# Patient Record
Sex: Female | Born: 1965
Health system: Southern US, Community
[De-identification: ages and names within clinical notes are randomized; demographics above are authoritative.]

## PROBLEM LIST (undated history)

## (undated) DIAGNOSIS — K589 Irritable bowel syndrome without diarrhea: Secondary | ICD-10-CM

## (undated) DIAGNOSIS — K760 Fatty (change of) liver, not elsewhere classified: Secondary | ICD-10-CM

## (undated) DIAGNOSIS — K8020 Calculus of gallbladder without cholecystitis without obstruction: Secondary | ICD-10-CM

## (undated) DIAGNOSIS — N83209 Unspecified ovarian cyst, unspecified side: Secondary | ICD-10-CM

## (undated) DIAGNOSIS — E119 Type 2 diabetes mellitus without complications: Secondary | ICD-10-CM

## (undated) DIAGNOSIS — K5792 Diverticulitis of intestine, part unspecified, without perforation or abscess without bleeding: Secondary | ICD-10-CM

## (undated) DIAGNOSIS — J45909 Unspecified asthma, uncomplicated: Secondary | ICD-10-CM

## (undated) HISTORY — DX: Fatty (change of) liver, not elsewhere classified: K76.0

## (undated) HISTORY — PX: ABDOMINAL HYSTERECTOMY: SHX81

## (undated) HISTORY — PX: COLONOSCOPY: SHX174

## (undated) HISTORY — DX: Calculus of gallbladder without cholecystitis without obstruction: K80.20

## (undated) HISTORY — PX: CHOLECYSTECTOMY: SHX55

## (undated) HISTORY — DX: Irritable bowel syndrome, unspecified: K58.9

---

## 1999-12-02 ENCOUNTER — Emergency Department (HOSPITAL_COMMUNITY): Admission: EM | Admit: 1999-12-02 | Discharge: 1999-12-02 | Payer: Self-pay | Admitting: Emergency Medicine

## 1999-12-04 ENCOUNTER — Encounter (HOSPITAL_BASED_OUTPATIENT_CLINIC_OR_DEPARTMENT_OTHER): Payer: Self-pay | Admitting: General Surgery

## 1999-12-04 ENCOUNTER — Ambulatory Visit (HOSPITAL_COMMUNITY): Admission: RE | Admit: 1999-12-04 | Discharge: 1999-12-05 | Payer: Self-pay | Admitting: General Surgery

## 2000-09-01 ENCOUNTER — Encounter: Payer: Self-pay | Admitting: Emergency Medicine

## 2000-09-01 ENCOUNTER — Emergency Department (HOSPITAL_COMMUNITY): Admission: EM | Admit: 2000-09-01 | Discharge: 2000-09-01 | Payer: Self-pay | Admitting: Emergency Medicine

## 2000-12-13 ENCOUNTER — Encounter: Payer: Self-pay | Admitting: Internal Medicine

## 2000-12-13 ENCOUNTER — Emergency Department (HOSPITAL_COMMUNITY): Admission: EM | Admit: 2000-12-13 | Discharge: 2000-12-13 | Payer: Self-pay | Admitting: Emergency Medicine

## 2001-01-17 ENCOUNTER — Ambulatory Visit (HOSPITAL_COMMUNITY): Admission: RE | Admit: 2001-01-17 | Discharge: 2001-01-17 | Payer: Self-pay | Admitting: Gastroenterology

## 2001-01-21 ENCOUNTER — Ambulatory Visit (HOSPITAL_COMMUNITY): Admission: RE | Admit: 2001-01-21 | Discharge: 2001-01-21 | Payer: Self-pay | Admitting: Gastroenterology

## 2001-01-21 ENCOUNTER — Encounter: Payer: Self-pay | Admitting: Gastroenterology

## 2001-02-16 ENCOUNTER — Other Ambulatory Visit: Admission: RE | Admit: 2001-02-16 | Discharge: 2001-02-16 | Payer: Self-pay | Admitting: Obstetrics and Gynecology

## 2001-02-25 ENCOUNTER — Encounter: Admission: RE | Admit: 2001-02-25 | Discharge: 2001-02-25 | Payer: Self-pay | Admitting: Obstetrics and Gynecology

## 2001-02-25 ENCOUNTER — Encounter: Payer: Self-pay | Admitting: Obstetrics and Gynecology

## 2001-08-10 ENCOUNTER — Other Ambulatory Visit: Admission: RE | Admit: 2001-08-10 | Discharge: 2001-08-10 | Payer: Self-pay | Admitting: Gynecology

## 2001-09-05 ENCOUNTER — Ambulatory Visit (HOSPITAL_COMMUNITY): Admission: RE | Admit: 2001-09-05 | Discharge: 2001-09-05 | Payer: Self-pay | Admitting: Gynecology

## 2002-02-09 ENCOUNTER — Inpatient Hospital Stay (HOSPITAL_COMMUNITY): Admission: RE | Admit: 2002-02-09 | Discharge: 2002-02-11 | Payer: Self-pay | Admitting: Gynecology

## 2002-02-15 ENCOUNTER — Observation Stay (HOSPITAL_COMMUNITY): Admission: AD | Admit: 2002-02-15 | Discharge: 2002-02-16 | Payer: Self-pay | Admitting: Gynecology

## 2002-11-15 ENCOUNTER — Encounter: Admission: RE | Admit: 2002-11-15 | Discharge: 2003-01-24 | Payer: Self-pay | Admitting: Orthopedic Surgery

## 2003-09-20 ENCOUNTER — Ambulatory Visit (HOSPITAL_COMMUNITY): Admission: RE | Admit: 2003-09-20 | Discharge: 2003-09-21 | Payer: Self-pay | Admitting: Neurosurgery

## 2005-05-10 IMAGING — CR DG CHEST 2V
2 series · 2 of 2 positions shown · non-contrast
Comparison: 12/04/1999.

CLINICAL DATA: Preop herniated nucleus pulposus.  
 CHEST (TWO VIEWS) ? 09/18/2003 AT 6033 HOURS

[view not recorded (1 of 2)]
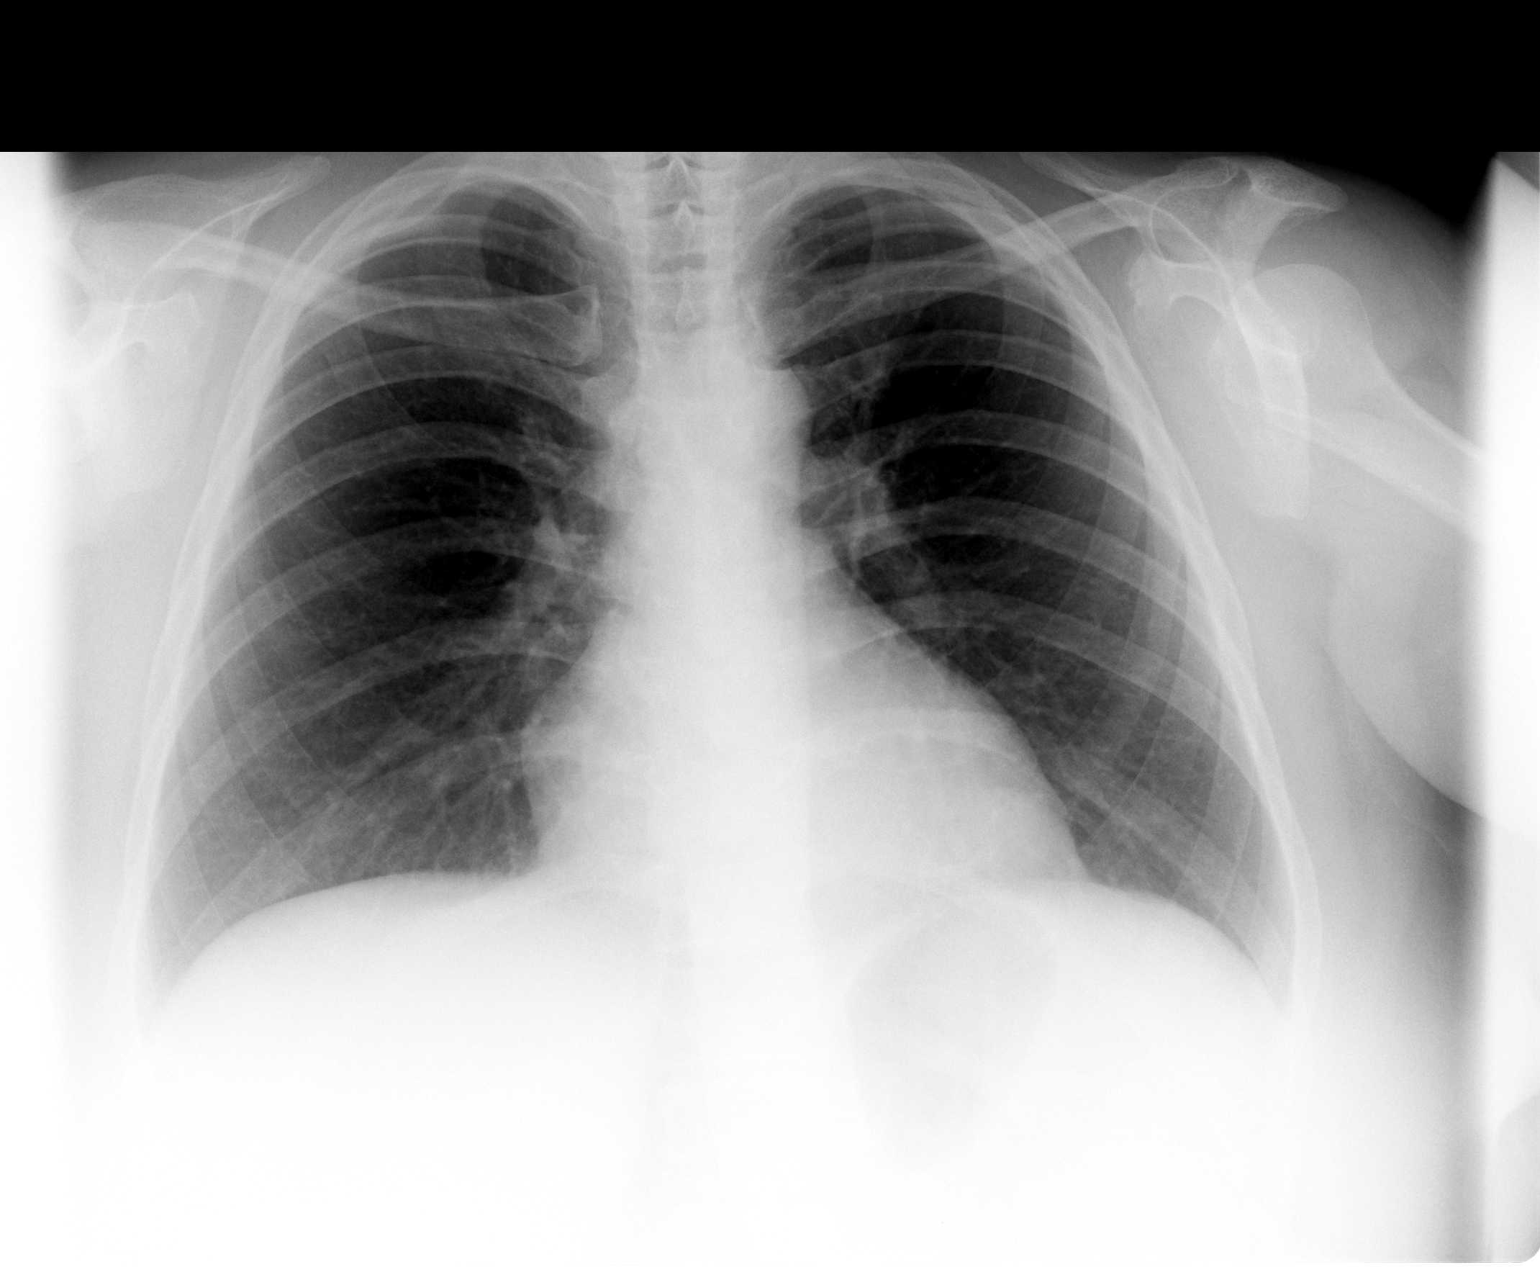

[view not recorded (2 of 2)]
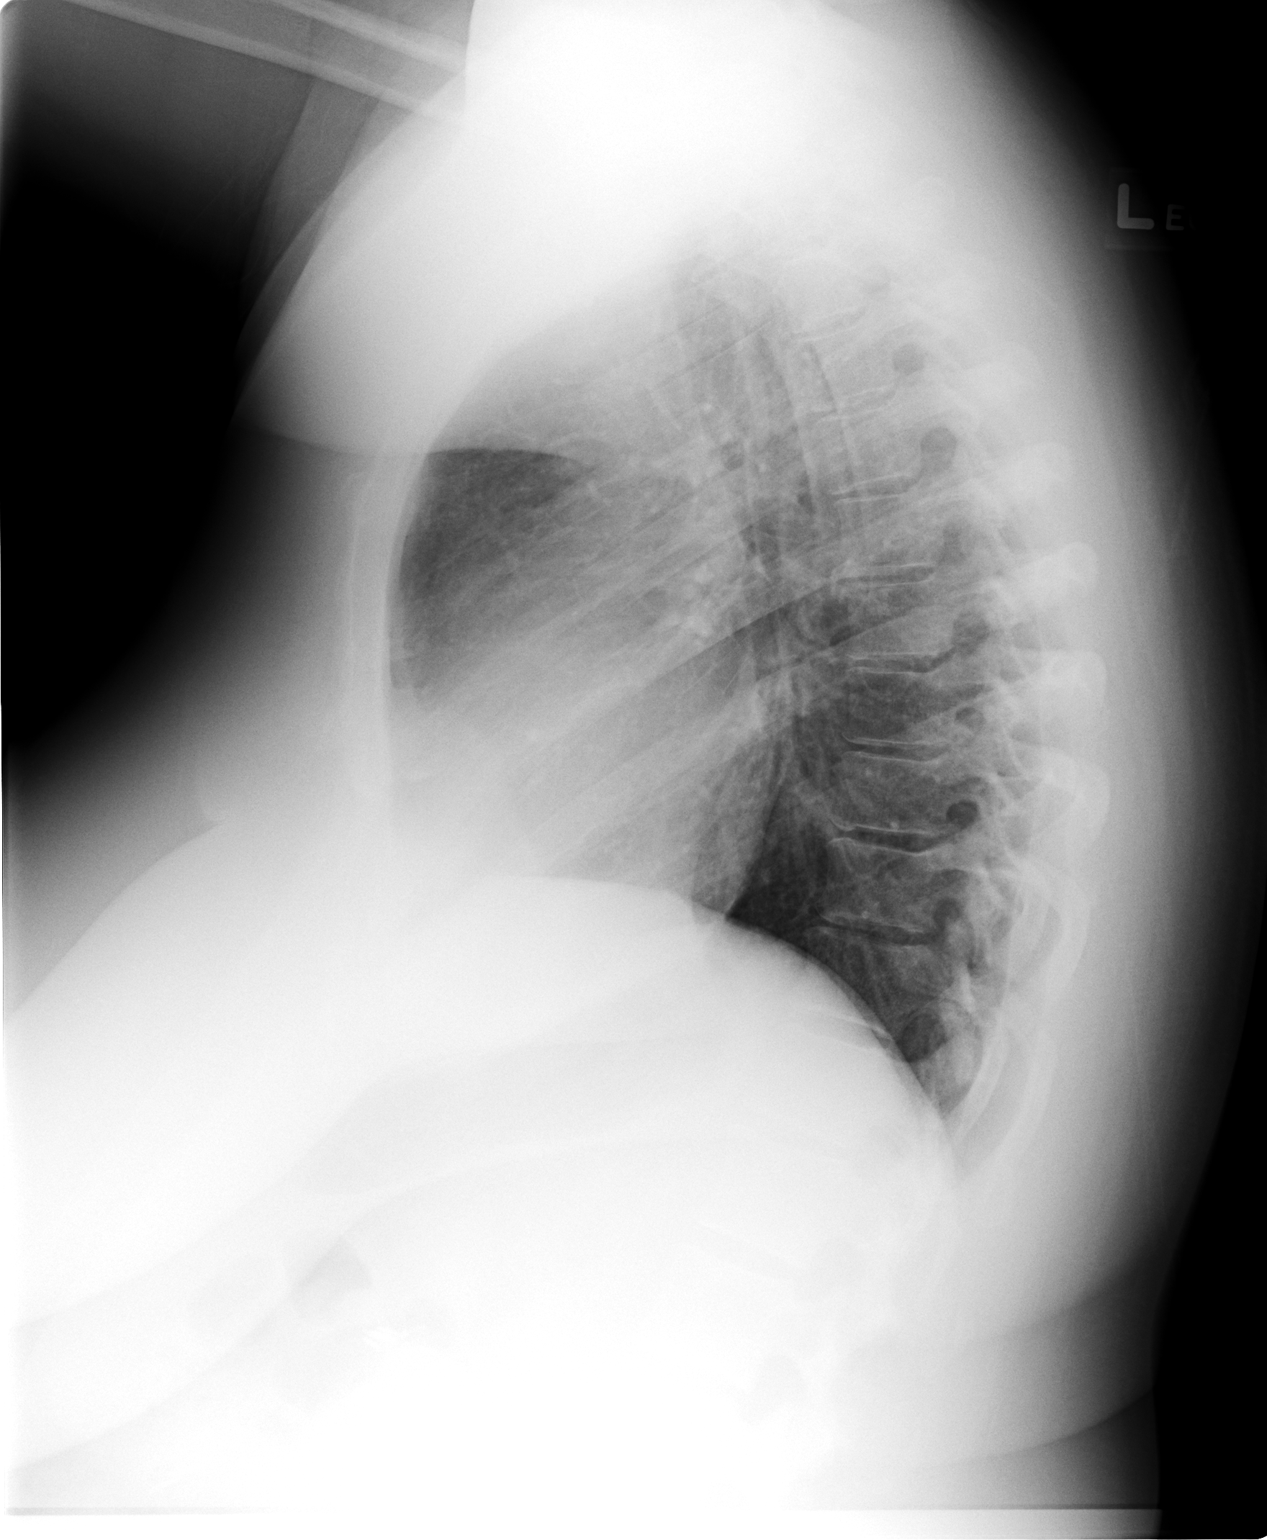

[2 of 2 positions shown; findings below may reference images not displayed]

The heart size and mediastinal contours are unremarkable.  The lungs are clear.  The visualized skeleton is unremarkable.  
 IMPRESSION
 No active disease.

## 2010-06-30 ENCOUNTER — Encounter: Payer: Self-pay | Admitting: Gastroenterology

## 2012-04-28 ENCOUNTER — Emergency Department (HOSPITAL_COMMUNITY): Payer: Managed Care, Other (non HMO)

## 2012-04-28 ENCOUNTER — Encounter (HOSPITAL_COMMUNITY): Payer: Self-pay | Admitting: *Deleted

## 2012-04-28 ENCOUNTER — Emergency Department (HOSPITAL_COMMUNITY)
Admission: EM | Admit: 2012-04-28 | Discharge: 2012-04-28 | Disposition: A | Discharge status: Home / Self Care | Payer: Managed Care, Other (non HMO) | Attending: Emergency Medicine | Admitting: Emergency Medicine

## 2012-04-28 DIAGNOSIS — Z8742 Personal history of other diseases of the female genital tract: Secondary | ICD-10-CM | POA: Insufficient documentation

## 2012-04-28 DIAGNOSIS — R197 Diarrhea, unspecified: Secondary | ICD-10-CM | POA: Insufficient documentation

## 2012-04-28 DIAGNOSIS — J45909 Unspecified asthma, uncomplicated: Secondary | ICD-10-CM | POA: Insufficient documentation

## 2012-04-28 DIAGNOSIS — Z79899 Other long term (current) drug therapy: Secondary | ICD-10-CM | POA: Insufficient documentation

## 2012-04-28 DIAGNOSIS — E119 Type 2 diabetes mellitus without complications: Secondary | ICD-10-CM | POA: Insufficient documentation

## 2012-04-28 DIAGNOSIS — Z87891 Personal history of nicotine dependence: Secondary | ICD-10-CM | POA: Insufficient documentation

## 2012-04-28 DIAGNOSIS — K5732 Diverticulitis of large intestine without perforation or abscess without bleeding: Secondary | ICD-10-CM

## 2012-04-28 HISTORY — DX: Unspecified ovarian cyst, unspecified side: N83.209

## 2012-04-28 HISTORY — DX: Unspecified asthma, uncomplicated: J45.909

## 2012-04-28 HISTORY — DX: Type 2 diabetes mellitus without complications: E11.9

## 2012-04-28 LAB — URINALYSIS, ROUTINE W REFLEX MICROSCOPIC
Bilirubin Urine: NEGATIVE
Glucose, UA: 250 mg/dL — AB
Hgb urine dipstick: NEGATIVE
Ketones, ur: NEGATIVE mg/dL
Leukocytes, UA: NEGATIVE
Nitrite: NEGATIVE
Protein, ur: NEGATIVE mg/dL
Specific Gravity, Urine: 1.013 (ref 1.005–1.030)
Urobilinogen, UA: 0.2 mg/dL (ref 0.0–1.0)
pH: 5.5 (ref 5.0–8.0)

## 2012-04-28 LAB — CBC WITH DIFFERENTIAL/PLATELET
Basophils Absolute: 0 10*3/uL (ref 0.0–0.1)
Basophils Relative: 0 % (ref 0–1)
Eosinophils Absolute: 0.2 10*3/uL (ref 0.0–0.7)
Eosinophils Relative: 1 % (ref 0–5)
HCT: 43.2 % (ref 36.0–46.0)
Hemoglobin: 15 g/dL (ref 12.0–15.0)
Lymphocytes Relative: 16 % (ref 12–46)
Lymphs Abs: 1.8 K/uL (ref 0.7–4.0)
MCH: 29.5 pg (ref 26.0–34.0)
MCHC: 34.7 g/dL (ref 30.0–36.0)
MCV: 85 fL (ref 78.0–100.0)
Monocytes Absolute: 0.5 10*3/uL (ref 0.1–1.0)
Monocytes Relative: 4 % (ref 3–12)
Neutro Abs: 8.7 K/uL — ABNORMAL HIGH (ref 1.7–7.7)
Neutrophils Relative %: 78 % — ABNORMAL HIGH (ref 43–77)
Platelets: 187 10*3/uL (ref 150–400)
RBC: 5.08 MIL/uL (ref 3.87–5.11)
RDW: 12.8 % (ref 11.5–15.5)
WBC: 11.1 10*3/uL — ABNORMAL HIGH (ref 4.0–10.5)

## 2012-04-28 LAB — COMPREHENSIVE METABOLIC PANEL
ALT: 50 U/L — ABNORMAL HIGH (ref 0–35)
AST: 39 U/L — ABNORMAL HIGH (ref 0–37)
Albumin: 3.5 g/dL (ref 3.5–5.2)
Calcium: 9 mg/dL (ref 8.4–10.5)
Creatinine, Ser: 0.64 mg/dL (ref 0.50–1.10)
GFR calc non Af Amer: >90 mL/min (ref >90)
Sodium: 134 mEq/L — ABNORMAL LOW (ref 135–145)
Total Protein: 6.9 g/dL (ref 6.0–8.3)

## 2012-04-28 LAB — COMPREHENSIVE METABOLIC PANEL WITH GFR
Alkaline Phosphatase: 113 U/L (ref 39–117)
BUN: 11 mg/dL (ref 6–23)
CO2: 25 meq/L (ref 19–32)
Chloride: 98 meq/L (ref 96–112)
GFR calc Af Amer: >90 mL/min (ref >90)
Glucose, Bld: 262 mg/dL — ABNORMAL HIGH (ref 70–99)
Potassium: 3.8 meq/L (ref 3.5–5.1)
Total Bilirubin: 0.7 mg/dL (ref 0.3–1.2)

## 2012-04-28 LAB — LIPASE, BLOOD: Lipase: 51 U/L (ref 11–59)

## 2012-04-28 LAB — GLUCOSE, CAPILLARY: Glucose-Capillary: 211 mg/dL — ABNORMAL HIGH (ref 70–99)

## 2012-04-28 MED ORDER — TRAMADOL HCL 50 MG PO TABS: 50.0000 mg | ORAL_TABLET | Freq: Four times a day (QID) | ORAL | Status: DC | PRN

## 2012-04-28 MED ORDER — SODIUM CHLORIDE 0.9 % IV SOLN
Freq: Once | INTRAVENOUS | Status: AC
  Administered 2012-04-28: 09:00:00 via INTRAVENOUS

## 2012-04-28 MED ORDER — TRAMADOL HCL 50 MG PO TABS
100.0000 mg | ORAL_TABLET | Freq: Once | ORAL | Status: AC
  Administered 2012-04-28: 100 mg via ORAL
  Filled 2012-04-28 (×2): qty 1

## 2012-04-28 MED ORDER — AMOXICILLIN-POT CLAVULANATE 250-62.5 MG/5ML PO SUSR: 875.0000 mg | Freq: Two times a day (BID) | ORAL | Status: AC

## 2012-04-28 MED ORDER — SODIUM CHLORIDE 0.9 % IV SOLN
3.0000 g | Freq: Once | INTRAVENOUS | Status: AC
  Administered 2012-04-28: 3 g via INTRAVENOUS
  Filled 2012-04-28: qty 3

## 2012-04-28 MED ORDER — HYDROMORPHONE HCL PF 1 MG/ML IJ SOLN: 1.0000 mg | Freq: Once | INTRAMUSCULAR | Status: DC

## 2012-04-28 NOTE — ED Notes (Signed)
Pt reports cramping lower abd pain since last night. +nausea, diarrhea. denies vomiting. Hx of same. Was on belladonna, now dicyclomine. Decreased appetite.

## 2012-04-28 NOTE — Discharge Instructions (Signed)
Diverticulitis °A diverticulum is a small pouch or sac on the colon. Diverticulosis is the presence of these diverticula on the colon. Diverticulitis is the irritation (inflammation) or infection of diverticula. °CAUSES  °The colon and its diverticula contain bacteria. If food particles block the tiny opening to a diverticulum, the bacteria inside can grow and cause an increase in pressure. This leads to infection and inflammation and is called diverticulitis. °SYMPTOMS  °· Abdominal pain and tenderness. Usually, the pain is located on the left side of your abdomen. However, it could be located elsewhere. °· Fever. °· Bloating. °· Feeling sick to your stomach (nausea). °· Throwing up (vomiting). °· Abnormal stools. °DIAGNOSIS  °Your caregiver will take a history and perform a physical exam. Since many things can cause abdominal pain, other tests may be necessary. Tests may include: °· Blood tests. °· Urine tests. °· X-ray of the abdomen. °· CT scan of the abdomen. °Sometimes, surgery is needed to determine if diverticulitis or other conditions are causing your symptoms. °TREATMENT  °Most of the time, you can be treated without surgery. Treatment includes: °· Resting the bowels by only having liquids for a few days. As you improve, you will need to eat a low-fiber diet. °· Intravenous (IV) fluids if you are losing body fluids (dehydrated). °· Antibiotic medicines that treat infections may be given. °· Pain and nausea medicine, if needed. °· Surgery if the inflamed diverticulum has burst. °HOME CARE INSTRUCTIONS  °· Try a clear liquid diet (broth, tea, or water for as long as directed by your caregiver). You may then gradually begin a low-fiber diet as tolerated. A low-fiber diet is a diet with less than 10 grams of fiber. Choose the foods below to reduce fiber in the diet: °· White breads, cereals, rice, and pasta. °· Cooked fruits and vegetables or soft fresh fruits and vegetables without the skin. °· Ground or  well-cooked tender beef, ham, veal, lamb, pork, or poultry. °· Eggs and seafood. °· After your diverticulitis symptoms have improved, your caregiver may put you on a high-fiber diet. A high-fiber diet includes 14 grams of fiber for every 1000 calories consumed. For a standard 2000 calorie diet, you would need 28 grams of fiber. Follow these diet guidelines to help you increase the fiber in your diet. It is important to slowly increase the amount fiber in your diet to avoid gas, constipation, and bloating. °· Choose whole-grain breads, cereals, pasta, and brown rice. °· Choose fresh fruits and vegetables with the skin on. Do not overcook vegetables because the more vegetables are cooked, the more fiber is lost. °· Choose more nuts, seeds, legumes, dried peas, beans, and lentils. °· Look for food products that have greater than 3 grams of fiber per serving on the Nutrition Facts label. °· Take all medicine as directed by your caregiver. °· If your caregiver has given you a follow-up appointment, it is very important that you go. Not going could result in lasting (chronic) or permanent injury, pain, and disability. If there is any problem keeping the appointment, call to reschedule. °SEEK MEDICAL CARE IF:  °· Your pain does not improve. °· You have a hard time advancing your diet beyond clear liquids. °· Your bowel movements do not return to normal. °SEEK IMMEDIATE MEDICAL CARE IF:  °· Your pain becomes worse. °· You have an oral temperature above 102° F (38.9° C), not controlled by medicine. °· You have repeated vomiting. °· You have bloody or black, tarry stools. °· Symptoms   that brought you to your caregiver become worse or are not getting better. °MAKE SURE YOU:  °· Understand these instructions. °· Will watch your condition. °· Will get help right away if you are not doing well or get worse. °Document Released: 03/04/2005 Document Revised: 08/17/2011 Document Reviewed: 06/30/2010 °[employer]® Patient Information  ©2013 [employer]. ° °

## 2012-04-28 NOTE — ED Provider Notes (Signed)
History     CSN: [id number]  Arrival date & time 04/28/12  [id number]   First MD Initiated Contact with Patient 04/28/12 0801      Chief Complaint  Patient presents with  . Abdominal Pain    (Consider location/radiation/quality/duration/timing/severity/associated sxs/prior treatment) HPI Comments: Pt reports exacerbation of lower abdominal pain since yesterday that she has had previously, but not in a while. Mostly in lower abd, middle, a little more to left side.  No CP, back pain.  Not worse with eating.  Mostly, she can bear it and not come to the hospital, but this is a little worse than typical.  Has been seen by GI in the past, had endoscopy and colonoscopy years ago, told nothing wrong.  She was told diverticulitis once by CT in the past.  Also told she had ovarian cysts, but then later told by a GYN in [place] that she didn't.  Hasn't seen a GYN in about 2-3 years, no menstrucal cycles now.  Only prior surgery was C/S.  Pt admits to eating a lot of peanuts recently.  No fevers, appetite is down, no N/V.  Has had freq diarrhea yesterday, has since slowed down, color is an odd brown, no blood.   No dysuria, flank pain, hematuria, freq.  Pt reports with a prior episode, did have a large amount of rectal bleeding in the past, but not this time.  She has been taking more BC powder after a leg injury with some chronic pain.    Patient is a 46 y.o. female presenting with abdominal pain. The history is provided by the patient and the spouse.  Abdominal Pain The primary symptoms of the illness include abdominal pain and diarrhea. The primary symptoms of the illness do not include fever, shortness of breath, nausea, vomiting, dysuria, vaginal discharge or vaginal bleeding.  Symptoms associated with the illness do not include chills, frequency or back pain.    Past Medical History  Diagnosis Date  . Diabetes mellitus without complication   . Ovarian cyst   . Asthma     Past Surgical History    Procedure Date  . Cesarean section     History reviewed. No pertinent family history.  History  Substance Use Topics  . Smoking status: Former Research scientist (life sciences)  . Smokeless tobacco: Never Used  . Alcohol Use: Yes     Comment: rarely    OB History    Grav Para Term Preterm Abortions TAB SAB Ect Mult Living                  Review of Systems  Constitutional: Positive for appetite change. Negative for fever and chills.  Respiratory: Negative for chest tightness and shortness of breath.   Cardiovascular: Negative for chest pain.  Gastrointestinal: Positive for abdominal pain and diarrhea. Negative for nausea, vomiting and anal bleeding.  Genitourinary: Negative for dysuria, frequency, flank pain, vaginal bleeding, vaginal discharge and pelvic pain.  Musculoskeletal: Negative for back pain.  All other systems reviewed and are negative.    Allergies  Sulfa antibiotics  Home Medications   Current Outpatient Rx  Name  Route  Sig  Dispense  Refill  . ASPIRIN POWD   Does not apply   by Does not apply route.         Marland Kitchen CETIRIZINE HCL 10 MG PO TABS   Oral   Take 10 mg by mouth daily.         . IBUPROFEN 200 MG PO TABS  Oral   Take 600 mg by mouth every 6 (six) hours as needed. Pain         . LIRAGLUTIDE 18 MG/3ML [place] SOLN   Subcutaneous   Inject 7.2 mg into the skin daily. Inject 1.2 ml in the morning         . AMOXICILLIN-POT CLAVULANATE 250-62.5 MG/5ML PO SUSR   Oral   Take 17.5 mLs (875 mg total) by mouth 2 (two) times daily.   350 mL   0   . TRAMADOL HCL 50 MG PO TABS   Oral   Take 1 tablet (50 mg total) by mouth every 6 (six) hours as needed for pain.   20 tablet   0     BP 138/86  Pulse 105  Temp 98.3 F (36.8 C) (Oral)  Resp 18  SpO2 98%  Physical Exam  Nursing note and vitals reviewed. Constitutional: She is oriented to person, place, and time. She appears well-developed and well-nourished. No distress.  HENT:  Head: Normocephalic and  atraumatic.  Eyes: No scleral icterus.  Neck: Neck supple.  Cardiovascular: Regular rhythm.   Pulmonary/Chest: Effort normal. No respiratory distress. She has no wheezes.  Abdominal: Soft. She exhibits no distension. There is tenderness. There is no rebound and no guarding.    Musculoskeletal: She exhibits no edema and no tenderness.  Neurological: She is alert and oriented to person, place, and time. She exhibits normal muscle tone.  Skin: Skin is warm and dry. She is not diaphoretic.  Psychiatric: She has a normal mood and affect.    ED Course  Procedures (including critical care time)  Labs Reviewed  CBC WITH DIFFERENTIAL - Abnormal; Notable for the following:    WBC 11.1 (*)     Neutrophils Relative 78 (*)     Neutro Abs 8.7 (*)     All other components within normal limits  COMPREHENSIVE METABOLIC PANEL - Abnormal; Notable for the following:    Sodium 134 (*)     Glucose, Bld 262 (*)     AST 39 (*)     ALT 50 (*)     All other components within normal limits  URINALYSIS, ROUTINE W REFLEX MICROSCOPIC - Abnormal; Notable for the following:    Glucose, UA 250 (*)     All other components within normal limits  GLUCOSE, CAPILLARY - Abnormal; Notable for the following:    Glucose-Capillary 211 (*)     All other components within normal limits  LIPASE, BLOOD   Ct Abdomen Pelvis Wo Contrast  04/28/2012  *RADIOLOGY REPORT*  Clinical Data: Left-sided abdominal pain  CT ABDOMEN AND PELVIS WITHOUT CONTRAST  Technique:  Multidetector CT imaging of the abdomen and pelvis was performed following the standard protocol without intravenous contrast.  Comparison: CT 11/11/2009  Findings: Thickening of the sigmoid colon with associated diverticula.  There is edema in the pericolonic fat.  Findings are compatible with diverticulitis.  No free air or free fluid. Negative for abscess.  Negative for bowel obstruction.  Lung bases are clear.  Fatty infiltration of the liver.  Prior  cholecystectomy.  Pancreas kidneys and spleen are normal.  No mass or adenopathy.  IMPRESSION: Sigmoid diverticulitis without abscess or perforation.   Original Report Authenticated By: Carl Best, M.D.      1. Diverticulitis of sigmoid colon     ra sat is 98% and normal by my interpretation.  12:08 PM I reviewed the CT scan results per radiologist. Patient has uncomplicated sigmoid diverticulitis.  This fits with my clinical suspicion. Patient reports that she has trouble taking pills. Plan is to give her liquid Augmentin to take for 9-10 days. Patient reports that tramadol is very effective for her and have given her a prescription for tramadol as well. Return instructions are discussed with patient and family. I've encouraged patient to follow up with her primary care physician next week as well. Abdoen is currently soft, non tender  MDM  Pt with history and exam consistent with diverticulitis.  Will treat pain, IVF's, obtain CT of abd and pelvis with contrast.          Saddie Benders. Dorna Mai, MD 04/28/12 1208

## 2012-09-28 ENCOUNTER — Emergency Department (HOSPITAL_BASED_OUTPATIENT_CLINIC_OR_DEPARTMENT_OTHER)
Admission: EM | Admit: 2012-09-28 | Discharge: 2012-09-28 | Disposition: A | Discharge status: Home / Self Care | Payer: Managed Care, Other (non HMO) | Attending: Emergency Medicine | Admitting: Emergency Medicine

## 2012-09-28 ENCOUNTER — Emergency Department (HOSPITAL_BASED_OUTPATIENT_CLINIC_OR_DEPARTMENT_OTHER): Payer: Managed Care, Other (non HMO)

## 2012-09-28 ENCOUNTER — Encounter (HOSPITAL_BASED_OUTPATIENT_CLINIC_OR_DEPARTMENT_OTHER): Payer: Self-pay | Admitting: *Deleted

## 2012-09-28 DIAGNOSIS — Z87891 Personal history of nicotine dependence: Secondary | ICD-10-CM | POA: Insufficient documentation

## 2012-09-28 DIAGNOSIS — W010XXA Fall on same level from slipping, tripping and stumbling without subsequent striking against object, initial encounter: Secondary | ICD-10-CM | POA: Insufficient documentation

## 2012-09-28 DIAGNOSIS — Y9389 Activity, other specified: Secondary | ICD-10-CM | POA: Insufficient documentation

## 2012-09-28 DIAGNOSIS — S8990XA Unspecified injury of unspecified lower leg, initial encounter: Secondary | ICD-10-CM | POA: Insufficient documentation

## 2012-09-28 DIAGNOSIS — Y929 Unspecified place or not applicable: Secondary | ICD-10-CM | POA: Insufficient documentation

## 2012-09-28 DIAGNOSIS — S99919A Unspecified injury of unspecified ankle, initial encounter: Secondary | ICD-10-CM | POA: Insufficient documentation

## 2012-09-28 DIAGNOSIS — E119 Type 2 diabetes mellitus without complications: Secondary | ICD-10-CM | POA: Insufficient documentation

## 2012-09-28 DIAGNOSIS — S8991XA Unspecified injury of right lower leg, initial encounter: Secondary | ICD-10-CM

## 2012-09-28 DIAGNOSIS — J45909 Unspecified asthma, uncomplicated: Secondary | ICD-10-CM | POA: Insufficient documentation

## 2012-09-28 DIAGNOSIS — Z8742 Personal history of other diseases of the female genital tract: Secondary | ICD-10-CM | POA: Insufficient documentation

## 2012-09-28 NOTE — ED Provider Notes (Signed)
History     CSN: [id number]  Arrival date & time 09/28/12  1656   First MD Initiated Contact with Patient 09/28/12 1927      Chief Complaint  Patient presents with  . Knee Pain    (Consider location/radiation/quality/duration/timing/severity/associated sxs/prior treatment) Patient is a 47 y.o. female presenting with knee pain. The history is provided by the patient.  Knee Pain Location:  Knee Time since incident:  2 weeks Injury: yes (Patient had injury where her shoes slipped, and since that time she has developed pain in the right knee. She feels the pain in the lateral aspect of the right knee. She has taken ibuprofen with some relief.)   Mechanism of injury comment:  Her foot slipped in her right shoe. Knee location:  R knee Pain details:    Quality:  Aching   Radiates to:  Does not radiate   Severity:  Moderate   Onset quality:  Gradual   Duration:  2 weeks   Timing:  Intermittent   Progression:  Worsening Chronicity:  New Dislocation: no   Foreign body present:  No foreign bodies Prior injury to area:  No Relieved by:  NSAIDs Worsened by:  Nothing tried Ineffective treatments:  None tried Risk factors: obesity     Past Medical History  Diagnosis Date  . Diabetes mellitus without complication   . Ovarian cyst   . Asthma     Past Surgical History  Procedure Laterality Date  . Cesarean section    . Abdominal hysterectomy      History reviewed. No pertinent family history.  History  Substance Use Topics  . Smoking status: Former Research scientist (life sciences)  . Smokeless tobacco: Never Used  . Alcohol Use: No     Comment: rarely    OB History   Grav Para Term Preterm Abortions TAB SAB Ect Mult Living                  Review of Systems  All other systems reviewed and are negative.    Allergies  Sulfa antibiotics  Home Medications   Current Outpatient Rx  Name  Route  Sig  Dispense  Refill  . Aspirin POWD   Does not apply   by Does not apply route.          . cetirizine (ZYRTEC) 10 MG tablet   Oral   Take 10 mg by mouth daily.         Marland Kitchen ibuprofen (ADVIL,MOTRIN) 200 MG tablet   Oral   Take 600 mg by mouth every 6 (six) hours as needed. Pain         . Liraglutide 18 MG/3ML SOLN   Subcutaneous   Inject 7.2 mg into the skin daily. Inject 1.2 ml in the morning         . traMADol (ULTRAM) 50 MG tablet   Oral   Take 1 tablet (50 mg total) by mouth every 6 (six) hours as needed for pain.   20 tablet   0     Pulse 84  Temp(Src) 97.9 F (36.6 C) (Oral)  Resp 16  Ht 5\' 3"  (1.6 m)  Wt 274 lb (124.286 kg)  BMI 48.55 kg/m2  SpO2 100%  Physical Exam  Nursing note and vitals reviewed. Constitutional: She is oriented to person, place, and time.  Obese middle-aged woman in mild distress, complaining of pain in the right knee.  Musculoskeletal:  Right knee has no visible the form. She is tender over the right  lateral joint line. She has a full range of motion of her right knee without catching. There is no ligamentous instability. She has intact pulses sensation and tendon function in the right foot.  Neurological: She is alert and oriented to person, place, and time.  No sensory or motor deficit.  Skin: Skin is warm and dry.  Psychiatric: She has a normal mood and affect. Her behavior is normal.    ED Course  Procedures (including critical care time)  Labs Reviewed - No data to display Dg Knee Complete 4 Views Right  09/28/2012  *RADIOLOGY REPORT*  Clinical Data: Right knee pain status post twisting injury 2 weeks ago.  Pain lateral side of patella.  RIGHT KNEE - COMPLETE 4+ VIEW  Comparison: 12/16/2011  Findings: Enthesopathic change at the patellar tendon origin and insertion.  Mild degenerative irregularity about the medial tibial spine.  No acute fracture or dislocation.  No joint effusion.  IMPRESSION: No acute osseous abnormality.   Original Report Authenticated By: Abigail Miyamoto, M.D.    7:49 PM Patient had physical  examination and x-rays of the right knee. She was advised to wear a knee immobilizer when up. She can take ibuprofen 600 mg 3 times a day with food or milk. She should have followup with orthopedist. Her husband prefers an orthopedist in [facility].  1. Knee injury, right, initial encounter         Mylinda Latina III, MD 09/28/12 [id number]

## 2012-09-28 NOTE — ED Notes (Signed)
PT c/o right knee pain w/o injury x 2 weeks ago

## 2013-03-09 ENCOUNTER — Other Ambulatory Visit (HOSPITAL_BASED_OUTPATIENT_CLINIC_OR_DEPARTMENT_OTHER): Payer: Self-pay | Admitting: *Deleted

## 2013-03-09 DIAGNOSIS — Z1231 Encounter for screening mammogram for malignant neoplasm of breast: Secondary | ICD-10-CM

## 2013-03-10 ENCOUNTER — Ambulatory Visit (HOSPITAL_BASED_OUTPATIENT_CLINIC_OR_DEPARTMENT_OTHER)
Admission: RE | Admit: 2013-03-10 | Discharge: 2013-03-10 | Disposition: A | Discharge status: Home / Self Care | Payer: Managed Care, Other (non HMO) | Source: Ambulatory Visit | Attending: Family | Admitting: Family

## 2013-03-10 ENCOUNTER — Other Ambulatory Visit (HOSPITAL_BASED_OUTPATIENT_CLINIC_OR_DEPARTMENT_OTHER): Payer: Self-pay | Admitting: Family

## 2013-03-10 DIAGNOSIS — Z1231 Encounter for screening mammogram for malignant neoplasm of breast: Secondary | ICD-10-CM

## 2013-12-19 IMAGING — CT CT ABD-PELV W/O CM
1 of 2 series · 16 of 32 positions shown, 20 images · non-contrast
Comparison: CT 11/11/2009

CLINICAL DATA: Left-sided abdominal pain

CT ABDOMEN AND PELVIS WITHOUT CONTRAST
TECHNIQUE: Multidetector CT imaging of the abdomen and pelvis was
performed following the standard protocol without intravenous
contrast.

[Series 2: abd/pel w/o · axial · non-contrast · 0.91mm/px · z∈[-426,-10]mm · 16 of 91 slices shown, 20 images]
[im 4/91  soft-tissue]
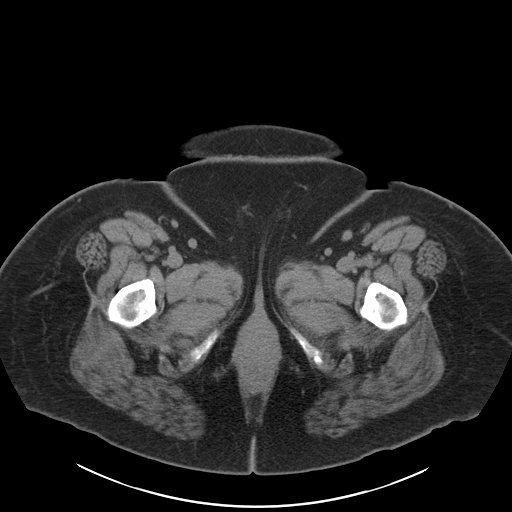
[im 4/91  bone]
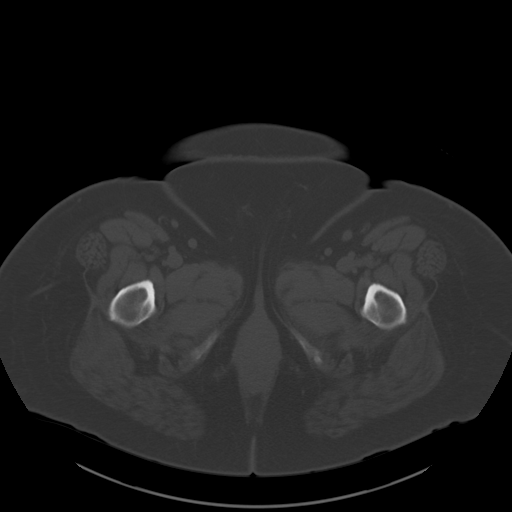
[im 11/91  soft-tissue]
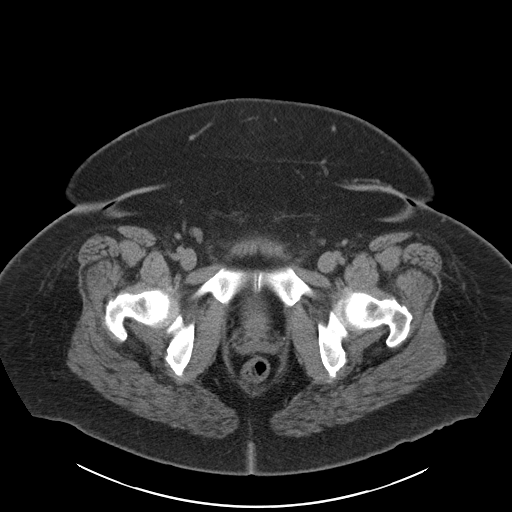
[im 19/91  soft-tissue]
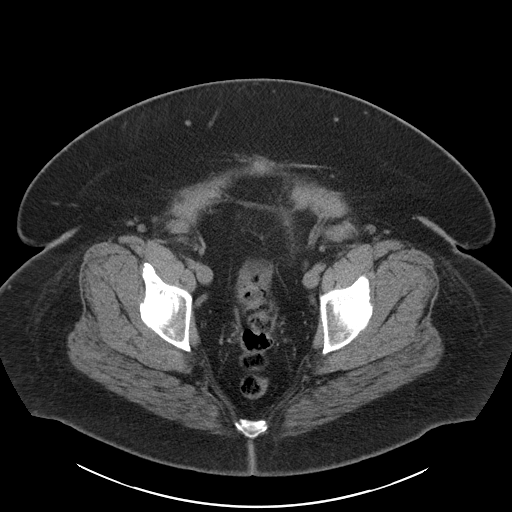
[im 26/91  soft-tissue]
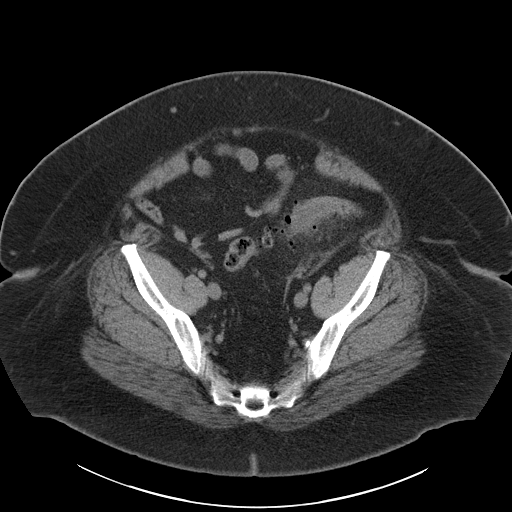
[im 29/91  soft-tissue]
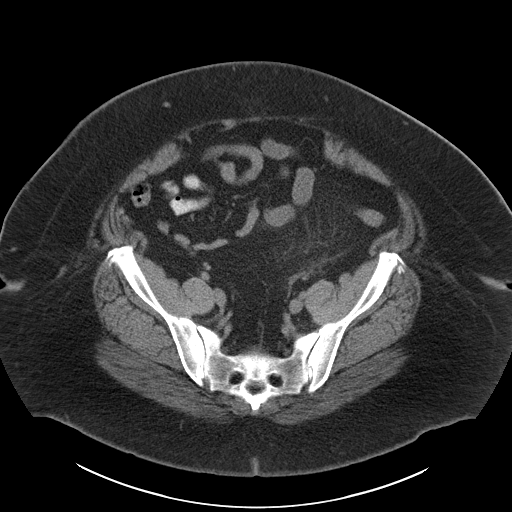
[im 37/91  soft-tissue]
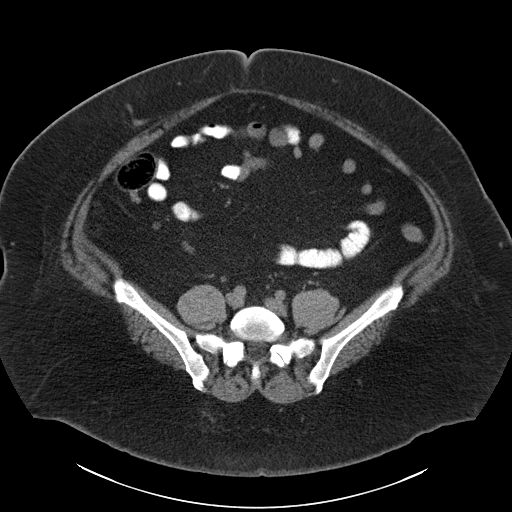
[im 44/91  soft-tissue]
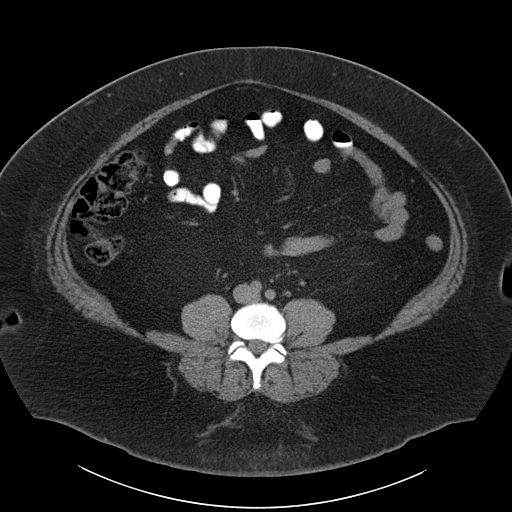
[im 47/91  soft-tissue]
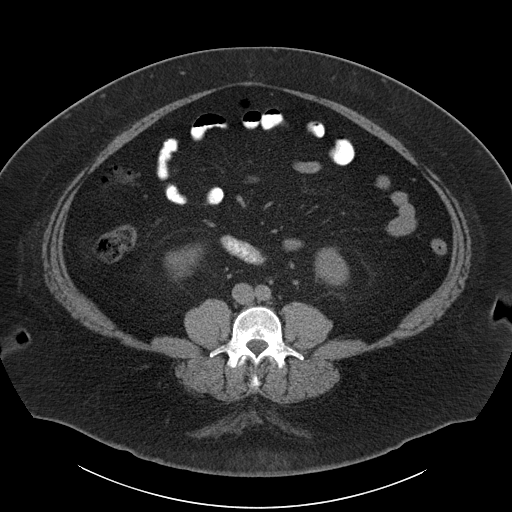
[im 55/91  soft-tissue]
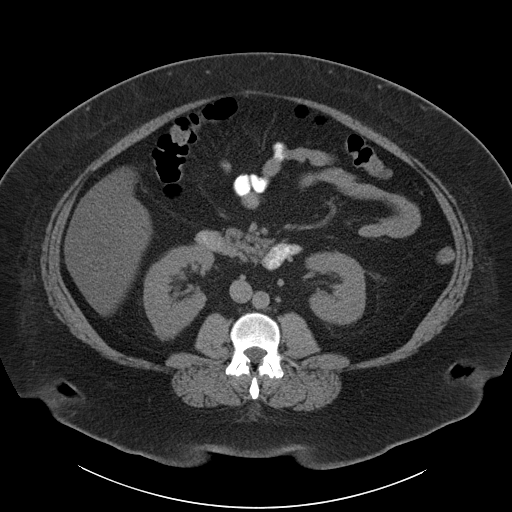
[im 55/91  bone]
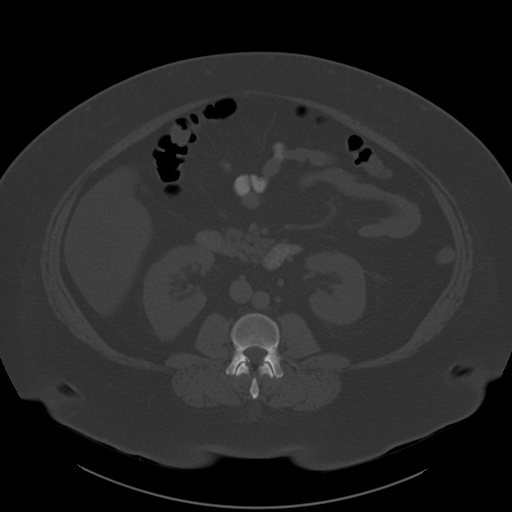
[im 62/91  soft-tissue]
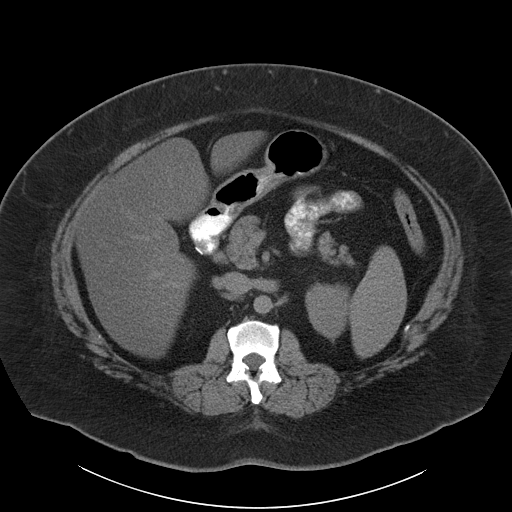
[im 69/91  soft-tissue]
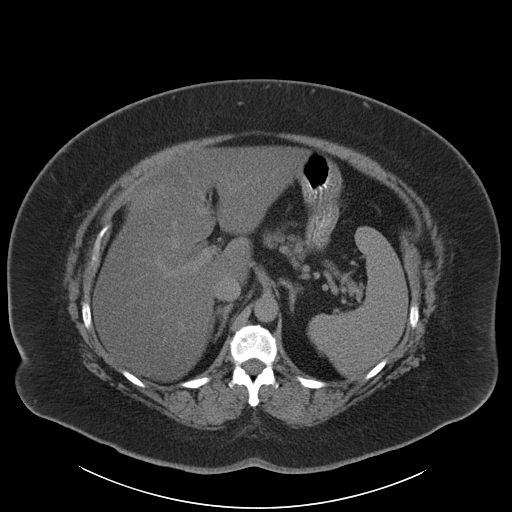
[im 73/91  soft-tissue]
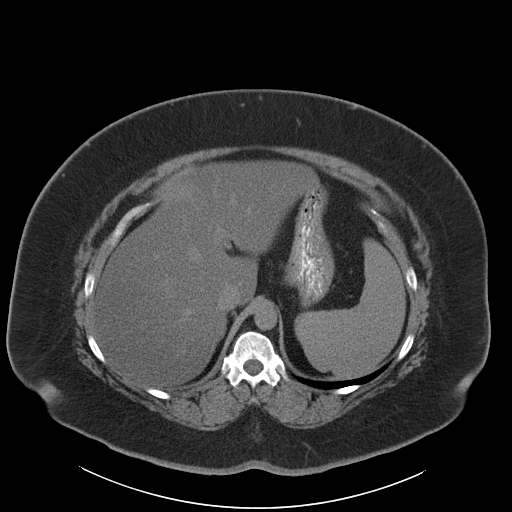
[im 76/91  lung]
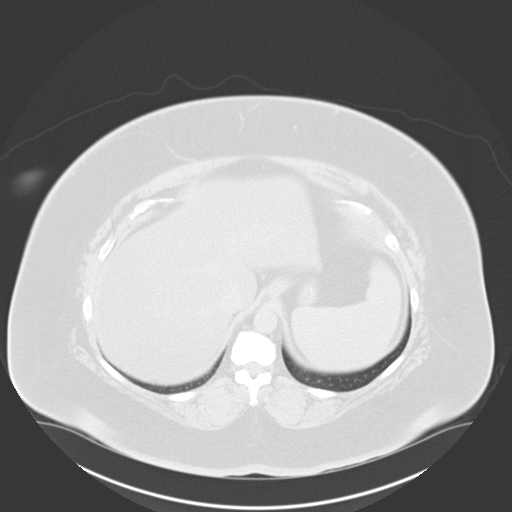
[im 80/91  soft-tissue]
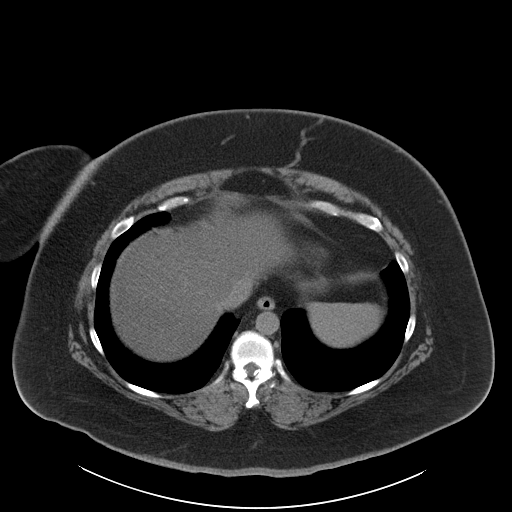
[im 80/91  lung]
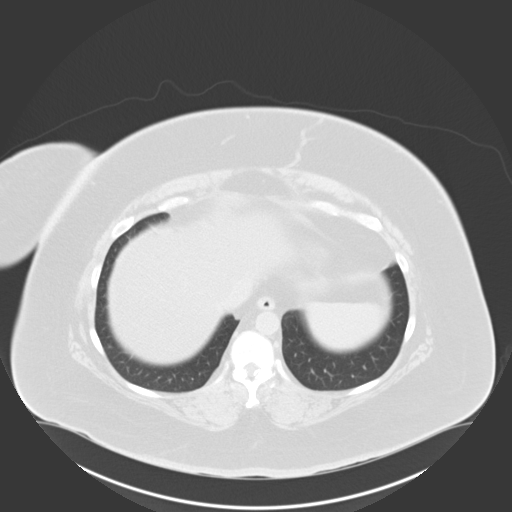
[im 83/91  lung]
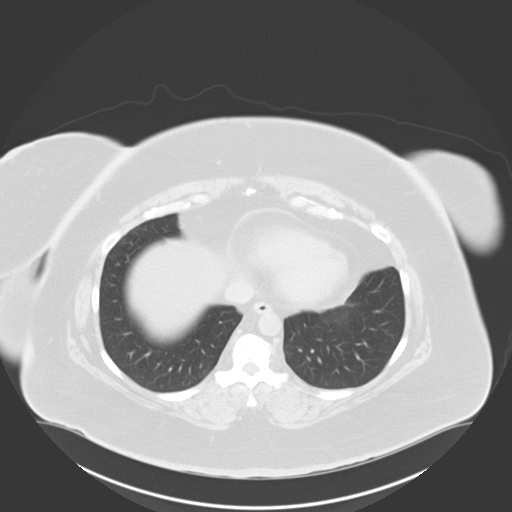
[im 87/91  soft-tissue]
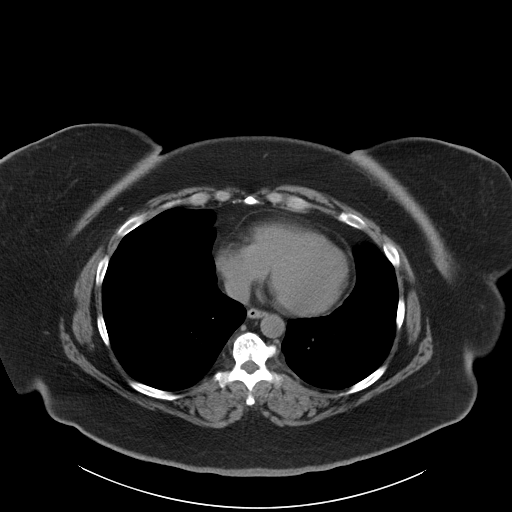
[im 87/91  lung]
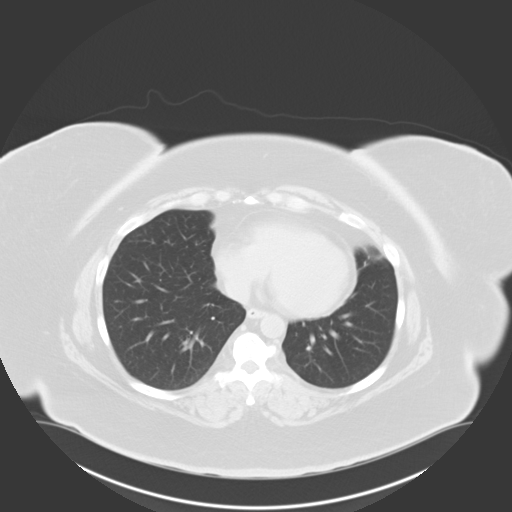

[16 of 32 positions shown; findings below may reference images not displayed]

FINDINGS: Thickening of the sigmoid colon with associated
diverticula.  There is edema in the pericolonic fat.  Findings are
compatible with diverticulitis.  No free air or free fluid.
Negative for abscess.  Negative for bowel obstruction.

Lung bases are clear.  Fatty infiltration of the liver.  Prior
cholecystectomy.  Pancreas kidneys and spleen are normal.  No mass
or adenopathy.
IMPRESSION: Sigmoid diverticulitis without abscess or perforation.

## 2014-03-19 ENCOUNTER — Other Ambulatory Visit (HOSPITAL_BASED_OUTPATIENT_CLINIC_OR_DEPARTMENT_OTHER): Payer: Self-pay | Admitting: Family Medicine

## 2014-03-19 DIAGNOSIS — Z1231 Encounter for screening mammogram for malignant neoplasm of breast: Secondary | ICD-10-CM

## 2014-03-20 ENCOUNTER — Ambulatory Visit (HOSPITAL_BASED_OUTPATIENT_CLINIC_OR_DEPARTMENT_OTHER)
Admission: RE | Admit: 2014-03-20 | Discharge: 2014-03-20 | Disposition: A | Discharge status: Home / Self Care | Payer: Managed Care, Other (non HMO) | Source: Ambulatory Visit | Attending: Family Medicine | Admitting: Family Medicine

## 2014-03-20 DIAGNOSIS — Z1231 Encounter for screening mammogram for malignant neoplasm of breast: Secondary | ICD-10-CM

## 2014-05-10 ENCOUNTER — Ambulatory Visit (HOSPITAL_BASED_OUTPATIENT_CLINIC_OR_DEPARTMENT_OTHER)
Admission: RE | Admit: 2014-05-10 | Discharge: 2014-05-10 | Disposition: A | Discharge status: Home / Self Care | Payer: 59 | Source: Ambulatory Visit | Attending: Emergency Medicine | Admitting: Emergency Medicine

## 2014-05-10 ENCOUNTER — Encounter (HOSPITAL_BASED_OUTPATIENT_CLINIC_OR_DEPARTMENT_OTHER): Payer: Self-pay

## 2014-05-10 ENCOUNTER — Other Ambulatory Visit (HOSPITAL_BASED_OUTPATIENT_CLINIC_OR_DEPARTMENT_OTHER): Payer: Self-pay | Admitting: Emergency Medicine

## 2014-05-10 DIAGNOSIS — R1032 Left lower quadrant pain: Secondary | ICD-10-CM

## 2014-05-10 DIAGNOSIS — K760 Fatty (change of) liver, not elsewhere classified: Secondary | ICD-10-CM | POA: Insufficient documentation

## 2014-05-10 MED ORDER — IOHEXOL 300 MG/ML  SOLN
100.0000 mL | Freq: Once | INTRAMUSCULAR | Status: AC | PRN
  Administered 2014-05-10: 100 mL via INTRAVENOUS

## 2014-05-21 IMAGING — CR DG KNEE COMPLETE 4+V*R*
4 series · 4 of 4 positions shown · non-contrast
Comparison: 12/16/2011

CLINICAL DATA: Right knee pain status post twisting injury 2 weeks
ago.  Pain lateral side of patella.

RIGHT KNEE - COMPLETE 4+ VIEW

[t knee ap right]
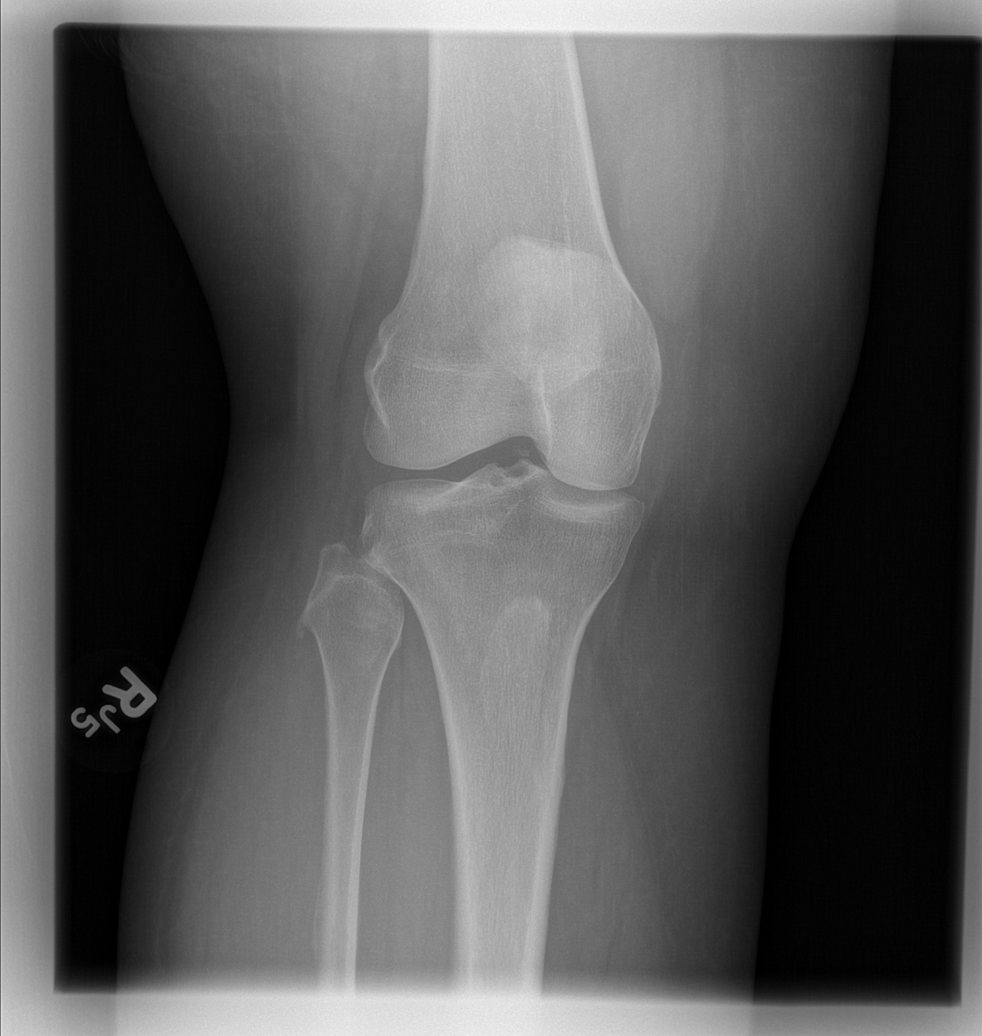

[t knee oblique right (1 of 2)]
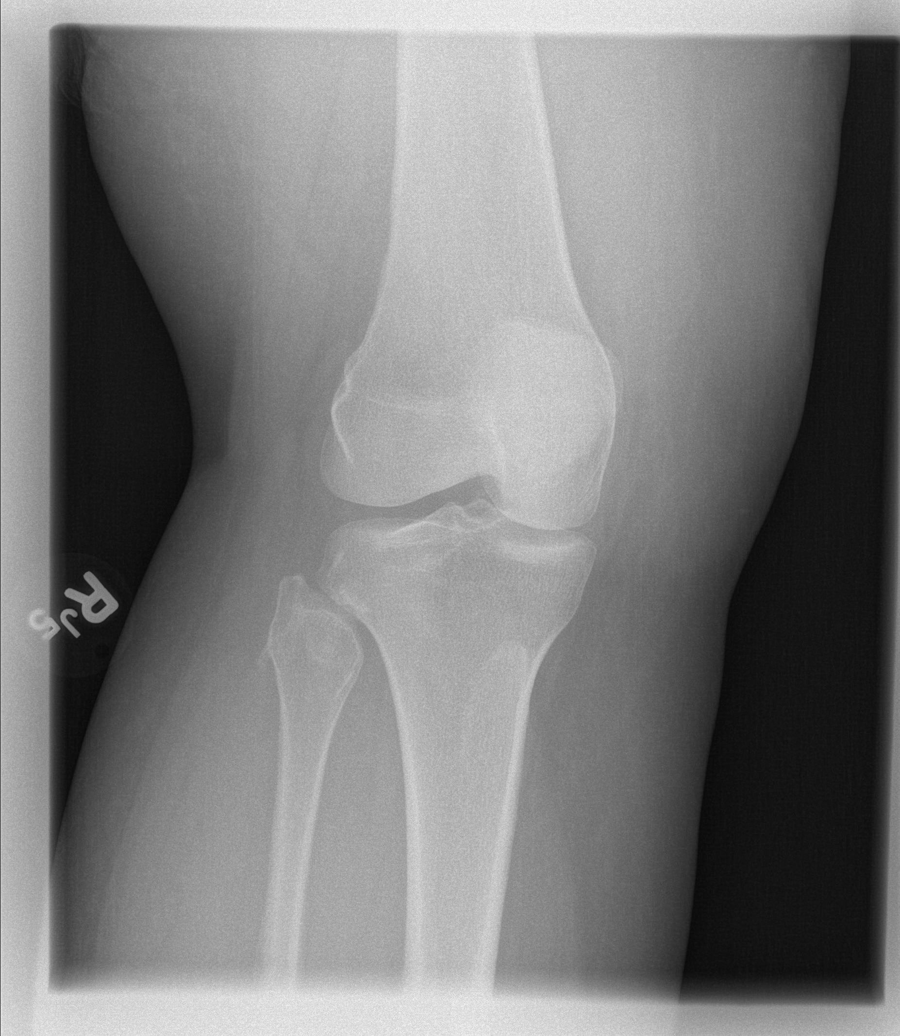

[t knee oblique right (2 of 2)]
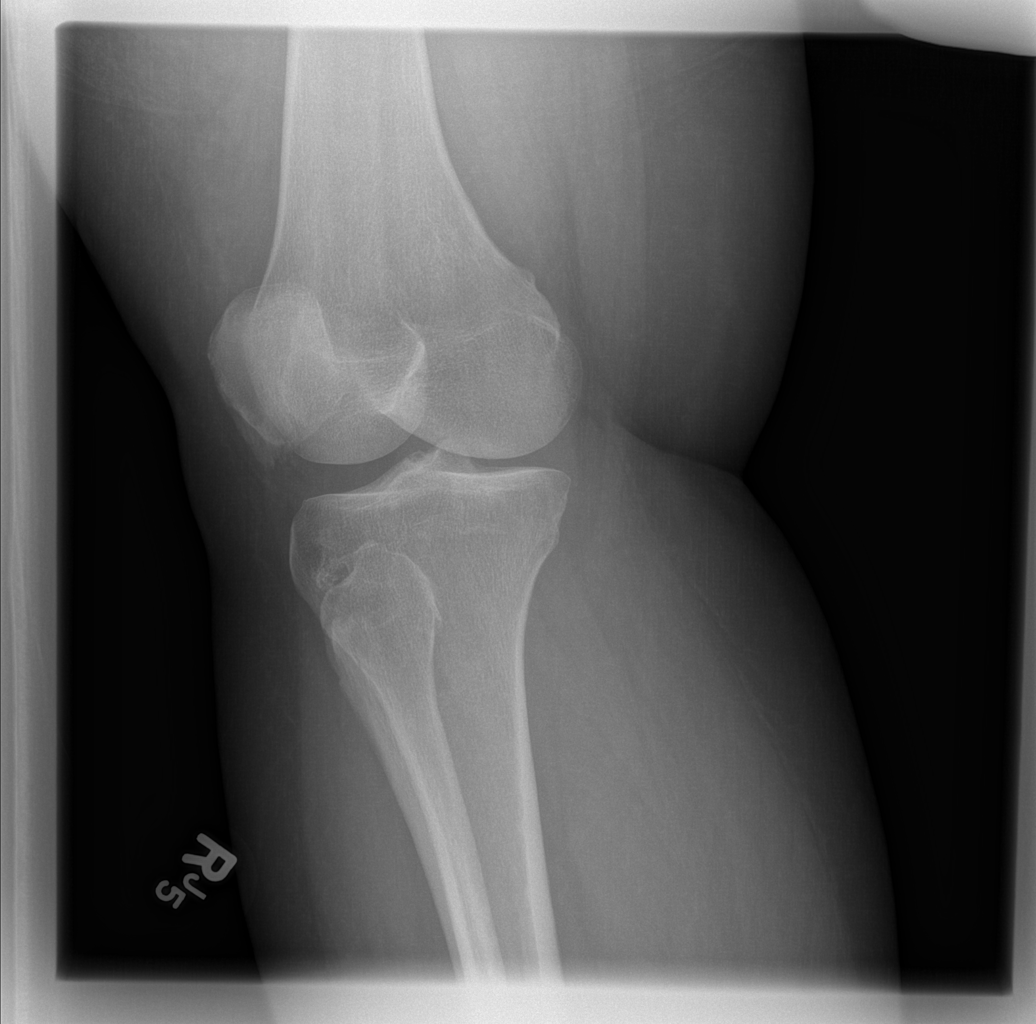

[t knee lat right]
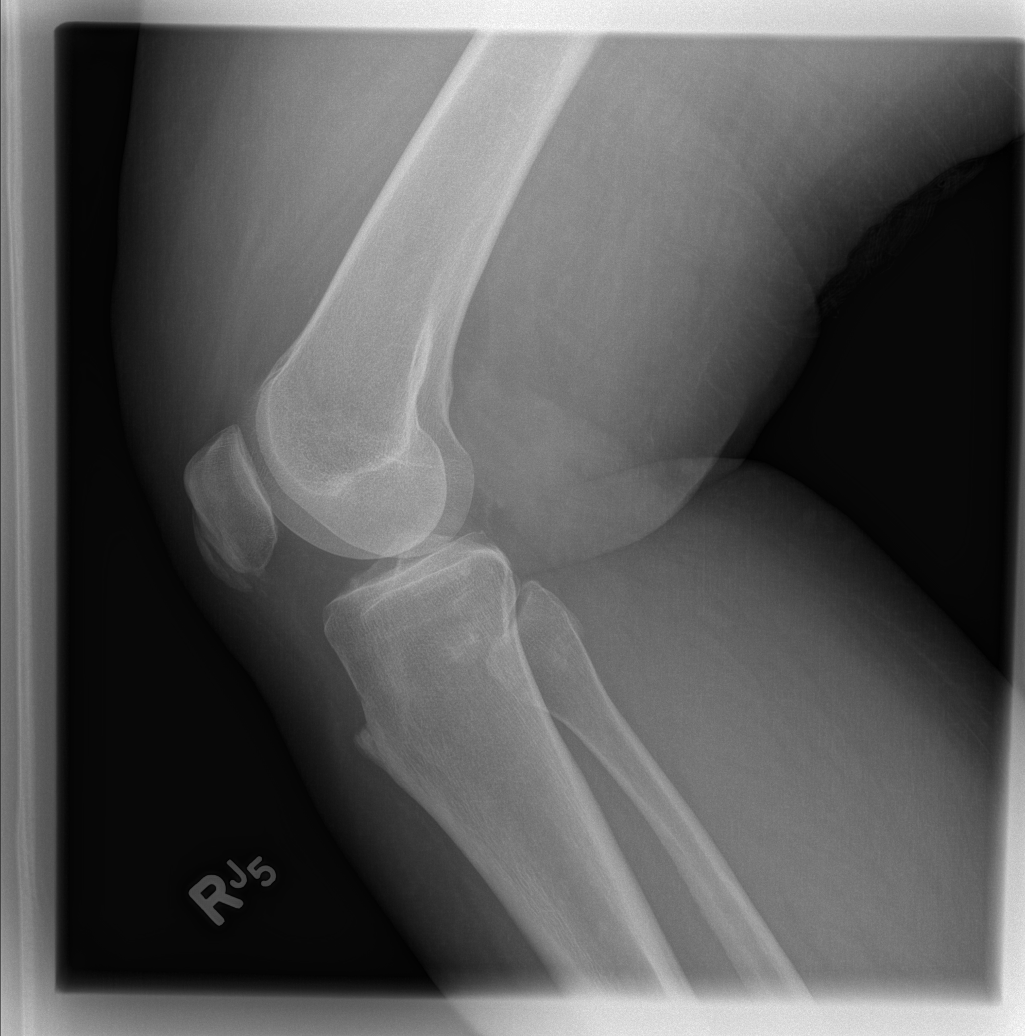

[4 of 4 positions shown; findings below may reference images not displayed]

FINDINGS: Enthesopathic change at the patellar tendon origin and
insertion.  Mild degenerative irregularity about the medial tibial
spine.  No acute fracture or dislocation.  No joint effusion.
IMPRESSION: No acute osseous abnormality.

## 2014-11-14 ENCOUNTER — Encounter: Payer: Self-pay | Admitting: Medical

## 2014-11-14 ENCOUNTER — Ambulatory Visit (INDEPENDENT_AMBULATORY_CARE_PROVIDER_SITE_OTHER): Payer: 59 | Admitting: Medical

## 2014-11-14 VITALS — BP 111/63 | HR 109 | Temp 99.0°F | Ht 63.75 in | Wt 253.8 lb

## 2014-11-14 DIAGNOSIS — J0100 Acute maxillary sinusitis, unspecified: Secondary | ICD-10-CM | POA: Insufficient documentation

## 2014-11-14 DIAGNOSIS — J0101 Acute recurrent maxillary sinusitis: Secondary | ICD-10-CM

## 2014-11-14 DIAGNOSIS — M791 Myalgia: Secondary | ICD-10-CM | POA: Diagnosis not present

## 2014-11-14 DIAGNOSIS — E119 Type 2 diabetes mellitus without complications: Secondary | ICD-10-CM

## 2014-11-14 DIAGNOSIS — J4520 Mild intermittent asthma, uncomplicated: Secondary | ICD-10-CM | POA: Diagnosis not present

## 2014-11-14 DIAGNOSIS — M609 Myositis, unspecified: Secondary | ICD-10-CM

## 2014-11-14 DIAGNOSIS — J301 Allergic rhinitis due to pollen: Secondary | ICD-10-CM | POA: Diagnosis not present

## 2014-11-14 DIAGNOSIS — IMO0001 Reserved for inherently not codable concepts without codable children: Secondary | ICD-10-CM

## 2014-11-14 DIAGNOSIS — J45909 Unspecified asthma, uncomplicated: Secondary | ICD-10-CM | POA: Insufficient documentation

## 2014-11-14 DIAGNOSIS — K589 Irritable bowel syndrome without diarrhea: Secondary | ICD-10-CM

## 2014-11-14 DIAGNOSIS — J309 Allergic rhinitis, unspecified: Secondary | ICD-10-CM | POA: Insufficient documentation

## 2014-11-14 LAB — POCT INFLUENZA A/B: INFLUENZA A, POC: NEGATIVE

## 2014-11-14 MED ORDER — AZITHROMYCIN 250 MG PO TABS: ORAL_TABLET | ORAL | Status: DC

## 2014-11-14 MED ORDER — FLUTICASONE PROPIONATE 50 MCG/ACT NA SUSP: 2.0000 | Freq: Every day | NASAL | Status: DC

## 2014-11-14 MED ORDER — BENZONATATE 100 MG PO CAPS: 100.0000 mg | ORAL_CAPSULE | Freq: Three times a day (TID) | ORAL | Status: DC | PRN

## 2014-11-14 NOTE — Patient Instructions (Addendum)
Myalgia and myositis Flu test today.  Sinusitis, acute maxillary Rx azithromycin for early sinus infection. Benzonatate for cough.  Allergic rhinitis Recommend flonase otc.    Continue current chronic meds. Please be aware if you do use albuterol frequently would recommend being on steroid inhaler such as qvar.  Follow up 7 days or as needed.  Advised 2 month check up and come in fasting for labs.

## 2014-11-14 NOTE — Assessment & Plan Note (Signed)
Rx azithromycin for early sinus infection. Benzonatate for cough.

## 2014-11-14 NOTE — Assessment & Plan Note (Signed)
Recommend flonase otc.

## 2014-11-14 NOTE — Assessment & Plan Note (Signed)
Pt states last flare was a couple of days ago. Pt is on albuterol. This came on after dusting in house. Pt uses albuterol about one time a month

## 2014-11-14 NOTE — Assessment & Plan Note (Signed)
IBS- She on bentyl. She also takes miralax one times. [facility] to [facility] and current regimen doing well.

## 2014-11-14 NOTE — Progress Notes (Signed)
Subjective:    Patient ID: Alisha Stephenson, female    DOB: Feb 13, 1966, 49 y.o.   MRN: [id number]  HPI  I have reviewed pt PMH, PSH, FH, Social History and Surgical History  Asthma- Pt states last flare was a couple of days ago. Pt is on albuterol. This came on after dusting in house. Pt uses albuterol about one time a month.  Allergic rhinitis- spring, fall and winter- on zyrtec. Recent controlled.  Diabetes- for 10 years. She is on victoza. She lost 30 pounds. Pt last a1-c 1 month ago and was 7.1. Pt is doing weight watcher. Pt is on victoza 0.6 one time a day.   IBS- She on bentyl. She also takes miralax one times. She [facility] to [facility] and current regimen doing well.  Pt was going to [place] before.   Pt customer service [employer], no exercise, 1 diet mountain dew a day, married- 3 children.  Pt states today starting to feels some chest congestion and nasal congestion. Left side sinus hurt a little. Subjective fever yesterday and some chills. Faint mild achy today.Some pnd.   Pt husband dx with sinus infection.      Review of Systems See hpi     Past Medical History  Diagnosis Date  . Diabetes mellitus without complication   . Ovarian cyst   . Asthma     History   Social History  . Marital Status: Married    Spouse Name: N/A  . Number of Children: N/A  . Years of Education: N/A   Occupational History  . Not on file.   Social History Main Topics  . Smoking status: Former Research scientist (life sciences)  . Smokeless tobacco: Never Used  . Alcohol Use: No     Comment: rarely  . Drug Use: No  . Sexual Activity: No   Other Topics Concern  . Not on file   Social History Narrative    Past Surgical History  Procedure Laterality Date  . Cesarean section    . Abdominal hysterectomy    . Cholecystectomy      Family History  Problem Relation Age of Onset  . Diabetes Father   . Heart disease Father     Allergies  Allergen Reactions  . Sulfa Antibiotics Rash     No current outpatient prescriptions on file prior to visit.   No current facility-administered medications on file prior to visit.    BP 111/63 mmHg  Pulse 109  Temp(Src) 99 F (37.2 C) (Oral)  Ht 5' 3.75" (1.619 m)  Wt 253 lb 12.8 oz (115.123 kg)  BMI 43.92 kg/m2  SpO2 99%        Objective:   Physical Exam   General  Mental Status - Alert. General Appearance - Well groomed. Not in acute distress.  Skin Rashes- No Rashes.  HEENT Head- Normal. Ear Auditory Canal - Left- Normal. Right - Normal.Tympanic Membrane- Left- Normal. Right- Normal. Eye Sclera/Conjunctiva- Left- Normal. Right- Normal. Nose & Sinuses Nasal Mucosa- Left-  Boggy and Congested. Right-  Boggy and  Congested.Bilateral  maxillary but frontal sinus pressure. Mouth & Throat Lips: Upper Lip- Normal: no dryness, cracking, pallor, cyanosis, or vesicular eruption. Lower Lip-Normal: no dryness, cracking, pallor, cyanosis or vesicular eruption. Buccal Mucosa- Bilateral- No Aphthous ulcers. Oropharynx- No Discharge or Erythema. +pnd Tonsils: Characteristics- Bilateral- No Erythema or Congestion. Size/Enlargement- Bilateral- No enlargement. Discharge- bilateral-None.  Neck Neck- Supple. No Masses.   Chest and Lung Exam Auscultation: Breath Sounds:-Clear even and  unlabored.  Cardiovascular Auscultation:Rythm- Regular, rate and rhythm. Murmurs & Other Heart Sounds:Ausculatation of the heart reveal- No Murmurs.  Lymphatic Head & Neck General Head & Neck Lymphatics: Bilateral: Description- No Localized lymphadenopathy.   Abdomen Inspection:-Inspection Normal.  Palpation/Perucssion: Palpation and Percussion of the abdomen reveal- Non Tender, No Rebound tenderness, No rigidity(Guarding) and No Palpable abdominal masses.  Liver:-Normal.  Spleen:- Normal.         Assessment & Plan:

## 2014-11-14 NOTE — Progress Notes (Signed)
Pre visit review using our clinic review tool, if applicable. No additional management support is needed unless otherwise documented below in the visit note. 

## 2014-11-14 NOTE — Assessment & Plan Note (Addendum)
Flu test today. Was neg.

## 2014-11-14 NOTE — Assessment & Plan Note (Signed)
for 10 years. She is on victoza. She lost 30 pounds. Pt last a1-c 1 month ago and was 7.1. Pt is doing weight watcher. Pt is on victoza 0.6 one time a day.

## 2014-12-11 ENCOUNTER — Encounter: Payer: Self-pay | Admitting: Physician Assistant

## 2014-12-11 ENCOUNTER — Ambulatory Visit (INDEPENDENT_AMBULATORY_CARE_PROVIDER_SITE_OTHER): Payer: 59 | Admitting: Physician Assistant

## 2014-12-11 VITALS — BP 127/71 | HR 63 | Temp 98.2°F | Ht 63.75 in | Wt 254.2 lb

## 2014-12-11 DIAGNOSIS — W19XXXA Unspecified fall, initial encounter: Secondary | ICD-10-CM | POA: Diagnosis not present

## 2014-12-11 DIAGNOSIS — M25561 Pain in right knee: Secondary | ICD-10-CM | POA: Diagnosis not present

## 2014-12-11 MED ORDER — ALBUTEROL SULFATE HFA 108 (90 BASE) MCG/ACT IN AERS: 2.0000 | INHALATION_SPRAY | Freq: Four times a day (QID) | RESPIRATORY_TRACT | Status: DC | PRN

## 2014-12-11 NOTE — Progress Notes (Signed)
Pre visit review using our clinic review tool, if applicable. No additional management support is needed unless otherwise documented below in the visit note. 

## 2014-12-11 NOTE — Patient Instructions (Signed)
Please take and Aleve twice daily as directed over the next week.  Keep knee iced and elevated. Apply Knee brace and wear throughout day over the next week. Apply an ACE wrap around the ankle to wear throughout the day. Wear lace up shoes when active to add stability to ankle.  Follow-up if symptoms are not resolving.

## 2014-12-11 NOTE — Progress Notes (Signed)
    Patient presents to clinic today c/o pain in R knee and ankle after a [place] the beach on [place].  Patient states she slipped out of her flip flop and fell directly onto knee. Denies twisting injury. Was able to get up and ambulate after fall. Pain and soreness developed over next 24 hours.  Endorses pain of anterior knee and lateral ankle with mild swelling. Denies numbness, tingling or weakness.  Past Medical History  Diagnosis Date  . Diabetes mellitus without complication   . Ovarian cyst   . Asthma     Current Outpatient Prescriptions on File Prior to Visit  Medication Sig Dispense Refill  . fluticasone (FLONASE) 50 MCG/ACT nasal spray Place 2 sprays into both nostrils daily. 16 g 1  . Liraglutide (VICTOZA [place]) Inject 0.6 Units into the skin.     No current facility-administered medications on file prior to visit.    Allergies  Allergen Reactions  . Sulfa Antibiotics Rash    Family History  Problem Relation Age of Onset  . Diabetes Father   . Heart disease Father     History   Social History  . Marital Status: Married    Spouse Name: N/A  . Number of Children: N/A  . Years of Education: N/A   Social History Main Topics  . Smoking status: Former Research scientist (life sciences)  . Smokeless tobacco: Never Used  . Alcohol Use: No     Comment: rarely  . Drug Use: No  . Sexual Activity: No   Other Topics Concern  . None   Social History Narrative   Review of Systems - See HPI.  All other ROS are negative.  BP 127/71 mmHg  Pulse 63  Temp(Src) 98.2 F (36.8 C) (Oral)  Ht 5' 3.75" (1.619 m)  Wt 254 lb 3.2 oz (115.304 kg)  BMI 43.99 kg/m2  SpO2 100%  Physical Exam  Constitutional: She is oriented to person, place, and time and well-developed, well-nourished, and in no distress.  HENT:  Head: Normocephalic and atraumatic.  Eyes: Conjunctivae are normal.  Cardiovascular: Normal rate, regular rhythm, normal heart sounds and intact distal pulses.   Pulmonary/Chest: Effort  normal and breath sounds normal. No respiratory distress. She has no wheezes. She has no rales. She exhibits no tenderness.  Musculoskeletal:       Right knee: She exhibits normal range of motion, no swelling, normal alignment, no LCL laxity, normal patellar mobility, no bony tenderness, normal meniscus and no MCL laxity. Tenderness found. Lateral joint line tenderness noted.       Right ankle: She exhibits normal range of motion, no swelling and normal pulse. Tenderness. AITFL tenderness found. Achilles tendon normal.  Neurological: She is alert and oriented to person, place, and time.  Skin: Skin is warm and dry. No rash noted.  Psychiatric: Affect normal.  Vitals reviewed.   Recent Results (from the past 2160 hour(s))  POCT Influenza A/B     Status: Normal   Collection Time: 11/14/14 11:32 AM  Result Value Ref Range   Influenza A, POC Negative    Influenza B, POC      Assessment/Plan: Fall Patient ambulating without difficulty. Fall with contusion to R knee and mild sprain to R ankle. Doubt fracture. Bracing instructions given for knee and ankle. RICE therapy. Aleve BID. Exercises discussed. Follow-up if symptoms are not resolving.

## 2014-12-11 NOTE — Assessment & Plan Note (Signed)
Patient ambulating without difficulty. Fall with contusion to R knee and mild sprain to R ankle. Doubt fracture. Bracing instructions given for knee and ankle. RICE therapy. Aleve BID. Exercises discussed. Follow-up if symptoms are not resolving.

## 2015-01-21 ENCOUNTER — Ambulatory Visit: Payer: 59 | Admitting: Medical

## 2015-01-31 ENCOUNTER — Ambulatory Visit (INDEPENDENT_AMBULATORY_CARE_PROVIDER_SITE_OTHER): Payer: 59 | Admitting: Medical

## 2015-01-31 ENCOUNTER — Encounter: Payer: Self-pay | Admitting: Medical

## 2015-01-31 ENCOUNTER — Ambulatory Visit: Payer: 59 | Admitting: Medical

## 2015-01-31 VITALS — BP 136/72 | HR 83 | Temp 98.9°F | Ht 63.75 in | Wt 246.2 lb

## 2015-01-31 DIAGNOSIS — R002 Palpitations: Secondary | ICD-10-CM

## 2015-01-31 DIAGNOSIS — R9431 Abnormal electrocardiogram [ECG] [EKG]: Secondary | ICD-10-CM | POA: Diagnosis not present

## 2015-01-31 DIAGNOSIS — E119 Type 2 diabetes mellitus without complications: Secondary | ICD-10-CM | POA: Diagnosis not present

## 2015-01-31 LAB — COMPREHENSIVE METABOLIC PANEL
ALT: 30 U/L (ref 0–35)
AST: 23 U/L (ref 0–37)
Albumin: 4 g/dL (ref 3.5–5.2)
Alkaline Phosphatase: 96 U/L (ref 39–117)
BUN: 11 mg/dL (ref 6–23)
CALCIUM: 9.4 mg/dL (ref 8.4–10.5)
CHLORIDE: 104 meq/L (ref 96–112)
CO2: 31 mEq/L (ref 19–32)
CREATININE: 0.72 mg/dL (ref 0.40–1.20)
GFR: 91.07 mL/min (ref >60.00)
Glucose, Bld: 170 mg/dL — ABNORMAL HIGH (ref 70–99)
POTASSIUM: 4.3 meq/L (ref 3.5–5.1)
SODIUM: 140 meq/L (ref 135–145)
Total Bilirubin: 0.6 mg/dL (ref 0.2–1.2)
Total Protein: 7 g/dL (ref 6.0–8.3)

## 2015-01-31 LAB — TROPONIN I: TNIDX: 0 ug/L (ref 0.00–0.06)

## 2015-01-31 LAB — HEMOGLOBIN A1C: HEMOGLOBIN A1C: 6.5 % (ref 4.6–6.5)

## 2015-01-31 NOTE — Progress Notes (Signed)
Pre visit review using our clinic review tool, if applicable. No additional management support is needed unless otherwise documented below in the visit note. 

## 2015-01-31 NOTE — Patient Instructions (Addendum)
For your hx of diabetes. We will check cmp today and get a1-c. Continue your diet and mild(walking only) exercise(But caution if any chest pain or palpitations during exercise the stop and be evaluated.)  With your history of palpitations, I would proceed with caution as stated above. Since you had a work up 1 year ago I would ask that you sign release form so we can review that work up in order to help [place] determine if further work up may be needed. I think getting a pulse/ox monitor would be a good idea. In event you get palpitation you could check pulse/02 and document the result. Let us know if these palpitation events are re-occuring. If so then would refer for holter monitor. Also reduce/elimate caffeine in your diet. Any severe cardiac symptoms then ED evaluation.   Note on ekg some leads look to have negative t wave. This is likely reason prior MD sent her to ED years ago. Unfortunately no ekg to compare to. Pt is asymptomatic. Will get troponin for caution sake. Advise pt in event any cardiac symptoms then ED eval.  Follow up in 2 weeks or as needed

## 2015-01-31 NOTE — Progress Notes (Signed)
Subjective:    Patient ID: Alisha Stephenson, female    DOB: 1966-02-04, 49 y.o.   MRN: [id number]  HPI  Pt in follow up on her blood sugar. In May she had a1-c at her prior MD office. At that time a1-c was 7.1. Pt is exercising 3-4 times a week. She lost total 45 lbs over a year purposeful weight loss. About 20 lb loss over 3 months since starting weight watchers.   Pt states after weight loss and since June she has been having some random blood sugars about 60.  Then pt starts to review me on her heart history. Pt has been seen by [facility](Dr. Lestine Mount did echo just last year and told her heart fine and ok to exercise). They told her her heart was fine. But remote hx years ago of ekg that caused a provider some concern even though she was completley asymptomatic. She had evaluation of ekg after seeing dentist and having some teeth pain.   Pt brings the above up since she had random palpitation just sitting. She states 2 events lasted 30 seconds. Then resolved. No associated cardiac signs or symptoms described along with this. She had last event happened  about 4-5 days.     Review of Systems  Constitutional: Negative for fever, chills and fatigue.  Respiratory: Negative for cough, choking, chest tightness, shortness of breath and wheezing.   Cardiovascular: Negative for chest pain and palpitations.       No palpitations now but some recently past 2 weeks.  Gastrointestinal: Negative for abdominal pain.  Endocrine: Negative for polydipsia, polyphagia and polyuria.  Musculoskeletal: Negative for back pain.  Hematological: Negative for adenopathy. Does not bruise/bleed easily.  Psychiatric/Behavioral: Negative for behavioral problems and confusion.     Past Medical History  Diagnosis Date  . Diabetes mellitus without complication   . Ovarian cyst   . Asthma     Social History   Social History  . Marital Status: Married    Spouse Name: N/A  . Number of Children: N/A    . Years of Education: N/A   Occupational History  . Not on file.   Social History Main Topics  . Smoking status: Former Research scientist (life sciences)  . Smokeless tobacco: Never Used  . Alcohol Use: No     Comment: rarely  . Drug Use: No  . Sexual Activity: No   Other Topics Concern  . Not on file   Social History Narrative    Past Surgical History  Procedure Laterality Date  . Cesarean section    . Abdominal hysterectomy    . Cholecystectomy      Family History  Problem Relation Age of Onset  . Diabetes Father   . Heart disease Father     Allergies  Allergen Reactions  . Sulfa Antibiotics Rash    Current Outpatient Prescriptions on File Prior to Visit  Medication Sig Dispense Refill  . albuterol (PROVENTIL HFA;VENTOLIN HFA) 108 (90 BASE) MCG/ACT inhaler Inhale 2 puffs into the lungs every 6 (six) hours as needed for wheezing or shortness of breath. 1 Inhaler 0  . fluticasone (FLONASE) 50 MCG/ACT nasal spray Place 2 sprays into both nostrils daily. (Patient not taking: Reported on 01/31/2015) 16 g 1  . Liraglutide (VICTOZA [place]) Inject 0.6 Units into the skin.     No current facility-administered medications on file prior to visit.    BP 136/72 mmHg  Pulse 83  Temp(Src) 98.9 F (37.2 C) (Oral)  Ht 5'  3.75" (1.619 m)  Wt 246 lb 3.2 oz (111.676 kg)  BMI 42.61 kg/m2  SpO2 99%       Objective:   Physical Exam  General Mental Status- Alert. General Appearance- Not in acute distress.   Skin General: Color- Normal Color. Moisture- Normal Moisture.  Neck Carotid Arteries- Normal color. Moisture- Normal Moisture. No carotid bruits. No JVD.  Chest and Lung Exam Auscultation: Breath Sounds:-Normal, CTA  Cardiovascular Auscultation:Rythm- Regular, Rate and Rhythm. Murmurs & Other Heart Sounds:Auscultation of the heart reveals- No Murmurs.  Abdomen Inspection:-Inspeection Normal. Palpation/Percussion:Note:No mass. Palpation and Percussion of the abdomen reveal- Non Tender,  Non Distended + BS, no rebound or guarding.    Neurologic Cranial Nerve exam:- CN III-XII intact(No nystagmus), symmetric smile. Strength:- 5/5 equal and symmetric strength both upper and lower extremities.  Anterior chest- no chest wall tenderness to palpation.        Assessment & Plan:  For your hx of diabetes. We will check cmp today and get a1-c. Continue your diet and mild  Exercise--walking only(But caution if any chest pain or palpitations during exercise the stop and be evaluated.)  With your history of palpitations, I would proceed with caution as stated above. Since you had a work up 1 year ago I would ask that you sign release form so we can review that work up in order to help [place] determine if further work up may be needed. I think getting a pulse/ox monitor would be a good idea. In event you get palpitation you could check pulse/02 and document the result. Let us know if these palpitation events are re-occuring. If so then would refer for holter monitor. Also reduce/elimate caffeine in your diet. Any severe cardiac symptoms then ED evaluation.  Note on ekg some leads look to have negative t wave. This is likely reason prior MD sent her to ED years ago. Unfortunately no ekg to compare to. Pt is asymptomatic. Will get troponin for caution sake. Advise pt in event any cardiac symptoms then ED eval.  Also asking pt to sign release form as she is leaving so I can get her cardiologist notes and the prior/old ekg.  Follow up in 2 weeks or as needed

## 2015-01-31 NOTE — Addendum Note (Signed)
Addended by: Tasia Catchings on: 01/31/2015 02:37 PM   Modules accepted: Orders

## 2015-02-04 ENCOUNTER — Telehealth: Payer: Self-pay | Admitting: Medical

## 2015-02-04 NOTE — Telephone Encounter (Signed)
I asked for her cardiology records and got some. But I never got an ekg. Would you call Dr. Nehemiah Massed office and see if they have one over that they could fax. Thanks.

## 2015-02-14 ENCOUNTER — Ambulatory Visit (INDEPENDENT_AMBULATORY_CARE_PROVIDER_SITE_OTHER): Payer: 59 | Admitting: Medical

## 2015-02-14 ENCOUNTER — Encounter: Payer: Self-pay | Admitting: Medical

## 2015-02-14 VITALS — BP 126/80 | HR 89 | Temp 98.2°F | Resp 16 | Ht 63.75 in | Wt 248.0 lb

## 2015-02-14 DIAGNOSIS — R002 Palpitations: Secondary | ICD-10-CM | POA: Diagnosis not present

## 2015-02-14 NOTE — Progress Notes (Signed)
Pre visit review using our clinic review tool, if applicable. No additional management support is needed unless otherwise documented below in the visit note. 

## 2015-02-14 NOTE — Progress Notes (Signed)
Subjective:    Patient ID: Alisha Stephenson, female    DOB: Jul 06, 1965, 49 y.o.   MRN: [id number]  HPI   Pt in states she feels overall fine but does states will occasional still have  palpitation sensation. Feel like brief sensation of heart racing. Would last for about 10 seconds. She tried to get her pulse ox machine to see if could catch fast hr but by time she got it on finger sensation resolved. This last happened 4 days ago. Happened 2 other times since last visit.Prior cardiologist did echo. But pt never had holter monitor done.Pt does not drink caffeinated beverages.  Today feels fine.     Review of Systems  Constitutional: Negative for fever, chills and fatigue.  Respiratory: Negative for cough, chest tightness, shortness of breath and wheezing.   Cardiovascular: Positive for palpitations. Negative for chest pain.  Gastrointestinal: Negative for abdominal pain, diarrhea and constipation.  Musculoskeletal: Negative for back pain.  Hematological: Negative for adenopathy. Does not bruise/bleed easily.  Psychiatric/Behavioral: Negative for suicidal ideas, hallucinations, behavioral problems, sleep disturbance, decreased concentration and agitation. The patient is not nervous/anxious.     Past Medical History  Diagnosis Date  . Diabetes mellitus without complication   . Ovarian cyst   . Asthma     Social History   Social History  . Marital Status: Married    Spouse Name: N/A  . Number of Children: N/A  . Years of Education: N/A   Occupational History  . Not on file.   Social History Main Topics  . Smoking status: Former Research scientist (life sciences)  . Smokeless tobacco: Never Used  . Alcohol Use: No     Comment: rarely  . Drug Use: No  . Sexual Activity: No   Other Topics Concern  . Not on file   Social History Narrative    Past Surgical History  Procedure Laterality Date  . Cesarean section    . Abdominal hysterectomy    . Cholecystectomy      Family History  Problem  Relation Age of Onset  . Diabetes Father   . Heart disease Father     Allergies  Allergen Reactions  . Sulfa Antibiotics Rash    Current Outpatient Prescriptions on File Prior to Visit  Medication Sig Dispense Refill  . albuterol (PROVENTIL HFA;VENTOLIN HFA) 108 (90 BASE) MCG/ACT inhaler Inhale 2 puffs into the lungs every 6 (six) hours as needed for wheezing or shortness of breath. 1 Inhaler 0  . fluticasone (FLONASE) 50 MCG/ACT nasal spray Place 2 sprays into both nostrils daily. 16 g 1   No current facility-administered medications on file prior to visit.    BP 126/80 mmHg  Pulse 89  Temp(Src) 98.2 F (36.8 C) (Oral)  Resp 16  Ht 5' 3.75" (1.619 m)  Wt 248 lb (112.492 kg)  BMI 42.92 kg/m2  SpO2 98%       Objective:   Physical Exam  General Mental Status- Alert. General Appearance- Not in acute distress.   Skin General: Color- Normal Color. Moisture- Normal Moisture.  Neck Carotid Arteries- Normal color. Moisture- Normal Moisture. No carotid bruits. No JVD.  Chest and Lung Exam Auscultation: Breath Sounds:-Normal. CTA.  Cardiovascular Auscultation:Rythm- Regular, Rate and Rhythm. Murmurs & Other Heart Sounds:Auscultation of the heart reveals- No Murmurs.  Abdomen Inspection:-Inspeection Normal. Palpation/Percussion:Note:No mass. Palpation and Percussion of the abdomen reveal- Non Tender, Non Distended + BS, no rebound or guarding.    Neurologic Cranial Nerve exam:- CN III-XII intact(No nystagmus), symmetric  smile. Strength:- 5/5 equal and symmetric strength both upper and lower extremities.       Assessment & Plan:  With your persisting palpitation sensation would now refer you to cardiology for evaluation and likely holter monitor.   Still avoid caffeine beverages. If you get palpitation again we could still evaluate you and try to capture potential abnormal rhythm. If after hours then ED evaluation.  If any chest pain then be evaluated asap  same day here or ED.  No exercise until sees cardiologist.  Follow up with [place] as needed prior to cardiologist referral.  No symptoms today. On ausculation rrr. So no ekg done today.

## 2015-02-14 NOTE — Patient Instructions (Signed)
With your persisting palpitation sensation would now refer you to cardiology for evaluation and likely holter monitor.   Still avoid caffeine beverages. If you get palpitation again we could still evaluate you and try to capture potential abnormal rhythm. If after hours then ED evaluation.  If any chest pain then be evaluated asap same day here or ED.  No exercise until sees cardiologist.  Follow up with [place] as needed prior to cardiologist referral.

## 2015-03-06 ENCOUNTER — Other Ambulatory Visit (HOSPITAL_BASED_OUTPATIENT_CLINIC_OR_DEPARTMENT_OTHER): Payer: Self-pay | Admitting: Family Medicine

## 2015-03-06 DIAGNOSIS — Z1231 Encounter for screening mammogram for malignant neoplasm of breast: Secondary | ICD-10-CM

## 2015-03-07 ENCOUNTER — Ambulatory Visit (INDEPENDENT_AMBULATORY_CARE_PROVIDER_SITE_OTHER): Payer: 59 | Admitting: Cardiology

## 2015-03-07 ENCOUNTER — Encounter: Payer: Self-pay | Admitting: Cardiology

## 2015-03-07 VITALS — BP 130/70 | HR 78 | Ht 63.0 in | Wt 246.8 lb

## 2015-03-07 DIAGNOSIS — R002 Palpitations: Secondary | ICD-10-CM

## 2015-03-07 DIAGNOSIS — R0789 Other chest pain: Secondary | ICD-10-CM | POA: Diagnosis not present

## 2015-03-07 DIAGNOSIS — E119 Type 2 diabetes mellitus without complications: Secondary | ICD-10-CM | POA: Diagnosis not present

## 2015-03-07 DIAGNOSIS — R9431 Abnormal electrocardiogram [ECG] [EKG]: Secondary | ICD-10-CM | POA: Diagnosis not present

## 2015-03-07 NOTE — Progress Notes (Signed)
Cardiology Office Note   Date:  03/07/2015   ID:  Alisha, Stephenson 1966/04/25, MRN [id number]  PCP:  Mackie Pai, PA-C  Cardiologist:   Candee Furbish, MD       History of Present Illness: Alisha Stephenson is a 49 y.o. female here for evaluation of palpitations. Several years ago she had an EKG that caused a provider concern although she was asymptomatic.  Overall, she will have occasional palpitations especially in the evening hours when sitting. At certain point she will feel her heart race fast for a few seconds duration then terminate abruptly. Perhaps paroxysmal atrial tachycardia. She has worn event monitors in the past, PVC/PACs, benign.  Occasionally she will have atypical chest wall discomfort, fleeting. No associated syncope or other high risk symptoms. Occasionally she will also have the sensation of being able to get any deep breath.  She has diabetes, coronary artery disease equivalent with hemoglobin A1c of 7.1 down to 6.5. She has had a significant weight loss of 45 pounds.  CT of abdomen personally viewed that showed no aortic atherosclerosis. Her husband, [name] accompanied her at this visit. Her father was a patient here, he had died with lung disease/heart disease. She became quite emotional when thinking of her father as this is close to the anniversary of his death.    Past Medical History  Diagnosis Date  . Diabetes mellitus without complication   . Ovarian cyst   . Asthma     Past Surgical History  Procedure Laterality Date  . Cesarean section    . Abdominal hysterectomy    . Cholecystectomy       Current Outpatient Prescriptions  Medication Sig Dispense Refill  . albuterol (PROVENTIL HFA;VENTOLIN HFA) 108 (90 BASE) MCG/ACT inhaler Inhale 2 puffs into the lungs every 6 (six) hours as needed for wheezing or shortness of breath. 1 Inhaler 0  . cetirizine (ZYRTEC) 10 MG tablet Take 10 mg by mouth daily.    . fluticasone (FLONASE) 50 MCG/ACT  nasal spray Place 2 sprays into both nostrils daily. 16 g 1   No current facility-administered medications for this visit.    Allergies:   Sulfa antibiotics    Social History:  The patient  reports that she has quit smoking. She has never used smokeless tobacco. She reports that she does not drink alcohol or use illicit drugs.   Family History:  The patient's family history includes Diabetes in her father; Heart disease in her father. Dad died of lung dz.    ROS:  Please see the history of present illness.   Otherwise, review of systems are positive for shortness of breath with activity, abdominal pain, snoring, anxiety, irregular heartbeats.   All other systems are reviewed and negative.    PHYSICAL EXAM: VS:  BP 130/70 mmHg  Pulse 78  Ht 5\' 3"  (1.6 m)  Wt 246 lb 12.8 oz (111.948 kg)  BMI 43.73 kg/m2 , BMI Body mass index is 43.73 kg/(m^2). GEN: Well nourished, well developed, in no acute distress HEENT: normal Neck: no JVD, carotid bruits, or masses Cardiac: RRR; no murmurs, rubs, or gallops,no edema  Respiratory:  clear to auscultation bilaterally, normal work of breathing GI: soft, nontender, nondistended, + BS, overweight MS: no deformity or atrophy Skin: warm and dry, no rash Neuro:  Strength and sensation are intact Psych: euthymic mood, full affect   EKG:  Today 03/07/15-sinus rhythm, 78, rightward axis, nonspecific ST-T wave changes with mild T-wave inversion noted in  V2, V3 and flattening in lateral precordial leads. No signature can change from prior personally viewed-01/31/15-sinus rhythm, T-wave inversion noted in anterior precordial leads consider ischemia.  Recent Labs: 01/31/2015: ALT 30; BUN 11; Creatinine, Ser 0.72; Potassium 4.3; Sodium 140    Lipid Panel No results found for: CHOL, TRIG, HDL, CHOLHDL, VLDL, LDLCALC, LDLDIRECT    Wt Readings from Last 3 Encounters:  03/07/15 246 lb 12.8 oz (111.948 kg)  02/14/15 248 lb (112.492 kg)  01/31/15 246 lb 3.2  oz (111.676 kg)      Other studies Reviewed: Additional studies/ records that were reviewed today include: Labs, EKG, office notes. Review of the above records demonstrates: As above   ASSESSMENT AND PLAN:  1.  Palpitations-prior workup included echo which reportedly was normal. Over 15 years ago she was pressed to the hospital because of her EKG abnormalities and stayed there for 3 days. Workup was unremarkable. Currently, I would like to perform a stress echo to exclude any ischemia and make sure that it is safe for her to exercise. There is no evidence of aortic atherosclerosis on CT scan. She is avoiding decongestions. She is avoiding heavy caffeine use. It is likely that what she is describing are PVCs or PACs as was previously detected, could even be paroxysmal atrial tachycardia with short bursts of fast her heart rate. She has been monitored in the past. TSH has been normal in the past. No electrolyte abnormalities. If her symptoms worsen or become more worrisome, we may have low threshold to repeat event monitor.  2. Atypical chest pain-rare, atypical chest pain. In the past she was described as having jaw pain, went to dentist, went to medical doctor who then admitted her to the hospital as above. We will check a stress echo. She is somewhat fearful of needles/IV so we will avoid nuclear stress testing if possible.  3. Abnormal EKG-has been told in the past that she has had a juvenile T-wave pattern. Certainly this could be the case EKG abnormality has been present since birth/she was young.  4. Morbid obesity-excellent job with weight loss. Over 45 pounds. She is eager to once again start back at the gym. I am comfortable with her walking and light gym workout until stress echo is performed.  5. Diabetes-with weight loss of 45 pounds, her hemoglobin A1c has come down. Excellent. She is no longer on Victoza.     Current medicines are reviewed at length with the patient today.  The  patient does not have concerns regarding medicines.  The following changes have been made:  no change  Labs/ tests ordered today include:   Orders Placed This Encounter  Procedures  . EKG 12-Lead  . Echo stress     Disposition:  I will follow-up with results of testing.  Bobby Rumpf, MD  03/07/2015 8:40 AM    [facility] [address], [place], [place]  [address] Phone: [phone]; Fax: [phone]

## 2015-03-07 NOTE — Patient Instructions (Addendum)
Medication Instructions:  The current medical regimen is effective;  continue present plan and medications.  Testing/Procedures: Your physician has requested that you have a stress echocardiogram. For further information please visit [email or web address]. Please follow instruction sheet as given.  Follow-Up: Follow up as needed after testing.  Thank you for choosing [facility]!!

## 2015-03-11 ENCOUNTER — Other Ambulatory Visit: Payer: Self-pay | Admitting: Medical

## 2015-03-11 DIAGNOSIS — Z1231 Encounter for screening mammogram for malignant neoplasm of breast: Secondary | ICD-10-CM

## 2015-03-12 ENCOUNTER — Ambulatory Visit (HOSPITAL_BASED_OUTPATIENT_CLINIC_OR_DEPARTMENT_OTHER)
Admission: RE | Admit: 2015-03-12 | Discharge: 2015-03-12 | Disposition: A | Discharge status: Home / Self Care | Payer: 59 | Source: Ambulatory Visit | Attending: Family Medicine | Admitting: Family Medicine

## 2015-03-12 DIAGNOSIS — Z1231 Encounter for screening mammogram for malignant neoplasm of breast: Secondary | ICD-10-CM | POA: Diagnosis not present

## 2015-03-18 ENCOUNTER — Other Ambulatory Visit: Payer: Self-pay | Admitting: Medical

## 2015-03-18 ENCOUNTER — Encounter: Payer: Self-pay | Admitting: Medical

## 2015-03-18 ENCOUNTER — Ambulatory Visit (HOSPITAL_BASED_OUTPATIENT_CLINIC_OR_DEPARTMENT_OTHER)
Admission: RE | Admit: 2015-03-18 | Discharge: 2015-03-18 | Disposition: A | Discharge status: Home / Self Care | Payer: 59 | Source: Ambulatory Visit | Attending: Medical | Admitting: Medical

## 2015-03-18 ENCOUNTER — Ambulatory Visit (INDEPENDENT_AMBULATORY_CARE_PROVIDER_SITE_OTHER): Payer: 59 | Admitting: Medical

## 2015-03-18 VITALS — BP 126/70 | HR 71 | Temp 98.1°F | Ht 63.0 in | Wt 250.2 lb

## 2015-03-18 DIAGNOSIS — M25562 Pain in left knee: Secondary | ICD-10-CM

## 2015-03-18 DIAGNOSIS — M79645 Pain in left finger(s): Secondary | ICD-10-CM

## 2015-03-18 DIAGNOSIS — M25551 Pain in right hip: Secondary | ICD-10-CM | POA: Diagnosis not present

## 2015-03-18 DIAGNOSIS — M79642 Pain in left hand: Secondary | ICD-10-CM | POA: Insufficient documentation

## 2015-03-18 MED ORDER — TRAMADOL HCL 50 MG PO TABS: 50.0000 mg | ORAL_TABLET | Freq: Three times a day (TID) | ORAL | Status: DC | PRN

## 2015-03-18 NOTE — Progress Notes (Signed)
Pre visit review using our clinic review tool, if applicable. No additional management support is needed unless otherwise documented below in the visit note. 

## 2015-03-18 NOTE — Patient Instructions (Signed)
For your lt knee, lt 3rd digit pain, and rt hip pain will get xrays.  For pain take ibuprofen otc. Will tx tramadol for more severe pain if needed.  If xray negative on hand then finger splint otc and buddy tape digit  for a week.  Follow other xrays. If fx to knee or hip(unlikley) then refer to orthopedist.  Follow up in 10 days or as needed.  I will fill out your injury when xrays back.

## 2015-03-18 NOTE — Progress Notes (Signed)
Subjective:    Patient ID: Alisha Stephenson, female    DOB: 12/21/1965, 49 y.o.   MRN: [id number]  HPI  Pt in states she fell yesterday. She was walking at [employer]. Was wearing flip flops and it was muddy. She land on both knees but left knee hurts the worse. Later in the day after her fall her  rt  hamstring was hurting.   Pt mentioned same day left middle finger was hurting.   Slight diffuse achiness since fall.  Pt also mentioned other fall episodes. Example. Slipping on floor in [employer]. Water was on floor. Other time at beach. Water on floor in bathroom of condo. Another time just rained. And slipped on wet deck.   Review of Systems  Constitutional: Negative for fever, chills, diaphoresis, activity change and fatigue.  Respiratory: Negative for cough, chest tightness and shortness of breath.   Cardiovascular: Negative for chest pain, palpitations and leg swelling.  Gastrointestinal: Negative for nausea, vomiting and abdominal pain.  Musculoskeletal: Negative for neck pain and neck stiffness.       Rt knee faint pain.  Lt knee- moderate to severe pain. Rt hamstring area pain. Rt hand/mid finger pain.  Neurological: Negative for dizziness, tremors, seizures, syncope, facial asymmetry, speech difficulty, weakness, light-headedness, numbness and headaches.  Psychiatric/Behavioral: Negative for behavioral problems, confusion and agitation. The patient is not nervous/anxious.     Past Medical History  Diagnosis Date  . Diabetes mellitus without complication ([place])   . Ovarian cyst   . Asthma     Social History   Social History  . Marital Status: Married    Spouse Name: N/A  . Number of Children: N/A  . Years of Education: N/A   Occupational History  . Not on file.   Social History Main Topics  . Smoking status: Former Research scientist (life sciences)  . Smokeless tobacco: Never Used  . Alcohol Use: No     Comment: rarely  . Drug Use: No  . Sexual Activity: No   Other  Topics Concern  . Not on file   Social History Narrative    Past Surgical History  Procedure Laterality Date  . Cesarean section    . Abdominal hysterectomy    . Cholecystectomy      Family History  Problem Relation Age of Onset  . Diabetes Father   . Heart disease Father     Allergies  Allergen Reactions  . Sulfa Antibiotics Rash    Current Outpatient Prescriptions on File Prior to Visit  Medication Sig Dispense Refill  . albuterol (PROVENTIL HFA;VENTOLIN HFA) 108 (90 BASE) MCG/ACT inhaler Inhale 2 puffs into the lungs every 6 (six) hours as needed for wheezing or shortness of breath. 1 Inhaler 0  . cetirizine (ZYRTEC) 10 MG tablet Take 10 mg by mouth daily.    . fluticasone (FLONASE) 50 MCG/ACT nasal spray Place 2 sprays into both nostrils daily. 16 g 1   No current facility-administered medications on file prior to visit.    BP 126/70 mmHg  Pulse 71  Temp(Src) 98.1 F (36.7 C) (Oral)  Ht 5\' 3"  (1.6 m)  Wt 250 lb 3.2 oz (113.49 kg)  BMI 44.33 kg/m2  SpO2 98%       Objective:   Physical Exam  General Mental Status- Alert. General Appearance- Not in acute distress.   Skin General: Color- Normal Color. Moisture- Normal Moisture.  Neck Carotid Arteries- Normal color. Moisture- Normal Moisture. No carotid bruits. No JVD.  Chest and Lung Exam Auscultation: Breath Sounds:-Normal. CTA.  Cardiovascular Auscultation:Rythm- Regular,Rate, and Rhythm. Murmurs & Other Heart Sounds:Auscultation of the heart reveals- No Murmurs.  Abdomen Inspection:-Inspeection Normal. Palpation/Percussion:Note:No mass. Palpation and Percussion of the abdomen reveal- Non Tender, Non Distended + BS, no rebound or guarding.    Neurologic Cranial Nerve exam:- CN III-XII intact(No nystagmus), symmetric smile. Strength:- 5/5 equal and symmetric strength both upper and lower extremities. Gait does appear normal.   Lt knee- full range of motion, no crepitus,  Mild tibial  plateau tenderness. Moderate to severe pain directly over the patella.  Rt knee- no pain on palpation today. Very faint pain on flexion and extension.  Rt hip- mild pain on range of motion.  Rt lower ext- on stretching pain to hamstring. But no pain on palpation of rt thigh.  Rt hand- no swelling or bruise. Rt mid finger tender to palpation dorsal aspect pip to dip.  Lt knee, rt hip, rt hamstring, lt mid finger.      Assessment & Plan:  For your lt knee, lt 3rd digit pain, and rt hip pain will get xrays.  For pain take ibuprofen otc. Will tx tramadol for more severe pain if needed.  If xray negative on hand then finger splint otc and buddy tape digit  for a week.  Follow other xrays. If fx to knee or hip(unlikley) then refer to orthopedist.  Follow up in 10 days or as needed.  I will fill out your injury when xrays back.   Explained to pt to be careful to prevent falls. All of falls occurred walking on wet floor or surgace. She is relatively young. If further falls may refer to PT.

## 2015-03-22 ENCOUNTER — Telehealth: Payer: Self-pay | Admitting: Medical

## 2015-03-22 NOTE — Telephone Encounter (Signed)
I filled out pt accidental injury claim form on 03-22-2015. Gave it to staff member to mail/fax.

## 2015-03-22 NOTE — Telephone Encounter (Signed)
Form faxed successfully to [phone] and mailed to pt's home address. Copy sent for scanning. JG//CMA

## 2015-03-26 NOTE — Telephone Encounter (Signed)
Spoke with pt and she voices understanding. Pt was made aware the documents would be re-faxed again once we receive them from scanning department.

## 2015-03-26 NOTE — Telephone Encounter (Signed)
Our copy has not been scanned in yet. The forms were mailed to the pt. Will fax once scanned.

## 2015-03-26 NOTE — Telephone Encounter (Signed)
Pt called in to check the status of her forms. Informed pt of the below. Pt requested to have a copy sent to her. Informed pt that she should be receiving a copy via mail. Pt expressed understanding and says that she will follow up with [facility]

## 2015-03-26 NOTE — Telephone Encounter (Signed)
Patient states fax was never received requesting re fax to a different fax # [phone] please indicate the Policy [id number]

## 2015-03-27 ENCOUNTER — Telehealth (HOSPITAL_COMMUNITY): Payer: Self-pay | Admitting: *Deleted

## 2015-03-28 ENCOUNTER — Ambulatory Visit (HOSPITAL_BASED_OUTPATIENT_CLINIC_OR_DEPARTMENT_OTHER): Payer: 59

## 2015-03-28 ENCOUNTER — Ambulatory Visit (HOSPITAL_COMMUNITY): Payer: 59 | Attending: Cardiology

## 2015-03-28 DIAGNOSIS — R0789 Other chest pain: Secondary | ICD-10-CM

## 2015-03-28 DIAGNOSIS — R002 Palpitations: Secondary | ICD-10-CM | POA: Diagnosis not present

## 2015-03-28 DIAGNOSIS — R0989 Other specified symptoms and signs involving the circulatory and respiratory systems: Secondary | ICD-10-CM

## 2015-03-28 DIAGNOSIS — R079 Chest pain, unspecified: Secondary | ICD-10-CM | POA: Diagnosis not present

## 2015-03-28 DIAGNOSIS — R9431 Abnormal electrocardiogram [ECG] [EKG]: Secondary | ICD-10-CM

## 2015-03-28 DIAGNOSIS — J45909 Unspecified asthma, uncomplicated: Secondary | ICD-10-CM | POA: Insufficient documentation

## 2015-04-02 NOTE — Telephone Encounter (Signed)
Documents still not scanned.

## 2015-04-17 NOTE — Telephone Encounter (Signed)
Forms faxed to [phone] with policy # noted below successfully. JG//CMA

## 2015-05-06 ENCOUNTER — Other Ambulatory Visit: Payer: 59

## 2015-06-26 ENCOUNTER — Telehealth: Payer: Self-pay | Admitting: Behavioral Health

## 2015-06-26 NOTE — Telephone Encounter (Signed)
Assessed the following gaps in care: Microalbumin Pap (past Hx of hysterectomy) Colonoscopy Flu (per health maintenance, pt. declines)  Patient reported that she attends [facility] and the provider will complete colonoscopy in the upcoming year, however she's been treated currently for IBS. Patient declines microalbumin at this time.

## 2015-08-14 ENCOUNTER — Encounter (HOSPITAL_BASED_OUTPATIENT_CLINIC_OR_DEPARTMENT_OTHER): Payer: Self-pay | Admitting: Emergency Medicine

## 2015-08-14 ENCOUNTER — Emergency Department (HOSPITAL_BASED_OUTPATIENT_CLINIC_OR_DEPARTMENT_OTHER)
Admission: EM | Admit: 2015-08-14 | Discharge: 2015-08-14 | Disposition: A | Discharge status: Home / Self Care | Payer: 59 | Attending: Emergency Medicine | Admitting: Emergency Medicine

## 2015-08-14 DIAGNOSIS — S80211A Abrasion, right knee, initial encounter: Secondary | ICD-10-CM | POA: Insufficient documentation

## 2015-08-14 DIAGNOSIS — Z8742 Personal history of other diseases of the female genital tract: Secondary | ICD-10-CM | POA: Insufficient documentation

## 2015-08-14 DIAGNOSIS — W010XXA Fall on same level from slipping, tripping and stumbling without subsequent striking against object, initial encounter: Secondary | ICD-10-CM | POA: Insufficient documentation

## 2015-08-14 DIAGNOSIS — Y990 Civilian activity done for income or pay: Secondary | ICD-10-CM | POA: Insufficient documentation

## 2015-08-14 DIAGNOSIS — S3991XA Unspecified injury of abdomen, initial encounter: Secondary | ICD-10-CM | POA: Insufficient documentation

## 2015-08-14 DIAGNOSIS — Y93F2 Activity, caregiving, lifting: Secondary | ICD-10-CM | POA: Insufficient documentation

## 2015-08-14 DIAGNOSIS — E119 Type 2 diabetes mellitus without complications: Secondary | ICD-10-CM | POA: Insufficient documentation

## 2015-08-14 DIAGNOSIS — Z8719 Personal history of other diseases of the digestive system: Secondary | ICD-10-CM | POA: Diagnosis not present

## 2015-08-14 DIAGNOSIS — Y9289 Other specified places as the place of occurrence of the external cause: Secondary | ICD-10-CM | POA: Insufficient documentation

## 2015-08-14 DIAGNOSIS — S80212A Abrasion, left knee, initial encounter: Secondary | ICD-10-CM | POA: Insufficient documentation

## 2015-08-14 DIAGNOSIS — X500XXA Overexertion from strenuous movement or load, initial encounter: Secondary | ICD-10-CM | POA: Diagnosis not present

## 2015-08-14 DIAGNOSIS — Z79899 Other long term (current) drug therapy: Secondary | ICD-10-CM | POA: Insufficient documentation

## 2015-08-14 DIAGNOSIS — Z7951 Long term (current) use of inhaled steroids: Secondary | ICD-10-CM | POA: Diagnosis not present

## 2015-08-14 DIAGNOSIS — W19XXXA Unspecified fall, initial encounter: Secondary | ICD-10-CM

## 2015-08-14 DIAGNOSIS — Y9389 Activity, other specified: Secondary | ICD-10-CM | POA: Insufficient documentation

## 2015-08-14 DIAGNOSIS — J45909 Unspecified asthma, uncomplicated: Secondary | ICD-10-CM | POA: Diagnosis not present

## 2015-08-14 DIAGNOSIS — M7918 Myalgia, other site: Secondary | ICD-10-CM

## 2015-08-14 DIAGNOSIS — S8991XA Unspecified injury of right lower leg, initial encounter: Secondary | ICD-10-CM | POA: Diagnosis present

## 2015-08-14 DIAGNOSIS — Z87891 Personal history of nicotine dependence: Secondary | ICD-10-CM | POA: Insufficient documentation

## 2015-08-14 HISTORY — DX: Diverticulitis of intestine, part unspecified, without perforation or abscess without bleeding: K57.92

## 2015-08-14 MED ORDER — BACITRACIN ZINC 500 UNIT/GM EX OINT: TOPICAL_OINTMENT | Freq: Two times a day (BID) | CUTANEOUS | Status: DC

## 2015-08-14 NOTE — ED Notes (Signed)
Patient questing work note, Therapist, sports made aware.

## 2015-08-14 NOTE — ED Notes (Signed)
Patient states that she was at work and fell onto carpet with both knees and both hands. Patient is complaining of bilateral knee pain.

## 2015-08-14 NOTE — ED Notes (Signed)
Wound care completed with with bacitracin bilaterally.

## 2015-08-14 NOTE — Discharge Instructions (Signed)
Musculoskeletal Pain Follow up with your doctor within 2 days. Take ibuprofen as needed for pain. Rest. Ice your knees. Musculoskeletal pain is muscle and boney aches and pains. These pains can occur in any part of the body. Your caregiver may treat you without knowing the cause of the pain. They may treat you if blood or urine tests, X-rays, and other tests were normal.  CAUSES There is often not a definite cause or reason for these pains. These pains may be caused by a type of germ (virus). The discomfort may also come from overuse. Overuse includes working out too hard when your body is not fit. Boney aches also come from weather changes. Bone is sensitive to atmospheric pressure changes. HOME CARE INSTRUCTIONS   Ask when your test results will be ready. Make sure you get your test results.  Only take over-the-counter or prescription medicines for pain, discomfort, or fever as directed by your caregiver. If you were given medications for your condition, do not drive, operate machinery or power tools, or sign legal documents for 24 hours. Do not drink alcohol. Do not take sleeping pills or other medications that may interfere with treatment.  Continue all activities unless the activities cause more pain. When the pain lessens, slowly resume normal activities. Gradually increase the intensity and duration of the activities or exercise.  During periods of severe pain, bed rest may be helpful. Lay or sit in any position that is comfortable.  Putting ice on the injured area.  Put ice in a bag.  Place a towel between your skin and the bag.  Leave the ice on for 15 to 20 minutes, 3 to 4 times a day.  Follow up with your caregiver for continued problems and no reason can be found for the pain. If the pain becomes worse or does not go away, it may be necessary to repeat tests or do additional testing. Your caregiver may need to look further for a possible cause. SEEK IMMEDIATE MEDICAL CARE  IF:  You have pain that is getting worse and is not relieved by medications.  You develop chest pain that is associated with shortness or breath, sweating, feeling sick to your stomach (nauseous), or throw up (vomit).  Your pain becomes localized to the abdomen.  You develop any new symptoms that seem different or that concern you. MAKE SURE YOU:   Understand these instructions.  Will watch your condition.  Will get help right away if you are not doing well or get worse.   This information is not intended to replace advice given to you by your health care provider. Make sure you discuss any questions you have with your health care provider.   Document Released: 05/25/2005 Document Revised: 08/17/2011 Document Reviewed: 01/27/2013 Elsevier Interactive Patient Education [employer].

## 2015-08-14 NOTE — ED Provider Notes (Signed)
CSN: [id number]     Arrival date & time 08/14/15  [id number] History   First MD Initiated Contact with Patient 08/14/15 [phone]     Chief Complaint  Patient presents with  . Knee Pain   (Consider location/radiation/quality/duration/timing/severity/associated sxs/prior Treatment) Patient is a 50 y.o. female presenting with knee pain. The history is provided by the patient. No language interpreter was used.  Knee Pain Associated symptoms: no fever     Ms. Enslin is a 50 y.o female with a history of DM who presents with sudden onset bilateral knee and hand pain after trip and fall onto carpet while at work just prior to arrival. She denies a loss of consciousness or head injury. Not on anticoagulation medication. Has fallen multiple times in the past but states she is clumsy. Her tetanus status is up-to-date. States she was able to get up on her own after the fall.  Denies any dizziness or vomiting.   Past Medical History  Diagnosis Date  . Diabetes mellitus without complication ([place])   . Ovarian cyst   . Asthma   . Diverticulitis    Past Surgical History  Procedure Laterality Date  . Cesarean section    . Abdominal hysterectomy    . Cholecystectomy     Family History  Problem Relation Age of Onset  . Diabetes Father   . Heart disease Father    Social History  Substance Use Topics  . Smoking status: Former Research scientist (life sciences)  . Smokeless tobacco: Never Used  . Alcohol Use: No     Comment: rarely   OB History    No data available     Review of Systems  Constitutional: Negative for fever and chills.  Musculoskeletal: Positive for myalgias and arthralgias.  Skin: Positive for wound.  Neurological: Negative for dizziness and headaches.  All other systems reviewed and are negative.     Allergies  Sulfa antibiotics  Home Medications   Prior to Admission medications   Medication Sig Start Date End Date Taking? Authorizing Provider  albuterol (PROVENTIL HFA;VENTOLIN HFA) 108 (90 BASE)  MCG/ACT inhaler Inhale 2 puffs into the lungs every 6 (six) hours as needed for wheezing or shortness of breath. 12/11/14   Brunetta Jeans, PA-C  cetirizine (ZYRTEC) 10 MG tablet Take 10 mg by mouth daily.    Historical Provider, MD  fluticasone (FLONASE) 50 MCG/ACT nasal spray Place 2 sprays into both nostrils daily. 11/14/14   Percell Miller Saguier, PA-C  traMADol (ULTRAM) 50 MG tablet Take 1 tablet (50 mg total) by mouth every 8 (eight) hours as needed. 03/18/15   Edward Saguier, PA-C   BP 120/65 mmHg  Pulse 66  Temp(Src) 98.3 F (36.8 C) (Oral)  Resp 18  Ht 5\' 2"  (1.575 m)  Wt 114.306 kg  BMI 46.08 kg/m2  SpO2 100% Physical Exam  Constitutional: She is oriented to person, place, and time. She appears well-developed and well-nourished.  HENT:  Head: Normocephalic and atraumatic.  Eyes: Conjunctivae are normal.  Neck: Normal range of motion. Neck supple.  Cardiovascular: Normal rate.   Pulmonary/Chest: Effort normal. No respiratory distress.  Musculoskeletal: Normal range of motion.  Able to flex and extend wrist bilaterally. 2+ radial pulse bilaterally. No swelling or deformity of the hands or wrists. Able to flex and extend all fingers.  Bilateral knees: Abrasions just below the patella. No deformity. Minimal tenderness. Laboratory study gait. Able to flex and extend knees. Able to straight leg raise bilaterally. No joint effusion. No fibular head or patellar  tenderness.  Neurological: She is alert and oriented to person, place, and time.  Skin: Skin is warm and dry.  Nursing note and vitals reviewed.   ED Course  Procedures (including critical care time) Labs Review Labs Reviewed - No data to display  Imaging Review No results found. I have personally reviewed and evaluated these image results as part of my medical decision-making.   EKG Interpretation None      MDM   Final diagnoses:  Fall, initial encounter  Musculoskeletal pain  Patient presents for body aches and  bilateral hand and knee pain after fall at work. She is well-appearing and in no acute distress. She was able to ambulate to the bathroom without difficulty. I do not believe she needs imaging at this time. This is most likely musculoskeletal pain. She took ibuprofen approximately 4 hours ago for left groin pain after lifting her grandson. She reports that the fall happened after this. She also states she has ibuprofen at home and can take that for pain. I discussed applying ice to her knees. She was given bacitracin for her abrasions. Her tetanus is up-to-date. I discussed following up with her primary care doctor. Patient agrees with plan.      Ottie Glazier, PA-C 08/14/15 1607  Julianne Rice, MD 08/15/15 1058

## 2015-10-01 ENCOUNTER — Encounter: Payer: Self-pay | Admitting: Physician Assistant

## 2015-10-01 ENCOUNTER — Ambulatory Visit (INDEPENDENT_AMBULATORY_CARE_PROVIDER_SITE_OTHER): Payer: 59 | Admitting: Physician Assistant

## 2015-10-01 VITALS — BP 118/80 | HR 82 | Temp 98.1°F | Ht 63.0 in | Wt 260.6 lb

## 2015-10-01 DIAGNOSIS — M25562 Pain in left knee: Secondary | ICD-10-CM

## 2015-10-01 MED ORDER — MELOXICAM 15 MG PO TABS: 15.0000 mg | ORAL_TABLET | Freq: Every day | ORAL | Status: DC

## 2015-10-01 NOTE — Progress Notes (Signed)
Pre visit review using our clinic review tool, if applicable. No additional management support is needed unless otherwise documented below in the visit note. 

## 2015-10-01 NOTE — Patient Instructions (Signed)
Please wear your knee brace over the next week. Take the Meloxicam daily as directed with food. Use Tylenol for breakthrough pain. Limit long periods of walking. Elevate your leg while resting. Topical Icy Hot may also be beneficial

## 2015-10-01 NOTE — Progress Notes (Signed)
   Patient presents to clinic today c/o left knee pain x 1.5 weeks. Patient with fall onto bilateral knees 6 weeks ago with ER assessment. No imaging performed at that time. Endorses pain at that time completely resolved until 1.5 weeks ago when she twisted knee getting out of her jeep. Pain is anterolateral. Does not radiate. Denies buckling of knees. Endorses some mild swelling. Has taken ibuprofen with minimal relief in symptoms.   Past Medical History  Diagnosis Date  . Diabetes mellitus without complication ([place])   . Ovarian cyst   . Asthma   . Diverticulitis     Current Outpatient Prescriptions on File Prior to Visit  Medication Sig Dispense Refill  . albuterol (PROVENTIL HFA;VENTOLIN HFA) 108 (90 BASE) MCG/ACT inhaler Inhale 2 puffs into the lungs every 6 (six) hours as needed for wheezing or shortness of breath. 1 Inhaler 0  . fluticasone (FLONASE) 50 MCG/ACT nasal spray Place 2 sprays into both nostrils daily. 16 g 1   No current facility-administered medications on file prior to visit.    Allergies  Allergen Reactions  . Sulfa Antibiotics Rash    Family History  Problem Relation Age of Onset  . Diabetes Father   . Heart disease Father     Social History   Social History  . Marital Status: Married    Spouse Name: N/A  . Number of Children: N/A  . Years of Education: N/A   Social History Main Topics  . Smoking status: Former Research scientist (life sciences)  . Smokeless tobacco: Never Used  . Alcohol Use: No     Comment: rarely  . Drug Use: No  . Sexual Activity: No   Other Topics Concern  . None   Social History Narrative   Review of Systems - See HPI.  All other ROS are negative.  BP 118/80 mmHg  Pulse 82  Temp(Src) 98.1 F (36.7 C) (Oral)  Ht 5\' 3"  (1.6 m)  Wt 260 lb 9.6 oz (118.207 kg)  BMI 46.17 kg/m2  SpO2 98%  Physical Exam  Constitutional: She is oriented to person, place, and time and well-developed, well-nourished, and in no distress.  HENT:  Head:  Normocephalic and atraumatic.  Eyes: Conjunctivae are normal.  Neck: Neck supple.  Cardiovascular: Normal rate, regular rhythm, normal heart sounds and intact distal pulses.   Pulmonary/Chest: Effort normal and breath sounds normal. No respiratory distress. She has no wheezes. She has no rales. She exhibits no tenderness.  Musculoskeletal:       Left knee: She exhibits normal range of motion, no swelling, normal alignment, no LCL laxity, normal patellar mobility, normal meniscus and no MCL laxity. Tenderness found. Lateral joint line tenderness noted.  Neurological: She is alert and oriented to person, place, and time.  Skin: Skin is warm and dry. No rash noted.  Vitals reviewed.   No results found for this or any previous visit (from the past 2160 hour(s)).  Assessment/Plan: 1. Left knee pain RICE discussed. Knee sleeve applied to area. Rx Mobic daily with food. Tylenol for breakthrough pain. Supportive measures reviewed. FU 1 week

## 2015-11-06 ENCOUNTER — Ambulatory Visit (INDEPENDENT_AMBULATORY_CARE_PROVIDER_SITE_OTHER): Payer: 59 | Admitting: Medical

## 2015-11-06 ENCOUNTER — Encounter: Payer: Self-pay | Admitting: Medical

## 2015-11-06 VITALS — BP 120/80 | HR 81 | Temp 98.1°F | Ht 63.0 in | Wt 261.4 lb

## 2015-11-06 DIAGNOSIS — M25562 Pain in left knee: Secondary | ICD-10-CM

## 2015-11-06 DIAGNOSIS — E119 Type 2 diabetes mellitus without complications: Secondary | ICD-10-CM | POA: Diagnosis not present

## 2015-11-06 MED ORDER — MELOXICAM 15 MG PO TABS: 15.0000 mg | ORAL_TABLET | Freq: Every day | ORAL | Status: DC

## 2015-11-06 MED ORDER — TRAMADOL HCL 50 MG PO TABS: 50.0000 mg | ORAL_TABLET | Freq: Four times a day (QID) | ORAL | Status: DC | PRN

## 2015-11-06 NOTE — Progress Notes (Signed)
Subjective:    Patient ID: Alisha Stephenson, female    DOB: [birth year], 50 y.o.   MRN: [id number]  HPI  Pt in for follow up on her left knee pain. Pain has been for about one month. Pt states no trauma that she associated with her left knee pain.  But daily her left knee feels stiff. She takes meloxicam and pain is bearable. When she stopped meloxicam she noticed pain was severe.  Pt in states she has been checking her blood sugars recently. Have ranged 250 and 300's. These reading are morning and afternoon. Some fasting readings mid 200'a. No hyperglycemic symptoms reported today.  Pt states she was on victoza daily in the past but in the past she thought her sugars were too low.(pt never had any issues of pancreatitis and no family history of thyroid cancer.     Review of Systems  Constitutional: Negative for fever, chills and fatigue.  Respiratory: Negative for cough, chest tightness, shortness of breath and wheezing.   Cardiovascular: Negative for chest pain and palpitations.  Gastrointestinal: Negative for abdominal pain.  Musculoskeletal: Negative for back pain and gait problem.       Lt knee pain.  Skin: Negative for rash.  Neurological: Negative for facial asymmetry and headaches.  Hematological: Negative for adenopathy. Does not bruise/bleed easily.  Psychiatric/Behavioral: Negative for behavioral problems, confusion and dysphoric mood.    Past Medical History  Diagnosis Date  . Diabetes mellitus without complication ([place])   . Ovarian cyst   . Asthma   . Diverticulitis      Social History   Social History  . Marital Status: Married    Spouse Name: N/A  . Number of Children: N/A  . Years of Education: N/A   Occupational History  . Not on file.   Social History Main Topics  . Smoking status: Former Research scientist (life sciences)  . Smokeless tobacco: Never Used  . Alcohol Use: No     Comment: rarely  . Drug Use: No  . Sexual Activity: No   Other Topics Concern  . Not on file    Social History Narrative    Past Surgical History  Procedure Laterality Date  . Cesarean section    . Abdominal hysterectomy    . Cholecystectomy      Family History  Problem Relation Age of Onset  . Diabetes Father   . Heart disease Father     Allergies  Allergen Reactions  . Sulfa Antibiotics Rash    Current Outpatient Prescriptions on File Prior to Visit  Medication Sig Dispense Refill  . albuterol (PROVENTIL HFA;VENTOLIN HFA) 108 (90 BASE) MCG/ACT inhaler Inhale 2 puffs into the lungs every 6 (six) hours as needed for wheezing or shortness of breath. 1 Inhaler 0  . dicyclomine (BENTYL) 20 MG tablet Take by mouth. Take 1 tablet by mouth every 6 hours as needed.    . fluticasone (FLONASE) 50 MCG/ACT nasal spray Place 2 sprays into both nostrils daily. 16 g 1  . meloxicam (MOBIC) 15 MG tablet Take 1 tablet (15 mg total) by mouth daily. 30 tablet 0   No current facility-administered medications on file prior to visit.    BP 120/80 mmHg  Pulse 81  Temp(Src) 98.1 F (36.7 C) (Oral)  Ht 5\' 3"  (1.6 m)  Wt 261 lb 6.4 oz (118.57 kg)  BMI 46.32 kg/m2  SpO2 98%       Objective:   Physical Exam  General- No acute distress. Pleasant patient. Neck-  Full range of motion, no jvd Lungs- Clear, even and unlabored. Heart- regular rate and rhythm. Neurologic- CNII- XII grossly intact.  Left knee- on rom some crepitus. Medial tibial plateau very tender to palpation.       Assessment & Plan:  For your knee pain continue the meloxicam. I gave refills today.Will go ahead and refer to ortho to evaluate knee pain. May have meniscus injury  For your diabetes will get cmp, a1 and urine micoalbumin today.  After lab review may restart your victoza. Continue diabetic diet and regular exercise as tolerated. Maybe more swimming since your knee pain may prohibit walking.  Follow up to be determined after lab review.  Derrisha Foos, Percell Miller, PA-C

## 2015-11-06 NOTE — Addendum Note (Signed)
Addended by: Anabel Halon on: 11/06/2015 02:55 PM   Modules accepted: Orders

## 2015-11-06 NOTE — Patient Instructions (Signed)
For your knee pain continue the meloxicam. I gave refills today.Will go ahead and refer to ortho to evaluate knee pain. May have meniscus injury  For your diabetes will get cmp, a1 and urine micoalbumin today.  After lab review may restart your victoza. Continue diabetic diet and regular exercise as tolerated. Maybe more swimming since your knee pain may prohibit walking.  Follow up to be determined after lab review.

## 2015-11-06 NOTE — Progress Notes (Signed)
Pre visit review using our clinic review tool, if applicable. No additional management support is needed unless otherwise documented below in the visit note. 

## 2015-11-07 ENCOUNTER — Other Ambulatory Visit: Payer: Self-pay | Admitting: Medical

## 2015-11-07 ENCOUNTER — Telehealth: Payer: Self-pay | Admitting: Medical

## 2015-11-07 LAB — COMPREHENSIVE METABOLIC PANEL
ALK PHOS: 91 U/L (ref 39–117)
ALT: 39 U/L — ABNORMAL HIGH (ref 0–35)
AST: 24 U/L (ref 0–37)
Albumin: 4.2 g/dL (ref 3.5–5.2)
BUN: 15 mg/dL (ref 6–23)
CHLORIDE: 100 meq/L (ref 96–112)
CO2: 33 mEq/L — ABNORMAL HIGH (ref 19–32)
Calcium: 10 mg/dL (ref 8.4–10.5)
Creatinine, Ser: 0.73 mg/dL (ref 0.40–1.20)
GFR: 89.36 mL/min (ref >60.00)
GLUCOSE: 231 mg/dL — AB (ref 70–99)
POTASSIUM: 4.8 meq/L (ref 3.5–5.1)
SODIUM: 140 meq/L (ref 135–145)
TOTAL PROTEIN: 7 g/dL (ref 6.0–8.3)
Total Bilirubin: 0.5 mg/dL (ref 0.2–1.2)

## 2015-11-07 LAB — MICROALBUMIN, URINE

## 2015-11-07 LAB — HEMOGLOBIN A1C: HEMOGLOBIN A1C: 8.2 % — AB (ref 4.6–6.5)

## 2015-11-07 MED ORDER — LIRAGLUTIDE 18 MG/3ML ~~LOC~~ SOPN: 0.6000 mg | PEN_INJECTOR | Freq: Once | SUBCUTANEOUS | Status: DC

## 2015-11-07 NOTE — Telephone Encounter (Signed)
Refilled pt victoza.

## 2015-11-10 IMAGING — MG MM DIGITAL SCREENING BILAT W/ CAD
5 series · 5 of 5 positions shown · non-contrast
Comparison: Previous exam(s)

ACR Breast Density Category a: The breast tissue is almost entirely
fatty.

CLINICAL DATA: Screening.

EXAM:
DIGITAL SCREENING BILATERAL MAMMOGRAM WITH CAD

[R CC]
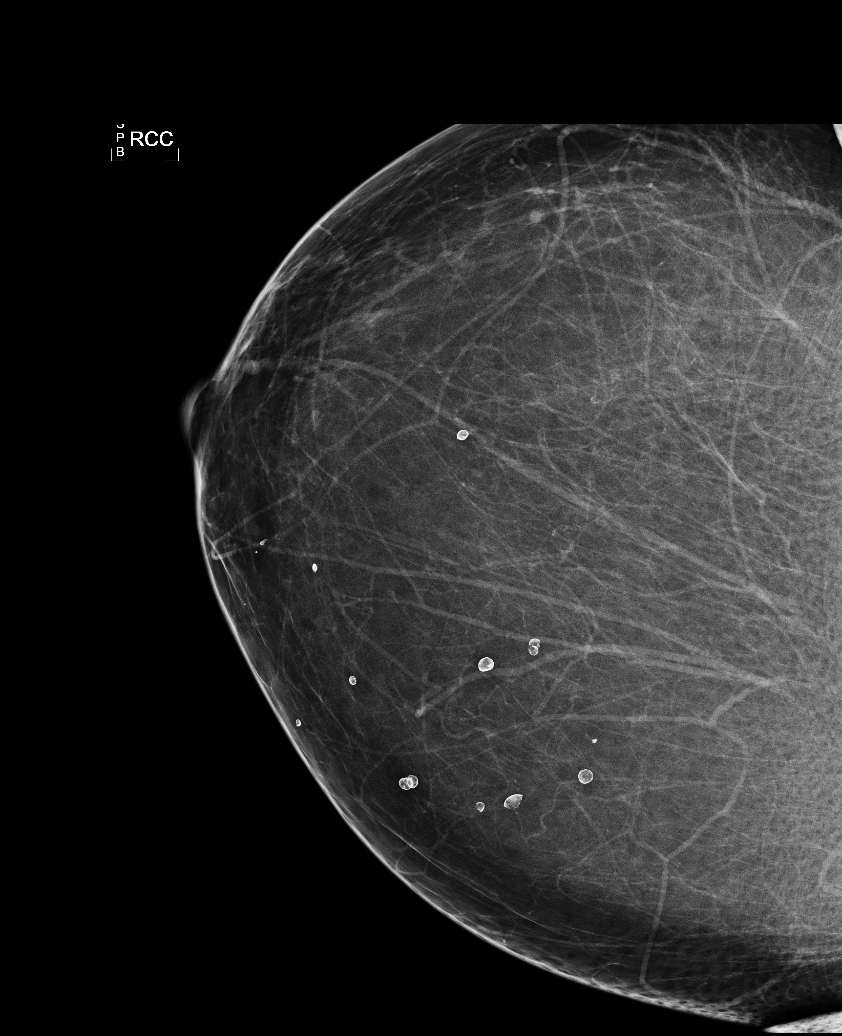

[L CC]
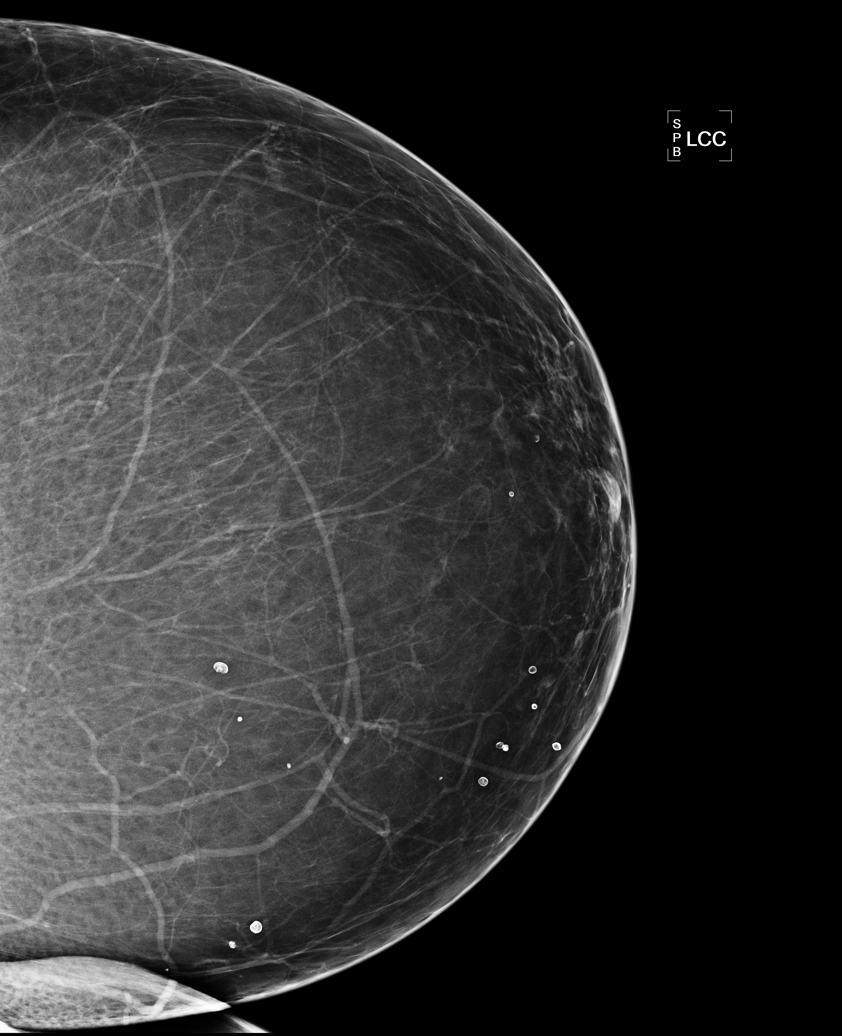

[L MLO]
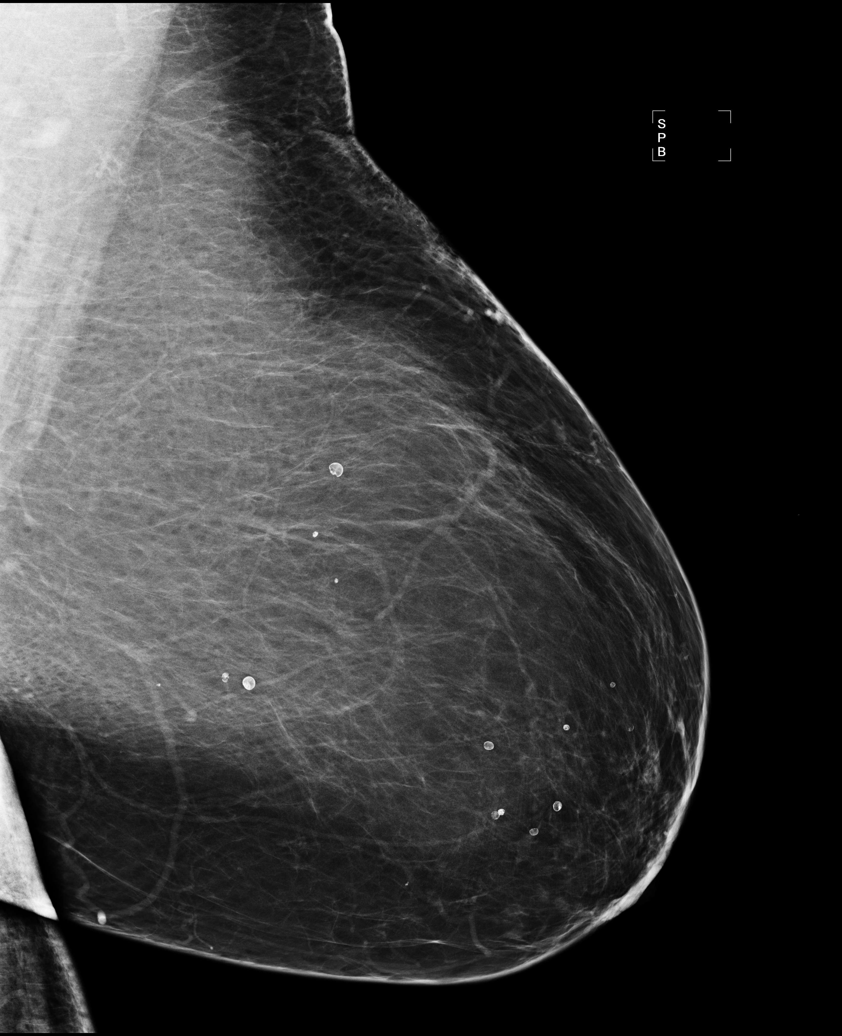

[R MLO]
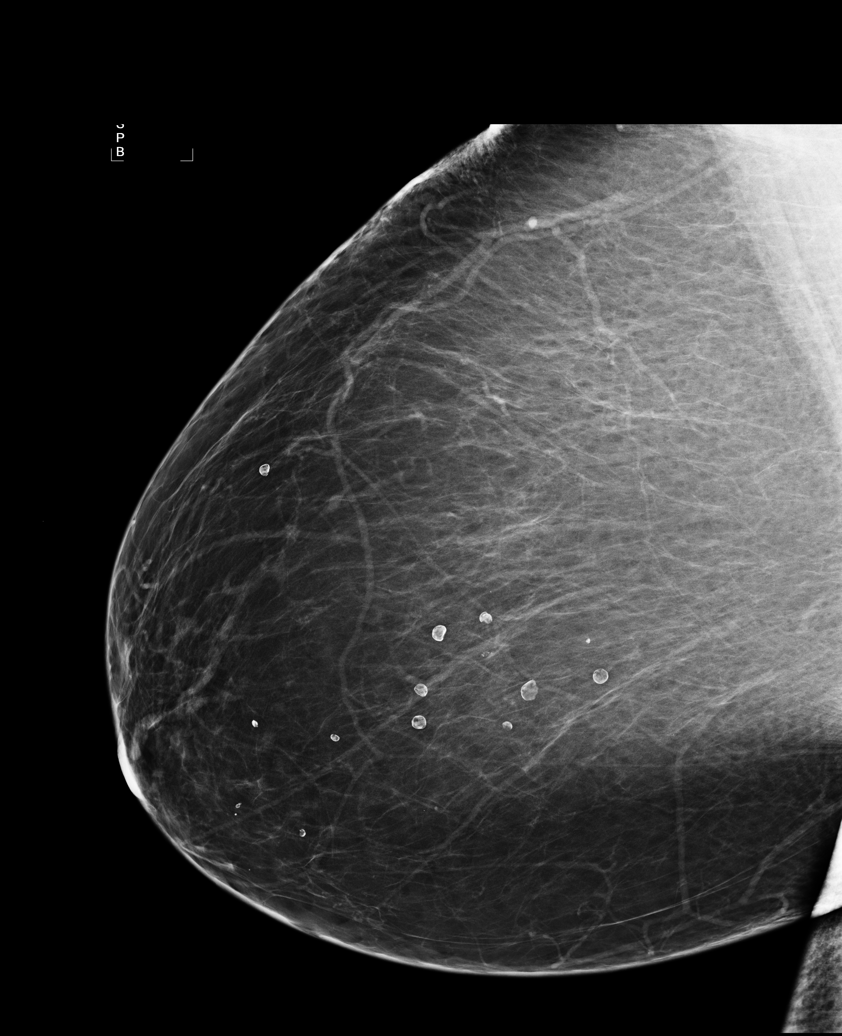

[L CV]
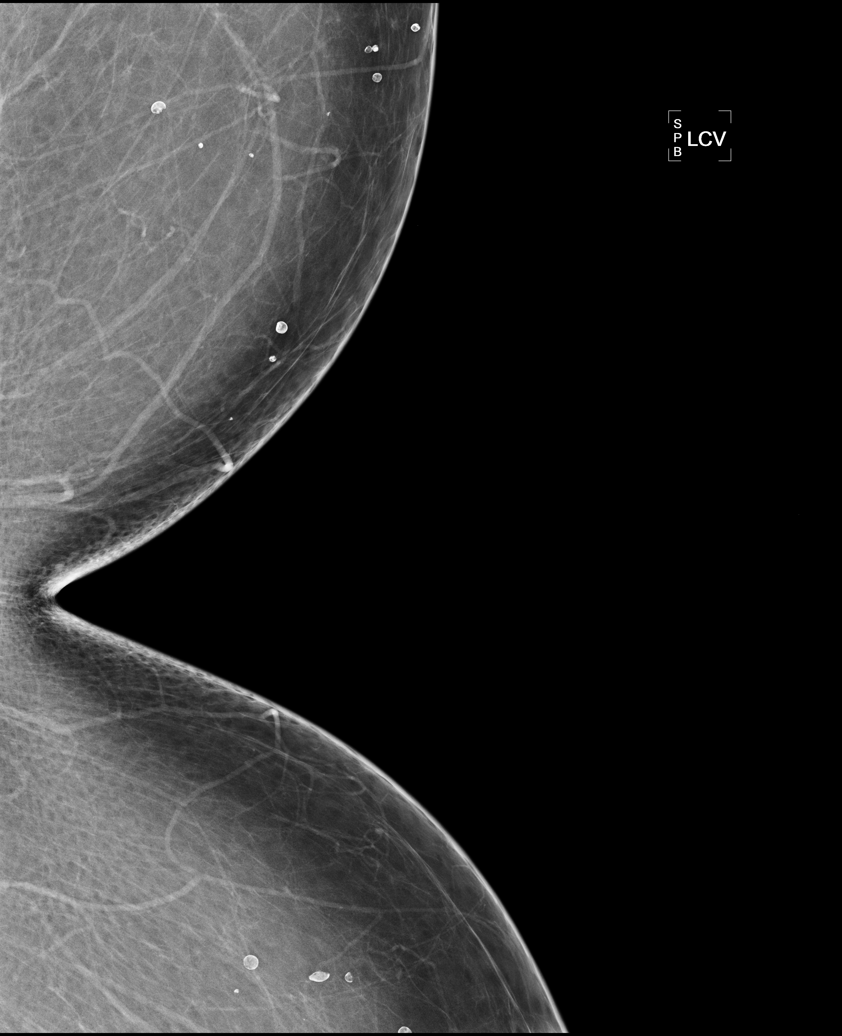

[5 of 5 positions shown; findings below may reference images not displayed]

FINDINGS: There are no findings suspicious for malignancy. Images were
processed with CAD.
IMPRESSION: No mammographic evidence of malignancy. A result letter of this
screening mammogram will be mailed directly to the patient.

RECOMMENDATION:
Screening mammogram in one year. (Code:JV-[id number])

BI-RADS CATEGORY  1: Negative.

## 2015-12-16 ENCOUNTER — Encounter: Payer: Self-pay | Admitting: Medical

## 2015-12-16 ENCOUNTER — Ambulatory Visit (INDEPENDENT_AMBULATORY_CARE_PROVIDER_SITE_OTHER): Payer: 59 | Admitting: Medical

## 2015-12-16 VITALS — BP 120/80 | HR 79 | Temp 98.1°F | Ht 63.0 in | Wt 258.8 lb

## 2015-12-16 DIAGNOSIS — E119 Type 2 diabetes mellitus without complications: Secondary | ICD-10-CM | POA: Diagnosis not present

## 2015-12-16 MED ORDER — LIRAGLUTIDE 18 MG/3ML ~~LOC~~ SOPN: PEN_INJECTOR | SUBCUTANEOUS | Status: DC

## 2015-12-16 NOTE — Addendum Note (Signed)
Addended by: Tasia Catchings on: 12/16/2015 05:57 PM   Modules accepted: Orders, Medications

## 2015-12-16 NOTE — Patient Instructions (Addendum)
For your diabetes will increase your dosage of victoza since your average sugar on machine are still around 190 since I last saw you.  I think this is good idea. Continue to diet and exercise.  We will repeat a1-c at end of august.  Follow up first week September or as needed  See GI on Wed for faint left lower quadrant pain.If worsens before then notify me.

## 2015-12-16 NOTE — Progress Notes (Signed)
Pre visit review using our clinic review tool, if applicable. No additional management support is needed unless otherwise documented below in the visit note. 

## 2015-12-16 NOTE — Addendum Note (Signed)
Addended by: Tasia Catchings on: 12/16/2015 05:35 PM   Modules accepted: Orders, Medications

## 2015-12-16 NOTE — Progress Notes (Signed)
Subjective:    Patient ID: Alisha Stephenson, female    DOB: [birth year], 50 y.o.   MRN: [id number]  HPI  Pt in for follow up. Pt last a1-c was 8.2. Last 14 days 193. Last 30 days sugar average 205. This with pt glucometer check. Pt is on her second month of med. Pt is getting more exercise. Pt is swimming more. Pt diet is better. But admits she was on vacation and was at [facility]. She used to be on higher victoza dose. Willing to increase.     Review of Systems  Constitutional: Negative for fever, chills and fatigue.  Respiratory: Negative for cough, chest tightness and stridor.   Cardiovascular: Negative for chest pain and palpitations.  Gastrointestinal: Negative for abdominal pain.  Neurological: Negative for dizziness, seizures and light-headedness.  Hematological: Negative for adenopathy. Does not bruise/bleed easily.  Psychiatric/Behavioral: Negative for behavioral problems and confusion. The patient is not nervous/anxious.     Past Medical History  Diagnosis Date  . Diabetes mellitus without complication ([place])   . Ovarian cyst   . Asthma   . Diverticulitis      Social History   Social History  . Marital Status: Married    Spouse Name: N/A  . Number of Children: N/A  . Years of Education: N/A   Occupational History  . Not on file.   Social History Main Topics  . Smoking status: Former Research scientist (life sciences)  . Smokeless tobacco: Never Used  . Alcohol Use: No     Comment: rarely  . Drug Use: No  . Sexual Activity: No   Other Topics Concern  . Not on file   Social History Narrative    Past Surgical History  Procedure Laterality Date  . Cesarean section    . Abdominal hysterectomy    . Cholecystectomy      Family History  Problem Relation Age of Onset  . Diabetes Father   . Heart disease Father     Allergies  Allergen Reactions  . Sulfa Antibiotics Rash    Current Outpatient Prescriptions on File Prior to Visit  Medication Sig Dispense Refill  .  albuterol (PROVENTIL HFA;VENTOLIN HFA) 108 (90 BASE) MCG/ACT inhaler Inhale 2 puffs into the lungs every 6 (six) hours as needed for wheezing or shortness of breath. 1 Inhaler 0  . dicyclomine (BENTYL) 20 MG tablet Take by mouth. Take 1 tablet by mouth every 6 hours as needed.    . fluticasone (FLONASE) 50 MCG/ACT nasal spray Place 2 sprays into both nostrils daily. 16 g 1  . meloxicam (MOBIC) 15 MG tablet Take 1 tablet (15 mg total) by mouth daily. 30 tablet 0  . traMADol (ULTRAM) 50 MG tablet Take 1 tablet (50 mg total) by mouth every 6 (six) hours as needed. 12 tablet 0  . VICTOZA 18 MG/3ML SOPN INJECT 0.1 (0.6 MG TOTAL) INTO THE SKIN ONCE DAILY 6 pen 0   No current facility-administered medications on file prior to visit.    BP 120/80 mmHg  Pulse 79  Temp(Src) 98.1 F (36.7 C) (Oral)  Ht 5\' 3"  (1.6 m)  Wt 258 lb 12.8 oz (117.391 kg)  BMI 45.86 kg/m2  SpO2 97%       Objective:   Physical Exam  General Mental Status- Alert. General Appearance- Not in acute distress.   Skin General: Color- Normal Color. Moisture- Normal Moisture.  Neck Carotid Arteries- Normal color. Moisture- Normal Moisture. No carotid bruits. No JVD.  Chest and Lung Exam  Auscultation: Breath Sounds:-Normal.  Cardiovascular Auscultation:Rythm- Regular. Murmurs & Other Heart Sounds:Auscultation of the heart reveals- No Murmurs.  Abdomen Inspection:-Inspeection Normal. Palpation/Percussion:Note:No mass. Palpation and Percussion of the abdomen reveal- Non Tender, Non Distended + BS, no rebound or guarding.  Neurologic Cranial Nerve exam:- CN III-XII intact(No nystagmus), symmetric smile. Strength:- 5/5 equal and symmetric strength both upper and lower extremities.     Assessment & Plan:  For your diabetes will increase your dosage of victoza since your average sugar on machine are still around 190 since I last saw you.  I think this is good idea. Continue to diet and exercise.  We will repeat  a1-c at end of august.  Follow up first week September or as needed

## 2015-12-31 IMAGING — CT CT ABD-PELV W/ CM
2 of 5 series · 17 of 46 positions shown, 19 images · IV contrast (omnipaque)
Comparison: 02/06/2013

CLINICAL DATA: Left lower quadrant pain for 2 weeks

EXAM:
CT ABDOMEN AND PELVIS WITH CONTRAST
TECHNIQUE: Multidetector CT imaging of the abdomen and pelvis was performed
using the standard protocol following bolus administration of
intravenous contrast.
CONTRAST:  100mL OMNIPAQUE IOHEXOL 300 MG/ML  SOLN

[Series 2: abd/pelvis 5.0 b31f · axial · 0.98mm/px · z∈[-516,-116]mm · 14 of 91 slices shown, 16 images]
[im 6/91  soft-tissue]
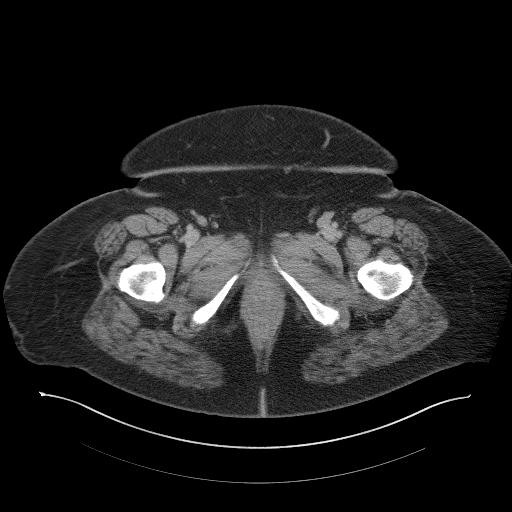
[im 6/91  bone]
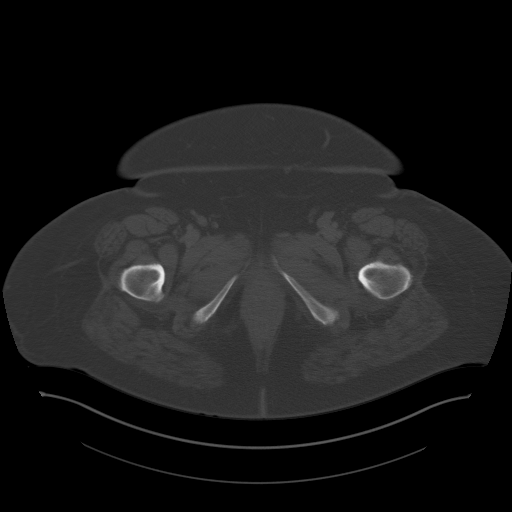
[im 11/91  soft-tissue]
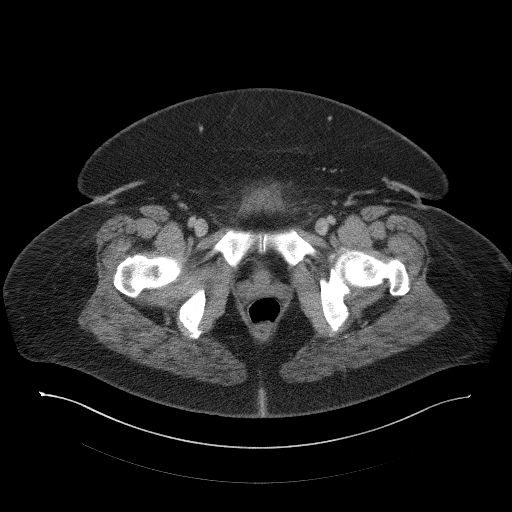
[im 21/91  soft-tissue]
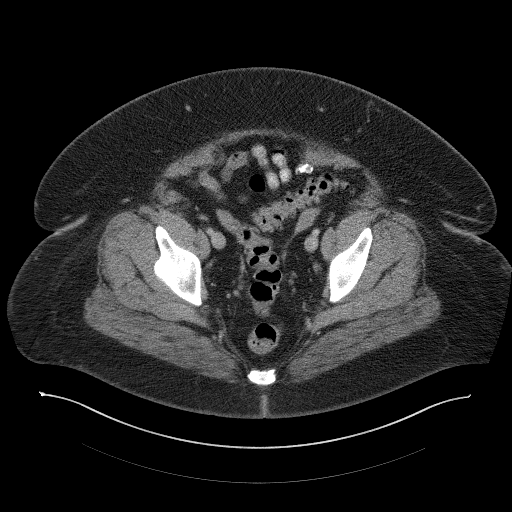
[im 26/91  soft-tissue]
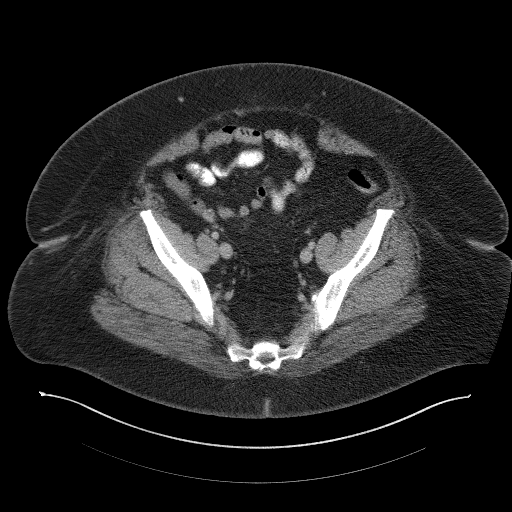
[im 31/91  soft-tissue]
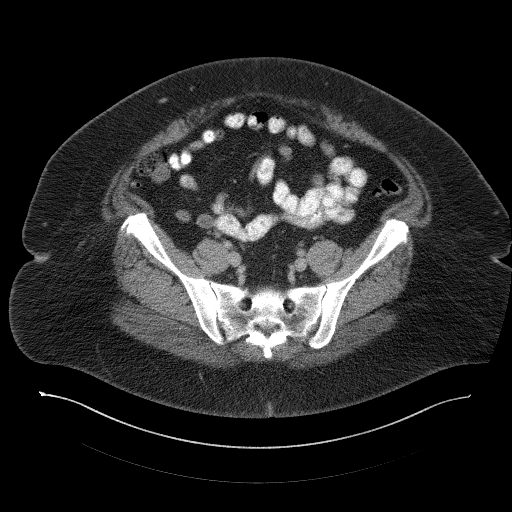
[im 36/91  soft-tissue]
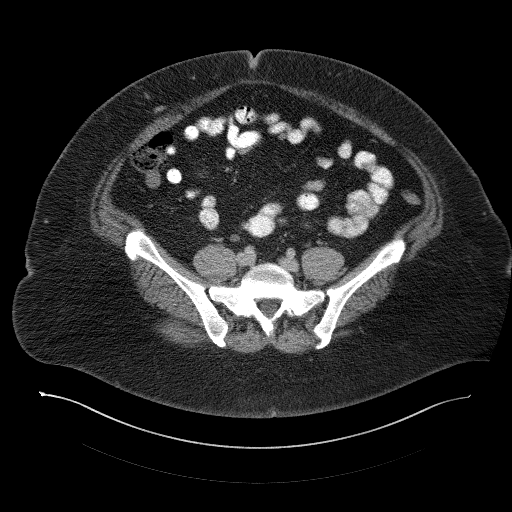
[im 41/91  soft-tissue]
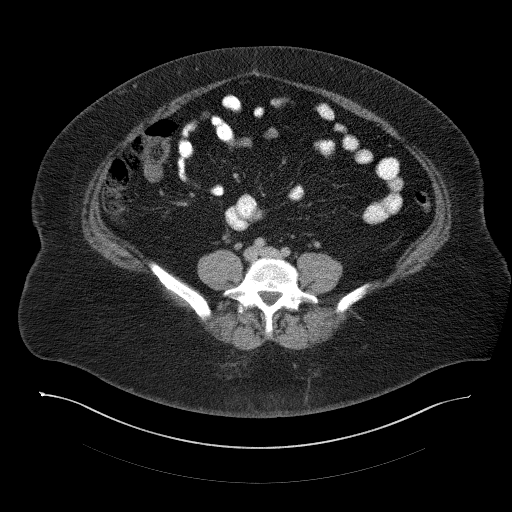
[im 51/91  soft-tissue]
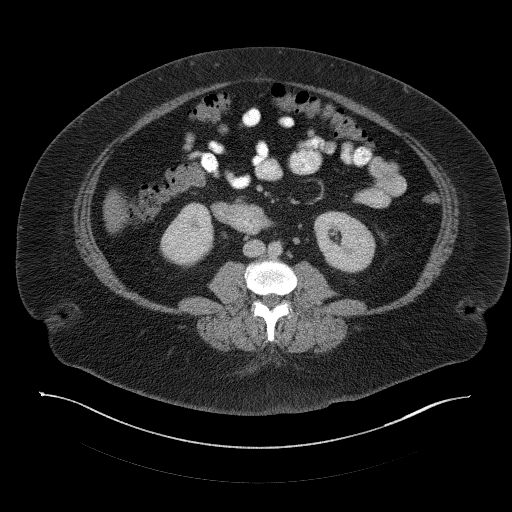
[im 56/91  soft-tissue]
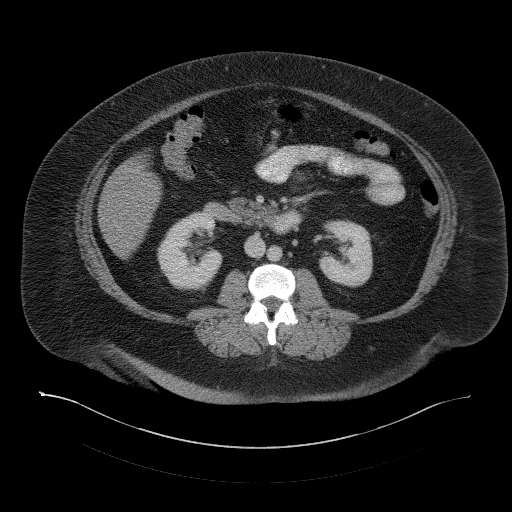
[im 56/91  bone]
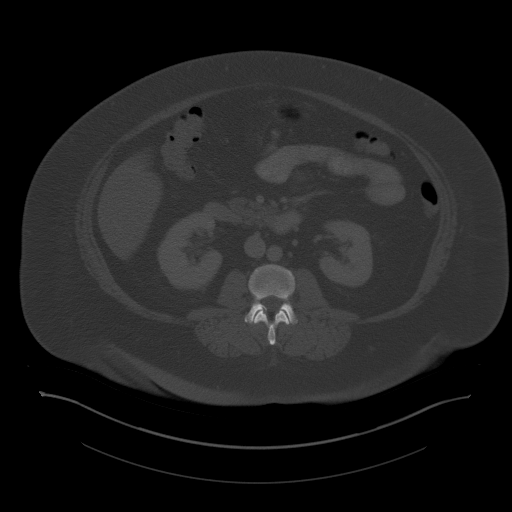
[im 61/91  soft-tissue]
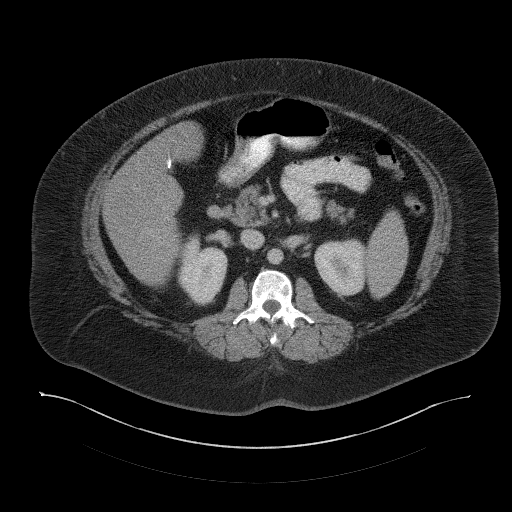
[im 66/91  soft-tissue]
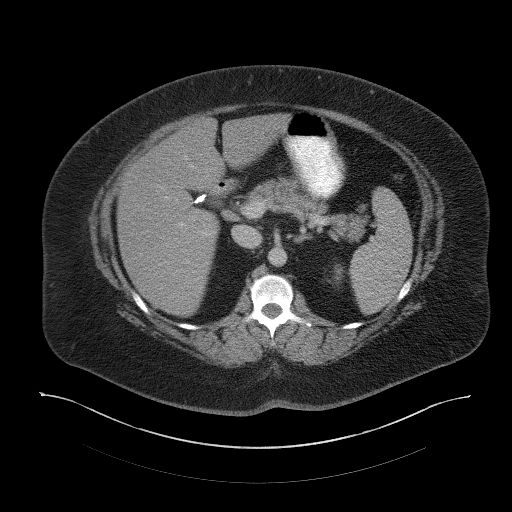
[im 71/91  soft-tissue]
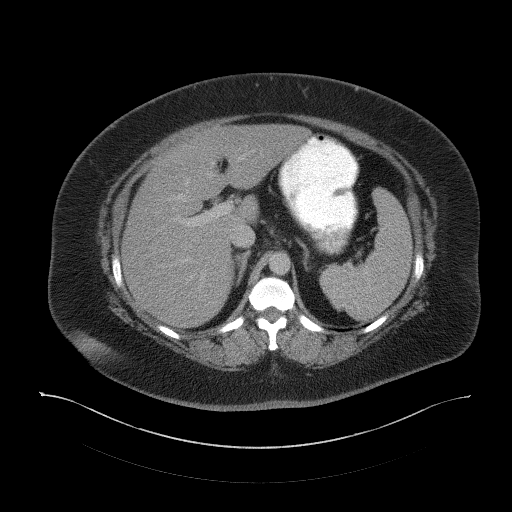
[im 81/91  soft-tissue]
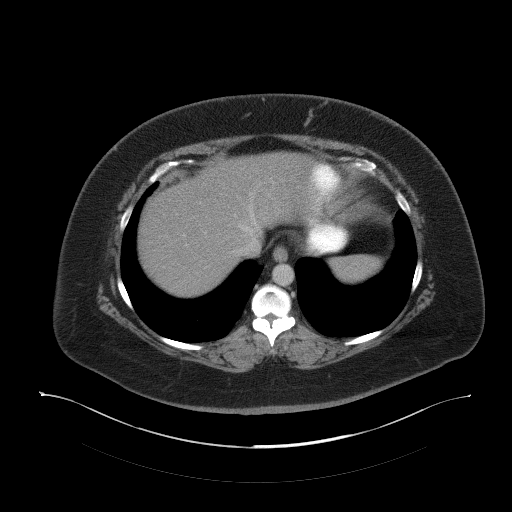
[im 86/91  soft-tissue]
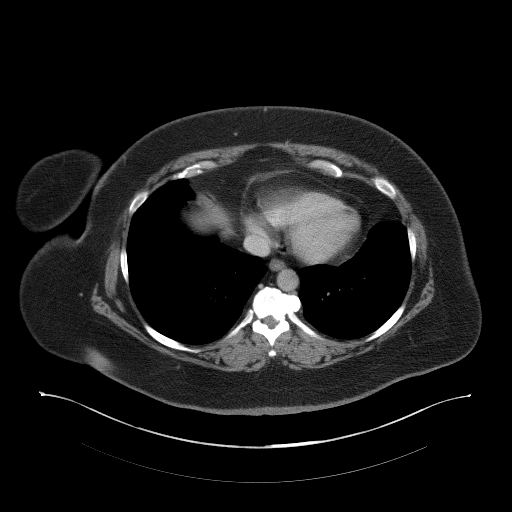

[Series 5: abd/pelvis 3.0 coronal · coronal · 0.90mm/px · 3 of 105 slices shown]
[im 35/105  soft-tissue]
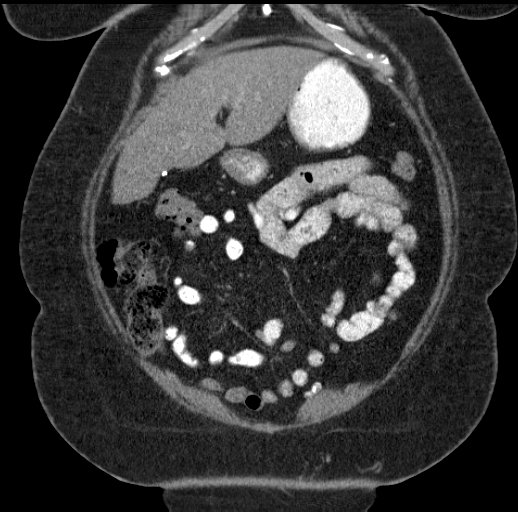
[im 47/105  soft-tissue]
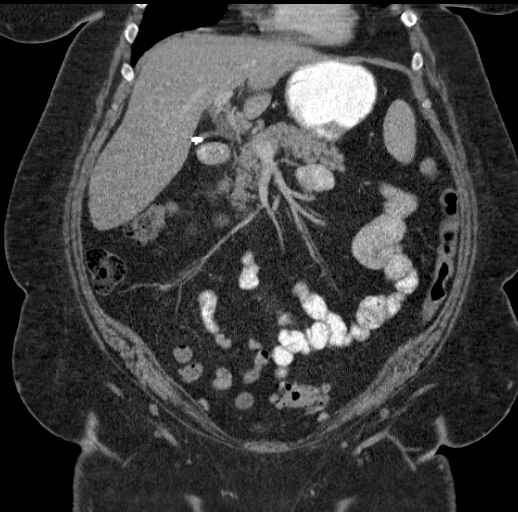
[im 58/105  soft-tissue]
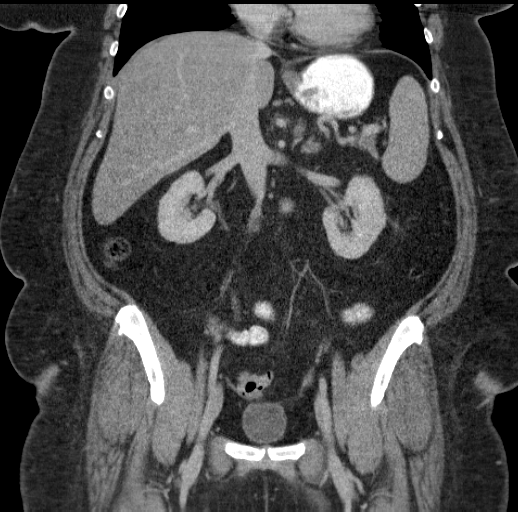

[17 of 46 positions shown; findings below may reference images not displayed]

FINDINGS: Diffuse hepatic steatosis

Postcholecystectomy

Spleen, pancreas, adrenal glands are within normal limits. Stable
small exophytic cyst in the right kidney on image 37. Left kidney is
unremarkable.

Sigmoid diverticulosis without convincing evidence of acute
diverticulitis.

Normal appendix and terminal ileum

Bladder is within normal limits. Uterus is absent. Adnexa are
unremarkable.

No free-fluid.  No abnormal retroperitoneal adenopathy.
IMPRESSION: No acute intra-abdominal process.

## 2016-02-07 ENCOUNTER — Other Ambulatory Visit (INDEPENDENT_AMBULATORY_CARE_PROVIDER_SITE_OTHER): Payer: 59

## 2016-02-07 DIAGNOSIS — E119 Type 2 diabetes mellitus without complications: Secondary | ICD-10-CM

## 2016-02-07 LAB — HEMOGLOBIN A1C: Hgb A1c MFr Bld: 7.4 % — ABNORMAL HIGH (ref 4.6–6.5)

## 2016-02-08 ENCOUNTER — Telehealth: Payer: Self-pay | Admitting: Medical

## 2016-02-09 NOTE — Telephone Encounter (Signed)
Opened to review 

## 2016-02-12 ENCOUNTER — Ambulatory Visit (INDEPENDENT_AMBULATORY_CARE_PROVIDER_SITE_OTHER): Payer: 59 | Admitting: Medical

## 2016-02-12 ENCOUNTER — Encounter: Payer: Self-pay | Admitting: Medical

## 2016-02-12 VITALS — BP 135/80 | HR 67 | Temp 98.3°F | Ht 63.0 in | Wt 268.0 lb

## 2016-02-12 DIAGNOSIS — E118 Type 2 diabetes mellitus with unspecified complications: Secondary | ICD-10-CM | POA: Diagnosis not present

## 2016-02-12 DIAGNOSIS — M7660 Achilles tendinitis, unspecified leg: Secondary | ICD-10-CM

## 2016-02-12 DIAGNOSIS — M67879 Other specified disorders of synovium and tendon, unspecified ankle and foot: Secondary | ICD-10-CM | POA: Diagnosis not present

## 2016-02-12 MED ORDER — LIRAGLUTIDE 18 MG/3ML ~~LOC~~ SOPN: PEN_INJECTOR | SUBCUTANEOUS | 1 refills | Status: DC

## 2016-02-12 MED FILL — VICTOZA 18 MG/3 ML INJECT P: 18 | 30 days supply | Qty: 9 | Fill #0

## 2016-02-12 NOTE — Progress Notes (Signed)
Subjective:    Patient ID: Alisha Stephenson, female    DOB: 1966/04/21, 50 y.o.   MRN: [id number]  HPI   Pt sugars have been better. But this morning it was 300. Does not do this every morning but this is random and not daily.  Pt in past did not like metformin.(pt got sick to her stomach on metformin)  Pt a1-c recently came down to 7.4.   Pt states some achilles tendon pain left side. Hurts when walks. Pt felt pain initially walking up a very steep hill. Not getting better and still hurts.       Review of Systems  Constitutional: Negative for chills, fatigue and fever.  Respiratory: Negative for cough, chest tightness and shortness of breath.   Cardiovascular: Negative for chest pain and palpitations.  Gastrointestinal: Negative for abdominal pain.  Endocrine: Negative for polydipsia, polyphagia and polyuria.  Musculoskeletal:       Left achilles tendon area pain.  Skin: Negative for rash.  Hematological: Negative for adenopathy. Does not bruise/bleed easily.    Past Medical History:  Diagnosis Date  . Asthma   . Diabetes mellitus without complication ([place])   . Diverticulitis   . Ovarian cyst      Social History   Social History  . Marital status: Married    Spouse name: N/A  . Number of children: N/A  . Years of education: N/A   Occupational History  . Not on file.   Social History Main Topics  . Smoking status: Former Research scientist (life sciences)  . Smokeless tobacco: Never Used  . Alcohol use No     Comment: rarely  . Drug use: No  . Sexual activity: No   Other Topics Concern  . Not on file   Social History Narrative  . No narrative on file    Past Surgical History:  Procedure Laterality Date  . ABDOMINAL HYSTERECTOMY    . CESAREAN SECTION    . CHOLECYSTECTOMY      Family History  Problem Relation Age of Onset  . Diabetes Father   . Heart disease Father     Allergies  Allergen Reactions  . Sulfa Antibiotics Rash    Current Outpatient Prescriptions on  File Prior to Visit  Medication Sig Dispense Refill  . albuterol (PROVENTIL HFA;VENTOLIN HFA) 108 (90 BASE) MCG/ACT inhaler Inhale 2 puffs into the lungs every 6 (six) hours as needed for wheezing or shortness of breath. 1 Inhaler 0  . dicyclomine (BENTYL) 20 MG tablet Take by mouth. Take 1 tablet by mouth every 6 hours as needed.    . fluticasone (FLONASE) 50 MCG/ACT nasal spray Place 2 sprays into both nostrils daily. 16 g 1  . meloxicam (MOBIC) 15 MG tablet Take 1 tablet (15 mg total) by mouth daily. 30 tablet 0  . traMADol (ULTRAM) 50 MG tablet Take 1 tablet (50 mg total) by mouth every 6 (six) hours as needed. (Patient not taking: Reported on 02/12/2016) 12 tablet 0   No current facility-administered medications on file prior to visit.     BP 135/80   Pulse 67   Temp 98.3 F (36.8 C) (Oral)   Ht 5\' 3"  (1.6 m)   Wt 268 lb (121.6 kg)   SpO2 100%   BMI 47.47 kg/m       Objective:   Physical Exam  General- No acute distress. Pleasant patient. Neck- Full range of motion, no jvd Lungs- Clear, even and unlabored. Heart- regular rate and rhythm. Neurologic- CNII-  XII grossly intact.  Foot- see diabetic quality metrics. Left ankle- no pain on palpation. Pt has direct pain over achilles tendon. Pain on plantar and dorsi flexion.      Assessment & Plan:  For your diabetes will increase your victoza to 1.8 mg q day. Continue low sugar diet and repeat a1-c in early December.  For your achilles tendon pain use mobic and ace wrap. Refer to podiatrist as well.  Future a1c, cmp, and lipid panel fasting.  Follow up in 3 months or as needed

## 2016-02-12 NOTE — Patient Instructions (Addendum)
For your diabetes will increase your victoza to 1.8 mg  q day. Continue low sugar diet and repeat a1-c in early December.  For your achilles tendon pain use mobic and ace wrap. Refer to podiatrist as well.  Future a1c, cmp, and lipid panel fasting.  Follow up in 3 months or as needed

## 2016-02-24 ENCOUNTER — Ambulatory Visit (INDEPENDENT_AMBULATORY_CARE_PROVIDER_SITE_OTHER): Payer: 59 | Admitting: Podiatry

## 2016-02-24 ENCOUNTER — Encounter: Payer: Self-pay | Admitting: Podiatry

## 2016-02-24 ENCOUNTER — Ambulatory Visit (INDEPENDENT_AMBULATORY_CARE_PROVIDER_SITE_OTHER): Payer: 59

## 2016-02-24 VITALS — BP 143/77 | HR 88 | Resp 16 | Ht 63.0 in | Wt 265.0 lb

## 2016-02-24 DIAGNOSIS — M7662 Achilles tendinitis, left leg: Secondary | ICD-10-CM

## 2016-02-24 DIAGNOSIS — M79672 Pain in left foot: Secondary | ICD-10-CM

## 2016-02-24 MED ORDER — TRIAMCINOLONE ACETONIDE 10 MG/ML IJ SUSP
10.0000 mg | Freq: Once | INTRAMUSCULAR | Status: AC
  Administered 2016-02-24: 10 mg

## 2016-02-24 MED ORDER — MELOXICAM 15 MG PO TABS: 15.0000 mg | ORAL_TABLET | Freq: Every day | ORAL | 2 refills | Status: DC

## 2016-02-24 NOTE — Patient Instructions (Signed)

## 2016-02-24 NOTE — Progress Notes (Signed)
   Subjective:    Patient ID: Alisha Stephenson, female    DOB: 08-15-1965, 50 y.o.   MRN: [id number]  HPI  Chief Complaint  Patient presents with  . Foot Pain    left achilles tendon x 3 wks.  States she felt something unravel while walking up a hill.         Review of Systems  HENT: Positive for congestion.   Musculoskeletal: Positive for gait problem.  All other systems reviewed and are negative.      Objective:   Physical Exam        Assessment & Plan:

## 2016-02-25 NOTE — Progress Notes (Signed)
Subjective:     Patient ID: Alisha Stephenson, female   DOB: [birth year], 50 y.o.   MRN: [id number]  HPI patient presents with exquisite discomfort in the posterior aspect of the left heel Achilles tendon lateral side and states that she felt an injury when she was going up a hill several months ago   Review of Systems  All other systems reviewed and are negative.      Objective:   Physical Exam  Constitutional: She is oriented to person, place, and time.  Cardiovascular: Intact distal pulses.   Musculoskeletal: Normal range of motion.  Neurological: She is oriented to person, place, and time.  Skin: Skin is warm.  Nursing note and vitals reviewed.  neurovascular status found to be intact muscle strength adequate range of motion within normal limits with patient found to have inflammation and pain in the posterior lateral aspect of the left Achilles at the insertion. The central and medial to be fine and I did note the tendon is intact and I did not note any tears and I did check versus the other Achilles tendon. Patient's noted to have good digital perfusion and is well oriented 3     Assessment:     Inflammatory Achilles tendinitis secondary to injury with no current indication of tear    Plan:     H&P condition discussed at great length. I did discuss it's possible that there is a subtle tear but I'm not seen a clinically and I do believe it's a significant inflammatory event. I discussed injection treatment along with immobilization and explain the risk of procedure and chances for rupture. Patient wants this and understands risk and today I carefully injected the lateral side 3 mg dexamethasone Kenalog 5 mg Xylocaine and applied a air fracture walker to immobilize and placed on anti-inflammatory and gradual exercises over the next 3 weeks. Reappoint to recheck 3 weeks or earlier if needed  X-ray indicated significant posterior spurring of the left calcaneus with no indications of  arthritis or stress fracture

## 2016-03-09 ENCOUNTER — Other Ambulatory Visit: Payer: Self-pay | Admitting: Medical

## 2016-03-09 DIAGNOSIS — Z1231 Encounter for screening mammogram for malignant neoplasm of breast: Secondary | ICD-10-CM

## 2016-03-11 ENCOUNTER — Ambulatory Visit (HOSPITAL_BASED_OUTPATIENT_CLINIC_OR_DEPARTMENT_OTHER)
Admission: RE | Admit: 2016-03-11 | Discharge: 2016-03-11 | Disposition: A | Discharge status: Home / Self Care | Payer: 59 | Source: Ambulatory Visit | Attending: Medical | Admitting: Medical

## 2016-03-11 DIAGNOSIS — Z1231 Encounter for screening mammogram for malignant neoplasm of breast: Secondary | ICD-10-CM | POA: Diagnosis not present

## 2016-03-16 ENCOUNTER — Ambulatory Visit (INDEPENDENT_AMBULATORY_CARE_PROVIDER_SITE_OTHER): Payer: 59 | Admitting: Podiatry

## 2016-03-16 ENCOUNTER — Encounter: Payer: Self-pay | Admitting: Podiatry

## 2016-03-16 DIAGNOSIS — M79672 Pain in left foot: Secondary | ICD-10-CM | POA: Diagnosis not present

## 2016-03-16 DIAGNOSIS — M7662 Achilles tendinitis, left leg: Secondary | ICD-10-CM | POA: Diagnosis not present

## 2016-03-17 NOTE — Progress Notes (Signed)
Subjective:     Patient ID: Alisha Stephenson, female   DOB: 1965-09-10, 50 y.o.   MRN: [id number]  HPI patient presents stating I'm improved but still having occasional discomfort   Review of Systems     Objective:   Physical Exam Neurovascular status intact with discomfort in the left posterior heel that's improved but still present    Assessment:     Achilles tendinitis left with inflammation    Plan:     Advised on physical therapy anti-inflammatories and supportive shoes along with heel elevation. Reappoint if symptoms persist

## 2016-03-26 MED FILL — VICTOZA 18 MG/3 ML INJECT P: 18 | 30 days supply | Qty: 9 | Fill #1

## 2016-04-03 ENCOUNTER — Telehealth: Payer: Self-pay | Admitting: Medical

## 2016-04-03 NOTE — Telephone Encounter (Signed)
Patient called upset she was sent to collections for outstanding bill. DOS: 03/18/15 and 02/14/15... Billing providers need to be changes to [facility] Schanz for these visits and resubmitted for payment.  I will up date patient when I have completion message from charge corrections. [phone]

## 2016-04-08 ENCOUNTER — Encounter: Payer: Self-pay | Admitting: Medical

## 2016-04-08 ENCOUNTER — Ambulatory Visit (INDEPENDENT_AMBULATORY_CARE_PROVIDER_SITE_OTHER): Payer: 59 | Admitting: Medical

## 2016-04-08 VITALS — BP 124/82 | HR 78 | Temp 97.7°F | Ht 63.0 in | Wt 269.0 lb

## 2016-04-08 DIAGNOSIS — L089 Local infection of the skin and subcutaneous tissue, unspecified: Secondary | ICD-10-CM

## 2016-04-08 DIAGNOSIS — H00015 Hordeolum externum left lower eyelid: Secondary | ICD-10-CM

## 2016-04-08 MED ORDER — TOBRAMYCIN 0.3 % OP SOLN: 1.0000 [drp] | Freq: Four times a day (QID) | OPHTHALMIC | 0 refills | Status: DC

## 2016-04-08 MED ORDER — DOXYCYCLINE HYCLATE 100 MG PO TABS: 100.0000 mg | ORAL_TABLET | Freq: Two times a day (BID) | ORAL | 0 refills | Status: DC

## 2016-04-08 MED ORDER — ONDANSETRON 8 MG PO TBDP: 8.0000 mg | ORAL_TABLET | Freq: Three times a day (TID) | ORAL | 0 refills | Status: DC | PRN

## 2016-04-08 MED FILL — TOBRAMYCIN 0.3% EYE DROPS: 0.3 | 25 days supply | Qty: 5 | Fill #0

## 2016-04-08 MED FILL — DOXYCYCLINE HYCLATE 100 MG: 100 | 10 days supply | Qty: 20 | Fill #0

## 2016-04-08 MED FILL — ONDANSETRON ODT 8 MG TABLET: 8 | 4 days supply | Qty: 12 | Fill #0

## 2016-04-08 NOTE — Progress Notes (Signed)
Subjective:    Patient ID: Alisha Stephenson, female    DOB: Mar 30, 1966, 50 y.o.   MRN: [id number]  HPI   Pt in with some eye lower lid area pain. Pain since Sunday. Pt had some has lower eye lid rim/eye lash area pain when woke up and has persisted since sunday. Pt has been doing hot compresses and otc stye away med. This am some green discharge medial canthus area.  This am some swelling of left lower lid to cheek. But since then swelling went down.   Review of Systems  Constitutional: Negative for chills, fatigue and fever.  HENT: Negative for congestion, ear pain and sinus pressure.   Eyes: Negative for photophobia, pain, discharge, redness, itching and visual disturbance.       Left lower eye lid pain. And swelling of the lid.  Respiratory: Negative for cough, shortness of breath and wheezing.   Cardiovascular: Negative for palpitations.  Gastrointestinal: Negative for abdominal pain.  Musculoskeletal: Negative for back pain.  Skin: Negative for rash.  Neurological: Negative for dizziness and headaches.  Hematological: Negative for adenopathy. Does not bruise/bleed easily.  Psychiatric/Behavioral: Negative for behavioral problems and confusion.    Past Medical History:  Diagnosis Date  . Asthma   . Diabetes mellitus without complication ([place])   . Diverticulitis   . Ovarian cyst      Social History   Social History  . Marital status: Married    Spouse name: N/A  . Number of children: N/A  . Years of education: N/A   Occupational History  . Not on file.   Social History Main Topics  . Smoking status: Former Research scientist (life sciences)  . Smokeless tobacco: Never Used  . Alcohol use No     Comment: rarely  . Drug use: No  . Sexual activity: No   Other Topics Concern  . Not on file   Social History Narrative  . No narrative on file    Past Surgical History:  Procedure Laterality Date  . ABDOMINAL HYSTERECTOMY    . CESAREAN SECTION    . CHOLECYSTECTOMY      Family History   Problem Relation Age of Onset  . Diabetes Father   . Heart disease Father     Allergies  Allergen Reactions  . Sulfa Antibiotics Rash    Current Outpatient Prescriptions on File Prior to Visit  Medication Sig Dispense Refill  . albuterol (PROVENTIL HFA;VENTOLIN HFA) 108 (90 BASE) MCG/ACT inhaler Inhale 2 puffs into the lungs every 6 (six) hours as needed for wheezing or shortness of breath. 1 Inhaler 0  . dicyclomine (BENTYL) 20 MG tablet Take by mouth. Take 1 tablet by mouth every 6 hours as needed.    . Liraglutide (VICTOZA) 18 MG/3ML SOPN 1.8 mg into skin once daily 6 pen 1  . traMADol (ULTRAM) 50 MG tablet Take 1 tablet (50 mg total) by mouth every 6 (six) hours as needed. 12 tablet 0   No current facility-administered medications on file prior to visit.     BP 124/82 (BP Location: Left Arm, Patient Position: Sitting)   Pulse 78   Temp 97.7 F (36.5 C) (Oral)   Ht 5\' 3"  (1.6 m)   Wt 269 lb (122 kg)   SpO2 98%   BMI 47.65 kg/m       Objective:   Physical Exam   General  Mental Status - Alert. General Appearance - Well groomed. Not in acute distress.  Skin Rashes- No Rashes.  HEENT  Head- Normal.  Eye Sclera/Conjunctiva- Left- Normal. Right- Normal. Left lower eye lid mild swollen and faint pinkish red. Medial aspect of lid at eyelash area shows moderate sized stye. Nose & Sinuses Nasal Mucosa- Left-  Not Boggy and not  Congested. Right-  Not Boggy and not  Congested.Bilateral no maxillary and  No frontal sinus pressure.   Neck Neck- Supple. No Masses.  Chest and Lung Exam Auscultation: Breath Sounds:-Clear even and unlabored.  Cardiovascular Auscultation:Rythm- Regular, rate and rhythm. Murmurs & Other Heart Sounds:Ausculatation of the heart reveal- No Murmurs.  Lymphatic Head & Neck General Head & Neck Lymphatics: Bilateral: Description- No Localized lymphadenopathy.      Assessment & Plan:  For stye advise warm compresses twice daily and  will rx tobrex eye drops.  For skin infection of lower lid will rx doxycycline antibiotic.  For pain and inflammation use ibuprofen.  Area should gradually get better but if not notify us.  Follow up in 2-5 days or as needed  Refilled pt zofran at her request. She has ibs. Gi filled in past.  Mackie Pai, PA-C

## 2016-04-08 NOTE — Telephone Encounter (Signed)
  Will offer pt flu vaccine. She was in for acute visit regarding eye. She can come in later for nurse flu vaccine visit if she wants.

## 2016-04-08 NOTE — Progress Notes (Signed)
Pre visit review using our clinic review tool, if applicable. No additional management support is needed unless otherwise documented below in the visit note. 

## 2016-04-08 NOTE — Patient Instructions (Addendum)
For stye advise warm compresses twice daily and will rx tobrex eye drops.  For skin infection of lower lid will rx doxycycline antibiotic.  For pain and inflammation  use ibuprofen otc  Area should gradually get better but if not notify us.  Follow up in 2-5 days or as needed

## 2016-04-09 NOTE — Telephone Encounter (Signed)
Update: bill his been resubmitted and out for payment to insurance. They have contacted collections to pull the bill. Patient can disregard bill. Left patient a message to call back for update.

## 2016-04-09 NOTE — Telephone Encounter (Signed)
Called patient.  She says she will call back next week to schedule flu shot.

## 2016-05-04 ENCOUNTER — Ambulatory Visit (INDEPENDENT_AMBULATORY_CARE_PROVIDER_SITE_OTHER): Payer: 59 | Admitting: Medical

## 2016-05-04 ENCOUNTER — Encounter: Payer: Self-pay | Admitting: Medical

## 2016-05-04 VITALS — BP 116/78 | HR 68 | Temp 97.8°F | Ht 63.0 in | Wt 269.0 lb

## 2016-05-04 DIAGNOSIS — J029 Acute pharyngitis, unspecified: Secondary | ICD-10-CM

## 2016-05-04 DIAGNOSIS — R059 Cough, unspecified: Secondary | ICD-10-CM

## 2016-05-04 DIAGNOSIS — J0110 Acute frontal sinusitis, unspecified: Secondary | ICD-10-CM | POA: Diagnosis not present

## 2016-05-04 DIAGNOSIS — R05 Cough: Secondary | ICD-10-CM

## 2016-05-04 LAB — POCT RAPID STREP A (OFFICE): RAPID STREP A SCREEN: NEGATIVE

## 2016-05-04 MED ORDER — BENZONATATE 100 MG PO CAPS: 100.0000 mg | ORAL_CAPSULE | Freq: Three times a day (TID) | ORAL | 0 refills | Status: DC | PRN

## 2016-05-04 MED ORDER — ALBUTEROL SULFATE HFA 108 (90 BASE) MCG/ACT IN AERS: 2.0000 | INHALATION_SPRAY | Freq: Four times a day (QID) | RESPIRATORY_TRACT | 2 refills | Status: DC | PRN

## 2016-05-04 MED ORDER — AZITHROMYCIN 250 MG PO TABS: ORAL_TABLET | ORAL | 0 refills | Status: DC

## 2016-05-04 MED ORDER — FLUTICASONE PROPIONATE 50 MCG/ACT NA SUSP: 2.0000 | Freq: Every day | NASAL | 1 refills | Status: DC

## 2016-05-04 MED FILL — AZITHROMYCIN 250 MG TABLET: 250 | 5 days supply | Qty: 6 | Fill #0

## 2016-05-04 MED FILL — FLUTICASONE PROP 50 MCG SPR: 50 | 30 days supply | Qty: 16 | Fill #0

## 2016-05-04 MED FILL — VENTOLIN HFA 90 MCG INHALER: 108 (90 BAS | 30 days supply | Qty: 18 | Fill #0

## 2016-05-04 MED FILL — BENZONATATE 100 MG CAPSULE: 100 | 7 days supply | Qty: 21 | Fill #0

## 2016-05-04 NOTE — Progress Notes (Signed)
Subjective:    Patient ID: Alisha Stephenson, female    DOB: [birth year], 50 y.o.   MRN: [id number]  HPI  Pt in for nasal congestion, chest congestion and st since Friday.   Nasal congestion worse than chest congestion.  When blows nose get mucous and blood tinged mucous. Pt states friend of her recently had sinus infection  Pt strep test was negative.  Pt wants to get better before Saturday work party.   Review of Systems  Constitutional: Negative for chills, fatigue and fever.  HENT: Positive for ear pain, sinus pain and sinus pressure. Negative for postnasal drip and sneezing.        Lt ear pressure.  Respiratory: Positive for cough. Negative for chest tightness, shortness of breath and wheezing.        Mostly rare dry cough.  Cardiovascular: Negative for chest pain and palpitations.  Gastrointestinal: Negative for abdominal pain.  Neurological: Negative for dizziness, tremors, weakness, light-headedness and headaches.  Hematological: Negative for adenopathy. Does not bruise/bleed easily.  Psychiatric/Behavioral: Negative for confusion.   Past Medical History:  Diagnosis Date  . Asthma   . Diabetes mellitus without complication ([place])   . Diverticulitis   . Ovarian cyst      Social History   Social History  . Marital status: Married    Spouse name: N/A  . Number of children: N/A  . Years of education: N/A   Occupational History  . Not on file.   Social History Main Topics  . Smoking status: Former Research scientist (life sciences)  . Smokeless tobacco: Never Used  . Alcohol use No     Comment: rarely  . Drug use: No  . Sexual activity: No   Other Topics Concern  . Not on file   Social History Narrative  . No narrative on file    Past Surgical History:  Procedure Laterality Date  . ABDOMINAL HYSTERECTOMY    . CESAREAN SECTION    . CHOLECYSTECTOMY      Family History  Problem Relation Age of Onset  . Diabetes Father   . Heart disease Father     Allergies  Allergen  Reactions  . Sulfa Antibiotics Rash    Current Outpatient Prescriptions on File Prior to Visit  Medication Sig Dispense Refill  . dicyclomine (BENTYL) 20 MG tablet Take by mouth. Take 1 tablet by mouth every 6 hours as needed.    . doxycycline (VIBRA-TABS) 100 MG tablet Take 1 tablet (100 mg total) by mouth 2 (two) times daily. 20 tablet 0  . Liraglutide (VICTOZA) 18 MG/3ML SOPN 1.8 mg into skin once daily 6 pen 1  . ondansetron (ZOFRAN ODT) 8 MG disintegrating tablet Take 1 tablet (8 mg total) by mouth every 8 (eight) hours as needed for nausea or vomiting. 12 tablet 0  . tobramycin (TOBREX) 0.3 % ophthalmic solution Place 1 drop into the left eye every 6 (six) hours. 5 mL 0  . traMADol (ULTRAM) 50 MG tablet Take 1 tablet (50 mg total) by mouth every 6 (six) hours as needed. 12 tablet 0   No current facility-administered medications on file prior to visit.     BP 116/78 (BP Location: Left Arm, Patient Position: Sitting, Cuff Size: Normal)   Pulse 68   Temp 97.8 F (36.6 C) (Oral)   Ht 5\' 3"  (1.6 m)   Wt 269 lb (122 kg)   SpO2 98%   BMI 47.65 kg/m       Objective:   Physical Exam  General  Mental Status - Alert. General Appearance - Well groomed. Not in acute distress.  Skin Rashes- No Rashes.  HEENT Head- Normal. Ear Auditory Canal - Left- Normal. Right - Normal.Tympanic Membrane- Left- fait dull rd tm Right- Normal. Eye Sclera/Conjunctiva- Left- Normal. Right- Normal. Nose & Sinuses Nasal Mucosa- Left-  Boggy and Congested. Right-  Boggy and  Congested.Bilateral maxillary and frontal sinus pressure. Mouth & Throat Lips: Upper Lip- Normal: no dryness, cracking, pallor, cyanosis, or vesicular eruption. Lower Lip-Normal: no dryness, cracking, pallor, cyanosis or vesicular eruption. Buccal Mucosa- Bilateral- No Aphthous ulcers. Oropharynx- No Discharge or Erythema. +pnd. Tonsils: Characteristics- Bilateral- No Erythema or Congestion. Size/Enlargement- Bilateral- No  enlargement. Discharge- bilateral-None.  Neck Neck- Supple. No Masses.   Chest and Lung Exam Auscultation: Breath Sounds:-Clear even and unlabored.  Cardiovascular Auscultation:Rythm- Regular, rate and rhythm. Murmurs & Other Heart Sounds:Ausculatation of the heart reveal- No Murmurs.  Lymphatic Head & Neck General Head & Neck Lymphatics: Bilateral: Description- No Localized lymphadenopathy.       Assessment & Plan:  You appear to have a sinus infection. I am prescribing  azithomcyin antibiotic for the infection. To help with the nasal congestion I prescribed nasal steroid flonase. For your associated cough, I prescribed cough medicine benzonatate.  Rest, hydrate, tylenol for fever.  Rapid strep test was negative.  Follow up in 7 days or as needed.

## 2016-05-04 NOTE — Patient Instructions (Addendum)
You appear to have a sinus infection. I am prescribing  azithomcyin antibiotic for the infection. To help with the nasal congestion I prescribed nasal steroid flonase. For your associated cough, I prescribed cough medicine benzonatate.  Rapid strep test was negative.  Rest, hydrate, tylenol for fever.  Follow up in 7 days or as needed.

## 2016-05-04 NOTE — Progress Notes (Signed)
Pre visit review using our clinic review tool, if applicable. No additional management support is needed unless otherwise documented below in the visit note. 

## 2016-05-12 MED FILL — VICTOZA 18 MG/3 ML INJECT P: 18 | 30 days supply | Qty: 9 | Fill #2

## 2016-06-18 DIAGNOSIS — R1084 Generalized abdominal pain: Secondary | ICD-10-CM | POA: Diagnosis not present

## 2016-06-18 DIAGNOSIS — K589 Irritable bowel syndrome without diarrhea: Secondary | ICD-10-CM | POA: Diagnosis not present

## 2016-06-18 DIAGNOSIS — K5730 Diverticulosis of large intestine without perforation or abscess without bleeding: Secondary | ICD-10-CM | POA: Diagnosis not present

## 2016-06-22 MED FILL — VICTOZA 18 MG/3 ML INJECT P: 18 | 30 days supply | Qty: 9 | Fill #3

## 2016-06-29 ENCOUNTER — Encounter: Payer: Self-pay | Admitting: Podiatry

## 2016-06-29 ENCOUNTER — Ambulatory Visit (INDEPENDENT_AMBULATORY_CARE_PROVIDER_SITE_OTHER): Payer: 59 | Admitting: Podiatry

## 2016-06-29 ENCOUNTER — Ambulatory Visit (INDEPENDENT_AMBULATORY_CARE_PROVIDER_SITE_OTHER): Payer: 59

## 2016-06-29 DIAGNOSIS — M779 Enthesopathy, unspecified: Secondary | ICD-10-CM

## 2016-06-29 DIAGNOSIS — M7662 Achilles tendinitis, left leg: Secondary | ICD-10-CM | POA: Diagnosis not present

## 2016-06-29 NOTE — Patient Instructions (Signed)

## 2016-07-01 NOTE — Progress Notes (Signed)
Subjective:     Patient ID: Alisha Stephenson, female   DOB: February 17, 1966, 51 y.o.   MRN: [id number]  HPI patient presents with significant discomfort posterior aspect left heel medial side that's making it difficult ambulation or shoe gear usage   Review of Systems     Objective:   Physical Exam Neurovascular status intact muscle strength adequate chronic discomfort posterior aspect left heel that's failed to respond to previous conservative immobilization physical therapy injection treatment    Assessment:     Chronic Achilles tendinitis left    Plan:     Discussed treatment options and this point I do think the best option we have is utilization of shockwave therapy. Reviewed shockwave therapy and patient wants this done and will be scheduled for shockwave after instructions given today  X-rays indicate spur formation with no indications of stress fracture

## 2016-07-09 ENCOUNTER — Telehealth: Payer: Self-pay | Admitting: Medical

## 2016-07-09 ENCOUNTER — Ambulatory Visit (INDEPENDENT_AMBULATORY_CARE_PROVIDER_SITE_OTHER): Payer: 59 | Admitting: Medical

## 2016-07-09 ENCOUNTER — Encounter: Payer: Self-pay | Admitting: Medical

## 2016-07-09 VITALS — BP 134/76 | HR 104 | Temp 99.1°F | Ht 63.0 in | Wt 265.2 lb

## 2016-07-09 DIAGNOSIS — J0110 Acute frontal sinusitis, unspecified: Secondary | ICD-10-CM

## 2016-07-09 DIAGNOSIS — M791 Myalgia, unspecified site: Secondary | ICD-10-CM

## 2016-07-09 DIAGNOSIS — H6690 Otitis media, unspecified, unspecified ear: Secondary | ICD-10-CM

## 2016-07-09 DIAGNOSIS — J029 Acute pharyngitis, unspecified: Secondary | ICD-10-CM | POA: Diagnosis not present

## 2016-07-09 DIAGNOSIS — E119 Type 2 diabetes mellitus without complications: Secondary | ICD-10-CM

## 2016-07-09 MED ORDER — BENZONATATE 100 MG PO CAPS: 100.0000 mg | ORAL_CAPSULE | Freq: Three times a day (TID) | ORAL | 0 refills | Status: DC | PRN

## 2016-07-09 MED ORDER — AZITHROMYCIN 250 MG PO TABS: ORAL_TABLET | ORAL | 0 refills | Status: DC

## 2016-07-09 MED ORDER — OSELTAMIVIR PHOSPHATE 75 MG PO CAPS: 75.0000 mg | ORAL_CAPSULE | Freq: Two times a day (BID) | ORAL | 0 refills | Status: DC

## 2016-07-09 MED FILL — AZITHROMYCIN 250 MG TABLET: 250 | 5 days supply | Qty: 6 | Fill #0

## 2016-07-09 MED FILL — BENZONATATE 100 MG CAP: 100 | 7 days supply | Qty: 21 | Fill #0

## 2016-07-09 NOTE — Patient Instructions (Addendum)
By exam and recent history concern for sinus infection and ear infection. Will rx azithromycin antibiotic, benzonatate for cough and start your flonase.  I doubt flu but no test available today(we ran out). But if signs and symptoms change flu like then start tamiflu. Print rx provided.  Follow up in 7 days or as needed

## 2016-07-09 NOTE — Telephone Encounter (Signed)
Future a1c placed.

## 2016-07-09 NOTE — Progress Notes (Signed)
Pre visit review using our clinic tool,if applicable. No additional management support is needed unless otherwise documented below in the visit note.  

## 2016-07-09 NOTE — Progress Notes (Signed)
Subjective:    Patient ID: Chancy Hurter, female    DOB: [birth year], 51 y.o.   MRN: [id number]  HPI   Pt in with one day of cough, stuffy nose and runny nose. Faint fatigue. No diffuse body aches. Mild sore throat and mild sinus pressure. Ear pain and pressure.     Review of Systems  Constitutional: Positive for chills and fatigue.  HENT: Positive for congestion, ear pain, sinus pain and sinus pressure. Negative for postnasal drip and sneezing.   Respiratory: Positive for cough. Negative for shortness of breath and wheezing.   Cardiovascular: Negative for chest pain and palpitations.  Gastrointestinal: Negative for abdominal pain.  Musculoskeletal: Positive for back pain.       Faint myaglias.  Skin: Negative for rash.  Neurological: Negative for dizziness, light-headedness and headaches.  Hematological: Negative for adenopathy. Does not bruise/bleed easily.  Psychiatric/Behavioral: Negative for behavioral problems and confusion. The patient is not nervous/anxious.     Past Medical History:  Diagnosis Date  . Asthma   . Diabetes mellitus without complication ([place])   . Diverticulitis   . Ovarian cyst      Social History   Social History  . Marital status: Married    Spouse name: N/A  . Number of children: N/A  . Years of education: N/A   Occupational History  . Not on file.   Social History Main Topics  . Smoking status: Former Research scientist (life sciences)  . Smokeless tobacco: Never Used  . Alcohol use No     Comment: rarely  . Drug use: No  . Sexual activity: No   Other Topics Concern  . Not on file   Social History Narrative  . No narrative on file    Past Surgical History:  Procedure Laterality Date  . ABDOMINAL HYSTERECTOMY    . CESAREAN SECTION    . CHOLECYSTECTOMY      Family History  Problem Relation Age of Onset  . Diabetes Father   . Heart disease Father     Allergies  Allergen Reactions  . Sulfa Antibiotics Rash    Current Outpatient Prescriptions  on File Prior to Visit  Medication Sig Dispense Refill  . albuterol (PROVENTIL HFA;VENTOLIN HFA) 108 (90 Base) MCG/ACT inhaler Inhale 2 puffs into the lungs every 6 (six) hours as needed for wheezing or shortness of breath. 1 Inhaler 2  . dicyclomine (BENTYL) 20 MG tablet Take by mouth. Take 1 tablet by mouth every 6 hours as needed.    . fluticasone (FLONASE) 50 MCG/ACT nasal spray Place 2 sprays into both nostrils daily. 16 g 1  . Liraglutide (VICTOZA) 18 MG/3ML SOPN 1.8 mg into skin once daily 6 pen 1  . ondansetron (ZOFRAN ODT) 8 MG disintegrating tablet Take 1 tablet (8 mg total) by mouth every 8 (eight) hours as needed for nausea or vomiting. 12 tablet 0  . tobramycin (TOBREX) 0.3 % ophthalmic solution Place 1 drop into the left eye every 6 (six) hours. 5 mL 0  . benzonatate (TESSALON) 100 MG capsule Take 1 capsule (100 mg total) by mouth 3 (three) times daily as needed for cough. 21 capsule 0   No current facility-administered medications on file prior to visit.     BP 134/76   Pulse (!) 104   Temp 99.1 F (37.3 C) (Oral)   Ht 5\' 3"  (1.6 m)   Wt 265 lb 3.2 oz (120.3 kg)   SpO2 97%   BMI 46.98 kg/m  Objective:   Physical Exam   General  Mental Status - Alert. General Appearance - Well groomed. Not in acute distress.  Skin Rashes- No Rashes.  HEENT Head- Normal. Ear Auditory Canal - Left- Normal. Right - Normal.Tympanic Membrane- Left- mild  Right- Normal. Eye Sclera/Conjunctiva- Left- Normal. Right- Normal. Nose & Sinuses Nasal Mucosa- Left-  Boggy and Congested. Right-  Boggy and  Congested.Bilateral no  Maxillary pressure  But frontal sinus pressure. Mouth & Throat Lips: Upper Lip- Normal: no dryness, cracking, pallor, cyanosis, or vesicular eruption. Lower Lip-Normal: no dryness, cracking, pallor, cyanosis or vesicular eruption. Buccal Mucosa- Bilateral- No Aphthous ulcers. Oropharynx- No Discharge or Erythema. Tonsils: Characteristics- Bilateral- mild   Erythema or Congestion. Size/Enlargement- Bilateral- No enlargement. Discharge- bilateral-None.  Neck Neck- Supple. No Masses.   Chest and Lung Exam Auscultation: Breath Sounds:-Clear even and unlabored.  Cardiovascular Auscultation:Rythm- Regular, rate and rhythm. Murmurs & Other Heart Sounds:Ausculatation of the heart reveal- No Murmurs.  Lymphatic Head & Neck General Head & Neck Lymphatics: Bilateral: Description- No Localized lymphadenopathy.      Assessment & Plan:  By exam and recent history concern for sinus infection and ear infection. Will rx azithromycin antibiotic, benzonatate for cough and start your flonase.  I doubt flu but no test available today(we ran out). But if signs and symptoms change flu like then start tamiflu. Print rx provided.  Follow up in 7 days or as needed  Tymeka Privette, Percell Miller, [employer]

## 2016-07-24 ENCOUNTER — Ambulatory Visit (INDEPENDENT_AMBULATORY_CARE_PROVIDER_SITE_OTHER): Payer: 59

## 2016-07-24 ENCOUNTER — Encounter: Payer: Self-pay | Admitting: Podiatry

## 2016-07-24 DIAGNOSIS — M722 Plantar fascial fibromatosis: Secondary | ICD-10-CM

## 2016-07-24 DIAGNOSIS — M7662 Achilles tendinitis, left leg: Secondary | ICD-10-CM

## 2016-07-24 DIAGNOSIS — M779 Enthesopathy, unspecified: Secondary | ICD-10-CM

## 2016-07-24 NOTE — Progress Notes (Signed)
Subjective:     Patient ID: Chancy Hurter, female   DOB: 15-Dec-1965, 51 y.o.   MRN: [id number]  HPI  patient presents with significant discomfort posterior aspect left heel medial side that's making it difficult ambulation or shoe gear usage.She is here for ESWT therapy   Review of Systems     All other systems negative Objective:   Physical Exam  Neurovascular status intact muscle strength adequate chronic discomfort posterior aspect left heel that's failed to respond to previous conservative immobilization physical therapy injection treatment    Assessment:     Chronic Achilles tendinitis left, pain on palpation of left achilles tendon mainly toward the medial side    Plan:     ESWT therapy administered to left posterior heel for 6 joules at 3000 pulses. EPAT delivered to surrounding tissues for 3000 pulses. She tolerated procedure well with intermittent pain 8 of 10. Re-appointed in 1 week for 2nd treatment

## 2016-07-31 ENCOUNTER — Ambulatory Visit (INDEPENDENT_AMBULATORY_CARE_PROVIDER_SITE_OTHER): Payer: 59

## 2016-07-31 DIAGNOSIS — M779 Enthesopathy, unspecified: Secondary | ICD-10-CM

## 2016-08-03 NOTE — Progress Notes (Signed)
Subjective:     Patient ID: Alisha Stephenson, female   DOB: [birth year], 51 y.o.   MRN: [id number]  HPI Pt presents stating that the pain in her left heel improved for a short period of time but has returned. States she has good days and bad days Review of Systems    All other systems negative Objective:   Physical Exam Pain on palpation of left posterior heel     Assessment:     Chronic Achilles tendinitis left, pain on palpation of left achilles tendon mainly toward the medial side    Plan:     ESWT therapy administered to left posterior heel for 8 joules at 3000 pulses. EPAT delivered to surrounding tissues for 3000 pulses. She tolerated procedure well with intermittent pain 8 of 10. Re-appointed in 1 week for 3rd treatment

## 2016-08-07 ENCOUNTER — Ambulatory Visit (INDEPENDENT_AMBULATORY_CARE_PROVIDER_SITE_OTHER): Payer: 59 | Admitting: Podiatry

## 2016-08-07 DIAGNOSIS — M7662 Achilles tendinitis, left leg: Secondary | ICD-10-CM

## 2016-08-10 ENCOUNTER — Telehealth: Payer: Self-pay | Admitting: Medical

## 2016-08-10 ENCOUNTER — Other Ambulatory Visit: Payer: Self-pay | Admitting: Medical

## 2016-08-10 MED ORDER — LIRAGLUTIDE 18 MG/3ML ~~LOC~~ SOPN: PEN_INJECTOR | SUBCUTANEOUS | 1 refills | Status: DC

## 2016-08-10 MED FILL — VICTOZA 18 MG/3 ML INJECT P: 18 | 30 days supply | Qty: 9 | Fill #0

## 2016-08-10 NOTE — Progress Notes (Signed)
Subjective:     Patient ID: Alisha Stephenson, female   DOB: 30-Jul-1965, 51 y.o.   MRN: [id number]  HPI Pt presents stating that the pain in her left heel improved for a short period of time but has returned. States she has good days and bad days Review of Systems    All other systems negative Objective:   Physical Exam Pain on palpation of left posterior heel that seems to be improving    Assessment:     Chronic Achilles tendinitis left, pain on palpation of left achilles tendon mainly toward the medial side    Plan:     ESWT therapy administered to left posterior heel for 8 joules at 3000 pulses. EPAT delivered to surrounding tissues for 3000 pulses. She tolerated procedure well with intermittent pain 8 of 10. Re-appointed in  4 weeks for 4th treatment

## 2016-08-10 NOTE — Telephone Encounter (Signed)
Refill sent per [facility] refill protocol/SLS  

## 2016-08-10 NOTE — Telephone Encounter (Signed)
°  Relation to pt: self  Call back number:[phone] Pharmacy: [place], [place] - [address] [phone] (Phone) [phone] (Fax)     Reason for call:  Patient requesting a refill Liraglutide (VICTOZA) 18 MG/3ML SOPN

## 2016-09-11 ENCOUNTER — Ambulatory Visit: Payer: 59

## 2016-09-18 MED FILL — VICTOZA 18 MG/3 ML INJECT P: 18 | 30 days supply | Qty: 9 | Fill #1

## 2016-09-25 ENCOUNTER — Ambulatory Visit: Payer: 59

## 2016-10-02 ENCOUNTER — Ambulatory Visit: Payer: 59

## 2016-10-06 ENCOUNTER — Ambulatory Visit: Payer: 59

## 2016-10-27 ENCOUNTER — Ambulatory Visit: Payer: 59

## 2016-10-30 DIAGNOSIS — R109 Unspecified abdominal pain: Secondary | ICD-10-CM | POA: Diagnosis not present

## 2016-10-30 DIAGNOSIS — R110 Nausea: Secondary | ICD-10-CM | POA: Diagnosis not present

## 2016-10-30 DIAGNOSIS — K581 Irritable bowel syndrome with constipation: Secondary | ICD-10-CM | POA: Diagnosis not present

## 2016-11-06 MED FILL — VICTOZA 18 MG/3 ML INJECT P: 18 | 30 days supply | Qty: 9 | Fill #2

## 2016-11-06 MED FILL — ONDANSETRON ODT 4 MG TABLET: 4 | 10 days supply | Qty: 30 | Fill #0

## 2016-11-07 IMAGING — DX DG HAND COMPLETE 3+V*L*
3 series · 3 of 3 positions shown · non-contrast
Comparison: None.

CLINICAL DATA: Fall.  Pain.  Initial evaluation .

EXAM:
LEFT HAND - COMPLETE 3+ VIEW

[hand pa]
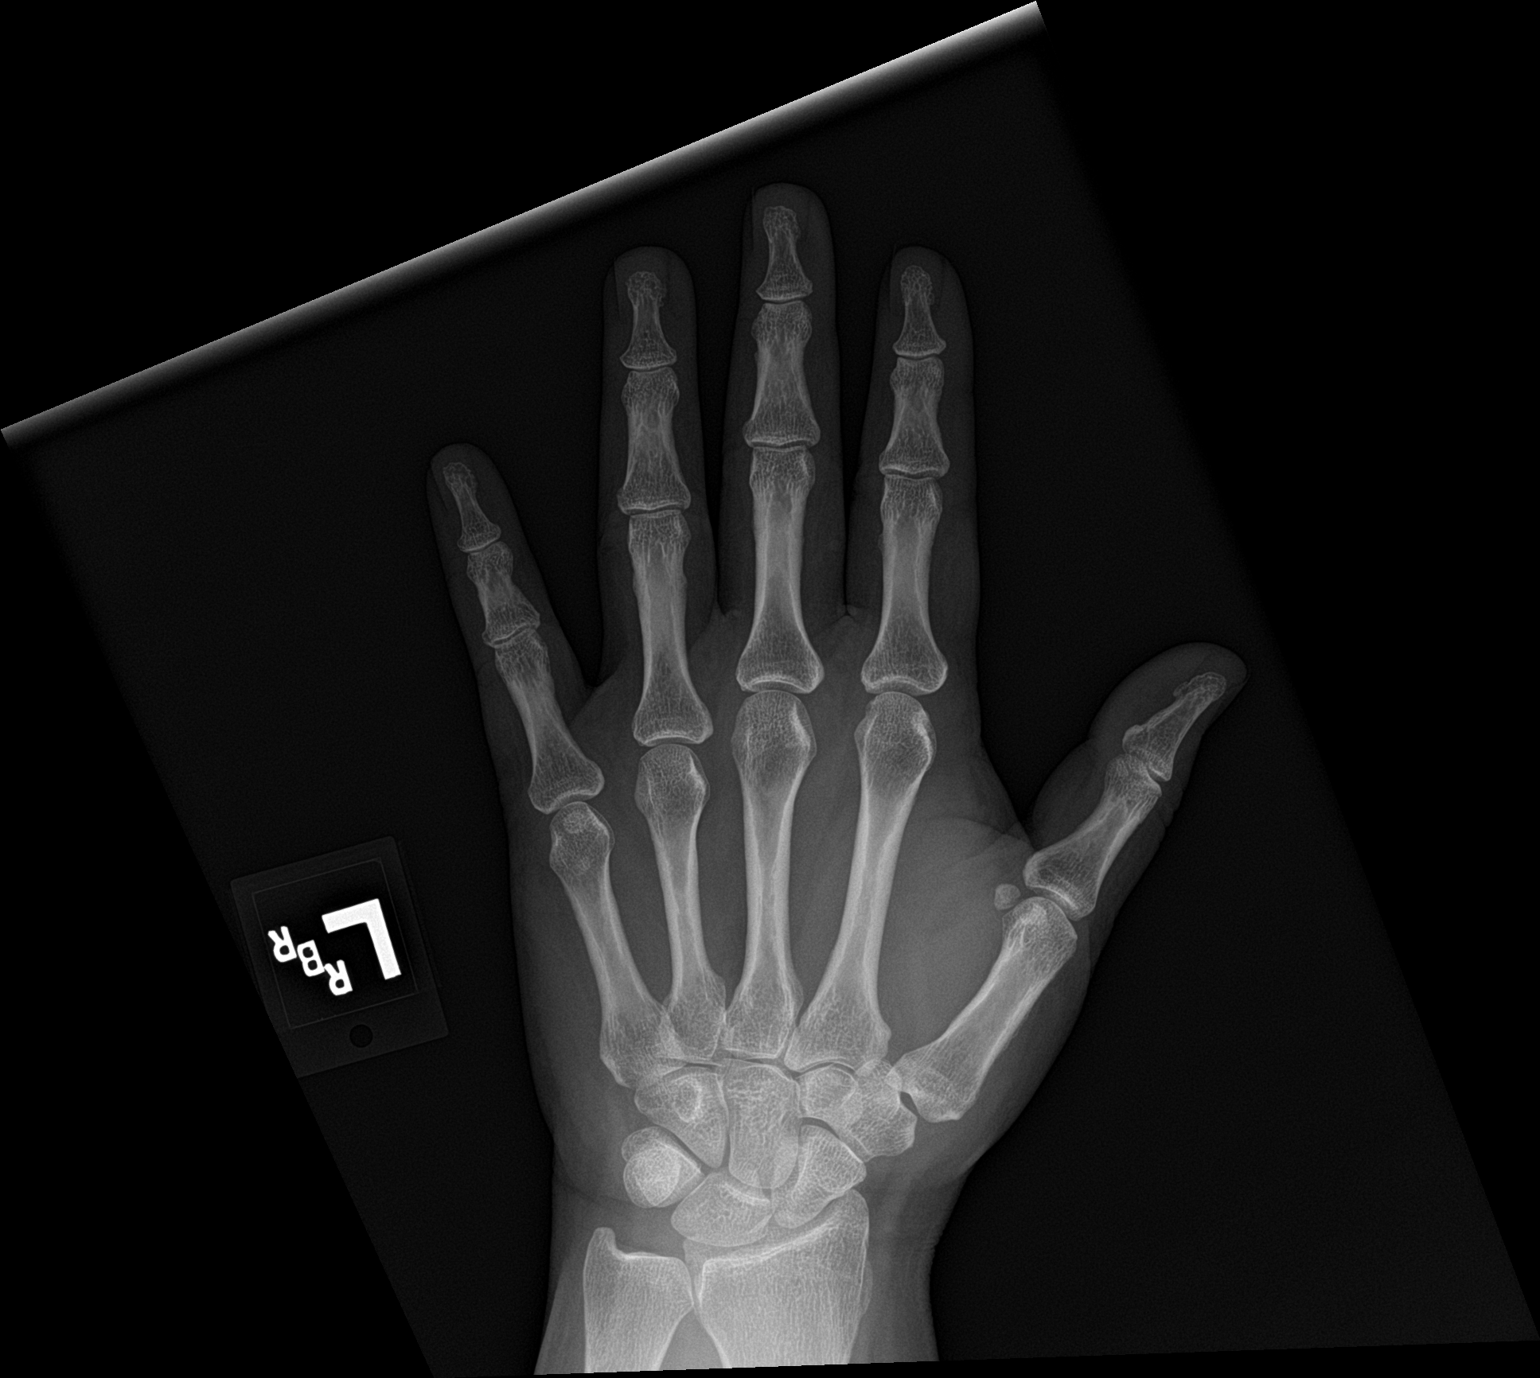

[hand obl]
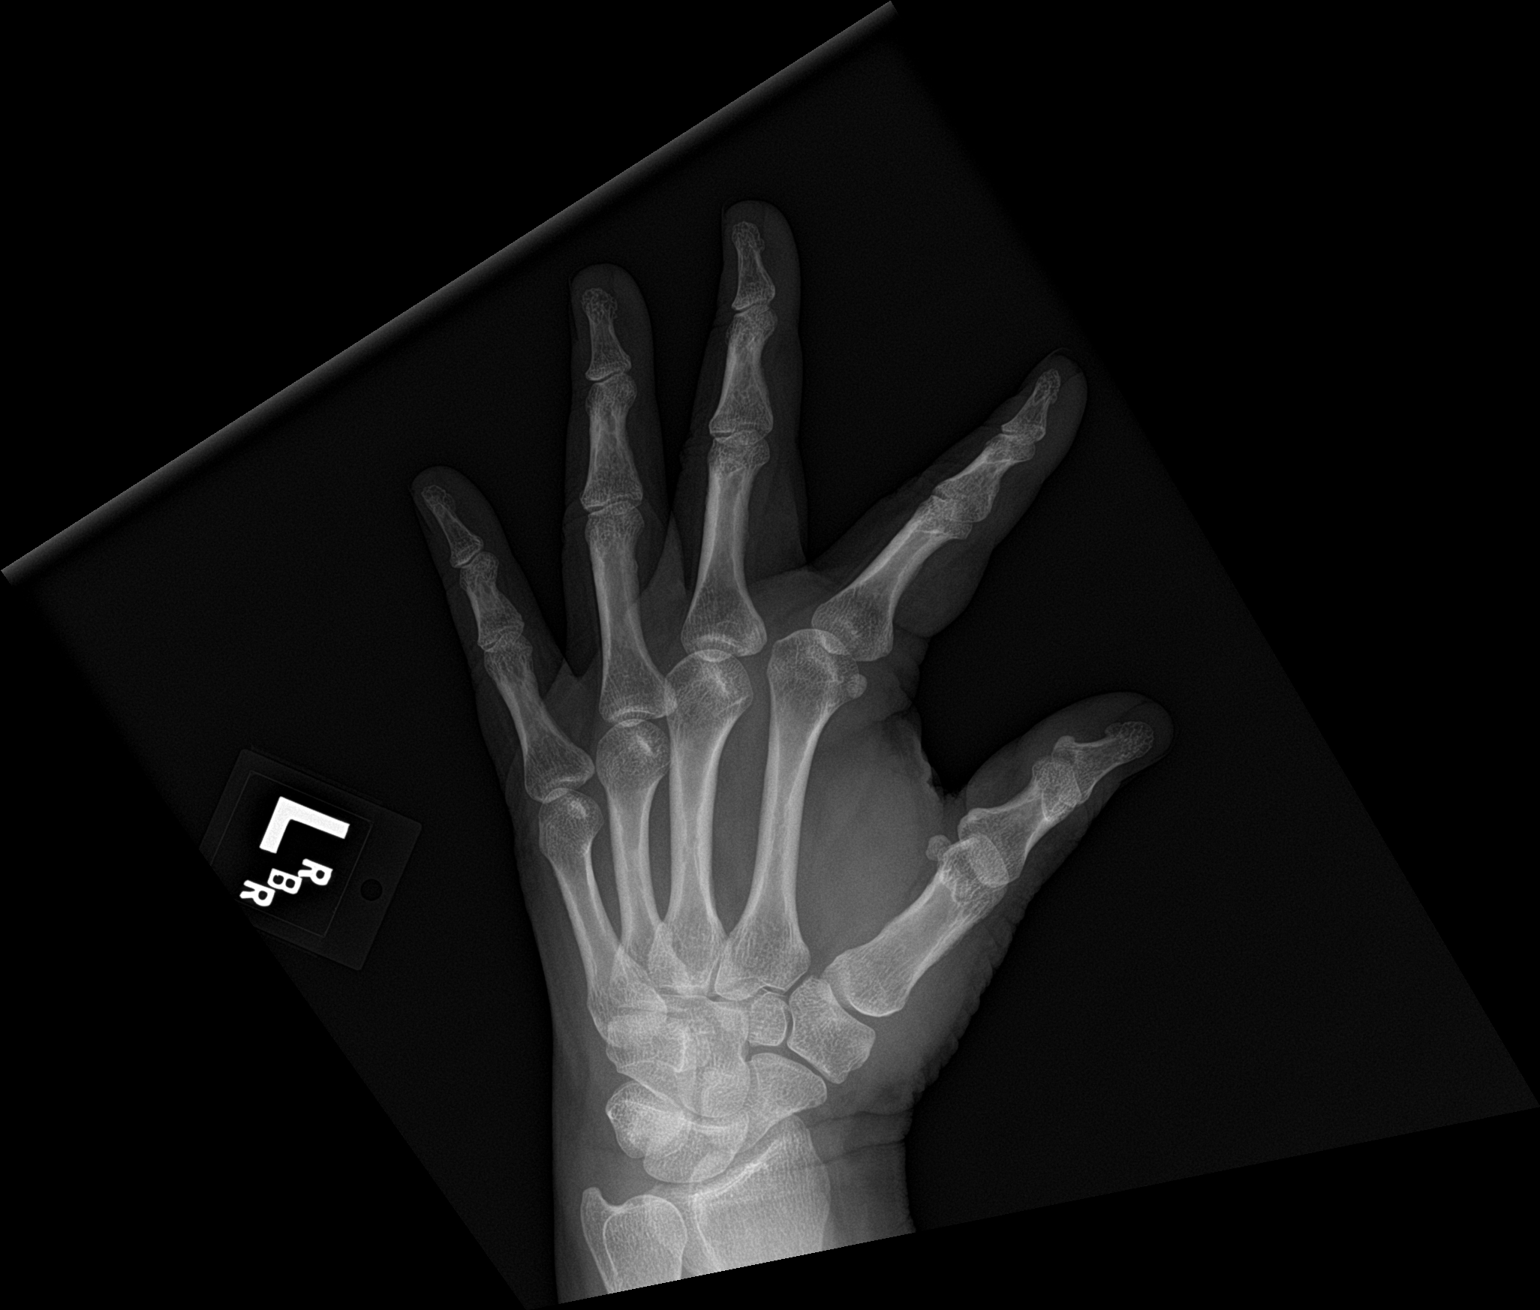

[hand lat]
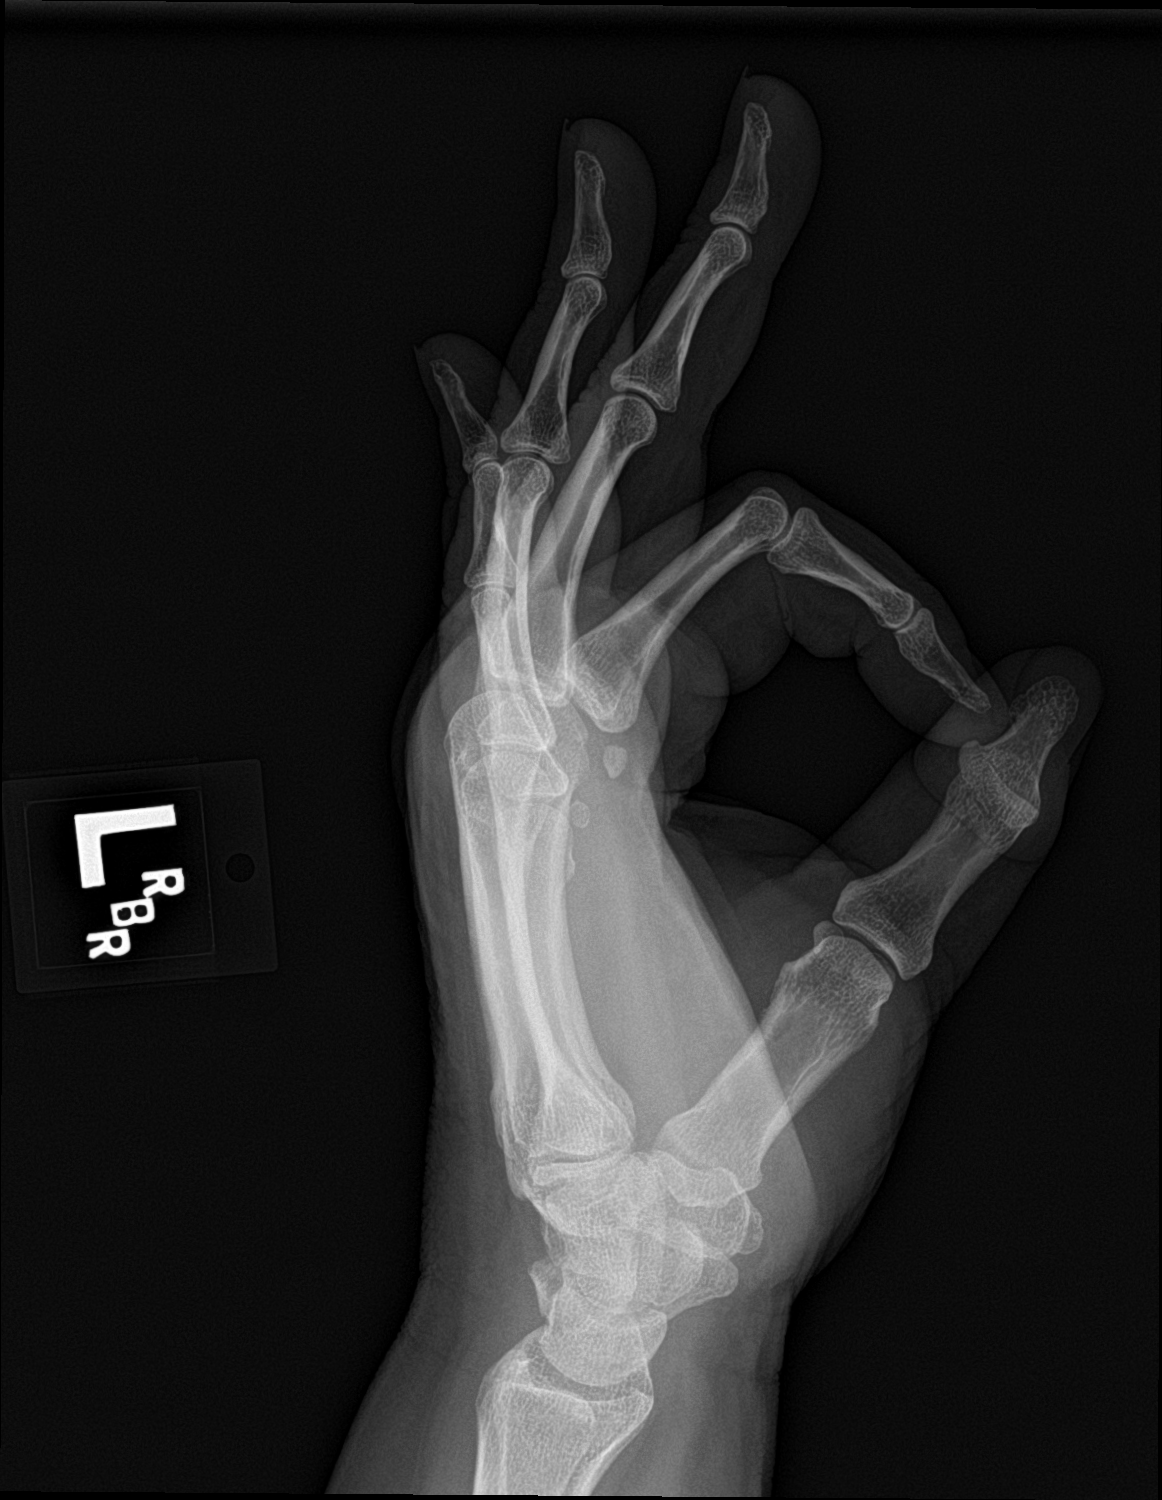

[3 of 3 positions shown; findings below may reference images not displayed]

FINDINGS: There is no evidence of fracture or dislocation. There is no
evidence of arthropathy or other focal bone abnormality. Soft
tissues are unremarkable.
IMPRESSION: No acute bony or joint abnormality identified. No evidence of
fracture or dislocation.

## 2016-11-07 IMAGING — DX DG KNEE COMPLETE 4+V*L*
4 series · 4 of 4 positions shown · non-contrast
Comparison: None.

CLINICAL DATA: Fall yesterday with knee pain, initial encounter

EXAM:
LEFT KNEE - COMPLETE 4+ VIEW

[knee ap]
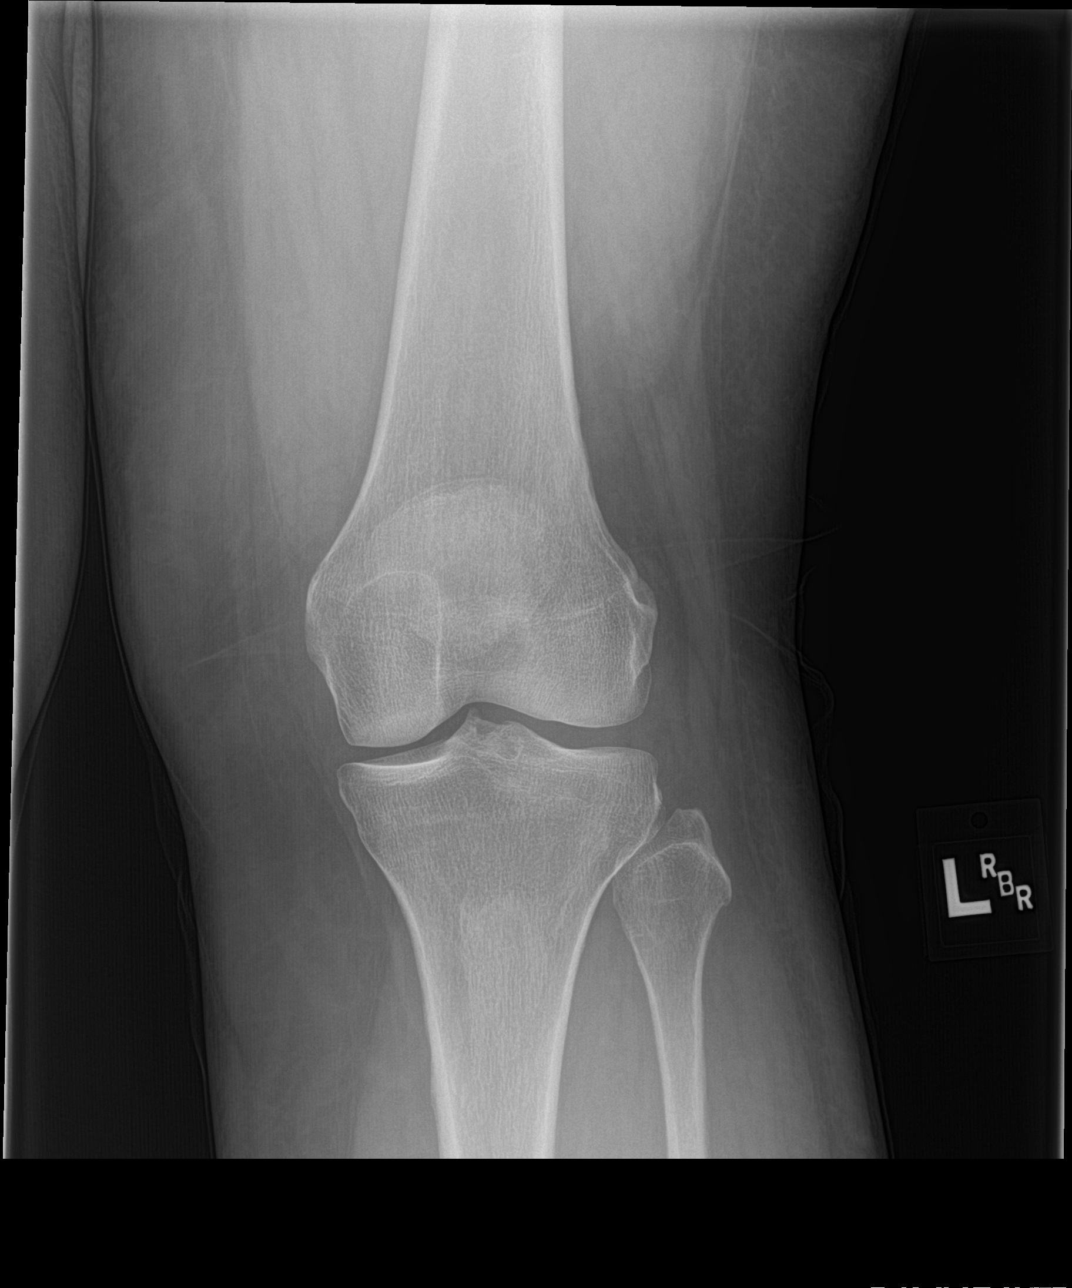

[tunnel]
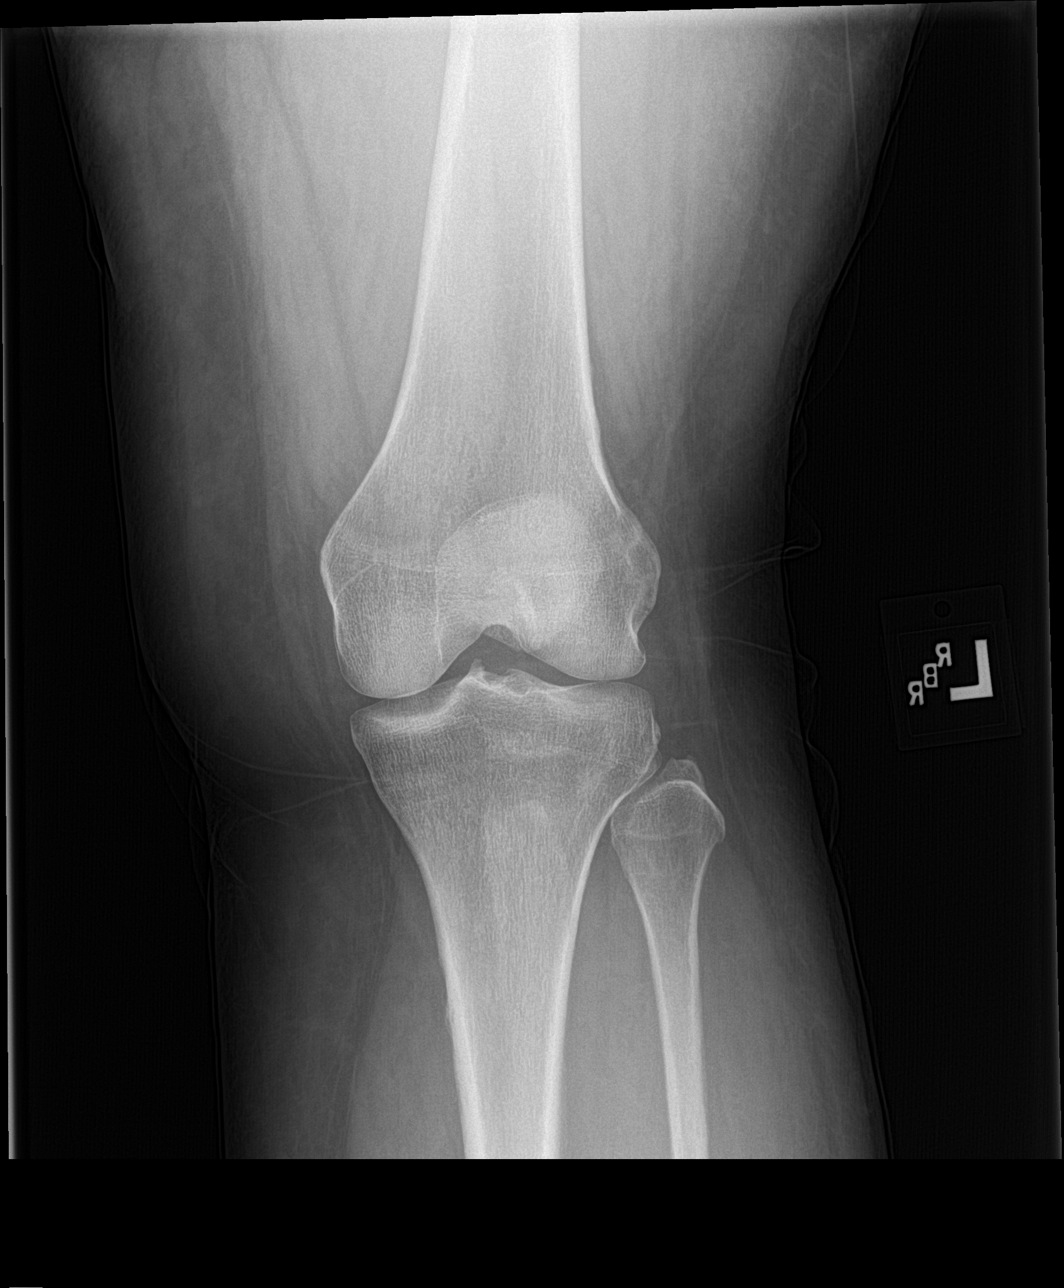

[knee lat]
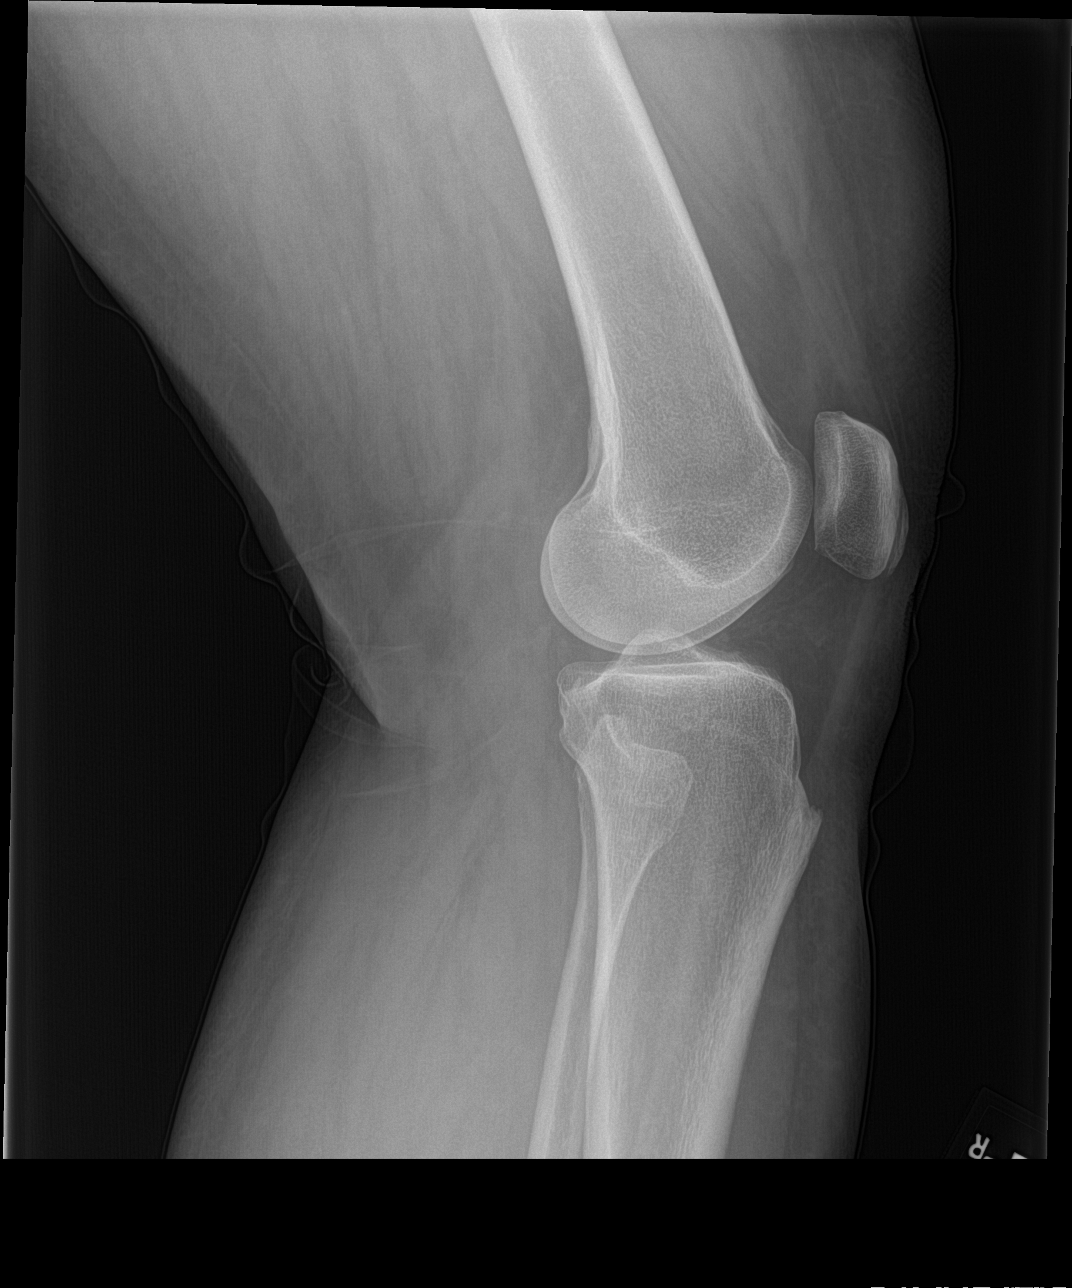

[knee sunrise]
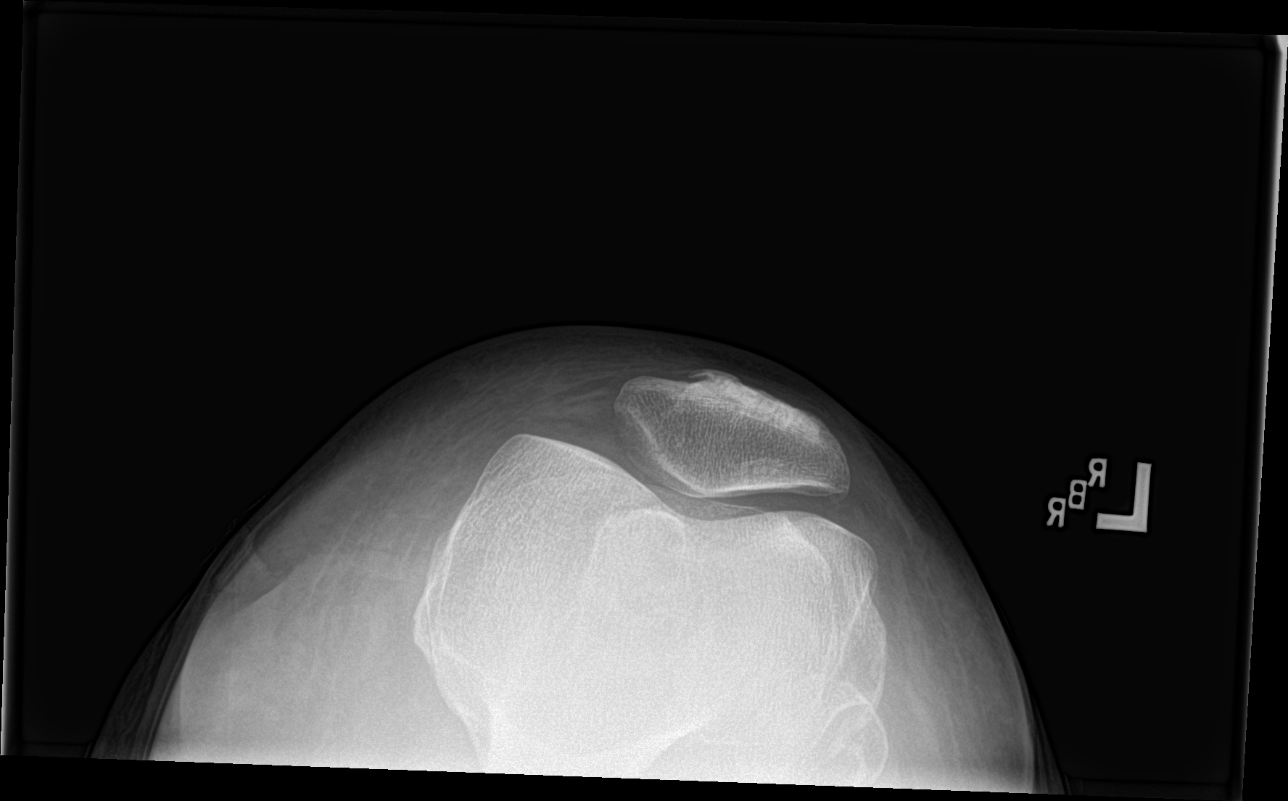

[4 of 4 positions shown; findings below may reference images not displayed]

FINDINGS: There is no evidence of fracture, dislocation, or joint effusion.
There is no evidence of arthropathy or other focal bone abnormality.
Soft tissues are unremarkable.
IMPRESSION: No acute abnormality noted.

## 2016-11-07 IMAGING — DX DG HIP (WITH OR WITHOUT PELVIS) 2-3V*R*
3 series · 3 of 3 positions shown · non-contrast
Comparison: None.

CLINICAL DATA: Fall yesterday with right hip pain, initial
encounter

EXAM:
DG HIP (WITH OR WITHOUT PELVIS) 2-3V RIGHT

[pelvis ap]
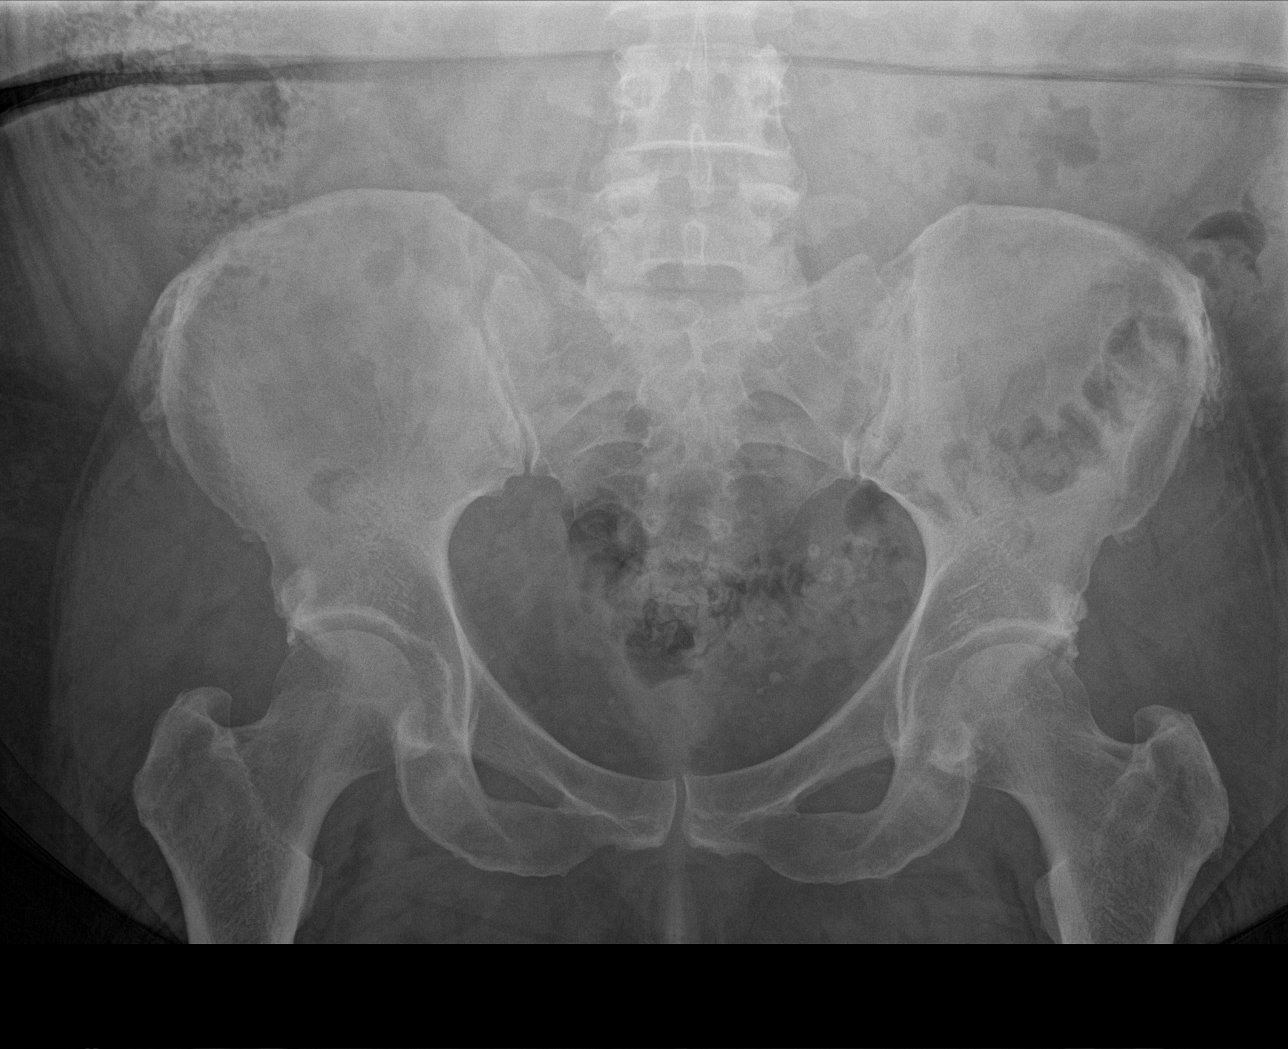

[hip ap]
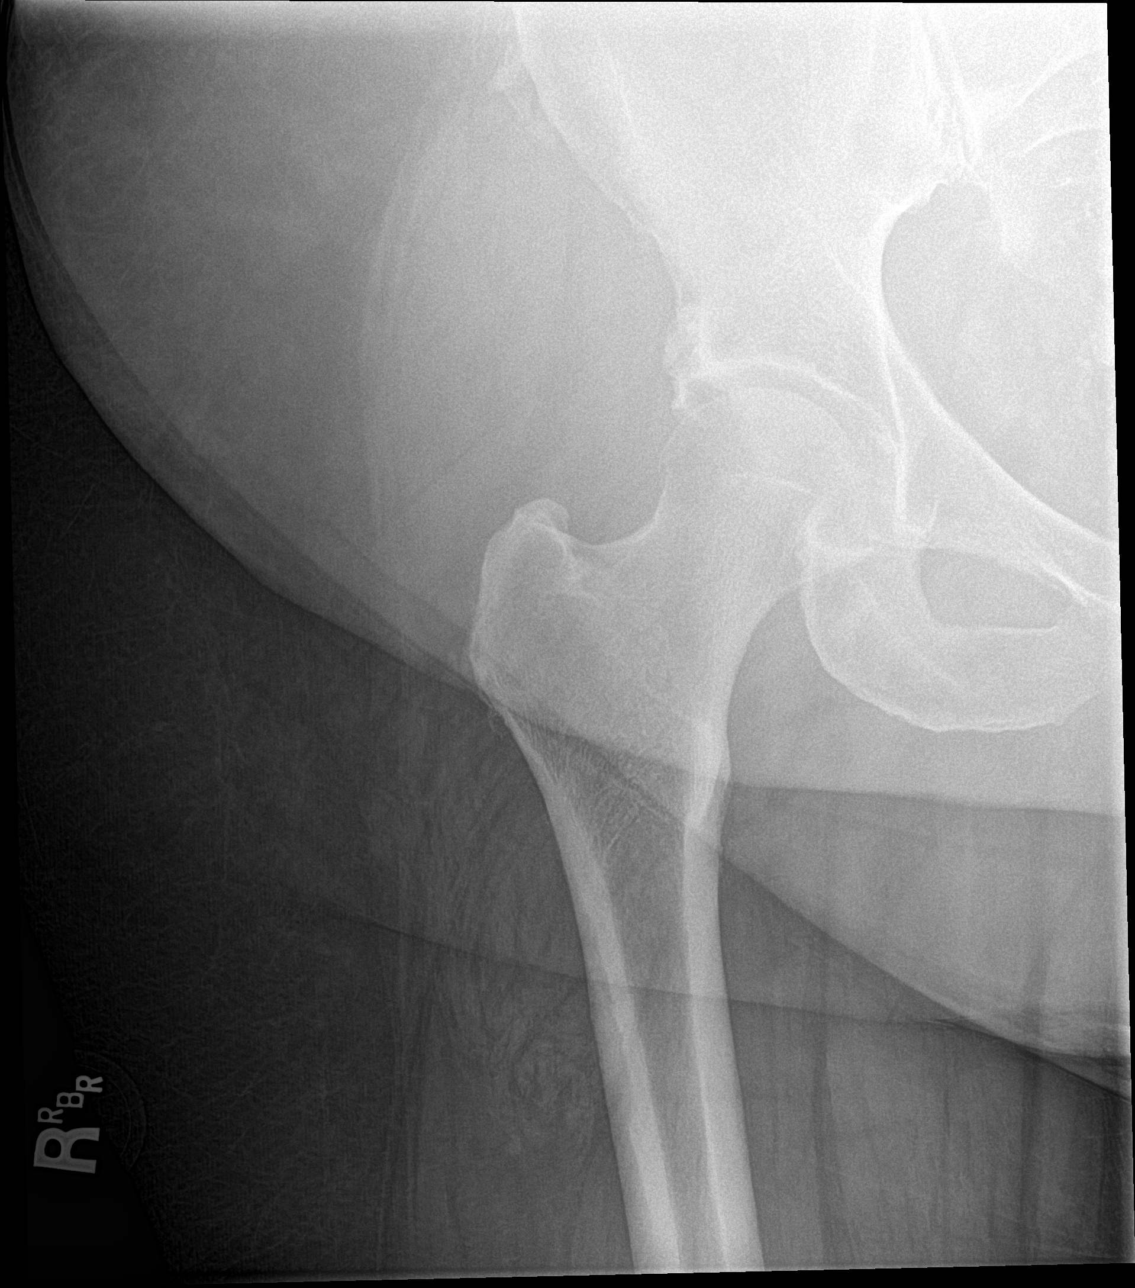

[hip lat]
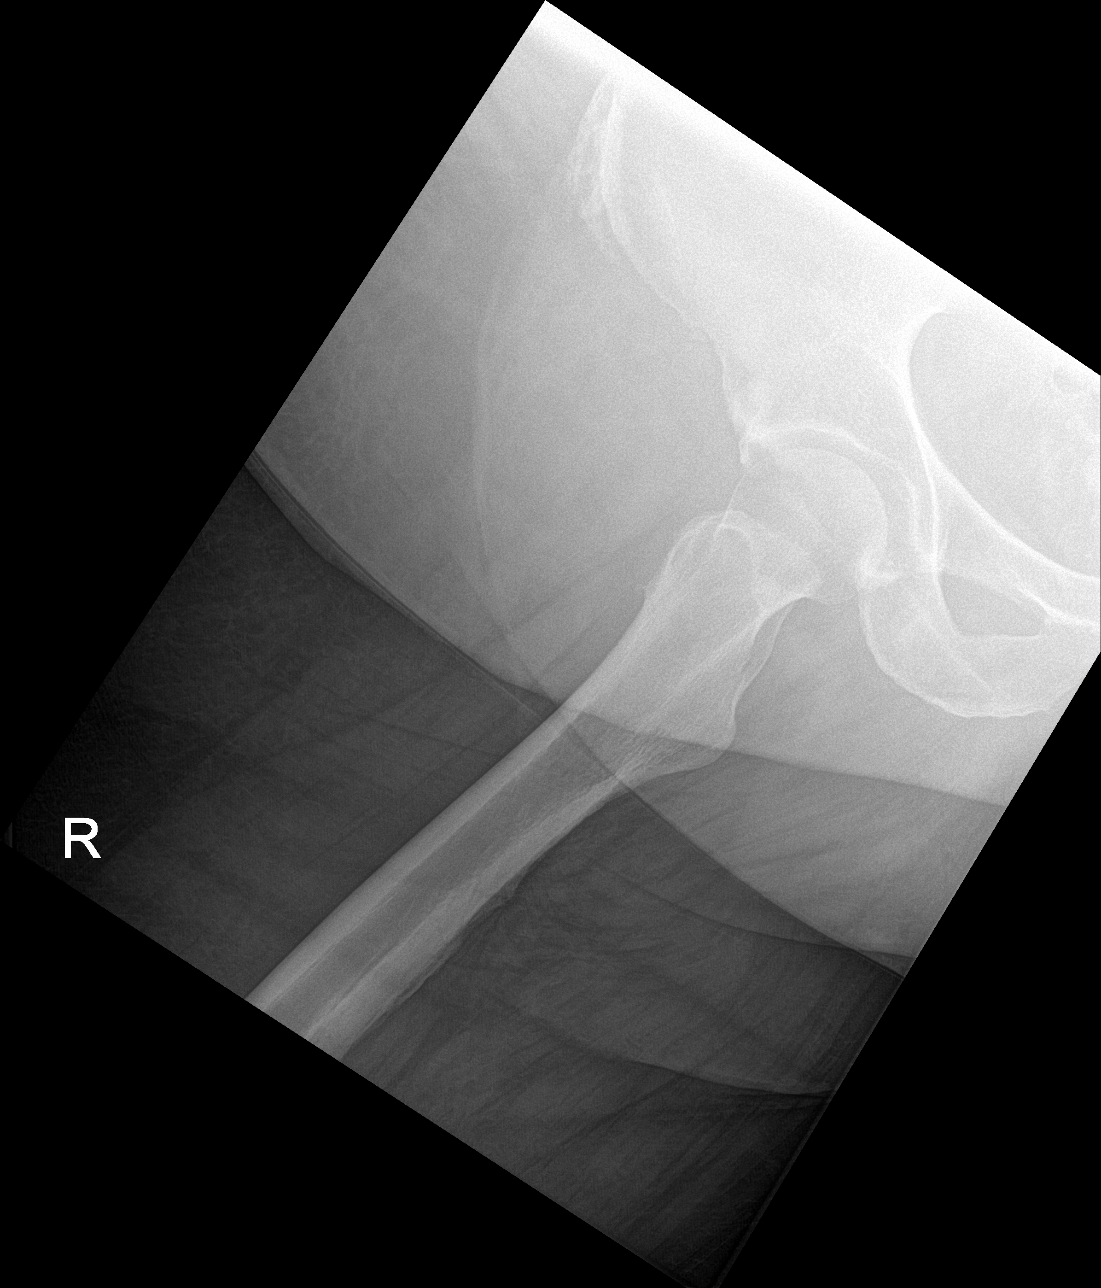

[3 of 3 positions shown; findings below may reference images not displayed]

FINDINGS: No acute fracture or dislocation is noted. Mild degenerative changes
of the hip joints are noted. No soft tissue abnormality is seen.
IMPRESSION: No acute abnormality noted.

## 2016-11-10 ENCOUNTER — Ambulatory Visit: Payer: 59

## 2016-11-17 ENCOUNTER — Ambulatory Visit (INDEPENDENT_AMBULATORY_CARE_PROVIDER_SITE_OTHER): Payer: Self-pay | Admitting: Podiatry

## 2016-11-17 DIAGNOSIS — M7662 Achilles tendinitis, left leg: Secondary | ICD-10-CM

## 2016-11-24 NOTE — Progress Notes (Signed)
Subjective:     Patient ID: Alisha Stephenson, female   DOB: 1965/10/28, 51 y.o.   MRN: [id number]  HPI Pt presents stating that the pain in her left heel has not improved much and the swelling has increased Review of Systems    All other systems negative Objective:   Physical Exam Pain on palpation of left posterior heel that seems to be improving    Assessment:     Chronic Achilles tendinitis left, pain on palpation of left achilles tendon mainly toward the medial side    Plan:     ESWT therapy administered to left posterior heel for 5 joules at 3000 pulses. EPAT delivered to surrounding tissues for 3000 pulses. She tolerated procedure well with intermittent pain 8 of 10. Re-appointed in  6 weeks for evaluation with Dr Paulla Dolly

## 2016-12-16 ENCOUNTER — Ambulatory Visit (HOSPITAL_BASED_OUTPATIENT_CLINIC_OR_DEPARTMENT_OTHER)
Admission: RE | Admit: 2016-12-16 | Discharge: 2016-12-16 | Disposition: A | Discharge status: Home / Self Care | Payer: 59 | Source: Ambulatory Visit | Attending: Medical | Admitting: Medical

## 2016-12-16 ENCOUNTER — Encounter: Payer: Self-pay | Admitting: Medical

## 2016-12-16 ENCOUNTER — Ambulatory Visit (INDEPENDENT_AMBULATORY_CARE_PROVIDER_SITE_OTHER): Payer: 59 | Admitting: Medical

## 2016-12-16 VITALS — BP 134/72 | HR 80 | Temp 98.0°F | Resp 16 | Ht 63.0 in | Wt 261.6 lb

## 2016-12-16 DIAGNOSIS — M7731 Calcaneal spur, right foot: Secondary | ICD-10-CM | POA: Diagnosis not present

## 2016-12-16 DIAGNOSIS — M25562 Pain in left knee: Secondary | ICD-10-CM | POA: Diagnosis not present

## 2016-12-16 DIAGNOSIS — M25571 Pain in right ankle and joints of right foot: Secondary | ICD-10-CM

## 2016-12-16 DIAGNOSIS — E118 Type 2 diabetes mellitus with unspecified complications: Secondary | ICD-10-CM

## 2016-12-16 DIAGNOSIS — T148XXA Other injury of unspecified body region, initial encounter: Secondary | ICD-10-CM

## 2016-12-16 DIAGNOSIS — M79662 Pain in left lower leg: Secondary | ICD-10-CM | POA: Diagnosis not present

## 2016-12-16 DIAGNOSIS — M25531 Pain in right wrist: Secondary | ICD-10-CM | POA: Diagnosis not present

## 2016-12-16 DIAGNOSIS — M79631 Pain in right forearm: Secondary | ICD-10-CM

## 2016-12-16 DIAGNOSIS — S8992XA Unspecified injury of left lower leg, initial encounter: Secondary | ICD-10-CM | POA: Diagnosis not present

## 2016-12-16 LAB — COMPREHENSIVE METABOLIC PANEL
ALBUMIN: 3.8 g/dL (ref 3.5–5.2)
ALK PHOS: 92 U/L (ref 39–117)
ALT: 33 U/L (ref 0–35)
AST: 26 U/L (ref 0–37)
BUN: 16 mg/dL (ref 6–23)
CO2: 27 mEq/L (ref 19–32)
CREATININE: 0.78 mg/dL (ref 0.40–1.20)
Calcium: 9.2 mg/dL (ref 8.4–10.5)
Chloride: 104 mEq/L (ref 96–112)
GFR: 82.42 mL/min (ref >60.00)
Glucose, Bld: 259 mg/dL — ABNORMAL HIGH (ref 70–99)
Potassium: 4.1 mEq/L (ref 3.5–5.1)
SODIUM: 137 meq/L (ref 135–145)
TOTAL PROTEIN: 6.6 g/dL (ref 6.0–8.3)
Total Bilirubin: 0.6 mg/dL (ref 0.2–1.2)

## 2016-12-16 LAB — HEMOGLOBIN A1C: HEMOGLOBIN A1C: 8.5 % — AB (ref 4.6–6.5)

## 2016-12-16 MED ORDER — MUPIROCIN 2 % EX OINT
TOPICAL_OINTMENT | CUTANEOUS | 0 refills | Status: DC
Start: 2016-12-16 — End: 2018-03-08

## 2016-12-16 MED ORDER — DICLOFENAC SODIUM 75 MG PO TBEC: 75.0000 mg | DELAYED_RELEASE_TABLET | Freq: Two times a day (BID) | ORAL | 0 refills | Status: DC

## 2016-12-16 MED ORDER — TRAMADOL HCL 50 MG PO TABS: 50.0000 mg | ORAL_TABLET | Freq: Three times a day (TID) | ORAL | 0 refills | Status: DC | PRN

## 2016-12-16 MED ORDER — MUPIROCIN 2 % EX OINT: TOPICAL_OINTMENT | CUTANEOUS | 0 refills | Status: DC

## 2016-12-16 MED FILL — MUPIROCIN 2% OINTMENT: 2 | 15 days supply | Qty: 22 | Fill #0

## 2016-12-16 MED FILL — DICLOFENAC SODIUM 75 MG TAB: 75 | 10 days supply | Qty: 20 | Fill #0

## 2016-12-16 MED FILL — traMADol HCL 50 MG TABS: 50 | 4 days supply | Qty: 12 | Fill #0

## 2016-12-16 NOTE — Patient Instructions (Addendum)
For various area of pain post fall will get xrays.   For pain will rx diclofenac nsaid. But also make tramadol.  Will follow xray orders and let you know the results.   Ace wrap given for rt ankle pending xray.  For left toe abrasion mupirocin antibiotic. Need to recheck abrasion area in one week to make sure not worsening.  For diabetes get cmp and a1c today. Stay on meds and low sugar diet.   Follow up in 7-10 days or as needed

## 2016-12-16 NOTE — Progress Notes (Signed)
Subjective:    Patient ID: Alisha Stephenson, female    DOB: 06/25/1965, 51 y.o.   MRN: [id number]  HPI  Pt in states last night she was walking in parking lot and stepped and a rock. She twisted rt ankle and fell. Pt states last night after fall had rt ankle pain, rt wrist pain, left knee pain, and abrasion left elbow.(but elbow joint has no pain. Just faint pain over abrasion).  She got small cut to her left great toe.   Pt is diabetic. She is due for a1c today. Will get cmp as well.    Review of Systems  Constitutional: Negative for chills, fatigue and fever.  Respiratory: Negative for cough, chest tightness, shortness of breath and wheezing.   Cardiovascular: Negative for chest pain and palpitations.  Gastrointestinal: Negative for abdominal pain.  Musculoskeletal: Negative for back pain.       See hpi on various area of pain.  Skin:       Left toe abrasion.  Hematological: Negative for adenopathy. Does not bruise/bleed easily.  Psychiatric/Behavioral: Negative for behavioral problems, decreased concentration, dysphoric mood and hallucinations. The patient is not nervous/anxious ( ).     Past Medical History:  Diagnosis Date  . Asthma   . Diabetes mellitus without complication ([place])   . Diverticulitis   . Ovarian cyst      Social History   Social History  . Marital status: Married    Spouse name: N/A  . Number of children: N/A  . Years of education: N/A   Occupational History  . Not on file.   Social History Main Topics  . Smoking status: Former Research scientist (life sciences)  . Smokeless tobacco: Never Used  . Alcohol use No     Comment: rarely  . Drug use: No  . Sexual activity: No   Other Topics Concern  . Not on file   Social History Narrative  . No narrative on file    Past Surgical History:  Procedure Laterality Date  . ABDOMINAL HYSTERECTOMY    . CESAREAN SECTION    . CHOLECYSTECTOMY      Family History  Problem Relation Age of Onset  . Diabetes Father   .  Heart disease Father     Allergies  Allergen Reactions  . Sulfa Antibiotics Rash    Current Outpatient Prescriptions on File Prior to Visit  Medication Sig Dispense Refill  . albuterol (PROVENTIL HFA;VENTOLIN HFA) 108 (90 Base) MCG/ACT inhaler Inhale 2 puffs into the lungs every 6 (six) hours as needed for wheezing or shortness of breath. 1 Inhaler 2  . dicyclomine (BENTYL) 20 MG tablet Take by mouth. Take 1 tablet by mouth every 6 hours as needed.    . fluticasone (FLONASE) 50 MCG/ACT nasal spray Place 2 sprays into both nostrils daily. 16 g 1  . linaclotide (LINZESS) 145 MCG CAPS capsule Take 145 mcg by mouth daily before breakfast.    . liraglutide (VICTOZA) 18 MG/3ML SOPN 1.8 mg into skin once daily 6 pen 1  . ondansetron (ZOFRAN ODT) 8 MG disintegrating tablet Take 1 tablet (8 mg total) by mouth every 8 (eight) hours as needed for nausea or vomiting. 12 tablet 0   No current facility-administered medications on file prior to visit.     BP 134/72   Pulse 80   Temp 98 F (36.7 C) (Oral)   Resp 16   Ht 5\' 3"  (1.6 m)   Wt 261 lb 9.6 oz (118.7 kg)  SpO2 97%   BMI 46.34 kg/m       Objective:   Physical Exam  General- No acute distress. Pleasant patient. Neck- Full range of motion, no jvd Lungs- Clear, even and unlabored. Heart- regular rate and rhythm. Neurologic- CNII- XII grossly intact.  Rt ankle- mild swelling and lateral aspect of ankle swollen and tender. Rt wrist- faint pain on palpation. Rt forearm- distal 1/3 ulna tender and mild swollen.  Left knee- mild swollen. Lateral tibial plateau region most tender. Good rom. Slight abrasion.  Left great toe- bottom aspect 9 mm superficial abrasion. Scabbed over.        Assessment & Plan:  For various area of pain post fall will get xrays.   For pain will rx diclofenac nsaid. But also make tramadol.  Will follow xray orders and let you know the results.   Ace wrap given for rt ankle pending xray.  For  left toe abrasion mupirocin antibiotic. Need to recheck abrasion area in one week to make sure not worsening.  For diabetes get cmp and a1c today. Stay on meds and low sugar diet.   Follow up in 7-10 days or as needed  Mylan Lengyel, Percell Miller, [employer]

## 2016-12-17 ENCOUNTER — Encounter: Payer: Self-pay | Admitting: Medical

## 2016-12-24 ENCOUNTER — Telehealth: Payer: Self-pay | Admitting: Medical

## 2016-12-24 ENCOUNTER — Encounter: Payer: Self-pay | Admitting: Medical

## 2016-12-24 ENCOUNTER — Ambulatory Visit (HOSPITAL_BASED_OUTPATIENT_CLINIC_OR_DEPARTMENT_OTHER)
Admission: RE | Admit: 2016-12-24 | Discharge: 2016-12-24 | Disposition: A | Discharge status: Home / Self Care | Payer: 59 | Source: Ambulatory Visit | Attending: Medical | Admitting: Medical

## 2016-12-24 ENCOUNTER — Ambulatory Visit (INDEPENDENT_AMBULATORY_CARE_PROVIDER_SITE_OTHER): Payer: 59 | Admitting: Medical

## 2016-12-24 VITALS — BP 123/77 | HR 78 | Temp 98.2°F | Resp 16 | Ht 63.0 in | Wt 262.0 lb

## 2016-12-24 DIAGNOSIS — M79605 Pain in left leg: Secondary | ICD-10-CM | POA: Insufficient documentation

## 2016-12-24 DIAGNOSIS — E119 Type 2 diabetes mellitus without complications: Secondary | ICD-10-CM | POA: Diagnosis not present

## 2016-12-24 DIAGNOSIS — M79662 Pain in left lower leg: Secondary | ICD-10-CM | POA: Diagnosis not present

## 2016-12-24 DIAGNOSIS — M7989 Other specified soft tissue disorders: Secondary | ICD-10-CM | POA: Diagnosis not present

## 2016-12-24 DIAGNOSIS — R609 Edema, unspecified: Secondary | ICD-10-CM | POA: Insufficient documentation

## 2016-12-24 MED ORDER — DICLOFENAC SODIUM 75 MG PO TBEC: 75.0000 mg | DELAYED_RELEASE_TABLET | Freq: Two times a day (BID) | ORAL | 0 refills | Status: DC

## 2016-12-24 MED FILL — VICTOZA 18 MG/3 ML INJECT P: 18 | 30 days supply | Qty: 9 | Fill #3

## 2016-12-24 MED FILL — DICLOFENAC SODIUM 75 MG TAB: 75 | 10 days supply | Qty: 20 | Fill #0

## 2016-12-24 NOTE — Progress Notes (Signed)
Subjective:    Patient ID: Alisha Stephenson, female    DOB: 02-03-1966, 51 y.o.   MRN: [id number]  HPI  Pt in for follow up. She states day after I saw her left lower ext pain got worse. She did take diclofenac but never took tramadol  Pt knee xray was negative on last visit.  However, the day after I saw her she  left tibia area pain and lateral of calf pain. Bruising in this area. Also bruising in her left ankle but no pain in her ankle. (Bruising in her ankle occurred over past week)  When she walks lower ext hurts and pain on light touch.  Also regarding pt diabetes she admits non compliant diet. Eating ice cream at night, some cookies and other sugary foods. Pt has been taking only 1.2 mg of victoza. I had advised 1.8 mg a day but she did not increase her dose.     Review of Systems  Constitutional: Negative for chills, fatigue and fever.  Respiratory: Negative for cough, chest tightness, shortness of breath and wheezing.   Cardiovascular: Negative for chest pain and palpitations.  Gastrointestinal: Negative for abdominal pain, constipation, diarrhea and vomiting.  Musculoskeletal:       Left pretibial pain and left calf pain.  Skin:       Bruising of left pretibial area.  Neurological: Negative for dizziness, seizures, weakness and headaches.  Hematological: Negative for adenopathy. Does not bruise/bleed easily.  Psychiatric/Behavioral: Negative for behavioral problems and confusion.    Past Medical History:  Diagnosis Date  . Asthma   . Diabetes mellitus without complication ([place])   . Diverticulitis   . Ovarian cyst      Social History   Social History  . Marital status: Married    Spouse name: N/A  . Number of children: N/A  . Years of education: N/A   Occupational History  . Not on file.   Social History Main Topics  . Smoking status: Former Research scientist (life sciences)  . Smokeless tobacco: Never Used  . Alcohol use No     Comment: rarely  . Drug use: No  . Sexual  activity: No   Other Topics Concern  . Not on file   Social History Narrative  . No narrative on file    Past Surgical History:  Procedure Laterality Date  . ABDOMINAL HYSTERECTOMY    . CESAREAN SECTION    . CHOLECYSTECTOMY      Family History  Problem Relation Age of Onset  . Diabetes Father   . Heart disease Father     Allergies  Allergen Reactions  . Sulfa Antibiotics Rash    Current Outpatient Prescriptions on File Prior to Visit  Medication Sig Dispense Refill  . albuterol (PROVENTIL HFA;VENTOLIN HFA) 108 (90 Base) MCG/ACT inhaler Inhale 2 puffs into the lungs every 6 (six) hours as needed for wheezing or shortness of breath. 1 Inhaler 2  . diclofenac (VOLTAREN) 75 MG EC tablet Take 1 tablet (75 mg total) by mouth 2 (two) times daily. 20 tablet 0  . dicyclomine (BENTYL) 20 MG tablet Take by mouth. Take 1 tablet by mouth every 6 hours as needed.    . fluticasone (FLONASE) 50 MCG/ACT nasal spray Place 2 sprays into both nostrils daily. 16 g 1  . linaclotide (LINZESS) 145 MCG CAPS capsule Take 145 mcg by mouth daily before breakfast.    . liraglutide (VICTOZA) 18 MG/3ML SOPN 1.8 mg into skin once daily 6 pen 1  .  mupirocin ointment (BACTROBAN) 2 % Apply to area twice daily 22 g 0  . ondansetron (ZOFRAN ODT) 8 MG disintegrating tablet Take 1 tablet (8 mg total) by mouth every 8 (eight) hours as needed for nausea or vomiting. 12 tablet 0  . traMADol (ULTRAM) 50 MG tablet Take 1 tablet (50 mg total) by mouth every 8 (eight) hours as needed. 12 tablet 0   No current facility-administered medications on file prior to visit.     BP 123/77   Pulse 78   Temp 98.2 F (36.8 C) (Oral)   Resp 16   Ht 5\' 3"  (1.6 m)   Wt 262 lb (118.8 kg)   SpO2 100%   BMI 46.41 kg/m       Objective:   Physical Exam   General- No acute distress. Pleasant patient. Neck- Full range of motion, no jvd Lungs- Clear, even and unlabored. Heart- regular rate and rhythm. Neurologic- CNII-  XII grossly intact.  Left knee- Good range of motion. No pain on knee.  Lt lower ext- Left tibia- upper tibia bruised and swollen. Lateral calf bruised and tender. Some mid tibia tenderness but pain more proximal(no warmth, no induration) Feels same temp on palpation as rt side.  Left ankle- mild bruise but no pain on palpation or rom. Left foot- no pain on palpation- normal pulses. Good capillary refill         Assessment & Plan:  For your left lower ext pain will get xray of tibia/fibula and lower ext [place].  Continue with diclofenac and I think tramadol reasonable option for moderate to severe pain.(rx advisement given).  If both xray negative and [place] negative then will refer to sports medicine.(note area does not look infected. Also I don't think any compartment syndrome. Did consider reflex sympapethetic dystrophy but doubt)  For diabetes please get back on low sugar diet and take victoza at the 1.8 mg dose.  Follow up 4-5 days or as needed  For diabetes follow up in 3 months for repeat a1c.  Sherman Donaldson, Percell Miller, PA-C

## 2016-12-24 NOTE — Patient Instructions (Addendum)
For your left lower ext pain will get xray of tibia/fibula and lower ext [place].  Continue with diclofenac and I think tramadol reasonable option for moderate to severe pain.(rx advisement given).  If both xray negative and [place] negative then will refer to sports medicine.  For diabetes please get back on low sugar diet and take victoza at the 1.8 mg dose.  Follow up 4-5 days or as needed  For diabetes follow up in 3 months for repeat a1c.

## 2016-12-24 NOTE — Telephone Encounter (Signed)
Referral to sports med placed. Can she be seen early next week.

## 2016-12-25 NOTE — Telephone Encounter (Signed)
Pt is scheduled for 7/23 3:00pm with Dr. Barbaraann Barthel

## 2016-12-28 ENCOUNTER — Encounter: Payer: Self-pay | Admitting: Family Medicine

## 2016-12-28 ENCOUNTER — Ambulatory Visit (INDEPENDENT_AMBULATORY_CARE_PROVIDER_SITE_OTHER): Payer: 59 | Admitting: Family Medicine

## 2016-12-28 DIAGNOSIS — S8992XA Unspecified injury of left lower leg, initial encounter: Secondary | ICD-10-CM

## 2016-12-28 NOTE — Patient Instructions (Addendum)
Your exam and ultrasound are reassuring. You have a large hematoma around your lower leg but there's no evidence of a muscle tear or subtle fracture of your lower leg bones. This appears clotted off at this point which is good but there's not an area we could drain to potentially make your recovery faster. Elevate above your heart as much as possible. ACE wrap for compression from just above toes to above the knee. Ice or heat (whichever feels better at this point) 15 minutes at a time up to every hour if needed. Continue the diclofenac twice a day with food for pain and inflammation. Follow up with me in 4 weeks for reevaluation. I would expect the pain to be much better by this time though the swelling can take months to completely resolve.

## 2016-12-30 ENCOUNTER — Ambulatory Visit: Payer: 59 | Admitting: Podiatry

## 2016-12-30 DIAGNOSIS — S8992XA Unspecified injury of left lower leg, initial encounter: Secondary | ICD-10-CM | POA: Insufficient documentation

## 2016-12-30 NOTE — Assessment & Plan Note (Signed)
independently reviewed radiographs and no evidence fracture, bony abnormalities.  Ultrasound and doppler reassuring.  Consistent with bruising, hematoma.  Elevation, ACE for compression, ice/heat.  Continue diclofenac.  F/u in 4 weeks.

## 2016-12-30 NOTE — Progress Notes (Signed)
PCP and consultation requested by: Mackie Pai, PA-C  Subjective:   HPI: Patient is a 51 y.o. female here for left leg injury.  Patient reports on 7/10 she was walking in a parking lot when she stepped on a rock and inverted her right ankle, fell forward onto the ground. Reports right side feels improved but left leg from about the knee down all the way around painful. Swelling, bruising. Pain level 5/10 but up to 8/10 and sharp by end of day. Taken diclofenac, tramadol. Doppler u/s and radiographs of tib/fib and knee were negative. Has been icing and elevating. No other skin changes. No numbness.  Past Medical History:  Diagnosis Date  . Asthma   . Diabetes mellitus without complication ([place])   . Diverticulitis   . Ovarian cyst     Current Outpatient Prescriptions on File Prior to Visit  Medication Sig Dispense Refill  . albuterol (PROVENTIL HFA;VENTOLIN HFA) 108 (90 Base) MCG/ACT inhaler Inhale 2 puffs into the lungs every 6 (six) hours as needed for wheezing or shortness of breath. 1 Inhaler 2  . diclofenac (VOLTAREN) 75 MG EC tablet Take 1 tablet (75 mg total) by mouth 2 (two) times daily. 20 tablet 0  . dicyclomine (BENTYL) 20 MG tablet Take by mouth. Take 1 tablet by mouth every 6 hours as needed.    . fluticasone (FLONASE) 50 MCG/ACT nasal spray Place 2 sprays into both nostrils daily. 16 g 1  . linaclotide (LINZESS) 145 MCG CAPS capsule Take 145 mcg by mouth daily before breakfast.    . liraglutide (VICTOZA) 18 MG/3ML SOPN 1.8 mg into skin once daily 6 pen 1  . mupirocin ointment (BACTROBAN) 2 % Apply to area twice daily 22 g 0  . ondansetron (ZOFRAN ODT) 8 MG disintegrating tablet Take 1 tablet (8 mg total) by mouth every 8 (eight) hours as needed for nausea or vomiting. 12 tablet 0  . traMADol (ULTRAM) 50 MG tablet Take 1 tablet (50 mg total) by mouth every 8 (eight) hours as needed. 12 tablet 0   No current facility-administered medications on file prior to visit.      Past Surgical History:  Procedure Laterality Date  . ABDOMINAL HYSTERECTOMY    . CESAREAN SECTION    . CHOLECYSTECTOMY      Allergies  Allergen Reactions  . Sulfa Antibiotics Rash    Social History   Social History  . Marital status: Married    Spouse name: N/A  . Number of children: N/A  . Years of education: N/A   Occupational History  . Not on file.   Social History Main Topics  . Smoking status: Former Research scientist (life sciences)  . Smokeless tobacco: Never Used  . Alcohol use No     Comment: rarely  . Drug use: No  . Sexual activity: No   Other Topics Concern  . Not on file   Social History Narrative  . No narrative on file    Family History  Problem Relation Age of Onset  . Diabetes Father   . Heart disease Father     BP 121/73   Pulse 82   Ht 5\' 3"  (1.6 m)   Wt 260 lb (117.9 kg)   BMI 46.06 kg/m   Review of Systems: See HPI above.     Objective:  Physical Exam:  Gen: NAD, comfortable in exam room  Left knee/leg: Mod swelling, bruising noted circumferentially lower leg.  No other deformity.  No effusion. TTP over entire lower leg below  knee. FROM knee without pain. Negative ant/post drawers. Negative valgus/varus testing. Negative lachmanns. Negative mcmurrays, apleys, patellar apprehension. NV intact distally.  Right knee: FROM without pain.  MSK u/s left leg: No cortical irregularities of tibia, fibula, no edema overlying cortices.  Hematoma underlying bruised areas of lower leg but no anechoic areas consistent with non-coagulated blood.     Assessment & Plan:  1. Left leg injury - independently reviewed radiographs and no evidence fracture, bony abnormalities.  Ultrasound and doppler reassuring.  Consistent with bruising, hematoma.  Elevation, ACE for compression, ice/heat.  Continue diclofenac.  F/u in 4 weeks.

## 2017-01-15 ENCOUNTER — Ambulatory Visit: Payer: 59 | Admitting: Podiatry

## 2017-01-15 ENCOUNTER — Encounter: Payer: Self-pay | Admitting: Podiatry

## 2017-01-15 ENCOUNTER — Ambulatory Visit (INDEPENDENT_AMBULATORY_CARE_PROVIDER_SITE_OTHER): Payer: 59 | Admitting: Podiatry

## 2017-01-15 ENCOUNTER — Ambulatory Visit (INDEPENDENT_AMBULATORY_CARE_PROVIDER_SITE_OTHER): Payer: 59

## 2017-01-15 DIAGNOSIS — R7309 Other abnormal glucose: Secondary | ICD-10-CM

## 2017-01-15 DIAGNOSIS — M7662 Achilles tendinitis, left leg: Secondary | ICD-10-CM | POA: Diagnosis not present

## 2017-01-15 DIAGNOSIS — M7732 Calcaneal spur, left foot: Secondary | ICD-10-CM | POA: Diagnosis not present

## 2017-01-18 ENCOUNTER — Telehealth: Payer: Self-pay | Admitting: *Deleted

## 2017-01-18 ENCOUNTER — Other Ambulatory Visit: Payer: Self-pay | Admitting: Medical

## 2017-01-18 NOTE — Telephone Encounter (Signed)
"  The patient said she's having a surgery by Dr. Jacqualyn Posey.  She said she needs some durable medical equipment and wants to know if her insurance will cover it.  She couldn't tell me the name of the equipment she needs.  Can you tell me what she needs?"  She probably needs a knee scooter.  "Do you know the cpt code for that?"  I do not know the code for the knee scooter.  "Okay, that is fine.  I will look it up."  Do you know when she's scheduled for surgery?  "I do not know the date.  I can call her and find out if you like."  I called the patient to see if she scheduled surgery.  She stated she has not scheduled a date.  She stated she was just checking her insurance to see if they would cover the scooter.  I asked if she signed consent forms.  She stated she did.

## 2017-01-19 ENCOUNTER — Telehealth: Payer: Self-pay | Admitting: Podiatry

## 2017-01-19 ENCOUNTER — Telehealth: Payer: Self-pay

## 2017-01-19 DIAGNOSIS — R7309 Other abnormal glucose: Secondary | ICD-10-CM

## 2017-01-19 LAB — HEMOGLOBIN A1C
Hgb A1c MFr Bld: 8.2 % — ABNORMAL HIGH (ref <5.7)
MEAN PLASMA GLUCOSE: 189 mg/dL

## 2017-01-19 MED FILL — VICTOZA 18 MG/3 ML INJECT P: 18 | 60 days supply | Qty: 18 | Fill #0

## 2017-01-19 NOTE — Telephone Encounter (Signed)
Spoke with patient advising her  Of A1C results and that Dr Jacqualyn Posey wanted to hold off on surgery until they decreased. I told her that we needed to recheck her A1C in about 6 weeks. Orders are put in for 6 weeks from now and she is to come pick them up before going to the lab.

## 2017-01-19 NOTE — Telephone Encounter (Signed)
-----   Message from Trula Slade, DPM sent at 01/19/2017  9:36 AM EDT ----- A1c is down to 8.2 but I would recommend holding off on surgery until it comes down more to help prevent complications. Would recheck in 6 weeks. Also, have her keep a diary of her blood sugars.

## 2017-01-19 NOTE — Telephone Encounter (Signed)
I was a pt of Dr. Mellody Drown but am now seeing Dr. Jacqualyn Posey and he is going to do my surgery. He told me he wanted my A1C below 8.0 to do the surgery. I went for my blood work yesterday and the results are on [place] today and it shows it is down to 8.2. I didn't know what he wants to do from here.

## 2017-01-20 DIAGNOSIS — M7662 Achilles tendinitis, left leg: Secondary | ICD-10-CM | POA: Insufficient documentation

## 2017-01-20 NOTE — Progress Notes (Signed)
Subjective: Ms. Ferrara presents the office today for continued pain to the left posterior heel. She is attended numerous conservative treatments including shoe changes, offloading, padding, EPAT, injection without a significant improvement. She presents a for follow with Dr. Paulla Dolly after discussion I did see her for surgical consultation and evaluation. At this point given her prolonged nature of symptoms as well as despite conservative treatment she continues to have pain she is interested in surgical intervention. She is diabetic and her last A1c was 8.5. She states that previously was down to about 7 however she started eating ice cream at nighttime and her diet has been off which she has been working on. Denies any systemic complaints such as fevers, chills, nausea, vomiting. No acute changes since last appointment, and no other complaints at this time.   Objective: AAO x3, NAD DP/PT pulses palpable bilaterally, CRT less than 3 seconds There is tenderness palpation of the posterior aspect the left heel along the insertion of the Achilles tendon into the calcaneus there is a prominent retrocalcaneal exostosis palpable. There is no pain with lateral compression of the calcaneus. There is trace edema to the area and there is no erythema or increase in warmth. Achilles tendon intact and Thompson test is negative. Is no other areas of tenderness. Equinus is present. No open lesions or pre-ulcerative lesions.  No pain with calf compression, swelling, warmth, erythema  Assessment: Retrocalcaneal exostosis, equinus  Plan: -All treatment options discussed with the patient including all alternatives, risks, complications.  -At this point I reviewed the chart and the patient's x-rays with her. I discussed with her surgical intervention and conservative treatment as well. At this point she wishes to pursue a surgical intervention. -Discussed with her gastrocnemius recession, Achilles tendon lysis and heel  spur resection. Discussed with the surgery as well as postoperative course. Discussed this is not a guarantee resolution symptoms and she can have worsening symptoms postop which she understands. -The incision placement as well as the postoperative course was discussed with the patient. I discussed risks of the surgery which include, but not limited to, infection, bleeding, pain, swelling, need for further surgery, delayed or nonhealing, painful or ugly scar, numbness or sensation changes, over/under correction, recurrence, transfer lesions, tendon rupturefurther deformity, hardware failure, DVT/PE, loss of toe/foot. Patient understands these risks and wishes to proceed with surgery. The surgical consent was reviewed with the patient all 3 pages were signed. No promises or guarantees were given to the outcome of the procedure. All questions were answered to the best of my ability. Before the surgery the patient was encouraged to call the office if there is any further questions. The surgery will be performed at the [facility] on an outpatient basis. -Given elevated A1c will recheck prior to surgery. Needs to be below 8. -Patient encouraged to call the office with any questions, concerns, change in symptoms.   Celesta Gentile, DPM

## 2017-01-25 ENCOUNTER — Ambulatory Visit: Payer: 59 | Admitting: Family Medicine

## 2017-01-25 ENCOUNTER — Telehealth: Payer: Self-pay | Admitting: *Deleted

## 2017-01-25 ENCOUNTER — Telehealth: Payer: Self-pay | Admitting: Podiatry

## 2017-01-25 NOTE — Telephone Encounter (Signed)
She should come in to discuss all of this in person again prior to surgery.

## 2017-01-25 NOTE — Telephone Encounter (Signed)
"  I have some questions I'd like to ask about my surgery.  Dr. Paulla Dolly referred me to Dr. Jacqualyn Posey.  How long will I be wearing the cast?  How soon will I be able to go back to work?  Dr. Paulla Dolly said I'd need to be out at least 2-3 weeks, then I could go back as long as I could elevate.  Will it be okay to elevate my foot in a chair that's the same height?  There's no way I can elevate it above my heart.  I'm a big girl, my belly would get in the way.  How long will I have to wear the cast?  How many days will I have to take the blood thinner shots?  He said I was going to be put on an antibiotic after surgery.  I have a problem taking pills can he prescribe the liquid flagyl?  He mentioned that I would have to have physical therapy.  When will I do that?"  You could be out of work for about 8-12 weeks.  If you can go back to work and sit and elevate you can do as Dr. Paulla Dolly suggested.  It will be okay for you to elevate in a chair of equal height.  You will probably be in the hard cast for a couple of weeks then he may put you in an air fracture walker for a few weeks.  I will let him know about your difficulty swallowing big pills.  He will determine if you need physical therapy at a later date after making his observations.  You will have to use the shots for about 7-10 days.  You will probably be using a knee scooter to get around.  "Yes I already have one of those that I borrowed from my co-worker.

## 2017-01-25 NOTE — Telephone Encounter (Signed)
I was calling because I have some questions about my upcoming surgery.

## 2017-01-26 NOTE — Telephone Encounter (Signed)
I am calling to let you know that Dr. Jacqualyn Posey wants to see you again prior to having your surgery.  "Okay, that's fine."  So whenever you are cleared to have surgery, you can call and schedule an appointment to see him.  "I have another question.  I know he said I would be out of work for about 2-4 weeks.  I've asked my boss if I could work from home.  She said she could set me up for it but I just need to know for how long I'll have to do this or should I wait to ask him at my appointment?"  You should wait and ask him at your appointment.

## 2017-02-12 ENCOUNTER — Encounter: Payer: Self-pay | Admitting: Medical

## 2017-02-12 ENCOUNTER — Ambulatory Visit (INDEPENDENT_AMBULATORY_CARE_PROVIDER_SITE_OTHER): Payer: 59 | Admitting: Medical

## 2017-02-12 VITALS — BP 123/75 | HR 91 | Temp 98.1°F | Resp 16 | Ht 63.0 in | Wt 257.8 lb

## 2017-02-12 DIAGNOSIS — J301 Allergic rhinitis due to pollen: Secondary | ICD-10-CM | POA: Diagnosis not present

## 2017-02-12 DIAGNOSIS — J0100 Acute maxillary sinusitis, unspecified: Secondary | ICD-10-CM | POA: Diagnosis not present

## 2017-02-12 MED ORDER — DOXYCYCLINE HYCLATE 100 MG PO TABS: 100.0000 mg | ORAL_TABLET | Freq: Two times a day (BID) | ORAL | 0 refills | Status: DC

## 2017-02-12 MED ORDER — BENZONATATE 100 MG PO CAPS: 100.0000 mg | ORAL_CAPSULE | Freq: Three times a day (TID) | ORAL | 0 refills | Status: DC | PRN

## 2017-02-12 MED ORDER — FLUTICASONE PROPIONATE 50 MCG/ACT NA SUSP: 2.0000 | Freq: Every day | NASAL | 2 refills | Status: DC

## 2017-02-12 MED FILL — FLUTICASONE PROP 50 MCG SPR: 50 | 30 days supply | Qty: 16 | Fill #0

## 2017-02-12 MED FILL — DOXYCYCLINE HYCLATE 100 MG: 100 | 10 days supply | Qty: 20 | Fill #0

## 2017-02-12 MED FILL — BENZONATATE 100 MG CAP: 100 | 7 days supply | Qty: 21 | Fill #0

## 2017-02-12 NOTE — Patient Instructions (Addendum)
You appear to have a sinus infection(with likely allergic component). I am prescribing  doxycycline antibiotic for the infection. To help with the nasal congestion I prescribed flonase nasal steroid. For your associated cough, I prescribed cough medicine benzonatate  After you finish with antibiotics can use claritin if needed as we approach fall..  Rest, hydrate, tylenol for fever.  Follow up in 7 days or as needed.

## 2017-02-12 NOTE — Progress Notes (Signed)
Subjective:    Patient ID: Alisha Stephenson, female    DOB: May 01, 1966, 51 y.o.   MRN: [id number]  HPI  Nasal congestion and sinus pressure for about one week. Hx of allergies as in spring and fall.  Increasing sinus pain last 2 days. Hx of sinus infections twice in past year.   Some sneezing as well recently at onset of nasal congestion. Rt side sinus hurts most and has some teeth pain.   Review of Systems  Constitutional: Positive for chills. Negative for fatigue and fever.  HENT: Positive for congestion, sinus pressure and sneezing. Negative for tinnitus.   Respiratory: Positive for cough. Negative for choking, chest tightness, shortness of breath and wheezing.        Rare  Cardiovascular: Negative for chest pain and palpitations.  Gastrointestinal: Negative for abdominal pain, constipation, nausea and vomiting.  Musculoskeletal: Negative for back pain.  Hematological: Negative for adenopathy. Does not bruise/bleed easily.    Past Medical History:  Diagnosis Date  . Asthma   . Diabetes mellitus without complication ([place])   . Diverticulitis   . Ovarian cyst      Social History   Social History  . Marital status: Married    Spouse name: N/A  . Number of children: N/A  . Years of education: N/A   Occupational History  . Not on file.   Social History Main Topics  . Smoking status: Former Research scientist (life sciences)  . Smokeless tobacco: Never Used  . Alcohol use No     Comment: rarely  . Drug use: No  . Sexual activity: No   Other Topics Concern  . Not on file   Social History Narrative  . No narrative on file    Past Surgical History:  Procedure Laterality Date  . ABDOMINAL HYSTERECTOMY    . CESAREAN SECTION    . CHOLECYSTECTOMY      Family History  Problem Relation Age of Onset  . Diabetes Father   . Heart disease Father     Allergies  Allergen Reactions  . Sulfa Antibiotics Rash    Current Outpatient Prescriptions on File Prior to Visit  Medication Sig  Dispense Refill  . albuterol (PROVENTIL HFA;VENTOLIN HFA) 108 (90 Base) MCG/ACT inhaler Inhale 2 puffs into the lungs every 6 (six) hours as needed for wheezing or shortness of breath. 1 Inhaler 2  . dicyclomine (BENTYL) 20 MG tablet Take by mouth. Take 1 tablet by mouth every 6 hours as needed.    . fluticasone (FLONASE) 50 MCG/ACT nasal spray Place 2 sprays into both nostrils daily. 16 g 1  . mupirocin ointment (BACTROBAN) 2 % Apply to area twice daily 22 g 0  . NON FORMULARY [facility]  Achilles Tendonitis Cream- Diclofenac 3%, Baclofen 2%, Bupivacaine 1%, Doxepin 5%, Gabapentin 6%, Ibuprofen 3%, Pentoxifylline 3% Apply 1-2 grams to affected area 3-4 times daily Qty. 120 gm 3 refills    . ondansetron (ZOFRAN ODT) 8 MG disintegrating tablet Take 1 tablet (8 mg total) by mouth every 8 (eight) hours as needed for nausea or vomiting. 12 tablet 0  . VICTOZA 18 MG/3ML SOPN INJECT 1.8 MG INTO THE SKIN ONCE DAILY 18 mL 0   No current facility-administered medications on file prior to visit.     BP 123/75   Pulse 91   Temp 98.1 F (36.7 C) (Oral)   Resp 16   Ht 5\' 3"  (1.6 m)   Wt 257 lb 12.8 oz (116.9 kg)   SpO2 98%  BMI 45.67 kg/m       Objective:   Physical Exam  General  Mental Status - Alert. General Appearance - Well groomed. Not in acute distress.  Skin Rashes- No Rashes.  HEENT Head- Normal. Ear Auditory Canal - Left- Normal. Right - Normal.Tympanic Membrane- Left- Normal. Right- Normal. Eye Sclera/Conjunctiva- Left- Normal. Right- Normal. Nose & Sinuses Nasal Mucosa- Left-  Boggy and Congested. Right-  Boggy and  Congested.Bilateral  Rt side maxillary and  frontal sinus pressure.No left side sinus pressure/pain today.  Mouth & Throat Lips: Upper Lip- Normal: no dryness, cracking, pallor, cyanosis, or vesicular eruption. Lower Lip-Normal: no dryness, cracking, pallor, cyanosis or vesicular eruption. Buccal Mucosa- Bilateral- No Aphthous ulcers. Oropharynx- No  Discharge or Erythema. Tonsils: Characteristics- Bilateral- No Erythema or Congestion. Size/Enlargement- Bilateral- No enlargement. Discharge- bilateral-None.  Neck Neck- Supple. No Masses.   Chest and Lung Exam Auscultation: Breath Sounds:-Clear even and unlabored.  Cardiovascular Auscultation:Rythm- Regular, rate and rhythm. Murmurs & Other Heart Sounds:Ausculatation of the heart reveal- No Murmurs.  Lymphatic Head & Neck General Head & Neck Lymphatics: Bilateral: Description- No Localized lymphadenopathy.       Assessment & Plan:  You appear to have a sinus infection(with likely allergic component). I am prescribing  doxycycline antibiotic for the infection. To help with the nasal congestion I prescribed flonase nasal steroid. For your associated cough, I prescribed cough medicine benzonatate  After you finish with antibiotics can use claritin if needed as we approach fall..  Rest, hydrate, tylenol for fever.  Follow up in 7 days or as needed.  Jimmie Dattilio, Percell Miller, PA-C

## 2017-03-02 ENCOUNTER — Other Ambulatory Visit: Payer: Self-pay | Admitting: Medical

## 2017-03-02 ENCOUNTER — Other Ambulatory Visit: Payer: Self-pay | Admitting: Podiatry

## 2017-03-02 DIAGNOSIS — Z1231 Encounter for screening mammogram for malignant neoplasm of breast: Secondary | ICD-10-CM

## 2017-03-03 LAB — HEMOGLOBIN A1C
HEMOGLOBIN A1C: 7.2 %{Hb} — AB (ref <5.7)
Mean Plasma Glucose: 160 (calc)
eAG (mmol/L): 8.9 (calc)

## 2017-03-05 ENCOUNTER — Telehealth: Payer: Self-pay | Admitting: *Deleted

## 2017-03-05 NOTE — Telephone Encounter (Signed)
I left the patient a message to call me back.  I want to see if she would like to schedule her surgery with Dr. Jacqualyn Posey.  He stated her A1c was at an appropriate level for her to proceed with suggested surgery.  "I'm returning your call."  I was calling you on behalf of Dr. Jacqualyn Posey.  He said your A1c was good for you to proceed with surgery.  Would you like to schedule a date for surgery?  "I'd like to schedule it for October 17.  I don't want to do that before because I will be working the [employer].  I'll make $600 a day.  I got to make that money while I can.  I think I need to see him before I can schedule the surgery."  I'll put you down tentatively for October 17.  Dr. Jacqualyn Posey said for you to continue to keep a record of you levels until time of surgery.  "That will be great.  I will keep track of my levels."  I will transfer you to a scheduler.  If she doesn't answer, leave her a message and she will call you back.

## 2017-03-09 ENCOUNTER — Ambulatory Visit (HOSPITAL_BASED_OUTPATIENT_CLINIC_OR_DEPARTMENT_OTHER)
Admission: RE | Admit: 2017-03-09 | Discharge: 2017-03-09 | Disposition: A | Discharge status: Home / Self Care | Payer: 59 | Source: Ambulatory Visit | Attending: Medical | Admitting: Medical

## 2017-03-09 DIAGNOSIS — Z1231 Encounter for screening mammogram for malignant neoplasm of breast: Secondary | ICD-10-CM | POA: Diagnosis not present

## 2017-03-12 ENCOUNTER — Ambulatory Visit (INDEPENDENT_AMBULATORY_CARE_PROVIDER_SITE_OTHER): Payer: 59 | Admitting: Podiatry

## 2017-03-12 ENCOUNTER — Encounter: Payer: Self-pay | Admitting: Podiatry

## 2017-03-12 VITALS — BP 145/93 | HR 94

## 2017-03-12 DIAGNOSIS — M779 Enthesopathy, unspecified: Secondary | ICD-10-CM | POA: Diagnosis not present

## 2017-03-12 DIAGNOSIS — M7732 Calcaneal spur, left foot: Secondary | ICD-10-CM | POA: Diagnosis not present

## 2017-03-12 DIAGNOSIS — M7662 Achilles tendinitis, left leg: Secondary | ICD-10-CM | POA: Diagnosis not present

## 2017-03-12 NOTE — Patient Instructions (Signed)

## 2017-03-15 NOTE — Progress Notes (Signed)
Subjective: Alisha Stephenson presents the office they for surgical preop consultation. She is scheduled for October 17 for gastrocnemius recession, posterior heel spur resection as well as Achilles tendolysis. She's had pain to the back of her left heel for 1.5 years and she has attempted numerous conservative treatments including injections, bracing, stretching, icing, immobilization, EPAT without any significant improvement. Because of this she has less to proceed with surgical intervention. She states that her blood sugars are much more controlled on average is running below 150. Her last A1c which was checked on 03/03/2014 was 7.2. She denies any claudication symptoms. She states that she's never had any difficulty healing the wound were cut. She is not taking any blood thinners. She is no history of blood clots or bleeding disorders with her and the family. She does not smoke. Denies any systemic complaints such as fevers, chills, nausea, vomiting. No acute changes since last appointment, and no other complaints at this time.   Past Medical History:  Diagnosis Date  . Asthma   . Diabetes mellitus without complication ([place])   . Diverticulitis   . Ovarian cyst     Past Surgical History:  Procedure Laterality Date  . ABDOMINAL HYSTERECTOMY    . CESAREAN SECTION    . CHOLECYSTECTOMY       Current Outpatient Prescriptions:  .  albuterol (PROVENTIL HFA;VENTOLIN HFA) 108 (90 Base) MCG/ACT inhaler, Inhale 2 puffs into the lungs every 6 (six) hours as needed for wheezing or shortness of breath., Disp: 1 Inhaler, Rfl: 2 .  benzonatate (TESSALON) 100 MG capsule, Take 1 capsule (100 mg total) by mouth 3 (three) times daily as needed for cough., Disp: 21 capsule, Rfl: 0 .  dicyclomine (BENTYL) 20 MG tablet, Take by mouth. Take 1 tablet by mouth every 6 hours as needed., Disp: , Rfl:  .  doxycycline (VIBRA-TABS) 100 MG tablet, Take 1 tablet (100 mg total) by mouth 2 (two) times daily. Can give caps or  generic, Disp: 20 tablet, Rfl: 0 .  fluticasone (FLONASE) 50 MCG/ACT nasal spray, Place 2 sprays into both nostrils daily., Disp: 16 g, Rfl: 1 .  fluticasone (FLONASE) 50 MCG/ACT nasal spray, Place 2 sprays into both nostrils daily., Disp: 16 g, Rfl: 2 .  mupirocin ointment (BACTROBAN) 2 %, Apply to area twice daily, Disp: 22 g, Rfl: 0 .  NON FORMULARY, [facility]  Achilles Tendonitis Cream- Diclofenac 3%, Baclofen 2%, Bupivacaine 1%, Doxepin 5%, Gabapentin 6%, Ibuprofen 3%, Pentoxifylline 3% Apply 1-2 grams to affected area 3-4 times daily Qty. 120 gm 3 refills, Disp: , Rfl:  .  ondansetron (ZOFRAN ODT) 8 MG disintegrating tablet, Take 1 tablet (8 mg total) by mouth every 8 (eight) hours as needed for nausea or vomiting., Disp: 12 tablet, Rfl: 0 .  VICTOZA 18 MG/3ML SOPN, INJECT 1.8 MG INTO THE SKIN ONCE DAILY, Disp: 18 mL, Rfl: 0  Allergies  Allergen Reactions  . Sulfa Antibiotics Rash    Social History   Social History  . Marital status: Married    Spouse name: N/A  . Number of children: N/A  . Years of education: N/A   Occupational History  . Not on file.   Social History Main Topics  . Smoking status: Former Research scientist (life sciences)  . Smokeless tobacco: Never Used  . Alcohol use No     Comment: rarely  . Drug use: No  . Sexual activity: No   Other Topics Concern  . Not on file   Social History Narrative  .  No narrative on file    Objective: AAO x3, NAD DP/PT pulses palpable bilaterally, CRT less than 3 seconds There is continued tenderness on the area of a prominent retrocalcaneal exostosis off the posterior aspect the left heel. There is also tenderness along the distal portion of the Achilles tendon along the insertion into the calcaneus. Thompson test is negative in the Achilles tendon appears to be intact. Equinus is present. There is mild localized edema as well as faint erythema to the posterior aspect of the heel from irritation from shoes. She states that she has pain on  a daily basis she cannot do her activities due to the pain. There is no pain with lateral compression of the calcaneus there is no other areas of tenderness identified at this time. No open lesions or pre-ulcerative lesions.  No pain with calf compression, swelling, warmth, erythema  Assessment: Posterior heel spur, tendinitis, gastrocnemius equinus  Plan: -All treatment options discussed with the patient including all alternatives, risks, complications.  -I reviewed the patient's x-rays with her and her husband who comes in with her today. Her chart was also reviewed. At this point we discussed further conservative treatment as well as surgical intervention at this point she wishes to proceed with surgical intervention. We had a long discussion in regards the surgery as well as the postoperative course we discussed all alternatives, risks, complications. No promises or guarantees are given osteotomy the surgery and all questions were answered to the best of my ability. I discussed going to formal physical therapy but she declined at this time as she states that she has done numerous treatments at home on her own without any significant improvement she does not think his other help. -The incision placement as well as the postoperative course was discussed with the patient. I discussed risks of the surgery which include, but not limited to, infection, bleeding, pain, swelling, need for further surgery, delayed or nonhealing, painful or ugly scar, numbness or sensation changes, over/under correction, recurrence, transfer lesions, further deformity, hardware failure, DVT/PE, loss of toe/foot/leg. Patient understands these risks and wishes to proceed with surgery. The surgical consent was reviewed with the patient all 3 pages were signed. No promises or guarantees were given to the outcome of the procedure. All questions were answered to the best of my ability. Before the surgery the patient was encouraged to  call the office if there is any further questions. The surgery will be performed at the [facility] on an outpatient basis.  *Will do lovenox postop for DVT ppx and will do oral Keflex. She has a knee scooter at home.   I spent 30 min with her and her husband in a face to face discussion in regards to treatment.   Celesta Gentile, DPM

## 2017-03-18 ENCOUNTER — Other Ambulatory Visit: Payer: Self-pay | Admitting: Medical

## 2017-03-19 MED FILL — VICTOZA 18 MG/3 ML INJECT P: 18 | 30 days supply | Qty: 9 | Fill #0

## 2017-03-23 ENCOUNTER — Telehealth: Payer: Self-pay | Admitting: *Deleted

## 2017-03-23 ENCOUNTER — Other Ambulatory Visit: Payer: Self-pay | Admitting: Podiatry

## 2017-03-23 MED ORDER — CEPHALEXIN 250 MG/5ML PO SUSR: 500.0000 mg | Freq: Three times a day (TID) | ORAL | 0 refills | Status: DC

## 2017-03-23 MED ORDER — PROMETHAZINE HCL 25 MG PO TABS: 25.0000 mg | ORAL_TABLET | Freq: Three times a day (TID) | ORAL | 0 refills | Status: DC | PRN

## 2017-03-23 MED ORDER — ENOXAPARIN SODIUM 40 MG/0.4ML ~~LOC~~ SOLN: 40.0000 mg | SUBCUTANEOUS | 0 refills | Status: DC

## 2017-03-23 NOTE — Progress Notes (Signed)
Lovenox, oral keflex, phenergan sent to her pharmacy for postop use only. Do not start before

## 2017-03-23 NOTE — Telephone Encounter (Signed)
Called patient today just to see how she was doing and patient stated that she was fine and that she was wandering about the medicine that she was going to have to take and I stated I would check with Dr Jacqualyn Posey and per Dr Jacqualyn Posey had medicine sent into [facility] and that they would be there shortly and also asked about Flonase and per Dr Jacqualyn Posey can bring it with her to the surgery center and to call us if any concerns or questions. Alisha Stephenson

## 2017-03-24 ENCOUNTER — Encounter: Payer: Self-pay | Admitting: Podiatry

## 2017-03-24 ENCOUNTER — Telehealth: Payer: Self-pay | Admitting: *Deleted

## 2017-03-24 DIAGNOSIS — M65872 Other synovitis and tenosynovitis, left ankle and foot: Secondary | ICD-10-CM | POA: Diagnosis not present

## 2017-03-24 DIAGNOSIS — M7662 Achilles tendinitis, left leg: Secondary | ICD-10-CM | POA: Diagnosis not present

## 2017-03-24 DIAGNOSIS — M7732 Calcaneal spur, left foot: Secondary | ICD-10-CM | POA: Diagnosis not present

## 2017-03-24 DIAGNOSIS — M25572 Pain in left ankle and joints of left foot: Secondary | ICD-10-CM | POA: Diagnosis not present

## 2017-03-24 DIAGNOSIS — M216X2 Other acquired deformities of left foot: Secondary | ICD-10-CM | POA: Diagnosis not present

## 2017-03-24 NOTE — Telephone Encounter (Signed)
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 Lattie Haw

## 2017-03-24 NOTE — Progress Notes (Signed)
Pre-operative Note  Patient presents to the [facility] today for surgical intervention of the LEFT foot for gastroc recession, achilles tenolysis and heel spur resection with application of a cast. The surgical consent was reviewed with the patient and we discussed the procedure as well as the postoperative course. I again discussed all alternatives, risks, complications. I answered all of their questions to the best of my ability and they wish to proceed with surgery. No promises or guarantees were given as to the outcome of the surgery.   The surgical consent was signed.   Patient is NPO since midnight.  The patient does not have have a history of blood clots or bleeding disorders.   Oral Keflex, phenergan, Lovenox sent to her pharmacy. Percocet 5/325 1-2 tabs PO q4-6h prn pain prescribed for postop. Discussed risks and complications of lovenox.   No further questions.   Celesta Gentile, [place]

## 2017-03-24 NOTE — Telephone Encounter (Signed)
Alisha Stephenson states due to new opoid restrictions, they would like to change pt's Percocet 5/325mg  from 1-2 tablets every 4-6 hours to Percocet 5/325mg  #30 1-2 tablets every 6 hours prn foot pain. Dr. Berton Lan the change.

## 2017-03-26 ENCOUNTER — Telehealth: Payer: Self-pay | Admitting: Podiatry

## 2017-03-26 NOTE — Telephone Encounter (Signed)
I informed pt she only needed to complete the anbiotic given. Pt states she is doing okay, but feeling it today.

## 2017-03-26 NOTE — Telephone Encounter (Signed)
I had surgery on Wednesday and I only have like two more things of antibiotics. I think the pharmacy got it wrong. If you could call me back at [phone]. Thank you.

## 2017-03-29 ENCOUNTER — Ambulatory Visit (INDEPENDENT_AMBULATORY_CARE_PROVIDER_SITE_OTHER): Payer: 59 | Admitting: Podiatry

## 2017-03-29 ENCOUNTER — Ambulatory Visit: Payer: 59

## 2017-03-29 ENCOUNTER — Encounter: Payer: Self-pay | Admitting: Podiatry

## 2017-03-29 VITALS — BP 127/84 | Temp 97.6°F

## 2017-03-29 DIAGNOSIS — M779 Enthesopathy, unspecified: Secondary | ICD-10-CM

## 2017-03-29 DIAGNOSIS — Z978 Presence of other specified devices: Secondary | ICD-10-CM

## 2017-03-29 DIAGNOSIS — M7732 Calcaneal spur, left foot: Secondary | ICD-10-CM

## 2017-03-29 DIAGNOSIS — M7662 Achilles tendinitis, left leg: Secondary | ICD-10-CM | POA: Diagnosis not present

## 2017-03-29 MED ORDER — CEPHALEXIN 500 MG PO CAPS: 500.0000 mg | ORAL_CAPSULE | Freq: Three times a day (TID) | ORAL | 0 refills | Status: DC

## 2017-03-29 MED ORDER — OXYCODONE-ACETAMINOPHEN 5-325 MG PO TABS: 1.0000 | ORAL_TABLET | ORAL | 0 refills | Status: DC | PRN

## 2017-03-29 NOTE — Progress Notes (Signed)
DOS 10.17.18 Lt foot removal of heel spur from back of heel. Achilles tendon debridement( cleaning of tendon), lengthening of heel cord, cast application Lt foot

## 2017-04-01 NOTE — Progress Notes (Signed)
Subjective: Alisha Stephenson is a 51 y.o. is seen today in office s/p left achilles tenolysis, heel spur resection, gastroc recession preformed on 03/24/2017. They state their pain is controlled and she states that she was surprised by not having much pain. She does take Percocet as directed however. She has remained nonweightbearing. No recent injury or trauma.. Denies any systemic complaints such as fevers, chills, nausea, vomiting. No calf pain, chest pain, shortness of breath.   Objective: General: No acute distress, AAOx3  DP/PT pulses palpable 2/4, CRT < 3 sec to all digits.  Protective sensation intact. Motor function intact.  Cast was removed today.  Left foot: Incision is well coapted without any evidence of dehiscence and sutures and staples are intact. There is no surrounding erythema, ascending cellulitis, fluctuance, crepitus, malodor, drainage/purulence. There is mild edema around the surgical site. There is mild pain along the surgical site. She is able to dorsiflex and plantar flex on her own at the Achilles tendon appears to be intact. Thompson test is negative. No other areas of tenderness to bilateral lower extremities.  No other open lesions or pre-ulcerative lesions.  No pain with calf compression, swelling, warmth, erythema.   Assessment and Plan:  Status post left foot surgery, doing well with no complications   -Treatment options discussed including all alternatives, risks, and complications -X-ray was ordered but was not obtained.  -Cast was removed to evaluate the incision which appears to be healing well and there is no evidence of dehiscence or signs of infection. -Antibiotic ointment was applied followed by a bandage. A well-padded below-knee fiberglass cast was applied making sure to pad all bony prominences. -Keflex prophylactically -Ice/elevation -Pain medication as needed-refill pain medication -Monitor for any clinical signs or symptoms of infection and DVT/PE  and directed to call the office immediately should any occur or go to the ER. -Follow-up as scheduled or sooner if any problems arise. In the meantime, encouraged to call the office with any questions, concerns, change in symptoms.   Celesta Gentile, DPM

## 2017-04-02 ENCOUNTER — Telehealth: Payer: Self-pay | Admitting: Podiatry

## 2017-04-02 ENCOUNTER — Telehealth: Payer: Self-pay | Admitting: *Deleted

## 2017-04-02 NOTE — Telephone Encounter (Signed)
I spoke with pt and she states she spoke with Alisha Stephenson and Dr. Jacqualyn Posey had stated it was okay for her to go out to get her hair done by her dtr.

## 2017-04-02 NOTE — Telephone Encounter (Signed)
Patient called and did have surgery last week and just wanted to know if she could go to her daughter's beauty shop and get her hair washed and go to the store and patient stated that she is doing well and taken the pain medicine and is elevating and not going to the bathroom a lot but it may be due to the pain medicine and per Dr Jacqualyn Posey it is ok to go and get her hair done and I stated to call the office if any concerns or questions. Lattie Haw

## 2017-04-02 NOTE — Telephone Encounter (Signed)
I had surgery on 17 October. I had some questions I would like to ask. If someone could please call me back at [phone]. Thank you.

## 2017-04-02 NOTE — Telephone Encounter (Signed)
I left a message at 8:30 this morning and I know you guys leave early on Fridays. I really need to speak to someone since I had surgery on 17 October. Please call me back at [phone].

## 2017-04-05 ENCOUNTER — Telehealth: Payer: Self-pay | Admitting: *Deleted

## 2017-04-05 ENCOUNTER — Encounter: Payer: 59 | Admitting: Podiatry

## 2017-04-05 NOTE — Telephone Encounter (Signed)
Called patient this morning and patient was at [employer] and was doing good and just wanted to see if she could go to work some and does work from home and could elevate and ice and did take a pain pill yesterday and kinda made her loopy and patient stated that she is only going to take a half of a pill from now on and I stated to patient to limit about 15 minutes on the hour and to ice and elevate and to call the office if any concerns or questions. Lattie Haw

## 2017-04-09 ENCOUNTER — Ambulatory Visit (INDEPENDENT_AMBULATORY_CARE_PROVIDER_SITE_OTHER): Payer: 59 | Admitting: Podiatry

## 2017-04-09 ENCOUNTER — Encounter: Payer: Self-pay | Admitting: Podiatry

## 2017-04-09 DIAGNOSIS — Z978 Presence of other specified devices: Secondary | ICD-10-CM

## 2017-04-09 DIAGNOSIS — M7662 Achilles tendinitis, left leg: Secondary | ICD-10-CM

## 2017-04-09 DIAGNOSIS — M7732 Calcaneal spur, left foot: Secondary | ICD-10-CM

## 2017-04-09 MED ORDER — ENOXAPARIN SODIUM 40 MG/0.4ML ~~LOC~~ SOLN: 40.0000 mg | SUBCUTANEOUS | 0 refills | Status: DC

## 2017-04-13 NOTE — Progress Notes (Signed)
Subjective: Alisha Stephenson is a 51 y.o. is seen today in office s/p left achilles tenolysis, heel spur resection, gastroc recession preformed on 03/24/2017. She states that she is doing well and she is not taking any pain medication she has returned to work although working from home. She has been nonweightbearing. She denies any recent injury or trauma. She is very happy and painful at this point of her surgery as she states "it is nowhere as bad as I thought it was going to be". Denies any systemic complaints such as fevers, chills, nausea, vomiting. No calf pain, chest pain, shortness of breath.   Objective: General: No acute distress, AAOx3  DP/PT pulses palpable 2/4, CRT < 3 sec to all digits.  Protective sensation intact. Motor function intact.  Cast was removed today.  Left foot: Incision is well coapted without any evidence of dehiscence and sutures and staples are intact. There is no surrounding erythema, ascending cellulitis, fluctuance, crepitus, malodor, drainage/purulence. There is mild edema around the surgical site. There is mild pain along the surgical site. She is able to dorsiflex and plantar flex on her own at the Achilles tendon appears to be intact. Thompson test is negative. Tendon intact.  No other areas of tenderness to bilateral lower extremities.  No other open lesions or pre-ulcerative lesions.  No pain with calf compression, swelling, warmth, erythema.   Assessment and Plan:  Status post left foot surgery, doing well with no complications   -Treatment options discussed including all alternatives, risks, and complications -After discussion was going to put her into a cam boot believes sutures and staples intact that we should start some gentle range of motion exercises. Upon plantar the cam boot she states this hurts therefore she requested another cast today. Into buttock wound and was placed on the incision followed by a dry sterile dressing. A well-padded below-knee  fiberglass cast was applied making sure to pad all bony prominences. She's been taking aspirin daily but she is asking about going back on the Lovenox to help prevent blood clots. After discussion she wants to do 2 more weeks Lovenox and hold off on the aspirin. This was sent to her pharmacy. -Continue ice and elevation. -She can continue to work from home as long she is not taking pain medicine. -Monitor for any clinical signs or symptoms of infection and DVT/PE and directed to call the office immediately should any occur or go to the ER. -Follow-up as scheduled or sooner if any problems arise. In the meantime, encouraged to call the office with any questions, concerns, change in symptoms.   Celesta Gentile, DPM

## 2017-04-14 MED FILL — VICTOZA 18 MG/3 ML INJECT P: 18 | 30 days supply | Qty: 9 | Fill #1

## 2017-04-15 ENCOUNTER — Telehealth: Payer: Self-pay | Admitting: Podiatry

## 2017-04-15 NOTE — Telephone Encounter (Signed)
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 I asked pt how it felt now and she said not as swollen or tight. Dr. Jacqualyn Posey stated have pt come in to have cast bivalved by Gretta Arab, RN. Pt asked what I I would do and I told her rest more to keep the swelling down, come in to have the cast bivalved to prevent DVT and pt stated she'll come in tomorrow. I transferred to Schedulers to get on nurses schedule.

## 2017-04-15 NOTE — Telephone Encounter (Signed)
I has surgery done by Dr. Jacqualyn Posey back on 17 October. I went out for a little bit last night to get my hair done. I kept my foot elevated, not dangling. By the time I got out of there my foot and leg looked like a can of busted biscuits. If I could get a call back at [phone]. Thank you.

## 2017-04-16 ENCOUNTER — Ambulatory Visit (INDEPENDENT_AMBULATORY_CARE_PROVIDER_SITE_OTHER): Payer: 59

## 2017-04-16 VITALS — BP 130/87 | Temp 98.2°F

## 2017-04-16 DIAGNOSIS — M7732 Calcaneal spur, left foot: Secondary | ICD-10-CM

## 2017-04-16 DIAGNOSIS — M7662 Achilles tendinitis, left leg: Secondary | ICD-10-CM | POA: Diagnosis not present

## 2017-04-16 DIAGNOSIS — Z978 Presence of other specified devices: Secondary | ICD-10-CM

## 2017-04-19 ENCOUNTER — Encounter: Payer: Self-pay | Admitting: Podiatry

## 2017-04-19 ENCOUNTER — Ambulatory Visit (HOSPITAL_BASED_OUTPATIENT_CLINIC_OR_DEPARTMENT_OTHER)
Admission: RE | Admit: 2017-04-19 | Discharge: 2017-04-19 | Disposition: A | Discharge status: Home / Self Care | Payer: 59 | Source: Ambulatory Visit | Attending: Podiatry | Admitting: Podiatry

## 2017-04-19 ENCOUNTER — Ambulatory Visit (INDEPENDENT_AMBULATORY_CARE_PROVIDER_SITE_OTHER): Payer: 59 | Admitting: Podiatry

## 2017-04-19 DIAGNOSIS — M25475 Effusion, left foot: Secondary | ICD-10-CM

## 2017-04-19 DIAGNOSIS — M7662 Achilles tendinitis, left leg: Secondary | ICD-10-CM

## 2017-04-19 DIAGNOSIS — M7732 Calcaneal spur, left foot: Secondary | ICD-10-CM

## 2017-04-19 DIAGNOSIS — R600 Localized edema: Secondary | ICD-10-CM | POA: Diagnosis not present

## 2017-04-19 NOTE — Progress Notes (Signed)
Patient presents stating that she is having some pain in her foot and calf and she has had a couple episodes of swelling. She states the pain is on the back of her heel and calf   Removed cast. Noted well healing surgical incisions, all staples and sutures in place. No redness of drainage. Noted mild swelling that was WNL for this post op period. Tenderness on palpation of calf near to incision line. No warmth, redness or swelling to calf. Patient denied SHOB, chest pain or any other s/s of DVT. She was examined by Dr Paulla Dolly as well. She states that she is currently still taking her Lovenox daily.   Advised her to continue with Lovenox injections until Monday when she was re-evaluated by Dr Jacqualyn Posey, also informed patient of s/s of DVT and to immediatly go to the ED if experiencing any of those symptoms.  Re-casted at 90 degrees, patient stated comfort with cast and positioning of foot.   She is to keep her appt and call if any acute symptom arise

## 2017-04-22 NOTE — Progress Notes (Signed)
Subjective: Alisha Stephenson is a 51 y.o. is seen today in office s/p left achilles tenolysis, heel spur resection, gastroc recession preformed on 03/24/2017.  She states that overall she is doing well and her pain is improved.  She continues to work from home and she is not taking pain medication.  She has remained nonweightbearing in the cast.  She still gets some pain to her calf that was new at her last appointment when she was seen by Dr. Paulla Dolly.  She is still on the Lovenox injections.  She denies any recent injury or fall.  Denies any systemic complaints such as fevers, chills, nausea, vomiting. No calf pain, chest pain, shortness of breath.   Objective: General: No acute distress, AAOx3  DP/PT pulses palpable 2/4, CRT < 3 sec to all digits.  Protective sensation intact. Motor function intact.  Cast was removed today.  Left foot: Incision is well coapted without any evidence of dehiscence and sutures and staples are intact.  These were removed and the incision remained well coapted and healing well.  There is no drainage.  There is no surrounding erythema, ascending sialitis.  There is no clinical signs of infection present today.  She is able to dorsiflex and plantarflex.  The Achilles tendon appears to be intact.  Thompson test is negative.  Tenderness along the incision although mild there is some pain with calf compression although mild there is no erythema or increased warmth. No other areas of tenderness to bilateral lower extremities.  No other open lesions or pre-ulcerative lesions.  No pain with calf compression, swelling, warmth, erythema.   Assessment and Plan:  Status post left foot surgery, doing well with no complications   -Treatment options discussed including all alternatives, risks, and complications -Cast was removed as well as sutures and the staples without any complications.  After removal incision remains well-healed to both incisions.  Antibiotic ointment was applied  followed by a bandage.  Keep the dressing clean, dry, intact.  I want her to remain nonweightbearing.  We tried putting her into a boot today but it was uncomfortable for her so I put her back in 2 posterior splint.  I did this so that we would get a venous duplex to rule out a DVT given the continued calf pain although she is on Lovenox as suspicion is low.  Continue ice and elevation. -Follow-up 2 weeks or sooner if needed -Monitor for any clinical signs or symptoms of infection and directed to call the office immediately should any occur or go to the ER.  Celesta Gentile, DPM

## 2017-04-26 ENCOUNTER — Ambulatory Visit (INDEPENDENT_AMBULATORY_CARE_PROVIDER_SITE_OTHER): Payer: 59 | Admitting: Podiatry

## 2017-04-26 DIAGNOSIS — M7662 Achilles tendinitis, left leg: Secondary | ICD-10-CM

## 2017-04-26 DIAGNOSIS — M7732 Calcaneal spur, left foot: Secondary | ICD-10-CM

## 2017-04-27 ENCOUNTER — Telehealth: Payer: Self-pay | Admitting: Podiatry

## 2017-04-27 MED ORDER — CEPHALEXIN 250 MG/5ML PO SUSR: 500.0000 mg | Freq: Three times a day (TID) | ORAL | 0 refills | Status: DC

## 2017-04-27 NOTE — Telephone Encounter (Signed)
Pt states she is icing and elevating, but she is still having swelling, and she didn't have it after surgery and she is concerned. I told pt that Dr. Jacqualyn Posey would be in today and I would inform him and call again. Pt states he told her she could go out and do some shopping and she is just concerned because every time she goes out she has swelling. I told pt that she just said it, every time she goes out she has swelling and that may just be because her foot is below her heart even though she is still non-weight bearing. I asked pt if she was still on the Lovenox and she stated no.

## 2017-04-27 NOTE — Telephone Encounter (Signed)
I think the swelling is normal still for her. She was on lovenox and is now on aspirin, but have her stop the ASA. The venous duplex is negative. The tendon appeared to be intact without any tear last appointment. We can do an antibiotic in case of infection, but I doubt this. It seems that it is more of dependent edema, which is normal 4 weeks post-op

## 2017-04-27 NOTE — Telephone Encounter (Signed)
I informed pt of Dr. Leigh Aurora orders and pt states understanding.

## 2017-04-27 NOTE — Telephone Encounter (Signed)
I'm calling about swelling. If you could please call me back at [phone]. Thank you.

## 2017-04-27 NOTE — Progress Notes (Signed)
Subjective: Alisha Stephenson is a 51 y.o. is seen today in office s/p left achilles tenolysis, heel spur resection, gastroc recession preformed on 03/24/2017.  She presents today for follow-up evaluation.  She still states that if she leaves the house when she goes out and her foot is down she still gets swelling that she is concerned about.  She did have a venous duplex which was negative for DVT.  She is not taking any pain medication.  She has finished the Lovenox injections and she is taking aspirin daily.  She denies any recent injury or trauma. She denies any recent injury or fall.  Denies any systemic complaints such as fevers, chills, nausea, vomiting. No calf pain, chest pain, shortness of breath.   Objective: General: No acute distress, AAOx3  DP/PT pulses palpable 2/4, CRT < 3 sec to all digits.  Protective sensation intact. Motor function intact.  Cast was removed today.  Left foot: Incision is well coapted without any evidence of dehiscence..   The incision appears to be healing well. There is no surrounding erythema, ascending cellulitis.  There is no drainage or pus.  There is no clinical signs of infection.  There is no significant tenderness palpation of the surgical site but there is some very minimal discomfort along the distal portion more along the mid substance the incision but the incision is healing well and this pain is more from postsurgical pain.  There is discomfort try to get the foot 90 degrees however she is able to do this.  She is able to actively dorsiflex and plantarflex with and without resistance.  Thompson test is negative and Achilles tendon appears to be intact. No other open lesions or pre-ulcerative lesions There is no pain with calf compression, erythema or warmth.  There is no second swelling to the leg today. No pain with calf compression, swelling, warmth, erythema.   Assessment and Plan:  Status post left foot surgery, doing well with no complications    -Treatment options discussed including all alternatives, risks, and complications  -Incision appears to be healing well but recommended continue with antibiotic ointment dressing. She can start to shower but afterwards recommend antibiotic ointment dressings. -Discussed range of motion exercises to help increase dorsiflexion.  We tried putting her cam boot today but due to the positions is uncomfortable for her so he remained in the splint but I want her to start range of motion exercises so that we would get into the boot next appointment and start physical therapy.  For now continue nonweightbearing.  Ice and elevation.  She has intermittent swelling but this appears to be more of a dependent edema.  We will continue to monitor. -Monitor for any clinical signs or symptoms of infection and directed to call the office immediately should any occur or go to the ER. -RTC 2 weeks or sooner if needed.   Celesta Gentile, DPM

## 2017-04-27 NOTE — Addendum Note (Signed)
Addended by: Harriett Sine D on: 04/27/2017 03:35 PM   Modules accepted: Orders

## 2017-05-05 ENCOUNTER — Telehealth: Payer: Self-pay | Admitting: Podiatry

## 2017-05-05 NOTE — Telephone Encounter (Signed)
This is [employer]. We are calling to get a fax number and address for medical records. Please call us at [phone] and our fax number is [phone]. Please refer to case# [id number]. Thank you for your time.

## 2017-05-05 NOTE — Telephone Encounter (Signed)
Called and spoke with Vinod at [employer]. Asked if this was for an actual medical records request or for short term disability. Vinod stated it is for short term disability and that there is a form that needs to be completed. I told him Marcie Bal is who takes care of that and that she is here on Wednesday's and Thursday's. He said they faxed a request yesterday and today to [phone] which I verified is our fax number. I told him to fax again to [phone] because we were having trouble with our fax machine yesterday. I told him to make the fax attention Marcie Bal and I would make sure she got it.

## 2017-05-06 ENCOUNTER — Encounter: Payer: Self-pay | Admitting: Podiatry

## 2017-05-06 ENCOUNTER — Other Ambulatory Visit: Payer: Self-pay | Admitting: Podiatry

## 2017-05-06 MED ORDER — CEPHALEXIN 250 MG/5ML PO SUSR: 500.0000 mg | Freq: Three times a day (TID) | ORAL | 0 refills | Status: DC

## 2017-05-06 NOTE — Progress Notes (Signed)
Patient called the on-call number and Dr. Amalia Hailey called me to let me know that she had called.  I called her back as a courtesy to see how she was doing.  She states that along the incision there are some areas of white dots present.  She states there is been a small amount of dark blood is coming from the distal portion of the incision that she has noticed towards the heel but denies seeing any pus.  She states there is no redness or red streaks and is not warm to touch.  She states that she went out black Friday shopping on Thursday night after Thanksgiving and she put the splint on but it caused pain and since then she has not been wearing a splint or boot however she has been nonweightbearing.  She has been keeping the incision clean with antibiotic ointment and a dressing daily.  Due to the concern for possible infection and possibly opening of the incision although mild will start Keflex which I sent to her pharmacy and recommended to keep the area clean with Betadine.  I sent the prescription to her pharmacy and her husband will pick it up tonight.  I told her to come the office at 730 tomorrow morning and we will check at that point we will get her into a boot so that we can be immobilized.  She verbalized understanding and she was very thankful and she states that "I am so glad y'all are my doctors".

## 2017-05-07 ENCOUNTER — Ambulatory Visit (INDEPENDENT_AMBULATORY_CARE_PROVIDER_SITE_OTHER): Payer: 59 | Admitting: Podiatry

## 2017-05-07 ENCOUNTER — Encounter: Payer: Self-pay | Admitting: Podiatry

## 2017-05-07 ENCOUNTER — Telehealth: Payer: Self-pay | Admitting: *Deleted

## 2017-05-07 VITALS — BP 120/70 | HR 84 | Temp 97.5°F

## 2017-05-07 DIAGNOSIS — M7662 Achilles tendinitis, left leg: Secondary | ICD-10-CM | POA: Diagnosis not present

## 2017-05-07 NOTE — Telephone Encounter (Signed)
Called patient at 3:42 pm today to check how she was doing since her appointment this morning due to patient stating that when she left her foot was pulling and I stated that I would check with Dr Jacqualyn Posey and I checked with Dr Jacqualyn Posey and he stated that was normal and the foot is in a certain position and when you have it in the boot the foot does go to the 90 degree and the patient stated that she has had it off since 12:30 today and will put it back on when my husband gets home and to call the office if any concerns or questions. Lattie Haw

## 2017-05-07 NOTE — Progress Notes (Signed)
Subjective: Ms. Fackler presents the office today for concerns of drainage from her incision site.  She states that she did not get the antibiotic that I called in for her last night as there was a problem with the pharmacy.  She started putting Betadine on the area last night has been looking better.  She has noticed some bloody drainage come from the incision but denies any pus.  She denies any warmth or redness.  She states that her pain is actually improved.  She has not had any fevers or chills.  She denies any recent injury or trauma.  She has not been wearing the splint or the boot and she has just been putting a bandage on the area.  She has been cleaning with soap and water and drying.  She has no other concerns today. Denies any systemic complaints such as fevers, chills, nausea, vomiting. No acute changes since last appointment, and no other complaints at this time.   Objective: AAO x3, NAD DP/PT pulses palpable bilaterally, CRT less than 3 seconds The gastrocnemius recession site appears to be well-healed.  On the distal portion of the incision just posterior to the heel there is very slight superficial opening but unable to express any drainage or pus today.  There is no probing, undermining or tunneling.  Is no significant surrounding erythema there is no ascending cellulitis.  There is no fluctuation or crepitation.  There is no malodor.  There is no warmth of the area.  There is trace swelling.  Small amount of bruising to the lateral heel.  Achilles tendon appears to be intact and Thompson test is negative. No open lesions or pre-ulcerative lesions.  No pain with calf compression, swelling, warmth, erythema  Assessment: Superficial wound dehiscence distal incision  Plan: -All treatment options discussed with the patient including all alternatives, risks, complications.  -Keflex has been ordered for her but due to issue with the pharmacy she has not been able to get this.  She is on  call the pharmacy today.  I want her to continue with Betadine wet-to-dry dressing changes daily but only small amount.  Hold off on getting the incision wet and hold off on showering.  Cam boot was applied today.  I want her to continue mobilization.  Hold off on any range of motion exercises.  Monitor for any signs or symptoms of infection is worsening or any pus she is to call the office immediately or go directly to the emergency room.  Also discussed glucose control.  Her blood sugars been running between 170-180. -There is some loose skin along these periphery of the incision and I did debride this lightly. -RTC 1 week or sooner if needed.  -Patient encouraged to call the office with any questions, concerns, change in symptoms.   Trula Slade DPM

## 2017-05-10 ENCOUNTER — Encounter: Payer: 59 | Admitting: Podiatry

## 2017-05-10 ENCOUNTER — Telehealth: Payer: Self-pay | Admitting: Podiatry

## 2017-05-10 NOTE — Telephone Encounter (Signed)
Alisha Stephenson- can you do a Rx for her? Thanks.

## 2017-05-10 NOTE — Telephone Encounter (Signed)
Alisha Stephenson called to let you know that [name] is in her network. She would like to have PT there(she said shes ready to walk)

## 2017-05-11 NOTE — Telephone Encounter (Signed)
I spoke with the patient and stated that Dr Jacqualyn Posey puts in an order for PT and PT goes by what the doctor has written and patient will be not weight bearing for PT. Lattie Haw

## 2017-05-13 ENCOUNTER — Other Ambulatory Visit: Payer: Self-pay | Admitting: Medical

## 2017-05-14 ENCOUNTER — Other Ambulatory Visit: Payer: Self-pay

## 2017-05-14 ENCOUNTER — Ambulatory Visit (INDEPENDENT_AMBULATORY_CARE_PROVIDER_SITE_OTHER): Payer: 59 | Admitting: Podiatry

## 2017-05-14 DIAGNOSIS — M25672 Stiffness of left ankle, not elsewhere classified: Secondary | ICD-10-CM | POA: Diagnosis not present

## 2017-05-14 DIAGNOSIS — M7662 Achilles tendinitis, left leg: Secondary | ICD-10-CM

## 2017-05-14 DIAGNOSIS — M25472 Effusion, left ankle: Secondary | ICD-10-CM | POA: Diagnosis not present

## 2017-05-14 DIAGNOSIS — M25572 Pain in left ankle and joints of left foot: Secondary | ICD-10-CM | POA: Diagnosis not present

## 2017-05-14 MED FILL — VICTOZA 18 MG/3 ML INJECT P: 18 | 60 days supply | Qty: 18 | Fill #0

## 2017-05-19 NOTE — Progress Notes (Signed)
Subjective: Alisha Stephenson is a 51 y.o. is seen today in office s/p left achilles tenolysis, heel spur resection, gastroc recession preformed on 03/24/2017.  She also presents today for follow-up evaluation of the superficial dehiscence of the inferior portion of the incision.  She states that she still has some tenderness on the incision but overall she is doing better.  She did follow-up with physical therapy today and the dressing was changed.  They started some passive range of motion exercises.  She is not taking any pain medication.  She denies any other acute changes since last appointment.  She has no systemic complaints. Denies any systemic complaints such as fevers, chills, nausea, vomiting. No calf pain, chest pain, shortness of breath.   Objective: General: No acute distress, AAOx3  DP/PT pulses palpable 2/4, CRT < 3 sec to all digits.  Protective sensation intact. Motor function intact.  Cast was removed today.  Left foot: Incision is well coapted without any evidence of dehiscence along the gastrocnemius recession site as well as the superior portion of the incision.  On the inferior portion of the heel spur resection incision there is still superficial dehiscence but there is no probing and there is no drainage or pus expressed today there is macerated tissue on the area and this still appears to be a superficial wound dehiscence.  There is no significant surrounding erythema there is no ascending sialitis.  There is no warmth.  There is no pus.  There is no loculation or crepitation.  There is no malodor.  There is no probing to bone, undermining or tunneling.  Minimal pain to this area.  Achilles tendon appears to be intact.  Thompson test is negative. There is no pain with calf compression, erythema or warmth.  The calf is supple there is no evidence of DVT today.  Assessment and Plan:  Status post left foot surgery  -Treatment options discussed including all alternatives, risks, and  complications  Incision appears to be healing.  I reapplied Steri-Strips today applied Betadine wet-to-dry and wanted to continue this as well.  She feels the Betadine is helping.  She does not wear her cam boot all the time in the house and-I discussed with her that if she does not wear the boot she needs to remain nonweightbearing and not to sit and move her ankle.  Although physical therapy wanted her to move her ankle every hour I want her to may be this just a couple times a day to work on the strength until the incision heals more.  Monitor for any signs or symptoms of infection which were discussed and to call the office immediately should any occur. -Continue cam boot at all times  -Nonweightbearing until incision heals -Follow-up in 1 week or sooner if needed. -Monitor for any clinical signs or symptoms of infection and directed to call the office immediately should any occur or go to the ER.  Trula Slade DPM

## 2017-05-21 ENCOUNTER — Ambulatory Visit (INDEPENDENT_AMBULATORY_CARE_PROVIDER_SITE_OTHER): Payer: 59 | Admitting: Podiatry

## 2017-05-21 ENCOUNTER — Encounter: Payer: Self-pay | Admitting: Podiatry

## 2017-05-21 DIAGNOSIS — M7662 Achilles tendinitis, left leg: Secondary | ICD-10-CM

## 2017-05-21 DIAGNOSIS — T8130XA Disruption of wound, unspecified, initial encounter: Secondary | ICD-10-CM

## 2017-05-22 LAB — HEMOGLOBIN A1C
Hgb A1c MFr Bld: 6.9 % of total Hgb — ABNORMAL HIGH (ref <5.7)
MEAN PLASMA GLUCOSE: 151 (calc)
eAG (mmol/L): 8.4 (calc)

## 2017-05-22 LAB — CBC WITH DIFFERENTIAL/PLATELET
BASOS PCT: 0.7 %
Basophils Absolute: 63 cells/uL (ref 0–200)
Eosinophils Absolute: 180 cells/uL (ref 15–500)
Eosinophils Relative: 2 %
HCT: 43.9 % (ref 35.0–45.0)
Hemoglobin: 15.1 g/dL (ref 11.7–15.5)
Lymphs Abs: 2790 cells/uL (ref 850–3900)
MCH: 29.4 pg (ref 27.0–33.0)
MCHC: 34.4 g/dL (ref 32.0–36.0)
MCV: 85.4 fL (ref 80.0–100.0)
MONOS PCT: 5.4 %
MPV: 11.3 fL (ref 7.5–12.5)
NEUTROS PCT: 60.9 %
Neutro Abs: 5481 cells/uL (ref 1500–7800)
PLATELETS: 225 10*3/uL (ref 140–400)
RBC: 5.14 10*6/uL — AB (ref 3.80–5.10)
RDW: 12.9 % (ref 11.0–15.0)
TOTAL LYMPHOCYTE: 31 %
WBC: 9 10*3/uL (ref 3.8–10.8)
WBCMIX: 486 {cells}/uL (ref 200–950)

## 2017-05-22 LAB — SEDIMENTATION RATE: SED RATE: 34 mm/h — AB (ref 0–30)

## 2017-05-22 LAB — C-REACTIVE PROTEIN: CRP: 43.1 mg/L — ABNORMAL HIGH (ref <8.0)

## 2017-05-24 ENCOUNTER — Ambulatory Visit (INDEPENDENT_AMBULATORY_CARE_PROVIDER_SITE_OTHER): Payer: 59 | Admitting: Podiatry

## 2017-05-24 ENCOUNTER — Encounter: Payer: Self-pay | Admitting: Podiatry

## 2017-05-24 ENCOUNTER — Ambulatory Visit (INDEPENDENT_AMBULATORY_CARE_PROVIDER_SITE_OTHER): Payer: 59

## 2017-05-24 DIAGNOSIS — M7662 Achilles tendinitis, left leg: Secondary | ICD-10-CM

## 2017-05-24 DIAGNOSIS — M25672 Stiffness of left ankle, not elsewhere classified: Secondary | ICD-10-CM | POA: Diagnosis not present

## 2017-05-24 DIAGNOSIS — M25572 Pain in left ankle and joints of left foot: Secondary | ICD-10-CM | POA: Diagnosis not present

## 2017-05-24 DIAGNOSIS — M25472 Effusion, left ankle: Secondary | ICD-10-CM | POA: Diagnosis not present

## 2017-05-24 DIAGNOSIS — T8130XA Disruption of wound, unspecified, initial encounter: Secondary | ICD-10-CM

## 2017-05-24 MED ORDER — DOXYCYCLINE HYCLATE 100 MG PO TABS: 100.0000 mg | ORAL_TABLET | Freq: Two times a day (BID) | ORAL | 0 refills | Status: DC

## 2017-05-24 NOTE — Progress Notes (Signed)
Subjective: Alisha Stephenson is a 51 y.o. is seen today in office s/p left achilles tenolysis, heel spur resection, gastroc recession preformed on 03/24/2017.  She states that she has more active yesterday she had some increased pain yesterday but overall she feels that she is doing better.  She has been doing some gentle range of motion exercises but not pushing herself.  She does continue Betadine wet-to-dry along the incision site that is mildly open and she denies seeing any drainage or pus and she denies any increase in swelling or redness from the area.  She does wear the cam boot but when she is at home she does leave the boot off. She has no systemic complaints. Denies any systemic complaints such as fevers, chills, nausea, vomiting. No calf pain, chest pain, shortness of breath.   Objective: General: No acute distress, AAOx3  DP/PT pulses palpable 2/4, CRT < 3 sec to all digits.  Protective sensation intact. Motor function intact.  Cast was removed today.  Left foot: Incision is well coapted without any evidence of dehiscence along the gastrocnemius recession site as well as the superior portion of the incision.  Along the inferior portion of the incision still superficial wound dehiscence measuring approximately 1.2 x 0.6 cm.  This is very superficial and I am unable to probe deep and this is appears to be limited to the breakdown of the skin.  There is no drainage or pus expressed.  There is no fluctuation or crepitation.  There is no malodor.  There is no surrounding erythema, ascending cellulitis.  No malodor. Achilles tendon appears to be intact.  Thompson test is negative. There is no pain with calf compression, erythema or warmth.  The calf is supple there is no evidence of DVT today.  Assessment and Plan:  Status post left foot surgery with superficial wound dehiscence  -Treatment options discussed including all alternatives, risks, and complications  -The wound appears to be healing  although slowly.  There is no probing there is no clinical signs of infection.  However I will check blood work to rule out infection.  Continue Betadine wet-to-dry dressing changes daily. -New cam boot and offloading -Nonweightbearing -Ice and elevation -Follow-up in 1 week or sooner if needed. -Monitor for any clinical signs or symptoms of infection and directed to call the office immediately should any occur or go to the ER.  *x-ray next appointment   Trula Slade DPM

## 2017-05-24 NOTE — Progress Notes (Signed)
Subjective: Alisha Stephenson is a 51 y.o. is seen today in office s/p left achilles tenolysis, heel spur resection, gastroc recession preformed on 03/24/2017.  She presents today as she was at physical therapy and I went to discuss lab work results with her and get an x-ray.  She states that over the weekend she is noticing good improvement in the wound healing.  She denies any drainage or pus from the area started to scab over.  She is been continue Betadine to dry on the wound daily.  She denies any redness or red streaks.  She has no systemic complaints such as fevers, chills, nausea, vomiting.  Denies any calf pain, chest pain, shortness of breath.  She has remained nonweightbearing.  She has no other concerns today.  Objective: General: No acute distress, AAOx3  DP/PT pulses palpable 2/4, CRT < 3 sec to all digits.  Protective sensation intact. Motor function intact.  Cast was removed today.  Left foot: Incision is well coapted without any evidence of dehiscence along the gastrocnemius recession site as well as the superior portion of the incision.  Along the inferior portion of the incision still superficial wound dehiscence measuring approximately 1.2 x 0.6 cm which is about the same in size however started to scab over and is more superficial.  Is no drainage or pus expressed.  There is no area of fluctuation or crepitation.  There is no malodor.  There is no surrounding erythema or ascending cellulitis.  There is no other open sores identified.  There is mild to moderate swelling to the surgical site however she states this decreased after physical therapy. There is no pain with calf compression, erythema or warmth.  The calf is supple there is no evidence of DVT today.  Assessment and Plan:  Status post left foot surgery with superficial wound dehiscence  -Treatment options discussed including all alternatives, risks, and complications  -X-rays were obtained and reviewed with the patient  today.  There appears to be some osseous formation superior to the calcaneus but there is no definitive evidence of acute osteomyelitis identified today. -I reviewed blood work with the patient which revealed elevated ESR and CRP but white blood cell count is normal.  She has no systemic complaints of infection.  However given the delayed healing of the wound as well as her history of diabetes and elevated blood work I will start doxycycline and this was sent to her pharmacy. -We discussed ibuprofen to help for anti-inflammatory.  She denies do over-the-counter medication for this. -Ice elevation.  Also discussed an Ace compression wrap from her foot to her knee to help with the swelling.  There is no signs of DVT present otherwise -Monitor for any clinical signs or symptoms of infection and directed to call the office immediately should any occur or go to the ER. -Follow-up on Friday or sooner if any issues are to arise.  *Repeat blood work in 2 weeks  Matthew R Wagoner DPM  

## 2017-05-25 ENCOUNTER — Telehealth: Payer: Self-pay | Admitting: Podiatry

## 2017-05-25 NOTE — Telephone Encounter (Signed)
This is Raquel Sarna calling from [facility], [facility]. I'm calling to see if I can get a copy of pt's insurance card as we have only gotten a copy of her dental insurance card. If you could please give me a call back at [phone] and our fax number is [phone]. Thank you.

## 2017-05-25 NOTE — Telephone Encounter (Signed)
Called Alisha Stephenson back and spoke with her. I asked Alisha Stephenson if she wanted me to give her the information verbally over the phone or did she want me to fax her a copy of the card. She asked that I fax a copy of the card to her because they will not see the pt again until 28 December. I told her I would fax it to her attention at [phone].

## 2017-05-26 ENCOUNTER — Telehealth: Payer: Self-pay | Admitting: Podiatry

## 2017-05-26 NOTE — Telephone Encounter (Signed)
I called Ms. Leipold today to see how she was doing.  She states that she is doing very well.  She looks of the wound today and she states that it "looks really good".  She states that she has scabbed over the area and she denies any drainage or pus or any redness around the area no warmth.  She has been somewhat nauseated after the antibiotic but she denies any fevers or chills otherwise.  She still having some swelling.  She previously had a venous duplex which is normal and a white blood cell count was normal.  Right continue antibiotics in case of infection.  I will see her back on Friday or sooner if any issues are to arise.  If the wound is doing well may consider doing an unna boot.  She had no questions or concerns today.  Trula Slade

## 2017-05-28 ENCOUNTER — Ambulatory Visit (INDEPENDENT_AMBULATORY_CARE_PROVIDER_SITE_OTHER): Payer: 59 | Admitting: Podiatry

## 2017-05-28 ENCOUNTER — Encounter: Payer: Self-pay | Admitting: Podiatry

## 2017-05-28 DIAGNOSIS — T8130XA Disruption of wound, unspecified, initial encounter: Secondary | ICD-10-CM

## 2017-05-28 MED ORDER — DOXYCYCLINE HYCLATE 100 MG PO TABS: 100.0000 mg | ORAL_TABLET | Freq: Two times a day (BID) | ORAL | 0 refills | Status: DC

## 2017-05-29 NOTE — Progress Notes (Signed)
Subjective: Alisha Stephenson is a 51 y.o. is seen today in office s/p left achilles tenolysis, heel spur resection, gastroc recession preformed on 03/24/2017.  She states that she has been more active today she has had some swelling.  She states that when she puts her foot down or she goes out of the house and she is more active she gets swelling to her foot.  The swelling is not continuous and it does get better with elevation and in the mornings but after she has been more active the foot still swells but she denies any redness or warmth from it swells but it does become painful when it swells.  She is not taking any pain medication however.  She does change the wound dressing daily with Betadine but she also does leave the area uncovered at home.  She also states that she does not wear the boot when at home she is not wearing the boot when she sleeps.  She denies seeing any drainage or pus coming from the wound.  She states that the wound is looking better.  She denies any surrounding redness or any red streaks that she has noticed.  She states that overall she feels well she is just anxious to start rehab and weight on her foot.  She has been working from home.  She has no systemic complaints such as fevers, chills, nausea, vomiting.  Denies any calf pain, chest pain, shortness of breath.  She has remained nonweightbearing.  She has no other concerns today.  Objective: General: No acute distress, AAOx3  DP/PT pulses palpable 2/4, CRT < 3 sec to all digits.  Protective sensation intact. Motor function intact.  Cast was removed today.  Left foot: Incision is well coapted without any evidence of dehiscence along the gastrocnemius recession site as well as the superior portion of the incision.  Along the inferior portion of the incision still superficial wound dehiscence measuring approximately 1 x 0.6 cm which is about the same in size however started to scab over and is more superficial.  I did debride some  of the area today and I was unable to identify any area of drainage or purulence.  Because there is no drainage I did not culture the wound and I do not want to do a superficial wound culture.  There is minimal edema to the area.  Faint erythema but there is no increase in warmth.  There is no area of fluctuation or crepitation.  There is no malodor.  There is no ascending sialitis.  Achilles tendon appears to be intact.  Muscle strength appears to be adequate. There is no pain with calf compression, erythema or warmth.  The calf is supple there is no evidence of DVT today.  Assessment and Plan:  Status post left foot surgery with superficial wound dehiscence  -Treatment options discussed including all alternatives, risks, and complications  -I did sharply debride the wound today I was unable to identify any area of drainage or purulence.  Therefore again I did not culture the area and I have not cultured the wound because there is no drainage and this would be a superficial wound culture.  I would like for her to continue Betadine wet-to-dry dressing changes daily.  I do want her to wear the boot more often especially when she is sleeping.  I think that part of the reason why the wound is not healing is because she is been moving too much.  I want her to  continue nonweightbearing.  Continue doxycycline which I refilled for her today.  We discussed possible debridement next week.  I will recheck an ESR, CRP, CBC.  I will plan on her doing this next week.  She states that she is very thankful for care and she has no further questions or concerns. -Monitor for any clinical signs or symptoms of infection and directed to call the office immediately should any occur or go to the ER.  Trula Slade DPM  I called the patient on Saturday, May 29, 2017 at 11:10 AM.  After she left the appointment yesterday I discussed the case with a couple of colleagues and I think at this point be best to go ahead and  proceed with surgical wound debridement to ensure there is no infection and for deep culture.  I will called her and she is in agreement with this plan.  She will get her blood work checked on Wednesday due to the Christmas holiday we will plan on surgery next Friday.  I will see her in the office next Thursday for evaluation.  She again thank you for calling she had no further questions or concerns but I discussed with her to call the office immediately should any changes occur.  Trula Slade DPM

## 2017-06-02 ENCOUNTER — Telehealth: Payer: Self-pay | Admitting: Podiatry

## 2017-06-02 LAB — CBC WITH DIFFERENTIAL/PLATELET
Basophils Absolute: 50 cells/uL (ref 0–200)
Basophils Relative: 0.6 %
Eosinophils Absolute: 133 cells/uL (ref 15–500)
Eosinophils Relative: 1.6 %
HCT: 42.7 % (ref 35.0–45.0)
Hemoglobin: 14.2 g/dL (ref 11.7–15.5)
Lymphs Abs: 2158 cells/uL (ref 850–3900)
MCH: 29.2 pg (ref 27.0–33.0)
MCHC: 33.3 g/dL (ref 32.0–36.0)
MCV: 87.9 fL (ref 80.0–100.0)
MPV: 11.5 fL (ref 7.5–12.5)
Monocytes Relative: 4.2 %
Neutro Abs: 5611 cells/uL (ref 1500–7800)
Neutrophils Relative %: 67.6 %
Platelets: 180 10*3/uL (ref 140–400)
RBC: 4.86 10*6/uL (ref 3.80–5.10)
RDW: 13.2 % (ref 11.0–15.0)
Total Lymphocyte: 26 %
WBC mixed population: 349 cells/uL (ref 200–950)
WBC: 8.3 10*3/uL (ref 3.8–10.8)

## 2017-06-02 NOTE — Telephone Encounter (Signed)
Phone message taken by C. Glenna Fellows, pt stated she had heartburn due to the antibiotic so stopped it.

## 2017-06-02 NOTE — Telephone Encounter (Signed)
I informed pt of Dr. Leigh Aurora orders. Pt states she is scheduled tomorrow at 3:00pm to sign consents for surgery 06/04/2017.

## 2017-06-02 NOTE — Telephone Encounter (Signed)
That's ok to stop. I think it would also be good for her to be off of it for a few days to take cultures as long as there is no redness, drainage or other signs of infection.

## 2017-06-03 ENCOUNTER — Ambulatory Visit (INDEPENDENT_AMBULATORY_CARE_PROVIDER_SITE_OTHER): Payer: 59 | Admitting: Podiatry

## 2017-06-03 DIAGNOSIS — T8130XA Disruption of wound, unspecified, initial encounter: Secondary | ICD-10-CM

## 2017-06-03 LAB — C-REACTIVE PROTEIN: CRP: 12.4 mg/L — ABNORMAL HIGH (ref <8.0)

## 2017-06-03 LAB — SEDIMENTATION RATE: SED RATE: 28 mm/h (ref 0–30)

## 2017-06-03 MED ORDER — CEPHALEXIN 250 MG/5ML PO SUSR: 500.0000 mg | Freq: Three times a day (TID) | ORAL | 0 refills | Status: DC

## 2017-06-04 ENCOUNTER — Encounter: Payer: 59 | Admitting: Podiatry

## 2017-06-07 NOTE — Progress Notes (Signed)
Subjective: Alisha Stephenson is a 51 y.o. is seen today in office s/p left achilles tenolysis, heel spur resection, gastroc recession preformed on 03/24/2017.  I called her over the weekend to discuss possible surgical debridement of the wound she presents today for further evaluation of the wound and to discuss possible surgical intervention.  She states that the wound is actually looking much better and she is hoping that we can cancel the surgery.  She states that she is happy because she actually left the house yesterday for an extended period of time and she did not have a swelling that she has been having either.  She states the pain is been much improved as well.  He has not noticed any drainage or pus and she denies any surrounding redness or red streaks.  She has been wearing the cam boot more often as well especially at nighttime.  She has been trying to ice and elevate as well and she has been wrapping the legs to help with the swelling with Ace bandages.  She has no new concerns today.  She presents today with her cousin. She has no systemic complaints such as fevers, chills, nausea, vomiting.  Denies any calf pain, chest pain, shortness of breath.  She has remained nonweightbearing.  She has no other concerns today.  Objective: General: No acute distress, AAOx3  DP/PT pulses palpable 2/4, CRT < 3 sec to all digits.  Protective sensation intact. Motor function intact.  Cast was removed today.  Left foot: Incision is well coapted without any evidence of dehiscence along the gastrocnemius recession site as well as the superior portion of the incision.  Along the inferior portion of the incision still superficial wound dehiscence measuring approximately 0.8 x 0.5 cm which is improved and the area actually appears to be almost Completely over the area.  There is no drainage or pus there is no fluctuance or crepitus.  There is no malodor.  The wound actually appears to be much improved compared to what  it was.  There is no surrounding erythema, ascending cellulitis.  There is no fluctuance or crepitus.  There is no malodor.    There is no pain with calf compression, erythema or warmth.  The calf is supple there is no evidence of DVT today. Thompson test is negative.  Assessment and Plan:  Status post left foot surgery with superficial wound dehiscence with evidence of healing  -Treatment options discussed including all alternatives, risks, and complications  -I again discussed with her as well as her family member surgical as well as conservative treatment.  She is hoping not to do surgery as the wound is looking better and I actually agree with her on this.  Her blood work is also much improved.  She has not been taking the doxycycline as it was causing acid reflux.  We will go back to taking oral Keflex as a precaution.  I want her to continue with ice and elevation as well as compression to help with the swelling.  Her swelling is much improved as well.  I would like for her to continue with the cam boot as well.  We will hold off on physical therapy for another week to allow this wound to continue to heal.  Hold off on getting the incision wet.  She agrees this plan she is very happy and she has no other concerns.  She is very thankful of the care. Monitor for any clinical signs or symptoms of infection  and directed to call the office immediately should any occur or go to the ER. -Follow-up in 1 week or sooner if needed.  Trula Slade DPM

## 2017-06-10 ENCOUNTER — Ambulatory Visit: Payer: 59 | Admitting: Podiatry

## 2017-06-11 ENCOUNTER — Encounter: Payer: 59 | Admitting: Podiatry

## 2017-06-11 ENCOUNTER — Ambulatory Visit (INDEPENDENT_AMBULATORY_CARE_PROVIDER_SITE_OTHER): Payer: 59

## 2017-06-11 ENCOUNTER — Ambulatory Visit (INDEPENDENT_AMBULATORY_CARE_PROVIDER_SITE_OTHER): Payer: 59 | Admitting: Podiatry

## 2017-06-11 DIAGNOSIS — T8130XA Disruption of wound, unspecified, initial encounter: Secondary | ICD-10-CM

## 2017-06-11 DIAGNOSIS — M7662 Achilles tendinitis, left leg: Secondary | ICD-10-CM

## 2017-06-14 DIAGNOSIS — M25572 Pain in left ankle and joints of left foot: Secondary | ICD-10-CM | POA: Diagnosis not present

## 2017-06-14 DIAGNOSIS — M25472 Effusion, left ankle: Secondary | ICD-10-CM | POA: Diagnosis not present

## 2017-06-14 DIAGNOSIS — M25672 Stiffness of left ankle, not elsewhere classified: Secondary | ICD-10-CM | POA: Diagnosis not present

## 2017-06-14 NOTE — Progress Notes (Signed)
Subjective: Alisha Stephenson is a 52 y.o. is seen today in office s/p left achilles tenolysis, heel spur resection, gastroc recession preformed on 03/24/2017.  She states that she is doing well.  He still gets some swelling but overall this is improving.  She states that she is having some discomfort today.  She is try to become more active.  She is inquiring about when she can go back to work.  She has been working from home on a regular schedule and she states that she can get to work and do the exact same that she is doing home with keep her foot elevated.  She is been try to wear the cam boot more often but she does state that she has done a couple of nights without wearing it.  She has no new concerns.  She has no recent injury or trauma.  She presents today with her husband. She has no systemic complaints such as fevers, chills, nausea, vomiting.  Denies any calf pain, chest pain, shortness of breath.  She has remained nonweightbearing.  She has no other concerns today.  Objective: General: No acute distress, AAOx3  DP/PT pulses palpable 2/4, CRT < 3 sec to all digits.  Protective sensation intact. Motor function intact.  Cast was removed today.  Left foot: Incision is well coapted without any evidence of dehiscence along the gastrocnemius recession site as well as the superior portion of the incision.  Along the inferior portion of the incision still superficial wound dehiscence has a scab overlying the area looks like a scab is starting to come off on its own.  I was able to sharply debride some of the loose from the inferior portion of the around the area and there was new, pink skin present underneath.  There is no drainage or pus there is no fluctuation or crepitation.  There is no clinical signs of infection noted.  Achilles tendon appears to be intact and Thompson test is negative. There is no pain with calf compression, erythema or warmth.  The calf is supple there is no evidence of DVT  today. Thompson test is negative.      Assessment and Plan:  Status post left foot surgery with superficial wound dehiscence which is healing  -Treatment options discussed including all alternatives, risks, and complications  -Today I did sharply debride some of the loose scab today and there was new, pink skin present.  Continue the small amount of Betadine to the area daily.  There is no signs of infection but continue to monitor closely. -Remain in cam boot at all times but I do want her to start some range of motion exercises as discussed with physical therapy and her.  She can start partial weightbearing as well as she is feeling better.  She can return to work on Monday working a half a day to see how she does.  I want her to continue with wrapping the legs to help with the compression swelling. -Monitor for any clinical signs or symptoms of infection and directed to call the office immediately should any occur or go to the ER. -She has an appointment on Thursday with physical therapy as I will check with her then.Call any questions or concerns and she agrees with this plan.  Trula Slade DPM

## 2017-06-15 ENCOUNTER — Encounter: Payer: Self-pay | Admitting: *Deleted

## 2017-06-15 ENCOUNTER — Telehealth: Payer: Self-pay | Admitting: Podiatry

## 2017-06-15 NOTE — Telephone Encounter (Signed)
I really need Dr. Leigh Aurora nurse Lattie Haw to give me a call back at [phone].

## 2017-06-15 NOTE — Telephone Encounter (Signed)
Pt states Dr. Jacqualyn Posey released her to work 1/2 day Monday, Tuesday and Thursday, full day Wednesday and Friday, fax to [phone], and she will discuss next weeks schedule with Dr. Jacqualyn Posey on Thursday.

## 2017-06-17 ENCOUNTER — Encounter: Payer: Self-pay | Admitting: Podiatry

## 2017-06-17 ENCOUNTER — Ambulatory Visit (INDEPENDENT_AMBULATORY_CARE_PROVIDER_SITE_OTHER): Payer: 59 | Admitting: Podiatry

## 2017-06-17 DIAGNOSIS — M25572 Pain in left ankle and joints of left foot: Secondary | ICD-10-CM | POA: Diagnosis not present

## 2017-06-17 DIAGNOSIS — M25672 Stiffness of left ankle, not elsewhere classified: Secondary | ICD-10-CM | POA: Diagnosis not present

## 2017-06-17 DIAGNOSIS — T8130XA Disruption of wound, unspecified, initial encounter: Secondary | ICD-10-CM

## 2017-06-17 DIAGNOSIS — M25472 Effusion, left ankle: Secondary | ICD-10-CM | POA: Diagnosis not present

## 2017-06-17 DIAGNOSIS — M7662 Achilles tendinitis, left leg: Secondary | ICD-10-CM

## 2017-06-18 ENCOUNTER — Encounter: Payer: 59 | Admitting: Podiatry

## 2017-06-18 NOTE — Progress Notes (Signed)
Subjective: JAMAL HASKIN is a 52 y.o. is seen today in office s/p left achilles tenolysis, heel spur resection, gastroc recession preformed on 03/24/2017.  She states that she is doing better.  She has been to physical therapy twice this week and that showed her how to wrap the leg and that is helped tremendously with the swelling.  She has some mild soreness along the surgical site but denies any specific pain.  She has been doing partial weightbearing for short distances around the house she is asking if she can start walking up steps.  She is also been going to work and she states is either actually going to the office and working from home.  She denies any redness or drainage coming from the wound, incision.  She has no other concerns. Denies any calf pain, chest pain, shortness of breath.  She has remained nonweightbearing.  She has no other concerns today.  Objective: General: No acute distress, AAOx3  DP/PT pulses palpable 2/4, CRT < 3 sec to all digits.  Protective sensation intact. Motor function intact.  Cast was removed today.  Left foot: Incision is well coapted without any evidence of dehiscence along the gastrocnemius recession site as well as the superior portion of the incision.  Along the inferior portion of the incision there is an area of the scab.  I was able to debride this today there is a superficial abrasion like lesion present within the distal portion of the incision however it appears to be healed but is pre-ulcerative.  There is no drainage or pus there is no fluctuation or crepitation.  There is no malodor.  There is no surrounding erythema, ascending cellulitis. There is no pain with calf compression, erythema or warmth.  The calf is supple there is no evidence of DVT today. Thompson test is negative.     Assessment and Plan:  Status post left foot surgery with superficial wound dehiscence which is healing  -Treatment options discussed including all alternatives,  risks, and complications -Today I debrided the area on the distal portion of the heel to remove the scab as it was loose.  Underlying skin appears to be intact.  There is no signs of infection.  I want her to continue the small amount of Betadine to the area daily.  Monitor closely. -Continue cam boot at all times.  She can start the partial weightbearing in the cam boot around the house.  Continue with physical therapy and range of motion exercises. -Ice and elevation -Monitor for any clinical signs or symptoms of infection and directed to call the office immediately should any occur or go to the ER. -She has an appointment on Thursday with physical therapy as I will check with her then. Call any questions or concerns and she agrees with this plan.  She has no further questions or concerns today.  Trula Slade DPM

## 2017-06-24 ENCOUNTER — Encounter: Payer: Self-pay | Admitting: Podiatry

## 2017-06-24 ENCOUNTER — Ambulatory Visit (INDEPENDENT_AMBULATORY_CARE_PROVIDER_SITE_OTHER): Payer: 59 | Admitting: Podiatry

## 2017-06-24 VITALS — BP 114/77 | HR 91

## 2017-06-24 DIAGNOSIS — M25572 Pain in left ankle and joints of left foot: Secondary | ICD-10-CM | POA: Diagnosis not present

## 2017-06-24 DIAGNOSIS — M7662 Achilles tendinitis, left leg: Secondary | ICD-10-CM

## 2017-06-24 DIAGNOSIS — M7732 Calcaneal spur, left foot: Secondary | ICD-10-CM

## 2017-06-24 DIAGNOSIS — M25472 Effusion, left ankle: Secondary | ICD-10-CM | POA: Diagnosis not present

## 2017-06-24 DIAGNOSIS — M25672 Stiffness of left ankle, not elsewhere classified: Secondary | ICD-10-CM | POA: Diagnosis not present

## 2017-06-27 ENCOUNTER — Telehealth: Payer: Self-pay | Admitting: Medical

## 2017-06-27 NOTE — Progress Notes (Signed)
Subjective: Alisha Stephenson is a 52 y.o. is seen today in office s/p left achilles tenolysis, heel spur resection, gastroc recession preformed on 03/24/2017.  She states that she is doing much better and she states that she is ready to start walking up steps and really wants to start more aggressive physical therapy and becoming more active.  She does wear the boot the majority of time she has gone sometimes not sleeping in it.  She has been walking up the steps which is new for her she started this yesterday.  She states that she does "fine" with this and she has not had any increase in pain.  Her swelling is also been much improved.  She denies any drainage or redness along the incision site. She has no other concerns. Denies any calf pain, chest pain, shortness of breath.  She has remained nonweightbearing.  She has no other concerns today.  Objective: General: No acute distress, AAOx3  DP/PT pulses palpable 2/4, CRT < 3 sec to all digits.  Protective sensation intact. Motor function intact.  Cast was removed today.  Left foot: Incision is well coapted without any evidence of dehiscence along the gastrocnemius recession site as well as the superior portion of the incision.  Along the inferior portion of the incision there is an area of the scab which is minimal today.  I was able to debride some of the loose hyperkeratotic tissue.  There is no drainage or pus and there is no fluctuation or crepitation.  There is no malodor.  There is no ascending cellulitis or surrounding erythema.  Mild edema overall but this is also much improved. No clinical signs of infection are noted today. There is no pain with calf compression, erythema or warmth.  The calf is supple there is no evidence of DVT today. Thompson test is negative.       Assessment and Plan:  Status post left foot surgery with superficial wound dehiscence which is healing  -Treatment options discussed including all alternatives, risks, and  complications -I was able to debride some of the loose hyperkeratotic tissue today without any complications or bleeding.  I want her to continue small amount of antibiotic ointment and a Band-Aid to this area daily. -With an insert twice a week physical therapy hopefully.  Also she can start to weight-bear as tolerated in the cam boot but may discuss gradual transition.  Over the last couple of weeks she has been partial weightbearing in order to gradually increase mental weightbearing that she is doing but she must wear the boot at all times.  I want her to look at the scar incisions daily and make sure that there is no opening or worsening if there is just go back to nonweightbearing call the office immediately. -Continue ice and elevation -Compression sock -Follow-up in 10 days or sooner if needed.  Call any questions or concerns meantime.  She agrees this plan she has no further questions or concerns and she is very thankful for the care.  Trula Slade DPM

## 2017-06-27 NOTE — Telephone Encounter (Signed)
I have reviewed about 4 notes from podiatrist. Pt had wound dehiscence /mild opening up. Wound did not heal  completely after weeks post op. So would you let pt know I am thinking wound care maybe a good idea.  Let me know if she is ok with referral.   Also offer her appointment with me to evaluate the area.

## 2017-06-28 ENCOUNTER — Telehealth: Payer: Self-pay | Admitting: Podiatry

## 2017-06-28 NOTE — Telephone Encounter (Signed)
I had physical therapy on Thursday and he was letting me do some walking around in my boot and I'm in pain when I do that now and I wasn't. So if you could please call me back at [phone]. Thank you.

## 2017-06-28 NOTE — Telephone Encounter (Signed)
Go back to NWB, ice, elevation. Then once it calms down then she can start to gradually increase her weightbearing. I think it is more due to her being NWB for 3 months and now she is using it and it is getting painful. She needs to take it slowly.

## 2017-06-28 NOTE — Telephone Encounter (Signed)
I spoke with Dr. Jacqualyn Posey and he said have pt come in this week, remain in the boot, NWB, elevate, ice and rest, passive ROM. I informed pt of Dr. Leigh Aurora orders and transferred to schedulers.

## 2017-06-28 NOTE — Telephone Encounter (Signed)
Pt states the weight bearing worsened after PT Thursday, she pushed the foot toward the knee and held it several times, and I can not weight bear since. Pt states Dr. Jacqualyn Posey stated she would know the difference between sore and painful and this was painful, no pop or anything, but now constantly throbs. Pt states she is cancelling the PT for tomorrow she can not do it. I told pt to rest, ice and elevate use the knee scooter, and I would see if Dr. Jacqualyn Posey wanted to see her earlier than 07/13/2017.

## 2017-06-29 ENCOUNTER — Encounter: Payer: Self-pay | Admitting: Podiatry

## 2017-06-29 ENCOUNTER — Ambulatory Visit (INDEPENDENT_AMBULATORY_CARE_PROVIDER_SITE_OTHER): Payer: 59 | Admitting: Podiatry

## 2017-06-29 VITALS — BP 131/77 | HR 90 | Resp 97

## 2017-06-29 DIAGNOSIS — M7662 Achilles tendinitis, left leg: Secondary | ICD-10-CM

## 2017-06-29 DIAGNOSIS — M779 Enthesopathy, unspecified: Secondary | ICD-10-CM

## 2017-06-29 DIAGNOSIS — M7732 Calcaneal spur, left foot: Secondary | ICD-10-CM

## 2017-06-29 MED ORDER — IBUPROFEN 600 MG PO TABS: 600.0000 mg | ORAL_TABLET | Freq: Three times a day (TID) | ORAL | 0 refills | Status: DC | PRN

## 2017-06-30 NOTE — Progress Notes (Signed)
Subjective: Alisha Stephenson presents the office today for concerns of increasing pain and swelling after she went to physical therapy.  She states that measured at therapy they hyperflexed her ankle and she feels that she has a sprained ankle.  She did not notice any popping or increase in pain in the surgical site but she points along the ankle, medial and lateral aspect where she gets discomfort.  After this happened she called the office and we had her become nonweightbearing.  She has been doing this and her pain is minimally improved but it does continue.  No other injury.  No other concerns.  She says the incision is doing very well she has not had any drainage or opening. Denies any systemic complaints such as fevers, chills, nausea, vomiting. No acute changes since last appointment, and no other complaints at this time.   Objective: AAO x3, NAD DP/PT pulses palpable bilaterally, CRT less than 3 seconds Incision appears to be healed.  There is no open sore identified there is no drainage or pus.  Small scab along the distal portion of the incision.  There is no tenderness on the Achilles tendon or gastrocnemius recession site.  Achilles tendon appears to be intact.  There is tenderness along the course of the flexor tendon just posterior to the medial malleolus along the peroneal tendon.  The tendons appear to be intact.  There is mild increase in swelling but she states the swelling was worse yesterday.  There is no erythema or increase in warmth.  There is no pain with calf compression.  No signs of DVT.  No clinical signs of infection. No open lesions or pre-ulcerative lesions.   Assessment: Healed surgical site with increased pain along the flexor, peroneal tendons likely due to overuse  Plan: -All treatment options discussed with the patient including all alternatives, risks, complications.  -Recommended to return to nonweightbearing in cam boot at all times.  I prescribed ibuprofen 600 mg 3 times a  day to take.  Continue ice and elevation.  As she starts to feel better later this week she can start to be partial weightbearing but there is any increase in pain to return to nonweightbearing.  Hold off on physical therapy on Thursday if she is not improving.  She agrees this plan.  Otherwise I will see her back next week as scheduled or sooner if needed.-Patient encouraged to call the office with any questions, concerns, change in symptoms.   Trula Slade DPM

## 2017-07-01 ENCOUNTER — Telehealth: Payer: Self-pay | Admitting: Podiatry

## 2017-07-01 NOTE — Telephone Encounter (Signed)
I just received a phone call from my PCP and they said that they had been going over Dr. Leigh Aurora notes and wanted to know if I wanted to go to a wound specialist because my foot was not healing right. I'm pretty sure that it's healing okay now because Dr. Jacqualyn Posey said everything is healed and looks better. So I don't think that that's where I need to go but if somebody would give me a call back. My number is [phone]. This is not an emergency. I don't think I need to go to a wound center but my PCP just called and I'm just confused. Okay thank you. Bye.

## 2017-07-01 NOTE — Telephone Encounter (Signed)
Called Pt and she states that her wound is now healed. She followed up with podiatry yesterday and also stated that they feel like the wound is finally healing properly at this time. Pt states that at she will follow up with Pod to see if she might need to follow up with PCP but feels like she's fine and nothing else need at this time.

## 2017-07-01 NOTE — Telephone Encounter (Signed)
I do not think that she needs to be seen by a wound specialist as the wound has healed. If she would like a second opinion about it though she is more than welcome and we can put in a referral for her.

## 2017-07-01 NOTE — Telephone Encounter (Signed)
Dr. Jacqualyn Posey,  No problem. Thanks for your help. I saw last picture and thought would offer her referral since she is diabetic but unaware she got better. Thanks again for helping out with our patients.  Percell Miller

## 2017-07-01 NOTE — Telephone Encounter (Signed)
Called patient to discuss wound care consult. The wound has healed and she does not feel that she needs to go at this time.   In regards to the increase in pain she developed after it was hyper dorsiflexed at PT it is better since I last saw her. She did put some weight on her foot in the boot and it is feeling better but did cancel PT today to allow it to rest and she will go back on Tuesday. As she can she can start to progress to [facility] in a CAM boot but gradual. Ice/elevation.   Did discuss seeing Mackie Pai for a physical and she agreed to this.   No further questions or concerns.   Trula Slade

## 2017-07-02 NOTE — Telephone Encounter (Signed)
She did take longer to heal one part of the incision but over the last 2 appointments it does appear to be healed. From the incision standpoint it looks good. She has been nonweightbearing for 3 months now to allow the incision and the Achilles tendon to heal so we are starting physical therapy. It was going well at first but the last session did push too much and she had a little setback. My goal is to get her walking in the next couple of weeks in the boot alone.   I have done blood work on her as well when the wound opened but that improved. Her A1c was also doing well. I have been stressing this to her and she had done well.   I appreciate you checking in on her.

## 2017-07-02 NOTE — Telephone Encounter (Signed)
I spoke with pt and she states Dr. Jacqualyn Posey called her yesterday. I asked her if she was okay and she states yes she trust Dr. Leigh Aurora knowledge.

## 2017-07-05 ENCOUNTER — Encounter: Payer: Self-pay | Admitting: Medical

## 2017-07-05 ENCOUNTER — Ambulatory Visit (INDEPENDENT_AMBULATORY_CARE_PROVIDER_SITE_OTHER): Payer: 59 | Admitting: Medical

## 2017-07-05 VITALS — BP 118/75 | HR 84 | Temp 97.9°F | Resp 16

## 2017-07-05 DIAGNOSIS — Z0000 Encounter for general adult medical examination without abnormal findings: Secondary | ICD-10-CM | POA: Diagnosis not present

## 2017-07-05 DIAGNOSIS — Z113 Encounter for screening for infections with a predominantly sexual mode of transmission: Secondary | ICD-10-CM

## 2017-07-05 DIAGNOSIS — E119 Type 2 diabetes mellitus without complications: Secondary | ICD-10-CM | POA: Diagnosis not present

## 2017-07-05 LAB — LIPID PANEL
CHOL/HDL RATIO: 4
Cholesterol: 145 mg/dL (ref 0–200)
HDL: 33.5 mg/dL — AB (ref >39.00)
LDL CALC: 79 mg/dL (ref 0–99)
NonHDL: 111.28
TRIGLYCERIDES: 163 mg/dL — AB (ref 0.0–149.0)
VLDL: 32.6 mg/dL (ref 0.0–40.0)

## 2017-07-05 LAB — COMPREHENSIVE METABOLIC PANEL
ALBUMIN: 3.9 g/dL (ref 3.5–5.2)
ALT: 29 U/L (ref 0–35)
AST: 22 U/L (ref 0–37)
Alkaline Phosphatase: 94 U/L (ref 39–117)
BUN: 15 mg/dL (ref 6–23)
CALCIUM: 9 mg/dL (ref 8.4–10.5)
CHLORIDE: 104 meq/L (ref 96–112)
CO2: 30 meq/L (ref 19–32)
Creatinine, Ser: 0.75 mg/dL (ref 0.40–1.20)
GFR: 86.05 mL/min (ref >60.00)
Glucose, Bld: 208 mg/dL — ABNORMAL HIGH (ref 70–99)
POTASSIUM: 4.1 meq/L (ref 3.5–5.1)
Sodium: 143 mEq/L (ref 135–145)
Total Bilirubin: 0.7 mg/dL (ref 0.2–1.2)
Total Protein: 6.9 g/dL (ref 6.0–8.3)

## 2017-07-05 LAB — CBC WITH DIFFERENTIAL/PLATELET
BASOS PCT: 0.7 % (ref 0.0–3.0)
Basophils Absolute: 0.1 10*3/uL (ref 0.0–0.1)
EOS ABS: 0.2 10*3/uL (ref 0.0–0.7)
Eosinophils Relative: 2.1 % (ref 0.0–5.0)
HEMATOCRIT: 43.3 % (ref 36.0–46.0)
Hemoglobin: 14.4 g/dL (ref 12.0–15.0)
LYMPHS PCT: 28 % (ref 12.0–46.0)
Lymphs Abs: 2.2 10*3/uL (ref 0.7–4.0)
MCHC: 33.2 g/dL (ref 30.0–36.0)
MCV: 89.1 fl (ref 78.0–100.0)
MONO ABS: 0.3 10*3/uL (ref 0.1–1.0)
Monocytes Relative: 4.1 % (ref 3.0–12.0)
NEUTROS ABS: 5.2 10*3/uL (ref 1.4–7.7)
Neutrophils Relative %: 65.1 % (ref 43.0–77.0)
PLATELETS: 163 10*3/uL (ref 150.0–400.0)
RBC: 4.86 Mil/uL (ref 3.87–5.11)
RDW: 13.7 % (ref 11.5–15.5)
WBC: 7.9 10*3/uL (ref 4.0–10.5)

## 2017-07-05 LAB — URINALYSIS, ROUTINE W REFLEX MICROSCOPIC
Bilirubin Urine: NEGATIVE
Hgb urine dipstick: NEGATIVE
KETONES UR: NEGATIVE
Leukocytes, UA: NEGATIVE
Nitrite: NEGATIVE
PH: 5.5 (ref 5.0–8.0)
RBC / HPF: NONE SEEN (ref 0–?)
SPECIFIC GRAVITY, URINE: 1.025 (ref 1.000–1.030)
Total Protein, Urine: NEGATIVE
UROBILINOGEN UA: 0.2 (ref 0.0–1.0)
Urine Glucose: NEGATIVE

## 2017-07-05 MED ORDER — OXYBUTYNIN CHLORIDE ER 5 MG PO TB24: 5.0000 mg | ORAL_TABLET | Freq: Every day | ORAL | 1 refills | Status: DC

## 2017-07-05 MED ORDER — FLUTICASONE PROPIONATE 50 MCG/ACT NA SUSP: 2.0000 | Freq: Every day | NASAL | 1 refills | Status: DC

## 2017-07-05 MED FILL — OXYBUTYNIN CL ER 5 MG TAB: 5 | 30 days supply | Qty: 30 | Fill #0

## 2017-07-05 MED FILL — FLUTICASONE PROP 50 MCG SPR: 50 | 30 days supply | Qty: 16 | Fill #0

## 2017-07-05 NOTE — Patient Instructions (Addendum)
For you wellness exam today I have ordered cbc, cmp, lipid panel, ua and hiv.  Vaccine declined both flu vaccine and pneumonia vaccine.  Recommend exercise and healthy diet.  We will let you know lab results as they come in.  Follow up date appointment will be determined after lab review.   For nasal congestion reported today recommend Flonase.  You to follow-up sinus pressure/infectious signs and symptoms notify us and consider antibiotic at that point.  You mentioned some urinary incontinence recently at times today.  Check your urine for infection.  But also prescribed Ditropan.   Preventive Care 40-64 Years, Female Preventive care refers to lifestyle choices and visits with your health care provider that can promote health and wellness. What does preventive care include?  A yearly physical exam. This is also called an annual well check.  Dental exams once or twice a year.  Routine eye exams. Ask your health care provider how often you should have your eyes checked.  Personal lifestyle choices, including: ? Daily care of your teeth and gums. ? Regular physical activity. ? Eating a healthy diet. ? Avoiding tobacco and drug use. ? Limiting alcohol use. ? Practicing safe sex. ? Taking low-dose aspirin daily starting at age 3. ? Taking vitamin and mineral supplements as recommended by your health care provider. What happens during an annual well check? The services and screenings done by your health care provider during your annual well check will depend on your age, overall health, lifestyle risk factors, and family history of disease. Counseling Your health care provider may ask you questions about your:  Alcohol use.  Tobacco use.  Drug use.  Emotional well-being.  Home and relationship well-being.  Sexual activity.  Eating habits.  Work and work Statistician.  Method of birth control.  Menstrual cycle.  Pregnancy history.  Screening You may have the  following tests or measurements:  Height, weight, and BMI.  Blood pressure.  Lipid and cholesterol levels. These may be checked every 5 years, or more frequently if you are over 81 years old.  Skin check.  Lung cancer screening. You may have this screening every year starting at age 76 if you have a 30-pack-year history of smoking and currently smoke or have quit within the past 15 years.  Fecal occult blood test (FOBT) of the stool. You may have this test every year starting at age 49.  Flexible sigmoidoscopy or colonoscopy. You may have a sigmoidoscopy every 5 years or a colonoscopy every 10 years starting at age 31.  Hepatitis C blood test.  Hepatitis B blood test.  Sexually transmitted disease (STD) testing.  Diabetes screening. This is done by checking your blood sugar (glucose) after you have not eaten for a while (fasting). You may have this done every 1-3 years.  Mammogram. This may be done every 1-2 years. Talk to your health care provider about when you should start having regular mammograms. This may depend on whether you have a family history of breast cancer.  BRCA-related cancer screening. This may be done if you have a family history of breast, ovarian, tubal, or peritoneal cancers.  Pelvic exam and Pap test. This may be done every 3 years starting at age 87. Starting at age 62, this may be done every 5 years if you have a Pap test in combination with an HPV test.  Bone density scan. This is done to screen for osteoporosis. You may have this scan if you are at high risk for  osteoporosis.  Discuss your test results, treatment options, and if necessary, the need for more tests with your health care provider. Vaccines Your health care provider may recommend certain vaccines, such as:  Influenza vaccine. This is recommended every year.  Tetanus, diphtheria, and acellular pertussis (Tdap, Td) vaccine. You may need a Td booster every 10 years.  Varicella vaccine. You  may need this if you have not been vaccinated.  Zoster vaccine. You may need this after age 45.  Measles, mumps, and rubella (MMR) vaccine. You may need at least one dose of MMR if you were born in 1957 or later. You may also need a second dose.  Pneumococcal 13-valent conjugate (PCV13) vaccine. You may need this if you have certain conditions and were not previously vaccinated.  Pneumococcal polysaccharide (PPSV23) vaccine. You may need one or two doses if you smoke cigarettes or if you have certain conditions.  Meningococcal vaccine. You may need this if you have certain conditions.  Hepatitis A vaccine. You may need this if you have certain conditions or if you travel or work in places where you may be exposed to hepatitis A.  Hepatitis B vaccine. You may need this if you have certain conditions or if you travel or work in places where you may be exposed to hepatitis B.  Haemophilus influenzae type b (Hib) vaccine. You may need this if you have certain conditions.  Talk to your health care provider about which screenings and vaccines you need and how often you need them. This information is not intended to replace advice given to you by your health care provider. Make sure you discuss any questions you have with your health care provider. Document Released: 06/21/2015 Document Revised: 02/12/2016 Document Reviewed: 03/26/2015 Elsevier Interactive Patient Education  [employer].

## 2017-07-05 NOTE — Progress Notes (Signed)
Subjective:    Patient ID: Alisha Stephenson, female    DOB: December 11, 1965, 52 y.o.   MRN: [id number]  HPI  Pt in for CPE.  Pt is fasting.  Pt declines flu vaccine today.  Pt states had colonoscopy. Pt state 8 years ago negative colonoscopy to repeat in about 10 years per pt.  Diabetic eye exam with [facility] this summer in [place].   October 2018 pt had mammogram.      Review of Systems  Constitutional: Negative for chills, fatigue and fever.  HENT: Positive for congestion. Negative for ear discharge, mouth sores and postnasal drip.        Post nasal drainage. X 2days.  Respiratory: Negative for cough, chest tightness, shortness of breath and wheezing.   Cardiovascular: Negative for chest pain and palpitations.  Gastrointestinal: Negative for abdominal distention, abdominal pain, blood in stool, constipation, diarrhea, rectal pain and vomiting.  Endocrine: Negative for polydipsia, polyphagia and polyuria.  Genitourinary: Negative for decreased urine volume, difficulty urinating, dyspareunia, enuresis, flank pain, frequency and menstrual problem.       Some urge incontinence. When has to urinate can't hold for very long at all. Present for 3-4 months.  Musculoskeletal: Negative for arthralgias, back pain, joint swelling, myalgias, neck pain and neck stiffness.       Pt has been struggling with ankle pain. Struggling post surgery. Went to PT and suffered a strain. Pt is wearing a boot now.  Skin: Negative for color change and rash.       Healed surgical wounds now per pt.  Neurological: Negative for dizziness, speech difficulty, weakness, light-headedness and numbness.  Hematological: Negative for adenopathy. Does not bruise/bleed easily.  Psychiatric/Behavioral: Negative for behavioral problems, decreased concentration and dysphoric mood.    Past Medical History:  Diagnosis Date  . Asthma   . Diabetes mellitus without complication ([place])   . Diverticulitis   . Ovarian  cyst      Social History   Socioeconomic History  . Marital status: Married    Spouse name: Not on file  . Number of children: Not on file  . Years of education: Not on file  . Highest education level: Not on file  Social Needs  . Financial resource strain: Not on file  . Food insecurity - worry: Not on file  . Food insecurity - inability: Not on file  . Transportation needs - medical: Not on file  . Transportation needs - non-medical: Not on file  Occupational History  . Not on file  Tobacco Use  . Smoking status: Former Research scientist (life sciences)  . Smokeless tobacco: Never Used  Substance and Sexual Activity  . Alcohol use: No    Comment: rarely  . Drug use: No  . Sexual activity: No  Other Topics Concern  . Not on file  Social History Narrative  . Not on file    Past Surgical History:  Procedure Laterality Date  . ABDOMINAL HYSTERECTOMY    . CESAREAN SECTION    . CHOLECYSTECTOMY      Family History  Problem Relation Age of Onset  . Diabetes Father   . Heart disease Father     Allergies  Allergen Reactions  . Sulfa Antibiotics Rash and Other (See Comments)    Current Outpatient Medications on File Prior to Visit  Medication Sig Dispense Refill  . albuterol (PROVENTIL HFA;VENTOLIN HFA) 108 (90 Base) MCG/ACT inhaler Inhale 2 puffs into the lungs every 6 (six) hours as needed for wheezing or  shortness of breath. 1 Inhaler 2  . dicyclomine (BENTYL) 20 MG tablet Take by mouth. Take 1 tablet by mouth every 6 hours as needed.    . enoxaparin (LOVENOX) 40 MG/0.4ML injection Inject 0.4 mLs (40 mg total) into the skin daily. 14 Syringe 0  . enoxaparin (LOVENOX) 40 MG/0.4ML injection Inject 0.4 mLs (40 mg total) into the skin daily. 14 Syringe 0  . fluticasone (FLONASE) 50 MCG/ACT nasal spray Place 2 sprays into both nostrils daily. 16 g 1  . ibuprofen (ADVIL,MOTRIN) 600 MG tablet Take 1 tablet (600 mg total) by mouth every 8 (eight) hours as needed. 30 tablet 0  . mupirocin ointment  (BACTROBAN) 2 % Apply to area twice daily 22 g 0  . ondansetron (ZOFRAN ODT) 8 MG disintegrating tablet Take 1 tablet (8 mg total) by mouth every 8 (eight) hours as needed for nausea or vomiting. 12 tablet 0  . promethazine (PHENERGAN) 25 MG tablet Take 1 tablet (25 mg total) by mouth every 8 (eight) hours as needed for nausea or vomiting. 20 tablet 0  . VICTOZA 18 MG/3ML SOPN INJECT 1.8 MG INTO THE SKIN ONCE DAILY 18 mL 0   No current facility-administered medications on file prior to visit.     BP (!) 114/46   Pulse 84   Temp 97.9 F (36.6 C) (Oral)   Resp 16   SpO2 99%        Objective:   Physical Exam   General Mental Status- Alert. General Appearance- Not in acute distress.   Skin General: Color- Normal Color. Moisture- Normal Moisture.  Neck Carotid Arteries- Normal color. Moisture- Normal Moisture. No carotid bruits. No JVD.  Chest and Lung Exam Auscultation: Breath Sounds:-Normal.  Cardiovascular                                        Auscultation:Rythm- Regular, rate and rythm                                             Murmurs & Other Heart Sounds:Auscultation of the heart reveals- No Murmurs.  Abdomen Inspection:-Inspeection Normal. Palpation/Percussion:Note:No mass. Palpation and Percussion of the abdomen reveal- Non Tender, Non Distended + BS, no rebound or guarding.    Neurologic Cranial Nerve exam:- CN III-XII intact(No nystagmus), symmetric smile. Strength:- 5/5 equal and symmetric strength both upper and lower extremities.  Lower ext- see quality metrics.     Assessment & Plan:  For you wellness exam today I have ordered cbc, cmp, lipid panel, ua and hiv.  Vaccine declined both flu vaccine and pneumonia vaccine.  Recommend exercise and healthy diet.  We will let you know lab results as they come in.  Follow up date appointment will be determined after lab review.   For nasal congestion reported today recommend Flonase.  You to follow-up  sinus pressure/infectious signs and symptoms notify us and consider antibiotic at that point.   You mentioned some urinary incontinence recently at times today.  Check your urine for infection.  But also prescribed Ditropan.

## 2017-07-06 ENCOUNTER — Ambulatory Visit (INDEPENDENT_AMBULATORY_CARE_PROVIDER_SITE_OTHER): Payer: 59 | Admitting: Podiatry

## 2017-07-06 DIAGNOSIS — R609 Edema, unspecified: Secondary | ICD-10-CM

## 2017-07-06 DIAGNOSIS — M25572 Pain in left ankle and joints of left foot: Secondary | ICD-10-CM | POA: Diagnosis not present

## 2017-07-06 DIAGNOSIS — M25672 Stiffness of left ankle, not elsewhere classified: Secondary | ICD-10-CM | POA: Diagnosis not present

## 2017-07-06 DIAGNOSIS — M25472 Effusion, left ankle: Secondary | ICD-10-CM | POA: Diagnosis not present

## 2017-07-06 LAB — HIV ANTIBODY (ROUTINE TESTING W REFLEX): HIV: NONREACTIVE

## 2017-07-06 NOTE — Progress Notes (Signed)
Ms. Sarratt presents to physical therapy today.  She states that overall her foot is feeling better and that she would do better with therapy and I came over to see her today.  She still has had some swelling.  Ibuprofen is been helping quite a bit.  She is asking about an Haematologist.  When she finished physical therapy we did lay some Unna boot on her left lower extremity today.  Cautions were given to cut it off if it were to swell or cause increased pain.  On evaluation today with her and the physical therapist she had decreased pain on the course of the posterior tibial tendon and flexor tendon as well as the peroneal tendons.  The Achilles tendon appears to be intact and there is no pain to the Achilles tendon.  There is no pain in the gastrocnemius recession site.  Incision appears to be healed at this time and there is no scab or callus formation and there is a scar that is well formed.  There is no surrounding erythema to the incision there is no drainage or pus or any other clinical signs of infection.  She will return to physical therapy on Thursday and they are going to cut the Unna boot off and then she will come into the office to have a new one will be applied.  I will see her as scheduled on Tuesday or sooner if needed.  Trula Slade DPM

## 2017-07-08 DIAGNOSIS — M25672 Stiffness of left ankle, not elsewhere classified: Secondary | ICD-10-CM | POA: Diagnosis not present

## 2017-07-08 DIAGNOSIS — M25472 Effusion, left ankle: Secondary | ICD-10-CM | POA: Diagnosis not present

## 2017-07-08 DIAGNOSIS — M25572 Pain in left ankle and joints of left foot: Secondary | ICD-10-CM | POA: Diagnosis not present

## 2017-07-13 ENCOUNTER — Ambulatory Visit (INDEPENDENT_AMBULATORY_CARE_PROVIDER_SITE_OTHER): Payer: 59 | Admitting: Podiatry

## 2017-07-13 DIAGNOSIS — M7662 Achilles tendinitis, left leg: Secondary | ICD-10-CM

## 2017-07-13 DIAGNOSIS — M25672 Stiffness of left ankle, not elsewhere classified: Secondary | ICD-10-CM | POA: Diagnosis not present

## 2017-07-13 DIAGNOSIS — M25572 Pain in left ankle and joints of left foot: Secondary | ICD-10-CM | POA: Diagnosis not present

## 2017-07-13 DIAGNOSIS — M25472 Effusion, left ankle: Secondary | ICD-10-CM | POA: Diagnosis not present

## 2017-07-14 NOTE — Progress Notes (Signed)
Subjective: Alisha Stephenson presents the office today for follow-up evaluation status post heel spur resection, Achilles Tina lysis and gastrocnemius recession.  She states that overall her foot is doing much better.  She has been on physical therapy.  She gets some pain along the ankle tendon but she had at her last appointment but this is doing much better.  She states that the [employer] helps quite a bit.  She has been walking in the cam boot.  She still uses the crutches some for some assistance but otherwise she does go just walking in the boot.  She is even gone barefoot at home for couple steps and had no pain.  She states the incision is healed well there is a scar over the entire area there is been no opening or drainage.  She has had no recent injury or trauma.  She has no other concerns today. Denies any systemic complaints such as fevers, chills, nausea, vomiting. No acute changes since last appointment, and no other complaints at this time.   Objective: AAO x3, NAD DP/PT pulses palpable bilaterally, CRT less than 3 seconds Incision appears to be healed and the scar is well formed.  There is no opening.  There is no drainage or surrounding erythema.  There is minimal edema to the surgical site.  Achilles tendon appears to be intact.  Mild discomfort along the course of the flexor tendons and peroneal tendons but this appears to be much improved and I last saw her.  Mild edema to this area as well but no erythema or increase in warmth.  There is no area pinpoint bony tenderness or pain to vibratory sensation. She presents today in a cam boot with crutches for assistance There is no pain with calf compression.  No signs of DVT.  No clinical signs of infection. No open lesions or pre-ulcerative lesions.   Assessment: Healed surgical site with secondary tendinitis likely due to nonuse after starting physical therapy  Plan: -All treatment options discussed with the patient including all alternatives,  risks, complications.  -She states that she is having no swelling today she wants to hold off on another Unna boot but she states this helped quite a bit.  To continue the cam boot.  He can start to transition to weightbearing in the cam boot full-time without crutches or assistance if able.  Continue with physical therapy to allow for transition.  Over the next couple weeks we can start to transition to regular shoe.  She is made good progress since I last saw her.  Continue anti-inflammatories as needed.  Ice elevation continue with compression sock as well. -Follow-up in 2 weeks or sooner if needed.  Call any questions or concerns meantime.  Trula Slade DPM

## 2017-07-15 ENCOUNTER — Other Ambulatory Visit: Payer: 59

## 2017-07-16 ENCOUNTER — Other Ambulatory Visit: Payer: Self-pay | Admitting: Medical

## 2017-07-16 MED FILL — VICTOZA 18 MG/3 ML INJECT P: 18 | 60 days supply | Qty: 18 | Fill #0

## 2017-07-19 MED FILL — ONDANSETRON ODT 4 MG TABLET: 4 | 10 days supply | Qty: 30 | Fill #1

## 2017-07-27 DIAGNOSIS — M25672 Stiffness of left ankle, not elsewhere classified: Secondary | ICD-10-CM | POA: Diagnosis not present

## 2017-07-27 DIAGNOSIS — M25572 Pain in left ankle and joints of left foot: Secondary | ICD-10-CM | POA: Diagnosis not present

## 2017-07-27 DIAGNOSIS — M25472 Effusion, left ankle: Secondary | ICD-10-CM | POA: Diagnosis not present

## 2017-07-29 ENCOUNTER — Encounter: Payer: Self-pay | Admitting: Podiatry

## 2017-07-29 ENCOUNTER — Ambulatory Visit (INDEPENDENT_AMBULATORY_CARE_PROVIDER_SITE_OTHER): Payer: 59 | Admitting: Podiatry

## 2017-07-29 ENCOUNTER — Encounter: Payer: Self-pay | Admitting: Medical

## 2017-07-29 ENCOUNTER — Ambulatory Visit (INDEPENDENT_AMBULATORY_CARE_PROVIDER_SITE_OTHER): Payer: 59 | Admitting: Medical

## 2017-07-29 VITALS — BP 135/65 | HR 64 | Temp 98.1°F | Resp 16

## 2017-07-29 DIAGNOSIS — M25672 Stiffness of left ankle, not elsewhere classified: Secondary | ICD-10-CM | POA: Diagnosis not present

## 2017-07-29 DIAGNOSIS — N390 Urinary tract infection, site not specified: Secondary | ICD-10-CM

## 2017-07-29 DIAGNOSIS — M7662 Achilles tendinitis, left leg: Secondary | ICD-10-CM

## 2017-07-29 DIAGNOSIS — R319 Hematuria, unspecified: Secondary | ICD-10-CM | POA: Diagnosis not present

## 2017-07-29 DIAGNOSIS — R300 Dysuria: Secondary | ICD-10-CM | POA: Diagnosis not present

## 2017-07-29 DIAGNOSIS — M25472 Effusion, left ankle: Secondary | ICD-10-CM | POA: Diagnosis not present

## 2017-07-29 DIAGNOSIS — M25572 Pain in left ankle and joints of left foot: Secondary | ICD-10-CM | POA: Diagnosis not present

## 2017-07-29 LAB — POC URINALSYSI DIPSTICK (AUTOMATED)
Bilirubin, UA: NEGATIVE
Glucose, UA: NEGATIVE
KETONES UA: NEGATIVE
LEUKOCYTES UA: NEGATIVE
Nitrite, UA: NEGATIVE
PROTEIN UA: NEGATIVE
Spec Grav, UA: >=1.03 — AB (ref 1.010–1.025)
Urobilinogen, UA: NEGATIVE E.U./dL — AB
pH, UA: 6 (ref 5.0–8.0)

## 2017-07-29 MED ORDER — NITROFURANTOIN MONOHYD MACRO 100 MG PO CAPS: 100.0000 mg | ORAL_CAPSULE | Freq: Two times a day (BID) | ORAL | 0 refills | Status: DC

## 2017-07-29 MED ORDER — PHENAZOPYRIDINE HCL 200 MG PO TABS: 200.0000 mg | ORAL_TABLET | Freq: Three times a day (TID) | ORAL | 0 refills | Status: DC | PRN

## 2017-07-29 MED FILL — PHENAZOPYRIDINE 200 MG TAB: 200 | 3 days supply | Qty: 10 | Fill #0

## 2017-07-29 MED FILL — NITROFURANTOIN MONO-MCR 100: 100 | 7 days supply | Qty: 14 | Fill #0

## 2017-07-29 NOTE — Progress Notes (Signed)
Subjective:    Patient ID: Alisha Stephenson, female    DOB: January 31, 1966, 52 y.o.   MRN: [id number]  HPI  Pt in with uti symptoms since this morning.    Dysuria- yes Frequent urination-yes and one episode of incontinence. Hesitancy-yes Suprapubic pressure-yes Fever-no chills-yes Nausea-yes Vomiting-no CVA pain-Mild rt cva tenderness. History of UTI- occasional uti in the past. Gross hematuria-no   Review of Systems  Constitutional: Negative for chills, fatigue and fever.  Respiratory: Negative for cough, chest tightness, shortness of breath and wheezing.   Cardiovascular: Negative for chest pain and palpitations.  Gastrointestinal: Negative for abdominal pain.  Genitourinary: Positive for dysuria, frequency and urgency. Negative for hematuria, pelvic pain and vaginal pain.  Musculoskeletal: Positive for back pain.  Skin: Negative for rash.  Neurological: Negative for dizziness, tremors, seizures, speech difficulty, weakness, light-headedness and numbness.  Hematological: Negative for adenopathy. Does not bruise/bleed easily.  Psychiatric/Behavioral: Negative for behavioral problems, confusion, decreased concentration and dysphoric mood.   Past Medical History:  Diagnosis Date  . Asthma   . Diabetes mellitus without complication ([place])   . Diverticulitis   . Ovarian cyst      Social History   Socioeconomic History  . Marital status: Married    Spouse name: Not on file  . Number of children: Not on file  . Years of education: Not on file  . Highest education level: Not on file  Social Needs  . Financial resource strain: Not on file  . Food insecurity - worry: Not on file  . Food insecurity - inability: Not on file  . Transportation needs - medical: Not on file  . Transportation needs - non-medical: Not on file  Occupational History  . Not on file  Tobacco Use  . Smoking status: Former Research scientist (life sciences)  . Smokeless tobacco: Never Used  Substance and Sexual Activity  .  Alcohol use: No    Comment: rarely  . Drug use: No  . Sexual activity: No  Other Topics Concern  . Not on file  Social History Narrative  . Not on file    Past Surgical History:  Procedure Laterality Date  . ABDOMINAL HYSTERECTOMY    . CESAREAN SECTION    . CHOLECYSTECTOMY      Family History  Problem Relation Age of Onset  . Diabetes Father   . Heart disease Father     Allergies  Allergen Reactions  . Sulfa Antibiotics Rash and Other (See Comments)    Current Outpatient Medications on File Prior to Visit  Medication Sig Dispense Refill  . albuterol (PROVENTIL HFA;VENTOLIN HFA) 108 (90 Base) MCG/ACT inhaler Inhale 2 puffs into the lungs every 6 (six) hours as needed for wheezing or shortness of breath. 1 Inhaler 2  . dicyclomine (BENTYL) 20 MG tablet Take by mouth. Take 1 tablet by mouth every 6 hours as needed.    . enoxaparin (LOVENOX) 40 MG/0.4ML injection Inject 0.4 mLs (40 mg total) into the skin daily. 14 Syringe 0  . enoxaparin (LOVENOX) 40 MG/0.4ML injection Inject 0.4 mLs (40 mg total) into the skin daily. 14 Syringe 0  . fluticasone (FLONASE) 50 MCG/ACT nasal spray Place 2 sprays into both nostrils daily. 16 g 1  . fluticasone (FLONASE) 50 MCG/ACT nasal spray Place 2 sprays into both nostrils daily. 16 g 1  . ibuprofen (ADVIL,MOTRIN) 600 MG tablet Take 1 tablet (600 mg total) by mouth every 8 (eight) hours as needed. 30 tablet 0  . mupirocin ointment (BACTROBAN) 2 %  Apply to area twice daily 22 g 0  . ondansetron (ZOFRAN ODT) 8 MG disintegrating tablet Take 1 tablet (8 mg total) by mouth every 8 (eight) hours as needed for nausea or vomiting. 12 tablet 0  . oxybutynin (DITROPAN-XL) 5 MG 24 hr tablet Take 1 tablet (5 mg total) by mouth at bedtime. 30 tablet 1  . promethazine (PHENERGAN) 25 MG tablet Take 1 tablet (25 mg total) by mouth every 8 (eight) hours as needed for nausea or vomiting. 20 tablet 0  . VICTOZA 18 MG/3ML SOPN INJECT 1.8 MG INTO THE SKIN ONCE DAILY  18 mL 0   No current facility-administered medications on file prior to visit.     BP 135/65   Pulse 64   Temp 98.1 F (36.7 C) (Oral)   Resp 16   SpO2 99%       Objective:   Physical Exam  General Appearance- Not in acute distress.  HEENT Eyes- Scleraeral/Conjuntiva-bilat- Not Yellow. Mouth & Throat- Normal.  Chest and Lung Exam Auscultation: Breath sounds:-Normal. Adventitious sounds:- No Adventitious sounds.  Cardiovascular Auscultation:Rythm - Regular. Heart Sounds -Normal heart sounds.  Abdomen Inspection:-Inspection Normal.  Palpation/Perucssion: Palpation and Percussion of the abdomen reveal- Non Tender suprapubic area in past, No Rebound tenderness, No rigidity(Guarding) and No Palpable abdominal masses.  Liver:-Normal.  Spleen:- Normal.   Back-faint rt cva tenderness.      Assessment & Plan:  You appear to have a urinary tract infection. I am prescribing macrobid antibiotic for the probable infection. Hydrate well. I am sending out a urine culture. During the interim if your signs and symptoms worsen rather than improving please notify us. We will notify your when the culture results are back.  For urinary pain rx pyridium  If pain changes/worsen notify us. Blood in urine likely associated with uti. But do want you to repeat Urine in 10-14 days to confirm no blood present.  Follow up in 10 days or as needed.  Mackie Pai, PA-C

## 2017-07-29 NOTE — Addendum Note (Signed)
Addended by: Hinton Dyer on: 07/29/2017 09:39 AM   Modules accepted: Orders

## 2017-07-29 NOTE — Patient Instructions (Signed)
You appear to have a urinary tract infection. I am prescribing macrobid antibiotic for the probable infection. Hydrate well. I am sending out a urine culture. During the interim if your signs and symptoms worsen rather than improving please notify us. We will notify your when the culture results are back.  For urinary pain rx pyridium  If pain changes/worsen notify us. Blood in urine likely associated with uti. But do want you to repeat Urine in 10-14 days to confirm no blood present.  Follow up in 10 days or as needed.

## 2017-07-30 LAB — URINE CULTURE
MICRO NUMBER:: 90230608
SPECIMEN QUALITY: ADEQUATE

## 2017-07-31 ENCOUNTER — Telehealth: Payer: Self-pay | Admitting: Medical

## 2017-07-31 DIAGNOSIS — R319 Hematuria, unspecified: Secondary | ICD-10-CM

## 2017-07-31 NOTE — Telephone Encounter (Signed)
Future order urine place.

## 2017-08-01 NOTE — Progress Notes (Signed)
Subjective: Alisha Stephenson presents the office today for follow-up evaluation status post heel spur resection, Achilles tenolysis and gastrocnemius recession.  I did see her when she is in physical therapy on Tuesday and she was doing well.  Incision is well-healed and I discussed with her that she can start to transition to a regular shoe gradually.  She has a regular shoe around the house and short activities and wear the cam boot while working but she can end up in the cam boot.  Over the last 2 days she has been doing this and she states that she feels well.  Feels weak and she has some occasional discomfort but overall she is doing much better.  She also states the incision remains well healed with a scar and she is having no issues to the incision. Denies any systemic complaints such as fevers, chills, nausea, vomiting. No acute changes since last appointment, and no other complaints at this time.   Objective: AAO x3, NAD DP/PT pulses palpable bilaterally, CRT less than 3 seconds Incision appears to be healed and the scar is well formed.  There is no opening.  There is no surrounding erythema, ascending sialitis.  There is no clinical signs of infection.  There is only a small amount of tenderness to palpation of the distal portion of the incision along the posterior calcaneus.  There is no pain with lateral compression of the calcaneus.  There is no significant discomfort on the course of the flexor tendons or peroneal tendons.  She states that the front of her leg and she points to the extensor tendon of her leg occasionally get discomfort but currently does not express any pain.  There is minimal edema there is no erythema or increase in warmth.  I feel she is actually doing much better today. There is no pain with calf compression.  No signs of DVT.  No clinical signs of infection. No open lesions or pre-ulcerative lesions.       Assessment: Healed surgical site with resolving secondary  tendinitis  Plan: -All treatment options discussed with the patient including all alternatives, risks, complications.  -She is continue with physical therapy and this is been doing much better for her.  I want her to start to transition to regular shoe as tolerated but make this a gradual transition.  I want her to work on this over the next couple weeks.  Continue ice and elevation as well as compression socks.  If there is any increase in pain to return the cam boot and to let me know.  She agrees with this plan she has no further questions or concerns today.  Follow-up in 3 weeks or sooner if needed.  I will try to see her when she is in physical therapy until then.  Trula Slade DPM

## 2017-08-05 DIAGNOSIS — M25672 Stiffness of left ankle, not elsewhere classified: Secondary | ICD-10-CM | POA: Diagnosis not present

## 2017-08-05 DIAGNOSIS — M25572 Pain in left ankle and joints of left foot: Secondary | ICD-10-CM | POA: Diagnosis not present

## 2017-08-05 DIAGNOSIS — M25472 Effusion, left ankle: Secondary | ICD-10-CM | POA: Diagnosis not present

## 2017-08-19 ENCOUNTER — Ambulatory Visit: Payer: 59 | Admitting: Podiatry

## 2017-08-24 DIAGNOSIS — M25572 Pain in left ankle and joints of left foot: Secondary | ICD-10-CM | POA: Diagnosis not present

## 2017-08-24 DIAGNOSIS — M25472 Effusion, left ankle: Secondary | ICD-10-CM | POA: Diagnosis not present

## 2017-08-24 DIAGNOSIS — M25672 Stiffness of left ankle, not elsewhere classified: Secondary | ICD-10-CM | POA: Diagnosis not present

## 2017-08-26 ENCOUNTER — Encounter: Payer: Self-pay | Admitting: Podiatry

## 2017-08-26 ENCOUNTER — Ambulatory Visit (INDEPENDENT_AMBULATORY_CARE_PROVIDER_SITE_OTHER): Payer: 59 | Admitting: Podiatry

## 2017-08-26 DIAGNOSIS — M25472 Effusion, left ankle: Secondary | ICD-10-CM | POA: Diagnosis not present

## 2017-08-26 DIAGNOSIS — M25672 Stiffness of left ankle, not elsewhere classified: Secondary | ICD-10-CM | POA: Diagnosis not present

## 2017-08-26 DIAGNOSIS — M7662 Achilles tendinitis, left leg: Secondary | ICD-10-CM

## 2017-08-26 DIAGNOSIS — M779 Enthesopathy, unspecified: Secondary | ICD-10-CM

## 2017-08-26 DIAGNOSIS — M25572 Pain in left ankle and joints of left foot: Secondary | ICD-10-CM | POA: Diagnosis not present

## 2017-08-26 MED ORDER — MELOXICAM 15 MG PO TABS: 15.0000 mg | ORAL_TABLET | Freq: Every day | ORAL | 2 refills | Status: DC

## 2017-08-26 NOTE — Progress Notes (Signed)
Subjective: Alisha Stephenson presents the office today for follow-up evaluation status post heel spur resection, Achilles tenolysis and gastrocnemius recession.  She has been continue physical therapy.  She states that she has some pain but she points mostly to the bottom of the heel as well as the side of the ankle.  She still has some swelling occasionally but this is improved compared to what it has been.  She is interested about doing iontophoresis or dry needling for the Achilles tendon.  She denies any recent injury.  She has been wearing a regular shoe which she has been working full-time. Denies any systemic complaints such as fevers, chills, nausea, vomiting. No acute changes since last appointment, and no other complaints at this time.   Objective: AAO x3, NAD DP/PT pulses palpable bilaterally, CRT less than 3 seconds Incision appears to be healed and the scar is well formed.  There is no surrounding erythema, ascending cellulitis there is no clinical signs of infection noted today.  Thompson test is negative.  No significant discomfort along the course of the Achilles tendon.  Mild discomfort on the plantar medial tubercle of the calcaneus at the insertion of the plantar fascia.  Mild discomfort on the flexor tendons.  Overall the flexor tendons, plantar fascia appears to be intact.  Mild edema but there is no erythema or increased warmth of the foot.  No other areas of tenderness identified today. There is no pain with calf compression.  No signs of DVT.  No clinical signs of infection. No open lesions or pre-ulcerative lesions.    Assessment: Healed surgical site with resolving secondary tendinitis  Plan: -All treatment options discussed with the patient including all alternatives, risks, complications.  -She continues to improve on the surgical site and I think that some of her tinnitus is more from compensation.  Prescribed meloxicam to help with inflammation.  We will continue physical therapy  we discussed either dry needling or iontophoresis but will discuss with physical therapy.  Continue with supportive shoes as well as an insert inside of her shoes.  Ice to the area daily as well.  She declined steroid injection  Trula Slade DPM

## 2017-08-30 DIAGNOSIS — M25472 Effusion, left ankle: Secondary | ICD-10-CM | POA: Diagnosis not present

## 2017-08-30 DIAGNOSIS — M25572 Pain in left ankle and joints of left foot: Secondary | ICD-10-CM | POA: Diagnosis not present

## 2017-08-30 DIAGNOSIS — M25672 Stiffness of left ankle, not elsewhere classified: Secondary | ICD-10-CM | POA: Diagnosis not present

## 2017-09-06 ENCOUNTER — Encounter: Payer: Self-pay | Admitting: Podiatry

## 2017-09-06 ENCOUNTER — Ambulatory Visit (INDEPENDENT_AMBULATORY_CARE_PROVIDER_SITE_OTHER): Payer: 59 | Admitting: Podiatry

## 2017-09-06 DIAGNOSIS — M779 Enthesopathy, unspecified: Secondary | ICD-10-CM

## 2017-09-06 DIAGNOSIS — M7662 Achilles tendinitis, left leg: Secondary | ICD-10-CM

## 2017-09-07 NOTE — Progress Notes (Signed)
Subjective: Alisha Stephenson presents the office today for follow-up evaluation status post heel spur resection, Achilles tenolysis and gastrocnemius recession.  She states that she has been doing well however she feels that physical therapy is making her go backwards.  She states that when she goes to therapy to try to over stretch her Achilles tendon she gets pain afterwards.  When she is not going to therapy and she is doing home physical therapy she does well.  Because of this she wishes to discontinue physical therapy.  She has been able to wear regular shoe however told she is still causing discomfort but she can wear other types of shoes.  She denies any recent injury or falls and denies any increase in swelling or any redness recently. Denies any systemic complaints such as fevers, chills, nausea, vomiting. No acute changes since last appointment, and no other complaints at this time.   Objective: AAO x3, NAD DP/PT pulses palpable bilaterally, CRT less than 3 seconds Incision appears to be healed and the scar is well formed.  There is minimal tenderness to palpation along the gastrocnemius recession site.  There is no tenderness of the Achilles tendon.  Thompson test is negative.  No pain with lateral compression of the calcaneus.  Mild discomfort on the flexor tendons however this appears to be improved.  There is mild swelling but there is no erythema or increase in warmth.  There is no other area of tenderness identified at this time.  He does appear to be adequate range of motion of the ankle joint. There is no pain with calf compression.  No signs of DVT.  No clinical signs of infection. No open lesions or pre-ulcerative lesions.    Assessment: Healed surgical site with resolving secondary tendinitis  Plan: -All treatment options discussed with the patient including all alternatives, risks, complications.  -At this point we discussed holding off on physical therapy daily.  Continue with home  stretching, rehab exercises.  Continue with supportive shoes.  Anti-inflammatories as needed.  Ice to the area daily. -Follow-up in 3 weeks or sooner if needed.  Call any questions or concerns any change in symptoms.  Trula Slade DPM

## 2017-09-15 ENCOUNTER — Other Ambulatory Visit: Payer: Self-pay | Admitting: Medical

## 2017-09-16 MED FILL — VICTOZA 18 MG/3 ML INJECT P: 18 | 60 days supply | Qty: 18 | Fill #0

## 2017-09-17 ENCOUNTER — Encounter: Payer: Self-pay | Admitting: Podiatry

## 2017-09-17 ENCOUNTER — Ambulatory Visit: Payer: 59 | Admitting: Podiatry

## 2017-09-17 DIAGNOSIS — M7662 Achilles tendinitis, left leg: Secondary | ICD-10-CM

## 2017-09-17 DIAGNOSIS — M779 Enthesopathy, unspecified: Secondary | ICD-10-CM

## 2017-09-20 NOTE — Progress Notes (Signed)
Subjective: Anae presents the office today for follow-up evaluation status post heel spur resection, Achilles tenolysis and gastrocnemius recession.  She states that overall she is doing well her pain is much improved.  She did not like the furniture market she has some discomfort.  She actually presents today wearing a more open toed shoe healed shoe she states that she is doing well she like this not having significant pain.  Also her swelling is much improved.  Overall her swelling is more minimal.  Denies any redness or warmth and she is doing well.  She is been doing home rehab exercises.  Objective: AAO x3, NAD DP/PT pulses palpable bilaterally, CRT less than 3 seconds Incision appears to be healed and the scar is well formed.  Mild swelling to the surgical site but there is no significant tenderness palpation of the surgical site.  Minimal discomfort on the course of the peroneal tendon likely result of compensation.  There is no area pinpoint bony tenderness or pain to vibratory sensation.  No other areas of tenderness.  Thompson test is negative and Achilles tendon appears to be intact. There is no pain with calf compression.  No signs of DVT.  No clinical signs of infection. No open lesions or pre-ulcerative lesions.    Assessment: Healed surgical site with resolving secondary tendinitis both with improvement  Plan: -All treatment options discussed with the patient including all alternatives, risks, complications.  -She is doing well no symptoms continue to improve.  She is wearing more regular shoes.  Discussed gradual increase in activity level.  She can continue with the compression stocking to help with swelling.  At this point with discharge in the postoperative care she is doing well but I encouraged her to call any questions or concerns any change in symptoms but she agrees with this plan.  Trula Slade DPM

## 2017-11-02 DIAGNOSIS — R110 Nausea: Secondary | ICD-10-CM | POA: Diagnosis not present

## 2017-11-02 DIAGNOSIS — K581 Irritable bowel syndrome with constipation: Secondary | ICD-10-CM | POA: Diagnosis not present

## 2017-11-02 DIAGNOSIS — R1084 Generalized abdominal pain: Secondary | ICD-10-CM | POA: Diagnosis not present

## 2017-11-02 MED FILL — LINZESS 72 MCG CAPSULE: 72 | 30 days supply | Qty: 30 | Fill #0

## 2017-11-02 MED FILL — DICYCLOMINE 10 MG CAPSULE: 10 | 45 days supply | Qty: 90 | Fill #0

## 2017-11-02 MED FILL — ONDANSETRON HCL 4 MG TABLET: 4 | 10 days supply | Qty: 30 | Fill #0

## 2017-11-02 MED FILL — SM CLEARLAX POWDER: 30 days supply | Qty: 510 | Fill #0

## 2017-11-03 ENCOUNTER — Other Ambulatory Visit: Payer: Self-pay | Admitting: Medical

## 2017-11-03 MED FILL — VICTOZA 18 MG/3 ML INJECT P: 18 | 60 days supply | Qty: 18 | Fill #0

## 2017-11-16 ENCOUNTER — Ambulatory Visit: Payer: 59 | Admitting: Podiatry

## 2017-11-16 DIAGNOSIS — M779 Enthesopathy, unspecified: Secondary | ICD-10-CM | POA: Diagnosis not present

## 2017-11-16 DIAGNOSIS — M7662 Achilles tendinitis, left leg: Secondary | ICD-10-CM

## 2017-11-16 MED ORDER — MELOXICAM 15 MG PO TABS: 15.0000 mg | ORAL_TABLET | Freq: Every day | ORAL | 2 refills | Status: DC

## 2017-11-21 NOTE — Progress Notes (Signed)
Subjective: 52 year old female presents the office today for concerns of pain to the back of her left foot on the previous surgical site.  She states that over the weekend she is noticed increasing discomfort and she points on the Achilles tendon.  She is unable to wear a closed and she was given it rubbing and causing irritation.  She denies any recent injury or trauma.  She states that she has been doing a lot of walking but she does not recall twisting her ankle or foot.  No falls.  No redness associate with the swelling no increase in warmth.  No other concerns. Denies any systemic complaints such as fevers, chills, nausea, vomiting. No acute changes since last appointment, and no other complaints at this time.   Objective: AAO x3, NAD DP/PT pulses palpable bilaterally, CRT less than 3 seconds Scar from the prior surgery is well-healed there is no erythema or increase in warmth.  There is no open sores identified.  There is tenderness along the Achilles tendon but there is also some mild discomfort on the flexor tendons.  Overall the Achilles tendon appears to be intact and Thompson test is negative.  Flexor tendons are intact.  There is no area pinpoint bony tenderness or pain to vibratory sensation. No open lesions or pre-ulcerative lesions.  No pain with calf compression, swelling, warmth, erythema  Assessment: Left Achilles, flexor tendinitis  Plan: -All treatment options discussed with the patient including all alternatives, risks, complications.  -Ankle brace was dispensed for immobilization.  Ice to the area.  Prescribed meloxicam.  Limit activity.  I do think this is an overuse injury. If there is any worsening to call the office otherwise I will see her back in the next couple weeks as scheduled. -Patient encouraged to call the office with any questions, concerns, change in symptoms.   Trula Slade DPM

## 2017-11-30 ENCOUNTER — Ambulatory Visit: Payer: 59 | Admitting: Podiatry

## 2017-12-06 MED FILL — DICYCLOMINE 10 MG CAPSULE: 10 | 45 days supply | Qty: 90 | Fill #1

## 2018-01-03 ENCOUNTER — Other Ambulatory Visit: Payer: Self-pay | Admitting: Medical

## 2018-01-03 MED FILL — VICTOZA 18 MG/3 ML INJECT P: 18 | 60 days supply | Qty: 18 | Fill #0

## 2018-02-21 ENCOUNTER — Ambulatory Visit: Payer: Self-pay

## 2018-02-21 ENCOUNTER — Telehealth: Payer: Self-pay

## 2018-02-21 NOTE — Telephone Encounter (Signed)
Copied from [place] [id number]. Topic: Inquiry >> Feb 21, 2018 11:37 AM Conception Chancy, NT wrote: Reason for CRM: patient is calling and states her daughter sees Dr. Nani Ravens and she would like to switch to him so that they have the same doctor.

## 2018-02-21 NOTE — Telephone Encounter (Signed)
OK w me.  

## 2018-02-21 NOTE — Telephone Encounter (Signed)
Pt. Reports she has noticed her blood glucose going up over the past 2 months. Running consistently 280-300 different times of the day. Reports she is taking medications as ordered and adhering to diet. Requests to change providers, from [employer] to Dr. Nani Ravens. Triage spoke with Rod Holler who states providers need to be notified. Best contact number for pt. Is [phone]. Please advise pt. Answer Assessment - Initial Assessment Questions 1. BLOOD GLUCOSE: "What is your blood glucose level?"      285 after eating oatmeal 2. ONSET: "When did you check the blood glucose?"      1130 3. USUAL RANGE: "What is your glucose level usually?" (e.g., usual fasting morning value, usual evening value)      385 before bed 4. KETONES: "Do you check for ketones (urine or blood test strips)?" If yes, ask: "What does the test show now?"      No 5. TYPE 1 or 2:  "Do you know what type of diabetes you have?"  (e.g., Type 1, Type 2, Gestational; doesn't know)      Type 2  6. INSULIN: "Do you take insulin?" "What type of insulin(s) do you use? What is the mode of delivery? (syringe, pen (e.g., injection or  pump)?"      No 7. DIABETES PILLS: "Do you take any pills for your diabetes?" If yes, ask: "Have you missed taking any pills recently?"     No 8. OTHER SYMPTOMS: "Do you have any symptoms?" (e.g., fever, frequent urination, difficulty breathing, dizziness, weakness, vomiting)     Frequent urination and leg cramps 9. PREGNANCY: "Is there any chance you are pregnant?" "When was your last menstrual period?"     No  Protocols used: DIABETES - HIGH BLOOD SUGAR-A-AH

## 2018-02-21 NOTE — Telephone Encounter (Signed)
Please schedule pt TOC with Dr. Nani Ravens.

## 2018-02-21 NOTE — Telephone Encounter (Signed)
Ok with me 

## 2018-02-22 ENCOUNTER — Telehealth: Payer: Self-pay | Admitting: Gastroenterology

## 2018-02-22 NOTE — Telephone Encounter (Signed)
Absolutely She is most welcome to come in see [place]

## 2018-02-22 NOTE — Telephone Encounter (Signed)
Hi Dr. Lyndel Safe, this pt is looking to transfer her care from [place] at [facility] to you because she works right across the street from our [place] office and her PCP has recommended you. GI records from [facility] are in Epic for your review. Could you please advise if you would take her as patient? Thank you.

## 2018-02-22 NOTE — Telephone Encounter (Signed)
appt scheduled for 03/23/18

## 2018-02-24 ENCOUNTER — Encounter: Payer: Self-pay | Admitting: Gastroenterology

## 2018-02-28 MED FILL — SM CLEARLAX POWDER: 30 days supply | Qty: 510 | Fill #1

## 2018-02-28 NOTE — Telephone Encounter (Signed)
Appt scheduled on 03/08/18.

## 2018-03-03 ENCOUNTER — Other Ambulatory Visit: Payer: Self-pay | Admitting: Medical

## 2018-03-03 MED FILL — VICTOZA 18 MG/3 ML INJECT P: 18 | 30 days supply | Qty: 9 | Fill #0

## 2018-03-08 ENCOUNTER — Ambulatory Visit: Payer: 59 | Admitting: Gastroenterology

## 2018-03-08 ENCOUNTER — Encounter: Payer: Self-pay | Admitting: Gastroenterology

## 2018-03-08 ENCOUNTER — Other Ambulatory Visit: Payer: Self-pay | Admitting: Family Medicine

## 2018-03-08 VITALS — BP 136/88 | HR 98 | Ht 64.0 in | Wt 264.5 lb

## 2018-03-08 DIAGNOSIS — Z1211 Encounter for screening for malignant neoplasm of colon: Secondary | ICD-10-CM

## 2018-03-08 DIAGNOSIS — R1012 Left upper quadrant pain: Secondary | ICD-10-CM

## 2018-03-08 DIAGNOSIS — K760 Fatty (change of) liver, not elsewhere classified: Secondary | ICD-10-CM

## 2018-03-08 DIAGNOSIS — K581 Irritable bowel syndrome with constipation: Secondary | ICD-10-CM | POA: Diagnosis not present

## 2018-03-08 DIAGNOSIS — Z1231 Encounter for screening mammogram for malignant neoplasm of breast: Secondary | ICD-10-CM

## 2018-03-08 NOTE — Patient Instructions (Addendum)
If you are age 53 or older, your body mass index should be between 23-30. Your Body mass index is 45.4 kg/m. If this is out of the aforementioned range listed, please consider follow up with your Primary Care Provider.  If you are age 26 or younger, your body mass index should be between 19-25. Your Body mass index is 45.4 kg/m. If this is out of the aformentioned range listed, please consider follow up with your Primary Care Provider.   Please purchase the following medications over the counter and take as directed: Miralax 17 grams twice daily, if still with problems you can add 2 prunes a day.  Continue taking the following medication:  Bentyl   Increase your water intake.   Lose 6 lbs over the next 3 months.  You will be due for a recall colonoscopy in 06/2018. We will send you a reminder in the mail when it gets closer to that time.   Thank you,  Dr. Jackquline Denmark

## 2018-03-08 NOTE — Progress Notes (Signed)
Chief Complaint: abdo pain  Referring Provider:  Shelda Pal*      ASSESSMENT AND PLAN;   #1. LUQ pain- neg CT 2015 except for fatty liver.  Likely part of IBS.  #2. IBS with constipation (better with miralax).  #3. Colorectal cancer screening.  #4. Fatty liver with normal LFTs.  Plan: - Proceed with colonoscopy for further evaluation.  I discussed risks and benefits.  She will would like to wait until January.  Certainly, she would get it done earlier if she starts having any red flag symptoms. - Miralax 17g po bid. If still with problems, add prunes 2/day. - Bentyl 10mg  1-2 po prn. - Drink plenty of fluids. - Recommend gradual weight loss - aim is to reduce 6 pounds over the next 3 months.  She is to watch p.o. intake.  Stop fried foods.  Start walking 30 minutes 3 times a week.  Cut down and stop sodas. - Minimize all nonsteroidals. - She has appointment with Dr. Nani Ravens regarding new onset diabetes.  May benefit from metformin. - Follow-up after the colonoscopy.   HPI:    Alisha Stephenson is a 52 y.o. female  With LUQ pain, crampy, better with defecation. Dx with IBS with constipation - has been on Linzess- but had explosive diarrhea with all doses. Had had symptoms of constipation with pellet-like stools over the last 8 to 10 years. With bloating - better with gas relief Has been drinking water. Has lactose intol No NSAIDs. No melena or hematochezia. She denies having any significant upper GI symptoms including heartburn, dysphagia, odynophagia.  She would occasionally have nausea for which she takes Zofran on as-needed basis. Has gained weight recently.  CT from 05/2014 showed diffuse hepatic steatosis, mild sigmoid diverticulosis Past Medical History:  Diagnosis Date  . Asthma   . Diabetes mellitus without complication ([place])   . Diverticulitis   . Gallstones   . IBS (irritable bowel syndrome)   . Ovarian cyst     Past Surgical History:    Procedure Laterality Date  . ABDOMINAL HYSTERECTOMY    . CESAREAN SECTION    . CHOLECYSTECTOMY    . COLONOSCOPY     about 2011    Family History  Problem Relation Age of Onset  . Diabetes Father   . Heart disease Father   . Breast cancer Maternal Grandmother   . Breast cancer Maternal Aunt   . Colon cancer Neg Hx   . Esophageal cancer Neg Hx     Social History   Tobacco Use  . Smoking status: Former Research scientist (life sciences)  . Smokeless tobacco: Never Used  . Tobacco comment: quit over 10 years ago  Substance Use Topics  . Alcohol use: No    Comment: rarely  . Drug use: No    Current Outpatient Medications  Medication Sig Dispense Refill  . albuterol (PROVENTIL HFA;VENTOLIN HFA) 108 (90 Base) MCG/ACT inhaler Inhale 2 puffs into the lungs every 6 (six) hours as needed for wheezing or shortness of breath. 1 Inhaler 2  . dicyclomine (BENTYL) 20 MG tablet Take by mouth. Take 1 tablet by mouth every 6 hours as needed.    . fluticasone (FLONASE) 50 MCG/ACT nasal spray Place 2 sprays into both nostrils daily. (Patient taking differently: Place 2 sprays into both nostrils as needed. ) 16 g 1  . ibuprofen (ADVIL,MOTRIN) 600 MG tablet Take 1 tablet (600 mg total) by mouth every 8 (eight) hours as needed. 30 tablet 0  .  liraglutide (VICTOZA) 18 MG/3ML SOPN Inject 0.3 mLs (1.8 mg total) into the skin daily. 15 mL 0  . ondansetron (ZOFRAN ODT) 8 MG disintegrating tablet Take 1 tablet (8 mg total) by mouth every 8 (eight) hours as needed for nausea or vomiting. 12 tablet 0  . Simethicone (GAS-X PO) Take 2 tablets by mouth as needed (equate brand of gas relief).     No current facility-administered medications for this visit.     Allergies  Allergen Reactions  . Sulfa Antibiotics Rash and Other (See Comments)    Review of Systems:  Constitutional: Denies fever, chills, diaphoresis, appetite change and fatigue.  HEENT: Denies photophobia, eye pain, redness, hearing loss, ear pain, congestion, sore  throat, rhinorrhea, sneezing, mouth sores, neck pain, neck stiffness and tinnitus.   Respiratory: Denies SOB, DOE, cough, chest tightness,  and wheezing.   Cardiovascular: Denies chest pain, palpitations and leg swelling.  Genitourinary: Denies dysuria, urgency, frequency, hematuria, flank pain and difficulty urinating.  Musculoskeletal: Denies myalgias, back pain, joint swelling, arthralgias and gait problem.  Skin: No rash.  Neurological: Denies dizziness, seizures, syncope, weakness, light-headedness, numbness and headaches.  Hematological: Denies adenopathy. Easy bruising, personal or family bleeding history  Psychiatric/Behavioral: No anxiety or depression     Physical Exam:    BP 136/88   Pulse 98   Ht 5\' 4"  (1.626 m)   Wt 264 lb 8 oz (120 kg)   BMI 45.40 kg/m  Filed Weights   03/08/18 1347  Weight: 264 lb 8 oz (120 kg)   Constitutional:  Well-developed, in no acute distress. Psychiatric: Normal mood and affect. Behavior is normal. HEENT: Pupils normal.  Conjunctivae are normal. No scleral icterus. Neck supple.  Cardiovascular: Normal rate, regular rhythm. No edema Pulmonary/chest: Effort normal and breath sounds normal. No wheezing, rales or rhonchi. Abdominal: Soft, nondistended. Nontender. Bowel sounds active throughout. There are no masses palpable. No hepatomegaly. Rectal:  defered Neurological: Alert and oriented to person place and time. Skin: Skin is warm and dry. No rashes noted.  Data Reviewed: I have personally reviewed following labs and imaging studies  CBC: CBC Latest Ref Rng & Units 07/05/2017 06/02/2017 05/21/2017  WBC 4.0 - 10.5 K/uL 7.9 8.3 9.0  Hemoglobin 12.0 - 15.0 g/dL 14.4 14.2 15.1  Hematocrit 36.0 - 46.0 % 43.3 42.7 43.9  Platelets 150.0 - 400.0 K/uL 163.0 180 225    CMP: CMP Latest Ref Rng & Units 07/05/2017 12/16/2016 11/06/2015  Glucose 70 - 99 mg/dL 208(H) 259(H) 231(H)  BUN 6 - 23 mg/dL 15 16 15   Creatinine 0.40 - 1.20 mg/dL 0.75 0.78  0.73  Sodium 135 - 145 mEq/L 143 137 140  Potassium 3.5 - 5.1 mEq/L 4.1 4.1 4.8  Chloride 96 - 112 mEq/L 104 104 100  CO2 19 - 32 mEq/L 30 27 33(H)  Calcium 8.4 - 10.5 mg/dL 9.0 9.2 10.0  Total Protein 6.0 - 8.3 g/dL 6.9 6.6 7.0  Total Bilirubin 0.2 - 1.2 mg/dL 0.7 0.6 0.5  Alkaline Phos 39 - 117 U/L 94 92 91  AST 0 - 37 U/L 22 26 24   ALT 0 - 35 U/L 29 33 39(H)     Carmell Austria, MD 03/08/2018, 2:03 PM  Cc: Shelda Pal*

## 2018-03-11 MED FILL — DICYCLOMINE 10 MG CAPSULE: 10 | 45 days supply | Qty: 90 | Fill #2

## 2018-03-15 ENCOUNTER — Ambulatory Visit (HOSPITAL_BASED_OUTPATIENT_CLINIC_OR_DEPARTMENT_OTHER)
Admission: RE | Admit: 2018-03-15 | Discharge: 2018-03-15 | Disposition: A | Discharge status: Home / Self Care | Payer: 59 | Source: Ambulatory Visit | Attending: Family Medicine | Admitting: Family Medicine

## 2018-03-15 DIAGNOSIS — Z1231 Encounter for screening mammogram for malignant neoplasm of breast: Secondary | ICD-10-CM | POA: Insufficient documentation

## 2018-03-22 MED FILL — SM CLEARLAX POWDER: 30 days supply | Qty: 510 | Fill #2

## 2018-03-23 ENCOUNTER — Other Ambulatory Visit: Payer: Self-pay | Admitting: Family Medicine

## 2018-03-23 ENCOUNTER — Encounter: Payer: Self-pay | Admitting: Family Medicine

## 2018-03-23 ENCOUNTER — Ambulatory Visit: Payer: 59 | Admitting: Family Medicine

## 2018-03-23 VITALS — BP 120/80 | HR 92 | Temp 98.3°F | Ht 64.0 in | Wt 267.1 lb

## 2018-03-23 DIAGNOSIS — E119 Type 2 diabetes mellitus without complications: Secondary | ICD-10-CM | POA: Diagnosis not present

## 2018-03-23 DIAGNOSIS — L821 Other seborrheic keratosis: Secondary | ICD-10-CM | POA: Diagnosis not present

## 2018-03-23 DIAGNOSIS — L659 Nonscarring hair loss, unspecified: Secondary | ICD-10-CM | POA: Diagnosis not present

## 2018-03-23 MED ORDER — ALBUTEROL SULFATE HFA 108 (90 BASE) MCG/ACT IN AERS: 2.0000 | INHALATION_SPRAY | Freq: Four times a day (QID) | RESPIRATORY_TRACT | 2 refills | Status: AC | PRN

## 2018-03-23 MED ORDER — FLUTICASONE PROPIONATE 50 MCG/ACT NA SUSP: 2.0000 | Freq: Every day | NASAL | 1 refills | Status: DC

## 2018-03-23 MED FILL — VENTOLIN HFA 90 MCG INHALER: 108 (90 BAS | 25 days supply | Qty: 18 | Fill #0

## 2018-03-23 MED FILL — FLUTICASONE PROP 50 MCG SPR: 50 | 30 days supply | Qty: 16 | Fill #0

## 2018-03-23 NOTE — Progress Notes (Signed)
Chief Complaint  Patient presents with  . New Patient (Initial Visit)  . Hyperglycemia    Subjective: Patient is a 52 y.o. female here for transfer of care.  Hx of DM. Last a1c >6 mo ago, 6.9. Recently sugars have been in 200-300's. On Victoza.  Has never been on metformin.  Her diet is poor and she eats sugary items.  She just started walking 3 times a week at the suggestion of her gastroenterologist.  Her last eye exam was over one year ago.  She did not like her provider and is requesting referral to someone else.  She has never gotten the pneumonia vaccine.  She is not on a statin.  She noticed 3 skin lesions on the back of her right calf a couple months ago.  They are not changing but are scaly.  No pain, itching, drainage, erythema, or size changes.  She has noted hair thinning for several months.  Her daughter is a Haematologist and does her hair.  She is not putting any new products in her hair.  She tried taking biotin without relief.  She does have a history of iron deficiency.  ROS: Heart: Denies chest pain  Lungs: Denies SOB   Past Medical History:  Diagnosis Date  . Asthma   . Diabetes mellitus without complication ([place])   . Diverticulitis   . Gallstones   . IBS (irritable bowel syndrome)   . Ovarian cyst     Objective: BP 120/80 (BP Location: Left Arm, Patient Position: Sitting, Cuff Size: Large)   Pulse 92   Temp 98.3 F (36.8 C) (Oral)   Ht 5\' 4"  (1.626 m)   Wt 267 lb 2 oz (121.2 kg)   SpO2 97%   BMI 45.85 kg/m  General: Awake, appears stated age Skin: 3 raised and scaly/hyperpigmented lesions on the back of the R calf.  No erythema, drainage, fluctuance, tenderness to palpation, or irregular skin coloring: There is some anterior thinning of the hair without loss of hair follicles. HEENT: MMM, EOMi Heart: RRR, 1+ bilateral pitting edema Lungs: CTAB, no rales, wheezes or rhonchi. No accessory muscle use Psych: Age appropriate judgment and insight, normal  affect and mood  Assessment and Plan: Diabetes mellitus without complication ([place]) - Plan: Ambulatory referral to Ophthalmology, Hemoglobin A1c, Microalbumin / creatinine urine ratio, Comprehensive metabolic panel  Seborrheic keratoses  Hair thinning - Plan: CBC, IBC panel, Ferritin, TSH  1-check above, continue Victoza, will likely need to start metformin.  Given her history of IBS, will likely try extended release formulation first.  Will need to start statin if labs are good.  Counseled on diet and exercise.  Referred to a new ophthalmologist. 2-reassurance 3-check iron panel and thyroid, likely due to iron deficiency given her history. Follow-up in 3 months. The patient voiced understanding and agreement to the plan.  [place], DO 03/23/18  4:38 PM

## 2018-03-23 NOTE — Patient Instructions (Addendum)
1200 mg of Ca daily and at least 800 units of Vit D daily is recommended.   Aim to do some physical exertion for 150 minutes per week. This is typically divided into 5 days per week, 30 minutes per day. The activity should be enough to get your heart rate up. Anything is better than nothing if you have time constraints. Consider yoga and lifting weights.   Keep the diet clean.  Give [place] 2-3 business days to get the results of your labs back.    If you do not hear anything about your referral in the next 1-2 weeks, call our office and ask for an update.  Goal weight loss: 8-9 lbs by next visit Long term goal: 100 lbs  Healthy Eating Plan Many factors influence your heart health, including eating and exercise habits. Heart (coronary) risk increases with abnormal blood fat (lipid) levels. Heart-healthy meal planning includes limiting unhealthy fats, increasing healthy fats, and making other small dietary changes. This includes maintaining a healthy body weight to help keep lipid levels within a normal range.  WHAT IS MY PLAN?  Your health care provider recommends that you:  Drink a glass of water before meals to help with satiety.  Eat slowly.  An alternative to the water is to add Metamucil. This will help with satiety as well. It does contain calories, unlike water.  WHAT TYPES OF FAT SHOULD I CHOOSE?  Choose healthy fats more often. Choose monounsaturated and polyunsaturated fats, such as olive oil and canola oil, flaxseeds, walnuts, almonds, and seeds.  Eat more omega-3 fats. Good choices include salmon, mackerel, sardines, tuna, flaxseed oil, and ground flaxseeds. Aim to eat fish at least two times each week.  Avoid foods with partially hydrogenated oils in them. These contain trans fats. Examples of foods that contain trans fats are stick margarine, some tub margarines, cookies, crackers, and other baked goods. If you are going to avoid a fat, this is the one to avoid!  WHAT  GENERAL GUIDELINES DO I NEED TO FOLLOW?  Check food labels carefully to identify foods with trans fats. Avoid these types of options when possible.  Fill one half of your plate with vegetables and green salads. Eat 4-5 servings of vegetables per day. A serving of vegetables equals 1 cup of raw leafy vegetables,  cup of raw or cooked cut-up vegetables, or  cup of vegetable juice.  Fill one fourth of your plate with whole grains. Look for the word "whole" as the first word in the ingredient list.  Fill one fourth of your plate with lean protein foods.  Eat 4-5 servings of fruit per day. A serving of fruit equals one medium whole fruit,  cup of dried fruit,  cup of fresh, frozen, or canned fruit. Try to avoid fruits in cups/syrups as the sugar content can be high.  Eat more foods that contain soluble fiber. Examples of foods that contain this type of fiber are apples, broccoli, carrots, beans, peas, and barley. Aim to get 20-30 g of fiber per day.  Eat more home-cooked food and less restaurant, buffet, and fast food.  Limit or avoid alcohol.  Limit foods that are high in starch and sugar.  Avoid fried foods when able.  Cook foods by using methods other than frying. Baking, boiling, grilling, and broiling are all great options. Other fat-reducing suggestions include: ? Removing the skin from poultry. ? Removing all visible fats from meats. ? Skimming the fat off of stews, soups, and gravies  before serving them. ? Steaming vegetables in water or broth.  Lose weight if you are overweight. Losing just 5-10% of your initial body weight can help your overall health and prevent diseases such as diabetes and heart disease.  Increase your consumption of nuts, legumes, and seeds to 4-5 servings per week. One serving of dried beans or legumes equals  cup after being cooked, one serving of nuts equals 1 ounces, and one serving of seeds equals  ounce or 1 tablespoon.  WHAT ARE GOOD FOODS CAN I  EAT? Grains Grainy breads (try to find bread that is 3 g of fiber per slice or greater), oatmeal, light popcorn. Whole-grain cereals. Rice and pasta, including brown rice and those that are made with whole wheat. Edamame pasta is a great alternative to grain pasta. It has a higher protein content. Try to avoid significant consumption of white bread, sugary cereals, or pastries/baked goods.  Vegetables All vegetables. Cooked white potatoes do not count as vegetables.  Fruits All fruits, but limit pineapple and bananas as these fruits have a higher sugar content.  Meats and Other Protein Sources Lean, well-trimmed beef, veal, pork, and lamb. Chicken and [place] without skin. All fish and shellfish. Wild duck, rabbit, pheasant, and venison. Egg whites or low-cholesterol egg substitutes. Dried beans, peas, lentils, and tofu.Seeds and most nuts.  Dairy Low-fat or nonfat cheeses, including ricotta, string, and mozzarella. Skim or 1% milk that is liquid, powdered, or evaporated. Buttermilk that is made with low-fat milk. Nonfat or low-fat yogurt. Soy/Almond milk are good alternatives if you cannot handle dairy.  Beverages Water is the best for you. Sports drinks with less sugar are more desirable unless you are a highly active athlete.  Sweets and Desserts Sherbets and fruit ices. Honey, jam, marmalade, jelly, and syrups. Dark chocolate.  Eat all sweets and desserts in moderation.  Fats and Oils Nonhydrogenated (trans-free) margarines. Vegetable oils, including soybean, sesame, sunflower, olive, peanut, safflower, corn, canola, and cottonseed. Salad dressings or mayonnaise that are made with a vegetable oil. Limit added fats and oils that you use for cooking, baking, salads, and as spreads.  Other Cocoa powder. Coffee and tea. Most condiments.  The items listed above may not be a complete list of recommended foods or beverages. Contact your dietitian for more options.

## 2018-03-23 NOTE — Progress Notes (Signed)
Pre visit review using our clinic review tool, if applicable. No additional management support is needed unless otherwise documented below in the visit note. 

## 2018-03-24 ENCOUNTER — Other Ambulatory Visit: Payer: Self-pay | Admitting: Family Medicine

## 2018-03-24 ENCOUNTER — Encounter: Payer: Self-pay | Admitting: Family Medicine

## 2018-03-24 DIAGNOSIS — K760 Fatty (change of) liver, not elsewhere classified: Secondary | ICD-10-CM | POA: Insufficient documentation

## 2018-03-24 LAB — COMPREHENSIVE METABOLIC PANEL
ALBUMIN: 3.9 g/dL (ref 3.5–5.2)
ALT: 43 U/L — AB (ref 0–35)
AST: 32 U/L (ref 0–37)
Alkaline Phosphatase: 102 U/L (ref 39–117)
BILIRUBIN TOTAL: 0.5 mg/dL (ref 0.2–1.2)
BUN: 12 mg/dL (ref 6–23)
CALCIUM: 9.3 mg/dL (ref 8.4–10.5)
CHLORIDE: 101 meq/L (ref 96–112)
CO2: 28 mEq/L (ref 19–32)
CREATININE: 0.7 mg/dL (ref 0.40–1.20)
GFR: 92.93 mL/min (ref >60.00)
Glucose, Bld: 342 mg/dL — ABNORMAL HIGH (ref 70–99)
Potassium: 4.3 mEq/L (ref 3.5–5.1)
Sodium: 138 mEq/L (ref 135–145)
TOTAL PROTEIN: 6.3 g/dL (ref 6.0–8.3)

## 2018-03-24 LAB — MICROALBUMIN / CREATININE URINE RATIO
CREATININE, U: 62.1 mg/dL
MICROALB/CREAT RATIO: 1.1 mg/g (ref 0.0–30.0)

## 2018-03-24 LAB — TSH: TSH: 2.94 u[IU]/mL (ref 0.35–4.50)

## 2018-03-24 LAB — CBC
HCT: 44.5 % (ref 36.0–46.0)
Hemoglobin: 14.9 g/dL (ref 12.0–15.0)
MCHC: 33.5 g/dL (ref 30.0–36.0)
MCV: 87.7 fl (ref 78.0–100.0)
PLATELETS: 155 10*3/uL (ref 150.0–400.0)
RBC: 5.07 Mil/uL (ref 3.87–5.11)
RDW: 14.1 % (ref 11.5–15.5)
WBC: 7.4 10*3/uL (ref 4.0–10.5)

## 2018-03-24 LAB — IBC PANEL
IRON: 53 ug/dL (ref 42–145)
Saturation Ratios: 18.3 % — ABNORMAL LOW (ref 20.0–50.0)
TRANSFERRIN: 207 mg/dL — AB (ref 212.0–360.0)

## 2018-03-24 LAB — FERRITIN: Ferritin: 176.8 ng/mL (ref 10.0–291.0)

## 2018-03-24 LAB — HEMOGLOBIN A1C: Hgb A1c MFr Bld: 11 % — ABNORMAL HIGH (ref 4.6–6.5)

## 2018-03-24 MED ORDER — METFORMIN HCL ER 500 MG PO TB24: 500.0000 mg | ORAL_TABLET | Freq: Every day | ORAL | 2 refills | Status: DC

## 2018-03-24 NOTE — Progress Notes (Signed)
Metformin called in.

## 2018-04-01 MED FILL — metFORMIN HCL ER 500 MG TB2: 500 | 30 days supply | Qty: 30 | Fill #0

## 2018-04-08 ENCOUNTER — Telehealth: Payer: Self-pay | Admitting: *Deleted

## 2018-04-08 MED FILL — VICTOZA 18 MG/3 ML INJECT P: 18 | 30 days supply | Qty: 9 | Fill #1

## 2018-04-08 NOTE — Telephone Encounter (Signed)
Received fax from [place] requesting refill of Victoza. Pt just picked up RX on 04/07/18 and pharmacy states she should have enough for 1 month.  Going forward, please write Rx for 11mL or 3 pen box for 1 month supply at 1.8mg  per day.

## 2018-04-25 ENCOUNTER — Ambulatory Visit: Payer: 59 | Admitting: Family Medicine

## 2018-04-25 ENCOUNTER — Encounter: Payer: Self-pay | Admitting: Family Medicine

## 2018-04-25 VITALS — BP 110/78 | HR 88 | Temp 98.5°F | Ht 64.0 in | Wt 262.2 lb

## 2018-04-25 DIAGNOSIS — E119 Type 2 diabetes mellitus without complications: Secondary | ICD-10-CM

## 2018-04-25 MED ORDER — ONETOUCH LANCETS MISC: 1 refills | Status: AC

## 2018-04-25 MED ORDER — GLUCOSE BLOOD VI STRP: ORAL_STRIP | 1 refills | Status: DC

## 2018-04-25 MED ORDER — METFORMIN HCL 1000 MG PO TABS: 1000.0000 mg | ORAL_TABLET | Freq: Two times a day (BID) | ORAL | 3 refills | Status: DC

## 2018-04-25 MED ORDER — ONETOUCH VERIO W/DEVICE KIT: PACK | 0 refills | Status: AC

## 2018-04-25 MED FILL — metFORMIN HCL 1000 MG TABS: 1000 | 90 days supply | Qty: 180 | Fill #0

## 2018-04-25 NOTE — Progress Notes (Signed)
Pre visit review using our clinic review tool, if applicable. No additional management support is needed unless otherwise documented below in the visit note. 

## 2018-04-25 NOTE — Progress Notes (Signed)
Chief Complaint  Patient presents with  . Results    Subjective: Patient is a 52 y.o. female here for f/u DM.  A1c was 11 at last visit. Pt currently on Metformin XR 500 mg/d and Victoza 1.8 mg/d. Has lost 5 lbs since last visit. Has been walking and cleaning up the diet. Cannot take Metformin due to inability to swallow pills. Did not do well on [place] in past.   ROS: Heart: Denies chest pain   Past Medical History:  Diagnosis Date  . Asthma   . Diabetes mellitus without complication ([place])   . Diverticulitis   . Gallstones   . IBS (irritable bowel syndrome)   . NAFLD (nonalcoholic fatty liver disease)    CT scan 2015  . Ovarian cyst     Objective: BP 110/78 (BP Location: Left Arm, Patient Position: Sitting, Cuff Size: Large)   Pulse 88   Temp 98.5 F (36.9 C) (Oral)   Ht 5\' 4"  (1.626 m)   Wt 262 lb 4 oz (119 kg)   SpO2 99%   BMI 45.02 kg/m  General: Awake, appears stated age Lungs: No accessory muscle use Psych: Age appropriate judgment and insight, normal affect and mood  Assessment and Plan: Diabetes mellitus without complication ([place]) - Plan: metFORMIN (GLUCOPHAGE) 1000 MG tablet  Orders as above. Will crush tabs.  Counseled on diet and exercise. She is doing well.  Cont checking sugars.  I do worry that her pancreas as burned out and she is now DM 1.5 and will require insulin.  F/u in 2 weeks to reck. Will try to start SGLT2 if still not controlled.  The patient voiced understanding and agreement to the plan.  [place], DO 04/25/18  3:54 PM

## 2018-04-25 NOTE — Patient Instructions (Signed)
Check your sugars around 2-3 times per week. Alternate checking in the morning before you eat, in the afternoon and before bed. Write them down and bring it to your next appointment.   Keep the diet clean and stay active.  Lift weights.  Let us know if you need anything.

## 2018-04-27 ENCOUNTER — Ambulatory Visit: Payer: Self-pay | Admitting: *Deleted

## 2018-04-27 NOTE — Telephone Encounter (Signed)
Nurse also stated that she started taking the full dose 1 tab twice a day yesterday as she was only taking a 1/2 tablet.

## 2018-04-27 NOTE — Telephone Encounter (Signed)
Take 1/2 tab twice daily and we will see how things go. TY.

## 2018-04-27 NOTE — Telephone Encounter (Signed)
Called the patient informed of PCP instructions. She verbalize understanding.

## 2018-04-27 NOTE — Telephone Encounter (Signed)
Pt called with having diarrhea and nausea with vomiting since starting the Metformin.  She started taking it yesterday. Her blood sugar this morning was 193. She is taking the medication with food.  Flow at [facility] point notified regarding the patient's symptoms.  Will forward to provider and wait for a call back to patient.  Reason for Disposition . Caller has URGENT medication question about med that PCP prescribed and triager unable to answer question  Answer Assessment - Initial Assessment Questions 1. SYMPTOMS: "Do you have any symptoms?"     Yes diarrhea and nausea with vomiting 2. SEVERITY: If symptoms are present, ask "Are they mild, moderate or severe?"     severe  Protocols used: MEDICATION QUESTION CALL-A-AH

## 2018-05-02 ENCOUNTER — Other Ambulatory Visit: Payer: Self-pay | Admitting: Family Medicine

## 2018-05-02 MED FILL — SM CLEARLAX POWDER: 30 days supply | Qty: 510 | Fill #3

## 2018-05-02 MED FILL — VICTOZA 18 MG/3 ML INJECT P: 18 | 30 days supply | Qty: 9 | Fill #0

## 2018-05-09 ENCOUNTER — Encounter: Payer: Self-pay | Admitting: Family Medicine

## 2018-05-09 ENCOUNTER — Telehealth: Payer: Self-pay

## 2018-05-09 ENCOUNTER — Ambulatory Visit: Payer: 59 | Admitting: Family Medicine

## 2018-05-09 VITALS — BP 120/78 | HR 102 | Temp 98.3°F | Ht 64.0 in | Wt 259.2 lb

## 2018-05-09 DIAGNOSIS — E119 Type 2 diabetes mellitus without complications: Secondary | ICD-10-CM

## 2018-05-09 MED ORDER — DAPAGLIFLOZIN PROPANEDIOL 10 MG PO TABS: 10.0000 mg | ORAL_TABLET | Freq: Every day | ORAL | 2 refills | Status: DC

## 2018-05-09 MED ORDER — GLUCOSE BLOOD VI STRP: ORAL_STRIP | 1 refills | Status: DC

## 2018-05-09 NOTE — Progress Notes (Signed)
Chief Complaint  Patient presents with  . Blood Sugar Problem    Subjective: Patient is a 52 y.o. female here for DM f/u.  Did not do well with metformin 1 g bid, OK on 500 mg bid.  Her sugars have been in the low 200s/high 100s.  She has been checking daily.  She is also on Victoza 1.8 mg daily.  She is tolerating this well.  Her diet is healthy overall and she stays active playing with her grandchildren.  She has no scheduled exercise at this time.   ROS: Heart: Denies chest pain  Lungs: Denies SOB   Past Medical History:  Diagnosis Date  . Asthma   . Diabetes mellitus without complication ([place])   . Diverticulitis   . Gallstones   . IBS (irritable bowel syndrome)   . NAFLD (nonalcoholic fatty liver disease)    CT scan 2015  . Ovarian cyst     Objective: BP 120/78 (BP Location: Left Arm, Patient Position: Sitting, Cuff Size: Large)   Pulse (!) 102   Temp 98.3 F (36.8 C) (Oral)   Ht 5\' 4"  (1.626 m)   Wt 259 lb 4 oz (117.6 kg)   SpO2 96%   BMI 44.50 kg/m  General: Awake, appears stated age HEENT: MMM, EOMi, ears negative on the right, serous fluid on the left, no erythema, nares patent without discharge Heart: RRR Lungs: CTAB, no rales, wheezes or rhonchi. No accessory muscle use Psych: Age appropriate judgment and insight, normal affect and mood  Assessment and Plan: Diabetes mellitus without complication ([place]) - Plan: metFORMIN (GLUCOPHAGE) 1000 MG tablet  Change metformin to 500 mg twice daily so she is able to tolerate.  Continue Victoza 1.8 mg daily.  We will add Farxiga 10 mg daily.  A payment card was given.  If this is not affordable, she will let us know and we will call in Actos 30 mg daily.  Continue checking sugars at home.  Counseled on diet and exercise.  We will follow-up in 7 weeks when we are able to obtain an A1c. She did have a few upper respiratory complaints, she will continue Flonase.  Offered oral/IM steroid for years but she declined at this  time. The patient voiced understanding and agreement to the plan.  [place], DO 05/09/18  5:10 PM

## 2018-05-09 NOTE — Patient Instructions (Signed)
Let me know if medicine is too expensive.   Keep the diet clean and stay active.  Continue checking your blood sugars at home.  Continue the Flonase.   Let us know if you need anything.

## 2018-05-09 NOTE — Progress Notes (Signed)
Pre visit review using our clinic review tool, if applicable. No additional management support is needed unless otherwise documented below in the visit note. 

## 2018-05-09 NOTE — Telephone Encounter (Signed)
PA initiated via Covermymeds; KEY: [id number]. Awaiting determination.

## 2018-05-10 NOTE — Telephone Encounter (Signed)
Did the payment card work for her?

## 2018-05-10 NOTE — Telephone Encounter (Signed)
The patient did speak to her insurance co.  They will accept a letter of appeal stating why the patient needs to be on this-- medication and why she cannot be on something else/it is the best thing for her. (fax number for it to be faxed is [phone]).   Also the discount card will not work for her. She did call her insurance co regarding that as well.

## 2018-05-10 NOTE — Telephone Encounter (Signed)
Called the patient and she has not yet tried to use it, will do so and let us know if a problem

## 2018-05-10 NOTE — Telephone Encounter (Signed)
Patient states she would like Shirlean Mylar to call her back [phone]

## 2018-05-10 NOTE — Telephone Encounter (Signed)
PA denied. Medication is not a covered benefit of plan. No further information provided.

## 2018-05-11 ENCOUNTER — Encounter: Payer: Self-pay | Admitting: Family Medicine

## 2018-05-11 MED FILL — FLUTICASONE PROP 50 MCG SPR: 50 | 30 days supply | Qty: 16 | Fill #1

## 2018-05-11 NOTE — Telephone Encounter (Signed)
Letter written and placed on RE's desk.

## 2018-05-11 NOTE — Telephone Encounter (Signed)
Faxed letter to number below. Patient notified. Received fax confirmation. Letter on my desk.

## 2018-05-20 MED ORDER — PIOGLITAZONE HCL 30 MG PO TABS: 30.0000 mg | ORAL_TABLET | Freq: Every day | ORAL | 2 refills | Status: DC

## 2018-05-20 MED FILL — PIOGLITAZONE HCL 30 MG TAB: 30 | 30 days supply | Qty: 30 | Fill #0

## 2018-05-20 NOTE — Telephone Encounter (Signed)
Patient is calling because she is still fighting with the insurance company regarding the approval of the medication. Patient is willing to try something else. Based on Dr. Irene Limbo recommendation. However, patient has been dealing with this for a while. Please advise. This was appealed it was approved. But the plan administrator said can get over the counter drug for this medication.

## 2018-05-20 NOTE — Telephone Encounter (Signed)
Patient informed of PCP instructions/response.

## 2018-05-20 NOTE — Addendum Note (Signed)
Addended by: Ames Coupe on: 05/20/2018 11:14 AM   Modules accepted: Orders

## 2018-05-20 NOTE — Telephone Encounter (Signed)
There is no over the counter version of this medication in the [place]. While I would like her on this medication, I will call in a separate medication until it is. TY.

## 2018-06-03 MED FILL — VICTOZA 18 MG/3 ML INJECT P: 18 | 30 days supply | Qty: 9 | Fill #1

## 2018-06-06 DIAGNOSIS — H40033 Anatomical narrow angle, bilateral: Secondary | ICD-10-CM | POA: Diagnosis not present

## 2018-06-06 DIAGNOSIS — H04123 Dry eye syndrome of bilateral lacrimal glands: Secondary | ICD-10-CM | POA: Diagnosis not present

## 2018-06-15 ENCOUNTER — Telehealth: Payer: Self-pay

## 2018-06-15 NOTE — Telephone Encounter (Signed)
PA initiated via Covermymeds; KEY: [id number]. PA again denied.

## 2018-06-23 ENCOUNTER — Ambulatory Visit: Payer: 59 | Admitting: Family Medicine

## 2018-06-29 ENCOUNTER — Ambulatory Visit: Payer: 59 | Admitting: Family Medicine

## 2018-06-29 ENCOUNTER — Encounter: Payer: Self-pay | Admitting: Family Medicine

## 2018-06-29 VITALS — BP 118/76 | HR 87 | Temp 98.3°F | Ht 64.0 in | Wt 259.1 lb

## 2018-06-29 DIAGNOSIS — K582 Mixed irritable bowel syndrome: Secondary | ICD-10-CM

## 2018-06-29 DIAGNOSIS — E119 Type 2 diabetes mellitus without complications: Secondary | ICD-10-CM

## 2018-06-29 MED ORDER — INSULIN GLARGINE 100 UNIT/ML SOLOSTAR PEN: 15.0000 [IU] | PEN_INJECTOR | Freq: Every day | SUBCUTANEOUS | 11 refills | Status: DC

## 2018-06-29 MED ORDER — PROMETHAZINE HCL 25 MG PO TABS: 25.0000 mg | ORAL_TABLET | Freq: Three times a day (TID) | ORAL | 0 refills | Status: DC | PRN

## 2018-06-29 MED FILL — SM CLEARLAX POWDER: 30 days supply | Qty: 510 | Fill #4

## 2018-06-29 MED FILL — ULTICARE PEN NDL 8MM 31G: 31G X 8 MM | 90 days supply | Qty: 100 | Fill #0

## 2018-06-29 MED FILL — BASAGLAR 100 UNIT/ML KWIKPE: 100 | 80 days supply | Qty: 12 | Fill #0

## 2018-06-29 MED FILL — DICYCLOMINE 10 MG CAPSULE: 10 | 45 days supply | Qty: 90 | Fill #3

## 2018-06-29 MED FILL — PROMETHAZINE 25 MG TABLET: 25 | 6 days supply | Qty: 20 | Fill #0

## 2018-06-29 NOTE — Progress Notes (Signed)
Pre visit review using our clinic review tool, if applicable. No additional management support is needed unless otherwise documented below in the visit note. 

## 2018-06-29 NOTE — Patient Instructions (Addendum)
Keep the diet clean and stay active.  Continue the Victoza and Metformin.  Send me your morning sugar readings after 2 weeks.  Give [place] 2-3 business days to get the results of your labs back.   Think about the pneumonia shot.  Let me know if your flare doesn't get better with the peppermint.   If you do not hear anything about your referral in the next 1-2 weeks, call our office and ask for an update.  Let us know if you need anything.

## 2018-06-29 NOTE — Progress Notes (Signed)
Chief Complaint  Patient presents with  . Follow-up    blood sugar    Subjective: Patient is a 53 y.o. female here for f/u.  Patient on Victoza 1.8 mg daily and 500 mg twice daily for diabetes.  Last A1c around 3 months ago was 11.0.  Diet is been healthy overall.  There was a marked change over a relatively short amount of time despite no major lifestyle changes.  Sugars have been in the mid to high 200s.  She is trying to stay active.  She did get the flu shot this year and is thinking about the pneumonia shot.  She is up-to-date with her eye exam.  She has a history of IBS.  She is currently having a flare.  Using the past, MiraLAX and Bentyl will fix this issue.  Heparin has been helpful in the past but she was told that it was a months of nonsense.  She will have intermittent diarrhea and subsequent constipation.  She is also having abdominal pain.   ROS:  GI: As noted in HPI Constitutional: No unexplained weight loss  Past Medical History:  Diagnosis Date  . Asthma   . Diabetes mellitus without complication ([place])   . Diverticulitis   . Gallstones   . IBS (irritable bowel syndrome)   . NAFLD (nonalcoholic fatty liver disease)    CT scan 2015  . Ovarian cyst     Objective: BP 118/76 (BP Location: Left Arm, Patient Position: Sitting, Cuff Size: Large)   Pulse 87   Temp 98.3 F (36.8 C) (Oral)   Ht 5\' 4"  (1.626 m)   Wt 259 lb 2 oz (117.5 kg)   SpO2 98%   BMI 44.48 kg/m  General: Awake, appears stated age HEENT: MMM, EOMi Heart: RRR, no lower extremity edema, no bruits Lungs: CTAB, no rales, wheezes or rhonchi. No accessory muscle use Abdomen: Soft, mildly distended, diffuse tenderness to palpation, no masses or organomegaly Psych: Age appropriate judgment and insight, normal affect and mood  Assessment and Plan: Diabetes mellitus without complication ([facility]) - Plan: Hemoglobin A1c, Insulin Glargine (LANTUS) 100 UNIT/ML Solostar Pen, Ambulatory referral to diabetic  education  Irritable bowel syndrome with both constipation and diarrhea - Plan: promethazine (PHENERGAN) 25 MG tablet  Orders as above.  Start insulin, 15 units nightly.  She will send [place] a message in 2 weeks regarding her readings and we will adjust accordingly.  I will see her again in 4 weeks.  I think that she is becoming a type I convert.  Recommended she get a pneumonia vaccine but she declined today. Phenergan for above, peppermint is not helpful, will consider adding Elavil.  She will let us know. The patient voiced understanding and agreement to the plan.  [place], DO 06/29/18  4:47 PM

## 2018-06-29 NOTE — Telephone Encounter (Signed)
Opened in error

## 2018-06-30 ENCOUNTER — Other Ambulatory Visit: Payer: 59

## 2018-06-30 ENCOUNTER — Other Ambulatory Visit (INDEPENDENT_AMBULATORY_CARE_PROVIDER_SITE_OTHER): Payer: 59

## 2018-06-30 DIAGNOSIS — E119 Type 2 diabetes mellitus without complications: Secondary | ICD-10-CM

## 2018-07-01 ENCOUNTER — Encounter: Payer: Self-pay | Admitting: Family Medicine

## 2018-07-01 LAB — HEMOGLOBIN A1C: Hgb A1c MFr Bld: 9.5 % — ABNORMAL HIGH (ref 4.6–6.5)

## 2018-07-01 MED FILL — VICTOZA 18 MG/3 ML INJECT P: 18 | 30 days supply | Qty: 9 | Fill #2

## 2018-07-05 MED FILL — FLUTICASONE PROP 50 MCG SPR: 50 | 30 days supply | Qty: 16 | Fill #1

## 2018-07-11 ENCOUNTER — Other Ambulatory Visit: Payer: Self-pay | Admitting: Family Medicine

## 2018-07-11 DIAGNOSIS — E119 Type 2 diabetes mellitus without complications: Secondary | ICD-10-CM

## 2018-07-13 ENCOUNTER — Encounter: Payer: 59 | Attending: Family Medicine | Admitting: *Deleted

## 2018-07-13 DIAGNOSIS — E119 Type 2 diabetes mellitus without complications: Secondary | ICD-10-CM | POA: Insufficient documentation

## 2018-07-13 NOTE — Patient Instructions (Signed)
Plan:  Aim for 3 Carb Choices per meal (45 grams) +/- 1 either way  Aim for 0-1 Carbs per snack if hungry  Include protein in moderation with your meals and snacks Consider reading food labels for Total Carbohydrate of foods Consider  increasing your activity level by walking or Arm Chair Exercises daily as tolerated Continue checking BG at alternate times per day as directed by MD  Continue taking medication as directed by MD

## 2018-07-21 NOTE — Progress Notes (Signed)
Diabetes Self-Management Education  Visit Type: First/Initial  Appt. Start Time: 1530 Appt. End Time: 1700  07/21/2018  [name], identified by name and date of birth, is a 53 y.o. female with a diagnosis of Diabetes: Type 2. Patient works in Therap[employer], art full time and has had 2 recent ankle and foot surgeries that have limited her ability to be active. She would like to learn more about food choices and overall options to improve her control of her diabetes today  ASSESSMENT  There were no vitals taken for this visit. There is no height or weight on file to calculate BMI.  Diabetes Self-Management Education - 07/13/18 1546      Visit Information   Visit Type  First/Initial      Initial Visit   Diabetes Type  Type 2    Are you currently following a meal plan?  No    Are you taking your medications as prescribed?  Yes    Date Diagnosed  2010      Health Coping   How would you rate your overall health?  Good      Psychosocial Assessment   Patient Belief/Attitude about Diabetes  Afraid    Self-care barriers  None    Other persons present  Patient    Patient Concerns  Nutrition/Meal planning;Glycemic Control    Special Needs  None    Learning Readiness  Change in progress    How often do you need to have someone help you when you read instructions, pamphlets, or other written materials from your doctor or pharmacy?  1 - Never    What is the last grade level you completed in school?  12      Pre-Education Assessment   Patient understands the diabetes disease and treatment process.  Needs Instruction    Patient understands incorporating nutritional management into lifestyle.  Needs Instruction    Patient undertands incorporating physical activity into lifestyle.  Needs Review    Patient understands using medications safely.  Needs Review    Patient understands monitoring blood glucose, interpreting and using results  Needs Review    Patient understands prevention,  detection, and treatment of acute complications.  Needs Review    Patient understands prevention, detection, and treatment of chronic complications.  Needs Review    Patient understands how to develop strategies to address psychosocial issues.  Needs Instruction    Patient understands how to develop strategies to promote health/change behavior.  Needs Instruction      Complications   Last HgB A1C per patient/outside source  9.5 %    How often do you check your blood sugar?  3-4 times/day    Have you had a dilated eye exam in the past 12 months?  Yes    Have you had a dental exam in the past 12 months?  No    Are you checking your feet?  Yes    How many days per week are you checking your feet?  7      Dietary Intake   Breakfast  oatmeal with Splenda OR eggs and 2 toast with spray [place]    Snack (morning)  occasionaly pack of crackers OR fresh fruit    Lunch  brings left overs or sandwich occasionally with chips    Snack (afternoon)  not usually    Dinner  at home most nights- meat, vegetables and occasional starch    Snack (evening)  1-2 cookies with milk because of fear of low  BG during the night    Beverage(s)  water, diet soda, Crystal Light      Exercise   Exercise Type  ADL's      Patient Education   Previous Diabetes Education  No    Disease state   Factors that contribute to the development of diabetes    Nutrition management   Role of diet in the treatment of diabetes and the relationship between the three main macronutrients and blood glucose level;Food label reading, portion sizes and measuring food.;Carbohydrate counting;Reviewed blood glucose goals for pre and post meals and how to evaluate the patients' food intake on their blood glucose level.    Physical activity and exercise   Role of exercise on diabetes management, blood pressure control and cardiac health.;Helped patient identify appropriate exercises in relation to his/her diabetes, diabetes complications and other  health issue.    Medications  Reviewed patients medication for diabetes, action, purpose, timing of dose and side effects.    Monitoring  Purpose and frequency of SMBG.;Identified appropriate SMBG and/or A1C goals.    Acute complications  Taught treatment of hypoglycemia - the 15 rule.      Individualized Goals (developed by patient)   Nutrition  Follow meal plan discussed    Physical Activity  Exercise 3-5 times per week    Medications  take my medication as prescribed    Monitoring   test blood glucose pre and post meals as discussed      Post-Education Assessment   Patient understands the diabetes disease and treatment process.  Demonstrates understanding / competency    Patient understands incorporating nutritional management into lifestyle.  Demonstrates understanding / competency    Patient undertands incorporating physical activity into lifestyle.  Demonstrates understanding / competency    Patient understands using medications safely.  Demonstrates understanding / competency    Patient understands monitoring blood glucose, interpreting and using results  Demonstrates understanding / competency    Patient understands prevention, detection, and treatment of acute complications.  Demonstrates understanding / competency    Patient understands prevention, detection, and treatment of chronic complications.  Demonstrates understanding / competency    Patient understands how to develop strategies to address psychosocial issues.  Demonstrates understanding / competency    Patient understands how to develop strategies to promote health/change behavior.  Demonstrates understanding / competency      Outcomes   Expected Outcomes  Demonstrated interest in learning. Expect positive outcomes    Future DMSE  PRN    Program Status  Completed       Individualized Plan for Diabetes Self-Management Training:   Learning Objective:  Patient will have a greater understanding of diabetes  self-management. Patient education plan is to attend individual and/or group sessions per assessed needs and concerns.   Plan:   Patient Instructions  Plan:  Aim for 3 Carb Choices per meal (45 grams) +/- 1 either way  Aim for 0-1 Carbs per snack if hungry  Include protein in moderation with your meals and snacks Consider reading food labels for Total Carbohydrate of foods Consider  increasing your activity level by walking or Arm Chair Exercises daily as tolerated Continue checking BG at alternate times per day as directed by MD  Continue taking medication as directed by MD  Expected Outcomes:  Demonstrated interest in learning. Expect positive outcomes  Education material provided: Food label handouts, A1C conversion sheet, Meal plan card and Carbohydrate counting sheet  If problems or questions, patient to contact team via:  Phone  Future DSME appointment: PRN

## 2018-07-27 ENCOUNTER — Encounter: Payer: Self-pay | Admitting: Family Medicine

## 2018-07-27 ENCOUNTER — Ambulatory Visit: Payer: 59 | Admitting: Family Medicine

## 2018-07-27 VITALS — BP 108/72 | HR 95 | Temp 97.9°F | Ht 64.0 in | Wt 262.2 lb

## 2018-07-27 DIAGNOSIS — E119 Type 2 diabetes mellitus without complications: Secondary | ICD-10-CM

## 2018-07-27 NOTE — Progress Notes (Signed)
Chief Complaint  Patient presents with  . Follow-up    diabetes    Subjective: Patient is a 53 y.o. female here for f/u DM.   Patient's sugar readings are now in the mid 200s down from the high 200s/low 300s.  She is currently taking 25 units of Lantus nightly in addition to Victoza 1.8 mg daily and metformin 500 mg daily.  The metformin is still giving her GI upset.  She has IBS as well and this is not helpful.  She starting to exercise more often and her diet is healthy overall.  She did see a dietitian prior to this appt.  ROS: Const: No unexplained weight loss.  Past Medical History:  Diagnosis Date  . Asthma   . Diabetes mellitus without complication ([place])   . Diverticulitis   . Gallstones   . IBS (irritable bowel syndrome)   . NAFLD (nonalcoholic fatty liver disease)    CT scan 2015  . Ovarian cyst     Objective: BP 108/72 (BP Location: Left Arm, Patient Position: Sitting, Cuff Size: Large)   Pulse 95   Temp 97.9 F (36.6 C) (Oral)   Ht 5\' 4"  (1.626 m)   Wt 262 lb 4 oz (119 kg)   SpO2 98%   BMI 45.02 kg/m  General: Awake, appears stated age Lungs: o accessory muscle use Psych: Age appropriate judgment and insight, normal affect and mood  Assessment and Plan: Diabetes mellitus without complication ([place]) - Plan: Insulin Glargine (LANTUS) 100 UNIT/ML Solostar Pen, DISCONTINUED: metFORMIN (GLUCOPHAGE) 1000 MG tablet  Increase Lantus to 30 units nightly.  Increase every third day by 3 units nightly if sugars are greater than 150 in the morning.  Counseled on diet and exercise. Patient refused pneumonia vaccine.  Will need to do foot exam at next visit. Follow-up in 5 weeks. The patient voiced understanding and agreement to the plan.  [place], DO 07/27/18  5:03 PM

## 2018-07-27 NOTE — Patient Instructions (Addendum)
We are now on 30 units nightly for the Lantus. Increase every 3rd day if your morning readings are more than 150. If you start getting symptoms of hypoglycemia (low sugar) such as sweating, confusion, rapid heartbeat, lightheadedness, then decrease the units by 3 and don't change until we see each other next.  Keep the diet clean and stay active.  Let us know if you change your mind about the pneumonia vaccine.  Let us know if you need anything.

## 2018-07-28 ENCOUNTER — Encounter: Payer: Self-pay | Admitting: Family Medicine

## 2018-07-29 ENCOUNTER — Other Ambulatory Visit: Payer: Self-pay | Admitting: Family Medicine

## 2018-07-29 ENCOUNTER — Telehealth: Payer: Self-pay

## 2018-07-29 MED ORDER — FREESTYLE LIBRE READER DEVI: 1.0000 | Freq: Every day | 0 refills | Status: DC

## 2018-07-29 MED ORDER — FREESTYLE LIBRE 14 DAY SENSOR MISC: 1.0000 | Freq: Every day | 0 refills | Status: DC

## 2018-07-29 NOTE — Telephone Encounter (Signed)
Patient informed. 

## 2018-07-29 NOTE — Telephone Encounter (Signed)
Please inform pt. TY.

## 2018-07-29 NOTE — Telephone Encounter (Signed)
PA initiated via Covermymeds  Key for sensors: [id number] Key for device: [id number]    Awaiting determination.

## 2018-07-29 NOTE — Telephone Encounter (Signed)
PA's denied. Freestyle Libre covered for those w/ type 1 diabetes and those w/ type 2 diabetes that are insulin dependent (3 or more injections of insulin daily).

## 2018-08-01 ENCOUNTER — Other Ambulatory Visit: Payer: Self-pay | Admitting: Family Medicine

## 2018-08-01 MED ORDER — LIRAGLUTIDE 18 MG/3ML ~~LOC~~ SOPN: PEN_INJECTOR | SUBCUTANEOUS | 3 refills | Status: DC

## 2018-08-01 MED FILL — VICTOZA 18 MG/3 ML INJECT P: 18 | 30 days supply | Qty: 9 | Fill #0

## 2018-08-05 ENCOUNTER — Encounter: Payer: Self-pay | Admitting: Gastroenterology

## 2018-08-08 IMAGING — DX DG FOREARM 2V*R*
2 series · 2 of 2 positions shown · non-contrast
Comparison: None.

CLINICAL DATA: Pain following fall

EXAM:
RIGHT FOREARM - 2 VIEW

[forearm ap]
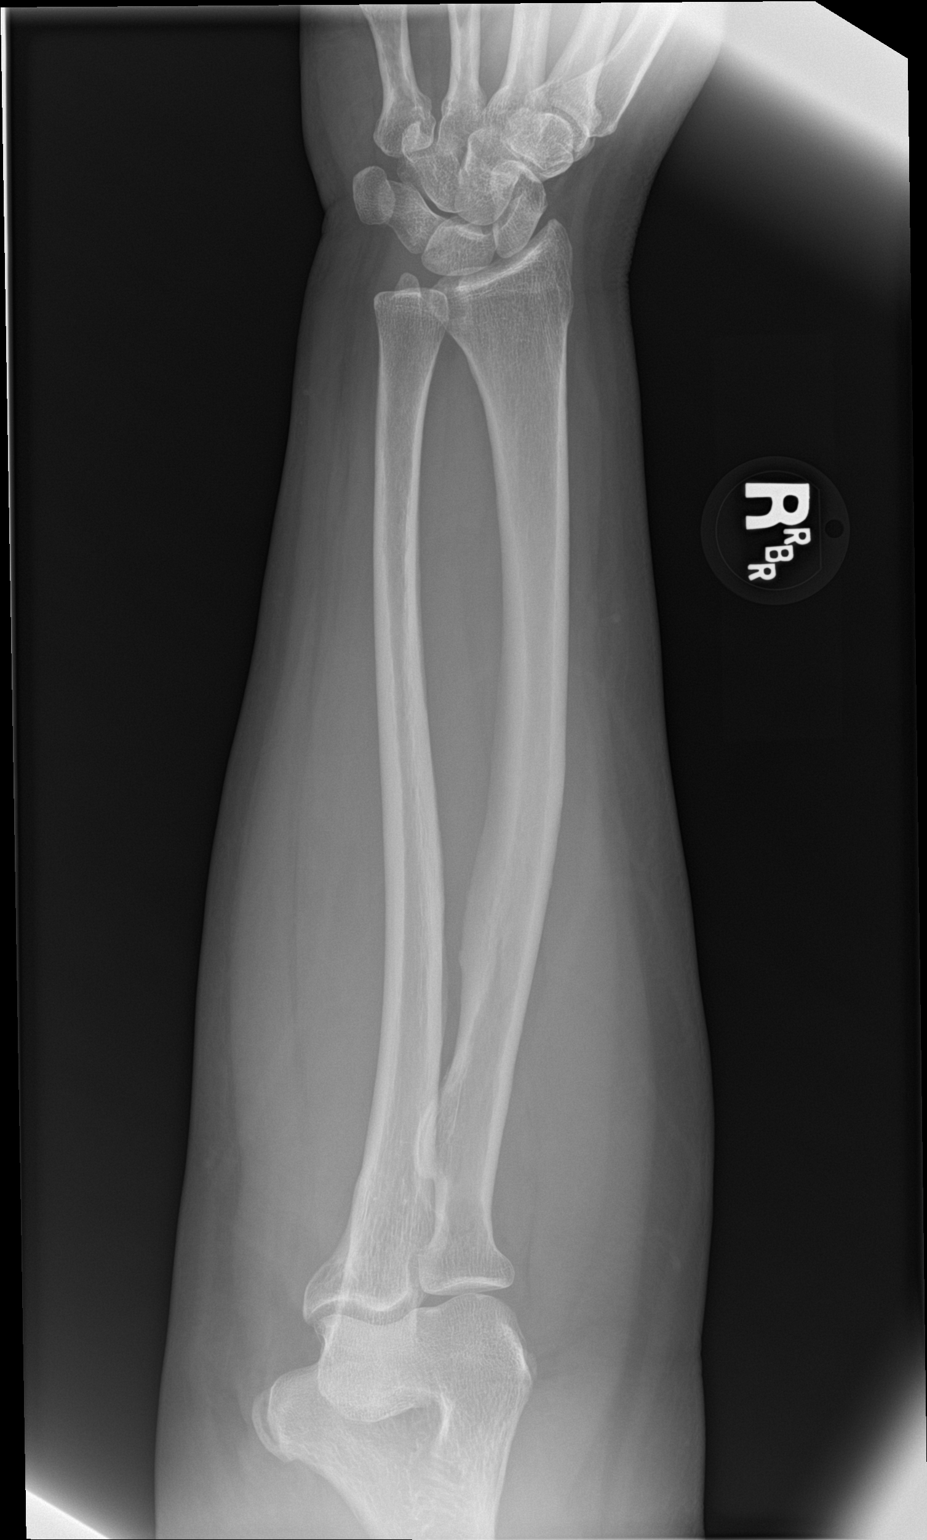

[forearm lat]
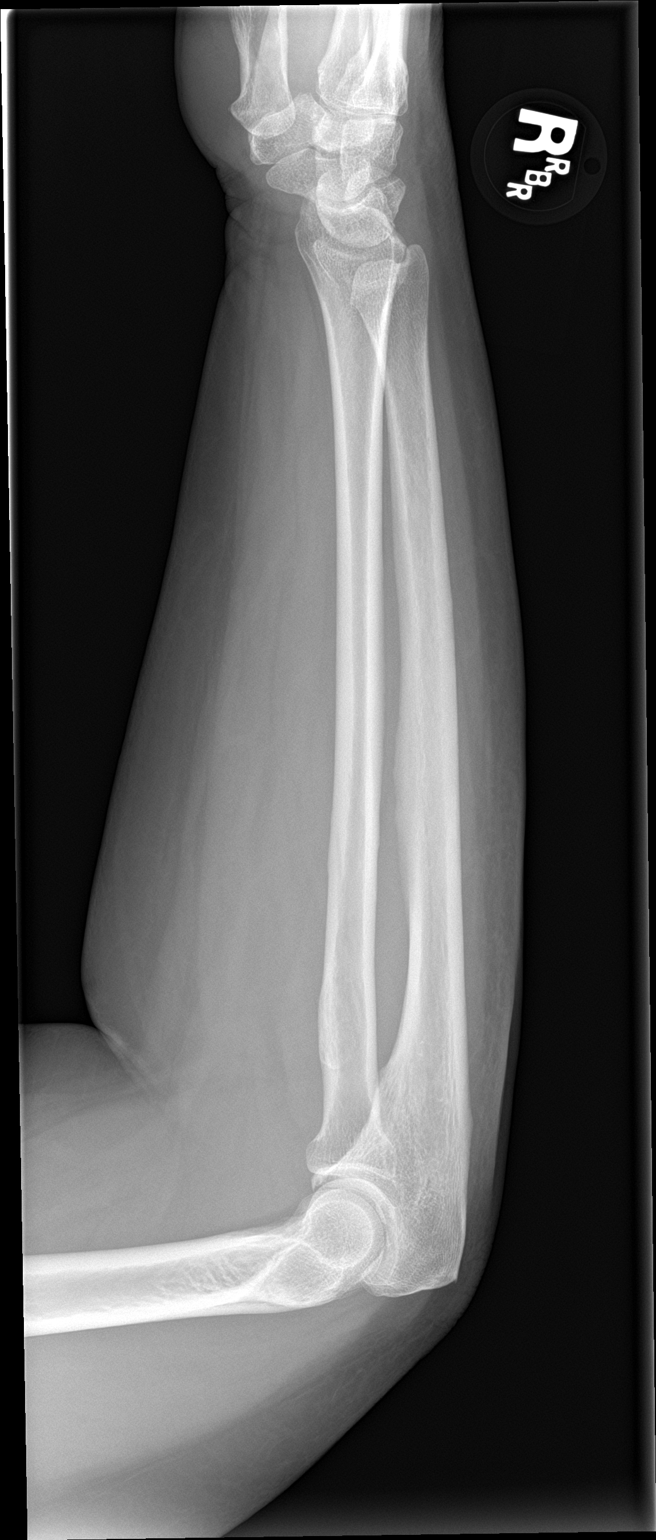

[2 of 2 positions shown; findings below may reference images not displayed]

FINDINGS: Frontal and lateral views were obtained. No fracture or dislocation.
The joint spaces appear unremarkable. No elbow joint effusion
appreciable.
IMPRESSION: No demonstrable fracture or dislocation. No appreciable arthropathy.

## 2018-08-08 IMAGING — DX DG WRIST 2V*R*
2 series · 2 of 2 positions shown · non-contrast
Comparison: December 16, 2011

CLINICAL DATA: Pain following fall

EXAM:
RIGHT WRIST - 2 VIEW

[wrist pa]
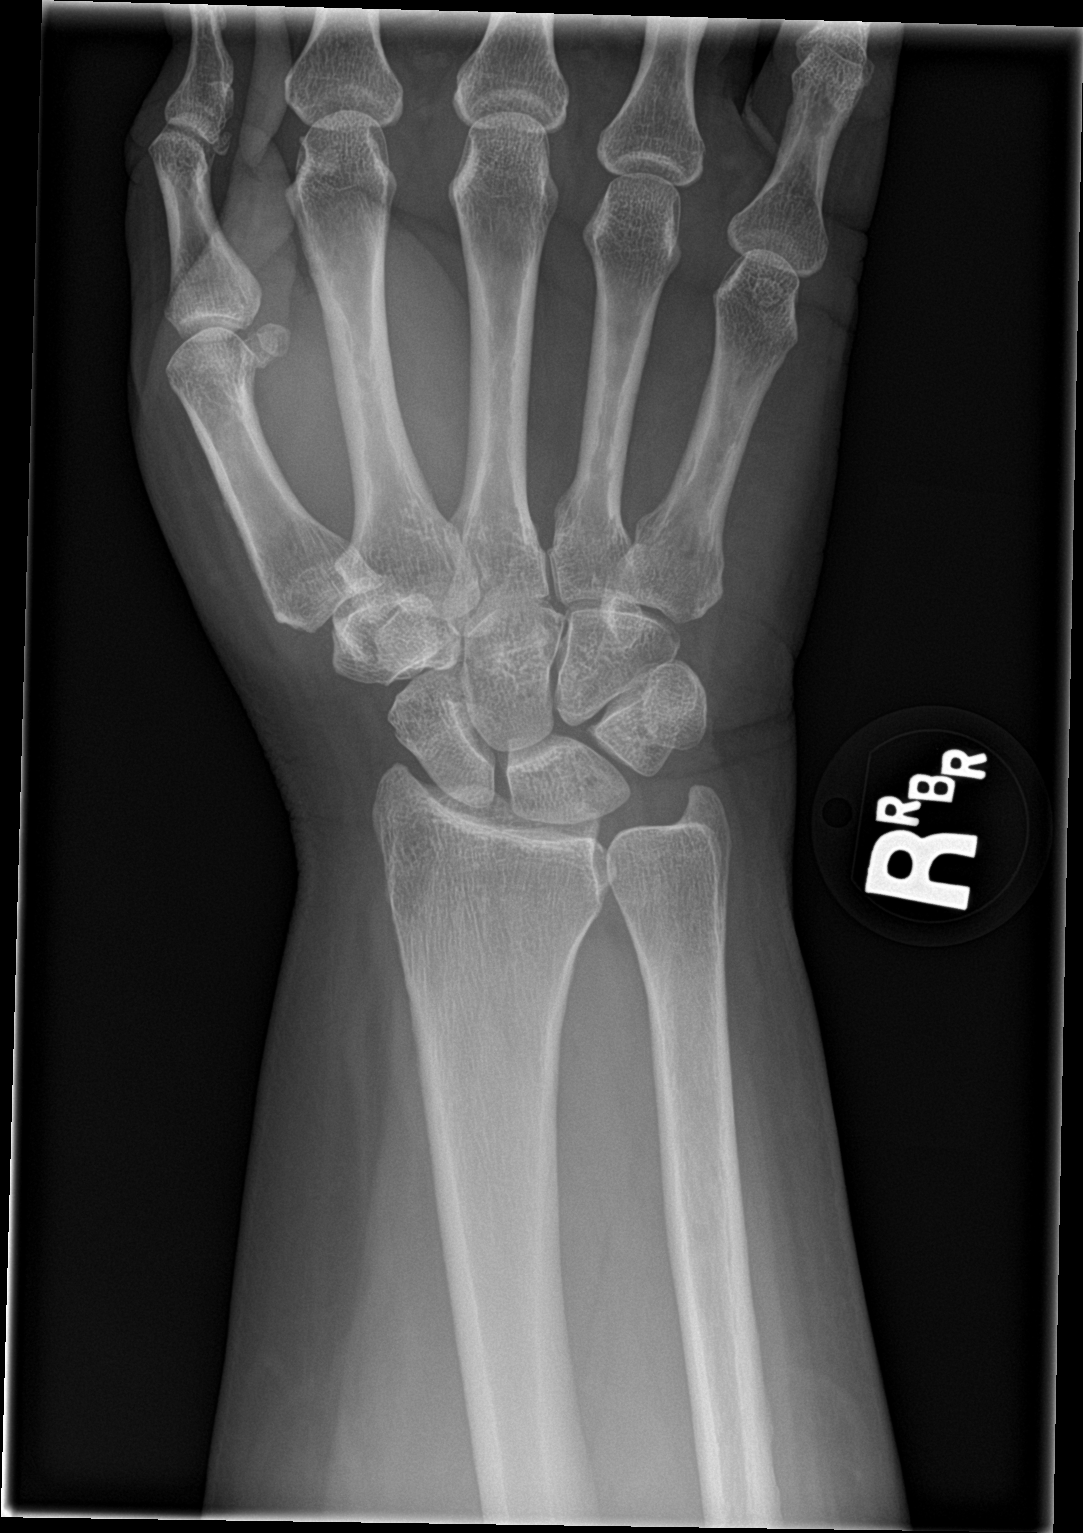

[wrist lat]
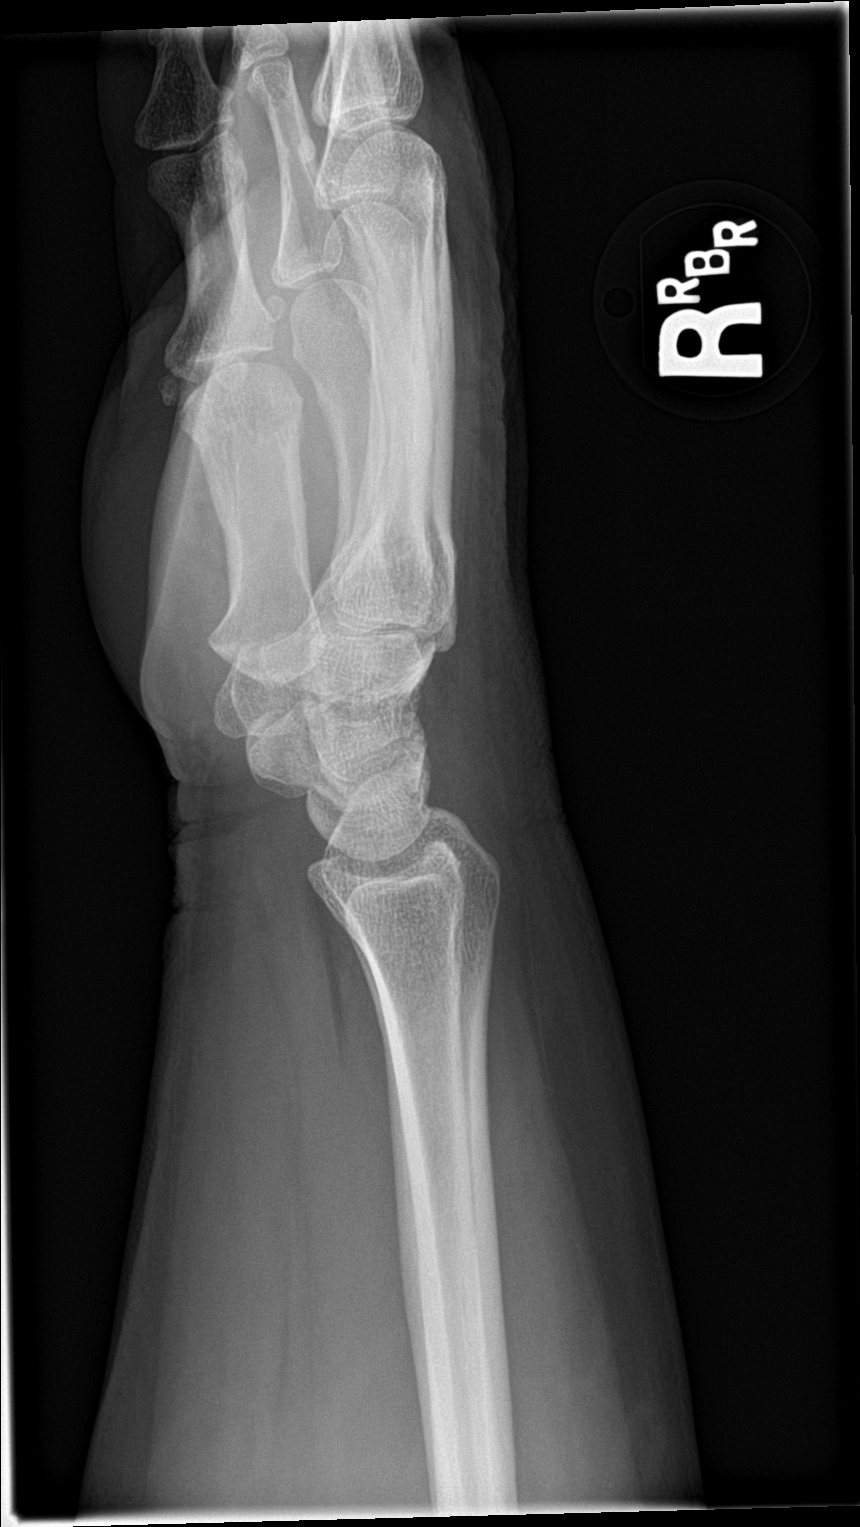

[2 of 2 positions shown; findings below may reference images not displayed]

FINDINGS: Frontal and lateral views were obtained. There is no fracture or
dislocation. Joint spaces appear normal. No erosive change. Pronator
quadratus fat pad is not appreciably elevated.
IMPRESSION: No fracture or dislocation.  No appreciable arthropathy.

## 2018-08-08 IMAGING — DX DG KNEE 3 VIEWS*L*
3 series · 3 of 3 positions shown · non-contrast
Comparison: March 18, 2015

CLINICAL DATA: Pain following fall

EXAM:
LEFT KNEE - 3 VIEW

[knee ap]
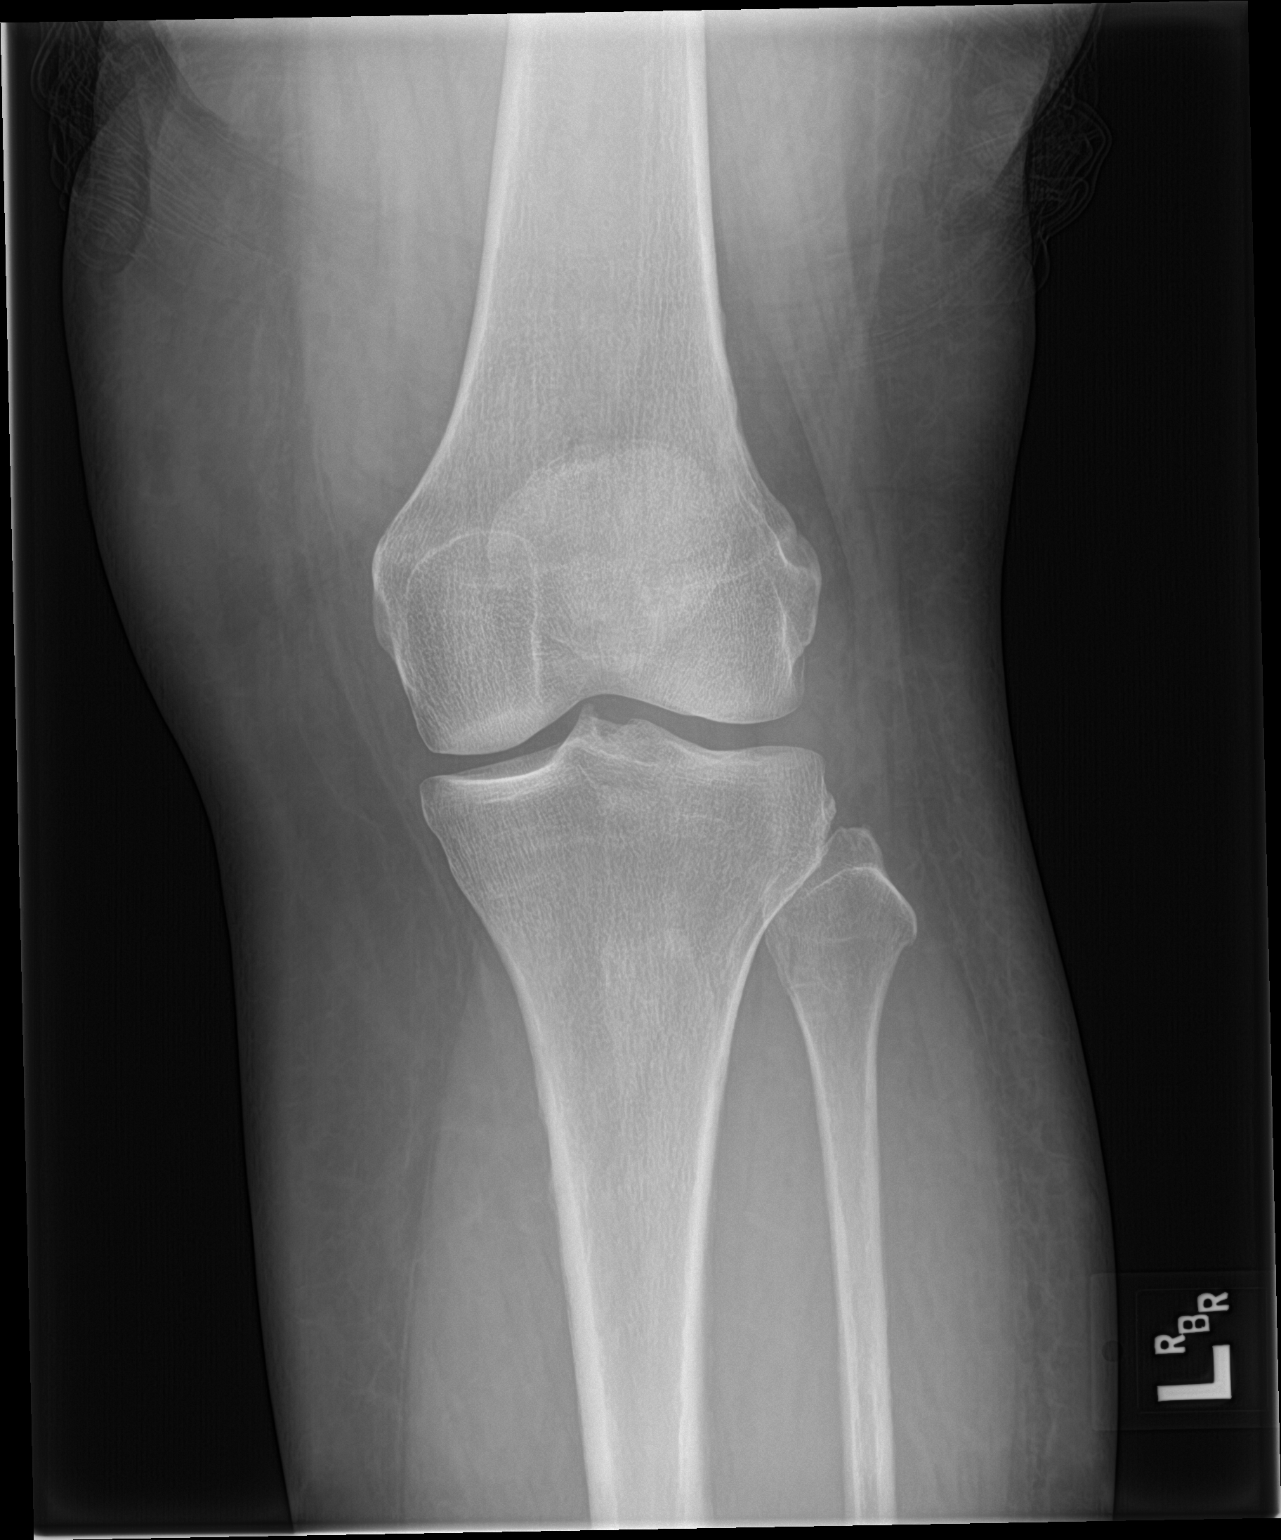

[knee lat]
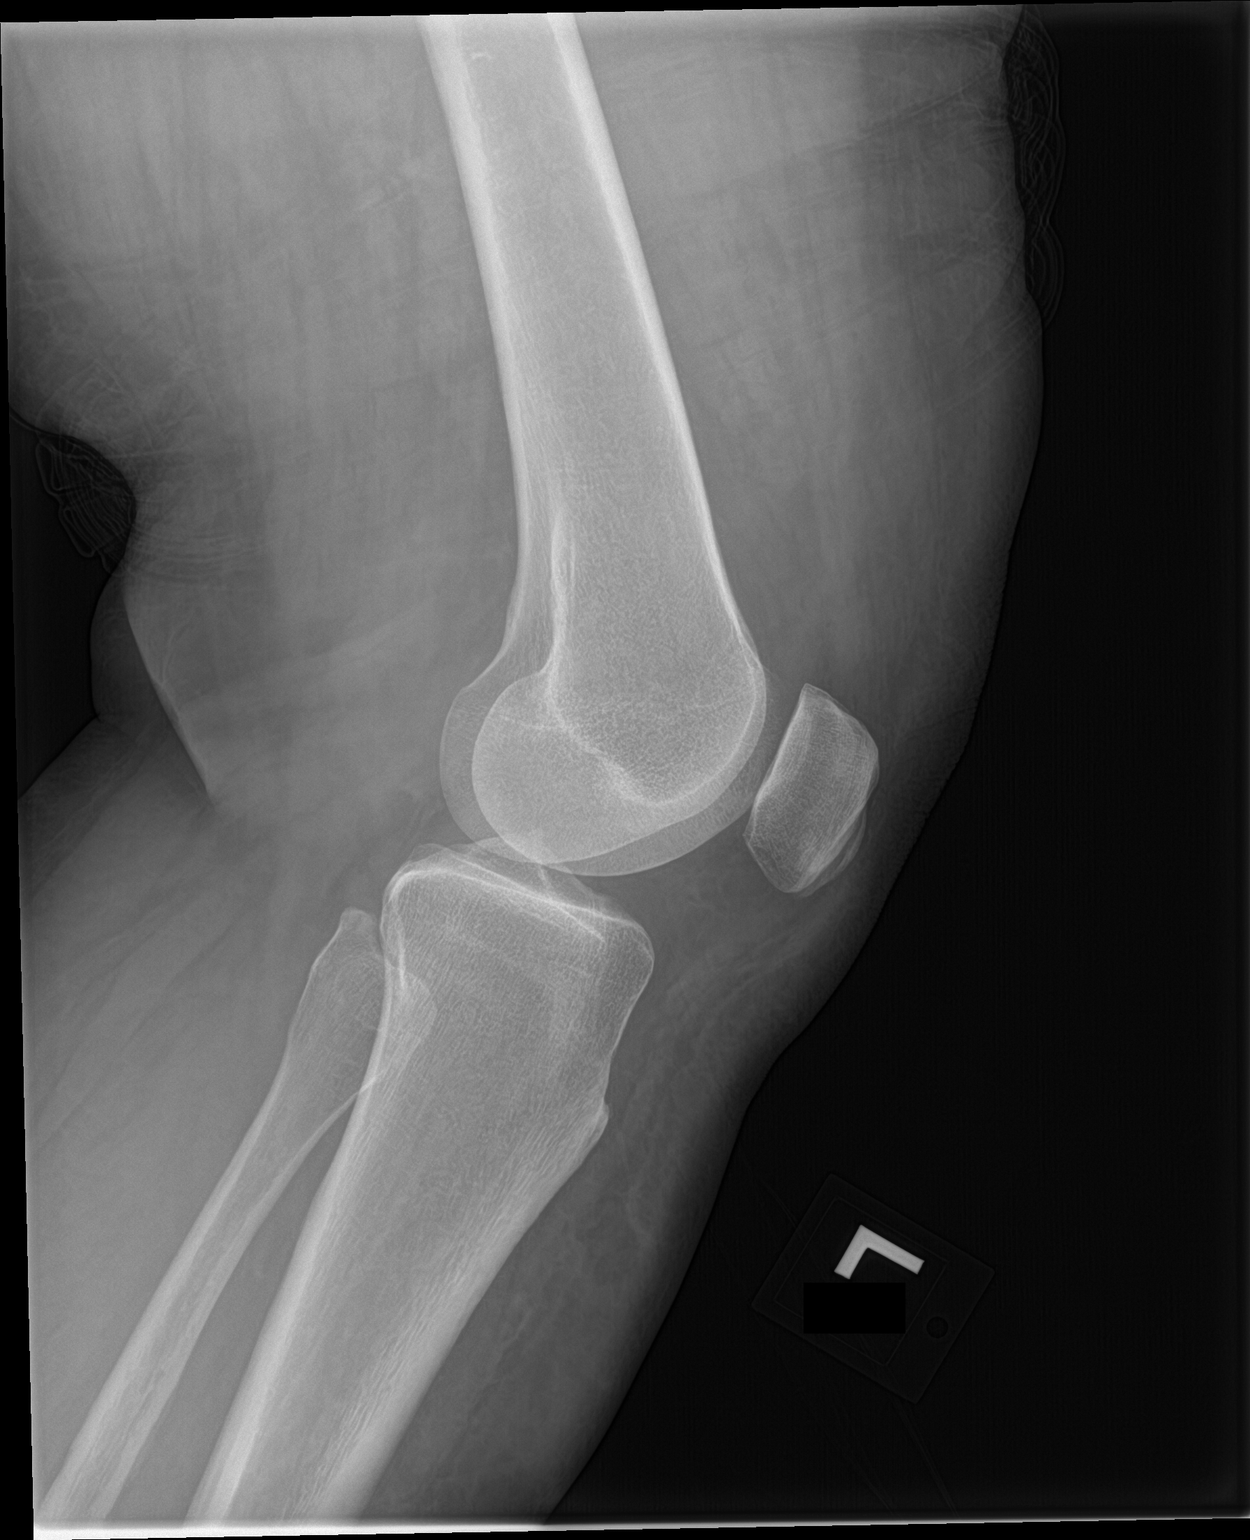

[knee sunrise]
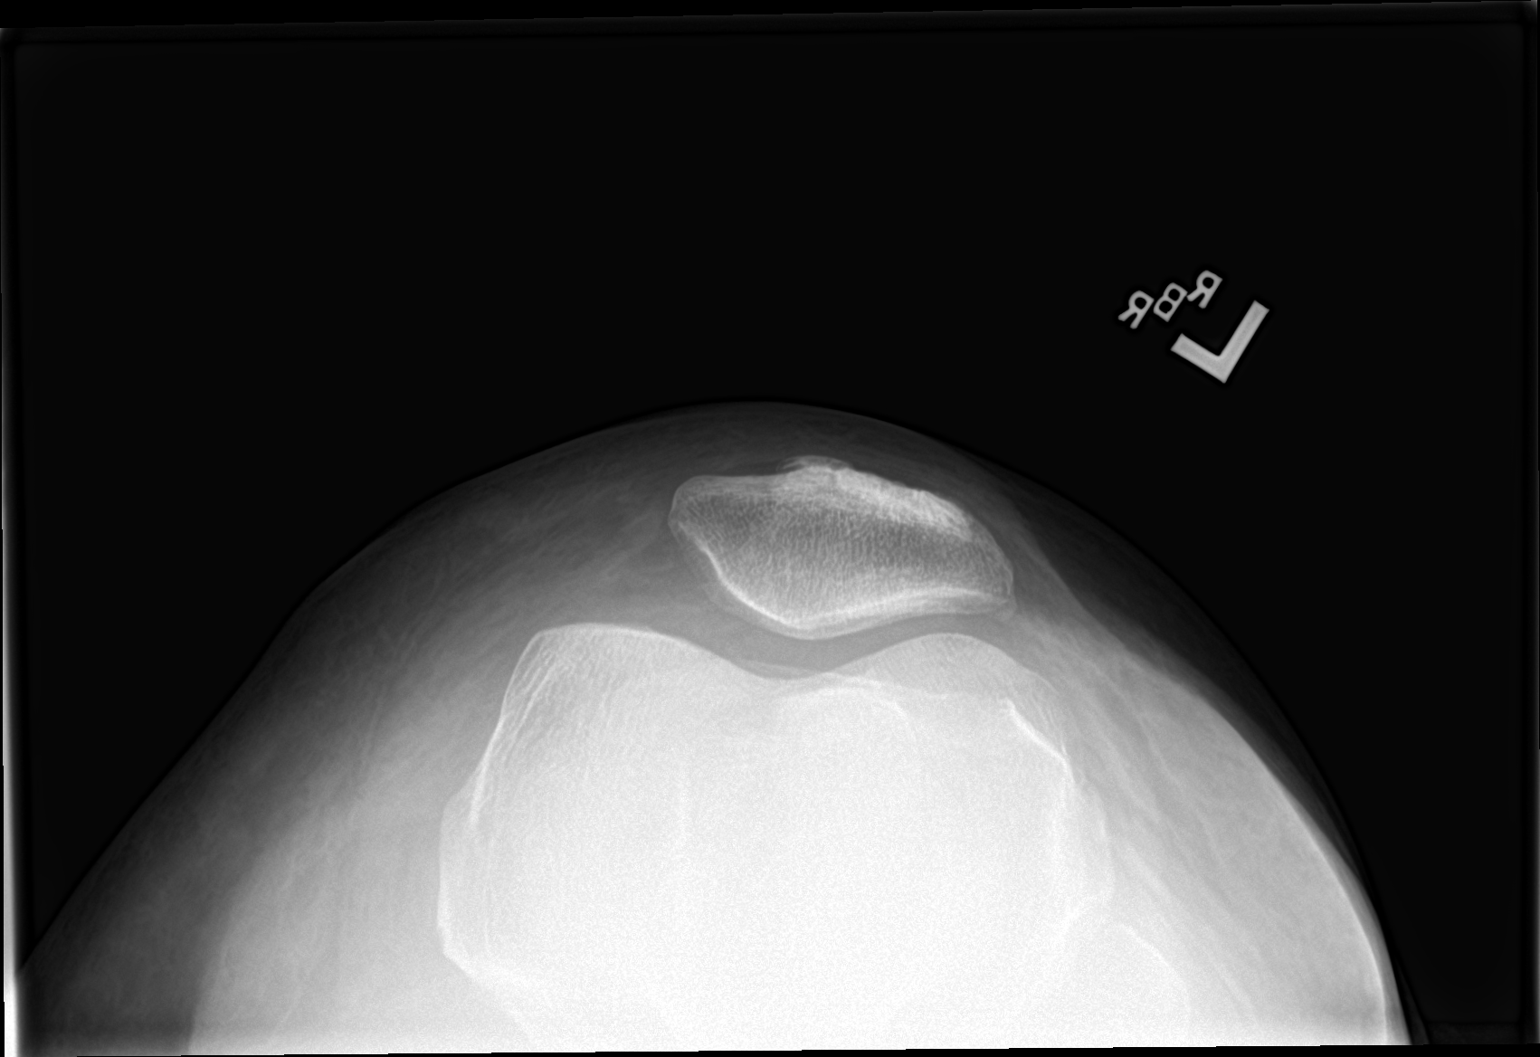

[3 of 3 positions shown; findings below may reference images not displayed]

FINDINGS: Frontal, lateral, and sunrise patellar images were obtained. No
fracture or dislocation. No joint effusion. Joint spaces appear
normal. No erosive change.
IMPRESSION: No fracture or joint effusion.  No evident arthropathic change.

## 2018-08-08 IMAGING — DX DG ANKLE COMPLETE 3+V*R*
3 series · 3 of 3 positions shown · non-contrast
Comparison: December 16, 2011

CLINICAL DATA: Pain following fall

EXAM:
RIGHT ANKLE - COMPLETE 3+ VIEW

[ankle ap]
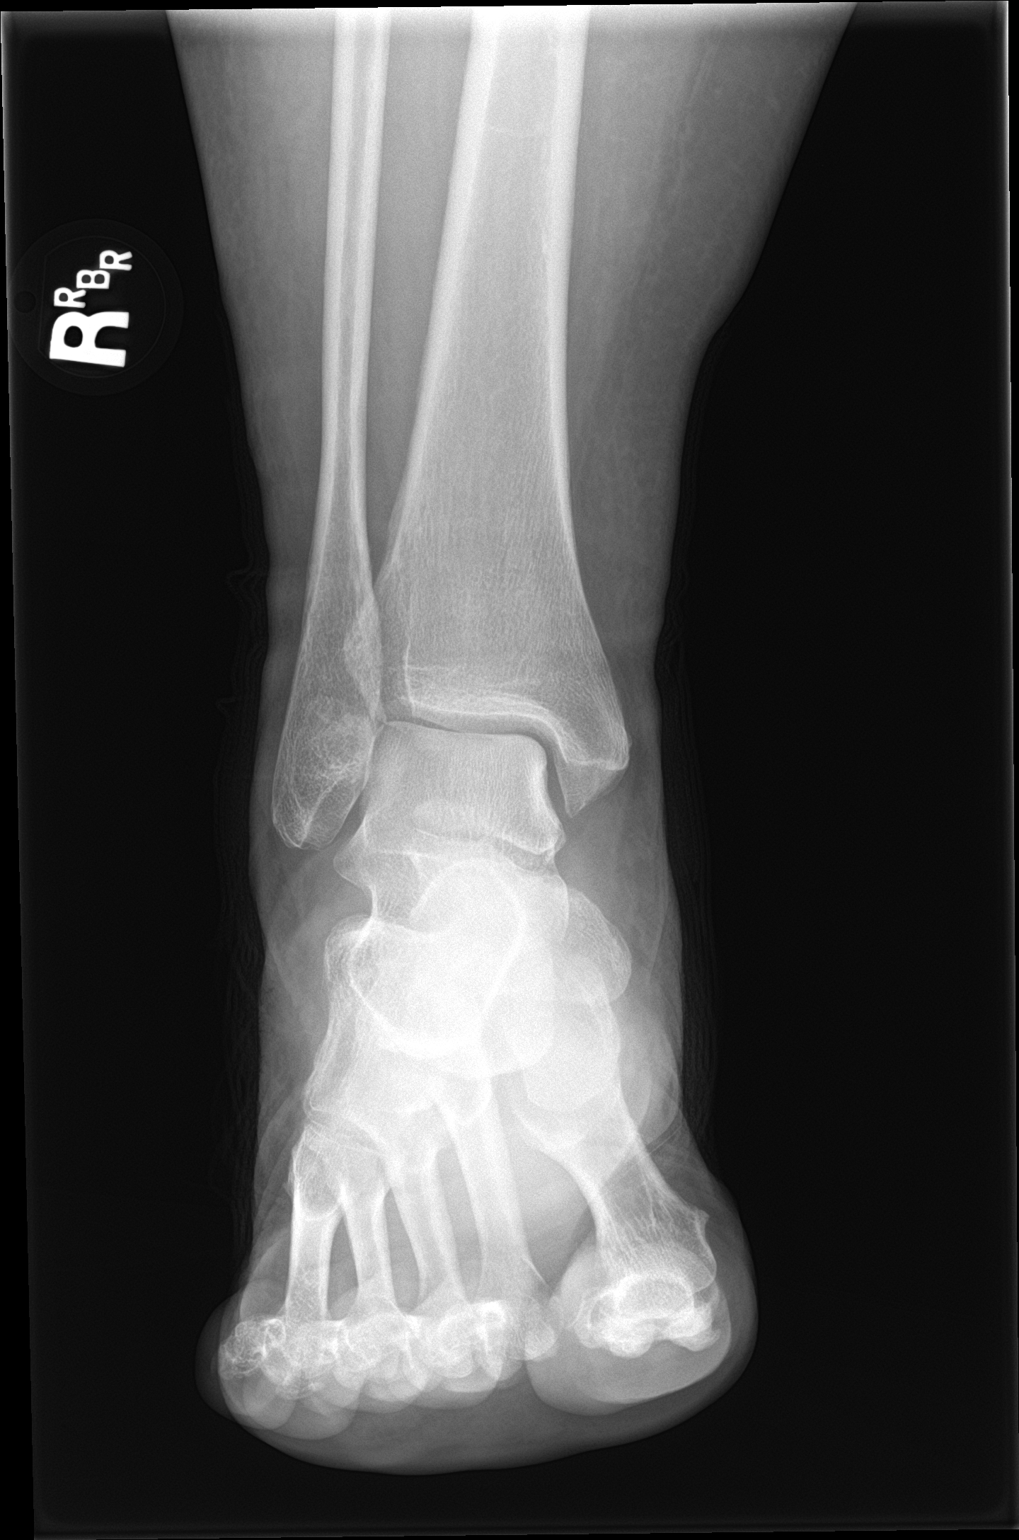

[ankle obl]
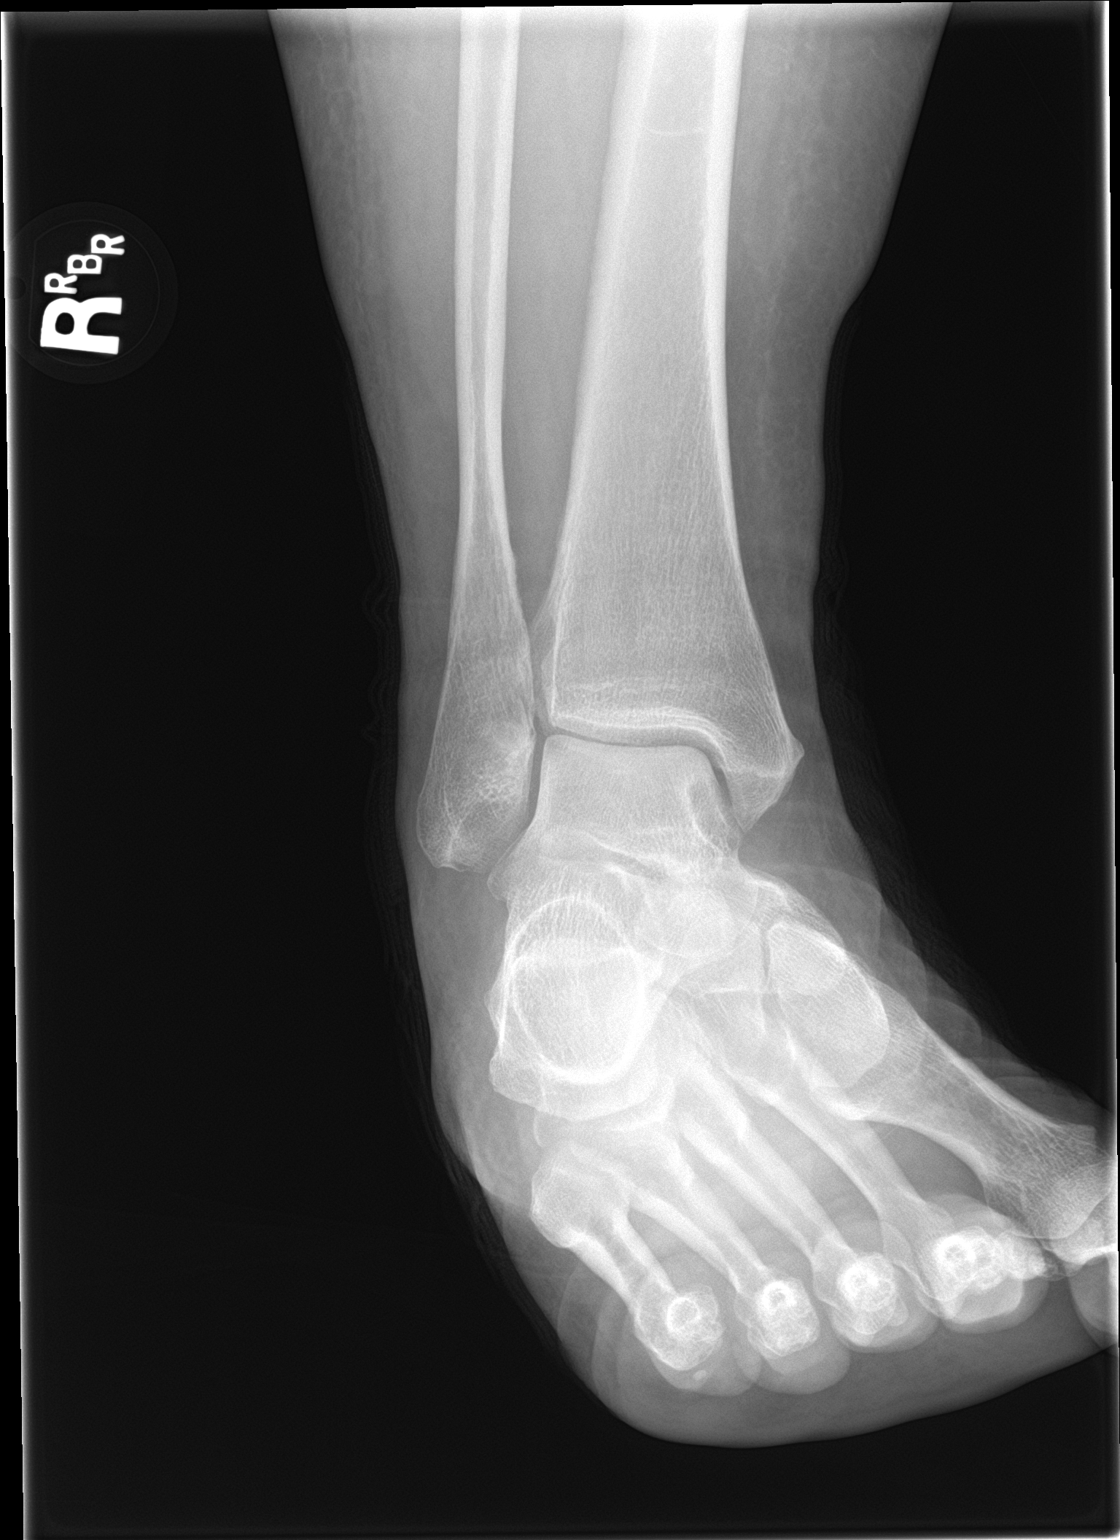

[ankle lat]
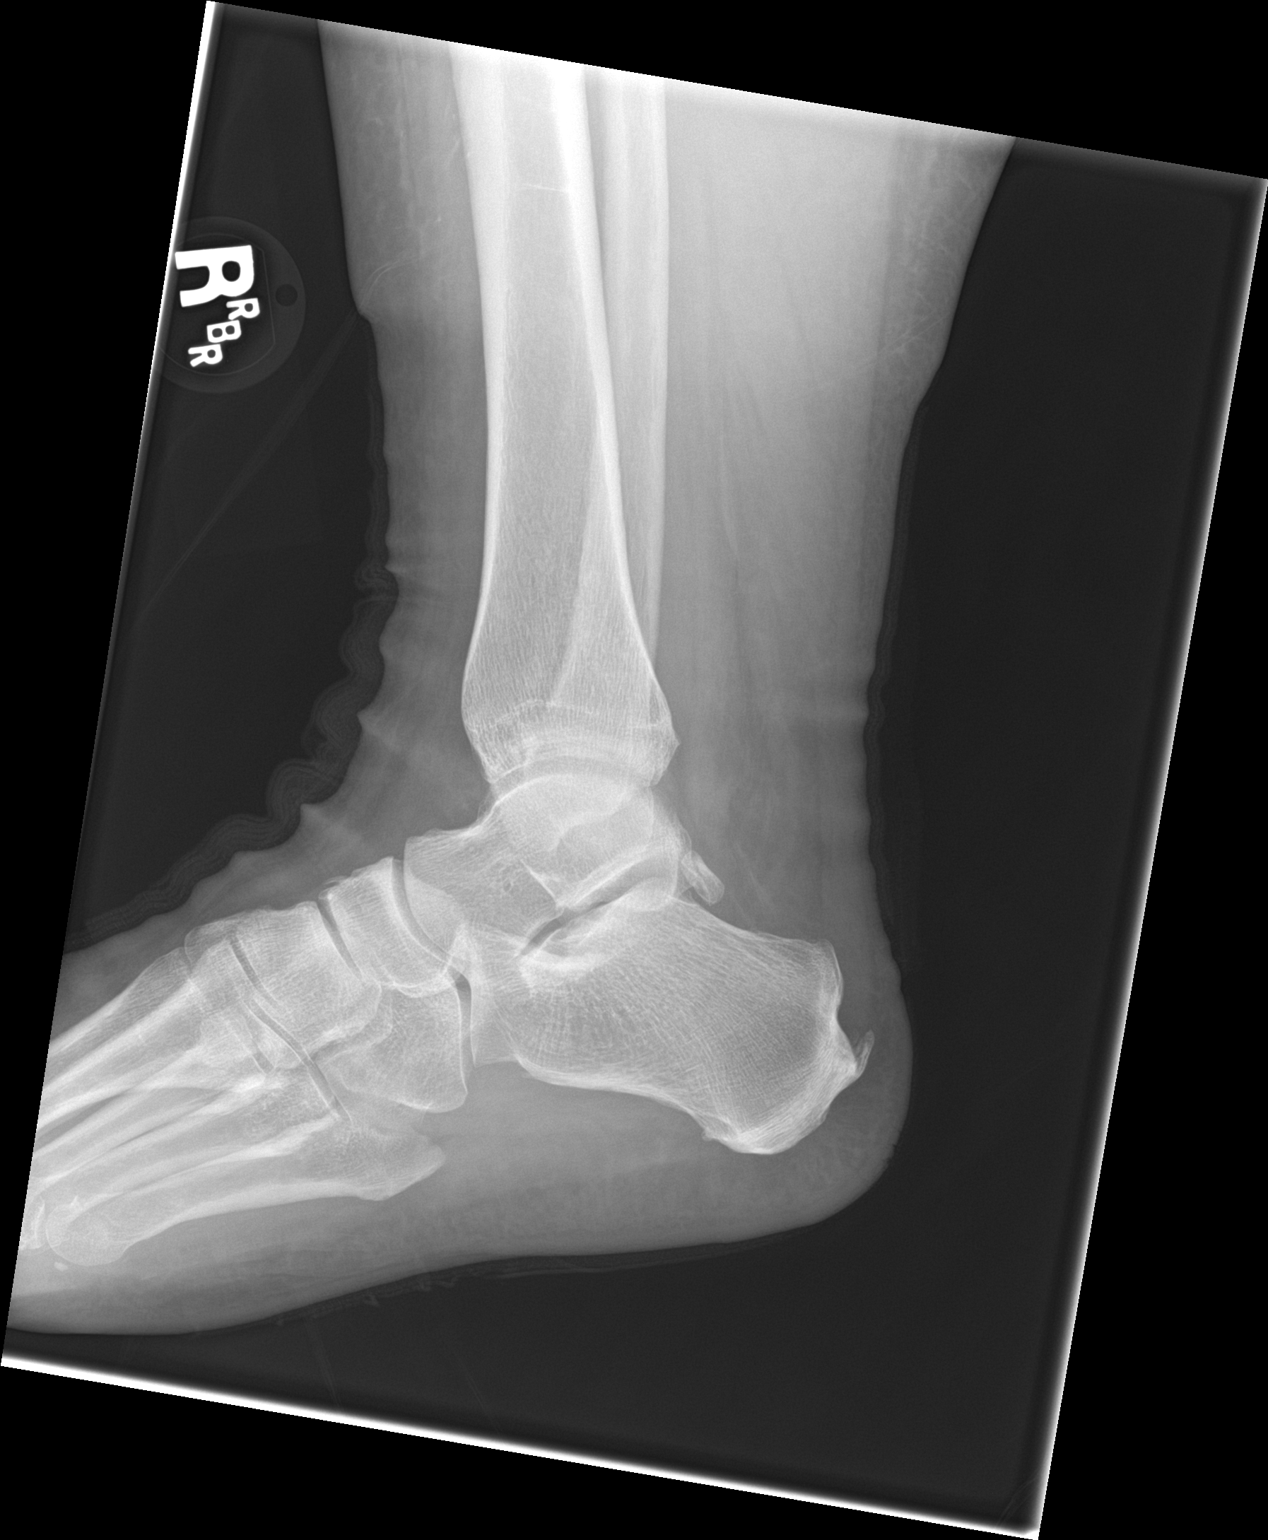

[3 of 3 positions shown; findings below may reference images not displayed]

FINDINGS: Frontal, oblique, and lateral views were obtained. There is mild
soft tissue swelling. There is no fracture or joint effusion. There
is no appreciable joint space narrowing. Ankle mortise appears
intact. There are posterior and inferior calcaneal spurs. Unfused
apophysis along the posterior talus is stable.
IMPRESSION: No demonstrable fracture. Ankle mortise appears intact. No
appreciable joint space narrowing. There are calcaneal spurs.

## 2018-08-10 ENCOUNTER — Other Ambulatory Visit: Payer: Self-pay | Admitting: Family Medicine

## 2018-08-10 DIAGNOSIS — E119 Type 2 diabetes mellitus without complications: Secondary | ICD-10-CM

## 2018-08-10 MED ORDER — INSULIN GLARGINE 100 UNIT/ML SOLOSTAR PEN: 42.0000 [IU] | PEN_INJECTOR | Freq: Every day | SUBCUTANEOUS | 11 refills | Status: DC

## 2018-08-10 MED FILL — BASAGLAR 100 UNIT/ML KWIKPE: 100 | 36 days supply | Qty: 15 | Fill #0

## 2018-08-10 NOTE — Telephone Encounter (Signed)
Change up locations she is injecting. This sometimes happens with giving self injections. Ty.

## 2018-08-10 NOTE — Telephone Encounter (Signed)
Called left detailed message 

## 2018-08-10 NOTE — Telephone Encounter (Signed)
Copied from [place] [id number]. Topic: General - Other >> Aug 10, 2018  3:05 PM Oneta Rack wrote: Relation to pt: self  Call back number: [phone] Pharmacy : [place], [place] - [address] [phone] ([phone] (Fax)     Reason for call:  Patient requesting new rx reflecting 42 units, patient in need of clarity of frequency prior to sending Rx, please advise (patient states insulin shot is given her bruises on her stomach) , please advise

## 2018-08-10 NOTE — Telephone Encounter (Signed)
Informed the patient of PCP instructions. She stated the bruising is on the side where she does not inject herself.

## 2018-08-11 ENCOUNTER — Other Ambulatory Visit: Payer: Self-pay | Admitting: Family Medicine

## 2018-08-11 NOTE — Telephone Encounter (Signed)
Called the patient left detailed message of PCP instructions.

## 2018-08-11 NOTE — Telephone Encounter (Signed)
Called left message to call back 

## 2018-08-11 NOTE — Telephone Encounter (Signed)
It shouldn't be the shots if bruising takes place in areas where she is not injecting. Might have bumped into something, we can take a look if it keeps happening. TY.

## 2018-08-16 IMAGING — DX DG TIBIA/FIBULA 2V*L*
4 series · 4 of 4 positions shown · non-contrast
Comparison: 12/16/2016

CLINICAL DATA: Fall on 12/15/2016. Still with pain, bruising and
swelling along the right anterior to lateral leg region.

EXAM:
LEFT TIBIA AND FIBULA - 2 VIEW

[tibia ap (1 of 2)]
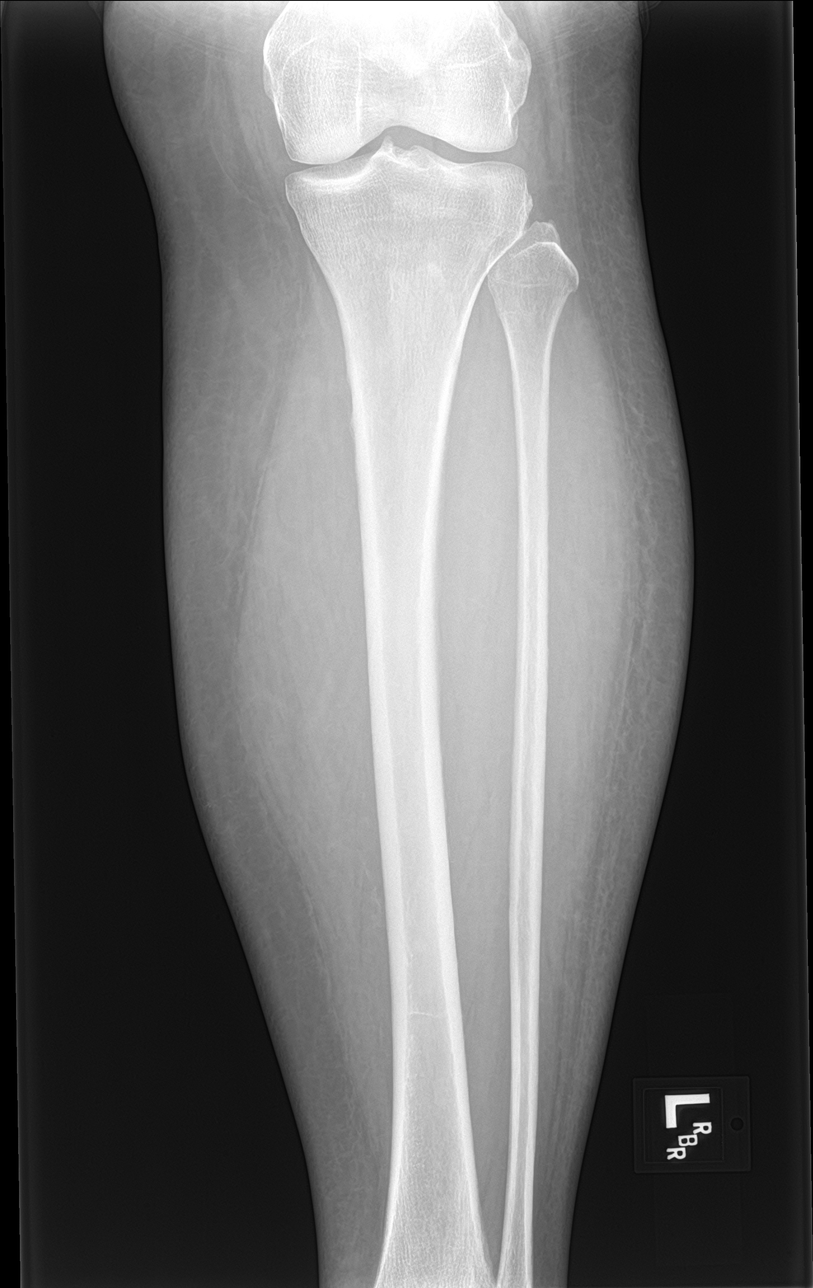

[tibia ap (2 of 2)]
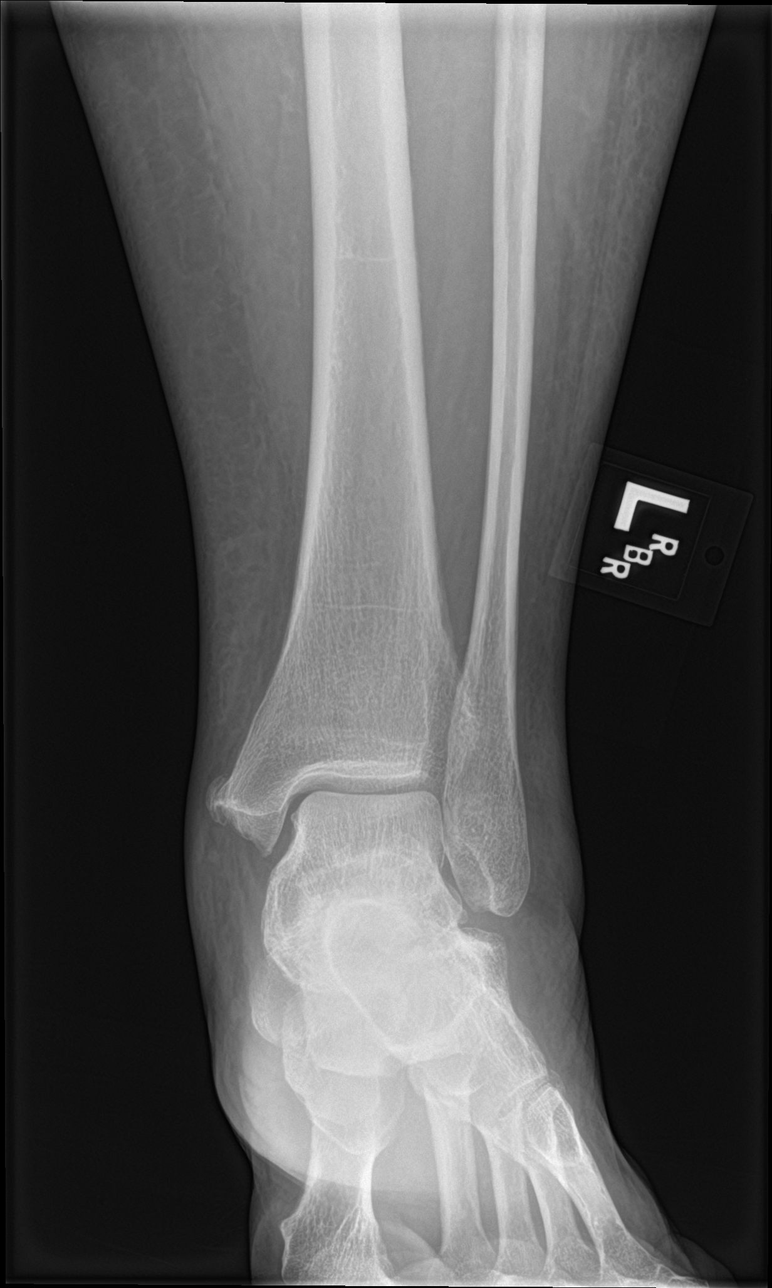

[tibia lat (1 of 2)]
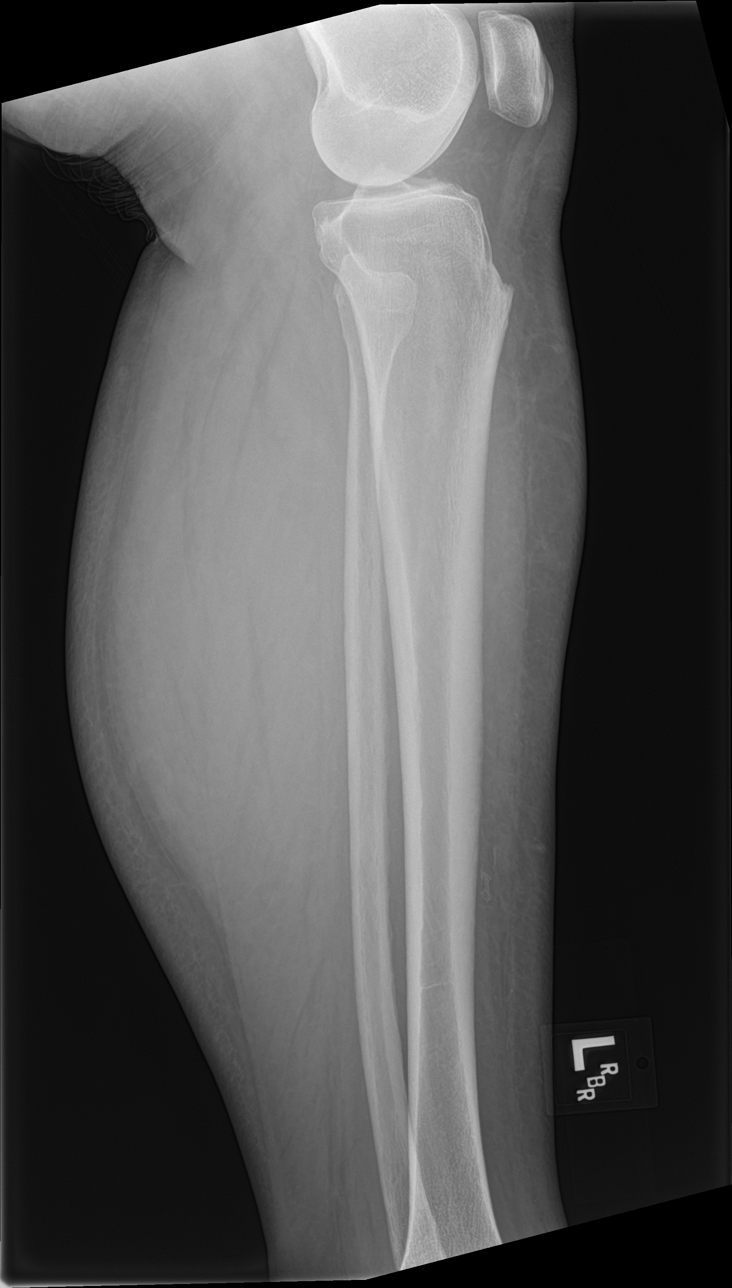

[tibia lat (2 of 2)]
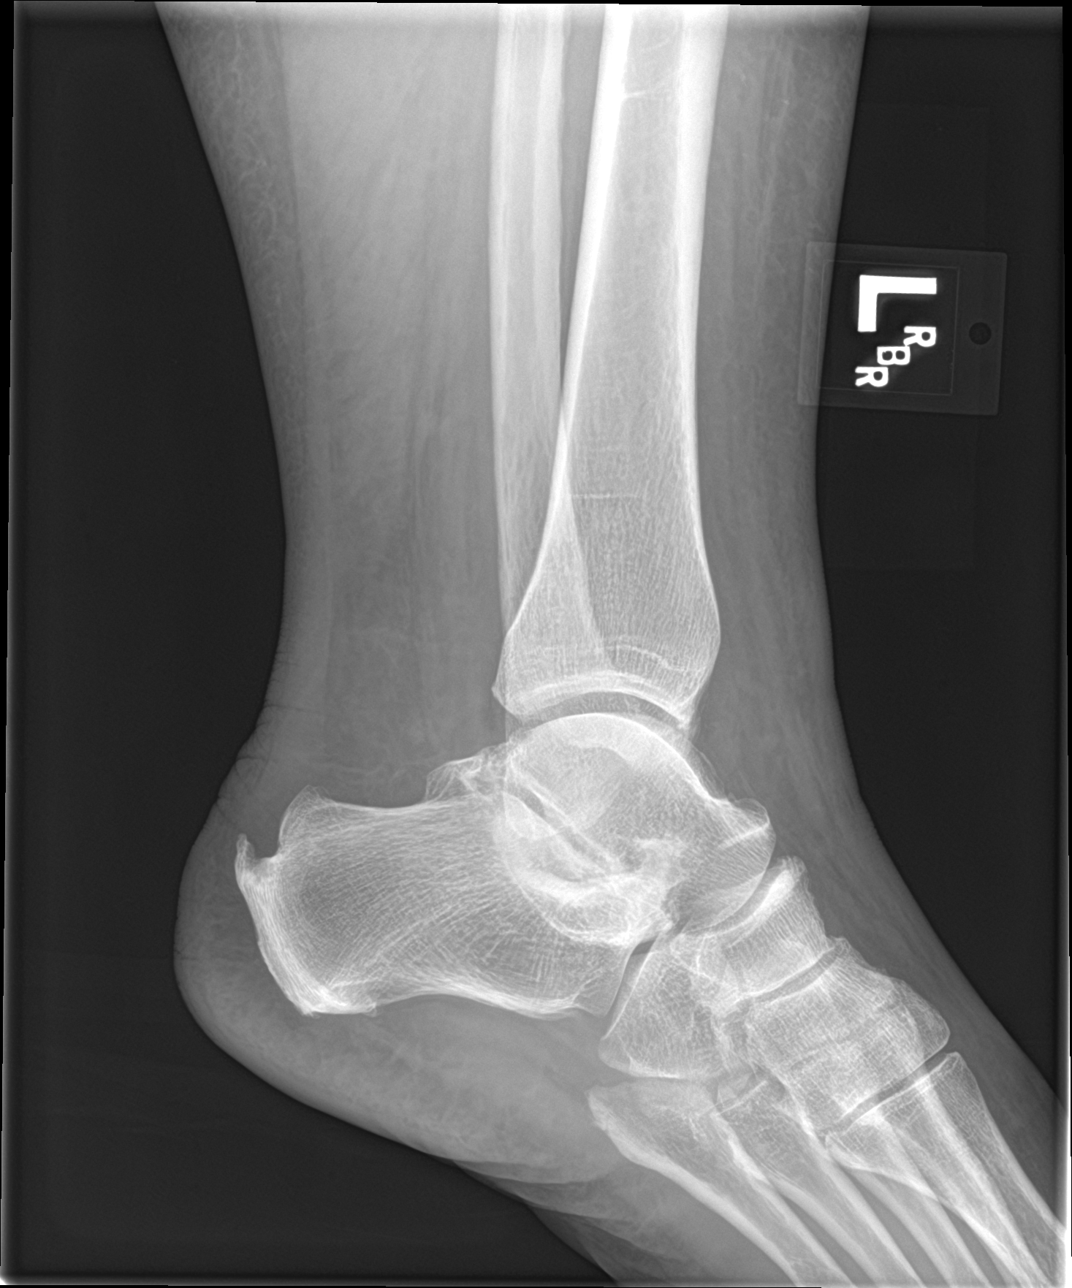

[4 of 4 positions shown; findings below may reference images not displayed]

FINDINGS: No fracture.  No bone lesion.

The knee and ankle joints are normally aligned.

There is subcutaneous soft tissue edema along the mid to distal
aspect of the leg most evident laterally. No soft tissue air or
calcification/ossification.
IMPRESSION: 1. No fracture or bone lesion.  No dislocation.
2. Nonspecific soft tissue edema.

## 2018-08-22 MED FILL — VENTOLIN HFA 90 MCG INHALER: 108 (90 BAS | 25 days supply | Qty: 18 | Fill #1

## 2018-08-24 ENCOUNTER — Other Ambulatory Visit: Payer: Self-pay | Admitting: Medical

## 2018-08-24 MED FILL — ULTICARE PEN NDL 8MM 31G: 31G X 8 MM | 90 days supply | Qty: 100 | Fill #1

## 2018-08-24 MED FILL — VICTOZA 18 MG/3 ML INJECT P: 18 | 30 days supply | Qty: 9 | Fill #1

## 2018-08-24 MED FILL — DICYCLOMINE 10 MG CAPSULE: 10 | 45 days supply | Qty: 90 | Fill #4

## 2018-08-24 MED FILL — ONDANSETRON HCL 4 MG TABLET: 4 | 10 days supply | Qty: 30 | Fill #1

## 2018-08-24 MED FILL — BASAGLAR 100 UNIT/ML KWIKPE: 100 | 36 days supply | Qty: 15 | Fill #1

## 2018-08-30 ENCOUNTER — Telehealth: Payer: Self-pay | Admitting: Family Medicine

## 2018-08-30 DIAGNOSIS — E119 Type 2 diabetes mellitus without complications: Secondary | ICD-10-CM

## 2018-08-30 NOTE — Telephone Encounter (Signed)
Copied from [place] [id number]. Topic: General - Other >> Aug 30, 2018 12:55 PM Mcneil, Ja-Kwan wrote: Reason for CRM: Pt stated she cancelled her appt via the automated system but she would like to speak with a nurse because the appt was follow up for diabetes. Pt requests that a nurse returns her call. Cb# [phone]

## 2018-08-30 NOTE — Telephone Encounter (Signed)
Phone call returned to pt. per her request.  Reported she cancelled her upcoming appt. to avoid being exposed to any viral illness.  Stated she wanted to make Dr. Nani Ravens aware of blood sugar readings, and current dose of Lantus Insulin.   Stated her blood sugars have improved.  Fasing readings are 199-225, with infreq. reading at 260.   Blood sugars during the day run 140-200.  Blood sugars at bedtime range 200-260.  Has increased her Lantus Insulin every 3rd day, nightly, as was instructed for sugars > 150 in AM.  Stated she is feeling fine, and just wanted to report this information.  Stated if Dr. Nani Ravens wants her to continue to increase her Insulin dose, she will need a new prescription.  Advised will send this information to Dr. Nani Ravens, and to give 24-72 hrs. Before expecting a call back.  Verb. Understanding.  Agreed with plan.

## 2018-08-31 ENCOUNTER — Telehealth: Payer: Self-pay

## 2018-08-31 MED ORDER — GLUCOSE BLOOD VI STRP: ORAL_STRIP | 3 refills | Status: AC

## 2018-08-31 MED ORDER — BASAGLAR KWIKPEN 100 UNIT/ML ~~LOC~~ SOPN: 54.0000 [IU] | PEN_INJECTOR | Freq: Every day | SUBCUTANEOUS | 3 refills | Status: DC

## 2018-08-31 MED ORDER — INSULIN GLARGINE 100 UNIT/ML SOLOSTAR PEN: 54.0000 [IU] | PEN_INJECTOR | Freq: Every day | SUBCUTANEOUS | 3 refills | Status: DC

## 2018-08-31 NOTE — Addendum Note (Signed)
Addended by: Sharon Seller B on: 08/31/2018 11:18 AM   Modules accepted: Orders

## 2018-08-31 NOTE — Telephone Encounter (Signed)
Sent in [place] the patient left message to call back.

## 2018-08-31 NOTE — Telephone Encounter (Signed)
Lantus not covered- preferred medication: Agricultural engineer, Levemir Flextouch, Tyler Aas Flextouch.

## 2018-08-31 NOTE — Telephone Encounter (Signed)
Patient informed. 

## 2018-08-31 NOTE — Telephone Encounter (Signed)
Can probably do an evisit in future. Continue increasing every 3rd night for now. Please find out how much she is currently taking +3 units and resend that rx with that sig, Ty.

## 2018-08-31 NOTE — Telephone Encounter (Signed)
Let's do 54 units nightly of Basaglar Kwikpen. Ty.

## 2018-08-31 NOTE — Telephone Encounter (Signed)
UPDATED MED LIST AND SENT TO PHARMACY REFILLS. THE PATIENT VERBALIZED UNDERSTANDING.

## 2018-09-01 ENCOUNTER — Ambulatory Visit: Payer: 59 | Admitting: Family Medicine

## 2018-09-27 MED FILL — BASAGLAR 100 UNIT/ML KWIKPE: 100 | 36 days supply | Qty: 15 | Fill #2

## 2018-09-27 MED FILL — VICTOZA 18 MG/3 ML INJECT P: 18 | 30 days supply | Qty: 9 | Fill #2

## 2018-09-28 MED FILL — ULTICARE PEN NDL 8MM 31G: 31G X 8 MM | 50 days supply | Qty: 100 | Fill #2

## 2018-10-10 ENCOUNTER — Telehealth: Payer: Self-pay | Admitting: Family Medicine

## 2018-10-10 NOTE — Telephone Encounter (Signed)
Last OV2/19/2020 Was due to followup 09/01/2018 -- the patient canceled due to Covid. Called the inform the patient she needs DM followup (Schedule virtual Visit) left message to call back.

## 2018-10-11 NOTE — Telephone Encounter (Signed)
Called left message to call back 

## 2018-10-12 ENCOUNTER — Other Ambulatory Visit: Payer: Self-pay

## 2018-10-12 ENCOUNTER — Ambulatory Visit (INDEPENDENT_AMBULATORY_CARE_PROVIDER_SITE_OTHER): Payer: 59 | Admitting: Family Medicine

## 2018-10-12 ENCOUNTER — Encounter: Payer: Self-pay | Admitting: Family Medicine

## 2018-10-12 DIAGNOSIS — E139 Other specified diabetes mellitus without complications: Secondary | ICD-10-CM

## 2018-10-12 MED ORDER — DEXCOM G5 RECEIVER KIT DEVI: 1.0000 | Freq: Once | 1 refills | Status: AC

## 2018-10-12 MED ORDER — DEXCOM G6 SENSOR MISC: 1.0000 | Freq: Once | 11 refills | Status: AC

## 2018-10-12 MED ORDER — DEXCOM G6 TRANSMITTER MISC: 1.0000 | Freq: Once | 4 refills | Status: AC

## 2018-10-12 NOTE — Telephone Encounter (Signed)
Scheduled her an appt today 10/12/2018

## 2018-10-12 NOTE — Progress Notes (Signed)
Subjective:   Chief Complaint  Patient presents with  . Follow-up    Diabetes  . Needs a work note to allow her to continue to work from home    Alisha Stephenson is a 53 y.o. female here for follow-up of diabetes.  Due to COVID-19 pandemic, we are interacting via web portal for an electronic face-to-face visit. I verified patient's ID using 2 identifiers. Patient agreed to proceed with visit via this method. Patient is at home, I am at office. Patient and I are present for visit.   Alisha Stephenson's self monitored glucose range is high 200's Patient denies hypoglycemic reactions. She checks her glucose levels 2 times per day. Patient does require insulin.   Medications include: Victoza 1.8 mg/d Exercise: active around house Diet is healthy  Past Medical History:  Diagnosis Date  . Asthma   . Diabetes mellitus without complication ([place])   . Diverticulitis   . Gallstones   . IBS (irritable bowel syndrome)   . NAFLD (nonalcoholic fatty liver disease)    CT scan 2015  . Ovarian cyst      Related testing: Date of retinal exam: done Pneumovax: not interested in at this time  Review of Systems: Pulmonary:  No SOB Cardiovascular:  No chest pain  Objective:  No conversational dyspnea Age appropriate judgment and insight Nml affect and mood  Assessment:   Diabetes mellitus type 1.5, managed as type 1 ([facility])   Plan:   Increase Lantus to twice daily and 30 units. Increase nighttime dose by 3 units every 3 days until consistently AM readings 140 or less. Same with AM dose based on PM reading. Try to get her Dexcom. Stop GLP-1 as I think her pancreas has given up and we need to manage her as a Type 1 diabetic. Will try to call in CGM based on this.  Counseled on diet and exercise. F/u in 1 mo. The patient voiced understanding and agreement to the plan.  [place]isha [place], DO 10/12/18 1:44 PM

## 2018-10-13 ENCOUNTER — Telehealth: Payer: Self-pay | Admitting: Family Medicine

## 2018-10-13 NOTE — Telephone Encounter (Signed)
Copied from [place] [id number]. Topic: Quick Communication - See Telephone Encounter >> Oct 13, 2018  3:05 PM Ivar Drape wrote: CRM for notification. See Telephone encounter for: 10/13/18. Patient stated she received a call from her pharmacy [facility] in [place] and they need to know if the provider want a generation 5 or 6 for the Dex Com Meter.  Please return call to [facility] with the information.

## 2018-10-14 ENCOUNTER — Telehealth: Payer: Self-pay

## 2018-10-14 NOTE — Telephone Encounter (Signed)
6 was sent in, but if 5 is cheaper/insurance will cover, we can do that. Ty.

## 2018-10-14 NOTE — Telephone Encounter (Signed)
Someone already responded yesterday

## 2018-10-14 NOTE — Telephone Encounter (Signed)
Dexcom Sensor: PA initiated via Covermymeds; KEY: [id number].   Dexcom Receiver: PA initiated via Covermymeds; KEY: [id number]. Awaiting determination.

## 2018-10-14 NOTE — Telephone Encounter (Signed)
Please advise 

## 2018-10-18 NOTE — Telephone Encounter (Signed)
PA approved for receiver- 10/14/2018 to 10/14/2019.   PA approved for sensors (3 sensors per 30 days) through 10/14/2019.

## 2018-10-25 MED FILL — BASAGLAR 100 UNIT/ML KWIKPE: 100 | 36 days supply | Qty: 15 | Fill #3

## 2018-10-30 IMAGING — MG 2D DIGITAL SCREENING BILATERAL MAMMOGRAM WITH CAD AND ADJUNCT TO
6 of 9 series · 6 of 25 positions shown · non-contrast
Comparison: Previous exam(s).

ACR Breast Density Category a: The breast tissue is almost entirely
fatty.

CLINICAL DATA: Screening.

EXAM:
2D DIGITAL SCREENING BILATERAL MAMMOGRAM WITH CAD AND ADJUNCT TOMO

[R CV]
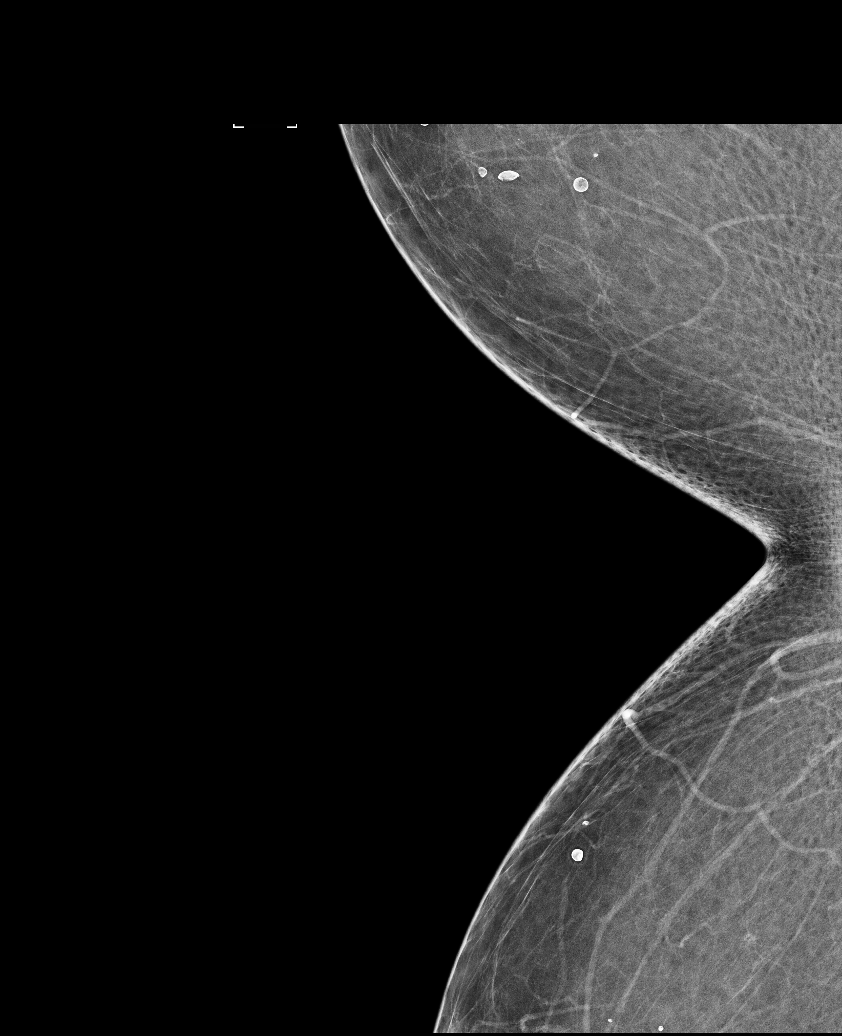

[L MLO]
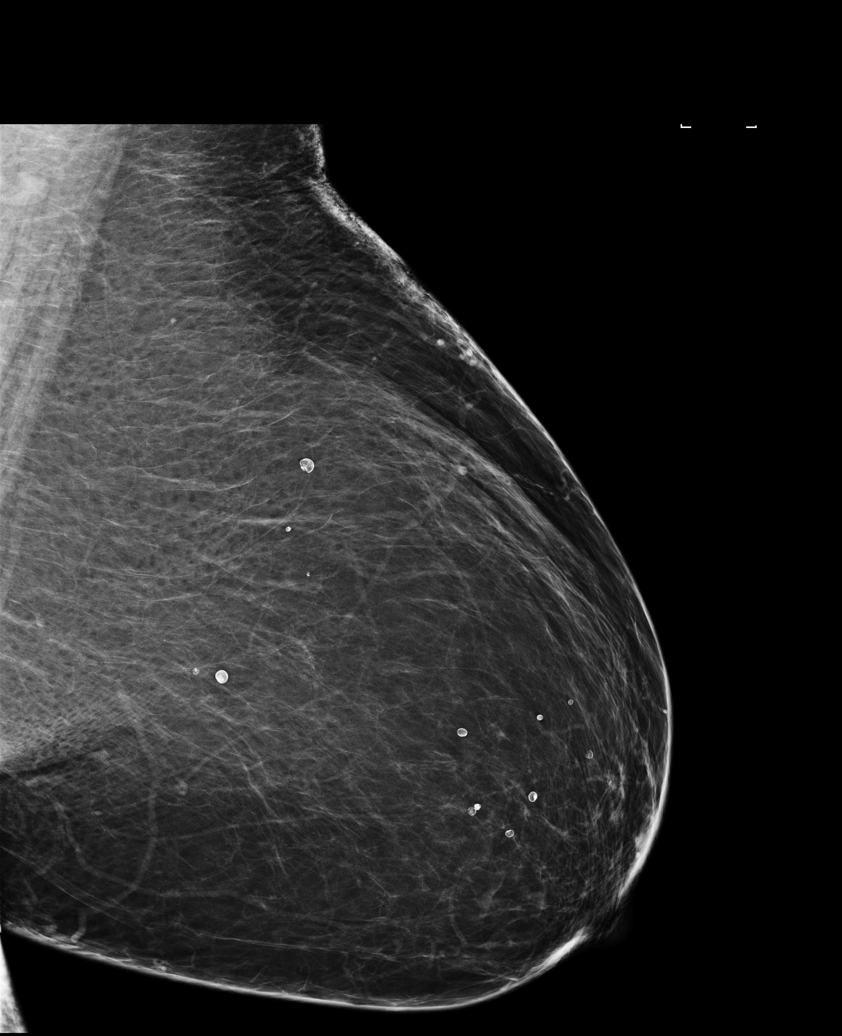

[R CC]
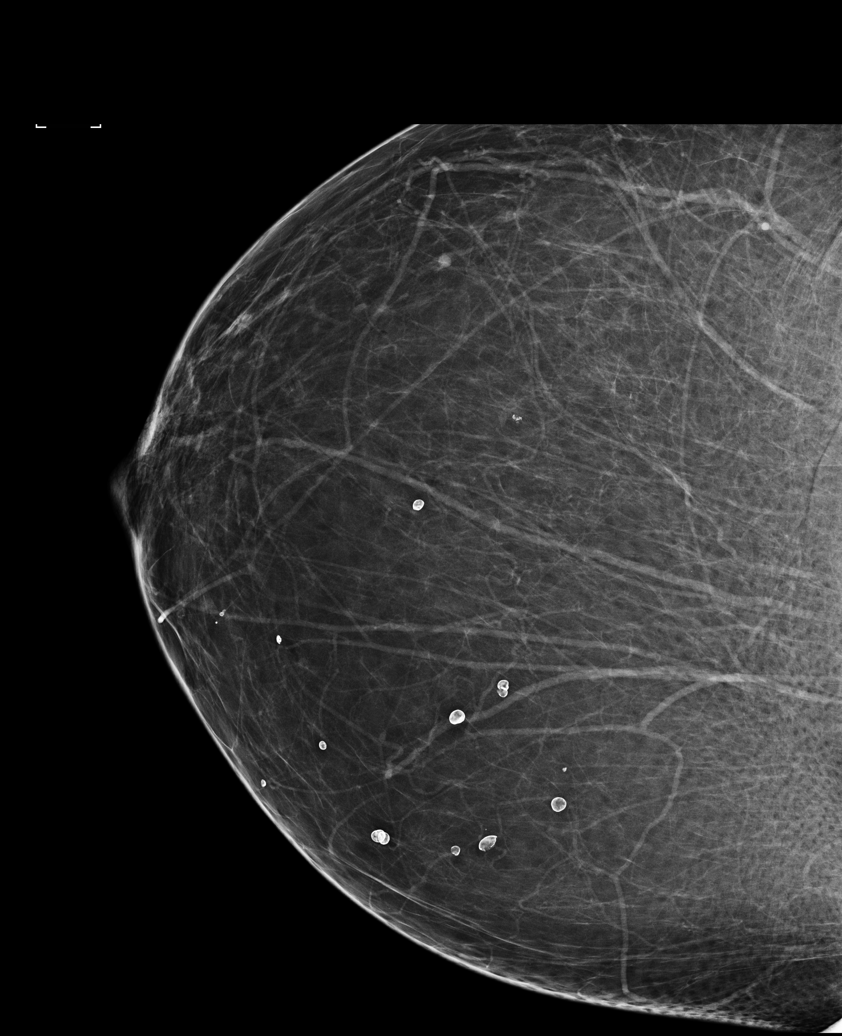

[R MLO]
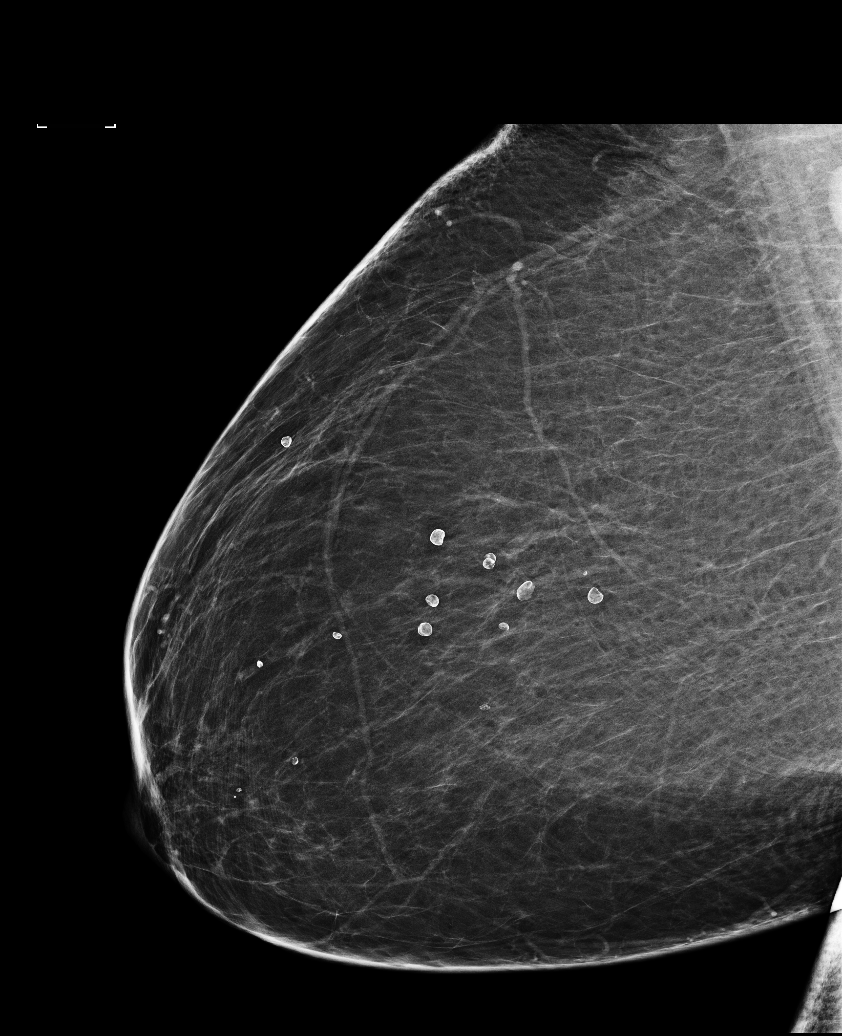

[L CC]
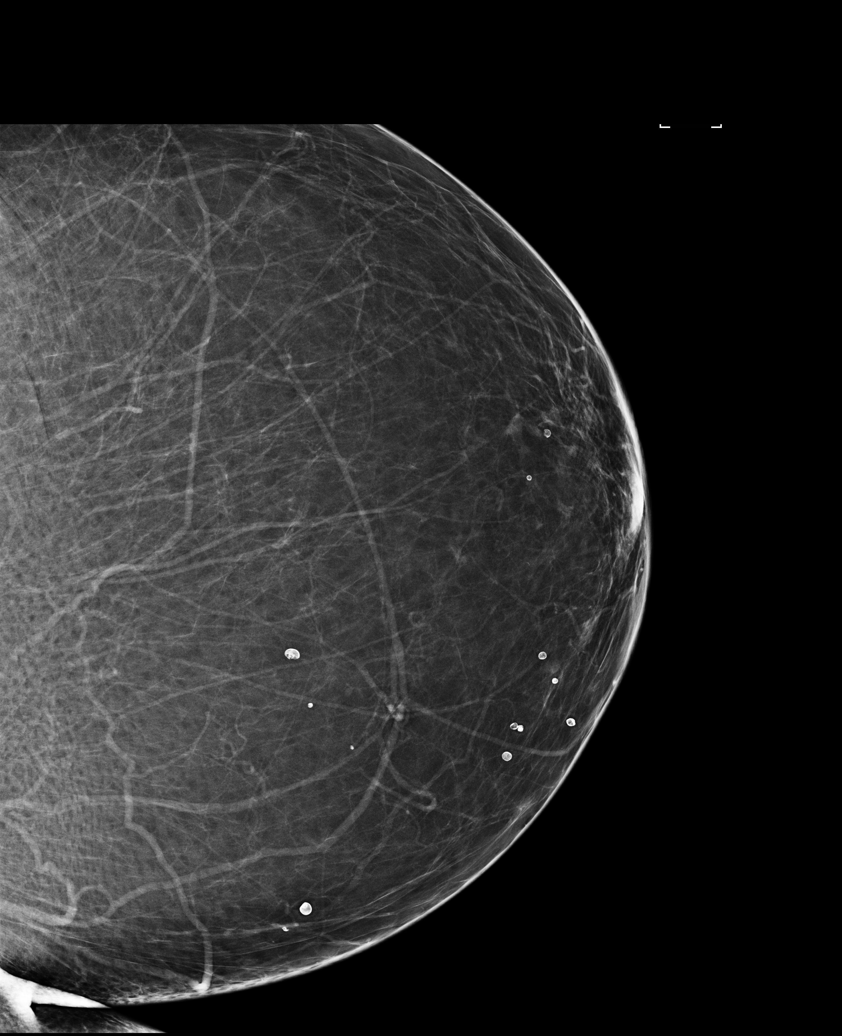

[R MLO tomo · tomo slice 49/97.0]
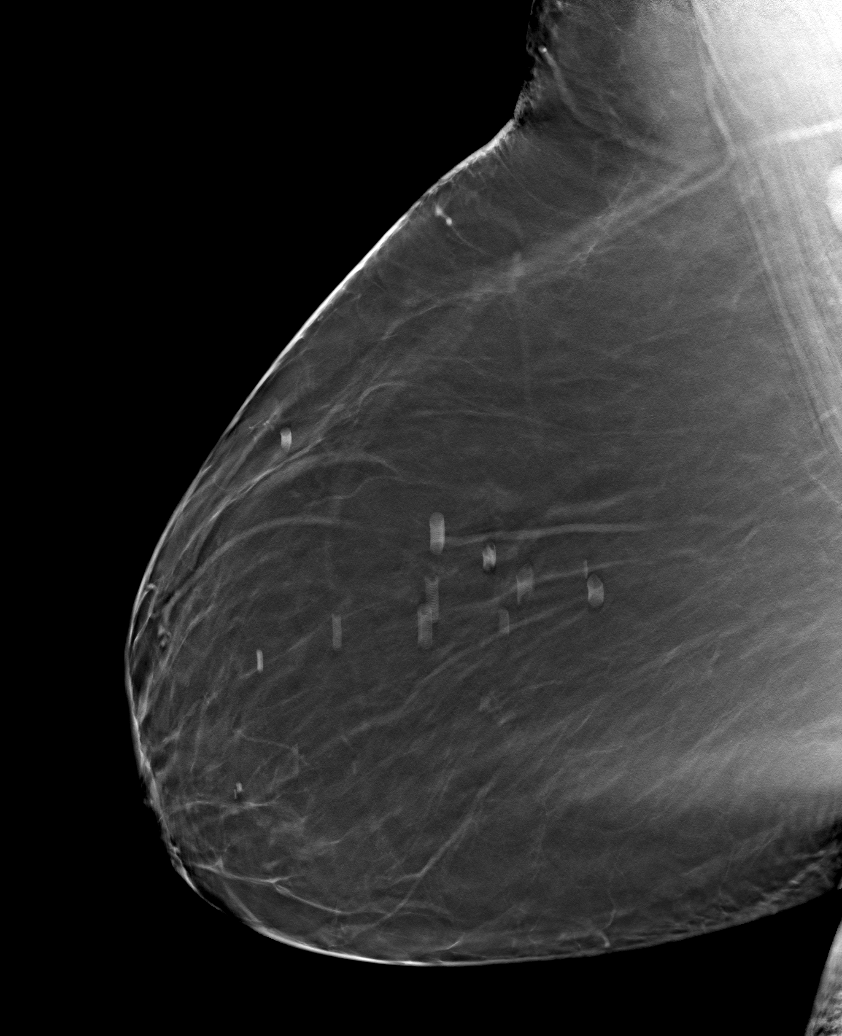

[6 of 25 positions shown; findings below may reference images not displayed]

FINDINGS: There are no findings suspicious for malignancy. Images were
processed with CAD.
IMPRESSION: No mammographic evidence of malignancy. A result letter of this
screening mammogram will be mailed directly to the patient.

RECOMMENDATION:
Screening mammogram in one year. (Code:1B-X-[id number])

BI-RADS CATEGORY  1: Negative.

## 2018-11-04 ENCOUNTER — Telehealth: Payer: Self-pay | Admitting: Family Medicine

## 2018-11-04 MED ORDER — BASAGLAR KWIKPEN 100 UNIT/ML ~~LOC~~ SOPN: 46.0000 [IU] | PEN_INJECTOR | Freq: Two times a day (BID) | SUBCUTANEOUS | 3 refills | Status: DC

## 2018-11-04 MED FILL — BASAGLAR 100 UNIT/ML KWIKPE: 100 | 89 days supply | Qty: 81 | Fill #0

## 2018-11-04 NOTE — Telephone Encounter (Signed)
Copied from [place] [id number]. Topic: General - Other >> Nov 04, 2018 11:14 AM Carolyn Stare wrote:  Pt said she has increased her insulin and now is out and would like to speak with Dr Nani Ravens nurse to discuss  Has increased up to 43 am and 43 pm-----BS 170 am ---- yesterday was 413 am----she increased to 46 units and now at 194   Needs refill to [facility] for one month>

## 2018-12-26 ENCOUNTER — Telehealth: Payer: Self-pay | Admitting: Gastroenterology

## 2018-12-26 DIAGNOSIS — K581 Irritable bowel syndrome with constipation: Secondary | ICD-10-CM

## 2018-12-26 DIAGNOSIS — R1012 Left upper quadrant pain: Secondary | ICD-10-CM

## 2018-12-26 IMAGING — US US EXTREM LOW VENOUS*L*
1 series · 13 of 24 positions shown · non-contrast
Comparison: None.

CLINICAL DATA: Left lower extremity swelling and bruising 1 week
after fall.



[Series 1: us extrem low venous*left* · 0.10mm/px · 13 of 30 slices shown]
[im 1/30]
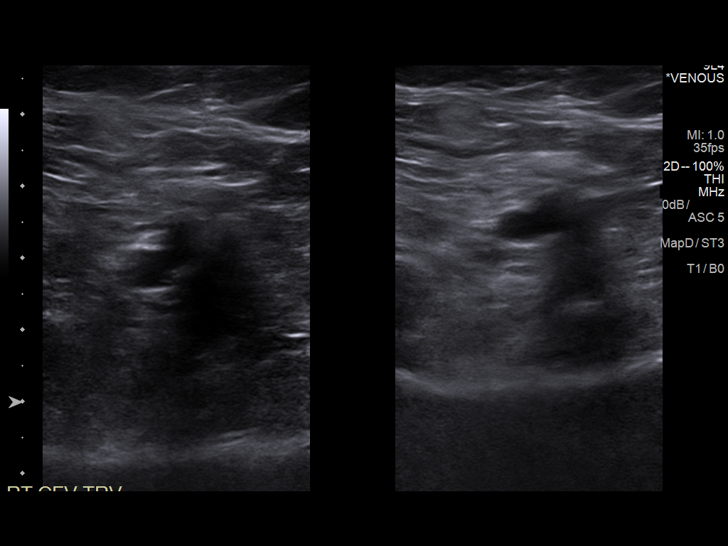
[im 3/30]
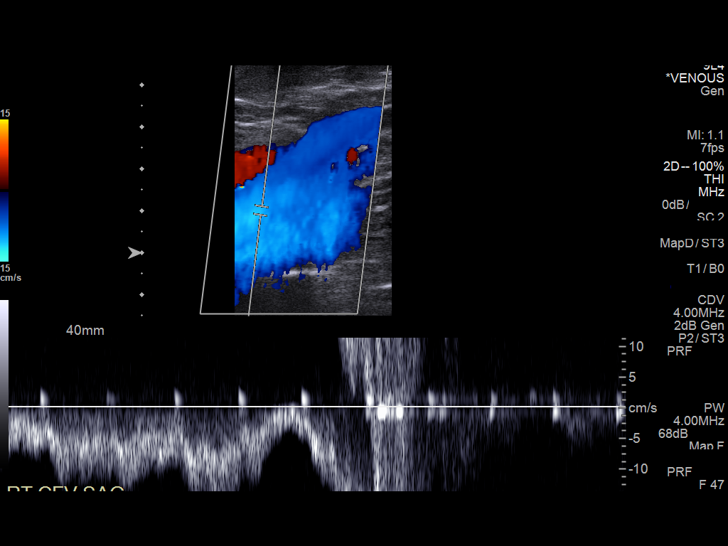
[im 6/30]
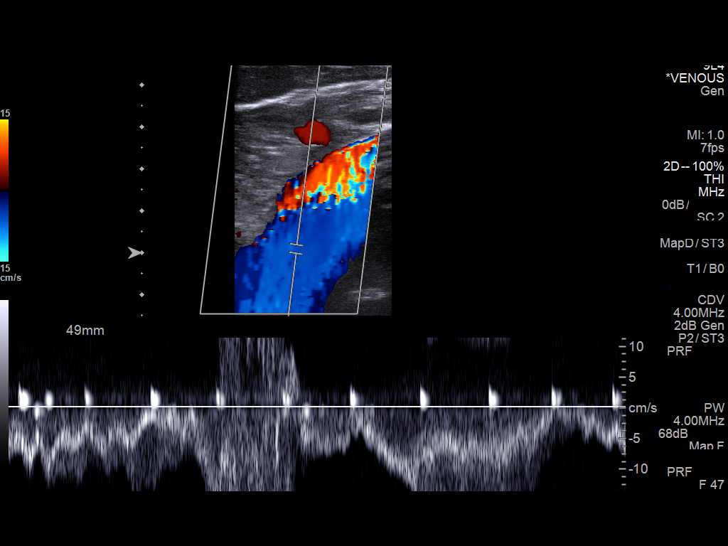
[im 8/30]
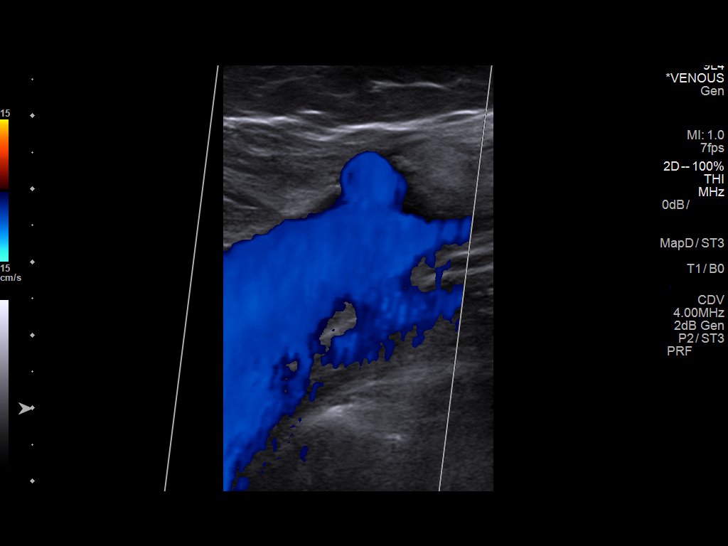
[im 11/30]
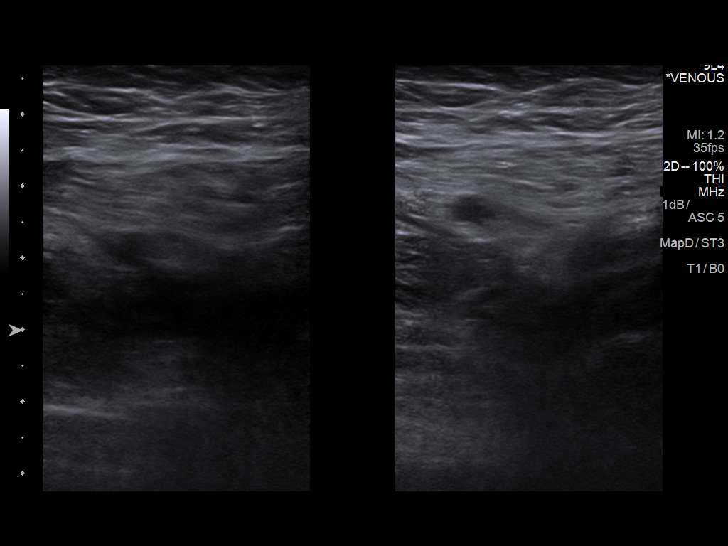
[im 13/30]
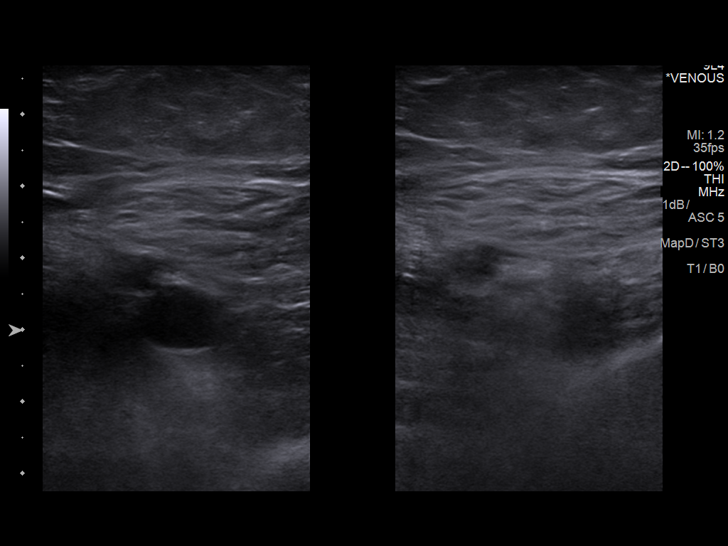
[im 16/30]
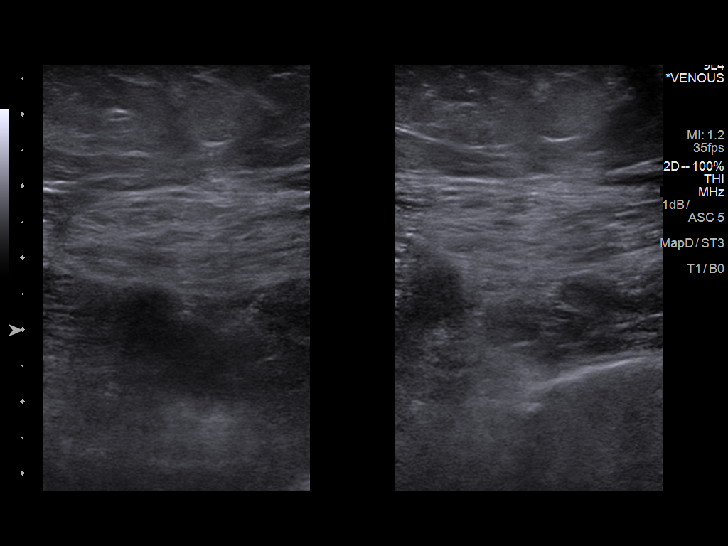
[im 17/30]
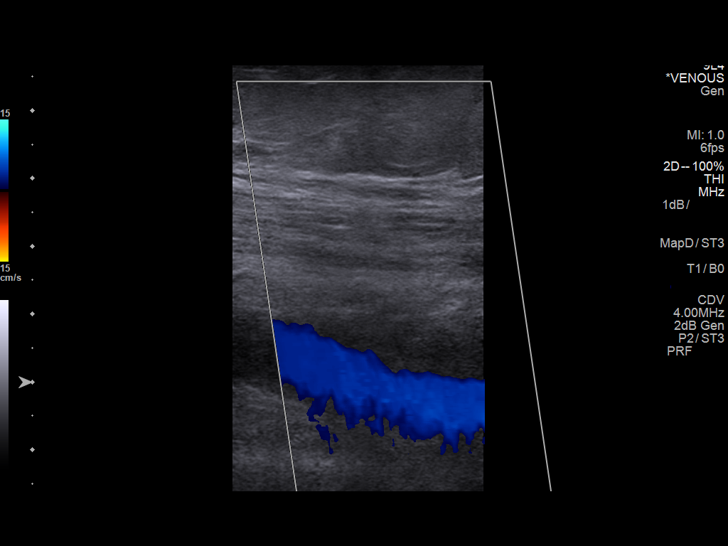
[im 19/30]
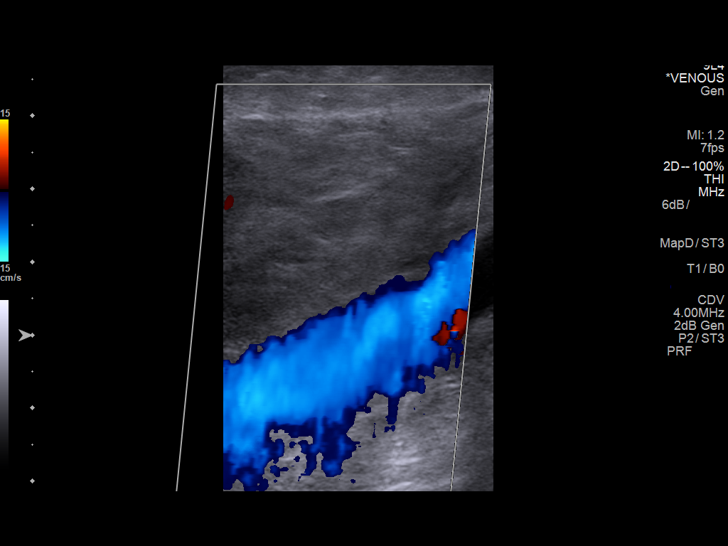
[im 22/30]
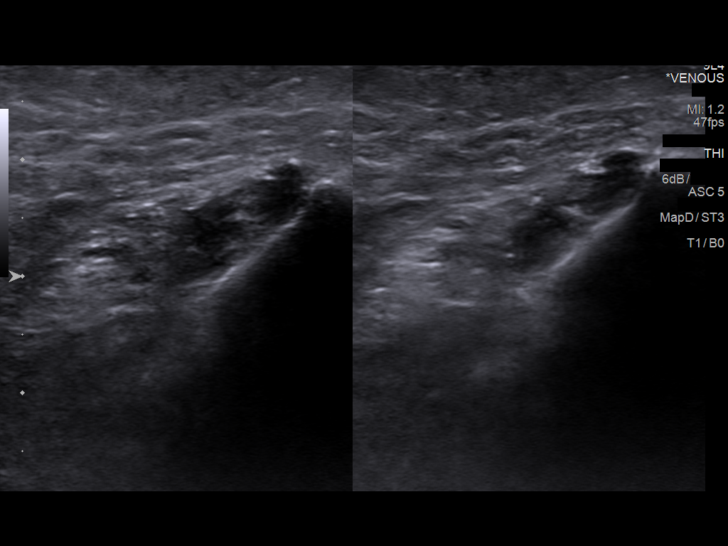
[im 24/30]
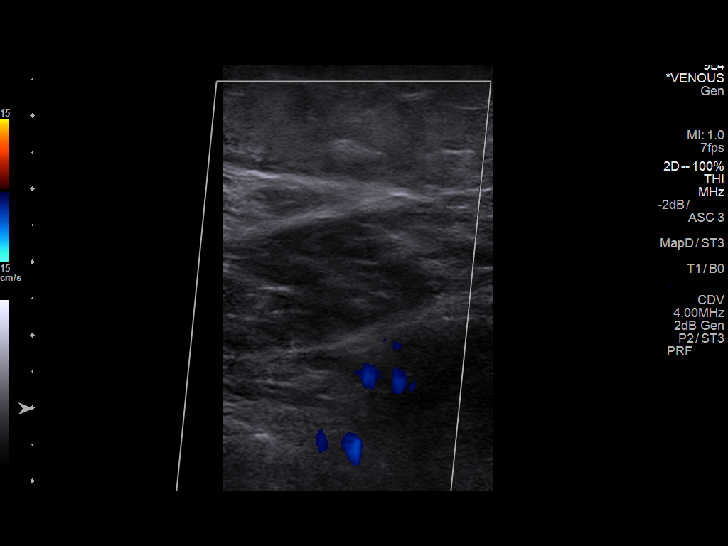
[im 27/30]
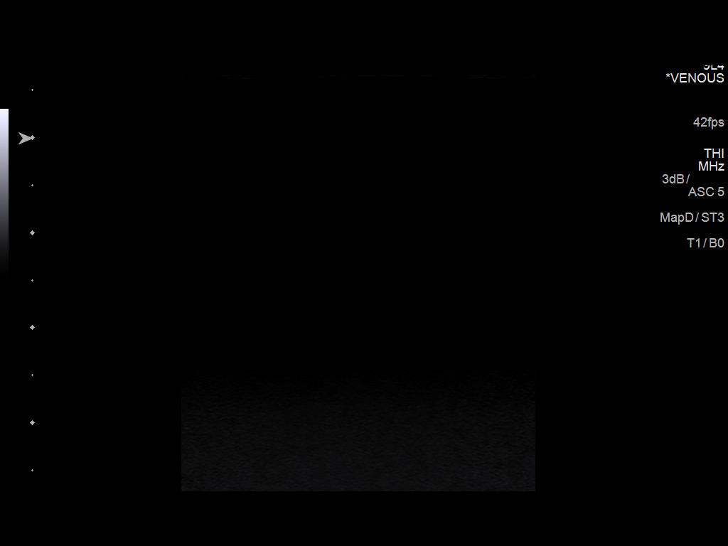
[im 30/30]
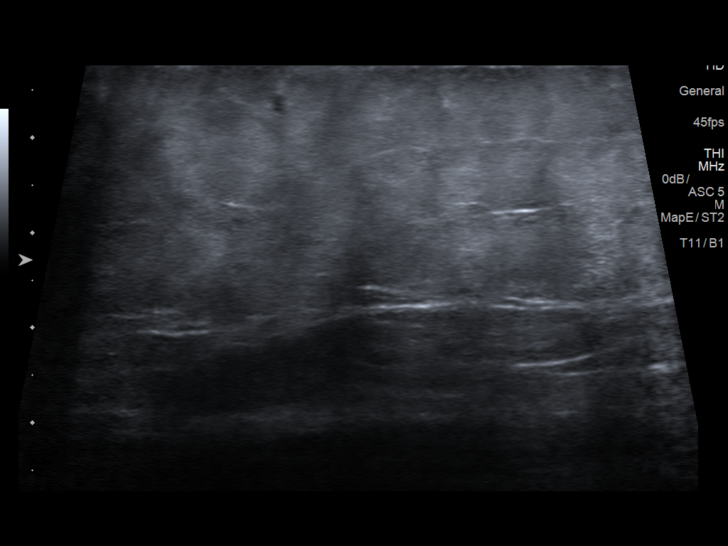

[13 of 24 positions shown; findings below may reference images not displayed]

FINDINGS: Contralateral Common Femoral Vein: Respiratory phasicity is normal
and symmetric with the symptomatic side. No evidence of thrombus.
Normal compressibility.

Common Femoral Vein: No evidence of thrombus. Normal
compressibility, respiratory phasicity and response to augmentation.

Saphenofemoral Junction: No evidence of thrombus. Normal
compressibility and flow on color Doppler imaging.

Profunda Femoral Vein: No evidence of thrombus. Normal
compressibility and flow on color Doppler imaging.

Femoral Vein: No evidence of thrombus. Normal compressibility,
respiratory phasicity and response to augmentation.

Popliteal Vein: No evidence of thrombus. Normal compressibility,
respiratory phasicity and response to augmentation.

Calf Veins: No evidence of thrombus. Normal compressibility and flow
on color Doppler imaging.

Superficial Great Saphenous Vein: No evidence of thrombus. Normal
compressibility and flow on color Doppler imaging.

Venous Reflux:  None.

Other Findings:  None.
IMPRESSION: No evidence of DVT within the left lower extremity. No soft tissue
fluid collection or hematoma identified.

## 2018-12-26 NOTE — Telephone Encounter (Signed)
Called and spoke with patient-patient reports she is having abdominal cramping/gas "rumbling in my belly", not able to fully empty out bowels, has been taking Bentyl (2 tabs) and Gas-x (2 tabs) -from alternative MD-prior to meals to assist in ; patient reports she has bowel urgency, has mucus in stool, would like to know what to do? As she feels this is a flare up She is aware she is due for a colonoscopy-  Please advise

## 2018-12-26 NOTE — Telephone Encounter (Signed)
Pt reported that she is having a severe IBS attack and requested to discuss with a nurse.

## 2018-12-26 NOTE — Telephone Encounter (Signed)
Bre  Lets -Get x-ray KUB supine and upright tomorrow morning. -Check CBC, CMP and CRP. -If any urinary symptoms, check UA also -Bentyl 10 mg p.o. 4 times daily half an hour before meals and bedtime. -MiraLAX 17 g p.o. once a day if she still has constipation. -Drink plenty of water -Agree with scheduling her for colonoscopy.  RG

## 2018-12-27 MED ORDER — DICYCLOMINE HCL 10 MG PO CAPS: 10.0000 mg | ORAL_CAPSULE | Freq: Three times a day (TID) | ORAL | 6 refills | Status: DC

## 2018-12-27 MED FILL — DICYCLOMINE 10 MG CAPSULE: 10 | 30 days supply | Qty: 120 | Fill #0

## 2018-12-27 NOTE — Telephone Encounter (Signed)
Rx sent to pharmacy of patient choice-patient made aware Rx sent;

## 2018-12-27 NOTE — Telephone Encounter (Signed)
Bentyl 10 mg p.o. 4 times daily (120), 6 refills RG

## 2018-12-27 NOTE — Telephone Encounter (Addendum)
Called and spoke with patient-patient informed of result note and MD recommendations; patient reports she is feeling a little better and would like to hold off on the lab work and xray for now-patient advised to call back to the office if symptoms worsen or do not subside; Patient verbalized understanding of information/instructions;  Patient was advised to call the office at [phone] if questions/concerns arise;  Patient is requesting a refill on Bentyl-as ordered by MD- PRN? #/RF? Please advise as RX just needs to be submitted

## 2018-12-30 ENCOUNTER — Telehealth: Payer: Self-pay | Admitting: *Deleted

## 2018-12-30 MED ORDER — BASAGLAR KWIKPEN 100 UNIT/ML ~~LOC~~ SOPN: 52.0000 [IU] | PEN_INJECTOR | Freq: Two times a day (BID) | SUBCUTANEOUS | 3 refills | Status: DC

## 2018-12-30 NOTE — Telephone Encounter (Signed)
Dr Nani Ravens -- please advise if ok to send RX with new directions below? Current med list says pt is taking 46 units twice a day.   Copied from [place] [id number]. Topic: Quick Communication - Rx Refill/Question >> Dec 29, 2018  4:46 PM Parke Poisson wrote: Medication: Insulin Glargine (BASAGLAR KWIKPEN) 100 UNIT/ML SOPN   Has the patient contacted their pharmacy? Yes  (Agent: If yes, when and what did the pharmacy advise?)Pharmacy is telling pt is is trying to get prescription to early and she is almost out. She states she takes 52 unit in AM and 52 unit in PM  Preferred Pharmacy (with phone number or street name): [place], [place] - [place] [phone] (Phone) [phone] (Fax)    Agent: Please be advised that RX refills may take up to 3 business days. We ask that you follow-up with your pharmacy.

## 2018-12-30 NOTE — Telephone Encounter (Signed)
She's right. I've been having her increase her insulin based on her readings at home. These numbers make sense. I have resent with updated sig. Ty.

## 2019-01-12 ENCOUNTER — Telehealth: Payer: Self-pay

## 2019-01-12 NOTE — Telephone Encounter (Signed)
Copied from [place] [id number]. Topic: General - Other >> Jan 12, 2019 11:56 AM Jodie Echevaria wrote: Reason for CRM: Patient called to inform Dr Nani Ravens  that her insurance is no longer paying for her Insulin Glargine (BASAGLAR KWIKPEN) 100 UNIT/ML SOPN but say that they say they will cover the Toujeo so she say she have about enough for 6 days. And at this moment her sugar is 376. Per patient she will need this please or something that will not work against her. Please call  Ph# [phone]

## 2019-01-13 ENCOUNTER — Telehealth: Payer: Self-pay | Admitting: Family Medicine

## 2019-01-13 MED ORDER — TOUJEO MAX SOLOSTAR 300 UNIT/ML ~~LOC~~ SOPN: 52.0000 [IU] | PEN_INJECTOR | Freq: Two times a day (BID) | SUBCUTANEOUS | 1 refills | Status: DC

## 2019-01-13 MED FILL — TOUJEO MAX SOLOSTAR 300 UNI: 300 | 27 days supply | Qty: 9 | Fill #0

## 2019-01-13 NOTE — Telephone Encounter (Signed)
Dylan, from [facility], called and is needing clarification on the toujeo max medication. Please advise.

## 2019-01-13 NOTE — Addendum Note (Signed)
Addended by: Sharon Seller B on: 01/13/2019 10:44 AM   Modules accepted: Orders

## 2019-01-13 NOTE — Telephone Encounter (Signed)
She wants to know if this new one is similar to the basaglar??? She is concerned of it making her sick

## 2019-01-13 NOTE — Telephone Encounter (Signed)
Please advise 

## 2019-01-13 NOTE — Telephone Encounter (Signed)
OK to call in Alisha Stephenson. I'd like to see her to discuss her sugars though. Ty.

## 2019-01-13 NOTE — Telephone Encounter (Signed)
She will discuss at appt on monday

## 2019-01-13 NOTE — Telephone Encounter (Signed)
Updated list. Called the patient informed Scheduled appt for Monday 01/16/2019.

## 2019-01-13 NOTE — Telephone Encounter (Signed)
Yes, it is still a long acting insulin.

## 2019-01-16 ENCOUNTER — Ambulatory Visit: Payer: 59 | Admitting: Family Medicine

## 2019-01-16 ENCOUNTER — Encounter: Payer: Self-pay | Admitting: Family Medicine

## 2019-01-16 ENCOUNTER — Other Ambulatory Visit: Payer: Self-pay

## 2019-01-16 VITALS — BP 122/78 | HR 87 | Temp 98.4°F | Ht 63.0 in | Wt 268.4 lb

## 2019-01-16 DIAGNOSIS — M722 Plantar fascial fibromatosis: Secondary | ICD-10-CM

## 2019-01-16 DIAGNOSIS — L989 Disorder of the skin and subcutaneous tissue, unspecified: Secondary | ICD-10-CM

## 2019-01-16 DIAGNOSIS — E139 Other specified diabetes mellitus without complications: Secondary | ICD-10-CM | POA: Diagnosis not present

## 2019-01-16 MED FILL — ULTICARE PEN NDL 8MM 31G: 31G X 8 MM | 50 days supply | Qty: 100 | Fill #3

## 2019-01-16 NOTE — Patient Instructions (Signed)
Start the insulin tonight. Increase by rules of 3 as we have been doing.  Ice/cold pack over area for 10-15 min twice daily.   Plantar Fasciitis Stretches/exercises Do exercises exactly as told by your health care provider and adjust them as directed. It is normal to feel mild stretching, pulling, tightness, or discomfort as you do these exercises, but you should stop right away if you feel sudden pain or your pain gets worse.   Stretching and range of motion exercises These exercises warm up your muscles and joints and improve the movement and flexibility of your foot. These exercises also help to relieve pain.  Exercise A: Plantar fascia stretch 1. Sit with your left / right leg crossed over your opposite knee. 2. Hold your heel with one hand with that thumb near your arch. With your other hand, hold your toes and gently pull them back toward the top of your foot. You should feel a stretch on the bottom of your toes or your foot or both. 3. Hold this stretch for 30 seconds. 4. Slowly release your toes and return to the starting position. Repeat 2 times. Complete this exercise 3 times per week.  Exercise B: Gastroc, standing 1. Stand with your hands against a wall. 2. Extend your left / right leg behind you, and bend your front knee slightly. 3. Keeping your heels on the floor and keeping your back knee straight, shift your weight toward the wall without arching your back. You should feel a gentle stretch in your left / right calf. 4. Hold this position for 30 seconds. Repeat 2 times. Complete this exercise 3 times a week. Exercise C: Soleus, standing 1. Stand with your hands against a wall. 2. Extend your left / right leg behind you, and bend your front knee slightly. 3. Keeping your heels on the floor, bend your back knee and slightly shift your weight over the back leg. You should feel a gentle stretch deep in your calf. 4. Hold this position for 30 seconds. Repeat 2 times. Complete  this exercise 3 times per week. Exercise D: Gastrocsoleus, standing 1. Stand with the ball of your left / right foot on a step. The ball of your foot is on the walking surface, right under your toes. 2. Keep your other foot firmly on the same step. 3. Hold onto the wall or a railing for balance. 4. Slowly lift your other foot, allowing your body weight to press your heel down over the edge of the step. You should feel a stretch in your left / right calf. 5. Hold this position for 30 seconds. 6. Return both feet to the step. 7. Repeat this exercise with a slight bend in your left / right knee. Repeat 2 times with your left / right knee straight and 2times with your left / right knee bent. Complete this exercise 3 times a week.  Balance exercise This exercise builds your balance and strength control of your arch to help take pressure off your plantar fascia. Exercise E: Single leg stand 1. Without shoes, stand near a railing or in a doorway. You may hold onto the railing or door frame as needed. 2. Stand on your left / right foot. Keep your big toe down on the floor and try to keep your arch lifted. Do not let your foot roll inward. 3. Hold this position for 30 seconds. 4. If this exercise is too easy, you can try it with your eyes closed or while standing on  a pillow. Repeat 2 times. Complete this exercise 3 times per week. This information is not intended to replace advice given to you by your health care provider. Make sure you discuss any questions you have with your health care provider. Document Released: 05/25/2005 Document Revised: 01/28/2016 Document Reviewed: 04/08/2015 Elsevier Interactive Patient Education  2017 [employer].

## 2019-01-16 NOTE — Progress Notes (Signed)
Subjective:   Chief Complaint  Patient presents with  . Follow-up    Diabetes    Alisha Stephenson is a 53 y.o. female here for follow-up of diabetes.   Alisha Stephenson's self monitored glucose range is mid 200's.  Patient denies hypoglycemic reactions. She checks her glucose levels 2 times per day. Patient does require insulin- Toujeo 52 u BID.   Medications include: no PO meds, she is though to be a type 1 convert Exercise: some walking  Has a skin lesion on her L shoulder that is a little darker. She was outside recently no change in size.   L foot pain, told it is PF. Worse at heal, started after she had her LLE surgery.   Past Medical History:  Diagnosis Date  . Asthma   . Diabetes mellitus without complication ([place])   . Diverticulitis   . Gallstones   . IBS (irritable bowel syndrome)   . NAFLD (nonalcoholic fatty liver disease)    CT scan 2015  . Ovarian cyst      Related testing: Date of retinal exam: Done Pneumovax: Going to get this fall  Review of Systems: Pulmonary:  No SOB Cardiovascular:  No chest pain  Objective:  BP 122/78 (BP Location: Left Arm, Patient Position: Sitting, Cuff Size: Large)   Pulse 87   Temp 98.4 F (36.9 C) (Oral)   Ht 5\' 3"  (1.6 m)   Wt 268 lb 6 oz (121.7 kg)   SpO2 98%   BMI 47.54 kg/m  General:  Well developed, well nourished, in no apparent distress Skin:  Warm, no pallor or diaphoresis; there is an ovoid and raised lesions that is hyperpigmented and with crypts over the L shoulder. No fluctuance, erythema, scaling or ttp.  Head:  Normocephalic, atraumatic Eyes:  Pupils equal and round, sclera anicteric without injection  Lungs:  CTAB, no access msc use Cardio:  RRR, no bruits, no LE edema Musculoskeletal:  Symmetrical muscle groups noted without atrophy or deformity; +ttp over prox plantar surface Neuro:  Sensation intact to pinprick on feet Psych: Age appropriate judgment and insight  Assessment:   Diabetes mellitus type 1.5,  managed as type 1 ([place]) - Plan: HM DIABETES FOOT EXAM, Hemoglobin A1c, CBC, may need mealtime insulin. Cont to increase. Check mealtime sugars, check in after 2 weeks of readings.   Skin lesion - Plan: monitor. If it gets lighter w less sun exposure, likely SK. If no change or darkening, will do shave biopsy  Plantar fasciitis - Plan: ice, NSAIDs, Tylenol, stretches/exercises   Plan:   Orders as above. Counseled on diet and exercise. F/u in 6 weeks to reck. The patient voiced understanding and agreement to the plan.  [place], DO 01/16/19 4:36 PM \

## 2019-01-17 LAB — CBC
HCT: 43.5 % (ref 36.0–46.0)
Hemoglobin: 14.5 g/dL (ref 12.0–15.0)
MCHC: 33.4 g/dL (ref 30.0–36.0)
MCV: 88.6 fl (ref 78.0–100.0)
Platelets: 162 10*3/uL (ref 150.0–400.0)
RBC: 4.91 Mil/uL (ref 3.87–5.11)
RDW: 13.3 % (ref 11.5–15.5)
WBC: 7.8 10*3/uL (ref 4.0–10.5)

## 2019-01-17 LAB — HEMOGLOBIN A1C: Hgb A1c MFr Bld: 10.3 % — ABNORMAL HIGH (ref 4.6–6.5)

## 2019-01-30 ENCOUNTER — Encounter: Payer: Self-pay | Admitting: Family Medicine

## 2019-01-30 ENCOUNTER — Other Ambulatory Visit: Payer: Self-pay

## 2019-01-30 ENCOUNTER — Ambulatory Visit (INDEPENDENT_AMBULATORY_CARE_PROVIDER_SITE_OTHER): Payer: 59 | Admitting: Family Medicine

## 2019-01-30 DIAGNOSIS — E139 Other specified diabetes mellitus without complications: Secondary | ICD-10-CM | POA: Diagnosis not present

## 2019-01-30 MED ORDER — INSULIN LISPRO (1 UNIT DIAL) 100 UNIT/ML (KWIKPEN): 6.0000 [IU] | PEN_INJECTOR | Freq: Three times a day (TID) | SUBCUTANEOUS | 11 refills | Status: DC

## 2019-01-30 MED FILL — HUMALOG 100 UNITS/ML KWIKPE: 100 | 83 days supply | Qty: 15 | Fill #0

## 2019-01-30 NOTE — Progress Notes (Signed)
Chief Complaint  Patient presents with  . Blood Sugar Problem    Subjective: Patient is a 53 y.o. female here for f/u DM. Due to COVID-19 pandemic, we are interacting via web portal for an electronic face-to-face visit. I verified patient's ID using 2 identifiers. Patient agreed to proceed with visit via this method. Patient is at home, I am at office. Patient and I are present for visit.   Patient is currently taking 62 units of Toujeo twice daily.  She reports her diet has improved since seeing the nutritionist.  She is trying to walk daily.  Her morning sugars are in the mid 200s.  Mealtime sugars range between high 200s and 300s.  She had been very well controlled until late 2019 where we think her pancreas stopped making as much insulin.  ROS: Endo: No hypoglycemia.   Past Medical History:  Diagnosis Date  . Asthma   . Diabetes mellitus without complication ([place])   . Diverticulitis   . Gallstones   . IBS (irritable bowel syndrome)   . NAFLD (nonalcoholic fatty liver disease)    CT scan 2015  . Ovarian cyst     Objective: No conversational dyspnea Age appropriate judgment and insight Nml affect and mood  Assessment and Plan: Diabetes mellitus type 1.5, managed as type 1 ([place]) - Plan: insulin lispro (HUMALOG) 100 UNIT/ML KwikPen  Continue long-acting insulin 60 units twice daily Toujeo.  We will add mealtime insulin.  She will check her sugars 4 times daily.  6 units per meal, after 7 days she is to send [place] her sugar readings.  Hold off on sliding scale until we see how she does. The patient voiced understanding and agreement to the plan.  [place], DO 01/30/19  4:41 PM

## 2019-02-01 ENCOUNTER — Telehealth: Payer: Self-pay

## 2019-02-01 MED ORDER — TOUJEO MAX SOLOSTAR 300 UNIT/ML ~~LOC~~ SOPN: 62.0000 [IU] | PEN_INJECTOR | Freq: Two times a day (BID) | SUBCUTANEOUS | 3 refills | Status: DC

## 2019-02-01 MED FILL — TOUJEO MAX SOLOSTAR 300 UNI: 300 | 30 days supply | Qty: 12 | Fill #0

## 2019-02-01 NOTE — Telephone Encounter (Signed)
Copied from [place] [id number]. Topic: General - Inquiry >> Feb 01, 2019 10:21 AM Richardo Priest, NT wrote: Reason for CRM: Patient called in stating she would like the 62 units 2x a day of the [facility] sent in. Patient stated she only has 1 pen left. Patient also stated that this would be an adjustment from the 52 units that she is taking 3x a day. Please advise. Patient wanting script sent to [facility] outpatient pharmacy.

## 2019-02-01 NOTE — Telephone Encounter (Signed)
Patient informed. 

## 2019-02-09 ENCOUNTER — Other Ambulatory Visit: Payer: Self-pay | Admitting: Family Medicine

## 2019-02-09 ENCOUNTER — Encounter: Payer: Self-pay | Admitting: Family Medicine

## 2019-02-09 DIAGNOSIS — E139 Other specified diabetes mellitus without complications: Secondary | ICD-10-CM

## 2019-02-16 ENCOUNTER — Encounter: Payer: Self-pay | Admitting: Family Medicine

## 2019-02-27 ENCOUNTER — Other Ambulatory Visit: Payer: Self-pay | Admitting: Family Medicine

## 2019-02-27 ENCOUNTER — Other Ambulatory Visit: Payer: Self-pay

## 2019-02-27 ENCOUNTER — Encounter: Payer: Self-pay | Admitting: Family Medicine

## 2019-02-27 ENCOUNTER — Ambulatory Visit (INDEPENDENT_AMBULATORY_CARE_PROVIDER_SITE_OTHER): Payer: PRIVATE HEALTH INSURANCE | Admitting: Family Medicine

## 2019-02-27 VITALS — BP 120/68 | HR 112 | Temp 97.6°F | Wt 273.0 lb

## 2019-02-27 DIAGNOSIS — L570 Actinic keratosis: Secondary | ICD-10-CM | POA: Diagnosis not present

## 2019-02-27 DIAGNOSIS — E139 Other specified diabetes mellitus without complications: Secondary | ICD-10-CM | POA: Diagnosis not present

## 2019-02-27 DIAGNOSIS — D489 Neoplasm of uncertain behavior, unspecified: Secondary | ICD-10-CM

## 2019-02-27 MED ORDER — TOUJEO MAX SOLOSTAR 300 UNIT/ML ~~LOC~~ SOPN: 62.0000 [IU] | PEN_INJECTOR | Freq: Two times a day (BID) | SUBCUTANEOUS | 3 refills | Status: DC

## 2019-02-27 MED ORDER — INSULIN LISPRO (1 UNIT DIAL) 100 UNIT/ML (KWIKPEN): 15.0000 [IU] | PEN_INJECTOR | Freq: Three times a day (TID) | SUBCUTANEOUS | 11 refills | Status: DC

## 2019-02-27 MED ORDER — ONDANSETRON HCL 4 MG PO TABS: 4.0000 mg | ORAL_TABLET | Freq: Three times a day (TID) | ORAL | 1 refills | Status: DC | PRN

## 2019-02-27 NOTE — Patient Instructions (Addendum)
Do not shower for the rest of the day. When you do wash it, use only soap and water. Do not vigorously scrub. Apply triple antibiotic ointment (like Neosporin) twice daily. Keep the area clean and dry.   Things to look out for: increasing pain not relieved by ibuprofen/acetaminophen, fevers, spreading redness, drainage of pus, or foul odor.  Give [place] 1 business week to get the results of your labs back.  Let's do 15 units of Humalog with meals. Continue to check your sugars at home. Start counting carbs. Send me a message with your sugar readings in 2 weeks.   Keep the diet clean and stay active.  Let us know if you need anything.

## 2019-02-27 NOTE — Progress Notes (Addendum)
Chief Complaint  Patient presents with  . Follow-up    diabetes    Subjective: Patient is a 52 y.o. female here for f/u DM.  Sugars improving to low-mid 200's. Diet/exercise unchanged. Taking 10 u Humalog w every meal. No lows. 62 u Toujeo bid. Cannot seen endo as her out of pocket cost is too high.   Having a lesion on her L shoulder that is growing and starting to become painful. No new topicals. +famhx of skin cancer.   ROS: Endo: No hypoglycemia Skin: +lesion  Past Medical History:  Diagnosis Date  . Asthma   . Diabetes mellitus without complication ([place])   . Diverticulitis   . Gallstones   . IBS (irritable bowel syndrome)   . NAFLD (nonalcoholic fatty liver disease)    CT scan 2015  . Ovarian cyst     Objective: BP 120/68 (BP Location: Left Arm, Patient Position: Sitting, Cuff Size: Large)   Pulse (!) 112   Temp 97.6 F (36.4 C) (Temporal)   Wt 273 lb (123.8 kg)   SpO2 97%   BMI 48.36 kg/m  General: Awake, appears stated age Skin: See below Lungs: No accessory muscle use Psych: Age appropriate judgment and insight, normal affect and mood   L trap area  Procedure note; shave biopsy Informed consent was obtained. The area around lesion measuring 1.1 cm x 0.6 cm was cleaned with alcohol and injected with 2 mL of 1% lidocaine with epinephrine. A Dermablade was slightly bent and used to cut under the area of interest. The entire specimen was placed in a sterile specimen cup and sent to the lab. The area was then cauterized ensuring adequate hemostasis. The area was dressed with triple antibiotic ointment and a bandage. There were no complications noted. The patient tolerated the procedure well.  Assessment and Plan: Neoplasm of uncertain behavior - Plan: Dermatology pathology([place])  Diabetes mellitus type 1.5, managed as type 1 ([place]) - Plan: insulin lispro (HUMALOG) 100 UNIT/ML KwikPen  Biopsy today. Aftercare instructions verbalized and written  down. Increase Humalog to 15 u TID, start counting carbs. Send me readings in 2 weeks. Leave Toujeo alone. Counseled on diet and exercise. F/u in 7 weeks, will ck A1c. The patient voiced understanding and agreement to the plan.  [place], DO 02/27/19  12:27 PM

## 2019-04-05 ENCOUNTER — Telehealth: Payer: Self-pay

## 2019-04-05 NOTE — Telephone Encounter (Signed)
PA initiated via Covermymeds; KEY: [id number]. Awaiting determination.

## 2019-04-05 NOTE — Telephone Encounter (Signed)
PA approved through 04/07/2020.

## 2019-04-06 MED FILL — TOUJEO MAX SOLOSTAR 300 UNI: 300 | 29 days supply | Qty: 12 | Fill #1

## 2019-04-14 ENCOUNTER — Other Ambulatory Visit: Payer: Self-pay

## 2019-04-14 MED FILL — HUMALOG 100 UNITS/ML KWIKPE: 100 | 30 days supply | Qty: 15 | Fill #1

## 2019-04-17 ENCOUNTER — Encounter: Payer: Self-pay | Admitting: Family Medicine

## 2019-04-17 ENCOUNTER — Telehealth: Payer: Self-pay | Admitting: Family Medicine

## 2019-04-17 ENCOUNTER — Ambulatory Visit: Payer: PRIVATE HEALTH INSURANCE | Admitting: Family Medicine

## 2019-04-17 ENCOUNTER — Other Ambulatory Visit: Payer: Self-pay

## 2019-04-17 VITALS — BP 128/80 | HR 90 | Temp 97.0°F | Ht 63.0 in | Wt 277.4 lb

## 2019-04-17 DIAGNOSIS — R32 Unspecified urinary incontinence: Secondary | ICD-10-CM

## 2019-04-17 DIAGNOSIS — Z23 Encounter for immunization: Secondary | ICD-10-CM | POA: Diagnosis not present

## 2019-04-17 DIAGNOSIS — E139 Other specified diabetes mellitus without complications: Secondary | ICD-10-CM

## 2019-04-17 NOTE — Patient Instructions (Addendum)
Give [place] 2-3 business days to get the results of your labs back.   Continue the basal insulin at 62 units twice daily. Don't eat cookies or sugars because you are worried about going low.   Send me some readings in the next 1-2 weeks over MyChart. I want to know your sugar level before eating and after. I also want to know how many carbs per meal you are eating. This will help [place] decide if we need a sliding scale and if we need to increase your mealtime amount at baseline.   Keep the diet clean and stay active.  Let me know if you change your mind regarding your pneumonia shot.   Try to keep your fluid intake around 60-70 oz daily. Cut down on ginger ale.This can be related to your diabetes as well. Let me know how things are going in your message next week.   Let us know if you need anything.

## 2019-04-17 NOTE — Progress Notes (Signed)
Subjective:   Chief Complaint  Patient presents with  . Follow-up    Alisha Stephenson is a 53 y.o. female here for follow-up of diabetes.   Alisha Stephenson's self monitored glucose range is mid 200's.  Patient denies hypoglycemic reactions. She checks her glucose levels several times per day. Patient does require insulin-Toujeo 62 units twice daily, Humalog 15 units with meals Due to her insurance plan, she cannot afford to go to an endocrinologist.  Over the past several weeks, she has been having issues controlling her urine.  Every time she feels urge to go, she will lose small amounts of urine.  She does drink lots of fluids.  There is no pain, bleeding, discharge, or constipation.  Past Medical History:  Diagnosis Date  . Asthma   . Diabetes mellitus without complication ([place])   . Diverticulitis   . Gallstones   . IBS (irritable bowel syndrome)   . NAFLD (nonalcoholic fatty liver disease)    CT scan 2015  . Ovarian cyst      Related testing: Date of retinal exam: Done Pneumovax: currently declines Flu Shot: done  Review of Systems: Pulmonary:  No SOB Cardiovascular:  No chest pain  Objective:  BP 128/80 (BP Location: Left Arm, Patient Position: Sitting, Cuff Size: Large)   Pulse 90   Temp (!) 97 F (36.1 C) (Temporal)   Ht 5\' 3"  (1.6 m)   Wt 277 lb 6 oz (125.8 kg)   SpO2 98%   BMI 49.13 kg/m  General:  Well developed, well nourished, in no apparent distress Skin:  Warm, no pallor or diaphoresis Head:  Normocephalic, atraumatic Eyes:  Pupils equal and round, sclera anicteric without injection  Lungs:  CTAB, no access msc use Cardio:  RRR, no bruits, no LE edema Musculoskeletal:  Symmetrical muscle groups noted without atrophy or deformity Neuro:  Sensation intact to pinprick on feet Psych: Age appropriate judgment and insight  Assessment:   Diabetes mellitus type 1.5, managed as type 1 ([place]) - Plan: HgB A1c, Urine Microalbumin w/creat. ratio  Urinary incontinence  in female  Need for influenza vaccination - Plan: Flu Vaccine QUAD 6+ mos PF IM (Fluarix Quad PF)   Plan:    1-uncontrolled, continue Toujeo twice daily at 62 units.  She was eating food before bedtime due to concern for hypoglycemia but this is never happened.  If she does get hypoglycemia she will let us know.  I want her to check her sugars before meals and again afterwards.  She she will also report on the carbohydrates she is consuming.  Based off of that, we can figure out a better mealtime routine in addition to developing a sliding scale for her.  She is to send [place] a message with this information. Counseled on diet and exercise.  Her case is more challenging because she is having increased out-of-pocket expenses for her insulin. 2-this could be a side effect of her uncontrolled diabetes.  Mind fluid intake.  Use restroom regularly and do not try to hold it too long.  If no improvement, will consider Myrbetriq versus Ditropan. F/u in 3 mo for CPE. The patient voiced understanding and agreement to the plan.  [place], DO 04/17/19 4:29 PM

## 2019-04-17 NOTE — Telephone Encounter (Signed)
PCP gave the patient samples at [facility] on 04/17/2019 humalog U-100 Kwikpen #2 boxes one 3 ml prefilled insulin pen per box Lot#[id number] Expiration date 11/2020 Documented in white notebook

## 2019-04-18 LAB — MICROALBUMIN / CREATININE URINE RATIO
Creatinine,U: 54.5 mg/dL
Microalb Creat Ratio: 1.3 mg/g (ref 0.0–30.0)
Microalb, Ur: <0.7 mg/dL (ref 0.0–1.9)

## 2019-04-18 LAB — HEMOGLOBIN A1C: Hgb A1c MFr Bld: 9.6 % — ABNORMAL HIGH (ref 4.6–6.5)

## 2019-04-21 IMAGING — US US EXTREM LOW VENOUS*L*
1 series · 13 of 24 positions shown · non-contrast
Comparison: None.

CLINICAL DATA: Left lower leg pain and edema, Achilles tendinitis



[Series 1: us extrem low venous*left* · 0.10mm/px · 13 of 35 slices shown]
[im 1/35]
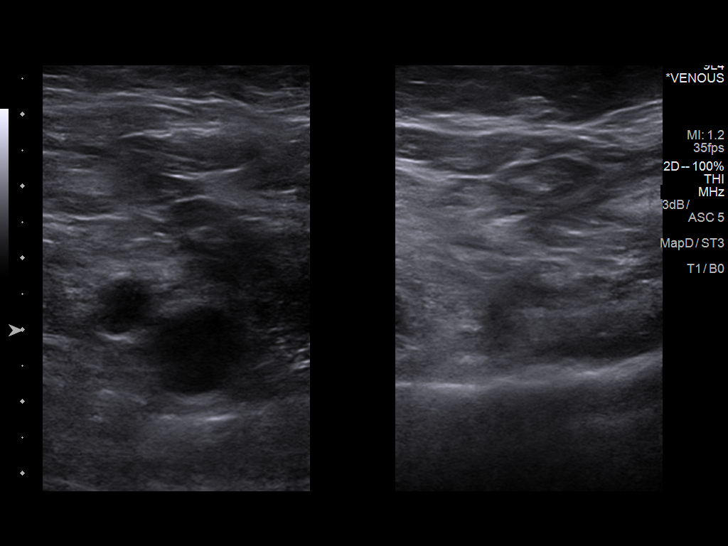
[im 3/35]
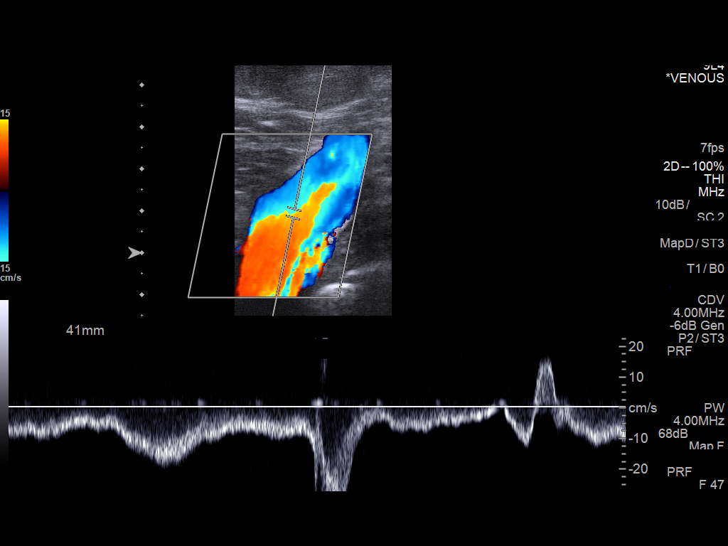
[im 6/35]
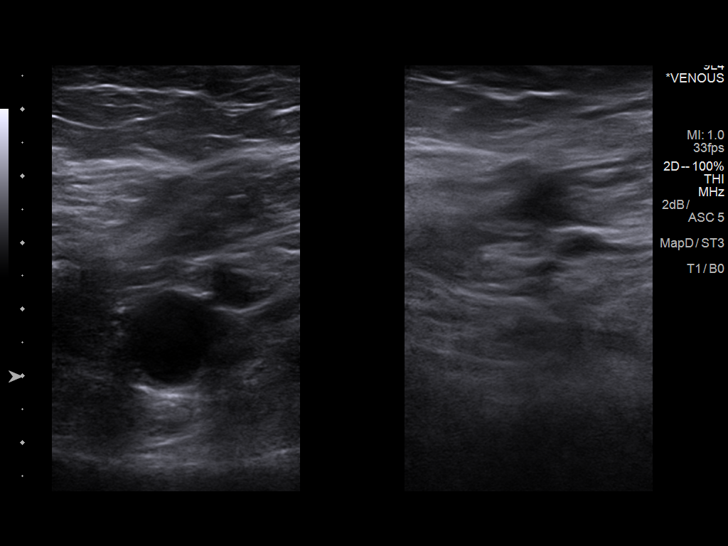
[im 9/35]
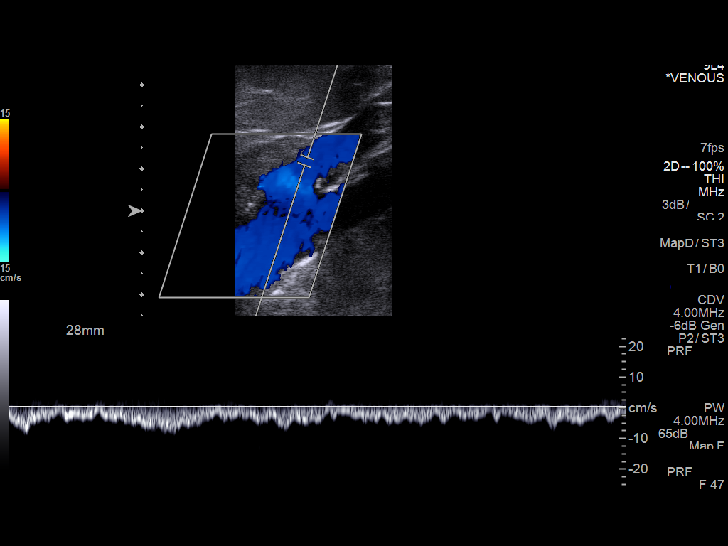
[im 12/35]
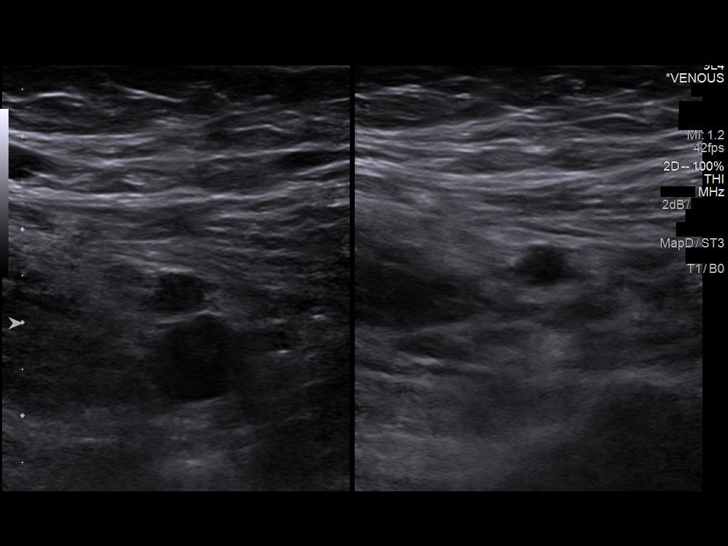
[im 15/35]
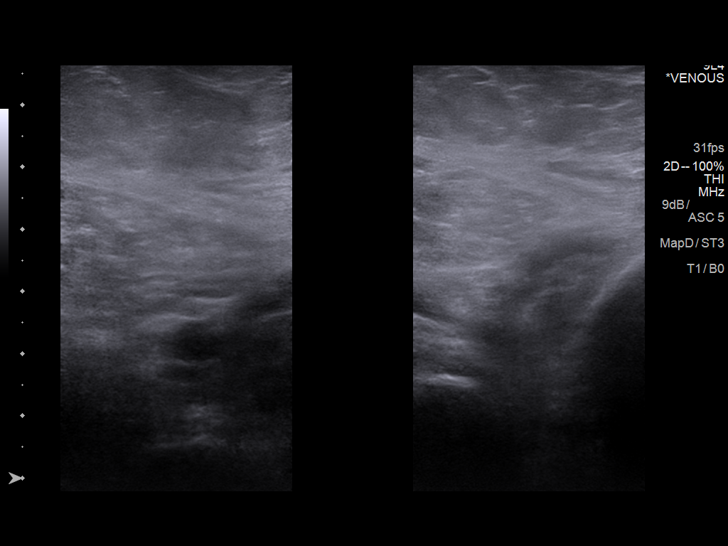
[im 18/35]
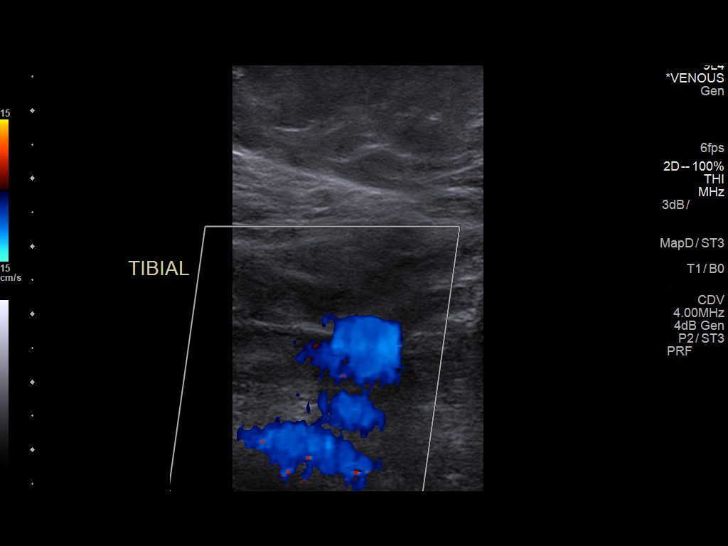
[im 20/35]
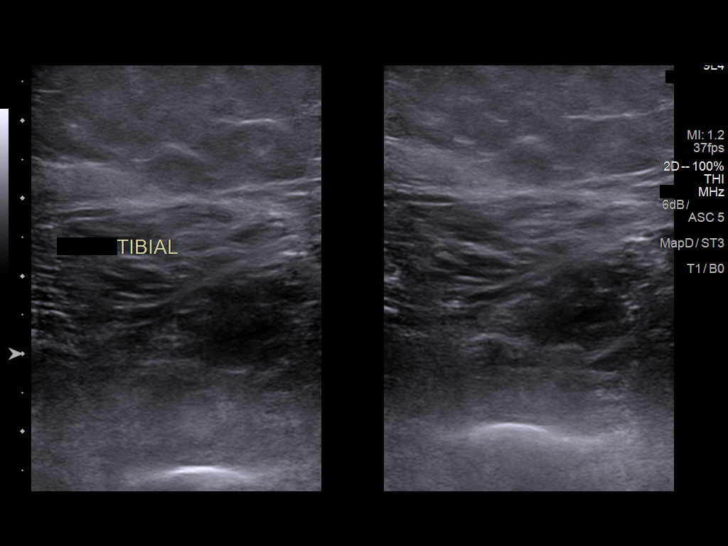
[im 23/35]
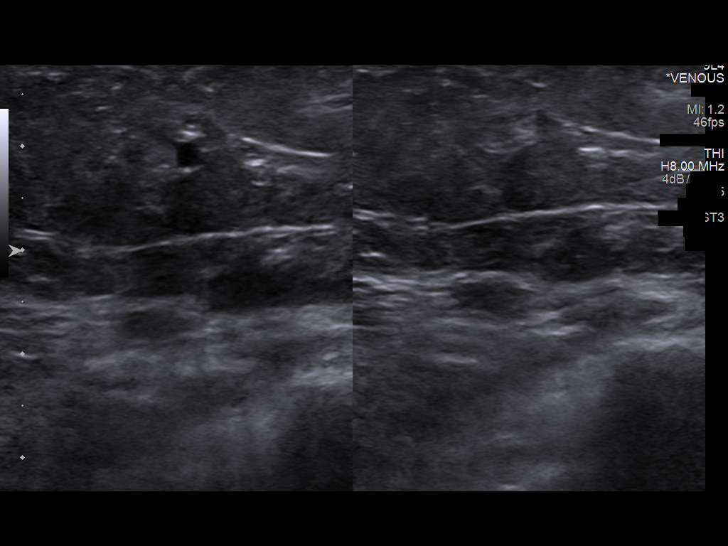
[im 26/35]
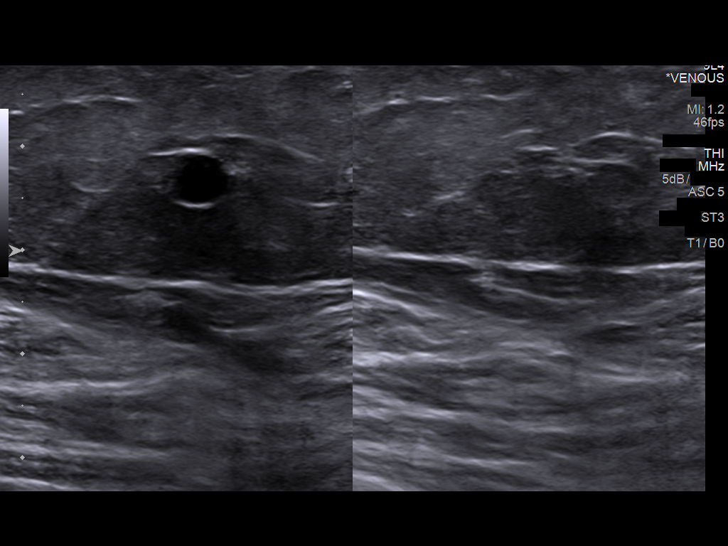
[im 29/35]
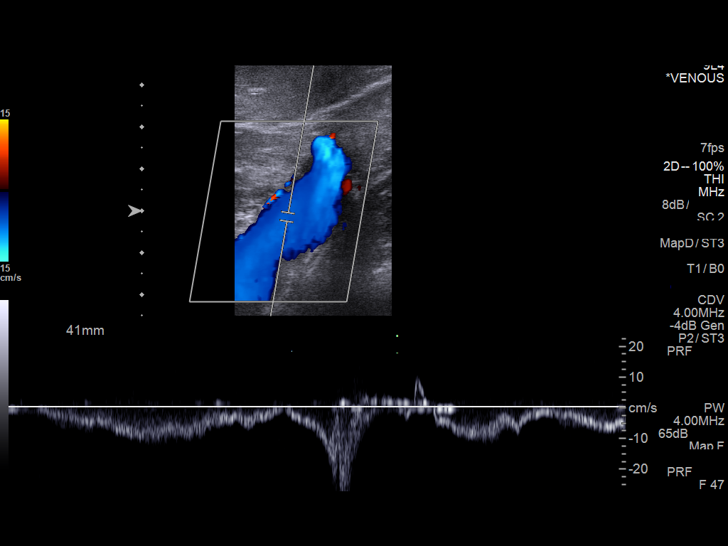
[im 32/35]
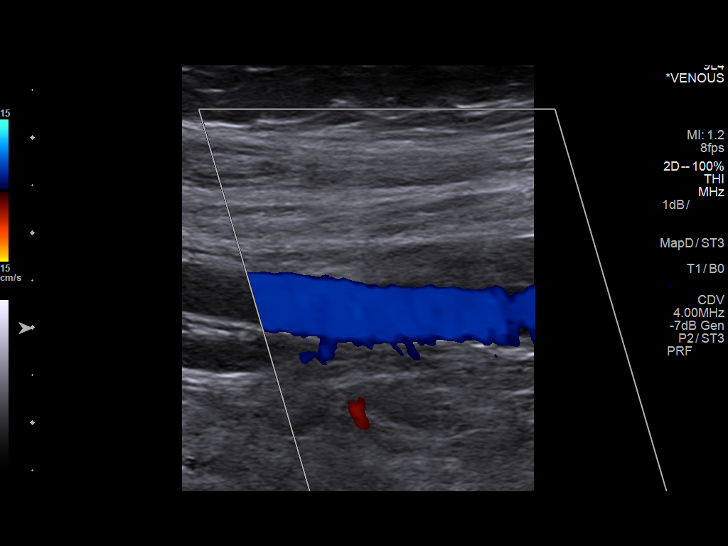
[im 35/35]
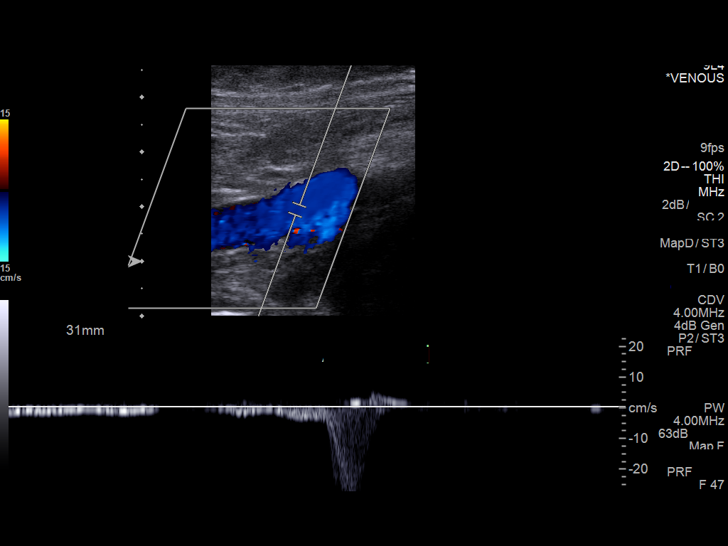

[13 of 24 positions shown; findings below may reference images not displayed]

FINDINGS: Contralateral Common Femoral Vein: Respiratory phasicity is normal
and symmetric with the symptomatic side. No evidence of thrombus.
Normal compressibility.

Common Femoral Vein: No evidence of thrombus. Normal
compressibility, respiratory phasicity and response to augmentation.

Saphenofemoral Junction: No evidence of thrombus. Normal
compressibility and flow on color Doppler imaging.

Profunda Femoral Vein: No evidence of thrombus. Normal
compressibility and flow on color Doppler imaging.

Femoral Vein: No evidence of thrombus. Normal compressibility,
respiratory phasicity and response to augmentation.

Popliteal Vein: No evidence of thrombus. Normal compressibility,
respiratory phasicity and response to augmentation.

Calf Veins: No evidence of thrombus. Normal compressibility and flow
on color Doppler imaging.

Superficial Great Saphenous Vein: No evidence of thrombus. Normal
compressibility.

Venous Reflux:  None.

Other Findings:  None.
IMPRESSION: No evidence of deep venous thrombosis.

## 2019-04-28 ENCOUNTER — Encounter: Payer: Self-pay | Admitting: Family Medicine

## 2019-05-01 ENCOUNTER — Other Ambulatory Visit: Payer: Self-pay | Admitting: Family Medicine

## 2019-05-01 DIAGNOSIS — E139 Other specified diabetes mellitus without complications: Secondary | ICD-10-CM

## 2019-05-01 NOTE — Progress Notes (Signed)
Increase basal insulin to 66 u bid from 62 u bid. Add sliding scale to bolus insulin. 15 u w meals, now will add 1 unit for every 10 g of carbs. Message sent to pt, med list updated.

## 2019-05-02 MED FILL — TOUJEO MAX SOLOSTAR 300 UNI: 300 | 29 days supply | Qty: 12 | Fill #2

## 2019-05-17 ENCOUNTER — Encounter: Payer: Self-pay | Admitting: Family Medicine

## 2019-05-17 ENCOUNTER — Ambulatory Visit (INDEPENDENT_AMBULATORY_CARE_PROVIDER_SITE_OTHER): Payer: PRIVATE HEALTH INSURANCE | Admitting: Family Medicine

## 2019-05-17 DIAGNOSIS — J0100 Acute maxillary sinusitis, unspecified: Secondary | ICD-10-CM | POA: Diagnosis not present

## 2019-05-17 MED ORDER — AZITHROMYCIN 250 MG PO TABS: ORAL_TABLET | ORAL | 0 refills | Status: DC

## 2019-05-17 NOTE — Progress Notes (Signed)
Chief Complaint  Patient presents with  . Facial Pain    congestion    Alisha Stephenson here for URI complaints. Due to COVID-19 pandemic, we are interacting via web portal for an electronic face-to-face visit. I verified patient's ID using 2 identifiers. Patient agreed to proceed with visit via this method. Patient is at home, I am at office. Patient and I are present for visit.   Duration: 2 weeks  Associated symptoms: sinus congestion, sinus pain, rhinorrhea and cough Denies: itchy watery eyes, ear pain, ear drainage, sore throat, wheezing, shortness of breath, myalgia and fevers Treatment to date: BC sinus, Mucinex Sick contacts: No  ROS:  Const: Denies fevers HEENT: As noted in HPI Lungs: No SOB  Past Medical History:  Diagnosis Date  . Asthma   . Diabetes mellitus without complication ([place])   . Diverticulitis   . Gallstones   . IBS (irritable bowel syndrome)   . NAFLD (nonalcoholic fatty liver disease)    CT scan 2015  . Ovarian cyst    Exam No conversational dyspnea Age appropriate judgment and insight Nml affect and mood  Acute non-recurrent maxillary sinusitis - Plan: azithromycin (ZITHROMAX) 250 MG tablet  Will tx given duration. Does well w Zpak.  Ibuprofen, Tylenol.  Continue to push fluids, practice good hand hygiene, cover mouth when coughing. F/u prn. If starting to experience fevers, shaking, or shortness of breath, seek immediate care. Pt voiced understanding and agreement to the plan.  [place], DO 05/17/19 12:58 PM

## 2019-05-19 MED FILL — HUMALOG 100 UNITS/ML KWIKPE: 100 | 30 days supply | Qty: 15 | Fill #2

## 2019-05-31 MED FILL — TOUJEO MAX SOLOSTAR 300 UNI: 300 | 29 days supply | Qty: 12 | Fill #3

## 2019-06-13 ENCOUNTER — Encounter: Payer: Self-pay | Admitting: Family Medicine

## 2019-06-13 ENCOUNTER — Ambulatory Visit (INDEPENDENT_AMBULATORY_CARE_PROVIDER_SITE_OTHER): Payer: PRIVATE HEALTH INSURANCE | Admitting: Family Medicine

## 2019-06-13 ENCOUNTER — Telehealth: Payer: Self-pay

## 2019-06-13 ENCOUNTER — Other Ambulatory Visit: Payer: Self-pay

## 2019-06-13 DIAGNOSIS — M79642 Pain in left hand: Secondary | ICD-10-CM | POA: Diagnosis not present

## 2019-06-13 DIAGNOSIS — M79641 Pain in right hand: Secondary | ICD-10-CM | POA: Diagnosis not present

## 2019-06-13 DIAGNOSIS — E139 Other specified diabetes mellitus without complications: Secondary | ICD-10-CM

## 2019-06-13 NOTE — Progress Notes (Signed)
Chief Complaint  Patient presents with  . Weight Loss    Subjective: Patient is a 54 y.o. female here for f/u sugars. Due to COVID-19 pandemic, we are interacting via web portal for an electronic face-to-face visit. I verified patient's ID using 2 identifiers. Patient agreed to proceed with visit via this method. Patient is at home, I am at office. Patient and I are present for visit.   Patient has a history of type 1.5 diabetes.  She is currently taking 62 units of Toujeo twice daily and 15 units of Humalog with meals plus a 10-1 carb insulin sliding scale.  Sugars have been decreasing overall.  She had gained 20 pounds over the past month and a half inserted weight watchers 5 days ago.  Since that time, she has lost 6 pounds and her sugars have notably decreased.  Over the last 2 nights she is not taking her Toujeo.  Her evening sugars were in the mid 100s and were also the same in the morning.  Her postmeal sugars are in the mid 200s.  She is taking her morning dose of the Toujeo.  Diet is improved.  Exercise is unchanged.  She starts to feel little shaky in the low 100s with her sugar readings.  This is improved.  The patient has a history of right middle finger knuckle pain.  She feels snapping over the knuckle.  She states it is on the back of her hand rather than the palmar side.  No catching or locking otherwise.  She has not tried anything so far.   ROS: Heart: Denies chest pain  Lungs: Denies SOB   Past Medical History:  Diagnosis Date  . Asthma   . Diabetes mellitus without complication ([place])   . Diverticulitis   . Gallstones   . IBS (irritable bowel syndrome)   . NAFLD (nonalcoholic fatty liver disease)    CT scan 2015  . Ovarian cyst     Objective: No conversational dyspnea Age appropriate judgment and insight Nml affect and mood  Assessment and Plan: Diabetes mellitus type 1.5, managed as type 1 ([place]) - Plan: insulin lispro (HUMALOG) 100 UNIT/ML KwikPen  Pain in  both hands  1- I think she made a good decision w her dietary habits. Decrease nighttime dose ot 25 u and let me know what her AM readings are moving forward. Leave 62 u in AM alone. Keep 15 u Humalog w meals. Change sliding scale to 8:1. MyChart message sent w this info.  Counseled on diet and exercise. 2-difficult to see where she is pointing, seems like arthritis or tendinitis.  Tylenol, ice/heat, ibuprofen or topical anti-inflammatory.  If no improvement, will consider injection versus referral to occupational therapy. Follow-up pending above results. The patient voiced understanding and agreement to the plan.  [place], DO 06/13/19  3:24 PM

## 2019-06-13 NOTE — Telephone Encounter (Signed)
Copied from [place] [id number]. Topic: General - Other >> Jun 13, 2019 12:54 PM Leward Quan A wrote: Reason for CRM: Patient called per message in My Chart from [place] to schedule an appointment with Dr Nani Ravens. She is hoping to get something for today or tomorrow. Please call her at Ph# [phone]

## 2019-06-26 MED FILL — HUMALOG 100 UNITS/ML KWIKPE: 100 | 30 days supply | Qty: 15 | Fill #3

## 2019-07-10 MED FILL — TOUJEO MAX SOLOSTAR 300 UNI: 300 | 30 days supply | Qty: 12 | Fill #1

## 2019-07-12 ENCOUNTER — Encounter: Payer: Self-pay | Admitting: Family Medicine

## 2019-07-12 ENCOUNTER — Other Ambulatory Visit: Payer: Self-pay

## 2019-07-12 ENCOUNTER — Ambulatory Visit (INDEPENDENT_AMBULATORY_CARE_PROVIDER_SITE_OTHER): Payer: PRIVATE HEALTH INSURANCE | Admitting: Family Medicine

## 2019-07-12 VITALS — Temp 98.3°F | Ht 64.0 in | Wt 275.0 lb

## 2019-07-12 DIAGNOSIS — J0100 Acute maxillary sinusitis, unspecified: Secondary | ICD-10-CM

## 2019-07-12 MED ORDER — DOXYCYCLINE HYCLATE 100 MG PO TABS: 100.0000 mg | ORAL_TABLET | Freq: Two times a day (BID) | ORAL | 0 refills | Status: AC

## 2019-07-12 MED FILL — DOXYCYCLINE HYCLATE 100 MG: 100 | 7 days supply | Qty: 14 | Fill #0

## 2019-07-12 NOTE — Progress Notes (Signed)
Chief Complaint  Patient presents with  . Sore Throat    has lost 12.1 lbs.   . Nasal Congestion    Ilamae L Thomure here for URI complaints. Due to COVID-19 pandemic, we are interacting via web portal for an electronic face-to-face visit. I verified patient's ID using 2 identifiers. Patient agreed to proceed with visit via this method. Patient is at home, I am at office. Patient and I are present for visit.    Duration: 2 weeks   Associated symptoms: sinus congestion, sinus pain, rhinorrhea, ear pain, sore throat and post nasal drainage, dental pain Denies: itchy watery eyes, ear drainage, wheezing, shortness of breath, myalgia and fevers Treatment to date: INCS Sick contacts: No  ROS:  Const: Denies fevers HEENT: As noted in HPI Lungs: No SOB  Past Medical History:  Diagnosis Date  . Asthma   . Diabetes mellitus without complication ([place])   . Diverticulitis   . Gallstones   . IBS (irritable bowel syndrome)   . NAFLD (nonalcoholic fatty liver disease)    CT scan 2015  . Ovarian cyst     Temp 98.3 F (36.8 C) (Oral)   Ht 5\' 4"  (1.626 m)   Wt 275 lb (124.7 kg)   BMI 47.20 kg/m  No conversational dyspnea Age appropriate judgment and insight Nml affect and mood  Acute maxillary sinusitis, recurrence not specified - Plan: doxycycline (VIBRA-TABS) 100 MG tablet  Orders as above. Continue to push fluids, practice good hand hygiene, cover mouth when coughing. F/u prn. If starting to experience fevers, shaking, or shortness of breath, seek immediate care. Pt voiced understanding and agreement to the plan.  [place], DO 07/12/19 9:58 AM

## 2019-07-13 ENCOUNTER — Other Ambulatory Visit: Payer: Self-pay | Admitting: Family Medicine

## 2019-07-13 MED ORDER — BENZONATATE 100 MG PO CAPS: 100.0000 mg | ORAL_CAPSULE | Freq: Three times a day (TID) | ORAL | 0 refills | Status: DC | PRN

## 2019-07-13 MED FILL — BENZONATATE 100 MG CAPS: 100 | 10 days supply | Qty: 30 | Fill #0

## 2019-07-19 ENCOUNTER — Encounter: Payer: PRIVATE HEALTH INSURANCE | Admitting: Family Medicine

## 2019-07-24 MED FILL — HUMALOG 100 UNITS/ML KWIKPE: 100 | 30 days supply | Qty: 15 | Fill #4

## 2019-08-10 ENCOUNTER — Encounter: Payer: Self-pay | Admitting: Family Medicine

## 2019-08-11 ENCOUNTER — Encounter: Payer: PRIVATE HEALTH INSURANCE | Admitting: Family Medicine

## 2019-08-21 MED FILL — HUMALOG 100 UNITS/ML KWIKPE: 100 | 30 days supply | Qty: 15 | Fill #5

## 2019-08-21 MED FILL — TOUJEO MAX SOLOSTAR 300 UNI: 300 | 30 days supply | Qty: 12 | Fill #2

## 2019-09-01 ENCOUNTER — Other Ambulatory Visit: Payer: Self-pay

## 2019-09-04 ENCOUNTER — Ambulatory Visit (INDEPENDENT_AMBULATORY_CARE_PROVIDER_SITE_OTHER): Payer: No Typology Code available for payment source | Admitting: Family Medicine

## 2019-09-04 ENCOUNTER — Other Ambulatory Visit: Payer: Self-pay

## 2019-09-04 ENCOUNTER — Encounter: Payer: Self-pay | Admitting: Family Medicine

## 2019-09-04 VITALS — BP 132/70 | HR 52 | Temp 96.8°F | Ht 63.0 in | Wt 278.0 lb

## 2019-09-04 DIAGNOSIS — E139 Other specified diabetes mellitus without complications: Secondary | ICD-10-CM

## 2019-09-04 DIAGNOSIS — Z0000 Encounter for general adult medical examination without abnormal findings: Secondary | ICD-10-CM

## 2019-09-04 NOTE — Patient Instructions (Addendum)
Give [place] 2-3 business days to get the results of your labs back.   Keep the diet clean and stay active.  Call your insurance company about a screening colonoscopy - let Dr. Lyndel Safe know if they cover it.   For the swelling in your lower extremities, be sure to elevate your legs when able, mind the salt intake, stay physically active and consider wearing compression stockings.  If you decide to get the pneumonia vaccine, let me know.   Let us know if you need anything.

## 2019-09-04 NOTE — Progress Notes (Signed)
Chief Complaint  Patient presents with  . Annual Exam     Well Woman Alisha Stephenson is here for a complete physical.   Her last physical was >1 year ago.  Current diet: in general, a "healthy" diet. Current exercise: cycling. Weight is stable and she denies daytime fatigue. Seatbelt? Yes  Health Maintenance Mammogram- Yes Colon cancer screening-No-she is due but has terrible insurance and is unsure if they cover a screening colonoscopy Shingrix- No Tetanus- Yes Hep C screening- Yes HIV screening- Yes  Past Medical History:  Diagnosis Date  . Asthma   . Diabetes mellitus without complication ([place])   . Diverticulitis   . Gallstones   . IBS (irritable bowel syndrome)   . NAFLD (nonalcoholic fatty liver disease)    CT scan 2015  . Ovarian cyst      Past Surgical History:  Procedure Laterality Date  . ABDOMINAL HYSTERECTOMY    . CESAREAN SECTION    . CHOLECYSTECTOMY    . COLONOSCOPY     about 2011    Medications  Current Outpatient Medications on File Prior to Visit  Medication Sig Dispense Refill  . albuterol (PROVENTIL HFA;VENTOLIN HFA) 108 (90 Base) MCG/ACT inhaler Inhale 2 puffs into the lungs every 6 (six) hours as needed for wheezing or shortness of breath. 1 Inhaler 2  . benzonatate (TESSALON) 100 MG capsule Take 1 capsule (100 mg total) by mouth 3 (three) times daily as needed. 30 capsule 0  . Blood Glucose Monitoring Suppl (ONETOUCH VERIO) w/Device KIT Test as directed once weekly to check blood sugar. 1 kit 0  . dicyclomine (BENTYL) 10 MG capsule Take 1 capsule (10 mg total) by mouth 4 (four) times daily -  before meals and at bedtime. 120 capsule 6  . fluticasone (FLONASE) 50 MCG/ACT nasal spray PLACE 2 SPRAYS INTO BOTH NOSTRILS DAILY. 16 g 1  . glucose blood (ONETOUCH VERIO) test strip USE STRIP TO CHECK GLUCOSE THREE TIMES DAILY 300 each 3  . Insulin Glargine, 2 Unit Dial, (TOUJEO MAX SOLOSTAR) 300 UNIT/ML SOPN Inject 62 Units into the skin 2 (two) times  daily. Take 62 units in the morning and 25 units in the evening. 3 mL 3  . insulin lispro (HUMALOG) 100 UNIT/ML KwikPen 15 units with each meal + 8 g/1 unit sliding scale. 15 mL 11  . ondansetron (ZOFRAN) 4 MG tablet Take 1 tablet (4 mg total) by mouth every 8 (eight) hours as needed for nausea or vomiting. 30 tablet 1  . ONE TOUCH LANCETS MISC Use once a week to check blood sugar. 1001 each 1    Allergies Allergies  Allergen Reactions  . Sulfa Antibiotics Rash and Other (See Comments)    Review of Systems: Constitutional:  no unexpected weight changes Eye:  no recent significant change in vision Ear/Nose/Mouth/Throat:  Ears:  no recent change in hearing Nose/Mouth/Throat:  no complaints of nasal congestion, no sore throat Cardiovascular: no chest pain Respiratory:  no shortness of breath Gastrointestinal:  no abdominal pain, no change in bowel habits GU:  Female: negative for dysuria or pelvic pain Musculoskeletal/Extremities: + Bilateral hand pain; no pain of the joints Integumentary (Skin/Breast):  no abnormal skin lesions reported Neurologic:  no headaches Endocrine:  denies fatigue Hematologic/Lymphatic:  No areas of easy bleeding  Exam BP 132/70 (BP Location: Left Arm, Patient Position: Sitting, Cuff Size: Large)   Pulse (!) 52   Temp (!) 96.8 F (36 C) (Temporal)   Ht _0  (1.6 m)  Wt 278 lb (126.1 kg)   SpO2 97%   BMI 49.25 kg/m  General:  well developed, well nourished, in no apparent distress Skin:  no significant moles, warts, or growths Head:  no masses, lesions, or tenderness Eyes:  pupils equal and round, sclera anicteric without injection Ears:  canals without lesions, TMs shiny without retraction, no obvious effusion, no erythema Nose:  nares patent, septum midline, mucosa normal, and no drainage or sinus tenderness Throat/Pharynx:  lips and gingiva without lesion; tongue and uvula midline; non-inflamed pharynx; no exudates or postnasal drainage Neck:  neck supple without adenopathy, thyromegaly, or masses Lungs:  clear to auscultation, breath sounds equal bilaterally, no respiratory distress Cardio:  regular rate and rhythm, no LE edema Abdomen:  abdomen soft, nontender; bowel sounds normal; no masses or organomegaly Genital: Defer to GYN Musculoskeletal: There is some triggering of her index fingers bilaterally; no nodules noted over this area; symmetrical muscle groups noted without atrophy or deformity Extremities:  no clubbing, cyanosis, or edema, no deformities, no skin discoloration Neuro:  gait normal; deep tendon reflexes normal and symmetric Psych: well oriented with normal range of affect and appropriate judgment/insight  Assessment and Plan  Well adult exam - Plan: CBC, Comprehensive metabolic panel, Lipid panel  Diabetes mellitus type 1.5, managed as type 1 ([facility]) - Plan: Hemoglobin A1c   Well 54 y.o. female. Counseled on diet and exercise. Other orders as above. Follow up 3-6 mo pending lab results. The patient voiced understanding and agreement to the plan.  [place], DO 09/04/19 3:13 PM

## 2019-09-05 LAB — LIPID PANEL
Cholesterol: 177 mg/dL (ref 0–200)
HDL: 32.8 mg/dL — ABNORMAL LOW (ref >39.00)
NonHDL: 144.29
Total CHOL/HDL Ratio: 5
Triglycerides: 277 mg/dL — ABNORMAL HIGH (ref 0.0–149.0)
VLDL: 55.4 mg/dL — ABNORMAL HIGH (ref 0.0–40.0)

## 2019-09-05 LAB — COMPREHENSIVE METABOLIC PANEL
ALT: 27 U/L (ref 0–35)
AST: 21 U/L (ref 0–37)
Albumin: 3.9 g/dL (ref 3.5–5.2)
Alkaline Phosphatase: 105 U/L (ref 39–117)
BUN: 14 mg/dL (ref 6–23)
CO2: 30 mEq/L (ref 19–32)
Calcium: 9.1 mg/dL (ref 8.4–10.5)
Chloride: 102 mEq/L (ref 96–112)
Creatinine, Ser: 0.74 mg/dL (ref 0.40–1.20)
GFR: 81.56 mL/min (ref >60.00)
Glucose, Bld: 229 mg/dL — ABNORMAL HIGH (ref 70–99)
Potassium: 4.5 mEq/L (ref 3.5–5.1)
Sodium: 137 mEq/L (ref 135–145)
Total Bilirubin: 0.5 mg/dL (ref 0.2–1.2)
Total Protein: 6.4 g/dL (ref 6.0–8.3)

## 2019-09-05 LAB — LDL CHOLESTEROL, DIRECT: Direct LDL: 105 mg/dL

## 2019-09-05 LAB — CBC
HCT: 43.6 % (ref 36.0–46.0)
Hemoglobin: 14.7 g/dL (ref 12.0–15.0)
MCHC: 33.8 g/dL (ref 30.0–36.0)
MCV: 88.9 fl (ref 78.0–100.0)
Platelets: 174 10*3/uL (ref 150.0–400.0)
RBC: 4.91 Mil/uL (ref 3.87–5.11)
RDW: 14.1 % (ref 11.5–15.5)
WBC: 9 10*3/uL (ref 4.0–10.5)

## 2019-09-05 LAB — TSH: TSH: 2.71 u[IU]/mL (ref 0.35–4.50)

## 2019-09-05 LAB — HEMOGLOBIN A1C: Hgb A1c MFr Bld: 9.9 % — ABNORMAL HIGH (ref 4.6–6.5)

## 2019-09-06 ENCOUNTER — Other Ambulatory Visit: Payer: Self-pay | Admitting: Family Medicine

## 2019-09-06 MED ORDER — TOUJEO MAX SOLOSTAR 300 UNIT/ML ~~LOC~~ SOPN: 62.0000 [IU] | PEN_INJECTOR | Freq: Two times a day (BID) | SUBCUTANEOUS | 3 refills | Status: DC

## 2019-09-07 DIAGNOSIS — K581 Irritable bowel syndrome with constipation: Secondary | ICD-10-CM

## 2019-09-07 DIAGNOSIS — R1012 Left upper quadrant pain: Secondary | ICD-10-CM

## 2019-09-11 ENCOUNTER — Other Ambulatory Visit: Payer: Self-pay | Admitting: Family Medicine

## 2019-09-11 DIAGNOSIS — E119 Type 2 diabetes mellitus without complications: Secondary | ICD-10-CM

## 2019-09-11 DIAGNOSIS — E139 Other specified diabetes mellitus without complications: Secondary | ICD-10-CM

## 2019-09-13 MED ORDER — DICYCLOMINE HCL 10 MG PO CAPS: 10.0000 mg | ORAL_CAPSULE | Freq: Three times a day (TID) | ORAL | 6 refills | Status: AC

## 2019-09-13 MED FILL — DICYCLOMINE 10 MG CAPSULE: 10 | 30 days supply | Qty: 120 | Fill #0

## 2019-09-18 MED FILL — TOUJEO MAX SOLOSTAR 300 UNI: 300 | 30 days supply | Qty: 12 | Fill #3

## 2019-09-18 MED FILL — HUMALOG 100 UNITS/ML KWIKPE: 100 | 30 days supply | Qty: 15 | Fill #6

## 2019-09-28 MED FILL — TOUJEO MAX SOLOSTAR 300 UNI: 300 | 30 days supply | Qty: 12 | Fill #3

## 2019-10-11 ENCOUNTER — Ambulatory Visit: Payer: No Typology Code available for payment source | Admitting: Family Medicine

## 2019-11-05 IMAGING — MG DIGITAL SCREENING BILATERAL MAMMOGRAM WITH TOMO AND CAD
6 of 12 series · 6 of 36 positions shown · non-contrast
Comparison: Previous exam(s).

ACR Breast Density Category a: The breast tissue is almost entirely
fatty.

CLINICAL DATA: Screening.

EXAM:
DIGITAL SCREENING BILATERAL MAMMOGRAM WITH TOMO AND CAD

[R XCCL synth-2D]
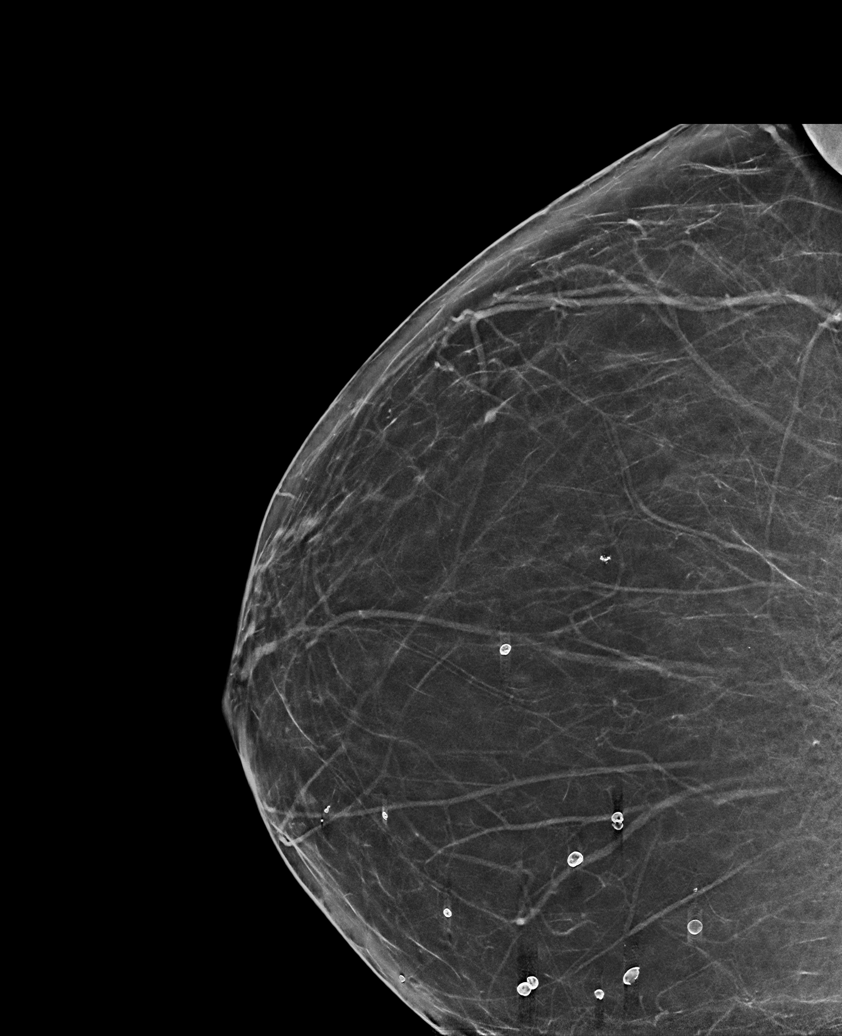

[R CC synth-2D]
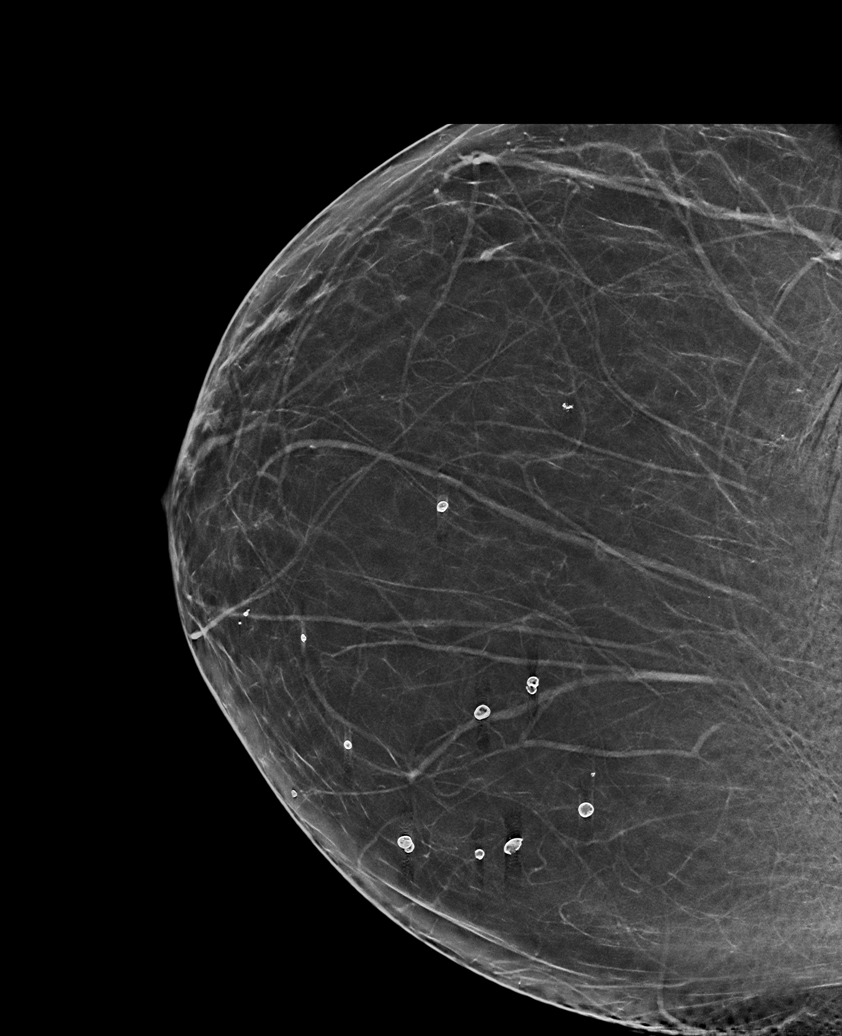

[R CV synth-2D]
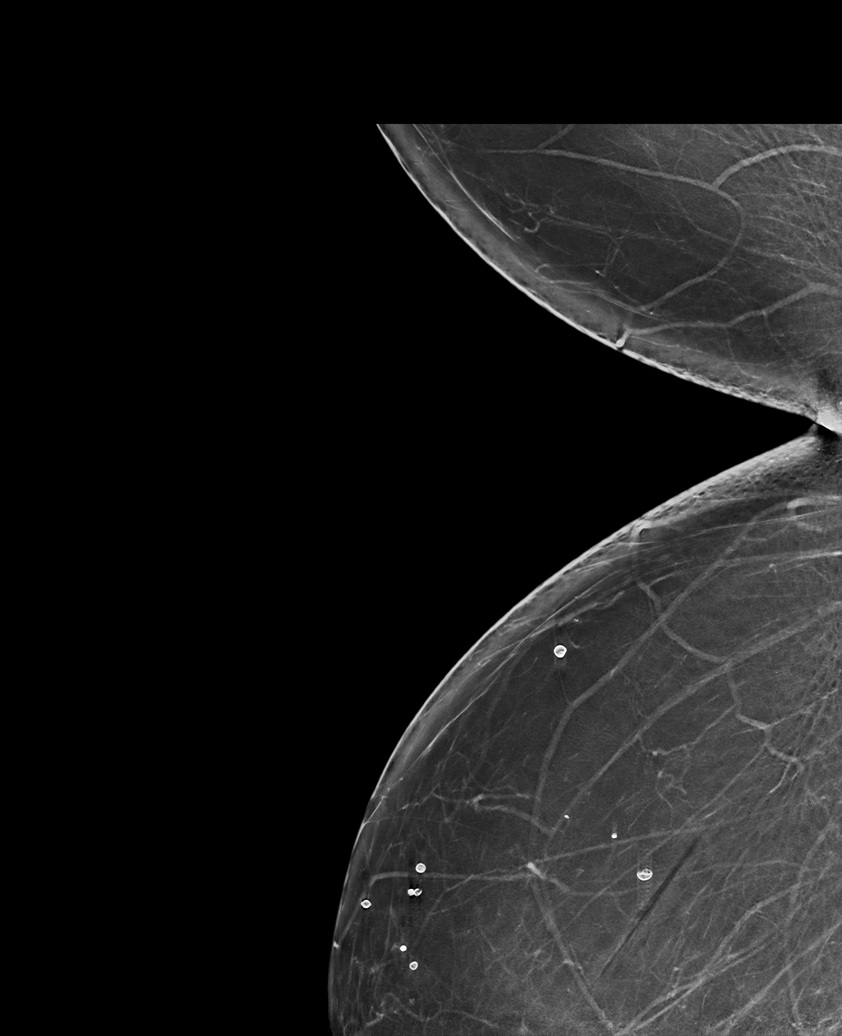

[L CC synth-2D]
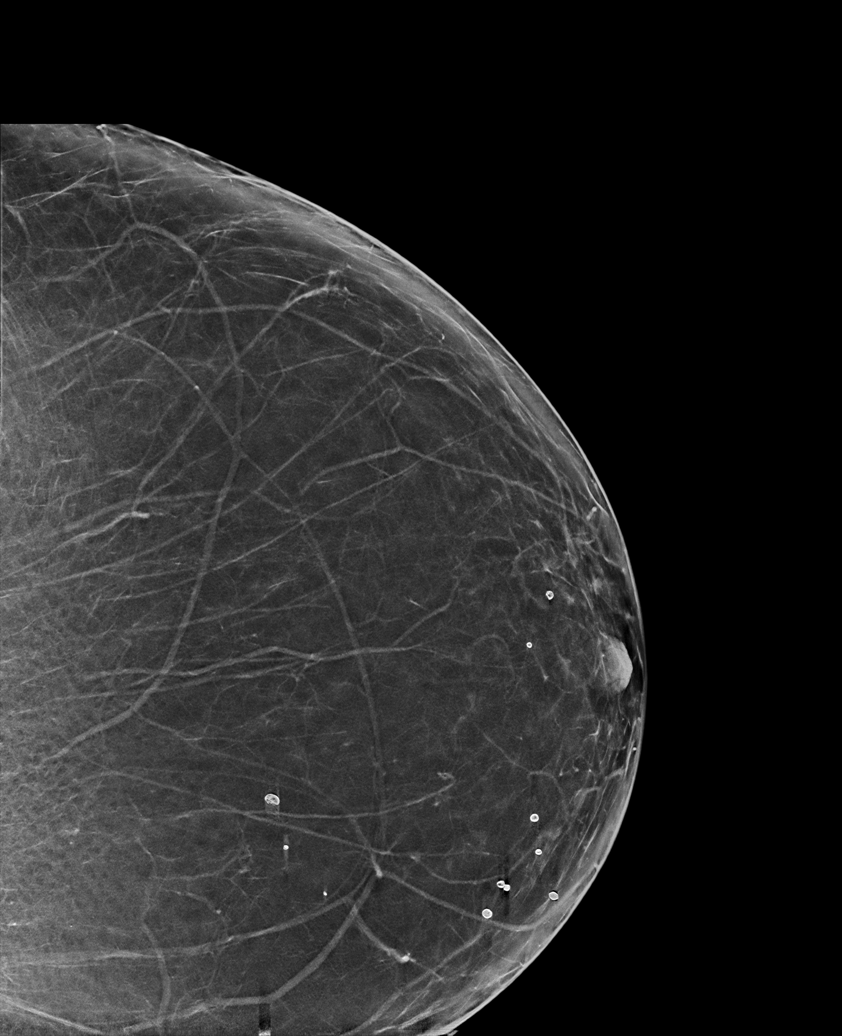

[L MLO synth-2D]
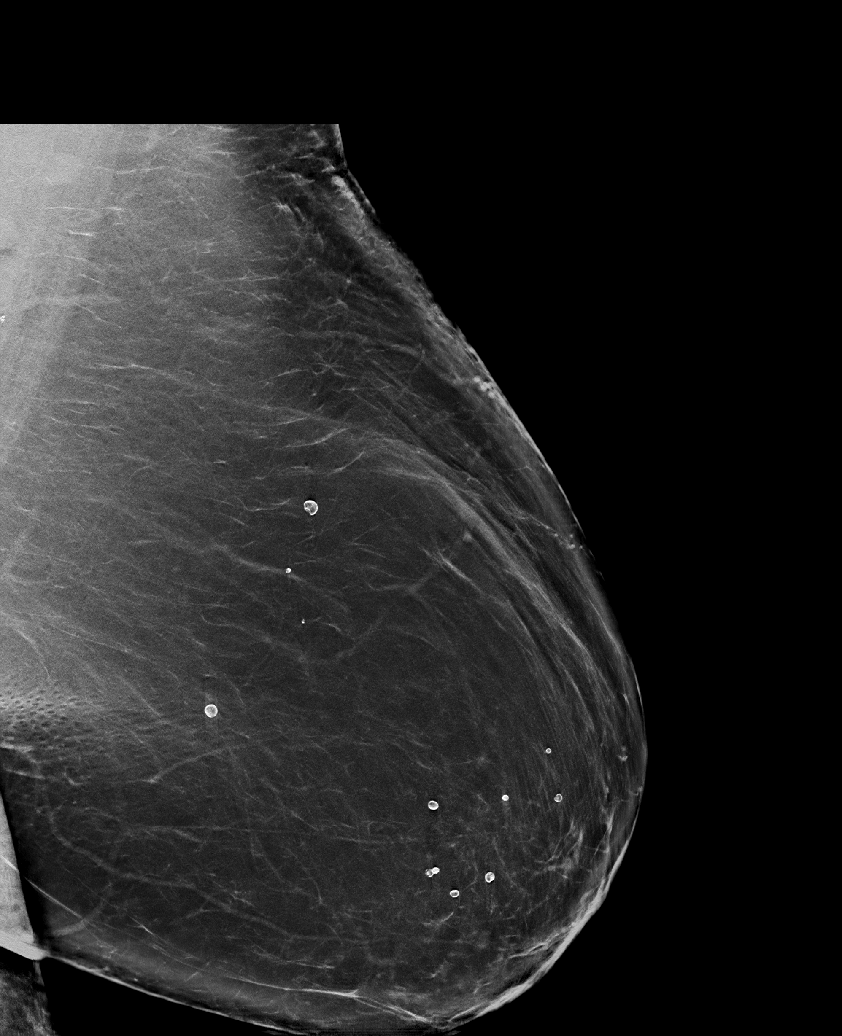

[R MLO synth-2D]
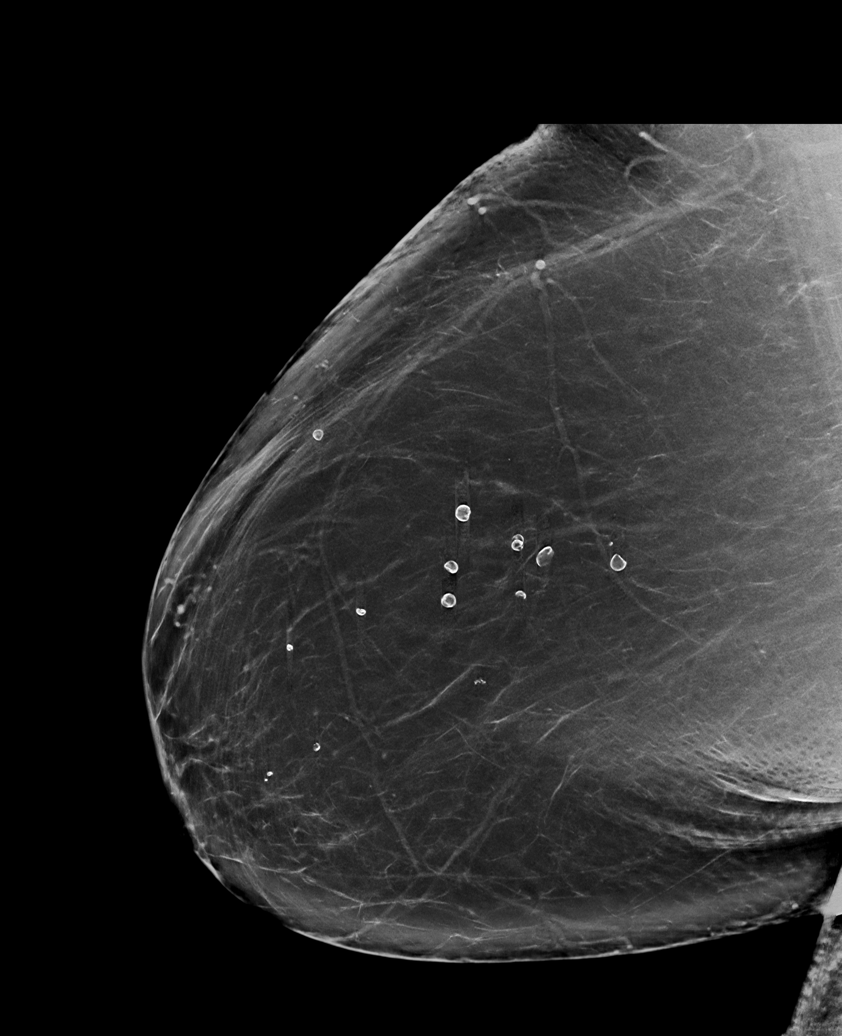

[6 of 36 positions shown; findings below may reference images not displayed]

FINDINGS: There are no findings suspicious for malignancy. Images were
processed with CAD.
IMPRESSION: No mammographic evidence of malignancy. A result letter of this
screening mammogram will be mailed directly to the patient.

RECOMMENDATION:
Screening mammogram in one year. (Code:[id number]-[id number])

BI-RADS CATEGORY  1: Negative.

## 2019-11-24 ENCOUNTER — Other Ambulatory Visit: Payer: Self-pay | Admitting: Family Medicine

## 2019-11-24 MED FILL — HUMALOG 100 UNITS/ML KWIKPE: 100 | 30 days supply | Qty: 15 | Fill #8

## 2019-11-24 MED FILL — TOUJEO MAX SOLOSTAR 300 UNI: 300 | 30 days supply | Qty: 12 | Fill #0

## 2019-12-11 ENCOUNTER — Other Ambulatory Visit: Payer: Self-pay

## 2019-12-11 ENCOUNTER — Ambulatory Visit
Admission: EM | Admit: 2019-12-11 | Discharge: 2019-12-11 | Disposition: A | Discharge status: Home / Self Care | Payer: No Typology Code available for payment source | Attending: Physician Assistant | Admitting: Physician Assistant

## 2019-12-11 ENCOUNTER — Encounter: Payer: Self-pay | Admitting: Emergency Medicine

## 2019-12-11 DIAGNOSIS — M79602 Pain in left arm: Secondary | ICD-10-CM | POA: Diagnosis not present

## 2019-12-11 MED ORDER — TIZANIDINE HCL 2 MG PO TABS: 2.0000 mg | ORAL_TABLET | Freq: Three times a day (TID) | ORAL | 0 refills | Status: AC | PRN

## 2019-12-11 MED ORDER — MELOXICAM 7.5 MG PO TABS: 7.5000 mg | ORAL_TABLET | Freq: Every day | ORAL | 0 refills | Status: AC

## 2019-12-11 NOTE — Discharge Instructions (Signed)
Start Mobic. Do not take ibuprofen (motrin/advil)/ naproxen (aleve) while on mobic. Tizanidine as needed, this can make you drowsy, so do not take if you are going to drive, operate heavy machinery, or make important decisions. Ice/heat compresses as needed. Follow up with PCP/orthopedics if symptoms worsen, changes for reevaluation.

## 2019-12-11 NOTE — ED Provider Notes (Signed)
[facility]    CSN: [id number] Arrival date & time: 12/11/19  1310      History   Chief Complaint Chief Complaint  Patient presents with  . Arm Pain    HPI Alisha Stephenson is a 54 y.o. female.   54 year old female with history of IDDM, asthma, comes in for 1 month history of left upper arm pain.  States shortly prior to symptom onset, her diabetic sensor on left upper arm which ripped out at the grocery store.  States needle from sensor was completely removed.  Had pain to the area, but now with pain throughout the whole upper arm, radiating to the back.  Denies numbness, tingling, loss of grip strength.     Past Medical History:  Diagnosis Date  . Asthma   . Diabetes mellitus without complication ([place])   . Diverticulitis   . Gallstones   . IBS (irritable bowel syndrome)   . NAFLD (nonalcoholic fatty liver disease)    CT scan 2015  . Ovarian cyst     Patient Active Problem List   Diagnosis Date Noted  . Diabetes mellitus type 1.5, managed as type 1 ([place]) 10/12/2018  . NAFLD (nonalcoholic fatty liver disease)   . Seborrheic keratoses 03/23/2018  . Allergic rhinitis 11/14/2014  . Asthma in adult 11/14/2014  . IBS (irritable bowel syndrome) 11/14/2014    Past Surgical History:  Procedure Laterality Date  . ABDOMINAL HYSTERECTOMY    . CESAREAN SECTION    . CHOLECYSTECTOMY    . COLONOSCOPY     about 2011    OB History   No obstetric history on file.      Home Medications    Prior to Admission medications   Medication Sig Start Date End Date Taking? Authorizing Provider  albuterol (PROVENTIL HFA;VENTOLIN HFA) 108 (90 Base) MCG/ACT inhaler Inhale 2 puffs into the lungs every 6 (six) hours as needed for wheezing or shortness of breath. 03/23/18   Shelda Pal, DO  Blood Glucose Monitoring Suppl (ONETOUCH VERIO) w/Device KIT Test as directed once weekly to check blood sugar. 04/25/18   Shelda Pal, DO  dicyclomine (BENTYL)  10 MG capsule Take 1 capsule (10 mg total) by mouth 4 (four) times daily -  before meals and at bedtime. 09/13/19   Shelda Pal, DO  fluticasone (FLONASE) 50 MCG/ACT nasal spray PLACE 2 SPRAYS INTO BOTH NOSTRILS DAILY. 08/24/18   Saguier, Percell Miller, PA-C  glucose blood [facility] VERIO) test strip USE STRIP TO CHECK GLUCOSE THREE TIMES DAILY 08/31/18   Nani Ravens, Crosby Oyster, DO  insulin lispro (HUMALOG) 100 UNIT/ML KwikPen 15 units with each meal + 8 g/1 unit sliding scale. 06/13/19   Shelda Pal, DO  meloxicam (MOBIC) 7.5 MG tablet Take 1 tablet (7.5 mg total) by mouth daily. 12/11/19   Tasia Catchings, Conley Delisle V, PA-C  ONE TOUCH LANCETS MISC Use once a week to check blood sugar. 04/25/18   Shelda Pal, DO  tiZANidine (ZANAFLEX) 2 MG tablet Take 1 tablet (2 mg total) by mouth every 8 (eight) hours as needed for muscle spasms. 12/11/19   Marvel Mcphillips V, PA-C  TOUJEO MAX SOLOSTAR 300 UNIT/ML Solostar Pen INJECT 62 UNITS INTO THE SKIN 2 (TWO) TIMES DAILY. 11/24/19   Shelda Pal, DO    Family History Family History  Problem Relation Age of Onset  . Diabetes Father   . Heart disease Father   . Breast cancer Maternal Grandmother   . Breast cancer Maternal Aunt   .  Colon cancer Neg Hx   . Esophageal cancer Neg Hx     Social History Social History   Tobacco Use  . Smoking status: Former Research scientist (life sciences)  . Smokeless tobacco: Never Used  . Tobacco comment: quit over 10 years ago  Vaping Use  . Vaping Use: Never used  Substance Use Topics  . Alcohol use: No    Comment: rarely  . Drug use: No     Allergies   Sulfa antibiotics   Review of Systems Review of Systems  Reason unable to perform ROS: See HPI as above.     Physical Exam Triage Vital Signs ED Triage Vitals [12/11/19 1352]  Enc Vitals Group     BP (!) 174/98     Pulse Rate 89     Resp 18     Temp 98.5 F (36.9 C)     Temp Source Oral     SpO2 96 %     Weight      Height      Head Circumference      Peak  Flow      Pain Score 4     Pain Loc      Pain Edu?      Excl. in [place]?    No data found.  Updated Vital Signs BP (!) 174/98 (BP Location: Left Wrist)   Pulse 89   Temp 98.5 F (36.9 C) (Oral)   Resp 18   SpO2 96%   Physical Exam Constitutional:      General: She is not in acute distress.    Appearance: Normal appearance. She is well-developed. She is not toxic-appearing or diaphoretic.  HENT:     Head: Normocephalic and atraumatic.  Eyes:     Conjunctiva/sclera: Conjunctivae normal.     Pupils: Pupils are equal, round, and reactive to light.  Pulmonary:     Effort: Pulmonary effort is normal. No respiratory distress.     Comments: Speaking in full sentences without difficulty Musculoskeletal:     Cervical back: Normal range of motion and neck supple.     Comments: No swelling, erythema, warmth, contusion seen.  Diffuse tenderness to palpation of left upper arm, shoulder, thoracic back.  Full range of motion of BUE. Strength 5/5. Sensation intact. Radial pulse 2+  Skin:    General: Skin is warm and dry.  Neurological:     Mental Status: She is alert and oriented to person, place, and time.      UC Treatments / Results  Labs (all labs ordered are listed, but only abnormal results are displayed) Labs Reviewed - No data to display  EKG   Radiology No results found.  Procedures Procedures (including critical care time)  Medications Ordered in UC Medications - No data to display  Initial Impression / Assessment and Plan / UC Course  I have reviewed the triage vital signs and the nursing notes.  Pertinent labs & imaging results that were available during my care of the patient were reviewed by me and considered in my medical decision making (see chart for details).    NSAID as directed. Muscle relaxant as needed. Ice/heat compresses. Recheck with PCP as scheduled. Return precautions given.   Final Clinical Impressions(s) / UC Diagnoses   Final diagnoses:    Left arm pain    ED Prescriptions    Medication Sig Dispense Auth. Provider   meloxicam (MOBIC) 7.5 MG tablet Take 1 tablet (7.5 mg total) by mouth daily. 14 tablet Ok Edwards,  PA-C   tiZANidine (ZANAFLEX) 2 MG tablet Take 1 tablet (2 mg total) by mouth every 8 (eight) hours as needed for muscle spasms. 15 tablet Ok Edwards, PA-C     PDMP not reviewed this encounter.   Ok Edwards, PA-C 12/11/19 1423

## 2019-12-11 NOTE — ED Triage Notes (Addendum)
Pt presents to [facility] for assessment of left upper arm pain x 1 month after her diabetic sensor on her left posterior upper arm was ripped out at the grocery store.  Patient states there was some pain while it was in her arm, but much worse now.  C/o increasing area of radiation to the back and lower arm.  Pt states pain is worse with movement, like driving.

## 2019-12-18 ENCOUNTER — Other Ambulatory Visit: Payer: Self-pay

## 2019-12-18 ENCOUNTER — Encounter: Payer: Self-pay | Admitting: Family Medicine

## 2019-12-18 ENCOUNTER — Ambulatory Visit: Payer: No Typology Code available for payment source | Admitting: Family Medicine

## 2019-12-18 VITALS — BP 120/76 | HR 86 | Temp 97.1°F | Ht 64.0 in | Wt 285.2 lb

## 2019-12-18 DIAGNOSIS — S46812A Strain of other muscles, fascia and tendons at shoulder and upper arm level, left arm, initial encounter: Secondary | ICD-10-CM | POA: Diagnosis not present

## 2019-12-18 DIAGNOSIS — M25512 Pain in left shoulder: Secondary | ICD-10-CM | POA: Diagnosis not present

## 2019-12-18 NOTE — Patient Instructions (Signed)
Heat (pad or rice pillow in microwave) over affected area, 10-15 minutes twice daily.   Ice/cold pack over area for 10-15 min twice daily.  OK to take Tylenol 1000 mg (2 extra strength tabs) or 975 mg (3 regular strength tabs) every 6 hours as needed.  Take the meloxicam. If you want to use the muscle relaxant, feel free, but be mindful of drowsiness.   Trapezius stretches/exercises Do exercises exactly as told by your health care provider and adjust them as directed. It is normal to feel mild stretching, pulling, tightness, or discomfort as you do these exercises, but you should stop right away if you feel sudden pain or your pain gets worse.  Stretching and range of motion exercises These exercises warm up your muscles and joints and improve the movement and flexibility of your shoulder. These exercises can also help to relieve pain, numbness, and tingling. If you are unable to do any of the following for any reason, do not further attempt to do it.   Exercise A: Flexion, standing    1. Stand and hold a broomstick, a cane, or a similar object. Place your hands a little more than shoulder-width apart on the object. Your left / right hand should be palm-up, and your other hand should be palm-down. 2. Push the stick to raise your left / right arm out to your side and then over your head. Use your other hand to help move the stick. Stop when you feel a stretch in your shoulder, or when you reach the angle that is recommended by your health care provider. ? Avoid shrugging your shoulder while you raise your arm. Keep your shoulder blade tucked down toward your spine. 3. Hold for 30 seconds. 4. Slowly return to the starting position. Repeat 2 times. Complete this exercise 3 times per week.  Exercise B: Abduction, supine    1. Lie on your back and hold a broomstick, a cane, or a similar object. Place your hands a little more than shoulder-width apart on the object. Your left / right hand  should be palm-up, and your other hand should be palm-down. 2. Push the stick to raise your left / right arm out to your side and then over your head. Use your other hand to help move the stick. Stop when you feel a stretch in your shoulder, or when you reach the angle that is recommended by your health care provider. ? Avoid shrugging your shoulder while you raise your arm. Keep your shoulder blade tucked down toward your spine. 3. Hold for 30 seconds. 4. Slowly return to the starting position. Repeat 2 times. Complete this exercise 3 times per week.  Exercise C: Flexion, active-assisted    1. Lie on your back. You may bend your knees for comfort. 2. Hold a broomstick, a cane, or a similar object. Place your hands about shoulder-width apart on the object. Your palms should face toward your feet. 3. Raise the stick and move your arms over your head and behind your head, toward the floor. Use your healthy arm to help your left / right arm move farther. Stop when you feel a gentle stretch in your shoulder, or when you reach the angle where your health care provider tells you to stop. 4. Hold for 30 seconds. 5. Slowly return to the starting position. Repeat 2 times. Complete this exercise 3 times per week.  Exercise D: External rotation and abduction    1. Stand in a door frame with one  of your feet slightly in front of the other. This is called a staggered stance. 2. Choose one of the following positions as told by your health care provider: ? Place your hands and forearms on the door frame above your head. ? Place your hands and forearms on the door frame at the height of your head. ? Place your hands on the door frame at the height of your elbows. 3. Slowly move your weight onto your front foot until you feel a stretch across your chest and in the front of your shoulders. Keep your head and chest upright and keep your abdominal muscles tight. 4. Hold for 30 seconds. 5. To release the  stretch, shift your weight to your back foot. Repeat 2 times. Complete this stretch 3 times per week.  Strengthening exercises These exercises build strength and endurance in your shoulder. Endurance is the ability to use your muscles for a long time, even after your muscles get tired. Exercise E: Scapular depression and adduction  1. Sit on a stable chair. Support your arms in front of you with pillows, armrests, or a tabletop. Keep your elbows in line with the sides of your body. 2. Gently move your shoulder blades down toward your middle back. Relax the muscles on the tops of your shoulders and in the back of your neck. 3. Hold for 3 seconds. 4. Slowly release the tension and relax your muscles completely before doing this exercise again. Repeat for a total of 10 repetitions. 5. After you have practiced this exercise, try doing the exercise without the arm support. Then, try the exercise while standing instead of sitting. Repeat 2 times. Complete this exercise 3 times per week.  Exercise F: Shoulder abduction, isometric    1. Stand or sit about 4-6 inches (10-15 cm) from a wall with your left / right side facing the wall. 2. Bend your left / right elbow and gently press your elbow against the wall. 3. Increase the pressure slowly until you are pressing as hard as you can without shrugging your shoulder. 4. Hold for 3 seconds. 5. Slowly release the tension and relax your muscles completely. Repeat for a total of 10 repetitions. Repeat 2 times. Complete this exercise 3 times per week.  Exercise G: Shoulder flexion, isometric    1. Stand or sit about 4-6 inches (10-15 cm) away from a wall with your left / right side facing the wall. 2. Keep your left / right elbow straight and gently press the top of your fist against the wall. Increase the pressure slowly until you are pressing as hard as you can without shrugging your shoulder. 3. Hold for 10-15 seconds. 4. Slowly release the tension  and relax your muscles completely. Repeat for a total of 10 repetitions. Repeat 2 times. Complete this exercise 3 times per week.  Exercise H: Internal rotation    1. Sit in a stable chair without armrests, or stand. Secure an exercise band at your left / right side, at elbow height. 2. Place a soft object, such as a folded towel or a small pillow, under your left / right upper arm so your elbow is a few inches (about 8 cm) away from your side. 3. Hold the end of the exercise band so the band stretches. 4. Keeping your elbow pressed against the soft object under your arm, move your forearm across your body toward your abdomen. Keep your body steady so the movement is only coming from your shoulder. 5. Hold  for 3 seconds. 6. Slowly return to the starting position. Repeat for a total of 10 repetitions. Repeat 2 times. Complete this exercise 3 times per week.  Exercise I: External rotation    1. Sit in a stable chair without armrests, or stand. 2. Secure an exercise band at your left / right side, at elbow height. 3. Place a soft object, such as a folded towel or a small pillow, under your left / right upper arm so your elbow is a few inches (about 8 cm) away from your side. 4. Hold the end of the exercise band so the band stretches. 5. Keeping your elbow pressed against the soft object under your arm, move your forearm out, away from your abdomen. Keep your body steady so the movement is only coming from your shoulder. 6. Hold for 3 seconds. 7. Slowly return to the starting position. Repeat for a total of 10 repetitions. Repeat 2 times. Complete this exercise 3 times per week. Exercise J: Shoulder extension  1. Sit in a stable chair without armrests, or stand. Secure an exercise band to a stable object in front of you so the band is at shoulder height. 2. Hold one end of the exercise band in each hand. Your palms should face each other. 3. Straighten your elbows and lift your hands up to  shoulder height. 4. Step back, away from the secured end of the exercise band, until the band stretches. 5. Squeeze your shoulder blades together and pull your hands down to the sides of your thighs. Stop when your hands are straight down by your sides. Do not let your hands go behind your body. 6. Hold for 3 seconds. 7. Slowly return to the starting position. Repeat for a total of 10 repetitions. Repeat 2 times. Complete this exercise 3 times per week.  Exercise K: Shoulder extension, prone    1. Lie on your abdomen on a firm surface so your left / right arm hangs over the edge. 2. Hold a 5 lb weight in your hand so your palm faces in toward your body. Your arm should be straight. 3. Squeeze your shoulder blade down toward the middle of your back. 4. Slowly raise your arm behind you, up to the height of the surface that you are lying on. Keep your arm straight. 5. Hold for 3 seconds. 6. Slowly return to the starting position and relax your muscles. Repeat for a total of 10 repetitions. Repeat 2 times. Complete this exercise 3 times per week.   Exercise L: Horizontal abduction, prone  1. Lie on your abdomen on a firm surface so your left / right arm hangs over the edge. 2. Hold a 5 lb weight in your hand so your palm faces toward your feet. Your arm should be straight. 3. Squeeze your shoulder blade down toward the middle of your back. 4. Bend your elbow so your hand moves up, until your elbow is bent to an "L" shape (90 degrees). With your elbow bent, slowly move your forearm forward and up. Raise your hand up to the height of the surface that you are lying on. ? Your upper arm should not move, and your elbow should stay bent. ? At the top of the movement, your palm should face the floor. 5. Hold for 3 seconds. 6. Slowly return to the starting position and relax your muscles. Repeat for a total of 10 repetitions. Repeat 2 times. Complete this exercise 3 times per week.  Exercise M:  Horizontal abduction, standing  1. Sit on a stable chair, or stand. 2. Secure an exercise band to a stable object in front of you so the band is at shoulder height. 3. Hold one end of the exercise band in each hand. 4. Straighten your elbows and lift your hands straight in front of you, up to shoulder height. Your palms should face down, toward the floor. 5. Step back, away from the secured end of the exercise band, until the band stretches. 6. Move your arms out to your sides, and keep your arms straight. 7. Hold for 3 seconds. 8. Slowly return to the starting position. Repeat for a total of 10 repetitions. Repeat 2 times. Complete this exercise 3 times per week.  Exercise N: Scapular retraction and elevation  1. Sit on a stable chair, or stand. 2. Secure an exercise band to a stable object in front of you so the band is at shoulder height. 3. Hold one end of the exercise band in each hand. Your palms should face each other. 4. Sit in a stable chair without armrests, or stand. 5. Step back, away from the secured end of the exercise band, until the band stretches. 6. Squeeze your shoulder blades together and lift your hands over your head. Keep your elbows straight. 7. Hold for 3 seconds. 8. Slowly return to the starting position. Repeat for a total of 10 repetitions. Repeat 2 times. Complete this exercise 3 times per week.  This information is not intended to replace advice given to you by your health care provider. Make sure you discuss any questions you have with your health care provider. Document Released: 05/25/2005 Document Revised: 01/30/2016 Document Reviewed: 04/11/2015 Elsevier Interactive Patient Education  2017 [place] (ROM) AND STRETCHING EXERCISES These exercises may help you when beginning to rehabilitate your injury. While completing these exercises, remember:   Restoring tissue flexibility helps normal motion to return to the joints.  This allows healthier, less painful movement and activity.  An effective stretch should be held for at least 30 seconds.  A stretch should never be painful. You should only feel a gentle lengthening or release in the stretched tissue.  ROM - Pendulum  Bend at the waist so that your right / left arm falls away from your body. Support yourself with your opposite hand on a solid surface, such as a table or a countertop.  Your right / left arm should be perpendicular to the ground. If it is not perpendicular, you need to lean over farther. Relax the muscles in your right / left arm and shoulder as much as possible.  Gently sway your hips and trunk so they move your right / left arm without any use of your right / left shoulder muscles.  Progress your movements so that your right / left arm moves side to side, then forward and backward, and finally, both clockwise and counterclockwise.  Complete 10-15 repetitions in each direction. Many people use this exercise to relieve discomfort in their shoulder as well as to gain range of motion. Repeat 2 times. Complete this exercise 3 times per week.  STRETCH - Flexion, Standing  Stand with good posture. With an underhand grip on your right / left hand and an overhand grip on the opposite hand, grasp a broomstick or cane so that your hands are a little more than shoulder-width apart.  Keeping your right / left elbow straight and shoulder muscles relaxed, push the stick with  your opposite hand to raise your right / left arm in front of your body and then overhead. Raise your arm until you feel a stretch in your right / left shoulder, but before you have increased shoulder pain.  Try to avoid shrugging your right / left shoulder as your arm rises by keeping your shoulder blade tucked down and toward your mid-back spine. Hold 30 seconds.  Slowly return to the starting position. Repeat 2 times. Complete this exercise 3 times per week.  STRETCH - Internal  Rotation  Place your right / left hand behind your back, palm-up.  Throw a towel or belt over your opposite shoulder. Grasp the towel/belt with your right / left hand.  While keeping an upright posture, gently pull up on the towel/belt until you feel a stretch in the front of your right / left shoulder.  Avoid shrugging your right / left shoulder as your arm rises by keeping your shoulder blade tucked down and toward your mid-back spine.  Hold 30. Release the stretch by lowering your opposite hand. Repeat 2 times. Complete this exercise 3 times per week.  STRETCH - External Rotation and Abduction  Stagger your stance through a doorframe. It does not matter which foot is forward.  As instructed by your physician, physical therapist or athletic trainer, place your hands: ? And forearms above your head and on the door frame. ? And forearms at head-height and on the door frame. ? At elbow-height and on the door frame.  Keeping your head and chest upright and your stomach muscles tight to prevent over-extending your low-back, slowly shift your weight onto your front foot until you feel a stretch across your chest and/or in the front of your shoulders.  Hold 30 seconds. Shift your weight to your back foot to release the stretch. Repeat 2 times. Complete this stretch 3 times per week.   STRENGTHENING EXERCISES  These exercises may help you when beginning to rehabilitate your injury. They may resolve your symptoms with or without further involvement from your physician, physical therapist or athletic trainer. While completing these exercises, remember:   Muscles can gain both the endurance and the strength needed for everyday activities through controlled exercises.  Complete these exercises as instructed by your physician, physical therapist or athletic trainer. Progress the resistance and repetitions only as guided.  You may experience muscle soreness or fatigue, but the pain or  discomfort you are trying to eliminate should never worsen during these exercises. If this pain does worsen, stop and make certain you are following the directions exactly. If the pain is still present after adjustments, discontinue the exercise until you can discuss the trouble with your clinician.  If advised by your physician, during your recovery, avoid activity or exercises which involve actions that place your right / left hand or elbow above your head or behind your back or head. These positions stress the tissues which are trying to heal.  STRENGTH - Scapular Depression and Adduction  With good posture, sit on a firm chair. Supported your arms in front of you with pillows, arm rests or a table top. Have your elbows in line with the sides of your body.  Gently draw your shoulder blades down and toward your mid-back spine. Gradually increase the tension without tensing the muscles along the top of your shoulders and the back of your neck.  Hold for 3 seconds. Slowly release the tension and relax your muscles completely before completing the next repetition.  After  you have practiced this exercise, remove the arm support and complete it in standing as well as sitting. Repeat 2 times. Complete this exercise 3 times per week.   STRENGTH - External Rotators  Secure a rubber exercise band/tubing to a fixed object so that it is at the same height as your right / left elbow when you are standing or sitting on a firm surface.  Stand or sit so that the secured exercise band/tubing is at your side that is not injured.  Bend your elbow 90 degrees. Place a folded towel or small pillow under your right / left arm so that your elbow is a few inches away from your side.  Keeping the tension on the exercise band/tubing, pull it away from your body, as if pivoting on your elbow. Be sure to keep your body steady so that the movement is only coming from your shoulder rotating.  Hold 3 seconds. Release  the tension in a controlled manner as you return to the starting position. Repeat 2 times. Complete this exercise 3 times per week.   STRENGTH - Supraspinatus  Stand or sit with good posture. Grasp a 2-3 lb weight or an exercise band/tubing so that your hand is "thumbs-up," like when you shake hands.  Slowly lift your right / left hand from your thigh into the air, traveling about 30 degrees from straight out at your side. Lift your hand to shoulder height or as far as you can without increasing any shoulder pain. Initially, many people do not lift their hands above shoulder height.  Avoid shrugging your right / left shoulder as your arm rises by keeping your shoulder blade tucked down and toward your mid-back spine.  Hold for 3 seconds. Control the descent of your hand as you slowly return to your starting position. Repeat 2 times. Complete this exercise 3 times per week.   STRENGTH - Shoulder Extensors  Secure a rubber exercise band/tubing so that it is at the height of your shoulders when you are either standing or sitting on a firm arm-less chair.  With a thumbs-up grip, grasp an end of the band/tubing in each hand. Straighten your elbows and lift your hands straight in front of you at shoulder height. Step back away from the secured end of band/tubing until it becomes tense.  Squeezing your shoulder blades together, pull your hands down to the sides of your thighs. Do not allow your hands to go behind you.  Hold for 3 seconds. Slowly ease the tension on the band/tubing as you reverse the directions and return to the starting position. Repeat 2 times. Complete this exercise 3 times per week.   STRENGTH - Scapular Retractors  Secure a rubber exercise band/tubing so that it is at the height of your shoulders when you are either standing or sitting on a firm arm-less chair.  With a palm-down grip, grasp an end of the band/tubing in each hand. Straighten your elbows and lift your hands  straight in front of you at shoulder height. Step back away from the secured end of band/tubing until it becomes tense.  Squeezing your shoulder blades together, draw your elbows back as you bend them. Keep your upper arm lifted away from your body throughout the exercise.  Hold 3 seconds. Slowly ease the tension on the band/tubing as you reverse the directions and return to the starting position. Repeat 2 times. Complete this exercise 3 times per week.  STRENGTH - Scapular Depressors  Find a sturdy chair  without wheels, such as a from a dining room table.  Keeping your feet on the floor, lift your bottom from the seat and lock your elbows.  Keeping your elbows straight, allow gravity to pull your body weight down. Your shoulders will rise toward your ears.  Raise your body against gravity by drawing your shoulder blades down your back, shortening the distance between your shoulders and ears. Although your feet should always maintain contact with the floor, your feet should progressively support less body weight as you get stronger.  Hold 3 seconds. In a controlled and slow manner, lower your body weight to begin the next repetition. Repeat 2 times. Complete this exercise 3 times per week.   This information is not intended to replace advice given to you by your health care provider. Make sure you discuss any questions you have with your health care provider.  Document Released: 04/08/2005 Document Revised: 06/15/2014 Document Reviewed: 09/06/2008 Elsevier Interactive Patient Education [employer].

## 2019-12-18 NOTE — Progress Notes (Signed)
Musculoskeletal Exam  Patient: Alisha Stephenson DOB: 1966-01-26  DOS: 12/18/2019  SUBJECTIVE:  Chief Complaint:   Chief Complaint  Patient presents with  . Arm Pain    left arm pain not sleeping due to pain    Alisha Stephenson is a 54 y.o.  female for evaluation and treatment of L arm pain.   Onset:  6 weeks ago. Dexcom sensor came out unexpectedly and it really hurt Location:  L upper arm Character:  aching  Progression of issue:  is unchanged Associated symptoms: swelling; no redness, bruising, decreased ROM Treatment: to date has been ice, Tylenol, OTC NSAIDS and heat.   Neurovascular symptoms: no  Past Medical History:  Diagnosis Date  . Asthma   . Diabetes mellitus without complication ([place])   . Diverticulitis   . Gallstones   . IBS (irritable bowel syndrome)   . NAFLD (nonalcoholic fatty liver disease)    CT scan 2015  . Ovarian cyst     Objective: VITAL SIGNS: BP 120/76 (BP Location: Left Arm, Patient Position: Sitting, Cuff Size: Normal)   Pulse 86   Temp (!) 97.1 F (36.2 C) (Oral)   Ht 5\' 4"  (1.626 m)   Wt 285 lb 4 oz (129.4 kg)   SpO2 97%   BMI 48.96 kg/m  Constitutional: Well formed, well developed. No acute distress. Thorax & Lungs: No accessory muscle use Musculoskeletal: LUE.   Normal active range of motion: yes.   Normal passive range of motion: yes Tenderness to palpation: yes over lateral deltoid and trap Deformity: no Ecchymosis: no Tests positive: Neer's Tests negative: Hawkins, lift off, cross over, Spurling s Neurologic: Normal sensory function. No focal deficits noted. DTR's equal and symmetric in UE's. No clonus. Psychiatric: Normal mood. Age appropriate judgment and insight. Alert & oriented x 3.    Assessment:  Left shoulder pain, unspecified chronicity  Strain of left trapezius muscle, initial encounter  Plan: Stretches/exercises, heat, ice, Tylenol. OK to take muscle relaxer and meloxicam. If no improvement over next 2-3  weeks, will set up with PT if slight improving and sports med if no improvement at all.  F/u as originally scheduled. The patient voiced understanding and agreement to the plan.   [name], DO 12/18/19  8:12 AM

## 2019-12-21 MED FILL — HUMALOG 100 UNITS/ML KWIKPE: 100 | 30 days supply | Qty: 15 | Fill #9

## 2020-01-02 MED ORDER — AZITHROMYCIN 250 MG PO TABS
ORAL_TABLET | ORAL | 0 refills | Status: AC
Start: 2020-01-02 — End: ?

## 2020-01-06 ENCOUNTER — Other Ambulatory Visit: Payer: Self-pay

## 2020-01-06 ENCOUNTER — Emergency Department (HOSPITAL_BASED_OUTPATIENT_CLINIC_OR_DEPARTMENT_OTHER): Payer: PRIVATE HEALTH INSURANCE

## 2020-01-06 ENCOUNTER — Encounter (HOSPITAL_BASED_OUTPATIENT_CLINIC_OR_DEPARTMENT_OTHER): Payer: Self-pay | Admitting: Emergency Medicine

## 2020-01-06 ENCOUNTER — Emergency Department (HOSPITAL_BASED_OUTPATIENT_CLINIC_OR_DEPARTMENT_OTHER)
Admission: EM | Admit: 2020-01-06 | Discharge: 2020-01-06 | Disposition: A | Discharge status: Home / Self Care | Payer: PRIVATE HEALTH INSURANCE | Source: Home / Self Care | Attending: Emergency Medicine | Admitting: Emergency Medicine

## 2020-01-06 ENCOUNTER — Telehealth: Payer: Self-pay | Admitting: Infectious Diseases

## 2020-01-06 DIAGNOSIS — G4485 Primary stabbing headache: Secondary | ICD-10-CM | POA: Insufficient documentation

## 2020-01-06 DIAGNOSIS — Z87891 Personal history of nicotine dependence: Secondary | ICD-10-CM | POA: Insufficient documentation

## 2020-01-06 DIAGNOSIS — R0902 Hypoxemia: Secondary | ICD-10-CM | POA: Diagnosis not present

## 2020-01-06 DIAGNOSIS — J45909 Unspecified asthma, uncomplicated: Secondary | ICD-10-CM | POA: Insufficient documentation

## 2020-01-06 DIAGNOSIS — Z794 Long term (current) use of insulin: Secondary | ICD-10-CM | POA: Insufficient documentation

## 2020-01-06 DIAGNOSIS — U071 COVID-19: Secondary | ICD-10-CM

## 2020-01-06 DIAGNOSIS — E119 Type 2 diabetes mellitus without complications: Secondary | ICD-10-CM | POA: Insufficient documentation

## 2020-01-06 DIAGNOSIS — Z79899 Other long term (current) drug therapy: Secondary | ICD-10-CM | POA: Insufficient documentation

## 2020-01-06 DIAGNOSIS — H6121 Impacted cerumen, right ear: Secondary | ICD-10-CM | POA: Insufficient documentation

## 2020-01-06 DIAGNOSIS — Z7951 Long term (current) use of inhaled steroids: Secondary | ICD-10-CM | POA: Insufficient documentation

## 2020-01-06 DIAGNOSIS — R519 Headache, unspecified: Secondary | ICD-10-CM

## 2020-01-06 DIAGNOSIS — A4189 Other specified sepsis: Secondary | ICD-10-CM | POA: Diagnosis not present

## 2020-01-06 DIAGNOSIS — R059 Cough, unspecified: Secondary | ICD-10-CM

## 2020-01-06 LAB — PROTIME-INR
INR: 1 (ref 0.8–1.2)
Prothrombin Time: 12.6 seconds (ref 11.4–15.2)

## 2020-01-06 LAB — CBC WITH DIFFERENTIAL/PLATELET
Abs Immature Granulocytes: 0.04 10*3/uL (ref 0.00–0.07)
Basophils Absolute: 0 10*3/uL (ref 0.0–0.1)
Basophils Relative: 1 %
Eosinophils Absolute: 0 10*3/uL (ref 0.0–0.5)
Eosinophils Relative: 1 %
HCT: 45.7 % (ref 36.0–46.0)
Hemoglobin: 15 g/dL (ref 12.0–15.0)
Immature Granulocytes: 2 %
Lymphocytes Relative: 29 %
Lymphs Abs: 0.7 10*3/uL (ref 0.7–4.0)
MCH: 28.5 pg (ref 26.0–34.0)
MCHC: 32.8 g/dL (ref 30.0–36.0)
MCV: 86.7 fL (ref 80.0–100.0)
Monocytes Absolute: 0.1 10*3/uL (ref 0.1–1.0)
Monocytes Relative: 6 %
Neutro Abs: 1.6 10*3/uL — ABNORMAL LOW (ref 1.7–7.7)
Neutrophils Relative %: 61 %
Platelets: 88 10*3/uL — ABNORMAL LOW (ref 150–400)
RBC: 5.27 MIL/uL — ABNORMAL HIGH (ref 3.87–5.11)
RDW: 13.2 % (ref 11.5–15.5)
WBC: 2.5 10*3/uL — ABNORMAL LOW (ref 4.0–10.5)
nRBC: 0 % (ref 0.0–0.2)

## 2020-01-06 LAB — COMPREHENSIVE METABOLIC PANEL
ALT: 49 U/L — ABNORMAL HIGH (ref 0–44)
AST: 56 U/L — ABNORMAL HIGH (ref 15–41)
Albumin: 3.4 g/dL — ABNORMAL LOW (ref 3.5–5.0)
Alkaline Phosphatase: 104 U/L (ref 38–126)
Anion gap: 12 (ref 5–15)
BUN: 13 mg/dL (ref 6–20)
CO2: 26 mmol/L (ref 22–32)
Calcium: 8.2 mg/dL — ABNORMAL LOW (ref 8.9–10.3)
Chloride: 96 mmol/L — ABNORMAL LOW (ref 98–111)
Creatinine, Ser: 0.67 mg/dL (ref 0.44–1.00)
GFR calc Af Amer: >60 mL/min (ref >60)
GFR calc non Af Amer: >60 mL/min (ref >60)
Glucose, Bld: 304 mg/dL — ABNORMAL HIGH (ref 70–99)
Potassium: 3.4 mmol/L — ABNORMAL LOW (ref 3.5–5.1)
Sodium: 134 mmol/L — ABNORMAL LOW (ref 135–145)
Total Bilirubin: 0.5 mg/dL (ref 0.3–1.2)
Total Protein: 7.1 g/dL (ref 6.5–8.1)

## 2020-01-06 LAB — CBG MONITORING, ED: Glucose-Capillary: 293 mg/dL — ABNORMAL HIGH (ref 70–99)

## 2020-01-06 MED ORDER — DROPERIDOL 2.5 MG/ML IJ SOLN
INTRAMUSCULAR | Status: AC
  Filled 2020-01-06: qty 2

## 2020-01-06 MED ORDER — LIDOCAINE VISCOUS HCL 2 % MT SOLN
OROMUCOSAL | Status: AC
  Administered 2020-01-06: 1 mL via OROMUCOSAL
  Filled 2020-01-06: qty 15

## 2020-01-06 MED ORDER — DROPERIDOL 2.5 MG/ML IJ SOLN: 2.5000 mg | Freq: Once | INTRAMUSCULAR | Status: DC

## 2020-01-06 MED ORDER — LACTATED RINGERS IV BOLUS
1000.0000 mL | Freq: Once | INTRAVENOUS | Status: AC
  Administered 2020-01-06: 1000 mL via INTRAVENOUS

## 2020-01-06 MED ORDER — PROCHLORPERAZINE EDISYLATE 10 MG/2ML IJ SOLN
10.0000 mg | Freq: Once | INTRAMUSCULAR | Status: AC
  Administered 2020-01-06: 10 mg via INTRAVENOUS
  Filled 2020-01-06: qty 2

## 2020-01-06 MED ORDER — DROPERIDOL 2.5 MG/ML IJ SOLN: 2.5000 mg | Freq: Once | INTRAMUSCULAR | Status: AC

## 2020-01-06 MED ORDER — DIAZEPAM 2 MG PO TABS
2.0000 mg | ORAL_TABLET | Freq: Once | ORAL | Status: AC
  Administered 2020-01-06: 2 mg via ORAL
  Filled 2020-01-06: qty 1

## 2020-01-06 MED ORDER — MAGNESIUM SULFATE 2 GM/50ML IV SOLN
2.0000 g | Freq: Once | INTRAVENOUS | Status: AC
  Administered 2020-01-06: 2 g via INTRAVENOUS
  Filled 2020-01-06: qty 50

## 2020-01-06 MED ORDER — HYDROCODONE-ACETAMINOPHEN 5-325 MG PO TABS
2.0000 | ORAL_TABLET | Freq: Once | ORAL | Status: AC
  Administered 2020-01-06: 2 via ORAL
  Filled 2020-01-06: qty 2

## 2020-01-06 MED ORDER — DIPHENHYDRAMINE HCL 50 MG/ML IJ SOLN
25.0000 mg | Freq: Once | INTRAMUSCULAR | Status: AC
  Administered 2020-01-06: 25 mg via INTRAVENOUS
  Filled 2020-01-06: qty 1

## 2020-01-06 MED ORDER — IBUPROFEN 600 MG PO TABS
600.0000 mg | ORAL_TABLET | Freq: Three times a day (TID) | ORAL | 0 refills | Status: AC | PRN
Start: 2020-01-06 — End: ?

## 2020-01-06 MED ORDER — HYDROMORPHONE HCL 1 MG/ML IJ SOLN
0.5000 mg | Freq: Once | INTRAMUSCULAR | Status: AC
  Administered 2020-01-06: 0.5 mg via INTRAVENOUS
  Filled 2020-01-06: qty 1

## 2020-01-06 MED ORDER — IBUPROFEN 400 MG PO TABS
600.0000 mg | ORAL_TABLET | Freq: Once | ORAL | Status: AC
  Administered 2020-01-06: 600 mg via ORAL
  Filled 2020-01-06: qty 1

## 2020-01-06 MED ORDER — LIDOCAINE VISCOUS HCL 2 % MT SOLN
2.0000 mL | Freq: Once | OROMUCOSAL | Status: AC
  Administered 2020-01-06: 2 mL via OROMUCOSAL
  Filled 2020-01-06: qty 15

## 2020-01-06 MED ORDER — DROPERIDOL 2.5 MG/ML IJ SOLN
INTRAMUSCULAR | Status: AC
  Administered 2020-01-06: 2.5 mg via INTRAVENOUS
  Filled 2020-01-06: qty 2

## 2020-01-06 MED ORDER — LIDOCAINE VISCOUS HCL 2 % MT SOLN: 1.0000 mL | Freq: Once | OROMUCOSAL | Status: AC

## 2020-01-06 NOTE — ED Notes (Signed)
MRI staff spoke with ED MD, per MRI protocol d/t pt status as COVID + they must wait and scan pt at the end of their schedule and then close down truck and clean.

## 2020-01-06 NOTE — ED Notes (Signed)
Pt refusing ordered pain management, requesting more Vicodin. Primary RN Vivien Rota made aware, will speak with Dr. Rex Kras

## 2020-01-06 NOTE — ED Notes (Signed)
Pt refused Droperidol by Starleen Blue RN. When RN came out of room. Pt put call light on and yelled out " I want to go home". MD informed.

## 2020-01-06 NOTE — Discharge Instructions (Signed)
1.  Your headache was stabbing in nature.  This can be caused by various etiologies.  Often, this is irritation of the nerve.  Your symptoms have resolved after treatment in the emergency department.  Return to the emergency department immediately if you have recurrence of severe headache or pain, changes in your vision such as double vision blurred vision or loss of vision, confusion or imbalance or other concerning symptoms. 2.  You had a large amount of buildup of earwax.  You may use over-the-counter earwax cleaning kits to avoid this condition.  It is possible that pressure from earwax buildup was causing pain in your ear canal and associated headache. 3.  You have COVID-19.  Return to the emergency department if you develop shortness of breath, increased difficulty breathing, your oxygen saturations are dropping or other concerning symptoms. 4.  See your doctor for recheck in the next 3 to 5 days.

## 2020-01-06 NOTE — ED Notes (Signed)
Patient transported to MRI 

## 2020-01-06 NOTE — ED Notes (Addendum)
PT stating headache not any better. Pt yelling in pain. VSS. Alert, tolerating po fluids. Alert. MD to be informed. MD in another pt's room.

## 2020-01-06 NOTE — ED Notes (Signed)
ED Provider at bedside. 

## 2020-01-06 NOTE — ED Triage Notes (Addendum)
Headache since yesterday. COVID+. She had tylenol at 4am.

## 2020-01-06 NOTE — ED Notes (Signed)
PT agreed to Droperidol.

## 2020-01-06 NOTE — ED Notes (Signed)
PT in MRI.

## 2020-01-06 NOTE — Telephone Encounter (Signed)
Patient seen in [facility] with day 9 COVID symptoms. Referral from ER MD.   Hulen Skains to discuss with patient about Covid symptoms and the use of Regeneron, a monoclonal antibody infusion for those with mild to moderate Covid symptoms and at a high risk of hospitalization.  Pt is qualified for this infusion at the [facility] infusion center due to BMI>35   Unable to infuse at [facility] or [facility]. Other nearest site is in [place] from what I can find on [facility] locator based on patient's zip code.   She declined and will continue supportive care at this time. She does have asthma and monitors her POx regularly with results staying 97-99%. She has only had to use her inhaler a small handful of times.    Janene Madeira, MSN, NP-C [facility] [email or web address] Pager: [phone] Office: [phone] [place]: [phone]

## 2020-01-06 NOTE — ED Notes (Signed)
Patient transported to CT 

## 2020-01-06 NOTE — ED Triage Notes (Signed)
Pt seen here earlier this AM for same. Pt reports spasm like HA have returned and are unbearable. Pt took 4 ibuprofen PTA without relief.

## 2020-01-06 NOTE — ED Provider Notes (Addendum)
[facility] EMERGENCY DEPARTMENT Provider Note   CSN: [id number] Arrival date & time: 01/06/20  1339     History Chief Complaint  Patient presents with  . Headache    COVID    Alisha Stephenson is a 54 y.o. female.  54 year old female with past medical history below including type 2 diabetes mellitus, asthma, nonalcoholic fatty liver disease who presents with headache.  The patient was evaluated here earlier today for COVID-19 symptoms that began 9 days ago including diarrhea, cough, congestion, nausea.  Husband has COVID-19 and patient also tested positive.  She was evaluated because she has been having 2 days of intermittent, brief, sharp and severe pains in her head that started on the right side and wraparound to her forehead.  She took melatonin last night which seemed to ease the pain off but today pain has been worse.  She received medications in the ED and initially was significantly improved but when she got home, headache returned.  She has taken ibuprofen at 1 PM without relief.  She denies any vision changes, extremity numbness/weakness, difficulty walking, or history of migraines.  No head trauma.  The history is provided by the patient.  Headache      Past Medical History:  Diagnosis Date  . Asthma   . Diabetes mellitus without complication ([place])   . Diverticulitis   . Gallstones   . IBS (irritable bowel syndrome)   . NAFLD (nonalcoholic fatty liver disease)    CT scan 2015  . Ovarian cyst     Patient Active Problem List   Diagnosis Date Noted  . Diabetes mellitus type 1.5, managed as type 1 ([place]) 10/12/2018  . NAFLD (nonalcoholic fatty liver disease)   . Seborrheic keratoses 03/23/2018  . Allergic rhinitis 11/14/2014  . Asthma in adult 11/14/2014  . IBS (irritable bowel syndrome) 11/14/2014    Past Surgical History:  Procedure Laterality Date  . ABDOMINAL HYSTERECTOMY    . CESAREAN SECTION    . CHOLECYSTECTOMY    . COLONOSCOPY     about 2011      OB History   No obstetric history on file.     Family History  Problem Relation Age of Onset  . Diabetes Father   . Heart disease Father   . Breast cancer Maternal Grandmother   . Breast cancer Maternal Aunt   . Colon cancer Neg Hx   . Esophageal cancer Neg Hx     Social History   Tobacco Use  . Smoking status: Former Research scientist (life sciences)  . Smokeless tobacco: Never Used  . Tobacco comment: quit over 10 years ago  Vaping Use  . Vaping Use: Never used  Substance Use Topics  . Alcohol use: No    Comment: rarely  . Drug use: No    Home Medications Prior to Admission medications   Medication Sig Start Date End Date Taking? Authorizing Provider  albuterol (PROVENTIL HFA;VENTOLIN HFA) 108 (90 Base) MCG/ACT inhaler Inhale 2 puffs into the lungs every 6 (six) hours as needed for wheezing or shortness of breath. 03/23/18   Shelda Pal, DO  azithromycin (ZITHROMAX) 250 MG tablet Take 2 tabs the first day and then 1 tab daily until you run out. 01/02/20   Nani Ravens, Crosby Oyster, DO  Blood Glucose Monitoring Suppl (ONETOUCH VERIO) w/Device KIT Test as directed once weekly to check blood sugar. 04/25/18   Shelda Pal, DO  dicyclomine (BENTYL) 10 MG capsule Take 1 capsule (10 mg total) by mouth 4 (  four) times daily -  before meals and at bedtime. 09/13/19   Shelda Pal, DO  fluticasone (FLONASE) 50 MCG/ACT nasal spray PLACE 2 SPRAYS INTO BOTH NOSTRILS DAILY. 08/24/18   Saguier, Percell Miller, PA-C  glucose blood (ONETOUCH VERIO) test strip USE STRIP TO CHECK GLUCOSE THREE TIMES DAILY 08/31/18   Shelda Pal, DO  ibuprofen (ADVIL) 600 MG tablet Take 1 tablet (600 mg total) by mouth every 8 (eight) hours as needed. 01/06/20   Charlesetta Shanks, MD  insulin lispro (HUMALOG) 100 UNIT/ML KwikPen 15 units with each meal + 8 g/1 unit sliding scale. 06/13/19   Shelda Pal, DO  meloxicam (MOBIC) 7.5 MG tablet Take 1 tablet (7.5 mg total) by mouth daily. 12/11/19    Tasia Catchings, Amy V, PA-C  ONE TOUCH LANCETS MISC Use once a week to check blood sugar. 04/25/18   Shelda Pal, DO  tiZANidine (ZANAFLEX) 2 MG tablet Take 1 tablet (2 mg total) by mouth every 8 (eight) hours as needed for muscle spasms. 12/11/19   Yu, Amy V, PA-C  TOUJEO MAX SOLOSTAR 300 UNIT/ML Solostar Pen INJECT 62 UNITS INTO THE SKIN 2 (TWO) TIMES DAILY. 11/24/19   Shelda Pal, DO    Allergies    Sulfa antibiotics  Review of Systems   Review of Systems  Neurological: Positive for headaches.   All other systems reviewed and are negative except that which was mentioned in HPI  Physical Exam Updated Vital Signs BP (!) 156/94 (BP Location: Right Arm)   Pulse 102   Temp 99.2 F (37.3 C) (Oral)   Resp 20   SpO2 100%   Physical Exam Vitals and nursing note reviewed.  Constitutional:      General: She is not in acute distress.    Appearance: She is well-developed.     Comments: Awake, alert  HENT:     Head: Normocephalic and atraumatic.  Eyes:     Extraocular Movements: Extraocular movements intact.     Conjunctiva/sclera: Conjunctivae normal.     Pupils: Pupils are equal, round, and reactive to light.  Cardiovascular:     Rate and Rhythm: Normal rate and regular rhythm.  Pulmonary:     Effort: Pulmonary effort is normal.  Abdominal:     General: There is no distension.     Palpations: Abdomen is soft.  Musculoskeletal:     Cervical back: Neck supple.  Skin:    General: Skin is warm and dry.  Neurological:     Mental Status: She is alert and oriented to person, place, and time.     Cranial Nerves: No cranial nerve deficit.     Motor: No abnormal muscle tone.     Deep Tendon Reflexes: Reflexes are normal and symmetric.     Comments: Fluent speech, normal finger-to-nose testing, no clonus 5/5 strength and normal sensation x all 4 extremities  Psychiatric:        Mood and Affect: Mood normal.        Speech: Speech normal.        Thought Content: Thought  content normal.        Judgment: Judgment normal.     ED Results / Procedures / Treatments   Labs (all labs ordered are listed, but only abnormal results are displayed) Labs Reviewed  COMPREHENSIVE METABOLIC PANEL - Abnormal; Notable for the following components:      Result Value   Sodium 134 (*)    Potassium 3.4 (*)    Chloride 96 (*)  Glucose, Bld 304 (*)    Calcium 8.2 (*)    Albumin 3.4 (*)    AST 56 (*)    ALT 49 (*)    All other components within normal limits  CBC WITH DIFFERENTIAL/PLATELET - Abnormal; Notable for the following components:   WBC 2.5 (*)    RBC 5.27 (*)    Platelets 88 (*)    Neutro Abs 1.6 (*)    All other components within normal limits  CBG MONITORING, ED - Abnormal; Notable for the following components:   Glucose-Capillary 293 (*)    All other components within normal limits  PROTIME-INR  URINALYSIS, ROUTINE W REFLEX MICROSCOPIC  PROTIME-INR    EKG EKG Interpretation  Date/Time:  Saturday January 06 2020 14:35:24 EDT Ventricular Rate:  90 PR Interval:  160 QRS Duration: 90 QT Interval:  326 QTC Calculation: 398 R Axis:   148 Text Interpretation: Normal sinus rhythm Right axis deviation Pulmonary disease pattern Nonspecific ST and T wave abnormality Abnormal ECG some artifact but overall similar to previous Confirmed by Theotis Burrow [phone]) on 01/06/2020 5:55:21 PM   Radiology CT Head Wo Contrast  Result Date: 01/06/2020 CLINICAL DATA:  Headache EXAM: CT HEAD WITHOUT CONTRAST TECHNIQUE: Contiguous axial images were obtained from the base of the skull through the vertex without intravenous contrast. COMPARISON:  None. FINDINGS: Brain: No evidence of acute infarction, hemorrhage, hydrocephalus, extra-axial collection or mass lesion/mass effect. Vascular: No hyperdense vessel or unexpected calcification. Skull: Normal. Negative for fracture or focal lesion. Sinuses/Orbits: Normal globes and orbits. Small area polypoid mucosal thickening in the  anterior inferior right maxillary sinus. Sinuses otherwise clear. Other: None. IMPRESSION: No intracranial abnormalities. Electronically Signed   By: Lajean Manes M.D.   On: 01/06/2020 09:45   MR ANGIO HEAD WO CONTRAST  Result Date: 01/06/2020 CLINICAL DATA:  Subarachnoid hemorrhage suspected. Additional provided: Subarachnoid hemorrhage suspected, COVID positive with headaches for 9 days. EXAM: MRI HEAD WITHOUT CONTRAST MRA HEAD WITHOUT CONTRAST TECHNIQUE: Multiplanar, multiecho pulse sequences of the brain and surrounding structures were obtained without intravenous contrast. Angiographic images of the head were obtained using MRA technique without contrast. COMPARISON:  Head CT 01/06/2020 FINDINGS: MRI HEAD FINDINGS Brain: Cerebral volume is normal. Minimal scattered T2/FLAIR hyperintensity within the cerebral white matter is nonspecific, but consistent with chronic small vessel ischemic disease. There is no acute infarct. No evidence of intracranial mass. No chronic intracranial blood products. No extra-axial fluid collection. No midline shift. Vascular: Reported below. Skull and upper cervical spine: No focal marrow lesion. Sinuses/Orbits: Visualized orbits show no acute finding. Mild ethmoid sinus mucosal thickening. Small right maxillary sinus mucous retention cyst. No significant mastoid effusion MRA HEAD FINDINGS The intracranial internal carotid arteries are patent. The M1 middle cerebral arteries are patent without significant stenosis. No M2 proximal branch occlusion or high-grade proximal stenosis is identified. 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 The A1 left ACA and more distal anterior cerebral arteries are patent. There is a slightly bulbous appearance of the anterior communicating artery measuring approximately 1 mm (series 103, image 6). This may reflect a tiny aneurysm. The non dominant intracranial left vertebral artery is developmentally diminutive, but patent. The  dominant cranial right vertebral artery is patent without significant stenosis. The basilar artery is developmentally diminutive, but patent without significant stenosis. The posterior cerebral arteries are patent proximally without significant stenosis. The posterior cerebral arteries are predominantly fetal in origin bilaterally. IMPRESSION: MRI brain: 1. No evidence of acute intracranial abnormality. 2. Mild chronic  small vessel ischemic changes within the cerebral white matter MRA head: 1. No intracranial large vessel occlusion or proximal high-grade arterial stenosis. 2. 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 3. There is a slightly bulbous appearance of the anterior communicating artery measuring 1 mm in diameter. This may reflect a tiny aneurysm. 4. The intracranial vertebral and basilar arteries are developmentally diminutive in the setting of predominantly fetal origin posterior cerebral arteries. Electronically Signed   By: Kellie Simmering DO   On: 01/06/2020 19:14   MR Brain Wo Contrast (neuro protocol)  Result Date: 01/06/2020 CLINICAL DATA:  Subarachnoid hemorrhage suspected. Additional provided: Subarachnoid hemorrhage suspected, COVID positive with headaches for 9 days. EXAM: MRI HEAD WITHOUT CONTRAST MRA HEAD WITHOUT CONTRAST TECHNIQUE: Multiplanar, multiecho pulse sequences of the brain and surrounding structures were obtained without intravenous contrast. Angiographic images of the head were obtained using MRA technique without contrast. COMPARISON:  Head CT 01/06/2020 FINDINGS: MRI HEAD FINDINGS Brain: Cerebral volume is normal. Minimal scattered T2/FLAIR hyperintensity within the cerebral white matter is nonspecific, but consistent with chronic small vessel ischemic disease. There is no acute infarct. No evidence of intracranial mass. No chronic intracranial blood products. No extra-axial fluid collection. No midline shift. Vascular: Reported below. Skull and upper cervical  spine: No focal marrow lesion. Sinuses/Orbits: Visualized orbits show no acute finding. Mild ethmoid sinus mucosal thickening. Small right maxillary sinus mucous retention cyst. No significant mastoid effusion MRA HEAD FINDINGS The intracranial internal carotid arteries are patent. The M1 middle cerebral arteries are patent without significant stenosis. No M2 proximal branch occlusion or high-grade proximal stenosis is identified. 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 The A1 left ACA and more distal anterior cerebral arteries are patent. There is a slightly bulbous appearance of the anterior communicating artery measuring approximately 1 mm (series 103, image 6). This may reflect a tiny aneurysm. The non dominant intracranial left vertebral artery is developmentally diminutive, but patent. The dominant cranial right vertebral artery is patent without significant stenosis. The basilar artery is developmentally diminutive, but patent without significant stenosis. The posterior cerebral arteries are patent proximally without significant stenosis. The posterior cerebral arteries are predominantly fetal in origin bilaterally. IMPRESSION: MRI brain: 1. No evidence of acute intracranial abnormality. 2. Mild chronic small vessel ischemic changes within the cerebral white matter MRA head: 1. No intracranial large vessel occlusion or proximal high-grade arterial stenosis. 2. 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 3. There is a slightly bulbous appearance of the anterior communicating artery measuring 1 mm in diameter. This may reflect a tiny aneurysm. 4. The intracranial vertebral and basilar arteries are developmentally diminutive in the setting of predominantly fetal origin posterior cerebral arteries. Electronically Signed   By: Kellie Simmering DO   On: 01/06/2020 19:14   DG Chest Port 1 View  Result Date: 01/06/2020 CLINICAL DATA:  COVID-19 positivity with headaches and chest pain,  initial encounter EXAM: PORTABLE CHEST 1 VIEW COMPARISON:  06/28/2004 FINDINGS: Cardiac shadow is within normal limits. The lungs are well aerated bilaterally. Minimal bibasilar atelectatic changes are seen. No bony abnormality is noted. IMPRESSION: Mild bibasilar atelectasis. Electronically Signed   By: Inez Catalina M.D.   On: 01/06/2020 19:20    Procedures Procedures (including critical care time)  Medications Ordered in ED Medications  lidocaine (XYLOCAINE) 2 % viscous mouth solution 1 mL (1 mL Mouth/Throat Given 01/06/20 1359)  HYDROcodone-acetaminophen (NORCO/VICODIN) 5-325 MG per tablet 2 tablet (2 tablets Oral Given 01/06/20 1404)  lactated ringers bolus 1,000 mL (  0 mLs Intravenous Stopped 01/06/20 2056)  diphenhydrAMINE (BENADRYL) injection 25 mg (25 mg Intravenous Given 01/06/20 1849)  prochlorperazine (COMPAZINE) injection 10 mg (10 mg Intravenous Given 01/06/20 1849)  magnesium sulfate IVPB 2 g 50 mL (0 g Intravenous Stopped 01/06/20 1919)  droperidol (INAPSINE) 2.5 MG/ML injection 2.5 mg (2.5 mg Intravenous Given 01/06/20 2142)  HYDROmorphone (DILAUDID) injection 0.5 mg (0.5 mg Intravenous Given 01/06/20 2256)    ED Course  I have reviewed the triage vital signs and the nursing notes.  Pertinent labs & imaging results that were available during my care of the patient were reviewed by me and considered in my medical decision making (see chart for details).    MDM Rules/Calculators/A&P                          Patient was well-appearing on exam, reassuring vital signs, normal O2 saturation on room air.  Normal neurologic exam.  She had a normal head CT earlier today and when patient checked into the waiting room again, Dr. Johnney Killian who had previously evaluated her ordered MRI brain. Gave migraine cocktail.  Labs show WBC 2.5, PLT 88, AST 56, ALT 49.  These lab derangements are expected with COVID-19 infection.  Glucose 304, normal anion gap, normal creatinine.  MRI/MRA brain negative  acute. Pt continued to have pain, gave repeat round of medications x2. On reassessment, she continues to have occasional shooting pain. I have recommended she go home and continue to hydrate, take antipyretics to control fevers and COVID symptoms. I suspect headaches may be related to current viral symptoms.  I have emphasized the need for close PCP follow-up for reassessment as she would need neurology referral if headaches persisted after improvement of viral symptoms.  I extensively reviewed return precautions with her including any strokelike symptoms.  Also reviewed return precautions regarding COVID-19.  She voiced understanding.  Alisha Stephenson was evaluated in Emergency Department on 01/06/2020 for the symptoms described in the history of present illness. She was evaluated in the context of the global COVID-19 pandemic, which necessitated consideration that the patient might be at risk for infection with the SARS-CoV-2 virus that causes COVID-19. Institutional protocols and algorithms that pertain to the evaluation of patients at risk for COVID-19 are in a state of rapid change based on information released by regulatory bodies including the CDC and federal and state organizations. These policies and algorithms were followed during the patient's care in the ED.  Final Clinical Impression(s) / ED Diagnoses Final diagnoses:  Bad headache  COVID-19 virus infection    Rx / DC Orders ED Discharge Orders    None       Raeana Blinn, Wenda Overland, MD 01/06/20 2343    Rex Kras, Wenda Overland, MD 01/06/20 [phone]

## 2020-01-06 NOTE — ED Provider Notes (Signed)
[facility] Provider Note   CSN: [id number] Arrival date & time: 01/06/20  2130     History Chief Complaint  Patient presents with  . Headache    COVID+    Alisha Stephenson is a 54 y.o. female.  HPI Patient reports he started coming down with symptoms of Covid 9 days ago.  Both she and her husband started getting stomach upset and some diarrhea.  She reports her husband tested positive on the weekend but she did not.  Symptoms progressed however and they both developed some coughing and nasal congestion.  Patient has had nausea but no vomiting.  She has had some ongoing diarrhea and abdominal discomfort.  She reports she has been monitoring her oxygen saturation at home and not getting any episodes of low oxygen.  She denies that she is having significant shortness of breath.  She does have some associated cough.  She was prescribed a course of doxycycline and then subsequently Zithromax by her primary provider.  She reports she is also been taking zinc and vitamin C.  She is been taking Mucinex.  She has an inhaler but has not felt like she has needed it much.  She is only tried it a couple of times.  The symptom that brought the patient to the emergency department, is a lancinating headache.  She reports last night she started getting stabbing pains on the right side of her head.  It is intense and sharp.  It is very brief.  It comes fairly repeatedly.  She reports at first it was more towards her temple in then today seems to be slightly farther back on the same side of the head.  No associated visual changes.  No double vision or loss of vision.  She has had low-grade fever which she has been treating with Tylenol.  No dizziness, no weakness numbness or tingling of extremities.  No difficulty walking.  She reports these recurrent stabbing pains just got to the point where she felt like she could not manage at home anymore.    Past Medical History:  Diagnosis  Date  . Asthma   . Diabetes mellitus without complication ([place])   . Diverticulitis   . Gallstones   . IBS (irritable bowel syndrome)   . NAFLD (nonalcoholic fatty liver disease)    CT scan 2015  . Ovarian cyst     Patient Active Problem List   Diagnosis Date Noted  . Diabetes mellitus type 1.5, managed as type 1 ([place]) 10/12/2018  . NAFLD (nonalcoholic fatty liver disease)   . Seborrheic keratoses 03/23/2018  . Allergic rhinitis 11/14/2014  . Asthma in adult 11/14/2014  . IBS (irritable bowel syndrome) 11/14/2014    Past Surgical History:  Procedure Laterality Date  . ABDOMINAL HYSTERECTOMY    . CESAREAN SECTION    . CHOLECYSTECTOMY    . COLONOSCOPY     about 2011     OB History   No obstetric history on file.     Family History  Problem Relation Age of Onset  . Diabetes Father   . Heart disease Father   . Breast cancer Maternal Grandmother   . Breast cancer Maternal Aunt   . Colon cancer Neg Hx   . Esophageal cancer Neg Hx     Social History   Tobacco Use  . Smoking status: Former Research scientist (life sciences)  . Smokeless tobacco: Never Used  . Tobacco comment: quit over 10 years ago  Vaping Use  .  Vaping Use: Never used  Substance Use Topics  . Alcohol use: No    Comment: rarely  . Drug use: No    Home Medications Prior to Admission medications   Medication Sig Start Date End Date Taking? Authorizing Provider  albuterol (PROVENTIL HFA;VENTOLIN HFA) 108 (90 Base) MCG/ACT inhaler Inhale 2 puffs into the lungs every 6 (six) hours as needed for wheezing or shortness of breath. 03/23/18   Shelda Pal, DO  azithromycin (ZITHROMAX) 250 MG tablet Take 2 tabs the first day and then 1 tab daily until you run out. 01/02/20   Nani Ravens, Crosby Oyster, DO  Blood Glucose Monitoring Suppl (ONETOUCH VERIO) w/Device KIT Test as directed once weekly to check blood sugar. 04/25/18   Shelda Pal, DO  dicyclomine (BENTYL) 10 MG capsule Take 1 capsule (10 mg total) by  mouth 4 (four) times daily -  before meals and at bedtime. 09/13/19   Shelda Pal, DO  fluticasone (FLONASE) 50 MCG/ACT nasal spray PLACE 2 SPRAYS INTO BOTH NOSTRILS DAILY. 08/24/18   Saguier, Percell Miller, PA-C  glucose blood (ONETOUCH VERIO) test strip USE STRIP TO CHECK GLUCOSE THREE TIMES DAILY 08/31/18   Shelda Pal, DO  ibuprofen (ADVIL) 600 MG tablet Take 1 tablet (600 mg total) by mouth every 8 (eight) hours as needed. 01/06/20   Charlesetta Shanks, MD  insulin lispro (HUMALOG) 100 UNIT/ML KwikPen 15 units with each meal + 8 g/1 unit sliding scale. 06/13/19   Shelda Pal, DO  meloxicam (MOBIC) 7.5 MG tablet Take 1 tablet (7.5 mg total) by mouth daily. 12/11/19   Tasia Catchings, Amy V, PA-C  ONE TOUCH LANCETS MISC Use once a week to check blood sugar. 04/25/18   Shelda Pal, DO  tiZANidine (ZANAFLEX) 2 MG tablet Take 1 tablet (2 mg total) by mouth every 8 (eight) hours as needed for muscle spasms. 12/11/19   Yu, Amy V, PA-C  TOUJEO MAX SOLOSTAR 300 UNIT/ML Solostar Pen INJECT 62 UNITS INTO THE SKIN 2 (TWO) TIMES DAILY. 11/24/19   Shelda Pal, DO    Allergies    Sulfa antibiotics  Review of Systems   Review of Systems 10 systems reviewed and negative except as per HPI Physical Exam Updated Vital Signs BP (!) 143/73 (BP Location: Right Arm)   Pulse 57   Temp 98.6 F (37 C) (Oral)   Resp 14   Ht 5' 4"  (1.626 m)   Wt (!) 122.5 kg   SpO2 96%   BMI 46.35 kg/m   Physical Exam Constitutional:      Comments: Alert and nontoxic.  No respiratory distress at rest.  Patient is grimacing with intermittent lancinating head pain.  Mental status is clear.  HENT:     Head: Normocephalic and atraumatic.     Comments: Examination of the scalp and face did not show any lesions or rash.  Patient does have points of reproducible tenderness to palpation on the posterior parietal scalp that seems to trigger pain.    Ears:     Comments: Right TM is cerumen impacted.   After removing large cerumen impaction, TM is clear and translucent with mild serous effusion.    Nose: Nose normal.     Mouth/Throat:     Mouth: Mucous membranes are moist.     Pharynx: Oropharynx is clear.  Eyes:     Extraocular Movements: Extraocular movements intact.     Conjunctiva/sclera: Conjunctivae normal.     Pupils: Pupils are equal, round, and reactive  to light.  Cardiovascular:     Rate and Rhythm: Normal rate and regular rhythm.     Pulses: Normal pulses.     Heart sounds: Normal heart sounds.  Pulmonary:     Effort: Pulmonary effort is normal.     Breath sounds: Normal breath sounds.  Abdominal:     General: There is no distension.     Palpations: Abdomen is soft.     Tenderness: There is no abdominal tenderness. There is no guarding.  Musculoskeletal:        General: No swelling. Normal range of motion.     Cervical back: Neck supple.     Right lower leg: No edema.     Left lower leg: No edema.  Skin:    General: Skin is warm and dry.     Findings: No rash.  Neurological:     General: No focal deficit present.     Mental Status: She is oriented to person, place, and time.     Cranial Nerves: No cranial nerve deficit.     Sensory: No sensory deficit.     Motor: No weakness.     Coordination: Coordination normal.     Gait: Gait normal.  Psychiatric:        Mood and Affect: Mood normal.     ED Results / Procedures / Treatments   Labs (all labs ordered are listed, but only abnormal results are displayed) Labs Reviewed - No data to display  EKG None  Radiology CT Head Wo Contrast  Result Date: 01/06/2020 CLINICAL DATA:  Headache EXAM: CT HEAD WITHOUT CONTRAST TECHNIQUE: Contiguous axial images were obtained from the base of the skull through the vertex without intravenous contrast. COMPARISON:  None. FINDINGS: Brain: No evidence of acute infarction, hemorrhage, hydrocephalus, extra-axial collection or mass lesion/mass effect. Vascular: No hyperdense  vessel or unexpected calcification. Skull: Normal. Negative for fracture or focal lesion. Sinuses/Orbits: Normal globes and orbits. Small area polypoid mucosal thickening in the anterior inferior right maxillary sinus. Sinuses otherwise clear. Other: None. IMPRESSION: No intracranial abnormalities. Electronically Signed   By: Lajean Manes M.D.   On: 01/06/2020 09:45    Procedures .Ear Cerumen Removal  Date/Time: 01/06/2020 11:06 AM Performed by: Charlesetta Shanks, MD Authorized by: Charlesetta Shanks, MD   Consent:    Consent obtained:  Verbal   Consent given by:  Patient   Risks discussed:  Bleeding, infection, incomplete removal, TM perforation, pain and dizziness   Alternatives discussed:  Delayed treatment Procedure details:    Location:  R ear   Procedure type: curette   Post-procedure details:    Inspection:  TM intact   Hearing quality:  Improved   Patient tolerance of procedure:  Tolerated well, no immediate complications Comments:     Disimpaction required several modalities.  First irrigated and removed large portion by curette.  Large residual portion.  Hydrogen peroxide water solution placed.  Subsequent reirrigated and curetted for additional large amount of impacted cerumen.  Once cleared, TM is normal in appearance except for mild serous effusion.  No purulence and no erythema.  Ear canal without abrasions or bleeding.   (including critical care time)  Medications Ordered in ED Medications  ibuprofen (ADVIL) tablet 600 mg (600 mg Oral Given 01/06/20 0647)  lidocaine (XYLOCAINE) 2 % viscous mouth solution 2 mL (2 mLs Mouth/Throat Given 01/06/20 0819)  diazepam (VALIUM) tablet 2 mg (2 mg Oral Given 01/06/20 1108)    ED Course  I have reviewed the triage  vital signs and the nursing notes.  Pertinent labs & imaging results that were available during my care of the patient were reviewed by me and considered in my medical decision making (see chart for details).    MDM  Rules/Calculators/A&P                         Patient presents as outlined.  She had a lancinating headache very suggestive of neuralgia.  Consideration for trigeminal neuralgia however pain is more on the scalp and temple and face.  No other headache symptoms suggestive of infectious source.  Exam did show severe TM impaction on the right side.  This has been disimpacted.  Patient got complete relief of pain with ibuprofen and shenopalatine block.  I instilled 1/2 cc of 2% viscous lidocaine to bilateral posterior naris.  Headache was resolved for well over an hour.  This at time of discharge patient is now reporting recurrence of some lancinating pains.  Will give 1 oral dose of Valium and reassess.  Patient reports she got a couple quick lancinating pains when she got dressed but feels much better and it has not recurred.  At this time she will trial plan of management at home.  Return precautions have been extensively reviewed.  Patient is well in appearance.  She is coordinating all of her activities.  No distress. Final Clinical Impression(s) / ED Diagnoses Final diagnoses:  Primary stabbing headache  Impacted cerumen of right ear  COVID-19    Rx / DC Orders ED Discharge Orders         Ordered    ibuprofen (ADVIL) 600 MG tablet  Every 8 hours PRN     Discontinue  Reprint     01/06/20 1040           Charlesetta Shanks, MD 01/06/20 1115

## 2020-01-08 ENCOUNTER — Inpatient Hospital Stay (HOSPITAL_BASED_OUTPATIENT_CLINIC_OR_DEPARTMENT_OTHER)
Admission: EM | Admit: 2020-01-08 | Discharge: 2020-03-08 | DRG: 003 | Disposition: E | Payer: PRIVATE HEALTH INSURANCE | Attending: Internal Medicine | Admitting: Internal Medicine

## 2020-01-08 ENCOUNTER — Emergency Department (HOSPITAL_BASED_OUTPATIENT_CLINIC_OR_DEPARTMENT_OTHER): Payer: PRIVATE HEALTH INSURANCE

## 2020-01-08 ENCOUNTER — Other Ambulatory Visit: Payer: Self-pay

## 2020-01-08 ENCOUNTER — Encounter (HOSPITAL_BASED_OUTPATIENT_CLINIC_OR_DEPARTMENT_OTHER): Payer: Self-pay | Admitting: *Deleted

## 2020-01-08 DIAGNOSIS — J9621 Acute and chronic respiratory failure with hypoxia: Secondary | ICD-10-CM | POA: Diagnosis not present

## 2020-01-08 DIAGNOSIS — Z794 Long term (current) use of insulin: Secondary | ICD-10-CM

## 2020-01-08 DIAGNOSIS — A419 Sepsis, unspecified organism: Secondary | ICD-10-CM

## 2020-01-08 DIAGNOSIS — A0839 Other viral enteritis: Secondary | ICD-10-CM | POA: Diagnosis present

## 2020-01-08 DIAGNOSIS — F05 Delirium due to known physiological condition: Secondary | ICD-10-CM | POA: Diagnosis not present

## 2020-01-08 DIAGNOSIS — J9600 Acute respiratory failure, unspecified whether with hypoxia or hypercapnia: Secondary | ICD-10-CM | POA: Diagnosis not present

## 2020-01-08 DIAGNOSIS — X58XXXA Exposure to other specified factors, initial encounter: Secondary | ICD-10-CM | POA: Diagnosis not present

## 2020-01-08 DIAGNOSIS — Z0189 Encounter for other specified special examinations: Secondary | ICD-10-CM

## 2020-01-08 DIAGNOSIS — K7200 Acute and subacute hepatic failure without coma: Secondary | ICD-10-CM | POA: Diagnosis not present

## 2020-01-08 DIAGNOSIS — D72810 Lymphocytopenia: Secondary | ICD-10-CM | POA: Diagnosis present

## 2020-01-08 DIAGNOSIS — J1282 Pneumonia due to coronavirus disease 2019: Secondary | ICD-10-CM | POA: Diagnosis present

## 2020-01-08 DIAGNOSIS — Z01818 Encounter for other preprocedural examination: Secondary | ICD-10-CM

## 2020-01-08 DIAGNOSIS — T85598A Other mechanical complication of other gastrointestinal prosthetic devices, implants and grafts, initial encounter: Secondary | ICD-10-CM

## 2020-01-08 DIAGNOSIS — Z23 Encounter for immunization: Secondary | ICD-10-CM

## 2020-01-08 DIAGNOSIS — J189 Pneumonia, unspecified organism: Secondary | ICD-10-CM

## 2020-01-08 DIAGNOSIS — Z515 Encounter for palliative care: Secondary | ICD-10-CM | POA: Diagnosis not present

## 2020-01-08 DIAGNOSIS — R092 Respiratory arrest: Secondary | ICD-10-CM

## 2020-01-08 DIAGNOSIS — R34 Anuria and oliguria: Secondary | ICD-10-CM | POA: Diagnosis not present

## 2020-01-08 DIAGNOSIS — I2609 Other pulmonary embolism with acute cor pulmonale: Secondary | ICD-10-CM | POA: Diagnosis not present

## 2020-01-08 DIAGNOSIS — R230 Cyanosis: Secondary | ICD-10-CM | POA: Diagnosis not present

## 2020-01-08 DIAGNOSIS — Y828 Other medical devices associated with adverse incidents: Secondary | ICD-10-CM | POA: Diagnosis not present

## 2020-01-08 DIAGNOSIS — G9340 Encephalopathy, unspecified: Secondary | ICD-10-CM | POA: Diagnosis not present

## 2020-01-08 DIAGNOSIS — Z930 Tracheostomy status: Secondary | ICD-10-CM

## 2020-01-08 DIAGNOSIS — I612 Nontraumatic intracerebral hemorrhage in hemisphere, unspecified: Secondary | ICD-10-CM | POA: Diagnosis not present

## 2020-01-08 DIAGNOSIS — D696 Thrombocytopenia, unspecified: Secondary | ICD-10-CM | POA: Diagnosis not present

## 2020-01-08 DIAGNOSIS — E1152 Type 2 diabetes mellitus with diabetic peripheral angiopathy with gangrene: Secondary | ICD-10-CM | POA: Diagnosis present

## 2020-01-08 DIAGNOSIS — B373 Candidiasis of vulva and vagina: Secondary | ICD-10-CM | POA: Diagnosis not present

## 2020-01-08 DIAGNOSIS — R5381 Other malaise: Secondary | ICD-10-CM | POA: Diagnosis not present

## 2020-01-08 DIAGNOSIS — G92 Toxic encephalopathy: Secondary | ICD-10-CM | POA: Diagnosis present

## 2020-01-08 DIAGNOSIS — Z7189 Other specified counseling: Secondary | ICD-10-CM

## 2020-01-08 DIAGNOSIS — E870 Hyperosmolality and hypernatremia: Secondary | ICD-10-CM | POA: Diagnosis not present

## 2020-01-08 DIAGNOSIS — R042 Hemoptysis: Secondary | ICD-10-CM | POA: Diagnosis not present

## 2020-01-08 DIAGNOSIS — I959 Hypotension, unspecified: Secondary | ICD-10-CM | POA: Diagnosis not present

## 2020-01-08 DIAGNOSIS — E139 Other specified diabetes mellitus without complications: Secondary | ICD-10-CM | POA: Diagnosis not present

## 2020-01-08 DIAGNOSIS — Z791 Long term (current) use of non-steroidal anti-inflammatories (NSAID): Secondary | ICD-10-CM

## 2020-01-08 DIAGNOSIS — U071 COVID-19: Secondary | ICD-10-CM

## 2020-01-08 DIAGNOSIS — E1165 Type 2 diabetes mellitus with hyperglycemia: Secondary | ICD-10-CM | POA: Diagnosis present

## 2020-01-08 DIAGNOSIS — D6959 Other secondary thrombocytopenia: Secondary | ICD-10-CM | POA: Diagnosis present

## 2020-01-08 DIAGNOSIS — J80 Acute respiratory distress syndrome: Secondary | ICD-10-CM

## 2020-01-08 DIAGNOSIS — Z4682 Encounter for fitting and adjustment of non-vascular catheter: Secondary | ICD-10-CM

## 2020-01-08 DIAGNOSIS — R7881 Bacteremia: Secondary | ICD-10-CM | POA: Diagnosis not present

## 2020-01-08 DIAGNOSIS — K760 Fatty (change of) liver, not elsewhere classified: Secondary | ICD-10-CM | POA: Diagnosis not present

## 2020-01-08 DIAGNOSIS — E875 Hyperkalemia: Secondary | ICD-10-CM | POA: Diagnosis not present

## 2020-01-08 DIAGNOSIS — Y95 Nosocomial condition: Secondary | ICD-10-CM | POA: Diagnosis not present

## 2020-01-08 DIAGNOSIS — G8194 Hemiplegia, unspecified affecting left nondominant side: Secondary | ICD-10-CM | POA: Diagnosis not present

## 2020-01-08 DIAGNOSIS — L89102 Pressure ulcer of unspecified part of back, stage 2: Secondary | ICD-10-CM | POA: Diagnosis not present

## 2020-01-08 DIAGNOSIS — I998 Other disorder of circulatory system: Secondary | ICD-10-CM | POA: Diagnosis not present

## 2020-01-08 DIAGNOSIS — J9601 Acute respiratory failure with hypoxia: Secondary | ICD-10-CM | POA: Diagnosis not present

## 2020-01-08 DIAGNOSIS — J9602 Acute respiratory failure with hypercapnia: Secondary | ICD-10-CM | POA: Diagnosis not present

## 2020-01-08 DIAGNOSIS — I611 Nontraumatic intracerebral hemorrhage in hemisphere, cortical: Secondary | ICD-10-CM | POA: Diagnosis not present

## 2020-01-08 DIAGNOSIS — E872 Acidosis: Secondary | ICD-10-CM | POA: Diagnosis not present

## 2020-01-08 DIAGNOSIS — E871 Hypo-osmolality and hyponatremia: Secondary | ICD-10-CM | POA: Diagnosis present

## 2020-01-08 DIAGNOSIS — J9809 Other diseases of bronchus, not elsewhere classified: Secondary | ICD-10-CM | POA: Diagnosis not present

## 2020-01-08 DIAGNOSIS — I509 Heart failure, unspecified: Secondary | ICD-10-CM | POA: Diagnosis present

## 2020-01-08 DIAGNOSIS — R0602 Shortness of breath: Secondary | ICD-10-CM

## 2020-01-08 DIAGNOSIS — G7281 Critical illness myopathy: Secondary | ICD-10-CM | POA: Diagnosis not present

## 2020-01-08 DIAGNOSIS — D689 Coagulation defect, unspecified: Secondary | ICD-10-CM | POA: Diagnosis not present

## 2020-01-08 DIAGNOSIS — G9341 Metabolic encephalopathy: Secondary | ICD-10-CM | POA: Diagnosis not present

## 2020-01-08 DIAGNOSIS — Z9281 Personal history of extracorporeal membrane oxygenation (ECMO): Secondary | ICD-10-CM | POA: Diagnosis not present

## 2020-01-08 DIAGNOSIS — Z6841 Body Mass Index (BMI) 40.0 and over, adult: Secondary | ICD-10-CM

## 2020-01-08 DIAGNOSIS — T8241XA Breakdown (mechanical) of vascular dialysis catheter, initial encounter: Secondary | ICD-10-CM | POA: Diagnosis not present

## 2020-01-08 DIAGNOSIS — Z882 Allergy status to sulfonamides status: Secondary | ICD-10-CM

## 2020-01-08 DIAGNOSIS — L8990 Pressure ulcer of unspecified site, unspecified stage: Secondary | ICD-10-CM | POA: Insufficient documentation

## 2020-01-08 DIAGNOSIS — R7989 Other specified abnormal findings of blood chemistry: Secondary | ICD-10-CM | POA: Diagnosis not present

## 2020-01-08 DIAGNOSIS — N170 Acute kidney failure with tubular necrosis: Secondary | ICD-10-CM | POA: Diagnosis not present

## 2020-01-08 DIAGNOSIS — J15211 Pneumonia due to Methicillin susceptible Staphylococcus aureus: Secondary | ICD-10-CM | POA: Diagnosis present

## 2020-01-08 DIAGNOSIS — R7401 Elevation of levels of liver transaminase levels: Secondary | ICD-10-CM | POA: Diagnosis not present

## 2020-01-08 DIAGNOSIS — Z79899 Other long term (current) drug therapy: Secondary | ICD-10-CM

## 2020-01-08 DIAGNOSIS — B9561 Methicillin susceptible Staphylococcus aureus infection as the cause of diseases classified elsewhere: Secondary | ICD-10-CM | POA: Diagnosis not present

## 2020-01-08 DIAGNOSIS — A4181 Sepsis due to Enterococcus: Secondary | ICD-10-CM | POA: Diagnosis present

## 2020-01-08 DIAGNOSIS — I619 Nontraumatic intracerebral hemorrhage, unspecified: Secondary | ICD-10-CM | POA: Insufficient documentation

## 2020-01-08 DIAGNOSIS — R680 Hypothermia, not associated with low environmental temperature: Secondary | ICD-10-CM | POA: Diagnosis not present

## 2020-01-08 DIAGNOSIS — J168 Pneumonia due to other specified infectious organisms: Secondary | ICD-10-CM | POA: Diagnosis not present

## 2020-01-08 DIAGNOSIS — Z4659 Encounter for fitting and adjustment of other gastrointestinal appliance and device: Secondary | ICD-10-CM

## 2020-01-08 DIAGNOSIS — R0902 Hypoxemia: Secondary | ICD-10-CM | POA: Diagnosis present

## 2020-01-08 DIAGNOSIS — D649 Anemia, unspecified: Secondary | ICD-10-CM | POA: Diagnosis not present

## 2020-01-08 DIAGNOSIS — Z981 Arthrodesis status: Secondary | ICD-10-CM

## 2020-01-08 DIAGNOSIS — Z87891 Personal history of nicotine dependence: Secondary | ICD-10-CM

## 2020-01-08 DIAGNOSIS — B965 Pseudomonas (aeruginosa) (mallei) (pseudomallei) as the cause of diseases classified elsewhere: Secondary | ICD-10-CM | POA: Diagnosis not present

## 2020-01-08 DIAGNOSIS — I359 Nonrheumatic aortic valve disorder, unspecified: Secondary | ICD-10-CM | POA: Diagnosis not present

## 2020-01-08 DIAGNOSIS — E119 Type 2 diabetes mellitus without complications: Secondary | ICD-10-CM | POA: Diagnosis not present

## 2020-01-08 DIAGNOSIS — G729 Myopathy, unspecified: Secondary | ICD-10-CM | POA: Diagnosis not present

## 2020-01-08 DIAGNOSIS — J9690 Respiratory failure, unspecified, unspecified whether with hypoxia or hypercapnia: Secondary | ICD-10-CM

## 2020-01-08 DIAGNOSIS — T17890A Other foreign object in other parts of respiratory tract causing asphyxiation, initial encounter: Secondary | ICD-10-CM | POA: Diagnosis not present

## 2020-01-08 DIAGNOSIS — T82594A Other mechanical complication of infusion catheter, initial encounter: Secondary | ICD-10-CM

## 2020-01-08 DIAGNOSIS — E873 Alkalosis: Secondary | ICD-10-CM | POA: Diagnosis not present

## 2020-01-08 DIAGNOSIS — T45515A Adverse effect of anticoagulants, initial encounter: Secondary | ICD-10-CM | POA: Diagnosis not present

## 2020-01-08 DIAGNOSIS — N179 Acute kidney failure, unspecified: Secondary | ICD-10-CM | POA: Diagnosis not present

## 2020-01-08 DIAGNOSIS — R1115 Cyclical vomiting syndrome unrelated to migraine: Secondary | ICD-10-CM

## 2020-01-08 DIAGNOSIS — A4189 Other specified sepsis: Secondary | ICD-10-CM | POA: Diagnosis present

## 2020-01-08 DIAGNOSIS — Z992 Dependence on renal dialysis: Secondary | ICD-10-CM

## 2020-01-08 DIAGNOSIS — F419 Anxiety disorder, unspecified: Secondary | ICD-10-CM | POA: Diagnosis present

## 2020-01-08 DIAGNOSIS — R739 Hyperglycemia, unspecified: Secondary | ICD-10-CM | POA: Diagnosis not present

## 2020-01-08 DIAGNOSIS — E876 Hypokalemia: Secondary | ICD-10-CM | POA: Diagnosis not present

## 2020-01-08 DIAGNOSIS — R1310 Dysphagia, unspecified: Secondary | ICD-10-CM

## 2020-01-08 DIAGNOSIS — R579 Shock, unspecified: Secondary | ICD-10-CM | POA: Diagnosis not present

## 2020-01-08 DIAGNOSIS — Z9911 Dependence on respirator [ventilator] status: Secondary | ICD-10-CM | POA: Diagnosis not present

## 2020-01-08 DIAGNOSIS — B952 Enterococcus as the cause of diseases classified elsewhere: Secondary | ICD-10-CM | POA: Diagnosis not present

## 2020-01-08 DIAGNOSIS — I10 Essential (primary) hypertension: Secondary | ICD-10-CM | POA: Diagnosis not present

## 2020-01-08 DIAGNOSIS — L89150 Pressure ulcer of sacral region, unstageable: Secondary | ICD-10-CM | POA: Diagnosis not present

## 2020-01-08 DIAGNOSIS — R6521 Severe sepsis with septic shock: Secondary | ICD-10-CM | POA: Diagnosis not present

## 2020-01-08 DIAGNOSIS — I616 Nontraumatic intracerebral hemorrhage, multiple localized: Secondary | ICD-10-CM | POA: Diagnosis not present

## 2020-01-08 DIAGNOSIS — G936 Cerebral edema: Secondary | ICD-10-CM | POA: Diagnosis not present

## 2020-01-08 DIAGNOSIS — G935 Compression of brain: Secondary | ICD-10-CM | POA: Diagnosis not present

## 2020-01-08 DIAGNOSIS — I480 Paroxysmal atrial fibrillation: Secondary | ICD-10-CM | POA: Diagnosis not present

## 2020-01-08 DIAGNOSIS — E1159 Type 2 diabetes mellitus with other circulatory complications: Secondary | ICD-10-CM | POA: Diagnosis not present

## 2020-01-08 DIAGNOSIS — J45909 Unspecified asthma, uncomplicated: Secondary | ICD-10-CM | POA: Diagnosis present

## 2020-01-08 DIAGNOSIS — N19 Unspecified kidney failure: Secondary | ICD-10-CM | POA: Diagnosis not present

## 2020-01-08 DIAGNOSIS — E785 Hyperlipidemia, unspecified: Secondary | ICD-10-CM | POA: Diagnosis present

## 2020-01-08 DIAGNOSIS — I110 Hypertensive heart disease with heart failure: Secondary | ICD-10-CM | POA: Diagnosis present

## 2020-01-08 DIAGNOSIS — R601 Generalized edema: Secondary | ICD-10-CM | POA: Diagnosis not present

## 2020-01-08 DIAGNOSIS — T82594S Other mechanical complication of infusion catheter, sequela: Secondary | ICD-10-CM

## 2020-01-08 DIAGNOSIS — Z978 Presence of other specified devices: Secondary | ICD-10-CM

## 2020-01-08 DIAGNOSIS — K5900 Constipation, unspecified: Secondary | ICD-10-CM | POA: Diagnosis not present

## 2020-01-08 DIAGNOSIS — D6489 Other specified anemias: Secondary | ICD-10-CM | POA: Diagnosis not present

## 2020-01-08 DIAGNOSIS — Z90710 Acquired absence of both cervix and uterus: Secondary | ICD-10-CM

## 2020-01-08 DIAGNOSIS — E669 Obesity, unspecified: Secondary | ICD-10-CM | POA: Diagnosis not present

## 2020-01-08 LAB — CBC
HCT: 45.9 % (ref 36.0–46.0)
Hemoglobin: 14.8 g/dL (ref 12.0–15.0)
MCH: 28.5 pg (ref 26.0–34.0)
MCHC: 32.2 g/dL (ref 30.0–36.0)
MCV: 88.3 fL (ref 80.0–100.0)
Platelets: 101 10*3/uL — ABNORMAL LOW (ref 150–400)
RBC: 5.2 MIL/uL — ABNORMAL HIGH (ref 3.87–5.11)
RDW: 13.2 % (ref 11.5–15.5)
WBC: 4.3 10*3/uL (ref 4.0–10.5)
nRBC: 0 % (ref 0.0–0.2)

## 2020-01-08 LAB — COMPREHENSIVE METABOLIC PANEL
ALT: 46 U/L — ABNORMAL HIGH (ref 0–44)
AST: 79 U/L — ABNORMAL HIGH (ref 15–41)
Albumin: 3.1 g/dL — ABNORMAL LOW (ref 3.5–5.0)
Alkaline Phosphatase: 114 U/L (ref 38–126)
Anion gap: 10 (ref 5–15)
BUN: 15 mg/dL (ref 6–20)
CO2: 27 mmol/L (ref 22–32)
Calcium: 8 mg/dL — ABNORMAL LOW (ref 8.9–10.3)
Chloride: 95 mmol/L — ABNORMAL LOW (ref 98–111)
Creatinine, Ser: 0.74 mg/dL (ref 0.44–1.00)
GFR calc Af Amer: >60 mL/min (ref >60)
GFR calc non Af Amer: >60 mL/min (ref >60)
Glucose, Bld: 357 mg/dL — ABNORMAL HIGH (ref 70–99)
Potassium: 4.1 mmol/L (ref 3.5–5.1)
Sodium: 132 mmol/L — ABNORMAL LOW (ref 135–145)
Total Bilirubin: 0.9 mg/dL (ref 0.3–1.2)
Total Protein: 6.4 g/dL — ABNORMAL LOW (ref 6.5–8.1)

## 2020-01-08 LAB — CBC WITH DIFFERENTIAL/PLATELET
Abs Immature Granulocytes: 0.04 10*3/uL (ref 0.00–0.07)
Basophils Absolute: 0 10*3/uL (ref 0.0–0.1)
Basophils Relative: 0 %
Eosinophils Absolute: 0 10*3/uL (ref 0.0–0.5)
Eosinophils Relative: 0 %
HCT: 44.5 % (ref 36.0–46.0)
Hemoglobin: 14.7 g/dL (ref 12.0–15.0)
Immature Granulocytes: 1 %
Lymphocytes Relative: 11 %
Lymphs Abs: 0.4 10*3/uL — ABNORMAL LOW (ref 0.7–4.0)
MCH: 28.6 pg (ref 26.0–34.0)
MCHC: 33 g/dL (ref 30.0–36.0)
MCV: 86.6 fL (ref 80.0–100.0)
Monocytes Absolute: 0.1 10*3/uL (ref 0.1–1.0)
Monocytes Relative: 2 %
Neutro Abs: 3.6 10*3/uL (ref 1.7–7.7)
Neutrophils Relative %: 86 %
Platelets: 99 10*3/uL — ABNORMAL LOW (ref 150–400)
RBC: 5.14 MIL/uL — ABNORMAL HIGH (ref 3.87–5.11)
RDW: 13.2 % (ref 11.5–15.5)
WBC: 4.2 10*3/uL (ref 4.0–10.5)
nRBC: 0 % (ref 0.0–0.2)

## 2020-01-08 LAB — GLUCOSE, CAPILLARY: Glucose-Capillary: 285 mg/dL — ABNORMAL HIGH (ref 70–99)

## 2020-01-08 LAB — TROPONIN I (HIGH SENSITIVITY): Troponin I (High Sensitivity): 24 ng/L — ABNORMAL HIGH (ref <18)

## 2020-01-08 LAB — TRIGLYCERIDES: Triglycerides: 167 mg/dL — ABNORMAL HIGH (ref <150)

## 2020-01-08 LAB — CREATININE, SERUM
Creatinine, Ser: 0.7 mg/dL (ref 0.44–1.00)
GFR calc Af Amer: >60 mL/min (ref >60)
GFR calc non Af Amer: >60 mL/min (ref >60)

## 2020-01-08 LAB — LACTATE DEHYDROGENASE: LDH: 518 U/L — ABNORMAL HIGH (ref 98–192)

## 2020-01-08 LAB — CBG MONITORING, ED: Glucose-Capillary: 361 mg/dL — ABNORMAL HIGH (ref 70–99)

## 2020-01-08 LAB — C-REACTIVE PROTEIN: CRP: 13.2 mg/dL — ABNORMAL HIGH (ref <1.0)

## 2020-01-08 LAB — ABO/RH: ABO/RH(D): O POS

## 2020-01-08 LAB — SARS CORONAVIRUS 2 BY RT PCR (HOSPITAL ORDER, PERFORMED IN ~~LOC~~ HOSPITAL LAB): SARS Coronavirus 2: POSITIVE — AB

## 2020-01-08 LAB — PROCALCITONIN: Procalcitonin: 0.6 ng/mL

## 2020-01-08 LAB — FERRITIN: Ferritin: 1012 ng/mL — ABNORMAL HIGH (ref 11–307)

## 2020-01-08 LAB — LACTIC ACID, PLASMA
Lactic Acid, Venous: 2.6 mmol/L (ref 0.5–1.9)
Lactic Acid, Venous: 2.1 mmol/L (ref 0.5–1.9)

## 2020-01-08 LAB — D-DIMER, QUANTITATIVE: D-Dimer, Quant: 1.32 ug/mL-FEU — ABNORMAL HIGH (ref 0.00–0.50)

## 2020-01-08 LAB — FIBRINOGEN: Fibrinogen: 620 mg/dL — ABNORMAL HIGH (ref 210–475)

## 2020-01-08 MED ORDER — GUAIFENESIN-DM 100-10 MG/5ML PO SYRP: 10.0000 mL | ORAL_SOLUTION | ORAL | Status: DC | PRN

## 2020-01-08 MED ORDER — HYDROCOD POLST-CPM POLST ER 10-8 MG/5ML PO SUER: 5.0000 mL | Freq: Two times a day (BID) | ORAL | Status: DC | PRN

## 2020-01-08 MED ORDER — ENOXAPARIN SODIUM 60 MG/0.6ML ~~LOC~~ SOLN
60.0000 mg | Freq: Every day | SUBCUTANEOUS | Status: DC
  Administered 2020-01-08 – 2020-01-11 (×4): 60 mg via SUBCUTANEOUS
  Filled 2020-01-08 (×5): qty 0.6

## 2020-01-08 MED ORDER — INSULIN ASPART 100 UNIT/ML ~~LOC~~ SOLN
0.0000 [IU] | SUBCUTANEOUS | Status: DC
  Administered 2020-01-08: 15 [IU] via SUBCUTANEOUS
  Administered 2020-01-08: 8 [IU] via SUBCUTANEOUS
  Filled 2020-01-08: qty 1

## 2020-01-08 MED ORDER — ACETAMINOPHEN 500 MG PO TABS: 1000.0000 mg | ORAL_TABLET | Freq: Four times a day (QID) | ORAL | Status: DC | PRN

## 2020-01-08 MED ORDER — ZINC SULFATE 220 (50 ZN) MG PO CAPS
220.0000 mg | ORAL_CAPSULE | Freq: Every day | ORAL | Status: DC
  Administered 2020-01-09 – 2020-01-15 (×7): 220 mg via ORAL
  Filled 2020-01-08 (×8): qty 1

## 2020-01-08 MED ORDER — INSULIN ASPART 100 UNIT/ML ~~LOC~~ SOLN
15.0000 [IU] | Freq: Three times a day (TID) | SUBCUTANEOUS | Status: DC
  Administered 2020-01-09: 15 [IU] via SUBCUTANEOUS

## 2020-01-08 MED ORDER — TOCILIZUMAB 400 MG/20ML IV SOLN
800.0000 mg | Freq: Once | INTRAVENOUS | Status: AC
  Administered 2020-01-08: 800 mg via INTRAVENOUS
  Filled 2020-01-08: qty 40

## 2020-01-08 MED ORDER — IBUPROFEN 800 MG PO TABS
800.0000 mg | ORAL_TABLET | Freq: Once | ORAL | Status: AC
  Administered 2020-01-08: 800 mg via ORAL
  Filled 2020-01-08: qty 1

## 2020-01-08 MED ORDER — ONDANSETRON HCL 4 MG PO TABS
4.0000 mg | ORAL_TABLET | Freq: Four times a day (QID) | ORAL | Status: DC | PRN
  Administered 2020-02-25: 4 mg via ORAL
  Filled 2020-01-08: qty 1

## 2020-01-08 MED ORDER — INSULIN GLARGINE 100 UNIT/ML ~~LOC~~ SOLN
80.0000 [IU] | Freq: Every day | SUBCUTANEOUS | Status: DC
  Administered 2020-01-08: 80 [IU] via SUBCUTANEOUS
  Filled 2020-01-08: qty 0.8

## 2020-01-08 MED ORDER — ASCORBIC ACID 500 MG PO TABS
500.0000 mg | ORAL_TABLET | Freq: Every day | ORAL | Status: DC
  Administered 2020-01-09 – 2020-01-15 (×7): 500 mg via ORAL
  Filled 2020-01-08 (×7): qty 1

## 2020-01-08 MED ORDER — ONDANSETRON HCL 4 MG/2ML IJ SOLN
4.0000 mg | Freq: Four times a day (QID) | INTRAMUSCULAR | Status: DC | PRN
  Administered 2020-01-09 – 2020-03-03 (×19): 4 mg via INTRAVENOUS
  Filled 2020-01-08 (×19): qty 2

## 2020-01-08 MED ORDER — METHYLPREDNISOLONE SODIUM SUCC 125 MG IJ SOLR
0.5000 mg/kg | Freq: Two times a day (BID) | INTRAMUSCULAR | Status: DC
  Administered 2020-01-09 – 2020-01-11 (×5): 61.25 mg via INTRAVENOUS
  Filled 2020-01-08 (×5): qty 2

## 2020-01-08 MED ORDER — SODIUM CHLORIDE 0.9 % IV SOLN
100.0000 mg | INTRAVENOUS | Status: AC
  Administered 2020-01-08 (×2): 100 mg via INTRAVENOUS
  Filled 2020-01-08: qty 20

## 2020-01-08 MED ORDER — INSULIN ASPART 100 UNIT/ML ~~LOC~~ SOLN
0.0000 [IU] | Freq: Three times a day (TID) | SUBCUTANEOUS | Status: DC
  Administered 2020-01-09 (×2): 8 [IU] via SUBCUTANEOUS

## 2020-01-08 MED ORDER — SODIUM CHLORIDE 0.9 % IV SOLN
INTRAVENOUS | Status: DC | PRN
  Administered 2020-01-08: 500 mL via INTRAVENOUS
  Administered 2020-01-20 – 2020-01-29 (×3): 250 mL via INTRAVENOUS
  Administered 2020-02-01: 500 mL via INTRAVENOUS
  Administered 2020-02-09: 250 mL via INTRAVENOUS
  Administered 2020-02-11: 500 mL via INTRAVENOUS
  Administered 2020-03-01: 250 mL via INTRAVENOUS

## 2020-01-08 MED ORDER — DEXAMETHASONE SODIUM PHOSPHATE 10 MG/ML IJ SOLN: 6.0000 mg | INTRAMUSCULAR | Status: DC

## 2020-01-08 MED ORDER — TOCILIZUMAB 400 MG/20ML IV SOLN
800.0000 mg | Freq: Once | INTRAVENOUS | Status: DC
  Filled 2020-01-08: qty 40

## 2020-01-08 MED ORDER — SODIUM CHLORIDE 0.9 % IV SOLN
100.0000 mg | Freq: Every day | INTRAVENOUS | Status: AC
  Administered 2020-01-09 – 2020-01-12 (×4): 100 mg via INTRAVENOUS
  Filled 2020-01-08 (×5): qty 20

## 2020-01-08 MED ORDER — DEXAMETHASONE SODIUM PHOSPHATE 10 MG/ML IJ SOLN
10.0000 mg | Freq: Once | INTRAMUSCULAR | Status: AC
  Administered 2020-01-08: 10 mg via INTRAVENOUS
  Filled 2020-01-08: qty 1

## 2020-01-08 MED ORDER — ACETAMINOPHEN 325 MG PO TABS
650.0000 mg | ORAL_TABLET | Freq: Four times a day (QID) | ORAL | Status: DC | PRN
  Administered 2020-01-08: 650 mg via ORAL
  Filled 2020-01-08: qty 2

## 2020-01-08 NOTE — Progress Notes (Signed)
°   01/20/2020 1903  Assess: MEWS Score  Temp 99.1 F (37.3 C)  BP (!) 155/71  Pulse Rate 95  Resp (!) 30  SpO2 90 %  O2 Device HFNC  O2 Flow Rate (L/min) 15 L/min  Assess: MEWS Score  MEWS Temp 0  MEWS Systolic 0  MEWS Pulse 0  MEWS RR 2  MEWS LOC 0  MEWS Score 2  MEWS Score Color Yellow  Assess: if the MEWS score is Yellow or Red  Were vital signs taken at a resting state? Yes  Focused Assessment No change from prior assessment  Early Detection of Sepsis Score *See Row Information* Low  MEWS guidelines implemented *See Row Information* Yes  Treat  MEWS Interventions Other (Comment) (continue supportive care)  Pain Scale 0-10  Pain Score 0  Take Vital Signs  Increase Vital Sign Frequency  Yellow: Q 2hr X 2 then Q 4hr X 2, if remains yellow, continue Q 4hrs  Escalate  MEWS: Escalate Yellow: discuss with charge nurse/RN and consider discussing with provider and RRT  Notify: Charge Nurse/RN  Name of Charge Nurse/RN Notified Lauren Hopkins  Date Charge Nurse/RN Notified 01/28/2020  Time Charge Nurse/RN Notified 1900   Patient admitted to unit as Yellow MEWS.  Discussed with Ander Purpura, RN.  Patient COVID + and has increased RR.  Will continue to monitor.  Currently on 15L HFNC after transferring to bed.

## 2020-01-08 NOTE — ED Notes (Signed)
Pt. Coughed a small amount of bloody sputum, after using the bedside commode.

## 2020-01-08 NOTE — ED Notes (Signed)
12L NFNC

## 2020-01-08 NOTE — ED Provider Notes (Signed)
[facility] Provider Note   CSN: [id number] Arrival date & time: 01/16/2020  0940     History Chief Complaint  Patient presents with  . Shortness of Breath  . Covid +    [facility] is a 54 y.o. female.  HPI      54yo female with history of DM, nonalcoholic fatty liver disease, asthma, recent diagnosis of COVID one week ago presents with concern for shortness of breath. Reports dyspnea really began yesterday, is moderate-severe. Has had cough, fatigue, decreased appetite.  Has also had diarrhea, congestion, nausea. She had been seen in the ED for severe headache previously, had MRI. Reports headache improved with ibuprofen at home.  Feels significant fatigue and dyspnea at this time.  Past Medical History:  Diagnosis Date  . Asthma   . Diabetes mellitus without complication ([place])   . Diverticulitis   . Gallstones   . IBS (irritable bowel syndrome)   . NAFLD (nonalcoholic fatty liver disease)    CT scan 2015  . Ovarian cyst     Patient Active Problem List   Diagnosis Date Noted  . Pneumonia due to COVID-19 virus 01/07/2020  . Diabetes mellitus type 1.5, managed as type 1 ([place]) 10/12/2018  . NAFLD (nonalcoholic fatty liver disease)   . Seborrheic keratoses 03/23/2018  . Allergic rhinitis 11/14/2014  . Asthma in adult 11/14/2014  . IBS (irritable bowel syndrome) 11/14/2014    Past Surgical History:  Procedure Laterality Date  . ABDOMINAL HYSTERECTOMY    . CESAREAN SECTION    . CHOLECYSTECTOMY    . COLONOSCOPY     about 2011     OB History   No obstetric history on file.     Family History  Problem Relation Age of Onset  . Diabetes Father   . Heart disease Father   . Breast cancer Maternal Grandmother   . Breast cancer Maternal Aunt   . Colon cancer Neg Hx   . Esophageal cancer Neg Hx     Social History   Tobacco Use  . Smoking status: Former Research scientist (life sciences)  . Smokeless tobacco: Never Used  . Tobacco comment: quit over 10  years ago  Vaping Use  . Vaping Use: Never used  Substance Use Topics  . Alcohol use: No    Comment: rarely  . Drug use: No    Home Medications Prior to Admission medications   Medication Sig Start Date End Date Taking? Authorizing Provider  albuterol (PROVENTIL HFA;VENTOLIN HFA) 108 (90 Base) MCG/ACT inhaler Inhale 2 puffs into the lungs every 6 (six) hours as needed for wheezing or shortness of breath. 03/23/18   Shelda Pal, DO  azithromycin (ZITHROMAX) 250 MG tablet Take 2 tabs the first day and then 1 tab daily until you run out. 01/02/20   Nani Ravens, Crosby Oyster, DO  Blood Glucose Monitoring Suppl (ONETOUCH VERIO) w/Device KIT Test as directed once weekly to check blood sugar. 04/25/18   Shelda Pal, DO  dicyclomine (BENTYL) 10 MG capsule Take 1 capsule (10 mg total) by mouth 4 (four) times daily -  before meals and at bedtime. 09/13/19   Shelda Pal, DO  fluticasone (FLONASE) 50 MCG/ACT nasal spray PLACE 2 SPRAYS INTO BOTH NOSTRILS DAILY. 08/24/18   Saguier, Percell Miller, PA-C  glucose blood (ONETOUCH VERIO) test strip USE STRIP TO CHECK GLUCOSE THREE TIMES DAILY 08/31/18   Shelda Pal, DO  ibuprofen (ADVIL) 600 MG tablet Take 1 tablet (600 mg total) by mouth  every 8 (eight) hours as needed. 01/06/20   Charlesetta Shanks, MD  insulin lispro (HUMALOG) 100 UNIT/ML KwikPen 15 units with each meal + 8 g/1 unit sliding scale. 06/13/19   Shelda Pal, DO  meloxicam (MOBIC) 7.5 MG tablet Take 1 tablet (7.5 mg total) by mouth daily. 12/11/19   Tasia Catchings, Amy V, PA-C  ONE TOUCH LANCETS MISC Use once a week to check blood sugar. 04/25/18   Shelda Pal, DO  tiZANidine (ZANAFLEX) 2 MG tablet Take 1 tablet (2 mg total) by mouth every 8 (eight) hours as needed for muscle spasms. 12/11/19   Yu, Amy V, PA-C  TOUJEO MAX SOLOSTAR 300 UNIT/ML Solostar Pen INJECT 62 UNITS INTO THE SKIN 2 (TWO) TIMES DAILY. 11/24/19   Shelda Pal, DO    Allergies     Sulfa antibiotics  Review of Systems   Review of Systems  Constitutional: Positive for activity change, appetite change and fatigue. Negative for fever.  HENT: Positive for congestion. Negative for sore throat.   Eyes: Negative for visual disturbance.  Respiratory: Positive for cough and shortness of breath.   Cardiovascular: Negative for chest pain.  Gastrointestinal: Positive for diarrhea and nausea. Negative for abdominal pain and vomiting.  Genitourinary: Negative for difficulty urinating.  Musculoskeletal: Negative for back pain and neck pain.  Skin: Negative for rash.  Neurological: Positive for headaches. Negative for syncope.    Physical Exam Updated Vital Signs BP (!) 134/51 Comment: 12L HFNC  Pulse (!) 101 Comment: 12L HFNC  Temp 98.7 F (37.1 C) (Oral)   Resp (!) 34 Comment: 12L HFNC  Ht 5' 4"  (1.626 m)   Wt (!) 122.5 kg   SpO2 92% Comment: 12L HFNC  BMI 46.34 kg/m   Physical Exam Vitals and nursing note reviewed.  Constitutional:      General: She is not in acute distress.    Appearance: She is well-developed. She is not diaphoretic.  HENT:     Head: Normocephalic and atraumatic.  Eyes:     Conjunctiva/sclera: Conjunctivae normal.  Cardiovascular:     Rate and Rhythm: Normal rate and regular rhythm.     Heart sounds: Normal heart sounds. No murmur heard.  No friction rub. No gallop.   Pulmonary:     Effort: Pulmonary effort is normal. No respiratory distress.     Breath sounds: Normal breath sounds. No wheezing or rales.  Abdominal:     General: There is no distension.     Palpations: Abdomen is soft.     Tenderness: There is no abdominal tenderness. There is no guarding.  Musculoskeletal:        General: No tenderness.     Cervical back: Normal range of motion.  Skin:    General: Skin is warm and dry.     Findings: No erythema or rash.  Neurological:     Mental Status: She is alert and oriented to person, place, and time.     ED Results /  Procedures / Treatments   Labs (all labs ordered are listed, but only abnormal results are displayed) Labs Reviewed  SARS CORONAVIRUS 2 BY RT PCR (HOSPITAL ORDER, [facility]) - Abnormal; Notable for the following components:      Result Value   SARS Coronavirus 2 POSITIVE (*)    All other components within normal limits  LACTIC ACID, PLASMA - Abnormal; Notable for the following components:   Lactic Acid, Venous 2.6 (*)    All other components  within normal limits  LACTIC ACID, PLASMA - Abnormal; Notable for the following components:   Lactic Acid, Venous 2.1 (*)    All other components within normal limits  CBC WITH DIFFERENTIAL/PLATELET - Abnormal; Notable for the following components:   RBC 5.14 (*)    Platelets 99 (*)    Lymphs Abs 0.4 (*)    All other components within normal limits  COMPREHENSIVE METABOLIC PANEL - Abnormal; Notable for the following components:   Sodium 132 (*)    Chloride 95 (*)    Glucose, Bld 357 (*)    Calcium 8.0 (*)    Total Protein 6.4 (*)    Albumin 3.1 (*)    AST 79 (*)    ALT 46 (*)    All other components within normal limits  D-DIMER, QUANTITATIVE (NOT AT [facility]) - Abnormal; Notable for the following components:   D-Dimer, Quant 1.32 (*)    All other components within normal limits  LACTATE DEHYDROGENASE - Abnormal; Notable for the following components:   LDH 518 (*)    All other components within normal limits  FERRITIN - Abnormal; Notable for the following components:   Ferritin 1,012 (*)    All other components within normal limits  TRIGLYCERIDES - Abnormal; Notable for the following components:   Triglycerides 167 (*)    All other components within normal limits  FIBRINOGEN - Abnormal; Notable for the following components:   Fibrinogen 620 (*)    All other components within normal limits  C-REACTIVE PROTEIN - Abnormal; Notable for the following components:   CRP 13.2 (*)    All other components within normal  limits  CULTURE, BLOOD (ROUTINE X 2)  CULTURE, BLOOD (ROUTINE X 2)  PROCALCITONIN    EKG EKG Interpretation  Date/Time:  Monday January 08 2020 10:05:06 EDT Ventricular Rate:  99 PR Interval:    QRS Duration: 71 QT Interval:  449 QTC Calculation: 577 R Axis:   163 Text Interpretation: Sinus rhythm S1,S2,S3 pattern Low voltage, precordial leads Borderline repolarization abnormality Prolonged QT interval No significant change since last tracing Confirmed by Gareth Morgan [phone]) on 02/02/2020 11:07:49 AM   Radiology MR ANGIO HEAD WO CONTRAST  Result Date: 01/06/2020 CLINICAL DATA:  Subarachnoid hemorrhage suspected. Additional provided: Subarachnoid hemorrhage suspected, COVID positive with headaches for 9 days. EXAM: MRI HEAD WITHOUT CONTRAST MRA HEAD WITHOUT CONTRAST TECHNIQUE: Multiplanar, multiecho pulse sequences of the brain and surrounding structures were obtained without intravenous contrast. Angiographic images of the head were obtained using MRA technique without contrast. COMPARISON:  Head CT 01/06/2020 FINDINGS: MRI HEAD FINDINGS Brain: Cerebral volume is normal. Minimal scattered T2/FLAIR hyperintensity within the cerebral white matter is nonspecific, but consistent with chronic small vessel ischemic disease. There is no acute infarct. No evidence of intracranial mass. No chronic intracranial blood products. No extra-axial fluid collection. No midline shift. Vascular: Reported below. Skull and upper cervical spine: No focal marrow lesion. Sinuses/Orbits: Visualized orbits show no acute finding. Mild ethmoid sinus mucosal thickening. Small right maxillary sinus mucous retention cyst. No significant mastoid effusion MRA HEAD FINDINGS The intracranial internal carotid arteries are patent. The M1 middle cerebral arteries are patent without significant stenosis. No M2 proximal branch occlusion or high-grade proximal stenosis is identified. Developmentally absent A1 right anterior cerebral  artery. The A1 left ACA and more distal anterior cerebral arteries are patent. There is a slightly bulbous appearance of the anterior communicating artery measuring approximately 1 mm (series 103, image 6). This may reflect a tiny aneurysm.  The non dominant intracranial left vertebral artery is developmentally diminutive, but patent. The dominant cranial right vertebral artery is patent without significant stenosis. The basilar artery is developmentally diminutive, but patent without significant stenosis. The posterior cerebral arteries are patent proximally without significant stenosis. The posterior cerebral arteries are predominantly fetal in origin bilaterally. IMPRESSION: MRI brain: 1. No evidence of acute intracranial abnormality. 2. Mild chronic small vessel ischemic changes within the cerebral white matter MRA head: 1. No intracranial large vessel occlusion or proximal high-grade arterial stenosis. 2. 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 3. There is a slightly bulbous appearance of the anterior communicating artery measuring 1 mm in diameter. This may reflect a tiny aneurysm. 4. The intracranial vertebral and basilar arteries are developmentally diminutive in the setting of predominantly fetal origin posterior cerebral arteries. Electronically Signed   By: Kellie Simmering DO   On: 01/06/2020 19:14   MR Brain Wo Contrast (neuro protocol)  Result Date: 01/06/2020 CLINICAL DATA:  Subarachnoid hemorrhage suspected. Additional provided: Subarachnoid hemorrhage suspected, COVID positive with headaches for 9 days. EXAM: MRI HEAD WITHOUT CONTRAST MRA HEAD WITHOUT CONTRAST TECHNIQUE: Multiplanar, multiecho pulse sequences of the brain and surrounding structures were obtained without intravenous contrast. Angiographic images of the head were obtained using MRA technique without contrast. COMPARISON:  Head CT 01/06/2020 FINDINGS: MRI HEAD FINDINGS Brain: Cerebral volume is normal. Minimal  scattered T2/FLAIR hyperintensity within the cerebral white matter is nonspecific, but consistent with chronic small vessel ischemic disease. There is no acute infarct. No evidence of intracranial mass. No chronic intracranial blood products. No extra-axial fluid collection. No midline shift. Vascular: Reported below. Skull and upper cervical spine: No focal marrow lesion. Sinuses/Orbits: Visualized orbits show no acute finding. Mild ethmoid sinus mucosal thickening. Small right maxillary sinus mucous retention cyst. No significant mastoid effusion MRA HEAD FINDINGS The intracranial internal carotid arteries are patent. The M1 middle cerebral arteries are patent without significant stenosis. No M2 proximal branch occlusion or high-grade proximal stenosis is identified. 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 The A1 left ACA and more distal anterior cerebral arteries are patent. There is a slightly bulbous appearance of the anterior communicating artery measuring approximately 1 mm (series 103, image 6). This may reflect a tiny aneurysm. The non dominant intracranial left vertebral artery is developmentally diminutive, but patent. The dominant cranial right vertebral artery is patent without significant stenosis. The basilar artery is developmentally diminutive, but patent without significant stenosis. The posterior cerebral arteries are patent proximally without significant stenosis. The posterior cerebral arteries are predominantly fetal in origin bilaterally. IMPRESSION: MRI brain: 1. No evidence of acute intracranial abnormality. 2. Mild chronic small vessel ischemic changes within the cerebral white matter MRA head: 1. No intracranial large vessel occlusion or proximal high-grade arterial stenosis. 2. 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 3. There is a slightly bulbous appearance of the anterior communicating artery measuring 1 mm in diameter. This may reflect a tiny  aneurysm. 4. The intracranial vertebral and basilar arteries are developmentally diminutive in the setting of predominantly fetal origin posterior cerebral arteries. Electronically Signed   By: Kellie Simmering DO   On: 01/06/2020 19:14   DG Chest Port 1 View  Result Date: 01/07/2020 CLINICAL DATA:  Shortness of breath.  COVID-19 positive EXAM: PORTABLE CHEST 1 VIEW COMPARISON:  January 06, 2020 FINDINGS: There is ill-defined patchy airspace disease in the left mid lung and lung base regions. No consolidation. Heart size and pulmonary vascularity are normal. No adenopathy. There  is postoperative change in the lower cervical region. IMPRESSION: Areas of somewhat ill-defined patchy airspace disease, likely due to atypical organism pneumonia. Heart size and pulmonary vascular normal. No adenopathy. Electronically Signed   By: Lowella Grip III M.D.   On: 01/10/2020 11:09   DG Chest Port 1 View  Result Date: 01/06/2020 CLINICAL DATA:  COVID-19 positivity with headaches and chest pain, initial encounter EXAM: PORTABLE CHEST 1 VIEW COMPARISON:  06/28/2004 FINDINGS: Cardiac shadow is within normal limits. The lungs are well aerated bilaterally. Minimal bibasilar atelectatic changes are seen. No bony abnormality is noted. IMPRESSION: Mild bibasilar atelectasis. Electronically Signed   By: Inez Catalina M.D.   On: 01/06/2020 19:20    Procedures .Critical Care Performed by: Gareth Morgan, MD Authorized by: Gareth Morgan, MD   Critical care provider statement:    Critical care time (minutes):  45   Critical care was necessary to treat or prevent imminent or life-threatening deterioration of the following conditions:  Respiratory failure   Critical care was time spent personally by me on the following activities:  Discussions with consultants, evaluation of patient's response to treatment, examination of patient, ordering and performing treatments and interventions, ordering and review of laboratory studies,  ordering and review of radiographic studies, pulse oximetry, re-evaluation of patient's condition, obtaining history from patient or surrogate and review of old charts   (including critical care time)  Medications Ordered in ED Medications  remdesivir 100 mg in sodium chloride 0.9 % 100 mL IVPB (has no administration in time range)  0.9 %  sodium chloride infusion (500 mLs Intravenous New Bag/Given 01/19/2020 1134)  dexamethasone (DECADRON) injection 6 mg (has no administration in time range)  tocilizumab (ACTEMRA) 800 mg in sodium chloride 0.9 % 100 mL infusion (has no administration in time range)  insulin aspart (novoLOG) injection 0-15 Units (has no administration in time range)  acetaminophen (TYLENOL) tablet 650 mg (has no administration in time range)  dexamethasone (DECADRON) injection 10 mg (10 mg Intravenous Given 01/07/2020 1122)  remdesivir 100 mg in sodium chloride 0.9 % 100 mL IVPB ( Intravenous Stopped 01/09/2020 1342)    ED Course  I have reviewed the triage vital signs and the nursing notes.  Pertinent labs & imaging results that were available during my care of the patient were reviewed by me and considered in my medical decision making (see chart for details).    MDM Rules/Calculators/A&P                         54yo female with history of DM, nonalcoholic fatty liver disease, asthma, recent diagnosis of COVID one week ago presents with concern for shortness of breath.  She presents hypoxic to 72% on arrival, improved to 86% on 6L,.  Placed on HFNC and titrated to 12L/min with saturation around 91%.  Presentation consistent with respiratory failure with hypoxia secondary to COVID 19 infection. DDImer not significantly elevated, do feel secondary to COVID and after discussion with admitting team we agree to forego CTA testing at this time.  Labs show elevated lactic acid likely secondary to hypoxia, hyperglycemia, chronic mild LFT elevation.  XR consistent with atypical pneumonia.    Ordered decadron, remdesivir.  Discussed with Dr. Lupita Leash hospitalist and reviewed indications for Actemra. Given hospital is full discussed possibility of initiating medication here.  Reviewed her procalcitonin of .6 with Dr. Lake Bells in setting of hypoxia and feels that if low clinical suspicion for other bacterial infection this is not  contraindication for use. Discussed with patient and daughter that this is off label use and risk of worsening bacterial infections as well as risk of liver abnormalities. They have agreed to treatment, and it has been ordered, however it appears we cannot obtain the medication. Will continue decadron and remdesivir as well as HFNC.  Signed out to oncoming provider as she awaits progressive care hospitalist admission.    Final Clinical Impression(s) / ED Diagnoses Final diagnoses:  COVID-19  Hypoxia    Rx / DC Orders ED Discharge Orders    None       Gareth Morgan, MD 01/07/2020 [phone]

## 2020-01-08 NOTE — H&P (Signed)
History and Physical    Alisha Stephenson:[id number] DOB: [birth year] DOA: 01/24/2020  PCP: Shelda Pal, DO  Patient coming from: Home.  Chief Complaint: Shortness of breath.  HPI: Alisha Stephenson is a 54 y.o. female with history of diabetes mellitus type 2, obesity presents to the ER at [facility] with complaints of shortness of breath.  Patient started having diarrhea about a week ago at that time patient was diagnosed with COVID-19.  Subsequent to which patient started having some headache and had come to the ER on January 06, 2020 had MRI/MRI of the brain which did not show anything acute.  Patient was discharged home.  Over the last 24 hours patient started having short of breath with some nonproductive cough with no chest pain.  Diarrhea occasionally.  Patient presents to the ER at [facility].  ED Course: Patient required high flow oxygen 2 L to maintain sats.  Chest x-ray shows infiltrates concerning for pneumonia.  Patient had a fever of 99.1.  Labs are significant for platelets of 99 CRP of 13.2 lactic acid 2.6 D-dimer 1.32.  ER physician discussed with on-call pulmonologist and patient was started on Actemra along with steroids and remdesivir.  Patient admitted for acute respiratory failure with hypoxia secondary to COVID-19 infection.  Patient has not had Covid vaccine.  Blood sugar was around 357 and patient had not taken her long-acting insulin this morning which usually takes in the morning.  Review of Systems: As per HPI, rest all negative.   Past Medical History:  Diagnosis Date  . Asthma   . Diabetes mellitus without complication ([place])   . Diverticulitis   . Gallstones   . IBS (irritable bowel syndrome)   . NAFLD (nonalcoholic fatty liver disease)    CT scan 2015  . Ovarian cyst     Past Surgical History:  Procedure Laterality Date  . ABDOMINAL HYSTERECTOMY    . CESAREAN SECTION    . CHOLECYSTECTOMY    . COLONOSCOPY     about 2011     reports  that she has quit smoking. She has never used smokeless tobacco. She reports that she does not drink alcohol and does not use drugs.  Allergies  Allergen Reactions  . Sulfa Antibiotics Rash and Other (See Comments)    Family History  Problem Relation Age of Onset  . Diabetes Father   . Heart disease Father   . Breast cancer Maternal Grandmother   . Breast cancer Maternal Aunt   . Colon cancer Neg Hx   . Esophageal cancer Neg Hx     Prior to Admission medications   Medication Sig Start Date End Date Taking? Authorizing Provider  albuterol (PROVENTIL HFA;VENTOLIN HFA) 108 (90 Base) MCG/ACT inhaler Inhale 2 puffs into the lungs every 6 (six) hours as needed for wheezing or shortness of breath. 03/23/18   Shelda Pal, DO  azithromycin (ZITHROMAX) 250 MG tablet Take 2 tabs the first day and then 1 tab daily until you run out. 01/02/20   Nani Ravens, Crosby Oyster, DO  Blood Glucose Monitoring Suppl (ONETOUCH VERIO) w/Device KIT Test as directed once weekly to check blood sugar. 04/25/18   Shelda Pal, DO  dicyclomine (BENTYL) 10 MG capsule Take 1 capsule (10 mg total) by mouth 4 (four) times daily -  before meals and at bedtime. 09/13/19   Shelda Pal, DO  fluticasone (FLONASE) 50 MCG/ACT nasal spray PLACE 2 SPRAYS INTO BOTH NOSTRILS DAILY. 08/24/18  Saguier, Percell Miller, PA-C  glucose blood (ONETOUCH VERIO) test strip USE STRIP TO CHECK GLUCOSE THREE TIMES DAILY 08/31/18   Shelda Pal, DO  ibuprofen (ADVIL) 600 MG tablet Take 1 tablet (600 mg total) by mouth every 8 (eight) hours as needed. 01/06/20   Charlesetta Shanks, MD  insulin lispro (HUMALOG) 100 UNIT/ML KwikPen 15 units with each meal + 8 g/1 unit sliding scale. 06/13/19   Shelda Pal, DO  meloxicam (MOBIC) 7.5 MG tablet Take 1 tablet (7.5 mg total) by mouth daily. 12/11/19   Tasia Catchings, Amy V, PA-C  ONE TOUCH LANCETS MISC Use once a week to check blood sugar. 04/25/18   Shelda Pal, DO   tiZANidine (ZANAFLEX) 2 MG tablet Take 1 tablet (2 mg total) by mouth every 8 (eight) hours as needed for muscle spasms. 12/11/19   Yu, Amy V, PA-C  TOUJEO MAX SOLOSTAR 300 UNIT/ML Solostar Pen INJECT 62 UNITS INTO THE SKIN 2 (TWO) TIMES DAILY. 11/24/19   Shelda Pal, DO    Physical Exam: Constitutional: Moderately built and nourished. Vitals:   02/01/2020 1500 02/05/2020 1600 02/06/2020 1700 01/26/2020 1903  BP: (!) 134/51 (!) 157/72 (!) 150/68 (!) 155/71  Pulse: (!) 101 98 95 95  Resp: (!) 34 (!) 36 (!) 33 (!) 30  Temp:    99.1 F (37.3 C)  TempSrc:    Oral  SpO2: 92% 90% 92% 90%  Weight:      Height:       Eyes: Anicteric no pallor. ENMT: No discharge from the ears eyes nose or mouth. Neck: No mass felt.  No neck rigidity. Respiratory: No rhonchi or crepitations. Cardiovascular: S1-S2 heard. Abdomen: Soft nontender bowel sounds present. Musculoskeletal: No edema.  No joint effusion. Skin: No rash. Neurologic: Alert awake oriented to time place and person.  Moves all extremities. Psychiatric: Appears normal per normal affect.   Labs on Admission: I have personally reviewed following labs and imaging studies  CBC: Recent Labs  Lab 01/06/20 1429 01/26/2020 1025  WBC 2.5* 4.2  NEUTROABS 1.6* 3.6  HGB 15.0 14.7  HCT 45.7 44.5  MCV 86.7 86.6  PLT 88* 99*   Basic Metabolic Panel: Recent Labs  Lab 01/06/20 1429 01/09/2020 1025  NA 134* 132*  K 3.4* 4.1  CL 96* 95*  CO2 26 27  GLUCOSE 304* 357*  BUN 13 15  CREATININE 0.67 0.74  CALCIUM 8.2* 8.0*   GFR: Estimated Creatinine Clearance: 102.6 mL/min (by C-G formula based on SCr of 0.74 mg/dL). Liver Function Tests: Recent Labs  Lab 01/06/20 1429 01/11/2020 1025  AST 56* 79*  ALT 49* 46*  ALKPHOS 104 114  BILITOT 0.5 0.9  PROT 7.1 6.4*  ALBUMIN 3.4* 3.1*   No results for input(s): LIPASE, AMYLASE in the last 168 hours. No results for input(s): AMMONIA in the last 168 hours. Coagulation Profile: Recent  Labs  Lab 01/06/20 1749  INR 1.0   Cardiac Enzymes: No results for input(s): CKTOTAL, CKMB, CKMBINDEX, TROPONINI in the last 168 hours. BNP (last 3 results) No results for input(s): PROBNP in the last 8760 hours. HbA1C: No results for input(s): HGBA1C in the last 72 hours. CBG: Recent Labs  Lab 01/06/20 1924 01/07/2020 1608 01/14/2020 2044  GLUCAP 293* 361* 285*   Lipid Profile: Recent Labs    01/14/2020 1025  TRIG 167*   Thyroid Function Tests: No results for input(s): TSH, T4TOTAL, FREET4, T3FREE, THYROIDAB in the last 72 hours. Anemia Panel: Recent Labs  01/30/2020 1025  FERRITIN 1,012*   Urine analysis:    Component Value Date/Time   COLORURINE YELLOW 07/05/2017 0908   APPEARANCEUR CLEAR 07/05/2017 0908   LABSPEC 1.025 07/05/2017 0908   PHURINE 5.5 07/05/2017 0908   GLUCOSEU NEGATIVE 07/05/2017 0908   HGBUR NEGATIVE 07/05/2017 0908   BILIRUBINUR Negative 07/29/2017 [place] 07/05/2017 0908   PROTEINUR Negative 07/29/2017 0938   PROTEINUR NEGATIVE 04/28/2012 0822   UROBILINOGEN negative (A) 07/29/2017 0938   UROBILINOGEN 0.2 07/05/2017 0908   NITRITE Negative 07/29/2017 0938   NITRITE NEGATIVE 07/05/2017 0908   LEUKOCYTESUR Negative 07/29/2017 0938   Sepsis Labs: @LABRCNTIP (procalcitonin:4,lacticidven:4) ) Recent Results (from the past 240 hour(s))  SARS Coronavirus 2 by RT PCR (hospital order, performed in [facility] hospital lab) Nasopharyngeal Nasopharyngeal Swab     Status: Abnormal   Collection Time: 01/19/2020 11:19 AM   Specimen: Nasopharyngeal Swab  Result Value Ref Range Status   SARS Coronavirus 2 POSITIVE (A) NEGATIVE Final    Comment: RESULT CALLED TO, READ BACK BY AND VERIFIED WITH:  SIMMS MARVA, RN @ 2440 ON 01/21/2020, [email or web address].P (NOTE) SARS-CoV-2 target nucleic acids are DETECTED  SARS-CoV-2 RNA is generally detectable in upper respiratory specimens  during the acute phase of infection.  Positive results are indicative   of the presence of the identified virus, but do not rule out bacterial infection or co-infection with other pathogens not detected by the test.  Clinical correlation with patient history and  other diagnostic information is necessary to determine patient infection status.  The expected result is negative.  Fact Sheet for Patients:   StrictlyIdeas.no   Fact Sheet for Healthcare Providers:   BankingDealers.co.za    This test is not yet approved or cleared by the [place] FDA and  has been authorized for detection and/or diagnosis of SARS-CoV-2 by FDA under an Emergency Use Authorization (EUA).  This EUA will remain in effect  (meaning this test can be used) for the duration of  the COVID-19 declaration under Section 564(b)(1) of the Act, 21 U.S.C. section 360-bbb-3(b)(1), unless the authorization is terminated or revoked sooner.  Performed at [facility], [address] [place], [place] [address]      Radiological Exams on Admission: DG Chest [facility] 1 View  Result Date: 01/21/2020 CLINICAL DATA:  Shortness of breath.  COVID-19 positive EXAM: PORTABLE CHEST 1 VIEW COMPARISON:  January 06, 2020 FINDINGS: There is ill-defined patchy airspace disease in the left mid lung and lung base regions. No consolidation. Heart size and pulmonary vascularity are normal. No adenopathy. There is postoperative change in the lower cervical region. IMPRESSION: Areas of somewhat ill-defined patchy airspace disease, likely due to atypical organism pneumonia. Heart size and pulmonary vascular normal. No adenopathy. Electronically Signed   By: Lowella Grip III M.D.   On: 01/24/2020 11:09    EKG: Independently reviewed.  Normal sinus rhythm nonspecific ST-T changes.  Assessment/Plan Principal Problem:   Acute respiratory failure due to COVID-19 [facility]) Active Problems:   NAFLD (nonalcoholic fatty liver disease)   Diabetes mellitus type 1.5,  managed as type 1 ([place])   Pneumonia due to COVID-19 virus    1. Acute respiratory failure with hypoxia secondary to COVID-19 pneumonia for which patient has been started on steroids remdesivir and since patient has significant hypoxia and elevated inflammatory markers ER physician I discussed with on-call pulmonologist and also with patient about the risk and benefits of starting Actemra patient agreed and patient is receiving  Actemra.  Closely follow respiratory status and inflammatory markers. 2. Diabetes mellitus type 2 with hyperglycemia -patient usually takes Toujeo 80 units in the morning along with premeal 15 units Humalog 3 times daily.  I have ordered them right now and also moderate dose sliding scale.  Closely monitor CBGs since patient is on steroids. 3. Thrombocytopenia likely from Covid infection follow CBC closely. 4. Hyponatremia could be from hyperglycemia follow metabolic panel closely.  Since patient is acute respiratory failure with Covid infection will need inpatient status.   DVT prophylaxis: Lovenox.  Of the patient has thrombocytopenia. Code Status: Full code. Family Communication: Discussed with patient. Disposition Plan: Home. Consults called: ER physician discussed with pulmonologist. Admission status: Inpatient.   Rise Patience MD [facility] Pager [phone].  If 7PM-7AM, please contact night-coverage [email or web address] Password [facility]  01/07/2020, 9:41 PM

## 2020-01-08 NOTE — ED Notes (Signed)
Per pharmacist at [facility] ED, Actemra cannot be sent to this facility by courier.

## 2020-01-08 NOTE — ED Triage Notes (Signed)
Covid positive this Monday.  SOB started yesterday.

## 2020-01-08 NOTE — ED Notes (Signed)
12 L HFNC

## 2020-01-09 ENCOUNTER — Inpatient Hospital Stay (HOSPITAL_COMMUNITY): Payer: PRIVATE HEALTH INSURANCE

## 2020-01-09 ENCOUNTER — Inpatient Hospital Stay: Payer: Self-pay

## 2020-01-09 ENCOUNTER — Telehealth: Payer: Self-pay | Admitting: Family Medicine

## 2020-01-09 DIAGNOSIS — K760 Fatty (change of) liver, not elsewhere classified: Secondary | ICD-10-CM

## 2020-01-09 DIAGNOSIS — J1282 Pneumonia due to Coronavirus disease 2019: Secondary | ICD-10-CM | POA: Diagnosis not present

## 2020-01-09 DIAGNOSIS — U071 COVID-19: Secondary | ICD-10-CM

## 2020-01-09 DIAGNOSIS — E139 Other specified diabetes mellitus without complications: Secondary | ICD-10-CM

## 2020-01-09 DIAGNOSIS — J9600 Acute respiratory failure, unspecified whether with hypoxia or hypercapnia: Secondary | ICD-10-CM

## 2020-01-09 LAB — CBC WITH DIFFERENTIAL/PLATELET
Abs Immature Granulocytes: 0.01 10*3/uL (ref 0.00–0.07)
Basophils Absolute: 0 10*3/uL (ref 0.0–0.1)
Basophils Relative: 0 %
Eosinophils Absolute: 0 10*3/uL (ref 0.0–0.5)
Eosinophils Relative: 0 %
HCT: 46.2 % — ABNORMAL HIGH (ref 36.0–46.0)
Hemoglobin: 15.1 g/dL — ABNORMAL HIGH (ref 12.0–15.0)
Immature Granulocytes: 0 %
Lymphocytes Relative: 14 %
Lymphs Abs: 0.6 10*3/uL — ABNORMAL LOW (ref 0.7–4.0)
MCH: 28.9 pg (ref 26.0–34.0)
MCHC: 32.7 g/dL (ref 30.0–36.0)
MCV: 88.5 fL (ref 80.0–100.0)
Monocytes Absolute: 0.1 10*3/uL (ref 0.1–1.0)
Monocytes Relative: 3 %
Neutro Abs: 3.5 10*3/uL (ref 1.7–7.7)
Neutrophils Relative %: 83 %
Platelets: 110 10*3/uL — ABNORMAL LOW (ref 150–400)
RBC: 5.22 MIL/uL — ABNORMAL HIGH (ref 3.87–5.11)
RDW: 13.3 % (ref 11.5–15.5)
WBC: 4.3 10*3/uL (ref 4.0–10.5)
nRBC: 0 % (ref 0.0–0.2)

## 2020-01-09 LAB — BLOOD GAS, ARTERIAL
Acid-Base Excess: 1.3 mmol/L (ref 0.0–2.0)
Bicarbonate: 28.4 mmol/L — ABNORMAL HIGH (ref 20.0–28.0)
O2 Saturation: 78.1 %
Patient temperature: 98.6
pCO2 arterial: 57.7 mmHg — ABNORMAL HIGH (ref 32.0–48.0)
pH, Arterial: 7.314 — ABNORMAL LOW (ref 7.350–7.450)
pO2, Arterial: 49.7 mmHg — ABNORMAL LOW (ref 83.0–108.0)

## 2020-01-09 LAB — COMPREHENSIVE METABOLIC PANEL
ALT: 43 U/L (ref 0–44)
AST: 72 U/L — ABNORMAL HIGH (ref 15–41)
Albumin: 3.1 g/dL — ABNORMAL LOW (ref 3.5–5.0)
Alkaline Phosphatase: 115 U/L (ref 38–126)
Anion gap: 9 (ref 5–15)
BUN: 17 mg/dL (ref 6–20)
CO2: 29 mmol/L (ref 22–32)
Calcium: 8 mg/dL — ABNORMAL LOW (ref 8.9–10.3)
Chloride: 99 mmol/L (ref 98–111)
Creatinine, Ser: 0.74 mg/dL (ref 0.44–1.00)
GFR calc Af Amer: >60 mL/min (ref >60)
GFR calc non Af Amer: >60 mL/min (ref >60)
Glucose, Bld: 319 mg/dL — ABNORMAL HIGH (ref 70–99)
Potassium: 3.8 mmol/L (ref 3.5–5.1)
Sodium: 137 mmol/L (ref 135–145)
Total Bilirubin: 1 mg/dL (ref 0.3–1.2)
Total Protein: 6.5 g/dL (ref 6.5–8.1)

## 2020-01-09 LAB — GLUCOSE, CAPILLARY
Glucose-Capillary: 294 mg/dL — ABNORMAL HIGH (ref 70–99)
Glucose-Capillary: 294 mg/dL — ABNORMAL HIGH (ref 70–99)
Glucose-Capillary: 296 mg/dL — ABNORMAL HIGH (ref 70–99)
Glucose-Capillary: 304 mg/dL — ABNORMAL HIGH (ref 70–99)
Glucose-Capillary: 360 mg/dL — ABNORMAL HIGH (ref 70–99)
Glucose-Capillary: 350 mg/dL — ABNORMAL HIGH (ref 70–99)

## 2020-01-09 LAB — MAGNESIUM: Magnesium: 2.5 mg/dL — ABNORMAL HIGH (ref 1.7–2.4)

## 2020-01-09 LAB — D-DIMER, QUANTITATIVE: D-Dimer, Quant: 1.29 ug/mL-FEU — ABNORMAL HIGH (ref 0.00–0.50)

## 2020-01-09 LAB — PHOSPHORUS: Phosphorus: 2.6 mg/dL (ref 2.5–4.6)

## 2020-01-09 LAB — C-REACTIVE PROTEIN: CRP: 18.7 mg/dL — ABNORMAL HIGH (ref <1.0)

## 2020-01-09 LAB — HIV ANTIBODY (ROUTINE TESTING W REFLEX): HIV Screen 4th Generation wRfx: NONREACTIVE

## 2020-01-09 LAB — PROCALCITONIN: Procalcitonin: 1.23 ng/mL

## 2020-01-09 LAB — TROPONIN I (HIGH SENSITIVITY): Troponin I (High Sensitivity): 23 ng/L — ABNORMAL HIGH (ref <18)

## 2020-01-09 MED ORDER — ALPRAZOLAM 0.5 MG PO TABS: 0.5000 mg | ORAL_TABLET | Freq: Once | ORAL | Status: DC

## 2020-01-09 MED ORDER — ETOMIDATE 2 MG/ML IV SOLN
INTRAVENOUS | Status: AC
  Administered 2020-01-09: 2 mg
  Filled 2020-01-09: qty 20

## 2020-01-09 MED ORDER — FENTANYL CITRATE (PF) 100 MCG/2ML IJ SOLN: 50.0000 ug | Freq: Once | INTRAMUSCULAR | Status: DC

## 2020-01-09 MED ORDER — METOPROLOL TARTRATE 5 MG/5ML IV SOLN
5.0000 mg | INTRAVENOUS | Status: AC | PRN
  Administered 2020-01-09 (×2): 5 mg via INTRAVENOUS
  Filled 2020-01-09 (×2): qty 5

## 2020-01-09 MED ORDER — VECURONIUM BROMIDE 10 MG IV SOLR
INTRAVENOUS | Status: AC
  Administered 2020-01-09: 10 mg
  Filled 2020-01-09: qty 10

## 2020-01-09 MED ORDER — INSULIN ASPART 100 UNIT/ML ~~LOC~~ SOLN
0.0000 [IU] | SUBCUTANEOUS | Status: DC
  Administered 2020-01-09: 15 [IU] via SUBCUTANEOUS
  Administered 2020-01-09: 11 [IU] via SUBCUTANEOUS
  Administered 2020-01-10 (×2): 15 [IU] via SUBCUTANEOUS
  Administered 2020-01-10: 11 [IU] via SUBCUTANEOUS

## 2020-01-09 MED ORDER — LORAZEPAM 2 MG/ML IJ SOLN
0.5000 mg | INTRAMUSCULAR | Status: DC | PRN
  Administered 2020-01-09: 1 mg via INTRAVENOUS
  Filled 2020-01-09: qty 1

## 2020-01-09 MED ORDER — CHLORHEXIDINE GLUCONATE CLOTH 2 % EX PADS
6.0000 | MEDICATED_PAD | Freq: Every day | CUTANEOUS | Status: DC
  Administered 2020-01-09 – 2020-02-11 (×35): 6 via TOPICAL

## 2020-01-09 MED ORDER — FUROSEMIDE 10 MG/ML IJ SOLN
40.0000 mg | Freq: Four times a day (QID) | INTRAMUSCULAR | Status: AC
  Administered 2020-01-09 (×2): 40 mg via INTRAVENOUS
  Filled 2020-01-09: qty 4

## 2020-01-09 MED ORDER — IPRATROPIUM-ALBUTEROL 20-100 MCG/ACT IN AERS
2.0000 | INHALATION_SPRAY | Freq: Four times a day (QID) | RESPIRATORY_TRACT | Status: DC
  Filled 2020-01-09: qty 4

## 2020-01-09 MED ORDER — HYDROXYZINE HCL 25 MG PO TABS
25.0000 mg | ORAL_TABLET | Freq: Three times a day (TID) | ORAL | Status: DC | PRN
  Administered 2020-01-09: 25 mg via ORAL
  Filled 2020-01-09: qty 1

## 2020-01-09 MED ORDER — LINAGLIPTIN 5 MG PO TABS
5.0000 mg | ORAL_TABLET | Freq: Every day | ORAL | Status: DC
  Administered 2020-01-09 – 2020-01-15 (×7): 5 mg via ORAL
  Filled 2020-01-09 (×7): qty 1

## 2020-01-09 MED ORDER — STERILE WATER FOR INJECTION IJ SOLN
INTRAMUSCULAR | Status: AC
  Filled 2020-01-09: qty 10

## 2020-01-09 MED ORDER — CHLORHEXIDINE GLUCONATE 0.12% ORAL RINSE (MEDLINE KIT)
15.0000 mL | Freq: Two times a day (BID) | OROMUCOSAL | Status: DC
  Administered 2020-01-09 – 2020-03-04 (×105): 15 mL via OROMUCOSAL

## 2020-01-09 MED ORDER — PROPOFOL 1000 MG/100ML IV EMUL
0.0000 ug/kg/min | INTRAVENOUS | Status: DC
  Administered 2020-01-09: 35 ug/kg/min via INTRAVENOUS
  Administered 2020-01-09: 5 ug/kg/min via INTRAVENOUS
  Administered 2020-01-09: 35 ug/kg/min via INTRAVENOUS
  Administered 2020-01-10: 30 ug/kg/min via INTRAVENOUS
  Administered 2020-01-10: 35 ug/kg/min via INTRAVENOUS
  Filled 2020-01-09 (×6): qty 100

## 2020-01-09 MED ORDER — SODIUM CHLORIDE 0.9% FLUSH: 10.0000 mL | INTRAVENOUS | Status: DC | PRN

## 2020-01-09 MED ORDER — ORAL CARE MOUTH RINSE
15.0000 mL | OROMUCOSAL | Status: DC
  Administered 2020-01-09 – 2020-03-04 (×501): 15 mL via OROMUCOSAL

## 2020-01-09 MED ORDER — MIDAZOLAM HCL 2 MG/2ML IJ SOLN
2.0000 mg | INTRAMUSCULAR | Status: DC | PRN
  Administered 2020-01-10: 2 mg via INTRAVENOUS
  Filled 2020-01-09: qty 2

## 2020-01-09 MED ORDER — MIDAZOLAM HCL 2 MG/2ML IJ SOLN
INTRAMUSCULAR | Status: AC
  Filled 2020-01-09: qty 4

## 2020-01-09 MED ORDER — IPRATROPIUM-ALBUTEROL 0.5-2.5 (3) MG/3ML IN SOLN
3.0000 mL | Freq: Four times a day (QID) | RESPIRATORY_TRACT | Status: DC
  Administered 2020-01-09 – 2020-01-15 (×24): 3 mL via RESPIRATORY_TRACT
  Filled 2020-01-09 (×23): qty 3

## 2020-01-09 MED ORDER — VECURONIUM BROMIDE 10 MG IV SOLR: 10.0000 mg | Freq: Once | INTRAVENOUS | Status: AC

## 2020-01-09 MED ORDER — FUROSEMIDE 10 MG/ML IJ SOLN
INTRAMUSCULAR | Status: AC
  Filled 2020-01-09: qty 4

## 2020-01-09 MED ORDER — INSULIN ASPART 100 UNIT/ML ~~LOC~~ SOLN: 0.0000 [IU] | SUBCUTANEOUS | Status: DC

## 2020-01-09 MED ORDER — FREE WATER
200.0000 mL | Status: DC
  Administered 2020-01-09 – 2020-01-15 (×37): 200 mL

## 2020-01-09 MED ORDER — FENTANYL BOLUS VIA INFUSION
50.0000 ug | INTRAVENOUS | Status: DC | PRN
  Administered 2020-01-10: 50 ug via INTRAVENOUS
  Filled 2020-01-09: qty 50

## 2020-01-09 MED ORDER — SUCCINYLCHOLINE CHLORIDE 200 MG/10ML IV SOSY
PREFILLED_SYRINGE | INTRAVENOUS | Status: AC
  Filled 2020-01-09: qty 10

## 2020-01-09 MED ORDER — INSULIN GLARGINE 100 UNIT/ML ~~LOC~~ SOLN
50.0000 [IU] | Freq: Two times a day (BID) | SUBCUTANEOUS | Status: DC
  Administered 2020-01-09: 50 [IU] via SUBCUTANEOUS
  Filled 2020-01-09 (×2): qty 0.5

## 2020-01-09 MED ORDER — LIDOCAINE HCL (CARDIAC) PF 100 MG/5ML IV SOSY
PREFILLED_SYRINGE | INTRAVENOUS | Status: AC
  Filled 2020-01-09: qty 5

## 2020-01-09 MED ORDER — SODIUM CHLORIDE 0.9% FLUSH
10.0000 mL | Freq: Two times a day (BID) | INTRAVENOUS | Status: DC
  Administered 2020-01-09: 30 mL
  Administered 2020-01-10 – 2020-01-14 (×9): 10 mL
  Administered 2020-01-15: 30 mL
  Administered 2020-01-15 – 2020-01-17 (×4): 10 mL
  Administered 2020-01-17: 20 mL
  Administered 2020-01-18 – 2020-01-21 (×8): 10 mL
  Administered 2020-01-22: 30 mL
  Administered 2020-01-22: 10 mL
  Administered 2020-01-23: 20 mL
  Administered 2020-01-23 – 2020-01-24 (×3): 10 mL
  Administered 2020-01-25 – 2020-01-26 (×2): 20 mL
  Administered 2020-01-26 – 2020-01-28 (×5): 10 mL

## 2020-01-09 MED ORDER — FENTANYL CITRATE (PF) 100 MCG/2ML IJ SOLN
INTRAMUSCULAR | Status: AC
  Administered 2020-01-09: 50 ug
  Filled 2020-01-09: qty 2

## 2020-01-09 MED ORDER — DOCUSATE SODIUM 50 MG/5ML PO LIQD
100.0000 mg | Freq: Two times a day (BID) | ORAL | Status: DC
  Administered 2020-01-09: 100 mg via ORAL
  Filled 2020-01-09: qty 10

## 2020-01-09 MED ORDER — SODIUM CHLORIDE 0.9 % IV SOLN
500.0000 mg | INTRAVENOUS | Status: DC
  Administered 2020-01-09 – 2020-01-11 (×3): 500 mg via INTRAVENOUS
  Filled 2020-01-09 (×4): qty 500

## 2020-01-09 MED ORDER — SODIUM CHLORIDE 0.9 % IV SOLN
2.0000 g | INTRAVENOUS | Status: DC
  Administered 2020-01-09 – 2020-01-11 (×3): 2 g via INTRAVENOUS
  Filled 2020-01-09 (×4): qty 20

## 2020-01-09 MED ORDER — ALBUTEROL SULFATE (2.5 MG/3ML) 0.083% IN NEBU: 2.5000 mg | INHALATION_SOLUTION | RESPIRATORY_TRACT | Status: DC | PRN

## 2020-01-09 MED ORDER — PROSOURCE TF PO LIQD
45.0000 mL | Freq: Two times a day (BID) | ORAL | Status: DC
  Administered 2020-01-09 – 2020-01-10 (×2): 45 mL
  Filled 2020-01-09 (×2): qty 45

## 2020-01-09 MED ORDER — VITAL HIGH PROTEIN PO LIQD
1000.0000 mL | ORAL | Status: DC
  Administered 2020-01-09: 1000 mL

## 2020-01-09 MED ORDER — POLYETHYLENE GLYCOL 3350 17 G PO PACK
17.0000 g | PACK | Freq: Every day | ORAL | Status: DC
  Filled 2020-01-09: qty 1

## 2020-01-09 MED ORDER — FENTANYL 2500MCG IN NS 250ML (10MCG/ML) PREMIX INFUSION
50.0000 ug/h | INTRAVENOUS | Status: DC
  Administered 2020-01-09: 50 ug/h via INTRAVENOUS
  Administered 2020-01-10: 200 ug/h via INTRAVENOUS
  Filled 2020-01-09 (×2): qty 250

## 2020-01-09 MED ORDER — ROCURONIUM BROMIDE 10 MG/ML (PF) SYRINGE
PREFILLED_SYRINGE | INTRAVENOUS | Status: AC
  Filled 2020-01-09: qty 10

## 2020-01-09 MED ORDER — MIDAZOLAM HCL 2 MG/2ML IJ SOLN
2.0000 mg | INTRAMUSCULAR | Status: AC | PRN
  Administered 2020-01-09 – 2020-01-10 (×3): 2 mg via INTRAVENOUS
  Filled 2020-01-09 (×2): qty 2

## 2020-01-09 MED ORDER — ORAL CARE MOUTH RINSE
15.0000 mL | Freq: Two times a day (BID) | OROMUCOSAL | Status: DC
  Administered 2020-01-09: 15 mL via OROMUCOSAL

## 2020-01-09 MED ORDER — INSULIN ASPART 100 UNIT/ML ~~LOC~~ SOLN
15.0000 [IU] | SUBCUTANEOUS | Status: DC
  Administered 2020-01-09 – 2020-01-10 (×4): 15 [IU] via SUBCUTANEOUS

## 2020-01-09 MED ORDER — VECURONIUM BROMIDE 10 MG IV SOLR
INTRAVENOUS | Status: AC
  Filled 2020-01-09: qty 10

## 2020-01-09 NOTE — Progress Notes (Signed)
Peripherally Inserted Central Catheter Placement  The IV Nurse has discussed with the patient and/or persons authorized to consent for the patient, the purpose of this procedure and the potential benefits and risks involved with this procedure.  The benefits include less needle sticks, lab draws from the catheter, and the patient may be discharged home with the catheter. Risks include, but not limited to, infection, bleeding, blood clot (thrombus formation), and puncture of an artery; nerve damage and irregular heartbeat and possibility to perform a PICC exchange if needed/ordered by physician.  Alternatives to this procedure were also discussed.  Bard Power PICC patient education guide, fact sheet on infection prevention and patient information card has been provided to patient /or left at bedside.    Consent obtained via telephone with husband  PICC Placement Documentation  PICC Triple Lumen 88/67/73 PICC Right Basilic 41 cm 0 cm (Active)  Indication for Insertion or Continuance of Line Prolonged intravenous therapies 01/09/20 1800  Exposed Catheter (cm) 0 cm 01/09/20 1800  Site Assessment Clean;Dry;Intact 01/09/20 1800  Lumen #1 Status Flushed;Saline locked;Blood return noted 01/09/20 1800  Lumen #2 Status Flushed;Saline locked;Blood return noted 01/09/20 1800  Lumen #3 Status Flushed;Saline locked;Blood return noted 01/09/20 1800  Dressing Type Transparent;Securing device 01/09/20 1800  Dressing Status Clean;Dry;Intact;Antimicrobial disc in place 01/09/20 1800  Dressing Change Due 01/16/20 01/09/20 1800       Darlyn Read 01/09/2020, 6:13 PM

## 2020-01-09 NOTE — Progress Notes (Addendum)
PROGRESS NOTE    Alisha Stephenson  ZOX:[id number] DOB: June 02, 1966 DOA: 01/11/2020 PCP: Shelda Pal, DO   Brief Narrative:  HPI per Dr. Gean Birchwood on 01/30/2020 Alisha Stephenson is a 54 y.o. female with history of diabetes mellitus type 2, obesity presents to the ER at [facility] with complaints of shortness of breath.  Patient started having diarrhea about a week ago at that time patient was diagnosed with COVID-19.  Subsequent to which patient started having some headache and had come to the ER on January 06, 2020 had MRI/MRI of the brain which did not show anything acute.  Patient was discharged home.  Over the last 24 hours patient started having short of breath with some nonproductive cough with no chest pain.  Diarrhea occasionally.  Patient presents to the ER at [facility].  ED Course: Patient required high flow oxygen 2 L to maintain sats.  Chest x-ray shows infiltrates concerning for pneumonia.  Patient had a fever of 99.1.  Labs are significant for platelets of 99 CRP of 13.2 lactic acid 2.6 D-dimer 1.32.  ER physician discussed with on-call pulmonologist and patient was started on Actemra along with steroids and remdesivir.  Patient admitted for acute respiratory failure with hypoxia secondary to COVID-19 infection.  Patient has not had Covid vaccine.  Blood sugar was around 357 and patient had not taken her long-acting insulin this morning which usually takes in the morning.  **Interim History  Respiratory status continued to worsen she continues to desaturate even on 15 L high flow nasal cannula along with a nonrebreather so she will weight-bear on heated high flow transfer to the stepdown unit.  She is now on 40 L with 100% FiO2 and pulmonary is consulted for further evaluation recommendations.  Assessment & Plan:   Principal Problem:   Acute respiratory failure due to COVID-19 [facility]) Active Problems:   NAFLD (nonalcoholic fatty liver disease)   Diabetes mellitus  type 1.5, managed as type 1 ([place])   Pneumonia due to COVID-19 virus  Severe Viral Sepsis, poA Acute Respiratory Failure with Hypoxia 2/2 COVID-19 Pneumonia  -SARS-CoV-2 PCR positive Last week -Patient met criteria for severe sepsis on admission given SIRS criteria of a respiratory rate > 20, HR >90, and a Temperature of 100.4  With evidence of organ dysfunction given Acute Respiratory Failure with Hypoxia -Initial CXR showed "Areas of somewhat ill-defined patchy airspace disease, likely due to atypical organism pneumonia. Heart size and pulmonary vascular normal. No adenopathy." -Repeat CXR 01/09/20 done and showed "Widespread airspace opacity bilaterally, increased from 1 day prior. Suspect multifocal pneumonia, likely of atypical organism etiology, particularly given the clinical history. Note that a degree of pulmonary edema and/or ARDS superimposed cannot be excluded Radiographically. Stable cardiac silhouette." -Sepsis Physiology is improved but Respiratory Status continues to Worsen -Inflammatory Marker Trend: Recent Labs    01/18/2020 1025 01/09/20 0036  DDIMER 1.32* 1.29*  FERRITIN 1,012*  --   LDH 518*  --   CRP 13.2* 18.7*  -Troponin was flat and went from 24 -> 23  Lab Results  Component Value Date   SARSCOV2NAA POSITIVE (A) 01/25/2020  -Fibrinogen was 620 -PCT was 0.60 and worsened to 1.23 so Empirc Abx added by Pulmnonary -SpO2: (!) 86 % O2 Flow Rate (L/min): 40 L/min FiO2 (%): 100 % -Continue Remdesivir x5 days 01/08/20 -> ; Husband does not want it to be given but patient wants it still  -Steroids x10 days; Currently getting IV Solu-Medrol 61.25  mg every 12 for 10 days -Received Tocilizumab Off Label EUA 800 mg x1 -CRP was grossly elevated at 13.2 -> 18.7 -Continue Airborne, contact precautions for 21 days from positive testing (since patient is admitted for COVID) -Contine Monitor inflammatory markers and LFTs.  -Encourage OOB, IS, FV -C/w Acetaminophen 650 mg po  q6prn for Fever  -Enoxaparin 71m q24h -Blood Cx x2 pending -Added Combivent 2 puff IH q6h and Albuterol 1 puff IH q4hprn -Continue to monitor temperature curve and continue with acetaminophen for mild fever -Antitussives with p.o. Robitussin-DM and Tussionex; Will add Benzonatate 100 mg po TIDprn as well  -Continue with zinc sulfate 220 mg p.o. daily along with vitamin C 500 mg p.o. daily -Prone if able -Continue monitor respiratory status carefully and  wean O2 as tolerated  -Has a high risk for decompensation -Pulmonary evaluating and they recommend IV Lasix 40 mg every 6 for 2 doses given her increased work of breathing and tachypnea and recommending repeating a stat x-ray -Given IV lorazepam for her anxiety and has also started hydroxyzine 25 mg p.o. 3 times daily as needed -Repeat chest x-ray in the a.m. -Patient is a former Smoker -Will need an ambulatory home O2 screen prior to discharge and will order one today   Lymphopenia -Due to covid, Having a Count <700 is generally associated with a poor prognostic sign,  -Lymphocyte Count improving and went from 0.4 -> 0.6 -Continue to Monitor and Trend and repeat CBC in the AM   Diabetes mellitus type 2 with hyperglycemia -Patient usually takes Toujeo 80 units in the morning along with premeal 15 units Humalog 3 times daily. -Resume Home Insulin and also moderate dose sliding scale.   -Closely monitor CBGs since patient is on steroids. -We will add Tradjenta 5 mg p.o. daily and increase Lantus to 50 units twice daily from the Lantus 80 units subcu nightly -Check HbA1c int the AM  -CBG's ranging from 285-361 with a blood sugar of 319 this morning on CMP  Thrombocytopenia  -likely from Covid infection  -Improving as patient's Platelet Count went from 99 -> 101 -> 110 -Today to monitor for signs and symptoms of bleeding; currently no overt bleeding noted -Repeat CBC in a.m.  Hyponatremia -In the setting of Hyperglycemia but her blood  sugars remain significantly elevated still -Improving as patient's sodium is gone from 132 is now 137 -Continue monitor and trend and repeat CMP in a.m.  Abnormal LFT's/LFT elevation, improving  -Due to COVID viral infection and  -Patient's AST went from 79 -> 72 and patient's ALT went from 46 -> 43 -Also has a Hx of NAFLD -Will need to be continue to monitor daily and repeat CMP in a.m.  Morbid Obesity -Estimated body mass index is 46.34 kg/m as calculated from the following:   Height as of this encounter: _0  (1.626 m).   Weight as of this encounter: 122.5 kg. -Weight Loss and Dietary Counseling given   Gastroenteritis 2/2 COVID 19 Disease -Had Diarrhea about a week ago and was diagnosed with COVID then -Continues to have intermittent diarrhea    DVT prophylaxis: Enoxaparin 40 mg sq q24h Code Status: FULL CODE  Family Communication: No family present at bedside  Disposition Plan: Transfer to the SDU for further monitoring and evaluation; Now requiring HHFNC 40L with 100% FiO2  Status is: Inpatient  Remains inpatient appropriate because:Hemodynamically unstable, Ongoing diagnostic testing needed not appropriate for outpatient work up, Unsafe d/c plan and Inpatient level of care appropriate due  to severity of illness   Dispo: The patient is from: Home              Anticipated d/c is to: TBD              Anticipated d/c date is: 3 days              Patient currently is not medically stable to d/c.  Consultants:   Pulmonary Dr. [place] Bells   Procedures: None  Antimicrobials:  Anti-infectives (From admission, onward)   Start     Dose/Rate Route Frequency Ordered Stop   01/09/20 1200  cefTRIAXone (ROCEPHIN) 2 g in sodium chloride 0.9 % 100 mL IVPB     Discontinue     2 g 200 mL/hr over 30 Minutes Intravenous Every 24 hours 01/09/20 1135     01/09/20 1200  azithromycin (ZITHROMAX) 500 mg in sodium chloride 0.9 % 250 mL IVPB     Discontinue     500 mg 250 mL/hr over 60  Minutes Intravenous Every 24 hours 01/09/20 1135     01/09/20 1000  remdesivir 100 mg in sodium chloride 0.9 % 100 mL IVPB     Discontinue     100 mg 200 mL/hr over 30 Minutes Intravenous Daily 01/30/2020 1057 01/13/20 0959   01/27/2020 1100  remdesivir 100 mg in sodium chloride 0.9 % 100 mL IVPB        100 mg 200 mL/hr over 30 Minutes Intravenous Every 30 min 01/10/2020 1057 01/07/2020 1342      Subjective: Seen and examined at bedside this morning and she was in some respiratory distress and was tried to lay prone.  Saturations Dropping and so she was transferred to the stepdown unit.  She complains of some shortness of breath still.  No nausea or vomiting.  Denies any lightheadedness or dizziness.  Denies any chest pain.  Has had some diarrhea occasionally.  No other concerns or complaints at this time.  Objective: Vitals:   01/09/20 0850 01/09/20 0915 01/09/20 0939 01/09/20 1033  BP:  (!) 152/82    Pulse: 92 96    Resp: 19 (!) 24    Temp:  98.1 F (36.7 C)    TempSrc:  Oral    SpO2: (!) 87% (!) 85% (!) 89% (!) 86%  Weight:      Height:        Intake/Output Summary (Last 24 hours) at 01/09/2020 1318 Last data filed at 01/09/2020 0400 Gross per 24 hour  Intake 136.34 ml  Output --  Net 136.34 ml   Filed Weights   01/09/2020 1004  Weight: (!) 122.5 kg   Examination: Physical Exam:  Constitutional: WN/WD morbidly obese Caucasian female who is anxious and in some respiratory distress Eyes: PERRL, lids and conjunctivae normal, sclerae anicteric  ENMT: External Ears, Nose appear normal. Grossly normal hearing. Mucous membranes are moist.  Neck: Appears normal, supple, no cervical masses, normal ROM, no appreciable thyromegaly Respiratory: Diminished to auscultation bilaterally with coarse breath sounds and some wheezing; No appreciable rales or crackles. She has an increased respiratory effort and wearing supplemental O2 via [place] and a NRB Cardiovascular: RRR, no murmurs / rubs / gallops.  S1 and S2 auscultated. 1+ extremity edema.  Abdomen: Soft, non-tender, Distended 2/2 body habitus. Bowel sounds positive.  GU: Deferred. Musculoskeletal: No clubbing / cyanosis of digits/nails. No joint deformity upper and lower extremities.  Skin: No rashes, lesions, ulcers on a limited skin evaluation. No induration; Warm and dry.  Neurologic: CN 2-12 grossly intact with no focal deficits. Romberg sign and cerebellar reflexes not assessed.  Psychiatric: Normal judgment and insight. Alert and oriented x 3. Extremely Anixous mood and appropriate affect.   Data Reviewed: I have personally reviewed following labs and imaging studies  CBC: Recent Labs  Lab 01/06/20 1429 01/24/2020 1025 01/25/2020 2231 01/09/20 0036  WBC 2.5* 4.2 4.3 4.3  NEUTROABS 1.6* 3.6  --  3.5  HGB 15.0 14.7 14.8 15.1*  HCT 45.7 44.5 45.9 46.2*  MCV 86.7 86.6 88.3 88.5  PLT 88* 99* 101* 322*   Basic Metabolic Panel: Recent Labs  Lab 01/06/20 1429 01/31/2020 1025 01/25/2020 2231 01/09/20 0036  NA 134* 132*  --  137  K 3.4* 4.1  --  3.8  CL 96* 95*  --  99  CO2 26 27  --  29  GLUCOSE 304* 357*  --  319*  BUN 13 15  --  17  CREATININE 0.67 0.74 0.70 0.74  CALCIUM 8.2* 8.0*  --  8.0*   GFR: Estimated Creatinine Clearance: 102.6 mL/min (by C-G formula based on SCr of 0.74 mg/dL). Liver Function Tests: Recent Labs  Lab 01/06/20 1429 01/31/2020 1025 01/09/20 0036  AST 56* 79* 72*  ALT 49* 46* 43  ALKPHOS 104 114 115  BILITOT 0.5 0.9 1.0  PROT 7.1 6.4* 6.5  ALBUMIN 3.4* 3.1* 3.1*   No results for input(s): LIPASE, AMYLASE in the last 168 hours. No results for input(s): AMMONIA in the last 168 hours. Coagulation Profile: Recent Labs  Lab 01/06/20 1749  INR 1.0   Cardiac Enzymes: No results for input(s): CKTOTAL, CKMB, CKMBINDEX, TROPONINI in the last 168 hours. BNP (last 3 results) No results for input(s): PROBNP in the last 8760 hours. HbA1C: No results for input(s): HGBA1C in the last 72  hours. CBG: Recent Labs  Lab 01/06/20 1924 01/16/2020 1608 02/05/2020 2044 01/09/20 0735 01/09/20 1202  GLUCAP 293* 361* 285* 294*  294* 296*   Lipid Profile: Recent Labs    01/20/2020 1025  TRIG 167*   Thyroid Function Tests: No results for input(s): TSH, T4TOTAL, FREET4, T3FREE, THYROIDAB in the last 72 hours. Anemia Panel: Recent Labs    01/13/2020 1025  FERRITIN 1,012*   Sepsis Labs: Recent Labs  Lab 01/12/2020 1025 02/06/2020 1317  PROCALCITON 0.60  --   LATICACIDVEN 2.6* 2.1*    Recent Results (from the past 240 hour(s))  SARS Coronavirus 2 by RT PCR (hospital order, performed in [facility] hospital lab) Nasopharyngeal Nasopharyngeal Swab     Status: Abnormal   Collection Time: 01/26/2020 11:19 AM   Specimen: Nasopharyngeal Swab  Result Value Ref Range Status   SARS Coronavirus 2 POSITIVE (A) NEGATIVE Final    Comment: RESULT CALLED TO, READ BACK BY AND VERIFIED WITH:  SIMMS MARVA, RN @ 0254 ON 01/12/2020, [email or web address] (NOTE) SARS-CoV-2 target nucleic acids are DETECTED  SARS-CoV-2 RNA is generally detectable in upper respiratory specimens  during the acute phase of infection.  Positive results are indicative  of the presence of the identified virus, but do not rule out bacterial infection or co-infection with other pathogens not detected by the test.  Clinical correlation with patient history and  other diagnostic information is necessary to determine patient infection status.  The expected result is negative.  Fact Sheet for Patients:   StrictlyIdeas.no   Fact Sheet for Healthcare Providers:   BankingDealers.co.za    This test is not yet approved or cleared by the [place] FDA  and  has been authorized for detection and/or diagnosis of SARS-CoV-2 by FDA under an Emergency Use Authorization (EUA).  This EUA will remain in effect  (meaning this test can be used) for the duration of  the COVID-19 declaration  under Section 564(b)(1) of the Act, 21 U.S.C. section 360-bbb-3(b)(1), unless the authorization is terminated or revoked sooner.  Performed at [facility], [address] [place], [place] [address]      RN Pressure Injury Documentation:     Estimated body mass index is 46.34 kg/m as calculated from the following:   Height as of this encounter: _0  (1.626 m).   Weight as of this encounter: 122.5 kg.  Malnutrition Type:      Malnutrition Characteristics:      Nutrition Interventions:    Radiology Studies: DG Chest Port 1 View  Result Date: 01/28/2020 CLINICAL DATA:  Shortness of breath.  COVID-19 positive EXAM: PORTABLE CHEST 1 VIEW COMPARISON:  January 06, 2020 FINDINGS: There is ill-defined patchy airspace disease in the left mid lung and lung base regions. No consolidation. Heart size and pulmonary vascularity are normal. No adenopathy. There is postoperative change in the lower cervical region. IMPRESSION: Areas of somewhat ill-defined patchy airspace disease, likely due to atypical organism pneumonia. Heart size and pulmonary vascular normal. No adenopathy. Electronically Signed   By: Lowella Grip III M.D.   On: 01/18/2020 11:09   Scheduled Meds: . vitamin C  500 mg Oral Daily  . Chlorhexidine Gluconate Cloth  6 each Topical Daily  . enoxaparin (LOVENOX) injection  60 mg Subcutaneous QHS  . furosemide  40 mg Intravenous Q6H  . insulin aspart  0-15 Units Subcutaneous TID WC  . insulin aspart  15 Units Subcutaneous TID WC  . insulin glargine  80 Units Subcutaneous QHS  . Ipratropium-Albuterol  2 puff Inhalation Q6H  . mouth rinse  15 mL Mouth Rinse BID  . methylPREDNISolone (SOLU-MEDROL) injection  0.5 mg/kg Intravenous Q12H  . zinc sulfate  220 mg Oral Daily   Continuous Infusions: . sodium chloride Stopped (01/18/2020 1613)  . azithromycin 500 mg (01/09/20 1225)  . cefTRIAXone (ROCEPHIN)  IV Stopped (01/09/20 1258)  . remdesivir 100 mg in NS 100 mL  100 mg (01/09/20 0912)    LOS: 1 day   Kerney Elbe, DO [facility] PAGER is on Jal  If 7PM-7AM, please contact night-coverage [email or web address]

## 2020-01-09 NOTE — Progress Notes (Signed)
Rapid response notified of pt's worsening conditioning.  Upon entering the room patient was labored breathing with O2 sat's in the 70's using NRB @ 15 and HFNC @ 15.  Patient repositioned in bed to maximize respiratory abilities.  O2 sat's improved to low 80's.  Patient unable to maintain appropriate oxygen levels while minimally exerting herself.  Physician previously notified and decision made to transfer to [facility] unit and place on Heated HFNC.  Rapid response at bedside till transferred completed.  Sat's improved and maintained in high 80's once transitioned to heated HFNC.

## 2020-01-09 NOTE — Progress Notes (Signed)
PT on Dynamap at this time. RN aware that outside monitor is indicating "Tele Batt Empty".

## 2020-01-09 NOTE — Progress Notes (Signed)
eLink Physician-Brief Progress Note Patient Name: Alisha Stephenson DOB: 12-Aug-1965 MRN: [id number]   Date of Service  01/09/2020  HPI/Events of Note  Notified of desaturation, currently 89% On VAC 30/330/100%/14 PEEP, iT 0.81 peak pressure 30, I:E 1: 1.2 Synchronized on vent  eICU Interventions  Increase PEEP to 16 and decreased iTime to 0.7 Discussed with with bedside RN     Intervention Category Major Interventions: Hypoxemia - evaluation and management  Judd Lien 01/09/2020, 10:03 PM

## 2020-01-09 NOTE — Progress Notes (Signed)
Patient's oxygen saturations in the low 80's. This RN attempted to prone patient at this time. Patient's oxygen saturations dropped to 60% at this time. This RN tried to reposition patient, but oxygen sats remained in the 80's. This RN consulted with care team at this time. Patient was rapid responded and moved to ICU for higher level of care.

## 2020-01-09 NOTE — Progress Notes (Signed)
Daughter- Mendel Ryder- is to be the point of contact regarding information related to Alisha Stephenson. Her phone number is [phone]

## 2020-01-09 NOTE — Consult Note (Signed)
NAME:  Alisha Stephenson, MRN:  [id number], DOB:  1966-03-31, LOS: 1 ADMISSION DATE:  02/06/2020, CONSULTATION DATE:  8/3 REFERRING MD:  Alfredia Ferguson, CHIEF COMPLAINT:  Dyspnea   Brief History   54 y/o female admitted on 8/2 with severe acute respiratory failure with hypoxemia due to COVID 19 pneumonia.  She developed symptoms 1 week prior to admission.  History of present illness   54 y/o female presented to the [facility] emergency department on 8/2 complaining of one week of dyspnea, dry cough, body aches, fevers, chills, and fatigue.  She was diagnosed with COVID pneumonia and prescribed remdesivir, methylprednisolone 1gm/kg, and actemra.  One the morning of 8/3 she developed worsening hypoxemia and anxiety and was transferred to the ICU for heated high flow oxygen use.   When I see her she is complaining of her nose burning from the heated high flow oxygen.  She says she is tired.  She was seen in the [facility] ED on 7/31 for severe headache, had a CT head and MRI brain which were unrevealing.  Past Medical History  DM2 Diverticulitis Gallstones Ovarian cyst NAFLD ASthma  Significant Hospital Events   8/2 admit 8/3 ICU transfer  Consults:  PCCM  Procedures:    Significant Diagnostic Tests:  7/31 CT head > NAICP 7/31 MRI/MRA brain > no acute changes, possibly small aneurysm ACOM  Micro Data:  8/2 blood >  8/2 SARS COV 2 > positive  Antimicrobials:  8/2 remdesivir >  8/2 actemra >  8/2 solumedrol >   8/3 ceftriaxone >  8/3 azithro >   Interim history/subjective:  As above  Objective   Blood pressure (!) 152/82, pulse 96, temperature 98.1 F (36.7 C), temperature source Oral, resp. rate (!) 24, height _0  (1.626 m), weight (!) 122.5 kg, SpO2 (!) 86 %.    FiO2 (%):  [100 %] 100 %   Intake/Output Summary (Last 24 hours) at 01/09/2020 1123 Last data filed at 01/09/2020 0400 Gross per 24 hour  Intake 236.34 ml  Output --  Net 236.34 ml   Filed Weights   01/14/2020  1004  Weight: (!) 122.5 kg    Examination: General: Anxious, sitting up in bed HENT: NCAT OP clear PULM: Crackles bases B, normal effort CV: RRR, no mgr GI: BS+, soft, nontender MSK: normal bulk and tone Neuro: awake, alert, no distress, MAEW  8/3 CXR > images personally reviewed, bibasilar interstitial infiltrate  Resolved Hospital Problem list     Assessment & Plan:  ARDS due to COVID 19 pneumonia, worsening dyspnea and hypoxemia on 8/3 complicated by anxiety Procalcitonin slightly elevated on 8/2, unclear significance of this Add empiric CAP coverage on 8/3 Move to ICU Start heated high flow Add prn ativan for pneumonia Continue methylprednisolone Monitor LFT after administration of actemra Tolerate periods of hypoxemia, goal at rest is greater than 85% SaO2, with movement ideally above 75% Decision for intubation should be based on a change in mental status or physical evidence of ventilatory failure such as nasal flaring, accessory muscle use, paradoxical breathing Out of bed to chair as able Incentive spirometry is important, use every hour Prone positioning while in bed   Best practice:   Per TRH  Labs   CBC: Recent Labs  Lab 01/06/20 1429 01/07/2020 1025 01/16/2020 2231 01/09/20 0036  WBC 2.5* 4.2 4.3 4.3  NEUTROABS 1.6* 3.6  --  3.5  HGB 15.0 14.7 14.8 15.1*  HCT 45.7 44.5 45.9 46.2*  MCV 86.7 86.6  88.3 88.5  PLT 88* 99* 101* 110*    Basic Metabolic Panel: Recent Labs  Lab 01/06/20 1429 01/12/2020 1025 02/04/2020 2231 01/09/20 0036  NA 134* 132*  --  137  K 3.4* 4.1  --  3.8  CL 96* 95*  --  99  CO2 26 27  --  29  GLUCOSE 304* 357*  --  319*  BUN 13 15  --  17  CREATININE 0.67 0.74 0.70 0.74  CALCIUM 8.2* 8.0*  --  8.0*   GFR: Estimated Creatinine Clearance: 102.6 mL/min (by C-G formula based on SCr of 0.74 mg/dL). Recent Labs  Lab 01/06/20 1429 01/21/2020 1025 01/17/2020 1317 01/27/2020 2231 01/09/20 0036  PROCALCITON  --  0.60  --   --   --    WBC 2.5* 4.2  --  4.3 4.3  LATICACIDVEN  --  2.6* 2.1*  --   --     Liver Function Tests: Recent Labs  Lab 01/06/20 1429 01/17/2020 1025 01/09/20 0036  AST 56* 79* 72*  ALT 49* 46* 43  ALKPHOS 104 114 115  BILITOT 0.5 0.9 1.0  PROT 7.1 6.4* 6.5  ALBUMIN 3.4* 3.1* 3.1*   No results for input(s): LIPASE, AMYLASE in the last 168 hours. No results for input(s): AMMONIA in the last 168 hours.  ABG No results found for: PHART, PCO2ART, PO2ART, HCO3, TCO2, ACIDBASEDEF, O2SAT   Coagulation Profile: Recent Labs  Lab 01/06/20 1749  INR 1.0    Cardiac Enzymes: No results for input(s): CKTOTAL, CKMB, CKMBINDEX, TROPONINI in the last 168 hours.  HbA1C: Hgb A1c MFr Bld  Date/Time Value Ref Range Status  09/04/2019 03:22 PM 9.9 (H) 4.6 - 6.5 % Final    Comment:    Glycemic Control Guidelines for People with Diabetes:Non Diabetic:  <6%Goal of Therapy: <7%Additional Action Suggested:  >8%   04/17/2019 03:56 PM 9.6 (H) 4.6 - 6.5 % Final    Comment:    Glycemic Control Guidelines for People with Diabetes:Non Diabetic:  <6%Goal of Therapy: <7%Additional Action Suggested:  >8%     CBG: Recent Labs  Lab 01/06/20 1924 01/23/2020 1608 01/30/2020 2044 01/09/20 0735  GLUCAP 293* 361* 285* 294*  294*    Review of Systems:   Gen: Denies fever, chills, weight change, fatigue, night sweats HEENT: Denies blurred vision, double vision, hearing loss, tinnitus, sinus congestion, rhinorrhea, sore throat, neck stiffness, dysphagia PULM: per HPI CV: Denies chest pain, edema, orthopnea, paroxysmal nocturnal dyspnea, palpitations GI: Denies abdominal pain, nausea, vomiting, diarrhea, hematochezia, melena, constipation, change in bowel habits GU: Denies dysuria, hematuria, polyuria, oliguria, urethral discharge Endocrine: Denies hot or cold intolerance, polyuria, polyphagia or appetite change Derm: Denies rash, dry skin, scaling or peeling skin change Heme: Denies easy bruising, bleeding,  bleeding gums Neuro: Denies headache, numbness, weakness, slurred speech, loss of memory or consciousness   Past Medical History  She,  has a past medical history of Asthma, Diabetes mellitus without complication ([place]), Diverticulitis, Gallstones, IBS (irritable bowel syndrome), NAFLD (nonalcoholic fatty liver disease), and Ovarian cyst.   Surgical History    Past Surgical History:  Procedure Laterality Date  . ABDOMINAL HYSTERECTOMY    . CESAREAN SECTION    . CHOLECYSTECTOMY    . COLONOSCOPY     about 2011     Social History   reports that she has quit smoking. She has never used smokeless tobacco. She reports that she does not drink alcohol and does not use drugs.   Family History  Her family history includes Breast cancer in her maternal aunt and maternal grandmother; Diabetes in her father; Heart disease in her father. There is no history of Colon cancer or Esophageal cancer.   Allergies Allergies  Allergen Reactions  . Sulfa Antibiotics Rash and Other (See Comments)     Home Medications  Prior to Admission medications   Medication Sig Start Date End Date Taking? Authorizing Provider  albuterol (PROVENTIL HFA;VENTOLIN HFA) 108 (90 Base) MCG/ACT inhaler Inhale 2 puffs into the lungs every 6 (six) hours as needed for wheezing or shortness of breath. 03/23/18   Shelda Pal, DO  azithromycin (ZITHROMAX) 250 MG tablet Take 2 tabs the first day and then 1 tab daily until you run out. 01/02/20   Nani Ravens, Crosby Oyster, DO  benzonatate (TESSALON) 200 MG capsule Take 200 mg by mouth 3 (three) times daily. 12/31/19   [provider]  Blood Glucose Monitoring Suppl (ONETOUCH VERIO) w/Device KIT Test as directed once weekly to check blood sugar. 04/25/18   Shelda Pal, DO  dicyclomine (BENTYL) 10 MG capsule Take 1 capsule (10 mg total) by mouth 4 (four) times daily -  before meals and at bedtime. 09/13/19   Shelda Pal, DO  doxycycline  (VIBRA-TABS) 100 MG tablet Take 100 mg by mouth 2 (two) times daily. 10 day supply 12/31/19   [provider]  fluticasone (FLONASE) 50 MCG/ACT nasal spray PLACE 2 SPRAYS INTO BOTH NOSTRILS DAILY. 08/24/18   Saguier, Percell Miller, PA-C  glucose blood (ONETOUCH VERIO) test strip USE STRIP TO CHECK GLUCOSE THREE TIMES DAILY 08/31/18   Shelda Pal, DO  ibuprofen (ADVIL) 600 MG tablet Take 1 tablet (600 mg total) by mouth every 8 (eight) hours as needed. 01/06/20   Charlesetta Shanks, MD  insulin lispro (HUMALOG) 100 UNIT/ML KwikPen 15 units with each meal + 8 g/1 unit sliding scale. 06/13/19   Shelda Pal, DO  meloxicam (MOBIC) 7.5 MG tablet Take 1 tablet (7.5 mg total) by mouth daily. 12/11/19   Tasia Catchings, Amy V, PA-C  ONE TOUCH LANCETS MISC Use once a week to check blood sugar. 04/25/18   Shelda Pal, DO  tiZANidine (ZANAFLEX) 2 MG tablet Take 1 tablet (2 mg total) by mouth every 8 (eight) hours as needed for muscle spasms. 12/11/19   Yu, Amy V, PA-C  TOUJEO MAX SOLOSTAR 300 UNIT/ML Solostar Pen INJECT 62 UNITS INTO THE SKIN 2 (TWO) TIMES DAILY. Patient taking differently: Inject 62 Units into the skin 2 (two) times daily.  11/24/19   Shelda Pal, DO     Critical care time: 35 minutes      Roselie Awkward, MD [place] Pager: [phone] Cell: [phone] If no response, call [phone]

## 2020-01-09 NOTE — Progress Notes (Signed)
Continued to desat all night. Placed on 100% O2 non rebreather and only able to sustain O2 at 81%. Contact RTT Jessica, placed pt on 15L HFNC and 15L non rebreather to maintain O2 sat at least 85%. Stable with O2 sat at 87% with O2 interventions in place.

## 2020-01-09 NOTE — Progress Notes (Signed)
[facility] PCCM   Updated the patient's daughter by phone. They are very upset about her mother's condition worsening. I explained we are giving her the best treatments we know to give based on current understanding of care for critically ill patients with [place], MD [place] PCCM Pager: [phone] Cell: [phone] If no response, call [phone]

## 2020-01-09 NOTE — Telephone Encounter (Signed)
Caller: [name] (husband) Call back # [phone]  Patient husband [name] would like to talk to you in regards to his wife. She is currently in ICU due to Covid. [name] has some concerns and would like to speak with you.   Please advise.

## 2020-01-09 NOTE — Progress Notes (Addendum)
Inpatient Diabetes Program Recommendations  AACE/ADA: New Consensus Statement on Inpatient Glycemic Control (2015)  Target Ranges:  Prepandial:   less than 140 mg/dL      Peak postprandial:   less than 180 mg/dL (1-2 hours)      Critically ill patients:  140 - 180 mg/dL   Lab Results  Component Value Date   GLUCAP 294 (H) 01/09/2020   HGBA1C 9.9 (H) 09/04/2019    Review of Glycemic Control  Diabetes history: DM 2 Outpatient Diabetes medications: Toujeo 62 units bid, Humalog 15 units tid Current orders for Inpatient glycemic control:  Lantus 80 units qhs Novolog 0-15 units tid + hs  Decadron 10 mg given yest Solumedrol 61.25 mg Q12 hours  Inpatient Diabetes Program Recommendations:    Glucose 294 this am. Takes bid dosing of basal insulin at home.  -  Consider increasing Lantus to 50 units bid. -  add Tradjenta 5 mg Daily (utilized in COVID glycemic control order set based on research reducing mortality in DM pts)  Thanks,  Tama Headings RN, MSN, BC-ADM Inpatient Diabetes Coordinator Team Pager [phone] (8a-5p)

## 2020-01-09 NOTE — Progress Notes (Signed)
[facility] PCCM  Called back to bedside for worsening dyspnea and hypoxemia despite HHF oxygen, NRB mask and furosemide administration earlier today. Discussed with Mrs. [place], she voiced understanding.  We are attempting to have her call her family.  She reached her daughter by phone, attempting to reach her husband.  Roselie Awkward, MD [place] PCCM Pager: [phone] Cell: [phone] If no response, call [phone]

## 2020-01-09 NOTE — Progress Notes (Signed)
[facility] PCCM  Increased tachypnea Oxygenation about the same Anxiety has improved Furosemide now CXR now Close monitoring  Roselie Awkward, MD [place] PCCM Pager: [phone] Cell: [phone] If no response, call [phone]

## 2020-01-09 NOTE — Progress Notes (Signed)
PT currently 88% on previously documented 02 devices with no increased WOB. RT and Rapid Response RN agree to leave PT on current devices until PT reached 1223 . RT will not be able to transport PT on heated high flow device. Informed PT direct RN of this decision and that we can transport PT on NRB and [place] with surgical mask covering. Encouraged RN to contact RT with any concerns and when ready to transport. MD Alfredia Ferguson has been notified of this situation and confirms. ICU RT is aware of PT planned transfer and that ordered heated high flow nasal cannula system is currently in 1223 hooked up and ready for PT.

## 2020-01-09 NOTE — Procedures (Signed)
Intubation Procedure Note KINZEY SHERIFF [id number] June 12, 1965  Procedure: Intubation Indications: Respiratory insufficiency  Procedure Details Consent: Risks of procedure as well as the alternatives and risks of each were explained to the (patient/caregiver).  Consent for procedure obtained. Time Out: Verified patient identification, verified procedure, site/side was marked, verified correct patient position, special equipment/implants available, medications/allergies/relevent history reviewed, required imaging and test results available.  Performed  Drugs Versed 22m IV, Fentanyl 546m IV, Etomidate 2077mRocuronium 100m39m DL x 1 with MAC 3 blade Grade 2 view 7.5 ET tube passed through cords under direct visualization Placement confirmed with bilateral breath sounds, positive EtCO2 change and smoke in tube   Evaluation Hemodynamic Status: BP stable throughout; O2 sats: transiently fell during during procedure Patient's Current Condition: stable Complications: No apparent complications Patient did tolerate procedure well. Chest X-ray ordered to verify placement.  CXR: pending.   BrenRoselie Awkward/2021

## 2020-01-09 NOTE — Telephone Encounter (Signed)
Called the patients husband (the main concern was on Saturday at [facility]/all results were normal and her lungs were clear) As of yesterday SOB started. They gave her Actemra last night in the ER and it did not work.  They have given her twice  remdesivir and has not worked as well as giving her steroids. Their main concern is they do not want the remdesivir administered. They think that if her breathing is under control she will be fine.  The hospital has not updated them and recent RN they spoke to has been very rude to them. They would appreciate a call from her PCP as the patient has spoken so highly of PCP and would like to share their concerns and get his advice.

## 2020-01-09 NOTE — Telephone Encounter (Signed)
I called Alisha Stephenson's husband Alisha Stephenson. He had just spoken w Dr. [place] Bells and pt had just been intubated for ARDS. They were concerned about remdesivir which I stated she should absolutely be on this in addition to a steroid and Actemra at this time. They voiced understanding to this. All questions answered.

## 2020-01-10 ENCOUNTER — Inpatient Hospital Stay (HOSPITAL_COMMUNITY): Payer: PRIVATE HEALTH INSURANCE

## 2020-01-10 DIAGNOSIS — J9601 Acute respiratory failure with hypoxia: Secondary | ICD-10-CM

## 2020-01-10 DIAGNOSIS — K760 Fatty (change of) liver, not elsewhere classified: Secondary | ICD-10-CM

## 2020-01-10 DIAGNOSIS — J1282 Pneumonia due to coronavirus disease 2019: Secondary | ICD-10-CM

## 2020-01-10 LAB — CBC WITH DIFFERENTIAL/PLATELET
Abs Immature Granulocytes: 0.04 10*3/uL (ref 0.00–0.07)
Basophils Absolute: 0 10*3/uL (ref 0.0–0.1)
Basophils Relative: 0 %
Eosinophils Absolute: 0 10*3/uL (ref 0.0–0.5)
Eosinophils Relative: 0 %
HCT: 44.5 % (ref 36.0–46.0)
Hemoglobin: 13.7 g/dL (ref 12.0–15.0)
Immature Granulocytes: 1 %
Lymphocytes Relative: 12 %
Lymphs Abs: 0.8 10*3/uL (ref 0.7–4.0)
MCH: 28.7 pg (ref 26.0–34.0)
MCHC: 30.8 g/dL (ref 30.0–36.0)
MCV: 93.3 fL (ref 80.0–100.0)
Monocytes Absolute: 0.2 10*3/uL (ref 0.1–1.0)
Monocytes Relative: 3 %
Neutro Abs: 6 10*3/uL (ref 1.7–7.7)
Neutrophils Relative %: 84 %
Platelets: 168 10*3/uL (ref 150–400)
RBC: 4.77 MIL/uL (ref 3.87–5.11)
RDW: 13.7 % (ref 11.5–15.5)
WBC: 7 10*3/uL (ref 4.0–10.5)
nRBC: 0 % (ref 0.0–0.2)

## 2020-01-10 LAB — ECHOCARDIOGRAM COMPLETE
Area-P 1/2: 2.48 cm2
Height: 64 in
S' Lateral: 2.7 cm
Weight: 4319.96 oz

## 2020-01-10 LAB — POCT I-STAT 7, (LYTES, BLD GAS, ICA,H+H)
Acid-base deficit: 1 mmol/L (ref 0.0–2.0)
Bicarbonate: 24.8 mmol/L (ref 20.0–28.0)
Calcium, Ion: 0.94 mmol/L — ABNORMAL LOW (ref 1.15–1.40)
HCT: 37 % (ref 36.0–46.0)
Hemoglobin: 12.6 g/dL (ref 12.0–15.0)
O2 Saturation: 88 %
Patient temperature: 98.1
Potassium: 3.5 mmol/L (ref 3.5–5.1)
Sodium: 130 mmol/L — ABNORMAL LOW (ref 135–145)
TCO2: 26 mmol/L (ref 22–32)
pCO2 arterial: 42.7 mmHg (ref 32.0–48.0)
pH, Arterial: 7.371 (ref 7.350–7.450)
pO2, Arterial: 55 mmHg — ABNORMAL LOW (ref 83.0–108.0)

## 2020-01-10 LAB — GLUCOSE, CAPILLARY
Glucose-Capillary: 149 mg/dL — ABNORMAL HIGH (ref 70–99)
Glucose-Capillary: 157 mg/dL — ABNORMAL HIGH (ref 70–99)
Glucose-Capillary: 171 mg/dL — ABNORMAL HIGH (ref 70–99)
Glucose-Capillary: 176 mg/dL — ABNORMAL HIGH (ref 70–99)
Glucose-Capillary: 235 mg/dL — ABNORMAL HIGH (ref 70–99)
Glucose-Capillary: 263 mg/dL — ABNORMAL HIGH (ref 70–99)
Glucose-Capillary: 298 mg/dL — ABNORMAL HIGH (ref 70–99)
Glucose-Capillary: 384 mg/dL — ABNORMAL HIGH (ref 70–99)
Glucose-Capillary: 391 mg/dL — ABNORMAL HIGH (ref 70–99)
Glucose-Capillary: 424 mg/dL — ABNORMAL HIGH (ref 70–99)
Glucose-Capillary: 425 mg/dL — ABNORMAL HIGH (ref 70–99)
Glucose-Capillary: 439 mg/dL — ABNORMAL HIGH (ref 70–99)
Glucose-Capillary: 470 mg/dL — ABNORMAL HIGH (ref 70–99)
Glucose-Capillary: 493 mg/dL — ABNORMAL HIGH (ref 70–99)

## 2020-01-10 LAB — BLOOD GAS, ARTERIAL
Acid-base deficit: 1 mmol/L (ref 0.0–2.0)
Bicarbonate: 26.6 mmol/L (ref 20.0–28.0)
FIO2: 100
O2 Saturation: 87.2 %
Patient temperature: 97.9
pCO2 arterial: 57.6 mmHg — ABNORMAL HIGH (ref 32.0–48.0)
pH, Arterial: 7.283 — ABNORMAL LOW (ref 7.350–7.450)
pO2, Arterial: 58.9 mmHg — ABNORMAL LOW (ref 83.0–108.0)

## 2020-01-10 LAB — COMPREHENSIVE METABOLIC PANEL
ALT: 37 U/L (ref 0–44)
AST: 56 U/L — ABNORMAL HIGH (ref 15–41)
Albumin: 2.7 g/dL — ABNORMAL LOW (ref 3.5–5.0)
Alkaline Phosphatase: 113 U/L (ref 38–126)
Anion gap: 13 (ref 5–15)
BUN: 43 mg/dL — ABNORMAL HIGH (ref 6–20)
CO2: 25 mmol/L (ref 22–32)
Calcium: 7.6 mg/dL — ABNORMAL LOW (ref 8.9–10.3)
Chloride: 99 mmol/L (ref 98–111)
Creatinine, Ser: 1.61 mg/dL — ABNORMAL HIGH (ref 0.44–1.00)
GFR calc Af Amer: 41 mL/min — ABNORMAL LOW (ref >60)
GFR calc non Af Amer: 36 mL/min — ABNORMAL LOW (ref >60)
Glucose, Bld: 412 mg/dL — ABNORMAL HIGH (ref 70–99)
Potassium: 3.7 mmol/L (ref 3.5–5.1)
Sodium: 137 mmol/L (ref 135–145)
Total Bilirubin: 0.8 mg/dL (ref 0.3–1.2)
Total Protein: 5.7 g/dL — ABNORMAL LOW (ref 6.5–8.1)

## 2020-01-10 LAB — URINALYSIS, ROUTINE W REFLEX MICROSCOPIC
Bilirubin Urine: NEGATIVE
Glucose, UA: NEGATIVE mg/dL
Ketones, ur: NEGATIVE mg/dL
Leukocytes,Ua: NEGATIVE
Nitrite: NEGATIVE
Protein, ur: 30 mg/dL — AB
Specific Gravity, Urine: 1.017 (ref 1.005–1.030)
pH: 5 (ref 5.0–8.0)

## 2020-01-10 LAB — PHOSPHORUS
Phosphorus: 4 mg/dL (ref 2.5–4.6)
Phosphorus: 5.1 mg/dL — ABNORMAL HIGH (ref 2.5–4.6)

## 2020-01-10 LAB — FIBRINOGEN: Fibrinogen: 515 mg/dL — ABNORMAL HIGH (ref 210–475)

## 2020-01-10 LAB — MAGNESIUM
Magnesium: 2.3 mg/dL (ref 1.7–2.4)
Magnesium: 2.1 mg/dL (ref 1.7–2.4)

## 2020-01-10 LAB — D-DIMER, QUANTITATIVE: D-Dimer, Quant: 1.12 ug/mL-FEU — ABNORMAL HIGH (ref 0.00–0.50)

## 2020-01-10 LAB — SEDIMENTATION RATE: Sed Rate: 28 mm/hr — ABNORMAL HIGH (ref 0–22)

## 2020-01-10 LAB — PROCALCITONIN: Procalcitonin: 2.34 ng/mL

## 2020-01-10 LAB — C-REACTIVE PROTEIN: CRP: 12.7 mg/dL — ABNORMAL HIGH (ref <1.0)

## 2020-01-10 LAB — FERRITIN: Ferritin: 845 ng/mL — ABNORMAL HIGH (ref 11–307)

## 2020-01-10 LAB — TRIGLYCERIDES: Triglycerides: 341 mg/dL — ABNORMAL HIGH (ref <150)

## 2020-01-10 LAB — MRSA PCR SCREENING: MRSA by PCR: NEGATIVE

## 2020-01-10 LAB — LACTATE DEHYDROGENASE: LDH: 726 U/L — ABNORMAL HIGH (ref 98–192)

## 2020-01-10 MED ORDER — OXYCODONE HCL 5 MG/5ML PO SOLN
5.0000 mg | Freq: Four times a day (QID) | ORAL | Status: DC
  Administered 2020-01-10 – 2020-01-15 (×22): 5 mg via ORAL
  Filled 2020-01-10 (×23): qty 5

## 2020-01-10 MED ORDER — MIDAZOLAM 50MG/50ML (1MG/ML) PREMIX INFUSION: 0.5000 mg/h | INTRAVENOUS | Status: DC

## 2020-01-10 MED ORDER — FENTANYL CITRATE (PF) 100 MCG/2ML IJ SOLN
50.0000 ug | Freq: Once | INTRAMUSCULAR | Status: AC
  Administered 2020-01-30: 50 ug via INTRAVENOUS
  Filled 2020-01-10: qty 2

## 2020-01-10 MED ORDER — FENTANYL 2500MCG IN NS 250ML (10MCG/ML) PREMIX INFUSION
50.0000 ug/h | INTRAVENOUS | Status: DC
  Administered 2020-01-10: 300 ug/h via INTRAVENOUS
  Administered 2020-01-11: 250 ug/h via INTRAVENOUS
  Administered 2020-01-11 (×2): 300 ug/h via INTRAVENOUS
  Administered 2020-01-12 – 2020-01-14 (×7): 275 ug/h via INTRAVENOUS
  Administered 2020-01-14 – 2020-01-15 (×2): 300 ug/h via INTRAVENOUS
  Filled 2020-01-10 (×13): qty 250

## 2020-01-10 MED ORDER — SODIUM CHLORIDE 0.9 % IV SOLN: INTRAVENOUS | Status: DC

## 2020-01-10 MED ORDER — DEXTROSE-NACL 5-0.45 % IV SOLN: INTRAVENOUS | Status: DC

## 2020-01-10 MED ORDER — NOREPINEPHRINE 4 MG/250ML-% IV SOLN
0.0000 ug/min | INTRAVENOUS | Status: DC
  Filled 2020-01-10: qty 250

## 2020-01-10 MED ORDER — VECURONIUM BROMIDE 10 MG IV SOLR
10.0000 mg | INTRAVENOUS | Status: DC | PRN
  Administered 2020-01-10 – 2020-01-15 (×16): 10 mg via INTRAVENOUS
  Filled 2020-01-10 (×18): qty 10

## 2020-01-10 MED ORDER — INSULIN REGULAR(HUMAN) IN NACL 100-0.9 UT/100ML-% IV SOLN
INTRAVENOUS | Status: DC
  Administered 2020-01-10: 21 [IU]/h via INTRAVENOUS
  Administered 2020-01-10: 7.5 [IU]/h via INTRAVENOUS
  Administered 2020-01-10: 17 [IU]/h via INTRAVENOUS
  Filled 2020-01-10 (×3): qty 100

## 2020-01-10 MED ORDER — PROSOURCE TF PO LIQD
45.0000 mL | Freq: Three times a day (TID) | ORAL | Status: DC
  Administered 2020-01-10 – 2020-01-12 (×6): 45 mL
  Filled 2020-01-10 (×7): qty 45

## 2020-01-10 MED ORDER — MIDAZOLAM 50MG/50ML (1MG/ML) PREMIX INFUSION
0.5000 mg/h | INTRAVENOUS | Status: DC
  Administered 2020-01-10: 0.5 mg/h via INTRAVENOUS
  Filled 2020-01-10: qty 50

## 2020-01-10 MED ORDER — ALBUMIN HUMAN 5 % IV SOLN
12.5000 g | Freq: Once | INTRAVENOUS | Status: AC
  Administered 2020-01-10: 12.5 g via INTRAVENOUS
  Filled 2020-01-10: qty 250

## 2020-01-10 MED ORDER — SODIUM CHLORIDE 0.9 % IV SOLN
0.5000 mg/h | INTRAVENOUS | Status: DC
  Filled 2020-01-10: qty 10

## 2020-01-10 MED ORDER — DEXTROSE 50 % IV SOLN: 0.0000 mL | INTRAVENOUS | Status: DC | PRN

## 2020-01-10 MED ORDER — VITAL HIGH PROTEIN PO LIQD
1000.0000 mL | ORAL | Status: AC
  Administered 2020-01-10 – 2020-01-12 (×3): 1000 mL

## 2020-01-10 MED ORDER — MIDAZOLAM 50MG/50ML (1MG/ML) PREMIX INFUSION
2.0000 mg/h | INTRAVENOUS | Status: DC
  Administered 2020-01-10 (×2): 10 mg/h via INTRAVENOUS
  Administered 2020-01-10: 8 mg/h via INTRAVENOUS
  Administered 2020-01-11 (×2): 10 mg/h via INTRAVENOUS
  Administered 2020-01-11 – 2020-01-14 (×9): 7 mg/h via INTRAVENOUS
  Administered 2020-01-14: 8 mg/h via INTRAVENOUS
  Administered 2020-01-14: 9 mg/h via INTRAVENOUS
  Administered 2020-01-14: 8 mg/h via INTRAVENOUS
  Administered 2020-01-15: 10 mg/h via INTRAVENOUS
  Administered 2020-01-15 (×2): 9 mg/h via INTRAVENOUS
  Filled 2020-01-10 (×20): qty 50

## 2020-01-10 MED ORDER — ARTIFICIAL TEARS OPHTHALMIC OINT
1.0000 "application " | TOPICAL_OINTMENT | Freq: Three times a day (TID) | OPHTHALMIC | Status: DC
  Administered 2020-01-10 – 2020-01-14 (×14): 1 via OPHTHALMIC
  Filled 2020-01-10 (×2): qty 3.5

## 2020-01-10 MED ORDER — SODIUM CHLORIDE 0.9 % IV SOLN
0.0000 ug/kg/min | INTRAVENOUS | Status: DC
  Administered 2020-01-10 (×2): 3 ug/kg/min via INTRAVENOUS
  Administered 2020-01-11: 10 ug/kg/min via INTRAVENOUS
  Administered 2020-01-11 (×2): 3 ug/kg/min via INTRAVENOUS
  Filled 2020-01-10 (×9): qty 20

## 2020-01-10 MED ORDER — MIDAZOLAM BOLUS VIA INFUSION
1.0000 mg | INTRAVENOUS | Status: DC | PRN
  Administered 2020-01-12 – 2020-01-13 (×2): 2 mg via INTRAVENOUS
  Administered 2020-01-14: 1 mg via INTRAVENOUS
  Administered 2020-01-14: 2 mg via INTRAVENOUS
  Filled 2020-01-10: qty 2

## 2020-01-10 MED ORDER — FAMOTIDINE IN NACL 20-0.9 MG/50ML-% IV SOLN
20.0000 mg | Freq: Two times a day (BID) | INTRAVENOUS | Status: DC
  Administered 2020-01-10 – 2020-01-12 (×5): 20 mg via INTRAVENOUS
  Filled 2020-01-10 (×6): qty 50

## 2020-01-10 MED ORDER — FENTANYL BOLUS VIA INFUSION
50.0000 ug | INTRAVENOUS | Status: DC | PRN
  Administered 2020-01-10 – 2020-01-14 (×3): 50 ug via INTRAVENOUS
  Filled 2020-01-10: qty 50

## 2020-01-10 MED ORDER — CLONAZEPAM 1 MG PO TBDP
1.0000 mg | ORAL_TABLET | Freq: Two times a day (BID) | ORAL | Status: DC
  Administered 2020-01-10 – 2020-01-15 (×11): 1 mg via ORAL
  Filled 2020-01-10 (×6): qty 1
  Filled 2020-01-10: qty 2
  Filled 2020-01-10 (×4): qty 1
  Filled 2020-01-10: qty 2

## 2020-01-10 NOTE — Progress Notes (Signed)
  Echocardiogram 2D Echocardiogram has been performed.  Alisha Stephenson Alisha Stephenson 01/10/2020, 1:23 PM

## 2020-01-10 NOTE — Progress Notes (Signed)
[facility] PCCM  Echo performed, RV size/function within normal limits Oxygen level improved Plan prone Continuous nimbex Called [place] for an update They would like to face time  Roselie Awkward, MD [place] PCCM Pager: [phone] Cell: [phone] If no response, call [phone]

## 2020-01-10 NOTE — TOC Initial Note (Signed)
Transition of Care [facility]) - Initial/Assessment Note    Patient Details  Name: Alisha Stephenson MRN: [id number] Date of Birth: April 27, 1966  Transition of [facility]) CM/SW Contact:    Leeroy Cha, RN Phone Number: 01/10/2020, 7:52 AM  Clinical Narrative:                 covid positive on vent, unknown vaccination record.  Iv solu-medrol, iv abx, iv pressors, iv sedation. Following for p[rogression and toc needs. Expected Discharge Plan: Home/Self Care Barriers to Discharge: Continued Medical Work up   Patient Goals and CMS Choice Patient states their goals for this hospitalization and ongoing recovery are:: unable to state at this point intubated      Expected Discharge Plan and Services Expected Discharge Plan: Home/Self Care   Discharge Planning Services: CM Consult   Living arrangements for the past 2 months: Single Family Home                                      Prior Living Arrangements/Services Living arrangements for the past 2 months: Single Family Home Lives with:: Spouse                   Activities of Daily Living Home Assistive Devices/Equipment: None ADL Screening (condition at time of admission) Patient's cognitive ability adequate to safely complete daily activities?: Yes Is the patient deaf or have difficulty hearing?: No Does the patient have difficulty seeing, even when wearing glasses/contacts?: No Does the patient have difficulty concentrating, remembering, or making decisions?: No Patient able to express need for assistance with ADLs?: Yes Does the patient have difficulty dressing or bathing?: No Independently performs ADLs?: Yes (appropriate for developmental age) Does the patient have difficulty walking or climbing stairs?: No Weakness of Legs: None Weakness of Arms/Hands: None  Permission Sought/Granted                  Emotional Assessment       Orientation: : Fluctuating Orientation (Suspected and/or reported  [place]) Alcohol / Substance Use: Tobacco Use    Admission diagnosis:  Hypoxia [R09.02] Pneumonia due to COVID-19 virus [U07.1, J12.82] COVID-19 [U07.1] Acute respiratory failure due to COVID-19 ([place]) [U07.1, J96.00] Patient Active Problem List   Diagnosis Date Noted  . Pneumonia due to COVID-19 virus 02/03/2020  . Acute respiratory failure due to COVID-19 ([place]) 01/17/2020  . Diabetes mellitus type 1.5, managed as type 1 ([place]) 10/12/2018  . NAFLD (nonalcoholic fatty liver disease)   . Seborrheic keratoses 03/23/2018  . Allergic rhinitis 11/14/2014  . Asthma in adult 11/14/2014  . IBS (irritable bowel syndrome) 11/14/2014   PCP:  Shelda Pal, DO Pharmacy:   [facility] [place] [address] [place] [address] Phone: [phone] Fax: [phone]  [facility] [address] [place] [address] Phone: [phone] Fax: [phone]     Social Determinants of Health (SDOH) Interventions    Readmission Risk Interventions No flowsheet data found.

## 2020-01-10 NOTE — Procedures (Signed)
PCCM Video Bronchoscopy Procedure Note  Emergent procedure  Indication: ARDS, sudden drop in O2 saturation   Post Procedure Diagnosis: ARDS, clear airways  Location: [facility] ICU  Condition pre procedure: critically ill, on ventilator  Medications for procedure: versed infusion, fentanyl infusion per PAD protocol  Procedure description: The bronchoscope was introduced through the endotracheal tube and passed to the bilateral lungs to the level of the subsegmental bronchi throughout the tracheobronchial tree.  Airway exam revealed normal appearing airways.  Sharp carina, normal airways bilaterally without mucus, secretions, airway lesion, erythema, mass or edema.    Procedures performed: none  Specimens sent: none  Condition post procedure: stable  EBL: none  Complications: none  Roselie Awkward, MD [place] PCCM Pager: [phone] Cell: [phone] If no response, call [phone]

## 2020-01-10 NOTE — Progress Notes (Signed)
eLink Physician-Brief Progress Note Patient Name: Alisha Stephenson DOB: Dec 07, 1965 MRN: [id number]   Date of Service  01/10/2020  HPI/Events of Note  Notified of hypotension SBP 80s despite titrating down sedation  eICU Interventions  Will give a trial of albumin infusion and start norepinephrine if remains hypotensive     Intervention Category Major Interventions: Hypotension - evaluation and management  Judd Lien 01/10/2020, 3:14 AM

## 2020-01-10 NOTE — Progress Notes (Signed)
Patient proned w/o complication.

## 2020-01-10 NOTE — Progress Notes (Signed)
Initial Nutrition Assessment  DOCUMENTATION CODES:   Morbid obesity  INTERVENTION:  - will change TF regimen to Vital High Protein @ 25 ml/hr to advance by 10 ml every 12 hours to reach goal rate of 55 ml/hr with 45 ml Prosource TF TID. - at goal rate, this regimen + kcal from current propofol rate will provide 2023 kcal, 148 grams protein, and 1103 ml free water.  - free water flush to continue to be per CCM.  NUTRITION DIAGNOSIS:   Increased nutrient needs related to acute illness, catabolic illness (DJTTS-17 infection) as evidenced by estimated needs  GOAL:   Provide needs based on ASPEN/SCCM guidelines  MONITOR:   Vent status, TF tolerance, Labs, Weight trends  REASON FOR ASSESSMENT:   Ventilator, Consult Enteral/tube feeding initiation and management  ASSESSMENT:   54 y.o. female with medical history of type 2 DM and obesity. She presented to the ED at [facility] with SOB diarrhea x1 week, and cough. She was dx with COVID-19 one week prior to presentation to the ED.  Patient was intubated yesterday shortly before 1600 and OGT placed at that time.   Patient is receiving TF per protocol: Vital High Protein @ 40 ml/hr with 45 ml Prosource BID and order for 200 ml free water every 4 hours. This regimen is providing 1040 kcal, 106 grams protein, and 2002 ml free water.   Patient was discussed in rounds. Plan is for [facility] placement in order to feed patient post-pyloric. Plan is to prone patient.   Unable to perform NFPE this AM d/t events necessitating CCM and nursing intervention. Will do so at follow-up.   Weight on 8/2 was 270 lb. Will monitor weight status closely. No information in edema section of flow sheet at this time.   Per notes: - stat bronch done late AM today - ARDS - COVID-19 PNA - AKI - remdesivir ordered and s/p actemra dose on 8/2 - hyperglycemia with insulin drip ordered   Patient is currently intubated on ventilator support MV: 9  L/min Temp (24hrs), Avg:97.9 F (36.6 C), Min:96.9 F (36.1 C), Max:98.7 F (37.1 C) Propofol: 22.1 ml/hr (583 kcal).   Labs reviewed; CBGs: 439, 493, 470 mg/dl, BUN: 43 mg/dl, creatinine: 1.61 mg/dl, Ca: 7.6 mg/dl, GFR: 36 ml/min. Medications reviewed; 500 mg ascorbic acid/day, 12.5 g albumin x1 dose 8/4, 5 mg tradjenta/day, 61.25 mg solu-medrol BID, 100 mg remdesivir x1 dose/day (8/3-8/6), 220 mg zinc sulfate/day. IVF; NS @ 75 ml/hr.  Drips; versed @ 8 mg/hr, insulin @ 17 units/hr, fentanyl @ 200 mcg/hr, propofol @ 30 mcg/kg/min.     NUTRITION - FOCUSED PHYSICAL EXAM:  unable to complete at this time  Diet Order:   Diet Order            Diet Carb Modified Fluid consistency: Thin; Room service appropriate? Yes  Diet effective now                 EDUCATION NEEDS:   No education needs have been identified at this time  Skin:  Skin Assessment: Reviewed RN Assessment (MASD to abdomen and perineum)  Last BM:  8/3  Height:   Ht Readings from Last 1 Encounters:  01/09/20 5\' 4"  (1.626 m)    Weight:   Wt Readings from Last 1 Encounters:  01/20/2020 (!) 122.5 kg     Estimated Nutritional Needs:  Kcal:  1999 kcal [facility] Equation) Protein:  >/= 136 grams (2.5 grams/kg IBW) Fluid:  >/= 2.5 L/day  Jarome Matin, MS, RD, LDN, CNSC Inpatient Clinical Dietitian RD pager # available in [place]  After hours/weekend pager # available in [facility]

## 2020-01-10 NOTE — Progress Notes (Signed)
eLink Physician-Brief Progress Note Patient Name: Alisha Stephenson DOB: Feb 05, 1966 MRN: [id number]   Date of Service  01/10/2020  HPI/Events of Note  O2 sat now 94% with increase in PEEP to 16 and iTime down to 0.70 Driving pressure 13  eICU Interventions  I called to update family as there was a question regarding wanting to terminally extubated. I gave them an update and informed them that its too early too tell whether she will improve or continue to deteriorate. I did inform them that there are other measures that we may still do as part of management like paralytics and proning and they are in agreement. They request for "facetime" and I informed [place] nurses who will coordinate     Intervention Category Major Interventions: Respiratory failure - evaluation and management  Judd Lien 01/10/2020, 12:11 AM

## 2020-01-10 NOTE — Progress Notes (Signed)
Assisted tele visit to patient with husband.  Thomas, Eyal Greenhaw Renee, RN   

## 2020-01-10 NOTE — Progress Notes (Signed)
NAME:  Alisha Stephenson, MRN:  [id number], DOB:  [birth year], LOS: 2 ADMISSION DATE:  01/24/2020, CONSULTATION DATE:  8/3 REFERRING MD:  Alfredia Ferguson, CHIEF COMPLAINT:  Dyspnea   Brief History   54 y/o female admitted on 8/2 with severe acute respiratory failure with hypoxemia due to COVID 19 pneumonia.  She developed symptoms 1 week prior to admission.  Past Medical History  DM2 Diverticulitis Gallstones Ovarian cyst NAFLD Asthma  Significant Hospital Events   8/2 admit 8/3 ICU transfer  Consults:  PCCM  Procedures:  8/3 ETT >  8/3 PICC >   Significant Diagnostic Tests:  7/31 CT head > NAICP 7/31 MRI/MRA brain > no acute changes, possibly small aneurysm ACOM  Micro Data:  8/2 blood >  8/2 SARS COV 2 > positive 8/4 resp >   Antimicrobials:  8/2 remdesivir >  8/2 actemra >  8/2 solumedrol >   8/3 ceftriaxone >  8/3 azithro >   Interim history/subjective:  Ventilator dyssynchrony, renal function worse, glucose high,   Objective   Blood pressure (!) 108/57, pulse 88, temperature 97.9 F (36.6 C), temperature source Axillary, resp. rate (!) 32, height 5\' 4"  (1.626 m), weight (!) 122.5 kg, SpO2 (!) 81 %. CVP:  [8 mmHg] 8 mmHg  Vent Mode: PRVC FiO2 (%):  [100 %] 100 % Set Rate:  [30 bmp] 30 bmp Vt Set:  [330 mL] 330 mL PEEP:  [14 cmH20-16 cmH20] 16 cmH20 Plateau Pressure:  [26 cmH20-32 cmH20] 32 cmH20   Intake/Output Summary (Last 24 hours) at 01/10/2020 0736 Last data filed at 01/10/2020 2440 Gross per 24 hour  Intake 1000.55 ml  Output 0 ml  Net 1000.55 ml   Filed Weights   01/19/2020 1004  Weight: (!) 122.5 kg    Examination: General:  In bed on vent HENT: NCAT ETT in place PULM: CTA B, vent supported breathing CV: RRR, no mgr GI: BS+, soft, nontender MSK: normal bulk and tone Neuro: sedated on vent  CXR images 8/4 > ETT, PICC in place, no change in bilateral severe airspace disease  Resolved Hospital Problem list     Assessment & Plan:   ARDS due  to COVID 19 pneumonia > worsening oxygenation 8/4 plateau 32, driving pressure 14 Continue mechanical ventilation per ARDS protocol Target TVol 6-8cc/kgIBW Target Plateau Pressure < 30cm H20 Target driving pressure less than 15 cm of water Target PaO2 55-65: titrate PEEP/FiO2 per protocol As long as PaO2 to FiO2 ratio is less than 1:150 position in prone position for 16 hours a day Check CVP daily if CVL in place Target CVP less than 4, diurese as necessary Ventilator associated pneumonia prevention protocol 8/4 plan> paralyze, prone, hold lasix with worsening renal failure, solumedrol 1mg /kg and remdesivir per protocol  Bacterial pneumonia? Doesn't appear to be the case as afebrile, WBC OK but procalcitonin elevated slightly Check resp culture Continue ceftriaxone and azithro for now  Need for sedation for mechanical ventilation Ventilator dyssynchrony  RASS target -5 Change from PAD to neuromuscular blockade, prn protocol Fentanyl/versed infusions per protocol Add oxycodone and clonazepam  AKI Foley Hold lasix Monitor BMET and UOP Replace electrolytes as needed  Hyperglycemia Change to Insulin infusion  Best practice:  Diet: tube feeding Pain/Anxiety/Delirium protocol (if indicated): as above VAP protocol (if indicated): yes DVT prophylaxis: lovenox GI prophylaxis: famotidine Glucose control: SSI Mobility: bed rest Code Status: full Family Communication: I updated the patient's family at length today by phone Disposition: remain I ICU  Labs  CBC: Recent Labs  Lab 01/06/20 1429 01/25/2020 1025 01/28/2020 2231 01/09/20 0036 01/10/20 0346  WBC 2.5* 4.2 4.3 4.3 7.0  NEUTROABS 1.6* 3.6  --  3.5 6.0  HGB 15.0 14.7 14.8 15.1* 13.7  HCT 45.7 44.5 45.9 46.2* 44.5  MCV 86.7 86.6 88.3 88.5 93.3  PLT 88* 99* 101* 110* 322    Basic Metabolic Panel: Recent Labs  Lab 01/06/20 1429 02/03/2020 1025 02/03/2020 2231 01/09/20 0036 01/09/20 1230 01/10/20 0346  NA 134* 132*   --  137  --  137  K 3.4* 4.1  --  3.8  --  3.7  CL 96* 95*  --  99  --  99  CO2 26 27  --  29  --  25  GLUCOSE 304* 357*  --  319*  --  412*  BUN 13 15  --  17  --  43*  CREATININE 0.67 0.74 0.70 0.74  --  1.61*  CALCIUM 8.2* 8.0*  --  8.0*  --  7.6*  MG  --   --   --   --  2.5* 2.3  PHOS  --   --   --   --  2.6 4.0   GFR: Estimated Creatinine Clearance: 51 mL/min (A) (by C-G formula based on SCr of 1.61 mg/dL (H)). Recent Labs  Lab 01/24/2020 1025 01/28/2020 1317 01/31/2020 2231 01/09/20 0036 01/09/20 1230 01/10/20 0346  PROCALCITON 0.60  --   --   --  1.23 2.34  WBC 4.2  --  4.3 4.3  --  7.0  LATICACIDVEN 2.6* 2.1*  --   --   --   --     Liver Function Tests: Recent Labs  Lab 01/06/20 1429 01/07/2020 1025 01/09/20 0036 01/10/20 0346  AST 56* 79* 72* 56*  ALT 49* 46* 43 37  ALKPHOS 104 114 115 113  BILITOT 0.5 0.9 1.0 0.8  PROT 7.1 6.4* 6.5 5.7*  ALBUMIN 3.4* 3.1* 3.1* 2.7*   No results for input(s): LIPASE, AMYLASE in the last 168 hours. No results for input(s): AMMONIA in the last 168 hours.  ABG    Component Value Date/Time   PHART 7.283 (L) 01/10/2020 0345   PCO2ART 57.6 (H) 01/10/2020 0345   PO2ART 58.9 (L) 01/10/2020 0345   HCO3 26.6 01/10/2020 0345   ACIDBASEDEF 1.0 01/10/2020 0345   O2SAT 87.2 01/10/2020 0345     Coagulation Profile: Recent Labs  Lab 01/06/20 1749  INR 1.0    Cardiac Enzymes: No results for input(s): CKTOTAL, CKMB, CKMBINDEX, TROPONINI in the last 168 hours.  HbA1C: Hgb A1c MFr Bld  Date/Time Value Ref Range Status  09/04/2019 03:22 PM 9.9 (H) 4.6 - 6.5 % Final    Comment:    Glycemic Control Guidelines for People with Diabetes:Non Diabetic:  <6%Goal of Therapy: <7%Additional Action Suggested:  >8%   04/17/2019 03:56 PM 9.6 (H) 4.6 - 6.5 % Final    Comment:    Glycemic Control Guidelines for People with Diabetes:Non Diabetic:  <6%Goal of Therapy: <7%Additional Action Suggested:  >8%     CBG: Recent Labs  Lab  01/09/20 1202 01/09/20 1816 01/09/20 1940 01/09/20 2307 01/10/20 0302  GLUCAP 296* 304* 360* 350* 384*     Critical care time: 45 minutes    Roselie Awkward, MD [place] [facility] Pager: [phone] Cell: [phone] If no response, call [phone]

## 2020-01-10 NOTE — Progress Notes (Addendum)
Spiritual care received call from family member (cousin?) Vilinda Boehringer in [place], who requests family support.  Did not confirm nor deny pt admission to [facility].  Informed family that we would provide support with patients who are admitted.    Consulted with charge, who contacted family.  They state they are not in need of spiritual support at time - understand how to contact spiritual care support needs.

## 2020-01-10 NOTE — Progress Notes (Addendum)
Inpatient Diabetes Program Recommendations  AACE/ADA: New Consensus Statement on Inpatient Glycemic Control (2015)  Target Ranges:  Prepandial:   less than 140 mg/dL      Peak postprandial:   less than 180 mg/dL (1-2 hours)      Critically ill patients:  140 - 180 mg/dL   Lab Results  Component Value Date   GLUCAP 439 (H) 01/10/2020   HGBA1C 9.9 (H) 09/04/2019    Review of Glycemic Control Results for Alisha Stephenson, Alisha Stephenson (MRN [id number]) as of 01/10/2020 08:50  Ref. Range 01/09/2020 18:16 01/09/2020 19:40 01/09/2020 23:07 01/10/2020 03:02 01/10/2020 07:42  Glucose-Capillary Latest Ref Range: 70 - 99 mg/dL 304 (H) 360 (H) 350 (H) 384 (H) 439 (H)   Diabetes history: DM 2 Outpatient Diabetes medications: Toujeo 62 units bid, Humalog 15 units tid Current orders for Inpatient glycemic control:  Lantus 50 units bid Novolog 0-15 units q4 Novolog 15 units Q4 hours Tube Feed Coverage Tradjenta 5 mg Daily  Decadron 10 mg given 8/2 Solumedrol 61.25 mg Q12 hours Vital HP 40 ml/hour Intubated 8/3 Creat/BUN: 1.61/43  Inpatient Diabetes Program Recommendations:    Note pt to get second dose of Lantus 50 units this am.  -  Consider increasing Lantus to 60 units bid.   Thanks,  Tama Headings RN, MSN, BC-ADM Inpatient Diabetes Coordinator Team Pager [phone] (8a-5p)

## 2020-01-10 NOTE — Progress Notes (Signed)
[facility] PCCM   Called emergently to bedside for sudden drop in O2 saturation, visible cyanosis of skin Synchronous with ventilator  On exam Vitals:   01/10/20 0915 01/10/20 0930 01/10/20 0945 01/10/20 1000  BP: (!) 126/56 (!) 124/58 126/60 (!) 113/55  Pulse: 98 97 95 97  Resp: 13 19 (!) 22 18  Temp:      TempSrc:      SpO2: (!) 89% 90% 91% (!) 89%  Weight:      Height:       Vent Mode: PRVC FiO2 (%):  [100 %] 100 % Set Rate:  [30 bmp] 30 bmp Vt Set:  [330 mL] 330 mL PEEP:  [14 cmH20-18 cmH20] 18 cmH20 Plateau Pressure:  [26 cmH20-32 cmH20] 32 cmH20  Gen: face cyanotic NCAT: ETT in place PULM: some crackles bilaterally, bilateral air entry  CV : distant but tachy and regular Belly soft nontender  STAT bedside echo: very difficult to get windows, LVEF roughly appears normal, no evident pericardial effusion, cannot see RV   STAT CXR> no pneumothorax, severe bilateral airspace disease  STAT bronchoscopy> no mucus plugging, airways are clear  Impression: Severe hypoxemia from ARDS COVID 19 pneumonia AKI  Discussion: Sudden change in hypoxemia without mucus plugging or pneumothorax evident.  Suspect that she has a pulmonary embolism, but would like some evidence of this with an echocardiogram.  Plan: STAT echocardiogram ABG stat Continue full vent support as ordered Continue NMB If RV dilation on echo, then will need to consider TPA with family. If no RV dilation, then prone positioning  Additional CC time 40 minutes  Roselie Awkward, MD [place] [facility] Pager: [phone] Cell: [phone] If no response, call [phone]

## 2020-01-10 NOTE — Progress Notes (Addendum)
eLink Physician-Brief Progress Note Patient Name: Alisha Stephenson DOB: [birth year] MRN: [id number]   Date of Service  01/10/2020  HPI/Events of Note  Notified of episode of desaturation when sedation on hold for blood extraction improved when sedation resumed  eICU Interventions  paO2 59 which is acceptable for ARDS patients but low PF ratio Patient will benefit from proning  Versed drip added for sedation     Intervention Category Major Interventions: Hypoxemia - evaluation and management  Judd Lien 01/10/2020, 4:55 AM

## 2020-01-11 ENCOUNTER — Inpatient Hospital Stay (HOSPITAL_COMMUNITY): Payer: PRIVATE HEALTH INSURANCE

## 2020-01-11 LAB — BLOOD GAS, ARTERIAL
Acid-base deficit: 2 mmol/L (ref 0.0–2.0)
Bicarbonate: 25.8 mmol/L (ref 20.0–28.0)
Drawn by: 441261
FIO2: 80
MECHVT: 330 mL
O2 Saturation: 98.2 %
PEEP: 18 cmH2O
Patient temperature: 37
RATE: 30 resp/min
pCO2 arterial: 60.2 mmHg — ABNORMAL HIGH (ref 32.0–48.0)
pH, Arterial: 7.255 — ABNORMAL LOW (ref 7.350–7.450)
pO2, Arterial: 141 mmHg — ABNORMAL HIGH (ref 83.0–108.0)

## 2020-01-11 LAB — GLUCOSE, CAPILLARY
Glucose-Capillary: 177 mg/dL — ABNORMAL HIGH (ref 70–99)
Glucose-Capillary: 178 mg/dL — ABNORMAL HIGH (ref 70–99)
Glucose-Capillary: 189 mg/dL — ABNORMAL HIGH (ref 70–99)
Glucose-Capillary: 189 mg/dL — ABNORMAL HIGH (ref 70–99)
Glucose-Capillary: 191 mg/dL — ABNORMAL HIGH (ref 70–99)
Glucose-Capillary: 192 mg/dL — ABNORMAL HIGH (ref 70–99)
Glucose-Capillary: 194 mg/dL — ABNORMAL HIGH (ref 70–99)
Glucose-Capillary: 196 mg/dL — ABNORMAL HIGH (ref 70–99)
Glucose-Capillary: 197 mg/dL — ABNORMAL HIGH (ref 70–99)
Glucose-Capillary: 200 mg/dL — ABNORMAL HIGH (ref 70–99)
Glucose-Capillary: 202 mg/dL — ABNORMAL HIGH (ref 70–99)
Glucose-Capillary: 210 mg/dL — ABNORMAL HIGH (ref 70–99)
Glucose-Capillary: 212 mg/dL — ABNORMAL HIGH (ref 70–99)
Glucose-Capillary: 213 mg/dL — ABNORMAL HIGH (ref 70–99)
Glucose-Capillary: 222 mg/dL — ABNORMAL HIGH (ref 70–99)
Glucose-Capillary: 228 mg/dL — ABNORMAL HIGH (ref 70–99)
Glucose-Capillary: 242 mg/dL — ABNORMAL HIGH (ref 70–99)
Glucose-Capillary: 246 mg/dL — ABNORMAL HIGH (ref 70–99)
Glucose-Capillary: 261 mg/dL — ABNORMAL HIGH (ref 70–99)
Glucose-Capillary: 273 mg/dL — ABNORMAL HIGH (ref 70–99)
Glucose-Capillary: 282 mg/dL — ABNORMAL HIGH (ref 70–99)
Glucose-Capillary: 299 mg/dL — ABNORMAL HIGH (ref 70–99)

## 2020-01-11 LAB — CBC WITH DIFFERENTIAL/PLATELET
Abs Immature Granulocytes: 0.07 10*3/uL (ref 0.00–0.07)
Basophils Absolute: 0 10*3/uL (ref 0.0–0.1)
Basophils Relative: 0 %
Eosinophils Absolute: 0 10*3/uL (ref 0.0–0.5)
Eosinophils Relative: 0 %
HCT: 40.1 % (ref 36.0–46.0)
Hemoglobin: 12.9 g/dL (ref 12.0–15.0)
Immature Granulocytes: 1 %
Lymphocytes Relative: 11 %
Lymphs Abs: 0.7 10*3/uL (ref 0.7–4.0)
MCH: 29.5 pg (ref 26.0–34.0)
MCHC: 32.2 g/dL (ref 30.0–36.0)
MCV: 91.6 fL (ref 80.0–100.0)
Monocytes Absolute: 0.3 10*3/uL (ref 0.1–1.0)
Monocytes Relative: 5 %
Neutro Abs: 4.9 10*3/uL (ref 1.7–7.7)
Neutrophils Relative %: 83 %
Platelets: 142 10*3/uL — ABNORMAL LOW (ref 150–400)
RBC: 4.38 MIL/uL (ref 3.87–5.11)
RDW: 13.1 % (ref 11.5–15.5)
WBC: 5.9 10*3/uL (ref 4.0–10.5)
nRBC: 0 % (ref 0.0–0.2)

## 2020-01-11 LAB — COMPREHENSIVE METABOLIC PANEL
ALT: 37 U/L (ref 0–44)
AST: 67 U/L — ABNORMAL HIGH (ref 15–41)
Albumin: 2.6 g/dL — ABNORMAL LOW (ref 3.5–5.0)
Alkaline Phosphatase: 94 U/L (ref 38–126)
Anion gap: 8 (ref 5–15)
BUN: 54 mg/dL — ABNORMAL HIGH (ref 6–20)
CO2: 26 mmol/L (ref 22–32)
Calcium: 6.5 mg/dL — ABNORMAL LOW (ref 8.9–10.3)
Chloride: 103 mmol/L (ref 98–111)
Creatinine, Ser: 1.38 mg/dL — ABNORMAL HIGH (ref 0.44–1.00)
GFR calc Af Amer: 50 mL/min — ABNORMAL LOW (ref >60)
GFR calc non Af Amer: 43 mL/min — ABNORMAL LOW (ref >60)
Glucose, Bld: 271 mg/dL — ABNORMAL HIGH (ref 70–99)
Potassium: 3.5 mmol/L (ref 3.5–5.1)
Sodium: 137 mmol/L (ref 135–145)
Total Bilirubin: 0.6 mg/dL (ref 0.3–1.2)
Total Protein: 5.2 g/dL — ABNORMAL LOW (ref 6.5–8.1)

## 2020-01-11 LAB — LACTATE DEHYDROGENASE: LDH: 595 U/L — ABNORMAL HIGH (ref 98–192)

## 2020-01-11 LAB — URINE CULTURE: Culture: NO GROWTH

## 2020-01-11 LAB — PROCALCITONIN: Procalcitonin: 0.98 ng/mL

## 2020-01-11 LAB — D-DIMER, QUANTITATIVE: D-Dimer, Quant: 1.28 ug/mL-FEU — ABNORMAL HIGH (ref 0.00–0.50)

## 2020-01-11 LAB — SEDIMENTATION RATE: Sed Rate: 17 mm/hr (ref 0–22)

## 2020-01-11 LAB — MAGNESIUM: Magnesium: 2.2 mg/dL (ref 1.7–2.4)

## 2020-01-11 LAB — C-REACTIVE PROTEIN: CRP: 4.8 mg/dL — ABNORMAL HIGH (ref <1.0)

## 2020-01-11 LAB — PHOSPHORUS: Phosphorus: 5.2 mg/dL — ABNORMAL HIGH (ref 2.5–4.6)

## 2020-01-11 LAB — FIBRINOGEN: Fibrinogen: 346 mg/dL (ref 210–475)

## 2020-01-11 LAB — FERRITIN: Ferritin: 518 ng/mL — ABNORMAL HIGH (ref 11–307)

## 2020-01-11 MED ORDER — DEXTROSE-NACL 5-0.45 % IV SOLN: INTRAVENOUS | Status: DC

## 2020-01-11 MED ORDER — METHYLPREDNISOLONE SODIUM SUCC 40 MG IJ SOLR
40.0000 mg | Freq: Every day | INTRAMUSCULAR | Status: DC
  Administered 2020-01-12 – 2020-01-22 (×11): 40 mg via INTRAVENOUS
  Filled 2020-01-11 (×11): qty 1

## 2020-01-11 MED ORDER — INSULIN REGULAR(HUMAN) IN NACL 100-0.9 UT/100ML-% IV SOLN
INTRAVENOUS | Status: DC
  Administered 2020-01-11: 26 [IU]/h via INTRAVENOUS
  Administered 2020-01-11: 14 [IU]/h via INTRAVENOUS
  Filled 2020-01-11 (×2): qty 100

## 2020-01-11 MED ORDER — DEXTROSE 50 % IV SOLN: 0.0000 mL | INTRAVENOUS | Status: DC | PRN

## 2020-01-11 MED ORDER — LABETALOL HCL 5 MG/ML IV SOLN
10.0000 mg | INTRAVENOUS | Status: DC | PRN
  Administered 2020-01-14 (×2): 10 mg via INTRAVENOUS
  Filled 2020-01-11 (×2): qty 4

## 2020-01-11 MED ORDER — SODIUM CHLORIDE 0.9 % IV SOLN: INTRAVENOUS | Status: DC

## 2020-01-11 MED ORDER — INSULIN ASPART 100 UNIT/ML ~~LOC~~ SOLN
3.0000 [IU] | SUBCUTANEOUS | Status: DC
  Administered 2020-01-11: 6 [IU] via SUBCUTANEOUS

## 2020-01-11 MED ORDER — FUROSEMIDE 10 MG/ML IJ SOLN
40.0000 mg | Freq: Four times a day (QID) | INTRAMUSCULAR | Status: AC
  Administered 2020-01-11 (×2): 40 mg via INTRAVENOUS
  Filled 2020-01-11 (×2): qty 4

## 2020-01-11 MED ORDER — INSULIN REGULAR(HUMAN) IN NACL 100-0.9 UT/100ML-% IV SOLN
INTRAVENOUS | Status: DC
  Administered 2020-01-11: 11 [IU]/h via INTRAVENOUS
  Administered 2020-01-11: 9 [IU]/h via INTRAVENOUS
  Administered 2020-01-12: 13 [IU]/h via INTRAVENOUS
  Filled 2020-01-11 (×3): qty 100

## 2020-01-11 MED ORDER — DEXTROSE 10 % IV SOLN: INTRAVENOUS | Status: DC | PRN

## 2020-01-11 NOTE — Progress Notes (Signed)
Patient un-proned at this time. Tolerated fairly well. O2 sats dropped to 78-80%, but have recovered to 98%. FiO2 weaned to 80% and ETT resecured with tube holder @ 24.  RT will continue to monitor patient.

## 2020-01-11 NOTE — Progress Notes (Signed)
NAME:  Alisha Stephenson, MRN:  [id number], DOB:  1965-06-15, LOS: 3 ADMISSION DATE:  01/09/2020, CONSULTATION DATE:  8/3 REFERRING MD:  Alfredia Ferguson, CHIEF COMPLAINT:  Dyspnea   Brief History   54 y/o female admitted on 8/2 with severe acute respiratory failure with hypoxemia due to COVID 19 pneumonia.  She developed symptoms 1 week prior to admission.  Past Medical History  DM2 Diverticulitis Gallstones Ovarian cyst NAFLD Asthma  Significant Hospital Events   8/2 admit 8/3 ICU transfer, intubated 8/4 prone, paralyze  Consults:  PCCM  Procedures:  8/3 ETT >  8/3 PICC >   Significant Diagnostic Tests:  7/31 CT head > NAICP 7/31 MRI/MRA brain > no acute changes, possibly small aneurysm ACOM  Micro Data:  8/2 blood >  8/2 SARS COV 2 > positive 8/4 resp >  8/4 urine >   Antimicrobials:  8/2 remdesivir >  8/2 actemra >  8/2 solumedrol >   8/3 ceftriaxone >  8/3 azithro >   Interim history/subjective:   Paralyzed, prone yesterday Renal function better Making urine  Objective   Blood pressure (!) 152/65, pulse 91, temperature (!) 97.5 F (36.4 C), resp. rate (!) 30, height 5\' 4"  (1.626 m), weight (!) 122.5 kg, SpO2 98 %. CVP:  [10 mmHg] 10 mmHg  Vent Mode: PRVC FiO2 (%):  [90 %-100 %] 90 % Set Rate:  [30 bmp] 30 bmp Vt Set:  [330 mL] 330 mL PEEP:  [16 cmH20-18 cmH20] 18 cmH20 Plateau Pressure:  [31 cmH20-32 cmH20] 31 cmH20   Intake/Output Summary (Last 24 hours) at 01/11/2020 0756 Last data filed at 01/11/2020 0559 Gross per 24 hour  Intake 2296.68 ml  Output 1250 ml  Net 1046.68 ml   Filed Weights   01/14/2020 1004  Weight: (!) 122.5 kg    Examination:  General:  In bed on vent, prone HENT: NCAT ETT in place PULM: Crackles bases B, vent supported breathing CV: prone position, difficult to examin GI: BS+, soft, nontender MSK: normal bulk and tone Neuro: sedated on vent  CXR images 8/4 > ETT, PICC in place, no change in bilateral severe airspace  disease  Resolved Hospital Problem list     Assessment & Plan:   ARDS due to COVID 19 pneumonia > stable 8/5 after placing in prone position Continue mechanical ventilation per ARDS protocol Target TVol 6-8cc/kgIBW Target Plateau Pressure < 30cm H20 Target driving pressure less than 15 cm of water Target PaO2 55-65: titrate PEEP/FiO2 per protocol As long as PaO2 to FiO2 ratio is less than 1:150 position in prone position for 16 hours a day Check CVP daily if CVL in place Target CVP less than 4, diurese as necessary Ventilator associated pneumonia prevention protocol 8/5 continue remdesivir, decrease solumedrol to 40mg  daily, check supine abg to see if she needs lasix  Bacterial pneumonia? Doesn't appear to be the case as afebrile, WBC OK but procalcitonin elevated slightly Continue ceftriaxone/azithro F/u resp culture  Need for sedation for mechanical ventilation Ventilator dyssynchrony  RASS target -5 Consider changing to intermittent neuromuscular blockade protocol on 8/6 depending on oxygenation, etc Continue oxycodone and clonazpam  AKI> improved Foley Monitor BMET and UOP Replace electrolytes as needed  Hyperglycemia > not controled on endotool Reduce dose solumedrol Continue linagliptin Continue insulin infusion until blood sugar improved  Best practice:  Diet: tube feeding Pain/Anxiety/Delirium protocol (if indicated): as above VAP protocol (if indicated): yes DVT prophylaxis: lovenox GI prophylaxis: famotidine Glucose control: SSI Mobility: bed rest Code Status:  full Family Communication:updated [name] (husband) by FaceTime when I was in her room Disposition: remain in ICU  Labs   CBC: Recent Labs  Lab 01/06/20 1429 01/06/20 1429 01/27/2020 1025 01/18/2020 1025 01/15/2020 2231 01/09/20 0036 01/10/20 0346 01/10/20 1212 01/11/20 0312  WBC 2.5*   < > 4.2  --  4.3 4.3 7.0  --  5.9  NEUTROABS 1.6*  --  3.6  --   --  3.5 6.0  --  4.9  HGB 15.0   < > 14.7    < > 14.8 15.1* 13.7 12.6 12.9  HCT 45.7   < > 44.5   < > 45.9 46.2* 44.5 37.0 40.1  MCV 86.7   < > 86.6  --  88.3 88.5 93.3  --  91.6  PLT 88*   < > 99*  --  101* 110* 168  --  142*   < > = values in this interval not displayed.    Basic Metabolic Panel: Recent Labs  Lab 01/06/20 1429 01/06/20 1429 01/07/2020 1025 01/22/2020 2231 01/09/20 0036 01/09/20 1230 01/10/20 0346 01/10/20 1212 01/10/20 1811 01/11/20 0312  NA 134*   < > 132*  --  137  --  137 130*  --  137  K 3.4*   < > 4.1  --  3.8  --  3.7 3.5  --  3.5  CL 96*  --  95*  --  99  --  99  --   --  103  CO2 26  --  27  --  29  --  25  --   --  26  GLUCOSE 304*  --  357*  --  319*  --  412*  --   --  271*  BUN 13  --  15  --  17  --  43*  --   --  54*  CREATININE 0.67   < > 0.74 0.70 0.74  --  1.61*  --   --  1.38*  CALCIUM 8.2*  --  8.0*  --  8.0*  --  7.6*  --   --  6.5*  MG  --   --   --   --   --  2.5* 2.3  --  2.1 2.2  PHOS  --   --   --   --   --  2.6 4.0  --  5.1* 5.2*   < > = values in this interval not displayed.   GFR: Estimated Creatinine Clearance: 59.5 mL/min (A) (by C-G formula based on SCr of 1.38 mg/dL (H)). Recent Labs  Lab 01/18/2020 1025 01/27/2020 1025 02/03/2020 1317 01/12/2020 2231 01/09/20 0036 01/09/20 1230 01/10/20 0346 01/11/20 0312  PROCALCITON 0.60  --   --   --   --  1.23 2.34 0.98  WBC 4.2   < >  --  4.3 4.3  --  7.0 5.9  LATICACIDVEN 2.6*  --  2.1*  --   --   --   --   --    < > = values in this interval not displayed.    Liver Function Tests: Recent Labs  Lab 01/06/20 1429 01/07/2020 1025 01/09/20 0036 01/10/20 0346 01/11/20 0312  AST 56* 79* 72* 56* 67*  ALT 49* 46* 43 37 37  ALKPHOS 104 114 115 113 94  BILITOT 0.5 0.9 1.0 0.8 0.6  PROT 7.1 6.4* 6.5 5.7* 5.2*  ALBUMIN 3.4* 3.1* 3.1* 2.7* 2.6*   No results for input(s):  LIPASE, AMYLASE in the last 168 hours. No results for input(s): AMMONIA in the last 168 hours.  ABG    Component Value Date/Time   PHART 7.371 01/10/2020  1212   PCO2ART 42.7 01/10/2020 1212   PO2ART 55 (L) 01/10/2020 1212   HCO3 24.8 01/10/2020 1212   TCO2 26 01/10/2020 1212   ACIDBASEDEF 1.0 01/10/2020 1212   O2SAT 88.0 01/10/2020 1212     Coagulation Profile: Recent Labs  Lab 01/06/20 1749  INR 1.0    Cardiac Enzymes: No results for input(s): CKTOTAL, CKMB, CKMBINDEX, TROPONINI in the last 168 hours.  HbA1C: Hgb A1c MFr Bld  Date/Time Value Ref Range Status  09/04/2019 03:22 PM 9.9 (H) 4.6 - 6.5 % Final    Comment:    Glycemic Control Guidelines for People with Diabetes:Non Diabetic:  <6%Goal of Therapy: <7%Additional Action Suggested:  >8%   04/17/2019 03:56 PM 9.6 (H) 4.6 - 6.5 % Final    Comment:    Glycemic Control Guidelines for People with Diabetes:Non Diabetic:  <6%Goal of Therapy: <7%Additional Action Suggested:  >8%     CBG: Recent Labs  Lab 01/11/20 0103 01/11/20 0212 01/11/20 0407 01/11/20 0547 01/11/20 0653  GLUCAP 191* 213* 273* 282* 299*     Critical care time: 35 minutes    Roselie Awkward, MD [place] [facility] Pager: [phone] Cell: [phone] If no response, call [phone]

## 2020-01-11 NOTE — TOC Progression Note (Signed)
Transition of Care [facility]) - Progression Note    Patient Details  Name: Alisha Stephenson MRN: [id number] Date of Birth: 24-Oct-1965  Transition of [facility]) CM/SW Contact  Leeroy Cha, RN Phone Number: 01/11/2020, 7:39 AM  Clinical Narrative:    Remains vent dep. At 100% fi02,unvacci9nated female,positive for covid, iv sedation, iv abx, iv insulin, iv steroids,iv pressors, iv remdesviri through [id number]. Following for progression and toc needs.  Expected Discharge Plan: Home/Self Care Barriers to Discharge: Continued Medical Work up  Expected Discharge Plan and Services Expected Discharge Plan: Home/Self Care   Discharge Planning Services: CM Consult   Living arrangements for the past 2 months: Single Family Home                                       Social Determinants of Health (SDOH) Interventions    Readmission Risk Interventions No flowsheet data found.

## 2020-01-11 NOTE — Progress Notes (Addendum)
[place] Progress Note Patient Name: GENELL THEDE DOB: 05/18/1966 MRN: [id number]   Date of Service  01/11/2020  HPI/Events of Note  1. Last 4 glucose levels are < 200. RN asked to switch to subcutaneous insulin 2. HTN  eICU Interventions  1. Stop D5 1/2 NS, SSI ordered for now. Asked RN to continue hourly checks for another 2 hours and then switch to q4h, may need long acting form if continues to remain high /is also on tube feeds and steroids  2. PRN labetalol ordered, HR is in 80s      Intervention Category Major Interventions: Hyperglycemia - active titration of insulin therapy;Hypertension - evaluation and management  Braeden Kennan G Kuba Shepherd 01/11/2020, 12:20 AM   4.30 am Repeat rise in glucose levels x 3 since discontinuation of IV insulin Per protocol, to switch back to IV insulin since she continues on IV steroids  D/W Rn

## 2020-01-12 ENCOUNTER — Inpatient Hospital Stay (HOSPITAL_COMMUNITY): Payer: PRIVATE HEALTH INSURANCE

## 2020-01-12 LAB — CULTURE, RESPIRATORY W GRAM STAIN
Culture: NORMAL
Gram Stain: NONE SEEN

## 2020-01-12 LAB — GLUCOSE, CAPILLARY
Glucose-Capillary: 185 mg/dL — ABNORMAL HIGH (ref 70–99)
Glucose-Capillary: 201 mg/dL — ABNORMAL HIGH (ref 70–99)
Glucose-Capillary: 191 mg/dL — ABNORMAL HIGH (ref 70–99)
Glucose-Capillary: 178 mg/dL — ABNORMAL HIGH (ref 70–99)
Glucose-Capillary: 161 mg/dL — ABNORMAL HIGH (ref 70–99)
Glucose-Capillary: 160 mg/dL — ABNORMAL HIGH (ref 70–99)
Glucose-Capillary: 133 mg/dL — ABNORMAL HIGH (ref 70–99)
Glucose-Capillary: 151 mg/dL — ABNORMAL HIGH (ref 70–99)
Glucose-Capillary: 125 mg/dL — ABNORMAL HIGH (ref 70–99)
Glucose-Capillary: 142 mg/dL — ABNORMAL HIGH (ref 70–99)
Glucose-Capillary: 215 mg/dL — ABNORMAL HIGH (ref 70–99)
Glucose-Capillary: 197 mg/dL — ABNORMAL HIGH (ref 70–99)
Glucose-Capillary: 205 mg/dL — ABNORMAL HIGH (ref 70–99)
Glucose-Capillary: 178 mg/dL — ABNORMAL HIGH (ref 70–99)
Glucose-Capillary: 308 mg/dL — ABNORMAL HIGH (ref 70–99)
Glucose-Capillary: 339 mg/dL — ABNORMAL HIGH (ref 70–99)

## 2020-01-12 LAB — CBC WITH DIFFERENTIAL/PLATELET
Abs Immature Granulocytes: 0.08 10*3/uL — ABNORMAL HIGH (ref 0.00–0.07)
Basophils Absolute: 0 10*3/uL (ref 0.0–0.1)
Basophils Relative: 0 %
Eosinophils Absolute: 0 10*3/uL (ref 0.0–0.5)
Eosinophils Relative: 0 %
HCT: 40 % (ref 36.0–46.0)
Hemoglobin: 12.6 g/dL (ref 12.0–15.0)
Immature Granulocytes: 1 %
Lymphocytes Relative: 14 %
Lymphs Abs: 0.8 10*3/uL (ref 0.7–4.0)
MCH: 29.3 pg (ref 26.0–34.0)
MCHC: 31.5 g/dL (ref 30.0–36.0)
MCV: 93 fL (ref 80.0–100.0)
Monocytes Absolute: 0.4 10*3/uL (ref 0.1–1.0)
Monocytes Relative: 7 %
Neutro Abs: 4.9 10*3/uL (ref 1.7–7.7)
Neutrophils Relative %: 78 %
Platelets: 147 10*3/uL — ABNORMAL LOW (ref 150–400)
RBC: 4.3 MIL/uL (ref 3.87–5.11)
RDW: 13.3 % (ref 11.5–15.5)
WBC: 6.2 10*3/uL (ref 4.0–10.5)
nRBC: 0.3 % — ABNORMAL HIGH (ref 0.0–0.2)

## 2020-01-12 LAB — COMPREHENSIVE METABOLIC PANEL
ALT: 53 U/L — ABNORMAL HIGH (ref 0–44)
AST: 90 U/L — ABNORMAL HIGH (ref 15–41)
Albumin: 2.7 g/dL — ABNORMAL LOW (ref 3.5–5.0)
Alkaline Phosphatase: 94 U/L (ref 38–126)
Anion gap: 7 (ref 5–15)
BUN: 70 mg/dL — ABNORMAL HIGH (ref 6–20)
CO2: 29 mmol/L (ref 22–32)
Calcium: 6.9 mg/dL — ABNORMAL LOW (ref 8.9–10.3)
Chloride: 106 mmol/L (ref 98–111)
Creatinine, Ser: 1.24 mg/dL — ABNORMAL HIGH (ref 0.44–1.00)
GFR calc Af Amer: 57 mL/min — ABNORMAL LOW (ref >60)
GFR calc non Af Amer: 49 mL/min — ABNORMAL LOW (ref >60)
Glucose, Bld: 207 mg/dL — ABNORMAL HIGH (ref 70–99)
Potassium: 4.3 mmol/L (ref 3.5–5.1)
Sodium: 142 mmol/L (ref 135–145)
Total Bilirubin: 0.5 mg/dL (ref 0.3–1.2)
Total Protein: 5.1 g/dL — ABNORMAL LOW (ref 6.5–8.1)

## 2020-01-12 LAB — LACTATE DEHYDROGENASE: LDH: 572 U/L — ABNORMAL HIGH (ref 98–192)

## 2020-01-12 LAB — BASIC METABOLIC PANEL
Anion gap: 8 (ref 5–15)
Anion gap: 11 (ref 5–15)
BUN: 68 mg/dL — ABNORMAL HIGH (ref 6–20)
BUN: 71 mg/dL — ABNORMAL HIGH (ref 6–20)
CO2: 27 mmol/L (ref 22–32)
CO2: 26 mmol/L (ref 22–32)
Calcium: 6.8 mg/dL — ABNORMAL LOW (ref 8.9–10.3)
Calcium: 6.9 mg/dL — ABNORMAL LOW (ref 8.9–10.3)
Chloride: 107 mmol/L (ref 98–111)
Chloride: 106 mmol/L (ref 98–111)
Creatinine, Ser: 1.04 mg/dL — ABNORMAL HIGH (ref 0.44–1.00)
Creatinine, Ser: 1.09 mg/dL — ABNORMAL HIGH (ref 0.44–1.00)
GFR calc Af Amer: >60 mL/min (ref >60)
GFR calc Af Amer: >60 mL/min (ref >60)
GFR calc non Af Amer: >60 mL/min (ref >60)
GFR calc non Af Amer: 57 mL/min — ABNORMAL LOW (ref >60)
Glucose, Bld: 153 mg/dL — ABNORMAL HIGH (ref 70–99)
Glucose, Bld: 205 mg/dL — ABNORMAL HIGH (ref 70–99)
Potassium: 4.1 mmol/L (ref 3.5–5.1)
Potassium: 4.2 mmol/L (ref 3.5–5.1)
Sodium: 142 mmol/L (ref 135–145)
Sodium: 143 mmol/L (ref 135–145)

## 2020-01-12 LAB — BLOOD GAS, ARTERIAL
Acid-Base Excess: 0.4 mmol/L (ref 0.0–2.0)
Bicarbonate: 28.1 mmol/L — ABNORMAL HIGH (ref 20.0–28.0)
Drawn by: 441261
FIO2: 70
MECHVT: 330 mL
O2 Saturation: 88.3 %
PEEP: 16 cmH2O
Patient temperature: 36.7
RATE: 30 resp/min
pCO2 arterial: 61 mmHg — ABNORMAL HIGH (ref 32.0–48.0)
pH, Arterial: 7.283 — ABNORMAL LOW (ref 7.350–7.450)
pO2, Arterial: 57 mmHg — ABNORMAL LOW (ref 83.0–108.0)

## 2020-01-12 LAB — FERRITIN: Ferritin: 543 ng/mL — ABNORMAL HIGH (ref 11–307)

## 2020-01-12 LAB — FIBRINOGEN: Fibrinogen: 356 mg/dL (ref 210–475)

## 2020-01-12 LAB — D-DIMER, QUANTITATIVE: D-Dimer, Quant: 3.41 ug/mL-FEU — ABNORMAL HIGH (ref 0.00–0.50)

## 2020-01-12 LAB — C-REACTIVE PROTEIN: CRP: 2.7 mg/dL — ABNORMAL HIGH (ref <1.0)

## 2020-01-12 LAB — SEDIMENTATION RATE: Sed Rate: 17 mm/hr (ref 0–22)

## 2020-01-12 MED ORDER — PROSOURCE TF PO LIQD
90.0000 mL | Freq: Two times a day (BID) | ORAL | Status: DC
  Administered 2020-01-12 – 2020-01-16 (×8): 90 mL
  Filled 2020-01-12 (×9): qty 90

## 2020-01-12 MED ORDER — FUROSEMIDE 10 MG/ML IJ SOLN
40.0000 mg | Freq: Four times a day (QID) | INTRAMUSCULAR | Status: AC
  Administered 2020-01-12 (×2): 40 mg via INTRAVENOUS
  Filled 2020-01-12 (×2): qty 4

## 2020-01-12 MED ORDER — ENOXAPARIN SODIUM 80 MG/0.8ML ~~LOC~~ SOLN
70.0000 mg | Freq: Every day | SUBCUTANEOUS | Status: DC
  Administered 2020-01-12 – 2020-01-14 (×3): 70 mg via SUBCUTANEOUS
  Filled 2020-01-12 (×4): qty 0.7

## 2020-01-12 MED ORDER — FUROSEMIDE 10 MG/ML IJ SOLN
40.0000 mg | Freq: Four times a day (QID) | INTRAMUSCULAR | Status: DC
  Administered 2020-01-12: 40 mg via INTRAVENOUS
  Filled 2020-01-12: qty 4

## 2020-01-12 MED ORDER — INSULIN ASPART 100 UNIT/ML ~~LOC~~ SOLN
10.0000 [IU] | SUBCUTANEOUS | Status: DC
  Administered 2020-01-12 – 2020-01-13 (×4): 10 [IU] via SUBCUTANEOUS

## 2020-01-12 MED ORDER — DEXTROSE 10 % IV SOLN: INTRAVENOUS | Status: DC | PRN

## 2020-01-12 MED ORDER — VITAL 1.5 CAL PO LIQD
1000.0000 mL | ORAL | Status: DC
  Administered 2020-01-12 – 2020-01-15 (×3): 1000 mL
  Filled 2020-01-12 (×7): qty 1000

## 2020-01-12 MED ORDER — INSULIN DETEMIR 100 UNIT/ML ~~LOC~~ SOLN
31.0000 [IU] | Freq: Two times a day (BID) | SUBCUTANEOUS | Status: DC
  Administered 2020-01-12 (×2): 31 [IU] via SUBCUTANEOUS
  Filled 2020-01-12 (×2): qty 0.31

## 2020-01-12 MED ORDER — INSULIN ASPART 100 UNIT/ML ~~LOC~~ SOLN
3.0000 [IU] | SUBCUTANEOUS | Status: DC
  Administered 2020-01-12: 6 [IU] via SUBCUTANEOUS
  Administered 2020-01-12 – 2020-01-13 (×3): 9 [IU] via SUBCUTANEOUS

## 2020-01-12 NOTE — Progress Notes (Signed)
NAME:  Alisha Stephenson, MRN:  [id number], DOB:  26-Aug-1965, LOS: 4 ADMISSION DATE:  01/09/2020, CONSULTATION DATE:  8/3 REFERRING MD:  Alfredia Ferguson, CHIEF COMPLAINT:  Dyspnea   Brief History   54 y/o female admitted on 8/2 with severe acute respiratory failure with hypoxemia due to COVID 19 pneumonia.  She developed symptoms 1 week prior to admission.  Past Medical History  DM2 Diverticulitis Gallstones Ovarian cyst NAFLD Asthma  Significant Hospital Events   8/2 admit 8/3 ICU transfer, intubated 8/4 prone, paralyze 8/5   Consults:  PCCM  Procedures:  8/3 ETT >  8/3 PICC >   Significant Diagnostic Tests:  7/31 CT head > NAICP 7/31 MRI/MRA brain > no acute changes, possibly small aneurysm ACOM  Micro Data:  8/2 blood >  8/2 SARS COV 2 > positive 8/4 resp >  8/4 urine >   Antimicrobials:  8/2 remdesivir >  8/2 actemra >  8/2 solumedrol >   8/3 ceftriaxone >  8/3 azithro >   Interim history/subjective:   Vomited last night  Check ABG likey change paralytic to prn Diurese again  Objective   Blood pressure 129/62, pulse 87, temperature 98.2 F (36.8 C), resp. rate (!) 30, height 5\' 4"  (1.626 m), weight 133.6 kg, SpO2 (!) 84 %. CVP:  [14 mmHg] 14 mmHg  Vent Mode: PRVC FiO2 (%):  [70 %-90 %] 70 % Set Rate:  [30 bmp] 30 bmp Vt Set:  [330 mL] 330 mL PEEP:  [16 cmH20-18 cmH20] 16 cmH20 Plateau Pressure:  [30 cmH20-32 cmH20] 31 cmH20   Intake/Output Summary (Last 24 hours) at 01/12/2020 0726 Last data filed at 01/12/2020 0600 Gross per 24 hour  Intake 3964.12 ml  Output 1800 ml  Net 2164.12 ml   Filed Weights   01/19/2020 1004 01/12/20 0500  Weight: (!) 122.5 kg 133.6 kg    Examination:  General:  In bed on vent HENT: NCAT ETT in place PULM: Crackles bases B, vent supported breathing CV: RRR, no mgr GI: BS+, soft, nontender MSK: normal bulk and tone Neuro: sedated on vent  CXR images 8/6 > severe bilateral airspace disease, bilateral  effusions  Resolved Hospital Problem list     Assessment & Plan:   ARDS due to COVID 19 pneumonia > worsening oxygenation again on 8/6, bilateral effusions, presumed acute pulmonary edema Continue mechanical ventilation per ARDS protocol Target TVol 6-8cc/kgIBW Target Plateau Pressure < 30cm H20 Target driving pressure less than 15 cm of water Target PaO2 55-65: titrate PEEP/FiO2 per protocol As long as PaO2 to FiO2 ratio is less than 1:150 position in prone position for 16 hours a day Check CVP daily if CVL in place Target CVP less than 4, diurese as necessary Ventilator associated pneumonia prevention protocol 8/6 prone again, increase frequency of furosemide, remdesivir ends today, solumedrol 40mg  daily x 7 days  Bacterial pneumonia? Doesn't appear to be the case as afebrile, cultures negative Stop ceftriaxone and azithro  Need for sedation for mechanical ventilation Ventilator dyssynchrony  RASS target -5 Change to intermittent neuromuscular blockade protocol Continue fentanyl/versed infusion Continue oxycodone and clonazepam  AKI> improved Monitor BMET and UOP Replace electrolytes as needed Foley catheter > change to purewick on 8/7 if no prone  Hyperglycemia > not controled on endotool Change to regular insulin (SSI, Tube feeding coverage) from endotool today  Best practice:  Diet: tube feeding Pain/Anxiety/Delirium protocol (if indicated): as above VAP protocol (if indicated): yes DVT prophylaxis: lovenox GI prophylaxis: famotidine Glucose control: SSI Mobility:  bed rest Code Status: full Family Communication:updated [name] (husband) by FaceTime when I was in her room again on 8/6 Disposition: remain in ICU  Labs   CBC: Recent Labs  Lab 02/04/2020 1025 01/29/2020 1025 01/30/2020 2231 01/25/2020 2231 01/09/20 0036 01/10/20 0346 01/10/20 1212 01/11/20 0312 01/12/20 0213  WBC 4.2   < > 4.3  --  4.3 7.0  --  5.9 6.2  NEUTROABS 3.6  --   --   --  3.5 6.0  --   4.9 4.9  HGB 14.7   < > 14.8   < > 15.1* 13.7 12.6 12.9 12.6  HCT 44.5   < > 45.9   < > 46.2* 44.5 37.0 40.1 40.0  MCV 86.6   < > 88.3  --  88.5 93.3  --  91.6 93.0  PLT 99*   < > 101*  --  110* 168  --  142* 147*   < > = values in this interval not displayed.    Basic Metabolic Panel: Recent Labs  Lab 01/09/2020 1025 01/29/2020 1025 01/27/2020 2231 01/09/20 0036 01/09/20 1230 01/10/20 0346 01/10/20 1212 01/10/20 1811 01/11/20 0312 01/12/20 0213  NA 132*   < >  --  137  --  137 130*  --  137 142  K 4.1   < >  --  3.8  --  3.7 3.5  --  3.5 4.3  CL 95*  --   --  99  --  99  --   --  103 106  CO2 27  --   --  29  --  25  --   --  26 29  GLUCOSE 357*  --   --  319*  --  412*  --   --  271* 207*  BUN 15  --   --  17  --  43*  --   --  54* 70*  CREATININE 0.74   < > 0.70 0.74  --  1.61*  --   --  1.38* 1.24*  CALCIUM 8.0*  --   --  8.0*  --  7.6*  --   --  6.5* 6.9*  MG  --   --   --   --  2.5* 2.3  --  2.1 2.2  --   PHOS  --   --   --   --  2.6 4.0  --  5.1* 5.2*  --    < > = values in this interval not displayed.   GFR: Estimated Creatinine Clearance: 69.8 mL/min (A) (by C-G formula based on SCr of 1.24 mg/dL (H)). Recent Labs  Lab 01/17/2020 1025 01/16/2020 1317 01/31/2020 2231 01/09/20 0036 01/09/20 1230 01/10/20 0346 01/11/20 0312 01/12/20 0213  PROCALCITON 0.60  --   --   --  1.23 2.34 0.98  --   WBC 4.2  --    < > 4.3  --  7.0 5.9 6.2  LATICACIDVEN 2.6* 2.1*  --   --   --   --   --   --    < > = values in this interval not displayed.    Liver Function Tests: Recent Labs  Lab 02/05/2020 1025 01/09/20 0036 01/10/20 0346 01/11/20 0312 01/12/20 0213  AST 79* 72* 56* 67* 90*  ALT 46* 43 37 37 53*  ALKPHOS 114 115 113 94 94  BILITOT 0.9 1.0 0.8 0.6 0.5  PROT 6.4* 6.5 5.7* 5.2* 5.1*  ALBUMIN 3.1* 3.1* 2.7* 2.6*  2.7*   No results for input(s): LIPASE, AMYLASE in the last 168 hours. No results for input(s): AMMONIA in the last 168 hours.  ABG    Component Value  Date/Time   PHART 7.255 (L) 01/11/2020 1203   PCO2ART 60.2 (H) 01/11/2020 1203   PO2ART 141 (H) 01/11/2020 1203   HCO3 25.8 01/11/2020 1203   TCO2 26 01/10/2020 1212   ACIDBASEDEF 2.0 01/11/2020 1203   O2SAT 98.2 01/11/2020 1203     Coagulation Profile: Recent Labs  Lab 01/06/20 1749  INR 1.0    Cardiac Enzymes: No results for input(s): CKTOTAL, CKMB, CKMBINDEX, TROPONINI in the last 168 hours.  HbA1C: Hgb A1c MFr Bld  Date/Time Value Ref Range Status  09/04/2019 03:22 PM 9.9 (H) 4.6 - 6.5 % Final    Comment:    Glycemic Control Guidelines for People with Diabetes:Non Diabetic:  <6%Goal of Therapy: <7%Additional Action Suggested:  >8%   04/17/2019 03:56 PM 9.6 (H) 4.6 - 6.5 % Final    Comment:    Glycemic Control Guidelines for People with Diabetes:Non Diabetic:  <6%Goal of Therapy: <7%Additional Action Suggested:  >8%     CBG: Recent Labs  Lab 01/12/20 0224 01/12/20 0327 01/12/20 0425 01/12/20 0548 01/12/20 0651  GLUCAP 201* 191* 178* 161* 160*     Critical care time: 40 minutes    Roselie Awkward, MD [place] [facility] Pager: [phone] Cell: [phone] If no response, call [phone]

## 2020-01-12 NOTE — Progress Notes (Signed)
Nutrition Follow-up  DOCUMENTATION CODES:   Morbid obesity  INTERVENTION:  - will adjust TF regimen: Vital 1.5 @ 20 ml/hr to advance by 10 ml every 12 hours to reach goal rate of 60 ml/hr with 90 ml Prosource TF BID. - at goal rate, this regimen will provide 2320 kcal, 141 grams protein, and 1100 ml free water.  - free water flush to continue to be per CCM.    NUTRITION DIAGNOSIS:   Increased nutrient needs related to acute illness, catabolic illness (KJZPH-15 infection) as evidenced by estimated needs. -ongoing  GOAL:   Provide needs based on ASPEN/SCCM guidelines -to be met with TF regimen  MONITOR:   Vent status, TF tolerance, Labs, Weight trends  ASSESSMENT:   54 y.o. female with medical history of type 2 DM and obesity. She presented to the ED at [facility] with SOB diarrhea x1 week, and cough. She was dx with COVID-19 one week prior to presentation to the ED.  Significant Events: 8/2- admission 8/3- intubated; OGT placed; TF initiated  8/4- initial RD assessment; OGT removed and NGT placed in L nare (tip in distal stomach); proned 8/5- unproned at ~0955   Patient remains intubated with NGT in place (gastric). She is receiving Vital High Protein at goal rate of 55 ml/hr with 45 ml ProSource TF TID and 200 ml free water every 4 hours. This regimen is providing 1440 kcal, 148 grams protein, and 2303 ml free water.   Able to complete NFPE today. Weight on admission appears to be a stated weight. Weight is +11.1 kg/24 lb compared today compared to that weight. Re-estimated kcal need based on adjusted body weight.  Able to talk with RN earlier today who reported patient tolerating TF without issue. Propofol now off. Patient was flipped from supine to prone position a few minutes ago. Will adjust TF regimen as outlined above.    Patient is currently intubated on ventilator support MV: 9.4 L/min Temp (24hrs), Avg:98.2 F (36.8 C), Min:97.7 F (36.5 C), Max:98.4  F (36.9 C) Propofol: none  Labs reviewed; CBGs: 133-201 mg/dl, BUN: 70 mg/dl, creatinine: 1.24 mg/dl, Ca: 6.9 mg/dl, LFTs elevated, GFR: 49 ml/min.  Medications reviewed; 500 mg ascorbic acid/day, 20 mg IV pepcid BID, 40 mg IV lasix x2 doses on 8/6, 10 mg novolog every 4 hours, sliding scale novolog, 31 units levemir BID, 5 mg tradjenta/day, 40 mg solu-medrol/day, 220 mg zinc sulfate/day.  IVF; D10 @ 40 ml/hr (326 kcal). Drips; nimbex @ 3 mcg/kg/min, fentanyl @ 275 mcg/hr, insulin @ 8 units/hr, versed @ 7 mg/hr.    NUTRITION - FOCUSED PHYSICAL EXAM:  completed; no muscle or fat depletion; mild all over edema  Diet Order:   Diet Order    None      EDUCATION NEEDS:   No education needs have been identified at this time  Skin:  Skin Assessment: Reviewed RN Assessment (MASD to abdomen and perineum)  Last BM:  8/3  Height:   Ht Readings from Last 1 Encounters:  01/09/20 5' 4"  (1.626 m)    Weight:   Wt Readings from Last 1 Encounters:  01/12/20 133.6 kg     Estimated Nutritional Needs:  Kcal:  2344 kcal Protein:  >/= 136 grams (2.5 grams/kg IBW) Fluid:  >/= 2.5 L/day     Jarome Matin, MS, RD, LDN, CNSC Inpatient Clinical Dietitian RD pager # available in [facility]  After hours/weekend pager # available in [facility]

## 2020-01-12 NOTE — Progress Notes (Signed)
Patient proned at this time. ETT taped @ 24 @ lip. Mild desaturation during proning, but recovered to 91%. RT will continue to monitor patient.

## 2020-01-13 ENCOUNTER — Inpatient Hospital Stay (HOSPITAL_COMMUNITY): Payer: PRIVATE HEALTH INSURANCE

## 2020-01-13 DIAGNOSIS — R739 Hyperglycemia, unspecified: Secondary | ICD-10-CM

## 2020-01-13 DIAGNOSIS — N179 Acute kidney failure, unspecified: Secondary | ICD-10-CM

## 2020-01-13 DIAGNOSIS — Z9911 Dependence on respirator [ventilator] status: Secondary | ICD-10-CM

## 2020-01-13 LAB — CULTURE, BLOOD (ROUTINE X 2)
Culture: NO GROWTH
Culture: NO GROWTH
Special Requests: ADEQUATE

## 2020-01-13 LAB — CBC WITH DIFFERENTIAL/PLATELET
Abs Immature Granulocytes: 0.12 10*3/uL — ABNORMAL HIGH (ref 0.00–0.07)
Basophils Absolute: 0 10*3/uL (ref 0.0–0.1)
Basophils Relative: 0 %
Eosinophils Absolute: 0 10*3/uL (ref 0.0–0.5)
Eosinophils Relative: 0 %
HCT: 42.4 % (ref 36.0–46.0)
Hemoglobin: 12.8 g/dL (ref 12.0–15.0)
Immature Granulocytes: 2 %
Lymphocytes Relative: 15 %
Lymphs Abs: 0.9 10*3/uL (ref 0.7–4.0)
MCH: 28.4 pg (ref 26.0–34.0)
MCHC: 30.2 g/dL (ref 30.0–36.0)
MCV: 94.2 fL (ref 80.0–100.0)
Monocytes Absolute: 0.3 10*3/uL (ref 0.1–1.0)
Monocytes Relative: 5 %
Neutro Abs: 4.9 10*3/uL (ref 1.7–7.7)
Neutrophils Relative %: 78 %
Platelets: 132 10*3/uL — ABNORMAL LOW (ref 150–400)
RBC: 4.5 MIL/uL (ref 3.87–5.11)
RDW: 13.7 % (ref 11.5–15.5)
WBC: 6.2 10*3/uL (ref 4.0–10.5)
nRBC: 0.3 % — ABNORMAL HIGH (ref 0.0–0.2)

## 2020-01-13 LAB — GLUCOSE, CAPILLARY
Glucose-Capillary: 238 mg/dL — ABNORMAL HIGH (ref 70–99)
Glucose-Capillary: 244 mg/dL — ABNORMAL HIGH (ref 70–99)
Glucose-Capillary: 255 mg/dL — ABNORMAL HIGH (ref 70–99)
Glucose-Capillary: 287 mg/dL — ABNORMAL HIGH (ref 70–99)
Glucose-Capillary: 306 mg/dL — ABNORMAL HIGH (ref 70–99)
Glucose-Capillary: 321 mg/dL — ABNORMAL HIGH (ref 70–99)
Glucose-Capillary: 338 mg/dL — ABNORMAL HIGH (ref 70–99)
Glucose-Capillary: 347 mg/dL — ABNORMAL HIGH (ref 70–99)

## 2020-01-13 LAB — BLOOD GAS, ARTERIAL
Acid-Base Excess: 1.2 mmol/L (ref 0.0–2.0)
Bicarbonate: 30.4 mmol/L — ABNORMAL HIGH (ref 20.0–28.0)
Drawn by: 29853
FIO2: 70
MECHVT: 330 mL
O2 Saturation: 87.1 %
PEEP: 16 cmH2O
Patient temperature: 98.6
RATE: 30 resp/min
pCO2 arterial: 73.8 mmHg (ref 32.0–48.0)
pH, Arterial: 7.238 — ABNORMAL LOW (ref 7.350–7.450)
pO2, Arterial: 58.1 mmHg — ABNORMAL LOW (ref 83.0–108.0)

## 2020-01-13 LAB — COMPREHENSIVE METABOLIC PANEL
ALT: 58 U/L — ABNORMAL HIGH (ref 0–44)
AST: 71 U/L — ABNORMAL HIGH (ref 15–41)
Albumin: 2.6 g/dL — ABNORMAL LOW (ref 3.5–5.0)
Alkaline Phosphatase: 93 U/L (ref 38–126)
Anion gap: 9 (ref 5–15)
BUN: 77 mg/dL — ABNORMAL HIGH (ref 6–20)
CO2: 28 mmol/L (ref 22–32)
Calcium: 7 mg/dL — ABNORMAL LOW (ref 8.9–10.3)
Chloride: 108 mmol/L (ref 98–111)
Creatinine, Ser: 0.99 mg/dL (ref 0.44–1.00)
GFR calc Af Amer: >60 mL/min (ref >60)
GFR calc non Af Amer: >60 mL/min (ref >60)
Glucose, Bld: 259 mg/dL — ABNORMAL HIGH (ref 70–99)
Potassium: 3.8 mmol/L (ref 3.5–5.1)
Sodium: 145 mmol/L (ref 135–145)
Total Bilirubin: 0.4 mg/dL (ref 0.3–1.2)
Total Protein: 5.3 g/dL — ABNORMAL LOW (ref 6.5–8.1)

## 2020-01-13 LAB — SEDIMENTATION RATE: Sed Rate: 10 mm/hr (ref 0–22)

## 2020-01-13 LAB — C-REACTIVE PROTEIN: CRP: 1.5 mg/dL — ABNORMAL HIGH (ref <1.0)

## 2020-01-13 LAB — FIBRINOGEN: Fibrinogen: 321 mg/dL (ref 210–475)

## 2020-01-13 LAB — FERRITIN: Ferritin: 561 ng/mL — ABNORMAL HIGH (ref 11–307)

## 2020-01-13 LAB — D-DIMER, QUANTITATIVE: D-Dimer, Quant: 5.82 ug/mL-FEU — ABNORMAL HIGH (ref 0.00–0.50)

## 2020-01-13 LAB — LACTATE DEHYDROGENASE: LDH: 602 U/L — ABNORMAL HIGH (ref 98–192)

## 2020-01-13 MED ORDER — INSULIN ASPART 100 UNIT/ML ~~LOC~~ SOLN
3.0000 [IU] | SUBCUTANEOUS | Status: DC
  Administered 2020-01-13: 6 [IU] via SUBCUTANEOUS
  Administered 2020-01-13: 9 [IU] via SUBCUTANEOUS
  Administered 2020-01-13: 6 [IU] via SUBCUTANEOUS

## 2020-01-13 MED ORDER — SODIUM CHLORIDE 0.9 % IV SOLN: INTRAVENOUS | Status: DC

## 2020-01-13 MED ORDER — DEXTROSE-NACL 5-0.45 % IV SOLN: INTRAVENOUS | Status: DC

## 2020-01-13 MED ORDER — INSULIN REGULAR(HUMAN) IN NACL 100-0.9 UT/100ML-% IV SOLN
INTRAVENOUS | Status: DC
  Administered 2020-01-13: 19 [IU]/h via INTRAVENOUS
  Administered 2020-01-14: 9.5 [IU]/h via INTRAVENOUS
  Filled 2020-01-13: qty 100

## 2020-01-13 MED ORDER — INSULIN ASPART 100 UNIT/ML ~~LOC~~ SOLN: 3.0000 [IU] | SUBCUTANEOUS | Status: DC

## 2020-01-13 MED ORDER — INSULIN DETEMIR 100 UNIT/ML ~~LOC~~ SOLN
31.0000 [IU] | Freq: Two times a day (BID) | SUBCUTANEOUS | Status: DC
  Administered 2020-01-13: 31 [IU] via SUBCUTANEOUS
  Filled 2020-01-13 (×4): qty 0.31

## 2020-01-13 MED ORDER — INSULIN REGULAR(HUMAN) IN NACL 100-0.9 UT/100ML-% IV SOLN: INTRAVENOUS | Status: DC

## 2020-01-13 MED ORDER — DEXTROSE 50 % IV SOLN: 0.0000 mL | INTRAVENOUS | Status: DC | PRN

## 2020-01-13 MED ORDER — FAMOTIDINE 40 MG/5ML PO SUSR
20.0000 mg | Freq: Two times a day (BID) | ORAL | Status: DC
  Administered 2020-01-13 – 2020-01-15 (×5): 20 mg
  Filled 2020-01-13 (×7): qty 2.5

## 2020-01-13 MED ORDER — INSULIN ASPART 100 UNIT/ML ~~LOC~~ SOLN
10.0000 [IU] | SUBCUTANEOUS | Status: DC
  Administered 2020-01-13 (×3): 10 [IU] via SUBCUTANEOUS

## 2020-01-13 MED ORDER — FUROSEMIDE 10 MG/ML IJ SOLN
40.0000 mg | Freq: Four times a day (QID) | INTRAMUSCULAR | Status: AC
  Administered 2020-01-13 – 2020-01-14 (×4): 40 mg via INTRAVENOUS
  Filled 2020-01-13 (×4): qty 4

## 2020-01-13 NOTE — Progress Notes (Signed)
[facility] Progress Note Patient Name: Alisha Stephenson DOB: Feb 28, 1966 MRN: [id number]   Date of Service  01/13/2020  HPI/Events of Note  Glucose continues to trend up from 200s now at 300s. On systemic steroids. Patient seen proned.  eICU Interventions  Resume insulin drip     Intervention Category Major Interventions: Hyperglycemia - active titration of insulin therapy  Judd Lien 01/13/2020, 8:31 PM

## 2020-01-13 NOTE — Progress Notes (Signed)
eLink Physician-Brief Progress Note Patient Name: TIJA BISS DOB: 1965-12-23 MRN: [id number]   Date of Service  01/13/2020  HPI/Events of Note  Patient with persistent marked hyperglycemia.  eICU Interventions  Insulin infusion ordered.        Kerry Kass Tamaria Dunleavy 01/13/2020, 4:16 AM

## 2020-01-13 NOTE — Progress Notes (Signed)
NAME:  Alisha Stephenson, MRN:  [id number], DOB:  February 26, 1966, LOS: 5 ADMISSION DATE:  01/12/2020, CONSULTATION DATE:  8/3 REFERRING MD:  Alfredia Ferguson, CHIEF COMPLAINT:  Dyspnea   Brief History   54 y/o female admitted on 8/2 with severe acute respiratory failure with hypoxemia due to COVID 19 pneumonia.  She developed symptoms 1 week prior to admission.  Past Medical History  DM2 Diverticulitis Gallstones Ovarian cyst NAFLD Asthma  Significant Hospital Events   8/2 admit 8/3 ICU transfer, intubated 8/4 prone, paralyze   Consults:  PCCM  Procedures:  8/3 ETT >  8/3 PICC >   Significant Diagnostic Tests:  7/31 CT head > NAICP 7/31 MRI/MRA brain > no acute changes, possibly small aneurysm ACOM  Micro Data:  8/2 blood >  8/2 SARS COV 2 > positive 8/4 resp >  8/4 urine >   Antimicrobials:  8/2 remdesivir >  8/2 actemra >  8/2 solumedrol >   8/3 ceftriaxone >  8/3 azithro >   Interim history/subjective:  Increasing oxygen requirements since supine this morning.  Objective   Blood pressure (!) 147/57, pulse (!) 107, temperature 98.1 F (36.7 C), resp. rate (!) 30, height 5\' 4"  (1.626 m), weight 133.6 kg, SpO2 94 %.    Vent Mode: PRVC FiO2 (%):  [70 %-100 %] 70 % Set Rate:  [30 bmp] 30 bmp Vt Set:  [330 mL] 330 mL PEEP:  [16 cmH20] 16 cmH20 Plateau Pressure:  [29 cmH20-31 cmH20] 31 cmH20   Intake/Output Summary (Last 24 hours) at 01/13/2020 1127 Last data filed at 01/13/2020 0700 Gross per 24 hour  Intake 1056.54 ml  Output 1700 ml  Net -643.46 ml   Filed Weights   01/15/2020 1004 01/12/20 0500 01/13/20 0500  Weight: (!) 122.5 kg 133.6 kg 133.6 kg    Examination:  General: Critically ill-appearing woman lying in bed intubated, heavily sedated HENT: [place]/AT, eyes anicteric PULM: faint rales bilaterally, no significant secretions from ET tube.  Synchronous with the ventilator. CV: tachycardic, regular rhythm GI: Soft, nontender, nondistended MSK: Appropriate  muscle tone for age Neuro: RASS -5, not withdrawing to painful stimulation.  Breathing above the set rate on the vent.  PERRL.  CXR images 8/7> improving aeration bilaterally, lateral left lower lobe opacity. ETT 2 cm above carina.  Resolved Hospital Problem list     Assessment & Plan:   ARDS due to COVID 19 pneumonia > worsening oxygenation again on 8/6, bilateral effusions, presumed acute pulmonary edema -Continue low tidal volume ventilation, 4-8 cc/kg ideal body weight with goal plateau's and 30 driving pressure less than 15.  Current-plateau 34, driving pressure 16. Vt decreased and increased PEEP.  Permissive hypercapnia. Goal net negative fluid balance -Continue proning 16 h/day until P: F >150.  -Continue NMB  -Lasix x3 doses today -Continue Solu-Medrol per protocol; previously completed remdesivir per protocol.  Tocilizumab given on 8/2. -Goal RASS -4 to -5 -VAP prevention protocol -Continue to monitor off antibiotics; no clinical evidence of bacterial pneumonia  Need for sedation for mechanical ventilation Ventilator dyssynchrony  -Continue heavy sedation change to intermittent neuromuscular blockade protocol Continue fentanyl/versed infusion Continue oxycodone and clonazepam  AKI> improved -Continue to monitor -goal of euvolemia -Electrolyte repletion as needed -Will continue to require Foley catheter until no longer requiring prone ventilation.  Hyperglycemia > not controled on endotool -Previously not controlled on Endo tool, placed back on insulin infusion overnight. -Goal BG 140-180 while admitted to the ICU -Con't linagliptin  Best practice:  Diet:  tube feeding Pain/Anxiety/Delirium protocol (if indicated): as above VAP protocol (if indicated): yes DVT prophylaxis: lovenox GI prophylaxis: famotidine Glucose control: Basal bolus insulin Mobility: bed rest Code Status: full Family Communication: updated [name] (husband) by FaceTime during evaluation  8/7 Disposition: ICU  Labs   CBC: Recent Labs  Lab 01/09/20 0036 01/09/20 0036 01/10/20 0346 01/10/20 1212 01/11/20 0312 01/12/20 0213 01/13/20 0500  WBC 4.3  --  7.0  --  5.9 6.2 6.2  NEUTROABS 3.5  --  6.0  --  4.9 4.9 4.9  HGB 15.1*   < > 13.7 12.6 12.9 12.6 12.8  HCT 46.2*   < > 44.5 37.0 40.1 40.0 42.4  MCV 88.5  --  93.3  --  91.6 93.0 94.2  PLT 110*  --  168  --  142* 147* 132*   < > = values in this interval not displayed.    Basic Metabolic Panel: Recent Labs  Lab 01/09/20 0036 01/09/20 1230 01/10/20 0346 01/10/20 0346 01/10/20 1212 01/10/20 1811 01/11/20 0312 01/12/20 0213 01/12/20 1002 01/12/20 1356 01/13/20 0500  NA   < >  --  137   < >   < >  --  137 142 142 143 145  K   < >  --  3.7   < >   < >  --  3.5 4.3 4.1 4.2 3.8  CL   < >  --  99   < >  --   --  103 106 107 106 108  CO2   < >  --  25   < >  --   --  26 29 27 26 28   GLUCOSE   < >  --  412*   < >  --   --  271* 207* 153* 205* 259*  BUN   < >  --  43*   < >  --   --  54* 70* 68* 71* 77*  CREATININE   < >  --  1.61*   < >  --   --  1.38* 1.24* 1.04* 1.09* 0.99  CALCIUM   < >  --  7.6*   < >  --   --  6.5* 6.9* 6.8* 6.9* 7.0*  MG  --  2.5* 2.3  --   --  2.1 2.2  --   --   --   --   PHOS  --  2.6 4.0  --   --  5.1* 5.2*  --   --   --   --    < > = values in this interval not displayed.   GFR: Estimated Creatinine Clearance: 87.5 mL/min (by C-G formula based on SCr of 0.99 mg/dL). Recent Labs  Lab 02/01/2020 1025 02/01/2020 1317 01/22/2020 2231 01/09/20 0036 01/09/20 1230 01/10/20 0346 01/11/20 0312 01/12/20 0213 01/13/20 0500  PROCALCITON 0.60  --   --   --  1.23 2.34 0.98  --   --   WBC 4.2  --    < >   < >  --  7.0 5.9 6.2 6.2  LATICACIDVEN 2.6* 2.1*  --   --   --   --   --   --   --    < > = values in this interval not displayed.    Liver Function Tests: Recent Labs  Lab 01/09/20 0036 01/10/20 0346 01/11/20 0312 01/12/20 0213 01/13/20 0500  AST 72* 56* 67* 90* 71*  ALT 43 37  37  53* 58*  ALKPHOS 115 113 94 94 93  BILITOT 1.0 0.8 0.6 0.5 0.4  PROT 6.5 5.7* 5.2* 5.1* 5.3*  ALBUMIN 3.1* 2.7* 2.6* 2.7* 2.6*   No results for input(s): LIPASE, AMYLASE in the last 168 hours. No results for input(s): AMMONIA in the last 168 hours.  ABG    Component Value Date/Time   PHART 7.238 (L) 01/13/2020 0750   PCO2ART 73.8 (HH) 01/13/2020 0750   PO2ART 58.1 (L) 01/13/2020 0750   HCO3 30.4 (H) 01/13/2020 0750   TCO2 26 01/10/2020 1212   ACIDBASEDEF 2.0 01/11/2020 1203   O2SAT 87.1 01/13/2020 0750     Coagulation Profile: Recent Labs  Lab 01/06/20 1749  INR 1.0    Cardiac Enzymes: No results for input(s): CKTOTAL, CKMB, CKMBINDEX, TROPONINI in the last 168 hours.  HbA1C: Hgb A1c MFr Bld  Date/Time Value Ref Range Status  09/04/2019 03:22 PM 9.9 (H) 4.6 - 6.5 % Final    Comment:    Glycemic Control Guidelines for People with Diabetes:Non Diabetic:  <6%Goal of Therapy: <7%Additional Action Suggested:  >8%   04/17/2019 03:56 PM 9.6 (H) 4.6 - 6.5 % Final    Comment:    Glycemic Control Guidelines for People with Diabetes:Non Diabetic:  <6%Goal of Therapy: <7%Additional Action Suggested:  >8%     CBG: Recent Labs  Lab 01/12/20 1510 01/12/20 2100 01/12/20 2317 01/13/20 0134 01/13/20 0330  GLUCAP 178* 308* 339* 287* 238*     This patient is critically ill with multiple organ system failure which requires frequent high complexity decision making, assessment, support, evaluation, and titration of therapies. This was completed through the application of advanced monitoring technologies and extensive interpretation of multiple databases. During this encounter critical care time was devoted to patient care services described in this note for 35 minutes.  Julian Hy, DO 01/13/20 12:53 PM [facility]

## 2020-01-14 LAB — BLOOD GAS, ARTERIAL
Acid-Base Excess: 5.9 mmol/L — ABNORMAL HIGH (ref 0.0–2.0)
Bicarbonate: 34.4 mmol/L — ABNORMAL HIGH (ref 20.0–28.0)
FIO2: 90
O2 Saturation: 96.9 %
Patient temperature: 98.6
pCO2 arterial: 71.7 mmHg (ref 32.0–48.0)
pH, Arterial: 7.302 — ABNORMAL LOW (ref 7.350–7.450)
pO2, Arterial: 93.5 mmHg (ref 83.0–108.0)

## 2020-01-14 LAB — GLUCOSE, CAPILLARY
Glucose-Capillary: 274 mg/dL — ABNORMAL HIGH (ref 70–99)
Glucose-Capillary: 197 mg/dL — ABNORMAL HIGH (ref 70–99)
Glucose-Capillary: 151 mg/dL — ABNORMAL HIGH (ref 70–99)
Glucose-Capillary: 180 mg/dL — ABNORMAL HIGH (ref 70–99)
Glucose-Capillary: 169 mg/dL — ABNORMAL HIGH (ref 70–99)
Glucose-Capillary: 173 mg/dL — ABNORMAL HIGH (ref 70–99)
Glucose-Capillary: 118 mg/dL — ABNORMAL HIGH (ref 70–99)
Glucose-Capillary: 132 mg/dL — ABNORMAL HIGH (ref 70–99)
Glucose-Capillary: 161 mg/dL — ABNORMAL HIGH (ref 70–99)
Glucose-Capillary: 177 mg/dL — ABNORMAL HIGH (ref 70–99)
Glucose-Capillary: 185 mg/dL — ABNORMAL HIGH (ref 70–99)
Glucose-Capillary: 191 mg/dL — ABNORMAL HIGH (ref 70–99)
Glucose-Capillary: 187 mg/dL — ABNORMAL HIGH (ref 70–99)
Glucose-Capillary: 274 mg/dL — ABNORMAL HIGH (ref 70–99)

## 2020-01-14 LAB — BASIC METABOLIC PANEL
Anion gap: 9 (ref 5–15)
BUN: 69 mg/dL — ABNORMAL HIGH (ref 6–20)
CO2: 32 mmol/L (ref 22–32)
Calcium: 7.5 mg/dL — ABNORMAL LOW (ref 8.9–10.3)
Chloride: 101 mmol/L (ref 98–111)
Creatinine, Ser: 0.73 mg/dL (ref 0.44–1.00)
GFR calc Af Amer: >60 mL/min (ref >60)
GFR calc non Af Amer: >60 mL/min (ref >60)
Glucose, Bld: 145 mg/dL — ABNORMAL HIGH (ref 70–99)
Potassium: 3.6 mmol/L (ref 3.5–5.1)
Sodium: 142 mmol/L (ref 135–145)

## 2020-01-14 LAB — C-REACTIVE PROTEIN: CRP: 0.8 mg/dL (ref <1.0)

## 2020-01-14 LAB — FERRITIN: Ferritin: 610 ng/mL — ABNORMAL HIGH (ref 11–307)

## 2020-01-14 LAB — FIBRINOGEN: Fibrinogen: 327 mg/dL (ref 210–475)

## 2020-01-14 LAB — SEDIMENTATION RATE: Sed Rate: 12 mm/hr (ref 0–22)

## 2020-01-14 LAB — LACTATE DEHYDROGENASE: LDH: 608 U/L — ABNORMAL HIGH (ref 98–192)

## 2020-01-14 LAB — D-DIMER, QUANTITATIVE: D-Dimer, Quant: 11.22 ug/mL-FEU — ABNORMAL HIGH (ref 0.00–0.50)

## 2020-01-14 MED ORDER — SENNA 8.6 MG PO TABS: 1.0000 | ORAL_TABLET | Freq: Every day | ORAL | Status: DC

## 2020-01-14 MED ORDER — STERILE WATER FOR INJECTION IJ SOLN
INTRAMUSCULAR | Status: AC
  Administered 2020-01-14: 10 mL
  Filled 2020-01-14: qty 10

## 2020-01-14 MED ORDER — FUROSEMIDE 10 MG/ML IJ SOLN: 40.0000 mg | Freq: Four times a day (QID) | INTRAMUSCULAR | Status: DC

## 2020-01-14 MED ORDER — INSULIN ASPART 100 UNIT/ML ~~LOC~~ SOLN
3.0000 [IU] | SUBCUTANEOUS | Status: DC
  Administered 2020-01-14: 6 [IU] via SUBCUTANEOUS
  Administered 2020-01-14 – 2020-01-15 (×2): 9 [IU] via SUBCUTANEOUS

## 2020-01-14 MED ORDER — STERILE WATER FOR INJECTION IJ SOLN
INTRAMUSCULAR | Status: AC
  Filled 2020-01-14: qty 10

## 2020-01-14 MED ORDER — DEXTROSE 10 % IV SOLN: INTRAVENOUS | Status: DC

## 2020-01-14 MED ORDER — POTASSIUM CHLORIDE 20 MEQ/15ML (10%) PO SOLN
40.0000 meq | Freq: Once | ORAL | Status: AC
  Administered 2020-01-14: 40 meq
  Filled 2020-01-14: qty 30

## 2020-01-14 MED ORDER — INSULIN DETEMIR 100 UNIT/ML ~~LOC~~ SOLN
31.0000 [IU] | Freq: Once | SUBCUTANEOUS | Status: AC
  Administered 2020-01-14: 31 [IU] via SUBCUTANEOUS
  Filled 2020-01-14: qty 0.31

## 2020-01-14 MED ORDER — SENNOSIDES 8.8 MG/5ML PO SYRP
5.0000 mL | ORAL_SOLUTION | Freq: Every day | ORAL | Status: DC
  Administered 2020-01-14 – 2020-01-17 (×4): 5 mL
  Filled 2020-01-14 (×4): qty 5

## 2020-01-14 MED ORDER — INSULIN ASPART 100 UNIT/ML ~~LOC~~ SOLN
3.0000 [IU] | SUBCUTANEOUS | Status: DC
  Administered 2020-01-14 – 2020-01-15 (×4): 3 [IU] via SUBCUTANEOUS

## 2020-01-14 MED ORDER — FUROSEMIDE 10 MG/ML IJ SOLN
40.0000 mg | Freq: Four times a day (QID) | INTRAMUSCULAR | Status: AC
  Administered 2020-01-14 (×2): 40 mg via INTRAVENOUS
  Filled 2020-01-14 (×2): qty 4

## 2020-01-14 MED ORDER — POLYETHYLENE GLYCOL 3350 17 G PO PACK
17.0000 g | PACK | Freq: Every day | ORAL | Status: DC
  Administered 2020-01-14 – 2020-03-04 (×32): 17 g
  Filled 2020-01-14 (×37): qty 1

## 2020-01-14 MED ORDER — POTASSIUM CHLORIDE 10 MEQ/50ML IV SOLN
10.0000 meq | INTRAVENOUS | Status: AC
  Administered 2020-01-14 (×4): 10 meq via INTRAVENOUS
  Filled 2020-01-14 (×4): qty 50

## 2020-01-14 NOTE — Progress Notes (Signed)
Spoke with Dr. [name] about insulin drip. Ordered to give previously ordered dose of Levemir and discontinue insulin drip two hours after. Will continue to monitor.

## 2020-01-14 NOTE — Progress Notes (Signed)
NAME:  Alisha Stephenson, MRN:  [id number], DOB:  1965/08/14, LOS: 6 ADMISSION DATE:  01/18/2020, CONSULTATION DATE:  8/3 REFERRING MD:  Alfredia Ferguson, CHIEF COMPLAINT:  Dyspnea   Brief History   54 y/o female admitted on 8/2 with severe acute respiratory failure with hypoxemia due to COVID 19 pneumonia.  She developed symptoms 1 week prior to admission.  Past Medical History  DM2 Diverticulitis Gallstones Ovarian cyst NAFLD Asthma  Significant Hospital Events   8/2 admit 8/3 ICU transfer, intubated 8/4 prone, paralyze  Consults:  PCCM  Procedures:  8/3 ETT >  8/3 PICC >   Significant Diagnostic Tests:  7/31 CT head > NAICP 7/31 MRI/MRA brain > no acute changes, possibly small aneurysm ACOM  Micro Data:  8/2 blood > NG 8/2 SARS COV 2 > positive 8/4 resp > negative 8/4 urine >   Antimicrobials:  8/2 remdesivir >  8/2 actemra >  8/2 solumedrol >   8/3 ceftriaxone >  8/5 8/3 azithro >  8/5  Interim history/subjective:  Remained proned overnight.  Objective   Blood pressure (!) 164/67, pulse 88, temperature 98.4 F (36.9 C), resp. rate (!) 32, height 5\' 4"  (1.626 m), weight 130.1 kg, SpO2 94 %.    Vent Mode: PRVC FiO2 (%):  [90 %-100 %] 90 % Set Rate:  [18 bmp-32 bmp] 32 bmp Vt Set:  [320 mL-360 mL] 360 mL PEEP:  [18 cmH20] 18 cmH20 Plateau Pressure:  [25 cmH20-32 cmH20] 31 cmH20   Intake/Output Summary (Last 24 hours) at 01/14/2020 0758 Last data filed at 01/14/2020 0600 Gross per 24 hour  Intake 1848.36 ml  Output 3375 ml  Net -1526.64 ml   Filed Weights   01/12/20 0500 01/13/20 0500 01/14/20 0500  Weight: 133.6 kg 133.6 kg 130.1 kg    Examination:  General: Critically ill-appearing woman lying in bed intubated, sedated, proned HENT: [place]/AT, left eye anicteric PULM: Vent dyssynchrony with plateau pressure 32, driving pressure 14.  CTAB.  No significant tracheal secretions. CV: Sinus tachycardia on telemetry GI: Soft, obese, nondistended MSK: Appropriate  muscle tone for age, pitting edema bilateral lower extremities Neuro: RASS -5, mild vent dyssynchrony, but patient receiving as needed NMB.  Not withdrawing from pain.  Left pupil reactive.   Resolved Hospital Problem list     Assessment & Plan:   ARDS due to COVID 19 pneumonia > worsening oxygenation again on 8/6, bilateral effusions, presumed acute pulmonary edema -Continue low tidal volume ventilation.  Plateau is mildly elevated with driving pressure at goal.  Permissive hypercapnia. -Goal net negative fluid balance-Lasix x3 doses today. -Continue proning 16 h/day until P: F >150.  -Continue NMB PRN for significant dyssynchrony causing desaturation or other hemodynamic instability. -Continue Solu-Medrol per protocol; previously completed remdesivir per protocol.  Tocilizumab given on 8/2. -Goal RASS -4 to -5 -VAP prevention protocol -Continue to monitor off antibiotics; no clinical evidence of bacterial pneumonia  Need for sedation for mechanical ventilation Ventilator dyssynchrony  -Continue heavy sedation to facilitate intermittent neuromuscular blockade  -Continue fentanyl/versed infusion -Continue oxycodone and clonazepam  AKI> improved -Continue to monitor daily -goal of euvolemia -Electrolyte repletion as needed -Will continue to require Foley catheter until no longer requiring prone ventilation.  Hyperglycemia > not controlled -Goal BG 140-180 while admitted to the ICU -Con't linagliptin -Restart basal bolus insulin and discontinue individual 2 hours after Levemir  Best practice:  Diet: tube feeding Pain/Anxiety/Delirium protocol (if indicated): as above VAP protocol (if indicated): yes DVT prophylaxis: lovenox GI prophylaxis:  famotidine Glucose control: Basal bolus insulin Mobility: bed rest Code Status: full Family Communication: updated [name] (husband) by FaceTime during evaluation 8/8 Disposition: ICU  Labs   CBC: Recent Labs  Lab 01/09/20 0036  01/09/20 0036 01/10/20 0346 01/10/20 1212 01/11/20 0312 01/12/20 0213 01/13/20 0500  WBC 4.3  --  7.0  --  5.9 6.2 6.2  NEUTROABS 3.5  --  6.0  --  4.9 4.9 4.9  HGB 15.1*   < > 13.7 12.6 12.9 12.6 12.8  HCT 46.2*   < > 44.5 37.0 40.1 40.0 42.4  MCV 88.5  --  93.3  --  91.6 93.0 94.2  PLT 110*  --  168  --  142* 147* 132*   < > = values in this interval not displayed.    Basic Metabolic Panel: Recent Labs  Lab 01/09/20 0036 01/09/20 1230 01/10/20 0346 01/10/20 0346 01/10/20 1212 01/10/20 1811 01/11/20 0312 01/12/20 0213 01/12/20 1002 01/12/20 1356 01/13/20 0500  NA   < >  --  137   < >   < >  --  137 142 142 143 145  K   < >  --  3.7   < >   < >  --  3.5 4.3 4.1 4.2 3.8  CL   < >  --  99   < >  --   --  103 106 107 106 108  CO2   < >  --  25   < >  --   --  26 29 27 26 28   GLUCOSE   < >  --  412*   < >  --   --  271* 207* 153* 205* 259*  BUN   < >  --  43*   < >  --   --  54* 70* 68* 71* 77*  CREATININE   < >  --  1.61*   < >  --   --  1.38* 1.24* 1.04* 1.09* 0.99  CALCIUM   < >  --  7.6*   < >  --   --  6.5* 6.9* 6.8* 6.9* 7.0*  MG  --  2.5* 2.3  --   --  2.1 2.2  --   --   --   --   PHOS  --  2.6 4.0  --   --  5.1* 5.2*  --   --   --   --    < > = values in this interval not displayed.   GFR: Estimated Creatinine Clearance: 86.1 mL/min (by C-G formula based on SCr of 0.99 mg/dL). Recent Labs  Lab 01/25/2020 1025 01/24/2020 1317 02/04/2020 2231 01/09/20 0036 01/09/20 1230 01/10/20 0346 01/11/20 0312 01/12/20 0213 01/13/20 0500  PROCALCITON 0.60  --   --   --  1.23 2.34 0.98  --   --   WBC 4.2  --    < >   < >  --  7.0 5.9 6.2 6.2  LATICACIDVEN 2.6* 2.1*  --   --   --   --   --   --   --    < > = values in this interval not displayed.    Liver Function Tests: Recent Labs  Lab 01/09/20 0036 01/10/20 0346 01/11/20 0312 01/12/20 0213 01/13/20 0500  AST 72* 56* 67* 90* 71*  ALT 43 37 37 53* 58*  ALKPHOS 115 113 94 94 93  BILITOT 1.0 0.8 0.6 0.5 0.4  PROT  6.5 5.7* 5.2* 5.1* 5.3*  ALBUMIN 3.1* 2.7* 2.6* 2.7* 2.6*   No results for input(s): LIPASE, AMYLASE in the last 168 hours. No results for input(s): AMMONIA in the last 168 hours.  ABG    Component Value Date/Time   PHART 7.238 (L) 01/13/2020 0750   PCO2ART 73.8 (HH) 01/13/2020 0750   PO2ART 58.1 (L) 01/13/2020 0750   HCO3 30.4 (H) 01/13/2020 0750   TCO2 26 01/10/2020 1212   ACIDBASEDEF 2.0 01/11/2020 1203   O2SAT 87.1 01/13/2020 0750     Coagulation Profile: No results for input(s): INR, PROTIME in the last 168 hours.  Cardiac Enzymes: No results for input(s): CKTOTAL, CKMB, CKMBINDEX, TROPONINI in the last 168 hours.  HbA1C: Hgb A1c MFr Bld  Date/Time Value Ref Range Status  09/04/2019 03:22 PM 9.9 (H) 4.6 - 6.5 % Final    Comment:    Glycemic Control Guidelines for People with Diabetes:Non Diabetic:  <6%Goal of Therapy: <7%Additional Action Suggested:  >8%   04/17/2019 03:56 PM 9.6 (H) 4.6 - 6.5 % Final    Comment:    Glycemic Control Guidelines for People with Diabetes:Non Diabetic:  <6%Goal of Therapy: <7%Additional Action Suggested:  >8%     CBG: Recent Labs  Lab 01/14/20 0234 01/14/20 0332 01/14/20 0441 01/14/20 0553 01/14/20 0752  GLUCAP 197* 151* 180* 169* 173*     This patient is critically ill with multiple organ system failure which requires frequent high complexity decision making, assessment, support, evaluation, and titration of therapies. This was completed through the application of advanced monitoring technologies and extensive interpretation of multiple databases. During this encounter critical care time was devoted to patient care services described in this note for 32 minutes.  Julian Hy, DO 01/14/20 3:26 PM [facility]

## 2020-01-15 ENCOUNTER — Encounter (HOSPITAL_COMMUNITY): Payer: Self-pay | Admitting: Anesthesiology

## 2020-01-15 ENCOUNTER — Inpatient Hospital Stay (HOSPITAL_COMMUNITY): Payer: PRIVATE HEALTH INSURANCE

## 2020-01-15 ENCOUNTER — Encounter (HOSPITAL_COMMUNITY): Admission: EM | Disposition: E | Payer: Self-pay | Source: Home / Self Care | Attending: Critical Care Medicine

## 2020-01-15 DIAGNOSIS — U071 COVID-19: Secondary | ICD-10-CM

## 2020-01-15 DIAGNOSIS — J9601 Acute respiratory failure with hypoxia: Secondary | ICD-10-CM

## 2020-01-15 DIAGNOSIS — J9600 Acute respiratory failure, unspecified whether with hypoxia or hypercapnia: Secondary | ICD-10-CM

## 2020-01-15 HISTORY — PX: ECMO CANNULATION: CATH118321

## 2020-01-15 LAB — BASIC METABOLIC PANEL
Anion gap: 8 (ref 5–15)
Anion gap: 11 (ref 5–15)
BUN: 73 mg/dL — ABNORMAL HIGH (ref 6–20)
BUN: 71 mg/dL — ABNORMAL HIGH (ref 6–20)
CO2: 32 mmol/L (ref 22–32)
CO2: 31 mmol/L (ref 22–32)
Calcium: 7.6 mg/dL — ABNORMAL LOW (ref 8.9–10.3)
Calcium: 7.7 mg/dL — ABNORMAL LOW (ref 8.9–10.3)
Chloride: 106 mmol/L (ref 98–111)
Chloride: 105 mmol/L (ref 98–111)
Creatinine, Ser: 0.77 mg/dL (ref 0.44–1.00)
Creatinine, Ser: 0.85 mg/dL (ref 0.44–1.00)
GFR calc Af Amer: >60 mL/min (ref >60)
GFR calc Af Amer: >60 mL/min (ref >60)
GFR calc non Af Amer: >60 mL/min (ref >60)
GFR calc non Af Amer: >60 mL/min (ref >60)
Glucose, Bld: 321 mg/dL — ABNORMAL HIGH (ref 70–99)
Glucose, Bld: 376 mg/dL — ABNORMAL HIGH (ref 70–99)
Potassium: 4.8 mmol/L (ref 3.5–5.1)
Potassium: 5.4 mmol/L — ABNORMAL HIGH (ref 3.5–5.1)
Sodium: 146 mmol/L — ABNORMAL HIGH (ref 135–145)
Sodium: 147 mmol/L — ABNORMAL HIGH (ref 135–145)

## 2020-01-15 LAB — POCT I-STAT 7, (LYTES, BLD GAS, ICA,H+H)
Acid-Base Excess: 15 mmol/L — ABNORMAL HIGH (ref 0.0–2.0)
Acid-Base Excess: 16 mmol/L — ABNORMAL HIGH (ref 0.0–2.0)
Bicarbonate: 38.5 mmol/L — ABNORMAL HIGH (ref 20.0–28.0)
Bicarbonate: 40.4 mmol/L — ABNORMAL HIGH (ref 20.0–28.0)
Calcium, Ion: 1.07 mmol/L — ABNORMAL LOW (ref 1.15–1.40)
Calcium, Ion: 1.12 mmol/L — ABNORMAL LOW (ref 1.15–1.40)
HCT: 32 % — ABNORMAL LOW (ref 36.0–46.0)
HCT: 31 % — ABNORMAL LOW (ref 36.0–46.0)
Hemoglobin: 10.9 g/dL — ABNORMAL LOW (ref 12.0–15.0)
Hemoglobin: 10.5 g/dL — ABNORMAL LOW (ref 12.0–15.0)
O2 Saturation: 84 %
O2 Saturation: 90 %
Patient temperature: 98.9
Patient temperature: 35.9
Potassium: 6.2 mmol/L — ABNORMAL HIGH (ref 3.5–5.1)
Potassium: 5.4 mmol/L — ABNORMAL HIGH (ref 3.5–5.1)
Sodium: 150 mmol/L — ABNORMAL HIGH (ref 135–145)
Sodium: 151 mmol/L — ABNORMAL HIGH (ref 135–145)
TCO2: 40 mmol/L — ABNORMAL HIGH (ref 22–32)
TCO2: 42 mmol/L — ABNORMAL HIGH (ref 22–32)
pCO2 arterial: 44.6 mmHg (ref 32.0–48.0)
pCO2 arterial: 47 mmHg (ref 32.0–48.0)
pH, Arterial: 7.545 — ABNORMAL HIGH (ref 7.350–7.450)
pH, Arterial: 7.539 — ABNORMAL HIGH (ref 7.350–7.450)
pO2, Arterial: 43 mmHg — ABNORMAL LOW (ref 83.0–108.0)
pO2, Arterial: 50 mmHg — ABNORMAL LOW (ref 83.0–108.0)

## 2020-01-15 LAB — BLOOD GAS, ARTERIAL
Acid-Base Excess: 7 mmol/L — ABNORMAL HIGH (ref 0.0–2.0)
Bicarbonate: 36 mmol/L — ABNORMAL HIGH (ref 20.0–28.0)
Drawn by: 29503
FIO2: 100
MECHVT: 350 mL
O2 Saturation: 92.4 %
PEEP: 22 cmH2O
Patient temperature: 98.6
RATE: 34 resp/min
pCO2 arterial: 76 mmHg (ref 32.0–48.0)
pH, Arterial: 7.297 — ABNORMAL LOW (ref 7.350–7.450)
pO2, Arterial: 71.5 mmHg — ABNORMAL LOW (ref 83.0–108.0)

## 2020-01-15 LAB — CBC
HCT: 44.5 % (ref 36.0–46.0)
HCT: 37.6 % (ref 36.0–46.0)
Hemoglobin: 13.1 g/dL (ref 12.0–15.0)
Hemoglobin: 11.2 g/dL — ABNORMAL LOW (ref 12.0–15.0)
MCH: 28.5 pg (ref 26.0–34.0)
MCH: 28.7 pg (ref 26.0–34.0)
MCHC: 29.4 g/dL — ABNORMAL LOW (ref 30.0–36.0)
MCHC: 29.8 g/dL — ABNORMAL LOW (ref 30.0–36.0)
MCV: 96.9 fL (ref 80.0–100.0)
MCV: 96.4 fL (ref 80.0–100.0)
Platelets: 150 10*3/uL (ref 150–400)
Platelets: 99 10*3/uL — ABNORMAL LOW (ref 150–400)
RBC: 4.59 MIL/uL (ref 3.87–5.11)
RBC: 3.9 MIL/uL (ref 3.87–5.11)
RDW: 14.1 % (ref 11.5–15.5)
RDW: 14.4 % (ref 11.5–15.5)
WBC: 4.1 10*3/uL (ref 4.0–10.5)
WBC: 5.7 10*3/uL (ref 4.0–10.5)
nRBC: 0.5 % — ABNORMAL HIGH (ref 0.0–0.2)
nRBC: 0.3 % — ABNORMAL HIGH (ref 0.0–0.2)

## 2020-01-15 LAB — FIBRINOGEN: Fibrinogen: 214 mg/dL (ref 210–475)

## 2020-01-15 LAB — GLUCOSE, CAPILLARY
Glucose-Capillary: 233 mg/dL — ABNORMAL HIGH (ref 70–99)
Glucose-Capillary: 273 mg/dL — ABNORMAL HIGH (ref 70–99)
Glucose-Capillary: 302 mg/dL — ABNORMAL HIGH (ref 70–99)
Glucose-Capillary: 304 mg/dL — ABNORMAL HIGH (ref 70–99)
Glucose-Capillary: 308 mg/dL — ABNORMAL HIGH (ref 70–99)
Glucose-Capillary: 312 mg/dL — ABNORMAL HIGH (ref 70–99)
Glucose-Capillary: 334 mg/dL — ABNORMAL HIGH (ref 70–99)
Glucose-Capillary: 354 mg/dL — ABNORMAL HIGH (ref 70–99)
Glucose-Capillary: 357 mg/dL — ABNORMAL HIGH (ref 70–99)
Glucose-Capillary: 380 mg/dL — ABNORMAL HIGH (ref 70–99)

## 2020-01-15 LAB — APTT: aPTT: >200 seconds (ref 24–36)

## 2020-01-15 LAB — LACTIC ACID, PLASMA: Lactic Acid, Venous: 2.7 mmol/L (ref 0.5–1.9)

## 2020-01-15 LAB — HEPATIC FUNCTION PANEL
ALT: 71 U/L — ABNORMAL HIGH (ref 0–44)
AST: 56 U/L — ABNORMAL HIGH (ref 15–41)
Albumin: 2.3 g/dL — ABNORMAL LOW (ref 3.5–5.0)
Alkaline Phosphatase: 93 U/L (ref 38–126)
Bilirubin, Direct: 0.2 mg/dL (ref 0.0–0.2)
Indirect Bilirubin: 0.7 mg/dL (ref 0.3–0.9)
Total Bilirubin: 0.9 mg/dL (ref 0.3–1.2)
Total Protein: 4.5 g/dL — ABNORMAL LOW (ref 6.5–8.1)

## 2020-01-15 LAB — HEMOGLOBIN A1C
Hgb A1c MFr Bld: 10.7 % — ABNORMAL HIGH (ref 4.8–5.6)
Mean Plasma Glucose: 260.39 mg/dL

## 2020-01-15 LAB — PROTIME-INR
INR: 1.5 — ABNORMAL HIGH (ref 0.8–1.2)
Prothrombin Time: 17.5 seconds — ABNORMAL HIGH (ref 11.4–15.2)

## 2020-01-15 LAB — HEPARIN LEVEL (UNFRACTIONATED): Heparin Unfractionated: 1.06 IU/mL — ABNORMAL HIGH (ref 0.30–0.70)

## 2020-01-15 LAB — PREPARE RBC (CROSSMATCH)

## 2020-01-15 LAB — LACTATE DEHYDROGENASE: LDH: 559 U/L — ABNORMAL HIGH (ref 98–192)

## 2020-01-15 SURGERY — ECMO CANNULATION
Anesthesia: General

## 2020-01-15 MED ORDER — VECURONIUM BOLUS VIA INFUSION
0.0800 mg/kg | Freq: Once | INTRAVENOUS | Status: AC
  Administered 2020-01-15: 6.8 mg via INTRAVENOUS
  Filled 2020-01-15: qty 7

## 2020-01-15 MED ORDER — FENTANYL 2500MCG IN NS 250ML (10MCG/ML) PREMIX INFUSION
50.0000 ug/h | INTRAVENOUS | Status: DC
  Administered 2020-01-15 – 2020-01-16 (×3): 300 ug/h via INTRAVENOUS
  Filled 2020-01-15 (×3): qty 250

## 2020-01-15 MED ORDER — MIDAZOLAM 50MG/50ML (1MG/ML) PREMIX INFUSION
INTRAVENOUS | Status: AC
  Filled 2020-01-15: qty 50

## 2020-01-15 MED ORDER — CLONAZEPAM 0.5 MG PO TBDP
1.0000 mg | ORAL_TABLET | Freq: Two times a day (BID) | ORAL | Status: DC
  Administered 2020-01-16 – 2020-01-22 (×11): 1 mg
  Filled 2020-01-15 (×12): qty 2

## 2020-01-15 MED ORDER — DEXTROSE-NACL 5-0.45 % IV SOLN: INTRAVENOUS | Status: DC

## 2020-01-15 MED ORDER — HYDRALAZINE HCL 20 MG/ML IJ SOLN
INTRAMUSCULAR | Status: AC
  Administered 2020-01-15: 10 mg via INTRAVENOUS
  Filled 2020-01-15: qty 1

## 2020-01-15 MED ORDER — ARTIFICIAL TEARS OPHTHALMIC OINT
1.0000 "application " | TOPICAL_OINTMENT | Freq: Three times a day (TID) | OPHTHALMIC | Status: DC
  Administered 2020-01-15 – 2020-01-23 (×18): 1 via OPHTHALMIC
  Filled 2020-01-15 (×2): qty 3.5

## 2020-01-15 MED ORDER — SODIUM CHLORIDE 0.9 % IV SOLN
0.0750 mg/kg/h | INTRAVENOUS | Status: DC
  Administered 2020-01-16: 0.05 mg/kg/h via INTRAVENOUS
  Administered 2020-01-17: 0.068 mg/kg/h via INTRAVENOUS
  Administered 2020-01-18: 0.075 mg/kg/h via INTRAVENOUS
  Filled 2020-01-15 (×5): qty 250

## 2020-01-15 MED ORDER — FENTANYL BOLUS VIA INFUSION
50.0000 ug | INTRAVENOUS | Status: DC | PRN
  Administered 2020-01-19 – 2020-01-26 (×27): 50 ug via INTRAVENOUS
  Filled 2020-01-15: qty 50

## 2020-01-15 MED ORDER — ASCORBIC ACID 500 MG PO TABS
500.0000 mg | ORAL_TABLET | Freq: Every day | ORAL | Status: DC
  Administered 2020-01-16 – 2020-03-04 (×46): 500 mg
  Filled 2020-01-15 (×48): qty 1

## 2020-01-15 MED ORDER — INSULIN REGULAR(HUMAN) IN NACL 100-0.9 UT/100ML-% IV SOLN
INTRAVENOUS | Status: DC
  Administered 2020-01-16: 26 [IU]/h via INTRAVENOUS
  Filled 2020-01-15: qty 100

## 2020-01-15 MED ORDER — ZINC SULFATE 220 (50 ZN) MG PO CAPS
220.0000 mg | ORAL_CAPSULE | Freq: Every day | ORAL | Status: DC
  Administered 2020-01-16 – 2020-03-04 (×46): 220 mg
  Filled 2020-01-15 (×49): qty 1

## 2020-01-15 MED ORDER — CISATRACURIUM BOLUS VIA INFUSION
10.0000 mg | Freq: Once | INTRAVENOUS | Status: DC
  Filled 2020-01-15: qty 10

## 2020-01-15 MED ORDER — SODIUM CHLORIDE 0.9 % IV SOLN
0.0500 mg/kg/h | INTRAVENOUS | Status: DC
  Filled 2020-01-15: qty 250

## 2020-01-15 MED ORDER — SODIUM CHLORIDE 0.9 % IV SOLN
0.0000 ug/kg/min | INTRAVENOUS | Status: DC
  Administered 2020-01-15: 2 ug/kg/min via INTRAVENOUS
  Administered 2020-01-16: 6 ug/kg/min via INTRAVENOUS
  Administered 2020-01-16 – 2020-01-17 (×2): 4 ug/kg/min via INTRAVENOUS
  Filled 2020-01-15 (×8): qty 20

## 2020-01-15 MED ORDER — DEXTROSE 50 % IV SOLN: 0.0000 mL | INTRAVENOUS | Status: DC | PRN

## 2020-01-15 MED ORDER — PANTOPRAZOLE SODIUM 40 MG PO PACK
40.0000 mg | PACK | Freq: Every day | ORAL | Status: DC
  Administered 2020-01-16 – 2020-01-29 (×13): 40 mg
  Filled 2020-01-15 (×13): qty 20

## 2020-01-15 MED ORDER — VECURONIUM BROMIDE 10 MG IV SOLR
0.0000 ug/kg/min | INTRAVENOUS | Status: DC
  Administered 2020-01-15: 1 ug/kg/min via INTRAVENOUS
  Filled 2020-01-15: qty 100

## 2020-01-15 MED ORDER — MIDAZOLAM 50MG/50ML (1MG/ML) PREMIX INFUSION
2.0000 mg/h | INTRAVENOUS | Status: DC
  Administered 2020-01-15 (×2): 9 mg/h via INTRAVENOUS
  Administered 2020-01-16: 10 mg/h via INTRAVENOUS
  Administered 2020-01-16: 8 mg/h via INTRAVENOUS
  Administered 2020-01-16: 10 mg/h via INTRAVENOUS
  Administered 2020-01-16: 8 mg/h via INTRAVENOUS
  Administered 2020-01-17: 7 mg/h via INTRAVENOUS
  Administered 2020-01-17: 8 mg/h via INTRAVENOUS
  Administered 2020-01-17: 2 mg/h via INTRAVENOUS
  Filled 2020-01-15 (×8): qty 50

## 2020-01-15 MED ORDER — INSULIN ASPART 100 UNIT/ML ~~LOC~~ SOLN
0.0000 [IU] | SUBCUTANEOUS | Status: DC
  Administered 2020-01-15: 7 [IU] via SUBCUTANEOUS
  Administered 2020-01-15: 20 [IU] via SUBCUTANEOUS

## 2020-01-15 MED ORDER — CLONIDINE HCL 0.2 MG PO TABS
0.2000 mg | ORAL_TABLET | Freq: Two times a day (BID) | ORAL | Status: DC
  Administered 2020-01-15: 0.2 mg via ORAL
  Filled 2020-01-15: qty 1

## 2020-01-15 MED ORDER — FUROSEMIDE 10 MG/ML IJ SOLN
INTRAMUSCULAR | Status: AC
  Filled 2020-01-15: qty 4

## 2020-01-15 MED ORDER — OXYCODONE HCL 5 MG/5ML PO SOLN
5.0000 mg | Freq: Four times a day (QID) | ORAL | Status: DC
  Administered 2020-01-16 – 2020-01-23 (×24): 5 mg
  Filled 2020-01-15 (×25): qty 5

## 2020-01-15 MED ORDER — PANTOPRAZOLE SODIUM 40 MG IV SOLR
40.0000 mg | Freq: Once | INTRAVENOUS | Status: AC
  Administered 2020-01-15: 40 mg via INTRAVENOUS

## 2020-01-15 MED ORDER — HYDRALAZINE HCL 20 MG/ML IJ SOLN
10.0000 mg | INTRAMUSCULAR | Status: DC | PRN
  Administered 2020-01-15 – 2020-01-22 (×5): 10 mg via INTRAVENOUS
  Filled 2020-01-15 (×5): qty 1

## 2020-01-15 MED ORDER — ACETAMINOPHEN 160 MG/5ML PO SOLN
650.0000 mg | Freq: Four times a day (QID) | ORAL | Status: DC | PRN
  Administered 2020-01-25 – 2020-03-01 (×3): 650 mg
  Filled 2020-01-15 (×3): qty 20.3

## 2020-01-15 MED ORDER — INSULIN ASPART 100 UNIT/ML ~~LOC~~ SOLN
5.0000 [IU] | Freq: Once | SUBCUTANEOUS | Status: AC
  Administered 2020-01-15: 5 [IU] via SUBCUTANEOUS

## 2020-01-15 MED ORDER — HEPARIN SODIUM (PORCINE) 1000 UNIT/ML IJ SOLN
INTRAMUSCULAR | Status: AC
  Filled 2020-01-15: qty 2

## 2020-01-15 MED ORDER — SODIUM CHLORIDE 0.9 % IV SOLN: INTRAVENOUS | Status: DC

## 2020-01-15 MED ORDER — GUAIFENESIN-DM 100-10 MG/5ML PO SYRP
10.0000 mL | ORAL_SOLUTION | ORAL | Status: DC | PRN
  Administered 2020-02-21 – 2020-03-03 (×19): 10 mL
  Filled 2020-01-15 (×19): qty 10

## 2020-01-15 MED ORDER — INSULIN DETEMIR 100 UNIT/ML ~~LOC~~ SOLN
10.0000 [IU] | Freq: Two times a day (BID) | SUBCUTANEOUS | Status: DC
  Administered 2020-01-15: 10 [IU] via SUBCUTANEOUS
  Filled 2020-01-15 (×3): qty 0.1

## 2020-01-15 MED ORDER — INSULIN REGULAR(HUMAN) IN NACL 100-0.9 UT/100ML-% IV SOLN
INTRAVENOUS | Status: DC
  Administered 2020-01-15: 24 [IU]/h via INTRAVENOUS
  Filled 2020-01-15: qty 100

## 2020-01-15 MED ORDER — FENTANYL CITRATE (PF) 100 MCG/2ML IJ SOLN: 50.0000 ug | Freq: Once | INTRAMUSCULAR | Status: AC

## 2020-01-15 MED ORDER — FUROSEMIDE 10 MG/ML IJ SOLN
40.0000 mg | Freq: Once | INTRAMUSCULAR | Status: AC
  Administered 2020-01-15: 40 mg via INTRAVENOUS

## 2020-01-15 MED ORDER — MIDAZOLAM BOLUS VIA INFUSION
1.0000 mg | INTRAVENOUS | Status: DC | PRN
  Filled 2020-01-15: qty 2

## 2020-01-15 MED ORDER — PANTOPRAZOLE SODIUM 40 MG IV SOLR
40.0000 mg | Freq: Every day | INTRAVENOUS | Status: DC
  Filled 2020-01-15: qty 40

## 2020-01-15 MED ORDER — HYDROXYZINE HCL 25 MG PO TABS
25.0000 mg | ORAL_TABLET | Freq: Three times a day (TID) | ORAL | Status: DC | PRN
  Administered 2020-01-21 – 2020-03-03 (×22): 25 mg
  Filled 2020-01-15 (×24): qty 1

## 2020-01-15 MED ORDER — HEPARIN SODIUM (PORCINE) 1000 UNIT/ML IJ SOLN
INTRAMUSCULAR | Status: DC | PRN
  Administered 2020-01-15: 10000 [IU] via INTRAVENOUS

## 2020-01-15 MED ORDER — ALBUMIN HUMAN 5 % IV SOLN
INTRAVENOUS | Status: AC
  Administered 2020-01-15: 12.5 g
  Filled 2020-01-15: qty 500

## 2020-01-15 MED ORDER — INSULIN REGULAR(HUMAN) IN NACL 100-0.9 UT/100ML-% IV SOLN
INTRAVENOUS | Status: DC
  Administered 2020-01-15: 23 [IU]/h via INTRAVENOUS
  Filled 2020-01-15: qty 100

## 2020-01-15 SURGICAL SUPPLY — 8 items
CATH DUAL LUMEN CRESCENT 32FR (CATHETERS) ×2 IMPLANT
COVER DOME SNAP 22 D (MISCELLANEOUS) ×2 IMPLANT
GLIDESHEATH SLEND SS 6F .021 (SHEATH) ×2 IMPLANT
KIT VASCULAR ACCESS OPUS (MISCELLANEOUS) ×2 IMPLANT
PACK CARDIAC CATHETERIZATION (CUSTOM PROCEDURE TRAY) ×2 IMPLANT
PUMP BLOOD CENTRIMAG (PUMP) ×2 IMPLANT
SHEATH PROBE COVER 6X72 (BAG) ×4 IMPLANT
TRAY CATH 3LUMEN 20C SULFAFREE (CATHETERS) ×2 IMPLANT

## 2020-01-15 NOTE — CV Procedure (Signed)
Radial arterial line placement.    Emergent consent assumed. The right wrist was prepped and draped in the routine sterile fashion a single lumen radial arterial catheter was placed in the right radial artery using a modified Seldinger technique and u/s guidance. Good blood flow and wave forms. A dressing was placed.     Glori Bickers, MD  8:23 PM

## 2020-01-15 NOTE — Progress Notes (Signed)
NAME:  Alisha Stephenson, MRN:  [id number], DOB:  1966/04/28, LOS: 7 ADMISSION DATE:  01/30/2020, CONSULTATION DATE:  8/3 REFERRING MD:  Alfredia Ferguson, CHIEF COMPLAINT:  Dyspnea   Brief History   54 y/o female admitted on 8/2 with severe acute respiratory failure with hypoxemia due to COVID 19 pneumonia.  She developed symptoms 1 week prior to admission.  Past Medical History  DM2 Diverticulitis Gallstones Ovarian cyst NAFLD Asthma  Significant Hospital Events   8/2 admit 8/3 ICU transfer, intubated 8/4 prone, paralyze 8/9 significant desaturations today  Consults:  PCCM  Procedures:  8/3 ETT >  8/3 PICC >   Significant Diagnostic Tests:  7/31 CT head > NAICP 7/31 MRI/MRA brain > no acute changes, possibly small aneurysm ACOM  Micro Data:  8/2 blood > NG 8/2 SARS COV 2 > positive 8/4 resp > negative 8/4 urine >   Antimicrobials:  8/2 remdesivir >  8/2 actemra >  8/2 solumedrol >   8/3 ceftriaxone >  8/5 8/3 azithro >  8/5  Interim history/subjective:  Remained proned overnight. Desaturated into the 70s Was bagged-easy to bag Significant worsening of chest x-ray findings-fluids held, diuretics-Lasix 40 given Significant change in ventilator requirements now on 100%, PEEP of 22, rate of 34 tidal volume of 350 Peak airway pressure 36-40 Mean airway pressure 25-30   Objective   Blood pressure 131/76, pulse (!) 110, temperature 98.8 F (37.1 C), resp. rate (!) 32, height 5\' 4"  (1.626 m), weight 130.5 kg, SpO2 (!) 81 %.    Vent Mode: PRVC FiO2 (%):  [80 %-100 %] 100 % Set Rate:  [32 bmp-34 bmp] 34 bmp Vt Set:  [320 mL-350 mL] 350 mL PEEP:  [18 cmH20-22 cmH20] 22 cmH20 Plateau Pressure:  [29 cmH20-31 cmH20] 29 cmH20   Intake/Output Summary (Last 24 hours) at 01/20/2020 1037 Last data filed at 01/12/2020 1030 Gross per 24 hour  Intake 1712.01 ml  Output 3105 ml  Net -1392.99 ml   Filed Weights   01/13/20 0500 01/14/20 0500 01/29/2020 0500  Weight: 133.6 kg 130.1  kg 130.5 kg    Examination:  General: Critically ill-appearing woman lying in bed, prone HENT: Anicteric PULM: Vent dyssynchrony with plateau pressure -did improve with paralytic, driving pressure 14.  CTAB.  Thin secretions, suctioned easily CV: Sinus tachycardia on telemetry GI: Soft, obese, nondistended MSK: Appropriate muscle tone for age, pitting edema bilateral lower extremities Neuro: RASS -5,vent dyssynchrony, but patient receiving as needed NMB.  Not withdrawing from pain  Resolved Hospital Problem list     Assessment & Plan:   ARDS due to COVID 19 pneumonia > worsening oxygenation again on 8/6, bilateral effusions, presumed acute pulmonary edema -Continue low tidal volume ventilation.  Plateau is mildly elevated with driving pressure at goal.  Permissive hypercapnia. -Goal net negative fluid balance-Lasix x3 doses today. -Continue proning 16 h/day until P: F >150.  -Continue NMB PRN for significant dyssynchrony causing desaturation or other hemodynamic instability. -Continue Solu-Medrol per protocol; previously completed remdesivir per protocol.  Tocilizumab given on 8/2. -Goal RASS -4 to -5 -VAP prevention protocol -Continue to monitor off antibiotics; no clinical evidence of bacterial pneumonia -Will consider continuous paralysis if she continues to be dyssynchronous -Lasix 40 mg given IV x1  Need for sedation for mechanical ventilation Ventilator dyssynchrony  -Continue heavy sedation to facilitate intermittent neuromuscular blockade  -Continue fentanyl/versed infusion -Continue oxycodone and clonazepam  AKI> improved -Continue to monitor daily -goal of euvolemia -Electrolyte repletion as needed -Will continue to  require Foley catheter until no longer requiring prone ventilation.  Hyperglycemia > not controlled -Goal BG 140-180 while admitted to the ICU -Con't linagliptin -Continue basal insulin with resistant SSI  Will request for opinion regarding  possible ECMO if she continues to be difficult to oxygenate Best practice:  Diet: tube feeding Pain/Anxiety/Delirium protocol (if indicated): as above VAP protocol (if indicated): yes DVT prophylaxis: lovenox GI prophylaxis: famotidine Glucose control: Basal bolus insulin Mobility: bed rest Code Status: full Family Communication: updated [name] (husband) by FaceTime during evaluation 8/9 Disposition: ICU  Labs   CBC: Recent Labs  Lab 01/09/20 0036 01/09/20 0036 01/10/20 0346 01/10/20 0346 01/10/20 1212 01/11/20 0312 01/12/20 0213 01/13/20 0500 01/18/2020 0356  WBC 4.3   < > 7.0  --   --  5.9 6.2 6.2 4.1  NEUTROABS 3.5  --  6.0  --   --  4.9 4.9 4.9  --   HGB 15.1*   < > 13.7   < > 12.6 12.9 12.6 12.8 13.1  HCT 46.2*   < > 44.5   < > 37.0 40.1 40.0 42.4 44.5  MCV 88.5   < > 93.3  --   --  91.6 93.0 94.2 96.9  PLT 110*   < > 168  --   --  142* 147* 132* 150   < > = values in this interval not displayed.    Basic Metabolic Panel: Recent Labs  Lab 01/09/20 0036 01/09/20 1230 01/10/20 0346 01/10/20 0346 01/10/20 1212 01/10/20 1811 01/11/20 0312 01/12/20 0213 01/12/20 1002 01/12/20 1356 01/13/20 0500 01/14/20 0805 01/08/2020 0356  NA   < >  --  137   < >   < >  --  137   < > 142 143 145 142 146*  K   < >  --  3.7   < >   < >  --  3.5   < > 4.1 4.2 3.8 3.6 4.8  CL   < >  --  99   < >  --   --  103   < > 107 106 108 101 106  CO2   < >  --  25   < >  --   --  26   < > 27 26 28  32 32  GLUCOSE   < >  --  412*   < >  --   --  271*   < > 153* 205* 259* 145* 321*  BUN   < >  --  43*   < >  --   --  54*   < > 68* 71* 77* 69* 73*  CREATININE   < >  --  1.61*   < >  --   --  1.38*   < > 1.04* 1.09* 0.99 0.73 0.77  CALCIUM   < >  --  7.6*   < >  --   --  6.5*   < > 6.8* 6.9* 7.0* 7.5* 7.6*  MG  --  2.5* 2.3  --   --  2.1 2.2  --   --   --   --   --   --   PHOS  --  2.6 4.0  --   --  5.1* 5.2*  --   --   --   --   --   --    < > = values in this interval not displayed.    GFR: Estimated Creatinine Clearance:  106.6 mL/min (by C-G formula based on SCr of 0.77 mg/dL). Recent Labs  Lab 02/05/2020 1317 01/24/2020 2231 01/09/20 0036 01/09/20 1230 01/10/20 0346 01/10/20 0346 01/11/20 0312 01/12/20 0213 01/13/20 0500 01/21/2020 0356  PROCALCITON  --   --   --  1.23 2.34  --  0.98  --   --   --   WBC  --    < >   < >  --  7.0   < > 5.9 6.2 6.2 4.1  LATICACIDVEN 2.1*  --   --   --   --   --   --   --   --   --    < > = values in this interval not displayed.    Liver Function Tests: Recent Labs  Lab 01/09/20 0036 01/10/20 0346 01/11/20 0312 01/12/20 0213 01/13/20 0500  AST 72* 56* 67* 90* 71*  ALT 43 37 37 53* 58*  ALKPHOS 115 113 94 94 93  BILITOT 1.0 0.8 0.6 0.5 0.4  PROT 6.5 5.7* 5.2* 5.1* 5.3*  ALBUMIN 3.1* 2.7* 2.6* 2.7* 2.6*   No results for input(s): LIPASE, AMYLASE in the last 168 hours. No results for input(s): AMMONIA in the last 168 hours.  ABG    Component Value Date/Time   PHART 7.302 (L) 01/14/2020 0842   PCO2ART 71.7 (HH) 01/14/2020 0842   PO2ART 93.5 01/14/2020 0842   HCO3 34.4 (H) 01/14/2020 0842   TCO2 26 01/10/2020 1212   ACIDBASEDEF 2.0 01/11/2020 1203   O2SAT 96.9 01/14/2020 0842     Coagulation Profile: No results for input(s): INR, PROTIME in the last 168 hours.  Cardiac Enzymes: No results for input(s): CKTOTAL, CKMB, CKMBINDEX, TROPONINI in the last 168 hours.  HbA1C: Hgb A1c MFr Bld  Date/Time Value Ref Range Status  09/04/2019 03:22 PM 9.9 (H) 4.6 - 6.5 % Final    Comment:    Glycemic Control Guidelines for People with Diabetes:Non Diabetic:  <6%Goal of Therapy: <7%Additional Action Suggested:  >8%   04/17/2019 03:56 PM 9.6 (H) 4.6 - 6.5 % Final    Comment:    Glycemic Control Guidelines for People with Diabetes:Non Diabetic:  <6%Goal of Therapy: <7%Additional Action Suggested:  >8%     CBG: Recent Labs  Lab 01/16/2020 0000 01/26/2020 0324 01/23/2020 0807 02/02/2020 0923 01/23/2020 1011  GLUCAP 380* 302*  312* 354* 334*   The patient is critically ill with multiple organ systems failure and requires high complexity decision making for assessment and support, frequent evaluation and titration of therapies, application of advanced monitoring technologies and extensive interpretation of multiple databases. Critical Care Time devoted to patient care services described in this note independent of APP/resident time (if applicable)  is 40 minutes.   Sherrilyn Rist MD [facility] Personal pager: [phone] If unanswered, please page CCM On-call: [phone]

## 2020-01-15 NOTE — CV Procedure (Signed)
ECMO NOTE:  Indication: Respiratory failure due to COVID PNA  Initial cannulation date: 01/08/2020  ECMO type: VV ECMO (Centrimar with oximizer)  Dual lumen Inflow/return cannula:  1) 32 FR RIJ [place] placed 01/14/2020  ECMO events:  Initial cannulation 01/21/2020   Daily data:  Flow 4.35 RPM 3800 Sweep 4.5  Labs:  7.54/45/43/84% Hgb11.2 LDH 608 (pre-cannulation) -> 558 AC = bival Lactic acid 2.7  Plan:  Continue ECMO support. Increase flows as tolerated. Possible cannula repositioning in am  Continue lung rest on vent.   Glori Bickers, MD  11:34 PM

## 2020-01-15 NOTE — CV Procedure (Signed)
  ECMO INITIATION   Patient: Alisha Stephenson, 1966-04-02, 54 y.o. Location:   Date of Service:  01/30/2020     Time: 8:40 PM  Date of Admission: 01/07/2020 Admitting diagnosis: Acute respiratory failure due to COVID-19 ([place])  Ht: 5\' 4"  (162.6 cm) Wt: 130.5 kg BSA: Body surface area is 2.43 meters squared.  Blood Type: O POS Performed at [facility], [place] [address] [place], [place] [address]  Allergies:  Allergies  Allergen Reactions  . Sulfa Antibiotics Rash and Other (See Comments)    Past medical history:  Past Medical History:  Diagnosis Date  . Asthma   . Diabetes mellitus without complication ([place])   . Diverticulitis   . Gallstones   . IBS (irritable bowel syndrome)   . NAFLD (nonalcoholic fatty liver disease)    CT scan 2015  . Ovarian cyst    Past surgical history:  Past Surgical History:  Procedure Laterality Date  . ABDOMINAL HYSTERECTOMY    . CESAREAN SECTION    . CHOLECYSTECTOMY    . COLONOSCOPY     about 2011    Indication for ECMO: ARDS  ECMO was deployed at *1800** and initiated at 1933  Anticoagulation achieved with Heparin bolus o f 10000  units given to patient at [place]. Cannulated for ECMO Mode: VV and achieved initial ECMO Flow (LPM): 4.08 and ECMO Sweep Gas (LPM): 4.    ECMO Cannula Information     Staff Present  Primary Perfusionist Mariam Dollar CCP  Assisting Perfusionist/ECMO Specialist Gweneth Fritter CCP  Cannulating Physician Dr. Orvan Seen   ECMO Lot Numbers  Centrimag Pump Head   [phone]  Oxygenator  [id number]  Tubing Pack  [id number]  ECMO Goals  Flow goal    3.8lpm to 4.8lpm  Anticoagulation goal      Cardiac goal      Respiratory goal      Other goal       ECMO Handoff  Patient Information * Age Height Weight BSA IBW BMI  54 y.o. 5\' 4"  (162.6 cm)  (130.5 kg Body surface area is 2.43 meters squared. No data recorded Body mass index is 49.38 kg/m.   Review History * Primary Diagnosis   Acute respiratory  failure due to COVID-19 [facility])  Prior Cardiac Arrest within 24hrs of ECMO initiation?   ECMO and MCS * Type ECMO Flow ECMO Sweep Gases   ECMO Device: Centrimag   Flow (LPM): 4.08   Sweep Gas (LPM): 4     Additional Mechanical Support   Ventilation *    Vent Mode: (S) PCV (vent changes per Dr [name]), Vt Set: 350 mL, Set Rate: 10 bmp, FiO2 (%): 100 %, I Time: 0.9 Sec(s), PEEP: 10 cmH20     Cannula Size and Locations       Drainage  32 F Cresent Rt Internal Jugular  Return  32 F Cresent Rt Internal Jugular   *Cannula(e) sutured and anchored, secured and dressed. yes  Labs and Imaging *  *Cannulation position verified via imaging on arrival to ICU. Concerns communicated to attending surgeon. Labs reviewed. yes  All ECMO safety checks complete. ECMO flowsheet initiated, applicable charges captured, LDA's entered/confirmed, imaging and labs verified, blood products available, and report given to Cari Caraway RRT**.

## 2020-01-15 NOTE — Consult Note (Signed)
Advanced Heart Failure Team Consult Note   Primary Physician: Shelda Pal, DO PCP-Cardiologist:  None  Requesting: Dr.  Ander Slade  Reason for Consultation: Acute hypoxic/hypercapneic respiratory failure in setting of COVID PNA/ARDS - ? Need for ECMO  HPI:    Alisha Stephenson is seen today for evaluation of acute hypoxic/hypercapneic respiratory failure in setting of COVID PNA/ARDS - ? Need for ECMO.   Alisha Stephenson is a 54 y/o woman with morbid obesity, poorly controlled DM2, asthma, fatty liver.   In speaking with her family there is no history of any known heart disease. At baseline she is quite functional. Works FT at desk job. Able to walk a mile without too much difficulty. Main issues have been her weight and diabetes.   Admitted 8/2 with COVID PNA requiring high flow O2 (unvaccinated). Deteriorated quickly and was intubated on 8/3. On 8/4 pt was proned and paralyzed and continued proning protocol with paralysis until 8/6. Since then she had been relatively stable on high vent settings but not requiring paralytic. Earlier today had worsening respiratory status and nearly maxed out on vent. CXR with worsening bilateral infiltrates. ABG 7.29/76/71/92% on 100%   Echo 8/4 EF 60-65% RV ok Personally reviewed  RESP score (57% survival  Rate 57%)    Review of Systems: Not obtainable (intubated)   Home Medications Prior to Admission medications   Medication Sig Start Date End Date Taking? Authorizing Provider  albuterol (PROVENTIL HFA;VENTOLIN HFA) 108 (90 Base) MCG/ACT inhaler Inhale 2 puffs into the lungs every 6 (six) hours as needed for wheezing or shortness of breath. 03/23/18   Shelda Pal, DO  azithromycin (ZITHROMAX) 250 MG tablet Take 2 tabs the first day and then 1 tab daily until you run out. 01/02/20   Nani Ravens, Crosby Oyster, DO  benzonatate (TESSALON) 200 MG capsule Take 200 mg by mouth 3 (three) times daily. 12/31/19   [provider]   Blood Glucose Monitoring Suppl (ONETOUCH VERIO) w/Device KIT Test as directed once weekly to check blood sugar. 04/25/18   Shelda Pal, DO  dicyclomine (BENTYL) 10 MG capsule Take 1 capsule (10 mg total) by mouth 4 (four) times daily -  before meals and at bedtime. 09/13/19   Shelda Pal, DO  doxycycline (VIBRA-TABS) 100 MG tablet Take 100 mg by mouth 2 (two) times daily. 10 day supply 12/31/19   [provider]  fluticasone (FLONASE) 50 MCG/ACT nasal spray PLACE 2 SPRAYS INTO BOTH NOSTRILS DAILY. 08/24/18   Saguier, Percell Miller, PA-C  glucose blood (ONETOUCH VERIO) test strip USE STRIP TO CHECK GLUCOSE THREE TIMES DAILY 08/31/18   Shelda Pal, DO  ibuprofen (ADVIL) 600 MG tablet Take 1 tablet (600 mg total) by mouth every 8 (eight) hours as needed. 01/06/20   Charlesetta Shanks, MD  insulin lispro (HUMALOG) 100 UNIT/ML KwikPen 15 units with each meal + 8 g/1 unit sliding scale. 06/13/19   Shelda Pal, DO  meloxicam (MOBIC) 7.5 MG tablet Take 1 tablet (7.5 mg total) by mouth daily. 12/11/19   Tasia Catchings, Amy V, PA-C  ONE TOUCH LANCETS MISC Use once a week to check blood sugar. 04/25/18   Shelda Pal, DO  tiZANidine (ZANAFLEX) 2 MG tablet Take 1 tablet (2 mg total) by mouth every 8 (eight) hours as needed for muscle spasms. 12/11/19   Yu, Amy V, PA-C  TOUJEO MAX SOLOSTAR 300 UNIT/ML Solostar Pen INJECT 62 UNITS INTO THE SKIN 2 (TWO) TIMES DAILY. Patient taking differently:  Inject 62 Units into the skin 2 (two) times daily.  11/24/19   Shelda Pal, DO    Past Medical History: Past Medical History:  Diagnosis Date  . Asthma   . Diabetes mellitus without complication ([place])   . Diverticulitis   . Gallstones   . IBS (irritable bowel syndrome)   . NAFLD (nonalcoholic fatty liver disease)    CT scan 2015  . Ovarian cyst     Past Surgical History: Past Surgical History:  Procedure Laterality Date  . ABDOMINAL HYSTERECTOMY    . CESAREAN  SECTION    . CHOLECYSTECTOMY    . COLONOSCOPY     about 2011    Family History: Family History  Problem Relation Age of Onset  . Diabetes Father   . Heart disease Father   . Breast cancer Maternal Grandmother   . Breast cancer Maternal Aunt   . Colon cancer Neg Hx   . Esophageal cancer Neg Hx     Social History: Social History   Socioeconomic History  . Marital status: Married    Spouse name: Not on file  . Number of children: 2  . Years of education: Not on file  . Highest education level: Not on file  Occupational History  . Occupation: Therapist, art   Tobacco Use  . Smoking status: Former Research scientist (life sciences)  . Smokeless tobacco: Never Used  . Tobacco comment: quit over 10 years ago  Vaping Use  . Vaping Use: Never used  Substance and Sexual Activity  . Alcohol use: No    Comment: rarely  . Drug use: No  . Sexual activity: Never  Other Topics Concern  . Not on file  Social History Narrative  . Not on file   Social Determinants of Health   Financial Resource Strain:   . Difficulty of Paying Living Expenses:   Food Insecurity:   . Worried About Charity fundraiser in the Last Year:   . Arboriculturist in the Last Year:   Transportation Needs:   . Film/video editor (Medical):   Marland Kitchen Lack of Transportation (Non-Medical):   Physical Activity:   . Days of Exercise per Week:   . Minutes of Exercise per Session:   Stress:   . Feeling of Stress :   Social Connections:   . Frequency of Communication with Friends and Family:   . Frequency of Social Gatherings with Friends and Family:   . Attends Religious Services:   . Active Member of Clubs or Organizations:   . Attends Archivist Meetings:   Marland Kitchen Marital Status:     Allergies:  Allergies  Allergen Reactions  . Sulfa Antibiotics Rash and Other (See Comments)    Objective:    Vital Signs:   Temp:  [98.2 F (36.8 C)-99.1 F (37.3 C)] 99.1 F (37.3 C) (08/09 1400) Pulse Rate:  [100-115] 112 (08/09  1700) Resp:  [32] 32 (08/09 0400) BP: (128-163)/(61-81) 159/74 (08/09 1700) SpO2:  [81 %-94 %] 90 % (08/09 1700) FiO2 (%):  [80 %-100 %] 100 % (08/09 1700) Weight:  [130.5 kg] 130.5 kg (08/09 0500) Last BM Date: 01/09/20  Weight change: Filed Weights   01/13/20 0500 01/14/20 0500 01/22/2020 0500  Weight: 133.6 kg 130.1 kg 130.5 kg    Intake/Output:   Intake/Output Summary (Last 24 hours) at 01/14/2020 1745 Last data filed at 01/10/2020 1700 Gross per 24 hour  Intake 2870.97 ml  Output 3075 ml  Net -204.03 ml  Physical Exam    General:  Critically ill obese woman on vent. Sedated/paralyzed HEENT: normal + ETT Neck: supple. Thick unable to see JVP . Carotids 2+ bilat; no bruits. No lymphadenopathy or thyromegaly appreciated. Cor: PMI nondisplaced. Tachy regular. Distant Lungs: + diffuse crakles Abdomen: obese soft, nontender, nondistended.  Extremities: no cyanosis, clubbing, rash, edema Neuro: intubated sedated    Telemetry   Sinus tach 100-120 Personally reviewed  EKG    Sinus tach 132 low volts Personally reviewed  Labs   Basic Metabolic Panel: Recent Labs  Lab 01/09/20 0036 01/09/20 1230 01/10/20 0346 01/10/20 0346 01/10/20 1212 01/10/20 1811 01/11/20 0312 01/12/20 0213 01/12/20 1002 01/12/20 1002 01/12/20 1356 01/12/20 1356 01/13/20 0500 01/14/20 0805 01/18/2020 0356  NA   < >  --  137   < >   < >  --  137   < > 142  --  143  --  145 142 146*  K   < >  --  3.7   < >   < >  --  3.5   < > 4.1  --  4.2  --  3.8 3.6 4.8  CL   < >  --  99   < >  --   --  103   < > 107  --  106  --  108 101 106  CO2   < >  --  25   < >  --   --  26   < > 27  --  26  --  28 32 32  GLUCOSE   < >  --  412*   < >  --   --  271*   < > 153*  --  205*  --  259* 145* 321*  BUN   < >  --  43*   < >  --   --  54*   < > 68*  --  71*  --  77* 69* 73*  CREATININE   < >  --  1.61*   < >  --   --  1.38*   < > 1.04*  --  1.09*  --  0.99 0.73 0.77  CALCIUM   < >  --  7.6*   < >  --    --  6.5*   < > 6.8*   < > 6.9*   < > 7.0* 7.5* 7.6*  MG  --  2.5* 2.3  --   --  2.1 2.2  --   --   --   --   --   --   --   --   PHOS  --  2.6 4.0  --   --  5.1* 5.2*  --   --   --   --   --   --   --   --    < > = values in this interval not displayed.    Liver Function Tests: Recent Labs  Lab 01/09/20 0036 01/10/20 0346 01/11/20 0312 01/12/20 0213 01/13/20 0500  AST 72* 56* 67* 90* 71*  ALT 43 37 37 53* 58*  ALKPHOS 115 113 94 94 93  BILITOT 1.0 0.8 0.6 0.5 0.4  PROT 6.5 5.7* 5.2* 5.1* 5.3*  ALBUMIN 3.1* 2.7* 2.6* 2.7* 2.6*   No results for input(s): LIPASE, AMYLASE in the last 168 hours. No results for input(s): AMMONIA in the last 168 hours.  CBC: Recent Labs  Lab 01/09/20 0036  01/09/20 0036 01/10/20 0346 01/10/20 0346 01/10/20 1212 01/11/20 0312 01/12/20 0213 01/13/20 0500 01/29/2020 0356  WBC 4.3   < > 7.0  --   --  5.9 6.2 6.2 4.1  NEUTROABS 3.5  --  6.0  --   --  4.9 4.9 4.9  --   HGB 15.1*   < > 13.7   < > 12.6 12.9 12.6 12.8 13.1  HCT 46.2*   < > 44.5   < > 37.0 40.1 40.0 42.4 44.5  MCV 88.5   < > 93.3  --   --  91.6 93.0 94.2 96.9  PLT 110*   < > 168  --   --  142* 147* 132* 150   < > = values in this interval not displayed.    Cardiac Enzymes: No results for input(s): CKTOTAL, CKMB, CKMBINDEX, TROPONINI in the last 168 hours.  BNP: BNP (last 3 results) No results for input(s): BNP in the last 8760 hours.  ProBNP (last 3 results) No results for input(s): PROBNP in the last 8760 hours.   CBG: Recent Labs  Lab 02/01/2020 0923 01/22/2020 1011 01/29/2020 1115 01/14/2020 1217 01/27/2020 1543  GLUCAP 354* 334* 273* 233* 357*    Coagulation Studies: No results for input(s): LABPROT, INR in the last 72 hours.   Imaging   DG Abd 1 View  Result Date: 01/14/2020 CLINICAL DATA:  54 year old female enteric tube placement. EXAM: ABDOMEN - 1 VIEW COMPARISON:  01/13/2020 and earlier. FINDINGS: Portable AP prone view at 0031 hours. Enteric feeding tube tip is  at the level of the distal gastric body, projecting in the midline. Paucity of bowel gas. Stable cholecystectomy clips. No acute osseous abnormality identified. IMPRESSION: Enteric feeding tube tip at the level of the distal gastric body. Paucity of bowel gas. Electronically Signed   By: Genevie Ann M.D.   On: 01/17/2020 02:31   DG Chest Port 1 View  Result Date: 02/06/2020 CLINICAL DATA:  54 year old female with respiratory failure due to COVID 19. EXAM: PORTABLE CHEST 1 VIEW COMPARISON:  01/13/2020 FINDINGS: The ETT tip is stable above the carina. There is a feeding tube with tip noted just below the level of the GE junction. The right arm PICC line tip is in the distal SVC. Significant interval progression of bilateral pulmonary opacities. IMPRESSION: 1. Significant interval progression of bilateral pulmonary opacities compatible with progressive multifocal pneumonia. 2. Stable support apparatus. 3. Tip of the feeding tube is just below the level of the GE junction. Electronically Signed   By: Kerby Moors M.D.   On: 01/23/2020 10:27      Medications:     Current Medications: . artificial tears  1 application Both Eyes G8Q  . vitamin C  500 mg Oral Daily  . chlorhexidine gluconate (MEDLINE KIT)  15 mL Mouth Rinse BID  . Chlorhexidine Gluconate Cloth  6 each Topical Daily  . cisatracurium  10 mg Intravenous Once  . clonazePAM  1 mg Oral BID  . enoxaparin (LOVENOX) injection  70 mg Subcutaneous QHS  . famotidine  20 mg Per Tube BID  . feeding supplement (PROSource TF)  90 mL Per Tube BID  . fentaNYL (SUBLIMAZE) injection  50 mcg Intravenous Once  . free water  200 mL Per Tube Q4H  . insulin aspart  0-20 Units Subcutaneous Q4H  . insulin detemir  10 Units Subcutaneous BID  . ipratropium-albuterol  3 mL Nebulization Q6H  . linagliptin  5 mg Oral Daily  . mouth  rinse  15 mL Mouth Rinse 10 times per day  . methylPREDNISolone (SOLU-MEDROL) injection  40 mg Intravenous Daily  . oxyCODONE  5 mg  Oral Q6H  . polyethylene glycol  17 g Per Tube Daily  . sennosides  5 mL Per Tube Daily  . sodium chloride flush  10-40 mL Intracatheter Q12H  . zinc sulfate  220 mg Oral Daily     Infusions: . sodium chloride Stopped (01/18/2020 1613)  . sodium chloride Stopped (01/19/2020 1009)  . cisatracurium (NIMBEX) infusion    . dextrose 5 % and 0.45% NaCl Stopped (01/27/2020 0827)  . feeding supplement (VITAL 1.5 CAL) Stopped (01/27/2020 1700)  . fentaNYL infusion INTRAVENOUS 300 mcg/hr (01/20/2020 1700)  . insulin Stopped (01/14/2020 1224)  . midazolam 9 mg/hr (01/07/2020 1700)  . norepinephrine (LEVOPHED) Adult infusion        Assessment/Plan   1. Acute hypoxic/hypercapneic respiratory failure in setting of severe COVID PNA/ARDS - admit 8/2 - intubation 8/3 (today = day 7) - has received actmera (compelted 8/2), remdesivir (completed 8/6) and steroids - failing full vent support with proning/parlytic - RESP score 0 - have d/w with full ECMO team and family. Will plan for VV ECMO support today. She is at higher risk for complications given her size and uncontrolled DM2  - Once cannulated proceed with lung rest strategy on vent. Will need trach soon - AC with bival. Discussed dosing with PharmD personally.  2. Morbid obesity - Body mass index is 49.38 kg/m.  3. Poorly controlled DM2 - HgBA1c 10.7 - Adjust insulin as needed  CRITICAL CARE Performed by: Glori Bickers  Total critical care time: 120 minutes  Critical care time was exclusive of separately billable procedures and treating other patients.  Critical care was necessary to treat or prevent imminent or life-threatening deterioration.  Critical care was time spent personally by me (independent of midlevel providers or residents) on the following activities: development of treatment plan with patient and/or surrogate as well as nursing, discussions with consultants, evaluation of patient's response to treatment, examination of  patient, obtaining history from patient or surrogate, ordering and performing treatments and interventions, ordering and review of laboratory studies, ordering and review of radiographic studies, pulse oximetry and re-evaluation of patient's condition.    Length of Stay: 7  Glori Bickers, MD  01/09/2020, 5:45 PM  Advanced Heart Failure Team Pager [phone] (M-F; 7a - 4p)  Please contact [facility] for night-coverage after hours (4p -7a ) and weekends on [email or web address]

## 2020-01-15 NOTE — Anesthesia Preprocedure Evaluation (Deleted)
Anesthesia Evaluation    Reviewed: Allergy & Precautions, Patient's Chart, lab work & pertinent test results  Airway        Dental   Pulmonary asthma , former smoker,  Pneumonia due to COVID-19 Acute respiratory failure due to COVID-19          Cardiovascular negative cardio ROS    ECG: ST, rate 132  ECHO: 1. Left ventricular ejection fraction, by estimation, is 60 to 65%. The left ventricle has normal function. The left ventricle has no regional wall motion abnormalities. Left ventricular diastolic parameters are consistent with Grade I diastolic dysfunction (impaired relaxation). 2. Right ventricular systolic function is normal. The right ventricular size is normal. 3. The mitral valve is normal in structure. No evidence of mitral valve regurgitation. No evidence of mitral stenosis. 4. The aortic valve is normal in structure. Aortic valve regurgitation is not visualized. No aortic stenosis is present. 5. The inferior vena cava is normal in size with greater than 50% respiratory variability, suggesting right atrial pressure of 3 mmHg.   Neuro/Psych negative neurological ROS     GI/Hepatic negative GI ROS, NAFLD (nonalcoholic fatty liver disease) IBS (irritable bowel syndrome)   Endo/Other  diabetes, Insulin DependentMorbid obesity  Renal/GU negative Renal ROS     Musculoskeletal negative musculoskeletal ROS (+)   Abdominal   Peds  Hematology negative hematology ROS (+)   Anesthesia Other Findings covid  Reproductive/Obstetrics                             Anesthesia Physical Anesthesia Plan  ASA: V  Anesthesia Plan: General   Post-op Pain Management:    Induction:   PONV Risk Score and Plan:   Airway Management Planned:   Additional Equipment:   Intra-op Plan:   Post-operative Plan:   Informed Consent:   Plan Discussed with:   Anesthesia Plan Comments:          Anesthesia Quick Evaluation

## 2020-01-15 NOTE — Progress Notes (Signed)
ANTICOAGULATION CONSULT NOTE - Initial Consult  Pharmacy Consult for bivalirudin Indication: VV ECMO  Allergies  Allergen Reactions  . Sulfa Antibiotics Rash and Other (See Comments)    Patient Measurements: Height: 5\' 4"  (162.6 cm) Weight: 130.5 kg (287 lb 11.2 oz) IBW/kg (Calculated) : 54.7 Heparin Dosing Weight: 87kg  Vital Signs: Temp: 99.1 F (37.3 C) (08/09 1400) Temp Source: Bladder (08/09 1100) BP: 153/82 (08/09 2012) Pulse Rate: 0 (08/09 2021)  Labs: Recent Labs    01/13/20 0500 01/14/20 0805 01/23/2020 0356  HGB 12.8  --  13.1  HCT 42.4  --  44.5  PLT 132*  --  150  CREATININE 0.99 0.73 0.77    Estimated Creatinine Clearance: 106.6 mL/min (by C-G formula based on SCr of 0.77 mg/dL).   Medical History: Past Medical History:  Diagnosis Date  . Asthma   . Diabetes mellitus without complication ([place])   . Diverticulitis   . Gallstones   . IBS (irritable bowel syndrome)   . NAFLD (nonalcoholic fatty liver disease)    CT scan 2015  . Ovarian cyst     Assessment: 54 year old female admitted with COVID s/p treatment with antivirals completed 8/6. Now with continued low saturations and decision was made to transfer to [facility] for ECMO cannulation and continue with VV ECMO.   Patient was cannulated without significant event in cath lab, given 10,000 units of heparin in lab. Discussed with Cardiology, ECMO specialist, and ECMO coordinator and will start bivalirudin post op for circuit anticoagulation. ACT 318 during cannulation.   Labs this am - hgb 13, plt 150, LDH 608, fibrinogen 327.    Goal of Therapy:  aPTT 50-80 seconds Monitor platelets by anticoagulation protocol: Yes   Plan:  Start Bivalirudin 0.05mg /kg/hour Aptt in 4 hours Will plan on 0500/1700 aptt checks once dose is stable  Erin Hearing PharmD., BCPS Clinical Pharmacist 01/10/2020 10:12 PM

## 2020-01-15 NOTE — Progress Notes (Signed)
Patient transported from cath lab to 7Q25 with no complications.

## 2020-01-15 NOTE — CV Procedure (Signed)
Central Venous Catheter Insertion Procedure Note ADYSSON REVELLE [id number] May 24, 1966    Procedure: Insertion of Central Venous Catheter Indications: Drug and/or fluid administration   Procedure Details Consent: Risks of procedure as well as the alternatives and risks of each were explained to the (patient/caregiver).  Consent for procedure obtained. Time Out: Verified patient identification, verified procedure, site/side was marked, verified correct patient position, special equipment/implants available, medications/allergies/relevent history reviewed, required imaging and test results available.  Performed   Maximum sterile technique was used including antiseptics, cap, gloves, gown, hand hygiene, mask and sheet. Skin prep: Chlorhexidine; local anesthetic administered A antimicrobial bonded/coated triple lumen catheter was placed in the left internal jugular vein using the Seldinger technique and u/s guidance.   Evaluation Blood flow good Complications: No apparent complications Patient did tolerate procedure well. Verified placement by fluoro.    Glori Bickers, MD  8:20 PM

## 2020-01-15 NOTE — TOC Progression Note (Signed)
Transition of Care [facility]) - Progression Note    Patient Details  Name: Alisha Stephenson MRN: [id number] Date of Birth: 08/10/1965  [facility] CM/SW Contact  Leeroy Cha, RN Phone Number: 01/31/2020, 7:37 AM  Clinical Narrative:    Remains on the vent at 80-100% fi02,iv solu-medrol, iv levophed, iv sedation and VAP protocol in place. following for progression and toc needs Patient remains critical at this time. Expected Discharge Plan: Home/Self Care Barriers to Discharge: Continued Medical Work up  Expected Discharge Plan and Services Expected Discharge Plan: Home/Self Care   Discharge Planning Services: CM Consult   Living arrangements for the past 2 months: Single Family Home                                       Social Determinants of Health (SDOH) Interventions    Readmission Risk Interventions No flowsheet data found.

## 2020-01-15 NOTE — Progress Notes (Signed)
[place] for bivalirudin Indication: VV ECMO  10,000 unit heparin bolus given during cannulation.   Aptt resulted >200s will hold bival and recheck in 2 hours. Will plan on starting bival when aptt is closer to target range of 50-85s. Minimal amount of oral bleeding noted. Cannulation sites without issues currently.  Hgb also down from 13>11. Plt down 150>99. LDH stable. Fibrinogen down to 214.   Erin Hearing PharmD., BCPS Clinical Pharmacist 01/18/2020 10:34 PM

## 2020-01-15 NOTE — Progress Notes (Addendum)
RN from cath lab given report at 1738.  Pt taken to [facility] Cath lab via carelink at 1750 without issue. Pt's belongings sent with carelink. Report also given to RN on [place], Elmyra Ricks.  RN called and updated pt's husband and daughter on the move to [facility].

## 2020-01-15 NOTE — Progress Notes (Signed)
Patient underwent successful VV ECMO deployment in the cath lab earlier this evening.  On arriving back to the CCU vent settings adjusted for lung rest by Dr. Ruthann Cancer. Sats dropped into low 80s. ECMO speed increased but patient began to have chugging that limited flow. Given 2 bottles albumin.   CXR checked. ECMO cannula in good position but may be slightly low.   BP also very high with SBP > 180. Given several rounds of IV hydralazine and oral clonidine started. Given additional sedation. Bis placed and was adequate at 43.   Labs with hgb 11.2. Lactate 2.7.   ECMO speed adjusted up slowly and tolerated well. Sats 90% with no chugging.   Will continue current support. Wil do echo in am and possibly pull ECMO cannula back 2-3 cm.   D/w ECMO coordinator and Dr. Orvan Seen.   Additional CCT 90 minutes.   Glori Bickers, MD  11:28 PM

## 2020-01-15 NOTE — Progress Notes (Signed)
Pt Alisha Stephenson tube came out several centimeters while repositioning. Tube feeds were already being held at this time. RN assisting with repositioning advanced Alisha Stephenson back to original positioning and a KUB was ordered to check placement.

## 2020-01-15 NOTE — Consult Note (Signed)
ECMO Consult  Consult for VV ECMO for COVID pneumonia Symptoms began around 7/26 With diarrhea and headache. On 7/31 pt presented to ed for worsening ha and w/u negative for acute process at that time. On 8/1 pt developed sob and and cough req 2L to maintain sats. She was started on the usual therapies, remdesivir, steroids and actemera. Unfortunately pt had worsening hypoxemia and req initiation of Hhfnc and ultimately intubation on 8/3.   On 8/4 pt was proned and paralyzed and continued proning protocol with paralysis until 8/6. Since then she had been relatively stable on high vent settings but not requiring paralytic. Today she had increased vent settings and has not been able to improve >88%.   Now unable to oxygenate, unable to ventilate despite maximal vent settings.   RESP Score 2 points  Class III Risk class 57 % In-hospital survival  Exam: pending  A: Severe COVID ARDS Class 3 obesity and duration of ventilation already are concerns I have for ECMO.  -however, if surgeon thinks anatomy okay I think we should cannulate  P: -resuming continuous paralytic and cont proning protocol.  -have reached out to surgical and multidisciplinary team at this time for consensus.  -no absolute contraindications to VV ECMO  If amendable then will transfer pt to cath lab for cannulation.   - Will discuss AC and cannula configuration with TCTS and CHF teams - For vent, post cannulation do PC rate 15, PEEP 10, PC 10, FiO2 0.5 - Versed/fentanyl for sedation target BIS 40-60    The patient is critically ill with multiple organ systems failure and requires high complexity decision making for assessment and support, frequent evaluation and titration of therapies, application of advanced monitoring technologies and extensive interpretation of multiple databases.  Critical care time 40 mins. This represents my time independent of the NP's/PA's/med student/residents time taking care of the  pt. This is excluding procedures   [facility] 01/16/2020, 11:57 AM

## 2020-01-15 NOTE — Progress Notes (Signed)
[place] Progress Note Patient Name: Alisha Stephenson DOB: 08-30-1965 MRN: [id number]   Date of Service  02/05/2020  HPI/Events of Note  CBG going up. Received Levemir 31 in noon, on scheduled and aspart ssi low dose.  eICU Interventions  -Lispro 5 units sq ordered  - follow CBG q4hr. Goal < 180     Intervention Category Intermediate Interventions: Hyperglycemia - evaluation and treatment  Elmer Sow 01/14/2020, 12:23 AM

## 2020-01-15 NOTE — Progress Notes (Signed)
Patient will be transferred for possible ECMO  Continue adequate sedation and paralytic  I did inform spouse about the transfer to [facility] for consideration for ECMO  Orders placed for transfer  Prognosis is guarded

## 2020-01-16 ENCOUNTER — Encounter (HOSPITAL_COMMUNITY): Payer: Self-pay | Admitting: Internal Medicine

## 2020-01-16 ENCOUNTER — Inpatient Hospital Stay (HOSPITAL_COMMUNITY): Payer: PRIVATE HEALTH INSURANCE

## 2020-01-16 DIAGNOSIS — U071 COVID-19: Secondary | ICD-10-CM | POA: Diagnosis not present

## 2020-01-16 DIAGNOSIS — J9601 Acute respiratory failure with hypoxia: Secondary | ICD-10-CM | POA: Diagnosis not present

## 2020-01-16 DIAGNOSIS — J9600 Acute respiratory failure, unspecified whether with hypoxia or hypercapnia: Secondary | ICD-10-CM | POA: Diagnosis not present

## 2020-01-16 LAB — POCT I-STAT 7, (LYTES, BLD GAS, ICA,H+H)
Acid-Base Excess: 10 mmol/L — ABNORMAL HIGH (ref 0.0–2.0)
Acid-Base Excess: 16 mmol/L — ABNORMAL HIGH (ref 0.0–2.0)
Acid-Base Excess: 12 mmol/L — ABNORMAL HIGH (ref 0.0–2.0)
Acid-Base Excess: 12 mmol/L — ABNORMAL HIGH (ref 0.0–2.0)
Acid-Base Excess: 9 mmol/L — ABNORMAL HIGH (ref 0.0–2.0)
Acid-Base Excess: 8 mmol/L — ABNORMAL HIGH (ref 0.0–2.0)
Acid-Base Excess: 8 mmol/L — ABNORMAL HIGH (ref 0.0–2.0)
Acid-Base Excess: 6 mmol/L — ABNORMAL HIGH (ref 0.0–2.0)
Acid-Base Excess: 7 mmol/L — ABNORMAL HIGH (ref 0.0–2.0)
Bicarbonate: 40.6 mmol/L — ABNORMAL HIGH (ref 20.0–28.0)
Bicarbonate: 42.4 mmol/L — ABNORMAL HIGH (ref 20.0–28.0)
Bicarbonate: 41 mmol/L — ABNORMAL HIGH (ref 20.0–28.0)
Bicarbonate: 36.7 mmol/L — ABNORMAL HIGH (ref 20.0–28.0)
Bicarbonate: 33.7 mmol/L — ABNORMAL HIGH (ref 20.0–28.0)
Bicarbonate: 32.2 mmol/L — ABNORMAL HIGH (ref 20.0–28.0)
Bicarbonate: 31.4 mmol/L — ABNORMAL HIGH (ref 20.0–28.0)
Bicarbonate: 30.4 mmol/L — ABNORMAL HIGH (ref 20.0–28.0)
Bicarbonate: 31.4 mmol/L — ABNORMAL HIGH (ref 20.0–28.0)
Calcium, Ion: 1.23 mmol/L (ref 1.15–1.40)
Calcium, Ion: 1.16 mmol/L (ref 1.15–1.40)
Calcium, Ion: 1.14 mmol/L — ABNORMAL LOW (ref 1.15–1.40)
Calcium, Ion: 1.09 mmol/L — ABNORMAL LOW (ref 1.15–1.40)
Calcium, Ion: 1.07 mmol/L — ABNORMAL LOW (ref 1.15–1.40)
Calcium, Ion: 1.12 mmol/L — ABNORMAL LOW (ref 1.15–1.40)
Calcium, Ion: 1.14 mmol/L — ABNORMAL LOW (ref 1.15–1.40)
Calcium, Ion: 1.11 mmol/L — ABNORMAL LOW (ref 1.15–1.40)
Calcium, Ion: 1.15 mmol/L (ref 1.15–1.40)
HCT: 41 % (ref 36.0–46.0)
HCT: 31 % — ABNORMAL LOW (ref 36.0–46.0)
HCT: 32 % — ABNORMAL LOW (ref 36.0–46.0)
HCT: 31 % — ABNORMAL LOW (ref 36.0–46.0)
HCT: 30 % — ABNORMAL LOW (ref 36.0–46.0)
HCT: 29 % — ABNORMAL LOW (ref 36.0–46.0)
HCT: 28 % — ABNORMAL LOW (ref 36.0–46.0)
HCT: 26 % — ABNORMAL LOW (ref 36.0–46.0)
HCT: 27 % — ABNORMAL LOW (ref 36.0–46.0)
Hemoglobin: 13.9 g/dL (ref 12.0–15.0)
Hemoglobin: 10.5 g/dL — ABNORMAL LOW (ref 12.0–15.0)
Hemoglobin: 10.9 g/dL — ABNORMAL LOW (ref 12.0–15.0)
Hemoglobin: 10.5 g/dL — ABNORMAL LOW (ref 12.0–15.0)
Hemoglobin: 10.2 g/dL — ABNORMAL LOW (ref 12.0–15.0)
Hemoglobin: 9.9 g/dL — ABNORMAL LOW (ref 12.0–15.0)
Hemoglobin: 9.5 g/dL — ABNORMAL LOW (ref 12.0–15.0)
Hemoglobin: 8.8 g/dL — ABNORMAL LOW (ref 12.0–15.0)
Hemoglobin: 9.2 g/dL — ABNORMAL LOW (ref 12.0–15.0)
O2 Saturation: 53 %
O2 Saturation: 91 %
O2 Saturation: 92 %
O2 Saturation: 92 %
O2 Saturation: 91 %
O2 Saturation: 91 %
O2 Saturation: 80 %
O2 Saturation: 76 %
O2 Saturation: 79 %
Patient temperature: 97
Patient temperature: 36.9
Patient temperature: 37.1
Patient temperature: 36.7
Patient temperature: 36.7
Patient temperature: 36.8
Patient temperature: 36.5
Patient temperature: 36.3
Patient temperature: 36.2
Potassium: 5.8 mmol/L — ABNORMAL HIGH (ref 3.5–5.1)
Potassium: 5.1 mmol/L (ref 3.5–5.1)
Potassium: 5.9 mmol/L — ABNORMAL HIGH (ref 3.5–5.1)
Potassium: 5.6 mmol/L — ABNORMAL HIGH (ref 3.5–5.1)
Potassium: 5.6 mmol/L — ABNORMAL HIGH (ref 3.5–5.1)
Potassium: 5.1 mmol/L (ref 3.5–5.1)
Potassium: 4.9 mmol/L (ref 3.5–5.1)
Potassium: 4.4 mmol/L (ref 3.5–5.1)
Potassium: 4.3 mmol/L (ref 3.5–5.1)
Sodium: 151 mmol/L — ABNORMAL HIGH (ref 135–145)
Sodium: 155 mmol/L — ABNORMAL HIGH (ref 135–145)
Sodium: 154 mmol/L — ABNORMAL HIGH (ref 135–145)
Sodium: 154 mmol/L — ABNORMAL HIGH (ref 135–145)
Sodium: 152 mmol/L — ABNORMAL HIGH (ref 135–145)
Sodium: 152 mmol/L — ABNORMAL HIGH (ref 135–145)
Sodium: 150 mmol/L — ABNORMAL HIGH (ref 135–145)
Sodium: 150 mmol/L — ABNORMAL HIGH (ref 135–145)
Sodium: 151 mmol/L — ABNORMAL HIGH (ref 135–145)
TCO2: 43 mmol/L — ABNORMAL HIGH (ref 22–32)
TCO2: 44 mmol/L — ABNORMAL HIGH (ref 22–32)
TCO2: 44 mmol/L — ABNORMAL HIGH (ref 22–32)
TCO2: 38 mmol/L — ABNORMAL HIGH (ref 22–32)
TCO2: 35 mmol/L — ABNORMAL HIGH (ref 22–32)
TCO2: 33 mmol/L — ABNORMAL HIGH (ref 22–32)
TCO2: 33 mmol/L — ABNORMAL HIGH (ref 22–32)
TCO2: 32 mmol/L (ref 22–32)
TCO2: 33 mmol/L — ABNORMAL HIGH (ref 22–32)
pCO2 arterial: 82.9 mmHg (ref 32.0–48.0)
pCO2 arterial: 59.3 mmHg — ABNORMAL HIGH (ref 32.0–48.0)
pCO2 arterial: 84.6 mmHg (ref 32.0–48.0)
pCO2 arterial: 49.1 mmHg — ABNORMAL HIGH (ref 32.0–48.0)
pCO2 arterial: 42.9 mmHg (ref 32.0–48.0)
pCO2 arterial: 41.3 mmHg (ref 32.0–48.0)
pCO2 arterial: 38.9 mmHg (ref 32.0–48.0)
pCO2 arterial: 39.9 mmHg (ref 32.0–48.0)
pCO2 arterial: 42.1 mmHg (ref 32.0–48.0)
pH, Arterial: 7.294 — ABNORMAL LOW (ref 7.350–7.450)
pH, Arterial: 7.461 — ABNORMAL HIGH (ref 7.350–7.450)
pH, Arterial: 7.294 — ABNORMAL LOW (ref 7.350–7.450)
pH, Arterial: 7.48 — ABNORMAL HIGH (ref 7.350–7.450)
pH, Arterial: 7.502 — ABNORMAL HIGH (ref 7.350–7.450)
pH, Arterial: 7.499 — ABNORMAL HIGH (ref 7.350–7.450)
pH, Arterial: 7.513 — ABNORMAL HIGH (ref 7.350–7.450)
pH, Arterial: 7.488 — ABNORMAL HIGH (ref 7.350–7.450)
pH, Arterial: 7.478 — ABNORMAL HIGH (ref 7.350–7.450)
pO2, Arterial: 32 mmHg — CL (ref 83.0–108.0)
pO2, Arterial: 60 mmHg — ABNORMAL LOW (ref 83.0–108.0)
pO2, Arterial: 76 mmHg — ABNORMAL LOW (ref 83.0–108.0)
pO2, Arterial: 59 mmHg — ABNORMAL LOW (ref 83.0–108.0)
pO2, Arterial: 54 mmHg — ABNORMAL LOW (ref 83.0–108.0)
pO2, Arterial: 54 mmHg — ABNORMAL LOW (ref 83.0–108.0)
pO2, Arterial: 38 mmHg — CL (ref 83.0–108.0)
pO2, Arterial: 37 mmHg — CL (ref 83.0–108.0)
pO2, Arterial: 38 mmHg — CL (ref 83.0–108.0)

## 2020-01-16 LAB — GLUCOSE, CAPILLARY
Glucose-Capillary: 132 mg/dL — ABNORMAL HIGH (ref 70–99)
Glucose-Capillary: 137 mg/dL — ABNORMAL HIGH (ref 70–99)
Glucose-Capillary: 147 mg/dL — ABNORMAL HIGH (ref 70–99)
Glucose-Capillary: 154 mg/dL — ABNORMAL HIGH (ref 70–99)
Glucose-Capillary: 157 mg/dL — ABNORMAL HIGH (ref 70–99)
Glucose-Capillary: 158 mg/dL — ABNORMAL HIGH (ref 70–99)
Glucose-Capillary: 165 mg/dL — ABNORMAL HIGH (ref 70–99)
Glucose-Capillary: 186 mg/dL — ABNORMAL HIGH (ref 70–99)
Glucose-Capillary: 198 mg/dL — ABNORMAL HIGH (ref 70–99)
Glucose-Capillary: 234 mg/dL — ABNORMAL HIGH (ref 70–99)
Glucose-Capillary: 249 mg/dL — ABNORMAL HIGH (ref 70–99)
Glucose-Capillary: 371 mg/dL — ABNORMAL HIGH (ref 70–99)
Glucose-Capillary: 382 mg/dL — ABNORMAL HIGH (ref 70–99)
Glucose-Capillary: 403 mg/dL — ABNORMAL HIGH (ref 70–99)

## 2020-01-16 LAB — BASIC METABOLIC PANEL
Anion gap: 7 (ref 5–15)
Anion gap: 9 (ref 5–15)
BUN: 84 mg/dL — ABNORMAL HIGH (ref 6–20)
BUN: 94 mg/dL — ABNORMAL HIGH (ref 6–20)
CO2: 34 mmol/L — ABNORMAL HIGH (ref 22–32)
CO2: 32 mmol/L (ref 22–32)
Calcium: 8.1 mg/dL — ABNORMAL LOW (ref 8.9–10.3)
Calcium: 8.1 mg/dL — ABNORMAL LOW (ref 8.9–10.3)
Chloride: 113 mmol/L — ABNORMAL HIGH (ref 98–111)
Chloride: 111 mmol/L (ref 98–111)
Creatinine, Ser: 1.07 mg/dL — ABNORMAL HIGH (ref 0.44–1.00)
Creatinine, Ser: 1.34 mg/dL — ABNORMAL HIGH (ref 0.44–1.00)
GFR calc Af Amer: >60 mL/min (ref >60)
GFR calc Af Amer: 52 mL/min — ABNORMAL LOW (ref >60)
GFR calc non Af Amer: 58 mL/min — ABNORMAL LOW (ref >60)
GFR calc non Af Amer: 44 mL/min — ABNORMAL LOW (ref >60)
Glucose, Bld: 181 mg/dL — ABNORMAL HIGH (ref 70–99)
Glucose, Bld: 314 mg/dL — ABNORMAL HIGH (ref 70–99)
Potassium: 5.7 mmol/L — ABNORMAL HIGH (ref 3.5–5.1)
Potassium: 5.2 mmol/L — ABNORMAL HIGH (ref 3.5–5.1)
Sodium: 154 mmol/L — ABNORMAL HIGH (ref 135–145)
Sodium: 152 mmol/L — ABNORMAL HIGH (ref 135–145)

## 2020-01-16 LAB — COMPREHENSIVE METABOLIC PANEL
ALT: 64 U/L — ABNORMAL HIGH (ref 0–44)
AST: 54 U/L — ABNORMAL HIGH (ref 15–41)
Albumin: 2.6 g/dL — ABNORMAL LOW (ref 3.5–5.0)
Alkaline Phosphatase: 81 U/L (ref 38–126)
Anion gap: 8 (ref 5–15)
BUN: 72 mg/dL — ABNORMAL HIGH (ref 6–20)
CO2: 35 mmol/L — ABNORMAL HIGH (ref 22–32)
Calcium: 8.4 mg/dL — ABNORMAL LOW (ref 8.9–10.3)
Chloride: 112 mmol/L — ABNORMAL HIGH (ref 98–111)
Creatinine, Ser: 0.74 mg/dL (ref 0.44–1.00)
GFR calc Af Amer: >60 mL/min (ref >60)
GFR calc non Af Amer: >60 mL/min (ref >60)
Glucose, Bld: 168 mg/dL — ABNORMAL HIGH (ref 70–99)
Potassium: 4.3 mmol/L (ref 3.5–5.1)
Sodium: 155 mmol/L — ABNORMAL HIGH (ref 135–145)
Total Bilirubin: 0.4 mg/dL (ref 0.3–1.2)
Total Protein: 4.5 g/dL — ABNORMAL LOW (ref 6.5–8.1)

## 2020-01-16 LAB — PHOSPHORUS
Phosphorus: 2.3 mg/dL — ABNORMAL LOW (ref 2.5–4.6)
Phosphorus: 4.7 mg/dL — ABNORMAL HIGH (ref 2.5–4.6)

## 2020-01-16 LAB — HEPATIC FUNCTION PANEL
ALT: 64 U/L — ABNORMAL HIGH (ref 0–44)
AST: 54 U/L — ABNORMAL HIGH (ref 15–41)
Albumin: 2.7 g/dL — ABNORMAL LOW (ref 3.5–5.0)
Alkaline Phosphatase: 80 U/L (ref 38–126)
Bilirubin, Direct: 0.2 mg/dL (ref 0.0–0.2)
Indirect Bilirubin: 0.4 mg/dL (ref 0.3–0.9)
Total Bilirubin: 0.6 mg/dL (ref 0.3–1.2)
Total Protein: 4.6 g/dL — ABNORMAL LOW (ref 6.5–8.1)

## 2020-01-16 LAB — CBC
HCT: 35.5 % — ABNORMAL LOW (ref 36.0–46.0)
HCT: 37 % (ref 36.0–46.0)
HCT: 35.5 % — ABNORMAL LOW (ref 36.0–46.0)
HCT: 31.4 % — ABNORMAL LOW (ref 36.0–46.0)
Hemoglobin: 10.4 g/dL — ABNORMAL LOW (ref 12.0–15.0)
Hemoglobin: 10.7 g/dL — ABNORMAL LOW (ref 12.0–15.0)
Hemoglobin: 10.1 g/dL — ABNORMAL LOW (ref 12.0–15.0)
Hemoglobin: 9.3 g/dL — ABNORMAL LOW (ref 12.0–15.0)
MCH: 28.1 pg (ref 26.0–34.0)
MCH: 28.6 pg (ref 26.0–34.0)
MCH: 28 pg (ref 26.0–34.0)
MCH: 28.4 pg (ref 26.0–34.0)
MCHC: 29.3 g/dL — ABNORMAL LOW (ref 30.0–36.0)
MCHC: 28.9 g/dL — ABNORMAL LOW (ref 30.0–36.0)
MCHC: 28.5 g/dL — ABNORMAL LOW (ref 30.0–36.0)
MCHC: 29.6 g/dL — ABNORMAL LOW (ref 30.0–36.0)
MCV: 95.9 fL (ref 80.0–100.0)
MCV: 98.9 fL (ref 80.0–100.0)
MCV: 98.3 fL (ref 80.0–100.0)
MCV: 95.7 fL (ref 80.0–100.0)
Platelets: 107 10*3/uL — ABNORMAL LOW (ref 150–400)
Platelets: 127 10*3/uL — ABNORMAL LOW (ref 150–400)
Platelets: 99 10*3/uL — ABNORMAL LOW (ref 150–400)
Platelets: 95 10*3/uL — ABNORMAL LOW (ref 150–400)
RBC: 3.7 MIL/uL — ABNORMAL LOW (ref 3.87–5.11)
RBC: 3.74 MIL/uL — ABNORMAL LOW (ref 3.87–5.11)
RBC: 3.61 MIL/uL — ABNORMAL LOW (ref 3.87–5.11)
RBC: 3.28 MIL/uL — ABNORMAL LOW (ref 3.87–5.11)
RDW: 14.5 % (ref 11.5–15.5)
RDW: 14.9 % (ref 11.5–15.5)
RDW: 15 % (ref 11.5–15.5)
RDW: 14.9 % (ref 11.5–15.5)
WBC: 5.4 10*3/uL (ref 4.0–10.5)
WBC: 11.4 10*3/uL — ABNORMAL HIGH (ref 4.0–10.5)
WBC: 7.2 10*3/uL (ref 4.0–10.5)
WBC: 7.2 10*3/uL (ref 4.0–10.5)
nRBC: 0.6 % — ABNORMAL HIGH (ref 0.0–0.2)
nRBC: 0.9 % — ABNORMAL HIGH (ref 0.0–0.2)
nRBC: 0.8 % — ABNORMAL HIGH (ref 0.0–0.2)
nRBC: 0.8 % — ABNORMAL HIGH (ref 0.0–0.2)

## 2020-01-16 LAB — ECHOCARDIOGRAM COMPLETE
Height: 64 in
Weight: 4698.44 [oz_av]

## 2020-01-16 LAB — APTT
aPTT: 50 seconds — ABNORMAL HIGH (ref 24–36)
aPTT: 51 seconds — ABNORMAL HIGH (ref 24–36)
aPTT: 53 seconds — ABNORMAL HIGH (ref 24–36)
aPTT: 57 seconds — ABNORMAL HIGH (ref 24–36)
aPTT: 62 seconds — ABNORMAL HIGH (ref 24–36)

## 2020-01-16 LAB — BLOOD GAS, ARTERIAL
Acid-Base Excess: 6.9 mmol/L — ABNORMAL HIGH (ref 0.0–2.0)
Bicarbonate: 38.3 mmol/L — ABNORMAL HIGH (ref 20.0–28.0)
Drawn by: 4008311
FIO2: 100
O2 Saturation: 87.7 %
Patient temperature: 37
pCO2 arterial: >120 mmHg (ref 32.0–48.0)
pH, Arterial: 6.994 — CL (ref 7.350–7.450)
pO2, Arterial: 81.1 mmHg — ABNORMAL LOW (ref 83.0–108.0)

## 2020-01-16 LAB — PROTIME-INR
INR: 1.6 — ABNORMAL HIGH (ref 0.8–1.2)
Prothrombin Time: 18.4 seconds — ABNORMAL HIGH (ref 11.4–15.2)

## 2020-01-16 LAB — LACTATE DEHYDROGENASE: LDH: 543 U/L — ABNORMAL HIGH (ref 98–192)

## 2020-01-16 LAB — POCT ACTIVATED CLOTTING TIME: Activated Clotting Time: 318 s

## 2020-01-16 LAB — MAGNESIUM
Magnesium: 3.1 mg/dL — ABNORMAL HIGH (ref 1.7–2.4)
Magnesium: 2.9 mg/dL — ABNORMAL HIGH (ref 1.7–2.4)

## 2020-01-16 LAB — LACTIC ACID, PLASMA
Lactic Acid, Venous: 2.5 mmol/L (ref 0.5–1.9)
Lactic Acid, Venous: 1.7 mmol/L (ref 0.5–1.9)

## 2020-01-16 LAB — FIBRINOGEN: Fibrinogen: 216 mg/dL (ref 210–475)

## 2020-01-16 MED ORDER — INSULIN DETEMIR 100 UNIT/ML ~~LOC~~ SOLN
30.0000 [IU] | Freq: Two times a day (BID) | SUBCUTANEOUS | Status: DC
  Administered 2020-01-16: 30 [IU] via SUBCUTANEOUS
  Filled 2020-01-16 (×2): qty 0.3

## 2020-01-16 MED ORDER — CLONIDINE HCL 0.2 MG PO TABS: 0.2000 mg | ORAL_TABLET | Freq: Three times a day (TID) | ORAL | Status: DC

## 2020-01-16 MED ORDER — CLONIDINE ORAL SUSPENSION 10 MCG/ML
0.2000 mg | Freq: Three times a day (TID) | ORAL | Status: DC
  Filled 2020-01-16: qty 20

## 2020-01-16 MED ORDER — INSULIN ASPART 100 UNIT/ML ~~LOC~~ SOLN
0.0000 [IU] | SUBCUTANEOUS | Status: DC
  Administered 2020-01-16 (×2): 20 [IU] via SUBCUTANEOUS
  Administered 2020-01-16: 7 [IU] via SUBCUTANEOUS
  Administered 2020-01-16: 4 [IU] via SUBCUTANEOUS
  Administered 2020-01-17: 15 [IU] via SUBCUTANEOUS
  Administered 2020-01-17 (×2): 11 [IU] via SUBCUTANEOUS
  Administered 2020-01-17: 15 [IU] via SUBCUTANEOUS
  Administered 2020-01-17: 17 [IU] via SUBCUTANEOUS
  Administered 2020-01-17: 15 [IU] via SUBCUTANEOUS
  Administered 2020-01-18: 20 [IU] via SUBCUTANEOUS
  Administered 2020-01-18: 7 [IU] via SUBCUTANEOUS
  Administered 2020-01-18: 15 [IU] via SUBCUTANEOUS
  Administered 2020-01-18 (×3): 11 [IU] via SUBCUTANEOUS
  Administered 2020-01-19 (×2): 20 [IU] via SUBCUTANEOUS

## 2020-01-16 MED ORDER — NICARDIPINE HCL IN NACL 20-0.86 MG/200ML-% IV SOLN
3.0000 mg/h | INTRAVENOUS | Status: DC
  Administered 2020-01-16: 3 mg/h via INTRAVENOUS
  Filled 2020-01-16: qty 200

## 2020-01-16 MED ORDER — DEXTROSE-NACL 5-0.45 % IV SOLN: Freq: Once | INTRAVENOUS | Status: AC

## 2020-01-16 MED ORDER — FENTANYL CITRATE (PF) 2500 MCG/50ML IJ SOLN
0.0000 ug/h | Status: DC
  Administered 2020-01-16 – 2020-01-17 (×3): 300 ug/h via INTRAVENOUS
  Administered 2020-01-18: 250 ug/h via INTRAVENOUS
  Administered 2020-01-20: 350 ug/h via INTRAVENOUS
  Administered 2020-01-20: 250 ug/h via INTRAVENOUS
  Administered 2020-01-22: 400 ug/h via INTRAVENOUS
  Administered 2020-01-24: 100 ug/h via INTRAVENOUS
  Administered 2020-01-26: 75 ug/h via INTRAVENOUS
  Filled 2020-01-16 (×19): qty 100

## 2020-01-16 MED ORDER — INSULIN DETEMIR 100 UNIT/ML ~~LOC~~ SOLN
40.0000 [IU] | Freq: Two times a day (BID) | SUBCUTANEOUS | Status: DC
  Administered 2020-01-16: 40 [IU] via SUBCUTANEOUS
  Filled 2020-01-16 (×2): qty 0.4

## 2020-01-16 MED ORDER — FREE WATER
200.0000 mL | Status: DC
  Administered 2020-01-16 – 2020-01-18 (×24): 200 mL

## 2020-01-16 MED ORDER — ALBUMIN HUMAN 5 % IV SOLN
12.5000 g | Freq: Once | INTRAVENOUS | Status: AC
  Administered 2020-01-16: 12.5 g via INTRAVENOUS

## 2020-01-16 MED ORDER — INSULIN ASPART 100 UNIT/ML ~~LOC~~ SOLN
3.0000 [IU] | SUBCUTANEOUS | Status: DC
  Administered 2020-01-16 – 2020-01-17 (×3): 3 [IU] via SUBCUTANEOUS

## 2020-01-16 MED ORDER — NOREPINEPHRINE 16 MG/250ML-% IV SOLN
0.0000 ug/min | INTRAVENOUS | Status: DC
  Administered 2020-01-16: 0.5 ug/min via INTRAVENOUS
  Administered 2020-01-21: 1 ug/min via INTRAVENOUS
  Administered 2020-01-30: 10 ug/min via INTRAVENOUS
  Administered 2020-01-31: 14 ug/min via INTRAVENOUS
  Administered 2020-02-01: 16 ug/min via INTRAVENOUS
  Administered 2020-02-01: 17 ug/min via INTRAVENOUS
  Administered 2020-02-03: 2 ug/min via INTRAVENOUS
  Administered 2020-02-08: 8 ug/min via INTRAVENOUS
  Filled 2020-01-16 (×12): qty 250

## 2020-01-16 MED ORDER — PIVOT 1.5 CAL PO LIQD
1000.0000 mL | ORAL | Status: DC
  Administered 2020-01-16 – 2020-01-19 (×3): 1000 mL
  Filled 2020-01-16 (×6): qty 1000

## 2020-01-16 MED ORDER — ROCURONIUM BROMIDE 10 MG/ML (PF) SYRINGE
100.0000 mg | PREFILLED_SYRINGE | Freq: Once | INTRAVENOUS | Status: AC
  Administered 2020-01-16: 100 mg via INTRAVENOUS

## 2020-01-16 MED ORDER — JUVEN PO PACK
1.0000 | PACK | Freq: Two times a day (BID) | ORAL | Status: DC
  Administered 2020-01-16 – 2020-01-30 (×22): 1
  Filled 2020-01-16 (×24): qty 1

## 2020-01-16 MED ORDER — ALBUMIN HUMAN 5 % IV SOLN
INTRAVENOUS | Status: AC
  Administered 2020-01-16: 12.5 g
  Filled 2020-01-16: qty 250

## 2020-01-16 MED ORDER — PROSOURCE TF PO LIQD
45.0000 mL | Freq: Every day | ORAL | Status: DC
  Administered 2020-01-16 – 2020-01-19 (×3): 45 mL
  Filled 2020-01-16 (×2): qty 45

## 2020-01-16 MED ORDER — ALBUMIN HUMAN 5 % IV SOLN
12.5000 g | INTRAVENOUS | Status: DC | PRN
  Administered 2020-01-16 – 2020-03-03 (×44): 12.5 g via INTRAVENOUS
  Filled 2020-01-16 (×40): qty 250

## 2020-01-16 MED ORDER — ACETAZOLAMIDE 250 MG PO TABS
250.0000 mg | ORAL_TABLET | Freq: Two times a day (BID) | ORAL | Status: AC
  Administered 2020-01-16 (×2): 250 mg
  Filled 2020-01-16 (×2): qty 1

## 2020-01-16 MED ORDER — ROCURONIUM BROMIDE 10 MG/ML (PF) SYRINGE
PREFILLED_SYRINGE | INTRAVENOUS | Status: AC
  Filled 2020-01-16: qty 10

## 2020-01-16 MED ORDER — SODIUM ZIRCONIUM CYCLOSILICATE 10 G PO PACK
10.0000 g | PACK | Freq: Once | ORAL | Status: AC
  Administered 2020-01-16: 10 g
  Filled 2020-01-16: qty 1

## 2020-01-16 MED ORDER — CLONIDINE HCL 0.2 MG PO TABS: 0.2000 mg | ORAL_TABLET | Freq: Once | ORAL | Status: AC

## 2020-01-16 NOTE — Progress Notes (Signed)
Bensimhon MD notified of critical pH and pCO2 on ABG.

## 2020-01-16 NOTE — Progress Notes (Signed)
[place] for bivalirudin Indication: VV ECMO  Bivalirudin started at 0.05 mg/kg/hr when aPTT 57sec.  APTT now 51 sec.  Goal aPTT 50-80 sec Will increase bival to 0.06 mg/kg/hr and check aPTT in 4 hours  Excell Seltzer, PharmD Clinical Pharmacist 01/16/2020 5:03 AM

## 2020-01-16 NOTE — Progress Notes (Signed)
ANTICOAGULATION CONSULT NOTE - Initial Consult  Pharmacy Consult for bivalirudin Indication: VV ECMO  Allergies  Allergen Reactions  . Sulfa Antibiotics Rash and Other (See Comments)    Patient Measurements: Height: 5\' 4"  (162.6 cm) Weight: 133.2 kg (293 lb 10.4 oz) (one pillow, one blanket) IBW/kg (Calculated) : 54.7 Heparin Dosing Weight: 87kg  Vital Signs: Temp: 97.9 F (36.6 C) (08/10 1234) Temp Source: Bladder (08/10 1000) BP: 121/60 (08/10 1234) Pulse Rate: 90 (08/10 1234)  Labs: Recent Labs    02/01/2020 0356 02/01/2020 0356 01/27/2020 2023 01/10/2020 2035 02/03/2020 2053 02/02/2020 2324 01/16/20 0012 01/16/20 0335 01/16/20 0335 01/16/20 0341 01/16/20 0343 01/16/20 0356 01/16/20 0558 01/16/20 1100  HGB 13.1   < >  --  11.2*   < >   < >  --  10.9*   < > 10.4*  --   --  10.5*  --   HCT 44.5   < >  --  37.6   < >   < >  --  32.0*  --  35.5*  --   --  31.0*  --   PLT 150  --   --  99*  --   --   --   --   --  107*  --   --   --   --   APTT  --   --   --  >200*   < >  --  57*  --   --   --   --  51*  --  50*  LABPROT  --   --   --  17.5*  --   --   --   --   --   --   --  18.4*  --   --   INR  --   --   --  1.5*  --   --   --   --   --   --   --  1.6*  --   --   HEPARINUNFRC  --   --  1.06*  --   --   --   --   --   --   --   --   --   --   --   CREATININE 0.77   < >  --  0.85  --   --   --   --   --   --  0.74  --   --  1.07*   < > = values in this interval not displayed.    Estimated Creatinine Clearance: 80.7 mL/min (A) (by C-G formula based on SCr of 1.07 mg/dL (H)).   Medical History: Past Medical History:  Diagnosis Date  . Asthma   . Diabetes mellitus without complication ([place])   . Diverticulitis   . Gallstones   . IBS (irritable bowel syndrome)   . NAFLD (nonalcoholic fatty liver disease)    CT scan 2015  . Ovarian cyst     Assessment: 54 year old female admitted with COVID s/p treatment with antivirals completed 8/6. Now with continued low  saturations and decision was made to transfer to [facility] for ECMO cannulation and continue with VV ECMO.   APTT came back therapeutic at 50 on lower end of the end, on 0.06 mg/kg/hr. Hgb 10.5, plt 107. Fibrinogen 216. LDH 543. No s/sx of bleeding or infusion issues.    Goal of Therapy:  aPTT 50-80 seconds Monitor platelets by anticoagulation protocol: Yes   Plan:  Increase bivalirudin to 0.07 mg/kg/hr Aptt in 4 hours Will plan for 0500/1700 aptt checks once dose is stable  Antonietta Jewel, PharmD, BCCCP Clinical Pharmacist  Phone: [phone] 01/16/2020 1:20 PM  Please check AMION for all [place] phone numbers After 10:00 PM, call [place] [phone]

## 2020-01-16 NOTE — Op Note (Signed)
Procedure(s): ECMO CANNULATION Procedure Note  Alisha Stephenson female 54 y.o. 01/16/2020  Procedure(s) and Anesthesia Type:    * ECMO CANNULATION - General  Surgeon(s) and Role:    * Bensimhon, Shaune Pascal, MD - Primary    * Wonda Olds, MD   Indications: The patient was admitted to [facility] with greater than one week h/o COVID respiratory illness. She has required mechanical ventilation for past 5 days. Her status has deteriorated over the past 24 hours, and consultation is obtained for consideration of V-V ecmo. She is brought directly from [facility] to the [facility] cath lab for cannulation.     Surgeon: Wonda Olds  Co-Surgeon: D. Bensimhon, MD   Assistants: staff  Anesthesia: General endotracheal anesthesia  ASA Class: 5    Procedure Detail  ECMO CANNULATION After informed consent, she is placed on the fluoroscopy table in the cath lab in the supine position. The arms are tucked at the side. The right neck and upper chest are cleansed and draped sterilely. A preoperative pause is performed. Percutaneous access of the right IJ vein is obtained using ultrasound guidance. A long wire is placed from the RIJ into the IVC. Using serial dilators, a generous tract is created subcutaneously to allow passage of a 32 Fr Crescent multichannel cannula. Prior to placing the cannula, 10,000 units of unfractionated heparin is delivered intravenously. The cannula is then inserted by modified Seldinger technique and under fluoroscopic guidance to gauge proper cannula positioning. The inflow and outflow ports on the cannula are deaired and attached to the respective limbs of the V-V ecmo circuit. Once the ACT is confirmed adequate, flows are initiated. Peripheral saturations are noted to improve immediately. The cannula is secured with #1 silk sutures. Sterile dressings are applied.   Estimated Blood Loss:  300 mL         Drains: no          Blood Given: none          Specimens: no          Implants: none        Complications:  * No complications entered in OR log *         Disposition: ICU - intubated and critically ill.         Condition: stable

## 2020-01-16 NOTE — CV Procedure (Signed)
ECMO NOTE:  Indication: Respiratory failure due to COVID PNA  Initial cannulation date: 01/10/2020  ECMO type: VV ECMO (Centrimar with oximizer)  Dual lumen Inflow/return cannula:  1) 32 FR RIJ Crescent placed 01/24/2020  ECMO events:  Initial cannulation 01/14/2020   Daily data:  Flow 4.5 RPM 4500 Sweep 4.5  Labs:  7.46/59/60/91% Hgb10.4 LDH 608 (pre-cannulation) -> 558 -> 543 PTT on bival = 0.51 Lactic acid 2.7 -> 2.5  Plan:  Continue ECMO support. More cyanotic this am but improved with repositioning and adjustment of Sweep.  Continue lung rest on vent.  Start diamox Wean paralytic Likely trach soon.  Discussed in multidisciplinary fashion on ECMO rounds with TCTS, CCM, ECMO coordinator/specialist, RT, PharmD and nursing staff all present.   Glori Bickers, MD  10:08 AM

## 2020-01-16 NOTE — Progress Notes (Signed)
NAME:  Alisha Stephenson, MRN:  [id number], DOB:  [birth year], LOS: 16 ADMISSION DATE:  01/17/2020, CONSULTATION DATE:  8/3 REFERRING MD:  Alfredia Ferguson, CHIEF COMPLAINT:  Dyspnea   Brief History   54 y/o female admitted on 8/2 with severe acute respiratory failure with hypoxemia due to COVID 19 pneumonia.  She developed symptoms 1 week prior to admission.  Past Medical History  DM2 Diverticulitis Gallstones Ovarian cyst NAFLD Asthma  Significant Hospital Events   8/2 admit 8/3 ICU transfer, intubated 8/4 prone, paralyze 8/9 significant desaturations today  Consults:  PCCM  Procedures:  8/3 ETT >  8/3 PICC >   Significant Diagnostic Tests:  7/31 CT head > NAICP 7/31 MRI/MRA brain > no acute changes, possibly small aneurysm ACOM  Micro Data:  8/2 blood > NG 8/2 SARS COV 2 > positive 8/4 resp > negative 8/4 urine >   Antimicrobials:  8/2 remdesivir > 8/6 8/2 actemra  8/2 solumedrol >   8/3 ceftriaxone >  8/5 8/3 azithro >  8/5  Interim history/subjective:  8/10: started on VV ecmo overnight, ruddy appearance in face, triggering vent despite bis and paralysis. Appears to be triggering 2/2 flows. Giving dose of acetazolamide. Minimize carrier fluids with NS (utilize sterile water). Transitioning to sq insulin. Attempting to wean paralytic today as well.     Objective   Blood pressure 121/60, pulse 90, temperature 97.9 F (36.6 C), resp. rate 10, height 5\' 4"  (1.626 m), weight 133.2 kg, SpO2 99 %. CVP:  [5 mmHg-18 mmHg] 11 mmHg  Vent Mode: PCV FiO2 (%):  [100 %] 100 % Set Rate:  [10 bmp-34 bmp] 10 bmp Vt Set:  [350 mL] 350 mL PEEP:  [10 cmH20-22 cmH20] 12 cmH20 Plateau Pressure:  [20 cmH20-36 cmH20] 20 cmH20   Intake/Output Summary (Last 24 hours) at 01/16/2020 1255 Last data filed at 01/16/2020 1100 Gross per 24 hour  Intake 3407.17 ml  Output 1885 ml  Net 1522.17 ml   Filed Weights   01/07/2020 0500 01/24/2020 2135 01/16/20 0500  Weight: 130.5 kg 130.5 kg 133.2 kg     Examination:  General: Critically ill-appearing woman, ruddy in face, with ett in place and vv ecmo cannula HENT: NCAT, ruddy face, Anicteric PULM: minimal lung sounds CV: Sinus tachycardia on telemetry GI: Soft, obese, nondistended MSK: Appropriate muscle tone for age, pitting edema bilateral lower extremities Neuro: RASS -5,sedated and paralyzed  Resolved Hospital Problem list     Assessment & Plan:   ARDS due to COVID 19 pneumonia > worsening oxygenation again on 8/6, bilateral effusions, presumed acute pulmonary edema -lung protective ventilation while on vv ecmo flow 4.5 RPM 4500 sweep 4 -Goal net negative fluid balance as able -dose acetazolamide for alkalosis on bmp -Continue NMB PRN for significant dyssynchrony causing desaturation or other hemodynamic instability. -Continue Solu-Medrol per protocol; previously completed remdesivir per protocol.  Tocilizumab given on 8/2. -Goal RASS -4 to -5 -VAP prevention protocol -Continue to monitor off antibiotics; no clinical evidence of bacterial pneumonia -Will consider continuous paralysis if she continues to be dyssynchronous, but attempting to wean today -will attempt reposition of pt first for ruddy facial appearance otherwise concern for inadequate drainage/svc syndrome, may req smaller cannula vs repositioning of cannula.   Need for sedation for mechanical ventilation Ventilator dyssynchrony  -Continue heavy sedation until able to wean paralytic -Continue fentanyl/versed infusion -Continue oxycodone and clonazepam  AKI> improved -Continue to monitor daily -goal of euvolemia -Electrolyte repletion as needed  Hyperglycemia > not controlled -Goal  BG 140-180 while admitted to the ICU -changing insulin regimen esp in light of starting tf.    Best practice:  Diet: tube feeding Pain/Anxiety/Delirium protocol (if indicated): as above VAP protocol (if indicated): yes DVT prophylaxis: lovenox GI prophylaxis:  famotidine Glucose control: Basal bolus insulin Mobility: bed rest Code Status: full Family Communication: updated family  Disposition: ICU  Labs   CBC: Recent Labs  Lab 01/10/20 0346 01/10/20 1212 01/11/20 0312 01/11/20 0312 01/12/20 0213 01/12/20 0213 01/13/20 0500 01/13/20 0500 01/12/2020 0356 01/07/2020 0356 01/30/2020 2035 01/14/2020 2035 01/11/2020 2053 01/13/2020 2324 01/16/20 0335 01/16/20 0341 01/16/20 0558  WBC 7.0  --  5.9   < > 6.2  --  6.2  --  4.1  --  5.7  --   --   --   --  5.4  --   NEUTROABS 6.0  --  4.9  --  4.9  --  4.9  --   --   --   --   --   --   --   --   --   --   HGB 13.7   < > 12.9   < > 12.6   < > 12.8   < > 13.1   < > 11.2*   < > 10.9* 10.5* 10.9* 10.4* 10.5*  HCT 44.5   < > 40.1   < > 40.0   < > 42.4   < > 44.5   < > 37.6   < > 32.0* 31.0* 32.0* 35.5* 31.0*  MCV 93.3  --  91.6   < > 93.0  --  94.2  --  96.9  --  96.4  --   --   --   --  95.9  --   PLT 168  --  142*   < > 147*  --  132*  --  150  --  99*  --   --   --   --  107*  --    < > = values in this interval not displayed.    Basic Metabolic Panel: Recent Labs  Lab 01/10/20 0346 01/10/20 0346 01/10/20 1212 01/10/20 1811 01/11/20 0312 01/12/20 0213 01/13/20 0500 01/13/20 0500 01/14/20 0805 01/14/20 0805 01/11/2020 0356 01/13/2020 0356 01/14/2020 2035 02/06/2020 2035 02/05/2020 2053 02/03/2020 2324 01/16/20 0335 01/16/20 0343 01/16/20 0558  NA 137   < >   < >  --  137   < > 145   < > 142   < > 146*   < > 147*   < > 150* 151* 152* 155* 155*  K 3.7   < >   < >  --  3.5   < > 3.8   < > 3.6   < > 4.8   < > 5.4*   < > 6.2* 5.4* 6.9* 4.3 5.1  CL 99   < >  --   --  103   < > 108  --  101  --  106  --  105  --   --   --   --  112*  --   CO2 25   < >  --   --  26   < > 28  --  32  --  32  --  31  --   --   --   --  35*  --   GLUCOSE 412*   < >  --   --  271*   < >  259*  --  145*  --  321*  --  376*  --   --   --   --  168*  --   BUN 43*   < >  --   --  54*   < > 77*  --  69*  --  73*  --  71*  --   --    --   --  72*  --   CREATININE 1.61*   < >  --   --  1.38*   < > 0.99  --  0.73  --  0.77  --  0.85  --   --   --   --  0.74  --   CALCIUM 7.6*   < >  --   --  6.5*   < > 7.0*  --  7.5*  --  7.6*  --  7.7*  --   --   --   --  8.4*  --   MG 2.3  --   --  2.1 2.2  --   --   --   --   --   --   --   --   --   --   --   --  3.1*  --   PHOS 4.0  --   --  5.1* 5.2*  --   --   --   --   --   --   --   --   --   --   --   --  2.3*  --    < > = values in this interval not displayed.   GFR: Estimated Creatinine Clearance: 108 mL/min (by C-G formula based on SCr of 0.74 mg/dL). Recent Labs  Lab 01/10/20 0346 01/10/20 0346 01/11/20 0312 01/12/20 0213 01/13/20 0500 01/08/2020 0356 01/10/2020 2023 01/14/2020 2035 01/16/20 0341 01/16/20 0344  PROCALCITON 2.34  --  0.98  --   --   --   --   --   --   --   WBC 7.0   < > 5.9   < > 6.2 4.1  --  5.7 5.4  --   LATICACIDVEN  --   --   --   --   --   --  2.7*  --   --  2.5*   < > = values in this interval not displayed.    Liver Function Tests: Recent Labs  Lab 01/12/20 0213 01/13/20 0500 01/14/2020 2035 01/16/20 0343 01/16/20 0356  AST 90* 71* 56* 54* 54*  ALT 53* 58* 71* 64* 64*  ALKPHOS 94 93 93 81 80  BILITOT 0.5 0.4 0.9 0.4 0.6  PROT 5.1* 5.3* 4.5* 4.5* 4.6*  ALBUMIN 2.7* 2.6* 2.3* 2.6* 2.7*   No results for input(s): LIPASE, AMYLASE in the last 168 hours. No results for input(s): AMMONIA in the last 168 hours.  ABG    Component Value Date/Time   PHART 6.994 (LL) 01/16/2020 0851   PCO2ART >120.00 (HH) 01/16/2020 0851   PO2ART 81.1 (L) 01/16/2020 0851   HCO3 38.3 (H) 01/16/2020 0851   TCO2 44 (H) 01/16/2020 0558   ACIDBASEDEF 2.0 01/11/2020 1203   O2SAT 87.7 01/16/2020 0851     Coagulation Profile: Recent Labs  Lab 01/14/2020 2035 01/16/20 0356  INR 1.5* 1.6*    Cardiac Enzymes: No results for input(s): CKTOTAL, CKMB, CKMBINDEX, TROPONINI in the last 168 hours.  HbA1C: Hgb A1c MFr Bld  Date/Time Value Ref  Range Status   01/16/2020 03:56 AM 10.7 (H) 4.8 - 5.6 % Final    Comment:    (NOTE) Pre diabetes:          5.7%-6.4%  Diabetes:              >6.4%  Glycemic control for   <7.0% adults with diabetes   09/04/2019 03:22 PM 9.9 (H) 4.6 - 6.5 % Final    Comment:    Glycemic Control Guidelines for People with Diabetes:Non Diabetic:  <6%Goal of Therapy: <7%Additional Action Suggested:  >8%     CBG: Recent Labs  Lab 01/16/20 0459 01/16/20 0556 01/16/20 0650 01/16/20 0832 01/16/20 1023  GLUCAP 158* 137* 147* 154* 132*     The patient is critically ill with multiple organ systems failure and requires high complexity decision making for assessment and support, frequent evaluation and titration of therapies, application of advanced monitoring technologies and extensive interpretation of multiple databases.  Critical care time 53 mins. This represents my time independent of the NP's/PA's/med student/residents time taking care of the pt. This is excluding procedures   [facility] 01/16/2020, 12:56 PM

## 2020-01-16 NOTE — Progress Notes (Signed)
[place] for bivalirudin Indication: VV ECMO  Allergies  Allergen Reactions  . Sulfa Antibiotics Rash and Other (See Comments)    Patient Measurements: Height: 5\' 4"  (162.6 cm) Weight: 133.2 kg (293 lb 10.4 oz) (one pillow, one blanket) IBW/kg (Calculated) : 54.7 Heparin Dosing Weight: 87kg  Vital Signs: Temp: 97.9 F (36.6 C) (08/10 1900) Temp Source: Bladder (08/10 1600) BP: 120/51 (08/10 1900) Pulse Rate: 95 (08/10 1900)  Labs: Recent Labs    01/17/2020 0356 01/27/2020 2023 01/22/2020 2035 02/03/2020 2035 01/24/2020 2053 01/16/20 0012 01/16/20 0341 01/16/20 0343 01/16/20 0356 01/16/20 0558 01/16/20 1100 01/16/20 1159 01/16/20 1325 01/16/20 1325 01/16/20 1600 01/16/20 1655 01/16/20 1656  HGB   < >  --  11.2*  --    < >  --  10.4*  --   --    < > 10.7*   < > 10.2*   < > 9.9* 10.1*  --   HCT   < >  --  37.6  --    < >  --  35.5*  --   --    < > 37.0   < > 30.0*  --  29.0* 35.5*  --   PLT   < >  --  99*  --    < >  --  107*  --   --   --  127*  --   --   --   --  99*  --   APTT  --   --  >200*  --    < >   < >  --   --  51*  --  50*  --   --   --   --   --  53*  LABPROT  --   --  17.5*  --   --   --   --   --  18.4*  --   --   --   --   --   --   --   --   INR  --   --  1.5*  --   --   --   --   --  1.6*  --   --   --   --   --   --   --   --   HEPARINUNFRC  --  1.06*  --   --   --   --   --   --   --   --   --   --   --   --   --   --   --   CREATININE   < >  --  0.85   < >  --   --   --  0.74  --   --  1.07*  --   --   --   --  1.34*  --    < > = values in this interval not displayed.    Estimated Creatinine Clearance: 64.5 mL/min (A) (by C-G formula based on SCr of 1.34 mg/dL (H)).   Medical History: Past Medical History:  Diagnosis Date  . Asthma   . Diabetes mellitus without complication ([place])   . Diverticulitis   . Gallstones   . IBS (irritable bowel syndrome)   . NAFLD (nonalcoholic fatty liver disease)    CT scan  2015  . Ovarian cyst     Assessment: 54 year old female admitted with COVID s/p treatment  with antivirals completed 8/6. Now with continued low saturations and decision was made to transfer to [facility] for ECMO cannulation and continue with VV ECMO.   APTT came back within range at 53s, still on lower end of goal, on 0.07 mg/kg/hr. Hgb 10.1, plt 99 (stable). Fibrinogen 216. LDH 543. No s/sx of bleeding or infusion issues.    Goal of Therapy:  aPTT 50-80 seconds Monitor platelets by anticoagulation protocol: Yes   Plan:  Increase bivalirudin to 0.085 mg/kg/hr Aptt in 4 hours Will plan for 0500/1700 aptt checks once dose is stable  Erin Hearing PharmD., BCPS Clinical Pharmacist 01/16/2020 7:54 PM  Please check AMION for all [place] phone numbers After 10:00 PM, call [place] [phone]

## 2020-01-16 NOTE — Progress Notes (Signed)
OT Cancellation Note  Patient Details Name: Alisha Stephenson MRN: [id number] DOB: 05/03/1966   Cancelled Treatment:    Reason Eval/Treat Not Completed: Medical issues which prohibited therapy; pt now on ECMO, currently not medically appropriate to initiate therapies. Please re-consult/update activity orders once pt is appropriate/able to participate.   Lou Cal, OT Acute Rehabilitation Services Pager [phone] Office [phone]   Raymondo Band 01/16/2020, 10:25 AM

## 2020-01-16 NOTE — Progress Notes (Signed)
PT Cancellation Note  Patient Details Name: Alisha Stephenson MRN: [id number] DOB: Feb 13, 1966   Cancelled Treatment:    Reason Eval/Treat Not Completed: Patient not medically ready. Automatic PT order sent with initiation of ECMO. Pt not medically ready at this time. Please reorder when appropriate.    Shary Decamp [facility] 01/16/2020, 10:23 AM [place] Pager [phone] Office [phone]

## 2020-01-16 NOTE — Progress Notes (Addendum)
Advanced Heart Failure Rounding Note   Subjective:    Remains intubated/sedated on vent. On VV ECMO. Bis < 20 but still seems to be triggering breaths on vent.   Improved overnight with sats in low 90s. However this am became more cyanotic and tachycardic.  Patient repositioned and sweep gas adjusted. Now improved. Sats up to 99-100%  Was transiently on NE overnight. Now off. Na 155  Speed 4000RPM Flow 4.5L   7.46/59/60/91%  Vent settings FiO2 100% PEEP 12 Rate 10  TV 150-160  Echo today EF 70-75% RV hyperdynamic    Objective:   Weight Range:  Vital Signs:   Temp:  [96.4 F (35.8 C)-99.5 F (37.5 C)] 99.5 F (37.5 C) (08/10 0900) Pulse Rate:  [0-295] 135 (08/10 0900) Resp:  [0-61] 10 (08/10 0900) BP: (109-181)/(49-114) 117/58 (08/10 0900) SpO2:  [0 %-100 %] 98 % (08/10 0900) Arterial Line BP: (96-197)/(45-78) 100/49 (08/10 0900) FiO2 (%):  [100 %] 100 % (08/10 0837) Weight:  [130.5 kg-133.2 kg] 133.2 kg (08/10 0500) Last BM Date: 01/09/20  Weight change: Filed Weights   01/07/2020 0500 01/14/2020 2135 01/16/20 0500  Weight: 130.5 kg 130.5 kg 133.2 kg    Intake/Output:   Intake/Output Summary (Last 24 hours) at 01/16/2020 0953 Last data filed at 01/16/2020 0900 Gross per 24 hour  Intake 3381.27 ml  Output 2130 ml  Net 1251.27 ml     Physical Exam: General:  Obese woman in bed. Intubated. Cyanotic HEENT: normal + ETT sores on chin from proning Neck: supple. RIJ ECMO cannula. LIJ central line Unable to see JVP . Carotids 2+ bilat; no bruits. No lymphadenopathy or thryomegaly appreciated. Cor: PMI nondisplaced. Regular tachy Lungs: coarse Abdomen: obese soft, nontender, nondistended. No hepatosplenomegaly. No bruits or masses. Good bowel sounds. Extremities: no cyanosis, clubbing, rash, 1+ edema Neuro: intubated/sedated  Telemetry: Sinus tach 110-130 Personally reviewed   Labs: Basic Metabolic Panel: Recent Labs  Lab 01/09/20 1230 01/10/20 0346  01/10/20 0346 01/10/20 1212 01/10/20 1811 01/11/20 0312 01/12/20 0213 01/13/20 0500 01/13/20 0500 01/14/20 0805 01/14/20 0805 01/11/2020 0356 02/01/2020 0356 01/13/2020 2035 01/17/2020 2035 02/05/2020 2053 01/10/2020 2324 01/16/20 0335 01/16/20 0343 01/16/20 0558  NA  --  137   < >   < >  --  137   < > 145   < > 142   < > 146*   < > 147*   < > 150* 151* 152* 155* 155*  K  --  3.7   < >   < >  --  3.5   < > 3.8   < > 3.6   < > 4.8   < > 5.4*   < > 6.2* 5.4* 6.9* 4.3 5.1  CL  --  99   < >  --   --  103   < > 108  --  101  --  106  --  105  --   --   --   --  112*  --   CO2  --  25   < >  --   --  26   < > 28  --  32  --  32  --  31  --   --   --   --  35*  --   GLUCOSE  --  412*   < >  --   --  271*   < > 259*  --  145*  --  321*  --  376*  --   --   --   --  168*  --   BUN  --  43*   < >  --   --  54*   < > 77*  --  69*  --  73*  --  71*  --   --   --   --  72*  --   CREATININE  --  1.61*   < >  --   --  1.38*   < > 0.99  --  0.73  --  0.77  --  0.85  --   --   --   --  0.74  --   CALCIUM  --  7.6*   < >  --   --  6.5*   < > 7.0*   < > 7.5*   < > 7.6*  --  7.7*  --   --   --   --  8.4*  --   MG 2.5* 2.3  --   --  2.1 2.2  --   --   --   --   --   --   --   --   --   --   --   --  3.1*  --   PHOS 2.6 4.0  --   --  5.1* 5.2*  --   --   --   --   --   --   --   --   --   --   --   --  2.3*  --    < > = values in this interval not displayed.    Liver Function Tests: Recent Labs  Lab 01/12/20 0213 01/13/20 0500 02/05/2020 2035 01/16/20 0343 01/16/20 0356  AST 90* 71* 56* 54* 54*  ALT 53* 58* 71* 64* 64*  ALKPHOS 94 93 93 81 80  BILITOT 0.5 0.4 0.9 0.4 0.6  PROT 5.1* 5.3* 4.5* 4.5* 4.6*  ALBUMIN 2.7* 2.6* 2.3* 2.6* 2.7*   No results for input(s): LIPASE, AMYLASE in the last 168 hours. No results for input(s): AMMONIA in the last 168 hours.  CBC: Recent Labs  Lab 01/10/20 0346 01/10/20 1212 01/11/20 0312 01/11/20 0312 01/12/20 0213 01/12/20 0213 01/13/20 0500 01/13/20 0500  01/10/2020 0356 01/29/2020 0356 01/07/2020 2035 01/17/2020 2035 02/01/2020 2053 01/23/2020 2324 01/16/20 0335 01/16/20 0341 01/16/20 0558  WBC 7.0  --  5.9   < > 6.2  --  6.2  --  4.1  --  5.7  --   --   --   --  5.4  --   NEUTROABS 6.0  --  4.9  --  4.9  --  4.9  --   --   --   --   --   --   --   --   --   --   HGB 13.7   < > 12.9   < > 12.6   < > 12.8   < > 13.1   < > 11.2*   < > 10.9* 10.5* 10.9* 10.4* 10.5*  HCT 44.5   < > 40.1   < > 40.0   < > 42.4   < > 44.5   < > 37.6   < > 32.0* 31.0* 32.0* 35.5* 31.0*  MCV 93.3  --  91.6   < > 93.0  --  94.2  --  96.9  --  96.4  --   --   --   --  95.9  --   PLT 168  --  142*   < > 147*  --  132*  --  150  --  99*  --   --   --   --  107*  --    < > = values in this interval not displayed.    Cardiac Enzymes: No results for input(s): CKTOTAL, CKMB, CKMBINDEX, TROPONINI in the last 168 hours.  BNP: BNP (last 3 results) No results for input(s): BNP in the last 8760 hours.  ProBNP (last 3 results) No results for input(s): PROBNP in the last 8760 hours.    Other results:  Imaging: DG Abd 1 View  Result Date: 01/28/2020 CLINICAL DATA:  54 year old female enteric tube placement. EXAM: ABDOMEN - 1 VIEW COMPARISON:  01/13/2020 and earlier. FINDINGS: Portable AP prone view at 0031 hours. Enteric feeding tube tip is at the level of the distal gastric body, projecting in the midline. Paucity of bowel gas. Stable cholecystectomy clips. No acute osseous abnormality identified. IMPRESSION: Enteric feeding tube tip at the level of the distal gastric body. Paucity of bowel gas. Electronically Signed   By: Genevie Ann M.D.   On: 02/04/2020 02:31   CARDIAC CATHETERIZATION  Result Date: 01/16/2020 See surgical note for result.  DG Chest Port 1 View in am  Result Date: 01/16/2020 CLINICAL DATA:  Hypoxia EXAM: PORTABLE CHEST 1 VIEW COMPARISON:  January 15, 2020. FINDINGS: Endotracheal tube tip is 3.6 cm above the carina. ECMO catheter tip is below the diaphragm.  Feeding tube tip is below the diaphragm. Central catheter tip is in the superior vena cava. No pneumothorax. There are pleural effusions bilaterally with multifocal airspace opacity, stable. Heart is enlarged, stable. No adenopathy appreciable. Postoperative change noted in the lower cervical region. IMPRESSION: Tube and catheter positions as described without pneumothorax. Pleural effusions bilaterally with widespread airspace consolidation persists. Stable cardiomegaly. Electronically Signed   By: Lowella Grip III M.D.   On: 01/16/2020 08:19   DG Chest Port 1 View  Result Date: 01/09/2020 CLINICAL DATA:  ECMO. EXAM: PORTABLE CHEST 1 VIEW COMPARISON:  Earlier today. FINDINGS: Interval right jugular ECMO catheter extending into the mid to lower abdomen, with its tip not included. Feeding tube tip in the mid stomach. Endotracheal tube tip 1.2 cm above the carina. Left jugular catheter tip in the superior vena cava. Right PICC tip in the region of the superior cavoatrial junction. Extensive dense bilateral airspace opacity with mild improvement. Interval visualization of the right heart border without visualization of the left heart borders. Mild thoracic spine degenerative changes. IMPRESSION: 1. Interval right jugular ECMO catheter extending into the mid to lower abdomen, with its tip not included. 2. Extensive dense bilateral pneumonia for alveolar edema with mild improvement. 3. Endotracheal tube tip 1.2 cm above the carina. This could be retracted 3 cm for better positioning. These results will be called to the ordering clinician or representative by the Radiologist Assistant, and communication documented in the PACS or [employer]. Electronically Signed   By: Claudie Revering M.D.   On: 01/11/2020 21:21   DG Chest Port 1 View  Result Date: 01/21/2020 CLINICAL DATA:  54 year old female with respiratory failure due to COVID 19. EXAM: PORTABLE CHEST 1 VIEW COMPARISON:  01/13/2020 FINDINGS: The ETT tip  is stable above the carina. There is a feeding tube with tip noted just below the level of the GE junction. The right arm PICC line tip is in the distal SVC. Significant interval progression  of bilateral pulmonary opacities. IMPRESSION: 1. Significant interval progression of bilateral pulmonary opacities compatible with progressive multifocal pneumonia. 2. Stable support apparatus. 3. Tip of the feeding tube is just below the level of the GE junction. Electronically Signed   By: Kerby Moors M.D.   On: 01/26/2020 10:27      Medications:     Scheduled Medications: . acetaZOLAMIDE  250 mg Per Tube BID  . artificial tears  1 application Both Eyes S3M  . vitamin C  500 mg Per Tube Daily  . chlorhexidine gluconate (MEDLINE KIT)  15 mL Mouth Rinse BID  . Chlorhexidine Gluconate Cloth  6 each Topical Daily  . cisatracurium  10 mg Intravenous Once  . clonazePAM  1 mg Per Tube BID  . feeding supplement (PROSource TF)  90 mL Per Tube BID  . fentaNYL (SUBLIMAZE) injection  50 mcg Intravenous Once  . free water  200 mL Per Tube Q2H  . insulin aspart  0-20 Units Subcutaneous Q4H  . insulin detemir  30 Units Subcutaneous BID  . mouth rinse  15 mL Mouth Rinse 10 times per day  . methylPREDNISolone (SOLU-MEDROL) injection  40 mg Intravenous Daily  . oxyCODONE  5 mg Per Tube Q6H  . pantoprazole sodium  40 mg Per Tube Daily  . polyethylene glycol  17 g Per Tube Daily  . sennosides  5 mL Per Tube Daily  . sodium chloride flush  10-40 mL Intracatheter Q12H  . zinc sulfate  220 mg Per Tube Daily     Infusions: . sodium chloride Stopped (01/26/2020 2352)  . sodium chloride Stopped (02/03/2020 1009)  . bivalirudin (ANGIOMAX) infusion 0.5 mg/mL (Non-ACS indications) 0.06 mg/kg/hr (01/16/20 0900)  . cisatracurium (NIMBEX) infusion 6 mcg/kg/min (01/16/20 0900)  . dextrose 5 % and 0.45% NaCl Stopped (01/27/2020 0827)  . feeding supplement (VITAL 1.5 CAL) Stopped (02/04/2020 1700)  . fentaNYL infusion  INTRAVENOUS 300 mcg/hr (01/16/20 0924)  . midazolam 10 mg/hr (01/16/20 0900)  . norepinephrine (LEVOPHED) Adult infusion Stopped (01/16/20 0625)     PRN Medications:  sodium chloride, acetaminophen (TYLENOL) oral liquid 160 mg/5 mL, albuterol, fentaNYL, fentaNYL, guaiFENesin-dextromethorphan, hydrALAZINE, hydrOXYzine, midazolam, ondansetron **OR** ondansetron (ZOFRAN) IV, sodium chloride flush   Assessment/Plan:   1. Acute hypoxic/hypercapneic respiratory failure in setting of severe COVID PNA/ARDS -> VV ECMO - admit 8/2 - intubation 8/3 (today = day 7) - has received actmera (compelted 8/2), remdesivir (completed 8/6) and steroids - failed full vent support with proning/paralytic - Cannulated for VV ECMO on 8/9 - Improved on ECMO this am. Cannula may be a bit low on CXR but flows and sats are good so will leave at current position.  - Continue lung rest strategy - Will likely need trach soon  - Start diamox for metabolic alkalosis - will begin to wean parlalytic - Continue bival Discussed dosing with PharmD personally. Follow CBC  2. Morbid obesity - Body mass index is 49.38 kg/m.  3. Poorly controlled DM2 - HgBA1c 10.7 - Adjust insulin as needed  4. Hypernatremia - increase free water. Follow closely. Can give IVF as needed.   Family updated by telephone.   CRITICAL CARE Performed by: Glori Bickers  Total critical care time: 120 minutes  Critical care time was exclusive of separately billable procedures and treating other patients.  Critical care was necessary to treat or prevent imminent or life-threatening deterioration.  Critical care was time spent personally by me (independent of midlevel providers or residents) on the following activities: development  of treatment plan with patient and/or surrogate as well as nursing, discussions with consultants, evaluation of patient's response to treatment, examination of patient, obtaining history from patient or  surrogate, ordering and performing treatments and interventions, ordering and review of laboratory studies, ordering and review of radiographic studies, pulse oximetry and re-evaluation of patient's condition.   Length of Stay: 8   Glori Bickers  MD 01/16/2020, 9:53 AM  Advanced Heart Failure Team Pager [phone] (M-F; 7a - 4p)  Please contact [facility] for night-coverage after hours (4p -7a ) and weekends on [email or web address]

## 2020-01-16 NOTE — Progress Notes (Addendum)
Initial Nutrition Assessment  DOCUMENTATION CODES:   Morbid obesity  INTERVENTION:   No BM x 7 days- scheduled regimen in place, discuss possible suppository   Tube feeding:  -Pivot 1.5 @ 20 ml/hr via NG -Increase by 10 ml Q8 hours to goal rate of 70 ml/hr (1680 ml) -Juven BID -45 ml Prosource TF daily  -Free water flushes 200 ml Q2 hours- per HF  Provides: 2750 kcals, 174 grams protein, 1260 ml free water (3660 ml with flushes).   NUTRITION DIAGNOSIS:   Increased nutrient needs related to acute illness (COVID-19 infection) as evidenced by estimated needs.  Ongoing  GOAL:   Patient will meet greater than or equal to 90% of their needs  Addressed via TF  MONITOR:   Vent status, TF tolerance, Labs, Weight trends  REASON FOR ASSESSMENT:   Ventilator, Consult Enteral/tube feeding initiation and management  ASSESSMENT:   54 y.o. female with medical history of type 2 DM. She presented to the ED at [facility] with SOB diarrhea x1 week, and cough. She was dx with COVID-19 one week prior to presentation to the ED.   8/10- tx from [facility], s/p VV ECMO cannulation  Pt discussed during ICU rounds and with RN.   Off nimbex. Off NE. Hypernatremia continues- free water increased per HF. Will likely need trach soon. Awaiting BM despite schedule regimen. Will see about possible suppository. Titrate tube feeding to goal.   Monitor CBGs. Insulin likely will need adjustment with advancement of tube feeding.   Per RN, skin tears not improved. Shows to have more blisters concerning for further breakdown.   Admission weight: 133.6 kg Current weight: 133.2 kg   Patient remains intubated on ventilator support MV: 1.5 L/min Temp (24hrs), Avg:97.5 F (36.4 C), Min:96.4 F (35.8 C), Max:99.5 F (37.5 C)  I/O: +2,396 ml since admit  UOP: 2,535 ml x 24 hrs   Drips: levophed Medications: 500 mg Vit C daily, SS novolog, levemir, solumedrol, miralax, senokot, 200 mg zinc  sulfate daily Labs: Na 155 (H)-up from yesterday Phosphorus 2.3 (L) Mg 3.1 (H) CBG 132-376  Diet Order:   Diet Order            Diet NPO time specified  Diet effective now                 EDUCATION NEEDS:   No education needs have been identified at this time  Skin:  Skin Assessment: Skin Integrity Issues: Skin Integrity Issues:: Other (Comment) Other: MASD-breast skin tears- face/chest  Last BM:  8/3  Height:   Ht Readings from Last 1 Encounters:  01/21/2020 5\' 4"  (1.626 m)    Weight:   Wt Readings from Last 1 Encounters:  01/16/20 133.2 kg    Ideal Body Weight:  54.5 kg   BMI:  Body mass index is 50.41 kg/m.  Estimated Nutritional Needs:   Kcal:  2753 kcal  Protein:  135-180 grams  Fluid:  >/= 2.7 L/day   Mariana Single RD, LDN Clinical Nutrition Pager listed in [place]

## 2020-01-16 NOTE — Care Plan (Signed)
Hypoxia worsening throughout the day since 8AM ABG. Chugging responsive to albumin and paralytics earlier, but saturations slowly dropping since despite being back at flows ~5L per minute. Now Vt lower post-NMB. Flashing with flows 5.5L, RPMs subsequently decreased again.   CXR reviewed- worse aeration compared to 8/10 morning CXR and post-cannulation CXRs last night. Cannula position within 1 cm of previous positioning, likely attributable to different positioning.  Repeat H/H 9.3/ 31.4  Plan: Checking lactic acid--> if increased will transfuse 1 unit pRBCs PEEP increased to 14, PS decreased to 10 to facilitate acceptable pressures. D/w bedside team.  Julian Hy, DO 01/16/20 11:37 PM [facility]

## 2020-01-17 ENCOUNTER — Inpatient Hospital Stay (HOSPITAL_COMMUNITY): Payer: PRIVATE HEALTH INSURANCE

## 2020-01-17 ENCOUNTER — Encounter (HOSPITAL_COMMUNITY): Payer: Self-pay | Admitting: Internal Medicine

## 2020-01-17 DIAGNOSIS — J80 Acute respiratory distress syndrome: Secondary | ICD-10-CM

## 2020-01-17 DIAGNOSIS — J9600 Acute respiratory failure, unspecified whether with hypoxia or hypercapnia: Secondary | ICD-10-CM | POA: Diagnosis not present

## 2020-01-17 DIAGNOSIS — E870 Hyperosmolality and hypernatremia: Secondary | ICD-10-CM | POA: Diagnosis not present

## 2020-01-17 DIAGNOSIS — U071 COVID-19: Secondary | ICD-10-CM | POA: Diagnosis not present

## 2020-01-17 DIAGNOSIS — Z9911 Dependence on respirator [ventilator] status: Secondary | ICD-10-CM | POA: Diagnosis not present

## 2020-01-17 LAB — POCT I-STAT 7, (LYTES, BLD GAS, ICA,H+H)
Acid-Base Excess: 6 mmol/L — ABNORMAL HIGH (ref 0.0–2.0)
Acid-Base Excess: 6 mmol/L — ABNORMAL HIGH (ref 0.0–2.0)
Acid-Base Excess: 4 mmol/L — ABNORMAL HIGH (ref 0.0–2.0)
Acid-Base Excess: 6 mmol/L — ABNORMAL HIGH (ref 0.0–2.0)
Acid-Base Excess: 9 mmol/L — ABNORMAL HIGH (ref 0.0–2.0)
Acid-Base Excess: 7 mmol/L — ABNORMAL HIGH (ref 0.0–2.0)
Acid-Base Excess: 6 mmol/L — ABNORMAL HIGH (ref 0.0–2.0)
Acid-Base Excess: 14 mmol/L — ABNORMAL HIGH (ref 0.0–2.0)
Acid-Base Excess: 15 mmol/L — ABNORMAL HIGH (ref 0.0–2.0)
Acid-Base Excess: 8 mmol/L — ABNORMAL HIGH (ref 0.0–2.0)
Acid-Base Excess: 4 mmol/L — ABNORMAL HIGH (ref 0.0–2.0)
Acid-Base Excess: 7 mmol/L — ABNORMAL HIGH (ref 0.0–2.0)
Bicarbonate: 30.4 mmol/L — ABNORMAL HIGH (ref 20.0–28.0)
Bicarbonate: 31.1 mmol/L — ABNORMAL HIGH (ref 20.0–28.0)
Bicarbonate: 29.2 mmol/L — ABNORMAL HIGH (ref 20.0–28.0)
Bicarbonate: 30.3 mmol/L — ABNORMAL HIGH (ref 20.0–28.0)
Bicarbonate: 33.5 mmol/L — ABNORMAL HIGH (ref 20.0–28.0)
Bicarbonate: 31.3 mmol/L — ABNORMAL HIGH (ref 20.0–28.0)
Bicarbonate: 30.3 mmol/L — ABNORMAL HIGH (ref 20.0–28.0)
Bicarbonate: 32.7 mmol/L — ABNORMAL HIGH (ref 20.0–28.0)
Bicarbonate: 41.1 mmol/L — ABNORMAL HIGH (ref 20.0–28.0)
Bicarbonate: 41.4 mmol/L — ABNORMAL HIGH (ref 20.0–28.0)
Bicarbonate: 29.1 mmol/L — ABNORMAL HIGH (ref 20.0–28.0)
Bicarbonate: 31 mmol/L — ABNORMAL HIGH (ref 20.0–28.0)
Calcium, Ion: 1.16 mmol/L (ref 1.15–1.40)
Calcium, Ion: 1.15 mmol/L (ref 1.15–1.40)
Calcium, Ion: 1.08 mmol/L — ABNORMAL LOW (ref 1.15–1.40)
Calcium, Ion: 1.13 mmol/L — ABNORMAL LOW (ref 1.15–1.40)
Calcium, Ion: 1.15 mmol/L (ref 1.15–1.40)
Calcium, Ion: 1.16 mmol/L (ref 1.15–1.40)
Calcium, Ion: 1.18 mmol/L (ref 1.15–1.40)
Calcium, Ion: 1.11 mmol/L — ABNORMAL LOW (ref 1.15–1.40)
Calcium, Ion: 1.12 mmol/L — ABNORMAL LOW (ref 1.15–1.40)
Calcium, Ion: 1.16 mmol/L (ref 1.15–1.40)
Calcium, Ion: 1.2 mmol/L (ref 1.15–1.40)
Calcium, Ion: 1.22 mmol/L (ref 1.15–1.40)
HCT: 26 % — ABNORMAL LOW (ref 36.0–46.0)
HCT: 29 % — ABNORMAL LOW (ref 36.0–46.0)
HCT: 28 % — ABNORMAL LOW (ref 36.0–46.0)
HCT: 29 % — ABNORMAL LOW (ref 36.0–46.0)
HCT: 29 % — ABNORMAL LOW (ref 36.0–46.0)
HCT: 29 % — ABNORMAL LOW (ref 36.0–46.0)
HCT: 29 % — ABNORMAL LOW (ref 36.0–46.0)
HCT: 31 % — ABNORMAL LOW (ref 36.0–46.0)
HCT: 32 % — ABNORMAL LOW (ref 36.0–46.0)
HCT: 37 % (ref 36.0–46.0)
HCT: 29 % — ABNORMAL LOW (ref 36.0–46.0)
HCT: 31 % — ABNORMAL LOW (ref 36.0–46.0)
Hemoglobin: 8.8 g/dL — ABNORMAL LOW (ref 12.0–15.0)
Hemoglobin: 9.9 g/dL — ABNORMAL LOW (ref 12.0–15.0)
Hemoglobin: 9.5 g/dL — ABNORMAL LOW (ref 12.0–15.0)
Hemoglobin: 9.9 g/dL — ABNORMAL LOW (ref 12.0–15.0)
Hemoglobin: 9.9 g/dL — ABNORMAL LOW (ref 12.0–15.0)
Hemoglobin: 9.9 g/dL — ABNORMAL LOW (ref 12.0–15.0)
Hemoglobin: 9.9 g/dL — ABNORMAL LOW (ref 12.0–15.0)
Hemoglobin: 10.5 g/dL — ABNORMAL LOW (ref 12.0–15.0)
Hemoglobin: 10.9 g/dL — ABNORMAL LOW (ref 12.0–15.0)
Hemoglobin: 12.6 g/dL (ref 12.0–15.0)
Hemoglobin: 10.5 g/dL — ABNORMAL LOW (ref 12.0–15.0)
Hemoglobin: 9.9 g/dL — ABNORMAL LOW (ref 12.0–15.0)
O2 Saturation: 77 %
O2 Saturation: 80 %
O2 Saturation: 100 %
O2 Saturation: 76 %
O2 Saturation: 89 %
O2 Saturation: 96 %
O2 Saturation: 96 %
O2 Saturation: 93 %
O2 Saturation: 94 %
O2 Saturation: 98 %
O2 Saturation: 92 %
O2 Saturation: 97 %
Patient temperature: 36
Patient temperature: 35.7
Patient temperature: 36.7
Patient temperature: 36.7
Patient temperature: 36.7
Patient temperature: 35.9
Patient temperature: 35.9
Patient temperature: 36
Patient temperature: 36.8
Patient temperature: 97
Patient temperature: 36.7
Patient temperature: 36.7
Potassium: 4 mmol/L (ref 3.5–5.1)
Potassium: 4.2 mmol/L (ref 3.5–5.1)
Potassium: 3.7 mmol/L (ref 3.5–5.1)
Potassium: 3.8 mmol/L (ref 3.5–5.1)
Potassium: 3.8 mmol/L (ref 3.5–5.1)
Potassium: 3.7 mmol/L (ref 3.5–5.1)
Potassium: 3.9 mmol/L (ref 3.5–5.1)
Potassium: 5.6 mmol/L — ABNORMAL HIGH (ref 3.5–5.1)
Potassium: 6.9 mmol/L (ref 3.5–5.1)
Potassium: 7 mmol/L (ref 3.5–5.1)
Potassium: 4.5 mmol/L (ref 3.5–5.1)
Potassium: 4.7 mmol/L (ref 3.5–5.1)
Sodium: 151 mmol/L — ABNORMAL HIGH (ref 135–145)
Sodium: 151 mmol/L — ABNORMAL HIGH (ref 135–145)
Sodium: 152 mmol/L — ABNORMAL HIGH (ref 135–145)
Sodium: 152 mmol/L — ABNORMAL HIGH (ref 135–145)
Sodium: 153 mmol/L — ABNORMAL HIGH (ref 135–145)
Sodium: 153 mmol/L — ABNORMAL HIGH (ref 135–145)
Sodium: 152 mmol/L — ABNORMAL HIGH (ref 135–145)
Sodium: 150 mmol/L — ABNORMAL HIGH (ref 135–145)
Sodium: 151 mmol/L — ABNORMAL HIGH (ref 135–145)
Sodium: 152 mmol/L — ABNORMAL HIGH (ref 135–145)
Sodium: 151 mmol/L — ABNORMAL HIGH (ref 135–145)
Sodium: 152 mmol/L — ABNORMAL HIGH (ref 135–145)
TCO2: 32 mmol/L (ref 22–32)
TCO2: 32 mmol/L (ref 22–32)
TCO2: 30 mmol/L (ref 22–32)
TCO2: 32 mmol/L (ref 22–32)
TCO2: 35 mmol/L — ABNORMAL HIGH (ref 22–32)
TCO2: 33 mmol/L — ABNORMAL HIGH (ref 22–32)
TCO2: 32 mmol/L (ref 22–32)
TCO2: 34 mmol/L — ABNORMAL HIGH (ref 22–32)
TCO2: 43 mmol/L — ABNORMAL HIGH (ref 22–32)
TCO2: 43 mmol/L — ABNORMAL HIGH (ref 22–32)
TCO2: 30 mmol/L (ref 22–32)
TCO2: 32 mmol/L (ref 22–32)
pCO2 arterial: 40.2 mmHg (ref 32.0–48.0)
pCO2 arterial: 42.4 mmHg (ref 32.0–48.0)
pCO2 arterial: 43.1 mmHg (ref 32.0–48.0)
pCO2 arterial: 43.1 mmHg (ref 32.0–48.0)
pCO2 arterial: 43.2 mmHg (ref 32.0–48.0)
pCO2 arterial: 41.5 mmHg (ref 32.0–48.0)
pCO2 arterial: 41 mmHg (ref 32.0–48.0)
pCO2 arterial: 44.7 mmHg (ref 32.0–48.0)
pCO2 arterial: 54.8 mmHg — ABNORMAL HIGH (ref 32.0–48.0)
pCO2 arterial: 61.9 mmHg — ABNORMAL HIGH (ref 32.0–48.0)
pCO2 arterial: 41.5 mmHg (ref 32.0–48.0)
pCO2 arterial: 42.3 mmHg (ref 32.0–48.0)
pH, Arterial: 7.482 — ABNORMAL HIGH (ref 7.350–7.450)
pH, Arterial: 7.467 — ABNORMAL HIGH (ref 7.350–7.450)
pH, Arterial: 7.437 (ref 7.350–7.450)
pH, Arterial: 7.453 — ABNORMAL HIGH (ref 7.350–7.450)
pH, Arterial: 7.497 — ABNORMAL HIGH (ref 7.350–7.450)
pH, Arterial: 7.481 — ABNORMAL HIGH (ref 7.350–7.450)
pH, Arterial: 7.473 — ABNORMAL HIGH (ref 7.350–7.450)
pH, Arterial: 7.43 (ref 7.350–7.450)
pH, Arterial: 7.471 — ABNORMAL HIGH (ref 7.350–7.450)
pH, Arterial: 7.48 — ABNORMAL HIGH (ref 7.350–7.450)
pH, Arterial: 7.445 (ref 7.350–7.450)
pH, Arterial: 7.481 — ABNORMAL HIGH (ref 7.350–7.450)
pO2, Arterial: 37 mmHg — CL (ref 83.0–108.0)
pO2, Arterial: 39 mmHg — CL (ref 83.0–108.0)
pO2, Arterial: 39 mmHg — CL (ref 83.0–108.0)
pO2, Arterial: 521 mmHg — ABNORMAL HIGH (ref 83.0–108.0)
pO2, Arterial: 53 mmHg — ABNORMAL LOW (ref 83.0–108.0)
pO2, Arterial: 76 mmHg — ABNORMAL LOW (ref 83.0–108.0)
pO2, Arterial: 70 mmHg — ABNORMAL LOW (ref 83.0–108.0)
pO2, Arterial: 103 mmHg (ref 83.0–108.0)
pO2, Arterial: 61 mmHg — ABNORMAL LOW (ref 83.0–108.0)
pO2, Arterial: 66 mmHg — ABNORMAL LOW (ref 83.0–108.0)
pO2, Arterial: 61 mmHg — ABNORMAL LOW (ref 83.0–108.0)
pO2, Arterial: 86 mmHg (ref 83.0–108.0)

## 2020-01-17 LAB — BASIC METABOLIC PANEL
Anion gap: 9 (ref 5–15)
Anion gap: 8 (ref 5–15)
BUN: 99 mg/dL — ABNORMAL HIGH (ref 6–20)
BUN: 97 mg/dL — ABNORMAL HIGH (ref 6–20)
CO2: 29 mmol/L (ref 22–32)
CO2: 30 mmol/L (ref 22–32)
Calcium: 8.1 mg/dL — ABNORMAL LOW (ref 8.9–10.3)
Calcium: 8.4 mg/dL — ABNORMAL LOW (ref 8.9–10.3)
Chloride: 111 mmol/L (ref 98–111)
Chloride: 112 mmol/L — ABNORMAL HIGH (ref 98–111)
Creatinine, Ser: 1.17 mg/dL — ABNORMAL HIGH (ref 0.44–1.00)
Creatinine, Ser: 1.03 mg/dL — ABNORMAL HIGH (ref 0.44–1.00)
GFR calc Af Amer: >60 mL/min (ref >60)
GFR calc Af Amer: >60 mL/min (ref >60)
GFR calc non Af Amer: 52 mL/min — ABNORMAL LOW (ref >60)
GFR calc non Af Amer: >60 mL/min (ref >60)
Glucose, Bld: 332 mg/dL — ABNORMAL HIGH (ref 70–99)
Glucose, Bld: 337 mg/dL — ABNORMAL HIGH (ref 70–99)
Potassium: 4 mmol/L (ref 3.5–5.1)
Potassium: 5 mmol/L (ref 3.5–5.1)
Sodium: 149 mmol/L — ABNORMAL HIGH (ref 135–145)
Sodium: 150 mmol/L — ABNORMAL HIGH (ref 135–145)

## 2020-01-17 LAB — GLUCOSE, CAPILLARY
Glucose-Capillary: 296 mg/dL — ABNORMAL HIGH (ref 70–99)
Glucose-Capillary: 253 mg/dL — ABNORMAL HIGH (ref 70–99)
Glucose-Capillary: 291 mg/dL — ABNORMAL HIGH (ref 70–99)
Glucose-Capillary: 317 mg/dL — ABNORMAL HIGH (ref 70–99)
Glucose-Capillary: 340 mg/dL — ABNORMAL HIGH (ref 70–99)
Glucose-Capillary: 313 mg/dL — ABNORMAL HIGH (ref 70–99)

## 2020-01-17 LAB — CBC
HCT: 33.3 % — ABNORMAL LOW (ref 36.0–46.0)
HCT: 35.2 % — ABNORMAL LOW (ref 36.0–46.0)
Hemoglobin: 10.2 g/dL — ABNORMAL LOW (ref 12.0–15.0)
Hemoglobin: 10.8 g/dL — ABNORMAL LOW (ref 12.0–15.0)
MCH: 29 pg (ref 26.0–34.0)
MCH: 28.9 pg (ref 26.0–34.0)
MCHC: 30.6 g/dL (ref 30.0–36.0)
MCHC: 30.7 g/dL (ref 30.0–36.0)
MCV: 94.6 fL (ref 80.0–100.0)
MCV: 94.1 fL (ref 80.0–100.0)
Platelets: 91 K/uL — ABNORMAL LOW (ref 150–400)
Platelets: 92 10*3/uL — ABNORMAL LOW (ref 150–400)
RBC: 3.52 MIL/uL — ABNORMAL LOW (ref 3.87–5.11)
RBC: 3.74 MIL/uL — ABNORMAL LOW (ref 3.87–5.11)
RDW: 15.5 % (ref 11.5–15.5)
RDW: 15.7 % — ABNORMAL HIGH (ref 11.5–15.5)
WBC: 7.3 K/uL (ref 4.0–10.5)
WBC: 7.7 10*3/uL (ref 4.0–10.5)
nRBC: 0.7 % — ABNORMAL HIGH (ref 0.0–0.2)
nRBC: 0.8 % — ABNORMAL HIGH (ref 0.0–0.2)

## 2020-01-17 LAB — HEPATIC FUNCTION PANEL
ALT: 59 U/L — ABNORMAL HIGH (ref 0–44)
AST: 58 U/L — ABNORMAL HIGH (ref 15–41)
Albumin: 3 g/dL — ABNORMAL LOW (ref 3.5–5.0)
Alkaline Phosphatase: 92 U/L (ref 38–126)
Bilirubin, Direct: 0.4 mg/dL — ABNORMAL HIGH (ref 0.0–0.2)
Indirect Bilirubin: 0.8 mg/dL (ref 0.3–0.9)
Total Bilirubin: 1.2 mg/dL (ref 0.3–1.2)
Total Protein: 4.5 g/dL — ABNORMAL LOW (ref 6.5–8.1)

## 2020-01-17 LAB — PREPARE RBC (CROSSMATCH)

## 2020-01-17 LAB — PROTIME-INR
INR: 2.1 — ABNORMAL HIGH (ref 0.8–1.2)
Prothrombin Time: 23.2 s — ABNORMAL HIGH (ref 11.4–15.2)

## 2020-01-17 LAB — LACTIC ACID, PLASMA
Lactic Acid, Venous: 2.4 mmol/L (ref 0.5–1.9)
Lactic Acid, Venous: 2.8 mmol/L (ref 0.5–1.9)
Lactic Acid, Venous: 2.1 mmol/L (ref 0.5–1.9)

## 2020-01-17 LAB — COOXEMETRY PANEL
Carboxyhemoglobin: 2.3 % — ABNORMAL HIGH (ref 0.5–1.5)
Methemoglobin: 1.2 % (ref 0.0–1.5)
O2 Saturation: 78.8 %
Total hemoglobin: 10.6 g/dL — ABNORMAL LOW (ref 12.0–16.0)

## 2020-01-17 LAB — APTT
aPTT: 96 s — ABNORMAL HIGH (ref 24–36)
aPTT: 62 seconds — ABNORMAL HIGH (ref 24–36)
aPTT: 59 seconds — ABNORMAL HIGH (ref 24–36)
aPTT: 52 seconds — ABNORMAL HIGH (ref 24–36)

## 2020-01-17 LAB — LACTATE DEHYDROGENASE: LDH: 501 U/L — ABNORMAL HIGH (ref 98–192)

## 2020-01-17 LAB — FIBRINOGEN: Fibrinogen: 213 mg/dL (ref 210–475)

## 2020-01-17 MED ORDER — DOCUSATE SODIUM 50 MG/5ML PO LIQD
100.0000 mg | Freq: Two times a day (BID) | ORAL | Status: DC
  Administered 2020-01-17 – 2020-03-04 (×68): 100 mg
  Filled 2020-01-17 (×71): qty 10

## 2020-01-17 MED ORDER — DEXMEDETOMIDINE HCL IN NACL 400 MCG/100ML IV SOLN
0.4000 ug/kg/h | INTRAVENOUS | Status: DC
  Administered 2020-01-17: 0.7 ug/kg/h via INTRAVENOUS
  Administered 2020-01-17: 0.6 ug/kg/h via INTRAVENOUS
  Administered 2020-01-18: 0.8 ug/kg/h via INTRAVENOUS
  Administered 2020-01-18: 0.5 ug/kg/h via INTRAVENOUS
  Administered 2020-01-18: 1 ug/kg/h via INTRAVENOUS
  Administered 2020-01-18: 0.7 ug/kg/h via INTRAVENOUS
  Administered 2020-01-18: 1 ug/kg/h via INTRAVENOUS
  Administered 2020-01-19 (×2): 1.2 ug/kg/h via INTRAVENOUS
  Administered 2020-01-19: 0.8 ug/kg/h via INTRAVENOUS
  Administered 2020-01-19 (×2): 1.2 ug/kg/h via INTRAVENOUS
  Administered 2020-01-19: 1 ug/kg/h via INTRAVENOUS
  Administered 2020-01-19 – 2020-01-21 (×13): 1.2 ug/kg/h via INTRAVENOUS
  Administered 2020-01-21: 1 ug/kg/h via INTRAVENOUS
  Administered 2020-01-21: 1.2 ug/kg/h via INTRAVENOUS
  Administered 2020-01-22: 1 ug/kg/h via INTRAVENOUS
  Administered 2020-01-22: 1.2 ug/kg/h via INTRAVENOUS
  Administered 2020-01-22: 0.9 ug/kg/h via INTRAVENOUS
  Administered 2020-01-22 – 2020-01-24 (×11): 1.2 ug/kg/h via INTRAVENOUS
  Administered 2020-01-24: 1 ug/kg/h via INTRAVENOUS
  Administered 2020-01-24 – 2020-01-25 (×7): 1.2 ug/kg/h via INTRAVENOUS
  Administered 2020-01-26: 0.6 ug/kg/h via INTRAVENOUS
  Administered 2020-01-27: 0.4 ug/kg/h via INTRAVENOUS
  Administered 2020-01-28: 0.6 ug/kg/h via INTRAVENOUS
  Administered 2020-01-28: 0.4 ug/kg/h via INTRAVENOUS
  Administered 2020-01-28: 0.6 ug/kg/h via INTRAVENOUS
  Administered 2020-01-29: 0.8 ug/kg/h via INTRAVENOUS
  Administered 2020-01-29: 0.7 ug/kg/h via INTRAVENOUS
  Administered 2020-01-29: 0.8 ug/kg/h via INTRAVENOUS
  Administered 2020-01-30: 1 ug/kg/h via INTRAVENOUS
  Administered 2020-01-30 (×6): 1.2 ug/kg/h via INTRAVENOUS
  Administered 2020-01-30: 0.591 ug/kg/h via INTRAVENOUS
  Administered 2020-01-31: 0.8 ug/kg/h via INTRAVENOUS
  Administered 2020-01-31 – 2020-02-01 (×9): 1.2 ug/kg/h via INTRAVENOUS
  Administered 2020-02-01: 1 ug/kg/h via INTRAVENOUS
  Administered 2020-02-01: 1.2 ug/kg/h via INTRAVENOUS
  Administered 2020-02-01: 1 ug/kg/h via INTRAVENOUS
  Administered 2020-02-01: 1.2 ug/kg/h via INTRAVENOUS
  Administered 2020-02-01 (×2): 1 ug/kg/h via INTRAVENOUS
  Administered 2020-02-01 (×2): 1.2 ug/kg/h via INTRAVENOUS
  Administered 2020-02-02 – 2020-02-03 (×8): 1 ug/kg/h via INTRAVENOUS
  Administered 2020-02-03: 0.9 ug/kg/h via INTRAVENOUS
  Administered 2020-02-03: 1 ug/kg/h via INTRAVENOUS
  Administered 2020-02-03: 0.9 ug/kg/h via INTRAVENOUS
  Administered 2020-02-03: 1 ug/kg/h via INTRAVENOUS
  Administered 2020-02-03: 0.9 ug/kg/h via INTRAVENOUS
  Administered 2020-02-03 (×2): 1 ug/kg/h via INTRAVENOUS
  Administered 2020-02-04: 0.8 ug/kg/h via INTRAVENOUS
  Administered 2020-02-04 – 2020-02-05 (×6): 0.9 ug/kg/h via INTRAVENOUS
  Administered 2020-02-05: 1 ug/kg/h via INTRAVENOUS
  Administered 2020-02-05: 0.9 ug/kg/h via INTRAVENOUS
  Administered 2020-02-05: 1.2 ug/kg/h via INTRAVENOUS
  Administered 2020-02-05: 0.9 ug/kg/h via INTRAVENOUS
  Administered 2020-02-05: 1 ug/kg/h via INTRAVENOUS
  Administered 2020-02-06: 0.5 ug/kg/h via INTRAVENOUS
  Administered 2020-02-06: 1.2 ug/kg/h via INTRAVENOUS
  Administered 2020-02-06: 0.9 ug/kg/h via INTRAVENOUS
  Administered 2020-02-06: 0.8 ug/kg/h via INTRAVENOUS
  Administered 2020-02-06 – 2020-02-07 (×2): 0.9 ug/kg/h via INTRAVENOUS
  Administered 2020-02-07: 0.8 ug/kg/h via INTRAVENOUS
  Administered 2020-02-07: 0.9 ug/kg/h via INTRAVENOUS
  Administered 2020-02-07 – 2020-02-08 (×2): 0.4 ug/kg/h via INTRAVENOUS
  Administered 2020-02-08: 1 ug/kg/h via INTRAVENOUS
  Administered 2020-02-09: 0.4 ug/kg/h via INTRAVENOUS
  Filled 2020-01-17: qty 100
  Filled 2020-01-17 (×2): qty 300
  Filled 2020-01-17 (×12): qty 100
  Filled 2020-01-17: qty 200
  Filled 2020-01-17 (×2): qty 100
  Filled 2020-01-17: qty 200
  Filled 2020-01-17 (×2): qty 100
  Filled 2020-01-17 (×2): qty 200
  Filled 2020-01-17: qty 100
  Filled 2020-01-17: qty 200
  Filled 2020-01-17: qty 300
  Filled 2020-01-17: qty 100
  Filled 2020-01-17: qty 200
  Filled 2020-01-17: qty 100
  Filled 2020-01-17: qty 200
  Filled 2020-01-17 (×2): qty 100
  Filled 2020-01-17: qty 200
  Filled 2020-01-17 (×3): qty 100
  Filled 2020-01-17: qty 200
  Filled 2020-01-17: qty 100
  Filled 2020-01-17: qty 300
  Filled 2020-01-17: qty 200
  Filled 2020-01-17: qty 100
  Filled 2020-01-17 (×3): qty 200
  Filled 2020-01-17 (×3): qty 100
  Filled 2020-01-17: qty 200
  Filled 2020-01-17 (×3): qty 100
  Filled 2020-01-17: qty 200
  Filled 2020-01-17: qty 100
  Filled 2020-01-17: qty 400
  Filled 2020-01-17: qty 100
  Filled 2020-01-17: qty 200
  Filled 2020-01-17: qty 100
  Filled 2020-01-17 (×2): qty 300
  Filled 2020-01-17 (×4): qty 100
  Filled 2020-01-17: qty 400
  Filled 2020-01-17: qty 200
  Filled 2020-01-17 (×2): qty 100
  Filled 2020-01-17: qty 300
  Filled 2020-01-17 (×3): qty 100
  Filled 2020-01-17 (×2): qty 200
  Filled 2020-01-17: qty 100
  Filled 2020-01-17 (×2): qty 200
  Filled 2020-01-17 (×2): qty 100
  Filled 2020-01-17 (×2): qty 200
  Filled 2020-01-17: qty 300
  Filled 2020-01-17 (×6): qty 100
  Filled 2020-01-17 (×2): qty 200
  Filled 2020-01-17 (×5): qty 100
  Filled 2020-01-17: qty 200
  Filled 2020-01-17: qty 100
  Filled 2020-01-17: qty 300
  Filled 2020-01-17: qty 200

## 2020-01-17 MED ORDER — INSULIN DETEMIR 100 UNIT/ML ~~LOC~~ SOLN
50.0000 [IU] | Freq: Two times a day (BID) | SUBCUTANEOUS | Status: DC
  Administered 2020-01-17: 50 [IU] via SUBCUTANEOUS
  Filled 2020-01-17 (×2): qty 0.5

## 2020-01-17 MED ORDER — INSULIN ASPART 100 UNIT/ML ~~LOC~~ SOLN
8.0000 [IU] | SUBCUTANEOUS | Status: DC
  Administered 2020-01-17 – 2020-01-18 (×4): 8 [IU] via SUBCUTANEOUS

## 2020-01-17 MED ORDER — FUROSEMIDE 10 MG/ML IJ SOLN
40.0000 mg | Freq: Once | INTRAMUSCULAR | Status: AC
  Administered 2020-01-17: 40 mg via INTRAVENOUS
  Filled 2020-01-17: qty 4

## 2020-01-17 MED ORDER — SENNOSIDES 8.8 MG/5ML PO SYRP
5.0000 mL | ORAL_SOLUTION | Freq: Two times a day (BID) | ORAL | Status: DC
  Administered 2020-01-17 – 2020-03-03 (×60): 5 mL
  Filled 2020-01-17 (×61): qty 5

## 2020-01-17 MED ORDER — SORBITOL 70 % SOLN
30.0000 mL | Freq: Once | Status: AC
  Administered 2020-01-17: 30 mL via ORAL
  Filled 2020-01-17: qty 30

## 2020-01-17 MED ORDER — INSULIN DETEMIR 100 UNIT/ML ~~LOC~~ SOLN
60.0000 [IU] | Freq: Two times a day (BID) | SUBCUTANEOUS | Status: DC
  Administered 2020-01-17: 60 [IU] via SUBCUTANEOUS
  Filled 2020-01-17 (×2): qty 0.6

## 2020-01-17 MED ORDER — SODIUM CHLORIDE 0.9% IV SOLUTION: Freq: Once | INTRAVENOUS | Status: AC

## 2020-01-17 MED ORDER — INSULIN ASPART 100 UNIT/ML ~~LOC~~ SOLN
6.0000 [IU] | SUBCUTANEOUS | Status: DC
  Administered 2020-01-17 (×2): 6 [IU] via SUBCUTANEOUS

## 2020-01-17 NOTE — Progress Notes (Signed)
   Patient with improved oxygenation with cannula pulled back.   No further chugging throughout the day.   Lactate down to 2.1. Glucose still running high.   Now markedly edematous. Weight up 30 pounds.   D/w Dr. [name] at bedside. Will give lasix 40 IV   Continue to adjust sedation.   Additional CCT 30 mins  Glori Bickers, MD  11:04 PM

## 2020-01-17 NOTE — Progress Notes (Signed)
Advanced Heart Failure Rounding Note   Subjective:    Remains intubated/sedated on vent. On VV ECMO.  Yesterday given IVF for low urine output. Overnight had problems with chugging and received albumin and RBCs. Had to be reparalyzed.  O2 sats falling. INVOS readings dropped as well.   This am ECMO catheter pulled back about 2 inches. Sats much improved and chugging resolved.   Speed 4100 RPM Flow 5.1L  Sweep8  Labs:  7.49/43/39/76% Hgb10.2 LDH 608 (pre-cannulation) -> 558 -> 543 -> 501 PTT on bival = 0.62 Lactic acid2.7 -> 2.5 -> 2.8  CXR with diffuse infiltrates   Vent settings FiO2 100% PEEP 12 Rate 10  TV 150  Echo EF 70-75% RV hyperdynamic    Objective:   Weight Range:  Vital Signs:   Temp:  [96.1 F (35.6 C)-98.8 F (37.1 C)] 98.2 F (36.8 C) (08/11 1715) Pulse Rate:  [70-315] 87 (08/11 1715) Resp:  [0-14] 0 (08/11 1715) BP: (106-140)/(40-60) 130/48 (08/11 1700) SpO2:  [83 %-100 %] 99 % (08/11 1715) Arterial Line BP: (107-196)/(39-168) 130/55 (08/11 1715) FiO2 (%):  [100 %] 100 % (08/11 1436) Weight:  [135.3 kg] 135.3 kg (08/11 0600) Last BM Date: 01/09/20  Weight change: Filed Weights   01/07/2020 2135 01/16/20 0500 01/17/20 0600  Weight: 130.5 kg 133.2 kg 135.3 kg    Intake/Output:   Intake/Output Summary (Last 24 hours) at 01/17/2020 1816 Last data filed at 01/17/2020 1801 Gross per 24 hour  Intake 6149.99 ml  Output 2400 ml  Net 3749.99 ml     Physical Exam: General:  Sedated/intubated No resp difficulty HEENT: normal Neck: supple. RIJ ecmo cannula. LIJ TLC Carotids 2+ bilat; no bruits. No lymphadenopathy or thryomegaly appreciated. Cor: PMI nondisplaced. Regular rate & rhythm. No rubs, gallops or murmurs. Lungs: coarse Abdomen: obese soft, nontender, nondistended. No hepatosplenomegaly. No bruits or masses. Good bowel sounds. Extremities: no cyanosis, clubbing, rash, 2-3+ edema Neuro: intubated/sedated  Telemetry: Sinus  90-100 Personally reviewed   Labs: Basic Metabolic Panel: Recent Labs  Lab 01/11/20 0312 01/12/20 0213 01/16/20 0343 01/16/20 0558 01/16/20 1100 01/16/20 1159 01/16/20 1655 01/16/20 2004 01/17/20 0351 01/17/20 0604 01/17/20 0631 01/17/20 1007 01/17/20 1213 01/17/20 1658 01/17/20 1700  NA 137   < > 155*   < > 154*   < > 152*   < > 149*   < > 152* 153* 152* 151* 150*  K 3.5   < > 4.3   < > 5.7*   < > 5.2*   < > 4.0   < > 3.7 3.7 3.9 5.6* 5.0  CL 103   < > 112*  --  113*  --  111  --  111  --   --   --   --   --  112*  CO2 26   < > 35*  --  34*  --  32  --  29  --   --   --   --   --  30  GLUCOSE 271*   < > 168*  --  181*  --  314*  --  332*  --   --   --   --   --  337*  BUN 54*   < > 72*  --  84*  --  94*  --  99*  --   --   --   --   --  97*  CREATININE 1.38*   < > 0.74  --  1.07*  --  1.34*  --  1.17*  --   --   --   --   --  1.03*  CALCIUM 6.5*   < > 8.4*   < > 8.1*   < > 8.1*  --  8.1*  --   --   --   --   --  8.4*  MG 2.2  --  3.1*  --  2.9*  --   --   --   --   --   --   --   --   --   --   PHOS 5.2*  --  2.3*  --  4.7*  --   --   --   --   --   --   --   --   --   --    < > = values in this interval not displayed.    Liver Function Tests: Recent Labs  Lab 01/13/20 0500 02/02/2020 2035 01/16/20 0343 01/16/20 0356 01/17/20 0351  AST 71* 56* 54* 54* 58*  ALT 58* 71* 64* 64* 59*  ALKPHOS 93 93 81 80 92  BILITOT 0.4 0.9 0.4 0.6 1.2  PROT 5.3* 4.5* 4.5* 4.6* 4.5*  ALBUMIN 2.6* 2.3* 2.6* 2.7* 3.0*   No results for input(s): LIPASE, AMYLASE in the last 168 hours. No results for input(s): AMMONIA in the last 168 hours.  CBC: Recent Labs  Lab 01/11/20 0312 01/11/20 0312 01/12/20 0213 01/12/20 0213 01/13/20 0500 01/13/2020 0356 01/16/20 1100 01/16/20 1159 01/16/20 1655 01/16/20 2004 01/16/20 2250 01/17/20 0054 01/17/20 0351 01/17/20 0604 01/17/20 0631 01/17/20 1007 01/17/20 1213 01/17/20 1658 01/17/20 1700  WBC 5.9   < > 6.2   < > 6.2   < > 11.4*  --   7.2  --  7.2  --  7.3  --   --   --   --   --  7.7  NEUTROABS 4.9  --  4.9  --  4.9  --   --   --   --   --   --   --   --   --   --   --   --   --   --   HGB 12.9   < > 12.6   < > 12.8   < > 10.7*   < > 10.1*   < > 9.3*   < > 10.2*   < > 9.9* 9.9* 9.9* 10.5* 10.8*  HCT 40.1   < > 40.0   < > 42.4   < > 37.0   < > 35.5*   < > 31.4*   < > 33.3*   < > 29.0* 29.0* 29.0* 31.0* 35.2*  MCV 91.6   < > 93.0   < > 94.2   < > 98.9  --  98.3  --  95.7  --  94.6  --   --   --   --   --  94.1  PLT 142*   < > 147*   < > 132*   < > 127*  --  99*  --  95*  --  91*  --   --   --   --   --  92*   < > = values in this interval not displayed.    Cardiac Enzymes: No results for input(s): CKTOTAL, CKMB, CKMBINDEX, TROPONINI in the last 168 hours.  BNP: BNP (last 3 results) No results for input(s):  BNP in the last 8760 hours.  ProBNP (last 3 results) No results for input(s): PROBNP in the last 8760 hours.    Other results:  Imaging: CARDIAC CATHETERIZATION  Result Date: 01/16/2020 See surgical note for result.  DG CHEST PORT 1 VIEW  Result Date: 01/17/2020 CLINICAL DATA:  COVID positive on ECMO EXAM: PORTABLE CHEST 1 VIEW COMPARISON:  Earlier same day FINDINGS: Endotracheal tube, left IJ central line, right PICC line, and enteric tube are again identified. ECMO cannula is also again seen. Persistent diffuse opacification of bilateral lungs. Cardiomediastinal contours are obscured. IMPRESSION: Lines and tubes as above. Similar appearance of diffuse bilateral pulmonary opacification. Electronically Signed   By: Macy Mis M.D.   On: 01/17/2020 09:34   DG CHEST PORT 1 VIEW  Result Date: 01/17/2020 CLINICAL DATA:  COVID-19 positivity EXAM: PORTABLE CHEST 1 VIEW COMPARISON:  01/16/2020 FINDINGS: Diffuse airspace opacity is identified stable in appearance from the prior exam. Feeding catheter, endotracheal tube, left jugular central line and ECMO cannula are again noted and stable. Right-sided PICC line is  noted in satisfactory position. IMPRESSION: Tubes and lines as described stable in appearance. Diffuse opacification of lungs bilaterally with minimal aeration in the central aspect of the right lung. Electronically Signed   By: Inez Catalina M.D.   On: 01/17/2020 08:25   DG CHEST PORT 1 VIEW  Result Date: 01/16/2020 CLINICAL DATA:  Cardio respiratory failure EXAM: PORTABLE CHEST 1 VIEW COMPARISON:  01/16/2020 FINDINGS: Support devices including ECMO are stable. Near complete opacification of both lungs, worsening since prior study. Difficult to visualize the heart. IMPRESSION: Worsening aeration with near complete opacification of both lungs. Electronically Signed   By: Rolm Baptise M.D.   On: 01/16/2020 22:51   DG CHEST PORT 1 VIEW  Result Date: 01/16/2020 CLINICAL DATA:  ECMO EXAM: PORTABLE CHEST 1 VIEW COMPARISON:  01/16/2020 FINDINGS: Support devices are stable. Diffuse airspace disease throughout the lungs, stable or slightly worsening since prior study. Suspect layering effusions. Heart is mildly enlarged. No acute bony abnormality. IMPRESSION: Severe diffuse bilateral airspace disease, stable or slightly worsening. Suspect layering effusions. Electronically Signed   By: Rolm Baptise M.D.   On: 01/16/2020 18:19   DG Chest Port 1 View in am  Result Date: 01/16/2020 CLINICAL DATA:  Hypoxia EXAM: PORTABLE CHEST 1 VIEW COMPARISON:  January 15, 2020. FINDINGS: Endotracheal tube tip is 3.6 cm above the carina. ECMO catheter tip is below the diaphragm. Feeding tube tip is below the diaphragm. Central catheter tip is in the superior vena cava. No pneumothorax. There are pleural effusions bilaterally with multifocal airspace opacity, stable. Heart is enlarged, stable. No adenopathy appreciable. Postoperative change noted in the lower cervical region. IMPRESSION: Tube and catheter positions as described without pneumothorax. Pleural effusions bilaterally with widespread airspace consolidation persists. Stable  cardiomegaly. Electronically Signed   By: Lowella Grip III M.D.   On: 01/16/2020 08:19   DG Chest Port 1 View  Result Date: 01/19/2020 CLINICAL DATA:  ECMO. EXAM: PORTABLE CHEST 1 VIEW COMPARISON:  Earlier today. FINDINGS: Interval right jugular ECMO catheter extending into the mid to lower abdomen, with its tip not included. Feeding tube tip in the mid stomach. Endotracheal tube tip 1.2 cm above the carina. Left jugular catheter tip in the superior vena cava. Right PICC tip in the region of the superior cavoatrial junction. Extensive dense bilateral airspace opacity with mild improvement. Interval visualization of the right heart border without visualization of the left heart borders. Mild thoracic spine  degenerative changes. IMPRESSION: 1. Interval right jugular ECMO catheter extending into the mid to lower abdomen, with its tip not included. 2. Extensive dense bilateral pneumonia for alveolar edema with mild improvement. 3. Endotracheal tube tip 1.2 cm above the carina. This could be retracted 3 cm for better positioning. These results will be called to the ordering clinician or representative by the Radiologist Assistant, and communication documented in the PACS or [employer]. Electronically Signed   By: Claudie Revering M.D.   On: 01/20/2020 21:21   ECHOCARDIOGRAM COMPLETE  Result Date: 01/16/2020    ECHOCARDIOGRAM REPORT   Patient Name:   Alisha Stephenson Date of Exam: 01/16/2020 Medical Rec #:  [id number]       Height:       64.0 in Accession #:    [id number]      Weight:       293.7 lb Date of Birth:  [birth year]        BSA:          2.303 m Patient Age:    10 years        BP:           109/49 mmHg Patient Gender: F               HR:           132 bpm. Exam Location:  Inpatient Procedure: 2D Echo, Cardiac Doppler and Color Doppler Indications:    Acute resp. insufficiency  History:        Patient has prior history of Echocardiogram examinations, most                 recent 01/10/2020.  Signs/Symptoms:Shortness of Breath; Risk                 Factors:Diabetes and Obesity. Covid, resp. failure.  Sonographer:    Dustin Flock Referring Phys: [id number] Ransom Nickson R Trev Boley IMPRESSIONS  1. Left ventricular ejection fraction, by estimation, is >75%. The left ventricle has hyperdynamic function. The left ventricle has no regional wall motion abnormalities. Left ventricular diastolic function could not be evaluated.  2. Right ventricular systolic function is hyperdynamic. The right ventricular size is normal. Tricuspid regurgitation signal is inadequate for assessing PA pressure.  3. The mitral valve is grossly normal. No evidence of mitral valve regurgitation.  4. The aortic valve was not well visualized. Aortic valve regurgitation is not visualized.  5. The inferior vena cava is normal in size with <50% respiratory variability, suggesting right atrial pressure of 8 mmHg. FINDINGS  Left Ventricle: Left ventricular ejection fraction, by estimation, is >75%. The left ventricle has hyperdynamic function. The left ventricle has no regional wall motion abnormalities. The left ventricular internal cavity size was normal in size. There is no left ventricular hypertrophy. Left ventricular diastolic function could not be evaluated. Right Ventricle: The right ventricular size is normal. No increase in right ventricular wall thickness. Right ventricular systolic function is hyperdynamic. Tricuspid regurgitation signal is inadequate for assessing PA pressure. Left Atrium: Left atrial size was normal in size. Right Atrium: Right atrial size was normal in size. Pericardium: There is no evidence of pericardial effusion. Mitral Valve: The mitral valve is grossly normal. No evidence of mitral valve regurgitation. Tricuspid Valve: The tricuspid valve is not assessed. Tricuspid valve regurgitation is not demonstrated. Aortic Valve: The aortic valve was not well visualized. Aortic valve regurgitation is not visualized.  Pulmonic Valve: The pulmonic valve was not assessed. Pulmonic valve regurgitation is not  visualized. Aorta: The aortic root was not well visualized. Venous: The inferior vena cava is normal in size with less than 50% respiratory variability, suggesting right atrial pressure of 8 mmHg. IAS/Shunts: The interatrial septum was not assessed. Dani Gobble Croitoru MD Electronically signed by Sanda Klein MD Signature Date/Time: 01/16/2020/9:56:05 AM    Final      Medications:     Scheduled Medications: . artificial tears  1 application Both Eyes W2N  . vitamin C  500 mg Per Tube Daily  . chlorhexidine gluconate (MEDLINE KIT)  15 mL Mouth Rinse BID  . Chlorhexidine Gluconate Cloth  6 each Topical Daily  . cisatracurium  10 mg Intravenous Once  . clonazePAM  1 mg Per Tube BID  . docusate  100 mg Per Tube BID  . feeding supplement (PROSource TF)  45 mL Per Tube Daily  . fentaNYL (SUBLIMAZE) injection  50 mcg Intravenous Once  . free water  200 mL Per Tube Q2H  . insulin aspart  0-20 Units Subcutaneous Q4H  . insulin aspart  8 Units Subcutaneous Q4H  . insulin detemir  60 Units Subcutaneous BID  . mouth rinse  15 mL Mouth Rinse 10 times per day  . methylPREDNISolone (SOLU-MEDROL) injection  40 mg Intravenous Daily  . nutrition supplement (JUVEN)  1 packet Per Tube BID BM  . oxyCODONE  5 mg Per Tube Q6H  . pantoprazole sodium  40 mg Per Tube Daily  . polyethylene glycol  17 g Per Tube Daily  . sennosides  5 mL Per Tube BID  . sodium chloride flush  10-40 mL Intracatheter Q12H  . zinc sulfate  220 mg Per Tube Daily    Infusions: . sodium chloride Stopped (02/01/2020 2352)  . sodium chloride Stopped (02/01/2020 1009)  . albumin human Stopped (01/17/20 0555)  . bivalirudin (ANGIOMAX) infusion 0.5 mg/mL (Non-ACS indications) 0.07 mg/kg/hr (01/17/20 1700)  . cisatracurium (NIMBEX) infusion Stopped (01/17/20 1227)  . dexmedetomidine (PRECEDEX) IV infusion 0.6 mcg/kg/hr (01/17/20 1700)  . dextrose 5 % and  0.45% NaCl Stopped (01/25/2020 0827)  . feeding supplement (PIVOT 1.5 CAL) 50 mL/hr at 01/17/20 1600  . fentaNYL infusion INTRAVENOUS 250 mcg/hr (01/17/20 1700)  . midazolam 5 mg/hr (01/17/20 1700)  . norepinephrine (LEVOPHED) Adult infusion Stopped (01/17/20 0841)    PRN Medications: sodium chloride, acetaminophen (TYLENOL) oral liquid 160 mg/5 mL, albumin human, albuterol, fentaNYL, fentaNYL, guaiFENesin-dextromethorphan, hydrALAZINE, hydrOXYzine, midazolam, ondansetron **OR** ondansetron (ZOFRAN) IV, sodium chloride flush   Assessment/Plan:   1. Acute hypoxic/hypercapneic respiratory failure in setting of severe COVID PNA/ARDS -> VV ECMO - admit 8/2 - intubation 8/3 - has received actmera (compelted 8/2), remdesivir (completed 8/6) and steroids - failed full vent support with proning/paralytic - Cannulated for VV ECMO on 8/9 - Overnight had problems with chugging and received albumin and RBCs. Had to be reparalyzed.  O2 sats falling. INVOS readings dropped as well. -> now improved with ECMO cannula repositioning - Continue lung rest strategy - Will likely need trach soon  - Will wean paralytics and try to wean sedation to Precedex with versed boluses   2. Morbid obesity - Body mass index is 49.38 kg/m.  3. Poorly controlled DM2 - HgBA1c 10.7 - Adjust insulin as needed  4. Hypernatremia - continue free water boluses   CRITICAL CARE Performed by: Glori Bickers  Total critical care time: 60 minutes  Critical care time was exclusive of separately billable procedures and treating other patients.  Critical care was necessary to treat or prevent imminent  or life-threatening deterioration.  Critical care was time spent personally by me (independent of midlevel providers or residents) on the following activities: development of treatment plan with patient and/or surrogate as well as nursing, discussions with consultants, evaluation of patient's response to treatment,  examination of patient, obtaining history from patient or surrogate, ordering and performing treatments and interventions, ordering and review of laboratory studies, ordering and review of radiographic studies, pulse oximetry and re-evaluation of patient's condition.   Length of Stay: 9   Glori Bickers  MD 01/17/2020, 6:16 PM  Advanced Heart Failure Team Pager [phone] (M-F; [place])  Please contact [facility] for night-coverage after hours (4p -7a ) and weekends on [email or web address]

## 2020-01-17 NOTE — Progress Notes (Addendum)
[place] for bivalirudin Indication: VV ECMO  Allergies  Allergen Reactions  . Sulfa Antibiotics Rash and Other (See Comments)    Patient Measurements: Height: 5\' 4"  (162.6 cm) Weight: 135.3 kg (298 lb 4.5 oz) IBW/kg (Calculated) : 54.7 Heparin Dosing Weight: 87kg  Vital Signs: Temp: 96.4 F (35.8 C) (08/11 1030) Temp Source: Bladder (08/11 0238) BP: 114/50 (08/11 1000) Pulse Rate: 75 (08/11 1030)  Labs: Recent Labs    01/23/2020 0356 01/09/2020 2023 01/28/2020 2035 01/23/2020 2053 01/16/20 0356 01/16/20 0558 01/16/20 1100 01/16/20 1159 01/16/20 1655 01/16/20 1656 01/16/20 2250 01/16/20 2327 01/17/20 0054 01/17/20 0351 01/17/20 0351 01/17/20 0604 01/17/20 0604 01/17/20 0623 01/17/20 0631 01/17/20 0751  HGB   < >  --  11.2*   < >  --    < > 10.7*   < > 10.1*   < > 9.3*  --    < > 10.2*   < > 9.9*   < > 9.5* 9.9*  --   HCT   < >  --  37.6   < >  --    < > 37.0   < > 35.5*   < > 31.4*  --    < > 33.3*   < > 29.0*  --  28.0* 29.0*  --   PLT   < >  --  99*   < >  --    < > 127*   < > 99*  --  95*  --   --  91*  --   --   --   --   --   --   APTT  --   --  >200*   < > 51*   < > 50*  --   --    < >  --  62*  --  96*  --   --   --   --   --  62*  LABPROT  --   --  17.5*  --  18.4*  --   --   --   --   --   --   --   --  23.2*  --   --   --   --   --   --   INR  --   --  1.5*  --  1.6*  --   --   --   --   --   --   --   --  2.1*  --   --   --   --   --   --   HEPARINUNFRC  --  1.06*  --   --   --   --   --   --   --   --   --   --   --   --   --   --   --   --   --   --   CREATININE   < >  --  0.85   < >  --    < > 1.07*  --  1.34*  --   --   --   --  1.17*  --   --   --   --   --   --    < > = values in this interval not displayed.    Estimated Creatinine Clearance: 74.5 mL/min (A) (by C-G formula based on SCr of 1.17 mg/dL (H)).   Medical  History: Past Medical History:  Diagnosis Date  . Asthma   . Diabetes mellitus without  complication ([place])   . Diverticulitis   . Gallstones   . IBS (irritable bowel syndrome)   . NAFLD (nonalcoholic fatty liver disease)    CT scan 2015  . Ovarian cyst     Assessment: 54 year old female admitted with COVID s/p treatment with antivirals completed 8/6. Now with continued low saturations and decision was made to transfer to [facility] for ECMO cannulation and continue with VV ECMO.   APTT came back therapeutic at 62, on 0.068 mg/kg/hr. Hgb 10.2, plt 91. Fibrinogen 213. LDH 501. No s/sx of bleeding or infusion issues.    Goal of Therapy:  aPTT 50-80 seconds Monitor platelets by anticoagulation protocol: Yes   Plan:  Continue bivalirudin at 0.068 mg/kg/hr Aptt in 4 hours Will plan for 0500/1700 aptt checks once dose is stable  Antonietta Jewel, PharmD, [place] Pharmacist  Phone: [phone] 01/17/2020 10:49 AM  Please check AMION for all [place] [place] phone numbers After 10:00 PM, call [place] [phone]  ADDENDUM APTT came back therapeutic at 59, on 0.068 mg/kg/hr. Hgb 9.9, plt 91. Fibrinogen 213. LDH 501. Will increase to 0.07 mg/kg/hr and check in 4 hours - okay to transition to q12 hr checks given in goal range.  Antonietta Jewel, PharmD, [place] Clinical Pharmacist  Phone: [phone] 01/17/2020 1:55 PM  Please check AMION for all [place] phone numbers After 10:00 PM, call [place] [phone]

## 2020-01-17 NOTE — Progress Notes (Signed)
Pt ECMO cannula pulled back 2in by Dr. Haroldine Laws and Dr. Orvan Seen. ECMO specialist, Idelia Salm RN at bedside. Pt monitor O2 sat increased to 100% after pulling back cannula, flows increased from 4.7-4.9. New dressing and foley anchor applied to site. After procedure ECMO specialist was able to increase RMPS to get flows to 5LPM.

## 2020-01-17 NOTE — Progress Notes (Signed)
All critical values reported to Dr. [name] and ECMO coordinator. Pt given a total of 4 albumins overnight to maintain flow and prevent chugging. 1 RBC given to promote better oxygenation. PEEP increased to 15 and PC decreased to 10.  Pt now getting tidal volumes in the 30's. Flows on ECMO circuit were both increased, in attempt to improve oxygenation, and decreased, in attempt to prevent recirculation. Some flashing noted in drainage cannulation- increasing and decreasing flows both did not result in improved oxygenation. Pt sats only improved once patient was placed in reverse trendelenburg position with head facing the window.   Pre oxygenation gas drawn per ECMO coordinator at [phone] (7.43/43.1/39/29.2/76%) and post oxygenator gas drawn at 0631(7.49/43.1/521/29.2/100%). Pt is on 8.5 sweep. AM CXR completed in supine position as this was patients position when patient arrived on unit after ECMO cannulation. Dr. [name] at bedside many times throughout the night and relayed concerns about cannula position with AM rounding team.

## 2020-01-17 NOTE — Progress Notes (Signed)
At beginning of shift, pt had continued issues with chugging- initially responsive to paralytic per day shift but required 2 albumins to resolve chugging tonight. Reduced flows to 3.5 with sats maintaining >90%. Sats slowly started trending down so ABG was drawn. Flows were increased up back up to 5L/min on 4000 RPMs to help resolve oxygenation on ABG. 1 Hour later, another ABG drawn with no resolve again. Some recirculation was noted in drainage cannula- flow subsequently decreased to 4.8 to limit this. Still some flashing but pt sating now 86%. Contacted Dr. [name] and STAT CBC, Lactic, and CXR were completed. MD came to bedside- increased PEEP to 14 and PC to 10- pt now with VTs in the 30's. Minute ventilation alarm continues to alarm- MD aware. All critical lab values were reported. Orders were received for 1 RBC to be transfused.

## 2020-01-17 NOTE — CV Procedure (Signed)
ECMO NOTE:  Indication: Respiratory failure due to COVID PNA  Initial cannulation date: 01/21/2020  ECMO type: VV ECMO (Centrimar with oximizer)  Dual lumen Inflow/return cannula:  1) 32 FR RIJ [place] placed 02/02/2020  ECMO events:  - Initial cannulation 02/06/2020 - Cannula repositioned (pulled back ) 01/17/20   Daily data:  Flow 4.8L RPM 4000 Sweep 8  Labs:  7.49/43/39/76% Hgb10.2 LDH 608 (pre-cannulation) -> 558 -> 543 -> 501 PTT on bival = 0.62 Lactic acid 2.7 -> 2.5 -> 2.8  Plan:  Continue ECMO support. Oxygenation dropping. Requiring more volume to prevent chugging overnight ECMO cannula repositioned this am and seems improved. Follow gasses closely Will try to wean off paralytics. Once off paralytic will switch sedation  Trach soon Consider gentle diuresis    Discussed in multidisciplinary fashion on ECMO rounds with TCTS, CCM, ECMO coordinator/specialist, RT, PharmD and nursing staff all present.   Glori Bickers, MD  10:00 AM

## 2020-01-17 NOTE — Progress Notes (Signed)
NAME:  Alisha Stephenson, MRN:  [id number], DOB:  Dec 05, 1965, LOS: 9 ADMISSION DATE:  01/10/2020, CONSULTATION DATE:  8/3 REFERRING MD:  Alfredia Ferguson, CHIEF COMPLAINT:  Dyspnea   Brief History   54 y/o female admitted on 8/2 with severe acute respiratory failure with hypoxemia due to COVID 19 pneumonia.  She developed symptoms 1 week prior to admission.  Past Medical History  DM2 Diverticulitis Gallstones Ovarian cyst NAFLD Asthma  Significant Hospital Events   8/2 admit 8/3 ICU transfer, intubated 8/4 prone, paralyze 8/9 significant desaturations today 8/9 VV ECMO cannulation  Consults:  PCCM ECMO team    Procedures:  8/3 ETT >  8/3 PICC >  8/9 RIJ [place] 42F   Significant Diagnostic Tests:  7/31 CT head > NAICP 7/31 MRI/MRA brain > no acute changes, possibly small aneurysm ACOM  Micro Data:  8/2 blood > NG 8/2 SARS COV 2 > positive 8/4 resp > negative 8/4 urine >   Antimicrobials:  8/2 remdesivir > 8/6 8/2 actemra  8/2 solumedrol >   8/3 ceftriaxone >  8/5 8/3 azithro >  8/5  Interim history/subjective:  Progressively more hypoxic throughout the day yesterday. Chugging- albumin given x 4 times overnight. 1 unit pRBCs given overnight.    Objective   Blood pressure (!) 119/46, pulse 75, temperature 97.9 F (36.6 C), resp. rate 10, height 5\' 4"  (1.626 m), weight 135.3 kg, SpO2 90 %. CVP:  [10 mmHg-16 mmHg] 11 mmHg  Vent Mode: PCV FiO2 (%):  [100 %] 100 % Set Rate:  [10 bmp] 10 bmp PEEP:  [12 cmH20-14 cmH20] 14 cmH20 Plateau Pressure:  [20 cmH20-24 cmH20] 22 cmH20   Intake/Output Summary (Last 24 hours) at 01/17/2020 0744 Last data filed at 01/17/2020 0700 Gross per 24 hour  Intake 5922.84 ml  Output 1505 ml  Net 4417.84 ml   Filed Weights   01/09/2020 2135 01/16/20 0500 01/17/20 0600  Weight: 130.5 kg 133.2 kg 135.3 kg    Examination:  General: middle aged woman, critically ill appearing. Intubated, sedated, under NMB. HENT: [place]/AT, scleral edema but  anicteric PULM: absent breath sounds, minimal tan secretions from ETT CV: Sinus tachycardia on telemetry GI: Soft, obese, nondistended MSK: Appropriate muscle tone for age, pitting edema bilateral lower extremities Neuro: RASS -5,sedated and paralyzed  Vent 10/ PS 10 + PEEP 14/ 100% with Vt ~40cc  Circuit: 4000RPM, 4.78L/min  Resolved Hospital Problem list     Assessment & Plan:   ARDS due to COVID 19 pneumonia > worsening oxygenation again on 8/6, bilateral effusions, presumed acute pulmonary edema VV ECMO cannulation on 8/9 for refractory hypoxemia; now with persistent hypoxemia despite ECMO, worsened in the past 24 hrs. -con't lung protective ventilation- PEEP 15, PS 10, FiO2 remains high on 100% -flow 4.5 RPM 4500 sweep 8.5 -Goal net negative fluid balane as able- hasn't been tolerating due to chugging -repositioning cannula today due to ineffective circulation of circuit -Continue NMB PRN for significant dyssynchrony causing desaturation or other hemodynamic instability. -Continue Solu-Medrol per protocol; previously completed remdesivir per protocol.  Tocilizumab given on 8/2. -Goal RASS -4 to -5 if requiring NMB -VAP prevention protocol -Continue to monitor off antibiotics; no clinical evidence of bacterial pneumonia -bivalirudin for [facility] on ECMO  Need for sedation for mechanical ventilation Ventilator dyssynchrony  -Continue heavy sedation until able to wean paralytic -Continue fentanyl/versed infusions + orals oxycodone and clonazepam  AKI> improved -Continue to monitor daily -Goal of euvolemia -Electrolyte repletion as needed  Lactic acidosis, likely due  to ongoing persistent hypoxia -con't to monitor -con't supportive measures to improve oxygenation  Hyperglycemia > not controlled despite 140 units insulin past 24 hrs -Goal BG 140-180 while admitted to the ICU -increasing levemir and novolog TF coverage  Hypernatremia -free water repletion 200cc Q2h -con't  to monitor  Acute anemia due to critical illness Thrombocytopenia due to critical illness -transfuse for Hb <8 -con't to monitor    Best practice:  Diet: tube feeding Pain/Anxiety/Delirium protocol (if indicated): as above VAP protocol (if indicated): yes DVT prophylaxis: bivalirudin GI prophylaxis: famotidine Glucose control: Basal bolus insulin Mobility: bed rest Code Status: full Family Communication: husband [name] updated 8/11 Disposition: ICU  Labs   CBC: Recent Labs  Lab 01/11/20 0312 01/11/20 0312 01/12/20 0213 01/12/20 0213 01/13/20 0500 01/19/2020 0356 01/16/20 0341 01/16/20 0558 01/16/20 1100 01/16/20 1159 01/16/20 1655 01/16/20 2004 01/16/20 2250 01/17/20 0054 01/17/20 0217 01/17/20 0351 01/17/20 0604 01/17/20 0623 01/17/20 0631  WBC 5.9   < > 6.2   < > 6.2   < > 5.4  --  11.4*  --  7.2  --  7.2  --   --  7.3  --   --   --   NEUTROABS 4.9  --  4.9  --  4.9  --   --   --   --   --   --   --   --   --   --   --   --   --   --   HGB 12.9   < > 12.6   < > 12.8   < > 10.4*   < > 10.7*   < > 10.1*   < > 9.3*   < > 9.9* 10.2* 9.9* 9.5* 9.9*  HCT 40.1   < > 40.0   < > 42.4   < > 35.5*   < > 37.0   < > 35.5*   < > 31.4*   < > 29.0* 33.3* 29.0* 28.0* 29.0*  MCV 91.6   < > 93.0   < > 94.2   < > 95.9  --  98.9  --  98.3  --  95.7  --   --  94.6  --   --   --   PLT 142*   < > 147*   < > 132*   < > 107*  --  127*  --  99*  --  95*  --   --  91*  --   --   --    < > = values in this interval not displayed.    Basic Metabolic Panel: Recent Labs  Lab 01/10/20 1212 01/10/20 1811 01/11/20 0312 01/12/20 0213 01/21/2020 2035 01/07/2020 2053 01/16/20 0343 01/16/20 0558 01/16/20 1100 01/16/20 1159 01/16/20 1655 01/16/20 2004 01/17/20 0217 01/17/20 0351 01/17/20 0604 01/17/20 0623 01/17/20 0631  NA   < >  --  137   < > 147*   < > 155*   < > 154*   < > 152*   < > 151* 149* 153* 152* 152*  K   < >  --  3.5   < > 5.4*   < > 4.3   < > 5.7*   < > 5.2*   < > 4.2 4.0  3.8 3.8 3.7  CL  --   --  103   < > 105  --  112*  --  113*  --  111  --   --  111  --   --   --   CO2  --   --  26   < > 31  --  35*  --  34*  --  32  --   --  29  --   --   --   GLUCOSE  --   --  271*   < > 376*  --  168*  --  181*  --  314*  --   --  332*  --   --   --   BUN  --   --  54*   < > 71*  --  72*  --  84*  --  94*  --   --  99*  --   --   --   CREATININE  --   --  1.38*   < > 0.85  --  0.74  --  1.07*  --  1.34*  --   --  1.17*  --   --   --   CALCIUM  --   --  6.5*   < > 7.7*  --  8.4*  --  8.1*  --  8.1*  --   --  8.1*  --   --   --   MG  --  2.1 2.2  --   --   --  3.1*  --  2.9*  --   --   --   --   --   --   --   --   PHOS  --  5.1* 5.2*  --   --   --  2.3*  --  4.7*  --   --   --   --   --   --   --   --    < > = values in this interval not displayed.   GFR: Estimated Creatinine Clearance: 74.5 mL/min (A) (by C-G formula based on SCr of 1.17 mg/dL (H)). Recent Labs  Lab 01/11/20 0312 01/12/20 0213 01/16/20 0341 01/16/20 0344 01/16/20 1100 01/16/20 1230 01/16/20 1655 01/16/20 2250 01/16/20 2327 01/17/20 0351  PROCALCITON 0.98  --   --   --   --   --   --   --   --   --   WBC 5.9   < >   < >  --  11.4*  --  7.2 7.2  --  7.3  LATICACIDVEN  --    < >  --  2.5*  --  1.7  --   --  2.4* 2.8*   < > = values in this interval not displayed.    Liver Function Tests: Recent Labs  Lab 01/13/20 0500 01/20/2020 2035 01/16/20 0343 01/16/20 0356 01/17/20 0351  AST 71* 56* 54* 54* 58*  ALT 58* 71* 64* 64* 59*  ALKPHOS 93 93 81 80 92  BILITOT 0.4 0.9 0.4 0.6 1.2  PROT 5.3* 4.5* 4.5* 4.6* 4.5*  ALBUMIN 2.6* 2.3* 2.6* 2.7* 3.0*   No results for input(s): LIPASE, AMYLASE in the last 168 hours. No results for input(s): AMMONIA in the last 168 hours.  ABG    Component Value Date/Time   PHART 7.497 (H) 01/17/2020 0631   PCO2ART 43.1 01/17/2020 0631   PO2ART 521 (H) 01/17/2020 0631   HCO3 33.5 (H) 01/17/2020 0631   TCO2 35 (H) 01/17/2020 0631   ACIDBASEDEF 2.0  01/11/2020 1203   O2SAT 100.0 01/17/2020 0631     Coagulation Profile:  Recent Labs  Lab 01/12/2020 2035 01/16/20 0356 01/17/20 0351  INR 1.5* 1.6* 2.1*    Cardiac Enzymes: No results for input(s): CKTOTAL, CKMB, CKMBINDEX, TROPONINI in the last 168 hours.  HbA1C: Hgb A1c MFr Bld  Date/Time Value Ref Range Status  01/31/2020 03:56 AM 10.7 (H) 4.8 - 5.6 % Final    Comment:    (NOTE) Pre diabetes:          5.7%-6.4%  Diabetes:              >6.4%  Glycemic control for   <7.0% adults with diabetes   09/04/2019 03:22 PM 9.9 (H) 4.6 - 6.5 % Final    Comment:    Glycemic Control Guidelines for People with Diabetes:Non Diabetic:  <6%Goal of Therapy: <7%Additional Action Suggested:  >8%     CBG: Recent Labs  Lab 01/16/20 1556 01/16/20 2002 01/16/20 2003 01/16/20 2330 01/17/20 0355  GLUCAP 249* 403* 382* 371* 296*     This patient is critically ill with multiple organ system failure which requires frequent high complexity decision making, assessment, support, evaluation, and titration of therapies. This was completed through the application of advanced monitoring technologies and extensive interpretation of multiple databases. During this encounter critical care time was devoted to patient care services described in this note for 60 minutes.  Julian Hy, DO 01/17/20 7:45 AM [facility]

## 2020-01-17 NOTE — Progress Notes (Signed)
[place] for bivalirudin Indication: VV ECMO  Allergies  Allergen Reactions  . Sulfa Antibiotics Rash and Other (See Comments)    Patient Measurements: Height: 5\' 4"  (162.6 cm) Weight: 135.3 kg (298 lb 4.5 oz) IBW/kg (Calculated) : 54.7 Heparin Dosing Weight: 87kg  Vital Signs: Temp: 98.1 F (36.7 C) (08/11 1830) BP: 115/46 (08/11 1800) Pulse Rate: 71 (08/11 1830)  Labs: Recent Labs    01/11/2020 0356 01/29/2020 2023 01/27/2020 2035 02/02/2020 2053 01/16/20 0356 01/16/20 0558 01/16/20 1655 01/16/20 1656 01/16/20 2250 01/16/20 2327 01/17/20 0351 01/17/20 0604 01/17/20 0751 01/17/20 1007 01/17/20 1213 01/17/20 1213 01/17/20 1224 01/17/20 1658 01/17/20 1700  HGB   < >  --  11.2*   < >  --    < > 10.1*   < > 9.3*   < > 10.2*   < >  --    < > 9.9*   < >  --  10.5* 10.8*  HCT   < >  --  37.6   < >  --    < > 35.5*   < > 31.4*   < > 33.3*   < >  --    < > 29.0*  --   --  31.0* 35.2*  PLT   < >  --  99*   < >  --    < > 99*   < > 95*  --  91*  --   --   --   --   --   --   --  92*  APTT  --   --  >200*   < > 51*   < >  --    < >  --    < > 96*   < > 62*  --   --   --  59*  --  52*  LABPROT  --   --  17.5*  --  18.4*  --   --   --   --   --  23.2*  --   --   --   --   --   --   --   --   INR  --   --  1.5*  --  1.6*  --   --   --   --   --  2.1*  --   --   --   --   --   --   --   --   HEPARINUNFRC  --  1.06*  --   --   --   --   --   --   --   --   --   --   --   --   --   --   --   --   --   CREATININE   < >  --  0.85   < >  --    < > 1.34*  --   --   --  1.17*  --   --   --   --   --   --   --  1.03*   < > = values in this interval not displayed.    Estimated Creatinine Clearance: 84.7 mL/min (A) (by C-G formula based on SCr of 1.03 mg/dL (H)).   Medical History: Past Medical History:  Diagnosis Date  . Asthma   . Diabetes mellitus without complication ([place])   . Diverticulitis   .  Gallstones   . IBS (irritable bowel syndrome)    . NAFLD (nonalcoholic fatty liver disease)    CT scan 2015  . Ovarian cyst     Assessment: 54 year old female admitted with COVID s/p treatment with antivirals completed 8/6. Now with continued low saturations and decision was made to transfer to [facility] for ECMO cannulation and continue with VV ECMO.   APTT came back therapeutic at 52, on 0.07 mg/kg/hr. No s/sx of bleeding or infusion issues.    Goal of Therapy:  aPTT 50-80 seconds Monitor platelets by anticoagulation protocol: Yes   Plan:  Continue bivalirudin at 0.068 mg/kg/hr F/u AM aPTT  Will plan for 0500/1700 aptt checks once dose is stable   Albertina Parr, PharmD., BCPS, BCCCP Clinical Pharmacist Clinical phone for 01/17/20 until 5pm: [phone] If after 5pm, please refer to [facility] for unit-specific pharmacist

## 2020-01-17 NOTE — Progress Notes (Addendum)
ANTICOAGULATION CONSULT NOTE - Follow Up Consult  Pharmacy Consult for bivalirudin Indication: ECMO  Labs: Recent Labs    01/13/2020 0356 01/10/2020 2023 01/19/2020 2035 01/09/2020 2053 01/16/20 0012 01/16/20 0343 01/16/20 0356 01/16/20 0558 01/16/20 1100 01/16/20 1159 01/16/20 1655 01/16/20 1656 01/16/20 2004 01/16/20 2108 01/16/20 2108 01/16/20 2249 01/16/20 2250 01/16/20 2327  HGB   < >  --  11.2*   < >  --   --   --    < > 10.7*   < > 10.1*  --    < > 8.8*   < > 9.2* 9.3*  --   HCT   < >  --  37.6   < >  --   --   --    < > 37.0   < > 35.5*  --    < > 26.0*  --  27.0* 31.4*  --   PLT   < >  --  99*   < >  --   --   --    < > 127*  --  99*  --   --   --   --   --  95*  --   APTT  --   --  >200*   < >   < >  --  51*   < > 50*  --   --  53*  --   --   --   --   --  62*  LABPROT  --   --  17.5*  --   --   --  18.4*  --   --   --   --   --   --   --   --   --   --   --   INR  --   --  1.5*  --   --   --  1.6*  --   --   --   --   --   --   --   --   --   --   --   HEPARINUNFRC  --  1.06*  --   --   --   --   --   --   --   --   --   --   --   --   --   --   --   --   CREATININE   < >  --  0.85   < >  --  0.74  --   --  1.07*  --  1.34*  --   --   --   --   --   --   --    < > = values in this interval not displayed.    Assessment/Plan:  54yo female therapeutic on bivalirudin after rate increases. Will continue gtt at current rate and confirm stable with am labs.   Wynona Neat, PharmD, BCPS  01/17/2020,12:25 AM   Addendum: PTT now above goal at 96; RN reports chugging on ECMO circuit.  Will decrease bivalirudin by 20% and check PTT in 2-4hr.  VB 4:39 AM

## 2020-01-18 ENCOUNTER — Inpatient Hospital Stay (HOSPITAL_COMMUNITY): Payer: PRIVATE HEALTH INSURANCE

## 2020-01-18 DIAGNOSIS — Z9911 Dependence on respirator [ventilator] status: Secondary | ICD-10-CM | POA: Diagnosis not present

## 2020-01-18 DIAGNOSIS — E872 Acidosis: Secondary | ICD-10-CM

## 2020-01-18 DIAGNOSIS — U071 COVID-19: Secondary | ICD-10-CM | POA: Diagnosis not present

## 2020-01-18 DIAGNOSIS — J9600 Acute respiratory failure, unspecified whether with hypoxia or hypercapnia: Secondary | ICD-10-CM | POA: Diagnosis not present

## 2020-01-18 DIAGNOSIS — R739 Hyperglycemia, unspecified: Secondary | ICD-10-CM | POA: Diagnosis not present

## 2020-01-18 DIAGNOSIS — E139 Other specified diabetes mellitus without complications: Secondary | ICD-10-CM | POA: Diagnosis not present

## 2020-01-18 LAB — CBC
HCT: 35 % — ABNORMAL LOW (ref 36.0–46.0)
HCT: 35.5 % — ABNORMAL LOW (ref 36.0–46.0)
Hemoglobin: 10.8 g/dL — ABNORMAL LOW (ref 12.0–15.0)
Hemoglobin: 10.9 g/dL — ABNORMAL LOW (ref 12.0–15.0)
MCH: 28.6 pg (ref 26.0–34.0)
MCH: 28.6 pg (ref 26.0–34.0)
MCHC: 30.9 g/dL (ref 30.0–36.0)
MCHC: 30.7 g/dL (ref 30.0–36.0)
MCV: 92.8 fL (ref 80.0–100.0)
MCV: 93.2 fL (ref 80.0–100.0)
Platelets: 63 10*3/uL — ABNORMAL LOW (ref 150–400)
Platelets: 37 10*3/uL — ABNORMAL LOW (ref 150–400)
RBC: 3.77 MIL/uL — ABNORMAL LOW (ref 3.87–5.11)
RBC: 3.81 MIL/uL — ABNORMAL LOW (ref 3.87–5.11)
RDW: 15.7 % — ABNORMAL HIGH (ref 11.5–15.5)
RDW: 15.7 % — ABNORMAL HIGH (ref 11.5–15.5)
WBC: 9 10*3/uL (ref 4.0–10.5)
WBC: 11.4 10*3/uL — ABNORMAL HIGH (ref 4.0–10.5)
nRBC: 2 % — ABNORMAL HIGH (ref 0.0–0.2)
nRBC: 1.3 % — ABNORMAL HIGH (ref 0.0–0.2)

## 2020-01-18 LAB — BPAM RBC
Blood Product Expiration Date: 202109012359
Blood Product Expiration Date: 202109012359
Blood Product Expiration Date: 202109012359
Blood Product Expiration Date: 202109022359
Blood Product Expiration Date: 202109072359
ISSUE DATE / TIME: 202108061051
ISSUE DATE / TIME: 202108061051
ISSUE DATE / TIME: 202108061051
ISSUE DATE / TIME: 202108110059
Unit Type and Rh: 5100
Unit Type and Rh: 5100
Unit Type and Rh: 5100
Unit Type and Rh: 5100
Unit Type and Rh: 5100

## 2020-01-18 LAB — URINALYSIS, ROUTINE W REFLEX MICROSCOPIC
Bilirubin Urine: NEGATIVE
Glucose, UA: NEGATIVE mg/dL
Ketones, ur: NEGATIVE mg/dL
Leukocytes,Ua: NEGATIVE
Nitrite: NEGATIVE
Protein, ur: NEGATIVE mg/dL
Specific Gravity, Urine: 1.016 (ref 1.005–1.030)
pH: 5 (ref 5.0–8.0)

## 2020-01-18 LAB — POCT I-STAT 7, (LYTES, BLD GAS, ICA,H+H)
Acid-Base Excess: 5 mmol/L — ABNORMAL HIGH (ref 0.0–2.0)
Acid-Base Excess: 4 mmol/L — ABNORMAL HIGH (ref 0.0–2.0)
Acid-Base Excess: 4 mmol/L — ABNORMAL HIGH (ref 0.0–2.0)
Acid-Base Excess: 3 mmol/L — ABNORMAL HIGH (ref 0.0–2.0)
Acid-Base Excess: 1 mmol/L (ref 0.0–2.0)
Acid-Base Excess: 4 mmol/L — ABNORMAL HIGH (ref 0.0–2.0)
Acid-Base Excess: 4 mmol/L — ABNORMAL HIGH (ref 0.0–2.0)
Acid-Base Excess: 3 mmol/L — ABNORMAL HIGH (ref 0.0–2.0)
Bicarbonate: 29.6 mmol/L — ABNORMAL HIGH (ref 20.0–28.0)
Bicarbonate: 28.3 mmol/L — ABNORMAL HIGH (ref 20.0–28.0)
Bicarbonate: 28.6 mmol/L — ABNORMAL HIGH (ref 20.0–28.0)
Bicarbonate: 28.1 mmol/L — ABNORMAL HIGH (ref 20.0–28.0)
Bicarbonate: 26 mmol/L (ref 20.0–28.0)
Bicarbonate: 27.6 mmol/L (ref 20.0–28.0)
Bicarbonate: 28 mmol/L (ref 20.0–28.0)
Bicarbonate: 27.5 mmol/L (ref 20.0–28.0)
Calcium, Ion: 1.13 mmol/L — ABNORMAL LOW (ref 1.15–1.40)
Calcium, Ion: 1.22 mmol/L (ref 1.15–1.40)
Calcium, Ion: 1.15 mmol/L (ref 1.15–1.40)
Calcium, Ion: 1.19 mmol/L (ref 1.15–1.40)
Calcium, Ion: 1.17 mmol/L (ref 1.15–1.40)
Calcium, Ion: 1.18 mmol/L (ref 1.15–1.40)
Calcium, Ion: 1.18 mmol/L (ref 1.15–1.40)
Calcium, Ion: 1.15 mmol/L (ref 1.15–1.40)
HCT: 29 % — ABNORMAL LOW (ref 36.0–46.0)
HCT: 31 % — ABNORMAL LOW (ref 36.0–46.0)
HCT: 32 % — ABNORMAL LOW (ref 36.0–46.0)
HCT: 32 % — ABNORMAL LOW (ref 36.0–46.0)
HCT: 34 % — ABNORMAL LOW (ref 36.0–46.0)
HCT: 31 % — ABNORMAL LOW (ref 36.0–46.0)
HCT: 31 % — ABNORMAL LOW (ref 36.0–46.0)
HCT: 31 % — ABNORMAL LOW (ref 36.0–46.0)
Hemoglobin: 9.9 g/dL — ABNORMAL LOW (ref 12.0–15.0)
Hemoglobin: 10.5 g/dL — ABNORMAL LOW (ref 12.0–15.0)
Hemoglobin: 10.9 g/dL — ABNORMAL LOW (ref 12.0–15.0)
Hemoglobin: 10.9 g/dL — ABNORMAL LOW (ref 12.0–15.0)
Hemoglobin: 11.6 g/dL — ABNORMAL LOW (ref 12.0–15.0)
Hemoglobin: 10.5 g/dL — ABNORMAL LOW (ref 12.0–15.0)
Hemoglobin: 10.5 g/dL — ABNORMAL LOW (ref 12.0–15.0)
Hemoglobin: 10.5 g/dL — ABNORMAL LOW (ref 12.0–15.0)
O2 Saturation: 89 %
O2 Saturation: 92 %
O2 Saturation: 88 %
O2 Saturation: 89 %
O2 Saturation: 84 %
O2 Saturation: 92 %
O2 Saturation: 96 %
O2 Saturation: 96 %
Patient temperature: 36
Patient temperature: 36.3
Patient temperature: 35.8
Patient temperature: 35.6
Patient temperature: 36.7
Patient temperature: 36.4
Patient temperature: 36.2
Patient temperature: 36
Potassium: 4 mmol/L (ref 3.5–5.1)
Potassium: 3.7 mmol/L (ref 3.5–5.1)
Potassium: 5 mmol/L (ref 3.5–5.1)
Potassium: 4.7 mmol/L (ref 3.5–5.1)
Potassium: 4.6 mmol/L (ref 3.5–5.1)
Potassium: 4.2 mmol/L (ref 3.5–5.1)
Potassium: 4.2 mmol/L (ref 3.5–5.1)
Potassium: 4.1 mmol/L (ref 3.5–5.1)
Sodium: 152 mmol/L — ABNORMAL HIGH (ref 135–145)
Sodium: 154 mmol/L — ABNORMAL HIGH (ref 135–145)
Sodium: 149 mmol/L — ABNORMAL HIGH (ref 135–145)
Sodium: 150 mmol/L — ABNORMAL HIGH (ref 135–145)
Sodium: 152 mmol/L — ABNORMAL HIGH (ref 135–145)
Sodium: 150 mmol/L — ABNORMAL HIGH (ref 135–145)
Sodium: 150 mmol/L — ABNORMAL HIGH (ref 135–145)
Sodium: 150 mmol/L — ABNORMAL HIGH (ref 135–145)
TCO2: 31 mmol/L (ref 22–32)
TCO2: 30 mmol/L (ref 22–32)
TCO2: 30 mmol/L (ref 22–32)
TCO2: 29 mmol/L (ref 22–32)
TCO2: 27 mmol/L (ref 22–32)
TCO2: 29 mmol/L (ref 22–32)
TCO2: 29 mmol/L (ref 22–32)
TCO2: 29 mmol/L (ref 22–32)
pCO2 arterial: 39.4 mmHg (ref 32.0–48.0)
pCO2 arterial: 40.9 mmHg (ref 32.0–48.0)
pCO2 arterial: 40.8 mmHg (ref 32.0–48.0)
pCO2 arterial: 41.7 mmHg (ref 32.0–48.0)
pCO2 arterial: 39.4 mmHg (ref 32.0–48.0)
pCO2 arterial: 37.8 mmHg (ref 32.0–48.0)
pCO2 arterial: 37.5 mmHg (ref 32.0–48.0)
pCO2 arterial: 38.6 mmHg (ref 32.0–48.0)
pH, Arterial: 7.481 — ABNORMAL HIGH (ref 7.350–7.450)
pH, Arterial: 7.446 (ref 7.350–7.450)
pH, Arterial: 7.449 (ref 7.350–7.450)
pH, Arterial: 7.431 (ref 7.350–7.450)
pH, Arterial: 7.425 (ref 7.350–7.450)
pH, Arterial: 7.47 — ABNORMAL HIGH (ref 7.350–7.450)
pH, Arterial: 7.477 — ABNORMAL HIGH (ref 7.350–7.450)
pH, Arterial: 7.457 — ABNORMAL HIGH (ref 7.350–7.450)
pO2, Arterial: 49 mmHg — ABNORMAL LOW (ref 83.0–108.0)
pO2, Arterial: 59 mmHg — ABNORMAL LOW (ref 83.0–108.0)
pO2, Arterial: 48 mmHg — ABNORMAL LOW (ref 83.0–108.0)
pO2, Arterial: 51 mmHg — ABNORMAL LOW (ref 83.0–108.0)
pO2, Arterial: 46 mmHg — ABNORMAL LOW (ref 83.0–108.0)
pO2, Arterial: 59 mmHg — ABNORMAL LOW (ref 83.0–108.0)
pO2, Arterial: 73 mmHg — ABNORMAL LOW (ref 83.0–108.0)
pO2, Arterial: 72 mmHg — ABNORMAL LOW (ref 83.0–108.0)

## 2020-01-18 LAB — FIBRINOGEN: Fibrinogen: 232 mg/dL (ref 210–475)

## 2020-01-18 LAB — TYPE AND SCREEN
ABO/RH(D): O POS
Antibody Screen: NEGATIVE
Unit division: 0
Unit division: 0
Unit division: 0
Unit division: 0
Unit division: 0

## 2020-01-18 LAB — HEPATIC FUNCTION PANEL
ALT: 54 U/L — ABNORMAL HIGH (ref 0–44)
AST: 50 U/L — ABNORMAL HIGH (ref 15–41)
Albumin: 3.1 g/dL — ABNORMAL LOW (ref 3.5–5.0)
Alkaline Phosphatase: 342 U/L — ABNORMAL HIGH (ref 38–126)
Bilirubin, Direct: 0.3 mg/dL — ABNORMAL HIGH (ref 0.0–0.2)
Indirect Bilirubin: 1 mg/dL — ABNORMAL HIGH (ref 0.3–0.9)
Total Bilirubin: 1.3 mg/dL — ABNORMAL HIGH (ref 0.3–1.2)
Total Protein: 4.7 g/dL — ABNORMAL LOW (ref 6.5–8.1)

## 2020-01-18 LAB — BASIC METABOLIC PANEL
Anion gap: 11 (ref 5–15)
Anion gap: 10 (ref 5–15)
BUN: 102 mg/dL — ABNORMAL HIGH (ref 6–20)
BUN: 108 mg/dL — ABNORMAL HIGH (ref 6–20)
CO2: 28 mmol/L (ref 22–32)
CO2: 26 mmol/L (ref 22–32)
Calcium: 8.7 mg/dL — ABNORMAL LOW (ref 8.9–10.3)
Calcium: 8.3 mg/dL — ABNORMAL LOW (ref 8.9–10.3)
Chloride: 114 mmol/L — ABNORMAL HIGH (ref 98–111)
Chloride: 113 mmol/L — ABNORMAL HIGH (ref 98–111)
Creatinine, Ser: 1.23 mg/dL — ABNORMAL HIGH (ref 0.44–1.00)
Creatinine, Ser: 1.19 mg/dL — ABNORMAL HIGH (ref 0.44–1.00)
GFR calc Af Amer: 57 mL/min — ABNORMAL LOW (ref >60)
GFR calc Af Amer: 60 mL/min — ABNORMAL LOW (ref >60)
GFR calc non Af Amer: 49 mL/min — ABNORMAL LOW (ref >60)
GFR calc non Af Amer: 51 mL/min — ABNORMAL LOW (ref >60)
Glucose, Bld: 259 mg/dL — ABNORMAL HIGH (ref 70–99)
Glucose, Bld: 336 mg/dL — ABNORMAL HIGH (ref 70–99)
Potassium: 3.8 mmol/L (ref 3.5–5.1)
Potassium: 5.4 mmol/L — ABNORMAL HIGH (ref 3.5–5.1)
Sodium: 153 mmol/L — ABNORMAL HIGH (ref 135–145)
Sodium: 149 mmol/L — ABNORMAL HIGH (ref 135–145)

## 2020-01-18 LAB — LACTIC ACID, PLASMA
Lactic Acid, Venous: 3.3 mmol/L (ref 0.5–1.9)
Lactic Acid, Venous: 3.4 mmol/L (ref 0.5–1.9)
Lactic Acid, Venous: 3.1 mmol/L (ref 0.5–1.9)

## 2020-01-18 LAB — APTT
aPTT: 51 seconds — ABNORMAL HIGH (ref 24–36)
aPTT: 63 seconds — ABNORMAL HIGH (ref 24–36)

## 2020-01-18 LAB — LACTATE DEHYDROGENASE: LDH: 623 U/L — ABNORMAL HIGH (ref 98–192)

## 2020-01-18 LAB — PROTIME-INR
INR: 2 — ABNORMAL HIGH (ref 0.8–1.2)
Prothrombin Time: 22.3 seconds — ABNORMAL HIGH (ref 11.4–15.2)

## 2020-01-18 LAB — MAGNESIUM: Magnesium: 2.7 mg/dL — ABNORMAL HIGH (ref 1.7–2.4)

## 2020-01-18 LAB — GLUCOSE, CAPILLARY
Glucose-Capillary: 239 mg/dL — ABNORMAL HIGH (ref 70–99)
Glucose-Capillary: 365 mg/dL — ABNORMAL HIGH (ref 70–99)
Glucose-Capillary: 295 mg/dL — ABNORMAL HIGH (ref 70–99)
Glucose-Capillary: 294 mg/dL — ABNORMAL HIGH (ref 70–99)
Glucose-Capillary: 287 mg/dL — ABNORMAL HIGH (ref 70–99)

## 2020-01-18 MED ORDER — INSULIN ASPART 100 UNIT/ML ~~LOC~~ SOLN
11.0000 [IU] | SUBCUTANEOUS | Status: DC
  Administered 2020-01-18 – 2020-01-19 (×6): 11 [IU] via SUBCUTANEOUS

## 2020-01-18 MED ORDER — FREE WATER
300.0000 mL | Status: DC
  Administered 2020-01-18 – 2020-01-20 (×25): 300 mL

## 2020-01-18 MED ORDER — INSULIN DETEMIR 100 UNIT/ML ~~LOC~~ SOLN
80.0000 [IU] | Freq: Two times a day (BID) | SUBCUTANEOUS | Status: DC
  Administered 2020-01-18 (×2): 80 [IU] via SUBCUTANEOUS
  Filled 2020-01-18 (×4): qty 0.8

## 2020-01-18 MED ORDER — VANCOMYCIN HCL 10 G IV SOLR
2500.0000 mg | Freq: Once | INTRAVENOUS | Status: AC
  Administered 2020-01-18: 2500 mg via INTRAVENOUS
  Filled 2020-01-18: qty 2500

## 2020-01-18 MED ORDER — SODIUM CHLORIDE 0.9 % IV SOLN
2.0000 g | Freq: Three times a day (TID) | INTRAVENOUS | Status: DC
  Administered 2020-01-18 – 2020-01-19 (×3): 2 g via INTRAVENOUS
  Filled 2020-01-18 (×5): qty 2

## 2020-01-18 MED ORDER — VANCOMYCIN HCL 1250 MG/250ML IV SOLN
1250.0000 mg | Freq: Two times a day (BID) | INTRAVENOUS | Status: DC
  Administered 2020-01-19: 1250 mg via INTRAVENOUS
  Filled 2020-01-18 (×2): qty 250

## 2020-01-18 MED ORDER — FUROSEMIDE 10 MG/ML IJ SOLN
8.0000 mg/h | INTRAVENOUS | Status: DC
  Administered 2020-01-18: 5 mg/h via INTRAVENOUS
  Administered 2020-01-19 – 2020-01-21 (×2): 8 mg/h via INTRAVENOUS
  Filled 2020-01-18 (×3): qty 25

## 2020-01-18 NOTE — Progress Notes (Signed)
Inpatient Diabetes Program Recommendations  AACE/ADA: New Consensus Statement on Inpatient Glycemic Control (2015)  Target Ranges:  Prepandial:   less than 140 mg/dL      Peak postprandial:   less than 180 mg/dL (1-2 hours)      Critically ill patients:  140 - 180 mg/dL   Lab Results  Component Value Date   GLUCAP 365 (H) 01/18/2020   HGBA1C 10.7 (H) 01/12/2020    Review of Glycemic Control Results for Alisha Stephenson, Alisha Stephenson (MRN [id number]) as of 01/18/2020 10:47  Ref. Range 01/17/2020 07:56 01/17/2020 12:11 01/17/2020 16:56 01/17/2020 20:04 01/17/2020 23:19 01/18/2020 04:00 01/18/2020 08:36  Glucose-Capillary Latest Ref Range: 70 - 99 mg/dL 253 (H) 291 (H) 317 (H) 340 (H) 313 (H) 239 (H) 365 (H)   Diabetes history: DM 2 Outpatient Diabetes medications: Toujeo 62 units bid, Humalog 15 units tid Current orders for Inpatient glycemic control:  Levemir 80 units bid Novolog 0-20 units Q4 hours Novolog 11 units Q4 hours Tube Feed Coverage  Solumedrol 40 mg Daily  Inpatient Diabetes Program Recommendations:    Due to pt being critically ill  -   Consider using COVID Glycemic control order set start IV insulin. Hyperlgycemia setting on Endotool program.  -  Consider adding Tradjenta 5 mg Daily (on COVID glycemic control order set reduces mortality).  Thanks,  Tama Headings RN, MSN, BC-ADM Inpatient Diabetes Coordinator Team Pager [phone] (8a-5p)

## 2020-01-18 NOTE — Progress Notes (Signed)
Pharmacy Antibiotic Note  Bryna Razavi Banos is a 54 y.o. female admitted on 01/29/2020 with COVID pneumonia now s/p VV ECMO cannulation on 01/20/2020.  Pharmacy has been consulted for vancomycin and meropenem dosing for empiric antibiotic coverage. WBC wnl, currently hypothermic. Lactate increased from 2.1 to 3.3 this morning. CrCl 72 ml/hr. CXR with persistent opacities and slightly improved aeration.  Plan: Give Vancomycin 2500mg  IV once, then 1250mg  IV q12 hrs Start Meropenem 2g IV q8 hrs Monitor renal function, cultures/sensitivities, and clinical progression Check vancomycin trough at steady state   Height: 5\' 4"  (162.6 cm) Weight: (!) 136.9 kg (301 lb 13 oz) IBW/kg (Calculated) : 54.7  Temp (24hrs), Avg:97.5 F (36.4 C), Min:96.3 F (35.7 C), Max:98.8 F (37.1 C)  Recent Labs  Lab 01/16/20 1100 01/16/20 1230 01/16/20 1655 01/16/20 2250 01/16/20 2327 01/17/20 0351 01/17/20 1700 01/17/20 2006 01/18/20 0355 01/18/20 0839 01/18/20 1100  WBC 11.4*   < > 7.2 7.2  --  7.3 7.7  --  9.0  --   --   CREATININE 1.07*  --  1.34*  --   --  1.17* 1.03*  --  1.23*  --   --   LATICACIDVEN  --    < >  --   --    < > 2.8*  --  2.1* 3.3* 3.4* 3.1*   < > = values in this interval not displayed.    Estimated Creatinine Clearance: 71.5 mL/min (A) (by C-G formula based on SCr of 1.23 mg/dL (H)).    Allergies  Allergen Reactions  . Sulfa Antibiotics Rash and Other (See Comments)    Antimicrobials this admission: Remdesivir 8/2 >> 8/6 Azithromycin 8/3 >> 8/6 CTX 8/3 >> 8/6 Vancomycin 8/12 >> Meropenem 8/12 >>  Dose adjustments this admission: N/A  Microbiology results: 8/12 BCx: sent 8/12 TA: GPC 8/4 TA: neg 8/4 UCx: neg 8/2 BCx: neg 8/4 MRSA PCR neg 8/2 COVID pos  Richardine Service, PharmD PGY2 Cardiology Pharmacy Resident Phone: [phone] 01/18/2020  1:36 PM  Please check [email or web address] for unit-specific pharmacy phone numbers.

## 2020-01-18 NOTE — Progress Notes (Signed)
NAME:  Alisha Stephenson, MRN:  [id number], DOB:  07/01/1965, LOS: 25 ADMISSION DATE:  01/20/2020, CONSULTATION DATE:  8/3 REFERRING MD:  Alfredia Ferguson, CHIEF COMPLAINT:  Dyspnea   Brief History   54 y/o female admitted on 8/2 with severe acute respiratory failure with hypoxemia due to COVID 19 pneumonia.  She developed symptoms 1 week prior to admission.  Past Medical History  DM2 Diverticulitis Gallstones Ovarian cyst NAFLD Asthma  Significant Hospital Events   8/2 admit 8/3 ICU transfer, intubated 8/4 prone, paralyze 8/9 significant desaturations today 8/9 VV ECMO cannulation  Consults:  PCCM ECMO team    Procedures:  8/3 ETT >  8/3 PICC >  8/9 RIJ [place] 60F   Significant Diagnostic Tests:  7/31 CT head > NAICP 7/31 MRI/MRA brain > no acute changes, possibly small aneurysm ACOM  Micro Data:  8/2 blood > NG 8/2 SARS COV 2 > positive 8/4 resp > negative 8/4 urine >   Antimicrobials:  8/2 remdesivir > 8/6 8/2 actemra  8/2 solumedrol >   8/3 ceftriaxone >  8/5 8/3 azithro >  8/5  8/12 meropenem> 8/12 vanc>  Interim history/subjective:  Lasix x 1 with good response but chattering in circuit. Increasing vent volumes. Off NMB.  Objective   Blood pressure (!) 103/54, pulse 65, temperature (!) 96.8 F (36 C), resp. rate (!) 29, height 5\' 4"  (1.626 m), weight (!) 136.9 kg, SpO2 95 %. CVP:  [10 mmHg-14 mmHg] 13 mmHg  Vent Mode: PCV FiO2 (%):  [60 %-100 %] 60 % Set Rate:  [10 bmp] 10 bmp PEEP:  [14 cmH20] 14 cmH20 Plateau Pressure:  [16 cmH20-22 cmH20] 17 cmH20   Intake/Output Summary (Last 24 hours) at 01/18/2020 0618 Last data filed at 01/18/2020 0600 Gross per 24 hour  Intake 4427.79 ml  Output 2700 ml  Net 1727.79 ml   Filed Weights   01/16/20 0500 01/17/20 0600 01/18/20 0600  Weight: 133.2 kg 135.3 kg (!) 136.9 kg    Examination:  General: critically ill appearing woman laying in bed intubated, sedated, on ECMO HENT: [place]/AT, eyes anicteric, ETT in  place PULM: improving bilateral breath sounds, rhales. Copious clear secretions from ETT. CV: RRR, no murmurs GI: obese, soft, NT Extremities: edematous, good distal perfusion to A-line Neuro: RASS -5, PERRL  Vent PC 10/ PS 12 + PEEP 14/ 60% with Vt ~240cc P plat~25   Circuit: 3300RPM, 3.8L/min, 8.5L flow  CXR personally reviewed-bilateral opacities, increased aeration most noticeable in left upper lobe.  ET tube, ECMO cannula in place.  Resolved Hospital Problem list     Assessment & Plan:   ARDS due to COVID 19 pneumonia > worsening oxygenation again on 8/6, bilateral effusions, presumed acute pulmonary edema VV ECMO cannulation on 8/9 for refractory hypoxemia; now with persistent hypoxemia despite ECMO, worsened in the past 24 hrs. -con't lung protective ventilation- PEEP 14, PS 12. Ideally titrate down FiO2 to 50% with increase flows -lasix drip for goal net negative volume status; intolerant to PRN dosing 2/2 chugging -off NMB -Continue Solu-Medrol per protocol; previously completed remdesivir per protocol.  Tocilizumab given on 8/2. -decrease sedation as tolerated; goal RASS -1 -VAP prevention protocol -bivalirudin for AC on ECMO  Lactic acidosis of unknown etiology; hypoxia corrected -repeat lactic acid level -if persistently elevated, restart empiric abx- meropenem, vanc -RUQ [place] -panculture  Need for sedation for mechanical ventilation Ventilator dyssynchrony  -wean sedation; off NMB now -Continue fentanyl/versed infusions + orals oxycodone and clonazepam -working towards possible extubation  on ECMO if she is an appropriate candidate  AKI> improved -Continue to monitor daily -Goal of euvolemia-- diuresis -Electrolyte repletion as needed  Hyperglycemia > not controlled despite >200 units insulin past 24 hrs -Goal BG 140-180 while admitted to the ICU -increasing levemir and novolog TF coverage -give long-acting insulin in divided doses to maximize  absorption  Constipation -soap suds enema -increase bowel regimen  Hypernatremia, worsening -free water repletion 300cc Q2h -con't to monitor -avoid NS  Acute anemia due to critical illness Thrombocytopenia due to critical illness- worsening -transfuse for Hb <8 -con't to monitor  Hyperbilirubinemia; stable Hb -RUQ [place]  Best practice:  Diet: tube feeding Pain/Anxiety/Delirium protocol (if indicated): as above VAP protocol (if indicated): yes DVT prophylaxis: bivalirudin GI prophylaxis: famotidine Glucose control: Basal bolus insulin Mobility: bed rest Code Status: full Family Communication: husband [name] updated 8/11 Disposition: ICU  Labs   CBC: Recent Labs  Lab 01/12/20 0213 01/12/20 0213 01/13/20 0500 02/04/2020 0356 01/16/20 1655 01/16/20 2004 01/16/20 2250 01/17/20 0054 01/17/20 0351 01/17/20 0604 01/17/20 1700 01/17/20 2008 01/17/20 2321 01/18/20 0355 01/18/20 0403  WBC 6.2   < > 6.2   < > 7.2  --  7.2  --  7.3  --  7.7  --   --  9.0  --   NEUTROABS 4.9  --  4.9  --   --   --   --   --   --   --   --   --   --   --   --   HGB 12.6   < > 12.8   < > 10.1*   < > 9.3*   < > 10.2*   < > 10.8* 9.9* 10.5* 10.8* 10.5*  HCT 40.0   < > 42.4   < > 35.5*   < > 31.4*   < > 33.3*   < > 35.2* 29.0* 31.0* 35.0* 31.0*  MCV 93.0   < > 94.2   < > 98.3  --  95.7  --  94.6  --  94.1  --   --  92.8  --   PLT 147*   < > 132*   < > 99*  --  95*  --  91*  --  92*  --   --  63*  --    < > = values in this interval not displayed.    Basic Metabolic Panel: Recent Labs  Lab 01/16/20 0343 01/16/20 0558 01/16/20 1100 01/16/20 1159 01/16/20 1655 01/16/20 2004 01/17/20 0351 01/17/20 0604 01/17/20 1700 01/17/20 2008 01/17/20 2321 01/18/20 0355 01/18/20 0403  NA 155*   < > 154*   < > 152*   < > 149*   < > 150* 151* 152* 153* 154*  K 4.3   < > 5.7*   < > 5.2*   < > 4.0   < > 5.0 4.7 4.5 3.8 3.7  CL 112*   < > 113*  --  111  --  111  --  112*  --   --  114*  --   CO2 35*    < > 34*  --  32  --  29  --  30  --   --  28  --   GLUCOSE 168*   < > 181*  --  314*  --  332*  --  337*  --   --  259*  --   BUN 72*   < > 84*  --  94*  --  99*  --  97*  --   --  102*  --   CREATININE 0.74   < > 1.07*  --  1.34*  --  1.17*  --  1.03*  --   --  1.23*  --   CALCIUM 8.4*   < > 8.1*  --  8.1*  --  8.1*  --  8.4*  --   --  8.7*  --   MG 3.1*  --  2.9*  --   --   --   --   --   --   --   --   --   --   PHOS 2.3*  --  4.7*  --   --   --   --   --   --   --   --   --   --    < > = values in this interval not displayed.   GFR: Estimated Creatinine Clearance: 71.5 mL/min (A) (by C-G formula based on SCr of 1.23 mg/dL (H)). Recent Labs  Lab 01/16/20 1230 01/16/20 2250 01/16/20 2327 01/17/20 0351 01/17/20 1700 01/17/20 2006 01/18/20 0355  WBC  --  7.2  --  7.3 7.7  --  9.0  LATICACIDVEN   < >  --  2.4* 2.8*  --  2.1* 3.3*   < > = values in this interval not displayed.    Liver Function Tests: Recent Labs  Lab 01/09/2020 2035 01/16/20 0343 01/16/20 0356 01/17/20 0351 01/18/20 0355  AST 56* 54* 54* 58* 50*  ALT 71* 64* 64* 59* 54*  ALKPHOS 93 81 80 92 342*  BILITOT 0.9 0.4 0.6 1.2 1.3*  PROT 4.5* 4.5* 4.6* 4.5* 4.7*  ALBUMIN 2.3* 2.6* 2.7* 3.0* 3.1*   No results for input(s): LIPASE, AMYLASE in the last 168 hours. No results for input(s): AMMONIA in the last 168 hours.  ABG    Component Value Date/Time   PHART 7.446 01/18/2020 0403   PCO2ART 40.9 01/18/2020 0403   PO2ART 59 (L) 01/18/2020 0403   HCO3 28.3 (H) 01/18/2020 0403   TCO2 30 01/18/2020 0403   ACIDBASEDEF 2.0 01/11/2020 1203   O2SAT 92.0 01/18/2020 0403     Coagulation Profile: Recent Labs  Lab 02/05/2020 2035 01/16/20 0356 01/17/20 0351 01/18/20 0355  INR 1.5* 1.6* 2.1* 2.0*    Cardiac Enzymes: No results for input(s): CKTOTAL, CKMB, CKMBINDEX, TROPONINI in the last 168 hours.  HbA1C: Hgb A1c MFr Bld  Date/Time Value Ref Range Status  01/10/2020 03:56 AM 10.7 (H) 4.8 - 5.6 % Final     Comment:    (NOTE) Pre diabetes:          5.7%-6.4%  Diabetes:              >6.4%  Glycemic control for   <7.0% adults with diabetes   09/04/2019 03:22 PM 9.9 (H) 4.6 - 6.5 % Final    Comment:    Glycemic Control Guidelines for People with Diabetes:Non Diabetic:  <6%Goal of Therapy: <7%Additional Action Suggested:  >8%     CBG: Recent Labs  Lab 01/17/20 1211 01/17/20 1656 01/17/20 2004 01/17/20 2319 01/18/20 0400  GLUCAP 291* 317* 340* 313* 239*     This patient is critically ill with multiple organ system failure which requires frequent high complexity decision making, assessment, support, evaluation, and titration of therapies. This was completed through the application of advanced monitoring technologies and extensive interpretation of multiple databases. During this  encounter critical care time was devoted to patient care services described in this note for 62 minutes.  Julian Hy, DO 01/18/20 8:58 AM [facility]

## 2020-01-18 NOTE — Progress Notes (Signed)
Advanced Heart Failure Rounding Note   Subjective:    Remains intubated/sedated on vent. On VV ECMO.  Off nimbex. Given lasix yesterday x 1 with good response but developed increased chugging. Given albumin. ECMO flows turned down   Lactic acid and LDH up this am without clear cause. No bleeding  Remains unresponsive. TV good on vent. CXR still with diffuse infiltrates   ECMO  Flow 3.8L RPM 3300 Sweep 8.5  Labs:  7.45/41/48/88% Hgb10.8 LDH 608 (pre-cannulation) -> 558 -> 543 -> 501  -> 623 PTT on bival = 51 Lactic acid 2.7 -> 2.5 -> 2.8-> 2.1 -> 3.3    Objective:   Weight Range:  Vital Signs:   Temp:  [96.3 F (35.7 C)-98.2 F (36.8 C)] 97.5 F (36.4 C) (08/12 1433) Pulse Rate:  [64-90] 90 (08/12 1433) Resp:  [0-45] 18 (08/12 1433) BP: (96-130)/(32-54) 114/51 (08/12 1000) SpO2:  [89 %-100 %] 93 % (08/12 1433) Arterial Line BP: (100-183)/(44-74) 106/53 (08/12 1345) FiO2 (%):  [60 %-70 %] 60 % (08/12 1433) Weight:  [136.9 kg] 136.9 kg (08/12 0600) Last BM Date: (S) 01/18/20  Weight change: Filed Weights   01/16/20 0500 01/17/20 0600 01/18/20 0600  Weight: 133.2 kg 135.3 kg (!) 136.9 kg    Intake/Output:   Intake/Output Summary (Last 24 hours) at 01/18/2020 1608 Last data filed at 01/18/2020 1300 Gross per 24 hour  Intake 3825.52 ml  Output 2235 ml  Net 1590.52 ml     Physical Exam: General:  Intubated/sedated  HEENT: normal + ETT Neck: supple. RIJ ECMO cannula  LIJ TLC.  Cor: PMI nondisplaced. Regular rate & rhythm. No rubs, gallops or murmurs. Lungs: coarse Abdomen: obese soft, nontender, nondistended. No hepatosplenomegaly. No bruits or masses. Good bowel sounds. Extremities: no cyanosis, clubbing, rash, 3+ edema Neuro: unresponsive on vent    Telemetry: Sinus 90-100 Personally reviewed    Labs: Basic Metabolic Panel: Recent Labs  Lab 01/16/20 0343 01/16/20 0558 01/16/20 1100 01/16/20 1159 01/16/20 1655 01/16/20 2004  01/17/20 0351 01/17/20 0352 01/17/20 1700 01/17/20 2008 01/18/20 0355 01/18/20 0403 01/18/20 0837 01/18/20 1138 01/18/20 1558  NA 155*   < > 154*   < > 152*   < > 149*   < > 150*   < > 153* 154* 149* 150* 152*  K 4.3   < > 5.7*   < > 5.2*   < > 4.0   < > 5.0   < > 3.8 3.7 5.0 4.7 4.6  CL 112*   < > 113*  --  111  --  111  --  112*  --  114*  --   --   --   --   CO2 35*   < > 34*  --  32  --  29  --  30  --  28  --   --   --   --   GLUCOSE 168*   < > 181*  --  314*  --  332*  --  337*  --  259*  --   --   --   --   BUN 72*   < > 84*  --  94*  --  99*  --  97*  --  102*  --   --   --   --   CREATININE 0.74   < > 1.07*  --  1.34*  --  1.17*  --  1.03*  --  1.23*  --   --   --   --  CALCIUM 8.4*   < > 8.1*   < > 8.1*   < > 8.1*  --  8.4*  --  8.7*  --   --   --   --   MG 3.1*  --  2.9*  --   --   --   --   --   --   --   --   --   --   --   --   PHOS 2.3*  --  4.7*  --   --   --   --   --   --   --   --   --   --   --   --    < > = values in this interval not displayed.    Liver Function Tests: Recent Labs  Lab 01/26/2020 2035 01/16/20 0343 01/16/20 0356 01/17/20 0351 01/18/20 0355  AST 56* 54* 54* 58* 50*  ALT 71* 64* 64* 59* 54*  ALKPHOS 93 81 80 92 342*  BILITOT 0.9 0.4 0.6 1.2 1.3*  PROT 4.5* 4.5* 4.6* 4.5* 4.7*  ALBUMIN 2.3* 2.6* 2.7* 3.0* 3.1*   No results for input(s): LIPASE, AMYLASE in the last 168 hours. No results for input(s): AMMONIA in the last 168 hours.  CBC: Recent Labs  Lab 01/12/20 0213 01/12/20 0213 01/13/20 0500 01/27/2020 0356 01/16/20 1655 01/16/20 2004 01/16/20 2250 01/17/20 0054 01/17/20 0351 01/17/20 0352 01/17/20 1700 01/17/20 2008 01/18/20 0355 01/18/20 0403 01/18/20 0837 01/18/20 1138 01/18/20 1558  WBC 6.2   < > 6.2   < > 7.2  --  7.2  --  7.3  --  7.7  --  9.0  --   --   --   --   NEUTROABS 4.9  --  4.9  --   --   --   --   --   --   --   --   --   --   --   --   --   --   HGB 12.6   < > 12.8   < > 10.1*   < > 9.3*   < > 10.2*   <  > 10.8*   < > 10.8* 10.5* 10.9* 10.9* 11.6*  HCT 40.0   < > 42.4   < > 35.5*   < > 31.4*   < > 33.3*   < > 35.2*   < > 35.0* 31.0* 32.0* 32.0* 34.0*  MCV 93.0   < > 94.2   < > 98.3  --  95.7  --  94.6  --  94.1  --  92.8  --   --   --   --   PLT 147*   < > 132*   < > 99*  --  95*  --  91*  --  92*  --  63*  --   --   --   --    < > = values in this interval not displayed.    Cardiac Enzymes: No results for input(s): CKTOTAL, CKMB, CKMBINDEX, TROPONINI in the last 168 hours.  BNP: BNP (last 3 results) No results for input(s): BNP in the last 8760 hours.  ProBNP (last 3 results) No results for input(s): PROBNP in the last 8760 hours.    Other results:  Imaging: DG CHEST PORT 1 VIEW  Result Date: 01/18/2020 CLINICAL DATA:  COVID-19 positivity, ECMO EXAM: PORTABLE CHEST 1 VIEW COMPARISON:  01/17/2020 FINDINGS: Endotracheal tube, feeding  catheter, left jugular central line and right-sided PICC line are again identified and stable. Large ECMO cannula is noted in the right neck. Cardiac shadow is stable. Slight improved aeration is noted in both lungs although persistent opacities remain consistent with the given clinical history. No bony abnormality is noted. IMPRESSION: Slight improved aeration bilaterally. Tubes and lines as described. Electronically Signed   By: Inez Catalina M.D.   On: 01/18/2020 08:18   DG CHEST PORT 1 VIEW  Result Date: 01/17/2020 CLINICAL DATA:  COVID positive on ECMO EXAM: PORTABLE CHEST 1 VIEW COMPARISON:  Earlier same day FINDINGS: Endotracheal tube, left IJ central line, right PICC line, and enteric tube are again identified. ECMO cannula is also again seen. Persistent diffuse opacification of bilateral lungs. Cardiomediastinal contours are obscured. IMPRESSION: Lines and tubes as above. Similar appearance of diffuse bilateral pulmonary opacification. Electronically Signed   By: Macy Mis M.D.   On: 01/17/2020 09:34   DG CHEST PORT 1 VIEW  Result Date:  01/17/2020 CLINICAL DATA:  COVID-19 positivity EXAM: PORTABLE CHEST 1 VIEW COMPARISON:  01/16/2020 FINDINGS: Diffuse airspace opacity is identified stable in appearance from the prior exam. Feeding catheter, endotracheal tube, left jugular central line and ECMO cannula are again noted and stable. Right-sided PICC line is noted in satisfactory position. IMPRESSION: Tubes and lines as described stable in appearance. Diffuse opacification of lungs bilaterally with minimal aeration in the central aspect of the right lung. Electronically Signed   By: Inez Catalina M.D.   On: 01/17/2020 08:25   DG CHEST PORT 1 VIEW  Result Date: 01/16/2020 CLINICAL DATA:  Cardio respiratory failure EXAM: PORTABLE CHEST 1 VIEW COMPARISON:  01/16/2020 FINDINGS: Support devices including ECMO are stable. Near complete opacification of both lungs, worsening since prior study. Difficult to visualize the heart. IMPRESSION: Worsening aeration with near complete opacification of both lungs. Electronically Signed   By: Rolm Baptise M.D.   On: 01/16/2020 22:51   DG CHEST PORT 1 VIEW  Result Date: 01/16/2020 CLINICAL DATA:  ECMO EXAM: PORTABLE CHEST 1 VIEW COMPARISON:  01/16/2020 FINDINGS: Support devices are stable. Diffuse airspace disease throughout the lungs, stable or slightly worsening since prior study. Suspect layering effusions. Heart is mildly enlarged. No acute bony abnormality. IMPRESSION: Severe diffuse bilateral airspace disease, stable or slightly worsening. Suspect layering effusions. Electronically Signed   By: Rolm Baptise M.D.   On: 01/16/2020 18:19     Medications:     Scheduled Medications: . artificial tears  1 application Both Eyes M3T  . vitamin C  500 mg Per Tube Daily  . chlorhexidine gluconate (MEDLINE KIT)  15 mL Mouth Rinse BID  . Chlorhexidine Gluconate Cloth  6 each Topical Daily  . cisatracurium  10 mg Intravenous Once  . clonazePAM  1 mg Per Tube BID  . docusate  100 mg Per Tube BID  . feeding  supplement (PROSource TF)  45 mL Per Tube Daily  . fentaNYL (SUBLIMAZE) injection  50 mcg Intravenous Once  . free water  300 mL Per Tube Q2H  . insulin aspart  0-20 Units Subcutaneous Q4H  . insulin aspart  11 Units Subcutaneous Q4H  . insulin detemir  80 Units Subcutaneous BID  . mouth rinse  15 mL Mouth Rinse 10 times per day  . methylPREDNISolone (SOLU-MEDROL) injection  40 mg Intravenous Daily  . nutrition supplement (JUVEN)  1 packet Per Tube BID BM  . oxyCODONE  5 mg Per Tube Q6H  . pantoprazole sodium  40  mg Per Tube Daily  . polyethylene glycol  17 g Per Tube Daily  . sennosides  5 mL Per Tube BID  . sodium chloride flush  10-40 mL Intracatheter Q12H  . zinc sulfate  220 mg Per Tube Daily    Infusions: . sodium chloride Stopped (01/28/2020 2352)  . sodium chloride Stopped (01/10/2020 1009)  . albumin human Stopped (01/17/20 2258)  . bivalirudin (ANGIOMAX) infusion 0.5 mg/mL (Non-ACS indications) 0.075 mg/kg/hr (01/18/20 1300)  . cisatracurium (NIMBEX) infusion Stopped (01/17/20 1227)  . dexmedetomidine (PRECEDEX) IV infusion 0.4 mcg/kg/hr (01/18/20 1300)  . dextrose 5 % and 0.45% NaCl Stopped (01/12/2020 0827)  . feeding supplement (PIVOT 1.5 CAL) 60 mL/hr at 01/18/20 0200  . fentaNYL infusion INTRAVENOUS 125 mcg/hr (01/18/20 1300)  . furosemide (LASIX) infusion 8 mg/hr (01/18/20 1437)  . meropenem (MERREM) IV 2 g (01/18/20 1509)  . midazolam Stopped (01/17/20 2102)  . norepinephrine (LEVOPHED) Adult infusion Stopped (01/18/20 1251)  . vancomycin 2,500 mg (01/18/20 1409)  . [START ON 01/19/2020] vancomycin      PRN Medications: sodium chloride, acetaminophen (TYLENOL) oral liquid 160 mg/5 mL, albumin human, albuterol, fentaNYL, fentaNYL, guaiFENesin-dextromethorphan, hydrALAZINE, hydrOXYzine, midazolam, ondansetron **OR** ondansetron (ZOFRAN) IV, sodium chloride flush   Assessment/Plan:   1. Acute hypoxic/hypercapneic respiratory failure in setting of severe COVID PNA/ARDS  -> VV ECMO - admit 8/2 - intubation 8/3 - has received actmera (compelted 8/2), remdesivir (completed 8/6) and steroids - failed full vent support with proning/paralytic - Cannulated for VV ECMO on 8/9 - Given lasix overnight with good response but developed chugging. Albumin given and ECMO speed turned down. Remains markedly volume overloaded. Will try slow diuresis with lasix gtt - Continue lung rest strategy - Discussion had regarding early extubation vs need for trach. Will try to wean sedation to see if extubation possible but I suspect she will need trach on Monday.  - Oxygenation remains marginal. ABG otherwise ok  - Continue bival  2. Morbid obesity - Body mass index is 49.38 kg/m.  3. Poorly controlled DM2 - HgBA1c 10.7 - CBGs remain elevated - Adjust insulin as needed  4. Hypernatremia - continue free water boluses  5. Lactic acidosis - unclear etiology. No clear hypoxia to explain - ? Infection or early DKA - start broad spectrum abx. Control hyperglycemia.  - Follow   CRITICAL CARE Performed by: Glori Bickers  Total critical care time: 45 minutes  Critical care time was exclusive of separately billable procedures and treating other patients.  Critical care was necessary to treat or prevent imminent or life-threatening deterioration.  Critical care was time spent personally by me (independent of midlevel providers or residents) on the following activities: development of treatment plan with patient and/or surrogate as well as nursing, discussions with consultants, evaluation of patient's response to treatment, examination of patient, obtaining history from patient or surrogate, ordering and performing treatments and interventions, ordering and review of laboratory studies, ordering and review of radiographic studies, pulse oximetry and re-evaluation of patient's condition.   Length of Stay: 10   Glori Bickers  MD 01/18/2020, 4:08 PM  Advanced Heart  Failure Team Pager [phone] (M-F; 7a - 4p)  Please contact [facility] for night-coverage after hours (4p -7a ) and weekends on [email or web address]

## 2020-01-18 NOTE — Progress Notes (Signed)
[facility] for bivalirudin Indication: VV ECMO  Allergies  Allergen Reactions  . Sulfa Antibiotics Rash and Other (See Comments)    Patient Measurements: Height: 5\' 4"  (162.6 cm) Weight: (!) 136.9 kg (301 lb 13 oz) IBW/kg (Calculated) : 54.7 Heparin Dosing Weight: 87kg  Vital Signs: Temp: 96.8 F (36 C) (08/12 0700) BP: 105/42 (08/12 0700) Pulse Rate: 71 (08/12 0700)  Labs: Recent Labs    01/18/2020 2023 01/30/2020 2035 01/16/20 0356 01/16/20 0558 01/17/20 0351 01/17/20 0604 01/17/20 1224 01/17/20 1658 01/17/20 1700 01/17/20 2008 01/17/20 2321 01/17/20 2321 01/18/20 0355 01/18/20 0403  HGB  --    < >  --    < > 10.2*   < >  --    < > 10.8*   < > 10.5*   < > 10.8* 10.5*  HCT  --    < >  --    < > 33.3*   < >  --    < > 35.2*   < > 31.0*  --  35.0* 31.0*  PLT  --    < >  --    < > 91*  --   --   --  92*  --   --   --  63*  --   APTT  --    < > 51*   < > 96*   < > 59*  --  52*  --   --   --  51*  --   LABPROT  --    < > 18.4*  --  23.2*  --   --   --   --   --   --   --  22.3*  --   INR  --    < > 1.6*  --  2.1*  --   --   --   --   --   --   --  2.0*  --   HEPARINUNFRC 1.06*  --   --   --   --   --   --   --   --   --   --   --   --   --   CREATININE  --    < >  --    < > 1.17*  --   --   --  1.03*  --   --   --  1.23*  --    < > = values in this interval not displayed.    Estimated Creatinine Clearance: 71.5 mL/min (A) (by C-G formula based on SCr of 1.23 mg/dL (H)).   Medical History: Past Medical History:  Diagnosis Date  . Asthma   . Diabetes mellitus without complication ([place])   . Diverticulitis   . Gallstones   . IBS (irritable bowel syndrome)   . NAFLD (nonalcoholic fatty liver disease)    CT scan 2015  . Ovarian cyst     Assessment: 55 year old female admitted with COVID s/p treatment with antivirals completed 8/6. Now with continued low saturations and decision was made to transfer to [facility] for ECMO cannulation  and continue with VV ECMO.   APTT decreasing but on low end of therapeutic at 51 on bival 0.07 mg/kg/hr. Hgb 10.8, plt down from 90s to 63. Fibrinogen 232 stable. LDH up 623. No s/sx of bleeding or infusion issues. Will increase drip rate slightly to maintain therapeutic level.    Goal of Therapy:  aPTT  50-80 seconds Monitor platelets by anticoagulation protocol: Yes   Plan:  Increase bivalirudin to 0.075 mg/kg/hr Check aPTT at 0500/1700 Monitor daily aPTT, CBC, s/sx bleeding  Richardine Service, PharmD PGY2 Cardiology Pharmacy Resident Phone: [phone] 01/18/2020  12:11 PM  Please check [email or web address] for unit-specific pharmacy phone numbers.

## 2020-01-18 NOTE — Progress Notes (Signed)
Assisted tele visit to patient with family member.  Miral Hoopes P, RN  

## 2020-01-18 NOTE — CV Procedure (Signed)
ECMO NOTE:  Indication: Respiratory failure due to COVID PNA  Initial cannulation date: 01/14/2020  ECMO type: VV ECMO (Centrimag with oximizer)  Dual lumen Inflow/return cannula:  1) 32 FR RIJ [place] placed 01/30/2020  ECMO events:  - Initial cannulation 01/25/2020 - Cannula repositioned (pulled back ) 01/17/20   Daily data:  Flow 3.8L RPM 3300 Sweep 8.5  Labs:  7.45/41/48/88% Hgb10.8 LDH 608 (pre-cannulation) -> 558 -> 543 -> 501  -> 623 PTT on bival = 51 Lactic acid 2.7 -> 2.5 -> 2.8-> 2.1 -> 3.3  Plan:  Continue ECMO support. Now off paralytic.  Given lasix yesterday x 1 with good response but developed increased chugging. Given albumin Lactic acid and LDH up this am without clear cause.  Will culture and start broad spectrum abx Markedly volume overload -> start lasix gtt Aggressively manage hyperglycemia Wean sedation  Discussed in multidisciplinary fashion on ECMO rounds with TCTS, CCM, ECMO coordinator/specialist, RT, PharmD and nursing staff all present.   Glori Bickers, MD  4:04 PM

## 2020-01-18 NOTE — Progress Notes (Signed)
[place] for bivalirudin Indication: VV ECMO  Allergies  Allergen Reactions  . Sulfa Antibiotics Rash and Other (See Comments)    Patient Measurements: Height: 5\' 4"  (162.6 cm) Weight: (!) 136.9 kg (301 lb 13 oz) IBW/kg (Calculated) : 54.7 Heparin Dosing Weight: 87kg  Vital Signs: Temp: 98.6 F (37 C) (08/12 1715) BP: 114/51 (08/12 1000) Pulse Rate: 79 (08/12 1715)  Labs: Recent Labs    02/03/2020 2023 01/07/2020 2035 01/16/20 0356 01/16/20 0558 01/17/20 0351 01/17/20 0352 01/17/20 1700 01/17/20 2008 01/18/20 0355 01/18/20 0403 01/18/20 1138 01/18/20 1138 01/18/20 1558 01/18/20 1700  HGB  --    < >  --    < > 10.2*   < > 10.8*   < > 10.8*   < > 10.9*   < > 11.6* 10.9*  HCT  --    < >  --    < > 33.3*   < > 35.2*   < > 35.0*   < > 32.0*  --  34.0* 35.5*  PLT  --    < >  --    < > 91*   < > 92*  --  63*  --   --   --   --  37*  APTT  --    < > 51*   < > 96*   < > 52*  --  51*  --   --   --   --  63*  LABPROT  --    < > 18.4*  --  23.2*  --   --   --  22.3*  --   --   --   --   --   INR  --    < > 1.6*  --  2.1*  --   --   --  2.0*  --   --   --   --   --   HEPARINUNFRC 1.06*  --   --   --   --   --   --   --   --   --   --   --   --   --   CREATININE  --    < >  --    < > 1.17*  --  1.03*  --  1.23*  --   --   --   --   --    < > = values in this interval not displayed.    Estimated Creatinine Clearance: 71.5 mL/min (A) (by C-G formula based on SCr of 1.23 mg/dL (H)).   Medical History: Past Medical History:  Diagnosis Date  . Asthma   . Diabetes mellitus without complication ([place])   . Diverticulitis   . Gallstones   . IBS (irritable bowel syndrome)   . NAFLD (nonalcoholic fatty liver disease)    CT scan 2015  . Ovarian cyst     Assessment: 54 year old female admitted with COVID s/p treatment with antivirals completed 8/6. Now with continued low saturations and decision was made to transfer to [facility] for ECMO cannulation  and continue with VV ECMO.   APTT up this evening to 63s on bival 0.075 mg/kg/hr. Hgb stable 10.9, plt down from 90s>63>37. No s/sx of bleeding or infusion issues. Continue at current rate for now.    Goal of Therapy:  aPTT 50-80 seconds Monitor platelets by anticoagulation protocol: Yes   Plan:  Continue bivalirudin at 0.075 mg/kg/hr Check aPTT  at 0500/1700 Monitor daily aPTT, CBC, s/sx bleeding  Erin Hearing PharmD., BCPS Clinical Pharmacist 01/18/2020 6:14 PM  Please check [email or web address] for unit-specific pharmacy phone numbers.

## 2020-01-19 ENCOUNTER — Inpatient Hospital Stay (HOSPITAL_COMMUNITY): Payer: PRIVATE HEALTH INSURANCE

## 2020-01-19 DIAGNOSIS — E119 Type 2 diabetes mellitus without complications: Secondary | ICD-10-CM

## 2020-01-19 DIAGNOSIS — R601 Generalized edema: Secondary | ICD-10-CM | POA: Diagnosis not present

## 2020-01-19 DIAGNOSIS — K7200 Acute and subacute hepatic failure without coma: Secondary | ICD-10-CM

## 2020-01-19 DIAGNOSIS — B952 Enterococcus as the cause of diseases classified elsewhere: Secondary | ICD-10-CM

## 2020-01-19 DIAGNOSIS — I619 Nontraumatic intracerebral hemorrhage, unspecified: Secondary | ICD-10-CM | POA: Insufficient documentation

## 2020-01-19 DIAGNOSIS — Z9281 Personal history of extracorporeal membrane oxygenation (ECMO): Secondary | ICD-10-CM

## 2020-01-19 DIAGNOSIS — E669 Obesity, unspecified: Secondary | ICD-10-CM

## 2020-01-19 DIAGNOSIS — N19 Unspecified kidney failure: Secondary | ICD-10-CM

## 2020-01-19 DIAGNOSIS — R7401 Elevation of levels of liver transaminase levels: Secondary | ICD-10-CM

## 2020-01-19 DIAGNOSIS — R7881 Bacteremia: Secondary | ICD-10-CM

## 2020-01-19 DIAGNOSIS — N179 Acute kidney failure, unspecified: Secondary | ICD-10-CM | POA: Diagnosis not present

## 2020-01-19 DIAGNOSIS — Z01818 Encounter for other preprocedural examination: Secondary | ICD-10-CM

## 2020-01-19 DIAGNOSIS — R6521 Severe sepsis with septic shock: Secondary | ICD-10-CM

## 2020-01-19 DIAGNOSIS — R739 Hyperglycemia, unspecified: Secondary | ICD-10-CM

## 2020-01-19 DIAGNOSIS — Z9911 Dependence on respirator [ventilator] status: Secondary | ICD-10-CM | POA: Diagnosis not present

## 2020-01-19 DIAGNOSIS — E870 Hyperosmolality and hypernatremia: Secondary | ICD-10-CM

## 2020-01-19 DIAGNOSIS — J80 Acute respiratory distress syndrome: Secondary | ICD-10-CM

## 2020-01-19 DIAGNOSIS — J1282 Pneumonia due to Coronavirus disease 2019: Secondary | ICD-10-CM | POA: Diagnosis not present

## 2020-01-19 DIAGNOSIS — I611 Nontraumatic intracerebral hemorrhage in hemisphere, cortical: Secondary | ICD-10-CM

## 2020-01-19 DIAGNOSIS — A419 Sepsis, unspecified organism: Secondary | ICD-10-CM

## 2020-01-19 DIAGNOSIS — U071 COVID-19: Secondary | ICD-10-CM | POA: Diagnosis not present

## 2020-01-19 DIAGNOSIS — D696 Thrombocytopenia, unspecified: Secondary | ICD-10-CM

## 2020-01-19 DIAGNOSIS — R092 Respiratory arrest: Secondary | ICD-10-CM

## 2020-01-19 DIAGNOSIS — J9600 Acute respiratory failure, unspecified whether with hypoxia or hypercapnia: Secondary | ICD-10-CM | POA: Diagnosis not present

## 2020-01-19 DIAGNOSIS — E1165 Type 2 diabetes mellitus with hyperglycemia: Secondary | ICD-10-CM

## 2020-01-19 LAB — GLUCOSE, CAPILLARY
Glucose-Capillary: 327 mg/dL — ABNORMAL HIGH (ref 70–99)
Glucose-Capillary: 397 mg/dL — ABNORMAL HIGH (ref 70–99)
Glucose-Capillary: 363 mg/dL — ABNORMAL HIGH (ref 70–99)
Glucose-Capillary: 372 mg/dL — ABNORMAL HIGH (ref 70–99)
Glucose-Capillary: 396 mg/dL — ABNORMAL HIGH (ref 70–99)
Glucose-Capillary: 359 mg/dL — ABNORMAL HIGH (ref 70–99)
Glucose-Capillary: 287 mg/dL — ABNORMAL HIGH (ref 70–99)
Glucose-Capillary: 239 mg/dL — ABNORMAL HIGH (ref 70–99)
Glucose-Capillary: 213 mg/dL — ABNORMAL HIGH (ref 70–99)
Glucose-Capillary: 226 mg/dL — ABNORMAL HIGH (ref 70–99)
Glucose-Capillary: 242 mg/dL — ABNORMAL HIGH (ref 70–99)

## 2020-01-19 LAB — CBC
HCT: 34.9 % — ABNORMAL LOW (ref 36.0–46.0)
HCT: 33.2 % — ABNORMAL LOW (ref 36.0–46.0)
HCT: 31.7 % — ABNORMAL LOW (ref 36.0–46.0)
Hemoglobin: 10.8 g/dL — ABNORMAL LOW (ref 12.0–15.0)
Hemoglobin: 10.3 g/dL — ABNORMAL LOW (ref 12.0–15.0)
Hemoglobin: 9.8 g/dL — ABNORMAL LOW (ref 12.0–15.0)
MCH: 29 pg (ref 26.0–34.0)
MCH: 29.2 pg (ref 26.0–34.0)
MCH: 28.7 pg (ref 26.0–34.0)
MCHC: 30.9 g/dL (ref 30.0–36.0)
MCHC: 31 g/dL (ref 30.0–36.0)
MCHC: 30.9 g/dL (ref 30.0–36.0)
MCV: 93.8 fL (ref 80.0–100.0)
MCV: 94.1 fL (ref 80.0–100.0)
MCV: 92.7 fL (ref 80.0–100.0)
Platelets: 26 10*3/uL — CL (ref 150–400)
Platelets: 27 10*3/uL — CL (ref 150–400)
Platelets: 24 10*3/uL — CL (ref 150–400)
RBC: 3.72 MIL/uL — ABNORMAL LOW (ref 3.87–5.11)
RBC: 3.53 MIL/uL — ABNORMAL LOW (ref 3.87–5.11)
RBC: 3.42 MIL/uL — ABNORMAL LOW (ref 3.87–5.11)
RDW: 15.9 % — ABNORMAL HIGH (ref 11.5–15.5)
RDW: 15.9 % — ABNORMAL HIGH (ref 11.5–15.5)
RDW: 15.9 % — ABNORMAL HIGH (ref 11.5–15.5)
WBC: 13.6 10*3/uL — ABNORMAL HIGH (ref 4.0–10.5)
WBC: 10.8 10*3/uL — ABNORMAL HIGH (ref 4.0–10.5)
WBC: 8.4 10*3/uL (ref 4.0–10.5)
nRBC: 0.5 % — ABNORMAL HIGH (ref 0.0–0.2)
nRBC: 0.5 % — ABNORMAL HIGH (ref 0.0–0.2)
nRBC: 0.2 % (ref 0.0–0.2)

## 2020-01-19 LAB — BASIC METABOLIC PANEL
Anion gap: 12 (ref 5–15)
Anion gap: 10 (ref 5–15)
Anion gap: 11 (ref 5–15)
BUN: 106 mg/dL — ABNORMAL HIGH (ref 6–20)
BUN: 105 mg/dL — ABNORMAL HIGH (ref 6–20)
BUN: 99 mg/dL — ABNORMAL HIGH (ref 6–20)
CO2: 26 mmol/L (ref 22–32)
CO2: 27 mmol/L (ref 22–32)
CO2: 26 mmol/L (ref 22–32)
Calcium: 8 mg/dL — ABNORMAL LOW (ref 8.9–10.3)
Calcium: 8 mg/dL — ABNORMAL LOW (ref 8.9–10.3)
Calcium: 7.8 mg/dL — ABNORMAL LOW (ref 8.9–10.3)
Chloride: 110 mmol/L (ref 98–111)
Chloride: 112 mmol/L — ABNORMAL HIGH (ref 98–111)
Chloride: 109 mmol/L (ref 98–111)
Creatinine, Ser: 1.22 mg/dL — ABNORMAL HIGH (ref 0.44–1.00)
Creatinine, Ser: 1.25 mg/dL — ABNORMAL HIGH (ref 0.44–1.00)
Creatinine, Ser: 1.12 mg/dL — ABNORMAL HIGH (ref 0.44–1.00)
GFR calc Af Amer: 58 mL/min — ABNORMAL LOW (ref >60)
GFR calc Af Amer: 56 mL/min — ABNORMAL LOW (ref >60)
GFR calc Af Amer: >60 mL/min (ref >60)
GFR calc non Af Amer: 50 mL/min — ABNORMAL LOW (ref >60)
GFR calc non Af Amer: 48 mL/min — ABNORMAL LOW (ref >60)
GFR calc non Af Amer: 55 mL/min — ABNORMAL LOW (ref >60)
Glucose, Bld: 418 mg/dL — ABNORMAL HIGH (ref 70–99)
Glucose, Bld: 402 mg/dL — ABNORMAL HIGH (ref 70–99)
Glucose, Bld: 241 mg/dL — ABNORMAL HIGH (ref 70–99)
Potassium: 4.5 mmol/L (ref 3.5–5.1)
Potassium: 4.4 mmol/L (ref 3.5–5.1)
Potassium: 4.1 mmol/L (ref 3.5–5.1)
Sodium: 148 mmol/L — ABNORMAL HIGH (ref 135–145)
Sodium: 149 mmol/L — ABNORMAL HIGH (ref 135–145)
Sodium: 146 mmol/L — ABNORMAL HIGH (ref 135–145)

## 2020-01-19 LAB — POCT I-STAT 7, (LYTES, BLD GAS, ICA,H+H)
Acid-Base Excess: 2 mmol/L (ref 0.0–2.0)
Acid-Base Excess: 4 mmol/L — ABNORMAL HIGH (ref 0.0–2.0)
Acid-Base Excess: 5 mmol/L — ABNORMAL HIGH (ref 0.0–2.0)
Acid-Base Excess: 2 mmol/L (ref 0.0–2.0)
Acid-Base Excess: 3 mmol/L — ABNORMAL HIGH (ref 0.0–2.0)
Bicarbonate: 27.2 mmol/L (ref 20.0–28.0)
Bicarbonate: 28.7 mmol/L — ABNORMAL HIGH (ref 20.0–28.0)
Bicarbonate: 29.5 mmol/L — ABNORMAL HIGH (ref 20.0–28.0)
Bicarbonate: 26.7 mmol/L (ref 20.0–28.0)
Bicarbonate: 28.2 mmol/L — ABNORMAL HIGH (ref 20.0–28.0)
Calcium, Ion: 1.17 mmol/L (ref 1.15–1.40)
Calcium, Ion: 1.13 mmol/L — ABNORMAL LOW (ref 1.15–1.40)
Calcium, Ion: 1.15 mmol/L (ref 1.15–1.40)
Calcium, Ion: 1.19 mmol/L (ref 1.15–1.40)
Calcium, Ion: 1.13 mmol/L — ABNORMAL LOW (ref 1.15–1.40)
HCT: 30 % — ABNORMAL LOW (ref 36.0–46.0)
HCT: 29 % — ABNORMAL LOW (ref 36.0–46.0)
HCT: 30 % — ABNORMAL LOW (ref 36.0–46.0)
HCT: 28 % — ABNORMAL LOW (ref 36.0–46.0)
HCT: 30 % — ABNORMAL LOW (ref 36.0–46.0)
Hemoglobin: 10.2 g/dL — ABNORMAL LOW (ref 12.0–15.0)
Hemoglobin: 10.2 g/dL — ABNORMAL LOW (ref 12.0–15.0)
Hemoglobin: 9.9 g/dL — ABNORMAL LOW (ref 12.0–15.0)
Hemoglobin: 9.5 g/dL — ABNORMAL LOW (ref 12.0–15.0)
Hemoglobin: 10.2 g/dL — ABNORMAL LOW (ref 12.0–15.0)
O2 Saturation: 88 %
O2 Saturation: 100 %
O2 Saturation: 88 %
O2 Saturation: 95 %
O2 Saturation: 92 %
Patient temperature: 36.6
Patient temperature: 36.7
Patient temperature: 35.5
Patient temperature: 36.7
Potassium: 4.2 mmol/L (ref 3.5–5.1)
Potassium: 4.2 mmol/L (ref 3.5–5.1)
Potassium: 4.2 mmol/L (ref 3.5–5.1)
Potassium: 4.1 mmol/L (ref 3.5–5.1)
Potassium: 4.5 mmol/L (ref 3.5–5.1)
Sodium: 149 mmol/L — ABNORMAL HIGH (ref 135–145)
Sodium: 148 mmol/L — ABNORMAL HIGH (ref 135–145)
Sodium: 148 mmol/L — ABNORMAL HIGH (ref 135–145)
Sodium: 147 mmol/L — ABNORMAL HIGH (ref 135–145)
Sodium: 147 mmol/L — ABNORMAL HIGH (ref 135–145)
TCO2: 28 mmol/L (ref 22–32)
TCO2: 30 mmol/L (ref 22–32)
TCO2: 31 mmol/L (ref 22–32)
TCO2: 28 mmol/L (ref 22–32)
TCO2: 29 mmol/L (ref 22–32)
pCO2 arterial: 42.6 mmHg (ref 32.0–48.0)
pCO2 arterial: 42.9 mmHg (ref 32.0–48.0)
pCO2 arterial: 43.5 mmHg (ref 32.0–48.0)
pCO2 arterial: 40.2 mmHg (ref 32.0–48.0)
pCO2 arterial: 42 mmHg (ref 32.0–48.0)
pH, Arterial: 7.413 (ref 7.350–7.450)
pH, Arterial: 7.431 (ref 7.350–7.450)
pH, Arterial: 7.438 (ref 7.350–7.450)
pH, Arterial: 7.425 (ref 7.350–7.450)
pH, Arterial: 7.434 (ref 7.350–7.450)
pO2, Arterial: 54 mmHg — ABNORMAL LOW (ref 83.0–108.0)
pO2, Arterial: 442 mmHg — ABNORMAL HIGH (ref 83.0–108.0)
pO2, Arterial: 53 mmHg — ABNORMAL LOW (ref 83.0–108.0)
pO2, Arterial: 69 mmHg — ABNORMAL LOW (ref 83.0–108.0)
pO2, Arterial: 61 mmHg — ABNORMAL LOW (ref 83.0–108.0)

## 2020-01-19 LAB — FIBRINOGEN: Fibrinogen: 236 mg/dL (ref 210–475)

## 2020-01-19 LAB — BLOOD CULTURE ID PANEL (REFLEXED) - BCID2

## 2020-01-19 LAB — HEPATIC FUNCTION PANEL
ALT: 108 U/L — ABNORMAL HIGH (ref 0–44)
AST: 147 U/L — ABNORMAL HIGH (ref 15–41)
Albumin: 2.7 g/dL — ABNORMAL LOW (ref 3.5–5.0)
Alkaline Phosphatase: 214 U/L — ABNORMAL HIGH (ref 38–126)
Bilirubin, Direct: 0.5 mg/dL — ABNORMAL HIGH (ref 0.0–0.2)
Indirect Bilirubin: 1.5 mg/dL — ABNORMAL HIGH (ref 0.3–0.9)
Total Bilirubin: 2 mg/dL — ABNORMAL HIGH (ref 0.3–1.2)
Total Protein: 4.4 g/dL — ABNORMAL LOW (ref 6.5–8.1)

## 2020-01-19 LAB — LACTATE DEHYDROGENASE
LDH: 1110 U/L — ABNORMAL HIGH (ref 98–192)
LDH: 1103 U/L — ABNORMAL HIGH (ref 98–192)

## 2020-01-19 LAB — PROTIME-INR
INR: 2.2 — ABNORMAL HIGH (ref 0.8–1.2)
Prothrombin Time: 23.9 seconds — ABNORMAL HIGH (ref 11.4–15.2)

## 2020-01-19 LAB — SAVE SMEAR(SSMR), FOR PROVIDER SLIDE REVIEW

## 2020-01-19 LAB — TECHNOLOGIST SMEAR REVIEW
Plt Morphology: DECREASED
WBC Morphology: INCREASED

## 2020-01-19 LAB — LACTIC ACID, PLASMA: Lactic Acid, Venous: 2.1 mmol/L (ref 0.5–1.9)

## 2020-01-19 LAB — APTT: aPTT: 52 seconds — ABNORMAL HIGH (ref 24–36)

## 2020-01-19 MED ORDER — SODIUM BICARBONATE 8.4 % IV SOLN
INTRAVENOUS | Status: AC
  Filled 2020-01-19: qty 50

## 2020-01-19 MED ORDER — IOHEXOL 9 MG/ML PO SOLN
500.0000 mL | ORAL | Status: AC
  Administered 2020-01-19 (×2): 500 mL via ORAL

## 2020-01-19 MED ORDER — PIVOT 1.5 CAL PO LIQD
1000.0000 mL | ORAL | Status: DC
  Administered 2020-01-19 – 2020-02-02 (×18): 1000 mL
  Filled 2020-01-19 (×28): qty 1000

## 2020-01-19 MED ORDER — PHENYLEPHRINE 40 MCG/ML (10ML) SYRINGE FOR IV PUSH (FOR BLOOD PRESSURE SUPPORT)
PREFILLED_SYRINGE | INTRAVENOUS | Status: AC
  Filled 2020-01-19: qty 10

## 2020-01-19 MED ORDER — ALBUMIN HUMAN 5 % IV SOLN
INTRAVENOUS | Status: AC
  Filled 2020-01-19: qty 250

## 2020-01-19 MED ORDER — INSULIN ASPART 100 UNIT/ML ~~LOC~~ SOLN
20.0000 [IU] | SUBCUTANEOUS | Status: DC
  Administered 2020-01-19: 20 [IU] via SUBCUTANEOUS

## 2020-01-19 MED ORDER — PROSOURCE TF PO LIQD
45.0000 mL | Freq: Two times a day (BID) | ORAL | Status: DC
  Administered 2020-01-19 – 2020-01-30 (×19): 45 mL
  Filled 2020-01-19 (×22): qty 45

## 2020-01-19 MED ORDER — CALCIUM CHLORIDE 10 % IV SOLN
INTRAVENOUS | Status: AC
  Filled 2020-01-19: qty 10

## 2020-01-19 MED ORDER — EPINEPHRINE 1 MG/10ML IJ SOSY
PREFILLED_SYRINGE | INTRAMUSCULAR | Status: AC
  Filled 2020-01-19: qty 20

## 2020-01-19 MED ORDER — IOHEXOL 350 MG/ML SOLN
100.0000 mL | Freq: Once | INTRAVENOUS | Status: AC | PRN
  Administered 2020-01-19: 100 mL via INTRAVENOUS

## 2020-01-19 MED ORDER — DEXTROSE 50 % IV SOLN
0.0000 mL | INTRAVENOUS | Status: DC | PRN
  Administered 2020-02-03: 45 mL via INTRAVENOUS
  Filled 2020-01-19: qty 50

## 2020-01-19 MED ORDER — ALBUMIN HUMAN 25 % IV SOLN
12.5000 g | Freq: Once | INTRAVENOUS | Status: AC
  Administered 2020-01-19: 12.5 g via INTRAVENOUS

## 2020-01-19 MED ORDER — PIPERACILLIN-TAZOBACTAM 4.5 G IVPB
4.5000 g | Freq: Four times a day (QID) | INTRAVENOUS | Status: DC
  Administered 2020-01-19 – 2020-01-23 (×16): 4.5 g via INTRAVENOUS
  Filled 2020-01-19 (×22): qty 100

## 2020-01-19 MED ORDER — INSULIN REGULAR(HUMAN) IN NACL 100-0.9 UT/100ML-% IV SOLN
INTRAVENOUS | Status: AC
  Administered 2020-01-19: 17 [IU]/h via INTRAVENOUS
  Administered 2020-01-19: 1.2 [IU]/h via INTRAVENOUS
  Administered 2020-01-20: 0.5 [IU]/h via INTRAVENOUS
  Administered 2020-01-20: 11 [IU]/h via INTRAVENOUS
  Administered 2020-01-21: 11.5 [IU]/h via INTRAVENOUS
  Administered 2020-01-21: 3.5 [IU]/h via INTRAVENOUS
  Administered 2020-01-23: 9 [IU]/h via INTRAVENOUS
  Administered 2020-01-23: 6 [IU]/h via INTRAVENOUS
  Administered 2020-01-23: 16 [IU]/h via INTRAVENOUS
  Administered 2020-01-24 (×3): 13 [IU]/h via INTRAVENOUS
  Administered 2020-01-25: 24 [IU]/h via INTRAVENOUS
  Administered 2020-01-25 – 2020-01-26 (×2): 17 [IU]/h via INTRAVENOUS
  Administered 2020-01-26: 25 [IU]/h via INTRAVENOUS
  Administered 2020-01-26: 20 [IU]/h via INTRAVENOUS
  Administered 2020-01-26: 21 [IU]/h via INTRAVENOUS
  Administered 2020-01-27: 11.5 [IU]/h via INTRAVENOUS
  Administered 2020-01-27: 17 [IU]/h via INTRAVENOUS
  Administered 2020-01-28: 15 [IU]/h via INTRAVENOUS
  Administered 2020-01-28: 8 [IU]/h via INTRAVENOUS
  Administered 2020-01-28: 22 [IU]/h via INTRAVENOUS
  Administered 2020-01-29: 5 [IU]/h via INTRAVENOUS
  Administered 2020-01-29: 13 [IU]/h via INTRAVENOUS
  Administered 2020-01-30: 12 [IU]/h via INTRAVENOUS
  Administered 2020-01-30: 17 [IU]/h via INTRAVENOUS
  Administered 2020-01-30: 6.5 [IU]/h via INTRAVENOUS
  Administered 2020-01-31: 11 [IU]/h via INTRAVENOUS
  Administered 2020-01-31: 18 [IU]/h via INTRAVENOUS
  Administered 2020-01-31: 10.5 [IU]/h via INTRAVENOUS
  Administered 2020-02-01: 16 [IU]/h via INTRAVENOUS
  Administered 2020-02-01: 18 [IU]/h via INTRAVENOUS
  Administered 2020-02-01: 17 [IU]/h via INTRAVENOUS
  Administered 2020-02-02: 20 [IU]/h via INTRAVENOUS
  Administered 2020-02-02 (×2): 18 [IU]/h via INTRAVENOUS
  Administered 2020-02-02 – 2020-02-03 (×2): 14 [IU]/h via INTRAVENOUS
  Administered 2020-02-03: 9.5 [IU]/h via INTRAVENOUS
  Administered 2020-02-03: 23 [IU]/h via INTRAVENOUS
  Administered 2020-02-04: 7.5 [IU]/h via INTRAVENOUS
  Administered 2020-02-04: 8 [IU]/h via INTRAVENOUS
  Administered 2020-02-05: 10.5 [IU]/h via INTRAVENOUS
  Administered 2020-02-06 – 2020-02-07 (×2): 4.8 [IU]/h via INTRAVENOUS
  Administered 2020-02-07: 12 [IU]/h via INTRAVENOUS
  Administered 2020-02-08: 16 [IU]/h via INTRAVENOUS
  Administered 2020-02-09: 7.5 [IU]/h via INTRAVENOUS
  Administered 2020-02-10: 3.2 [IU]/h via INTRAVENOUS
  Administered 2020-02-11: 0.8 [IU]/h via INTRAVENOUS
  Administered 2020-02-14: 1.5 [IU]/h via INTRAVENOUS
  Administered 2020-02-15: 1.7 [IU]/h via INTRAVENOUS
  Administered 2020-02-17: 0.3 [IU]/h via INTRAVENOUS
  Administered 2020-02-17: 0.6 [IU]/h via INTRAVENOUS
  Administered 2020-02-19 – 2020-02-20 (×2): 7 [IU]/h via INTRAVENOUS
  Administered 2020-02-21: 5.5 [IU]/h via INTRAVENOUS
  Administered 2020-02-21: 8 [IU]/h via INTRAVENOUS
  Filled 2020-01-19 (×50): qty 100
  Filled 2020-01-19: qty 200
  Filled 2020-01-19 (×3): qty 100
  Filled 2020-01-19: qty 300
  Filled 2020-01-19 (×5): qty 100

## 2020-01-19 MED ORDER — VANCOMYCIN HCL 1250 MG/250ML IV SOLN
1250.0000 mg | Freq: Two times a day (BID) | INTRAVENOUS | Status: DC
  Administered 2020-01-19 – 2020-01-20 (×3): 1250 mg via INTRAVENOUS
  Filled 2020-01-19 (×4): qty 250

## 2020-01-19 MED ORDER — LINAGLIPTIN 5 MG PO TABS
5.0000 mg | ORAL_TABLET | Freq: Every day | ORAL | Status: DC
  Administered 2020-01-19 – 2020-03-04 (×46): 5 mg
  Filled 2020-01-19 (×47): qty 1

## 2020-01-19 NOTE — Consult Note (Signed)
Stroke Neurology Consultation Note  Consult Requested by: Dr. Haroldine Laws  Reason for Consult: [place]  Consult Date: 01/19/20   The history was obtained from the medical chart and RN.  During history and examination, all items were able to obtain unless otherwise noted.  History of Present Illness:  Alisha Stephenson is a 54 y.o. Caucasian female with PMH of diabetes and morbid obesity admitted for Covid pneumonia.   Patient and her husband was positive for Covid at the end of July.  Initially wheeze cough and diarrhea, but gradually getting worse.  Came to ED on 7/31 for severe headache, MRI/MRA negative for acute finding.  8/2 admitted for short of breath and Covid pneumonia.  She was intubated 8/3, however condition deteriorated was put on ECMO 8/9.  During the hospitalization, patient developed multiorgan involvement including uremia with BUN 105, AKI, shock liver, thrombocytopenia platelet today 27, bacteremia with Enterococcus sepsis on Zosyn and Vanco, hyperglycemia due to uncontrolled diabetes on insulin drip, hypernatremia sodium today 147 on free water, hypotension needed pressors.  She is still on ventilation with Precedex and fentanyl.  She was continued on Angiomax anticoagulation for ECMO.  As per RN, patient has encephalopathy for the last several days, yesterday was still able to stick out tongue as request, however today more encephalopathic, not able to follow any commands.  Pan CT ordered.  CT chest showed Covid ARDS with right more than left pleural effusion.  However, CT head showed multifocal ICH with largest within the right frontoparietal area.  Angiomax discontinued.  Neurology was consulted for further evaluation.  LSN: Unclear tPA Given: No: ICH  Past Medical History:  Diagnosis Date  . Asthma   . Diabetes mellitus without complication ([place])   . Diverticulitis   . Gallstones   . IBS (irritable bowel syndrome)   . NAFLD (nonalcoholic fatty liver disease)    CT scan 2015   . Ovarian cyst     Past Surgical History:  Procedure Laterality Date  . ABDOMINAL HYSTERECTOMY    . CESAREAN SECTION    . CHOLECYSTECTOMY    . COLONOSCOPY     about 2011  . ECMO CANNULATION N/A 01/10/2020   Procedure: ECMO CANNULATION;  Surgeon: Jolaine Artist, MD;  Location: [facility];  Service: Cardiovascular;  Laterality: N/A;    Family History  Problem Relation Age of Onset  . Diabetes Father   . Heart disease Father   . Breast cancer Maternal Grandmother   . Breast cancer Maternal Aunt   . Colon cancer Neg Hx   . Esophageal cancer Neg Hx     Social History:  reports that she has quit smoking. She has never used smokeless tobacco. She reports that she does not drink alcohol and does not use drugs.  Allergies:  Allergies  Allergen Reactions  . Sulfa Antibiotics Rash and Other (See Comments)    No current facility-administered medications on file prior to encounter.   Current Outpatient Medications on File Prior to Encounter  Medication Sig Dispense Refill  . albuterol (PROVENTIL HFA;VENTOLIN HFA) 108 (90 Base) MCG/ACT inhaler Inhale 2 puffs into the lungs every 6 (six) hours as needed for wheezing or shortness of breath. 1 Inhaler 2  . azithromycin (ZITHROMAX) 250 MG tablet Take 2 tabs the first day and then 1 tab daily until you run out. 6 tablet 0  . benzonatate (TESSALON) 200 MG capsule Take 200 mg by mouth 3 (three) times daily.    . Blood Glucose Monitoring  Suppl (ONETOUCH VERIO) w/Device KIT Test as directed once weekly to check blood sugar. 1 kit 0  . dicyclomine (BENTYL) 10 MG capsule Take 1 capsule (10 mg total) by mouth 4 (four) times daily -  before meals and at bedtime. 120 capsule 6  . doxycycline (VIBRA-TABS) 100 MG tablet Take 100 mg by mouth 2 (two) times daily. 10 day supply    . fluticasone (FLONASE) 50 MCG/ACT nasal spray PLACE 2 SPRAYS INTO BOTH NOSTRILS DAILY. 16 g 1  . glucose blood (ONETOUCH VERIO) test strip USE STRIP TO CHECK  GLUCOSE THREE TIMES DAILY 300 each 3  . ibuprofen (ADVIL) 600 MG tablet Take 1 tablet (600 mg total) by mouth every 8 (eight) hours as needed. 30 tablet 0  . insulin lispro (HUMALOG) 100 UNIT/ML KwikPen 15 units with each meal + 8 g/1 unit sliding scale. 15 mL 11  . meloxicam (MOBIC) 7.5 MG tablet Take 1 tablet (7.5 mg total) by mouth daily. 14 tablet 0  . ONE TOUCH LANCETS MISC Use once a week to check blood sugar. 1001 each 1  . tiZANidine (ZANAFLEX) 2 MG tablet Take 1 tablet (2 mg total) by mouth every 8 (eight) hours as needed for muscle spasms. 15 tablet 0  . TOUJEO MAX SOLOSTAR 300 UNIT/ML Solostar Pen INJECT 62 UNITS INTO THE SKIN 2 (TWO) TIMES DAILY. (Patient taking differently: Inject 62 Units into the skin 2 (two) times daily. ) 12 mL 3    Review of Systems: A full ROS was attempted today and was not able to be performed.   Physical Examination: Temp:  [95.9 F (35.5 C)-99.1 F (37.3 C)] 95.9 F (35.5 C) (08/13 1500) Pulse Rate:  [50-99] 55 (08/13 1500) Resp:  [0-34] 14 (08/13 1500) BP: (96-129)/(57) 96/57 (08/13 1500) SpO2:  [84 %-100 %] 96 % (08/13 1500) Arterial Line BP: (91-151)/(45-69) 109/53 (08/13 1500) FiO2 (%):  [60 %] 60 % (08/13 1429) Weight:  [138.4 kg] 138.4 kg (08/13 0500)  General - Well nourished, well developed, intubated on fentanyl and Precedex.  Ophthalmologic - fundi not visualized due to noncooperation.  Cardiovascular - Regular rhythm but bradycardia.  Neuro - intubated on fentanyl and Precedex, eyes closed, not following commands. With forced eye opening, eyes in mid position, not blinking to visual threat, doll's eyes absent, not tracking, PERRL. Corneal reflex subtle response bilaterally, gag and cough not tested due to hypotension and bradycardia. Breathing not over the vent.  Facial symmetry not able to test due to ET tube.  Tongue protrusion not cooperative. On pain stimulation, no movement of all extremities. DTR diminished and no babinski.  Sensation, coordination and gait not tested.   Data Reviewed: DG Chest 1 View  Result Date: 01/13/2020 CLINICAL DATA:  Endotracheal tube position EXAM: CHEST  1 VIEW COMPARISON:  01/13/2020 FINDINGS: Endotracheal tube tip is at the level of the clavicular heads, unchanged. There is improved lucency of the lungs following patient rotation. IMPRESSION: Endotracheal tube tip at the level of the clavicular heads. Increased lucency of the lungs following patient rotation. Electronically Signed   By: Ulyses Jarred M.D.   On: 01/13/2020 06:09   DG Abd 1 View  Result Date: 01/16/2020 CLINICAL DATA:  54 year old female enteric tube placement. EXAM: ABDOMEN - 1 VIEW COMPARISON:  01/13/2020 and earlier. FINDINGS: Portable AP prone view at 0031 hours. Enteric feeding tube tip is at the level of the distal gastric body, projecting in the midline. Paucity of bowel gas. Stable cholecystectomy clips. No acute osseous  abnormality identified. IMPRESSION: Enteric feeding tube tip at the level of the distal gastric body. Paucity of bowel gas. Electronically Signed   By: Genevie Ann M.D.   On: 01/29/2020 02:31   DG Abd 1 View  Result Date: 01/13/2020 CLINICAL DATA:  Nasogastric tube placement EXAM: ABDOMEN - 1 VIEW COMPARISON:  01/10/2020 FINDINGS: Tip of the weighted enteric tube projects within the left mid abdomen, likely in the stomach. IMPRESSION: Enteric tube tip in the stomach. Electronically Signed   By: Ulyses Jarred M.D.   On: 01/13/2020 06:07   DG Abd 1 View  Result Date: 01/10/2020 CLINICAL DATA:  Feeding tube placement. EXAM: ABDOMEN - 1 VIEW COMPARISON:  January 09, 2020. FINDINGS: The bowel gas pattern is normal. Distal tip of feeding tube is seen in expected position of distal stomach. No radio-opaque calculi or other significant radiographic abnormality are seen. IMPRESSION: Distal tip of feeding tube seen in expected position of distal stomach. Electronically Signed   By: Marijo Conception M.D.   On: 01/10/2020  14:38   DG Abd 1 View  Result Date: 01/09/2020 CLINICAL DATA:  Endogastric tube placement. EXAM: ABDOMEN - 1 VIEW COMPARISON:  None. FINDINGS: A nasogastric tube is seen with its distal tip overlying the expected region of the duodenal bulb. The bowel gas pattern is normal. No radio-opaque calculi or other significant radiographic abnormality are seen. Radiopaque surgical clips are seen overlying the right upper quadrant. IMPRESSION: Nasogastric tube positioning as described above. Electronically Signed   By: Virgina Norfolk M.D.   On: 01/09/2020 17:07   CT HEAD WO CONTRAST  Result Date: 01/19/2020 CLINICAL DATA:  Mental status change, COVID positive, on ECMO EXAM: CT HEAD WITHOUT CONTRAST TECHNIQUE: Contiguous axial images were obtained from the base of the skull through the vertex without intravenous contrast. COMPARISON:  01/06/2020 FINDINGS: Brain: Multifocal areas of parenchymal hemorrhage identified bilaterally with greatest involvement of the parietal lobes. The largest area the right parietal lobe measures approximately 3.8 x 2.3 cm. There is edema associated with these hemorrhages causing mild regional mass effect. No intraventricular extension. No hydrocephalus. Gray-white differentiation is preserved. Vascular: No hyperdense vessel or unexpected calcification. Skull: Calvarium is unremarkable. Sinuses/Orbits: Nonspecific extensive paranasal sinus opacification. Other: Nonspecific mastoid and middle ear opacification. Partially imaged endotracheal and enteric tubes. IMPRESSION: Multifocal parenchymal hemorrhages, with the largest within the right parietal lobe. Together with associated edema, there is mild regional mass effect but no herniation. Considerations include sequelae of ECMO anticoagulation, hemorrhagic conversion of embolic infarcts, and vasculitis/vasculopathy. These results were called by telephone at the time of interpretation on 01/19/2020 at 2:41 pm to provider Noemi Chapel , who  verbally acknowledged these results. Electronically Signed   By: Macy Mis M.D.   On: 01/19/2020 14:45   CT Head Wo Contrast  Result Date: 01/06/2020 CLINICAL DATA:  Headache EXAM: CT HEAD WITHOUT CONTRAST TECHNIQUE: Contiguous axial images were obtained from the base of the skull through the vertex without intravenous contrast. COMPARISON:  None. FINDINGS: Brain: No evidence of acute infarction, hemorrhage, hydrocephalus, extra-axial collection or mass lesion/mass effect. Vascular: No hyperdense vessel or unexpected calcification. Skull: Normal. Negative for fracture or focal lesion. Sinuses/Orbits: Normal globes and orbits. Small area polypoid mucosal thickening in the anterior inferior right maxillary sinus. Sinuses otherwise clear. Other: None. IMPRESSION: No intracranial abnormalities. Electronically Signed   By: Lajean Manes M.D.   On: 01/06/2020 09:45   MR ANGIO HEAD WO CONTRAST  Result Date: 01/06/2020 CLINICAL DATA:  Subarachnoid hemorrhage  suspected. Additional provided: Subarachnoid hemorrhage suspected, COVID positive with headaches for 9 days. EXAM: MRI HEAD WITHOUT CONTRAST MRA HEAD WITHOUT CONTRAST TECHNIQUE: Multiplanar, multiecho pulse sequences of the brain and surrounding structures were obtained without intravenous contrast. Angiographic images of the head were obtained using MRA technique without contrast. COMPARISON:  Head CT 01/06/2020 FINDINGS: MRI HEAD FINDINGS Brain: Cerebral volume is normal. Minimal scattered T2/FLAIR hyperintensity within the cerebral white matter is nonspecific, but consistent with chronic small vessel ischemic disease. There is no acute infarct. No evidence of intracranial mass. No chronic intracranial blood products. No extra-axial fluid collection. No midline shift. Vascular: Reported below. Skull and upper cervical spine: No focal marrow lesion. Sinuses/Orbits: Visualized orbits show no acute finding. Mild ethmoid sinus mucosal thickening. Small right  maxillary sinus mucous retention cyst. No significant mastoid effusion MRA HEAD FINDINGS The intracranial internal carotid arteries are patent. The M1 middle cerebral arteries are patent without significant stenosis. No M2 proximal branch occlusion or high-grade proximal stenosis is identified. 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 The A1 left ACA and more distal anterior cerebral arteries are patent. There is a slightly bulbous appearance of the anterior communicating artery measuring approximately 1 mm (series 103, image 6). This may reflect a tiny aneurysm. The non dominant intracranial left vertebral artery is developmentally diminutive, but patent. The dominant cranial right vertebral artery is patent without significant stenosis. The basilar artery is developmentally diminutive, but patent without significant stenosis. The posterior cerebral arteries are patent proximally without significant stenosis. The posterior cerebral arteries are predominantly fetal in origin bilaterally. IMPRESSION: MRI brain: 1. No evidence of acute intracranial abnormality. 2. Mild chronic small vessel ischemic changes within the cerebral white matter MRA head: 1. No intracranial large vessel occlusion or proximal high-grade arterial stenosis. 2. 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 3. There is a slightly bulbous appearance of the anterior communicating artery measuring 1 mm in diameter. This may reflect a tiny aneurysm. 4. The intracranial vertebral and basilar arteries are developmentally diminutive in the setting of predominantly fetal origin posterior cerebral arteries. Electronically Signed   By: Kellie Simmering DO   On: 01/06/2020 19:14   MR Brain Wo Contrast (neuro protocol)  Result Date: 01/06/2020 CLINICAL DATA:  Subarachnoid hemorrhage suspected. Additional provided: Subarachnoid hemorrhage suspected, COVID positive with headaches for 9 days. EXAM: MRI HEAD WITHOUT CONTRAST MRA HEAD  WITHOUT CONTRAST TECHNIQUE: Multiplanar, multiecho pulse sequences of the brain and surrounding structures were obtained without intravenous contrast. Angiographic images of the head were obtained using MRA technique without contrast. COMPARISON:  Head CT 01/06/2020 FINDINGS: MRI HEAD FINDINGS Brain: Cerebral volume is normal. Minimal scattered T2/FLAIR hyperintensity within the cerebral white matter is nonspecific, but consistent with chronic small vessel ischemic disease. There is no acute infarct. No evidence of intracranial mass. No chronic intracranial blood products. No extra-axial fluid collection. No midline shift. Vascular: Reported below. Skull and upper cervical spine: No focal marrow lesion. Sinuses/Orbits: Visualized orbits show no acute finding. Mild ethmoid sinus mucosal thickening. Small right maxillary sinus mucous retention cyst. No significant mastoid effusion MRA HEAD FINDINGS The intracranial internal carotid arteries are patent. The M1 middle cerebral arteries are patent without significant stenosis. No M2 proximal branch occlusion or high-grade proximal stenosis is identified. 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 The A1 left ACA and more distal anterior cerebral arteries are patent. There is a slightly bulbous appearance of the anterior communicating artery measuring approximately 1 mm (series 103, image 6). This may reflect  a tiny aneurysm. The non dominant intracranial left vertebral artery is developmentally diminutive, but patent. The dominant cranial right vertebral artery is patent without significant stenosis. The basilar artery is developmentally diminutive, but patent without significant stenosis. The posterior cerebral arteries are patent proximally without significant stenosis. The posterior cerebral arteries are predominantly fetal in origin bilaterally. IMPRESSION: MRI brain: 1. No evidence of acute intracranial abnormality. 2. Mild chronic small vessel  ischemic changes within the cerebral white matter MRA head: 1. No intracranial large vessel occlusion or proximal high-grade arterial stenosis. 2. 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 3. There is a slightly bulbous appearance of the anterior communicating artery measuring 1 mm in diameter. This may reflect a tiny aneurysm. 4. The intracranial vertebral and basilar arteries are developmentally diminutive in the setting of predominantly fetal origin posterior cerebral arteries. Electronically Signed   By: Kellie Simmering DO   On: 01/06/2020 19:14   CARDIAC CATHETERIZATION  Result Date: 01/16/2020 See surgical note for result.  CT CHEST ABDOMEN PELVIS W CONTRAST  Result Date: 01/19/2020 CLINICAL DATA:  Sepsis. Leukocytosis. Respiratory failure. COVID ARDS. EXAM: CT CHEST, ABDOMEN, AND PELVIS WITH CONTRAST TECHNIQUE: Multidetector CT imaging of the chest, abdomen and pelvis was performed following the standard protocol during bolus administration of intravenous contrast. CONTRAST:  114m OMNIPAQUE IOHEXOL 350 MG/ML SOLN COMPARISON:  Abdomen and pelvis CT dated 05/10/2014, portable chest obtained earlier today. FINDINGS: CT CHEST FINDINGS Cardiovascular: The right jugular ECMO catheter extends through the superior vena cava and into the inferior vena cava with its tip above the level of the renal veins. Mildly enlarged heart. Minimal pericardial fluid with a maximum thickness of 5 mm. Mediastinum/Nodes: Endotracheal tube tip 1 cm above the carina. Feeding tube extending into the stomach. Unremarkable included thyroid gland. No enlarged lymph nodes. Lungs/Pleura: Dense airspace opacity throughout both lungs with air bronchograms. Small to moderate-sized right pleural effusion and small left pleural effusion. Musculoskeletal: Thoracic spine degenerative changes. Lower cervical spine fixation hardware. CT ABDOMEN PELVIS FINDINGS Hepatobiliary: No focal liver abnormality is seen. Status post  cholecystectomy. No biliary dilatation. Pancreas: Unremarkable. No pancreatic ductal dilatation or surrounding inflammatory changes. Spleen: Normal in size without focal abnormality. Adrenals/Urinary Tract: Foley catheter in the urinary bladder with no urine in the bladder. There is associated air in the bladder. Normal appearing adrenal glands, kidneys and ureters. Stomach/Bowel: Large number of sigmoid and distal descending colon diverticula without evidence of diverticulitis. Normal appearing appendix, small bowel and stomach. Feeding tube tip in the mid to distal stomach. Rectal balloon catheter. Vascular/Lymphatic: No significant vascular findings are present. No enlarged abdominal or pelvic lymph nodes. Reproductive: Status post hysterectomy. No adnexal masses. Other: Mild bilateral subcutaneous edema. Small amount of free peritoneal fluid. Musculoskeletal: Lumbar spine degenerative changes. IMPRESSION: 1. Dense airspace opacity throughout both lungs with air bronchograms, compatible with the clinical diagnosis of COVID ARDS. Dense bilateral pneumonia could also have this appearance. 2. Small to moderate-sized right pleural effusion and small left pleural effusion. 3. Small amount of ascites. 4. Colonic diverticulosis. Electronically Signed   By: SClaudie ReveringM.D.   On: 01/19/2020 15:23   DG CHEST PORT 1 VIEW  Result Date: 01/19/2020 CLINICAL DATA:  Hypoxia EXAM: PORTABLE CHEST 1 VIEW COMPARISON:  January 18, 2020 FINDINGS: Endotracheal tube tip is 2.8 cm above the carina. Feeding tube tip is below the diaphragm. Central catheter tips are in the superior vena cava. ECMO catheter extends below the diaphragm on the right. No pneumothorax. There is widespread  airspace opacity bilaterally with underlying pleural effusions. Heart is prominent, stable. Pulmonary vascularity largely obscured by overlying airspace opacity. Postoperative change noted in the lower cervical region. IMPRESSION: Persistent widespread  airspace opacity with underlying pleural effusions. Tube and catheter positions as described without pneumothorax. Stable cardiac silhouette. Electronically Signed   By: Lowella Grip III M.D.   On: 01/19/2020 08:20   DG CHEST PORT 1 VIEW  Result Date: 01/18/2020 CLINICAL DATA:  COVID-19 positivity, ECMO EXAM: PORTABLE CHEST 1 VIEW COMPARISON:  01/17/2020 FINDINGS: Endotracheal tube, feeding catheter, left jugular central line and right-sided PICC line are again identified and stable. Large ECMO cannula is noted in the right neck. Cardiac shadow is stable. Slight improved aeration is noted in both lungs although persistent opacities remain consistent with the given clinical history. No bony abnormality is noted. IMPRESSION: Slight improved aeration bilaterally. Tubes and lines as described. Electronically Signed   By: Inez Catalina M.D.   On: 01/18/2020 08:18   DG CHEST PORT 1 VIEW  Result Date: 01/17/2020 CLINICAL DATA:  COVID positive on ECMO EXAM: PORTABLE CHEST 1 VIEW COMPARISON:  Earlier same day FINDINGS: Endotracheal tube, left IJ central line, right PICC line, and enteric tube are again identified. ECMO cannula is also again seen. Persistent diffuse opacification of bilateral lungs. Cardiomediastinal contours are obscured. IMPRESSION: Lines and tubes as above. Similar appearance of diffuse bilateral pulmonary opacification. Electronically Signed   By: Macy Mis M.D.   On: 01/17/2020 09:34   DG CHEST PORT 1 VIEW  Result Date: 01/17/2020 CLINICAL DATA:  COVID-19 positivity EXAM: PORTABLE CHEST 1 VIEW COMPARISON:  01/16/2020 FINDINGS: Diffuse airspace opacity is identified stable in appearance from the prior exam. Feeding catheter, endotracheal tube, left jugular central line and ECMO cannula are again noted and stable. Right-sided PICC line is noted in satisfactory position. IMPRESSION: Tubes and lines as described stable in appearance. Diffuse opacification of lungs bilaterally with  minimal aeration in the central aspect of the right lung. Electronically Signed   By: Inez Catalina M.D.   On: 01/17/2020 08:25   DG CHEST PORT 1 VIEW  Result Date: 01/16/2020 CLINICAL DATA:  Cardio respiratory failure EXAM: PORTABLE CHEST 1 VIEW COMPARISON:  01/16/2020 FINDINGS: Support devices including ECMO are stable. Near complete opacification of both lungs, worsening since prior study. Difficult to visualize the heart. IMPRESSION: Worsening aeration with near complete opacification of both lungs. Electronically Signed   By: Rolm Baptise M.D.   On: 01/16/2020 22:51   DG CHEST PORT 1 VIEW  Result Date: 01/16/2020 CLINICAL DATA:  ECMO EXAM: PORTABLE CHEST 1 VIEW COMPARISON:  01/16/2020 FINDINGS: Support devices are stable. Diffuse airspace disease throughout the lungs, stable or slightly worsening since prior study. Suspect layering effusions. Heart is mildly enlarged. No acute bony abnormality. IMPRESSION: Severe diffuse bilateral airspace disease, stable or slightly worsening. Suspect layering effusions. Electronically Signed   By: Rolm Baptise M.D.   On: 01/16/2020 18:19   DG Chest Port 1 View in am  Result Date: 01/16/2020 CLINICAL DATA:  Hypoxia EXAM: PORTABLE CHEST 1 VIEW COMPARISON:  January 15, 2020. FINDINGS: Endotracheal tube tip is 3.6 cm above the carina. ECMO catheter tip is below the diaphragm. Feeding tube tip is below the diaphragm. Central catheter tip is in the superior vena cava. No pneumothorax. There are pleural effusions bilaterally with multifocal airspace opacity, stable. Heart is enlarged, stable. No adenopathy appreciable. Postoperative change noted in the lower cervical region. IMPRESSION: Tube and catheter positions as described without pneumothorax.  Pleural effusions bilaterally with widespread airspace consolidation persists. Stable cardiomegaly. Electronically Signed   By: Lowella Grip III M.D.   On: 01/16/2020 08:19   DG Chest Port 1 View  Result Date:  01/30/2020 CLINICAL DATA:  ECMO. EXAM: PORTABLE CHEST 1 VIEW COMPARISON:  Earlier today. FINDINGS: Interval right jugular ECMO catheter extending into the mid to lower abdomen, with its tip not included. Feeding tube tip in the mid stomach. Endotracheal tube tip 1.2 cm above the carina. Left jugular catheter tip in the superior vena cava. Right PICC tip in the region of the superior cavoatrial junction. Extensive dense bilateral airspace opacity with mild improvement. Interval visualization of the right heart border without visualization of the left heart borders. Mild thoracic spine degenerative changes. IMPRESSION: 1. Interval right jugular ECMO catheter extending into the mid to lower abdomen, with its tip not included. 2. Extensive dense bilateral pneumonia for alveolar edema with mild improvement. 3. Endotracheal tube tip 1.2 cm above the carina. This could be retracted 3 cm for better positioning. These results will be called to the ordering clinician or representative by the Radiologist Assistant, and communication documented in the PACS or [employer]. Electronically Signed   By: Claudie Revering M.D.   On: 01/24/2020 21:21   DG Chest Port 1 View  Result Date: 02/06/2020 CLINICAL DATA:  54 year old female with respiratory failure due to COVID 19. EXAM: PORTABLE CHEST 1 VIEW COMPARISON:  01/13/2020 FINDINGS: The ETT tip is stable above the carina. There is a feeding tube with tip noted just below the level of the GE junction. The right arm PICC line tip is in the distal SVC. Significant interval progression of bilateral pulmonary opacities. IMPRESSION: 1. Significant interval progression of bilateral pulmonary opacities compatible with progressive multifocal pneumonia. 2. Stable support apparatus. 3. Tip of the feeding tube is just below the level of the GE junction. Electronically Signed   By: Kerby Moors M.D.   On: 02/04/2020 10:27   DG Chest Port 1 View  Result Date: 01/13/2020 CLINICAL DATA:   Respiratory failure EXAM: PORTABLE CHEST 1 VIEW COMPARISON:  01/12/2020 FINDINGS: Endotracheal tube tip is at the level of the clavicular heads. Esophageal tube is poorly visualized on the current study. Bilateral airspace opacities are unchanged. IMPRESSION: Unchanged bilateral airspace opacities Electronically Signed   By: Ulyses Jarred M.D.   On: 01/13/2020 05:53   DG CHEST PORT 1 VIEW  Result Date: 01/12/2020 CLINICAL DATA:  COVID positive EXAM: PORTABLE CHEST 1 VIEW COMPARISON:  August fifth 2021 FINDINGS: The cardiomediastinal silhouette is unchanged and enlarged in contour.He remains partially obscured. ETT tip terminates 2.4 cm above the carina. The enteric tube courses through the chest to the abdomen beyond the field-of-view. RIGHT upper extremity PICC tip terminates over the superior cavoatrial junction. No pleural effusion. No pneumothorax. Diffuse airspace opacities bilaterally, similar to minimally improved in comparison to prior. Visualized abdomen is unremarkable. No acute osseous abnormality appreciated. IMPRESSION: 1. Diffuse airspace opacities bilaterally, similar to minimally improved in comparison to prior and consistent with the sequela of COVID-19 infection. 2. Stable support apparatus. Electronically Signed   By: Valentino Saxon MD   On: 01/12/2020 09:44   DG Chest Port 1 View  Result Date: 01/11/2020 CLINICAL DATA:  COVID-19 positive. EXAM: PORTABLE CHEST 1 VIEW COMPARISON:  January 11, 2020. FINDINGS: Endotracheal and feeding tubes are unchanged in position. Right-sided PICC line is again noted and unchanged. Diffuse lung opacities are noted bilaterally consistent with multifocal pneumonia. No definite pneumothorax  or significant pleural effusion is noted. Bony thorax is unremarkable. IMPRESSION: Stable support apparatus. Stable bilateral diffuse lung opacities consistent with multifocal pneumonia. Electronically Signed   By: Marijo Conception M.D.   On: 01/11/2020 09:35   DG Chest  Port 1 View  Result Date: 01/11/2020 CLINICAL DATA:  Respiratory failure.  COVID. EXAM: PORTABLE CHEST 1 VIEW COMPARISON:  01/10/2020. FINDINGS: Endotracheal tube, feeding tube, right PICC line in stable position. Heart size normal. Progressive diffuse severe bilateral pulmonary infiltrates with near complete opacification of both lungs. No pleural effusion or pneumothorax. Prior cervical spine fusion. IMPRESSION: 1. Endotracheal tube, feeding tube, right PICC line stable position. 2. Progressive diffuse severe bilateral pulmonary infiltrates with near complete opacification of both lungs. Electronically Signed   By: Marcello Moores  Register   On: 01/11/2020 07:08   DG Chest Port 1 View  Result Date: 01/10/2020 CLINICAL DATA:  54 year old female with intubation. EXAM: PORTABLE CHEST 1 VIEW COMPARISON:  Earlier radiograph dated 01/10/2020. FINDINGS: Bilateral pulmonary opacities as seen on the earlier radiograph. No large pleural effusion. No pneumothorax. Stable cardiac silhouette. No acute osseous pathology. IMPRESSION: Bilateral pulmonary opacities as seen on the earlier radiograph. Continued follow-up recommended. Electronically Signed   By: Anner Crete M.D.   On: 01/10/2020 19:59   DG CHEST PORT 1 VIEW  Result Date: 01/10/2020 CLINICAL DATA:  Hypoxia EXAM: PORTABLE CHEST 1 VIEW COMPARISON:  January 10, 2020 study obtained earlier in the day FINDINGS: Endotracheal tube tip is 4.1 cm above the carina. Nasogastric tube tip and side port are below the diaphragm. Central catheter tip is in the superior vena cava near the cavoatrial junction. No pneumothorax. Multifocal airspace opacity throughout the lungs bilaterally is stable compared to earlier in the day. Heart is upper normal in size with pulmonary vascularity normal. No adenopathy. No bone lesions. Postoperative change noted in the lower cervical spine. IMPRESSION: Tube and catheter positions as described without pneumothorax. Widespread multifocal airspace  opacity appear stable consistent with multifocal pneumonia. A degree of underlying edema and/or ARDS is possible as well. Stable cardiac silhouette. Electronically Signed   By: Lowella Grip III M.D.   On: 01/10/2020 11:51   DG Chest Port 1 View  Result Date: 01/10/2020 CLINICAL DATA:  Endotracheal tube positioning EXAM: PORTABLE CHEST 1 VIEW COMPARISON:  Earlier today FINDINGS: Endotracheal tube tip at the clavicular heads. The enteric tube reaches the stomach. Right PICC with tip at the lower SVC. Severe bilateral airspace disease. Lung volumes are low. Largely obscured heart which appears normal size. IMPRESSION: Unchanged hardware positioning and severe airspace disease. Electronically Signed   By: Monte Fantasia M.D.   On: 01/10/2020 06:12   DG Chest Port 1 View  Result Date: 01/10/2020 CLINICAL DATA:  Hypoxia.  COVID-19 ARDS. EXAM: PORTABLE CHEST 1 VIEW COMPARISON:  01/09/2020 FINDINGS: Unchanged position of endotracheal tube with tip at the level of the clavicular heads. Esophageal tube courses below the field of view. Bilateral airspace opacities have worsened. IMPRESSION: Worsening opacities, likely pulmonary edema. Electronically Signed   By: Ulyses Jarred M.D.   On: 01/10/2020 01:38   DG CHEST PORT 1 VIEW  Result Date: 01/09/2020 CLINICAL DATA:  Status post intubation. EXAM: PORTABLE CHEST 1 VIEW COMPARISON:  January 09, 2020 (1:21 p.m.) FINDINGS: An endotracheal tube is seen with its distal tip approximately 2.9 cm from the carina. A nasogastric tube is noted with its distal end overlying the body of the stomach. Mild diffusely increased lung markings are seen which is decreased  in severity when compared to the prior study. Mild atelectasis and/or infiltrate is noted within the mid left lung and bilateral lung bases. The cardiac silhouette is moderately enlarged. The visualized skeletal structures are unremarkable. IMPRESSION: 1. Endotracheal tube and nasogastric tube positioning, as  described above. 2. Mild atelectasis and/or infiltrate noted within the mid left lung and bilateral lung bases. Electronically Signed   By: Virgina Norfolk M.D.   On: 01/09/2020 17:09   DG CHEST PORT 1 VIEW  Result Date: 01/09/2020 CLINICAL DATA:  Hypoxia.  Reported COVID-19 positive EXAM: PORTABLE CHEST 1 VIEW COMPARISON:  January 08, 2020 FINDINGS: There is widespread airspace opacity bilaterally, overall increased compared to 1 day prior. Heart is upper normal in size with pulmonary vascularity normal. No adenopathy appreciable. Postoperative change noted in the lower cervical region. IMPRESSION: Widespread airspace opacity bilaterally, increased from 1 day prior. Suspect multifocal pneumonia, likely of atypical organism etiology, particularly given the clinical history. Note that a degree of pulmonary edema and/or ARDS superimposed cannot be excluded radiographically. Stable cardiac silhouette. Electronically Signed   By: Lowella Grip III M.D.   On: 01/09/2020 13:42   DG Chest Port 1 View  Result Date: 01/07/2020 CLINICAL DATA:  Shortness of breath.  COVID-19 positive EXAM: PORTABLE CHEST 1 VIEW COMPARISON:  January 06, 2020 FINDINGS: There is ill-defined patchy airspace disease in the left mid lung and lung base regions. No consolidation. Heart size and pulmonary vascularity are normal. No adenopathy. There is postoperative change in the lower cervical region. IMPRESSION: Areas of somewhat ill-defined patchy airspace disease, likely due to atypical organism pneumonia. Heart size and pulmonary vascular normal. No adenopathy. Electronically Signed   By: Lowella Grip III M.D.   On: 01/28/2020 11:09   DG Chest Port 1 View  Result Date: 01/06/2020 CLINICAL DATA:  COVID-19 positivity with headaches and chest pain, initial encounter EXAM: PORTABLE CHEST 1 VIEW COMPARISON:  06/28/2004 FINDINGS: Cardiac shadow is within normal limits. The lungs are well aerated bilaterally. Minimal bibasilar atelectatic  changes are seen. No bony abnormality is noted. IMPRESSION: Mild bibasilar atelectasis. Electronically Signed   By: Inez Catalina M.D.   On: 01/06/2020 19:20   ECHOCARDIOGRAM COMPLETE  Result Date: 01/16/2020    ECHOCARDIOGRAM REPORT   Patient Name:   BRINDLEY MADARANG Date of Exam: 01/16/2020 Medical Rec #:  [id number]       Height:       64.0 in Accession #:    [id number]      Weight:       293.7 lb Date of Birth:  [birth year]        BSA:          2.303 m Patient Age:    73 years        BP:           109/49 mmHg Patient Gender: F               HR:           132 bpm. Exam Location:  Inpatient Procedure: 2D Echo, Cardiac Doppler and Color Doppler Indications:    Acute resp. insufficiency  History:        Patient has prior history of Echocardiogram examinations, most                 recent 01/10/2020. Signs/Symptoms:Shortness of Breath; Risk                 Factors:Diabetes and Obesity. Covid, resp. failure.  Sonographer:    Dustin Flock Referring Phys: [id number] DANIEL R BENSIMHON IMPRESSIONS  1. Left ventricular ejection fraction, by estimation, is >75%. The left ventricle has hyperdynamic function. The left ventricle has no regional wall motion abnormalities. Left ventricular diastolic function could not be evaluated.  2. Right ventricular systolic function is hyperdynamic. The right ventricular size is normal. Tricuspid regurgitation signal is inadequate for assessing PA pressure.  3. The mitral valve is grossly normal. No evidence of mitral valve regurgitation.  4. The aortic valve was not well visualized. Aortic valve regurgitation is not visualized.  5. The inferior vena cava is normal in size with <50% respiratory variability, suggesting right atrial pressure of 8 mmHg. FINDINGS  Left Ventricle: Left ventricular ejection fraction, by estimation, is >75%. The left ventricle has hyperdynamic function. The left ventricle has no regional wall motion abnormalities. The left ventricular internal cavity size was normal  in size. There is no left ventricular hypertrophy. Left ventricular diastolic function could not be evaluated. Right Ventricle: The right ventricular size is normal. No increase in right ventricular wall thickness. Right ventricular systolic function is hyperdynamic. Tricuspid regurgitation signal is inadequate for assessing PA pressure. Left Atrium: Left atrial size was normal in size. Right Atrium: Right atrial size was normal in size. Pericardium: There is no evidence of pericardial effusion. Mitral Valve: The mitral valve is grossly normal. No evidence of mitral valve regurgitation. Tricuspid Valve: The tricuspid valve is not assessed. Tricuspid valve regurgitation is not demonstrated. Aortic Valve: The aortic valve was not well visualized. Aortic valve regurgitation is not visualized. Pulmonic Valve: The pulmonic valve was not assessed. Pulmonic valve regurgitation is not visualized. Aorta: The aortic root was not well visualized. Venous: The inferior vena cava is normal in size with less than 50% respiratory variability, suggesting right atrial pressure of 8 mmHg. IAS/Shunts: The interatrial septum was not assessed. Sanda Klein MD Electronically signed by Sanda Klein MD Signature Date/Time: 01/16/2020/9:56:05 AM    Final    ECHOCARDIOGRAM COMPLETE  Result Date: 01/10/2020    ECHOCARDIOGRAM REPORT   Patient Name:   ANALYSE ANGST Date of Exam: 01/10/2020 Medical Rec #:  [id number]       Height:       64.0 in Accession #:    [id number]      Weight:       270.0 lb Date of Birth:  10/28/1965        BSA:          2.222 m Patient Age:    53 years        BP:           105/67 mmHg Patient Gender: F               HR:           98 bpm. Exam Location:  Inpatient Procedure: 2D Echo, Cardiac Doppler and Color Doppler                       STAT ECHO Reported to: Dr Dani Gobble Croitoru on 01/10/2020 1:20:00 PM. Indications:    Acute Respiratory Insufficiency 518.82 / R06.89  History:        Patient has prior history of  Echocardiogram examinations, most                 recent 03/28/2015. Risk Factors:Diabetes. COVID-19 Positive.  Sonographer:    Jonelle Sidle Dance Referring Phys: [id number] PETER E BABCOCK  Sonographer Comments: Echo performed with  patient supine and on artificial respirator, suboptimal subcostal window, Technically difficult study due to poor echo windows and patient is morbidly obese. IMPRESSIONS  1. Left ventricular ejection fraction, by estimation, is 60 to 65%. The left ventricle has normal function. The left ventricle has no regional wall motion abnormalities. Left ventricular diastolic parameters are consistent with Grade I diastolic dysfunction (impaired relaxation).  2. Right ventricular systolic function is normal. The right ventricular size is normal.  3. The mitral valve is normal in structure. No evidence of mitral valve regurgitation. No evidence of mitral stenosis.  4. The aortic valve is normal in structure. Aortic valve regurgitation is not visualized. No aortic stenosis is present.  5. The inferior vena cava is normal in size with greater than 50% respiratory variability, suggesting right atrial pressure of 3 mmHg. FINDINGS  Left Ventricle: Left ventricular ejection fraction, by estimation, is 60 to 65%. The left ventricle has normal function. The left ventricle has no regional wall motion abnormalities. The left ventricular internal cavity size was normal in size. There is  no left ventricular hypertrophy. Left ventricular diastolic parameters are consistent with Grade I diastolic dysfunction (impaired relaxation). Indeterminate filling pressures. Right Ventricle: The right ventricular size is normal. No increase in right ventricular wall thickness. Right ventricular systolic function is normal. Left Atrium: Left atrial size was normal in size. Right Atrium: Right atrial size was normal in size. Pericardium: There is no evidence of pericardial effusion. Mitral Valve: The mitral valve is normal in structure.  Normal mobility of the mitral valve leaflets. No evidence of mitral valve regurgitation. No evidence of mitral valve stenosis. Tricuspid Valve: The tricuspid valve is normal in structure. Tricuspid valve regurgitation is not demonstrated. No evidence of tricuspid stenosis. Aortic Valve: The aortic valve is normal in structure. Aortic valve regurgitation is not visualized. No aortic stenosis is present. Pulmonic Valve: The pulmonic valve was normal in structure. Pulmonic valve regurgitation is not visualized. No evidence of pulmonic stenosis. Aorta: The aortic root is normal in size and structure. Venous: The inferior vena cava is normal in size with greater than 50% respiratory variability, suggesting right atrial pressure of 3 mmHg. IAS/Shunts: No atrial level shunt detected by color flow Doppler.  LEFT VENTRICLE PLAX 2D LVIDd:         3.70 cm  Diastology LVIDs:         2.70 cm  LV e' lateral:   4.57 cm/s LV PW:         1.20 cm  LV E/e' lateral: 15.4 LV IVS:        1.00 cm  LV e' medial:    4.57 cm/s LVOT diam:     2.20 cm  LV E/e' medial:  15.4 LV SV:         59 LV SV Index:   26 LVOT Area:     3.80 cm  RIGHT VENTRICLE RV Basal diam:  2.70 cm RV S prime:     13.60 cm/s TAPSE (M-mode): 1.2 cm LEFT ATRIUM             Index       RIGHT ATRIUM           Index LA diam:        3.10 cm 1.39 cm/m  RA Area:     10.40 cm LA Vol (A2C):   27.5 ml 12.37 ml/m RA Volume:   19.50 ml  8.77 ml/m LA Vol (A4C):   26.5 ml 11.92 ml/m LA Biplane  Vol: 26.9 ml 12.10 ml/m  AORTIC VALVE LVOT Vmax:   92.80 cm/s LVOT Vmean:  59.300 cm/s LVOT VTI:    0.154 m  AORTA Ao Root diam: 2.90 cm Ao Asc diam:  3.20 cm MITRAL VALVE MV Area (PHT): 2.48 cm    SHUNTS MV Decel Time: 306 msec    Systemic VTI:  0.15 m MV E velocity: 70.40 cm/s  Systemic Diam: 2.20 cm MV A velocity: 76.40 cm/s MV E/A ratio:  0.92 Mihai Croitoru MD Electronically signed by Sanda Klein MD Signature Date/Time: 01/10/2020/1:25:37 PM    Final    [place] EKG SITE RITE  Result  Date: 01/09/2020 If Site Rite image not attached, placement could not be confirmed due to current cardiac rhythm.  US Abdomen Limited RUQ  Result Date: 01/18/2020 CLINICAL DATA:  Hyperbilirubinemia.  History of cholecystectomy. EXAM: ULTRASOUND ABDOMEN LIMITED RIGHT UPPER QUADRANT COMPARISON:  None. FINDINGS: Gallbladder: Surgically absent. Common bile duct: Diameter: 3 mm Liver: Diffusely echogenic indicating fatty infiltration. No focal mass or lesion is identified within the liver. Portal vein is patent on color Doppler imaging with normal direction of blood flow towards the liver. Other: None. IMPRESSION: 1. No acute findings. 2. No bile duct dilatation. 3. Fatty infiltration of the liver. 4. Status post cholecystectomy. Electronically Signed   By: Franki Cabot M.D.   On: 01/18/2020 16:29    Assessment: 54 y.o. female with PMH of diabetes and morbid obesity admitted for Covid pneumonia on 8/2, intubated 8/3, and put on ECMO 8/9.  During the hospitalization, patient developed multiorgan involvement including uremia, shock liver, thrombocytopenia, bacteremia with Enterococcus sepsis, hyperglycemia due to uncontrolled diabetes, hypernatremia, hypotension, and Covid ARDS.  Due to persistent encephalopathy, CT head performed and showed multifocal ICH with largest within the right frontoparietal area.  Angiomax discontinued.  Neurology was consulted for further evaluation.  Etiology for patient multifocal ICH uncertain, DDx including  Anticoagulated use in ECMO  Septic emboli with mycotic aneurysm rupture  Cardioembolic infarct with hemorrhagic conversion  Covid vasculitis  DIC  Discussed with Dr. Haroldine Laws, patient critically ill with multiorgan failure.  Although her multifocal ICH not life-threatening based on size and location, given her multiorgan failure and needed to stop anticoagulation, this will significantly affect the patient treatment and chance of survival.  I agree with Dr.  Haroldine Laws for platelet transfusion, recommend repeat CT in a.m. to document hematoma stabilization, recommend blood smear rule out DIC. Dr. Haroldine Laws will call family regarding current findings and poor prognosis. I agree with palliative care involvement for Stella discussion.    Plan: -Platelet transfusion to keep platelet more than 50 if able -Continue hold off anticoagulation -Repeat CT in the a.m. to document hematoma stabilization -Blood smear to rule out DIC -Dr. Haroldine Laws to discuss with family and consider palliative care involvement for [place] discussion. -will follow in am  Thank you for this consultation and allowing [place] to participate in the care of this patient.  Rosalin Hawking, MD PhD Stroke Neurology 01/19/2020 5:01 PM  This patient is critically ill due to Covid pneumonia, Covid ARDS, multiorgan failure, multifocal ICH and at significant risk of neurological worsening, death form recurrent stroke, hematoma expansion, seizure, cerebral edema, multiorgan failure. This patient's care requires constant monitoring of vital signs, hemodynamics, respiratory and cardiac monitoring, review of multiple databases, neurological assessment, discussion with family, other specialists and medical decision making of high complexity. I spent 40 minutes of neurocritical care time in the care of this patient.  I have discussed with  Dr. Haroldine Laws.

## 2020-01-19 NOTE — Progress Notes (Signed)
Nutrition Follow Up  DOCUMENTATION CODES:   Morbid obesity  INTERVENTION:   Recommend long term feeding tube if patient requires trach  Increase tube feeding:  -Pivot 1.5 @ 75 ml/hr (1800 ml) via NG -45 ml Prosource BID -Free water flushes 300 ml Q2 hours- per HF  Provides: 2780 kcals, 191 grams protein, 1350 ml free water (4,950 ml with flushes).  Continue Juven Fruit Punch BID, each serving provides 95kcal and 2.5g of protein (amino acids glutamine and arginine)  Add B complex with Vitamin C if started on CRRT to account for losses   NUTRITION DIAGNOSIS:   Increased nutrient needs related to acute illness (COVID-19 infection) as evidenced by estimated needs.  Ongoing  GOAL:   Patient will meet greater than or equal to 90% of their needs  Addressed via TF  MONITOR:   Vent status, TF tolerance, Labs, Weight trends  REASON FOR ASSESSMENT:   Ventilator, Consult Enteral/tube feeding initiation and management  ASSESSMENT:   54 y.o. female with medical history of type 2 DM. She presented to the ED at [facility] with SOB diarrhea x1 week, and cough. She was dx with COVID-19 one week prior to presentation to the ED.   8/9- tx from [facility], s/p VV ECMO cannulation  Pt discussed during ICU rounds and with RN.   Volume overloaded. BUN rising. Will likely require CRRT. Encephalopathy persists, plan for head CT to rule out hypoxic brain injury.   Tolerating TF at goal. Had large BM yesterday. Now with rectal tube.   CBGs elevated- 295, 287, 336, 397, 418, 402. Per diabetes coordination, concern that pt is not absorbing SQ insulin. Now on insulin drip per COVID Glycemic control order set.   Patient remains intubated on ventilator support MV: 6.6 L/min Temp (24hrs), Avg:97.9 F (36.6 C), Min:96.8 F (36 C), Max:99.1 F (37.3 C)  Admission weight: 133.6 kg  Current weight: 138.4 kg   I/O: +10,890 ml since admit UOP: 2,810 ml x 24 hrs  Stool: 125 ml x 24 hrs    Drips: precedex, 250 mg lasix in D5 @ 8 ml/hr, insulin  Medications: 500 mg Vitamin C, colace, tradjenta, solumedrol, senokot, 200 mg zinc sulfate Labs: Na 148 (H)-down from yesterday Cr 1.25-trending up BUN 105 CBG 239-418  Diet Order:   Diet Order            Diet NPO time specified  Diet effective now                 EDUCATION NEEDS:   No education needs have been identified at this time  Skin:  Skin Assessment: Skin Integrity Issues: Skin Integrity Issues:: Other (Comment) Other: MASD-breast skin tears-face/chest/chin  Last BM:  8/13  Height:   Ht Readings from Last 1 Encounters:  01/13/2020 5\' 4"  (1.626 m)    Weight:   Wt Readings from Last 1 Encounters:  01/19/20 (!) 138.4 kg    Ideal Body Weight:  54.5 kg Adj Body Weight: 84.7 kg   BMI:  Body mass index is 52.37 kg/m.  Estimated Nutritional Needs:   Kcal:  2202-5427 kcal  Protein:  160-195 grams  Fluid:  >/= 2 L/day  Mariana Single RD, LDN Clinical Nutrition Pager listed in [place]

## 2020-01-19 NOTE — Consult Note (Signed)
[name]                 Reason for Consult: enterococcal bacteremia   Referring Physician: Ruthann Cancer  Principal Problem:   Acute respiratory failure due to COVID-19 [facility]) Active Problems:   NAFLD (nonalcoholic fatty liver disease)   Diabetes mellitus type 1.5, managed as type 1 ([place])   Pneumonia due to COVID-19 virus   Intracerebral hemorrhage    HPI: Alisha Stephenson is a 54 y.o. female with hx of DM and obesity was admitted with COVID pneumonia on 8/2 quickly progressed to requiring intubation and further worsening requiring ECMO on 8/9. She has multi organ failure with AKI, transaminitis, and ongoing pressor requirements. Had noticed increase LDH, low platelets and increased LA which prompted getting pan cultures. Her blood cx grew enterococcus and respiratory cultures having staph aureus. She is on vancomycin and piptazo  She has lines for ECMO in place, left IJ, right triple lumen picc line and art line, all needed and unable to be removed presently.   Past Medical History:  Diagnosis Date  . Asthma   . Diabetes mellitus without complication ([place])   . Diverticulitis   . Gallstones   . IBS (irritable bowel syndrome)   . NAFLD (nonalcoholic fatty liver disease)    CT scan 2015  . Ovarian cyst     Allergies:  Allergies  Allergen Reactions  . Sulfa Antibiotics Rash and Other (See Comments)    MEDICATIONS: . artificial tears  1 application Both Eyes Z6X  . vitamin C  500 mg Per Tube Daily  . chlorhexidine gluconate (MEDLINE KIT)  15 mL Mouth Rinse BID  . Chlorhexidine Gluconate Cloth  6 each Topical Daily  . cisatracurium  10 mg Intravenous Once  . clonazePAM  1 mg Per Tube BID  . docusate  100 mg Per Tube BID  . feeding supplement (PROSource TF)  45 mL Per Tube BID  . fentaNYL (SUBLIMAZE) injection  50 mcg Intravenous Once  . free water  300 mL Per Tube Q2H  . linagliptin  5 mg Per Tube Daily  . mouth rinse  15 mL Mouth Rinse 10 times  per day  . methylPREDNISolone (SOLU-MEDROL) injection  40 mg Intravenous Daily  . nutrition supplement (JUVEN)  1 packet Per Tube BID BM  . oxyCODONE  5 mg Per Tube Q6H  . pantoprazole sodium  40 mg Per Tube Daily  . polyethylene glycol  17 g Per Tube Daily  . sennosides  5 mL Per Tube BID  . sodium chloride flush  10-40 mL Intracatheter Q12H  . zinc sulfate  220 mg Per Tube Daily    Social History   Tobacco Use  . Smoking status: Former Research scientist (life sciences)  . Smokeless tobacco: Never Used  . Tobacco comment: quit over 10 years ago  Vaping Use  . Vaping Use: Never used  Substance Use Topics  . Alcohol use: No    Comment: rarely  . Drug use: No    Family History  Problem Relation Age of Onset  . Diabetes Father   . Heart disease Father   . Breast cancer Maternal Grandmother   . Breast cancer Maternal Aunt   . Colon cancer Neg Hx   . Esophageal cancer Neg Hx     Review of Systems - Unable to obtain due to sedation/intubation  OBJECTIVE: Temp:  [95.4 F (35.2 C)-99.1 F (37.3 C)] 95.4 F (35.2 C) (08/13 1729) Pulse Rate:  [  49-99] 71 (08/13 1729) Resp:  [0-34] 25 (08/13 1729) BP: (88-129)/(50-57) 88/50 (08/13 1700) SpO2:  [84 %-100 %] 95 % (08/13 1729) Arterial Line BP: (91-151)/(45-69) 123/55 (08/13 1729) FiO2 (%):  [60 %] 60 % (08/13 1429) Weight:  [138.4 kg] 138.4 kg (08/13 0500) Physical Exam  Constitutional:  Sedated intubated. appears well-developed and well-nourished. No distress.  HENT: slight scleral icterus, conjunctival edema Mouth/Throat: blood crusted on lips. OETT in place Neck: lumens for ECMO, IJ appropriately dressed Cardiovascular: Normal rate, regular rhythm and normal heart sounds. Exam reveals no gallop and no friction rub.  No murmur heard.  Pulmonary/Chest: Effort normal and breath sounds rhonchi Abdominal: Soft. Bowel sounds are normal.  exhibits no distension. There is no tenderness.  Ext: +2 edema Skin: Skin is warm and dry. No rash noted. No  erythema.   LABS: Results for orders placed or performed during the hospital encounter of 01/26/2020 (from the past 48 hour(s))  Glucose, capillary     Status: Abnormal   Collection Time: 01/17/20  8:04 PM  Result Value Ref Range   Glucose-Capillary 340 (H) 70 - 99 mg/dL    Comment: Glucose reference range applies only to samples taken after fasting for at least 8 hours.  Lactic acid, plasma     Status: Abnormal   Collection Time: 01/17/20  8:06 PM  Result Value Ref Range   Lactic Acid, Venous 2.1 (HH) 0.5 - 1.9 mmol/L    Comment: CRITICAL VALUE NOTED.  VALUE IS CONSISTENT WITH PREVIOUSLY REPORTED AND CALLED VALUE. Performed at [facility], [place] [address], [place], [place] [address]   I-STAT 7, (LYTES, BLD GAS, ICA, H+H)     Status: Abnormal   Collection Time: 01/17/20  8:08 PM  Result Value Ref Range   pH, Arterial 7.481 (H) 7.35 - 7.45   pCO2 arterial 41.5 32 - 48 mmHg   pO2, Arterial 86 83 - 108 mmHg   Bicarbonate 31.0 (H) 20.0 - 28.0 mmol/L   TCO2 32 22 - 32 mmol/L   O2 Saturation 97.0 %   Acid-Base Excess 7.0 (H) 0.0 - 2.0 mmol/L   Sodium 151 (H) 135 - 145 mmol/L   Potassium 4.7 3.5 - 5.1 mmol/L   Calcium, Ion 1.22 1.15 - 1.40 mmol/L   HCT 29.0 (L) 36 - 46 %   Hemoglobin 9.9 (L) 12.0 - 15.0 g/dL   Patient temperature 36.7 C    Sample type ARTERIAL   Glucose, capillary     Status: Abnormal   Collection Time: 01/17/20 11:19 PM  Result Value Ref Range   Glucose-Capillary 313 (H) 70 - 99 mg/dL    Comment: Glucose reference range applies only to samples taken after fasting for at least 8 hours.  I-STAT 7, (LYTES, BLD GAS, ICA, H+H)     Status: Abnormal   Collection Time: 01/17/20 11:21 PM  Result Value Ref Range   pH, Arterial 7.445 7.35 - 7.45   pCO2 arterial 42.3 32 - 48 mmHg   pO2, Arterial 61 (L) 83 - 108 mmHg   Bicarbonate 29.1 (H) 20.0 - 28.0 mmol/L   TCO2 30 22 - 32 mmol/L   O2 Saturation 92.0 %   Acid-Base Excess 4.0 (H) 0.0 - 2.0 mmol/L   Sodium 152 (H)  135 - 145 mmol/L   Potassium 4.5 3.5 - 5.1 mmol/L   Calcium, Ion 1.20 1.15 - 1.40 mmol/L   HCT 31.0 (L) 36 - 46 %   Hemoglobin 10.5 (L) 12.0 - 15.0  g/dL   Patient temperature 36.7 C    Sample type ARTERIAL   CBC     Status: Abnormal   Collection Time: 01/18/20  3:55 AM  Result Value Ref Range   WBC 9.0 4.0 - 10.5 K/uL   RBC 3.77 (L) 3.87 - 5.11 MIL/uL   Hemoglobin 10.8 (L) 12.0 - 15.0 g/dL   HCT 35.0 (L) 36 - 46 %   MCV 92.8 80.0 - 100.0 fL   MCH 28.6 26.0 - 34.0 pg   MCHC 30.9 30.0 - 36.0 g/dL   RDW 15.7 (H) 11.5 - 15.5 %   Platelets 63 (L) 150 - 400 K/uL    Comment: REPEATED TO VERIFY Immature Platelet Fraction may be clinically indicated, consider ordering this additional test [id number] CONSISTENT WITH PREVIOUS RESULT    nRBC 2.0 (H) 0.0 - 0.2 %    Comment: Performed at [facility], [place] [address], [place], [place] [address]  Basic metabolic panel     Status: Abnormal   Collection Time: 01/18/20  3:55 AM  Result Value Ref Range   Sodium 153 (H) 135 - 145 mmol/L   Potassium 3.8 3.5 - 5.1 mmol/L   Chloride 114 (H) 98 - 111 mmol/L   CO2 28 22 - 32 mmol/L   Glucose, Bld 259 (H) 70 - 99 mg/dL    Comment: Glucose reference range applies only to samples taken after fasting for at least 8 hours.   BUN 102 (H) 6 - 20 mg/dL   Creatinine, Ser 1.23 (H) 0.44 - 1.00 mg/dL   Calcium 8.7 (L) 8.9 - 10.3 mg/dL   GFR calc non Af Amer 49 (L) >60 mL/min   GFR calc Af Amer 57 (L) >60 mL/min   Anion gap 11 5 - 15    Comment: Performed at [facility] [address], [place], [place] [address]  Lactate dehydrogenase     Status: Abnormal   Collection Time: 01/18/20  3:55 AM  Result Value Ref Range   LDH 623 (H) 98 - 192 U/L    Comment: Performed at [facility], [place] [address], [place], [place] [address]  Protime-INR     Status: Abnormal   Collection Time: 01/18/20  3:55 AM  Result Value Ref Range   Prothrombin Time 22.3 (H) 11.4 - 15.2 seconds   INR 2.0 (H) 0.8 -  1.2    Comment: (NOTE) INR goal varies based on device and disease states. Performed at [facility], [place] [address], , [place] [address]   Fibrinogen     Status: None   Collection Time: 01/18/20  3:55 AM  Result Value Ref Range   Fibrinogen 232 210 - 475 mg/dL    Comment: Performed at [facility] [address], [place], [place] [address]  Hepatic function panel     Status: Abnormal   Collection Time: 01/18/20  3:55 AM  Result Value Ref Range   Total Protein 4.7 (L) 6.5 - 8.1 g/dL   Albumin 3.1 (L) 3.5 - 5.0 g/dL   AST 50 (H) 15 - 41 U/L   ALT 54 (H) 0 - 44 U/L   Alkaline Phosphatase 342 (H) 38 - 126 U/L   Total Bilirubin 1.3 (H) 0.3 - 1.2 mg/dL   Bilirubin, Direct 0.3 (H) 0.0 - 0.2 mg/dL   Indirect Bilirubin 1.0 (H) 0.3 - 0.9 mg/dL    Comment: Performed at [facility] [place],  [place] [address]  APTT     Status: Abnormal   Collection Time: 01/18/20  3:55 AM  Result Value Ref Range   aPTT 51 (H) 24 - 36 seconds    Comment:        IF BASELINE aPTT IS ELEVATED, SUGGEST PATIENT RISK ASSESSMENT BE USED TO DETERMINE APPROPRIATE ANTICOAGULANT THERAPY. Performed at [facility], [place] [address] [place], [place] [address]   Lactic acid, plasma     Status: Abnormal   Collection Time: 01/18/20  3:55 AM  Result Value Ref Range   Lactic Acid, Venous 3.3 (HH) 0.5 - 1.9 mmol/L    Comment: CRITICAL VALUE NOTED.  VALUE IS CONSISTENT WITH PREVIOUSLY REPORTED AND CALLED VALUE. Performed at [facility], [place] [address], [place], [place] [address]   Glucose, capillary     Status: Abnormal   Collection Time: 01/18/20  4:00 AM  Result Value Ref Range   Glucose-Capillary 239 (H) 70 - 99 mg/dL    Comment: Glucose reference range applies only to samples taken after fasting for at least 8 hours.  I-STAT 7, (LYTES, BLD GAS, ICA, H+H)     Status: Abnormal   Collection Time: 01/18/20  4:03 AM  Result Value Ref Range   pH, Arterial 7.446  7.35 - 7.45   pCO2 arterial 40.9 32 - 48 mmHg   pO2, Arterial 59 (L) 83 - 108 mmHg   Bicarbonate 28.3 (H) 20.0 - 28.0 mmol/L   TCO2 30 22 - 32 mmol/L   O2 Saturation 92.0 %   Acid-Base Excess 4.0 (H) 0.0 - 2.0 mmol/L   Sodium 154 (H) 135 - 145 mmol/L   Potassium 3.7 3.5 - 5.1 mmol/L   Calcium, Ion 1.22 1.15 - 1.40 mmol/L   HCT 31.0 (L) 36 - 46 %   Hemoglobin 10.5 (L) 12.0 - 15.0 g/dL   Patient temperature 36.3 C    Sample type ARTERIAL   Glucose, capillary     Status: Abnormal   Collection Time: 01/18/20  8:36 AM  Result Value Ref Range   Glucose-Capillary 365 (H) 70 - 99 mg/dL    Comment: Glucose reference range applies only to samples taken after fasting for at least 8 hours.  I-STAT 7, (LYTES, BLD GAS, ICA, H+H)     Status: Abnormal   Collection Time: 01/18/20  8:37 AM  Result Value Ref Range   pH, Arterial 7.449 7.35 - 7.45   pCO2 arterial 40.8 32 - 48 mmHg   pO2, Arterial 48 (L) 83 - 108 mmHg   Bicarbonate 28.6 (H) 20.0 - 28.0 mmol/L   TCO2 30 22 - 32 mmol/L   O2 Saturation 88.0 %   Acid-Base Excess 4.0 (H) 0.0 - 2.0 mmol/L   Sodium 149 (H) 135 - 145 mmol/L   Potassium 5.0 3.5 - 5.1 mmol/L   Calcium, Ion 1.15 1.15 - 1.40 mmol/L   HCT 32.0 (L) 36 - 46 %   Hemoglobin 10.9 (L) 12.0 - 15.0 g/dL   Patient temperature 35.8 C    Sample type ARTERIAL   Lactic acid, plasma     Status: Abnormal   Collection Time: 01/18/20  8:39 AM  Result Value Ref Range   Lactic Acid, Venous 3.4 (HH) 0.5 - 1.9 mmol/L    Comment: CRITICAL VALUE NOTED.  VALUE IS CONSISTENT WITH PREVIOUSLY REPORTED AND CALLED VALUE. Performed at [facility], [place] [address], [place], [place] [address]   Culture, respiratory (non-expectorated)  Status: None (Preliminary result)   Collection Time: 01/18/20  8:44 AM   Specimen: Tracheal Aspirate; Respiratory  Result Value Ref Range   Specimen Description TRACHEAL ASPIRATE    Special Requests NONE    Gram Stain      NO WBC SEEN FEW GRAM POSITIVE COCCI  IN PAIRS IN CLUSTERS Performed at [facility], [address] [place], [place] [address]    Culture ABUNDANT STAPHYLOCOCCUS AUREUS    Report Status PENDING   Culture, blood (Routine X 2) w Reflex to ID Panel     Status: Abnormal (Preliminary result)   Collection Time: 01/18/20 10:17 AM   Specimen: BLOOD  Result Value Ref Range   Specimen Description BLOOD LEFT ANTECUBITAL    Special Requests      BOTTLES DRAWN AEROBIC ONLY Blood Culture adequate volume   Culture  Setup Time      AEROBIC BOTTLE ONLY GRAM POSITIVE COCCI Organism ID to follow CRITICAL RESULT CALLED TO, READ BACK BY AND VERIFIED WITH: G ABBOTT PHARMD 01/19/20 0044 JDW    Culture (A)     ENTEROCOCCUS FAECALIS SUSCEPTIBILITIES TO FOLLOW Performed at [facility], [place] [address] [place], [place] [address]    Report Status PENDING   Blood Culture ID Panel (Reflexed)     Status: Abnormal   Collection Time: 01/18/20 10:17 AM  Result Value Ref Range   Enterococcus faecalis DETECTED (A) NOT DETECTED    Comment: CRITICAL RESULT CALLED TO, READ BACK BY AND VERIFIED WITH: G ABBOTT PHARMD 01/19/20 0044 JDW    Enterococcus Faecium NOT DETECTED NOT DETECTED   Listeria monocytogenes NOT DETECTED NOT DETECTED   Staphylococcus species NOT DETECTED NOT DETECTED   Staphylococcus aureus (BCID) NOT DETECTED NOT DETECTED   Staphylococcus epidermidis NOT DETECTED NOT DETECTED   Staphylococcus lugdunensis NOT DETECTED NOT DETECTED   Streptococcus species NOT DETECTED NOT DETECTED   Streptococcus agalactiae NOT DETECTED NOT DETECTED   Streptococcus pneumoniae NOT DETECTED NOT DETECTED   Streptococcus pyogenes NOT DETECTED NOT DETECTED   A.calcoaceticus-baumannii NOT DETECTED NOT DETECTED   Bacteroides fragilis NOT DETECTED NOT DETECTED   Enterobacterales NOT DETECTED NOT DETECTED   Enterobacter cloacae complex NOT DETECTED NOT DETECTED   Escherichia coli NOT DETECTED NOT DETECTED   Klebsiella aerogenes NOT DETECTED NOT  DETECTED   Klebsiella oxytoca NOT DETECTED NOT DETECTED   Klebsiella pneumoniae NOT DETECTED NOT DETECTED   Proteus species NOT DETECTED NOT DETECTED   Salmonella species NOT DETECTED NOT DETECTED   Serratia marcescens NOT DETECTED NOT DETECTED   Haemophilus influenzae NOT DETECTED NOT DETECTED   Neisseria meningitidis NOT DETECTED NOT DETECTED   Pseudomonas aeruginosa NOT DETECTED NOT DETECTED   Stenotrophomonas maltophilia NOT DETECTED NOT DETECTED   Candida albicans NOT DETECTED NOT DETECTED   Candida auris NOT DETECTED NOT DETECTED   Candida glabrata NOT DETECTED NOT DETECTED   Candida krusei NOT DETECTED NOT DETECTED   Candida parapsilosis NOT DETECTED NOT DETECTED   Candida tropicalis NOT DETECTED NOT DETECTED   Cryptococcus neoformans/gattii NOT DETECTED NOT DETECTED   Vancomycin resistance NOT DETECTED NOT DETECTED    Comment: Performed at [facility], [address], [place], [place] [address]  Culture, blood (Routine X 2) w Reflex to ID Panel     Status: None (Preliminary result)   Collection Time: 01/18/20 10:30 AM   Specimen: BLOOD LEFT ARM  Result Value Ref Range   Specimen Description BLOOD LEFT ARM    Special Requests  BOTTLES DRAWN AEROBIC ONLY Blood Culture adequate volume   Culture  Setup Time      AEROBIC BOTTLE ONLY GRAM POSITIVE COCCI CRITICAL VALUE NOTED.  VALUE IS CONSISTENT WITH PREVIOUSLY REPORTED AND CALLED VALUE. Performed at [facility], [place] [address], [place], [place] [address]    Culture GRAM POSITIVE COCCI    Report Status PENDING   Lactic acid, plasma     Status: Abnormal   Collection Time: 01/18/20 11:00 AM  Result Value Ref Range   Lactic Acid, Venous 3.1 (HH) 0.5 - 1.9 mmol/L    Comment: CRITICAL VALUE NOTED.  VALUE IS CONSISTENT WITH PREVIOUSLY REPORTED AND CALLED VALUE. Performed at [facility], [place] [address], [place], [place] [address]   I-STAT 7, (LYTES, BLD GAS, ICA, H+H)     Status: Abnormal   Collection  Time: 01/18/20 11:38 AM  Result Value Ref Range   pH, Arterial 7.431 7.35 - 7.45   pCO2 arterial 41.7 32 - 48 mmHg   pO2, Arterial 51 (L) 83 - 108 mmHg   Bicarbonate 28.1 (H) 20.0 - 28.0 mmol/L   TCO2 29 22 - 32 mmol/L   O2 Saturation 89.0 %   Acid-Base Excess 3.0 (H) 0.0 - 2.0 mmol/L   Sodium 150 (H) 135 - 145 mmol/L   Potassium 4.7 3.5 - 5.1 mmol/L   Calcium, Ion 1.19 1.15 - 1.40 mmol/L   HCT 32.0 (L) 36 - 46 %   Hemoglobin 10.9 (L) 12.0 - 15.0 g/dL   Patient temperature 35.6 C    Sample type ARTERIAL   Glucose, capillary     Status: Abnormal   Collection Time: 01/18/20 11:46 AM  Result Value Ref Range   Glucose-Capillary 295 (H) 70 - 99 mg/dL    Comment: Glucose reference range applies only to samples taken after fasting for at least 8 hours.  Glucose, capillary     Status: Abnormal   Collection Time: 01/18/20  3:57 PM  Result Value Ref Range   Glucose-Capillary 294 (H) 70 - 99 mg/dL    Comment: Glucose reference range applies only to samples taken after fasting for at least 8 hours.  I-STAT 7, (LYTES, BLD GAS, ICA, H+H)     Status: Abnormal   Collection Time: 01/18/20  3:58 PM  Result Value Ref Range   pH, Arterial 7.425 7.35 - 7.45   pCO2 arterial 39.4 32 - 48 mmHg   pO2, Arterial 46 (L) 83 - 108 mmHg   Bicarbonate 26.0 20.0 - 28.0 mmol/L   TCO2 27 22 - 32 mmol/L   O2 Saturation 84.0 %   Acid-Base Excess 1.0 0.0 - 2.0 mmol/L   Sodium 152 (H) 135 - 145 mmol/L   Potassium 4.6 3.5 - 5.1 mmol/L   Calcium, Ion 1.17 1.15 - 1.40 mmol/L   HCT 34.0 (L) 36 - 46 %   Hemoglobin 11.6 (L) 12.0 - 15.0 g/dL   Patient temperature 36.7 C    Sample type ARTERIAL   Basic metabolic panel     Status: Abnormal   Collection Time: 01/18/20  5:00 PM  Result Value Ref Range   Sodium 149 (H) 135 - 145 mmol/L   Potassium 5.4 (H) 3.5 - 5.1 mmol/L   Chloride 113 (H) 98 - 111 mmol/L   CO2 26 22 - 32 mmol/L   Glucose, Bld 336 (H) 70 - 99 mg/dL    Comment: Glucose reference range applies only  to samples taken after fasting for at least  8 hours.   BUN 108 (H) 6 - 20 mg/dL   Creatinine, Ser 1.19 (H) 0.44 - 1.00 mg/dL   Calcium 8.3 (L) 8.9 - 10.3 mg/dL   GFR calc non Af Amer 51 (L) >60 mL/min   GFR calc Af Amer 60 (L) >60 mL/min   Anion gap 10 5 - 15    Comment: Performed at [facility] [address], [place], [place] [address]  Urinalysis, Routine w reflex microscopic     Status: Abnormal   Collection Time: 01/18/20  5:00 PM  Result Value Ref Range   Color, Urine YELLOW YELLOW   APPearance CLOUDY (A) CLEAR   Specific Gravity, Urine 1.016 1.005 - 1.030   pH 5.0 5.0 - 8.0   Glucose, UA NEGATIVE NEGATIVE mg/dL   Hgb urine dipstick SMALL (A) NEGATIVE   Bilirubin Urine NEGATIVE NEGATIVE   Ketones, ur NEGATIVE NEGATIVE mg/dL   Protein, ur NEGATIVE NEGATIVE mg/dL   Nitrite NEGATIVE NEGATIVE   Leukocytes,Ua NEGATIVE NEGATIVE   RBC / HPF 21-50 0 - 5 RBC/hpf   WBC, UA 11-20 0 - 5 WBC/hpf   Bacteria, UA MANY (A) NONE SEEN   Squamous Epithelial / LPF 0-5 0 - 5   Mucus PRESENT    Hyaline Casts, UA PRESENT     Comment: Performed at [facility] [address] [place], [place] [address]  Magnesium     Status: Abnormal   Collection Time: 01/18/20  5:00 PM  Result Value Ref Range   Magnesium 2.7 (H) 1.7 - 2.4 mg/dL    Comment: Performed at [facility] [address], [place], [place] [address]  CBC     Status: Abnormal   Collection Time: 01/18/20  5:00 PM  Result Value Ref Range   WBC 11.4 (H) 4.0 - 10.5 K/uL   RBC 3.81 (L) 3.87 - 5.11 MIL/uL   Hemoglobin 10.9 (L) 12.0 - 15.0 g/dL   HCT 35.5 (L) 36 - 46 %   MCV 93.2 80.0 - 100.0 fL   MCH 28.6 26.0 - 34.0 pg   MCHC 30.7 30.0 - 36.0 g/dL   RDW 15.7 (H) 11.5 - 15.5 %   Platelets 37 (L) 150 - 400 K/uL    Comment: REPEATED TO VERIFY SPECIMEN CHECKED FOR CLOTS Immature Platelet Fraction may be clinically indicated, consider ordering this additional test [id number] CONSISTENT WITH PREVIOUS RESULT    nRBC  1.3 (H) 0.0 - 0.2 %    Comment: Performed at [facility], [place] [address], [place], [place] [address]  APTT     Status: Abnormal   Collection Time: 01/18/20  5:00 PM  Result Value Ref Range   aPTT 63 (H) 24 - 36 seconds    Comment:        IF BASELINE aPTT IS ELEVATED, SUGGEST PATIENT RISK ASSESSMENT BE USED TO DETERMINE APPROPRIATE ANTICOAGULANT THERAPY. Performed at [facility], [place] [address] [place], [place] [address]   Type and screen [place]     Status: None (Preliminary result)   Collection Time: 01/18/20  7:46 PM  Result Value Ref Range   ABO/RH(D) O POS    Antibody Screen NEG    Sample Expiration      01/21/2020,2359 Performed at [facility], [place] [address] [place],  [address]    Unit Number [id number]    Blood Component Type RED CELLS,LR    Unit division 00  Status of Unit ALLOCATED    Transfusion Status OK TO TRANSFUSE    Crossmatch Result Compatible    Unit Number [id number]    Blood Component Type RED CELLS,LR    Unit division 00    Status of Unit ALLOCATED    Transfusion Status OK TO TRANSFUSE    Crossmatch Result Compatible    Unit Number [id number]    Blood Component Type RED CELLS,LR    Unit division 00    Status of Unit ALLOCATED    Transfusion Status OK TO TRANSFUSE    Crossmatch Result Compatible    Unit Number [id number]    Blood Component Type RED CELLS,LR    Unit division 00    Status of Unit ALLOCATED    Transfusion Status OK TO TRANSFUSE    Crossmatch Result Compatible   Glucose, capillary     Status: Abnormal   Collection Time: 01/18/20  7:52 PM  Result Value Ref Range   Glucose-Capillary 287 (H) 70 - 99 mg/dL    Comment: Glucose reference range applies only to samples taken after fasting for at least 8 hours.  I-STAT 7, (LYTES, BLD GAS, ICA, H+H)     Status: Abnormal   Collection Time: 01/18/20  7:53 PM  Result Value Ref Range   pH, Arterial 7.470 (H) 7.35 - 7.45   pCO2  arterial 37.8 32 - 48 mmHg   pO2, Arterial 59 (L) 83 - 108 mmHg   Bicarbonate 27.6 20.0 - 28.0 mmol/L   TCO2 29 22 - 32 mmol/L   O2 Saturation 92.0 %   Acid-Base Excess 4.0 (H) 0.0 - 2.0 mmol/L   Sodium 150 (H) 135 - 145 mmol/L   Potassium 4.2 3.5 - 5.1 mmol/L   Calcium, Ion 1.18 1.15 - 1.40 mmol/L   HCT 31.0 (L) 36 - 46 %   Hemoglobin 10.5 (L) 12.0 - 15.0 g/dL   Patient temperature 36.4 C    Sample type ARTERIAL   I-STAT 7, (LYTES, BLD GAS, ICA, H+H)     Status: Abnormal   Collection Time: 01/18/20  9:47 PM  Result Value Ref Range   pH, Arterial 7.477 (H) 7.35 - 7.45   pCO2 arterial 37.5 32 - 48 mmHg   pO2, Arterial 73 (L) 83 - 108 mmHg   Bicarbonate 28.0 20.0 - 28.0 mmol/L   TCO2 29 22 - 32 mmol/L   O2 Saturation 96.0 %   Acid-Base Excess 4.0 (H) 0.0 - 2.0 mmol/L   Sodium 150 (H) 135 - 145 mmol/L   Potassium 4.2 3.5 - 5.1 mmol/L   Calcium, Ion 1.18 1.15 - 1.40 mmol/L   HCT 31.0 (L) 36 - 46 %   Hemoglobin 10.5 (L) 12.0 - 15.0 g/dL   Patient temperature 36.2 C    Sample type ARTERIAL   Glucose, capillary     Status: Abnormal   Collection Time: 01/18/20 11:10 PM  Result Value Ref Range   Glucose-Capillary 327 (H) 70 - 99 mg/dL    Comment: Glucose reference range applies only to samples taken after fasting for at least 8 hours.  I-STAT 7, (LYTES, BLD GAS, ICA, H+H)     Status: Abnormal   Collection Time: 01/18/20 11:12 PM  Result Value Ref Range   pH, Arterial 7.457 (H) 7.35 - 7.45   pCO2 arterial 38.6 32 - 48 mmHg   pO2, Arterial 72 (L) 83 - 108 mmHg   Bicarbonate 27.5 20.0 - 28.0 mmol/L   TCO2 29 22 - 32 mmol/L  O2 Saturation 96.0 %   Acid-Base Excess 3.0 (H) 0.0 - 2.0 mmol/L   Sodium 150 (H) 135 - 145 mmol/L   Potassium 4.1 3.5 - 5.1 mmol/L   Calcium, Ion 1.15 1.15 - 1.40 mmol/L   HCT 31.0 (L) 36 - 46 %   Hemoglobin 10.5 (L) 12.0 - 15.0 g/dL   Patient temperature 36.0 C    Sample type ARTERIAL   CBC     Status: Abnormal   Collection Time: 01/19/20  3:13 AM   Result Value Ref Range   WBC 13.6 (H) 4.0 - 10.5 K/uL   RBC 3.72 (L) 3.87 - 5.11 MIL/uL   Hemoglobin 10.8 (L) 12.0 - 15.0 g/dL   HCT 34.9 (L) 36 - 46 %   MCV 93.8 80.0 - 100.0 fL   MCH 29.0 26.0 - 34.0 pg   MCHC 30.9 30.0 - 36.0 g/dL   RDW 15.9 (H) 11.5 - 15.5 %   Platelets 26 (LL) 150 - 400 K/uL    Comment: REPEATED TO VERIFY PLATELET COUNT CONFIRMED BY SMEAR Immature Platelet Fraction may be clinically indicated, consider ordering this additional test [id number] THIS CRITICAL RESULT HAS VERIFIED AND BEEN CALLED TO M. TOLER,RN BY TERRAN TYSOR ON 08 13 2021 AT 0444, AND HAS BEEN READ BACK.     nRBC 0.5 (H) 0.0 - 0.2 %    Comment: Performed at [facility], [place] [address], [place], [place] [address]  Basic metabolic panel     Status: Abnormal   Collection Time: 01/19/20  3:13 AM  Result Value Ref Range   Sodium 148 (H) 135 - 145 mmol/L   Potassium 4.5 3.5 - 5.1 mmol/L   Chloride 110 98 - 111 mmol/L   CO2 26 22 - 32 mmol/L   Glucose, Bld 418 (H) 70 - 99 mg/dL    Comment: Glucose reference range applies only to samples taken after fasting for at least 8 hours.   BUN 106 (H) 6 - 20 mg/dL   Creatinine, Ser 1.22 (H) 0.44 - 1.00 mg/dL   Calcium 8.0 (L) 8.9 - 10.3 mg/dL   GFR calc non Af Amer 50 (L) >60 mL/min   GFR calc Af Amer 58 (L) >60 mL/min   Anion gap 12 5 - 15    Comment: Performed at [facility] [address] [place], [place] [address]  Lactate dehydrogenase     Status: Abnormal   Collection Time: 01/19/20  3:13 AM  Result Value Ref Range   LDH 1,110 (H) 98 - 192 U/L    Comment: Performed at [facility], [place] [address], [place], [place] [address]  Protime-INR     Status: Abnormal   Collection Time: 01/19/20  3:13 AM  Result Value Ref Range   Prothrombin Time 23.9 (H) 11.4 - 15.2 seconds   INR 2.2 (H) 0.8 - 1.2    Comment: (NOTE) INR goal varies based on device and disease states. Performed at [facility], [place] [address], [place],  [place] [address]   Fibrinogen     Status: None   Collection Time: 01/19/20  3:13 AM  Result Value Ref Range   Fibrinogen 236 210 - 475 mg/dL    Comment: Performed at [facility] [address] [place], [place] [address]  Hepatic function panel     Status: Abnormal   Collection Time: 01/19/20  3:13 AM  Result Value Ref Range   Total Protein 4.4 (L) 6.5 -  8.1 g/dL   Albumin 2.7 (L) 3.5 - 5.0 g/dL   AST 147 (H) 15 - 41 U/L   ALT 108 (H) 0 - 44 U/L   Alkaline Phosphatase 214 (H) 38 - 126 U/L   Total Bilirubin 2.0 (H) 0.3 - 1.2 mg/dL   Bilirubin, Direct 0.5 (H) 0.0 - 0.2 mg/dL   Indirect Bilirubin 1.5 (H) 0.3 - 0.9 mg/dL    Comment: Performed at [facility] [address], [place], [place] [address]  APTT     Status: Abnormal   Collection Time: 01/19/20  3:13 AM  Result Value Ref Range   aPTT 52 (H) 24 - 36 seconds    Comment:        IF BASELINE aPTT IS ELEVATED, SUGGEST PATIENT RISK ASSESSMENT BE USED TO DETERMINE APPROPRIATE ANTICOAGULANT THERAPY. Performed at [facility], [place] [address], [place], [place] [address]   Lactic acid, plasma     Status: Abnormal   Collection Time: 01/19/20  3:13 AM  Result Value Ref Range   Lactic Acid, Venous 2.1 (HH) 0.5 - 1.9 mmol/L    Comment: CRITICAL VALUE NOTED.  VALUE IS CONSISTENT WITH PREVIOUSLY REPORTED AND CALLED VALUE. Performed at [facility], [place] [address], [place], [place] [address]   Glucose, capillary     Status: Abnormal   Collection Time: 01/19/20  3:18 AM  Result Value Ref Range   Glucose-Capillary 397 (H) 70 - 99 mg/dL    Comment: Glucose reference range applies only to samples taken after fasting for at least 8 hours.  I-STAT 7, (LYTES, BLD GAS, ICA, H+H)     Status: Abnormal   Collection Time: 01/19/20  5:08 AM  Result Value Ref Range   pH, Arterial 7.413 7.35 - 7.45   pCO2 arterial 42.6 32 - 48 mmHg   pO2, Arterial 54 (L) 83 - 108 mmHg   Bicarbonate 27.2 20.0 - 28.0 mmol/L   TCO2 28 22 - 32  mmol/L   O2 Saturation 88.0 %   Acid-Base Excess 2.0 0.0 - 2.0 mmol/L   Sodium 149 (H) 135 - 145 mmol/L   Potassium 4.2 3.5 - 5.1 mmol/L   Calcium, Ion 1.17 1.15 - 1.40 mmol/L   HCT 30.0 (L) 36 - 46 %   Hemoglobin 10.2 (L) 12.0 - 15.0 g/dL   Sample type ARTERIAL   I-STAT 7, (LYTES, BLD GAS, ICA, H+H)     Status: Abnormal   Collection Time: 01/19/20  8:31 AM  Result Value Ref Range   pH, Arterial 7.431 7.35 - 7.45   pCO2 arterial 42.9 32 - 48 mmHg   pO2, Arterial 53 (L) 83 - 108 mmHg   Bicarbonate 28.7 (H) 20.0 - 28.0 mmol/L   TCO2 30 22 - 32 mmol/L   O2 Saturation 88.0 %   Acid-Base Excess 4.0 (H) 0.0 - 2.0 mmol/L   Sodium 148 (H) 135 - 145 mmol/L   Potassium 4.2 3.5 - 5.1 mmol/L   Calcium, Ion 1.15 1.15 - 1.40 mmol/L   HCT 29.0 (L) 36 - 46 %   Hemoglobin 9.9 (L) 12.0 - 15.0 g/dL   Patient temperature 36.7 C    Sample type ARTERIAL   Lactate dehydrogenase     Status: Abnormal   Collection Time: 01/19/20  8:42 AM  Result Value Ref Range   LDH 1,103 (H) 98 - 192 U/L    Comment: Performed at [facility], [place] [address] [place], [place] [address]  Basic metabolic panel     Status: Abnormal   Collection Time: 01/19/20  8:42 AM  Result Value Ref Range   Sodium 149 (H) 135 - 145 mmol/L   Potassium 4.4 3.5 - 5.1 mmol/L   Chloride 112 (H) 98 - 111 mmol/L   CO2 27 22 - 32 mmol/L   Glucose, Bld 402 (H) 70 - 99 mg/dL    Comment: Glucose reference range applies only to samples taken after fasting for at least 8 hours.   BUN 105 (H) 6 - 20 mg/dL   Creatinine, Ser 1.25 (H) 0.44 - 1.00 mg/dL   Calcium 8.0 (L) 8.9 - 10.3 mg/dL   GFR calc non Af Amer 48 (L) >60 mL/min   GFR calc Af Amer 56 (L) >60 mL/min   Anion gap 10 5 - 15    Comment: Performed at [facility] [address] [place], [place] [address]  CBC     Status: Abnormal   Collection Time: 01/19/20  8:42 AM  Result Value Ref Range   WBC 10.8 (H) 4.0 - 10.5 K/uL   RBC 3.53 (L) 3.87 - 5.11 MIL/uL   Hemoglobin  10.3 (L) 12.0 - 15.0 g/dL   HCT 33.2 (L) 36 - 46 %   MCV 94.1 80.0 - 100.0 fL   MCH 29.2 26.0 - 34.0 pg   MCHC 31.0 30.0 - 36.0 g/dL   RDW 15.9 (H) 11.5 - 15.5 %   Platelets 27 (LL) 150 - 400 K/uL    Comment: REPEATED TO VERIFY Immature Platelet Fraction may be clinically indicated, consider ordering this additional test [id number] CRITICAL VALUE NOTED.  VALUE IS CONSISTENT WITH PREVIOUSLY REPORTED AND CALLED VALUE.    nRBC 0.5 (H) 0.0 - 0.2 %    Comment: Performed at [facility], [place] [address], [place], [place] [address]  I-STAT 7, (LYTES, BLD GAS, ICA, H+H)     Status: Abnormal   Collection Time: 01/19/20  8:44 AM  Result Value Ref Range   pH, Arterial 7.438 7.35 - 7.45   pCO2 arterial 43.5 32 - 48 mmHg   pO2, Arterial 442 (H) 83 - 108 mmHg   Bicarbonate 29.5 (H) 20.0 - 28.0 mmol/L   TCO2 31 22 - 32 mmol/L   O2 Saturation 100.0 %   Acid-Base Excess 5.0 (H) 0.0 - 2.0 mmol/L   Sodium 148 (H) 135 - 145 mmol/L   Potassium 4.2 3.5 - 5.1 mmol/L   Calcium, Ion 1.13 (L) 1.15 - 1.40 mmol/L   HCT 30.0 (L) 36 - 46 %   Hemoglobin 10.2 (L) 12.0 - 15.0 g/dL   Patient temperature 36.6 C    Collection site Pedal    Drawn by RT    Sample type ECMO Circuit   Glucose, capillary     Status: Abnormal   Collection Time: 01/19/20  8:52 AM  Result Value Ref Range   Glucose-Capillary 363 (H) 70 - 99 mg/dL    Comment: Glucose reference range applies only to samples taken after fasting for at least 8 hours.  Glucose, capillary     Status: Abnormal   Collection Time: 01/19/20 10:27 AM  Result Value Ref Range   Glucose-Capillary 372 (H) 70 - 99 mg/dL    Comment: Glucose reference range applies only to samples taken after fasting for at least 8 hours.  Glucose, capillary     Status: Abnormal   Collection Time: 01/19/20 11:27 AM  Result Value Ref Range   Glucose-Capillary  396 (H) 70 - 99 mg/dL    Comment: Glucose reference range applies only to samples taken after fasting for at least 8  hours.  Glucose, capillary     Status: Abnormal   Collection Time: 01/19/20 12:45 PM  Result Value Ref Range   Glucose-Capillary 359 (H) 70 - 99 mg/dL    Comment: Glucose reference range applies only to samples taken after fasting for at least 8 hours.  Glucose, capillary     Status: Abnormal   Collection Time: 01/19/20  2:54 PM  Result Value Ref Range   Glucose-Capillary 287 (H) 70 - 99 mg/dL    Comment: Glucose reference range applies only to samples taken after fasting for at least 8 hours.  I-STAT 7, (LYTES, BLD GAS, ICA, H+H)     Status: Abnormal   Collection Time: 01/19/20  2:59 PM  Result Value Ref Range   pH, Arterial 7.425 7.35 - 7.45   pCO2 arterial 40.2 32 - 48 mmHg   pO2, Arterial 69 (L) 83 - 108 mmHg   Bicarbonate 26.7 20.0 - 28.0 mmol/L   TCO2 28 22 - 32 mmol/L   O2 Saturation 95.0 %   Acid-Base Excess 2.0 0.0 - 2.0 mmol/L   Sodium 147 (H) 135 - 145 mmol/L   Potassium 4.1 3.5 - 5.1 mmol/L   Calcium, Ion 1.19 1.15 - 1.40 mmol/L   HCT 28.0 (L) 36 - 46 %   Hemoglobin 9.5 (L) 12.0 - 15.0 g/dL   Patient temperature 35.5 C    Collection site art line    Drawn by Operator    Sample type ARTERIAL   CBC     Status: Abnormal   Collection Time: 01/19/20  4:55 PM  Result Value Ref Range   WBC 8.4 4.0 - 10.5 K/uL   RBC 3.42 (L) 3.87 - 5.11 MIL/uL   Hemoglobin 9.8 (L) 12.0 - 15.0 g/dL   HCT 31.7 (L) 36 - 46 %   MCV 92.7 80.0 - 100.0 fL   MCH 28.7 26.0 - 34.0 pg   MCHC 30.9 30.0 - 36.0 g/dL   RDW 15.9 (H) 11.5 - 15.5 %   Platelets 24 (LL) 150 - 400 K/uL    Comment: REPEATED TO VERIFY SPECIMEN CHECKED FOR CLOTS Immature Platelet Fraction may be clinically indicated, consider ordering this additional test [id number] CRITICAL VALUE NOTED.  VALUE IS CONSISTENT WITH PREVIOUSLY REPORTED AND CALLED VALUE.    nRBC 0.2 0.0 - 0.2 %    Comment: Performed at [facility], [place] [address] [place], [place] [address]  Basic metabolic panel     Status: Abnormal   Collection  Time: 01/19/20  4:55 PM  Result Value Ref Range   Sodium 146 (H) 135 - 145 mmol/L   Potassium 4.1 3.5 - 5.1 mmol/L   Chloride 109 98 - 111 mmol/L   CO2 26 22 - 32 mmol/L   Glucose, Bld 241 (H) 70 - 99 mg/dL    Comment: Glucose reference range applies only to samples taken after fasting for at least 8 hours.   BUN 99 (H) 6 - 20 mg/dL   Creatinine, Ser 1.12 (H) 0.44 - 1.00 mg/dL   Calcium 7.8 (L) 8.9 - 10.3 mg/dL   GFR calc non Af Amer 55 (L) >60 mL/min   GFR calc Af Amer >60 >60 mL/min   Anion gap 11 5 - 15    Comment: Performed at [facility] [address], [place], [place] [address]  Glucose, capillary     Status: Abnormal   Collection Time: 01/19/20  5:01 PM  Result Value Ref Range   Glucose-Capillary 239 (H) 70 - 99 mg/dL    Comment: Glucose reference range applies only to samples taken after fasting for at least 8 hours.    MICRO:  IMAGING: CT HEAD WO CONTRAST  Result Date: 01/19/2020 CLINICAL DATA:  Mental status change, COVID positive, on ECMO EXAM: CT HEAD WITHOUT CONTRAST TECHNIQUE: Contiguous axial images were obtained from the base of the skull through the vertex without intravenous contrast. COMPARISON:  01/06/2020 FINDINGS: Brain: Multifocal areas of parenchymal hemorrhage identified bilaterally with greatest involvement of the parietal lobes. The largest area the right parietal lobe measures approximately 3.8 x 2.3 cm. There is edema associated with these hemorrhages causing mild regional mass effect. No intraventricular extension. No hydrocephalus. Gray-white differentiation is preserved. Vascular: No hyperdense vessel or unexpected calcification. Skull: Calvarium is unremarkable. Sinuses/Orbits: Nonspecific extensive paranasal sinus opacification. Other: Nonspecific mastoid and middle ear opacification. Partially imaged endotracheal and enteric tubes. IMPRESSION: Multifocal parenchymal hemorrhages, with the largest within the right parietal lobe. Together with  associated edema, there is mild regional mass effect but no herniation. Considerations include sequelae of ECMO anticoagulation, hemorrhagic conversion of embolic infarcts, and vasculitis/vasculopathy. These results were called by telephone at the time of interpretation on 01/19/2020 at 2:41 pm to provider Noemi Chapel , who verbally acknowledged these results. Electronically Signed   By: Macy Mis M.D.   On: 01/19/2020 14:45   CT CHEST ABDOMEN PELVIS W CONTRAST  Result Date: 01/19/2020 CLINICAL DATA:  Sepsis. Leukocytosis. Respiratory failure. COVID ARDS. EXAM: CT CHEST, ABDOMEN, AND PELVIS WITH CONTRAST TECHNIQUE: Multidetector CT imaging of the chest, abdomen and pelvis was performed following the standard protocol during bolus administration of intravenous contrast. CONTRAST:  118m OMNIPAQUE IOHEXOL 350 MG/ML SOLN COMPARISON:  Abdomen and pelvis CT dated 05/10/2014, portable chest obtained earlier today. FINDINGS: CT CHEST FINDINGS Cardiovascular: The right jugular ECMO catheter extends through the superior vena cava and into the inferior vena cava with its tip above the level of the renal veins. Mildly enlarged heart. Minimal pericardial fluid with a maximum thickness of 5 mm. Mediastinum/Nodes: Endotracheal tube tip 1 cm above the carina. Feeding tube extending into the stomach. Unremarkable included thyroid gland. No enlarged lymph nodes. Lungs/Pleura: Dense airspace opacity throughout both lungs with air bronchograms. Small to moderate-sized right pleural effusion and small left pleural effusion. Musculoskeletal: Thoracic spine degenerative changes. Lower cervical spine fixation hardware. CT ABDOMEN PELVIS FINDINGS Hepatobiliary: No focal liver abnormality is seen. Status post cholecystectomy. No biliary dilatation. Pancreas: Unremarkable. No pancreatic ductal dilatation or surrounding inflammatory changes. Spleen: Normal in size without focal abnormality. Adrenals/Urinary Tract: Foley catheter in  the urinary bladder with no urine in the bladder. There is associated air in the bladder. Normal appearing adrenal glands, kidneys and ureters. Stomach/Bowel: Large number of sigmoid and distal descending colon diverticula without evidence of diverticulitis. Normal appearing appendix, small bowel and stomach. Feeding tube tip in the mid to distal stomach. Rectal balloon catheter. Vascular/Lymphatic: No significant vascular findings are present. No enlarged abdominal or pelvic lymph nodes. Reproductive: Status post hysterectomy. No adnexal masses. Other: Mild bilateral subcutaneous edema. Small amount of free peritoneal fluid. Musculoskeletal: Lumbar spine degenerative changes. IMPRESSION: 1. Dense airspace opacity throughout both lungs with air bronchograms, compatible with the clinical diagnosis of COVID ARDS. Dense bilateral pneumonia could also have this appearance. 2. Small to moderate-sized right pleural effusion and small left pleural  effusion. 3. Small amount of ascites. 4. Colonic diverticulosis. Electronically Signed   By: Claudie Revering M.D.   On: 01/19/2020 15:23   DG CHEST PORT 1 VIEW  Result Date: 01/19/2020 CLINICAL DATA:  Hypoxia EXAM: PORTABLE CHEST 1 VIEW COMPARISON:  January 18, 2020 FINDINGS: Endotracheal tube tip is 2.8 cm above the carina. Feeding tube tip is below the diaphragm. Central catheter tips are in the superior vena cava. ECMO catheter extends below the diaphragm on the right. No pneumothorax. There is widespread airspace opacity bilaterally with underlying pleural effusions. Heart is prominent, stable. Pulmonary vascularity largely obscured by overlying airspace opacity. Postoperative change noted in the lower cervical region. IMPRESSION: Persistent widespread airspace opacity with underlying pleural effusions. Tube and catheter positions as described without pneumothorax. Stable cardiac silhouette. Electronically Signed   By: Lowella Grip III M.D.   On: 01/19/2020 08:20   DG  CHEST PORT 1 VIEW  Result Date: 01/18/2020 CLINICAL DATA:  COVID-19 positivity, ECMO EXAM: PORTABLE CHEST 1 VIEW COMPARISON:  01/17/2020 FINDINGS: Endotracheal tube, feeding catheter, left jugular central line and right-sided PICC line are again identified and stable. Large ECMO cannula is noted in the right neck. Cardiac shadow is stable. Slight improved aeration is noted in both lungs although persistent opacities remain consistent with the given clinical history. No bony abnormality is noted. IMPRESSION: Slight improved aeration bilaterally. Tubes and lines as described. Electronically Signed   By: Inez Catalina M.D.   On: 01/18/2020 08:18   US Abdomen Limited RUQ  Result Date: 01/18/2020 CLINICAL DATA:  Hyperbilirubinemia.  History of cholecystectomy. EXAM: ULTRASOUND ABDOMEN LIMITED RIGHT UPPER QUADRANT COMPARISON:  None. FINDINGS: Gallbladder: Surgically absent. Common bile duct: Diameter: 3 mm Liver: Diffusely echogenic indicating fatty infiltration. No focal mass or lesion is identified within the liver. Portal vein is patent on color Doppler imaging with normal direction of blood flow towards the liver. Other: None. IMPRESSION: 1. No acute findings. 2. No bile duct dilatation. 3. Fatty infiltration of the liver. 4. Status post cholecystectomy. Electronically Signed   By: Franki Cabot M.D.   On: 01/18/2020 16:29   Assessment/Plan:  54yo F with severe covid disease and critical illness on ECMO with enterococcal bacteremia of unclear source. 20% of critical ill covid patients have been found to develop hospital onset infections. Her lines appears appropriately dressed per nursing protocol. She had constipation and given laxatives but unclear if this is translocation from gi source  Recommend to continue with piptazo for bacteremia and vancomycin for pulmonary coverage -- while on ECMO  Overall prognosis is guarded given comorbidities and new infections.

## 2020-01-19 NOTE — Progress Notes (Signed)
PHARMACY - PHYSICIAN COMMUNICATION CRITICAL VALUE ALERT - BLOOD CULTURE IDENTIFICATION (BCID)  Alisha Stephenson is an 54 y.o. female who presented to [facility] on 02/04/2020 with a chief complaint of COVID PNA, now with VRDF on ECMO    Assessment:  2/2 blood cultures growing Enterococcus faecalis  Name of physician (or Provider) Contacted:  Dr. Lucile Shutters  Current antibiotics: Vancomycin and Meropenem  Changes to prescribed antibiotics recommended:   Consider narrowing to Ampicillin 2 g IV q4h   Results for orders placed or performed during the hospital encounter of 01/12/2020  Blood Culture ID Panel (Reflexed) (Collected: 01/18/2020 10:17 AM)  Result Value Ref Range   Enterococcus faecalis DETECTED (A) NOT DETECTED   Enterococcus Faecium NOT DETECTED NOT DETECTED   Listeria monocytogenes NOT DETECTED NOT DETECTED   Staphylococcus species NOT DETECTED NOT DETECTED   Staphylococcus aureus (BCID) NOT DETECTED NOT DETECTED   Staphylococcus epidermidis NOT DETECTED NOT DETECTED   Staphylococcus lugdunensis NOT DETECTED NOT DETECTED   Streptococcus species NOT DETECTED NOT DETECTED   Streptococcus agalactiae NOT DETECTED NOT DETECTED   Streptococcus pneumoniae NOT DETECTED NOT DETECTED   Streptococcus pyogenes NOT DETECTED NOT DETECTED   A.calcoaceticus-baumannii NOT DETECTED NOT DETECTED   Bacteroides fragilis NOT DETECTED NOT DETECTED   Enterobacterales NOT DETECTED NOT DETECTED   Enterobacter cloacae complex NOT DETECTED NOT DETECTED   Escherichia coli NOT DETECTED NOT DETECTED   Klebsiella aerogenes NOT DETECTED NOT DETECTED   Klebsiella oxytoca NOT DETECTED NOT DETECTED   Klebsiella pneumoniae NOT DETECTED NOT DETECTED   Proteus species NOT DETECTED NOT DETECTED   Salmonella species NOT DETECTED NOT DETECTED   Serratia marcescens NOT DETECTED NOT DETECTED   Haemophilus influenzae NOT DETECTED NOT DETECTED   Neisseria meningitidis NOT DETECTED NOT DETECTED   Pseudomonas aeruginosa  NOT DETECTED NOT DETECTED   Stenotrophomonas maltophilia NOT DETECTED NOT DETECTED   Candida albicans NOT DETECTED NOT DETECTED   Candida auris NOT DETECTED NOT DETECTED   Candida glabrata NOT DETECTED NOT DETECTED   Candida krusei NOT DETECTED NOT DETECTED   Candida parapsilosis NOT DETECTED NOT DETECTED   Candida tropicalis NOT DETECTED NOT DETECTED   Cryptococcus neoformans/gattii NOT DETECTED NOT DETECTED   Vancomycin resistance NOT DETECTED NOT DETECTED    Caryl Pina 01/19/2020  12:47 AM

## 2020-01-19 NOTE — Progress Notes (Signed)
  Patient sent for CT scan of the brain due to persistent encephalopathy.   CT scan reviewed. Shows multiple areas of parnenchymal hemorrhage possibly due to septic or embolic phenomenon versus spontaneous bleeding due to [facility] and low platelets.  I discussed with Dr. Erlinda Hong in Neurology and we agree that with all of her comorbidities that this likely represents a Palliative situation.   I spoke with family and updated them on multiple issues including persistent encephalopathy, PNA, sepsis and ICH. I informed them that her prognosis is quite poor and would suggest switch to comfort care. They agree that they would not want her to suffer any more. I will call Palliative Care consult to help establish goals of care and withdrawal of ECMO support as well as trying to coordinate a family visit if possible.   Total  Additional CCT 60 mins.   Glori Bickers, MD  4:25 PM

## 2020-01-19 NOTE — Progress Notes (Signed)
CRITICAL VALUE ALERT  Critical Value:  Platelets 26  Date & Time Notied:  0402  Provider Notified: Dr [name]  Orders Received/Actions taken: MD aware, per Dr [name] will hold on giving platelets at this time.

## 2020-01-19 NOTE — Progress Notes (Signed)
ANTICOAGULATION & Antibiotic CONSULT NOTE   Pharmacy Consult for bivalirudin + Vancomycin + Zosyn Indication: VV ECMO, Sepsis  Allergies  Allergen Reactions  . Sulfa Antibiotics Rash and Other (See Comments)    Patient Measurements: Height: 5\' 4"  (162.6 cm) Weight: (!) 138.4 kg (305 lb 1.9 oz) IBW/kg (Calculated) : 54.7 Heparin Dosing Weight: 87kg  Vital Signs: Temp: 97.2 F (36.2 C) (08/13 1000) Temp Source: Bladder (08/13 0400) BP: 129/57 (08/13 0828) Pulse Rate: 68 (08/13 1000)  Labs: Recent Labs    01/17/20 0351 01/17/20 0352 01/18/20 0355 01/18/20 0403 01/18/20 1700 01/18/20 1953 01/19/20 0313 01/19/20 0508 01/19/20 0831 01/19/20 0831 01/19/20 0842 01/19/20 0844  HGB 10.2*   < > 10.8*   < > 10.9*   < > 10.8*   < > 9.9*   < > 10.3* 10.2*  HCT 33.3*   < > 35.0*   < > 35.5*   < > 34.9*   < > 29.0*  --  33.2* 30.0*  PLT 91*   < > 63*   < > 37*  --  26*  --   --   --  27*  --   APTT 96*   < > 51*  --  63*  --  52*  --   --   --   --   --   LABPROT 23.2*  --  22.3*  --   --   --  23.9*  --   --   --   --   --   INR 2.1*  --  2.0*  --   --   --  2.2*  --   --   --   --   --   CREATININE 1.17*   < > 1.23*   < > 1.19*  --  1.22*  --   --   --  1.25*  --    < > = values in this interval not displayed.    Estimated Creatinine Clearance: 70.8 mL/min (A) (by C-G formula based on SCr of 1.25 mg/dL (H)).   Medical History: Past Medical History:  Diagnosis Date  . Asthma   . Diabetes mellitus without complication ([place])   . Diverticulitis   . Gallstones   . IBS (irritable bowel syndrome)   . NAFLD (nonalcoholic fatty liver disease)    CT scan 2015  . Ovarian cyst     Assessment: 54 year old female admitted with COVID s/p treatment with antivirals completed 8/6. Now with continued low saturations and decision was made to transfer to [facility] for ECMO cannulation and continue with VV ECMO.   Anticoagulation: APTT down to lower end of therapeutic at 52 sec on bival  0.075 mg/kg/hr. Hgb stable 10.3, but platelets down to 63>37>26 from yesterday. LDH very elevated at 1110 from 623 yesterday, however pt now septic which may be contributing. Fibrinogen stable in 200s. No overt bleeding.  Antibiotics: Patient empirically started on vancomycin and meropenem on 8/13. Now growing enterococcus faecalis in blood culture in addition to abundant staph aureus in trach aspirate. Pharmacy asked to continue vancomycin and switch meropenem to Zosyn for better enterococcus coverage. Given Zosyn pharmacokinetics in ECMO, will give higher dose. WBC 10.8, afebrile, LA 2.1, Scr 1.25, BUN elevated but stable 105, making urine on lasix gtt. Plts low prior to starting Zosyn, will watch closely.    Goal of Therapy:  aPTT 50-80 seconds Monitor platelets by anticoagulation protocol: Yes  Vancomycin trough 15-20   Plan:  Continue bivalirudin at 0.075 mg/kg/hr Check aPTT at 0500/1700 Monitor daily aPTT, CBC, s/sx bleeding  Continue Vancomycin 1250 mg IV Q12 hrs Start Zosyn 4.5g IV Q6 hrs Monitor renal function, cultures/sensitivities, clinical progression Check vancomycin trough tomorrow  Richardine Service, PharmD PGY2 Cardiology Pharmacy Resident Phone: [phone] 01/19/2020  11:45 AM  Please check [email or web address] for unit-specific pharmacy phone numbers.

## 2020-01-19 NOTE — Progress Notes (Signed)
NAME:  Alisha Stephenson, MRN:  [id number], DOB:  [birth year], LOS: 65 ADMISSION DATE:  01/27/2020, CONSULTATION DATE:  8/3 REFERRING MD:  Alfredia Ferguson, CHIEF COMPLAINT:  Dyspnea   Brief History   54 y/o female admitted on 8/2 with severe acute respiratory failure with hypoxemia due to COVID 19 pneumonia.  She developed symptoms 1 week prior to admission.  Past Medical History  DM2 Diverticulitis Gallstones Ovarian cyst NAFLD Asthma  Significant Hospital Events   8/2 admit 8/3 ICU transfer, intubated 8/4 prone, paralyze 8/9 significant desaturations today 8/9 VV ECMO cannulation  Consults:  PCCM ECMO team    Procedures:  8/3 ETT >  8/3 PICC >  8/9 RIJ [place] 13F   Significant Diagnostic Tests:  7/31 CT head > NAICP 7/31 MRI/MRA brain > no acute changes, possibly small aneurysm ACOM  Micro Data:  8/2 blood > NG 8/2 SARS COV 2 > positive 8/4 resp > negative 8/4 urine >   Antimicrobials:  8/2 remdesivir > 8/6 8/2 actemra  8/2 solumedrol >   8/3 ceftriaxone >  8/5 8/3 azithro >  8/5  8/12 meropenem> 8/12 vanc>  Interim history/subjective:  Lasix gtt with response. Had an episode of tachypnea with decreased sedation causing desaturations requiring increased sedation. Not following commands.  Objective   Blood pressure (!) 114/51, pulse 85, temperature 97.7 F (36.5 C), resp. rate (!) 0, height 5\' 4"  (1.626 m), weight (!) 138.4 kg, SpO2 (!) 86 %. CVP:  [4 mmHg-23 mmHg] 14 mmHg  Vent Mode: PCV FiO2 (%):  [60 %] 60 % Set Rate:  [10 bmp] 10 bmp PEEP:  [14 cmH20] 14 cmH20 Plateau Pressure:  [26 cmH20] 26 cmH20   Intake/Output Summary (Last 24 hours) at 01/19/2020 8341 Last data filed at 01/19/2020 0700 Gross per 24 hour  Intake 5530.13 ml  Output 2785 ml  Net 2745.13 ml   Filed Weights   01/17/20 0600 01/18/20 0600 01/19/20 0500  Weight: 135.3 kg (!) 136.9 kg (!) 138.4 kg    Examination:  General: critically ill appearing woman laying in bed intubated,  sedated, on ecmo HENT: [place]/AT, eyes anicteric. ETT in place.  PULM: rhales bilaterally, blood-tinged purulent secretions from ETT CV: RRR, no murmurs GI: obese, soft, minimally distended. Flexiseal in place. Extremities: ++ edema in all extremities, warm, no cyanosis Neuro: RASS -5, PERRL, breathing above the vent. No withdrawal from painful stimulation. + cough reflex.  Vent PC 10/ PS 12 + PEEP 14/ 60% with Vt ~240cc P plat~25, but breathing over (peak 31)   Circuit: 3400RPM, 4L/min, 8L sweep flow  CXR personally reviewed- persistent bilateral opacities with minimal improving aeration, ECMO cannula in place.  Resolved Hospital Problem list     Assessment & Plan:   ARDS due to COVID 19 pneumonia > worsening oxygenation again on 8/6, bilateral effusions, presumed acute pulmonary edema VV ECMO cannulation on 8/9 for refractory hypoxemia; now with persistent hypoxemia despite ECMO, worsened in the past 24 hrs. -con't lung protective ventilation- PEEP 14, PS 12. Ideally titrate down FiO2 to 50% with increase flows -lasix drip for goal net negative volume status; intolerant to PRN dosing 2/2 chugging. May need dialysis for volume removal. -off NMB -Continue Solu-Medrol per protocol; previously completed remdesivir per protocol.  Tocilizumab given on 8/2. -decrease sedation as tolerated; goal RASS -1 -VAP prevention protocol -bivalirudin for AC on ECMO; goal PTT 50-80  Lactic acidosis 2/2 sepsis; hypoxia corrected -con't to monitor per ECMO protocol -if persistently elevated, restart empiric abx- meropenem,  vanc -RUQ [place] -pan-culture  Enterococcus bacteremia and likely pneumonia -broad-spectrum antibiotics until sensitivities available -repeat blood cultures today -may need to remove lines if persistently bacteremic  Need for sedation for mechanical ventilation Ventilator dyssynchrony  -wean sedation; off NMB now -Continue fentanyl/versed infusions + orals oxycodone and  clonazepam -try to limit versed infusion as much as possible  AKI; severe azotemia -Continue to monitor daily -Goal of euvolemia-- diuresis, although remaining net positive 2/2 chugging limiting increased rate of diuresis -Electrolyte repletion as needed  Elevated LDH and thrombocytopenia; concerned for consumption in ECMO circuit vs induced by sepsis. Hb stable. -repeat CBC & LDH; may need to increase circuit AC -team to decide on platelet transfusion after repeat labs  Hyperglycemia > not controlled despite >200 units insulin past 24 hrs -Goal BG 140-180 while admitted to the ICU -con't levemir. Increase novolog TF coverage again. SSI PRN -give long-acting insulin in divided doses to maximize absorption  Constipation, resolved  Hypernatremia, worsening -free water repletion 300cc Q2h -con't to monitor -avoid NS due to chloride content  Acute anemia due to critical illness -transfuse for Hb <8 -con't to monitor  Hyperbilirubinemia; stable Hb- likely 2/2 sepsis. No evidence of CBD obstruction; surgically absent GB. -con't to monitor  Encephalopathy- septic vs due to meds vs azotemia. Very concerned for hypoxic brain injury given her severe hypoxia during this admission 2/2 her ARDS. -head CT today  Best practice:  Diet: tube feeding Pain/Anxiety/Delirium protocol (if indicated): as above VAP protocol (if indicated): yes DVT prophylaxis: bivalirudin GI prophylaxis: famotidine Glucose control: Basal bolus insulin Mobility: bed rest Code Status: full Family Communication: husband Vernon  Disposition: ICU  Labs   CBC: Recent Labs  Lab 01/13/20 0500 01/18/2020 0356 01/17/20 0351 01/17/20 0352 01/17/20 1700 01/17/20 2008 01/18/20 0355 01/18/20 0403 01/18/20 1700 01/18/20 1953 01/18/20 2312 01/19/20 0313 01/19/20 0508 01/19/20 0831 01/19/20 0844  WBC 6.2   < > 7.3  --  7.7  --  9.0  --  11.4*  --   --  13.6*  --   --   --   NEUTROABS 4.9  --   --   --   --    --   --   --   --   --   --   --   --   --   --   HGB 12.8   < > 10.2*   < > 10.8*   < > 10.8*   < > 10.9*   < > 10.5* 10.8* 10.2* 9.9* 10.2*  HCT 42.4   < > 33.3*   < > 35.2*   < > 35.0*   < > 35.5*   < > 31.0* 34.9* 30.0* 29.0* 30.0*  MCV 94.2   < > 94.6  --  94.1  --  92.8  --  93.2  --   --  93.8  --   --   --   PLT 132*   < > 91*  --  92*  --  63*  --  37*  --   --  26*  --   --   --    < > = values in this interval not displayed.    Basic Metabolic Panel: Recent Labs  Lab 01/16/20 0343 01/16/20 0558 01/16/20 1100 01/16/20 1159 01/17/20 0351 01/17/20 0352 01/17/20 1700 01/17/20 2008 01/18/20 0355 01/18/20 0403 01/18/20 1700 01/18/20 1953 01/18/20 2312 01/19/20 0313 01/19/20 0508 01/19/20 0831 01/19/20 0844  NA 155*   < >  154*   < > 149*   < > 150*   < > 153*   < > 149*   < > 150* 148* 149* 148* 148*  K 4.3   < > 5.7*   < > 4.0   < > 5.0   < > 3.8   < > 5.4*   < > 4.1 4.5 4.2 4.2 4.2  CL 112*   < > 113*   < > 111  --  112*  --  114*  --  113*  --   --  110  --   --   --   CO2 35*   < > 34*   < > 29  --  30  --  28  --  26  --   --  26  --   --   --   GLUCOSE 168*   < > 181*   < > 332*  --  337*  --  259*  --  336*  --   --  418*  --   --   --   BUN 72*   < > 84*   < > 99*  --  97*  --  102*  --  108*  --   --  106*  --   --   --   CREATININE 0.74   < > 1.07*   < > 1.17*  --  1.03*  --  1.23*  --  1.19*  --   --  1.22*  --   --   --   CALCIUM 8.4*   < > 8.1*   < > 8.1*  --  8.4*  --  8.7*  --  8.3*  --   --  8.0*  --   --   --   MG 3.1*  --  2.9*  --   --   --   --   --   --   --  2.7*  --   --   --   --   --   --   PHOS 2.3*  --  4.7*  --   --   --   --   --   --   --   --   --   --   --   --   --   --    < > = values in this interval not displayed.   GFR: Estimated Creatinine Clearance: 72.5 mL/min (A) (by C-G formula based on SCr of 1.22 mg/dL (H)). Recent Labs  Lab 01/17/20 1700 01/17/20 2006 01/18/20 0355 01/18/20 0839 01/18/20 1100 01/18/20 1700  01/19/20 0313  WBC 7.7  --  9.0  --   --  11.4* 13.6*  LATICACIDVEN  --    < > 3.3* 3.4* 3.1*  --  2.1*   < > = values in this interval not displayed.    Liver Function Tests: Recent Labs  Lab 01/16/20 0343 01/16/20 0356 01/17/20 0351 01/18/20 0355 01/19/20 0313  AST 54* 54* 58* 50* 147*  ALT 64* 64* 59* 54* 108*  ALKPHOS 81 80 92 342* 214*  BILITOT 0.4 0.6 1.2 1.3* 2.0*  PROT 4.5* 4.6* 4.5* 4.7* 4.4*  ALBUMIN 2.6* 2.7* 3.0* 3.1* 2.7*   No results for input(s): LIPASE, AMYLASE in the last 168 hours. No results for input(s): AMMONIA in the last 168 hours.  ABG    Component Value Date/Time   PHART 7.438 01/19/2020 0844  PCO2ART 43.5 01/19/2020 0844   PO2ART 442 (H) 01/19/2020 0844   HCO3 29.5 (H) 01/19/2020 0844   TCO2 31 01/19/2020 0844   ACIDBASEDEF 2.0 01/11/2020 1203   O2SAT 100.0 01/19/2020 0844     Coagulation Profile: Recent Labs  Lab 01/14/2020 2035 01/16/20 0356 01/17/20 0351 01/18/20 0355 01/19/20 0313  INR 1.5* 1.6* 2.1* 2.0* 2.2*    Cardiac Enzymes: No results for input(s): CKTOTAL, CKMB, CKMBINDEX, TROPONINI in the last 168 hours.  HbA1C: Hgb A1c MFr Bld  Date/Time Value Ref Range Status  01/14/2020 03:56 AM 10.7 (H) 4.8 - 5.6 % Final    Comment:    (NOTE) Pre diabetes:          5.7%-6.4%  Diabetes:              >6.4%  Glycemic control for   <7.0% adults with diabetes   09/04/2019 03:22 PM 9.9 (H) 4.6 - 6.5 % Final    Comment:    Glycemic Control Guidelines for People with Diabetes:Non Diabetic:  <6%Goal of Therapy: <7%Additional Action Suggested:  >8%     CBG: Recent Labs  Lab 01/18/20 1557 01/18/20 1952 01/18/20 2310 01/19/20 0318 01/19/20 0852  GLUCAP 294* 287* 327* 397* 363*     This patient is critically ill with multiple organ system failure which requires frequent high complexity decision making, assessment, support, evaluation, and titration of therapies. This was completed through the application of advanced  monitoring technologies and extensive interpretation of multiple databases. During this encounter critical care time was devoted to patient care services described in this note for 57 minutes.  Julian Hy, DO 01/19/20 9:48 AM [facility]

## 2020-01-19 NOTE — Progress Notes (Signed)
Inpatient Diabetes Program Recommendations  AACE/ADA: New Consensus Statement on Inpatient Glycemic Control (2015)  Target Ranges:  Prepandial:   less than 140 mg/dL      Peak postprandial:   less than 180 mg/dL (1-2 hours)      Critically ill patients:  140 - 180 mg/dL   Lab Results  Component Value Date   GLUCAP 363 (H) 01/19/2020   HGBA1C 10.7 (H) 01/19/2020    Review of Glycemic Control Results for Alisha Stephenson, Alisha Stephenson (MRN [id number]) as of 01/19/2020 09:37  Ref. Range 01/18/2020 08:36 01/18/2020 11:46 01/18/2020 15:57 01/18/2020 19:52 01/18/2020 23:10 01/19/2020 03:18 01/19/2020 08:52  Glucose-Capillary Latest Ref Range: 70 - 99 mg/dL 365 (H) 295 (H) 294 (H) 287 (H) 327 (H) 397 (H) 363 (H)    Diabetes history: DM 2 Outpatient Diabetes medications: Toujeo 62 units bid, Humalog 15 units tid Current orders for Inpatient glycemic control:  Levemir 80 units bid Novolog 0-20 units Q4 hours Novolog 20 units Q4 hours Tube Feed Coverage  Solumedrol 40 mg Daily Pivot 70 ml/hour  Inpatient Diabetes Program Recommendations:    Glucose 300's, probably not absorbing insulin SQ. Start IV insulin  Due to pt being critically ill  -   Consider using COVID Glycemic control order set start IV insulin. Hyperlgycemia setting on Endotool program.  Thanks,  Tama Headings RN, MSN, BC-ADM Inpatient Diabetes Coordinator Team Pager [phone] (8a-5p)

## 2020-01-19 NOTE — Progress Notes (Signed)
Patient transported to CT and back to 2B02 with no complications.

## 2020-01-19 NOTE — Progress Notes (Signed)
40cc Versed wasted with Idelia Salm, RN at this time.

## 2020-01-19 NOTE — Progress Notes (Signed)
Morning BMET, hepatic function and LDH have yet to result. Called and spoke to lab twice this morning and they report "the system kicked the samples out and we are currently running the samples, but they should result soon."

## 2020-01-19 NOTE — Progress Notes (Signed)
Advanced Heart Failure Rounding Note   Subjective:    Remains intubated/sedated on vent. On VV ECMO.  Had period of agitation with tachypnea and decreased sats. Sats now high 80s low 90s. Unable to increase flow due to chugging.   Bcx + enterococcus -> abx changed Responding to IV lasix but BUN climbing PLTs 22K with no bleeding Remains unresponsive (? Anoxic injury, uremia, sedation, etc)  ECMO   Flow 4.1L RPM 3500 Sweep8.5  Labs:  7.41/43/54/88% Hgb10.8 LDH 608 (pre-cannulation) -> 558 -> 543 -> 501  -> 623 -> 1,110 PTT on bival = 52 Lactic acid2.7 -> 2.5 -> 2.8-> 2.1 -> 3.3 -> 2.1  Objective:   Weight Range:  Vital Signs:   Temp:  [96.3 F (35.7 C)-99.1 F (37.3 C)] 97.2 F (36.2 C) (08/13 1000) Pulse Rate:  [50-152] 68 (08/13 1000) Resp:  [0-47] 0 (08/13 1000) SpO2:  [84 %-100 %] 92 % (08/13 1000) Arterial Line BP: (91-186)/(45-74) 118/59 (08/13 1000) FiO2 (%):  [60 %] 60 % (08/13 0830) Weight:  [138.4 kg] 138.4 kg (08/13 0500) Last BM Date: 01/19/20  Weight change: Filed Weights   01/17/20 0600 01/18/20 0600 01/19/20 0500  Weight: 135.3 kg (!) 136.9 kg (!) 138.4 kg    Intake/Output:   Intake/Output Summary (Last 24 hours) at 01/19/2020 1107 Last data filed at 01/19/2020 1000 Gross per 24 hour  Intake 6174.81 ml  Output 3450 ml  Net 2724.81 ml     Physical Exam: General:  Intubated/sedated  HEENT: normal +ETT Neck: supple. RIJ ECMO cannula. LIJ TLC  appreciated. Cor: PMI nondisplaced. Regular rate & rhythm. No rubs, gallops or murmurs. Lungs: coarse  Abdomen: obese  soft, nontender, nondistended.  Extremities: no cyanosis, clubbing, rash, 3+ edema Neuro: unresponsive  Telemetry: Sinus 70-80s Personally reviewed    Labs: Basic Metabolic Panel: Recent Labs  Lab 01/16/20 0343 01/16/20 0558 01/16/20 1100 01/16/20 1159 01/17/20 1700 01/17/20 2008 01/18/20 0355 01/18/20 0403 01/18/20 1700 01/18/20 1953 01/19/20 0313  01/19/20 0508 01/19/20 0831 01/19/20 0842 01/19/20 0844  NA 155*   < > 154*   < > 150*   < > 153*   < > 149*   < > 148* 149* 148* 149* 148*  K 4.3   < > 5.7*   < > 5.0   < > 3.8   < > 5.4*   < > 4.5 4.2 4.2 4.4 4.2  CL 112*   < > 113*   < > 112*  --  114*  --  113*  --  110  --   --  112*  --   CO2 35*   < > 34*   < > 30  --  28  --  26  --  26  --   --  27  --   GLUCOSE 168*   < > 181*   < > 337*  --  259*  --  336*  --  418*  --   --  402*  --   BUN 72*   < > 84*   < > 97*  --  102*  --  108*  --  106*  --   --  105*  --   CREATININE 0.74   < > 1.07*   < > 1.03*  --  1.23*  --  1.19*  --  1.22*  --   --  1.25*  --   CALCIUM 8.4*   < > 8.1*   < >  8.4*   < > 8.7*   < > 8.3*  --  8.0*  --   --  8.0*  --   MG 3.1*  --  2.9*  --   --   --   --   --  2.7*  --   --   --   --   --   --   PHOS 2.3*  --  4.7*  --   --   --   --   --   --   --   --   --   --   --   --    < > = values in this interval not displayed.    Liver Function Tests: Recent Labs  Lab 01/16/20 0343 01/16/20 0356 01/17/20 0351 01/18/20 0355 01/19/20 0313  AST 54* 54* 58* 50* 147*  ALT 64* 64* 59* 54* 108*  ALKPHOS 81 80 92 342* 214*  BILITOT 0.4 0.6 1.2 1.3* 2.0*  PROT 4.5* 4.6* 4.5* 4.7* 4.4*  ALBUMIN 2.6* 2.7* 3.0* 3.1* 2.7*   No results for input(s): LIPASE, AMYLASE in the last 168 hours. No results for input(s): AMMONIA in the last 168 hours.  CBC: Recent Labs  Lab 01/13/20 0500 01/19/2020 0356 01/17/20 1700 01/17/20 2008 01/18/20 0355 01/18/20 0403 01/18/20 1700 01/18/20 1953 01/19/20 0313 01/19/20 0508 01/19/20 0831 01/19/20 0842 01/19/20 0844  WBC 6.2   < > 7.7  --  9.0  --  11.4*  --  13.6*  --   --  10.8*  --   NEUTROABS 4.9  --   --   --   --   --   --   --   --   --   --   --   --   HGB 12.8   < > 10.8*   < > 10.8*   < > 10.9*   < > 10.8* 10.2* 9.9* 10.3* 10.2*  HCT 42.4   < > 35.2*   < > 35.0*   < > 35.5*   < > 34.9* 30.0* 29.0* 33.2* 30.0*  MCV 94.2   < > 94.1  --  92.8  --  93.2  --   93.8  --   --  94.1  --   PLT 132*   < > 92*  --  63*  --  37*  --  26*  --   --  27*  --    < > = values in this interval not displayed.    Cardiac Enzymes: No results for input(s): CKTOTAL, CKMB, CKMBINDEX, TROPONINI in the last 168 hours.  BNP: BNP (last 3 results) No results for input(s): BNP in the last 8760 hours.  ProBNP (last 3 results) No results for input(s): PROBNP in the last 8760 hours.    Other results:  Imaging: DG CHEST PORT 1 VIEW  Result Date: 01/19/2020 CLINICAL DATA:  Hypoxia EXAM: PORTABLE CHEST 1 VIEW COMPARISON:  January 18, 2020 FINDINGS: Endotracheal tube tip is 2.8 cm above the carina. Feeding tube tip is below the diaphragm. Central catheter tips are in the superior vena cava. ECMO catheter extends below the diaphragm on the right. No pneumothorax. There is widespread airspace opacity bilaterally with underlying pleural effusions. Heart is prominent, stable. Pulmonary vascularity largely obscured by overlying airspace opacity. Postoperative change noted in the lower cervical region. IMPRESSION: Persistent widespread airspace opacity with underlying pleural effusions. Tube and catheter positions as described without pneumothorax. Stable cardiac silhouette. Electronically Signed  By: Lowella Grip III M.D.   On: 01/19/2020 08:20   DG CHEST PORT 1 VIEW  Result Date: 01/18/2020 CLINICAL DATA:  COVID-19 positivity, ECMO EXAM: PORTABLE CHEST 1 VIEW COMPARISON:  01/17/2020 FINDINGS: Endotracheal tube, feeding catheter, left jugular central line and right-sided PICC line are again identified and stable. Large ECMO cannula is noted in the right neck. Cardiac shadow is stable. Slight improved aeration is noted in both lungs although persistent opacities remain consistent with the given clinical history. No bony abnormality is noted. IMPRESSION: Slight improved aeration bilaterally. Tubes and lines as described. Electronically Signed   By: Inez Catalina M.D.   On:  01/18/2020 08:18   US Abdomen Limited RUQ  Result Date: 01/18/2020 CLINICAL DATA:  Hyperbilirubinemia.  History of cholecystectomy. EXAM: ULTRASOUND ABDOMEN LIMITED RIGHT UPPER QUADRANT COMPARISON:  None. FINDINGS: Gallbladder: Surgically absent. Common bile duct: Diameter: 3 mm Liver: Diffusely echogenic indicating fatty infiltration. No focal mass or lesion is identified within the liver. Portal vein is patent on color Doppler imaging with normal direction of blood flow towards the liver. Other: None. IMPRESSION: 1. No acute findings. 2. No bile duct dilatation. 3. Fatty infiltration of the liver. 4. Status post cholecystectomy. Electronically Signed   By: Franki Cabot M.D.   On: 01/18/2020 16:29     Medications:     Scheduled Medications: . artificial tears  1 application Both Eyes H4R  . vitamin C  500 mg Per Tube Daily  . chlorhexidine gluconate (MEDLINE KIT)  15 mL Mouth Rinse BID  . Chlorhexidine Gluconate Cloth  6 each Topical Daily  . cisatracurium  10 mg Intravenous Once  . clonazePAM  1 mg Per Tube BID  . docusate  100 mg Per Tube BID  . feeding supplement (PROSource TF)  45 mL Per Tube Daily  . fentaNYL (SUBLIMAZE) injection  50 mcg Intravenous Once  . free water  300 mL Per Tube Q2H  . iohexol  500 mL Oral Q1H  . linagliptin  5 mg Per Tube Daily  . mouth rinse  15 mL Mouth Rinse 10 times per day  . methylPREDNISolone (SOLU-MEDROL) injection  40 mg Intravenous Daily  . nutrition supplement (JUVEN)  1 packet Per Tube BID BM  . oxyCODONE  5 mg Per Tube Q6H  . pantoprazole sodium  40 mg Per Tube Daily  . polyethylene glycol  17 g Per Tube Daily  . sennosides  5 mL Per Tube BID  . sodium chloride flush  10-40 mL Intracatheter Q12H  . zinc sulfate  220 mg Per Tube Daily    Infusions: . sodium chloride Stopped (01/22/2020 2352)  . sodium chloride Stopped (01/17/2020 1009)  . albumin human Stopped (01/17/20 2258)  . bivalirudin (ANGIOMAX) infusion 0.5 mg/mL (Non-ACS  indications) 0.075 mg/kg/hr (01/19/20 1000)  . cisatracurium (NIMBEX) infusion Stopped (01/17/20 1227)  . dexmedetomidine (PRECEDEX) IV infusion 1.2 mcg/kg/hr (01/19/20 1000)  . dextrose 5 % and 0.45% NaCl Stopped (01/14/2020 0827)  . feeding supplement (PIVOT 1.5 CAL) 1,000 mL (01/19/20 0501)  . fentaNYL infusion INTRAVENOUS 200 mcg/hr (01/19/20 1000)  . furosemide (LASIX) infusion 8 mg/hr (01/19/20 1000)  . insulin 17 Units/hr (01/19/20 1030)  . midazolam Stopped (01/19/20 0926)  . norepinephrine (LEVOPHED) Adult infusion Stopped (01/19/20 0948)  . piperacillin-tazobactam (ZOSYN)  IV      PRN Medications: sodium chloride, acetaminophen (TYLENOL) oral liquid 160 mg/5 mL, albumin human, albuterol, dextrose, fentaNYL, fentaNYL, guaiFENesin-dextromethorphan, hydrALAZINE, hydrOXYzine, midazolam, ondansetron **OR** ondansetron (ZOFRAN) IV, sodium chloride flush  Assessment/Plan:   1. Acute hypoxic/hypercapneic respiratory failure in setting of severe COVID PNA/ARDS -> VV ECMO - admit 8/2 - intubation 8/3 - has received actmera (compelted 8/2), remdesivir (completed 8/6) and steroids - failed full vent support with proning/paralytic - Cannulated for VV ECMO on 8/9 - Vent down to 60%, CXR improving. TVs improving -> suggests some degree of lung recovery. But sats remain marginal  - Decent response to IV lasix but very volume sensitive with lasix. Still up 30+ pounds. BUN climbing - Major issue now is encephalopathy. ? Uremia, anoxia, sedation - Plan head CT. If unremarkable consider CVVHD for volume removal and clearance  - Elevated LDH likely due to sepsis. Do not see and clots in oxygenator. Follow   2. Enterococcus sepsis - continue vanc = high dose zosyn per pharmacy   3. Thrombocytopenia - suspect due to sepsis.  - no obvious bleeding - transfuse as needed  4. Morbid obesity - Body mass index is 49.38 kg/m.  5. Poorly controlled DM2 - HgBA1c 10.7 - CBGs remain elevated -  Adjust insulin as needed. Switch SQ to IV  4. Hypernatremia - improving slowly continue free water boluses   5. Lactic acidosis - multifactorial. improved  CRITICAL CARE Performed by: Glori Bickers  Total critical care time: 60 minutes  Critical care time was exclusive of separately billable procedures and treating other patients.  Critical care was necessary to treat or prevent imminent or life-threatening deterioration.  Critical care was time spent personally by me (independent of midlevel providers or residents) on the following activities: development of treatment plan with patient and/or surrogate as well as nursing, discussions with consultants, evaluation of patient's response to treatment, examination of patient, obtaining history from patient or surrogate, ordering and performing treatments and interventions, ordering and review of laboratory studies, ordering and review of radiographic studies, pulse oximetry and re-evaluation of patient's condition.   Length of Stay: 14   Glori Bickers  MD 01/19/2020, 11:07 AM  Advanced Heart Failure Team Pager [phone] (M-F; [place])  Please contact [facility] for night-coverage after hours (4p -7a ) and weekends on [email or web address]

## 2020-01-19 NOTE — CV Procedure (Signed)
ECMO NOTE:  Indication: Respiratory failure due to COVID PNA  Initial cannulation date: 02/02/2020  ECMO type: VV ECMO (Centrimag with oximizer)  Dual lumen Inflow/return cannula:  1) 32 FR RIJ [place] placed 02/01/2020  ECMO events:  - Initial cannulation 01/17/2020 - Cannula repositioned (pulled back ) 01/17/20   Daily data:  Flow 4.1L RPM 3500 Sweep 8.5  Labs:  7.41/43/54/88% Hgb10.8 LDH 608 (pre-cannulation) -> 558 -> 543 -> 501  -> 623 -> 1,110 PTT on bival = 52 Lactic acid 2.7 -> 2.5 -> 2.8-> 2.1 -> 3.3 -> 2.1 Plan:  Continue ECMO support. Now off paralytic.  Bcx + enterococcus -> abx changed Responding to IV lasix but BUN climbing PLTs 22K with no bleeding Remains unresponsive (? Anoxic injury, uremia, sedation, etc) Suspect low PLTs and rise in LDH due to sepsis    Plan head CT. If unremarkable probable CVVHD for clearance and volume removal    Discussed in multidisciplinary fashion on ECMO rounds with TCTS, CCM, ECMO coordinator/specialist, RT, PharmD and nursing staff all present.   Glori Bickers, MD  11:04 AM

## 2020-01-19 NOTE — Progress Notes (Signed)
Family (husband, daughter, pts mother, and son) arrived at bedside around 74. Family had a lot of questions regarding pts current condition, new medical findings, and questions about goals of care. Family obviously upset and tearful. They visited outside of the room while the care team established policy/protocol for visitation. Questions and concerns answered to the best of the staffs ability.  Based off the [facility] unit visitor guidelines when end of life discussions are being considered 1 family member (the pts husband) was allowed in the room at the bedside for 15 minutes to help understand the complexity of the care being received.  Following the visitation family and staff had a phone call with Dr Haroldine Laws. The family, the ECMO specialist and bedside RN were on speaker phone with the physician. Dr Haroldine Laws went over pts current situation and answered questions from the family. At this time family was not ready to switch to comfort care. Family went home for the night and will call again in the morning for another update from the doctor.  Before leaving the family requested pts belongings from room. RN retrieved 2 bags from the room which contained pts clothes (1 pair of black sandals, shorts, top, and underwear). Unable to locate pts cell phone that family stated was with pt prior to admission to unit.  Support given to family and encouraged them to call for updates, questions and/or concerns.

## 2020-01-20 ENCOUNTER — Inpatient Hospital Stay (HOSPITAL_COMMUNITY): Payer: PRIVATE HEALTH INSURANCE

## 2020-01-20 DIAGNOSIS — I611 Nontraumatic intracerebral hemorrhage in hemisphere, cortical: Secondary | ICD-10-CM

## 2020-01-20 DIAGNOSIS — U071 COVID-19: Secondary | ICD-10-CM | POA: Diagnosis not present

## 2020-01-20 DIAGNOSIS — Z515 Encounter for palliative care: Secondary | ICD-10-CM

## 2020-01-20 DIAGNOSIS — J9601 Acute respiratory failure with hypoxia: Secondary | ICD-10-CM | POA: Diagnosis not present

## 2020-01-20 DIAGNOSIS — D696 Thrombocytopenia, unspecified: Secondary | ICD-10-CM | POA: Diagnosis not present

## 2020-01-20 DIAGNOSIS — Z9281 Personal history of extracorporeal membrane oxygenation (ECMO): Secondary | ICD-10-CM

## 2020-01-20 DIAGNOSIS — J9602 Acute respiratory failure with hypercapnia: Secondary | ICD-10-CM | POA: Diagnosis not present

## 2020-01-20 DIAGNOSIS — J9600 Acute respiratory failure, unspecified whether with hypoxia or hypercapnia: Secondary | ICD-10-CM | POA: Diagnosis not present

## 2020-01-20 DIAGNOSIS — G9340 Encephalopathy, unspecified: Secondary | ICD-10-CM

## 2020-01-20 DIAGNOSIS — R7989 Other specified abnormal findings of blood chemistry: Secondary | ICD-10-CM

## 2020-01-20 DIAGNOSIS — J80 Acute respiratory distress syndrome: Secondary | ICD-10-CM | POA: Diagnosis not present

## 2020-01-20 DIAGNOSIS — Z7189 Other specified counseling: Secondary | ICD-10-CM

## 2020-01-20 LAB — BASIC METABOLIC PANEL
Anion gap: 9 (ref 5–15)
Anion gap: 11 (ref 5–15)
BUN: 94 mg/dL — ABNORMAL HIGH (ref 6–20)
BUN: 95 mg/dL — ABNORMAL HIGH (ref 6–20)
CO2: 28 mmol/L (ref 22–32)
CO2: 28 mmol/L (ref 22–32)
Calcium: 8.1 mg/dL — ABNORMAL LOW (ref 8.9–10.3)
Calcium: 8.5 mg/dL — ABNORMAL LOW (ref 8.9–10.3)
Chloride: 110 mmol/L (ref 98–111)
Chloride: 106 mmol/L (ref 98–111)
Creatinine, Ser: 1.21 mg/dL — ABNORMAL HIGH (ref 0.44–1.00)
Creatinine, Ser: 1.21 mg/dL — ABNORMAL HIGH (ref 0.44–1.00)
GFR calc Af Amer: 58 mL/min — ABNORMAL LOW (ref >60)
GFR calc Af Amer: 58 mL/min — ABNORMAL LOW (ref >60)
GFR calc non Af Amer: 50 mL/min — ABNORMAL LOW (ref >60)
GFR calc non Af Amer: 50 mL/min — ABNORMAL LOW (ref >60)
Glucose, Bld: 171 mg/dL — ABNORMAL HIGH (ref 70–99)
Glucose, Bld: 209 mg/dL — ABNORMAL HIGH (ref 70–99)
Potassium: 4.6 mmol/L (ref 3.5–5.1)
Potassium: 4.8 mmol/L (ref 3.5–5.1)
Sodium: 147 mmol/L — ABNORMAL HIGH (ref 135–145)
Sodium: 145 mmol/L (ref 135–145)

## 2020-01-20 LAB — POCT I-STAT 7, (LYTES, BLD GAS, ICA,H+H)
Acid-Base Excess: 4 mmol/L — ABNORMAL HIGH (ref 0.0–2.0)
Acid-Base Excess: 4 mmol/L — ABNORMAL HIGH (ref 0.0–2.0)
Acid-Base Excess: 6 mmol/L — ABNORMAL HIGH (ref 0.0–2.0)
Acid-Base Excess: 9 mmol/L — ABNORMAL HIGH (ref 0.0–2.0)
Bicarbonate: 29.2 mmol/L — ABNORMAL HIGH (ref 20.0–28.0)
Bicarbonate: 29 mmol/L — ABNORMAL HIGH (ref 20.0–28.0)
Bicarbonate: 30.8 mmol/L — ABNORMAL HIGH (ref 20.0–28.0)
Bicarbonate: 32.8 mmol/L — ABNORMAL HIGH (ref 20.0–28.0)
Calcium, Ion: 1.15 mmol/L (ref 1.15–1.40)
Calcium, Ion: 1.11 mmol/L — ABNORMAL LOW (ref 1.15–1.40)
Calcium, Ion: 1.15 mmol/L (ref 1.15–1.40)
Calcium, Ion: 1.12 mmol/L — ABNORMAL LOW (ref 1.15–1.40)
HCT: 30 % — ABNORMAL LOW (ref 36.0–46.0)
HCT: 29 % — ABNORMAL LOW (ref 36.0–46.0)
HCT: 32 % — ABNORMAL LOW (ref 36.0–46.0)
HCT: 33 % — ABNORMAL LOW (ref 36.0–46.0)
Hemoglobin: 10.2 g/dL — ABNORMAL LOW (ref 12.0–15.0)
Hemoglobin: 9.9 g/dL — ABNORMAL LOW (ref 12.0–15.0)
Hemoglobin: 10.9 g/dL — ABNORMAL LOW (ref 12.0–15.0)
Hemoglobin: 11.2 g/dL — ABNORMAL LOW (ref 12.0–15.0)
O2 Saturation: 88 %
O2 Saturation: 84 %
O2 Saturation: 96 %
O2 Saturation: 97 %
Patient temperature: 37.6
Patient temperature: 37.4
Patient temperature: 36.2
Patient temperature: 35.6
Potassium: 4.1 mmol/L (ref 3.5–5.1)
Potassium: 4.3 mmol/L (ref 3.5–5.1)
Potassium: 4.6 mmol/L (ref 3.5–5.1)
Potassium: 6.3 mmol/L (ref 3.5–5.1)
Sodium: 147 mmol/L — ABNORMAL HIGH (ref 135–145)
Sodium: 147 mmol/L — ABNORMAL HIGH (ref 135–145)
Sodium: 147 mmol/L — ABNORMAL HIGH (ref 135–145)
Sodium: 145 mmol/L (ref 135–145)
TCO2: 31 mmol/L (ref 22–32)
TCO2: 30 mmol/L (ref 22–32)
TCO2: 32 mmol/L (ref 22–32)
TCO2: 34 mmol/L — ABNORMAL HIGH (ref 22–32)
pCO2 arterial: 45.8 mmHg (ref 32.0–48.0)
pCO2 arterial: 48.2 mmHg — ABNORMAL HIGH (ref 32.0–48.0)
pCO2 arterial: 40.6 mmHg (ref 32.0–48.0)
pCO2 arterial: 37.9 mmHg (ref 32.0–48.0)
pH, Arterial: 7.414 (ref 7.350–7.450)
pH, Arterial: 7.39 (ref 7.350–7.450)
pH, Arterial: 7.484 — ABNORMAL HIGH (ref 7.350–7.450)
pH, Arterial: 7.54 — ABNORMAL HIGH (ref 7.350–7.450)
pO2, Arterial: 56 mmHg — ABNORMAL LOW (ref 83.0–108.0)
pO2, Arterial: 51 mmHg — ABNORMAL LOW (ref 83.0–108.0)
pO2, Arterial: 74 mmHg — ABNORMAL LOW (ref 83.0–108.0)
pO2, Arterial: 78 mmHg — ABNORMAL LOW (ref 83.0–108.0)

## 2020-01-20 LAB — BPAM PLATELET PHERESIS
Blood Product Expiration Date: 202108162359
ISSUE DATE / TIME: 202108140815
Unit Type and Rh: 6200

## 2020-01-20 LAB — CBC
HCT: 33.7 % — ABNORMAL LOW (ref 36.0–46.0)
HCT: 34.9 % — ABNORMAL LOW (ref 36.0–46.0)
Hemoglobin: 10.7 g/dL — ABNORMAL LOW (ref 12.0–15.0)
Hemoglobin: 10.7 g/dL — ABNORMAL LOW (ref 12.0–15.0)
MCH: 29.5 pg (ref 26.0–34.0)
MCH: 28.2 pg (ref 26.0–34.0)
MCHC: 31.8 g/dL (ref 30.0–36.0)
MCHC: 30.7 g/dL (ref 30.0–36.0)
MCV: 92.8 fL (ref 80.0–100.0)
MCV: 91.8 fL (ref 80.0–100.0)
Platelets: 33 10*3/uL — ABNORMAL LOW (ref 150–400)
Platelets: 30 10*3/uL — ABNORMAL LOW (ref 150–400)
RBC: 3.63 MIL/uL — ABNORMAL LOW (ref 3.87–5.11)
RBC: 3.8 MIL/uL — ABNORMAL LOW (ref 3.87–5.11)
RDW: 15.9 % — ABNORMAL HIGH (ref 11.5–15.5)
RDW: 16.8 % — ABNORMAL HIGH (ref 11.5–15.5)
WBC: 12.8 10*3/uL — ABNORMAL HIGH (ref 4.0–10.5)
WBC: 11.8 10*3/uL — ABNORMAL HIGH (ref 4.0–10.5)
nRBC: 0.5 % — ABNORMAL HIGH (ref 0.0–0.2)
nRBC: 0.4 % — ABNORMAL HIGH (ref 0.0–0.2)

## 2020-01-20 LAB — CULTURE, BLOOD (ROUTINE X 2)
Special Requests: ADEQUATE
Special Requests: ADEQUATE

## 2020-01-20 LAB — GLUCOSE, CAPILLARY
Glucose-Capillary: 156 mg/dL — ABNORMAL HIGH (ref 70–99)
Glucose-Capillary: 208 mg/dL — ABNORMAL HIGH (ref 70–99)
Glucose-Capillary: 135 mg/dL — ABNORMAL HIGH (ref 70–99)
Glucose-Capillary: 169 mg/dL — ABNORMAL HIGH (ref 70–99)
Glucose-Capillary: 159 mg/dL — ABNORMAL HIGH (ref 70–99)
Glucose-Capillary: 200 mg/dL — ABNORMAL HIGH (ref 70–99)
Glucose-Capillary: 194 mg/dL — ABNORMAL HIGH (ref 70–99)
Glucose-Capillary: 200 mg/dL — ABNORMAL HIGH (ref 70–99)
Glucose-Capillary: 217 mg/dL — ABNORMAL HIGH (ref 70–99)
Glucose-Capillary: 188 mg/dL — ABNORMAL HIGH (ref 70–99)
Glucose-Capillary: 197 mg/dL — ABNORMAL HIGH (ref 70–99)
Glucose-Capillary: 152 mg/dL — ABNORMAL HIGH (ref 70–99)

## 2020-01-20 LAB — HEPATIC FUNCTION PANEL
ALT: 104 U/L — ABNORMAL HIGH (ref 0–44)
AST: 97 U/L — ABNORMAL HIGH (ref 15–41)
Albumin: 2.5 g/dL — ABNORMAL LOW (ref 3.5–5.0)
Alkaline Phosphatase: 189 U/L — ABNORMAL HIGH (ref 38–126)
Bilirubin, Direct: 0.5 mg/dL — ABNORMAL HIGH (ref 0.0–0.2)
Indirect Bilirubin: 1.2 mg/dL — ABNORMAL HIGH (ref 0.3–0.9)
Total Bilirubin: 1.7 mg/dL — ABNORMAL HIGH (ref 0.3–1.2)
Total Protein: 4.6 g/dL — ABNORMAL LOW (ref 6.5–8.1)

## 2020-01-20 LAB — PROTIME-INR
INR: 1.4 — ABNORMAL HIGH (ref 0.8–1.2)
Prothrombin Time: 16.4 seconds — ABNORMAL HIGH (ref 11.4–15.2)

## 2020-01-20 LAB — CULTURE, RESPIRATORY W GRAM STAIN: Gram Stain: NONE SEEN

## 2020-01-20 LAB — PREPARE PLATELET PHERESIS: Unit division: 0

## 2020-01-20 LAB — VANCOMYCIN, TROUGH: Vancomycin Tr: 25 ug/mL (ref 15–20)

## 2020-01-20 LAB — LACTIC ACID, PLASMA: Lactic Acid, Venous: 1.5 mmol/L (ref 0.5–1.9)

## 2020-01-20 LAB — FIBRINOGEN: Fibrinogen: 234 mg/dL (ref 210–475)

## 2020-01-20 LAB — LACTATE DEHYDROGENASE: LDH: 734 U/L — ABNORMAL HIGH (ref 98–192)

## 2020-01-20 MED ORDER — SODIUM CHLORIDE 0.9% IV SOLUTION: Freq: Once | INTRAVENOUS | Status: DC

## 2020-01-20 MED ORDER — VANCOMYCIN HCL IN DEXTROSE 1-5 GM/200ML-% IV SOLN
1000.0000 mg | Freq: Two times a day (BID) | INTRAVENOUS | Status: DC
  Administered 2020-01-21: 1000 mg via INTRAVENOUS

## 2020-01-20 MED ORDER — SODIUM BICARBONATE 8.4 % IV SOLN
INTRAVENOUS | Status: AC
  Filled 2020-01-20: qty 50

## 2020-01-20 MED ORDER — METOLAZONE 5 MG PO TABS
5.0000 mg | ORAL_TABLET | Freq: Once | ORAL | Status: AC
  Administered 2020-01-20: 5 mg
  Filled 2020-01-20: qty 1

## 2020-01-20 MED ORDER — ALBUMIN HUMAN 5 % IV SOLN
INTRAVENOUS | Status: AC
  Filled 2020-01-20: qty 250

## 2020-01-20 MED ORDER — ALBUMIN HUMAN 25 % IV SOLN
12.5000 g | Freq: Once | INTRAVENOUS | Status: AC
  Administered 2020-01-20: 12.5 g via INTRAVENOUS

## 2020-01-20 MED ORDER — CALCIUM CHLORIDE 10 % IV SOLN
INTRAVENOUS | Status: AC
  Filled 2020-01-20: qty 10

## 2020-01-20 MED ORDER — INSULIN ASPART 100 UNIT/ML ~~LOC~~ SOLN
3.0000 [IU] | SUBCUTANEOUS | Status: DC
  Administered 2020-01-21: 6 [IU] via SUBCUTANEOUS

## 2020-01-20 MED ORDER — INSULIN DETEMIR 100 UNIT/ML ~~LOC~~ SOLN
38.0000 [IU] | Freq: Two times a day (BID) | SUBCUTANEOUS | Status: DC
  Administered 2020-01-20 – 2020-01-21 (×2): 38 [IU] via SUBCUTANEOUS
  Filled 2020-01-20 (×3): qty 0.38

## 2020-01-20 MED ORDER — VANCOMYCIN HCL IN DEXTROSE 1-5 GM/200ML-% IV SOLN: 1000.0000 mg | Freq: Two times a day (BID) | INTRAVENOUS | Status: DC

## 2020-01-20 MED ORDER — EPINEPHRINE 1 MG/10ML IJ SOSY
PREFILLED_SYRINGE | INTRAMUSCULAR | Status: AC
  Filled 2020-01-20: qty 20

## 2020-01-20 MED ORDER — PHENYLEPHRINE 40 MCG/ML (10ML) SYRINGE FOR IV PUSH (FOR BLOOD PRESSURE SUPPORT)
PREFILLED_SYRINGE | INTRAVENOUS | Status: AC
  Filled 2020-01-20: qty 10

## 2020-01-20 NOTE — Progress Notes (Signed)
STROKE TEAM PROGRESS NOTE   HISTORY OF PRESENT ILLNESS (per record) Alisha Stephenson is a 54 y.o. Caucasian female with PMH of diabetes and morbid obesity admitted for Covid pneumonia.  Patient and her husband were positive for Covid at the end of July.  Initially wheeze cough and diarrhea, but gradually getting worse.  Came to ED on 7/31 for severe headache, MRI/MRA negative for acute finding.  8/2 admitted for shortness of breath and Covid pneumonia.  She was intubated 8/3, however condition deteriorated was put on ECMO 8/9.  During the hospitalization, patient developed multiorgan involvement including uremia with BUN 105, AKI, shock liver, thrombocytopenia platelet today 27, bacteremia with Enterococcus sepsis on Zosyn and Vanco, hyperglycemia due to uncontrolled diabetes on insulin drip, hypernatremia sodium today 147 on free water, hypotension needed pressors.  She is still on ventilation with Precedex and fentanyl.  She was continued on Angiomax anticoagulation for ECMO. As per RN, patient has encephalopathy for the last several days, yesterday was still able to stick out tongue as request, however today more encephalopathic, not able to follow any commands.  Pan CT ordered.  CT chest showed Covid ARDS with right more than left pleural effusion.  However, CT head showed multifocal ICH with largest within the right frontoparietal area.  Angiomax discontinued.  Neurology was consulted for further evaluation. LSN: Unclear tPA Given: No: ICH   INTERVAL HISTORY I personally reviewed history of presenting illness, electronic medical records and imaging films in PACS.  Patient presented with Covid pneumonia and developed multiorgan involvement with uremia shock liver, thrombocytopenia, enterococcal sepsis and Covid ARDS.  CT scan showed multifocal parenchymal hemorrhages and follow-up CT scan this morning shows no substantial change with stable appearance and regional mass-effect but no intraventricular  extension no midline shift.  Platelet count has come up to 33,000.,  INR is 1.4 and fibrinogen level is normal at 234.  Blood cultures have grown Enterococcus and she is on vancomycin.   OBJECTIVE Vitals:   01/20/20 1000 01/20/20 1152 01/20/20 1300 01/20/20 1400  BP: 104/61 135/61 129/73   Pulse: 79 74 64 (!) 55  Resp: 20 16 (!) 0 (!) 0  Temp: 99.3 F (37.4 C)   98.1 F (36.7 C)  TempSrc:      SpO2: 91% 94% 97% 96%  Weight:      Height:        CBC:  Recent Labs  Lab 01/19/20 1655 01/19/20 2026 01/20/20 0347 01/20/20 0347 01/20/20 0616 01/20/20 0824  WBC 8.4  --  12.8*  --   --   --   HGB 9.8*   < > 10.7*   < > 10.2* 9.9*  HCT 31.7*   < > 33.7*   < > 30.0* 29.0*  MCV 92.7  --  92.8  --   --   --   PLT 24*  --  33*  --   --   --    < > = values in this interval not displayed.    Basic Metabolic Panel:  Recent Labs  Lab 01/16/20 0343 01/16/20 0558 01/16/20 1100 01/16/20 1159 01/18/20 1700 01/18/20 1953 01/19/20 1655 01/19/20 2026 01/20/20 0347 01/20/20 0347 01/20/20 0616 01/20/20 0824  NA 155*   < > 154*   < > 149*   < > 146*   < > 147*   < > 147* 147*  K 4.3   < > 5.7*   < > 5.4*   < > 4.1   < >  4.6   < > 4.1 4.3  CL 112*   < > 113*   < > 113*   < > 109  --  110  --   --   --   CO2 35*   < > 34*   < > 26   < > 26  --  28  --   --   --   GLUCOSE 168*   < > 181*   < > 336*   < > 241*  --  171*  --   --   --   BUN 72*   < > 84*   < > 108*   < > 99*  --  94*  --   --   --   CREATININE 0.74   < > 1.07*   < > 1.19*   < > 1.12*  --  1.21*  --   --   --   CALCIUM 8.4*   < > 8.1*   < > 8.3*   < > 7.8*  --  8.1*  --   --   --   MG 3.1*   < > 2.9*  --  2.7*  --   --   --   --   --   --   --   PHOS 2.3*  --  4.7*  --   --   --   --   --   --   --   --   --    < > = values in this interval not displayed.    Lipid Panel:     Component Value Date/Time   CHOL 177 09/04/2019 1522   TRIG 341 (H) 01/10/2020 0346   HDL 32.80 (L) 09/04/2019 1522   CHOLHDL 5 09/04/2019  1522   VLDL 55.4 (H) 09/04/2019 1522   LDLCALC 79 07/05/2017 0908   HgbA1c:  Lab Results  Component Value Date   HGBA1C 10.7 (H) 02/05/2020   Urine Drug Screen: No results found for: LABOPIA, COCAINSCRNUR, LABBENZ, AMPHETMU, THCU, LABBARB  Alcohol Level No results found for: ETH  IMAGING  CT HEAD WO CONTRAST 01/20/2020 IMPRESSION:  No substantial change in multifocal parenchymal hemorrhages. Regional mass effect remains mild. No new hemorrhage.   CT HEAD WO CONTRAST 01/19/2020 IMPRESSION:  Multifocal parenchymal hemorrhages, with the largest within the right parietal lobe. Together with associated edema, there is mild regional mass effect but no herniation. Considerations include sequelae of ECMO anticoagulation, hemorrhagic conversion of embolic infarcts, and vasculitis/vasculopathy.   CT CHEST ABDOMEN PELVIS W CONTRAST 01/19/2020 IMPRESSION:  1. Dense airspace opacity throughout both lungs with air bronchograms, compatible with the clinical diagnosis of COVID ARDS. Dense bilateral pneumonia could also have this appearance.  2. Small to moderate-sized right pleural effusion and small left pleural effusion.  3. Small amount of ascites.  4. Colonic diverticulosis.   DG CHEST PORT 1 VIEW 01/20/2020 IMPRESSION:  Stable appearance of the chest when compared with the prior exam.  DG CHEST PORT 1 VIEW 01/19/2020 IMPRESSION:  Persistent widespread airspace opacity with underlying pleural effusions. Tube and catheter positions as described without pneumothorax. Stable cardiac silhouette.   US Abdomen Limited RUQ 01/18/2020 IMPRESSION:  1. No acute findings.  2. No bile duct dilatation.  3. Fatty infiltration of the liver.  4. Status post cholecystectomy.   Transthoracic Echocardiogram  01/16/2020 IMPRESSIONS  1. Left ventricular ejection fraction, by estimation, is >75%. The left  ventricle has hyperdynamic function. The left ventricle  has no regional  wall motion  abnormalities. Left ventricular diastolic function could not  be evaluated.  2. Right ventricular systolic function is hyperdynamic. The right  ventricular size is normal. Tricuspid regurgitation signal is inadequate  for assessing PA pressure.  3. The mitral valve is grossly normal. No evidence of mitral valve  regurgitation.  4. The aortic valve was not well visualized. Aortic valve regurgitation  is not visualized.  5. The inferior vena cava is normal in size with <50% respiratory  variability, suggesting right atrial pressure of 8 mmHg.   ECG - ST rate 132 BPM. (See cardiology reading for complete details)  PHYSICAL EXAM Blood pressure 129/73, pulse (!) 55, temperature 98.1 F (36.7 C), resp. rate (!) 0, height 5\' 4"  (1.626 m), weight (!) 143.7 kg, SpO2 96 %. Obese middle-aged Caucasian lady who is intubated and sedated but not paralyzed. . Afebrile. Head is nontraumatic. Neck is supple without bruit.    Cardiac exam no murmur or gallop. Lungs are clear to auscultation. Distal pulses are well felt. Neurological Exam : Patient is intubated and sedated.  Comatose and unresponsive eyes are closed.  Pupils 3 mm sluggishly reactive.  Corneal reflexes present.  Doll's eye movement sluggish.  No respiratory effort above ventilator setting.  Weak cough and gag.  No spontaneous extremity movements.  No response to sternal rub or nailbed pressure in all 4 extremities.  Tone is slightly increased.  Plantars are equivocal bilaterally.   ASSESSMENT/PLAN Alisha Stephenson is a 54 y.o. female with history of diabetes and morbid obesity admitted for Covid pneumonia.  She developed encephalopathy and a CT of the head revealed multifocal ICHs with largest within the right frontoparietal area.  She did not receive IV t-PA due to [place].  Multifocal parenchymal hemorrhages.  Resultant comatose unresponsive state     CT head - 01/20/20 - multifocal parenchymal hemorrhages.  MRI head - not  ordered  MRA head - not ordered   CTA H&N - not ordered  CT Perfusion - not ordered  Carotid Doppler - not ordered  2D Echo - EF > 75 %. No cardiac source of emboli identified.   Sars Corona Virus 2 - positive  LDL - triglycerides 341  HgbA1c - 10.7  UDS - not ordered  VTE prophylaxis - SCDs Diet  Diet Order            Diet NPO time specified  Diet effective now                 No antithrombotic prior to admission, now on No antithrombotic due to intracerebral hemorrhage and low platelet count  Therapy recommendations:  pending  Disposition:  Pending  Hypertension  Home BP meds: none   Current BP meds: Levophed  Unstable . Long-term BP goal normotensive  Hyperlipidemia  Home Lipid lowering medication: none   LDL triglycerides 341, goal < 70  Current lipid lowering medication:  None (statin contraindicated with ICH)  Continue statin at discharge  Diabetes  Home diabetic meds: insulin  Current diabetic meds: Tradjenta and insulin  HgbA1c 10.7, goal < 7.0 Recent Labs    01/20/20 0351 01/20/20 0613 01/20/20 0823  GLUCAP 169* 159* 200*    Other Stroke Risk Factors  Former cigarette smoker - quit  Obesity, Body mass index is 54.38 kg/m., recommend weight loss, diet and exercise as appropriate   Other Active Problems  Code status - Limited code   Palliative Care Consult - pending  Anemia -  Hgb - 9.9  Multi organ system failure  AKI - creatinine - 1.21  Intubated / [facility] day # 12 She remains comatose and unresponsive with a poor neurological exam and CT scan shows stable bilateral multifocal intracerebral hemorrhages with mild cytotoxic edema with no intraventricular extension or midline shift.  Prognosis is extremely poor given florid Covid infection with multiorgan involvement.  I had a long discussion over the phone with the patient's husband and daughters about her neurological prognosis as well as overall poor  prognosis with her extensive Covid and answered questions.  Family wants to continue full support for now but if there is no meaningful neurological improvement over the next few days he may be willing to consider comfort care measures.  Discussed with Dr. Pierre Bali, Dr. Ernest Mallick and Dr. Rodman Pickle. This patient is critically ill and at significant risk of neurological worsening, death and care requires constant monitoring of vital signs, hemodynamics,respiratory and cardiac monitoring, extensive review of multiple databases, frequent neurological assessment, discussion with family, other specialists and medical decision making of high complexity.I have made any additions or clarifications directly to the above note.This critical care time does not reflect procedure time, or teaching time or supervisory time of PA/NP/Med Resident etc but could involve care discussion time.  I spent 30 minutes of neurocritical care time  in the care of  this patient.    Antony Contras, MD  To contact Stroke Continuity provider, please refer to [email or web address] After hours, contact General Neurology

## 2020-01-20 NOTE — Progress Notes (Signed)
Cortrak noted to be out.  TF immediately turned off.  o2 sats 92%.  No change in condition noted.  Dr. [name] notified.  Orders received.  Will continue to closely monitor.

## 2020-01-20 NOTE — Consult Note (Signed)
Consultation Note Date: 01/20/2020   Patient Name: Alisha Stephenson  DOB: 02-11-1966  MRN: [id number]  Age / Sex: 54 y.o., female  PCP: Shelda Pal, DO Referring Physician: Julian Hy, DO  Reason for Consultation: Establishing goals of care  HPI/Patient Profile: 54 y.o. female  with past medical history of DM type 2, asthma, diverticulitis, and NAFLD.  She tested positive for Covid-19 on 12/31/19 and at that time was having symptoms of diarrhea, cough, and congestion. She presented to the [facility] ED on 01/06/20 with headache, MRI was negative and she was sent home. She presented again to the [facility] ED on 01/07/2020 with dyspnea. In the ED, she required high flow oxygen to maintain O2 saturation, chest x-ray showed infiltrates concerning for pneumonia. She was started on actemra, steroids, and remdesivir; admitted to [facility]. Her respiratory status rapidly declined requiring intubation. She developed severe ARDS secondary to Covid pneumonia, requiring prone positioning, paralytics, and VV ECMO. Hospital course complicated by multi-system organ failure (thrombocytopenia, AKI, shock liver) as well as enterococcal bacteremia.   Significant events: 8/3--worsening hypoxemia and respiratory distress, transferred to ICU, intubated.  8/4--proned and requiring paralytics 8/5--completed remdesivir 8/9--unable to oxygenate despite maximal vent settings, transferred to [facility], underwent successful VV ECMO cannulation in the cath lab, started on Angiomax for anticoagulation 8/13--acute mental status change, CT head shows multifocal areas of parenchymal hemorrhage bilaterally   Clinical Assessment and Goals of Care: I have reviewed medical records including EPIC notes, labs and imaging, and received report from the bedside RN.  I spoke with husband [name] and daughters by phone to discuss  diagnosis, prognosis, GOC, EOL wishes, disposition, and options.  I introduced Palliative Medicine as specialized medical care for people living with serious illness. It focuses on providing relief from the symptoms and stress of a serious illness.   We discussed a brief life review of the patient. She is originally from [place], [place]. She and [name] have been married for 22 years. They have 2 daughters, and 3 grandchildren. Prior to admission, she was working full-time for [employer], is over a Therapist, sports. [name] shares with me that Nichol "loves life" and enjoys spending time with their grandchildren and camping.   We discussed her current illness and what it means in the larger context of her ongoing co-morbidities. Family expresses they understand the severity of her illness. We discussed her current medical situation--that anticoagulation has been discontinued due to intracranial hemorrhage. Discussed that the absence of anticoagulation with patient on ECMO can lead to pump failure or thrombotic complications. Family understands all of this however wants to continue all treatment at this time. They are ultimately hopeful for improvement, however husband [name] does express he does not "want her to suffer".   We discuss her current neurologic status. Family has spoken with neurology just before our conversation. They verbalize that his recommendation was to wait 24-48 hours prior to making any major decisions to allow for more time to fully  assess her neuro status. They verbalize to me that it was explained to them that she has required high amounts of sedation for ventilator compliance, and extra time is needed for it to completely clear from her system. I informed family that patient has not had any sedation for at least 72 hours (per bedside RN, as verified by pharmacy).        Family expresses at length their frustration regarding current visitation guidelines.  Emotional support was provided. Significant time was spent clarifying current guidelines and communicatng with bedside RN, 2H charge RN, [facility], and advanced heart failure team. Although patient has overall poor prognosis and is at high risk for rapid deterioration, she is not considered end-of-life while ECMO treatment is ongoing. Current visitation guidelines for covid positive patients allow visitation only at end-of-life situations.   The difference between aggressive medical intervention and comfort care was discussed. Family is not ready to transition to comfort care at this time.   Questions and concerns were addressed.  The family was encouraged to call with questions or concerns. I let them know I will follow-up with them tomorrow.   Primary decision maker: husband [name]   The above encounter was completed via telephone due to the visitor restrictions during the COVID-19 pandemic. Thorough chart review and discussion with necessary members of the care team was completed as part of assessment.   SUMMARY OF RECOMMENDATIONS   - no CPR/ACLS in the event of cardiac arrest  - family will need ongoing support  - in the event of rapid decline, please allow for 3 visitors at bedside (per visitation guidelines) - I will follow-up with family tomorrow   Palliative Prophylaxis:   Aspiration, Oral Care and Turn Reposition  Prognosis:   Poor in the setting of ARDS secondary to covid PNA and intracranial hemorrhage  Discharge Planning: To Be Determined      Primary Diagnoses: Present on Admission: . Pneumonia due to COVID-19 virus . Diabetes mellitus type 1.5, managed as type 1 ([place]) . Acute respiratory failure due to COVID-19 ([place]) . NAFLD (nonalcoholic fatty liver disease)   I have reviewed the medical record, interviewed the patient and family, and examined the patient. The following aspects are pertinent.  Past Medical History:  Diagnosis Date  . Asthma   . Diabetes mellitus  without complication ([place])   . Diverticulitis   . Gallstones   . IBS (irritable bowel syndrome)   . NAFLD (nonalcoholic fatty liver disease)    CT scan 2015  . Ovarian cyst    Social History   Socioeconomic History  . Marital status: Married    Spouse name: Not on file  . Number of children: 2  . Years of education: Not on file  . Highest education level: Not on file  Occupational History  . Occupation: Therapist, art   Tobacco Use  . Smoking status: Former Research scientist (life sciences)  . Smokeless tobacco: Never Used  . Tobacco comment: quit over 10 years ago  Vaping Use  . Vaping Use: Never used  Substance and Sexual Activity  . Alcohol use: No    Comment: rarely  . Drug use: No  . Sexual activity: Never  Other Topics Concern  . Not on file  Social History Narrative  . Not on file   Scheduled Meds: . sodium chloride   Intravenous Once  . artificial tears  1 application Both Eyes F1Q  . vitamin C  500 mg Per Tube Daily  . calcium chloride      .  chlorhexidine gluconate (MEDLINE KIT)  15 mL Mouth Rinse BID  . Chlorhexidine Gluconate Cloth  6 each Topical Daily  . clonazePAM  1 mg Per Tube BID  . docusate  100 mg Per Tube BID  . EPINEPHrine      . feeding supplement (PROSource TF)  45 mL Per Tube BID  . fentaNYL (SUBLIMAZE) injection  50 mcg Intravenous Once  . free water  300 mL Per Tube Q2H  . linagliptin  5 mg Per Tube Daily  . mouth rinse  15 mL Mouth Rinse 10 times per day  . methylPREDNISolone (SOLU-MEDROL) injection  40 mg Intravenous Daily  . nutrition supplement (JUVEN)  1 packet Per Tube BID BM  . oxyCODONE  5 mg Per Tube Q6H  . pantoprazole sodium  40 mg Per Tube Daily  . phenylephrine      . polyethylene glycol  17 g Per Tube Daily  . sennosides  5 mL Per Tube BID  . sodium bicarbonate      . sodium chloride flush  10-40 mL Intracatheter Q12H  . zinc sulfate  220 mg Per Tube Daily   Continuous Infusions: . sodium chloride 250 mL (01/20/20 0609)  . sodium chloride  Stopped (02/05/2020 1009)  . albumin human Stopped (01/17/20 2258)  . albumin human    . dexmedetomidine (PRECEDEX) IV infusion 1.2 mcg/kg/hr (01/20/20 1042)  . dextrose 5 % and 0.45% NaCl Stopped (01/14/2020 0827)  . feeding supplement (PIVOT 1.5 CAL) 1,000 mL (01/20/20 0102)  . fentaNYL infusion INTRAVENOUS 350 mcg/hr (01/20/20 0600)  . furosemide (LASIX) infusion 8 mg/hr (01/20/20 0600)  . insulin 13 mL/hr at 01/20/20 0600  . midazolam Stopped (01/19/20 1120)  . norepinephrine (LEVOPHED) Adult infusion 2 mcg/min (01/20/20 0600)  . piperacillin-tazobactam (ZOSYN)  IV 4.5 g (01/20/20 9935)  . vancomycin Stopped (01/20/20 0507)   PRN Meds:.sodium chloride, acetaminophen (TYLENOL) oral liquid 160 mg/5 mL, albumin human, albuterol, dextrose, fentaNYL, fentaNYL, guaiFENesin-dextromethorphan, hydrALAZINE, hydrOXYzine, midazolam, ondansetron **OR** ondansetron (ZOFRAN) IV, sodium chloride flush Medications Prior to Admission:  Prior to Admission medications   Medication Sig Start Date End Date Taking? Authorizing Provider  albuterol (PROVENTIL HFA;VENTOLIN HFA) 108 (90 Base) MCG/ACT inhaler Inhale 2 puffs into the lungs every 6 (six) hours as needed for wheezing or shortness of breath. 03/23/18   Shelda Pal, DO  azithromycin (ZITHROMAX) 250 MG tablet Take 2 tabs the first day and then 1 tab daily until you run out. 01/02/20   Nani Ravens, Crosby Oyster, DO  benzonatate (TESSALON) 200 MG capsule Take 200 mg by mouth 3 (three) times daily. 12/31/19   [provider]  Blood Glucose Monitoring Suppl (ONETOUCH VERIO) w/Device KIT Test as directed once weekly to check blood sugar. 04/25/18   Shelda Pal, DO  dicyclomine (BENTYL) 10 MG capsule Take 1 capsule (10 mg total) by mouth 4 (four) times daily -  before meals and at bedtime. 09/13/19   Shelda Pal, DO  doxycycline (VIBRA-TABS) 100 MG tablet Take 100 mg by mouth 2 (two) times daily. 10 day supply 12/31/19    [provider]  fluticasone (FLONASE) 50 MCG/ACT nasal spray PLACE 2 SPRAYS INTO BOTH NOSTRILS DAILY. 08/24/18   Saguier, Percell Miller, PA-C  glucose blood (ONETOUCH VERIO) test strip USE STRIP TO CHECK GLUCOSE THREE TIMES DAILY 08/31/18   Shelda Pal, DO  ibuprofen (ADVIL) 600 MG tablet Take 1 tablet (600 mg total) by mouth every 8 (eight) hours as needed. 01/06/20   Charlesetta Shanks, MD  insulin lispro (HUMALOG) 100 UNIT/ML KwikPen 15 units with each meal + 8 g/1 unit sliding scale. 06/13/19   Shelda Pal, DO  meloxicam (MOBIC) 7.5 MG tablet Take 1 tablet (7.5 mg total) by mouth daily. 12/11/19   Tasia Catchings, Amy V, PA-C  ONE TOUCH LANCETS MISC Use once a week to check blood sugar. 04/25/18   Shelda Pal, DO  tiZANidine (ZANAFLEX) 2 MG tablet Take 1 tablet (2 mg total) by mouth every 8 (eight) hours as needed for muscle spasms. 12/11/19   Yu, Amy V, PA-C  TOUJEO MAX SOLOSTAR 300 UNIT/ML Solostar Pen INJECT 62 UNITS INTO THE SKIN 2 (TWO) TIMES DAILY. Patient taking differently: Inject 62 Units into the skin 2 (two) times daily.  11/24/19   Shelda Pal, DO   Allergies  Allergen Reactions  . Sulfa Antibiotics Rash and Other (See Comments)   Review of Systems  Unable to perform ROS: Intubated    Vital Signs: BP 135/61   Pulse 74   Temp 99.3 F (37.4 C)   Resp 16   Ht 5' 4"  (1.626 m)   Wt (!) 143.7 kg   SpO2 94%   BMI 54.38 kg/m  Pain Scale: CPOT   Pain Score: 0-No pain   SpO2: SpO2: 94 % O2 Device:SpO2: 94 % O2 Flow Rate: .O2 Flow Rate (L/min): 50 L/min  IO: Intake/output summary:   Intake/Output Summary (Last 24 hours) at 01/20/2020 1239 Last data filed at 01/20/2020 0600 Gross per 24 hour  Intake 4133.12 ml  Output 2735 ml  Net 1398.12 ml    LBM: Last BM Date: 01/19/20 Baseline Weight: Weight: (!) 122.5 kg Most recent weight: Weight: (!) 143.7 kg      Palliative Assessment/Data: 10%     Time In: 12:00 Time Out: 13:10 Time Total:  70 minutes Greater than 50%  of this time was spent counseling and coordinating care related to the above assessment and plan.  Signed by: Lavena Bullion, NP   Please contact Palliative Medicine Team phone at [phone] for questions and concerns.  For individual provider: See Shea Evans

## 2020-01-20 NOTE — Progress Notes (Addendum)
NAME:  Alisha Stephenson, MRN:  [id number], DOB:  April 02, 1966, LOS: 34 ADMISSION DATE:  02/06/2020, CONSULTATION DATE:  8/3 REFERRING MD:  Alfredia Ferguson, CHIEF COMPLAINT:  Dyspnea   Brief History   54 y/o female admitted on 8/2 with severe acute respiratory failure with hypoxemia due to COVID 19 pneumonia.  She developed symptoms 1 week prior to admission.  Past Medical History  DM2 Diverticulitis Gallstones Ovarian cyst NAFLD Asthma  Significant Hospital Events   8/2 admit 8/3 ICU transfer, intubated 8/4 prone, paralyze 8/9 significant desaturations today 8/9 VV ECMO cannulation  Consults:  PCCM ECMO team    Procedures:  8/3 ETT >  8/3 PICC >  8/9 RIJ [place] 54F   Significant Diagnostic Tests:  7/31 CT head > NAICP 7/31 MRI/MRA brain > no acute changes, possibly small aneurysm ACOM  8/13 CT head> multiple areas of ICH  Micro Data:  8/2 blood > NG 8/2 SARS COV 2 > positive 8/4 resp > negative 8/4 urine >  8/12 blood > E. faecalis  8/12 resp: staph aureus>  Antimicrobials:  8/2 remdesivir > 8/6 8/2 actemra  8/2 solumedrol >   8/3 ceftriaxone >  8/5 8/3 azithro >  8/5  8/12 meropenem> 8/12 vanc>  Interim history/subjective:  ICH discovered yesterday on head CT.  Family came to the hospital last night and husband was able to have a 15-minute visit with the patient.  Bivalirudin immediately discontinued.  Family remains hopeful for recovery but understands the gravity of her illness.  Objective   Blood pressure (!) 108/58, pulse 72, temperature 99.7 F (37.6 C), resp. rate 16, height 5\' 4"  (1.626 m), weight (!) 143.7 kg, SpO2 91 %. CVP:  [12 mmHg-24 mmHg] 15 mmHg  Vent Mode: PCV FiO2 (%):  [60 %-80 %] 80 % Set Rate:  [10 bmp-14 bmp] 14 bmp PEEP:  [14 cmH20] 14 cmH20 Plateau Pressure:  [27 cmH20-28 cmH20] 28 cmH20   Intake/Output Summary (Last 24 hours) at 01/20/2020 0728 Last data filed at 01/20/2020 0600 Gross per 24 hour  Intake 5909.25 ml  Output 3835 ml   Net 2074.25 ml   Filed Weights   01/18/20 0600 01/19/20 0500 01/20/20 0404  Weight: (!) 136.9 kg (!) 138.4 kg (!) 143.7 kg    Examination:  General: Critically ill-appearing woman lying in bed intubated, sedated, on ECMO HENT: [place]/AT, eyes anicteric, ETT in place.  Scleral edema. PULM: Rales bilaterally, bloody purulent secretions from endotracheal tube.  Tachypneic breathing above. CV: Regular rate and rhythm, no murmurs GI: Obese, soft, nondistended.  Flexi-Seal in place. Extremities: No cyanosis or clubbing.  Remains extremely edematous in all extremities. Neuro: RASS -5, PERRL, + corneal reflex. Breathing above the vent. No withdrawal from painful stimulation.  Intact cough reflex.  Vent PC 14/ PS 12 + PEEP 14/ 80% with Vt ~250cc P pla unknown due to breathing over (peak 27)   Circuit: 3500RPM, 4.1L/min, 8L sweep flow  CXR personally reviewed- improving aeration, lateral R infiltrate, possibly loculated effusion  CT head: multifocal ICH with R intraventricular extension  Resolved Hospital Problem list     Assessment & Plan:   ARDS due to COVID 19 pneumonia > worsening oxygenation again on 8/6, effusions  VV ECMO cannulation on 8/9 for refractory hypoxemia; now with persistent hypoxemia despite ECMO, worsened in the past 24 hrs. -Con't lung protective ventilation- PEEP 14, PS 12.  -Lasix drip for goal net negative volume status; intolerant to PRN dosing 2/2 chugging.  Adding metolazone plus albumin. -  Remains off NMB -Continue Solu-Medrol per protocol; previously completed remdesivir per protocol.  Tocilizumab given on 8/2. -Decrease sedation as tolerated; goal RASS -1 to 0 -VAP prevention protocol -Bivalirudin for ECMO on hold with ICH  ICH, multifocal. Portends a poor prognosis.  -Continue to hold bivalirudin -Due to high LDH and visible fibrin in circuit, unable to transfuse platelets; ideally would like to keep >50  Shock, likely septic vs due to sedation -NE to  maintain MAP >65  Lactic acidosis 2/2 sepsis; hypoxia corrected -con't to monitor per ECMO protocol -Continue sepsis management  Enterococcus bacteremia  -High-dose Zosyn; appreciate ID's assistance -repeat blood cultures needed  Staph aureus pneumonia -con't vanc  Need for sedation for mechanical ventilation Ventilator dyssynchrony  -wean sedation; off NMB now -Continue fentanyl/precedex infusions + orals oxycodone and clonazepam -try to limit versed infusion as much as possible  AKI; severe azotemia -Continue to monitor daily -Goal of euvolemia-- diuresis, although remaining net positive 2/2 chugging limiting increased rate of diuresis -Electrolyte repletion as needed  Elevated LDH and thrombocytopenia; most likely due to sepsis. Hb stable and low platelets but no schistocytes on smear suggest DIC is not cause. -Continue to monitor per echo protocol -Holding platelet transfusions given concern for clotting circuit  Hyperglycemia > not controlled at all with Peck insulin despite aggressive upward titration.  Assume poor absorption, potentially due to subcutaneous edema.   -Remains on insulin infusion -Goal BG 140-180 while admitted to the ICU  Constipation, resolved  Hypernatremia, worsening -Continue free water repletion 300cc Q2h; will tolerate hypernatremia for now given concern for intracranial hypertension -con't to monitor -avoid NS due to chloride content  Acute anemia due to critical illness -transfuse for Hb <8 -con't to monitor  Hyperbilirubinemia; improving.  Likely 2/2 sepsis. No evidence of CBD obstruction; surgically absent GB. -con't to monitor  Encephalopathy- ICH vs septic vs due to meds vs azotemia. Very concerned for hypoxic brain injury given her severe hypoxia during this admission 2/2 her ARDS. -Repeat head CT today  Best practice:  Diet: tube feeding Pain/Anxiety/Delirium protocol (if indicated): as above VAP protocol (if indicated): yes DVT  prophylaxis: SCDs while bivalirudin off GI prophylaxis: famotidine Glucose control:  insulin infusion Mobility: bed rest Code Status: full Family Communication: husband [name] and daughters updated by Dr. Haroldine Laws Disposition: ICU  Labs   CBC: Recent Labs  Lab 01/18/20 1700 01/18/20 1953 01/19/20 0313 01/19/20 0508 01/19/20 0842 01/19/20 0844 01/19/20 1459 01/19/20 1655 01/19/20 2026 01/20/20 0347 01/20/20 0616  WBC 11.4*  --  13.6*  --  10.8*  --   --  8.4  --  12.8*  --   HGB 10.9*   < > 10.8*   < > 10.3*   < > 9.5* 9.8* 10.2* 10.7* 10.2*  HCT 35.5*   < > 34.9*   < > 33.2*   < > 28.0* 31.7* 30.0* 33.7* 30.0*  MCV 93.2  --  93.8  --  94.1  --   --  92.7  --  92.8  --   PLT 37*  --  26*  --  27*  --   --  24*  --  33*  --    < > = values in this interval not displayed.    Basic Metabolic Panel: Recent Labs  Lab 01/16/20 0343 01/16/20 0558 01/16/20 1100 01/16/20 1159 01/18/20 1700 01/18/20 1953 01/19/20 0313 01/19/20 0508 01/19/20 1443 01/19/20 0844 01/19/20 1459 01/19/20 1655 01/19/20 2026 01/20/20 0347 01/20/20 0616  NA 155*   < >  154*   < > 149*   < > 148*   < > 149*   < > 147* 146* 147* 147* 147*  K 4.3   < > 5.7*   < > 5.4*   < > 4.5   < > 4.4   < > 4.1 4.1 4.5 4.6 4.1  CL 112*   < > 113*   < > 113*  --  110  --  112*  --   --  109  --  110  --   CO2 35*   < > 34*   < > 26  --  26  --  27  --   --  26  --  28  --   GLUCOSE 168*   < > 181*   < > 336*  --  418*  --  402*  --   --  241*  --  171*  --   BUN 72*   < > 84*   < > 108*  --  106*  --  105*  --   --  99*  --  94*  --   CREATININE 0.74   < > 1.07*   < > 1.19*  --  1.22*  --  1.25*  --   --  1.12*  --  1.21*  --   CALCIUM 8.4*   < > 8.1*   < > 8.3*  --  8.0*  --  8.0*  --   --  7.8*  --  8.1*  --   MG 3.1*  --  2.9*  --  2.7*  --   --   --   --   --   --   --   --   --   --   PHOS 2.3*  --  4.7*  --   --   --   --   --   --   --   --   --   --   --   --    < > = values in this interval not  displayed.   GFR: Estimated Creatinine Clearance: 74.9 mL/min (A) (by C-G formula based on SCr of 1.21 mg/dL (H)). Recent Labs  Lab 01/18/20 0839 01/18/20 1100 01/18/20 1700 01/19/20 0313 01/19/20 0842 01/19/20 1655 01/20/20 0347  WBC  --   --    < > 13.6* 10.8* 8.4 12.8*  LATICACIDVEN 3.4* 3.1*  --  2.1*  --   --  1.5   < > = values in this interval not displayed.    Liver Function Tests: Recent Labs  Lab 01/16/20 0356 01/17/20 0351 01/18/20 0355 01/19/20 0313 01/20/20 0347  AST 54* 58* 50* 147* 97*  ALT 64* 59* 54* 108* 104*  ALKPHOS 80 92 342* 214* 189*  BILITOT 0.6 1.2 1.3* 2.0* 1.7*  PROT 4.6* 4.5* 4.7* 4.4* 4.6*  ALBUMIN 2.7* 3.0* 3.1* 2.7* 2.5*   No results for input(s): LIPASE, AMYLASE in the last 168 hours. No results for input(s): AMMONIA in the last 168 hours.  ABG    Component Value Date/Time   PHART 7.414 01/20/2020 0616   PCO2ART 45.8 01/20/2020 0616   PO2ART 56 (L) 01/20/2020 0616   HCO3 29.2 (H) 01/20/2020 0616   TCO2 31 01/20/2020 0616   ACIDBASEDEF 2.0 01/11/2020 1203   O2SAT 88.0 01/20/2020 0616     Coagulation Profile: Recent Labs  Lab 01/16/20 0356 01/17/20 0351 01/18/20 0355 01/19/20 0313 01/20/20 1610  INR 1.6* 2.1* 2.0* 2.2* 1.4*    Cardiac Enzymes: No results for input(s): CKTOTAL, CKMB, CKMBINDEX, TROPONINI in the last 168 hours.  HbA1C: Hgb A1c MFr Bld  Date/Time Value Ref Range Status  01/16/2020 03:56 AM 10.7 (H) 4.8 - 5.6 % Final    Comment:    (NOTE) Pre diabetes:          5.7%-6.4%  Diabetes:              >6.4%  Glycemic control for   <7.0% adults with diabetes   09/04/2019 03:22 PM 9.9 (H) 4.6 - 6.5 % Final    Comment:    Glycemic Control Guidelines for People with Diabetes:Non Diabetic:  <6%Goal of Therapy: <7%Additional Action Suggested:  >8%     CBG: Recent Labs  Lab 01/19/20 2339 01/20/20 0118 01/20/20 0219 01/20/20 0351 01/20/20 0613  GLUCAP 208* 156* 135* 169* 159*     This patient is  critically ill with multiple organ system failure which requires frequent high complexity decision making, assessment, support, evaluation, and titration of therapies. This was completed through the application of advanced monitoring technologies and extensive interpretation of multiple databases. During this encounter critical care time was devoted to patient care services described in this note for 62 minutes.  Julian Hy, DO 01/20/20 10:12 AM [facility]

## 2020-01-20 NOTE — Progress Notes (Addendum)
Advanced Heart Failure Rounding Note   Subjective:    Remains intubated/sedated on vent. On VV ECMO.  CT head on 8/13 with several areas of intracranial hemorrhage. Bival stopped.   On lasix gtt. Still net positive. BUN down.  On high-dose zosyn and vanc for enterococcus in blood and MSSA in sputum   Drops HR and BP with positional changes  Remains unresponsive   ECMO   Flow 4.1L RPM 3500 Sweep8  Labs:  7.39/48/51/84% on FiO2 60% Hgb10.7 PLT 33 LDH 608 (pre-cannulation) -> 558 -> 543 -> 501  -> 623 -> 1,110 -> 734 PTT => off bival Lactic acid2.7 -> 2.5 -> 2.8-> 2.1 -> 3.3 -> 2.1  Objective:   Weight Range:  Vital Signs:   Temp:  [95.4 F (35.2 C)-100.6 F (38.1 C)] 99.7 F (37.6 C) (08/14 0715) Pulse Rate:  [49-99] 72 (08/14 0715) Resp:  [0-32] 16 (08/14 0715) BP: (88-119)/(48-58) 108/58 (08/14 0700) SpO2:  [84 %-96 %] 91 % (08/14 0715) Arterial Line BP: (91-160)/(45-66) 110/47 (08/14 0715) FiO2 (%):  [60 %-80 %] 80 % (08/14 0630) Weight:  [143.7 kg] 143.7 kg (08/14 0404) Last BM Date: 01/19/20  Weight change: Filed Weights   01/18/20 0600 01/19/20 0500 01/20/20 0404  Weight: (!) 136.9 kg (!) 138.4 kg (!) 143.7 kg    Intake/Output:   Intake/Output Summary (Last 24 hours) at 01/20/2020 0846 Last data filed at 01/20/2020 0600 Gross per 24 hour  Intake 5539.68 ml  Output 3835 ml  Net 1704.68 ml     Physical Exam: General:  Intubated/sedated  HEENT: normal +ETT Neck: supple. RIJ ECMO LIJ TLC Carotids 2+ bilat; no bruits. No lymphadenopathy or thryomegaly appreciated. Cor: PMI nondisplaced. Regular rate & rhythm. No rubs, gallops or murmurs. Lungs: clear decent air movement  Abdomen: obese soft, nontender, nondistended. No hepatosplenomegaly. No bruits or masses. Good bowel sounds. Extremities: no cyanosis, clubbing, rash, 3+ edema Neuro: intubated/sedated +gag, corneals and pupillary reflexes intact   Telemetry: Sinus 70-80s  Personally reviewed    Labs: Basic Metabolic Panel: Recent Labs  Lab 01/16/20 0343 01/16/20 0558 01/16/20 1100 01/16/20 1159 01/18/20 1700 01/18/20 1953 01/19/20 0313 01/19/20 0508 01/19/20 0842 01/19/20 0844 01/19/20 1655 01/19/20 2026 01/20/20 0347 01/20/20 0616 01/20/20 0824  NA 155*   < > 154*   < > 149*   < > 148*   < > 149*   < > 146* 147* 147* 147* 147*  K 4.3   < > 5.7*   < > 5.4*   < > 4.5   < > 4.4   < > 4.1 4.5 4.6 4.1 4.3  CL 112*   < > 113*   < > 113*  --  110  --  112*  --  109  --  110  --   --   CO2 35*   < > 34*   < > 26  --  26  --  27  --  26  --  28  --   --   GLUCOSE 168*   < > 181*   < > 336*  --  418*  --  402*  --  241*  --  171*  --   --   BUN 72*   < > 84*   < > 108*  --  106*  --  105*  --  99*  --  94*  --   --   CREATININE 0.74   < > 1.07*   < >  1.19*  --  1.22*  --  1.25*  --  1.12*  --  1.21*  --   --   CALCIUM 8.4*   < > 8.1*   < > 8.3*   < > 8.0*   < > 8.0*  --  7.8*  --  8.1*  --   --   MG 3.1*  --  2.9*  --  2.7*  --   --   --   --   --   --   --   --   --   --   PHOS 2.3*  --  4.7*  --   --   --   --   --   --   --   --   --   --   --   --    < > = values in this interval not displayed.    Liver Function Tests: Recent Labs  Lab 01/16/20 0356 01/17/20 0351 01/18/20 0355 01/19/20 0313 01/20/20 0347  AST 54* 58* 50* 147* 97*  ALT 64* 59* 54* 108* 104*  ALKPHOS 80 92 342* 214* 189*  BILITOT 0.6 1.2 1.3* 2.0* 1.7*  PROT 4.6* 4.5* 4.7* 4.4* 4.6*  ALBUMIN 2.7* 3.0* 3.1* 2.7* 2.5*   No results for input(s): LIPASE, AMYLASE in the last 168 hours. No results for input(s): AMMONIA in the last 168 hours.  CBC: Recent Labs  Lab 01/18/20 1700 01/18/20 1953 01/19/20 0313 01/19/20 0508 01/19/20 0842 01/19/20 0844 01/19/20 1655 01/19/20 2026 01/20/20 0347 01/20/20 0616 01/20/20 0824  WBC 11.4*  --  13.6*  --  10.8*  --  8.4  --  12.8*  --   --   HGB 10.9*   < > 10.8*   < > 10.3*   < > 9.8* 10.2* 10.7* 10.2* 9.9*  HCT 35.5*   < >  34.9*   < > 33.2*   < > 31.7* 30.0* 33.7* 30.0* 29.0*  MCV 93.2  --  93.8  --  94.1  --  92.7  --  92.8  --   --   PLT 37*  --  26*  --  27*  --  24*  --  33*  --   --    < > = values in this interval not displayed.    Cardiac Enzymes: No results for input(s): CKTOTAL, CKMB, CKMBINDEX, TROPONINI in the last 168 hours.  BNP: BNP (last 3 results) No results for input(s): BNP in the last 8760 hours.  ProBNP (last 3 results) No results for input(s): PROBNP in the last 8760 hours.    Other results:  Imaging: CT HEAD WO CONTRAST  Result Date: 01/19/2020 CLINICAL DATA:  Mental status change, COVID positive, on ECMO EXAM: CT HEAD WITHOUT CONTRAST TECHNIQUE: Contiguous axial images were obtained from the base of the skull through the vertex without intravenous contrast. COMPARISON:  01/06/2020 FINDINGS: Brain: Multifocal areas of parenchymal hemorrhage identified bilaterally with greatest involvement of the parietal lobes. The largest area the right parietal lobe measures approximately 3.8 x 2.3 cm. There is edema associated with these hemorrhages causing mild regional mass effect. No intraventricular extension. No hydrocephalus. Gray-white differentiation is preserved. Vascular: No hyperdense vessel or unexpected calcification. Skull: Calvarium is unremarkable. Sinuses/Orbits: Nonspecific extensive paranasal sinus opacification. Other: Nonspecific mastoid and middle ear opacification. Partially imaged endotracheal and enteric tubes. IMPRESSION: Multifocal parenchymal hemorrhages, with the largest within the right parietal lobe. Together with associated edema, there is mild regional mass  effect but no herniation. Considerations include sequelae of ECMO anticoagulation, hemorrhagic conversion of embolic infarcts, and vasculitis/vasculopathy. These results were called by telephone at the time of interpretation on 01/19/2020 at 2:41 pm to provider Noemi Chapel , who verbally acknowledged these results.  Electronically Signed   By: Macy Mis M.D.   On: 01/19/2020 14:45   CT CHEST ABDOMEN PELVIS W CONTRAST  Result Date: 01/19/2020 CLINICAL DATA:  Sepsis. Leukocytosis. Respiratory failure. COVID ARDS. EXAM: CT CHEST, ABDOMEN, AND PELVIS WITH CONTRAST TECHNIQUE: Multidetector CT imaging of the chest, abdomen and pelvis was performed following the standard protocol during bolus administration of intravenous contrast. CONTRAST:  145m OMNIPAQUE IOHEXOL 350 MG/ML SOLN COMPARISON:  Abdomen and pelvis CT dated 05/10/2014, portable chest obtained earlier today. FINDINGS: CT CHEST FINDINGS Cardiovascular: The right jugular ECMO catheter extends through the superior vena cava and into the inferior vena cava with its tip above the level of the renal veins. Mildly enlarged heart. Minimal pericardial fluid with a maximum thickness of 5 mm. Mediastinum/Nodes: Endotracheal tube tip 1 cm above the carina. Feeding tube extending into the stomach. Unremarkable included thyroid gland. No enlarged lymph nodes. Lungs/Pleura: Dense airspace opacity throughout both lungs with air bronchograms. Small to moderate-sized right pleural effusion and small left pleural effusion. Musculoskeletal: Thoracic spine degenerative changes. Lower cervical spine fixation hardware. CT ABDOMEN PELVIS FINDINGS Hepatobiliary: No focal liver abnormality is seen. Status post cholecystectomy. No biliary dilatation. Pancreas: Unremarkable. No pancreatic ductal dilatation or surrounding inflammatory changes. Spleen: Normal in size without focal abnormality. Adrenals/Urinary Tract: Foley catheter in the urinary bladder with no urine in the bladder. There is associated air in the bladder. Normal appearing adrenal glands, kidneys and ureters. Stomach/Bowel: Large number of sigmoid and distal descending colon diverticula without evidence of diverticulitis. Normal appearing appendix, small bowel and stomach. Feeding tube tip in the mid to distal stomach. Rectal  balloon catheter. Vascular/Lymphatic: No significant vascular findings are present. No enlarged abdominal or pelvic lymph nodes. Reproductive: Status post hysterectomy. No adnexal masses. Other: Mild bilateral subcutaneous edema. Small amount of free peritoneal fluid. Musculoskeletal: Lumbar spine degenerative changes. IMPRESSION: 1. Dense airspace opacity throughout both lungs with air bronchograms, compatible with the clinical diagnosis of COVID ARDS. Dense bilateral pneumonia could also have this appearance. 2. Small to moderate-sized right pleural effusion and small left pleural effusion. 3. Small amount of ascites. 4. Colonic diverticulosis. Electronically Signed   By: SClaudie ReveringM.D.   On: 01/19/2020 15:23   DG CHEST PORT 1 VIEW  Result Date: 01/20/2020 CLINICAL DATA:  Respiratory failure, COVID-19 positivity and ECMO EXAM: PORTABLE CHEST 1 VIEW COMPARISON:  01/19/2020 FINDINGS: Cardiac shadow is stable. Left jugular central line, endotracheal tube and feeding catheter are noted in satisfactory position. ECMO cannulas are noted and stable. Diffuse bilateral airspace opacities are noted consistent with the given clinical history. IMPRESSION: Stable appearance of the chest when compared with the prior exam. Electronically Signed   By: MInez CatalinaM.D.   On: 01/20/2020 08:27   DG CHEST PORT 1 VIEW  Result Date: 01/19/2020 CLINICAL DATA:  Hypoxia EXAM: PORTABLE CHEST 1 VIEW COMPARISON:  January 18, 2020 FINDINGS: Endotracheal tube tip is 2.8 cm above the carina. Feeding tube tip is below the diaphragm. Central catheter tips are in the superior vena cava. ECMO catheter extends below the diaphragm on the right. No pneumothorax. There is widespread airspace opacity bilaterally with underlying pleural effusions. Heart is prominent, stable. Pulmonary vascularity largely obscured by overlying airspace opacity. Postoperative change  noted in the lower cervical region. IMPRESSION: Persistent widespread airspace  opacity with underlying pleural effusions. Tube and catheter positions as described without pneumothorax. Stable cardiac silhouette. Electronically Signed   By: Lowella Grip III M.D.   On: 01/19/2020 08:20   US Abdomen Limited RUQ  Result Date: 01/18/2020 CLINICAL DATA:  Hyperbilirubinemia.  History of cholecystectomy. EXAM: ULTRASOUND ABDOMEN LIMITED RIGHT UPPER QUADRANT COMPARISON:  None. FINDINGS: Gallbladder: Surgically absent. Common bile duct: Diameter: 3 mm Liver: Diffusely echogenic indicating fatty infiltration. No focal mass or lesion is identified within the liver. Portal vein is patent on color Doppler imaging with normal direction of blood flow towards the liver. Other: None. IMPRESSION: 1. No acute findings. 2. No bile duct dilatation. 3. Fatty infiltration of the liver. 4. Status post cholecystectomy. Electronically Signed   By: Franki Cabot M.D.   On: 01/18/2020 16:29     Medications:     Scheduled Medications: . sodium chloride   Intravenous Once  . artificial tears  1 application Both Eyes V2Z  . vitamin C  500 mg Per Tube Daily  . chlorhexidine gluconate (MEDLINE KIT)  15 mL Mouth Rinse BID  . Chlorhexidine Gluconate Cloth  6 each Topical Daily  . cisatracurium  10 mg Intravenous Once  . clonazePAM  1 mg Per Tube BID  . docusate  100 mg Per Tube BID  . feeding supplement (PROSource TF)  45 mL Per Tube BID  . fentaNYL (SUBLIMAZE) injection  50 mcg Intravenous Once  . free water  300 mL Per Tube Q2H  . linagliptin  5 mg Per Tube Daily  . mouth rinse  15 mL Mouth Rinse 10 times per day  . methylPREDNISolone (SOLU-MEDROL) injection  40 mg Intravenous Daily  . nutrition supplement (JUVEN)  1 packet Per Tube BID BM  . oxyCODONE  5 mg Per Tube Q6H  . pantoprazole sodium  40 mg Per Tube Daily  . polyethylene glycol  17 g Per Tube Daily  . sennosides  5 mL Per Tube BID  . sodium chloride flush  10-40 mL Intracatheter Q12H  . zinc sulfate  220 mg Per Tube Daily     Infusions: . sodium chloride 250 mL (01/20/20 0609)  . sodium chloride Stopped (01/16/2020 1009)  . albumin human Stopped (01/17/20 2258)  . cisatracurium (NIMBEX) infusion Stopped (01/17/20 1227)  . dexmedetomidine (PRECEDEX) IV infusion 1.2 mcg/kg/hr (01/20/20 0815)  . dextrose 5 % and 0.45% NaCl Stopped (01/17/2020 0827)  . feeding supplement (PIVOT 1.5 CAL) 1,000 mL (01/20/20 0102)  . fentaNYL infusion INTRAVENOUS 350 mcg/hr (01/20/20 0600)  . furosemide (LASIX) infusion 8 mg/hr (01/20/20 0600)  . insulin 13 mL/hr at 01/20/20 0600  . midazolam Stopped (01/19/20 1120)  . norepinephrine (LEVOPHED) Adult infusion 2 mcg/min (01/20/20 0600)  . piperacillin-tazobactam (ZOSYN)  IV 4.5 g (01/20/20 3664)  . vancomycin Stopped (01/20/20 0507)    PRN Medications: sodium chloride, acetaminophen (TYLENOL) oral liquid 160 mg/5 mL, albumin human, albuterol, dextrose, fentaNYL, fentaNYL, guaiFENesin-dextromethorphan, hydrALAZINE, hydrOXYzine, midazolam, ondansetron **OR** ondansetron (ZOFRAN) IV, sodium chloride flush   Assessment/Plan:   1. Acute hypoxic/hypercapneic respiratory failure in setting of severe COVID PNA/ARDS -> VV ECMO - admit 8/2 - intubation 8/3 - has received actmera (compelted 8/2), remdesivir (completed 8/6) and steroids - failed full vent support with proning/paralytic - Cannulated for VV ECMO on 8/9 - Vent down to 60%, CXR mildly improved but still hypoxic  - Decent response to IV lasix but very volume sensitive with lasix. I/Os  still positive  Up 30+ pounds. Continue lasix gtt. Add metolazone - Off bival due to [place]. Watch circuit closely for clotting  - Elevated LDH likely due to sepsis. Now coming down  - Remains very tenuous with MSSA PNA, enterococcus sepsis, persistent volume overload and now ICH. Have d/w CCM. At this point, chance of meaningful survival is very low. Will repeat head CT today. If ICH extending will consider comfort care - With need to stop [facility] we  discussed possibility of coming off ECMO but not sure she can tolerate that  2. Enterococcus sepsis - continue vanc + high dose zosyn per pharmacy   3. Intracranial hemorrhage - ? Septic emboli - repeat head CT  3. Thrombocytopenia - suspect due to sepsis.  - no obvious bleeding - transfuse as needed  4. Morbid obesity - Body mass index is 49.38 kg/m.  5. Poorly controlled DM2 - HgBA1c 10.7 - CBGs remain elevated - On IV insulin adjust as needed  4. Hypernatremia - improving slowly continue free water boluses   5. Lactic acidosis -improved  Family updated. Plan CT brain this am for further prognostication. Family would not want CPR/code.   CRITICAL CARE Performed by: Glori Bickers  Total critical care time: 50 minutes  Critical care time was exclusive of separately billable procedures and treating other patients.  Critical care was necessary to treat or prevent imminent or life-threatening deterioration.  Critical care was time spent personally by me (independent of midlevel providers or residents) on the following activities: development of treatment plan with patient and/or surrogate as well as nursing, discussions with consultants, evaluation of patient's response to treatment, examination of patient, obtaining history from patient or surrogate, ordering and performing treatments and interventions, ordering and review of laboratory studies, ordering and review of radiographic studies, pulse oximetry and re-evaluation of patient's condition.   Length of Stay: 12   Glori Bickers  MD 01/20/2020, 8:46 AM  Advanced Heart Failure Team Pager [phone] (M-F; [place])  Please contact [facility] for night-coverage after hours (4p -7a ) and weekends on [email or web address]

## 2020-01-20 NOTE — CV Procedure (Signed)
ECMO NOTE:  Indication: Respiratory failure due to COVID PNA  Initial cannulation date: 02/04/2020  ECMO type: VV ECMO (Centrimag with oximizer)  Dual lumen Inflow/return cannula:  1) 32 FR RIJ [place] placed 01/13/2020  ECMO events:  - Initial cannulation 01/17/2020 - Cannula repositioned (pulled back ) 01/17/20   Daily data:  Flow 4.12L RPM 3500 Sweep8  Labs:  7.39/48/51/29/ 84% Hgb10.7 LDH 608 (pre-cannulation) -> 558 -> 543 -> 501  -> 623 -> 1,110-->1103-->734 PTT (off bival 2/2 bleeding) = 52 Lactic acid2.7 -> 2.5 -> 2.8-> 2.1 -> 3.3 -> 2.1->1.5  Plan: Continue ECMO support. Remains off paralytic.  Bcx + enterococcus -> zosyn Not fully responding to IV lasix; BUN remains elevated PLTs 33K with bleeding> ICH. Needs CT to reassess stability. Acute encephalopathy, likely metabolic and due to [place]. Remains unresponsive. Suspect low PLTs and rise in LDH due to sepsis, but circuit with fibrin.   Plan repeat head CT.    Discussed in multidisciplinary fashion on ECMO rounds with Cardiology, ECMO coordinator/specialist, RT, PharmD and nursing staff all present.    Julian Hy, DO 01/20/20 9:50 AM [facility]           Note Details

## 2020-01-20 NOTE — Progress Notes (Signed)
Pt transported to CT via vent, No complications noted during transport.

## 2020-01-20 NOTE — Progress Notes (Signed)
Pharmacy Antibiotic Note  Alisha Stephenson is a 54 y.o. female admitted on 01/14/2020 with COVID pneumonia now s/p VV ECMO cannulation on 01/22/2020.  Pharmacy has been consulted for vancomycin and zosyn dosing.   WBC wnl, currently hypothermic. Lactate increased from 2.1 to 3.3 this morning. CrCl 72 ml/hr. CXR with persistent opacities and slightly improved aeration.  Found to have staph aureus (MSSA) + enterococcus faecalis (pan-sen). Scr 1.21 (CrCl 75 mL/min). LA 1.5. Tmax 100.4. Vancomycin trough this evening came back supratherapeutic at 25 (was drawn appropriately prior to dose when discussed with nursing). Received evening dose on 8/14 at 1622.  Plan: Change vancomycin to 1000 mg IV every 12 hours  Continue zosyn 4.5g IV q6 hr  Monitor renal function, cultures/sensitivities, and clinical progression Check vancomycin trough at steady state   Height: 5\' 4"  (162.6 cm) Weight: (!) 143.7 kg (316 lb 12.8 oz) IBW/kg (Calculated) : 54.7  Temp (24hrs), Avg:99.5 F (37.5 C), Min:95.9 F (35.5 C), Max:100.6 F (38.1 C)  Recent Labs  Lab 01/18/20 0355 01/18/20 0355 01/18/20 0839 01/18/20 1100 01/18/20 1700 01/19/20 0313 01/19/20 0842 01/19/20 1655 01/20/20 0347 01/20/20 1641  WBC 9.0   < >  --   --  11.4* 13.6* 10.8* 8.4 12.8*  --   CREATININE 1.23*   < >  --   --  1.19* 1.22* 1.25* 1.12* 1.21*  --   LATICACIDVEN 3.3*  --  3.4* 3.1*  --  2.1*  --   --  1.5  --   VANCOTROUGH  --   --   --   --   --   --   --   --   --  25*   < > = values in this interval not displayed.    Estimated Creatinine Clearance: 74.9 mL/min (A) (by C-G formula based on SCr of 1.21 mg/dL (H)).    Allergies  Allergen Reactions  . Sulfa Antibiotics Rash and Other (See Comments)    Antimicrobials this admission: Remdesivir 8/2 >> 8/6 Azithromycin 8/3 >> 8/6 CTX 8/3 >> 8/6 Vancomycin 8/12 >> Meropenem 8/12 >>  Dose adjustments this admission: VT 25 - adjust regimen to vanc 1g IV every 12  hours  Microbiology results: 8/12 BCx: sent 8/12 TA: GPC 8/4 TA: neg 8/4 UCx: neg 8/2 BCx: neg 8/4 MRSA PCR neg 8/2 COVID pos  Antonietta Jewel, PharmD, [place] Clinical Pharmacist  Phone: [phone] 01/20/2020 5:45 PM  Please check AMION for all [facility] phone numbers After 10:00 PM, call [place] [phone]

## 2020-01-20 NOTE — Progress Notes (Signed)
  Brain CT reviewed personally. ICH stable but has had increase in surrounding edema.   I d/w Dr Leonie Man who felt that bleed was significant and would likely result in significant cognitive defects down the road.   I spoke with family and palliative care about these findings at length.   I have recommended switch to comfort care when they are ready.   Additional CCT 40 mins.   Glori Bickers, MD  2:10 PM

## 2020-01-20 NOTE — Progress Notes (Signed)
Pt transported to and from CT with no major events noted. Vitals signs noted to be within assigned parameters. Centrimag flow of 4.12 at 3500 rpms prior to transport. Emergency medications and equipment at bedside. Post CT flows of 4.1 at 3500rmps.

## 2020-01-21 ENCOUNTER — Inpatient Hospital Stay (HOSPITAL_COMMUNITY): Payer: PRIVATE HEALTH INSURANCE

## 2020-01-21 DIAGNOSIS — E1159 Type 2 diabetes mellitus with other circulatory complications: Secondary | ICD-10-CM

## 2020-01-21 DIAGNOSIS — I10 Essential (primary) hypertension: Secondary | ICD-10-CM

## 2020-01-21 DIAGNOSIS — I611 Nontraumatic intracerebral hemorrhage in hemisphere, cortical: Secondary | ICD-10-CM | POA: Diagnosis not present

## 2020-01-21 DIAGNOSIS — D649 Anemia, unspecified: Secondary | ICD-10-CM

## 2020-01-21 DIAGNOSIS — J80 Acute respiratory distress syndrome: Secondary | ICD-10-CM | POA: Diagnosis not present

## 2020-01-21 DIAGNOSIS — U071 COVID-19: Secondary | ICD-10-CM | POA: Diagnosis not present

## 2020-01-21 DIAGNOSIS — I612 Nontraumatic intracerebral hemorrhage in hemisphere, unspecified: Secondary | ICD-10-CM

## 2020-01-21 DIAGNOSIS — Z9281 Personal history of extracorporeal membrane oxygenation (ECMO): Secondary | ICD-10-CM

## 2020-01-21 DIAGNOSIS — J9601 Acute respiratory failure with hypoxia: Secondary | ICD-10-CM | POA: Diagnosis not present

## 2020-01-21 DIAGNOSIS — Z9911 Dependence on respirator [ventilator] status: Secondary | ICD-10-CM | POA: Diagnosis not present

## 2020-01-21 LAB — TYPE AND SCREEN
ABO/RH(D): O POS
Antibody Screen: NEGATIVE
Unit division: 0
Unit division: 0
Unit division: 0
Unit division: 0

## 2020-01-21 LAB — POCT I-STAT 7, (LYTES, BLD GAS, ICA,H+H)
Acid-Base Excess: 11 mmol/L — ABNORMAL HIGH (ref 0.0–2.0)
Acid-Base Excess: 9 mmol/L — ABNORMAL HIGH (ref 0.0–2.0)
Acid-Base Excess: 10 mmol/L — ABNORMAL HIGH (ref 0.0–2.0)
Acid-Base Excess: 9 mmol/L — ABNORMAL HIGH (ref 0.0–2.0)
Acid-Base Excess: 12 mmol/L — ABNORMAL HIGH (ref 0.0–2.0)
Acid-Base Excess: 13 mmol/L — ABNORMAL HIGH (ref 0.0–2.0)
Acid-Base Excess: 12 mmol/L — ABNORMAL HIGH (ref 0.0–2.0)
Bicarbonate: 34.1 mmol/L — ABNORMAL HIGH (ref 20.0–28.0)
Bicarbonate: 34 mmol/L — ABNORMAL HIGH (ref 20.0–28.0)
Bicarbonate: 33.7 mmol/L — ABNORMAL HIGH (ref 20.0–28.0)
Bicarbonate: 34 mmol/L — ABNORMAL HIGH (ref 20.0–28.0)
Bicarbonate: 37 mmol/L — ABNORMAL HIGH (ref 20.0–28.0)
Bicarbonate: 38.4 mmol/L — ABNORMAL HIGH (ref 20.0–28.0)
Bicarbonate: 38 mmol/L — ABNORMAL HIGH (ref 20.0–28.0)
Calcium, Ion: 1.18 mmol/L (ref 1.15–1.40)
Calcium, Ion: 1.16 mmol/L (ref 1.15–1.40)
Calcium, Ion: 1.17 mmol/L (ref 1.15–1.40)
Calcium, Ion: 1.16 mmol/L (ref 1.15–1.40)
Calcium, Ion: 1.15 mmol/L (ref 1.15–1.40)
Calcium, Ion: 1.15 mmol/L (ref 1.15–1.40)
Calcium, Ion: 1.18 mmol/L (ref 1.15–1.40)
HCT: 32 % — ABNORMAL LOW (ref 36.0–46.0)
HCT: 30 % — ABNORMAL LOW (ref 36.0–46.0)
HCT: 30 % — ABNORMAL LOW (ref 36.0–46.0)
HCT: 31 % — ABNORMAL LOW (ref 36.0–46.0)
HCT: 31 % — ABNORMAL LOW (ref 36.0–46.0)
HCT: 29 % — ABNORMAL LOW (ref 36.0–46.0)
HCT: 28 % — ABNORMAL LOW (ref 36.0–46.0)
Hemoglobin: 10.9 g/dL — ABNORMAL LOW (ref 12.0–15.0)
Hemoglobin: 10.2 g/dL — ABNORMAL LOW (ref 12.0–15.0)
Hemoglobin: 10.2 g/dL — ABNORMAL LOW (ref 12.0–15.0)
Hemoglobin: 10.5 g/dL — ABNORMAL LOW (ref 12.0–15.0)
Hemoglobin: 10.5 g/dL — ABNORMAL LOW (ref 12.0–15.0)
Hemoglobin: 9.9 g/dL — ABNORMAL LOW (ref 12.0–15.0)
Hemoglobin: 9.5 g/dL — ABNORMAL LOW (ref 12.0–15.0)
O2 Saturation: 96 %
O2 Saturation: 98 %
O2 Saturation: 97 %
O2 Saturation: 92 %
O2 Saturation: 94 %
O2 Saturation: 92 %
O2 Saturation: 91 %
Patient temperature: 36.8
Patient temperature: 36.3
Patient temperature: 36.2
Patient temperature: 36
Patient temperature: 35.9
Patient temperature: 36
Potassium: 4.3 mmol/L (ref 3.5–5.1)
Potassium: 4.3 mmol/L (ref 3.5–5.1)
Potassium: 4.2 mmol/L (ref 3.5–5.1)
Potassium: 4 mmol/L (ref 3.5–5.1)
Potassium: 3.9 mmol/L (ref 3.5–5.1)
Potassium: 3.9 mmol/L (ref 3.5–5.1)
Potassium: 4.1 mmol/L (ref 3.5–5.1)
Sodium: 147 mmol/L — ABNORMAL HIGH (ref 135–145)
Sodium: 148 mmol/L — ABNORMAL HIGH (ref 135–145)
Sodium: 147 mmol/L — ABNORMAL HIGH (ref 135–145)
Sodium: 147 mmol/L — ABNORMAL HIGH (ref 135–145)
Sodium: 149 mmol/L — ABNORMAL HIGH (ref 135–145)
Sodium: 149 mmol/L — ABNORMAL HIGH (ref 135–145)
Sodium: 148 mmol/L — ABNORMAL HIGH (ref 135–145)
TCO2: 35 mmol/L — ABNORMAL HIGH (ref 22–32)
TCO2: 35 mmol/L — ABNORMAL HIGH (ref 22–32)
TCO2: 35 mmol/L — ABNORMAL HIGH (ref 22–32)
TCO2: 35 mmol/L — ABNORMAL HIGH (ref 22–32)
TCO2: 39 mmol/L — ABNORMAL HIGH (ref 22–32)
TCO2: 40 mmol/L — ABNORMAL HIGH (ref 22–32)
TCO2: 40 mmol/L — ABNORMAL HIGH (ref 22–32)
pCO2 arterial: 39.6 mmHg (ref 32.0–48.0)
pCO2 arterial: 44 mmHg (ref 32.0–48.0)
pCO2 arterial: 41.6 mmHg (ref 32.0–48.0)
pCO2 arterial: 44.5 mmHg (ref 32.0–48.0)
pCO2 arterial: 46.6 mmHg (ref 32.0–48.0)
pCO2 arterial: 49 mmHg — ABNORMAL HIGH (ref 32.0–48.0)
pCO2 arterial: 57.8 mmHg — ABNORMAL HIGH (ref 32.0–48.0)
pH, Arterial: 7.543 — ABNORMAL HIGH (ref 7.350–7.450)
pH, Arterial: 7.494 — ABNORMAL HIGH (ref 7.350–7.450)
pH, Arterial: 7.513 — ABNORMAL HIGH (ref 7.350–7.450)
pH, Arterial: 7.487 — ABNORMAL HIGH (ref 7.350–7.450)
pH, Arterial: 7.504 — ABNORMAL HIGH (ref 7.350–7.450)
pH, Arterial: 7.499 — ABNORMAL HIGH (ref 7.350–7.450)
pH, Arterial: 7.427 (ref 7.350–7.450)
pO2, Arterial: 71 mmHg — ABNORMAL LOW (ref 83.0–108.0)
pO2, Arterial: 87 mmHg (ref 83.0–108.0)
pO2, Arterial: 77 mmHg — ABNORMAL LOW (ref 83.0–108.0)
pO2, Arterial: 55 mmHg — ABNORMAL LOW (ref 83.0–108.0)
pO2, Arterial: 63 mmHg — ABNORMAL LOW (ref 83.0–108.0)
pO2, Arterial: 57 mmHg — ABNORMAL LOW (ref 83.0–108.0)
pO2, Arterial: 60 mmHg — ABNORMAL LOW (ref 83.0–108.0)

## 2020-01-21 LAB — BASIC METABOLIC PANEL
Anion gap: 15 (ref 5–15)
Anion gap: 15 (ref 5–15)
BUN: 87 mg/dL — ABNORMAL HIGH (ref 6–20)
BUN: 84 mg/dL — ABNORMAL HIGH (ref 6–20)
CO2: 28 mmol/L (ref 22–32)
CO2: 32 mmol/L (ref 22–32)
Calcium: 8.6 mg/dL — ABNORMAL LOW (ref 8.9–10.3)
Calcium: 9.3 mg/dL (ref 8.9–10.3)
Chloride: 104 mmol/L (ref 98–111)
Chloride: 102 mmol/L (ref 98–111)
Creatinine, Ser: 1.22 mg/dL — ABNORMAL HIGH (ref 0.44–1.00)
Creatinine, Ser: 1.16 mg/dL — ABNORMAL HIGH (ref 0.44–1.00)
GFR calc Af Amer: 58 mL/min — ABNORMAL LOW (ref >60)
GFR calc Af Amer: >60 mL/min (ref >60)
GFR calc non Af Amer: 50 mL/min — ABNORMAL LOW (ref >60)
GFR calc non Af Amer: 53 mL/min — ABNORMAL LOW (ref >60)
Glucose, Bld: 155 mg/dL — ABNORMAL HIGH (ref 70–99)
Glucose, Bld: 160 mg/dL — ABNORMAL HIGH (ref 70–99)
Potassium: 4.1 mmol/L (ref 3.5–5.1)
Potassium: 3.9 mmol/L (ref 3.5–5.1)
Sodium: 147 mmol/L — ABNORMAL HIGH (ref 135–145)
Sodium: 149 mmol/L — ABNORMAL HIGH (ref 135–145)

## 2020-01-21 LAB — CBC
HCT: 34.9 % — ABNORMAL LOW (ref 36.0–46.0)
HCT: 31.4 % — ABNORMAL LOW (ref 36.0–46.0)
Hemoglobin: 11 g/dL — ABNORMAL LOW (ref 12.0–15.0)
Hemoglobin: 9.6 g/dL — ABNORMAL LOW (ref 12.0–15.0)
MCH: 29.2 pg (ref 26.0–34.0)
MCH: 28.3 pg (ref 26.0–34.0)
MCHC: 31.5 g/dL (ref 30.0–36.0)
MCHC: 30.6 g/dL (ref 30.0–36.0)
MCV: 92.6 fL (ref 80.0–100.0)
MCV: 92.6 fL (ref 80.0–100.0)
Platelets: 32 10*3/uL — ABNORMAL LOW (ref 150–400)
Platelets: 30 10*3/uL — ABNORMAL LOW (ref 150–400)
RBC: 3.77 MIL/uL — ABNORMAL LOW (ref 3.87–5.11)
RBC: 3.39 MIL/uL — ABNORMAL LOW (ref 3.87–5.11)
RDW: 17 % — ABNORMAL HIGH (ref 11.5–15.5)
RDW: 16.4 % — ABNORMAL HIGH (ref 11.5–15.5)
WBC: 10.2 10*3/uL (ref 4.0–10.5)
WBC: 6.7 10*3/uL (ref 4.0–10.5)
nRBC: 0.7 % — ABNORMAL HIGH (ref 0.0–0.2)
nRBC: 0.7 % — ABNORMAL HIGH (ref 0.0–0.2)

## 2020-01-21 LAB — LACTATE DEHYDROGENASE: LDH: 585 U/L — ABNORMAL HIGH (ref 98–192)

## 2020-01-21 LAB — BPAM RBC
Blood Product Expiration Date: 202109122359
Blood Product Expiration Date: 202109122359
Blood Product Expiration Date: 202109122359
Blood Product Expiration Date: 202109122359
Unit Type and Rh: 5100
Unit Type and Rh: 5100
Unit Type and Rh: 5100
Unit Type and Rh: 5100

## 2020-01-21 LAB — HEPATIC FUNCTION PANEL
ALT: 88 U/L — ABNORMAL HIGH (ref 0–44)
AST: 93 U/L — ABNORMAL HIGH (ref 15–41)
Albumin: 2.7 g/dL — ABNORMAL LOW (ref 3.5–5.0)
Alkaline Phosphatase: 167 U/L — ABNORMAL HIGH (ref 38–126)
Bilirubin, Direct: 0.7 mg/dL — ABNORMAL HIGH (ref 0.0–0.2)
Indirect Bilirubin: 1.8 mg/dL — ABNORMAL HIGH (ref 0.3–0.9)
Total Bilirubin: 2.5 mg/dL — ABNORMAL HIGH (ref 0.3–1.2)
Total Protein: 5.1 g/dL — ABNORMAL LOW (ref 6.5–8.1)

## 2020-01-21 LAB — GLUCOSE, CAPILLARY
Glucose-Capillary: 147 mg/dL — ABNORMAL HIGH (ref 70–99)
Glucose-Capillary: 162 mg/dL — ABNORMAL HIGH (ref 70–99)
Glucose-Capillary: 166 mg/dL — ABNORMAL HIGH (ref 70–99)
Glucose-Capillary: 231 mg/dL — ABNORMAL HIGH (ref 70–99)
Glucose-Capillary: 233 mg/dL — ABNORMAL HIGH (ref 70–99)

## 2020-01-21 LAB — PROTIME-INR
INR: 1.5 — ABNORMAL HIGH (ref 0.8–1.2)
Prothrombin Time: 17.9 seconds — ABNORMAL HIGH (ref 11.4–15.2)

## 2020-01-21 LAB — FIBRINOGEN: Fibrinogen: 105 mg/dL — ABNORMAL LOW (ref 210–475)

## 2020-01-21 LAB — LACTIC ACID, PLASMA: Lactic Acid, Venous: 1.6 mmol/L (ref 0.5–1.9)

## 2020-01-21 MED ORDER — ALBUMIN HUMAN 25 % IV SOLN
50.0000 g | Freq: Four times a day (QID) | INTRAVENOUS | Status: AC
  Administered 2020-01-21: 50 g via INTRAVENOUS
  Administered 2020-01-21: 12.5 g via INTRAVENOUS
  Administered 2020-01-22: 50 g via INTRAVENOUS
  Filled 2020-01-21 (×3): qty 200

## 2020-01-21 MED ORDER — ALBUMIN HUMAN 25 % IV SOLN
25.0000 g | Freq: Once | INTRAVENOUS | Status: AC
  Administered 2020-01-21: 12.5 g via INTRAVENOUS
  Filled 2020-01-21: qty 100

## 2020-01-21 MED ORDER — METOLAZONE 5 MG PO TABS
5.0000 mg | ORAL_TABLET | Freq: Once | ORAL | Status: DC
  Filled 2020-01-21: qty 1

## 2020-01-21 NOTE — Progress Notes (Signed)
STROKE TEAM PROGRESS NOTE         INTERVAL HISTORY Patient remains critically ill with severe Covid pneumonia and Enterococcus sepsis requiring ECMO for oxygenation.  She has been off sedation now and is arousable and follows simple midline commands and moves all 4 extremities purposefully but appears to have left-sided weakness.   OBJECTIVE Vitals:   01/21/20 0615 01/21/20 0630 01/21/20 0645 01/21/20 0700  BP:    106/61  Pulse: (!) 55 (!) 49 (!) 57 (!) 52  Resp: (!) 7 (!) 0 (!) 0 (!) 0  Temp: (!) 97.2 F (36.2 C) (!) 97.3 F (36.3 C) (!) 97.2 F (36.2 C) (!) 97.2 F (36.2 C)  TempSrc:      SpO2: 96% 94% 96% 96%  Weight:      Height:        CBC:  Recent Labs  Lab 01/20/20 1653 01/20/20 1707 01/21/20 0355 01/21/20 0355 01/21/20 0509 01/21/20 0754  WBC 11.8*  --  10.2  --   --   --   HGB 10.7*   < > 11.0*   < > 10.2* 10.2*  HCT 34.9*   < > 34.9*   < > 30.0* 30.0*  MCV 91.8  --  92.6  --   --   --   PLT 30*  --  32*  --   --   --    < > = values in this interval not displayed.    Basic Metabolic Panel:  Recent Labs  Lab 01/16/20 0343 01/16/20 0558 01/16/20 1100 01/16/20 1159 01/18/20 1700 01/18/20 1953 01/20/20 1653 01/20/20 1707 01/21/20 0355 01/21/20 0355 01/21/20 0509 01/21/20 0754  NA 155*   < > 154*   < > 149*   < > 145   < > 147*   < > 148* 147*  K 4.3   < > 5.7*   < > 5.4*   < > 4.8   < > 4.1   < > 4.3 4.2  CL 112*   < > 113*   < > 113*   < > 106  --  104  --   --   --   CO2 35*   < > 34*   < > 26   < > 28  --  28  --   --   --   GLUCOSE 168*   < > 181*   < > 336*   < > 209*  --  155*  --   --   --   BUN 72*   < > 84*   < > 108*   < > 95*  --  87*  --   --   --   CREATININE 0.74   < > 1.07*   < > 1.19*   < > 1.21*  --  1.22*  --   --   --   CALCIUM 8.4*   < > 8.1*   < > 8.3*   < > 8.5*  --  8.6*  --   --   --   MG 3.1*   < > 2.9*  --  2.7*  --   --   --   --   --   --   --   PHOS 2.3*  --  4.7*  --   --   --   --   --   --   --   --   --    < >  =  values in this interval not displayed.    Lipid Panel:     Component Value Date/Time   CHOL 177 09/04/2019 1522   TRIG 341 (H) 01/10/2020 0346   HDL 32.80 (L) 09/04/2019 1522   CHOLHDL 5 09/04/2019 1522   VLDL 55.4 (H) 09/04/2019 1522   LDLCALC 79 07/05/2017 0908   HgbA1c:  Lab Results  Component Value Date   HGBA1C 10.7 (H) 02/01/2020   Urine Drug Screen: No results found for: LABOPIA, COCAINSCRNUR, LABBENZ, AMPHETMU, THCU, LABBARB  Alcohol Level No results found for: ETH  IMAGING  CT HEAD WO CONTRAST 01/20/2020 IMPRESSION:  No substantial change in multifocal parenchymal hemorrhages. Regional mass effect remains mild. No new hemorrhage.   CT HEAD WO CONTRAST 01/19/2020 IMPRESSION:  Multifocal parenchymal hemorrhages, with the largest within the right parietal lobe. Together with associated edema, there is mild regional mass effect but no herniation. Considerations include sequelae of ECMO anticoagulation, hemorrhagic conversion of embolic infarcts, and vasculitis/vasculopathy.   CT CHEST ABDOMEN PELVIS W CONTRAST 01/19/2020 IMPRESSION:  1. Dense airspace opacity throughout both lungs with air bronchograms, compatible with the clinical diagnosis of COVID ARDS. Dense bilateral pneumonia could also have this appearance.  2. Small to moderate-sized right pleural effusion and small left pleural effusion.  3. Small amount of ascites.  4. Colonic diverticulosis.   DG CHEST PORT 1 VIEW 01/20/2020 IMPRESSION:  Stable appearance of the chest when compared with the prior exam.  DG CHEST PORT 1 VIEW 01/19/2020 IMPRESSION:  Persistent widespread airspace opacity with underlying pleural effusions. Tube and catheter positions as described without pneumothorax. Stable cardiac silhouette.   US Abdomen Limited RUQ 01/18/2020 IMPRESSION:  1. No acute findings.  2. No bile duct dilatation.  3. Fatty infiltration of the liver.  4. Status post cholecystectomy.   Transthoracic  Echocardiogram  01/16/2020 IMPRESSIONS  1. Left ventricular ejection fraction, by estimation, is >75%. The left  ventricle has hyperdynamic function. The left ventricle has no regional  wall motion abnormalities. Left ventricular diastolic function could not  be evaluated.  2. Right ventricular systolic function is hyperdynamic. The right  ventricular size is normal. Tricuspid regurgitation signal is inadequate  for assessing PA pressure.  3. The mitral valve is grossly normal. No evidence of mitral valve  regurgitation.  4. The aortic valve was not well visualized. Aortic valve regurgitation  is not visualized.  5. The inferior vena cava is normal in size with <50% respiratory  variability, suggesting right atrial pressure of 8 mmHg.   ECG - ST rate 132 BPM. (See cardiology reading for complete details)  PHYSICAL EXAM Blood pressure 106/61, pulse (!) 52, temperature (!) 97.2 F (36.2 C), resp. rate (!) 0, height 5\' 4"  (1.626 m), weight (!) 136.9 kg, SpO2 96 %. Obese middle-aged Caucasian lady who is intubated and mildly sedated. Afebrile. Head is nontraumatic. Neck is supple without bruit.    Cardiac exam no murmur or gallop. Lungs are clear to auscultation. Distal pulses are well felt. Neurological Exam : Patient is intubated and presently off sedation..  She opens eyes to verbal stimuli.  Eyes are disconjugate with left eyes slight hypertropia.  She follows gaze in all directions with slight limitation of upgaze.  She blinks to threat on both sides pupils 3 mm sluggishly reactive.    Weak cough and gag.  No spontaneous extremity movements but will wiggle toes and fingers to command in all 4 extremities.  Movements in left hand and leg appear less than and right arm  and leg.   Plantars are equivocal bilaterally.   ASSESSMENT/PLAN Alisha Stephenson is a 54 y.o. female with history of diabetes and morbid obesity admitted for Covid pneumonia.  She developed encephalopathy and a CT of  the head revealed multifocal ICHs with largest within the right frontoparietal area.  She did not receive IV t-PA due to [place].  Multifocal parenchymal hemorrhages.  Resultant mild left hemiparesis  CT head - 01/20/20 - multifocal parenchymal hemorrhages.  MRI head - not ordered  MRA head - not ordered   CTA H&N - not ordered  CT Perfusion - not ordered  Carotid Doppler - not ordered  2D Echo - EF > 75 %. No cardiac source of emboli identified.   Sars Corona Virus 2 - positive  LDL - triglycerides 341  HgbA1c - 10.7  UDS - not ordered  VTE prophylaxis - SCDs Diet  Diet Order            Diet NPO time specified  Diet effective now                 No antithrombotic prior to admission, now on No antithrombotic due to intracerebral hemorrhage and low platelet count  Therapy recommendations:  pending  Disposition:  Pending  Hypertension  Home BP meds: none   Current BP meds: Levophed  Unstable . Long-term BP goal normotensive  Hyperlipidemia  Home Lipid lowering medication: none   LDL triglycerides 341, goal < 70  Current lipid lowering medication:  None (statin contraindicated with ICH)  Continue statin at discharge  Diabetes  Home diabetic meds: insulin  Current diabetic meds: Tradjenta and insulin  HgbA1c 10.7, goal < 7.0 Recent Labs    01/20/20 2239 01/21/20 0010 01/21/20 0327  GLUCAP 152* 147* 166*    Other Stroke Risk Factors  Former cigarette smoker - quit  Obesity, Body mass index is 51.81 kg/m., recommend weight loss, diet and exercise as appropriate   Other Active Problems  Code status - Limited code   Palliative Care Consult - 01/20/20 -> Family is not ready to transition to comfort care at this time.   Summary of Recommendations From Palliative Care:  No CPR/ACLS in the event of cardiac arrest   Family will need ongoing support   In the event of rapid decline, please allow for 3 visitors at bedside (per visitation  guidelines)  Palliative Care will follow-up with family  Sunday  Anemia - Hgb - 9.9->10.2  Multi organ system failure  AKI - creatinine - 1.21->1.22  Intubated / ECMO  Bradycardia   Hospital day # 13 Patient has developed multiple intracerebral hemorrhages in the setting of severe Covid infection, enterococcal sepsis and usage of anticoagulation for ECMO.  Exact etiology of the hemorrhage is unclear but likely due to combination of the above.  Her neurological prognosis is guarded but she is likely to have some residual cognitive deficits from this but her primary prognosis is likely driven by his severe Covid infection and refractory hypoxia requiring ECMO.  Patient will likely need anticoagulation for efficient use of ECMO but this will carry risk of increasing her existing brain hemorrhages and putting her at risk for future hemorrhages as well.  Since patient's survival depends on the ECMO which is not likely to function efficiently without anticoagulation we may have to no choice but to resume anticoagulation.  Recommend check CT scan of the head tomorrow if hemorrhage is stable may resume lowest possible dose of anticoagulation.  Long discussion  with Dr. Haroldine Laws and Dr. [name] and answered questions.  I am available to talk to family if needed This patient is critically ill and at significant risk of neurological worsening, death and care requires constant monitoring of vital signs, hemodynamics,respiratory and cardiac monitoring, extensive review of multiple databases, frequent neurological assessment, discussion with family, other specialists and medical decision making of high complexity.I have made any additions or clarifications directly to the above note.This critical care time does not reflect procedure time, or teaching time or supervisory time of PA/NP/Med Resident etc but could involve care discussion time.  I spent 30 minutes of neurocritical care time  in the care of  this  patient.   Antony Contras, MD     To contact Stroke Continuity provider, please refer to [email or web address] After hours, contact General Neurology

## 2020-01-21 NOTE — Progress Notes (Signed)
Patient ECMO flows and O2 saturation dropped after being turned for her bath. ECMO circuit had moderate chattering during the episode. Flows were decreased and patient was given 2 albumin's.  ECMO flow returned to 4.3 on 3600 RPM's with minimal chattering.

## 2020-01-21 NOTE — Progress Notes (Signed)
Advanced Heart Failure Rounding Note   Subjective:    - 8/9 Cannulated for VV ECMO - 8/13 with several areas of intracranial hemorrhage. Bival stopped.  - 8/14 CT no change in [place]. Increased edema  Remains intubated/sedated on vent. On VV ECMO. Per RN, overnight she awoke and followed commands but pupils were dysconjugate. Began stacking on vent. Sedation increased. This am sedation lightened. Awake on vent. Will follow commands. Weak on left.   On high-dose zosyn and vanc for enterococcus in blood and MSSA in sputum   Diuresed aggressively on lasix gtt. -6.2L  Got 4 albumins. Down 15 pounds.   CXR worsening infiltrates in setting of lower RVs  ECMO   Flow 4.3L RPM 3600 Sweep6.5  Labs:  7.51/42/77/97% on FiO2 80% TV 50 Hgb11.0 PLT 33 -> 32 LDH 608 (pre-cannulation) -> 558 -> 543 -> 501  -> 623 -> 1,110 -> 734 -> 585 PTT => off bival Lactic acid2.7 -> 2.5 -> 2.8-> 2.1 -> 3.3 -> 2.1-> 1.6  Objective:   Weight Range:  Vital Signs:   Temp:  [95.7 F (35.4 C)-98.1 F (36.7 C)] 97.2 F (36.2 C) (08/15 0700) Pulse Rate:  [44-81] 52 (08/15 0700) Resp:  [0-23] 0 (08/15 0700) BP: (86-148)/(49-90) 106/61 (08/15 0700) SpO2:  [91 %-100 %] 96 % (08/15 0700) Arterial Line BP: (83-172)/(46-76) 135/64 (08/15 0700) FiO2 (%):  [80 %] 80 % (08/15 0329) Weight:  [136.9 kg] 136.9 kg (08/15 0356) Last BM Date: 01/20/20  Weight change: Filed Weights   01/19/20 0500 01/20/20 0404 01/21/20 0356  Weight: (!) 138.4 kg (!) 143.7 kg (!) 136.9 kg    Intake/Output:   Intake/Output Summary (Last 24 hours) at 01/21/2020 1012 Last data filed at 01/21/2020 0900 Gross per 24 hour  Intake 1753.13 ml  Output 9325 ml  Net -7571.87 ml     Physical Exam: General:  Intubated/sedated will awaken and follow commands  HEENT: normal left eye dysconguation  Neck: supple. RIJ ECMO LIJ TLC  Cor: PMI nondisplaced. Regular rate & rhythm. No rubs, gallops or murmurs. Lungs:  clear Abdomen: obese soft, nontender, nondistended. No hepatosplenomegaly. No bruits or masses. Good bowel sounds. Extremities: no cyanosis, clubbing, rash, 1-2+ edema Neuro: awake on vent will follow commands weak on left  Telemetry: Sinus 60-70s + PACs Personally reviewed    Labs: Basic Metabolic Panel: Recent Labs  Lab 01/16/20 0343 01/16/20 0558 01/16/20 1100 01/16/20 1159 01/18/20 1700 01/18/20 1953 01/19/20 0842 01/19/20 0844 01/19/20 1655 01/19/20 2026 01/20/20 0347 01/20/20 0616 01/20/20 1653 01/20/20 1707 01/20/20 2025 01/21/20 0249 01/21/20 0355 01/21/20 0509 01/21/20 0754  NA 155*   < > 154*   < > 149*   < > 149*   < > 146*   < > 147*   < > 145   < > 145 147* 147* 148* 147*  K 4.3   < > 5.7*   < > 5.4*   < > 4.4   < > 4.1   < > 4.6   < > 4.8   < > 6.3* 4.3 4.1 4.3 4.2  CL 112*   < > 113*   < > 113*   < > 112*  --  109  --  110  --  106  --   --   --  104  --   --   CO2 35*   < > 34*   < > 26   < > 27  --  26  --  28  --  28  --   --   --  28  --   --   GLUCOSE 168*   < > 181*   < > 336*   < > 402*  --  241*  --  171*  --  209*  --   --   --  155*  --   --   BUN 72*   < > 84*   < > 108*   < > 105*  --  99*  --  94*  --  95*  --   --   --  87*  --   --   CREATININE 0.74   < > 1.07*   < > 1.19*   < > 1.25*  --  1.12*  --  1.21*  --  1.21*  --   --   --  1.22*  --   --   CALCIUM 8.4*   < > 8.1*   < > 8.3*   < > 8.0*   < > 7.8*   < > 8.1*  --  8.5*  --   --   --  8.6*  --   --   MG 3.1*  --  2.9*  --  2.7*  --   --   --   --   --   --   --   --   --   --   --   --   --   --   PHOS 2.3*  --  4.7*  --   --   --   --   --   --   --   --   --   --   --   --   --   --   --   --    < > = values in this interval not displayed.    Liver Function Tests: Recent Labs  Lab 01/17/20 0351 01/18/20 0355 01/19/20 0313 01/20/20 0347 01/21/20 0355  AST 58* 50* 147* 97* 93*  ALT 59* 54* 108* 104* 88*  ALKPHOS 92 342* 214* 189* 167*  BILITOT 1.2 1.3* 2.0* 1.7* 2.5*  PROT  4.5* 4.7* 4.4* 4.6* 5.1*  ALBUMIN 3.0* 3.1* 2.7* 2.5* 2.7*   No results for input(s): LIPASE, AMYLASE in the last 168 hours. No results for input(s): AMMONIA in the last 168 hours.  CBC: Recent Labs  Lab 01/19/20 0842 01/19/20 0844 01/19/20 1655 01/19/20 2026 01/20/20 0347 01/20/20 0616 01/20/20 1653 01/20/20 1707 01/20/20 2025 01/21/20 0249 01/21/20 0355 01/21/20 0509 01/21/20 0754  WBC 10.8*  --  8.4  --  12.8*  --  11.8*  --   --   --  10.2  --   --   HGB 10.3*   < > 9.8*   < > 10.7*   < > 10.7*   < > 11.2* 10.9* 11.0* 10.2* 10.2*  HCT 33.2*   < > 31.7*   < > 33.7*   < > 34.9*   < > 33.0* 32.0* 34.9* 30.0* 30.0*  MCV 94.1  --  92.7  --  92.8  --  91.8  --   --   --  92.6  --   --   PLT 27*  --  24*  --  33*  --  30*  --   --   --  32*  --   --    < > = values in this interval  not displayed.    Cardiac Enzymes: No results for input(s): CKTOTAL, CKMB, CKMBINDEX, TROPONINI in the last 168 hours.  BNP: BNP (last 3 results) No results for input(s): BNP in the last 8760 hours.  ProBNP (last 3 results) No results for input(s): PROBNP in the last 8760 hours.    Other results:  Imaging: CT HEAD WO CONTRAST  Result Date: 01/20/2020 CLINICAL DATA:  Intracranial hemorrhage, follow-up EXAM: CT HEAD WITHOUT CONTRAST TECHNIQUE: Contiguous axial images were obtained from the base of the skull through the vertex without intravenous contrast. COMPARISON:  01/19/2020 FINDINGS: Brain: Multifocal parenchymal hemorrhage is again identified. As noted previously, greatest involvement in the biparietal region with the largest area of hemorrhage on the right measuring up to 3.7 cm as before. There is mild edema associated with the areas of hemorrhage without substantial mass effect. No new hemorrhage is identified. Gray-white differentiation remains preserved. Ventricles stable in size. Vascular: No new finding. Skull: Calvarium is unremarkable. Sinuses/Orbits: No acute finding. Other:  Partially imaged facial subcutaneous edema. IMPRESSION: No substantial change in multifocal parenchymal hemorrhages. Regional mass effect remains mild. No new hemorrhage. Electronically Signed   By: Macy Mis M.D.   On: 01/20/2020 11:30   CT HEAD WO CONTRAST  Result Date: 01/19/2020 CLINICAL DATA:  Mental status change, COVID positive, on ECMO EXAM: CT HEAD WITHOUT CONTRAST TECHNIQUE: Contiguous axial images were obtained from the base of the skull through the vertex without intravenous contrast. COMPARISON:  01/06/2020 FINDINGS: Brain: Multifocal areas of parenchymal hemorrhage identified bilaterally with greatest involvement of the parietal lobes. The largest area the right parietal lobe measures approximately 3.8 x 2.3 cm. There is edema associated with these hemorrhages causing mild regional mass effect. No intraventricular extension. No hydrocephalus. Gray-white differentiation is preserved. Vascular: No hyperdense vessel or unexpected calcification. Skull: Calvarium is unremarkable. Sinuses/Orbits: Nonspecific extensive paranasal sinus opacification. Other: Nonspecific mastoid and middle ear opacification. Partially imaged endotracheal and enteric tubes. IMPRESSION: Multifocal parenchymal hemorrhages, with the largest within the right parietal lobe. Together with associated edema, there is mild regional mass effect but no herniation. Considerations include sequelae of ECMO anticoagulation, hemorrhagic conversion of embolic infarcts, and vasculitis/vasculopathy. These results were called by telephone at the time of interpretation on 01/19/2020 at 2:41 pm to provider Noemi Chapel , who verbally acknowledged these results. Electronically Signed   By: Macy Mis M.D.   On: 01/19/2020 14:45   CT CHEST ABDOMEN PELVIS W CONTRAST  Result Date: 01/19/2020 CLINICAL DATA:  Sepsis. Leukocytosis. Respiratory failure. COVID ARDS. EXAM: CT CHEST, ABDOMEN, AND PELVIS WITH CONTRAST TECHNIQUE: Multidetector CT  imaging of the chest, abdomen and pelvis was performed following the standard protocol during bolus administration of intravenous contrast. CONTRAST:  120m OMNIPAQUE IOHEXOL 350 MG/ML SOLN COMPARISON:  Abdomen and pelvis CT dated 05/10/2014, portable chest obtained earlier today. FINDINGS: CT CHEST FINDINGS Cardiovascular: The right jugular ECMO catheter extends through the superior vena cava and into the inferior vena cava with its tip above the level of the renal veins. Mildly enlarged heart. Minimal pericardial fluid with a maximum thickness of 5 mm. Mediastinum/Nodes: Endotracheal tube tip 1 cm above the carina. Feeding tube extending into the stomach. Unremarkable included thyroid gland. No enlarged lymph nodes. Lungs/Pleura: Dense airspace opacity throughout both lungs with air bronchograms. Small to moderate-sized right pleural effusion and small left pleural effusion. Musculoskeletal: Thoracic spine degenerative changes. Lower cervical spine fixation hardware. CT ABDOMEN PELVIS FINDINGS Hepatobiliary: No focal liver abnormality is seen. Status post cholecystectomy. No biliary dilatation.  Pancreas: Unremarkable. No pancreatic ductal dilatation or surrounding inflammatory changes. Spleen: Normal in size without focal abnormality. Adrenals/Urinary Tract: Foley catheter in the urinary bladder with no urine in the bladder. There is associated air in the bladder. Normal appearing adrenal glands, kidneys and ureters. Stomach/Bowel: Large number of sigmoid and distal descending colon diverticula without evidence of diverticulitis. Normal appearing appendix, small bowel and stomach. Feeding tube tip in the mid to distal stomach. Rectal balloon catheter. Vascular/Lymphatic: No significant vascular findings are present. No enlarged abdominal or pelvic lymph nodes. Reproductive: Status post hysterectomy. No adnexal masses. Other: Mild bilateral subcutaneous edema. Small amount of free peritoneal fluid. Musculoskeletal:  Lumbar spine degenerative changes. IMPRESSION: 1. Dense airspace opacity throughout both lungs with air bronchograms, compatible with the clinical diagnosis of COVID ARDS. Dense bilateral pneumonia could also have this appearance. 2. Small to moderate-sized right pleural effusion and small left pleural effusion. 3. Small amount of ascites. 4. Colonic diverticulosis. Electronically Signed   By: Claudie Revering M.D.   On: 01/19/2020 15:23   DG CHEST PORT 1 VIEW  Result Date: 01/21/2020 CLINICAL DATA:  COVID-19 positivity with ECMO EXAM: PORTABLE CHEST 1 VIEW COMPARISON:  01/20/2020 FINDINGS: Endotracheal tube and left jugular central line is well as ECMO cannula are again identified. Right-sided PICC line is seen and stable. Feeding catheter has been removed in the interval. Increasing opacity is noted throughout both lungs consistent with progression of consolidation. IMPRESSION: Increasing opacity in the lungs bilaterally. Tubes and lines as described. Electronically Signed   By: Inez Catalina M.D.   On: 01/21/2020 07:17   DG CHEST PORT 1 VIEW  Result Date: 01/20/2020 CLINICAL DATA:  Respiratory failure, COVID-19 positivity and ECMO EXAM: PORTABLE CHEST 1 VIEW COMPARISON:  01/19/2020 FINDINGS: Cardiac shadow is stable. Left jugular central line, endotracheal tube and feeding catheter are noted in satisfactory position. ECMO cannulas are noted and stable. Diffuse bilateral airspace opacities are noted consistent with the given clinical history. IMPRESSION: Stable appearance of the chest when compared with the prior exam. Electronically Signed   By: Inez Catalina M.D.   On: 01/20/2020 08:27     Medications:     Scheduled Medications: . sodium chloride   Intravenous Once  . artificial tears  1 application Both Eyes O6Z  . vitamin C  500 mg Per Tube Daily  . chlorhexidine gluconate (MEDLINE KIT)  15 mL Mouth Rinse BID  . Chlorhexidine Gluconate Cloth  6 each Topical Daily  . clonazePAM  1 mg Per Tube BID   . docusate  100 mg Per Tube BID  . feeding supplement (PROSource TF)  45 mL Per Tube BID  . fentaNYL (SUBLIMAZE) injection  50 mcg Intravenous Once  . free water  300 mL Per Tube Q2H  . insulin aspart  3-9 Units Subcutaneous Q4H  . insulin detemir  38 Units Subcutaneous Q12H  . linagliptin  5 mg Per Tube Daily  . mouth rinse  15 mL Mouth Rinse 10 times per day  . methylPREDNISolone (SOLU-MEDROL) injection  40 mg Intravenous Daily  . nutrition supplement (JUVEN)  1 packet Per Tube BID BM  . oxyCODONE  5 mg Per Tube Q6H  . pantoprazole sodium  40 mg Per Tube Daily  . polyethylene glycol  17 g Per Tube Daily  . sennosides  5 mL Per Tube BID  . sodium chloride flush  10-40 mL Intracatheter Q12H  . zinc sulfate  220 mg Per Tube Daily    Infusions: . sodium  chloride 250 mL (01/20/20 0609)  . sodium chloride Stopped (01/26/2020 1009)  . albumin human 12.5 g (01/21/20 0455)  . dexmedetomidine (PRECEDEX) IV infusion 1.2 mcg/kg/hr (01/21/20 0612)  . dextrose 5 % and 0.45% NaCl Stopped (01/26/2020 0827)  . feeding supplement (PIVOT 1.5 CAL) 1,000 mL (01/20/20 0102)  . fentaNYL infusion INTRAVENOUS 400 mcg/hr (01/21/20 0600)  . furosemide (LASIX) infusion 8 mg/hr (01/21/20 0600)  . insulin Stopped (01/21/20 0121)  . midazolam Stopped (01/19/20 1120)  . norepinephrine (LEVOPHED) Adult infusion 1 mcg/min (01/21/20 7371)  . piperacillin-tazobactam (ZOSYN)  IV Stopped (01/21/20 0517)  . vancomycin 1,000 mg (01/21/20 0802)    PRN Medications: sodium chloride, acetaminophen (TYLENOL) oral liquid 160 mg/5 mL, albumin human, albuterol, dextrose, fentaNYL, fentaNYL, guaiFENesin-dextromethorphan, hydrALAZINE, hydrOXYzine, midazolam, ondansetron **OR** ondansetron (ZOFRAN) IV, sodium chloride flush   Assessment/Plan:   1. Acute hypoxic/hypercapneic respiratory failure in setting of severe COVID PNA/ARDS -> VV ECMO - admit 8/2 - intubation 8/3 - has received actmera (compelted 8/2), remdesivir  (completed 8/6) and steroids - failed full vent support with proning/paralytic - Cannulated for VV ECMO on 8/9 - TVs down and CXR worse in setting of increased sedation. Will lighten sedation as tolerated - Improved response to IV lasix with addition of metolazone. Remains markedly volume overloaded. Will continue lasix gtt. Repeat metolazone. Give albumin as needed - Off bival due to [place]. Watch circuit closely for clotting. D/w Neurology. Would not be a candidate for any AC for weeks to months given size of ICH - Elevated LDH likely due to sepsis. Now coming down  - Remains very tenuous with MSSA PNA, enterococcus sepsis, persistent volume overload and now ICH. Family aware of gravity of situation. Want to maintain all interventions for now.   - With need to stop [facility] we discussed possibility of coming off ECMO but doubt she would survive without ECMO support at this point  - Given underlying OHS will allow pCO2 to drift up a bit to relieve acidosis. Likely trach this week.   2. Enterococcus sepsis - continue vanc + high dose zosyn per pharmacy   3. Intracranial hemorrhage - ? Septic emboli - repeat head CT on 8/14 with stable bleeds but increased edema - neurology following. Suspect significant long-term injury sustained. Will follow commands. Appears to have dense LUE weakness and possibly LLE - No AC with ICH - PT/OT consult  4. Thrombocytopenia - suspect due to sepsis.  - no obvious bleeding - would not transfuse PLTs with stable bleed and increased risk of circuit clotting  4. Morbid obesity - Body mass index is 49.38 kg/m.  5. Poorly controlled DM2 - HgBA1c 10.7 - CBGs remain elevated - On IV insulin adjust as needed  4. Hypernatremia - improved. Stop free water boluses given ICH and benefit of permissive hypernatremia  5. Lactic acidosis -improved   CRITICAL CARE Performed by: Glori Bickers  Total critical care time: 45 minutes  Critical care time was  exclusive of separately billable procedures and treating other patients.  Critical care was necessary to treat or prevent imminent or life-threatening deterioration.  Critical care was time spent personally by me (independent of midlevel providers or residents) on the following activities: development of treatment plan with patient and/or surrogate as well as nursing, discussions with consultants, evaluation of patient's response to treatment, examination of patient, obtaining history from patient or surrogate, ordering and performing treatments and interventions, ordering and review of laboratory studies, ordering and review of radiographic studies, pulse oximetry and re-evaluation  of patient's condition.   Length of Stay: 13   Glori Bickers  MD 01/21/2020, 10:12 AM  Advanced Heart Failure Team Pager [phone] (M-F; [place])  Please contact [facility] for night-coverage after hours (4p -7a ) and weekends on [email or web address]

## 2020-01-21 NOTE — Progress Notes (Signed)
Advanced Heart Failure Rounding Note   Subjective:    - 8/9 Cannulated for VV ECMO - 8/13 with several areas of intracranial hemorrhage. Bival stopped.  - 8/14 CT no change in ICH. Increased edema  Remains intubated/sedated on vent. On VV ECMO. Per RN, overnight she awoke and followed commands but pupils were dysconjugate. Began stacking on vent. Sedation increased.   On high-dose zosyn and vanc for enterococcus in blood and MSSA in sputum   Diuresed aggressively on lasix gtt. -6.2L  Got 4 albumins. Down 15 pounds.   CXR worsening infiltrates in setting of lower TVs  ECMO   Flow 4.3L RPM 3600 Sweep6.5  Labs:  7.51/42/77/97% on FiO2 80% TV 50 Hgb11.0 PLT 33 -> 32 LDH 608 (pre-cannulation) -> 558 -> 543 -> 501  -> 623 -> 1,110 -> 734 -> 585 PTT => off bival Lactic acid2.7 -> 2.5 -> 2.8-> 2.1 -> 3.3 -> 2.1-> 1.6  Objective:   Weight Range:  Vital Signs:   Temp:  [95.7 F (35.4 C)-99.3 F (37.4 C)] 97.2 F (36.2 C) (08/15 0700) Pulse Rate:  [44-84] 52 (08/15 0700) Resp:  [0-25] 0 (08/15 0700) BP: (86-148)/(49-90) 106/61 (08/15 0700) SpO2:  [91 %-100 %] 96 % (08/15 0700) Arterial Line BP: (83-172)/(46-76) 135/64 (08/15 0700) FiO2 (%):  [80 %] 80 % (08/15 0329) Weight:  [136.9 kg] 136.9 kg (08/15 0356) Last BM Date: 01/20/20  Weight change: Filed Weights   01/19/20 0500 01/20/20 0404 01/21/20 0356  Weight: (!) 138.4 kg (!) 143.7 kg (!) 136.9 kg    Intake/Output:   Intake/Output Summary (Last 24 hours) at 01/21/2020 0849 Last data filed at 01/21/2020 0800 Gross per 24 hour  Intake 2035.76 ml  Output 9475 ml  Net -7439.24 ml     Physical Exam: General:  Intubated/sedated will awake and follow commands  HEENT: normal +ETT dysconjugate gaze Neck: supple. RIJ ECMO cannula LIJ TLC Carotids 2+ bilat; no bruits. No lymphadenopathy or thryomegaly appreciated. Cor: PMI nondisplaced. Regular rate & rhythm. No rubs, gallops or murmurs. Lungs: minimal air  movement  Abdomen: obese soft, nontender, nondistended. No hepatosplenomegaly. No bruits or masses. Good bowel sounds. Extremities: no cyanosis, clubbing, rash  2+ edema Neuro: awake on vent will follow commands seems to weak on LUE>LLE   Telemetry: Sinus 60-70s Personally reviewed    Labs: Basic Metabolic Panel: Recent Labs  Lab 01/16/20 0343 01/16/20 0558 01/16/20 1100 01/16/20 1159 01/18/20 1700 01/18/20 1953 01/19/20 0842 01/19/20 0844 01/19/20 1655 01/19/20 2026 01/20/20 0347 01/20/20 0616 01/20/20 1653 01/20/20 1707 01/20/20 2025 01/21/20 0249 01/21/20 0355 01/21/20 0509 01/21/20 0754  NA 155*   < > 154*   < > 149*   < > 149*   < > 146*   < > 147*   < > 145   < > 145 147* 147* 148* 147*  K 4.3   < > 5.7*   < > 5.4*   < > 4.4   < > 4.1   < > 4.6   < > 4.8   < > 6.3* 4.3 4.1 4.3 4.2  CL 112*   < > 113*   < > 113*   < > 112*  --  109  --  110  --  106  --   --   --  104  --   --   CO2 35*   < > 34*   < > 26   < > 27  --  26  --  28  --  28  --   --   --  28  --   --   GLUCOSE 168*   < > 181*   < > 336*   < > 402*  --  241*  --  171*  --  209*  --   --   --  155*  --   --   BUN 72*   < > 84*   < > 108*   < > 105*  --  99*  --  94*  --  95*  --   --   --  87*  --   --   CREATININE 0.74   < > 1.07*   < > 1.19*   < > 1.25*  --  1.12*  --  1.21*  --  1.21*  --   --   --  1.22*  --   --   CALCIUM 8.4*   < > 8.1*   < > 8.3*   < > 8.0*   < > 7.8*   < > 8.1*  --  8.5*  --   --   --  8.6*  --   --   MG 3.1*  --  2.9*  --  2.7*  --   --   --   --   --   --   --   --   --   --   --   --   --   --   PHOS 2.3*  --  4.7*  --   --   --   --   --   --   --   --   --   --   --   --   --   --   --   --    < > = values in this interval not displayed.    Liver Function Tests: Recent Labs  Lab 01/17/20 0351 01/18/20 0355 01/19/20 0313 01/20/20 0347 01/21/20 0355  AST 58* 50* 147* 97* 93*  ALT 59* 54* 108* 104* 88*  ALKPHOS 92 342* 214* 189* 167*  BILITOT 1.2 1.3* 2.0* 1.7* 2.5*   PROT 4.5* 4.7* 4.4* 4.6* 5.1*  ALBUMIN 3.0* 3.1* 2.7* 2.5* 2.7*   No results for input(s): LIPASE, AMYLASE in the last 168 hours. No results for input(s): AMMONIA in the last 168 hours.  CBC: Recent Labs  Lab 01/19/20 0842 01/19/20 0844 01/19/20 1655 01/19/20 2026 01/20/20 0347 01/20/20 0616 01/20/20 1653 01/20/20 1707 01/20/20 2025 01/21/20 0249 01/21/20 0355 01/21/20 0509 01/21/20 0754  WBC 10.8*  --  8.4  --  12.8*  --  11.8*  --   --   --  10.2  --   --   HGB 10.3*   < > 9.8*   < > 10.7*   < > 10.7*   < > 11.2* 10.9* 11.0* 10.2* 10.2*  HCT 33.2*   < > 31.7*   < > 33.7*   < > 34.9*   < > 33.0* 32.0* 34.9* 30.0* 30.0*  MCV 94.1  --  92.7  --  92.8  --  91.8  --   --   --  92.6  --   --   PLT 27*  --  24*  --  33*  --  30*  --   --   --  32*  --   --    < > = values in this interval  not displayed.    Cardiac Enzymes: No results for input(s): CKTOTAL, CKMB, CKMBINDEX, TROPONINI in the last 168 hours.  BNP: BNP (last 3 results) No results for input(s): BNP in the last 8760 hours.  ProBNP (last 3 results) No results for input(s): PROBNP in the last 8760 hours.    Other results:  Imaging: CT HEAD WO CONTRAST  Result Date: 01/20/2020 CLINICAL DATA:  Intracranial hemorrhage, follow-up EXAM: CT HEAD WITHOUT CONTRAST TECHNIQUE: Contiguous axial images were obtained from the base of the skull through the vertex without intravenous contrast. COMPARISON:  01/19/2020 FINDINGS: Brain: Multifocal parenchymal hemorrhage is again identified. As noted previously, greatest involvement in the biparietal region with the largest area of hemorrhage on the right measuring up to 3.7 cm as before. There is mild edema associated with the areas of hemorrhage without substantial mass effect. No new hemorrhage is identified. Gray-white differentiation remains preserved. Ventricles stable in size. Vascular: No new finding. Skull: Calvarium is unremarkable. Sinuses/Orbits: No acute finding. Other:  Partially imaged facial subcutaneous edema. IMPRESSION: No substantial change in multifocal parenchymal hemorrhages. Regional mass effect remains mild. No new hemorrhage. Electronically Signed   By: Macy Mis M.D.   On: 01/20/2020 11:30   CT HEAD WO CONTRAST  Result Date: 01/19/2020 CLINICAL DATA:  Mental status change, COVID positive, on ECMO EXAM: CT HEAD WITHOUT CONTRAST TECHNIQUE: Contiguous axial images were obtained from the base of the skull through the vertex without intravenous contrast. COMPARISON:  01/06/2020 FINDINGS: Brain: Multifocal areas of parenchymal hemorrhage identified bilaterally with greatest involvement of the parietal lobes. The largest area the right parietal lobe measures approximately 3.8 x 2.3 cm. There is edema associated with these hemorrhages causing mild regional mass effect. No intraventricular extension. No hydrocephalus. Gray-white differentiation is preserved. Vascular: No hyperdense vessel or unexpected calcification. Skull: Calvarium is unremarkable. Sinuses/Orbits: Nonspecific extensive paranasal sinus opacification. Other: Nonspecific mastoid and middle ear opacification. Partially imaged endotracheal and enteric tubes. IMPRESSION: Multifocal parenchymal hemorrhages, with the largest within the right parietal lobe. Together with associated edema, there is mild regional mass effect but no herniation. Considerations include sequelae of ECMO anticoagulation, hemorrhagic conversion of embolic infarcts, and vasculitis/vasculopathy. These results were called by telephone at the time of interpretation on 01/19/2020 at 2:41 pm to provider Noemi Chapel , who verbally acknowledged these results. Electronically Signed   By: Macy Mis M.D.   On: 01/19/2020 14:45   CT CHEST ABDOMEN PELVIS W CONTRAST  Result Date: 01/19/2020 CLINICAL DATA:  Sepsis. Leukocytosis. Respiratory failure. COVID ARDS. EXAM: CT CHEST, ABDOMEN, AND PELVIS WITH CONTRAST TECHNIQUE: Multidetector CT  imaging of the chest, abdomen and pelvis was performed following the standard protocol during bolus administration of intravenous contrast. CONTRAST:  120m OMNIPAQUE IOHEXOL 350 MG/ML SOLN COMPARISON:  Abdomen and pelvis CT dated 05/10/2014, portable chest obtained earlier today. FINDINGS: CT CHEST FINDINGS Cardiovascular: The right jugular ECMO catheter extends through the superior vena cava and into the inferior vena cava with its tip above the level of the renal veins. Mildly enlarged heart. Minimal pericardial fluid with a maximum thickness of 5 mm. Mediastinum/Nodes: Endotracheal tube tip 1 cm above the carina. Feeding tube extending into the stomach. Unremarkable included thyroid gland. No enlarged lymph nodes. Lungs/Pleura: Dense airspace opacity throughout both lungs with air bronchograms. Small to moderate-sized right pleural effusion and small left pleural effusion. Musculoskeletal: Thoracic spine degenerative changes. Lower cervical spine fixation hardware. CT ABDOMEN PELVIS FINDINGS Hepatobiliary: No focal liver abnormality is seen. Status post cholecystectomy. No biliary dilatation.  Pancreas: Unremarkable. No pancreatic ductal dilatation or surrounding inflammatory changes. Spleen: Normal in size without focal abnormality. Adrenals/Urinary Tract: Foley catheter in the urinary bladder with no urine in the bladder. There is associated air in the bladder. Normal appearing adrenal glands, kidneys and ureters. Stomach/Bowel: Large number of sigmoid and distal descending colon diverticula without evidence of diverticulitis. Normal appearing appendix, small bowel and stomach. Feeding tube tip in the mid to distal stomach. Rectal balloon catheter. Vascular/Lymphatic: No significant vascular findings are present. No enlarged abdominal or pelvic lymph nodes. Reproductive: Status post hysterectomy. No adnexal masses. Other: Mild bilateral subcutaneous edema. Small amount of free peritoneal fluid. Musculoskeletal:  Lumbar spine degenerative changes. IMPRESSION: 1. Dense airspace opacity throughout both lungs with air bronchograms, compatible with the clinical diagnosis of COVID ARDS. Dense bilateral pneumonia could also have this appearance. 2. Small to moderate-sized right pleural effusion and small left pleural effusion. 3. Small amount of ascites. 4. Colonic diverticulosis. Electronically Signed   By: Claudie Revering M.D.   On: 01/19/2020 15:23   DG CHEST PORT 1 VIEW  Result Date: 01/21/2020 CLINICAL DATA:  COVID-19 positivity with ECMO EXAM: PORTABLE CHEST 1 VIEW COMPARISON:  01/20/2020 FINDINGS: Endotracheal tube and left jugular central line is well as ECMO cannula are again identified. Right-sided PICC line is seen and stable. Feeding catheter has been removed in the interval. Increasing opacity is noted throughout both lungs consistent with progression of consolidation. IMPRESSION: Increasing opacity in the lungs bilaterally. Tubes and lines as described. Electronically Signed   By: Inez Catalina M.D.   On: 01/21/2020 07:17   DG CHEST PORT 1 VIEW  Result Date: 01/20/2020 CLINICAL DATA:  Respiratory failure, COVID-19 positivity and ECMO EXAM: PORTABLE CHEST 1 VIEW COMPARISON:  01/19/2020 FINDINGS: Cardiac shadow is stable. Left jugular central line, endotracheal tube and feeding catheter are noted in satisfactory position. ECMO cannulas are noted and stable. Diffuse bilateral airspace opacities are noted consistent with the given clinical history. IMPRESSION: Stable appearance of the chest when compared with the prior exam. Electronically Signed   By: Inez Catalina M.D.   On: 01/20/2020 08:27     Medications:     Scheduled Medications: . sodium chloride   Intravenous Once  . artificial tears  1 application Both Eyes O6Z  . vitamin C  500 mg Per Tube Daily  . chlorhexidine gluconate (MEDLINE KIT)  15 mL Mouth Rinse BID  . Chlorhexidine Gluconate Cloth  6 each Topical Daily  . clonazePAM  1 mg Per Tube BID   . docusate  100 mg Per Tube BID  . feeding supplement (PROSource TF)  45 mL Per Tube BID  . fentaNYL (SUBLIMAZE) injection  50 mcg Intravenous Once  . free water  300 mL Per Tube Q2H  . insulin aspart  3-9 Units Subcutaneous Q4H  . insulin detemir  38 Units Subcutaneous Q12H  . linagliptin  5 mg Per Tube Daily  . mouth rinse  15 mL Mouth Rinse 10 times per day  . methylPREDNISolone (SOLU-MEDROL) injection  40 mg Intravenous Daily  . nutrition supplement (JUVEN)  1 packet Per Tube BID BM  . oxyCODONE  5 mg Per Tube Q6H  . pantoprazole sodium  40 mg Per Tube Daily  . polyethylene glycol  17 g Per Tube Daily  . sennosides  5 mL Per Tube BID  . sodium chloride flush  10-40 mL Intracatheter Q12H  . zinc sulfate  220 mg Per Tube Daily    Infusions: . sodium  chloride 250 mL (01/20/20 0609)  . sodium chloride Stopped (01/07/2020 1009)  . albumin human 12.5 g (01/21/20 0455)  . dexmedetomidine (PRECEDEX) IV infusion 1.2 mcg/kg/hr (01/21/20 0612)  . dextrose 5 % and 0.45% NaCl Stopped (02/04/2020 0827)  . feeding supplement (PIVOT 1.5 CAL) 1,000 mL (01/20/20 0102)  . fentaNYL infusion INTRAVENOUS 400 mcg/hr (01/21/20 0600)  . furosemide (LASIX) infusion 8 mg/hr (01/21/20 0600)  . insulin Stopped (01/21/20 0121)  . midazolam Stopped (01/19/20 1120)  . norepinephrine (LEVOPHED) Adult infusion 1 mcg/min (01/21/20 7619)  . piperacillin-tazobactam (ZOSYN)  IV Stopped (01/21/20 0517)  . vancomycin 1,000 mg (01/21/20 0802)    PRN Medications: sodium chloride, acetaminophen (TYLENOL) oral liquid 160 mg/5 mL, albumin human, albuterol, dextrose, fentaNYL, fentaNYL, guaiFENesin-dextromethorphan, hydrALAZINE, hydrOXYzine, midazolam, ondansetron **OR** ondansetron (ZOFRAN) IV, sodium chloride flush   Assessment/Plan:   1. Acute hypoxic/hypercapneic respiratory failure in setting of severe COVID PNA/ARDS -> VV ECMO - admit 8/2 - intubation 8/3 - has received actmera (compelted 8/2), remdesivir  (completed 8/6) and steroids - failed full vent support with proning/paralytic - Cannulated for VV ECMO on 8/9 - TVs down and CXR worse in setting of increased sedation. Will lighten sedation as tolerated - Improved response to IV lasix with addition of metolazone. Remains markedly volume overloaded. Will continue lasix gtt. Repeat metolazone. Give albumin as needed - Off bival due to [place]. Watch circuit closely for clotting. D/w Neurology. Would not be a candidate for any AC for at days to weeks. - Elevated LDH likely due to sepsis. Now coming down  - Remains very tenuous with MSSA PNA, enterococcus sepsis, persistent volume overload and now ICH. Family aware of gravity of situation. Want to maintain all interventions for now. I updated them again today - With need to stop [facility] we discussed possibility of coming off ECMO but doubt she would survive without ECMO support at this point   2. Enterococcus sepsis - continue vanc + high dose zosyn per pharmacy   3. Intracranial hemorrhage - ? Septic emboli - repeat head CT on 8/14 with stable bleeds but increased edema - neurology following. Seems to have significant left-sided weakness - Repeat head CT in am - No AC for now - Consider TEE to evaluate for endocarditis  4. Thrombocytopenia - suspect due to sepsis.  - no obvious bleeding - PLTs 33K -32K - would not transfuse PLTs with stable bleed and increased risk of circuit clotting  4. Morbid obesity - Body mass index is 49.38 kg/m.  5. Poorly controlled DM2 - HgBA1c 10.7 - CBGs remain elevated - On IV insulin adjust as needed  4. Hypernatremia - improved. Stop free water boluses given ICH and benefit of permissive hypernatremia  5. Lactic acidosis -improved   CRITICAL CARE Performed by: Glori Bickers  Total critical care time: 50 minutes  Critical care time was exclusive of separately billable procedures and treating other patients.  Critical care was necessary to  treat or prevent imminent or life-threatening deterioration.  Critical care was time spent personally by me (independent of midlevel providers or residents) on the following activities: development of treatment plan with patient and/or surrogate as well as nursing, discussions with consultants, evaluation of patient's response to treatment, examination of patient, obtaining history from patient or surrogate, ordering and performing treatments and interventions, ordering and review of laboratory studies, ordering and review of radiographic studies, pulse oximetry and re-evaluation of patient's condition.   Length of Stay: 50   Glori Bickers  MD 01/21/2020, 8:49  AM  Advanced Heart Failure Team Pager [phone] (M-F; 7a - 4p)  Please contact [facility] for night-coverage after hours (4p -7a ) and weekends on [email or web address]

## 2020-01-21 NOTE — Progress Notes (Signed)
NAME:  Alisha Stephenson, MRN:  [id number], DOB:  07-08-1965, LOS: 39 ADMISSION DATE:  01/14/2020, CONSULTATION DATE:  8/3 REFERRING MD:  Alfredia Ferguson, CHIEF COMPLAINT:  Dyspnea   Brief History   54 y/o female admitted on 8/2 with severe acute respiratory failure with hypoxemia due to COVID 19 pneumonia.  She developed symptoms 1 week prior to admission.  Past Medical History  DM2 Diverticulitis Gallstones Ovarian cyst NAFLD Asthma  Significant Hospital Events   8/2 admit 8/3 ICU transfer, intubated 8/4 prone, paralyze 8/9 significant desaturations today 8/9 VV ECMO cannulation  Consults:  PCCM ECMO team    Procedures:  8/3 ETT >  8/3 PICC >  8/9 RIJ [place] 54F   Significant Diagnostic Tests:  7/31 CT head > NAICP 7/31 MRI/MRA brain > no acute changes, possibly small aneurysm ACOM  8/13 CT head> multiple areas of ICH  Micro Data:  8/2 blood > NG 8/2 SARS COV 2 > positive 8/4 resp > negative 8/4 urine >  8/12 blood > E. faecalis (pan-sensitive) 8/12 resp: staph aureus> MSSA  Antimicrobials:  8/2 remdesivir > 8/6 8/2 actemra  8/2 solumedrol >   8/3 ceftriaxone >  8/5 8/3 azithro >  8/5  8/12 meropenem> 8/12 vanc>  Interim history/subjective:  Followed some commands overnight. Had significant breath stacking and dyssynchrony requiring increased sedation and changes to vent triggering to prevent breath stacking.  Objective   Blood pressure 106/61, pulse (!) 52, temperature (!) 97.2 F (36.2 C), resp. rate (!) 0, height 5\' 4"  (1.626 m), weight (!) 136.9 kg, SpO2 96 %. CVP:  [8 mmHg-17 mmHg] 14 mmHg  Vent Mode: PCV FiO2 (%):  [80 %] 80 % Set Rate:  [14 bmp] 14 bmp PEEP:  [14 cmH20] 14 cmH20 Plateau Pressure:  [24 cmH20-25 cmH20] 24 cmH20   Intake/Output Summary (Last 24 hours) at 01/21/2020 1031 Last data filed at 01/21/2020 0900 Gross per 24 hour  Intake 1753.13 ml  Output 9325 ml  Net -7571.87 ml   Filed Weights   01/19/20 0500 01/20/20 0404 01/21/20  0356  Weight: (!) 138.4 kg (!) 143.7 kg (!) 136.9 kg    Examination:  General: Critically ill appearing woman leg bed intubated, lightly sedated on ECMO HENT: [place]/AT, eyes anicteric.  Improved scleral edema.  Slight disconjugate gaze. PULM: Breath sounds with rales bilaterally.  Less secretions from ET tube, still bloody.  Breathing above the vent. CV: Regular rate with irregular rhythm-PVCs, no murmurs GI: Obese, soft, nontender, nondistended Extremities: No clubbing or cyanosis.  Edema improving. Neuro: RASS -1, PERRL but left are more sluggish. Breathing above the vent. Intact cough reflex.  Following commands in all extremities, but more briskly and strongly on the right side.  Only able to move her thumb minimally on her left upper extremity.  Vent PC 14/ PS 12 + PEEP 14/ 60% with Vt ~150cc P plat unknown due to breathing over (peak 25)   Circuit: 3600RPM, 4.3L/min, 6.5L sweep flow  CXR 8/15 personally reviewed- worse opacities with almost no aerated lung.   Resolved Hospital Problem list     Assessment & Plan:  Acute hypoxic and AoC hypercapneic respiratory failure; likely underly OHS (baseline bicarb 27-30) ARDS due to COVID 19 pneumonia   VV ECMO cannulation on 8/9 for refractory hypoxemia; now with persistent hypoxemia despite ECMO, worsened in the past 24 hrs. MSSA pneumonia -Con't lung protective ventilation- PEEP 14, PS 12.  -Lasix drip for goal net negative volume status; intolerant to PRN dosing  2/2 chugging.  Adding metolazone plus albumin; scheduled albumin today to help with edema. -Remains off NMB -Continue Solu-Medrol per protocol; previously completed remdesivir per protocol.  Tocilizumab given on 8/2. -Decrease sedation as tolerated; goal RASS -1 to 0 -VAP prevention protocol -Bivalirudin for ECMO on hold with ICH for now -Continue antibiotics for pneumonia. Likely could discontinue vancomycin. -Planning for tracheostomy this week. Family aware.  ICH,  multifocal. L-sided weakness.  -Continue to hold bivalirudin -Repeat head CT tomorrow to demonstrate stability. Family understands that there will never be a safe time to resume anticoagulation, but this will have to be revisited moving forward. -Due to high LDH and visible fibrin in circuit, unable to transfuse platelets. -Holding free water to allow for hypernatremia. Head of bed greater than 30 degrees. -Likely needs TEE this week. -PT and OT consults  Shock, likely septic vs due to sedation -NE to maintain MAP >65  Lactic acidosis 2/2 sepsis; hypoxia corrected -con't to monitor per ECMO protocol -Continue sepsis management  Enterococcus bacteremia  -High-dose Zosyn; appreciate ID's assistance -repeat blood cultures today  Staph aureus pneumonia -Continue Zosyn. Likely can discontinue vancomycin.  Need for sedation for mechanical ventilation Ventilator dyssynchrony  -wean sedation-tolerating low-dose fentanyl and Precedex currently. -Continue orals oxycodone and clonazepam -Limit versed infusion as much as possible  AKI; severe azotemia--improving -Continue to monitor daily -Goal of euvolemia-- diuresis with Lasix, metolazone, albumin, although remaining net positive 2/2 chugging limiting increased rate of diuresis -Electrolyte repletion as needed  Elevated LDH and thrombocytopenia; most likely due to sepsis. Hb stable and low platelets but no schistocytes on smear suggest DIC is not cause. -Continue to monitor per ECMO protocol -Holding platelet transfusions given concern for clotting circuit  Hyperglycemia > not controlled at all with  insulin despite aggressive upward titration.  Assume poor absorption, potentially due to subcutaneous edema.   -Remains on insulin infusion -Goal BG 140-180 while admitted to the ICU -Continue linagliptin to decrease insulin requirements  Constipation, resolved  Hypernatremia  -discontinue free water repletion- will tolerate  hypernatremia for now given concern for intracranial hypertension -con't to monitor -avoid NS due to chloride content  Acute anemia due to critical illness -transfuse for Hb <8 -con't to monitor  Hyperbilirubinemia; worsening.  Likely 2/2 sepsis. No evidence of CBD obstruction; surgically absent GB. -con't to monitor daily  Encephalopathy- ICH vs septic vs due to meds vs azotemia. Very concerned for hypoxic brain injury given her severe hypoxia during this admission 2/2 her ARDS. -Avoid benzodiazepines  Best practice:  Diet: tube feeding Pain/Anxiety/Delirium protocol (if indicated): as above VAP protocol (if indicated): yes DVT prophylaxis: SCDs while bivalirudin off GI prophylaxis: famotidine Glucose control:  insulin infusion Mobility: bed rest Code Status: full Family Communication: husband [name] and daughters updated by Dr. Haroldine Laws Disposition: ICU  Labs   CBC: Recent Labs  Lab 01/19/20 [phone] 01/19/20 0844 01/19/20 1655 01/19/20 2026 01/20/20 0347 01/20/20 0616 01/20/20 1653 01/20/20 1707 01/20/20 2025 01/21/20 0249 01/21/20 0355 01/21/20 0509 01/21/20 0754  WBC 10.8*  --  8.4  --  12.8*  --  11.8*  --   --   --  10.2  --   --   HGB 10.3*   < > 9.8*   < > 10.7*   < > 10.7*   < > 11.2* 10.9* 11.0* 10.2* 10.2*  HCT 33.2*   < > 31.7*   < > 33.7*   < > 34.9*   < > 33.0* 32.0* 34.9* 30.0* 30.0*  MCV 94.1  --  92.7  --  92.8  --  91.8  --   --   --  92.6  --   --   PLT 27*  --  24*  --  33*  --  30*  --   --   --  32*  --   --    < > = values in this interval not displayed.    Basic Metabolic Panel: Recent Labs  Lab 01/16/20 0343 01/16/20 0558 01/16/20 1100 01/16/20 1159 01/18/20 1700 01/18/20 1953 01/19/20 0842 01/19/20 0844 01/19/20 1655 01/19/20 2026 01/20/20 0347 01/20/20 0616 01/20/20 1653 01/20/20 1707 01/20/20 2025 01/21/20 0249 01/21/20 0355 01/21/20 0509 01/21/20 0754  NA 155*   < > 154*   < > 149*   < > 149*   < > 146*   < > 147*   <  > 145   < > 145 147* 147* 148* 147*  K 4.3   < > 5.7*   < > 5.4*   < > 4.4   < > 4.1   < > 4.6   < > 4.8   < > 6.3* 4.3 4.1 4.3 4.2  CL 112*   < > 113*   < > 113*   < > 112*  --  109  --  110  --  106  --   --   --  104  --   --   CO2 35*   < > 34*   < > 26   < > 27  --  26  --  28  --  28  --   --   --  28  --   --   GLUCOSE 168*   < > 181*   < > 336*   < > 402*  --  241*  --  171*  --  209*  --   --   --  155*  --   --   BUN 72*   < > 84*   < > 108*   < > 105*  --  99*  --  94*  --  95*  --   --   --  87*  --   --   CREATININE 0.74   < > 1.07*   < > 1.19*   < > 1.25*  --  1.12*  --  1.21*  --  1.21*  --   --   --  1.22*  --   --   CALCIUM 8.4*   < > 8.1*   < > 8.3*   < > 8.0*  --  7.8*  --  8.1*  --  8.5*  --   --   --  8.6*  --   --   MG 3.1*  --  2.9*  --  2.7*  --   --   --   --   --   --   --   --   --   --   --   --   --   --   PHOS 2.3*  --  4.7*  --   --   --   --   --   --   --   --   --   --   --   --   --   --   --   --    < > = values in this interval not displayed.  GFR: Estimated Creatinine Clearance: 72.1 mL/min (A) (by C-G formula based on SCr of 1.22 mg/dL (H)). Recent Labs  Lab 01/18/20 1100 01/18/20 1700 01/19/20 0313 01/19/20 0842 01/19/20 1655 01/20/20 0347 01/20/20 1653 01/21/20 0355  WBC  --    < > 13.6*   < > 8.4 12.8* 11.8* 10.2  LATICACIDVEN 3.1*  --  2.1*  --   --  1.5  --  1.6   < > = values in this interval not displayed.    Liver Function Tests: Recent Labs  Lab 01/17/20 0351 01/18/20 0355 01/19/20 0313 01/20/20 0347 01/21/20 0355  AST 58* 50* 147* 97* 93*  ALT 59* 54* 108* 104* 88*  ALKPHOS 92 342* 214* 189* 167*  BILITOT 1.2 1.3* 2.0* 1.7* 2.5*  PROT 4.5* 4.7* 4.4* 4.6* 5.1*  ALBUMIN 3.0* 3.1* 2.7* 2.5* 2.7*   No results for input(s): LIPASE, AMYLASE in the last 168 hours. No results for input(s): AMMONIA in the last 168 hours.  ABG    Component Value Date/Time   PHART 7.513 (H) 01/21/2020 0754   PCO2ART 41.6 01/21/2020 0754    PO2ART 77 (L) 01/21/2020 0754   HCO3 33.7 (H) 01/21/2020 0754   TCO2 35 (H) 01/21/2020 0754   ACIDBASEDEF 2.0 01/11/2020 1203   O2SAT 97.0 01/21/2020 0754     Coagulation Profile: Recent Labs  Lab 01/17/20 0351 01/18/20 0355 01/19/20 0313 01/20/20 0347 01/21/20 0355  INR 2.1* 2.0* 2.2* 1.4* 1.5*    Cardiac Enzymes: No results for input(s): CKTOTAL, CKMB, CKMBINDEX, TROPONINI in the last 168 hours.  HbA1C: Hgb A1c MFr Bld  Date/Time Value Ref Range Status  01/30/2020 03:56 AM 10.7 (H) 4.8 - 5.6 % Final    Comment:    (NOTE) Pre diabetes:          5.7%-6.4%  Diabetes:              >6.4%  Glycemic control for   <7.0% adults with diabetes   09/04/2019 03:22 PM 9.9 (H) 4.6 - 6.5 % Final    Comment:    Glycemic Control Guidelines for People with Diabetes:Non Diabetic:  <6%Goal of Therapy: <7%Additional Action Suggested:  >8%     CBG: Recent Labs  Lab 01/20/20 1758 01/20/20 2022 01/20/20 2239 01/21/20 0010 01/21/20 0327  GLUCAP 197* 188* 152* 147* 166*     This patient is critically ill with multiple organ system failure which requires frequent high complexity decision making, assessment, support, evaluation, and titration of therapies. This was completed through the application of advanced monitoring technologies and extensive interpretation of multiple databases. During this encounter critical care time was devoted to patient care services described in this note for 80 minutes.  Julian Hy, DO 01/21/20 11:10 AM [facility]

## 2020-01-21 NOTE — Progress Notes (Signed)
All ECMO lines chattering, pt's BP elevated, POX 83% with good waveform.  Dr. [name] called directly and notified. 2 small bottles albumin already given per previous order.   1 large bottle albumin given per order.    1330 - No change in chatter.  Chattering and hemodynamics unchanged with admin of albumin.  Sedation (precedex and fentanyl) doses increased per Dr. [name].    No change in chatter/hemodynamics.  Dr [name] aware.  Dr. [name] also made aware of urine output thus far.  Order received to hold metolazone 5mg  x1 and to hold lasix drip.    1420 - no chatter noted and o2 sat 95%.  Lasix drip remains off.  BP still remains elevated via art line at 171/63-85.    Will continue to closely monitor.

## 2020-01-21 NOTE — Progress Notes (Signed)
Assisted family with tele-visit via elink 

## 2020-01-21 NOTE — Progress Notes (Signed)
Assisted tele visit to patient with family member.  Tonita Bills M, RN  

## 2020-01-21 NOTE — CV Procedure (Signed)
ECMO NOTE:  Indication: Respiratory failure due to COVID PNA  Initial cannulation date: 01/18/2020  ECMO type: VV ECMO (Centrimag with oximizer)  Dual lumen Inflow/return cannula:  1) 32 FR RIJ [place] placed 01/24/2020  ECMO events:  - Initial cannulation 02/06/2020 - Cannula repositioned (pulled back ) 01/17/20 - Off bival 8/13 due to [place]  Daily data:  Flow 4.3L RPM 3600 Sweep6.5  Labs:  7.51/42/77/34/ 97% Hgb11.0 Platelets 32 LDH 608 (pre-cannulation) -> 558 -> 543 -> 501  -> 623 -> 1,110-->1103-->734-->585 PTT (off bival 2/2 bleeding) = 52 Lactic acid2.7 -> 2.5 -> 2.8-> 2.1 -> 3.3 -> 2.1->1.5--> 1.6  Plan: Continue ECMO support. Remains off paralytic. Tolerating decreasing sedation. Bcx + enterococcus -> zosyn Responding better to diuresis with metolazone and albumin. PLTs 32K with bleeding> ICH. Needs to be off all AC for 1-2+ weeks per Neurology. L>R weakness- PT & OT consults. Suspect low PLTs and rise in LDH due to sepsis, but circuit with fibrin.    Discussed in multidisciplinary fashion on ECMO rounds with Cardiology, ECMO coordinator/specialist, RT, PharmD, and nursing staff all present.    Julian Hy, DO 01/21/20 10:23 AM [facility]

## 2020-01-21 NOTE — Progress Notes (Signed)
Daily Progress Note   Patient Name: Alisha Stephenson       Date: 01/21/2020 DOB: 02/01/1966  Age: 54 y.o. MRN#: [id number] Attending Physician: Julian Hy, DO Primary Care Physician: Shelda Pal, DO Admit Date: 02/02/2020  Reason for Consultation/Follow-up: continued [place] discussions in the setting of complex medical decision making  Subjective: Patient is now following commands, although is not moving her left arm.  Discussed in [place] team rounds that she remains high risk for thrombosis due to covid+ and ECMO circuit, but will remain off anticoagulation for now.  Family updated at length over the phone by Dr. Haroldine Laws with all team members present (PCCM, advanced heart failure team, neurology, nursing, palliative). Discussed that patient has shown slight improvement overnight and medical team is dedicated to continued progress. Discussed risks and benefits of re-starting anticoagulation.  I later spoke with family to follow-up on their concerns regarding visitation policy. Discussed again that for covid+ patients, visitation is only allowed at EOL. Discussed that the 21 day time limit does not apply to critically ill patients, and this is per infection prevention. Emphasized that patient is thankfully not considered EOL at this time, especially with slight improvement overnight and continued full scope care. Discussed that the policy is in place to protect all patients, staff, and visitors. Family verbalizes understanding. I have provided family with the phone number for patient experience if they wish to voice continued concerns regarding the visitation policy.    Length of Stay: 13   Continuous Infusions:  sodium chloride 250 mL (01/20/20 0609)   sodium chloride Stopped (01/22/2020  1009)   albumin human 12.5 g (01/21/20 0455)   dexmedetomidine (PRECEDEX) IV infusion 1.2 mcg/kg/hr (01/21/20 0612)   dextrose 5 % and 0.45% NaCl Stopped (01/14/2020 0827)   feeding supplement (PIVOT 1.5 CAL) 1,000 mL (01/20/20 0102)   fentaNYL infusion INTRAVENOUS 400 mcg/hr (01/21/20 0600)   furosemide (LASIX) infusion 8 mg/hr (01/21/20 0600)   insulin Stopped (01/21/20 0121)   midazolam Stopped (01/19/20 1120)   norepinephrine (LEVOPHED) Adult infusion 1 mcg/min (01/21/20 9735)   piperacillin-tazobactam (ZOSYN)  IV Stopped (01/21/20 0517)   vancomycin 1,000 mg (01/21/20 0802)          Vital Signs: BP 106/61    Pulse (!) 52    Temp (!) 97.2 F (36.2  C)    Resp (!) 0    Ht 5\' 4"  (1.626 m)    Wt (!) 136.9 kg    SpO2 96%    BMI 51.81 kg/m  SpO2: SpO2: 96 % O2 Device: O2 Device: Ventilator  Intake/output summary:   Intake/Output Summary (Last 24 hours) at 01/21/2020 1005 Last data filed at 01/21/2020 0900 Gross per 24 hour  Intake 1753.13 ml  Output 9325 ml  Net -7571.87 ml   LBM: Last BM Date: 01/20/20 Baseline Weight: Weight: (!) 122.5 kg Most recent weight: Weight: (!) 136.9 kg       Palliative Assessment/Data: 10%      Patient Active Problem List   Diagnosis Date Noted   Personal history of extracorporeal membrane oxygenation (ECMO)    Palliative care by specialist    Goals of care, counseling/discussion    Intracerebral hemorrhage 01/19/2020   Pneumonia due to COVID-19 virus 01/31/2020   Acute respiratory distress syndrome (ARDS) due to COVID-19 virus ([place]) 01/14/2020   Diabetes mellitus type 1.5, managed as type 1 ([place]) 10/12/2018   NAFLD (nonalcoholic fatty liver disease)    Seborrheic keratoses 03/23/2018   Allergic rhinitis 11/14/2014   Asthma in adult 11/14/2014   IBS (irritable bowel syndrome) 11/14/2014    Palliative Care Assessment & Plan   HPI/Patient Profile: 54 y.o. female  with past medical history of DM type 2, asthma,  diverticulitis, and NAFLD.  She tested positive for Covid-19 on 12/31/19 and at that time was having symptoms of diarrhea, cough, and congestion. She presented to the [facility] ED on 01/06/20 with headache, MRI was negative and she was sent home. She presented again to the [facility] ED on 01/24/2020 with dyspnea. In the ED, she required high flow oxygen to maintain O2 saturation, chest x-ray showed infiltrates concerning for pneumonia. She was started on actemra, steroids, and remdesivir; admitted to [facility]. Her respiratory status rapidly declined requiring intubation. She developed severe ARDS secondary to Covid pneumonia, requiring prone positioning, paralytics, and VV ECMO. Hospital course complicated by multi-system organ failure (thrombocytopenia, AKI, shock liver) as well as enterococcal bacteremia.   Significant events: 8/3--worsening hypoxemia and respiratory distress, transferred to ICU, intubated.  8/4--proned and requiring paralytics 8/5--completed remdesivir 8/9--unable to oxygenate despite maximal vent settings, transferred to [facility], underwent successful VV ECMO cannulation in the cath lab, started on Angiomax for anticoagulation 8/13--acute mental status change, CT head shows multifocal areas of parenchymal hemorrhage bilaterally  Assessment: - severe ARDS secondary to COVID 19 pneumonia - acute hypoxic and hypercapneic respiratory failure  - VV ECMO cannulation 8/9 for refractory hypoxemia - intracranial hemorrhage secondary to anticoagulation  Recommendations/Plan: - no CPR/ACLS in the event of cardiac arrest  - family remains hopeful for improvement - in the event of rapid decline/probable EOL, please allow for 3 visitors at bedside (per visitation guidelines) - Palliative medicine will continue to shadow the chart, please call if there are urgent needs to re-engage with family  Prognosis: Unable to determine, She remains at high risk for rapid  decline  Discharge Planning: To Be Determined   Thank you for allowing the Palliative Medicine Team to assist in the care of this patient.   Total Time 35 minutes Prolonged Time Billed  no      Greater than 50%  of this time was spent counseling and coordinating care related to the above assessment and plan.  Lavena Bullion, NP  Please contact Palliative Medicine Team phone at [phone] for  questions and concerns.

## 2020-01-22 ENCOUNTER — Inpatient Hospital Stay (HOSPITAL_COMMUNITY): Payer: PRIVATE HEALTH INSURANCE

## 2020-01-22 DIAGNOSIS — U071 COVID-19: Secondary | ICD-10-CM | POA: Diagnosis not present

## 2020-01-22 DIAGNOSIS — J80 Acute respiratory distress syndrome: Secondary | ICD-10-CM | POA: Diagnosis not present

## 2020-01-22 LAB — POCT I-STAT 7, (LYTES, BLD GAS, ICA,H+H)
Acid-Base Excess: 12 mmol/L — ABNORMAL HIGH (ref 0.0–2.0)
Acid-Base Excess: 9 mmol/L — ABNORMAL HIGH (ref 0.0–2.0)
Acid-Base Excess: 9 mmol/L — ABNORMAL HIGH (ref 0.0–2.0)
Acid-Base Excess: 11 mmol/L — ABNORMAL HIGH (ref 0.0–2.0)
Acid-Base Excess: 10 mmol/L — ABNORMAL HIGH (ref 0.0–2.0)
Acid-Base Excess: 10 mmol/L — ABNORMAL HIGH (ref 0.0–2.0)
Bicarbonate: 38.6 mmol/L — ABNORMAL HIGH (ref 20.0–28.0)
Bicarbonate: 37.5 mmol/L — ABNORMAL HIGH (ref 20.0–28.0)
Bicarbonate: 35.2 mmol/L — ABNORMAL HIGH (ref 20.0–28.0)
Bicarbonate: 37.4 mmol/L — ABNORMAL HIGH (ref 20.0–28.0)
Bicarbonate: 34.4 mmol/L — ABNORMAL HIGH (ref 20.0–28.0)
Bicarbonate: 34.6 mmol/L — ABNORMAL HIGH (ref 20.0–28.0)
Calcium, Ion: 1.22 mmol/L (ref 1.15–1.40)
Calcium, Ion: 1.19 mmol/L (ref 1.15–1.40)
Calcium, Ion: 1.18 mmol/L (ref 1.15–1.40)
Calcium, Ion: 1.14 mmol/L — ABNORMAL LOW (ref 1.15–1.40)
Calcium, Ion: 1.16 mmol/L (ref 1.15–1.40)
Calcium, Ion: 1.14 mmol/L — ABNORMAL LOW (ref 1.15–1.40)
HCT: 25 % — ABNORMAL LOW (ref 36.0–46.0)
HCT: 26 % — ABNORMAL LOW (ref 36.0–46.0)
HCT: 25 % — ABNORMAL LOW (ref 36.0–46.0)
HCT: 28 % — ABNORMAL LOW (ref 36.0–46.0)
HCT: 25 % — ABNORMAL LOW (ref 36.0–46.0)
HCT: 25 % — ABNORMAL LOW (ref 36.0–46.0)
Hemoglobin: 8.5 g/dL — ABNORMAL LOW (ref 12.0–15.0)
Hemoglobin: 8.8 g/dL — ABNORMAL LOW (ref 12.0–15.0)
Hemoglobin: 8.5 g/dL — ABNORMAL LOW (ref 12.0–15.0)
Hemoglobin: 9.5 g/dL — ABNORMAL LOW (ref 12.0–15.0)
Hemoglobin: 8.5 g/dL — ABNORMAL LOW (ref 12.0–15.0)
Hemoglobin: 8.5 g/dL — ABNORMAL LOW (ref 12.0–15.0)
O2 Saturation: 92 %
O2 Saturation: 88 %
O2 Saturation: 90 %
O2 Saturation: 89 %
O2 Saturation: 94 %
O2 Saturation: 87 %
Patient temperature: 35.2
Patient temperature: 35.5
Patient temperature: 36.3
Patient temperature: 36.3
Patient temperature: 36.2
Potassium: 3.9 mmol/L (ref 3.5–5.1)
Potassium: 3.9 mmol/L (ref 3.5–5.1)
Potassium: 3.7 mmol/L (ref 3.5–5.1)
Potassium: 4.7 mmol/L (ref 3.5–5.1)
Potassium: 4.2 mmol/L (ref 3.5–5.1)
Potassium: 4.9 mmol/L (ref 3.5–5.1)
Sodium: 149 mmol/L — ABNORMAL HIGH (ref 135–145)
Sodium: 150 mmol/L — ABNORMAL HIGH (ref 135–145)
Sodium: 151 mmol/L — ABNORMAL HIGH (ref 135–145)
Sodium: 151 mmol/L — ABNORMAL HIGH (ref 135–145)
Sodium: 151 mmol/L — ABNORMAL HIGH (ref 135–145)
Sodium: 149 mmol/L — ABNORMAL HIGH (ref 135–145)
TCO2: 40 mmol/L — ABNORMAL HIGH (ref 22–32)
TCO2: 40 mmol/L — ABNORMAL HIGH (ref 22–32)
TCO2: 37 mmol/L — ABNORMAL HIGH (ref 22–32)
TCO2: 39 mmol/L — ABNORMAL HIGH (ref 22–32)
TCO2: 36 mmol/L — ABNORMAL HIGH (ref 22–32)
TCO2: 36 mmol/L — ABNORMAL HIGH (ref 22–32)
pCO2 arterial: 63.5 mmHg — ABNORMAL HIGH (ref 32.0–48.0)
pCO2 arterial: 69.8 mmHg (ref 32.0–48.0)
pCO2 arterial: 53.2 mmHg — ABNORMAL HIGH (ref 32.0–48.0)
pCO2 arterial: 57.9 mmHg — ABNORMAL HIGH (ref 32.0–48.0)
pCO2 arterial: 43.9 mmHg (ref 32.0–48.0)
pCO2 arterial: 44.8 mmHg (ref 32.0–48.0)
pH, Arterial: 7.391 (ref 7.350–7.450)
pH, Arterial: 7.329 — ABNORMAL LOW (ref 7.350–7.450)
pH, Arterial: 7.422 (ref 7.350–7.450)
pH, Arterial: 7.415 (ref 7.350–7.450)
pH, Arterial: 7.5 — ABNORMAL HIGH (ref 7.350–7.450)
pH, Arterial: 7.492 — ABNORMAL HIGH (ref 7.350–7.450)
pO2, Arterial: 68 mmHg — ABNORMAL LOW (ref 83.0–108.0)
pO2, Arterial: 56 mmHg — ABNORMAL LOW (ref 83.0–108.0)
pO2, Arterial: 54 mmHg — ABNORMAL LOW (ref 83.0–108.0)
pO2, Arterial: 55 mmHg — ABNORMAL LOW (ref 83.0–108.0)
pO2, Arterial: 62 mmHg — ABNORMAL LOW (ref 83.0–108.0)
pO2, Arterial: 48 mmHg — ABNORMAL LOW (ref 83.0–108.0)

## 2020-01-22 LAB — BASIC METABOLIC PANEL
Anion gap: 14 (ref 5–15)
Anion gap: 12 (ref 5–15)
BUN: 83 mg/dL — ABNORMAL HIGH (ref 6–20)
BUN: 86 mg/dL — ABNORMAL HIGH (ref 6–20)
CO2: 33 mmol/L — ABNORMAL HIGH (ref 22–32)
CO2: 32 mmol/L (ref 22–32)
Calcium: 9 mg/dL (ref 8.9–10.3)
Calcium: 9.1 mg/dL (ref 8.9–10.3)
Chloride: 104 mmol/L (ref 98–111)
Chloride: 107 mmol/L (ref 98–111)
Creatinine, Ser: 1.08 mg/dL — ABNORMAL HIGH (ref 0.44–1.00)
Creatinine, Ser: 1.04 mg/dL — ABNORMAL HIGH (ref 0.44–1.00)
GFR calc Af Amer: >60 mL/min (ref >60)
GFR calc Af Amer: >60 mL/min (ref >60)
GFR calc non Af Amer: 58 mL/min — ABNORMAL LOW (ref >60)
GFR calc non Af Amer: >60 mL/min (ref >60)
Glucose, Bld: 169 mg/dL — ABNORMAL HIGH (ref 70–99)
Glucose, Bld: 181 mg/dL — ABNORMAL HIGH (ref 70–99)
Potassium: 4 mmol/L (ref 3.5–5.1)
Potassium: 4.6 mmol/L (ref 3.5–5.1)
Sodium: 151 mmol/L — ABNORMAL HIGH (ref 135–145)
Sodium: 151 mmol/L — ABNORMAL HIGH (ref 135–145)

## 2020-01-22 LAB — HEPATIC FUNCTION PANEL
ALT: 62 U/L — ABNORMAL HIGH (ref 0–44)
AST: 79 U/L — ABNORMAL HIGH (ref 15–41)
Albumin: 4.5 g/dL (ref 3.5–5.0)
Alkaline Phosphatase: 132 U/L — ABNORMAL HIGH (ref 38–126)
Bilirubin, Direct: 0.8 mg/dL — ABNORMAL HIGH (ref 0.0–0.2)
Indirect Bilirubin: 1.4 mg/dL — ABNORMAL HIGH (ref 0.3–0.9)
Total Bilirubin: 2.2 mg/dL — ABNORMAL HIGH (ref 0.3–1.2)
Total Protein: 6 g/dL — ABNORMAL LOW (ref 6.5–8.1)

## 2020-01-22 LAB — CBC
HCT: 29.5 % — ABNORMAL LOW (ref 36.0–46.0)
HCT: 31.1 % — ABNORMAL LOW (ref 36.0–46.0)
Hemoglobin: 9.1 g/dL — ABNORMAL LOW (ref 12.0–15.0)
Hemoglobin: 9.2 g/dL — ABNORMAL LOW (ref 12.0–15.0)
MCH: 29.8 pg (ref 26.0–34.0)
MCH: 28.8 pg (ref 26.0–34.0)
MCHC: 30.8 g/dL (ref 30.0–36.0)
MCHC: 29.6 g/dL — ABNORMAL LOW (ref 30.0–36.0)
MCV: 96.7 fL (ref 80.0–100.0)
MCV: 97.2 fL (ref 80.0–100.0)
Platelets: 31 10*3/uL — ABNORMAL LOW (ref 150–400)
Platelets: 37 10*3/uL — ABNORMAL LOW (ref 150–400)
RBC: 3.05 MIL/uL — ABNORMAL LOW (ref 3.87–5.11)
RBC: 3.2 MIL/uL — ABNORMAL LOW (ref 3.87–5.11)
RDW: 17.1 % — ABNORMAL HIGH (ref 11.5–15.5)
RDW: 17.1 % — ABNORMAL HIGH (ref 11.5–15.5)
WBC: 8 10*3/uL (ref 4.0–10.5)
WBC: 9.1 10*3/uL (ref 4.0–10.5)
nRBC: 1 % — ABNORMAL HIGH (ref 0.0–0.2)
nRBC: 1 % — ABNORMAL HIGH (ref 0.0–0.2)

## 2020-01-22 LAB — LACTIC ACID, PLASMA: Lactic Acid, Venous: 1.5 mmol/L (ref 0.5–1.9)

## 2020-01-22 LAB — DIC (DISSEMINATED INTRAVASCULAR COAGULATION)PANEL
D-Dimer, Quant: >20 ug/mL-FEU — ABNORMAL HIGH (ref 0.00–0.50)
Fibrinogen: <60 mg/dL — CL (ref 210–475)
INR: 2 — ABNORMAL HIGH (ref 0.8–1.2)
Platelets: 29 10*3/uL — CL (ref 150–400)
Prothrombin Time: 21.6 seconds — ABNORMAL HIGH (ref 11.4–15.2)
Smear Review: NONE SEEN
aPTT: 45 seconds — ABNORMAL HIGH (ref 24–36)

## 2020-01-22 LAB — GLUCOSE, CAPILLARY
Glucose-Capillary: 221 mg/dL — ABNORMAL HIGH (ref 70–99)
Glucose-Capillary: 180 mg/dL — ABNORMAL HIGH (ref 70–99)
Glucose-Capillary: 169 mg/dL — ABNORMAL HIGH (ref 70–99)
Glucose-Capillary: 233 mg/dL — ABNORMAL HIGH (ref 70–99)
Glucose-Capillary: 245 mg/dL — ABNORMAL HIGH (ref 70–99)
Glucose-Capillary: 178 mg/dL — ABNORMAL HIGH (ref 70–99)
Glucose-Capillary: 191 mg/dL — ABNORMAL HIGH (ref 70–99)
Glucose-Capillary: 118 mg/dL — ABNORMAL HIGH (ref 70–99)
Glucose-Capillary: 129 mg/dL — ABNORMAL HIGH (ref 70–99)
Glucose-Capillary: 290 mg/dL — ABNORMAL HIGH (ref 70–99)

## 2020-01-22 LAB — PROTIME-INR
INR: 1.9 — ABNORMAL HIGH (ref 0.8–1.2)
Prothrombin Time: 20.9 seconds — ABNORMAL HIGH (ref 11.4–15.2)

## 2020-01-22 LAB — HEPARIN LEVEL (UNFRACTIONATED): Heparin Unfractionated: <0.1 IU/mL — ABNORMAL LOW (ref 0.30–0.70)

## 2020-01-22 LAB — FIBRINOGEN: Fibrinogen: <60 mg/dL — CL (ref 210–475)

## 2020-01-22 LAB — LACTATE DEHYDROGENASE: LDH: 639 U/L — ABNORMAL HIGH (ref 98–192)

## 2020-01-22 MED ORDER — HEPARIN (PORCINE) 25000 UT/250ML-% IV SOLN
500.0000 [IU]/h | INTRAVENOUS | Status: DC
  Filled 2020-01-22: qty 250

## 2020-01-22 MED ORDER — ETOMIDATE 2 MG/ML IV SOLN: 40.0000 mg | Freq: Once | INTRAVENOUS | Status: DC

## 2020-01-22 MED ORDER — FENTANYL CITRATE (PF) 100 MCG/2ML IJ SOLN
200.0000 ug | Freq: Once | INTRAMUSCULAR | Status: DC
  Filled 2020-01-22: qty 4

## 2020-01-22 MED ORDER — PROPOFOL 500 MG/50ML IV EMUL
INTRAVENOUS | Status: AC
  Filled 2020-01-22: qty 50

## 2020-01-22 MED ORDER — MIDAZOLAM HCL 2 MG/2ML IJ SOLN
5.0000 mg | Freq: Once | INTRAMUSCULAR | Status: DC
  Filled 2020-01-22: qty 6

## 2020-01-22 MED ORDER — ETOMIDATE 2 MG/ML IV SOLN
INTRAVENOUS | Status: AC
  Filled 2020-01-22: qty 20

## 2020-01-22 MED ORDER — QUETIAPINE FUMARATE 50 MG PO TABS
50.0000 mg | ORAL_TABLET | Freq: Every day | ORAL | Status: DC
  Administered 2020-01-22: 50 mg
  Filled 2020-01-22: qty 1

## 2020-01-22 MED ORDER — VECURONIUM BROMIDE 10 MG IV SOLR: 10.0000 mg | Freq: Once | INTRAVENOUS | Status: DC

## 2020-01-22 MED ORDER — CLONAZEPAM 1 MG PO TABS
1.0000 mg | ORAL_TABLET | Freq: Once | ORAL | Status: AC
  Administered 2020-01-22: 1 mg via ORAL
  Filled 2020-01-22: qty 1

## 2020-01-22 MED ORDER — METHYLPREDNISOLONE SODIUM SUCC 40 MG IJ SOLR
20.0000 mg | Freq: Every day | INTRAMUSCULAR | Status: AC
  Administered 2020-01-23 – 2020-01-26 (×4): 20 mg via INTRAVENOUS
  Filled 2020-01-22 (×5): qty 1

## 2020-01-22 MED ORDER — LABETALOL HCL 5 MG/ML IV SOLN
INTRAVENOUS | Status: AC
  Filled 2020-01-22: qty 4

## 2020-01-22 MED ORDER — SODIUM CHLORIDE 0.9% IV SOLUTION: Freq: Once | INTRAVENOUS | Status: AC

## 2020-01-22 MED ORDER — CLONAZEPAM 0.5 MG PO TBDP
1.0000 mg | ORAL_TABLET | Freq: Three times a day (TID) | ORAL | Status: DC
  Administered 2020-01-22 – 2020-01-24 (×5): 1 mg
  Filled 2020-01-22 (×5): qty 2

## 2020-01-22 MED ORDER — VECURONIUM BROMIDE 10 MG IV SOLR
INTRAVENOUS | Status: AC
  Filled 2020-01-22: qty 10

## 2020-01-22 MED ORDER — FREE WATER
300.0000 mL | Status: DC
  Administered 2020-01-22: 300 mL

## 2020-01-22 MED ORDER — STERILE WATER FOR INJECTION IJ SOLN
INTRAMUSCULAR | Status: AC
  Filled 2020-01-22: qty 10

## 2020-01-22 MED ORDER — HALOPERIDOL LACTATE 5 MG/ML IJ SOLN
5.0000 mg | Freq: Once | INTRAMUSCULAR | Status: AC
  Administered 2020-01-22: 5 mg via INTRAVENOUS
  Filled 2020-01-22: qty 1

## 2020-01-22 MED ORDER — LABETALOL HCL 5 MG/ML IV SOLN
0.5000 mg/min | Status: DC
  Administered 2020-01-22 – 2020-01-23 (×2): 0.5 mg/min via INTRAVENOUS
  Filled 2020-01-22 (×4): qty 80

## 2020-01-22 NOTE — Plan of Care (Signed)
Pt is alert any time sedation is slightly decreased and isable to follow simple commands, nods appropriately to yes/no questions, and is able to mouth out words. When alert, pt is very tachypnic and develops labored breathing. Pt nods when asked orientation questions. Pt denies pain, nausea, and shortness of breath. When asked if nervous/anxious, pt moves her head up and down and moves her head from side to side when nurse attempts to leave the room. Pt is very weak but able to squeeze hands bilaterally and wiggles her toes. Sedation has been titrated up to the max dose per protocol for pt comfort. Pt has been reassured constantly throughout the night and guided imagery was used to comfort her. Pt was slightly wedged to prevent agitation.  Oxygen saturation drops to the low 80s when anxious but when resting, it increases up to 94%. Blood pressure is also increased with MAP over 100 when alert and as low as 50s when resting.   Pt did receive 1 PRN dose of albumin for chugging earlier in the shift, see MAR. Pt has periods of tachycardia in the 130s and converts to NSR in the 70s, occasional PVCs noted.    Problem: Education: Goal: Knowledge of General Education information will improve Description: Including pain rating scale, medication(s)/side effects and non-pharmacologic comfort measures Outcome: Progressing   Problem: Clinical Measurements: Goal: Cardiovascular complication will be avoided Outcome: Progressing   Problem: Activity: Goal: Risk for activity intolerance will decrease Outcome: Progressing   Problem: Elimination: Goal: Will not experience complications related to bowel motility Outcome: Progressing Goal: Will not experience complications related to urinary retention Outcome: Progressing   Problem: Pain Managment: Goal: General experience of comfort will improve Outcome: Progressing

## 2020-01-22 NOTE — Progress Notes (Signed)
NAME:  Alisha Stephenson, MRN:  [id number], DOB:  September 01, 1965, LOS: 76 ADMISSION DATE:  01/31/2020, CONSULTATION DATE:  8/3 REFERRING MD:  Alfredia Ferguson, CHIEF COMPLAINT:  Dyspnea   Brief History   54 y/o female admitted on 8/2 with severe acute respiratory failure with hypoxemia due to COVID 19 pneumonia.  She developed symptoms 1 week prior to admission.  Past Medical History  DM2 Diverticulitis Gallstones Ovarian cyst NAFLD Asthma  Significant Hospital Events   8/2 admit 8/3 ICU transfer, intubated 8/4 prone, paralyze 8/9 significant desaturations today 8/9 VV ECMO cannulation 8/13 head bleed  Consults:  PCCM ECMO team    Procedures:  8/3 ETT >  8/3 PICC >  8/9 LIJ MML 8/9 RIJ [place] 69F   Significant Diagnostic Tests:  7/31 CT head > NAICP 7/31 MRI/MRA brain > no acute changes, possibly small aneurysm ACOM 8/13 CT head> multiple areas of ICH  Micro Data:  8/2 blood > NG 8/2 SARS COV 2 > positive 8/4 resp > negative 8/4 urine >  8/12 blood > E. faecalis (pan-sensitive) 8/12 resp: staph aureus> MSSA  Antimicrobials:  8/2 remdesivir > 8/6 8/2 actemra  8/2 solumedrol >   8/3 ceftriaxone >  8/5 8/3 azithro >  8/5  8/12 meropenem> 8/12 vanc>  Interim history/subjective:  Following commands.  Denies pain. Periods of severe anxiety persist.  Objective   Blood pressure (!) 135/52, pulse 87, temperature (!) 95.7 F (35.4 C), resp. rate (!) 21, height 5\' 4"  (1.626 m), weight 135.9 kg, SpO2 (!) 83 %. CVP:  [5 mmHg-26 mmHg] 25 mmHg  Vent Mode: PCV FiO2 (%):  [60 %-100 %] 100 % Set Rate:  [14 bmp] 14 bmp PEEP:  [14 cmH20] 14 cmH20 Plateau Pressure:  [34 cmH20-39 cmH20] 39 cmH20   Intake/Output Summary (Last 24 hours) at 01/22/2020 0755 Last data filed at 01/22/2020 0700 Gross per 24 hour  Intake 3028.31 ml  Output 5475 ml  Net -2446.69 ml   Filed Weights   01/20/20 0404 01/21/20 0356 01/22/20 0335  Weight: (!) 143.7 kg (!) 136.9 kg 135.9 kg     Examination:  General: Critically ill appearing woman leg bed intubated, lightly sedated on ECMO HENT: [place]/AT, eyes anicteric.  Improved scleral edema.  Slight disconjugate gaze. PULM: Breath sounds with rales bilaterally.  Less secretions from ET tube, still bloody.  Breathing above the vent. CV: Regular rate with irregular rhythm-PVCs, no murmurs GI: Obese, soft, nontender, nondistended Extremities: No clubbing or cyanosis.  Edema improving. Neuro: RASS -1, PERRL but left are more sluggish. Breathing above the vent. Intact cough reflex.  Following commands in all extremities, but more briskly and strongly on the right side.  Only able to move her thumb minimally on her left upper extremity.  Vent PC 14/ PS 12 + PEEP 14/ 60% with Vt ~250cc P plat unknown due to breathing over (peak 25)   Circuit: 3600RPM, 4.3L/min, 6.5L sweep flow  CXR 8/16 personally reviewed- severe bilateral airspace disease   Resolved Hospital Problem list     Assessment & Plan:  Acute hypoxic and AoC hypercapneic respiratory failure; likely underly OHS (baseline bicarb 27-30) ARDS due to COVID 19 pneumonia   VV ECMO cannulation on 8/9 for refractory hypoxemia MSSA pneumonia Enterococcal bacteremia -Con't lung protective ventilation- PEEP 14, PS 12.  -Lasix drip for goal net negative volume status; intolerant to PRN dosing 2/2 chugging.  Adding metolazone plus albumin; scheduled albumin today to help with edema. -Remains off NMB -Continue Solu-Medrol per  protocol; previously completed remdesivir per protocol.  Tocilizumab given on 8/2. -Decrease sedation as tolerated; goal RASS -1 to 0 -VAP prevention protocol -Bivalirudin for ECMO on hold with ICH for now -High dose zosyn for now, duration TBD -Planning for tracheostomy later today. Family aware.  ICH, multifocal. L-sided weakness.  -Continue to hold bivalirudin -Discussion with neurology on when we can retrial AC; do not think we need a repeat CT  today UNLESS it would lead to [place] being able to use AC.  Better option would be to see if we can expedite ECMO wean -Likely needs TEE this week. -PT and OT consults  Shock, likely septic vs due to sedation -NE to maintain MAP >65  Need for sedation for mechanical ventilation Ventilator dyssynchrony  -wean sedation-tolerating low-dose fentanyl and Precedex currently. -Continue orals oxycodone and clonazepam -Limit versed infusion as much as possible  AKI; severe azotemia -Continue to monitor daily -Diuretic discussion daily with CHF team -Electrolyte repletion as needed  Elevated LDH, hypo-fibrinogenemia and thrombocytopenia; most likely due to sepsis, ?liver dx likely. Hb stable and low platelets but no schistocytes on smear suggest DIC is not cause. -Continue to monitor per ECMO protocol -Holding platelet transfusions given concern for clotting circuit - Check DIC panel and discuss what, if anything should be given pre-trach  Hyperglycemia > not controlled at all with Edgar insulin despite aggressive upward titration.  Assume poor absorption, potentially due to subcutaneous edema.    -Remains on insulin infusion -Goal BG 140-180 while admitted to the ICU -Continue linagliptin to decrease insulin requirements  Constipation, resolved  Hypernatremia  -discontinue free water repletion- will tolerate hypernatremia for now given concern for intracranial hypertension -con't to monitor -avoid NS due to chloride content  Acute anemia due to critical illness -transfuse for Hb <8 -con't to monitor   Encephalopathy- ICH vs septic vs due to meds vs azotemia. Very concerned for hypoxic brain injury given her severe hypoxia during this admission 2/2 her ARDS.  Improving -Avoid benzodiazepines  Best practice:  Diet: tube feeding Pain/Anxiety/Delirium protocol (if indicated): as above VAP protocol (if indicated): yes DVT prophylaxis: SCDs while bivalirudin off GI prophylaxis:  famotidine Glucose control:  insulin infusion Mobility: bed rest Code Status: full Family Communication: per Dr. Haroldine Laws Disposition: ICU    The patient is critically ill with multiple organ systems failure and requires high complexity decision making for assessment and support, frequent evaluation and titration of therapies, application of advanced monitoring technologies and extensive interpretation of multiple databases. Critical Care Time devoted to patient care services described in this note independent of APP/resident time (if applicable)  is 43 minutes.   Erskine Emery MD [facility] 01/22/2020 8:46 AM Personal pager: [phone] If unanswered, please page CCM On-call: [phone]

## 2020-01-22 NOTE — Progress Notes (Signed)
Advanced Heart Failure Rounding Note   Subjective:    - 8/9 Cannulated for VV ECMO - 8/13 with several areas of intracranial hemorrhage. Bival stopped.  - 8/14 CT no change in [place]. Increased edema  Remains on vent. On VV ECMO. More agitated this am so sedation increased.   Was chugging. Speed turned down. Lasix gtt stopped.  Sats high 80s to low 90s  On high-dose zosyn and vanc for enterococcus in blood and MSSA in sputum   CXR stable diffuse infiltrates No change  PLTs remain 31K. Fibrinogen dropping  ECMO   Flow 3.5L RPM 3000 Sweep6.5 -> 4  Labs:  7.39/64/68/92% on FiO2 100% TV 220 Hgb9.1 PLT 31 LDH 608 (pre-cannulation) -> 558 -> 543 -> 501  -> 623 -> 1,110 -> 734 -> 585 -> 639 PTT => off bival Lactic acid1.6-> 1.5  Objective:   Weight Range:  Vital Signs:   Temp:  [95.7 F (35.4 C)-97.3 F (36.3 C)] 95.7 F (35.4 C) (08/16 0800) Pulse Rate:  [50-123] 123 (08/16 0800) Resp:  [0-53] 19 (08/16 0800) BP: (101-195)/(41-75) 139/57 (08/16 0800) SpO2:  [82 %-98 %] 82 % (08/16 0800) Arterial Line BP: (80-229)/(44-72) 162/66 (08/16 0800) FiO2 (%):  [60 %-100 %] 100 % (08/16 0400) Weight:  [135.9 kg] 135.9 kg (08/16 0335) Last BM Date: 01/21/20  Weight change: Filed Weights   01/20/20 0404 01/21/20 0356 01/22/20 0335  Weight: (!) 143.7 kg (!) 136.9 kg 135.9 kg    Intake/Output:   Intake/Output Summary (Last 24 hours) at 01/22/2020 0818 Last data filed at 01/22/2020 0800 Gross per 24 hour  Intake 3045.51 ml  Output 4510 ml  Net -1464.49 ml     Physical Exam: General: Ill appearing on vent. Awake but not following commands  HEENT: normal +ETT Neck: supple. RIJ ECMO LIJ TLC Carotids 2+ bilat; no bruits. No lymphadenopathy or thryomegaly appreciated. Cor: Regular rate & rhythm. No rubs, gallops or murmurs. Lungs: minimal air movement Abdomen: obese soft, nontender, nondistended. No hepatosplenomegaly. No bruits or masses. Good bowel  sounds. Extremities: no cyanosis, clubbing, rash, 1-2+ edema +SCDs Neuro:awake. Not following commands    Telemetry: Sinus 80s Personally reviewed   Labs: Basic Metabolic Panel: Recent Labs  Lab 01/16/20 0343 01/16/20 0558 01/16/20 1100 01/16/20 1159 01/18/20 1700 01/18/20 1953 01/20/20 0347 01/20/20 0616 01/20/20 1653 01/20/20 1707 01/21/20 0355 01/21/20 0509 01/21/20 1522 01/21/20 1809 01/21/20 2215 01/22/20 0235 01/22/20 0409  NA 155*   < > 154*   < > 149*   < > 147*   < > 145   < > 147*   < > 149* 149* 148* 151* 149*  K 4.3   < > 5.7*   < > 5.4*   < > 4.6   < > 4.8   < > 4.1   < > 3.9 3.9 4.1 4.0 3.9  CL 112*   < > 113*   < > 113*   < > 110  --  106  --  104  --  102  --   --  104  --   CO2 35*   < > 34*   < > 26   < > 28  --  28  --  28  --  32  --   --  33*  --   GLUCOSE 168*   < > 181*   < > 336*   < > 171*  --  209*  --  155*  --  160*  --   --  169*  --   BUN 72*   < > 84*   < > 108*   < > 94*  --  95*  --  87*  --  84*  --   --  83*  --   CREATININE 0.74   < > 1.07*   < > 1.19*   < > 1.21*  --  1.21*  --  1.22*  --  1.16*  --   --  1.08*  --   CALCIUM 8.4*   < > 8.1*   < > 8.3*   < > 8.1*   < > 8.5*   < > 8.6*  --  9.3  --   --  9.0  --   MG 3.1*  --  2.9*  --  2.7*  --   --   --   --   --   --   --   --   --   --   --   --   PHOS 2.3*  --  4.7*  --   --   --   --   --   --   --   --   --   --   --   --   --   --    < > = values in this interval not displayed.    Liver Function Tests: Recent Labs  Lab 01/18/20 0355 01/19/20 0313 01/20/20 0347 01/21/20 0355 01/22/20 0235  AST 50* 147* 97* 93* 79*  ALT 54* 108* 104* 88* 62*  ALKPHOS 342* 214* 189* 167* 132*  BILITOT 1.3* 2.0* 1.7* 2.5* 2.2*  PROT 4.7* 4.4* 4.6* 5.1* 6.0*  ALBUMIN 3.1* 2.7* 2.5* 2.7* 4.5   No results for input(s): LIPASE, AMYLASE in the last 168 hours. No results for input(s): AMMONIA in the last 168 hours.  CBC: Recent Labs  Lab 01/20/20 0347 01/20/20 0616 01/20/20 1653  01/20/20 1707 01/21/20 0355 01/21/20 0509 01/21/20 1522 01/21/20 1809 01/21/20 2215 01/22/20 0235 01/22/20 0409  WBC 12.8*  --  11.8*  --  10.2  --  6.7  --   --  8.0  --   HGB 10.7*   < > 10.7*   < > 11.0*   < > 9.6* 9.9* 9.5* 9.1* 8.5*  HCT 33.7*   < > 34.9*   < > 34.9*   < > 31.4* 29.0* 28.0* 29.5* 25.0*  MCV 92.8  --  91.8  --  92.6  --  92.6  --   --  96.7  --   PLT 33*  --  30*  --  32*  --  30*  --   --  31*  --    < > = values in this interval not displayed.    Cardiac Enzymes: No results for input(s): CKTOTAL, CKMB, CKMBINDEX, TROPONINI in the last 168 hours.  BNP: BNP (last 3 results) No results for input(s): BNP in the last 8760 hours.  ProBNP (last 3 results) No results for input(s): PROBNP in the last 8760 hours.    Other results:  Imaging: CT HEAD WO CONTRAST  Result Date: 01/20/2020 CLINICAL DATA:  Intracranial hemorrhage, follow-up EXAM: CT HEAD WITHOUT CONTRAST TECHNIQUE: Contiguous axial images were obtained from the base of the skull through the vertex without intravenous contrast. COMPARISON:  01/19/2020 FINDINGS: Brain: Multifocal parenchymal hemorrhage is again identified. As noted previously, greatest involvement in the biparietal region with the largest  area of hemorrhage on the right measuring up to 3.7 cm as before. There is mild edema associated with the areas of hemorrhage without substantial mass effect. No new hemorrhage is identified. Gray-white differentiation remains preserved. Ventricles stable in size. Vascular: No new finding. Skull: Calvarium is unremarkable. Sinuses/Orbits: No acute finding. Other: Partially imaged facial subcutaneous edema. IMPRESSION: No substantial change in multifocal parenchymal hemorrhages. Regional mass effect remains mild. No new hemorrhage. Electronically Signed   By: Macy Mis M.D.   On: 01/20/2020 11:30   DG CHEST PORT 1 VIEW  Result Date: 01/22/2020 CLINICAL DATA:  On ECMO.  COVID-19 positive. EXAM: PORTABLE  CHEST 1 VIEW COMPARISON:  1 day prior FINDINGS: Endotracheal tube terminates 5.0 cm above carina. Nasogastric tube extends beyond the inferior aspect of the film. ECMO catheter unchanged in position. Left internal jugular line tip at low SVC. Right-sided PICC line is difficult to follow centrally. Diffuse white out of the chest, relatively similar with mild progression in the left upper lung. No pneumothorax. IMPRESSION: Minimally worsened left-sided aeration with complete whiteout of the chest. No pneumothorax or other acute complication. Electronically Signed   By: Abigail Miyamoto M.D.   On: 01/22/2020 08:02   DG CHEST PORT 1 VIEW  Result Date: 01/21/2020 CLINICAL DATA:  NG tube placement. EXAM: PORTABLE CHEST 1 VIEW COMPARISON:  01/21/2020 FINDINGS: Endotracheal tube with tip 4.2 cm above the carina. Right-sided PICC line has tip over the region of the SVC. Left IJ central venous catheter has tip over the SVC. Interval placement of enteric tube which courses into the region of the stomach and off the film as tip is not visualized. ECMO apparatus unchanged. Lungs are adequately inflated demonstrate persistent severe airspace consolidation bilaterally with air bronchograms and slightly improved aeration over the left apex. Findings likely due to combination of infection and ARDS. Possible associated bilateral effusions/bibasilar atelectasis. Remainder of the exam is unchanged. IMPRESSION: 1. Persistent severe bilateral airspace process with air bronchograms with slightly improved aeration over the left apex. Findings likely due to combination of infection and ARDS. Possible associated bilateral effusions/bibasilar atelectasis. 2. Tubes and lines as described. Electronically Signed   By: Marin Olp M.D.   On: 01/21/2020 13:51   DG CHEST PORT 1 VIEW  Result Date: 01/21/2020 CLINICAL DATA:  COVID-19 positivity with ECMO EXAM: PORTABLE CHEST 1 VIEW COMPARISON:  01/20/2020 FINDINGS: Endotracheal tube and left  jugular central line is well as ECMO cannula are again identified. Right-sided PICC line is seen and stable. Feeding catheter has been removed in the interval. Increasing opacity is noted throughout both lungs consistent with progression of consolidation. IMPRESSION: Increasing opacity in the lungs bilaterally. Tubes and lines as described. Electronically Signed   By: Inez Catalina M.D.   On: 01/21/2020 07:17     Medications:     Scheduled Medications: . sodium chloride   Intravenous Once  . artificial tears  1 application Both Eyes G0F  . vitamin C  500 mg Per Tube Daily  . chlorhexidine gluconate (MEDLINE KIT)  15 mL Mouth Rinse BID  . Chlorhexidine Gluconate Cloth  6 each Topical Daily  . clonazePAM  1 mg Per Tube BID  . docusate  100 mg Per Tube BID  . feeding supplement (PROSource TF)  45 mL Per Tube BID  . fentaNYL (SUBLIMAZE) injection  50 mcg Intravenous Once  . free water  300 mL Per Tube Q4H  . linagliptin  5 mg Per Tube Daily  . mouth rinse  15 mL Mouth Rinse 10 times per day  . methylPREDNISolone (SOLU-MEDROL) injection  40 mg Intravenous Daily  . nutrition supplement (JUVEN)  1 packet Per Tube BID BM  . oxyCODONE  5 mg Per Tube Q6H  . pantoprazole sodium  40 mg Per Tube Daily  . polyethylene glycol  17 g Per Tube Daily  . sennosides  5 mL Per Tube BID  . sodium chloride flush  10-40 mL Intracatheter Q12H  . zinc sulfate  220 mg Per Tube Daily    Infusions: . sodium chloride 250 mL (01/21/20 1227)  . sodium chloride Stopped (01/14/2020 1009)  . albumin human 12.5 g (01/21/20 2125)  . dexmedetomidine (PRECEDEX) IV infusion 1.2 mcg/kg/hr (01/22/20 0800)  . feeding supplement (PIVOT 1.5 CAL) 1,000 mL (01/21/20 1900)  . fentaNYL infusion INTRAVENOUS 400 mcg/hr (01/22/20 0800)  . insulin 4.4 mL/hr at 01/22/20 0800  . norepinephrine (LEVOPHED) Adult infusion Stopped (01/22/20 0551)  . piperacillin-tazobactam (ZOSYN)  IV Stopped (01/22/20 0531)    PRN Medications: sodium  chloride, acetaminophen (TYLENOL) oral liquid 160 mg/5 mL, albumin human, albuterol, dextrose, fentaNYL, fentaNYL, guaiFENesin-dextromethorphan, hydrALAZINE, hydrOXYzine, ondansetron **OR** ondansetron (ZOFRAN) IV, sodium chloride flush   Assessment/Plan:   1. Acute hypoxic/hypercapneic respiratory failure in setting of severe COVID PNA/ARDS -> VV ECMO - admit 8/2 - intubation 8/3 - has received actmera (compelted 8/2), remdesivir (completed 8/6) and steroids - failed full vent support with proning/paralytic - Cannulated for VV ECMO on 8/9 - Volume status much improved with IV lasix but began having severe chugging. Lasix gtt now off. ECMO flows turned down. Sats low 90s.  - More agitated on vent so sedation increased. Adjust as needed - Off bival due to [place]. Watch circuit closely for clotting. D/w Neurology. Would not be a candidate for any AC for weeks to months given size of ICH - Elevated LDH likely due to sepsis. Now stable - PLTs stable at 30k. Fibrinogen falling. Concern for DIC or pump cause. Check DIC panel.    - Remains very tenuous with MSSA PNA, enterococcus sepsis, persistent volume overload and now ICH. Family aware of gravity of situation. Want to maintain all interventions for now.   - With need to stop [facility] we discussed possibility of coming off ECMO but doubt she would survive without ECMO support at this point  - Given underlying OHS we have allowed pCO2 to drift up to relieve acidosis. Likely trach today if clotting factors permit. Reluctant to give PLTs with increased risk of circuit clot  2. Enterococcus sepsis - continue vanc + high dose zosyn per pharmacy   3. Intracranial hemorrhage - ? Septic emboli - repeat head CT on 8/14 with stable bleeds but increased edema - neurology following. Suspect significant long-term injury sustained. Will follow commands. Appears to have dense LUE weakness and possibly LLE - Per Neurology - suggest repeat head CT soon. Will hold off  today as will not change management - No AC with ICH - PT/OT consult - Will need TEE when platelets up   4. Thrombocytopenia - suspect due to sepsis.  - no obvious bleeding - would not transfuse PLTs unless absolutely necessary given risk of clotting pump  4. Morbid obesity - Body mass index is 49.38 kg/m.  5. Poorly controlled DM2 - HgBA1c 10.7 - CBGs remain elevated - On IV insulin adjust as needed  4. Hypernatremia - improved. Stop free water boluses given ICH and benefit of permissive hypernatremia  5. Lactic acidosis -improved   CRITICAL  CARE Performed by: Glori Bickers  Total critical care time: 40 minutes  Critical care time was exclusive of separately billable procedures and treating other patients.  Critical care was necessary to treat or prevent imminent or life-threatening deterioration.  Critical care was time spent personally by me (independent of midlevel providers or residents) on the following activities: development of treatment plan with patient and/or surrogate as well as nursing, discussions with consultants, evaluation of patient's response to treatment, examination of patient, obtaining history from patient or surrogate, ordering and performing treatments and interventions, ordering and review of laboratory studies, ordering and review of radiographic studies, pulse oximetry and re-evaluation of patient's condition.   Length of Stay: 14   Glori Bickers  MD 01/22/2020, 8:18 AM  Advanced Heart Failure Team Pager [phone] (M-F; [place])  Please contact [facility] for night-coverage after hours (4p -7a ) and weekends on [email or web address]

## 2020-01-22 NOTE — Progress Notes (Signed)
Assisted family with tele-visit via elink 

## 2020-01-22 NOTE — Procedures (Signed)
Cortrak  Person Inserting Tube:  Maylon Peppers C, RD Tube Type:  Cortrak - 43 inches Tube Location:  Right nare Initial Placement:  Stomach Secured by: Clip Technique Used to Measure Tube Placement:  Documented cm marking at nare/ corner of mouth Cortrak Secured At:  69 cm    Cortrak Tube Team Note:  Consult received to place a Cortrak feeding tube.  Unable to place bridle due to bleeding risk. Bridle left at bedside for future use as needed.   No x-ray is required. RN may begin using tube.   If the tube becomes dislodged please keep the tube and contact the Cortrak team at [email or web address] ([email or web address]) for replacement.  If after hours and replacement cannot be delayed, place a NG tube and confirm placement with an abdominal x-ray.    Lockie Pares., RD, LDN, CNSC See AMiON for contact information

## 2020-01-22 NOTE — Progress Notes (Signed)
OT Cancellation Note  Patient Details Name: Alisha Stephenson MRN: [id number] DOB: [birth year]   Cancelled Treatment:    Reason Eval/Treat Not Completed: Other (comment); pt to go for trach today. Will check back tomorrow for OT eval.  Lou Cal, OT Acute Rehabilitation Services Pager [phone] Office [place] 01/22/2020, 10:22 AM

## 2020-01-22 NOTE — Progress Notes (Signed)
Patient was transported from [place] to CT on oxygen tank and battery power. Emergency medications and supplies were available at all times. Upon arrival back to 2H25 to destination, circuit was connected to wall oxygen source and plugged into a red power outlet. Transport was uneventful.

## 2020-01-22 NOTE — Procedures (Signed)
Extubation Procedure Note  Patient Details:   Name: Alisha Stephenson DOB: 26-Oct-1965 MRN: [id number]   Airway Documentation:    Vent end date: 01/22/20 Vent end time: 1450   Evaluation  O2 sats: stable throughout Complications: No apparent complications Patient did tolerate procedure well. Bilateral Breath Sounds: Rhonchi, Other (Comment), Diminished (coarse)   No   COVID ECMO patient extubated from vent and placed on Heated High Flow [place] at 25L, 100% per dr. Keturah Barre. [name] request.  Patient has a weak productive cough and was suctioned several times with a Yankauer and a suction catheter. Patient fairly well.     Bayard Beaver 01/22/2020, 3:36 PM

## 2020-01-22 NOTE — Progress Notes (Signed)
Patient transported on vent to CT and returned to 2H25 without complications.  

## 2020-01-22 NOTE — Progress Notes (Signed)
PT Cancellation Note  Patient Details Name: Alisha Stephenson MRN: [id number] DOB: Sep 18, 1965   Cancelled Treatment:    Reason Eval/Treat Not Completed: Patient not medically ready (pt on ECMO with ETT with planned trach. Per RN hold til after trach due to anxiety and ability to have more active participation)   Lamarr Lulas 01/22/2020, 10:11 AM  [place] Pager: [phone] Office: [phone]

## 2020-01-22 NOTE — Progress Notes (Signed)
Given extremely low fibrinogen, continuing lowering of platelets, and oozing from skin tears, going to see if we can extubate patient. She may need a trach down the line but at that point we will (hopefuly) be on [facility] and can give some platelets + cryoprecipitate.  Erskine Emery MD PCCM

## 2020-01-22 NOTE — Progress Notes (Signed)
Assisted family with E-Link tele visit.

## 2020-01-22 NOTE — CV Procedure (Signed)
ECMO NOTE:  Indication: Respiratory failure due to COVID PNA  Initial cannulation date: 01/16/2020  ECMO type: VV ECMO (Centrimag with oximizer)  Dual lumen Inflow/return cannula: 86 FR RIJ [place] placed 02/02/2020  ECMO events:  - Initial cannulation 01/23/2020 - Cannula repositioned (pulled back ) 01/17/20 - Off bival 8/13 due to [place]  Daily data:  Flow3.5 RPM 3000 Sweep4 Delta   Labs:  7.39/63/68 Hgbstable at 9 Platelets 32>>31 LDH 608 (pre-cannulation) ->558 ->543 ->501 ->623 -> 1,110-->1103-->734-->585>>639 PTT (off bival 2/2 bleeding) = 52 Lactic acid2.7 -> 2.5 -> 2.8-> 2.1 -> 3.3-> 2.1->1.5--> 1.6 >> 1.5  Cr 1.1>>1.0 Na 149>>151   Assessment: -Severe COVID ARDS s/p VA ECMO -Enterococcal bacteremia -MSSA pneumonia -Thrombocytopenia- some combination of hemolysis, bone marrow stunning, and sepsis effect - Muscular deconditioning  Plan: -Continue VV ECMO support. -Zosyn with duration TBD -Start FWF, discuss further diuresis today -Discussion with neurology regarding timing of head imaging and AC challenge -PT & OT consults.  Erskine Emery MD PCCM

## 2020-01-22 NOTE — Progress Notes (Signed)
STROKE TEAM PROGRESS NOTE   INTERVAL HISTORY Patient remains critically ill with respiratory failure needing ECMO.  Tracheostomy is postponed given extremely low fibrinogen and platelets and oozing from skin tears.  Neurologically Alisha Stephenson is arousable and follows simple midline commands by shaking Alisha Stephenson head to yes and no and bending Alisha Stephenson toes and fingers to command.  Follow-up CT scan of the head performed today shows continuing stability of multifocal intracerebral hemorrhages with mild edema but no worsening.   OBJECTIVE Vitals:   01/22/20 0600 01/22/20 0700 01/22/20 0800 01/22/20 0837  BP: 115/72 (!) 135/52 (!) 139/57 (!) 91/51  Pulse: 65 87 (!) 123 75  Resp: 12 (!) 21 19 19   Temp: (!) 95.7 F (35.4 C) (!) 95.7 F (35.4 C) (!) 95.7 F (35.4 C) (!) 95.7 F (35.4 C)  TempSrc:   Bladder   SpO2: 91% (!) 83% (!) 82% (!) 87%  Weight:      Height:       CBC:  Recent Labs  Lab 01/21/20 1522 01/21/20 1809 01/22/20 0235 01/22/20 0409 01/22/20 0923  WBC 6.7  --  8.0  --   --   HGB 9.6*   < > 9.1* 8.5*  --   HCT 31.4*   < > 29.5* 25.0*  --   MCV 92.6  --  96.7  --   --   PLT 30*   < > 31*  --  29*   < > = values in this interval not displayed.   Basic Metabolic Panel:  Recent Labs  Lab 01/16/20 0343 01/16/20 0558 01/16/20 1100 01/16/20 1159 01/18/20 1700 01/18/20 1953 01/21/20 1522 01/21/20 1809 01/22/20 0235 01/22/20 0409  NA 155*   < > 154*   < > 149*   < > 149*   < > 151* 149*  K 4.3   < > 5.7*   < > 5.4*   < > 3.9   < > 4.0 3.9  CL 112*   < > 113*   < > 113*   < > 102  --  104  --   CO2 35*   < > 34*   < > 26   < > 32  --  33*  --   GLUCOSE 168*   < > 181*   < > 336*   < > 160*  --  169*  --   BUN 72*   < > 84*   < > 108*   < > 84*  --  83*  --   CREATININE 0.74   < > 1.07*   < > 1.19*   < > 1.16*  --  1.08*  --   CALCIUM 8.4*   < > 8.1*   < > 8.3*   < > 9.3  --  9.0  --   MG 3.1*   < > 2.9*  --  2.7*  --   --   --   --   --   PHOS 2.3*  --  4.7*  --   --   --   --    --   --   --    < > = values in this interval not displayed.   Lipid Panel:     Component Value Date/Time   CHOL 177 09/04/2019 1522   TRIG 341 (H) 01/10/2020 0346   HDL 32.80 (L) 09/04/2019 1522   CHOLHDL 5 09/04/2019 1522   VLDL 55.4 (H) 09/04/2019 1522   LDLCALC 79  07/05/2017 0908   HgbA1c:  Lab Results  Component Value Date   HGBA1C 10.7 (H) 02/06/2020   Urine Drug Screen: No results found for: LABOPIA, COCAINSCRNUR, LABBENZ, AMPHETMU, THCU, LABBARB  Alcohol Level No results found for: [place] 01/19/2020 Multifocal parenchymal hemorrhages, with the largest within the right parietal lobe. Together with associated edema, there is mild regional mass effect but no herniation. Considerations include sequelae of ECMO anticoagulation, hemorrhagic conversion of embolic infarcts, and vasculitis/vasculopathy.   CT HEAD WO CONTRAST 01/20/2020 No substantial change in multifocal parenchymal hemorrhages. Regional mass effect remains mild. No new hemorrhage.   CT HEAD WO CONTRAST 01/22/2020 Continued stability with no substantial change in multifocal parenchymal hemorrhages and associated mild regional mass effect. No new hemorrhage   CT CHEST ABDOMEN PELVIS W CONTRAST 01/19/2020 1. Dense airspace opacity throughout both lungs with air bronchograms, compatible with the clinical diagnosis of COVID ARDS. Dense bilateral pneumonia could also have this appearance.  2. Small to moderate-sized right pleural effusion and small left pleural effusion.  3. Small amount of ascites.  4. Colonic diverticulosis.   Transthoracic Echocardiogram  01/16/2020 1. Left ventricular ejection fraction, by estimation, is >75%. The left ventricle has hyperdynamic function. The left ventricle has no regional wall motion abnormalities. Left ventricular diastolic function could not be evaluated.  2. Right ventricular systolic function is hyperdynamic. The right ventricular size is normal.  Tricuspid regurgitation signal is inadequate for assessing PA pressure.  3. The mitral valve is grossly normal. No evidence of mitral valve regurgitation.  4. The aortic valve was not well visualized. Aortic valve regurgitation is not visualized.  5. The inferior vena cava is normal in size with <50% respiratory variability, suggesting right atrial pressure of 8 mmHg.   ECG - ST rate 132 BPM. (See cardiology reading for complete details)  PHYSICAL EXAM    Obese middle-aged Caucasian Alisha Stephenson who is intubated and mildly sedated. Afebrile. Head is nontraumatic. Neck is supple without bruit.    Cardiac exam no murmur or gallop. Lungs are clear to auscultation. Distal pulses are well felt. Neurological Exam : Patient is intubated and presently off sedation..  Alisha Stephenson opens eyes to verbal stimuli.  Alisha Stephenson nods Alisha Stephenson head appropriately to yes and no.  Eyes are disconjugate with left eyes slight hypertropia.  Alisha Stephenson follows gaze in all directions with slight limitation of upgaze.  Alisha Stephenson blinks to threat on both sides pupils 3 mm sluggishly reactive.    Weak cough and gag.  No spontaneous extremity movements but will wiggle toes and fingers to command in all 4 extremities.  Movements in left hand and leg appear less than and right arm and leg.   Plantars are equivocal bilaterally.   ASSESSMENT/PLAN Alisha Stephenson is a 54 y.o. female with history of diabetes and morbid obesity admitted for Covid pneumonia.  Alisha Stephenson developed encephalopathy and a CT of the head revealed multifocal ICHs with largest within the right frontoparietal area.  Alisha Stephenson did not receive IV t-PA due to [place].  Multifocal parenchymal hemorrhages.  Resultant mild left hemiparesis  CT head - 01/20/20 - multifocal parenchymal hemorrhages.   2D Echo - EF > 75 %. No cardiac source of emboli identified.   Sars Corona Virus 2 - positive  LDL - triglycerides 341  HgbA1c - 10.7  VTE prophylaxis - SCDs Diet  Diet Order            Diet NPO time  specified  Diet effective midnight  Diet NPO time specified  Diet effective now                 No antithrombotic prior to admission, now on No antithrombotic due to intracerebral hemorrhage and low platelet count  Therapy recommendations:  pending  Disposition:  Pending  Hypertension  Home BP meds: none   Current BP meds: Levophed  Unstable . Long-term BP goal normotensive  Hyperlipidemia  Home Lipid lowering medication: none   LDL triglycerides 341, goal < 70  Current lipid lowering medication:  None (statin contraindicated with ICH)  Continue statin at discharge  Diabetes  Home diabetic meds: insulin  Current diabetic meds: Tradjenta and insulin  HgbA1c 10.7, goal < 7.0 Recent Labs    01/22/20 0313 01/22/20 0934 01/22/20 0945  GLUCAP 169* 245* 233*    Other Stroke Risk Factors  Former cigarette smoker - quit  Obesity, Body mass index is 51.43 kg/m., recommend weight loss, diet and exercise as appropriate   Other Active Problems  Code status - Limited code   Palliative Care Consult - 01/20/20 -> Family is not ready to transition to comfort care at this time.   Summary of Recommendations From Palliative Care:  No CPR/ACLS in the event of cardiac arrest   Family will need ongoing support   In the event of rapid decline, please allow for 3 visitors at bedside (per visitation guidelines)  Palliative Care will follow-up with family  Sunday  Anemia - Hgb - 9.9->10.2  Multi organ system failure  AKI - creatinine - 1.21->1.22  Intubated / ECMO  Bradycardia   Hospital day # 14 Patient has developed multiple intracerebral hemorrhages in the setting of severe Covid infection, enterococcal sepsis and usage of anticoagulation for ECMO.  Exact etiology of the hemorrhage is unclear but likely due to combination of the above.  Alisha Stephenson neurological prognosis is guarded but Alisha Stephenson is likely to have some residual cognitive deficits from this but Alisha Stephenson  primary prognosis is likely driven by Alisha Stephenson severe Covid infection and refractory hypoxia requiring ECMO.  Patient will likely need anticoagulation for efficient use of ECMO as it will likely clots soon if anticoagulation is withheld for longer period of time.  Alisha Stephenson has now been off anticoagulation for nearly 72 hours with stability of Alisha Stephenson hemorrhages on the follow-up brain scan from today.  I recommend use lowest possible dose of anticoagulation for efficient use of Alisha Stephenson ECMO but this will carry risk of increasing Alisha Stephenson existing brain hemorrhages and putting Alisha Stephenson at risk for future hemorrhages as well.  Since patient's survival depends on the ECMO which is not likely to function efficiently without anticoagulation we may have to no choice but to resume anticoagulation.  Recommend check CT scan of the head tomorrow if hemorrhage is stable may resume lowest possible dose of anticoagulation.  Long discussion with Dr. Haroldine Laws and Dr. Tamala Julian and answered questions.  I am available to talk to family if needed This patient is critically ill and at significant risk of neurological worsening, death and care requires constant monitoring of vital signs, hemodynamics,respiratory and cardiac monitoring, extensive review of multiple databases, frequent neurological assessment, discussion with family, other specialists and medical decision making of high complexity.I have made any additions or clarifications directly to the above note.This critical care time does not reflect procedure time, or teaching time or supervisory time of PA/NP/Med Resident etc but could involve care discussion time.  I spent 30 minutes of neurocritical care time  in the care of  this patient.   Antony Contras, MD     To contact Stroke Continuity provider, please refer to [email or web address] After hours, contact General Neurology

## 2020-01-22 NOTE — Progress Notes (Signed)
ANTICOAGULATION CONSULT NOTE - Initial Consult  Pharmacy Consult for heparin Indication: ECMO  Allergies  Allergen Reactions  . Sulfa Antibiotics Rash and Other (See Comments)    Patient Measurements: Height: 5\' 4"  (162.6 cm) Weight: 135.9 kg (299 lb 9.7 oz) IBW/kg (Calculated) : 54.7 Heparin Dosing Weight: 87kg  Vital Signs: Temp: 97.3 F (36.3 C) (08/16 1600) Temp Source: Bladder (08/16 0800) BP: 91/51 (08/16 0837) Pulse Rate: 84 (08/16 1600)  Labs: Recent Labs    01/21/20 0355 01/21/20 0509 01/21/20 1522 01/21/20 1809 01/22/20 0235 01/22/20 0409 01/22/20 0923 01/22/20 0932 01/22/20 1135 01/22/20 1135 01/22/20 1550 01/22/20 1600  HGB 11.0*   < > 9.6*   < > 9.1*   < >  --    < > 8.5*   < > 9.5* 9.2*  HCT 34.9*   < > 31.4*   < > 29.5*   < >  --    < > 25.0*  --  28.0* 31.1*  PLT 32*   < > 30*   < > 31*  --  29*  --   --   --   --  37*  APTT  --   --   --   --   --   --  45*  --   --   --   --   --   LABPROT 17.9*  --   --   --  20.9*  --  21.6*  --   --   --   --   --   INR 1.5*  --   --   --  1.9*  --  2.0*  --   --   --   --   --   HEPARINUNFRC  --   --   --   --   --   --   --   --   --   --   --  <0.10*  CREATININE 1.22*  --  1.16*  --  1.08*  --   --   --   --   --   --   --    < > = values in this interval not displayed.    Estimated Creatinine Clearance: 81 mL/min (A) (by C-G formula based on SCr of 1.08 mg/dL (H)).   Medical History: Past Medical History:  Diagnosis Date  . Asthma   . Diabetes mellitus without complication ([place])   . Diverticulitis   . Gallstones   . IBS (irritable bowel syndrome)   . NAFLD (nonalcoholic fatty liver disease)    CT scan 2015  . Ovarian cyst     Assessment: 79 yoF with COVID and on VV-ECMO. Pt initially on bivalirudin which was stopped 8/13 with ICH. CT head 8/16 is stable, per neuro and CCM ok to begin IV heparin.   Baseline heparin level undetectable as expected, pltc remains low, baseline aPTT slightly  elevated. Given recent [place] will start heparin very conservatively without bolus and target heparin level ~0.2 for now.  Goal of Therapy:  Heparin level 0.2 units/ml Monitor platelets by anticoagulation protocol: Yes   Plan:  -Heparin 500 units/h no bolus -Check heparin level in 6h  Arrie Senate, PharmD, BCPS Clinical Pharmacist [phone] Please check AMION for all [place] numbers 01/22/2020

## 2020-01-23 ENCOUNTER — Inpatient Hospital Stay (HOSPITAL_COMMUNITY): Payer: PRIVATE HEALTH INSURANCE

## 2020-01-23 DIAGNOSIS — J80 Acute respiratory distress syndrome: Secondary | ICD-10-CM | POA: Diagnosis not present

## 2020-01-23 DIAGNOSIS — U071 COVID-19: Secondary | ICD-10-CM | POA: Diagnosis not present

## 2020-01-23 LAB — POCT I-STAT 7, (LYTES, BLD GAS, ICA,H+H)
Acid-Base Excess: 8 mmol/L — ABNORMAL HIGH (ref 0.0–2.0)
Acid-Base Excess: 7 mmol/L — ABNORMAL HIGH (ref 0.0–2.0)
Acid-Base Excess: 9 mmol/L — ABNORMAL HIGH (ref 0.0–2.0)
Acid-Base Excess: 8 mmol/L — ABNORMAL HIGH (ref 0.0–2.0)
Acid-Base Excess: 7 mmol/L — ABNORMAL HIGH (ref 0.0–2.0)
Acid-Base Excess: 11 mmol/L — ABNORMAL HIGH (ref 0.0–2.0)
Bicarbonate: 32.8 mmol/L — ABNORMAL HIGH (ref 20.0–28.0)
Bicarbonate: 32.7 mmol/L — ABNORMAL HIGH (ref 20.0–28.0)
Bicarbonate: 34.4 mmol/L — ABNORMAL HIGH (ref 20.0–28.0)
Bicarbonate: 33.9 mmol/L — ABNORMAL HIGH (ref 20.0–28.0)
Bicarbonate: 33.2 mmol/L — ABNORMAL HIGH (ref 20.0–28.0)
Bicarbonate: 35.2 mmol/L — ABNORMAL HIGH (ref 20.0–28.0)
Calcium, Ion: 1.18 mmol/L (ref 1.15–1.40)
Calcium, Ion: 1.2 mmol/L (ref 1.15–1.40)
Calcium, Ion: 1.18 mmol/L (ref 1.15–1.40)
Calcium, Ion: 1.2 mmol/L (ref 1.15–1.40)
Calcium, Ion: 1.21 mmol/L (ref 1.15–1.40)
Calcium, Ion: 1.2 mmol/L (ref 1.15–1.40)
HCT: 25 % — ABNORMAL LOW (ref 36.0–46.0)
HCT: 25 % — ABNORMAL LOW (ref 36.0–46.0)
HCT: 24 % — ABNORMAL LOW (ref 36.0–46.0)
HCT: 24 % — ABNORMAL LOW (ref 36.0–46.0)
HCT: 25 % — ABNORMAL LOW (ref 36.0–46.0)
HCT: 23 % — ABNORMAL LOW (ref 36.0–46.0)
Hemoglobin: 8.5 g/dL — ABNORMAL LOW (ref 12.0–15.0)
Hemoglobin: 8.5 g/dL — ABNORMAL LOW (ref 12.0–15.0)
Hemoglobin: 8.2 g/dL — ABNORMAL LOW (ref 12.0–15.0)
Hemoglobin: 8.2 g/dL — ABNORMAL LOW (ref 12.0–15.0)
Hemoglobin: 8.5 g/dL — ABNORMAL LOW (ref 12.0–15.0)
Hemoglobin: 7.8 g/dL — ABNORMAL LOW (ref 12.0–15.0)
O2 Saturation: 89 %
O2 Saturation: 88 %
O2 Saturation: 88 %
O2 Saturation: 88 %
O2 Saturation: 92 %
O2 Saturation: 98 %
Patient temperature: 36.3
Patient temperature: 36.4
Patient temperature: 35.9
Patient temperature: 36
Patient temperature: 36.1
Patient temperature: 35.9
Potassium: 4 mmol/L (ref 3.5–5.1)
Potassium: 4.1 mmol/L (ref 3.5–5.1)
Potassium: 4.2 mmol/L (ref 3.5–5.1)
Potassium: 3.7 mmol/L (ref 3.5–5.1)
Potassium: 3.9 mmol/L (ref 3.5–5.1)
Potassium: 4.3 mmol/L (ref 3.5–5.1)
Sodium: 150 mmol/L — ABNORMAL HIGH (ref 135–145)
Sodium: 149 mmol/L — ABNORMAL HIGH (ref 135–145)
Sodium: 151 mmol/L — ABNORMAL HIGH (ref 135–145)
Sodium: 151 mmol/L — ABNORMAL HIGH (ref 135–145)
Sodium: 151 mmol/L — ABNORMAL HIGH (ref 135–145)
Sodium: 151 mmol/L — ABNORMAL HIGH (ref 135–145)
TCO2: 34 mmol/L — ABNORMAL HIGH (ref 22–32)
TCO2: 34 mmol/L — ABNORMAL HIGH (ref 22–32)
TCO2: 36 mmol/L — ABNORMAL HIGH (ref 22–32)
TCO2: 35 mmol/L — ABNORMAL HIGH (ref 22–32)
TCO2: 35 mmol/L — ABNORMAL HIGH (ref 22–32)
TCO2: 37 mmol/L — ABNORMAL HIGH (ref 22–32)
pCO2 arterial: 47.3 mmHg (ref 32.0–48.0)
pCO2 arterial: 49.9 mmHg — ABNORMAL HIGH (ref 32.0–48.0)
pCO2 arterial: 48.1 mmHg — ABNORMAL HIGH (ref 32.0–48.0)
pCO2 arterial: 51.9 mmHg — ABNORMAL HIGH (ref 32.0–48.0)
pCO2 arterial: 54.7 mmHg — ABNORMAL HIGH (ref 32.0–48.0)
pCO2 arterial: 45.8 mmHg (ref 32.0–48.0)
pH, Arterial: 7.446 (ref 7.350–7.450)
pH, Arterial: 7.422 (ref 7.350–7.450)
pH, Arterial: 7.459 — ABNORMAL HIGH (ref 7.350–7.450)
pH, Arterial: 7.418 (ref 7.350–7.450)
pH, Arterial: 7.388 (ref 7.350–7.450)
pH, Arterial: 7.49 — ABNORMAL HIGH (ref 7.350–7.450)
pO2, Arterial: 53 mmHg — ABNORMAL LOW (ref 83.0–108.0)
pO2, Arterial: 54 mmHg — ABNORMAL LOW (ref 83.0–108.0)
pO2, Arterial: 50 mmHg — ABNORMAL LOW (ref 83.0–108.0)
pO2, Arterial: 52 mmHg — ABNORMAL LOW (ref 83.0–108.0)
pO2, Arterial: 63 mmHg — ABNORMAL LOW (ref 83.0–108.0)
pO2, Arterial: 89 mmHg (ref 83.0–108.0)

## 2020-01-23 LAB — CBC
HCT: 27.5 % — ABNORMAL LOW (ref 36.0–46.0)
HCT: 27.6 % — ABNORMAL LOW (ref 36.0–46.0)
Hemoglobin: 8.2 g/dL — ABNORMAL LOW (ref 12.0–15.0)
Hemoglobin: 8.1 g/dL — ABNORMAL LOW (ref 12.0–15.0)
MCH: 29.5 pg (ref 26.0–34.0)
MCH: 29.5 pg (ref 26.0–34.0)
MCHC: 29.8 g/dL — ABNORMAL LOW (ref 30.0–36.0)
MCHC: 29.3 g/dL — ABNORMAL LOW (ref 30.0–36.0)
MCV: 98.9 fL (ref 80.0–100.0)
MCV: 100.4 fL — ABNORMAL HIGH (ref 80.0–100.0)
Platelets: 38 10*3/uL — ABNORMAL LOW (ref 150–400)
Platelets: 40 10*3/uL — ABNORMAL LOW (ref 150–400)
RBC: 2.78 MIL/uL — ABNORMAL LOW (ref 3.87–5.11)
RBC: 2.75 MIL/uL — ABNORMAL LOW (ref 3.87–5.11)
RDW: 17.9 % — ABNORMAL HIGH (ref 11.5–15.5)
RDW: 18.3 % — ABNORMAL HIGH (ref 11.5–15.5)
WBC: 6.6 10*3/uL (ref 4.0–10.5)
WBC: 6.4 10*3/uL (ref 4.0–10.5)
nRBC: 2 % — ABNORMAL HIGH (ref 0.0–0.2)
nRBC: 2.5 % — ABNORMAL HIGH (ref 0.0–0.2)

## 2020-01-23 LAB — FIBRINOGEN: Fibrinogen: 145 mg/dL — ABNORMAL LOW (ref 210–475)

## 2020-01-23 LAB — GLOBAL TEG PANEL
CK with Heparinase (R): 8.3 min (ref 4.3–8.3)
Citrated Kaolin (K): >5 min — ABNORMAL HIGH (ref 0.8–2.1)
Citrated Kaolin (MA): <40 mm — ABNORMAL LOW (ref 52–69)
Citrated Kaolin (R): 7.5 min (ref 4.6–9.1)
Citrated Kaolin Angle: 48.3 deg — ABNORMAL LOW (ref 63–78)
Citrated Rapid TEG (MA): <40 mm — ABNORMAL LOW (ref 52–70)

## 2020-01-23 LAB — GLUCOSE, CAPILLARY
Glucose-Capillary: 262 mg/dL — ABNORMAL HIGH (ref 70–99)
Glucose-Capillary: 298 mg/dL — ABNORMAL HIGH (ref 70–99)
Glucose-Capillary: 225 mg/dL — ABNORMAL HIGH (ref 70–99)
Glucose-Capillary: 269 mg/dL — ABNORMAL HIGH (ref 70–99)
Glucose-Capillary: 245 mg/dL — ABNORMAL HIGH (ref 70–99)
Glucose-Capillary: 197 mg/dL — ABNORMAL HIGH (ref 70–99)
Glucose-Capillary: 216 mg/dL — ABNORMAL HIGH (ref 70–99)
Glucose-Capillary: 195 mg/dL — ABNORMAL HIGH (ref 70–99)
Glucose-Capillary: 171 mg/dL — ABNORMAL HIGH (ref 70–99)

## 2020-01-23 LAB — HEPATIC FUNCTION PANEL
ALT: 64 U/L — ABNORMAL HIGH (ref 0–44)
AST: 49 U/L — ABNORMAL HIGH (ref 15–41)
Albumin: 3.9 g/dL (ref 3.5–5.0)
Alkaline Phosphatase: 133 U/L — ABNORMAL HIGH (ref 38–126)
Bilirubin, Direct: 0.5 mg/dL — ABNORMAL HIGH (ref 0.0–0.2)
Indirect Bilirubin: 1.4 mg/dL — ABNORMAL HIGH (ref 0.3–0.9)
Total Bilirubin: 1.9 mg/dL — ABNORMAL HIGH (ref 0.3–1.2)
Total Protein: 5.5 g/dL — ABNORMAL LOW (ref 6.5–8.1)

## 2020-01-23 LAB — BASIC METABOLIC PANEL
Anion gap: 11 (ref 5–15)
Anion gap: 11 (ref 5–15)
BUN: 97 mg/dL — ABNORMAL HIGH (ref 6–20)
BUN: 113 mg/dL — ABNORMAL HIGH (ref 6–20)
CO2: 31 mmol/L (ref 22–32)
CO2: 29 mmol/L (ref 22–32)
Calcium: 8.9 mg/dL (ref 8.9–10.3)
Calcium: 8.7 mg/dL — ABNORMAL LOW (ref 8.9–10.3)
Chloride: 106 mmol/L (ref 98–111)
Chloride: 106 mmol/L (ref 98–111)
Creatinine, Ser: 1.13 mg/dL — ABNORMAL HIGH (ref 0.44–1.00)
Creatinine, Ser: 1.09 mg/dL — ABNORMAL HIGH (ref 0.44–1.00)
GFR calc Af Amer: >60 mL/min (ref >60)
GFR calc Af Amer: >60 mL/min (ref >60)
GFR calc non Af Amer: 55 mL/min — ABNORMAL LOW (ref >60)
GFR calc non Af Amer: 57 mL/min — ABNORMAL LOW (ref >60)
Glucose, Bld: 286 mg/dL — ABNORMAL HIGH (ref 70–99)
Glucose, Bld: 200 mg/dL — ABNORMAL HIGH (ref 70–99)
Potassium: 4.1 mmol/L (ref 3.5–5.1)
Potassium: 4 mmol/L (ref 3.5–5.1)
Sodium: 148 mmol/L — ABNORMAL HIGH (ref 135–145)
Sodium: 146 mmol/L — ABNORMAL HIGH (ref 135–145)

## 2020-01-23 LAB — BPAM CRYOPRECIPITATE
Blood Product Expiration Date: 202108170020
ISSUE DATE / TIME: 202108161903
Unit Type and Rh: 5100

## 2020-01-23 LAB — LACTATE DEHYDROGENASE: LDH: 494 U/L — ABNORMAL HIGH (ref 98–192)

## 2020-01-23 LAB — PREPARE CRYOPRECIPITATE: Unit division: 0

## 2020-01-23 LAB — APTT: aPTT: 34 seconds (ref 24–36)

## 2020-01-23 LAB — LACTIC ACID, PLASMA
Lactic Acid, Venous: 2 mmol/L (ref 0.5–1.9)
Lactic Acid, Venous: 2.2 mmol/L (ref 0.5–1.9)
Lactic Acid, Venous: 1.6 mmol/L (ref 0.5–1.9)

## 2020-01-23 LAB — PROTIME-INR
INR: 1.5 — ABNORMAL HIGH (ref 0.8–1.2)
Prothrombin Time: 17.4 seconds — ABNORMAL HIGH (ref 11.4–15.2)

## 2020-01-23 LAB — MAGNESIUM: Magnesium: 2.9 mg/dL — ABNORMAL HIGH (ref 1.7–2.4)

## 2020-01-23 MED ORDER — PIPERACILLIN-TAZOBACTAM 4.5 G IVPB
4.5000 g | Freq: Four times a day (QID) | INTRAVENOUS | Status: DC
  Administered 2020-01-23 – 2020-01-28 (×18): 4.5 g via INTRAVENOUS
  Filled 2020-01-23 (×20): qty 100

## 2020-01-23 MED ORDER — MIDAZOLAM HCL 2 MG/2ML IJ SOLN
INTRAMUSCULAR | Status: AC
  Filled 2020-01-23: qty 2

## 2020-01-23 MED ORDER — QUETIAPINE FUMARATE 50 MG PO TABS
50.0000 mg | ORAL_TABLET | Freq: Two times a day (BID) | ORAL | Status: DC
  Administered 2020-01-23 – 2020-01-24 (×3): 50 mg
  Filled 2020-01-23 (×3): qty 1

## 2020-01-23 MED ORDER — OXYCODONE HCL 5 MG/5ML PO SOLN
10.0000 mg | Freq: Four times a day (QID) | ORAL | Status: DC
  Administered 2020-01-23 – 2020-03-03 (×158): 10 mg
  Filled 2020-01-23 (×159): qty 10

## 2020-01-23 MED ORDER — FENTANYL CITRATE (PF) 100 MCG/2ML IJ SOLN
INTRAMUSCULAR | Status: AC
  Filled 2020-01-23: qty 2

## 2020-01-23 NOTE — Progress Notes (Signed)
Advanced Heart Failure Rounding Note   Subjective:    - 8/9 Cannulated for VV ECMO - 8/13 with several areas of intracranial hemorrhage. Bival stopped.  - 8/14 CT no change in [place]. Increased edema - 8/16 Extubated - 8/16 Head CT stable bleed  Extubated yesterday. Hypoxic overnight. Now improved. Will awaken and follow some commands.   On high-dose zosyn and vanc for enterococcus in blood and MSSA in sputum   CXR stable diffuse infiltrates No change  PLTs remain 31K -> 38k. Fibrinogen 145 (got cryo)  ECMO   Flow 5.1L RPM 4100 Sweep6.5 -> 4 -> 8.5  Labs:  7.42/59/54/88% on FiO2 100% TV 220 Hgb8.2 PLT 31 -> 38 LDH 608 (pre-cannulation) -> 558 -> 543 -> 501  -> 623 -> 1,110 -> 734 -> 585 -> 639 -> 494 PTT => off bival Lactic acid1.6-> 1.5 -> 2.0  Objective:   Weight Range:  Vital Signs:   Temp:  [95.4 F (35.2 C)-97.9 F (36.6 C)] 97.5 F (36.4 C) (08/17 0645) Pulse Rate:  [59-123] 63 (08/17 0645) Resp:  [0-43] 0 (08/17 0645) BP: (91-139)/(51-57) 127/55 (08/16 1941) SpO2:  [81 %-100 %] 97 % (08/17 0645) Arterial Line BP: (89-233)/(38-82) 98/44 (08/17 0645) FiO2 (%):  [100 %] 100 % (08/17 0400) Weight:  [131.8 kg] 131.8 kg (08/17 0500) Last BM Date: 01/22/20  Weight change: Filed Weights   01/21/20 0356 01/22/20 0335 01/23/20 0500  Weight: (!) 136.9 kg 135.9 kg 131.8 kg    Intake/Output:   Intake/Output Summary (Last 24 hours) at 01/23/2020 0759 Last data filed at 01/23/2020 0700 Gross per 24 hour  Intake 2941.43 ml  Output 2485 ml  Net 456.43 ml     Physical Exam: General:  Ill appearing. Sedated tachypneic HEENT: normal + scleral edema  Neck: supple.+ RIJ ECMO cannula Cor: PMI nondisplaced. Regular rate & rhythm. No rubs, gallops or murmurs. Lungs: tachypneic coarse Abdomen: obese soft, nontender, nondistended. No hepatosplenomegaly. No bruits or masses. Good bowel sounds. Extremities: no cyanosis, clubbing, rash, 2+  edema Neuro:  sedated    Telemetry: Sinus 60-70s Personally reviewed   Labs: Basic Metabolic Panel: Recent Labs  Lab 01/16/20 1100 01/16/20 1159 01/18/20 1700 01/18/20 1953 01/21/20 0355 01/21/20 0509 01/21/20 1522 01/21/20 1809 01/22/20 0235 01/22/20 0409 01/22/20 1600 01/22/20 1600 01/22/20 1940 01/22/20 2329 01/23/20 0207 01/23/20 0435 01/23/20 0436  NA 154*   < > 149*   < > 147*   < > 149*   < > 151*   < > 151*   < > 151* 149* 150* 149* 148*  K 5.7*   < > 5.4*   < > 4.1   < > 3.9   < > 4.0   < > 4.6   < > 4.2 4.9 4.0 4.1 4.1  CL 113*   < > 113*   < > 104  --  102  --  104  --  107  --   --   --   --   --  106  CO2 34*   < > 26   < > 28  --  32  --  33*  --  32  --   --   --   --   --  31  GLUCOSE 181*   < > 336*   < > 155*  --  160*  --  169*  --  181*  --   --   --   --   --  286*  BUN 84*   < > 108*   < > 87*  --  84*  --  83*  --  86*  --   --   --   --   --  97*  CREATININE 1.07*   < > 1.19*   < > 1.22*  --  1.16*  --  1.08*  --  1.04*  --   --   --   --   --  1.13*  CALCIUM 8.1*   < > 8.3*   < > 8.6*   < > 9.3   < > 9.0  --  9.1  --   --   --   --   --  8.9  MG 2.9*  --  2.7*  --   --   --   --   --   --   --   --   --   --   --   --   --  2.9*  PHOS 4.7*  --   --   --   --   --   --   --   --   --   --   --   --   --   --   --   --    < > = values in this interval not displayed.    Liver Function Tests: Recent Labs  Lab 01/19/20 0313 01/20/20 0347 01/21/20 0355 01/22/20 0235 01/23/20 0436  AST 147* 97* 93* 79* 49*  ALT 108* 104* 88* 62* 64*  ALKPHOS 214* 189* 167* 132* 133*  BILITOT 2.0* 1.7* 2.5* 2.2* 1.9*  PROT 4.4* 4.6* 5.1* 6.0* 5.5*  ALBUMIN 2.7* 2.5* 2.7* 4.5 3.9   No results for input(s): LIPASE, AMYLASE in the last 168 hours. No results for input(s): AMMONIA in the last 168 hours.  CBC: Recent Labs  Lab 01/21/20 0355 01/21/20 0509 01/21/20 1522 01/21/20 1809 01/22/20 0235 01/22/20 0409 01/22/20 0923 01/22/20 0932 01/22/20 1600 01/22/20 1600  01/22/20 1940 01/22/20 2329 01/23/20 0207 01/23/20 0435 01/23/20 0436  WBC 10.2  --  6.7  --  8.0  --   --   --  9.1  --   --   --   --   --  6.6  HGB 11.0*   < > 9.6*   < > 9.1*   < >  --    < > 9.2*   < > 8.5* 8.5* 8.5* 8.5* 8.2*  HCT 34.9*   < > 31.4*   < > 29.5*   < >  --    < > 31.1*   < > 25.0* 25.0* 25.0* 25.0* 27.5*  MCV 92.6  --  92.6  --  96.7  --   --   --  97.2  --   --   --   --   --  98.9  PLT 32*   < > 30*  --  31*  --  29*  --  37*  --   --   --   --   --  38*   < > = values in this interval not displayed.    Cardiac Enzymes: No results for input(s): CKTOTAL, CKMB, CKMBINDEX, TROPONINI in the last 168 hours.  BNP: BNP (last 3 results) No results for input(s): BNP in the last 8760 hours.  ProBNP (last 3 results) No results for input(s): PROBNP in the last 8760 hours.    Other results:  Imaging: CT Head Wo Contrast  Result Date: 01/22/2020 CLINICAL DATA:  Intracranial hemorrhage, follow-up EXAM: CT HEAD WITHOUT CONTRAST TECHNIQUE: Contiguous axial images were obtained from the base of the skull through the vertex without intravenous contrast. COMPARISON:  01/20/2020 FINDINGS: Brain: Multifocal parenchymal hemorrhage is not substantially changed. The largest area hemorrhage in the right parietal region is similar in size. Associated edema and regional mass effect are also similar. There is no new loss of gray-white differentiation. Ventricles are stable in size. No evidence of intraventricular extension of hemorrhage. Vascular: No new findings. Skull: Calvarium is unremarkable. Sinuses/Orbits: Nonspecific paranasal sinus mucosal thickening and opacification. No new orbital finding. Other: Nonspecific mastoid and middle ear effusions. IMPRESSION: Continued stability with no substantial change in multifocal parenchymal hemorrhages and associated mild regional mass effect. No new hemorrhage. Electronically Signed   By: Macy Mis M.D.   On: 01/22/2020 14:26   DG CHEST  PORT 1 VIEW  Result Date: 01/23/2020 CLINICAL DATA:  Respiratory failure.  COVID positive.  ECMO. EXAM: PORTABLE CHEST 1 VIEW COMPARISON:  01/23/2020. FINDINGS: Feeding tube, left IJ line, ECMO device in stable position. Complete opacification of both lungs again noted. Heart size cannot be evaluated. No pneumothorax. Prior cervical fusion. IMPRESSION: 1.  Lines and tubes including ECMO catheter stable position. 2. Complete opacification of both lungs again noted without interim change. Electronically Signed   By: Marcello Moores  Register   On: 01/23/2020 07:23   DG CHEST PORT 1 VIEW  Result Date: 01/23/2020 CLINICAL DATA:  Cardio respiratory failure EXAM: PORTABLE CHEST 1 VIEW COMPARISON:  Radiograph yesterday.  CT 01/19/2020 FINDINGS: The endotracheal tube is not visualized on the current exam. There is an enteric tube in place with tip below the diaphragm. Left internal jugular central venous catheter tip projects in the region of the upper SVC. ECMO catheter tip below the diaphragm not included in the field of view. There is a right upper extremity PICC tip in the SVC. Near complete opacification of both hemi thoraces with only small portion of aerated lung in the upper lobes. Near complete bilateral lung white out. Cardiomediastinal contours are obscured and not well visualized. IMPRESSION: 1. Progressive advanced bilateral airspace disease with diffuse bilateral lung opacity. 2. Endotracheal tube is not visualized on the current exam. Remaining support apparatus unchanged. Electronically Signed   By: Keith Rake M.D.   On: 01/23/2020 00:55   DG CHEST PORT 1 VIEW  Result Date: 01/22/2020 CLINICAL DATA:  On ECMO.  COVID-19 positive. EXAM: PORTABLE CHEST 1 VIEW COMPARISON:  1 day prior FINDINGS: Endotracheal tube terminates 5.0 cm above carina. Nasogastric tube extends beyond the inferior aspect of the film. ECMO catheter unchanged in position. Left internal jugular line tip at low SVC. Right-sided PICC  line is difficult to follow centrally. Diffuse white out of the chest, relatively similar with mild progression in the left upper lung. No pneumothorax. IMPRESSION: Minimally worsened left-sided aeration with complete whiteout of the chest. No pneumothorax or other acute complication. Electronically Signed   By: Abigail Miyamoto M.D.   On: 01/22/2020 08:02   DG CHEST PORT 1 VIEW  Result Date: 01/21/2020 CLINICAL DATA:  NG tube placement. EXAM: PORTABLE CHEST 1 VIEW COMPARISON:  01/21/2020 FINDINGS: Endotracheal tube with tip 4.2 cm above the carina. Right-sided PICC line has tip over the region of the SVC. Left IJ central venous catheter has tip over the SVC. Interval placement of enteric tube which courses into the region of the stomach and off the film  as tip is not visualized. ECMO apparatus unchanged. Lungs are adequately inflated demonstrate persistent severe airspace consolidation bilaterally with air bronchograms and slightly improved aeration over the left apex. Findings likely due to combination of infection and ARDS. Possible associated bilateral effusions/bibasilar atelectasis. Remainder of the exam is unchanged. IMPRESSION: 1. Persistent severe bilateral airspace process with air bronchograms with slightly improved aeration over the left apex. Findings likely due to combination of infection and ARDS. Possible associated bilateral effusions/bibasilar atelectasis. 2. Tubes and lines as described. Electronically Signed   By: Marin Olp M.D.   On: 01/21/2020 13:51     Medications:     Scheduled Medications: . sodium chloride   Intravenous Once  . artificial tears  1 application Both Eyes O5D  . vitamin C  500 mg Per Tube Daily  . chlorhexidine gluconate (MEDLINE KIT)  15 mL Mouth Rinse BID  . Chlorhexidine Gluconate Cloth  6 each Topical Daily  . clonazePAM  1 mg Per Tube TID  . docusate  100 mg Per Tube BID  . etomidate  40 mg Intravenous Once  . feeding supplement (PROSource TF)  45 mL  Per Tube BID  . fentaNYL      . fentaNYL (SUBLIMAZE) injection  200 mcg Intravenous Once  . fentaNYL (SUBLIMAZE) injection  50 mcg Intravenous Once  . linagliptin  5 mg Per Tube Daily  . mouth rinse  15 mL Mouth Rinse 10 times per day  . methylPREDNISolone (SOLU-MEDROL) injection  20 mg Intravenous Daily  . midazolam      . midazolam  5 mg Intravenous Once  . nutrition supplement (JUVEN)  1 packet Per Tube BID BM  . oxyCODONE  5 mg Per Tube Q6H  . pantoprazole sodium  40 mg Per Tube Daily  . polyethylene glycol  17 g Per Tube Daily  . QUEtiapine  50 mg Per Tube QHS  . sennosides  5 mL Per Tube BID  . sodium chloride flush  10-40 mL Intracatheter Q12H  . vecuronium  10 mg Intravenous Once  . zinc sulfate  220 mg Per Tube Daily    Infusions: . sodium chloride 250 mL (01/21/20 1227)  . sodium chloride Stopped (01/28/2020 1009)  . albumin human 12.5 g (01/21/20 2125)  . dexmedetomidine (PRECEDEX) IV infusion 1.2 mcg/kg/hr (01/23/20 0700)  . feeding supplement (PIVOT 1.5 CAL) 1,000 mL (01/22/20 1955)  . fentaNYL infusion INTRAVENOUS 100 mcg/hr (01/23/20 0700)  . insulin 16 mL/hr at 01/23/20 0700  . labetalol (NORMODYNE) infusion 5 mg/mL Stopped (01/23/20 6644)  . norepinephrine (LEVOPHED) Adult infusion 5 mcg/min (01/23/20 0700)  . piperacillin-tazobactam (ZOSYN)  IV Stopped (01/23/20 0645)    PRN Medications: sodium chloride, acetaminophen (TYLENOL) oral liquid 160 mg/5 mL, albumin human, albuterol, dextrose, fentaNYL, fentaNYL, guaiFENesin-dextromethorphan, hydrALAZINE, hydrOXYzine, ondansetron **OR** ondansetron (ZOFRAN) IV, sodium chloride flush   Assessment/Plan:   1. Acute hypoxic/hypercapneic respiratory failure in setting of severe COVID PNA/ARDS -> VV ECMO - admit 8/2 - intubation 8/3 - has received actmera (compelted 8/2), remdesivir (completed 8/6) and steroids - failed full vent support with proning/paralytic - Cannulated for VV ECMO on 8/9 - Extubated 8/16 -  agitated and hypoxic overnight but now improved. - CXR with diffuse infiltrates  - ECMO circuit ok   - Off bival due to [place]. Repeat head CT yesterday. Stable.  - Neuro has seen - can consider restarting low-dose AC in near future - Continue abx.  - Will continue full support - Likely will need trach prior to  ECMO decannulation if she gets that far - Adjust sedation   2. Enterococcus sepsis - continue vanc + high dose zosyn per pharmacy   3. Intracranial hemorrhage - ? Septic emboli - repeat head CT on 8/14 with stable bleeds but increased edema - neurology following. Suspect significant long-term injury sustained. Will follow commands. Appears to have dense LUE weakness and possibly LLE - repeat head CT stable 8/16 - No AC with ICH for now - Will need TEE when platelets up   4. Thrombocytopenia - suspect due to sepsis.  - no obvious bleeding - would not transfuse PLTs unless absolutely necessary given risk of clotting pump  4. Morbid obesity - Body mass index is 49.38 kg/m.  5. Poorly controlled DM2 - HgBA1c 10.7 - CBGs remain elevated - On IV insulin adjust as needed  4. Hypernatremia - improved. Stop free water boluses given ICH and benefit of permissive hypernatremia  5. Lactic acidosis -worse today. Follow trend.keep BP and sats up    CRITICAL CARE Performed by: Glori Bickers  Total critical care time: 35 minutes  Critical care time was exclusive of separately billable procedures and treating other patients.  Critical care was necessary to treat or prevent imminent or life-threatening deterioration.  Critical care was time spent personally by me (independent of midlevel providers or residents) on the following activities: development of treatment plan with patient and/or surrogate as well as nursing, discussions with consultants, evaluation of patient's response to treatment, examination of patient, obtaining history from patient or surrogate, ordering and  performing treatments and interventions, ordering and review of laboratory studies, ordering and review of radiographic studies, pulse oximetry and re-evaluation of patient's condition.   Length of Stay: 15   Glori Bickers  MD 01/23/2020, 7:59 AM  Advanced Heart Failure Team Pager [phone] (M-F; [place])  Please contact [facility] for night-coverage after hours (4p -7a ) and weekends on [email or web address]

## 2020-01-23 NOTE — Evaluation (Signed)
Occupational Therapy Evaluation Patient Details Name: Alisha Stephenson MRN: [id number] DOB: 08/21/1965 Today's Date: 01/23/2020    History of Present Illness 54 yo admitted 8/2 with hypoxemia due to Covid 19 PNA. 8/3 intubated. 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  PMHx: DM, diverticulitis, Asthma   Clinical Impression   This 54 y/o female presents with the above. Per family report pt very independent with ADL and functional mobility PTA. Pt currently on VV ECMO, intubated yesterday and on heated HFNC/NRB. Pt lethargic today, nodding head x1 and minimally moving R foot x1 but otherwise with no command follow/response to voice or noxious stimuli. Pt tolerating gentle PROM to bil UE/LEs within available limits given ecmo lines with VSS (SpO2 >/=94%, HR 70s). Pt to benefit from continued acute OT services to continue progressing her overall strength and endurance as precursor to ADL/mobility. Spoke with RN and plans for transfer to kreg tilt bed to initiate wt bearing through bil LEs. Will continue to follow.     Follow Up Recommendations  Other (comment) (TBD)    Equipment Recommendations  Other (comment) (TBD)           Precautions / Restrictions Precautions Precautions: Other (comment) Precaution Comments: ECMO with anchors at right shoulder and chest Restrictions Weight Bearing Restrictions: No      Mobility Bed Mobility               General bed mobility comments: unable to mobilize at this time due to lethargy and ECMO                            ADL either performed or assessed with clinical judgement   ADL Overall ADL's : Needs assistance/impaired                                       General ADL Comments: currently totalA                         Pertinent Vitals/Pain Pain Assessment: No/denies pain     Hand Dominance     Extremity/Trunk Assessment Upper Extremity  Assessment Upper Extremity Assessment: Generalized weakness;Difficult to assess due to impaired cognition;RUE deficits/detail;LUE deficits/detail RUE Deficits / Details: edemous bil UE; unable to assess full due to lethargy RUE Coordination: decreased fine motor;decreased gross motor LUE Deficits / Details: edemous bil UE; unable to assess full due to lethargy LUE Coordination: decreased fine motor;decreased gross motor   Lower Extremity Assessment Lower Extremity Assessment: Defer to PT evaluation RLE Deficits / Details: weak and brief ankle movement with no other AROM, PROM for range available grossly WFL LLE Deficits / Details: no AROM, PROM grossly [facility]       Communication Communication Communication: Other (comment) (lethargic/NRB/tenuous O2)   Cognition Arousal/Alertness: Lethargic;Suspect due to medications Behavior During Therapy: Flat affect Overall Cognitive Status: Difficult to assess                                 General Comments: pt lethargic throughout and only brief head not and Rt foot wiggle despite noxious stimuli and repeated attempts at arousal   General Comments       Exercises Exercises: Hand exercises;Other exercises;General Upper Extremity General Exercises - Upper Extremity Shoulder Flexion:  PROM;Left;10 reps;Supine Shoulder Horizontal ABduction: PROM;Left;10 reps;Supine Shoulder Horizontal ADduction: PROM;Left;10 reps;Supine Elbow Flexion: PROM;Both;10 reps;Supine Elbow Extension: PROM;Both;10 reps Wrist Flexion: PROM;Left;10 reps;Supine Wrist Extension: PROM;Left;10 reps;Supine Digit Composite Flexion: PROM;Both;10 reps Composite Extension: PROM;Both;10 reps General Exercises - Lower Extremity Ankle Circles/Pumps: AROM;Both;Supine;15 reps Hip ABduction/ADduction: PROM;Both;15 reps;Supine Hip Flexion/Marching: PROM;15 reps;Supine Hand Exercises Forearm Supination: PROM;5 reps;Both Other Exercises Other Exercises: bil LE  internal/external hip rotation x 15 reps PROM Other Exercises: gentle retrograde massage to bil hands   Shoulder Instructions      Home Living Family/patient expects to be discharged to:: Private residence Living Arrangements: Spouse/significant other Available Help at Discharge: Family;Available 24 hours/day Type of Home: Mobile home Home Access: Stairs to enter Entrance Stairs-Number of Steps: 4   Home Layout: One level     Bathroom Shower/Tub: Teacher, early years/pre: Standard     Home Equipment: None   Additional Comments: trying to change to walk in shower      Prior Functioning/Environment Level of Independence: Independent        Comments: works as a Engineer, production Problem List: Decreased strength;Decreased range of motion;Decreased activity tolerance;Impaired balance (sitting and/or standing);Decreased cognition;Decreased knowledge of use of DME or AE;Cardiopulmonary status limiting activity;Decreased knowledge of precautions;Obesity;Impaired UE functional use;Impaired tone;Increased edema      OT Treatment/Interventions: Self-care/ADL training;Therapeutic exercise;Energy conservation;DME and/or AE instruction;Therapeutic activities;Cognitive remediation/compensation;Patient/family education;Balance training    OT Goals(Current goals can be found in the care plan section) Acute Rehab OT Goals Patient Stated Goal: return to walking and moving OT Goal Formulation: With patient/family Time For Goal Achievement: 02/06/20 Potential to Achieve Goals: Good  OT Frequency: Min 2X/week   Barriers to D/C:            Co-evaluation PT/OT/SLP Co-Evaluation/Treatment: Yes Reason for Co-Treatment: For patient/therapist safety PT goals addressed during session: Mobility/safety with mobility;Strengthening/ROM OT goals addressed during session: Strengthening/ROM      AM-PAC OT "6 Clicks" Daily Activity     Outcome Measure Help  from another person eating meals?: Total Help from another person taking care of personal grooming?: Total Help from another person toileting, which includes using toliet, bedpan, or urinal?: Total Help from another person bathing (including washing, rinsing, drying)?: Total Help from another person to put on and taking off regular upper body clothing?: Total Help from another person to put on and taking off regular lower body clothing?: Total 6 Click Score: 6   End of Session Equipment Utilized During Treatment: Oxygen Nurse Communication: Mobility status  Activity Tolerance: Treatment limited secondary to medical complications (Comment);Patient limited by lethargy Patient left: in bed;with call bell/phone within reach  OT Visit Diagnosis: Other abnormalities of gait and mobility (R26.89);Muscle weakness (generalized) (M62.81);Other symptoms and signs involving cognitive function                Time: 0086-7619 OT Time Calculation (min): 22 min Charges:  OT General Charges $OT Visit: 1 Visit OT Evaluation $OT Eval High Complexity: 1 High  Lou Cal, OT Acute Rehabilitation Services Pager [phone] Office [phone]   Raymondo Band 01/23/2020, 1:58 PM

## 2020-01-23 NOTE — CV Procedure (Signed)
ECMO NOTE:  Indication: Respiratory failure due to COVID PNA  Initial cannulation date: 01/19/2020  ECMO type: VV ECMO (Centrimag with oximizer)  Dual lumen Inflow/return cannula: 32 FR RIJ [place] placed 02/04/2020  ECMO events:  - Initial cannulation 02/02/2020 - Cannula repositioned (pulled back ) 01/17/20 - Off bival 8/13 due to [place]  Daily data:  Flow5.2 RPM 4100 Sweep8.5  Labs:  ABG good, pAO2 54 Sodium remains on higher side (goal 145-155 given cerebral edema) Sugars up Cr stable BUN 86>>97 LDH 639>>494 Fibrinogen < 60 >> 1 unit cryo >> 145 INR 2.0>>1.5 PTT 45>>34 HgB 8.5>>8.2 Plts 37>>38 WBC 9/1>>6.6   Assessment: -Severe COVID ARDS s/p VA ECMO -Enterococcal bacteremia -MSSA pneumonia -Thrombocytopenia- some combination of hemolysis, bone marrow stunning, and sepsis effect - Muscular deconditioning  Plan: -Continue VV ECMO support -Hold on intubation and trach for now -Hold on [facility] for now, good flows, LDH downtrending, circuit clots stable on exam -Work on sedation wean -Zosyn to end 8/20 -AC and diuresis per discussion with ECMO and CHF teams -PT & OT consults.  Erskine Emery MD PCCM

## 2020-01-23 NOTE — Evaluation (Signed)
Physical Therapy Evaluation Patient Details Name: Alisha Stephenson MRN: [id number] DOB: 01-15-1966 Today's Date: 01/23/2020   History of Present Illness  54 yo admitted 8/2 with hypoxemia due to Covid 19 PNA. 8/3 intubated. 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  PMHx: DM, diverticulitis, Asthma  Clinical Impression  Pt remains on ECMO at time of eval on HFNC with NRB to maintain SpO2 99%, HR 75, BP 140/50. Pt currently unable to respond to commands significantly with very minimal head nod and right ankle wiggle during session. No response to nail bed pressure all extremities. Per nursing report pt has been moving head forcefully with anxiety and able to squeeze right hand and wiggle right foot but not witnessed during this session. Called spouse to obtain PLOF and home setup. Pt seen with OT and ECMO specialist with PROM available extremities performed. Pt with decreased cognition, strength, mobility and pulmonary function who will benefit from acute therapy to maximize mobility and function to decrease burden of care.      Follow Up Recommendations Other (comment) (TBD with progression)    Equipment Recommendations  Other (comment) (TBD)    Recommendations for Other Services       Precautions / Restrictions Precautions Precautions: Other (comment) Precaution Comments: ECMO with anchors at right shoulder and chest Restrictions Weight Bearing Restrictions: No      Mobility  Bed Mobility               General bed mobility comments: unable to mobilize at this time due to lethargy and ECMO  Transfers                    Ambulation/Gait                Stairs            Wheelchair Mobility    Modified Rankin (Stroke Patients Only)       Balance                                             Pertinent Vitals/Pain Pain Assessment: No/denies pain    Home Living Family/patient  expects to be discharged to:: Private residence Living Arrangements: Spouse/significant other Available Help at Discharge: Family;Available 24 hours/day Type of Home: Mobile home Home Access: Stairs to enter   Entrance Stairs-Number of Steps: 4 Home Layout: One level Home Equipment: None Additional Comments: trying to change to walk in shower    Prior Function Level of Independence: Independent         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        Extremity/Trunk Assessment   Upper Extremity Assessment Upper Extremity Assessment: Difficult to assess due to impaired cognition    Lower Extremity Assessment Lower Extremity Assessment: Difficult to assess due to impaired cognition;RLE deficits/detail;LLE deficits/detail RLE Deficits / Details: weak and brief ankle movement with no other AROM, PROM for range available grossly WFL LLE Deficits / Details: no AROM, PROM grossly [facility]       Communication      Cognition Arousal/Alertness: Lethargic;Suspect due to medications Behavior During Therapy: Flat affect  General Comments: pt lethargic throughout and only brief head not and Rt foot wiggle despite noxious stimuli and repeated attempts at arousal      General Comments      Exercises General Exercises - Lower Extremity Ankle Circles/Pumps: AROM;Both;Supine;15 reps Hip ABduction/ADduction: PROM;Both;15 reps;Supine Hip Flexion/Marching: PROM;15 reps;Supine Other Exercises Other Exercises: bil LE internal/external hip rotation x 15 reps PROM   Assessment/Plan    PT Assessment Patient needs continued PT services  PT Problem List Decreased mobility;Decreased strength;Decreased activity tolerance;Decreased cognition;Decreased balance;Decreased range of motion;Cardiopulmonary status limiting activity       PT Treatment Interventions Therapeutic exercise;Balance training;Functional  mobility training;Cognitive remediation;Therapeutic activities;Patient/family education    PT Goals (Current goals can be found in the Care Plan section)  Acute Rehab PT Goals Patient Stated Goal: return to walking and moving PT Goal Formulation: With family Time For Goal Achievement: 02/06/20 Potential to Achieve Goals: Fair    Frequency Min 2X/week   Barriers to discharge        Co-evaluation PT/OT/SLP Co-Evaluation/Treatment: Yes Reason for Co-Treatment: Complexity of the patient's impairments (multi-system involvement);For patient/therapist safety PT goals addressed during session: Mobility/safety with mobility;Strengthening/ROM         AM-PAC PT "6 Clicks" Mobility  Outcome Measure Help needed turning from your back to your side while in a flat bed without using bedrails?: Total Help needed moving from lying on your back to sitting on the side of a flat bed without using bedrails?: Total Help needed moving to and from a bed to a chair (including a wheelchair)?: Total Help needed standing up from a chair using your arms (e.g., wheelchair or bedside chair)?: Total Help needed to walk in hospital room?: Total Help needed climbing 3-5 steps with a railing? : Total 6 Click Score: 6    End of Session   Activity Tolerance: Patient tolerated treatment well Patient left: in bed;with nursing/sitter in room Nurse Communication: Mobility status;Need for lift equipment PT Visit Diagnosis: Other abnormalities of gait and mobility (R26.89);Muscle weakness (generalized) (M62.81)    Time: 6712-4580 PT Time Calculation (min) (ACUTE ONLY): 22 min   Charges:   PT Evaluation $PT Eval High Complexity: 1 High          Ashlei Chinchilla P, PT Acute Rehabilitation Services Pager: [phone] Office: [phone]   Sandy Salaam Lovenia Debruler 01/23/2020, 10:48 AM

## 2020-01-23 NOTE — Progress Notes (Signed)
PCCM Interval Note  Called to bedside for desaturations to low 80s. Patient was extubated prior to night shift. Sedation was titrated for patient comfort however began having apneic episodes associated with desaturation. After lightening sedation, patient still remained hypoxemic in the low 80s. CXR with nearly complete opacification bilaterally with PICC and ECMO catheter in place. Initially planned to reintubate however when patient was repositioned, saturations improved. Suspect cannula position may have been optimized. Placed patient in reverse trendelenburg.  Assessment Malpositioned cannula - improved on reverse trendelenburg  CC Time 45 min Rodman Pickle, M.D. [facility] Pulmonary/Critical Care Medicine 01/23/2020 1:32 AM

## 2020-01-23 NOTE — Progress Notes (Signed)
Assisted tele visit to patient with family member.  Mika Anastasi McEachran, RN  

## 2020-01-23 NOTE — Progress Notes (Signed)
66mL fentanyl wasted with Cyd Silence, RN in stericycle container d/t expiring tubing.

## 2020-01-23 NOTE — Progress Notes (Signed)
Nutrition Follow Up  DOCUMENTATION CODES:   Morbid obesity  INTERVENTION:   Continue tube feeding:  -Pivot 1.5 @ 75 ml/hr (1800 ml) via NG -45 ml Prosource BID  Provides: 2780 kcals, 191 grams protein, 1350 ml free water. Meets 100% needs.   Continue Juven Fruit Punch BID, each serving provides 95kcal and 2.5g of protein (amino acids glutamine and arginine)   NUTRITION DIAGNOSIS:   Increased nutrient needs related to acute illness (COVID-19 infection) as evidenced by estimated needs.  Ongoing  GOAL:   Patient will meet greater than or equal to 90% of their needs  Addressed via TF  MONITOR:   Vent status, TF tolerance, Labs, Weight trends  REASON FOR ASSESSMENT:   Ventilator, Consult Enteral/tube feeding initiation and management  ASSESSMENT:   54 y.o. female with medical history of type 2 DM. She presented to the ED at [facility] with SOB diarrhea x1 week, and cough. She was dx with COVID-19 one week prior to presentation to the ED.   8/9- tx from [facility], s/p VV ECMO cannulation 8/13- CT head with multiple areas of [place] 8/16- extubated, gastric Cortrak placed   Encephalopathic and delirious. On 30L HFNC. Off pressors. Holding off on further diuresis today. Free water flushes d/c. Na increased to 151 today- goal now 145-155 given cerebral edema. Tolerating tube feeding at goal via Cortrak. CBGs better controlled with insulin drip.   Admission weight: 133.6 kg  Current weight: 131.8 kg   I/O: +4,540 ml since 8/3 UOP: 2,485 ml x 24 hrs  Drips: precedex, insulin  Medications: 500 mg Vitamin C, colace, tradjenta, solumedrol, miralax, senokot, zinc sulfate  Labs: Na 151 Mg 2.9 (H) CBG 155-286 BUN 97- trending up  Diet Order:   Diet Order            Diet NPO time specified  Diet effective midnight                 EDUCATION NEEDS:   No education needs have been identified at this time  Skin:  Skin Assessment: Skin Integrity Issues: Skin Integrity  Issues:: Other (Comment) Other: MASD-breast skin tears-face/chest/chin  Last BM:  8/16  Height:   Ht Readings from Last 1 Encounters:  01/11/2020 5\' 4"  (1.626 m)    Weight:   Wt Readings from Last 1 Encounters:  01/23/20 131.8 kg    Ideal Body Weight:  54.5 kg Adj Body Weight: 84.7 kg   BMI:  Body mass index is 49.88 kg/m.  Estimated Nutritional Needs:   Kcal:  2836-6294 kcal  Protein:  160-195 grams  Fluid:  >/= 2 L/day  Mariana Single RD, LDN Clinical Nutrition Pager listed in [place]

## 2020-01-23 NOTE — Progress Notes (Addendum)
NAME:  Alisha Stephenson, MRN:  [id number], DOB:  04-30-1966, LOS: 50 ADMISSION DATE:  01/14/2020, CONSULTATION DATE:  8/3 REFERRING MD:  Alfredia Ferguson, CHIEF COMPLAINT:  Dyspnea   Brief History   54 y/o female admitted on 8/2 with severe acute respiratory failure with hypoxemia due to COVID 19 pneumonia.  She developed symptoms 1 week prior to admission.  Past Medical History  DM2 Diverticulitis Gallstones Ovarian cyst NAFLD Asthma  Significant Hospital Events   8/2 admit 8/3 ICU transfer, intubated 8/4 prone, paralyze 8/9 significant desaturations today 8/9 VV ECMO cannulation 8/13 head bleed  Consults:  PCCM ECMO team    Procedures:  8/3 ETT >  8/3 PICC >  8/9 LIJ MML 8/9 RIJ [place] 13F   Significant Diagnostic Tests:  7/31 CT head > NAICP 7/31 MRI/MRA brain > no acute changes, possibly small aneurysm ACOM 8/13 CT head> multiple areas of ICH  Micro Data:  8/2 blood > NG 8/2 SARS COV 2 > positive 8/4 resp > negative 8/4 urine >  8/12 blood > E. faecalis (pan-sensitive) 8/12 resp: staph aureus> MSSA  Antimicrobials:  8/2 remdesivir > 8/6 8/2 actemra  8/2 solumedrol >   8/3 ceftriaxone >  8/5 8/3 azithro >  8/5  8/12 meropenem>8/16 8/12 vanc>8/16  8/13 zosyn >> 8/20 (planned)  Interim history/subjective:  Extubated. Encephalopathic and delirious with associated hypertension, improved with sedation.  Objective   Blood pressure (!) 127/55, pulse 63, temperature (!) 97.5 F (36.4 C), resp. rate (!) 0, height 5\' 4"  (1.626 m), weight 131.8 kg, SpO2 97 %. CVP:  [6 mmHg-29 mmHg] 12 mmHg  Vent Mode: PCV FiO2 (%):  [100 %] 100 % Set Rate:  [14 bmp] 14 bmp PEEP:  [14 cmH20] 14 cmH20 Plateau Pressure:  [26 cmH20] 26 cmH20   Intake/Output Summary (Last 24 hours) at 01/23/2020 8850 Last data filed at 01/23/2020 0700 Gross per 24 hour  Intake 2808.55 ml  Output 2425 ml  Net 383.55 ml   Filed Weights   01/21/20 0356 01/22/20 0335 01/23/20 0500  Weight: (!)  136.9 kg 135.9 kg 131.8 kg    Examination:  GEN: obtunded woman laying in bed HEENT: RIJ ecmo cannula CDI, minimal oozing CV: RRR, ext warm PULM: intermittent apnea, extremely poor air movement when breaths GI: Soft, +BS EXT: trace global anasarca NEURO: heavily sedated but winces to pain PSYCH: RASS -5 SKIN: better color than yesterday  Labs reviewed CXR whiteout bilaterally   Resolved Hospital Problem list     Assessment & Plan:  Acute hypoxic and AoC hypercapneic respiratory failure; likely underly OHS (baseline bicarb 27-30) ARDS due to COVID 19 pneumonia   VV ECMO cannulation on 8/9 for refractory hypoxemia MSSA pneumonia Enterococcal bacteremia AKI; severe azotemia Elevated LDH, hypo-fibrinogenemia and thrombocytopenia; most likely due to sepsis, ?liver dx likely. Hb stable and low platelets but no schistocytes on smear suggest DIC is not cause.   Improved oozing and labs with cryo x 1 8/16. ICH, multifocal. L-sided weakness.  Encephalopathy- ICU delirium + ischemic injury + azotemia -Diuresis discussed daily with CHF team: leave alone today - Lighten sedation a bit today to see if we can find happy medium between apnea and delirium - Given ongoing issues with coagulopathy, thrombocytopenia and lack of clear indication, I am not inclined to put her back on vent at this time, she is saturating 100% with good ABG while apneic -Appreciate Dr. Clydene Fake input; okay to start low dose AC if we need to; going to  hold off given ongoing mucosal bleeding, ongoing coagulopathy creating unclear monitoring for AC level, stable LDH, and lack of issues in circuit -PT and OT consults  Shock related to sedation- norepinephrine PRN  Hyperglycemia > not controlled at all with [place] insulin despite aggressive upward titration.  Assume poor absorption, potentially due to subcutaneous edema.    -Remains on insulin infusion -Goal BG 140-180 while admitted to the ICU -Continue linagliptin to  decrease insulin requirements  Constipation, resolved  Hypernatremia  -discontinue free water repletion- will tolerate hypernatremia for now given concern for intracranial hypertension -con't to monitor -avoid NS due to chloride content  Acute anemia due to critical illness -transfuse for Hb <8 -con't to monitor  Best practice:  Diet: tube feeding Pain/Anxiety/Delirium protocol (if indicated): as above VAP protocol (if indicated): yes DVT prophylaxis: SCDs while bivalirudin off GI prophylaxis: famotidine Glucose control:  insulin infusion Mobility: bed rest Code Status: full Family Communication: called [name] 8/17 Disposition: ICU  The patient is critically ill with multiple organ systems failure and requires high complexity decision making for assessment and support, frequent evaluation and titration of therapies, application of advanced monitoring technologies and extensive interpretation of multiple databases. Critical Care Time devoted to patient care services described in this note independent of APP/resident time (if applicable)  is 34 minutes.   Erskine Emery MD [facility] 01/23/2020 8:28 AM Personal pager: [phone] If unanswered, please page CCM On-call: [phone]

## 2020-01-24 ENCOUNTER — Inpatient Hospital Stay: Payer: Self-pay

## 2020-01-24 ENCOUNTER — Inpatient Hospital Stay (HOSPITAL_COMMUNITY): Payer: PRIVATE HEALTH INSURANCE

## 2020-01-24 DIAGNOSIS — J80 Acute respiratory distress syndrome: Secondary | ICD-10-CM

## 2020-01-24 DIAGNOSIS — J1282 Pneumonia due to Coronavirus disease 2019: Secondary | ICD-10-CM | POA: Diagnosis not present

## 2020-01-24 DIAGNOSIS — U071 COVID-19: Secondary | ICD-10-CM

## 2020-01-24 LAB — POCT I-STAT 7, (LYTES, BLD GAS, ICA,H+H)
Acid-Base Excess: 12 mmol/L — ABNORMAL HIGH (ref 0.0–2.0)
Acid-Base Excess: 8 mmol/L — ABNORMAL HIGH (ref 0.0–2.0)
Acid-Base Excess: 8 mmol/L — ABNORMAL HIGH (ref 0.0–2.0)
Bicarbonate: 32.8 mmol/L — ABNORMAL HIGH (ref 20.0–28.0)
Bicarbonate: 33.3 mmol/L — ABNORMAL HIGH (ref 20.0–28.0)
Bicarbonate: 36.1 mmol/L — ABNORMAL HIGH (ref 20.0–28.0)
Calcium, Ion: 1.18 mmol/L (ref 1.15–1.40)
Calcium, Ion: 1.21 mmol/L (ref 1.15–1.40)
Calcium, Ion: 1.23 mmol/L (ref 1.15–1.40)
HCT: 23 % — ABNORMAL LOW (ref 36.0–46.0)
HCT: 23 % — ABNORMAL LOW (ref 36.0–46.0)
HCT: 24 % — ABNORMAL LOW (ref 36.0–46.0)
Hemoglobin: 7.8 g/dL — ABNORMAL LOW (ref 12.0–15.0)
Hemoglobin: 7.8 g/dL — ABNORMAL LOW (ref 12.0–15.0)
Hemoglobin: 8.2 g/dL — ABNORMAL LOW (ref 12.0–15.0)
O2 Saturation: 91 %
O2 Saturation: 95 %
O2 Saturation: 99 %
Patient temperature: 35.2
Patient temperature: 35.6
Patient temperature: 35.7
Potassium: 3.6 mmol/L (ref 3.5–5.1)
Potassium: 4.4 mmol/L (ref 3.5–5.1)
Potassium: 5 mmol/L (ref 3.5–5.1)
Sodium: 150 mmol/L — ABNORMAL HIGH (ref 135–145)
Sodium: 151 mmol/L — ABNORMAL HIGH (ref 135–145)
Sodium: 153 mmol/L — ABNORMAL HIGH (ref 135–145)
TCO2: 34 mmol/L — ABNORMAL HIGH (ref 22–32)
TCO2: 35 mmol/L — ABNORMAL HIGH (ref 22–32)
TCO2: 38 mmol/L — ABNORMAL HIGH (ref 22–32)
pCO2 arterial: 44.5 mmHg (ref 32.0–48.0)
pCO2 arterial: 44.7 mmHg (ref 32.0–48.0)
pCO2 arterial: 49.9 mmHg — ABNORMAL HIGH (ref 32.0–48.0)
pH, Arterial: 7.426 (ref 7.350–7.450)
pH, Arterial: 7.469 — ABNORMAL HIGH (ref 7.350–7.450)
pH, Arterial: 7.511 — ABNORMAL HIGH (ref 7.350–7.450)
pO2, Arterial: 132 mmHg — ABNORMAL HIGH (ref 83.0–108.0)
pO2, Arterial: 56 mmHg — ABNORMAL LOW (ref 83.0–108.0)
pO2, Arterial: 64 mmHg — ABNORMAL LOW (ref 83.0–108.0)

## 2020-01-24 LAB — GLUCOSE, CAPILLARY
Glucose-Capillary: 144 mg/dL — ABNORMAL HIGH (ref 70–99)
Glucose-Capillary: 197 mg/dL — ABNORMAL HIGH (ref 70–99)
Glucose-Capillary: 219 mg/dL — ABNORMAL HIGH (ref 70–99)
Glucose-Capillary: 235 mg/dL — ABNORMAL HIGH (ref 70–99)
Glucose-Capillary: 266 mg/dL — ABNORMAL HIGH (ref 70–99)
Glucose-Capillary: 268 mg/dL — ABNORMAL HIGH (ref 70–99)
Glucose-Capillary: 274 mg/dL — ABNORMAL HIGH (ref 70–99)
Glucose-Capillary: 281 mg/dL — ABNORMAL HIGH (ref 70–99)

## 2020-01-24 LAB — PROTIME-INR
INR: 1.7 — ABNORMAL HIGH (ref 0.8–1.2)
Prothrombin Time: 18.9 seconds — ABNORMAL HIGH (ref 11.4–15.2)

## 2020-01-24 LAB — BASIC METABOLIC PANEL
Anion gap: 9 (ref 5–15)
Anion gap: 12 (ref 5–15)
BUN: 118 mg/dL — ABNORMAL HIGH (ref 6–20)
BUN: 116 mg/dL — ABNORMAL HIGH (ref 6–20)
CO2: 31 mmol/L (ref 22–32)
CO2: 29 mmol/L (ref 22–32)
Calcium: 9 mg/dL (ref 8.9–10.3)
Calcium: 9.2 mg/dL (ref 8.9–10.3)
Chloride: 109 mmol/L (ref 98–111)
Chloride: 113 mmol/L — ABNORMAL HIGH (ref 98–111)
Creatinine, Ser: 1.08 mg/dL — ABNORMAL HIGH (ref 0.44–1.00)
Creatinine, Ser: 1.06 mg/dL — ABNORMAL HIGH (ref 0.44–1.00)
GFR calc Af Amer: >60 mL/min (ref >60)
GFR calc Af Amer: >60 mL/min (ref >60)
GFR calc non Af Amer: 58 mL/min — ABNORMAL LOW (ref >60)
GFR calc non Af Amer: 59 mL/min — ABNORMAL LOW (ref >60)
Glucose, Bld: 294 mg/dL — ABNORMAL HIGH (ref 70–99)
Glucose, Bld: 256 mg/dL — ABNORMAL HIGH (ref 70–99)
Potassium: 3.8 mmol/L (ref 3.5–5.1)
Potassium: 5.2 mmol/L — ABNORMAL HIGH (ref 3.5–5.1)
Sodium: 149 mmol/L — ABNORMAL HIGH (ref 135–145)
Sodium: 154 mmol/L — ABNORMAL HIGH (ref 135–145)

## 2020-01-24 LAB — BPAM PLATELET PHERESIS
Blood Product Expiration Date: 202108172359
ISSUE DATE / TIME: 202108172121
Unit Type and Rh: 6200

## 2020-01-24 LAB — CBC
HCT: 28 % — ABNORMAL LOW (ref 36.0–46.0)
HCT: 30.6 % — ABNORMAL LOW (ref 36.0–46.0)
Hemoglobin: 8.3 g/dL — ABNORMAL LOW (ref 12.0–15.0)
Hemoglobin: 9 g/dL — ABNORMAL LOW (ref 12.0–15.0)
MCH: 30.2 pg (ref 26.0–34.0)
MCH: 30 pg (ref 26.0–34.0)
MCHC: 29.6 g/dL — ABNORMAL LOW (ref 30.0–36.0)
MCHC: 29.4 g/dL — ABNORMAL LOW (ref 30.0–36.0)
MCV: 101.8 fL — ABNORMAL HIGH (ref 80.0–100.0)
MCV: 102 fL — ABNORMAL HIGH (ref 80.0–100.0)
Platelets: 42 10*3/uL — ABNORMAL LOW (ref 150–400)
Platelets: 43 10*3/uL — ABNORMAL LOW (ref 150–400)
RBC: 2.75 MIL/uL — ABNORMAL LOW (ref 3.87–5.11)
RBC: 3 MIL/uL — ABNORMAL LOW (ref 3.87–5.11)
RDW: 19 % — ABNORMAL HIGH (ref 11.5–15.5)
RDW: 19.2 % — ABNORMAL HIGH (ref 11.5–15.5)
WBC: 7.8 10*3/uL (ref 4.0–10.5)
WBC: 10 10*3/uL (ref 4.0–10.5)
nRBC: 2.2 % — ABNORMAL HIGH (ref 0.0–0.2)
nRBC: 2.3 % — ABNORMAL HIGH (ref 0.0–0.2)

## 2020-01-24 LAB — PREPARE PLATELET PHERESIS: Unit division: 0

## 2020-01-24 LAB — LACTATE DEHYDROGENASE: LDH: 429 U/L — ABNORMAL HIGH (ref 98–192)

## 2020-01-24 LAB — APTT: aPTT: 42 seconds — ABNORMAL HIGH (ref 24–36)

## 2020-01-24 LAB — HEPATIC FUNCTION PANEL
ALT: 57 U/L — ABNORMAL HIGH (ref 0–44)
AST: 35 U/L (ref 15–41)
Albumin: 3.4 g/dL — ABNORMAL LOW (ref 3.5–5.0)
Alkaline Phosphatase: 124 U/L (ref 38–126)
Bilirubin, Direct: 0.4 mg/dL — ABNORMAL HIGH (ref 0.0–0.2)
Indirect Bilirubin: 1.5 mg/dL — ABNORMAL HIGH (ref 0.3–0.9)
Total Bilirubin: 1.9 mg/dL — ABNORMAL HIGH (ref 0.3–1.2)
Total Protein: 5.1 g/dL — ABNORMAL LOW (ref 6.5–8.1)

## 2020-01-24 LAB — FIBRINOGEN: Fibrinogen: 71 mg/dL — CL (ref 210–475)

## 2020-01-24 LAB — LACTIC ACID, PLASMA: Lactic Acid, Venous: 1.7 mmol/L (ref 0.5–1.9)

## 2020-01-24 MED ORDER — QUETIAPINE FUMARATE 100 MG PO TABS
100.0000 mg | ORAL_TABLET | Freq: Two times a day (BID) | ORAL | Status: DC
  Administered 2020-01-24 – 2020-01-29 (×10): 100 mg
  Filled 2020-01-24 (×10): qty 1

## 2020-01-24 MED ORDER — POTASSIUM CHLORIDE 20 MEQ/15ML (10%) PO SOLN
40.0000 meq | Freq: Once | ORAL | Status: AC
  Administered 2020-01-24: 40 meq
  Filled 2020-01-24: qty 30

## 2020-01-24 MED ORDER — QUETIAPINE FUMARATE 50 MG PO TABS
50.0000 mg | ORAL_TABLET | ORAL | Status: AC
  Administered 2020-01-24: 50 mg via ORAL
  Filled 2020-01-24: qty 1

## 2020-01-24 MED ORDER — CLONAZEPAM 0.5 MG PO TBDP
1.0000 mg | ORAL_TABLET | Freq: Four times a day (QID) | ORAL | Status: DC
  Administered 2020-01-24 – 2020-03-04 (×156): 1 mg
  Filled 2020-01-24 (×21): qty 2
  Filled 2020-01-24: qty 4
  Filled 2020-01-24 (×42): qty 2
  Filled 2020-01-24: qty 4
  Filled 2020-01-24 (×13): qty 2
  Filled 2020-01-24: qty 4
  Filled 2020-01-24 (×5): qty 2
  Filled 2020-01-24: qty 4
  Filled 2020-01-24 (×16): qty 2
  Filled 2020-01-24: qty 4
  Filled 2020-01-24 (×21): qty 2
  Filled 2020-01-24: qty 4
  Filled 2020-01-24 (×11): qty 2
  Filled 2020-01-24: qty 4
  Filled 2020-01-24 (×21): qty 2

## 2020-01-24 NOTE — Procedures (Signed)
Extracorporeal support note   ECLS support day: 9 days  Indication: COVID 19 pneumonia with ARDS.   Configuration: VV.   Drainage cannula: right IJ 2 stage cannula Return cannula: Right IJ 2 stage cannula  Pump speed: 4100 Pump flow: 5.15 Pump used: Centrimag  Oxygenator: Quadrox O2 blender: 100% Sweep gas: 8  Circuit check: no clot Anticoagulant: off anticoagulation due to ICH Anticoagulation targets: thrombocytopenic  Changes in support: continue current support.  Anticipated goals/duration of support: bridge to recovery. Patient is extubated- wean to decannulation.  Kipp Brood, MD [facility] ICU Physician [place]  Pager: [phone] Mobile: [phone] After hours: [phone].  01/24/2020, 2:11 PM

## 2020-01-24 NOTE — Progress Notes (Signed)
Pt's morning weight is 146.9 kg (repeat x2) on kreg bed. Pt was placed on new bed yesterday- unclear if bed was zeroed before patient was placed on bed.   Morning weight was done with bed in low position, one pillow, blanket, and sheet.

## 2020-01-24 NOTE — Progress Notes (Signed)
STROKE TEAM PROGRESS NOTE   INTERVAL HISTORY Patient remains critically ill with respiratory failure needing ECMO.She is now extubated but on high flow oxygen and ECMO.  Neurologically she is arousable and gets agitated needing sedation  and follows simple midline commands as per nurse but presently patient is heavily sedated and not responsive to my exam.  She remains on antibiotics for enterococcal sepsis and ECMO for refractory respiratory failure.  OBJECTIVE Vitals:   01/24/20 1415 01/24/20 1430 01/24/20 1445 01/24/20 1500  BP:      Pulse: 68 82 75 82  Resp: 16 12 (!) 8 13  Temp: (!) 95.4 F (35.2 C) (!) 95.5 F (35.3 C) (!) 95.5 F (35.3 C) (!) 95.5 F (35.3 C)  TempSrc:      SpO2: 92% (!) 86% 93% (!) 84%  Weight:      Height:       CBC:  Recent Labs  Lab 01/23/20 1700 01/23/20 1709 01/24/20 0400 01/24/20 0518  WBC 6.4  --  7.8  --   HGB 8.1*   < > 8.3* 7.8*  HCT 27.6*   < > 28.0* 23.0*  MCV 100.4*  --  101.8*  --   PLT 40*  --  42*  --    < > = values in this interval not displayed.   Basic Metabolic Panel:  Recent Labs  Lab 01/18/20 1700 01/18/20 1953 01/23/20 0436 01/23/20 0850 01/23/20 1700 01/23/20 1709 01/24/20 0400 01/24/20 0518  NA 149*   < > 148*   < > 146*   < > 149* 150*  K 5.4*   < > 4.1   < > 4.0   < > 3.8 3.6  CL 113*   < > 106  --  106  --  109  --   CO2 26   < > 31  --  29  --  31  --   GLUCOSE 336*   < > 286*  --  200*  --  294*  --   BUN 108*   < > 97*  --  113*  --  118*  --   CREATININE 1.19*   < > 1.13*  --  1.09*  --  1.08*  --   CALCIUM 8.3*   < > 8.9  --  8.7*  --  9.0  --   MG 2.7*  --  2.9*  --   --   --   --   --    < > = values in this interval not displayed.   Lipid Panel:     Component Value Date/Time   CHOL 177 09/04/2019 1522   TRIG 341 (H) 01/10/2020 0346   HDL 32.80 (L) 09/04/2019 1522   CHOLHDL 5 09/04/2019 1522   VLDL 55.4 (H) 09/04/2019 1522   LDLCALC 79 07/05/2017 0908   HgbA1c:  Lab Results  Component  Value Date   HGBA1C 10.7 (H) 01/17/2020   Urine Drug Screen: No results found for: LABOPIA, COCAINSCRNUR, LABBENZ, AMPHETMU, THCU, LABBARB  Alcohol Level No results found for: [place] 01/19/2020 Multifocal parenchymal hemorrhages, with the largest within the right parietal lobe. Together with associated edema, there is mild regional mass effect but no herniation. Considerations include sequelae of ECMO anticoagulation, hemorrhagic conversion of embolic infarcts, and vasculitis/vasculopathy.   CT HEAD WO CONTRAST 01/20/2020 No substantial change in multifocal parenchymal hemorrhages. Regional mass effect remains mild. No new hemorrhage.   CT HEAD WO CONTRAST  01/22/2020 Continued stability with no substantial change in multifocal parenchymal hemorrhages and associated mild regional mass effect. No new hemorrhage   CT CHEST ABDOMEN PELVIS W CONTRAST 01/19/2020 1. Dense airspace opacity throughout both lungs with air bronchograms, compatible with the clinical diagnosis of COVID ARDS. Dense bilateral pneumonia could also have this appearance.  2. Small to moderate-sized right pleural effusion and small left pleural effusion.  3. Small amount of ascites.  4. Colonic diverticulosis.   Transthoracic Echocardiogram  01/16/2020 1. Left ventricular ejection fraction, by estimation, is >75%. The left ventricle has hyperdynamic function. The left ventricle has no regional wall motion abnormalities. Left ventricular diastolic function could not be evaluated.  2. Right ventricular systolic function is hyperdynamic. The right ventricular size is normal. Tricuspid regurgitation signal is inadequate for assessing PA pressure.  3. The mitral valve is grossly normal. No evidence of mitral valve regurgitation.  4. The aortic valve was not well visualized. Aortic valve regurgitation is not visualized.  5. The inferior vena cava is normal in size with <50% respiratory variability,  suggesting right atrial pressure of 8 mmHg.   ECG - ST rate 132 BPM. (See cardiology reading for complete details)  PHYSICAL EXAM    Obese middle-aged Caucasian lady who is  sedated. Afebrile. Head is nontraumatic. Neck is supple without bruit.    Cardiac exam no murmur or gallop. Lungs are clear to auscultation. Distal pulses are well felt. Neurological Exam : Patient is i sedated..She is unresponsive.  She barely opens eyes to verbal stimuli.  She does not follow commands.  Eyes are slightly dysconjugate with left eyes slight hypertropia.  She follows gaze in all directions with slight limitation of upgaze.  She blinks to threat on both sides pupils 3 mm sluggishly reactive.    Weak cough and gag.  No spontaneous extremity movements and barely any withdrawal to painful stimuli in all 4 extremities.   Plantars are equivocal bilaterally.   ASSESSMENT/PLAN [name] is a 54 y.o. female with history of diabetes and morbid obesity admitted for Covid pneumonia.  She developed encephalopathy and a CT of the head revealed multifocal ICHs with largest within the right frontoparietal area.  She did not receive IV t-PA due to [place].  Multifocal parenchymal hemorrhages.  Resultant mild left hemiparesis  CT head - 01/20/20 - multifocal parenchymal hemorrhages.   2D Echo - EF > 75 %. No cardiac source of emboli identified.   [place] Corona Virus 2 - positive  LDL - triglycerides 341  HgbA1c - 10.7  VTE prophylaxis - SCDs Diet  Diet Order            Diet NPO time specified  Diet effective midnight                 No antithrombotic prior to admission, now on No antithrombotic due to intracerebral hemorrhage and low platelet count  Therapy recommendations:  pending  Disposition:  Pending  Hypertension  Home BP meds: none   Current BP meds: Levophed  Unstable . Long-term BP goal normotensive  Hyperlipidemia  Home Lipid lowering medication: none   LDL triglycerides 341,  goal < 70  Current lipid lowering medication:  None (statin contraindicated with ICH)  Continue statin at discharge  Diabetes  Home diabetic meds: insulin  Current diabetic meds: Tradjenta and insulin  HgbA1c 10.7, goal < 7.0 Recent Labs    01/24/20 0355 01/24/20 0844 01/24/20 1146  GLUCAP 274* 144* 266*    Other Stroke  Risk Factors  Former cigarette smoker - quit  Obesity, Body mass index is 55.59 kg/m., recommend weight loss, diet and exercise as appropriate   Other Active Problems  Code status - Limited code   Palliative Care Consult - 01/20/20 -> Family is not ready to transition to comfort care at this time.   Summary of Recommendations From Palliative Care:  No CPR/ACLS in the event of cardiac arrest   Family will need ongoing support   In the event of rapid decline, please allow for 3 visitors at bedside (per visitation guidelines)  Palliative Care will follow-up with family  Sunday  Anemia - Hgb - 9.9->10.2  Multi organ system failure  AKI - creatinine - 1.21->1.22  Intubated / ECMO  Bradycardia   Hospital day # 16 Patient has developed multiple intracerebral hemorrhages in the setting of severe Covid infection, enterococcal sepsis and usage of anticoagulation for ECMO.  Exact etiology of the hemorrhage is unclear but likely due to combination of the above.  Her neurological prognosis is guarded but she is likely to have some residual cognitive deficits from this but her primary prognosis is likely driven by her severe Covid infection and refractory hypoxia requiring ECMO.   patient has now been off anticoagulation for 3 to 4 days and perhaps ECMO can be managed without it.  Long discussion with Dr. Lynetta Mare critical care medicine and answered questions.  I am available to talk to family if needed This patient is critically ill and at significant risk of neurological worsening, death and care requires constant monitoring of vital signs,  hemodynamics,respiratory and cardiac monitoring, extensive review of multiple databases, frequent neurological assessment, discussion with family, other specialists and medical decision making of high complexity.I have made any additions or clarifications directly to the above note.This critical care time does not reflect procedure time, or teaching time or supervisory time of PA/NP/Med Resident etc but could involve care discussion time.  I spent 30 minutes of neurocritical care time  in the care of  this patient.   Antony Contras, MD     To contact Stroke Continuity provider, please refer to [email or web address] After hours, contact General Neurology

## 2020-01-24 NOTE — Progress Notes (Addendum)
[facility] for Infectious Disease    Date of Admission:  01/12/2020   Total days of antibiotics 7           ID: Alisha Stephenson is a 54 y.o. female with enterococcal bacteremia in setting of severe COVID-19 with multiorgan failure on ECMO Principal Problem:   Acute respiratory distress syndrome (ARDS) due to COVID-19 virus ([place]) Active Problems:   NAFLD (nonalcoholic fatty liver disease)   Diabetes mellitus type 1.5, managed as type 1 ([place])   Pneumonia due to COVID-19 virus   Intracerebral hemorrhage   Personal history of ECMO   Palliative care by specialist   Goals of care, counseling/discussion    Subjective: Afebrile, moving all extremities when sedation is decreased per RN. Remains extubated  Medications:  . sodium chloride   Intravenous Once  . artificial tears  1 application Both Eyes F1M  . vitamin C  500 mg Per Tube Daily  . chlorhexidine gluconate (MEDLINE KIT)  15 mL Mouth Rinse BID  . Chlorhexidine Gluconate Cloth  6 each Topical Daily  . clonazePAM  1 mg Per Tube Q6H  . docusate  100 mg Per Tube BID  . etomidate  40 mg Intravenous Once  . feeding supplement (PROSource TF)  45 mL Per Tube BID  . fentaNYL (SUBLIMAZE) injection  200 mcg Intravenous Once  . fentaNYL (SUBLIMAZE) injection  50 mcg Intravenous Once  . linagliptin  5 mg Per Tube Daily  . mouth rinse  15 mL Mouth Rinse 10 times per day  . methylPREDNISolone (SOLU-MEDROL) injection  20 mg Intravenous Daily  . midazolam  5 mg Intravenous Once  . nutrition supplement (JUVEN)  1 packet Per Tube BID BM  . oxyCODONE  10 mg Per Tube Q6H  . pantoprazole sodium  40 mg Per Tube Daily  . polyethylene glycol  17 g Per Tube Daily  . QUEtiapine  100 mg Per Tube BID  . QUEtiapine  50 mg Oral NOW  . sennosides  5 mL Per Tube BID  . sodium chloride flush  10-40 mL Intracatheter Q12H  . vecuronium  10 mg Intravenous Once  . zinc sulfate  220 mg Per Tube Daily    Objective: Vital signs in last 24  hours: Temp:  [95.2 F (35.1 C)-97.7 F (36.5 C)] 95.2 F (35.1 C) (08/18 1100) Pulse Rate:  [43-81] 57 (08/18 1100) Resp:  [0-25] 0 (08/18 1100) BP: (157)/(59) 157/59 (08/17 1530) SpO2:  [84 %-100 %] 98 % (08/18 1100) Arterial Line BP: (96-174)/(39-60) 138/56 (08/18 1100) FiO2 (%):  [100 %] 100 % (08/18 0800) Weight:  [146.9 kg] 146.9 kg (08/18 0500) Physical Exam  Constitutional: sedated. appears well-developed and well-nourished. No distress.  HENT: [place]/AT, PERRLA, no scleral icterus Mouth/Throat: Oropharynx is clear and moist. No oropharyngeal exudate.  Cardiovascular: Normal rate, regular rhythm and normal heart sounds. Exam reveals no gallop and no friction rub.  No murmur heard.  Pulmonary/Chest: Effort normal and breath sounds normal. No respiratory distress.  has no wheezes.  Neck = supple, no nuchal rigidity. Left triple lumen IJ, and right sided cannulation for ECMO Abdominal: Soft. Bowel sounds are decreased. Rectal tube in place Ext: anasarca, pitting edema .  Skin: Skin is warm and dry. No rash noted. No erythema.  Psychiatric: a normal mood and affect.  behavior is normal.    Lab Results Recent Labs    01/23/20 1700 01/23/20 1709 01/24/20 0400 01/24/20 0518  WBC 6.4  --  7.8  --  HGB 8.1*   < > 8.3* 7.8*  HCT 27.6*   < > 28.0* 23.0*  NA 146*   < > 149* 150*  K 4.0   < > 3.8 3.6  CL 106  --  109  --   CO2 29  --  31  --   BUN 113*  --  118*  --   CREATININE 1.09*  --  1.08*  --    < > = values in this interval not displayed.   Liver Panel Recent Labs    01/23/20 0436 01/24/20 0400  PROT 5.5* 5.1*  ALBUMIN 3.9 3.4*  AST 49* 35  ALT 64* 57*  ALKPHOS 133* 124  BILITOT 1.9* 1.9*  BILIDIR 0.5* 0.4*  IBILI 1.4* 1.5*    Microbiology: 8/12 blood cx e.faecalis (amp S) 8/15 blood cx: NGTD Studies/Results: CT Head Wo Contrast  Result Date: 01/22/2020 CLINICAL DATA:  Intracranial hemorrhage, follow-up EXAM: CT HEAD WITHOUT CONTRAST TECHNIQUE:  Contiguous axial images were obtained from the base of the skull through the vertex without intravenous contrast. COMPARISON:  01/20/2020 FINDINGS: Brain: Multifocal parenchymal hemorrhage is not substantially changed. The largest area hemorrhage in the right parietal region is similar in size. Associated edema and regional mass effect are also similar. There is no new loss of gray-white differentiation. Ventricles are stable in size. No evidence of intraventricular extension of hemorrhage. Vascular: No new findings. Skull: Calvarium is unremarkable. Sinuses/Orbits: Nonspecific paranasal sinus mucosal thickening and opacification. No new orbital finding. Other: Nonspecific mastoid and middle ear effusions. IMPRESSION: Continued stability with no substantial change in multifocal parenchymal hemorrhages and associated mild regional mass effect. No new hemorrhage. Electronically Signed   By: Macy Mis M.D.   On: 01/22/2020 14:26   DG CHEST PORT 1 VIEW  Result Date: 01/24/2020 CLINICAL DATA:  Respiratory failure.  COVID-19 positive EXAM: PORTABLE CHEST 1 VIEW COMPARISON:  January 23, 2020 FINDINGS: ECMO catheter extends along the right medial hemithorax with tip below the diaphragm. Left jugular catheter tip is in the superior vena cava. Feeding tube tip extends below the diaphragm. No pneumothorax evident. Diffuse airspace opacity with probable underlying pleural effusions persists. Diffuse airspace opacity obscures heart borders. No gross cardiomegaly appreciable. There is postoperative change in the lower cervical region. IMPRESSION: Tube and catheter positions as described without pneumothorax. Diffuse airspace opacity essentially completely obscuring the lung parenchyma bilaterally. There may be underlying pleural effusions. Appearance similar to 1 day prior. Electronically Signed   By: Lowella Grip III M.D.   On: 01/24/2020 08:20   DG CHEST PORT 1 VIEW  Result Date: 01/23/2020 CLINICAL DATA:   Respiratory failure.  COVID positive.  ECMO. EXAM: PORTABLE CHEST 1 VIEW COMPARISON:  01/23/2020. FINDINGS: Feeding tube, left IJ line, ECMO device in stable position. Complete opacification of both lungs again noted. Heart size cannot be evaluated. No pneumothorax. Prior cervical fusion. IMPRESSION: 1.  Lines and tubes including ECMO catheter stable position. 2. Complete opacification of both lungs again noted without interim change. Electronically Signed   By: Marcello Moores  Register   On: 01/23/2020 07:23   DG CHEST PORT 1 VIEW  Result Date: 01/23/2020 CLINICAL DATA:  Cardio respiratory failure EXAM: PORTABLE CHEST 1 VIEW COMPARISON:  Radiograph yesterday.  CT 01/19/2020 FINDINGS: The endotracheal tube is not visualized on the current exam. There is an enteric tube in place with tip below the diaphragm. Left internal jugular central venous catheter tip projects in the region of the upper SVC. ECMO catheter tip below  the diaphragm not included in the field of view. There is a right upper extremity PICC tip in the SVC. Near complete opacification of both hemi thoraces with only small portion of aerated lung in the upper lobes. Near complete bilateral lung white out. Cardiomediastinal contours are obscured and not well visualized. IMPRESSION: 1. Progressive advanced bilateral airspace disease with diffuse bilateral lung opacity. 2. Endotracheal tube is not visualized on the current exam. Remaining support apparatus unchanged. Electronically Signed   By: Keith Rake M.D.   On: 01/23/2020 00:55   [place] EKG SITE RITE  Result Date: 01/24/2020 If Site Rite image not attached, placement could not be confirmed due to current cardiac rhythm.    Assessment/Plan: Enterococcal bacteremia = unclear where source originated. Patient remains critically ill for further work up (for example to rule out endocarditis). We recommend to give a minimum of 2 wks using 8/15 as day 1 thus end date would be 8/29. Should she improve  considerably and able to tolerate TEE in the future, this would be helpful to deem if she has endocarditis- which would change therapy and length of abtx.  picc line dysfunction = on today's exam, it appeared that it has migrated out roughly 2 inches. Recommend to pull out picc line and follow site to see that there is not any insertion site induration or cellulitis  mssa pneumonia = covered by current abtx  Critical illness 2/2 COVID-19 with ECMO needs = managed by critical care and cardiology  [facility] Cell: [phone] Pager: [phone]  01/24/2020, 11:15 AM

## 2020-01-24 NOTE — Progress Notes (Signed)
Advanced Heart Failure Rounding Note   Subjective:    - 8/2 COVID + test - 8/9 Cannulated for VV ECMO - 8/13 with several areas of intracranial hemorrhage. Bival stopped.  - 8/14 CT no change in [place]. Increased edema - 8/16 Extubated - 8/16 Head CT stable bleed  Will awaken and get agitated. Then gets resedated. Off/on NE and labetalol. On heated high flow  On high-dose zosyn and vanc for enterococcus in blood and MSSA in sputum   CXR stable diffuse infiltrates No change  PLTs remain 31K -> 38k . Fibrinogen 145 -> 71 (got cryo)  ECMO   Flow 5.1L RPM 4100 Sweep6.5 -> 4 -> 8.5 -> 8  Labs:  7.42/50/56/91% Hgb8.3 PLT 31 -> 38 LDH 429 PTT => off bival Lactic acid1.6-> 1.5 -> 2.0 -> 1.7  Objective:   Weight Range:  Vital Signs:   Temp:  [95.9 F (35.5 C)-97.7 F (36.5 C)] 95.9 F (35.5 C) (08/18 0715) Pulse Rate:  [52-81] 56 (08/18 0730) Resp:  [0-26] 0 (08/18 0715) BP: (132-157)/(48-59) 157/59 (08/17 1530) SpO2:  [85 %-100 %] 98 % (08/18 0730) Arterial Line BP: (97-170)/(43-60) 117/50 (08/18 0715) FiO2 (%):  [100 %] 100 % (08/18 0730) Weight:  [146.9 kg] 146.9 kg (08/18 0500) Last BM Date: 01/23/20  Weight change: Filed Weights   01/22/20 0335 01/23/20 0500 01/24/20 0500  Weight: 135.9 kg 131.8 kg (!) 146.9 kg    Intake/Output:   Intake/Output Summary (Last 24 hours) at 01/24/2020 0749 Last data filed at 01/24/2020 0700 Gross per 24 hour  Intake 3832.19 ml  Output 2925 ml  Net 907.19 ml     Physical Exam: General:  Ill appearing. Sedated HEENT: normal Neck: supple. RIJ ECMO cannula  Cor: PMI nondisplaced. Regular rate & rhythm. No rubs, gallops or murmurs. Lungs: coarse tachpyneic Abdomen: obese soft, nontender, nondistended. No hepatosplenomegaly. No bruits or masses. Good bowel sounds. Extremities: no cyanosis, clubbing, rash, 2-3+ edema Neuro: alert & orientedx3, cranial nerves grossly intact. moves all 4 extremities w/o difficulty.  Affect pleasant   Telemetry: Sinus 50-60s Personally reviewed   Labs: Basic Metabolic Panel: Recent Labs  Lab 01/18/20 1700 01/18/20 1953 01/22/20 0235 01/22/20 0409 01/22/20 1600 01/22/20 1940 01/23/20 0436 01/23/20 0850 01/23/20 1700 01/23/20 1700 01/23/20 1709 01/23/20 1948 01/24/20 0018 01/24/20 0400 01/24/20 0518  NA 149*   < > 151*   < > 151*   < > 148*   < > 146*   < > 151* 151* 151* 149* 150*  K 5.4*   < > 4.0   < > 4.6   < > 4.1   < > 4.0   < > 3.9 4.3 4.4 3.8 3.6  CL 113*   < > 104  --  107  --  106  --  106  --   --   --   --  109  --   CO2 26   < > 33*  --  32  --  31  --  29  --   --   --   --  31  --   GLUCOSE 336*   < > 169*  --  181*  --  286*  --  200*  --   --   --   --  294*  --   BUN 108*   < > 83*  --  86*  --  97*  --  113*  --   --   --   --  118*  --   CREATININE 1.19*   < > 1.08*  --  1.04*  --  1.13*  --  1.09*  --   --   --   --  1.08*  --   CALCIUM 8.3*   < > 9.0   < > 9.1   < > 8.9  --  8.7*  --   --   --   --  9.0  --   MG 2.7*  --   --   --   --   --  2.9*  --   --   --   --   --   --   --   --    < > = values in this interval not displayed.    Liver Function Tests: Recent Labs  Lab 01/20/20 0347 01/21/20 0355 01/22/20 0235 01/23/20 0436 01/24/20 0400  AST 97* 93* 79* 49* 35  ALT 104* 88* 62* 64* 57*  ALKPHOS 189* 167* 132* 133* 124  BILITOT 1.7* 2.5* 2.2* 1.9* 1.9*  PROT 4.6* 5.1* 6.0* 5.5* 5.1*  ALBUMIN 2.5* 2.7* 4.5 3.9 3.4*   No results for input(s): LIPASE, AMYLASE in the last 168 hours. No results for input(s): AMMONIA in the last 168 hours.  CBC: Recent Labs  Lab 01/22/20 0235 01/22/20 0409 01/22/20 0923 01/22/20 0932 01/22/20 1600 01/22/20 1940 01/23/20 0436 01/23/20 0850 01/23/20 1700 01/23/20 1700 01/23/20 1709 01/23/20 1948 01/24/20 0018 01/24/20 0400 01/24/20 0518  WBC 8.0  --   --   --  9.1  --  6.6  --  6.4  --   --   --   --  7.8  --   HGB 9.1*   < >  --    < > 9.2*   < > 8.2*   < > 8.1*   < > 8.5*  7.8* 7.8* 8.3* 7.8*  HCT 29.5*   < >  --    < > 31.1*   < > 27.5*   < > 27.6*   < > 25.0* 23.0* 23.0* 28.0* 23.0*  MCV 96.7  --   --   --  97.2  --  98.9  --  100.4*  --   --   --   --  101.8*  --   PLT 31*   < > 29*  --  37*  --  38*  --  40*  --   --   --   --  42*  --    < > = values in this interval not displayed.    Cardiac Enzymes: No results for input(s): CKTOTAL, CKMB, CKMBINDEX, TROPONINI in the last 168 hours.  BNP: BNP (last 3 results) No results for input(s): BNP in the last 8760 hours.  ProBNP (last 3 results) No results for input(s): PROBNP in the last 8760 hours.    Other results:  Imaging: CT Head Wo Contrast  Result Date: 01/22/2020 CLINICAL DATA:  Intracranial hemorrhage, follow-up EXAM: CT HEAD WITHOUT CONTRAST TECHNIQUE: Contiguous axial images were obtained from the base of the skull through the vertex without intravenous contrast. COMPARISON:  01/20/2020 FINDINGS: Brain: Multifocal parenchymal hemorrhage is not substantially changed. The largest area hemorrhage in the right parietal region is similar in size. Associated edema and regional mass effect are also similar. There is no new loss of gray-white differentiation. Ventricles are stable in size. No evidence of intraventricular extension of hemorrhage. Vascular: No new findings. Skull: Calvarium is unremarkable. Sinuses/Orbits: Nonspecific  paranasal sinus mucosal thickening and opacification. No new orbital finding. Other: Nonspecific mastoid and middle ear effusions. IMPRESSION: Continued stability with no substantial change in multifocal parenchymal hemorrhages and associated mild regional mass effect. No new hemorrhage. Electronically Signed   By: Macy Mis M.D.   On: 01/22/2020 14:26   DG CHEST PORT 1 VIEW  Result Date: 01/23/2020 CLINICAL DATA:  Respiratory failure.  COVID positive.  ECMO. EXAM: PORTABLE CHEST 1 VIEW COMPARISON:  01/23/2020. FINDINGS: Feeding tube, left IJ line, ECMO device in stable  position. Complete opacification of both lungs again noted. Heart size cannot be evaluated. No pneumothorax. Prior cervical fusion. IMPRESSION: 1.  Lines and tubes including ECMO catheter stable position. 2. Complete opacification of both lungs again noted without interim change. Electronically Signed   By: Marcello Moores  Register   On: 01/23/2020 07:23   DG CHEST PORT 1 VIEW  Result Date: 01/23/2020 CLINICAL DATA:  Cardio respiratory failure EXAM: PORTABLE CHEST 1 VIEW COMPARISON:  Radiograph yesterday.  CT 01/19/2020 FINDINGS: The endotracheal tube is not visualized on the current exam. There is an enteric tube in place with tip below the diaphragm. Left internal jugular central venous catheter tip projects in the region of the upper SVC. ECMO catheter tip below the diaphragm not included in the field of view. There is a right upper extremity PICC tip in the SVC. Near complete opacification of both hemi thoraces with only small portion of aerated lung in the upper lobes. Near complete bilateral lung white out. Cardiomediastinal contours are obscured and not well visualized. IMPRESSION: 1. Progressive advanced bilateral airspace disease with diffuse bilateral lung opacity. 2. Endotracheal tube is not visualized on the current exam. Remaining support apparatus unchanged. Electronically Signed   By: Keith Rake M.D.   On: 01/23/2020 00:55     Medications:     Scheduled Medications:  sodium chloride   Intravenous Once   artificial tears  1 application Both Eyes L0B   vitamin C  500 mg Per Tube Daily   chlorhexidine gluconate (MEDLINE KIT)  15 mL Mouth Rinse BID   Chlorhexidine Gluconate Cloth  6 each Topical Daily   clonazePAM  1 mg Per Tube TID   docusate  100 mg Per Tube BID   etomidate  40 mg Intravenous Once   feeding supplement (PROSource TF)  45 mL Per Tube BID   fentaNYL (SUBLIMAZE) injection  200 mcg Intravenous Once   fentaNYL (SUBLIMAZE) injection  50 mcg Intravenous Once    linagliptin  5 mg Per Tube Daily   mouth rinse  15 mL Mouth Rinse 10 times per day   methylPREDNISolone (SOLU-MEDROL) injection  20 mg Intravenous Daily   midazolam  5 mg Intravenous Once   nutrition supplement (JUVEN)  1 packet Per Tube BID BM   oxyCODONE  10 mg Per Tube Q6H   pantoprazole sodium  40 mg Per Tube Daily   polyethylene glycol  17 g Per Tube Daily   QUEtiapine  50 mg Per Tube BID   sennosides  5 mL Per Tube BID   sodium chloride flush  10-40 mL Intracatheter Q12H   vecuronium  10 mg Intravenous Once   zinc sulfate  220 mg Per Tube Daily    Infusions:  sodium chloride 250 mL (01/21/20 1227)   sodium chloride Stopped (01/23/2020 1009)   albumin human 12.5 g (01/21/20 2125)   dexmedetomidine (PRECEDEX) IV infusion 1.2 mcg/kg/hr (01/24/20 0702)   feeding supplement (PIVOT 1.5 CAL) 1,000 mL (01/24/20 0345)  fentaNYL infusion INTRAVENOUS 100 mcg/hr (01/24/20 0703)   insulin 13 mL/hr at 01/24/20 0700   labetalol (NORMODYNE) infusion 5 mg/mL Stopped (01/24/20 0505)   norepinephrine (LEVOPHED) Adult infusion 4 mcg/min (01/24/20 0700)   piperacillin-tazobactam (ZOSYN)  IV Stopped (01/24/20 0540)    PRN Medications: sodium chloride, acetaminophen (TYLENOL) oral liquid 160 mg/5 mL, albumin human, albuterol, dextrose, fentaNYL, fentaNYL, guaiFENesin-dextromethorphan, hydrALAZINE, hydrOXYzine, ondansetron **OR** ondansetron (ZOFRAN) IV, sodium chloride flush   Assessment/Plan:   1. Acute hypoxic/hypercapneic respiratory failure in setting of severe COVID PNA/ARDS -> VV ECMO - admit 8/2 - intubation 8/3 - has received actmera (compelted 8/2), remdesivir (completed 8/6) and steroids - failed full vent support with proning/paralytic - Cannulated for VV ECMO on 8/9 - Extubated 8/16 - Agitated and hypertensive when awakens. Adjusting sedation - CXR with diffuse infiltrates. Mildly improved  - ECMO circuit ok  Despite lack of AC. LDH coming down  - Off bival  due to [place]. Repeat head CT yesterday. Stable.  - Neuro has seen - can consider restarting low-dose AC in near future if circuit requires - Continue abx.  - Will continue full support - Likely will need trach prior to ECMO decannulation if she gets that far - Diurese slowly  2. Enterococcus sepsis - continue vanc + high dose zosyn per pharmacy  - No change  3. Intracranial hemorrhage - ? Septic emboli - repeat head CT on 8/14 with stable bleeds but increased edema - neurology following. Suspect significant long-term injury sustained. Will follow commands. Appears to have dense LUE weakness and possibly LLE - repeat head CT stable 8/16 - No AC with ICH for now - Will need TEE when platelets up   4. Thrombocytopenia - suspect due to sepsis.  - no obvious bleeding - would not transfuse PLTs unless absolutely necessary given risk of clotting pump - no change  4. Morbid obesity - Body mass index is 49.38 kg/m.  5. Poorly controlled DM2 - HgBA1c 10.7 - CBGs remain elevated - On IV insulin adjust as needed  4. Hypernatremia - improved. Stop free water boluses given ICH and benefit of permissive hypernatremia  5. Lactic acidosis -  improved   CRITICAL CARE Performed by: Glori Bickers  Total critical care time: 35 minutes  Critical care time was exclusive of separately billable procedures and treating other patients.  Critical care was necessary to treat or prevent imminent or life-threatening deterioration.  Critical care was time spent personally by me (independent of midlevel providers or residents) on the following activities: development of treatment plan with patient and/or surrogate as well as nursing, discussions with consultants, evaluation of patient's response to treatment, examination of patient, obtaining history from patient or surrogate, ordering and performing treatments and interventions, ordering and review of laboratory studies, ordering and review of  radiographic studies, pulse oximetry and re-evaluation of patient's condition.   Length of Stay: 16   Glori Bickers  MD 01/24/2020, 7:49 AM  Advanced Heart Failure Team Pager [phone] (M-F; [place])  Please contact [facility] for night-coverage after hours (4p -7a ) and weekends on [email or web address]

## 2020-01-24 NOTE — Progress Notes (Signed)
Peripherally Inserted Central Catheter Placement  The IV Nurse has discussed with the patient and/or persons authorized to consent for the patient, the purpose of this procedure and the potential benefits and risks involved with this procedure.  The benefits include less needle sticks, lab draws from the catheter, and the patient may be discharged home with the catheter. Risks include, but not limited to, infection, bleeding, blood clot (thrombus formation), and puncture of an artery; nerve damage and irregular heartbeat and possibility to perform a PICC exchange if needed/ordered by physician.  Alternatives to this procedure were also discussed.  B[employer] Power PICC patient education guide, fact sheet on infection prevention and patient information card has been provided to patient /or left at bedside.    PICC Placement Documentation  PICC Triple Lumen 43/73/57 PICC Right Basilic 41 cm 0 cm (Active)  Indication for Insertion or Continuance of Line Prolonged intravenous therapies 01/24/20 1414  Exposed Catheter (cm) 0 cm 01/24/20 1414  Site Assessment Clean;Dry;Intact 01/24/20 1414  Lumen #1 Status Flushed;Blood return noted 01/24/20 1414  Lumen #2 Status Flushed;Blood return noted 01/24/20 1414  Lumen #3 Status Flushed;Blood return noted 01/24/20 1414  Dressing Type Transparent 01/24/20 1414  Dressing Status Clean;Dry;Intact;Antimicrobial disc in place;Other (Comment) 01/24/20 1414  Dressing Intervention New dressing 01/24/20 1414  Dressing Change Due 01/31/20 01/24/20 1414   PICC EXCHANGE    Alisha Stephenson 01/24/2020, 2:15 PM

## 2020-01-24 NOTE — Progress Notes (Signed)
NAME:  Alisha Stephenson, MRN:  [id number], DOB:  January 07, 1966, LOS: 65 ADMISSION DATE:  01/11/2020, CONSULTATION DATE:  8/3 REFERRING MD:  Alfredia Ferguson, CHIEF COMPLAINT:  Dyspnea   Brief History   54 y/o female admitted on 8/2 with severe acute respiratory failure with hypoxemia due to COVID 19 pneumonia.  She developed symptoms 1 week prior to admission.  Past Medical History  DM2 Diverticulitis Gallstones Ovarian cyst NAFLD Asthma  Significant Hospital Events   8/2 admit 8/3 ICU transfer, intubated 8/4 prone, paralyze 8/9 significant desaturations today 8/9 VV ECMO cannulation 8/13 head bleed  Consults:  PCCM ECMO team    Procedures:  8/3 ETT >  8/3 PICC >  8/9 LIJ MML 8/9 RIJ [place] 78F   Significant Diagnostic Tests:  7/31 CT head > NAICP 7/31 MRI/MRA brain > no acute changes, possibly small aneurysm ACOM 8/13 CT head> multiple areas of ICH  Micro Data:  8/2 blood > NG 8/2 SARS COV 2 > positive 8/4 resp > negative 8/4 urine >  8/12 blood > E. faecalis (pan-sensitive) 8/12 resp: staph aureus> MSSA  Antimicrobials:  8/2 remdesivir > 8/6 8/2 actemra  8/2 solumedrol >   8/3 ceftriaxone >  8/5 8/3 azithro >  8/5  8/12 meropenem>8/16 8/12 vanc>8/16  8/13 zosyn >> 8/20 (planned)  Interim history/subjective:  Extubated. Encephalopathic and delirious with associated hypertension, improved with sedation.  Objective   Blood pressure (!) 157/59, pulse 76, temperature (!) 95.4 F (35.2 C), resp. rate 15, height 5\' 4"  (1.626 m), weight (!) 146.9 kg, SpO2 (!) 84 %. CVP:  [11 mmHg-18 mmHg] 11 mmHg  FiO2 (%):  [100 %] 100 %   Intake/Output Summary (Last 24 hours) at 01/24/2020 1416 Last data filed at 01/24/2020 1200 Gross per 24 hour  Intake 3781.38 ml  Output 3225 ml  Net 556.38 ml   Filed Weights   01/22/20 0335 01/23/20 0500 01/24/20 0500  Weight: 135.9 kg 131.8 kg (!) 146.9 kg    Examination:  GEN: obtunded woman laying in bed HEENT: RIJ ecmo cannula  CDI, minimal oozing CV: RRR, ext warm PULM: intermittent apnea, extremely poor air movement when breaths GI: Soft, +BS EXT: trace global anasarca NEURO: heavily sedated but winces to pain PSYCH: RASS -4 SKIN: better color than yesterday    Resolved Hospital Problem list     Assessment & Plan:  Acute hypoxic and AoC hypercapneic respiratory failure; likely underly OHS (baseline bicarb 27-30) ARDS due to COVID 19 pneumonia   VV ECMO cannulation on 8/9 for refractory hypoxemia MSSA pneumonia Enterococcal bacteremia AKI; severe azotemia Elevated LDH, hypo-fibrinogenemia and thrombocytopenia; most likely due to sepsis, ?liver dx likely. Hb stable and low platelets but no schistocytes on smear suggest DIC is not cause.   Improved oozing and labs with cryo x 1 8/16. ICH, multifocal. L-sided weakness.  Encephalopathy- ICU delirium + ischemic injury + azotemia -Diuresis discussed daily with CHF team: leave alone today - Lighten sedation a bit today to see if we can find happy medium between apnea and delirium - Given ongoing issues with coagulopathy, thrombocytopenia and lack of clear indication, I am not inclined to put her back on vent at this time, she is saturating 100% with good ABG while apneic -Appreciate Dr. Clydene Fake input; okay to start low dose AC if we need to; going to hold off given ongoing mucosal bleeding, ongoing coagulopathy creating unclear monitoring for AC level, stable LDH, and lack of issues in circuit -PT and OT consults  Shock related to sedation- norepinephrine PRN  Hyperglycemia > not controlled at all with [place] insulin despite aggressive upward titration.  Assume poor absorption, potentially due to subcutaneous edema.    -Remains on insulin infusion -Goal BG 140-180 while admitted to the ICU -Continue linagliptin to decrease insulin requirements  Constipation, resolved  Hypernatremia  -discontinue free water repletion- will tolerate hypernatremia for now given  concern for intracranial hypertension -con't to monitor -avoid NS due to chloride content  Acute anemia due to critical illness -transfuse for Hb <8 -con't to monitor  Best practice:  Diet: tube feeding Pain/Anxiety/Delirium protocol (if indicated): as above VAP protocol (if indicated): yes DVT prophylaxis: SCDs while bivalirudin off GI prophylaxis: famotidine Glucose control:  insulin infusion Mobility: bed rest Code Status: full Family Communication: called [name] 8/17 Disposition: ICU  CRITICAL CARE Performed by: Kipp Brood   Total critical care time: 40 minutes  Critical care time was exclusive of separately billable procedures and treating other patients.  Critical care was necessary to treat or prevent imminent or life-threatening deterioration.  Critical care was time spent personally by me on the following activities: development of treatment plan with patient and/or surrogate as well as nursing, discussions with consultants, evaluation of patient's response to treatment, examination of patient, obtaining history from patient or surrogate, ordering and performing treatments and interventions, ordering and review of laboratory studies, ordering and review of radiographic studies, pulse oximetry, re-evaluation of patient's condition and participation in multidisciplinary rounds.  Kipp Brood, MD [facility] ICU Physician [place]  Pager: [phone] Mobile: [phone] After hours: [phone].

## 2020-01-25 ENCOUNTER — Inpatient Hospital Stay (HOSPITAL_COMMUNITY): Payer: PRIVATE HEALTH INSURANCE

## 2020-01-25 DIAGNOSIS — U071 COVID-19: Secondary | ICD-10-CM | POA: Diagnosis not present

## 2020-01-25 DIAGNOSIS — J80 Acute respiratory distress syndrome: Secondary | ICD-10-CM | POA: Diagnosis not present

## 2020-01-25 LAB — POCT I-STAT 7, (LYTES, BLD GAS, ICA,H+H)
Acid-Base Excess: 1 mmol/L (ref 0.0–2.0)
Acid-Base Excess: 1 mmol/L (ref 0.0–2.0)
Acid-Base Excess: 11 mmol/L — ABNORMAL HIGH (ref 0.0–2.0)
Acid-Base Excess: 4 mmol/L — ABNORMAL HIGH (ref 0.0–2.0)
Acid-Base Excess: 5 mmol/L — ABNORMAL HIGH (ref 0.0–2.0)
Acid-Base Excess: 6 mmol/L — ABNORMAL HIGH (ref 0.0–2.0)
Acid-Base Excess: 7 mmol/L — ABNORMAL HIGH (ref 0.0–2.0)
Acid-Base Excess: 7 mmol/L — ABNORMAL HIGH (ref 0.0–2.0)
Bicarbonate: 26.7 mmol/L (ref 20.0–28.0)
Bicarbonate: 27.7 mmol/L (ref 20.0–28.0)
Bicarbonate: 31.1 mmol/L — ABNORMAL HIGH (ref 20.0–28.0)
Bicarbonate: 31.3 mmol/L — ABNORMAL HIGH (ref 20.0–28.0)
Bicarbonate: 32.8 mmol/L — ABNORMAL HIGH (ref 20.0–28.0)
Bicarbonate: 32.9 mmol/L — ABNORMAL HIGH (ref 20.0–28.0)
Bicarbonate: 33.5 mmol/L — ABNORMAL HIGH (ref 20.0–28.0)
Bicarbonate: 34.9 mmol/L — ABNORMAL HIGH (ref 20.0–28.0)
Calcium, Ion: 1.2 mmol/L (ref 1.15–1.40)
Calcium, Ion: 1.23 mmol/L (ref 1.15–1.40)
Calcium, Ion: 1.24 mmol/L (ref 1.15–1.40)
Calcium, Ion: 1.27 mmol/L (ref 1.15–1.40)
Calcium, Ion: 1.27 mmol/L (ref 1.15–1.40)
Calcium, Ion: 1.29 mmol/L (ref 1.15–1.40)
Calcium, Ion: 1.31 mmol/L (ref 1.15–1.40)
Calcium, Ion: 1.33 mmol/L (ref 1.15–1.40)
HCT: 22 % — ABNORMAL LOW (ref 36.0–46.0)
HCT: 22 % — ABNORMAL LOW (ref 36.0–46.0)
HCT: 23 % — ABNORMAL LOW (ref 36.0–46.0)
HCT: 23 % — ABNORMAL LOW (ref 36.0–46.0)
HCT: 24 % — ABNORMAL LOW (ref 36.0–46.0)
HCT: 24 % — ABNORMAL LOW (ref 36.0–46.0)
HCT: 25 % — ABNORMAL LOW (ref 36.0–46.0)
HCT: 26 % — ABNORMAL LOW (ref 36.0–46.0)
Hemoglobin: 7.5 g/dL — ABNORMAL LOW (ref 12.0–15.0)
Hemoglobin: 7.5 g/dL — ABNORMAL LOW (ref 12.0–15.0)
Hemoglobin: 7.8 g/dL — ABNORMAL LOW (ref 12.0–15.0)
Hemoglobin: 7.8 g/dL — ABNORMAL LOW (ref 12.0–15.0)
Hemoglobin: 8.2 g/dL — ABNORMAL LOW (ref 12.0–15.0)
Hemoglobin: 8.2 g/dL — ABNORMAL LOW (ref 12.0–15.0)
Hemoglobin: 8.5 g/dL — ABNORMAL LOW (ref 12.0–15.0)
Hemoglobin: 8.8 g/dL — ABNORMAL LOW (ref 12.0–15.0)
O2 Saturation: 100 %
O2 Saturation: 51 %
O2 Saturation: 80 %
O2 Saturation: 82 %
O2 Saturation: 83 %
O2 Saturation: 86 %
O2 Saturation: 86 %
O2 Saturation: 96 %
Patient temperature: 35.4
Patient temperature: 35.5
Patient temperature: 35.5
Patient temperature: 36.8
Patient temperature: 37.1
Patient temperature: 37.2
Patient temperature: 37.3
Patient temperature: 37.5
Potassium: 4.3 mmol/L (ref 3.5–5.1)
Potassium: 4.5 mmol/L (ref 3.5–5.1)
Potassium: 4.9 mmol/L (ref 3.5–5.1)
Potassium: 5.5 mmol/L — ABNORMAL HIGH (ref 3.5–5.1)
Potassium: 5.5 mmol/L — ABNORMAL HIGH (ref 3.5–5.1)
Potassium: 5.6 mmol/L — ABNORMAL HIGH (ref 3.5–5.1)
Potassium: 5.6 mmol/L — ABNORMAL HIGH (ref 3.5–5.1)
Potassium: 5.6 mmol/L — ABNORMAL HIGH (ref 3.5–5.1)
Sodium: 151 mmol/L — ABNORMAL HIGH (ref 135–145)
Sodium: 152 mmol/L — ABNORMAL HIGH (ref 135–145)
Sodium: 153 mmol/L — ABNORMAL HIGH (ref 135–145)
Sodium: 153 mmol/L — ABNORMAL HIGH (ref 135–145)
Sodium: 155 mmol/L — ABNORMAL HIGH (ref 135–145)
Sodium: 155 mmol/L — ABNORMAL HIGH (ref 135–145)
Sodium: 155 mmol/L — ABNORMAL HIGH (ref 135–145)
Sodium: 156 mmol/L — ABNORMAL HIGH (ref 135–145)
TCO2: 28 mmol/L (ref 22–32)
TCO2: 29 mmol/L (ref 22–32)
TCO2: 33 mmol/L — ABNORMAL HIGH (ref 22–32)
TCO2: 33 mmol/L — ABNORMAL HIGH (ref 22–32)
TCO2: 34 mmol/L — ABNORMAL HIGH (ref 22–32)
TCO2: 35 mmol/L — ABNORMAL HIGH (ref 22–32)
TCO2: 35 mmol/L — ABNORMAL HIGH (ref 22–32)
TCO2: 36 mmol/L — ABNORMAL HIGH (ref 22–32)
pCO2 arterial: 42.1 mmHg (ref 32.0–48.0)
pCO2 arterial: 48.9 mmHg — ABNORMAL HIGH (ref 32.0–48.0)
pCO2 arterial: 50.4 mmHg — ABNORMAL HIGH (ref 32.0–48.0)
pCO2 arterial: 50.7 mmHg — ABNORMAL HIGH (ref 32.0–48.0)
pCO2 arterial: 56.9 mmHg — ABNORMAL HIGH (ref 32.0–48.0)
pCO2 arterial: 61.5 mmHg — ABNORMAL HIGH (ref 32.0–48.0)
pCO2 arterial: 64.1 mmHg — ABNORMAL HIGH (ref 32.0–48.0)
pCO2 arterial: 65.4 mmHg (ref 32.0–48.0)
pH, Arterial: 7.286 — ABNORMAL LOW (ref 7.350–7.450)
pH, Arterial: 7.298 — ABNORMAL LOW (ref 7.350–7.450)
pH, Arterial: 7.316 — ABNORMAL LOW (ref 7.350–7.450)
pH, Arterial: 7.331 — ABNORMAL LOW (ref 7.350–7.450)
pH, Arterial: 7.345 — ABNORMAL LOW (ref 7.350–7.450)
pH, Arterial: 7.395 (ref 7.350–7.450)
pH, Arterial: 7.429 (ref 7.350–7.450)
pH, Arterial: 7.521 — ABNORMAL HIGH (ref 7.350–7.450)
pO2, Arterial: 258 mmHg — ABNORMAL HIGH (ref 83.0–108.0)
pO2, Arterial: 32 mmHg — CL (ref 83.0–108.0)
pO2, Arterial: 47 mmHg — ABNORMAL LOW (ref 83.0–108.0)
pO2, Arterial: 49 mmHg — ABNORMAL LOW (ref 83.0–108.0)
pO2, Arterial: 49 mmHg — ABNORMAL LOW (ref 83.0–108.0)
pO2, Arterial: 51 mmHg — ABNORMAL LOW (ref 83.0–108.0)
pO2, Arterial: 55 mmHg — ABNORMAL LOW (ref 83.0–108.0)
pO2, Arterial: 67 mmHg — ABNORMAL LOW (ref 83.0–108.0)

## 2020-01-25 LAB — LACTIC ACID, PLASMA
Lactic Acid, Venous: 3.1 mmol/L (ref 0.5–1.9)
Lactic Acid, Venous: 3.5 mmol/L (ref 0.5–1.9)

## 2020-01-25 LAB — CBC
HCT: 27.2 % — ABNORMAL LOW (ref 36.0–46.0)
HCT: 27.3 % — ABNORMAL LOW (ref 36.0–46.0)
Hemoglobin: 8 g/dL — ABNORMAL LOW (ref 12.0–15.0)
Hemoglobin: 7.7 g/dL — ABNORMAL LOW (ref 12.0–15.0)
MCH: 30.1 pg (ref 26.0–34.0)
MCH: 30.1 pg (ref 26.0–34.0)
MCHC: 29.4 g/dL — ABNORMAL LOW (ref 30.0–36.0)
MCHC: 28.2 g/dL — ABNORMAL LOW (ref 30.0–36.0)
MCV: 102.3 fL — ABNORMAL HIGH (ref 80.0–100.0)
MCV: 106.6 fL — ABNORMAL HIGH (ref 80.0–100.0)
Platelets: 39 10*3/uL — ABNORMAL LOW (ref 150–400)
Platelets: 45 10*3/uL — ABNORMAL LOW (ref 150–400)
RBC: 2.66 MIL/uL — ABNORMAL LOW (ref 3.87–5.11)
RBC: 2.56 MIL/uL — ABNORMAL LOW (ref 3.87–5.11)
RDW: 19.7 % — ABNORMAL HIGH (ref 11.5–15.5)
RDW: 21 % — ABNORMAL HIGH (ref 11.5–15.5)
WBC: 7.8 10*3/uL (ref 4.0–10.5)
WBC: 16.5 10*3/uL — ABNORMAL HIGH (ref 4.0–10.5)
nRBC: 4.1 % — ABNORMAL HIGH (ref 0.0–0.2)
nRBC: 9.5 % — ABNORMAL HIGH (ref 0.0–0.2)

## 2020-01-25 LAB — GLUCOSE, CAPILLARY
Glucose-Capillary: 151 mg/dL — ABNORMAL HIGH (ref 70–99)
Glucose-Capillary: 161 mg/dL — ABNORMAL HIGH (ref 70–99)
Glucose-Capillary: 252 mg/dL — ABNORMAL HIGH (ref 70–99)
Glucose-Capillary: 242 mg/dL — ABNORMAL HIGH (ref 70–99)
Glucose-Capillary: 210 mg/dL — ABNORMAL HIGH (ref 70–99)
Glucose-Capillary: 171 mg/dL — ABNORMAL HIGH (ref 70–99)
Glucose-Capillary: 241 mg/dL — ABNORMAL HIGH (ref 70–99)
Glucose-Capillary: 264 mg/dL — ABNORMAL HIGH (ref 70–99)
Glucose-Capillary: 246 mg/dL — ABNORMAL HIGH (ref 70–99)
Glucose-Capillary: 221 mg/dL — ABNORMAL HIGH (ref 70–99)
Glucose-Capillary: 218 mg/dL — ABNORMAL HIGH (ref 70–99)

## 2020-01-25 LAB — BASIC METABOLIC PANEL
Anion gap: 10 (ref 5–15)
Anion gap: 14 (ref 5–15)
BUN: 117 mg/dL — ABNORMAL HIGH (ref 6–20)
BUN: 150 mg/dL — ABNORMAL HIGH (ref 6–20)
CO2: 30 mmol/L (ref 22–32)
CO2: 26 mmol/L (ref 22–32)
Calcium: 9.3 mg/dL (ref 8.9–10.3)
Calcium: 9.1 mg/dL (ref 8.9–10.3)
Chloride: 114 mmol/L — ABNORMAL HIGH (ref 98–111)
Chloride: 112 mmol/L — ABNORMAL HIGH (ref 98–111)
Creatinine, Ser: 1.04 mg/dL — ABNORMAL HIGH (ref 0.44–1.00)
Creatinine, Ser: 1.48 mg/dL — ABNORMAL HIGH (ref 0.44–1.00)
GFR calc Af Amer: >60 mL/min (ref >60)
GFR calc Af Amer: 46 mL/min — ABNORMAL LOW (ref >60)
GFR calc non Af Amer: >60 mL/min (ref >60)
GFR calc non Af Amer: 39 mL/min — ABNORMAL LOW (ref >60)
Glucose, Bld: 168 mg/dL — ABNORMAL HIGH (ref 70–99)
Glucose, Bld: 261 mg/dL — ABNORMAL HIGH (ref 70–99)
Potassium: 4.7 mmol/L (ref 3.5–5.1)
Potassium: 5.8 mmol/L — ABNORMAL HIGH (ref 3.5–5.1)
Sodium: 154 mmol/L — ABNORMAL HIGH (ref 135–145)
Sodium: 152 mmol/L — ABNORMAL HIGH (ref 135–145)

## 2020-01-25 LAB — TYPE AND SCREEN
ABO/RH(D): O POS
Antibody Screen: NEGATIVE
Unit division: 0
Unit division: 0
Unit division: 0
Unit division: 0

## 2020-01-25 LAB — HEPATIC FUNCTION PANEL
ALT: 53 U/L — ABNORMAL HIGH (ref 0–44)
AST: 35 U/L (ref 15–41)
Albumin: 3.3 g/dL — ABNORMAL LOW (ref 3.5–5.0)
Alkaline Phosphatase: 111 U/L (ref 38–126)
Bilirubin, Direct: 0.4 mg/dL — ABNORMAL HIGH (ref 0.0–0.2)
Indirect Bilirubin: 1.3 mg/dL — ABNORMAL HIGH (ref 0.3–0.9)
Total Bilirubin: 1.7 mg/dL — ABNORMAL HIGH (ref 0.3–1.2)
Total Protein: 5.3 g/dL — ABNORMAL LOW (ref 6.5–8.1)

## 2020-01-25 LAB — BPAM RBC
Blood Product Expiration Date: 202109122359
Blood Product Expiration Date: 202109122359
Blood Product Expiration Date: 202109122359
Blood Product Expiration Date: 202109122359
Unit Type and Rh: 5100
Unit Type and Rh: 5100
Unit Type and Rh: 5100
Unit Type and Rh: 5100

## 2020-01-25 LAB — APTT
aPTT: 53 seconds — ABNORMAL HIGH (ref 24–36)
aPTT: 62 seconds — ABNORMAL HIGH (ref 24–36)

## 2020-01-25 LAB — PROTIME-INR
INR: 2.3 — ABNORMAL HIGH (ref 0.8–1.2)
Prothrombin Time: 24.1 seconds — ABNORMAL HIGH (ref 11.4–15.2)

## 2020-01-25 LAB — FIBRINOGEN: Fibrinogen: <60 mg/dL — CL (ref 210–475)

## 2020-01-25 LAB — LACTATE DEHYDROGENASE: LDH: 451 U/L — ABNORMAL HIGH (ref 98–192)

## 2020-01-25 LAB — PREPARE RBC (CROSSMATCH)

## 2020-01-25 MED ORDER — AMIODARONE HCL IN DEXTROSE 360-4.14 MG/200ML-% IV SOLN
60.0000 mg/h | INTRAVENOUS | Status: DC
  Administered 2020-01-25 (×2): 60 mg/h via INTRAVENOUS

## 2020-01-25 MED ORDER — SODIUM CHLORIDE 0.9 % IV SOLN: 100.0000 mg | INTRAVENOUS | Status: DC

## 2020-01-25 MED ORDER — FREE WATER
200.0000 mL | Status: DC
  Administered 2020-01-25 – 2020-01-27 (×13): 200 mL

## 2020-01-25 MED ORDER — MILRINONE LACTATE IN DEXTROSE 20-5 MG/100ML-% IV SOLN
0.1250 ug/kg/min | INTRAVENOUS | Status: DC
  Administered 2020-01-25: 0.125 ug/kg/min via INTRAVENOUS
  Administered 2020-01-25: 0.25 ug/kg/min via INTRAVENOUS
  Administered 2020-01-26 – 2020-02-05 (×15): 0.125 ug/kg/min via INTRAVENOUS
  Filled 2020-01-25 (×17): qty 100

## 2020-01-25 MED ORDER — AMIODARONE HCL IN DEXTROSE 360-4.14 MG/200ML-% IV SOLN
INTRAVENOUS | Status: AC
  Administered 2020-01-25: 150 mg via INTRAVENOUS
  Filled 2020-01-25: qty 200

## 2020-01-25 MED ORDER — FUROSEMIDE 10 MG/ML IJ SOLN
20.0000 mg | Freq: Once | INTRAMUSCULAR | Status: AC
  Administered 2020-01-25: 20 mg via INTRAVENOUS
  Filled 2020-01-25: qty 2

## 2020-01-25 MED ORDER — SODIUM CHLORIDE 0.9% IV SOLUTION: Freq: Once | INTRAVENOUS | Status: AC

## 2020-01-25 MED ORDER — AMIODARONE LOAD VIA INFUSION
150.0000 mg | Freq: Once | INTRAVENOUS | Status: AC
  Administered 2020-01-25: 150 mg via INTRAVENOUS
  Filled 2020-01-25: qty 83.34

## 2020-01-25 MED ORDER — SODIUM CHLORIDE 0.9 % IV SOLN
200.0000 mg | Freq: Once | INTRAVENOUS | Status: DC
  Filled 2020-01-25: qty 200

## 2020-01-25 MED ORDER — VANCOMYCIN HCL 10 G IV SOLR
2500.0000 mg | Freq: Once | INTRAVENOUS | Status: AC
  Administered 2020-01-25: 2500 mg via INTRAVENOUS
  Filled 2020-01-25: qty 2500

## 2020-01-25 MED ORDER — FLUCONAZOLE IN SODIUM CHLORIDE 400-0.9 MG/200ML-% IV SOLN
800.0000 mg | Freq: Once | INTRAVENOUS | Status: AC
  Administered 2020-01-25: 800 mg via INTRAVENOUS
  Filled 2020-01-25: qty 400

## 2020-01-25 MED ORDER — FLUCONAZOLE IN SODIUM CHLORIDE 400-0.9 MG/200ML-% IV SOLN
400.0000 mg | INTRAVENOUS | Status: DC
  Administered 2020-01-26 – 2020-01-27 (×2): 400 mg via INTRAVENOUS
  Filled 2020-01-25 (×3): qty 200

## 2020-01-25 MED ORDER — AMIODARONE LOAD VIA INFUSION
150.0000 mg | Freq: Once | INTRAVENOUS | Status: AC
  Filled 2020-01-25: qty 83.34

## 2020-01-25 MED ORDER — VANCOMYCIN HCL IN DEXTROSE 1-5 GM/200ML-% IV SOLN
1000.0000 mg | Freq: Two times a day (BID) | INTRAVENOUS | Status: DC
  Administered 2020-01-25 – 2020-01-27 (×5): 1000 mg via INTRAVENOUS
  Filled 2020-01-25 (×5): qty 200

## 2020-01-25 MED ORDER — CHLORHEXIDINE GLUCONATE 0.12 % MT SOLN
OROMUCOSAL | Status: AC
  Filled 2020-01-25: qty 15

## 2020-01-25 MED ORDER — VASOPRESSIN 20 UNITS/100 ML INFUSION FOR SHOCK
0.0000 [IU]/min | INTRAVENOUS | Status: DC
  Administered 2020-01-25: 0.03 [IU]/min via INTRAVENOUS
  Administered 2020-01-28 – 2020-02-03 (×15): 0.04 [IU]/min via INTRAVENOUS
  Administered 2020-02-03: 0.02 [IU]/min via INTRAVENOUS
  Filled 2020-01-25 (×20): qty 100

## 2020-01-25 MED ORDER — AMIODARONE HCL IN DEXTROSE 360-4.14 MG/200ML-% IV SOLN
30.0000 mg/h | INTRAVENOUS | Status: DC
  Administered 2020-01-25 – 2020-02-08 (×31): 30 mg/h via INTRAVENOUS
  Filled 2020-01-25 (×9): qty 200
  Filled 2020-01-25: qty 400
  Filled 2020-01-25 (×15): qty 200
  Filled 2020-01-25: qty 400
  Filled 2020-01-25 (×3): qty 200

## 2020-01-25 NOTE — Progress Notes (Signed)
Assisted tele visit to patient with family member.  Beola Vasallo Samson, RN  

## 2020-01-25 NOTE — Procedures (Signed)
Arterial Catheter Insertion Procedure Note  Alisha Stephenson  [id number]  August 12, 1965  Date:01/25/20  Time:9:22 PM    Provider Performing: Corey Harold    Procedure: Insertion of Arterial Line [phone]) with US guidance (07225)   Indication(s) Blood pressure monitoring and/or need for frequent ABGs  Consent Risks of the procedure as well as the alternatives and risks of each were explained to the patient and/or caregiver.  Consent for the procedure was obtained and is signed in the bedside chart  Anesthesia None   Time Out Verified patient identification, verified procedure, site/side was marked, verified correct patient position, special equipment/implants available, medications/allergies/relevant history reviewed, required imaging and test results available.   Sterile Technique Maximal sterile technique including full sterile barrier drape, hand hygiene, sterile gown, sterile gloves, mask, hair covering, sterile ultrasound probe cover (if used).   Procedure Description Area of catheter insertion was cleaned with chlorhexidine and draped in sterile fashion. With real-time ultrasound guidance an arterial catheter was placed into the left radial artery.  Appropriate arterial tracings confirmed on monitor.     Complications/Tolerance None; patient tolerated the procedure well.   EBL Minimal   Specimen(s) None   [name], AGACNP-BC [place] for personal pager PCCM on call pager [phone]  01/25/2020 9:22 PM

## 2020-01-25 NOTE — Progress Notes (Addendum)
Pharmacy Antibiotic Note  Alisha Stephenson is a 54 y.o. female admitted on 01/31/2020 with COVID pneumonia now s/p VV ECMO cannulation on 02/03/2020.  Pharmacy has been consulted for vancomycin, zosyn, and fluconazole  Found to have staph aureus (MSSA) + enterococcus faecalis (pan-sen). Patient has been on high-dose Zosyn given pharmacokinetic properties with ECMO.   Redrawing cultures and broadening abx given worsening clinical picture. WBC wnl, but temperature trending up quickly. Lactate 3.1. CXR worse today. Renal function stable. Pt was supratherapeutic this past weekend on vancomycin 1250mg  IV q12 hrs, so her dose was lowered. Will restart at lower dose.  Plan: Continue high-dose Zosyn 4.5g IV Q6 hrs Restart vancomycin at 1000mg  IV Q12 hrs Start Fluconazole 800mg  IV once, then 400mg  IV Q24 hrs Monitor renal function, cultures/sensitivities, and clinical progression Check vancomycin trough at steady state   Height: 5\' 4"  (162.6 cm) Weight: (!) 147 kg (324 lb 1.2 oz) IBW/kg (Calculated) : 54.7  Temp (24hrs), Avg:97.1 F (36.2 C), Min:95 F (35 C), Max:99.3 F (37.4 C)  Recent Labs  Lab 01/20/20 1641 01/20/20 1653 01/23/20 0436 01/23/20 0858 01/23/20 1700 01/24/20 0400 01/24/20 1712 01/25/20 0410 01/25/20 0957  WBC  --    < > 6.6  --  6.4 7.8 10.0 7.8  --   CREATININE  --    < > 1.13*  --  1.09* 1.08* 1.06* 1.04*  --   LATICACIDVEN  --    < > 2.0* 2.2* 1.6 1.7  --   --  3.1*  VANCOTROUGH 25*  --   --   --   --   --   --   --   --    < > = values in this interval not displayed.    Estimated Creatinine Clearance: 88.4 mL/min (A) (by C-G formula based on SCr of 1.04 mg/dL (H)).    Allergies  Allergen Reactions  . Sulfa Antibiotics Rash and Other (See Comments)    Antimicrobials this admission: Remdesivir 8/2 >> 8/6 Azithromycin 8/3 >> 8/6 CTX 8/3 >> 8/6 Vancomycin 8/12 >> 8/15, 8/19>> Meropenem 8/12 >> 8/13 Zosyn 8/13 >> Fluconazole 8/19 >>  Dose adjustments this  admission: VT 25 - adjust regimen to vanc 1g IV every 12 hours  Microbiology results: 8/19 BCx: 8/15 BCx: neg 8/12 BCx: enterococcus (pan-sens) 8/12 TA: MSSA 8/4 TA: neg 8/4 UCx: neg 8/2 BCx: neg 8/4 MRSA PCR neg 8/2 COVID pos  Richardine Service, PharmD PGY2 Cardiology Pharmacy Resident Phone: [phone] 01/25/2020  2:07 PM  Please check [email or web address] for unit-specific pharmacy phone numbers.

## 2020-01-25 NOTE — Progress Notes (Signed)
Advanced Heart Failure Rounding Note   Subjective:    - 8/2 COVID + test - 8/9 Cannulated for VV ECMO - 8/13 with several areas of intracranial hemorrhage. Bival stopped.  - 8/14 CT no change in [place]. Increased edema - 8/16 Extubated - 8/16 Head CT stable bleed  Will awaken and get agitated. Then gets resedated. Sats down when agitated. Continues to require NE. Sodium climbing to 155. K 5.6  On high-dose zosyn and vanc for enterococcus in blood and MSSA in sputum   CXR worsening diffuse infiltrates Personally reviewed  PLTs 31K -> 38k . Fibrinogen 145 -> 71   ECMO   Flow 5.0L RPM 4100 Sweep6.5 -> 4 -> 8.5 -> 8 -> 9  Labs:  7.52/42/67/96% Hgb8.0 PLT 31 -> 38 -> 43 -> 39 LDH 429 -> 451 PTT => off bival Lactic acid1.6-> 1.5 -> 2.0 -> 1.7 Fibrinogen < 60  Objective:   Weight Range:  Vital Signs:   Temp:  [95 F (35 C)-98.2 F (36.8 C)] 98.2 F (36.8 C) (08/19 0755) Pulse Rate:  [43-113] 97 (08/19 0755) Resp:  [0-25] 7 (08/19 0755) SpO2:  [81 %-100 %] 83 % (08/19 0755) Arterial Line BP: (96-235)/(39-221) 130/51 (08/19 0755) FiO2 (%):  [100 %] 100 % (08/19 0738) Weight:  [147 kg] 147 kg (08/19 0400) Last BM Date: 01/24/20  Weight change: Filed Weights   01/23/20 0500 01/24/20 0500 01/25/20 0400  Weight: 131.8 kg (!) 146.9 kg (!) 147 kg    Intake/Output:   Intake/Output Summary (Last 24 hours) at 01/25/2020 0800 Last data filed at 01/25/2020 0700 Gross per 24 hour  Intake 3950.32 ml  Output 2705 ml  Net 1245.32 ml     Physical Exam: General:  Ill appearing. Tachypneic HEENT: normal Neck: supple. RIJ ECMO cannula. Carotids 2+ bilat; no bruits. No lymphadenopathy or thryomegaly appreciated. Cor: PMI nondisplaced. Regular rate & rhythm. No rubs, gallops or murmurs. Lungs: clear Abdomen: soft, nontender, nondistended. No hepatosplenomegaly. No bruits or masses. Good bowel sounds. Extremities: no cyanosis, clubbing, rash, edema Neuro: alert &  orientedx3, cranial nerves grossly intact. moves all 4 extremities w/o difficulty. Affect pleasant    Telemetry: Sinus 90s Personally reviewed   Labs: Basic Metabolic Panel: Recent Labs  Lab 01/18/20 1700 01/18/20 1953 01/23/20 0436 01/23/20 0850 01/23/20 1700 01/23/20 1709 01/24/20 0400 01/24/20 0518 01/24/20 1712 01/24/20 2044 01/24/20 2346 01/25/20 0410 01/25/20 0421  NA 149*   < > 148*   < > 146*   < > 149*   < > 154* 153* 153* 154* 155*  K 5.4*   < > 4.1   < > 4.0   < > 3.8   < > 5.2* 5.5* 5.5* 4.7 5.6*  CL 113*   < > 106  --  106  --  109  --  113*  --   --  114*  --   CO2 26   < > 31  --  29  --  31  --  29  --   --  30  --   GLUCOSE 336*   < > 286*  --  200*  --  294*  --  256*  --   --  168*  --   BUN 108*   < > 97*  --  113*  --  118*  --  116*  --   --  117*  --   CREATININE 1.19*   < > 1.13*  --  1.09*  --  1.08*  --  1.06*  --   --  1.04*  --   CALCIUM 8.3*   < > 8.9  --  8.7*   < > 9.0  --  9.2  --   --  9.3  --   MG 2.7*  --  2.9*  --   --   --   --   --   --   --   --   --   --    < > = values in this interval not displayed.    Liver Function Tests: Recent Labs  Lab 01/21/20 0355 01/22/20 0235 01/23/20 0436 01/24/20 0400 01/25/20 0410  AST 93* 79* 49* 35 35  ALT 88* 62* 64* 57* 53*  ALKPHOS 167* 132* 133* 124 111  BILITOT 2.5* 2.2* 1.9* 1.9* 1.7*  PROT 5.1* 6.0* 5.5* 5.1* 5.3*  ALBUMIN 2.7* 4.5 3.9 3.4* 3.3*   No results for input(s): LIPASE, AMYLASE in the last 168 hours. No results for input(s): AMMONIA in the last 168 hours.  CBC: Recent Labs  Lab 01/23/20 0436 01/23/20 0850 01/23/20 1700 01/23/20 1709 01/24/20 0400 01/24/20 0518 01/24/20 1712 01/24/20 2044 01/24/20 2346 01/25/20 0410 01/25/20 0421  WBC 6.6  --  6.4  --  7.8  --  10.0  --   --  7.8  --   HGB 8.2*   < > 8.1*   < > 8.3*   < > 9.0* 7.8* 7.8* 8.0* 7.5*  HCT 27.5*   < > 27.6*   < > 28.0*   < > 30.6* 23.0* 23.0* 27.2* 22.0*  MCV 98.9  --  100.4*  --  101.8*  --   102.0*  --   --  102.3*  --   PLT 38*  --  40*  --  42*  --  43*  --   --  39*  --    < > = values in this interval not displayed.    Cardiac Enzymes: No results for input(s): CKTOTAL, CKMB, CKMBINDEX, TROPONINI in the last 168 hours.  BNP: BNP (last 3 results) No results for input(s): BNP in the last 8760 hours.  ProBNP (last 3 results) No results for input(s): PROBNP in the last 8760 hours.    Other results:  Imaging: DG CHEST PORT 1 VIEW  Result Date: 01/25/2020 CLINICAL DATA:  Respiratory failure.  ECMO.  COVID positive. EXAM: PORTABLE CHEST 1 VIEW COMPARISON:  01/24/2020. FINDINGS: Feeding tube, left IJ line, right PICC line, ECMO device in stable position. Complete opacification of both lungs again noted. No interim change. Heart size cannot be accessed due to the complete of opacification of both hemi thoraces. No pneumothorax.  Prior cervical spine fusion. IMPRESSION: 1. Feeding tube, left IJ line, right PICC line, ECMO device in stable position. 2.  Complete opacification of both lungs again noted. Electronically Signed   By: Marcello Moores  Register   On: 01/25/2020 07:10   DG CHEST PORT 1 VIEW  Result Date: 01/24/2020 CLINICAL DATA:  Respiratory failure.  COVID-19 positive EXAM: PORTABLE CHEST 1 VIEW COMPARISON:  January 23, 2020 FINDINGS: ECMO catheter extends along the right medial hemithorax with tip below the diaphragm. Left jugular catheter tip is in the superior vena cava. Feeding tube tip extends below the diaphragm. No pneumothorax evident. Diffuse airspace opacity with probable underlying pleural effusions persists. Diffuse airspace opacity obscures heart borders. No gross cardiomegaly appreciable. There is postoperative change in the lower cervical region. IMPRESSION: Tube and catheter positions  as described without pneumothorax. Diffuse airspace opacity essentially completely obscuring the lung parenchyma bilaterally. There may be underlying pleural effusions. Appearance  similar to 1 day prior. Electronically Signed   By: Lowella Grip III M.D.   On: 01/24/2020 08:20   [place] EKG SITE RITE  Result Date: 01/24/2020 If Site Rite image not attached, placement could not be confirmed due to current cardiac rhythm.    Medications:     Scheduled Medications: . sodium chloride   Intravenous Once  . artificial tears  1 application Both Eyes D7A  . vitamin C  500 mg Per Tube Daily  . chlorhexidine gluconate (MEDLINE KIT)  15 mL Mouth Rinse BID  . Chlorhexidine Gluconate Cloth  6 each Topical Daily  . clonazePAM  1 mg Per Tube Q6H  . docusate  100 mg Per Tube BID  . etomidate  40 mg Intravenous Once  . feeding supplement (PROSource TF)  45 mL Per Tube BID  . fentaNYL (SUBLIMAZE) injection  200 mcg Intravenous Once  . fentaNYL (SUBLIMAZE) injection  50 mcg Intravenous Once  . linagliptin  5 mg Per Tube Daily  . mouth rinse  15 mL Mouth Rinse 10 times per day  . methylPREDNISolone (SOLU-MEDROL) injection  20 mg Intravenous Daily  . midazolam  5 mg Intravenous Once  . nutrition supplement (JUVEN)  1 packet Per Tube BID BM  . oxyCODONE  10 mg Per Tube Q6H  . pantoprazole sodium  40 mg Per Tube Daily  . polyethylene glycol  17 g Per Tube Daily  . QUEtiapine  100 mg Per Tube BID  . sennosides  5 mL Per Tube BID  . sodium chloride flush  10-40 mL Intracatheter Q12H  . vecuronium  10 mg Intravenous Once  . zinc sulfate  220 mg Per Tube Daily    Infusions: . sodium chloride 250 mL (01/21/20 1227)  . sodium chloride Stopped (01/21/2020 1009)  . albumin human 12.5 g (01/24/20 2327)  . dexmedetomidine (PRECEDEX) IV infusion 1.2 mcg/kg/hr (01/25/20 0700)  . feeding supplement (PIVOT 1.5 CAL) 1,000 mL (01/25/20 0748)  . fentaNYL infusion INTRAVENOUS 100 mcg/hr (01/25/20 0700)  . insulin 15 mL/hr at 01/25/20 0700  . labetalol (NORMODYNE) infusion 5 mg/mL Stopped (01/24/20 0505)  . norepinephrine (LEVOPHED) Adult infusion 3 mcg/min (01/25/20 0700)  .  piperacillin-tazobactam (ZOSYN)  IV Stopped (01/25/20 0611)    PRN Medications: sodium chloride, acetaminophen (TYLENOL) oral liquid 160 mg/5 mL, albumin human, albuterol, dextrose, fentaNYL, fentaNYL, guaiFENesin-dextromethorphan, hydrALAZINE, hydrOXYzine, ondansetron **OR** ondansetron (ZOFRAN) IV, sodium chloride flush   Assessment/Plan:   1. Acute hypoxic/hypercapneic respiratory failure in setting of severe COVID PNA/ARDS -> VV ECMO - admit 8/2 - intubation 8/3 - has received actmera (compelted 8/2), remdesivir (completed 8/6) and steroids - failed full vent support with proning/paralytic - Cannulated for VV ECMO on 8/9 - Extubated 8/16 - Continues to struggle. Very tenuous with agitation and worsening hypoxemia. Still on norepinephrine for BP support - CXR with worsening infiltrates.  - Off bival due to [place].  - ECMO circuit with fibrin and some clots but functioning ok off anticoagulation for now. May need to start low-dose heparin soon.  - Prognosis increasingly concerning. May need to consider trach at some point.  2. Enterococcus sepsis - continue vanc + high dose zosyn per pharmacy  - No change  3. Intracranial hemorrhage - ? Septic emboli - repeat head CT on 8/14 with stable bleeds but increased edema - neurology following. Suspect significant long-term injury sustained.  Will follow commands. Appears to have dense LUE weakness and possibly LLE - repeat head CT stable 8/16 - No AC with ICH for now - Will need TEE when platelets up and as condition permits  4. Thrombocytopenia - suspect due to sepsis.  - no obvious bleeding - would not transfuse PLTs unless absolutely necessary given risk of clotting pump - no change  4. Morbid obesity - Body mass index is 49.38 kg/m.  5. Poorly controlled DM2 - HgBA1c 10.7 - CBGs remain elevated - On IV insulin adjust as needed  4. Hypernatremia - trending back up. Restart free water   5. Lactic acidosis -   improved   CRITICAL CARE Performed by: Glori Bickers  Total critical care time: 40 minutes  Critical care time was exclusive of separately billable procedures and treating other patients.  Critical care was necessary to treat or prevent imminent or life-threatening deterioration.  Critical care was time spent personally by me (independent of midlevel providers or residents) on the following activities: development of treatment plan with patient and/or surrogate as well as nursing, discussions with consultants, evaluation of patient's response to treatment, examination of patient, obtaining history from patient or surrogate, ordering and performing treatments and interventions, ordering and review of laboratory studies, ordering and review of radiographic studies, pulse oximetry and re-evaluation of patient's condition.   Length of Stay: 17   Glori Bickers  MD 01/25/2020, 8:00 AM  Advanced Heart Failure Team Pager [phone] (M-F; [place])  Please contact [facility] for night-coverage after hours (4p -7a ) and weekends on [email or web address]

## 2020-01-25 NOTE — Progress Notes (Signed)
Pts sats dropped from 90s  to low 80s after she was turned to the right. Patient was repositioned on her back but oxygen saturations still in the low 80s.

## 2020-01-25 NOTE — Procedures (Signed)
Extracorporeal support note   ECLS support day: 9 days  Indication: COVID 19 pneumonia with ARDS.   Configuration: VV.   Drainage cannula: right IJ 2 stage cannula Return cannula: Right IJ 2 stage cannula  Pump speed: 4100 Pump flow: 5.15 Pump used: Centrimag  Oxygenator: Quadrox O2 blender: 100% Sweep gas: 9  Circuit check: no clot Anticoagulant: off anticoagulation due to ICH Anticoagulation targets: thrombocytopenic  Changes in support: continue current support.  Anticipated goals/duration of support: bridge to recovery. Patient is extubated- wean to decannulation.  Kipp Brood, MD [facility] ICU Physician [place]  Pager: [phone] Mobile: [phone] After hours: [phone].  01/25/2020, 10:56 AM

## 2020-01-25 NOTE — Progress Notes (Signed)
Spoke with PharmD re: BC/Lab, explained that per leadership management, lab will be drawn unless starting PIV on patient. Patient has TL PICC, working well.

## 2020-01-25 NOTE — Progress Notes (Addendum)
STROKE TEAM PROGRESS NOTE   INTERVAL HISTORY Patient remains critically ill with respiratory failure needing ECMO.She remains extubated but on high flow oxygen via nonrebreather and ECMO.  Neurologically she is arousable and remains on sedation with fentanyl which was just discontinued earlier this morning and according to RN she follows simple midline commands   but presently patient appears to be heavily sedated and not responsive to my exam.  She had brief episode of hypotension which responded to norepinephrine. OBJECTIVE Vitals:   01/25/20 1000 01/25/20 1015 01/25/20 1100 01/25/20 1113  BP:    (!) 130/57  Pulse: (!) 115 (!) 116 (!) 117 (!) 122  Resp: 18 20 (!) 7 18  Temp: 99.1 F (37.3 C) 99.1 F (37.3 C) 99.1 F (37.3 C) 99.1 F (37.3 C)  TempSrc:      SpO2: (!) 80% (!) 76% (!) 82% (!) 85%  Weight:      Height:       CBC:  Recent Labs  Lab 01/24/20 1712 01/24/20 2044 01/25/20 0410 01/25/20 0421 01/25/20 0834 01/25/20 1134  WBC 10.0  --  7.8  --   --   --   HGB 9.0*   < > 8.0*   < > 8.2* 8.8*  HCT 30.6*   < > 27.2*   < > 24.0* 26.0*  MCV 102.0*  --  102.3*  --   --   --   PLT 43*  --  39*  --   --   --    < > = values in this interval not displayed.   Basic Metabolic Panel:  Recent Labs  Lab 01/18/20 1700 01/18/20 1953 01/23/20 0436 01/23/20 0850 01/24/20 1712 01/24/20 2044 01/25/20 0410 01/25/20 0421 01/25/20 0834 01/25/20 1134  NA 149*   < > 148*   < > 154*   < > 154*   < > 156* 155*  K 5.4*   < > 4.1   < > 5.2*   < > 4.7   < > 4.5 4.9  CL 113*   < > 106   < > 113*  --  114*  --   --   --   CO2 26   < > 31   < > 29  --  30  --   --   --   GLUCOSE 336*   < > 286*   < > 256*  --  168*  --   --   --   BUN 108*   < > 97*   < > 116*  --  117*  --   --   --   CREATININE 1.19*   < > 1.13*   < > 1.06*  --  1.04*  --   --   --   CALCIUM 8.3*   < > 8.9   < > 9.2  --  9.3  --   --   --   MG 2.7*  --  2.9*  --   --   --   --   --   --   --    < > = values in this  interval not displayed.   Lipid Panel:     Component Value Date/Time   CHOL 177 09/04/2019 1522   TRIG 341 (H) 01/10/2020 0346   HDL 32.80 (L) 09/04/2019 1522   CHOLHDL 5 09/04/2019 1522   VLDL 55.4 (H) 09/04/2019 1522   LDLCALC 79 07/05/2017 0908   HgbA1c:  Lab  Results  Component Value Date   HGBA1C 10.7 (H) 01/14/2020   Urine Drug Screen: No results found for: LABOPIA, COCAINSCRNUR, LABBENZ, AMPHETMU, THCU, LABBARB  Alcohol Level No results found for: [place] 01/19/2020 Multifocal parenchymal hemorrhages, with the largest within the right parietal lobe. Together with associated edema, there is mild regional mass effect but no herniation. Considerations include sequelae of ECMO anticoagulation, hemorrhagic conversion of embolic infarcts, and vasculitis/vasculopathy.   CT HEAD WO CONTRAST 01/20/2020 No substantial change in multifocal parenchymal hemorrhages. Regional mass effect remains mild. No new hemorrhage.   CT HEAD WO CONTRAST 01/22/2020 Continued stability with no substantial change in multifocal parenchymal hemorrhages and associated mild regional mass effect. No new hemorrhage   CT CHEST ABDOMEN PELVIS W CONTRAST 01/19/2020 1. Dense airspace opacity throughout both lungs with air bronchograms, compatible with the clinical diagnosis of COVID ARDS. Dense bilateral pneumonia could also have this appearance.  2. Small to moderate-sized right pleural effusion and small left pleural effusion.  3. Small amount of ascites.  4. Colonic diverticulosis.   Transthoracic Echocardiogram  01/16/2020 1. Left ventricular ejection fraction, by estimation, is >75%. The left ventricle has hyperdynamic function. The left ventricle has no regional wall motion abnormalities. Left ventricular diastolic function could not be evaluated.  2. Right ventricular systolic function is hyperdynamic. The right ventricular size is normal. Tricuspid regurgitation signal is  inadequate for assessing PA pressure.  3. The mitral valve is grossly normal. No evidence of mitral valve regurgitation.  4. The aortic valve was not well visualized. Aortic valve regurgitation is not visualized.  5. The inferior vena cava is normal in size with <50% respiratory variability, suggesting right atrial pressure of 8 mmHg.   ECG - ST rate 132 BPM. (See cardiology reading for complete details)  PHYSICAL EXAM    Obese middle-aged Caucasian lady who is  sedated. Afebrile. Head is nontraumatic. Neck is supple without bruit.    Cardiac exam no murmur or gallop. Lungs are clear to auscultation. Distal pulses are well felt. Neurological Exam : Patient is i sedated..She is unresponsive.  She barely opens eyes to verbal stimuli.  She does not follow commands.  Eyes are slightly dysconjugate with left eyes slight hypertropia.  She follows gaze in all directions with slight limitation of upgaze.  She blinks to threat on both sides pupils 3 mm sluggishly reactive.    Weak cough and gag.  No spontaneous extremity movements and barely any withdrawal to painful stimuli in all 4 extremities.   Plantars are equivocal bilaterally.   ASSESSMENT/PLAN Alisha Stephenson is a 54 y.o. female with history of diabetes and morbid obesity admitted for Covid pneumonia.  She developed encephalopathy and a CT of the head revealed multifocal ICHs with largest within the right frontoparietal area.  She did not receive IV t-PA due to [place].  Multifocal parenchymal hemorrhages.  Resultant mild left hemiparesis  CT head - 01/20/20 - multifocal parenchymal hemorrhages.   2D Echo - EF > 75 %. No cardiac source of emboli identified.   Sars Corona Virus 2 - positive  LDL - triglycerides 341  HgbA1c - 10.7  VTE prophylaxis - SCDs Diet  Diet Order            Diet NPO time specified  Diet effective midnight                 No antithrombotic prior to admission, now on No antithrombotic due to  intracerebral hemorrhage  and low platelet count  Therapy recommendations:  pending  Disposition:  Pending  Hypertension  Home BP meds: none   Current BP meds: Levophed  Unstable . Long-term BP goal normotensive  Hyperlipidemia  Home Lipid lowering medication: none   LDL triglycerides 341, goal < 70  Current lipid lowering medication:  None (statin contraindicated with ICH)  Continue statin at discharge  Diabetes  Home diabetic meds: insulin  Current diabetic meds: Tradjenta and insulin  HgbA1c 10.7, goal < 7.0 Recent Labs    01/25/20 0745 01/25/20 0914 01/25/20 1243  GLUCAP 242* 210* 171*    Other Stroke Risk Factors  Former cigarette smoker - quit  Obesity, Body mass index is 55.63 kg/m., recommend weight loss, diet and exercise as appropriate   Other Active Problems  Code status - Limited code   Palliative Care Consult - 01/20/20 -> Family is not ready to transition to comfort care at this time.   Summary of Recommendations From Palliative Care:  No CPR/ACLS in the event of cardiac arrest   Family will need ongoing support   In the event of rapid decline, please allow for 3 visitors at bedside (per visitation guidelines)  Palliative Care will follow-up with family  Sunday  Anemia - Hgb - 9.9->10.2  Multi organ system failure  AKI - creatinine - 1.21->1.22  Intubated / ECMO  Bradycardia   Hospital day # 17 Patient has developed multiple intracerebral hemorrhages in the setting of severe Covid infection, enterococcal sepsis and usage of anticoagulation for ECMO.  Exact etiology of the hemorrhage is unclear but likely due to combination of the above.  Her neurological prognosis is guarded but she is likely to have some residual cognitive deficits from this but her primary prognosis is likely driven by her severe Covid infection and refractory hypoxia requiring ECMO.   patient has now been off anticoagulation for 4-5 days and perhaps ECMO can be  managed without it.  Long discussion with Dr. Lynetta Mare critical care medicine and answered questions.  I am available to talk to family if needed.  Stroke team will sign off.  Kindly call for questions.This patient is critically ill and at significant risk of neurological worsening, death and care requires constant monitoring of vital signs, hemodynamics,respiratory and cardiac monitoring, extensive review of multiple databases, frequent neurological assessment, discussion with family, other specialists and medical decision making of high complexity.I have made any additions or clarifications directly to the above note.This critical care time does not reflect procedure time, or teaching time or supervisory time of PA/NP/Med Resident etc but could involve care discussion time.  I spent 30 minutes of neurocritical care time  in the care of  this patient.        Alisha Contras, MD     To contact Stroke Continuity provider, please refer to [email or web address] After hours, contact General Neurology

## 2020-01-25 NOTE — Progress Notes (Signed)
ECMO PROGRESS NOTE  NAME:  Alisha Stephenson, MRN:  [id number], DOB:  05-30-1966, LOS: 74 ADMISSION DATE:  01/13/2020, ECMO CONSULTATION DATE:  8/9 REFERRING MD:  Ruthann Cancer - PCCM, CHIEF COMPLAINT:  Hypoxic respiratory failure.    HPI/course in hospital  54 y/o female admitted on 8/2 with severe acute respiratory failure with hypoxemia due to COVID 19 pneumonia. She developed symptoms 1 week prior to admission.  8/2 admit 8/3 ICU transfer, intubated 8/4 prone, paralyze 8/9 significant desaturations today 8/9 VV ECMO cannulation 8/13 head bleed - multifocal ICH without mass effect.  8/16 extubated.   Past Medical History   Past Medical History:  Diagnosis Date  . Asthma   . Diabetes mellitus without complication ([place])   . Diverticulitis   . Gallstones   . IBS (irritable bowel syndrome)   . NAFLD (nonalcoholic fatty liver disease)    CT scan 2015  . Ovarian cyst      Past Surgical History:  Procedure Laterality Date  . ABDOMINAL HYSTERECTOMY    . CESAREAN SECTION    . CHOLECYSTECTOMY    . COLONOSCOPY     about 2011  . ECMO CANNULATION N/A 01/31/2020   Procedure: ECMO CANNULATION;  Surgeon: Jolaine Artist, MD;  Location: [facility];  Service: Cardiovascular;  Laterality: N/A;      Interim history/subjective:   Saturations marginal on high flow O2. IV sedation is off.  Episode of hypotension, responded to NE.   Objective   Blood pressure (!) 157/59, pulse (!) 116, temperature 99.1 F (37.3 C), resp. rate 20, height 5\' 4"  (1.626 m), weight (!) 147 kg, SpO2 (!) 76 %. CVP:  [11 mmHg-18 mmHg] 18 mmHg  FiO2 (%):  [100 %] 100 %   Intake/Output Summary (Last 24 hours) at 01/25/2020 1058 Last data filed at 01/25/2020 1000 Gross per 24 hour  Intake 3818.95 ml  Output 2755 ml  Net 1063.95 ml   Filed Weights   01/23/20 0500 01/24/20 0500 01/25/20 0400  Weight: 131.8 kg (!) 146.9 kg (!) 147 kg    ECMO Device: Centrimag  ECMO Mode: VV  Flow (LPM): 5.17    Examination: Physical Exam Constitutional:      Appearance: She is obese. She is ill-appearing.  HENT:     Head: Normocephalic.  Eyes:     General: Scleral icterus present.  Cardiovascular:     Rate and Rhythm: Normal rate and regular rhythm.     Heart sounds: Normal heart sounds.  Pulmonary:     Comments: Bilaterally absent breath sounds.  Abdominal:     General: Abdomen is protuberant.  Neurological:     Comments: Not following commands.    POC echo Difficult study. RV large with moderate dysfunction. LV appears normal.    Ancillary tests (personally reviewed)  CBC: Recent Labs  Lab 01/23/20 0436 01/23/20 0850 01/23/20 1700 01/23/20 1709 01/24/20 0400 01/24/20 0518 01/24/20 1712 01/24/20 2044 01/24/20 2346 01/25/20 0410 01/25/20 0421  WBC 6.6  --  6.4  --  7.8  --  10.0  --   --  7.8  --   HGB 8.2*   < > 8.1*   < > 8.3*   < > 9.0* 7.8* 7.8* 8.0* 7.5*  HCT 27.5*   < > 27.6*   < > 28.0*   < > 30.6* 23.0* 23.0* 27.2* 22.0*  MCV 98.9  --  100.4*  --  101.8*  --  102.0*  --   --  102.3*  --   PLT 38*  --  40*  --  42*  --  43*  --   --  39*  --    < > = values in this interval not displayed.    Basic Metabolic Panel: Recent Labs  Lab 01/18/20 1700 01/18/20 1953 01/23/20 0436 01/23/20 0850 01/23/20 1700 01/23/20 1709 01/24/20 0400 01/24/20 0518 01/24/20 1712 01/24/20 2044 01/24/20 2346 01/25/20 0410 01/25/20 0421  NA 149*   < > 148*   < > 146*   < > 149*   < > 154* 153* 153* 154* 155*  K 5.4*   < > 4.1   < > 4.0   < > 3.8   < > 5.2* 5.5* 5.5* 4.7 5.6*  CL 113*   < > 106  --  106  --  109  --  113*  --   --  114*  --   CO2 26   < > 31  --  29  --  31  --  29  --   --  30  --   GLUCOSE 336*   < > 286*  --  200*  --  294*  --  256*  --   --  168*  --   BUN 108*   < > 97*  --  113*  --  118*  --  116*  --   --  117*  --   CREATININE 1.19*   < > 1.13*  --  1.09*  --  1.08*  --  1.06*  --   --  1.04*  --   CALCIUM 8.3*   < > 8.9  --  8.7*  --  9.0  --  9.2   --   --  9.3  --   MG 2.7*  --  2.9*  --   --   --   --   --   --   --   --   --   --    < > = values in this interval not displayed.   GFR: Estimated Creatinine Clearance: 88.4 mL/min (A) (by C-G formula based on SCr of 1.04 mg/dL (H)). Recent Labs  Lab 01/23/20 0436 01/23/20 0436 01/23/20 0858 01/23/20 1700 01/24/20 0400 01/24/20 1712 01/25/20 0410  WBC 6.6   < >  --  6.4 7.8 10.0 7.8  LATICACIDVEN 2.0*  --  2.2* 1.6 1.7  --   --    < > = values in this interval not displayed.    Liver Function Tests: Recent Labs  Lab 01/21/20 0355 01/22/20 0235 01/23/20 0436 01/24/20 0400 01/25/20 0410  AST 93* 79* 49* 35 35  ALT 88* 62* 64* 57* 53*  ALKPHOS 167* 132* 133* 124 111  BILITOT 2.5* 2.2* 1.9* 1.9* 1.7*  PROT 5.1* 6.0* 5.5* 5.1* 5.3*  ALBUMIN 2.7* 4.5 3.9 3.4* 3.3*   No results for input(s): LIPASE, AMYLASE in the last 168 hours. No results for input(s): AMMONIA in the last 168 hours.  ABG    Component Value Date/Time   PHART 7.521 (H) 01/25/2020 0421   PCO2ART 42.1 01/25/2020 0421   PO2ART 67 (L) 01/25/2020 0421   HCO3 34.9 (H) 01/25/2020 0421   TCO2 36 (H) 01/25/2020 0421   ACIDBASEDEF 2.0 01/11/2020 1203   O2SAT 96.0 01/25/2020 0421     Coagulation Profile: Recent Labs  Lab 01/22/20 0235 01/22/20 0923 01/23/20 0436 01/24/20 0400  01/25/20 0410  INR 1.9* 2.0* 1.5* 1.7* 2.3*    Cardiac Enzymes: No results for input(s): CKTOTAL, CKMB, CKMBINDEX, TROPONINI in the last 168 hours.  HbA1C: Hgb A1c MFr Bld  Date/Time Value Ref Range Status  01/13/2020 03:56 AM 10.7 (H) 4.8 - 5.6 % Final    Comment:    (NOTE) Pre diabetes:          5.7%-6.4%  Diabetes:              >6.4%  Glycemic control for   <7.0% adults with diabetes   09/04/2019 03:22 PM 9.9 (H) 4.6 - 6.5 % Final    Comment:    Glycemic Control Guidelines for People with Diabetes:Non Diabetic:  <6%Goal of Therapy: <7%Additional Action Suggested:  >8%     CBG: Recent Labs  Lab  01/25/20 0233 01/25/20 0420 01/25/20 0550 01/25/20 0745 01/25/20 0914  GLUCAP 151* 161* 252* 242* 210*     Assessment & Plan:   Critically ill due to acute hypoxic respiratory failure requiring HHFNC  and VV ECMO support.  Critically ill due to shock from presumed RV failure requiring initiation of milrinone. ICH  Enterococcal bacteremia Acute toxic encephalopathy Thrombocytopenia.  DM type 2 Obesity  Plan:   Continue allow iv sedation to wear off.  Continue extubated ECMO - lungs are not contributing to gas exchange. Maintain higher transfusion threshold.  Initiated milrinone for RV support.  Resume low dose anticoagulation as increasing fibrinogen deposition in circuit.  Continue Zosyn and add vancomycin as hypotension could be due to sepsis.   Daily Goals Checklist  Pain/Anxiety/Delirium protocol (if indicated): trial on enteral sedation only VAP protocol (if indicated): not intubated.  Respiratory support goals:  Continue Current support.  Blood pressure target: initiate milrinone and titrate NE down to keep MAP >65. If needs ongoing vasopressor support, vasopressin may be better from pulmonary hypertension standpoint.  DVT prophylaxis: restart low dose heparin infusion.  Nutritional status and feeding goals: Tolerating tube feeds GI prophylaxis: Pantoprazole Fluid status goals: gentle diuresis.  Urinary catheter: Assessment of intravascular volume and Guide hemodynamic management Central lines: right radial arterial line, Left IJ TLC, 3L PICC Glucose control: Remains hyperglycemic on IV insulin Mobility/therapy needs: Bedrest.  Antibiotic de-escalation: Zosyn and Vancomycin. Home medication reconciliation: on hold Daily labs: Per ECMO protocol Code Status: No CPR in case of cardiac arrest. Family Communication: Will update family Disposition: ICU.   Critical care time: 78 min    Kipp Brood, MD [facility] ICU Physician [place]  Pager:  [phone] Mobile: [phone] After hours: [phone].  01/25/2020, 10:58 AM

## 2020-01-26 ENCOUNTER — Inpatient Hospital Stay (HOSPITAL_COMMUNITY): Payer: PRIVATE HEALTH INSURANCE

## 2020-01-26 LAB — BASIC METABOLIC PANEL
Anion gap: 11 (ref 5–15)
Anion gap: 16 — ABNORMAL HIGH (ref 5–15)
BUN: 154 mg/dL — ABNORMAL HIGH (ref 6–20)
BUN: 170 mg/dL — ABNORMAL HIGH (ref 6–20)
CO2: 25 mmol/L (ref 22–32)
CO2: 23 mmol/L (ref 22–32)
Calcium: 8.9 mg/dL (ref 8.9–10.3)
Calcium: 9.1 mg/dL (ref 8.9–10.3)
Chloride: 113 mmol/L — ABNORMAL HIGH (ref 98–111)
Chloride: 113 mmol/L — ABNORMAL HIGH (ref 98–111)
Creatinine, Ser: 1.64 mg/dL — ABNORMAL HIGH (ref 0.44–1.00)
Creatinine, Ser: 1.87 mg/dL — ABNORMAL HIGH (ref 0.44–1.00)
GFR calc Af Amer: 40 mL/min — ABNORMAL LOW (ref >60)
GFR calc Af Amer: 34 mL/min — ABNORMAL LOW (ref >60)
GFR calc non Af Amer: 35 mL/min — ABNORMAL LOW (ref >60)
GFR calc non Af Amer: 30 mL/min — ABNORMAL LOW (ref >60)
Glucose, Bld: 188 mg/dL — ABNORMAL HIGH (ref 70–99)
Glucose, Bld: 340 mg/dL — ABNORMAL HIGH (ref 70–99)
Potassium: 5.1 mmol/L (ref 3.5–5.1)
Potassium: 4.8 mmol/L (ref 3.5–5.1)
Sodium: 149 mmol/L — ABNORMAL HIGH (ref 135–145)
Sodium: 152 mmol/L — ABNORMAL HIGH (ref 135–145)

## 2020-01-26 LAB — POCT I-STAT 7, (LYTES, BLD GAS, ICA,H+H)
Acid-Base Excess: 1 mmol/L (ref 0.0–2.0)
Acid-Base Excess: 1 mmol/L (ref 0.0–2.0)
Acid-Base Excess: 2 mmol/L (ref 0.0–2.0)
Acid-Base Excess: 0 mmol/L (ref 0.0–2.0)
Acid-base deficit: 2 mmol/L (ref 0.0–2.0)
Bicarbonate: 27.1 mmol/L (ref 20.0–28.0)
Bicarbonate: 26.9 mmol/L (ref 20.0–28.0)
Bicarbonate: 27.3 mmol/L (ref 20.0–28.0)
Bicarbonate: 24 mmol/L (ref 20.0–28.0)
Bicarbonate: 25.6 mmol/L (ref 20.0–28.0)
Calcium, Ion: 1.28 mmol/L (ref 1.15–1.40)
Calcium, Ion: 1.27 mmol/L (ref 1.15–1.40)
Calcium, Ion: 1.28 mmol/L (ref 1.15–1.40)
Calcium, Ion: 1.26 mmol/L (ref 1.15–1.40)
Calcium, Ion: 1.27 mmol/L (ref 1.15–1.40)
HCT: 24 % — ABNORMAL LOW (ref 36.0–46.0)
HCT: 25 % — ABNORMAL LOW (ref 36.0–46.0)
HCT: 24 % — ABNORMAL LOW (ref 36.0–46.0)
HCT: 25 % — ABNORMAL LOW (ref 36.0–46.0)
HCT: 26 % — ABNORMAL LOW (ref 36.0–46.0)
Hemoglobin: 8.2 g/dL — ABNORMAL LOW (ref 12.0–15.0)
Hemoglobin: 8.5 g/dL — ABNORMAL LOW (ref 12.0–15.0)
Hemoglobin: 8.2 g/dL — ABNORMAL LOW (ref 12.0–15.0)
Hemoglobin: 8.5 g/dL — ABNORMAL LOW (ref 12.0–15.0)
Hemoglobin: 8.8 g/dL — ABNORMAL LOW (ref 12.0–15.0)
O2 Saturation: 86 %
O2 Saturation: 83 %
O2 Saturation: 89 %
O2 Saturation: 84 %
O2 Saturation: 81 %
Patient temperature: 36.6
Patient temperature: 36
Patient temperature: 35.8
Patient temperature: 35.6
Patient temperature: 35.5
Potassium: 5.1 mmol/L (ref 3.5–5.1)
Potassium: 5 mmol/L (ref 3.5–5.1)
Potassium: 4.5 mmol/L (ref 3.5–5.1)
Potassium: 4.6 mmol/L (ref 3.5–5.1)
Potassium: 4.7 mmol/L (ref 3.5–5.1)
Sodium: 151 mmol/L — ABNORMAL HIGH (ref 135–145)
Sodium: 152 mmol/L — ABNORMAL HIGH (ref 135–145)
Sodium: 151 mmol/L — ABNORMAL HIGH (ref 135–145)
Sodium: 150 mmol/L — ABNORMAL HIGH (ref 135–145)
Sodium: 151 mmol/L — ABNORMAL HIGH (ref 135–145)
TCO2: 29 mmol/L (ref 22–32)
TCO2: 28 mmol/L (ref 22–32)
TCO2: 29 mmol/L (ref 22–32)
TCO2: 25 mmol/L (ref 22–32)
TCO2: 27 mmol/L (ref 22–32)
pCO2 arterial: 49.5 mmHg — ABNORMAL HIGH (ref 32.0–48.0)
pCO2 arterial: 47.1 mmHg (ref 32.0–48.0)
pCO2 arterial: 41.3 mmHg (ref 32.0–48.0)
pCO2 arterial: 43 mmHg (ref 32.0–48.0)
pCO2 arterial: 42.5 mmHg (ref 32.0–48.0)
pH, Arterial: 7.344 — ABNORMAL LOW (ref 7.350–7.450)
pH, Arterial: 7.361 (ref 7.350–7.450)
pH, Arterial: 7.423 (ref 7.350–7.450)
pH, Arterial: 7.349 — ABNORMAL LOW (ref 7.350–7.450)
pH, Arterial: 7.381 (ref 7.350–7.450)
pO2, Arterial: 55 mmHg — ABNORMAL LOW (ref 83.0–108.0)
pO2, Arterial: 47 mmHg — ABNORMAL LOW (ref 83.0–108.0)
pO2, Arterial: 52 mmHg — ABNORMAL LOW (ref 83.0–108.0)
pO2, Arterial: 47 mmHg — ABNORMAL LOW (ref 83.0–108.0)
pO2, Arterial: 43 mmHg — ABNORMAL LOW (ref 83.0–108.0)

## 2020-01-26 LAB — BPAM RBC
Blood Product Expiration Date: 202109122359
Blood Product Expiration Date: 202109122359
Blood Product Expiration Date: 202109122359
Blood Product Expiration Date: 202109122359
ISSUE DATE / TIME: 202108191952
Unit Type and Rh: 5100
Unit Type and Rh: 5100
Unit Type and Rh: 5100
Unit Type and Rh: 5100

## 2020-01-26 LAB — CULTURE, BLOOD (ROUTINE X 2)
Culture: NO GROWTH
Culture: NO GROWTH
Special Requests: ADEQUATE
Special Requests: ADEQUATE

## 2020-01-26 LAB — TYPE AND SCREEN
ABO/RH(D): O POS
Antibody Screen: NEGATIVE
Unit division: 0
Unit division: 0
Unit division: 0
Unit division: 0

## 2020-01-26 LAB — GLUCOSE, CAPILLARY
Glucose-Capillary: 123 mg/dL — ABNORMAL HIGH (ref 70–99)
Glucose-Capillary: 150 mg/dL — ABNORMAL HIGH (ref 70–99)
Glucose-Capillary: 185 mg/dL — ABNORMAL HIGH (ref 70–99)
Glucose-Capillary: 190 mg/dL — ABNORMAL HIGH (ref 70–99)
Glucose-Capillary: 231 mg/dL — ABNORMAL HIGH (ref 70–99)
Glucose-Capillary: 294 mg/dL — ABNORMAL HIGH (ref 70–99)
Glucose-Capillary: 315 mg/dL — ABNORMAL HIGH (ref 70–99)
Glucose-Capillary: 347 mg/dL — ABNORMAL HIGH (ref 70–99)
Glucose-Capillary: 360 mg/dL — ABNORMAL HIGH (ref 70–99)

## 2020-01-26 LAB — LACTATE DEHYDROGENASE: LDH: 576 U/L — ABNORMAL HIGH (ref 98–192)

## 2020-01-26 LAB — APTT
aPTT: 51 seconds — ABNORMAL HIGH (ref 24–36)
aPTT: 43 seconds — ABNORMAL HIGH (ref 24–36)

## 2020-01-26 LAB — CBC
HCT: 29.3 % — ABNORMAL LOW (ref 36.0–46.0)
HCT: 29.2 % — ABNORMAL LOW (ref 36.0–46.0)
Hemoglobin: 8.7 g/dL — ABNORMAL LOW (ref 12.0–15.0)
Hemoglobin: 8.5 g/dL — ABNORMAL LOW (ref 12.0–15.0)
MCH: 30.4 pg (ref 26.0–34.0)
MCH: 29.7 pg (ref 26.0–34.0)
MCHC: 29.7 g/dL — ABNORMAL LOW (ref 30.0–36.0)
MCHC: 29.1 g/dL — ABNORMAL LOW (ref 30.0–36.0)
MCV: 102.4 fL — ABNORMAL HIGH (ref 80.0–100.0)
MCV: 102.1 fL — ABNORMAL HIGH (ref 80.0–100.0)
Platelets: 33 10*3/uL — ABNORMAL LOW (ref 150–400)
Platelets: 34 10*3/uL — ABNORMAL LOW (ref 150–400)
RBC: 2.86 MIL/uL — ABNORMAL LOW (ref 3.87–5.11)
RBC: 2.86 MIL/uL — ABNORMAL LOW (ref 3.87–5.11)
RDW: 22 % — ABNORMAL HIGH (ref 11.5–15.5)
RDW: 22.5 % — ABNORMAL HIGH (ref 11.5–15.5)
WBC: 12.4 10*3/uL — ABNORMAL HIGH (ref 4.0–10.5)
WBC: 13.3 10*3/uL — ABNORMAL HIGH (ref 4.0–10.5)
nRBC: 6.2 % — ABNORMAL HIGH (ref 0.0–0.2)
nRBC: 4.4 % — ABNORMAL HIGH (ref 0.0–0.2)

## 2020-01-26 LAB — LACTIC ACID, PLASMA
Lactic Acid, Venous: 2.8 mmol/L (ref 0.5–1.9)
Lactic Acid, Venous: 1.4 mmol/L (ref 0.5–1.9)

## 2020-01-26 LAB — HEPATIC FUNCTION PANEL
ALT: 61 U/L — ABNORMAL HIGH (ref 0–44)
AST: 52 U/L — ABNORMAL HIGH (ref 15–41)
Albumin: 3.1 g/dL — ABNORMAL LOW (ref 3.5–5.0)
Alkaline Phosphatase: 151 U/L — ABNORMAL HIGH (ref 38–126)
Bilirubin, Direct: 0.5 mg/dL — ABNORMAL HIGH (ref 0.0–0.2)
Indirect Bilirubin: 1.2 mg/dL — ABNORMAL HIGH (ref 0.3–0.9)
Total Bilirubin: 1.7 mg/dL — ABNORMAL HIGH (ref 0.3–1.2)
Total Protein: 5.2 g/dL — ABNORMAL LOW (ref 6.5–8.1)

## 2020-01-26 LAB — FIBRINOGEN: Fibrinogen: <60 mg/dL — CL (ref 210–475)

## 2020-01-26 LAB — PROTIME-INR
INR: 2.5 — ABNORMAL HIGH (ref 0.8–1.2)
Prothrombin Time: 25.8 seconds — ABNORMAL HIGH (ref 11.4–15.2)

## 2020-01-26 MED ORDER — FUROSEMIDE 10 MG/ML IJ SOLN
40.0000 mg | Freq: Once | INTRAMUSCULAR | Status: AC
  Administered 2020-01-26: 40 mg via INTRAVENOUS

## 2020-01-26 NOTE — Progress Notes (Addendum)
Physical Therapy Treatment Patient Details Name: Alisha Stephenson MRN: [id number] DOB: Sep 11, 1965 Today's Date: 01/26/2020    History of Present Illness 54 yo admitted 8/2 with hypoxemia due to Covid 19 PNA. 8/3 intubated. 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  PMHx: DM, diverticulitis, Asthma    PT Comments    Pt admitted with above diagnosis. PT/OT co-treat with pt in Kreg tilt bed.  Pt did appear to look at PT/OT on arrival. Pt did not appear to have movement in left UE or LE but did move right UE and LE to command with 10-12 second delay.  Pt also appeared to look at photos of grandkids.  Pt shook head no inconsistently and mouthed a word or 2 but could not tell what pt was saying.  Nurse came in and spent last several minutes of the session positioning pt in chair position in bed.  Pt in good alignment sitting upright with pillows under UEs for edema control.   Pt currently with functional limitations due to balance and endurance deficits as well as limited by lines.  Pt will benefit from skilled PT to increase their independence and safety with mobility to allow discharge to the venue listed below.   HR 130-143 bpm SpO2 85 initially and down to 71 during session.  BP 116/51 initially BP - art line not accurate position* at end of treatment  Follow Up Recommendations  Other (comment) (TBD with progression)     Equipment Recommendations  Other (comment) (TBD)    Recommendations for Other Services       Precautions / Restrictions Precautions Precautions: Other (comment) Precaution Comments: ECMO with anchors at right shoulder and chest Restrictions Weight Bearing Restrictions: No    Mobility  Bed Mobility               General bed mobility comments: unable to mobilize at this time due to lethargy and ECMO  Transfers                    Ambulation/Gait                 Stairs              Wheelchair Mobility    Modified Rankin (Stroke Patients Only)       Balance Overall balance assessment: Needs assistance     Sitting balance - Comments: unable but did incline the head of bed to 68 degrees in chair position and pt was able to push right foot against the end of the bed.                                     Cognition Arousal/Alertness: Lethargic;Suspect due to medications Behavior During Therapy: Flat affect Overall Cognitive Status: Difficult to assess                                 General Comments: Moved UE and LE to command with up to 10-12 ssecond delayed response time. Appeared to look at photos of family, shook head no a couple times inconsistently, and appeared to look at PT/OT to command at times.  Pt also mouthed something to PT but could not tell what pt was saying.       Exercises General Exercises - Lower Extremity Ankle Circles/Pumps: Both;Supine;AAROM;PROM;10 reps  Quad Sets: AAROM;PROM;Both;5 reps;Supine Gluteal Sets: AAROM;Both;5 reps;Supine Heel Slides: PROM;Both;5 reps;Supine Other Exercises Other Exercises: Pt was able to push her right LE against end of bed and against therapists hand.  Also possibly got pt to do glut sets bilaterally.  See OT note for UE exercises pt participated in. Did not respond to nail bed pressure.    General Comments General comments (skin integrity, edema, etc.): Edema all 4 extremities      Pertinent Vitals/Pain Pain Assessment: No/denies pain    Home Living                      Prior Function            PT Goals (current goals can now be found in the care plan section) Acute Rehab PT Goals Patient Stated Goal: return to walking and moving Progress towards PT goals: Not progressing toward goals - comment (still not following commands much)    Frequency    Min 2X/week      PT Plan Current plan remains appropriate    Co-evaluation PT/OT/SLP  Co-Evaluation/Treatment: Yes Reason for Co-Treatment: For patient/therapist safety;Complexity of the patient's impairments (multi-system involvement) PT goals addressed during session: Mobility/safety with mobility        AM-PAC PT "6 Clicks" Mobility   Outcome Measure  Help needed turning from your back to your side while in a flat bed without using bedrails?: Total Help needed moving from lying on your back to sitting on the side of a flat bed without using bedrails?: Total Help needed moving to and from a bed to a chair (including a wheelchair)?: Total Help needed standing up from a chair using your arms (e.g., wheelchair or bedside chair)?: Total Help needed to walk in hospital room?: Total Help needed climbing 3-5 steps with a railing? : Total 6 Click Score: 6    End of Session Equipment Utilized During Treatment: Oxygen (30L 100% FiO2 - heated HFNC) Activity Tolerance: Patient tolerated treatment well;Patient limited by fatigue Patient left: in bed;with nursing/sitter in room;with call bell/phone within reach;with SCD's reapplied Nurse Communication: Mobility status;Need for lift equipment PT Visit Diagnosis: Other abnormalities of gait and mobility (R26.89);Muscle weakness (generalized) (M62.81)     Time: 1505-6979 PT Time Calculation (min) (ACUTE ONLY): 45 min  Charges:  $Therapeutic Exercise: 8-22 mins $Therapeutic Activity: 8-22 mins                     Kallin Henk W,PT Acute Rehabilitation Services Pager:  [phone]  Office:  [place] 01/26/2020, 1:53 PM

## 2020-01-26 NOTE — Progress Notes (Signed)
Advanced Heart Failure Rounding Note   Subjective:    - 8/2 COVID + test - 8/9 Cannulated for VV ECMO - 8/13 with several areas of intracranial hemorrhage. Bival stopped.  - 8/14 CT no change in [place]. Increased edema - 8/16 Extubated - 8/16 Head CT stable bleed  Awake but will not follow commands. Difficult day yesterday. On/off pressors. Had episodes of AF and loaded with amiodarone. O2 sats marginal   Got 1u RBCs last night.   CXR diffuse severe infiltrates. No change. Personally reviewed   PLTs 31K -> 38k . Fibrinogen 145 -> 71   ECMO   Flow 5.1L RPM 4100 Sweep6.5 -> 4 -> 8.5 -> 8 -> 9-> 12  Labs:  7.36/47/47/83% Hgb8.7 PLT 31 -> 38 -> 43 -> 39 -> 33 LDH 429 -> 451 -> 576 PTT => off bival Lactic acid1.6-> 1.5 -> 2.0 -> 1.7 -> 3.5 -> 2.8 Fibrinogen < 60  Objective:   Weight Range:  Vital Signs:   Temp:  [96.3 F (35.7 C)-99.7 F (37.6 C)] 96.3 F (35.7 C) (08/20 0741) Pulse Rate:  [77-164] 91 (08/20 0741) Resp:  [0-28] 18 (08/20 0741) BP: (93-167)/(44-63) 167/61 (08/20 0741) SpO2:  [75 %-96 %] 89 % (08/20 0741) Arterial Line BP: (72-202)/(40-73) 141/57 (08/20 0700) FiO2 (%):  [100 %] 100 % (08/20 0741) Weight:  [150.4 kg] 150.4 kg (08/20 0418) Last BM Date: 01/26/20  Weight change: Filed Weights   01/24/20 0500 01/25/20 0400 01/26/20 0418  Weight: (!) 146.9 kg (!) 147 kg (!) 150.4 kg    Intake/Output:   Intake/Output Summary (Last 24 hours) at 01/26/2020 0747 Last data filed at 01/26/2020 0700 Gross per 24 hour  Intake 6246.64 ml  Output 2275 ml  Net 3971.64 ml     Physical Exam: General:  Ill appearing.Tachypneic HEENT: normal + scleral edema Neck: supple. RIJ ECMO cannula  Cor: PMI nondisplaced. Regular rate & rhythm. No rubs, gallops or murmurs. Lungs: minimal air movement Abdomen: obese soft, nontender, nondistended. No hepatosplenomegaly. No bruits or masses. Good bowel sounds. Extremities: no cyanosis, clubbing, rash, 2+  edema Neuro: awake not following commands  Telemetry: Sinus 90-100 Personally reviewed   Labs: Basic Metabolic Panel: Recent Labs  Lab 01/23/20 0436 01/23/20 0850 01/24/20 0400 01/24/20 0518 01/24/20 1712 01/24/20 2044 01/25/20 0410 01/25/20 0421 01/25/20 1730 01/25/20 1730 01/25/20 1950 01/25/20 2128 01/26/20 0114 01/26/20 0401 01/26/20 0403  NA 148*   < > 149*   < > 154*   < > 154*   < > 152*   < > 152* 151* 151* 149* 152*  K 4.1   < > 3.8   < > 5.2*   < > 4.7   < > 5.8*   < > 5.6* 5.6* 5.1 5.1 5.0  CL 106   < > 109  --  113*  --  114*  --  112*  --   --   --   --  113*  --   CO2 31   < > 31  --  29  --  30  --  26  --   --   --   --  25  --   GLUCOSE 286*   < > 294*  --  256*  --  168*  --  261*  --   --   --   --  188*  --   BUN 97*   < > 118*  --  116*  --  117*  --  150*  --   --   --   --  154*  --   CREATININE 1.13*   < > 1.08*  --  1.06*  --  1.04*  --  1.48*  --   --   --   --  1.64*  --   CALCIUM 8.9   < > 9.0   < > 9.2   < > 9.3  --  9.1  --   --   --   --  8.9  --   MG 2.9*  --   --   --   --   --   --   --   --   --   --   --   --   --   --    < > = values in this interval not displayed.    Liver Function Tests: Recent Labs  Lab 01/22/20 0235 01/23/20 0436 01/24/20 0400 01/25/20 0410 01/26/20 0401  AST 79* 49* 35 35 52*  ALT 62* 64* 57* 53* 61*  ALKPHOS 132* 133* 124 111 151*  BILITOT 2.2* 1.9* 1.9* 1.7* 1.7*  PROT 6.0* 5.5* 5.1* 5.3* 5.2*  ALBUMIN 4.5 3.9 3.4* 3.3* 3.1*   No results for input(s): LIPASE, AMYLASE in the last 168 hours. No results for input(s): AMMONIA in the last 168 hours.  CBC: Recent Labs  Lab 01/24/20 0400 01/24/20 0518 01/24/20 1712 01/24/20 2044 01/25/20 0410 01/25/20 0421 01/25/20 1730 01/25/20 1730 01/25/20 1950 01/25/20 2128 01/26/20 0114 01/26/20 0401 01/26/20 0403  WBC 7.8  --  10.0  --  7.8  --  16.5*  --   --   --   --  12.4*  --   HGB 8.3*   < > 9.0*   < > 8.0*   < > 7.7*   < > 7.5* 8.5* 8.2* 8.7*  8.5*  HCT 28.0*   < > 30.6*   < > 27.2*   < > 27.3*   < > 22.0* 25.0* 24.0* 29.3* 25.0*  MCV 101.8*  --  102.0*  --  102.3*  --  106.6*  --   --   --   --  102.4*  --   PLT 42*  --  43*  --  39*  --  45*  --   --   --   --  33*  --    < > = values in this interval not displayed.    Cardiac Enzymes: No results for input(s): CKTOTAL, CKMB, CKMBINDEX, TROPONINI in the last 168 hours.  BNP: BNP (last 3 results) No results for input(s): BNP in the last 8760 hours.  ProBNP (last 3 results) No results for input(s): PROBNP in the last 8760 hours.    Other results:  Imaging: DG CHEST PORT 1 VIEW  Result Date: 01/25/2020 CLINICAL DATA:  Respiratory failure.  ECMO.  COVID positive. EXAM: PORTABLE CHEST 1 VIEW COMPARISON:  01/24/2020. FINDINGS: Feeding tube, left IJ line, right PICC line, ECMO device in stable position. Complete opacification of both lungs again noted. No interim change. Heart size cannot be accessed due to the complete of opacification of both hemi thoraces. No pneumothorax.  Prior cervical spine fusion. IMPRESSION: 1. Feeding tube, left IJ line, right PICC line, ECMO device in stable position. 2.  Complete opacification of both lungs again noted. Electronically Signed   By: Marcello Moores  Register   On: 01/25/2020 07:10   [place] EKG SITE RITE  Result Date: 01/24/2020 If Site Rite image not attached, placement could not be confirmed due to current cardiac rhythm.    Medications:     Scheduled Medications: . sodium chloride   Intravenous Once  . vitamin C  500 mg Per Tube Daily  . chlorhexidine gluconate (MEDLINE KIT)  15 mL Mouth Rinse BID  . Chlorhexidine Gluconate Cloth  6 each Topical Daily  . clonazePAM  1 mg Per Tube Q6H  . docusate  100 mg Per Tube BID  . etomidate  40 mg Intravenous Once  . feeding supplement (PROSource TF)  45 mL Per Tube BID  . fentaNYL (SUBLIMAZE) injection  200 mcg Intravenous Once  . fentaNYL (SUBLIMAZE) injection  50 mcg Intravenous Once  . free  water  200 mL Per Tube Q4H  . linagliptin  5 mg Per Tube Daily  . mouth rinse  15 mL Mouth Rinse 10 times per day  . methylPREDNISolone (SOLU-MEDROL) injection  20 mg Intravenous Daily  . midazolam  5 mg Intravenous Once  . nutrition supplement (JUVEN)  1 packet Per Tube BID BM  . oxyCODONE  10 mg Per Tube Q6H  . pantoprazole sodium  40 mg Per Tube Daily  . polyethylene glycol  17 g Per Tube Daily  . QUEtiapine  100 mg Per Tube BID  . sennosides  5 mL Per Tube BID  . sodium chloride flush  10-40 mL Intracatheter Q12H  . vecuronium  10 mg Intravenous Once  . zinc sulfate  220 mg Per Tube Daily    Infusions: . sodium chloride 250 mL (01/21/20 1227)  . albumin human 12.5 g (01/24/20 2327)  . amiodarone 30 mg/hr (01/26/20 0700)  . dexmedetomidine (PRECEDEX) IV infusion Stopped (01/26/20 3903)  . feeding supplement (PIVOT 1.5 CAL) 1,000 mL (01/25/20 2313)  . fentaNYL infusion INTRAVENOUS Stopped (01/26/20 0092)  . fluconazole (DIFLUCAN) IV    . insulin 13 mL/hr at 01/26/20 0700  . labetalol (NORMODYNE) infusion 5 mg/mL Stopped (01/24/20 0505)  . milrinone 0.125 mcg/kg/min (01/26/20 0700)  . norepinephrine (LEVOPHED) Adult infusion Stopped (01/26/20 0441)  . piperacillin-tazobactam (ZOSYN)  IV Stopped (01/26/20 0629)  . vancomycin Stopped (01/25/20 2249)  . vasopressin Stopped (01/26/20 0238)    PRN Medications: sodium chloride, acetaminophen (TYLENOL) oral liquid 160 mg/5 mL, albumin human, albuterol, dextrose, fentaNYL, fentaNYL, guaiFENesin-dextromethorphan, hydrALAZINE, hydrOXYzine, ondansetron **OR** ondansetron (ZOFRAN) IV, sodium chloride flush   Assessment/Plan:   1. Acute hypoxic/hypercapneic respiratory failure in setting of severe COVID PNA/ARDS -> VV ECMO - admit 8/2 - intubation 8/3 - has received actmera (compelted 8/2), remdesivir (completed 8/6) and steroids - failed full vent support with proning/paralytic - Cannulated for VV ECMO on 8/9 - Extubated 8/16 -  Continues to deteriorate with lactic acidosis and multi-system organ failure - CXR unchanged - Off bival due to [place].  - ECMO circuit with fibrin and some clots but functioning ok off anticoagulation for now. May need to start low-dose heparin soon. Would not tolerate circuit change due to instability - She is nearly 10 days in on pump support and continues to deteriorate. I do not think we can have a meaningful recovery here and we are like near or at the point of futility. Will need to update family  2. Enterococcus sepsis - continue vanc + high dose zosyn per pharmacy  - No change  3. Intracranial hemorrhage - ? Septic emboli - repeat head CT on 8/14 with stable bleeds but increased edema - neurology following. Suspect significant long-term  injury sustained. Will follow commands. Appears to have dense LUE weakness and possibly LLE - repeat head CT stable 8/16 - No AC with ICH for now - Unresponsive on exam today. Unable to further evalaute  4. Thrombocytopenia - suspect due to sepsis.  - no obvious bleeding - would not transfuse PLTs unless absolutely necessary given risk of clotting pump - PLTs remain < 35k  4. Morbid obesity - Body mass index is 49.38 kg/m.  5. Poorly controlled DM2 - HgBA1c 10.7 - CBGs remain elevated - On IV insulin adjust as needed  6. Hypernatremia - Na 152. On FW  7. Lactic acidosis -  Worsened yesterday. Slightly improved today but still elevated  8. PAF - back in NSR on IV amio this am   CRITICAL CARE Performed by: Glori Bickers  Total critical care time: 45 minutes  Critical care time was exclusive of separately billable procedures and treating other patients.  Critical care was necessary to treat or prevent imminent or life-threatening deterioration.  Critical care was time spent personally by me (independent of midlevel providers or residents) on the following activities: development of treatment plan with patient and/or  surrogate as well as nursing, discussions with consultants, evaluation of patient's response to treatment, examination of patient, obtaining history from patient or surrogate, ordering and performing treatments and interventions, ordering and review of laboratory studies, ordering and review of radiographic studies, pulse oximetry and re-evaluation of patient's condition.   Length of Stay: 18   Glori Bickers  MD 01/26/2020, 7:47 AM  Advanced Heart Failure Team Pager [phone] (M-F; 7a - 4p)  Please contact [facility] for night-coverage after hours (4p -7a ) and weekends on [email or web address]

## 2020-01-26 NOTE — Progress Notes (Signed)
Assisted tele visit to patient with family member.  Dalton Molesworth Samson, RN  

## 2020-01-26 NOTE — Progress Notes (Signed)
Occupational Therapy Treatment Note  Pt seen on 30L 100% FiO2; on ECMO. Pt inconsistently following 1 step commands - moving RU/LE. Tracking therapist at times however poor visual attention. Mouthing words at times but unable to tell what she was saying. Assisted with repositioning to increase ability to sit in modified chair position. Edematous extremties - keep BUE elevated on 2 pillow to reduce dependent edema. SpO2 desat to @ 79; Max HR 140s; BP 116/51. Pt will need extensive postacute rehab. Will continue to follow.   01/26/20 1400  OT Visit Information  Last OT Received On 01/26/20  Assistance Needed +2  PT/OT/SLP Co-Evaluation/Treatment Yes  Reason for Co-Treatment Complexity of the patient's impairments (multi-system involvement);For patient/therapist safety  OT goals addressed during session ADL's and self-care  History of Present Illness 54 yo admitted 8/2 with hypoxemia due to Covid 19 PNA. 8/3 intubated. 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  PMHx: DM, diverticulitis, Asthma  Precautions  Precautions Other (comment)  Precaution Comments ECMO with anchors at right shoulder and chest  Pain Assessment  Pain Assessment Faces  Faces Pain Scale 0  Cognition  Arousal/Alertness Lethargic  Behavior During Therapy Flat affect  Overall Cognitive Status Impaired/Different from baseline  Area of Impairment Following commands  Following Commands Follows one step commands inconsistently  General Comments Moved UE and LE to command with up to 10-12 ssecond delayed response time. Appeared to look at photos of family, shook head no a couple times inconsistently, and appeared to look at PT/OT to command at times.  Pt also mouthed something to OT/PT but could not tell what pt was saying.   Difficult to assess due to Level of arousal  ADL  Overall ADL's  Needs assistance/impaired  General ADL Comments currently totalA  Bed Mobility  General bed  mobility comments total A +2 for any repositioing; pt does not attmept to help although eyes open; SpO2 low as 79 during seated posiitonal activities; standing not attmepted at this time  Balance  Overall balance assessment Needs assistance  Sitting balance-Leahy Scale Zero  Sitting balance - Comments does not attmept to right herself  Vision- Assessment  Vision Assessment? Vision impaired- to be further tested in functional context  Additional Comments delay blink to threat; poor visual attention  Transfers  General transfer comment unable to attempt  Exercises  Exercises General Lower Extremity  General Exercises - Upper Extremity  Shoulder Flexion AAROM;Both;10 reps;Supine (LUE PROM; completed within restrictions of ECMO cannula)  Elbow Flexion AAROM;Both;10 reps (LUE PROM)  Elbow Extension AROM;Right;Left;10 reps (PROM L)  Wrist Flexion Right;10 reps;Supine (PROM L)  Wrist Extension Right;10 reps;AAROM (PROM L)  Digit Composite Flexion AAROM;Right;10 reps (PROM L)  Composite Extension AAROM;Right;10 reps (PROM L)  OT - End of Session  Equipment Utilized During Treatment Oxygen (30L; FiO2 100%)  Activity Tolerance Patient limited by lethargy;Patient limited by fatigue;Other (comment) (limited due to medical condition)  Patient left in bed;with call bell/phone within reach;Other (comment);with nursing/sitter in room (modified chair position)  Nurse Communication Mobility status;Other (comment) (encourage chari position)  OT Assessment/Plan  OT Plan Discharge plan remains appropriate  OT Visit Diagnosis Other abnormalities of gait and mobility (R26.89);Muscle weakness (generalized) (M62.81);Other symptoms and signs involving cognitive function  OT Frequency (ACUTE ONLY) Min 2X/week  Follow Up Recommendations LTACH;SNF;Other (comment) (post acute rehab)  OT Equipment Other (comment) (will assess)  AM-PAC OT "6 Clicks" Daily Activity Outcome Measure (Version 2)  Help from  another person eating meals?  1  Help from another person taking care of personal grooming? 1  Help from another person toileting, which includes using toliet, bedpan, or urinal? 1  Help from another person bathing (including washing, rinsing, drying)? 1  Help from another person to put on and taking off regular upper body clothing? 1  Help from another person to put on and taking off regular lower body clothing? 1  6 Click Score 6  OT Goal Progression  Progress towards OT goals Not progressing toward goals - comment (due to medical status)  Acute Rehab OT Goals  Patient Stated Goal pt unable to state  OT Goal Formulation Patient unable to participate in goal setting  Time For Goal Achievement 02/06/20  Potential to Achieve Goals Fair  ADL Goals  Pt Will Perform Grooming with max assist;bed level;sitting  Pt/caregiver will Perform Home Exercise Program Increased ROM;Increased strength;Both right and left upper extremity;With minimal assist;With written HEP provided  Additional ADL Goal #1 Pt will follow 1 step commands wtih 50% accuracy.  Additional ADL Goal #2 Pt will tolerate tilt via Kreg bed >/=26min with VSS.  OT Time Calculation  OT Start Time (ACUTE ONLY) 1230  OT Stop Time (ACUTE ONLY) 1320  OT Time Calculation (min) 50 min  OT General Charges  $OT Visit 1 Visit  OT Treatments  $Therapeutic Activity 8-22 mins  Maurie Boettcher, OT/L   Acute OT Clinical Specialist [place] Pager [phone] Office [phone]

## 2020-01-26 NOTE — Procedures (Signed)
Extracorporeal support note   ECLS support day: 9 days  Indication: COVID 19 pneumonia with ARDS.   Configuration: VV.   Drainage cannula: right IJ 2 stage cannula Return cannula: Right IJ 2 stage cannula  Pump speed: 4100 Pump flow: 5.15 Pump used: Centrimag  Oxygenator: Quadrox O2 blender: 100% Sweep gas: 9  Circuit check: white fibrin clot on venous side Anticoagulant: on low dose heparin Anticoagulation targets: 0,2-0.4  Changes in support: continue current support.  Anticipated goals/duration of support: bridge to recovery. Patient is extubated- wean to decannulation.  Kipp Brood, MD [facility] ICU Physician [place]  Pager: [phone] Mobile: [phone] After hours: [phone].  01/26/2020, 9:38 AM

## 2020-01-26 NOTE — Progress Notes (Signed)
ECMO PROGRESS NOTE  NAME:  Alisha Stephenson, MRN:  [id number], DOB:  14-Nov-1965, LOS: 7 ADMISSION DATE:  01/21/2020, ECMO CONSULTATION DATE:  8/9 REFERRING MD:  Ruthann Cancer - PCCM, CHIEF COMPLAINT:  Hypoxic respiratory failure.    HPI/course in hospital  53 y/o female admitted on 8/2 with severe acute respiratory failure with hypoxemia due to COVID 19 pneumonia. She developed symptoms 1 week prior to admission.  8/2 admit 8/3 ICU transfer, intubated 8/4 prone, paralyze 8/9 significant desaturations today 8/9 VV ECMO cannulation 8/13 head bleed - multifocal ICH without mass effect.  8/16 extubated.   Past Medical History   Past Medical History:  Diagnosis Date  . Asthma   . Diabetes mellitus without complication ([place])   . Diverticulitis   . Gallstones   . IBS (irritable bowel syndrome)   . NAFLD (nonalcoholic fatty liver disease)    CT scan 2015  . Ovarian cyst      Past Surgical History:  Procedure Laterality Date  . ABDOMINAL HYSTERECTOMY    . CESAREAN SECTION    . CHOLECYSTECTOMY    . COLONOSCOPY     about 2011  . ECMO CANNULATION N/A 01/18/2020   Procedure: ECMO CANNULATION;  Surgeon: Jolaine Artist, MD;  Location: [place] [facility];  Service: Cardiovascular;  Laterality: N/A;      Interim history/subjective:   Increased respiratory effort overnight with marginal saturations.   Now awake and following commands. Nods appropriately to questions indicating that back hurts.   Objective   Blood pressure (!) 167/61, pulse 88, temperature (!) 96.4 F (35.8 C), resp. rate (!) 7, height 5\' 4"  (1.626 m), weight (!) 150.4 kg, SpO2 91 %.    FiO2 (%):  [100 %] 100 %   Intake/Output Summary (Last 24 hours) at 01/26/2020 1010 Last data filed at 01/26/2020 0900 Gross per 24 hour  Intake 6013.57 ml  Output 2400 ml  Net 3613.57 ml   Filed Weights   01/24/20 0500 01/25/20 0400 01/26/20 0418  Weight: (!) 146.9 kg (!) 147 kg (!) 150.4 kg    ECMO Device: Centrimag   ECMO Mode: VV  Flow (LPM): 5.13   Examination: Physical Exam Constitutional:      Appearance: She is obese. She is ill-appearing.  HENT:     Head: Normocephalic.  Eyes:     General: Scleral icterus present.  Cardiovascular:     Rate and Rhythm: Normal rate and regular rhythm.     Heart sounds: Normal heart sounds.  Pulmonary:     Comments: Bilaterally absent breath sounds.  Abdominal:     General: Abdomen is protuberant.  Neurological:     Mental Status: She is alert.     GCS: GCS eye subscore is 4. GCS motor subscore is 6.     Motor: Weakness present.     Comments: Eyes are open, some tracking to voice. Attempting to talk. Squeezed fingers weakly on the right side.      Ancillary tests (personally reviewed)  CBC: Recent Labs  Lab 01/24/20 0400 01/24/20 0518 01/24/20 1712 01/24/20 2044 01/25/20 0410 01/25/20 0421 01/25/20 1730 01/25/20 1730 01/25/20 1950 01/25/20 2128 01/26/20 0114 01/26/20 0401 01/26/20 0403  WBC 7.8  --  10.0  --  7.8  --  16.5*  --   --   --   --  12.4*  --   HGB 8.3*   < > 9.0*   < > 8.0*   < >  7.7*   < > 7.5* 8.5* 8.2* 8.7* 8.5*  HCT 28.0*   < > 30.6*   < > 27.2*   < > 27.3*   < > 22.0* 25.0* 24.0* 29.3* 25.0*  MCV 101.8*  --  102.0*  --  102.3*  --  106.6*  --   --   --   --  102.4*  --   PLT 42*  --  43*  --  39*  --  45*  --   --   --   --  33*  --    < > = values in this interval not displayed.    Basic Metabolic Panel: Recent Labs  Lab 01/23/20 0436 01/23/20 0850 01/24/20 0400 01/24/20 0518 01/24/20 1712 01/24/20 2044 01/25/20 0410 01/25/20 0421 01/25/20 1730 01/25/20 1730 01/25/20 1950 01/25/20 2128 01/26/20 0114 01/26/20 0401 01/26/20 0403  NA 148*   < > 149*   < > 154*   < > 154*   < > 152*   < > 152* 151* 151* 149* 152*  K 4.1   < > 3.8   < > 5.2*   < > 4.7   < > 5.8*   < > 5.6* 5.6* 5.1 5.1 5.0  CL 106   < > 109  --  113*  --  114*  --  112*  --   --   --   --  113*  --   CO2 31   < > 31  --  29  --  30  --  26   --   --   --   --  25  --   GLUCOSE 286*   < > 294*  --  256*  --  168*  --  261*  --   --   --   --  188*  --   BUN 97*   < > 118*  --  116*  --  117*  --  150*  --   --   --   --  154*  --   CREATININE 1.13*   < > 1.08*  --  1.06*  --  1.04*  --  1.48*  --   --   --   --  1.64*  --   CALCIUM 8.9   < > 9.0  --  9.2  --  9.3  --  9.1  --   --   --   --  8.9  --   MG 2.9*  --   --   --   --   --   --   --   --   --   --   --   --   --   --    < > = values in this interval not displayed.   GFR: Estimated Creatinine Clearance: 56.9 mL/min (A) (by C-G formula based on SCr of 1.64 mg/dL (H)). Recent Labs  Lab 01/24/20 0400 01/24/20 0400 01/24/20 1712 01/25/20 0410 01/25/20 0957 01/25/20 1722 01/25/20 1730 01/26/20 0401  WBC 7.8   < > 10.0 7.8  --   --  16.5* 12.4*  LATICACIDVEN 1.7  --   --   --  3.1* 3.5*  --  2.8*   < > = values in this interval not displayed.    Liver Function Tests: Recent Labs  Lab 01/22/20 0235 01/23/20 0436 01/24/20 0400 01/25/20 0410 01/26/20 0401  AST 79* 49* 35 35 52*  ALT 62* 64* 57* 53* 61*  ALKPHOS 132* 133* 124 111 151*  BILITOT 2.2* 1.9* 1.9* 1.7* 1.7*  PROT 6.0* 5.5* 5.1* 5.3* 5.2*  ALBUMIN 4.5 3.9 3.4* 3.3* 3.1*   No results for input(s): LIPASE, AMYLASE in the last 168 hours. No results for input(s): AMMONIA in the last 168 hours.  ABG    Component Value Date/Time   PHART 7.361 01/26/2020 0403   PCO2ART 47.1 01/26/2020 0403   PO2ART 47 (L) 01/26/2020 0403   HCO3 26.9 01/26/2020 0403   TCO2 28 01/26/2020 0403   ACIDBASEDEF 2.0 01/11/2020 1203   O2SAT 83.0 01/26/2020 0403     Coagulation Profile: Recent Labs  Lab 01/22/20 0923 01/23/20 0436 01/24/20 0400 01/25/20 0410 01/26/20 0401  INR 2.0* 1.5* 1.7* 2.3* 2.5*    Cardiac Enzymes: No results for input(s): CKTOTAL, CKMB, CKMBINDEX, TROPONINI in the last 168 hours.  HbA1C: Hgb A1c MFr Bld  Date/Time Value Ref Range Status  01/28/2020 03:56 AM 10.7 (H) 4.8 - 5.6 % Final     Comment:    (NOTE) Pre diabetes:          5.7%-6.4%  Diabetes:              >6.4%  Glycemic control for   <7.0% adults with diabetes   09/04/2019 03:22 PM 9.9 (H) 4.6 - 6.5 % Final    Comment:    Glycemic Control Guidelines for People with Diabetes:Non Diabetic:  <6%Goal of Therapy: <7%Additional Action Suggested:  >8%     CBG: Recent Labs  Lab 01/25/20 2213 01/26/20 0111 01/26/20 0401 01/26/20 0625 01/26/20 0904  GLUCAP 218* 185* 190* 150* 123*     Assessment & Plan:   Critically ill due to acute hypoxic respiratory failure requiring HHFNC  and VV ECMO support.  Critically ill due to shock from presumed RV failure requiring initiation of milrinone. ICH  Enterococcal bacteremia Acute toxic encephalopathy Thrombocytopenia.  DM type 2 Obesity  Plan:   Calm now that sedation worn off. Diffusely weak. May have improved with broadened antimicrobial coverage.  Continue extubated ECMO - lungs are not contributing to gas exchange. Maintain higher transfusion threshold.  Continue milrinone for RV support.  Resume low dose anticoagulation as increasing fibrinogen deposition in circuit.  Continue Zosyn, Vancomycin and fluconazole pending cultures.   Daily Goals Checklist  Pain/Anxiety/Delirium protocol (if indicated): trial on enteral sedation only VAP protocol (if indicated): not intubated.  Respiratory support goals:  Continue Current support.  Blood pressure target: initiate milrinone and titrate NE down to keep MAP >65. If needs ongoing vasopressor support, vasopressin may be better from pulmonary hypertension standpoint.  DVT prophylaxis: restart low dose heparin infusion.  Nutritional status and feeding goals: Tolerating tube feeds GI prophylaxis: Pantoprazole Fluid status goals: gentle diuresis.  Urinary catheter: Assessment of intravascular volume and Guide hemodynamic management Central lines: right radial arterial line, Left IJ TLC, 3L PICC Glucose control:  Remains hyperglycemic on IV insulin Mobility/therapy needs: Bedrest.  Antibiotic de-escalation: Zosyn and Vancomycin. Home medication reconciliation: on hold Daily labs: Per ECMO protocol Code Status: No CPR in case of cardiac arrest. Family Communication: Updated husband and daughter yesterday and impressed the gravity of her illness. Emphasized that condition of her lungs will ultimately determine her prognosis but that co-morbidities such as ICH are complicating factors which may come into play regarding her ultimate prognosis and whether ongoing ECMO support is advisable. Encouraged ongoing Palliative care support to help navigate the complex decision making.  Disposition: ICU.  Critical care time: 29 min    Kipp Brood, MD [facility] ICU Physician [place]  Pager: [phone] Mobile: [phone] After hours: [phone].  01/26/2020, 10:10 AM

## 2020-01-26 NOTE — Progress Notes (Signed)
Assisted tele visit to patient with family member.  Relena Ivancic DNP eLink RN 

## 2020-01-26 NOTE — Progress Notes (Signed)
Nutrition Follow Up  DOCUMENTATION CODES:   Morbid obesity  INTERVENTION:   Continue tube feeding:  -Pivot 1.5 @ 75 ml/hr (1800 ml) via NG -45 ml Prosource BID -Free water 200 ml Q4 hours   Provides: 2780 kcals, 191 grams protein, 1350 ml free water (2550 ml with flushes). Meets 100% needs.   Continue Juven Fruit Punch BID, each serving provides 95kcal and 2.5g of protein (amino acids glutamine and arginine)   NUTRITION DIAGNOSIS:   Increased nutrient needs related to acute illness (COVID-19 infection) as evidenced by estimated needs.  Ongoing  GOAL:   Patient will meet greater than or equal to 90% of their needs  Addressed via TF  MONITOR:   Vent status, TF tolerance, Labs, Weight trends  REASON FOR ASSESSMENT:   Ventilator, Consult Enteral/tube feeding initiation and management  ASSESSMENT:   54 y.o. female with medical history of type 2 DM. She presented to the ED at [facility] with SOB diarrhea x1 week, and cough. She was dx with COVID-19 one week prior to presentation to the ED.   8/9- tx from [facility], s/p VV ECMO cannulation 8/13- CT head with multiple areas of [place] 8/16- extubated, gastric Cortrak placed   Pt discussed during ICU rounds and with RN.   Off sedation. Awake and following commands. Remains weak. Tolerating tube feeding at goal rate. Now hypernatremic- free water flushes added per CCM. Insulin drip stopped briefly this am due CBG 123. CBGs now in 200-300 range, drip restarted.   [place] discussions ongoing.   Admission weight: 133.6 kg  Current weight: 150.4 kg (up 4 kg over the last three days)  I/O: +6,440 ml since 8/6 UOP: 1,275 ml x 24 hrs  Stool: 1000 ml x 24 hrs   Drips: insulin, milrinone Medications: 500 mg Vit C, colace, tradjenta, miralax, senokot, zinc sulfate  Labs: Na 152 (H) Cr 1.64-trending up BUN- trending up CBG 150-360   Diet Order:   Diet Order            Diet NPO time specified  Diet effective midnight                  EDUCATION NEEDS:   No education needs have been identified at this time  Skin:  Skin Assessment: Skin Integrity Issues: Skin Integrity Issues:: Other (Comment) Other: MASD-breast skin tears-face/chest/chin  Last BM:  8/20 via rectal tube  Height:   Ht Readings from Last 1 Encounters:  01/29/2020 5\' 4"  (1.626 m)    Weight:   Wt Readings from Last 1 Encounters:  01/26/20 (!) 150.4 kg    Ideal Body Weight:  54.5 kg Adj Body Weight: 84.7 kg   BMI:  Body mass index is 56.91 kg/m.  Estimated Nutritional Needs:   Kcal:  4967-5916 kcal  Protein:  160-195 grams  Fluid:  >/= 2 L/day  Mariana Single RD, LDN Clinical Nutrition Pager listed in [place]

## 2020-01-26 NOTE — Progress Notes (Signed)
[place] for Infectious Disease    Date of Admission:  01/27/2020   Total days of antibiotics 9/day 2 of flu,vanco          ID: Alisha Stephenson is a 54 y.o. female with  Principal Problem:   Acute respiratory distress syndrome (ARDS) due to COVID-19 virus ([place]) Active Problems:   NAFLD (nonalcoholic fatty liver disease)   Diabetes mellitus type 1.5, managed as type 1 (HCC)   Pneumonia due to COVID-19 virus   Intracerebral hemorrhage   Personal history of ECMO   Palliative care by specialist   Goals of care, counseling/discussion    Subjective: Had elevated temperature yesterday in the setting of leukocytosis and increased pressor requirements. Eyes opened, possible tracking  Medications:   sodium chloride   Intravenous Once   vitamin C  500 mg Per Tube Daily   chlorhexidine gluconate (MEDLINE KIT)  15 mL Mouth Rinse BID   Chlorhexidine Gluconate Cloth  6 each Topical Daily   clonazePAM  1 mg Per Tube Q6H   docusate  100 mg Per Tube BID   etomidate  40 mg Intravenous Once   feeding supplement (PROSource TF)  45 mL Per Tube BID   fentaNYL (SUBLIMAZE) injection  200 mcg Intravenous Once   fentaNYL (SUBLIMAZE) injection  50 mcg Intravenous Once   free water  200 mL Per Tube Q4H   linagliptin  5 mg Per Tube Daily   mouth rinse  15 mL Mouth Rinse 10 times per day   midazolam  5 mg Intravenous Once   nutrition supplement (JUVEN)  1 packet Per Tube BID BM   oxyCODONE  10 mg Per Tube Q6H   pantoprazole sodium  40 mg Per Tube Daily   polyethylene glycol  17 g Per Tube Daily   QUEtiapine  100 mg Per Tube BID   sennosides  5 mL Per Tube BID   sodium chloride flush  10-40 mL Intracatheter Q12H   vecuronium  10 mg Intravenous Once   zinc sulfate  220 mg Per Tube Daily    Objective: Vital signs in last 24 hours: Temp:  [96.1 F (35.6 C)-99.7 F (37.6 C)] 96.1 F (35.6 C) (08/20 1600) Pulse Rate:  [77-153] 128 (08/20 1600) Resp:  [0-27] 0 (08/20  1600) BP: (93-167)/(44-63) 134/57 (08/20 1512) SpO2:  [77 %-96 %] 83 % (08/20 1600) Arterial Line BP: (77-202)/(43-73) 130/59 (08/20 1600) FiO2 (%):  [100 %] 100 % (08/20 1512) Weight:  [150.4 kg] 150.4 kg (08/20 0418) Physical Exam  Constitutional:  oriented to person, place, and time. appears well-developed and well-nourished. No distress.  HENT: [place]/AT, PERRLA, no scleral icterus Mouth/Throat: [place] cannula Cardiovascular: tachycardic. Exam reveals no gallop and no friction rub.  No murmur heard.  Pulmonary/Chest: Effort normal and breath sounds normal. Bilateral rhonchi Neck = ecmo cannula in place Ext = right picc line in place dressed. Blisters/skin tear on back side of arm. Anasarca+ Abdominal: Soft. Bowel sounds are normal.  exhibits no distension. Rectal tube in place Skin: Skin is warm and dry. No rash noted. No erythema.     Lab Results Recent Labs    01/25/20 1730 01/25/20 1950 01/26/20 0401 01/26/20 0401 01/26/20 0403 01/26/20 1710  WBC 16.5*   < > 12.4*  --   --  13.3*  HGB 7.7*   < > 8.7*   < > 8.5* 8.5*  HCT 27.3*   < > 29.3*   < > 25.0* 29.2*  NA 152*   < >  149*  --  152*  --   K 5.8*   < > 5.1  --  5.0  --   CL 112*  --  113*  --   --   --   CO2 26  --  25  --   --   --   BUN 150*  --  154*  --   --   --   CREATININE 1.48*  --  1.64*  --   --   --    < > = values in this interval not displayed.   Liver Panel Recent Labs    01/25/20 0410 01/26/20 0401  PROT 5.3* 5.2*  ALBUMIN 3.3* 3.1*  AST 35 52*  ALT 53* 61*  ALKPHOS 111 151*  BILITOT 1.7* 1.7*  BILIDIR 0.4* 0.5*  IBILI 1.3* 1.2*    Microbiology: 8/19 blood cx pending 8/12 blood cx enterococcus faecalis Studies/Results: DG CHEST PORT 1 VIEW  Result Date: 01/26/2020 CLINICAL DATA:  Hypoxia EXAM: PORTABLE CHEST 1 VIEW COMPARISON:  January 25, 2020 FINDINGS: ECMO catheter tip is below the diaphragm. Feeding tube tip is below the diaphragm. Left jugular catheter tip is in the superior vena cava.  No pneumothorax. Diffuse opacification of the lungs bilaterally with likely superimposed pleural effusions noted. Heart is obscured by diffuse airspace opacity. Grossly stable cardiac silhouette. Postoperative change noted in the lower cervical spine. IMPRESSION: Tube and catheter positions as described without appreciable pneumothorax. Diffuse airspace opacity throughout the lungs with likely superimposed pleural effusions noted. Grossly stable cardiac silhouette. Electronically Signed   By: Lowella Grip III M.D.   On: 01/26/2020 08:05   DG CHEST PORT 1 VIEW  Result Date: 01/25/2020 CLINICAL DATA:  Respiratory failure.  ECMO.  COVID positive. EXAM: PORTABLE CHEST 1 VIEW COMPARISON:  01/24/2020. FINDINGS: Feeding tube, left IJ line, right PICC line, ECMO device in stable position. Complete opacification of both lungs again noted. No interim change. Heart size cannot be accessed due to the complete of opacification of both hemi thoraces. No pneumothorax.  Prior cervical spine fusion. IMPRESSION: 1. Feeding tube, left IJ line, right PICC line, ECMO device in stable position. 2.  Complete opacification of both lungs again noted. Electronically Signed   By: Marcello Moores  Register   On: 01/25/2020 07:10     Assessment/Plan: Elevated temp/leukocytosis = expanded antibiotics yesterday for possible new secondary infection. Following culture results. The day prior picc line had migrated out but no signs of cellulitis or purulence in that area  Enterococcal bacteremia = on piptazo  mssa pneumonia = covered by piptazo  Severe covid illness with multiorgan involvement = continue on ecmo managed by PCCM and cardiology  Thrombocytopenia = slightly improved but still oozing from several sites, continue to monitor  Remains critically ill and overall condition guarded  Dr Lucianne Lei dam to see over the weekend  [facility] for Infectious Diseases Cell: [phone] Pager:  [phone]  01/26/2020, 5:44 PM

## 2020-01-27 ENCOUNTER — Inpatient Hospital Stay (HOSPITAL_COMMUNITY): Payer: PRIVATE HEALTH INSURANCE

## 2020-01-27 LAB — LACTIC ACID, PLASMA
Lactic Acid, Venous: 1.9 mmol/L (ref 0.5–1.9)
Lactic Acid, Venous: 2 mmol/L (ref 0.5–1.9)

## 2020-01-27 LAB — POCT I-STAT 7, (LYTES, BLD GAS, ICA,H+H)
Acid-Base Excess: 1 mmol/L (ref 0.0–2.0)
Acid-Base Excess: 0 mmol/L (ref 0.0–2.0)
Acid-base deficit: 1 mmol/L (ref 0.0–2.0)
Bicarbonate: 25.8 mmol/L (ref 20.0–28.0)
Bicarbonate: 25.8 mmol/L (ref 20.0–28.0)
Bicarbonate: 24.8 mmol/L (ref 20.0–28.0)
Calcium, Ion: 1.24 mmol/L (ref 1.15–1.40)
Calcium, Ion: 1.29 mmol/L (ref 1.15–1.40)
Calcium, Ion: 1.28 mmol/L (ref 1.15–1.40)
HCT: 24 % — ABNORMAL LOW (ref 36.0–46.0)
HCT: 24 % — ABNORMAL LOW (ref 36.0–46.0)
HCT: 24 % — ABNORMAL LOW (ref 36.0–46.0)
Hemoglobin: 8.2 g/dL — ABNORMAL LOW (ref 12.0–15.0)
Hemoglobin: 8.2 g/dL — ABNORMAL LOW (ref 12.0–15.0)
Hemoglobin: 8.2 g/dL — ABNORMAL LOW (ref 12.0–15.0)
O2 Saturation: 86 %
O2 Saturation: 86 %
O2 Saturation: 79 %
Patient temperature: 35.4
Patient temperature: 35.5
Patient temperature: 35.6
Potassium: 4.9 mmol/L (ref 3.5–5.1)
Potassium: 4.5 mmol/L (ref 3.5–5.1)
Potassium: 4.7 mmol/L (ref 3.5–5.1)
Sodium: 149 mmol/L — ABNORMAL HIGH (ref 135–145)
Sodium: 150 mmol/L — ABNORMAL HIGH (ref 135–145)
Sodium: 150 mmol/L — ABNORMAL HIGH (ref 135–145)
TCO2: 27 mmol/L (ref 22–32)
TCO2: 27 mmol/L (ref 22–32)
TCO2: 26 mmol/L (ref 22–32)
pCO2 arterial: 36.2 mmHg (ref 32.0–48.0)
pCO2 arterial: 45.9 mmHg (ref 32.0–48.0)
pCO2 arterial: 45.6 mmHg (ref 32.0–48.0)
pH, Arterial: 7.454 — ABNORMAL HIGH (ref 7.350–7.450)
pH, Arterial: 7.351 (ref 7.350–7.450)
pH, Arterial: 7.337 — ABNORMAL LOW (ref 7.350–7.450)
pO2, Arterial: 45 mmHg — ABNORMAL LOW (ref 83.0–108.0)
pO2, Arterial: 51 mmHg — ABNORMAL LOW (ref 83.0–108.0)
pO2, Arterial: 43 mmHg — ABNORMAL LOW (ref 83.0–108.0)

## 2020-01-27 LAB — CBC
HCT: 28.1 % — ABNORMAL LOW (ref 36.0–46.0)
HCT: 28.8 % — ABNORMAL LOW (ref 36.0–46.0)
Hemoglobin: 8.7 g/dL — ABNORMAL LOW (ref 12.0–15.0)
Hemoglobin: 8.6 g/dL — ABNORMAL LOW (ref 12.0–15.0)
MCH: 31.3 pg (ref 26.0–34.0)
MCH: 30.9 pg (ref 26.0–34.0)
MCHC: 31 g/dL (ref 30.0–36.0)
MCHC: 29.9 g/dL — ABNORMAL LOW (ref 30.0–36.0)
MCV: 101.1 fL — ABNORMAL HIGH (ref 80.0–100.0)
MCV: 103.6 fL — ABNORMAL HIGH (ref 80.0–100.0)
Platelets: 37 10*3/uL — ABNORMAL LOW (ref 150–400)
Platelets: 35 10*3/uL — ABNORMAL LOW (ref 150–400)
RBC: 2.78 MIL/uL — ABNORMAL LOW (ref 3.87–5.11)
RBC: 2.78 MIL/uL — ABNORMAL LOW (ref 3.87–5.11)
RDW: 22 % — ABNORMAL HIGH (ref 11.5–15.5)
RDW: 23.1 % — ABNORMAL HIGH (ref 11.5–15.5)
WBC: 11.8 10*3/uL — ABNORMAL HIGH (ref 4.0–10.5)
WBC: 11 10*3/uL — ABNORMAL HIGH (ref 4.0–10.5)
nRBC: 5.1 % — ABNORMAL HIGH (ref 0.0–0.2)
nRBC: 5.9 % — ABNORMAL HIGH (ref 0.0–0.2)

## 2020-01-27 LAB — BASIC METABOLIC PANEL
Anion gap: 15 (ref 5–15)
Anion gap: 16 — ABNORMAL HIGH (ref 5–15)
BUN: 171 mg/dL — ABNORMAL HIGH (ref 6–20)
BUN: 183 mg/dL — ABNORMAL HIGH (ref 6–20)
CO2: 25 mmol/L (ref 22–32)
CO2: 25 mmol/L (ref 22–32)
Calcium: 9.1 mg/dL (ref 8.9–10.3)
Calcium: 9 mg/dL (ref 8.9–10.3)
Chloride: 111 mmol/L (ref 98–111)
Chloride: 110 mmol/L (ref 98–111)
Creatinine, Ser: 1.81 mg/dL — ABNORMAL HIGH (ref 0.44–1.00)
Creatinine, Ser: 1.77 mg/dL — ABNORMAL HIGH (ref 0.44–1.00)
GFR calc Af Amer: 36 mL/min — ABNORMAL LOW (ref >60)
GFR calc Af Amer: 37 mL/min — ABNORMAL LOW (ref >60)
GFR calc non Af Amer: 31 mL/min — ABNORMAL LOW (ref >60)
GFR calc non Af Amer: 32 mL/min — ABNORMAL LOW (ref >60)
Glucose, Bld: 368 mg/dL — ABNORMAL HIGH (ref 70–99)
Glucose, Bld: 457 mg/dL — ABNORMAL HIGH (ref 70–99)
Potassium: 5 mmol/L (ref 3.5–5.1)
Potassium: 5.3 mmol/L — ABNORMAL HIGH (ref 3.5–5.1)
Sodium: 151 mmol/L — ABNORMAL HIGH (ref 135–145)
Sodium: 151 mmol/L — ABNORMAL HIGH (ref 135–145)

## 2020-01-27 LAB — GLUCOSE, CAPILLARY
Glucose-Capillary: 324 mg/dL — ABNORMAL HIGH (ref 70–99)
Glucose-Capillary: 349 mg/dL — ABNORMAL HIGH (ref 70–99)
Glucose-Capillary: 170 mg/dL — ABNORMAL HIGH (ref 70–99)
Glucose-Capillary: 398 mg/dL — ABNORMAL HIGH (ref 70–99)
Glucose-Capillary: 393 mg/dL — ABNORMAL HIGH (ref 70–99)
Glucose-Capillary: 365 mg/dL — ABNORMAL HIGH (ref 70–99)
Glucose-Capillary: 375 mg/dL — ABNORMAL HIGH (ref 70–99)

## 2020-01-27 LAB — APTT
aPTT: 67 seconds — ABNORMAL HIGH (ref 24–36)
aPTT: 40 seconds — ABNORMAL HIGH (ref 24–36)

## 2020-01-27 LAB — HEPATIC FUNCTION PANEL
ALT: 61 U/L — ABNORMAL HIGH (ref 0–44)
AST: 40 U/L (ref 15–41)
Albumin: 2.8 g/dL — ABNORMAL LOW (ref 3.5–5.0)
Alkaline Phosphatase: 135 U/L — ABNORMAL HIGH (ref 38–126)
Bilirubin, Direct: 0.5 mg/dL — ABNORMAL HIGH (ref 0.0–0.2)
Indirect Bilirubin: 1.5 mg/dL — ABNORMAL HIGH (ref 0.3–0.9)
Total Bilirubin: 2 mg/dL — ABNORMAL HIGH (ref 0.3–1.2)
Total Protein: 5 g/dL — ABNORMAL LOW (ref 6.5–8.1)

## 2020-01-27 LAB — PROTIME-INR
INR: 1.9 — ABNORMAL HIGH (ref 0.8–1.2)
Prothrombin Time: 21.4 seconds — ABNORMAL HIGH (ref 11.4–15.2)

## 2020-01-27 LAB — FIBRINOGEN: Fibrinogen: <60 mg/dL — CL (ref 210–475)

## 2020-01-27 LAB — LACTATE DEHYDROGENASE: LDH: 527 U/L — ABNORMAL HIGH (ref 98–192)

## 2020-01-27 LAB — MAGNESIUM: Magnesium: 3.2 mg/dL — ABNORMAL HIGH (ref 1.7–2.4)

## 2020-01-27 MED ORDER — AMIODARONE IV BOLUS ONLY 150 MG/100ML
150.0000 mg | Freq: Once | INTRAVENOUS | Status: AC
  Administered 2020-01-27: 150 mg via INTRAVENOUS

## 2020-01-27 MED ORDER — FREE WATER
400.0000 mL | Status: DC
  Administered 2020-01-27 – 2020-01-29 (×10): 400 mL

## 2020-01-27 NOTE — Progress Notes (Signed)
NAME:  Alisha Stephenson, MRN:  [id number], DOB:  1965-10-25, LOS: 2 ADMISSION DATE:  01/14/2020, CONSULTATION DATE:  8/3 REFERRING MD:  Alfredia Ferguson, CHIEF COMPLAINT:  Dyspnea   Brief History   54 y/o female admitted on 8/2 with severe acute respiratory failure with hypoxemia due to COVID 19 pneumonia.  She developed symptoms 1 week prior to admission.  Past Medical History  DM2 Diverticulitis Gallstones Ovarian cyst NAFLD Asthma  Significant Hospital Events   8/2 admit 8/3 ICU transfer, intubated 8/4 prone, paralyze 8/9 significant desaturations today 8/9 VV ECMO cannulation 8/13 head bleed  Consults:  PCCM ECMO team    Procedures:  8/3 ETT >  8/3 PICC >  8/9 LIJ MML 8/9 RIJ [place] 32F   Significant Diagnostic Tests:  7/31 CT head > NAICP 7/31 MRI/MRA brain > no acute changes, possibly small aneurysm ACOM 8/13 CT head> multiple areas of ICH  Micro Data:  8/2 blood > NG 8/2 SARS COV 2 > positive 8/4 resp > negative 8/4 urine >  8/12 blood > E. faecalis (pan-sensitive) 8/12 resp: staph aureus> MSSA  Antimicrobials:  8/2 remdesivir > 8/6 8/2 actemra  8/2 solumedrol >   8/3 ceftriaxone >  8/5 8/3 azithro >  8/5  8/12 meropenem>8/16 8/12 vanc>8/16  8/13 zosyn >> 8/20 (planned) Vanc 8/19>> Fluconazole 8/19>>  Interim history/subjective:  Following commands per nursing Not following commands for me. Remains extubated, gasping for air.  Objective   Blood pressure (!) 103/51, pulse 93, temperature (!) 96.6 F (35.9 C), resp. rate 15, height 5\' 4"  (1.626 m), weight (!) 151.1 kg, SpO2 (!) 87 %. CVP:  [23 mmHg] 23 mmHg  FiO2 (%):  [100 %] 100 %   Intake/Output Summary (Last 24 hours) at 01/27/2020 1102 Last data filed at 01/27/2020 1000 Gross per 24 hour  Intake 4772.73 ml  Output 3000 ml  Net 1772.73 ml   Filed Weights   01/25/20 0400 01/26/20 0418 01/27/20 0437  Weight: (!) 147 kg (!) 150.4 kg (!) 151.1 kg    Examination:  GEN: ill appearing  woman laying in bed HEENT: RIJ ecmo cannula CDI, minimal oozing CV: RRR, ext warm PULM: guppy breathing, poor air movement GI: Soft, +BS EXT: trace global anasarca NEURO: withdraws to pain for me PSYCH: cannot assess SKIN: no rashes  Labs reviewed CXR whiteout bilaterally   Resolved Hospital Problem list     Assessment & Plan:  Acute hypoxic and AoC hypercapneic respiratory failure; likely underly OHS (baseline bicarb 27-30) ARDS due to COVID 19 pneumonia   VV ECMO cannulation on 8/9 for refractory hypoxemia MSSA pneumonia Enterococcal bacteremia AKI; severe azotemia Elevated LDH, hypo-fibrinogenemia and thrombocytopenia; most likely due to sepsis, ?liver dx likely. Hb stable and low platelets but no schistocytes on smear suggest DIC is not cause.   ICH, multifocal. L-sided weakness.  Encephalopathy- ICU delirium + ischemic injury + azotemia - Reorient as able - Intubation will not significantly help O2 saturations, keep extubated if at all possible - Very coagulopathic, holding on [facility] for now given good flows in circuit - Hold on diuresis today, personally I do not think she is a dialysis candidate given comorbidities  Shock related to sedation and RV stunning- milrinone and PRN norepinephrine  Hyperglycemia > not controlled at all with [place] insulin despite aggressive upward titration.  Assume poor absorption, potentially due to subcutaneous edema.    -Remains on insulin infusion -Goal BG 140-180 while admitted to the ICU -Continue linagliptin to decrease insulin requirements -  Need to work on this today, sugars in 300s  Constipation, resolved  Hypernatremia  - Increase FWF, goal Na 145-150  Acute anemia due to critical illness -transfuse for Hb <8 -con't to monitor   Increased WBC, hypotension and question sepsis 8/19: started on vanc, fluconazole.  Cultures negative to date.  Will discuss de-escalation tomorrow depending on temp curve   Best practice:  Diet: tube  feeding Pain/Anxiety/Delirium protocol (if indicated): as above VAP protocol (if indicated): yes DVT prophylaxis: SCDs while bivalirudin off GI prophylaxis: famotidine Glucose control:  insulin infusion, need to increase this Mobility: bed rest Code Status: limited, intubation okay but no shocks or compressions Family Communication: will call Disposition: ICU  The patient is critically ill with multiple organ systems failure and requires high complexity decision making for assessment and support, frequent evaluation and titration of therapies, application of advanced monitoring technologies and extensive interpretation of multiple databases. Critical Care Time devoted to patient care services described in this note independent of APP/resident time (if applicable)  is 46 minutes.   Erskine Emery MD [place] [facility] 01/27/2020 11:02 AM Personal pager: [phone] If unanswered, please page CCM On-call: [phone]

## 2020-01-27 NOTE — Progress Notes (Signed)
[facility] PCCM; [place] COVID-19 Lead Physician  I am familiar with Alisha Stephenson condition because I intubated her while she was at [facility] long hospital and provided care for her in that setting.  Since her initial diagnosis and admission she has been treated for Covid pneumonia and severe ARDS initially with full mechanical ventilatory support.  She had to transition to [facility] for ECMO treatment given severe hypoxemia.  Since hospitalization she has suffered from an intracranial hemorrhage.  She was extubated and has followed some commands periodically but has made very little respiratory effort and remains completely dependent on ECMO at this time.  The primary team's concern is that given her generalized weakness, her profound thrombocytopenia and undetectable fibrinogen levels that should she undergo another cardiac arrest the intervention of CPR would not provide medical benefit and would be harmful. I agree with a DNR CODE STATUS and current management as per the cardiology and pulmonary critical care teams.  Roselie Awkward, MD [place] [facility]M Pager: [phone] Cell: [phone] If no response, call [phone]

## 2020-01-27 NOTE — CV Procedure (Signed)
ECMO NOTE:  Indication: Respiratory failure due to COVID PNA  Initial cannulation date: 01/07/2020  ECMO type: VV ECMO (Centrimag with oximizer)  Dual lumen Inflow/return cannula: 32 FR RIJ [place] placed 01/30/2020  ECMO events:  - Initial cannulation 02/05/2020 - Cannula repositioned (pulled back ) 01/17/20 - Off bival 8/13 due to [place]  Daily data:  Flow5.3 RPM 4200 Sweep12  Labs:  ABG good, pAO2 45 Sodium remains on higher side (goal 145-155 given cerebral edema) Sugars remain up BUN/Cr worse LDH stable in 500s Fibrinogen back down below 60 INR 2.5>>1.9 PTT 43>>67 HgB 8.5>>8.2>>8.7 Plts 34>>37 WBC 13>>11   Assessment: -Severe COVID ARDS s/p VA ECMO -Enterococcal bacteremia -MSSA pneumonia -Thrombocytopenia- some combination of hemolysis, bone marrow stunning, and sepsis effect - Muscular deconditioning  Plan: -Continue VV ECMO support -Hold on intubation and trach for now -Hold on [facility] for now, good flows, LDH stable, circuit clots stable on exam -Diuretic holiday -Increase insulin -Need to discuss final abx plan tomorrow AM -AC and diuresis per discussion with ECMO and CHF teams -PT & OT consults. -Limited code, no signs of progress, daily discussions with family  Erskine Emery MD PCCM

## 2020-01-27 NOTE — Progress Notes (Signed)
Assisted tele visit to patient with husband and daughter.  Carrianne Hyun DNP eLink RN

## 2020-01-27 NOTE — Progress Notes (Signed)
      INFECTIOUS DISEASE ATTENDING ADDENDUM:   Date: 01/27/2020  Patient name: Alisha Stephenson  Medical record number: [id number]  Date of birth: 11/15/1965   Patients cultures reviewed (blood) and there is no growth.  I do not see indication for vancomycin and antifungal therapy but OK to continue for now.  If concern for septic emboli to CNS would consider TEE.  Of not zosyn is not good at crossing BBB. Vancomycin will though if using targetted therapy would move to unasyn at some point soon   [employer] 01/27/2020, 2:43 PM

## 2020-01-27 NOTE — Progress Notes (Signed)
57mL fentanyl wasted and witnessed with Cyd Silence, RN in stericycle.

## 2020-01-27 NOTE — Progress Notes (Signed)
[facility] Progress Note Patient Name: Alisha Stephenson DOB: [birth year] MRN: [id number]   Date of Service  01/27/2020  HPI/Events of Note  AFIB with RVR - Ventricular rate = 130-140. Currently on Amiodarone IV infusion. Last K+ = 4.7.  eICU Interventions  Plan: 1. Amiodarone 150 mg IV over 1o minutes now.  2. Magnesium level STAT.      Intervention Category Major Interventions: Arrhythmia - evaluation and management  Melani Brisbane [place] 01/27/2020, 9:05 PM

## 2020-01-27 NOTE — Progress Notes (Signed)
Advanced Heart Failure Rounding Note   Subjective:    - 8/2 COVID + test - 8/9 Cannulated for VV ECMO - 8/13 with several areas of intracranial hemorrhage. Bival stopped.  - 8/14 CT no change in [place]. Increased edema - 8/16 Extubated - 8/16 Head CT stable bleed  Awake. Following commands today. On milrinone 0.125. Off NE for now   BUN/CR continue to rise.   CXR diffuse severe infiltrates. No change Personally reviewed  PLTs 31K -> 38k -> 37k . Fibrinogen < 60  ECMO   Flow 5.3L RPM 4200 Sweep12  Labs:  7.45/36/45/86% Hgb8.7 PLT 37k LDH 429 -> 451 -> 576 -> 527 PTT => off bival Lactic acid1.6-> 1.5 -> 2.0 -> 1.7 -> 3.5 -> 2.8 -> 1.9 Fibrinogen < 60  Objective:   Weight Range:  Vital Signs:   Temp:  [95.5 F (35.3 C)-96.8 F (36 C)] 96.3 F (35.7 C) (08/21 0745) Pulse Rate:  [74-138] 91 (08/21 0745) Resp:  [0-17] 0 (08/21 0630) BP: (134-148)/(55-57) 134/57 (08/20 1512) SpO2:  [77 %-96 %] 86 % (08/21 0745) Arterial Line BP: (98-170)/(44-68) 125/61 (08/21 0745) FiO2 (%):  [100 %] 100 % (08/21 0545) Weight:  [151.1 kg] 151.1 kg (08/21 0437) Last BM Date: 01/26/20  Weight change: Filed Weights   01/25/20 0400 01/26/20 0418 01/27/20 0437  Weight: (!) 147 kg (!) 150.4 kg (!) 151.1 kg    Intake/Output:   Intake/Output Summary (Last 24 hours) at 01/27/2020 0832 Last data filed at 01/27/2020 0700 Gross per 24 hour  Intake 4531.45 ml  Output 3000 ml  Net 1531.45 ml     Physical Exam: General:  Awake on vent. Will  follow commands HEENT: normal Neck: supple. RIJ ECMO cannula ok Cor: PMI nondisplaced. Regular rate & rhythm. No rubs, gallops or murmurs. Lungs: tachypneic guppy breathing Abdomen: obese soft, nontender, nondistended. Extremities: no cyanosis, clubbing, rash, 2+ edema Neuro: Awake on vent. Will  follow commands LUE appears weak   Telemetry: Sinus 90-100 Personally reviewed   Labs: Basic Metabolic Panel: Recent Labs  Lab  01/23/20 0436 01/23/20 0850 01/25/20 0410 01/25/20 0421 01/25/20 1730 01/25/20 1950 01/26/20 0401 01/26/20 0403 01/26/20 1706 01/26/20 1710 01/26/20 1957 01/27/20 0410 01/27/20 0412  NA 148*   < > 154*   < > 152*   < > 149*   < > 150* 152* 151* 151* 149*  K 4.1   < > 4.7   < > 5.8*   < > 5.1   < > 4.6 4.8 4.7 5.0 4.9  CL 106   < > 114*  --  112*  --  113*  --   --  113*  --  111  --   CO2 31   < > 30  --  26  --  25  --   --  23  --  25  --   GLUCOSE 286*   < > 168*  --  261*  --  188*  --   --  340*  --  368*  --   BUN 97*   < > 117*  --  150*  --  154*  --   --  170*  --  171*  --   CREATININE 1.13*   < > 1.04*  --  1.48*  --  1.64*  --   --  1.87*  --  1.81*  --   CALCIUM 8.9   < > 9.3   < > 9.1   < >  8.9  --   --  9.1  --  9.1  --   MG 2.9*  --   --   --   --   --   --   --   --   --   --   --   --    < > = values in this interval not displayed.    Liver Function Tests: Recent Labs  Lab 01/23/20 0436 01/24/20 0400 01/25/20 0410 01/26/20 0401 01/27/20 0410  AST 49* 35 35 52* 40  ALT 64* 57* 53* 61* 61*  ALKPHOS 133* 124 111 151* 135*  BILITOT 1.9* 1.9* 1.7* 1.7* 2.0*  PROT 5.5* 5.1* 5.3* 5.2* 5.0*  ALBUMIN 3.9 3.4* 3.3* 3.1* 2.8*   No results for input(s): LIPASE, AMYLASE in the last 168 hours. No results for input(s): AMMONIA in the last 168 hours.  CBC: Recent Labs  Lab 01/25/20 0410 01/25/20 0421 01/25/20 1730 01/25/20 1950 01/26/20 0401 01/26/20 0403 01/26/20 1706 01/26/20 1710 01/26/20 1957 01/27/20 0410 01/27/20 0412  WBC 7.8  --  16.5*  --  12.4*  --   --  13.3*  --  11.8*  --   HGB 8.0*   < > 7.7*   < > 8.7*   < > 8.5* 8.5* 8.8* 8.7* 8.2*  HCT 27.2*   < > 27.3*   < > 29.3*   < > 25.0* 29.2* 26.0* 28.1* 24.0*  MCV 102.3*  --  106.6*  --  102.4*  --   --  102.1*  --  101.1*  --   PLT 39*  --  45*  --  33*  --   --  34*  --  37*  --    < > = values in this interval not displayed.    Cardiac Enzymes: No results for input(s): CKTOTAL, CKMB,  CKMBINDEX, TROPONINI in the last 168 hours.  BNP: BNP (last 3 results) No results for input(s): BNP in the last 8760 hours.  ProBNP (last 3 results) No results for input(s): PROBNP in the last 8760 hours.    Other results:  Imaging: DG CHEST PORT 1 VIEW  Result Date: 01/26/2020 CLINICAL DATA:  Hypoxia EXAM: PORTABLE CHEST 1 VIEW COMPARISON:  January 25, 2020 FINDINGS: ECMO catheter tip is below the diaphragm. Feeding tube tip is below the diaphragm. Left jugular catheter tip is in the superior vena cava. No pneumothorax. Diffuse opacification of the lungs bilaterally with likely superimposed pleural effusions noted. Heart is obscured by diffuse airspace opacity. Grossly stable cardiac silhouette. Postoperative change noted in the lower cervical spine. IMPRESSION: Tube and catheter positions as described without appreciable pneumothorax. Diffuse airspace opacity throughout the lungs with likely superimposed pleural effusions noted. Grossly stable cardiac silhouette. Electronically Signed   By: Lowella Grip III M.D.   On: 01/26/2020 08:05     Medications:     Scheduled Medications: . sodium chloride   Intravenous Once  . vitamin C  500 mg Per Tube Daily  . chlorhexidine gluconate (MEDLINE KIT)  15 mL Mouth Rinse BID  . Chlorhexidine Gluconate Cloth  6 each Topical Daily  . clonazePAM  1 mg Per Tube Q6H  . docusate  100 mg Per Tube BID  . etomidate  40 mg Intravenous Once  . feeding supplement (PROSource TF)  45 mL Per Tube BID  . fentaNYL (SUBLIMAZE) injection  200 mcg Intravenous Once  . fentaNYL (SUBLIMAZE) injection  50 mcg Intravenous Once  . free water  200 mL Per Tube Q4H  . linagliptin  5 mg Per Tube Daily  . mouth rinse  15 mL Mouth Rinse 10 times per day  . midazolam  5 mg Intravenous Once  . nutrition supplement (JUVEN)  1 packet Per Tube BID BM  . oxyCODONE  10 mg Per Tube Q6H  . pantoprazole sodium  40 mg Per Tube Daily  . polyethylene glycol  17 g Per Tube  Daily  . QUEtiapine  100 mg Per Tube BID  . sennosides  5 mL Per Tube BID  . sodium chloride flush  10-40 mL Intracatheter Q12H  . vecuronium  10 mg Intravenous Once  . zinc sulfate  220 mg Per Tube Daily    Infusions: . sodium chloride 250 mL (01/21/20 1227)  . albumin human 12.5 g (01/24/20 2327)  . amiodarone 30 mg/hr (01/27/20 0700)  . dexmedetomidine (PRECEDEX) IV infusion Stopped (01/27/20 0647)  . feeding supplement (PIVOT 1.5 CAL) 1,000 mL (01/27/20 0825)  . fentaNYL infusion INTRAVENOUS Stopped (01/27/20 0057)  . fluconazole (DIFLUCAN) IV Stopped (01/26/20 1715)  . insulin 11.5 mL/hr at 01/27/20 0700  . labetalol (NORMODYNE) infusion 5 mg/mL Stopped (01/24/20 0505)  . milrinone 0.125 mcg/kg/min (01/27/20 0700)  . norepinephrine (LEVOPHED) Adult infusion Stopped (01/27/20 0338)  . piperacillin-tazobactam (ZOSYN)  IV Stopped (01/27/20 0603)  . vancomycin 1,000 mg (01/27/20 0810)  . vasopressin Stopped (01/26/20 0238)    PRN Medications: sodium chloride, acetaminophen (TYLENOL) oral liquid 160 mg/5 mL, albumin human, albuterol, dextrose, fentaNYL, fentaNYL, guaiFENesin-dextromethorphan, hydrALAZINE, hydrOXYzine, ondansetron **OR** ondansetron (ZOFRAN) IV, sodium chloride flush   Assessment/Plan:   1. Acute hypoxic/hypercapneic respiratory failure in setting of severe COVID PNA/ARDS -> VV ECMO - admit 8/2 - intubation 8/3 - has received actmera (compelted 8/2), remdesivir (completed 8/6) and steroids - failed full vent support with proning/paralytic - Cannulated for VV ECMO on 8/9 - Extubated 8/16 - Stable overnight. Awake and following most commands. More calm - Circuit stable without AC - CXR unchanged - Off bival due to [place].  - ECMO circuit with fibrin and some clots but functioning ok off anticoagulation for now. May need to start low-dose heparin soon. Would not tolerate circuit change due to instability - Renal functioning worsening today. Hold lasix.   2.  Enterococcus sepsis - continue vanc + high dose zosyn per pharmacy  - No change  3. Intracranial hemorrhage - ? Septic emboli - repeat head CT on 8/14 with stable bleeds but increased edema - neurology following. Suspect significant long-term injury sustained. Will follow commands. Appears to have dense LUE weakness and possibly LLE - repeat head CT stable 8/16 - No AC with ICH for now - Consider TEE at some point to further evalaute. Too unstable now  4. Thrombocytopenia - suspect due to sepsis.  - no obvious bleeding - would not transfuse PLTs unless absolutely necessary given risk of clotting pump - PLTs remain < 35k  4. Morbid obesity - Body mass index is 49.38 kg/m.  5. Poorly controlled DM2 - HgBA1c 10.7 - CBGs remain elevated - On IV insulin adjust as needed  6. Hypernatremia - Na 149. On FW  7. Lactic acidosis -  Improved today  8. PAF - back in NSR on IV amio this am. Continue   9. AKI/azotemia - hold diuretics today   CRITICAL CARE Performed by: Glori Bickers  Total critical care time: 40 minutes  Critical care time was exclusive of separately billable procedures and treating other patients.  Critical  care was necessary to treat or prevent imminent or life-threatening deterioration.  Critical care was time spent personally by me (independent of midlevel providers or residents) on the following activities: development of treatment plan with patient and/or surrogate as well as nursing, discussions with consultants, evaluation of patient's response to treatment, examination of patient, obtaining history from patient or surrogate, ordering and performing treatments and interventions, ordering and review of laboratory studies, ordering and review of radiographic studies, pulse oximetry and re-evaluation of patient's condition.   Length of Stay: 19   Glori Bickers  MD 01/27/2020, 8:32 AM  Advanced Heart Failure Team Pager [phone] (M-F; 7a - 4p)   Please contact [facility] for night-coverage after hours (4p -7a ) and weekends on [email or web address]

## 2020-01-28 ENCOUNTER — Inpatient Hospital Stay (HOSPITAL_COMMUNITY): Payer: PRIVATE HEALTH INSURANCE

## 2020-01-28 DIAGNOSIS — B9561 Methicillin susceptible Staphylococcus aureus infection as the cause of diseases classified elsewhere: Secondary | ICD-10-CM

## 2020-01-28 DIAGNOSIS — B952 Enterococcus as the cause of diseases classified elsewhere: Secondary | ICD-10-CM

## 2020-01-28 DIAGNOSIS — R7881 Bacteremia: Secondary | ICD-10-CM

## 2020-01-28 DIAGNOSIS — Z9989 Dependence on other enabling machines and devices: Secondary | ICD-10-CM

## 2020-01-28 DIAGNOSIS — J9621 Acute and chronic respiratory failure with hypoxia: Secondary | ICD-10-CM

## 2020-01-28 DIAGNOSIS — J1282 Pneumonia due to coronavirus disease 2019: Secondary | ICD-10-CM

## 2020-01-28 LAB — BASIC METABOLIC PANEL
Anion gap: 12 (ref 5–15)
Anion gap: 17 — ABNORMAL HIGH (ref 5–15)
BUN: 188 mg/dL — ABNORMAL HIGH (ref 6–20)
BUN: 200 mg/dL — ABNORMAL HIGH (ref 6–20)
CO2: 26 mmol/L (ref 22–32)
CO2: 22 mmol/L (ref 22–32)
Calcium: 9.3 mg/dL (ref 8.9–10.3)
Calcium: 9.1 mg/dL (ref 8.9–10.3)
Chloride: 110 mmol/L (ref 98–111)
Chloride: 110 mmol/L (ref 98–111)
Creatinine, Ser: 1.87 mg/dL — ABNORMAL HIGH (ref 0.44–1.00)
Creatinine, Ser: 1.9 mg/dL — ABNORMAL HIGH (ref 0.44–1.00)
GFR calc Af Amer: 34 mL/min — ABNORMAL LOW (ref >60)
GFR calc Af Amer: 34 mL/min — ABNORMAL LOW (ref >60)
GFR calc non Af Amer: 30 mL/min — ABNORMAL LOW (ref >60)
GFR calc non Af Amer: 29 mL/min — ABNORMAL LOW (ref >60)
Glucose, Bld: 308 mg/dL — ABNORMAL HIGH (ref 70–99)
Glucose, Bld: 207 mg/dL — ABNORMAL HIGH (ref 70–99)
Potassium: 4.8 mmol/L (ref 3.5–5.1)
Potassium: 5.9 mmol/L — ABNORMAL HIGH (ref 3.5–5.1)
Sodium: 148 mmol/L — ABNORMAL HIGH (ref 135–145)
Sodium: 149 mmol/L — ABNORMAL HIGH (ref 135–145)

## 2020-01-28 LAB — POCT I-STAT 7, (LYTES, BLD GAS, ICA,H+H)
Acid-Base Excess: 0 mmol/L (ref 0.0–2.0)
Acid-Base Excess: 2 mmol/L (ref 0.0–2.0)
Acid-Base Excess: 1 mmol/L (ref 0.0–2.0)
Acid-Base Excess: 3 mmol/L — ABNORMAL HIGH (ref 0.0–2.0)
Acid-Base Excess: 2 mmol/L (ref 0.0–2.0)
Bicarbonate: 26.4 mmol/L (ref 20.0–28.0)
Bicarbonate: 27.5 mmol/L (ref 20.0–28.0)
Bicarbonate: 27.2 mmol/L (ref 20.0–28.0)
Bicarbonate: 28.1 mmol/L — ABNORMAL HIGH (ref 20.0–28.0)
Bicarbonate: 27.3 mmol/L (ref 20.0–28.0)
Calcium, Ion: 1.29 mmol/L (ref 1.15–1.40)
Calcium, Ion: 1.28 mmol/L (ref 1.15–1.40)
Calcium, Ion: 1.27 mmol/L (ref 1.15–1.40)
Calcium, Ion: 1.25 mmol/L (ref 1.15–1.40)
Calcium, Ion: 1.25 mmol/L (ref 1.15–1.40)
HCT: 24 % — ABNORMAL LOW (ref 36.0–46.0)
HCT: 24 % — ABNORMAL LOW (ref 36.0–46.0)
HCT: 24 % — ABNORMAL LOW (ref 36.0–46.0)
HCT: 22 % — ABNORMAL LOW (ref 36.0–46.0)
HCT: 24 % — ABNORMAL LOW (ref 36.0–46.0)
Hemoglobin: 8.2 g/dL — ABNORMAL LOW (ref 12.0–15.0)
Hemoglobin: 8.2 g/dL — ABNORMAL LOW (ref 12.0–15.0)
Hemoglobin: 8.2 g/dL — ABNORMAL LOW (ref 12.0–15.0)
Hemoglobin: 7.5 g/dL — ABNORMAL LOW (ref 12.0–15.0)
Hemoglobin: 8.2 g/dL — ABNORMAL LOW (ref 12.0–15.0)
O2 Saturation: 86 %
O2 Saturation: 83 %
O2 Saturation: 81 %
O2 Saturation: 85 %
O2 Saturation: 90 %
Patient temperature: 35.5
Patient temperature: 36.5
Patient temperature: 37.1
Patient temperature: 36
Potassium: 4.5 mmol/L (ref 3.5–5.1)
Potassium: 4.8 mmol/L (ref 3.5–5.1)
Potassium: 5.1 mmol/L (ref 3.5–5.1)
Potassium: 5.6 mmol/L — ABNORMAL HIGH (ref 3.5–5.1)
Potassium: 4.9 mmol/L (ref 3.5–5.1)
Sodium: 150 mmol/L — ABNORMAL HIGH (ref 135–145)
Sodium: 151 mmol/L — ABNORMAL HIGH (ref 135–145)
Sodium: 150 mmol/L — ABNORMAL HIGH (ref 135–145)
Sodium: 150 mmol/L — ABNORMAL HIGH (ref 135–145)
Sodium: 148 mmol/L — ABNORMAL HIGH (ref 135–145)
TCO2: 28 mmol/L (ref 22–32)
TCO2: 29 mmol/L (ref 22–32)
TCO2: 29 mmol/L (ref 22–32)
TCO2: 29 mmol/L (ref 22–32)
TCO2: 29 mmol/L (ref 22–32)
pCO2 arterial: 50.7 mmHg — ABNORMAL HIGH (ref 32.0–48.0)
pCO2 arterial: 45.7 mmHg (ref 32.0–48.0)
pCO2 arterial: 48.4 mmHg — ABNORMAL HIGH (ref 32.0–48.0)
pCO2 arterial: 46.7 mmHg (ref 32.0–48.0)
pCO2 arterial: 42.1 mmHg (ref 32.0–48.0)
pH, Arterial: 7.325 — ABNORMAL LOW (ref 7.350–7.450)
pH, Arterial: 7.381 (ref 7.350–7.450)
pH, Arterial: 7.356 (ref 7.350–7.450)
pH, Arterial: 7.387 (ref 7.350–7.450)
pH, Arterial: 7.416 (ref 7.350–7.450)
pO2, Arterial: 56 mmHg — ABNORMAL LOW (ref 83.0–108.0)
pO2, Arterial: 46 mmHg — ABNORMAL LOW (ref 83.0–108.0)
pO2, Arterial: 47 mmHg — ABNORMAL LOW (ref 83.0–108.0)
pO2, Arterial: 52 mmHg — ABNORMAL LOW (ref 83.0–108.0)
pO2, Arterial: 56 mmHg — ABNORMAL LOW (ref 83.0–108.0)

## 2020-01-28 LAB — HEPATIC FUNCTION PANEL
ALT: 61 U/L — ABNORMAL HIGH (ref 0–44)
AST: 35 U/L (ref 15–41)
Albumin: 2.8 g/dL — ABNORMAL LOW (ref 3.5–5.0)
Alkaline Phosphatase: 140 U/L — ABNORMAL HIGH (ref 38–126)
Bilirubin, Direct: 0.3 mg/dL — ABNORMAL HIGH (ref 0.0–0.2)
Indirect Bilirubin: 1.1 mg/dL — ABNORMAL HIGH (ref 0.3–0.9)
Total Bilirubin: 1.4 mg/dL — ABNORMAL HIGH (ref 0.3–1.2)
Total Protein: 5.3 g/dL — ABNORMAL LOW (ref 6.5–8.1)

## 2020-01-28 LAB — CBC
HCT: 28.9 % — ABNORMAL LOW (ref 36.0–46.0)
HCT: 26.3 % — ABNORMAL LOW (ref 36.0–46.0)
Hemoglobin: 8.8 g/dL — ABNORMAL LOW (ref 12.0–15.0)
Hemoglobin: 7.7 g/dL — ABNORMAL LOW (ref 12.0–15.0)
MCH: 31.9 pg (ref 26.0–34.0)
MCH: 30.3 pg (ref 26.0–34.0)
MCHC: 30.4 g/dL (ref 30.0–36.0)
MCHC: 29.3 g/dL — ABNORMAL LOW (ref 30.0–36.0)
MCV: 104.7 fL — ABNORMAL HIGH (ref 80.0–100.0)
MCV: 103.5 fL — ABNORMAL HIGH (ref 80.0–100.0)
Platelets: 35 10*3/uL — ABNORMAL LOW (ref 150–400)
Platelets: 30 10*3/uL — ABNORMAL LOW (ref 150–400)
RBC: 2.76 MIL/uL — ABNORMAL LOW (ref 3.87–5.11)
RBC: 2.54 MIL/uL — ABNORMAL LOW (ref 3.87–5.11)
RDW: 23.1 % — ABNORMAL HIGH (ref 11.5–15.5)
RDW: 23.7 % — ABNORMAL HIGH (ref 11.5–15.5)
WBC: 10.8 10*3/uL — ABNORMAL HIGH (ref 4.0–10.5)
WBC: 9.9 10*3/uL (ref 4.0–10.5)
nRBC: 6.7 % — ABNORMAL HIGH (ref 0.0–0.2)
nRBC: 9.6 % — ABNORMAL HIGH (ref 0.0–0.2)

## 2020-01-28 LAB — GLUCOSE, CAPILLARY
Glucose-Capillary: 135 mg/dL — ABNORMAL HIGH (ref 70–99)
Glucose-Capillary: 188 mg/dL — ABNORMAL HIGH (ref 70–99)
Glucose-Capillary: 205 mg/dL — ABNORMAL HIGH (ref 70–99)
Glucose-Capillary: 225 mg/dL — ABNORMAL HIGH (ref 70–99)
Glucose-Capillary: 244 mg/dL — ABNORMAL HIGH (ref 70–99)
Glucose-Capillary: 245 mg/dL — ABNORMAL HIGH (ref 70–99)
Glucose-Capillary: 247 mg/dL — ABNORMAL HIGH (ref 70–99)
Glucose-Capillary: 255 mg/dL — ABNORMAL HIGH (ref 70–99)
Glucose-Capillary: 257 mg/dL — ABNORMAL HIGH (ref 70–99)
Glucose-Capillary: 279 mg/dL — ABNORMAL HIGH (ref 70–99)
Glucose-Capillary: 323 mg/dL — ABNORMAL HIGH (ref 70–99)

## 2020-01-28 LAB — PROTIME-INR
INR: 2 — ABNORMAL HIGH (ref 0.8–1.2)
Prothrombin Time: 22 seconds — ABNORMAL HIGH (ref 11.4–15.2)

## 2020-01-28 LAB — PATHOLOGIST SMEAR REVIEW

## 2020-01-28 LAB — LACTATE DEHYDROGENASE: LDH: 547 U/L — ABNORMAL HIGH (ref 98–192)

## 2020-01-28 LAB — LACTIC ACID, PLASMA
Lactic Acid, Venous: 2.9 mmol/L (ref 0.5–1.9)
Lactic Acid, Venous: 2 mmol/L (ref 0.5–1.9)

## 2020-01-28 LAB — FIBRINOGEN: Fibrinogen: <60 mg/dL — CL (ref 210–475)

## 2020-01-28 LAB — APTT
aPTT: 41 seconds — ABNORMAL HIGH (ref 24–36)
aPTT: 48 seconds — ABNORMAL HIGH (ref 24–36)

## 2020-01-28 MED ORDER — SODIUM CHLORIDE 0.9 % IV SOLN
2.0000 g | Freq: Four times a day (QID) | INTRAVENOUS | Status: DC
  Administered 2020-01-28: 2 g via INTRAVENOUS
  Filled 2020-01-28: qty 2000
  Filled 2020-01-28: qty 2

## 2020-01-28 MED ORDER — PRISMASOL BGK 0/2.5 32-2.5 MEQ/L IV SOLN
INTRAVENOUS | Status: DC
  Filled 2020-01-28 (×16): qty 5000

## 2020-01-28 MED ORDER — SODIUM CHLORIDE 0.9% IV SOLUTION: Freq: Once | INTRAVENOUS | Status: AC

## 2020-01-28 MED ORDER — HEPARIN SODIUM (PORCINE) 1000 UNIT/ML DIALYSIS
1000.0000 [IU] | INTRAMUSCULAR | Status: DC | PRN
  Administered 2020-01-28: 2400 [IU] via INTRAVENOUS_CENTRAL
  Administered 2020-02-12: 1200 [IU] via INTRAVENOUS_CENTRAL
  Filled 2020-01-28: qty 6
  Filled 2020-01-28: qty 2
  Filled 2020-01-28 (×3): qty 6

## 2020-01-28 MED ORDER — ALBUMIN HUMAN 25 % IV SOLN: 12.5000 g | Freq: Once | INTRAVENOUS | Status: DC

## 2020-01-28 MED ORDER — SODIUM CHLORIDE 0.9 % IV SOLN
2.0000 g | Freq: Two times a day (BID) | INTRAVENOUS | Status: DC
  Administered 2020-01-28 (×2): 2 g via INTRAVENOUS
  Filled 2020-01-28: qty 20
  Filled 2020-01-28: qty 2
  Filled 2020-01-28: qty 20
  Filled 2020-01-28: qty 2

## 2020-01-28 MED ORDER — PRISMASOL BGK 0/2.5 32-2.5 MEQ/L IV SOLN
INTRAVENOUS | Status: DC
  Filled 2020-01-28 (×10): qty 5000

## 2020-01-28 MED ORDER — SODIUM CHLORIDE 0.9 % IV SOLN
2.0000 g | INTRAVENOUS | Status: DC
  Administered 2020-01-28 – 2020-01-29 (×5): 2 g via INTRAVENOUS
  Filled 2020-01-28 (×2): qty 2000
  Filled 2020-01-28: qty 2
  Filled 2020-01-28: qty 2000
  Filled 2020-01-28: qty 2
  Filled 2020-01-28 (×3): qty 2000

## 2020-01-28 MED ORDER — SODIUM CHLORIDE 0.9 % FOR CRRT: 500.0000 mL | INTRAVENOUS_CENTRAL | Status: DC | PRN

## 2020-01-28 MED ORDER — ALBUMIN HUMAN 5 % IV SOLN
12.5000 g | Freq: Once | INTRAVENOUS | Status: AC
  Administered 2020-02-28 – 2020-02-29 (×2): 12.5 g via INTRAVENOUS
  Filled 2020-01-28: qty 250

## 2020-01-28 MED ORDER — PRISMASOL BGK 0/2.5 32-2.5 MEQ/L IV SOLN
INTRAVENOUS | Status: DC
  Filled 2020-01-28 (×37): qty 5000

## 2020-01-28 MED ORDER — ALBUMIN HUMAN 25 % IV SOLN
12.5000 g | Freq: Once | INTRAVENOUS | Status: AC
  Administered 2020-01-28: 12.5 g via INTRAVENOUS

## 2020-01-28 NOTE — Progress Notes (Addendum)
NAME:  Alisha Stephenson, MRN:  [id number], DOB:  06-28-1965, LOS: 6 ADMISSION DATE:  02/04/2020, CONSULTATION DATE:  8/3 REFERRING MD:  Alfredia Ferguson, CHIEF COMPLAINT:  Dyspnea   Brief History   54 y/o female admitted on 8/2 with severe acute respiratory failure with hypoxemia due to COVID 19 pneumonia.  She developed symptoms 1 week prior to admission.  Past Medical History  DM2 Diverticulitis Gallstones Ovarian cyst NAFLD Asthma  Significant Hospital Events   8/2 admit 8/3 ICU transfer, intubated 8/4 prone, paralyze 8/9 significant desaturations today 8/9 VV ECMO cannulation 8/13 head bleed  Consults:  PCCM ECMO team    Procedures:  8/3 ETT >  8/3 PICC >  8/9 LIJ MML 8/9 RIJ [place] 79F   Significant Diagnostic Tests:  7/31 CT head > NAICP 7/31 MRI/MRA brain > no acute changes, possibly small aneurysm ACOM 8/13 CT head> multiple areas of ICH  Micro Data:  8/2 blood > NG 8/2 SARS COV 2 > positive 8/4 resp > negative 8/4 urine >  8/12 blood > E. faecalis (pan-sensitive) 8/12 resp: staph aureus> MSSA  Antimicrobials:  8/2 remdesivir > 8/6 8/2 actemra  8/2 solumedrol >   8/3 ceftriaxone >  8/5 8/3 azithro >  8/5  8/12 meropenem>8/16 8/12 vanc>8/16  8/13 zosyn >> 8/20 (planned) Vanc 8/19>> Fluconazole 8/19>>  Interim history/subjective:  Following commands per nursing Not following commands for me. Remains extubated, gasping for air.  Objective   Blood pressure (!) 149/53, pulse (!) 106, temperature 98.6 F (37 C), resp. rate 20, height 5\' 4"  (1.626 m), weight (!) 152 kg, SpO2 (!) 80 %. CVP:  [19 mmHg-22 mmHg] 19 mmHg  FiO2 (%):  [100 %] 100 %   Intake/Output Summary (Last 24 hours) at 01/28/2020 1415 Last data filed at 01/28/2020 1320 Gross per 24 hour  Intake 4872.4 ml  Output 2010 ml  Net 2862.4 ml   Filed Weights   01/26/20 0418 01/27/20 0437 01/28/20 0500  Weight: (!) 150.4 kg (!) 151.1 kg (!) 152 kg    Examination:  GEN: ill appearing  woman laying in bed HEENT: RIJ ecmo cannula CDI, minimal oozing CV: RRR, ext warm PULM: guppy breathing, poor air movement GI: Soft, +BS EXT: trace global anasarca NEURO: follows commands today PSYCH: cannot assess SKIN: no rashes  Labs reviewed CXR whiteout bilaterally   Resolved Hospital Problem list     Assessment & Plan:  Acute hypoxic and AoC hypercapneic respiratory failure; likely underly OHS (baseline bicarb 27-30) ARDS due to COVID 19 pneumonia   VV ECMO cannulation on 8/9 for refractory hypoxemia MSSA pneumonia Enterococcal bacteremia Elevated LDH, hypo-fibrinogenemia and thrombocytopenia; most likely due to sepsis, ?liver dx likely. Hb stable and low platelets but no schistocytes on smear suggest DIC is not cause.   ICH, multifocal. L-sided weakness.  Encephalopathy- ICU delirium + ischemic injury + azotemia - Reorient as able - Intubation will not significantly help O2 saturations, keep extubated if at all possible - Very coagulopathic, holding on [facility] for now given good flows in circuit  Acute renal failure, severe uremia - Likely start CRRT today after discussion with team  Shock related to sedation and RV stunning- milrinone and PRN norepinephrine  Hyperglycemia > not controlled at all with [place] insulin despite aggressive upward titration.  Assume poor absorption, potentially due to subcutaneous edema.    -Remains on insulin infusion -Goal BG 140-180 while admitted to the ICU -Continue linagliptin to decrease insulin requirements  Constipation, resolved  Hypernatremia  -  Continue FWF, goal Na 145-150  Acute anemia due to critical illness -transfuse for Hb <8 -con't to monitor   Increased WBC, hypotension and question sepsis 8/19, question of septic emboli to explain head bleeds: Recent cultures negative to date.  ID following - Plan is dual beta lactam therapy and d/c vanc and antifungal  Best practice:  Diet: tube feeding Pain/Anxiety/Delirium  protocol (if indicated): as above VAP protocol (if indicated): yes DVT prophylaxis: SCDs while bivalirudin off GI prophylaxis: famotidine Glucose control:  insulin infusion, need to increase this Mobility: bed rest Code Status: limited, intubation okay but no shocks or compressions Family Communication: called by Dr. Missy Sabins Disposition: ICU  The patient is critically ill with multiple organ systems failure and requires high complexity decision making for assessment and support, frequent evaluation and titration of therapies, application of advanced monitoring technologies and extensive interpretation of multiple databases. Critical Care Time devoted to patient care services described in this note independent of APP/resident time (if applicable)  is 32 minutes.   Erskine Emery MD [facility] 01/28/2020 2:15 PM Personal pager: [phone] If unanswered, please page CCM On-call: [phone]

## 2020-01-28 NOTE — CV Procedure (Signed)
Central Venous Catheter Insertion Procedure Note Alisha Stephenson [id number] 1965-10-19    Procedure: Insertion of Central Venous Catheter Indications: Hemodialysis   Procedure Details Consent: Risks of procedure as well as the alternatives and risks of each were explained to the (patient/caregiver).  Consent for procedure obtained. Time Out: Verified patient identification, verified procedure, site/side was marked, verified correct patient position, special equipment/implants available, medications/allergies/relevent history reviewed, required imaging and test results available.  Performed   Maximum sterile technique was used including antiseptics, cap, gloves, gown, hand hygiene, mask and sheet. Skin prep: Chlorhexidine; local anesthetic administered A antimicrobial bonded/coated triple lumen catheter was placed in the left subclavian vein using the Seldinger technique.   Evaluation Blood flow good Complications: No apparent complications Patient did tolerate procedure well. Chest X-ray ordered to verify placement.  CXR: pending   Glori Bickers, MD  2:50 PM

## 2020-01-28 NOTE — Progress Notes (Signed)
Advanced Heart Failure Rounding Note   Subjective:    - 8/2 COVID + test - 8/9 Cannulated for VV ECMO - 8/13 with several areas of intracranial hemorrhage. Bival stopped.  - 8/14 CT no change in [place]. Increased edema - 8/16 Extubated - 8/16 Head CT stable bleed  Awake. Following commands today. On milrinone 0.125. Remains off NE for now   Two episodes of AF with RVR overnight. Got Amio.   BUN/CR continue to rise. Now 188/1.87  Weight up 2 pounds. Na 150 increased free water flushes.   PLTs 31K -> 38k -> 37k -> 35k. Fibrinogen < 60  ECMO   Flow 5.3L RPM 4200 Sweep12 -> 13  Labs:  7.33/51/56/86% Hgb8.8 PLT 35k LDH 429 -> 451 -> 576 -> 527 -> 547 PTT => off bival Lactic acid1.6-> 1.5 -> 2.0 -> 1.7 -> 3.5 -> 2.8 -> 1.9 -> 2.9 Fibrinogen < 60  Objective:   Weight Range:  Vital Signs:   Temp:  [94.8 F (34.9 C)-96.6 F (35.9 C)] 95.2 F (35.1 C) (08/22 0700) Pulse Rate:  [87-132] 88 (08/22 0700) Resp:  [0-39] 0 (08/22 0700) BP: (103-125)/(51-56) 125/56 (08/21 1140) SpO2:  [83 %-90 %] 86 % (08/22 0700) Arterial Line BP: (85-327)/(47-79) 121/49 (08/22 0700) FiO2 (%):  [100 %] 100 % (08/22 0400) Weight:  [152 kg] 152 kg (08/22 0500) Last BM Date: 01/27/20  Weight change: Filed Weights   01/26/20 0418 01/27/20 0437 01/28/20 0500  Weight: (!) 150.4 kg (!) 151.1 kg (!) 152 kg    Intake/Output:   Intake/Output Summary (Last 24 hours) at 01/28/2020 0753 Last data filed at 01/28/2020 0700 Gross per 24 hour  Intake 4776.44 ml  Output 2285 ml  Net 2491.44 ml     Physical Exam: General:  Ill appearing. Tachypneic HEENT: normal Neck: supple.RIJ ECMO cannula Cor: PMI nondisplaced. Regular rate & rhythm. No rubs, gallops or murmurs. Lungs: coarse Abdomen: obese soft, nontender, nondistended.Kermit Balo bowel sounds. Extremities: no cyanosis, clubbing, rash, 2-3+ edema Neuro: awake at times. Will follow some commands. LUE appears weak    Telemetry: Sinus  90-100 Personally reviewed   Labs: Basic Metabolic Panel: Recent Labs  Lab 01/23/20 0436 01/23/20 0850 01/26/20 0401 01/26/20 0403 01/26/20 1710 01/26/20 1957 01/27/20 0410 01/27/20 0412 01/27/20 1505 01/27/20 1816 01/27/20 1954 01/27/20 2221 01/28/20 0308 01/28/20 0528  NA 148*   < > 149*   < > 152*   < > 151*   < > 151* 150* 150*  --  148* 150*  K 4.1   < > 5.1   < > 4.8   < > 5.0   < > 5.3* 4.5 4.7  --  4.8 4.5  CL 106   < > 113*  --  113*  --  111  --  110  --   --   --  110  --   CO2 31   < > 25  --  23  --  25  --  25  --   --   --  26  --   GLUCOSE 286*   < > 188*  --  340*  --  368*  --  457*  --   --   --  308*  --   BUN 97*   < > 154*  --  170*  --  171*  --  183*  --   --   --  188*  --   CREATININE 1.13*   < >  1.64*  --  1.87*  --  1.81*  --  1.77*  --   --   --  1.87*  --   CALCIUM 8.9   < > 8.9   < > 9.1   < > 9.1  --  9.0  --   --   --  9.3  --   MG 2.9*  --   --   --   --   --   --   --   --   --   --  3.2*  --   --    < > = values in this interval not displayed.    Liver Function Tests: Recent Labs  Lab 01/24/20 0400 01/25/20 0410 01/26/20 0401 01/27/20 0410 01/28/20 0308  AST 35 35 52* 40 35  ALT 57* 53* 61* 61* 61*  ALKPHOS 124 111 151* 135* 140*  BILITOT 1.9* 1.7* 1.7* 2.0* 1.4*  PROT 5.1* 5.3* 5.2* 5.0* 5.3*  ALBUMIN 3.4* 3.3* 3.1* 2.8* 2.8*   No results for input(s): LIPASE, AMYLASE in the last 168 hours. No results for input(s): AMMONIA in the last 168 hours.  CBC: Recent Labs  Lab 01/26/20 0401 01/26/20 0403 01/26/20 1710 01/26/20 1957 01/27/20 0410 01/27/20 0412 01/27/20 1505 01/27/20 1816 01/27/20 1954 01/28/20 0308 01/28/20 0528  WBC 12.4*  --  13.3*  --  11.8*  --  11.0*  --   --  10.8*  --   HGB 8.7*   < > 8.5*   < > 8.7*   < > 8.6* 8.2* 8.2* 8.8* 8.2*  HCT 29.3*   < > 29.2*   < > 28.1*   < > 28.8* 24.0* 24.0* 28.9* 24.0*  MCV 102.4*  --  102.1*  --  101.1*  --  103.6*  --   --  104.7*  --   PLT 33*  --  34*  --  37*   --  35*  --   --  35*  --    < > = values in this interval not displayed.    Cardiac Enzymes: No results for input(s): CKTOTAL, CKMB, CKMBINDEX, TROPONINI in the last 168 hours.  BNP: BNP (last 3 results) No results for input(s): BNP in the last 8760 hours.  ProBNP (last 3 results) No results for input(s): PROBNP in the last 8760 hours.    Other results:  Imaging: DG CHEST PORT 1 VIEW  Result Date: 01/27/2020 CLINICAL DATA:  54 year old female with history of cardiorespiratory failure. EXAM: PORTABLE CHEST 1 VIEW COMPARISON:  Chest x-ray 01/26/2020. FINDINGS: ECMO cannula noted. Left internal jugular central venous catheter with tip terminating in the proximal superior vena cava. A feeding tube is seen extending into the abdomen, however, the tip of the feeding tube extends below the lower margin of the image. Complete opacification of the lungs bilaterally obscuring the heart and mediastinal contours. No pneumothorax. Orthopedic fixation hardware throughout the lower cervical spine incidentally noted. IMPRESSION: 1. Support apparatus, as above. 2. Complete opacification of the lungs bilaterally, similar to the prior study. Electronically Signed   By: Vinnie Langton M.D.   On: 01/27/2020 08:33     Medications:     Scheduled Medications: . sodium chloride   Intravenous Once  . vitamin C  500 mg Per Tube Daily  . chlorhexidine gluconate (MEDLINE KIT)  15 mL Mouth Rinse BID  . Chlorhexidine Gluconate Cloth  6 each Topical Daily  . clonazePAM  1 mg Per Tube Q6H  .  docusate  100 mg Per Tube BID  . etomidate  40 mg Intravenous Once  . feeding supplement (PROSource TF)  45 mL Per Tube BID  . fentaNYL (SUBLIMAZE) injection  200 mcg Intravenous Once  . fentaNYL (SUBLIMAZE) injection  50 mcg Intravenous Once  . free water  400 mL Per Tube Q4H  . linagliptin  5 mg Per Tube Daily  . mouth rinse  15 mL Mouth Rinse 10 times per day  . midazolam  5 mg Intravenous Once  . nutrition  supplement (JUVEN)  1 packet Per Tube BID BM  . oxyCODONE  10 mg Per Tube Q6H  . pantoprazole sodium  40 mg Per Tube Daily  . polyethylene glycol  17 g Per Tube Daily  . QUEtiapine  100 mg Per Tube BID  . sennosides  5 mL Per Tube BID  . sodium chloride flush  10-40 mL Intracatheter Q12H  . vecuronium  10 mg Intravenous Once  . zinc sulfate  220 mg Per Tube Daily    Infusions: . sodium chloride 250 mL (01/21/20 1227)  . albumin human 12.5 g (01/24/20 2327)  . amiodarone 30 mg/hr (01/28/20 0700)  . dexmedetomidine (PRECEDEX) IV infusion Stopped (01/27/20 0647)  . feeding supplement (PIVOT 1.5 CAL) 75 mL/hr at 01/28/20 0000  . fentaNYL infusion INTRAVENOUS Stopped (01/27/20 0057)  . fluconazole (DIFLUCAN) IV Stopped (01/27/20 1702)  . insulin 15 Units/hr (01/28/20 0715)  . labetalol (NORMODYNE) infusion 5 mg/mL Stopped (01/24/20 0505)  . milrinone 0.125 mcg/kg/min (01/28/20 0700)  . norepinephrine (LEVOPHED) Adult infusion Stopped (01/27/20 0338)  . piperacillin-tazobactam (ZOSYN)  IV Stopped (01/28/20 0545)  . vancomycin Stopped (01/27/20 2220)  . vasopressin Stopped (01/26/20 0238)    PRN Medications: sodium chloride, acetaminophen (TYLENOL) oral liquid 160 mg/5 mL, albumin human, albuterol, dextrose, fentaNYL, fentaNYL, guaiFENesin-dextromethorphan, hydrALAZINE, hydrOXYzine, ondansetron **OR** ondansetron (ZOFRAN) IV, sodium chloride flush   Assessment/Plan:   1. Acute hypoxic/hypercapneic respiratory failure in setting of severe COVID PNA/ARDS -> VV ECMO - admit 8/2 - intubation 8/3 - has received actmera (compelted 8/2), remdesivir (completed 8/6) and steroids - failed full vent support with proning/paralytic - Cannulated for VV ECMO on 8/9 - Extubated 8/16 - Relatively stable overnight. Will follow some commands when awake.  - Circuit stable without AC - CXR unchanged  With diffuse infiltrates - Off bival due to [place].   - ECMO circuit with fibrin and some clots but  functioning ok off anticoagulation.though platelets and fibrinogen remain low. LDH stable. Oxygen exchange across oxygenator adequate. May need to start low-dose heparin soon. Would likely  not tolerate circuit change due to instability - Renal functioning continues to worsen today. Will likely need CVVHD soon. - Continue NE for BP support as needed  2. Enterococcus sepsis - continue vanc + high dose zosyn per pharmacy  - No change  3. Intracranial hemorrhage - ? Septic emboli - repeat head CT on 8/14 with stable bleeds but increased edema - neurology following. Suspect significant long-term injury sustained. Will follow commands. Appears to have dense LUE weakness and possibly LLE - repeat head CT stable 8/16 - No AC with ICH for now - Consider TEE at some point to further evalaute. Too unstable now  4. Thrombocytopenia - PLTs < 40K - initially thought due to sepsis but not improving. ? Circuit related - no obvious bleeding - would not transfuse PLTs unless absolutely necessary given risk of clotting pump  4. Morbid obesity - Body mass index is 49.38 kg/m.  5. Poorly controlled DM2 - HgBA1c 10.7 - CBGs remain elevated - On IV insulin adjust as needed  6. Hypernatremia - Na 148. On FW  7. Lactic acidosis -  Up slightly today. ? Due to renal failure  8. PAF - 2 episodes of AF with RVR overnight - back in NSR on IV amio this am. Continue   9. AKI/azotemia - hold diuretics today - worsening. Will d/w Renal re: CVVHD    CRITICAL CARE Performed by: Glori Bickers  Total critical care time: 35 minutes  Critical care time was exclusive of separately billable procedures and treating other patients.  Critical care was necessary to treat or prevent imminent or life-threatening deterioration.  Critical care was time spent personally by me (independent of midlevel providers or residents) on the following activities: development of treatment plan with patient and/or  surrogate as well as nursing, discussions with consultants, evaluation of patient's response to treatment, examination of patient, obtaining history from patient or surrogate, ordering and performing treatments and interventions, ordering and review of laboratory studies, ordering and review of radiographic studies, pulse oximetry and re-evaluation of patient's condition.   Length of Stay: 20   Glori Bickers  MD 01/28/2020, 7:53 AM  Advanced Heart Failure Team Pager [phone] (M-F; 7a - 4p)  Please contact [facility] for night-coverage after hours (4p -7a ) and weekends on [email or web address]

## 2020-01-28 NOTE — Progress Notes (Signed)
Subjective: Not responsive other than raising head a bit when Dr. Jeffie Pollock asked her to stick out her tongue   Antibiotics:  Anti-infectives (From admission, onward)   Start     Dose/Rate Route Frequency Ordered Stop   01/28/20 1200  ampicillin (OMNIPEN) 2 g in sodium chloride 0.9 % 100 mL IVPB       Note to Pharmacy: Please dose for renal fxn   2 g 300 mL/hr over 20 Minutes Intravenous Every 6 hours 01/28/20 1052     01/28/20 1100  cefTRIAXone (ROCEPHIN) 2 g in sodium chloride 0.9 % 100 mL IVPB        2 g 200 mL/hr over 30 Minutes Intravenous Every 12 hours 01/28/20 1053     01/26/20 1500  anidulafungin (ERAXIS) 100 mg in sodium chloride 0.9 % 100 mL IVPB  Status:  Discontinued        100 mg 78 mL/hr over 100 Minutes Intravenous Every 24 hours 01/25/20 1413 01/25/20 1420   01/26/20 1500  fluconazole (DIFLUCAN) IVPB 400 mg  Status:  Discontinued        400 mg 100 mL/hr over 120 Minutes Intravenous Every 24 hours 01/25/20 1420 01/28/20 0801   01/25/20 2200  vancomycin (VANCOCIN) IVPB 1000 mg/200 mL premix  Status:  Discontinued        1,000 mg 200 mL/hr over 60 Minutes Intravenous Every 12 hours 01/25/20 1413 01/28/20 0801   01/25/20 1500  anidulafungin (ERAXIS) 200 mg in sodium chloride 0.9 % 200 mL IVPB  Status:  Discontinued        200 mg 78 mL/hr over 200 Minutes Intravenous  Once 01/25/20 1413 01/25/20 1420   01/25/20 1500  fluconazole (DIFLUCAN) IVPB 800 mg        800 mg 100 mL/hr over 240 Minutes Intravenous  Once 01/25/20 1420 01/25/20 1944   01/25/20 1000  vancomycin (VANCOCIN) 2,500 mg in sodium chloride 0.9 % 500 mL IVPB        2,500 mg 250 mL/hr over 120 Minutes Intravenous  Once 01/25/20 0956 01/25/20 1321   01/23/20 2100  piperacillin-tazobactam (ZOSYN) IVPB 4.5 g  Status:  Discontinued        4.5 g 200 mL/hr over 30 Minutes Intravenous Every 6 hours 01/23/20 1529 01/28/20 1052   01/21/20 0800  vancomycin (VANCOCIN) IVPB 1000 mg/200 mL premix  Status:   Discontinued        1,000 mg 200 mL/hr over 60 Minutes Intravenous Every 12 hours 01/20/20 1848 01/21/20 1205   01/21/20 0700  vancomycin (VANCOCIN) IVPB 1000 mg/200 mL premix  Status:  Discontinued        1,000 mg 200 mL/hr over 60 Minutes Intravenous Every 12 hours 01/20/20 1813 01/20/20 1848   01/19/20 1400  vancomycin (VANCOREADY) IVPB 1250 mg/250 mL  Status:  Discontinued        1,250 mg 166.7 mL/hr over 90 Minutes Intravenous Every 12 hours 01/19/20 1114 01/20/20 1813   01/19/20 1200  piperacillin-tazobactam (ZOSYN) IVPB 4.5 g  Status:  Discontinued        4.5 g 200 mL/hr over 30 Minutes Intravenous Every 6 hours 01/19/20 1038 01/23/20 1529   01/19/20 0200  vancomycin (VANCOREADY) IVPB 1250 mg/250 mL  Status:  Discontinued        1,250 mg 166.7 mL/hr over 90 Minutes Intravenous Every 12 hours 01/18/20 1333 01/19/20 1038   01/18/20 1400  vancomycin (VANCOCIN) 2,500 mg in sodium chloride 0.9 % 500 mL IVPB  2,500 mg 250 mL/hr over 120 Minutes Intravenous  Once 01/18/20 1333 01/18/20 1609   01/18/20 1400  meropenem (MERREM) 2 g in sodium chloride 0.9 % 100 mL IVPB  Status:  Discontinued        2 g 200 mL/hr over 30 Minutes Intravenous Every 8 hours 01/18/20 1333 01/19/20 1038   01/09/20 1200  cefTRIAXone (ROCEPHIN) 2 g in sodium chloride 0.9 % 100 mL IVPB  Status:  Discontinued        2 g 200 mL/hr over 30 Minutes Intravenous Every 24 hours 01/09/20 1135 01/12/20 0915   01/09/20 1200  azithromycin (ZITHROMAX) 500 mg in sodium chloride 0.9 % 250 mL IVPB  Status:  Discontinued        500 mg 250 mL/hr over 60 Minutes Intravenous Every 24 hours 01/09/20 1135 01/12/20 0915   01/09/20 1000  remdesivir 100 mg in sodium chloride 0.9 % 100 mL IVPB        100 mg 200 mL/hr over 30 Minutes Intravenous Daily 02/05/2020 1057 01/12/20 0840   01/29/2020 1100  remdesivir 100 mg in sodium chloride 0.9 % 100 mL IVPB        100 mg 200 mL/hr over 30 Minutes Intravenous Every 30 min 01/22/2020 1057  01/16/2020 1342      Medications: Scheduled Meds: . sodium chloride   Intravenous Once  . vitamin C  500 mg Per Tube Daily  . chlorhexidine gluconate (MEDLINE KIT)  15 mL Mouth Rinse BID  . Chlorhexidine Gluconate Cloth  6 each Topical Daily  . clonazePAM  1 mg Per Tube Q6H  . docusate  100 mg Per Tube BID  . etomidate  40 mg Intravenous Once  . feeding supplement (PROSource TF)  45 mL Per Tube BID  . fentaNYL (SUBLIMAZE) injection  200 mcg Intravenous Once  . fentaNYL (SUBLIMAZE) injection  50 mcg Intravenous Once  . free water  400 mL Per Tube Q4H  . linagliptin  5 mg Per Tube Daily  . mouth rinse  15 mL Mouth Rinse 10 times per day  . midazolam  5 mg Intravenous Once  . nutrition supplement (JUVEN)  1 packet Per Tube BID BM  . oxyCODONE  10 mg Per Tube Q6H  . pantoprazole sodium  40 mg Per Tube Daily  . polyethylene glycol  17 g Per Tube Daily  . QUEtiapine  100 mg Per Tube BID  . sennosides  5 mL Per Tube BID  . sodium chloride flush  10-40 mL Intracatheter Q12H  . vecuronium  10 mg Intravenous Once  . zinc sulfate  220 mg Per Tube Daily   Continuous Infusions: . sodium chloride 250 mL (01/21/20 1227)  . albumin human 12.5 g (01/24/20 2327)  . amiodarone 30 mg/hr (01/28/20 0900)  . ampicillin (OMNIPEN) IV    . cefTRIAXone (ROCEPHIN)  IV    . dexmedetomidine (PRECEDEX) IV infusion Stopped (01/27/20 0647)  . feeding supplement (PIVOT 1.5 CAL) 75 mL/hr at 01/28/20 0800  . fentaNYL infusion INTRAVENOUS Stopped (01/27/20 0057)  . insulin 15 mL/hr at 01/28/20 0900  . labetalol (NORMODYNE) infusion 5 mg/mL Stopped (01/24/20 0505)  . milrinone 0.125 mcg/kg/min (01/28/20 0900)  . norepinephrine (LEVOPHED) Adult infusion Stopped (01/28/20 0157)  . vasopressin Stopped (01/26/20 0238)   PRN Meds:.sodium chloride, acetaminophen (TYLENOL) oral liquid 160 mg/5 mL, albumin human, albuterol, dextrose, fentaNYL, fentaNYL, guaiFENesin-dextromethorphan, hydrALAZINE, hydrOXYzine,  ondansetron **OR** ondansetron (ZOFRAN) IV, sodium chloride flush    Objective: Weight change: 0.9 kg  Intake/Output  Summary (Last 24 hours) at 01/28/2020 1105 Last data filed at 01/28/2020 0900 Gross per 24 hour  Intake 3938.56 ml  Output 2060 ml  Net 1878.56 ml   Blood pressure (!) 119/48, pulse 99, temperature (!) 96.4 F (35.8 C), resp. rate (!) 8, height 5' 4" (1.626 m), weight (!) 152 kg, SpO2 (!) 85 %. Temp:  [94.8 F (34.9 C)-96.4 F (35.8 C)] 96.4 F (35.8 C) (08/22 0915) Pulse Rate:  [87-132] 99 (08/22 0915) Resp:  [0-43] 8 (08/22 0915) BP: (119-125)/(48-56) 119/48 (08/22 0818) SpO2:  [83 %-90 %] 85 % (08/22 0915) Arterial Line BP: (80-138)/(47-67) 100/51 (08/22 0915) FiO2 (%):  [100 %] 100 % (08/22 0818) Weight:  [152 kg] 152 kg (08/22 0500)  Physical E Genera: arousable and able to raise head to voice HEENT: anicteric sclera, EOMI CVS tachycardic, no mgr Chest: , no wheezing, rhonchi Abdomen: soft non-distended,  Extremities: she has area on right finger time could be emboli (septic or not septic) Neuro: nonfocal  CBC:    BMET Recent Labs    01/27/20 1505 01/27/20 1816 01/28/20 0308 01/28/20 0308 01/28/20 0528 01/28/20 0821  NA 151*   < > 148*   < > 150* 151*  K 5.3*   < > 4.8   < > 4.5 4.8  CL 110  --  110  --   --   --   CO2 25  --  26  --   --   --   GLUCOSE 457*  --  308*  --   --   --   BUN 183*  --  188*  --   --   --   CREATININE 1.77*  --  1.87*  --   --   --   CALCIUM 9.0  --  9.3  --   --   --    < > = values in this interval not displayed.     Liver Panel  Recent Labs    01/27/20 0410 01/28/20 0308  PROT 5.0* 5.3*  ALBUMIN 2.8* 2.8*  AST 40 35  ALT 61* 61*  ALKPHOS 135* 140*  BILITOT 2.0* 1.4*  BILIDIR 0.5* 0.3*  IBILI 1.5* 1.1*       Sedimentation Rate No results for input(s): ESRSEDRATE in the last 72 hours. C-Reactive Protein No results for input(s): CRP in the last 72 hours.  Micro Results: Recent Results  (from the past 720 hour(s))  Blood Culture (routine x 2)     Status: None   Collection Time: 01/23/2020 10:20 AM   Specimen: BLOOD  Result Value Ref Range Status   Specimen Description   Final    BLOOD RIGHT ANTECUBITAL Performed at [facility], [address] [place], [place] [address]    Special Requests   Final    BOTTLES DRAWN AEROBIC AND ANAEROBIC Blood Culture adequate volume Performed at [facility], [address] [place], [place] [address]    Culture   Final    NO GROWTH 5 DAYS Performed at [facility], [place] [address], [place], [place] [address]    Report Status 01/13/2020 FINAL  Final  Blood Culture (routine x 2)     Status: None   Collection Time: 01/26/2020 11:19 AM   Specimen: BLOOD  Result Value Ref Range Status   Specimen Description   Final    BLOOD BLOOD RIGHT FOREARM Performed at [facility], [address] [place],  [place] [address]    Special Requests   Final    BOTTLES DRAWN AEROBIC ONLY Blood Culture results may not be optimal due to an inadequate volume of blood received in culture bottles Performed at [facility], [address] [place], [place] [address]    Culture   Final    NO GROWTH 5 DAYS Performed at [facility], [place] [address], [place], [place] [address]    Report Status 01/13/2020 FINAL  Final  SARS Coronavirus 2 by RT PCR (hospital order, performed in [facility] hospital lab) Nasopharyngeal Nasopharyngeal Swab     Status: Abnormal   Collection Time: 01/23/2020 11:19 AM   Specimen: Nasopharyngeal Swab  Result Value Ref Range Status   SARS Coronavirus 2 POSITIVE (A) NEGATIVE Final    Comment: RESULT CALLED TO, READ BACK BY AND VERIFIED WITH:  SIMMS MARVA, RN @ 1749 ON 01/19/2020, CABELLERO.P (NOTE) SARS-CoV-2 target nucleic acids are DETECTED  SARS-CoV-2 RNA is generally detectable in upper respiratory specimens  during the acute phase of infection.  Positive results are  indicative  of the presence of the identified virus, but do not rule out bacterial infection or co-infection with other pathogens not detected by the test.  Clinical correlation with patient history and  other diagnostic information is necessary to determine patient infection status.  The expected result is negative.  Fact Sheet for Patients:   StrictlyIdeas.no   Fact Sheet for Healthcare Providers:   BankingDealers.co.za    This test is not yet approved or cleared by the [place] FDA and  has been authorized for detection and/or diagnosis of SARS-CoV-2 by FDA under an Emergency Use Authorization (EUA).  This EUA will remain in effect  (meaning this test can be used) for the duration of  the COVID-19 declaration under Section 564(b)(1) of the Act, 21 U.S.C. section 360-bbb-3(b)(1), unless the authorization is terminated or revoked sooner.  Performed at [facility], [address] [place], [place] [address]   Culture, respiratory (non-expectorated)     Status: None   Collection Time: 01/10/20  8:32 AM   Specimen: Tracheal Aspirate; Respiratory  Result Value Ref Range Status   Specimen Description   Final    TRACHEAL ASPIRATE Performed at [place] [address] [place], [place] [address]    Special Requests   Final    NONE Performed at [facility], [place] [address], [place], [place] [address]    Gram Stain NO WBC SEEN NO ORGANISMS SEEN   Final   Culture   Final    FEW Consistent with normal respiratory flora. No Pseudomonas species isolated Performed at [place] [address], [place], [place] [address]    Report Status 01/12/2020 FINAL  Final  MRSA PCR Screening     Status: None   Collection Time: 01/10/20  6:11 PM   Specimen: Nasal Mucosa; Nasopharyngeal  Result Value Ref Range Status   MRSA by PCR NEGATIVE NEGATIVE Final    Comment:        The  GeneXpert MRSA Assay (FDA approved for NASAL specimens only), is one component of a comprehensive MRSA colonization surveillance program. It is not intended to diagnose MRSA infection nor to guide or monitor treatment for MRSA infections. Performed at [facility], [place] [address], [place], [place] [address]   Culture, Urine     Status: None   Collection Time: 01/10/20  6:11 PM  Specimen: Urine, Random  Result Value Ref Range Status   Specimen Description   Final    URINE, RANDOM Performed at [place] [address] [place], [place] [address]    Special Requests   Final    NONE Performed at [facility], [place] [address], [place], [place] [address]    Culture   Final    NO GROWTH Performed at [facility], [place] [address] [place], [place] [address]    Report Status 01/11/2020 FINAL  Final  Culture, respiratory (non-expectorated)     Status: None   Collection Time: 01/18/20  8:44 AM   Specimen: Tracheal Aspirate; Respiratory  Result Value Ref Range Status   Specimen Description TRACHEAL ASPIRATE  Final   Special Requests NONE  Final   Gram Stain   Final    NO WBC SEEN FEW GRAM POSITIVE COCCI IN PAIRS IN CLUSTERS Performed at [facility], [address], [place], [place] [address]    Culture ABUNDANT STAPHYLOCOCCUS AUREUS  Final   Report Status 01/20/2020 FINAL  Final   Organism ID, Bacteria STAPHYLOCOCCUS AUREUS  Final      Susceptibility   Staphylococcus aureus - MIC*    CIPROFLOXACIN <=0.5 SENSITIVE Sensitive     ERYTHROMYCIN >=8 RESISTANT Resistant     GENTAMICIN <=0.5 SENSITIVE Sensitive     OXACILLIN <=0.25 SENSITIVE Sensitive     TETRACYCLINE <=1 SENSITIVE Sensitive     VANCOMYCIN <=0.5 SENSITIVE Sensitive     TRIMETH/SULFA <=10 SENSITIVE Sensitive     CLINDAMYCIN RESISTANT Resistant     RIFAMPIN <=0.5 SENSITIVE Sensitive     Inducible Clindamycin POSITIVE Resistant     * ABUNDANT  STAPHYLOCOCCUS AUREUS  Culture, blood (Routine X 2) w Reflex to ID Panel     Status: Abnormal   Collection Time: 01/18/20 10:17 AM   Specimen: BLOOD  Result Value Ref Range Status   Specimen Description BLOOD LEFT ANTECUBITAL  Final   Special Requests   Final    BOTTLES DRAWN AEROBIC ONLY Blood Culture adequate volume   Culture  Setup Time   Final    AEROBIC BOTTLE ONLY GRAM POSITIVE COCCI Organism ID to follow CRITICAL RESULT CALLED TO, READ BACK BY AND VERIFIED WITH: [place] Brick [facility] 01/19/20 0044 JDW Performed at [facility], [address], [place], [place] [address]    Culture ENTEROCOCCUS FAECALIS (A)  Final   Report Status 01/20/2020 FINAL  Final   Organism ID, Bacteria ENTEROCOCCUS FAECALIS  Final      Susceptibility   Enterococcus faecalis - MIC*    AMPICILLIN <=2 SENSITIVE Sensitive     VANCOMYCIN 1 SENSITIVE Sensitive     GENTAMICIN SYNERGY SENSITIVE Sensitive     * ENTEROCOCCUS FAECALIS  Blood Culture ID Panel (Reflexed)     Status: Abnormal   Collection Time: 01/18/20 10:17 AM  Result Value Ref Range Status   Enterococcus faecalis DETECTED (A) NOT DETECTED Final    Comment: CRITICAL RESULT CALLED TO, READ BACK BY AND VERIFIED WITH: G ABBOTT PHARMD 01/19/20 0044 JDW    Enterococcus Faecium NOT DETECTED NOT DETECTED Final   Listeria monocytogenes NOT DETECTED NOT DETECTED Final   Staphylococcus species NOT DETECTED NOT DETECTED Final   Staphylococcus aureus (BCID) NOT DETECTED NOT DETECTED Final   Staphylococcus epidermidis NOT DETECTED NOT DETECTED Final   Staphylococcus lugdunensis NOT DETECTED NOT DETECTED Final   Streptococcus species NOT DETECTED NOT DETECTED Final   Streptococcus agalactiae  NOT DETECTED NOT DETECTED Final   Streptococcus pneumoniae NOT DETECTED NOT DETECTED Final   Streptococcus pyogenes NOT DETECTED NOT DETECTED Final   A.calcoaceticus-baumannii NOT DETECTED NOT DETECTED Final   Bacteroides fragilis NOT DETECTED NOT DETECTED Final    Enterobacterales NOT DETECTED NOT DETECTED Final   Enterobacter cloacae complex NOT DETECTED NOT DETECTED Final   Escherichia coli NOT DETECTED NOT DETECTED Final   Klebsiella aerogenes NOT DETECTED NOT DETECTED Final   Klebsiella oxytoca NOT DETECTED NOT DETECTED Final   Klebsiella pneumoniae NOT DETECTED NOT DETECTED Final   Proteus species NOT DETECTED NOT DETECTED Final   Salmonella species NOT DETECTED NOT DETECTED Final   Serratia marcescens NOT DETECTED NOT DETECTED Final   Haemophilus influenzae NOT DETECTED NOT DETECTED Final   Neisseria meningitidis NOT DETECTED NOT DETECTED Final   Pseudomonas aeruginosa NOT DETECTED NOT DETECTED Final   Stenotrophomonas maltophilia NOT DETECTED NOT DETECTED Final   Candida albicans NOT DETECTED NOT DETECTED Final   Candida auris NOT DETECTED NOT DETECTED Final   Candida glabrata NOT DETECTED NOT DETECTED Final   Candida krusei NOT DETECTED NOT DETECTED Final   Candida parapsilosis NOT DETECTED NOT DETECTED Final   Candida tropicalis NOT DETECTED NOT DETECTED Final   Cryptococcus neoformans/gattii NOT DETECTED NOT DETECTED Final   Vancomycin resistance NOT DETECTED NOT DETECTED Final    Comment: Performed at [facility], [address] [place], [place] [address]  Culture, blood (Routine X 2) w Reflex to ID Panel     Status: Abnormal   Collection Time: 01/18/20 10:30 AM   Specimen: BLOOD LEFT ARM  Result Value Ref Range Status   Specimen Description BLOOD LEFT ARM  Final   Special Requests   Final    BOTTLES DRAWN AEROBIC ONLY Blood Culture adequate volume   Culture  Setup Time   Final    AEROBIC BOTTLE ONLY GRAM POSITIVE COCCI CRITICAL VALUE NOTED.  VALUE IS CONSISTENT WITH PREVIOUSLY REPORTED AND CALLED VALUE.    Culture (A)  Final    ENTEROCOCCUS FAECALIS SUSCEPTIBILITIES PERFORMED ON PREVIOUS CULTURE WITHIN THE LAST 5 DAYS. Performed at [facility], [place] [address], [place], [place] [address]    Report Status  01/20/2020 FINAL  Final  Culture, blood (routine x 2)     Status: None   Collection Time: 01/21/20  6:07 PM   Specimen: BLOOD LEFT HAND  Result Value Ref Range Status   Specimen Description BLOOD LEFT HAND  Final   Special Requests   Final    BOTTLES DRAWN AEROBIC AND ANAEROBIC Blood Culture adequate volume   Culture   Final    NO GROWTH 5 DAYS Performed at [facility], [place] [address] [place], [place] [address]    Report Status 01/26/2020 FINAL  Final  Culture, blood (routine x 2)     Status: None   Collection Time: 01/21/20  6:14 PM   Specimen: BLOOD LEFT FOREARM  Result Value Ref Range Status   Specimen Description BLOOD LEFT FOREARM  Final   Special Requests   Final    BOTTLES DRAWN AEROBIC ONLY Blood Culture adequate volume   Culture   Final    NO GROWTH 5 DAYS Performed at [facility], [place] [address] [place], [place] [address]    Report Status 01/26/2020 FINAL  Final  Culture, blood (Routine X 2) w Reflex to ID Panel     Status: None (Preliminary result)   Collection Time: 01/25/20  1:15 PM   Specimen: BLOOD  Result Value Ref Range Status   Specimen Description BLOOD LEFT ANTECUBITAL  Final   Special Requests   Final    BOTTLES DRAWN AEROBIC AND ANAEROBIC Blood Culture adequate volume   Culture   Final    NO GROWTH 3 DAYS Performed at [facility], [address] [place], [place] [address]    Report Status PENDING  Incomplete  Culture, blood (Routine X 2) w Reflex to ID Panel     Status: None (Preliminary result)   Collection Time: 01/25/20  1:23 PM   Specimen: BLOOD LEFT FOREARM  Result Value Ref Range Status   Specimen Description BLOOD LEFT FOREARM  Final   Special Requests   Final    BOTTLES DRAWN AEROBIC AND ANAEROBIC Blood Culture adequate volume   Culture   Final    NO GROWTH 3 DAYS Performed at [facility], [place] [address] [place], [place] [address]    Report Status PENDING  Incomplete    Studies/Results: DG CHEST PORT 1  VIEW  Result Date: 01/28/2020 CLINICAL DATA:  Severe acute respiratory failure.  COVID-19. EXAM: PORTABLE CHEST 1 VIEW COMPARISON:  None. FINDINGS: An ECMO cannula is again identified. The feeding tube terminates below today's film. The left IJ is stable terminating in the SVC. Complete opacification of the lungs bilaterally obscuring the heart mediastinal contours remains. No significant change. No pneumothorax. A right PICC line probably terminates in the right subclavian vein, unchanged. Orthopedic fixation hardware remains in the lower cervical spine, only partly imaged today. IMPRESSION: 1. Support apparatus as above. 2. Complete opacification of the lungs bilaterally, unchanged in the interval. Electronically Signed   By: Dorise Bullion III M.D   On: 01/28/2020 08:25   DG CHEST PORT 1 VIEW  Result Date: 01/27/2020 CLINICAL DATA:  54 year old female with history of cardiorespiratory failure. EXAM: PORTABLE CHEST 1 VIEW COMPARISON:  Chest x-ray 01/26/2020. FINDINGS: ECMO cannula noted. Left internal jugular central venous catheter with tip terminating in the proximal superior vena cava. A feeding tube is seen extending into the abdomen, however, the tip of the feeding tube extends below the lower margin of the image. Complete opacification of the lungs bilaterally obscuring the heart and mediastinal contours. No pneumothorax. Orthopedic fixation hardware throughout the lower cervical spine incidentally noted. IMPRESSION: 1. Support apparatus, as above. 2. Complete opacification of the lungs bilaterally, similar to the prior study. Electronically Signed   By: Vinnie Langton M.D.   On: 01/27/2020 08:33      Assessment/Plan:  INTERVAL HISTORY: Af w RVR overnight   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, managed as type 1 ([place])   Pneumonia due to  COVID-19 virus   Intracerebral hemorrhage   Personal history of ECMO   Palliative care by specialist   Goals of care, counseling/discussion    Harbor L Titzer is a 54 y.o. female with  Severe COVID 19 pneumonia on ECMO w AMP S enterococcal bacteremia, MSSA PNA  #1 AMP S to coccal bacteremia there is concern for possible septic embolization due to endocarditis.  I will therefore put her on dual beta-lactam therapy with ampicillin plus high-dose ceftriaxone  #2 MSSA pneumonia will be covered by the ceftriaxone  #3 leukocytosis: With not also isolated another target that deserves vancomycin or antifungal therapy so we will dispense with  these for now.  Case reviewed with Dr. Haroldine Laws  Dr. Baxter Flattery will back tomorrow   LOS: 20 days   Alcide Evener 01/28/2020, 11:05 AM

## 2020-01-28 NOTE — Progress Notes (Signed)
Pharmacy Antibiotic Note  Alisha Stephenson is a 54 y.o. female admitted on 01/10/2020 with COVID pneumonia now s/p VV ECMO cannulation on 01/19/2020.  Found to have staph aureus (MSSA) + enterococcus faecalis (pan-sen). Patient has been on high-dose Zosyn given pharmacokinetic properties with ECMO. Pharmacy has been consulted for ampicillin (she is also on rocephin)  Found to have staph aureus (MSSA) + enterococcus faecalis (pan-sen). Patient has been on high-dose Zosyn given pharmacokinetic properties with ECMO. Now with concern of endocarditis.   With rocephin there is some sequestion with ECMO so will need to maintain the maximal dose. Will also need to maximize dosing with ampicillin   Plan: Ampicillin 2gm IV q4h Would maintain a rocephin dose of  2gm IV q12h Will follow patient progress    Height: 5\' 4"  (162.6 cm) Weight: (!) 152 kg (335 lb 1.6 oz) IBW/kg (Calculated) : 54.7  Temp (24hrs), Avg:95.8 F (35.4 C), Min:94.8 F (34.9 C), Max:96.4 F (35.8 C)  Recent Labs  Lab 01/26/20 0401 01/26/20 1710 01/27/20 0410 01/27/20 1504 01/27/20 1505 01/28/20 0308  WBC 12.4* 13.3* 11.8*  --  11.0* 10.8*  CREATININE 1.64* 1.87* 1.81*  --  1.77* 1.87*  LATICACIDVEN 2.8* 1.4 1.9 2.0*  --  2.9*    Estimated Creatinine Clearance: 50.2 mL/min (A) (by C-G formula based on SCr of 1.87 mg/dL (H)).    Allergies  Allergen Reactions  . Sulfa Antibiotics Rash and Other (See Comments)    Antimicrobials this admission: Remdesivir 8/2 >> 8/6 Azithromycin 8/3 >> 8/6 CTX 8/3 >> 8/6 Vancomycin 8/12 >> 8/15, 8/19>> Meropenem 8/12 >> 8/13 Zosyn 8/13 >> Fluconazole 8/19 >>  Dose adjustments this admission: VT 25 - adjust regimen to vanc 1g IV every 12 hours  Microbiology results: 8/19 XJO:ITGP 8/15 BCx: neg 8/12 BCx: enterococcus (pan-sens) 8/12 TA: MSSA 8/4 TA: neg 8/4 UCx: neg 8/2 BCx: neg 8/4 MRSA PCR neg 8/2 COVID pos  Hildred Laser, PharmD Clinical Pharmacist **Pharmacist phone  directory can now be found on [email or web address] ([email or web address]).  Listed under [place].

## 2020-01-28 NOTE — CV Procedure (Signed)
ECMO NOTE:  Indication: Respiratory failure due to COVID PNA  Initial cannulation date: 01/10/2020  ECMO type: VV ECMO (Centrimag with oximizer)  Dual lumen Inflow/return cannula: 32 FR RIJ [place] placed 01/25/2020  ECMO events:  - Initial cannulation 01/22/2020 - Cannula repositioned (pulled back ) 01/17/20 - Off bival 8/13 due to [place]  Daily data:  Flow5.3 RPM 4200 Sweep12  Labs:  ABG good, pAO2 45 Sodium remains on higher side (goal 145-155 given cerebral edema) Sugars remain up BUN/Cr worse LDH stable in 500s Fibrinogen back down below 60 INR 2.5>>1.9>>2.0 PTT 43>>67 HgB 8.5>>8.2>>8.7 Plts 34>>37>>35 WBC 13>>11>>11   Assessment: -Severe COVID ARDS s/p VA ECMO -Enterococcal bacteremia -MSSA pneumonia -Thrombocytopenia- some combination of hemolysis, bone marrow stunning, and sepsis effect - Intracerebral hemorrhage with question of septic emboli - Muscular deconditioning - Acute kidney failure  Plan: Essentially stable today except worsening uremia and renal function -Continue VV ECMO support -Hold on intubation and trach for now -Hold on [facility] for now, good flows, LDH stable, circuit clots stable on exam -CRRT to start today -Insulin gtt -Dual beta lactam therapy per ID, TEE at some point when safer -PT & OT consults. -Limited code, daily discussions with family  Erskine Emery MD PCCM

## 2020-01-28 NOTE — Consult Note (Addendum)
Nephrology Consult   Requesting provider: Erskine Emery Service requesting consult: CCM Reason for consult: Uremia, AKI, hypernatremia, volume overload   Assessment/Recommendations: Alisha Stephenson is a/an 54 y.o. female with a past medical history DM2, obesity, NAFLD who present w/ acute hypoxic respiratory failure due to Covid.  Hospitalization complicated by severe shock, refractory hypoxia requiring ECMO, multiple infections, intracranial hemorrhage, encephalopathy, AKI.  Non-Oliguric AKI: Likely secondary to shock causing ATN.  Complicated by significant uremia with a BUN of 188 as well as electrolyte disturbance as below.  No significant acidosis at this time.  Also with extensive volume overload as below -Start CRRT w/ attempt at aggressive UF and treatment for uremia -UF 200-250/hr as tolerated and decrease if pressor requirement significantly increases -DFR at 2 L/h increase if needed -Continue monitor labs twice daily -Avoiding heparin products given intracranial hemorrhage -Notify pharmacy of CRRT initiation and adjust antibiotics as needed -Monitor Daily I/Os, Daily weight  -Maintain MAP>65 for optimal renal perfusion.  -Foley catheter for accurate ins and outs  Volume Status: Appears grossly volume overloaded on exam.  Based on this finding agree with CRRT for ultrafiltration as above.  Could consider addition of IV Lasix to augment urine output further and help achieve net negative status daily.   Hypernatremia: relative free water deficit. Should improve with CRRT initiation w/ bath Na equivalent to 140. Continue feeds per primary team  Toxic metabolic encephalopathy: Multifactorial with uremia contributing.  Treatment with CRRT as above.  Shock: Multifactorial related to Covid, RV stunning, and ongoing concern for systemic infection.  Antibiotics and supportive medications per primary team  Anemia: With hemolysis likely and acute illness contributing.  Transfusions per primary  team  Intracranial hemorrhage: With some left-sided weakness preceded by primary team.  Avoiding heparin products as able  Diabetes mellitus type 2 uncontrolled with hyperglycemia: Management per primary team   Recommendations conveyed to primary service.    [facility] 01/28/2020 2:07 PM   _____________________________________________________________________________________ CC: AKI, uremia, volume overload  History of Present Illness: Alisha Stephenson is a/an 54 y.o. female with a past medical history of DM2, diverticulitis, NAFLD, obesity who presents with acute hypoxic respiratory failure secondary to Covid infection.  The patient was sedated so history was obtained per chart review  Patient was initially admitted to the hospital on 8/2 for hypoxia and was found to be Covid positive.  Patient was transferred to the ICU on 8/3 and was intubated.  She was prone and paralyzed at that time with significant hypoxia.  Due to persistent desaturation despite aggressive medical therapy she was ultimately put on VV ECMO on 8/9.  She had anticoagulation during that time but suffered a intracranial hemorrhage that was multifocal.  Her hospital course has been very complicated related to ARDS and continued hypoxia, dependence on VV ECMO, MSSA pneumonia, enterococcal bacteremia, coagulopathy (with low fibrinogen and thrombocytopenia), intracranial hemorrhage, encephalopathy, shock, and AKI.  Regards to the patient's AKI it appears that her baseline creatinine is normal at around 0.8.  Shortly after arrival her creatinine increased to 1.6 but returned to normal on 8/7.  Creatinine began to slowly rise again on 8/10 and again rose on 8/19.  For the past 2 days the patient's creatinine has been around 1.8.  The patient has continued to have relatively good urine output (about 2 L/day) but due to the patient's extensive infusion requirements, nutritional support, and other IV  medications she has been progressively volume overloaded (felt 14 L positive  since admission).  The patient's BUN has also risen up to 188 which is likely contributing to her encephalopathy.   Medications:  Current Facility-Administered Medications  Medication Dose Route Frequency Provider Last Rate Last Admin  . 0.9 %  sodium chloride infusion (Manually program via Guardrails IV Fluids)   Intravenous Once Noemi Chapel P, DO      . 0.9 %  sodium chloride infusion   Intravenous PRN Bensimhon, Shaune Pascal, MD 10 mL/hr at 01/21/20 1227 250 mL at 01/21/20 1227  . acetaminophen (TYLENOL) 160 MG/5ML solution 650 mg  650 mg Per Tube Q6H PRN Julian Hy, DO   650 mg at 01/25/20 2007  . albumin human 5 % solution 12.5 g  12.5 g Intravenous Q15 min PRN Bensimhon, Shaune Pascal, MD 60 mL/hr at 01/28/20 1229 12.5 g at 01/28/20 1229  . albumin human 5 % solution 12.5 g  12.5 g Intravenous Once Candee Furbish, MD      . albuterol (PROVENTIL) (2.5 MG/3ML) 0.083% nebulizer solution 2.5 mg  2.5 mg Nebulization Q4H PRN Bensimhon, Shaune Pascal, MD      . amiodarone (NEXTERONE PREMIX) 360-4.14 MG/200ML-% (1.8 mg/mL) IV infusion  30 mg/hr Intravenous Continuous Agarwala, Ravi, MD 16.67 mL/hr at 01/28/20 1300 30 mg/hr at 01/28/20 1300  . ampicillin (OMNIPEN) 2 g in sodium chloride 0.9 % 100 mL IVPB  2 g Intravenous Q4H Kris Mouton, RPH      . ascorbic acid (VITAMIN C) tablet 500 mg  500 mg Per Tube Daily Noemi Chapel P, DO   500 mg at 01/28/20 0834  . cefTRIAXone (ROCEPHIN) 2 g in sodium chloride 0.9 % 100 mL IVPB  2 g Intravenous Q12H Tommy Medal, Lavell Islam, MD   Stopped at 01/28/20 1211  . chlorhexidine gluconate (MEDLINE KIT) (PERIDEX) 0.12 % solution 15 mL  15 mL Mouth Rinse BID Bensimhon, Shaune Pascal, MD   15 mL at 01/28/20 0835  . Chlorhexidine Gluconate Cloth 2 % PADS 6 each  6 each Topical Daily Bensimhon, Shaune Pascal, MD   6 each at 01/27/20 1100  . clonazePAM (KLONOPIN) disintegrating tablet 1 mg  1 mg Per Tube Q6H  Kipp Brood, MD   1 mg at 01/28/20 1111  . dexmedetomidine (PRECEDEX) 400 MCG/100ML (4 mcg/mL) infusion  0.4-1.2 mcg/kg/hr Intravenous Titrated Rigoberto Noel, MD   Stopped at 01/27/20 [phone]  . dextrose 50 % solution 0-50 mL  0-50 mL Intravenous PRN Noemi Chapel P, DO      . docusate (COLACE) 50 MG/5ML liquid 100 mg  100 mg Per Tube BID Rigoberto Noel, MD   100 mg at 01/28/20 0834  . etomidate (AMIDATE) injection 40 mg  40 mg Intravenous Once Candee Furbish, MD      . feeding supplement (PIVOT 1.5 CAL) liquid 1,000 mL  1,000 mL Per Tube Continuous Audria Nine, DO 75 mL/hr at 01/28/20 1138 1,000 mL at 01/28/20 1138  . feeding supplement (PROSource TF) liquid 45 mL  45 mL Per Tube BID Audria Nine, DO   45 mL at 01/28/20 0834  . fentaNYL (SUBLIMAZE) 5000 mcg / 100 mL (50 mcg/mL) infusion  0-400 mcg/hr Intravenous Continuous Bensimhon, Shaune Pascal, MD   Stopped at 01/27/20 0057  . fentaNYL (SUBLIMAZE) bolus via infusion 50 mcg  50 mcg Intravenous Q15 min PRN Bensimhon, Shaune Pascal, MD   50 mcg at 01/14/20 1455  . fentaNYL (SUBLIMAZE) bolus via infusion 50 mcg  50 mcg Intravenous Q15 min PRN Bensimhon,  Shaune Pascal, MD   50 mcg at 01/26/20 0515  . fentaNYL (SUBLIMAZE) injection 200 mcg  200 mcg Intravenous Once Candee Furbish, MD      . fentaNYL (SUBLIMAZE) injection 50 mcg  50 mcg Intravenous Once Bensimhon, Shaune Pascal, MD      . free water 400 mL  400 mL Per Tube Q4H Candee Furbish, MD   400 mL at 01/28/20 1112  . guaiFENesin-dextromethorphan (ROBITUSSIN DM) 100-10 MG/5ML syrup 10 mL  10 mL Per Tube Q4H PRN Noemi Chapel P, DO      . hydrALAZINE (APRESOLINE) injection 10 mg  10 mg Intravenous Q1H PRN Bensimhon, Shaune Pascal, MD   10 mg at 01/22/20 1536  . hydrOXYzine (ATARAX/VISTARIL) tablet 25 mg  25 mg Per Tube TID PRN Noemi Chapel P, DO   25 mg at 01/24/20 0509  . insulin regular, human (MYXREDLIN) 100 units/ 100 mL infusion   Intravenous Continuous Julian Hy, DO 14 mL/hr at 01/28/20 1300 Rate  Verify at 01/28/20 1300  . labetalol (NORMODYNE) infusion 5 mg/mL  0.5-3 mg/min Intravenous Titrated Candee Furbish, MD   Paused at 01/24/20 0505  . linagliptin (TRADJENTA) tablet 5 mg  5 mg Per Tube Daily Noemi Chapel P, DO   5 mg at 01/28/20 0834  . MEDLINE mouth rinse  15 mL Mouth Rinse 10 times per day Bensimhon, Shaune Pascal, MD   15 mL at 01/28/20 1321  . midazolam (VERSED) injection 5 mg  5 mg Intravenous Once Candee Furbish, MD      . milrinone (PRIMACOR) 20 MG/100 ML (0.2 mg/mL) infusion  0.125 mcg/kg/min Intravenous Continuous Agarwala, Einar Grad, MD 5.51 mL/hr at 01/28/20 1300 0.125 mcg/kg/min at 01/28/20 1300  . norepinephrine (LEVOPHED) 16 mg in 244m premix infusion  0-40 mcg/min Intravenous Titrated Bensimhon, DShaune Pascal MD 5.63 mL/hr at 01/28/20 1300 6 mcg/min at 01/28/20 1300  . nutrition supplement (JUVEN) (JUVEN) powder packet 1 packet  1 packet Per Tube BID BM MAudria Nine DO   1 packet at 01/28/20 [phone] . ondansetron (ZOFRAN) tablet 4 mg  4 mg Oral Q6H PRN Bensimhon, DShaune Pascal MD       Or  . ondansetron ([facility] injection 4 mg  4 mg Intravenous Q6H PRN Bensimhon, DShaune Pascal MD   4 mg at 01/09/20 0224  . oxyCODONE (ROXICODONE) 5 MG/5ML solution 10 mg  10 mg Per Tube Q6H SCandee Furbish MD   10 mg at 01/28/20 0834  . pantoprazole sodium (PROTONIX) 40 mg/20 mL oral suspension 40 mg  40 mg Per Tube Daily CNoemi ChapelP, DO   40 mg at 01/28/20 0834  . polyethylene glycol (MIRALAX / GLYCOLAX) packet 17 g  17 g Per Tube Daily Bensimhon, DShaune Pascal MD   17 g at 01/28/20 0834  . QUEtiapine (SEROQUEL) tablet 100 mg  100 mg Per Tube BID AKipp Brood MD   100 mg at 01/28/20 0834  . sennosides (SENOKOT) 8.8 MG/5ML syrup 5 mL  5 mL Per Tube BID ARigoberto Noel MD   5 mL at 01/27/20 2124  . sodium chloride flush (NS) 0.9 % injection 10-40 mL  10-40 mL Intracatheter Q12H Bensimhon, DShaune Pascal MD   10 mL at 01/28/20 0836  . sodium chloride flush (NS) 0.9 % injection 10-40 mL  10-40 mL  Intracatheter PRN Bensimhon, DShaune Pascal MD      . vasopressin (PITRESSIN) 20 Units in sodium chloride 0.9 % 100 mL infusion-*FOR SHOCK*  0-0.04  Units/min Intravenous Continuous Kipp Brood, MD 12 mL/hr at 01/28/20 1300 0.04 Units/min at 01/28/20 1300  . vecuronium (NORCURON) injection 10 mg  10 mg Intravenous Once Candee Furbish, MD      . zinc sulfate capsule 220 mg  220 mg Per Tube Daily Noemi Chapel P, DO   220 mg at 01/28/20 1610     ALLERGIES Sulfa antibiotics  MEDICAL HISTORY Past Medical History:  Diagnosis Date  . Asthma   . Diabetes mellitus without complication ([place])   . Diverticulitis   . Gallstones   . IBS (irritable bowel syndrome)   . NAFLD (nonalcoholic fatty liver disease)    CT scan 2015  . Ovarian cyst      SOCIAL HISTORY Social History   Socioeconomic History  . Marital status: Married    Spouse name: Not on file  . Number of children: 2  . Years of education: Not on file  . Highest education level: Not on file  Occupational History  . Occupation: Therapist, art   Tobacco Use  . Smoking status: Former Research scientist (life sciences)  . Smokeless tobacco: Never Used  . Tobacco comment: quit over 10 years ago  Vaping Use  . Vaping Use: Never used  Substance and Sexual Activity  . Alcohol use: No    Comment: rarely  . Drug use: No  . Sexual activity: Never  Other Topics Concern  . Not on file  Social History Narrative  . Not on file   Social Determinants of Health   Financial Resource Strain:   . Difficulty of Paying Living Expenses: Not on file  Food Insecurity:   . Worried About Charity fundraiser in the Last Year: Not on file  . Ran Out of Food in the Last Year: Not on file  Transportation Needs:   . Lack of Transportation (Medical): Not on file  . Lack of Transportation (Non-Medical): Not on file  Physical Activity:   . Days of Exercise per Week: Not on file  . Minutes of Exercise per Session: Not on file  Stress:   . Feeling of Stress : Not on file   Social Connections:   . Frequency of Communication with Friends and Family: Not on file  . Frequency of Social Gatherings with Friends and Family: Not on file  . Attends Religious Services: Not on file  . Active Member of Clubs or Organizations: Not on file  . Attends Archivist Meetings: Not on file  . Marital Status: Not on file  Intimate Partner Violence:   . Fear of Current or Ex-Partner: Not on file  . Emotionally Abused: Not on file  . Physically Abused: Not on file  . Sexually Abused: Not on file     FAMILY HISTORY Family History  Problem Relation Age of Onset  . Diabetes Father   . Heart disease Father   . Breast cancer Maternal Grandmother   . Breast cancer Maternal Aunt   . Colon cancer Neg Hx   . Esophageal cancer Neg Hx       Review of Systems: Unable to obtain due to the patient's altered mental status and sedation  Physical Exam: Vitals:   01/28/20 1315 01/28/20 1330  BP:    Pulse: (!) 108 (!) 106  Resp: (!) 22 20  Temp: 98.6 F (37 C) 98.6 F (37 C)  SpO2: (!) 78% (!) 80%   Total I/O In: 1715.3 [I.V.:245.2; NG/GT:970; IV Piggyback:500.1] Out: 500 [Urine:500]  Intake/Output Summary (Last 24 hours)  at 01/28/2020 1407 Last data filed at 01/28/2020 1320 Gross per 24 hour  Intake 4872.4 ml  Output 2010 ml  Net 2862.4 ml   General: Ill-appearing, lying in bed, no distress HEENT: No nasal discharge, moist mucous membranes CV: Normal rate, regular rhythm Lungs: Bilateral chest rise, no increased work of breathing Abd: Obese, nondistended Skin: no visible lesions or rashes Psych: Sedated, appears comfortable Musculoskeletal: no obvious deformities Neuro: Sleeping, not responding to sounds or other stimuli  Test Results Reviewed Lab Results  Component Value Date   NA 150 (H) 01/28/2020   K 5.1 01/28/2020   CL 110 01/28/2020   CO2 26 01/28/2020   BUN 188 (H) 01/28/2020   CREATININE 1.87 (H) 01/28/2020   GFR 81.56 09/04/2019    CALCIUM 9.3 01/28/2020   ALBUMIN 2.8 (L) 01/28/2020   PHOS 4.7 (H) 01/16/2020     I have reviewed all relevant outside healthcare records related to the patient's kidney injury.

## 2020-01-29 ENCOUNTER — Inpatient Hospital Stay (HOSPITAL_COMMUNITY): Payer: PRIVATE HEALTH INSURANCE

## 2020-01-29 DIAGNOSIS — U071 COVID-19: Secondary | ICD-10-CM

## 2020-01-29 DIAGNOSIS — J9601 Acute respiratory failure with hypoxia: Secondary | ICD-10-CM

## 2020-01-29 DIAGNOSIS — G9341 Metabolic encephalopathy: Secondary | ICD-10-CM

## 2020-01-29 DIAGNOSIS — I998 Other disorder of circulatory system: Secondary | ICD-10-CM

## 2020-01-29 DIAGNOSIS — J80 Acute respiratory distress syndrome: Secondary | ICD-10-CM

## 2020-01-29 DIAGNOSIS — I616 Nontraumatic intracerebral hemorrhage, multiple localized: Secondary | ICD-10-CM

## 2020-01-29 LAB — CBC
HCT: 28 % — ABNORMAL LOW (ref 36.0–46.0)
HCT: 28.1 % — ABNORMAL LOW (ref 36.0–46.0)
HCT: 28.6 % — ABNORMAL LOW (ref 36.0–46.0)
Hemoglobin: 8.4 g/dL — ABNORMAL LOW (ref 12.0–15.0)
Hemoglobin: 8.5 g/dL — ABNORMAL LOW (ref 12.0–15.0)
Hemoglobin: 8.6 g/dL — ABNORMAL LOW (ref 12.0–15.0)
MCH: 30.5 pg (ref 26.0–34.0)
MCH: 30.6 pg (ref 26.0–34.0)
MCH: 31 pg (ref 26.0–34.0)
MCHC: 30 g/dL (ref 30.0–36.0)
MCHC: 30.1 g/dL (ref 30.0–36.0)
MCHC: 30.2 g/dL (ref 30.0–36.0)
MCV: 101.8 fL — ABNORMAL HIGH (ref 80.0–100.0)
MCV: 101.8 fL — ABNORMAL HIGH (ref 80.0–100.0)
MCV: 102.6 fL — ABNORMAL HIGH (ref 80.0–100.0)
Platelets: 29 10*3/uL — CL (ref 150–400)
Platelets: 29 10*3/uL — CL (ref 150–400)
Platelets: UNDETERMINED 10*3/uL (ref 150–400)
RBC: 2.74 MIL/uL — ABNORMAL LOW (ref 3.87–5.11)
RBC: 2.75 MIL/uL — ABNORMAL LOW (ref 3.87–5.11)
RBC: 2.81 MIL/uL — ABNORMAL LOW (ref 3.87–5.11)
RDW: 22.3 % — ABNORMAL HIGH (ref 11.5–15.5)
RDW: 22.5 % — ABNORMAL HIGH (ref 11.5–15.5)
RDW: 22.8 % — ABNORMAL HIGH (ref 11.5–15.5)
WBC: 11.9 10*3/uL — ABNORMAL HIGH (ref 4.0–10.5)
WBC: 7.9 10*3/uL (ref 4.0–10.5)
WBC: 9.3 10*3/uL (ref 4.0–10.5)
nRBC: 10.1 % — ABNORMAL HIGH (ref 0.0–0.2)
nRBC: 11.7 % — ABNORMAL HIGH (ref 0.0–0.2)
nRBC: 14.8 % — ABNORMAL HIGH (ref 0.0–0.2)

## 2020-01-29 LAB — BASIC METABOLIC PANEL
Anion gap: 13 (ref 5–15)
Anion gap: 13 (ref 5–15)
BUN: 151 mg/dL — ABNORMAL HIGH (ref 6–20)
BUN: 113 mg/dL — ABNORMAL HIGH (ref 6–20)
CO2: 25 mmol/L (ref 22–32)
CO2: 25 mmol/L (ref 22–32)
Calcium: 8.6 mg/dL — ABNORMAL LOW (ref 8.9–10.3)
Calcium: 8.2 mg/dL — ABNORMAL LOW (ref 8.9–10.3)
Chloride: 109 mmol/L (ref 98–111)
Chloride: 102 mmol/L (ref 98–111)
Creatinine, Ser: 1.45 mg/dL — ABNORMAL HIGH (ref 0.44–1.00)
Creatinine, Ser: 1.26 mg/dL — ABNORMAL HIGH (ref 0.44–1.00)
GFR calc Af Amer: 47 mL/min — ABNORMAL LOW (ref >60)
GFR calc Af Amer: 56 mL/min — ABNORMAL LOW (ref >60)
GFR calc non Af Amer: 40 mL/min — ABNORMAL LOW (ref >60)
GFR calc non Af Amer: 48 mL/min — ABNORMAL LOW (ref >60)
Glucose, Bld: 211 mg/dL — ABNORMAL HIGH (ref 70–99)
Glucose, Bld: 235 mg/dL — ABNORMAL HIGH (ref 70–99)
Potassium: 4.8 mmol/L (ref 3.5–5.1)
Potassium: 4.3 mmol/L (ref 3.5–5.1)
Sodium: 147 mmol/L — ABNORMAL HIGH (ref 135–145)
Sodium: 140 mmol/L (ref 135–145)

## 2020-01-29 LAB — BPAM RBC
Blood Product Expiration Date: 202109122359
Blood Product Expiration Date: 202109122359
Blood Product Expiration Date: 202109122359
Blood Product Expiration Date: 202109212359
ISSUE DATE / TIME: 202108222002
Unit Type and Rh: 5100
Unit Type and Rh: 5100
Unit Type and Rh: 5100
Unit Type and Rh: 5100

## 2020-01-29 LAB — POCT I-STAT 7, (LYTES, BLD GAS, ICA,H+H)
Acid-Base Excess: 1 mmol/L (ref 0.0–2.0)
Acid-Base Excess: 2 mmol/L (ref 0.0–2.0)
Acid-Base Excess: 0 mmol/L (ref 0.0–2.0)
Acid-Base Excess: 1 mmol/L (ref 0.0–2.0)
Bicarbonate: 24.8 mmol/L (ref 20.0–28.0)
Bicarbonate: 27 mmol/L (ref 20.0–28.0)
Bicarbonate: 26 mmol/L (ref 20.0–28.0)
Bicarbonate: 26.4 mmol/L (ref 20.0–28.0)
Calcium, Ion: 1.12 mmol/L — ABNORMAL LOW (ref 1.15–1.40)
Calcium, Ion: 1.11 mmol/L — ABNORMAL LOW (ref 1.15–1.40)
Calcium, Ion: 1.14 mmol/L — ABNORMAL LOW (ref 1.15–1.40)
Calcium, Ion: 1.13 mmol/L — ABNORMAL LOW (ref 1.15–1.40)
HCT: 24 % — ABNORMAL LOW (ref 36.0–46.0)
HCT: 25 % — ABNORMAL LOW (ref 36.0–46.0)
HCT: 26 % — ABNORMAL LOW (ref 36.0–46.0)
HCT: 25 % — ABNORMAL LOW (ref 36.0–46.0)
Hemoglobin: 8.2 g/dL — ABNORMAL LOW (ref 12.0–15.0)
Hemoglobin: 8.5 g/dL — ABNORMAL LOW (ref 12.0–15.0)
Hemoglobin: 8.8 g/dL — ABNORMAL LOW (ref 12.0–15.0)
Hemoglobin: 8.5 g/dL — ABNORMAL LOW (ref 12.0–15.0)
O2 Saturation: 92 %
O2 Saturation: 90 %
O2 Saturation: 87 %
O2 Saturation: 86 %
Patient temperature: 36.2
Patient temperature: 36.3
Patient temperature: 35.7
Patient temperature: 35.9
Potassium: 4.2 mmol/L (ref 3.5–5.1)
Potassium: 4.7 mmol/L (ref 3.5–5.1)
Potassium: 4.1 mmol/L (ref 3.5–5.1)
Potassium: 4.4 mmol/L (ref 3.5–5.1)
Sodium: 146 mmol/L — ABNORMAL HIGH (ref 135–145)
Sodium: 145 mmol/L (ref 135–145)
Sodium: 140 mmol/L (ref 135–145)
Sodium: 139 mmol/L (ref 135–145)
TCO2: 26 mmol/L (ref 22–32)
TCO2: 28 mmol/L (ref 22–32)
TCO2: 27 mmol/L (ref 22–32)
TCO2: 28 mmol/L (ref 22–32)
pCO2 arterial: 34.9 mmHg (ref 32.0–48.0)
pCO2 arterial: 39.1 mmHg (ref 32.0–48.0)
pCO2 arterial: 42.7 mmHg (ref 32.0–48.0)
pCO2 arterial: 44.7 mmHg (ref 32.0–48.0)
pH, Arterial: 7.456 — ABNORMAL HIGH (ref 7.350–7.450)
pH, Arterial: 7.444 (ref 7.350–7.450)
pH, Arterial: 7.387 (ref 7.350–7.450)
pH, Arterial: 7.374 (ref 7.350–7.450)
pO2, Arterial: 59 mmHg — ABNORMAL LOW (ref 83.0–108.0)
pO2, Arterial: 54 mmHg — ABNORMAL LOW (ref 83.0–108.0)
pO2, Arterial: 50 mmHg — ABNORMAL LOW (ref 83.0–108.0)
pO2, Arterial: 50 mmHg — ABNORMAL LOW (ref 83.0–108.0)

## 2020-01-29 LAB — APTT
aPTT: 45 seconds — ABNORMAL HIGH (ref 24–36)
aPTT: 43 seconds — ABNORMAL HIGH (ref 24–36)

## 2020-01-29 LAB — HEPATIC FUNCTION PANEL
ALT: 59 U/L — ABNORMAL HIGH (ref 0–44)
AST: 65 U/L — ABNORMAL HIGH (ref 15–41)
Albumin: 3.2 g/dL — ABNORMAL LOW (ref 3.5–5.0)
Alkaline Phosphatase: 137 U/L — ABNORMAL HIGH (ref 38–126)
Bilirubin, Direct: 0.3 mg/dL — ABNORMAL HIGH (ref 0.0–0.2)
Indirect Bilirubin: 1.2 mg/dL — ABNORMAL HIGH (ref 0.3–0.9)
Total Bilirubin: 1.5 mg/dL — ABNORMAL HIGH (ref 0.3–1.2)
Total Protein: 5.4 g/dL — ABNORMAL LOW (ref 6.5–8.1)

## 2020-01-29 LAB — TYPE AND SCREEN
ABO/RH(D): O POS
Antibody Screen: NEGATIVE
Unit division: 0
Unit division: 0
Unit division: 0
Unit division: 0

## 2020-01-29 LAB — GLUCOSE, CAPILLARY
Glucose-Capillary: 194 mg/dL — ABNORMAL HIGH (ref 70–99)
Glucose-Capillary: 199 mg/dL — ABNORMAL HIGH (ref 70–99)
Glucose-Capillary: 201 mg/dL — ABNORMAL HIGH (ref 70–99)
Glucose-Capillary: 205 mg/dL — ABNORMAL HIGH (ref 70–99)
Glucose-Capillary: 207 mg/dL — ABNORMAL HIGH (ref 70–99)
Glucose-Capillary: 212 mg/dL — ABNORMAL HIGH (ref 70–99)
Glucose-Capillary: 214 mg/dL — ABNORMAL HIGH (ref 70–99)
Glucose-Capillary: 221 mg/dL — ABNORMAL HIGH (ref 70–99)
Glucose-Capillary: 225 mg/dL — ABNORMAL HIGH (ref 70–99)
Glucose-Capillary: 233 mg/dL — ABNORMAL HIGH (ref 70–99)
Glucose-Capillary: 249 mg/dL — ABNORMAL HIGH (ref 70–99)
Glucose-Capillary: 252 mg/dL — ABNORMAL HIGH (ref 70–99)

## 2020-01-29 LAB — LACTATE DEHYDROGENASE
LDH: 655 U/L — ABNORMAL HIGH (ref 98–192)
LDH: 737 U/L — ABNORMAL HIGH (ref 98–192)

## 2020-01-29 LAB — PHOSPHORUS: Phosphorus: 5 mg/dL — ABNORMAL HIGH (ref 2.5–4.6)

## 2020-01-29 LAB — LACTIC ACID, PLASMA
Lactic Acid, Venous: 1.7 mmol/L (ref 0.5–1.9)
Lactic Acid, Venous: 1.7 mmol/L (ref 0.5–1.9)
Lactic Acid, Venous: 2.2 mmol/L (ref 0.5–1.9)

## 2020-01-29 LAB — MAGNESIUM: Magnesium: 3.3 mg/dL — ABNORMAL HIGH (ref 1.7–2.4)

## 2020-01-29 LAB — PROTIME-INR
INR: 2 — ABNORMAL HIGH (ref 0.8–1.2)
Prothrombin Time: 21.6 seconds — ABNORMAL HIGH (ref 11.4–15.2)

## 2020-01-29 LAB — SARS CORONAVIRUS 2 BY RT PCR (HOSPITAL ORDER, PERFORMED IN ~~LOC~~ HOSPITAL LAB): SARS Coronavirus 2: NEGATIVE

## 2020-01-29 LAB — FIBRINOGEN: Fibrinogen: <60 mg/dL — CL (ref 210–475)

## 2020-01-29 LAB — PLATELET COUNT: Platelets: 31 10*3/uL — ABNORMAL LOW (ref 150–400)

## 2020-01-29 MED ORDER — PIPERACILLIN-TAZOBACTAM 4.5 G IVPB
4.5000 g | Freq: Four times a day (QID) | INTRAVENOUS | Status: AC
  Administered 2020-01-29 – 2020-02-13 (×60): 4.5 g via INTRAVENOUS
  Filled 2020-01-29 (×65): qty 100

## 2020-01-29 MED ORDER — AMIODARONE LOAD VIA INFUSION
150.0000 mg | Freq: Once | INTRAVENOUS | Status: AC
  Administered 2020-01-29: 150 mg via INTRAVENOUS

## 2020-01-29 MED ORDER — SODIUM CHLORIDE 0.9% IV SOLUTION: Freq: Once | INTRAVENOUS | Status: AC

## 2020-01-29 MED ORDER — LIDOCAINE-EPINEPHRINE 2 %-1:100000 IJ SOLN
20.0000 mL | INTRAMUSCULAR | Status: AC
  Administered 2020-01-29: 20 mL via INTRADERMAL
  Filled 2020-01-29 (×2): qty 20

## 2020-01-29 NOTE — Consult Note (Signed)
Referring Physician: Critical care service  Patient name: Alisha Stephenson MRN: [id number] DOB: [birth year] Sex: female  REASON FOR CONSULT: Ischemic digits right hand  HPI: Alisha Stephenson is a 54 y.o. female, with COVID-19 who has been on ECMO for the last 14 days.  She has had slow worsening of duskiness of digits 1 and 2 on the right hand.  Risk factors include the fact that she is on ECMO without anticoagulation due to intracranial bleed recently.  She also has had some intermittent atrial fibrillation.  She is also on pressors.  Patient is sedated and on a ventilator.  Other medical problems include diabetes which is currently controlled.  Patient is also currently on continuous hemodialysis.  Past Medical History:  Diagnosis Date  . Asthma   . Diabetes mellitus without complication ([facility])   . Diverticulitis   . Gallstones   . IBS (irritable bowel syndrome)   . NAFLD (nonalcoholic fatty liver disease)    CT scan 2015  . Ovarian cyst    Past Surgical History:  Procedure Laterality Date  . ABDOMINAL HYSTERECTOMY    . CESAREAN SECTION    . CHOLECYSTECTOMY    . COLONOSCOPY     about 2011  . ECMO CANNULATION N/A 01/14/2020   Procedure: ECMO CANNULATION;  Surgeon: Jolaine Artist, MD;  Location: [facility];  Service: Cardiovascular;  Laterality: N/A;    Family History  Problem Relation Age of Onset  . Diabetes Father   . Heart disease Father   . Breast cancer Maternal Grandmother   . Breast cancer Maternal Aunt   . Colon cancer Neg Hx   . Esophageal cancer Neg Hx     SOCIAL HISTORY: Social History   Socioeconomic History  . Marital status: Married    Spouse name: Not on file  . Number of children: 2  . Years of education: Not on file  . Highest education level: Not on file  Occupational History  . Occupation: Therapist, art   Tobacco Use  . Smoking status: Former Research scientist (life sciences)  . Smokeless tobacco: Never Used  . Tobacco comment: quit over 10 years ago   Vaping Use  . Vaping Use: Never used  Substance and Sexual Activity  . Alcohol use: No    Comment: rarely  . Drug use: No  . Sexual activity: Never  Other Topics Concern  . Not on file  Social History Narrative  . Not on file   Social Determinants of Health   Financial Resource Strain:   . Difficulty of Paying Living Expenses: Not on file  Food Insecurity:   . Worried About Charity fundraiser in the Last Year: Not on file  . Ran Out of Food in the Last Year: Not on file  Transportation Needs:   . Lack of Transportation (Medical): Not on file  . Lack of Transportation (Non-Medical): Not on file  Physical Activity:   . Days of Exercise per Week: Not on file  . Minutes of Exercise per Session: Not on file  Stress:   . Feeling of Stress : Not on file  Social Connections:   . Frequency of Communication with Friends and Family: Not on file  . Frequency of Social Gatherings with Friends and Family: Not on file  . Attends Religious Services: Not on file  . Active Member of Clubs or Organizations: Not on file  . Attends Archivist Meetings: Not on file  . Marital Status: Not on file  Intimate Partner Violence:   . Fear of Current or Ex-Partner: Not on file  . Emotionally Abused: Not on file  . Physically Abused: Not on file  . Sexually Abused: Not on file    Allergies  Allergen Reactions  . Sulfa Antibiotics Rash and Other (See Comments)    Current Facility-Administered Medications  Medication Dose Route Frequency Provider Last Rate Last Admin  . 0.9 %  sodium chloride infusion (Manually program via Guardrails IV Fluids)   Intravenous Once Noemi Chapel P, DO      . 0.9 %  sodium chloride infusion (Manually program via Guardrails IV Fluids)   Intravenous Once Bensimhon, Shaune Pascal, MD      . 0.9 %  sodium chloride infusion (Manually program via Guardrails IV Fluids)   Intravenous Once Noemi Chapel P, DO      . 0.9 %  sodium chloride infusion   Intravenous PRN  Bensimhon, Shaune Pascal, MD 10 mL/hr at 01/21/20 1227 250 mL at 01/21/20 1227  . acetaminophen (TYLENOL) 160 MG/5ML solution 650 mg  650 mg Per Tube Q6H PRN Julian Hy, DO   650 mg at 01/25/20 2007  . albumin human 5 % solution 12.5 g  12.5 g Intravenous Q15 min PRN Bensimhon, Shaune Pascal, MD 60 mL/hr at 01/28/20 1229 12.5 g at 01/28/20 1229  . albumin human 5 % solution 12.5 g  12.5 g Intravenous Once Candee Furbish, MD      . albuterol (PROVENTIL) (2.5 MG/3ML) 0.083% nebulizer solution 2.5 mg  2.5 mg Nebulization Q4H PRN Bensimhon, Shaune Pascal, MD      . amiodarone (NEXTERONE PREMIX) 360-4.14 MG/200ML-% (1.8 mg/mL) IV infusion  30 mg/hr Intravenous Continuous Agarwala, Ravi, MD 27.8 mL/hr at 01/29/20 1400 50 mg/hr at 01/29/20 1400  . ascorbic acid (VITAMIN C) tablet 500 mg  500 mg Per Tube Daily Noemi Chapel P, DO   500 mg at 01/29/20 6283  . chlorhexidine gluconate (MEDLINE KIT) (PERIDEX) 0.12 % solution 15 mL  15 mL Mouth Rinse BID Bensimhon, Shaune Pascal, MD   15 mL at 01/29/20 0831  . Chlorhexidine Gluconate Cloth 2 % PADS 6 each  6 each Topical Daily Bensimhon, Shaune Pascal, MD   6 each at 01/29/20 0300  . clonazePAM (KLONOPIN) disintegrating tablet 1 mg  1 mg Per Tube Q6H Agarwala, Einar Grad, MD   1 mg at 01/29/20 1248  . dexmedetomidine (PRECEDEX) 400 MCG/100ML (4 mcg/mL) infusion  0.4-1.2 mcg/kg/hr Intravenous Titrated Kara Mead V, MD 27.1 mL/hr at 01/29/20 1400 0.8 mcg/kg/hr at 01/29/20 1400  . dextrose 50 % solution 0-50 mL  0-50 mL Intravenous PRN Noemi Chapel P, DO      . docusate (COLACE) 50 MG/5ML liquid 100 mg  100 mg Per Tube BID Rigoberto Noel, MD   100 mg at 01/29/20 6629  . feeding supplement (PIVOT 1.5 CAL) liquid 1,000 mL  1,000 mL Per Tube Continuous Audria Nine, DO 75 mL/hr at 01/29/20 0335 1,000 mL at 01/29/20 0335  . feeding supplement (PROSource TF) liquid 45 mL  45 mL Per Tube BID Audria Nine, DO   45 mL at 01/29/20 4765  . fentaNYL (SUBLIMAZE) 5000 mcg / 100 mL (50 mcg/mL)  infusion  0-400 mcg/hr Intravenous Continuous Bensimhon, Shaune Pascal, MD   Stopped at 01/27/20 0057  . fentaNYL (SUBLIMAZE) bolus via infusion 50 mcg  50 mcg Intravenous Q15 min PRN Bensimhon, Shaune Pascal, MD   50 mcg at 01/14/20 1455  . fentaNYL (SUBLIMAZE) bolus via  infusion 50 mcg  50 mcg Intravenous Q15 min PRN Bensimhon, Shaune Pascal, MD   50 mcg at 01/26/20 0515  . fentaNYL (SUBLIMAZE) injection 200 mcg  200 mcg Intravenous Once Candee Furbish, MD      . fentaNYL (SUBLIMAZE) injection 50 mcg  50 mcg Intravenous Once Bensimhon, Shaune Pascal, MD      . guaiFENesin-dextromethorphan (ROBITUSSIN DM) 100-10 MG/5ML syrup 10 mL  10 mL Per Tube Q4H PRN Noemi Chapel P, DO      . heparin injection 1,000-6,000 Units  1,000-6,000 Units CRRT PRN Reesa Chew, MD   2,400 Units at 01/28/20 1456  . hydrALAZINE (APRESOLINE) injection 10 mg  10 mg Intravenous Q1H PRN Bensimhon, Shaune Pascal, MD   10 mg at 01/22/20 1536  . hydrOXYzine (ATARAX/VISTARIL) tablet 25 mg  25 mg Per Tube TID PRN Noemi Chapel P, DO   25 mg at 01/24/20 0509  . insulin regular, human (MYXREDLIN) 100 units/ 100 mL infusion   Intravenous Continuous Noemi Chapel P, DO 5 mL/hr at 01/29/20 1400 Rate Verify at 01/29/20 1400  . linagliptin (TRADJENTA) tablet 5 mg  5 mg Per Tube Daily Noemi Chapel P, DO   5 mg at 01/29/20 1415  . MEDLINE mouth rinse  15 mL Mouth Rinse 10 times per day Bensimhon, Shaune Pascal, MD   15 mL at 01/29/20 1400  . midazolam (VERSED) injection 5 mg  5 mg Intravenous Once Candee Furbish, MD      . milrinone (PRIMACOR) 20 MG/100 ML (0.2 mg/mL) infusion  0.125 mcg/kg/min Intravenous Continuous Agarwala, Einar Grad, MD 5.51 mL/hr at 01/29/20 1400 0.125 mcg/kg/min at 01/29/20 1400  . norepinephrine (LEVOPHED) 16 mg in 218m premix infusion  0-40 mcg/min Intravenous Titrated Bensimhon, DShaune Pascal MD 4.69 mL/hr at 01/29/20 1400 5 mcg/min at 01/29/20 1400  . nutrition supplement (JUVEN) (JUVEN) powder packet 1 packet  1 packet Per Tube BID BM MAudria Nine DO   1 packet at 01/29/20 1415  . ondansetron (ZOFRAN) tablet 4 mg  4 mg Oral Q6H PRN Bensimhon, DShaune Pascal MD       Or  . ondansetron ([facility] injection 4 mg  4 mg Intravenous Q6H PRN Bensimhon, DShaune Pascal MD   4 mg at 01/09/20 0224  . oxyCODONE (ROXICODONE) 5 MG/5ML solution 10 mg  10 mg Per Tube Q6H SCandee Furbish MD   10 mg at 01/29/20 08185 . piperacillin-tazobactam (ZOSYN) IVPB 4.5 g  4.5 g Intravenous Q6H SCarlyle Basques MD   Stopped at 01/29/20 1318  . polyethylene glycol (MIRALAX / GLYCOLAX) packet 17 g  17 g Per Tube Daily Bensimhon, DShaune Pascal MD   17 g at 01/29/20 06314 . prismasol BGK 2/2.5 dialysis solution   CRRT Continuous PReesa Chew MD 2,000 mL/hr at 01/29/20 0540 New Bag at 01/29/20 0540  . prismasol BGK 2/2.5 replacement solution   CRRT Continuous PReesa Chew MD 500 mL/hr at 01/29/20 0958 New Bag at 01/29/20 0958  . prismasol BGK 2/2.5 replacement solution   CRRT Continuous PReesa Chew MD 300 mL/hr at 01/29/20 0958 New Bag at 01/29/20 0958  . sennosides (SENOKOT) 8.8 MG/5ML syrup 5 mL  5 mL Per Tube BID ARigoberto Noel MD   5 mL at 01/29/20 0832  . sodium chloride 0.9 % primer fluid for CRRT  500 mL CRRT PRN PReesa Chew MD      . vasopressin (PITRESSIN) 20 Units in sodium chloride 0.9 % 100 mL infusion-*FOR  SHOCK*  0-0.04 Units/min Intravenous Continuous Agarwala, Ravi, MD 12 mL/hr at 01/29/20 1400 0.04 Units/min at 01/29/20 1400  . vecuronium (NORCURON) injection 10 mg  10 mg Intravenous Once Candee Furbish, MD      . zinc sulfate capsule 220 mg  220 mg Per Tube Daily Julian Hy, DO   220 mg at 01/29/20 4259    ROS:   Unable to obtain patient on ventilator  Physical Examination  Vitals:   01/29/20 0800 01/29/20 0900 01/29/20 1000 01/29/20 1334  BP: (!) 133/59 (!) 137/48 (!) 124/49 (!) 102/40  Pulse: 83 97 95 73  Resp: (!) 46 (!) 40 (!) 63 (!) 53  Temp: (!) 97.5 F (36.4 C) (!) 97.5 F (36.4 C) (!) 97.3 F (36.3 C) (!) 95.9  F (35.5 C)  TempSrc: Bladder     SpO2: (!) 87% (!) 75% (!) 81% 91%  Weight:      Height:        Body mass index is 59.22 kg/m.  General: Sedated on ventilator Neck: ECMO cannulas right neck Cardiac: Regular Rate and Rhythm Skin: No rash, dusky distal and middle phalanx right first digit with early blistering, similar findings distal phalanx right second finger Extremity Pulses: Patient does not have palpable pulses secondary to edema, she has biphasic Doppler flow in the radial ulnar and superficial palmar arch.   Musculoskeletal: Diffuse anasarca   DATA:  CBC    Component Value Date/Time   WBC 9.3 01/29/2020 0451   RBC 2.81 (L) 01/29/2020 0451   HGB 8.5 (L) 01/29/2020 0744   HCT 25.0 (L) 01/29/2020 0744   PLT 29 (LL) 01/29/2020 0451   MCV 101.8 (H) 01/29/2020 0451   MCH 30.6 01/29/2020 0451   MCHC 30.1 01/29/2020 0451   RDW 22.8 (H) 01/29/2020 0451   LYMPHSABS 0.9 01/13/2020 0500   MONOABS 0.3 01/13/2020 0500   EOSABS 0.0 01/13/2020 0500   BASOSABS 0.0 01/13/2020 0500    BMET    Component Value Date/Time   NA 145 01/29/2020 0744   K 4.7 01/29/2020 0744   CL 109 01/29/2020 0451   CO2 25 01/29/2020 0451   GLUCOSE 211 (H) 01/29/2020 0451   BUN 151 (H) 01/29/2020 0451   CREATININE 1.45 (H) 01/29/2020 0451   CALCIUM 8.6 (L) 01/29/2020 0451   GFRNONAA 40 (L) 01/29/2020 0451   GFRAA 47 (L) 01/29/2020 0451     ASSESSMENT: Patient with ischemia of digits right hand 1 and 2 most likely secondary to distal emboli or potentially from pressors although she does not have any ischemic findings in her other extremities.  She currently is not on anticoagulation for her ECMO and certainly is at risk for embolization from this although a venous circuit.  Covid patients have also been known to develop arterial thrombosis and certainly this is a possibility as well or even distal emboli secondary to her atrial fibrillation.  Unfortunately she recently had a intracranial bleed and  she is not really a great candidate for anticoagulation.  Clinically she appears to have large vessel patency.  I believe the best option at this point will be to try to wean her pressor as possible.  Continue to watch her right hand and most likely this will eventually demarcate.  She most likely will lose digits 1 and 2 but has no urgent need for amputation at this point.   PLAN: Protect right hand from trauma  Wean pressors if possible  Consideration for heparin if patient becomes  anticoagulation candidate in the future however, with her recent intracranial bleed I would not consider this currently as I do believe it would be only of minimal benefit versus risk of furthering intracranial bleed  I will check on her again later this week   Ruta Hinds, MD [facility]: [phone]

## 2020-01-29 NOTE — Progress Notes (Signed)
Assisted tele visit to patient with family member.  Jerrin Recore M, RN   

## 2020-01-29 NOTE — Progress Notes (Signed)
[facility] for Infectious Disease    Date of Admission:  01/18/2020      ID: Alisha Stephenson is a 54 y.o. female with   Principal Problem:   Acute respiratory distress syndrome (ARDS) due to COVID-19 virus [facility]) Active Problems:   NAFLD (nonalcoholic fatty liver disease)   Diabetes mellitus type 1.5, managed as type 1 ([name])   Pneumonia due to COVID-19 virus   Intracerebral hemorrhage   Personal history of ECMO   Palliative care by specialist   Goals of care, counseling/discussion    Subjective: Remains extubated, oozing from catheter sites  Medications:  . sodium chloride   Intravenous Once  . sodium chloride   Intravenous Once  . vitamin C  500 mg Per Tube Daily  . chlorhexidine gluconate (MEDLINE KIT)  15 mL Mouth Rinse BID  . Chlorhexidine Gluconate Cloth  6 each Topical Daily  . clonazePAM  1 mg Per Tube Q6H  . docusate  100 mg Per Tube BID  . feeding supplement (PROSource TF)  45 mL Per Tube BID  . fentaNYL (SUBLIMAZE) injection  200 mcg Intravenous Once  . fentaNYL (SUBLIMAZE) injection  50 mcg Intravenous Once  . linagliptin  5 mg Per Tube Daily  . mouth rinse  15 mL Mouth Rinse 10 times per day  . midazolam  5 mg Intravenous Once  . nutrition supplement (JUVEN)  1 packet Per Tube BID BM  . oxyCODONE  10 mg Per Tube Q6H  . polyethylene glycol  17 g Per Tube Daily  . sennosides  5 mL Per Tube BID  . vecuronium  10 mg Intravenous Once  . zinc sulfate  220 mg Per Tube Daily    Objective: Vital signs in last 24 hours: Temp:  [95.9 F (35.5 C)-99 F (37.2 C)] 95.9 F (35.5 C) (08/23 1334) Pulse Rate:  [73-130] 73 (08/23 1334) Resp:  [13-65] 53 (08/23 1334) BP: (90-158)/(40-121) 102/40 (08/23 1334) SpO2:  [75 %-95 %] 91 % (08/23 1334) Arterial Line BP: (79-124)/(56-106) 84/66 (08/23 1000) FiO2 (%):  [100 %] 100 % (08/23 1334) Weight:  [156.5 kg] 156.5 kg (08/23 0341) gen = remains eyes closed, husband at bedside Neck = ECMO cannula in place pulm =  mild rhonchi bilaterally Cors = nl s1,s2, no g/m/r Abd= distended, decrease BS sounds Ext= anasarca, scattered echymosis, Lab Results Recent Labs    01/28/20 1559 01/28/20 1619 01/28/20 2037 01/28/20 2354 01/29/20 0451 01/29/20 0451 01/29/20 0637 01/29/20 0744  WBC 9.9   < >  --  7.9 9.3  --   --   --   HGB 7.7*   < >   < > 8.5* 8.6*   < > 8.2* 8.5*  HCT 26.3*   < >   < > 28.1* 28.6*   < > 24.0* 25.0*  NA 149*   < >   < >  --  147*   < > 146* 145  K 5.9*   < >   < >  --  4.8   < > 4.2 4.7  CL 110  --   --   --  109  --   --   --   CO2 22  --   --   --  25  --   --   --   BUN 200*  --   --   --  151*  --   --   --   CREATININE 1.90*  --   --   --  1.45*  --   --   --    < > = values in this interval not displayed.   Liver Panel Recent Labs    01/28/20 0308 01/29/20 0451  PROT 5.3* 5.4*  ALBUMIN 2.8* 3.2*  AST 35 65*  ALT 61* 59*  ALKPHOS 140* 137*  BILITOT 1.4* 1.5*  BILIDIR 0.3* 0.3*  IBILI 1.1* 1.2*   Microbiology: 8/19 blood cx ngtd 8/15 blood cx ngtd 8/12 blood enterococcus Studies/Results: DG Chest 1 View  Result Date: 01/28/2020 CLINICAL DATA:  COVID-19 positive, ECMO, central line thrombosis EXAM: CHEST  1 VIEW COMPARISON:  Chest radiograph from earlier today. FINDINGS: Stable right internal jugular ECMO catheter entering the IVC. Left subclavian central venous catheter terminates over the high left mediastinum, unchanged. Left internal jugular central venous catheter is stable with tip in the region of middle third of the SVC. Right PICC enters the SVC with the tip not well visualized. Enteric tube enters the stomach with the tip not seen on this image. No pneumothorax. Complete opacification of the hemithoraces bilaterally, unchanged. IMPRESSION: 1. Stable support structures. No pneumothorax. Left subclavian central venous catheter tip overlies the high left mediastinum, unchanged, indeterminate in location as described on prior chest radiograph, potentially at  the junction of the left internal jugular and left subclavian veins. 2. Stable complete opacification of the hemithoraces bilaterally. Electronically Signed   By: Ilona Sorrel M.D.   On: 01/28/2020 16:25   DG Chest 1 View  Result Date: 01/28/2020 CLINICAL DATA:  Status post line placement today. Shortness of breath. COVID-19 pneumonia. EXAM: CHEST  1 VIEW COMPARISON:  Single-view of the chest 01/28/2020. FINDINGS: New left subclavian central venous catheter tip projects medial to the left clavicular head. The catheter crosses the left IJ catheter and its position is unclear. It may be indenting medial wall of the junction of the left internal jugular and subclavian veins. ECMO cannula, feeding tube and left IJ catheter are unchanged. There is complete whiteout of the chest bilaterally. Cardiac silhouette is not visible. IMPRESSION: Position of the tip of the patient's left subclavian catheter tip is indeterminate. It may be indenting the medial wall of the junction of the left subclavian and left internal jugular veins. No change in complete whiteout of the chest. Critical Value/emergent results were called by telephone at the time of interpretation on 01/28/2020 at 3:29 pm to provider [facility] , who verbally acknowledged these results. Electronically Signed   By: Inge Rise M.D.   On: 01/28/2020 15:33   DG CHEST PORT 1 VIEW  Result Date: 01/29/2020 CLINICAL DATA:  COVID 19 virus infection. Acute respiratory failure. On ECMO. EXAM: PORTABLE CHEST 1 VIEW COMPARISON:  01/28/2020 FINDINGS: Left subclavian central venous catheter tip is again seen overlying the distal left subclavian vein. Left jugular central venous catheter tip remains in the proximal SVC. Feeding tube remains in place as well as ECMO catheter. Complete opacification of bilateral hemi-thoraces again seen, without significant change. IMPRESSION: Complete opacification of bilateral hemi-thoraces, without significant change. Stable  support lines and tubes. Electronically Signed   By: Marlaine Hind M.D.   On: 01/29/2020 07:41   DG CHEST PORT 1 VIEW  Result Date: 01/28/2020 CLINICAL DATA:  Severe acute respiratory failure.  COVID-19. EXAM: PORTABLE CHEST 1 VIEW COMPARISON:  None. FINDINGS: An ECMO cannula is again identified. The feeding tube terminates below today's film. The left IJ is stable terminating in the SVC. Complete opacification of the lungs bilaterally obscuring the heart mediastinal  contours remains. No significant change. No pneumothorax. A right PICC line probably terminates in the right subclavian vein, unchanged. Orthopedic fixation hardware remains in the lower cervical spine, only partly imaged today. IMPRESSION: 1. Support apparatus as above. 2. Complete opacification of the lungs bilaterally, unchanged in the interval. Electronically Signed   By: Dorise Bullion III M.D   On: 01/28/2020 08:25     Assessment/Plan: Enterococcus bacteremia = unclear where her original source. Has had picc line changed out since then. Has not had recurrence. Due to PK dynamics with ampicillin and ECMO --> we will change back to piptazo  Thrombocytopenia = drifted down and stable at 29. Unclear if related to medications, agree with team's approach to review rather than change circuit as the main cause. I don't think it is related to piptazo for which her platelets had been low in the setting of illness, however has not rebounded  covid -19 isolation = she is roughly day 28 days since symptom onset, and 21 days at hospital, repeat test is negative. Can discontinue isolation precautions.  [facility] for Infectious Diseases Cell: [phone] Pager: [phone]  01/29/2020, 2:18 PM

## 2020-01-29 NOTE — CV Procedure (Signed)
ECMO NOTE:  Indication: Respiratory failure due to COVID PNA  Initial cannulation date: 02/01/2020  ECMO type: VV ECMO (Centrimag with oximizer)  Dual lumen Inflow/return cannula: 32 FR RIJ [place] placed 01/23/2020  ECMO events:  - Initial cannulation 01/13/2020 - Cannula repositioned (pulled back ) 01/17/20 - Off bival 8/13 due to [place]  Daily data:  Flow5.2 RPM 4200 Sweep13  Labs:  ABG    Component Value Date/Time   PHART 7.387 01/29/2020 1749   PCO2ART 42.7 01/29/2020 1749   PO2ART 50 (L) 01/29/2020 1749   HCO3 26.0 01/29/2020 1749   TCO2 27 01/29/2020 1749   ACIDBASEDEF 1.0 01/27/2020 1954   O2SAT 87.0 01/29/2020 1749    Sodium remains on higher side (goal 145-155 given cerebral edema) BG remain high despite insulin drip BUN/Cr improved on CRRT LDH 547> 655 Fibrinogen back down below 60-- remains there since 8/19 INR 2.5>>1.9>>2.0> 2.0 PTT 43 HgB 8.5>>8.2>>8.7> 8/8 Plts 34>>37>>35> 30> 29 WBC 13>>11>>11> 9.9> 7.9> 9.3   Assessment: -Severe COVID ARDS s/p VV ECMO -Enterococcal bacteremia -MSSA pneumonia -Thrombocytopenia- some combination of hemolysis, bone marrow stunning, and sepsis effect, possibly contribution of chronic liver dysfunction remains possible - Intracerebral hemorrhage with question of septic emboli - Muscular deconditioning - Acute kidney failure -Likely ischemic digits due to embolic phenomenon on right upper extremity  Plan: -Continue VV ECMO support -Continue heated high flow nasal cannula oxygen -If requires circuit change will require repeat endotracheal intubation unless significantly improved ECMO requirements -Hold on AC for now-continues to tolerate adequate flows, LDH relatively stable, circuit clots stable on exam -Continue CRRT  -Continue insulin gtt-does not observe subcu insulin -Dual beta lactam therapy per ID, TEE at some point when safer.  Continue empirically treating for endocarditis given concern for vascular  emboli. -PT & OT consults. -Limited code, daily discussions with family -Rechecking for Covid today to determine if she requires ongoing isolation precautions.  Multidisciplinary rounds with cardiology, critical care medicine, cardiothoracic surgery, coordinator, PharmDs, RTs, and all nurses present.  Julian Hy, DO 01/29/20 6:13 PM [facility]

## 2020-01-29 NOTE — Progress Notes (Signed)
Pharmacy Antibiotic Note  Alisha Stephenson is a 54 y.o. female admitted on 02/06/2020 with COVID pneumonia now s/p VV ECMO cannulation on 01/21/2020.    Found to have staph aureus (MSSA) + enterococcus faecalis (pan-sen). Patient has been on high-dose Zosyn given pharmacokinetic properties with ECMO. Now with concern of endocarditis.   She was switched to double beta-lactam antibiotics over the weekend with ampicillin and ceftriaxone. However, with literature showing worse outcomes with ampicillin use in ECMO patients and increased sequestration, pharmacy asked to change back to high-dose Zosyn. There is some evidence of sequestration with ceftriaxone, but there are no dosage adjustments recommended. Also started on CRRT over the weekend.  Plan: Stop Ampicillin Restart Zosyn 4.5g IV q6 hrs Continue Ceftriaxone 2gm IV Q12 hrs Monitor clinical progression, cultures, CRRT duration    Height: 5\' 4"  (162.6 cm) Weight: (!) 156.5 kg (345 lb 0.3 oz) IBW/kg (Calculated) : 54.7  Temp (24hrs), Avg:97.5 F (36.4 C), Min:95.7 F (35.4 C), Max:99 F (37.2 C)  Recent Labs  Lab 01/27/20 0410 01/27/20 0410 01/27/20 1504 01/27/20 1505 01/28/20 0308 01/28/20 1559 01/28/20 1600 01/28/20 2354 01/29/20 0451 01/29/20 0823  WBC 11.8*   < >  --  11.0* 10.8* 9.9  --  7.9 9.3  --   CREATININE 1.81*  --   --  1.77* 1.87* 1.90*  --   --  1.45*  --   LATICACIDVEN 1.9   < > 2.0*  --  2.9*  --  2.0*  --  1.7 1.7   < > = values in this interval not displayed.    Estimated Creatinine Clearance: 66 mL/min (A) (by C-G formula based on SCr of 1.45 mg/dL (H)).    Allergies  Allergen Reactions  . Sulfa Antibiotics Rash and Other (See Comments)    Antimicrobials this admission: Remdesivir 8/2 >> 8/6 Azithromycin 8/3 >> 8/6 CTX 8/3 >> 8/6; 8/22 Vancomycin 8/12 >> 8/15, 8/19>>8/22 Meropenem 8/12 >> 8/13 Zosyn 8/13 >> 8/22; 8/23 >> Fluconazole 8/19 >>8/22 Ampicillin 8/22>>8/23  Dose adjustments this  admission: VT 25 - adjust regimen to vanc 1g IV every 12 hours  Microbiology results: 8/19 KCL:EXNT 8/15 BCx: neg 8/12 BCx: enterococcus (pan-sens) 8/12 TA: MSSA 8/4 TA: neg 8/4 UCx: neg 8/2 BCx: neg 8/4 MRSA PCR neg 8/2 COVID pos  Richardine Service, PharmD PGY2 Cardiology Pharmacy Resident Phone: [phone] 01/29/2020  10:13 AM  Please check [email or web address] for unit-specific pharmacy phone numbers.

## 2020-01-29 NOTE — Progress Notes (Signed)
Right upper ext arterial duplex  has been completed. Refer to [facility] under chart review to view preliminary results.   01/29/2020  2:41 PM Alisha Stephenson, Bonnye Fava

## 2020-01-29 NOTE — Progress Notes (Signed)
NAME:  Alisha Stephenson, MRN:  [id number], DOB:  1965/06/15, LOS: 21 ADMISSION DATE:  01/28/2020, CONSULTATION DATE:  8/3 REFERRING MD:  Alfredia Ferguson, CHIEF COMPLAINT:  Dyspnea   Brief History   54 y/o female admitted on 8/2 with severe acute respiratory failure with hypoxemia due to COVID 19 pneumonia.  She developed symptoms 1 week prior to admission.  Past Medical History  DM2 Diverticulitis Gallstones Ovarian cyst NAFLD Asthma  Significant Hospital Events   8/2 admit 8/3 ICU transfer, intubated 8/4 prone, paralyze 8/9 significant desaturations today 8/9 VV ECMO cannulation 8/13 head bleed  Consults:  PCCM ECMO team    Procedures:  8/3 ETT >  8/3 PICC >  8/9 LIJ MML 8/9 RIJ [place] 14F   Significant Diagnostic Tests:  7/31 CT head > NAICP 7/31 MRI/MRA brain > no acute changes, possibly small aneurysm ACOM 8/13 CT head> multiple areas of ICH  Micro Data:  8/2 blood > NG 8/2 SARS COV 2 > positive 8/4 resp > negative 8/4 urine >  8/12 blood > E. faecalis (pan-sensitive) 8/12 resp: staph aureus> MSSA  Antimicrobials:  8/2 remdesivir > 8/6 8/2 actemra  8/2 solumedrol >   8/3 ceftriaxone >  8/5 8/3 azithro >  8/5  8/12 meropenem>8/16 8/12 vanc>8/16  8/13 zosyn >> 8/20 (planned) Vanc 8/19>> 8/22 Fluconazole 8/19>> 8/22  Interim history/subjective:  Bleeding from dialysis catheter overnight. Remains off AC due to low platelets. Extubated, on sedation due to hypoxia associated with high work of breathing.  Objective   Blood pressure (!) 117/57, pulse 81, temperature (!) 97.2 F (36.2 C), resp. rate (!) 60, height 5\' 4"  (1.626 m), weight (!) 156.5 kg, SpO2 90 %. CVP:  [22 mmHg-25 mmHg] 22 mmHg  FiO2 (%):  [100 %] 100 %   Intake/Output Summary (Last 24 hours) at 01/29/2020 0707 Last data filed at 01/29/2020 0700 Gross per 24 hour  Intake 5534.82 ml  Output 4390 ml  Net 1144.82 ml   Filed Weights   01/27/20 0437 01/28/20 0500 01/29/20 0341  Weight: (!)  151.1 kg (!) 152 kg (!) 156.5 kg    Examination:  GEN: critically ill-appearing woman lying in bed on ECMO, sedated HEENT: [place]/AT, eyes anicteric CV: Regular rate and rhythm, no murmurs. PULM: Tachypnea without accessory muscle use, faint rales bilaterally.  On heated high flow [place] 30 L, 100% GI: Soft, nontender, nondistended EXT: Trace diffuse anasarca.  Cyanosis of right distal thumb, distal index finger, middle finger. NEURO: PERRL, RASS -4 on sedation. SKIN: Cyanosis as above, no petechiae.  Oozing around left subclavian dialysis catheter.  Labs reviewed CXR 8/23 personally reviewed: Session, minimal aerated lung bilaterally.   Resolved Hospital Problem list     Assessment & Plan:  Acute hypoxic and AoC hypercapneic respiratory failure; likely underly OHS (baseline bicarb 27-30) ARDS due to COVID 19 pneumonia   VV ECMO cannulation on 8/9 for refractory hypoxemia MSSA pneumonia -Continue VV ECMO -Monitor ability to protect airway on sedation required to control work of breathing, which is affecting saturations. -Heated high flow nasal cannula supplemental oxygen -Physical therapy, occupational therapy.  Enterococcal bacteremia-recent blood cultures cleared Concern for possible embolic phenomenon- fingers and possibly CVAs were embolic  -Appreciate infectious disease team's assistance.  Continue ceftriaxone and ampicillin. -Right radial Doppler ultrasound and vascular surgery consult today  Elevated LDH, hypo-fibrinogenemia and thrombocytopenia; most likely due to dedication side effect versus sepsis versus chronic hepatic disease less likely. Hb stable and low platelets but no schistocytes on smear  suggest DIC is not cause.   -Continue to monitor platelet count -Discontinue quetiapine, pantoprazole.  Vancomycin, penicillin based antibiotics also potentially cause thrombocytopenia, but B-lactam antibiotics cannot be discontinued at this time.  ICH, multifocal. L-sided  weakness.  Encephalopathy- ICU delirium + ischemic injury + azotemia - Reorient as able - Intubation will not significantly help O2 saturations, keep extubated if at all possible - Very coagulopathic, holding on [facility] for now given good flows in circuit  Acute renal failure, severe uremia -Continue CRRT -Strict I's/O -Renally dose medications and avoid nephrotoxic meds.  Shock related to sedation and RV stunning- milrinone and PRN norepinephrine Septic shock- Enterococcus bacteremia Increased WBC, hypotension and question sepsis 8/19, question of septic emboli to explain head bleeds: Recent cultures negative to date.  ID following   Hyperglycemia > not controlled at all with [place] insulin despite aggressive upward titration.  Assume poor absorption, potentially due to subcutaneous edema.   Remains elevated on IV. -Remains on insulin infusion -Goal BG 140-180 while admitted to the ICU -Continue linagliptin to decrease insulin requirements  Constipation, resolved  Hypernatremia  -Free water held; goal sodium 145-1 50  Acute anemia due to critical illness -transfuse for Hb <8 -con't to monitor    Best practice:  Diet: tube feeding Pain/Anxiety/Delirium protocol (if indicated): as above VAP protocol (if indicated): yes DVT prophylaxis: SCDs while bivalirudin off GI prophylaxis:  Glucose control:  insulin infusion  Mobility: bed rest Code Status: limited, intubation okay but no shocks or compressions Family Communication: updated by team Disposition: ICU  This patient is critically ill with multiple organ system failure which requires frequent high complexity decision making, assessment, support, evaluation, and titration of therapies. This was completed through the application of advanced monitoring technologies and extensive interpretation of multiple databases. During this encounter critical care time was devoted to patient care services described in this note for 55  minutes.   Julian Hy, DO 01/29/20 10:58 AM [facility]

## 2020-01-29 NOTE — Progress Notes (Signed)
Assisted tele visit to patient with family member.  Reagann Dolce D Jomaira Darr, RN   

## 2020-01-29 NOTE — Progress Notes (Addendum)
Advanced Heart Failure Rounding Note   Subjective:    - 8/2 COVID + test - 8/9 Cannulated for VV ECMO - 8/13 with several areas of intracranial hemorrhage. Bival stopped.  - 8/14 CT no change in [place]. Increased edema - 8/16 Extubated - 8/16 Head CT stable bleed - 8/22 CVVHD started  Awake on vent. Intermittently following commands.   Starting on CVVHD yesterday. Pulling -200. Oozing from sight.   On NE and VP for BP support.   PLTs 31K -> 38k -> 37k -> 35k -> 29k. Fibrinogen < 60  ECMO   Flow 5.2L RPM 4200 Sweep13  Labs:  7.45/35/59/92% Hgb8.8 PLT 29k LDH 429 -> 451 -> 576 -> 527 -> 547 -> 655 PTT => off bival Lactic acid1.6-> 1.5 -> 2.0 -> 1.7 -> 3.5 -> 2.8 -> 1.9 -> 2.9 -> 2.0 Fibrinogen < 60  Objective:   Weight Range:  Vital Signs:   Temp:  [95.5 F (35.3 C)-99 F (37.2 C)] 97.3 F (36.3 C) (08/23 0727) Pulse Rate:  [76-130] 83 (08/23 0727) Resp:  [0-65] 40 (08/23 0727) BP: (90-158)/(43-121) 117/57 (08/23 0727) SpO2:  [76 %-95 %] 88 % (08/23 0727) Arterial Line BP: (79-128)/(47-106) 94/81 (08/23 0715) FiO2 (%):  [100 %] 100 % (08/23 0727) Weight:  [156.5 kg] 156.5 kg (08/23 0341) Last BM Date: 01/28/20 (RT)  Weight change: Filed Weights   01/27/20 0437 01/28/20 0500 01/29/20 0341  Weight: (!) 151.1 kg (!) 152 kg (!) 156.5 kg    Intake/Output:   Intake/Output Summary (Last 24 hours) at 01/29/2020 0733 Last data filed at 01/29/2020 0700 Gross per 24 hour  Intake 5534.82 ml  Output 4390 ml  Net 1144.82 ml     Physical Exam: General:  Awake on vent. Ill appearing. No resp difficulty HEENT: normal Neck: supple. RIJ ECMO cannula. Cor: PMI nondisplaced. Regular rate & rhythm. No rubs, gallops or murmurs. Left subclavian trialysis cath Lungs: minimal air movement Abdomen: obese soft, nontender, nondistended. No hepatosplenomegaly. No bruits or masses. Good bowel sounds. Extremities: no cyanosis, clubbing, rash, 2+ edema Tips of R thumb  and first 2 finger are increasingly cyanotic Neuro: sedated will awake and follow commands at times     Telemetry: Sinus 80-90 Personally reviewed   Labs: Basic Metabolic Panel: Recent Labs  Lab 01/23/20 0436 01/23/20 0850 01/27/20 0410 01/27/20 0412 01/27/20 1505 01/27/20 1505 01/27/20 1816 01/27/20 2221 01/28/20 0308 01/28/20 0528 01/28/20 1559 01/28/20 1619 01/28/20 2037 01/29/20 0451 01/29/20 0637  NA 148*   < > 151*   < > 151*   < >   < >  --  148*   < > 149* 150* 148* 147* 146*  K 4.1   < > 5.0   < > 5.3*   < >   < >  --  4.8   < > 5.9* 5.6* 4.9 4.8 4.2  CL 106   < > 111  --  110  --   --   --  110  --  110  --   --  109  --   CO2 31   < > 25  --  25  --   --   --  26  --  22  --   --  25  --   GLUCOSE 286*   < > 368*  --  457*  --   --   --  308*  --  207*  --   --  211*  --  BUN 97*   < > 171*  --  183*  --   --   --  188*  --  200*  --   --  151*  --   CREATININE 1.13*   < > 1.81*  --  1.77*  --   --   --  1.87*  --  1.90*  --   --  1.45*  --   CALCIUM 8.9   < > 9.1   < > 9.0   < >  --   --  9.3  --  9.1  --   --  8.6*  --   MG 2.9*  --   --   --   --   --   --  3.2*  --   --   --   --   --  3.3*  --   PHOS  --   --   --   --   --   --   --   --   --   --   --   --   --  5.0*  --    < > = values in this interval not displayed.    Liver Function Tests: Recent Labs  Lab 01/25/20 0410 01/26/20 0401 01/27/20 0410 01/28/20 0308 01/29/20 0451  AST 35 52* 40 35 65*  ALT 53* 61* 61* 61* 59*  ALKPHOS 111 151* 135* 140* 137*  BILITOT 1.7* 1.7* 2.0* 1.4* 1.5*  PROT 5.3* 5.2* 5.0* 5.3* 5.4*  ALBUMIN 3.3* 3.1* 2.8* 2.8* 3.2*   No results for input(s): LIPASE, AMYLASE in the last 168 hours. No results for input(s): AMMONIA in the last 168 hours.  CBC: Recent Labs  Lab 01/27/20 1505 01/27/20 1816 01/28/20 0308 01/28/20 0528 01/28/20 1559 01/28/20 1559 01/28/20 1619 01/28/20 2037 01/28/20 2354 01/29/20 0451 01/29/20 0637  WBC 11.0*  --  10.8*  --  9.9   --   --   --  7.9 9.3  --   HGB 8.6*   < > 8.8*   < > 7.7*   < > 7.5* 8.2* 8.5* 8.6* 8.2*  HCT 28.8*   < > 28.9*   < > 26.3*   < > 22.0* 24.0* 28.1* 28.6* 24.0*  MCV 103.6*  --  104.7*  --  103.5*  --   --   --  102.6* 101.8*  --   PLT 35*  --  35*  --  30*  --   --   --  29* 29*  --    < > = values in this interval not displayed.    Cardiac Enzymes: No results for input(s): CKTOTAL, CKMB, CKMBINDEX, TROPONINI in the last 168 hours.  BNP: BNP (last 3 results) No results for input(s): BNP in the last 8760 hours.  ProBNP (last 3 results) No results for input(s): PROBNP in the last 8760 hours.    Other results:  Imaging: DG Chest 1 View  Result Date: 01/28/2020 CLINICAL DATA:  COVID-19 positive, ECMO, central line thrombosis EXAM: CHEST  1 VIEW COMPARISON:  Chest radiograph from earlier today. FINDINGS: Stable right internal jugular ECMO catheter entering the IVC. Left subclavian central venous catheter terminates over the high left mediastinum, unchanged. Left internal jugular central venous catheter is stable with tip in the region of middle third of the SVC. Right PICC enters the SVC with the tip not well visualized. Enteric tube enters the stomach with the tip not seen on this image.  No pneumothorax. Complete opacification of the hemithoraces bilaterally, unchanged. IMPRESSION: 1. Stable support structures. No pneumothorax. Left subclavian central venous catheter tip overlies the high left mediastinum, unchanged, indeterminate in location as described on prior chest radiograph, potentially at the junction of the left internal jugular and left subclavian veins. 2. Stable complete opacification of the hemithoraces bilaterally. Electronically Signed   By: Ilona Sorrel M.D.   On: 01/28/2020 16:25   DG Chest 1 View  Result Date: 01/28/2020 CLINICAL DATA:  Status post line placement today. Shortness of breath. COVID-19 pneumonia. EXAM: CHEST  1 VIEW COMPARISON:  Single-view of the chest  01/28/2020. FINDINGS: New left subclavian central venous catheter tip projects medial to the left clavicular head. The catheter crosses the left IJ catheter and its position is unclear. It may be indenting medial wall of the junction of the left internal jugular and subclavian veins. ECMO cannula, feeding tube and left IJ catheter are unchanged. There is complete whiteout of the chest bilaterally. Cardiac silhouette is not visible. IMPRESSION: Position of the tip of the patient's left subclavian catheter tip is indeterminate. It may be indenting the medial wall of the junction of the left subclavian and left internal jugular veins. No change in complete whiteout of the chest. Critical Value/emergent results were called by telephone at the time of interpretation on 01/28/2020 at 3:29 pm to provider [facility] , who verbally acknowledged these results. Electronically Signed   By: Inge Rise M.D.   On: 01/28/2020 15:33   DG CHEST PORT 1 VIEW  Result Date: 01/28/2020 CLINICAL DATA:  Severe acute respiratory failure.  COVID-19. EXAM: PORTABLE CHEST 1 VIEW COMPARISON:  None. FINDINGS: An ECMO cannula is again identified. The feeding tube terminates below today's film. The left IJ is stable terminating in the SVC. Complete opacification of the lungs bilaterally obscuring the heart mediastinal contours remains. No significant change. No pneumothorax. A right PICC line probably terminates in the right subclavian vein, unchanged. Orthopedic fixation hardware remains in the lower cervical spine, only partly imaged today. IMPRESSION: 1. Support apparatus as above. 2. Complete opacification of the lungs bilaterally, unchanged in the interval. Electronically Signed   By: Dorise Bullion III M.D   On: 01/28/2020 08:25     Medications:     Scheduled Medications: . sodium chloride   Intravenous Once  . sodium chloride   Intravenous Once  . vitamin C  500 mg Per Tube Daily  . chlorhexidine gluconate (MEDLINE  KIT)  15 mL Mouth Rinse BID  . Chlorhexidine Gluconate Cloth  6 each Topical Daily  . clonazePAM  1 mg Per Tube Q6H  . docusate  100 mg Per Tube BID  . etomidate  40 mg Intravenous Once  . feeding supplement (PROSource TF)  45 mL Per Tube BID  . fentaNYL (SUBLIMAZE) injection  200 mcg Intravenous Once  . fentaNYL (SUBLIMAZE) injection  50 mcg Intravenous Once  . free water  400 mL Per Tube Q4H  . linagliptin  5 mg Per Tube Daily  . mouth rinse  15 mL Mouth Rinse 10 times per day  . midazolam  5 mg Intravenous Once  . nutrition supplement (JUVEN)  1 packet Per Tube BID BM  . oxyCODONE  10 mg Per Tube Q6H  . pantoprazole sodium  40 mg Per Tube Daily  . polyethylene glycol  17 g Per Tube Daily  . QUEtiapine  100 mg Per Tube BID  . sennosides  5 mL Per Tube BID  .  sodium chloride flush  10-40 mL Intracatheter Q12H  . vecuronium  10 mg Intravenous Once  . zinc sulfate  220 mg Per Tube Daily    Infusions: . sodium chloride 250 mL (01/21/20 1227)  . albumin human 12.5 g (01/28/20 1229)  . albumin human    . amiodarone 30 mg/hr (01/29/20 0700)  . ampicillin (OMNIPEN) IV Stopped (01/29/20 0600)  . cefTRIAXone (ROCEPHIN)  IV Stopped (01/28/20 2323)  . dexmedetomidine (PRECEDEX) IV infusion 0.1 mcg/kg/hr (01/29/20 0700)  . feeding supplement (PIVOT 1.5 CAL) 1,000 mL (01/29/20 0335)  . fentaNYL infusion INTRAVENOUS Stopped (01/27/20 0057)  . insulin 5 mL/hr at 01/29/20 0700  . labetalol (NORMODYNE) infusion 5 mg/mL Stopped (01/24/20 0505)  . milrinone 0.125 mcg/kg/min (01/29/20 0700)  . norepinephrine (LEVOPHED) Adult infusion 5 mcg/min (01/29/20 0700)  . prismasol BGK 2/2.5 dialysis solution 2,000 mL/hr at 01/29/20 0540  . prismasol BGK 2/2.5 replacement solution 500 mL/hr at 01/28/20 1710  . prismasol BGK 2/2.5 replacement solution 300 mL/hr at 01/28/20 1710  . vasopressin 0.04 Units/min (01/29/20 0700)    PRN Medications: sodium chloride, acetaminophen (TYLENOL) oral liquid 160  mg/5 mL, albumin human, albuterol, dextrose, fentaNYL, fentaNYL, guaiFENesin-dextromethorphan, heparin, hydrALAZINE, hydrOXYzine, ondansetron **OR** ondansetron (ZOFRAN) IV, sodium chloride, sodium chloride flush   Assessment/Plan:   1. Acute hypoxic/hypercapneic respiratory failure in setting of severe COVID PNA/ARDS -> VV ECMO - admit 8/2 - intubation 8/3 - has received actmera (compelted 8/2), remdesivir (completed 8/6) and steroids - failed full vent support with proning/paralytic - Cannulated for VV ECMO on 8/9 - Extubated 8/16 - CXR unchanged  With diffuse infiltrates Personally reviewed - Off bival due to [place] - Circuit ok but LDH rising and PLTs and Fibrinogen remain low. Need to consider low-dose AC versus change of circuit although change of circuit would be high risk - On VP & NE for BP support as needed - CVVHD starter 8/22 for volume removal and uremia   2. Enterococcus sepsis - continue vanc + high dose zosyn switch to amp/ceftriaxone on 8/22  3. Intracranial hemorrhage - ? Septic emboli - repeat head CT on 8/14 with stable bleeds but increased edema - neurology following. Suspect significant long-term injury sustained. Will follow commands. Appears to have dense LUE weakness and possibly LLE - repeat head CT stable 8/16 - No AC with ICH for now. See discussion about circuit above - Consider TEE at some point to further evalaute. Too unstable now  4. Thrombocytopenia - PLTs < 40K - initially thought due to sepsis but not improving. ? Circuit related - oozing from HD site - would not transfuse PLTs unless absolutely necessary given risk of clotting pump  4. Morbid obesity - Body mass index is 49.38 kg/m.  5. Poorly controlled DM2 - HgBA1c 10.7 - CBGs remain elevated - On IV insulin adjust as needed  6. Hypernatremia - Na 146.Improving with HD. Stop free water  7. Lactic acidosis -  improving  8. PAF - 2 episodes of AF with RVR on 8/21 - back in NSR on  IV amio this am. Continue   9. AKI/azotemia - CVVHD started on 8/22 - Discussed with Renal this am. Billey Gosling continue    CRITICAL CARE Performed by: Glori Bickers  Total critical care time: 40 minutes  Critical care time was exclusive of separately billable procedures and treating other patients.  Critical care was necessary to treat or prevent imminent or life-threatening deterioration.  Critical care was time spent personally by me (independent of midlevel  providers or residents) on the following activities: development of treatment plan with patient and/or surrogate as well as nursing, discussions with consultants, evaluation of patient's response to treatment, examination of patient, obtaining history from patient or surrogate, ordering and performing treatments and interventions, ordering and review of laboratory studies, ordering and review of radiographic studies, pulse oximetry and re-evaluation of patient's condition.   Length of Stay: 21   Glori Bickers  MD 01/29/2020, 7:33 AM  Advanced Heart Failure Team Pager [phone] (M-F; 7a - 4p)  Please contact [facility] for night-coverage after hours (4p -7a ) and weekends on [email or web address]

## 2020-01-29 NOTE — Consult Note (Signed)
[place] [phone]   Fax:([phone]) [place] NOTE  Patient Care Team: Shelda Pal, DO as PCP - General (Family Medicine) Saguier, Iris Pert as Physician Assistant (Physician Assistant)  CHIEF COMPLAINTS/PURPOSE OF CONSULTATION:  "Thrombocytopenia in Setting of Critical Illness "  HISTORY OF PRESENTING ILLNESS:  Alisha Stephenson 54 y.o. female with medical history significant for DM type II, IBS, NAFLD, and asthma who is currently admitted to the CCU on ECMO for COVID infection.   On review of the previous records Alisha Stephenson has had a prolonged admission starting on 01/09/2020.  At the time of presentation she required high flow oxygen at 2 L to maintain stats.  Chest x-ray was concerning for pneumonia and she had a temperature of 99.1.  She was found to have a platelet count of 99, CRP of 13.2, lactic acid of 2.6, and a D-dimer of 1.32.  Patient was started on steroids and remdesivir for acute hypoxic respiratory failure second to COVID-19 infection.  She was not vaccinated.  On 01/28/2020 patient was cannulated for VV ECMO.  On 813 the patient developed several areas of intracranial hemorrhage and her anticoagulation with bivalirudin was stopped.  The patient had never been on heparin during the entirety of this hospitalization.  On 8/16 the patient was extubated.  Repeat head CT on the same day showed that her brain bleed was stable.  Her platelets have trended down significantly from 99 at presentation, with a maximum of 168 on 01/10/2020, subsequent decline to the 90s on 01/17/2020 and most recently lower than 30 since 8/13.  She has remained approximately 30 since that time.  Due to concern for the longstanding thrombocytopenia hematology was consulted for further evaluation management.  On exam today the patient's husband is at bedside.  She is not responsive at this time, though the nurse reports she does periodically wake up and respond  to commands.  She is currently on pressors and active ECMO.  She has duskiness in her fingers for which she has been evaluated by vascular surgery.  She has some active oozing from her line site on her left shoulder, and modest bleeding from the site of the ECMO cannulation.  No ROS was performed given the patient status.  MEDICAL HISTORY:  Past Medical History:  Diagnosis Date  . Asthma   . Diabetes mellitus without complication ([place])   . Diverticulitis   . Gallstones   . IBS (irritable bowel syndrome)   . NAFLD (nonalcoholic fatty liver disease)    CT scan 2015  . Ovarian cyst     SURGICAL HISTORY: Past Surgical History:  Procedure Laterality Date  . ABDOMINAL HYSTERECTOMY    . CESAREAN SECTION    . CHOLECYSTECTOMY    . COLONOSCOPY     about 2011  . ECMO CANNULATION N/A 01/29/2020   Procedure: ECMO CANNULATION;  Surgeon: Jolaine Artist, MD;  Location: [facility];  Service: Cardiovascular;  Laterality: N/A;    SOCIAL HISTORY: Social History   Socioeconomic History  . Marital status: Married    Spouse name: Not on file  . Number of children: 2  . Years of education: Not on file  . Highest education level: Not on file  Occupational History  . Occupation: Therapist, art   Tobacco Use  . Smoking status: Former Research scientist (life sciences)  . Smokeless tobacco: Never Used  . Tobacco comment: quit over 10 years ago  Vaping Use  . Vaping Use: Never used  Substance and Sexual Activity  . Alcohol use: No    Comment: rarely  . Drug use: No  . Sexual activity: Never  Other Topics Concern  . Not on file  Social History Narrative  . Not on file   Social Determinants of Health   Financial Resource Strain:   . Difficulty of Paying Living Expenses: Not on file  Food Insecurity:   . Worried About Charity fundraiser in the Last Year: Not on file  . Ran Out of Food in the Last Year: Not on file  Transportation Needs:   . Lack of Transportation (Medical): Not on file  . Lack of  Transportation (Non-Medical): Not on file  Physical Activity:   . Days of Exercise per Week: Not on file  . Minutes of Exercise per Session: Not on file  Stress:   . Feeling of Stress : Not on file  Social Connections:   . Frequency of Communication with Friends and Family: Not on file  . Frequency of Social Gatherings with Friends and Family: Not on file  . Attends Religious Services: Not on file  . Active Member of Clubs or Organizations: Not on file  . Attends Archivist Meetings: Not on file  . Marital Status: Not on file  Intimate Partner Violence:   . Fear of Current or Ex-Partner: Not on file  . Emotionally Abused: Not on file  . Physically Abused: Not on file  . Sexually Abused: Not on file    FAMILY HISTORY: Family History  Problem Relation Age of Onset  . Diabetes Father   . Heart disease Father   . Breast cancer Maternal Grandmother   . Breast cancer Maternal Aunt   . Colon cancer Neg Hx   . Esophageal cancer Neg Hx     ALLERGIES:  is allergic to sulfa antibiotics.  MEDICATIONS:  Current Facility-Administered Medications  Medication Dose Route Frequency Provider Last Rate Last Admin  . 0.9 %  sodium chloride infusion (Manually program via Guardrails IV Fluids)   Intravenous Once Noemi Chapel P, DO      . 0.9 %  sodium chloride infusion (Manually program via Guardrails IV Fluids)   Intravenous Once Bensimhon, Shaune Pascal, MD      . 0.9 %  sodium chloride infusion   Intravenous PRN Bensimhon, Shaune Pascal, MD 10 mL/hr at 01/29/20 1444 250 mL at 01/29/20 1444  . acetaminophen (TYLENOL) 160 MG/5ML solution 650 mg  650 mg Per Tube Q6H PRN Julian Hy, DO   650 mg at 01/25/20 2007  . albumin human 5 % solution 12.5 g  12.5 g Intravenous Q15 min PRN Bensimhon, Shaune Pascal, MD 60 mL/hr at 01/28/20 1229 12.5 g at 01/28/20 1229  . albumin human 5 % solution 12.5 g  12.5 g Intravenous Once Candee Furbish, MD      . albuterol (PROVENTIL) (2.5 MG/3ML) 0.083% nebulizer  solution 2.5 mg  2.5 mg Nebulization Q4H PRN Bensimhon, Shaune Pascal, MD      . amiodarone (NEXTERONE PREMIX) 360-4.14 MG/200ML-% (1.8 mg/mL) IV infusion  30 mg/hr Intravenous Continuous Kipp Brood, MD 16.67 mL/hr at 01/29/20 2103 30 mg/hr at 01/29/20 2103  . ascorbic acid (VITAMIN C) tablet 500 mg  500 mg Per Tube Daily Noemi Chapel P, DO   500 mg at 01/29/20 3335  . chlorhexidine gluconate (MEDLINE KIT) (PERIDEX) 0.12 % solution 15 mL  15 mL Mouth Rinse BID Bensimhon, Shaune Pascal, MD   15 mL at 01/29/20  2027  . Chlorhexidine Gluconate Cloth 2 % PADS 6 each  6 each Topical Daily Bensimhon, Shaune Pascal, MD   6 each at 01/29/20 0300  . clonazePAM (KLONOPIN) disintegrating tablet 1 mg  1 mg Per Tube Q6H Kipp Brood, MD   1 mg at 01/29/20 1720  . dexmedetomidine (PRECEDEX) 400 MCG/100ML (4 mcg/mL) infusion  0.4-1.2 mcg/kg/hr Intravenous Titrated Rigoberto Noel, MD 23.7 mL/hr at 01/29/20 2102 0.7 mcg/kg/hr at 01/29/20 2102  . dextrose 50 % solution 0-50 mL  0-50 mL Intravenous PRN Noemi Chapel P, DO      . docusate (COLACE) 50 MG/5ML liquid 100 mg  100 mg Per Tube BID Rigoberto Noel, MD   100 mg at 01/29/20 3154  . feeding supplement (PIVOT 1.5 CAL) liquid 1,000 mL  1,000 mL Per Tube Continuous Audria Nine, DO 75 mL/hr at 01/29/20 0335 1,000 mL at 01/29/20 0335  . feeding supplement (PROSource TF) liquid 45 mL  45 mL Per Tube BID Audria Nine, DO   45 mL at 01/29/20 0086  . fentaNYL (SUBLIMAZE) 5000 mcg / 100 mL (50 mcg/mL) infusion  0-400 mcg/hr Intravenous Continuous Bensimhon, Shaune Pascal, MD   Stopped at 01/27/20 0057  . fentaNYL (SUBLIMAZE) bolus via infusion 50 mcg  50 mcg Intravenous Q15 min PRN Bensimhon, Shaune Pascal, MD   50 mcg at 01/14/20 1455  . fentaNYL (SUBLIMAZE) bolus via infusion 50 mcg  50 mcg Intravenous Q15 min PRN Bensimhon, Shaune Pascal, MD   50 mcg at 01/26/20 0515  . fentaNYL (SUBLIMAZE) injection 200 mcg  200 mcg Intravenous Once Candee Furbish, MD      . fentaNYL (SUBLIMAZE)  injection 50 mcg  50 mcg Intravenous Once Bensimhon, Shaune Pascal, MD      . guaiFENesin-dextromethorphan (ROBITUSSIN DM) 100-10 MG/5ML syrup 10 mL  10 mL Per Tube Q4H PRN Noemi Chapel P, DO      . heparin injection 1,000-6,000 Units  1,000-6,000 Units CRRT PRN Reesa Chew, MD   2,400 Units at 01/28/20 1456  . hydrALAZINE (APRESOLINE) injection 10 mg  10 mg Intravenous Q1H PRN Bensimhon, Shaune Pascal, MD   10 mg at 01/22/20 1536  . hydrOXYzine (ATARAX/VISTARIL) tablet 25 mg  25 mg Per Tube TID PRN Noemi Chapel P, DO   25 mg at 01/24/20 0509  . insulin regular, human (MYXREDLIN) 100 units/ 100 mL infusion   Intravenous Continuous Julian Hy, DO 14 mL/hr at 01/29/20 2100 Rate Verify at 01/29/20 2100  . linagliptin (TRADJENTA) tablet 5 mg  5 mg Per Tube Daily Noemi Chapel P, DO   5 mg at 01/29/20 1415  . MEDLINE mouth rinse  15 mL Mouth Rinse 10 times per day Bensimhon, Shaune Pascal, MD   15 mL at 01/29/20 1800  . midazolam (VERSED) injection 5 mg  5 mg Intravenous Once Candee Furbish, MD      . milrinone (PRIMACOR) 20 MG/100 ML (0.2 mg/mL) infusion  0.125 mcg/kg/min Intravenous Continuous Kipp Brood, MD 5.51 mL/hr at 01/29/20 2100 0.125 mcg/kg/min at 01/29/20 2100  . norepinephrine (LEVOPHED) 16 mg in 243m premix infusion  0-40 mcg/min Intravenous Titrated Bensimhon, DShaune Pascal MD 5.63 mL/hr at 01/29/20 2100 6 mcg/min at 01/29/20 2100  . nutrition supplement (JUVEN) (JUVEN) powder packet 1 packet  1 packet Per Tube BID BM MAudria Nine DO   1 packet at 01/29/20 1415  . ondansetron (ZOFRAN) tablet 4 mg  4 mg Oral Q6H PRN Bensimhon, DShaune Pascal MD  Or  . ondansetron (ZOFRAN) injection 4 mg  4 mg Intravenous Q6H PRN Bensimhon, Shaune Pascal, MD   4 mg at 01/09/20 0224  . oxyCODONE (ROXICODONE) 5 MG/5ML solution 10 mg  10 mg Per Tube Q6H Candee Furbish, MD   10 mg at 01/29/20 1501  . piperacillin-tazobactam (ZOSYN) IVPB 4.5 g  4.5 g Intravenous Q6H Carlyle Basques, MD   Stopped at 01/29/20 1900  .  polyethylene glycol (MIRALAX / GLYCOLAX) packet 17 g  17 g Per Tube Daily Bensimhon, Shaune Pascal, MD   17 g at 01/29/20 2878  . prismasol BGK 2/2.5 dialysis solution   CRRT Continuous Reesa Chew, MD 2,000 mL/hr at 01/29/20 1756 New Bag at 01/29/20 1756  . prismasol BGK 2/2.5 replacement solution   CRRT Continuous Reesa Chew, MD 500 mL/hr at 01/29/20 2024 New Bag at 01/29/20 2024  . prismasol BGK 2/2.5 replacement solution   CRRT Continuous Reesa Chew, MD 300 mL/hr at 01/29/20 1448 New Bag at 01/29/20 1448  . sennosides (SENOKOT) 8.8 MG/5ML syrup 5 mL  5 mL Per Tube BID Rigoberto Noel, MD   5 mL at 01/29/20 0832  . sodium chloride 0.9 % primer fluid for CRRT  500 mL CRRT PRN Reesa Chew, MD      . vasopressin (PITRESSIN) 20 Units in sodium chloride 0.9 % 100 mL infusion-*FOR SHOCK*  0-0.04 Units/min Intravenous Continuous Kipp Brood, MD 12 mL/hr at 01/29/20 2100 0.04 Units/min at 01/29/20 2100  . vecuronium (NORCURON) injection 10 mg  10 mg Intravenous Once Candee Furbish, MD      . zinc sulfate capsule 220 mg  220 mg Per Tube Daily Noemi Chapel P, DO   220 mg at 01/29/20 6767    REVIEW OF SYSTEMS:   Unable to perform  PHYSICAL EXAMINATION: ECOG PERFORMANCE STATUS: 4 - Bedbound  Vitals:   01/29/20 2100 01/29/20 2115  BP: (!) 139/45   Pulse: 83 83  Resp: 20 (!) 28  Temp: (!) 96.6 F (35.9 C) (!) 96.6 F (35.9 C)  SpO2: (!) 82% (!) 83%   Filed Weights   01/27/20 0437 01/28/20 0500 01/29/20 0341  Weight: (!) 333 lb 1.8 oz (151.1 kg) (!) 335 lb 1.6 oz (152 kg) (!) 345 lb 0.3 oz (156.5 kg)    GENERAL: critically ill middle aged Caucasian female on ECMO. Unresponsive on exam.  SKIN: dusky appearance of fingers on right hand.  EYES: conjunctiva are pink and non-injected, sclera clear OROPHARYNX: deferred  NECK: oozing from line sight in left neck. Minimal bleeding from ECMO site.  LYMPH:  no palpable lymphadenopathy in the cervical, axillary or  inguinal LUNGS: minimal respirations HEART: regular rate & rhythm and no murmurs and no lower extremity edema Musculoskeletal: no lower extremity edema  PSYCH: unable to assess NEURO: unable to assess  LABORATORY DATA:  I have reviewed the data as listed CBC Latest Ref Rng & Units 01/29/2020 01/29/2020 01/29/2020  WBC 4.0 - 10.5 K/uL - - 11.9(H)  Hemoglobin 12.0 - 15.0 g/dL 8.5(L) 8.8(L) 8.4(L)  Hematocrit 36 - 46 % 25.0(L) 26.0(L) 28.0(L)  Platelets 150 - 400 K/uL - - PLATELET CLUMPS NOTED ON SMEAR, UNABLE TO ESTIMATE    CMP Latest Ref Rng & Units 01/29/2020 01/29/2020 01/29/2020  Glucose 70 - 99 mg/dL - - 235(H)  BUN 6 - 20 mg/dL - - 113(H)  Creatinine 0.44 - 1.00 mg/dL - - 1.26(H)  Sodium 135 - 145 mmol/L 139 140 140  Potassium 3.5 - 5.1 mmol/L 4.4 4.1 4.3  Chloride 98 - 111 mmol/L - - 102  CO2 22 - 32 mmol/L - - 25  Calcium 8.9 - 10.3 mg/dL - - 8.2(L)  Total Protein 6.5 - 8.1 g/dL - - -  Total Bilirubin 0.3 - 1.2 mg/dL - - -  Alkaline Phos 38 - 126 U/L - - -  AST 15 - 41 U/L - - -  ALT 0 - 44 U/L - - -    BLOOD FILM: To be reviewed.   RADIOGRAPHIC STUDIES: DG Chest 1 View  Result Date: 01/28/2020 CLINICAL DATA:  COVID-19 positive, ECMO, central line thrombosis EXAM: CHEST  1 VIEW COMPARISON:  Chest radiograph from earlier today. FINDINGS: Stable right internal jugular ECMO catheter entering the IVC. Left subclavian central venous catheter terminates over the high left mediastinum, unchanged. Left internal jugular central venous catheter is stable with tip in the region of middle third of the SVC. Right PICC enters the SVC with the tip not well visualized. Enteric tube enters the stomach with the tip not seen on this image. No pneumothorax. Complete opacification of the hemithoraces bilaterally, unchanged. IMPRESSION: 1. Stable support structures. No pneumothorax. Left subclavian central venous catheter tip overlies the high left mediastinum, unchanged, indeterminate in location as  described on prior chest radiograph, potentially at the junction of the left internal jugular and left subclavian veins. 2. Stable complete opacification of the hemithoraces bilaterally. Electronically Signed   By: Ilona Sorrel M.D.   On: 01/28/2020 16:25   DG Chest 1 View  Result Date: 01/28/2020 CLINICAL DATA:  Status post line placement today. Shortness of breath. COVID-19 pneumonia. EXAM: CHEST  1 VIEW COMPARISON:  Single-view of the chest 01/28/2020. FINDINGS: New left subclavian central venous catheter tip projects medial to the left clavicular head. The catheter crosses the left IJ catheter and its position is unclear. It may be indenting medial wall of the junction of the left internal jugular and subclavian veins. ECMO cannula, feeding tube and left IJ catheter are unchanged. There is complete whiteout of the chest bilaterally. Cardiac silhouette is not visible. IMPRESSION: Position of the tip of the patient's left subclavian catheter tip is indeterminate. It may be indenting the medial wall of the junction of the left subclavian and left internal jugular veins. No change in complete whiteout of the chest. Critical Value/emergent results were called by telephone at the time of interpretation on 01/28/2020 at 3:29 pm to provider [facility] , who verbally acknowledged these results. Electronically Signed   By: Inge Rise M.D.   On: 01/28/2020 15:33   DG Chest 1 View  Result Date: 01/13/2020 CLINICAL DATA:  Endotracheal tube position EXAM: CHEST  1 VIEW COMPARISON:  01/13/2020 FINDINGS: Endotracheal tube tip is at the level of the clavicular heads, unchanged. There is improved lucency of the lungs following patient rotation. IMPRESSION: Endotracheal tube tip at the level of the clavicular heads. Increased lucency of the lungs following patient rotation. Electronically Signed   By: Ulyses Jarred M.D.   On: 01/13/2020 06:09   DG Abd 1 View  Result Date: 01/31/2020 CLINICAL DATA:  54 year old  female enteric tube placement. EXAM: ABDOMEN - 1 VIEW COMPARISON:  01/13/2020 and earlier. FINDINGS: Portable AP prone view at 0031 hours. Enteric feeding tube tip is at the level of the distal gastric body, projecting in the midline. Paucity of bowel gas. Stable cholecystectomy clips. No acute osseous abnormality identified. IMPRESSION: Enteric feeding tube tip at the level  of the distal gastric body. Paucity of bowel gas. Electronically Signed   By: Genevie Ann M.D.   On: 01/07/2020 02:31   DG Abd 1 View  Result Date: 01/13/2020 CLINICAL DATA:  Nasogastric tube placement EXAM: ABDOMEN - 1 VIEW COMPARISON:  01/10/2020 FINDINGS: Tip of the weighted enteric tube projects within the left mid abdomen, likely in the stomach. IMPRESSION: Enteric tube tip in the stomach. Electronically Signed   By: Ulyses Jarred M.D.   On: 01/13/2020 06:07   DG Abd 1 View  Result Date: 01/10/2020 CLINICAL DATA:  Feeding tube placement. EXAM: ABDOMEN - 1 VIEW COMPARISON:  January 09, 2020. FINDINGS: The bowel gas pattern is normal. Distal tip of feeding tube is seen in expected position of distal stomach. No radio-opaque calculi or other significant radiographic abnormality are seen. IMPRESSION: Distal tip of feeding tube seen in expected position of distal stomach. Electronically Signed   By: Marijo Conception M.D.   On: 01/10/2020 14:38   DG Abd 1 View  Result Date: 01/09/2020 CLINICAL DATA:  Endogastric tube placement. EXAM: ABDOMEN - 1 VIEW COMPARISON:  None. FINDINGS: A nasogastric tube is seen with its distal tip overlying the expected region of the duodenal bulb. The bowel gas pattern is normal. No radio-opaque calculi or other significant radiographic abnormality are seen. Radiopaque surgical clips are seen overlying the right upper quadrant. IMPRESSION: Nasogastric tube positioning as described above. Electronically Signed   By: Virgina Norfolk M.D.   On: 01/09/2020 17:07   CT Head Wo Contrast  Result Date:  01/22/2020 CLINICAL DATA:  Intracranial hemorrhage, follow-up EXAM: CT HEAD WITHOUT CONTRAST TECHNIQUE: Contiguous axial images were obtained from the base of the skull through the vertex without intravenous contrast. COMPARISON:  01/20/2020 FINDINGS: Brain: Multifocal parenchymal hemorrhage is not substantially changed. The largest area hemorrhage in the right parietal region is similar in size. Associated edema and regional mass effect are also similar. There is no new loss of gray-white differentiation. Ventricles are stable in size. No evidence of intraventricular extension of hemorrhage. Vascular: No new findings. Skull: Calvarium is unremarkable. Sinuses/Orbits: Nonspecific paranasal sinus mucosal thickening and opacification. No new orbital finding. Other: Nonspecific mastoid and middle ear effusions. IMPRESSION: Continued stability with no substantial change in multifocal parenchymal hemorrhages and associated mild regional mass effect. No new hemorrhage. Electronically Signed   By: Macy Mis M.D.   On: 01/22/2020 14:26   CT HEAD WO CONTRAST  Result Date: 01/20/2020 CLINICAL DATA:  Intracranial hemorrhage, follow-up EXAM: CT HEAD WITHOUT CONTRAST TECHNIQUE: Contiguous axial images were obtained from the base of the skull through the vertex without intravenous contrast. COMPARISON:  01/19/2020 FINDINGS: Brain: Multifocal parenchymal hemorrhage is again identified. As noted previously, greatest involvement in the biparietal region with the largest area of hemorrhage on the right measuring up to 3.7 cm as before. There is mild edema associated with the areas of hemorrhage without substantial mass effect. No new hemorrhage is identified. Gray-white differentiation remains preserved. Ventricles stable in size. Vascular: No new finding. Skull: Calvarium is unremarkable. Sinuses/Orbits: No acute finding. Other: Partially imaged facial subcutaneous edema. IMPRESSION: No substantial change in multifocal  parenchymal hemorrhages. Regional mass effect remains mild. No new hemorrhage. Electronically Signed   By: Macy Mis M.D.   On: 01/20/2020 11:30   CT HEAD WO CONTRAST  Result Date: 01/19/2020 CLINICAL DATA:  Mental status change, COVID positive, on ECMO EXAM: CT HEAD WITHOUT CONTRAST TECHNIQUE: Contiguous axial images were obtained from the base of the  skull through the vertex without intravenous contrast. COMPARISON:  01/06/2020 FINDINGS: Brain: Multifocal areas of parenchymal hemorrhage identified bilaterally with greatest involvement of the parietal lobes. The largest area the right parietal lobe measures approximately 3.8 x 2.3 cm. There is edema associated with these hemorrhages causing mild regional mass effect. No intraventricular extension. No hydrocephalus. Gray-white differentiation is preserved. Vascular: No hyperdense vessel or unexpected calcification. Skull: Calvarium is unremarkable. Sinuses/Orbits: Nonspecific extensive paranasal sinus opacification. Other: Nonspecific mastoid and middle ear opacification. Partially imaged endotracheal and enteric tubes. IMPRESSION: Multifocal parenchymal hemorrhages, with the largest within the right parietal lobe. Together with associated edema, there is mild regional mass effect but no herniation. Considerations include sequelae of ECMO anticoagulation, hemorrhagic conversion of embolic infarcts, and vasculitis/vasculopathy. These results were called by telephone at the time of interpretation on 01/19/2020 at 2:41 pm to provider Noemi Chapel , who verbally acknowledged these results. Electronically Signed   By: Macy Mis M.D.   On: 01/19/2020 14:45   CT Head Wo Contrast  Result Date: 01/06/2020 CLINICAL DATA:  Headache EXAM: CT HEAD WITHOUT CONTRAST TECHNIQUE: Contiguous axial images were obtained from the base of the skull through the vertex without intravenous contrast. COMPARISON:  None. FINDINGS: Brain: No evidence of acute infarction,  hemorrhage, hydrocephalus, extra-axial collection or mass lesion/mass effect. Vascular: No hyperdense vessel or unexpected calcification. Skull: Normal. Negative for fracture or focal lesion. Sinuses/Orbits: Normal globes and orbits. Small area polypoid mucosal thickening in the anterior inferior right maxillary sinus. Sinuses otherwise clear. Other: None. IMPRESSION: No intracranial abnormalities. Electronically Signed   By: Lajean Manes M.D.   On: 01/06/2020 09:45   MR ANGIO HEAD WO CONTRAST  Result Date: 01/06/2020 CLINICAL DATA:  Subarachnoid hemorrhage suspected. Additional provided: Subarachnoid hemorrhage suspected, COVID positive with headaches for 9 days. EXAM: MRI HEAD WITHOUT CONTRAST MRA HEAD WITHOUT CONTRAST TECHNIQUE: Multiplanar, multiecho pulse sequences of the brain and surrounding structures were obtained without intravenous contrast. Angiographic images of the head were obtained using MRA technique without contrast. COMPARISON:  Head CT 01/06/2020 FINDINGS: MRI HEAD FINDINGS Brain: Cerebral volume is normal. Minimal scattered T2/FLAIR hyperintensity within the cerebral white matter is nonspecific, but consistent with chronic small vessel ischemic disease. There is no acute infarct. No evidence of intracranial mass. No chronic intracranial blood products. No extra-axial fluid collection. No midline shift. Vascular: Reported below. Skull and upper cervical spine: No focal marrow lesion. Sinuses/Orbits: Visualized orbits show no acute finding. Mild ethmoid sinus mucosal thickening. Small right maxillary sinus mucous retention cyst. No significant mastoid effusion MRA HEAD FINDINGS The intracranial internal carotid arteries are patent. The M1 middle cerebral arteries are patent without significant stenosis. No M2 proximal branch occlusion or high-grade proximal stenosis is identified. 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 The A1 left ACA and more distal anterior cerebral  arteries are patent. There is a slightly bulbous appearance of the anterior communicating artery measuring approximately 1 mm (series 103, image 6). This may reflect a tiny aneurysm. The non dominant intracranial left vertebral artery is developmentally diminutive, but patent. The dominant cranial right vertebral artery is patent without significant stenosis. The basilar artery is developmentally diminutive, but patent without significant stenosis. The posterior cerebral arteries are patent proximally without significant stenosis. The posterior cerebral arteries are predominantly fetal in origin bilaterally. IMPRESSION: MRI brain: 1. No evidence of acute intracranial abnormality. 2. Mild chronic small vessel ischemic changes within the cerebral white matter MRA head: 1. No intracranial large vessel occlusion or proximal high-grade arterial stenosis.  2. 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 3. There is a slightly bulbous appearance of the anterior communicating artery measuring 1 mm in diameter. This may reflect a tiny aneurysm. 4. The intracranial vertebral and basilar arteries are developmentally diminutive in the setting of predominantly fetal origin posterior cerebral arteries. Electronically Signed   By: Kellie Simmering DO   On: 01/06/2020 19:14   MR Brain Wo Contrast (neuro protocol)  Result Date: 01/06/2020 CLINICAL DATA:  Subarachnoid hemorrhage suspected. Additional provided: Subarachnoid hemorrhage suspected, COVID positive with headaches for 9 days. EXAM: MRI HEAD WITHOUT CONTRAST MRA HEAD WITHOUT CONTRAST TECHNIQUE: Multiplanar, multiecho pulse sequences of the brain and surrounding structures were obtained without intravenous contrast. Angiographic images of the head were obtained using MRA technique without contrast. COMPARISON:  Head CT 01/06/2020 FINDINGS: MRI HEAD FINDINGS Brain: Cerebral volume is normal. Minimal scattered T2/FLAIR hyperintensity within the cerebral white matter  is nonspecific, but consistent with chronic small vessel ischemic disease. There is no acute infarct. No evidence of intracranial mass. No chronic intracranial blood products. No extra-axial fluid collection. No midline shift. Vascular: Reported below. Skull and upper cervical spine: No focal marrow lesion. Sinuses/Orbits: Visualized orbits show no acute finding. Mild ethmoid sinus mucosal thickening. Small right maxillary sinus mucous retention cyst. No significant mastoid effusion MRA HEAD FINDINGS The intracranial internal carotid arteries are patent. The M1 middle cerebral arteries are patent without significant stenosis. No M2 proximal branch occlusion or high-grade proximal stenosis is identified. 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 The A1 left ACA and more distal anterior cerebral arteries are patent. There is a slightly bulbous appearance of the anterior communicating artery measuring approximately 1 mm (series 103, image 6). This may reflect a tiny aneurysm. The non dominant intracranial left vertebral artery is developmentally diminutive, but patent. The dominant cranial right vertebral artery is patent without significant stenosis. The basilar artery is developmentally diminutive, but patent without significant stenosis. The posterior cerebral arteries are patent proximally without significant stenosis. The posterior cerebral arteries are predominantly fetal in origin bilaterally. IMPRESSION: MRI brain: 1. No evidence of acute intracranial abnormality. 2. Mild chronic small vessel ischemic changes within the cerebral white matter MRA head: 1. No intracranial large vessel occlusion or proximal high-grade arterial stenosis. 2. 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 3. There is a slightly bulbous appearance of the anterior communicating artery measuring 1 mm in diameter. This may reflect a tiny aneurysm. 4. The intracranial vertebral and basilar arteries are  developmentally diminutive in the setting of predominantly fetal origin posterior cerebral arteries. Electronically Signed   By: Kellie Simmering DO   On: 01/06/2020 19:14   CARDIAC CATHETERIZATION  Result Date: 01/16/2020 See surgical note for result.  CT CHEST ABDOMEN PELVIS W CONTRAST  Result Date: 01/19/2020 CLINICAL DATA:  Sepsis. Leukocytosis. Respiratory failure. COVID ARDS. EXAM: CT CHEST, ABDOMEN, AND PELVIS WITH CONTRAST TECHNIQUE: Multidetector CT imaging of the chest, abdomen and pelvis was performed following the standard protocol during bolus administration of intravenous contrast. CONTRAST:  129m OMNIPAQUE IOHEXOL 350 MG/ML SOLN COMPARISON:  Abdomen and pelvis CT dated 05/10/2014, portable chest obtained earlier today. FINDINGS: CT CHEST FINDINGS Cardiovascular: The right jugular ECMO catheter extends through the superior vena cava and into the inferior vena cava with its tip above the level of the renal veins. Mildly enlarged heart. Minimal pericardial fluid with a maximum thickness of 5 mm. Mediastinum/Nodes: Endotracheal tube tip 1 cm above the carina. Feeding tube extending into the stomach. Unremarkable included thyroid  gland. No enlarged lymph nodes. Lungs/Pleura: Dense airspace opacity throughout both lungs with air bronchograms. Small to moderate-sized right pleural effusion and small left pleural effusion. Musculoskeletal: Thoracic spine degenerative changes. Lower cervical spine fixation hardware. CT ABDOMEN PELVIS FINDINGS Hepatobiliary: No focal liver abnormality is seen. Status post cholecystectomy. No biliary dilatation. Pancreas: Unremarkable. No pancreatic ductal dilatation or surrounding inflammatory changes. Spleen: Normal in size without focal abnormality. Adrenals/Urinary Tract: Foley catheter in the urinary bladder with no urine in the bladder. There is associated air in the bladder. Normal appearing adrenal glands, kidneys and ureters. Stomach/Bowel: Large number of sigmoid  and distal descending colon diverticula without evidence of diverticulitis. Normal appearing appendix, small bowel and stomach. Feeding tube tip in the mid to distal stomach. Rectal balloon catheter. Vascular/Lymphatic: No significant vascular findings are present. No enlarged abdominal or pelvic lymph nodes. Reproductive: Status post hysterectomy. No adnexal masses. Other: Mild bilateral subcutaneous edema. Small amount of free peritoneal fluid. Musculoskeletal: Lumbar spine degenerative changes. IMPRESSION: 1. Dense airspace opacity throughout both lungs with air bronchograms, compatible with the clinical diagnosis of COVID ARDS. Dense bilateral pneumonia could also have this appearance. 2. Small to moderate-sized right pleural effusion and small left pleural effusion. 3. Small amount of ascites. 4. Colonic diverticulosis. Electronically Signed   By: Claudie Revering M.D.   On: 01/19/2020 15:23   DG CHEST PORT 1 VIEW  Result Date: 01/29/2020 CLINICAL DATA:  COVID 19 virus infection. Acute respiratory failure. On ECMO. EXAM: PORTABLE CHEST 1 VIEW COMPARISON:  01/28/2020 FINDINGS: Left subclavian central venous catheter tip is again seen overlying the distal left subclavian vein. Left jugular central venous catheter tip remains in the proximal SVC. Feeding tube remains in place as well as ECMO catheter. Complete opacification of bilateral hemi-thoraces again seen, without significant change. IMPRESSION: Complete opacification of bilateral hemi-thoraces, without significant change. Stable support lines and tubes. Electronically Signed   By: Marlaine Hind M.D.   On: 01/29/2020 07:41   DG CHEST PORT 1 VIEW  Result Date: 01/28/2020 CLINICAL DATA:  Severe acute respiratory failure.  COVID-19. EXAM: PORTABLE CHEST 1 VIEW COMPARISON:  None. FINDINGS: An ECMO cannula is again identified. The feeding tube terminates below today's film. The left IJ is stable terminating in the SVC. Complete opacification of the lungs  bilaterally obscuring the heart mediastinal contours remains. No significant change. No pneumothorax. A right PICC line probably terminates in the right subclavian vein, unchanged. Orthopedic fixation hardware remains in the lower cervical spine, only partly imaged today. IMPRESSION: 1. Support apparatus as above. 2. Complete opacification of the lungs bilaterally, unchanged in the interval. Electronically Signed   By: Dorise Bullion III M.D   On: 01/28/2020 08:25   DG CHEST PORT 1 VIEW  Result Date: 01/27/2020 CLINICAL DATA:  54 year old female with history of cardiorespiratory failure. EXAM: PORTABLE CHEST 1 VIEW COMPARISON:  Chest x-ray 01/26/2020. FINDINGS: ECMO cannula noted. Left internal jugular central venous catheter with tip terminating in the proximal superior vena cava. A feeding tube is seen extending into the abdomen, however, the tip of the feeding tube extends below the lower margin of the image. Complete opacification of the lungs bilaterally obscuring the heart and mediastinal contours. No pneumothorax. Orthopedic fixation hardware throughout the lower cervical spine incidentally noted. IMPRESSION: 1. Support apparatus, as above. 2. Complete opacification of the lungs bilaterally, similar to the prior study. Electronically Signed   By: Vinnie Langton M.D.   On: 01/27/2020 08:33   DG CHEST PORT 1 VIEW  Result Date: 01/26/2020 CLINICAL DATA:  Hypoxia EXAM: PORTABLE CHEST 1 VIEW COMPARISON:  January 25, 2020 FINDINGS: ECMO catheter tip is below the diaphragm. Feeding tube tip is below the diaphragm. Left jugular catheter tip is in the superior vena cava. No pneumothorax. Diffuse opacification of the lungs bilaterally with likely superimposed pleural effusions noted. Heart is obscured by diffuse airspace opacity. Grossly stable cardiac silhouette. Postoperative change noted in the lower cervical spine. IMPRESSION: Tube and catheter positions as described without appreciable pneumothorax.  Diffuse airspace opacity throughout the lungs with likely superimposed pleural effusions noted. Grossly stable cardiac silhouette. Electronically Signed   By: Lowella Grip III M.D.   On: 01/26/2020 08:05   DG CHEST PORT 1 VIEW  Result Date: 01/25/2020 CLINICAL DATA:  Respiratory failure.  ECMO.  COVID positive. EXAM: PORTABLE CHEST 1 VIEW COMPARISON:  01/24/2020. FINDINGS: Feeding tube, left IJ line, right PICC line, ECMO device in stable position. Complete opacification of both lungs again noted. No interim change. Heart size cannot be accessed due to the complete of opacification of both hemi thoraces. No pneumothorax.  Prior cervical spine fusion. IMPRESSION: 1. Feeding tube, left IJ line, right PICC line, ECMO device in stable position. 2.  Complete opacification of both lungs again noted. Electronically Signed   By: Marcello Moores  Register   On: 01/25/2020 07:10   DG CHEST PORT 1 VIEW  Result Date: 01/24/2020 CLINICAL DATA:  Respiratory failure.  COVID-19 positive EXAM: PORTABLE CHEST 1 VIEW COMPARISON:  January 23, 2020 FINDINGS: ECMO catheter extends along the right medial hemithorax with tip below the diaphragm. Left jugular catheter tip is in the superior vena cava. Feeding tube tip extends below the diaphragm. No pneumothorax evident. Diffuse airspace opacity with probable underlying pleural effusions persists. Diffuse airspace opacity obscures heart borders. No gross cardiomegaly appreciable. There is postoperative change in the lower cervical region. IMPRESSION: Tube and catheter positions as described without pneumothorax. Diffuse airspace opacity essentially completely obscuring the lung parenchyma bilaterally. There may be underlying pleural effusions. Appearance similar to 1 day prior. Electronically Signed   By: Lowella Grip III M.D.   On: 01/24/2020 08:20   DG CHEST PORT 1 VIEW  Result Date: 01/23/2020 CLINICAL DATA:  Respiratory failure.  COVID positive.  ECMO. EXAM: PORTABLE CHEST 1  VIEW COMPARISON:  01/23/2020. FINDINGS: Feeding tube, left IJ line, ECMO device in stable position. Complete opacification of both lungs again noted. Heart size cannot be evaluated. No pneumothorax. Prior cervical fusion. IMPRESSION: 1.  Lines and tubes including ECMO catheter stable position. 2. Complete opacification of both lungs again noted without interim change. Electronically Signed   By: Marcello Moores  Register   On: 01/23/2020 07:23   DG CHEST PORT 1 VIEW  Result Date: 01/23/2020 CLINICAL DATA:  Cardio respiratory failure EXAM: PORTABLE CHEST 1 VIEW COMPARISON:  Radiograph yesterday.  CT 01/19/2020 FINDINGS: The endotracheal tube is not visualized on the current exam. There is an enteric tube in place with tip below the diaphragm. Left internal jugular central venous catheter tip projects in the region of the upper SVC. ECMO catheter tip below the diaphragm not included in the field of view. There is a right upper extremity PICC tip in the SVC. Near complete opacification of both hemi thoraces with only small portion of aerated lung in the upper lobes. Near complete bilateral lung white out. Cardiomediastinal contours are obscured and not well visualized. IMPRESSION: 1. Progressive advanced bilateral airspace disease with diffuse bilateral lung opacity. 2. Endotracheal tube is not visualized  on the current exam. Remaining support apparatus unchanged. Electronically Signed   By: Keith Rake M.D.   On: 01/23/2020 00:55   DG CHEST PORT 1 VIEW  Result Date: 01/22/2020 CLINICAL DATA:  On ECMO.  COVID-19 positive. EXAM: PORTABLE CHEST 1 VIEW COMPARISON:  1 day prior FINDINGS: Endotracheal tube terminates 5.0 cm above carina. Nasogastric tube extends beyond the inferior aspect of the film. ECMO catheter unchanged in position. Left internal jugular line tip at low SVC. Right-sided PICC line is difficult to follow centrally. Diffuse white out of the chest, relatively similar with mild progression in the left  upper lung. No pneumothorax. IMPRESSION: Minimally worsened left-sided aeration with complete whiteout of the chest. No pneumothorax or other acute complication. Electronically Signed   By: Abigail Miyamoto M.D.   On: 01/22/2020 08:02   DG CHEST PORT 1 VIEW  Result Date: 01/21/2020 CLINICAL DATA:  NG tube placement. EXAM: PORTABLE CHEST 1 VIEW COMPARISON:  01/21/2020 FINDINGS: Endotracheal tube with tip 4.2 cm above the carina. Right-sided PICC line has tip over the region of the SVC. Left IJ central venous catheter has tip over the SVC. Interval placement of enteric tube which courses into the region of the stomach and off the film as tip is not visualized. ECMO apparatus unchanged. Lungs are adequately inflated demonstrate persistent severe airspace consolidation bilaterally with air bronchograms and slightly improved aeration over the left apex. Findings likely due to combination of infection and ARDS. Possible associated bilateral effusions/bibasilar atelectasis. Remainder of the exam is unchanged. IMPRESSION: 1. Persistent severe bilateral airspace process with air bronchograms with slightly improved aeration over the left apex. Findings likely due to combination of infection and ARDS. Possible associated bilateral effusions/bibasilar atelectasis. 2. Tubes and lines as described. Electronically Signed   By: Marin Olp M.D.   On: 01/21/2020 13:51   DG CHEST PORT 1 VIEW  Result Date: 01/21/2020 CLINICAL DATA:  COVID-19 positivity with ECMO EXAM: PORTABLE CHEST 1 VIEW COMPARISON:  01/20/2020 FINDINGS: Endotracheal tube and left jugular central line is well as ECMO cannula are again identified. Right-sided PICC line is seen and stable. Feeding catheter has been removed in the interval. Increasing opacity is noted throughout both lungs consistent with progression of consolidation. IMPRESSION: Increasing opacity in the lungs bilaterally. Tubes and lines as described. Electronically Signed   By: Inez Catalina  M.D.   On: 01/21/2020 07:17   DG CHEST PORT 1 VIEW  Result Date: 01/20/2020 CLINICAL DATA:  Respiratory failure, COVID-19 positivity and ECMO EXAM: PORTABLE CHEST 1 VIEW COMPARISON:  01/19/2020 FINDINGS: Cardiac shadow is stable. Left jugular central line, endotracheal tube and feeding catheter are noted in satisfactory position. ECMO cannulas are noted and stable. Diffuse bilateral airspace opacities are noted consistent with the given clinical history. IMPRESSION: Stable appearance of the chest when compared with the prior exam. Electronically Signed   By: Inez Catalina M.D.   On: 01/20/2020 08:27   DG CHEST PORT 1 VIEW  Result Date: 01/19/2020 CLINICAL DATA:  Hypoxia EXAM: PORTABLE CHEST 1 VIEW COMPARISON:  January 18, 2020 FINDINGS: Endotracheal tube tip is 2.8 cm above the carina. Feeding tube tip is below the diaphragm. Central catheter tips are in the superior vena cava. ECMO catheter extends below the diaphragm on the right. No pneumothorax. There is widespread airspace opacity bilaterally with underlying pleural effusions. Heart is prominent, stable. Pulmonary vascularity largely obscured by overlying airspace opacity. Postoperative change noted in the lower cervical region. IMPRESSION: Persistent widespread airspace opacity with underlying  pleural effusions. Tube and catheter positions as described without pneumothorax. Stable cardiac silhouette. Electronically Signed   By: Lowella Grip III M.D.   On: 01/19/2020 08:20   DG CHEST PORT 1 VIEW  Result Date: 01/18/2020 CLINICAL DATA:  COVID-19 positivity, ECMO EXAM: PORTABLE CHEST 1 VIEW COMPARISON:  01/17/2020 FINDINGS: Endotracheal tube, feeding catheter, left jugular central line and right-sided PICC line are again identified and stable. Large ECMO cannula is noted in the right neck. Cardiac shadow is stable. Slight improved aeration is noted in both lungs although persistent opacities remain consistent with the given clinical history. No  bony abnormality is noted. IMPRESSION: Slight improved aeration bilaterally. Tubes and lines as described. Electronically Signed   By: Inez Catalina M.D.   On: 01/18/2020 08:18   DG CHEST PORT 1 VIEW  Result Date: 01/17/2020 CLINICAL DATA:  COVID positive on ECMO EXAM: PORTABLE CHEST 1 VIEW COMPARISON:  Earlier same day FINDINGS: Endotracheal tube, left IJ central line, right PICC line, and enteric tube are again identified. ECMO cannula is also again seen. Persistent diffuse opacification of bilateral lungs. Cardiomediastinal contours are obscured. IMPRESSION: Lines and tubes as above. Similar appearance of diffuse bilateral pulmonary opacification. Electronically Signed   By: Macy Mis M.D.   On: 01/17/2020 09:34   DG CHEST PORT 1 VIEW  Result Date: 01/17/2020 CLINICAL DATA:  COVID-19 positivity EXAM: PORTABLE CHEST 1 VIEW COMPARISON:  01/16/2020 FINDINGS: Diffuse airspace opacity is identified stable in appearance from the prior exam. Feeding catheter, endotracheal tube, left jugular central line and ECMO cannula are again noted and stable. Right-sided PICC line is noted in satisfactory position. IMPRESSION: Tubes and lines as described stable in appearance. Diffuse opacification of lungs bilaterally with minimal aeration in the central aspect of the right lung. Electronically Signed   By: Inez Catalina M.D.   On: 01/17/2020 08:25   DG CHEST PORT 1 VIEW  Result Date: 01/16/2020 CLINICAL DATA:  Cardio respiratory failure EXAM: PORTABLE CHEST 1 VIEW COMPARISON:  01/16/2020 FINDINGS: Support devices including ECMO are stable. Near complete opacification of both lungs, worsening since prior study. Difficult to visualize the heart. IMPRESSION: Worsening aeration with near complete opacification of both lungs. Electronically Signed   By: Rolm Baptise M.D.   On: 01/16/2020 22:51   DG CHEST PORT 1 VIEW  Result Date: 01/16/2020 CLINICAL DATA:  ECMO EXAM: PORTABLE CHEST 1 VIEW COMPARISON:  01/16/2020  FINDINGS: Support devices are stable. Diffuse airspace disease throughout the lungs, stable or slightly worsening since prior study. Suspect layering effusions. Heart is mildly enlarged. No acute bony abnormality. IMPRESSION: Severe diffuse bilateral airspace disease, stable or slightly worsening. Suspect layering effusions. Electronically Signed   By: Rolm Baptise M.D.   On: 01/16/2020 18:19   DG Chest Port 1 View in am  Result Date: 01/16/2020 CLINICAL DATA:  Hypoxia EXAM: PORTABLE CHEST 1 VIEW COMPARISON:  January 15, 2020. FINDINGS: Endotracheal tube tip is 3.6 cm above the carina. ECMO catheter tip is below the diaphragm. Feeding tube tip is below the diaphragm. Central catheter tip is in the superior vena cava. No pneumothorax. There are pleural effusions bilaterally with multifocal airspace opacity, stable. Heart is enlarged, stable. No adenopathy appreciable. Postoperative change noted in the lower cervical region. IMPRESSION: Tube and catheter positions as described without pneumothorax. Pleural effusions bilaterally with widespread airspace consolidation persists. Stable cardiomegaly. Electronically Signed   By: Lowella Grip III M.D.   On: 01/16/2020 08:19   DG Chest [facility] 1 View  Result  Date: 01/12/2020 CLINICAL DATA:  ECMO. EXAM: PORTABLE CHEST 1 VIEW COMPARISON:  Earlier today. FINDINGS: Interval right jugular ECMO catheter extending into the mid to lower abdomen, with its tip not included. Feeding tube tip in the mid stomach. Endotracheal tube tip 1.2 cm above the carina. Left jugular catheter tip in the superior vena cava. Right PICC tip in the region of the superior cavoatrial junction. Extensive dense bilateral airspace opacity with mild improvement. Interval visualization of the right heart border without visualization of the left heart borders. Mild thoracic spine degenerative changes. IMPRESSION: 1. Interval right jugular ECMO catheter extending into the mid to lower abdomen, with its  tip not included. 2. Extensive dense bilateral pneumonia for alveolar edema with mild improvement. 3. Endotracheal tube tip 1.2 cm above the carina. This could be retracted 3 cm for better positioning. These results will be called to the ordering clinician or representative by the Radiologist Assistant, and communication documented in the PACS or [employer]. Electronically Signed   By: Claudie Revering M.D.   On: 01/07/2020 21:21   DG Chest Port 1 View  Result Date: 01/14/2020 CLINICAL DATA:  54 year old female with respiratory failure due to COVID 19. EXAM: PORTABLE CHEST 1 VIEW COMPARISON:  01/13/2020 FINDINGS: The ETT tip is stable above the carina. There is a feeding tube with tip noted just below the level of the GE junction. The right arm PICC line tip is in the distal SVC. Significant interval progression of bilateral pulmonary opacities. IMPRESSION: 1. Significant interval progression of bilateral pulmonary opacities compatible with progressive multifocal pneumonia. 2. Stable support apparatus. 3. Tip of the feeding tube is just below the level of the GE junction. Electronically Signed   By: Kerby Moors M.D.   On: 01/19/2020 10:27   DG Chest Port 1 View  Result Date: 01/13/2020 CLINICAL DATA:  Respiratory failure EXAM: PORTABLE CHEST 1 VIEW COMPARISON:  01/12/2020 FINDINGS: Endotracheal tube tip is at the level of the clavicular heads. Esophageal tube is poorly visualized on the current study. Bilateral airspace opacities are unchanged. IMPRESSION: Unchanged bilateral airspace opacities Electronically Signed   By: Ulyses Jarred M.D.   On: 01/13/2020 05:53   DG CHEST PORT 1 VIEW  Result Date: 01/12/2020 CLINICAL DATA:  COVID positive EXAM: PORTABLE CHEST 1 VIEW COMPARISON:  August fifth 2021 FINDINGS: The cardiomediastinal silhouette is unchanged and enlarged in contour.He remains partially obscured. ETT tip terminates 2.4 cm above the carina. The enteric tube courses through the chest to the  abdomen beyond the field-of-view. RIGHT upper extremity PICC tip terminates over the superior cavoatrial junction. No pleural effusion. No pneumothorax. Diffuse airspace opacities bilaterally, similar to minimally improved in comparison to prior. Visualized abdomen is unremarkable. No acute osseous abnormality appreciated. IMPRESSION: 1. Diffuse airspace opacities bilaterally, similar to minimally improved in comparison to prior and consistent with the sequela of COVID-19 infection. 2. Stable support apparatus. Electronically Signed   By: Valentino Saxon MD   On: 01/12/2020 09:44   DG Chest Port 1 View  Result Date: 01/11/2020 CLINICAL DATA:  COVID-19 positive. EXAM: PORTABLE CHEST 1 VIEW COMPARISON:  January 11, 2020. FINDINGS: Endotracheal and feeding tubes are unchanged in position. Right-sided PICC line is again noted and unchanged. Diffuse lung opacities are noted bilaterally consistent with multifocal pneumonia. No definite pneumothorax or significant pleural effusion is noted. Bony thorax is unremarkable. IMPRESSION: Stable support apparatus. Stable bilateral diffuse lung opacities consistent with multifocal pneumonia. Electronically Signed   By: Bobbe Medico.D.  On: 01/11/2020 09:35   DG Chest Port 1 View  Result Date: 01/11/2020 CLINICAL DATA:  Respiratory failure.  COVID. EXAM: PORTABLE CHEST 1 VIEW COMPARISON:  01/10/2020. FINDINGS: Endotracheal tube, feeding tube, right PICC line in stable position. Heart size normal. Progressive diffuse severe bilateral pulmonary infiltrates with near complete opacification of both lungs. No pleural effusion or pneumothorax. Prior cervical spine fusion. IMPRESSION: 1. Endotracheal tube, feeding tube, right PICC line stable position. 2. Progressive diffuse severe bilateral pulmonary infiltrates with near complete opacification of both lungs. Electronically Signed   By: Marcello Moores  Register   On: 01/11/2020 07:08   DG Chest Port 1 View  Result Date:  01/10/2020 CLINICAL DATA:  54 year old female with intubation. EXAM: PORTABLE CHEST 1 VIEW COMPARISON:  Earlier radiograph dated 01/10/2020. FINDINGS: Bilateral pulmonary opacities as seen on the earlier radiograph. No large pleural effusion. No pneumothorax. Stable cardiac silhouette. No acute osseous pathology. IMPRESSION: Bilateral pulmonary opacities as seen on the earlier radiograph. Continued follow-up recommended. Electronically Signed   By: Anner Crete M.D.   On: 01/10/2020 19:59   DG CHEST PORT 1 VIEW  Result Date: 01/10/2020 CLINICAL DATA:  Hypoxia EXAM: PORTABLE CHEST 1 VIEW COMPARISON:  January 10, 2020 study obtained earlier in the day FINDINGS: Endotracheal tube tip is 4.1 cm above the carina. Nasogastric tube tip and side port are below the diaphragm. Central catheter tip is in the superior vena cava near the cavoatrial junction. No pneumothorax. Multifocal airspace opacity throughout the lungs bilaterally is stable compared to earlier in the day. Heart is upper normal in size with pulmonary vascularity normal. No adenopathy. No bone lesions. Postoperative change noted in the lower cervical spine. IMPRESSION: Tube and catheter positions as described without pneumothorax. Widespread multifocal airspace opacity appear stable consistent with multifocal pneumonia. A degree of underlying edema and/or ARDS is possible as well. Stable cardiac silhouette. Electronically Signed   By: Lowella Grip III M.D.   On: 01/10/2020 11:51   DG Chest Port 1 View  Result Date: 01/10/2020 CLINICAL DATA:  Endotracheal tube positioning EXAM: PORTABLE CHEST 1 VIEW COMPARISON:  Earlier today FINDINGS: Endotracheal tube tip at the clavicular heads. The enteric tube reaches the stomach. Right PICC with tip at the lower SVC. Severe bilateral airspace disease. Lung volumes are low. Largely obscured heart which appears normal size. IMPRESSION: Unchanged hardware positioning and severe airspace disease. Electronically  Signed   By: Monte Fantasia M.D.   On: 01/10/2020 06:12   DG Chest Port 1 View  Result Date: 01/10/2020 CLINICAL DATA:  Hypoxia.  COVID-19 ARDS. EXAM: PORTABLE CHEST 1 VIEW COMPARISON:  01/09/2020 FINDINGS: Unchanged position of endotracheal tube with tip at the level of the clavicular heads. Esophageal tube courses below the field of view. Bilateral airspace opacities have worsened. IMPRESSION: Worsening opacities, likely pulmonary edema. Electronically Signed   By: Ulyses Jarred M.D.   On: 01/10/2020 01:38   DG CHEST PORT 1 VIEW  Result Date: 01/09/2020 CLINICAL DATA:  Status post intubation. EXAM: PORTABLE CHEST 1 VIEW COMPARISON:  January 09, 2020 (1:21 p.m.) FINDINGS: An endotracheal tube is seen with its distal tip approximately 2.9 cm from the carina. A nasogastric tube is noted with its distal end overlying the body of the stomach. Mild diffusely increased lung markings are seen which is decreased in severity when compared to the prior study. Mild atelectasis and/or infiltrate is noted within the mid left lung and bilateral lung bases. The cardiac silhouette is moderately enlarged. The visualized skeletal structures are unremarkable.  IMPRESSION: 1. Endotracheal tube and nasogastric tube positioning, as described above. 2. Mild atelectasis and/or infiltrate noted within the mid left lung and bilateral lung bases. Electronically Signed   By: Virgina Norfolk M.D.   On: 01/09/2020 17:09   DG CHEST PORT 1 VIEW  Result Date: 01/09/2020 CLINICAL DATA:  Hypoxia.  Reported COVID-19 positive EXAM: PORTABLE CHEST 1 VIEW COMPARISON:  January 08, 2020 FINDINGS: There is widespread airspace opacity bilaterally, overall increased compared to 1 day prior. Heart is upper normal in size with pulmonary vascularity normal. No adenopathy appreciable. Postoperative change noted in the lower cervical region. IMPRESSION: Widespread airspace opacity bilaterally, increased from 1 day prior. Suspect multifocal pneumonia,  likely of atypical organism etiology, particularly given the clinical history. Note that a degree of pulmonary edema and/or ARDS superimposed cannot be excluded radiographically. Stable cardiac silhouette. Electronically Signed   By: Lowella Grip III M.D.   On: 01/09/2020 13:42   DG Chest Port 1 View  Result Date: 02/05/2020 CLINICAL DATA:  Shortness of breath.  COVID-19 positive EXAM: PORTABLE CHEST 1 VIEW COMPARISON:  January 06, 2020 FINDINGS: There is ill-defined patchy airspace disease in the left mid lung and lung base regions. No consolidation. Heart size and pulmonary vascularity are normal. No adenopathy. There is postoperative change in the lower cervical region. IMPRESSION: Areas of somewhat ill-defined patchy airspace disease, likely due to atypical organism pneumonia. Heart size and pulmonary vascular normal. No adenopathy. Electronically Signed   By: Lowella Grip III M.D.   On: 02/06/2020 11:09   DG Chest Port 1 View  Result Date: 01/06/2020 CLINICAL DATA:  COVID-19 positivity with headaches and chest pain, initial encounter EXAM: PORTABLE CHEST 1 VIEW COMPARISON:  06/28/2004 FINDINGS: Cardiac shadow is within normal limits. The lungs are well aerated bilaterally. Minimal bibasilar atelectatic changes are seen. No bony abnormality is noted. IMPRESSION: Mild bibasilar atelectasis. Electronically Signed   By: Inez Catalina M.D.   On: 01/06/2020 19:20   VAS [place] UPPER EXTREMITY ARTERIAL DUPLEX  Result Date: 01/29/2020 UPPER EXTREMITY DUPLEX STUDY Indications: Patient complains of discoloration of three digits on the right              hand. History:     Patient has a history ofCovid positive - acute hypoxic respiratory              failure requiring ECMO.  Performing Technologist: Oda Cogan RDMS, RVT  Examination Guidelines: A complete evaluation includes B-mode imaging, spectral Doppler, color Doppler, and power Doppler as needed of all accessible portions of each vessel. Bilateral  testing is considered an integral part of a complete examination. Limited examinations for reoccurring indications may be performed as noted.  Right Doppler Findings: +---------------+----------+---------+--------+--------+ Site           PSV (cm/s)Waveform StenosisComments +---------------+----------+---------+--------+--------+ Subclavian Dist                                    +---------------+----------+---------+--------+--------+ Brachial Dist  103       triphasic                 +---------------+----------+---------+--------+--------+ Radial Prox    53        triphasic                 +---------------+----------+---------+--------+--------+ Radial Mid     55        triphasic                 +---------------+----------+---------+--------+--------+  Radial Dist    72        triphasic                 +---------------+----------+---------+--------+--------+ Ulnar Prox     75        triphasic                 +---------------+----------+---------+--------+--------+ Ulnar Mid      57        triphasic                 +---------------+----------+---------+--------+--------+ Ulnar Dist     83        biphasic                  +---------------+----------+---------+--------+--------+ Palmar Arch              Patent                    +---------------+----------+---------+--------+--------+   Summary:  Right: Patient right upper extremity arterial system and right        Palmar Arch. *See table(s) above for measurements and observations. Electronically signed by Ruta Hinds MD on 01/29/2020 at 5:11:31 PM.    Final    ECHOCARDIOGRAM COMPLETE  Result Date: 01/16/2020    ECHOCARDIOGRAM REPORT   Patient Name:   Alisha Stephenson Date of Exam: 01/16/2020 Medical Rec #:  [id number]       Height:       64.0 in Accession #:    [id number]      Weight:       293.7 lb Date of Birth:  [birth year]        BSA:          2.303 m Patient Age:    34 years        BP:           109/49  mmHg Patient Gender: F               HR:           132 bpm. Exam Location:  Inpatient Procedure: 2D Echo, Cardiac Doppler and Color Doppler Indications:    Acute resp. insufficiency  History:        Patient has prior history of Echocardiogram examinations, most                 recent 01/10/2020. Signs/Symptoms:Shortness of Breath; Risk                 Factors:Diabetes and Obesity. Covid, resp. failure.  Sonographer:    Dustin Flock Referring Phys: [id number] DANIEL R BENSIMHON IMPRESSIONS  1. Left ventricular ejection fraction, by estimation, is >75%. The left ventricle has hyperdynamic function. The left ventricle has no regional wall motion abnormalities. Left ventricular diastolic function could not be evaluated.  2. Right ventricular systolic function is hyperdynamic. The right ventricular size is normal. Tricuspid regurgitation signal is inadequate for assessing PA pressure.  3. The mitral valve is grossly normal. No evidence of mitral valve regurgitation.  4. The aortic valve was not well visualized. Aortic valve regurgitation is not visualized.  5. The inferior vena cava is normal in size with <50% respiratory variability, suggesting right atrial pressure of 8 mmHg. FINDINGS  Left Ventricle: Left ventricular ejection fraction, by estimation, is >75%. The left ventricle has hyperdynamic function. The left ventricle has no regional wall motion abnormalities. The left ventricular internal cavity size was normal in size. There is no left ventricular  hypertrophy. Left ventricular diastolic function could not be evaluated. Right Ventricle: The right ventricular size is normal. No increase in right ventricular wall thickness. Right ventricular systolic function is hyperdynamic. Tricuspid regurgitation signal is inadequate for assessing PA pressure. Left Atrium: Left atrial size was normal in size. Right Atrium: Right atrial size was normal in size. Pericardium: There is no evidence of pericardial effusion. Mitral  Valve: The mitral valve is grossly normal. No evidence of mitral valve regurgitation. Tricuspid Valve: The tricuspid valve is not assessed. Tricuspid valve regurgitation is not demonstrated. Aortic Valve: The aortic valve was not well visualized. Aortic valve regurgitation is not visualized. Pulmonic Valve: The pulmonic valve was not assessed. Pulmonic valve regurgitation is not visualized. Aorta: The aortic root was not well visualized. Venous: The inferior vena cava is normal in size with less than 50% respiratory variability, suggesting right atrial pressure of 8 mmHg. IAS/Shunts: The interatrial septum was not assessed. Sanda Klein MD Electronically signed by Sanda Klein MD Signature Date/Time: 01/16/2020/9:56:05 AM    Final    ECHOCARDIOGRAM COMPLETE  Result Date: 01/10/2020    ECHOCARDIOGRAM REPORT   Patient Name:   Alisha Stephenson Date of Exam: 01/10/2020 Medical Rec #:  [id number]       Height:       64.0 in Accession #:    [id number]      Weight:       270.0 lb Date of Birth:  03/12/1966        BSA:          2.222 m Patient Age:    81 years        BP:           105/67 mmHg Patient Gender: F               HR:           98 bpm. Exam Location:  Inpatient Procedure: 2D Echo, Cardiac Doppler and Color Doppler                       STAT ECHO Reported to: Dr Dani Gobble Croitoru on 01/10/2020 1:20:00 PM. Indications:    Acute Respiratory Insufficiency 518.82 / R06.89  History:        Patient has prior history of Echocardiogram examinations, most                 recent 03/28/2015. Risk Factors:Diabetes. COVID-19 Positive.  Sonographer:    Tiffany Dance Referring Phys: [id number] PETER E BABCOCK  Sonographer Comments: Echo performed with patient supine and on artificial respirator, suboptimal subcostal window, Technically difficult study due to poor echo windows and patient is morbidly obese. IMPRESSIONS  1. Left ventricular ejection fraction, by estimation, is 60 to 65%. The left ventricle has normal function. The left  ventricle has no regional wall motion abnormalities. Left ventricular diastolic parameters are consistent with Grade I diastolic dysfunction (impaired relaxation).  2. Right ventricular systolic function is normal. The right ventricular size is normal.  3. The mitral valve is normal in structure. No evidence of mitral valve regurgitation. No evidence of mitral stenosis.  4. The aortic valve is normal in structure. Aortic valve regurgitation is not visualized. No aortic stenosis is present.  5. The inferior vena cava is normal in size with greater than 50% respiratory variability, suggesting right atrial pressure of 3 mmHg. FINDINGS  Left Ventricle: Left ventricular ejection fraction, by estimation, is 60 to 65%. The left ventricle has normal function. The  left ventricle has no regional wall motion abnormalities. The left ventricular internal cavity size was normal in size. There is  no left ventricular hypertrophy. Left ventricular diastolic parameters are consistent with Grade I diastolic dysfunction (impaired relaxation). Indeterminate filling pressures. Right Ventricle: The right ventricular size is normal. No increase in right ventricular wall thickness. Right ventricular systolic function is normal. Left Atrium: Left atrial size was normal in size. Right Atrium: Right atrial size was normal in size. Pericardium: There is no evidence of pericardial effusion. Mitral Valve: The mitral valve is normal in structure. Normal mobility of the mitral valve leaflets. No evidence of mitral valve regurgitation. No evidence of mitral valve stenosis. Tricuspid Valve: The tricuspid valve is normal in structure. Tricuspid valve regurgitation is not demonstrated. No evidence of tricuspid stenosis. Aortic Valve: The aortic valve is normal in structure. Aortic valve regurgitation is not visualized. No aortic stenosis is present. Pulmonic Valve: The pulmonic valve was normal in structure. Pulmonic valve regurgitation is not  visualized. No evidence of pulmonic stenosis. Aorta: The aortic root is normal in size and structure. Venous: The inferior vena cava is normal in size with greater than 50% respiratory variability, suggesting right atrial pressure of 3 mmHg. IAS/Shunts: No atrial level shunt detected by color flow Doppler.  LEFT VENTRICLE PLAX 2D LVIDd:         3.70 cm  Diastology LVIDs:         2.70 cm  LV e' lateral:   4.57 cm/s LV PW:         1.20 cm  LV E/e' lateral: 15.4 LV IVS:        1.00 cm  LV e' medial:    4.57 cm/s LVOT diam:     2.20 cm  LV E/e' medial:  15.4 LV SV:         59 LV SV Index:   26 LVOT Area:     3.80 cm  RIGHT VENTRICLE RV Basal diam:  2.70 cm RV S prime:     13.60 cm/s TAPSE (M-mode): 1.2 cm LEFT ATRIUM             Index       RIGHT ATRIUM           Index LA diam:        3.10 cm 1.39 cm/m  RA Area:     10.40 cm LA Vol (A2C):   27.5 ml 12.37 ml/m RA Volume:   19.50 ml  8.77 ml/m LA Vol (A4C):   26.5 ml 11.92 ml/m LA Biplane Vol: 26.9 ml 12.10 ml/m  AORTIC VALVE LVOT Vmax:   92.80 cm/s LVOT Vmean:  59.300 cm/s LVOT VTI:    0.154 m  AORTA Ao Root diam: 2.90 cm Ao Asc diam:  3.20 cm MITRAL VALVE MV Area (PHT): 2.48 cm    SHUNTS MV Decel Time: 306 msec    Systemic VTI:  0.15 m MV E velocity: 70.40 cm/s  Systemic Diam: 2.20 cm MV A velocity: 76.40 cm/s MV E/A ratio:  0.92 Mihai Croitoru MD Electronically signed by Sanda Klein MD Signature Date/Time: 01/10/2020/1:25:37 PM    Final    [place] EKG SITE RITE  Result Date: 01/24/2020 If Site Rite image not attached, placement could not be confirmed due to current cardiac rhythm.  [place] EKG SITE RITE  Result Date: 01/09/2020 If Site Rite image not attached, placement could not be confirmed due to current cardiac rhythm.  US Abdomen Limited RUQ  Result Date: 01/18/2020  CLINICAL DATA:  Hyperbilirubinemia.  History of cholecystectomy. EXAM: ULTRASOUND ABDOMEN LIMITED RIGHT UPPER QUADRANT COMPARISON:  None. FINDINGS: Gallbladder: Surgically absent. Common bile  duct: Diameter: 3 mm Liver: Diffusely echogenic indicating fatty infiltration. No focal mass or lesion is identified within the liver. Portal vein is patent on color Doppler imaging with normal direction of blood flow towards the liver. Other: None. IMPRESSION: 1. No acute findings. 2. No bile duct dilatation. 3. Fatty infiltration of the liver. 4. Status post cholecystectomy. Electronically Signed   By: Franki Cabot M.D.   On: 01/18/2020 16:29    ASSESSMENT & PLAN Alisha Stephenson 54 y.o. female with medical history significant for DM type II, IBS, NAFLD, and asthma who is currently admitted to the CCU on ECMO for COVID infection.  This time the patient is critically ill and her thrombocytopenia is likely multifactorial in nature.  The causes likely include critical illness, antibiotic therapy, systemic inflammation, and likely component of clotting in the ECMO circuit and DIC.  Her fibrinogen has consistently been noted to be less than 60.  In order to help effectively rule out DIC I will personally review a peripheral blood film and will order haptoglobin, and immature platelet fraction.  Additionally there is a need to keep the platelet count elevated due to the patient's intracranial hemorrhage.  Overall the best treatment for her thrombocytopenia would be best supportive care with transfusions as needed.  Our work-up below will attempt to rule out alternative etiologies.  #Thrombocytopenia in Setting of Critical Illness --the cause of this patient's thrombocytopenia is likely multifactorial in nature and includes critical illness, antibiotics, systemic inflammation, and likely component of DIC/ clotting of ECMO circuit --fibrinogen noted to be <60, target should be >100.  --will personally review peripheral blood film for clumping/schistocytes --transfusion goal of Platelets >20 in setting of stable ICH --agree with test trial of platelets to determine if issue is under production or clotting in  ECMO --will order peripheral blood smear, immature platelet fraction,  haptoglobin.  --agree with goals of care discussions in the setting of critical illness.    All questions were answered. The patient knows to call the clinic with any problems, questions or concerns.  A total of more than 55 minutes were spent on this encounter and over half of that time was spent on counseling and coordination of care as outlined above.   Ledell Peoples, MD Department of Hematology/Oncology [facility] Phone: [phone] Pager: [phone] Email: [email or web address]  01/29/2020 9:43 PM

## 2020-01-29 NOTE — Progress Notes (Signed)
[facility] NEPHROLOGY PROGRESS NOTE  Assessment/ Plan: Pt is a 54 y.o. yo female  With PMH of DM2, obesity, NAFLD who present w/ acute hypoxic respiratory failure due to Covid.  Hospitalization complicated by severe shock, refractory hypoxia requiring ECMO, multiple infections, intracranial hemorrhage, encephalopathy, AKI.  # Non-Oliguric AKI due to shock causing acute tubular necrosis: Remains nonoliguric however had significant uremia with BUN around 200 and volume overload.  She has ECMO.  Started CRRT on 8/22.  Currently on 2K bath with normal serum potassium level.  No problem with filters.  UF as tolerated by BP.  Avoiding heparin products given intracranial hemorrhage Dose medication per CRRT.,  Pharmacist following Maintain MAP>65 for optimal renal perfusion.  Foley catheter for accurate ins and outs.  #Shock due to sepsis, Covid infection: On pressors, antibiotics per primary team.  #Acute hypoxic and hypercapnic respiratory failure, ARDS due to COVID-19 pneumonia: Currently on ECMO for refractory hypoxemia, MSSA pneumonia.  On broad-spectrum antibiotics.   #Hypernatremia, hypervolemia: relative free water deficit.  Improved with CRRT.  #Acute toxic metabolic encephalopathy: Multifactorial with uremia contributing.  Treatment with CRRT as above.  #Anemia, thrombocytopenia: With hemolysis likely and acute illness contributing.  Transfusions per primary team.  Consider hematology consult  #Intracranial hemorrhage: With some left-sided weakness preceded by primary team.  Avoiding heparin products as able  Subjective: Seen and examined.  CRRT and ECMO running.  Patient is sedated.  Review of system unable to obtain.  Urine output 1.5 L. Objective Vital signs in last 24 hours: Vitals:   01/29/20 0645 01/29/20 0700 01/29/20 0715 01/29/20 0727  BP:  (!) 117/57  (!) 117/57  Pulse: 80 81 (!) 113 83  Resp: (!) 59 (!) 60 (!) 40 (!) 40  Temp: (!) 97.2 F (36.2 C) (!)  97.2 F (36.2 C) (!) 97.3 F (36.3 C) (!) 97.3 F (36.3 C)  TempSrc:      SpO2: 90% 90% (!) 87% (!) 88%  Weight:      Height:       Weight change: 4.5 kg  Intake/Output Summary (Last 24 hours) at 01/29/2020 0847 Last data filed at 01/29/2020 0700 Gross per 24 hour  Intake 5302.66 ml  Output 4265 ml  Net 1037.66 ml       Labs: Basic Metabolic Panel: Recent Labs  Lab 01/28/20 0308 01/28/20 0528 01/28/20 1559 01/28/20 1619 01/29/20 0451 01/29/20 0637 01/29/20 0744  NA 148*   < > 149*   < > 147* 146* 145  K 4.8   < > 5.9*   < > 4.8 4.2 4.7  CL 110  --  110  --  109  --   --   CO2 26  --  22  --  25  --   --   GLUCOSE 308*  --  207*  --  211*  --   --   BUN 188*  --  200*  --  151*  --   --   CREATININE 1.87*  --  1.90*  --  1.45*  --   --   CALCIUM 9.3  --  9.1  --  8.6*  --   --   PHOS  --   --   --   --  5.0*  --   --    < > = values in this interval not displayed.   Liver Function Tests: Recent Labs  Lab 01/27/20 0410 01/28/20 0308 01/29/20 0451  AST 40 35 65*  ALT  61* 61* 59*  ALKPHOS 135* 140* 137*  BILITOT 2.0* 1.4* 1.5*  PROT 5.0* 5.3* 5.4*  ALBUMIN 2.8* 2.8* 3.2*   No results for input(s): LIPASE, AMYLASE in the last 168 hours. No results for input(s): AMMONIA in the last 168 hours. CBC: Recent Labs  Lab 01/27/20 1505 01/27/20 1816 01/28/20 0308 01/28/20 0528 01/28/20 1559 01/28/20 1619 01/28/20 2354 01/28/20 2354 01/29/20 0451 01/29/20 0637 01/29/20 0744  WBC 11.0*   < > 10.8*   < > 9.9  --  7.9  --  9.3  --   --   HGB 8.6*   < > 8.8*   < > 7.7*   < > 8.5*   < > 8.6* 8.2* 8.5*  HCT 28.8*   < > 28.9*   < > 26.3*   < > 28.1*   < > 28.6* 24.0* 25.0*  MCV 103.6*  --  104.7*  --  103.5*  --  102.6*  --  101.8*  --   --   PLT 35*   < > 35*   < > 30*  --  29*  --  29*  --   --    < > = values in this interval not displayed.   Cardiac Enzymes: No results for input(s): CKTOTAL, CKMB, CKMBINDEX, TROPONINI in the last 168 hours. CBG: Recent  Labs  Lab 01/28/20 2349 01/29/20 0135 01/29/20 0450 01/29/20 0635 01/29/20 0741  GLUCAP 255* 201* 199* 194* 252*    Iron Studies: No results for input(s): IRON, TIBC, TRANSFERRIN, FERRITIN in the last 72 hours. Studies/Results: DG Chest 1 View  Result Date: 01/28/2020 CLINICAL DATA:  COVID-19 positive, ECMO, central line thrombosis EXAM: CHEST  1 VIEW COMPARISON:  Chest radiograph from earlier today. FINDINGS: Stable right internal jugular ECMO catheter entering the IVC. Left subclavian central venous catheter terminates over the high left mediastinum, unchanged. Left internal jugular central venous catheter is stable with tip in the region of middle third of the SVC. Right PICC enters the SVC with the tip not well visualized. Enteric tube enters the stomach with the tip not seen on this image. No pneumothorax. Complete opacification of the hemithoraces bilaterally, unchanged. IMPRESSION: 1. Stable support structures. No pneumothorax. Left subclavian central venous catheter tip overlies the high left mediastinum, unchanged, indeterminate in location as described on prior chest radiograph, potentially at the junction of the left internal jugular and left subclavian veins. 2. Stable complete opacification of the hemithoraces bilaterally. Electronically Signed   By: Ilona Sorrel M.D.   On: 01/28/2020 16:25   DG Chest 1 View  Result Date: 01/28/2020 CLINICAL DATA:  Status post line placement today. Shortness of breath. COVID-19 pneumonia. EXAM: CHEST  1 VIEW COMPARISON:  Single-view of the chest 01/28/2020. FINDINGS: New left subclavian central venous catheter tip projects medial to the left clavicular head. The catheter crosses the left IJ catheter and its position is unclear. It may be indenting medial wall of the junction of the left internal jugular and subclavian veins. ECMO cannula, feeding tube and left IJ catheter are unchanged. There is complete whiteout of the chest bilaterally. Cardiac  silhouette is not visible. IMPRESSION: Position of the tip of the patient's left subclavian catheter tip is indeterminate. It may be indenting the medial wall of the junction of the left subclavian and left internal jugular veins. No change in complete whiteout of the chest. Critical Value/emergent results were called by telephone at the time of interpretation on 01/28/2020 at 3:29 pm to  provider Glori Bickers , who verbally acknowledged these results. Electronically Signed   By: Inge Rise M.D.   On: 01/28/2020 15:33   DG CHEST PORT 1 VIEW  Result Date: 01/29/2020 CLINICAL DATA:  COVID 19 virus infection. Acute respiratory failure. On ECMO. EXAM: PORTABLE CHEST 1 VIEW COMPARISON:  01/28/2020 FINDINGS: Left subclavian central venous catheter tip is again seen overlying the distal left subclavian vein. Left jugular central venous catheter tip remains in the proximal SVC. Feeding tube remains in place as well as ECMO catheter. Complete opacification of bilateral hemi-thoraces again seen, without significant change. IMPRESSION: Complete opacification of bilateral hemi-thoraces, without significant change. Stable support lines and tubes. Electronically Signed   By: Marlaine Hind M.D.   On: 01/29/2020 07:41   DG CHEST PORT 1 VIEW  Result Date: 01/28/2020 CLINICAL DATA:  Severe acute respiratory failure.  COVID-19. EXAM: PORTABLE CHEST 1 VIEW COMPARISON:  None. FINDINGS: An ECMO cannula is again identified. The feeding tube terminates below today's film. The left IJ is stable terminating in the SVC. Complete opacification of the lungs bilaterally obscuring the heart mediastinal contours remains. No significant change. No pneumothorax. A right PICC line probably terminates in the right subclavian vein, unchanged. Orthopedic fixation hardware remains in the lower cervical spine, only partly imaged today. IMPRESSION: 1. Support apparatus as above. 2. Complete opacification of the lungs bilaterally, unchanged  in the interval. Electronically Signed   By: Dorise Bullion III M.D   On: 01/28/2020 08:25    Medications: Infusions: . sodium chloride 250 mL (01/21/20 1227)  . albumin human 12.5 g (01/28/20 1229)  . albumin human    . amiodarone 30 mg/hr (01/29/20 0700)  . ampicillin (OMNIPEN) IV 2 g (01/29/20 0841)  . cefTRIAXone (ROCEPHIN)  IV Stopped (01/28/20 2323)  . dexmedetomidine (PRECEDEX) IV infusion 0.1 mcg/kg/hr (01/29/20 0700)  . feeding supplement (PIVOT 1.5 CAL) 1,000 mL (01/29/20 0335)  . fentaNYL infusion INTRAVENOUS Stopped (01/27/20 0057)  . insulin 5 mL/hr at 01/29/20 0700  . milrinone 0.125 mcg/kg/min (01/29/20 0700)  . norepinephrine (LEVOPHED) Adult infusion 5 mcg/min (01/29/20 0700)  . prismasol BGK 2/2.5 dialysis solution 2,000 mL/hr at 01/29/20 0540  . prismasol BGK 2/2.5 replacement solution 500 mL/hr at 01/28/20 1710  . prismasol BGK 2/2.5 replacement solution 300 mL/hr at 01/28/20 1710  . vasopressin 0.04 Units/min (01/29/20 0700)    Scheduled Medications: . sodium chloride   Intravenous Once  . sodium chloride   Intravenous Once  . vitamin C  500 mg Per Tube Daily  . chlorhexidine gluconate (MEDLINE KIT)  15 mL Mouth Rinse BID  . Chlorhexidine Gluconate Cloth  6 each Topical Daily  . clonazePAM  1 mg Per Tube Q6H  . docusate  100 mg Per Tube BID  . etomidate  40 mg Intravenous Once  . feeding supplement (PROSource TF)  45 mL Per Tube BID  . fentaNYL (SUBLIMAZE) injection  200 mcg Intravenous Once  . fentaNYL (SUBLIMAZE) injection  50 mcg Intravenous Once  . linagliptin  5 mg Per Tube Daily  . mouth rinse  15 mL Mouth Rinse 10 times per day  . midazolam  5 mg Intravenous Once  . nutrition supplement (JUVEN)  1 packet Per Tube BID BM  . oxyCODONE  10 mg Per Tube Q6H  . pantoprazole sodium  40 mg Per Tube Daily  . polyethylene glycol  17 g Per Tube Daily  . QUEtiapine  100 mg Per Tube BID  . sennosides  5 mL Per  Tube BID  . vecuronium  10 mg Intravenous Once   . zinc sulfate  220 mg Per Tube Daily    have reviewed scheduled and prn medications.  Physical Exam: General: Critically ill looking female, sedated, has high flow nasal cannula with ECMO Heart:RRR, s1s2 nl Lungs: Coarse breath sound bilateral Abdomen:soft, non-distended Extremities: Lower extremity edema present Dialysis Access: Temporary HD catheter  Margeaux Swantek Tanna Furry 01/29/2020,8:47 AM  LOS: 21 days  Pager: [phone]

## 2020-01-29 NOTE — Progress Notes (Addendum)
Physical Therapy Treatment Patient Details Name: Alisha Stephenson MRN: [id number] DOB: April 20, 1966 Today's Date: 01/29/2020    History of Present Illness 54 yo admitted 8/2 with hypoxemia due to Covid 19 PNA. 8/3 intubated. 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 CRRT initiated 8/22.  PMHx: DM, diverticulitis, Asthma    PT Comments    Pt lethargic with sedation and only briefly opening eyes during session during mobility but unsure if in regard to stimulation. PROM in available ROM all extremities performed. Pt with addition of CRRT since last session with discoloration of both hands noted. Discussed pt case and participation with RN and will continue to follow but defer further PROM to nursing and seek to have participative sessions with pt to further assess strength and cognition as medical stability allows.   SpO2 84-85% throughout, HFNC 30    Follow Up Recommendations  LTACH     Equipment Recommendations  Other (comment) (TBD)    Recommendations for Other Services       Precautions / Restrictions Precautions Precautions: Other (comment) Precaution Comments: ECMO with RIJ access anchors at right shoulder and chest, necrotic thumb and index finger, bruised/necrotic left hand, CRRT left subclavian    Mobility  Bed Mobility               General bed mobility comments: unable to attempt due to cognition and lines  Transfers                 General transfer comment: unable to attempt  Ambulation/Gait                 Stairs             Wheelchair Mobility    Modified Rankin (Stroke Patients Only)       Balance                                            Cognition Arousal/Alertness: Lethargic;Suspect due to medications Behavior During Therapy: Flat affect Overall Cognitive Status: Impaired/Different from baseline                                 General  Comments: pt with increased medication to maintain lines. Pt did briefly open eyes during leg movement but no visual fixation or command following      Exercises General Exercises - Upper Extremity Shoulder Horizontal ABduction: PROM;Left;Supine;15 reps Elbow Flexion: PROM;15 reps;Both;Supine Elbow Extension: PROM;15 reps;Both;Supine Wrist Flexion: PROM;Both;Supine;15 reps Wrist Extension: PROM;Both;Supine;15 reps Digit Composite Flexion: Both;Supine;15 reps;PROM Composite Extension: Both;15 reps;Supine;PROM General Exercises - Lower Extremity Ankle Circles/Pumps: Both;Supine;PROM;15 reps Heel Slides: PROM;Both;Supine;15 reps Hip ABduction/ADduction: PROM;Both;15 reps;Supine Other Exercises Other Exercises: bil LE hip internal and external rotation x 15 PROM, supine Other Exercises: gentle retrograde massage to bil hands Other Exercises: digit flexion and extension bil feet x 10, supine, PROM    General Comments        Pertinent Vitals/Pain Pain Assessment: No/denies pain    Home Living                      Prior Function            PT Goals (current goals can now be found in the care plan section) Progress towards PT goals: Not progressing  toward goals - comment    Frequency    Min 2X/week      PT Plan Current plan remains appropriate    Co-evaluation              AM-PAC PT "6 Clicks" Mobility   Outcome Measure  Help needed turning from your back to your side while in a flat bed without using bedrails?: Total Help needed moving from lying on your back to sitting on the side of a flat bed without using bedrails?: Total Help needed moving to and from a bed to a chair (including a wheelchair)?: Total Help needed standing up from a chair using your arms (e.g., wheelchair or bedside chair)?: Total Help needed to walk in hospital room?: Total Help needed climbing 3-5 steps with a railing? : Total 6 Click Score: 6    End of Session Equipment  Utilized During Treatment: Oxygen Activity Tolerance: Patient tolerated treatment well Patient left: in bed Nurse Communication: Mobility status;Need for lift equipment PT Visit Diagnosis: Other abnormalities of gait and mobility (R26.89);Muscle weakness (generalized) (M62.81)     Time: 8250-5397 PT Time Calculation (min) (ACUTE ONLY): 17 min  Charges:  $Therapeutic Exercise: 8-22 mins                     Danyl Deems P, PT Acute Rehabilitation Services Pager: [phone] Office: [place] 01/29/2020, 1:07 PM

## 2020-01-30 ENCOUNTER — Inpatient Hospital Stay (HOSPITAL_COMMUNITY): Payer: PRIVATE HEALTH INSURANCE

## 2020-01-30 DIAGNOSIS — B952 Enterococcus as the cause of diseases classified elsewhere: Secondary | ICD-10-CM

## 2020-01-30 DIAGNOSIS — R7881 Bacteremia: Secondary | ICD-10-CM

## 2020-01-30 DIAGNOSIS — R230 Cyanosis: Secondary | ICD-10-CM

## 2020-01-30 DIAGNOSIS — J9809 Other diseases of bronchus, not elsewhere classified: Secondary | ICD-10-CM

## 2020-01-30 DIAGNOSIS — J15211 Pneumonia due to Methicillin susceptible Staphylococcus aureus: Secondary | ICD-10-CM

## 2020-01-30 LAB — BASIC METABOLIC PANEL
Anion gap: 13 (ref 5–15)
Anion gap: 12 (ref 5–15)
BUN: 95 mg/dL — ABNORMAL HIGH (ref 6–20)
BUN: 77 mg/dL — ABNORMAL HIGH (ref 6–20)
CO2: 25 mmol/L (ref 22–32)
CO2: 23 mmol/L (ref 22–32)
Calcium: 8 mg/dL — ABNORMAL LOW (ref 8.9–10.3)
Calcium: 7.5 mg/dL — ABNORMAL LOW (ref 8.9–10.3)
Chloride: 103 mmol/L (ref 98–111)
Chloride: 102 mmol/L (ref 98–111)
Creatinine, Ser: 1.31 mg/dL — ABNORMAL HIGH (ref 0.44–1.00)
Creatinine, Ser: 1.27 mg/dL — ABNORMAL HIGH (ref 0.44–1.00)
GFR calc Af Amer: 53 mL/min — ABNORMAL LOW (ref >60)
GFR calc Af Amer: 55 mL/min — ABNORMAL LOW (ref >60)
GFR calc non Af Amer: 46 mL/min — ABNORMAL LOW (ref >60)
GFR calc non Af Amer: 47 mL/min — ABNORMAL LOW (ref >60)
Glucose, Bld: 164 mg/dL — ABNORMAL HIGH (ref 70–99)
Glucose, Bld: 146 mg/dL — ABNORMAL HIGH (ref 70–99)
Potassium: 4.7 mmol/L (ref 3.5–5.1)
Potassium: 4.5 mmol/L (ref 3.5–5.1)
Sodium: 141 mmol/L (ref 135–145)
Sodium: 137 mmol/L (ref 135–145)

## 2020-01-30 LAB — POCT I-STAT 7, (LYTES, BLD GAS, ICA,H+H)
Acid-Base Excess: 0 mmol/L (ref 0.0–2.0)
Acid-Base Excess: 2 mmol/L (ref 0.0–2.0)
Acid-Base Excess: 3 mmol/L — ABNORMAL HIGH (ref 0.0–2.0)
Acid-Base Excess: 2 mmol/L (ref 0.0–2.0)
Acid-Base Excess: 2 mmol/L (ref 0.0–2.0)
Bicarbonate: 26.3 mmol/L (ref 20.0–28.0)
Bicarbonate: 27 mmol/L (ref 20.0–28.0)
Bicarbonate: 25.7 mmol/L (ref 20.0–28.0)
Bicarbonate: 25.9 mmol/L (ref 20.0–28.0)
Bicarbonate: 26.1 mmol/L (ref 20.0–28.0)
Calcium, Ion: 1.11 mmol/L — ABNORMAL LOW (ref 1.15–1.40)
Calcium, Ion: 1.03 mmol/L — ABNORMAL LOW (ref 1.15–1.40)
Calcium, Ion: 1.01 mmol/L — ABNORMAL LOW (ref 1.15–1.40)
Calcium, Ion: 1.04 mmol/L — ABNORMAL LOW (ref 1.15–1.40)
Calcium, Ion: 1.05 mmol/L — ABNORMAL LOW (ref 1.15–1.40)
HCT: 26 % — ABNORMAL LOW (ref 36.0–46.0)
HCT: 24 % — ABNORMAL LOW (ref 36.0–46.0)
HCT: 21 % — ABNORMAL LOW (ref 36.0–46.0)
HCT: 23 % — ABNORMAL LOW (ref 36.0–46.0)
HCT: 23 % — ABNORMAL LOW (ref 36.0–46.0)
Hemoglobin: 8.8 g/dL — ABNORMAL LOW (ref 12.0–15.0)
Hemoglobin: 8.2 g/dL — ABNORMAL LOW (ref 12.0–15.0)
Hemoglobin: 7.1 g/dL — ABNORMAL LOW (ref 12.0–15.0)
Hemoglobin: 7.8 g/dL — ABNORMAL LOW (ref 12.0–15.0)
Hemoglobin: 7.8 g/dL — ABNORMAL LOW (ref 12.0–15.0)
O2 Saturation: 77 %
O2 Saturation: 96 %
O2 Saturation: 99 %
O2 Saturation: 99 %
O2 Saturation: 99 %
Patient temperature: 35.6
Patient temperature: 36.1
Patient temperature: 34.6
Patient temperature: 34.5
Patient temperature: 34.8
Potassium: 4.6 mmol/L (ref 3.5–5.1)
Potassium: 5.6 mmol/L — ABNORMAL HIGH (ref 3.5–5.1)
Potassium: 4.4 mmol/L (ref 3.5–5.1)
Potassium: 4.5 mmol/L (ref 3.5–5.1)
Potassium: 4.2 mmol/L (ref 3.5–5.1)
Sodium: 137 mmol/L (ref 135–145)
Sodium: 136 mmol/L (ref 135–145)
Sodium: 137 mmol/L (ref 135–145)
Sodium: 137 mmol/L (ref 135–145)
Sodium: 137 mmol/L (ref 135–145)
TCO2: 28 mmol/L (ref 22–32)
TCO2: 28 mmol/L (ref 22–32)
TCO2: 27 mmol/L (ref 22–32)
TCO2: 27 mmol/L (ref 22–32)
TCO2: 27 mmol/L (ref 22–32)
pCO2 arterial: 45.8 mmHg (ref 32.0–48.0)
pCO2 arterial: 42.9 mmHg (ref 32.0–48.0)
pCO2 arterial: 27.8 mmHg — ABNORMAL LOW (ref 32.0–48.0)
pCO2 arterial: 30.6 mmHg — ABNORMAL LOW (ref 32.0–48.0)
pCO2 arterial: 36.1 mmHg (ref 32.0–48.0)
pH, Arterial: 7.361 (ref 7.350–7.450)
pH, Arterial: 7.404 (ref 7.350–7.450)
pH, Arterial: 7.564 — ABNORMAL HIGH (ref 7.350–7.450)
pH, Arterial: 7.526 — ABNORMAL HIGH (ref 7.350–7.450)
pH, Arterial: 7.458 — ABNORMAL HIGH (ref 7.350–7.450)
pO2, Arterial: 40 mmHg — CL (ref 83.0–108.0)
pO2, Arterial: 79 mmHg — ABNORMAL LOW (ref 83.0–108.0)
pO2, Arterial: 87 mmHg (ref 83.0–108.0)
pO2, Arterial: 109 mmHg — ABNORMAL HIGH (ref 83.0–108.0)
pO2, Arterial: 118 mmHg — ABNORMAL HIGH (ref 83.0–108.0)

## 2020-01-30 LAB — HEPATIC FUNCTION PANEL
ALT: 69 U/L — ABNORMAL HIGH (ref 0–44)
AST: 123 U/L — ABNORMAL HIGH (ref 15–41)
Albumin: 2.9 g/dL — ABNORMAL LOW (ref 3.5–5.0)
Alkaline Phosphatase: 139 U/L — ABNORMAL HIGH (ref 38–126)
Bilirubin, Direct: 0.4 mg/dL — ABNORMAL HIGH (ref 0.0–0.2)
Indirect Bilirubin: 1.4 mg/dL — ABNORMAL HIGH (ref 0.3–0.9)
Total Bilirubin: 1.8 mg/dL — ABNORMAL HIGH (ref 0.3–1.2)
Total Protein: 5.3 g/dL — ABNORMAL LOW (ref 6.5–8.1)

## 2020-01-30 LAB — PREPARE RBC (CROSSMATCH)

## 2020-01-30 LAB — GLUCOSE, CAPILLARY
Glucose-Capillary: 191 mg/dL — ABNORMAL HIGH (ref 70–99)
Glucose-Capillary: 147 mg/dL — ABNORMAL HIGH (ref 70–99)
Glucose-Capillary: 162 mg/dL — ABNORMAL HIGH (ref 70–99)
Glucose-Capillary: 162 mg/dL — ABNORMAL HIGH (ref 70–99)
Glucose-Capillary: 239 mg/dL — ABNORMAL HIGH (ref 70–99)
Glucose-Capillary: 188 mg/dL — ABNORMAL HIGH (ref 70–99)
Glucose-Capillary: 201 mg/dL — ABNORMAL HIGH (ref 70–99)
Glucose-Capillary: 199 mg/dL — ABNORMAL HIGH (ref 70–99)
Glucose-Capillary: 150 mg/dL — ABNORMAL HIGH (ref 70–99)
Glucose-Capillary: 150 mg/dL — ABNORMAL HIGH (ref 70–99)
Glucose-Capillary: 179 mg/dL — ABNORMAL HIGH (ref 70–99)
Glucose-Capillary: 199 mg/dL — ABNORMAL HIGH (ref 70–99)
Glucose-Capillary: 139 mg/dL — ABNORMAL HIGH (ref 70–99)

## 2020-01-30 LAB — CBC
HCT: 27.3 % — ABNORMAL LOW (ref 36.0–46.0)
HCT: 23.7 % — ABNORMAL LOW (ref 36.0–46.0)
Hemoglobin: 8.5 g/dL — ABNORMAL LOW (ref 12.0–15.0)
Hemoglobin: 7.5 g/dL — ABNORMAL LOW (ref 12.0–15.0)
MCH: 31.5 pg (ref 26.0–34.0)
MCH: 31.3 pg (ref 26.0–34.0)
MCHC: 31.1 g/dL (ref 30.0–36.0)
MCHC: 31.6 g/dL (ref 30.0–36.0)
MCV: 101.1 fL — ABNORMAL HIGH (ref 80.0–100.0)
MCV: 98.8 fL (ref 80.0–100.0)
Platelets: 30 10*3/uL — ABNORMAL LOW (ref 150–400)
Platelets: 30 10*3/uL — ABNORMAL LOW (ref 150–400)
RBC: 2.7 MIL/uL — ABNORMAL LOW (ref 3.87–5.11)
RBC: 2.4 MIL/uL — ABNORMAL LOW (ref 3.87–5.11)
RDW: 22.4 % — ABNORMAL HIGH (ref 11.5–15.5)
RDW: 22.1 % — ABNORMAL HIGH (ref 11.5–15.5)
WBC: 11.3 10*3/uL — ABNORMAL HIGH (ref 4.0–10.5)
WBC: 12.6 10*3/uL — ABNORMAL HIGH (ref 4.0–10.5)
nRBC: 8.7 % — ABNORMAL HIGH (ref 0.0–0.2)
nRBC: 8 % — ABNORMAL HIGH (ref 0.0–0.2)

## 2020-01-30 LAB — CULTURE, BLOOD (ROUTINE X 2)
Culture: NO GROWTH
Culture: NO GROWTH
Special Requests: ADEQUATE
Special Requests: ADEQUATE

## 2020-01-30 LAB — LACTIC ACID, PLASMA
Lactic Acid, Venous: 1.7 mmol/L (ref 0.5–1.9)
Lactic Acid, Venous: 1.6 mmol/L (ref 0.5–1.9)

## 2020-01-30 LAB — PROTIME-INR
INR: 1.6 — ABNORMAL HIGH (ref 0.8–1.2)
Prothrombin Time: 18.5 seconds — ABNORMAL HIGH (ref 11.4–15.2)

## 2020-01-30 LAB — BPAM PLATELET PHERESIS
Blood Product Expiration Date: 202108232359
ISSUE DATE / TIME: 202108231446
Unit Type and Rh: 6200

## 2020-01-30 LAB — APTT
aPTT: 41 seconds — ABNORMAL HIGH (ref 24–36)
aPTT: 47 seconds — ABNORMAL HIGH (ref 24–36)

## 2020-01-30 LAB — PREPARE PLATELET PHERESIS: Unit division: 0

## 2020-01-30 LAB — FIBRINOGEN: Fibrinogen: 90 mg/dL — CL (ref 210–475)

## 2020-01-30 LAB — PHOSPHORUS: Phosphorus: 6 mg/dL — ABNORMAL HIGH (ref 2.5–4.6)

## 2020-01-30 LAB — MAGNESIUM: Magnesium: 2.9 mg/dL — ABNORMAL HIGH (ref 1.7–2.4)

## 2020-01-30 LAB — SAVE SMEAR(SSMR), FOR PROVIDER SLIDE REVIEW

## 2020-01-30 LAB — LACTATE DEHYDROGENASE: LDH: 917 U/L — ABNORMAL HIGH (ref 98–192)

## 2020-01-30 LAB — IMMATURE PLATELET FRACTION: Immature Platelet Fraction: 21.5 % — ABNORMAL HIGH (ref 1.2–8.6)

## 2020-01-30 MED ORDER — FENTANYL CITRATE (PF) 100 MCG/2ML IJ SOLN
50.0000 ug | Freq: Once | INTRAMUSCULAR | Status: DC
  Filled 2020-01-30: qty 2

## 2020-01-30 MED ORDER — MIDAZOLAM HCL 2 MG/2ML IJ SOLN
2.0000 mg | Freq: Once | INTRAMUSCULAR | Status: AC
  Administered 2020-01-30: 2 mg via INTRAVENOUS
  Filled 2020-01-30: qty 2

## 2020-01-30 MED ORDER — PANTOPRAZOLE SODIUM 40 MG IV SOLR
40.0000 mg | Freq: Every day | INTRAVENOUS | Status: DC
  Administered 2020-01-30 – 2020-02-09 (×11): 40 mg via INTRAVENOUS
  Filled 2020-01-30 (×11): qty 40

## 2020-01-30 MED ORDER — SODIUM CHLORIDE 0.9% IV SOLUTION: Freq: Once | INTRAVENOUS | Status: AC

## 2020-01-30 MED ORDER — FENTANYL CITRATE (PF) 100 MCG/2ML IJ SOLN: 50.0000 ug | INTRAMUSCULAR | Status: DC | PRN

## 2020-01-30 MED ORDER — FENTANYL 2500MCG IN NS 250ML (10MCG/ML) PREMIX INFUSION
0.0000 ug/h | INTRAVENOUS | Status: DC
  Administered 2020-01-30: 50 ug/h via INTRAVENOUS
  Administered 2020-01-31: 350 ug/h via INTRAVENOUS
  Administered 2020-01-31: 325 ug/h via INTRAVENOUS
  Administered 2020-01-31: 200 ug/h via INTRAVENOUS
  Administered 2020-02-01: 350 ug/h via INTRAVENOUS
  Filled 2020-01-30 (×5): qty 250

## 2020-01-30 MED ORDER — HEPARIN SODIUM (PORCINE) 1000 UNIT/ML IJ SOLN
2500.0000 [IU] | Freq: Once | INTRAMUSCULAR | Status: AC
  Administered 2020-01-30: 2500 [IU] via INTRAVENOUS
  Filled 2020-01-30: qty 3

## 2020-01-30 MED ORDER — FENTANYL BOLUS VIA INFUSION
50.0000 ug | INTRAVENOUS | Status: DC | PRN
  Administered 2020-01-31: 50 ug via INTRAVENOUS
  Filled 2020-01-30: qty 50

## 2020-01-30 MED ORDER — ROCURONIUM BROMIDE 10 MG/ML (PF) SYRINGE
150.0000 mg | PREFILLED_SYRINGE | Freq: Once | INTRAVENOUS | Status: AC
  Administered 2020-01-30: 150 mg via INTRAVENOUS
  Filled 2020-01-30: qty 20

## 2020-01-30 MED ORDER — FENTANYL CITRATE (PF) 100 MCG/2ML IJ SOLN
50.0000 ug | INTRAMUSCULAR | Status: DC | PRN
  Administered 2020-02-05: 50 ug via INTRAVENOUS
  Administered 2020-02-07 – 2020-02-17 (×5): 100 ug via INTRAVENOUS
  Administered 2020-02-24: 50 ug via INTRAVENOUS
  Administered 2020-02-24 – 2020-02-25 (×2): 100 ug via INTRAVENOUS
  Administered 2020-02-25: 50 ug via INTRAVENOUS
  Administered 2020-02-26: 200 ug via INTRAVENOUS
  Administered 2020-02-26 – 2020-02-28 (×7): 100 ug via INTRAVENOUS
  Administered 2020-02-28: 200 ug via INTRAVENOUS
  Administered 2020-02-28 – 2020-02-29 (×4): 100 ug via INTRAVENOUS
  Administered 2020-02-29: 50 ug via INTRAVENOUS
  Administered 2020-03-01: 100 ug via INTRAVENOUS
  Administered 2020-03-02 – 2020-03-03 (×6): 50 ug via INTRAVENOUS
  Filled 2020-01-30 (×8): qty 2
  Filled 2020-01-30: qty 4
  Filled 2020-01-30: qty 2
  Filled 2020-01-30: qty 4
  Filled 2020-01-30: qty 2
  Filled 2020-01-30: qty 4
  Filled 2020-01-30 (×14): qty 2
  Filled 2020-01-30: qty 4
  Filled 2020-01-30: qty 2
  Filled 2020-01-30: qty 4
  Filled 2020-01-30: qty 2

## 2020-01-30 MED ORDER — PROSOURCE TF PO LIQD
45.0000 mL | Freq: Three times a day (TID) | ORAL | Status: DC
  Administered 2020-01-30 – 2020-02-14 (×45): 45 mL
  Filled 2020-01-30 (×45): qty 45

## 2020-01-30 MED ORDER — HEPARIN (PORCINE) 25000 UT/250ML-% IV SOLN
500.0000 [IU]/h | INTRAVENOUS | Status: DC
  Administered 2020-01-30 – 2020-02-03 (×3): 400 [IU]/h via INTRAVENOUS
  Filled 2020-01-30 (×3): qty 250

## 2020-01-30 MED ORDER — B COMPLEX-C PO TABS
1.0000 | ORAL_TABLET | Freq: Every day | ORAL | Status: DC
  Administered 2020-01-30 – 2020-02-14 (×16): 1 via ORAL
  Filled 2020-01-30 (×17): qty 1

## 2020-01-30 MED ORDER — EPINEPHRINE 1 MG/10ML IJ SOSY
PREFILLED_SYRINGE | INTRAMUSCULAR | Status: AC
  Filled 2020-01-30: qty 10

## 2020-01-30 NOTE — Progress Notes (Signed)
Assisted tele visit to patient with family member.  Rilen Shukla McEachran, RN  

## 2020-01-30 NOTE — Progress Notes (Signed)
ANTICOAGULATION CONSULT NOTE - Initial Consult  Pharmacy Consult for Heparin Indication: ECMO  Allergies  Allergen Reactions  . Sulfa Antibiotics Rash and Other (See Comments)    Patient Measurements: Height: 5\' 4"  (162.6 cm) Weight: (!) 155.2 kg (342 lb 2.5 oz) IBW/kg (Calculated) : 54.7 Heparin Dosing Weight: 87 kg  Vital Signs: Temp: 97.3 F (36.3 C) (08/24 1400) BP: 101/35 (08/24 1538) Pulse Rate: 78 (08/24 1538)  Labs: Recent Labs    01/28/20 0308 01/28/20 0528 01/29/20 0451 01/29/20 7867 01/29/20 1722 01/29/20 1749 01/29/20 2040 01/29/20 2124 01/30/20 0324 01/30/20 0324 01/30/20 0538 01/30/20 1039  HGB 8.8*   < > 8.6*   < > 8.4*   < >   < >  --  8.5*   < > 8.8* 8.2*  HCT 28.9*   < > 28.6*   < > 28.0*   < >   < >  --  27.3*  --  26.0* 24.0*  PLT 35*   < > 29*   < > PLATELET CLUMPS NOTED ON SMEAR, UNABLE TO ESTIMATE  --   --  31* 30*  --   --   --   APTT 41*   < > 45*  --  43*  --   --   --  41*  --   --   --   LABPROT 22.0*  --  21.6*  --   --   --   --   --  18.5*  --   --   --   INR 2.0*  --  2.0*  --   --   --   --   --  1.6*  --   --   --   CREATININE 1.87*   < > 1.45*  --  1.26*  --   --   --  1.31*  --   --   --    < > = values in this interval not displayed.    Estimated Creatinine Clearance: 72.7 mL/min (A) (by C-G formula based on SCr of 1.31 mg/dL (H)).   Medical History: Past Medical History:  Diagnosis Date  . Asthma   . Diabetes mellitus without complication ([place])   . Diverticulitis   . Gallstones   . IBS (irritable bowel syndrome)   . NAFLD (nonalcoholic fatty liver disease)    CT scan 2015  . Ovarian cyst     Assessment: 54 yo female who is COVID+ on VV ECMO. Patient was started on bivalirudin but was found to have multiple ICH on 8/13 head CT. Bivalirudin was stopped at this time and pt has been off anticoagulation. Her ECMO circuit was changed today, and pharmacy asked to start low-fixed rate heparin.  She received 2500 unit  heparin bolus at 1600 prior to circuit change. Will hold for 3 hours and start fixed rate heparin at 400 units/hr. Will check daily heparin levels but not titrate to goal for now given recent ICH per discussion with ECMO team. Platelets consistently low at 30 this AM. Hgb 8.5. LDH rising at 917, fibrinogen 90.  Goal of Therapy:  Heparin level 0.2-0.5 units/ml - currently not titrating to goal per discussion with ECMO team Monitor platelets by anticoagulation protocol: Yes   Plan:  Start heparin 400 units/hr at 1900 tonight Check daily HL Monitor daily CBC, signs of bleeding, and ECMO circuit  Richardine Service, PharmD PGY2 Cardiology Pharmacy Resident Phone: [phone] 01/30/2020  4:29 PM  Please check [email or web address] for unit-specific pharmacy  phone numbers.

## 2020-01-30 NOTE — Progress Notes (Signed)
During morning multidisciplinary rounds, the decision was made to change out the ECMO circuit.  A new circuit was set up and primed using sterile technique, and brought to the patient bedside by myself, Saintclair Halsted. [place], CCP, LP and [place], [place], [place].  The following staff per present for the procedure: Penny Pia, RN, ECMO Specialist, Idelia Salm, RN, ECMO Specialist, Eloise Harman, RN, and Scheryl Darter, RN, Dr. Ernest Mallick, Dr. Haroldine Laws, the respiratory therapist, and pharamacy team were also present.   The field was prepped and draped, a timeout was performed, and the team completed a briefing prior to the start of the procedure. 2,500 units of Heparin were given per pharmacy.  The ECMO circuit was clamped out at 1607, cut and reconnected to the new circuit via a wet to wet connection.  Flow was reinitiated at 1607 and 4200 rpms were resumed for a flow of 5 LPM.  The patient had temporary episode of desaturation, but quickly recovered and the procedure was overall uneventful.   Disposable Lot Numbers:  Centrimag Blood Pump-  Lot: [id number]  Exp: 04/26/2022  Quadrox Oxygenator-  Lot: [id number]  Exp: 02/18/2021  Tubing Pack-  Lot: [id number]  Exp: 02/05/2021

## 2020-01-30 NOTE — Progress Notes (Addendum)
Nutrition Follow Up  DOCUMENTATION CODES:   Morbid obesity  INTERVENTION:   Continue tube feeding:  -Pivot 1.5 @ 75 ml/hr (1800 ml) via Cortrak -45 ml Prosource TID  Provides: 2820 kcals, 202 grams protein, 1350 ml free water. Meets 100% needs.   Add B complex with Vitamin C to account for losses with CRRT   NUTRITION DIAGNOSIS:   Increased nutrient needs related to acute illness (COVID-19 infection) as evidenced by estimated needs.  Ongoing  GOAL:   Patient will meet greater than or equal to 90% of their needs  Addressed via TF  MONITOR:   Vent status, TF tolerance, Labs, Weight trends  REASON FOR ASSESSMENT:   Ventilator, Consult Enteral/tube feeding initiation and management  ASSESSMENT:   54 y.o. female with medical history of type 2 DM. She presented to the ED at [facility] with SOB diarrhea x1 week, and cough. She was dx with COVID-19 one week prior to presentation to the ED.   8/9- tx from [facility], s/p VV ECMO cannulation 8/13- CT head with multiple areas of [place] 8/16- extubated, gastric Cortrak placed  8/22- start CRRT  Pt discussed during ICU rounds and with RN.   Reintubated this am for circuit change, may require trach. Kept even on CRRT. Given 1 unit of platelets with no improvement. Remains on pressors. TF held for intubation, restart after circuit change. Free water flushes d/c per CCM.   CBGs continue to be elevated despite EndoTool. Last readings show 207, 212, 235, 239, 164, 150.   Admission weight: 133.6 kg Current weight: 155.2 kg (up 5 kg over the last 4 days)  Patient is currently intubated on ventilator support MV: 0.8 L/min Temp (24hrs), Avg:96.3 F (35.7 C), Min:95.5 F (35.3 C), Max:97.3 F (36.3 C)   I/O: +13,938 ml since 8/10  UOP: 90 ml x 24 hrs  Stool: 600 ml x 24 hrs CRRT: 4,503 ml x 24 hrs   Drips: precedex, insulin, milrinone, levophed, vasopressin  Medications: 500 mg Vitamin C, colace, tradjenta, miralax,  senokot, zinc sulfate Labs: Phosphorus 6.0 (H) Mg 2.9 (H) BUN 95-trending down Cr 1.31- up from yesterday   Diet Order:   Diet Order            Diet NPO time specified  Diet effective midnight                 EDUCATION NEEDS:   No education needs have been identified at this time  Skin:  Skin Assessment: Skin Integrity Issues: Skin Integrity Issues:: Other (Comment) Other: MASD-breast skin tears-face/chest/chin  Last BM:  8/23 via rectal tube  Height:   Ht Readings from Last 1 Encounters:  01/27/2020 5\' 4"  (1.626 m)    Weight:   Wt Readings from Last 1 Encounters:  01/30/20 (!) 155.2 kg    Ideal Body Weight:  54.5 kg Adj Body Weight: 84.7 kg   BMI:  Body mass index is 58.73 kg/m.  Estimated Nutritional Needs:   Kcal:  8916-9450 kcal  Protein:  170-210 grams  Fluid:  >/= 2 L/day  Mariana Single RD, LDN Clinical Nutrition Pager listed in [place]

## 2020-01-30 NOTE — Progress Notes (Signed)
[facility] for Infectious Disease    Date of Admission:  01/17/2020   Total days of antibiotics 12           ID: Alisha Stephenson is a 54 y.o. female with   Principal Problem:   Acute respiratory distress syndrome (ARDS) due to COVID-19 virus ([place]) Active Problems:   NAFLD (nonalcoholic fatty liver disease)   Diabetes mellitus type 1.5, managed as type 1 (HCC)   Pneumonia due to COVID-19 virus   Intracerebral hemorrhage   Personal history of ECMO   Palliative care by specialist   Goals of care, counseling/discussion    Subjective: Did not appropriately respond to platelet transfusion. reintubated this morning for exchange of circuit. Still oozing blood from line sites  Medications:  . sodium chloride   Intravenous Once  . sodium chloride   Intravenous Once  . vitamin C  500 mg Per Tube Daily  . B-complex with vitamin C  1 tablet Oral Daily  . chlorhexidine gluconate (MEDLINE KIT)  15 mL Mouth Rinse BID  . Chlorhexidine Gluconate Cloth  6 each Topical Daily  . clonazePAM  1 mg Per Tube Q6H  . docusate  100 mg Per Tube BID  . EPINEPHrine      . feeding supplement (PROSource TF)  45 mL Per Tube TID  . fentaNYL (SUBLIMAZE) injection  50 mcg Intravenous Once  . linagliptin  5 mg Per Tube Daily  . mouth rinse  15 mL Mouth Rinse 10 times per day  . oxyCODONE  10 mg Per Tube Q6H  . pantoprazole (PROTONIX) IV  40 mg Intravenous Daily  . polyethylene glycol  17 g Per Tube Daily  . sennosides  5 mL Per Tube BID  . vecuronium  10 mg Intravenous Once  . zinc sulfate  220 mg Per Tube Daily    Objective: Vital signs in last 24 hours: Temp:  [95.2 F (35.1 C)-97.3 F (36.3 C)] 95.2 F (35.1 C) (08/24 1700) Pulse Rate:  [74-93] 75 (08/24 1700) Resp:  [15-54] 17 (08/24 1538) BP: (88-150)/(28-106) 95/40 (08/24 1600) SpO2:  [79 %-98 %] 98 % (08/24 1700) Arterial Line BP: (73-170)/(46-81) 123/60 (08/24 1700) FiO2 (%):  [100 %] 100 % (08/24 1538) Weight:  [155.2 kg] 155.2 kg  (08/24 0500) Physical Exam  Constitutional:  sedated. appears well-developed and well-nourished. No distress.  HENT: [place]/AT, PERRLA, no scleral icterus Mouth/Throat: OETT with blood tinged secretions No oropharyngeal exudate.  Neck: ecmo cannula on right side Cardiovascular: Normal rate, regular rhythm and normal heart sounds. Exam reveals no gallop and no friction rub.  No murmur heard.  Pulmonary/Chest: Effort normal and breath sounds normal. No respiratory distress.  has no wheezes.  Abdominal: Soft. Bowel sounds are normal.  exhibits no distension. There is no tenderness.  Ext: marked anasarca   Lab Results Recent Labs    01/29/20 1722 01/29/20 1749 01/30/20 0324 01/30/20 0324 01/30/20 0538 01/30/20 1039  WBC 11.9*  --  11.3*  --   --   --   HGB 8.4*   < > 8.5*   < > 8.8* 8.2*  HCT 28.0*   < > 27.3*   < > 26.0* 24.0*  NA 140   < > 141   < > 137 136  K 4.3   < > 4.7   < > 4.6 5.6*  CL 102  --  103  --   --   --   CO2 25  --  25  --   --   --  BUN 113*  --  95*  --   --   --   CREATININE 1.26*  --  1.31*  --   --   --    < > = values in this interval not displayed.   Liver Panel Recent Labs    01/29/20 0451 01/30/20 0324  PROT 5.4* 5.3*  ALBUMIN 3.2* 2.9*  AST 65* 123*  ALT 59* 69*  ALKPHOS 137* 139*  BILITOT 1.5* 1.8*  BILIDIR 0.3* 0.4*  IBILI 1.2* 1.4*   Sedimentation Rate No results for input(s): ESRSEDRATE in the last 72 hours. C-Reactive Protein No results for input(s): CRP in the last 72 hours.  Microbiology: 8/19 blood cx NGTD Studies/Results: DG CHEST PORT 1 VIEW  Result Date: 01/30/2020 CLINICAL DATA:  ECMO.  ARDS due to COVID 19 virus. EXAM: PORTABLE CHEST 1 VIEW COMPARISON:  One-view chest x-ray 01/30/2019 FINDINGS: Heart is obscured by airspace disease. The tip of the ECMO catheter is at the level of T12, the inferior cavoatrial junction. Left IJ line, endotracheal tube, and enteric tube are stable. The lungs are diffusely opacified bilaterally.  Air bronchograms are noted. IMPRESSION: 1. Stable appearance of diffuse bilateral interstitial and airspace disease, compatible ARDS/infection. 2. The tip of the ECMO catheter is at the level of T12, the inferior cavoatrial junction. Electronically Signed   By: San Morelle M.D.   On: 01/30/2020 14:09   DG CHEST PORT 1 VIEW  Result Date: 01/30/2020 CLINICAL DATA:  Respiratory failure EXAM: PORTABLE CHEST 1 VIEW COMPARISON:  January 29, 2020. FINDINGS: ECMO catheter noted on the right with tip below the diaphragm. Left jugular catheter tip is in the superior vena cava, stable. Left subclavian catheter tip in the left subclavian vein near the junction with the left innominate vein, stable. Feeding tube tip is below the diaphragm. No pneumothorax. There is diffuse airspace opacity bilaterally, stable. There may be superimposed pleural effusions. Cardiac border difficult to discern given the diffuse airspace opacities. No gross change in cardiac silhouette. IMPRESSION: Tube and catheter positions as described without pneumothorax. Widespread airspace opacity bilaterally with potential superimposed effusions. Appearance essentially stable compared to 1 day prior. Electronically Signed   By: Lowella Grip III M.D.   On: 01/30/2020 08:16   DG CHEST PORT 1 VIEW  Result Date: 01/29/2020 CLINICAL DATA:  COVID 19 virus infection. Acute respiratory failure. On ECMO. EXAM: PORTABLE CHEST 1 VIEW COMPARISON:  01/28/2020 FINDINGS: Left subclavian central venous catheter tip is again seen overlying the distal left subclavian vein. Left jugular central venous catheter tip remains in the proximal SVC. Feeding tube remains in place as well as ECMO catheter. Complete opacification of bilateral hemi-thoraces again seen, without significant change. IMPRESSION: Complete opacification of bilateral hemi-thoraces, without significant change. Stable support lines and tubes. Electronically Signed   By: Marlaine Hind M.D.   On:  01/29/2020 07:41   DG Abd Portable 1V  Result Date: 01/30/2020 CLINICAL DATA:  Feeding tube placement. EXAM: PORTABLE ABDOMEN - 1 VIEW COMPARISON:  Plain film of the abdomen dated 02/02/2020. FINDINGS: Weighted tip feeding tube appears appropriately positioned in the stomach with tip directed towards the stomach pylorus/duodenal bulb. ECMO cannula in place. IMPRESSION: Weighted tip feeding tube appears appropriately positioned in the stomach with tip directed towards the stomach pylorus/duodenal bulb. Electronically Signed   By: Franki Cabot M.D.   On: 01/30/2020 11:31   VAS [place] UPPER EXTREMITY ARTERIAL DUPLEX  Result Date: 01/29/2020 UPPER EXTREMITY DUPLEX STUDY Indications: Patient complains of discoloration of  three digits on the right              hand. History:     Patient has a history ofCovid positive - acute hypoxic respiratory              failure requiring ECMO.  Performing Technologist: Oda Cogan RDMS, RVT  Examination Guidelines: A complete evaluation includes B-mode imaging, spectral Doppler, color Doppler, and power Doppler as needed of all accessible portions of each vessel. Bilateral testing is considered an integral part of a complete examination. Limited examinations for reoccurring indications may be performed as noted.  Right Doppler Findings: +---------------+----------+---------+--------+--------+ Site           PSV (cm/s)Waveform StenosisComments +---------------+----------+---------+--------+--------+ Subclavian Dist                                    +---------------+----------+---------+--------+--------+ Brachial Dist  103       triphasic                 +---------------+----------+---------+--------+--------+ Radial Prox    53        triphasic                 +---------------+----------+---------+--------+--------+ Radial Mid     55        triphasic                 +---------------+----------+---------+--------+--------+ Radial Dist    72         triphasic                 +---------------+----------+---------+--------+--------+ Ulnar Prox     75        triphasic                 +---------------+----------+---------+--------+--------+ Ulnar Mid      57        triphasic                 +---------------+----------+---------+--------+--------+ Ulnar Dist     83        biphasic                  +---------------+----------+---------+--------+--------+ Palmar Arch              Patent                    +---------------+----------+---------+--------+--------+   Summary:  Right: Patient right upper extremity arterial system and right        Palmar Arch. *See table(s) above for measurements and observations. Electronically signed by Ruta Hinds MD on 01/29/2020 at 5:11:31 PM.    Final      Assessment/Plan: Enterococcal bacteremia = currently on piptazo  Thrombocytopenia = unclear cause, but likely multifactorial. Agree with plan to exchange circuit to see if it cause for sequestration and see if improvement after this has been exchanged. Family aware of high risk procedure  ICH = continue to monitor neuro function.  [facility] for Infectious Diseases Cell: [phone] Pager: [phone]  01/30/2020, 6:03 PM

## 2020-01-30 NOTE — Progress Notes (Signed)
[facility] NEPHROLOGY PROGRESS NOTE  Assessment/ Plan: Pt is a 54 y.o. yo female  With PMH of DM2, obesity, NAFLD who present w/ acute hypoxic respiratory failure due to Covid.  Hospitalization complicated by severe shock, refractory hypoxia requiring ECMO, multiple infections, intracranial hemorrhage, encephalopathy, AKI.  # Non-Oliguric AKI due to shock, sepsis causing acute tubular necrosis: Now oliguric. Started CRRT on 8/22 for uremia and volume overload.  She has ECMO. Remains nonoliguric however had significant uremia with BUN around 200 and volume overload.  She has ECMO.  Started CRRT on 8/22.  CRRT running well, all 2K bath with normal serum potassium level.  No problem with filters.  UF as tolerated by BP.  Avoiding heparin products given intracranial hemorrhage Dose medication per CRRT.,  Pharmacist following Maintain MAP>65 for optimal renal perfusion.  Foley catheter for accurate ins and outs.  #Shock due to sepsis, Covid infection, Enterococcus bacteremia: On pressors, antibiotics per primary team.  #Acute hypoxic and hypercapnic respiratory failure, ARDS due to COVID-19 pneumonia: Currently on ECMO for refractory hypoxemia, MSSA pneumonia.  On broad-spectrum antibiotics.   #Hypernatremia, hypervolemia: relative free water deficit.  Improved with CRRT.  #Acute toxic metabolic encephalopathy: Multifactorial with uremia contributing.  Treatment with CRRT as above.  #Anemia, thrombocytopenia: With hemolysis likely and acute illness contributing.  Transfusions per primary team.  Hematology is following.  #Intracranial hemorrhage: With some left-sided weakness preceded by primary team.  Avoiding heparin products as able.  Discussed with the cardiology team.  Subjective: Seen and examined.  CRRT and ECMO running.  Patient is sedated.  Review of system unable to obtain.  Urine output only 90 cc recorded.   Objective Vital signs in last 24 hours: Vitals:    01/30/20 0645 01/30/20 0700 01/30/20 0725 01/30/20 0800  BP: (!) 111/47 (!) 108/44 117/81 (!) 108/44  Pulse: 84 83 87 88  Resp: (!) 21 (!) 31 (!) 29 (!) 25  Temp: (!) 96.3 F (35.7 C) (!) 96.4 F (35.8 C) (!) 96.4 F (35.8 C) (!) 96.6 F (35.9 C)  TempSrc:      SpO2: (!) 82% (!) 83% (!) 81% (!) 81%  Weight:      Height:       Weight change: -1.3 kg  Intake/Output Summary (Last 24 hours) at 01/30/2020 0848 Last data filed at 01/30/2020 0800 Gross per 24 hour  Intake 4711.92 ml  Output 5252 ml  Net -540.08 ml       Labs: Basic Metabolic Panel: Recent Labs  Lab 01/29/20 0451 01/29/20 0637 01/29/20 1722 01/29/20 1749 01/29/20 2040 01/30/20 0324 01/30/20 0538  NA 147*   < > 140   < > 139 141 137  K 4.8   < > 4.3   < > 4.4 4.7 4.6  CL 109  --  102  --   --  103  --   CO2 25  --  25  --   --  25  --   GLUCOSE 211*  --  235*  --   --  164*  --   BUN 151*  --  113*  --   --  95*  --   CREATININE 1.45*  --  1.26*  --   --  1.31*  --   CALCIUM 8.6*  --  8.2*  --   --  8.0*  --   PHOS 5.0*  --   --   --   --  6.0*  --    < > =  values in this interval not displayed.   Liver Function Tests: Recent Labs  Lab 01/28/20 0308 01/29/20 0451 01/30/20 0324  AST 35 65* 123*  ALT 61* 59* 69*  ALKPHOS 140* 137* 139*  BILITOT 1.4* 1.5* 1.8*  PROT 5.3* 5.4* 5.3*  ALBUMIN 2.8* 3.2* 2.9*   No results for input(s): LIPASE, AMYLASE in the last 168 hours. No results for input(s): AMMONIA in the last 168 hours. CBC: Recent Labs  Lab 01/28/20 1559 01/28/20 1619 01/28/20 2354 01/28/20 2354 01/29/20 0451 01/29/20 4196 01/29/20 1722 01/29/20 1749 01/29/20 2040 01/29/20 2124 01/30/20 0324 01/30/20 0538  WBC 9.9   < > 7.9   < > 9.3  --  11.9*  --   --   --  11.3*  --   HGB 7.7*   < > 8.5*   < > 8.6*   < > 8.4*   < > 8.5*  --  8.5* 8.8*  HCT 26.3*   < > 28.1*   < > 28.6*   < > 28.0*   < > 25.0*  --  27.3* 26.0*  MCV 103.5*  --  102.6*  --  101.8*  --  101.8*  --   --   --   101.1*  --   PLT 30*   < > 29*   < > 29*   < > PLATELET CLUMPS NOTED ON SMEAR, UNABLE TO ESTIMATE  --   --  31* 30*  --    < > = values in this interval not displayed.   Cardiac Enzymes: No results for input(s): CKTOTAL, CKMB, CKMBINDEX, TROPONINI in the last 168 hours. CBG: Recent Labs  Lab 01/30/20 0213 01/30/20 0321 01/30/20 0439 01/30/20 0624 01/30/20 0739  GLUCAP 147* 162* 162* 239* 188*    Iron Studies: No results for input(s): IRON, TIBC, TRANSFERRIN, FERRITIN in the last 72 hours. Studies/Results: DG Chest 1 View  Result Date: 01/28/2020 CLINICAL DATA:  COVID-19 positive, ECMO, central line thrombosis EXAM: CHEST  1 VIEW COMPARISON:  Chest radiograph from earlier today. FINDINGS: Stable right internal jugular ECMO catheter entering the IVC. Left subclavian central venous catheter terminates over the high left mediastinum, unchanged. Left internal jugular central venous catheter is stable with tip in the region of middle third of the SVC. Right PICC enters the SVC with the tip not well visualized. Enteric tube enters the stomach with the tip not seen on this image. No pneumothorax. Complete opacification of the hemithoraces bilaterally, unchanged. IMPRESSION: 1. Stable support structures. No pneumothorax. Left subclavian central venous catheter tip overlies the high left mediastinum, unchanged, indeterminate in location as described on prior chest radiograph, potentially at the junction of the left internal jugular and left subclavian veins. 2. Stable complete opacification of the hemithoraces bilaterally. Electronically Signed   By: Ilona Sorrel M.D.   On: 01/28/2020 16:25   DG Chest 1 View  Result Date: 01/28/2020 CLINICAL DATA:  Status post line placement today. Shortness of breath. COVID-19 pneumonia. EXAM: CHEST  1 VIEW COMPARISON:  Single-view of the chest 01/28/2020. FINDINGS: New left subclavian central venous catheter tip projects medial to the left clavicular head. The  catheter crosses the left IJ catheter and its position is unclear. It may be indenting medial wall of the junction of the left internal jugular and subclavian veins. ECMO cannula, feeding tube and left IJ catheter are unchanged. There is complete whiteout of the chest bilaterally. Cardiac silhouette is not visible. IMPRESSION: Position of the tip of the patient's  left subclavian catheter tip is indeterminate. It may be indenting the medial wall of the junction of the left subclavian and left internal jugular veins. No change in complete whiteout of the chest. Critical Value/emergent results were called by telephone at the time of interpretation on 01/28/2020 at 3:29 pm to provider [facility] , who verbally acknowledged these results. Electronically Signed   By: Inge Rise M.D.   On: 01/28/2020 15:33   DG CHEST PORT 1 VIEW  Result Date: 01/30/2020 CLINICAL DATA:  Respiratory failure EXAM: PORTABLE CHEST 1 VIEW COMPARISON:  January 29, 2020. FINDINGS: ECMO catheter noted on the right with tip below the diaphragm. Left jugular catheter tip is in the superior vena cava, stable. Left subclavian catheter tip in the left subclavian vein near the junction with the left innominate vein, stable. Feeding tube tip is below the diaphragm. No pneumothorax. There is diffuse airspace opacity bilaterally, stable. There may be superimposed pleural effusions. Cardiac border difficult to discern given the diffuse airspace opacities. No gross change in cardiac silhouette. IMPRESSION: Tube and catheter positions as described without pneumothorax. Widespread airspace opacity bilaterally with potential superimposed effusions. Appearance essentially stable compared to 1 day prior. Electronically Signed   By: Lowella Grip III M.D.   On: 01/30/2020 08:16   DG CHEST PORT 1 VIEW  Result Date: 01/29/2020 CLINICAL DATA:  COVID 19 virus infection. Acute respiratory failure. On ECMO. EXAM: PORTABLE CHEST 1 VIEW COMPARISON:   01/28/2020 FINDINGS: Left subclavian central venous catheter tip is again seen overlying the distal left subclavian vein. Left jugular central venous catheter tip remains in the proximal SVC. Feeding tube remains in place as well as ECMO catheter. Complete opacification of bilateral hemi-thoraces again seen, without significant change. IMPRESSION: Complete opacification of bilateral hemi-thoraces, without significant change. Stable support lines and tubes. Electronically Signed   By: Marlaine Hind M.D.   On: 01/29/2020 07:41   VAS [place] UPPER EXTREMITY ARTERIAL DUPLEX  Result Date: 01/29/2020 UPPER EXTREMITY DUPLEX STUDY Indications: Patient complains of discoloration of three digits on the right              hand. History:     Patient has a history ofCovid positive - acute hypoxic respiratory              failure requiring ECMO.  Performing Technologist: Oda Cogan RDMS, RVT  Examination Guidelines: A complete evaluation includes B-mode imaging, spectral Doppler, color Doppler, and power Doppler as needed of all accessible portions of each vessel. Bilateral testing is considered an integral part of a complete examination. Limited examinations for reoccurring indications may be performed as noted.  Right Doppler Findings: +---------------+----------+---------+--------+--------+ Site           PSV (cm/s)Waveform StenosisComments +---------------+----------+---------+--------+--------+ Subclavian Dist                                    +---------------+----------+---------+--------+--------+ Brachial Dist  103       triphasic                 +---------------+----------+---------+--------+--------+ Radial Prox    53        triphasic                 +---------------+----------+---------+--------+--------+ Radial Mid     55        triphasic                 +---------------+----------+---------+--------+--------+  Radial Dist    72        triphasic                  +---------------+----------+---------+--------+--------+ Ulnar Prox     75        triphasic                 +---------------+----------+---------+--------+--------+ Ulnar Mid      57        triphasic                 +---------------+----------+---------+--------+--------+ Ulnar Dist     83        biphasic                  +---------------+----------+---------+--------+--------+ Palmar Arch              Patent                    +---------------+----------+---------+--------+--------+   Summary:  Right: Patient right upper extremity arterial system and right        Palmar Arch. *See table(s) above for measurements and observations. Electronically signed by Ruta Hinds MD on 01/29/2020 at 5:11:31 PM.    Final     Medications: Infusions: . sodium chloride 250 mL (01/29/20 1444)  . albumin human 12.5 g (01/28/20 1229)  . albumin human    . amiodarone 30 mg/hr (01/30/20 0800)  . dexmedetomidine (PRECEDEX) IV infusion 1.2 mcg/kg/hr (01/30/20 0800)  . feeding supplement (PIVOT 1.5 CAL) 75 mL/hr at 01/30/20 0200  . fentaNYL infusion INTRAVENOUS Stopped (01/27/20 0057)  . insulin 12 mL/hr at 01/30/20 0800  . milrinone 0.125 mcg/kg/min (01/30/20 0800)  . norepinephrine (LEVOPHED) Adult infusion 8 mcg/min (01/30/20 0800)  . piperacillin-tazobactam (ZOSYN)  IV Stopped (01/30/20 0602)  . prismasol BGK 2/2.5 dialysis solution 2,000 mL/hr at 01/30/20 0517  . prismasol BGK 2/2.5 replacement solution 500 mL/hr at 01/30/20 2633  . prismasol BGK 2/2.5 replacement solution 300 mL/hr at 01/30/20 0353  . vasopressin 0.04 Units/min (01/30/20 0800)    Scheduled Medications: . sodium chloride   Intravenous Once  . sodium chloride   Intravenous Once  . vitamin C  500 mg Per Tube Daily  . chlorhexidine gluconate (MEDLINE KIT)  15 mL Mouth Rinse BID  . Chlorhexidine Gluconate Cloth  6 each Topical Daily  . clonazePAM  1 mg Per Tube Q6H  . docusate  100 mg Per Tube BID  . feeding  supplement (PROSource TF)  45 mL Per Tube BID  . fentaNYL (SUBLIMAZE) injection  200 mcg Intravenous Once  . linagliptin  5 mg Per Tube Daily  . mouth rinse  15 mL Mouth Rinse 10 times per day  . midazolam  2 mg Intravenous Once  . nutrition supplement (JUVEN)  1 packet Per Tube BID BM  . oxyCODONE  10 mg Per Tube Q6H  . pantoprazole (PROTONIX) IV  40 mg Intravenous Daily  . polyethylene glycol  17 g Per Tube Daily  . rocuronium  1 mg/kg Intravenous Once  . sennosides  5 mL Per Tube BID  . vecuronium  10 mg Intravenous Once  . zinc sulfate  220 mg Per Tube Daily    have reviewed scheduled and prn medications.  Physical Exam: General: Critically ill looking female, sedated, has high flow nasal cannula with ECMO Heart:RRR, s1s2 nl Lungs: Coarse breath sound bilateral Abdomen:soft, non-distended Extremities: Lower extremity edema present Dialysis Access: Temporary HD catheter  Alisha Stephenson Tanna Furry 01/30/2020,8:48  AM  LOS: 22 days  Pager: [phone]

## 2020-01-30 NOTE — Procedures (Signed)
Bronchoscopy Procedure Note  Alisha Stephenson  [id number]  July 26, 1965  Date:01/30/20  Time:9:59 AM   Provider Performing:Rennae Ferraiolo P [name]   Procedure(s):  Flexible bronchoscopy with bronchial alveolar lavage [phone])  Indication(s) ETT confirmation, elevated airway pressures due to mucus plugging/ clots  Consent Unable to obtain consent due to emergent nature of procedure.  Anesthesia Midazolam, precedex   Time Out Verified patient identification, verified procedure, site/side was marked, verified correct patient position, special equipment/implants available, medications/allergies/relevant history reviewed, required imaging and test results available.   Sterile Technique Usual hand hygiene, masks, gowns, and gloves were used   Procedure Description Bronchoscope advanced through endotracheal tube and into airway.  Airways were examined down to subsegmental level with findings noted below.   Following diagnostic evaluation, RLL BAL(s) performed in 50cc with normal saline and return of 15cc fluid  Findings: thick mucus plugs, clots throughout airways, including occluding distal ETT, bilateral mainstem bronchi. ETT confirmed 1cm above carina, pulled back 1 cm.   Complications/Tolerance None; patient tolerated the procedure well. Chest X-ray is not needed post procedure.   EBL Minimal   Specimen(s) BAL RLL for resp culture  Alisha Hy, DO 01/30/20 10:01 AM [facility]

## 2020-01-30 NOTE — Progress Notes (Signed)
Advanced Heart Failure Rounding Note   Subjective:    - 8/2 COVID + test - 8/9 Cannulated for VV ECMO - 8/13 with several areas of intracranial hemorrhage. Bival stopped.  - 8/14 CT no change in [place]. Increased edema - 8/16 Extubated - 8/16 Head CT stable bleed - 8/22 CVVHD started - 8/24 reintubated  Sats remain in 70s this am. Remains on CVVHD. Not following commands. Given 1 unit platelets yesterday with no increase in PLT count. LDH climbing  ECMO   Flow 5.26L RPM 4200 Sweep13  Labs:  7.45/35/59/92% Hgb8.8 PLT 29k -> 1 unit -> 30k LDH 429 -> 451 -> 576 -> 527 -> 547 -> 655 -> 917 PTT => off bival Lactic acid1.6-> 1.5 -> 2.0 -> 1.7 -> 3.5 -> 2.8 -> 1.9 -> 2.9 -> 2.0 -> 1.7 Fibrinogen 90  Objective:   Weight Range:  Vital Signs:   Temp:  [95.5 F (35.3 C)-97.2 F (36.2 C)] 96.6 F (35.9 C) (08/24 0800) Pulse Rate:  [72-93] 88 (08/24 0800) Resp:  [15-60] 25 (08/24 0800) BP: (96-150)/(39-106) 108/44 (08/24 0800) SpO2:  [79 %-93 %] 81 % (08/24 0800) Arterial Line BP: (73-170)/(47-81) 105/49 (08/24 0800) FiO2 (%):  [100 %] 100 % (08/24 0725) Weight:  [155.2 kg] 155.2 kg (08/24 0500) Last BM Date: 01/29/20  Weight change: Filed Weights   01/28/20 0500 01/29/20 0341 01/30/20 0500  Weight: (!) 152 kg (!) 156.5 kg (!) 155.2 kg    Intake/Output:   Intake/Output Summary (Last 24 hours) at 01/30/2020 1035 Last data filed at 01/30/2020 0800 Gross per 24 hour  Intake 4258.06 ml  Output 4776 ml  Net -517.94 ml     Physical Exam: General:  Ill appearing. Guppy breathing HEENT: normal Neck: supple. RIJ ECMO cannula.  Cor: PMI nondisplaced. Regular rate & rhythm. No rubs, gallops or murmurs. L subclav HD line with oozing Lungs: coarse Abdomen: obese soft, nontender, nondistended. No hepatosplenomegaly. No bruits or masses. Good bowel sounds. Extremities: no cyanosis, clubbing, rash, 1+ edema R thumb and 2 fingers bluish  Neuro: awake. Not following  commands   Telemetry: Sinus 80-90 Personally reviewed   Labs: Basic Metabolic Panel: Recent Labs  Lab 01/27/20 1505 01/27/20 2221 01/28/20 0308 01/28/20 2094 01/28/20 1559 01/28/20 1619 01/29/20 0451 01/29/20 7096 01/29/20 1722 01/29/20 1749 01/29/20 2040 01/30/20 0324 01/30/20 0538  NA   < >  --  148*   < > 149*   < > 147*   < > 140 140 139 141 137  K   < >  --  4.8   < > 5.9*   < > 4.8   < > 4.3 4.1 4.4 4.7 4.6  CL   < >  --  110  --  110  --  109  --  102  --   --  103  --   CO2   < >  --  26  --  22  --  25  --  25  --   --  25  --   GLUCOSE   < >  --  308*  --  207*  --  211*  --  235*  --   --  164*  --   BUN   < >  --  188*  --  200*  --  151*  --  113*  --   --  95*  --   CREATININE   < >  --  1.87*  --  1.90*  --  1.45*  --  1.26*  --   --  1.31*  --   CALCIUM   < >  --  9.3   < > 9.1   < > 8.6*  --  8.2*  --   --  8.0*  --   MG  --  3.2*  --   --   --   --  3.3*  --   --   --   --  2.9*  --   PHOS  --   --   --   --   --   --  5.0*  --   --   --   --  6.0*  --    < > = values in this interval not displayed.    Liver Function Tests: Recent Labs  Lab 01/26/20 0401 01/27/20 0410 01/28/20 0308 01/29/20 0451 01/30/20 0324  AST 52* 40 35 65* 123*  ALT 61* 61* 61* 59* 69*  ALKPHOS 151* 135* 140* 137* 139*  BILITOT 1.7* 2.0* 1.4* 1.5* 1.8*  PROT 5.2* 5.0* 5.3* 5.4* 5.3*  ALBUMIN 3.1* 2.8* 2.8* 3.2* 2.9*   No results for input(s): LIPASE, AMYLASE in the last 168 hours. No results for input(s): AMMONIA in the last 168 hours.  CBC: Recent Labs  Lab 01/28/20 1559 01/28/20 1619 01/28/20 2354 01/28/20 2354 01/29/20 0451 01/29/20 3007 01/29/20 1722 01/29/20 1749 01/29/20 2040 01/29/20 2124 01/30/20 0324 01/30/20 0538  WBC 9.9  --  7.9  --  9.3  --  11.9*  --   --   --  11.3*  --   HGB 7.7*   < > 8.5*   < > 8.6*   < > 8.4* 8.8* 8.5*  --  8.5* 8.8*  HCT 26.3*   < > 28.1*   < > 28.6*   < > 28.0* 26.0* 25.0*  --  27.3* 26.0*  MCV 103.5*  --  102.6*  --   101.8*  --  101.8*  --   --   --  101.1*  --   PLT 30*   < > 29*  --  29*  --  PLATELET CLUMPS NOTED ON SMEAR, UNABLE TO ESTIMATE  --   --  31* 30*  --    < > = values in this interval not displayed.    Cardiac Enzymes: No results for input(s): CKTOTAL, CKMB, CKMBINDEX, TROPONINI in the last 168 hours.  BNP: BNP (last 3 results) No results for input(s): BNP in the last 8760 hours.  ProBNP (last 3 results) No results for input(s): PROBNP in the last 8760 hours.    Other results:  Imaging: DG Chest 1 View  Result Date: 01/28/2020 CLINICAL DATA:  COVID-19 positive, ECMO, central line thrombosis EXAM: CHEST  1 VIEW COMPARISON:  Chest radiograph from earlier today. FINDINGS: Stable right internal jugular ECMO catheter entering the IVC. Left subclavian central venous catheter terminates over the high left mediastinum, unchanged. Left internal jugular central venous catheter is stable with tip in the region of middle third of the SVC. Right PICC enters the SVC with the tip not well visualized. Enteric tube enters the stomach with the tip not seen on this image. No pneumothorax. Complete opacification of the hemithoraces bilaterally, unchanged. IMPRESSION: 1. Stable support structures. No pneumothorax. Left subclavian central venous catheter tip overlies the high left mediastinum, unchanged, indeterminate in location as described on prior chest radiograph, potentially at the junction of the left internal jugular and  left subclavian veins. 2. Stable complete opacification of the hemithoraces bilaterally. Electronically Signed   By: Ilona Sorrel M.D.   On: 01/28/2020 16:25   DG Chest 1 View  Result Date: 01/28/2020 CLINICAL DATA:  Status post line placement today. Shortness of breath. COVID-19 pneumonia. EXAM: CHEST  1 VIEW COMPARISON:  Single-view of the chest 01/28/2020. FINDINGS: New left subclavian central venous catheter tip projects medial to the left clavicular head. The catheter crosses the  left IJ catheter and its position is unclear. It may be indenting medial wall of the junction of the left internal jugular and subclavian veins. ECMO cannula, feeding tube and left IJ catheter are unchanged. There is complete whiteout of the chest bilaterally. Cardiac silhouette is not visible. IMPRESSION: Position of the tip of the patient's left subclavian catheter tip is indeterminate. It may be indenting the medial wall of the junction of the left subclavian and left internal jugular veins. No change in complete whiteout of the chest. Critical Value/emergent results were called by telephone at the time of interpretation on 01/28/2020 at 3:29 pm to provider [facility] , who verbally acknowledged these results. Electronically Signed   By: Inge Rise M.D.   On: 01/28/2020 15:33   DG CHEST PORT 1 VIEW  Result Date: 01/30/2020 CLINICAL DATA:  Respiratory failure EXAM: PORTABLE CHEST 1 VIEW COMPARISON:  January 29, 2020. FINDINGS: ECMO catheter noted on the right with tip below the diaphragm. Left jugular catheter tip is in the superior vena cava, stable. Left subclavian catheter tip in the left subclavian vein near the junction with the left innominate vein, stable. Feeding tube tip is below the diaphragm. No pneumothorax. There is diffuse airspace opacity bilaterally, stable. There may be superimposed pleural effusions. Cardiac border difficult to discern given the diffuse airspace opacities. No gross change in cardiac silhouette. IMPRESSION: Tube and catheter positions as described without pneumothorax. Widespread airspace opacity bilaterally with potential superimposed effusions. Appearance essentially stable compared to 1 day prior. Electronically Signed   By: Lowella Grip III M.D.   On: 01/30/2020 08:16   DG CHEST PORT 1 VIEW  Result Date: 01/29/2020 CLINICAL DATA:  COVID 19 virus infection. Acute respiratory failure. On ECMO. EXAM: PORTABLE CHEST 1 VIEW COMPARISON:  01/28/2020 FINDINGS:  Left subclavian central venous catheter tip is again seen overlying the distal left subclavian vein. Left jugular central venous catheter tip remains in the proximal SVC. Feeding tube remains in place as well as ECMO catheter. Complete opacification of bilateral hemi-thoraces again seen, without significant change. IMPRESSION: Complete opacification of bilateral hemi-thoraces, without significant change. Stable support lines and tubes. Electronically Signed   By: Marlaine Hind M.D.   On: 01/29/2020 07:41   VAS [place] UPPER EXTREMITY ARTERIAL DUPLEX  Result Date: 01/29/2020 UPPER EXTREMITY DUPLEX STUDY Indications: Patient complains of discoloration of three digits on the right              hand. History:     Patient has a history ofCovid positive - acute hypoxic respiratory              failure requiring ECMO.  Performing Technologist: Oda Cogan RDMS, RVT  Examination Guidelines: A complete evaluation includes B-mode imaging, spectral Doppler, color Doppler, and power Doppler as needed of all accessible portions of each vessel. Bilateral testing is considered an integral part of a complete examination. Limited examinations for reoccurring indications may be performed as noted.  Right Doppler Findings: +---------------+----------+---------+--------+--------+ Site  PSV (cm/s)Waveform StenosisComments +---------------+----------+---------+--------+--------+ Subclavian Dist                                    +---------------+----------+---------+--------+--------+ Brachial Dist  103       triphasic                 +---------------+----------+---------+--------+--------+ Radial Prox    53        triphasic                 +---------------+----------+---------+--------+--------+ Radial Mid     55        triphasic                 +---------------+----------+---------+--------+--------+ Radial Dist    72        triphasic                  +---------------+----------+---------+--------+--------+ Ulnar Prox     75        triphasic                 +---------------+----------+---------+--------+--------+ Ulnar Mid      57        triphasic                 +---------------+----------+---------+--------+--------+ Ulnar Dist     83        biphasic                  +---------------+----------+---------+--------+--------+ Palmar Arch              Patent                    +---------------+----------+---------+--------+--------+   Summary:  Right: Patient right upper extremity arterial system and right        Palmar Arch. *See table(s) above for measurements and observations. Electronically signed by Ruta Hinds MD on 01/29/2020 at 5:11:31 PM.    Final      Medications:     Scheduled Medications: . sodium chloride   Intravenous Once  . sodium chloride   Intravenous Once  . vitamin C  500 mg Per Tube Daily  . chlorhexidine gluconate (MEDLINE KIT)  15 mL Mouth Rinse BID  . Chlorhexidine Gluconate Cloth  6 each Topical Daily  . clonazePAM  1 mg Per Tube Q6H  . docusate  100 mg Per Tube BID  . EPINEPHrine      . feeding supplement (PROSource TF)  45 mL Per Tube BID  . fentaNYL (SUBLIMAZE) injection  50 mcg Intravenous Once  . linagliptin  5 mg Per Tube Daily  . mouth rinse  15 mL Mouth Rinse 10 times per day  . nutrition supplement (JUVEN)  1 packet Per Tube BID BM  . oxyCODONE  10 mg Per Tube Q6H  . pantoprazole (PROTONIX) IV  40 mg Intravenous Daily  . polyethylene glycol  17 g Per Tube Daily  . sennosides  5 mL Per Tube BID  . vecuronium  10 mg Intravenous Once  . zinc sulfate  220 mg Per Tube Daily    Infusions: . sodium chloride 250 mL (01/29/20 1444)  . albumin human 12.5 g (01/28/20 1229)  . albumin human    . amiodarone 30 mg/hr (01/30/20 1020)  . dexmedetomidine (PRECEDEX) IV infusion 0.591 mcg/kg/hr (01/30/20 1018)  . feeding supplement (PIVOT 1.5 CAL) 75 mL/hr at 01/30/20 0200  . fentaNYL  infusion INTRAVENOUS 50  mcg/hr (01/30/20 3903)  . insulin 12 mL/hr at 01/30/20 0800  . milrinone 0.125 mcg/kg/min (01/30/20 0800)  . norepinephrine (LEVOPHED) Adult infusion 8 mcg/min (01/30/20 0800)  . piperacillin-tazobactam (ZOSYN)  IV Stopped (01/30/20 0602)  . prismasol BGK 2/2.5 dialysis solution 2,000 mL/hr at 01/30/20 0850  . prismasol BGK 2/2.5 replacement solution 500 mL/hr at 01/30/20 0092  . prismasol BGK 2/2.5 replacement solution 300 mL/hr at 01/30/20 0353  . vasopressin 0.04 Units/min (01/30/20 1020)    PRN Medications: sodium chloride, acetaminophen (TYLENOL) oral liquid 160 mg/5 mL, albumin human, albuterol, dextrose, fentaNYL, fentaNYL (SUBLIMAZE) injection, guaiFENesin-dextromethorphan, heparin, hydrALAZINE, hydrOXYzine, ondansetron **OR** ondansetron (ZOFRAN) IV, sodium chloride   Assessment/Plan:   1. Acute hypoxic/hypercapneic respiratory failure in setting of severe COVID PNA/ARDS -> VV ECMO - admit 8/2 - intubation 8/3 - has received actmera (compelted 8/2), remdesivir (completed 8/6) and steroids - failed full vent support with proning/paralytic - Cannulated for VV ECMO on 8/9 - Extubated 8/16 - CXR unchanged  With diffuse infiltrates Personally reviewed - Off bival due to [place] - Sats persistently low this am. Emergently re-intubated after rounds - Cannula appears to be a bit deep. Circuit clearly hemolyzing now and will need circuit change.  - Will need to discuss with family risks of circuit change (possibe hypoxic arrest) and need for low-dose anticoagulation (worsening bleeding) - On VP & NE for BP support as needed - CVVHD started 8/22 for volume removal and uremia will continue - I am increasingly concerned about her prognosis.   2. Enterococcus sepsis - back on high-dose zosyn per ID  3. Intracranial hemorrhage - ? Septic emboli - repeat head CT on 8/14 with stable bleeds but increased edema - neurology following. Suspect significant long-term  injury sustained. Will follow commands. Appears to have dense LUE weakness and possibly LLE - repeat head CT stable 8/16 - Will need to restart low-dose AC now with circuit issues - Consider TEE at some point to further evalaute. Can consider now that she is re-intubated but won't change management currently  4. Thrombocytopenia - PLTs < 40K - initially thought due to sepsis but not improving. ? Circuit related - oozing from HD site - suspect hemolysis from circuit. Plan possible circuit change today  5. Morbid obesity - Body mass index is 49.38 kg/m.  6. Poorly controlled DM2 - HgBA1c 10.7 - CBGs remain elevated - On IV insulin adjust as needed  6. Hypernatremia - Na 141.Improving with HD.   7. Lactic acidosis -  improving  8. PAF - 2 episodes of AF with RVR on 8/21 - back in NSR on IV amio this am. Continue   9. AKI/azotemia - CVVHD started on 8/22 - Discussed with Renal this am. Will continue  10. Ischemic R fingers - ? Due to septic embolic versus vascular issue - check /us. Consult VVS   CRITICAL CARE Performed by: Glori Bickers  Total critical care time: 40 minutes  Critical care time was exclusive of separately billable procedures and treating other patients.  Critical care was necessary to treat or prevent imminent or life-threatening deterioration.  Critical care was time spent personally by me (independent of midlevel providers or residents) on the following activities: development of treatment plan with patient and/or surrogate as well as nursing, discussions with consultants, evaluation of patient's response to treatment, examination of patient, obtaining history from patient or surrogate, ordering and performing treatments and interventions, ordering and review of laboratory studies, ordering and review of radiographic studies, pulse oximetry and  re-evaluation of patient's condition.   Length of Stay: 22   Glori Bickers  MD 01/30/2020, 10:35  AM  Advanced Heart Failure Team Pager [phone] (M-F; [place])  Please contact [facility] for night-coverage after hours (4p -7a ) and weekends on [email or web address]

## 2020-01-30 NOTE — CV Procedure (Signed)
ECMO NOTE:  Indication: Respiratory failure due to COVID PNA  Initial cannulation date: 01/24/2020  ECMO type: VV ECMO (Centrimag with oximizer)  Dual lumen Inflow/return cannula: 32 FR RIJ [place] placed 01/17/2020  ECMO events:  - Initial cannulation 01/17/2020 - Cannula repositioned (pulled back ) 01/17/20 - Off bival 8/13 due to [place]  Daily data:  Flow5.26 RPM 4200 Sweep13  Labs:  ABG    Component Value Date/Time   PHART 7.361 01/30/2020 0538   PCO2ART 45.8 01/30/2020 0538   PO2ART 40 (LL) 01/30/2020 0538   HCO3 26.3 01/30/2020 0538   TCO2 28 01/30/2020 0538   ACIDBASEDEF 1.0 01/27/2020 1954   O2SAT 77.0 01/30/2020 0538     BG remain high despite insulin drip BUN/Cr improved on CRRT LDH 547> 655> 917 Fibrinogen back down below 60-- remains there since 8/19>> 90  INR 2.5>>1.9>>2.0> 2.0> 1.6 PTT 41 HgB 8.5>>8.2>>8.7> 8.8 Plts 34>>37>>35> 30> 29> 31>30 WBC 13>>11>>11> 9.9> 7.9> 9.3> 11.3   Assessment: -Severe COVID ARDS s/p VV ECMO -Enterococcal bacteremia -MSSA pneumonia -Thrombocytopenia- some combination of hemolysis, bone marrow stunning, and sepsis effect, possibly contribution of chronic liver dysfunction remains possible - Intracerebral hemorrhage with question of septic emboli - Muscular deconditioning - Acute kidney failure requiring CVVHD -Likely ischemic digits due to embolic phenomenon on right upper extremity  Plan: -Continue VV ECMO support. Needs circuit change today. Cannula to be pulled back. Will require reintubation prior to circuit change. Discussed with husband [name], who will discuss with daughters and call back to provide consent. -Continue heated high flow nasal cannula oxygen> will be reintubated today -Low-dose heparin for circuit change and to prevent clotting of new circuit -Continue CVVHD -Continue insulin gtt-does not observe subcu insulin -Dual beta lactam therapy per ID, TEE at some point when safer.  Continue  empirically treating for endocarditis given concern for vascular emboli. -Limited code, daily discussions with family. Will be temporarily reversed for circuit change if family consents. -off covid isolation due to cleared viral load  Multidisciplinary rounds with cardiology, critical care medicine, cardiothoracic surgery, coordinator, PharmDs, RTs, and all nurses present.  Julian Hy, DO 01/30/20 8:25 AM [facility]

## 2020-01-30 NOTE — Procedures (Signed)
Intubation Procedure Note  STARLYN DROGE  [id number]  1966-01-22  Date:01/30/20  Time:9:57 AM   Provider Performing:Judieth Mckown P [name]    Procedure: Intubation (35361)  Indication(s) Respiratory Failure  Consent Risks of the procedure as well as the alternatives and risks of each were explained to the patient and/or caregiver.  Consent for the procedure was obtained and is signed in the bedside chart   Anesthesia Versed and Rocuronium   Time Out Verified patient identification, verified procedure, site/side was marked, verified correct patient position, special equipment/implants available, medications/allergies/relevant history reviewed, required imaging and test results available.   Sterile Technique Usual hand hygeine, masks, and gloves were used   Procedure Description Patient positioned in bed supine.  Sedation given as noted above.  Patient was intubated with endotracheal tube using Glidescope.  View was Grade 4 no glottis structures visible due to bloody secretions.  Number of attempts was 2.  Colorimetric CO2 detector was consistent with tracheal placement. Difficult to BVM placement confirmed by bronchoscopy.   Complications/Tolerance None; patient tolerated the procedure well. Chest X-ray is ordered to verify placement.   EBL Minimal   Specimen(s) None  Julian Hy, DO 01/30/20 9:59 AM [facility]

## 2020-01-30 NOTE — Progress Notes (Signed)
NAME:  Alisha Stephenson, MRN:  [id number], DOB:  07/28/1965, LOS: 32 ADMISSION DATE:  01/10/2020, CONSULTATION DATE:  8/3 REFERRING MD:  Alfredia Ferguson, CHIEF COMPLAINT:  Dyspnea   Brief History   54 y/o female admitted on 8/2 with severe acute respiratory failure with hypoxemia due to COVID 19 pneumonia.  She developed symptoms 1 week prior to admission.  Past Medical History  DM2 Diverticulitis Gallstones Ovarian cyst NAFLD Asthma  Significant Hospital Events   8/2 admit 8/3 ICU transfer, intubated 8/4 prone, paralyze 8/9 significant desaturations today 8/9 VV ECMO cannulation 8/13 head bleed  Consults:  PCCM ECMO team    Procedures:  8/3 ETT >  8/3 PICC >  8/9 LIJ MML 8/9 RIJ [place] 44F   Significant Diagnostic Tests:  7/31 CT head > NAICP 7/31 MRI/MRA brain > no acute changes, possibly small aneurysm ACOM 8/13 CT head> multiple areas of ICH  Micro Data:  8/2 blood > NG 8/2 SARS COV 2 > positive 8/4 resp > negative 8/4 urine >  8/12 blood > E. faecalis (pan-sensitive) 8/12 resp: staph aureus> MSSA  Antimicrobials:  8/2 remdesivir > 8/6 8/2 actemra  8/2 solumedrol >   8/3 ceftriaxone >  8/5 8/3 azithro >  8/5  8/12 meropenem>8/16 8/12 vanc>8/16  8/13 zosyn >> 8/20 (planned) Vanc 8/19>> 8/22 Fluconazole 8/19>> 8/22  Interim history/subjective:  Still some bleeding from HD catheter. Remains on HFNC. No acute changes.  Objective   Blood pressure (!) 108/44, pulse 83, temperature (!) 96.4 F (35.8 C), resp. rate (!) 31, height 5\' 4"  (1.626 m), weight (!) 155.2 kg, SpO2 (!) 83 %. CVP:  [17 mmHg-27 mmHg] 24 mmHg  FiO2 (%):  [100 %] 100 %   Intake/Output Summary (Last 24 hours) at 01/30/2020 0709 Last data filed at 01/30/2020 0700 Gross per 24 hour  Intake 4654.8 ml  Output 5543 ml  Net -888.2 ml   Filed Weights   01/28/20 0500 01/29/20 0341 01/30/20 0500  Weight: (!) 152 kg (!) 156.5 kg (!) 155.2 kg    Examination:  GEN: critically ill appearing  woman lying in bed no acute distress HEENT: [place]/AT, eyes anicteric, mild scleral edema CV: Regular rate and rhythm, no murmurs PULM: Tachypnea, no significantly increased work of breathing.  Distant breath sounds bilaterally. GI: Obese, soft, nontender, nondistended EXT: Diffuse anasarca.  Cyanosis of distal and ring finger distal to PIP joints NEURO: RASS -5 on Precedex. SKIN: Cyanosis only right two digits, no significant ecchymoses.  No petechiae.   Labs reviewed CXR 8/23 personally reviewed: Minimally aerated lungs, ECMO cannula in place.   Resolved Hospital Problem list     Assessment & Plan:  Acute hypoxic and AoC hypercapneic respiratory failure; likely underly OHS (baseline bicarb 27-30) ARDS due to COVID 19 pneumonia   VV ECMO cannulation on 8/9 for refractory hypoxemia MSSA pneumonia -Continue VV ECMO- pulling catheter back and circuit change today due to clotting -Will reintubate for circuit change. May require tracheostomy to facilitate sedation requirements rather than re-extubation. -Physical therapy, occupational therapy needed for deconditioning  Enterococcal bacteremia-recent blood cultures cleared Concern for possible embolic phenomenon- fingers and possibly CVAs were embolic  -Appreciate infectious disease team's assistance.  Back on zosyn due to concern for bioavailability of drug on ECMO. -Right radial Doppler ultrasound and vascular surgery consult today  Elevated LDH, hypo-fibrinogenemia and thrombocytopenia; most likely due to dedication side effect versus sepsis versus chronic hepatic disease less likely. Hb stable and low platelets but no schistocytes on  smear suggest DIC is not cause.   -Continue to monitor platelet count -Discontinue quetiapine, pantoprazole.  Vancomycin, penicillin based antibiotics also potentially cause thrombocytopenia, but B-lactam antibiotics cannot be discontinued at this time.  ICH, multifocal. L-sided weakness.  Encephalopathy-  ICU delirium + ischemic injury + azotemia - Reorient as able - will have to reintubate for circuit change; will have to reassess ability to protect airway when assessing for re-extubation - Very coagulopathic, holding on [facility] for now given good flows in circuit  Acute renal failure, severe uremia -Continue CRRT -Strict I's/O. Goal net negative. -Renally dose medications and avoid nephrotoxic meds.  Shock related to sedation and RV stunning- milrinone and PRN norepinephrine Septic shock- Enterococcus bacteremia Increased WBC, hypotension and question sepsis 8/19, question of septic emboli to explain head bleeds: Recent cultures negative to date.   -ID following- appreciate their assistance -NE PRN to maintain MAP >65   Hyperglycemia > not controlled at all with [place] insulin despite aggressive upward titration.  Assume poor absorption, potentially due to subcutaneous edema.   Remains elevated on IV. -To remain on insulin infusion -Goal BG 140-180 while admitted to the ICU -Continue linagliptin to decrease insulin requirements  Constipation, resolved  Hypernatremia  -Free water held; goal sodium 145-150  Acute anemia due to critical illness -transfuse for Hb <8 -con't to monitor    Best practice:  Diet: tube feeding Pain/Anxiety/Delirium protocol (if indicated): as above VAP protocol (if indicated): yes DVT prophylaxis: SCDs while bivalirudin off GI prophylaxis: pantoprazole Glucose control:  insulin infusion  Mobility: bed rest Code Status: limited, intubation okay but no shocks or compressions>> reversed with family's consent for circuit change Family Communication: husband [name] updated via phone> husband consented over phone for circuit change, intubation today Disposition: ICU  This patient is critically ill with multiple organ system failure which requires frequent high complexity decision making, assessment, support, evaluation, and titration of therapies. This was  completed through the application of advanced monitoring technologies and extensive interpretation of multiple databases. During this encounter critical care time was devoted to patient care services described in this note for 51 minutes.   Julian Hy, DO 01/30/20 8:44 AM [facility]

## 2020-01-31 ENCOUNTER — Inpatient Hospital Stay (HOSPITAL_COMMUNITY): Payer: PRIVATE HEALTH INSURANCE

## 2020-01-31 DIAGNOSIS — D689 Coagulation defect, unspecified: Secondary | ICD-10-CM

## 2020-01-31 DIAGNOSIS — Z7189 Other specified counseling: Secondary | ICD-10-CM

## 2020-01-31 LAB — HEPATIC FUNCTION PANEL
ALT: 88 U/L — ABNORMAL HIGH (ref 0–44)
AST: 206 U/L — ABNORMAL HIGH (ref 15–41)
Albumin: 2.4 g/dL — ABNORMAL LOW (ref 3.5–5.0)
Alkaline Phosphatase: 155 U/L — ABNORMAL HIGH (ref 38–126)
Bilirubin, Direct: 0.7 mg/dL — ABNORMAL HIGH (ref 0.0–0.2)
Indirect Bilirubin: 0.9 mg/dL (ref 0.3–0.9)
Total Bilirubin: 1.6 mg/dL — ABNORMAL HIGH (ref 0.3–1.2)
Total Protein: 4.9 g/dL — ABNORMAL LOW (ref 6.5–8.1)

## 2020-01-31 LAB — POCT I-STAT 7, (LYTES, BLD GAS, ICA,H+H)
Acid-Base Excess: 1 mmol/L (ref 0.0–2.0)
Acid-Base Excess: 1 mmol/L (ref 0.0–2.0)
Acid-Base Excess: 0 mmol/L (ref 0.0–2.0)
Acid-Base Excess: 1 mmol/L (ref 0.0–2.0)
Acid-Base Excess: 1 mmol/L (ref 0.0–2.0)
Acid-base deficit: 1 mmol/L (ref 0.0–2.0)
Bicarbonate: 26.1 mmol/L (ref 20.0–28.0)
Bicarbonate: 26.8 mmol/L (ref 20.0–28.0)
Bicarbonate: 26.1 mmol/L (ref 20.0–28.0)
Bicarbonate: 25.9 mmol/L (ref 20.0–28.0)
Bicarbonate: 26.6 mmol/L (ref 20.0–28.0)
Bicarbonate: 26.4 mmol/L (ref 20.0–28.0)
Calcium, Ion: 1.02 mmol/L — ABNORMAL LOW (ref 1.15–1.40)
Calcium, Ion: 1.05 mmol/L — ABNORMAL LOW (ref 1.15–1.40)
Calcium, Ion: 1.06 mmol/L — ABNORMAL LOW (ref 1.15–1.40)
Calcium, Ion: 1.08 mmol/L — ABNORMAL LOW (ref 1.15–1.40)
Calcium, Ion: 1.04 mmol/L — ABNORMAL LOW (ref 1.15–1.40)
Calcium, Ion: 1.08 mmol/L — ABNORMAL LOW (ref 1.15–1.40)
HCT: 23 % — ABNORMAL LOW (ref 36.0–46.0)
HCT: 27 % — ABNORMAL LOW (ref 36.0–46.0)
HCT: 28 % — ABNORMAL LOW (ref 36.0–46.0)
HCT: 26 % — ABNORMAL LOW (ref 36.0–46.0)
HCT: 26 % — ABNORMAL LOW (ref 36.0–46.0)
HCT: 24 % — ABNORMAL LOW (ref 36.0–46.0)
Hemoglobin: 7.8 g/dL — ABNORMAL LOW (ref 12.0–15.0)
Hemoglobin: 9.2 g/dL — ABNORMAL LOW (ref 12.0–15.0)
Hemoglobin: 9.5 g/dL — ABNORMAL LOW (ref 12.0–15.0)
Hemoglobin: 8.8 g/dL — ABNORMAL LOW (ref 12.0–15.0)
Hemoglobin: 8.8 g/dL — ABNORMAL LOW (ref 12.0–15.0)
Hemoglobin: 8.2 g/dL — ABNORMAL LOW (ref 12.0–15.0)
O2 Saturation: 99 %
O2 Saturation: 90 %
O2 Saturation: 92 %
O2 Saturation: 88 %
O2 Saturation: 92 %
O2 Saturation: 97 %
Patient temperature: 35.7
Patient temperature: 97.7
Patient temperature: 34.5
Patient temperature: 35
Potassium: 4.1 mmol/L (ref 3.5–5.1)
Potassium: 4.2 mmol/L (ref 3.5–5.1)
Potassium: 3.9 mmol/L (ref 3.5–5.1)
Potassium: 3.8 mmol/L (ref 3.5–5.1)
Potassium: 4.1 mmol/L (ref 3.5–5.1)
Potassium: 3.9 mmol/L (ref 3.5–5.1)
Sodium: 137 mmol/L (ref 135–145)
Sodium: 136 mmol/L (ref 135–145)
Sodium: 137 mmol/L (ref 135–145)
Sodium: 136 mmol/L (ref 135–145)
Sodium: 137 mmol/L (ref 135–145)
Sodium: 137 mmol/L (ref 135–145)
TCO2: 27 mmol/L (ref 22–32)
TCO2: 28 mmol/L (ref 22–32)
TCO2: 28 mmol/L (ref 22–32)
TCO2: 27 mmol/L (ref 22–32)
TCO2: 28 mmol/L (ref 22–32)
TCO2: 28 mmol/L (ref 22–32)
pCO2 arterial: 41.4 mmHg (ref 32.0–48.0)
pCO2 arterial: 48.2 mmHg — ABNORMAL HIGH (ref 32.0–48.0)
pCO2 arterial: 49.1 mmHg — ABNORMAL HIGH (ref 32.0–48.0)
pCO2 arterial: 51.3 mmHg — ABNORMAL HIGH (ref 32.0–48.0)
pCO2 arterial: 42.5 mmHg (ref 32.0–48.0)
pCO2 arterial: 40.1 mmHg (ref 32.0–48.0)
pH, Arterial: 7.401 (ref 7.350–7.450)
pH, Arterial: 7.35 (ref 7.350–7.450)
pH, Arterial: 7.334 — ABNORMAL LOW (ref 7.350–7.450)
pH, Arterial: 7.311 — ABNORMAL LOW (ref 7.350–7.450)
pH, Arterial: 7.393 (ref 7.350–7.450)
pH, Arterial: 7.418 (ref 7.350–7.450)
pO2, Arterial: 162 mmHg — ABNORMAL HIGH (ref 83.0–108.0)
pO2, Arterial: 61 mmHg — ABNORMAL LOW (ref 83.0–108.0)
pO2, Arterial: 69 mmHg — ABNORMAL LOW (ref 83.0–108.0)
pO2, Arterial: 60 mmHg — ABNORMAL LOW (ref 83.0–108.0)
pO2, Arterial: 56 mmHg — ABNORMAL LOW (ref 83.0–108.0)
pO2, Arterial: 80 mmHg — ABNORMAL LOW (ref 83.0–108.0)

## 2020-01-31 LAB — CBC
HCT: 25.7 % — ABNORMAL LOW (ref 36.0–46.0)
HCT: 25.9 % — ABNORMAL LOW (ref 36.0–46.0)
HCT: 26.6 % — ABNORMAL LOW (ref 36.0–46.0)
Hemoglobin: 8.5 g/dL — ABNORMAL LOW (ref 12.0–15.0)
Hemoglobin: 8.2 g/dL — ABNORMAL LOW (ref 12.0–15.0)
Hemoglobin: 8.8 g/dL — ABNORMAL LOW (ref 12.0–15.0)
MCH: 32.6 pg (ref 26.0–34.0)
MCH: 31.1 pg (ref 26.0–34.0)
MCH: 32.8 pg (ref 26.0–34.0)
MCHC: 33.1 g/dL (ref 30.0–36.0)
MCHC: 31.7 g/dL (ref 30.0–36.0)
MCHC: 33.1 g/dL (ref 30.0–36.0)
MCV: 98.5 fL (ref 80.0–100.0)
MCV: 98.1 fL (ref 80.0–100.0)
MCV: 99.3 fL (ref 80.0–100.0)
Platelets: 34 10*3/uL — ABNORMAL LOW (ref 150–400)
Platelets: 32 10*3/uL — ABNORMAL LOW (ref 150–400)
Platelets: 41 10*3/uL — ABNORMAL LOW (ref 150–400)
RBC: 2.61 MIL/uL — ABNORMAL LOW (ref 3.87–5.11)
RBC: 2.64 MIL/uL — ABNORMAL LOW (ref 3.87–5.11)
RBC: 2.68 MIL/uL — ABNORMAL LOW (ref 3.87–5.11)
RDW: 20.1 % — ABNORMAL HIGH (ref 11.5–15.5)
RDW: 20.7 % — ABNORMAL HIGH (ref 11.5–15.5)
RDW: 21.6 % — ABNORMAL HIGH (ref 11.5–15.5)
WBC: 11 10*3/uL — ABNORMAL HIGH (ref 4.0–10.5)
WBC: 11.8 10*3/uL — ABNORMAL HIGH (ref 4.0–10.5)
WBC: 17.7 10*3/uL — ABNORMAL HIGH (ref 4.0–10.5)
nRBC: 8.8 % — ABNORMAL HIGH (ref 0.0–0.2)
nRBC: 10.9 % — ABNORMAL HIGH (ref 0.0–0.2)
nRBC: 12.8 % — ABNORMAL HIGH (ref 0.0–0.2)

## 2020-01-31 LAB — PROTIME-INR
INR: 1.4 — ABNORMAL HIGH (ref 0.8–1.2)
Prothrombin Time: 16.6 seconds — ABNORMAL HIGH (ref 11.4–15.2)

## 2020-01-31 LAB — BASIC METABOLIC PANEL
Anion gap: 11 (ref 5–15)
Anion gap: 10 (ref 5–15)
BUN: 73 mg/dL — ABNORMAL HIGH (ref 6–20)
BUN: 59 mg/dL — ABNORMAL HIGH (ref 6–20)
CO2: 22 mmol/L (ref 22–32)
CO2: 24 mmol/L (ref 22–32)
Calcium: 7.1 mg/dL — ABNORMAL LOW (ref 8.9–10.3)
Calcium: 7.3 mg/dL — ABNORMAL LOW (ref 8.9–10.3)
Chloride: 101 mmol/L (ref 98–111)
Chloride: 101 mmol/L (ref 98–111)
Creatinine, Ser: 1.26 mg/dL — ABNORMAL HIGH (ref 0.44–1.00)
Creatinine, Ser: 1.16 mg/dL — ABNORMAL HIGH (ref 0.44–1.00)
GFR calc Af Amer: 56 mL/min — ABNORMAL LOW (ref >60)
GFR calc Af Amer: >60 mL/min (ref >60)
GFR calc non Af Amer: 48 mL/min — ABNORMAL LOW (ref >60)
GFR calc non Af Amer: 53 mL/min — ABNORMAL LOW (ref >60)
Glucose, Bld: 278 mg/dL — ABNORMAL HIGH (ref 70–99)
Glucose, Bld: 218 mg/dL — ABNORMAL HIGH (ref 70–99)
Potassium: 3.9 mmol/L (ref 3.5–5.1)
Potassium: 4 mmol/L (ref 3.5–5.1)
Sodium: 134 mmol/L — ABNORMAL LOW (ref 135–145)
Sodium: 135 mmol/L (ref 135–145)

## 2020-01-31 LAB — APTT
aPTT: 38 seconds — ABNORMAL HIGH (ref 24–36)
aPTT: 42 seconds — ABNORMAL HIGH (ref 24–36)

## 2020-01-31 LAB — GLUCOSE, CAPILLARY
Glucose-Capillary: 153 mg/dL — ABNORMAL HIGH (ref 70–99)
Glucose-Capillary: 162 mg/dL — ABNORMAL HIGH (ref 70–99)
Glucose-Capillary: 162 mg/dL — ABNORMAL HIGH (ref 70–99)
Glucose-Capillary: 166 mg/dL — ABNORMAL HIGH (ref 70–99)
Glucose-Capillary: 180 mg/dL — ABNORMAL HIGH (ref 70–99)
Glucose-Capillary: 193 mg/dL — ABNORMAL HIGH (ref 70–99)
Glucose-Capillary: 199 mg/dL — ABNORMAL HIGH (ref 70–99)
Glucose-Capillary: 202 mg/dL — ABNORMAL HIGH (ref 70–99)
Glucose-Capillary: 210 mg/dL — ABNORMAL HIGH (ref 70–99)
Glucose-Capillary: 210 mg/dL — ABNORMAL HIGH (ref 70–99)
Glucose-Capillary: 213 mg/dL — ABNORMAL HIGH (ref 70–99)
Glucose-Capillary: 218 mg/dL — ABNORMAL HIGH (ref 70–99)
Glucose-Capillary: 223 mg/dL — ABNORMAL HIGH (ref 70–99)
Glucose-Capillary: 229 mg/dL — ABNORMAL HIGH (ref 70–99)
Glucose-Capillary: 231 mg/dL — ABNORMAL HIGH (ref 70–99)
Glucose-Capillary: 231 mg/dL — ABNORMAL HIGH (ref 70–99)
Glucose-Capillary: 256 mg/dL — ABNORMAL HIGH (ref 70–99)
Glucose-Capillary: 284 mg/dL — ABNORMAL HIGH (ref 70–99)
Glucose-Capillary: 296 mg/dL — ABNORMAL HIGH (ref 70–99)

## 2020-01-31 LAB — FIBRINOGEN: Fibrinogen: 176 mg/dL — ABNORMAL LOW (ref 210–475)

## 2020-01-31 LAB — HEPARIN LEVEL (UNFRACTIONATED): Heparin Unfractionated: <0.1 IU/mL — ABNORMAL LOW (ref 0.30–0.70)

## 2020-01-31 LAB — MAGNESIUM: Magnesium: 2.6 mg/dL — ABNORMAL HIGH (ref 1.7–2.4)

## 2020-01-31 LAB — LACTATE DEHYDROGENASE
LDH: 1141 U/L — ABNORMAL HIGH (ref 98–192)
LDH: 1217 U/L — ABNORMAL HIGH (ref 98–192)

## 2020-01-31 LAB — PHOSPHORUS: Phosphorus: 6.1 mg/dL — ABNORMAL HIGH (ref 2.5–4.6)

## 2020-01-31 LAB — LACTIC ACID, PLASMA
Lactic Acid, Venous: 1.7 mmol/L (ref 0.5–1.9)
Lactic Acid, Venous: 1.4 mmol/L (ref 0.5–1.9)

## 2020-01-31 MED ORDER — PRISMASOL BGK 4/2.5 32-4-2.5 MEQ/L IV SOLN: INTRAVENOUS | Status: DC

## 2020-01-31 NOTE — Progress Notes (Signed)
OT Cancellation Note  Patient Details Name: Alisha Stephenson MRN: [id number] DOB: Jan 31, 1966   Cancelled Treatment:    Reason Eval/Treat Not Completed: Medical issues which prohibited therapy; pt currently sedated. Will follow up as able.  Lou Cal, OT Acute Rehabilitation Services Pager [phone] Office [phone]   Raymondo Band 01/31/2020, 2:19 PM

## 2020-01-31 NOTE — Progress Notes (Signed)
Advanced Heart Failure Rounding Note   Subjective:    - 8/2 COVID + test - 8/9 Cannulated for VV ECMO - 8/13 with several areas of intracranial hemorrhage. Bival stopped.  - 8/14 CT no change in [place]. Increased edema - 8/16 Extubated - 8/16 Head CT stable bleed - 8/22 CVVHD started - 8/24 reintubated - 8/24 circuit changed  Circuit changed yesterday due to worsening oxygenation and presumed hemolysis. Oxygenation is improved.  LDH climbing. PLTs still low,   Continues to bleed from lungs and IV sites. On vasopressin and escalating doses of norepinephrine. Unable to pull with CVVHD due to hypotension. Weight up ~50 pounds.   Bronch yesterday with bloody secretions and diffuse clotting. LFTs and bili climbing concerning for liver failure  Now on heparin at 400u/hr to protect circuit  ECMO   Flow 5.11L RPM 4200 Sweep8  Labs:  7.40/41/59/92% Hgb8.2 PLT 32k LDH 429 -> 451 -> 576 -> 527 -> 547 -> 655 -> 917 -> 1,141 Heparin level  < 0.10 Lactic acid1.7 Fibrinogen 176  Objective:   Weight Range:  Vital Signs:   Temp:  [94.1 F (34.5 C)-97.3 F (36.3 C)] 95.7 F (35.4 C) (08/25 0800) Pulse Rate:  [66-88] 78 (08/25 0800) Resp:  [14-19] 19 (08/25 0347) BP: (88-128)/(28-60) 124/46 (08/25 0800) SpO2:  [84 %-100 %] 93 % (08/25 0800) Arterial Line BP: (84-191)/(46-69) 132/54 (08/25 0800) FiO2 (%):  [80 %-100 %] 80 % (08/25 0603) Weight:  [134 kg] 134 kg (08/25 0630) Last BM Date: 01/30/20  Weight change: Filed Weights   01/29/20 0341 01/30/20 0500 01/31/20 0630  Weight: (!) 156.5 kg (!) 155.2 kg 134 kg    Intake/Output:   Intake/Output Summary (Last 24 hours) at 01/31/2020 0804 Last data filed at 01/31/2020 0800 Gross per 24 hour  Intake 5247.35 ml  Output 4590 ml  Net 657.35 ml     Physical Exam: General:  Ill appearing. Intubated. Awake but won't follow commands HEENT: + ETT oozing from mout Neck: supple.RIJ ECMO cannula  LSC HD cath with  oozing. Cor: PMI nondisplaced. Regular rate & rhythm. No rubs, gallops or murmurs. Lungs: minimal air movement Abdomen: obese soft, nontender, nondistended. Hypoactive bowel sounds. Extremities: no cyanosis, clubbing, rash, 2-3+ edema R fingers blue Neuro: awake not following commands   Telemetry: Sinus 80-90 Personally reviewed   Labs: Basic Metabolic Panel: Recent Labs  Lab 01/27/20 2221 01/28/20 0308 01/29/20 0451 01/29/20 1941 01/29/20 1722 01/29/20 1749 01/30/20 0324 01/30/20 0538 01/30/20 1817 01/30/20 1817 01/30/20 1818 01/30/20 1958 01/30/20 2314 01/31/20 0300 01/31/20 0448  NA  --    < > 147*   < > 140   < > 141   < > 137   < > 137 137 137 134* 137  K  --    < > 4.8   < > 4.3   < > 4.7   < > 4.5   < > 4.4 4.5 4.2 3.9 4.1  CL  --    < > 109  --  102  --  103  --  102  --   --   --   --  101  --   CO2  --    < > 25  --  25  --  25  --  23  --   --   --   --  22  --   GLUCOSE  --    < > 211*  --  235*  --  164*  --  146*  --   --   --   --  278*  --   BUN  --    < > 151*  --  113*  --  95*  --  77*  --   --   --   --  73*  --   CREATININE  --    < > 1.45*  --  1.26*  --  1.31*  --  1.27*  --   --   --   --  1.26*  --   CALCIUM  --    < > 8.6*   < > 8.2*   < > 8.0*  --  7.5*  --   --   --   --  7.1*  --   MG 3.2*  --  3.3*  --   --   --  2.9*  --   --   --   --   --   --  2.6*  --   PHOS  --   --  5.0*  --   --   --  6.0*  --   --   --   --   --   --  6.1*  --    < > = values in this interval not displayed.    Liver Function Tests: Recent Labs  Lab 01/27/20 0410 01/28/20 0308 01/29/20 0451 01/30/20 0324 01/31/20 0300  AST 40 35 65* 123* 206*  ALT 61* 61* 59* 69* 88*  ALKPHOS 135* 140* 137* 139* 155*  BILITOT 2.0* 1.4* 1.5* 1.8* 1.6*  PROT 5.0* 5.3* 5.4* 5.3* 4.9*  ALBUMIN 2.8* 2.8* 3.2* 2.9* 2.4*   No results for input(s): LIPASE, AMYLASE in the last 168 hours. No results for input(s): AMMONIA in the last 168 hours.  CBC: Recent Labs  Lab  01/29/20 1722 01/29/20 1749 01/29/20 2040 01/29/20 2124 01/30/20 0324 01/30/20 0538 01/30/20 1817 01/30/20 1818 01/30/20 1958 01/30/20 2314 01/31/20 0032 01/31/20 0300 01/31/20 0448  WBC 11.9*  --   --   --  11.3*  --  12.6*  --   --   --  11.0* 11.8*  --   HGB 8.4*   < >   < >  --  8.5*   < > 7.5*   < > 7.8* 7.8* 8.5* 8.2* 7.8*  HCT 28.0*   < >   < >  --  27.3*   < > 23.7*   < > 23.0* 23.0* 25.7* 25.9* 23.0*  MCV 101.8*  --   --   --  101.1*  --  98.8  --   --   --  98.5 98.1  --   PLT PLATELET CLUMPS NOTED ON SMEAR, UNABLE TO ESTIMATE   < >  --  31* 30*  --  30*  --   --   --  34* 32*  --    < > = values in this interval not displayed.    Cardiac Enzymes: No results for input(s): CKTOTAL, CKMB, CKMBINDEX, TROPONINI in the last 168 hours.  BNP: BNP (last 3 results) No results for input(s): BNP in the last 8760 hours.  ProBNP (last 3 results) No results for input(s): PROBNP in the last 8760 hours.    Other results:  Imaging: DG CHEST PORT 1 VIEW  Result Date: 01/30/2020 CLINICAL DATA:  ECMO.  ARDS due to COVID 19 virus. EXAM: PORTABLE CHEST 1 VIEW COMPARISON:  One-view chest x-ray  01/30/2019 FINDINGS: Heart is obscured by airspace disease. The tip of the ECMO catheter is at the level of T12, the inferior cavoatrial junction. Left IJ line, endotracheal tube, and enteric tube are stable. The lungs are diffusely opacified bilaterally. Air bronchograms are noted. IMPRESSION: 1. Stable appearance of diffuse bilateral interstitial and airspace disease, compatible ARDS/infection. 2. The tip of the ECMO catheter is at the level of T12, the inferior cavoatrial junction. Electronically Signed   By: San Morelle M.D.   On: 01/30/2020 14:09   DG CHEST PORT 1 VIEW  Result Date: 01/30/2020 CLINICAL DATA:  Respiratory failure EXAM: PORTABLE CHEST 1 VIEW COMPARISON:  January 29, 2020. FINDINGS: ECMO catheter noted on the right with tip below the diaphragm. Left jugular catheter tip  is in the superior vena cava, stable. Left subclavian catheter tip in the left subclavian vein near the junction with the left innominate vein, stable. Feeding tube tip is below the diaphragm. No pneumothorax. There is diffuse airspace opacity bilaterally, stable. There may be superimposed pleural effusions. Cardiac border difficult to discern given the diffuse airspace opacities. No gross change in cardiac silhouette. IMPRESSION: Tube and catheter positions as described without pneumothorax. Widespread airspace opacity bilaterally with potential superimposed effusions. Appearance essentially stable compared to 1 day prior. Electronically Signed   By: Lowella Grip III M.D.   On: 01/30/2020 08:16   DG Abd Portable 1V  Result Date: 01/30/2020 CLINICAL DATA:  Feeding tube placement. EXAM: PORTABLE ABDOMEN - 1 VIEW COMPARISON:  Plain film of the abdomen dated 02/01/2020. FINDINGS: Weighted tip feeding tube appears appropriately positioned in the stomach with tip directed towards the stomach pylorus/duodenal bulb. ECMO cannula in place. IMPRESSION: Weighted tip feeding tube appears appropriately positioned in the stomach with tip directed towards the stomach pylorus/duodenal bulb. Electronically Signed   By: Franki Cabot M.D.   On: 01/30/2020 11:31   VAS [place] UPPER EXTREMITY ARTERIAL DUPLEX  Result Date: 01/29/2020 UPPER EXTREMITY DUPLEX STUDY Indications: Patient complains of discoloration of three digits on the right              hand. History:     Patient has a history ofCovid positive - acute hypoxic respiratory              failure requiring ECMO.  Performing Technologist: Oda Cogan RDMS, RVT  Examination Guidelines: A complete evaluation includes B-mode imaging, spectral Doppler, color Doppler, and power Doppler as needed of all accessible portions of each vessel. Bilateral testing is considered an integral part of a complete examination. Limited examinations for reoccurring indications may be  performed as noted.  Right Doppler Findings: +---------------+----------+---------+--------+--------+ Site           PSV (cm/s)Waveform StenosisComments +---------------+----------+---------+--------+--------+ Subclavian Dist                                    +---------------+----------+---------+--------+--------+ Brachial Dist  103       triphasic                 +---------------+----------+---------+--------+--------+ Radial Prox    53        triphasic                 +---------------+----------+---------+--------+--------+ Radial Mid     55        triphasic                 +---------------+----------+---------+--------+--------+ Radial Dist  72        triphasic                 +---------------+----------+---------+--------+--------+ Ulnar Prox     75        triphasic                 +---------------+----------+---------+--------+--------+ Ulnar Mid      57        triphasic                 +---------------+----------+---------+--------+--------+ Ulnar Dist     83        biphasic                  +---------------+----------+---------+--------+--------+ Palmar Arch              Patent                    +---------------+----------+---------+--------+--------+   Summary:  Right: Patient right upper extremity arterial system and right        Palmar Arch. *See table(s) above for measurements and observations. Electronically signed by Ruta Hinds MD on 01/29/2020 at 5:11:31 PM.    Final      Medications:     Scheduled Medications: . sodium chloride   Intravenous Once  . sodium chloride   Intravenous Once  . vitamin C  500 mg Per Tube Daily  . B-complex with vitamin C  1 tablet Oral Daily  . chlorhexidine gluconate (MEDLINE KIT)  15 mL Mouth Rinse BID  . Chlorhexidine Gluconate Cloth  6 each Topical Daily  . clonazePAM  1 mg Per Tube Q6H  . docusate  100 mg Per Tube BID  . feeding supplement (PROSource TF)  45 mL Per Tube TID  . fentaNYL  (SUBLIMAZE) injection  50 mcg Intravenous Once  . linagliptin  5 mg Per Tube Daily  . mouth rinse  15 mL Mouth Rinse 10 times per day  . oxyCODONE  10 mg Per Tube Q6H  . pantoprazole (PROTONIX) IV  40 mg Intravenous Daily  . polyethylene glycol  17 g Per Tube Daily  . sennosides  5 mL Per Tube BID  . vecuronium  10 mg Intravenous Once  . zinc sulfate  220 mg Per Tube Daily    Infusions: . sodium chloride 250 mL (01/29/20 1444)  . albumin human 12.5 g (01/28/20 1229)  . albumin human    . amiodarone 30 mg/hr (01/31/20 0800)  . dexmedetomidine (PRECEDEX) IV infusion 1.2 mcg/kg/hr (01/31/20 0800)  . feeding supplement (PIVOT 1.5 CAL) 75 mL/hr at 01/31/20 0800  . fentaNYL infusion INTRAVENOUS 150 mcg/hr (01/31/20 0800)  . heparin 400 Units/hr (01/31/20 0800)  . insulin 11.5 mL/hr at 01/31/20 0800  . milrinone 0.125 mcg/kg/min (01/31/20 0800)  . norepinephrine (LEVOPHED) Adult infusion 14 mcg/min (01/31/20 0800)  . piperacillin-tazobactam (ZOSYN)  IV Stopped (01/31/20 0555)  . prismasol BGK 2/2.5 dialysis solution 2,000 mL/hr at 01/31/20 1601  . prismasol BGK 2/2.5 replacement solution 500 mL/hr at 01/30/20 0932  . prismasol BGK 2/2.5 replacement solution 300 mL/hr at 01/30/20 2227  . vasopressin 0.04 Units/min (01/31/20 0800)    PRN Medications: sodium chloride, acetaminophen (TYLENOL) oral liquid 160 mg/5 mL, albumin human, albuterol, dextrose, fentaNYL, fentaNYL (SUBLIMAZE) injection, guaiFENesin-dextromethorphan, heparin, hydrALAZINE, hydrOXYzine, ondansetron **OR** ondansetron (ZOFRAN) IV, sodium chloride   Assessment/Plan:   1. Acute hypoxic/hypercapneic respiratory failure in setting of severe COVID PNA/ARDS -> VV ECMO - admit 8/2 - intubation 8/3 - has  received actmera (compelted 8/2), remdesivir (completed 8/6) and steroids - failed full vent support with proning/paralytic - Cannulated for VV ECMO on 8/9 - Extubated 8/16. Reintubated 8/24 - CXR unchanged  With diffuse  infiltrates Personally reviewed - Circuit changed 8/24 due to concern for hemolysis and worsening oxygenation. Oxygenation is improved but still with evidence of ongoing hemolysis - On increasing doses of VP & NE for BP support - CVVHD started 8/22 for volume removal and uremia. Unable to pull fluid due to hypotension - Now with evidence of worsening multi-system organ failure (brain, lungs, kidneys, liver) I am increasingly concerned about her prognosis and our ability to get her a meaning outcome - Will d/w further with family this am. We have also reached back out to Palliative Care for their input  2. Enterococcus sepsis - back on high-dose zosyn per ID  3. Intracranial hemorrhage - ? Septic emboli - repeat head CT on 8/14 with stable bleeds but increased edema - neurology following. Suspect significant long-term injury sustained. Will follow commands. Appears to have dense LUE weakness and possibly LLE - repeat head CT stable 8/16 - Now back on low-dose heparin to protect circuit from further clotting. Heparin level undetectable - Consider TEE at some point to further evalaute. Can consider now that she is re-intubated but won't change management currently  4. Thrombocytopenia - PLTs < 40K - initially thought due to sepsis but not improving. ? Circuit related - oozing from HD site - suspect hemolysis from circuit but not improved with circuit change  5. Morbid obesity - Body mass index is 49.38 kg/m.  6. Poorly controlled DM2 - HgBA1c 10.7 - CBGs remain elevated - On IV insulin adjust as needed  6. Hypernatremia - Na 134.Improving with HD.   7. Lactic acidosis -  improving  8. PAF - 2 episodes of AF with RVR on 8/21 - back in NSR on IV amio this am. Continue   9. AKI/azotemia - CVVHD started on 8/22 - Discussed with Renal this am. Will continue  10. Ischemic R fingers - ? Due to septic embolic versus vascular issue - check /us. Consult VVS  11. Acute liver  failure - worsening LFTs and bili - has underlying fatty liver - will fractionate bili to further evaluate   CRITICAL CARE Performed by: Glori Bickers  Total critical care time: 45 minutes  Critical care time was exclusive of separately billable procedures and treating other patients.  Critical care was necessary to treat or prevent imminent or life-threatening deterioration.  Critical care was time spent personally by me (independent of midlevel providers or residents) on the following activities: development of treatment plan with patient and/or surrogate as well as nursing, discussions with consultants, evaluation of patient's response to treatment, examination of patient, obtaining history from patient or surrogate, ordering and performing treatments and interventions, ordering and review of laboratory studies, ordering and review of radiographic studies, pulse oximetry and re-evaluation of patient's condition.   Length of Stay: 23   Glori Bickers  MD 01/31/2020, 8:04 AM  Advanced Heart Failure Team Pager [phone] (M-F; [place])  Please contact [facility] for night-coverage after hours (4p -7a ) and weekends on [email or web address]

## 2020-01-31 NOTE — Progress Notes (Signed)
Visitation exception made for family 01/31/20 for 3 visitors in the room while goals of care conversations are being held. Exception only for 01/31/20. Family aware of rules and that it will return to one visitor per day 02/01/20

## 2020-01-31 NOTE — Progress Notes (Signed)
NAME:  Alisha Stephenson, MRN:  [id number], DOB:  1965-11-07, LOS: 93 ADMISSION DATE:  01/16/2020, CONSULTATION DATE:  8/3 REFERRING MD:  Alfredia Ferguson, CHIEF COMPLAINT:  Dyspnea   Brief History   54 y/o female admitted on 8/2 with severe acute respiratory failure with hypoxemia due to COVID 19 pneumonia.  She developed symptoms 1 week prior to admission.  Past Medical History  DM2 Diverticulitis Gallstones Ovarian cyst NAFLD Asthma  Significant Hospital Events   8/2 admit 8/3 ICU transfer, intubated 8/4 prone, paralyze 8/9 significant desaturations today 8/9 VV ECMO cannulation 8/13 head bleed  Consults:  PCCM ECMO team    Procedures:  8/3 ETT >  8/3 PICC >  8/9 LIJ MML 8/9 RIJ Crescent 47F   Significant Diagnostic Tests:  7/31 CT head > NAICP 7/31 MRI/MRA brain > no acute changes, possibly small aneurysm ACOM 8/13 CT head> multiple areas of ICH  Micro Data:  8/2 blood > NG 8/2 SARS COV 2 > positive 8/4 resp > negative 8/4 urine >  8/12 blood > E. faecalis (pan-sensitive) 8/12 resp: staph aureus> MSSA  Antimicrobials:  8/2 remdesivir > 8/6 8/2 actemra  8/2 solumedrol >   8/3 ceftriaxone >  8/5 8/3 azithro >  8/5  8/12 meropenem>8/16 8/12 vanc>8/16  8/13 zosyn >> 8/20 , restart 8/23 Vanc 8/19>> 8/22 Fluconazole 8/19>> 8/22 Amp & ceftriaxone  8/22-8/23  Interim history/subjective:  Some bleeding overnight from HD catheter- resolved with pressure. Remains intubated, lightly sedated. Limited in UF on CRRT yesterday due to Bps.    Objective   Blood pressure (!) 116/50, pulse 73, temperature (!) 95.7 F (35.4 C), resp. rate 19, height _0  (1.626 m), weight 134 kg, SpO2 98 %. CVP:  [16 mmHg-28 mmHg] 20 mmHg  Vent Mode: PCV FiO2 (%):  [80 %-100 %] 80 % Set Rate:  [15 bmp] 15 bmp PEEP:  [12 cmH20] 12 cmH20 Plateau Pressure:  [23 cmH20-25 cmH20] 24 cmH20   Intake/Output Summary (Last 24 hours) at 01/31/2020 0705 Last data filed at 01/31/2020 0700 Gross per  24 hour  Intake 5235.26 ml  Output 4696 ml  Net 539.26 ml   Filed Weights   01/29/20 0341 01/30/20 0500 01/31/20 0630  Weight: (!) 156.5 kg (!) 155.2 kg 134 kg    Examination:  GEN: critically ill appearing woman laying in bed in NAD, intubated, lightly sedated HEENT: [place]/AT, scleral edema CV: RRR, no murmurs PULM: Breathing synchronously on the vent. Bloody secretions from ETT. GI: obese, soft, NT, ND EXT: diffuse anasarca, persistent cyanosis but warm distal thumb and ring finger on right NEURO: RASS -3. Nodding, tracking, not following commands. SKIN:  Cyanosis as above.    Labs reviewed- worse LFTs, platelets essentially unchanged, fibrinogen better CXR 8/25 personally reviewed: No lung aeration. ETT in appropriate position.    Resolved Hospital Problem list     Assessment & Plan:  Acute hypoxic and AoC hypercapneic respiratory failure; likely underly OHS (baseline bicarb 27-30) ARDS due to COVID 19 pneumonia   VV ECMO cannulation on 8/9 for refractory hypoxemia MSSA pneumonia -Continue VV ECMO. Circuit change on 8/24, cannula pulled back. -Con't MV. Not a candidate for extubation again unless she can cough and clear secretions adequately. -low dose heparin to prevent clotting in new circuit  Enterococcal bacteremia-recent blood cultures cleared Concern for possible embolic phenomenon- fingers and possibly CVAs were embolic  -Appreciate infectious disease team's assistance.  Back on zosyn due to concern for bioavailability of drug on ECMO. -Right  radial Doppler ultrasound and vascular surgery consult today. -Reculture today; change foley first.  Elevated LDH, hypo-fibrinogenemia and thrombocytopenia; most likely due to dedication side effect versus sepsis versus chronic hepatic disease less likely. Hb stable and low platelets but no schistocytes on smear suggest DIC is not cause.   -Continue to monitor platelet count -Discontinue quetiapine.  Vancomycin, penicillin  based antibiotics also potentially cause thrombocytopenia, but B-lactam antibiotics cannot be discontinued at this time.  ICH, multifocal. L-sided weakness.  Encephalopathy- ICU delirium + ischemic injury + azotemia - Con't CRRT - limit sedation as much as possible - reorientation - PT & OT when able  Acute renal failure, severe uremia -Continue CRRT; goal net negative -Strict I's/O  -Renally dose medications and avoid nephrotoxic meds.  Shock related to sedation vs sepsis and RV stunning- milrinone and PRN norepinephrine Septic shock- Enterococcus bacteremia Increased WBC, hypotension and question sepsis 8/19, question of septic emboli to explain head bleeds -ID following- appreciate their assistance -reculture; BAL culture from 8/24 pending  -NE, vasopressin to maintain MAP >65  Hyperglycemia > not controlled at all with [place] insulin despite aggressive upward titration.  Assume poor absorption, potentially due to subcutaneous edema.   Remains elevated on IV. -To remain on insulin infusion -Goal BG 140-180 while admitted to the ICU -Continue linagliptin to decrease insulin requirements  Constipation, resolved  Hypernatremia; resolved -ok to let sodium normalize  Acute anemia due to critical illness Acute thrombocytopenia- refractory to platelet transfusion, not better after circuit change Coagulopathy- concern for underlying liver dysfunction -transfuse for Hb <8 -con't to monitor   Hyperbilirubinemia (increased direct, borderline indirect), elevated transaminases coagulopathy Concern for progressive MOF -Palliative Care consult. Updated family with Cardiology regarding our concern for progressive organ failure despite maximal aggressive therapy.   Best practice:  Diet: tube feeding Pain/Anxiety/Delirium protocol (if indicated): as above VAP protocol (if indicated): yes DVT prophylaxis: SCDs , very low dose heparin GI prophylaxis: pantoprazole Glucose control:  insulin  infusion  Mobility: bed rest Code Status: limited Family Communication: husband [name] updated via phone; coming for a visit today with team and palliative care Disposition: ICU  This patient is critically ill with multiple organ system failure which requires frequent high complexity decision making, assessment, support, evaluation, and titration of therapies. This was completed through the application of advanced monitoring technologies and extensive interpretation of multiple databases. During this encounter critical care time was devoted to patient care services described in this note for 53 minutes.   Julian Hy, DO 01/31/20 8:43 AM [facility]

## 2020-01-31 NOTE — Progress Notes (Signed)
[facility] NEPHROLOGY PROGRESS NOTE  Assessment/ Plan: Pt is a 54 y.o. yo female  With PMH of DM2, obesity, NAFLD who present w/ acute hypoxic respiratory failure due to Covid.  Hospitalization complicated by severe shock, refractory hypoxia requiring ECMO, multiple infections, intracranial hemorrhage, encephalopathy, AKI.  # Anuric AKI due to shock, sepsis causing acute tubular necrosis: Started CRRT on 8/22 for uremia and volume overload.  She has ECMO. No clinical improvement.  She has fluid overload and gained significant amount of weight.  Unable to ultrafiltrate because of hypotension.  She is on multiple pressors and inotropes.  She now has multiple organ failure.  Discussed with the heart failure team today, agree with goals of care discussion and palliative care consult.  #Shock due to sepsis, Covid infection, Enterococcus bacteremia: On pressors, antibiotics per primary team.  #Acute hypoxic and hypercapnic respiratory failure, ARDS due to COVID-19 pneumonia: Currently on ECMO for refractory hypoxemia, MSSA pneumonia.  On broad-spectrum antibiotics.   #Hypernatremia, hypervolemia: relative free water deficit.  Improved with CRRT.  #Acute toxic metabolic encephalopathy: Multifactorial with uremia contributing.  Treatment with CRRT as above.  #Anemia, thrombocytopenia: With hemolysis likely and acute illness contributing.  Transfusions per primary team.  Hematology is following.  #Intracranial hemorrhage: With some left-sided weakness preceded by primary team.  Avoiding heparin products as able.  Discussed with the cardiology team. Poor prognosis  Subjective: Seen and examined.  CRRT and ECMO running.  No urine output.  Running CRRT however unable to ultrafiltrate.  Objective Vital signs in last 24 hours: Vitals:   01/31/20 0645 01/31/20 0700 01/31/20 0800 01/31/20 0802  BP:  (!) 108/49 (!) 124/46   Pulse: 73 73 78 78  Resp:   (!) 36 (!) 30  Temp: (!) 95.7  F (35.4 C) (!) 95.7 F (35.4 C) 97.7 F (36.5 C)   TempSrc:   Oral   SpO2: 97% 98% 93% 93%  Weight:      Height:       Weight change: -21.2 kg  Intake/Output Summary (Last 24 hours) at 01/31/2020 0916 Last data filed at 01/31/2020 0900 Gross per 24 hour  Intake 5273.66 ml  Output 4778 ml  Net 495.66 ml       Labs: Basic Metabolic Panel: Recent Labs  Lab 01/29/20 0451 01/29/20 0637 01/30/20 0324 01/30/20 0538 01/30/20 1817 01/30/20 1818 01/31/20 0300 01/31/20 0448 01/31/20 0819  NA 147*   < > 141   < > 137   < > 134* 137 136  K 4.8   < > 4.7   < > 4.5   < > 3.9 4.1 4.2  CL 109   < > 103  --  102  --  101  --   --   CO2 25   < > 25  --  23  --  22  --   --   GLUCOSE 211*   < > 164*  --  146*  --  278*  --   --   BUN 151*   < > 95*  --  77*  --  73*  --   --   CREATININE 1.45*   < > 1.31*  --  1.27*  --  1.26*  --   --   CALCIUM 8.6*   < > 8.0*  --  7.5*  --  7.1*  --   --   PHOS 5.0*  --  6.0*  --   --   --  6.1*  --   --    < > =  values in this interval not displayed.   Liver Function Tests: Recent Labs  Lab 01/29/20 0451 01/30/20 0324 01/31/20 0300  AST 65* 123* 206*  ALT 59* 69* 88*  ALKPHOS 137* 139* 155*  BILITOT 1.5* 1.8* 1.6*  PROT 5.4* 5.3* 4.9*  ALBUMIN 3.2* 2.9* 2.4*   No results for input(s): LIPASE, AMYLASE in the last 168 hours. No results for input(s): AMMONIA in the last 168 hours. CBC: Recent Labs  Lab 01/29/20 1722 01/29/20 1749 01/30/20 0324 01/30/20 0538 01/30/20 1817 01/30/20 1818 01/31/20 0032 01/31/20 0032 01/31/20 0300 01/31/20 0448 01/31/20 0819  WBC 11.9*   < > 11.3*   < > 12.6*  --  11.0*  --  11.8*  --   --   HGB 8.4*   < > 8.5*   < > 7.5*   < > 8.5*   < > 8.2* 7.8* 9.2*  HCT 28.0*   < > 27.3*   < > 23.7*   < > 25.7*   < > 25.9* 23.0* 27.0*  MCV 101.8*  --  101.1*  --  98.8  --  98.5  --  98.1  --   --   PLT PLATELET CLUMPS NOTED ON SMEAR, UNABLE TO ESTIMATE   < > 30*   < > 30*  --  34*  --  32*  --   --    < > =  values in this interval not displayed.   Cardiac Enzymes: No results for input(s): CKTOTAL, CKMB, CKMBINDEX, TROPONINI in the last 168 hours. CBG: Recent Labs  Lab 01/31/20 0257 01/31/20 0426 01/31/20 0608 01/31/20 0704 01/31/20 0816  GLUCAP 256* 231* 213* 231* 223*    Iron Studies: No results for input(s): IRON, TIBC, TRANSFERRIN, FERRITIN in the last 72 hours. Studies/Results: DG CHEST PORT 1 VIEW  Result Date: 01/31/2020 CLINICAL DATA:  Respiratory failure EXAM: PORTABLE CHEST 1 VIEW COMPARISON:  January 30, 2020 FINDINGS: Endotracheal tube tip is 4.5 cm above the carina. ECMO catheter present on the right with tip below diaphragm. Feeding tube tip is below the diaphragm. Left jugular catheter tip is in the superior vena cava. No pneumothorax. There is persistent diffuse airspace opacity bilaterally with suspected underlying pleural effusions. Grossly stable cardiac silhouette. IMPRESSION: Tube and catheter positions as described without pneumothorax. Diffuse airspace opacity bilaterally with suspected associated pleural effusions. Appearance indicative of ARDS superimposed with infection and/or edema. No appreciable change from 1 day prior. Electronically Signed   By: Lowella Grip III M.D.   On: 01/31/2020 08:24   DG CHEST PORT 1 VIEW  Result Date: 01/30/2020 CLINICAL DATA:  ECMO.  ARDS due to COVID 19 virus. EXAM: PORTABLE CHEST 1 VIEW COMPARISON:  One-view chest x-ray 01/30/2019 FINDINGS: Heart is obscured by airspace disease. The tip of the ECMO catheter is at the level of T12, the inferior cavoatrial junction. Left IJ line, endotracheal tube, and enteric tube are stable. The lungs are diffusely opacified bilaterally. Air bronchograms are noted. IMPRESSION: 1. Stable appearance of diffuse bilateral interstitial and airspace disease, compatible ARDS/infection. 2. The tip of the ECMO catheter is at the level of T12, the inferior cavoatrial junction. Electronically Signed   By:  San Morelle M.D.   On: 01/30/2020 14:09   DG CHEST PORT 1 VIEW  Result Date: 01/30/2020 CLINICAL DATA:  Respiratory failure EXAM: PORTABLE CHEST 1 VIEW COMPARISON:  January 29, 2020. FINDINGS: ECMO catheter noted on the right with tip below the diaphragm. Left jugular catheter tip is in  the superior vena cava, stable. Left subclavian catheter tip in the left subclavian vein near the junction with the left innominate vein, stable. Feeding tube tip is below the diaphragm. No pneumothorax. There is diffuse airspace opacity bilaterally, stable. There may be superimposed pleural effusions. Cardiac border difficult to discern given the diffuse airspace opacities. No gross change in cardiac silhouette. IMPRESSION: Tube and catheter positions as described without pneumothorax. Widespread airspace opacity bilaterally with potential superimposed effusions. Appearance essentially stable compared to 1 day prior. Electronically Signed   By: Lowella Grip III M.D.   On: 01/30/2020 08:16   DG Abd Portable 1V  Result Date: 01/30/2020 CLINICAL DATA:  Feeding tube placement. EXAM: PORTABLE ABDOMEN - 1 VIEW COMPARISON:  Plain film of the abdomen dated 01/30/2020. FINDINGS: Weighted tip feeding tube appears appropriately positioned in the stomach with tip directed towards the stomach pylorus/duodenal bulb. ECMO cannula in place. IMPRESSION: Weighted tip feeding tube appears appropriately positioned in the stomach with tip directed towards the stomach pylorus/duodenal bulb. Electronically Signed   By: Franki Cabot M.D.   On: 01/30/2020 11:31   VAS [place] UPPER EXTREMITY ARTERIAL DUPLEX  Result Date: 01/29/2020 UPPER EXTREMITY DUPLEX STUDY Indications: Patient complains of discoloration of three digits on the right              hand. History:     Patient has a history ofCovid positive - acute hypoxic respiratory              failure requiring ECMO.  Performing Technologist: Oda Cogan RDMS, RVT  Examination  Guidelines: A complete evaluation includes B-mode imaging, spectral Doppler, color Doppler, and power Doppler as needed of all accessible portions of each vessel. Bilateral testing is considered an integral part of a complete examination. Limited examinations for reoccurring indications may be performed as noted.  Right Doppler Findings: +---------------+----------+---------+--------+--------+ Site           PSV (cm/s)Waveform StenosisComments +---------------+----------+---------+--------+--------+ Subclavian Dist                                    +---------------+----------+---------+--------+--------+ Brachial Dist  103       triphasic                 +---------------+----------+---------+--------+--------+ Radial Prox    53        triphasic                 +---------------+----------+---------+--------+--------+ Radial Mid     55        triphasic                 +---------------+----------+---------+--------+--------+ Radial Dist    72        triphasic                 +---------------+----------+---------+--------+--------+ Ulnar Prox     75        triphasic                 +---------------+----------+---------+--------+--------+ Ulnar Mid      57        triphasic                 +---------------+----------+---------+--------+--------+ Ulnar Dist     83        biphasic                  +---------------+----------+---------+--------+--------+ Palmar Arch  Patent                    +---------------+----------+---------+--------+--------+   Summary:  Right: Patient right upper extremity arterial system and right        Palmar Arch. *See table(s) above for measurements and observations. Electronically signed by Ruta Hinds MD on 01/29/2020 at 5:11:31 PM.    Final     Medications: Infusions: . sodium chloride 250 mL (01/29/20 1444)  . albumin human 12.5 g (01/28/20 1229)  . albumin human    . amiodarone 30 mg/hr (01/31/20 0900)  .  dexmedetomidine (PRECEDEX) IV infusion 1.2 mcg/kg/hr (01/31/20 0900)  . feeding supplement (PIVOT 1.5 CAL) 75 mL/hr at 01/31/20 0800  . fentaNYL infusion INTRAVENOUS 200 mcg/hr (01/31/20 0900)  . heparin 400 Units/hr (01/31/20 0900)  . insulin 13 mL/hr at 01/31/20 0900  . milrinone 0.125 mcg/kg/min (01/31/20 0900)  . norepinephrine (LEVOPHED) Adult infusion 16 mcg/min (01/31/20 0900)  . piperacillin-tazobactam (ZOSYN)  IV Stopped (01/31/20 0555)  . prismasol BGK 2/2.5 dialysis solution 2,000 mL/hr at 01/31/20 0912  . prismasol BGK 2/2.5 replacement solution 500 mL/hr at 01/30/20 2060  . prismasol BGK 2/2.5 replacement solution 300 mL/hr at 01/30/20 2227  . vasopressin 0.04 Units/min (01/31/20 0900)    Scheduled Medications: . sodium chloride   Intravenous Once  . sodium chloride   Intravenous Once  . vitamin C  500 mg Per Tube Daily  . B-complex with vitamin C  1 tablet Oral Daily  . chlorhexidine gluconate (MEDLINE KIT)  15 mL Mouth Rinse BID  . Chlorhexidine Gluconate Cloth  6 each Topical Daily  . clonazePAM  1 mg Per Tube Q6H  . docusate  100 mg Per Tube BID  . feeding supplement (PROSource TF)  45 mL Per Tube TID  . fentaNYL (SUBLIMAZE) injection  50 mcg Intravenous Once  . linagliptin  5 mg Per Tube Daily  . mouth rinse  15 mL Mouth Rinse 10 times per day  . oxyCODONE  10 mg Per Tube Q6H  . pantoprazole (PROTONIX) IV  40 mg Intravenous Daily  . polyethylene glycol  17 g Per Tube Daily  . sennosides  5 mL Per Tube BID  . vecuronium  10 mg Intravenous Once  . zinc sulfate  220 mg Per Tube Daily    have reviewed scheduled and prn medications.  Physical Exam: General: Critically ill female, sedated, has ECMO running. Heart:RRR, s1s2 nl Lungs: Coarse breath sound bilateral Abdomen:soft, non-distended Extremities: Anasarca and all extremities has edema. Dialysis Access: Temporary HD catheter  Alisha Stephenson 01/31/2020,9:16 AM  LOS: 23 days  Pager: [phone]

## 2020-01-31 NOTE — Progress Notes (Signed)
Chaplain engaged in initial visit with [name]'s husband, daughters, mother, and son-in-law.  Chaplain provided support as they visited their mother in preparation for a family meeting with palliative care.  [name] shared his faith and theology with chaplain and his belief that his wife is in God's hands.  Chaplain offered the ministries of presence and listening.

## 2020-01-31 NOTE — Progress Notes (Signed)
Fentanyl 5000 mcg/100 ml bag was wasted by June Wendee Hata, RN and Jaynie Bream, RN.  Wasted 100 mls in stericycle.

## 2020-01-31 NOTE — CV Procedure (Signed)
ECMO NOTE:  Indication: Respiratory failure due to COVID PNA  Initial cannulation date: 01/07/2020  ECMO type: VV ECMO (Centrimag with oximizer)  Dual lumen Inflow/return cannula: 32 FR RIJ [place] placed 01/14/2020  ECMO events:  - Initial cannulation 01/24/2020 - Cannula repositioned (pulled back ) 01/17/20 - Off bival 8/13 due to [place] - circuit change 8/24 and cannula pulled back  Daily data:  Flow4.94 RPM 4200 Sweep 8  Labs:  ABG    Component Value Date/Time   PHART 7.401 01/31/2020 0448   PCO2ART 41.4 01/31/2020 0448   PO2ART 162 (H) 01/31/2020 0448   HCO3 26.1 01/31/2020 0448   TCO2 27 01/31/2020 0448   ACIDBASEDEF 1.0 01/27/2020 1954   O2SAT 99.0 01/31/2020 0448     BG remain high despite insulin drip BUN/Cr improved on CRRT LDH 547> 655> 917> 1141 Fibrinogen back down below 60-- remains there since 8/19>> 90 > 176 INR 2.5>>1.9>>2.0> 2.0> 1.6> 1.4 PTT 38 HgB 8.5>>8.2>>8.7> 8.8> 7.8 Plts 34>>37>>35> 30> 29> 31>30> 32 WBC 13>>11>>11> 9.9> 7.9> 9.3> 11.3> 11.8   Assessment: -Severe COVID ARDS s/p VV ECMO -Enterococcal bacteremia> cleared -MSSA pneumonia; repeat cultures pending -Thrombocytopenia, refractory- some combination of hemolysis, bone marrow stunning, and sepsis effect, possibly contribution of chronic liver dysfunction remains likely - Intracerebral hemorrhage with question of septic emboli; on very low dose AC with new circuit - Muscular deconditioning - Acute kidney failure requiring CVVHD -Likely ischemic digits due to embolic phenomenon on right upper extremity -progressive MOF  Plan: -Continue VV ECMO support. No significant change in thrombocytopenia post-circuit change.  -Con't MV -Low-dose heparin for circuit change and to prevent clotting of new circuit -Continue CVVHD -Continue insulin gtt-does not absorb subcu insulin -zosyn per ID, TEE at some point when safer.  Continue empirically treating for endocarditis given concern  for vascular emboli. -Limited code, daily discussions with family. Family visit and palliative care reconsulted today. -off covid isolation due to cleared viral load  Multidisciplinary rounds with cardiology, critical care medicine, cardiothoracic surgery, coordinator, PharmDs, RTs, and all nurses present.  Julian Hy, DO 01/31/20 8:53 AM [facility]

## 2020-01-31 NOTE — Plan of Care (Signed)
Problem: Education: Goal: Knowledge of General Education information will improve Description: Including pain rating scale, medication(s)/side effects and non-pharmacologic comfort measures Outcome: Progressing   Problem: Health Behavior/Discharge Planning: Goal: Ability to manage health-related needs will improve Outcome: Progressing   Problem: Clinical Measurements: Goal: Ability to maintain clinical measurements within normal limits will improve Outcome: Progressing Goal: Will remain free from infection Outcome: Progressing Goal: Diagnostic test results will improve Outcome: Progressing Goal: Respiratory complications will improve Outcome: Progressing Goal: Cardiovascular complication will be avoided Outcome: Progressing   Problem: Activity: Goal: Risk for activity intolerance will decrease Outcome: Progressing   Problem: Nutrition: Goal: Adequate nutrition will be maintained Outcome: Progressing   Problem: Coping: Goal: Level of anxiety will decrease Outcome: Progressing   Problem: Elimination: Goal: Will not experience complications related to bowel motility Outcome: Progressing Goal: Will not experience complications related to urinary retention Outcome: Progressing   Problem: Pain Managment: Goal: General experience of comfort will improve Outcome: Progressing   Problem: Safety: Goal: Ability to remain free from injury will improve Outcome: Progressing   Problem: Skin Integrity: Goal: Risk for impaired skin integrity will decrease Outcome: Progressing   Problem: Education: Goal: Knowledge of risk factors and measures for prevention of condition will improve Outcome: Progressing   Problem: Coping: Goal: Psychosocial and spiritual needs will be supported Outcome: Progressing   Problem: Respiratory: Goal: Will maintain a patent airway Outcome: Progressing Goal: Complications related to the disease process, condition or treatment will be avoided or  minimized Outcome: Progressing   Problem: Education: Goal: Knowledge of risk factors and measures for prevention of condition will improve Outcome: Progressing   Problem: Coping: Goal: Psychosocial and spiritual needs will be supported Outcome: Progressing   Problem: Respiratory: Goal: Will maintain a patent airway Outcome: Progressing Goal: Complications related to the disease process, condition or treatment will be avoided or minimized Outcome: Progressing   Problem: Education: Goal: Ability to describe self-care measures that may prevent or decrease complications (Diabetes Survival Skills Education) will improve Outcome: Progressing Goal: Individualized Educational Video(s) Outcome: Progressing   Problem: Coping: Goal: Ability to adjust to condition or change in health will improve Outcome: Progressing   Problem: Fluid Volume: Goal: Ability to maintain a balanced intake and output will improve Outcome: Progressing   Problem: Health Behavior/Discharge Planning: Goal: Ability to identify and utilize available resources and services will improve Outcome: Progressing Goal: Ability to manage health-related needs will improve Outcome: Progressing   Problem: Metabolic: Goal: Ability to maintain appropriate glucose levels will improve Outcome: Progressing   Problem: Nutritional: Goal: Maintenance of adequate nutrition will improve Outcome: Progressing Goal: Progress toward achieving an optimal weight will improve Outcome: Progressing   Problem: Skin Integrity: Goal: Risk for impaired skin integrity will decrease Outcome: Progressing   Problem: Tissue Perfusion: Goal: Adequacy of tissue perfusion will improve Outcome: Progressing   Problem: Education: Goal: Knowledge of risk factors and measures for prevention of condition will improve Outcome: Progressing   Problem: Coping: Goal: Psychosocial and spiritual needs will be supported Outcome: Progressing    Problem: Respiratory: Goal: Will maintain a patent airway Outcome: Progressing Goal: Complications related to the disease process, condition or treatment will be avoided or minimized Outcome: Progressing   Problem: Education: Goal: Knowledge of disease or condition will improve Outcome: Progressing Goal: Knowledge of secondary prevention will improve Outcome: Progressing Goal: Knowledge of patient specific risk factors addressed and post discharge goals established will improve Outcome: Progressing Goal: Individualized Educational Video(s) Outcome: Progressing   Problem: Coping: Goal: Will verbalize  positive feelings about self Outcome: Progressing Goal: Will identify appropriate support needs Outcome: Progressing   Problem: Health Behavior/Discharge Planning: Goal: Ability to manage health-related needs will improve Outcome: Progressing   Problem: Self-Care: Goal: Ability to participate in self-care as condition permits will improve Outcome: Progressing Goal: Verbalization of feelings and concerns over difficulty with self-care will improve Outcome: Progressing Goal: Ability to communicate needs accurately will improve Outcome: Progressing   Problem: Nutrition: Goal: Risk of aspiration will decrease Outcome: Progressing Goal: Dietary intake will improve Outcome: Progressing   Problem: Intracerebral Hemorrhage Tissue Perfusion: Goal: Complications of Intracerebral Hemorrhage will be minimized Outcome: Progressing   Problem: Ischemic Stroke/TIA Tissue Perfusion: Goal: Complications of ischemic stroke/TIA will be minimized Outcome: Progressing   Problem: Spontaneous Subarachnoid Hemorrhage Tissue Perfusion: Goal: Complications of Spontaneous Subarachnoid Hemorrhage will be minimized Outcome: Progressing

## 2020-01-31 NOTE — Progress Notes (Signed)
Dr [name] and Dr Haroldine Laws made aware of increased discoloration of L hand, hypothermia despite efforts with Bair Hugger and CRRT warmer, and increased WBC count. No new orders at this time.

## 2020-01-31 NOTE — Progress Notes (Signed)
Brief Hematology Progress Note  HPI: Alisha Stephenson 54 y.o. female with medical history significant for DM type II, IBS, NAFLD, and asthma who is currently admitted to the CCU on ECMO for COVID infection. Hematology has been consulted for thrombocytopenia.   S: ECMO circuit exchanged today. No appreciable bump in platelets following transfusion on 01/29/2020. Patient started on heparin. Plt today 41. Fingers becoming more dusky. [place] talks in progress.  A/P:  #Thrombocytopenia in Setting of Critical Illness --the cause of this patient's thrombocytopenia is likely multifactorial in nature and includes critical illness, antibiotics, systemic inflammation, and likely component of DIC/ clotting of ECMO circuit --fibrinogen noted to be <60, supportive of clotting in prior circuit. Additionally immature platelet fraction elevation supports consumption in the setting of her line clotting.  --personal review of peripheral blood film showed no clumping/schistocytes --transfusion goal of Platelets >20 in setting of stable ICH --agree with goals of care discussions in the setting of critical illness and multiorgan failure.  --Hematology will continue to be available for questions and concerns regarding her care   Ledell Peoples, MD Department of Hematology/Oncology [place] at [facility] Phone: [phone] Pager: [phone] Email: [email or web address]

## 2020-01-31 NOTE — Progress Notes (Addendum)
Daily Progress Note   Patient Name: Alisha Stephenson       Date: 01/31/2020 DOB: 01-07-1966  Age: 54 y.o. MRN#: [id number] Attending Physician: Julian Hy, DO Primary Care Physician: Shelda Pal, DO Admit Date: 01/21/2020    Family Discussion: 01/31/20   Participants: Alisha Stephenson (spouse), Alisha Stephenson (daughter), Alisha Stephenson (daughter), son-in-law, brother, sister-in-law    Dr. Haroldine Laws, Dr. [name] and myself met with patient's family in the [facility] consultation room. Patient's hospital course and current condition was reviewed in detail with the family. Family expresses understanding that patient is unfortunately developing multi-organ failure despite ongoing intensive medical treatment and full mechanical support. Discussion was had regarding increasingly low chance of meaningful recovery. Family indicates that she would want to continue with all current interventions despite potential outcomes. Families additional questions were further answered.    Discussed the importance of continued conversation with family and the medical providers regarding overall plan of care and treatment, ensuring decisions are within the patients values and GOCs.  This NP provided counseling, education, and emotional support before and after the meeting.    Length of Stay: 23        Vital Signs: BP (!) 146/50   Pulse 86   Temp (!) 96.3 F (35.7 C)   Resp 18   Ht 5' 4"  (1.626 m)   Wt 134 kg   SpO2 92%   BMI 50.71 kg/m  SpO2: SpO2: 92 % O2 Device: O2 Device: Ventilator O2 Flow Rate: O2 Flow Rate (L/min): 30 L/min  LBM: Last BM Date: 01/31/20 Baseline Weight: Weight: (!) 122.5 kg Most recent weight: Weight: 134 kg   Patient Active Problem List   Diagnosis Date Noted  . Personal history of ECMO    . Palliative care by specialist   . Goals of care, counseling/discussion   . Intracerebral hemorrhage 01/19/2020  . Pneumonia due to COVID-19 virus 01/24/2020  . Acute respiratory distress syndrome (ARDS) due to COVID-19 virus ([place]) 01/28/2020  . Diabetes mellitus type 1.5, managed as type 1 ([place]) 10/12/2018  . NAFLD (nonalcoholic fatty liver disease)   . Seborrheic keratoses 03/23/2018  . Allergic rhinitis 11/14/2014  . Asthma in adult 11/14/2014  . IBS (irritable bowel syndrome) 11/14/2014    Palliative Care Assessment & Plan   Patient Profile: HPI/Patient Profile:54 y.o.femalewith past medical  history of DM type 2, asthma, diverticulitis, and NAFLD. She tested positive for Covid-19 on 12/31/19 and at that time was having symptoms of diarrhea, cough, and congestion. She presented to the [facility] ED on 01/06/20 with headache, MRI was negative and she was sent home. She presented again to the [facility] 8/2/2021with dyspnea.In the ED, she required high flow oxygen to maintain O2 saturation, chest x-ray showed infiltrates concerning for pneumonia. She was started on actemra, steroids, and remdesivir; admitted to [facility].Her respiratory status rapidly declined requiring intubation. She developed severe ARDS secondary to Covid pneumonia, requiring prone positioning, paralytics, and VV ECMO. Hospital course complicated by multi-system organ failure (thrombocytopenia, AKI, shock liver) as well as enterococcal bacteremia.   Assessment: - severe ARDS secondary to COVID 19 pneumonia - acute hypoxic and hypercapneic respiratory failure  - VV ECMO for refractory hypoxemia - intracranial hemorrhage (stable) - acute renal failure requiring CRRT - shock requiring vasopressors - hyperglycemia requiring insulin infusion - acute thrombocytopenia - acute liver failure  Recommendations/Plan: - continue current interventions and support - PMT will continue to follow and  check in with family as needed, please call if there are more urgent needs  Code Status: No CPR or defib   Thank you for allowing the Palliative Medicine Team to assist in the care of this patient.   Total Time 65 minutes Prolonged Time Billed  no      Greater than 50%  of this time was spent counseling and coordinating care related to the above assessment and plan.  Lavena Bullion, NP  Please contact Palliative Medicine Team phone at [phone] for questions and concerns.

## 2020-01-31 NOTE — Progress Notes (Signed)
ANTICOAGULATION CONSULT NOTE - [place] for Heparin Indication: ECMO  Allergies  Allergen Reactions  . Sulfa Antibiotics Rash and Other (See Comments)    Patient Measurements: Height: 5\' 4"  (162.6 cm) Weight: 134 kg (295 lb 6.7 oz) IBW/kg (Calculated) : 54.7 Heparin Dosing Weight: 87 kg  Vital Signs: Temp: 97.7 F (36.5 C) (08/25 0800) Temp Source: Oral (08/25 0800) BP: 124/46 (08/25 0800) Pulse Rate: 78 (08/25 0802)  Labs: Recent Labs    01/29/20 0451 01/29/20 8299 01/30/20 0324 01/30/20 0538 01/30/20 1817 01/30/20 1818 01/31/20 0032 01/31/20 0032 01/31/20 0258 01/31/20 0300 01/31/20 0300 01/31/20 0448 01/31/20 0819  HGB 8.6*   < > 8.5*   < > 7.5*   < > 8.5*   < >  --  8.2*   < > 7.8* 9.2*  HCT 28.6*   < > 27.3*   < > 23.7*   < > 25.7*   < >  --  25.9*  --  23.0* 27.0*  PLT 29*   < > 30*   < > 30*  --  34*  --   --  32*  --   --   --   APTT 45*   < > 41*  --  47*  --   --   --   --  38*  --   --   --   LABPROT 21.6*  --  18.5*  --   --   --   --   --   --  16.6*  --   --   --   INR 2.0*  --  1.6*  --   --   --   --   --   --  1.4*  --   --   --   HEPARINUNFRC  --   --   --   --   --   --   --   --  <0.10*  --   --   --   --   CREATININE 1.45*   < > 1.31*  --  1.27*  --   --   --   --  1.26*  --   --   --    < > = values in this interval not displayed.    Estimated Creatinine Clearance: 68.8 mL/min (A) (by C-G formula based on SCr of 1.26 mg/dL (H)).   Medical History: Past Medical History:  Diagnosis Date  . Asthma   . Diabetes mellitus without complication ([place])   . Diverticulitis   . Gallstones   . IBS (irritable bowel syndrome)   . NAFLD (nonalcoholic fatty liver disease)    CT scan 2015  . Ovarian cyst     Assessment: 54 yo female who is COVID+ on VV ECMO. Patient was started on bivalirudin but was found to have multiple ICH on 8/13 head CT. Bivalirudin was stopped at this time and pt has been off anticoagulation. Her  ECMO circuit was changed today, and pharmacy asked to start low-fixed rate heparin.  Fixed, low-dose heparin was started last night approximately 3-4 hours after circuit change. Heparin level undetectable as expected. Will keep rate the same and not titrate to goal at this time per discussion with ECMO team. LDH up from 917 to 1,141 today. Fibrinogen also up to 176. Hgb stable 8.2, plts remain low at 32. RN reports bleeding overnight from HD catheter that resolved with pressure. ECMO circuit is stable.  Goal of Therapy:  Heparin level 0.2-0.5 units/ml - currently not titrating to goal per discussion with ECMO team Monitor platelets by anticoagulation protocol: Yes   Plan:  Continue heparin 400 units/hr at fixed rate Check daily HL Monitor daily CBC, signs of bleeding, and ECMO circuit  Richardine Service, PharmD PGY2 Cardiology Pharmacy Resident Phone: [phone] 01/31/2020  9:07 AM  Please check [email or web address] for unit-specific pharmacy phone numbers.

## 2020-01-31 NOTE — Progress Notes (Signed)
Met with family this evening with Dr. Haroldine Laws and NP Cheryle Horsfall. Family understands that she has been developing progressive MOF despite aggressive medical and mechanical support, which is ongoing.  They indicate that she would want to continue all aggressive care measures regardless of potentially severe outcomes such as loss of extremities.  All questions were answered.  Hypothermic this afternoon ABG    Component Value Date/Time   PHART 7.393 01/31/2020 1815   PCO2ART 42.5 01/31/2020 1815   PO2ART 56 (L) 01/31/2020 1815   HCO3 26.6 01/31/2020 1815   TCO2 28 01/31/2020 1815   ACIDBASEDEF 1.0 01/31/2020 1703   O2SAT 92.0 01/31/2020 1815   LDH 1141>> 1217 this afternoon  Additional cc time 40 minutes.  Julian Hy, DO 01/31/20 6:52 PM [facility]

## 2020-01-31 NOTE — Progress Notes (Signed)
[facility] for Infectious Disease    Date of Admission:  01/23/2020      ID: Alisha Stephenson is a 54 y.o. female with severe ARDS -on ECMO Principal Problem:   Acute respiratory distress syndrome (ARDS) due to COVID-19 virus ([place]) Active Problems:   NAFLD (nonalcoholic fatty liver disease)   Diabetes mellitus type 1.5, managed as type 1 ([place])   Pneumonia due to COVID-19 virus   Intracerebral hemorrhage   Personal history of ECMO   Palliative care by specialist   Goals of care, counseling/discussion    Subjective: Having increased pressor needs this morning. plt only marginally increased by 2K despite circuit exchanged. Still oozing from lines, elevated LDH 1140    Medications:  . sodium chloride   Intravenous Once  . sodium chloride   Intravenous Once  . vitamin C  500 mg Per Tube Daily  . B-complex with vitamin C  1 tablet Oral Daily  . chlorhexidine gluconate (MEDLINE KIT)  15 mL Mouth Rinse BID  . Chlorhexidine Gluconate Cloth  6 each Topical Daily  . clonazePAM  1 mg Per Tube Q6H  . docusate  100 mg Per Tube BID  . feeding supplement (PROSource TF)  45 mL Per Tube TID  . fentaNYL (SUBLIMAZE) injection  50 mcg Intravenous Once  . linagliptin  5 mg Per Tube Daily  . mouth rinse  15 mL Mouth Rinse 10 times per day  . oxyCODONE  10 mg Per Tube Q6H  . pantoprazole (PROTONIX) IV  40 mg Intravenous Daily  . polyethylene glycol  17 g Per Tube Daily  . sennosides  5 mL Per Tube BID  . vecuronium  10 mg Intravenous Once  . zinc sulfate  220 mg Per Tube Daily    Objective: Vital signs in last 24 hours: Temp:  [94.1 F (34.5 C)-98.1 F (36.7 C)] 98.1 F (36.7 C) (08/25 1200) Pulse Rate:  [66-92] 80 (08/25 1500) Resp:  [14-38] 38 (08/25 1117) BP: (101-161)/(35-60) 134/47 (08/25 1500) SpO2:  [84 %-100 %] 97 % (08/25 1500) Arterial Line BP: (84-197)/(46-69) 135/55 (08/25 1500) FiO2 (%):  [60 %-100 %] 60 % (08/25 1200) Weight:  [134 kg] 134 kg (08/25  0630) Physical Exam  Constitutional:  sedated. appears well-developed and well-nourished. No distress.  HENT: [place]/AT, conjunctival edema Mouth/Throat: OETT Cardiovascular: tachy, regular rhythm and normal heart sounds. Exam reveals no gallop and no friction rub.  No murmur heard.  Pulmonary/Chest: Effort normal and breath sounds rhonchi Neck = right ecmo cannula in place Ext: anasarca+ pitting edema   Lab Results Recent Labs    01/31/20 0300 01/31/20 0448 01/31/20 1028 01/31/20 1541  WBC 11.8*  --   --  17.7*  HGB 8.2*   < > 9.5* 8.8*  HCT 25.9*   < > 28.0* 26.6*  NA 134*   < > 137 135  K 3.9   < > 3.9 4.0  CL 101  --   --  101  CO2 22  --   --  24  BUN 73*  --   --  59*  CREATININE 1.26*  --   --  1.16*   < > = values in this interval not displayed.   Liver Panel Recent Labs    01/30/20 0324 01/31/20 0300  PROT 5.3* 4.9*  ALBUMIN 2.9* 2.4*  AST 123* 206*  ALT 69* 88*  ALKPHOS 139* 155*  BILITOT 1.8* 1.6*  BILIDIR 0.4* 0.7*  IBILI 1.4* 0.9  Sedimentation Rate No results for input(s): ESRSEDRATE in the last 72 hours. C-Reactive Protein No results for input(s): CRP in the last 72 hours.  Microbiology: 8/24 trach aspirate - abundant wbc Studies/Results: DG CHEST PORT 1 VIEW  Result Date: 01/31/2020 CLINICAL DATA:  Respiratory failure EXAM: PORTABLE CHEST 1 VIEW COMPARISON:  January 30, 2020 FINDINGS: Endotracheal tube tip is 4.5 cm above the carina. ECMO catheter present on the right with tip below diaphragm. Feeding tube tip is below the diaphragm. Left jugular catheter tip is in the superior vena cava. No pneumothorax. There is persistent diffuse airspace opacity bilaterally with suspected underlying pleural effusions. Grossly stable cardiac silhouette. IMPRESSION: Tube and catheter positions as described without pneumothorax. Diffuse airspace opacity bilaterally with suspected associated pleural effusions. Appearance indicative of ARDS superimposed with infection  and/or edema. No appreciable change from 1 day prior. Electronically Signed   By: Lowella Grip III M.D.   On: 01/31/2020 08:24   DG CHEST PORT 1 VIEW  Result Date: 01/30/2020 CLINICAL DATA:  ECMO.  ARDS due to COVID 19 virus. EXAM: PORTABLE CHEST 1 VIEW COMPARISON:  One-view chest x-ray 01/30/2019 FINDINGS: Heart is obscured by airspace disease. The tip of the ECMO catheter is at the level of T12, the inferior cavoatrial junction. Left IJ line, endotracheal tube, and enteric tube are stable. The lungs are diffusely opacified bilaterally. Air bronchograms are noted. IMPRESSION: 1. Stable appearance of diffuse bilateral interstitial and airspace disease, compatible ARDS/infection. 2. The tip of the ECMO catheter is at the level of T12, the inferior cavoatrial junction. Electronically Signed   By: San Morelle M.D.   On: 01/30/2020 14:09   DG CHEST PORT 1 VIEW  Result Date: 01/30/2020 CLINICAL DATA:  Respiratory failure EXAM: PORTABLE CHEST 1 VIEW COMPARISON:  January 29, 2020. FINDINGS: ECMO catheter noted on the right with tip below the diaphragm. Left jugular catheter tip is in the superior vena cava, stable. Left subclavian catheter tip in the left subclavian vein near the junction with the left innominate vein, stable. Feeding tube tip is below the diaphragm. No pneumothorax. There is diffuse airspace opacity bilaterally, stable. There may be superimposed pleural effusions. Cardiac border difficult to discern given the diffuse airspace opacities. No gross change in cardiac silhouette. IMPRESSION: Tube and catheter positions as described without pneumothorax. Widespread airspace opacity bilaterally with potential superimposed effusions. Appearance essentially stable compared to 1 day prior. Electronically Signed   By: Lowella Grip III M.D.   On: 01/30/2020 08:16   DG Abd Portable 1V  Result Date: 01/30/2020 CLINICAL DATA:  Feeding tube placement. EXAM: PORTABLE ABDOMEN - 1 VIEW  COMPARISON:  Plain film of the abdomen dated 01/28/2020. FINDINGS: Weighted tip feeding tube appears appropriately positioned in the stomach with tip directed towards the stomach pylorus/duodenal bulb. ECMO cannula in place. IMPRESSION: Weighted tip feeding tube appears appropriately positioned in the stomach with tip directed towards the stomach pylorus/duodenal bulb. Electronically Signed   By: Franki Cabot M.D.   On: 01/30/2020 11:31     Assessment/Plan: 54yo F with severe covid multi organ failure on ecmo/crrt/intubated with worsening pressor needs, labs reveal increased LDH,transaminitis - concerning for liver failure. Thrombocytopenia not significantly improved since ecmo circuit removed.  Enterococcal bacteremia = continue on piptazo. Lines have been changed out thus no residual nidus  mssa pneumonia = covered with piptazo  Leukocytosis = unchanged but may likely increase due to recent hemodynamic instability. Unclear if related to infection rather than to overall response to multiorgan injury/failure  from covid illness  [facility] for Infectious Diseases Cell: [phone] Pager: [phone]  01/31/2020, 5:27 PM

## 2020-01-31 NOTE — Progress Notes (Signed)
  Patient with increasing pressor requirements throughout the day.   Left hand appears more ischemic.   Labs drawn this afternoon with WBC up to 17.7k. Platelets up slightly to 41K. Oozing from IV sites and oral bleeding seems to have slowed.   Remains anuric. Foley now out.   Sats stable on current ECMO settings.  Dr. [name] and I had family meeting with multiple family members and Palliative Care to provide an update. I expressed my concern about worsening multi-system organ failure and diminishing chances for meaningful outcome for Alisha Stephenson.  Continue full support at the time point.   Additional CCT 45 mins.   Alisha Bickers, MD  6:36 PM

## 2020-01-31 NOTE — Progress Notes (Signed)
Assisted tele visit to patient with family member.  Jlee Harkless McEachran, RN  

## 2020-02-01 ENCOUNTER — Inpatient Hospital Stay (HOSPITAL_COMMUNITY): Payer: PRIVATE HEALTH INSURANCE

## 2020-02-01 DIAGNOSIS — J9602 Acute respiratory failure with hypercapnia: Secondary | ICD-10-CM

## 2020-02-01 DIAGNOSIS — R7401 Elevation of levels of liver transaminase levels: Secondary | ICD-10-CM

## 2020-02-01 LAB — POCT I-STAT 7, (LYTES, BLD GAS, ICA,H+H)
Acid-Base Excess: 1 mmol/L (ref 0.0–2.0)
Acid-Base Excess: 2 mmol/L (ref 0.0–2.0)
Acid-Base Excess: 1 mmol/L (ref 0.0–2.0)
Bicarbonate: 26.6 mmol/L (ref 20.0–28.0)
Bicarbonate: 27.7 mmol/L (ref 20.0–28.0)
Bicarbonate: 27.5 mmol/L (ref 20.0–28.0)
Calcium, Ion: 1.06 mmol/L — ABNORMAL LOW (ref 1.15–1.40)
Calcium, Ion: 1.11 mmol/L — ABNORMAL LOW (ref 1.15–1.40)
Calcium, Ion: 1.08 mmol/L — ABNORMAL LOW (ref 1.15–1.40)
HCT: 24 % — ABNORMAL LOW (ref 36.0–46.0)
HCT: 25 % — ABNORMAL LOW (ref 36.0–46.0)
HCT: 26 % — ABNORMAL LOW (ref 36.0–46.0)
Hemoglobin: 8.2 g/dL — ABNORMAL LOW (ref 12.0–15.0)
Hemoglobin: 8.5 g/dL — ABNORMAL LOW (ref 12.0–15.0)
Hemoglobin: 8.8 g/dL — ABNORMAL LOW (ref 12.0–15.0)
O2 Saturation: 89 %
O2 Saturation: 88 %
O2 Saturation: 91 %
Patient temperature: 36.1
Patient temperature: 35.7
Patient temperature: 35.4
Potassium: 4.3 mmol/L (ref 3.5–5.1)
Potassium: 4.4 mmol/L (ref 3.5–5.1)
Potassium: 4.3 mmol/L (ref 3.5–5.1)
Sodium: 137 mmol/L (ref 135–145)
Sodium: 134 mmol/L — ABNORMAL LOW (ref 135–145)
Sodium: 138 mmol/L (ref 135–145)
TCO2: 28 mmol/L (ref 22–32)
TCO2: 29 mmol/L (ref 22–32)
TCO2: 29 mmol/L (ref 22–32)
pCO2 arterial: 45.6 mmHg (ref 32.0–48.0)
pCO2 arterial: 46.8 mmHg (ref 32.0–48.0)
pCO2 arterial: 47.5 mmHg (ref 32.0–48.0)
pH, Arterial: 7.37 (ref 7.350–7.450)
pH, Arterial: 7.375 (ref 7.350–7.450)
pH, Arterial: 7.364 (ref 7.350–7.450)
pO2, Arterial: 57 mmHg — ABNORMAL LOW (ref 83.0–108.0)
pO2, Arterial: 52 mmHg — ABNORMAL LOW (ref 83.0–108.0)
pO2, Arterial: 58 mmHg — ABNORMAL LOW (ref 83.0–108.0)

## 2020-02-01 LAB — GLUCOSE, CAPILLARY
Glucose-Capillary: 108 mg/dL — ABNORMAL HIGH (ref 70–99)
Glucose-Capillary: 120 mg/dL — ABNORMAL HIGH (ref 70–99)
Glucose-Capillary: 146 mg/dL — ABNORMAL HIGH (ref 70–99)
Glucose-Capillary: 157 mg/dL — ABNORMAL HIGH (ref 70–99)
Glucose-Capillary: 161 mg/dL — ABNORMAL HIGH (ref 70–99)
Glucose-Capillary: 168 mg/dL — ABNORMAL HIGH (ref 70–99)
Glucose-Capillary: 173 mg/dL — ABNORMAL HIGH (ref 70–99)
Glucose-Capillary: 174 mg/dL — ABNORMAL HIGH (ref 70–99)
Glucose-Capillary: 175 mg/dL — ABNORMAL HIGH (ref 70–99)
Glucose-Capillary: 176 mg/dL — ABNORMAL HIGH (ref 70–99)
Glucose-Capillary: 178 mg/dL — ABNORMAL HIGH (ref 70–99)
Glucose-Capillary: 184 mg/dL — ABNORMAL HIGH (ref 70–99)
Glucose-Capillary: 187 mg/dL — ABNORMAL HIGH (ref 70–99)
Glucose-Capillary: 188 mg/dL — ABNORMAL HIGH (ref 70–99)
Glucose-Capillary: 197 mg/dL — ABNORMAL HIGH (ref 70–99)
Glucose-Capillary: 200 mg/dL — ABNORMAL HIGH (ref 70–99)
Glucose-Capillary: 201 mg/dL — ABNORMAL HIGH (ref 70–99)
Glucose-Capillary: 202 mg/dL — ABNORMAL HIGH (ref 70–99)
Glucose-Capillary: 210 mg/dL — ABNORMAL HIGH (ref 70–99)
Glucose-Capillary: 218 mg/dL — ABNORMAL HIGH (ref 70–99)

## 2020-02-01 LAB — BASIC METABOLIC PANEL
Anion gap: 13 (ref 5–15)
Anion gap: 9 (ref 5–15)
BUN: 50 mg/dL — ABNORMAL HIGH (ref 6–20)
BUN: 44 mg/dL — ABNORMAL HIGH (ref 6–20)
CO2: 23 mmol/L (ref 22–32)
CO2: 25 mmol/L (ref 22–32)
Calcium: 7.3 mg/dL — ABNORMAL LOW (ref 8.9–10.3)
Calcium: 7.5 mg/dL — ABNORMAL LOW (ref 8.9–10.3)
Chloride: 101 mmol/L (ref 98–111)
Chloride: 102 mmol/L (ref 98–111)
Creatinine, Ser: 1.05 mg/dL — ABNORMAL HIGH (ref 0.44–1.00)
Creatinine, Ser: 1.08 mg/dL — ABNORMAL HIGH (ref 0.44–1.00)
GFR calc Af Amer: >60 mL/min (ref >60)
GFR calc Af Amer: >60 mL/min (ref >60)
GFR calc non Af Amer: 60 mL/min — ABNORMAL LOW (ref >60)
GFR calc non Af Amer: 58 mL/min — ABNORMAL LOW (ref >60)
Glucose, Bld: 207 mg/dL — ABNORMAL HIGH (ref 70–99)
Glucose, Bld: 194 mg/dL — ABNORMAL HIGH (ref 70–99)
Potassium: 4.3 mmol/L (ref 3.5–5.1)
Potassium: 4.4 mmol/L (ref 3.5–5.1)
Sodium: 137 mmol/L (ref 135–145)
Sodium: 136 mmol/L (ref 135–145)

## 2020-02-01 LAB — TYPE AND SCREEN
ABO/RH(D): O POS
Antibody Screen: NEGATIVE
Unit division: 0
Unit division: 0
Unit division: 0
Unit division: 0

## 2020-02-01 LAB — BPAM RBC
Blood Product Expiration Date: 202109122359
Blood Product Expiration Date: 202109122359
Blood Product Expiration Date: 202109172359
Blood Product Expiration Date: 202109212359
ISSUE DATE / TIME: 202108241953
Unit Type and Rh: 5100
Unit Type and Rh: 5100
Unit Type and Rh: 5100
Unit Type and Rh: 5100

## 2020-02-01 LAB — CBC
HCT: 25.4 % — ABNORMAL LOW (ref 36.0–46.0)
HCT: 25.5 % — ABNORMAL LOW (ref 36.0–46.0)
Hemoglobin: 8.1 g/dL — ABNORMAL LOW (ref 12.0–15.0)
Hemoglobin: 8.1 g/dL — ABNORMAL LOW (ref 12.0–15.0)
MCH: 31.6 pg (ref 26.0–34.0)
MCH: 32 pg (ref 26.0–34.0)
MCHC: 31.9 g/dL (ref 30.0–36.0)
MCHC: 31.8 g/dL (ref 30.0–36.0)
MCV: 99.2 fL (ref 80.0–100.0)
MCV: 100.8 fL — ABNORMAL HIGH (ref 80.0–100.0)
Platelets: 46 10*3/uL — ABNORMAL LOW (ref 150–400)
Platelets: 53 10*3/uL — ABNORMAL LOW (ref 150–400)
RBC: 2.56 MIL/uL — ABNORMAL LOW (ref 3.87–5.11)
RBC: 2.53 MIL/uL — ABNORMAL LOW (ref 3.87–5.11)
RDW: 22.2 % — ABNORMAL HIGH (ref 11.5–15.5)
RDW: 23.1 % — ABNORMAL HIGH (ref 11.5–15.5)
WBC: 19 10*3/uL — ABNORMAL HIGH (ref 4.0–10.5)
WBC: 20.9 10*3/uL — ABNORMAL HIGH (ref 4.0–10.5)
nRBC: 18.9 % — ABNORMAL HIGH (ref 0.0–0.2)
nRBC: 15.3 % — ABNORMAL HIGH (ref 0.0–0.2)

## 2020-02-01 LAB — HEPATIC FUNCTION PANEL
ALT: 118 U/L — ABNORMAL HIGH (ref 0–44)
AST: 245 U/L — ABNORMAL HIGH (ref 15–41)
Albumin: 2.3 g/dL — ABNORMAL LOW (ref 3.5–5.0)
Alkaline Phosphatase: 204 U/L — ABNORMAL HIGH (ref 38–126)
Bilirubin, Direct: 0.8 mg/dL — ABNORMAL HIGH (ref 0.0–0.2)
Indirect Bilirubin: 1.1 mg/dL — ABNORMAL HIGH (ref 0.3–0.9)
Total Bilirubin: 1.9 mg/dL — ABNORMAL HIGH (ref 0.3–1.2)
Total Protein: 4.9 g/dL — ABNORMAL LOW (ref 6.5–8.1)

## 2020-02-01 LAB — HAPTOGLOBIN: Haptoglobin: <10 mg/dL — ABNORMAL LOW (ref 33–346)

## 2020-02-01 LAB — PROTIME-INR
INR: 1.3 — ABNORMAL HIGH (ref 0.8–1.2)
Prothrombin Time: 15.3 seconds — ABNORMAL HIGH (ref 11.4–15.2)

## 2020-02-01 LAB — LACTIC ACID, PLASMA
Lactic Acid, Venous: 1.3 mmol/L (ref 0.5–1.9)
Lactic Acid, Venous: 1.4 mmol/L (ref 0.5–1.9)

## 2020-02-01 LAB — FIBRINOGEN: Fibrinogen: 316 mg/dL (ref 210–475)

## 2020-02-01 LAB — LACTATE DEHYDROGENASE: LDH: 1250 U/L — ABNORMAL HIGH (ref 98–192)

## 2020-02-01 LAB — HEPARIN LEVEL (UNFRACTIONATED): Heparin Unfractionated: <0.1 IU/mL — ABNORMAL LOW (ref 0.30–0.70)

## 2020-02-01 LAB — PHOSPHORUS: Phosphorus: 4.4 mg/dL (ref 2.5–4.6)

## 2020-02-01 LAB — MAGNESIUM: Magnesium: 2.7 mg/dL — ABNORMAL HIGH (ref 1.7–2.4)

## 2020-02-01 LAB — APTT
aPTT: 38 seconds — ABNORMAL HIGH (ref 24–36)
aPTT: 44 seconds — ABNORMAL HIGH (ref 24–36)

## 2020-02-01 MED ORDER — AMIODARONE LOAD VIA INFUSION
150.0000 mg | Freq: Once | INTRAVENOUS | Status: AC
  Administered 2020-02-01: 150 mg via INTRAVENOUS
  Filled 2020-02-01: qty 83.34

## 2020-02-01 MED ORDER — POLYETHYLENE GLYCOL 3350 17 G PO PACK: 17.0000 g | PACK | Freq: Every day | ORAL | Status: DC

## 2020-02-01 MED ORDER — SODIUM CHLORIDE 0.9 % IV SOLN
0.5000 mg/h | INTRAVENOUS | Status: DC
  Administered 2020-02-02: 3 mg/h via INTRAVENOUS
  Administered 2020-02-03 – 2020-02-04 (×2): 2 mg/h via INTRAVENOUS
  Administered 2020-02-07: 1.5 mg/h via INTRAVENOUS
  Filled 2020-02-01 (×9): qty 5

## 2020-02-01 MED ORDER — SODIUM CHLORIDE 0.9 % IV SOLN
200.0000 mg | Freq: Once | INTRAVENOUS | Status: AC
  Administered 2020-02-01: 200 mg via INTRAVENOUS
  Filled 2020-02-01: qty 200

## 2020-02-01 MED ORDER — SODIUM CHLORIDE 0.9 % IV SOLN
0.0000 mg/h | INTRAVENOUS | Status: DC
  Administered 2020-02-01: 1 mg/h via INTRAVENOUS
  Filled 2020-02-01: qty 5

## 2020-02-01 MED ORDER — DOCUSATE SODIUM 50 MG/5ML PO LIQD: 100.0000 mg | Freq: Two times a day (BID) | ORAL | Status: DC

## 2020-02-01 MED ORDER — HYDROMORPHONE BOLUS VIA INFUSION
0.5000 mg | INTRAVENOUS | Status: DC | PRN
  Administered 2020-02-01 – 2020-02-08 (×15): 0.5 mg via INTRAVENOUS
  Filled 2020-02-01: qty 1

## 2020-02-01 MED ORDER — HYDROMORPHONE HCL 1 MG/ML IJ SOLN
1.0000 mg | Freq: Once | INTRAMUSCULAR | Status: AC
  Administered 2020-02-01: 1 mg via INTRAVENOUS
  Filled 2020-02-01: qty 1

## 2020-02-01 MED ORDER — SODIUM CHLORIDE 0.9 % IV SOLN
100.0000 mg | INTRAVENOUS | Status: AC
  Administered 2020-02-02 – 2020-02-08 (×7): 100 mg via INTRAVENOUS
  Filled 2020-02-01 (×7): qty 100

## 2020-02-01 NOTE — Progress Notes (Signed)
PT Cancellation Note  Patient Details Name: Alisha Stephenson MRN: [id number] DOB: [birth year]   Cancelled Treatment:    Reason Eval/Treat Not Completed: Patient not medically ready (sedated and not appropriate for participation at this time)   Olita Takeshita B Thekla Colborn 02/01/2020, 8:40 AM  Bayard Males, PT Acute Rehabilitation Services Pager: [phone] Office: [phone]

## 2020-02-01 NOTE — Progress Notes (Signed)
Advanced Heart Failure Rounding Note   Subjective:    - 8/2 COVID + test - 8/9 Cannulated for VV ECMO - 8/13 with several areas of intracranial hemorrhage. Bival stopped.  - 8/14 CT no change in [place]. Increased edema - 8/16 Extubated - 8/16 Head CT stable bleed - 8/22 CVVHD started - 8/24 reintubated - 8/24 circuit changed  Remains awake on vent. Intermittently will follow commands. On NE 18 VP 0.04  Heparin in circuit at 400u/hr  Remains on CVVHD. Able to keep negative yesterday. Weight down 1 pound  ECMO   Flow 5.04L RPM 4200 Sweep8 -> 11  Labs:  7.37/46/57/89% Hgb8.2 PLT 32k -> 46k LDH 429 -> 451 -> 576 -> 527 -> 547 -> 655 -> 917 -> 1,141 -> 1250 Heparin level  < 0.10 Lactic acid1.3 Fibrinogen 176 -> 316  Objective:   Weight Range:  Vital Signs:   Temp:  [94.1 F (34.5 C)-98.1 F (36.7 C)] 94.5 F (34.7 C) (08/26 0600) Pulse Rate:  [76-106] 94 (08/26 0600) Resp:  [18-38] 26 (08/26 0402) BP: (108-161)/(35-93) 112/39 (08/26 0600) SpO2:  [82 %-99 %] 85 % (08/26 0600) Arterial Line BP: (98-197)/(45-68) 125/51 (08/26 0600) FiO2 (%):  [60 %-70 %] 60 % (08/26 0402) Weight:  [133.5 kg] 133.5 kg (08/26 0500) Last BM Date: 01/31/20  Weight change: Filed Weights   01/30/20 0500 01/31/20 0630 02/01/20 0500  Weight: (!) 155.2 kg 134 kg 133.5 kg    Intake/Output:   Intake/Output Summary (Last 24 hours) at 02/01/2020 0727 Last data filed at 02/01/2020 0700 Gross per 24 hour  Intake 5622.01 ml  Output 6962 ml  Net -1339.99 ml     Physical Exam: General:  Ill appearing. Intubated. Awake but won't follow commands HEENT: normal + trach + scleral edema mild icterus Neck: supple. RIJ ECMO cannula  LSC HD cath Cor: PMI nondisplaced. Regular rate & rhythm. No rubs, gallops or murmurs. Lungs: minimal air movement Abdomen: obese soft, nontender, nondistended.  Good bowel sounds. Extremities: no cyanosis, clubbing, rash, 1-2+ edema both hands ischemic  appearing. Mild cyanosis of feet as well Neuro: awake. Not following commands for me this am    Telemetry: Sinus 90s Personally reviewed   Labs: Basic Metabolic Panel: Recent Labs  Lab 01/27/20 2221 01/28/20 0308 01/29/20 0451 01/29/20 9628 01/30/20 0324 01/30/20 3662 01/30/20 1817 01/30/20 1818 01/31/20 0300 01/31/20 0448 01/31/20 1541 01/31/20 1541 01/31/20 1703 01/31/20 1815 01/31/20 1946 02/01/20 0226 02/01/20 0635  NA  --    < > 147*   < > 141   < > 137   < > 134*   < > 135   < > 136 137 137 137 137  K  --    < > 4.8   < > 4.7   < > 4.5   < > 3.9   < > 4.0   < > 3.8 4.1 3.9 4.3 4.3  CL  --    < > 109   < > 103  --  102  --  101  --  101  --   --   --   --  101  --   CO2  --    < > 25   < > 25  --  23  --  22  --  24  --   --   --   --  23  --   GLUCOSE  --    < > 211*   < >  164*  --  146*  --  278*  --  218*  --   --   --   --  207*  --   BUN  --    < > 151*   < > 95*  --  77*  --  73*  --  59*  --   --   --   --  50*  --   CREATININE  --    < > 1.45*   < > 1.31*  --  1.27*  --  1.26*  --  1.16*  --   --   --   --  1.05*  --   CALCIUM  --    < > 8.6*   < > 8.0*   < > 7.5*   < > 7.1*  --  7.3*  --   --   --   --  7.3*  --   MG 3.2*  --  3.3*  --  2.9*  --   --   --  2.6*  --   --   --   --   --   --  2.7*  --   PHOS  --   --  5.0*  --  6.0*  --   --   --  6.1*  --   --   --   --   --   --  4.4  --    < > = values in this interval not displayed.    Liver Function Tests: Recent Labs  Lab 01/28/20 0308 01/29/20 0451 01/30/20 0324 01/31/20 0300 02/01/20 0226  AST 35 65* 123* 206* 245*  ALT 61* 59* 69* 88* 118*  ALKPHOS 140* 137* 139* 155* 204*  BILITOT 1.4* 1.5* 1.8* 1.6* 1.9*  PROT 5.3* 5.4* 5.3* 4.9* 4.9*  ALBUMIN 2.8* 3.2* 2.9* 2.4* 2.3*   No results for input(s): LIPASE, AMYLASE in the last 168 hours. No results for input(s): AMMONIA in the last 168 hours.  CBC: Recent Labs  Lab 01/30/20 1817 01/30/20 1818 01/31/20 0032 01/31/20 0032 01/31/20 0300  01/31/20 0448 01/31/20 1541 01/31/20 1541 01/31/20 1703 01/31/20 1815 01/31/20 1946 02/01/20 0226 02/01/20 0635  WBC 12.6*  --  11.0*  --  11.8*  --  17.7*  --   --   --   --  19.0*  --   HGB 7.5*   < > 8.5*   < > 8.2*   < > 8.8*   < > 8.8* 8.8* 8.2* 8.1* 8.2*  HCT 23.7*   < > 25.7*   < > 25.9*   < > 26.6*   < > 26.0* 26.0* 24.0* 25.4* 24.0*  MCV 98.8  --  98.5  --  98.1  --  99.3  --   --   --   --  99.2  --   PLT 30*  --  34*  --  32*  --  41*  --   --   --   --  46*  --    < > = values in this interval not displayed.    Cardiac Enzymes: No results for input(s): CKTOTAL, CKMB, CKMBINDEX, TROPONINI in the last 168 hours.  BNP: BNP (last 3 results) No results for input(s): BNP in the last 8760 hours.  ProBNP (last 3 results) No results for input(s): PROBNP in the last 8760 hours.    Other results:  Imaging: DG CHEST PORT 1 VIEW  Result Date: 01/31/2020  CLINICAL DATA:  Respiratory failure EXAM: PORTABLE CHEST 1 VIEW COMPARISON:  January 30, 2020 FINDINGS: Endotracheal tube tip is 4.5 cm above the carina. ECMO catheter present on the right with tip below diaphragm. Feeding tube tip is below the diaphragm. Left jugular catheter tip is in the superior vena cava. No pneumothorax. There is persistent diffuse airspace opacity bilaterally with suspected underlying pleural effusions. Grossly stable cardiac silhouette. IMPRESSION: Tube and catheter positions as described without pneumothorax. Diffuse airspace opacity bilaterally with suspected associated pleural effusions. Appearance indicative of ARDS superimposed with infection and/or edema. No appreciable change from 1 day prior. Electronically Signed   By: Lowella Grip III M.D.   On: 01/31/2020 08:24   DG CHEST PORT 1 VIEW  Result Date: 01/30/2020 CLINICAL DATA:  ECMO.  ARDS due to COVID 19 virus. EXAM: PORTABLE CHEST 1 VIEW COMPARISON:  One-view chest x-ray 01/30/2019 FINDINGS: Heart is obscured by airspace disease. The tip of  the ECMO catheter is at the level of T12, the inferior cavoatrial junction. Left IJ line, endotracheal tube, and enteric tube are stable. The lungs are diffusely opacified bilaterally. Air bronchograms are noted. IMPRESSION: 1. Stable appearance of diffuse bilateral interstitial and airspace disease, compatible ARDS/infection. 2. The tip of the ECMO catheter is at the level of T12, the inferior cavoatrial junction. Electronically Signed   By: San Morelle M.D.   On: 01/30/2020 14:09   DG Abd Portable 1V  Result Date: 01/30/2020 CLINICAL DATA:  Feeding tube placement. EXAM: PORTABLE ABDOMEN - 1 VIEW COMPARISON:  Plain film of the abdomen dated 02/03/2020. FINDINGS: Weighted tip feeding tube appears appropriately positioned in the stomach with tip directed towards the stomach pylorus/duodenal bulb. ECMO cannula in place. IMPRESSION: Weighted tip feeding tube appears appropriately positioned in the stomach with tip directed towards the stomach pylorus/duodenal bulb. Electronically Signed   By: Franki Cabot M.D.   On: 01/30/2020 11:31     Medications:     Scheduled Medications:  sodium chloride   Intravenous Once   sodium chloride   Intravenous Once   vitamin C  500 mg Per Tube Daily   B-complex with vitamin C  1 tablet Oral Daily   chlorhexidine gluconate (MEDLINE KIT)  15 mL Mouth Rinse BID   Chlorhexidine Gluconate Cloth  6 each Topical Daily   clonazePAM  1 mg Per Tube Q6H   docusate  100 mg Per Tube BID   feeding supplement (PROSource TF)  45 mL Per Tube TID   fentaNYL (SUBLIMAZE) injection  50 mcg Intravenous Once   linagliptin  5 mg Per Tube Daily   mouth rinse  15 mL Mouth Rinse 10 times per day   oxyCODONE  10 mg Per Tube Q6H   pantoprazole (PROTONIX) IV  40 mg Intravenous Daily   polyethylene glycol  17 g Per Tube Daily   sennosides  5 mL Per Tube BID   vecuronium  10 mg Intravenous Once   zinc sulfate  220 mg Per Tube Daily    Infusions:  sodium  chloride 250 mL (01/29/20 1444)   albumin human 12.5 g (01/28/20 1229)   albumin human     amiodarone 30 mg/hr (02/01/20 0700)   dexmedetomidine (PRECEDEX) IV infusion 1 mcg/kg/hr (02/01/20 0700)   feeding supplement (PIVOT 1.5 CAL) 75 mL/hr at 02/01/20 0600   fentaNYL infusion INTRAVENOUS 350 mcg/hr (02/01/20 0700)   heparin 400 Units/hr (02/01/20 0700)   insulin 19 mL/hr at 02/01/20 0700   milrinone 0.125 mcg/kg/min (02/01/20 0700)  norepinephrine (LEVOPHED) Adult infusion 18 mcg/min (02/01/20 0700)   piperacillin-tazobactam (ZOSYN)  IV Stopped (02/01/20 0612)   prismasol BGK 2/2.5 replacement solution 500 mL/hr at 02/01/20 0604   prismasol BGK 2/2.5 replacement solution 300 mL/hr at 01/31/20 1613   prismasol BGK 4/2.5 2,000 mL/hr at 02/01/20 0451   vasopressin 0.04 Units/min (02/01/20 0700)    PRN Medications: sodium chloride, acetaminophen (TYLENOL) oral liquid 160 mg/5 mL, albumin human, albuterol, dextrose, fentaNYL, fentaNYL (SUBLIMAZE) injection, guaiFENesin-dextromethorphan, heparin, hydrALAZINE, hydrOXYzine, ondansetron **OR** ondansetron (ZOFRAN) IV, sodium chloride   Assessment/Plan:   1. Acute hypoxic/hypercapneic respiratory failure in setting of severe COVID PNA/ARDS -> VV ECMO - admit 8/2 - intubation 8/3 - has received actmera (compelted 8/2), remdesivir (completed 8/6) and steroids - failed full vent support with proning/paralytic -> Cannulated for VV ECMO on 8/9 - Extubated 8/16. Reintubated 8/24 - CXR unchanged -> diffuse infiltrates. Personally reviewed - Circuit changed 8/24 due to concern for hemolysis and worsening oxygenation. Oxygenation improved. LDH remains high but PLTs and fibrinogen trending up slowly. - CVVHD started 8/22 for volume removal and uremia.  - Now with evidence of worsening multi-system organ failure (brain, lungs, kidneys, liver) I am increasingly concerned about her prognosis and our ability to get her a meaning outcome -  Will d/w further with family this am. We have also reached back out to Palliative Care for their input  2. Enterococcus sepsis - back on high-dose zosyn per ID - Continue VP and NE for BP support - WBC climbing. Bcx redrawn. - Will d/w ID re adding back  3. Intracranial hemorrhage - ? Septic emboli - repeat head CT on 8/14 with stable bleeds but increased edema - neurology following. Suspect significant long-term injury sustained. Will follow commands. Appears to have dense LUE weakness and possibly LLE - repeat head CT stable 8/16 - Now back on low-dose heparin to protect circuit from further clotting. Heparin level undetectable. Exam stable  - Consider TEE at some point to further evalaute. Can consider now that she is re-intubated but won't change management currently  4. Thrombocytopenia - PLTs up to 46K today after circuit change - Continue to follow  5. Morbid obesity - Body mass index is Body mass index is 50.52 kg/m.  6. Poorly controlled DM2 - HgBA1c 10.7 - CBGs remain elevated - On IV insulin adjust as needed  6. Hypernatremia - Na 137.Improving with HD.   7. Lactic acidosis -  Improving lactic 1.3 today  8. PAF - 2 episodes of AF with RVR on 8/21 - back in NSR on IV amio this am. Continue   9. AKI/azotemia - CVVHD started on 8/22 - We were able to keep negative overnight - Discussed with Renal this am. Will continue. BUN down to 50  10. Ischemic R fingers - ? Due to septic embolic versus vascular issue - check /us. Consult VVS  11. Acute liver failure - worsening LFTs and bili - has underlying fatty liver - mix of conjugated/unconjugated   CRITICAL CARE Performed by: Glori Bickers  Total critical care time: 45 minutes  Critical care time was exclusive of separately billable procedures and treating other patients.  Critical care was necessary to treat or prevent imminent or life-threatening deterioration.  Critical care was time spent  personally by me (independent of midlevel providers or residents) on the following activities: development of treatment plan with patient and/or surrogate as well as nursing, discussions with consultants, evaluation of patient's response to treatment, examination of patient, obtaining history  from patient or surrogate, ordering and performing treatments and interventions, ordering and review of laboratory studies, ordering and review of radiographic studies, pulse oximetry and re-evaluation of patient's condition.   Length of Stay: 24   Glori Bickers  MD 02/01/2020, 7:27 AM  Advanced Heart Failure Team Pager [phone] (M-F; 7a - 4p)  Please contact [facility] for night-coverage after hours (4p -7a ) and weekends on [email or web address]

## 2020-02-01 NOTE — Plan of Care (Signed)
Problem: Education: Goal: Knowledge of General Education information will improve Description: Including pain rating scale, medication(s)/side effects and non-pharmacologic comfort measures Outcome: Progressing   Problem: Health Behavior/Discharge Planning: Goal: Ability to manage health-related needs will improve Outcome: Progressing   Problem: Clinical Measurements: Goal: Ability to maintain clinical measurements within normal limits will improve Outcome: Progressing Goal: Will remain free from infection Outcome: Progressing Goal: Diagnostic test results will improve Outcome: Progressing Goal: Respiratory complications will improve Outcome: Progressing Goal: Cardiovascular complication will be avoided Outcome: Progressing   Problem: Activity: Goal: Risk for activity intolerance will decrease Outcome: Progressing   Problem: Nutrition: Goal: Adequate nutrition will be maintained Outcome: Progressing   Problem: Coping: Goal: Level of anxiety will decrease Outcome: Progressing   Problem: Elimination: Goal: Will not experience complications related to bowel motility Outcome: Progressing Goal: Will not experience complications related to urinary retention Outcome: Progressing   Problem: Pain Managment: Goal: General experience of comfort will improve Outcome: Progressing   Problem: Safety: Goal: Ability to remain free from injury will improve Outcome: Progressing   Problem: Skin Integrity: Goal: Risk for impaired skin integrity will decrease Outcome: Progressing   Problem: Education: Goal: Knowledge of risk factors and measures for prevention of condition will improve Outcome: Progressing   Problem: Coping: Goal: Psychosocial and spiritual needs will be supported Outcome: Progressing   Problem: Respiratory: Goal: Will maintain a patent airway Outcome: Progressing Goal: Complications related to the disease process, condition or treatment will be avoided or  minimized Outcome: Progressing   Problem: Education: Goal: Knowledge of risk factors and measures for prevention of condition will improve Outcome: Progressing   Problem: Coping: Goal: Psychosocial and spiritual needs will be supported Outcome: Progressing   Problem: Respiratory: Goal: Will maintain a patent airway Outcome: Progressing Goal: Complications related to the disease process, condition or treatment will be avoided or minimized Outcome: Progressing   Problem: Education: Goal: Ability to describe self-care measures that may prevent or decrease complications (Diabetes Survival Skills Education) will improve Outcome: Progressing Goal: Individualized Educational Video(s) Outcome: Progressing   Problem: Coping: Goal: Ability to adjust to condition or change in health will improve Outcome: Progressing   Problem: Fluid Volume: Goal: Ability to maintain a balanced intake and output will improve Outcome: Progressing   Problem: Health Behavior/Discharge Planning: Goal: Ability to identify and utilize available resources and services will improve Outcome: Progressing Goal: Ability to manage health-related needs will improve Outcome: Progressing   Problem: Metabolic: Goal: Ability to maintain appropriate glucose levels will improve Outcome: Progressing   Problem: Nutritional: Goal: Maintenance of adequate nutrition will improve Outcome: Progressing Goal: Progress toward achieving an optimal weight will improve Outcome: Progressing   Problem: Skin Integrity: Goal: Risk for impaired skin integrity will decrease Outcome: Progressing   Problem: Tissue Perfusion: Goal: Adequacy of tissue perfusion will improve Outcome: Progressing   Problem: Education: Goal: Knowledge of risk factors and measures for prevention of condition will improve Outcome: Progressing   Problem: Coping: Goal: Psychosocial and spiritual needs will be supported Outcome: Progressing    Problem: Respiratory: Goal: Will maintain a patent airway Outcome: Progressing Goal: Complications related to the disease process, condition or treatment will be avoided or minimized Outcome: Progressing   Problem: Education: Goal: Knowledge of disease or condition will improve Outcome: Progressing Goal: Knowledge of secondary prevention will improve Outcome: Progressing Goal: Knowledge of patient specific risk factors addressed and post discharge goals established will improve Outcome: Progressing Goal: Individualized Educational Video(s) Outcome: Progressing   Problem: Coping: Goal: Will verbalize  positive feelings about self Outcome: Progressing Goal: Will identify appropriate support needs Outcome: Progressing   Problem: Health Behavior/Discharge Planning: Goal: Ability to manage health-related needs will improve Outcome: Progressing   Problem: Self-Care: Goal: Ability to participate in self-care as condition permits will improve Outcome: Progressing Goal: Verbalization of feelings and concerns over difficulty with self-care will improve Outcome: Progressing Goal: Ability to communicate needs accurately will improve Outcome: Progressing   Problem: Nutrition: Goal: Risk of aspiration will decrease Outcome: Progressing Goal: Dietary intake will improve Outcome: Progressing   Problem: Intracerebral Hemorrhage Tissue Perfusion: Goal: Complications of Intracerebral Hemorrhage will be minimized Outcome: Progressing   Problem: Ischemic Stroke/TIA Tissue Perfusion: Goal: Complications of ischemic stroke/TIA will be minimized Outcome: Progressing   Problem: Spontaneous Subarachnoid Hemorrhage Tissue Perfusion: Goal: Complications of Spontaneous Subarachnoid Hemorrhage will be minimized Outcome: Progressing

## 2020-02-01 NOTE — Progress Notes (Signed)
175cc of Fentanyl wasted in sink withh [employer] manager.

## 2020-02-01 NOTE — Plan of Care (Signed)

## 2020-02-01 NOTE — Progress Notes (Signed)
Pharmacy Antibiotic Note  Alisha Stephenson is a 54 y.o. female admitted on 02/06/2020 with COVID pneumonia now s/p VV ECMO cannulation on 01/29/2020.    Found to have staph aureus (MSSA) + enterococcus faecalis (pan-sen). Patient has been on high-dose Zosyn given pharmacokinetic properties with ECMO. Now with concern of endocarditis. BAL from 8/24 growing 10K colonies of yeast. Pharmacy asked to start Eraxis.   WBC still elevated 19, she remains on CRRT and hypothermic on ECMO.  Plan: Continue Zosyn 4.5g IV q6 hrs Start Eraxis 257m IV once, then 1058mIV q24 hrs Monitor clinical progression, cultures, CRRT duration    Height: _0  (162.6 cm) Weight: 133.5 kg (294 lb 5 oz) IBW/kg (Calculated) : 54.7  Temp (24hrs), Avg:96.7 F (35.9 C), Min:94.1 F (34.5 C), Max:98.1 F (36.7 C)  Recent Labs  Lab 01/30/20 0324 01/30/20 0324 01/30/20 1817 01/31/20 0032 01/31/20 0258 01/31/20 0300 01/31/20 1541 02/01/20 0226  WBC 11.3*   < > 12.6* 11.0*  --  11.8* 17.7* 19.0*  CREATININE 1.31*  --  1.27*  --   --  1.26* 1.16* 1.05*  LATICACIDVEN 1.7  --  1.6  --  1.7  --  1.4 1.3   < > = values in this interval not displayed.    Estimated Creatinine Clearance: 82.4 mL/min (A) (by C-G formula based on SCr of 1.05 mg/dL (H)).    Allergies  Allergen Reactions  . Sulfa Antibiotics Rash and Other (See Comments)    Antimicrobials this admission: Remdesivir 8/2 >> 8/6 Azithromycin 8/3 >> 8/6 CTX 8/3 >> 8/6; 8/22>>8/23 Vancomycin 8/12 >> 8/15, 8/19>>8/22 Meropenem 8/12 >> 8/13 Zosyn 8/13 >> 8/22; 8/23 >> Fluconazole 8/19 >>8/22 Ampicillin 8/22>>8/23 Eraxis 8/26>>  Dose adjustments this admission: VT 25 - adjust regimen to vanc 1g IV every 12 hours  Microbiology results: 8/24 BAL: 10K yeast 8/19 BCWKM:QKMM/15 BCx: neg 8/12 BCx: enterococcus (pan-sens) 8/12 TA: MSSA 8/4 TA: neg 8/4 UCx: neg 8/2 BCx: neg 8/4 MRSA PCR neg 8/2 COVID pos  ChRichardine ServicePharmD PGY2 Cardiology Pharmacy  Resident Phone: [phone]/26/2021  4:17 PM  Please check [email or web address] for unit-specific pharmacy phone numbers.

## 2020-02-01 NOTE — Progress Notes (Signed)
NAME:  Alisha Stephenson, MRN:  [id number], DOB:  01-15-1966, LOS: 24 ADMISSION DATE:  01/30/2020, CONSULTATION DATE:  8/3 REFERRING MD:  Alfredia Ferguson, CHIEF COMPLAINT:  Dyspnea   Brief History   54 y/o female admitted on 8/2 with severe acute respiratory failure with hypoxemia due to COVID 19 pneumonia.  She developed symptoms 1 week prior to admission.  Past Medical History  DM2 Diverticulitis Gallstones Ovarian cyst NAFLD Asthma  Significant Hospital Events   8/2 admit 8/3 ICU transfer, intubated 8/4 prone, paralyze 8/9 significant desaturations today 8/9 VV ECMO cannulation 8/13 ICH  Consults:  PCCM ECMO team    Procedures:  8/3 ETT >  8/3 PICC >  8/9 LIJ MML 8/9 RIJ [place] 41F   Significant Diagnostic Tests:  7/31 CT head > NAICP 7/31 MRI/MRA brain > no acute changes, possibly small aneurysm ACOM 8/13 CT head> multiple areas of ICH  Micro Data:  8/2 blood > NG 8/2 SARS COV 2 > positive 8/4 resp > negative 8/4 urine >  8/12 blood > E. faecalis (pan-sensitive) 8/12 resp: staph aureus> MSSA 8/25 blood>> 8/24 BAL>   Antimicrobials:  8/2 remdesivir > 8/6 8/2 actemra  8/2 solumedrol >   8/3 ceftriaxone >  8/5 8/3 azithro >  8/5  8/12 meropenem>8/16 8/12 vanc>8/16  8/13 zosyn >> 8/20 , restart 8/23 Vanc 8/19>> 8/22 Fluconazole 8/19>> 8/22 Amp & ceftriaxone  8/22-8/23   Interim history/subjective:  No significant bleeding. Worse cyanosis left hand, now some toes dusky. Episode of A-fib with RVR this morning.  Objective   Blood pressure (!) 112/39, pulse 94, temperature (!) 94.5 F (34.7 C), resp. rate (!) 26, height _0  (1.626 m), weight 133.5 kg, SpO2 (!) 85 %. CVP:  [12 mmHg-24 mmHg] 22 mmHg  Vent Mode: PCV FiO2 (%):  [60 %-70 %] 60 % Set Rate:  [15 bmp] 15 bmp PEEP:  [12 cmH20] 12 cmH20 Plateau Pressure:  [18 cmH20-24 cmH20] 24 cmH20   Intake/Output Summary (Last 24 hours) at 02/01/2020 0721 Last data filed at 02/01/2020 0700 Gross per 24  hour  Intake 5622.01 ml  Output 6962 ml  Net -1339.99 ml   Filed Weights   01/30/20 0500 01/31/20 0630 02/01/20 0500  Weight: (!) 155.2 kg 134 kg 133.5 kg    Examination:  GEN: Critically ill-appearing woman lying in bed intubated, lightly sedated, on ECMO HEENT: [place]/AT, scleral edema, mild scleral icterus CV: Regular rate and rhythm PULM: Breathing comfortably on the vent, bloody secretions from ETT. GI: Obese, soft, nontender, nondistended EXT: Significant edema in her hands.  Worse cyanosis right thumb, index finger, medial middle finger.  Bruising over her knuckles on her left hand, but not distal digit ischemia.  Cyanosis beneath toenail. NEURO: RASS -3.  Tracking sometimes, not following commands. SKIN: Pallor, no rashes   Labs reviewed-fibrinogen and platelets slightly improved, increasing LFTs CXR 8/25 personally reviewed: No lung aeration. ETT in appropriate position.    Resolved Hospital Problem list     Assessment & Plan:  Acute hypoxic and AoC hypercapneic respiratory failure; likely underly OHS (baseline bicarb 27-30) ARDS due to COVID 19 pneumonia   VV ECMO cannulation on 8/9 for refractory hypoxemia MSSA pneumonia -Continue VV ECMO. Circuit change on 8/24, cannula pulled back. Family understands concern for progressive MOF despite aggressive medical care. -Con't MV. Not a candidate for extubation again unless she can cough and clear secretions adequately. Likely will require trach in the coming days. -low dose heparin to prevent clotting in  new circuit  Enterococcal bacteremia-recent blood cultures cleared Concern for possible embolic phenomenon- fingers and possibly CVAs were embolic  -Appreciate infectious disease team's assistance.  Back on zosyn due to concern for bioavailability of drug on ECMO. Will discuss possibly adding vancomycin given worsening leukocytosis.  -Appreciate vascular surgery's input. Will have to allow digits to demarcate. High risk for  losing digits. -Recultured yesterday. Foley removed due to lack of UOP.  Elevated LDH, hypo-fibrinogenemia and thrombocytopenia; most likely due to dedication side effect versus sepsis versus chronic hepatic disease less likely. Hb stable and low platelets but no schistocytes on smear suggest DIC is not cause.   -Continue to monitor platelet count -Holding quetiapine, but antibiotics still required despite risk that they could be contributing to thrombocytopenia.  ICH, multifocal. L-sided weakness.  Encephalopathy- ICU delirium + ischemic injury + azotemia - Con't CRRT - limit sedation as much as possible - reorientation, limit deliriogenic meds as much as possible. - PT & OT when able  Acute renal failure, severe uremia -Continue CRRT; goal net negative. Have been limited by hypotension. -Strict I's/O  -Renally dose medications and avoid nephrotoxic meds.  Shock related to sedation vs sepsis and RV stunning- milrinone and PRN norepinephrine Septic shock- Enterococcus bacteremia Increased WBC, hypotension and question sepsis 8/19, question of septic emboli to explain head bleeds -ID following- appreciate their assistance.  May need to reescalate antibiotics. -recultured on 8/25; BAL culture from 8/24 pending  -NE, vasopressin to maintain MAP >65  Hyperglycemia > not controlled at all with [place] insulin despite aggressive upward titration.  Assume poor absorption, potentially due to subcutaneous edema.   Remains elevated on IV. -To remain on insulin infusion -Goal BG 140-180 while admitted to the ICU -Continue linagliptin to decrease insulin requirements  Constipation, resolved  Hypernatremia; resolved -ok to let sodium normalize -Not requiring free water  Acute anemia due to critical illness Acute thrombocytopenia- refractory to platelet transfusion, not better after circuit change Coagulopathy- concern for underlying liver dysfunction -transfuse for Hb <8 -con't to monitor    Hyperbilirubinemia (increased direct and indirect), elevated transaminases coagulopathy Concern for progressive MOF -Appreciate palliative care's input. -Family understands her concern of progressive multiorgan failure and that aggressive therapy has not been enough to stop progression.  Her heart rate will depend on her ability to heal and return to a more compensated state.     Best practice:  Diet: tube feeding Pain/Anxiety/Delirium protocol (if indicated): as above VAP protocol (if indicated): yes DVT prophylaxis: SCDs , very low dose heparin GI prophylaxis: pantoprazole Glucose control:  insulin infusion  Mobility: bed rest Code Status: limited Family Communication: husband [name] updated via phone  Disposition: ICU  This patient is critically ill with multiple organ system failure which requires frequent high complexity decision making, assessment, support, evaluation, and titration of therapies. This was completed through the application of advanced monitoring technologies and extensive interpretation of multiple databases. During this encounter critical care time was devoted to patient care services described in this note for 57 minutes.   Julian Hy, DO 02/01/20 10:51 AM [facility]

## 2020-02-01 NOTE — Progress Notes (Signed)
[facility] for Infectious Disease  Date of Admission:  01/29/2020     Total days of antibiotics 19         ASSESSMENT:  [facility] Stephenson remains on multiple life support systems including ECMO, CRRT and requiring vasopressor support. She is having peripheral duskiness likely from need for vasopressors. Blood cultures drawn yesterday are without growth in <12 hours. Leukocytosis remains stable at 19. MSSA and Enterococcus should are covered with pip/tazo. There does not appear to be any evidence of MRSA so adding additional antibiotics at this time would not likely provide much additional benefit. She remains critically ill and requiring additional vasopressor support. Will continue current dose of Zoysn for now.   PLAN:  1. Continue current dose of Zoysn.  2. ECMO per primary team/CCM 3. CRRT per nephrology.  4. Agree with continued discussion of goals of care.   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, managed as type 1 ([place])   Pneumonia due to COVID-19 virus   Intracerebral hemorrhage   Personal history of ECMO   Palliative care by specialist   Goals of care, counseling/discussion    sodium chloride   Intravenous Once   sodium chloride   Intravenous Once   vitamin C  500 mg Per Tube Daily   B-complex with vitamin C  1 tablet Oral Daily   chlorhexidine gluconate (MEDLINE KIT)  15 mL Mouth Rinse BID   Chlorhexidine Gluconate Cloth  6 each Topical Daily   clonazePAM  1 mg Per Tube Q6H   docusate  100 mg Per Tube BID   feeding supplement (PROSource TF)  45 mL Per Tube TID   fentaNYL (SUBLIMAZE) injection  50 mcg Intravenous Once   linagliptin  5 mg Per Tube Daily   mouth rinse  15 mL Mouth Rinse 10 times per day   oxyCODONE  10 mg Per Tube Q6H   pantoprazole (PROTONIX) IV  40 mg Intravenous Daily   polyethylene glycol   17 g Per Tube Daily   sennosides  5 mL Per Tube BID   zinc sulfate  220 mg Per Tube Daily    SUBJECTIVE:  Al[facility] Stephenson remains on ECMO, CRRT and requiring vasopressor support. Platelets improved slightly.   Allergies  Allergen Reactions   Sulfa Antibiotics Rash and Other (See Comments)     Review of Systems: Review of Systems  Unable to perform ROS: Intubated      OBJECTIVE: Vitals:   02/01/20 1100 02/01/20 1200 02/01/20 1210 02/01/20 1300  BP: 129/61 (!) 104/33 (!) 146/48 (!) 119/48  Pulse: (!) 106 92 92 93  Resp:  (!) 26    Temp: (!) 96.4 F (35.8 C) (!) 96.1 F (35.6 C) (!) 96.1 F (35.6 C) (!) 96.3 F (35.7 C)  TempSrc:  Esophageal    SpO2: (!) 85% 93% (!) 88% (!) 88%  Weight:      Height:       Body mass index is 50.52 kg/m.  Physical Exam Constitutional:      General: She is not in acute distress.    Appearance: She is obese. She is ill-appearing.     Interventions: She is sedated and intubated.     Comments: Lying in bed; sedated.   Cardiovascular:     Rate and Rhythm: Normal rate and regular rhythm.     Heart sounds: Normal  heart sounds.  Pulmonary:     Effort: Pulmonary effort is normal. She is intubated.     Breath sounds: Normal breath sounds.  Skin:    General: Skin is warm and dry.  Psychiatric:        Thought Content: Thought content normal.        Judgment: Judgment normal.     Lab Results Lab Results  Component Value Date   WBC 19.0 (H) 02/01/2020   HGB 8.5 (L) 02/01/2020   HCT 25.0 (L) 02/01/2020   MCV 99.2 02/01/2020   PLT 46 (L) 02/01/2020    Lab Results  Component Value Date   CREATININE 1.05 (H) 02/01/2020   BUN 50 (H) 02/01/2020   NA 134 (L) 02/01/2020   K 4.4 02/01/2020   CL 101 02/01/2020   CO2 23 02/01/2020    Lab Results  Component Value Date   ALT 118 (H) 02/01/2020   AST 245 (H) 02/01/2020   ALKPHOS 204 (H) 02/01/2020   BILITOT 1.9 (H) 02/01/2020     Microbiology: Recent Results (from the past 240  hour(s))  Culture, blood (Routine X 2) w Reflex to ID Panel     Status: None   Collection Time: 01/25/20  1:15 PM   Specimen: BLOOD  Result Value Ref Range Status   Specimen Description BLOOD LEFT ANTECUBITAL  Final   Special Requests   Final    BOTTLES DRAWN AEROBIC AND ANAEROBIC Blood Culture adequate volume   Culture   Final    NO GROWTH 5 DAYS Performed at [facility], [address], [place], [place] [address]    Report Status 01/30/2020 FINAL  Final  Culture, blood (Routine X 2) w Reflex to ID Panel     Status: None   Collection Time: 01/25/20  1:23 PM   Specimen: BLOOD LEFT FOREARM  Result Value Ref Range Status   Specimen Description BLOOD LEFT FOREARM  Final   Special Requests   Final    BOTTLES DRAWN AEROBIC AND ANAEROBIC Blood Culture adequate volume   Culture   Final    NO GROWTH 5 DAYS Performed at [facility], [place] [address] [place], [place] [address]    Report Status 01/30/2020 FINAL  Final  SARS Coronavirus 2 by RT PCR (hospital order, performed in [facility] hospital lab) Nasopharyngeal     Status: None   Collection Time: 01/29/20  9:00 AM   Specimen: Nasopharyngeal  Result Value Ref Range Status   SARS Coronavirus 2 NEGATIVE NEGATIVE Final    Comment: (NOTE) SARS-CoV-2 target nucleic acids are NOT DETECTED.  The SARS-CoV-2 RNA is generally detectable in upper and lower respiratory specimens during the acute phase of infection. The lowest concentration of SARS-CoV-2 viral copies this assay can detect is 250 copies / mL. A negative result does not preclude SARS-CoV-2 infection and should not be used as the sole basis for treatment or other patient management decisions.  A negative result may occur with improper specimen collection / handling, submission of specimen other than nasopharyngeal swab, presence of viral mutation(s) within the areas targeted by this assay, and inadequate number of viral copies (<250 copies / mL). A negative result  must be combined with clinical observations, patient history, and epidemiological information.  Fact Sheet for Patients:   StrictlyIdeas.no  Fact Sheet for Healthcare Providers: BankingDealers.co.za  This test is not yet approved or  cleared by the [place] FDA and has been authorized for detection and/or diagnosis of  SARS-CoV-2 by FDA under an Emergency Use Authorization (EUA).  This EUA will remain in effect (meaning this test can be used) for the duration of the COVID-19 declaration under Section 564(b)(1) of the Act, 21 U.S.C. section 360bbb-3(b)(1), unless the authorization is terminated or revoked sooner.  Performed at [facility], [place] [address], [place], [place] [address]   Culture, bal-quantitative     Status: Abnormal (Preliminary result)   Collection Time: 01/30/20 10:36 AM   Specimen: Bronchial Alveolar Lavage; Respiratory  Result Value Ref Range Status   Specimen Description Bronch Lavag  Final   Special Requests NONE  Final   Gram Stain   Final    ABUNDANT WBC PRESENT,BOTH PMN AND MONONUCLEAR NO ORGANISMS SEEN    Culture (A)  Final    10,000 COLONIES/mL YEAST CULTURE REINCUBATED FOR BETTER GROWTH Performed at [facility], [address], [place], [place] [address]    Report Status PENDING  Incomplete  Culture, blood (routine x 2)     Status: None (Preliminary result)   Collection Time: 01/31/20  5:36 PM   Specimen: BLOOD RIGHT HAND  Result Value Ref Range Status   Specimen Description BLOOD RIGHT HAND  Final   Special Requests   Final    BOTTLES DRAWN AEROBIC ONLY Blood Culture results may not be optimal due to an inadequate volume of blood received in culture bottles   Culture   Final    NO GROWTH < 12 HOURS Performed at [facility], [place] [address] [place], [place] [address]    Report Status PENDING  Incomplete  Culture, blood (routine x 2)     Status: None (Preliminary result)    Collection Time: 01/31/20  5:50 PM   Specimen: BLOOD RIGHT HAND  Result Value Ref Range Status   Specimen Description BLOOD RIGHT HAND  Final   Special Requests   Final    BOTTLES DRAWN AEROBIC ONLY Blood Culture results may not be optimal due to an inadequate volume of blood received in culture bottles   Culture   Final    NO GROWTH < 12 HOURS Performed at [facility], [place] [address] [place], [place] [address]    Report Status PENDING  Incomplete     Terri Piedra, [place] [facility] Infectious [facility]  02/01/2020  1:44 PM

## 2020-02-01 NOTE — CV Procedure (Signed)
ECMO NOTE:  Indication: Respiratory failure due to COVID PNA  Initial cannulation date: 01/07/2020  ECMO type: VV ECMO (Centrimag with oximizer)  Dual lumen Inflow/return cannula: 32 FR RIJ [place] placed 02/02/2020  ECMO events:  - Initial cannulation 01/17/2020 - Cannula repositioned (pulled back ) 01/17/20 - Off bival 8/13 due to [place] - circuit change 8/24 and cannula pulled back  Daily data:  Flow5.0 RPM [address]  Labs:  ABG    Component Value Date/Time   PHART 7.370 02/01/2020 0635   PCO2ART 45.6 02/01/2020 0635   PO2ART 57 (L) 02/01/2020 0635   HCO3 26.6 02/01/2020 0635   TCO2 28 02/01/2020 0635   ACIDBASEDEF 1.0 01/31/2020 1703   O2SAT 89.0 02/01/2020 0635     BG remain high despite insulin drip-low 200s BUN/Cr improved on CRRT LDH 547> 655> 917> 1141> 1217> 1250 Fibrinogen  90 > 176> 316 INR 2.5>>1.9>>2.0> 2.0> 1.6> 1.4 PTT 38 HgB 8.5>>8.2>>8.7> 8.8> 7.8> 8.1 Plts 34>>37>>35> 30> 29> 31>30> 32> 46 WBC 13>>11>>11> 9.9> 7.9> 9.3> 11.3> 11.8> 19 LA 1.3  Assessment: -Severe COVID ARDS s/p VV ECMO -Enterococcal bacteremia> cleared -MSSA pneumonia; repeat cultures pending -Thrombocytopenia, refractory- some combination of hemolysis, bone marrow stunning, and sepsis effect, possibly contribution of chronic liver dysfunction remains likely - Intracerebral hemorrhage with question of septic emboli; on very low dose AC with new circuit -Ischemic digits both hands, right worse than left - Muscular deconditioning - Acute kidney failure requiring CVVHD - progressive MOF  Plan: -Continue VV ECMO support. No significant change in thrombocytopenia post-circuit change.  -Con't MV ultra-lung protective -Low-dose heparin for circuit change and to prevent clotting of new circuit -Continue CVVHD -Continue insulin gtt-does not absorb subcu insulin -zosyn per ID, TEE at some point when safer.  Continue empirically treating for endocarditis given concern for  vascular emboli. -Limited code, daily discussions with family. Family meeting with multiple extended family members on 8/25. -Off covid isolation due to cleared viral load  Multidisciplinary rounds with cardiology, critical care medicine, cardiothoracic surgery, coordinator, PharmDs, RTs, and all nurses present.  Julian Hy, DO 02/01/20 8:56 AM [facility]

## 2020-02-01 NOTE — Progress Notes (Signed)
[facility] NEPHROLOGY PROGRESS NOTE  Assessment/ Plan: Pt is a 54 y.o. yo female  With PMH of DM2, obesity, NAFLD who present w/ acute hypoxic respiratory failure due to Covid.  Hospitalization complicated by severe shock, refractory hypoxia requiring ECMO, multiple infections, intracranial hemorrhage, encephalopathy, AKI.  # Anuric AKI due to shock, sepsis causing acute tubular necrosis: Started CRRT on 8/22 for uremia and volume overload.  She has ECMO. No clinical improvement.  She has fluid overload and gained significant amount of weight.  Only 1 L of net negative UF, limited by hypotension. She is on multiple pressors and inotropes.   Continue CRRT, prognosis grim.  #Shock due to sepsis, Covid infection, Enterococcus bacteremia: On pressors, antibiotics per primary team.  #Acute hypoxic and hypercapnic respiratory failure, ARDS due to COVID-19 pneumonia: Currently on ECMO for refractory hypoxemia, MSSA pneumonia.  On broad-spectrum antibiotics.   #Hypernatremia, hypervolemia: relative free water deficit.  Improved with CRRT.  #Acute toxic metabolic encephalopathy: Multifactorial with uremia contributing.  Treatment with CRRT as above.  #Anemia, thrombocytopenia: With hemolysis likely and acute illness contributing.  Transfusions per primary team.  Hematology is following.  #Intracranial hemorrhage: With some left-sided weakness preceded by primary team.  Avoiding heparin products as able.  #GOC: Multiorgan failure.  Had family meeting on 8/25, continue full scope of treatment.  Long-term prognosis poor.  Discussed with the cardiology team.   Subjective: Seen and examined.  CRRT and ECMO running.  No urine output.  Running CRRT however unable to ultrafiltrate.  No new event and no issue with CRRT filter.  Took of fluid net negative around a liter.  Objective Vital signs in last 24 hours: Vitals:   02/01/20 0500 02/01/20 0600 02/01/20 0700 02/01/20 0742  BP:  (!) 116/50 (!) 112/39 (!) 122/51   Pulse: 87 94 94   Resp:      Temp: (!) 97.5 F (36.4 C) (!) 94.5 F (34.7 C) (!) 96.8 F (36 C)   TempSrc:      SpO2: 94% (!) 85% (!) 88% (!) 89%  Weight: 133.5 kg     Height:       Weight change: -0.5 kg  Intake/Output Summary (Last 24 hours) at 02/01/2020 0758 Last data filed at 02/01/2020 0700 Gross per 24 hour  Intake 5622.01 ml  Output 6962 ml  Net -1339.99 ml       Labs: Basic Metabolic Panel: Recent Labs  Lab 01/30/20 0324 01/30/20 0538 01/31/20 0300 01/31/20 0448 01/31/20 1541 01/31/20 1703 01/31/20 1946 02/01/20 0226 02/01/20 0635  NA 141   < > 134*   < > 135   < > 137 137 137  K 4.7   < > 3.9   < > 4.0   < > 3.9 4.3 4.3  CL 103   < > 101  --  101  --   --  101  --   CO2 25   < > 22  --  24  --   --  23  --   GLUCOSE 164*   < > 278*  --  218*  --   --  207*  --   BUN 95*   < > 73*  --  59*  --   --  50*  --   CREATININE 1.31*   < > 1.26*  --  1.16*  --   --  1.05*  --   CALCIUM 8.0*   < > 7.1*  --  7.3*  --   --  7.3*  --   PHOS 6.0*  --  6.1*  --   --   --   --  4.4  --    < > = values in this interval not displayed.   Liver Function Tests: Recent Labs  Lab 01/30/20 0324 01/31/20 0300 02/01/20 0226  AST 123* 206* 245*  ALT 69* 88* 118*  ALKPHOS 139* 155* 204*  BILITOT 1.8* 1.6* 1.9*  PROT 5.3* 4.9* 4.9*  ALBUMIN 2.9* 2.4* 2.3*   No results for input(s): LIPASE, AMYLASE in the last 168 hours. No results for input(s): AMMONIA in the last 168 hours. CBC: Recent Labs  Lab 01/30/20 1817 01/30/20 1818 01/31/20 0032 01/31/20 0032 01/31/20 0300 01/31/20 0448 01/31/20 1541 01/31/20 1703 01/31/20 1946 02/01/20 0226 02/01/20 0635  WBC 12.6*   < > 11.0*   < > 11.8*  --  17.7*  --   --  19.0*  --   HGB 7.5*   < > 8.5*   < > 8.2*   < > 8.8*   < > 8.2* 8.1* 8.2*  HCT 23.7*   < > 25.7*   < > 25.9*   < > 26.6*   < > 24.0* 25.4* 24.0*  MCV 98.8  --  98.5  --  98.1  --  99.3  --   --  99.2  --   PLT 30*   < >  34*   < > 32*  --  41*  --   --  46*  --    < > = values in this interval not displayed.   Cardiac Enzymes: No results for input(s): CKTOTAL, CKMB, CKMBINDEX, TROPONINI in the last 168 hours. CBG: Recent Labs  Lab 02/01/20 0238 02/01/20 0341 02/01/20 0441 02/01/20 0546 02/01/20 0633  GLUCAP 201* 200* 157* 175* 202*    Iron Studies: No results for input(s): IRON, TIBC, TRANSFERRIN, FERRITIN in the last 72 hours. Studies/Results: DG CHEST PORT 1 VIEW  Result Date: 01/31/2020 CLINICAL DATA:  Respiratory failure EXAM: PORTABLE CHEST 1 VIEW COMPARISON:  January 30, 2020 FINDINGS: Endotracheal tube tip is 4.5 cm above the carina. ECMO catheter present on the right with tip below diaphragm. Feeding tube tip is below the diaphragm. Left jugular catheter tip is in the superior vena cava. No pneumothorax. There is persistent diffuse airspace opacity bilaterally with suspected underlying pleural effusions. Grossly stable cardiac silhouette. IMPRESSION: Tube and catheter positions as described without pneumothorax. Diffuse airspace opacity bilaterally with suspected associated pleural effusions. Appearance indicative of ARDS superimposed with infection and/or edema. No appreciable change from 1 day prior. Electronically Signed   By: Lowella Grip III M.D.   On: 01/31/2020 08:24   DG CHEST PORT 1 VIEW  Result Date: 01/30/2020 CLINICAL DATA:  ECMO.  ARDS due to COVID 19 virus. EXAM: PORTABLE CHEST 1 VIEW COMPARISON:  One-view chest x-ray 01/30/2019 FINDINGS: Heart is obscured by airspace disease. The tip of the ECMO catheter is at the level of T12, the inferior cavoatrial junction. Left IJ line, endotracheal tube, and enteric tube are stable. The lungs are diffusely opacified bilaterally. Air bronchograms are noted. IMPRESSION: 1. Stable appearance of diffuse bilateral interstitial and airspace disease, compatible ARDS/infection. 2. The tip of the ECMO catheter is at the level of T12, the inferior  cavoatrial junction. Electronically Signed   By: San Morelle M.D.   On: 01/30/2020 14:09   DG Abd Portable 1V  Result Date: 01/30/2020 CLINICAL DATA:  Feeding tube placement. EXAM: PORTABLE ABDOMEN -  1 VIEW COMPARISON:  Plain film of the abdomen dated 01/26/2020. FINDINGS: Weighted tip feeding tube appears appropriately positioned in the stomach with tip directed towards the stomach pylorus/duodenal bulb. ECMO cannula in place. IMPRESSION: Weighted tip feeding tube appears appropriately positioned in the stomach with tip directed towards the stomach pylorus/duodenal bulb. Electronically Signed   By: Franki Cabot M.D.   On: 01/30/2020 11:31    Medications: Infusions: . sodium chloride 250 mL (01/29/20 1444)  . albumin human 12.5 g (01/28/20 1229)  . albumin human    . amiodarone 30 mg/hr (02/01/20 0700)  . dexmedetomidine (PRECEDEX) IV infusion 1 mcg/kg/hr (02/01/20 0700)  . feeding supplement (PIVOT 1.5 CAL) 75 mL/hr at 02/01/20 0600  . fentaNYL infusion INTRAVENOUS 350 mcg/hr (02/01/20 0700)  . heparin 400 Units/hr (02/01/20 0700)  . insulin 19 mL/hr at 02/01/20 0700  . milrinone 0.125 mcg/kg/min (02/01/20 0700)  . norepinephrine (LEVOPHED) Adult infusion 18 mcg/min (02/01/20 0700)  . piperacillin-tazobactam (ZOSYN)  IV Stopped (02/01/20 0612)  . prismasol BGK 2/2.5 replacement solution 500 mL/hr at 02/01/20 0604  . prismasol BGK 2/2.5 replacement solution 300 mL/hr at 01/31/20 1613  . prismasol BGK 4/2.5 2,000 mL/hr at 02/01/20 0728  . vasopressin 0.04 Units/min (02/01/20 0700)    Scheduled Medications: . sodium chloride   Intravenous Once  . sodium chloride   Intravenous Once  . amiodarone  150 mg Intravenous Once  . vitamin C  500 mg Per Tube Daily  . B-complex with vitamin C  1 tablet Oral Daily  . chlorhexidine gluconate (MEDLINE KIT)  15 mL Mouth Rinse BID  . Chlorhexidine Gluconate Cloth  6 each Topical Daily  . clonazePAM  1 mg Per Tube Q6H  . docusate  100 mg  Per Tube BID  . feeding supplement (PROSource TF)  45 mL Per Tube TID  . fentaNYL (SUBLIMAZE) injection  50 mcg Intravenous Once  . linagliptin  5 mg Per Tube Daily  . mouth rinse  15 mL Mouth Rinse 10 times per day  . oxyCODONE  10 mg Per Tube Q6H  . pantoprazole (PROTONIX) IV  40 mg Intravenous Daily  . polyethylene glycol  17 g Per Tube Daily  . sennosides  5 mL Per Tube BID  . vecuronium  10 mg Intravenous Once  . zinc sulfate  220 mg Per Tube Daily    have reviewed scheduled and prn medications.  Physical Exam: General: Critically ill female, sedated, has ECMO running. Heart:RRR, s1s2 nl Lungs: Coarse breath sound bilateral Abdomen:soft, non-distended Extremities: Anasarca and all extremities has edema. Dialysis Access: Temporary HD catheter  Leota Maka Tanna Furry 02/01/2020,7:58 AM  LOS: 24 days  Pager: [phone]

## 2020-02-01 NOTE — Progress Notes (Signed)
OT Cancellation Note  Patient Details Name: CAROLENE GITTO MRN: [id number] DOB: 1966/04/28   Cancelled Treatment:    Reason Eval/Treat Not Completed: Medical issues which prohibited therapy ; remains sedated at this time and unable to participate in therapies. Will continue to follow.  Lou Cal, OT Acute Rehabilitation Services Pager [phone] Office [phone]  Raymondo Band 02/01/2020, 8:59 AM

## 2020-02-01 NOTE — Progress Notes (Addendum)
ANTICOAGULATION CONSULT NOTE - [facility] for Heparin Indication: ECMO  Allergies  Allergen Reactions  . Sulfa Antibiotics Rash and Other (See Comments)    Patient Measurements: Height: 5\' 4"  (162.6 cm) Weight: 133.5 kg (294 lb 5 oz) IBW/kg (Calculated) : 54.7 Heparin Dosing Weight: 87 kg  Vital Signs: Temp: 96.8 F (36 C) (08/26 0800) Temp Source: Esophageal (08/26 0800) BP: 113/48 (08/26 0800) Pulse Rate: 113 (08/26 0800)  Labs: Recent Labs    01/30/20 0324 01/30/20 0538 01/31/20 0032 01/31/20 0258 01/31/20 0300 01/31/20 0448 01/31/20 1541 01/31/20 1703 01/31/20 1946 01/31/20 1946 02/01/20 0226 02/01/20 0635  HGB 8.5*   < >   < >  --  8.2*   < > 8.8*   < > 8.2*   < > 8.1* 8.2*  HCT 27.3*   < >   < >  --  25.9*   < > 26.6*   < > 24.0*  --  25.4* 24.0*  PLT 30*   < >   < >  --  32*  --  41*  --   --   --  46*  --   APTT 41*   < >  --   --  38*  --  42*  --   --   --  38*  --   LABPROT 18.5*  --   --   --  16.6*  --   --   --   --   --  15.3*  --   INR 1.6*  --   --   --  1.4*  --   --   --   --   --  1.3*  --   HEPARINUNFRC  --   --   --  <0.10*  --   --   --   --   --   --  <0.10*  --   CREATININE 1.31*   < >  --   --  1.26*  --  1.16*  --   --   --  1.05*  --    < > = values in this interval not displayed.    Estimated Creatinine Clearance: 82.4 mL/min (A) (by C-G formula based on SCr of 1.05 mg/dL (H)).   Medical History: Past Medical History:  Diagnosis Date  . Asthma   . Diabetes mellitus without complication ([place])   . Diverticulitis   . Gallstones   . IBS (irritable bowel syndrome)   . NAFLD (nonalcoholic fatty liver disease)    CT scan 2015  . Ovarian cyst     Assessment: 54 yo female who is COVID+ on VV ECMO. Patient was started on bivalirudin but was found to have multiple ICH on 8/13 head CT. Bivalirudin was stopped at this time and pt has been off anticoagulation. Her ECMO circuit was changed 8/24, and pharmacy  asked to start low-fixed rate heparin.  Heparin level remains undetectable on fixed, low-dose heparin. Will keep rate the same and not titrate to goal at this time per discussion with ECMO team. LDH up to 1250 today. Fibrinogen also up to 316. Hgb stable 8.1, plts remain up slightly 46. RN reports bleeding overnight from HD catheter is slightly improved. ECMO circuit is stable.  Goal of Therapy:  Heparin level 0.2-0.5 units/ml - currently not titrating to goal per discussion with ECMO team Monitor platelets by anticoagulation protocol: Yes   Plan:  Continue heparin 400 units/hr at fixed rate Check daily  HL Monitor daily CBC, signs of bleeding, and ECMO circuit  Richardine Service, PharmD PGY2 Cardiology Pharmacy Resident Phone: [phone] 02/01/2020  8:41 AM  Please check [email or web address] for unit-specific pharmacy phone numbers.

## 2020-02-02 ENCOUNTER — Inpatient Hospital Stay (HOSPITAL_COMMUNITY): Payer: PRIVATE HEALTH INSURANCE

## 2020-02-02 DIAGNOSIS — R579 Shock, unspecified: Secondary | ICD-10-CM

## 2020-02-02 LAB — POCT I-STAT 7, (LYTES, BLD GAS, ICA,H+H)
Acid-Base Excess: 0 mmol/L (ref 0.0–2.0)
Acid-Base Excess: 0 mmol/L (ref 0.0–2.0)
Acid-Base Excess: 1 mmol/L (ref 0.0–2.0)
Acid-Base Excess: 3 mmol/L — ABNORMAL HIGH (ref 0.0–2.0)
Acid-Base Excess: 0 mmol/L (ref 0.0–2.0)
Acid-base deficit: 2 mmol/L (ref 0.0–2.0)
Acid-base deficit: 1 mmol/L (ref 0.0–2.0)
Bicarbonate: 26.8 mmol/L (ref 20.0–28.0)
Bicarbonate: 26.1 mmol/L (ref 20.0–28.0)
Bicarbonate: 26.3 mmol/L (ref 20.0–28.0)
Bicarbonate: 26.9 mmol/L (ref 20.0–28.0)
Bicarbonate: 27.2 mmol/L (ref 20.0–28.0)
Bicarbonate: 23.8 mmol/L (ref 20.0–28.0)
Bicarbonate: 24 mmol/L (ref 20.0–28.0)
Calcium, Ion: 1.13 mmol/L — ABNORMAL LOW (ref 1.15–1.40)
Calcium, Ion: 1.1 mmol/L — ABNORMAL LOW (ref 1.15–1.40)
Calcium, Ion: 1.13 mmol/L — ABNORMAL LOW (ref 1.15–1.40)
Calcium, Ion: 1.14 mmol/L — ABNORMAL LOW (ref 1.15–1.40)
Calcium, Ion: 1.07 mmol/L — ABNORMAL LOW (ref 1.15–1.40)
Calcium, Ion: 1.11 mmol/L — ABNORMAL LOW (ref 1.15–1.40)
Calcium, Ion: 1.03 mmol/L — ABNORMAL LOW (ref 1.15–1.40)
HCT: 23 % — ABNORMAL LOW (ref 36.0–46.0)
HCT: 24 % — ABNORMAL LOW (ref 36.0–46.0)
HCT: 49 % — ABNORMAL HIGH (ref 36.0–46.0)
HCT: 27 % — ABNORMAL LOW (ref 36.0–46.0)
HCT: 28 % — ABNORMAL LOW (ref 36.0–46.0)
HCT: 26 % — ABNORMAL LOW (ref 36.0–46.0)
HCT: 23 % — ABNORMAL LOW (ref 36.0–46.0)
Hemoglobin: 7.8 g/dL — ABNORMAL LOW (ref 12.0–15.0)
Hemoglobin: 8.2 g/dL — ABNORMAL LOW (ref 12.0–15.0)
Hemoglobin: 16.7 g/dL — ABNORMAL HIGH (ref 12.0–15.0)
Hemoglobin: 9.2 g/dL — ABNORMAL LOW (ref 12.0–15.0)
Hemoglobin: 9.5 g/dL — ABNORMAL LOW (ref 12.0–15.0)
Hemoglobin: 8.8 g/dL — ABNORMAL LOW (ref 12.0–15.0)
Hemoglobin: 7.8 g/dL — ABNORMAL LOW (ref 12.0–15.0)
O2 Saturation: 89 %
O2 Saturation: 85 %
O2 Saturation: 83 %
O2 Saturation: 81 %
O2 Saturation: 100 %
O2 Saturation: 81 %
O2 Saturation: 94 %
Patient temperature: 36.6
Patient temperature: 36
Patient temperature: 36.1
Patient temperature: 36.3
Patient temperature: 36.6
Patient temperature: 36.6
Patient temperature: 36.6
Potassium: 4.3 mmol/L (ref 3.5–5.1)
Potassium: 4.5 mmol/L (ref 3.5–5.1)
Potassium: 4.1 mmol/L (ref 3.5–5.1)
Potassium: 4.1 mmol/L (ref 3.5–5.1)
Potassium: 4.3 mmol/L (ref 3.5–5.1)
Potassium: 4.1 mmol/L (ref 3.5–5.1)
Potassium: 4.2 mmol/L (ref 3.5–5.1)
Sodium: 137 mmol/L (ref 135–145)
Sodium: 138 mmol/L (ref 135–145)
Sodium: 138 mmol/L (ref 135–145)
Sodium: 136 mmol/L (ref 135–145)
Sodium: 137 mmol/L (ref 135–145)
Sodium: 137 mmol/L (ref 135–145)
Sodium: 140 mmol/L (ref 135–145)
TCO2: 28 mmol/L (ref 22–32)
TCO2: 28 mmol/L (ref 22–32)
TCO2: 28 mmol/L (ref 22–32)
TCO2: 28 mmol/L (ref 22–32)
TCO2: 28 mmol/L (ref 22–32)
TCO2: 25 mmol/L (ref 22–32)
TCO2: 25 mmol/L (ref 22–32)
pCO2 arterial: 50.6 mmHg — ABNORMAL HIGH (ref 32.0–48.0)
pCO2 arterial: 46.8 mmHg (ref 32.0–48.0)
pCO2 arterial: 53.3 mmHg — ABNORMAL HIGH (ref 32.0–48.0)
pCO2 arterial: 47.4 mmHg (ref 32.0–48.0)
pCO2 arterial: 40.7 mmHg (ref 32.0–48.0)
pCO2 arterial: 41.2 mmHg (ref 32.0–48.0)
pCO2 arterial: 36.2 mmHg (ref 32.0–48.0)
pH, Arterial: 7.329 — ABNORMAL LOW (ref 7.350–7.450)
pH, Arterial: 7.35 (ref 7.350–7.450)
pH, Arterial: 7.296 — ABNORMAL LOW (ref 7.350–7.450)
pH, Arterial: 7.358 (ref 7.350–7.450)
pH, Arterial: 7.432 (ref 7.350–7.450)
pH, Arterial: 7.368 (ref 7.350–7.450)
pH, Arterial: 7.427 (ref 7.350–7.450)
pO2, Arterial: 59 mmHg — ABNORMAL LOW (ref 83.0–108.0)
pO2, Arterial: 50 mmHg — ABNORMAL LOW (ref 83.0–108.0)
pO2, Arterial: 51 mmHg — ABNORMAL LOW (ref 83.0–108.0)
pO2, Arterial: 46 mmHg — ABNORMAL LOW (ref 83.0–108.0)
pO2, Arterial: 379 mmHg — ABNORMAL HIGH (ref 83.0–108.0)
pO2, Arterial: 46 mmHg — ABNORMAL LOW (ref 83.0–108.0)
pO2, Arterial: 65 mmHg — ABNORMAL LOW (ref 83.0–108.0)

## 2020-02-02 LAB — CBC
HCT: 24.4 % — ABNORMAL LOW (ref 36.0–46.0)
HCT: 26.5 % — ABNORMAL LOW (ref 36.0–46.0)
HCT: 24.9 % — ABNORMAL LOW (ref 36.0–46.0)
Hemoglobin: 7.7 g/dL — ABNORMAL LOW (ref 12.0–15.0)
Hemoglobin: 8.4 g/dL — ABNORMAL LOW (ref 12.0–15.0)
Hemoglobin: 8.1 g/dL — ABNORMAL LOW (ref 12.0–15.0)
MCH: 32.6 pg (ref 26.0–34.0)
MCH: 31.8 pg (ref 26.0–34.0)
MCH: 32.8 pg (ref 26.0–34.0)
MCHC: 31.6 g/dL (ref 30.0–36.0)
MCHC: 31.7 g/dL (ref 30.0–36.0)
MCHC: 32.5 g/dL (ref 30.0–36.0)
MCV: 103.4 fL — ABNORMAL HIGH (ref 80.0–100.0)
MCV: 100.4 fL — ABNORMAL HIGH (ref 80.0–100.0)
MCV: 100.8 fL — ABNORMAL HIGH (ref 80.0–100.0)
Platelets: 61 10*3/uL — ABNORMAL LOW (ref 150–400)
Platelets: 57 10*3/uL — ABNORMAL LOW (ref 150–400)
Platelets: 50 10*3/uL — ABNORMAL LOW (ref 150–400)
RBC: 2.36 MIL/uL — ABNORMAL LOW (ref 3.87–5.11)
RBC: 2.64 MIL/uL — ABNORMAL LOW (ref 3.87–5.11)
RBC: 2.47 MIL/uL — ABNORMAL LOW (ref 3.87–5.11)
RDW: 23.9 % — ABNORMAL HIGH (ref 11.5–15.5)
RDW: 22.7 % — ABNORMAL HIGH (ref 11.5–15.5)
RDW: 22.8 % — ABNORMAL HIGH (ref 11.5–15.5)
WBC: 20.2 10*3/uL — ABNORMAL HIGH (ref 4.0–10.5)
WBC: 16.8 10*3/uL — ABNORMAL HIGH (ref 4.0–10.5)
WBC: 14 10*3/uL — ABNORMAL HIGH (ref 4.0–10.5)
nRBC: 23.1 % — ABNORMAL HIGH (ref 0.0–0.2)
nRBC: 20.8 % — ABNORMAL HIGH (ref 0.0–0.2)
nRBC: 18.8 % — ABNORMAL HIGH (ref 0.0–0.2)

## 2020-02-02 LAB — BASIC METABOLIC PANEL
Anion gap: 11 (ref 5–15)
Anion gap: 12 (ref 5–15)
BUN: 42 mg/dL — ABNORMAL HIGH (ref 6–20)
BUN: 49 mg/dL — ABNORMAL HIGH (ref 6–20)
CO2: 24 mmol/L (ref 22–32)
CO2: 23 mmol/L (ref 22–32)
Calcium: 7.7 mg/dL — ABNORMAL LOW (ref 8.9–10.3)
Calcium: 7.6 mg/dL — ABNORMAL LOW (ref 8.9–10.3)
Chloride: 100 mmol/L (ref 98–111)
Chloride: 101 mmol/L (ref 98–111)
Creatinine, Ser: 1.1 mg/dL — ABNORMAL HIGH (ref 0.44–1.00)
Creatinine, Ser: 1.2 mg/dL — ABNORMAL HIGH (ref 0.44–1.00)
GFR calc Af Amer: >60 mL/min (ref >60)
GFR calc Af Amer: 59 mL/min — ABNORMAL LOW (ref >60)
GFR calc non Af Amer: 56 mL/min — ABNORMAL LOW (ref >60)
GFR calc non Af Amer: 51 mL/min — ABNORMAL LOW (ref >60)
Glucose, Bld: 182 mg/dL — ABNORMAL HIGH (ref 70–99)
Glucose, Bld: 224 mg/dL — ABNORMAL HIGH (ref 70–99)
Potassium: 4.2 mmol/L (ref 3.5–5.1)
Potassium: 4.1 mmol/L (ref 3.5–5.1)
Sodium: 135 mmol/L (ref 135–145)
Sodium: 136 mmol/L (ref 135–145)

## 2020-02-02 LAB — GLUCOSE, CAPILLARY
Glucose-Capillary: 118 mg/dL — ABNORMAL HIGH (ref 70–99)
Glucose-Capillary: 137 mg/dL — ABNORMAL HIGH (ref 70–99)
Glucose-Capillary: 143 mg/dL — ABNORMAL HIGH (ref 70–99)
Glucose-Capillary: 155 mg/dL — ABNORMAL HIGH (ref 70–99)
Glucose-Capillary: 161 mg/dL — ABNORMAL HIGH (ref 70–99)
Glucose-Capillary: 162 mg/dL — ABNORMAL HIGH (ref 70–99)
Glucose-Capillary: 163 mg/dL — ABNORMAL HIGH (ref 70–99)
Glucose-Capillary: 170 mg/dL — ABNORMAL HIGH (ref 70–99)
Glucose-Capillary: 172 mg/dL — ABNORMAL HIGH (ref 70–99)
Glucose-Capillary: 176 mg/dL — ABNORMAL HIGH (ref 70–99)
Glucose-Capillary: 181 mg/dL — ABNORMAL HIGH (ref 70–99)
Glucose-Capillary: 185 mg/dL — ABNORMAL HIGH (ref 70–99)
Glucose-Capillary: 195 mg/dL — ABNORMAL HIGH (ref 70–99)
Glucose-Capillary: 196 mg/dL — ABNORMAL HIGH (ref 70–99)
Glucose-Capillary: 198 mg/dL — ABNORMAL HIGH (ref 70–99)
Glucose-Capillary: 203 mg/dL — ABNORMAL HIGH (ref 70–99)
Glucose-Capillary: 207 mg/dL — ABNORMAL HIGH (ref 70–99)
Glucose-Capillary: 211 mg/dL — ABNORMAL HIGH (ref 70–99)

## 2020-02-02 LAB — HEPATIC FUNCTION PANEL
ALT: 134 U/L — ABNORMAL HIGH (ref 0–44)
AST: 195 U/L — ABNORMAL HIGH (ref 15–41)
Albumin: 2.4 g/dL — ABNORMAL LOW (ref 3.5–5.0)
Alkaline Phosphatase: 259 U/L — ABNORMAL HIGH (ref 38–126)
Bilirubin, Direct: 0.9 mg/dL — ABNORMAL HIGH (ref 0.0–0.2)
Indirect Bilirubin: 1 mg/dL — ABNORMAL HIGH (ref 0.3–0.9)
Total Bilirubin: 1.9 mg/dL — ABNORMAL HIGH (ref 0.3–1.2)
Total Protein: 5.4 g/dL — ABNORMAL LOW (ref 6.5–8.1)

## 2020-02-02 LAB — PHOSPHORUS: Phosphorus: 3.7 mg/dL (ref 2.5–4.6)

## 2020-02-02 LAB — CULTURE, BAL-QUANTITATIVE W GRAM STAIN: Culture: 10000 — AB

## 2020-02-02 LAB — LACTIC ACID, PLASMA
Lactic Acid, Venous: 1.9 mmol/L (ref 0.5–1.9)
Lactic Acid, Venous: 2.3 mmol/L (ref 0.5–1.9)

## 2020-02-02 LAB — PREPARE RBC (CROSSMATCH)

## 2020-02-02 LAB — MAGNESIUM: Magnesium: 2.8 mg/dL — ABNORMAL HIGH (ref 1.7–2.4)

## 2020-02-02 LAB — LACTATE DEHYDROGENASE: LDH: 1288 U/L — ABNORMAL HIGH (ref 98–192)

## 2020-02-02 LAB — PROTIME-INR
INR: 1.3 — ABNORMAL HIGH (ref 0.8–1.2)
Prothrombin Time: 15.3 seconds — ABNORMAL HIGH (ref 11.4–15.2)

## 2020-02-02 LAB — FIBRINOGEN: Fibrinogen: 521 mg/dL — ABNORMAL HIGH (ref 210–475)

## 2020-02-02 LAB — APTT
aPTT: 43 seconds — ABNORMAL HIGH (ref 24–36)
aPTT: 43 seconds — ABNORMAL HIGH (ref 24–36)

## 2020-02-02 LAB — HEPARIN LEVEL (UNFRACTIONATED): Heparin Unfractionated: <0.1 IU/mL — ABNORMAL LOW (ref 0.30–0.70)

## 2020-02-02 MED ORDER — SODIUM CHLORIDE 0.9% IV SOLUTION: Freq: Once | INTRAVENOUS | Status: AC

## 2020-02-02 NOTE — Progress Notes (Signed)
[facility]    Date of Admission:  01/11/2020   Total days of antibiotics 16/ day 2 anidula           ID: Henritta L Holck is a 54 y.o. female with  Principal Problem:   Acute respiratory distress syndrome (ARDS) due to COVID-19 virus ([place]) Active Problems:   NAFLD (nonalcoholic fatty liver disease)   Diabetes mellitus type 1.5, managed as type 1 ([place])   Pneumonia due to COVID-19 virus   Intracerebral hemorrhage   Personal history of ECMO   Palliative care by specialist   Goals of care, counseling/discussion    Subjective: No oozing from sites, still has roughly same pressor requirements with cyanotic changes to left hand, right foot. Spontaneously opens eyes but not tracking  Medications:  . sodium chloride   Intravenous Once  . sodium chloride   Intravenous Once  . vitamin C  500 mg Per Tube Daily  . B-complex with vitamin C  1 tablet Oral Daily  . chlorhexidine gluconate (MEDLINE KIT)  15 mL Mouth Rinse BID  . Chlorhexidine Gluconate Cloth  6 each Topical Daily  . clonazePAM  1 mg Per Tube Q6H  . docusate  100 mg Per Tube BID  . feeding supplement (PROSource TF)  45 mL Per Tube TID  . fentaNYL (SUBLIMAZE) injection  50 mcg Intravenous Once  . linagliptin  5 mg Per Tube Daily  . mouth rinse  15 mL Mouth Rinse 10 times per day  . oxyCODONE  10 mg Per Tube Q6H  . pantoprazole (PROTONIX) IV  40 mg Intravenous Daily  . polyethylene glycol  17 g Per Tube Daily  . sennosides  5 mL Per Tube BID  . zinc sulfate  220 mg Per Tube Daily    Objective: Vital signs in last 24 hours: Temp:  [95.7 F (35.4 C)-98.1 F (36.7 C)] 97.3 F (36.3 C) (08/27 1200) Pulse Rate:  [86-133] 93 (08/27 1200) Resp:  [17-26] 17 (08/27 0630) BP: (123-153)/(55-57) 153/57 (08/26 2007) SpO2:  [77 %-95 %] 86 % (08/27 1200) Arterial Line BP: (109-188)/(12-63) 166/57 (08/27 1200) FiO2 (%):  [60 %] 60 % (08/27 0801) Weight:  [130.9 kg] 130.9 kg (08/27 0500) Physical Exam   Constitutional:  Opens eyes occasionally. appears well-developed and well-nourished. No distress.  HENT: /AT,  Conjunctival edema Mouth/Throat: OETT in place Cardiovascular: tachy, regular rhythm and normal heart sounds. Exam reveals no gallop and no friction rub.  No murmur heard.  Pulmonary/Chest: Effort normal and breath sounds mild rhonchi Neck = right sided ecmo cannula in place Abdominal: Soft. Bowel sounds are decreased Ext: pitting edema. Patient has evidence of cyanosis to left hand and right foot. Skin: Skin is warm and dry. No rash noted. No erythema.    Lab Results Recent Labs    02/01/20 1700 02/01/20 1856 02/02/20 0330 02/02/20 0436 02/02/20 1053 02/02/20 1139  WBC 20.9*  --  20.2*  --   --   --   HGB 8.1*   < > 7.7*   < > 9.2* 9.5*  HCT 25.5*   < > 24.4*   < > 27.0* 28.0*  NA 136   < > 135   < > 136 137  K 4.4   < > 4.2   < > 4.1 4.3  CL 102  --  100  --   --   --   CO2 25  --  24  --   --   --  BUN 44*  --  42*  --   --   --   CREATININE 1.08*  --  1.10*  --   --   --    < > = values in this interval not displayed.   Liver Panel Recent Labs    02/01/20 0226 02/02/20 0330  PROT 4.9* 5.4*  ALBUMIN 2.3* 2.4*  AST 245* 195*  ALT 118* 134*  ALKPHOS 204* 259*  BILITOT 1.9* 1.9*  BILIDIR 0.8* 0.9*  IBILI 1.1* 1.0*    Microbiology: reviewed Studies/Results: DG CHEST PORT 1 VIEW  Result Date: 02/02/2020 CLINICAL DATA:  ETT, respiratory failure, on ECMO EXAM: PORTABLE CHEST 1 VIEW COMPARISON:  02/01/2020 FINDINGS: Diffuse opacification of the lungs bilaterally with associated ECMO catheter. Endotracheal tube terminates 4.5 cm above the carina. Left IJ venous catheter terminates in the mid SVC. Enteric tube courses into the stomach. Cardiomediastinal silhouette is obscured. IMPRESSION: Diffuse opacification of the lungs bilaterally with associated ECMO catheter. Endotracheal tube terminates 4.5 cm above the carina. Additional support apparatus as above.  Electronically Signed   By: Julian Hy M.D.   On: 02/02/2020 07:43   DG CHEST PORT 1 VIEW  Result Date: 02/01/2020 CLINICAL DATA:  ECMO EXAM: PORTABLE CHEST 1 VIEW COMPARISON:  January 31, 2020 FINDINGS: The cardiomediastinal silhouette is obscured.ETT tip terminates 2.2 cm above the carina. The enteric tube courses through the chest to the abdomen beyond the field-of-view. Esophageal temperature probe. ECMO cannulation support apparatus. LEFT IJ CVC tip terminates over the confluence of LEFT brachiocephalic vein and the SVC. LEFT subclavian approach CVC tip terminates over the region of the LEFT subclavian vessels. RIGHT upper extremity PICC tip is obscured by overlying ECMO cannulation support apparatus but is favored to terminate over the region of the RIGHT subclavian vessels. No pneumothorax. Near complete opacification of bilateral lungs with scattered air bronchograms bilaterally. Visualized abdomen is unremarkable. Multilevel degenerative changes of the thoracic spine. IMPRESSION: 1.  Support apparatus as described above. 2. Near complete opacification of bilateral lungs with scattered air bronchograms bilaterally. Electronically Signed   By: Valentino Saxon MD   On: 02/01/2020 08:11     Assessment/Plan: Enterococcal bacteremia = unclear source if it was solely related to retained lines, which have now been subsequently changed out(including the circuit) continue on piptazo  Empirically started on antifungals in the setting of leukocytosis and pressor needs. Thus far, small amount yeast found on trach aspirate could be due to oropharyngeal colonization. For now, continue on anti-fungal  Overall condition still very much guarded. Prognosis remains poor if not having any significant improvement over the next few days.  [facility] Cell: [phone] Pager: [phone]  02/02/2020, 2:18 PM

## 2020-02-02 NOTE — Progress Notes (Signed)
CRITICAL VALUE ALERT  Critical Value:  Lactic 2.3  Date & Time Notied:  02/02/20 1650  Provider Notified: [name] MD   Orders Received/Actions taken: no new orders

## 2020-02-02 NOTE — Progress Notes (Signed)
ANTICOAGULATION CONSULT NOTE - [facility] for Heparin Indication: ECMO  Allergies  Allergen Reactions  . Sulfa Antibiotics Rash and Other (See Comments)    Patient Measurements: Height: 5\' 4"  (162.6 cm) Weight: 130.9 kg (288 lb 9.3 oz) IBW/kg (Calculated) : 54.7 Heparin Dosing Weight: 87 kg  Vital Signs: Temp: 97.2 F (36.2 C) (08/27 1000) Temp Source: Esophageal (08/27 0653) Pulse Rate: 104 (08/27 1000)  Labs: Recent Labs    01/31/20 0032 01/31/20 0258 01/31/20 0300 01/31/20 0448 02/01/20 0226 02/01/20 0635 02/01/20 1700 02/01/20 1856 02/02/20 0330 02/02/20 0330 02/02/20 0436 02/02/20 0624 02/02/20 0832  HGB   < >  --  8.2*   < > 8.1*   < > 8.1*   < > 7.7*   < > 7.8* 8.2*  --   HCT   < >  --  25.9*   < > 25.4*   < > 25.5*   < > 24.4*  --  23.0* 24.0*  --   PLT   < >  --  32*   < > 46*  --  53*  --  61*  --   --   --   --   APTT  --   --  38*   < > 38*  --  44*  --  43*  --   --   --   --   LABPROT  --   --  16.6*  --  15.3*  --   --   --  15.3*  --   --   --   --   INR  --   --  1.4*  --  1.3*  --   --   --  1.3*  --   --   --   --   HEPARINUNFRC  --  <0.10*  --   --  <0.10*  --   --   --   --   --   --   --  <0.10*  CREATININE  --   --  1.26*   < > 1.05*  --  1.08*  --  1.10*  --   --   --   --    < > = values in this interval not displayed.    Estimated Creatinine Clearance: 77.7 mL/min (A) (by C-G formula based on SCr of 1.1 mg/dL (H)).   Medical History: Past Medical History:  Diagnosis Date  . Asthma   . Diabetes mellitus without complication ([place])   . Diverticulitis   . Gallstones   . IBS (irritable bowel syndrome)   . NAFLD (nonalcoholic fatty liver disease)    CT scan 2015  . Ovarian cyst     Assessment: 54 yo female who is COVID+ on VV ECMO. Patient was started on bivalirudin but was found to have multiple ICH on 8/13 head CT. Bivalirudin was stopped at this time and pt has been off anticoagulation. Her ECMO circuit  was changed 8/24, and pharmacy asked to start low-fixed rate heparin.  Heparin level remains undetectable on fixed, low-dose heparin. Will keep rate the same and not titrate to goal at this time per discussion with ECMO team. LDH up to 1288 today. Fibrinogen also up to 521. Hgb down to 7.7 (receiving 1u PRBC today), plts up slightly 61. No bleeding per discussion with RN. ECMO circuit is stable.  Goal of Therapy:  Heparin level 0.2-0.5 units/ml - currently not titrating to goal per discussion with ECMO  team Monitor platelets by anticoagulation protocol: Yes   Plan:  Continue heparin 400 units/hr at fixed rate Check daily HL Monitor daily CBC, signs of bleeding, and ECMO circuit  Richardine Service, PharmD PGY2 Cardiology Pharmacy Resident Phone: [phone] 02/02/2020  10:31 AM  Please check [email or web address] for unit-specific pharmacy phone numbers.

## 2020-02-02 NOTE — Progress Notes (Signed)
Palliative Medicine RN Note: Rec'd a call from Mr Dickman ([phone]) for our NP Gregary Signs, who is off-service for the next several days. I relayed that information to him, and he stated that is fine; "we're not stopping." Mr Jaquess reports that they have been able to visit and communicate with Mrs Digilio this morning to let her know what is going on & to get confirmation she understands the situation.  Our team will continue to watch Mrs Mihalko's progress & remain available if her family requests to speak to [place].  Marjie Skiff Makenzee Choudhry, RN, BSN, [facility] Palliative Medicine Team 02/02/2020 12:16 PM Office [phone]

## 2020-02-02 NOTE — Consult Note (Signed)
Pt still on ECMO with multiple pressors dialysis vent dependent Left hand is dusky on the palmar aspect Early gangrene right first and 2nd digits right hand No large vessel obstruction.  Nothing really to add from vascular standpoint at this time Protect hands wean pressors if possible.  Call if questions.  Ruta Hinds, MD [facility] Office: [phone]

## 2020-02-02 NOTE — Progress Notes (Signed)
Advanced Heart Failure Rounding Note   Subjective:    - 8/2 COVID + test - 8/9 Cannulated for VV ECMO - 8/13 with several areas of intracranial hemorrhage. Bival stopped.  - 8/14 CT no change in [place]. Increased edema - 8/16 Extubated - 8/16 Head CT stable bleed - 8/22 CVVHD started - 8/24 reintubated - 8/24 circuit changed  Remains awake on vent. Not following commands for me this am.   Remains on CVVHD. Pulled over 5L yesterday. Mild chugging. Weight down 6 pounds. Remains anuric  Heparin in circuit at 400u/hr. Bleeding has slowed but hgb 7.7. Getting 1 u RBCs  LDH remains high. PLTs and fibrinogen climbing nicely with new circuit.   Eraxis started for fungal coverage with rising WBC and persistent pressor requirements. (NE 16 VP 0.04)  ECMO   Flow 5.11L RPM 4200 Sweep8 -> 12  Labs:  7.35/47/50/85% Hgb7.7 PLT 32k -> 46k LDH 429 -> 451 -> 576 -> 527 -> 547 -> 655 -> 917 -> 1,141 -> 1250 -> 1,288 Heparin level  < 0.10 Lactic acid1.3 -> 1.9 Fibrinogen 176 -> 316 -> 521  Objective:   Weight Range:  Vital Signs:   Temp:  [95.7 F (35.4 C)-98.1 F (36.7 C)] 97 F (36.1 C) (08/27 0800) Pulse Rate:  [86-133] 104 (08/27 0800) Resp:  [17-26] 17 (08/27 0630) BP: (104-153)/(33-61) 153/57 (08/26 2007) SpO2:  [77 %-95 %] 91 % (08/27 0801) Arterial Line BP: (107-188)/(12-91) 188/63 (08/27 0800) FiO2 (%):  [60 %] 60 % (08/27 0801) Weight:  [130.9 kg] 130.9 kg (08/27 0500) Last BM Date: 02/02/20  Weight change: Filed Weights   01/31/20 0630 02/01/20 0500 02/02/20 0500  Weight: 134 kg 133.5 kg 130.9 kg    Intake/Output:   Intake/Output Summary (Last 24 hours) at 02/02/2020 0835 Last data filed at 02/02/2020 0700 Gross per 24 hour  Intake 5236.28 ml  Output 7910 ml  Net -2673.72 ml     Physical Exam: General:  Ill appearing. Intubated. Awake but won't follow commands HEENT: normal + ETT. Scleral edema improving Neck: supple. RIJ ECMO cannula. L [place]  trialysis cath Cor: PMI nondisplaced. Regular rate & rhythm. No rubs, gallops or murmurs. Lungs: minimal air movement Abdomen: obese soft, nontender, nondistended. No hepatosplenomegaly. No bruits or masses. Good bowel sounds. Extremities: no cyanosis, clubbing, rash, 1+ edema  Ischemic changes both hands Neuro: awake. Not following commands.    Telemetry: Sinus 90-110 Personally reviewed   Labs: Basic Metabolic Panel: Recent Labs  Lab 01/29/20 0451 01/29/20 4097 01/30/20 0324 01/30/20 0538 01/31/20 0300 01/31/20 0448 01/31/20 1541 01/31/20 1703 02/01/20 0226 02/01/20 3532 02/01/20 1700 02/01/20 1856 02/02/20 0330 02/02/20 0436 02/02/20 0624  NA 147*   < > 141   < > 134*   < > 135   < > 137   < > 136 138 135 137 138  K 4.8   < > 4.7   < > 3.9   < > 4.0   < > 4.3   < > 4.4 4.3 4.2 4.3 4.5  CL 109   < > 103   < > 101  --  101  --  101  --  102  --  100  --   --   CO2 25   < > 25   < > 22  --  24  --  23  --  25  --  24  --   --   GLUCOSE 211*   < >  164*   < > 278*  --  218*  --  207*  --  194*  --  182*  --   --   BUN 151*   < > 95*   < > 73*  --  59*  --  50*  --  44*  --  42*  --   --   CREATININE 1.45*   < > 1.31*   < > 1.26*  --  1.16*  --  1.05*  --  1.08*  --  1.10*  --   --   CALCIUM 8.6*   < > 8.0*   < > 7.1*   < > 7.3*   < > 7.3*  --  7.5*  --  7.7*  --   --   MG 3.3*  --  2.9*  --  2.6*  --   --   --  2.7*  --   --   --  2.8*  --   --   PHOS 5.0*  --  6.0*  --  6.1*  --   --   --  4.4  --   --   --  3.7  --   --    < > = values in this interval not displayed.    Liver Function Tests: Recent Labs  Lab 01/29/20 0451 01/30/20 0324 01/31/20 0300 02/01/20 0226 02/02/20 0330  AST 65* 123* 206* 245* 195*  ALT 59* 69* 88* 118* 134*  ALKPHOS 137* 139* 155* 204* 259*  BILITOT 1.5* 1.8* 1.6* 1.9* 1.9*  PROT 5.4* 5.3* 4.9* 4.9* 5.4*  ALBUMIN 3.2* 2.9* 2.4* 2.3* 2.4*   No results for input(s): LIPASE, AMYLASE in the last 168 hours. No results for input(s):  AMMONIA in the last 168 hours.  CBC: Recent Labs  Lab 01/31/20 0300 01/31/20 0448 01/31/20 1541 01/31/20 1703 02/01/20 0226 02/01/20 0635 02/01/20 1700 02/01/20 1856 02/02/20 0330 02/02/20 0436 02/02/20 0624  WBC 11.8*  --  17.7*  --  19.0*  --  20.9*  --  20.2*  --   --   HGB 8.2*   < > 8.8*   < > 8.1*   < > 8.1* 8.8* 7.7* 7.8* 8.2*  HCT 25.9*   < > 26.6*   < > 25.4*   < > 25.5* 26.0* 24.4* 23.0* 24.0*  MCV 98.1  --  99.3  --  99.2  --  100.8*  --  103.4*  --   --   PLT 32*  --  41*  --  46*  --  53*  --  61*  --   --    < > = values in this interval not displayed.    Cardiac Enzymes: No results for input(s): CKTOTAL, CKMB, CKMBINDEX, TROPONINI in the last 168 hours.  BNP: BNP (last 3 results) No results for input(s): BNP in the last 8760 hours.  ProBNP (last 3 results) No results for input(s): PROBNP in the last 8760 hours.    Other results:  Imaging: DG CHEST PORT 1 VIEW  Result Date: 02/02/2020 CLINICAL DATA:  ETT, respiratory failure, on ECMO EXAM: PORTABLE CHEST 1 VIEW COMPARISON:  02/01/2020 FINDINGS: Diffuse opacification of the lungs bilaterally with associated ECMO catheter. Endotracheal tube terminates 4.5 cm above the carina. Left IJ venous catheter terminates in the mid SVC. Enteric tube courses into the stomach. Cardiomediastinal silhouette is obscured. IMPRESSION: Diffuse opacification of the lungs bilaterally with associated ECMO catheter. Endotracheal tube terminates 4.5 cm above  the carina. Additional support apparatus as above. Electronically Signed   By: Julian Hy M.D.   On: 02/02/2020 07:43   DG CHEST PORT 1 VIEW  Result Date: 02/01/2020 CLINICAL DATA:  ECMO EXAM: PORTABLE CHEST 1 VIEW COMPARISON:  January 31, 2020 FINDINGS: The cardiomediastinal silhouette is obscured.ETT tip terminates 2.2 cm above the carina. The enteric tube courses through the chest to the abdomen beyond the field-of-view. Esophageal temperature probe. ECMO cannulation  support apparatus. LEFT IJ CVC tip terminates over the confluence of LEFT brachiocephalic vein and the SVC. LEFT subclavian approach CVC tip terminates over the region of the LEFT subclavian vessels. RIGHT upper extremity PICC tip is obscured by overlying ECMO cannulation support apparatus but is favored to terminate over the region of the RIGHT subclavian vessels. No pneumothorax. Near complete opacification of bilateral lungs with scattered air bronchograms bilaterally. Visualized abdomen is unremarkable. Multilevel degenerative changes of the thoracic spine. IMPRESSION: 1.  Support apparatus as described above. 2. Near complete opacification of bilateral lungs with scattered air bronchograms bilaterally. Electronically Signed   By: Valentino Saxon MD   On: 02/01/2020 08:11     Medications:     Scheduled Medications: . sodium chloride   Intravenous Once  . sodium chloride   Intravenous Once  . vitamin C  500 mg Per Tube Daily  . B-complex with vitamin C  1 tablet Oral Daily  . chlorhexidine gluconate (MEDLINE KIT)  15 mL Mouth Rinse BID  . Chlorhexidine Gluconate Cloth  6 each Topical Daily  . clonazePAM  1 mg Per Tube Q6H  . docusate  100 mg Per Tube BID  . feeding supplement (PROSource TF)  45 mL Per Tube TID  . fentaNYL (SUBLIMAZE) injection  50 mcg Intravenous Once  . linagliptin  5 mg Per Tube Daily  . mouth rinse  15 mL Mouth Rinse 10 times per day  . oxyCODONE  10 mg Per Tube Q6H  . pantoprazole (PROTONIX) IV  40 mg Intravenous Daily  . polyethylene glycol  17 g Per Tube Daily  . sennosides  5 mL Per Tube BID  . zinc sulfate  220 mg Per Tube Daily    Infusions: . sodium chloride 500 mL (02/01/20 1958)  . albumin human 12.5 g (01/28/20 1229)  . albumin human    . amiodarone 30 mg/hr (02/02/20 0700)  . anidulafungin    . dexmedetomidine (PRECEDEX) IV infusion 1 mcg/kg/hr (02/02/20 0700)  . feeding supplement (PIVOT 1.5 CAL) 75 mL/hr at 02/02/20 0700  . heparin 400  Units/hr (02/02/20 0700)  . HYDROmorphone 3 mg/hr (02/02/20 0700)  . insulin 18 Units/hr (02/02/20 0717)  . milrinone 0.125 mcg/kg/min (02/02/20 0700)  . norepinephrine (LEVOPHED) Adult infusion 16 mcg/min (02/02/20 0700)  . piperacillin-tazobactam (ZOSYN)  IV Stopped (02/02/20 6967)  . prismasol BGK 2/2.5 replacement solution 500 mL/hr at 02/02/20 0115  . prismasol BGK 2/2.5 replacement solution 300 mL/hr at 02/02/20 0113  . prismasol BGK 4/2.5 2,000 mL/hr at 02/02/20 8938  . vasopressin 0.04 Units/min (02/02/20 0700)    PRN Medications: sodium chloride, acetaminophen (TYLENOL) oral liquid 160 mg/5 mL, albumin human, albuterol, dextrose, fentaNYL, fentaNYL (SUBLIMAZE) injection, guaiFENesin-dextromethorphan, heparin, hydrALAZINE, HYDROmorphone, hydrOXYzine, ondansetron **OR** ondansetron (ZOFRAN) IV, sodium chloride   Assessment/Plan:   1. Acute hypoxic/hypercapneic respiratory failure in setting of severe COVID PNA/ARDS -> VV ECMO - admit 8/2 - intubation 8/3 - has received actmera (compelted 8/2), remdesivir (completed 8/6) and steroids - failed full vent support with proning/paralytic -> Cannulated for  VV ECMO on 8/9 - Extubated 8/16. Reintubated 8/24 - CXR unchanged -> diffuse infiltrates. Personally reviewed - Circuit changed 8/24 due to concern for hemolysis and worsening oxygenation. Oxygenation improved but still marginal. LDH remains high but PLTs and fibrinogen trending up steadily.  - CVVHD started 8/22 for volume removal and uremia. Have been able to pull well over last 2 days. Remains anuric. Renal following. Keep mildly negative to even.  - Little change in multi-system organ failure (brain, lungs, kidneys, liver, vascular injury). Have d/w family and will continue full support. Prognosis for meaningful remains quite small. Palliative Care following  2. Enterococcus sepsis - back on high-dose zosyn per ID - Continue VP and NE for BP support - WBC climbing. Bcx  redrawn. - Eraxis added on 8/26 with yeast in BAL 8/24  3. Intracranial hemorrhage - ? Septic emboli - repeat head CT on 8/14 with stable bleeds but increased edema - neurology has seen. Suspect significant long-term injury sustained. Will follow commands. Appears to have dense LUE weakness and possibly LLE - repeat head CT stable 8/16 - Now back on low-dose heparin to protect circuit from further clotting. Heparin level undetectable. Exam stable - Consider TEE at some point to further evalaute. Can consider now that she is re-intubated but won't change management currently  4. Thrombocytopenia - PLTs up to 61K today after circuit change - Continue to follow  5. Morbid obesity - Body mass index is Body mass index is 49.53 kg/m.  6. Poorly controlled DM2 - HgBA1c 10.7 - CBGs remain elevated - On IV insulin adjust as needed  6. Hypernatremia - Na 135 Improving with HD.   7. Lactic acidosis -  Lactate 1.3 -> 1.9 today  8. PAF - intermittent episodes. Last 8/26 - back in NSR on IV amio this am. Continue   9. AKI/azotemia - CVVHD started on 8/22 - We were able to keep negative overnight - Discussed with Renal this am. Will continue. BUN down to 42  10. Ischemic R fingers - ? Due to septic embolic versus vascular issue - VVS following.   11. Acute liver failure - LFTs slightly better. Bili stabl at 1.9 today - has underlying fatty liver - mix of conjugated/unconjugated   CRITICAL CARE Performed by: Glori Bickers  Total critical care time: 50 minutes  Critical care time was exclusive of separately billable procedures and treating other patients.  Critical care was necessary to treat or prevent imminent or life-threatening deterioration.  Critical care was time spent personally by me (independent of midlevel providers or residents) on the following activities: development of treatment plan with patient and/or surrogate as well as nursing, discussions with  consultants, evaluation of patient's response to treatment, examination of patient, obtaining history from patient or surrogate, ordering and performing treatments and interventions, ordering and review of laboratory studies, ordering and review of radiographic studies, pulse oximetry and re-evaluation of patient's condition.   Length of Stay: 25   Glori Bickers  MD 02/02/2020, 8:35 AM  Advanced Heart Failure Team Pager [phone] (M-F; 7a - 4p)  Please contact [facility] for night-coverage after hours (4p -7a ) and weekends on [email or web address]

## 2020-02-02 NOTE — Progress Notes (Signed)
Nutrition Follow Up  DOCUMENTATION CODES:   Morbid obesity  INTERVENTION:   Continue tube feeding:  -Pivot 1.5 @ 75 ml/hr (1800 ml) via Cortrak -45 ml Prosource TID  Provides: 2820 kcals, 202 grams protein, 1350 ml free water. Meets 100% needs.   Continue B complex with Vitamin C to account for losses with CRRT   NUTRITION DIAGNOSIS:   Increased nutrient needs related to acute illness (COVID-19 infection) as evidenced by estimated needs.  Ongoing  GOAL:   Patient will meet greater than or equal to 90% of their needs  Addressed via TF  MONITOR:   Vent status, TF tolerance, Labs, Weight trends  REASON FOR ASSESSMENT:   Ventilator, Consult Enteral/tube feeding initiation and management  ASSESSMENT:   54 y.o. female with medical history of type 2 DM. She presented to the ED at [facility] with SOB diarrhea x1 week, and cough. She was dx with COVID-19 one week prior to presentation to the ED.   8/9- tx from [facility], s/p VV ECMO cannulation 8/13- CT head with multiple areas of [place] 8/16- extubated, gastric Cortrak placed  8/22- start CRRT  Pt discussed during ICU rounds and with RN.   Requiring multiple pressors. On CRRT pulling 100 ml/hr, remains anuric. Plan trach once platelets improve. Worsening cyanosis in hands/feet. R hand high risk for losing digits. Tolerating tube feeding at goal. CBGs slightly improved with EndoTool but remain elevated: 202, 207, 188, 176, 182, 196.   Palliative met with family. Desires full aggressive care.   Admission weight: 133.6 kg Current weight: 130.9 kg (down 3 kg over the last three days)  Patient remains intubated on ventilator support MV: 0.8 L/min Temp (24hrs), Avg:96.9 F (36.1 C), Min:95.7 F (35.4 C), Max:98.1 F (36.7 C)   I/O: +302 ml since 8/13 UOP: 0 ml x 24 hrs  CRRT: 7,506 ml x 24 hrs Stool: 640 ml x 24 hrs   Drips: precedex, insulin, milrinone, levophed, vasopressin Medications: 500 mg Vit C, colace,  tradjenta, miralax, senokot, zinc sulfate Labs: Mg 2.8 (H) LFTs elevated   Diet Order:   Diet Order            Diet NPO time specified  Diet effective midnight                 EDUCATION NEEDS:   No education needs have been identified at this time  Skin:  Skin Assessment: Skin Integrity Issues: Skin Integrity Issues:: Other (Comment) Other: MASD- breast skin tears-face/chest/chin/buttocks  Last BM:  8/27 via rectal tube  Height:   Ht Readings from Last 1 Encounters:  01/19/2020 _0  (1.626 m)    Weight:   Wt Readings from Last 1 Encounters:  02/02/20 130.9 kg    Ideal Body Weight:  54.5 kg Adj Body Weight: 84.7 kg   BMI:  Body mass index is 49.53 kg/m.  Estimated Nutritional Needs:   Kcal:  6256-3893 kcal  Protein:  170-210 grams  Fluid:  >/= 2 L/day  Mariana Single RD, LDN Clinical Nutrition Pager listed in [place]

## 2020-02-02 NOTE — Progress Notes (Signed)
NAME:  Alisha Stephenson, MRN:  [id number], DOB:  1966-03-05, LOS: 5 ADMISSION DATE:  02/01/2020, CONSULTATION DATE:  8/3 REFERRING MD:  Alfredia Ferguson, CHIEF COMPLAINT:  Dyspnea   Brief History   54 y/o female admitted on 8/2 with severe acute respiratory failure with hypoxemia due to COVID 19 pneumonia.  She developed symptoms 1 week prior to admission.  Past Medical History  DM2 Diverticulitis Gallstones Ovarian cyst NAFLD Asthma  Significant Hospital Events   8/2 admit 8/3 ICU transfer, intubated 8/4 prone, paralyze 8/9 significant desaturations today 8/9 VV ECMO cannulation 8/13 ICH  Consults:  PCCM ECMO team   Procedures:  8/3 ETT >  8/3 PICC >  8/9 LIJ MML 8/9 RIJ Crescent 22F   Significant Diagnostic Tests:  7/31 CT head > NAICP 7/31 MRI/MRA brain > no acute changes, possibly small aneurysm ACOM 8/13 CT head> multiple areas of ICH  Micro Data:  8/2 blood > NG 8/2 SARS COV 2 > positive 8/4 resp > negative 8/4 urine >  8/12 blood > E. faecalis (pan-sensitive) 8/12 resp: staph aureus> MSSA 8/25 blood>> 10K yeast 8/24 BAL> yeast  Antimicrobials:  8/2 remdesivir > 8/6 8/2 actemra  8/2 solumedrol >   8/3 ceftriaxone >  8/5 8/3 azithro >  8/5  8/12 meropenem>8/16 8/12 vanc>8/16  8/13 zosyn >> 8/20 , restart 8/23 Vanc 8/19>> 8/22 Fluconazole 8/19>> 8/22 Amp & ceftriaxone  8/22-8/23   Interim history/subjective:  No bleeding overnight.  Worse cyanosis in hands and feet. Husband and daughter at bedside today. She has at times endorsed discomfort.  Objective   Blood pressure (!) 153/57, pulse 91, temperature (!) 96.8 F (36 C), resp. rate 17, height _0  (1.626 m), weight 130.9 kg, SpO2 90 %. CVP:  [17 mmHg-24 mmHg] 21 mmHg  Vent Mode: PCV FiO2 (%):  [60 %] 60 % Set Rate:  [15 bmp] 15 bmp Vt Set:  [350 mL] 350 mL PEEP:  [12 cmH20] 12 cmH20 Plateau Pressure:  [20 cmH20-28 cmH20] 20 cmH20   Intake/Output Summary (Last 24 hours) at 02/02/2020 0710 Last  data filed at 02/02/2020 0700 Gross per 24 hour  Intake 5494.15 ml  Output 8146 ml  Net -2651.85 ml   Filed Weights   01/31/20 0630 02/01/20 0500 02/02/20 0500  Weight: 134 kg 133.5 kg 130.9 kg    Examination:  GEN: Critically ill-appearing woman lying in bed intubated, lightly sedated, on ECMO HEENT: [place]/AT, scleral edema with mild icterus CV: Regular rate and rhythm, no murmurs PULM: breathing synchronously with the vent, not breathing above the set rate, no breath sounds. GI: Obese, soft, nontender EXT: Ongoing edema in hands, feet.  Well demarcated cyanosis in digits on right hand, bruising over her knuckles and hand on the left, but not in a circumferential pattern as the right.  Wound on distal right toe associated with ingrown toenail-not erythematous.  Mild cyanosis of feet NEURO: RASS -2, tracking, nodding to answer questions sometimes.  Very weak globally.  Not moving left upper extremity.   Labs reviewed-fibrinogen and platelets slightly improved, LFTs not improving CXR 8/25 personally reviewed: Few air bronchograms in lower lobes, overall densely consolidated lungs.  ET tube 4 centimeters above the carina.  Left subclavian dialysis, right IJ ECMO cannula, L IJ CVC in place   Resolved Hospital Problem list     Assessment & Plan:  Acute hypoxic and AoC hypercapneic respiratory failure; likely underly OHS (baseline bicarb 27-30) ARDS due to COVID 19 pneumonia   VV ECMO  cannulation on 8/9 for refractory hypoxemia MSSA pneumonia Possibly fungal infection vs contamination. Clinically consistent with infection causing sepsis. -Continue VV ECMO. Circuit change on 8/24, cannula pulled back.  Continue all aggressive care measures.  Discussed with family at bedside today for concern of progressive multiorgan failure over the past few weeks despite maximal therapy for ARDS. -Con't MV. Trach when platelets improve, likely next week. -low dose heparin to prevent circuit  clotting  Enterococcal bacteremia-recent blood cultures cleared Concern for possible embolic phenomenon- fingers and possibly CVAs were embolic  -Appreciate ID's assistance. Con't Eraxis, Zosyn.  -Appreciate vascular surgery's input. Will have to allow digits to demarcate. High risk for losing digits, which is especially concerning on her right/on that hand -Urine culture pending . Foley removed due to lack of UOP.  Elevated LDH, hypo-fibrinogenemia and thrombocytopenia; most likely due to sepsis and consumption from circuit.  Slowly improving. -Continue to monitor platelet count  ICH, multifocal. L-sided weakness.  Encephalopathy, improved -Continue CRRT - limit sedation as much as possible - reorientation, limit deliriogenic meds as much as possible. - PT & OT when able -Family visitation  Acute renal failure, severe uremia.  Anuric. -Continue CRRT; goal net negative. Have been limited by hypotension. -Strict I's/O  -Renally dose medications and avoid nephrotoxic meds.  Shock related to sedation vs sepsis and RV stunning- milrinone and PRN norepinephrine Septic shock- Enterococcus bacteremia Increased WBC, hypotension and question sepsis 8/19, question of septic emboli to explain head bleeds Concerning for fungal pulmonary infection versus colonization -ID following- appreciate their assistance.  Continue Zosyn and Eraxis -recultured on 8/25; BAL culture from 8/24 pending  -NE, vasopressin to maintain MAP >65--stable doses  Hyperglycemia > not controlled at all with [place] insulin despite aggressive upward titration.  Assume poor absorption, potentially due to subcutaneous edema.   Remains elevated on IV. -To remain on insulin infusion -Goal BG 140-180 while admitted to the ICU -Continue linagliptin to decrease insulin requirements  Constipation, resolved  Hypernatremia; resolved -ok to let sodium normalize -Not requiring free water  Acute anemia due to critical  illness Acute thrombocytopenia- refractory to platelet transfusion, not better after circuit change Coagulopathy- concern for underlying liver dysfunction -transfuse for Hb <8 -con't to monitor   Hyperbilirubinemia (increased direct and indirect), elevated transaminases coagulopathy Concern for progressive MOF over her critical illness-relatively stable but persistent -Appreciate palliative care teams input throughout her stay.  8/25 to update multiple relatives on her course and prognosis.   Best practice:  Diet: tube feeding Pain/Anxiety/Delirium protocol (if indicated): as above VAP protocol (if indicated): yes DVT prophylaxis: SCDs , very low dose heparin GI prophylaxis: pantoprazole Glucose control:  insulin infusion  Mobility: bed rest Code Status: limited Family Communication: Husband and daughter updated in her room Disposition: ICU  This patient is critically ill with multiple organ system failure which requires frequent high complexity decision making, assessment, support, evaluation, and titration of therapies. This was completed through the application of advanced monitoring technologies and extensive interpretation of multiple databases. During this encounter critical care time was devoted to patient care services described in this note for 55 minutes.   Julian Hy, DO 02/02/20 11:55 AM [facility]

## 2020-02-02 NOTE — CV Procedure (Signed)
ECMO NOTE:  Indication: Respiratory failure due to COVID PNA  Initial cannulation date: 01/24/2020  ECMO type: VV ECMO (Centrimag with oximizer)  Dual lumen Inflow/return cannula: 32 FR RIJ Crescent placed 01/08/2020  ECMO events:  - Initial cannulation 01/28/2020 - Cannula repositioned (pulled back ) 01/17/20 - Off bival 8/13 due to Intercourse - circuit change 8/24 and cannula pulled back  Daily data:  Flow5.0 RPM 4200 Sweep 13  Labs:  ABG    Component Value Date/Time   PHART 7.350 02/02/2020 0624   PCO2ART 46.8 02/02/2020 0624   PO2ART 50 (L) 02/02/2020 0624   HCO3 26.1 02/02/2020 0624   TCO2 28 02/02/2020 0624   ACIDBASEDEF 1.0 01/31/2020 1703   O2SAT 85.0 02/02/2020 0624     BG remain high despite insulin drip-as high as low 200s BUN/Cr improved on CRRT LDH 547> 655> 917> 1141> 1217> 1250> 1288 Fibrinogen  90 > 176> 316 > 521 INR 2.5>>1.9>>2.0> 2.0> 1.6> 1.4> 1.3 PTT 43 HgB 8.5>>8.2>>8.7> 8.8> 7.8> 8.1>7.7 Plts 34>>37>>35> 30> 29> 31>30> 32> 46> 61 WBC 13>>11>>11> 9.9> 7.9> 9.3> 11.3> 11.8> 19 > 20.2 LA 1.9  Assessment: -Severe COVID ARDS s/p VV ECMO -Enterococcal bacteremia> cleared -MSSA pneumonia; repeat cultures pending- growing yeast -Thrombocytopenia, refractory- combination of hemolysis, bone marrow stunning, and sepsis effect, possibly contribution of chronic liver dysfunction  - Intracerebral hemorrhage with question of septic emboli; on very low dose AC with new circuit -Ischemic digits both hands, right worse than left - Muscular deconditioning - Acute kidney failure requiring CVVHD - progressive MOF  Plan: -Continue VV ECMO support. No significant change in thrombocytopenia post-circuit change.  -Con't MV ultra-lung protective. Pplat 24. -Low-dose heparin for circuit change and to prevent clotting of new circuit -Continue CVVHD -Continue insulin gtt-does not absorb subcu insulin -zosyn per ID, treating empirically for endocarditis without  TEE.  Continue empirically treating for endocarditis given concern for vascular emboli. -Limited code, daily updates with family.  -Off covid isolation   Multidisciplinary rounds with cardiology, critical care medicine, cardiothoracic surgery, coordinator, PharmDs, RTs, and all nurses present.  Julian Hy, DO 02/02/20 12:02 PM [facility]

## 2020-02-02 NOTE — Progress Notes (Signed)
[facility] Progress Note Patient Name: Alisha Stephenson DOB: 02-17-1966 MRN: [id number]   Date of Service  02/02/2020  HPI/Events of Note  Anemia - Hgb = 7.7. Goal Hgb per Dr. [name] = 8.0.  eICU Interventions  Will transfuse 1 unit PRBC now.      Intervention Category Major Interventions: Other:  [name] Cornelia Copa 02/02/2020, 6:08 AM

## 2020-02-02 NOTE — Progress Notes (Signed)
[facility] NEPHROLOGY PROGRESS NOTE  Assessment/ Plan: Pt is a 54 y.o. yo female  With PMH of DM2, obesity, NAFLD who present w/ acute hypoxic respiratory failure due to Covid.  Hospitalization complicated by severe shock, refractory hypoxia requiring ECMO, multiple infections, intracranial hemorrhage, encephalopathy, AKI.  # Anuric AKI due to shock, sepsis causing acute tubular necrosis: Started CRRT on 8/22 for uremia and volume overload.  She has ECMO. No clinical improvement.  She has fluid overload and gained significant amount of weight. Able to take of fluid and maintaining negative fluid balance.  Blood pressure now acceptable on multiple pressors and inotropes. Continue CRRT, prognosis grim.  #Shock due to sepsis, Covid infection, Enterococcus bacteremia: On pressors, antibiotics per primary team.  #Acute hypoxic and hypercapnic respiratory failure, ARDS due to COVID-19 pneumonia: Currently on ECMO for refractory hypoxemia, MSSA pneumonia.  On broad-spectrum antibiotics.   #Hypernatremia, hypervolemia: relative free water deficit.  Improved with CRRT.  #Acute toxic metabolic encephalopathy: Multifactorial with uremia contributing.  Treatment with CRRT as above.  #Anemia, thrombocytopenia: With hemolysis likely and acute illness contributing.  Transfusions per primary team.  Hematology is following.  #Intracranial hemorrhage: With some left-sided weakness preceded by primary team.  Avoiding heparin products as able.  #GOC: Multiorgan failure.  Had family meeting on 8/25, continue full scope of treatment.  Long-term prognosis poor.  Subjective: Seen and examined.  No urine output.  Able to take off around 100 cc an hour net negative.  Remains on ECMO and multiple pressors. Objective Vital signs in last 24 hours: Vitals:   02/02/20 0700 02/02/20 0800 02/02/20 0801 02/02/20 0900  BP:      Pulse: 91 (!) 104  (!) 101  Resp:      Temp: (!) 96.8 F (36 C) (!) 97  F (36.1 C)  (!) 97.2 F (36.2 C)  TempSrc:      SpO2: 90% (!) 87% 91% (!) 87%  Weight:      Height:       Weight change: -2.6 kg  Intake/Output Summary (Last 24 hours) at 02/02/2020 1011 Last data filed at 02/02/2020 0900 Gross per 24 hour  Intake 5344.53 ml  Output 7862 ml  Net -2517.47 ml       Labs: Basic Metabolic Panel: Recent Labs  Lab 01/31/20 0300 01/31/20 0448 02/01/20 0226 02/01/20 0635 02/01/20 1700 02/01/20 1856 02/02/20 0330 02/02/20 0436 02/02/20 0624  NA 134*   < > 137   < > 136   < > 135 137 138  K 3.9   < > 4.3   < > 4.4   < > 4.2 4.3 4.5  CL 101   < > 101  --  102  --  100  --   --   CO2 22   < > 23  --  25  --  24  --   --   GLUCOSE 278*   < > 207*  --  194*  --  182*  --   --   BUN 73*   < > 50*  --  44*  --  42*  --   --   CREATININE 1.26*   < > 1.05*  --  1.08*  --  1.10*  --   --   CALCIUM 7.1*   < > 7.3*  --  7.5*  --  7.7*  --   --   PHOS 6.1*  --  4.4  --   --   --  3.7  --   --    < > = values in this interval not displayed.   Liver Function Tests: Recent Labs  Lab 01/31/20 0300 02/01/20 0226 02/02/20 0330  AST 206* 245* 195*  ALT 88* 118* 134*  ALKPHOS 155* 204* 259*  BILITOT 1.6* 1.9* 1.9*  PROT 4.9* 4.9* 5.4*  ALBUMIN 2.4* 2.3* 2.4*   No results for input(s): LIPASE, AMYLASE in the last 168 hours. No results for input(s): AMMONIA in the last 168 hours. CBC: Recent Labs  Lab 01/31/20 0300 01/31/20 0448 01/31/20 1541 01/31/20 1703 02/01/20 0226 02/01/20 0635 02/01/20 1700 02/01/20 1856 02/02/20 0330 02/02/20 0436 02/02/20 0624  WBC 11.8*   < > 17.7*   < > 19.0*  --  20.9*  --  20.2*  --   --   HGB 8.2*   < > 8.8*   < > 8.1*   < > 8.1*   < > 7.7* 7.8* 8.2*  HCT 25.9*   < > 26.6*   < > 25.4*   < > 25.5*   < > 24.4* 23.0* 24.0*  MCV 98.1  --  99.3  --  99.2  --  100.8*  --  103.4*  --   --   PLT 32*   < > 41*   < > 46*  --  53*  --  61*  --   --    < > = values in this interval not displayed.   Cardiac  Enzymes: No results for input(s): CKTOTAL, CKMB, CKMBINDEX, TROPONINI in the last 168 hours. CBG: Recent Labs  Lab 02/02/20 0221 02/02/20 0320 02/02/20 0434 02/02/20 0552 02/02/20 0645  GLUCAP 207* 170* 161* 172* 203*    Iron Studies: No results for input(s): IRON, TIBC, TRANSFERRIN, FERRITIN in the last 72 hours. Studies/Results: DG CHEST PORT 1 VIEW  Result Date: 02/02/2020 CLINICAL DATA:  ETT, respiratory failure, on ECMO EXAM: PORTABLE CHEST 1 VIEW COMPARISON:  02/01/2020 FINDINGS: Diffuse opacification of the lungs bilaterally with associated ECMO catheter. Endotracheal tube terminates 4.5 cm above the carina. Left IJ venous catheter terminates in the mid SVC. Enteric tube courses into the stomach. Cardiomediastinal silhouette is obscured. IMPRESSION: Diffuse opacification of the lungs bilaterally with associated ECMO catheter. Endotracheal tube terminates 4.5 cm above the carina. Additional support apparatus as above. Electronically Signed   By: Julian Hy M.D.   On: 02/02/2020 07:43   DG CHEST PORT 1 VIEW  Result Date: 02/01/2020 CLINICAL DATA:  ECMO EXAM: PORTABLE CHEST 1 VIEW COMPARISON:  January 31, 2020 FINDINGS: The cardiomediastinal silhouette is obscured.ETT tip terminates 2.2 cm above the carina. The enteric tube courses through the chest to the abdomen beyond the field-of-view. Esophageal temperature probe. ECMO cannulation support apparatus. LEFT IJ CVC tip terminates over the confluence of LEFT brachiocephalic vein and the SVC. LEFT subclavian approach CVC tip terminates over the region of the LEFT subclavian vessels. RIGHT upper extremity PICC tip is obscured by overlying ECMO cannulation support apparatus but is favored to terminate over the region of the RIGHT subclavian vessels. No pneumothorax. Near complete opacification of bilateral lungs with scattered air bronchograms bilaterally. Visualized abdomen is unremarkable. Multilevel degenerative changes of the thoracic  spine. IMPRESSION: 1.  Support apparatus as described above. 2. Near complete opacification of bilateral lungs with scattered air bronchograms bilaterally. Electronically Signed   By: Valentino Saxon MD   On: 02/01/2020 08:11    Medications: Infusions: . sodium chloride 500 mL (02/01/20 1958)  . albumin  human 12.5 g (01/28/20 1229)  . albumin human    . amiodarone 30 mg/hr (02/02/20 0900)  . anidulafungin    . dexmedetomidine (PRECEDEX) IV infusion 1 mcg/kg/hr (02/02/20 0946)  . feeding supplement (PIVOT 1.5 CAL) 75 mL/hr at 02/02/20 0700  . heparin 400 Units/hr (02/02/20 0900)  . HYDROmorphone 3 mg/hr (02/02/20 0948)  . insulin 15 mL/hr at 02/02/20 0900  . milrinone 0.125 mcg/kg/min (02/02/20 0900)  . norepinephrine (LEVOPHED) Adult infusion 16 mcg/min (02/02/20 0900)  . piperacillin-tazobactam (ZOSYN)  IV Stopped (02/02/20 8921)  . prismasol BGK 2/2.5 replacement solution 500 mL/hr at 02/02/20 0115  . prismasol BGK 2/2.5 replacement solution 300 mL/hr at 02/02/20 0113  . prismasol BGK 4/2.5 2,000 mL/hr at 02/02/20 1941  . vasopressin 0.04 Units/min (02/02/20 0949)    Scheduled Medications: . sodium chloride   Intravenous Once  . sodium chloride   Intravenous Once  . vitamin C  500 mg Per Tube Daily  . B-complex with vitamin C  1 tablet Oral Daily  . chlorhexidine gluconate (MEDLINE KIT)  15 mL Mouth Rinse BID  . Chlorhexidine Gluconate Cloth  6 each Topical Daily  . clonazePAM  1 mg Per Tube Q6H  . docusate  100 mg Per Tube BID  . feeding supplement (PROSource TF)  45 mL Per Tube TID  . fentaNYL (SUBLIMAZE) injection  50 mcg Intravenous Once  . linagliptin  5 mg Per Tube Daily  . mouth rinse  15 mL Mouth Rinse 10 times per day  . oxyCODONE  10 mg Per Tube Q6H  . pantoprazole (PROTONIX) IV  40 mg Intravenous Daily  . polyethylene glycol  17 g Per Tube Daily  . sennosides  5 mL Per Tube BID  . zinc sulfate  220 mg Per Tube Daily    have reviewed scheduled and prn  medications.  Physical Exam: General: Critically ill female, sedated, has ECMO running.  Not much improvement Heart:RRR, s1s2 nl Lungs: Coarse breath sound bilateral Abdomen:soft, non-distended Extremities: Anasarca and all extremities has edema. Dialysis Access: Temporary HD catheter  Jakorey Mcconathy Tanna Furry 02/02/2020,10:11 AM  LOS: 25 days  Pager: [phone]

## 2020-02-02 NOTE — Progress Notes (Signed)
Assisted tele visit to patient with family member.  Alisha Stephenson eLink RN  

## 2020-02-03 ENCOUNTER — Inpatient Hospital Stay (HOSPITAL_COMMUNITY): Payer: PRIVATE HEALTH INSURANCE

## 2020-02-03 LAB — POCT I-STAT 7, (LYTES, BLD GAS, ICA,H+H)
Acid-Base Excess: 0 mmol/L (ref 0.0–2.0)
Acid-Base Excess: 1 mmol/L (ref 0.0–2.0)
Acid-Base Excess: 2 mmol/L (ref 0.0–2.0)
Acid-Base Excess: 3 mmol/L — ABNORMAL HIGH (ref 0.0–2.0)
Acid-Base Excess: 3 mmol/L — ABNORMAL HIGH (ref 0.0–2.0)
Bicarbonate: 25.5 mmol/L (ref 20.0–28.0)
Bicarbonate: 25.4 mmol/L (ref 20.0–28.0)
Bicarbonate: 26.7 mmol/L (ref 20.0–28.0)
Bicarbonate: 27.9 mmol/L (ref 20.0–28.0)
Bicarbonate: 28 mmol/L (ref 20.0–28.0)
Calcium, Ion: 1.11 mmol/L — ABNORMAL LOW (ref 1.15–1.40)
Calcium, Ion: 1.1 mmol/L — ABNORMAL LOW (ref 1.15–1.40)
Calcium, Ion: 1.07 mmol/L — ABNORMAL LOW (ref 1.15–1.40)
Calcium, Ion: 1.04 mmol/L — ABNORMAL LOW (ref 1.15–1.40)
Calcium, Ion: 1.07 mmol/L — ABNORMAL LOW (ref 1.15–1.40)
HCT: 25 % — ABNORMAL LOW (ref 36.0–46.0)
HCT: 25 % — ABNORMAL LOW (ref 36.0–46.0)
HCT: 25 % — ABNORMAL LOW (ref 36.0–46.0)
HCT: 26 % — ABNORMAL LOW (ref 36.0–46.0)
HCT: 25 % — ABNORMAL LOW (ref 36.0–46.0)
Hemoglobin: 8.5 g/dL — ABNORMAL LOW (ref 12.0–15.0)
Hemoglobin: 8.5 g/dL — ABNORMAL LOW (ref 12.0–15.0)
Hemoglobin: 8.5 g/dL — ABNORMAL LOW (ref 12.0–15.0)
Hemoglobin: 8.8 g/dL — ABNORMAL LOW (ref 12.0–15.0)
Hemoglobin: 8.5 g/dL — ABNORMAL LOW (ref 12.0–15.0)
O2 Saturation: 90 %
O2 Saturation: 93 %
O2 Saturation: 94 %
O2 Saturation: 90 %
O2 Saturation: 93 %
Patient temperature: 35.8
Patient temperature: 37.1
Patient temperature: 37.1
Patient temperature: 37.1
Patient temperature: 37.1
Potassium: 3.8 mmol/L (ref 3.5–5.1)
Potassium: 4.2 mmol/L (ref 3.5–5.1)
Potassium: 4.7 mmol/L (ref 3.5–5.1)
Potassium: 6.5 mmol/L (ref 3.5–5.1)
Potassium: 5.3 mmol/L — ABNORMAL HIGH (ref 3.5–5.1)
Sodium: 139 mmol/L (ref 135–145)
Sodium: 140 mmol/L (ref 135–145)
Sodium: 139 mmol/L (ref 135–145)
Sodium: 136 mmol/L (ref 135–145)
Sodium: 138 mmol/L (ref 135–145)
TCO2: 27 mmol/L (ref 22–32)
TCO2: 27 mmol/L (ref 22–32)
TCO2: 28 mmol/L (ref 22–32)
TCO2: 29 mmol/L (ref 22–32)
TCO2: 29 mmol/L (ref 22–32)
pCO2 arterial: 41.4 mmHg (ref 32.0–48.0)
pCO2 arterial: 39.8 mmHg (ref 32.0–48.0)
pCO2 arterial: 40 mmHg (ref 32.0–48.0)
pCO2 arterial: 42.1 mmHg (ref 32.0–48.0)
pCO2 arterial: 42.5 mmHg (ref 32.0–48.0)
pH, Arterial: 7.392 (ref 7.350–7.450)
pH, Arterial: 7.413 (ref 7.350–7.450)
pH, Arterial: 7.432 (ref 7.350–7.450)
pH, Arterial: 7.429 (ref 7.350–7.450)
pH, Arterial: 7.426 (ref 7.350–7.450)
pO2, Arterial: 56 mmHg — ABNORMAL LOW (ref 83.0–108.0)
pO2, Arterial: 65 mmHg — ABNORMAL LOW (ref 83.0–108.0)
pO2, Arterial: 68 mmHg — ABNORMAL LOW (ref 83.0–108.0)
pO2, Arterial: 57 mmHg — ABNORMAL LOW (ref 83.0–108.0)
pO2, Arterial: 67 mmHg — ABNORMAL LOW (ref 83.0–108.0)

## 2020-02-03 LAB — BASIC METABOLIC PANEL
Anion gap: 11 (ref 5–15)
Anion gap: 12 (ref 5–15)
BUN: 47 mg/dL — ABNORMAL HIGH (ref 6–20)
BUN: 45 mg/dL — ABNORMAL HIGH (ref 6–20)
CO2: 24 mmol/L (ref 22–32)
CO2: 23 mmol/L (ref 22–32)
Calcium: 7.8 mg/dL — ABNORMAL LOW (ref 8.9–10.3)
Calcium: 8 mg/dL — ABNORMAL LOW (ref 8.9–10.3)
Chloride: 102 mmol/L (ref 98–111)
Chloride: 101 mmol/L (ref 98–111)
Creatinine, Ser: 1.15 mg/dL — ABNORMAL HIGH (ref 0.44–1.00)
Creatinine, Ser: 1.06 mg/dL — ABNORMAL HIGH (ref 0.44–1.00)
GFR calc Af Amer: >60 mL/min (ref >60)
GFR calc Af Amer: >60 mL/min (ref >60)
GFR calc non Af Amer: 54 mL/min — ABNORMAL LOW (ref >60)
GFR calc non Af Amer: 59 mL/min — ABNORMAL LOW (ref >60)
Glucose, Bld: 217 mg/dL — ABNORMAL HIGH (ref 70–99)
Glucose, Bld: 177 mg/dL — ABNORMAL HIGH (ref 70–99)
Potassium: 3.6 mmol/L (ref 3.5–5.1)
Potassium: 5.9 mmol/L — ABNORMAL HIGH (ref 3.5–5.1)
Sodium: 137 mmol/L (ref 135–145)
Sodium: 136 mmol/L (ref 135–145)

## 2020-02-03 LAB — GLUCOSE, CAPILLARY
Glucose-Capillary: 135 mg/dL — ABNORMAL HIGH (ref 70–99)
Glucose-Capillary: 148 mg/dL — ABNORMAL HIGH (ref 70–99)
Glucose-Capillary: 152 mg/dL — ABNORMAL HIGH (ref 70–99)
Glucose-Capillary: 154 mg/dL — ABNORMAL HIGH (ref 70–99)
Glucose-Capillary: 155 mg/dL — ABNORMAL HIGH (ref 70–99)
Glucose-Capillary: 155 mg/dL — ABNORMAL HIGH (ref 70–99)
Glucose-Capillary: 163 mg/dL — ABNORMAL HIGH (ref 70–99)
Glucose-Capillary: 166 mg/dL — ABNORMAL HIGH (ref 70–99)
Glucose-Capillary: 174 mg/dL — ABNORMAL HIGH (ref 70–99)
Glucose-Capillary: 178 mg/dL — ABNORMAL HIGH (ref 70–99)
Glucose-Capillary: 178 mg/dL — ABNORMAL HIGH (ref 70–99)
Glucose-Capillary: 183 mg/dL — ABNORMAL HIGH (ref 70–99)
Glucose-Capillary: 209 mg/dL — ABNORMAL HIGH (ref 70–99)
Glucose-Capillary: 210 mg/dL — ABNORMAL HIGH (ref 70–99)
Glucose-Capillary: 212 mg/dL — ABNORMAL HIGH (ref 70–99)
Glucose-Capillary: 213 mg/dL — ABNORMAL HIGH (ref 70–99)
Glucose-Capillary: 234 mg/dL — ABNORMAL HIGH (ref 70–99)
Glucose-Capillary: 67 mg/dL — ABNORMAL LOW (ref 70–99)

## 2020-02-03 LAB — LACTATE DEHYDROGENASE: LDH: 1130 U/L — ABNORMAL HIGH (ref 98–192)

## 2020-02-03 LAB — CBC
HCT: 27.1 % — ABNORMAL LOW (ref 36.0–46.0)
HCT: 25.9 % — ABNORMAL LOW (ref 36.0–46.0)
Hemoglobin: 8.5 g/dL — ABNORMAL LOW (ref 12.0–15.0)
Hemoglobin: 8.2 g/dL — ABNORMAL LOW (ref 12.0–15.0)
MCH: 32 pg (ref 26.0–34.0)
MCH: 32.5 pg (ref 26.0–34.0)
MCHC: 31.4 g/dL (ref 30.0–36.0)
MCHC: 31.7 g/dL (ref 30.0–36.0)
MCV: 101.9 fL — ABNORMAL HIGH (ref 80.0–100.0)
MCV: 102.8 fL — ABNORMAL HIGH (ref 80.0–100.0)
Platelets: 61 10*3/uL — ABNORMAL LOW (ref 150–400)
Platelets: 61 10*3/uL — ABNORMAL LOW (ref 150–400)
RBC: 2.66 MIL/uL — ABNORMAL LOW (ref 3.87–5.11)
RBC: 2.52 MIL/uL — ABNORMAL LOW (ref 3.87–5.11)
RDW: 23.9 % — ABNORMAL HIGH (ref 11.5–15.5)
RDW: 24.7 % — ABNORMAL HIGH (ref 11.5–15.5)
WBC: 16.3 10*3/uL — ABNORMAL HIGH (ref 4.0–10.5)
WBC: 13.6 10*3/uL — ABNORMAL HIGH (ref 4.0–10.5)
nRBC: 17.1 % — ABNORMAL HIGH (ref 0.0–0.2)
nRBC: 16.9 % — ABNORMAL HIGH (ref 0.0–0.2)

## 2020-02-03 LAB — POCT I-STAT EG7
Acid-Base Excess: 1 mmol/L (ref 0.0–2.0)
Acid-base deficit: 2 mmol/L (ref 0.0–2.0)
Bicarbonate: 24.3 mmol/L (ref 20.0–28.0)
Bicarbonate: 24.9 mmol/L (ref 20.0–28.0)
Calcium, Ion: 1.12 mmol/L — ABNORMAL LOW (ref 1.15–1.40)
Calcium, Ion: 1.08 mmol/L — ABNORMAL LOW (ref 1.15–1.40)
HCT: 25 % — ABNORMAL LOW (ref 36.0–46.0)
HCT: 26 % — ABNORMAL LOW (ref 36.0–46.0)
Hemoglobin: 8.5 g/dL — ABNORMAL LOW (ref 12.0–15.0)
Hemoglobin: 8.8 g/dL — ABNORMAL LOW (ref 12.0–15.0)
O2 Saturation: 58 %
O2 Saturation: 89 %
Patient temperature: 35.8
Patient temperature: 36.9
Potassium: 3.6 mmol/L (ref 3.5–5.1)
Potassium: 4.3 mmol/L (ref 3.5–5.1)
Sodium: 139 mmol/L (ref 135–145)
Sodium: 139 mmol/L (ref 135–145)
TCO2: 26 mmol/L (ref 22–32)
TCO2: 26 mmol/L (ref 22–32)
pCO2, Ven: 44.5 mmHg (ref 44.0–60.0)
pCO2, Ven: 37.3 mmHg — ABNORMAL LOW (ref 44.0–60.0)
pH, Ven: 7.339 (ref 7.250–7.430)
pH, Ven: 7.432 — ABNORMAL HIGH (ref 7.250–7.430)
pO2, Ven: 30 mmHg — CL (ref 32.0–45.0)
pO2, Ven: 55 mmHg — ABNORMAL HIGH (ref 32.0–45.0)

## 2020-02-03 LAB — FIBRINOGEN: Fibrinogen: 611 mg/dL — ABNORMAL HIGH (ref 210–475)

## 2020-02-03 LAB — HEPATIC FUNCTION PANEL
ALT: 165 U/L — ABNORMAL HIGH (ref 0–44)
AST: 162 U/L — ABNORMAL HIGH (ref 15–41)
Albumin: 2.3 g/dL — ABNORMAL LOW (ref 3.5–5.0)
Alkaline Phosphatase: 252 U/L — ABNORMAL HIGH (ref 38–126)
Bilirubin, Direct: 0.7 mg/dL — ABNORMAL HIGH (ref 0.0–0.2)
Indirect Bilirubin: 0.9 mg/dL (ref 0.3–0.9)
Total Bilirubin: 1.6 mg/dL — ABNORMAL HIGH (ref 0.3–1.2)
Total Protein: 5.7 g/dL — ABNORMAL LOW (ref 6.5–8.1)

## 2020-02-03 LAB — PROTIME-INR
INR: 1.1 (ref 0.8–1.2)
Prothrombin Time: 13.9 seconds (ref 11.4–15.2)

## 2020-02-03 LAB — MAGNESIUM: Magnesium: 2.6 mg/dL — ABNORMAL HIGH (ref 1.7–2.4)

## 2020-02-03 LAB — PHOSPHORUS: Phosphorus: 2.6 mg/dL (ref 2.5–4.6)

## 2020-02-03 LAB — APTT
aPTT: 40 seconds — ABNORMAL HIGH (ref 24–36)
aPTT: 40 seconds — ABNORMAL HIGH (ref 24–36)

## 2020-02-03 LAB — HEPARIN LEVEL (UNFRACTIONATED): Heparin Unfractionated: <0.1 IU/mL — ABNORMAL LOW (ref 0.30–0.70)

## 2020-02-03 LAB — LACTIC ACID, PLASMA
Lactic Acid, Venous: 2.3 mmol/L (ref 0.5–1.9)
Lactic Acid, Venous: 1.4 mmol/L (ref 0.5–1.9)

## 2020-02-03 MED ORDER — PRISMASOL BGK 4/2.5 32-4-2.5 MEQ/L REPLACEMENT SOLN: Status: DC

## 2020-02-03 MED ORDER — PRISMASOL BGK 4/2.5 32-4-2.5 MEQ/L REPLACEMENT SOLN
Status: DC
  Filled 2020-02-03: qty 5000

## 2020-02-03 MED ORDER — PIVOT 1.5 CAL PO LIQD
1000.0000 mL | ORAL | Status: DC
  Administered 2020-02-03 – 2020-02-06 (×4): 1000 mL
  Filled 2020-02-03: qty 1000

## 2020-02-03 MED ORDER — PRISMASOL BGK 0/2.5 32-2.5 MEQ/L IV SOLN
INTRAVENOUS | Status: DC
  Filled 2020-02-03 (×25): qty 5000

## 2020-02-03 NOTE — Progress Notes (Signed)
[facility] NEPHROLOGY PROGRESS NOTE  Assessment/ Plan: Pt is a 54 y.o. yo female  With PMH of DM2, obesity, NAFLD who present w/ acute hypoxic respiratory failure due to Covid.  Hospitalization complicated by severe shock, refractory hypoxia requiring ECMO, multiple infections, intracranial hemorrhage, encephalopathy, AKI.  # Anuric AKI due to shock, sepsis causing acute tubular necrosis: Started CRRT on 8/22 for uremia and volume overload.  She has ECMO. No clinical improvement.  She has fluid overload and gained significant amount of weight. Potassium 3.8 therefore changed potassium bath to all 4K.  Monitor lab.  Tolerating UF about 100 cc an hour. Blood pressure now acceptable on multiple pressors and inotropes. Continue CRRT, prognosis grim.  #Shock due to sepsis, Covid infection, Enterococcus bacteremia: On pressors, antibiotics per primary team.  #Acute hypoxic and hypercapnic respiratory failure, ARDS due to COVID-19 pneumonia: Currently on ECMO for refractory hypoxemia, MSSA pneumonia.  On broad-spectrum antibiotics.   #Hypernatremia, hypervolemia: relative free water deficit.  Improved with CRRT.  #Acute toxic metabolic encephalopathy: Multifactorial with uremia contributing.  Treatment with CRRT as above.  #Anemia, thrombocytopenia: With hemolysis likely and acute illness contributing.  Transfusions per primary team.  Hematology is following.  #Intracranial hemorrhage: With some left-sided weakness preceded by primary team.  Avoiding heparin products as able.  #GOC: Multiorgan failure.  Had family meeting on 8/25, continue full scope of treatment.  Long-term prognosis poor.  Subjective: Seen and examined.  Remains anuric.  Tolerating UF with CRRT.  No new event.  Minimally alert/opens eyes with the name. Objective Vital signs in last 24 hours: Vitals:   02/03/20 0600 02/03/20 0700 02/03/20 0751 02/03/20 0800  BP:      Pulse: 95 98 93 91  Resp:   15    Temp: (!) 96.4 F (35.8 C) (!) 96.4 F (35.8 C)  (!) 97 F (36.1 C)  TempSrc:  Esophageal  Esophageal  SpO2: (!) 85% (!) 88% 93% 92%  Weight:      Height:       Weight change: -4.5 kg  Intake/Output Summary (Last 24 hours) at 02/03/2020 1006 Last data filed at 02/03/2020 0900 Gross per 24 hour  Intake 4338.31 ml  Output 8426 ml  Net -4087.69 ml       Labs: Basic Metabolic Panel: Recent Labs  Lab 02/01/20 0226 02/01/20 0635 02/02/20 0330 02/02/20 0436 02/02/20 1611 02/02/20 1939 02/03/20 0313 02/03/20 0313 02/03/20 0437 02/03/20 0445 02/03/20 0833  NA 137   < > 135   < > 136   < > 137   < > 139 139 140  K 4.3   < > 4.2   < > 4.1   < > 3.6   < > 3.6 3.8 4.2  CL 101   < > 100  --  101  --  102  --   --   --   --   CO2 23   < > 24  --  23  --  24  --   --   --   --   GLUCOSE 207*   < > 182*  --  224*  --  217*  --   --   --   --   BUN 50*   < > 42*  --  49*  --  47*  --   --   --   --   CREATININE 1.05*   < > 1.10*  --  1.20*  --  1.15*  --   --   --   --  CALCIUM 7.3*   < > 7.7*  --  7.6*  --  7.8*  --   --   --   --   PHOS 4.4  --  3.7  --   --   --  2.6  --   --   --   --    < > = values in this interval not displayed.   Liver Function Tests: Recent Labs  Lab 02/01/20 0226 02/02/20 0330 02/03/20 0313  AST 245* 195* 162*  ALT 118* 134* 165*  ALKPHOS 204* 259* 252*  BILITOT 1.9* 1.9* 1.6*  PROT 4.9* 5.4* 5.7*  ALBUMIN 2.3* 2.4* 2.3*   No results for input(s): LIPASE, AMYLASE in the last 168 hours. No results for input(s): AMMONIA in the last 168 hours. CBC: Recent Labs  Lab 02/01/20 1700 02/01/20 1856 02/02/20 0330 02/02/20 0436 02/02/20 1611 02/02/20 1939 02/02/20 1948 02/02/20 1948 02/03/20 0313 02/03/20 0313 02/03/20 0437 02/03/20 0445 02/03/20 0833  WBC 20.9*   < > 20.2*   < > 16.8*  --  14.0*  --  16.3*  --   --   --   --   HGB 8.1*   < > 7.7*   < > 8.4*   < > 8.1*   < > 8.5*   < > 8.5* 8.5* 8.5*  HCT 25.5*   < > 24.4*   < > 26.5*    < > 24.9*   < > 27.1*   < > 25.0* 25.0* 25.0*  MCV 100.8*  --  103.4*  --  100.4*  --  100.8*  --  101.9*  --   --   --   --   PLT 53*   < > 61*   < > 57*  --  50*  --  61*  --   --   --   --    < > = values in this interval not displayed.   Cardiac Enzymes: No results for input(s): CKTOTAL, CKMB, CKMBINDEX, TROPONINI in the last 168 hours. CBG: Recent Labs  Lab 02/03/20 0435 02/03/20 0537 02/03/20 0637 02/03/20 0830 02/03/20 0949  GLUCAP 154* 163* 155* 135* 67*    Iron Studies: No results for input(s): IRON, TIBC, TRANSFERRIN, FERRITIN in the last 72 hours. Studies/Results: DG CHEST PORT 1 VIEW  Result Date: 02/02/2020 CLINICAL DATA:  ETT, respiratory failure, on ECMO EXAM: PORTABLE CHEST 1 VIEW COMPARISON:  02/01/2020 FINDINGS: Diffuse opacification of the lungs bilaterally with associated ECMO catheter. Endotracheal tube terminates 4.5 cm above the carina. Left IJ venous catheter terminates in the mid SVC. Enteric tube courses into the stomach. Cardiomediastinal silhouette is obscured. IMPRESSION: Diffuse opacification of the lungs bilaterally with associated ECMO catheter. Endotracheal tube terminates 4.5 cm above the carina. Additional support apparatus as above. Electronically Signed   By: Julian Hy M.D.   On: 02/02/2020 07:43    Medications: Infusions: .  prismasol BGK 4/2.5    .  prismasol BGK 4/2.5 300 mL/hr at 02/03/20 0824  . sodium chloride 500 mL (02/01/20 1958)  . albumin human 12.5 g (01/28/20 1229)  . albumin human    . amiodarone 30 mg/hr (02/03/20 0700)  . anidulafungin Stopped (02/02/20 1915)  . dexmedetomidine (PRECEDEX) IV infusion 1 mcg/kg/hr (02/03/20 0705)  . feeding supplement (PIVOT 1.5 CAL) 75 mL/hr at 02/03/20 0800  . heparin 400 Units/hr (02/03/20 0700)  . HYDROmorphone 3 mg/hr (02/03/20 0700)  . insulin 9 Units/hr (02/03/20 0834)  . milrinone 0.125 mcg/kg/min (  02/03/20 0700)  . norepinephrine (LEVOPHED) Adult infusion 2 mcg/min (02/03/20  0700)  . piperacillin-tazobactam (ZOSYN)  IV Stopped (02/03/20 0618)  . prismasol BGK 4/2.5 2,000 mL/hr at 02/03/20 0823  . vasopressin 0.04 Units/min (02/03/20 0708)    Scheduled Medications: . sodium chloride   Intravenous Once  . sodium chloride   Intravenous Once  . vitamin C  500 mg Per Tube Daily  . B-complex with vitamin C  1 tablet Oral Daily  . chlorhexidine gluconate (MEDLINE KIT)  15 mL Mouth Rinse BID  . Chlorhexidine Gluconate Cloth  6 each Topical Daily  . clonazePAM  1 mg Per Tube Q6H  . docusate  100 mg Per Tube BID  . feeding supplement (PROSource TF)  45 mL Per Tube TID  . fentaNYL (SUBLIMAZE) injection  50 mcg Intravenous Once  . linagliptin  5 mg Per Tube Daily  . mouth rinse  15 mL Mouth Rinse 10 times per day  . oxyCODONE  10 mg Per Tube Q6H  . pantoprazole (PROTONIX) IV  40 mg Intravenous Daily  . polyethylene glycol  17 g Per Tube Daily  . sennosides  5 mL Per Tube BID  . zinc sulfate  220 mg Per Tube Daily    have reviewed scheduled and prn medications.  Physical Exam: General: Critically ill female, has ECMO running.  Clinically not much change Heart:RRR, s1s2 nl Lungs: Coarse breath sound bilateral Abdomen:soft, non-distended Extremities: Anasarca and all extremities has edema. Dialysis Access: Temporary HD catheter  Alvan Culpepper Tanna Furry 02/03/2020,10:06 AM  LOS: 26 days  Pager: [phone]

## 2020-02-03 NOTE — Plan of Care (Signed)
Problem: Education: Goal: Knowledge of General Education information will improve Description: Including pain rating scale, medication(s)/side effects and non-pharmacologic comfort measures Outcome: Progressing   Problem: Health Behavior/Discharge Planning: Goal: Ability to manage health-related needs will improve Outcome: Progressing   Problem: Clinical Measurements: Goal: Ability to maintain clinical measurements within normal limits will improve Outcome: Progressing Goal: Will remain free from infection Outcome: Progressing Goal: Diagnostic test results will improve Outcome: Progressing Goal: Respiratory complications will improve Outcome: Progressing Goal: Cardiovascular complication will be avoided Outcome: Progressing   Problem: Activity: Goal: Risk for activity intolerance will decrease Outcome: Progressing   Problem: Nutrition: Goal: Adequate nutrition will be maintained Outcome: Progressing   Problem: Coping: Goal: Level of anxiety will decrease Outcome: Progressing   Problem: Elimination: Goal: Will not experience complications related to bowel motility Outcome: Progressing Goal: Will not experience complications related to urinary retention Outcome: Progressing   Problem: Pain Managment: Goal: General experience of comfort will improve Outcome: Progressing   Problem: Safety: Goal: Ability to remain free from injury will improve Outcome: Progressing   Problem: Skin Integrity: Goal: Risk for impaired skin integrity will decrease Outcome: Progressing   Problem: Education: Goal: Knowledge of risk factors and measures for prevention of condition will improve Outcome: Progressing   Problem: Coping: Goal: Psychosocial and spiritual needs will be supported Outcome: Progressing   Problem: Respiratory: Goal: Will maintain a patent airway Outcome: Progressing Goal: Complications related to the disease process, condition or treatment will be avoided or  minimized Outcome: Progressing   Problem: Education: Goal: Knowledge of risk factors and measures for prevention of condition will improve Outcome: Progressing   Problem: Coping: Goal: Psychosocial and spiritual needs will be supported Outcome: Progressing   Problem: Respiratory: Goal: Will maintain a patent airway Outcome: Progressing Goal: Complications related to the disease process, condition or treatment will be avoided or minimized Outcome: Progressing   Problem: Education: Goal: Ability to describe self-care measures that may prevent or decrease complications (Diabetes Survival Skills Education) will improve Outcome: Progressing Goal: Individualized Educational Video(s) Outcome: Progressing   Problem: Coping: Goal: Ability to adjust to condition or change in health will improve Outcome: Progressing   Problem: Fluid Volume: Goal: Ability to maintain a balanced intake and output will improve Outcome: Progressing   Problem: Health Behavior/Discharge Planning: Goal: Ability to identify and utilize available resources and services will improve Outcome: Progressing Goal: Ability to manage health-related needs will improve Outcome: Progressing   Problem: Metabolic: Goal: Ability to maintain appropriate glucose levels will improve Outcome: Progressing   Problem: Nutritional: Goal: Maintenance of adequate nutrition will improve Outcome: Progressing Goal: Progress toward achieving an optimal weight will improve Outcome: Progressing   Problem: Skin Integrity: Goal: Risk for impaired skin integrity will decrease Outcome: Progressing   Problem: Tissue Perfusion: Goal: Adequacy of tissue perfusion will improve Outcome: Progressing   Problem: Education: Goal: Knowledge of risk factors and measures for prevention of condition will improve Outcome: Progressing   Problem: Coping: Goal: Psychosocial and spiritual needs will be supported Outcome: Progressing    Problem: Respiratory: Goal: Will maintain a patent airway Outcome: Progressing Goal: Complications related to the disease process, condition or treatment will be avoided or minimized Outcome: Progressing   Problem: Education: Goal: Knowledge of disease or condition will improve Outcome: Progressing Goal: Knowledge of secondary prevention will improve Outcome: Progressing Goal: Knowledge of patient specific risk factors addressed and post discharge goals established will improve Outcome: Progressing Goal: Individualized Educational Video(s) Outcome: Progressing   Problem: Coping: Goal: Will verbalize  positive feelings about self Outcome: Progressing Goal: Will identify appropriate support needs Outcome: Progressing   Problem: Health Behavior/Discharge Planning: Goal: Ability to manage health-related needs will improve Outcome: Progressing   Problem: Self-Care: Goal: Ability to participate in self-care as condition permits will improve Outcome: Progressing Goal: Verbalization of feelings and concerns over difficulty with self-care will improve Outcome: Progressing Goal: Ability to communicate needs accurately will improve Outcome: Progressing   Problem: Nutrition: Goal: Risk of aspiration will decrease Outcome: Progressing Goal: Dietary intake will improve Outcome: Progressing   Problem: Intracerebral Hemorrhage Tissue Perfusion: Goal: Complications of Intracerebral Hemorrhage will be minimized Outcome: Progressing   Problem: Ischemic Stroke/TIA Tissue Perfusion: Goal: Complications of ischemic stroke/TIA will be minimized Outcome: Progressing   Problem: Spontaneous Subarachnoid Hemorrhage Tissue Perfusion: Goal: Complications of Spontaneous Subarachnoid Hemorrhage will be minimized Outcome: Progressing

## 2020-02-03 NOTE — Progress Notes (Signed)
ANTICOAGULATION CONSULT NOTE - [place] for Heparin Indication: ECMO  Allergies  Allergen Reactions  . Sulfa Antibiotics Rash and Other (See Comments)    Patient Measurements: Height: 5\' 4"  (162.6 cm) Weight: 126.4 kg (278 lb 10.6 oz) IBW/kg (Calculated) : 54.7 Heparin Dosing Weight: 87 kg  Vital Signs: Temp: 97 F (36.1 C) (08/28 0800) Temp Source: Esophageal (08/28 0800) Pulse Rate: 91 (08/28 0800)  Labs: Recent Labs    02/01/20 0226 02/01/20 1497 02/02/20 0330 02/02/20 0436 02/02/20 0263 02/02/20 0849 02/02/20 1611 02/02/20 1939 02/02/20 1948 02/02/20 1948 02/03/20 0313 02/03/20 0313 02/03/20 0437 02/03/20 0437 02/03/20 0445 02/03/20 0833  HGB 8.1*   < > 7.7*   < >  --    < > 8.4*   < > 8.1*   < > 8.5*   < > 8.5*   < > 8.5* 8.5*  HCT 25.4*   < > 24.4*   < >  --    < > 26.5*   < > 24.9*   < > 27.1*   < > 25.0*  --  25.0* 25.0*  PLT 46*   < > 61*   < >  --   --  57*  --  50*  --  61*  --   --   --   --   --   APTT 38*   < > 43*  --   --   --  43*  --   --   --  40*  --   --   --   --   --   LABPROT 15.3*  --  15.3*  --   --   --   --   --   --   --  13.9  --   --   --   --   --   INR 1.3*  --  1.3*  --   --   --   --   --   --   --  1.1  --   --   --   --   --   HEPARINUNFRC <0.10*  --   --   --  <0.10*  --   --   --   --   --  <0.10*  --   --   --   --   --   CREATININE 1.05*   < > 1.10*  --   --   --  1.20*  --   --   --  1.15*  --   --   --   --   --    < > = values in this interval not displayed.    Estimated Creatinine Clearance: 72.8 mL/min (A) (by C-G formula based on SCr of 1.15 mg/dL (H)).   Medical History: Past Medical History:  Diagnosis Date  . Asthma   . Diabetes mellitus without complication ([place])   . Diverticulitis   . Gallstones   . IBS (irritable bowel syndrome)   . NAFLD (nonalcoholic fatty liver disease)    CT scan 2015  . Ovarian cyst     Assessment: 54 yo female who is COVID+ on VV ECMO. Patient was  started on bivalirudin but was found to have multiple ICH on 8/13 head CT. Bivalirudin was stopped at this time and pt has been off anticoagulation. Her ECMO circuit was changed 8/24, and pharmacy asked to start low-fixed rate heparin.  Heparin level remains undetectable on  fixed, low-dose heparin. Will keep rate the same and not titrate to goal at this time per discussion with ECMO team. LDH 1288 > 1130. Fibrinogen also up to 611. Hgb 8.5 (s/p 1u PRBC), plts up slightly 61. No bleeding per discussion with RN. ECMO circuit is stable.  Goal of Therapy:  Heparin level 0.2-0.5 units/ml - currently not titrating to goal per discussion with ECMO team Monitor platelets by anticoagulation protocol: Yes   Plan:  Continue heparin 400 units/hr at fixed rate Check daily HL Monitor daily CBC, signs of bleeding, and ECMO circuit  [place] Crane, Roylene Reason, [facility] Clinical Pharmacist  02/03/2020 9:17 AM   [facility] pharmacy phone numbers are listed on [email or web address]  Please check [email or web address] for unit-specific pharmacy phone numbers.

## 2020-02-03 NOTE — CV Procedure (Signed)
ECMO NOTE:  Indication: Respiratory failure due to COVID PNA  Initial cannulation date: 01/19/2020  ECMO type: VV ECMO (Centrimag with oximizer)  Dual lumen Inflow/return cannula: 32 FR RIJ [place] placed 01/23/2020  ECMO events:  - Initial cannulation 01/16/2020 - Cannula repositioned (pulled back ) 01/17/20 - Off bival 8/13 due to [place] - circuit change 8/24 and cannula pulled back  Daily data:  Flow5.0 RPM 4200 Sweep 13  Labs:  ABG    Component Value Date/Time   PHART 7.432 02/03/2020 1310   PCO2ART 40.0 02/03/2020 1310   PO2ART 68 (L) 02/03/2020 1310   HCO3 26.7 02/03/2020 1310   TCO2 28 02/03/2020 1310   ACIDBASEDEF 2.0 02/03/2020 0437   O2SAT 94.0 02/03/2020 1310     BG remain high despite insulin drip-as high as low 200s BUN/Cr improved on CRRT LDH1250> 3335>4562 Fibrinogen 316 > 521>611 INR 4> 1.3 PTT 40 HgB8.1>7.7 Plts32> 46> 61 WBC  11.8> 19 > 20.2 LA 2.3  Assessment: -Severe COVID ARDS s/p VV ECMO -Enterococcal bacteremia> cleared -MSSA pneumonia; repeat cultures pending- growing yeast -Thrombocytopenia, refractory- combination of hemolysis, bone marrow stunning, and sepsis effect, possibly contribution of chronic liver dysfunction  - Intracerebral hemorrhage with question of septic emboli; on very low dose AC with new circuit -Ischemic digits both hands, right worse than left - Muscular deconditioning - Acute kidney failure requiring CVVHD - progressive MOF  Plan: -Continue VV ECMO support. No significant change in thrombocytopenia post-circuit change.  -Con't MV ultra-lung protective. Pplat 24. -Low-dose heparin for circuit change and to prevent clotting of new circuit -Continue CVVHD -Continue insulin gtt-does not absorb subcu insulin -zosyn per ID, treating empirically for endocarditis without TEE.  Continue empirically treating for endocarditis given concern for vascular emboli. -Limited code, daily updates with family.  -Off covid  isolation   Multidisciplinary rounds with cardiology, critical care medicine, cardiothoracic surgery, coordinator, PharmDs, RTs, and all nurses present.  Kipp Brood, MD  02/03/20 2:35 PM [facility]

## 2020-02-03 NOTE — Progress Notes (Signed)
Patient ID: Alisha Stephenson, female   DOB: 02-Apr-1966, 54 y.o.   MRN: [id number]    Advanced Heart Failure Rounding Note   Subjective:    - 8/2 COVID + test - 8/9 Cannulated for VV ECMO - 8/13 with several areas of intracranial hemorrhage. Bival stopped.  - 8/14 CT no change in [place]. Increased edema - 8/16 Extubated - 8/16 Head CT stable bleed - 8/22 CVVHD started - 8/24 reintubated - 8/24 circuit changed  Remains awake on vent. ?Follows some commands per nursing.   Remains on CVVHD, pulling 100 cc/hr net negative UF with I/O 3.6 L negative. Remains anuric  Heparin in circuit at 400u/hr. Hgb up to 8.5.   LDH remains high but coming down. PLTs and fibrinogen climbing with new circuit.   Eraxis for fungal coverage, Zosyn for Enterococcal bacteremina.   Currently on vasopressin 0.04 and off NE.  Remains on milrinone 0.125 for RV.   ECMO   Flow 5.10 L RPM 4200 Sweep15  Labs:  7.39/41/56/90% Hgb8.5 PLT 32k -> 46k -> 61 LDH 429 -> 451 -> 576 -> 527 -> 547 -> 655 -> 917 -> 1,141 -> 1250 -> 1,288 -> 1130 Lactic acid1.3 -> 1.9 -> 2.3 Fibrinogen 176 -> 316 -> 521 -> 611  Objective:   Weight Range:  Vital Signs:   Temp:  [95 F (35 C)-98.2 F (36.8 C)] 97 F (36.1 C) (08/28 0800) Pulse Rate:  [76-107] 91 (08/28 0800) Resp:  [15-19] 15 (08/28 0751) SpO2:  [82 %-100 %] 92 % (08/28 0800) Arterial Line BP: (88-229)/(44-71) 149/59 (08/28 0800) FiO2 (%):  [60 %] 60 % (08/28 0800) Weight:  [126.4 kg] 126.4 kg (08/28 0500) Last BM Date: 02/02/20  Weight change: Filed Weights   02/01/20 0500 02/02/20 0500 02/03/20 0500  Weight: 133.5 kg 130.9 kg 126.4 kg    Intake/Output:   Intake/Output Summary (Last 24 hours) at 02/03/2020 5400 Last data filed at 02/03/2020 0700 Gross per 24 hour  Intake 5042.74 ml  Output 8505 ml  Net -3462.26 ml     Physical Exam: General: Awake Neck: JVP difficult, no thyromegaly or thyroid nodule.  Lungs: Decreased breath sounds.  CV:  Nondisplaced PMI.  Heart regular S1/S2, no S3/S4, no murmur.  Trace ankle edema.  Abdomen: Soft, nontender, no hepatosplenomegaly, no distention.  Skin: Intact without lesions or rashes.  Neurologic:Awake, follows some commands per nursing.  Extremities: Ischemic changes hands HEENT: Normal.   Telemetry: Sinus 90-110 Personally reviewed   Labs: Basic Metabolic Panel: Recent Labs  Lab 01/30/20 0324 01/30/20 0538 01/31/20 0300 01/31/20 0448 02/01/20 0226 02/01/20 0635 02/01/20 1700 02/01/20 1856 02/02/20 0330 02/02/20 0436 02/02/20 1611 02/02/20 1939 02/03/20 0313 02/03/20 0437 02/03/20 0445  NA 141   < > 134*   < > 137   < > 136   < > 135   < > 136 140 137 139 139  K 4.7   < > 3.9   < > 4.3   < > 4.4   < > 4.2   < > 4.1 4.2 3.6 3.6 3.8  CL 103   < > 101   < > 101  --  102  --  100  --  101  --  102  --   --   CO2 25   < > 22   < > 23  --  25  --  24  --  23  --  24  --   --  GLUCOSE 164*   < > 278*   < > 207*  --  194*  --  182*  --  224*  --  217*  --   --   BUN 95*   < > 73*   < > 50*  --  44*  --  42*  --  49*  --  47*  --   --   CREATININE 1.31*   < > 1.26*   < > 1.05*  --  1.08*  --  1.10*  --  1.20*  --  1.15*  --   --   CALCIUM 8.0*   < > 7.1*   < > 7.3*  --  7.5*   < > 7.7*  --  7.6*  --  7.8*  --   --   MG 2.9*  --  2.6*  --  2.7*  --   --   --  2.8*  --   --   --  2.6*  --   --   PHOS 6.0*  --  6.1*  --  4.4  --   --   --  3.7  --   --   --  2.6  --   --    < > = values in this interval not displayed.    Liver Function Tests: Recent Labs  Lab 01/30/20 0324 01/31/20 0300 02/01/20 0226 02/02/20 0330 02/03/20 0313  AST 123* 206* 245* 195* 162*  ALT 69* 88* 118* 134* 165*  ALKPHOS 139* 155* 204* 259* 252*  BILITOT 1.8* 1.6* 1.9* 1.9* 1.6*  PROT 5.3* 4.9* 4.9* 5.4* 5.7*  ALBUMIN 2.9* 2.4* 2.3* 2.4* 2.3*   No results for input(s): LIPASE, AMYLASE in the last 168 hours. No results for input(s): AMMONIA in the last 168 hours.  CBC: Recent Labs  Lab  02/01/20 1700 02/01/20 1856 02/02/20 0330 02/02/20 0436 02/02/20 1611 02/02/20 1611 02/02/20 1939 02/02/20 1948 02/03/20 0313 02/03/20 0437 02/03/20 0445  WBC 20.9*  --  20.2*  --  16.8*  --   --  14.0* 16.3*  --   --   HGB 8.1*   < > 7.7*   < > 8.4*   < > 7.8* 8.1* 8.5* 8.5* 8.5*  HCT 25.5*   < > 24.4*   < > 26.5*   < > 23.0* 24.9* 27.1* 25.0* 25.0*  MCV 100.8*  --  103.4*  --  100.4*  --   --  100.8* 101.9*  --   --   PLT 53*  --  61*  --  57*  --   --  50* 61*  --   --    < > = values in this interval not displayed.    Cardiac Enzymes: No results for input(s): CKTOTAL, CKMB, CKMBINDEX, TROPONINI in the last 168 hours.  BNP: BNP (last 3 results) No results for input(s): BNP in the last 8760 hours.  ProBNP (last 3 results) No results for input(s): PROBNP in the last 8760 hours.    Other results:  Imaging: DG CHEST PORT 1 VIEW  Result Date: 02/02/2020 CLINICAL DATA:  ETT, respiratory failure, on ECMO EXAM: PORTABLE CHEST 1 VIEW COMPARISON:  02/01/2020 FINDINGS: Diffuse opacification of the lungs bilaterally with associated ECMO catheter. Endotracheal tube terminates 4.5 cm above the carina. Left IJ venous catheter terminates in the mid SVC. Enteric tube courses into the stomach. Cardiomediastinal silhouette is obscured. IMPRESSION: Diffuse opacification of the lungs bilaterally with associated ECMO catheter. Endotracheal  tube terminates 4.5 cm above the carina. Additional support apparatus as above. Electronically Signed   By: Julian Hy M.D.   On: 02/02/2020 07:43     Medications:     Scheduled Medications: . sodium chloride   Intravenous Once  . sodium chloride   Intravenous Once  . vitamin C  500 mg Per Tube Daily  . B-complex with vitamin C  1 tablet Oral Daily  . chlorhexidine gluconate (MEDLINE KIT)  15 mL Mouth Rinse BID  . Chlorhexidine Gluconate Cloth  6 each Topical Daily  . clonazePAM  1 mg Per Tube Q6H  . docusate  100 mg Per Tube BID  . feeding  supplement (PROSource TF)  45 mL Per Tube TID  . fentaNYL (SUBLIMAZE) injection  50 mcg Intravenous Once  . linagliptin  5 mg Per Tube Daily  . mouth rinse  15 mL Mouth Rinse 10 times per day  . oxyCODONE  10 mg Per Tube Q6H  . pantoprazole (PROTONIX) IV  40 mg Intravenous Daily  . polyethylene glycol  17 g Per Tube Daily  . sennosides  5 mL Per Tube BID  . zinc sulfate  220 mg Per Tube Daily    Infusions: .  prismasol BGK 4/2.5    .  prismasol BGK 4/2.5    . sodium chloride 500 mL (02/01/20 1958)  . albumin human 12.5 g (01/28/20 1229)  . albumin human    . amiodarone 30 mg/hr (02/03/20 0700)  . anidulafungin Stopped (02/02/20 1915)  . dexmedetomidine (PRECEDEX) IV infusion 1 mcg/kg/hr (02/03/20 0705)  . feeding supplement (PIVOT 1.5 CAL) 75 mL/hr at 02/03/20 0700  . heparin 400 Units/hr (02/03/20 0700)  . HYDROmorphone 3 mg/hr (02/03/20 0700)  . insulin 14 Units/hr (02/03/20 0709)  . milrinone 0.125 mcg/kg/min (02/03/20 0700)  . norepinephrine (LEVOPHED) Adult infusion 2 mcg/min (02/03/20 0700)  . piperacillin-tazobactam (ZOSYN)  IV Stopped (02/03/20 0618)  . prismasol BGK 4/2.5 2,000 mL/hr at 02/03/20 0540  . vasopressin 0.04 Units/min (02/03/20 0708)    PRN Medications: sodium chloride, acetaminophen (TYLENOL) oral liquid 160 mg/5 mL, albumin human, albuterol, dextrose, fentaNYL, fentaNYL (SUBLIMAZE) injection, guaiFENesin-dextromethorphan, heparin, hydrALAZINE, HYDROmorphone, hydrOXYzine, ondansetron **OR** ondansetron (ZOFRAN) IV, sodium chloride   Assessment/Plan:   1. Acute hypoxic/hypercapneic respiratory failure in setting of severe COVID PNA/ARDS -> VV ECMO - admit 8/2 - intubation 8/3 - has received actmera (compelted 8/2), remdesivir (completed 8/6) and steroids - failed full vent support with proning/paralytic -> Cannulated for VV ECMO on 8/9 - Extubated 8/16. Reintubated 8/24 - CXR unchanged -> diffuse infiltrates. Personally reviewed - Circuit changed 8/24  due to concern for hemolysis and worsening oxygenation. Oxygenation improved. LDH trending down and PLTs and fibrinogen trending up.  - CVVHD started 8/22 for volume removal and uremia. UF net 100 cc/hr. Remains anuric. Renal following. Keep mildly negative, renal following.  - Still with multi-system organ failure (brain, lungs, kidneys, liver, vascular injury).  Prognosis poor but seems to be following commands now. Palliative Care following  2. Enterococcus sepsis - back on high-dose zosyn per ID - Continue VP for BP support, now off NE.  - Eraxis added on 8/26 with yeast in BAL 8/24  3. Intracranial hemorrhage - ? Septic emboli - repeat head CT on 8/14 with stable bleeds but increased edema - neurology has seen. Suspect significant long-term injury sustained. Will follow commands. Appears to have dense LUE weakness and possibly LLE - repeat head CT stable 8/16 - Now back on low-dose heparin  to protect circuit from further clotting. Exam stable - Consider TEE at some point to further evalaute. Can consider now that she is re-intubated but won't change management currently  4. Thrombocytopenia - PLTs up to 61K today after circuit change - Continue to follow  5. Morbid obesity - Body mass index is Body mass index is 47.83 kg/m.  6. Poorly controlled DM2 - HgBA1c 10.7 - CBGs remain elevated - On IV insulin adjust as needed  6. Hypernatremia - Resolved with HD.   7. Lactic acidosis -  Lactate 1.3 -> 1.9 -> 2.3 today. MAP stable, ABG improving.  Will follow.   8. PAF - intermittent episodes. Last 8/26 - In NSR on IV amio this am. Continue   9. AKI/azotemia - CVVHD started on 8/22 - Net negative yesterday, continue gentle UF.   10. Ischemic R fingers - ? Due to septic embolic versus vascular issue - VVS following.   11. Acute liver failure - has underlying fatty liver - mix of conjugated/unconjugated   CRITICAL CARE Performed by: Loralie Champagne  Total critical  care time: 40 minutes  Critical care time was exclusive of separately billable procedures and treating other patients.  Critical care was necessary to treat or prevent imminent or life-threatening deterioration.  Critical care was time spent personally by me (independent of midlevel providers or residents) on the following activities: development of treatment plan with patient and/or surrogate as well as nursing, discussions with consultants, evaluation of patient's response to treatment, examination of patient, obtaining history from patient or surrogate, ordering and performing treatments and interventions, ordering and review of laboratory studies, ordering and review of radiographic studies, pulse oximetry and re-evaluation of patient's condition.   Length of Stay: 54   Loralie Champagne  MD 02/03/2020, 8:22 AM  Advanced Heart Failure Team Pager [phone] (M-F; [place])  Please contact [facility] for night-coverage after hours (4p -7a ) and weekends on [email or web address]

## 2020-02-03 NOTE — Progress Notes (Signed)
NAME:  Alisha Stephenson, MRN:  [id number], DOB:  1966-01-22, LOS: 20 ADMISSION DATE:  02/05/2020, CONSULTATION DATE:  8/3 REFERRING MD:  Alfredia Ferguson, CHIEF COMPLAINT:  Dyspnea   Brief History   54 y/o female admitted on 8/2 with severe acute respiratory failure with hypoxemia due to COVID 19 pneumonia.  She developed symptoms 1 week prior to admission.  Past Medical History  DM2 Diverticulitis Gallstones Ovarian cyst NAFLD Asthma  Significant Hospital Events   8/2 admit 8/3 ICU transfer, intubated 8/4 prone, paralyze 8/9 significant desaturations today 8/9 VV ECMO cannulation 8/13 ICH  Consults:  PCCM ECMO team   Procedures:  8/3 ETT >  8/3 PICC >  8/9 LIJ MML 8/9 RIJ [place] 47F   Significant Diagnostic Tests:  7/31 CT head > NAICP 7/31 MRI/MRA brain > no acute changes, possibly small aneurysm ACOM 8/13 CT head> multiple areas of ICH  Micro Data:  8/2 blood > NG 8/2 SARS COV 2 > positive 8/4 resp > negative 8/4 urine >  8/12 blood > E. faecalis (pan-sensitive) 8/12 resp: staph aureus> MSSA 8/25 blood>> 10K yeast 8/24 BAL> yeast  Antimicrobials:  8/2 remdesivir > 8/6 8/2 actemra  8/2 solumedrol >   8/3 ceftriaxone >  8/5 8/3 azithro >  8/5  8/12 meropenem>8/16 8/12 vanc>8/16  8/13 zosyn >> 8/20 , restart 8/23 Vanc 8/19>> 8/22 Fluconazole 8/19>> 8/22 Amp & ceftriaxone  8/22-8/23   Interim history/subjective:  No bleeding overnight.  Awake and following commands.  Objective   Blood pressure (!) 129/53, pulse 100, temperature 99.1 F (37.3 C), resp. rate 15, height _0  (1.626 m), weight 126.4 kg, SpO2 95 %.    Vent Mode: PCV FiO2 (%):  [60 %] 60 % Set Rate:  [15 bmp] 15 bmp PEEP:  [12 cmH20] 12 cmH20 Plateau Pressure:  [20 cmH20-23 cmH20] 23 cmH20   Intake/Output Summary (Last 24 hours) at 02/03/2020 1438 Last data filed at 02/03/2020 1400 Gross per 24 hour  Intake 4178.08 ml  Output 8560 ml  Net -4381.92 ml   Filed Weights   02/01/20 0500  02/02/20 0500 02/03/20 0500  Weight: 133.5 kg 130.9 kg 126.4 kg    Examination:  GEN: Critically ill-appearing woman lying in bed intubated, lightly sedated, on ECMO HEENT: [place]/AT, scleral edema with mild icterus CV: Regular rate and rhythm, no murmurs PULM: breathing synchronously with the vent, not breathing above the set rate, no breath sounds. GI: Obese, soft, nontender EXT: Ongoing edema in hands, feet.  Well demarcated cyanosis in digits on right hand, bruising over her knuckles and hand on the left, but not in a circumferential pattern as the right.  Wound on distal right toe associated with ingrown toenail-not erythematous.  Mild cyanosis of feet NEURO: RASS -2, tracking, nodding to answer questions sometimes.  Very weak globally.  Not moving left upper extremity.   Labs reviewed-fibrinogen and platelets slightly improved, LFTs not improving CXR 8/25 personally reviewed: Few air bronchograms in lower lobes, overall densely consolidated lungs.  ET tube 4 centimeters above the carina.  Left subclavian dialysis, right IJ ECMO cannula, L IJ CVC in place   Resolved Hospital Problem list     Assessment & Plan:  Acute hypoxic and AoC hypercapneic respiratory failure; likely underly OHS (baseline bicarb 27-30) ARDS due to COVID 19 pneumonia   VV ECMO cannulation on 8/9 for refractory hypoxemia MSSA pneumonia Possibly fungal infection vs contamination. Clinically consistent with infection causing sepsis. -Continue VV ECMO. Circuit change on 8/24, cannula  pulled back.  Continue all aggressive care measures.  Discussed with family at bedside today for concern of progressive multiorgan failure over the past few weeks despite maximal therapy for ARDS. -Con't MV. Trach when platelets improve, likely next week. -low dose heparin to prevent circuit clotting  Enterococcal bacteremia-recent blood cultures cleared Concern for possible embolic phenomenon- fingers and possibly CVAs were embolic   -Appreciate ID's assistance. Con't Eraxis, Zosyn.  -Appreciate vascular surgery's input. Will have to allow digits to demarcate. High risk for losing digits, which is especially concerning on her right/on that hand -Urine culture pending . Foley removed due to lack of UOP.  Elevated LDH, hypo-fibrinogenemia and thrombocytopenia; most likely due to sepsis and consumption from circuit.  Slowly improving. -Continue to monitor platelet count  ICH, multifocal. L-sided weakness.  Encephalopathy, improved -Continue CRRT - limit sedation as much as possible - reorientation, limit deliriogenic meds as much as possible. - PT & OT when able -Family visitation  Acute renal failure, severe uremia.  Anuric. -Continue CRRT; goal net negative. Have been limited by hypotension. -Strict I's/O  -Renally dose medications and avoid nephrotoxic meds.  Shock related to sedation vs sepsis and RV stunning- milrinone and PRN norepinephrine Septic shock- Enterococcus bacteremia Increased WBC, hypotension and question sepsis 8/19, question of septic emboli to explain head bleeds Concerning for fungal pulmonary infection versus colonization -ID following- appreciate their assistance.  Continue Zosyn and Eraxis -recultured on 8/25; BAL culture from 8/24 pending  -NE, vasopressin to maintain MAP >65--stable doses  Hyperglycemia > not controlled at all with Kayenta insulin despite aggressive upward titration.  Assume poor absorption, potentially due to subcutaneous edema.   Remains elevated on IV. -To remain on insulin infusion -Goal BG 140-180 while admitted to the ICU -Continue linagliptin to decrease insulin requirements  Constipation, resolved  Hypernatremia; resolved -ok to let sodium normalize -Not requiring free water  Acute anemia due to critical illness Acute thrombocytopenia- refractory to platelet transfusion, not better after circuit change Coagulopathy- concern for underlying liver  dysfunction -transfuse for Hb <8 -con't to monitor   Hyperbilirubinemia (increased direct and indirect), elevated transaminases coagulopathy Concern for progressive MOF over her critical illness-relatively stable but persistent -Appreciate palliative care teams input throughout her stay.  8/25 to update multiple relatives on her course and prognosis.   Best practice:  Diet: tube feeding Pain/Anxiety/Delirium protocol (if indicated): as above VAP protocol (if indicated): yes DVT prophylaxis: SCDs , very low dose heparin GI prophylaxis: pantoprazole Glucose control:  insulin infusion  Mobility: bed rest Code Status: limited Family Communication: Husband and daughter updated in her room Disposition: ICU  This patient is critically ill with multiple organ system failure which requires frequent high complexity decision making, assessment, support, evaluation, and titration of therapies. This was completed through the application of advanced monitoring technologies and extensive interpretation of multiple databases. During this encounter critical care time was devoted to patient care services described in this note for 38 minutes.   Kipp Brood, MD 02/03/20 2:38 PM [facility]

## 2020-02-04 ENCOUNTER — Inpatient Hospital Stay (HOSPITAL_COMMUNITY): Payer: PRIVATE HEALTH INSURANCE

## 2020-02-04 LAB — POCT I-STAT 7, (LYTES, BLD GAS, ICA,H+H)
Acid-Base Excess: 1 mmol/L (ref 0.0–2.0)
Acid-Base Excess: 2 mmol/L (ref 0.0–2.0)
Acid-Base Excess: 3 mmol/L — ABNORMAL HIGH (ref 0.0–2.0)
Bicarbonate: 25.9 mmol/L (ref 20.0–28.0)
Bicarbonate: 26.2 mmol/L (ref 20.0–28.0)
Bicarbonate: 27.2 mmol/L (ref 20.0–28.0)
Calcium, Ion: 1.13 mmol/L — ABNORMAL LOW (ref 1.15–1.40)
Calcium, Ion: 1.07 mmol/L — ABNORMAL LOW (ref 1.15–1.40)
Calcium, Ion: 1.06 mmol/L — ABNORMAL LOW (ref 1.15–1.40)
HCT: 24 % — ABNORMAL LOW (ref 36.0–46.0)
HCT: 25 % — ABNORMAL LOW (ref 36.0–46.0)
HCT: 23 % — ABNORMAL LOW (ref 36.0–46.0)
Hemoglobin: 8.2 g/dL — ABNORMAL LOW (ref 12.0–15.0)
Hemoglobin: 8.5 g/dL — ABNORMAL LOW (ref 12.0–15.0)
Hemoglobin: 7.8 g/dL — ABNORMAL LOW (ref 12.0–15.0)
O2 Saturation: 89 %
O2 Saturation: 89 %
O2 Saturation: 97 %
Patient temperature: 36.7
Patient temperature: 36.3
Patient temperature: 36.7
Potassium: 4.4 mmol/L (ref 3.5–5.1)
Potassium: 4.8 mmol/L (ref 3.5–5.1)
Potassium: 4.6 mmol/L (ref 3.5–5.1)
Sodium: 138 mmol/L (ref 135–145)
Sodium: 139 mmol/L (ref 135–145)
Sodium: 137 mmol/L (ref 135–145)
TCO2: 27 mmol/L (ref 22–32)
TCO2: 27 mmol/L (ref 22–32)
TCO2: 28 mmol/L (ref 22–32)
pCO2 arterial: 40.7 mmHg (ref 32.0–48.0)
pCO2 arterial: 38 mmHg (ref 32.0–48.0)
pCO2 arterial: 36.2 mmHg (ref 32.0–48.0)
pH, Arterial: 7.41 (ref 7.350–7.450)
pH, Arterial: 7.444 (ref 7.350–7.450)
pH, Arterial: 7.483 — ABNORMAL HIGH (ref 7.350–7.450)
pO2, Arterial: 55 mmHg — ABNORMAL LOW (ref 83.0–108.0)
pO2, Arterial: 53 mmHg — ABNORMAL LOW (ref 83.0–108.0)
pO2, Arterial: 81 mmHg — ABNORMAL LOW (ref 83.0–108.0)

## 2020-02-04 LAB — BPAM RBC
Blood Product Expiration Date: 202109172359
Blood Product Expiration Date: 202109212359
Blood Product Expiration Date: 202109242359
Blood Product Expiration Date: 202109242359
ISSUE DATE / TIME: 202108270628
Unit Type and Rh: 5100
Unit Type and Rh: 5100
Unit Type and Rh: 5100
Unit Type and Rh: 5100

## 2020-02-04 LAB — GLUCOSE, CAPILLARY
Glucose-Capillary: 156 mg/dL — ABNORMAL HIGH (ref 70–99)
Glucose-Capillary: 188 mg/dL — ABNORMAL HIGH (ref 70–99)
Glucose-Capillary: 175 mg/dL — ABNORMAL HIGH (ref 70–99)
Glucose-Capillary: 158 mg/dL — ABNORMAL HIGH (ref 70–99)
Glucose-Capillary: 158 mg/dL — ABNORMAL HIGH (ref 70–99)
Glucose-Capillary: 187 mg/dL — ABNORMAL HIGH (ref 70–99)
Glucose-Capillary: 144 mg/dL — ABNORMAL HIGH (ref 70–99)
Glucose-Capillary: 154 mg/dL — ABNORMAL HIGH (ref 70–99)
Glucose-Capillary: 168 mg/dL — ABNORMAL HIGH (ref 70–99)
Glucose-Capillary: 159 mg/dL — ABNORMAL HIGH (ref 70–99)
Glucose-Capillary: 121 mg/dL — ABNORMAL HIGH (ref 70–99)
Glucose-Capillary: 182 mg/dL — ABNORMAL HIGH (ref 70–99)
Glucose-Capillary: 154 mg/dL — ABNORMAL HIGH (ref 70–99)
Glucose-Capillary: 134 mg/dL — ABNORMAL HIGH (ref 70–99)

## 2020-02-04 LAB — TYPE AND SCREEN
ABO/RH(D): O POS
Antibody Screen: NEGATIVE
Unit division: 0
Unit division: 0
Unit division: 0
Unit division: 0

## 2020-02-04 LAB — HEPATIC FUNCTION PANEL
ALT: 158 U/L — ABNORMAL HIGH (ref 0–44)
AST: 105 U/L — ABNORMAL HIGH (ref 15–41)
Albumin: 2.2 g/dL — ABNORMAL LOW (ref 3.5–5.0)
Alkaline Phosphatase: 243 U/L — ABNORMAL HIGH (ref 38–126)
Bilirubin, Direct: 0.7 mg/dL — ABNORMAL HIGH (ref 0.0–0.2)
Indirect Bilirubin: 1.1 mg/dL — ABNORMAL HIGH (ref 0.3–0.9)
Total Bilirubin: 1.8 mg/dL — ABNORMAL HIGH (ref 0.3–1.2)
Total Protein: 5.7 g/dL — ABNORMAL LOW (ref 6.5–8.1)

## 2020-02-04 LAB — CBC
HCT: 27.1 % — ABNORMAL LOW (ref 36.0–46.0)
HCT: 28.5 % — ABNORMAL LOW (ref 36.0–46.0)
Hemoglobin: 8.5 g/dL — ABNORMAL LOW (ref 12.0–15.0)
Hemoglobin: 8.9 g/dL — ABNORMAL LOW (ref 12.0–15.0)
MCH: 32.8 pg (ref 26.0–34.0)
MCH: 32.6 pg (ref 26.0–34.0)
MCHC: 31.4 g/dL (ref 30.0–36.0)
MCHC: 31.2 g/dL (ref 30.0–36.0)
MCV: 104.6 fL — ABNORMAL HIGH (ref 80.0–100.0)
MCV: 104.4 fL — ABNORMAL HIGH (ref 80.0–100.0)
Platelets: 61 10*3/uL — ABNORMAL LOW (ref 150–400)
Platelets: 72 10*3/uL — ABNORMAL LOW (ref 150–400)
RBC: 2.59 MIL/uL — ABNORMAL LOW (ref 3.87–5.11)
RBC: 2.73 MIL/uL — ABNORMAL LOW (ref 3.87–5.11)
RDW: 26 % — ABNORMAL HIGH (ref 11.5–15.5)
RDW: 26.4 % — ABNORMAL HIGH (ref 11.5–15.5)
WBC: 13.6 10*3/uL — ABNORMAL HIGH (ref 4.0–10.5)
WBC: 17.6 10*3/uL — ABNORMAL HIGH (ref 4.0–10.5)
nRBC: 15.2 % — ABNORMAL HIGH (ref 0.0–0.2)
nRBC: 14.7 % — ABNORMAL HIGH (ref 0.0–0.2)

## 2020-02-04 LAB — BASIC METABOLIC PANEL
Anion gap: 11 (ref 5–15)
Anion gap: 11 (ref 5–15)
BUN: 46 mg/dL — ABNORMAL HIGH (ref 6–20)
BUN: 46 mg/dL — ABNORMAL HIGH (ref 6–20)
CO2: 24 mmol/L (ref 22–32)
CO2: 24 mmol/L (ref 22–32)
Calcium: 7.9 mg/dL — ABNORMAL LOW (ref 8.9–10.3)
Calcium: 8.2 mg/dL — ABNORMAL LOW (ref 8.9–10.3)
Chloride: 102 mmol/L (ref 98–111)
Chloride: 101 mmol/L (ref 98–111)
Creatinine, Ser: 1.1 mg/dL — ABNORMAL HIGH (ref 0.44–1.00)
Creatinine, Ser: 1.01 mg/dL — ABNORMAL HIGH (ref 0.44–1.00)
GFR calc Af Amer: >60 mL/min (ref >60)
GFR calc Af Amer: >60 mL/min (ref >60)
GFR calc non Af Amer: 56 mL/min — ABNORMAL LOW (ref >60)
GFR calc non Af Amer: >60 mL/min (ref >60)
Glucose, Bld: 198 mg/dL — ABNORMAL HIGH (ref 70–99)
Glucose, Bld: 171 mg/dL — ABNORMAL HIGH (ref 70–99)
Potassium: 4.4 mmol/L (ref 3.5–5.1)
Potassium: 4.4 mmol/L (ref 3.5–5.1)
Sodium: 137 mmol/L (ref 135–145)
Sodium: 136 mmol/L (ref 135–145)

## 2020-02-04 LAB — FIBRINOGEN: Fibrinogen: 722 mg/dL — ABNORMAL HIGH (ref 210–475)

## 2020-02-04 LAB — PHOSPHORUS: Phosphorus: 3.2 mg/dL (ref 2.5–4.6)

## 2020-02-04 LAB — LACTATE DEHYDROGENASE: LDH: 1016 U/L — ABNORMAL HIGH (ref 98–192)

## 2020-02-04 LAB — HEPARIN LEVEL (UNFRACTIONATED)
Heparin Unfractionated: <0.1 IU/mL — ABNORMAL LOW (ref 0.30–0.70)
Heparin Unfractionated: <0.1 IU/mL — ABNORMAL LOW (ref 0.30–0.70)

## 2020-02-04 LAB — PROTIME-INR
INR: 1.1 (ref 0.8–1.2)
Prothrombin Time: 14 seconds (ref 11.4–15.2)

## 2020-02-04 LAB — MAGNESIUM: Magnesium: 2.8 mg/dL — ABNORMAL HIGH (ref 1.7–2.4)

## 2020-02-04 LAB — APTT
aPTT: 38 seconds — ABNORMAL HIGH (ref 24–36)
aPTT: 40 seconds — ABNORMAL HIGH (ref 24–36)

## 2020-02-04 LAB — LACTIC ACID, PLASMA
Lactic Acid, Venous: 1.5 mmol/L (ref 0.5–1.9)
Lactic Acid, Venous: 1.6 mmol/L (ref 0.5–1.9)

## 2020-02-04 NOTE — Progress Notes (Signed)
ANTICOAGULATION CONSULT NOTE - [place] for Heparin Indication: ECMO  Allergies  Allergen Reactions  . Sulfa Antibiotics Rash and Other (See Comments)    Patient Measurements: Height: 5\' 4"  (162.6 cm) Weight: 126.4 kg (278 lb 10.6 oz) IBW/kg (Calculated) : 54.7 Heparin Dosing Weight: 87 kg  Vital Signs: Temp: 97.3 F (36.3 C) (08/29 0900) Temp Source: Esophageal (08/29 0800) Pulse Rate: 97 (08/29 0900)  Labs: Recent Labs    02/02/20 0330 02/02/20 0436 02/02/20 0832 02/02/20 0849 02/03/20 0313 02/03/20 0437 02/03/20 1630 02/03/20 1641 02/04/20 0350 02/04/20 0350 02/04/20 0404 02/04/20 0406 02/04/20 0836  HGB 7.7*   < >  --    < > 8.5*   < > 8.2*   < > 8.2*   < > 8.5*  --  8.5*  HCT 24.4*   < >  --    < > 27.1*   < > 25.9*   < > 24.0*  --  27.1*  --  25.0*  PLT 61*  --   --    < > 61*  --  61*  --   --   --  61*  --   --   APTT 43*  --   --    < > 40*  --  40*  --   --   --  38*  --   --   LABPROT 15.3*  --   --   --  13.9  --   --   --   --   --  14.0  --   --   INR 1.3*  --   --   --  1.1  --   --   --   --   --  1.1  --   --   HEPARINUNFRC  --   --  <0.10*  --  <0.10*  --   --   --   --   --   --  <0.10*  --   CREATININE 1.10*  --   --    < > 1.15*  --  1.06*  --   --   --  1.10*  --   --    < > = values in this interval not displayed.    Estimated Creatinine Clearance: 76.1 mL/min (A) (by C-G formula based on SCr of 1.1 mg/dL (H)).   Medical History: Past Medical History:  Diagnosis Date  . Asthma   . Diabetes mellitus without complication ([place])   . Diverticulitis   . Gallstones   . IBS (irritable bowel syndrome)   . NAFLD (nonalcoholic fatty liver disease)    CT scan 2015  . Ovarian cyst     Assessment: 54 yo female who is COVID+ on VV ECMO. Patient was started on bivalirudin but was found to have multiple ICH on 8/13 head CT. Bivalirudin was stopped at this time and pt has been off anticoagulation. Her ECMO circuit was  changed 8/24, and pharmacy asked to start low-fixed rate heparin.  Heparin level remains undetectable on fixed, low-dose heparin 400 units/hr. LDH 1288 > 1016. Fibrinogen also up to 722. Hgb 8.5 (s/p 1u PRBC), plts up slightly 61. No bleeding per discussion with RN. ECMO circuit is stable.  PTT this AM down to 38.   Goal of Therapy:  Heparin level 0.2-0.5 units/ml - currently not titrating to goal per discussion with ECMO team Monitor platelets by anticoagulation protocol: Yes   Plan:  Discussed with  Dr. Aundra Dubin, would like for aPTT to be a little higher, will increase fixed-rate IV heparin to 500 units/hr. Repeat aPTT/Heparin level with 5 pm labs. Check daily heparin level and aPTT Monitor daily CBC, signs of bleeding, and ECMO circuit  [place] Crane, Roylene Reason, [facility] Clinical Pharmacist  02/04/2020 9:21 AM   [facility] pharmacy phone numbers are listed on [email or web address]  Please check [email or web address] for unit-specific pharmacy phone numbers.

## 2020-02-04 NOTE — Progress Notes (Signed)
Patient ID: Alisha Stephenson, female   DOB: 1965-11-06, 54 y.o.   MRN: [id number]    Advanced Heart Failure Rounding Note   Subjective:    - 8/2 COVID + test - 8/9 Cannulated for VV ECMO - 8/13 with several areas of intracranial hemorrhage. Bival stopped.  - 8/14 CT no change in [place]. Increased edema - 8/16 Extubated - 8/16 Head CT stable bleed - 8/22 CVVHD started - 8/24 reintubated - 8/24 circuit changed  Remains awake on vent. ?Follows some commands per nursing.   Remains on CVVHD, pulling 100 cc/hr net negative UF with I/O 3.4 L negative. Remains anuric  Heparin in circuit at 400u/hr. Hgb up to 8.5. PTT 38 today.  LDH remains high but coming down. PLTs and fibrinogen climbing with new circuit.   Eraxis for fungal coverage, Zosyn for Enterococcal bacteremina.   Currently off pressors.  Remains on milrinone 0.125 for RV.   ECMO   Flow 5.10 L RPM 4200 Sweep14  Labs:  7.41/41/55/89% Hgb8.5 PLT 32k -> 46k -> 61 -> 61 LDH 429 -> 451 -> 576 -> 527 -> 547 -> 655 -> 917 -> 1,141 -> 1250 -> 1,288 -> 1130 -> 1016 Lactic acid1.3 -> 1.9 -> 2.3 -> 1.5 Fibrinogen 176 -> 316 -> 521 -> 611 -> 722  Objective:   Weight Range:  Vital Signs:   Temp:  [95 F (35 C)-99.3 F (37.4 C)] 97.5 F (36.4 C) (08/29 0800) Pulse Rate:  [80-108] 102 (08/29 0800) Resp:  [15] 15 (08/29 0101) SpO2:  [86 %-100 %] 90 % (08/29 0800) Arterial Line BP: (78-176)/(35-103) 141/56 (08/29 0800) FiO2 (%):  [40 %-60 %] 40 % (08/29 0800) Last BM Date: 02/03/20  Weight change: Filed Weights   02/01/20 0500 02/02/20 0500 02/03/20 0500  Weight: 133.5 kg 130.9 kg 126.4 kg    Intake/Output:   Intake/Output Summary (Last 24 hours) at 02/04/2020 0806 Last data filed at 02/04/2020 0800 Gross per 24 hour  Intake 3647.33 ml  Output 6852 ml  Net -3204.67 ml     Physical Exam: General: Will awaken. On vent.  Neck: No JVD, no thyromegaly or thyroid nodule.  Lungs: Decreased bilaterally.  CV:  Nondisplaced PMI.  Heart regular S1/S2, no S3/S4, no murmur.  Trace ankle edema.  Abdomen: Soft, nontender, no hepatosplenomegaly, no distention.  Skin: Intact without lesions or rashes.  Neurologic: Will awaken Extremities: No clubbing or cyanosis.  HEENT: Normal.   Telemetry: Sinus 90s Personally reviewed   Labs: Basic Metabolic Panel: Recent Labs  Lab 01/31/20 0300 01/31/20 0448 02/01/20 0226 02/01/20 0017 02/02/20 0330 02/02/20 0436 02/02/20 1611 02/02/20 1939 02/03/20 0313 02/03/20 0437 02/03/20 1630 02/03/20 1641 02/03/20 2008 02/04/20 0350 02/04/20 0404  NA 134*   < > 137   < > 135   < > 136   < > 137   < > 136 136 138 138 137  K 3.9   < > 4.3   < > 4.2   < > 4.1   < > 3.6   < > 5.9* 6.5* 5.3* 4.4 4.4  CL 101   < > 101   < > 100  --  101  --  102  --  101  --   --   --  102  CO2 22   < > 23   < > 24  --  23  --  24  --  23  --   --   --  24  GLUCOSE 278*   < > 207*   < > 182*  --  224*  --  217*  --  177*  --   --   --  198*  BUN 73*   < > 50*   < > 42*  --  49*  --  47*  --  45*  --   --   --  46*  CREATININE 1.26*   < > 1.05*   < > 1.10*  --  1.20*  --  1.15*  --  1.06*  --   --   --  1.10*  CALCIUM 7.1*   < > 7.3*   < > 7.7*   < > 7.6*   < > 7.8*  --  8.0*  --   --   --  7.9*  MG 2.6*  --  2.7*  --  2.8*  --   --   --  2.6*  --   --   --   --   --  2.8*  PHOS 6.1*  --  4.4  --  3.7  --   --   --  2.6  --   --   --   --   --  3.2   < > = values in this interval not displayed.    Liver Function Tests: Recent Labs  Lab 01/31/20 0300 02/01/20 0226 02/02/20 0330 02/03/20 0313 02/04/20 0404  AST 206* 245* 195* 162* 105*  ALT 88* 118* 134* 165* 158*  ALKPHOS 155* 204* 259* 252* 243*  BILITOT 1.6* 1.9* 1.9* 1.6* 1.8*  PROT 4.9* 4.9* 5.4* 5.7* 5.7*  ALBUMIN 2.4* 2.3* 2.4* 2.3* 2.2*   No results for input(s): LIPASE, AMYLASE in the last 168 hours. No results for input(s): AMMONIA in the last 168 hours.  CBC: Recent Labs  Lab 02/02/20 1611 02/02/20 1939  02/02/20 1948 02/02/20 1948 02/03/20 0313 02/03/20 0437 02/03/20 1630 02/03/20 1641 02/03/20 2008 02/04/20 0350 02/04/20 0404  WBC 16.8*  --  14.0*  --  16.3*  --  13.6*  --   --   --  13.6*  HGB 8.4*   < > 8.1*   < > 8.5*   < > 8.2* 8.8* 8.5* 8.2* 8.5*  HCT 26.5*   < > 24.9*   < > 27.1*   < > 25.9* 26.0* 25.0* 24.0* 27.1*  MCV 100.4*  --  100.8*  --  101.9*  --  102.8*  --   --   --  104.6*  PLT 57*  --  50*  --  61*  --  61*  --   --   --  61*   < > = values in this interval not displayed.    Cardiac Enzymes: No results for input(s): CKTOTAL, CKMB, CKMBINDEX, TROPONINI in the last 168 hours.  BNP: BNP (last 3 results) No results for input(s): BNP in the last 8760 hours.  ProBNP (last 3 results) No results for input(s): PROBNP in the last 8760 hours.    Other results:  Imaging: DG CHEST PORT 1 VIEW  Result Date: 02/03/2020 CLINICAL DATA:  COVID positive with respiratory distress. EXAM: PORTABLE CHEST 1 VIEW COMPARISON:  February 02, 2020 FINDINGS: There is stable endotracheal tube, nasogastric tube and left internal jugular venous catheter positioning. Marked severity opacification of both lungs is seen with a very mild amount of aerated perihilar regions, bilaterally. This is very mildly improved when compared to the prior study. The heart size  and mediastinal contours are subsequently limited in evaluation. A radiopaque fusion plate and screws are seen overlying the cervical spine. The visualized skeletal structures are otherwise unremarkable. IMPRESSION: Marked severity opacification of both lungs with a very mild amount of aerated perihilar regions, bilaterally. Electronically Signed   By: Virgina Norfolk M.D.   On: 02/03/2020 15:04     Medications:     Scheduled Medications: . sodium chloride   Intravenous Once  . sodium chloride   Intravenous Once  . vitamin C  500 mg Per Tube Daily  . B-complex with vitamin C  1 tablet Oral Daily  . chlorhexidine gluconate  (MEDLINE KIT)  15 mL Mouth Rinse BID  . Chlorhexidine Gluconate Cloth  6 each Topical Daily  . clonazePAM  1 mg Per Tube Q6H  . docusate  100 mg Per Tube BID  . feeding supplement (PROSource TF)  45 mL Per Tube TID  . fentaNYL (SUBLIMAZE) injection  50 mcg Intravenous Once  . linagliptin  5 mg Per Tube Daily  . mouth rinse  15 mL Mouth Rinse 10 times per day  . oxyCODONE  10 mg Per Tube Q6H  . pantoprazole (PROTONIX) IV  40 mg Intravenous Daily  . polyethylene glycol  17 g Per Tube Daily  . sennosides  5 mL Per Tube BID  . zinc sulfate  220 mg Per Tube Daily    Infusions: .  prismasol BGK 4/2.5 300 mL/hr at 02/04/20 0234  . sodium chloride 500 mL (02/01/20 1958)  . albumin human 12.5 g (01/28/20 1229)  . albumin human    . amiodarone 30 mg/hr (02/04/20 0800)  . anidulafungin Stopped (02/03/20 1830)  . dexmedetomidine (PRECEDEX) IV infusion 0.9 mcg/kg/hr (02/04/20 0800)  . feeding supplement (PIVOT 1.5 CAL) 60 mL/hr at 02/04/20 0700  . heparin 500 Units/hr (02/04/20 0800)  . HYDROmorphone 2 mg/hr (02/04/20 0800)  . insulin 8 mL/hr at 02/04/20 0800  . milrinone 0.125 mcg/kg/min (02/04/20 0800)  . norepinephrine (LEVOPHED) Adult infusion Stopped (02/03/20 1405)  . piperacillin-tazobactam (ZOSYN)  IV Stopped (02/04/20 0749)  . prismasol BGK 2/2.5 replacement solution 500 mL/hr at 02/04/20 0231  . prismasol BGK 4/2.5 2,000 mL/hr at 02/04/20 0430  . vasopressin Stopped (02/04/20 0304)    PRN Medications: sodium chloride, acetaminophen (TYLENOL) oral liquid 160 mg/5 mL, albumin human, albuterol, dextrose, fentaNYL, fentaNYL (SUBLIMAZE) injection, guaiFENesin-dextromethorphan, heparin, hydrALAZINE, HYDROmorphone, hydrOXYzine, ondansetron **OR** ondansetron (ZOFRAN) IV, sodium chloride   Assessment/Plan:   1. Acute hypoxic/hypercapneic respiratory failure in setting of severe COVID PNA/ARDS -> VV ECMO - admit 8/2 - intubation 8/3 - has received actmera (compelted 8/2), remdesivir  (completed 8/6) and steroids - failed full vent support with proning/paralytic -> Cannulated for VV ECMO on 8/9 - Extubated 8/16. Reintubated 8/24 - CXR unchanged -> diffuse infiltrates. Personally reviewed - Circuit changed 8/24 due to concern for hemolysis and worsening oxygenation. Oxygenation improved. LDH trending down and PLTs and fibrinogen trending up.  - CVVHD started 8/22 for volume removal and uremia. UF net 100 cc/hr currently, will decrease to net negative 50 cc/hr today. Remains anuric. Renal following.  - Still with multi-system organ failure (brain, lungs, kidneys, liver, vascular injury).  Prognosis poor but seems to be following commands now. Palliative Care following  2. Enterococcus sepsis - back on high-dose zosyn per ID - Eraxis added on 8/26 with yeast in BAL 8/24 - Now off pressors.   3. Intracranial hemorrhage - ? Septic emboli - repeat head CT on 8/14 with stable  bleeds but increased edema - neurology has seen. Suspect significant long-term injury sustained. Will follow commands. Appears to have dense LUE weakness and possibly LLE - repeat head CT stable 8/16 - Now back on low-dose heparin to protect circuit from further clotting. With plts higher and PTT 38, will increase heparin gtt to 500 cc/hr (discussed with pharmacy).  - Consider TEE at some point to further evalaute. Can consider now that she is re-intubated but won't change management currently  4. Thrombocytopenia - PLTs up to 61K today after circuit change - Continue to follow  5. Morbid obesity - Body mass index is Body mass index is 47.83 kg/m.  6. Poorly controlled DM2 - HgBA1c 10.7 - CBGs remain elevated - On IV insulin adjust as needed  6. Hypernatremia - Resolved with HD.   7. Lactic acidosis -  Normal at 1.5 today. MAP stable, ABG improving.  Will follow.   8. PAF - intermittent episodes. Last 8/26 - In NSR on IV amio this am. Continue   9. AKI/azotemia - CVVHD started on  8/22 - Net negative yesterday, continue gentle UF.   10. Ischemic R fingers - ? Due to septic embolic versus vascular issue - VVS following.   11. Acute liver failure - has underlying fatty liver - mix of conjugated/unconjugated   CRITICAL CARE Performed by: Loralie Champagne  Total critical care time: 40 minutes  Critical care time was exclusive of separately billable procedures and treating other patients.  Critical care was necessary to treat or prevent imminent or life-threatening deterioration.  Critical care was time spent personally by me (independent of midlevel providers or residents) on the following activities: development of treatment plan with patient and/or surrogate as well as nursing, discussions with consultants, evaluation of patient's response to treatment, examination of patient, obtaining history from patient or surrogate, ordering and performing treatments and interventions, ordering and review of laboratory studies, ordering and review of radiographic studies, pulse oximetry and re-evaluation of patient's condition.   Length of Stay: 28   Loralie Champagne  MD 02/04/2020, 8:06 AM  Advanced Heart Failure Team Pager [phone] (M-F; 7a - 4p)  Please contact [facility] for night-coverage after hours (4p -7a ) and weekends on [email or web address]

## 2020-02-04 NOTE — Progress Notes (Signed)
NAME:  SHAMON LOBO, MRN:  [id number], DOB:  [birth year], LOS: 61 ADMISSION DATE:  01/16/2020, CONSULTATION DATE:  8/3 REFERRING MD:  Alfredia Ferguson, CHIEF COMPLAINT:  Dyspnea   Brief History   54 y/o female admitted on 8/2 with severe acute respiratory failure with hypoxemia due to COVID 19 pneumonia.  She developed symptoms 1 week prior to admission.  Past Medical History  DM2 Diverticulitis Gallstones Ovarian cyst NAFLD Asthma  Significant Hospital Events   8/2 admit 8/3 ICU transfer, intubated 8/4 prone, paralyze 8/9 significant desaturations today 8/9 VV ECMO cannulation 8/13 ICH  Consults:  PCCM ECMO team   Procedures:  8/3 ETT >  8/3 PICC >  8/9 LIJ MML 8/9 RIJ [place] 103F   Significant Diagnostic Tests:  7/31 CT head > NAICP 7/31 MRI/MRA brain > no acute changes, possibly small aneurysm ACOM 8/13 CT head> multiple areas of ICH  Micro Data:  8/2 blood > NG 8/2 SARS COV 2 > positive 8/4 resp > negative 8/4 urine >  8/12 blood > E. faecalis (pan-sensitive) 8/12 resp: staph aureus> MSSA 8/25 blood>> 10K yeast 8/24 BAL> yeast  Antimicrobials:  8/2 remdesivir > 8/6 8/2 actemra  8/2 solumedrol >   8/3 ceftriaxone >  8/5 8/3 azithro >  8/5  8/12 meropenem>8/16 8/12 vanc>8/16  8/13 zosyn >> 8/20 , restart 8/23 Vanc 8/19>> 8/22 Fluconazole 8/19>> 8/22 Amp & ceftriaxone  8/22-8/23   Interim history/subjective:  No bleeding overnight.  Awake and following commands.  Objective   Blood pressure (!) 196/66, pulse 97, temperature 98.1 F (36.7 C), resp. rate (!) 29, height _0  (1.626 m), weight 126.4 kg, SpO2 96 %. CVP:  [13 mmHg-18 mmHg] 15 mmHg  Vent Mode: PCV FiO2 (%):  [40 %] 40 % Set Rate:  [15 bmp-145 bmp] 15 bmp Vt Set:  [350 mL] 350 mL PEEP:  [12 cmH20] 12 cmH20 Plateau Pressure:  [23 cmH20-24 cmH20] 24 cmH20   Intake/Output Summary (Last 24 hours) at 02/04/2020 1851 Last data filed at 02/04/2020 1800 Gross per 24 hour  Intake 3121.7 ml   Output 5860 ml  Net -2738.3 ml   Filed Weights   02/01/20 0500 02/02/20 0500 02/03/20 0500  Weight: 133.5 kg 130.9 kg 126.4 kg    Examination:  GEN: Critically ill-appearing woman lying in bed intubated, lightly sedated, on ECMO HEENT: [place]/AT, scleral edema with mild icterus CV: Regular rate and rhythm, no murmurs PULM: breathing synchronously with the vent, not breathing above the set rate, breath sounds at apices. GI: Obese, soft, nontender EXT: Ongoing edema in hands, feet.  Well demarcated cyanosis in digits on right hand, bruising over her knuckles and hand on the left, but not in a circumferential pattern as the right.  Wound on distal right toe associated with ingrown toenail-not erythematous.  Mild cyanosis of feet NEURO: RASS -2, tracking, nodding to answer questions sometimes.  Very weak globally.  Not moving left upper extremity.   Resolved Hospital Problem list     Assessment & Plan:     Critically ill due to Acute hypoxic and AoC hypercapneic respiratory failure; likely underly OHS requiring VV ECMO support and mechanical ventilation.  ARDS due to COVID 19 pneumonia   MSSA pneumonia Possibly fungal infection vs contamination. Clinically consistent with infection causing sepsis. Enterococcal bacteremia-recent blood cultures cleared Concern for possible embolic phenomenon- fingers and possibly CVAs were embolic  Elevated LDH, hypo-fibrinogenemia and thrombocytopenia ICH, multifocal. L-sided weakness.  Encephalopathy, improved Acute renal failure, severe uremia.  Anuric.Marland Kitchen Shock related to  RV stunning- milrinone  Septic shock- Enterococcus bacteremias Hyperglycemia > not controlled at all with  insulin despite aggressive upward titration.  Assume poor absorption, potentially due to subcutaneous edema.   Remains elevated on IV.  Plan:  Improving oxygenation with fluid removal. Continue to slowly wean sedation to allow for greater patient participation in  care Trach this week once PLT >80.    Best practice:  Diet: tube feeding Pain/Anxiety/Delirium protocol (if indicated): as above VAP protocol (if indicated): yes DVT prophylaxis: SCDs , very low dose heparin GI prophylaxis: pantoprazole Glucose control:  insulin infusion  Mobility: bed rest Code Status: limited Family Communication: Husband  updated in her room Disposition: ICU  This patient is critically ill with multiple organ system failure which requires frequent high complexity decision making, assessment, support, evaluation, and titration of therapies. This was completed through the application of advanced monitoring technologies and extensive interpretation of multiple databases. During this encounter critical care time was devoted to patient care services described in this note for 67mnutes.   RKipp Brood MD 02/04/20 6:51 PM [facility]

## 2020-02-04 NOTE — Progress Notes (Signed)
Pharmacy Antibiotic Note  Alisha Stephenson is a 54 y.o. female admitted on 02/05/2020 with COVID pneumonia now s/p VV ECMO cannulation on 01/14/2020.    Found to have staph aureus (MSSA) + enterococcus faecalis (pan-sen). Patient has been on high-dose Zosyn given pharmacokinetic properties with ECMO. Now with concern of endocarditis. BAL from 8/24 growing candida. Pharmacy started Eraxis.   WBC still elevated 19 > 13.6, she remains on CRRT and hypothermic on ECMO.  Plan: Continue Zosyn 4.5g IV q6 hrs Continue Eraxis 154m IV q24 hrs Monitor clinical progression, cultures, CRRT duration   Height: _0  (162.6 cm) Weight: 126.4 kg (278 lb 10.6 oz) IBW/kg (Calculated) : 54.7  Temp (24hrs), Avg:98.3 F (36.8 C), Min:95 F (35 C), Max:99.3 F (37.4 C)  Recent Labs  Lab 02/02/20 0330 02/02/20 0330 02/02/20 1600 02/02/20 1611 02/02/20 1948 02/03/20 0313 02/03/20 1630 02/03/20 2200 02/04/20 0404 02/04/20 0406  WBC 20.2*   < >  --  16.8* 14.0* 16.3* 13.6*  --  13.6*  --   CREATININE 1.10*  --   --  1.20*  --  1.15* 1.06*  --  1.10*  --   LATICACIDVEN 1.9  --  2.3*  --   --  2.3*  --  1.4  --  1.5   < > = values in this interval not displayed.    Estimated Creatinine Clearance: 76.1 mL/min (A) (by C-G formula based on SCr of 1.1 mg/dL (H)).    Allergies  Allergen Reactions  . Sulfa Antibiotics Rash and Other (See Comments)   Antimicrobials this admission: 8/3 CTX >> 8/6; 8/22 >>8/23 8/3 zithromax >> 8/6 8/2 Remdesivir >> 8/6 8/2 Actemra 8/2 solu-medrol 60 Q12>> 8/6 decr to 40/d > 8/12 Vanc >>8/15; 8/19 >> 8/22 8/12 Meropenem >> 8/13 8/13 Zosyn high dose  >> 8/22; 8/23 >> 8/19 Fluconazole >> 8/22 8/22 ampicillin >> 8/23 8/26 Eraxis >>  Microbiology Results:  8/25 Bcx: ngtd 8/24 BAL: abundant yeast- candida dubliniensis 8/19 Bcx: neg 8/15 Bcx: neg 8/12 Bcx: enterococcus amp S 8/12 TA: abundant MSSA 8/2 BCx: NGF 8/4 UCx: ngf 8/4 Sputum: normal flora 8/3 MRSA PCR:  neg 8/2 COVID: positive  JNevada Crane PVena Austria BCPS, BCCP Clinical Pharmacist  02/04/2020 9:18 AM   [facility] phone numbers are listed on [email or web address]  Please check [email or web address] for unit-specific pharmacy phone numbers.

## 2020-02-04 NOTE — Procedures (Signed)
Extracorporeal support note   ECLS support day: 20 Indication: Covid 19 pneumonia/ARDS  Configuration: VV  Drainage cannula: right IJ [place]  Return cannula: same  Pump speed: 4200 Pump flow: 5.07 Pump used: Centrimag  Oxygenator: Quadrox O2 blender: 100% Sweep gas: 14  Circuit check: no visible clot Anticoagulant: heparin  Anticoagulation targets: 40-60  Changes in support: no change in support   Anticipated goals/duration of support: bridge to recovery.   Alisha Brood, MD [facility] ICU Physician [place]  Pager: [phone] Mobile: [phone] After hours: [phone].  02/04/2020, 6:46 PM

## 2020-02-04 NOTE — Progress Notes (Signed)
ANTICOAGULATION CONSULT NOTE - [place] for Heparin Indication: ECMO  Allergies  Allergen Reactions  . Sulfa Antibiotics Rash and Other (See Comments)    Patient Measurements: Height: 5\' 4"  (162.6 cm) Weight: 126.4 kg (278 lb 10.6 oz) IBW/kg (Calculated) : 54.7 Heparin Dosing Weight: 87 kg  Vital Signs: Temp: 98.2 F (36.8 C) (08/29 1600) Temp Source: Esophageal (08/29 1600) BP: 196/66 (08/29 1533) Pulse Rate: 104 (08/29 1600)  Labs: Recent Labs    02/02/20 0330 02/02/20 0436 02/03/20 0313 02/03/20 0437 02/03/20 1630 02/03/20 1641 02/04/20 0350 02/04/20 0350 02/04/20 0404 02/04/20 0406 02/04/20 0836 02/04/20 1548  HGB 7.7*   < > 8.5*   < > 8.2*   < > 8.2*   < > 8.5*  --  8.5*  --   HCT 24.4*   < > 27.1*   < > 25.9*   < > 24.0*  --  27.1*  --  25.0*  --   PLT 61*   < > 61*  --  61*  --   --   --  61*  --   --   --   APTT 43*   < > 40*   < > 40*  --   --   --  38*  --   --  40*  LABPROT 15.3*  --  13.9  --   --   --   --   --  14.0  --   --   --   INR 1.3*  --  1.1  --   --   --   --   --  1.1  --   --   --   HEPARINUNFRC  --    < > <0.10*  --   --   --   --   --   --  <0.10*  --  <0.10*  CREATININE 1.10*   < > 1.15*   < > 1.06*  --   --   --  1.10*  --   --  1.01*   < > = values in this interval not displayed.    Estimated Creatinine Clearance: 82.9 mL/min (A) (by C-G formula based on SCr of 1.01 mg/dL (H)).   Medical History: Past Medical History:  Diagnosis Date  . Asthma   . Diabetes mellitus without complication ([place])   . Diverticulitis   . Gallstones   . IBS (irritable bowel syndrome)   . NAFLD (nonalcoholic fatty liver disease)    CT scan 2015  . Ovarian cyst     Assessment: 54 yo female who is COVID+ on VV ECMO. Patient was started on bivalirudin but was found to have multiple ICH on 8/13 head CT. Bivalirudin was stopped at this time and pt has been off anticoagulation. Her ECMO circuit was changed 8/24, and pharmacy  asked to start low-fixed rate heparin.  Heparin level remains undetectable on fixed, aPTT came back subtherapeutic at 40, on low-dose heparin 500 units/hr. LDH 1288 > 1016. Fibrinogen also up to 722. Hgb 8.5 (s/p 1u PRBC), plts up slightly 61 this morning. No bleeding or infusion issues per discussion with RN. ECMO circuit is stable.  Goal of Therapy:  Heparin level 0.2-0.5 units/ml - currently not titrating to goal per discussion with ECMO team Monitor platelets by anticoagulation protocol: Yes   Plan:  Will continue heparin infusion at 500 units/hr - will discuss further increase with ECMO team in morning Repeat aPTT/Heparin level with 5 am  labs. Check daily heparin level and aPTT Monitor daily CBC, signs of bleeding, and ECMO circuit  Antonietta Jewel, PharmD, BCCCP Clinical Pharmacist  Phone: [phone] 02/04/2020 4:45 PM  Please check AMION for all [place] phone numbers After 10:00 PM, call [place] [phone]

## 2020-02-04 NOTE — Progress Notes (Signed)
[facility] NEPHROLOGY PROGRESS NOTE  Assessment/ Plan: Pt is a 54 y.o. yo female  With PMH of DM2, obesity, NAFLD who present w/ acute hypoxic respiratory failure due to Covid.  Hospitalization complicated by severe shock, refractory hypoxia requiring ECMO, multiple infections, intracranial hemorrhage, encephalopathy, AKI.  # Anuric AKI due to shock, sepsis causing acute tubular necrosis: Started CRRT on 8/22 for uremia and volume overload.  She has ECMO. Hyperkalemic last night therefore changed PD filter to 2K last night.  Potassium level has improved.  Tolerating UF well.  IV heparin for anticoagulation. Continue CRRT, prognosis grim.  #Shock due to sepsis, Covid infection, Enterococcus bacteremia: On pressors, antibiotics per primary team.  #Acute hypoxic and hypercapnic respiratory failure, ARDS due to COVID-19 pneumonia: Currently on ECMO for refractory hypoxemia, MSSA pneumonia.  On broad-spectrum antibiotics.   #Hypernatremia, hypervolemia: relative free water deficit.  Improved with CRRT.  #Acute toxic metabolic encephalopathy: Multifactorial with uremia contributing.  Treatment with CRRT as above.  #Anemia, thrombocytopenia: With hemolysis likely and acute illness contributing.  Transfusions per primary team.  Seen by hematologist.  #Intracranial hemorrhage: With some left-sided weakness preceded by primary team.  Avoiding heparin products as able.  #GOC: Multiorgan failure.  Had family meeting on 8/25, continue full scope of treatment.  Long-term prognosis poor.  Subjective: Seen and examined.  Remains anuric.  Tolerating UF with CRRT.  Changed CRRT prescription last night.  No new event.   Objective  Vital signs in last 24 hours: Vitals:   02/04/20 0730 02/04/20 0800 02/04/20 0830 02/04/20 0900  BP:      Pulse: 98 (!) 102 95 97  Resp:      Temp: 97.9 F (36.6 C) (!) 97.5 F (36.4 C) (!) 97.3 F (36.3 C) (!) 97.3 F (36.3 C)  TempSrc:  Esophageal     SpO2: 94% 90% 95% (!) 89%  Weight:      Height:       Weight change:   Intake/Output Summary (Last 24 hours) at 02/04/2020 0935 Last data filed at 02/04/2020 0900 Gross per 24 hour  Intake 3460.19 ml  Output 6542 ml  Net -3081.81 ml       Labs: Basic Metabolic Panel: Recent Labs  Lab 02/02/20 0330 02/02/20 0436 02/03/20 0313 02/03/20 0437 02/03/20 1630 02/03/20 1641 02/04/20 0350 02/04/20 0404 02/04/20 0836  NA 135   < > 137   < > 136   < > 138 137 139  K 4.2   < > 3.6   < > 5.9*   < > 4.4 4.4 4.8  CL 100   < > 102  --  101  --   --  102  --   CO2 24   < > 24  --  23  --   --  24  --   GLUCOSE 182*   < > 217*  --  177*  --   --  198*  --   BUN 42*   < > 47*  --  45*  --   --  46*  --   CREATININE 1.10*   < > 1.15*  --  1.06*  --   --  1.10*  --   CALCIUM 7.7*   < > 7.8*  --  8.0*  --   --  7.9*  --   PHOS 3.7  --  2.6  --   --   --   --  3.2  --    < > =  values in this interval not displayed.   Liver Function Tests: Recent Labs  Lab 02/02/20 0330 02/03/20 0313 02/04/20 0404  AST 195* 162* 105*  ALT 134* 165* 158*  ALKPHOS 259* 252* 243*  BILITOT 1.9* 1.6* 1.8*  PROT 5.4* 5.7* 5.7*  ALBUMIN 2.4* 2.3* 2.2*   No results for input(s): LIPASE, AMYLASE in the last 168 hours. No results for input(s): AMMONIA in the last 168 hours. CBC: Recent Labs  Lab 02/02/20 1611 02/02/20 1939 02/02/20 1948 02/02/20 1948 02/03/20 0313 02/03/20 0437 02/03/20 1630 02/03/20 1641 02/04/20 0350 02/04/20 0404 02/04/20 0836  WBC 16.8*   < > 14.0*   < > 16.3*  --  13.6*  --   --  13.6*  --   HGB 8.4*   < > 8.1*   < > 8.5*   < > 8.2*   < > 8.2* 8.5* 8.5*  HCT 26.5*   < > 24.9*   < > 27.1*   < > 25.9*   < > 24.0* 27.1* 25.0*  MCV 100.4*  --  100.8*  --  101.9*  --  102.8*  --   --  104.6*  --   PLT 57*   < > 50*   < > 61*  --  61*  --   --  61*  --    < > = values in this interval not displayed.   Cardiac Enzymes: No results for input(s): CKTOTAL, CKMB, CKMBINDEX,  TROPONINI in the last 168 hours. CBG: Recent Labs  Lab 02/04/20 0348 02/04/20 0436 02/04/20 0537 02/04/20 0711 02/04/20 0914  GLUCAP 188* 175* 158* 158* 187*    Iron Studies: No results for input(s): IRON, TIBC, TRANSFERRIN, FERRITIN in the last 72 hours. Studies/Results: DG CHEST PORT 1 VIEW  Result Date: 02/03/2020 CLINICAL DATA:  COVID positive with respiratory distress. EXAM: PORTABLE CHEST 1 VIEW COMPARISON:  February 02, 2020 FINDINGS: There is stable endotracheal tube, nasogastric tube and left internal jugular venous catheter positioning. Marked severity opacification of both lungs is seen with a very mild amount of aerated perihilar regions, bilaterally. This is very mildly improved when compared to the prior study. The heart size and mediastinal contours are subsequently limited in evaluation. A radiopaque fusion plate and screws are seen overlying the cervical spine. The visualized skeletal structures are otherwise unremarkable. IMPRESSION: Marked severity opacification of both lungs with a very mild amount of aerated perihilar regions, bilaterally. Electronically Signed   By: Virgina Norfolk M.D.   On: 02/03/2020 15:04    Medications: Infusions: .  prismasol BGK 4/2.5 300 mL/hr at 02/04/20 0234  . sodium chloride 500 mL (02/01/20 1958)  . albumin human 12.5 g (01/28/20 1229)  . albumin human    . amiodarone 30 mg/hr (02/04/20 0900)  . anidulafungin Stopped (02/03/20 1830)  . dexmedetomidine (PRECEDEX) IV infusion 0.9 mcg/kg/hr (02/04/20 0900)  . feeding supplement (PIVOT 1.5 CAL) 60 mL/hr at 02/04/20 0700  . heparin 500 Units/hr (02/04/20 0900)  . HYDROmorphone 2 mg/hr (02/04/20 0900)  . insulin 8 mL/hr at 02/04/20 0900  . milrinone 0.125 mcg/kg/min (02/04/20 0900)  . norepinephrine (LEVOPHED) Adult infusion Stopped (02/03/20 1405)  . piperacillin-tazobactam (ZOSYN)  IV Stopped (02/04/20 0749)  . prismasol BGK 2/2.5 replacement solution 500 mL/hr at 02/04/20 0231  .  prismasol BGK 4/2.5 2,000 mL/hr at 02/04/20 0815  . vasopressin Stopped (02/04/20 0304)    Scheduled Medications: . sodium chloride   Intravenous Once  . sodium chloride   Intravenous Once  .  vitamin C  500 mg Per Tube Daily  . B-complex with vitamin C  1 tablet Oral Daily  . chlorhexidine gluconate (MEDLINE KIT)  15 mL Mouth Rinse BID  . Chlorhexidine Gluconate Cloth  6 each Topical Daily  . clonazePAM  1 mg Per Tube Q6H  . docusate  100 mg Per Tube BID  . feeding supplement (PROSource TF)  45 mL Per Tube TID  . fentaNYL (SUBLIMAZE) injection  50 mcg Intravenous Once  . linagliptin  5 mg Per Tube Daily  . mouth rinse  15 mL Mouth Rinse 10 times per day  . oxyCODONE  10 mg Per Tube Q6H  . pantoprazole (PROTONIX) IV  40 mg Intravenous Daily  . polyethylene glycol  17 g Per Tube Daily  . sennosides  5 mL Per Tube BID  . zinc sulfate  220 mg Per Tube Daily    have reviewed scheduled and prn medications.  Physical Exam: General: Critically ill female, has ECMO running.  Not much improvement. Heart:RRR, s1s2 nl Lungs: Coarse breath sound bilateral Abdomen:soft, non-distended Extremities: Anasarca and edema improving. Dialysis Access: Temporary HD catheter  Arriyah Madej Tanna Furry 02/04/2020,9:35 AM  LOS: 27 days  Pager: [phone]

## 2020-02-05 ENCOUNTER — Inpatient Hospital Stay (HOSPITAL_COMMUNITY): Payer: PRIVATE HEALTH INSURANCE

## 2020-02-05 DIAGNOSIS — J9601 Acute respiratory failure with hypoxia: Secondary | ICD-10-CM

## 2020-02-05 LAB — CULTURE, BLOOD (ROUTINE X 2)
Culture: NO GROWTH
Culture: NO GROWTH

## 2020-02-05 LAB — BASIC METABOLIC PANEL
Anion gap: 12 (ref 5–15)
Anion gap: 10 (ref 5–15)
BUN: 44 mg/dL — ABNORMAL HIGH (ref 6–20)
BUN: 40 mg/dL — ABNORMAL HIGH (ref 6–20)
CO2: 23 mmol/L (ref 22–32)
CO2: 25 mmol/L (ref 22–32)
Calcium: 8.1 mg/dL — ABNORMAL LOW (ref 8.9–10.3)
Calcium: 8 mg/dL — ABNORMAL LOW (ref 8.9–10.3)
Chloride: 102 mmol/L (ref 98–111)
Chloride: 103 mmol/L (ref 98–111)
Creatinine, Ser: 1.08 mg/dL — ABNORMAL HIGH (ref 0.44–1.00)
Creatinine, Ser: 0.98 mg/dL (ref 0.44–1.00)
GFR calc Af Amer: >60 mL/min (ref >60)
GFR calc Af Amer: >60 mL/min (ref >60)
GFR calc non Af Amer: 58 mL/min — ABNORMAL LOW (ref >60)
GFR calc non Af Amer: >60 mL/min (ref >60)
Glucose, Bld: 221 mg/dL — ABNORMAL HIGH (ref 70–99)
Glucose, Bld: 169 mg/dL — ABNORMAL HIGH (ref 70–99)
Potassium: 4.3 mmol/L (ref 3.5–5.1)
Potassium: 4.1 mmol/L (ref 3.5–5.1)
Sodium: 137 mmol/L (ref 135–145)
Sodium: 138 mmol/L (ref 135–145)

## 2020-02-05 LAB — PROTIME-INR
INR: 1.1 (ref 0.8–1.2)
INR: 1.1 (ref 0.8–1.2)
Prothrombin Time: 13.8 seconds (ref 11.4–15.2)
Prothrombin Time: 13.8 seconds (ref 11.4–15.2)

## 2020-02-05 LAB — POCT I-STAT 7, (LYTES, BLD GAS, ICA,H+H)
Acid-Base Excess: 4 mmol/L — ABNORMAL HIGH (ref 0.0–2.0)
Acid-Base Excess: 3 mmol/L — ABNORMAL HIGH (ref 0.0–2.0)
Acid-Base Excess: 5 mmol/L — ABNORMAL HIGH (ref 0.0–2.0)
Acid-Base Excess: 3 mmol/L — ABNORMAL HIGH (ref 0.0–2.0)
Acid-Base Excess: 2 mmol/L (ref 0.0–2.0)
Acid-Base Excess: 2 mmol/L (ref 0.0–2.0)
Acid-Base Excess: 2 mmol/L (ref 0.0–2.0)
Bicarbonate: 27.9 mmol/L (ref 20.0–28.0)
Bicarbonate: 27.8 mmol/L (ref 20.0–28.0)
Bicarbonate: 29.4 mmol/L — ABNORMAL HIGH (ref 20.0–28.0)
Bicarbonate: 26.8 mmol/L (ref 20.0–28.0)
Bicarbonate: 26.8 mmol/L (ref 20.0–28.0)
Bicarbonate: 25.9 mmol/L (ref 20.0–28.0)
Bicarbonate: 26.3 mmol/L (ref 20.0–28.0)
Calcium, Ion: 1.09 mmol/L — ABNORMAL LOW (ref 1.15–1.40)
Calcium, Ion: 1.1 mmol/L — ABNORMAL LOW (ref 1.15–1.40)
Calcium, Ion: 1.11 mmol/L — ABNORMAL LOW (ref 1.15–1.40)
Calcium, Ion: 1.14 mmol/L — ABNORMAL LOW (ref 1.15–1.40)
Calcium, Ion: 1.13 mmol/L — ABNORMAL LOW (ref 1.15–1.40)
Calcium, Ion: 1.08 mmol/L — ABNORMAL LOW (ref 1.15–1.40)
Calcium, Ion: 1.08 mmol/L — ABNORMAL LOW (ref 1.15–1.40)
HCT: 24 % — ABNORMAL LOW (ref 36.0–46.0)
HCT: 24 % — ABNORMAL LOW (ref 36.0–46.0)
HCT: 24 % — ABNORMAL LOW (ref 36.0–46.0)
HCT: 23 % — ABNORMAL LOW (ref 36.0–46.0)
HCT: 26 % — ABNORMAL LOW (ref 36.0–46.0)
HCT: 26 % — ABNORMAL LOW (ref 36.0–46.0)
HCT: 26 % — ABNORMAL LOW (ref 36.0–46.0)
Hemoglobin: 8.2 g/dL — ABNORMAL LOW (ref 12.0–15.0)
Hemoglobin: 8.2 g/dL — ABNORMAL LOW (ref 12.0–15.0)
Hemoglobin: 8.2 g/dL — ABNORMAL LOW (ref 12.0–15.0)
Hemoglobin: 7.8 g/dL — ABNORMAL LOW (ref 12.0–15.0)
Hemoglobin: 8.8 g/dL — ABNORMAL LOW (ref 12.0–15.0)
Hemoglobin: 8.8 g/dL — ABNORMAL LOW (ref 12.0–15.0)
Hemoglobin: 8.8 g/dL — ABNORMAL LOW (ref 12.0–15.0)
O2 Saturation: 98 %
O2 Saturation: 98 %
O2 Saturation: 100 %
O2 Saturation: 100 %
O2 Saturation: 100 %
O2 Saturation: 100 %
O2 Saturation: 100 %
Patient temperature: 36.2
Patient temperature: 35.9
Patient temperature: 35.1
Patient temperature: 35.2
Patient temperature: 36
Patient temperature: 36.2
Patient temperature: 97.6
Potassium: 4.9 mmol/L (ref 3.5–5.1)
Potassium: 4.8 mmol/L (ref 3.5–5.1)
Potassium: 4.1 mmol/L (ref 3.5–5.1)
Potassium: 4.2 mmol/L (ref 3.5–5.1)
Potassium: 4.1 mmol/L (ref 3.5–5.1)
Potassium: 3.9 mmol/L (ref 3.5–5.1)
Potassium: 4 mmol/L (ref 3.5–5.1)
Sodium: 137 mmol/L (ref 135–145)
Sodium: 138 mmol/L (ref 135–145)
Sodium: 138 mmol/L (ref 135–145)
Sodium: 137 mmol/L (ref 135–145)
Sodium: 137 mmol/L (ref 135–145)
Sodium: 139 mmol/L (ref 135–145)
Sodium: 139 mmol/L (ref 135–145)
TCO2: 29 mmol/L (ref 22–32)
TCO2: 29 mmol/L (ref 22–32)
TCO2: 31 mmol/L (ref 22–32)
TCO2: 28 mmol/L (ref 22–32)
TCO2: 28 mmol/L (ref 22–32)
TCO2: 27 mmol/L (ref 22–32)
TCO2: 27 mmol/L (ref 22–32)
pCO2 arterial: 34.7 mmHg (ref 32.0–48.0)
pCO2 arterial: 40.3 mmHg (ref 32.0–48.0)
pCO2 arterial: 40.2 mmHg (ref 32.0–48.0)
pCO2 arterial: 35.3 mmHg (ref 32.0–48.0)
pCO2 arterial: 38 mmHg (ref 32.0–48.0)
pCO2 arterial: 35.5 mmHg (ref 32.0–48.0)
pCO2 arterial: 37.3 mmHg (ref 32.0–48.0)
pH, Arterial: 7.511 — ABNORMAL HIGH (ref 7.350–7.450)
pH, Arterial: 7.441 (ref 7.350–7.450)
pH, Arterial: 7.463 — ABNORMAL HIGH (ref 7.350–7.450)
pH, Arterial: 7.481 — ABNORMAL HIGH (ref 7.350–7.450)
pH, Arterial: 7.452 — ABNORMAL HIGH (ref 7.350–7.450)
pH, Arterial: 7.467 — ABNORMAL HIGH (ref 7.350–7.450)
pH, Arterial: 7.453 — ABNORMAL HIGH (ref 7.350–7.450)
pO2, Arterial: 89 mmHg (ref 83.0–108.0)
pO2, Arterial: 90 mmHg (ref 83.0–108.0)
pO2, Arterial: 199 mmHg — ABNORMAL HIGH (ref 83.0–108.0)
pO2, Arterial: 203 mmHg — ABNORMAL HIGH (ref 83.0–108.0)
pO2, Arterial: 181 mmHg — ABNORMAL HIGH (ref 83.0–108.0)
pO2, Arterial: 210 mmHg — ABNORMAL HIGH (ref 83.0–108.0)
pO2, Arterial: 179 mmHg — ABNORMAL HIGH (ref 83.0–108.0)

## 2020-02-05 LAB — CBC
HCT: 26 % — ABNORMAL LOW (ref 36.0–46.0)
HCT: 26.2 % — ABNORMAL LOW (ref 36.0–46.0)
HCT: 28.8 % — ABNORMAL LOW (ref 36.0–46.0)
Hemoglobin: 8.1 g/dL — ABNORMAL LOW (ref 12.0–15.0)
Hemoglobin: 7.9 g/dL — ABNORMAL LOW (ref 12.0–15.0)
Hemoglobin: 9 g/dL — ABNORMAL LOW (ref 12.0–15.0)
MCH: 32.5 pg (ref 26.0–34.0)
MCH: 31.7 pg (ref 26.0–34.0)
MCH: 32.4 pg (ref 26.0–34.0)
MCHC: 31.2 g/dL (ref 30.0–36.0)
MCHC: 30.2 g/dL (ref 30.0–36.0)
MCHC: 31.3 g/dL (ref 30.0–36.0)
MCV: 104.4 fL — ABNORMAL HIGH (ref 80.0–100.0)
MCV: 105.2 fL — ABNORMAL HIGH (ref 80.0–100.0)
MCV: 103.6 fL — ABNORMAL HIGH (ref 80.0–100.0)
Platelets: 60 10*3/uL — ABNORMAL LOW (ref 150–400)
Platelets: 62 10*3/uL — ABNORMAL LOW (ref 150–400)
Platelets: 56 10*3/uL — ABNORMAL LOW (ref 150–400)
RBC: 2.49 MIL/uL — ABNORMAL LOW (ref 3.87–5.11)
RBC: 2.49 MIL/uL — ABNORMAL LOW (ref 3.87–5.11)
RBC: 2.78 MIL/uL — ABNORMAL LOW (ref 3.87–5.11)
RDW: 26.9 % — ABNORMAL HIGH (ref 11.5–15.5)
RDW: 27.3 % — ABNORMAL HIGH (ref 11.5–15.5)
RDW: 25.8 % — ABNORMAL HIGH (ref 11.5–15.5)
WBC: 10.8 10*3/uL — ABNORMAL HIGH (ref 4.0–10.5)
WBC: 8.6 10*3/uL (ref 4.0–10.5)
WBC: 8.6 10*3/uL (ref 4.0–10.5)
nRBC: 11.4 % — ABNORMAL HIGH (ref 0.0–0.2)
nRBC: 9.3 % — ABNORMAL HIGH (ref 0.0–0.2)
nRBC: 6.8 % — ABNORMAL HIGH (ref 0.0–0.2)

## 2020-02-05 LAB — APTT
aPTT: 41 seconds — ABNORMAL HIGH (ref 24–36)
aPTT: 37 seconds — ABNORMAL HIGH (ref 24–36)

## 2020-02-05 LAB — HEPATIC FUNCTION PANEL
ALT: 151 U/L — ABNORMAL HIGH (ref 0–44)
AST: 73 U/L — ABNORMAL HIGH (ref 15–41)
Albumin: 2.1 g/dL — ABNORMAL LOW (ref 3.5–5.0)
Alkaline Phosphatase: 264 U/L — ABNORMAL HIGH (ref 38–126)
Bilirubin, Direct: 0.6 mg/dL — ABNORMAL HIGH (ref 0.0–0.2)
Indirect Bilirubin: 1 mg/dL — ABNORMAL HIGH (ref 0.3–0.9)
Total Bilirubin: 1.6 mg/dL — ABNORMAL HIGH (ref 0.3–1.2)
Total Protein: 5.7 g/dL — ABNORMAL LOW (ref 6.5–8.1)

## 2020-02-05 LAB — GLUCOSE, CAPILLARY
Glucose-Capillary: 126 mg/dL — ABNORMAL HIGH (ref 70–99)
Glucose-Capillary: 136 mg/dL — ABNORMAL HIGH (ref 70–99)
Glucose-Capillary: 141 mg/dL — ABNORMAL HIGH (ref 70–99)
Glucose-Capillary: 142 mg/dL — ABNORMAL HIGH (ref 70–99)
Glucose-Capillary: 144 mg/dL — ABNORMAL HIGH (ref 70–99)
Glucose-Capillary: 151 mg/dL — ABNORMAL HIGH (ref 70–99)
Glucose-Capillary: 152 mg/dL — ABNORMAL HIGH (ref 70–99)
Glucose-Capillary: 154 mg/dL — ABNORMAL HIGH (ref 70–99)
Glucose-Capillary: 160 mg/dL — ABNORMAL HIGH (ref 70–99)
Glucose-Capillary: 163 mg/dL — ABNORMAL HIGH (ref 70–99)
Glucose-Capillary: 164 mg/dL — ABNORMAL HIGH (ref 70–99)
Glucose-Capillary: 220 mg/dL — ABNORMAL HIGH (ref 70–99)
Glucose-Capillary: 244 mg/dL — ABNORMAL HIGH (ref 70–99)
Glucose-Capillary: 83 mg/dL (ref 70–99)

## 2020-02-05 LAB — HEPARIN LEVEL (UNFRACTIONATED): Heparin Unfractionated: <0.1 IU/mL — ABNORMAL LOW (ref 0.30–0.70)

## 2020-02-05 LAB — FIBRINOGEN: Fibrinogen: 756 mg/dL — ABNORMAL HIGH (ref 210–475)

## 2020-02-05 LAB — LACTIC ACID, PLASMA
Lactic Acid, Venous: 1.3 mmol/L (ref 0.5–1.9)
Lactic Acid, Venous: 1.8 mmol/L (ref 0.5–1.9)

## 2020-02-05 LAB — LACTATE DEHYDROGENASE: LDH: 766 U/L — ABNORMAL HIGH (ref 98–192)

## 2020-02-05 LAB — PHOSPHORUS: Phosphorus: 2.9 mg/dL (ref 2.5–4.6)

## 2020-02-05 LAB — MAGNESIUM: Magnesium: 2.8 mg/dL — ABNORMAL HIGH (ref 1.7–2.4)

## 2020-02-05 LAB — PREPARE RBC (CROSSMATCH)

## 2020-02-05 MED ORDER — MIDAZOLAM HCL 2 MG/2ML IJ SOLN
5.0000 mg | Freq: Once | INTRAMUSCULAR | Status: AC
  Administered 2020-02-05: 4 mg via INTRAVENOUS
  Filled 2020-02-05: qty 6

## 2020-02-05 MED ORDER — SODIUM CHLORIDE 0.9% IV SOLUTION: Freq: Once | INTRAVENOUS | Status: DC

## 2020-02-05 MED ORDER — VECURONIUM BROMIDE 10 MG IV SOLR
10.0000 mg | Freq: Once | INTRAVENOUS | Status: AC
  Administered 2020-02-05: 10 mg via INTRAVENOUS
  Filled 2020-02-05: qty 10

## 2020-02-05 MED ORDER — "THROMBI-PAD 3""X3"" EX PADS"
1.0000 | MEDICATED_PAD | Freq: Once | CUTANEOUS | Status: AC
  Administered 2020-02-05: 1 via TOPICAL
  Filled 2020-02-05: qty 1

## 2020-02-05 MED ORDER — HEPARIN (PORCINE) 25000 UT/250ML-% IV SOLN
500.0000 [IU]/h | INTRAVENOUS | Status: DC
  Filled 2020-02-05: qty 250

## 2020-02-05 MED ORDER — ETOMIDATE 2 MG/ML IV SOLN
40.0000 mg | Freq: Once | INTRAVENOUS | Status: AC
  Administered 2020-02-05: 20 mg via INTRAVENOUS
  Filled 2020-02-05: qty 20

## 2020-02-05 MED ORDER — SODIUM CHLORIDE 0.9% IV SOLUTION: Freq: Once | INTRAVENOUS | Status: AC

## 2020-02-05 MED ORDER — FENTANYL CITRATE (PF) 100 MCG/2ML IJ SOLN
200.0000 ug | Freq: Once | INTRAMUSCULAR | Status: AC
  Administered 2020-02-05: 100 ug via INTRAVENOUS
  Filled 2020-02-05: qty 4

## 2020-02-05 NOTE — Procedures (Signed)
Percutaneous Tracheostomy Procedure Note   GENELL THEDE  [id number]  1965/07/19  Date:02/05/20  Time:2:01 PM   Provider Performing:Lakeem Rozo C Tamala Julian  Procedure: Percutaneous Tracheostomy with Bronchoscopic Guidance (71292)  Indication(s) Persistent respiratory failure  Consent Risks of the procedure as well as the alternatives and risks of each were explained to the patient and/or caregiver.  Consent for the procedure was obtained.  Anesthesia Etomidate, Versed, Fentanyl, Vecuronium   Time Out Verified patient identification, verified procedure, site/side was marked, verified correct patient position, special equipment/implants available, medications/allergies/relevant history reviewed, required imaging and test results available.   Sterile Technique Maximal sterile technique including sterile barrier drape, hand hygiene, sterile gown, sterile gloves, mask, hair covering.    Procedure Description Appropriate anatomy identified by palpation.  Patient's neck prepped and draped in sterile fashion.  1% lidocaine with epinephrine was used to anesthetize skin overlying neck.  1.5cm incision made and blunt dissection performed until tracheal rings could be easily palpated.   Then a size 8-0 Shiley tracheostomy was placed under bronchoscopic visualization using usual Seldinger technique and serial dilation.   Bronchoscope confirmed placement above the carina.  Tracheostomy was sutured in place with adhesive pad to protect skin under pressure.    Patient connected to ventilator.   Complications/Tolerance None; patient tolerated the procedure well. Chest X-ray is ordered to confirm no post-procedural complication.   EBL Minimal   Specimen(s) None

## 2020-02-05 NOTE — Plan of Care (Signed)
Pt is alert to voice, able to follow simple commands, only able to move her right side, and nods appropriately to yes/no questions. Pt sometimes nods when asked about pain and anxiety. Pt's blood pressure significantly increases when anxious and pt bites down on ETT. Pt is reassured and sedation is increased, but then blood pressure drops down fast. Pt  responds better to verbal stimuli and anticipation of nursing care rather than titration of medications.  Pt does not tolerate full turns well. No CRRT issues/alarms noted since 2345 last night.    Problem: Clinical Measurements: Goal: Ability to maintain clinical measurements within normal limits will improve Outcome: Progressing Goal: Diagnostic test results will improve Outcome: Progressing Goal: Respiratory complications will improve Outcome: Progressing Goal: Cardiovascular complication will be avoided Outcome: Progressing   Problem: Activity: Goal: Risk for activity intolerance will decrease Outcome: Progressing

## 2020-02-05 NOTE — Progress Notes (Signed)
Patient ID: Alisha Stephenson, female   DOB: 02-Aug-1965, 54 y.o.   MRN: [id number]    Advanced Heart Failure Rounding Note   Subjective:    - 8/2 COVID + test - 8/9 Cannulated for VV ECMO - 8/13 with several areas of intracranial hemorrhage. Bival stopped.  - 8/14 CT no change in [place]. Increased edema - 8/16 Extubated - 8/16 Head CT stable bleed - 8/22 CVVHD started - 8/24 reintubated - 8/24 circuit changed  Remains awake on vent. Will follow commands. Appears agitated with ET tube.   Remains on CVVHD. Volume status looks much better on exam. Weight 25 pounds over past few days.   Heparin in circuit at 500u/hr. Hgb 8.1 PLTs 60k  LDH 766  Eraxis for fungal coverage, Zosyn for Enterococcal bacteremina.   Currently off pressors.  Remains on milrinone 0.125 for RV.  BPs stable.   CXR with severe bilateral diffuse infiltrates. No change. Personally reviewed  ECMO   Flow 5.05 L RPM 4200 Sweep12  Labs:  7.44/40/90/90% Hgb8.1 PLT 32k -> 46k -> 61 -> 61 -> 60 LDH 429 -> 451 -> 576 -> 527 -> 547 -> 655 -> 917 -> 1,141 -> 1250 -> 1,288 -> 1130 -> 1016 -> 766 Lactic acid1.3 Fibrinogen 176 -> 316 -> 521 -> 611 -> 722 -> 756  Objective:   Weight Range:  Vital Signs:   Temp:  [96.1 F (35.6 C)-98.2 F (36.8 C)] 96.4 F (35.8 C) (08/30 0700) Pulse Rate:  [64-108] 78 (08/30 0700) Resp:  [15-29] 15 (08/30 0400) BP: (92-196)/(48-66) 196/66 (08/29 1533) SpO2:  [83 %-100 %] 95 % (08/30 0738) Arterial Line BP: (79-193)/(42-94) 88/48 (08/30 0700) FiO2 (%):  [40 %] 40 % (08/30 0738) Weight:  [122 kg] 122 kg (08/30 0446) Last BM Date: 02/04/20  Weight change: Filed Weights   02/02/20 0500 02/03/20 0500 02/05/20 0446  Weight: 130.9 kg 126.4 kg 122 kg    Intake/Output:   Intake/Output Summary (Last 24 hours) at 02/05/2020 0757 Last data filed at 02/05/2020 0700 Gross per 24 hour  Intake 3046.78 ml  Output 4264 ml  Net -1217.22 ml     Physical Exam: General:  Awake  on vent. Following some commands HEENT: normal + ETT Neck: supple. R ECMO cannula  Carotids 2+ bilat; no bruits. No lymphadenopathy or thryomegaly appreciated. Cor: PMI nondisplaced. Regular rate & rhythm. No rubs, gallops or murmurs. Lungs: minimal air movement  Abdomen: obese soft, nontender, nondistended. No hepatosplenomegaly. No bruits or masses. Good bowel sounds. Extremities: no cyanosis, clubbing, rash, 1-2+ edema mild cyanotic changes on hands and feet  Neuro: awake on vent will follow some commands   Telemetry: Sinus 80-90s Personally reviewed   Labs: Basic Metabolic Panel: Recent Labs  Lab 02/01/20 0226 02/01/20 0635 02/02/20 0330 02/02/20 0436 02/03/20 0313 02/03/20 0437 02/03/20 1630 02/03/20 1641 02/04/20 0404 02/04/20 0836 02/04/20 1548 02/04/20 2010 02/05/20 0304 02/05/20 0403 02/05/20 0625  NA 137   < > 135   < > 137   < > 136   < > 137   < > 136 137 137 137 138  K 4.3   < > 4.2   < > 3.6   < > 5.9*   < > 4.4   < > 4.4 4.6 4.3 4.9 4.8  CL 101   < > 100   < > 102  --  101  --  102  --  101  --  102  --   --  CO2 23   < > 24   < > 24  --  23  --  24  --  24  --  23  --   --   GLUCOSE 207*   < > 182*   < > 217*  --  177*  --  198*  --  171*  --  221*  --   --   BUN 50*   < > 42*   < > 47*  --  45*  --  46*  --  46*  --  44*  --   --   CREATININE 1.05*   < > 1.10*   < > 1.15*  --  1.06*  --  1.10*  --  1.01*  --  1.08*  --   --   CALCIUM 7.3*   < > 7.7*   < > 7.8*   < > 8.0*   < > 7.9*  --  8.2*  --  8.1*  --   --   MG 2.7*  --  2.8*  --  2.6*  --   --   --  2.8*  --   --   --  2.8*  --   --   PHOS 4.4  --  3.7  --  2.6  --   --   --  3.2  --   --   --  2.9  --   --    < > = values in this interval not displayed.    Liver Function Tests: Recent Labs  Lab 02/01/20 0226 02/02/20 0330 02/03/20 0313 02/04/20 0404 02/05/20 0304  AST 245* 195* 162* 105* 73*  ALT 118* 134* 165* 158* 151*  ALKPHOS 204* 259* 252* 243* 264*  BILITOT 1.9* 1.9* 1.6* 1.8* 1.6*   PROT 4.9* 5.4* 5.7* 5.7* 5.7*  ALBUMIN 2.3* 2.4* 2.3* 2.2* 2.1*   No results for input(s): LIPASE, AMYLASE in the last 168 hours. No results for input(s): AMMONIA in the last 168 hours.  CBC: Recent Labs  Lab 02/03/20 0313 02/03/20 0437 02/03/20 1630 02/03/20 1641 02/04/20 0404 02/04/20 0836 02/04/20 1548 02/04/20 2010 02/05/20 0304 02/05/20 0403 02/05/20 0625  WBC 16.3*  --  13.6*  --  13.6*  --  17.6*  --  10.8*  --   --   HGB 8.5*   < > 8.2*   < > 8.5*   < > 8.9* 7.8* 8.1* 8.2* 8.2*  HCT 27.1*   < > 25.9*   < > 27.1*   < > 28.5* 23.0* 26.0* 24.0* 24.0*  MCV 101.9*  --  102.8*  --  104.6*  --  104.4*  --  104.4*  --   --   PLT 61*  --  61*  --  61*  --  72*  --  60*  --   --    < > = values in this interval not displayed.    Cardiac Enzymes: No results for input(s): CKTOTAL, CKMB, CKMBINDEX, TROPONINI in the last 168 hours.  BNP: BNP (last 3 results) No results for input(s): BNP in the last 8760 hours.  ProBNP (last 3 results) No results for input(s): PROBNP in the last 8760 hours.    Other results:  Imaging: DG CHEST PORT 1 VIEW  Result Date: 02/04/2020 CLINICAL DATA:  ECMO. EXAM: PORTABLE CHEST 1 VIEW COMPARISON:  February 03, 2020 FINDINGS: The ETT is in good position. The feeding tube terminates below today's film. A left  central line is stable. A right PICC line is again seen although the distal tip is difficult to visualize. An ECMO device remains. Near complete opacification of the lungs remains. No other changes. A left subclavian central line is stable. IMPRESSION: 1. Support apparatus as above. 2. Near complete opacification of the lungs is stable. Electronically Signed   By: Dorise Bullion III M.D   On: 02/04/2020 10:51     Medications:     Scheduled Medications:  sodium chloride   Intravenous Once   sodium chloride   Intravenous Once   vitamin C  500 mg Per Tube Daily   B-complex with vitamin C  1 tablet Oral Daily   chlorhexidine gluconate  (MEDLINE KIT)  15 mL Mouth Rinse BID   Chlorhexidine Gluconate Cloth  6 each Topical Daily   clonazePAM  1 mg Per Tube Q6H   docusate  100 mg Per Tube BID   feeding supplement (PROSource TF)  45 mL Per Tube TID   fentaNYL (SUBLIMAZE) injection  50 mcg Intravenous Once   linagliptin  5 mg Per Tube Daily   mouth rinse  15 mL Mouth Rinse 10 times per day   oxyCODONE  10 mg Per Tube Q6H   pantoprazole (PROTONIX) IV  40 mg Intravenous Daily   polyethylene glycol  17 g Per Tube Daily   sennosides  5 mL Per Tube BID   zinc sulfate  220 mg Per Tube Daily    Infusions:   prismasol BGK 4/2.5 300 mL/hr at 02/05/20 0221   sodium chloride 500 mL (02/01/20 1958)   albumin human 12.5 g (01/28/20 1229)   albumin human     amiodarone 30 mg/hr (02/05/20 0700)   anidulafungin Stopped (02/04/20 1757)   dexmedetomidine (PRECEDEX) IV infusion 1 mcg/kg/hr (02/05/20 0700)   feeding supplement (PIVOT 1.5 CAL) 60 mL/hr at 02/04/20 1600   heparin 500 Units/hr (02/05/20 0700)   HYDROmorphone 1.5 mg/hr (02/05/20 0700)   insulin 10.5 Units/hr (02/05/20 0716)   milrinone 0.125 mcg/kg/min (02/05/20 0700)   norepinephrine (LEVOPHED) Adult infusion Stopped (02/03/20 1405)   piperacillin-tazobactam (ZOSYN)  IV Stopped (02/05/20 0536)   prismasol BGK 2/2.5 replacement solution 500 mL/hr at 02/05/20 0114   prismasol BGK 4/2.5 2,000 mL/hr at 02/05/20 0455   vasopressin Stopped (02/05/20 0407)    PRN Medications: sodium chloride, acetaminophen (TYLENOL) oral liquid 160 mg/5 mL, albumin human, albuterol, dextrose, fentaNYL, fentaNYL (SUBLIMAZE) injection, guaiFENesin-dextromethorphan, heparin, hydrALAZINE, HYDROmorphone, hydrOXYzine, ondansetron **OR** ondansetron (ZOFRAN) IV, sodium chloride   Assessment/Plan:   1. Acute hypoxic/hypercapneic respiratory failure in setting of severe COVID PNA/ARDS -> VV ECMO - admit 8/2 - intubation 8/3 - has received actmera (compelted 8/2),  remdesivir (completed 8/6) and steroids - failed full vent support with proning/paralytic -> Cannulated for VV ECMO on 8/9 - Extubated 8/16. Reintubated 8/24 - CXR unchanged -> diffuse infiltrates.Personally reviewed - Circuit changed 8/24 due to concern for hemolysis and worsening oxygenation. Oxygenation improved. LDH trending down and PLTs and fibrinogen trending up.  - CVVHD started 8/22 for volume removal and uremia. Remains anuric. Volume status much improved.W eight down 25 pounds.  - Still with multi-system organ failure (brain, lungs, kidneys, liver, vascular injury).  Prognosis remains concerning but has held steady this past week. Hopeful for continued progress pending recovery of her lung function. Will plan for trach today.  2. Enterococcus sepsis - back on high-dose zosyn per ID - Eraxis added on 8/26 with yeast in BAL 8/24 - Now off pressors. Can  stop milrinone   3. Intracranial hemorrhage - ? Septic emboli - repeat head CT on 8/14 with stable bleeds but increased edema - neurology has seen. Suspect significant long-term injury sustained. Will follow commands. Appears to have dense LUE weakness and possibly LLE - repeat head CT stable 8/16 - Now back on low-dose heparin to protect circuit from further clotting. Continue heparin at 500u/hr. Discussed dosing with PharmD personally. - Consider TEE at some point to further evalaute. Can consider now that she is re-intubated but won't change management currently  4. Thrombocytopenia - PLTs up to 60K today after circuit change - Continue to follow  5. Morbid obesity - Body mass index is Body mass index is 46.17 kg/m.  6. Poorly controlled DM2 - HgBA1c 10.7 - CBGs remain elevated - On IV insulin adjust as needed  6. Hypernatremia - Resolved with HD.   7. Lactic acidosis -  Normal at 1.3 today. MAP stable, ABG improving.  Will follow.   8. PAF - intermittent episodes. Last 8/26 - In NSR on IV amio this am. Billey Gosling  continue   9. AKI/azotemia - CVVHD started on 8/22 - Net negative yesterday, continue gentle UF.   10. Ischemic R fingers - ? Due to septic embolic versus vascular issue - VVS following. No change  11. Acute liver failure - has underlying fatty liver - mix of conjugated/unconjugated   CRITICAL CARE Performed by: Glori Bickers  Total critical care time: 35 minutes  Critical care time was exclusive of separately billable procedures and treating other patients.  Critical care was necessary to treat or prevent imminent or life-threatening deterioration.  Critical care was time spent personally by me (independent of midlevel providers or residents) on the following activities: development of treatment plan with patient and/or surrogate as well as nursing, discussions with consultants, evaluation of patient's response to treatment, examination of patient, obtaining history from patient or surrogate, ordering and performing treatments and interventions, ordering and review of laboratory studies, ordering and review of radiographic studies, pulse oximetry and re-evaluation of patient's condition.   Length of Stay: 105   Glori Bickers  MD 02/05/2020, 7:57 AM  Advanced Heart Failure Team Pager [phone] (M-F; 7a - 4p)  Please contact [facility] for night-coverage after hours (4p -7a ) and weekends on [email or web address]

## 2020-02-05 NOTE — Progress Notes (Signed)
SLP Cancellation Note  Patient Details Name: Alisha Stephenson MRN: [id number] DOB: [birth year]   Cancelled treatment:         Patient with new tracheostomy. Orders for SLP eval and treat for PMSV and swallowing received. Will follow pt closely for readiness for SLP interventions as appropriate.     Osie Bond., M.A. [facility] Pager [phone] Office [phone]  02/05/2020, 3:04 PM

## 2020-02-05 NOTE — Progress Notes (Signed)
NAME:  Alisha Stephenson, MRN:  [id number], DOB:  1966-03-24, LOS: 51 ADMISSION DATE:  01/26/2020, CONSULTATION DATE:  8/3 REFERRING MD:  Alfredia Ferguson, CHIEF COMPLAINT:  Dyspnea   Brief History   54 y/o female admitted on 8/2 with severe acute respiratory failure with hypoxemia due to COVID 19 pneumonia.  She developed symptoms 1 week prior to admission.  Past Medical History  DM2 Diverticulitis Gallstones Ovarian cyst NAFLD Asthma  Significant Hospital Events   8/2 admit 8/3 ICU transfer, intubated 8/4 prone, paralyze 8/9 significant desaturations today 8/9 VV ECMO cannulation 8/13 ICH  Consults:  PCCM ECMO team   Procedures:  8/3 ETT >  8/3 PICC >  8/9 [facility] MML 8/9 R[facility] [place] 4F   Significant Diagnostic Tests:  7/31 CT head > NAICP 7/31 MRI/MRA brain > no acute changes, possibly small aneurysm ACOM 8/13 CT head> multiple areas of ICH  Micro Data:  8/2 blood > NG 8/2 SARS COV 2 > positive 8/4 resp > negative 8/4 urine >  8/12 blood > E. faecalis (pan-sensitive) 8/12 resp: staph aureus> MSSA 8/25 blood>> NG 8/24 BAL> yeast  Antimicrobials:  See fever tab for past abx Current Eraxis 8/26>> Zosyn 8/23>>  Interim history/subjective:  No bleeding overnight.  Awake and following commands.  Objective   Blood pressure (!) 196/66, pulse 78, temperature (!) 96.4 F (35.8 C), resp. rate 15, height _0  (1.626 m), weight 122 kg, SpO2 100 %. CVP:  [12 mmHg-20 mmHg] 16 mmHg  Vent Mode: PCV FiO2 (%):  [40 %] 40 % Set Rate:  [15 bmp] 15 bmp PEEP:  [12 cmH20] 12 cmH20 Plateau Pressure:  [23 cmH20-24 cmH20] 24 cmH20   Intake/Output Summary (Last 24 hours) at 02/05/2020 0711 Last data filed at 02/05/2020 0700 Gross per 24 hour  Intake 3046.78 ml  Output 4264 ml  Net -1217.22 ml   Filed Weights   02/02/20 0500 02/03/20 0500 02/05/20 0446  Weight: 130.9 kg 126.4 kg 122 kg    Examination:  GEN: critically ill woman lying in bed HEENT: ETT in place, small thick  secretions CV: RRR, ext warm PULM: minimal breath sounds GI: EXT: improving edema NEURO: PSYCH: SKIN:  Plts down slightly Sugars labile LDH 1016>>756 Hgb stable ABG pO2 90 improved Vent plat 13, PC 12/12 rate 15, not breathing over ABG markedly alkalotic Lactic 1.6>>1.3  Resolved Hospital Problem list     Assessment & Plan:     Critically ill due to Acute hypoxic and AoC hypercapneic respiratory failure; likely underly OHS requiring VV ECMO support and mechanical ventilation.  ARDS due to COVID 19 pneumonia   MSSA pneumonia Possibly fungal infection vs contamination. Clinically consistent with infection causing sepsis. Enterococcal bacteremia-recent blood cultures cleared Concern for possible embolic phenomenon- fingers and possibly CVAs were embolic  Elevated LDH, hypo-fibrinogenemia and thrombocytopenia ICH, multifocal. L-sided weakness.  Encephalopathy, improved Acute renal failure, severe uremia.  Anuric.Marland Kitchen Shock related to  RV stunning- milrinone  Septic shock- Enterococcus bacteremias Hyperglycemia > not controlled at all with [place] insulin despite aggressive upward titration.  Assume poor absorption, potentially due to subcutaneous edema.   Remains elevated on IV.  Plan: -Improving oxygenation with fluid removal.  Continue for now, nearing dry weight. -Continue to slowly wean sedation to allow for greater patient participation in care -Trach today with plt transfusion  Best practice:  Diet: tube feeding Pain/Anxiety/Delirium protocol (if indicated): as above VAP protocol (if indicated): yes DVT prophylaxis: SCDs , very low dose heparin GI prophylaxis: pantoprazole  Glucose control:  insulin infusion  Mobility: bed rest Code Status: limited Family Communication: Husband updated Disposition: ICU    The patient is critically ill with multiple organ systems failure and requires high complexity decision making for assessment and support, frequent evaluation and  titration of therapies, application of advanced monitoring technologies and extensive interpretation of multiple databases. Critical Care Time devoted to patient care services described in this note independent of APP/resident time (if applicable)  is 54 minutes.   Erskine Emery MD [facility] 02/05/2020 7:16 AM Personal pager: [phone] If unanswered, please page CCM On-call: [phone]

## 2020-02-05 NOTE — Progress Notes (Signed)
[facility] ROUNDING NOTE   Subjective:   This is a 54 year old lady with history of diabetes obesity presented with acute hypoxic respiratory failure due to COVID-19 infection.  Hospitalization has been complicated by severe shock refractory hypoxia requiring ECMO and multiple infections, intracranial hemorrhage and encephalopathy.  Baseline creatinine appears to be about 0.7 mg/dL.  She developed acute shock secondary to acute tubular necrosis and started on CRRT 01/28/2020.  Blood pressure 92/48 pulse 82 temperature 96 O2 sats 9 9% FiO2 40%  IV pressors: Milrinone, vasopressin, norepinephrine  IV amiodarone IV heparin  Sodium 138 potassium 4.8 chloride 102 CO2 23 BUN 44 creatinine 1.08 glucose 221 phosphorus 2.9 magnesium 2.8 albumin 2.1 AST 73 ALT 151.  Hemoglobin 8.2 platelets 60 WBC 10.8  Protonix 40 mg daily, oxycodone 10 mg every 6 hours, Tradjenta 5 mg daily  Eraxis 200 mg daily, IV Zosyn 4.5 g every 6 hours  Objective:  Vital signs in last 24 hours:  Temp:  [96.1 F (35.6 C)-98.2 F (36.8 C)] 96.6 F (35.9 C) (08/30 0600) Pulse Rate:  [64-108] 89 (08/30 0600) Resp:  [15-29] 15 (08/30 0400) BP: (92-196)/(48-66) 196/66 (08/29 1533) SpO2:  [83 %-99 %] 95 % (08/30 0600) Arterial Line BP: (79-193)/(42-94) 117/53 (08/30 0600) FiO2 (%):  [40 %] 40 % (08/30 0400) Weight:  [122 kg] 122 kg (08/30 0446)  Weight change:  Filed Weights   02/02/20 0500 02/03/20 0500 02/05/20 0446  Weight: 130.9 kg 126.4 kg 122 kg    Intake/Output: I/O last 3 completed shifts: In: 7253 [I.V.:2653.8; NG/GT:1470; IV Piggyback:851.2] Out: 6644 [IHKVQ:2595; GLOVF:643]   Intake/Output this shift:  Total I/O In: 1489.9 [I.V.:801.1; NG/GT:480; IV Piggyback:208.8] Out: 3295 [JOACZ:6606; Stool:100]  General: Critically ill female, has ECMO running.  Not much improvement. Heart:RRR, s1s2 nl Lungs: Coarse breath sound bilateral Abdomen:soft, non-distended Extremities: Anasarca and  edema improving. Dialysis Access: Temporary HD catheter   Basic Metabolic Panel: Recent Labs  Lab 02/01/20 0226 02/01/20 3016 02/02/20 0330 02/02/20 0436 02/03/20 0313 02/03/20 0437 02/03/20 1630 02/03/20 1641 02/04/20 0404 02/04/20 0836 02/04/20 1548 02/04/20 2010 02/05/20 0304 02/05/20 0403 02/05/20 0625  NA 137   < > 135   < > 137   < > 136   < > 137   < > 136 137 137 137 138  K 4.3   < > 4.2   < > 3.6   < > 5.9*   < > 4.4   < > 4.4 4.6 4.3 4.9 4.8  CL 101   < > 100   < > 102  --  101  --  102  --  101  --  102  --   --   CO2 23   < > 24   < > 24  --  23  --  24  --  24  --  23  --   --   GLUCOSE 207*   < > 182*   < > 217*  --  177*  --  198*  --  171*  --  221*  --   --   BUN 50*   < > 42*   < > 47*  --  45*  --  46*  --  46*  --  44*  --   --   CREATININE 1.05*   < > 1.10*   < > 1.15*  --  1.06*  --  1.10*  --  1.01*  --  1.08*  --   --  CALCIUM 7.3*   < > 7.7*   < > 7.8*   < > 8.0*   < > 7.9*  --  8.2*  --  8.1*  --   --   MG 2.7*  --  2.8*  --  2.6*  --   --   --  2.8*  --   --   --  2.8*  --   --   PHOS 4.4  --  3.7  --  2.6  --   --   --  3.2  --   --   --  2.9  --   --    < > = values in this interval not displayed.    Liver Function Tests: Recent Labs  Lab 02/01/20 0226 02/02/20 0330 02/03/20 0313 02/04/20 0404 02/05/20 0304  AST 245* 195* 162* 105* 73*  ALT 118* 134* 165* 158* 151*  ALKPHOS 204* 259* 252* 243* 264*  BILITOT 1.9* 1.9* 1.6* 1.8* 1.6*  PROT 4.9* 5.4* 5.7* 5.7* 5.7*  ALBUMIN 2.3* 2.4* 2.3* 2.2* 2.1*   No results for input(s): LIPASE, AMYLASE in the last 168 hours. No results for input(s): AMMONIA in the last 168 hours.  CBC: Recent Labs  Lab 02/03/20 0313 02/03/20 0437 02/03/20 1630 02/03/20 1641 02/04/20 0404 02/04/20 0836 02/04/20 1548 02/04/20 2010 02/05/20 0304 02/05/20 0403 02/05/20 0625  WBC 16.3*  --  13.6*  --  13.6*  --  17.6*  --  10.8*  --   --   HGB 8.5*   < > 8.2*   < > 8.5*   < > 8.9* 7.8* 8.1* 8.2* 8.2*  HCT  27.1*   < > 25.9*   < > 27.1*   < > 28.5* 23.0* 26.0* 24.0* 24.0*  MCV 101.9*  --  102.8*  --  104.6*  --  104.4*  --  104.4*  --   --   PLT 61*  --  61*  --  61*  --  72*  --  60*  --   --    < > = values in this interval not displayed.    Cardiac Enzymes: No results for input(s): CKTOTAL, CKMB, CKMBINDEX, TROPONINI in the last 168 hours.  BNP: Invalid input(s): POCBNP  CBG: Recent Labs  Lab 02/05/20 0048 02/05/20 0113 02/05/20 0211 02/05/20 0307 02/05/20 0509  GLUCAP 83 144* 152* 163* 64*    Microbiology: Results for orders placed or performed during the hospital encounter of 01/12/2020  Blood Culture (routine x 2)     Status: None   Collection Time: 02/02/2020 10:20 AM   Specimen: BLOOD  Result Value Ref Range Status   Specimen Description   Final    BLOOD RIGHT ANTECUBITAL Performed at [facility], [address] [place], [place] [address]    Special Requests   Final    BOTTLES DRAWN AEROBIC AND ANAEROBIC Blood Culture adequate volume Performed at [facility], [address] [place], [place] [address]    Culture   Final    NO GROWTH 5 DAYS Performed at [facility], [place] [address] [place], [place] [address]    Report Status 01/13/2020 FINAL  Final  Blood Culture (routine x 2)     Status: None   Collection Time: 01/18/2020 11:19 AM   Specimen: BLOOD  Result Value Ref Range Status   Specimen Description   Final    BLOOD BLOOD RIGHT FOREARM Performed at [facility], [address] [place], [place] [address]    Special Requests   Final    BOTTLES DRAWN AEROBIC ONLY Blood Culture results may not be optimal due to an inadequate volume of blood received in culture bottles Performed at [facility], [address] [place], [place] [address]    Culture   Final    NO GROWTH 5 DAYS Performed at [facility], [place] [address] [place], [place] [address]    Report Status 01/13/2020 FINAL  Final  SARS  Coronavirus 2 by RT PCR (hospital order, performed in [facility] hospital lab) Nasopharyngeal Nasopharyngeal Swab     Status: Abnormal   Collection Time: 01/26/2020 11:19 AM   Specimen: Nasopharyngeal Swab  Result Value Ref Range Status   SARS Coronavirus 2 POSITIVE (A) NEGATIVE Final    Comment: RESULT CALLED TO, READ BACK BY AND VERIFIED WITH:  SIMMS MARVA, RN @ 2707 ON 01/26/2020, CABELLERO.P (NOTE) SARS-CoV-2 target nucleic acids are DETECTED  SARS-CoV-2 RNA is generally detectable in upper respiratory specimens  during the acute phase of infection.  Positive results are indicative  of the presence of the identified virus, but do not rule out bacterial infection or co-infection with other pathogens not detected by the test.  Clinical correlation with patient history and  other diagnostic information is necessary to determine patient infection status.  The expected result is negative.  Fact Sheet for Patients:   StrictlyIdeas.no   Fact Sheet for Healthcare Providers:   BankingDealers.co.za    This test is not yet approved or cleared by the [place] FDA and  has been authorized for detection and/or diagnosis of SARS-CoV-2 by FDA under an Emergency Use Authorization (EUA).  This EUA will remain in effect  (meaning this test can be used) for the duration of  the COVID-19 declaration under Section 564(b)(1) of the Act, 21 U.S.C. section 360-bbb-3(b)(1), unless the authorization is terminated or revoked sooner.  Performed at [facility], [address] [place], [place] [address]   Culture, respiratory (non-expectorated)     Status: None   Collection Time: 01/10/20  8:32 AM   Specimen: Tracheal Aspirate; Respiratory  Result Value Ref Range Status   Specimen Description   Final    TRACHEAL ASPIRATE Performed at [facility] [address], [place], [place] [address]    Special Requests   Final     NONE Performed at [facility], [place] [address] [place], [place] [address]    Gram Stain NO WBC SEEN NO ORGANISMS SEEN   Final   Culture   Final    FEW Consistent with normal respiratory flora. No Pseudomonas species isolated Performed at [place] [address] [place], [place] [address]    Report Status 01/12/2020 FINAL  Final  MRSA PCR Screening     Status: None   Collection Time: 01/10/20  6:11 PM   Specimen: Nasal Mucosa; Nasopharyngeal  Result Value Ref Range Status   MRSA by PCR NEGATIVE NEGATIVE Final    Comment:        The GeneXpert MRSA Assay (FDA approved for NASAL specimens only), is one component of a comprehensive MRSA colonization surveillance program. It is not intended to diagnose MRSA infection nor to guide or monitor treatment for MRSA infections. Performed at [facility], [place] [address], [place], [place] [address]   Culture, Urine     Status: None  Collection Time: 01/10/20  6:11 PM   Specimen: Urine, Random  Result Value Ref Range Status   Specimen Description   Final    URINE, RANDOM Performed at [facility] [address], [place], [place] [address]    Special Requests   Final    NONE Performed at [facility], [place] [address] [place], [place] [address]    Culture   Final    NO GROWTH Performed at [facility], [place] [address] [place], [place] [address]    Report Status 01/11/2020 FINAL  Final  Culture, respiratory (non-expectorated)     Status: None   Collection Time: 01/18/20  8:44 AM   Specimen: Tracheal Aspirate; Respiratory  Result Value Ref Range Status   Specimen Description TRACHEAL ASPIRATE  Final   Special Requests NONE  Final   Gram Stain   Final    NO WBC SEEN FEW GRAM POSITIVE COCCI IN PAIRS IN CLUSTERS Performed at [facility], [address] [place], [place] [address]    Culture ABUNDANT STAPHYLOCOCCUS AUREUS  Final    Report Status 01/20/2020 FINAL  Final   Organism ID, Bacteria STAPHYLOCOCCUS AUREUS  Final      Susceptibility   Staphylococcus aureus - MIC*    CIPROFLOXACIN <=0.5 SENSITIVE Sensitive     ERYTHROMYCIN >=8 RESISTANT Resistant     GENTAMICIN <=0.5 SENSITIVE Sensitive     OXACILLIN <=0.25 SENSITIVE Sensitive     TETRACYCLINE <=1 SENSITIVE Sensitive     VANCOMYCIN <=0.5 SENSITIVE Sensitive     TRIMETH/SULFA <=10 SENSITIVE Sensitive     CLINDAMYCIN RESISTANT Resistant     RIFAMPIN <=0.5 SENSITIVE Sensitive     Inducible Clindamycin POSITIVE Resistant     * ABUNDANT STAPHYLOCOCCUS AUREUS  Culture, blood (Routine X 2) w Reflex to ID Panel     Status: Abnormal   Collection Time: 01/18/20 10:17 AM   Specimen: BLOOD  Result Value Ref Range Status   Specimen Description BLOOD LEFT ANTECUBITAL  Final   Special Requests   Final    BOTTLES DRAWN AEROBIC ONLY Blood Culture adequate volume   Culture  Setup Time   Final    AEROBIC BOTTLE ONLY GRAM POSITIVE COCCI Organism ID to follow CRITICAL RESULT CALLED TO, READ BACK BY AND VERIFIED WITH: D[place] B[facility] 01/19/20 0044 JDW Performed at [facility], [address], [place], [place] [address]    Culture ENTEROCOCCUS FAECALIS (A)  Final   Report Status 01/20/2020 FINAL  Final   Organism ID, Bacteria ENTEROCOCCUS FAECALIS  Final      Susceptibility   Enterococcus faecalis - MIC*    AMPICILLIN <=2 SENSITIVE Sensitive     VANCOMYCIN 1 SENSITIVE Sensitive     GENTAMICIN SYNERGY SENSITIVE Sensitive     * ENTEROCOCCUS FAECALIS  Blood Culture ID Panel (Reflexed)     Status: Abnormal   Collection Time: 01/18/20 10:17 AM  Result Value Ref Range Status   Enterococcus faecalis DETECTED (A) NOT DETECTED Final    Comment: CRITICAL RESULT CALLED TO, READ BACK BY AND VERIFIED WITH: G ABBOTT PHARMD 01/19/20 0044 JDW    Enterococcus Faecium NOT DETECTED NOT DETECTED Final   Listeria monocytogenes NOT DETECTED NOT DETECTED Final   Staphylococcus  species NOT DETECTED NOT DETECTED Final   Staphylococcus aureus (BCID) NOT DETECTED NOT DETECTED Final   Staphylococcus epidermidis NOT DETECTED NOT DETECTED Final   Staphylococcus lugdunensis NOT DETECTED NOT DETECTED Final   Streptococcus species  NOT DETECTED NOT DETECTED Final   Streptococcus agalactiae NOT DETECTED NOT DETECTED Final   Streptococcus pneumoniae NOT DETECTED NOT DETECTED Final   Streptococcus pyogenes NOT DETECTED NOT DETECTED Final   A.calcoaceticus-baumannii NOT DETECTED NOT DETECTED Final   Bacteroides fragilis NOT DETECTED NOT DETECTED Final   Enterobacterales NOT DETECTED NOT DETECTED Final   Enterobacter cloacae complex NOT DETECTED NOT DETECTED Final   Escherichia coli NOT DETECTED NOT DETECTED Final   Klebsiella aerogenes NOT DETECTED NOT DETECTED Final   Klebsiella oxytoca NOT DETECTED NOT DETECTED Final   Klebsiella pneumoniae NOT DETECTED NOT DETECTED Final   Proteus species NOT DETECTED NOT DETECTED Final   Salmonella species NOT DETECTED NOT DETECTED Final   Serratia marcescens NOT DETECTED NOT DETECTED Final   Haemophilus influenzae NOT DETECTED NOT DETECTED Final   Neisseria meningitidis NOT DETECTED NOT DETECTED Final   Pseudomonas aeruginosa NOT DETECTED NOT DETECTED Final   Stenotrophomonas maltophilia NOT DETECTED NOT DETECTED Final   Candida albicans NOT DETECTED NOT DETECTED Final   Candida auris NOT DETECTED NOT DETECTED Final   Candida glabrata NOT DETECTED NOT DETECTED Final   Candida krusei NOT DETECTED NOT DETECTED Final   Candida parapsilosis NOT DETECTED NOT DETECTED Final   Candida tropicalis NOT DETECTED NOT DETECTED Final   Cryptococcus neoformans/gattii NOT DETECTED NOT DETECTED Final   Vancomycin resistance NOT DETECTED NOT DETECTED Final    Comment: Performed at [facility], [address], [place], [place] [address]  Culture, blood (Routine X 2) w Reflex to ID Panel     Status: Abnormal   Collection Time: 01/18/20 10:30 AM    Specimen: BLOOD LEFT ARM  Result Value Ref Range Status   Specimen Description BLOOD LEFT ARM  Final   Special Requests   Final    BOTTLES DRAWN AEROBIC ONLY Blood Culture adequate volume   Culture  Setup Time   Final    AEROBIC BOTTLE ONLY GRAM POSITIVE COCCI CRITICAL VALUE NOTED.  VALUE IS CONSISTENT WITH PREVIOUSLY REPORTED AND CALLED VALUE.    Culture (A)  Final    ENTEROCOCCUS FAECALIS SUSCEPTIBILITIES PERFORMED ON PREVIOUS CULTURE WITHIN THE LAST 5 DAYS. Performed at [facility], [place] [address] [place], [place] [address]    Report Status 01/20/2020 FINAL  Final  Culture, blood (routine x 2)     Status: None   Collection Time: 01/21/20  6:07 PM   Specimen: BLOOD LEFT HAND  Result Value Ref Range Status   Specimen Description BLOOD LEFT HAND  Final   Special Requests   Final    BOTTLES DRAWN AEROBIC AND ANAEROBIC Blood Culture adequate volume   Culture   Final    NO GROWTH 5 DAYS Performed at [facility], [place] [address] [place], [place] [address]    Report Status 01/26/2020 FINAL  Final  Culture, blood (routine x 2)     Status: None   Collection Time: 01/21/20  6:14 PM   Specimen: BLOOD LEFT FOREARM  Result Value Ref Range Status   Specimen Description BLOOD LEFT FOREARM  Final   Special Requests   Final    BOTTLES DRAWN AEROBIC ONLY Blood Culture adequate volume   Culture   Final    NO GROWTH 5 DAYS Performed at [facility], [place] [address] [place], [place] [address]    Report Status 01/26/2020 FINAL  Final  Culture, blood (Routine X 2) w Reflex to ID Panel     Status: None  Collection Time: 01/25/20  1:15 PM   Specimen: BLOOD  Result Value Ref Range Status   Specimen Description BLOOD LEFT ANTECUBITAL  Final   Special Requests   Final    BOTTLES DRAWN AEROBIC AND ANAEROBIC Blood Culture adequate volume   Culture   Final    NO GROWTH 5 DAYS Performed at [facility], [address] [place], [place] [address]    Report Status  01/30/2020 FINAL  Final  Culture, blood (Routine X 2) w Reflex to ID Panel     Status: None   Collection Time: 01/25/20  1:23 PM   Specimen: BLOOD LEFT FOREARM  Result Value Ref Range Status   Specimen Description BLOOD LEFT FOREARM  Final   Special Requests   Final    BOTTLES DRAWN AEROBIC AND ANAEROBIC Blood Culture adequate volume   Culture   Final    NO GROWTH 5 DAYS Performed at [facility], [place] [address], [place], [place] [address]    Report Status 01/30/2020 FINAL  Final  SARS Coronavirus 2 by RT PCR (hospital order, performed in [facility] hospital lab) Nasopharyngeal     Status: None   Collection Time: 01/29/20  9:00 AM   Specimen: Nasopharyngeal  Result Value Ref Range Status   SARS Coronavirus 2 NEGATIVE NEGATIVE Final    Comment: (NOTE) SARS-CoV-2 target nucleic acids are NOT DETECTED.  The SARS-CoV-2 RNA is generally detectable in upper and lower respiratory specimens during the acute phase of infection. The lowest concentration of SARS-CoV-2 viral copies this assay can detect is 250 copies / mL. A negative result does not preclude SARS-CoV-2 infection and should not be used as the sole basis for treatment or other patient management decisions.  A negative result may occur with improper specimen collection / handling, submission of specimen other than nasopharyngeal swab, presence of viral mutation(s) within the areas targeted by this assay, and inadequate number of viral copies (<250 copies / mL). A negative result must be combined with clinical observations, patient history, and epidemiological information.  Fact Sheet for Patients:   StrictlyIdeas.no  Fact Sheet for Healthcare Providers: BankingDealers.co.za  This test is not yet approved or  cleared by the [place] FDA and has been authorized for detection and/or diagnosis of SARS-CoV-2 by FDA under an Emergency Use Authorization (EUA).  This EUA  will remain in effect (meaning this test can be used) for the duration of the COVID-19 declaration under Section 564(b)(1) of the Act, 21 U.S.C. section 360bbb-3(b)(1), unless the authorization is terminated or revoked sooner.  Performed at [facility], [place] [address], [place], [place] [address]   Culture, bal-quantitative     Status: Abnormal   Collection Time: 01/30/20 10:36 AM   Specimen: Bronchial Alveolar Lavage; Respiratory  Result Value Ref Range Status   Specimen Description Bronch Lavag  Final   Special Requests NONE  Final   Gram Stain   Final    ABUNDANT WBC PRESENT,BOTH PMN AND MONONUCLEAR NO ORGANISMS SEEN Performed at [facility], [address], [place], [place] [address]    Culture 10,000 COLONIES/mL CANDIDA DUBLINIENSIS (A)  Final   Report Status 02/02/2020 FINAL  Final  Culture, blood (routine x 2)     Status: None (Preliminary result)   Collection Time: 01/31/20  5:36 PM   Specimen: BLOOD RIGHT HAND  Result Value Ref Range Status   Specimen Description BLOOD RIGHT HAND  Final   Special Requests   Final  BOTTLES DRAWN AEROBIC ONLY Blood Culture results may not be optimal due to an inadequate volume of blood received in culture bottles   Culture   Final    NO GROWTH 4 DAYS Performed at [facility], [place] [address] [place], [place] [address]    Report Status PENDING  Incomplete  Culture, blood (routine x 2)     Status: None (Preliminary result)   Collection Time: 01/31/20  5:50 PM   Specimen: BLOOD RIGHT HAND  Result Value Ref Range Status   Specimen Description BLOOD RIGHT HAND  Final   Special Requests   Final    BOTTLES DRAWN AEROBIC ONLY Blood Culture results may not be optimal due to an inadequate volume of blood received in culture bottles   Culture   Final    NO GROWTH 4 DAYS Performed at [facility], [place] [address], [place], [place] [address]    Report Status PENDING  Incomplete    Coagulation Studies: Recent Labs     02/03/20 0313 02/04/20 0404 02/05/20 0304  LABPROT 13.9 14.0 13.8  INR 1.1 1.1 1.1    Urinalysis: No results for input(s): COLORURINE, LABSPEC, PHURINE, GLUCOSEU, HGBUR, BILIRUBINUR, KETONESUR, PROTEINUR, UROBILINOGEN, NITRITE, LEUKOCYTESUR in the last 72 hours.  Invalid input(s): APPERANCEUR    Imaging: DG CHEST PORT 1 VIEW  Result Date: 02/04/2020 CLINICAL DATA:  ECMO. EXAM: PORTABLE CHEST 1 VIEW COMPARISON:  February 03, 2020 FINDINGS: The ETT is in good position. The feeding tube terminates below today's film. A left central line is stable. A right PICC line is again seen although the distal tip is difficult to visualize. An ECMO device remains. Near complete opacification of the lungs remains. No other changes. A left subclavian central line is stable. IMPRESSION: 1. Support apparatus as above. 2. Near complete opacification of the lungs is stable. Electronically Signed   By: Dorise Bullion III M.D   On: 02/04/2020 10:51   DG CHEST PORT 1 VIEW  Result Date: 02/03/2020 CLINICAL DATA:  COVID positive with respiratory distress. EXAM: PORTABLE CHEST 1 VIEW COMPARISON:  February 02, 2020 FINDINGS: There is stable endotracheal tube, nasogastric tube and left internal jugular venous catheter positioning. Marked severity opacification of both lungs is seen with a very mild amount of aerated perihilar regions, bilaterally. This is very mildly improved when compared to the prior study. The heart size and mediastinal contours are subsequently limited in evaluation. A radiopaque fusion plate and screws are seen overlying the cervical spine. The visualized skeletal structures are otherwise unremarkable. IMPRESSION: Marked severity opacification of both lungs with a very mild amount of aerated perihilar regions, bilaterally. Electronically Signed   By: Virgina Norfolk M.D.   On: 02/03/2020 15:04     Medications:   .  prismasol BGK 4/2.5 300 mL/hr at 02/05/20 0221  . sodium chloride 500 mL  (02/01/20 1958)  . albumin human 12.5 g (01/28/20 1229)  . albumin human    . amiodarone 30 mg/hr (02/05/20 0600)  . anidulafungin Stopped (02/04/20 1757)  . dexmedetomidine (PRECEDEX) IV infusion 1 mcg/kg/hr (02/05/20 0600)  . feeding supplement (PIVOT 1.5 CAL) 60 mL/hr at 02/04/20 1600  . heparin 500 Units/hr (02/05/20 0600)  . HYDROmorphone 2 mg/hr (02/05/20 0600)  . insulin 11.5 mL/hr at 02/05/20 0600  . milrinone 0.125 mcg/kg/min (02/05/20 0600)  . norepinephrine (LEVOPHED) Adult infusion Stopped (02/03/20 1405)  . piperacillin-tazobactam (ZOSYN)  IV Stopped (02/05/20 0536)  . prismasol BGK 2/2.5 replacement solution 500 mL/hr at 02/05/20  0114  . prismasol BGK 4/2.5 2,000 mL/hr at 02/05/20 0455  . vasopressin Stopped (02/05/20 0407)   . sodium chloride   Intravenous Once  . sodium chloride   Intravenous Once  . vitamin C  500 mg Per Tube Daily  . B-complex with vitamin C  1 tablet Oral Daily  . chlorhexidine gluconate (MEDLINE KIT)  15 mL Mouth Rinse BID  . Chlorhexidine Gluconate Cloth  6 each Topical Daily  . clonazePAM  1 mg Per Tube Q6H  . docusate  100 mg Per Tube BID  . feeding supplement (PROSource TF)  45 mL Per Tube TID  . fentaNYL (SUBLIMAZE) injection  50 mcg Intravenous Once  . linagliptin  5 mg Per Tube Daily  . mouth rinse  15 mL Mouth Rinse 10 times per day  . oxyCODONE  10 mg Per Tube Q6H  . pantoprazole (PROTONIX) IV  40 mg Intravenous Daily  . polyethylene glycol  17 g Per Tube Daily  . sennosides  5 mL Per Tube BID  . zinc sulfate  220 mg Per Tube Daily   sodium chloride, acetaminophen (TYLENOL) oral liquid 160 mg/5 mL, albumin human, albuterol, dextrose, fentaNYL, fentaNYL (SUBLIMAZE) injection, guaiFENesin-dextromethorphan, heparin, hydrALAZINE, HYDROmorphone, hydrOXYzine, ondansetron **OR** ondansetron (ZOFRAN) IV, sodium chloride  Assessment/ Plan:  1.  Anuric AKI due to shock, sepsis causing acute tubular necrosis: Started CRRT on 8/22 for uremia  and volume overload. She has ECMO. Continue CRRT, prognosis grim.  2.Shock due to sepsis, Covid infection, Enterococcus bacteremia: On pressors, antibiotics per primary team.  3.Acute hypoxic and hypercapnic respiratory failure, ARDS due to COVID-19 pneumonia: Currently on ECMO for refractory hypoxemia, MSSA pneumonia.  On broad-spectrum antibiotics.  3.Hypernatremia, hypervolemia: relative free water deficit.  Improved with CRRT.  4.Acute toxic metabolic encephalopathy:Multifactorial with uremia contributing. Treatment with CRRT as above.  5.Anemia, thrombocytopenia: With hemolysis likely and acute illness contributing. Transfusions per primary team.  Seen by hematologist.  6.Intracranial hemorrhage: With some left-sided weakness preceded by primary team.   7. GOC: Multiorgan failure.   family meeting on 8/25, continue full scope of treatment.  Long-term prognosis poor.  8.  Diabetes as per primary service continues on oral Tradjenta.    LOS: [place] _0 _1 :42 AM

## 2020-02-05 NOTE — Procedures (Signed)
Bedside Tracheostomy Insertion Procedure Note   Patient Details:   Name: Alisha Stephenson DOB: 1965-09-16 MRN: [id number]  Procedure: Tracheostomy  Pre Procedure Assessment: ET Tube Size: 7.5 ET Tube secured at lip (cm): 23 Bite block in place: No Breath Sounds: Diminished  Post Procedure Assessment: BP (!) 196/66   Pulse 75   Temp (!) 95.4 F (35.2 C)   Resp 15   Ht 5\' 4"  (1.626 m)   Wt 122 kg   SpO2 100%   BMI 46.17 kg/m  O2 sats: stable throughout Complications: No apparent complications Patient did tolerate procedure well Tracheostomy Brand:Shiley Tracheostomy Style:Cuffed Tracheostomy Size: 8 Tracheostomy Secured QPY:[id number], velcro Tracheostomy Placement Confirmation:Trach cuff visualized and in place and Chest X ray ordered for placement    Kathie Dike 02/05/2020, 2:08 PM

## 2020-02-05 NOTE — Progress Notes (Signed)
Physical Therapy Treatment Patient Details Name: Alisha Stephenson MRN: [id number] DOB: 1966-01-23 Today's Date: 02/05/2020    History of Present Illness 54 yo admitted 8/2 with hypoxemia due to Covid 19 PNA. 8/3 intubated. 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 CRRT initiated 8/22. Reintubated 8/24 with ECMO circuit changed. PMHx: DM, diverticulitis, Asthma    PT Comments    Pt with attention to therapist and task this session noted to follow commands for moving RUE and RLE as well as visual tracking grandchild's picture from midline to right. Pt with trace activation of ankle and toes on right as well as knee flexion with PROM performed. No muscle engagement noted on left side during session. Pt squeezing right hand and activating elbow movement with trace shoulder activation. Pt communicating with facial movement like for country music and dislike for rap. Pt with grossly 5-10 sec delay with commands.  ECMO specialist present throughout with assist for managing ECMO and CRRT as well as vent. Will continue to follow to maximize function.   HR 70-80 SpO2 97-100%, pressure control , peep 8, FiO2 30% BP 88/55   Follow Up Recommendations  LTACH     Equipment Recommendations  Other (comment) (TBD)    Recommendations for Other Services       Precautions / Restrictions Precautions Precautions: Other (comment) Precaution Comments: ECMO with RIJ access anchors at right shoulder and chest, necrotic thumb and index finger, bruised/necrotic left hand, CRRT left subclavian, ETT Required Braces or Orthoses: Other Brace Other Brace: Prevalon Boots    Mobility  Bed Mobility               General bed mobility comments: total A for sliding up and down the bed. attempted to tilt bed for standing however tilt function not working. performed reverse trendelenburg to 15degrees to put weight on legs with pt able to push into foot board with  RLE  Transfers                 General transfer comment: unable to attempt  Ambulation/Gait                 Stairs             Wheelchair Mobility    Modified Rankin (Stroke Patients Only)       Balance                                            Cognition Arousal/Alertness: Awake/alert Behavior During Therapy: Flat affect Overall Cognitive Status: Difficult to assess Area of Impairment: Following commands                       Following Commands: Follows one step commands inconsistently       General Comments: frequently falling asleep during session, would awake to calling her name, Pt did respond to questions through non-verbal communication (nods, wrinkles eyebrows, smiles) but inconsistent      Exercises General Exercises - Upper Extremity Shoulder Flexion: AAROM;Both;10 reps;Supine (LUE PROM; completed within restrictions of ECMO cannula) Elbow Flexion: PROM;15 reps;Both;Supine Elbow Extension: PROM;15 reps;Both;Supine Wrist Flexion: PROM;Supine;15 reps;Right (no L wrist due to A line placement) Wrist Extension: PROM;Supine;15 reps;Right (no L wrist due to A line placement) Digit Composite Flexion: Both;Supine;15 reps;PROM;AAROM (R hand with 3/5 assist for full ROM) Composite Extension: Both;15  reps;Supine;PROM (R hand with 3/5 assist for full ROM) General Exercises - Lower Extremity Ankle Circles/Pumps: Both;Supine;PROM;10 reps Heel Slides: PROM;Both;Supine;10 reps Hip ABduction/ADduction: PROM;Both;Supine;10 reps    General Comments General comments (skin integrity, edema, etc.): VSS throughout session, RN and respiratory present to assist with lines and monitor vitals      Pertinent Vitals/Pain Pain Assessment: No/denies pain Faces Pain Scale: No hurt Pain Intervention(s): Monitored during session;Repositioned;Other (comment) (PROM to address swelling)    Home Living                      Prior  Function            PT Goals (current goals can now be found in the care plan section) Acute Rehab PT Goals Patient Stated Goal: pt unable to state Time For Goal Achievement: 02/19/20 Potential to Achieve Goals: Fair Progress towards PT goals: Progressing toward goals (goals remain appropriate given extreme medical complexity)    Frequency    Min 2X/week      PT Plan Current plan remains appropriate    Co-evaluation PT/OT/SLP Co-Evaluation/Treatment: Yes Reason for Co-Treatment: Complexity of the patient's impairments (multi-system involvement) PT goals addressed during session: Strengthening/ROM OT goals addressed during session: ADL's and self-care;Strengthening/ROM      AM-PAC PT "6 Clicks" Mobility   Outcome Measure  Help needed turning from your back to your side while in a flat bed without using bedrails?: Total Help needed moving from lying on your back to sitting on the side of a flat bed without using bedrails?: Total Help needed moving to and from a bed to a chair (including a wheelchair)?: Total Help needed standing up from a chair using your arms (e.g., wheelchair or bedside chair)?: Total Help needed to walk in hospital room?: Total Help needed climbing 3-5 steps with a railing? : Total 6 Click Score: 6    End of Session   Activity Tolerance: Patient tolerated treatment well Patient left: in bed Nurse Communication: Mobility status;Need for lift equipment PT Visit Diagnosis: Other abnormalities of gait and mobility (R26.89);Muscle weakness (generalized) (M62.81)     Time: 6301-6010 PT Time Calculation (min) (ACUTE ONLY): 40 min  Charges:  $Therapeutic Activity: 8-22 mins                     Alisha Stephenson P, PT Acute Rehabilitation Services Pager: [phone] Office: [place] 02/05/2020, 1:35 PM

## 2020-02-05 NOTE — Progress Notes (Signed)
Occupational Therapy Treatment Patient Details Name: Alisha Stephenson MRN: [id number] DOB: 14-Dec-1965 Today's Date: 02/05/2020    History of present illness 54 yo admitted 8/2 with hypoxemia due to Covid 19 PNA. 8/3 intubated. 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 CRRT initiated 8/22. Reintubated 8/24 with ECMO circuit changed. PMHx: DM, diverticulitis, Asthma   OT comments  Pt improved since last therapy treatment session, Pt responding with non-verbal communication 33% of the time to single step commands and questions. Pt tracking to right and midline (not past on L) with photo of grandson. Flaccid L arm and leg currently, R side demonstrating more strength and movement (continues to be very limited - but able to move minimally against gravity) BUE remain edemous. Kregg bed unable to tilt today (malfunction of equipment) and so utilized reverse trendelenburg to "tilt" patient to 15 degrees for WB through BLE. RN and respiratory therapist present throughout to assist with lines and monitor vitals. VSS throughout session - including positional changes. Current POC remains appropriate and OT will continue to follow acutely. Pt is planned for trach later today.   Follow Up Recommendations  LTACH;SNF;Other (comment) (post-acute rehab)    Equipment Recommendations  Other (comment) (TBD)    Recommendations for Other Services      Precautions / Restrictions Precautions Precautions: Other (comment) Precaution Comments: ECMO with RIJ access anchors at right shoulder and chest, necrotic thumb and index finger, bruised/necrotic left hand, CRRT left subclavian Required Braces or Orthoses: Other Brace Other Brace: Prevalon Boots       Mobility Bed Mobility               General bed mobility comments: total A for sliding up and down the bed  Transfers                 General transfer comment: unable to attempt    Balance                                            ADL either performed or assessed with clinical judgement   ADL Overall ADL's : Needs assistance/impaired                                       General ADL Comments: currently totalA     Vision   Vision Assessment?: Vision impaired- to be further tested in functional context Additional Comments: Pt will track to the right, but not past midline (used photo of grandson)   Environmental education officer      Cognition Arousal/Alertness: Awake/alert Behavior During Therapy: Flat affect Overall Cognitive Status: Impaired/Different from baseline Area of Impairment: Following commands                       Following Commands: Follows one step commands inconsistently       General Comments: frequently falling asleep during session, would awake to calling her name, Pt did respond to questions through non-verbal communication (nods, sticks out tongue, smiles) but inconsistent        Exercises Exercises: General Upper Extremity General Exercises - Upper Extremity Shoulder Flexion: AAROM;Both;10 reps;Supine (LUE PROM; completed within restrictions of ECMO cannula) Elbow Flexion: PROM;15 reps;Both;Supine Elbow Extension: PROM;15 reps;Both;Supine Wrist Flexion: PROM;Supine;15 reps;Right (  no L wrist due to A line placement) Wrist Extension: PROM;Supine;15 reps;Right (no L wrist due to A line placement) Digit Composite Flexion: Both;Supine;15 reps;PROM;AAROM (R hand with 3/5 assist for full ROM) Composite Extension: Both;15 reps;Supine;PROM (R hand with 3/5 assist for full ROM)   Shoulder Instructions       General Comments VSS throughout session, RN and respiratory present to assist with lines and monitor vitals    Pertinent Vitals/ Pain       Pain Assessment: Faces Faces Pain Scale: No hurt Pain Intervention(s): Monitored during session;Repositioned;Other (comment) (PROM to address swelling)  Home Living                                           Prior Functioning/Environment              Frequency  Min 2X/week        Progress Toward Goals  OT Goals(current goals can now be found in the care plan section)  Progress towards OT goals: Progressing toward goals  Acute Rehab OT Goals Patient Stated Goal: pt unable to state OT Goal Formulation: Patient unable to participate in goal setting Time For Goal Achievement: 02/06/20 Potential to Achieve Goals: [place] Discharge plan remains appropriate    Co-evaluation    PT/OT/SLP Co-Evaluation/Treatment: Yes Reason for Co-Treatment: Complexity of the patient's impairments (multi-system involvement);Necessary to address cognition/behavior during functional activity;For patient/therapist safety;Other (comment) (for Kreg Bed) PT goals addressed during session: Balance;Strengthening/ROM OT goals addressed during session: ADL's and self-care;Strengthening/ROM      AM-PAC OT "6 Clicks" Daily Activity     Outcome Measure   Help from another person eating meals?: Total Help from another person taking care of personal grooming?: Total Help from another person toileting, which includes using toliet, bedpan, or urinal?: Total Help from another person bathing (including washing, rinsing, drying)?: Total Help from another person to put on and taking off regular upper body clothing?: Total Help from another person to put on and taking off regular lower body clothing?: Total 6 Click Score: 6    End of Session Equipment Utilized During Treatment: Oxygen  OT Visit Diagnosis: Other abnormalities of gait and mobility (R26.89);Muscle weakness (generalized) (M62.81);Other symptoms and signs involving cognitive function   Activity Tolerance Patient tolerated treatment well;Patient limited by lethargy (limited due to medical condition)   Patient Left in bed;with call bell/phone within reach;Other (comment);with nursing/sitter in  room (modified chair position)   Nurse Communication Mobility status;Precautions        Time: 1287-8676 OT Time Calculation (min): 41 min  Charges: OT General Charges $OT Visit: 1 Visit OT Treatments $Therapeutic Activity: 8-22 mins  Jesse Sans OTR/L Acute Rehabilitation Services Pager: [phone] Office: [place] 02/05/2020, 12:56 PM

## 2020-02-05 NOTE — Procedures (Signed)
Extracorporeal support note   ECLS support day: 21 Indication: Covid 19 pneumonia/ARDS  Configuration: VV  Drainage cannula: right IJ [place]  Return cannula: same  Pump speed: 4200 Pump flow: 5.04 Pump used: Centrimag  Oxygenator: Quadrox O2 blender: 100% Sweep gas: 12  Circuit check: no visible clot Anticoagulant: heparin  Anticoagulation targets: ptt 40-60  Changes in support: no change in support   Anticipated goals/duration of support: bridge to recovery.   Erskine Emery MD PCCM

## 2020-02-05 NOTE — Procedures (Signed)
Diagnostic Bronchoscopy Procedure Note   BROOKSIE ELLWANGER  [id number]  Jan 06, 1966  Date:02/05/20  Time:1:42 PM   Provider Performing:Canary Fister Shearon Stalls  Procedure: Diagnostic Bronchoscopy (18984)  Indication(s) Assist with direct visualization of tracheostomy placement.  Consent Risks of the procedure as well as the alternatives and risks of each were explained to the patient and/or caregiver.  Consent for the procedure was obtained.   Anesthesia See separate tracheostomy note   Time Out Verified patient identification, verified procedure, site/side was marked, verified correct patient position, special equipment/implants available, medications/allergies/relevant history reviewed, required imaging and test results available.   Sterile Technique Usual hand hygiene, masks, gowns, and gloves were used   Procedure Description Bronchoscope advanced through endotracheal tube and into airway.  After suctioning out tracheal secretions, bronchoscope used to provide direct visualization of tracheostomy placement.   Complications/Tolerance None; patient tolerated the procedure well..   EBL None   Specimen(s) None   Montey Hora, [place] [facility] 02/05/2020, 1:42 PM

## 2020-02-05 NOTE — Progress Notes (Signed)
[place] for Infectious Disease  Date of Admission:  01/19/2020     Total days of antibiotics 18         ASSESSMENT:  Ms. Mannes remains off pressors and opens her eyes to voice while continuing with ECMO and CRRT. Blood cultures from 01/2520 without growth to date and BAL with Candida dubliniensis. No clear source for enterococcal infection. Question if possible related to previous lines as she has had improvement since circuit was changed. Recommend continuing high dose Zoysn for a total of 14 days post cathter exchange (until 9/7) and 7 days of Eraxis (until 9/2). Continue with ECMO and CRRT per CCM and nephrology. ID will sign off for now and be available as needed.   PLAN:  1. Continue with Zoysn through 9/7 for enterococcal infection. 2. Continue with Eraxis through 9/2 for Candida Dublinensis infection.  3. ECMO and CRRT per CCM and nephrology.   Please re-consult if needed.  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, managed as type 1 ([place])   Pneumonia due to COVID-19 virus   Intracerebral hemorrhage   Personal history of ECMO   Palliative care by specialist   Goals of care, counseling/discussion    sodium chloride   Intravenous Once   sodium chloride   Intravenous Once   vitamin C  500 mg Per Tube Daily   B-complex with vitamin C  1 tablet Oral Daily   chlorhexidine gluconate (MEDLINE KIT)  15 mL Mouth Rinse BID   Chlorhexidine Gluconate Cloth  6 each Topical Daily   clonazePAM  1 mg Per Tube Q6H   docusate  100 mg Per Tube BID   feeding supplement (PROSource TF)  45 mL Per Tube TID   fentaNYL (SUBLIMAZE) injection  50 mcg Intravenous Once   linagliptin  5 mg Per Tube Daily   mouth rinse  15 mL Mouth Rinse 10 times per day   oxyCODONE  10 mg Per Tube Q6H   pantoprazole (PROTONIX) IV  40 mg Intravenous  Daily   polyethylene glycol  17 g Per Tube Daily   sennosides  5 mL Per Tube BID   zinc sulfate  220 mg Per Tube Daily    SUBJECTIVE:  No acute events overnight. Will open eyes to voice. Off pressors. t  Allergies  Allergen Reactions   Sulfa Antibiotics Rash and Other (See Comments)     Review of Systems: Review of Systems  Unable to perform ROS: Intubated    OBJECTIVE: Vitals:   02/05/20 0700 02/05/20 0738 02/05/20 0800 02/05/20 0900  BP:      Pulse: 78  83 70  Resp:      Temp: (!) 96.4 F (35.8 C)  (!) 96.6 F (35.9 C) (!) 94.5 F (34.7 C)  TempSrc:      SpO2: 100% 95% 97% 100%  Weight:      Height:       Body mass index is 46.17 kg/m.  Physical Exam Constitutional:      General: She is not in acute distress.    Appearance: She is obese. She is ill-appearing.     Comments: Lying in bed with warming blanket, ECMO and CRRT. Opens eyes to voice.  Cardiovascular:     Rate and Rhythm: Normal rate and regular rhythm.     Heart sounds: Normal heart sounds.  Comments: Catheter sites appear without evidence of infection.  Skin:    General: Skin is warm and dry.     Lab Results Lab Results  Component Value Date   WBC 10.8 (H) 02/05/2020   HGB 8.2 (L) 02/05/2020   HCT 24.0 (L) 02/05/2020   MCV 104.4 (H) 02/05/2020   PLT 60 (L) 02/05/2020    Lab Results  Component Value Date   CREATININE 1.08 (H) 02/05/2020   BUN 44 (H) 02/05/2020   NA 138 02/05/2020   K 4.8 02/05/2020   CL 102 02/05/2020   CO2 23 02/05/2020    Lab Results  Component Value Date   ALT 151 (H) 02/05/2020   AST 73 (H) 02/05/2020   ALKPHOS 264 (H) 02/05/2020   BILITOT 1.6 (H) 02/05/2020     Microbiology: Recent Results (from the past 240 hour(s))  SARS Coronavirus 2 by RT PCR (hospital order, performed in [facility] hospital lab) Nasopharyngeal     Status: None   Collection Time: 01/29/20  9:00 AM   Specimen: Nasopharyngeal  Result Value Ref Range Status   SARS  Coronavirus 2 NEGATIVE NEGATIVE Final    Comment: (NOTE) SARS-CoV-2 target nucleic acids are NOT DETECTED.  The SARS-CoV-2 RNA is generally detectable in upper and lower respiratory specimens during the acute phase of infection. The lowest concentration of SARS-CoV-2 viral copies this assay can detect is 250 copies / mL. A negative result does not preclude SARS-CoV-2 infection and should not be used as the sole basis for treatment or other patient management decisions.  A negative result may occur with improper specimen collection / handling, submission of specimen other than nasopharyngeal swab, presence of viral mutation(s) within the areas targeted by this assay, and inadequate number of viral copies (<250 copies / mL). A negative result must be combined with clinical observations, patient history, and epidemiological information.  Fact Sheet for Patients:   StrictlyIdeas.no  Fact Sheet for Healthcare Providers: BankingDealers.co.za  This test is not yet approved or  cleared by the [place] FDA and has been authorized for detection and/or diagnosis of SARS-CoV-2 by FDA under an Emergency Use Authorization (EUA).  This EUA will remain in effect (meaning this test can be used) for the duration of the COVID-19 declaration under Section 564(b)(1) of the Act, 21 U.S.C. section 360bbb-3(b)(1), unless the authorization is terminated or revoked sooner.  Performed at [facility], [place] [address] [place], [place] [address]   Culture, bal-quantitative     Status: Abnormal   Collection Time: 01/30/20 10:36 AM   Specimen: Bronchial Alveolar Lavage; Respiratory  Result Value Ref Range Status   Specimen Description Bronch Lavag  Final   Special Requests NONE  Final   Gram Stain   Final    ABUNDANT WBC PRESENT,BOTH PMN AND MONONUCLEAR NO ORGANISMS SEEN Performed at [facility], [address] [place], [place] [address]     Culture 10,000 COLONIES/mL CANDIDA DUBLINIENSIS (A)  Final   Report Status 02/02/2020 FINAL  Final  Culture, blood (routine x 2)     Status: None   Collection Time: 01/31/20  5:36 PM   Specimen: BLOOD RIGHT HAND  Result Value Ref Range Status   Specimen Description BLOOD RIGHT HAND  Final   Special Requests   Final    BOTTLES DRAWN AEROBIC ONLY Blood Culture results may not be optimal due to an inadequate volume of blood received in culture bottles   Culture   Final  NO GROWTH 5 DAYS Performed at [facility], [place] [address] [place], [place] [address]    Report Status 02/05/2020 FINAL  Final  Culture, blood (routine x 2)     Status: None   Collection Time: 01/31/20  5:50 PM   Specimen: BLOOD RIGHT HAND  Result Value Ref Range Status   Specimen Description BLOOD RIGHT HAND  Final   Special Requests   Final    BOTTLES DRAWN AEROBIC ONLY Blood Culture results may not be optimal due to an inadequate volume of blood received in culture bottles   Culture   Final    NO GROWTH 5 DAYS Performed at [facility], [place] [address], [place], [place] [address]    Report Status 02/05/2020 FINAL  Final     Terri Piedra, NP [facility]ious [facility]  02/05/2020  9:16 AM

## 2020-02-05 NOTE — Progress Notes (Signed)
ANTICOAGULATION CONSULT NOTE - [place] for Heparin Indication: ECMO  Allergies  Allergen Reactions  . Sulfa Antibiotics Rash and Other (See Comments)    Patient Measurements: Height: 5\' 4"  (162.6 cm) Weight: 122 kg (268 lb 15.4 oz) IBW/kg (Calculated) : 54.7 Heparin Dosing Weight: 87 kg  Vital Signs: Temp: 96.4 F (35.8 C) (08/30 0700) Temp Source: Esophageal (08/30 0400) Pulse Rate: 78 (08/30 0700)  Labs: Recent Labs    02/03/20 0313 02/03/20 0313 02/03/20 0437 02/04/20 0404 02/04/20 0406 02/04/20 0836 02/04/20 1548 02/04/20 2010 02/05/20 0304 02/05/20 0304 02/05/20 0403 02/05/20 0625  HGB 8.5*  --    < > 8.5*  --    < > 8.9*   < > 8.1*   < > 8.2* 8.2*  HCT 27.1*  --    < > 27.1*  --    < > 28.5*   < > 26.0*  --  24.0* 24.0*  PLT 61*  --    < > 61*  --   --  72*  --  60*  --   --   --   APTT 40*  --    < > 38*  --   --  40*  --  41*  --   --   --   LABPROT 13.9  --   --  14.0  --   --   --   --  13.8  --   --   --   INR 1.1  --   --  1.1  --   --   --   --  1.1  --   --   --   HEPARINUNFRC <0.10*   < >  --   --  <0.10*  --  <0.10*  --  <0.10*  --   --   --   CREATININE 1.15*  --    < > 1.10*  --   --  1.01*  --  1.08*  --   --   --    < > = values in this interval not displayed.    Estimated Creatinine Clearance: 75.8 mL/min (A) (by C-G formula based on SCr of 1.08 mg/dL (H)).   Medical History: Past Medical History:  Diagnosis Date  . Asthma   . Diabetes mellitus without complication ([place])   . Diverticulitis   . Gallstones   . IBS (irritable bowel syndrome)   . NAFLD (nonalcoholic fatty liver disease)    CT scan 2015  . Ovarian cyst     Assessment: 54 yo female who is COVID+ on VV ECMO. Patient was started on bivalirudin but was found to have multiple ICH on 8/13 head CT. Bivalirudin was stopped at this time and pt has been off anticoagulation. Her ECMO circuit was changed 8/24, and pharmacy asked to start low-fixed rate  heparin.  Heparin level remains undetectable on fixed, aPTT remains subtherapeutic at 41, on low-dose heparin 500 units/hr. LDH down to 766. Fibrinogen up slightly 756. Hgb 8.1, plts down to 60 this morning. No bleeding or infusion issues per discussion with RN. ECMO circuit is stable.  Planning for trach placement at 1300 today. Will hold heparin at 1100 and f/u restarting post-trach.  Goal of Therapy:  Heparin level 0.2-0.5 units/ml - currently not titrating to goal per discussion with ECMO team Monitor platelets by anticoagulation protocol: Yes   Plan:  Continue heparin infusion at 500 units/hr Hold heparin at 1100 this morning and f/u restart  post-trach  Check daily heparin level and aPTT Monitor daily CBC, signs of bleeding, and ECMO circuit  Richardine Service, PharmD PGY2 Cardiology Pharmacy Resident Phone: [phone] 02/05/2020  8:12 AM  Please check [email or web address] for unit-specific pharmacy phone numbers.

## 2020-02-06 ENCOUNTER — Inpatient Hospital Stay (HOSPITAL_COMMUNITY): Payer: PRIVATE HEALTH INSURANCE

## 2020-02-06 DIAGNOSIS — R092 Respiratory arrest: Secondary | ICD-10-CM

## 2020-02-06 LAB — POCT I-STAT 7, (LYTES, BLD GAS, ICA,H+H)
Acid-Base Excess: 3 mmol/L — ABNORMAL HIGH (ref 0.0–2.0)
Acid-Base Excess: 1 mmol/L (ref 0.0–2.0)
Acid-Base Excess: 0 mmol/L (ref 0.0–2.0)
Acid-base deficit: 2 mmol/L (ref 0.0–2.0)
Bicarbonate: 28 mmol/L (ref 20.0–28.0)
Bicarbonate: 23.9 mmol/L (ref 20.0–28.0)
Bicarbonate: 26.6 mmol/L (ref 20.0–28.0)
Bicarbonate: 25.2 mmol/L (ref 20.0–28.0)
Calcium, Ion: 1.14 mmol/L — ABNORMAL LOW (ref 1.15–1.40)
Calcium, Ion: 1.14 mmol/L — ABNORMAL LOW (ref 1.15–1.40)
Calcium, Ion: 1.16 mmol/L (ref 1.15–1.40)
Calcium, Ion: 1.1 mmol/L — ABNORMAL LOW (ref 1.15–1.40)
HCT: 32 % — ABNORMAL LOW (ref 36.0–46.0)
HCT: 29 % — ABNORMAL LOW (ref 36.0–46.0)
HCT: 27 % — ABNORMAL LOW (ref 36.0–46.0)
HCT: 26 % — ABNORMAL LOW (ref 36.0–46.0)
Hemoglobin: 10.9 g/dL — ABNORMAL LOW (ref 12.0–15.0)
Hemoglobin: 9.9 g/dL — ABNORMAL LOW (ref 12.0–15.0)
Hemoglobin: 9.2 g/dL — ABNORMAL LOW (ref 12.0–15.0)
Hemoglobin: 8.8 g/dL — ABNORMAL LOW (ref 12.0–15.0)
O2 Saturation: 96 %
O2 Saturation: 95 %
O2 Saturation: 99 %
O2 Saturation: 93 %
Patient temperature: 97.5
Patient temperature: 36.2
Patient temperature: 37
Patient temperature: 96.4
Potassium: 4.2 mmol/L (ref 3.5–5.1)
Potassium: 4.1 mmol/L (ref 3.5–5.1)
Potassium: 4.9 mmol/L (ref 3.5–5.1)
Potassium: 4.3 mmol/L (ref 3.5–5.1)
Sodium: 137 mmol/L (ref 135–145)
Sodium: 138 mmol/L (ref 135–145)
Sodium: 138 mmol/L (ref 135–145)
Sodium: 137 mmol/L (ref 135–145)
TCO2: 29 mmol/L (ref 22–32)
TCO2: 25 mmol/L (ref 22–32)
TCO2: 28 mmol/L (ref 22–32)
TCO2: 26 mmol/L (ref 22–32)
pCO2 arterial: 43.5 mmHg (ref 32.0–48.0)
pCO2 arterial: 42.3 mmHg (ref 32.0–48.0)
pCO2 arterial: 45.7 mmHg (ref 32.0–48.0)
pCO2 arterial: 37.8 mmHg (ref 32.0–48.0)
pH, Arterial: 7.415 (ref 7.350–7.450)
pH, Arterial: 7.357 (ref 7.350–7.450)
pH, Arterial: 7.373 (ref 7.350–7.450)
pH, Arterial: 7.426 (ref 7.350–7.450)
pO2, Arterial: 79 mmHg — ABNORMAL LOW (ref 83.0–108.0)
pO2, Arterial: 75 mmHg — ABNORMAL LOW (ref 83.0–108.0)
pO2, Arterial: 141 mmHg — ABNORMAL HIGH (ref 83.0–108.0)
pO2, Arterial: 61 mmHg — ABNORMAL LOW (ref 83.0–108.0)

## 2020-02-06 LAB — CBC
HCT: 30.3 % — ABNORMAL LOW (ref 36.0–46.0)
HCT: 26.7 % — ABNORMAL LOW (ref 36.0–46.0)
Hemoglobin: 9.5 g/dL — ABNORMAL LOW (ref 12.0–15.0)
Hemoglobin: 8.3 g/dL — ABNORMAL LOW (ref 12.0–15.0)
MCH: 33.1 pg (ref 26.0–34.0)
MCH: 32.5 pg (ref 26.0–34.0)
MCHC: 31.4 g/dL (ref 30.0–36.0)
MCHC: 31.1 g/dL (ref 30.0–36.0)
MCV: 105.6 fL — ABNORMAL HIGH (ref 80.0–100.0)
MCV: 104.7 fL — ABNORMAL HIGH (ref 80.0–100.0)
Platelets: 62 10*3/uL — ABNORMAL LOW (ref 150–400)
Platelets: 56 10*3/uL — ABNORMAL LOW (ref 150–400)
RBC: 2.87 MIL/uL — ABNORMAL LOW (ref 3.87–5.11)
RBC: 2.55 MIL/uL — ABNORMAL LOW (ref 3.87–5.11)
RDW: 26.5 % — ABNORMAL HIGH (ref 11.5–15.5)
RDW: 26.9 % — ABNORMAL HIGH (ref 11.5–15.5)
WBC: 11.3 10*3/uL — ABNORMAL HIGH (ref 4.0–10.5)
WBC: 10.4 10*3/uL (ref 4.0–10.5)
nRBC: 6.2 % — ABNORMAL HIGH (ref 0.0–0.2)
nRBC: 4.6 % — ABNORMAL HIGH (ref 0.0–0.2)

## 2020-02-06 LAB — BPAM PLATELET PHERESIS
Blood Product Expiration Date: 202108302359
ISSUE DATE / TIME: 202108301132
Unit Type and Rh: 6200

## 2020-02-06 LAB — LACTIC ACID, PLASMA
Lactic Acid, Venous: 1.7 mmol/L (ref 0.5–1.9)
Lactic Acid, Venous: 1.2 mmol/L (ref 0.5–1.9)

## 2020-02-06 LAB — BASIC METABOLIC PANEL
Anion gap: 10 (ref 5–15)
Anion gap: 11 (ref 5–15)
BUN: 39 mg/dL — ABNORMAL HIGH (ref 6–20)
BUN: 35 mg/dL — ABNORMAL HIGH (ref 6–20)
CO2: 24 mmol/L (ref 22–32)
CO2: 25 mmol/L (ref 22–32)
Calcium: 8 mg/dL — ABNORMAL LOW (ref 8.9–10.3)
Calcium: 8.2 mg/dL — ABNORMAL LOW (ref 8.9–10.3)
Chloride: 101 mmol/L (ref 98–111)
Chloride: 101 mmol/L (ref 98–111)
Creatinine, Ser: 0.91 mg/dL (ref 0.44–1.00)
Creatinine, Ser: 0.79 mg/dL (ref 0.44–1.00)
GFR calc Af Amer: >60 mL/min (ref >60)
GFR calc Af Amer: >60 mL/min (ref >60)
GFR calc non Af Amer: >60 mL/min (ref >60)
GFR calc non Af Amer: >60 mL/min (ref >60)
Glucose, Bld: 187 mg/dL — ABNORMAL HIGH (ref 70–99)
Glucose, Bld: 128 mg/dL — ABNORMAL HIGH (ref 70–99)
Potassium: 3.7 mmol/L (ref 3.5–5.1)
Potassium: 4.1 mmol/L (ref 3.5–5.1)
Sodium: 135 mmol/L (ref 135–145)
Sodium: 137 mmol/L (ref 135–145)

## 2020-02-06 LAB — GLOBAL TEG PANEL
CFF Max Amplitude: 36.5 mm — ABNORMAL HIGH (ref 15–32)
CK with Heparinase (R): 5.9 min (ref 4.3–8.3)
Citrated Functional Fibrinogen: 666.1 mg/dL — ABNORMAL HIGH (ref 278–581)
Citrated Kaolin (K): 0.8 min (ref 0.8–2.1)
Citrated Kaolin (MA): 66.7 mm (ref 52–69)
Citrated Kaolin (R): 6.9 min (ref 4.6–9.1)
Citrated Kaolin Angle: 79.2 deg — ABNORMAL HIGH (ref 63–78)
Citrated Rapid TEG (MA): 64.5 mm (ref 52–70)

## 2020-02-06 LAB — GLUCOSE, CAPILLARY
Glucose-Capillary: 181 mg/dL — ABNORMAL HIGH (ref 70–99)
Glucose-Capillary: 160 mg/dL — ABNORMAL HIGH (ref 70–99)
Glucose-Capillary: 182 mg/dL — ABNORMAL HIGH (ref 70–99)
Glucose-Capillary: 201 mg/dL — ABNORMAL HIGH (ref 70–99)
Glucose-Capillary: 189 mg/dL — ABNORMAL HIGH (ref 70–99)
Glucose-Capillary: 181 mg/dL — ABNORMAL HIGH (ref 70–99)
Glucose-Capillary: 197 mg/dL — ABNORMAL HIGH (ref 70–99)
Glucose-Capillary: 151 mg/dL — ABNORMAL HIGH (ref 70–99)
Glucose-Capillary: 137 mg/dL — ABNORMAL HIGH (ref 70–99)
Glucose-Capillary: 116 mg/dL — ABNORMAL HIGH (ref 70–99)
Glucose-Capillary: 174 mg/dL — ABNORMAL HIGH (ref 70–99)
Glucose-Capillary: 197 mg/dL — ABNORMAL HIGH (ref 70–99)
Glucose-Capillary: 178 mg/dL — ABNORMAL HIGH (ref 70–99)
Glucose-Capillary: 188 mg/dL — ABNORMAL HIGH (ref 70–99)
Glucose-Capillary: 176 mg/dL — ABNORMAL HIGH (ref 70–99)

## 2020-02-06 LAB — PREPARE PLATELET PHERESIS: Unit division: 0

## 2020-02-06 LAB — HEPARIN LEVEL (UNFRACTIONATED): Heparin Unfractionated: <0.1 IU/mL — ABNORMAL LOW (ref 0.30–0.70)

## 2020-02-06 LAB — APTT
aPTT: 38 seconds — ABNORMAL HIGH (ref 24–36)
aPTT: 40 seconds — ABNORMAL HIGH (ref 24–36)

## 2020-02-06 LAB — MAGNESIUM: Magnesium: 2.5 mg/dL — ABNORMAL HIGH (ref 1.7–2.4)

## 2020-02-06 LAB — PREPARE RBC (CROSSMATCH)

## 2020-02-06 LAB — PHOSPHORUS: Phosphorus: 3.1 mg/dL (ref 2.5–4.6)

## 2020-02-06 MED ORDER — SODIUM CHLORIDE 0.9 % IV SOLN: 0.3000 ug/kg | Freq: Once | INTRAVENOUS | Status: DC

## 2020-02-06 MED ORDER — PHENYLEPHRINE 40 MCG/ML (10ML) SYRINGE FOR IV PUSH (FOR BLOOD PRESSURE SUPPORT)
PREFILLED_SYRINGE | INTRAVENOUS | Status: AC
  Filled 2020-02-06: qty 10

## 2020-02-06 MED ORDER — ATROPINE SULFATE 1 MG/10ML IJ SOSY
PREFILLED_SYRINGE | INTRAMUSCULAR | Status: AC
  Filled 2020-02-06: qty 10

## 2020-02-06 MED ORDER — LIDOCAINE HCL 1 % IJ SOLN
10.0000 mL | Freq: Once | INTRAMUSCULAR | Status: AC
  Administered 2020-02-06: 10 mL via INTRADERMAL
  Filled 2020-02-06: qty 10

## 2020-02-06 MED ORDER — SODIUM CHLORIDE 0.9 % IV SOLN
36.0000 ug | Freq: Once | INTRAVENOUS | Status: DC
  Filled 2020-02-06: qty 9

## 2020-02-06 MED ORDER — SODIUM CHLORIDE 0.9% IV SOLUTION: Freq: Once | INTRAVENOUS | Status: AC

## 2020-02-06 MED ORDER — PIVOT 1.5 CAL PO LIQD
1000.0000 mL | ORAL | Status: DC
  Administered 2020-02-06 – 2020-02-13 (×7): 1000 mL
  Filled 2020-02-06 (×4): qty 1000

## 2020-02-06 MED ORDER — CALCIUM GLUCONATE-NACL 1-0.675 GM/50ML-% IV SOLN
1.0000 g | Freq: Once | INTRAVENOUS | Status: AC
  Administered 2020-02-06: 1000 mg via INTRAVENOUS
  Filled 2020-02-06: qty 50

## 2020-02-06 NOTE — Progress Notes (Signed)
NAME:  Alisha Stephenson, MRN:  [id number], DOB:  February 22, 1966, LOS: 11 ADMISSION DATE:  01/19/2020, CONSULTATION DATE:  8/3 REFERRING MD:  Alfredia Ferguson, CHIEF COMPLAINT:  Dyspnea   Brief History   54 y/o female admitted on 8/2 with severe acute respiratory failure with hypoxemia due to COVID 19 pneumonia.  She developed symptoms 1 week prior to admission.  Past Medical History  DM2 Diverticulitis Gallstones Ovarian cyst NAFLD Asthma  Significant Hospital Events   8/2 admit 8/3 ICU transfer, intubated 8/4 prone, paralyze 8/9 significant desaturations today 8/9 VV ECMO cannulation 8/13 ICH 8/30 trach  Consults:  PCCM ECMO team   Procedures:  8/3 ETT > 8/30 8/30 8-0 shiley trach 8/3 PICC >  8/9 LIJ MML 8/9 RIJ [place] 34F   Significant Diagnostic Tests:  7/31 CT head > NAICP 7/31 MRI/MRA brain > no acute changes, possibly small aneurysm ACOM 8/13 CT head> multiple areas of ICH  Micro Data:  8/2 blood > NG 8/2 SARS COV 2 > positive 8/4 resp > negative 8/4 urine >  8/12 blood > E. faecalis (pan-sensitive) 8/12 resp: staph aureus> MSSA 8/25 blood>> NG 8/24 BAL> yeast  Antimicrobials:  See fever tab for past abx Current Eraxis 8/26>> Zosyn 8/23>>  Interim history/subjective:  No events.  Had 1 unit pRBC for blood lost in CRRT circuit overnight.   Objective   Blood pressure (!) 157/70, pulse 85, temperature (!) 97.5 F (36.4 C), temperature source Oral, resp. rate 19, height _0  (1.626 m), weight 121.6 kg, SpO2 91 %. CVP:  [16 mmHg-24 mmHg] 17 mmHg  Vent Mode: PCV FiO2 (%):  [30 %-40 %] 40 % Set Rate:  [15 bmp] 15 bmp PEEP:  [8 cmH20-12 cmH20] 8 cmH20 Plateau Pressure:  [19 cmH20-23 cmH20] 19 cmH20   Intake/Output Summary (Last 24 hours) at 02/06/2020 0715 Last data filed at 02/06/2020 0700 Gross per 24 hour  Intake 3761.54 ml  Output 4041 ml  Net -279.46 ml   Filed Weights   02/03/20 0500 02/05/20 0446 02/06/20 0413  Weight: 126.4 kg 122 kg 121.6 kg     Examination:  GEN: critically ill woman lying in bed HEENT: trach site oozing blood CV: RRR, ext warm PULM: minimal breath sounds GI: soft, hypoactive BS EXT: improving edema NEURO: moves ext to command intermittently, eyes open PSYCH: RASS 0 SKIN: no rashes  Plts stable in 60s Sugars labile LDH 1016>>756>>? Hgb stable after 1 unit yesterday ABG pO2 79 a little worse today Vent plat 13, PC 12/12 rate 15, not breathing over Lactic 1.6>>1.3>>1.8>>1.7 Fibrinogen pending INR pending  Resolved Hospital Problem list     Assessment & Plan:     Critically ill due to Acute hypoxic and AoC hypercapneic respiratory failure; likely underly OHS requiring VV ECMO support and mechanical ventilation.  ARDS due to COVID 19 pneumonia   MSSA pneumonia Possibly fungal infection vs contamination. Clinically consistent with infection causing sepsis. Enterococcal bacteremia-recent blood cultures cleared Concern for possible embolic phenomenon- fingers and possibly CVAs were embolic  Elevated LDH, hypo-fibrinogenemia and thrombocytopenia ICH, multifocal. L-sided weakness.  Encephalopathy, improved Acute renal failure, severe uremia.  Anuric.Marland Kitchen Shock related to  RV stunning- milrinone  Septic shock- Enterococcus bacteremias Hyperglycemia > not controlled at all with [place] insulin despite aggressive upward titration.  Assume poor absorption, potentially due to subcutaneous edema.   Remains elevated on IV.  Plan: - Needs new A line - Continue VV support - Zosyn and eraxis with durations TBD - Will try to  pack around trach today, DDAVP x 1, check TEG - Continue to try to pull fluid with CRRT as Bps tolerate - Guarded prognosis  Best practice:  Diet: tube feeding Pain/Anxiety/Delirium protocol (if indicated): as above VAP protocol (if indicated): yes DVT prophylaxis: SCDs , heparin on hold for trach oozing GI prophylaxis: pantoprazole Glucose control:  insulin infusion  Mobility: bed  rest Code Status: limited Family Communication: will update husband when he comes in Disposition: ICU    The patient is critically ill with multiple organ systems failure and requires high complexity decision making for assessment and support, frequent evaluation and titration of therapies, application of advanced monitoring technologies and extensive interpretation of multiple databases. Critical Care Time devoted to patient care services described in this note independent of APP/resident time (if applicable)  is 36 minutes.   Erskine Emery MD [facility] 02/06/2020 7:15 AM Personal pager: [phone] If unanswered, please page CCM On-call: [phone]

## 2020-02-06 NOTE — Progress Notes (Signed)
Patient ID: Alisha Stephenson, female   DOB: [birth year], 54 y.o.   MRN: [id number]    Advanced Heart Failure Rounding Note   Subjective:    - 8/2 COVID + test - 8/9 Cannulated for VV ECMO - 8/13 with several areas of intracranial hemorrhage. Bival stopped.  - 8/14 CT no change in [place]. Increased edema - 8/16 Extubated - 8/16 Head CT stable bleed - 8/22 CVVHD started - 8/24 reintubated - 8/24 circuit changed - 8/30 trach  Remains awake on vent. Will follow commands.  Underwent trach yesterday. Oozing from site. Heparin on hold.   Remains on CVVHD. Weight stable overnight  Hgb 9.5  On Eraxis for fungal coverage, Zosyn for Enterococcal bacteremina.   Currently off pressors.  Milrinone stopped yesterday   CXR with severe bilateral diffuse infiltrates. No chang Personally reviewed   ECMO   Flow 4.6 L RPM 4000 Sweep10  Labs:  7.41/44/79/96% Hgb9.5 PLT 62k LDH 429 -> 451 -> 576 -> 527 -> 547 -> 655 -> 917 -> 1,141 -> 1250 -> 1,288 -> 1130 -> 1016 -> 766 -> not drawn Lactic acid1.7 Fibrinogen 176 -> 316 -> 521 -> 611 -> 722 -> 756 -> not drawn  Objective:   Weight Range:  Vital Signs:   Temp:  [94.5 F (34.7 C)-97.6 F (36.4 C)] 97.5 F (36.4 C) (08/31 0400) Pulse Rate:  [59-89] 73 (08/31 0800) Resp:  [15-43] 43 (08/31 0712) BP: (130-157)/(65-70) 130/67 (08/31 0800) SpO2:  [87 %-100 %] 100 % (08/31 0800) Arterial Line BP: (76-199)/(46-81) 112/58 (08/31 0400) FiO2 (%):  [30 %-40 %] 40 % (08/31 0712) Weight:  [121.6 kg] 121.6 kg (08/31 0413) Last BM Date: 02/05/20  Weight change: Filed Weights   02/03/20 0500 02/05/20 0446 02/06/20 0413  Weight: 126.4 kg 122 kg 121.6 kg    Intake/Output:   Intake/Output Summary (Last 24 hours) at 02/06/2020 0850 Last data filed at 02/06/2020 0800 Gross per 24 hour  Intake 3752.35 ml  Output 4008 ml  Net -255.65 ml     Physical Exam: General:  Ill-appearing + trach HEENT: normal Neck: supple.+ RIJ ECMO + trach   LSC HD cath Carotids 2+ bilat; no bruits. No lymphadenopathy or thryomegaly appreciated. Cor: PMI nondisplaced. Regular rate & rhythm. No rubs, gallops or murmurs. Lungs: minimal air movement Abdomen: obese soft, nontender, nondistended. No hepatosplenomegaly. No bruits or masses. Good bowel sounds. Extremities: no cyanosis, clubbing, rash, 1-2+ edema Neuro: alert & orientedx3, cranial nerves grossly intact. moves all 4 extremities w/o difficulty. Affect pleasant   Telemetry: Sinus 70-80s Personally reviewed   Labs: Basic Metabolic Panel: Recent Labs  Lab 02/02/20 0330 02/02/20 0436 02/03/20 0313 02/03/20 0437 02/04/20 0404 02/04/20 0836 02/04/20 1548 02/04/20 2010 02/05/20 0304 02/05/20 0403 02/05/20 1659 02/05/20 1700 02/05/20 2021 02/06/20 0306 02/06/20 0433  NA 135   < > 137   < > 137   < > 136   < > 137   < > 139 138 139 135 137  K 4.2   < > 3.6   < > 4.4   < > 4.4   < > 4.3   < > 3.9 4.1 4.0 3.7 4.2  CL 100   < > 102   < > 102  --  101  --  102  --   --  103  --  101  --   CO2 24   < > 24   < > 24  --  24  --  23  --   --  25  --  24  --   GLUCOSE 182*   < > 217*   < > 198*  --  171*  --  221*  --   --  169*  --  187*  --   BUN 42*   < > 47*   < > 46*  --  46*  --  44*  --   --  40*  --  39*  --   CREATININE 1.10*   < > 1.15*   < > 1.10*  --  1.01*  --  1.08*  --   --  0.98  --  0.91  --   CALCIUM 7.7*   < > 7.8*   < > 7.9*  --  8.2*  --  8.1*  --   --  8.0*  --  8.0*  --   MG 2.8*  --  2.6*  --  2.8*  --   --   --  2.8*  --   --   --   --  2.5*  --   PHOS 3.7  --  2.6  --  3.2  --   --   --  2.9  --   --   --   --  3.1  --    < > = values in this interval not displayed.    Liver Function Tests: Recent Labs  Lab 02/01/20 0226 02/02/20 0330 02/03/20 0313 02/04/20 0404 02/05/20 0304  AST 245* 195* 162* 105* 73*  ALT 118* 134* 165* 158* 151*  ALKPHOS 204* 259* 252* 243* 264*  BILITOT 1.9* 1.9* 1.6* 1.8* 1.6*  PROT 4.9* 5.4* 5.7* 5.7* 5.7*  ALBUMIN 2.3* 2.4*  2.3* 2.2* 2.1*   No results for input(s): LIPASE, AMYLASE in the last 168 hours. No results for input(s): AMMONIA in the last 168 hours.  CBC: Recent Labs  Lab 02/04/20 1548 02/04/20 2010 02/05/20 0304 02/05/20 0403 02/05/20 1700 02/05/20 2021 02/05/20 2242 02/06/20 0306 02/06/20 0433  WBC 17.6*  --  10.8*  --  8.6  --  8.6 11.3*  --   HGB 8.9*   < > 8.1*   < > 7.9* 8.8* 9.0* 9.5* 10.9*  HCT 28.5*   < > 26.0*   < > 26.2* 26.0* 28.8* 30.3* 32.0*  MCV 104.4*  --  104.4*  --  105.2*  --  103.6* 105.6*  --   PLT 72*  --  60*  --  62*  --  56* 62*  --    < > = values in this interval not displayed.    Cardiac Enzymes: No results for input(s): CKTOTAL, CKMB, CKMBINDEX, TROPONINI in the last 168 hours.  BNP: BNP (last 3 results) No results for input(s): BNP in the last 8760 hours.  ProBNP (last 3 results) No results for input(s): PROBNP in the last 8760 hours.    Other results:  Imaging: DG CHEST PORT 1 VIEW  Result Date: 02/06/2020 CLINICAL DATA:  Respiratory failure.  ECMO.  Recent COVID. EXAM: PORTABLE CHEST 1 VIEW COMPARISON:  02/05/2020. FINDINGS: Tracheostomy tube, feeding tube, left IJ line, right PICC line, ECMO device in stable position. Diffuse opacification of both hemi thoraces again noted with slight aeration in both lungs noted on today's exam. Heart size cannot be evaluated. Prior cervical spine fusion. IMPRESSION: 1.  Lines and tubes including ECMO device in stable position. 2. Diffuse opacification of both hemi thoraces again noted with  slight aeration of both lungs noted on today's exam. Electronically Signed   By: Marcello Moores  Register   On: 02/06/2020 06:53   DG Chest Port 1 View  Result Date: 02/05/2020 CLINICAL DATA:  Shortness of breath. EXAM: PORTABLE CHEST 1 VIEW COMPARISON:  Same day. FINDINGS: Tracheostomy tube is in good position. Feeding tube is seen entering stomach. Right-sided PICC line is unchanged. Left internal jugular catheter is unchanged. ECMO  device is noted. Complete opacification of the lungs is noted bilaterally suggesting pneumonia, atelectasis or effusion. No pneumothorax is noted. Bony thorax is unremarkable. IMPRESSION: Stable support apparatus. Complete opacification of the lungs is noted bilaterally suggesting pneumonia, atelectasis or effusion. Electronically Signed   By: Marijo Conception M.D.   On: 02/05/2020 14:53   DG CHEST PORT 1 VIEW  Result Date: 02/05/2020 CLINICAL DATA:  53 year old female with respiratory failure on ECMO. EXAM: PORTABLE CHEST 1 VIEW COMPARISON:  Chest CT scratch the chest x-ray 02/04/2020. FINDINGS: An endotracheal tube is in place with tip 4.8 cm above the carina. Right upper extremity PICC with tip terminating in the mid to distal superior vena cava. There is a left-sided internal jugular central venous catheter with tip terminating in the mid superior vena cava. A feeding tube is seen extending into the abdomen, however, the tip of the feeding tube extends below the lower margin of the image. Esophageal thermistor noted. ECMO cannula noted. Complete opacification in the lungs bilaterally with multiple air bronchograms. Orthopedic fixation hardware in the lower cervical spine incidentally noted. IMPRESSION: 1. Radiographic appearance the chest is very similar to the prior study, as above. Electronically Signed   By: Vinnie Langton M.D.   On: 02/05/2020 08:15     Medications:     Scheduled Medications: . sodium chloride   Intravenous Once  . sodium chloride   Intravenous Once  . vitamin C  500 mg Per Tube Daily  . B-complex with vitamin C  1 tablet Oral Daily  . chlorhexidine gluconate (MEDLINE KIT)  15 mL Mouth Rinse BID  . Chlorhexidine Gluconate Cloth  6 each Topical Daily  . clonazePAM  1 mg Per Tube Q6H  . docusate  100 mg Per Tube BID  . feeding supplement (PROSource TF)  45 mL Per Tube TID  . fentaNYL (SUBLIMAZE) injection  50 mcg Intravenous Once  . linagliptin  5 mg Per Tube Daily  .  mouth rinse  15 mL Mouth Rinse 10 times per day  . oxyCODONE  10 mg Per Tube Q6H  . pantoprazole (PROTONIX) IV  40 mg Intravenous Daily  . polyethylene glycol  17 g Per Tube Daily  . sennosides  5 mL Per Tube BID  . zinc sulfate  220 mg Per Tube Daily    Infusions: .  prismasol BGK 4/2.5 300 mL/hr at 02/06/20 0448  . sodium chloride 500 mL (02/01/20 1958)  . albumin human 12.5 g (01/28/20 1229)  . albumin human    . amiodarone 30 mg/hr (02/06/20 0800)  . anidulafungin Stopped (02/05/20 1823)  . desmopressin (DDAVP) IV for Bleeding    . dexmedetomidine (PRECEDEX) IV infusion 0.8 mcg/kg/hr (02/06/20 0825)  . feeding supplement (PIVOT 1.5 CAL) 60 mL/hr at 02/06/20 0200  . heparin 500 Units/hr (02/06/20 0200)  . HYDROmorphone 0.5 mg/hr (02/06/20 0800)  . insulin 7 mL/hr at 02/06/20 0800  . milrinone Stopped (02/05/20 0905)  . norepinephrine (LEVOPHED) Adult infusion Stopped (02/06/20 0536)  . piperacillin-tazobactam (ZOSYN)  IV Stopped (02/06/20 0539)  . prismasol  BGK 2/2.5 replacement solution 500 mL/hr at 02/06/20 0010  . prismasol BGK 4/2.5 2,000 mL/hr at 02/06/20 0457  . vasopressin Stopped (02/05/20 0407)    PRN Medications: sodium chloride, acetaminophen (TYLENOL) oral liquid 160 mg/5 mL, albumin human, albuterol, dextrose, fentaNYL, fentaNYL (SUBLIMAZE) injection, guaiFENesin-dextromethorphan, heparin, hydrALAZINE, HYDROmorphone, hydrOXYzine, ondansetron **OR** ondansetron (ZOFRAN) IV, sodium chloride   Assessment/Plan:   1. Acute hypoxic/hypercapneic respiratory failure in setting of severe COVID PNA/ARDS -> VV ECMO - admit 8/2 - intubation 8/3 - has received actmera (compelted 8/2), remdesivir (completed 8/6) and steroids - failed full vent support with proning/paralytic -> Cannulated for VV ECMO on 8/9 - Extubated 8/16. Reintubated 8/24 - CXR unchanged -> diffuse infiltrates no change Personally reviewed - s/p trach 8/30. Oozing from trach. Wil pack and hold heparin  for now  - Circuit changed 8/24 due to concern for hemolysis and worsening oxygenation. Oxygenation improved. LDH trending down and PLTs and fibrinogen trending up.  - CVVHD started 8/22 for volume removal and uremia. Remains anuric. Volume status much improved.Weight down 25 pounds. Will continue. Keep mildly negative  - Still with multi-system organ failure (brain, lungs, kidneys, liver, vascular injury).  Prognosis remains concerning but has held steady this past week. Hopeful for continued progress pending recovery of her lung function. Now s/p trach. I spoke with her husband at the bedside yesterday. Continue current support. Needs new a-line today for ongoing monitoring and ABGs  2. Enterococcus sepsis - back on high-dose zosyn per ID - Eraxis added on 8/26 with yeast in BAL 8/24 - Now off pressors. - milrinone stopped 8/30  3. Intracranial hemorrhage - ? Septic emboli - repeat head CT on 8/14 with stable bleeds but increased edema - neurology has seen. Suspect significant long-term injury sustained. Will follow commands. Appears to have dense LUE weakness and possibly LLE - repeat head CT stable 8/16 - Now back on low-dose heparin to protect circuit from further clotting. Was toerlating heparin at 500u/hr. But will hold today until trach oozing slows/stops.  Discussed dosing with PharmD personally. - Consider TEE at some point to further evalaute. Can consider now that she is re-intubated but won't change management currently  4. Thrombocytopenia - PLTs stable at 60k to after circuit change - Continue to follow  5. Morbid obesity - Body mass index is Body mass index is 46.02 kg/m.  6. Poorly controlled DM2 - HgBA1c 10.7 - CBGs remain elevated - On IV insulin adjust as needed  6. Hypernatremia - Resolved with HD.   7. Lactic acidosis -  Normal at 1.7 today.   8. PAF - intermittent episodes. Last 8/26 - In NSR on IV amio this am. Billey Gosling continue   9. AKI/azotemia -  CVVHD started on 8/22 - Net negative yesterday, continue gentle UF.  - D/w Renal  10. Ischemic R fingers - ? Due to septic embolic versus vascular issue - VVS following. No change  11. Acute liver failure - has underlying fatty liver - mix of conjugated/unconjugated - LFTs trending back down   CRITICAL CARE Performed by: Glori Bickers  Total critical care time: 35 minutes  Critical care time was exclusive of separately billable procedures and treating other patients.  Critical care was necessary to treat or prevent imminent or life-threatening deterioration.  Critical care was time spent personally by me (independent of midlevel providers or residents) on the following activities: development of treatment plan with patient and/or surrogate as well as nursing, discussions with consultants, evaluation of patient's response to  treatment, examination of patient, obtaining history from patient or surrogate, ordering and performing treatments and interventions, ordering and review of laboratory studies, ordering and review of radiographic studies, pulse oximetry and re-evaluation of patient's condition.   Length of Stay: 29   Glori Bickers  MD 02/06/2020, 8:50 AM  Advanced Heart Failure Team Pager [phone] (M-F; 7a - 4p)  Please contact [facility] for night-coverage after hours (4p -7a ) and weekends on [email or web address]

## 2020-02-06 NOTE — Progress Notes (Signed)
Nutrition Follow Up  DOCUMENTATION CODES:   Morbid obesity  INTERVENTION:   Plan long term feeding tube as patient is day 29 with NG  Continue tube feeding:  -Pivot 1.5 @ 75 ml/hr (1800 ml) via Cortrak -45 ml Prosource TID  Provides: 2820 kcals, 202 grams protein, 1350 ml free water. Meets 100% needs.   Continue B complex with Vitamin C to account for losses with CRRT   NUTRITION DIAGNOSIS:   Increased nutrient needs related to acute illness (COVID-19 infection) as evidenced by estimated needs.  Ongoing  GOAL:   Patient will meet greater than or equal to 90% of their needs  Addressed via TF  MONITOR:   Vent status, TF tolerance, Labs, Weight trends  REASON FOR ASSESSMENT:   Ventilator, Consult Enteral/tube feeding initiation and management  ASSESSMENT:   54 y.o. female with medical history of type 2 DM. She presented to the ED at [facility] with SOB diarrhea x1 week, and cough. She was dx with COVID-19 one week prior to presentation to the ED.   8/9- tx from [facility], s/p VV ECMO cannulation 8/13- CT head with multiple areas of [place] 8/16- extubated, gastric Cortrak placed  8/22- start CRRT 8/30- trach  Pt discussed during ICU rounds and with RN.   Off pressors. Kept even on CRRT. Remains anuric. Bleeding via trach, requiring packing. Pivot 1.5 turned down to 60 ml/hr over weekend? Increase back to goal rate. Good stool output. CBGs improved on insulin drip.   Admission weight: 133.6 kg Current weight: 121.6 kg (down 9 kg over the last 3 days)  Patient remains on ventilator support via trach MV: 1.8 L/min Temp (24hrs), Avg:97.4 F (36.3 C), Min:97.1 F (36.2 C), Max:97.6 F (36.4 C)   I/O: -1,757 ml since 8/17 UOP: anuric  Stool: 250 ml x 24 hrs  CRRT: 3,791 ml x 24 hrs   Drips: albumin, precedex, insulin Medications: 500 mg vitamin C, b complex with vitamin C, colace, tradjenta, miralax, senokot, zinc sulfate  Labs: Mg 2.5 (H) CBG 134-220  LFTs trending down  Diet Order:   Diet Order            Diet NPO time specified  Diet effective midnight                 EDUCATION NEEDS:   No education needs have been identified at this time  Skin:  Skin Assessment: Skin Integrity Issues: Skin Integrity Issues:: Other (Comment) Other: MASD- breast skin tears-face/chest/chin/buttocks  Last BM:  8/31 via rectal tube  Height:   Ht Readings from Last 1 Encounters:  02/03/2020 5\' 4"  (1.626 m)    Weight:   Wt Readings from Last 1 Encounters:  02/06/20 121.6 kg    Ideal Body Weight:  54.5 kg Adj Body Weight: 84.7 kg   BMI:  Body mass index is 46.02 kg/m.  Estimated Nutritional Needs:   Kcal:  2130-8657 kcal  Protein:  170-210 grams  Fluid:  >/= 2 L/day  Mariana Single RD, LDN Clinical Nutrition Pager listed in [place]

## 2020-02-06 NOTE — Progress Notes (Signed)
[facility] ROUNDING NOTE   Subjective:   This is a 54 year old lady with history of diabetes obesity presented with acute hypoxic respiratory failure due to COVID-19 infection.  Hospitalization has been complicated by severe shock refractory hypoxia requiring ECMO and multiple infections, intracranial hemorrhage and encephalopathy.  Baseline creatinine appears to be about 0.7 mg/dL.  She developed acute shock secondary to acute tubular necrosis and started on CRRT 01/28/2020.  Status post percutaneous tracheostomy with bronchoscopic guidance 02/05/2020  Blood pressure 143/67 pulse 81 temperature 97.5 O2 sats 96% FiO2 40%  IV pressors: Milrinone, vasopressin, norepinephrine  IV amiodarone IV heparin  Sodium 137 potassium 4.2 chloride 101 CO2 24 BUN 39 creatinine 0.91 glucose 187 phosphorus 3.1 calcium 8 magnesium 2.5 hemoglobin 10.9 WBC 11.3 platelets 62   Protonix 40 mg daily, oxycodone 10 mg every 6 hours, Tradjenta 5 mg daily  Eraxis 200 mg daily, IV Zosyn 4.5 g every 6 hours  Objective:  Vital signs in last 24 hours:  Temp:  [94.5 F (34.7 C)-97.6 F (36.4 C)] 97.5 F (36.4 C) (08/31 0400) Pulse Rate:  [59-85] 80 (08/31 0606) Resp:  [15-19] 19 (08/31 0400) BP: (140-143)/(65-67) 143/67 (08/31 0606) SpO2:  [92 %-100 %] 96 % (08/31 0606) Arterial Line BP: (76-199)/(46-81) 112/58 (08/31 0400) FiO2 (%):  [30 %-40 %] 40 % (08/31 0400) Weight:  [121.6 kg] 121.6 kg (08/31 0413)  Weight change: -0.4 kg Filed Weights   02/03/20 0500 02/05/20 0446 02/06/20 0413  Weight: 126.4 kg 122 kg 121.6 kg    Intake/Output: I/O last 3 completed shifts: In: 4748.3 [I.V.:2381.3; Blood:350; NG/GT:1080; IV Piggyback:937.1] Out: 1660 [YTKZS:0109; Stool:100]   Intake/Output this shift:  Total I/O In: 1956.4 [I.V.:566; Blood:387.5; NG/GT:780; IV Piggyback:222.9] Out: 1952 [Other:1702; Stool:250]  General: Critically ill female, has ECMO running.  Not much  improvement. Heart:RRR, s1s2 nl Lungs: Coarse breath sound bilateral Abdomen:soft, non-distended Extremities: Anasarca and edema improving. Dialysis Access: Temporary HD catheter   Basic Metabolic Panel: Recent Labs  Lab 02/02/20 0330 02/02/20 0436 02/03/20 0313 02/03/20 0437 02/04/20 0404 02/04/20 0836 02/04/20 1548 02/04/20 2010 02/05/20 0304 02/05/20 0403 02/05/20 1659 02/05/20 1700 02/05/20 2021 02/06/20 0306 02/06/20 0433  NA 135   < > 137   < > 137   < > 136   < > 137   < > 139 138 139 135 137  K 4.2   < > 3.6   < > 4.4   < > 4.4   < > 4.3   < > 3.9 4.1 4.0 3.7 4.2  CL 100   < > 102   < > 102  --  101  --  102  --   --  103  --  101  --   CO2 24   < > 24   < > 24  --  24  --  23  --   --  25  --  24  --   GLUCOSE 182*   < > 217*   < > 198*  --  171*  --  221*  --   --  169*  --  187*  --   BUN 42*   < > 47*   < > 46*  --  46*  --  44*  --   --  40*  --  39*  --   CREATININE 1.10*   < > 1.15*   < > 1.10*  --  1.01*  --  1.08*  --   --  0.98  --  0.91  --   CALCIUM 7.7*   < > 7.8*   < > 7.9*  --  8.2*  --  8.1*  --   --  8.0*  --  8.0*  --   MG 2.8*  --  2.6*  --  2.8*  --   --   --  2.8*  --   --   --   --  2.5*  --   PHOS 3.7  --  2.6  --  3.2  --   --   --  2.9  --   --   --   --  3.1  --    < > = values in this interval not displayed.    Liver Function Tests: Recent Labs  Lab 02/01/20 0226 02/02/20 0330 02/03/20 0313 02/04/20 0404 02/05/20 0304  AST 245* 195* 162* 105* 73*  ALT 118* 134* 165* 158* 151*  ALKPHOS 204* 259* 252* 243* 264*  BILITOT 1.9* 1.9* 1.6* 1.8* 1.6*  PROT 4.9* 5.4* 5.7* 5.7* 5.7*  ALBUMIN 2.3* 2.4* 2.3* 2.2* 2.1*   No results for input(s): LIPASE, AMYLASE in the last 168 hours. No results for input(s): AMMONIA in the last 168 hours.  CBC: Recent Labs  Lab 02/04/20 1548 02/04/20 2010 02/05/20 0304 02/05/20 0403 02/05/20 1700 02/05/20 2021 02/05/20 2242 02/06/20 0306 02/06/20 0433  WBC 17.6*  --  10.8*  --  8.6  --  8.6  11.3*  --   HGB 8.9*   < > 8.1*   < > 7.9* 8.8* 9.0* 9.5* 10.9*  HCT 28.5*   < > 26.0*   < > 26.2* 26.0* 28.8* 30.3* 32.0*  MCV 104.4*  --  104.4*  --  105.2*  --  103.6* 105.6*  --   PLT 72*  --  60*  --  62*  --  56* 62*  --    < > = values in this interval not displayed.    Cardiac Enzymes: No results for input(s): CKTOTAL, CKMB, CKMBINDEX, TROPONINI in the last 168 hours.  BNP: Invalid input(s): POCBNP  CBG: Recent Labs  Lab 02/05/20 2019 02/05/20 2251 02/06/20 0314 02/06/20 0421 02/06/20 0612  GLUCAP 154* 151* 181* 160* 182*    Microbiology: Results for orders placed or performed during the hospital encounter of 01/16/2020  Blood Culture (routine x 2)     Status: None   Collection Time: 01/18/2020 10:20 AM   Specimen: BLOOD  Result Value Ref Range Status   Specimen Description   Final    BLOOD RIGHT ANTECUBITAL Performed at [facility] [place], [address] [place], [place] [address]    Special Requests   Final    BOTTLES DRAWN AEROBIC AND ANAEROBIC Blood Culture adequate volume Performed at [facility] [place], [address] [place], [place] [address]    Culture   Final    NO GROWTH 5 DAYS Performed at  [facility], [address] [place], [place] [address]    Report Status 01/13/2020 FINAL  Final  Blood Culture (routine x 2)     Status: None   Collection Time: 01/27/2020 11:19 AM   Specimen: BLOOD  Result Value Ref Range Status   Specimen Description   Final    BLOOD BLOOD RIGHT FOREARM Performed at [facility] [place], [address] [place],  [address]    Special Requests   Final    BOTTLES DRAWN AEROBIC ONLY   Blood Culture results may not be optimal due to an inadequate volume of blood received in culture bottles Performed at [facility] [place], [address] [place], [place] [address]    Culture   Final    NO GROWTH 5 DAYS Performed at [facility], [address] [place], [place] [address]    Report Status  01/13/2020 FINAL  Final  SARS Coronavirus 2 by RT PCR (hospital order, performed in [facility] hospital lab) Nasopharyngeal Nasopharyngeal Swab     Status: Abnormal   Collection Time: 01/25/2020 11:19 AM   Specimen: Nasopharyngeal Swab  Result Value Ref Range Status   SARS Coronavirus 2 POSITIVE (A) NEGATIVE Final    Comment: RESULT CALLED TO, READ BACK BY AND VERIFIED WITH:  SIMMS MARVA, RN @ 1243 ON 01/16/2020, [email or web address].P (NOTE) SARS-CoV-2 target nucleic acids are DETECTED  SARS-CoV-2 RNA is generally detectable in upper respiratory specimens  during the acute phase of infection.  Positive results are indicative  of the presence of the identified virus, but do not rule out bacterial infection or co-infection with other pathogens not detected by the test.  Clinical correlation with patient history and  other diagnostic information is necessary to determine patient infection status.  The expected result is negative.  Fact Sheet for Patients:   https://www.fda.gov/media/136312/download   Fact Sheet for Healthcare Providers:   https://www.fda.gov/media/136313/download    This test is not yet approved or cleared by the [place] FDA and  has been authorized for detection and/or diagnosis of SARS-CoV-2 by FDA under an Emergency Use Authorization (EUA).  This EUA will remain in effect  (meaning this test can be used) for the duration of  the COVID-19 declaration under Section 564(b)(1) of the Act, 21 U.S.C. section 360-bbb-3(b)(1), unless the authorization is terminated or revoked sooner.  Performed at [facility] [place], [address] [place], [place] [address]   Culture, respiratory (non-expectorated)     Status: None   Collection Time: 01/10/20  8:32 AM   Specimen: Tracheal Aspirate; Respiratory  Result Value Ref Range Status   Specimen Description   Final    TRACHEAL ASPIRATE Performed at [facility], [address] [place], [place] [address]     Special Requests   Final    NONE Performed at [facility], [address] [place], [place] [address]    Gram Stain NO WBC SEEN NO ORGANISMS SEEN   Final   Culture   Final    FEW Consistent with normal respiratory flora. No Pseudomonas species isolated Performed at [facility], [address] [place], [place] [address]    Report Status 01/12/2020 FINAL  Final  MRSA PCR Screening     Status: None   Collection Time: 01/10/20  6:11 PM   Specimen: Nasal Mucosa; Nasopharyngeal  Result Value Ref Range Status   MRSA by PCR NEGATIVE NEGATIVE Final    Comment:        The GeneXpert MRSA Assay (FDA approved for NASAL specimens only), is one component of a comprehensive MRSA colonization surveillance program. It is not intended to diagnose MRSA infection nor to guide or monitor treatment for MRSA infections. Performed at [facility], [address] [place], [place] [address]   Culture, Urine     Status: None   Collection Time: 01/10/20  6:11 PM   Specimen: Urine, Random  Result Value Ref Range Status   Specimen Description   Final      URINE, RANDOM Performed at [facility], [place] [address], [place], [place] [address]    Special Requests   Final    NONE Performed at [facility], [place] [address], [place], [place] [address]    Culture   Final    NO GROWTH Performed at [facility], [place] [address], [place], [place] [address]    Report Status 01/11/2020 FINAL  Final  Culture, respiratory (non-expectorated)     Status: None   Collection Time: 01/18/20  8:44 AM   Specimen: Tracheal Aspirate; Respiratory  Result Value Ref Range Status   Specimen Description TRACHEAL ASPIRATE  Final   Special Requests NONE  Final   Gram Stain   Final    NO WBC SEEN FEW GRAM POSITIVE COCCI IN PAIRS IN CLUSTERS Performed at [facility], [address] [place], [place] [address]    Culture ABUNDANT  STAPHYLOCOCCUS AUREUS  Final   Report Status 01/20/2020 FINAL  Final   Organism ID, Bacteria STAPHYLOCOCCUS AUREUS  Final      Susceptibility   Staphylococcus aureus - MIC*    CIPROFLOXACIN <=0.5 SENSITIVE Sensitive     ERYTHROMYCIN >=8 RESISTANT Resistant     GENTAMICIN <=0.5 SENSITIVE Sensitive     OXACILLIN <=0.25 SENSITIVE Sensitive     TETRACYCLINE <=1 SENSITIVE Sensitive     VANCOMYCIN <=0.5 SENSITIVE Sensitive     TRIMETH/SULFA <=10 SENSITIVE Sensitive     CLINDAMYCIN RESISTANT Resistant     RIFAMPIN <=0.5 SENSITIVE Sensitive     Inducible Clindamycin POSITIVE Resistant     * ABUNDANT STAPHYLOCOCCUS AUREUS  Culture, blood (Routine X 2) w Reflex to ID Panel     Status: Abnormal   Collection Time: 01/18/20 10:17 AM   Specimen: BLOOD  Result Value Ref Range Status   Specimen Description BLOOD LEFT ANTECUBITAL  Final   Special Requests   Final    BOTTLES DRAWN AEROBIC ONLY Blood Culture adequate volume   Culture  Setup Time   Final    AEROBIC BOTTLE ONLY GRAM POSITIVE COCCI Organism ID to follow CRITICAL RESULT CALLED TO, READ BACK BY AND VERIFIED WITH: Denton Brick [facility] 01/19/20 0044 JDW Performed at [facility], [address] [place], [place] [address]    Culture ENTEROCOCCUS FAECALIS (A)  Final   Report Status 01/20/2020 FINAL  Final   Organism ID, Bacteria ENTEROCOCCUS FAECALIS  Final      Susceptibility   Enterococcus faecalis - MIC*    AMPICILLIN <=2 SENSITIVE Sensitive     VANCOMYCIN 1 SENSITIVE Sensitive     GENTAMICIN SYNERGY SENSITIVE Sensitive     * ENTEROCOCCUS FAECALIS  Blood Culture ID Panel (Reflexed)     Status: Abnormal   Collection Time: 01/18/20 10:17 AM  Result Value Ref Range Status   Enterococcus faecalis DETECTED (A) NOT DETECTED Final    Comment: CRITICAL RESULT CALLED TO, READ BACK BY AND VERIFIED WITH: G ABBOTT PHARMD 01/19/20 0044 JDW    Enterococcus Faecium NOT DETECTED NOT DETECTED Final   Listeria monocytogenes NOT DETECTED NOT  DETECTED Final   Staphylococcus species NOT DETECTED NOT DETECTED Final   Staphylococcus aureus (BCID) NOT DETECTED NOT DETECTED Final   Staphylococcus epidermidis NOT DETECTED NOT DETECTED Final   Staphylococcus lugdunensis NOT DETECTED NOT DETECTED Final   Streptococcus species NOT DETECTED NOT DETECTED Final   Streptococcus agalactiae NOT DETECTED NOT DETECTED Final   Streptococcus pneumoniae NOT DETECTED NOT DETECTED Final   Streptococcus pyogenes  NOT DETECTED NOT DETECTED Final   A.calcoaceticus-baumannii NOT DETECTED NOT DETECTED Final   Bacteroides fragilis NOT DETECTED NOT DETECTED Final   Enterobacterales NOT DETECTED NOT DETECTED Final   Enterobacter cloacae complex NOT DETECTED NOT DETECTED Final   Escherichia coli NOT DETECTED NOT DETECTED Final   Klebsiella aerogenes NOT DETECTED NOT DETECTED Final   Klebsiella oxytoca NOT DETECTED NOT DETECTED Final   Klebsiella pneumoniae NOT DETECTED NOT DETECTED Final   Proteus species NOT DETECTED NOT DETECTED Final   Salmonella species NOT DETECTED NOT DETECTED Final   Serratia marcescens NOT DETECTED NOT DETECTED Final   Haemophilus influenzae NOT DETECTED NOT DETECTED Final   Neisseria meningitidis NOT DETECTED NOT DETECTED Final   Pseudomonas aeruginosa NOT DETECTED NOT DETECTED Final   Stenotrophomonas maltophilia NOT DETECTED NOT DETECTED Final   Candida albicans NOT DETECTED NOT DETECTED Final   Candida auris NOT DETECTED NOT DETECTED Final   Candida glabrata NOT DETECTED NOT DETECTED Final   Candida krusei NOT DETECTED NOT DETECTED Final   Candida parapsilosis NOT DETECTED NOT DETECTED Final   Candida tropicalis NOT DETECTED NOT DETECTED Final   Cryptococcus neoformans/gattii NOT DETECTED NOT DETECTED Final   Vancomycin resistance NOT DETECTED NOT DETECTED Final    Comment: Performed at [facility], [address] [place], [place] [address]  Culture, blood (Routine X 2) w Reflex to ID Panel     Status: Abnormal    Collection Time: 01/18/20 10:30 AM   Specimen: BLOOD LEFT ARM  Result Value Ref Range Status   Specimen Description BLOOD LEFT ARM  Final   Special Requests   Final    BOTTLES DRAWN AEROBIC ONLY Blood Culture adequate volume   Culture  Setup Time   Final    AEROBIC BOTTLE ONLY GRAM POSITIVE COCCI CRITICAL VALUE NOTED.  VALUE IS CONSISTENT WITH PREVIOUSLY REPORTED AND CALLED VALUE.    Culture (A)  Final    ENTEROCOCCUS FAECALIS SUSCEPTIBILITIES PERFORMED ON PREVIOUS CULTURE WITHIN THE LAST 5 DAYS. Performed at [facility], [address] [place], [place] [address]    Report Status 01/20/2020 FINAL  Final  Culture, blood (routine x 2)     Status: None   Collection Time: 01/21/20  6:07 PM   Specimen: BLOOD LEFT HAND  Result Value Ref Range Status   Specimen Description BLOOD LEFT HAND  Final   Special Requests   Final    BOTTLES DRAWN AEROBIC AND ANAEROBIC Blood Culture adequate volume   Culture   Final    NO GROWTH 5 DAYS Performed at  [facility], [address] [place], [place] [address]    Report Status 01/26/2020 FINAL  Final  Culture, blood (routine x 2)     Status: None   Collection Time: 01/21/20  6:14 PM   Specimen: BLOOD LEFT FOREARM  Result Value Ref Range Status   Specimen Description BLOOD LEFT FOREARM  Final   Special Requests   Final    BOTTLES DRAWN AEROBIC ONLY Blood Culture adequate volume   Culture   Final    NO GROWTH 5 DAYS Performed at  [facility], [address] [place],  [address]    Report Status 01/26/2020 FINAL  Final  Culture, blood (Routine X 2) w Reflex to ID Panel     Status: None   Collection Time: 01/25/20  1:15 PM   Specimen: BLOOD  Result Value Ref Range Status   Specimen Description BLOOD LEFT ANTECUBITAL  Final     Special Requests   Final    BOTTLES DRAWN AEROBIC AND ANAEROBIC Blood Culture adequate volume   Culture   Final    NO GROWTH 5 DAYS Performed at [facility], [address]  [place], [place] [address]    Report Status 01/30/2020 FINAL  Final  Culture, blood (Routine X 2) w Reflex to ID Panel     Status: None   Collection Time: 01/25/20  1:23 PM   Specimen: BLOOD LEFT FOREARM  Result Value Ref Range Status   Specimen Description BLOOD LEFT FOREARM  Final   Special Requests   Final    BOTTLES DRAWN AEROBIC AND ANAEROBIC Blood Culture adequate volume   Culture   Final    NO GROWTH 5 DAYS Performed at [facility], [address] [place], [place] [address]    Report Status 01/30/2020 FINAL  Final  SARS Coronavirus 2 by RT PCR (hospital order, performed in  hospital lab) Nasopharyngeal     Status: None   Collection Time: 01/29/20  9:00 AM   Specimen: Nasopharyngeal  Result Value Ref Range Status   SARS Coronavirus 2 NEGATIVE NEGATIVE Final    Comment: (NOTE) SARS-CoV-2 target nucleic acids are NOT DETECTED.  The SARS-CoV-2 RNA is generally detectable in upper and lower respiratory specimens during the acute phase of infection. The lowest concentration of SARS-CoV-2 viral copies this assay can detect is 250 copies / mL. A negative result does not preclude SARS-CoV-2 infection and should not be used as the sole basis for treatment or other patient management decisions.  A negative result may occur with improper specimen collection / handling, submission of specimen other than nasopharyngeal swab, presence of viral mutation(s) within the areas targeted by this assay, and inadequate number of viral copies (<250 copies / mL). A negative result must be combined with clinical observations, patient history, and epidemiological information.  Fact Sheet for Patients:   https://www.fda.gov/media/136312/download  Fact Sheet for Healthcare Providers: https://www.fda.gov/media/136313/download  This test is not yet approved or  cleared by the [place] FDA and has been authorized for detection and/or diagnosis of SARS-CoV-2 by FDA under an  Emergency Use Authorization (EUA).  This EUA will remain in effect (meaning this test can be used) for the duration of the COVID-19 declaration under Section 564(b)(1) of the Act, 21 U.S.C. section 360bbb-3(b)(1), unless the authorization is terminated or revoked sooner.  Performed at [facility], [address] [place], [place] [address]   Culture, bal-quantitative     Status: Abnormal   Collection Time: 01/30/20 10:36 AM   Specimen: Bronchial Alveolar Lavage; Respiratory  Result Value Ref Range Status   Specimen Description Bronch Lavag  Final   Special Requests NONE  Final   Gram Stain   Final    ABUNDANT WBC PRESENT,BOTH PMN AND MONONUCLEAR NO ORGANISMS SEEN Performed at [facility], [address] [place],  [address]    Culture 10,000 COLONIES/mL CANDIDA DUBLINIENSIS (A)  Final   Report Status 02/02/2020 FINAL  Final  Culture, blood (routine x 2)     Status: None   Collection Time: 01/31/20  5:36 PM   Specimen: BLOOD RIGHT HAND  Result Value Ref Range Status   Specimen Description BLOOD RIGHT HAND  Final   Special Requests   Final    BOTTLES DRAWN AEROBIC ONLY Blood Culture results may not be optimal due to an inadequate volume of blood received in culture bottles   Culture     Final    NO GROWTH 5 DAYS Performed at [facility], [address] [place], [place] [address]    Report Status 02/05/2020 FINAL  Final  Culture, blood (routine x 2)     Status: None   Collection Time: 01/31/20  5:50 PM   Specimen: BLOOD RIGHT HAND  Result Value Ref Range Status   Specimen Description BLOOD RIGHT HAND  Final   Special Requests   Final    BOTTLES DRAWN AEROBIC ONLY Blood Culture results may not be optimal due to an inadequate volume of blood received in culture bottles   Culture   Final    NO GROWTH 5 DAYS Performed at  [facility], [address] [place], [place] [address]    Report Status 02/05/2020 FINAL  Final    Coagulation  Studies: Recent Labs    02/04/20 0404 02/05/20 0304 02/05/20 2242  LABPROT 14.0 13.8 13.8  INR 1.1 1.1 1.1    Urinalysis: No results for input(s): COLORURINE, LABSPEC, PHURINE, GLUCOSEU, HGBUR, BILIRUBINUR, KETONESUR, PROTEINUR, UROBILINOGEN, NITRITE, LEUKOCYTESUR in the last 72 hours.  Invalid input(s): APPERANCEUR    Imaging: DG Chest Port 1 View  Result Date: 02/05/2020 CLINICAL DATA:  Shortness of breath. EXAM: PORTABLE CHEST 1 VIEW COMPARISON:  Same day. FINDINGS: Tracheostomy tube is in good position. Feeding tube is seen entering stomach. Right-sided PICC line is unchanged. Left internal jugular catheter is unchanged. ECMO device is noted. Complete opacification of the lungs is noted bilaterally suggesting pneumonia, atelectasis or effusion. No pneumothorax is noted. Bony thorax is unremarkable. IMPRESSION: Stable support apparatus. Complete opacification of the lungs is noted bilaterally suggesting pneumonia, atelectasis or effusion. Electronically Signed   By: James  Green Jr M.D.   On: 02/05/2020 14:53   DG CHEST PORT 1 VIEW  Result Date: 02/05/2020 CLINICAL DATA:  54-year-old female with respiratory failure on ECMO. EXAM: PORTABLE CHEST 1 VIEW COMPARISON:  Chest CT scratch the chest x-ray 02/04/2020. FINDINGS: An endotracheal tube is in place with tip 4.8 cm above the carina. Right upper extremity PICC with tip terminating in the mid to distal superior vena cava. There is a left-sided internal jugular central venous catheter with tip terminating in the mid superior vena cava. A feeding tube is seen extending into the abdomen, however, the tip of the feeding tube extends below the lower margin of the image. Esophageal thermistor noted. ECMO cannula noted. Complete opacification in the lungs bilaterally with multiple air bronchograms. Orthopedic fixation hardware in the lower cervical spine incidentally noted. IMPRESSION: 1. Radiographic appearance the chest is very similar to the  prior study, as above. Electronically Signed   By: Daniel  Entrikin M.D.   On: 02/05/2020 08:15   DG CHEST PORT 1 VIEW  Result Date: 02/04/2020 CLINICAL DATA:  ECMO. EXAM: PORTABLE CHEST 1 VIEW COMPARISON:  February 03, 2020 FINDINGS: The ETT is in good position. The feeding tube terminates below today's film. A left central line is stable. A right PICC line is again seen although the distal tip is difficult to visualize. An ECMO device remains. Near complete opacification of the lungs remains. No other changes. A left subclavian central line is stable. IMPRESSION: 1. Support apparatus as above. 2. Near complete opacification of the lungs is stable. Electronically Signed   By: David  Williams III M.D   On: 02/04/2020 10:51     Medications:   .  prismasol BGK 4/2.5 300 mL/hr at 02/06/20 0448  . sodium chloride 500 mL (02/01/20 1958)  .   albumin human 12.5 g (01/28/20 1229)  . albumin human    . amiodarone 30 mg/hr (02/06/20 0600)  . anidulafungin Stopped (02/05/20 1823)  . dexmedetomidine (PRECEDEX) IV infusion 0.5 mcg/kg/hr (02/06/20 0600)  . feeding supplement (PIVOT 1.5 CAL) 60 mL/hr at 02/06/20 0200  . heparin 500 Units/hr (02/06/20 0200)  . HYDROmorphone 0.5 mg/hr (02/06/20 0600)  . insulin 4.8 Units/hr (02/06/20 0614)  . milrinone Stopped (02/05/20 0905)  . norepinephrine (LEVOPHED) Adult infusion 1 mcg/min (02/05/20 1900)  . piperacillin-tazobactam (ZOSYN)  IV Stopped (02/06/20 0539)  . prismasol BGK 2/2.5 replacement solution 500 mL/hr at 02/06/20 0010  . prismasol BGK 4/2.5 2,000 mL/hr at 02/06/20 0457  . vasopressin Stopped (02/05/20 0407)   . sodium chloride   Intravenous Once  . sodium chloride   Intravenous Once  . vitamin C  500 mg Per Tube Daily  . B-complex with vitamin C  1 tablet Oral Daily  . chlorhexidine gluconate (MEDLINE KIT)  15 mL Mouth Rinse BID  . Chlorhexidine Gluconate Cloth  6 each Topical Daily  . clonazePAM  1 mg Per Tube Q6H  . docusate  100 mg Per Tube  BID  . feeding supplement (PROSource TF)  45 mL Per Tube TID  . fentaNYL (SUBLIMAZE) injection  50 mcg Intravenous Once  . linagliptin  5 mg Per Tube Daily  . mouth rinse  15 mL Mouth Rinse 10 times per day  . oxyCODONE  10 mg Per Tube Q6H  . pantoprazole (PROTONIX) IV  40 mg Intravenous Daily  . polyethylene glycol  17 g Per Tube Daily  . sennosides  5 mL Per Tube BID  . zinc sulfate  220 mg Per Tube Daily   sodium chloride, acetaminophen (TYLENOL) oral liquid 160 mg/5 mL, albumin human, albuterol, dextrose, fentaNYL, fentaNYL (SUBLIMAZE) injection, guaiFENesin-dextromethorphan, heparin, hydrALAZINE, HYDROmorphone, hydrOXYzine, ondansetron **OR** ondansetron (ZOFRAN) IV, sodium chloride  Assessment/ Plan:  1.  Anuric AKI due to shock, sepsis causing acute tubular necrosis: Started CRRT on 8/22 for uremia and volume overload. She continues on ECMO. Continue CRRT, prognosis grim.  2.Shock due to sepsis, Covid infection, Enterococcus bacteremia: On pressors, antibiotics per primary team.  3.Acute hypoxic and hypercapnic respiratory failure, ARDS due to COVID-19 pneumonia: Currently on ECMO for refractory hypoxemia, MSSA pneumonia.  On broad-spectrum antibiotics.  IV Zosyn and IV Eraxis appreciate assistance from infectious disease.  Status post tracheostomy 04/06/2020  3.Hypernatremia, hypervolemia: relative free water deficit.  Improved with CRRT.  4.Acute toxic metabolic encephalopathy:Multifactorial with uremia contributing. Treatment with CRRT as above.  5.Anemia, thrombocytopenia: With hemolysis likely and acute illness contributing. Transfusions per primary team.  Seen by hematologist.  6.Intracranial hemorrhage: With some left-sided weakness preceded by primary team.   7. GOC: Multiorgan failure.   family meeting on 8/25, continue full scope of treatment.  Long-term prognosis poor.  8.  Diabetes as per primary service continues on oral Tradjenta.    LOS: 29  W   @TODAY@6:47 AM  

## 2020-02-06 NOTE — Plan of Care (Signed)
PT is alert, able to nod appropriately to yes/no questions. Pt has been calmer than the night before and able to mouth out words. Pt is able to move all extremities and continues to be very weak, especially on the left side. No issues with the CRRT machine tonight. Pt has remained on warming blanket throughout the night. ALINE was very positional and did not flush/pull back this morning, it was pulled out. ECMO specialist notified MD about the ALINE . BP cuff is now being used on the left leg. Pt has had oozing from the trach site, thrombi pad was ordered and placed, dressing has been changed 3 times. Hemoglobin remains stable per morning labs. No other signs of bleeding noted at this time.  Problem: Education: Goal: Knowledge of General Education information will improve Description: Including pain rating scale, medication(s)/side effects and non-pharmacologic comfort measures Outcome: Progressing   Problem: Clinical Measurements: Goal: Diagnostic test results will improve Outcome: Progressing Goal: Respiratory complications will improve Outcome: Progressing Goal: Cardiovascular complication will be avoided Outcome: Progressing   Problem: Activity: Goal: Risk for activity intolerance will decrease Outcome: Progressing   Problem: Coping: Goal: Level of anxiety will decrease Outcome: Progressing

## 2020-02-06 NOTE — Progress Notes (Signed)
ANTICOAGULATION CONSULT NOTE - [place] for Heparin Indication: ECMO  Allergies  Allergen Reactions  . Sulfa Antibiotics Rash and Other (See Comments)    Patient Measurements: Height: 5\' 4"  (162.6 cm) Weight: 121.6 kg (268 lb 1.3 oz) IBW/kg (Calculated) : 54.7 Heparin Dosing Weight: 87 kg  Vital Signs: Temp: 97.5 F (36.4 C) (08/31 0400) Temp Source: Oral (08/31 0400) BP: 157/70 (08/31 0700) Pulse Rate: 85 (08/31 0700)  Labs: Recent Labs    02/04/20 0404 02/04/20 0406 02/04/20 1548 02/04/20 2010 02/05/20 0304 02/05/20 0403 02/05/20 1700 02/05/20 2021 02/05/20 2242 02/05/20 2242 02/06/20 0306 02/06/20 0433  HGB 8.5*   < > 8.9*   < > 8.1*   < > 7.9*   < > 9.0*   < > 9.5* 10.9*  HCT 27.1*   < > 28.5*   < > 26.0*   < > 26.2*   < > 28.8*  --  30.3* 32.0*  PLT 61*   < > 72*  --  60*   < > 62*  --  56*  --  62*  --   APTT 38*   < > 40*  --  41*  --  37*  --   --   --  38*  --   LABPROT 14.0  --   --   --  13.8  --   --   --  13.8  --   --   --   INR 1.1  --   --   --  1.1  --   --   --  1.1  --   --   --   HEPARINUNFRC  --    < > <0.10*  --  <0.10*  --   --   --   --   --  <0.10*  --   CREATININE 1.10*   < > 1.01*  --  1.08*  --  0.98  --   --   --  0.91  --    < > = values in this interval not displayed.    Estimated Creatinine Clearance: 89.9 mL/min (by C-G formula based on SCr of 0.91 mg/dL).   Medical History: Past Medical History:  Diagnosis Date  . Asthma   . Diabetes mellitus without complication ([place])   . Diverticulitis   . Gallstones   . IBS (irritable bowel syndrome)   . NAFLD (nonalcoholic fatty liver disease)    CT scan 2015  . Ovarian cyst     Assessment: 54 yo female who is COVID+ on VV ECMO. Patient was started on bivalirudin but was found to have multiple ICH on 8/13 head CT. Bivalirudin was stopped at this time and pt has been off anticoagulation. Her ECMO circuit was changed 8/24, and pharmacy asked to start  low-fixed rate heparin.  Heparin level remains undetectable, aPTT remains subtherapeutic at 38, on fixed low-dose heparin 500 units/hr. Patient s/p trach placement yesterday and now having consistent oozing from trach site. Heparin has been held since 0700 this morning. Hgb 9.5, plts 62 stable this morning. ECMO circuit is stable. Planning placement of new A-line and packing around trach site. Will continue to hold heparin and follow-up when to restart.  Goal of Therapy:  Heparin level 0.2-0.5 units/ml - currently not titrating to goal per discussion with ECMO team Monitor platelets by anticoagulation protocol: Yes   Plan:  Follow-up restart heparin infusion   Check daily heparin level and aPTT Monitor daily CBC,  signs of bleeding, and ECMO circuit  Richardine Service, PharmD PGY2 Cardiology Pharmacy Resident Phone: [phone] 02/06/2020  7:25 AM  Please check [email or web address] for unit-specific pharmacy phone numbers.

## 2020-02-06 NOTE — Progress Notes (Signed)
SLP Cancellation Note  Patient Details Name: Alisha Stephenson MRN: [id number] DOB: [birth year]   Cancelled treatment:       Reason Eval/Treat Not Completed: Medical issues which prohibited therapy. Pt continues to be vent dependent, #8 trach, unable to tolerate deflated cuff. She is trying to communicate by mouthing words. Educated RT and RN to encourage pt to decrease rate and overarticulate to maximize intelligibility. She is unable to write - will try communication board.  Will continue efforts to complete PMSV and swallow evaluations when appropriate to do so.  Dennise Raabe B. Quentin Ore, [facility], [place] Speech Language Pathologist Office: [phone]  Shonna Chock 02/06/2020, 9:21 AM

## 2020-02-06 NOTE — Progress Notes (Signed)
Assisted tele visit to patient with daughter.  Janazia Schreier Samson, RN  

## 2020-02-06 NOTE — Procedures (Signed)
Extracorporeal support note   ECLS support day: 22 Indication: Covid 19 pneumonia/ARDS  Configuration: VV  Drainage cannula: right IJ [place]  Return cannula: same  Pump speed: 4000 Pump flow: 4.6 Pump used: Centrimag  Oxygenator: Quadrox O2 blender: 100% Sweep gas: 10  Circuit check: no visible clot Anticoagulant: heparin, on hold for trach oozing  Anticoagulation targets: ptt 40-60 (on hold)  Changes in support: no change in support   Anticipated goals/duration of support: bridge to recovery.   Alisha Emery MD PCCM

## 2020-02-06 NOTE — Procedures (Signed)
Arterial Catheter Insertion Procedure Note  Alisha Stephenson  [id number]  [birth year]  Date:02/06/20  Time:1:53 PM    Provider Performing: Cristal Generous    Procedure: Insertion of Arterial Line [phone]) without US guidance  Indication(s) Blood pressure monitoring and/or need for frequent ABGs  Consent Risks of the procedure as well as the alternatives and risks of each were explained to the patient and/or caregiver.  Consent for the procedure was obtained and is signed in the bedside chart  Anesthesia 1% lidocaine    Time Out Verified patient identification, verified procedure, site/side was marked, verified correct patient position, special equipment/implants available, medications/allergies/relevant history reviewed, required imaging and test results available.   Sterile Technique Maximal sterile technique including full sterile barrier drape, hand hygiene, sterile gown, sterile gloves, mask, hair covering, sterile ultrasound probe cover (if used).   Procedure Description Area of catheter insertion was cleaned with chlorhexidine and draped in sterile fashion. With real-time ultrasound guidance an arterial catheter was placed into the left radial artery.  Appropriate arterial tracings confirmed on monitor.     Complications/Tolerance None; patient tolerated the procedure well.   EBL Minimal   Specimen(s) None   Alisha Gum MSN, AGACNP-BC [place] [phone] If no answer, [phone] 02/06/2020, 1:54 PM

## 2020-02-07 ENCOUNTER — Inpatient Hospital Stay (HOSPITAL_COMMUNITY): Payer: PRIVATE HEALTH INSURANCE

## 2020-02-07 LAB — BPAM RBC
Blood Product Expiration Date: 202109212359
Blood Product Expiration Date: 202109242359
Blood Product Expiration Date: 202109242359
Blood Product Expiration Date: 202109242359
Blood Product Expiration Date: 202109262359
Blood Product Expiration Date: 202110012359
ISSUE DATE / TIME: 202108301843
ISSUE DATE / TIME: 202108311737
Unit Type and Rh: 5100
Unit Type and Rh: 5100
Unit Type and Rh: 5100
Unit Type and Rh: 5100
Unit Type and Rh: 5100
Unit Type and Rh: 5100

## 2020-02-07 LAB — CBC
HCT: 25.7 % — ABNORMAL LOW (ref 36.0–46.0)
Hemoglobin: 8.1 g/dL — ABNORMAL LOW (ref 12.0–15.0)
MCH: 32.9 pg (ref 26.0–34.0)
MCHC: 31.5 g/dL (ref 30.0–36.0)
MCV: 104.5 fL — ABNORMAL HIGH (ref 80.0–100.0)
Platelets: 81 10*3/uL — ABNORMAL LOW (ref 150–400)
RBC: 2.46 MIL/uL — ABNORMAL LOW (ref 3.87–5.11)
RDW: 26.4 % — ABNORMAL HIGH (ref 11.5–15.5)
WBC: 13.5 10*3/uL — ABNORMAL HIGH (ref 4.0–10.5)
nRBC: 7.9 % — ABNORMAL HIGH (ref 0.0–0.2)

## 2020-02-07 LAB — POCT I-STAT 7, (LYTES, BLD GAS, ICA,H+H)
Acid-Base Excess: 1 mmol/L (ref 0.0–2.0)
Acid-Base Excess: 2 mmol/L (ref 0.0–2.0)
Acid-Base Excess: 2 mmol/L (ref 0.0–2.0)
Acid-base deficit: 1 mmol/L (ref 0.0–2.0)
Acid-base deficit: 2 mmol/L (ref 0.0–2.0)
Bicarbonate: 24.6 mmol/L (ref 20.0–28.0)
Bicarbonate: 24.9 mmol/L (ref 20.0–28.0)
Bicarbonate: 26 mmol/L (ref 20.0–28.0)
Bicarbonate: 27.9 mmol/L (ref 20.0–28.0)
Bicarbonate: 28 mmol/L (ref 20.0–28.0)
Calcium, Ion: 1.13 mmol/L — ABNORMAL LOW (ref 1.15–1.40)
Calcium, Ion: 1.14 mmol/L — ABNORMAL LOW (ref 1.15–1.40)
Calcium, Ion: 1.15 mmol/L (ref 1.15–1.40)
Calcium, Ion: 1.15 mmol/L (ref 1.15–1.40)
Calcium, Ion: 1.21 mmol/L (ref 1.15–1.40)
HCT: 26 % — ABNORMAL LOW (ref 36.0–46.0)
HCT: 26 % — ABNORMAL LOW (ref 36.0–46.0)
HCT: 27 % — ABNORMAL LOW (ref 36.0–46.0)
HCT: 28 % — ABNORMAL LOW (ref 36.0–46.0)
HCT: 29 % — ABNORMAL LOW (ref 36.0–46.0)
Hemoglobin: 8.8 g/dL — ABNORMAL LOW (ref 12.0–15.0)
Hemoglobin: 8.8 g/dL — ABNORMAL LOW (ref 12.0–15.0)
Hemoglobin: 9.2 g/dL — ABNORMAL LOW (ref 12.0–15.0)
Hemoglobin: 9.5 g/dL — ABNORMAL LOW (ref 12.0–15.0)
Hemoglobin: 9.9 g/dL — ABNORMAL LOW (ref 12.0–15.0)
O2 Saturation: 82 %
O2 Saturation: 84 %
O2 Saturation: 88 %
O2 Saturation: 90 %
O2 Saturation: 95 %
Patient temperature: 35
Patient temperature: 35
Patient temperature: 35.9
Patient temperature: 94.4
Patient temperature: 96.1
Potassium: 3.8 mmol/L (ref 3.5–5.1)
Potassium: 3.8 mmol/L (ref 3.5–5.1)
Potassium: 3.9 mmol/L (ref 3.5–5.1)
Potassium: 4 mmol/L (ref 3.5–5.1)
Potassium: 4 mmol/L (ref 3.5–5.1)
Sodium: 138 mmol/L (ref 135–145)
Sodium: 139 mmol/L (ref 135–145)
Sodium: 139 mmol/L (ref 135–145)
Sodium: 139 mmol/L (ref 135–145)
Sodium: 139 mmol/L (ref 135–145)
TCO2: 26 mmol/L (ref 22–32)
TCO2: 26 mmol/L (ref 22–32)
TCO2: 27 mmol/L (ref 22–32)
TCO2: 29 mmol/L (ref 22–32)
TCO2: 29 mmol/L (ref 22–32)
pCO2 arterial: 41.5 mmHg (ref 32.0–48.0)
pCO2 arterial: 41.8 mmHg (ref 32.0–48.0)
pCO2 arterial: 42.9 mmHg (ref 32.0–48.0)
pCO2 arterial: 44.5 mmHg (ref 32.0–48.0)
pCO2 arterial: 44.7 mmHg (ref 32.0–48.0)
pH, Arterial: 7.355 (ref 7.350–7.450)
pH, Arterial: 7.377 (ref 7.350–7.450)
pH, Arterial: 7.395 (ref 7.350–7.450)
pH, Arterial: 7.397 (ref 7.350–7.450)
pH, Arterial: 7.401 (ref 7.350–7.450)
pO2, Arterial: 43 mmHg — ABNORMAL LOW (ref 83.0–108.0)
pO2, Arterial: 44 mmHg — ABNORMAL LOW (ref 83.0–108.0)
pO2, Arterial: 50 mmHg — ABNORMAL LOW (ref 83.0–108.0)
pO2, Arterial: 56 mmHg — ABNORMAL LOW (ref 83.0–108.0)
pO2, Arterial: 71 mmHg — ABNORMAL LOW (ref 83.0–108.0)

## 2020-02-07 LAB — GLUCOSE, CAPILLARY
Glucose-Capillary: 120 mg/dL — ABNORMAL HIGH (ref 70–99)
Glucose-Capillary: 146 mg/dL — ABNORMAL HIGH (ref 70–99)
Glucose-Capillary: 151 mg/dL — ABNORMAL HIGH (ref 70–99)
Glucose-Capillary: 155 mg/dL — ABNORMAL HIGH (ref 70–99)
Glucose-Capillary: 158 mg/dL — ABNORMAL HIGH (ref 70–99)
Glucose-Capillary: 161 mg/dL — ABNORMAL HIGH (ref 70–99)
Glucose-Capillary: 165 mg/dL — ABNORMAL HIGH (ref 70–99)
Glucose-Capillary: 166 mg/dL — ABNORMAL HIGH (ref 70–99)
Glucose-Capillary: 173 mg/dL — ABNORMAL HIGH (ref 70–99)
Glucose-Capillary: 176 mg/dL — ABNORMAL HIGH (ref 70–99)
Glucose-Capillary: 179 mg/dL — ABNORMAL HIGH (ref 70–99)
Glucose-Capillary: 182 mg/dL — ABNORMAL HIGH (ref 70–99)
Glucose-Capillary: 189 mg/dL — ABNORMAL HIGH (ref 70–99)
Glucose-Capillary: 189 mg/dL — ABNORMAL HIGH (ref 70–99)
Glucose-Capillary: 202 mg/dL — ABNORMAL HIGH (ref 70–99)
Glucose-Capillary: 223 mg/dL — ABNORMAL HIGH (ref 70–99)
Glucose-Capillary: 231 mg/dL — ABNORMAL HIGH (ref 70–99)
Glucose-Capillary: 232 mg/dL — ABNORMAL HIGH (ref 70–99)
Glucose-Capillary: 261 mg/dL — ABNORMAL HIGH (ref 70–99)

## 2020-02-07 LAB — BASIC METABOLIC PANEL
Anion gap: 14 (ref 5–15)
Anion gap: 10 (ref 5–15)
BUN: 32 mg/dL — ABNORMAL HIGH (ref 6–20)
BUN: 31 mg/dL — ABNORMAL HIGH (ref 6–20)
CO2: 22 mmol/L (ref 22–32)
CO2: 23 mmol/L (ref 22–32)
Calcium: 8 mg/dL — ABNORMAL LOW (ref 8.9–10.3)
Calcium: 8.2 mg/dL — ABNORMAL LOW (ref 8.9–10.3)
Chloride: 101 mmol/L (ref 98–111)
Chloride: 103 mmol/L (ref 98–111)
Creatinine, Ser: 0.79 mg/dL (ref 0.44–1.00)
Creatinine, Ser: 0.82 mg/dL (ref 0.44–1.00)
GFR calc Af Amer: >60 mL/min (ref >60)
GFR calc Af Amer: >60 mL/min (ref >60)
GFR calc non Af Amer: >60 mL/min (ref >60)
GFR calc non Af Amer: >60 mL/min (ref >60)
Glucose, Bld: 180 mg/dL — ABNORMAL HIGH (ref 70–99)
Glucose, Bld: 200 mg/dL — ABNORMAL HIGH (ref 70–99)
Potassium: 3.7 mmol/L (ref 3.5–5.1)
Potassium: 4.3 mmol/L (ref 3.5–5.1)
Sodium: 137 mmol/L (ref 135–145)
Sodium: 136 mmol/L (ref 135–145)

## 2020-02-07 LAB — TYPE AND SCREEN
ABO/RH(D): O POS
Antibody Screen: NEGATIVE
Unit division: 0
Unit division: 0
Unit division: 0
Unit division: 0
Unit division: 0
Unit division: 0

## 2020-02-07 LAB — CBC WITH DIFFERENTIAL/PLATELET
Abs Immature Granulocytes: 0.22 10*3/uL — ABNORMAL HIGH (ref 0.00–0.07)
Basophils Absolute: 0.1 10*3/uL (ref 0.0–0.1)
Basophils Relative: 0 %
Eosinophils Absolute: 0.8 10*3/uL — ABNORMAL HIGH (ref 0.0–0.5)
Eosinophils Relative: 6 %
HCT: 27.7 % — ABNORMAL LOW (ref 36.0–46.0)
Hemoglobin: 8.7 g/dL — ABNORMAL LOW (ref 12.0–15.0)
Immature Granulocytes: 2 %
Lymphocytes Relative: 12 %
Lymphs Abs: 1.7 10*3/uL (ref 0.7–4.0)
MCH: 32.1 pg (ref 26.0–34.0)
MCHC: 31.4 g/dL (ref 30.0–36.0)
MCV: 102.2 fL — ABNORMAL HIGH (ref 80.0–100.0)
Monocytes Absolute: 0.7 10*3/uL (ref 0.1–1.0)
Monocytes Relative: 5 %
Neutro Abs: 10.3 10*3/uL — ABNORMAL HIGH (ref 1.7–7.7)
Neutrophils Relative %: 75 %
Platelets: 69 10*3/uL — ABNORMAL LOW (ref 150–400)
RBC: 2.71 MIL/uL — ABNORMAL LOW (ref 3.87–5.11)
RDW: 25.5 % — ABNORMAL HIGH (ref 11.5–15.5)
WBC: 13.6 10*3/uL — ABNORMAL HIGH (ref 4.0–10.5)
nRBC: 7 % — ABNORMAL HIGH (ref 0.0–0.2)

## 2020-02-07 LAB — MAGNESIUM: Magnesium: 2.5 mg/dL — ABNORMAL HIGH (ref 1.7–2.4)

## 2020-02-07 LAB — LACTIC ACID, PLASMA
Lactic Acid, Venous: 1.8 mmol/L (ref 0.5–1.9)
Lactic Acid, Venous: 1.4 mmol/L (ref 0.5–1.9)

## 2020-02-07 LAB — FIBRINOGEN: Fibrinogen: 606 mg/dL — ABNORMAL HIGH (ref 210–475)

## 2020-02-07 LAB — PHOSPHORUS: Phosphorus: 3 mg/dL (ref 2.5–4.6)

## 2020-02-07 LAB — HEPARIN LEVEL (UNFRACTIONATED): Heparin Unfractionated: <0.1 IU/mL — ABNORMAL LOW (ref 0.30–0.70)

## 2020-02-07 LAB — LACTATE DEHYDROGENASE: LDH: 491 U/L — ABNORMAL HIGH (ref 98–192)

## 2020-02-07 LAB — APTT
aPTT: 37 seconds — ABNORMAL HIGH (ref 24–36)
aPTT: 39 seconds — ABNORMAL HIGH (ref 24–36)

## 2020-02-07 NOTE — Progress Notes (Signed)
Pharmacy Antibiotic Note  Alisha Stephenson is a 54 y.o. female admitted on 01/09/2020 with COVID pneumonia now s/p VV ECMO cannulation on 01/09/2020.    Found to have staph aureus (MSSA) + enterococcus faecalis (pan-sen). Patient has been on high-dose Zosyn given pharmacokinetic properties with ECMO. Now with concern of endocarditis. BAL from 8/24 growing candida. Pharmacy started Eraxis.   WBC remains 13.6, LA 1.2>1.8. Remains on CRRT and hypothermic on ECMO.  Plan: Continue Zosyn 4.5g IV q6 hrs - plan for end date on 9/7 Continue Eraxis 151m IV q24 hrs - plan for end date on 9/2 Monitor clinical progression, cultures, CRRT duration   Height: _0  (162.6 cm) Weight: 121 kg (266 lb 12.1 oz) IBW/kg (Calculated) : 54.7  Temp (24hrs), Avg:95.9 F (35.5 C), Min:95 F (35 C), Max:96.6 F (35.9 C)  Recent Labs  Lab 02/05/20 0304 02/05/20 0304 02/05/20 1700 02/05/20 2242 02/06/20 0306 02/06/20 1520 02/06/20 1700 02/06/20 1730 02/07/20 0305 02/07/20 0306 02/07/20 0311  WBC 10.8*   < > 8.6 8.6 11.3* 10.4  --   --   --   --  13.6*  CREATININE 1.08*  --  0.98  --  0.91  --  0.79  --  0.79  --   --   LATICACIDVEN 1.3  --  1.8  --  1.7  --   --  1.2  --  1.8  --    < > = values in this interval not displayed.    Estimated Creatinine Clearance: 101.9 mL/min (by C-G formula based on SCr of 0.79 mg/dL).    Allergies  Allergen Reactions  . Sulfa Antibiotics Rash and Other (See Comments)   Antimicrobials this admission: 8/3 CTX >> 8/6; 8/22 >>8/23 8/3 zithromax >> 8/6 8/2 Remdesivir >> 8/6 8/2 Actemra 8/2 solu-medrol 60 Q12>> 8/6 decr to 40/d > 8/12 Vanc >>8/15; 8/19 >> 8/22 8/12 Meropenem >> 8/13 8/13 Zosyn high dose  >> 8/22; 8/23 >> 8/19 Fluconazole >> 8/22 8/22 ampicillin >> 8/23 8/26 Eraxis >>  Microbiology Results:  8/25 Bcx: ngtd 8/24 BAL: abundant yeast- 10k candida dubliniensis 8/19 Bcx: neg 8/15 Bcx: neg 8/12 Bcx: enterococcus amp S 8/12 TA: abundant MSSA 8/2  BCx: NGF 8/4 UCx: ngf 8/4 Sputum: normal flora 8/3 MRSA PCR: neg 8/2 COVID: positive  KAntonietta Jewel PharmD, BCCCP Clinical Pharmacist  Phone: [phone]/06/2019 12:50 PM  Please check AMION for all [place] numbers After 10:00 PM, call [phone]

## 2020-02-07 NOTE — Progress Notes (Signed)
ANTICOAGULATION CONSULT NOTE - [place] for Heparin Indication: ECMO  Allergies  Allergen Reactions  . Sulfa Antibiotics Rash and Other (See Comments)    Patient Measurements: Height: 5\' 4"  (162.6 cm) Weight: 121 kg (266 lb 12.1 oz) IBW/kg (Calculated) : 54.7 Heparin Dosing Weight: 87 kg  Vital Signs: Temp: 95 F (35 C) (09/01 0800) Temp Source: Axillary (09/01 0800) BP: 111/48 (09/01 1200) Pulse Rate: 76 (09/01 1200)  Labs: Recent Labs    02/05/20 0304 02/05/20 0403 02/05/20 2242 02/05/20 2242 02/06/20 0306 02/06/20 0433 02/06/20 1520 02/06/20 1522 02/06/20 1700 02/06/20 2022 02/07/20 0305 02/07/20 0306 02/07/20 0311 02/07/20 0311 02/07/20 0326 02/07/20 0326 02/07/20 0847 02/07/20 1035  HGB 8.1*   < > 9.0*   < > 9.5*   < > 8.3*   < >  --    < >  --   --  8.7*   < > 9.5*   < > 9.9* 9.2*  HCT 26.0*   < > 28.8*   < > 30.3*   < > 26.7*   < >  --    < >  --   --  27.7*   < > 28.0*  --  29.0* 27.0*  PLT 60*   < > 56*   < > 62*  --  56*  --   --   --   --   --  69*  --   --   --   --   --   APTT 41*   < >  --   --  38*  --   --   --  40*  --  37*  --   --   --   --   --   --   --   LABPROT 13.8  --  13.8  --   --   --   --   --   --   --   --   --   --   --   --   --   --   --   INR 1.1  --  1.1  --   --   --   --   --   --   --   --   --   --   --   --   --   --   --   HEPARINUNFRC <0.10*  --   --   --  <0.10*  --   --   --   --   --   --  <0.10*  --   --   --   --   --   --   CREATININE 1.08*   < >  --   --  0.91  --   --   --  0.79  --  0.79  --   --   --   --   --   --   --    < > = values in this interval not displayed.    Estimated Creatinine Clearance: 101.9 mL/min (by C-G formula based on SCr of 0.79 mg/dL).   Medical History: Past Medical History:  Diagnosis Date  . Asthma   . Diabetes mellitus without complication ([place])   . Diverticulitis   . Gallstones   . IBS (irritable bowel syndrome)   . NAFLD (nonalcoholic fatty  liver disease)    CT scan 2015  . Ovarian cyst  Assessment: 54 yo female who is COVID+ on VV ECMO. Patient was started on bivalirudin but was found to have multiple ICH on 8/13 head CT. Bivalirudin was stopped at this time and pt has been off anticoagulation. Her ECMO circuit was changed 8/24, and pharmacy asked to start low-fixed rate heparin.  Heparin level remains undetectable, aPTT remains subtherapeutic at 37 - now off heparin infusion given bleeding from trach site. Hgb 9.5, plts 69 stable this morning. ECMO circuit is stable.   Goal of Therapy:  Heparin level 0.2-0.5 units/ml - currently not titrating to goal per discussion with ECMO team Monitor platelets by anticoagulation protocol: Yes   Plan:  Discussed with team, continue to hold heparin infusion at this time Plan for packing this morning and can reassess plans for heparin restart Monitor daily CBC, LDH, fibrinogen, s/sx of bleeding  Antonietta Jewel, PharmD, [place] Pharmacist  Phone: [phone] 02/07/2020 12:53 PM  Please check AMION for all [place] phone numbers After 10:00 PM, call [place]

## 2020-02-07 NOTE — Progress Notes (Signed)
Assisted tele visit to patient with husband.  Torrence Hammack Samson, RN  

## 2020-02-07 NOTE — Progress Notes (Signed)
NAME:  MARLON VONRUDEN, MRN:  [id number], DOB:  08/03/1965, LOS: 78 ADMISSION DATE:  01/16/2020, CONSULTATION DATE:  8/3 REFERRING MD:  Alfredia Ferguson, CHIEF COMPLAINT:  Dyspnea   Brief History   54 y/o female admitted on 8/2 with severe acute respiratory failure with hypoxemia due to COVID 19 pneumonia.  She developed symptoms 1 week prior to admission.  Past Medical History  DM2 Diverticulitis Gallstones Ovarian cyst NAFLD Asthma  Significant Hospital Events   8/2 admit 8/3 ICU transfer, intubated 8/4 prone, paralyze 8/9 significant desaturations today 8/9 VV ECMO cannulation 8/13 ICH 8/30 trach  Consults:  PCCM ECMO team   Procedures:  8/3 ETT > 8/30 8/30 8-0 shiley trach 8/3 PICC >  8/9 LIJ MML 8/9 RIJ Crescent 69F   Significant Diagnostic Tests:  7/31 CT head > NAICP 7/31 MRI/MRA brain > no acute changes, possibly small aneurysm ACOM 8/13 CT head> multiple areas of ICH  Micro Data:  8/2 blood > NG 8/2 SARS COV 2 > positive 8/4 resp > negative 8/4 urine >  8/12 blood > E. faecalis (pan-sensitive) 8/12 resp: staph aureus> MSSA 8/25 blood>> NG 8/24 BAL> yeast  Antimicrobials:  See fever tab for past abx Current Eraxis 8/26>> Zosyn 8/23>>  Interim history/subjective:  Still having oozing from trach site.  Otherwise awake, weak, following commands.   Objective   Blood pressure (!) 172/54, pulse 81, temperature (!) 96.6 F (35.9 C), resp. rate (!) 38, height _0  (1.626 m), weight 121 kg, SpO2 98 %. CVP:  [7 mmHg-21 mmHg] 12 mmHg  Vent Mode: PCV FiO2 (%):  [40 %] 40 % Set Rate:  [15 bmp] 15 bmp PEEP:  [8 cmH20] 8 cmH20 Plateau Pressure:  [20 cmH20-27 cmH20] 27 cmH20   Intake/Output Summary (Last 24 hours) at 02/07/2020 0748 Last data filed at 02/07/2020 0700 Gross per 24 hour  Intake 4701.63 ml  Output 3530 ml  Net 1171.63 ml   Filed Weights   02/05/20 0446 02/06/20 0413 02/07/20 0411  Weight: 122 kg 121.6 kg 121 kg    Examination:  GEN:  critically ill woman lying in bed HEENT: trach site oozing blood CV: RRR, ext warm PULM: minimal breath sounds GI: soft, hypoactive BS EXT: improving edema NEURO: moves ext to command intermittently, eyes open, diffusely weak, nods appropriately to questions PSYCH: RASS 0 SKIN: no rashes  Plts stable in 60s Sugars labile LDH 1016>>756>>491 Hgb stable after 1 unit yesterday ABG pO2 71 Vent plat 13, PC 12/12 rate 15, double triggering Lactic 1.6>>1.3>>1.8>>1.7>>1.8 Fibrinogen 756>>606 INR 1.1 APTT 37  Resolved Hospital Problem list     Assessment & Plan:     Critically ill due to Acute hypoxic and AoC hypercapneic respiratory failure; likely underly OHS requiring VV ECMO support and mechanical ventilation.  ARDS due to COVID 19 pneumonia   MSSA pneumonia Possibly fungal infection vs contamination. Clinically consistent with infection causing sepsis. Enterococcal bacteremia-recent blood cultures cleared Concern for possible embolic phenomenon- fingers and possibly CVAs were embolic  Elevated LDH, hypo-fibrinogenemia and thrombocytopenia ICH, multifocal. L-sided weakness.  Encephalopathy, improved Acute renal failure, severe uremia.  Anuric.Marland Kitchen Shock related to  RV stunning- milrinone  Septic shock- Enterococcus bacteremias Hyperglycemia > not controlled at all with Temperance insulin despite aggressive upward titration.  Assume poor absorption, potentially due to subcutaneous edema.   Remains elevated on IV.  Plan: - Continue VV support - Zosyn and eraxis with durations outlined by ID - Will try to pack around trach today - Continue  to try to pull fluid or keep even with CRRT as Bps tolerate - Guarded prognosis  Best practice:  Diet: tube feeding Pain/Anxiety/Delirium protocol (if indicated): as above VAP protocol (if indicated): yes DVT prophylaxis: SCDs , heparin on hold for trach oozing GI prophylaxis: pantoprazole Glucose control:  insulin infusion  Mobility: bed  rest Code Status: limited Family Communication: will update husband when he comes in Disposition: ICU    The patient is critically ill with multiple organ systems failure and requires high complexity decision making for assessment and support, frequent evaluation and titration of therapies, application of advanced monitoring technologies and extensive interpretation of multiple databases. Critical Care Time devoted to patient care services described in this note independent of APP/resident time (if applicable)  is 32 minutes.   Erskine Emery MD [facility] 02/07/2020 7:48 AM Personal pager: [phone] If unanswered, please page CCM On-call: [phone]

## 2020-02-07 NOTE — Progress Notes (Signed)
Alisha Stephenson ROUNDING NOTE   Subjective:   This is a 54 year old lady with history of diabetes obesity presented with acute hypoxic respiratory failure due to COVID-19 infection.  Hospitalization has been complicated by severe shock refractory hypoxia requiring ECMO and multiple infections, intracranial hemorrhage and encephalopathy.  Baseline creatinine appears to be about 0.7 mg/dL.  She developed acute shock secondary to acute tubular necrosis and started on CRRT 01/28/2020.  Status post percutaneous tracheostomy with bronchoscopic guidance 02/05/2020  Blood pressure 115/45 pulse 69 temperature 96.6 O2 sats 93% FiO2 40%  IV pressors: Milrinone, vasopressin, norepinephrine  IV amiodarone IV heparin  Labs pending   Protonix 40 mg daily, oxycodone 10 mg every 6 hours, Tradjenta 5 mg daily  Eraxis 200 mg daily, IV Zosyn 4.5 g every 6 hours  Objective:  Vital signs in last 24 hours:  Temp:  [95.6 F (35.3 C)-96.6 F (35.9 C)] 96.6 F (35.9 C) (09/01 0400) Pulse Rate:  [63-89] 64 (09/01 0600) Resp:  [28-43] 28 (08/31 2005) BP: (85-179)/(20-97) 179/97 (08/31 2005) SpO2:  [86 %-100 %] 98 % (09/01 0600) Arterial Line BP: (68-184)/(38-99) 95/45 (09/01 0600) FiO2 (%):  [40 %] 40 % (09/01 0149) Weight:  [121 kg] 121 kg (09/01 0411)  Weight change: -0.6 kg Filed Weights   02/05/20 0446 02/06/20 0413 02/07/20 0411  Weight: 122 kg 121.6 kg 121 kg    Intake/Output: I/O last 3 completed shifts: In: 5681.1 [I.V.:1955.3; Blood:737.5; NG/GT:2250; IV Piggyback:738.3] Out: 5188 [Other:4827; Stool:250]   Intake/Output this shift:  Total I/O In: 2675.8 [I.V.:732.2; Blood:246; NG/GT:825; IV Piggyback:872.6] Out: 2304 [CZYSA:6301; Stool:650]  General: Critically ill female, has ECMO running.  Not much improvement. Heart:RRR, s1s2 nl Lungs: Coarse breath sound bilateral Abdomen:soft, non-distended Extremities: Anasarca and edema improving. Dialysis Access: Temporary HD  catheter   Basic Metabolic Panel: Recent Labs  Lab 02/03/20 0313 02/03/20 0437 02/04/20 0404 02/04/20 0836 02/05/20 0304 02/05/20 0403 02/05/20 1700 02/05/20 2021 02/06/20 0306 02/06/20 0433 02/06/20 1522 02/06/20 1700 02/06/20 2022 02/07/20 0305 02/07/20 0326  NA 137   < > 137   < > 137   < > 138   < > 135   < > 138 137 137 137 139  K 3.6   < > 4.4   < > 4.3   < > 4.1   < > 3.7   < > 4.9 4.1 4.3 3.7 3.8  CL 102   < > 102   < > 102  --  103  --  101  --   --  101  --  101  --   CO2 24   < > 24   < > 23  --  25  --  24  --   --  25  --  22  --   GLUCOSE 217*   < > 198*   < > 221*  --  169*  --  187*  --   --  128*  --  180*  --   BUN 47*   < > 46*   < > 44*  --  40*  --  39*  --   --  35*  --  32*  --   CREATININE 1.15*   < > 1.10*   < > 1.08*  --  0.98  --  0.91  --   --  0.79  --  0.79  --   CALCIUM 7.8*   < > 7.9*   < > 8.1*   < >  8.0*   < > 8.0*  --   --  8.2*  --  8.0*  --   MG 2.6*  --  2.8*  --  2.8*  --   --   --  2.5*  --   --   --   --  2.5*  --   PHOS 2.6  --  3.2  --  2.9  --   --   --  3.1  --   --   --   --  3.0  --    < > = values in this interval not displayed.    Liver Function Tests: Recent Labs  Lab 02/01/20 0226 02/02/20 0330 02/03/20 0313 02/04/20 0404 02/05/20 0304  AST 245* 195* 162* 105* 73*  ALT 118* 134* 165* 158* 151*  ALKPHOS 204* 259* 252* 243* 264*  BILITOT 1.9* 1.9* 1.6* 1.8* 1.6*  PROT 4.9* 5.4* 5.7* 5.7* 5.7*  ALBUMIN 2.3* 2.4* 2.3* 2.2* 2.1*   No results for input(s): LIPASE, AMYLASE in the last 168 hours. No results for input(s): AMMONIA in the last 168 hours.  CBC: Recent Labs  Lab 02/05/20 1700 02/05/20 2021 02/05/20 2242 02/05/20 2242 02/06/20 0306 02/06/20 0433 02/06/20 1520 02/06/20 1522 02/06/20 2022 02/07/20 0311 02/07/20 0326  WBC 8.6  --  8.6  --  11.3*  --  10.4  --   --  13.6*  --   NEUTROABS  --   --   --   --   --   --   --   --   --  10.3*  --   HGB 7.9*   < > 9.0*   < > 9.5*   < > 8.3* 9.2* 8.8* 8.7*  9.5*  HCT 26.2*   < > 28.8*   < > 30.3*   < > 26.7* 27.0* 26.0* 27.7* 28.0*  MCV 105.2*  --  103.6*  --  105.6*  --  104.7*  --   --  102.2*  --   PLT 62*  --  56*  --  62*  --  56*  --   --  69*  --    < > = values in this interval not displayed.    Cardiac Enzymes: No results for input(s): CKTOTAL, CKMB, CKMBINDEX, TROPONINI in the last 168 hours.  BNP: Invalid input(s): POCBNP  CBG: Recent Labs  Lab 02/06/20 2314 02/07/20 0010 02/07/20 0117 02/07/20 0323 02/07/20 0521  GLUCAP 176* 151* 161* 176* 155*    Microbiology: Results for orders placed or performed during the hospital encounter of 02/03/2020  Blood Culture (routine x 2)     Status: None   Collection Time: 01/23/2020 10:20 AM   Specimen: BLOOD  Result Value Ref Range Status   Specimen Description   Final    BLOOD RIGHT ANTECUBITAL Performed at [facility], [address] [place], [place] [address]    Special Requests   Final    BOTTLES DRAWN AEROBIC AND ANAEROBIC Blood Culture adequate volume Performed at [facility], [address] [place], [place] [address]    Culture   Final    NO GROWTH 5 DAYS Performed at [facility], [place] [address] [place], [place] [address]    Report Status 01/13/2020 FINAL  Final  Blood Culture (routine x 2)     Status: None   Collection Time: 01/14/2020 11:19 AM   Specimen: BLOOD  Result Value Ref Range Status  Specimen Description   Final    BLOOD BLOOD RIGHT FOREARM Performed at [facility], [address] [place], [place] [address]    Special Requests   Final    BOTTLES DRAWN AEROBIC ONLY Blood Culture results may not be optimal due to an inadequate volume of blood received in culture bottles Performed at [facility], [address] [place], [place] [address]    Culture   Final    NO GROWTH 5 DAYS Performed at [facility], [place] [address] [place], [place] [address]    Report Status 01/13/2020 FINAL  Final   SARS Coronavirus 2 by RT PCR (hospital order, performed in [facility] hospital lab) Nasopharyngeal Nasopharyngeal Swab     Status: Abnormal   Collection Time: 02/01/2020 11:19 AM   Specimen: Nasopharyngeal Swab  Result Value Ref Range Status   SARS Coronavirus 2 POSITIVE (A) NEGATIVE Final    Comment: RESULT CALLED TO, READ BACK BY AND VERIFIED WITH:  SIMMS MARVA, RN @ 6333 ON 01/12/2020, CABELLERO.P (NOTE) SARS-CoV-2 target nucleic acids are DETECTED  SARS-CoV-2 RNA is generally detectable in upper respiratory specimens  during the acute phase of infection.  Positive results are indicative  of the presence of the identified virus, but do not rule out bacterial infection or co-infection with other pathogens not detected by the test.  Clinical correlation with patient history and  other diagnostic information is necessary to determine patient infection status.  The expected result is negative.  Fact Sheet for Patients:   StrictlyIdeas.no   Fact Sheet for Healthcare Providers:   BankingDealers.co.za    This test is not yet approved or cleared by the [place] FDA and  has been authorized for detection and/or diagnosis of SARS-CoV-2 by FDA under an Emergency Use Authorization (EUA).  This EUA will remain in effect  (meaning this test can be used) for the duration of  the COVID-19 declaration under Section 564(b)(1) of the Act, 21 U.S.C. section 360-bbb-3(b)(1), unless the authorization is terminated or revoked sooner.  Performed at [facility], [address] [place], [place] [address]   Culture, respiratory (non-expectorated)     Status: None   Collection Time: 01/10/20  8:32 AM   Specimen: Tracheal Aspirate; Respiratory  Result Value Ref Range Status   Specimen Description   Final    TRACHEAL ASPIRATE Performed at [place] [address], [place], [place] [address]    Special Requests    Final    NONE Performed at [facility], [place] [address] [place], [place] [address]    Gram Stain NO WBC SEEN NO ORGANISMS SEEN   Final   Culture   Final    FEW Consistent with normal respiratory flora. No Pseudomonas species isolated Performed at [place] [address], [place], [place] [address]    Report Status 01/12/2020 FINAL  Final  MRSA PCR Screening     Status: None   Collection Time: 01/10/20  6:11 PM   Specimen: Nasal Mucosa; Nasopharyngeal  Result Value Ref Range Status   MRSA by PCR NEGATIVE NEGATIVE Final    Comment:        The GeneXpert MRSA Assay (FDA approved for NASAL specimens only), is one component of a comprehensive MRSA colonization surveillance program. It is not intended to diagnose MRSA infection nor to guide or monitor treatment for MRSA infections. Performed at [facility], [place]  [address], [place], [place] [address]   Culture, Urine     Status: None   Collection Time: 01/10/20  6:11 PM   Specimen: Urine, Random  Result Value Ref Range Status   Specimen Description   Final    URINE, RANDOM Performed at [place] [address] [place], [place] [address]    Special Requests   Final    NONE Performed at [facility], [place] [address] [place], [place] [address]    Culture   Final    NO GROWTH Performed at [facility], [place] [address] [place], [place] [address]    Report Status 01/11/2020 FINAL  Final  Culture, respiratory (non-expectorated)     Status: None   Collection Time: 01/18/20  8:44 AM   Specimen: Tracheal Aspirate; Respiratory  Result Value Ref Range Status   Specimen Description TRACHEAL ASPIRATE  Final   Special Requests NONE  Final   Gram Stain   Final    NO WBC SEEN FEW GRAM POSITIVE COCCI IN PAIRS IN CLUSTERS Performed at [facility], [address] [place], [place] [address]    Culture ABUNDANT STAPHYLOCOCCUS AUREUS   Final   Report Status 01/20/2020 FINAL  Final   Organism ID, Bacteria STAPHYLOCOCCUS AUREUS  Final      Susceptibility   Staphylococcus aureus - MIC*    CIPROFLOXACIN <=0.5 SENSITIVE Sensitive     ERYTHROMYCIN >=8 RESISTANT Resistant     GENTAMICIN <=0.5 SENSITIVE Sensitive     OXACILLIN <=0.25 SENSITIVE Sensitive     TETRACYCLINE <=1 SENSITIVE Sensitive     VANCOMYCIN <=0.5 SENSITIVE Sensitive     TRIMETH/SULFA <=10 SENSITIVE Sensitive     CLINDAMYCIN RESISTANT Resistant     RIFAMPIN <=0.5 SENSITIVE Sensitive     Inducible Clindamycin POSITIVE Resistant     * ABUNDANT STAPHYLOCOCCUS AUREUS  Culture, blood (Routine X 2) w Reflex to ID Panel     Status: Abnormal   Collection Time: 01/18/20 10:17 AM   Specimen: BLOOD  Result Value Ref Range Status   Specimen Description BLOOD LEFT ANTECUBITAL  Final   Special Requests   Final    BOTTLES DRAWN AEROBIC ONLY Blood Culture adequate volume   Culture  Setup Time   Final    AEROBIC BOTTLE ONLY GRAM POSITIVE COCCI Organism ID to follow CRITICAL RESULT CALLED TO, READ BACK BY AND VERIFIED WITH: Denton Brick [facility] 01/19/20 0044 JDW Performed at [facility], [address], [place], [place] [address]    Culture ENTEROCOCCUS FAECALIS (A)  Final   Report Status 01/20/2020 FINAL  Final   Organism ID, Bacteria ENTEROCOCCUS FAECALIS  Final      Susceptibility   Enterococcus faecalis - MIC*    AMPICILLIN <=2 SENSITIVE Sensitive     VANCOMYCIN 1 SENSITIVE Sensitive     GENTAMICIN SYNERGY SENSITIVE Sensitive     * ENTEROCOCCUS FAECALIS  Blood Culture ID Panel (Reflexed)     Status: Abnormal   Collection Time: 01/18/20 10:17 AM  Result Value Ref Range Status   Enterococcus faecalis DETECTED (A) NOT DETECTED Final    Comment: CRITICAL RESULT CALLED TO, READ BACK BY AND VERIFIED WITH: G ABBOTT PHARMD 01/19/20 0044 JDW    Enterococcus Faecium NOT DETECTED NOT DETECTED Final   Listeria monocytogenes NOT DETECTED NOT DETECTED Final    Staphylococcus species NOT DETECTED NOT DETECTED Final   Staphylococcus aureus (BCID) NOT DETECTED NOT DETECTED Final   Staphylococcus epidermidis NOT  DETECTED NOT DETECTED Final   Staphylococcus lugdunensis NOT DETECTED NOT DETECTED Final   Streptococcus species NOT DETECTED NOT DETECTED Final   Streptococcus agalactiae NOT DETECTED NOT DETECTED Final   Streptococcus pneumoniae NOT DETECTED NOT DETECTED Final   Streptococcus pyogenes NOT DETECTED NOT DETECTED Final   A.calcoaceticus-baumannii NOT DETECTED NOT DETECTED Final   Bacteroides fragilis NOT DETECTED NOT DETECTED Final   Enterobacterales NOT DETECTED NOT DETECTED Final   Enterobacter cloacae complex NOT DETECTED NOT DETECTED Final   Escherichia coli NOT DETECTED NOT DETECTED Final   Klebsiella aerogenes NOT DETECTED NOT DETECTED Final   Klebsiella oxytoca NOT DETECTED NOT DETECTED Final   Klebsiella pneumoniae NOT DETECTED NOT DETECTED Final   Proteus species NOT DETECTED NOT DETECTED Final   Salmonella species NOT DETECTED NOT DETECTED Final   Serratia marcescens NOT DETECTED NOT DETECTED Final   Haemophilus influenzae NOT DETECTED NOT DETECTED Final   Neisseria meningitidis NOT DETECTED NOT DETECTED Final   Pseudomonas aeruginosa NOT DETECTED NOT DETECTED Final   Stenotrophomonas maltophilia NOT DETECTED NOT DETECTED Final   Candida albicans NOT DETECTED NOT DETECTED Final   Candida auris NOT DETECTED NOT DETECTED Final   Candida glabrata NOT DETECTED NOT DETECTED Final   Candida krusei NOT DETECTED NOT DETECTED Final   Candida parapsilosis NOT DETECTED NOT DETECTED Final   Candida tropicalis NOT DETECTED NOT DETECTED Final   Cryptococcus neoformans/gattii NOT DETECTED NOT DETECTED Final   Vancomycin resistance NOT DETECTED NOT DETECTED Final    Comment: Performed at [facility], [address] [place], [place] [address]  Culture, blood (Routine X 2) w Reflex to ID Panel     Status: Abnormal   Collection Time:  01/18/20 10:30 AM   Specimen: BLOOD LEFT ARM  Result Value Ref Range Status   Specimen Description BLOOD LEFT ARM  Final   Special Requests   Final    BOTTLES DRAWN AEROBIC ONLY Blood Culture adequate volume   Culture  Setup Time   Final    AEROBIC BOTTLE ONLY GRAM POSITIVE COCCI CRITICAL VALUE NOTED.  VALUE IS CONSISTENT WITH PREVIOUSLY REPORTED AND CALLED VALUE.    Culture (A)  Final    ENTEROCOCCUS FAECALIS SUSCEPTIBILITIES PERFORMED ON PREVIOUS CULTURE WITHIN THE LAST 5 DAYS. Performed at [facility], [place] [address] [place], [place] [address]    Report Status 01/20/2020 FINAL  Final  Culture, blood (routine x 2)     Status: None   Collection Time: 01/21/20  6:07 PM   Specimen: BLOOD LEFT HAND  Result Value Ref Range Status   Specimen Description BLOOD LEFT HAND  Final   Special Requests   Final    BOTTLES DRAWN AEROBIC AND ANAEROBIC Blood Culture adequate volume   Culture   Final    NO GROWTH 5 DAYS Performed at [facility], [place] [address] [place], [place] [address]    Report Status 01/26/2020 FINAL  Final  Culture, blood (routine x 2)     Status: None   Collection Time: 01/21/20  6:14 PM   Specimen: BLOOD LEFT FOREARM  Result Value Ref Range Status   Specimen Description BLOOD LEFT FOREARM  Final   Special Requests   Final    BOTTLES DRAWN AEROBIC ONLY Blood Culture adequate volume   Culture   Final    NO GROWTH 5 DAYS Performed at [facility], [place] [address], [place], [place] [address]    Report Status 01/26/2020 FINAL  Final  Culture, blood (Routine X 2) w Reflex to ID Panel     Status: None   Collection Time: 01/25/20  1:15 PM   Specimen: BLOOD  Result Value Ref Range Status   Specimen Description BLOOD LEFT ANTECUBITAL  Final   Special Requests   Final    BOTTLES DRAWN AEROBIC AND ANAEROBIC Blood Culture adequate volume   Culture   Final    NO GROWTH 5 DAYS Performed at [facility], [place] [address], [place], [place] [address]     Report Status 01/30/2020 FINAL  Final  Culture, blood (Routine X 2) w Reflex to ID Panel     Status: None   Collection Time: 01/25/20  1:23 PM   Specimen: BLOOD LEFT FOREARM  Result Value Ref Range Status   Specimen Description BLOOD LEFT FOREARM  Final   Special Requests   Final    BOTTLES DRAWN AEROBIC AND ANAEROBIC Blood Culture adequate volume   Culture   Final    NO GROWTH 5 DAYS Performed at [facility], [place] [address], [place], [place] [address]    Report Status 01/30/2020 FINAL  Final  SARS Coronavirus 2 by RT PCR (hospital order, performed in [facility] hospital lab) Nasopharyngeal     Status: None   Collection Time: 01/29/20  9:00 AM   Specimen: Nasopharyngeal  Result Value Ref Range Status   SARS Coronavirus 2 NEGATIVE NEGATIVE Final    Comment: (NOTE) SARS-CoV-2 target nucleic acids are NOT DETECTED.  The SARS-CoV-2 RNA is generally detectable in upper and lower respiratory specimens during the acute phase of infection. The lowest concentration of SARS-CoV-2 viral copies this assay can detect is 250 copies / mL. A negative result does not preclude SARS-CoV-2 infection and should not be used as the sole basis for treatment or other patient management decisions.  A negative result may occur with improper specimen collection / handling, submission of specimen other than nasopharyngeal swab, presence of viral mutation(s) within the areas targeted by this assay, and inadequate number of viral copies (<250 copies / mL). A negative result must be combined with clinical observations, patient history, and epidemiological information.  Fact Sheet for Patients:   StrictlyIdeas.no  Fact Sheet for Healthcare Providers: BankingDealers.co.za  This test is not yet approved or  cleared by the [place] FDA and has been authorized for detection and/or diagnosis of SARS-CoV-2 by FDA under an Emergency Use Authorization (EUA).   This EUA will remain in effect (meaning this test can be used) for the duration of the COVID-19 declaration under Section 564(b)(1) of the Act, 21 U.S.C. section 360bbb-3(b)(1), unless the authorization is terminated or revoked sooner.  Performed at [facility], [place] [address] [place], [place] [address]   Culture, bal-quantitative     Status: Abnormal   Collection Time: 01/30/20 10:36 AM   Specimen: Bronchial Alveolar Lavage; Respiratory  Result Value Ref Range Status   Specimen Description Bronch Lavag  Final   Special Requests NONE  Final   Gram Stain   Final    ABUNDANT WBC PRESENT,BOTH PMN AND MONONUCLEAR NO ORGANISMS SEEN Performed at [facility], [address], [place], [place] [address]    Culture 10,000 COLONIES/mL CANDIDA DUBLINIENSIS (A)  Final   Report Status 02/02/2020 FINAL  Final  Culture, blood (routine x 2)     Status: None   Collection Time: 01/31/20  5:36 PM   Specimen: BLOOD RIGHT HAND  Result Value Ref Range Status  Specimen Description BLOOD RIGHT HAND  Final   Special Requests   Final    BOTTLES DRAWN AEROBIC ONLY Blood Culture results may not be optimal due to an inadequate volume of blood received in culture bottles   Culture   Final    NO GROWTH 5 DAYS Performed at [facility], [place] [address], [place], [place] [address]    Report Status 02/05/2020 FINAL  Final  Culture, blood (routine x 2)     Status: None   Collection Time: 01/31/20  5:50 PM   Specimen: BLOOD RIGHT HAND  Result Value Ref Range Status   Specimen Description BLOOD RIGHT HAND  Final   Special Requests   Final    BOTTLES DRAWN AEROBIC ONLY Blood Culture results may not be optimal due to an inadequate volume of blood received in culture bottles   Culture   Final    NO GROWTH 5 DAYS Performed at [facility], [place] [address] [place], [place] [address]    Report Status 02/05/2020 FINAL  Final    Coagulation Studies: Recent Labs    02/05/20 0304  02/05/20 2242  LABPROT 13.8 13.8  INR 1.1 1.1    Urinalysis: No results for input(s): COLORURINE, LABSPEC, PHURINE, GLUCOSEU, HGBUR, BILIRUBINUR, KETONESUR, PROTEINUR, UROBILINOGEN, NITRITE, LEUKOCYTESUR in the last 72 hours.  Invalid input(s): APPERANCEUR    Imaging: DG CHEST PORT 1 VIEW  Result Date: 02/06/2020 CLINICAL DATA:  Respiratory failure.  ECMO.  Recent COVID. EXAM: PORTABLE CHEST 1 VIEW COMPARISON:  02/05/2020. FINDINGS: Tracheostomy tube, feeding tube, left IJ line, right PICC line, ECMO device in stable position. Diffuse opacification of both hemi thoraces again noted with slight aeration in both lungs noted on today's exam. Heart size cannot be evaluated. Prior cervical spine fusion. IMPRESSION: 1.  Lines and tubes including ECMO device in stable position. 2. Diffuse opacification of both hemi thoraces again noted with slight aeration of both lungs noted on today's exam. Electronically Signed   By: Marcello Moores  Register   On: 02/06/2020 06:53   DG Chest Port 1 View  Result Date: 02/05/2020 CLINICAL DATA:  Shortness of breath. EXAM: PORTABLE CHEST 1 VIEW COMPARISON:  Same day. FINDINGS: Tracheostomy tube is in good position. Feeding tube is seen entering stomach. Right-sided PICC line is unchanged. Left internal jugular catheter is unchanged. ECMO device is noted. Complete opacification of the lungs is noted bilaterally suggesting pneumonia, atelectasis or effusion. No pneumothorax is noted. Bony thorax is unremarkable. IMPRESSION: Stable support apparatus. Complete opacification of the lungs is noted bilaterally suggesting pneumonia, atelectasis or effusion. Electronically Signed   By: Marijo Conception M.D.   On: 02/05/2020 14:53     Medications:   .  prismasol BGK 4/2.5 300 mL/hr at 02/06/20 2354  . sodium chloride 500 mL (02/01/20 1958)  . albumin human 12.5 g (01/28/20 1229)  . albumin human Stopped (02/06/20 2000)  . amiodarone 30 mg/hr (02/07/20 0600)  . anidulafungin  Stopped (02/06/20 1955)  . desmopressin (DDAVP) IV for Bleeding    . dexmedetomidine (PRECEDEX) IV infusion 0.5 mcg/kg/hr (02/07/20 0600)  . feeding supplement (PIVOT 1.5 CAL) 1,000 mL (02/07/20 0650)  . heparin 500 Units/hr (02/07/20 0600)  . HYDROmorphone 0.5 mg/hr (02/07/20 0600)  . insulin 4.2 mL/hr at 02/07/20 0600  . milrinone Stopped (02/05/20 0905)  . norepinephrine (LEVOPHED) Adult infusion 2 mcg/min (02/06/20 2117)  . piperacillin-tazobactam (ZOSYN)  IV Stopped (02/07/20 0548)  . prismasol BGK 2/2.5 replacement solution 500 mL/hr at 02/06/20  2354  . prismasol BGK 4/2.5 2,000 mL/hr at 02/07/20 0606  . vasopressin Stopped (02/06/20 1741)   . sodium chloride   Intravenous Once  . sodium chloride   Intravenous Once  . vitamin C  500 mg Per Tube Daily  . B-complex with vitamin C  1 tablet Oral Daily  . chlorhexidine gluconate (MEDLINE KIT)  15 mL Mouth Rinse BID  . Chlorhexidine Gluconate Cloth  6 each Topical Daily  . clonazePAM  1 mg Per Tube Q6H  . docusate  100 mg Per Tube BID  . feeding supplement (PROSource TF)  45 mL Per Tube TID  . fentaNYL (SUBLIMAZE) injection  50 mcg Intravenous Once  . linagliptin  5 mg Per Tube Daily  . mouth rinse  15 mL Mouth Rinse 10 times per day  . oxyCODONE  10 mg Per Tube Q6H  . pantoprazole (PROTONIX) IV  40 mg Intravenous Daily  . polyethylene glycol  17 g Per Tube Daily  . sennosides  5 mL Per Tube BID  . zinc sulfate  220 mg Per Tube Daily   sodium chloride, acetaminophen (TYLENOL) oral liquid 160 mg/5 mL, albumin human, albuterol, dextrose, fentaNYL, fentaNYL (SUBLIMAZE) injection, guaiFENesin-dextromethorphan, heparin, hydrALAZINE, HYDROmorphone, hydrOXYzine, ondansetron **OR** ondansetron (ZOFRAN) IV, sodium chloride  Assessment/ Plan:  1.  Anuric AKI due to shock, sepsis causing acute tubular necrosis: Started CRRT on 8/22 for uremia and volume overload. She continues on ECMO. Continue CRRT, prognosis grim.  2.Shock due to  sepsis, Covid infection, Enterococcus bacteremia: On pressors, antibiotics per primary team.  3.Acute hypoxic and hypercapnic respiratory failure, ARDS due to COVID-19 pneumonia: Currently on ECMO for refractory hypoxemia, MSSA pneumonia.  On broad-spectrum antibiotics.  IV Zosyn and IV Eraxis appreciate assistance from infectious disease.  Status post tracheostomy 04/06/2020  3.Hypernatremia, hypervolemia: relative free water deficit.  Improved with CRRT.  4.Acute toxic metabolic encephalopathy:Multifactorial with uremia contributing. Treatment with CRRT as above.  5.Anemia, thrombocytopenia: With hemolysis likely and acute illness contributing. Transfusions per primary team.  Seen by hematologist.  6.Intracranial hemorrhage: With some left-sided weakness preceded by primary team.   7. GOC: Multiorgan failure.   family meeting on 8/25, continue full scope of treatment.  Long-term prognosis poor.  8.  Diabetes as per primary service continues on oral Tradjenta.    LOS: [place] _0 _1 :55 AM

## 2020-02-07 NOTE — Progress Notes (Signed)
Patient ID: Alisha Stephenson, female   DOB: 1965/08/19, 54 y.o.   MRN: [id number]    Advanced Heart Failure Rounding Note   Subjective:    - 8/2 COVID + test - 8/9 Cannulated for VV ECMO - 8/13 with several areas of intracranial hemorrhage. Bival stopped.  - 8/14 CT no change in [place]. Increased edema - 8/16 Extubated - 8/16 Head CT stable bleed - 8/22 CVVHD started - 8/24 reintubated - 8/24 circuit changed - 8/30 trach  Remains awake on vent. Will follow commands.  Still bleeding from trach. Heparin off.   Had vagal-like episode yesterday with drop in BP and drop in HR. Now back on NE.   Remains on CVVHD at -50. ECMO flow turned down due to chugging. Given albumin.   On Eraxis for fungal coverage (ends tomorrow), Zosyn for Enterococcal bacteremina.   CXR mildly improved this am. Personally reviewed  ECMO   Flow 3.8 L RPM 3300 Sweep9  Labs:  7.40/41/71/95% Hgb8.7 PLT 69k LDH 491 Lactic acid1.9   Objective:   Weight Range:  Vital Signs:   Temp:  [95.6 F (35.3 C)-96.6 F (35.9 C)] 96.6 F (35.9 C) (09/01 0400) Pulse Rate:  [63-84] 81 (09/01 0727) Resp:  [28-38] 38 (09/01 0727) BP: (85-179)/(20-97) 172/54 (09/01 0727) SpO2:  [86 %-100 %] 98 % (09/01 0600) Arterial Line BP: (68-184)/(38-99) 95/45 (09/01 0600) FiO2 (%):  [40 %] 40 % (09/01 0727) Weight:  [121 kg] 121 kg (09/01 0411) Last BM Date: 02/06/20  Weight change: Filed Weights   02/05/20 0446 02/06/20 0413 02/07/20 0411  Weight: 122 kg 121.6 kg 121 kg    Intake/Output:   Intake/Output Summary (Last 24 hours) at 02/07/2020 0735 Last data filed at 02/07/2020 0700 Gross per 24 hour  Intake 4706.63 ml  Output 3340 ml  Net 1366.63 ml     Physical Exam: General:  Awake on vent. Will follow commands HEENT: normal Neck: supple. RIJ ECM. L [place] trialysis. +trach with oozing Carotids 2+ bilat; no bruits. No lymphadenopathy or thryomegaly appreciated. Cor: PMI nondisplaced. Regular rate & rhythm.  No rubs, gallops or murmurs. Lungs: mild air movement  Abdomen: obese soft, nontender, nondistended. No hepatosplenomegaly. No bruits or masses. Good bowel sounds. Extremities: no cyanosis, clubbing, rash, 1-2+ edema Neuro: awake on vent follows commands not moving LUE   Telemetry: Sinus 70-80s Personally reviewed   Labs: Basic Metabolic Panel: Recent Labs  Lab 02/03/20 0313 02/03/20 0437 02/04/20 0404 02/04/20 0836 02/05/20 0304 02/05/20 0403 02/05/20 1700 02/05/20 2021 02/06/20 0306 02/06/20 0433 02/06/20 1522 02/06/20 1700 02/06/20 2022 02/07/20 0305 02/07/20 0326  NA 137   < > 137   < > 137   < > 138   < > 135   < > 138 137 137 137 139  K 3.6   < > 4.4   < > 4.3   < > 4.1   < > 3.7   < > 4.9 4.1 4.3 3.7 3.8  CL 102   < > 102   < > 102  --  103  --  101  --   --  101  --  101  --   CO2 24   < > 24   < > 23  --  25  --  24  --   --  25  --  22  --   GLUCOSE 217*   < > 198*   < > 221*  --  169*  --  187*  --   --  128*  --  180*  --   BUN 47*   < > 46*   < > 44*  --  40*  --  39*  --   --  35*  --  32*  --   CREATININE 1.15*   < > 1.10*   < > 1.08*  --  0.98  --  0.91  --   --  0.79  --  0.79  --   CALCIUM 7.8*   < > 7.9*   < > 8.1*   < > 8.0*   < > 8.0*  --   --  8.2*  --  8.0*  --   MG 2.6*  --  2.8*  --  2.8*  --   --   --  2.5*  --   --   --   --  2.5*  --   PHOS 2.6  --  3.2  --  2.9  --   --   --  3.1  --   --   --   --  3.0  --    < > = values in this interval not displayed.    Liver Function Tests: Recent Labs  Lab 02/01/20 0226 02/02/20 0330 02/03/20 0313 02/04/20 0404 02/05/20 0304  AST 245* 195* 162* 105* 73*  ALT 118* 134* 165* 158* 151*  ALKPHOS 204* 259* 252* 243* 264*  BILITOT 1.9* 1.9* 1.6* 1.8* 1.6*  PROT 4.9* 5.4* 5.7* 5.7* 5.7*  ALBUMIN 2.3* 2.4* 2.3* 2.2* 2.1*   No results for input(s): LIPASE, AMYLASE in the last 168 hours. No results for input(s): AMMONIA in the last 168 hours.  CBC: Recent Labs  Lab 02/05/20 1700 02/05/20 2021  02/05/20 2242 02/05/20 2242 02/06/20 0306 02/06/20 0433 02/06/20 1520 02/06/20 1522 02/06/20 2022 02/07/20 0311 02/07/20 0326  WBC 8.6  --  8.6  --  11.3*  --  10.4  --   --  13.6*  --   NEUTROABS  --   --   --   --   --   --   --   --   --  10.3*  --   HGB 7.9*   < > 9.0*   < > 9.5*   < > 8.3* 9.2* 8.8* 8.7* 9.5*  HCT 26.2*   < > 28.8*   < > 30.3*   < > 26.7* 27.0* 26.0* 27.7* 28.0*  MCV 105.2*  --  103.6*  --  105.6*  --  104.7*  --   --  102.2*  --   PLT 62*  --  56*  --  62*  --  56*  --   --  69*  --    < > = values in this interval not displayed.    Cardiac Enzymes: No results for input(s): CKTOTAL, CKMB, CKMBINDEX, TROPONINI in the last 168 hours.  BNP: BNP (last 3 results) No results for input(s): BNP in the last 8760 hours.  ProBNP (last 3 results) No results for input(s): PROBNP in the last 8760 hours.    Other results:  Imaging: DG CHEST PORT 1 VIEW  Result Date: 02/06/2020 CLINICAL DATA:  Respiratory failure.  ECMO.  Recent COVID. EXAM: PORTABLE CHEST 1 VIEW COMPARISON:  02/05/2020. FINDINGS: Tracheostomy tube, feeding tube, left IJ line, right PICC line, ECMO device in stable position. Diffuse opacification of both hemi thoraces again noted with slight aeration in both lungs noted on today's  exam. Heart size cannot be evaluated. Prior cervical spine fusion. IMPRESSION: 1.  Lines and tubes including ECMO device in stable position. 2. Diffuse opacification of both hemi thoraces again noted with slight aeration of both lungs noted on today's exam. Electronically Signed   By: Marcello Moores  Register   On: 02/06/2020 06:53   DG Chest Port 1 View  Result Date: 02/05/2020 CLINICAL DATA:  Shortness of breath. EXAM: PORTABLE CHEST 1 VIEW COMPARISON:  Same day. FINDINGS: Tracheostomy tube is in good position. Feeding tube is seen entering stomach. Right-sided PICC line is unchanged. Left internal jugular catheter is unchanged. ECMO device is noted. Complete opacification of the  lungs is noted bilaterally suggesting pneumonia, atelectasis or effusion. No pneumothorax is noted. Bony thorax is unremarkable. IMPRESSION: Stable support apparatus. Complete opacification of the lungs is noted bilaterally suggesting pneumonia, atelectasis or effusion. Electronically Signed   By: Marijo Conception M.D.   On: 02/05/2020 14:53     Medications:     Scheduled Medications: . sodium chloride   Intravenous Once  . sodium chloride   Intravenous Once  . vitamin C  500 mg Per Tube Daily  . B-complex with vitamin C  1 tablet Oral Daily  . chlorhexidine gluconate (MEDLINE KIT)  15 mL Mouth Rinse BID  . Chlorhexidine Gluconate Cloth  6 each Topical Daily  . clonazePAM  1 mg Per Tube Q6H  . docusate  100 mg Per Tube BID  . feeding supplement (PROSource TF)  45 mL Per Tube TID  . fentaNYL (SUBLIMAZE) injection  50 mcg Intravenous Once  . linagliptin  5 mg Per Tube Daily  . mouth rinse  15 mL Mouth Rinse 10 times per day  . oxyCODONE  10 mg Per Tube Q6H  . pantoprazole (PROTONIX) IV  40 mg Intravenous Daily  . polyethylene glycol  17 g Per Tube Daily  . sennosides  5 mL Per Tube BID  . zinc sulfate  220 mg Per Tube Daily    Infusions: .  prismasol BGK 4/2.5 300 mL/hr at 02/06/20 2354  . sodium chloride 500 mL (02/01/20 1958)  . albumin human 12.5 g (01/28/20 1229)  . albumin human Stopped (02/06/20 2000)  . amiodarone 30 mg/hr (02/07/20 0700)  . anidulafungin Stopped (02/06/20 1955)  . desmopressin (DDAVP) IV for Bleeding    . dexmedetomidine (PRECEDEX) IV infusion 0.2 mcg/kg/hr (02/07/20 0700)  . feeding supplement (PIVOT 1.5 CAL) 75 mL/hr at 02/07/20 0700  . heparin 500 Units/hr (02/07/20 0700)  . HYDROmorphone 0.5 mg/hr (02/07/20 0700)  . insulin 4.2 mL/hr at 02/07/20 0700  . milrinone Stopped (02/05/20 0905)  . norepinephrine (LEVOPHED) Adult infusion 2 mcg/min (02/06/20 2117)  . piperacillin-tazobactam (ZOSYN)  IV Stopped (02/07/20 0548)  . prismasol BGK 2/2.5  replacement solution 500 mL/hr at 02/06/20 2354  . prismasol BGK 4/2.5 2,000 mL/hr at 02/07/20 0606  . vasopressin Stopped (02/06/20 1741)    PRN Medications: sodium chloride, acetaminophen (TYLENOL) oral liquid 160 mg/5 mL, albumin human, albuterol, dextrose, fentaNYL, fentaNYL (SUBLIMAZE) injection, guaiFENesin-dextromethorphan, heparin, hydrALAZINE, HYDROmorphone, hydrOXYzine, ondansetron **OR** ondansetron (ZOFRAN) IV, sodium chloride   Assessment/Plan:   1. Acute hypoxic/hypercapneic respiratory failure in setting of severe COVID PNA/ARDS -> VV ECMO - admit 8/2 - intubation 8/3 - has received actmera (compelted 8/2), remdesivir (completed 8/6) and steroids - failed full vent support with proning/paralytic -> Cannulated for VV ECMO on 8/9 - Extubated 8/16. Reintubated 8/24 - CXR unchanged -> diffuse infiltrates no change Personally reviewed - s/p trach  8/30. Oozing from trach. Will pack again. Continue to hold heparin. TEG unrevealing - Circuit changed 8/24 due to concern for hemolysis and worsening oxygenation. Oxygenation improved. LDH down. PLTs rebounding slolwy  - CVVHD started 8/22 for volume removal and uremia. Remains anuric. Volume status much improved.Weight down ~ 40 pounds. Chugging on ECMO line. Will keep even today - CXR mildly improved. Will attempt to wean sweep a bit today as toelrated - Back on NE. Wean as tolerated.  - Awake on vent today. Seems to be having some lung recovery. Has grown increasingly weak. Not moving LUE. Grip strength much weaker on right due to immobility. Will discuss again with PT. Attempt to use standing bed more.   2. Enterococcus sepsis - back on high-dose zosyn per ID - Eraxis added on 8/26 with yeast in BAL 8/24 (ends tomorrow) Discussed dosing with PharmD personally.  3. Intracranial hemorrhage - ? Septic emboli - repeat head CT on 8/14 with stable bleeds but increased edema - neurology has seen. Suspect significant long-term injury  sustained. Will follow commands. Appears to have dense LUE weakness and possibly LLE - repeat head CT stable 8/16 - Now back on low-dose heparin to protect circuit from further clotting. Was toerlating heparin at 500u/hr. But will hold again today until trach oozing slows/stops.   - Consider TEE at some point to further evalaute. Can consider now that she is re-intubated but won't change management currently  4. Thrombocytopenia - PLTs stable at 60-70k to after circuit change - Continue to follow  5. Morbid obesity - Body mass index is Body mass index is 45.79 kg/m.  6. Poorly controlled DM2 - HgBA1c 10.7 - CBGs remain elevated - On IV insulin adjust as needed  7. PAF - intermittent episodes. Last 8/26 - In NSR on IV amio this am. Billey Gosling continue   8. AKI/azotemia - CVVHD started on 8/22 - Down 40 pounds. Chugging on ECMO - Keep even today  10. Ischemic R fingers - ? Due to septic embolic versus vascular issue - VVS following. No change  11. Acute liver failure - has underlying fatty liver - mix of conjugated/unconjugated - LFTs trending back down   CRITICAL CARE Performed by: Glori Bickers  Total critical care time: 35 minutes  Critical care time was exclusive of separately billable procedures and treating other patients.  Critical care was necessary to treat or prevent imminent or life-threatening deterioration.  Critical care was time spent personally by me (independent of midlevel providers or residents) on the following activities: development of treatment plan with patient and/or surrogate as well as nursing, discussions with consultants, evaluation of patient's response to treatment, examination of patient, obtaining history from patient or surrogate, ordering and performing treatments and interventions, ordering and review of laboratory studies, ordering and review of radiographic studies, pulse oximetry and re-evaluation of patient's condition.   Length of  Stay: 30   Glori Bickers  MD 02/07/2020, 7:35 AM  Advanced Heart Failure Team Pager [phone] (M-F; [place])  Please contact [facility] for night-coverage after hours (4p -7a ) and weekends on [email or web address]

## 2020-02-07 NOTE — Progress Notes (Signed)
Updated daughter Mendel Ryder via phone. Explained how the trach had been bleeding but the MD packed it today so it looks better. Explained that patient's overall condition is the same, no big changes for better or worse.  Assisted family to set up a video visit.

## 2020-02-07 NOTE — Procedures (Signed)
Extracorporeal support note   ECLS support day: 22 Indication: Covid 19 pneumonia/ARDS  Configuration: VV  Drainage cannula: right IJ Crescent  Return cannula: same  Pump speed: 3300 (lowered due to trach-ing) Pump flow: 3.77 Pump used: Centrimag  Oxygenator: Quadrox O2 blender: 100% Sweep gas: 9  Circuit check: no visible clot Anticoagulant: heparin, on hold for trach oozing  Anticoagulation targets: ptt 40-60 (on hold)  Changes in support: lower sweep to see how she does with increasing ventilation as CXR improving  Anticipated goals/duration of support: bridge to recovery.   Erskine Emery MD PCCM

## 2020-02-07 NOTE — Plan of Care (Signed)
Problem: Education: Goal: Knowledge of General Education information will improve Description: Including pain rating scale, medication(s)/side effects and non-pharmacologic comfort measures Outcome: Progressing   Problem: Health Behavior/Discharge Planning: Goal: Ability to manage health-related needs will improve Outcome: Progressing   Problem: Clinical Measurements: Goal: Ability to maintain clinical measurements within normal limits will improve Outcome: Progressing Goal: Will remain free from infection Outcome: Progressing Goal: Diagnostic test results will improve Outcome: Progressing Goal: Respiratory complications will improve Outcome: Progressing Goal: Cardiovascular complication will be avoided Outcome: Progressing   Problem: Activity: Goal: Risk for activity intolerance will decrease Outcome: Progressing   Problem: Nutrition: Goal: Adequate nutrition will be maintained Outcome: Progressing   Problem: Coping: Goal: Level of anxiety will decrease Outcome: Progressing   Problem: Elimination: Goal: Will not experience complications related to bowel motility Outcome: Progressing Goal: Will not experience complications related to urinary retention Outcome: Progressing   Problem: Pain Managment: Goal: General experience of comfort will improve Outcome: Progressing   Problem: Safety: Goal: Ability to remain free from injury will improve Outcome: Progressing   Problem: Skin Integrity: Goal: Risk for impaired skin integrity will decrease Outcome: Progressing   Problem: Education: Goal: Knowledge of risk factors and measures for prevention of condition will improve Outcome: Progressing   Problem: Coping: Goal: Psychosocial and spiritual needs will be supported Outcome: Progressing   Problem: Respiratory: Goal: Will maintain a patent airway Outcome: Progressing Goal: Complications related to the disease process, condition or treatment will be avoided or  minimized Outcome: Progressing   Problem: Education: Goal: Knowledge of risk factors and measures for prevention of condition will improve Outcome: Progressing   Problem: Coping: Goal: Psychosocial and spiritual needs will be supported Outcome: Progressing   Problem: Respiratory: Goal: Will maintain a patent airway Outcome: Progressing Goal: Complications related to the disease process, condition or treatment will be avoided or minimized Outcome: Progressing   Problem: Education: Goal: Ability to describe self-care measures that may prevent or decrease complications (Diabetes Survival Skills Education) will improve Outcome: Progressing Goal: Individualized Educational Video(s) Outcome: Progressing   Problem: Coping: Goal: Ability to adjust to condition or change in health will improve Outcome: Progressing   Problem: Fluid Volume: Goal: Ability to maintain a balanced intake and output will improve Outcome: Progressing   Problem: Health Behavior/Discharge Planning: Goal: Ability to identify and utilize available resources and services will improve Outcome: Progressing Goal: Ability to manage health-related needs will improve Outcome: Progressing   Problem: Metabolic: Goal: Ability to maintain appropriate glucose levels will improve Outcome: Progressing   Problem: Nutritional: Goal: Maintenance of adequate nutrition will improve Outcome: Progressing Goal: Progress toward achieving an optimal weight will improve Outcome: Progressing   Problem: Skin Integrity: Goal: Risk for impaired skin integrity will decrease Outcome: Progressing   Problem: Tissue Perfusion: Goal: Adequacy of tissue perfusion will improve Outcome: Progressing   Problem: Education: Goal: Knowledge of risk factors and measures for prevention of condition will improve Outcome: Progressing   Problem: Coping: Goal: Psychosocial and spiritual needs will be supported Outcome: Progressing    Problem: Respiratory: Goal: Will maintain a patent airway Outcome: Progressing Goal: Complications related to the disease process, condition or treatment will be avoided or minimized Outcome: Progressing   Problem: Education: Goal: Knowledge of disease or condition will improve Outcome: Progressing Goal: Knowledge of secondary prevention will improve Outcome: Progressing Goal: Knowledge of patient specific risk factors addressed and post discharge goals established will improve Outcome: Progressing Goal: Individualized Educational Video(s) Outcome: Progressing   Problem: Coping: Goal: Will verbalize  positive feelings about self Outcome: Progressing Goal: Will identify appropriate support needs Outcome: Progressing   Problem: Health Behavior/Discharge Planning: Goal: Ability to manage health-related needs will improve Outcome: Progressing   Problem: Self-Care: Goal: Ability to participate in self-care as condition permits will improve Outcome: Progressing Goal: Verbalization of feelings and concerns over difficulty with self-care will improve Outcome: Progressing Goal: Ability to communicate needs accurately will improve Outcome: Progressing   Problem: Nutrition: Goal: Risk of aspiration will decrease Outcome: Progressing Goal: Dietary intake will improve Outcome: Progressing   Problem: Intracerebral Hemorrhage Tissue Perfusion: Goal: Complications of Intracerebral Hemorrhage will be minimized Outcome: Progressing   Problem: Ischemic Stroke/TIA Tissue Perfusion: Goal: Complications of ischemic stroke/TIA will be minimized Outcome: Progressing   Problem: Spontaneous Subarachnoid Hemorrhage Tissue Perfusion: Goal: Complications of Spontaneous Subarachnoid Hemorrhage will be minimized Outcome: Progressing

## 2020-02-07 DEATH — deceased

## 2020-02-08 ENCOUNTER — Inpatient Hospital Stay (HOSPITAL_COMMUNITY): Payer: PRIVATE HEALTH INSURANCE

## 2020-02-08 LAB — POCT I-STAT 7, (LYTES, BLD GAS, ICA,H+H)
Acid-Base Excess: 0 mmol/L (ref 0.0–2.0)
Acid-Base Excess: 0 mmol/L (ref 0.0–2.0)
Acid-base deficit: 1 mmol/L (ref 0.0–2.0)
Acid-base deficit: 1 mmol/L (ref 0.0–2.0)
Acid-base deficit: 2 mmol/L (ref 0.0–2.0)
Bicarbonate: 25.1 mmol/L (ref 20.0–28.0)
Bicarbonate: 25.4 mmol/L (ref 20.0–28.0)
Bicarbonate: 24.5 mmol/L (ref 20.0–28.0)
Bicarbonate: 26.5 mmol/L (ref 20.0–28.0)
Bicarbonate: 26.3 mmol/L (ref 20.0–28.0)
Calcium, Ion: 1.14 mmol/L — ABNORMAL LOW (ref 1.15–1.40)
Calcium, Ion: 1.12 mmol/L — ABNORMAL LOW (ref 1.15–1.40)
Calcium, Ion: 1.13 mmol/L — ABNORMAL LOW (ref 1.15–1.40)
Calcium, Ion: 1.12 mmol/L — ABNORMAL LOW (ref 1.15–1.40)
Calcium, Ion: 1.16 mmol/L (ref 1.15–1.40)
HCT: 29 % — ABNORMAL LOW (ref 36.0–46.0)
HCT: 29 % — ABNORMAL LOW (ref 36.0–46.0)
HCT: 31 % — ABNORMAL LOW (ref 36.0–46.0)
HCT: 30 % — ABNORMAL LOW (ref 36.0–46.0)
HCT: 29 % — ABNORMAL LOW (ref 36.0–46.0)
Hemoglobin: 9.9 g/dL — ABNORMAL LOW (ref 12.0–15.0)
Hemoglobin: 9.9 g/dL — ABNORMAL LOW (ref 12.0–15.0)
Hemoglobin: 10.5 g/dL — ABNORMAL LOW (ref 12.0–15.0)
Hemoglobin: 10.2 g/dL — ABNORMAL LOW (ref 12.0–15.0)
Hemoglobin: 9.9 g/dL — ABNORMAL LOW (ref 12.0–15.0)
O2 Saturation: 85 %
O2 Saturation: 78 %
O2 Saturation: 83 %
O2 Saturation: 86 %
O2 Saturation: 88 %
Patient temperature: 36.8
Patient temperature: 98.1
Patient temperature: 98.1
Patient temperature: 98.1
Patient temperature: 97.7
Potassium: 4.2 mmol/L (ref 3.5–5.1)
Potassium: 4.1 mmol/L (ref 3.5–5.1)
Potassium: 4.1 mmol/L (ref 3.5–5.1)
Potassium: 4.4 mmol/L (ref 3.5–5.1)
Potassium: 4.4 mmol/L (ref 3.5–5.1)
Sodium: 139 mmol/L (ref 135–145)
Sodium: 139 mmol/L (ref 135–145)
Sodium: 140 mmol/L (ref 135–145)
Sodium: 140 mmol/L (ref 135–145)
Sodium: 140 mmol/L (ref 135–145)
TCO2: 27 mmol/L (ref 22–32)
TCO2: 27 mmol/L (ref 22–32)
TCO2: 26 mmol/L (ref 22–32)
TCO2: 28 mmol/L (ref 22–32)
TCO2: 28 mmol/L (ref 22–32)
pCO2 arterial: 46.3 mmHg (ref 32.0–48.0)
pCO2 arterial: 48.4 mmHg — ABNORMAL HIGH (ref 32.0–48.0)
pCO2 arterial: 50.5 mmHg — ABNORMAL HIGH (ref 32.0–48.0)
pCO2 arterial: 50.6 mmHg — ABNORMAL HIGH (ref 32.0–48.0)
pCO2 arterial: 47.1 mmHg (ref 32.0–48.0)
pH, Arterial: 7.342 — ABNORMAL LOW (ref 7.350–7.450)
pH, Arterial: 7.327 — ABNORMAL LOW (ref 7.350–7.450)
pH, Arterial: 7.293 — ABNORMAL LOW (ref 7.350–7.450)
pH, Arterial: 7.326 — ABNORMAL LOW (ref 7.350–7.450)
pH, Arterial: 7.352 (ref 7.350–7.450)
pO2, Arterial: 53 mmHg — ABNORMAL LOW (ref 83.0–108.0)
pO2, Arterial: 45 mmHg — ABNORMAL LOW (ref 83.0–108.0)
pO2, Arterial: 52 mmHg — ABNORMAL LOW (ref 83.0–108.0)
pO2, Arterial: 55 mmHg — ABNORMAL LOW (ref 83.0–108.0)
pO2, Arterial: 57 mmHg — ABNORMAL LOW (ref 83.0–108.0)

## 2020-02-08 LAB — PREPARE RBC (CROSSMATCH)

## 2020-02-08 LAB — CBC
HCT: 24.5 % — ABNORMAL LOW (ref 36.0–46.0)
HCT: 28.2 % — ABNORMAL LOW (ref 36.0–46.0)
HCT: 28.6 % — ABNORMAL LOW (ref 36.0–46.0)
Hemoglobin: 7.7 g/dL — ABNORMAL LOW (ref 12.0–15.0)
Hemoglobin: 8.7 g/dL — ABNORMAL LOW (ref 12.0–15.0)
Hemoglobin: 8.9 g/dL — ABNORMAL LOW (ref 12.0–15.0)
MCH: 31.8 pg (ref 26.0–34.0)
MCH: 32.8 pg (ref 26.0–34.0)
MCH: 33.3 pg (ref 26.0–34.0)
MCHC: 30.9 g/dL (ref 30.0–36.0)
MCHC: 31.1 g/dL (ref 30.0–36.0)
MCHC: 31.4 g/dL (ref 30.0–36.0)
MCV: 102.9 fL — ABNORMAL HIGH (ref 80.0–100.0)
MCV: 105.5 fL — ABNORMAL HIGH (ref 80.0–100.0)
MCV: 106.1 fL — ABNORMAL HIGH (ref 80.0–100.0)
Platelets: 104 10*3/uL — ABNORMAL LOW (ref 150–400)
Platelets: 93 10*3/uL — ABNORMAL LOW (ref 150–400)
Platelets: 99 10*3/uL — ABNORMAL LOW (ref 150–400)
RBC: 2.31 MIL/uL — ABNORMAL LOW (ref 3.87–5.11)
RBC: 2.71 MIL/uL — ABNORMAL LOW (ref 3.87–5.11)
RBC: 2.74 MIL/uL — ABNORMAL LOW (ref 3.87–5.11)
RDW: 25.8 % — ABNORMAL HIGH (ref 11.5–15.5)
RDW: 27.5 % — ABNORMAL HIGH (ref 11.5–15.5)
RDW: 27.9 % — ABNORMAL HIGH (ref 11.5–15.5)
WBC: 14.2 10*3/uL — ABNORMAL HIGH (ref 4.0–10.5)
WBC: 14.3 10*3/uL — ABNORMAL HIGH (ref 4.0–10.5)
WBC: 17.7 10*3/uL — ABNORMAL HIGH (ref 4.0–10.5)
nRBC: 11.4 % — ABNORMAL HIGH (ref 0.0–0.2)
nRBC: 13.2 % — ABNORMAL HIGH (ref 0.0–0.2)
nRBC: 19.2 % — ABNORMAL HIGH (ref 0.0–0.2)

## 2020-02-08 LAB — BASIC METABOLIC PANEL
Anion gap: 11 (ref 5–15)
Anion gap: 11 (ref 5–15)
BUN: 34 mg/dL — ABNORMAL HIGH (ref 6–20)
BUN: 33 mg/dL — ABNORMAL HIGH (ref 6–20)
CO2: 24 mmol/L (ref 22–32)
CO2: 25 mmol/L (ref 22–32)
Calcium: 8 mg/dL — ABNORMAL LOW (ref 8.9–10.3)
Calcium: 8.1 mg/dL — ABNORMAL LOW (ref 8.9–10.3)
Chloride: 104 mmol/L (ref 98–111)
Chloride: 102 mmol/L (ref 98–111)
Creatinine, Ser: 0.8 mg/dL (ref 0.44–1.00)
Creatinine, Ser: 0.87 mg/dL (ref 0.44–1.00)
GFR calc Af Amer: >60 mL/min (ref >60)
GFR calc Af Amer: >60 mL/min (ref >60)
GFR calc non Af Amer: >60 mL/min (ref >60)
GFR calc non Af Amer: >60 mL/min (ref >60)
Glucose, Bld: 135 mg/dL — ABNORMAL HIGH (ref 70–99)
Glucose, Bld: 129 mg/dL — ABNORMAL HIGH (ref 70–99)
Potassium: 4 mmol/L (ref 3.5–5.1)
Potassium: 4.5 mmol/L (ref 3.5–5.1)
Sodium: 139 mmol/L (ref 135–145)
Sodium: 138 mmol/L (ref 135–145)

## 2020-02-08 LAB — GLUCOSE, CAPILLARY
Glucose-Capillary: 122 mg/dL — ABNORMAL HIGH (ref 70–99)
Glucose-Capillary: 135 mg/dL — ABNORMAL HIGH (ref 70–99)
Glucose-Capillary: 139 mg/dL — ABNORMAL HIGH (ref 70–99)
Glucose-Capillary: 140 mg/dL — ABNORMAL HIGH (ref 70–99)
Glucose-Capillary: 141 mg/dL — ABNORMAL HIGH (ref 70–99)
Glucose-Capillary: 142 mg/dL — ABNORMAL HIGH (ref 70–99)
Glucose-Capillary: 158 mg/dL — ABNORMAL HIGH (ref 70–99)
Glucose-Capillary: 164 mg/dL — ABNORMAL HIGH (ref 70–99)
Glucose-Capillary: 167 mg/dL — ABNORMAL HIGH (ref 70–99)
Glucose-Capillary: 172 mg/dL — ABNORMAL HIGH (ref 70–99)
Glucose-Capillary: 181 mg/dL — ABNORMAL HIGH (ref 70–99)
Glucose-Capillary: 184 mg/dL — ABNORMAL HIGH (ref 70–99)
Glucose-Capillary: 189 mg/dL — ABNORMAL HIGH (ref 70–99)
Glucose-Capillary: 191 mg/dL — ABNORMAL HIGH (ref 70–99)
Glucose-Capillary: 192 mg/dL — ABNORMAL HIGH (ref 70–99)
Glucose-Capillary: 200 mg/dL — ABNORMAL HIGH (ref 70–99)
Glucose-Capillary: 202 mg/dL — ABNORMAL HIGH (ref 70–99)
Glucose-Capillary: 205 mg/dL — ABNORMAL HIGH (ref 70–99)
Glucose-Capillary: 216 mg/dL — ABNORMAL HIGH (ref 70–99)
Glucose-Capillary: 234 mg/dL — ABNORMAL HIGH (ref 70–99)
Glucose-Capillary: 238 mg/dL — ABNORMAL HIGH (ref 70–99)

## 2020-02-08 LAB — FIBRINOGEN: Fibrinogen: 595 mg/dL — ABNORMAL HIGH (ref 210–475)

## 2020-02-08 LAB — LACTIC ACID, PLASMA
Lactic Acid, Venous: 1.8 mmol/L (ref 0.5–1.9)
Lactic Acid, Venous: 1.5 mmol/L (ref 0.5–1.9)

## 2020-02-08 LAB — HEPARIN LEVEL (UNFRACTIONATED)
Heparin Unfractionated: <0.1 IU/mL — ABNORMAL LOW (ref 0.30–0.70)
Heparin Unfractionated: <0.1 IU/mL — ABNORMAL LOW (ref 0.30–0.70)

## 2020-02-08 LAB — APTT
aPTT: 41 seconds — ABNORMAL HIGH (ref 24–36)
aPTT: 49 seconds — ABNORMAL HIGH (ref 24–36)

## 2020-02-08 LAB — LACTATE DEHYDROGENASE: LDH: 484 U/L — ABNORMAL HIGH (ref 98–192)

## 2020-02-08 LAB — PHOSPHORUS: Phosphorus: 3.1 mg/dL (ref 2.5–4.6)

## 2020-02-08 LAB — MAGNESIUM: Magnesium: 2.4 mg/dL (ref 1.7–2.4)

## 2020-02-08 MED ORDER — HEPARIN (PORCINE) 25000 UT/250ML-% IV SOLN
500.0000 [IU]/h | INTRAVENOUS | Status: DC
  Administered 2020-02-08 – 2020-02-15 (×5): 500 [IU]/h via INTRAVENOUS
  Filled 2020-02-08 (×4): qty 250

## 2020-02-08 MED ORDER — SODIUM CHLORIDE 0.9 % IV SOLN
INTRAVENOUS | Status: DC | PRN
  Administered 2020-02-08 – 2020-02-09 (×2): 500 mL via INTRAVENOUS
  Administered 2020-02-10: 1000 mL via INTRAVENOUS
  Administered 2020-02-11 – 2020-02-21 (×4): 500 mL via INTRAVENOUS

## 2020-02-08 NOTE — Plan of Care (Signed)
Problem: Education: Goal: Knowledge of General Education information will improve Description: Including pain rating scale, medication(s)/side effects and non-pharmacologic comfort measures Outcome: Progressing   Problem: Health Behavior/Discharge Planning: Goal: Ability to manage health-related needs will improve Outcome: Progressing   Problem: Clinical Measurements: Goal: Ability to maintain clinical measurements within normal limits will improve Outcome: Progressing Goal: Will remain free from infection Outcome: Progressing Goal: Diagnostic test results will improve Outcome: Progressing Goal: Respiratory complications will improve Outcome: Progressing Goal: Cardiovascular complication will be avoided Outcome: Progressing   Problem: Activity: Goal: Risk for activity intolerance will decrease Outcome: Progressing   Problem: Nutrition: Goal: Adequate nutrition will be maintained Outcome: Progressing   Problem: Coping: Goal: Level of anxiety will decrease Outcome: Progressing   Problem: Elimination: Goal: Will not experience complications related to bowel motility Outcome: Progressing Goal: Will not experience complications related to urinary retention Outcome: Progressing   Problem: Pain Managment: Goal: General experience of comfort will improve Outcome: Progressing   Problem: Safety: Goal: Ability to remain free from injury will improve Outcome: Progressing   Problem: Skin Integrity: Goal: Risk for impaired skin integrity will decrease Outcome: Progressing   Problem: Education: Goal: Knowledge of risk factors and measures for prevention of condition will improve Outcome: Progressing   Problem: Coping: Goal: Psychosocial and spiritual needs will be supported Outcome: Progressing   Problem: Respiratory: Goal: Will maintain a patent airway Outcome: Progressing Goal: Complications related to the disease process, condition or treatment will be avoided or  minimized Outcome: Progressing   Problem: Education: Goal: Knowledge of risk factors and measures for prevention of condition will improve Outcome: Progressing   Problem: Coping: Goal: Psychosocial and spiritual needs will be supported Outcome: Progressing   Problem: Respiratory: Goal: Will maintain a patent airway Outcome: Progressing Goal: Complications related to the disease process, condition or treatment will be avoided or minimized Outcome: Progressing   Problem: Education: Goal: Ability to describe self-care measures that may prevent or decrease complications (Diabetes Survival Skills Education) will improve Outcome: Progressing Goal: Individualized Educational Video(s) Outcome: Progressing   Problem: Coping: Goal: Ability to adjust to condition or change in health will improve Outcome: Progressing   Problem: Fluid Volume: Goal: Ability to maintain a balanced intake and output will improve Outcome: Progressing   Problem: Health Behavior/Discharge Planning: Goal: Ability to identify and utilize available resources and services will improve Outcome: Progressing Goal: Ability to manage health-related needs will improve Outcome: Progressing   Problem: Metabolic: Goal: Ability to maintain appropriate glucose levels will improve Outcome: Progressing   Problem: Nutritional: Goal: Maintenance of adequate nutrition will improve Outcome: Progressing Goal: Progress toward achieving an optimal weight will improve Outcome: Progressing   Problem: Skin Integrity: Goal: Risk for impaired skin integrity will decrease Outcome: Progressing   Problem: Tissue Perfusion: Goal: Adequacy of tissue perfusion will improve Outcome: Progressing   Problem: Education: Goal: Knowledge of risk factors and measures for prevention of condition will improve Outcome: Progressing   Problem: Coping: Goal: Psychosocial and spiritual needs will be supported Outcome: Progressing    Problem: Respiratory: Goal: Will maintain a patent airway Outcome: Progressing Goal: Complications related to the disease process, condition or treatment will be avoided or minimized Outcome: Progressing   Problem: Education: Goal: Knowledge of disease or condition will improve Outcome: Progressing Goal: Knowledge of secondary prevention will improve Outcome: Progressing Goal: Knowledge of patient specific risk factors addressed and post discharge goals established will improve Outcome: Progressing Goal: Individualized Educational Video(s) Outcome: Progressing   Problem: Coping: Goal: Will verbalize  positive feelings about self Outcome: Progressing Goal: Will identify appropriate support needs Outcome: Progressing   Problem: Health Behavior/Discharge Planning: Goal: Ability to manage health-related needs will improve Outcome: Progressing   Problem: Self-Care: Goal: Ability to participate in self-care as condition permits will improve Outcome: Progressing Goal: Verbalization of feelings and concerns over difficulty with self-care will improve Outcome: Progressing Goal: Ability to communicate needs accurately will improve Outcome: Progressing   Problem: Nutrition: Goal: Risk of aspiration will decrease Outcome: Progressing Goal: Dietary intake will improve Outcome: Progressing   Problem: Intracerebral Hemorrhage Tissue Perfusion: Goal: Complications of Intracerebral Hemorrhage will be minimized Outcome: Progressing   Problem: Ischemic Stroke/TIA Tissue Perfusion: Goal: Complications of ischemic stroke/TIA will be minimized Outcome: Progressing   Problem: Spontaneous Subarachnoid Hemorrhage Tissue Perfusion: Goal: Complications of Spontaneous Subarachnoid Hemorrhage will be minimized Outcome: Progressing

## 2020-02-08 NOTE — Progress Notes (Signed)
ANTICOAGULATION CONSULT NOTE - [place] for Heparin Indication: ECMO  Allergies  Allergen Reactions  . Sulfa Antibiotics Rash and Other (See Comments)    Patient Measurements: Height: 5\' 4"  (162.6 cm) Weight: 130.6 kg (287 lb 14.7 oz) IBW/kg (Calculated) : 54.7 Heparin Dosing Weight: 87 kg  Vital Signs: Temp: 97.9 F (36.6 C) (09/02 1200) Temp Source: Oral (09/02 1200) BP: 145/54 (09/02 1415) Pulse Rate: 99 (09/02 1800)  Labs: Recent Labs    02/05/20 2242 02/06/20 0306 02/07/20 0305 02/07/20 0306 02/07/20 0311 02/07/20 1717 02/07/20 1800 02/07/20 2025 02/08/20 0008 02/08/20 0008 02/08/20 0414 02/08/20 0415 02/08/20 0513 02/08/20 0659 02/08/20 0659 02/08/20 0859 02/08/20 1006 02/08/20 1420  HGB 9.0*   < >  --   --    < >  --  8.1*   < > 7.7*   < > 8.7*  --    < > 9.9*   < > 10.5* 10.2*  --   HCT 28.8*   < >  --   --    < >  --  25.7*   < > 24.5*   < > 28.2*  --    < > 29.0*  --  31.0* 30.0*  --   PLT 56*   < >  --   --    < >  --  81*  --  93*  --  99*  --   --   --   --   --   --   --   APTT  --    < > 37*  --    < > 39*  --   --   --   --  41*  --   --   --   --   --   --  49*  LABPROT 13.8  --   --   --   --   --   --   --   --   --   --   --   --   --   --   --   --   --   INR 1.1  --   --   --   --   --   --   --   --   --   --   --   --   --   --   --   --   --   HEPARINUNFRC  --    < >  --  <0.10*  --   --   --   --   --   --   --  <0.10*  --   --   --   --   --  <0.10*  CREATININE  --    < > 0.79  --   --   --  0.82  --   --   --  0.80  --   --   --   --   --   --   --    < > = values in this interval not displayed.    Estimated Creatinine Clearance: 106.7 mL/min (by C-G formula based on SCr of 0.8 mg/dL).   Medical History: Past Medical History:  Diagnosis Date  . Asthma   . Diabetes mellitus without complication ([place])   . Diverticulitis   . Gallstones   . IBS (irritable bowel syndrome)   . NAFLD (nonalcoholic fatty  liver disease)  CT scan 2015  . Ovarian cyst     Assessment: 54 yo female who is COVID+ on VV ECMO. Patient was started on bivalirudin but was found to have multiple ICH on 8/13 head CT. Bivalirudin was stopped at this time and pt has been off anticoagulation. Her ECMO circuit was changed 8/24, and pharmacy asked to start low-fixed rate heparin.  Heparin infusion has been off since 8/31 due to bleeding from trach site. Hgb 9.9, plts increased to 99 this morning. Fibrinogen 595, LDH 484. ECMO circuit is stable - mild clots in circuit corners.   Heparin resumed at 500 units/hr earlier today with plans to not titrate for now. Labs this afternoon show HL<0.1, aPTT 49, Hgb up to 10.2  Some increased bleeding around the trach site noted but per discussion with RN "not significant enough" yet. Will continue to monitor closely.  Goal of Therapy:  Heparin level 0.2-0.5 units/ml - currently not titrating to goal per discussion with ECMO team Monitor platelets by anticoagulation protocol: Yes   Plan:  Continue Heparin at 500 units/hr  Monitor bleeding around trach site closely Monitor daily CBC, LDH, fibrinogen, s/sx of bleeding  Thank you for allowing pharmacy to be a part of this patient's care.  Alycia Rossetti, PharmD, BCPS Clinical Pharmacist Clinical phone for 02/08/2020: [phone] 02/08/2020 6:52 PM   **Pharmacist phone directory can now be found on [email or web address] ([email or web address]).  Listed under [place].

## 2020-02-08 NOTE — Procedures (Signed)
Extracorporeal support note   ECLS support day: 23 (cannulated) Indication: Covid 19 pneumonia/ARDS  Configuration: VV  Drainage cannula: right IJ Crescent  Return cannula: same  Pump speed: 3800 (lowered due to trach bleeding) Pump flow: 4.45 Pump used: Centrimag  Oxygenator: Quadrox O2 blender: 100% Sweep gas: 10  Circuit check: no visible clot Anticoagulant: To resume heparin today and watch for trach bleeding Anticoagulation targets: ptt 40-60 (resuming today)  Changes in support: increase sweep and flow rate on circuit  Anticipated goals/duration of support: bridge to recovery  Julian Hy, DO 02/08/20 9:09 AM [place] [facility]

## 2020-02-08 NOTE — Progress Notes (Signed)
Patient ID: Alisha Stephenson, female   DOB: [birth year], 54 y.o.   MRN: [id number]    Advanced Heart Failure Rounding Note   Subjective:    - 8/2 COVID + test - 8/9 Cannulated for VV ECMO - 8/13 with several areas of intracranial hemorrhage. Bival stopped.  - 8/14 CT no change in [place]. Increased edema - 8/16 Extubated - 8/16 Head CT stable bleed - 8/22 CVVHD started - 8/24 reintubated - 8/24 circuit changed - 8/30 trach  Remains awake on vent. Will follow commands. Very weak. Cannot move upper extremities much  Trach site stopped bleeding. Heparin remains off. Got 1u RBC. Had some chugging over night.   On CVVH kept even overnight.  Norepi up to 10  On Eraxis for fungal coverage (ends today), Zosyn for Enterococcal bacteremina.   CXR no change Personally reviewed   ECMO   Flow 3.8 L RPM 4.45 Sweep9  Labs:  7.32/48/45/78% Hgb8.7 PLT 99k LDH 484 Lactic acid1.8   Objective:   Weight Range:  Vital Signs:   Temp:  [94.4 F (34.7 C)-98.2 F (36.8 C)] 98.1 F (36.7 C) (09/02 0700) Pulse Rate:  [71-103] 103 (09/02 0732) Resp:  [17-37] 24 (09/02 0732) BP: (89-151)/(42-69) 138/50 (09/02 0732) SpO2:  [77 %-96 %] 83 % (09/02 0732) Arterial Line BP: (93-161)/(43-57) 122/51 (09/02 0715) FiO2 (%):  [40 %] 40 % (09/02 0732) Weight:  [130.6 kg] 130.6 kg (09/02 0530) Last BM Date: 02/07/20 (stool in rectal tube)  Weight change: Filed Weights   02/06/20 0413 02/07/20 0411 02/08/20 0530  Weight: 121.6 kg 121 kg 130.6 kg    Intake/Output:   Intake/Output Summary (Last 24 hours) at 02/08/2020 0751 Last data filed at 02/08/2020 0700 Gross per 24 hour  Intake 4701.23 ml  Output 4103 ml  Net 598.23 ml     Physical Exam: General:  Awake on vent. Will follow commands but cant move her upper body HEENT: normal  Neck: supple. + trach. + RIJ ECMO cannula + L Wilson trialysis Carotids 2+ bilat; no bruits. No lymphadenopathy or thryomegaly appreciated. Cor: PMI nondisplaced.  Regular rate & rhythm. No rubs, gallops or murmurs. Lungs: minimal air movement Abdomen: obese soft, nontender, nondistended. No hepatosplenomegaly. No bruits or masses. Good bowel sounds. Extremities: no cyanosis, clubbing, rash, 2+ edema Neuro: awake following commands can't move upper body. Will wiggle toes    Telemetry: Sinus 90-100s Personally reviewed   Labs: Basic Metabolic Panel: Recent Labs  Lab 02/04/20 0404 02/04/20 0836 02/05/20 0304 02/05/20 0403 02/06/20 0306 02/06/20 0433 02/06/20 1700 02/06/20 2022 02/07/20 0305 02/07/20 0326 02/07/20 1800 02/07/20 1800 02/07/20 2025 02/07/20 2357 02/08/20 0414 02/08/20 0513 02/08/20 0659  NA 137   < > 137   < > 135   < > 137   < > 137   < > 136   < > 139 139 139 139 139  K 4.4   < > 4.3   < > 3.7   < > 4.1   < > 3.7   < > 4.3   < > 3.9 4.0 4.0 4.2 4.1  CL 102   < > 102   < > 101  --  101  --  101  --  103  --   --   --  104  --   --   CO2 24   < > 23   < > 24  --  25  --  22  --  23  --   --   --  24  --   --   GLUCOSE 198*   < > 221*   < > 187*  --  128*  --  180*  --  200*  --   --   --  135*  --   --   BUN 46*   < > 44*   < > 39*  --  35*  --  32*  --  31*  --   --   --  34*  --   --   CREATININE 1.10*   < > 1.08*   < > 0.91  --  0.79  --  0.79  --  0.82  --   --   --  0.80  --   --   CALCIUM 7.9*   < > 8.1*   < > 8.0*   < > 8.2*   < > 8.0*  --  8.2*  --   --   --  8.0*  --   --   MG 2.8*  --  2.8*  --  2.5*  --   --   --  2.5*  --   --   --   --   --  2.4  --   --   PHOS 3.2  --  2.9  --  3.1  --   --   --  3.0  --   --   --   --   --  3.1  --   --    < > = values in this interval not displayed.    Liver Function Tests: Recent Labs  Lab 02/02/20 0330 02/03/20 0313 02/04/20 0404 02/05/20 0304  AST 195* 162* 105* 73*  ALT 134* 165* 158* 151*  ALKPHOS 259* 252* 243* 264*  BILITOT 1.9* 1.6* 1.8* 1.6*  PROT 5.4* 5.7* 5.7* 5.7*  ALBUMIN 2.4* 2.3* 2.2* 2.1*   No results for input(s): LIPASE, AMYLASE in the last  168 hours. No results for input(s): AMMONIA in the last 168 hours.  CBC: Recent Labs  Lab 02/06/20 1520 02/06/20 1522 02/07/20 0311 02/07/20 0326 02/07/20 1800 02/07/20 2025 02/07/20 2357 02/08/20 0008 02/08/20 0414 02/08/20 0513 02/08/20 0659  WBC 10.4  --  13.6*  --  13.5*  --   --  14.3* 14.2*  --   --   NEUTROABS  --   --  10.3*  --   --   --   --   --   --   --   --   HGB 8.3*   < > 8.7*   < > 8.1*   < > 8.8* 7.7* 8.7* 9.9* 9.9*  HCT 26.7*   < > 27.7*   < > 25.7*   < > 26.0* 24.5* 28.2* 29.0* 29.0*  MCV 104.7*  --  102.2*  --  104.5*  --   --  106.1* 102.9*  --   --   PLT 56*  --  69*  --  81*  --   --  93* 99*  --   --    < > = values in this interval not displayed.    Cardiac Enzymes: No results for input(s): CKTOTAL, CKMB, CKMBINDEX, TROPONINI in the last 168 hours.  BNP: BNP (last 3 results) No results for input(s): BNP in the last 8760 hours.  ProBNP (last 3 results) No results for input(s): PROBNP in the last 8760 hours.    Other results:  Imaging: DG CHEST PORT 1 VIEW  Result Date: 02/07/2020 CLINICAL DATA:  COVID pneumonia with respiratory failure.  ECMO. EXAM: PORTABLE CHEST 1 VIEW COMPARISON:  Multiple recent previous exams. FINDINGS: The diffuse confluent airspace opacity in both lungs is again noted with some slight interval improvement at the left upper lung. Tracheostomy tube remains in place. A feeding tube passes into the stomach although the distal tip position is not included on the film. Left IJ central line tip overlies the innominate vein confluence. Left subclavian central line tip overlies the central left subclavian vein. Right PICC line tip overlies the mid SVC level. Telemetry leads overlie the chest. ECMO cannula again noted. IMPRESSION: Slight interval improvement in the left upper lung. Otherwise no substantial change in exam. Electronically Signed   By: Misty Stanley M.D.   On: 02/07/2020 08:54     Medications:     Scheduled  Medications: . sodium chloride   Intravenous Once  . sodium chloride   Intravenous Once  . vitamin C  500 mg Per Tube Daily  . B-complex with vitamin C  1 tablet Oral Daily  . chlorhexidine gluconate (MEDLINE KIT)  15 mL Mouth Rinse BID  . Chlorhexidine Gluconate Cloth  6 each Topical Daily  . clonazePAM  1 mg Per Tube Q6H  . docusate  100 mg Per Tube BID  . feeding supplement (PROSource TF)  45 mL Per Tube TID  . fentaNYL (SUBLIMAZE) injection  50 mcg Intravenous Once  . linagliptin  5 mg Per Tube Daily  . mouth rinse  15 mL Mouth Rinse 10 times per day  . oxyCODONE  10 mg Per Tube Q6H  . pantoprazole (PROTONIX) IV  40 mg Intravenous Daily  . polyethylene glycol  17 g Per Tube Daily  . sennosides  5 mL Per Tube BID  . zinc sulfate  220 mg Per Tube Daily    Infusions: .  prismasol BGK 4/2.5 300 mL/hr at 02/06/20 2354  . sodium chloride 10 mL/hr at 02/08/20 0700  . sodium chloride 10 mL/hr at 02/08/20 0700  . albumin human 12.5 g (02/07/20 1424)  . albumin human Stopped (02/06/20 2000)  . amiodarone 30 mg/hr (02/08/20 0700)  . anidulafungin Stopped (02/07/20 1845)  . dexmedetomidine (PRECEDEX) IV infusion 0.4 mcg/kg/hr (02/08/20 0700)  . feeding supplement (PIVOT 1.5 CAL) 1,000 mL (02/07/20 2330)  . heparin Stopped (02/07/20 0659)  . HYDROmorphone 0.5 mg/hr (02/08/20 0700)  . insulin 16 mL/hr at 02/08/20 0700  . milrinone Stopped (02/05/20 0905)  . norepinephrine (LEVOPHED) Adult infusion 14 mcg/min (02/08/20 0700)  . piperacillin-tazobactam (ZOSYN)  IV Stopped (02/08/20 0553)  . prismasol BGK 2/2.5 replacement solution 500 mL/hr at 02/07/20 2044  . prismasol BGK 4/2.5 2,000 mL/hr at 02/08/20 0609  . vasopressin Stopped (02/06/20 1741)    PRN Medications: sodium chloride, sodium chloride, acetaminophen (TYLENOL) oral liquid 160 mg/5 mL, albumin human, albuterol, dextrose, fentaNYL, fentaNYL (SUBLIMAZE) injection, guaiFENesin-dextromethorphan, heparin, hydrALAZINE,  HYDROmorphone, hydrOXYzine, ondansetron **OR** ondansetron (ZOFRAN) IV, sodium chloride   Assessment/Plan:   1. Acute hypoxic/hypercapneic respiratory failure in setting of severe COVID PNA/ARDS -> VV ECMO - admit 8/2 - intubation 8/3 - has received actmera (compelted 8/2), remdesivir (completed 8/6) and steroids - failed full vent support with proning/paralytic -> Cannulated for VV ECMO on 8/9 - Extubated 8/16. Reintubated 8/24 - CXR unchanged -> diffuse infiltrates no change Personally reviewed - s/p trach 8/30. Oozing from trach. Will pack again. Continue to hold heparin. TEG unrevealing - Circuit changed 8/24 due to concern for hemolysis and  worsening oxygenation. Oxygenation improved. LDH down. PLTs rebounding slolwy  - CVVHD started 8/22 for volume removal and uremia. Remains anuric. Volume status much improved.Weight down ~ 40 pounds. Fluid up a bit. Will try to pull - 50 - Back on NE. If pressor demands continue to climb will reculture - Awake on vent today. Seems to be having some lung recovery. Has grown increasingly weak. Not moving upper body. Suspect severe critical illness myopathy. PT to see today.  Lurline Idol site ok. Restart heparin - sats low today. Increase ECMO flow as tolerated  2. Enterococcus sepsis - back on high-dose zosyn per ID - Eraxis added on 8/26 with yeast in BAL 8/24 (ends today) Discussed dosing with PharmD personally. - if pressor requirement going up will reculture  3. Intracranial hemorrhage - ? Septic emboli - repeat head CT on 8/14 with stable bleeds but increased edema - neurology has seen. Suspect significant long-term injury sustained. Will follow commands. Appears to have dense LUE weakness and possibly LLE - repeat head CT stable 8/16 - restart low-dose heparin today - Consider TEE at some point to further evalaute. Can consider now that she is re-intubated but won't change management currently  4. Thrombocytopenia - PLTs up to 99k -  Continue to follow  5. Morbid obesity - Body mass index is Body mass index is 49.42 kg/m.  6. Poorly controlled DM2 - HgBA1c 10.7 - CBGs remain elevated - On IV insulin adjust as needed  7. PAF - intermittent episodes. Last 8/26 - In NSR on IV amio this am. Billey Gosling continue   8. AKI/azotemia - CVVHD started on 8/22 - Down 40 pounds.  - Pull 50 if possible  10. Ischemic R fingers - ? Due to septic embolic versus vascular issue - VVS following. No change  11. Acute liver failure - has underlying fatty liver - mix of conjugated/unconjugated - LFTs trending back down   CRITICAL CARE Performed by: Glori Bickers  Total critical care time: 35 minutes  Critical care time was exclusive of separately billable procedures and treating other patients.  Critical care was necessary to treat or prevent imminent or life-threatening deterioration.  Critical care was time spent personally by me (independent of midlevel providers or residents) on the following activities: development of treatment plan with patient and/or surrogate as well as nursing, discussions with consultants, evaluation of patient's response to treatment, examination of patient, obtaining history from patient or surrogate, ordering and performing treatments and interventions, ordering and review of laboratory studies, ordering and review of radiographic studies, pulse oximetry and re-evaluation of patient's condition.   Length of Stay: 65   Ysabel Cowgill  MD 02/08/2020, 7:51 AM  Advanced Heart Failure Team Pager [phone] (M-F; 7a - 4p)  Please contact [facility] for night-coverage after hours (4p -7a ) and weekends on [email or web address]

## 2020-02-08 NOTE — Progress Notes (Signed)
ANTICOAGULATION CONSULT NOTE - [place] for Heparin Indication: ECMO  Allergies  Allergen Reactions  . Sulfa Antibiotics Rash and Other (See Comments)    Patient Measurements: Height: 5\' 4"  (162.6 cm) Weight: 130.6 kg (287 lb 14.7 oz) IBW/kg (Calculated) : 54.7 Heparin Dosing Weight: 87 kg  Vital Signs: Temp: 98.1 F (36.7 C) (09/02 0700) Temp Source: Axillary (09/02 0700) BP: 138/50 (09/02 0732) Pulse Rate: 103 (09/02 0732)  Labs: Recent Labs    02/05/20 2242 02/05/20 2242 02/06/20 0306 02/06/20 0433 02/07/20 0305 02/07/20 0306 02/07/20 0311 02/07/20 1717 02/07/20 1800 02/07/20 2025 02/08/20 0008 02/08/20 0008 02/08/20 0414 02/08/20 0414 02/08/20 0415 02/08/20 0513 02/08/20 0659  HGB 9.0*   < > 9.5*   < >  --   --    < >  --  8.1*   < > 7.7*   < > 8.7*   < >  --  9.9* 9.9*  HCT 28.8*   < > 30.3*   < >  --   --    < >  --  25.7*   < > 24.5*   < > 28.2*  --   --  29.0* 29.0*  PLT 56*   < > 62*   < >  --   --    < >  --  81*  --  93*  --  99*  --   --   --   --   APTT  --   --  38*   < > 37*  --   --  39*  --   --   --   --  41*  --   --   --   --   LABPROT 13.8  --   --   --   --   --   --   --   --   --   --   --   --   --   --   --   --   INR 1.1  --   --   --   --   --   --   --   --   --   --   --   --   --   --   --   --   HEPARINUNFRC  --   --  <0.10*  --   --  <0.10*  --   --   --   --   --   --   --   --  <0.10*  --   --   CREATININE  --   --  0.91   < > 0.79  --   --   --  0.82  --   --   --  0.80  --   --   --   --    < > = values in this interval not displayed.    Estimated Creatinine Clearance: 106.7 mL/min (by C-G formula based on SCr of 0.8 mg/dL).   Medical History: Past Medical History:  Diagnosis Date  . Asthma   . Diabetes mellitus without complication ([place])   . Diverticulitis   . Gallstones   . IBS (irritable bowel syndrome)   . NAFLD (nonalcoholic fatty liver disease)    CT scan 2015  . Ovarian cyst      Assessment: 54 yo female who is COVID+ on VV ECMO. Patient was started on bivalirudin but was found to  have multiple ICH on 8/13 head CT. Bivalirudin was stopped at this time and pt has been off anticoagulation. Her ECMO circuit was changed 8/24, and pharmacy asked to start low-fixed rate heparin.  Heparin infusion has been off since 8/31 due to bleeding from trach site. Hgb 9.9, plts increased to 99 this morning. Fibrinogen 595, LDH 484. ECMO circuit is stable - mild clots in circuit corners.   Goal of Therapy:  Heparin level 0.2-0.5 units/ml - currently not titrating to goal per discussion with ECMO team Monitor platelets by anticoagulation protocol: Yes   Plan:  Discussed with team, will restart heparin infusion at 500 units/hr  Monitor daily CBC, LDH, fibrinogen, s/sx of bleeding  Antonietta Jewel, PharmD, [facility] Pharmacist  Phone: [phone] 02/08/2020 8:02 AM  Please check AMION for all [facility] phone numbers After 10:00 PM, call [facility] [phone]

## 2020-02-08 NOTE — Progress Notes (Signed)
Physical Therapy Treatment Patient Details Name: JOVONNE WILTON MRN: [id number] DOB: Aug 23, 1965 Today's Date: 02/08/2020    History of Present Illness 54 yo admitted 8/2 with hypoxemia due to Covid 19 PNA. 8/3 intubated. 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 CRRT initiated 8/22. Reintubated 8/24 with ECMO circuit changed, 8/30 trach. PMHx: DM, diverticulitis, Asthma    PT Comments    Pt awake and participating throughout session. Dee sitting in chair position on arrival and had been upright for several hours. Active movement of right quad, ankle, and toes with trace activation of right hip flexors. Pt with active movement of wiggling left toes 2/5 trials. Pt also with RUE elbow flexion/ extension and grip performed. Tilted to 28degrees for 7 min with pt tolerating well maintaining SpO2 85-91% and HR 99. Pt with left lean and bil knee extension in tilt who will benefit from wedge at left trunk and towel roll under knees to prevent hyperextension prior to next tilt. RN present throughout as well as spouse. Will continue to follow.     Follow Up Recommendations  LTACH     Equipment Recommendations  Other (comment) (TBD)    Recommendations for Other Services       Precautions / Restrictions Precautions Precautions: Other (comment) Precaution Comments: ECMO with RIJ access anchors at right shoulder and chest, right necrotic thumb and index finger, bruised/necrotic left hand, CRRT left subclavian, trach, vent, flexiseal Other Brace: Prevalon Boots Restrictions Weight Bearing Restrictions: No    Mobility  Bed Mobility               General bed mobility comments: total +2 assist to slide in bed to position for tilt and toward HOB. Tilt to 28 degrees.  Transfers                 General transfer comment: unable to attempt  Ambulation/Gait                 Stairs             Wheelchair Mobility     Modified Rankin (Stroke Patients Only)       Balance Overall balance assessment: Needs assistance           Standing balance-Leahy Scale: Zero Standing balance comment: left lean in tilt without attempts to use RUE to correct                            Cognition Arousal/Alertness: Awake/alert Behavior During Therapy: Flat affect Overall Cognitive Status: Difficult to assess Area of Impairment: Following commands                       Following Commands: Follows one step commands inconsistently       General Comments: pt with eyes open throughout HEP and actively partipating with right side. Pt with fatigue and eyes closing during tilt, Facial expressions and limited nodding to communicate      Exercises General Exercises - Upper Extremity Elbow Flexion: AAROM;Right;Seated;5 reps General Exercises - Lower Extremity Ankle Circles/Pumps: AAROM;PROM;10 reps;Seated (AAROM on Rt) Long Arc Quad: PROM;Both;Seated;10 reps (pt engaging rt quad to activate with significant assist to clear surface) Hip Flexion/Marching: PROM;Both;Seated;10 reps    General Comments        Pertinent Vitals/Pain Pain Assessment: No/denies pain Faces Pain Scale: Hurts a little bit Pain Location: left heel, prior achilles tendon sx Pain Descriptors /  Indicators: Grimacing Pain Intervention(s): Monitored during session;Repositioned    Home Living                      Prior Function            PT Goals (current goals can now be found in the care plan section) Progress towards PT goals: Progressing toward goals    Frequency    Min 2X/week      PT Plan Current plan remains appropriate    Co-evaluation              AM-PAC PT "6 Clicks" Mobility   Outcome Measure  Help needed turning from your back to your side while in a flat bed without using bedrails?: Total Help needed moving from lying on your back to sitting on the side of a flat bed  without using bedrails?: Total Help needed moving to and from a bed to a chair (including a wheelchair)?: Total Help needed standing up from a chair using your arms (e.g., wheelchair or bedside chair)?: Total Help needed to walk in hospital room?: Total Help needed climbing 3-5 steps with a railing? : Total 6 Click Score: 6    End of Session   Activity Tolerance: Patient tolerated treatment well Patient left: in bed;with nursing/sitter in room;with family/visitor present Nurse Communication: Mobility status;Need for lift equipment PT Visit Diagnosis: Other abnormalities of gait and mobility (R26.89);Muscle weakness (generalized) (M62.81)     Time: 6720-9470 PT Time Calculation (min) (ACUTE ONLY): 29 min  Charges:  $Therapeutic Activity: 23-37 mins                     Zaharah Amir P, PT Acute Rehabilitation Services Pager: [phone] Office: [place] 02/08/2020, 11:50 AM

## 2020-02-08 NOTE — Plan of Care (Signed)
  Problem: Clinical Measurements: Goal: Diagnostic test results will improve Outcome: Progressing   Problem: Clinical Measurements: Goal: Cardiovascular complication will be avoided Outcome: Progressing   Problem: Activity: Goal: Risk for activity intolerance will decrease Outcome: Progressing   Problem: Nutrition: Goal: Adequate nutrition will be maintained Outcome: Progressing   Problem: Coping: Goal: Level of anxiety will decrease Outcome: Progressing

## 2020-02-08 NOTE — Progress Notes (Signed)
NAME:  Alisha Stephenson, MRN:  [id number], DOB:  [birth year], LOS: 21 ADMISSION DATE:  01/25/2020, CONSULTATION DATE:  8/3 REFERRING MD:  Alfredia Ferguson, CHIEF COMPLAINT:  Dyspnea   Brief History   54 y/o female admitted on 8/2 with severe acute respiratory failure with hypoxemia due to COVID 19 pneumonia.  She developed symptoms 1 week prior to admission.  Past Medical History  DM2 Diverticulitis Gallstones Ovarian cyst NAFLD Asthma  Significant Hospital Events   8/2 admit 8/3 ICU transfer, intubated 8/4 prone, paralyze 8/9 significant desaturations today 8/9 VV ECMO cannulation 8/13 ICH 8/30 trach  Consults:  PCCM ECMO team   Procedures:  8/3 ETT > 8/30 8/30 8-0 shiley trach 8/3 PICC >  8/9 LIJ MML 8/9 RIJ [place] 68F   Significant Diagnostic Tests:  7/31 CT head > NAICP 7/31 MRI/MRA brain > no acute changes, possibly small aneurysm ACOM 8/13 CT head> multiple areas of ICH  Micro Data:  8/2 blood > NG 8/2 SARS COV 2 > positive 8/4 resp > negative 8/4 urine >  8/12 blood > E. faecalis (pan-sensitive) 8/12 resp: staph aureus> MSSA 8/25 blood>> NG 8/24 BAL> yeast  Antimicrobials:  See fever tab for past abx Current Eraxis 8/26>> Zosyn 8/23>>  Interim history/subjective:  Still having oozing from trach site.  Otherwise awake, weak, following commands.   Objective   Blood pressure (!) 117/48, pulse 100, temperature 98.2 F (36.8 C), temperature source Axillary, resp. rate 20, height _0  (1.626 m), weight 130.6 kg, SpO2 (!) 83 %. CVP:  [7 mmHg-21 mmHg] 20 mmHg  Vent Mode: PCV FiO2 (%):  [40 %] 40 % Set Rate:  [15 bmp] 15 bmp PEEP:  [8 cmH20] 8 cmH20 Plateau Pressure:  [15 cmH20-29 cmH20] 15 cmH20   Intake/Output Summary (Last 24 hours) at 02/08/2020 0654 Last data filed at 02/08/2020 0600 Gross per 24 hour  Intake 4672.47 ml  Output 4087 ml  Net 585.47 ml   Filed Weights   02/06/20 0413 02/07/20 0411 02/08/20 0530  Weight: 121.6 kg 121 kg 130.6 kg     Examination:  GEN: critically ill woman lying in bed HEENT: trach site oozing blood CV: RRR, ext warm PULM: minimal breath sounds GI: soft, hypoactive BS EXT: improving edema NEURO: moves ext to command intermittently, eyes open, diffusely weak, nods appropriately to questions PSYCH: RASS 0 SKIN: no rashes  Plts stable in 60s Sugars labile LDH 1016>>756>>491 Hgb stable after 1 unit yesterday ABG pO2 71 Vent plat 13, PC 12/12 rate 15, double triggering Lactic 1.6>>1.3>>1.8>>1.7>>1.8 Fibrinogen 756>>606 INR 1.1 APTT 37  Resolved Hospital Problem list     Assessment & Plan:    Critically ill due to Acute hypoxic and AoC hypercapneic respiratory failure; likely underly OHS requiring VV ECMO support and mechanical ventilation.  ARDS due to COVID 19 pneumonia   MSSA pneumonia Fungal pneumonia- Candida dublinensis Tracheostomy in place; previously had bleeding -Continue vent support at current settings -Continue VV ECMO support -VAP prevention protocol -Sedation is required to tolerate mechanical ventilation -Routine trach care -Antimicrobials per ID  Enterococcal bacteremia-recent blood cultures cleared Concern for possible embolic phenomenon- fingers and possibly CVAs were embolic  Septic shock -Antibiotics per ID recommendations.  Assuming endocarditis. -Reculture if escalating vasopressor requirements  Elevated LDH, hypo-fibrinogenemia and thrombocytopenia -Improved post circuit change -Continue to monitor  ICH, multifocal. L-sided weakness.  -Continue PT, OT  Acute metabolic encephalopathy, improved -Sedation minimized as much as possible -Continue RRT -Delirium precautions  Acute renal failure, severe uremia.  Anuric. -Continue CRRT -Renally dose medications  Shock related to  RV strain - Con't milrinone   Hyperglycemia > not controlled at all with [place] insulin despite aggressive upward titration.  Assume poor absorption, potentially due to  subcutaneous edema.   -Remains elevated on IV.  Critical illness myopathy -PT, OT -Continue supportive care    Best practice:  Diet: tube feeding Pain/Anxiety/Delirium protocol (if indicated): as above VAP protocol (if indicated): yes DVT prophylaxis: SCDs , heparin on hold for trach oozing GI prophylaxis: pantoprazole Glucose control:  insulin infusion  Mobility: bed rest Code Status: limited Family Communication: We will update family later today Disposition: ICU    This patient is critically ill with multiple organ system failure which requires frequent high complexity decision making, assessment, support, evaluation, and titration of therapies. This was completed through the application of advanced monitoring technologies and extensive interpretation of multiple databases. During this encounter critical care time was devoted to patient care services described in this note for 41 minutes.   Julian Hy, DO 02/08/20 8:59 AM [facility]

## 2020-02-08 NOTE — Progress Notes (Addendum)
[facility] ROUNDING NOTE   Subjective:   This is a 54 year old lady with history of diabetes obesity presented with acute hypoxic respiratory failure due to COVID-19 infection.  Hospitalization has been complicated by severe shock refractory hypoxia requiring ECMO and multiple infections, intracranial hemorrhage and encephalopathy.  Baseline creatinine appears to be about 0.7 mg/dL.  She developed acute shock secondary to acute tubular necrosis and started on CRRT 01/28/2020.  Status post percutaneous tracheostomy with bronchoscopic guidance 02/05/2020  Blood pressure 116/50 pulse 100 temperature 98.2 O2 sats 83% FiO2 40%.  Being kept even on CRRT with slight negative balance  IV pressors:  vasopressin, norepinephrine  IV amiodarone   Sodium 139 potassium 4.2 chloride 104 CO2 24 BUN 34 creatinine 0.8 glucose 135 phosphorus 2.4 calcium 8 hemoglobin 9.9   Protonix 40 mg daily, oxycodone 10 mg every 6 hours, Tradjenta 5 mg daily  Eraxis 200 mg daily, IV Zosyn 4.5 g every 6 hours  Objective:  Vital signs in last 24 hours:  Temp:  [94.4 F (34.7 C)-98.2 F (36.8 C)] 98.2 F (36.8 C) (09/02 0530) Pulse Rate:  [67-100] 100 (09/02 0630) Resp:  [17-38] 20 (09/02 0400) BP: (89-172)/(42-69) 117/48 (09/02 0255) SpO2:  [77 %-96 %] 83 % (09/02 0630) Arterial Line BP: (93-161)/(43-57) 116/50 (09/02 0630) FiO2 (%):  [40 %] 40 % (09/02 0400) Weight:  [130.6 kg] 130.6 kg (09/02 0530)  Weight change: 9.6 kg Filed Weights   02/06/20 0413 02/07/20 0411 02/08/20 0530  Weight: 121.6 kg 121 kg 130.6 kg    Intake/Output: I/O last 3 completed shifts: In: 7076.2 [I.V.:2371.5; Blood:246; NG/GT:3130; IV Piggyback:1328.8] Out: 9563 [Other:3804; Stool:650]   Intake/Output this shift:  Total I/O In: 2394.5 [I.V.:837.5; Blood:315; NG/GT:960; IV Piggyback:282] Out: 2973 [Other:2385; Stool:550; Blood:38]  General: Critically ill female, has ECMO running.  Not much  improvement. Heart:RRR, s1s2 nl Lungs: Coarse breath sound bilateral Abdomen:soft, non-distended Extremities: Anasarca and edema improving. Dialysis Access: Temporary HD catheter   Basic Metabolic Panel: Recent Labs  Lab 02/04/20 0404 02/04/20 0836 02/05/20 0304 02/05/20 0403 02/06/20 0306 02/06/20 8756 02/06/20 1700 02/06/20 2022 02/07/20 0305 02/07/20 0326 02/07/20 1800 02/07/20 2025 02/07/20 2357 02/08/20 0414 02/08/20 0513  NA 137   < > 137   < > 135   < > 137   < > 137   < > 136 139 139 139 139  K 4.4   < > 4.3   < > 3.7   < > 4.1   < > 3.7   < > 4.3 3.9 4.0 4.0 4.2  CL 102   < > 102   < > 101  --  101  --  101  --  103  --   --  104  --   CO2 24   < > 23   < > 24  --  25  --  22  --  23  --   --  24  --   GLUCOSE 198*   < > 221*   < > 187*  --  128*  --  180*  --  200*  --   --  135*  --   BUN 46*   < > 44*   < > 39*  --  35*  --  32*  --  31*  --   --  34*  --   CREATININE 1.10*   < > 1.08*   < > 0.91  --  0.79  --  0.79  --  0.82  --   --  0.80  --   CALCIUM 7.9*   < > 8.1*   < > 8.0*   < > 8.2*   < > 8.0*  --  8.2*  --   --  8.0*  --   MG 2.8*  --  2.8*  --  2.5*  --   --   --  2.5*  --   --   --   --  2.4  --   PHOS 3.2  --  2.9  --  3.1  --   --   --  3.0  --   --   --   --  3.1  --    < > = values in this interval not displayed.    Liver Function Tests: Recent Labs  Lab 02/02/20 0330 02/03/20 0313 02/04/20 0404 02/05/20 0304  AST 195* 162* 105* 73*  ALT 134* 165* 158* 151*  ALKPHOS 259* 252* 243* 264*  BILITOT 1.9* 1.6* 1.8* 1.6*  PROT 5.4* 5.7* 5.7* 5.7*  ALBUMIN 2.4* 2.3* 2.2* 2.1*   No results for input(s): LIPASE, AMYLASE in the last 168 hours. No results for input(s): AMMONIA in the last 168 hours.  CBC: Recent Labs  Lab 02/06/20 1520 02/06/20 1522 02/07/20 0311 02/07/20 0326 02/07/20 1800 02/07/20 1800 02/07/20 2025 02/07/20 2357 02/08/20 0008 02/08/20 0414 02/08/20 0513  WBC 10.4  --  13.6*  --  13.5*  --   --   --  14.3* 14.2*   --   NEUTROABS  --   --  10.3*  --   --   --   --   --   --   --   --   HGB 8.3*   < > 8.7*   < > 8.1*   < > 8.8* 8.8* 7.7* 8.7* 9.9*  HCT 26.7*   < > 27.7*   < > 25.7*   < > 26.0* 26.0* 24.5* 28.2* 29.0*  MCV 104.7*  --  102.2*  --  104.5*  --   --   --  106.1* 102.9*  --   PLT 56*  --  69*  --  81*  --   --   --  93* 99*  --    < > = values in this interval not displayed.    Cardiac Enzymes: No results for input(s): CKTOTAL, CKMB, CKMBINDEX, TROPONINI in the last 168 hours.  BNP: Invalid input(s): POCBNP  CBG: Recent Labs  Lab 02/08/20 0159 02/08/20 0259 02/08/20 0423 02/08/20 0511 02/08/20 0616  GLUCAP 234* 189* 140* 141* 192*    Microbiology: Results for orders placed or performed during the hospital encounter of 01/16/2020  Blood Culture (routine x 2)     Status: None   Collection Time: 02/05/2020 10:20 AM   Specimen: BLOOD  Result Value Ref Range Status   Specimen Description   Final    BLOOD RIGHT ANTECUBITAL Performed at [facility], [address] [place], [place] [address]    Special Requests   Final    BOTTLES DRAWN AEROBIC AND ANAEROBIC Blood Culture adequate volume Performed at [facility], [address] [place], [place] [address]    Culture   Final    NO GROWTH 5 DAYS Performed at [facility], [place] [address], [place], [place] [address]    Report Status 01/13/2020 FINAL  Final  Blood Culture (routine x 2)  Status: None   Collection Time: 02/02/2020 11:19 AM   Specimen: BLOOD  Result Value Ref Range Status   Specimen Description   Final    BLOOD BLOOD RIGHT FOREARM Performed at [facility], [address] [place], [place] [address]    Special Requests   Final    BOTTLES DRAWN AEROBIC ONLY Blood Culture results may not be optimal due to an inadequate volume of blood received in culture bottles Performed at [facility], [address] [place], [place] [address]    Culture   Final    NO GROWTH  5 DAYS Performed at [facility], [place] [address], [place], [place] [address]    Report Status 01/13/2020 FINAL  Final  SARS Coronavirus 2 by RT PCR (hospital order, performed in [facility] hospital lab) Nasopharyngeal Nasopharyngeal Swab     Status: Abnormal   Collection Time: 01/14/2020 11:19 AM   Specimen: Nasopharyngeal Swab  Result Value Ref Range Status   SARS Coronavirus 2 POSITIVE (A) NEGATIVE Final    Comment: RESULT CALLED TO, READ BACK BY AND VERIFIED WITH:  SIMMS MARVA, RN @ 6301 ON 01/11/2020, CABELLERO.P (NOTE) SARS-CoV-2 target nucleic acids are DETECTED  SARS-CoV-2 RNA is generally detectable in upper respiratory specimens  during the acute phase of infection.  Positive results are indicative  of the presence of the identified virus, but do not rule out bacterial infection or co-infection with other pathogens not detected by the test.  Clinical correlation with patient history and  other diagnostic information is necessary to determine patient infection status.  The expected result is negative.  Fact Sheet for Patients:   StrictlyIdeas.no   Fact Sheet for Healthcare Providers:   BankingDealers.co.za    This test is not yet approved or cleared by the [place] FDA and  has been authorized for detection and/or diagnosis of SARS-CoV-2 by FDA under an Emergency Use Authorization (EUA).  This EUA will remain in effect  (meaning this test can be used) for the duration of  the COVID-19 declaration under Section 564(b)(1) of the Act, 21 U.S.C. section 360-bbb-3(b)(1), unless the authorization is terminated or revoked sooner.  Performed at [facility], [address] [place], [place] [address]   Culture, respiratory (non-expectorated)     Status: None   Collection Time: 01/10/20  8:32 AM   Specimen: Tracheal Aspirate; Respiratory  Result Value Ref Range Status   Specimen Description   Final     TRACHEAL ASPIRATE Performed at [place] [address] [place], [place] [address]    Special Requests   Final    NONE Performed at [facility], [place] [address], [place], [place] [address]    Gram Stain NO WBC SEEN NO ORGANISMS SEEN   Final   Culture   Final    FEW Consistent with normal respiratory flora. No Pseudomonas species isolated Performed at [place] [facility] [address] [place], [place] [address]    Report Status 01/12/2020 FINAL  Final  MRSA PCR Screening     Status: None   Collection Time: 01/10/20  6:11 PM   Specimen: Nasal Mucosa; Nasopharyngeal  Result Value Ref Range Status   MRSA by PCR NEGATIVE NEGATIVE Final    Comment:        The GeneXpert MRSA Assay (FDA approved for NASAL specimens only), is one component of a comprehensive MRSA colonization surveillance program. It is not intended  to diagnose MRSA infection nor to guide or monitor treatment for MRSA infections. Performed at [facility], [place] [address] [place], [place] [address]   Culture, Urine     Status: None   Collection Time: 01/10/20  6:11 PM   Specimen: Urine, Random  Result Value Ref Range Status   Specimen Description   Final    URINE, RANDOM Performed at [place] [address], [place], [place] [address]    Special Requests   Final    NONE Performed at [facility], [place] [address] [place], [place] [address]    Culture   Final    NO GROWTH Performed at [facility], [place] [address] [place], [place] [address]    Report Status 01/11/2020 FINAL  Final  Culture, respiratory (non-expectorated)     Status: None   Collection Time: 01/18/20  8:44 AM   Specimen: Tracheal Aspirate; Respiratory  Result Value Ref Range Status   Specimen Description TRACHEAL ASPIRATE  Final   Special Requests NONE  Final   Gram Stain   Final    NO WBC SEEN FEW GRAM POSITIVE COCCI IN PAIRS  IN CLUSTERS Performed at [facility], [address] [place], [place] [address]    Culture ABUNDANT STAPHYLOCOCCUS AUREUS  Final   Report Status 01/20/2020 FINAL  Final   Organism ID, Bacteria STAPHYLOCOCCUS AUREUS  Final      Susceptibility   Staphylococcus aureus - MIC*    CIPROFLOXACIN <=0.5 SENSITIVE Sensitive     ERYTHROMYCIN >=8 RESISTANT Resistant     GENTAMICIN <=0.5 SENSITIVE Sensitive     OXACILLIN <=0.25 SENSITIVE Sensitive     TETRACYCLINE <=1 SENSITIVE Sensitive     VANCOMYCIN <=0.5 SENSITIVE Sensitive     TRIMETH/SULFA <=10 SENSITIVE Sensitive     CLINDAMYCIN RESISTANT Resistant     RIFAMPIN <=0.5 SENSITIVE Sensitive     Inducible Clindamycin POSITIVE Resistant     * ABUNDANT STAPHYLOCOCCUS AUREUS  Culture, blood (Routine X 2) w Reflex to ID Panel     Status: Abnormal   Collection Time: 01/18/20 10:17 AM   Specimen: BLOOD  Result Value Ref Range Status   Specimen Description BLOOD LEFT ANTECUBITAL  Final   Special Requests   Final    BOTTLES DRAWN AEROBIC ONLY Blood Culture adequate volume   Culture  Setup Time   Final    AEROBIC BOTTLE ONLY GRAM POSITIVE COCCI Organism ID to follow CRITICAL RESULT CALLED TO, READ BACK BY AND VERIFIED WITH: [facility] 01/19/20 0044 JDW Performed at [facility], [address], [place], [place] [address]    Culture ENTEROCOCCUS FAECALIS (A)  Final   Report Status 01/20/2020 FINAL  Final   Organism ID, Bacteria ENTEROCOCCUS FAECALIS  Final      Susceptibility   Enterococcus faecalis - MIC*    AMPICILLIN <=2 SENSITIVE Sensitive     VANCOMYCIN 1 SENSITIVE Sensitive     GENTAMICIN SYNERGY SENSITIVE Sensitive     * ENTEROCOCCUS FAECALIS  Blood Culture ID Panel (Reflexed)     Status: Abnormal   Collection Time: 01/18/20 10:17 AM  Result Value Ref Range Status   Enterococcus faecalis DETECTED (A) NOT DETECTED Final    Comment: CRITICAL RESULT CALLED TO, READ BACK BY AND VERIFIED WITH: G ABBOTT PHARMD 01/19/20 0044  JDW    Enterococcus Faecium NOT DETECTED NOT DETECTED Final   Listeria monocytogenes NOT DETECTED NOT DETECTED Final   Staphylococcus  species NOT DETECTED NOT DETECTED Final   Staphylococcus aureus (BCID) NOT DETECTED NOT DETECTED Final   Staphylococcus epidermidis NOT DETECTED NOT DETECTED Final   Staphylococcus lugdunensis NOT DETECTED NOT DETECTED Final   Streptococcus species NOT DETECTED NOT DETECTED Final   Streptococcus agalactiae NOT DETECTED NOT DETECTED Final   Streptococcus pneumoniae NOT DETECTED NOT DETECTED Final   Streptococcus pyogenes NOT DETECTED NOT DETECTED Final   A.calcoaceticus-baumannii NOT DETECTED NOT DETECTED Final   Bacteroides fragilis NOT DETECTED NOT DETECTED Final   Enterobacterales NOT DETECTED NOT DETECTED Final   Enterobacter cloacae complex NOT DETECTED NOT DETECTED Final   Escherichia coli NOT DETECTED NOT DETECTED Final   Klebsiella aerogenes NOT DETECTED NOT DETECTED Final   Klebsiella oxytoca NOT DETECTED NOT DETECTED Final   Klebsiella pneumoniae NOT DETECTED NOT DETECTED Final   Proteus species NOT DETECTED NOT DETECTED Final   Salmonella species NOT DETECTED NOT DETECTED Final   Serratia marcescens NOT DETECTED NOT DETECTED Final   Haemophilus influenzae NOT DETECTED NOT DETECTED Final   Neisseria meningitidis NOT DETECTED NOT DETECTED Final   Pseudomonas aeruginosa NOT DETECTED NOT DETECTED Final   Stenotrophomonas maltophilia NOT DETECTED NOT DETECTED Final   Candida albicans NOT DETECTED NOT DETECTED Final   Candida auris NOT DETECTED NOT DETECTED Final   Candida glabrata NOT DETECTED NOT DETECTED Final   Candida krusei NOT DETECTED NOT DETECTED Final   Candida parapsilosis NOT DETECTED NOT DETECTED Final   Candida tropicalis NOT DETECTED NOT DETECTED Final   Cryptococcus neoformans/gattii NOT DETECTED NOT DETECTED Final   Vancomycin resistance NOT DETECTED NOT DETECTED Final    Comment: Performed at [facility], [address], [place], [place] [address]  Culture, blood (Routine X 2) w Reflex to ID Panel     Status: Abnormal   Collection Time: 01/18/20 10:30 AM   Specimen: BLOOD LEFT ARM  Result Value Ref Range Status   Specimen Description BLOOD LEFT ARM  Final   Special Requests   Final    BOTTLES DRAWN AEROBIC ONLY Blood Culture adequate volume   Culture  Setup Time   Final    AEROBIC BOTTLE ONLY GRAM POSITIVE COCCI CRITICAL VALUE NOTED.  VALUE IS CONSISTENT WITH PREVIOUSLY REPORTED AND CALLED VALUE.    Culture (A)  Final    ENTEROCOCCUS FAECALIS SUSCEPTIBILITIES PERFORMED ON PREVIOUS CULTURE WITHIN THE LAST 5 DAYS. Performed at [facility], [place] [address] [place], [place] [address]    Report Status 01/20/2020 FINAL  Final  Culture, blood (routine x 2)     Status: None   Collection Time: 01/21/20  6:07 PM   Specimen: BLOOD LEFT HAND  Result Value Ref Range Status   Specimen Description BLOOD LEFT HAND  Final   Special Requests   Final    BOTTLES DRAWN AEROBIC AND ANAEROBIC Blood Culture adequate volume   Culture   Final    NO GROWTH 5 DAYS Performed at [facility], [place] [address] [place], [place] [address]    Report Status 01/26/2020 FINAL  Final  Culture, blood (routine x 2)     Status: None   Collection Time: 01/21/20  6:14 PM   Specimen: BLOOD LEFT FOREARM  Result Value Ref Range Status   Specimen Description BLOOD LEFT FOREARM  Final   Special Requests   Final    BOTTLES DRAWN AEROBIC ONLY Blood Culture adequate volume   Culture   Final    NO GROWTH 5 DAYS Performed  at [facility], [place] [address], [place], [place] [address]    Report Status 01/26/2020 FINAL  Final  Culture, blood (Routine X 2) w Reflex to ID Panel     Status: None   Collection Time: 01/25/20  1:15 PM   Specimen: BLOOD  Result Value Ref Range Status   Specimen Description BLOOD LEFT ANTECUBITAL  Final   Special Requests   Final    BOTTLES DRAWN AEROBIC AND ANAEROBIC Blood Culture adequate volume    Culture   Final    NO GROWTH 5 DAYS Performed at [facility], [place] [address] [place], [place] [address]    Report Status 01/30/2020 FINAL  Final  Culture, blood (Routine X 2) w Reflex to ID Panel     Status: None   Collection Time: 01/25/20  1:23 PM   Specimen: BLOOD LEFT FOREARM  Result Value Ref Range Status   Specimen Description BLOOD LEFT FOREARM  Final   Special Requests   Final    BOTTLES DRAWN AEROBIC AND ANAEROBIC Blood Culture adequate volume   Culture   Final    NO GROWTH 5 DAYS Performed at [facility], [place] [address], [place], [place] [address]    Report Status 01/30/2020 FINAL  Final  SARS Coronavirus 2 by RT PCR (hospital order, performed in [facility] hospital lab) Nasopharyngeal     Status: None   Collection Time: 01/29/20  9:00 AM   Specimen: Nasopharyngeal  Result Value Ref Range Status   SARS Coronavirus 2 NEGATIVE NEGATIVE Final    Comment: (NOTE) SARS-CoV-2 target nucleic acids are NOT DETECTED.  The SARS-CoV-2 RNA is generally detectable in upper and lower respiratory specimens during the acute phase of infection. The lowest concentration of SARS-CoV-2 viral copies this assay can detect is 250 copies / mL. A negative result does not preclude SARS-CoV-2 infection and should not be used as the sole basis for treatment or other patient management decisions.  A negative result may occur with improper specimen collection / handling, submission of specimen other than nasopharyngeal swab, presence of viral mutation(s) within the areas targeted by this assay, and inadequate number of viral copies (<250 copies / mL). A negative result must be combined with clinical observations, patient history, and epidemiological information.  Fact Sheet for Patients:   StrictlyIdeas.no  Fact Sheet for Healthcare Providers: BankingDealers.co.za  This test is not yet approved or  cleared by the [place] FDA  and has been authorized for detection and/or diagnosis of SARS-CoV-2 by FDA under an Emergency Use Authorization (EUA).  This EUA will remain in effect (meaning this test can be used) for the duration of the COVID-19 declaration under Section 564(b)(1) of the Act, 21 U.S.C. section 360bbb-3(b)(1), unless the authorization is terminated or revoked sooner.  Performed at [facility], [place] [address], [place], [place] [address]   Culture, bal-quantitative     Status: Abnormal   Collection Time: 01/30/20 10:36 AM   Specimen: Bronchial Alveolar Lavage; Respiratory  Result Value Ref Range Status   Specimen Description Bronch [place]  Final   Special Requests NONE  Final   Gram Stain   Final    ABUNDANT WBC PRESENT,BOTH PMN AND MONONUCLEAR NO ORGANISMS SEEN Performed at [facility], [address], [place], [place] [address]    Culture 10,000 COLONIES/mL CANDIDA DUBLINIENSIS (A)  Final   Report Status 02/02/2020 FINAL  Final  Culture, blood (routine x 2)  Status: None   Collection Time: 01/31/20  5:36 PM   Specimen: BLOOD RIGHT HAND  Result Value Ref Range Status   Specimen Description BLOOD RIGHT HAND  Final   Special Requests   Final    BOTTLES DRAWN AEROBIC ONLY Blood Culture results may not be optimal due to an inadequate volume of blood received in culture bottles   Culture   Final    NO GROWTH 5 DAYS Performed at [facility], [place] [address], [place], [place] [address]    Report Status 02/05/2020 FINAL  Final  Culture, blood (routine x 2)     Status: None   Collection Time: 01/31/20  5:50 PM   Specimen: BLOOD RIGHT HAND  Result Value Ref Range Status   Specimen Description BLOOD RIGHT HAND  Final   Special Requests   Final    BOTTLES DRAWN AEROBIC ONLY Blood Culture results may not be optimal due to an inadequate volume of blood received in culture bottles   Culture   Final    NO GROWTH 5 DAYS Performed at [facility], [place] [address]  [place], [place] [address]    Report Status 02/05/2020 FINAL  Final    Coagulation Studies: Recent Labs    02/05/20 2242  LABPROT 13.8  INR 1.1    Urinalysis: No results for input(s): COLORURINE, LABSPEC, PHURINE, GLUCOSEU, HGBUR, BILIRUBINUR, KETONESUR, PROTEINUR, UROBILINOGEN, NITRITE, LEUKOCYTESUR in the last 72 hours.  Invalid input(s): APPERANCEUR    Imaging: DG CHEST PORT 1 VIEW  Result Date: 02/07/2020 CLINICAL DATA:  COVID pneumonia with respiratory failure.  ECMO. EXAM: PORTABLE CHEST 1 VIEW COMPARISON:  Multiple recent previous exams. FINDINGS: The diffuse confluent airspace opacity in both lungs is again noted with some slight interval improvement at the left upper lung. Tracheostomy tube remains in place. A feeding tube passes into the stomach although the distal tip position is not included on the film. Left IJ central line tip overlies the innominate vein confluence. Left subclavian central line tip overlies the central left subclavian vein. Right PICC line tip overlies the mid SVC level. Telemetry leads overlie the chest. ECMO cannula again noted. IMPRESSION: Slight interval improvement in the left upper lung. Otherwise no substantial change in exam. Electronically Signed   By: Misty Stanley M.D.   On: 02/07/2020 08:54     Medications:   .  prismasol BGK 4/2.5 300 mL/hr at 02/06/20 2354  . sodium chloride 10 mL/hr at 02/08/20 0600  . sodium chloride 10 mL/hr at 02/08/20 0600  . albumin human 12.5 g (02/07/20 1424)  . albumin human Stopped (02/06/20 2000)  . amiodarone 30 mg/hr (02/08/20 0600)  . anidulafungin Stopped (02/07/20 1845)  . desmopressin (DDAVP) IV for Bleeding    . dexmedetomidine (PRECEDEX) IV infusion Stopped (02/08/20 0513)  . feeding supplement (PIVOT 1.5 CAL) 1,000 mL (02/07/20 2330)  . heparin Stopped (02/07/20 0659)  . HYDROmorphone 0.5 mg/hr (02/08/20 0600)  . insulin 4.6 mL/hr at 02/08/20 0600  . milrinone Stopped (02/05/20 0905)  .  norepinephrine (LEVOPHED) Adult infusion 15 mcg/min (02/08/20 0600)  . piperacillin-tazobactam (ZOSYN)  IV Stopped (02/08/20 0553)  . prismasol BGK 2/2.5 replacement solution 500 mL/hr at 02/07/20 2044  . prismasol BGK 4/2.5 2,000 mL/hr at 02/08/20 0609  . vasopressin Stopped (02/06/20 1741)   . sodium chloride   Intravenous Once  . sodium chloride   Intravenous Once  . vitamin C  500 mg Per Tube Daily  . B-complex with vitamin C  1 tablet Oral Daily  . chlorhexidine gluconate (MEDLINE KIT)  15 mL Mouth Rinse BID  . Chlorhexidine Gluconate Cloth  6 each Topical Daily  . clonazePAM  1 mg Per Tube Q6H  . docusate  100 mg Per Tube BID  . feeding supplement (PROSource TF)  45 mL Per Tube TID  . fentaNYL (SUBLIMAZE) injection  50 mcg Intravenous Once  . linagliptin  5 mg Per Tube Daily  . mouth rinse  15 mL Mouth Rinse 10 times per day  . oxyCODONE  10 mg Per Tube Q6H  . pantoprazole (PROTONIX) IV  40 mg Intravenous Daily  . polyethylene glycol  17 g Per Tube Daily  . sennosides  5 mL Per Tube BID  . zinc sulfate  220 mg Per Tube Daily   sodium chloride, sodium chloride, acetaminophen (TYLENOL) oral liquid 160 mg/5 mL, albumin human, albuterol, dextrose, fentaNYL, fentaNYL (SUBLIMAZE) injection, guaiFENesin-dextromethorphan, heparin, hydrALAZINE, HYDROmorphone, hydrOXYzine, ondansetron **OR** ondansetron (ZOFRAN) IV, sodium chloride  Assessment/ Plan:  1.  Anuric AKI due to shock, sepsis causing acute tubular necrosis: Started CRRT on 8/22 for uremia and volume overload. She continues on ECMO. Continue CRRT, prognosis grim.  2.Shock due to sepsis, Covid infection, Enterococcus bacteremia: On pressors, antibiotics per primary team.  3.Acute hypoxic and hypercapnic respiratory failure, ARDS due to COVID-19 pneumonia: Currently on ECMO for refractory hypoxemia, MSSA pneumonia.  On broad-spectrum antibiotics.  IV Zosyn and IV Eraxis appreciate assistance from infectious disease.  Status  post tracheostomy 04/06/2020  3.Hypernatremia, hypervolemia: relative free water deficit.  Improved with CRRT.  4.Acute toxic metabolic encephalopathy:Multifactorial with uremia contributing. Treatment with CRRT as above.  5.Anemia, thrombocytopenia: With hemolysis likely and acute illness contributing. Transfusions per primary team.  Seen by hematologist.  6.Intracranial hemorrhage: With some left-sided weakness preceded by primary team.   7. GOC: Multiorgan failure.   family meeting on 8/25, continue full scope of treatment.  Long-term prognosis poor.  8.  Diabetes as per primary service continues on oral Tradjenta.    LOS: [place] _0 _1 :50 AM

## 2020-02-09 ENCOUNTER — Inpatient Hospital Stay (HOSPITAL_COMMUNITY): Payer: PRIVATE HEALTH INSURANCE

## 2020-02-09 DIAGNOSIS — J9602 Acute respiratory failure with hypercapnia: Secondary | ICD-10-CM

## 2020-02-09 DIAGNOSIS — J9601 Acute respiratory failure with hypoxia: Secondary | ICD-10-CM

## 2020-02-09 DIAGNOSIS — G7281 Critical illness myopathy: Secondary | ICD-10-CM

## 2020-02-09 LAB — BASIC METABOLIC PANEL
Anion gap: 11 (ref 5–15)
Anion gap: 12 (ref 5–15)
BUN: 33 mg/dL — ABNORMAL HIGH (ref 6–20)
BUN: 30 mg/dL — ABNORMAL HIGH (ref 6–20)
CO2: 24 mmol/L (ref 22–32)
CO2: 22 mmol/L (ref 22–32)
Calcium: 8.1 mg/dL — ABNORMAL LOW (ref 8.9–10.3)
Calcium: 8.4 mg/dL — ABNORMAL LOW (ref 8.9–10.3)
Chloride: 102 mmol/L (ref 98–111)
Chloride: 102 mmol/L (ref 98–111)
Creatinine, Ser: 0.94 mg/dL (ref 0.44–1.00)
Creatinine, Ser: 1.02 mg/dL — ABNORMAL HIGH (ref 0.44–1.00)
GFR calc Af Amer: >60 mL/min (ref >60)
GFR calc Af Amer: >60 mL/min (ref >60)
GFR calc non Af Amer: >60 mL/min (ref >60)
GFR calc non Af Amer: >60 mL/min (ref >60)
Glucose, Bld: 215 mg/dL — ABNORMAL HIGH (ref 70–99)
Glucose, Bld: 162 mg/dL — ABNORMAL HIGH (ref 70–99)
Potassium: 4.1 mmol/L (ref 3.5–5.1)
Potassium: 4.3 mmol/L (ref 3.5–5.1)
Sodium: 137 mmol/L (ref 135–145)
Sodium: 136 mmol/L (ref 135–145)

## 2020-02-09 LAB — GLUCOSE, CAPILLARY
Glucose-Capillary: 121 mg/dL — ABNORMAL HIGH (ref 70–99)
Glucose-Capillary: 122 mg/dL — ABNORMAL HIGH (ref 70–99)
Glucose-Capillary: 124 mg/dL — ABNORMAL HIGH (ref 70–99)
Glucose-Capillary: 131 mg/dL — ABNORMAL HIGH (ref 70–99)
Glucose-Capillary: 139 mg/dL — ABNORMAL HIGH (ref 70–99)
Glucose-Capillary: 149 mg/dL — ABNORMAL HIGH (ref 70–99)
Glucose-Capillary: 151 mg/dL — ABNORMAL HIGH (ref 70–99)
Glucose-Capillary: 155 mg/dL — ABNORMAL HIGH (ref 70–99)
Glucose-Capillary: 155 mg/dL — ABNORMAL HIGH (ref 70–99)
Glucose-Capillary: 155 mg/dL — ABNORMAL HIGH (ref 70–99)
Glucose-Capillary: 160 mg/dL — ABNORMAL HIGH (ref 70–99)
Glucose-Capillary: 160 mg/dL — ABNORMAL HIGH (ref 70–99)
Glucose-Capillary: 170 mg/dL — ABNORMAL HIGH (ref 70–99)
Glucose-Capillary: 176 mg/dL — ABNORMAL HIGH (ref 70–99)
Glucose-Capillary: 179 mg/dL — ABNORMAL HIGH (ref 70–99)
Glucose-Capillary: 180 mg/dL — ABNORMAL HIGH (ref 70–99)
Glucose-Capillary: 181 mg/dL — ABNORMAL HIGH (ref 70–99)
Glucose-Capillary: 199 mg/dL — ABNORMAL HIGH (ref 70–99)
Glucose-Capillary: 200 mg/dL — ABNORMAL HIGH (ref 70–99)
Glucose-Capillary: 204 mg/dL — ABNORMAL HIGH (ref 70–99)
Glucose-Capillary: 208 mg/dL — ABNORMAL HIGH (ref 70–99)
Glucose-Capillary: 218 mg/dL — ABNORMAL HIGH (ref 70–99)

## 2020-02-09 LAB — CBC
HCT: 28 % — ABNORMAL LOW (ref 36.0–46.0)
HCT: 28.5 % — ABNORMAL LOW (ref 36.0–46.0)
Hemoglobin: 8.7 g/dL — ABNORMAL LOW (ref 12.0–15.0)
Hemoglobin: 8.6 g/dL — ABNORMAL LOW (ref 12.0–15.0)
MCH: 32.8 pg (ref 26.0–34.0)
MCH: 32.3 pg (ref 26.0–34.0)
MCHC: 31.1 g/dL (ref 30.0–36.0)
MCHC: 30.2 g/dL (ref 30.0–36.0)
MCV: 105.7 fL — ABNORMAL HIGH (ref 80.0–100.0)
MCV: 107.1 fL — ABNORMAL HIGH (ref 80.0–100.0)
Platelets: 111 10*3/uL — ABNORMAL LOW (ref 150–400)
Platelets: 104 10*3/uL — ABNORMAL LOW (ref 150–400)
RBC: 2.65 MIL/uL — ABNORMAL LOW (ref 3.87–5.11)
RBC: 2.66 MIL/uL — ABNORMAL LOW (ref 3.87–5.11)
RDW: 28.5 % — ABNORMAL HIGH (ref 11.5–15.5)
RDW: 29.2 % — ABNORMAL HIGH (ref 11.5–15.5)
WBC: 16.4 10*3/uL — ABNORMAL HIGH (ref 4.0–10.5)
WBC: 17.5 10*3/uL — ABNORMAL HIGH (ref 4.0–10.5)
nRBC: 7.7 % — ABNORMAL HIGH (ref 0.0–0.2)
nRBC: 5.5 % — ABNORMAL HIGH (ref 0.0–0.2)

## 2020-02-09 LAB — POCT I-STAT 7, (LYTES, BLD GAS, ICA,H+H)
Acid-Base Excess: 1 mmol/L (ref 0.0–2.0)
Acid-Base Excess: 2 mmol/L (ref 0.0–2.0)
Acid-Base Excess: 2 mmol/L (ref 0.0–2.0)
Acid-Base Excess: 2 mmol/L (ref 0.0–2.0)
Acid-Base Excess: 3 mmol/L — ABNORMAL HIGH (ref 0.0–2.0)
Acid-base deficit: 1 mmol/L (ref 0.0–2.0)
Bicarbonate: 25.3 mmol/L (ref 20.0–28.0)
Bicarbonate: 25.8 mmol/L (ref 20.0–28.0)
Bicarbonate: 26.7 mmol/L (ref 20.0–28.0)
Bicarbonate: 26.9 mmol/L (ref 20.0–28.0)
Bicarbonate: 27.2 mmol/L (ref 20.0–28.0)
Bicarbonate: 28.4 mmol/L — ABNORMAL HIGH (ref 20.0–28.0)
Calcium, Ion: 1.1 mmol/L — ABNORMAL LOW (ref 1.15–1.40)
Calcium, Ion: 1.11 mmol/L — ABNORMAL LOW (ref 1.15–1.40)
Calcium, Ion: 1.13 mmol/L — ABNORMAL LOW (ref 1.15–1.40)
Calcium, Ion: 1.13 mmol/L — ABNORMAL LOW (ref 1.15–1.40)
Calcium, Ion: 1.14 mmol/L — ABNORMAL LOW (ref 1.15–1.40)
Calcium, Ion: 1.14 mmol/L — ABNORMAL LOW (ref 1.15–1.40)
HCT: 25 % — ABNORMAL LOW (ref 36.0–46.0)
HCT: 26 % — ABNORMAL LOW (ref 36.0–46.0)
HCT: 27 % — ABNORMAL LOW (ref 36.0–46.0)
HCT: 27 % — ABNORMAL LOW (ref 36.0–46.0)
HCT: 28 % — ABNORMAL LOW (ref 36.0–46.0)
HCT: 28 % — ABNORMAL LOW (ref 36.0–46.0)
Hemoglobin: 8.5 g/dL — ABNORMAL LOW (ref 12.0–15.0)
Hemoglobin: 8.8 g/dL — ABNORMAL LOW (ref 12.0–15.0)
Hemoglobin: 9.2 g/dL — ABNORMAL LOW (ref 12.0–15.0)
Hemoglobin: 9.2 g/dL — ABNORMAL LOW (ref 12.0–15.0)
Hemoglobin: 9.5 g/dL — ABNORMAL LOW (ref 12.0–15.0)
Hemoglobin: 9.5 g/dL — ABNORMAL LOW (ref 12.0–15.0)
O2 Saturation: 84 %
O2 Saturation: 86 %
O2 Saturation: 88 %
O2 Saturation: 89 %
O2 Saturation: 90 %
O2 Saturation: 95 %
Patient temperature: 97.7
Patient temperature: 97.7
Patient temperature: 97.9
Patient temperature: 98
Patient temperature: 98
Patient temperature: 98.3
Potassium: 3.9 mmol/L (ref 3.5–5.1)
Potassium: 4.2 mmol/L (ref 3.5–5.1)
Potassium: 4.4 mmol/L (ref 3.5–5.1)
Potassium: 4.6 mmol/L (ref 3.5–5.1)
Potassium: 4.7 mmol/L (ref 3.5–5.1)
Potassium: 4.9 mmol/L (ref 3.5–5.1)
Sodium: 138 mmol/L (ref 135–145)
Sodium: 138 mmol/L (ref 135–145)
Sodium: 139 mmol/L (ref 135–145)
Sodium: 139 mmol/L (ref 135–145)
Sodium: 139 mmol/L (ref 135–145)
Sodium: 140 mmol/L (ref 135–145)
TCO2: 27 mmol/L (ref 22–32)
TCO2: 27 mmol/L (ref 22–32)
TCO2: 28 mmol/L (ref 22–32)
TCO2: 28 mmol/L (ref 22–32)
TCO2: 29 mmol/L (ref 22–32)
TCO2: 30 mmol/L (ref 22–32)
pCO2 arterial: 39.1 mmHg (ref 32.0–48.0)
pCO2 arterial: 40.2 mmHg (ref 32.0–48.0)
pCO2 arterial: 42.6 mmHg (ref 32.0–48.0)
pCO2 arterial: 45.7 mmHg (ref 32.0–48.0)
pCO2 arterial: 48 mmHg (ref 32.0–48.0)
pCO2 arterial: 48.3 mmHg — ABNORMAL HIGH (ref 32.0–48.0)
pH, Arterial: 7.326 — ABNORMAL LOW (ref 7.350–7.450)
pH, Arterial: 7.378 (ref 7.350–7.450)
pH, Arterial: 7.382 (ref 7.350–7.450)
pH, Arterial: 7.389 (ref 7.350–7.450)
pH, Arterial: 7.433 (ref 7.350–7.450)
pH, Arterial: 7.442 (ref 7.350–7.450)
pO2, Arterial: 50 mmHg — ABNORMAL LOW (ref 83.0–108.0)
pO2, Arterial: 50 mmHg — ABNORMAL LOW (ref 83.0–108.0)
pO2, Arterial: 57 mmHg — ABNORMAL LOW (ref 83.0–108.0)
pO2, Arterial: 58 mmHg — ABNORMAL LOW (ref 83.0–108.0)
pO2, Arterial: 58 mmHg — ABNORMAL LOW (ref 83.0–108.0)
pO2, Arterial: 70 mmHg — ABNORMAL LOW (ref 83.0–108.0)

## 2020-02-09 LAB — LACTIC ACID, PLASMA
Lactic Acid, Venous: 1.6 mmol/L (ref 0.5–1.9)
Lactic Acid, Venous: 1.7 mmol/L (ref 0.5–1.9)

## 2020-02-09 LAB — MAGNESIUM
Magnesium: 2.8 mg/dL — ABNORMAL HIGH (ref 1.7–2.4)
Magnesium: 2.7 mg/dL — ABNORMAL HIGH (ref 1.7–2.4)

## 2020-02-09 LAB — FIBRINOGEN: Fibrinogen: 609 mg/dL — ABNORMAL HIGH (ref 210–475)

## 2020-02-09 LAB — HEPARIN LEVEL (UNFRACTIONATED): Heparin Unfractionated: <0.1 IU/mL — ABNORMAL LOW (ref 0.30–0.70)

## 2020-02-09 LAB — PHOSPHORUS
Phosphorus: 2.2 mg/dL — ABNORMAL LOW (ref 2.5–4.6)
Phosphorus: 4.4 mg/dL (ref 2.5–4.6)

## 2020-02-09 LAB — APTT
aPTT: 50 seconds — ABNORMAL HIGH (ref 24–36)
aPTT: 50 seconds — ABNORMAL HIGH (ref 24–36)

## 2020-02-09 LAB — LACTATE DEHYDROGENASE: LDH: 477 U/L — ABNORMAL HIGH (ref 98–192)

## 2020-02-09 MED ORDER — MIDODRINE HCL 5 MG PO TABS
5.0000 mg | ORAL_TABLET | Freq: Three times a day (TID) | ORAL | Status: DC
  Administered 2020-02-09 – 2020-02-10 (×5): 5 mg
  Filled 2020-02-09 (×6): qty 1

## 2020-02-09 MED ORDER — PANTOPRAZOLE SODIUM 40 MG IV SOLR
40.0000 mg | Freq: Two times a day (BID) | INTRAVENOUS | Status: DC
  Administered 2020-02-09 – 2020-03-04 (×48): 40 mg via INTRAVENOUS
  Filled 2020-02-09 (×48): qty 40

## 2020-02-09 MED ORDER — SODIUM PHOSPHATES 45 MMOLE/15ML IV SOLN
20.0000 mmol | Freq: Once | INTRAVENOUS | Status: AC
  Administered 2020-02-09: 20 mmol via INTRAVENOUS
  Filled 2020-02-09: qty 6.67

## 2020-02-09 MED ORDER — MELATONIN 3 MG PO TABS
9.0000 mg | ORAL_TABLET | Freq: Every day | ORAL | Status: DC
  Administered 2020-02-09 – 2020-03-03 (×23): 9 mg
  Filled 2020-02-09 (×23): qty 3

## 2020-02-09 MED ORDER — HYDROMORPHONE HCL 1 MG/ML IJ SOLN
1.0000 mg | INTRAMUSCULAR | Status: DC | PRN
  Administered 2020-02-09 – 2020-02-28 (×7): 1 mg via INTRAVENOUS
  Filled 2020-02-09 (×9): qty 1

## 2020-02-09 MED ORDER — QUETIAPINE FUMARATE 50 MG PO TABS
50.0000 mg | ORAL_TABLET | Freq: Every day | ORAL | Status: DC
  Administered 2020-02-09 – 2020-02-19 (×11): 50 mg
  Filled 2020-02-09 (×11): qty 1

## 2020-02-09 MED ORDER — METOCLOPRAMIDE HCL 5 MG/ML IJ SOLN
5.0000 mg | Freq: Two times a day (BID) | INTRAMUSCULAR | Status: DC
  Administered 2020-02-09 – 2020-02-10 (×4): 5 mg via INTRAVENOUS
  Filled 2020-02-09 (×4): qty 2

## 2020-02-09 MED ORDER — AMIODARONE HCL 200 MG PO TABS
200.0000 mg | ORAL_TABLET | Freq: Every day | ORAL | Status: DC
  Administered 2020-02-09 – 2020-03-04 (×25): 200 mg
  Filled 2020-02-09 (×25): qty 1

## 2020-02-09 NOTE — Progress Notes (Addendum)
[facility] ROUNDING NOTE   Subjective:   This is a 54 year old lady with history of diabetes obesity presented with acute hypoxic respiratory failure due to COVID-19 infection.  Hospitalization has been complicated by severe shock refractory hypoxia requiring ECMO and multiple infections, intracranial hemorrhage and encephalopathy.  Baseline creatinine appears to be about 0.7 mg/dL.  She developed acute shock secondary to acute tubular necrosis and started on CRRT 01/28/2020.  Status post percutaneous tracheostomy with bronchoscopic guidance 02/05/2020  Blood pressure 123/47 pulse 96 temperature 99.1 O2 sats 85% FiO2 40%.  Slight negative balance removing 50 cc an hour  IV pressors:  vasopressin, norepinephrine  IV amiodarone   Sodium 139 potassium 3.9 chloride 102 CO2 24 BUN 33 creatinine 0.94 glucose 215 calcium 8.1 phosphorus 2.2 magnesium 2.8 hemoglobin 9.5   Protonix 40 mg daily, oxycodone 10 mg every 6 hours, Tradjenta 5 mg daily  Eraxis 200 mg daily, IV Zosyn 4.5 g every 6 hours  Objective:  Vital signs in last 24 hours:  Temp:  [97.7 F (36.5 C)-99.1 F (37.3 C)] 99.1 F (37.3 C) (09/03 0545) Pulse Rate:  [77-103] 96 (09/03 0630) Resp:  [15-28] 23 (09/03 0400) BP: (105-145)/(50-54) 105/54 (09/02 2338) SpO2:  [78 %-100 %] 85 % (09/03 0630) Arterial Line BP: (86-165)/(44-62) 123/47 (09/03 0630) FiO2 (%):  [40 %] 40 % (09/03 0400) Weight:  [126 kg] 126 kg (09/03 0500)  Weight change: -4.6 kg Filed Weights   02/07/20 0411 02/08/20 0530 02/09/20 0500  Weight: 121 kg 130.6 kg 126 kg    Intake/Output: I/O last 3 completed shifts: In: 6964.8 [I.V.:2444.9; Blood:315; QQ/PY:1950; IV Piggyback:859.9] Out: 9326 [ZTIWP:8099; Stool:850; Blood:38]   Intake/Output this shift:  Total I/O In: 1868.2 [I.V.:672; NG/GT:1032.5; IV Piggyback:163.7] Out: 2727 [Other:2377; Stool:350]  General: Critically ill female, has ECMO running.  Not much improvement. Heart:RRR,  s1s2 nl Lungs: Coarse breath sound bilateral Abdomen:soft, non-distended Extremities: Anasarca and edema improving. Dialysis Access: Temporary HD catheter   Basic Metabolic Panel: Recent Labs  Lab 02/05/20 0304 02/05/20 0403 02/06/20 0306 02/06/20 8338 02/07/20 0305 02/07/20 0326 02/07/20 1800 02/07/20 2025 02/08/20 0414 02/08/20 2505 02/08/20 1628 02/08/20 2303 02/09/20 0019 02/09/20 0341 02/09/20 0352  NA 137   < > 135   < > 137   < > 136   < > 139   < > 138 140 138 137 139  K 4.3   < > 3.7   < > 3.7   < > 4.3   < > 4.0   < > 4.5 4.4 4.7 4.1 3.9  CL 102   < > 101   < > 101  --  103  --  104  --  102  --   --  102  --   CO2 23   < > 24   < > 22  --  23  --  24  --  25  --   --  24  --   GLUCOSE 221*   < > 187*   < > 180*  --  200*  --  135*  --  129*  --   --  215*  --   BUN 44*   < > 39*   < > 32*  --  31*  --  34*  --  33*  --   --  33*  --   CREATININE 1.08*   < > 0.91   < > 0.79  --  0.82  --  0.80  --  0.87  --   --  0.94  --   CALCIUM 8.1*   < > 8.0*   < > 8.0*   < > 8.2*   < > 8.0*  --  8.1*  --   --  8.1*  --   MG 2.8*  --  2.5*  --  2.5*  --   --   --  2.4  --   --   --   --  2.8*  --   PHOS 2.9  --  3.1  --  3.0  --   --   --  3.1  --   --   --   --  2.2*  --    < > = values in this interval not displayed.    Liver Function Tests: Recent Labs  Lab 02/03/20 0313 02/04/20 0404 02/05/20 0304  AST 162* 105* 73*  ALT 165* 158* 151*  ALKPHOS 252* 243* 264*  BILITOT 1.6* 1.8* 1.6*  PROT 5.7* 5.7* 5.7*  ALBUMIN 2.3* 2.2* 2.1*   No results for input(s): LIPASE, AMYLASE in the last 168 hours. No results for input(s): AMMONIA in the last 168 hours.  CBC: Recent Labs  Lab 02/07/20 0311 02/07/20 0326 02/07/20 1800 02/07/20 2025 02/08/20 0008 02/08/20 0008 02/08/20 0414 02/08/20 0513 02/08/20 1628 02/08/20 2303 02/09/20 0019 02/09/20 0341 02/09/20 0352  WBC 13.6*   < > 13.5*  --  14.3*  --  14.2*  --  17.7*  --   --  16.4*  --   NEUTROABS 10.3*  --    --   --   --   --   --   --   --   --   --   --   --   HGB 8.7*   < > 8.1*   < > 7.7*   < > 8.7*   < > 8.9* 9.9* 8.5* 8.7* 9.5*  HCT 27.7*   < > 25.7*   < > 24.5*   < > 28.2*   < > 28.6* 29.0* 25.0* 28.0* 28.0*  MCV 102.2*   < > 104.5*  --  106.1*  --  102.9*  --  105.5*  --   --  105.7*  --   PLT 69*   < > 81*  --  93*  --  99*  --  104*  --   --  111*  --    < > = values in this interval not displayed.    Cardiac Enzymes: No results for input(s): CKTOTAL, CKMB, CKMBINDEX, TROPONINI in the last 168 hours.  BNP: Invalid input(s): POCBNP  CBG: Recent Labs  Lab 02/09/20 0203 02/09/20 0306 02/09/20 0354 02/09/20 0454 02/09/20 0612  GLUCAP 204* 218* 208* 199* 200*    Microbiology: Results for orders placed or performed during the hospital encounter of 01/27/2020  Blood Culture (routine x 2)     Status: None   Collection Time: 01/28/2020 10:20 AM   Specimen: BLOOD  Result Value Ref Range Status   Specimen Description   Final    BLOOD RIGHT ANTECUBITAL Performed at [facility], [address] [place], [place] [address]    Special Requests   Final    BOTTLES DRAWN AEROBIC AND ANAEROBIC Blood Culture adequate volume Performed at [facility], [address] [place], [place] [address]    Culture   Final    NO GROWTH 5 DAYS Performed at [facility], [address] [place], [place] [address]    Report Status 01/13/2020 FINAL  Final  Blood Culture (routine x 2)     Status: None   Collection Time: 01/18/2020 11:19 AM   Specimen: BLOOD  Result Value Ref Range Status   Specimen Description   Final    BLOOD BLOOD RIGHT FOREARM Performed at [facility], [address] [place], [place] [address]    Special Requests   Final    BOTTLES DRAWN AEROBIC ONLY Blood Culture results may not be optimal due to an inadequate volume of blood received in culture bottles Performed at [facility], [address] [place], [place] [address]     Culture   Final    NO GROWTH 5 DAYS Performed at [facility], [place] [address], [place], [place] [address]    Report Status 01/13/2020 FINAL  Final  SARS Coronavirus 2 by RT PCR (hospital order, performed in [facility] hospital lab) Nasopharyngeal Nasopharyngeal Swab     Status: Abnormal   Collection Time: 02/04/2020 11:19 AM   Specimen: Nasopharyngeal Swab  Result Value Ref Range Status   SARS Coronavirus 2 POSITIVE (A) NEGATIVE Final    Comment: RESULT CALLED TO, READ BACK BY AND VERIFIED WITH:  SIMMS MARVA, RN @ 3299 ON 02/05/2020, CABELLERO.P (NOTE) SARS-CoV-2 target nucleic acids are DETECTED  SARS-CoV-2 RNA is generally detectable in upper respiratory specimens  during the acute phase of infection.  Positive results are indicative  of the presence of the identified virus, but do not rule out bacterial infection or co-infection with other pathogens not detected by the test.  Clinical correlation with patient history and  other diagnostic information is necessary to determine patient infection status.  The expected result is negative.  Fact Sheet for Patients:   StrictlyIdeas.no   Fact Sheet for Healthcare Providers:   BankingDealers.co.za    This test is not yet approved or cleared by the [place] FDA and  has been authorized for detection and/or diagnosis of SARS-CoV-2 by FDA under an Emergency Use Authorization (EUA).  This EUA will remain in effect  (meaning this test can be used) for the duration of  the COVID-19 declaration under Section 564(b)(1) of the Act, 21 U.S.C. section 360-bbb-3(b)(1), unless the authorization is terminated or revoked sooner.  Performed at [facility], [address] [place], [place] [address]   Culture, respiratory (non-expectorated)     Status: None   Collection Time: 01/10/20  8:32 AM   Specimen: Tracheal Aspirate; Respiratory  Result Value Ref Range Status   Specimen  Description   Final    TRACHEAL ASPIRATE Performed at [place] [address], [place], [place] [address]    Special Requests   Final    NONE Performed at [facility], [place] [address], [place], [place] [address]    Gram Stain NO WBC SEEN NO ORGANISMS SEEN   Final   Culture   Final    FEW Consistent with normal respiratory flora. No Pseudomonas species isolated Performed at [place] [address] [place], [place] [address]    Report Status 01/12/2020 FINAL  Final  MRSA PCR Screening     Status: None   Collection Time: 01/10/20  6:11 PM   Specimen: Nasal Mucosa; Nasopharyngeal  Result Value Ref Range Status   MRSA by PCR NEGATIVE NEGATIVE Final    Comment:  The GeneXpert MRSA Assay (FDA approved for NASAL specimens only), is one component of a comprehensive MRSA colonization surveillance program. It is not intended to diagnose MRSA infection nor to guide or monitor treatment for MRSA infections. Performed at [facility], [place] [address], [place], [place] [address]   Culture, Urine     Status: None   Collection Time: 01/10/20  6:11 PM   Specimen: Urine, Random  Result Value Ref Range Status   Specimen Description   Final    URINE, RANDOM Performed at [place] [address] [place], [place] [address]    Special Requests   Final    NONE Performed at [facility], [place] [address], [place], [place] [address]    Culture   Final    NO GROWTH Performed at [facility], [place] [address], [place], [place] [address]    Report Status 01/11/2020 FINAL  Final  Culture, respiratory (non-expectorated)     Status: None   Collection Time: 01/18/20  8:44 AM   Specimen: Tracheal Aspirate; Respiratory  Result Value Ref Range Status   Specimen Description TRACHEAL ASPIRATE  Final   Special Requests NONE  Final   Gram Stain   Final    NO WBC SEEN FEW GRAM  POSITIVE COCCI IN PAIRS IN CLUSTERS Performed at [facility], [address], [place], [place] [address]    Culture ABUNDANT STAPHYLOCOCCUS AUREUS  Final   Report Status 01/20/2020 FINAL  Final   Organism ID, Bacteria STAPHYLOCOCCUS AUREUS  Final      Susceptibility   Staphylococcus aureus - MIC*    CIPROFLOXACIN <=0.5 SENSITIVE Sensitive     ERYTHROMYCIN >=8 RESISTANT Resistant     GENTAMICIN <=0.5 SENSITIVE Sensitive     OXACILLIN <=0.25 SENSITIVE Sensitive     TETRACYCLINE <=1 SENSITIVE Sensitive     VANCOMYCIN <=0.5 SENSITIVE Sensitive     TRIMETH/SULFA <=10 SENSITIVE Sensitive     CLINDAMYCIN RESISTANT Resistant     RIFAMPIN <=0.5 SENSITIVE Sensitive     Inducible Clindamycin POSITIVE Resistant     * ABUNDANT STAPHYLOCOCCUS AUREUS  Culture, blood (Routine X 2) w Reflex to ID Panel     Status: Abnormal   Collection Time: 01/18/20 10:17 AM   Specimen: BLOOD  Result Value Ref Range Status   Specimen Description BLOOD LEFT ANTECUBITAL  Final   Special Requests   Final    BOTTLES DRAWN AEROBIC ONLY Blood Culture adequate volume   Culture  Setup Time   Final    AEROBIC BOTTLE ONLY GRAM POSITIVE COCCI Organism ID to follow CRITICAL RESULT CALLED TO, READ BACK BY AND VERIFIED WITH: Denton Brick [facility] 01/19/20 0044 JDW Performed at [facility], [address], [place], [place] [address]    Culture ENTEROCOCCUS FAECALIS (A)  Final   Report Status 01/20/2020 FINAL  Final   Organism ID, Bacteria ENTEROCOCCUS FAECALIS  Final      Susceptibility   Enterococcus faecalis - MIC*    AMPICILLIN <=2 SENSITIVE Sensitive     VANCOMYCIN 1 SENSITIVE Sensitive     GENTAMICIN SYNERGY SENSITIVE Sensitive     * ENTEROCOCCUS FAECALIS  Blood Culture ID Panel (Reflexed)     Status: Abnormal   Collection Time: 01/18/20 10:17 AM  Result Value Ref Range Status   Enterococcus faecalis DETECTED (A) NOT DETECTED Final    Comment: CRITICAL RESULT CALLED TO, READ BACK BY AND VERIFIED WITH: G  ABBOTT PHARMD 01/19/20  75 JDW    Enterococcus Faecium NOT DETECTED NOT DETECTED Final   Listeria monocytogenes NOT DETECTED NOT DETECTED Final   Staphylococcus species NOT DETECTED NOT DETECTED Final   Staphylococcus aureus (BCID) NOT DETECTED NOT DETECTED Final   Staphylococcus epidermidis NOT DETECTED NOT DETECTED Final   Staphylococcus lugdunensis NOT DETECTED NOT DETECTED Final   Streptococcus species NOT DETECTED NOT DETECTED Final   Streptococcus agalactiae NOT DETECTED NOT DETECTED Final   Streptococcus pneumoniae NOT DETECTED NOT DETECTED Final   Streptococcus pyogenes NOT DETECTED NOT DETECTED Final   A.calcoaceticus-baumannii NOT DETECTED NOT DETECTED Final   Bacteroides fragilis NOT DETECTED NOT DETECTED Final   Enterobacterales NOT DETECTED NOT DETECTED Final   Enterobacter cloacae complex NOT DETECTED NOT DETECTED Final   Escherichia coli NOT DETECTED NOT DETECTED Final   Klebsiella aerogenes NOT DETECTED NOT DETECTED Final   Klebsiella oxytoca NOT DETECTED NOT DETECTED Final   Klebsiella pneumoniae NOT DETECTED NOT DETECTED Final   Proteus species NOT DETECTED NOT DETECTED Final   Salmonella species NOT DETECTED NOT DETECTED Final   Serratia marcescens NOT DETECTED NOT DETECTED Final   Haemophilus influenzae NOT DETECTED NOT DETECTED Final   Neisseria meningitidis NOT DETECTED NOT DETECTED Final   Pseudomonas aeruginosa NOT DETECTED NOT DETECTED Final   Stenotrophomonas maltophilia NOT DETECTED NOT DETECTED Final   Candida albicans NOT DETECTED NOT DETECTED Final   Candida auris NOT DETECTED NOT DETECTED Final   Candida glabrata NOT DETECTED NOT DETECTED Final   Candida krusei NOT DETECTED NOT DETECTED Final   Candida parapsilosis NOT DETECTED NOT DETECTED Final   Candida tropicalis NOT DETECTED NOT DETECTED Final   Cryptococcus neoformans/gattii NOT DETECTED NOT DETECTED Final   Vancomycin resistance NOT DETECTED NOT DETECTED Final    Comment: Performed at [facility], [address] [place], [place] [address]  Culture, blood (Routine X 2) w Reflex to ID Panel     Status: Abnormal   Collection Time: 01/18/20 10:30 AM   Specimen: BLOOD LEFT ARM  Result Value Ref Range Status   Specimen Description BLOOD LEFT ARM  Final   Special Requests   Final    BOTTLES DRAWN AEROBIC ONLY Blood Culture adequate volume   Culture  Setup Time   Final    AEROBIC BOTTLE ONLY GRAM POSITIVE COCCI CRITICAL VALUE NOTED.  VALUE IS CONSISTENT WITH PREVIOUSLY REPORTED AND CALLED VALUE.    Culture (A)  Final    ENTEROCOCCUS FAECALIS SUSCEPTIBILITIES PERFORMED ON PREVIOUS CULTURE WITHIN THE LAST 5 DAYS. Performed at [facility], [place] [address] [place], [place] [address]    Report Status 01/20/2020 FINAL  Final  Culture, blood (routine x 2)     Status: None   Collection Time: 01/21/20  6:07 PM   Specimen: BLOOD LEFT HAND  Result Value Ref Range Status   Specimen Description BLOOD LEFT HAND  Final   Special Requests   Final    BOTTLES DRAWN AEROBIC AND ANAEROBIC Blood Culture adequate volume   Culture   Final    NO GROWTH 5 DAYS Performed at [facility], [place] [address] [place],  [address]    Report Status 01/26/2020 FINAL  Final  Culture, blood (routine x 2)     Status: None   Collection Time: 01/21/20  6:14 PM   Specimen: BLOOD LEFT FOREARM  Result Value Ref Range Status   Specimen Description BLOOD LEFT FOREARM  Final   Special Requests   Final  BOTTLES DRAWN AEROBIC ONLY Blood Culture adequate volume   Culture   Final    NO GROWTH 5 DAYS Performed at [facility], [place] [address] [place], [place] [address]    Report Status 01/26/2020 FINAL  Final  Culture, blood (Routine X 2) w Reflex to ID Panel     Status: None   Collection Time: 01/25/20  1:15 PM   Specimen: BLOOD  Result Value Ref Range Status   Specimen Description BLOOD LEFT ANTECUBITAL  Final   Special Requests   Final    BOTTLES DRAWN AEROBIC AND ANAEROBIC  Blood Culture adequate volume   Culture   Final    NO GROWTH 5 DAYS Performed at [facility], [place] [address], [place], [place] [address]    Report Status 01/30/2020 FINAL  Final  Culture, blood (Routine X 2) w Reflex to ID Panel     Status: None   Collection Time: 01/25/20  1:23 PM   Specimen: BLOOD LEFT FOREARM  Result Value Ref Range Status   Specimen Description BLOOD LEFT FOREARM  Final   Special Requests   Final    BOTTLES DRAWN AEROBIC AND ANAEROBIC Blood Culture adequate volume   Culture   Final    NO GROWTH 5 DAYS Performed at [facility], [place] [address] [place], [place] [address]    Report Status 01/30/2020 FINAL  Final  SARS Coronavirus 2 by RT PCR (hospital order, performed in [facility] hospital lab) Nasopharyngeal     Status: None   Collection Time: 01/29/20  9:00 AM   Specimen: Nasopharyngeal  Result Value Ref Range Status   SARS Coronavirus 2 NEGATIVE NEGATIVE Final    Comment: (NOTE) SARS-CoV-2 target nucleic acids are NOT DETECTED.  The SARS-CoV-2 RNA is generally detectable in upper and lower respiratory specimens during the acute phase of infection. The lowest concentration of SARS-CoV-2 viral copies this assay can detect is 250 copies / mL. A negative result does not preclude SARS-CoV-2 infection and should not be used as the sole basis for treatment or other patient management decisions.  A negative result may occur with improper specimen collection / handling, submission of specimen other than nasopharyngeal swab, presence of viral mutation(s) within the areas targeted by this assay, and inadequate number of viral copies (<250 copies / mL). A negative result must be combined with clinical observations, patient history, and epidemiological information.  Fact Sheet for Patients:   StrictlyIdeas.no  Fact Sheet for Healthcare Providers: BankingDealers.co.za  This test is not yet approved or   cleared by the [place] FDA and has been authorized for detection and/or diagnosis of SARS-CoV-2 by FDA under an Emergency Use Authorization (EUA).  This EUA will remain in effect (meaning this test can be used) for the duration of the COVID-19 declaration under Section 564(b)(1) of the Act, 21 U.S.C. section 360bbb-3(b)(1), unless the authorization is terminated or revoked sooner.  Performed at [facility], [place] [address] [place], [place] [address]   Culture, bal-quantitative     Status: Abnormal   Collection Time: 01/30/20 10:36 AM   Specimen: Bronchial Alveolar Lavage; Respiratory  Result Value Ref Range Status   Specimen Description Bronch Lavag  Final   Special Requests NONE  Final   Gram Stain   Final    ABUNDANT WBC PRESENT,BOTH PMN AND MONONUCLEAR NO ORGANISMS SEEN Performed at [facility], [address] [place], [place] [address]    Culture 10,000 COLONIES/mL CANDIDA  DUBLINIENSIS (A)  Final   Report Status 02/02/2020 FINAL  Final  Culture, blood (routine x 2)     Status: None   Collection Time: 01/31/20  5:36 PM   Specimen: BLOOD RIGHT HAND  Result Value Ref Range Status   Specimen Description BLOOD RIGHT HAND  Final   Special Requests   Final    BOTTLES DRAWN AEROBIC ONLY Blood Culture results may not be optimal due to an inadequate volume of blood received in culture bottles   Culture   Final    NO GROWTH 5 DAYS Performed at [facility], [place] [address], [place], [place] [address]    Report Status 02/05/2020 FINAL  Final  Culture, blood (routine x 2)     Status: None   Collection Time: 01/31/20  5:50 PM   Specimen: BLOOD RIGHT HAND  Result Value Ref Range Status   Specimen Description BLOOD RIGHT HAND  Final   Special Requests   Final    BOTTLES DRAWN AEROBIC ONLY Blood Culture results may not be optimal due to an inadequate volume of blood received in culture bottles   Culture   Final    NO GROWTH 5 DAYS Performed at [facility], [place] [address] [place], [place] [address]    Report Status 02/05/2020 FINAL  Final    Coagulation Studies: No results for input(s): LABPROT, INR in the last 72 hours.  Urinalysis: No results for input(s): COLORURINE, LABSPEC, PHURINE, GLUCOSEU, HGBUR, BILIRUBINUR, KETONESUR, PROTEINUR, UROBILINOGEN, NITRITE, LEUKOCYTESUR in the last 72 hours.  Invalid input(s): APPERANCEUR    Imaging: DG CHEST PORT 1 VIEW  Result Date: 02/08/2020 CLINICAL DATA:  Respiratory failure on ECMO.  Recent COVID. EXAM: PORTABLE CHEST 1 VIEW COMPARISON:  02/07/2020 FINDINGS: Similar positioning of a midline tracheostomy tube, left IJ approach central venous catheter, left subclavian central line, right PICC line, feeding tube and ECMO device. Diffuse opacification of both hemi thoraces is again noted with slightly decreased aeration of the lungs. Cardiac silhouette is obscured. Prior cervical fusion hardware. IMPRESSION: Slightly decreased aeration of the lungs. Otherwise, no substantial change. Electronically Signed   By: Margaretha Sheffield MD   On: 02/08/2020 08:20     Medications:   .  prismasol BGK 4/2.5 300 mL/hr at 02/09/20 0500  . sodium chloride Stopped (02/09/20 0538)  . sodium chloride 10 mL/hr at 02/09/20 0600  . albumin human 12.5 g (02/07/20 1424)  . albumin human Stopped (02/06/20 2000)  . amiodarone 30 mg/hr (02/09/20 0600)  . dexmedetomidine (PRECEDEX) IV infusion 0.2 mcg/kg/hr (02/09/20 0600)  . feeding supplement (PIVOT 1.5 CAL) 75 mL/hr at 02/08/20 2015  . heparin 500 Units/hr (02/09/20 0600)  . HYDROmorphone 0.5 mg/hr (02/09/20 0600)  . insulin 10 mL/hr at 02/09/20 0600  . milrinone Stopped (02/05/20 0905)  . norepinephrine (LEVOPHED) Adult infusion 10 mcg/min (02/09/20 0600)  . piperacillin-tazobactam (ZOSYN)  IV 200 mL/hr at 02/09/20 0600  . prismasol BGK 2/2.5 replacement solution 500 mL/hr at 02/09/20 0500  . prismasol BGK 4/2.5 2,000 mL/hr at 02/09/20 0500  . vasopressin  Stopped (02/06/20 1741)   . sodium chloride   Intravenous Once  . sodium chloride   Intravenous Once  . vitamin C  500 mg Per Tube Daily  . B-complex with vitamin C  1 tablet Oral Daily  . chlorhexidine gluconate (MEDLINE KIT)  15 mL Mouth Rinse BID  . Chlorhexidine Gluconate Cloth  6 each Topical Daily  . clonazePAM  1 mg Per Tube  Q6H  . docusate  100 mg Per Tube BID  . feeding supplement (PROSource TF)  45 mL Per Tube TID  . fentaNYL (SUBLIMAZE) injection  50 mcg Intravenous Once  . linagliptin  5 mg Per Tube Daily  . mouth rinse  15 mL Mouth Rinse 10 times per day  . oxyCODONE  10 mg Per Tube Q6H  . pantoprazole (PROTONIX) IV  40 mg Intravenous Daily  . polyethylene glycol  17 g Per Tube Daily  . sennosides  5 mL Per Tube BID  . zinc sulfate  220 mg Per Tube Daily   sodium chloride, sodium chloride, acetaminophen (TYLENOL) oral liquid 160 mg/5 mL, albumin human, albuterol, dextrose, fentaNYL, fentaNYL (SUBLIMAZE) injection, guaiFENesin-dextromethorphan, heparin, hydrALAZINE, HYDROmorphone, hydrOXYzine, ondansetron **OR** ondansetron (ZOFRAN) IV, sodium chloride  Assessment/ Plan:  1.  Anuric AKI due to shock, sepsis causing acute tubular necrosis: Started CRRT on 8/22 for uremia and volume overload. She continues on ECMO. Continue CRRT, prognosis grim.  Now removing about 50 cc-100 an hour  2.Shock due to sepsis, Covid infection, Enterococcus bacteremia: On pressors, antibiotics per primary team.  3.Acute hypoxic and hypercapnic respiratory failure, ARDS due to COVID-19 pneumonia: Currently on ECMO for refractory hypoxemia, MSSA pneumonia.  On broad-spectrum antibiotics.  IV Zosyn and IV Eraxis appreciate assistance from infectious disease.  Status post tracheostomy 04/06/2020  3.Hypernatremia, hypervolemia: relative free water deficit.  Improved with CRRT.  4.Acute toxic metabolic encephalopathy:Multifactorial with uremia contributing. Treatment with CRRT as  above.  5.Anemia, thrombocytopenia: With hemolysis likely and acute illness contributing. Transfusions per primary team.  Seen by hematologist.  6.Intracranial hemorrhage: With some left-sided weakness preceded by primary team.   7. GOC: Multiorgan failure.   family meeting on 8/25, continue full scope of treatment.  Long-term prognosis poor.  8.  Diabetes as per primary service continues on oral Tradjenta.  9.  Hypophosphatemia will replete    LOS: [place] _0 _1 :48 AM

## 2020-02-09 NOTE — Progress Notes (Signed)
Patient ID: Alisha Stephenson, female   DOB: 08-14-1965, 54 y.o.   MRN: [id number]    Advanced Heart Failure Rounding Note   Subjective:    - 8/2 COVID + test - 8/9 Cannulated for VV ECMO - 8/13 with several areas of intracranial hemorrhage. Bival stopped.  - 8/14 CT no change in [place]. Increased edema - 8/16 Extubated - 8/16 Head CT stable bleed - 8/22 CVVHD started - 8/24 reintubated - 8/24 circuit changed - 8/30 trach  Remains awake on vent. Will follow commands. Very weak. Cannot move upper extremities much  Heparin back on. Trach site stopped bleeding. On/off norepi for BP site.   Remains on CVVHD. Kept even most of the day.   Off sedatives.  CXR with persistent diffuse infiltrates   ECMO   Flow 4.8 L RPM 4000 Sweep12.5  Labs:  7.39/42/50/86% Hgb8.7 PLT 111k LDH 477 Lactic acid1.6   Objective:   Weight Range:  Vital Signs:   Temp:  [97.7 F (36.5 C)-99.1 F (37.3 C)] 99.1 F (37.3 C) (09/03 0545) Pulse Rate:  [77-103] 96 (09/03 0730) Resp:  [15-28] 22 (09/03 0730) BP: (105-145)/(51-54) 138/51 (09/03 0730) SpO2:  [78 %-100 %] 87 % (09/03 0730) Arterial Line BP: (86-165)/(44-62) 124/47 (09/03 0715) FiO2 (%):  [40 %] 40 % (09/03 0730) Weight:  [126 kg] 126 kg (09/03 0500) Last BM Date: 02/08/20 (stool in rectal tube)  Weight change: Filed Weights   02/07/20 0411 02/08/20 0530 02/09/20 0500  Weight: 121 kg 130.6 kg 126 kg    Intake/Output:   Intake/Output Summary (Last 24 hours) at 02/09/2020 0751 Last data filed at 02/09/2020 0700 Gross per 24 hour  Intake 4299.1 ml  Output 6031 ml  Net -1731.9 ml     Physical Exam: General:  Awake on vent HEENT: normal Neck: supple. +trach. R ECMO cannula L  trialysis Carotids 2+ bilat; no bruits. No lymphadenopathy or thryomegaly appreciated. Cor: PMI nondisplaced. Regular rate & rhythm. No rubs, gallops or murmurs. Lungs: low volumes Abdomen: obese soft, nontender, nondistended. No  hepatosplenomegaly. No bruits or masses. Good bowel sounds. Extremities: no cyanosis, clubbing, rash, 1-2+ edema Neuro: awake on vent. Follows commands but very weak unable to move upper extremities     Telemetry: Sinus 90-100s Personally reviewed   Labs: Basic Metabolic Panel: Recent Labs  Lab 02/05/20 0304 02/05/20 0403 02/06/20 0306 02/06/20 0433 02/07/20 0305 02/07/20 0326 02/07/20 1800 02/07/20 2025 02/08/20 0414 02/08/20 0513 02/08/20 1628 02/08/20 2303 02/09/20 0019 02/09/20 0341 02/09/20 0352  NA 137   < > 135   < > 137   < > 136   < > 139   < > 138 140 138 137 139  K 4.3   < > 3.7   < > 3.7   < > 4.3   < > 4.0   < > 4.5 4.4 4.7 4.1 3.9  CL 102   < > 101   < > 101  --  103  --  104  --  102  --   --  102  --   CO2 23   < > 24   < > 22  --  23  --  24  --  25  --   --  24  --   GLUCOSE 221*   < > 187*   < > 180*  --  200*  --  135*  --  129*  --   --  215*  --  BUN 44*   < > 39*   < > 32*  --  31*  --  34*  --  33*  --   --  33*  --   CREATININE 1.08*   < > 0.91   < > 0.79  --  0.82  --  0.80  --  0.87  --   --  0.94  --   CALCIUM 8.1*   < > 8.0*   < > 8.0*   < > 8.2*   < > 8.0*  --  8.1*  --   --  8.1*  --   MG 2.8*  --  2.5*  --  2.5*  --   --   --  2.4  --   --   --   --  2.8*  --   PHOS 2.9  --  3.1  --  3.0  --   --   --  3.1  --   --   --   --  2.2*  --    < > = values in this interval not displayed.    Liver Function Tests: Recent Labs  Lab 02/03/20 0313 02/04/20 0404 02/05/20 0304  AST 162* 105* 73*  ALT 165* 158* 151*  ALKPHOS 252* 243* 264*  BILITOT 1.6* 1.8* 1.6*  PROT 5.7* 5.7* 5.7*  ALBUMIN 2.3* 2.2* 2.1*   No results for input(s): LIPASE, AMYLASE in the last 168 hours. No results for input(s): AMMONIA in the last 168 hours.  CBC: Recent Labs  Lab 02/07/20 0311 02/07/20 0326 02/07/20 1800 02/07/20 2025 02/08/20 0008 02/08/20 0008 02/08/20 0414 02/08/20 0513 02/08/20 1628 02/08/20 2303 02/09/20 0019 02/09/20 0341  02/09/20 0352  WBC 13.6*   < > 13.5*  --  14.3*  --  14.2*  --  17.7*  --   --  16.4*  --   NEUTROABS 10.3*  --   --   --   --   --   --   --   --   --   --   --   --   HGB 8.7*   < > 8.1*   < > 7.7*   < > 8.7*   < > 8.9* 9.9* 8.5* 8.7* 9.5*  HCT 27.7*   < > 25.7*   < > 24.5*   < > 28.2*   < > 28.6* 29.0* 25.0* 28.0* 28.0*  MCV 102.2*   < > 104.5*  --  106.1*  --  102.9*  --  105.5*  --   --  105.7*  --   PLT 69*   < > 81*  --  93*  --  99*  --  104*  --   --  111*  --    < > = values in this interval not displayed.    Cardiac Enzymes: No results for input(s): CKTOTAL, CKMB, CKMBINDEX, TROPONINI in the last 168 hours.  BNP: BNP (last 3 results) No results for input(s): BNP in the last 8760 hours.  ProBNP (last 3 results) No results for input(s): PROBNP in the last 8760 hours.    Other results:  Imaging: DG CHEST PORT 1 VIEW  Result Date: 02/09/2020 CLINICAL DATA:  COVID positive.  Respiratory failure.  ECMO. EXAM: PORTABLE CHEST 1 VIEW COMPARISON:  Multiple recent previous exams. FINDINGS: Confluent bilateral airspace opacity is again noted and appears progressive in left upper lobe. Tracheostomy tube again noted in situ. Right PICC line tip is difficult  to visualize but is probably in the right subclavian vein but may be obscured by the ECMO catheter. Left IJ central line tip is positioned over the innominate vein confluence. There is a multi lumen left subclavian catheter with the tip overlying the medial aspect of the left subclavian vein. IMPRESSION: 1. Persistent diffuse bilateral airspace disease with interval progression in the left upper lobe. 2. Stable position of support tubing. Electronically Signed   By: Misty Stanley M.D.   On: 02/09/2020 07:21   DG CHEST PORT 1 VIEW  Result Date: 02/08/2020 CLINICAL DATA:  Respiratory failure on ECMO.  Recent COVID. EXAM: PORTABLE CHEST 1 VIEW COMPARISON:  02/07/2020 FINDINGS: Similar positioning of a midline tracheostomy tube, left IJ  approach central venous catheter, left subclavian central line, right PICC line, feeding tube and ECMO device. Diffuse opacification of both hemi thoraces is again noted with slightly decreased aeration of the lungs. Cardiac silhouette is obscured. Prior cervical fusion hardware. IMPRESSION: Slightly decreased aeration of the lungs. Otherwise, no substantial change. Electronically Signed   By: Margaretha Sheffield MD   On: 02/08/2020 08:20     Medications:     Scheduled Medications:  sodium chloride   Intravenous Once   sodium chloride   Intravenous Once   vitamin C  500 mg Per Tube Daily   B-complex with vitamin C  1 tablet Oral Daily   chlorhexidine gluconate (MEDLINE KIT)  15 mL Mouth Rinse BID   Chlorhexidine Gluconate Cloth  6 each Topical Daily   clonazePAM  1 mg Per Tube Q6H   docusate  100 mg Per Tube BID   feeding supplement (PROSource TF)  45 mL Per Tube TID   fentaNYL (SUBLIMAZE) injection  50 mcg Intravenous Once   linagliptin  5 mg Per Tube Daily   mouth rinse  15 mL Mouth Rinse 10 times per day   oxyCODONE  10 mg Per Tube Q6H   pantoprazole (PROTONIX) IV  40 mg Intravenous Daily   polyethylene glycol  17 g Per Tube Daily   sennosides  5 mL Per Tube BID   zinc sulfate  220 mg Per Tube Daily    Infusions:   prismasol BGK 4/2.5 300 mL/hr at 02/09/20 0500   sodium chloride 10 mL/hr at 02/09/20 0700   sodium chloride 10 mL/hr at 02/09/20 0700   albumin human 12.5 g (02/07/20 1424)   albumin human Stopped (02/06/20 2000)   amiodarone 30 mg/hr (02/09/20 0700)   dexmedetomidine (PRECEDEX) IV infusion Stopped (02/09/20 0616)   feeding supplement (PIVOT 1.5 CAL) 75 mL/hr at 02/08/20 2015   heparin 500 Units/hr (02/09/20 0700)   HYDROmorphone Stopped (02/09/20 0603)   insulin 7.5 Units/hr (02/09/20 0721)   milrinone Stopped (02/05/20 0905)   norepinephrine (LEVOPHED) Adult infusion 4 mcg/min (02/09/20 0700)   piperacillin-tazobactam (ZOSYN)  IV  Stopped (02/09/20 0610)   prismasol BGK 2/2.5 replacement solution 500 mL/hr at 02/09/20 0500   prismasol BGK 4/2.5 2,000 mL/hr at 02/09/20 0732   sodium phosphate  Dextrose 5% IVPB     vasopressin Stopped (02/06/20 1741)    PRN Medications: sodium chloride, sodium chloride, acetaminophen (TYLENOL) oral liquid 160 mg/5 mL, albumin human, albuterol, dextrose, fentaNYL, fentaNYL (SUBLIMAZE) injection, guaiFENesin-dextromethorphan, heparin, hydrALAZINE, HYDROmorphone, hydrOXYzine, ondansetron **OR** ondansetron (ZOFRAN) IV, sodium chloride   Assessment/Plan:   1. Acute hypoxic/hypercapneic respiratory failure in setting of severe COVID PNA/ARDS -> VV ECMO - admit 8/2 - intubation 8/3 - has received actmera (compelted 8/2), remdesivir (completed 8/6) and steroids -  failed full vent support with proning/paralytic -> Cannulated for VV ECMO on 8/9 - Extubated 8/16. Reintubated 8/24 - CXR unchanged -> diffuse infiltrates no change Personally reviewed - s/p trach 8/30. Oozing from trach. Will pack again. Continue to hold heparin. TEG unrevealing - Circuit changed 8/24 due to concern for hemolysis and worsening oxygenation. Oxygenation improved. LDH down. PLTs rebounding slolwy  - CVVHD started 8/22 for volume removal and uremia. Remains anuric.Volume status improved. Keep even to mildly negative - On/off NE. Will start midodrine  - Awake on vent today. Has grown increasingly weak. Not moving upper body. Suspect severe critical illness myopathy. PT to see today. She will have a long road as she seems unable to move upper body appreciably.  Lurline Idol site ok. Back on  heparin - sats marginal. Continue high-flow on ECMO. CXR slightly worse - add melatonin and seroquel for sleep at night   2. Enterococcus sepsis - back on high-dose zosyn per ID - Eraxis added on 8/26 with yeast in BAL 8/24 (ended 9/2) Discussed dosing with PharmD personally. - WBC up slightly. May need to reculture soon  3.  Intracranial hemorrhage - ? Septic emboli - repeat head CT on 8/14 with stable bleeds but increased edema - neurology has seen. Suspect significant long-term injury sustained. Will follow commands. Appears to have dense LUE weakness and possibly LLE - repeat head CT stable 8/16 - tolerating low-dose heparin - Consider TEE at some point to further evalaute. Can consider now that she is re-intubated but won't change management currently  4. Thrombocytopenia - PLTs up > 100k - Continue to follow  5. Morbid obesity - Body mass index is Body mass index is 47.68 kg/m.  6. Poorly controlled DM2 - HgBA1c 10.7 - CBGs remain elevated - On high-dose IV insulin and sugars still high  7. PAF - intermittent episodes. Last 8/26 - In NSR on IV amio this am. Can change to po   8. AKI/azotemia - CVVHD started on 8/22 - Down 40 pounds.  - Keep even to slightly negative  10. Ischemic R fingers - ? Due to septic embolic versus vascular issue - VVS following. No change  11. Acute liver failure - has underlying fatty liver - mix of conjugated/unconjugated - LFTs trending back down   CRITICAL CARE Performed by: Glori Bickers  Total critical care time: 40 minutes  Critical care time was exclusive of separately billable procedures and treating other patients.  Critical care was necessary to treat or prevent imminent or life-threatening deterioration.  Critical care was time spent personally by me (independent of midlevel providers or residents) on the following activities: development of treatment plan with patient and/or surrogate as well as nursing, discussions with consultants, evaluation of patient's response to treatment, examination of patient, obtaining history from patient or surrogate, ordering and performing treatments and interventions, ordering and review of laboratory studies, ordering and review of radiographic studies, pulse oximetry and re-evaluation of patient's  condition.   Length of Stay: 72   Glori Bickers  MD 02/09/2020, 7:51 AM  Advanced Heart Failure Team Pager [phone] (M-F; 7a - 4p)  Please contact [facility] for night-coverage after hours (4p -7a ) and weekends on [email or web address]

## 2020-02-09 NOTE — Procedures (Signed)
Extracorporeal support note   ECLS support day: 24 (cannulated 01/09/2020) Indication: Covid 19 pneumonia/ARDS  Configuration: VV  Drainage cannula: right IJ [place]  Return cannula: same  Pump speed: 4000 Pump flow: 4.67 Pump used: Centrimag  Oxygenator: Quadrox O2 blender: 100% Sweep gas: 12  Circuit check: no visible clot Anticoagulant: Continue heparin Anticoagulation targets: heparin fixed dose 500 units per hour  Changes in support: con't current flow and sweep  Anticipated goals/duration of support: bridge to recovery  Alisha Hy, DO 02/09/20 4:38 PM [facility]

## 2020-02-09 NOTE — Progress Notes (Signed)
NAME:  Alisha Stephenson, MRN:  [id number], DOB:  [birth year], LOS: 36 ADMISSION DATE:  01/12/2020, CONSULTATION DATE:  8/3 REFERRING MD:  Alfredia Ferguson, CHIEF COMPLAINT:  Dyspnea   Brief History   54 y/o female admitted on 8/2 with severe acute respiratory failure with hypoxemia due to COVID 19 pneumonia.  She developed symptoms 1 week prior to admission.  Past Medical History  DM2 Diverticulitis Gallstones Ovarian cyst NAFLD Asthma  Significant Hospital Events   8/2 admit 8/3 ICU transfer, intubated 8/4 prone, paralyze 8/9 significant desaturations today 8/9 VV ECMO cannulation 8/13 ICH 8/30 trach  Consults:  PCCM ECMO team  Procedures:  8/3 ETT > 8/30 8/30 8-0 shiley trach 8/3 PICC >  8/9 LIJ MML 8/9 RIJ [place] 45F   Significant Diagnostic Tests:  7/31 CT head > NAICP 7/31 MRI/MRA brain > no acute changes, possibly small aneurysm ACOM 8/13 CT head> multiple areas of ICH  Micro Data:  8/2 blood > NG 8/2 SARS COV 2 > positive 8/4 resp > negative 8/4 urine >  8/12 blood > E. faecalis (pan-sensitive) 8/12 resp: staph aureus> MSSA 8/25 blood>> NG 8/24 BAL> yeast  Antimicrobials:  See fever tab for past abx Current Eraxis 8/26>> Zosyn 8/23>>  Interim history/subjective:  Vomiting this morning.   Objective   Blood pressure (!) 105/54, pulse 97, temperature 99.1 F (37.3 C), temperature source Axillary, resp. rate (!) 23, height _0  (1.626 m), weight 126 kg, SpO2 (!) 85 %. CVP:  [12 mmHg-24 mmHg] 18 mmHg  Vent Mode: PCV FiO2 (%):  [40 %] 40 % Set Rate:  [15 bmp] 15 bmp PEEP:  [8 cmH20] 8 cmH20 Plateau Pressure:  [16 cmH20-25 cmH20] 20 cmH20   Intake/Output Summary (Last 24 hours) at 02/09/2020 0713 Last data filed at 02/09/2020 0700 Gross per 24 hour  Intake 4299.1 ml  Output 6031 ml  Net -1731.9 ml   Filed Weights   02/07/20 0411 02/08/20 0530 02/09/20 0500  Weight: 121 kg 130.6 kg 126 kg    Examination:  GEN: ill appearing woman laying in bed in  NAD HEENT:  eyes anicteric, /AT Neck: Trach in place, no active bleeding CV: Regular rate and rhythm PULM: Rales bilaterally, tidal volumes 160 cc GI: obese, soft, NT, ND EXT: Peripheral edema.  Blistering wounds on right digits.  Continued swelling and bruising on left hand and digits. NEURO: Tracks, nods to answer questions.  Globally very weak.  Attempting to wiggle her toes, but not moving her upper extremities at all. PSYCH: RASS = 0 SKIN: Bruising over her left hand, bruise on her left forearm.  Pallor.  No rashes.  Plts improved, greater than 100 Sugars labile LDH 1016>>756>>491> 484> 477 Hgb stable 8.7 ABG pO2 50 Vent PC 12/12 rate 15 Lactic 1.6>>1.3>>1.8>>1.7>>1.8> 1.6 Fibrinogen 756>>606> 609 INR 1.1 APTT 50  Resolved Hospital Problem list     Assessment & Plan:    Critically ill due to Acute hypoxic and AoC hypercapneic respiratory failure; likely underly OHS requiring VV ECMO support and mechanical ventilation.  ARDS due to COVID 19 pneumonia   MSSA pneumonia Fungal pneumonia- Candida dublinensis Tracheostomy in place; previously had bleeding -Continue ultra lung protective ventilation.  -Continue VV ECMO support -VAP prevention protocol -Not currently requiring sedation for mechanical ventilation. -Routine trach care; remove araxpack hemostatic dressing today -Antimicrobials per ID  Enterococcal bacteremia-recent blood cultures cleared Concern for possible embolic phenomenon- fingers and possibly CVAs were embolic  Septic shock -Antimicrobials per ID recommendations. -Reculture if escalating  vasopressor requirements or new fevers  Elevated LDH, hypo-fibrinogenemia and thrombocytopenia--improving -Continue to monitor  ICH, multifocal. L-sided weakness.  Critical illness myopathy -Continue PT, OT  Acute metabolic encephalopathy, improved ICU delirium -Sedation requirements minimal; only PRN now -Seroquel and melatonin nightly -Continue renal  replacement therapy. -Delirium precautions   Acute renal failure, severe uremia.  Anuric. -Continue CRRT -Renally dose medications -Strict I/os  Shock related to  RV strain - Con't milrinone   Hyperglycemia > not controlled at all with Mobile insulin despite aggressive upward titration.  Assume poor absorption, potentially due to subcutaneous edema.   -Remains elevated on IV.     Best practice:  Diet: tube feeding Pain/Anxiety/Delirium protocol (if indicated): as above VAP protocol (if indicated): yes DVT prophylaxis: SCDs , heparin on hold for trach oozing GI prophylaxis: pantoprazole Glucose control:  insulin infusion  Mobility: bed rest Code Status: limited Family Communication: Husband at bedside Disposition: ICU    This patient is critically ill with multiple organ system failure which requires frequent high complexity decision making, assessment, support, evaluation, and titration of therapies. This was completed through the application of advanced monitoring technologies and extensive interpretation of multiple databases. During this encounter critical care time was devoted to patient care services described in this note for 43 minutes.   Julian Hy, DO 02/09/20 4:34 PM [facility]

## 2020-02-09 NOTE — Progress Notes (Signed)
ANTICOAGULATION CONSULT NOTE - [facility] for Heparin Indication: ECMO  Allergies  Allergen Reactions  . Sulfa Antibiotics Rash and Other (See Comments)    Patient Measurements: Height: 5\' 4"  (162.6 cm) Weight: 130.6 kg (287 lb 14.7 oz) IBW/kg (Calculated) : 54.7 Heparin Dosing Weight: 87 kg  Vital Signs: Temp: 99.1 F (37.3 C) (09/03 0545) Temp Source: Axillary (09/03 0545) BP: 105/54 (09/02 2338) Pulse Rate: 95 (09/03 0615)  Labs: Recent Labs    02/07/20 0306 02/07/20 1800 02/07/20 2025 02/08/20 0414 02/08/20 0415 02/08/20 0513 02/08/20 1006 02/08/20 1420 02/08/20 1628 02/08/20 2303 02/09/20 0019 02/09/20 0019 02/09/20 0341 02/09/20 0352  HGB  --  8.1*   < > 8.7*  --    < >   < >  --  8.9*   < > 8.5*   < > 8.7* 9.5*  HCT  --  25.7*   < > 28.2*  --    < >   < >  --  28.6*   < > 25.0*  --  28.0* 28.0*  PLT  --  81*   < > 99*  --   --   --   --  104*  --   --   --  111*  --   APTT  --   --    < > 41*  --   --   --  49*  --   --   --   --  50*  --   HEPARINUNFRC   < >  --   --   --  <0.10*  --   --  <0.10*  --   --   --   --  <0.10*  --   CREATININE  --  0.82  --  0.80  --   --   --   --  0.87  --   --   --   --   --    < > = values in this interval not displayed.    Estimated Creatinine Clearance: 98.2 mL/min (by C-G formula based on SCr of 0.87 mg/dL).   Medical History: Past Medical History:  Diagnosis Date  . Asthma   . Diabetes mellitus without complication ([place])   . Diverticulitis   . Gallstones   . IBS (irritable bowel syndrome)   . NAFLD (nonalcoholic fatty liver disease)    CT scan 2015  . Ovarian cyst     Assessment: 54 yo female who is COVID+ on VV ECMO. Patient was started on bivalirudin but was found to have multiple ICH on 8/13 head CT. Bivalirudin was stopped at this time and pt has been off anticoagulation. Her ECMO circuit was changed 8/24, and pharmacy asked to start low-fixed rate heparin.  Heparin infusion  has been off since 8/31 due to bleeding from trach site. Hgb 9.9, plts increased to 99 this morning. Fibrinogen 609, LDH 484. ECMO circuit is stable - mild clots in circuit corners.   Heparin at 500 units/hr  with plans to not titrate for now. Heparin level remains undetectable, aPTT = 50.  Goal of Therapy:  Heparin level 0.2-0.5 units/ml - currently not titrating to goal per discussion with ECMO team Monitor platelets by anticoagulation protocol: Yes   Plan:  Continue Heparin at 500 units/hr  Monitor bleeding around trach site closely Monitor daily CBC, LDH, fibrinogen, s/sx of bleeding  Thank you for allowing pharmacy to be a part of this patient's care.  Janett Billow  Toney Rakes, BCCP Clinical Pharmacist  02/09/2020 6:28 AM   [facility] pharmacy phone numbers are listed on [email or web address]

## 2020-02-09 NOTE — Progress Notes (Signed)
Occupational Therapy Treatment Patient Details Name: Alisha Stephenson MRN: [id number] DOB: 1966-03-01 Today's Date: 02/09/2020    History of present illness 54 yo admitted 8/2 with hypoxemia due to Covid 19 PNA. 8/3 intubated. 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 CRRT initiated 8/22. Reintubated 8/24 with ECMO circuit changed, 8/30 trach. PMHx: DM, diverticulitis, Asthma   OT comments  Pt seen seated in chair position in tilt bed with husband present and RN monitoring equipment. Focus of session on B U/L extremity P/AAROM and retrograde massage of B UEs. Pt putting forth good effort and smiling/making facial expressions at times. Will continue to follow.  Follow Up Recommendations  LTACH;Supervision/Assistance - 24 hour    Equipment Recommendations       Recommendations for Other Services      Precautions / Restrictions Precautions Precautions: Other (comment) Precaution Comments: ECMO with RIJ access anchors at right shoulder and chest, right necrotic thumb and index finger, bruised/necrotic left hand, CRRT left subclavian, trach, vent, flexiseal Other Brace: Prevalon Boots       Mobility Bed Mobility               General bed mobility comments: pt with Kreg bed in sitting position  Transfers                      Balance                                           ADL either performed or assessed with clinical judgement   ADL                                         General ADL Comments: currently total A     Vision       Perception     Praxis      Cognition Arousal/Alertness: Awake/alert Behavior During Therapy: WFL for tasks assessed/performed (smiling at times) Overall Cognitive Status: Difficult to assess Area of Impairment: Following commands                       Following Commands: Follows one step commands with increased time       General  Comments: pt alert, smiling at times, nodding/shaking head         Exercises General Exercises - Upper Extremity Shoulder Flexion: AAROM;Both;10 reps;Seated (within CRRT limitations) Elbow Flexion: AAROM;Right;Seated;10 reps Elbow Extension: AAROM;Both;10 reps;Seated Wrist Flexion: AAROM;Both;10 reps;Seated Wrist Extension: AAROM;Both;10 reps;Seated Digit Composite Flexion: PROM;AAROM;Both;5 reps;Seated Composite Extension: Both;PROM;AAROM;5 reps;Seated General Exercises - Lower Extremity Long Arc Quad: PROM;Both;10 reps;Seated Hip Flexion/Marching: AAROM;Both;10 reps;Seated Hand Exercises Forearm Supination: AAROM;5 reps;Both;Supine;Seated Other Exercises Other Exercises: gentle retrograde massage to bil hands   Shoulder Instructions       General Comments      Pertinent Vitals/ Pain       Pain Assessment: Faces Faces Pain Scale: No hurt  Home Living                                          Prior Functioning/Environment              Frequency  Min 2X/week        Progress Toward Goals  OT Goals(current goals can now be found in the care plan section)  Progress towards OT goals: Progressing toward goals  Acute Rehab OT Goals Patient Stated Goal: per husband, to see her dog [name] OT Goal Formulation: Patient unable to participate in goal setting Time For Goal Achievement: 02/23/20 Potential to Achieve Goals: [place] Discharge plan remains appropriate    Co-evaluation                 AM-PAC OT "6 Clicks" Daily Activity     Outcome Measure   Help from another person eating meals?: Total Help from another person taking care of personal grooming?: Total Help from another person toileting, which includes using toliet, bedpan, or urinal?: Total Help from another person bathing (including washing, rinsing, drying)?: Total Help from another person to put on and taking off regular upper body clothing?: Total Help from another person  to put on and taking off regular lower body clothing?: Total 6 Click Score: 6    End of Session    OT Visit Diagnosis: Muscle weakness (generalized) (M62.81);Other symptoms and signs involving cognitive function   Activity Tolerance Patient tolerated treatment well   Patient Left in bed;with call bell/phone within reach;with family/visitor present;with nursing/sitter in room (in chair position)   Nurse Communication Other (comment) (RN in room managing CRRT and ECMO)        Time: 1610-9604 OT Time Calculation (min): 26 min  Charges: OT General Charges $OT Visit: 1 Visit OT Treatments $Therapeutic Exercise: 23-37 mins  Nestor Lewandowsky, OTR/L Acute Rehabilitation Services Pager: [phone] Office: [phone]   Malka So 02/09/2020, 4:30 PM

## 2020-02-09 NOTE — Plan of Care (Signed)
Problem: Education: Goal: Knowledge of General Education information will improve Description: Including pain rating scale, medication(s)/side effects and non-pharmacologic comfort measures Outcome: Progressing   Problem: Health Behavior/Discharge Planning: Goal: Ability to manage health-related needs will improve Outcome: Progressing   Problem: Clinical Measurements: Goal: Ability to maintain clinical measurements within normal limits will improve Outcome: Progressing Goal: Will remain free from infection Outcome: Progressing Goal: Diagnostic test results will improve Outcome: Progressing Goal: Respiratory complications will improve Outcome: Progressing Goal: Cardiovascular complication will be avoided Outcome: Progressing   Problem: Activity: Goal: Risk for activity intolerance will decrease Outcome: Progressing   Problem: Nutrition: Goal: Adequate nutrition will be maintained Outcome: Progressing   Problem: Coping: Goal: Level of anxiety will decrease Outcome: Progressing   Problem: Elimination: Goal: Will not experience complications related to bowel motility Outcome: Progressing Goal: Will not experience complications related to urinary retention Outcome: Progressing   Problem: Pain Managment: Goal: General experience of comfort will improve Outcome: Progressing   Problem: Safety: Goal: Ability to remain free from injury will improve Outcome: Progressing   Problem: Skin Integrity: Goal: Risk for impaired skin integrity will decrease Outcome: Progressing   Problem: Education: Goal: Knowledge of risk factors and measures for prevention of condition will improve Outcome: Progressing   Problem: Coping: Goal: Psychosocial and spiritual needs will be supported Outcome: Progressing   Problem: Respiratory: Goal: Will maintain a patent airway Outcome: Progressing Goal: Complications related to the disease process, condition or treatment will be avoided or  minimized Outcome: Progressing   Problem: Education: Goal: Knowledge of risk factors and measures for prevention of condition will improve Outcome: Progressing   Problem: Coping: Goal: Psychosocial and spiritual needs will be supported Outcome: Progressing   Problem: Respiratory: Goal: Will maintain a patent airway Outcome: Progressing Goal: Complications related to the disease process, condition or treatment will be avoided or minimized Outcome: Progressing   Problem: Education: Goal: Ability to describe self-care measures that may prevent or decrease complications (Diabetes Survival Skills Education) will improve Outcome: Progressing Goal: Individualized Educational Video(s) Outcome: Progressing   Problem: Coping: Goal: Ability to adjust to condition or change in health will improve Outcome: Progressing   Problem: Fluid Volume: Goal: Ability to maintain a balanced intake and output will improve Outcome: Progressing   Problem: Health Behavior/Discharge Planning: Goal: Ability to identify and utilize available resources and services will improve Outcome: Progressing Goal: Ability to manage health-related needs will improve Outcome: Progressing   Problem: Metabolic: Goal: Ability to maintain appropriate glucose levels will improve Outcome: Progressing   Problem: Nutritional: Goal: Maintenance of adequate nutrition will improve Outcome: Progressing Goal: Progress toward achieving an optimal weight will improve Outcome: Progressing   Problem: Skin Integrity: Goal: Risk for impaired skin integrity will decrease Outcome: Progressing   Problem: Tissue Perfusion: Goal: Adequacy of tissue perfusion will improve Outcome: Progressing   Problem: Education: Goal: Knowledge of risk factors and measures for prevention of condition will improve Outcome: Progressing   Problem: Coping: Goal: Psychosocial and spiritual needs will be supported Outcome: Progressing    Problem: Respiratory: Goal: Will maintain a patent airway Outcome: Progressing Goal: Complications related to the disease process, condition or treatment will be avoided or minimized Outcome: Progressing   Problem: Education: Goal: Knowledge of disease or condition will improve Outcome: Progressing Goal: Knowledge of secondary prevention will improve Outcome: Progressing Goal: Knowledge of patient specific risk factors addressed and post discharge goals established will improve Outcome: Progressing Goal: Individualized Educational Video(s) Outcome: Progressing   Problem: Coping: Goal: Will verbalize  positive feelings about self Outcome: Progressing Goal: Will identify appropriate support needs Outcome: Progressing   Problem: Health Behavior/Discharge Planning: Goal: Ability to manage health-related needs will improve Outcome: Progressing   Problem: Self-Care: Goal: Ability to participate in self-care as condition permits will improve Outcome: Progressing Goal: Verbalization of feelings and concerns over difficulty with self-care will improve Outcome: Progressing Goal: Ability to communicate needs accurately will improve Outcome: Progressing   Problem: Nutrition: Goal: Risk of aspiration will decrease Outcome: Progressing Goal: Dietary intake will improve Outcome: Progressing   Problem: Intracerebral Hemorrhage Tissue Perfusion: Goal: Complications of Intracerebral Hemorrhage will be minimized Outcome: Progressing   Problem: Ischemic Stroke/TIA Tissue Perfusion: Goal: Complications of ischemic stroke/TIA will be minimized Outcome: Progressing   Problem: Spontaneous Subarachnoid Hemorrhage Tissue Perfusion: Goal: Complications of Spontaneous Subarachnoid Hemorrhage will be minimized Outcome: Progressing

## 2020-02-09 NOTE — Progress Notes (Signed)
Assisted tele visit to patient with daughter.  Alisha Stephenson eLink RN

## 2020-02-09 NOTE — Procedures (Signed)
Cortrak  Person Inserting Tube:  Alisha Stephenson, Alisha Stephenson, RD Tube Type:  Cortrak - 55 inches Tube Location:  Left nare Initial Placement:  Postpyloric Secured by: Clip Technique Used to Measure Tube Placement:  Documented cm marking at nare/ corner of mouth Cortrak Secured At:  92 cm    Cortrak Tube Team Note:  Consult received to place a Cortrak feeding tube.     X-ray is required, abdominal x-ray has been ordered by the Cortrak team. Please confirm tube placement before using the Cortrak tube.   If the tube becomes dislodged please keep the tube and contact the Cortrak team at [email or web address] (password [email or web address]) for replacement.  If after hours and replacement cannot be delayed, place a NG tube and confirm placement with an abdominal x-ray.    Alisha Ina, MS, RD, LDN RD pager number and weekend/on-call pager number located in [place].

## 2020-02-09 NOTE — Progress Notes (Addendum)
Nutrition Follow Up  DOCUMENTATION CODES:   Morbid obesity  INTERVENTION:   Restart tube feeding:  -Pivot 1.5 @ 20 ml/hr via post pyloric Cortrak -Increase by 10 ml Q6 hours to 75 ml/hr (1800 ml) -45 ml Prosource TID  At goal TF provides: 2820 kcals, 202 grams protein, 1350 ml free water. Meets 100% needs.   Continue B complex with Vitamin C to account for losses with CRRT   NUTRITION DIAGNOSIS:   Increased nutrient needs related to acute illness (COVID-19 infection) as evidenced by estimated needs.  Ongoing  GOAL:   Patient will meet greater than or equal to 90% of their needs  Addressed via TF  MONITOR:   Vent status, TF tolerance, Labs, Weight trends  REASON FOR ASSESSMENT:   Ventilator, Consult Enteral/tube feeding initiation and management  ASSESSMENT:   54 y.o. female with medical history of type 2 DM. She presented to the ED at [facility] with SOB diarrhea x1 week, and cough. She was dx with COVID-19 one week prior to presentation to the ED.   8/9- tx from [facility], s/p VV ECMO cannulation 8/13- CT head with multiple areas of [place] 8/16- extubated, gastric Cortrak placed  8/22- start CRRT 8/30- trach  Pt discussed during ICU rounds and with RN.   Trach site bleeding improved. Pulling 50-100 ml/hr on CRRT. Off pressors. Had large vomiting episode this am (~1000 ml). Scheduled regaln started. Cortrak tube advanced post pyloric (tube tip located in 2nd portion duodenum). Discussed with CCM, plan to restart trickles tomorrow and titrate back to goal.   CBGs remain slighty elevated on insulin drip: 202, 200, 238, 135, 200, 215, 179.   Admission weight: 133.6 kg Current weight: 126 kg (down 4 kg since yesterday)  Patient requiring ventilator support via trach MV: 2.5 L/min Temp (24hrs), Avg:98.3 F (36.8 C), Min:97.7 F (36.5 C), Max:99.1 F (37.3 C)   I/O: -8,456 ml since 8/20 UOP: anuric CRRT: 5,381 ml x 24 hrs Stool: 650 ml x 24  hrs  Drips: precedex, insulin Medications: 500 mg Vit C daily, b complex with Vitamin C, colace, tradjenta, 5 mg reglan BID, miralax, senokot, zinc sulfate  Labs: Phosphorus 2.2 (L) Mg 2.8 (H)   Diet Order:   Diet Order            Diet NPO time specified  Diet effective midnight                 EDUCATION NEEDS:   No education needs have been identified at this time  Skin:  Skin Assessment: Skin Integrity Issues: Skin Integrity Issues:: Other (Comment) Other: MASD- breast skin tears-face/chest/chin/buttocks  Last BM:  9/3 via rectal tube  Height:   Ht Readings from Last 1 Encounters:  02/08/20 5\' 4"  (1.626 m)    Weight:   Wt Readings from Last 1 Encounters:  02/09/20 126 kg    Ideal Body Weight:  54.5 kg Adj Body Weight: 84.7 kg   BMI:  Body mass index is 47.68 kg/m.  Estimated Nutritional Needs:   Kcal:  2620-3559 kcal  Protein:  170-210 grams  Fluid:  >/= 2 L/day  Mariana Single RD, LDN Clinical Nutrition Pager listed in [place]

## 2020-02-10 ENCOUNTER — Inpatient Hospital Stay (HOSPITAL_COMMUNITY): Payer: PRIVATE HEALTH INSURANCE

## 2020-02-10 LAB — FIBRINOGEN: Fibrinogen: 604 mg/dL — ABNORMAL HIGH (ref 210–475)

## 2020-02-10 LAB — CBC
HCT: 26.6 % — ABNORMAL LOW (ref 36.0–46.0)
HCT: 27.5 % — ABNORMAL LOW (ref 36.0–46.0)
Hemoglobin: 8 g/dL — ABNORMAL LOW (ref 12.0–15.0)
Hemoglobin: 8.2 g/dL — ABNORMAL LOW (ref 12.0–15.0)
MCH: 32.5 pg (ref 26.0–34.0)
MCH: 32.7 pg (ref 26.0–34.0)
MCHC: 30.1 g/dL (ref 30.0–36.0)
MCHC: 29.8 g/dL — ABNORMAL LOW (ref 30.0–36.0)
MCV: 108.1 fL — ABNORMAL HIGH (ref 80.0–100.0)
MCV: 109.6 fL — ABNORMAL HIGH (ref 80.0–100.0)
Platelets: 105 10*3/uL — ABNORMAL LOW (ref 150–400)
Platelets: 121 10*3/uL — ABNORMAL LOW (ref 150–400)
RBC: 2.46 MIL/uL — ABNORMAL LOW (ref 3.87–5.11)
RBC: 2.51 MIL/uL — ABNORMAL LOW (ref 3.87–5.11)
RDW: 29.6 % — ABNORMAL HIGH (ref 11.5–15.5)
RDW: 29.9 % — ABNORMAL HIGH (ref 11.5–15.5)
WBC: 14.9 10*3/uL — ABNORMAL HIGH (ref 4.0–10.5)
WBC: 21.2 10*3/uL — ABNORMAL HIGH (ref 4.0–10.5)
nRBC: 9.5 % — ABNORMAL HIGH (ref 0.0–0.2)
nRBC: 6.8 % — ABNORMAL HIGH (ref 0.0–0.2)

## 2020-02-10 LAB — GLUCOSE, CAPILLARY
Glucose-Capillary: 130 mg/dL — ABNORMAL HIGH (ref 70–99)
Glucose-Capillary: 139 mg/dL — ABNORMAL HIGH (ref 70–99)
Glucose-Capillary: 140 mg/dL — ABNORMAL HIGH (ref 70–99)
Glucose-Capillary: 141 mg/dL — ABNORMAL HIGH (ref 70–99)
Glucose-Capillary: 143 mg/dL — ABNORMAL HIGH (ref 70–99)
Glucose-Capillary: 144 mg/dL — ABNORMAL HIGH (ref 70–99)
Glucose-Capillary: 150 mg/dL — ABNORMAL HIGH (ref 70–99)
Glucose-Capillary: 154 mg/dL — ABNORMAL HIGH (ref 70–99)
Glucose-Capillary: 168 mg/dL — ABNORMAL HIGH (ref 70–99)
Glucose-Capillary: 171 mg/dL — ABNORMAL HIGH (ref 70–99)
Glucose-Capillary: 173 mg/dL — ABNORMAL HIGH (ref 70–99)
Glucose-Capillary: 173 mg/dL — ABNORMAL HIGH (ref 70–99)
Glucose-Capillary: 174 mg/dL — ABNORMAL HIGH (ref 70–99)
Glucose-Capillary: 174 mg/dL — ABNORMAL HIGH (ref 70–99)
Glucose-Capillary: 178 mg/dL — ABNORMAL HIGH (ref 70–99)
Glucose-Capillary: 187 mg/dL — ABNORMAL HIGH (ref 70–99)
Glucose-Capillary: 189 mg/dL — ABNORMAL HIGH (ref 70–99)
Glucose-Capillary: 194 mg/dL — ABNORMAL HIGH (ref 70–99)
Glucose-Capillary: 194 mg/dL — ABNORMAL HIGH (ref 70–99)
Glucose-Capillary: 211 mg/dL — ABNORMAL HIGH (ref 70–99)

## 2020-02-10 LAB — BPAM RBC
Blood Product Expiration Date: 202109242359
Blood Product Expiration Date: 202109242359
Blood Product Expiration Date: 202109262359
Blood Product Expiration Date: 202109282359
Blood Product Expiration Date: 202110012359
ISSUE DATE / TIME: 202108300821
ISSUE DATE / TIME: 202109020126
Unit Type and Rh: 5100
Unit Type and Rh: 5100
Unit Type and Rh: 5100
Unit Type and Rh: 5100
Unit Type and Rh: 5100

## 2020-02-10 LAB — BASIC METABOLIC PANEL
Anion gap: 11 (ref 5–15)
Anion gap: 10 (ref 5–15)
BUN: 25 mg/dL — ABNORMAL HIGH (ref 6–20)
BUN: 25 mg/dL — ABNORMAL HIGH (ref 6–20)
CO2: 22 mmol/L (ref 22–32)
CO2: 24 mmol/L (ref 22–32)
Calcium: 8.5 mg/dL — ABNORMAL LOW (ref 8.9–10.3)
Calcium: 8.4 mg/dL — ABNORMAL LOW (ref 8.9–10.3)
Chloride: 102 mmol/L (ref 98–111)
Chloride: 104 mmol/L (ref 98–111)
Creatinine, Ser: 1.02 mg/dL — ABNORMAL HIGH (ref 0.44–1.00)
Creatinine, Ser: 1.09 mg/dL — ABNORMAL HIGH (ref 0.44–1.00)
GFR calc Af Amer: >60 mL/min (ref >60)
GFR calc Af Amer: >60 mL/min (ref >60)
GFR calc non Af Amer: >60 mL/min (ref >60)
GFR calc non Af Amer: 57 mL/min — ABNORMAL LOW (ref >60)
Glucose, Bld: 138 mg/dL — ABNORMAL HIGH (ref 70–99)
Glucose, Bld: 186 mg/dL — ABNORMAL HIGH (ref 70–99)
Potassium: 4.2 mmol/L (ref 3.5–5.1)
Potassium: 4.2 mmol/L (ref 3.5–5.1)
Sodium: 135 mmol/L (ref 135–145)
Sodium: 138 mmol/L (ref 135–145)

## 2020-02-10 LAB — TYPE AND SCREEN
ABO/RH(D): O POS
Antibody Screen: NEGATIVE
Unit division: 0
Unit division: 0
Unit division: 0
Unit division: 0
Unit division: 0

## 2020-02-10 LAB — POCT I-STAT 7, (LYTES, BLD GAS, ICA,H+H)
Acid-Base Excess: 0 mmol/L (ref 0.0–2.0)
Acid-Base Excess: 2 mmol/L (ref 0.0–2.0)
Acid-Base Excess: 0 mmol/L (ref 0.0–2.0)
Acid-Base Excess: 2 mmol/L (ref 0.0–2.0)
Bicarbonate: 23.9 mmol/L (ref 20.0–28.0)
Bicarbonate: 26.6 mmol/L (ref 20.0–28.0)
Bicarbonate: 25.8 mmol/L (ref 20.0–28.0)
Bicarbonate: 26.7 mmol/L (ref 20.0–28.0)
Calcium, Ion: 1.14 mmol/L — ABNORMAL LOW (ref 1.15–1.40)
Calcium, Ion: 1.12 mmol/L — ABNORMAL LOW (ref 1.15–1.40)
Calcium, Ion: 1.14 mmol/L — ABNORMAL LOW (ref 1.15–1.40)
Calcium, Ion: 1.13 mmol/L — ABNORMAL LOW (ref 1.15–1.40)
HCT: 25 % — ABNORMAL LOW (ref 36.0–46.0)
HCT: 25 % — ABNORMAL LOW (ref 36.0–46.0)
HCT: 27 % — ABNORMAL LOW (ref 36.0–46.0)
HCT: 26 % — ABNORMAL LOW (ref 36.0–46.0)
Hemoglobin: 8.5 g/dL — ABNORMAL LOW (ref 12.0–15.0)
Hemoglobin: 8.5 g/dL — ABNORMAL LOW (ref 12.0–15.0)
Hemoglobin: 9.2 g/dL — ABNORMAL LOW (ref 12.0–15.0)
Hemoglobin: 8.8 g/dL — ABNORMAL LOW (ref 12.0–15.0)
O2 Saturation: 91 %
O2 Saturation: 88 %
O2 Saturation: 88 %
O2 Saturation: 90 %
Patient temperature: 98.4
Patient temperature: 98
Potassium: 4.3 mmol/L (ref 3.5–5.1)
Potassium: 4.5 mmol/L (ref 3.5–5.1)
Potassium: 4.1 mmol/L (ref 3.5–5.1)
Potassium: 4.3 mmol/L (ref 3.5–5.1)
Sodium: 137 mmol/L (ref 135–145)
Sodium: 138 mmol/L (ref 135–145)
Sodium: 139 mmol/L (ref 135–145)
Sodium: 139 mmol/L (ref 135–145)
TCO2: 25 mmol/L (ref 22–32)
TCO2: 28 mmol/L (ref 22–32)
TCO2: 27 mmol/L (ref 22–32)
TCO2: 28 mmol/L (ref 22–32)
pCO2 arterial: 33.6 mmHg (ref 32.0–48.0)
pCO2 arterial: 40 mmHg (ref 32.0–48.0)
pCO2 arterial: 45.9 mmHg (ref 32.0–48.0)
pCO2 arterial: 41.8 mmHg (ref 32.0–48.0)
pH, Arterial: 7.461 — ABNORMAL HIGH (ref 7.350–7.450)
pH, Arterial: 7.431 (ref 7.350–7.450)
pH, Arterial: 7.359 (ref 7.350–7.450)
pH, Arterial: 7.412 (ref 7.350–7.450)
pO2, Arterial: 56 mmHg — ABNORMAL LOW (ref 83.0–108.0)
pO2, Arterial: 53 mmHg — ABNORMAL LOW (ref 83.0–108.0)
pO2, Arterial: 58 mmHg — ABNORMAL LOW (ref 83.0–108.0)
pO2, Arterial: 57 mmHg — ABNORMAL LOW (ref 83.0–108.0)

## 2020-02-10 LAB — HEPARIN LEVEL (UNFRACTIONATED): Heparin Unfractionated: <0.1 IU/mL — ABNORMAL LOW (ref 0.30–0.70)

## 2020-02-10 LAB — LACTATE DEHYDROGENASE: LDH: 450 U/L — ABNORMAL HIGH (ref 98–192)

## 2020-02-10 LAB — APTT
aPTT: 38 seconds — ABNORMAL HIGH (ref 24–36)
aPTT: 38 seconds — ABNORMAL HIGH (ref 24–36)

## 2020-02-10 LAB — LACTIC ACID, PLASMA
Lactic Acid, Venous: 1.7 mmol/L (ref 0.5–1.9)
Lactic Acid, Venous: 1.7 mmol/L (ref 0.5–1.9)

## 2020-02-10 LAB — MAGNESIUM: Magnesium: 2.7 mg/dL — ABNORMAL HIGH (ref 1.7–2.4)

## 2020-02-10 LAB — PHOSPHORUS: Phosphorus: 2.5 mg/dL (ref 2.5–4.6)

## 2020-02-10 NOTE — Progress Notes (Signed)
ANTICOAGULATION CONSULT NOTE - [place] for Heparin Indication: ECMO  Allergies  Allergen Reactions  . Sulfa Antibiotics Rash and Other (See Comments)    Patient Measurements: Height: 5\' 4"  (162.6 cm) Weight: 124.9 kg (275 lb 5.7 oz) IBW/kg (Calculated) : 54.7 Heparin Dosing Weight: 87 kg  Vital Signs: Temp: 98.4 F (36.9 C) (09/04 0324) Temp Source: Oral (09/03 2000) BP: 73/47 (09/04 0324) Pulse Rate: 104 (09/04 0700)  Labs: Recent Labs    02/08/20 0415 02/08/20 0513 02/08/20 1420 02/08/20 1628 02/09/20 0341 02/09/20 0352 02/09/20 1604 02/09/20 1611 02/09/20 2012 02/09/20 2012 02/10/20 0322 02/10/20 0353  HGB  --    < >  --    < > 8.7*   < > 8.6*   < > 8.8*   < > 8.0* 8.5*  HCT  --    < >  --    < > 28.0*   < > 28.5*   < > 26.0*  --  26.6* 25.0*  PLT  --   --   --    < > 111*  --  104*  --   --   --  105*  --   APTT  --    < > 49*   < > 50*  --  50*  --   --   --  38*  --   HEPARINUNFRC <0.10*  --  <0.10*  --  <0.10*  --   --   --   --   --   --   --   CREATININE  --   --   --    < > 0.94  --  1.02*  --   --   --  1.02*  --    < > = values in this interval not displayed.    Estimated Creatinine Clearance: 81.5 mL/min (A) (by C-G formula based on SCr of 1.02 mg/dL (H)).   Medical History: Past Medical History:  Diagnosis Date  . Asthma   . Diabetes mellitus without complication ([place])   . Diverticulitis   . Gallstones   . IBS (irritable bowel syndrome)   . NAFLD (nonalcoholic fatty liver disease)    CT scan 2015  . Ovarian cyst     Assessment: 54 yo female who is COVID+ on VV ECMO. Patient was started on bivalirudin but was found to have multiple ICH on 8/13 head CT. Bivalirudin was stopped at this time and pt has been off anticoagulation. Her ECMO circuit was changed 8/24, and pharmacy asked to start low-fixed rate heparin.  Heparin infusion was held earlier this week due to bleeding from trach site. Hgb 8, plts increased to  105 this morning. Fibrinogen 609, LDH 450. ECMO circuit is stable - mild clots in circuit corners.   Heparin at 500 units/hr with plans to not titrate for now. Heparin level remains undetectable, aPTT = 38  Goal of Therapy:  Heparin level 0.2-0.5 units/ml - currently not titrating to goal per discussion with ECMO team Monitor platelets by anticoagulation protocol: Yes   Plan:  Continue Heparin at 500 units/hr  Monitor bleeding around trach site closely Monitor daily CBC, LDH, fibrinogen, s/sx of bleeding  Richardine Service, PharmD PGY2 Cardiology Pharmacy Resident Phone: [phone] 02/10/2020  11:55 AM  Please check [email or web address] for unit-specific pharmacy phone numbers.

## 2020-02-10 NOTE — Progress Notes (Signed)
[facility] ROUNDING NOTE   Subjective:   This is a 54 year old lady with history of diabetes obesity presented with acute hypoxic respiratory failure due to COVID-19 infection.  Hospitalization has been complicated by severe shock refractory hypoxia requiring ECMO and multiple infections, intracranial hemorrhage and encephalopathy.  Baseline creatinine appears to be about 0.7 mg/dL.  She developed acute shock secondary to acute tubular necrosis and started on CRRT 01/28/2020.  Status post percutaneous tracheostomy with bronchoscopic guidance 02/05/2020  Blood pressure 124/46 pulse 104 temperature 98.4 O2 sats 83% FiO2 40%.  Slight negative balance removing 50 -  100 cc an hour.  Anuric  IV norepinephrine IV heparin IV amiodarone   Sodium 137 potassium 4.3 chloride 102 CO2 22 BUN 25 creatinine 1.02 glucose 138 phosphorus 2.5 magnesium 2.7 hemoglobin 8.5  Protonix 40 mg daily, oxycodone 10 mg every 6 hours, Tradjenta 5 mg daily, Reglan 5 mg every 12 hours midodrine 5 mg 3 times daily, quetiapine 50 mg nightly  Eraxis 200 mg daily, IV Zosyn 4.5 g every 6 hours  Objective:  Vital signs in last 24 hours:  Temp:  [97.9 F (36.6 C)-98.4 F (36.9 C)] 98.4 F (36.9 C) (09/04 0324) Pulse Rate:  [85-107] 102 (09/04 0600) Resp:  [15-22] 15 (09/04 0324) BP: (73-145)/(45-55) 73/47 (09/04 0324) SpO2:  [83 %-93 %] 88 % (09/04 0600) Arterial Line BP: (93-156)/(42-66) 133/45 (09/04 0600) FiO2 (%):  [40 %] 40 % (09/04 0324)  Weight change:  Filed Weights   02/07/20 0411 02/08/20 0530 02/09/20 0500  Weight: 121 kg 130.6 kg 126 kg    Intake/Output: I/O last 3 completed shifts: In: 5475.4 [I.V.:1793.8; GM/WN:0272.5; IV Piggyback:1234.1] Out: 8554 [Emesis/NG output:600; DGUYQ:0347; Stool:750]   Intake/Output this shift:  Total I/O In: 317.5 [I.V.:186; IV Piggyback:131.5] Out: 1387 [Other:1387]  General: Critically ill female, has ECMO running.  Not much  improvement. Heart:RRR, s1s2 nl Lungs: Coarse breath sound bilateral Abdomen:soft, non-distended Extremities: Anasarca and edema improving. Dialysis Access: Temporary HD catheter   Basic Metabolic Panel: Recent Labs  Lab 02/07/20 0305 02/07/20 0326 02/08/20 0414 02/08/20 0513 02/08/20 1628 02/08/20 2303 02/09/20 0341 02/09/20 0352 02/09/20 1604 02/09/20 1604 02/09/20 1611 02/09/20 1658 02/09/20 2012 02/10/20 0322 02/10/20 0353  NA 137   < > 139   < > 138   < > 137   < > 136   < > 140 139 138 135 137  K 3.7   < > 4.0   < > 4.5   < > 4.1   < > 4.3   < > 4.2 4.9 4.4 4.2 4.3  CL 101   < > 104  --  102  --  102  --  102  --   --   --   --  102  --   CO2 22   < > 24  --  25  --  24  --  22  --   --   --   --  22  --   GLUCOSE 180*   < > 135*  --  129*  --  215*  --  162*  --   --   --   --  138*  --   BUN 32*   < > 34*  --  33*  --  33*  --  30*  --   --   --   --  25*  --   CREATININE 0.79   < > 0.80  --  0.87  --  0.94  --  1.02*  --   --   --   --  1.02*  --   CALCIUM 8.0*   < > 8.0*   < > 8.1*  --  8.1*  --  8.4*  --   --   --   --  8.5*  --   MG 2.5*  --  2.4  --   --   --  2.8*  --  2.7*  --   --   --   --  2.7*  --   PHOS 3.0  --  3.1  --   --   --  2.2*  --  4.4  --   --   --   --  2.5  --    < > = values in this interval not displayed.    Liver Function Tests: Recent Labs  Lab 02/04/20 0404 02/05/20 0304  AST 105* 73*  ALT 158* 151*  ALKPHOS 243* 264*  BILITOT 1.8* 1.6*  PROT 5.7* 5.7*  ALBUMIN 2.2* 2.1*   No results for input(s): LIPASE, AMYLASE in the last 168 hours. No results for input(s): AMMONIA in the last 168 hours.  CBC: Recent Labs  Lab 02/07/20 0311 02/07/20 0326 02/08/20 0414 02/08/20 0513 02/08/20 1628 02/08/20 2303 02/09/20 0341 02/09/20 0352 02/09/20 1604 02/09/20 1604 02/09/20 1611 02/09/20 1658 02/09/20 2012 02/10/20 0322 02/10/20 0353  WBC 13.6*   < > 14.2*  --  17.7*  --  16.4*  --  17.5*  --   --   --   --  14.9*  --    NEUTROABS 10.3*  --   --   --   --   --   --   --   --   --   --   --   --   --   --   HGB 8.7*   < > 8.7*   < > 8.9*   < > 8.7*   < > 8.6*   < > 9.5* 9.2* 8.8* 8.0* 8.5*  HCT 27.7*   < > 28.2*   < > 28.6*   < > 28.0*   < > 28.5*   < > 28.0* 27.0* 26.0* 26.6* 25.0*  MCV 102.2*   < > 102.9*  --  105.5*  --  105.7*  --  107.1*  --   --   --   --  108.1*  --   PLT 69*   < > 99*  --  104*  --  111*  --  104*  --   --   --   --  105*  --    < > = values in this interval not displayed.    Cardiac Enzymes: No results for input(s): CKTOTAL, CKMB, CKMBINDEX, TROPONINI in the last 168 hours.  BNP: Invalid input(s): POCBNP  CBG: Recent Labs  Lab 02/10/20 0109 02/10/20 0212 02/10/20 0356 02/10/20 0555 02/10/20 0634  GLUCAP 143* 140* 139* 141* 150*    Microbiology: Results for orders placed or performed during the hospital encounter of 01/23/2020  Blood Culture (routine x 2)     Status: None   Collection Time: 01/23/2020 10:20 AM   Specimen: BLOOD  Result Value Ref Range Status   Specimen Description   Final    BLOOD RIGHT ANTECUBITAL Performed at [facility], [address], [place], [place] [address]    Special Requests   Final    BOTTLES DRAWN  AEROBIC AND ANAEROBIC Blood Culture adequate volume Performed at [facility], [address] [place], [place] [address]    Culture   Final    NO GROWTH 5 DAYS Performed at [facility], [place] [address], [place], [place] [address]    Report Status 01/13/2020 FINAL  Final  Blood Culture (routine x 2)     Status: None   Collection Time: 02/03/2020 11:19 AM   Specimen: BLOOD  Result Value Ref Range Status   Specimen Description   Final    BLOOD BLOOD RIGHT FOREARM Performed at [facility], [address] [place], [place] [address]    Special Requests   Final    BOTTLES DRAWN AEROBIC ONLY Blood Culture results may not be optimal due to an inadequate volume of blood received in culture  bottles Performed at [facility], [address] [place], [place] [address]    Culture   Final    NO GROWTH 5 DAYS Performed at [facility], [place] [address] [place], [place] [address]    Report Status 01/13/2020 FINAL  Final  SARS Coronavirus 2 by RT PCR (hospital order, performed in [facility] hospital lab) Nasopharyngeal Nasopharyngeal Swab     Status: Abnormal   Collection Time: 01/09/2020 11:19 AM   Specimen: Nasopharyngeal Swab  Result Value Ref Range Status   SARS Coronavirus 2 POSITIVE (A) NEGATIVE Final    Comment: RESULT CALLED TO, READ BACK BY AND VERIFIED WITH:  SIMMS MARVA, RN @ 0938 ON 01/10/2020, CABELLERO.P (NOTE) SARS-CoV-2 target nucleic acids are DETECTED  SARS-CoV-2 RNA is generally detectable in upper respiratory specimens  during the acute phase of infection.  Positive results are indicative  of the presence of the identified virus, but do not rule out bacterial infection or co-infection with other pathogens not detected by the test.  Clinical correlation with patient history and  other diagnostic information is necessary to determine patient infection status.  The expected result is negative.  Fact Sheet for Patients:   StrictlyIdeas.no   Fact Sheet for Healthcare Providers:   BankingDealers.co.za    This test is not yet approved or cleared by the [place] FDA and  has been authorized for detection and/or diagnosis of SARS-CoV-2 by FDA under an Emergency Use Authorization (EUA).  This EUA will remain in effect  (meaning this test can be used) for the duration of  the COVID-19 declaration under Section 564(b)(1) of the Act, 21 U.S.C. section 360-bbb-3(b)(1), unless the authorization is terminated or revoked sooner.  Performed at [facility], [address] [place], [place] [address]   Culture, respiratory (non-expectorated)     Status: None   Collection Time: 01/10/20   8:32 AM   Specimen: Tracheal Aspirate; Respiratory  Result Value Ref Range Status   Specimen Description   Final    TRACHEAL ASPIRATE Performed at [place] [address], [place], [place] [address]    Special Requests   Final    NONE Performed at [facility], [place] [address] [place], [place] [address]    Gram Stain NO WBC SEEN NO ORGANISMS SEEN   Final   Culture   Final    FEW Consistent with normal respiratory flora. No Pseudomonas species isolated Performed at [place] [address], , [place] [address]    Report Status 01/12/2020 FINAL  Final  MRSA PCR Screening  Status: None   Collection Time: 01/10/20  6:11 PM   Specimen: Nasal Mucosa; Nasopharyngeal  Result Value Ref Range Status   MRSA by PCR NEGATIVE NEGATIVE Final    Comment:        The GeneXpert MRSA Assay (FDA approved for NASAL specimens only), is one component of a comprehensive MRSA colonization surveillance program. It is not intended to diagnose MRSA infection nor to guide or monitor treatment for MRSA infections. Performed at [facility], [place] [address], [place], [place] [address]   Culture, Urine     Status: None   Collection Time: 01/10/20  6:11 PM   Specimen: Urine, Random  Result Value Ref Range Status   Specimen Description   Final    URINE, RANDOM Performed at [place] [address], [place], [place] [address]    Special Requests   Final    NONE Performed at [facility], [place] [address] [place], [place] [address]    Culture   Final    NO GROWTH Performed at [facility], [place] [address] [place], [place] [address]    Report Status 01/11/2020 FINAL  Final  Culture, respiratory (non-expectorated)     Status: None   Collection Time: 01/18/20  8:44 AM   Specimen: Tracheal Aspirate; Respiratory  Result Value Ref Range Status   Specimen Description TRACHEAL  ASPIRATE  Final   Special Requests NONE  Final   Gram Stain   Final    NO WBC SEEN FEW GRAM POSITIVE COCCI IN PAIRS IN CLUSTERS Performed at [facility], [address], [place], [place] [address]    Culture ABUNDANT STAPHYLOCOCCUS AUREUS  Final   Report Status 01/20/2020 FINAL  Final   Organism ID, Bacteria STAPHYLOCOCCUS AUREUS  Final      Susceptibility   Staphylococcus aureus - MIC*    CIPROFLOXACIN <=0.5 SENSITIVE Sensitive     ERYTHROMYCIN >=8 RESISTANT Resistant     GENTAMICIN <=0.5 SENSITIVE Sensitive     OXACILLIN <=0.25 SENSITIVE Sensitive     TETRACYCLINE <=1 SENSITIVE Sensitive     VANCOMYCIN <=0.5 SENSITIVE Sensitive     TRIMETH/SULFA <=10 SENSITIVE Sensitive     CLINDAMYCIN RESISTANT Resistant     RIFAMPIN <=0.5 SENSITIVE Sensitive     Inducible Clindamycin POSITIVE Resistant     * ABUNDANT STAPHYLOCOCCUS AUREUS  Culture, blood (Routine X 2) w Reflex to ID Panel     Status: Abnormal   Collection Time: 01/18/20 10:17 AM   Specimen: BLOOD  Result Value Ref Range Status   Specimen Description BLOOD LEFT ANTECUBITAL  Final   Special Requests   Final    BOTTLES DRAWN AEROBIC ONLY Blood Culture adequate volume   Culture  Setup Time   Final    AEROBIC BOTTLE ONLY GRAM POSITIVE COCCI Organism ID to follow CRITICAL RESULT CALLED TO, READ BACK BY AND VERIFIED WITH: Denton Brick [facility] 01/19/20 0044 JDW Performed at [facility], [address] [place], [place] [address]    Culture ENTEROCOCCUS FAECALIS (A)  Final   Report Status 01/20/2020 FINAL  Final   Organism ID, Bacteria ENTEROCOCCUS FAECALIS  Final      Susceptibility   Enterococcus faecalis - MIC*    AMPICILLIN <=2 SENSITIVE Sensitive     VANCOMYCIN 1 SENSITIVE Sensitive     GENTAMICIN SYNERGY SENSITIVE Sensitive     * ENTEROCOCCUS FAECALIS  Blood Culture ID Panel (Reflexed)     Status: Abnormal  Collection Time: 01/18/20 10:17 AM  Result Value Ref Range Status   Enterococcus faecalis DETECTED (A)  NOT DETECTED Final    Comment: CRITICAL RESULT CALLED TO, READ BACK BY AND VERIFIED WITH: G ABBOTT PHARMD 01/19/20 0044 JDW    Enterococcus Faecium NOT DETECTED NOT DETECTED Final   Listeria monocytogenes NOT DETECTED NOT DETECTED Final   Staphylococcus species NOT DETECTED NOT DETECTED Final   Staphylococcus aureus (BCID) NOT DETECTED NOT DETECTED Final   Staphylococcus epidermidis NOT DETECTED NOT DETECTED Final   Staphylococcus lugdunensis NOT DETECTED NOT DETECTED Final   Streptococcus species NOT DETECTED NOT DETECTED Final   Streptococcus agalactiae NOT DETECTED NOT DETECTED Final   Streptococcus pneumoniae NOT DETECTED NOT DETECTED Final   Streptococcus pyogenes NOT DETECTED NOT DETECTED Final   A.calcoaceticus-baumannii NOT DETECTED NOT DETECTED Final   Bacteroides fragilis NOT DETECTED NOT DETECTED Final   Enterobacterales NOT DETECTED NOT DETECTED Final   Enterobacter cloacae complex NOT DETECTED NOT DETECTED Final   Escherichia coli NOT DETECTED NOT DETECTED Final   Klebsiella aerogenes NOT DETECTED NOT DETECTED Final   Klebsiella oxytoca NOT DETECTED NOT DETECTED Final   Klebsiella pneumoniae NOT DETECTED NOT DETECTED Final   Proteus species NOT DETECTED NOT DETECTED Final   Salmonella species NOT DETECTED NOT DETECTED Final   Serratia marcescens NOT DETECTED NOT DETECTED Final   Haemophilus influenzae NOT DETECTED NOT DETECTED Final   Neisseria meningitidis NOT DETECTED NOT DETECTED Final   Pseudomonas aeruginosa NOT DETECTED NOT DETECTED Final   Stenotrophomonas maltophilia NOT DETECTED NOT DETECTED Final   Candida albicans NOT DETECTED NOT DETECTED Final   Candida auris NOT DETECTED NOT DETECTED Final   Candida glabrata NOT DETECTED NOT DETECTED Final   Candida krusei NOT DETECTED NOT DETECTED Final   Candida parapsilosis NOT DETECTED NOT DETECTED Final   Candida tropicalis NOT DETECTED NOT DETECTED Final   Cryptococcus neoformans/gattii NOT DETECTED NOT DETECTED  Final   Vancomycin resistance NOT DETECTED NOT DETECTED Final    Comment: Performed at [facility], [address], [place], [place] [address]  Culture, blood (Routine X 2) w Reflex to ID Panel     Status: Abnormal   Collection Time: 01/18/20 10:30 AM   Specimen: BLOOD LEFT ARM  Result Value Ref Range Status   Specimen Description BLOOD LEFT ARM  Final   Special Requests   Final    BOTTLES DRAWN AEROBIC ONLY Blood Culture adequate volume   Culture  Setup Time   Final    AEROBIC BOTTLE ONLY GRAM POSITIVE COCCI CRITICAL VALUE NOTED.  VALUE IS CONSISTENT WITH PREVIOUSLY REPORTED AND CALLED VALUE.    Culture (A)  Final    ENTEROCOCCUS FAECALIS SUSCEPTIBILITIES PERFORMED ON PREVIOUS CULTURE WITHIN THE LAST 5 DAYS. Performed at [facility], [place] [address] [place], [place] [address]    Report Status 01/20/2020 FINAL  Final  Culture, blood (routine x 2)     Status: None   Collection Time: 01/21/20  6:07 PM   Specimen: BLOOD LEFT HAND  Result Value Ref Range Status   Specimen Description BLOOD LEFT HAND  Final   Special Requests   Final    BOTTLES DRAWN AEROBIC AND ANAEROBIC Blood Culture adequate volume   Culture   Final    NO GROWTH 5 DAYS Performed at [facility], [place] [address] [place],  [address]    Report Status 01/26/2020 FINAL  Final  Culture, blood (routine x 2)  Status: None   Collection Time: 01/21/20  6:14 PM   Specimen: BLOOD LEFT FOREARM  Result Value Ref Range Status   Specimen Description BLOOD LEFT FOREARM  Final   Special Requests   Final    BOTTLES DRAWN AEROBIC ONLY Blood Culture adequate volume   Culture   Final    NO GROWTH 5 DAYS Performed at [facility], [address], [place], [place] [address]    Report Status 01/26/2020 FINAL  Final  Culture, blood (Routine X 2) w Reflex to ID Panel     Status: None   Collection Time: 01/25/20  1:15 PM   Specimen: BLOOD  Result Value Ref Range Status   Specimen Description  BLOOD LEFT ANTECUBITAL  Final   Special Requests   Final    BOTTLES DRAWN AEROBIC AND ANAEROBIC Blood Culture adequate volume   Culture   Final    NO GROWTH 5 DAYS Performed at [facility], [place] [address] [place], [place] [address]    Report Status 01/30/2020 FINAL  Final  Culture, blood (Routine X 2) w Reflex to ID Panel     Status: None   Collection Time: 01/25/20  1:23 PM   Specimen: BLOOD LEFT FOREARM  Result Value Ref Range Status   Specimen Description BLOOD LEFT FOREARM  Final   Special Requests   Final    BOTTLES DRAWN AEROBIC AND ANAEROBIC Blood Culture adequate volume   Culture   Final    NO GROWTH 5 DAYS Performed at [facility], [place] [address], [place], [place] [address]    Report Status 01/30/2020 FINAL  Final  SARS Coronavirus 2 by RT PCR (hospital order, performed in [facility] hospital lab) Nasopharyngeal     Status: None   Collection Time: 01/29/20  9:00 AM   Specimen: Nasopharyngeal  Result Value Ref Range Status   SARS Coronavirus 2 NEGATIVE NEGATIVE Final    Comment: (NOTE) SARS-CoV-2 target nucleic acids are NOT DETECTED.  The SARS-CoV-2 RNA is generally detectable in upper and lower respiratory specimens during the acute phase of infection. The lowest concentration of SARS-CoV-2 viral copies this assay can detect is 250 copies / mL. A negative result does not preclude SARS-CoV-2 infection and should not be used as the sole basis for treatment or other patient management decisions.  A negative result may occur with improper specimen collection / handling, submission of specimen other than nasopharyngeal swab, presence of viral mutation(s) within the areas targeted by this assay, and inadequate number of viral copies (<250 copies / mL). A negative result must be combined with clinical observations, patient history, and epidemiological information.  Fact Sheet for Patients:   StrictlyIdeas.no  Fact Sheet for  Healthcare Providers: BankingDealers.co.za  This test is not yet approved or  cleared by the [place] FDA and has been authorized for detection and/or diagnosis of SARS-CoV-2 by FDA under an Emergency Use Authorization (EUA).  This EUA will remain in effect (meaning this test can be used) for the duration of the COVID-19 declaration under Section 564(b)(1) of the Act, 21 U.S.C. section 360bbb-3(b)(1), unless the authorization is terminated or revoked sooner.  Performed at [facility], [place] [address], [place], [place] [address]   Culture, bal-quantitative     Status: Abnormal   Collection Time: 01/30/20 10:36 AM   Specimen: Bronchial Alveolar Lavage; Respiratory  Result Value Ref Range Status   Specimen Description Bronch [place]  Final   Special Requests NONE  Final   Gram Stain   Final    ABUNDANT WBC PRESENT,BOTH PMN AND MONONUCLEAR NO ORGANISMS SEEN Performed at [facility], [place] [address], [place], [place] [address]    Culture 10,000 COLONIES/mL CANDIDA DUBLINIENSIS (A)  Final   Report Status 02/02/2020 FINAL  Final  Culture, blood (routine x 2)     Status: None   Collection Time: 01/31/20  5:36 PM   Specimen: BLOOD RIGHT HAND  Result Value Ref Range Status   Specimen Description BLOOD RIGHT HAND  Final   Special Requests   Final    BOTTLES DRAWN AEROBIC ONLY Blood Culture results may not be optimal due to an inadequate volume of blood received in culture bottles   Culture   Final    NO GROWTH 5 DAYS Performed at [facility], [place] [address], [place], [place] [address]    Report Status 02/05/2020 FINAL  Final  Culture, blood (routine x 2)     Status: None   Collection Time: 01/31/20  5:50 PM   Specimen: BLOOD RIGHT HAND  Result Value Ref Range Status   Specimen Description BLOOD RIGHT HAND  Final   Special Requests   Final    BOTTLES DRAWN AEROBIC ONLY Blood Culture results may not be optimal due to an inadequate volume of  blood received in culture bottles   Culture   Final    NO GROWTH 5 DAYS Performed at [facility], [place] [address], [place], [place] [address]    Report Status 02/05/2020 FINAL  Final    Coagulation Studies: No results for input(s): LABPROT, INR in the last 72 hours.  Urinalysis: No results for input(s): COLORURINE, LABSPEC, PHURINE, GLUCOSEU, HGBUR, BILIRUBINUR, KETONESUR, PROTEINUR, UROBILINOGEN, NITRITE, LEUKOCYTESUR in the last 72 hours.  Invalid input(s): APPERANCEUR    Imaging: DG Abd 1 View  Result Date: 02/09/2020 CLINICAL DATA:  NG placement EXAM: ABDOMEN - 1 VIEW COMPARISON:  Chest 02/09/2020 FINDINGS: Feeding tube enters the stomach with the tip in the region of the antrum or duodenal bulb. Normal bowel gas pattern Large bore vascular catheter in the right atrium/distal IVC unchanged from chest x-ray. IMPRESSION: Feeding tube tip in the region the gastric antrum or duodenal bulb. Normal bowel gas pattern. Electronically Signed   By: Franchot Gallo M.D.   On: 02/09/2020 12:08   DG CHEST PORT 1 VIEW  Result Date: 02/09/2020 CLINICAL DATA:  COVID positive.  Respiratory failure.  ECMO. EXAM: PORTABLE CHEST 1 VIEW COMPARISON:  Multiple recent previous exams. FINDINGS: Confluent bilateral airspace opacity is again noted and appears progressive in left upper lobe. Tracheostomy tube again noted in situ. Right PICC line tip is difficult to visualize but is probably in the right subclavian vein but may be obscured by the ECMO catheter. Left IJ central line tip is positioned over the innominate vein confluence. There is a multi lumen left subclavian catheter with the tip overlying the medial aspect of the left subclavian vein. IMPRESSION: 1. Persistent diffuse bilateral airspace disease with interval progression in the left upper lobe. 2. Stable position of support tubing. Electronically Signed   By: Misty Stanley M.D.   On: 02/09/2020 07:21   DG Abd Portable 1V  Result Date:  02/09/2020 CLINICAL DATA:  Feeding tube placement. EXAM: PORTABLE ABDOMEN - 1 VIEW COMPARISON:  02/09/2020 FINDINGS: Feeding tube is been advanced into the duodenum with the tip in the second portion of duodenum. Central venous large-bore catheter extending into the distal IVC  unchanged. Severe airspace disease bilaterally. No dilated bowel loops. IMPRESSION: Feeding tube has been advanced with the tip in the second portion the duodenum. Electronically Signed   By: Franchot Gallo M.D.   On: 02/09/2020 14:43     Medications:   .  prismasol BGK 4/2.5 300 mL/hr at 02/09/20 2042  . sodium chloride 250 mL (02/09/20 1130)  . sodium chloride 10 mL/hr at 02/10/20 0600  . albumin human 12.5 g (02/10/20 0343)  . albumin human Stopped (02/06/20 2000)  . dexmedetomidine (PRECEDEX) IV infusion Stopped (02/09/20 0616)  . feeding supplement (PIVOT 1.5 CAL) 75 mL/hr at 02/08/20 2015  . heparin 500 Units/hr (02/10/20 0600)  . insulin 1.7 mL/hr at 02/10/20 0600  . milrinone Stopped (02/05/20 0905)  . norepinephrine (LEVOPHED) Adult infusion Stopped (02/09/20 0728)  . piperacillin-tazobactam (ZOSYN)  IV 200 mL/hr at 02/10/20 0600  . prismasol BGK 2/2.5 replacement solution 500 mL/hr at 02/10/20 0111  . prismasol BGK 4/2.5 2,000 mL/hr at 02/10/20 0109  . vasopressin Stopped (02/06/20 1741)   . sodium chloride   Intravenous Once  . sodium chloride   Intravenous Once  . amiodarone  200 mg Per Tube Daily  . vitamin C  500 mg Per Tube Daily  . B-complex with vitamin C  1 tablet Oral Daily  . chlorhexidine gluconate (MEDLINE KIT)  15 mL Mouth Rinse BID  . Chlorhexidine Gluconate Cloth  6 each Topical Daily  . clonazePAM  1 mg Per Tube Q6H  . docusate  100 mg Per Tube BID  . feeding supplement (PROSource TF)  45 mL Per Tube TID  . fentaNYL (SUBLIMAZE) injection  50 mcg Intravenous Once  . linagliptin  5 mg Per Tube Daily  . mouth rinse  15 mL Mouth Rinse 10 times per day  . melatonin  9 mg Per Tube QHS  .  metoCLOPramide (REGLAN) injection  5 mg Intravenous Q12H  . midodrine  5 mg Per Tube TID WC  . oxyCODONE  10 mg Per Tube Q6H  . pantoprazole (PROTONIX) IV  40 mg Intravenous BID  . polyethylene glycol  17 g Per Tube Daily  . QUEtiapine  50 mg Per Tube QHS  . sennosides  5 mL Per Tube BID  . zinc sulfate  220 mg Per Tube Daily   sodium chloride, sodium chloride, acetaminophen (TYLENOL) oral liquid 160 mg/5 mL, albumin human, albuterol, dextrose, fentaNYL, fentaNYL (SUBLIMAZE) injection, guaiFENesin-dextromethorphan, heparin, hydrALAZINE, HYDROmorphone (DILAUDID) injection, hydrOXYzine, ondansetron **OR** ondansetron (ZOFRAN) IV, sodium chloride  Assessment/ Plan:  1.  Anuric AKI due to shock, sepsis causing acute tubular necrosis: Started CRRT on 8/22 for uremia and volume overload. She continues on ECMO. Continue CRRT, prognosis grim.  Now removing about 50 cc-100 an hour.  May be able to tolerate intermittent hemodialysis after the weekend.  We will continue fluid removal with CRRT during weekend.  If tolerated  2.Shock due to sepsis, Covid infection, Enterococcus bacteremia: On pressors, antibiotics per primary team.  3.Acute hypoxic and hypercapnic respiratory failure, ARDS due to COVID-19 pneumonia: Currently on ECMO for refractory hypoxemia, MSSA pneumonia.  On broad-spectrum antibiotics.  IV Zosyn and IV Eraxis appreciate assistance from infectious disease.  Status post tracheostomy 04/06/2020  3.Hypernatremia, hypervolemia: relative free water deficit.  Improved with CRRT.  4.Acute toxic metabolic encephalopathy:Multifactorial with uremia contributing. Treatment with CRRT as above.  5.Anemia, thrombocytopenia: With hemolysis likely and acute illness contributing. Transfusions per primary team.  Seen by hematologist.  6.Intracranial hemorrhage: With some left-sided weakness preceded  by primary team.   7. GOC: Multiorgan failure.   family meeting on 8/25, continue full  scope of treatment.  Long-term prognosis poor.  8.  Diabetes as per primary service continues on oral Tradjenta.  9.  Hypophosphatemia improved    LOS: [place] _0 _1 :46 AM

## 2020-02-10 NOTE — Progress Notes (Signed)
NAME:  Alisha Stephenson, MRN:  [id number], DOB:  [birth year], LOS: 60 ADMISSION DATE:  01/29/2020, CONSULTATION DATE:  8/3 REFERRING MD:  Alfredia Ferguson, CHIEF COMPLAINT:  Dyspnea   Brief History   54 y/o female admitted on 8/2 with severe acute respiratory failure with hypoxemia due to COVID 19 pneumonia.  She developed symptoms 1 week prior to admission.  Past Medical History  DM2 Diverticulitis Gallstones Ovarian cyst NAFLD Asthma  Significant Hospital Events   8/2 admit 8/3 ICU transfer, intubated 8/4 prone, paralyze 8/9 significant desaturations today 8/9 VV ECMO cannulation 8/13 ICH 8/30 trach  Consults:  PCCM ECMO team  Procedures:  8/3 ETT > 8/30 8/30 8-0 shiley trach 8/3 PICC >  8/9 LIJ MML 8/9 RIJ [place] 69F   Significant Diagnostic Tests:  7/31 CT head > NAICP 7/31 MRI/MRA brain > no acute changes, possibly small aneurysm ACOM 8/13 CT head> multiple areas of ICH  Micro Data:  8/2 blood > NG 8/2 SARS COV 2 > positive 8/4 resp > negative 8/4 urine >  8/12 blood > E. faecalis (pan-sensitive) 8/12 resp: staph aureus> MSSA 8/25 blood>> NG 8/24 BAL> yeast  Antimicrobials:  See fever tab for past abx Current Eraxis 8/26>> Zosyn 8/23>>  Interim history/subjective:  Vomiting this morning.   Objective   Blood pressure (!) 128/46, pulse 96, temperature 98.7 F (37.1 C), temperature source Oral, resp. rate (!) 29, height _0  (1.626 m), weight 124.9 kg, SpO2 (!) 80 %.    Vent Mode: PCV FiO2 (%):  [40 %] 40 % Set Rate:  [15 bmp] 15 bmp PEEP:  [8 cmH20] 8 cmH20 Plateau Pressure:  [24 cmH20-31 cmH20] 31 cmH20   Intake/Output Summary (Last 24 hours) at 02/10/2020 1736 Last data filed at 02/10/2020 1717 Gross per 24 hour  Intake 1522.29 ml  Output 3192 ml  Net -1669.71 ml   Filed Weights   02/08/20 0530 02/09/20 0500 02/10/20 0500  Weight: 130.6 kg 126 kg 124.9 kg    Examination:  GEN: ill appearing woman laying in bed in NAD HEENT:  eyes anicteric,  [place]/AT Neck: Trach in place, no active bleeding CV: Regular rate and rhythm PULM: Rales bilaterally, tidal volumes 160 cc GI: obese, soft, NT, ND EXT: Peripheral edema.  Blistering wounds on right digits.  Continued swelling and bruising on left hand and digits. NEURO: Tracks, nods to answer questions.  Globally very weak.  Attempting to wiggle her toes, but not moving her upper extremities at all. PSYCH: RASS = 0 SKIN: Bruising over her left hand, bruise on her left forearm.  Pallor.  No rashes.  Plts improved, greater than 100 Sugars labile LDH 1016>>756>>491> 484> 477 Hgb stable 8.7 ABG pO2 50 Vent PC 12/12 rate 15 Lactic 1.6>>1.3>>1.8>>1.7>>1.8> 1.6 Fibrinogen 756>>606> 609 INR 1.1 APTT 50  Resolved Hospital Problem list     Assessment & Plan:    Critically ill due to Acute hypoxic and AoC hypercapneic respiratory failure; likely underly OHS requiring VV ECMO support and mechanical ventilation.  ARDS due to COVID 19 pneumonia   MSSA pneumonia Fungal pneumonia- Candida dublinensis Tracheostomy in place; previously had bleeding -Continue ultra lung protective ventilation.  -Continue VV ECMO support -VAP prevention protocol -Not currently requiring sedation for mechanical ventilation. -Routine trach care; remove araxpack hemostatic dressing today -Antimicrobials per ID  Enterococcal bacteremia-recent blood cultures cleared Concern for possible embolic phenomenon- fingers and possibly CVAs were embolic  Septic shock -Antimicrobials per ID recommendations. -Reculture if escalating vasopressor requirements or new fevers  Elevated LDH, hypo-fibrinogenemia and thrombocytopenia--improving -Continue to monitor  ICH, multifocal. L-sided weakness.  Critical illness myopathy -Continue PT, OT  Acute metabolic encephalopathy, improved ICU delirium -Sedation requirements minimal; only PRN now -Seroquel and melatonin nightly -Continue renal replacement therapy. -Delirium  precautions   Acute renal failure, severe uremia.  Anuric. -Continue CRRT with net fluid removal  -Renally dose medications -Strict I/os  Shock related to  RV strain - Con't milrinone   Hyperglycemia > not controlled at all with [place] insulin despite aggressive upward titration.  Assume poor absorption, potentially due to subcutaneous edema.   -Remains elevated on IV.   Best practice:  Diet: tube feeding Pain/Anxiety/Delirium protocol (if indicated): as above VAP protocol (if indicated): yes DVT prophylaxis: SCDs , heparin on hold for trach oozing GI prophylaxis: pantoprazole Glucose control:  insulin infusion  Mobility: bed rest Code Status: limited Family Communication: Husband at bedside Disposition: ICU    This patient is critically ill with multiple organ system failure which requires frequent high complexity decision making, assessment, support, evaluation, and titration of therapies. This was completed through the application of advanced monitoring technologies and extensive interpretation of multiple databases. During this encounter critical care time was devoted to patient care services described in this note for 35 minutes.   Kipp Brood, MD 02/10/20 5:36 PM [facility]

## 2020-02-10 NOTE — Procedures (Signed)
Extracorporeal support note   ECLS support day: 24 (cannulated 02/01/2020) Indication: Covid 19 pneumonia/ARDS  Configuration: VV  Drainage cannula: right IJ [place]  Return cannula: same  Pump speed: 4000 Pump flow: 4.83 Pump used: Centrimag  Oxygenator: Quadrox O2 blender: 100% Sweep gas: 13  Circuit check: no visible clot Anticoagulant: Continue heparin Anticoagulation targets: heparin fixed dose 500 units per hour  Changes in support: con't current flow and sweep  Anticipated goals/duration of support: bridge to recovery  Kipp Brood, MD 02/10/20 5:35 PM [facility]

## 2020-02-10 NOTE — Progress Notes (Signed)
Pharmacy Antibiotic Note  Alisha Stephenson is a 54 y.o. female admitted on 01/29/2020 with COVID pneumonia now s/p VV ECMO cannulation on 01/21/2020.    Found to have staph aureus (MSSA) + enterococcus faecalis (pan-sen). Patient has been on high-dose Zosyn given pharmacokinetic properties with ECMO. Now with concern of endocarditis. BAL from 8/24 growing candida - completed 7-day course of Eraxis.  WBC slightly down to 14.9, LA 1.7. Remains on CRRT, fever slowly trending up but normothermic on ECMO.  Plan: Continue Zosyn 4.5g IV q6 hrs - plan for end date on 9/7 Monitor clinical progression, cultures, CRRT duration   Height: _0  (162.6 cm) Weight: 124.9 kg (275 lb 5.7 oz) IBW/kg (Calculated) : 54.7  Temp (24hrs), Avg:98.1 F (36.7 C), Min:97.9 F (36.6 C), Max:98.4 F (36.9 C)  Recent Labs  Lab 02/07/20 1717 02/08/20 0414 02/08/20 0415 02/08/20 1422 02/08/20 1628 02/09/20 0341 02/09/20 1604 02/10/20 0322  WBC  --  14.2*  --   --  17.7* 16.4* 17.5* 14.9*  CREATININE  --  0.80  --   --  0.87 0.94 1.02* 1.02*  LATICACIDVEN   < >  --  1.8 1.5  --  1.6 1.7 1.7   < > = values in this interval not displayed.    Estimated Creatinine Clearance: 81.5 mL/min (A) (by C-G formula based on SCr of 1.02 mg/dL (H)).    Allergies  Allergen Reactions  . Sulfa Antibiotics Rash and Other (See Comments)   Antimicrobials this admission: 8/3 CTX >> 8/6; 8/22 >>8/23 8/3 zithromax >> 8/6 8/2 Remdesivir >> 8/6 8/2 Actemra 8/2 solu-medrol 60 Q12>> 8/6 decr to 40/d > 8/12 Vanc >>8/15; 8/19 >> 8/22 8/12 Meropenem >> 8/13 8/13 Zosyn high dose  >> 8/22; 8/23 >> 8/19 Fluconazole >> 8/22 8/22 ampicillin >> 8/23 8/26 Eraxis >> 9/2  Microbiology Results:  8/25 Bcx: neg 8/24 BAL: abundant yeast- 10k candida dubliniensis 8/19 Bcx: neg 8/15 Bcx: neg 8/12 Bcx: enterococcus amp S 8/12 TA: abundant MSSA 8/2 BCx: NGF 8/4 UCx: ngf 8/4 Sputum: normal flora 8/3 MRSA PCR: neg 8/2 COVID:  positive  Richardine Service, PharmD PGY2 Cardiology Pharmacy Resident Phone: [phone] 02/10/2020  11:57 AM  Please check [email or web address] for unit-specific pharmacy phone numbers.

## 2020-02-10 NOTE — Progress Notes (Signed)
Patient ID: Alisha Stephenson, female   DOB: 1965-12-10, 54 y.o.   MRN: [id number]    Advanced Heart Failure Rounding Note   Subjective:    - 8/2 COVID + test - 8/9 Cannulated for VV ECMO - 8/13 with several areas of intracranial hemorrhage. Bival stopped.  - 8/14 CT no change in [place]. Increased edema - 8/16 Extubated - 8/16 Head CT stable bleed - 8/22 CVVHD started - 8/24 reintubated - 8/24 circuit changed - 8/30 trach  Remains awake on vent. Will follow commands. Very weak. Cannot move upper extremities.  Heparin remains at 500 No further bleeding.   Remains anuric on CVVHD.  Off sedatives.  CXR with persistent diffuse infiltrates mild improved   ECMO   Flow 4.8 L RPM 4000 Sweep13  Labs:  7.43/40/53/88% Fio2 40% TV 80cc Hgb8.0 PLT 105k LDH 450 Lactic acid1.7   Objective:   Weight Range:  Vital Signs:   Temp:  [97.9 F (36.6 C)-98.4 F (36.9 C)] 98.4 F (36.9 C) (09/04 0324) Pulse Rate:  [85-107] 104 (09/04 0700) Resp:  [15-22] 15 (09/04 0324) BP: (73-145)/(45-55) 73/47 (09/04 0324) SpO2:  [83 %-93 %] 83 % (09/04 0700) Arterial Line BP: (93-156)/(41-66) 125/41 (09/04 0700) FiO2 (%):  [40 %] 40 % (09/04 0324) Last BM Date: 02/09/20  Weight change: Filed Weights   02/07/20 0411 02/08/20 0530 02/09/20 0500  Weight: 121 kg 130.6 kg 126 kg    Intake/Output:   Intake/Output Summary (Last 24 hours) at 02/10/2020 0710 Last data filed at 02/10/2020 0700 Gross per 24 hour  Intake 1579.22 ml  Output 4002 ml  Net -2422.78 ml     Physical Exam: General:  Awake on vent. Will follow commands but unable to move upper extremities and lower extremities very weak HEENT: normal Neck: supple. + trach Carotids 2+ bilat; no bruits. No lymphadenopathy or thryomegaly appreciated. Cor: PMI nondisplaced. Regular rate & rhythm. No rubs, gallops or murmurs. Lungs: minimal air movement  Abdomen: obese soft, nontender, nondistended. No hepatosplenomegaly. No bruits or  masses. Good bowel sounds. Extremities: no cyanosis, clubbing, rash, 1+ edema Neuro:  Awake on vent. Will follow commands but unable to move upper extremities and lower extremities very weak   Telemetry: Sinus 90-105 Personally reviewed   Labs: Basic Metabolic Panel: Recent Labs  Lab 02/07/20 0305 02/07/20 0326 02/08/20 0414 02/08/20 0513 02/08/20 1628 02/08/20 2303 02/09/20 0341 02/09/20 0352 02/09/20 1604 02/09/20 1604 02/09/20 1611 02/09/20 1658 02/09/20 2012 02/10/20 0322 02/10/20 0353  NA 137   < > 139   < > 138   < > 137   < > 136   < > 140 139 138 135 137  K 3.7   < > 4.0   < > 4.5   < > 4.1   < > 4.3   < > 4.2 4.9 4.4 4.2 4.3  CL 101   < > 104  --  102  --  102  --  102  --   --   --   --  102  --   CO2 22   < > 24  --  25  --  24  --  22  --   --   --   --  22  --   GLUCOSE 180*   < > 135*  --  129*  --  215*  --  162*  --   --   --   --  138*  --   BUN  32*   < > 34*  --  33*  --  33*  --  30*  --   --   --   --  25*  --   CREATININE 0.79   < > 0.80  --  0.87  --  0.94  --  1.02*  --   --   --   --  1.02*  --   CALCIUM 8.0*   < > 8.0*   < > 8.1*  --  8.1*  --  8.4*  --   --   --   --  8.5*  --   MG 2.5*  --  2.4  --   --   --  2.8*  --  2.7*  --   --   --   --  2.7*  --   PHOS 3.0  --  3.1  --   --   --  2.2*  --  4.4  --   --   --   --  2.5  --    < > = values in this interval not displayed.    Liver Function Tests: Recent Labs  Lab 02/04/20 0404 02/05/20 0304  AST 105* 73*  ALT 158* 151*  ALKPHOS 243* 264*  BILITOT 1.8* 1.6*  PROT 5.7* 5.7*  ALBUMIN 2.2* 2.1*   No results for input(s): LIPASE, AMYLASE in the last 168 hours. No results for input(s): AMMONIA in the last 168 hours.  CBC: Recent Labs  Lab 02/07/20 0311 02/07/20 0326 02/08/20 0414 02/08/20 0513 02/08/20 1628 02/08/20 2303 02/09/20 0341 02/09/20 0352 02/09/20 1604 02/09/20 1604 02/09/20 1611 02/09/20 1658 02/09/20 2012 02/10/20 0322 02/10/20 0353  WBC 13.6*   < > 14.2*  --   17.7*  --  16.4*  --  17.5*  --   --   --   --  14.9*  --   NEUTROABS 10.3*  --   --   --   --   --   --   --   --   --   --   --   --   --   --   HGB 8.7*   < > 8.7*   < > 8.9*   < > 8.7*   < > 8.6*   < > 9.5* 9.2* 8.8* 8.0* 8.5*  HCT 27.7*   < > 28.2*   < > 28.6*   < > 28.0*   < > 28.5*   < > 28.0* 27.0* 26.0* 26.6* 25.0*  MCV 102.2*   < > 102.9*  --  105.5*  --  105.7*  --  107.1*  --   --   --   --  108.1*  --   PLT 69*   < > 99*  --  104*  --  111*  --  104*  --   --   --   --  105*  --    < > = values in this interval not displayed.    Cardiac Enzymes: No results for input(s): CKTOTAL, CKMB, CKMBINDEX, TROPONINI in the last 168 hours.  BNP: BNP (last 3 results) No results for input(s): BNP in the last 8760 hours.  ProBNP (last 3 results) No results for input(s): PROBNP in the last 8760 hours.    Other results:  Imaging: DG Abd 1 View  Result Date: 02/09/2020 CLINICAL DATA:  NG placement EXAM: ABDOMEN - 1 VIEW COMPARISON:  Chest 02/09/2020 FINDINGS: Feeding tube enters  the stomach with the tip in the region of the antrum or duodenal bulb. Normal bowel gas pattern Large bore vascular catheter in the right atrium/distal IVC unchanged from chest x-ray. IMPRESSION: Feeding tube tip in the region the gastric antrum or duodenal bulb. Normal bowel gas pattern. Electronically Signed   By: Franchot Gallo M.D.   On: 02/09/2020 12:08   DG CHEST PORT 1 VIEW  Result Date: 02/09/2020 CLINICAL DATA:  COVID positive.  Respiratory failure.  ECMO. EXAM: PORTABLE CHEST 1 VIEW COMPARISON:  Multiple recent previous exams. FINDINGS: Confluent bilateral airspace opacity is again noted and appears progressive in left upper lobe. Tracheostomy tube again noted in situ. Right PICC line tip is difficult to visualize but is probably in the right subclavian vein but may be obscured by the ECMO catheter. Left IJ central line tip is positioned over the innominate vein confluence. There is a multi lumen left  subclavian catheter with the tip overlying the medial aspect of the left subclavian vein. IMPRESSION: 1. Persistent diffuse bilateral airspace disease with interval progression in the left upper lobe. 2. Stable position of support tubing. Electronically Signed   By: Misty Stanley M.D.   On: 02/09/2020 07:21   DG Abd Portable 1V  Result Date: 02/09/2020 CLINICAL DATA:  Feeding tube placement. EXAM: PORTABLE ABDOMEN - 1 VIEW COMPARISON:  02/09/2020 FINDINGS: Feeding tube is been advanced into the duodenum with the tip in the second portion of duodenum. Central venous large-bore catheter extending into the distal IVC unchanged. Severe airspace disease bilaterally. No dilated bowel loops. IMPRESSION: Feeding tube has been advanced with the tip in the second portion the duodenum. Electronically Signed   By: Franchot Gallo M.D.   On: 02/09/2020 14:43     Medications:     Scheduled Medications: . sodium chloride   Intravenous Once  . sodium chloride   Intravenous Once  . amiodarone  200 mg Per Tube Daily  . vitamin C  500 mg Per Tube Daily  . B-complex with vitamin C  1 tablet Oral Daily  . chlorhexidine gluconate (MEDLINE KIT)  15 mL Mouth Rinse BID  . Chlorhexidine Gluconate Cloth  6 each Topical Daily  . clonazePAM  1 mg Per Tube Q6H  . docusate  100 mg Per Tube BID  . feeding supplement (PROSource TF)  45 mL Per Tube TID  . fentaNYL (SUBLIMAZE) injection  50 mcg Intravenous Once  . linagliptin  5 mg Per Tube Daily  . mouth rinse  15 mL Mouth Rinse 10 times per day  . melatonin  9 mg Per Tube QHS  . metoCLOPramide (REGLAN) injection  5 mg Intravenous Q12H  . midodrine  5 mg Per Tube TID WC  . oxyCODONE  10 mg Per Tube Q6H  . pantoprazole (PROTONIX) IV  40 mg Intravenous BID  . polyethylene glycol  17 g Per Tube Daily  . QUEtiapine  50 mg Per Tube QHS  . sennosides  5 mL Per Tube BID  . zinc sulfate  220 mg Per Tube Daily    Infusions: .  prismasol BGK 4/2.5 300 mL/hr at 02/09/20 2042   . sodium chloride 250 mL (02/09/20 1130)  . sodium chloride 10 mL/hr at 02/10/20 0700  . albumin human 12.5 g (02/10/20 0343)  . albumin human Stopped (02/06/20 2000)  . dexmedetomidine (PRECEDEX) IV infusion Stopped (02/09/20 0616)  . feeding supplement (PIVOT 1.5 CAL) 75 mL/hr at 02/08/20 2015  . heparin 500 Units/hr (02/10/20 0700)  .  insulin 2 mL/hr at 02/10/20 0700  . milrinone Stopped (02/05/20 0905)  . norepinephrine (LEVOPHED) Adult infusion Stopped (02/09/20 0728)  . piperacillin-tazobactam (ZOSYN)  IV Stopped (02/10/20 6226)  . prismasol BGK 2/2.5 replacement solution 500 mL/hr at 02/10/20 0111  . prismasol BGK 4/2.5 2,000 mL/hr at 02/10/20 3335  . vasopressin Stopped (02/06/20 1741)    PRN Medications: sodium chloride, sodium chloride, acetaminophen (TYLENOL) oral liquid 160 mg/5 mL, albumin human, albuterol, dextrose, fentaNYL, fentaNYL (SUBLIMAZE) injection, guaiFENesin-dextromethorphan, heparin, hydrALAZINE, HYDROmorphone (DILAUDID) injection, hydrOXYzine, ondansetron **OR** ondansetron (ZOFRAN) IV, sodium chloride   Assessment/Plan:   1. Acute hypoxic/hypercapneic respiratory failure in setting of severe COVID PNA/ARDS -> VV ECMO - admit 8/2 - intubation 8/3 - has received actmera (compelted 8/2), remdesivir (completed 8/6) and steroids - failed full vent support with proning/paralytic -> Cannulated for VV ECMO on 8/9 - Extubated 8/16. Reintubated 8/24 - CXR diffuse infiltrates. Slightly improved.  - s/p trach 8/30. Oozing from trach has stopped.   - Circuit changed 8/24 due to concern for hemolysis and worsening oxygenation. Oxygenation improved. LDH down. PLTs rebounding slolwy  - CVVHD started 8/22 for volume removal and uremia. Remains anuric.Volume status improved. Keep even to mildly negative - On/off NE. Will start midodrine  - Awake on vent today. Has grown increasingly weak. Not moving upper body. Suspect severe critical illness myopathy. PT seeing. She will  have a long road as she seems unable to move upper body appreciably.  Lurline Idol site ok. Back on heparin. Discussed dosing with PharmD personally. - sats marginal. Continue high-flow on ECMO. CXR slightly improved today. D/w Dr. Lynetta Mare on rounds and vent changes made to try and come down on sweep gas.  - Continue melatonin and seroquel for sleep at night   2. Enterococcus sepsis - back on high-dose zosyn per ID - Eraxis added on 8/26 with yeast in BAL 8/24 (ended 9/2) Discussed dosing with PharmD personally. - WBC 17k -> 14k  3. Intracranial hemorrhage - ? Septic emboli - repeat head CT on 8/14 with stable bleeds but increased edema - neurology has seen. Suspect significant long-term injury sustained. Will follow commands. Appears to have dense LUE weakness and possibly LLE - repeat head CT stable 8/16 - tolerating low-dose heparin - Consider TEE at some point to further evalaute. Can consider now that she is re-intubated but won't change management currently  4. Thrombocytopenia - PLTs up > 100k - Continue to follow  5. Morbid obesity - Body mass index is Body mass index is 47.68 kg/m.  6. Poorly controlled DM2 - HgBA1c 10.7 - CBGs remain elevated - On high-dose IV insulin and sugars still high  7. PAF - intermittent episodes. Last 8/26 - Now on po  8. AKI/azotemia - CVVHD started on 8/22 - Down 40 pounds.  - Keep even to slightly negative - Remains anuric. No change.  10. Ischemic R fingers - ? Due to septic embolic versus vascular issue - VVS following. No change  11. Acute liver failure - has underlying fatty liver - mix of conjugated/unconjugated - LFTs trending back down   CRITICAL CARE Performed by: Glori Bickers  Total critical care time: 35 minutes  Critical care time was exclusive of separately billable procedures and treating other patients.  Critical care was necessary to treat or prevent imminent or life-threatening  deterioration.  Critical care was time spent personally by me (independent of midlevel providers or residents) on the following activities: development of treatment plan with patient and/or surrogate as  well as nursing, discussions with consultants, evaluation of patient's response to treatment, examination of patient, obtaining history from patient or surrogate, ordering and performing treatments and interventions, ordering and review of laboratory studies, ordering and review of radiographic studies, pulse oximetry and re-evaluation of patient's condition.   Length of Stay: 46   Zerrick Hanssen  MD 02/10/2020, 7:10 AM  Advanced Heart Failure Team Pager [phone] (M-F; 7a - 4p)  Please contact [facility] for night-coverage after hours (4p -7a ) and weekends on [email or web address]

## 2020-02-11 ENCOUNTER — Inpatient Hospital Stay (HOSPITAL_COMMUNITY): Payer: PRIVATE HEALTH INSURANCE

## 2020-02-11 LAB — BASIC METABOLIC PANEL
Anion gap: 14 (ref 5–15)
Anion gap: 12 (ref 5–15)
BUN: 28 mg/dL — ABNORMAL HIGH (ref 6–20)
BUN: 20 mg/dL (ref 6–20)
CO2: 23 mmol/L (ref 22–32)
CO2: 23 mmol/L (ref 22–32)
Calcium: 8.5 mg/dL — ABNORMAL LOW (ref 8.9–10.3)
Calcium: 8.5 mg/dL — ABNORMAL LOW (ref 8.9–10.3)
Chloride: 101 mmol/L (ref 98–111)
Chloride: 102 mmol/L (ref 98–111)
Creatinine, Ser: 1.06 mg/dL — ABNORMAL HIGH (ref 0.44–1.00)
Creatinine, Ser: 1 mg/dL (ref 0.44–1.00)
GFR calc Af Amer: >60 mL/min (ref >60)
GFR calc Af Amer: >60 mL/min (ref >60)
GFR calc non Af Amer: 59 mL/min — ABNORMAL LOW (ref >60)
GFR calc non Af Amer: >60 mL/min (ref >60)
Glucose, Bld: 172 mg/dL — ABNORMAL HIGH (ref 70–99)
Glucose, Bld: 127 mg/dL — ABNORMAL HIGH (ref 70–99)
Potassium: 4 mmol/L (ref 3.5–5.1)
Potassium: 4.1 mmol/L (ref 3.5–5.1)
Sodium: 138 mmol/L (ref 135–145)
Sodium: 137 mmol/L (ref 135–145)

## 2020-02-11 LAB — POCT I-STAT 7, (LYTES, BLD GAS, ICA,H+H)
Acid-Base Excess: 1 mmol/L (ref 0.0–2.0)
Acid-Base Excess: 3 mmol/L — ABNORMAL HIGH (ref 0.0–2.0)
Acid-Base Excess: 2 mmol/L (ref 0.0–2.0)
Acid-Base Excess: 2 mmol/L (ref 0.0–2.0)
Acid-Base Excess: 3 mmol/L — ABNORMAL HIGH (ref 0.0–2.0)
Bicarbonate: 25.3 mmol/L (ref 20.0–28.0)
Bicarbonate: 28.3 mmol/L — ABNORMAL HIGH (ref 20.0–28.0)
Bicarbonate: 27.6 mmol/L (ref 20.0–28.0)
Bicarbonate: 27.3 mmol/L (ref 20.0–28.0)
Bicarbonate: 27.1 mmol/L (ref 20.0–28.0)
Calcium, Ion: 1.14 mmol/L — ABNORMAL LOW (ref 1.15–1.40)
Calcium, Ion: 1.11 mmol/L — ABNORMAL LOW (ref 1.15–1.40)
Calcium, Ion: 1.13 mmol/L — ABNORMAL LOW (ref 1.15–1.40)
Calcium, Ion: 1.13 mmol/L — ABNORMAL LOW (ref 1.15–1.40)
Calcium, Ion: 1.09 mmol/L — ABNORMAL LOW (ref 1.15–1.40)
HCT: 26 % — ABNORMAL LOW (ref 36.0–46.0)
HCT: 26 % — ABNORMAL LOW (ref 36.0–46.0)
HCT: 27 % — ABNORMAL LOW (ref 36.0–46.0)
HCT: 27 % — ABNORMAL LOW (ref 36.0–46.0)
HCT: 26 % — ABNORMAL LOW (ref 36.0–46.0)
Hemoglobin: 8.8 g/dL — ABNORMAL LOW (ref 12.0–15.0)
Hemoglobin: 8.8 g/dL — ABNORMAL LOW (ref 12.0–15.0)
Hemoglobin: 9.2 g/dL — ABNORMAL LOW (ref 12.0–15.0)
Hemoglobin: 9.2 g/dL — ABNORMAL LOW (ref 12.0–15.0)
Hemoglobin: 8.8 g/dL — ABNORMAL LOW (ref 12.0–15.0)
O2 Saturation: 92 %
O2 Saturation: 95 %
O2 Saturation: 95 %
O2 Saturation: 93 %
O2 Saturation: 93 %
Patient temperature: 99.1
Patient temperature: 36
Potassium: 4 mmol/L (ref 3.5–5.1)
Potassium: 4.5 mmol/L (ref 3.5–5.1)
Potassium: 4.3 mmol/L (ref 3.5–5.1)
Potassium: 4.1 mmol/L (ref 3.5–5.1)
Potassium: 4.5 mmol/L (ref 3.5–5.1)
Sodium: 138 mmol/L (ref 135–145)
Sodium: 139 mmol/L (ref 135–145)
Sodium: 138 mmol/L (ref 135–145)
Sodium: 139 mmol/L (ref 135–145)
Sodium: 139 mmol/L (ref 135–145)
TCO2: 26 mmol/L (ref 22–32)
TCO2: 30 mmol/L (ref 22–32)
TCO2: 29 mmol/L (ref 22–32)
TCO2: 29 mmol/L (ref 22–32)
TCO2: 28 mmol/L (ref 22–32)
pCO2 arterial: 40.5 mmHg (ref 32.0–48.0)
pCO2 arterial: 44 mmHg (ref 32.0–48.0)
pCO2 arterial: 42.5 mmHg (ref 32.0–48.0)
pCO2 arterial: 45 mmHg (ref 32.0–48.0)
pCO2 arterial: 40.1 mmHg (ref 32.0–48.0)
pH, Arterial: 7.405 (ref 7.350–7.450)
pH, Arterial: 7.417 (ref 7.350–7.450)
pH, Arterial: 7.417 (ref 7.350–7.450)
pH, Arterial: 7.391 (ref 7.350–7.450)
pH, Arterial: 7.438 (ref 7.350–7.450)
pO2, Arterial: 64 mmHg — ABNORMAL LOW (ref 83.0–108.0)
pO2, Arterial: 78 mmHg — ABNORMAL LOW (ref 83.0–108.0)
pO2, Arterial: 71 mmHg — ABNORMAL LOW (ref 83.0–108.0)
pO2, Arterial: 68 mmHg — ABNORMAL LOW (ref 83.0–108.0)
pO2, Arterial: 64 mmHg — ABNORMAL LOW (ref 83.0–108.0)

## 2020-02-11 LAB — LACTATE DEHYDROGENASE: LDH: 489 U/L — ABNORMAL HIGH (ref 98–192)

## 2020-02-11 LAB — HEPARIN LEVEL (UNFRACTIONATED): Heparin Unfractionated: <0.1 IU/mL — ABNORMAL LOW (ref 0.30–0.70)

## 2020-02-11 LAB — GLUCOSE, CAPILLARY
Glucose-Capillary: 112 mg/dL — ABNORMAL HIGH (ref 70–99)
Glucose-Capillary: 120 mg/dL — ABNORMAL HIGH (ref 70–99)
Glucose-Capillary: 129 mg/dL — ABNORMAL HIGH (ref 70–99)
Glucose-Capillary: 130 mg/dL — ABNORMAL HIGH (ref 70–99)
Glucose-Capillary: 131 mg/dL — ABNORMAL HIGH (ref 70–99)
Glucose-Capillary: 132 mg/dL — ABNORMAL HIGH (ref 70–99)
Glucose-Capillary: 136 mg/dL — ABNORMAL HIGH (ref 70–99)
Glucose-Capillary: 139 mg/dL — ABNORMAL HIGH (ref 70–99)
Glucose-Capillary: 140 mg/dL — ABNORMAL HIGH (ref 70–99)
Glucose-Capillary: 143 mg/dL — ABNORMAL HIGH (ref 70–99)
Glucose-Capillary: 147 mg/dL — ABNORMAL HIGH (ref 70–99)
Glucose-Capillary: 147 mg/dL — ABNORMAL HIGH (ref 70–99)
Glucose-Capillary: 148 mg/dL — ABNORMAL HIGH (ref 70–99)
Glucose-Capillary: 153 mg/dL — ABNORMAL HIGH (ref 70–99)
Glucose-Capillary: 157 mg/dL — ABNORMAL HIGH (ref 70–99)
Glucose-Capillary: 158 mg/dL — ABNORMAL HIGH (ref 70–99)
Glucose-Capillary: 160 mg/dL — ABNORMAL HIGH (ref 70–99)
Glucose-Capillary: 179 mg/dL — ABNORMAL HIGH (ref 70–99)

## 2020-02-11 LAB — CBC
HCT: 26.9 % — ABNORMAL LOW (ref 36.0–46.0)
HCT: 28 % — ABNORMAL LOW (ref 36.0–46.0)
Hemoglobin: 8 g/dL — ABNORMAL LOW (ref 12.0–15.0)
Hemoglobin: 8.4 g/dL — ABNORMAL LOW (ref 12.0–15.0)
MCH: 32.4 pg (ref 26.0–34.0)
MCH: 33.1 pg (ref 26.0–34.0)
MCHC: 29.7 g/dL — ABNORMAL LOW (ref 30.0–36.0)
MCHC: 30 g/dL (ref 30.0–36.0)
MCV: 108.9 fL — ABNORMAL HIGH (ref 80.0–100.0)
MCV: 110.2 fL — ABNORMAL HIGH (ref 80.0–100.0)
Platelets: 114 10*3/uL — ABNORMAL LOW (ref 150–400)
Platelets: 106 10*3/uL — ABNORMAL LOW (ref 150–400)
RBC: 2.47 MIL/uL — ABNORMAL LOW (ref 3.87–5.11)
RBC: 2.54 MIL/uL — ABNORMAL LOW (ref 3.87–5.11)
RDW: 29.9 % — ABNORMAL HIGH (ref 11.5–15.5)
RDW: 29.9 % — ABNORMAL HIGH (ref 11.5–15.5)
WBC: 17.4 10*3/uL — ABNORMAL HIGH (ref 4.0–10.5)
WBC: 15.5 10*3/uL — ABNORMAL HIGH (ref 4.0–10.5)
nRBC: 7.8 % — ABNORMAL HIGH (ref 0.0–0.2)
nRBC: 4.7 % — ABNORMAL HIGH (ref 0.0–0.2)

## 2020-02-11 LAB — LACTIC ACID, PLASMA
Lactic Acid, Venous: 1.6 mmol/L (ref 0.5–1.9)
Lactic Acid, Venous: 1.2 mmol/L (ref 0.5–1.9)

## 2020-02-11 LAB — FIBRINOGEN: Fibrinogen: 547 mg/dL — ABNORMAL HIGH (ref 210–475)

## 2020-02-11 LAB — APTT
aPTT: 38 seconds — ABNORMAL HIGH (ref 24–36)
aPTT: 39 seconds — ABNORMAL HIGH (ref 24–36)

## 2020-02-11 LAB — MAGNESIUM: Magnesium: 2.7 mg/dL — ABNORMAL HIGH (ref 1.7–2.4)

## 2020-02-11 LAB — PHOSPHORUS: Phosphorus: 2.7 mg/dL (ref 2.5–4.6)

## 2020-02-11 MED ORDER — METOCLOPRAMIDE HCL 5 MG/ML IJ SOLN
10.0000 mg | Freq: Four times a day (QID) | INTRAMUSCULAR | Status: AC
  Administered 2020-02-11 – 2020-02-13 (×8): 10 mg via INTRAVENOUS
  Filled 2020-02-11 (×8): qty 2

## 2020-02-11 MED ORDER — SODIUM BICARBONATE 650 MG PO TABS
650.0000 mg | ORAL_TABLET | Freq: Once | ORAL | Status: AC
  Administered 2020-02-12: 650 mg
  Filled 2020-02-11: qty 1

## 2020-02-11 MED ORDER — SODIUM BICARBONATE 650 MG PO TABS
650.0000 mg | ORAL_TABLET | Freq: Once | ORAL | Status: AC
  Administered 2020-02-11: 650 mg
  Filled 2020-02-11: qty 1

## 2020-02-11 MED ORDER — PANCRELIPASE (LIP-PROT-AMYL) 10440-39150 UNITS PO TABS
20880.0000 [IU] | ORAL_TABLET | Freq: Once | ORAL | Status: AC
  Administered 2020-02-11: 20880 [IU]
  Filled 2020-02-11: qty 2

## 2020-02-11 MED ORDER — PANCRELIPASE (LIP-PROT-AMYL) 10440-39150 UNITS PO TABS
20880.0000 [IU] | ORAL_TABLET | Freq: Once | ORAL | Status: AC
  Administered 2020-02-12: 20880 [IU]
  Filled 2020-02-11: qty 2

## 2020-02-11 MED ORDER — MIDODRINE HCL 5 MG PO TABS
2.5000 mg | ORAL_TABLET | Freq: Three times a day (TID) | ORAL | Status: DC
  Administered 2020-02-11 (×2): 2.5 mg
  Filled 2020-02-11 (×2): qty 1

## 2020-02-11 NOTE — Progress Notes (Signed)
ANTICOAGULATION CONSULT NOTE - [place] for Heparin Indication: ECMO  Allergies  Allergen Reactions  . Sulfa Antibiotics Rash and Other (See Comments)    Patient Measurements: Height: 5\' 4"  (162.6 cm) Weight: 124.6 kg (274 lb 11.1 oz) IBW/kg (Calculated) : 54.7 Heparin Dosing Weight: 87 kg  Vital Signs: Temp: 98 F (36.7 C) (09/05 0300) Temp Source: Oral (09/05 0300) BP: 110/41 (09/05 0414) Pulse Rate: 91 (09/05 0700)  Labs: Recent Labs    02/09/20 0341 02/09/20 0352 02/10/20 0322 02/10/20 0353 02/10/20 0749 02/10/20 1208 02/10/20 1701 02/10/20 1710 02/10/20 2000 02/10/20 2000 02/11/20 0322 02/11/20 0323 02/11/20 0333  HGB 8.7*   < > 8.0*   < >   < >  --  8.2*   < > 8.8*   < > 8.0*  --  8.8*  HCT 28.0*   < > 26.6*   < >   < >  --  27.5*   < > 26.0*  --  26.9*  --  26.0*  PLT 111*   < > 105*  --   --   --  121*  --   --   --  114*  --   --   APTT 50*   < > 38*  --   --   --  38*  --   --   --  38*  --   --   HEPARINUNFRC <0.10*  --   --   --   --  <0.10*  --   --   --   --   --  <0.10*  --   CREATININE 0.94   < > 1.02*  --   --   --  1.09*  --   --   --  1.06*  --   --    < > = values in this interval not displayed.    Estimated Creatinine Clearance: 78.3 mL/min (A) (by C-G formula based on SCr of 1.06 mg/dL (H)).   Medical History: Past Medical History:  Diagnosis Date  . Asthma   . Diabetes mellitus without complication ([place])   . Diverticulitis   . Gallstones   . IBS (irritable bowel syndrome)   . NAFLD (nonalcoholic fatty liver disease)    CT scan 2015  . Ovarian cyst     Assessment: 54 yo female who is COVID+ on VV ECMO. Patient was started on bivalirudin but was found to have multiple ICH on 8/13 head CT. Bivalirudin was stopped at this time and pt has been off anticoagulation. Her ECMO circuit was changed 8/24, and pharmacy asked to start low-fixed rate heparin.  Heparin infusion was held last week due to bleeding from  trach site. Hgb 8, plts increased to 114 this morning. Fibrinogen 547, LDH 489. ECMO circuit is stable - mild clots in circuit corners.   Heparin at 500 units/hr with plans to not titrate for now. Heparin level remains undetectable, aPTT 38  Goal of Therapy:  Heparin level 0.2-0.5 units/ml - currently not titrating to goal per discussion with ECMO team Monitor platelets by anticoagulation protocol: Yes   Plan:  Continue Heparin infusion at 500 units/hr  Monitor daily CBC, LDH, fibrinogen, s/sx of bleeding  Richardine Service, PharmD PGY2 Cardiology Pharmacy Resident Phone: [phone] 02/11/2020  12:21 PM  Please check [email or web address] for unit-specific pharmacy phone numbers.

## 2020-02-11 NOTE — Progress Notes (Signed)
Assisted tele visit to patient with daughter.  Alisha Stephenson eLink RN

## 2020-02-11 NOTE — Progress Notes (Signed)
Patient ID: Alisha Stephenson, female   DOB: 09/18/1965, 54 y.o.   MRN: [id number]    Advanced Heart Failure Rounding Note   Subjective:    - 8/2 COVID + test - 8/9 Cannulated for VV ECMO - 8/13 with several areas of intracranial hemorrhage. Bival stopped.  - 8/14 CT no change in [place]. Increased edema - 8/16 Extubated - 8/16 Head CT stable bleed - 8/22 CVVHD started - 8/24 reintubated - 8/24 circuit changed - 8/30 trach  Remains awake on vent. Will follow commands. Very weak. Cannot move upper extremities.  Heparin remains at 500 No further bleeding.   Remains anuric on CVVHD. Pulling 25-50/hr. Rate pulled back to chugging. Weight stable at 275  Off sedatives. Coughs frequently with high-volume emesis  CXR with diffuse infiltrates. Somewhat improved    ECMO   Flow 4.8 L RPM 4000 Sweep13  Labs:  7.41/41/64/92% Fio2 40% TV 80cc-> 120 cc Hgb8.0 PLT 114k LDH 450 -> 489  Lactic acid1.6   Objective:   Weight Range:  Vital Signs:   Temp:  [98 F (36.7 C)-99.2 F (37.3 C)] 98 F (36.7 C) (09/05 0300) Pulse Rate:  [82-101] 88 (09/05 0800) Resp:  [18-30] 26 (09/05 0800) BP: (110-128)/(41-46) 110/41 (09/05 0414) SpO2:  [80 %-96 %] 95 % (09/05 0800) Arterial Line BP: (109-135)/(40-52) 133/46 (09/05 0800) FiO2 (%):  [40 %] 40 % (09/05 0800) Weight:  [124.6 kg] 124.6 kg (09/05 0500) Last BM Date: 02/11/20  Weight change: Filed Weights   02/09/20 0500 02/10/20 0500 02/11/20 0500  Weight: 126 kg 124.9 kg 124.6 kg    Intake/Output:   Intake/Output Summary (Last 24 hours) at 02/11/2020 0846 Last data filed at 02/11/2020 0800 Gross per 24 hour  Intake 2056.1 ml  Output 3110 ml  Net -1053.9 ml     Physical Exam: General:  Awake on vent. Will follow commands but unable to move upper extremities and lower extremities very weak HEENT: normal Neck: supple. + trach RIJ ECMO Carotids 2+ bilat; no bruits. No lymphadenopathy or thryomegaly appreciated. Cor: PMI  nondisplaced. Regular rate & rhythm. No rubs, gallops or murmurs. Lungs: clear decreased breath sounds Abdomen: obese soft, nontender, nondistended. No hepatosplenomegaly. No bruits or masses. Good bowel sounds. Extremities: no cyanosis, clubbing, rash, tr  edema Neuro: awake follows commands. Weak    Telemetry: Sinus 80-90s Personally reviewed   Labs: Basic Metabolic Panel: Recent Labs  Lab 02/08/20 0414 02/08/20 0513 02/09/20 0341 02/09/20 0352 02/09/20 1604 02/09/20 1611 02/10/20 0322 02/10/20 0353 02/10/20 1701 02/10/20 1710 02/10/20 2000 02/11/20 0322 02/11/20 0333  NA 139   < > 137   < > 136   < > 135   < > 138 139 139 138 138  K 4.0   < > 4.1   < > 4.3   < > 4.2   < > 4.2 4.1 4.3 4.0 4.0  CL 104   < > 102  --  102  --  102  --  104  --   --  101  --   CO2 24   < > 24  --  22  --  22  --  24  --   --  23  --   GLUCOSE 135*   < > 215*  --  162*  --  138*  --  186*  --   --  172*  --   BUN 34*   < > 33*  --  30*  --  25*  --  25*  --   --  28*  --   CREATININE 0.80   < > 0.94  --  1.02*  --  1.02*  --  1.09*  --   --  1.06*  --   CALCIUM 8.0*   < > 8.1*   < > 8.4*   < > 8.5*  --  8.4*  --   --  8.5*  --   MG 2.4  --  2.8*  --  2.7*  --  2.7*  --   --   --   --  2.7*  --   PHOS 3.1  --  2.2*  --  4.4  --  2.5  --   --   --   --  2.7  --    < > = values in this interval not displayed.    Liver Function Tests: Recent Labs  Lab 02/05/20 0304  AST 73*  ALT 151*  ALKPHOS 264*  BILITOT 1.6*  PROT 5.7*  ALBUMIN 2.1*   No results for input(s): LIPASE, AMYLASE in the last 168 hours. No results for input(s): AMMONIA in the last 168 hours.  CBC: Recent Labs  Lab 02/07/20 0311 02/07/20 0326 02/09/20 0341 02/09/20 0352 02/09/20 1604 02/09/20 1611 02/10/20 0322 02/10/20 0353 02/10/20 1701 02/10/20 1710 02/10/20 2000 02/11/20 0322 02/11/20 0333  WBC 13.6*   < > 16.4*  --  17.5*  --  14.9*  --  21.2*  --   --  17.4*  --   NEUTROABS 10.3*  --   --   --   --    --   --   --   --   --   --   --   --   HGB 8.7*   < > 8.7*   < > 8.6*   < > 8.0*   < > 8.2* 9.2* 8.8* 8.0* 8.8*  HCT 27.7*   < > 28.0*   < > 28.5*   < > 26.6*   < > 27.5* 27.0* 26.0* 26.9* 26.0*  MCV 102.2*   < > 105.7*  --  107.1*  --  108.1*  --  109.6*  --   --  108.9*  --   PLT 69*   < > 111*  --  104*  --  105*  --  121*  --   --  114*  --    < > = values in this interval not displayed.    Cardiac Enzymes: No results for input(s): CKTOTAL, CKMB, CKMBINDEX, TROPONINI in the last 168 hours.  BNP: BNP (last 3 results) No results for input(s): BNP in the last 8760 hours.  ProBNP (last 3 results) No results for input(s): PROBNP in the last 8760 hours.    Other results:  Imaging: DG Abd 1 View  Result Date: 02/09/2020 CLINICAL DATA:  NG placement EXAM: ABDOMEN - 1 VIEW COMPARISON:  Chest 02/09/2020 FINDINGS: Feeding tube enters the stomach with the tip in the region of the antrum or duodenal bulb. Normal bowel gas pattern Large bore vascular catheter in the right atrium/distal IVC unchanged from chest x-ray. IMPRESSION: Feeding tube tip in the region the gastric antrum or duodenal bulb. Normal bowel gas pattern. Electronically Signed   By: Franchot Gallo M.D.   On: 02/09/2020 12:08   DG CHEST PORT 1 VIEW  Result Date: 02/11/2020 CLINICAL DATA:  ECMO EXAM: PORTABLE CHEST 1 VIEW COMPARISON:  02/10/2020 FINDINGS: Tracheostomy in  satisfactory position.  ECMO catheter. Enteric tube courses into the stomach. Right arm PICC is obscured but likely terminates at the cavoatrial junction. Left subclavian catheter terminates at the level of the clavicle. Multifocal patchy opacities with slightly improved aeration of the lungs bilaterally. No pneumothorax. The cardiomediastinal silhouette is largely obscured. Cervical spine fixation hardware, incompletely visualized. IMPRESSION: Stable support apparatus in this patient on ECMO. Multifocal patchy opacities with slightly improved aeration of the lungs  bilaterally. Electronically Signed   By: Julian Hy M.D.   On: 02/11/2020 07:44   DG CHEST PORT 1 VIEW  Result Date: 02/10/2020 CLINICAL DATA:  ECMO EXAM: PORTABLE CHEST 1 VIEW COMPARISON:  Yesterday FINDINGS: Tracheostomy tube in place. Left subclavian line with tip at the subclavian level. Feeding tube at least reaches the diaphragm. ECMO catheter in similar position. The ECMO catheter obscures the tip of the right PICC. Airless lungs. Obscured heart size. No visible air leak. IMPRESSION: 1. Stable hardware positioning. 2. Airless lungs. Electronically Signed   By: Monte Fantasia M.D.   On: 02/10/2020 08:47   DG Abd Portable 1V  Result Date: 02/09/2020 CLINICAL DATA:  Feeding tube placement. EXAM: PORTABLE ABDOMEN - 1 VIEW COMPARISON:  02/09/2020 FINDINGS: Feeding tube is been advanced into the duodenum with the tip in the second portion of duodenum. Central venous large-bore catheter extending into the distal IVC unchanged. Severe airspace disease bilaterally. No dilated bowel loops. IMPRESSION: Feeding tube has been advanced with the tip in the second portion the duodenum. Electronically Signed   By: Franchot Gallo M.D.   On: 02/09/2020 14:43     Medications:     Scheduled Medications: . sodium chloride   Intravenous Once  . sodium chloride   Intravenous Once  . amiodarone  200 mg Per Tube Daily  . vitamin C  500 mg Per Tube Daily  . B-complex with vitamin C  1 tablet Oral Daily  . chlorhexidine gluconate (MEDLINE KIT)  15 mL Mouth Rinse BID  . Chlorhexidine Gluconate Cloth  6 each Topical Daily  . clonazePAM  1 mg Per Tube Q6H  . docusate  100 mg Per Tube BID  . feeding supplement (PROSource TF)  45 mL Per Tube TID  . fentaNYL (SUBLIMAZE) injection  50 mcg Intravenous Once  . linagliptin  5 mg Per Tube Daily  . mouth rinse  15 mL Mouth Rinse 10 times per day  . melatonin  9 mg Per Tube QHS  . metoCLOPramide (REGLAN) injection  5 mg Intravenous Q12H  . midodrine  5 mg Per  Tube TID WC  . oxyCODONE  10 mg Per Tube Q6H  . pantoprazole (PROTONIX) IV  40 mg Intravenous BID  . polyethylene glycol  17 g Per Tube Daily  . QUEtiapine  50 mg Per Tube QHS  . sennosides  5 mL Per Tube BID  . zinc sulfate  220 mg Per Tube Daily    Infusions: .  prismasol BGK 4/2.5 300 mL/hr at 02/11/20 0514  . sodium chloride 250 mL (02/09/20 1130)  . sodium chloride 10 mL/hr at 02/11/20 0800  . albumin human 12.5 g (02/10/20 1521)  . albumin human Stopped (02/06/20 2000)  . dexmedetomidine (PRECEDEX) IV infusion Stopped (02/09/20 0616)  . feeding supplement (PIVOT 1.5 CAL) 75 mL/hr at 02/11/20 0200  . heparin 500 Units/hr (02/11/20 0800)  . insulin 2.2 mL/hr at 02/11/20 0800  . milrinone Stopped (02/05/20 0905)  . norepinephrine (LEVOPHED) Adult infusion Stopped (02/09/20 0728)  . piperacillin-tazobactam (  ZOSYN)  IV Stopped (02/11/20 0549)  . prismasol BGK 2/2.5 replacement solution 500 mL/hr at 02/11/20 0742  . prismasol BGK 4/2.5 2,000 mL/hr at 02/11/20 0705  . vasopressin Stopped (02/06/20 1741)    PRN Medications: sodium chloride, sodium chloride, acetaminophen (TYLENOL) oral liquid 160 mg/5 mL, albumin human, albuterol, dextrose, fentaNYL, fentaNYL (SUBLIMAZE) injection, guaiFENesin-dextromethorphan, heparin, hydrALAZINE, HYDROmorphone (DILAUDID) injection, hydrOXYzine, ondansetron **OR** ondansetron (ZOFRAN) IV, sodium chloride   Assessment/Plan:   1. Acute hypoxic/hypercapneic respiratory failure in setting of severe COVID PNA/ARDS -> VV ECMO - admit 8/2 - intubation 8/3 - has received actmera (compelted 8/2), remdesivir (completed 8/6) and steroids - Cannulated for VV ECMO on 8/9 - Extubated 8/16. Reintubated 8/24 - CXR diffuse infiltrates. improving - s/p trach 8/30. - Circuit changed 8/24 due to concern for hemolysis and worsening oxygenation. - CVVHD started 8/22 for volume removal and uremia. Remains anuric.Volume status improved. Keep even to mildly  negative. No change today - Remains awake on vent  Has grown increasingly weak. Not moving upper body. Suspect severe critical illness myopathy. PT seeing. She will have a long road as she seems unable to move upper body appreciably.  - Remains on low-dose heparin. Numbers look good. Discussed dosing with PharmD personally. - TVs improving on vent. ABG ok. Sweep trial to 11 today - Continue melatonin and seroquel for sleep at night   2. Enterococcus sepsis - remain on high-dose zosyn (through 9/7) per ID - Eraxis added on 8/26 with yeast in BAL 8/24 (ended 9/2) Discussed dosing with PharmD personally. - WBC 17k -> 14k -> 21k -> 17k  3. Intracranial hemorrhage - ? Septic emboli - repeat head CT on 8/14 with stable bleeds but increased edema - neurology has seen. Suspect significant long-term injury sustained. Will follow commands. Appears to have dense LUE weakness and possibly LLE - repeat head CT stable 8/16 - tolerating low-dose heparin - Consider TEE at some point to further evalaute. Can consider now that she is re-intubated but won't change management currently  4. Thrombocytopenia - PLTs up > 100k - Continue to follow  5. Morbid obesity - Body mass index is Body mass index is 47.15 kg/m.  6. Poorly controlled DM2 - HgBA1c 10.7 - CBGs remain elevated - On high-dose IV insulin and sugars still high  7. PAF - intermittent episodes. Last 8/26 - Now on po. Quiescent  8. AKI/azotemia - CVVHD started on 8/22 - Down 40 pounds.  - Keep even to slightly negative - Remains anuric. No change.  10. Ischemic R fingers - ? Due to septic embolic versus vascular issue - VVS following. No change  11. Acute liver failure - has underlying fatty liver - mix of conjugated/unconjugated - LFTs trending back down  12. Emesis - KUB to check position of Cor-trak - Continue prokinetics   CRITICAL CARE Performed by: Glori Bickers  Total critical care time: 35  minutes  Critical care time was exclusive of separately billable procedures and treating other patients.  Critical care was necessary to treat or prevent imminent or life-threatening deterioration.  Critical care was time spent personally by me (independent of midlevel providers or residents) on the following activities: development of treatment plan with patient and/or surrogate as well as nursing, discussions with consultants, evaluation of patient's response to treatment, examination of patient, obtaining history from patient or surrogate, ordering and performing treatments and interventions, ordering and review of laboratory studies, ordering and review of radiographic studies, pulse oximetry and re-evaluation of patient's condition.  Length of Stay: 44   Glori Bickers  MD 02/11/2020, 8:46 AM  Advanced Heart Failure Team Pager [phone] (M-F; [place])  Please contact [facility] for night-coverage after hours (4p -7a ) and weekends on [email or web address]

## 2020-02-11 NOTE — Progress Notes (Signed)
SLP Cancellation Note  Patient Details Name: Alisha Stephenson MRN: [id number] DOB: 1966/03/09   Cancelled treatment:       Reason Eval/Treat Not Completed: Medical issues which prohibited therapy (Pt is currently on the vent. SLP will follow up. )  [place] I. Hardin Negus, [place], [place] Office number [phone] Pager [phone]  Alisha Stephenson 02/11/2020, 5:12 PM

## 2020-02-11 NOTE — Progress Notes (Signed)
NAME:  Alisha Stephenson, MRN:  [id number], DOB:  01-18-1966, LOS: 86 ADMISSION DATE:  01/23/2020, CONSULTATION DATE:  8/3 REFERRING MD:  Alfredia Ferguson, CHIEF COMPLAINT:  Dyspnea   Brief History   54 y/o female admitted on 8/2 with severe acute respiratory failure with hypoxemia due to COVID 19 pneumonia.  She developed symptoms 1 week prior to admission.  Past Medical History  DM2 Diverticulitis Gallstones Ovarian cyst NAFLD Asthma  Significant Hospital Events   8/2 admit 8/3 ICU transfer, intubated 8/4 prone, paralyze 8/9 significant desaturations today 8/9 VV ECMO cannulation 8/13 ICH 8/30 trach  Consults:  PCCM ECMO team  Procedures:  8/3 ETT > 8/30 8/30 8-0 shiley trach 8/3 PICC >  8/9 LIJ MML 8/9 RIJ [place] 52F   Significant Diagnostic Tests:  7/31 CT head > NAICP 7/31 MRI/MRA brain > no acute changes, possibly small aneurysm ACOM 8/13 CT head> multiple areas of ICH 9/5 CXR > significant improvement in aeration bilaterally.   Micro Data:  8/2 blood > NG 8/2 SARS COV 2 > positive 8/4 resp > negative 8/4 urine >  8/12 blood > E. faecalis (pan-sensitive) 8/12 resp: staph aureus> MSSA 8/25 blood>> NG 8/24 BAL> yeast  Antimicrobials:  See fever tab for past abx Current Eraxis 8/26>> Zosyn 8/23>>  Interim history/subjective:  Vomiting again overnight despite post-pyloric feeding tube.   Objective   Blood pressure (!) 110/41, pulse 85, temperature 98 F (36.7 C), temperature source Oral, resp. rate (!) 26, height _0  (1.626 m), weight 124.6 kg, SpO2 97 %.    Vent Mode: PCV FiO2 (%):  [40 %] 40 % Set Rate:  [15 bmp] 15 bmp PEEP:  [8 cmH20-10 cmH20] 10 cmH20 Plateau Pressure:  [22 cmH20-31 cmH20] 22 cmH20   Intake/Output Summary (Last 24 hours) at 02/11/2020 0925 Last data filed at 02/11/2020 0900 Gross per 24 hour  Intake 2056.4 ml  Output 3395 ml  Net -1338.6 ml   Filed Weights   02/09/20 0500 02/10/20 0500 02/11/20 0500  Weight: 126 kg 124.9 kg  124.6 kg    Examination:  GEN: ill appearing woman laying in bed in NAD HEENT:  eyes anicteric, [place]/AT Neck: Trach in place, no active bleeding CV: Regular rate and rhythm PULM: Rales bilaterally, tidal volumes 160 cc GI: obese, soft, distended but with active bowel sounds.  EXT: Peripheral edema.  Blistering wounds on right digits.  Continued swelling and bruising on left hand and digits. NEURO: Tracks, nods to answer questions.  Globally very weak.  Attempting to wiggle her toes, but not moving her upper extremities at all. PSYCH: RASS = 0 SKIN: Bruising over her left hand, bruise on her left forearm.  Pallor.  No rashes.   Resolved Hospital Problem list   MSSA pneumonia Fungal pneumonia- Candida dublinensis Enterococcal bacteremia-recent blood cultures cleared Concern for possible embolic phenomenon- fingers and possibly CVAs were embolic  Assessment & Plan:    Critically ill due to Acute hypoxic and AoC hypercapneic respiratory failure; likely underly OHS requiring VV ECMO support and mechanical ventilation.  ARDS due to COVID 19 pneumonia   Tracheostomy in place; previously had bleeding -Continue ultra lung protective ventilation.  -Continue VV ECMO support - wean sweep, -VAP prevention protocol -Not currently requiring sedation for mechanical ventilation. -Routine trach care; remove araxpack hemostatic dressing today -Antimicrobials per ID  Elevated LDH, hypo-fibrinogenemia and thrombocytopenia--improving since circuit change. -Continue to monitor  ICH, multifocal. L-sided weakness.  Critical illness myopathy -Continue PT, OT  Acute metabolic encephalopathy,  improved - IV Sedation requirements minimal; only PRN now -Seroquel and melatonin nightly -Delirium precautions   Acute renal failure, severe uremia.  Anuric. -Continue CRRT with net fluid removal  -Renally dose medications -Strict I/os  Shock related to  RV strain - now resolved, patient is now  hypertension  - Will decrease midodrine.  - If still hypertensive off midodrine, would initiate betablocker as agent of choice to decrease CO and improve oxygenation.   Hyperglycemia > not controlled at all with [place] insulin despite aggressive upward titration.  Assume poor absorption, potentially due to subcutaneous edema.   -Remains elevated on IV.  Best practice:  Diet: tube feeding Pain/Anxiety/Delirium protocol (if indicated): as above VAP protocol (if indicated): yes DVT prophylaxis: SCDs , heparin on hold for trach oozing GI prophylaxis: pantoprazole Glucose control:  insulin infusion  Mobility: bed rest Code Status: limited Family Communication: Husband at bedside Disposition: ICU  This patient is critically ill with multiple organ system failure which requires frequent high complexity decision making, assessment, support, evaluation, and titration of therapies. This was completed through the application of advanced monitoring technologies and extensive interpretation of multiple databases. During this encounter critical care time was devoted to patient care services described in this note for 35 minutes.  Kipp Brood, MD 02/11/20 9:25 AM [facility]

## 2020-02-11 NOTE — Progress Notes (Signed)
Assisted tele visit to patient with husband.  Yasser Hepp eLink RN

## 2020-02-11 NOTE — Procedures (Signed)
Extracorporeal support note   ECLS support day: 24 (cannulated 01/16/2020) Indication: Covid 19 pneumonia/ARDS  Configuration: VV  Drainage cannula: right IJ [place]  Return cannula: same  Pump speed: 4000 Pump flow: 4.83 Pump used: Centrimag  Oxygenator: Quadrox O2 blender: 100% Sweep gas: 10  ABG    Component Value Date/Time   PHART 7.405 02/11/2020 0333   PCO2ART 40.5 02/11/2020 0333   PO2ART 64 (L) 02/11/2020 0333   HCO3 25.3 02/11/2020 0333   TCO2 26 02/11/2020 0333   ACIDBASEDEF 1.0 02/09/2020 1611   O2SAT 92.0 02/11/2020 0333    Circuit check: no visible clot Anticoagulant: Continue heparin Anticoagulation targets: heparin fixed dose 500 units per hour  Changes in support: Wean sweep.   Anticipated goals/duration of support: bridge to recovery. Showing signs of lung recovery.  Kipp Brood, MD 02/11/20 9:48 AM  [facility]

## 2020-02-11 NOTE — Progress Notes (Signed)
[facility] ROUNDING NOTE   Subjective:   This is a 54 year old lady with history of diabetes obesity presented with acute hypoxic respiratory failure due to COVID-19 infection.  Hospitalization has been complicated by severe shock refractory hypoxia requiring ECMO and multiple infections, intracranial hemorrhage and encephalopathy.  Baseline creatinine appears to be about 0.7 mg/dL.  She developed acute shock secondary to acute tubular necrosis and started on CRRT 01/28/2020.  Status post percutaneous tracheostomy with bronchoscopic guidance 02/05/2020  Blood pressure 120/44 pulse 91 temperature 98 O2 sats 93% FiO2 40%   slight negative balance removing 50 -  100 cc an hour.  Anuric  Sodium 138 potassium 4 chloride 101 CO2 23 BUN 28 creatinine 1.06 glucose 172 calcium 8.5 phosphorus 2.7 magnesium 2.7 hemoglobin 8.8  Protonix 40 mg daily, oxycodone 10 mg every 6 hours, Tradjenta 5 mg daily, Reglan 5 mg every 12 hours midodrine 5 mg 3 times daily, quetiapine 50 mg nightly, Reglan 5 mg every 12 hours  Eraxis 200 mg daily, IV Zosyn 4.5 g every 6 hours, IV heparin  Objective:  Vital signs in last 24 hours:  Temp:  [98 F (36.7 C)-99.2 F (37.3 C)] 98 F (36.7 C) (09/05 0300) Pulse Rate:  [82-104] 82 (09/05 0500) Resp:  [18-30] 18 (09/05 0414) BP: (110-145)/(41-50) 110/41 (09/05 0414) SpO2:  [80 %-96 %] 96 % (09/05 0500) Arterial Line BP: (109-141)/(40-52) 111/41 (09/05 0500) FiO2 (%):  [40 %] 40 % (09/05 0414)  Weight change:  Filed Weights   02/08/20 0530 02/09/20 0500 02/10/20 0500  Weight: 130.6 kg 126 kg 124.9 kg    Intake/Output: I/O last 3 completed shifts: In: 2768.9 [I.V.:648.9; NG/GT:1016; IV Piggyback:1104.1] Out: 5574 [Emesis/NG output:600; JWJXB:1478; Stool:400]   Intake/Output this shift:  Total I/O In: 798.4 [I.V.:173.4; NG/GT:525; IV Piggyback:100] Out: 1407 [Emesis/NG output:600; GNFAO:130; Stool:50]  General: Critically ill female, has ECMO  running.  Not much improvement. Heart:RRR, s1s2 nl Lungs: Coarse breath sound bilateral Abdomen:soft, non-distended Extremities: Anasarca and edema improving. Dialysis Access: Temporary HD catheter   Basic Metabolic Panel: Recent Labs  Lab 02/08/20 0414 02/08/20 0513 02/09/20 0341 02/09/20 0352 02/09/20 1604 02/09/20 1611 02/10/20 0322 02/10/20 0353 02/10/20 1701 02/10/20 1710 02/10/20 2000 02/11/20 0322 02/11/20 0333  NA 139   < > 137   < > 136   < > 135   < > 138 139 139 138 138  K 4.0   < > 4.1   < > 4.3   < > 4.2   < > 4.2 4.1 4.3 4.0 4.0  CL 104   < > 102  --  102  --  102  --  104  --   --  101  --   CO2 24   < > 24  --  22  --  22  --  24  --   --  23  --   GLUCOSE 135*   < > 215*  --  162*  --  138*  --  186*  --   --  172*  --   BUN 34*   < > 33*  --  30*  --  25*  --  25*  --   --  28*  --   CREATININE 0.80   < > 0.94  --  1.02*  --  1.02*  --  1.09*  --   --  1.06*  --   CALCIUM 8.0*   < > 8.1*   < > 8.4*   < >  8.5*  --  8.4*  --   --  8.5*  --   MG 2.4  --  2.8*  --  2.7*  --  2.7*  --   --   --   --  2.7*  --   PHOS 3.1  --  2.2*  --  4.4  --  2.5  --   --   --   --  2.7  --    < > = values in this interval not displayed.    Liver Function Tests: Recent Labs  Lab 02/05/20 0304  AST 73*  ALT 151*  ALKPHOS 264*  BILITOT 1.6*  PROT 5.7*  ALBUMIN 2.1*   No results for input(s): LIPASE, AMYLASE in the last 168 hours. No results for input(s): AMMONIA in the last 168 hours.  CBC: Recent Labs  Lab 02/07/20 0311 02/07/20 0326 02/09/20 0341 02/09/20 0352 02/09/20 1604 02/09/20 1611 02/10/20 0322 02/10/20 0353 02/10/20 1701 02/10/20 1710 02/10/20 2000 02/11/20 0322 02/11/20 0333  WBC 13.6*   < > 16.4*  --  17.5*  --  14.9*  --  21.2*  --   --  17.4*  --   NEUTROABS 10.3*  --   --   --   --   --   --   --   --   --   --   --   --   HGB 8.7*   < > 8.7*   < > 8.6*   < > 8.0*   < > 8.2* 9.2* 8.8* 8.0* 8.8*  HCT 27.7*   < > 28.0*   < > 28.5*   < >  26.6*   < > 27.5* 27.0* 26.0* 26.9* 26.0*  MCV 102.2*   < > 105.7*  --  107.1*  --  108.1*  --  109.6*  --   --  108.9*  --   PLT 69*   < > 111*  --  104*  --  105*  --  121*  --   --  114*  --    < > = values in this interval not displayed.    Cardiac Enzymes: No results for input(s): CKTOTAL, CKMB, CKMBINDEX, TROPONINI in the last 168 hours.  BNP: Invalid input(s): POCBNP  CBG: Recent Labs  Lab 02/10/20 2302 02/10/20 2359 02/11/20 0247 02/11/20 0333 02/11/20 0433  GLUCAP 187* 179* 153* 147* 147*    Microbiology: Results for orders placed or performed during the hospital encounter of 01/20/2020  Blood Culture (routine x 2)     Status: None   Collection Time: 02/06/2020 10:20 AM   Specimen: BLOOD  Result Value Ref Range Status   Specimen Description   Final    BLOOD RIGHT ANTECUBITAL Performed at [facility], [address] [place], [place] [address]    Special Requests   Final    BOTTLES DRAWN AEROBIC AND ANAEROBIC Blood Culture adequate volume Performed at [facility], [address], [place], [place] [address]    Culture   Final    NO GROWTH 5 DAYS Performed at [facility], [place] [address] [place], [place] [address]    Report Status 01/13/2020 FINAL  Final  Blood Culture (routine x 2)     Status: None   Collection Time: 01/22/2020 11:19 AM   Specimen: BLOOD  Result Value Ref Range Status   Specimen Description   Final    BLOOD BLOOD RIGHT FOREARM Performed  at [facility], [address] [place], [place] [address]    Special Requests   Final    BOTTLES DRAWN AEROBIC ONLY Blood Culture results may not be optimal due to an inadequate volume of blood received in culture bottles Performed at [facility], [address] [place], [place] [address]    Culture   Final    NO GROWTH 5 DAYS Performed at [facility], [name] [address], [place], [place] [address]    Report Status 01/13/2020 FINAL  Final  SARS  Coronavirus 2 by RT PCR (hospital order, performed in [facility] hospital lab) Nasopharyngeal Nasopharyngeal Swab     Status: Abnormal   Collection Time: 02/01/2020 11:19 AM   Specimen: Nasopharyngeal Swab  Result Value Ref Range Status   SARS Coronavirus 2 POSITIVE (A) NEGATIVE Final    Comment: RESULT CALLED TO, READ BACK BY AND VERIFIED WITH:  SIMMS MARVA, RN @ 1624 ON 02/05/2020, CABELLERO.P (NOTE) SARS-CoV-2 target nucleic acids are DETECTED  SARS-CoV-2 RNA is generally detectable in upper respiratory specimens  during the acute phase of infection.  Positive results are indicative  of the presence of the identified virus, but do not rule out bacterial infection or co-infection with other pathogens not detected by the test.  Clinical correlation with patient history and  other diagnostic information is necessary to determine patient infection status.  The expected result is negative.  Fact Sheet for Patients:   StrictlyIdeas.no   Fact Sheet for Healthcare Providers:   BankingDealers.co.za    This test is not yet approved or cleared by the [place] FDA and  has been authorized for detection and/or diagnosis of SARS-CoV-2 by FDA under an Emergency Use Authorization (EUA).  This EUA will remain in effect  (meaning this test can be used) for the duration of  the COVID-19 declaration under Section 564(b)(1) of the Act, 21 U.S.C. section 360-bbb-3(b)(1), unless the authorization is terminated or revoked sooner.  Performed at [facility], [address] [place], [place] [address]   Culture, respiratory (non-expectorated)     Status: None   Collection Time: 01/10/20  8:32 AM   Specimen: Tracheal Aspirate; Respiratory  Result Value Ref Range Status   Specimen Description   Final    TRACHEAL ASPIRATE Performed at [place] [address], [place], [place] [address]    Special Requests   Final     NONE Performed at [facility], [place] [address], [place], [place] [address]    Gram Stain NO WBC SEEN NO ORGANISMS SEEN   Final   Culture   Final    FEW Consistent with normal respiratory flora. No Pseudomonas species isolated Performed at [place] [address] [place], [place] [address]    Report Status 01/12/2020 FINAL  Final  MRSA PCR Screening     Status: None   Collection Time: 01/10/20  6:11 PM   Specimen: Nasal Mucosa; Nasopharyngeal  Result Value Ref Range Status   MRSA by PCR NEGATIVE NEGATIVE Final    Comment:        The GeneXpert MRSA Assay (FDA approved for NASAL specimens only), is one component of a comprehensive MRSA colonization surveillance program. It is not intended to diagnose MRSA infection nor to guide or monitor treatment for MRSA infections. Performed at [facility], [place] [address], [place], [place] [address]   Culture, Urine  Status: None   Collection Time: 01/10/20  6:11 PM   Specimen: Urine, Random  Result Value Ref Range Status   Specimen Description   Final    URINE, RANDOM Performed at [place] [address], [place], [place] [address]    Special Requests   Final    NONE Performed at [facility], [place] [address], [place], [place] [address]    Culture   Final    NO GROWTH Performed at [facility], [place] [address], [place], [place] [address]    Report Status 01/11/2020 FINAL  Final  Culture, respiratory (non-expectorated)     Status: None   Collection Time: 01/18/20  8:44 AM   Specimen: Tracheal Aspirate; Respiratory  Result Value Ref Range Status   Specimen Description TRACHEAL ASPIRATE  Final   Special Requests NONE  Final   Gram Stain   Final    NO WBC SEEN FEW GRAM POSITIVE COCCI IN PAIRS IN CLUSTERS Performed at [facility], [address], [place], [place] [address]    Culture ABUNDANT STAPHYLOCOCCUS AUREUS  Final    Report Status 01/20/2020 FINAL  Final   Organism ID, Bacteria STAPHYLOCOCCUS AUREUS  Final      Susceptibility   Staphylococcus aureus - MIC*    CIPROFLOXACIN <=0.5 SENSITIVE Sensitive     ERYTHROMYCIN >=8 RESISTANT Resistant     GENTAMICIN <=0.5 SENSITIVE Sensitive     OXACILLIN <=0.25 SENSITIVE Sensitive     TETRACYCLINE <=1 SENSITIVE Sensitive     VANCOMYCIN <=0.5 SENSITIVE Sensitive     TRIMETH/SULFA <=10 SENSITIVE Sensitive     CLINDAMYCIN RESISTANT Resistant     RIFAMPIN <=0.5 SENSITIVE Sensitive     Inducible Clindamycin POSITIVE Resistant     * ABUNDANT STAPHYLOCOCCUS AUREUS  Culture, blood (Routine X 2) w Reflex to ID Panel     Status: Abnormal   Collection Time: 01/18/20 10:17 AM   Specimen: BLOOD  Result Value Ref Range Status   Specimen Description BLOOD LEFT ANTECUBITAL  Final   Special Requests   Final    BOTTLES DRAWN AEROBIC ONLY Blood Culture adequate volume   Culture  Setup Time   Final    AEROBIC BOTTLE ONLY GRAM POSITIVE COCCI Organism ID to follow CRITICAL RESULT CALLED TO, READ BACK BY AND VERIFIED WITH: Denton Brick [facility] 01/19/20 0044 JDW Performed at [facility], [address], [place], [place] [address]    Culture ENTEROCOCCUS FAECALIS (A)  Final   Report Status 01/20/2020 FINAL  Final   Organism ID, Bacteria ENTEROCOCCUS FAECALIS  Final      Susceptibility   Enterococcus faecalis - MIC*    AMPICILLIN <=2 SENSITIVE Sensitive     VANCOMYCIN 1 SENSITIVE Sensitive     GENTAMICIN SYNERGY SENSITIVE Sensitive     * ENTEROCOCCUS FAECALIS  Blood Culture ID Panel (Reflexed)     Status: Abnormal   Collection Time: 01/18/20 10:17 AM  Result Value Ref Range Status   Enterococcus faecalis DETECTED (A) NOT DETECTED Final    Comment: CRITICAL RESULT CALLED TO, READ BACK BY AND VERIFIED WITH: G ABBOTT PHARMD 01/19/20 0044 JDW    Enterococcus Faecium NOT DETECTED NOT DETECTED Final   Listeria monocytogenes NOT DETECTED NOT DETECTED Final   Staphylococcus  species NOT DETECTED NOT DETECTED Final   Staphylococcus aureus (BCID) NOT DETECTED NOT DETECTED Final   Staphylococcus epidermidis NOT DETECTED NOT DETECTED Final   Staphylococcus lugdunensis NOT DETECTED NOT DETECTED Final  Streptococcus species NOT DETECTED NOT DETECTED Final   Streptococcus agalactiae NOT DETECTED NOT DETECTED Final   Streptococcus pneumoniae NOT DETECTED NOT DETECTED Final   Streptococcus pyogenes NOT DETECTED NOT DETECTED Final   A.calcoaceticus-baumannii NOT DETECTED NOT DETECTED Final   Bacteroides fragilis NOT DETECTED NOT DETECTED Final   Enterobacterales NOT DETECTED NOT DETECTED Final   Enterobacter cloacae complex NOT DETECTED NOT DETECTED Final   Escherichia coli NOT DETECTED NOT DETECTED Final   Klebsiella aerogenes NOT DETECTED NOT DETECTED Final   Klebsiella oxytoca NOT DETECTED NOT DETECTED Final   Klebsiella pneumoniae NOT DETECTED NOT DETECTED Final   Proteus species NOT DETECTED NOT DETECTED Final   Salmonella species NOT DETECTED NOT DETECTED Final   Serratia marcescens NOT DETECTED NOT DETECTED Final   Haemophilus influenzae NOT DETECTED NOT DETECTED Final   Neisseria meningitidis NOT DETECTED NOT DETECTED Final   Pseudomonas aeruginosa NOT DETECTED NOT DETECTED Final   Stenotrophomonas maltophilia NOT DETECTED NOT DETECTED Final   Candida albicans NOT DETECTED NOT DETECTED Final   Candida auris NOT DETECTED NOT DETECTED Final   Candida glabrata NOT DETECTED NOT DETECTED Final   Candida krusei NOT DETECTED NOT DETECTED Final   Candida parapsilosis NOT DETECTED NOT DETECTED Final   Candida tropicalis NOT DETECTED NOT DETECTED Final   Cryptococcus neoformans/gattii NOT DETECTED NOT DETECTED Final   Vancomycin resistance NOT DETECTED NOT DETECTED Final    Comment: Performed at [facility], [address] [place] [address], [place], [place] [address]  Culture, blood (Routine X 2) w Reflex to ID Panel     Status: Abnormal   Collection Time: 01/18/20 10:30 AM    Specimen: BLOOD LEFT ARM  Result Value Ref Range Status   Specimen Description BLOOD LEFT ARM  Final   Special Requests   Final    BOTTLES DRAWN AEROBIC ONLY Blood Culture adequate volume   Culture  Setup Time   Final    AEROBIC BOTTLE ONLY GRAM POSITIVE COCCI CRITICAL VALUE NOTED.  VALUE IS CONSISTENT WITH PREVIOUSLY REPORTED AND CALLED VALUE.    Culture (A)  Final    ENTEROCOCCUS FAECALIS SUSCEPTIBILITIES PERFORMED ON PREVIOUS CULTURE WITHIN THE LAST 5 DAYS. Performed at [facility], [place] [address], [place], [place] [address]    Report Status 01/20/2020 FINAL  Final  Culture, blood (routine x 2)     Status: None   Collection Time: 01/21/20  6:07 PM   Specimen: BLOOD LEFT HAND  Result Value Ref Range Status   Specimen Description BLOOD LEFT HAND  Final   Special Requests   Final    BOTTLES DRAWN AEROBIC AND ANAEROBIC Blood Culture adequate volume   Culture   Final    NO GROWTH 5 DAYS Performed at [facility], [place] [address], [place], [place] [address]    Report Status 01/26/2020 FINAL  Final  Culture, blood (routine x 2)     Status: None   Collection Time: 01/21/20  6:14 PM   Specimen: BLOOD LEFT FOREARM  Result Value Ref Range Status   Specimen Description BLOOD LEFT FOREARM  Final   Special Requests   Final    BOTTLES DRAWN AEROBIC ONLY Blood Culture adequate volume   Culture   Final    NO GROWTH 5 DAYS Performed at [facility], [place] [address] [place], [place] [address]    Report Status 01/26/2020 FINAL  Final  Culture, blood (Routine X 2) w Reflex to ID Panel  Status: None   Collection Time: 01/25/20  1:15 PM   Specimen: BLOOD  Result Value Ref Range Status   Specimen Description BLOOD LEFT ANTECUBITAL  Final   Special Requests   Final    BOTTLES DRAWN AEROBIC AND ANAEROBIC Blood Culture adequate volume   Culture   Final    NO GROWTH 5 DAYS Performed at [facility], [address], [place], [place] [address]    Report Status  01/30/2020 FINAL  Final  Culture, blood (Routine X 2) w Reflex to ID Panel     Status: None   Collection Time: 01/25/20  1:23 PM   Specimen: BLOOD LEFT FOREARM  Result Value Ref Range Status   Specimen Description BLOOD LEFT FOREARM  Final   Special Requests   Final    BOTTLES DRAWN AEROBIC AND ANAEROBIC Blood Culture adequate volume   Culture   Final    NO GROWTH 5 DAYS Performed at [facility], [place] [address] [place], [place] [address]    Report Status 01/30/2020 FINAL  Final  SARS Coronavirus 2 by RT PCR (hospital order, performed in [facility] hospital lab) Nasopharyngeal     Status: None   Collection Time: 01/29/20  9:00 AM   Specimen: Nasopharyngeal  Result Value Ref Range Status   SARS Coronavirus 2 NEGATIVE NEGATIVE Final    Comment: (NOTE) SARS-CoV-2 target nucleic acids are NOT DETECTED.  The SARS-CoV-2 RNA is generally detectable in upper and lower respiratory specimens during the acute phase of infection. The lowest concentration of SARS-CoV-2 viral copies this assay can detect is 250 copies / mL. A negative result does not preclude SARS-CoV-2 infection and should not be used as the sole basis for treatment or other patient management decisions.  A negative result may occur with improper specimen collection / handling, submission of specimen other than nasopharyngeal swab, presence of viral mutation(s) within the areas targeted by this assay, and inadequate number of viral copies (<250 copies / mL). A negative result must be combined with clinical observations, patient history, and epidemiological information.  Fact Sheet for Patients:   StrictlyIdeas.no  Fact Sheet for Healthcare Providers: BankingDealers.co.za  This test is not yet approved or  cleared by the [place] FDA and has been authorized for detection and/or diagnosis of SARS-CoV-2 by FDA under an Emergency Use Authorization (EUA).  This EUA  will remain in effect (meaning this test can be used) for the duration of the COVID-19 declaration under Section 564(b)(1) of the Act, 21 U.S.C. section 360bbb-3(b)(1), unless the authorization is terminated or revoked sooner.  Performed at [facility], [place] [address], [place], [place] [address]   Culture, bal-quantitative     Status: Abnormal   Collection Time: 01/30/20 10:36 AM   Specimen: Bronchial Alveolar Lavage; Respiratory  Result Value Ref Range Status   Specimen Description Bronch Lavag  Final   Special Requests NONE  Final   Gram Stain   Final    ABUNDANT WBC PRESENT,BOTH PMN AND MONONUCLEAR NO ORGANISMS SEEN Performed at [facility], [address], [place], [place] [address]    Culture 10,000 COLONIES/mL CANDIDA DUBLINIENSIS (A)  Final   Report Status 02/02/2020 FINAL  Final  Culture, blood (routine x 2)     Status: None   Collection Time: 01/31/20  5:36 PM   Specimen: BLOOD RIGHT HAND  Result Value Ref Range Status   Specimen Description BLOOD RIGHT HAND  Final   Special Requests  Final    BOTTLES DRAWN AEROBIC ONLY Blood Culture results may not be optimal due to an inadequate volume of blood received in culture bottles   Culture   Final    NO GROWTH 5 DAYS Performed at [facility], [place] [address], [place], [place] [address]    Report Status 02/05/2020 FINAL  Final  Culture, blood (routine x 2)     Status: None   Collection Time: 01/31/20  5:50 PM   Specimen: BLOOD RIGHT HAND  Result Value Ref Range Status   Specimen Description BLOOD RIGHT HAND  Final   Special Requests   Final    BOTTLES DRAWN AEROBIC ONLY Blood Culture results may not be optimal due to an inadequate volume of blood received in culture bottles   Culture   Final    NO GROWTH 5 DAYS Performed at [facility], [place] [address], [place], [place] [address]    Report Status 02/05/2020 FINAL  Final    Coagulation Studies: No results for input(s): LABPROT, INR in the  last 72 hours.  Urinalysis: No results for input(s): COLORURINE, LABSPEC, PHURINE, GLUCOSEU, HGBUR, BILIRUBINUR, KETONESUR, PROTEINUR, UROBILINOGEN, NITRITE, LEUKOCYTESUR in the last 72 hours.  Invalid input(s): APPERANCEUR    Imaging: DG Abd 1 View  Result Date: 02/09/2020 CLINICAL DATA:  NG placement EXAM: ABDOMEN - 1 VIEW COMPARISON:  Chest 02/09/2020 FINDINGS: Feeding tube enters the stomach with the tip in the region of the antrum or duodenal bulb. Normal bowel gas pattern Large bore vascular catheter in the right atrium/distal IVC unchanged from chest x-ray. IMPRESSION: Feeding tube tip in the region the gastric antrum or duodenal bulb. Normal bowel gas pattern. Electronically Signed   By: Franchot Gallo M.D.   On: 02/09/2020 12:08   DG CHEST PORT 1 VIEW  Result Date: 02/10/2020 CLINICAL DATA:  ECMO EXAM: PORTABLE CHEST 1 VIEW COMPARISON:  Yesterday FINDINGS: Tracheostomy tube in place. Left subclavian line with tip at the subclavian level. Feeding tube at least reaches the diaphragm. ECMO catheter in similar position. The ECMO catheter obscures the tip of the right PICC. Airless lungs. Obscured heart size. No visible air leak. IMPRESSION: 1. Stable hardware positioning. 2. Airless lungs. Electronically Signed   By: Monte Fantasia M.D.   On: 02/10/2020 08:47   DG Abd Portable 1V  Result Date: 02/09/2020 CLINICAL DATA:  Feeding tube placement. EXAM: PORTABLE ABDOMEN - 1 VIEW COMPARISON:  02/09/2020 FINDINGS: Feeding tube is been advanced into the duodenum with the tip in the second portion of duodenum. Central venous large-bore catheter extending into the distal IVC unchanged. Severe airspace disease bilaterally. No dilated bowel loops. IMPRESSION: Feeding tube has been advanced with the tip in the second portion the duodenum. Electronically Signed   By: Franchot Gallo M.D.   On: 02/09/2020 14:43     Medications:   .  prismasol BGK 4/2.5 300 mL/hr at 02/11/20 0514  . sodium chloride 250  mL (02/09/20 1130)  . sodium chloride 10 mL/hr at 02/11/20 0500  . albumin human 12.5 g (02/10/20 1521)  . albumin human Stopped (02/06/20 2000)  . dexmedetomidine (PRECEDEX) IV infusion Stopped (02/09/20 0616)  . feeding supplement (PIVOT 1.5 CAL) 75 mL/hr at 02/11/20 0200  . heparin 500 Units/hr (02/11/20 0500)  . insulin 1.9 mL/hr at 02/11/20 0500  . milrinone Stopped (02/05/20 0905)  . norepinephrine (LEVOPHED) Adult infusion Stopped (02/09/20 0728)  . piperacillin-tazobactam (ZOSYN)  IV 4.5 g (02/11/20 0519)  . prismasol BGK 2/2.5  replacement solution 500 mL/hr at 02/10/20 2109  . prismasol BGK 4/2.5 2,000 mL/hr at 02/11/20 0514  . vasopressin Stopped (02/06/20 1741)   . sodium chloride   Intravenous Once  . sodium chloride   Intravenous Once  . amiodarone  200 mg Per Tube Daily  . vitamin C  500 mg Per Tube Daily  . B-complex with vitamin C  1 tablet Oral Daily  . chlorhexidine gluconate (MEDLINE KIT)  15 mL Mouth Rinse BID  . Chlorhexidine Gluconate Cloth  6 each Topical Daily  . clonazePAM  1 mg Per Tube Q6H  . docusate  100 mg Per Tube BID  . feeding supplement (PROSource TF)  45 mL Per Tube TID  . fentaNYL (SUBLIMAZE) injection  50 mcg Intravenous Once  . linagliptin  5 mg Per Tube Daily  . mouth rinse  15 mL Mouth Rinse 10 times per day  . melatonin  9 mg Per Tube QHS  . metoCLOPramide (REGLAN) injection  5 mg Intravenous Q12H  . midodrine  5 mg Per Tube TID WC  . oxyCODONE  10 mg Per Tube Q6H  . pantoprazole (PROTONIX) IV  40 mg Intravenous BID  . polyethylene glycol  17 g Per Tube Daily  . QUEtiapine  50 mg Per Tube QHS  . sennosides  5 mL Per Tube BID  . zinc sulfate  220 mg Per Tube Daily   sodium chloride, sodium chloride, acetaminophen (TYLENOL) oral liquid 160 mg/5 mL, albumin human, albuterol, dextrose, fentaNYL, fentaNYL (SUBLIMAZE) injection, guaiFENesin-dextromethorphan, heparin, hydrALAZINE, HYDROmorphone (DILAUDID) injection, hydrOXYzine, ondansetron  **OR** ondansetron (ZOFRAN) IV, sodium chloride  Assessment/ Plan:  1.  Anuric AKI due to shock, sepsis causing acute tubular necrosis: Started CRRT on 8/22 for uremia and volume overload. She continues on ECMO. Continue CRRT, prognosis grim.  Now removing about 50 cc-100 an hour.  May be able to tolerate intermittent hemodialysis after the weekend.  We will continue fluid removal with CRRT during weekend.  If tolerated  2.Shock due to sepsis, Covid infection, blood cultures positive for Enterococcus bacteremia: Patient is off pressors.  3.Acute hypoxic and hypercapnic respiratory failure, ARDS due to COVID-19 pneumonia: Currently on ECMO for refractory hypoxemia   On broad-spectrum antibiotics.  IV Zosyn and IV Eraxis appreciate assistance from infectious disease.  Status post tracheostomy 04/06/2020.  Respiratory cultures have been positive for MSSA and Candida Dublinensis.  3.Hypernatremia, hypervolemia: relative free water deficit.  Improved with CRRT.  4.Acute toxic metabolic encephalopathy:Multifactorial with uremia contributing. Treatment with CRRT as above.  5.Anemia, thrombocytopenia: With hemolysis likely and acute illness contributing. Transfusions per primary team.  Seen by hematologist.  6.Intracranial hemorrhage: With some left-sided weakness preceded by primary team.   7. GOC: Multiorgan failure.   family meeting on 8/25, continue full scope of treatment.  Long-term prognosis poor.  8.  Diabetes as per primary service continues on oral Tradjenta.  9.  Hypophosphatemia improved    LOS: [place] _0 _1 :18 AM

## 2020-02-11 NOTE — Progress Notes (Addendum)
At [place], filter clotted on CRRT.  While in the process of changing the filter, pt. Started vomitting a large amount of green, bile.   Suctioned 600cc,  and large amount of emesis vomitted on bed.  Placed tube feedings on hold at this time.  Will re-evaluate before restarting.

## 2020-02-11 NOTE — Plan of Care (Signed)
Problem: Education: Goal: Knowledge of General Education information will improve Description: Including pain rating scale, medication(s)/side effects and non-pharmacologic comfort measures Outcome: Progressing   Problem: Health Behavior/Discharge Planning: Goal: Ability to manage health-related needs will improve Outcome: Progressing   Problem: Clinical Measurements: Goal: Ability to maintain clinical measurements within normal limits will improve Outcome: Progressing Goal: Will remain free from infection Outcome: Progressing Goal: Diagnostic test results will improve Outcome: Progressing Goal: Respiratory complications will improve Outcome: Progressing Goal: Cardiovascular complication will be avoided Outcome: Progressing   Problem: Activity: Goal: Risk for activity intolerance will decrease Outcome: Progressing   Problem: Nutrition: Goal: Adequate nutrition will be maintained Outcome: Progressing   Problem: Coping: Goal: Level of anxiety will decrease Outcome: Progressing   Problem: Elimination: Goal: Will not experience complications related to bowel motility Outcome: Progressing Goal: Will not experience complications related to urinary retention Outcome: Progressing   Problem: Pain Managment: Goal: General experience of comfort will improve Outcome: Progressing   Problem: Safety: Goal: Ability to remain free from injury will improve Outcome: Progressing   Problem: Skin Integrity: Goal: Risk for impaired skin integrity will decrease Outcome: Progressing   Problem: Education: Goal: Knowledge of risk factors and measures for prevention of condition will improve Outcome: Progressing   Problem: Coping: Goal: Psychosocial and spiritual needs will be supported Outcome: Progressing   Problem: Respiratory: Goal: Will maintain a patent airway Outcome: Progressing Goal: Complications related to the disease process, condition or treatment will be avoided or  minimized Outcome: Progressing   Problem: Education: Goal: Knowledge of risk factors and measures for prevention of condition will improve Outcome: Progressing   Problem: Coping: Goal: Psychosocial and spiritual needs will be supported Outcome: Progressing   Problem: Respiratory: Goal: Will maintain a patent airway Outcome: Progressing Goal: Complications related to the disease process, condition or treatment will be avoided or minimized Outcome: Progressing   Problem: Education: Goal: Ability to describe self-care measures that may prevent or decrease complications (Diabetes Survival Skills Education) will improve Outcome: Progressing Goal: Individualized Educational Video(s) Outcome: Progressing   Problem: Coping: Goal: Ability to adjust to condition or change in health will improve Outcome: Progressing   Problem: Fluid Volume: Goal: Ability to maintain a balanced intake and output will improve Outcome: Progressing   Problem: Health Behavior/Discharge Planning: Goal: Ability to identify and utilize available resources and services will improve Outcome: Progressing Goal: Ability to manage health-related needs will improve Outcome: Progressing   Problem: Metabolic: Goal: Ability to maintain appropriate glucose levels will improve Outcome: Progressing   Problem: Nutritional: Goal: Maintenance of adequate nutrition will improve Outcome: Progressing Goal: Progress toward achieving an optimal weight will improve Outcome: Progressing   Problem: Skin Integrity: Goal: Risk for impaired skin integrity will decrease Outcome: Progressing   Problem: Tissue Perfusion: Goal: Adequacy of tissue perfusion will improve Outcome: Progressing   Problem: Education: Goal: Knowledge of risk factors and measures for prevention of condition will improve Outcome: Progressing   Problem: Coping: Goal: Psychosocial and spiritual needs will be supported Outcome: Progressing    Problem: Respiratory: Goal: Will maintain a patent airway Outcome: Progressing Goal: Complications related to the disease process, condition or treatment will be avoided or minimized Outcome: Progressing   Problem: Education: Goal: Knowledge of disease or condition will improve Outcome: Progressing Goal: Knowledge of secondary prevention will improve Outcome: Progressing Goal: Knowledge of patient specific risk factors addressed and post discharge goals established will improve Outcome: Progressing Goal: Individualized Educational Video(s) Outcome: Progressing   Problem: Coping: Goal: Will verbalize  positive feelings about self Outcome: Progressing Goal: Will identify appropriate support needs Outcome: Progressing   Problem: Health Behavior/Discharge Planning: Goal: Ability to manage health-related needs will improve Outcome: Progressing   Problem: Self-Care: Goal: Ability to participate in self-care as condition permits will improve Outcome: Progressing Goal: Verbalization of feelings and concerns over difficulty with self-care will improve Outcome: Progressing Goal: Ability to communicate needs accurately will improve Outcome: Progressing   Problem: Nutrition: Goal: Risk of aspiration will decrease Outcome: Progressing Goal: Dietary intake will improve Outcome: Progressing   Problem: Intracerebral Hemorrhage Tissue Perfusion: Goal: Complications of Intracerebral Hemorrhage will be minimized Outcome: Progressing   Problem: Ischemic Stroke/TIA Tissue Perfusion: Goal: Complications of ischemic stroke/TIA will be minimized Outcome: Progressing   Problem: Spontaneous Subarachnoid Hemorrhage Tissue Perfusion: Goal: Complications of Spontaneous Subarachnoid Hemorrhage will be minimized Outcome: Progressing

## 2020-02-12 ENCOUNTER — Inpatient Hospital Stay (HOSPITAL_COMMUNITY): Payer: PRIVATE HEALTH INSURANCE

## 2020-02-12 LAB — POCT I-STAT 7, (LYTES, BLD GAS, ICA,H+H)
Acid-Base Excess: 0 mmol/L (ref 0.0–2.0)
Acid-Base Excess: 1 mmol/L (ref 0.0–2.0)
Acid-Base Excess: 1 mmol/L (ref 0.0–2.0)
Acid-Base Excess: 1 mmol/L (ref 0.0–2.0)
Acid-Base Excess: 1 mmol/L (ref 0.0–2.0)
Acid-Base Excess: 1 mmol/L (ref 0.0–2.0)
Acid-Base Excess: 3 mmol/L — ABNORMAL HIGH (ref 0.0–2.0)
Bicarbonate: 24.3 mmol/L (ref 20.0–28.0)
Bicarbonate: 25.7 mmol/L (ref 20.0–28.0)
Bicarbonate: 27.1 mmol/L (ref 20.0–28.0)
Bicarbonate: 28.2 mmol/L — ABNORMAL HIGH (ref 20.0–28.0)
Bicarbonate: 27.4 mmol/L (ref 20.0–28.0)
Bicarbonate: 27.1 mmol/L (ref 20.0–28.0)
Bicarbonate: 28.3 mmol/L — ABNORMAL HIGH (ref 20.0–28.0)
Calcium, Ion: 1.13 mmol/L — ABNORMAL LOW (ref 1.15–1.40)
Calcium, Ion: 1.09 mmol/L — ABNORMAL LOW (ref 1.15–1.40)
Calcium, Ion: 1.11 mmol/L — ABNORMAL LOW (ref 1.15–1.40)
Calcium, Ion: 1.15 mmol/L (ref 1.15–1.40)
Calcium, Ion: 1.13 mmol/L — ABNORMAL LOW (ref 1.15–1.40)
Calcium, Ion: 1.13 mmol/L — ABNORMAL LOW (ref 1.15–1.40)
Calcium, Ion: 1.1 mmol/L — ABNORMAL LOW (ref 1.15–1.40)
HCT: 27 % — ABNORMAL LOW (ref 36.0–46.0)
HCT: 26 % — ABNORMAL LOW (ref 36.0–46.0)
HCT: 27 % — ABNORMAL LOW (ref 36.0–46.0)
HCT: 27 % — ABNORMAL LOW (ref 36.0–46.0)
HCT: 27 % — ABNORMAL LOW (ref 36.0–46.0)
HCT: 26 % — ABNORMAL LOW (ref 36.0–46.0)
HCT: 24 % — ABNORMAL LOW (ref 36.0–46.0)
Hemoglobin: 9.2 g/dL — ABNORMAL LOW (ref 12.0–15.0)
Hemoglobin: 8.8 g/dL — ABNORMAL LOW (ref 12.0–15.0)
Hemoglobin: 9.2 g/dL — ABNORMAL LOW (ref 12.0–15.0)
Hemoglobin: 9.2 g/dL — ABNORMAL LOW (ref 12.0–15.0)
Hemoglobin: 9.2 g/dL — ABNORMAL LOW (ref 12.0–15.0)
Hemoglobin: 8.8 g/dL — ABNORMAL LOW (ref 12.0–15.0)
Hemoglobin: 8.2 g/dL — ABNORMAL LOW (ref 12.0–15.0)
O2 Saturation: 90 %
O2 Saturation: 93 %
O2 Saturation: 93 %
O2 Saturation: 95 %
O2 Saturation: 93 %
O2 Saturation: 93 %
O2 Saturation: 93 %
Patient temperature: 36.1
Patient temperature: 36.1
Patient temperature: 97.4
Patient temperature: 97.7
Patient temperature: 98.6
Potassium: 4 mmol/L (ref 3.5–5.1)
Potassium: 4.3 mmol/L (ref 3.5–5.1)
Potassium: 4.3 mmol/L (ref 3.5–5.1)
Potassium: 4.2 mmol/L (ref 3.5–5.1)
Potassium: 4 mmol/L (ref 3.5–5.1)
Potassium: 3.9 mmol/L (ref 3.5–5.1)
Potassium: 4.8 mmol/L (ref 3.5–5.1)
Sodium: 139 mmol/L (ref 135–145)
Sodium: 139 mmol/L (ref 135–145)
Sodium: 139 mmol/L (ref 135–145)
Sodium: 137 mmol/L (ref 135–145)
Sodium: 137 mmol/L (ref 135–145)
Sodium: 138 mmol/L (ref 135–145)
Sodium: 137 mmol/L (ref 135–145)
TCO2: 25 mmol/L (ref 22–32)
TCO2: 27 mmol/L (ref 22–32)
TCO2: 29 mmol/L (ref 22–32)
TCO2: 30 mmol/L (ref 22–32)
TCO2: 29 mmol/L (ref 22–32)
TCO2: 29 mmol/L (ref 22–32)
TCO2: 30 mmol/L (ref 22–32)
pCO2 arterial: 37.8 mmHg (ref 32.0–48.0)
pCO2 arterial: 39.9 mmHg (ref 32.0–48.0)
pCO2 arterial: 46.5 mmHg (ref 32.0–48.0)
pCO2 arterial: 55.9 mmHg — ABNORMAL HIGH (ref 32.0–48.0)
pCO2 arterial: 50.1 mmHg — ABNORMAL HIGH (ref 32.0–48.0)
pCO2 arterial: 49.6 mmHg — ABNORMAL HIGH (ref 32.0–48.0)
pCO2 arterial: 48.3 mmHg — ABNORMAL HIGH (ref 32.0–48.0)
pH, Arterial: 7.415 (ref 7.350–7.450)
pH, Arterial: 7.37 (ref 7.350–7.450)
pH, Arterial: 7.413 (ref 7.350–7.450)
pH, Arterial: 7.311 — ABNORMAL LOW (ref 7.350–7.450)
pH, Arterial: 7.343 — ABNORMAL LOW (ref 7.350–7.450)
pH, Arterial: 7.343 — ABNORMAL LOW (ref 7.350–7.450)
pH, Arterial: 7.376 (ref 7.350–7.450)
pO2, Arterial: 57 mmHg — ABNORMAL LOW (ref 83.0–108.0)
pO2, Arterial: 64 mmHg — ABNORMAL LOW (ref 83.0–108.0)
pO2, Arterial: 67 mmHg — ABNORMAL LOW (ref 83.0–108.0)
pO2, Arterial: 87 mmHg (ref 83.0–108.0)
pO2, Arterial: 70 mmHg — ABNORMAL LOW (ref 83.0–108.0)
pO2, Arterial: 69 mmHg — ABNORMAL LOW (ref 83.0–108.0)
pO2, Arterial: 69 mmHg — ABNORMAL LOW (ref 83.0–108.0)

## 2020-02-12 LAB — BASIC METABOLIC PANEL
Anion gap: 13 (ref 5–15)
Anion gap: 12 (ref 5–15)
BUN: 14 mg/dL (ref 6–20)
BUN: 15 mg/dL (ref 6–20)
CO2: 22 mmol/L (ref 22–32)
CO2: 24 mmol/L (ref 22–32)
Calcium: 8.5 mg/dL — ABNORMAL LOW (ref 8.9–10.3)
Calcium: 8.3 mg/dL — ABNORMAL LOW (ref 8.9–10.3)
Chloride: 102 mmol/L (ref 98–111)
Chloride: 101 mmol/L (ref 98–111)
Creatinine, Ser: 0.91 mg/dL (ref 0.44–1.00)
Creatinine, Ser: 0.92 mg/dL (ref 0.44–1.00)
GFR calc Af Amer: >60 mL/min (ref >60)
GFR calc Af Amer: >60 mL/min (ref >60)
GFR calc non Af Amer: >60 mL/min (ref >60)
GFR calc non Af Amer: >60 mL/min (ref >60)
Glucose, Bld: 126 mg/dL — ABNORMAL HIGH (ref 70–99)
Glucose, Bld: 152 mg/dL — ABNORMAL HIGH (ref 70–99)
Potassium: 4 mmol/L (ref 3.5–5.1)
Potassium: 3.9 mmol/L (ref 3.5–5.1)
Sodium: 137 mmol/L (ref 135–145)
Sodium: 137 mmol/L (ref 135–145)

## 2020-02-12 LAB — RENAL FUNCTION PANEL
Albumin: 2.5 g/dL — ABNORMAL LOW (ref 3.5–5.0)
Anion gap: 13 (ref 5–15)
BUN: 16 mg/dL (ref 6–20)
CO2: 22 mmol/L (ref 22–32)
Calcium: 8.4 mg/dL — ABNORMAL LOW (ref 8.9–10.3)
Chloride: 101 mmol/L (ref 98–111)
Creatinine, Ser: 0.96 mg/dL (ref 0.44–1.00)
GFR calc Af Amer: >60 mL/min (ref >60)
GFR calc non Af Amer: >60 mL/min (ref >60)
Glucose, Bld: 145 mg/dL — ABNORMAL HIGH (ref 70–99)
Phosphorus: 3.6 mg/dL (ref 2.5–4.6)
Potassium: 4 mmol/L (ref 3.5–5.1)
Sodium: 136 mmol/L (ref 135–145)

## 2020-02-12 LAB — CBC
HCT: 27.3 % — ABNORMAL LOW (ref 36.0–46.0)
HCT: 28.2 % — ABNORMAL LOW (ref 36.0–46.0)
HCT: 28.4 % — ABNORMAL LOW (ref 36.0–46.0)
Hemoglobin: 8.1 g/dL — ABNORMAL LOW (ref 12.0–15.0)
Hemoglobin: 8.3 g/dL — ABNORMAL LOW (ref 12.0–15.0)
Hemoglobin: 8.2 g/dL — ABNORMAL LOW (ref 12.0–15.0)
MCH: 32.5 pg (ref 26.0–34.0)
MCH: 32.9 pg (ref 26.0–34.0)
MCH: 32.4 pg (ref 26.0–34.0)
MCHC: 29.7 g/dL — ABNORMAL LOW (ref 30.0–36.0)
MCHC: 29.4 g/dL — ABNORMAL LOW (ref 30.0–36.0)
MCHC: 28.9 g/dL — ABNORMAL LOW (ref 30.0–36.0)
MCV: 109.6 fL — ABNORMAL HIGH (ref 80.0–100.0)
MCV: 111.9 fL — ABNORMAL HIGH (ref 80.0–100.0)
MCV: 112.3 fL — ABNORMAL HIGH (ref 80.0–100.0)
Platelets: 104 10*3/uL — ABNORMAL LOW (ref 150–400)
Platelets: 106 10*3/uL — ABNORMAL LOW (ref 150–400)
Platelets: 108 10*3/uL — ABNORMAL LOW (ref 150–400)
RBC: 2.49 MIL/uL — ABNORMAL LOW (ref 3.87–5.11)
RBC: 2.52 MIL/uL — ABNORMAL LOW (ref 3.87–5.11)
RBC: 2.53 MIL/uL — ABNORMAL LOW (ref 3.87–5.11)
RDW: 29.8 % — ABNORMAL HIGH (ref 11.5–15.5)
RDW: 30.1 % — ABNORMAL HIGH (ref 11.5–15.5)
RDW: 29.5 % — ABNORMAL HIGH (ref 11.5–15.5)
WBC: 13.3 10*3/uL — ABNORMAL HIGH (ref 4.0–10.5)
WBC: 15.7 10*3/uL — ABNORMAL HIGH (ref 4.0–10.5)
WBC: 16.2 10*3/uL — ABNORMAL HIGH (ref 4.0–10.5)
nRBC: 5 % — ABNORMAL HIGH (ref 0.0–0.2)
nRBC: 4.3 % — ABNORMAL HIGH (ref 0.0–0.2)
nRBC: 3.9 % — ABNORMAL HIGH (ref 0.0–0.2)

## 2020-02-12 LAB — GLUCOSE, CAPILLARY
Glucose-Capillary: 125 mg/dL — ABNORMAL HIGH (ref 70–99)
Glucose-Capillary: 132 mg/dL — ABNORMAL HIGH (ref 70–99)
Glucose-Capillary: 126 mg/dL — ABNORMAL HIGH (ref 70–99)
Glucose-Capillary: 122 mg/dL — ABNORMAL HIGH (ref 70–99)
Glucose-Capillary: 117 mg/dL — ABNORMAL HIGH (ref 70–99)
Glucose-Capillary: 124 mg/dL — ABNORMAL HIGH (ref 70–99)
Glucose-Capillary: 126 mg/dL — ABNORMAL HIGH (ref 70–99)
Glucose-Capillary: 123 mg/dL — ABNORMAL HIGH (ref 70–99)
Glucose-Capillary: 122 mg/dL — ABNORMAL HIGH (ref 70–99)
Glucose-Capillary: 126 mg/dL — ABNORMAL HIGH (ref 70–99)
Glucose-Capillary: 135 mg/dL — ABNORMAL HIGH (ref 70–99)
Glucose-Capillary: 144 mg/dL — ABNORMAL HIGH (ref 70–99)
Glucose-Capillary: 137 mg/dL — ABNORMAL HIGH (ref 70–99)
Glucose-Capillary: 149 mg/dL — ABNORMAL HIGH (ref 70–99)
Glucose-Capillary: 147 mg/dL — ABNORMAL HIGH (ref 70–99)
Glucose-Capillary: 150 mg/dL — ABNORMAL HIGH (ref 70–99)
Glucose-Capillary: 155 mg/dL — ABNORMAL HIGH (ref 70–99)
Glucose-Capillary: 161 mg/dL — ABNORMAL HIGH (ref 70–99)
Glucose-Capillary: 180 mg/dL — ABNORMAL HIGH (ref 70–99)
Glucose-Capillary: 170 mg/dL — ABNORMAL HIGH (ref 70–99)

## 2020-02-12 LAB — APTT
aPTT: 40 seconds — ABNORMAL HIGH (ref 24–36)
aPTT: 41 seconds — ABNORMAL HIGH (ref 24–36)

## 2020-02-12 LAB — PHOSPHORUS: Phosphorus: 2.5 mg/dL (ref 2.5–4.6)

## 2020-02-12 LAB — LACTIC ACID, PLASMA
Lactic Acid, Venous: 1.2 mmol/L (ref 0.5–1.9)
Lactic Acid, Venous: 1.3 mmol/L (ref 0.5–1.9)

## 2020-02-12 LAB — HEPARIN LEVEL (UNFRACTIONATED): Heparin Unfractionated: <0.1 IU/mL — ABNORMAL LOW (ref 0.30–0.70)

## 2020-02-12 LAB — MAGNESIUM: Magnesium: 2.7 mg/dL — ABNORMAL HIGH (ref 1.7–2.4)

## 2020-02-12 LAB — FIBRINOGEN: Fibrinogen: 571 mg/dL — ABNORMAL HIGH (ref 210–475)

## 2020-02-12 LAB — LACTATE DEHYDROGENASE: LDH: 489 U/L — ABNORMAL HIGH (ref 98–192)

## 2020-02-12 MED ORDER — CHLORHEXIDINE GLUCONATE CLOTH 2 % EX PADS
6.0000 | MEDICATED_PAD | Freq: Every day | CUTANEOUS | Status: DC
  Administered 2020-02-13 – 2020-03-02 (×19): 6 via TOPICAL

## 2020-02-12 MED ORDER — ALUM & MAG HYDROXIDE-SIMETH 200-200-20 MG/5ML PO SUSP
30.0000 mL | Freq: Four times a day (QID) | ORAL | Status: DC | PRN
  Administered 2020-02-12 (×2): 30 mL via ORAL
  Filled 2020-02-12 (×2): qty 30

## 2020-02-12 MED ORDER — LIDOCAINE-PRILOCAINE 2.5-2.5 % EX CREA
1.0000 "application " | TOPICAL_CREAM | CUTANEOUS | Status: DC | PRN
  Filled 2020-02-12: qty 5

## 2020-02-12 MED ORDER — SODIUM CHLORIDE 0.9 % IV SOLN
100.0000 mL | INTRAVENOUS | Status: DC | PRN
  Administered 2020-02-24: 100 mL via INTRAVENOUS

## 2020-02-12 MED ORDER — HEPARIN SODIUM (PORCINE) 1000 UNIT/ML DIALYSIS
1000.0000 [IU] | INTRAMUSCULAR | Status: DC | PRN
  Filled 2020-02-12: qty 1

## 2020-02-12 MED ORDER — PENTAFLUOROPROP-TETRAFLUOROETH EX AERO: 1.0000 "application " | INHALATION_SPRAY | CUTANEOUS | Status: DC | PRN

## 2020-02-12 MED ORDER — SODIUM CHLORIDE 0.9 % IV SOLN: 100.0000 mL | INTRAVENOUS | Status: DC | PRN

## 2020-02-12 MED ORDER — ALTEPLASE 2 MG IJ SOLR: 2.0000 mg | Freq: Once | INTRAMUSCULAR | Status: DC | PRN

## 2020-02-12 MED ORDER — PHENYLEPHRINE HCL-NACL 10-0.9 MG/250ML-% IV SOLN
0.0000 ug/min | INTRAVENOUS | Status: DC
  Administered 2020-02-12: 30 ug/min via INTRAVENOUS
  Filled 2020-02-12 (×2): qty 250

## 2020-02-12 MED ORDER — LIDOCAINE HCL (PF) 1 % IJ SOLN: 5.0000 mL | INTRAMUSCULAR | Status: DC | PRN

## 2020-02-12 NOTE — Progress Notes (Signed)
Patient ID: Alisha Stephenson, female   DOB: 1965/07/30, 54 y.o.   MRN: [id number]    Advanced Heart Failure Rounding Note   Subjective:    - 8/2 COVID + test - 8/9 Cannulated for VV ECMO - 8/13 with several areas of intracranial hemorrhage. Bival stopped.  - 8/14 CT no change in [place]. Increased edema - 8/16 Extubated - 8/16 Head CT stable bleed - 8/22 CVVHD started - 8/24 reintubated - 8/24 circuit changed - 8/30 trach  Remains awake on vent. Will follow commands. Very weak. Cannot move upper extremities. Unable to sit up   Heparin remains at 500 No further bleeding.   Remains anuric on CVVHD. Pulling 25-50/hr. Rate pulled back to chugging. Weight down 1 pound.   Off sedatives.   CXR with diffuse infiltrates. Improving.  Cor-trak looped in stomach. TFs held   ECMO   Flow 4.8 L RPM 4000 Sweep13 -> 9 -> 7  Labs:  7.42/38/57/90% Fio2 40% TV 80cc-> 120 cc -> 150cc Hgb8.1 PLT 104k LDH 450 -> 489  -> 489 Lactic acidpending   Objective:   Weight Range:  Vital Signs:   Temp:  [97.5 F (36.4 C)-98.4 F (36.9 C)] 98.4 F (36.9 C) (09/06 0805) Pulse Rate:  [77-94] 92 (09/06 0800) Resp:  [19-26] 24 (09/06 0758) BP: (115-149)/(47-72) 136/47 (09/06 0336) SpO2:  [90 %-98 %] 93 % (09/06 0800) Arterial Line BP: (129-167)/(46-57) 162/52 (09/06 0800) FiO2 (%):  [40 %] 40 % (09/06 0758) Weight:  [124 kg] 124 kg (09/06 0500) Last BM Date: 02/11/20  Weight change: Filed Weights   02/10/20 0500 02/11/20 0500 02/12/20 0500  Weight: 124.9 kg 124.6 kg 124 kg    Intake/Output:   Intake/Output Summary (Last 24 hours) at 02/12/2020 0814 Last data filed at 02/12/2020 0700 Gross per 24 hour  Intake 1080.45 ml  Output 3012 ml  Net -1931.55 ml     Physical Exam: General:  Wide awake on trach. Can move right hand and feet minimally  HEENT: normal + cortrak and NG Neck: supple. RIJ ECMO  + trach Carotids 2+ bilat; no bruits. No lymphadenopathy or thryomegaly appreciated.  [facility] HD cath Cor: PMI nondisplaced. Regular rate & rhythm. No rubs, gallops or murmurs. Lungs: clear Abdomen: obese soft, nontender, nondistended. No hepatosplenomegaly. No bruits or masses. Good bowel sounds. Extremities: no cyanosis, clubbing, rash, 1+ edema Neuro: wide awake. Can move right hand and feet minimally   Telemetry: Sinus 90s Personally reviewed   Labs: Basic Metabolic Panel: Recent Labs  Lab 02/09/20 0341 02/09/20 0352 02/09/20 1604 02/09/20 1611 02/10/20 0322 02/10/20 0353 02/10/20 1701 02/10/20 1710 02/11/20 0322 02/11/20 0333 02/11/20 1513 02/11/20 1608 02/11/20 2016 02/12/20 0405 02/12/20 0516  NA 137   < > 136   < > 135   < > 138   < > 138   < > 139 137 139 137 139  K 4.1   < > 4.3   < > 4.2   < > 4.2   < > 4.0   < > 4.1 4.1 4.5 4.0 4.0  CL 102   < > 102   < > 102  --  104  --  101  --   --  102  --  102  --   CO2 24   < > 22   < > 22  --  24  --  23  --   --  23  --  22  --   GLUCOSE 215*   < >  162*   < > 138*  --  186*  --  172*  --   --  127*  --  126*  --   BUN 33*   < > 30*   < > 25*  --  25*  --  28*  --   --  20  --  14  --   CREATININE 0.94   < > 1.02*   < > 1.02*  --  1.09*  --  1.06*  --   --  1.00  --  0.91  --   CALCIUM 8.1*   < > 8.4*   < > 8.5*   < > 8.4*   < > 8.5*  --   --  8.5*  --  8.5*  --   MG 2.8*  --  2.7*  --  2.7*  --   --   --  2.7*  --   --   --   --  2.7*  --   PHOS 2.2*  --  4.4  --  2.5  --   --   --  2.7  --   --   --   --  2.5  --    < > = values in this interval not displayed.    Liver Function Tests: No results for input(s): AST, ALT, ALKPHOS, BILITOT, PROT, ALBUMIN in the last 168 hours. No results for input(s): LIPASE, AMYLASE in the last 168 hours. No results for input(s): AMMONIA in the last 168 hours.  CBC: Recent Labs  Lab 02/07/20 0311 02/07/20 0326 02/10/20 0322 02/10/20 0353 02/10/20 1701 02/10/20 1710 02/11/20 0322 02/11/20 0333 02/11/20 1513 02/11/20 1608 02/11/20 2016 02/12/20 0405  02/12/20 0516  WBC 13.6*   < > 14.9*  --  21.2*  --  17.4*  --   --  15.5*  --  13.3*  --   NEUTROABS 10.3*  --   --   --   --   --   --   --   --   --   --   --   --   HGB 8.7*   < > 8.0*   < > 8.2*   < > 8.0*   < > 9.2* 8.4* 8.8* 8.1* 9.2*  HCT 27.7*   < > 26.6*   < > 27.5*   < > 26.9*   < > 27.0* 28.0* 26.0* 27.3* 27.0*  MCV 102.2*   < > 108.1*  --  109.6*  --  108.9*  --   --  110.2*  --  109.6*  --   PLT 69*   < > 105*  --  121*  --  114*  --   --  106*  --  104*  --    < > = values in this interval not displayed.    Cardiac Enzymes: No results for input(s): CKTOTAL, CKMB, CKMBINDEX, TROPONINI in the last 168 hours.  BNP: BNP (last 3 results) No results for input(s): BNP in the last 8760 hours.  ProBNP (last 3 results) No results for input(s): PROBNP in the last 8760 hours.    Other results:  Imaging: DG CHEST PORT 1 VIEW  Result Date: 02/11/2020 CLINICAL DATA:  ECMO EXAM: PORTABLE CHEST 1 VIEW COMPARISON:  02/10/2020 FINDINGS: Tracheostomy in satisfactory position.  ECMO catheter. Enteric tube courses into the stomach. Right arm PICC is obscured but likely terminates at the cavoatrial junction. Left subclavian catheter terminates at the level of  the clavicle. Multifocal patchy opacities with slightly improved aeration of the lungs bilaterally. No pneumothorax. The cardiomediastinal silhouette is largely obscured. Cervical spine fixation hardware, incompletely visualized. IMPRESSION: Stable support apparatus in this patient on ECMO. Multifocal patchy opacities with slightly improved aeration of the lungs bilaterally. Electronically Signed   By: Julian Hy M.D.   On: 02/11/2020 07:44   DG Abd Portable 1V  Result Date: 02/12/2020 CLINICAL DATA:  Feeding tube dysfunction EXAM: PORTABLE ABDOMEN - 1 VIEW COMPARISON:  02/11/2020 FINDINGS: Nonobstructive bowel gas pattern. Enteric tube terminates in the mid gastric body. Additional feeding tube terminates in the distal gastric body  and is looped in the stomach. Cholecystectomy clips. ECMO catheter. IMPRESSION: Nonobstructive bowel gas pattern. Enteric tube terminates in the mid gastric body. Additional feeding tube terminates in the distal gastric body and is looped in the stomach. Electronically Signed   By: Julian Hy M.D.   On: 02/12/2020 07:24   DG Abd Portable 1V  Result Date: 02/11/2020 CLINICAL DATA:  Emesis. COVID-19 infection. Patient is currently on ECMO. EXAM: PORTABLE ABDOMEN - 1 VIEW COMPARISON:  02/09/2020; 01/30/2020; chest radiograph-02/11/2020; 02/10/2020 FINDINGS: Redemonstrated mild gaseous distension of multiple loops of large and small bowel. There are two mildly dilated loops of small bowel within the midline of the upper abdomen which demonstrate potential minimal amount of bowel wall thickening. No pneumoperitoneum, pneumatosis or portal venous gas. Limited visualization of lower thorax demonstrates extensive bilateral consolidative opacities. Enteric tube tips project over the gastric antrum. ECMO cannulation device tip is unchanged in positioning overlying the T12 vertebral body. Post cholecystectomy. Radiopaque structure overlying the lower pelvis is likely external to the patient. No acute osseous abnormalities. Degenerative change of the bilateral hips. IMPRESSION: Similar findings most suggestive of ileus though there are 2 loops of small bowel which demonstrate potential bowel wall thickening as could be seen in the setting of an enteritis either of infectious, inflammatory or ischemic etiology. No pneumoperitoneum pneumatosis. Electronically Signed   By: Sandi Mariscal M.D.   On: 02/11/2020 09:57     Medications:     Scheduled Medications: . sodium chloride   Intravenous Once  . sodium chloride   Intravenous Once  . amiodarone  200 mg Per Tube Daily  . vitamin C  500 mg Per Tube Daily  . B-complex with vitamin C  1 tablet Oral Daily  . chlorhexidine gluconate (MEDLINE KIT)  15 mL Mouth Rinse  BID  . Chlorhexidine Gluconate Cloth  6 each Topical Daily  . clonazePAM  1 mg Per Tube Q6H  . docusate  100 mg Per Tube BID  . feeding supplement (PROSource TF)  45 mL Per Tube TID  . fentaNYL (SUBLIMAZE) injection  50 mcg Intravenous Once  . linagliptin  5 mg Per Tube Daily  . mouth rinse  15 mL Mouth Rinse 10 times per day  . melatonin  9 mg Per Tube QHS  . metoCLOPramide (REGLAN) injection  10 mg Intravenous Q6H  . oxyCODONE  10 mg Per Tube Q6H  . pantoprazole (PROTONIX) IV  40 mg Intravenous BID  . polyethylene glycol  17 g Per Tube Daily  . QUEtiapine  50 mg Per Tube QHS  . sennosides  5 mL Per Tube BID  . zinc sulfate  220 mg Per Tube Daily    Infusions: .  prismasol BGK 4/2.5 300 mL/hr at 02/11/20 2303  . sodium chloride 500 mL (02/11/20 2215)  . sodium chloride 10 mL/hr at 02/12/20 0700  .  albumin human 12.5 g (02/10/20 1521)  . albumin human Stopped (02/06/20 2000)  . feeding supplement (PIVOT 1.5 CAL) 75 mL/hr at 02/11/20 0200  . heparin 450 Units/hr (02/12/20 0700)  . insulin 0.3 mL/hr at 02/12/20 0700  . piperacillin-tazobactam (ZOSYN)  IV Stopped (02/12/20 0535)  . prismasol BGK 2/2.5 replacement solution 500 mL/hr at 02/12/20 0355  . prismasol BGK 4/2.5 2,000 mL/hr at 02/12/20 0602    PRN Medications: sodium chloride, sodium chloride, acetaminophen (TYLENOL) oral liquid 160 mg/5 mL, albumin human, albuterol, dextrose, fentaNYL (SUBLIMAZE) injection, guaiFENesin-dextromethorphan, heparin, hydrALAZINE, HYDROmorphone (DILAUDID) injection, hydrOXYzine, ondansetron **OR** ondansetron (ZOFRAN) IV, sodium chloride   Assessment/Plan:   1. Acute hypoxic/hypercapneic respiratory failure in setting of severe COVID PNA/ARDS -> VV ECMO - admit 8/2 - intubation 8/3 - has received actmera (compelted 8/2), remdesivir (completed 8/6) and steroids - Cannulated for VV ECMO on 8/9 - Extubated 8/16. Reintubated 8/24 - s/p trach 8/30. - Circuit changed 8/24 due to concern for  hemolysis and worsening oxygenation. - CVVHD started 8/22 for volume removal and uremia. Remains anuric.Volume status improved. Keep mildly negative. D/w Renal. Possibly switch to iHD this week - Remains awake on vent  Has grown increasingly weak. Suspect severe critical illness myopathy. PT seeing. She will have a long road. Mild improvement in RUE strength today - Remains on low-dose heparin. Numbers look good. Discussed dosing with PharmD personally. - CXR improved. TVs improving on vent. ABG ok. Tolerating sweep trial this am. Continue to wean - Continue melatonin and seroquel for sleep at night   2. Enterococcus sepsis - remain on high-dose zosyn (through 9/7) per ID - Eraxis added on 8/26 with yeast in BAL 8/24 (ended 9/2) Discussed dosing with PharmD personally. - WBC 17k -> 14k -> 21k -> 17k -> 13k  3. Intracranial hemorrhage - ? Septic emboli - repeat head CT on 8/14 with stable bleeds but increased edema - neurology has seen. Suspect significant long-term injury sustained. Will follow commands. Appears to have dense LUE weakness and possibly LLE - repeat head CT stable 8/16 - tolerating low-dose heparin - Consider TEE at some point to further evalaute. Can consider now that she is re-intubated but won't change management currently  4. Thrombocytopenia - PLTs up > 100k - Continue to follow  5. Morbid obesity - Body mass index is Body mass index is 46.92 kg/m.  6. Poorly controlled DM2 - HgBA1c 10.7 - CBGs remain elevated - On high-dose IV insulin and sugars still high  7. PAF - intermittent episodes. Last 8/26 - Now on po. Quiescent  8. AKI/azotemia - CVVHD started on 8/22 - Down 40+ pounds.  - Keep slightly negative - Remains anuric. No change. - D/w Renal. Possible transition to iHD  10. Ischemic R fingers - ? Due to septic embolic versus vascular issue - Improving  11. Acute liver failure - has underlying fatty liver - mix of conjugated/unconjugated -  LFTs trending back down  12. Emesis - Cor-trak in stomach. Will d/c - Hold TFs - Continue prokinetics - Likely needs PEG  CRITICAL CARE Performed by: Glori Bickers  Total critical care time: 35 minutes  Critical care time was exclusive of separately billable procedures and treating other patients.  Critical care was necessary to treat or prevent imminent or life-threatening deterioration.  Critical care was time spent personally by me (independent of midlevel providers or residents) on the following activities: development of treatment plan with patient and/or surrogate as well as nursing, discussions with consultants, evaluation  of patient's response to treatment, examination of patient, obtaining history from patient or surrogate, ordering and performing treatments and interventions, ordering and review of laboratory studies, ordering and review of radiographic studies, pulse oximetry and re-evaluation of patient's condition.   Length of Stay: 49   Glori Bickers  MD 02/12/2020, 8:14 AM  Advanced Heart Failure Team Pager [phone] (M-F; 7a - 4p)  Please contact [facility] for night-coverage after hours (4p -7a ) and weekends on [email or web address]

## 2020-02-12 NOTE — Progress Notes (Addendum)
[facility] Progress Note    Assessment/ Plan:   1.  Anuric AKI due to shock, sepsis causing acute tubular necrosis: Started CRRT on 8/22 for uremia and volume overload. Off pressor, will try IHD tomorrow, lower BFR since she has intermittent chugging.  Continue CRRT until 17:00 today if hasn't clotted yet.  2.Shock due to sepsis, Covid infection, blood cultures positive for Enterococcus bacteremia: Patient is off pressors.  3.Acute hypoxic and hypercapnic respiratory failure, ARDS due to COVID-19 pneumonia: Currently on ECMO for refractory hypoxemia  On broad-spectrum antibiotics.  IV Zosyn and IV Eraxis appreciate assistance from infectious disease.  Status post tracheostomy 02/05/2020.  Respiratory cultures have been positive for MSSA and Candida Dublinensis.  3.Hypernatremia, hypervolemia: relative free water deficit. Improved with CRRT.  4.Acute toxic metabolic encephalopathy:Multifactorial with uremia contributing. Treatment with CRRT as above.  5.Anemia, thrombocytopenia: With hemolysis likely and acute illness contributing. Transfusions per primary team.Seen by hematologist.  6.Intracranial hemorrhage: With some left-sided weakness  7. GOC: Multiorgan failure.   family meeting on 8/25, continue full scope of treatment. Long-term prognosis poor.  8.  Diabetes per primary  9.  Hypophosphatemia improved  Subjective:    Lying in bed, awake.  Sat in chair yesterday.  Very weak.  Off pressor.     Objective:   BP (!) 136/47   Pulse 96   Temp 98.4 F (36.9 C) (Oral)   Resp (!) 24   Ht 5' 4"  (1.626 m)   Wt 124 kg   SpO2 95%   BMI 46.92 kg/m   Intake/Output Summary (Last 24 hours) at 02/12/2020 0902 Last data filed at 02/12/2020 0800 Gross per 24 hour  Intake 1077.72 ml  Output 2713 ml  Net -1635.28 ml   Weight change: -0.6 kg  Physical Exam: Gen: critically ill, lying in bed CVS: RRR Resp: mech bilaterally Abd: obese Ext: trace edema  bilaterally  Imaging: DG CHEST PORT 1 VIEW  Result Date: 02/11/2020 CLINICAL DATA:  ECMO EXAM: PORTABLE CHEST 1 VIEW COMPARISON:  02/10/2020 FINDINGS: Tracheostomy in satisfactory position.  ECMO catheter. Enteric tube courses into the stomach. Right arm PICC is obscured but likely terminates at the cavoatrial junction. Left subclavian catheter terminates at the level of the clavicle. Multifocal patchy opacities with slightly improved aeration of the lungs bilaterally. No pneumothorax. The cardiomediastinal silhouette is largely obscured. Cervical spine fixation hardware, incompletely visualized. IMPRESSION: Stable support apparatus in this patient on ECMO. Multifocal patchy opacities with slightly improved aeration of the lungs bilaterally. Electronically Signed   By: Julian Hy M.D.   On: 02/11/2020 07:44   DG Abd Portable 1V  Result Date: 02/12/2020 CLINICAL DATA:  Feeding tube dysfunction EXAM: PORTABLE ABDOMEN - 1 VIEW COMPARISON:  02/11/2020 FINDINGS: Nonobstructive bowel gas pattern. Enteric tube terminates in the mid gastric body. Additional feeding tube terminates in the distal gastric body and is looped in the stomach. Cholecystectomy clips. ECMO catheter. IMPRESSION: Nonobstructive bowel gas pattern. Enteric tube terminates in the mid gastric body. Additional feeding tube terminates in the distal gastric body and is looped in the stomach. Electronically Signed   By: Julian Hy M.D.   On: 02/12/2020 07:24   DG Abd Portable 1V  Result Date: 02/11/2020 CLINICAL DATA:  Emesis. COVID-19 infection. Patient is currently on ECMO. EXAM: PORTABLE ABDOMEN - 1 VIEW COMPARISON:  02/09/2020; 01/30/2020; chest radiograph-02/11/2020; 02/10/2020 FINDINGS: Redemonstrated mild gaseous distension of multiple loops of large and small bowel. There are two mildly dilated loops of small bowel within the  midline of the upper abdomen which demonstrate potential minimal amount of bowel wall thickening. No  pneumoperitoneum, pneumatosis or portal venous gas. Limited visualization of lower thorax demonstrates extensive bilateral consolidative opacities. Enteric tube tips project over the gastric antrum. ECMO cannulation device tip is unchanged in positioning overlying the T12 vertebral body. Post cholecystectomy. Radiopaque structure overlying the lower pelvis is likely external to the patient. No acute osseous abnormalities. Degenerative change of the bilateral hips. IMPRESSION: Similar findings most suggestive of ileus though there are 2 loops of small bowel which demonstrate potential bowel wall thickening as could be seen in the setting of an enteritis either of infectious, inflammatory or ischemic etiology. No pneumoperitoneum pneumatosis. Electronically Signed   By: Sandi Mariscal M.D.   On: 02/11/2020 09:57    Labs: [employer] Recent Labs  Lab 02/07/20 0305 02/07/20 0326 02/08/20 0414 02/08/20 0513 02/09/20 0341 02/09/20 0352 02/09/20 1604 02/09/20 1611 02/10/20 0322 02/10/20 0353 02/10/20 1701 02/10/20 1710 02/11/20 0322 02/11/20 0333 02/11/20 0949 02/11/20 1123 02/11/20 1513 02/11/20 1608 02/11/20 2016 02/12/20 0405 02/12/20 0516  NA 137   < > 139   < > 137   < > 136   < > 135   < > 138   < > 138   < > 139 138 139 137 139 137 139  K 3.7   < > 4.0   < > 4.1   < > 4.3   < > 4.2   < > 4.2   < > 4.0   < > 4.5 4.3 4.1 4.1 4.5 4.0 4.0  CL 101   < > 104   < > 102  --  102  --  102  --  104  --  101  --   --   --   --  102  --  102  --   CO2 22   < > 24   < > 24  --  22  --  22  --  24  --  23  --   --   --   --  23  --  22  --   GLUCOSE 180*   < > 135*   < > 215*  --  162*  --  138*  --  186*  --  172*  --   --   --   --  127*  --  126*  --   BUN 32*   < > 34*   < > 33*  --  30*  --  25*  --  25*  --  28*  --   --   --   --  20  --  14  --   CREATININE 0.79   < > 0.80   < > 0.94  --  1.02*  --  1.02*  --  1.09*  --  1.06*  --   --   --   --  1.00  --  0.91  --   CALCIUM 8.0*   < > 8.0*   < >  8.1*  --  8.4*  --  8.5*  --  8.4*  --  8.5*  --   --   --   --  8.5*  --  8.5*  --   PHOS 3.0  --  3.1  --  2.2*  --  4.4  --  2.5  --   --   --  2.7  --   --   --   --   --   --  2.5  --    < > = values in this interval not displayed.   CBC Recent Labs  Lab 02/07/20 0311 02/07/20 0326 02/10/20 1701 02/10/20 1710 02/11/20 0322 02/11/20 0333 02/11/20 1608 02/11/20 2016 02/12/20 0405 02/12/20 0516  WBC 13.6*   < > 21.2*  --  17.4*  --  15.5*  --  13.3*  --   NEUTROABS 10.3*  --   --   --   --   --   --   --   --   --   HGB 8.7*   < > 8.2*   < > 8.0*   < > 8.4* 8.8* 8.1* 9.2*  HCT 27.7*   < > 27.5*   < > 26.9*   < > 28.0* 26.0* 27.3* 27.0*  MCV 102.2*   < > 109.6*  --  108.9*  --  110.2*  --  109.6*  --   PLT 69*   < > 121*  --  114*  --  106*  --  104*  --    < > = values in this interval not displayed.    Medications:    . sodium chloride   Intravenous Once  . sodium chloride   Intravenous Once  . amiodarone  200 mg Per Tube Daily  . vitamin C  500 mg Per Tube Daily  . B-complex with vitamin C  1 tablet Oral Daily  . chlorhexidine gluconate (MEDLINE KIT)  15 mL Mouth Rinse BID  . Chlorhexidine Gluconate Cloth  6 each Topical Daily  . clonazePAM  1 mg Per Tube Q6H  . docusate  100 mg Per Tube BID  . feeding supplement (PROSource TF)  45 mL Per Tube TID  . fentaNYL (SUBLIMAZE) injection  50 mcg Intravenous Once  . linagliptin  5 mg Per Tube Daily  . mouth rinse  15 mL Mouth Rinse 10 times per day  . melatonin  9 mg Per Tube QHS  . metoCLOPramide (REGLAN) injection  10 mg Intravenous Q6H  . oxyCODONE  10 mg Per Tube Q6H  . pantoprazole (PROTONIX) IV  40 mg Intravenous BID  . polyethylene glycol  17 g Per Tube Daily  . QUEtiapine  50 mg Per Tube QHS  . sennosides  5 mL Per Tube BID  . zinc sulfate  220 mg Per Tube Daily      Madelon Lips MD 02/12/2020, 9:02 AM

## 2020-02-12 NOTE — Progress Notes (Signed)
Physical Therapy Treatment Patient Details Name: Alisha Stephenson MRN: [id number] DOB: 03-27-1966 Today's Date: 02/12/2020    History of Present Illness 54 yo admitted 8/2 with hypoxemia due to Covid 19 PNA. 8/3 intubated. 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 CRRT initiated 8/22. Reintubated 8/24 with ECMO circuit changed, 8/30 trach. PMHx: DM, diverticulitis, Asthma    PT Comments    Pt awake, communicating and mouthing words throughout. Pt with improved RUE strength able to move in 2-/5 strength for elbow flexion and shoulder flexion with gravity eliminated as well as 2-/5 right plantarflexion without activation into dorsiflexion. Pt did not move left side actively at all today. Attempted to tilt however bed not functioning properly with Kreg rep called for replacement bed. Pt then positioned in chair position to increase knee flexion and tolerance for upright. Pt would benefit from PRaFO boots to prevent plantarflexion. Will continue to follow.  131/47 HR 92-95 SpO2 93-95% on 40% FiO2 pressure control   Follow Up Recommendations  LTACH;Supervision/Assistance - 24 hour     Equipment Recommendations  Other (comment) (TBD)    Recommendations for Other Services       Precautions / Restrictions Precautions Precautions: Other (comment) Precaution Comments: ECMO with RIJ access anchors at right shoulder and chest, right necrotic thumb and index finger, bruised/necrotic left hand, CRRT left subclavian, trach, vent, flexiseal    Mobility  Bed Mobility Overal bed mobility: Needs Assistance             General bed mobility comments: total +2 for sliding in bed  Transfers                 General transfer comment: strapped pt to initiate tilt however upper right rail would not engage for tilt. Transitioned to reverse trendelenburg 15 degrees for 3 min then transitioned to chair positioning for upright  Ambulation/Gait                  Stairs             Wheelchair Mobility    Modified Rankin (Stroke Patients Only)       Balance                                            Cognition Arousal/Alertness: Awake/alert Behavior During Therapy: WFL for tasks assessed/performed Overall Cognitive Status: Difficult to assess Area of Impairment: Following commands                       Following Commands: Follows one step commands with increased time       General Comments: pt alert, smiling, nodding, head, mouthing words. Following single step commands within strength for RUE      Exercises General Exercises - Upper Extremity Shoulder Flexion: AAROM;10 reps;Supine;Right Elbow Flexion: AAROM;Right;10 reps;Supine Elbow Extension: AAROM;10 reps;Right;Supine General Exercises - Lower Extremity Ankle Circles/Pumps: PROM;Both;Supine;10 reps Heel Slides: PROM;Both;Supine;10 reps Hip ABduction/ADduction: PROM;Both;Supine;10 reps    General Comments        Pertinent Vitals/Pain Faces Pain Scale: Hurts little more Pain Location: bil knees with flexion and bil heels with ROM Pain Descriptors / Indicators: Grimacing Pain Intervention(s): Limited activity within patient's tolerance;Monitored during session;Repositioned    Home Living  Prior Function            PT Goals (current goals can now be found in the care plan section) Progress towards PT goals: Progressing toward goals    Frequency    Min 3X/week      PT Plan Current plan remains appropriate;Frequency needs to be updated    Co-evaluation              AM-PAC PT "6 Clicks" Mobility   Outcome Measure  Help needed turning from your back to your side while in a flat bed without using bedrails?: Total Help needed moving from lying on your back to sitting on the side of a flat bed without using bedrails?: Total Help needed moving to and from a bed to a chair  (including a wheelchair)?: Total Help needed standing up from a chair using your arms (e.g., wheelchair or bedside chair)?: Total Help needed to walk in hospital room?: Total Help needed climbing 3-5 steps with a railing? : Total 6 Click Score: 6    End of Session   Activity Tolerance: Patient tolerated treatment well Patient left: in bed;with nursing/sitter in room;with family/visitor present Nurse Communication: Mobility status;Need for lift equipment PT Visit Diagnosis: Other abnormalities of gait and mobility (R26.89);Muscle weakness (generalized) (M62.81)     Time: 0981-1914 PT Time Calculation (min) (ACUTE ONLY): 44 min  Charges:  $Therapeutic Exercise: 23-37 mins $Therapeutic Activity: 8-22 mins                     Fatime Biswell P, PT Acute Rehabilitation Services Pager: [phone] Office: [place] 02/12/2020, 1:39 PM

## 2020-02-12 NOTE — Progress Notes (Signed)
NAME:  Alisha Stephenson, MRN:  [id number], DOB:  08-04-1965, LOS: 31 ADMISSION DATE:  01/28/2020, CONSULTATION DATE:  8/3 REFERRING MD:  Alfredia Ferguson, CHIEF COMPLAINT:  Dyspnea   Brief History   54 y/o female admitted on 8/2 with severe acute respiratory failure with hypoxemia due to COVID 19 pneumonia.  She developed symptoms 1 week prior to admission.  Past Medical History  DM2 Diverticulitis Gallstones Ovarian cyst NAFLD Asthma  Significant Hospital Events   8/2 admit 8/3 ICU transfer, intubated 8/4 prone, paralyze 8/9 significant desaturations today 8/9 VV ECMO cannulation 8/13 ICH 8/30 trach  Consults:  PCCM ECMO team  Procedures:  8/3 ETT > 8/30 8/30 8-0 shiley trach 8/3 PICC >  8/9 LIJ MML 8/9 RIJ Crescent 48F   Significant Diagnostic Tests:  7/31 CT head > NAICP 7/31 MRI/MRA brain > no acute changes, possibly small aneurysm ACOM 8/13 CT head> multiple areas of ICH 9/5 CXR > significant improvement in aeration bilaterally.   Micro Data:  8/2 blood > NG 8/2 SARS COV 2 > positive 8/4 resp > negative 8/4 urine >  8/12 blood > E. faecalis (pan-sensitive) 8/12 resp: staph aureus> MSSA 8/25 blood>> NG 8/24 BAL> yeast  Antimicrobials:  See fever tab for past abx Current Eraxis 8/26>> Zosyn 8/23>>  Interim history/subjective:  Cortrak clogged OGT drained about 200cc  Objective   Blood pressure (!) 136/47, pulse 92, temperature 97.7 F (36.5 C), temperature source Axillary, resp. rate (!) 23, height _0  (1.626 m), weight 124 kg, SpO2 90 %.    Vent Mode: PCV FiO2 (%):  [40 %] 40 % Set Rate:  [15 bmp] 15 bmp PEEP:  [10 cmH20] 10 cmH20 Plateau Pressure:  [20 cmH20-29 cmH20] 24 cmH20   Intake/Output Summary (Last 24 hours) at 02/12/2020 0645 Last data filed at 02/12/2020 0600 Gross per 24 hour  Intake 1098.72 ml  Output 3049 ml  Net -1950.28 ml   Filed Weights   02/10/20 0500 02/11/20 0500 02/12/20 0500  Weight: 124.9 kg 124.6 kg 124 kg     Examination:  GEN: no acute distress watching TV HEENT: trach in place, bleeding improved CV: RRR, ext warm PULM: shallow inspiratory efforts, nonlabored GI: soft, hypoactive BS EXT: trace anasarca NEURO: moves all 4 ext to command PSYCH: RASS 0 SKIN: Pale, finger ischemic stable  ABG paO2 57 BMP okay CBC stable No new micro data Zosyn ongoing, end date? Fibrinogen 547>>571 LDH 489 stable PTT 40 Net -1.8  Resolved Hospital Problem list   MSSA pneumonia Fungal pneumonia- Candida dublinensis Enterococcal bacteremia-recent blood cultures cleared Concern for possible embolic phenomenon- fingers and possibly CVAs were embolic  Assessment & Plan:    Critically ill due to Acute hypoxic and AoC hypercapneic respiratory failure; likely underly OHS requiring VV ECMO support and mechanical ventilation.  ARDS due to COVID 19 pneumonia   Tracheostomy in place; previously had bleeding -Continue ultra lung protective ventilation.  -Continue VV ECMO support - wean sweep again today, check CXR -VAP prevention protocol -Not currently requiring sedation for mechanical ventilation. -Routine trach care -Antimicrobials per ID  Elevated LDH, hypo-fibrinogenemia and thrombocytopenia--improving since circuit change. -Continue to monitor  ICH, multifocal. L-sided weakness.  Critical illness myopathy -Continue PT, OT  Acute metabolic encephalopathy, improved - IV Sedation requirements minimal; only PRN now -Seroquel and melatonin nightly -Delirium precautions   Acute renal failure, severe uremia.  Anuric. -Continue CRRT with net fluid removal  -Renally dose medications -Strict I/os  Shock related to  RV strain -  now resolved, patient is now hypertension  - Midodrine to DC today - If still hypertensive off midodrine, would initiate betablocker as agent of choice to decrease CO and improve oxygenation.   Hyperglycemia > not controlled at all with [place] insulin despite aggressive  upward titration.  Assume poor absorption, potentially due to subcutaneous edema.   -Improved on IV, may want to consider another trial of subcutaneous at some point  Best practice:  Diet: tube feeding Pain/Anxiety/Delirium protocol (if indicated): as above VAP protocol (if indicated): yes DVT prophylaxis: SCDs , heparin GI prophylaxis: pantoprazole Glucose control:  insulin infusion  Mobility: bed rest Code Status: limited Family Communication: will update when they come in Disposition: ICU  This patient is critically ill with multiple organ system failure which requires frequent high complexity decision making, assessment, support, evaluation, and titration of therapies. This was completed through the application of advanced monitoring technologies and extensive interpretation of multiple databases. During this encounter critical care time was devoted to patient care services described in this note for 34 minutes.  Candee Furbish, MD 02/12/20 6:45 AM [facility]

## 2020-02-12 NOTE — Plan of Care (Signed)
Pt is alert, oriented, able to voice out words and nods appropriately. Pt has remained on 40% Fio2 throughout the night with oxygen saturation 90-98%. Pt has had a small amount of tracheal secretions, no oozing noted from the site. Pt is able to move all extremities but is very weak. Pt did have one episode of restlessness during the night due to general discomfort, she was repositioned and emotional support offered. Unclogging protocol was attempted twice with no success, unable give medications thru cortrak at this time. Pt has had some emesis via NGT and small amount of diarrhea, see I/O flowsheet. No issues with CRRT at this time. Bed is in lowest position. Problem: Clinical Measurements: Goal: Cardiovascular complication will be avoided Outcome: Progressing   Problem: Activity: Goal: Risk for activity intolerance will decrease Outcome: Progressing   Problem: Nutrition: Goal: Adequate nutrition will be maintained Outcome: Not Progressing   Problem: Coping: Goal: Level of anxiety will decrease Outcome: Progressing   Problem: Elimination: Goal: Will not experience complications related to bowel motility Outcome: Progressing

## 2020-02-12 NOTE — Procedures (Signed)
Extracorporeal support note   ECLS support day: 24 (cannulated 02/06/2020) Indication: Covid 19 pneumonia/ARDS  Configuration: VV  Drainage cannula: right IJ Crescent  Return cannula: same  Pump speed: 4000 Pump flow: 4.6 Pump used: Centrimag  Oxygenator: Quadrox O2 blender: 100% Sweep gas: 9  ABG    Component Value Date/Time   PHART 7.415 02/12/2020 0516   PCO2ART 37.8 02/12/2020 0516   PO2ART 57 (L) 02/12/2020 0516   HCO3 24.3 02/12/2020 0516   TCO2 25 02/12/2020 0516   ACIDBASEDEF 1.0 02/09/2020 1611   O2SAT 90.0 02/12/2020 0516    Circuit check: no visible clot Anticoagulant: Continue heparin Anticoagulation targets: heparin fixed dose 500 units per hour  Changes in support: Wean sweep.   Anticipated goals/duration of support: bridge to recovery. Showing signs of lung recovery.  Candee Furbish, MD 02/12/20 6:54 AM [facility]

## 2020-02-12 NOTE — Progress Notes (Signed)
ANTICOAGULATION CONSULT NOTE - Follow-Up Consult  Pharmacy Consult for Heparin Indication: ECMO  Allergies  Allergen Reactions   Sulfa Antibiotics Rash and Other (See Comments)    Patient Measurements: Height: 5\' 4"  (162.6 cm) Weight: 124 kg (273 lb 5.9 oz) IBW/kg (Calculated) : 54.7 Heparin Dosing Weight: 87 kg  Vital Signs: Temp: 98.4 F (36.9 C) (09/06 0805) Temp Source: Oral (09/06 0805) BP: 136/47 (09/06 0336) Pulse Rate: 96 (09/06 0900)  Labs: Recent Labs    02/10/20 1208 02/10/20 1701 02/11/20 0322 02/11/20 0323 02/11/20 0333 02/11/20 1608 02/11/20 1608 02/11/20 1812 02/11/20 2016 02/11/20 2016 02/12/20 0405 02/12/20 0516 02/12/20 0521  HGB  --    < > 8.0*  --    < > 8.4*   < >  --  8.8*   < > 8.1* 9.2*  --   HCT  --    < > 26.9*  --    < > 28.0*   < >  --  26.0*  --  27.3* 27.0*  --   PLT  --    < > 114*  --   --  106*  --   --   --   --  104*  --   --   APTT  --    < > 38*  --   --   --   --  39*  --   --   --   --  40*  HEPARINUNFRC <0.10*  --   --  <0.10*  --   --   --   --   --   --   --   --  <0.10*  CREATININE  --    < > 1.06*  --   --  1.00  --   --   --   --  0.91  --   --    < > = values in this interval not displayed.    Estimated Creatinine Clearance: 90.9 mL/min (by C-G formula based on SCr of 0.91 mg/dL).   Medical History: Past Medical History:  Diagnosis Date   Asthma    Diabetes mellitus without complication (HCC)    Diverticulitis    Gallstones    IBS (irritable bowel syndrome)    NAFLD (nonalcoholic fatty liver disease)    CT scan 2015   Ovarian cyst     Assessment: 54 yo female who is COVID+ on VV ECMO. Patient was started on bivalirudin but was found to have multiple ICH on 8/13 head CT. Bivalirudin was stopped at this time and pt has been off anticoagulation. Her ECMO circuit was changed 8/24, and pharmacy asked to start low-fixed rate heparin.  Heparin infusion was held last week due to bleeding from trach site.  Hgb 9.2, plts 104 this morning. Fibrinogen 571, LDH 489. ECMO circuit is stable - mild clots in circuit corners.   Heparin at 500 units/hr with plans to not titrate for now. Heparin level remains undetectable, aPTT 40  Goal of Therapy:  Heparin level 0.2-0.5 units/ml - currently not titrating to goal per discussion with ECMO team Monitor platelets by anticoagulation protocol: Yes   Plan:  Continue Heparin infusion at 500 units/hr  Monitor daily CBC, LDH, fibrinogen, s/sx of bleeding  [place] Crane, Vena Austria, BCPS, BCCP Clinical Pharmacist  02/12/2020 9:22 AM   [facility] pharmacy phone numbers are listed on [email or web address]  Please check [email or web address] for unit-specific pharmacy phone numbers.

## 2020-02-12 NOTE — Progress Notes (Signed)
Orthopedic Tech Progress Note Patient Details:  Alisha Stephenson 01-31-1966 [id number] Applied BLE PRAFOS to patient with RN and PT HUSBAND at bedside. He had some concerns about the [facility] going onto the LLE because of a procedure she had done 2 years ago.[employer] Type of Ortho Device: Prafo boot/shoe Ortho Device/Splint Location: BLE Ortho Device/Splint Interventions: Ordered, Application   Post Interventions Patient Tolerated: Well Instructions Provided: [facility] [place] 02/12/2020, 5:59 PM

## 2020-02-13 ENCOUNTER — Inpatient Hospital Stay (HOSPITAL_COMMUNITY): Payer: PRIVATE HEALTH INSURANCE

## 2020-02-13 DIAGNOSIS — I359 Nonrheumatic aortic valve disorder, unspecified: Secondary | ICD-10-CM

## 2020-02-13 LAB — POCT I-STAT 7, (LYTES, BLD GAS, ICA,H+H)
Acid-Base Excess: 0 mmol/L (ref 0.0–2.0)
Acid-Base Excess: 1 mmol/L (ref 0.0–2.0)
Acid-Base Excess: 1 mmol/L (ref 0.0–2.0)
Acid-Base Excess: 0 mmol/L (ref 0.0–2.0)
Acid-Base Excess: 0 mmol/L (ref 0.0–2.0)
Acid-Base Excess: 0 mmol/L (ref 0.0–2.0)
Acid-Base Excess: 2 mmol/L (ref 0.0–2.0)
Acid-base deficit: 1 mmol/L (ref 0.0–2.0)
Bicarbonate: 25.8 mmol/L (ref 20.0–28.0)
Bicarbonate: 25.9 mmol/L (ref 20.0–28.0)
Bicarbonate: 24.5 mmol/L (ref 20.0–28.0)
Bicarbonate: 27.2 mmol/L (ref 20.0–28.0)
Bicarbonate: 26 mmol/L (ref 20.0–28.0)
Bicarbonate: 26.1 mmol/L (ref 20.0–28.0)
Bicarbonate: 26.1 mmol/L (ref 20.0–28.0)
Bicarbonate: 26.8 mmol/L (ref 20.0–28.0)
Calcium, Ion: 1.17 mmol/L (ref 1.15–1.40)
Calcium, Ion: 1.13 mmol/L — ABNORMAL LOW (ref 1.15–1.40)
Calcium, Ion: 1.12 mmol/L — ABNORMAL LOW (ref 1.15–1.40)
Calcium, Ion: 1.09 mmol/L — ABNORMAL LOW (ref 1.15–1.40)
Calcium, Ion: 1.14 mmol/L — ABNORMAL LOW (ref 1.15–1.40)
Calcium, Ion: 1.12 mmol/L — ABNORMAL LOW (ref 1.15–1.40)
Calcium, Ion: 1.12 mmol/L — ABNORMAL LOW (ref 1.15–1.40)
Calcium, Ion: 1.1 mmol/L — ABNORMAL LOW (ref 1.15–1.40)
HCT: 25 % — ABNORMAL LOW (ref 36.0–46.0)
HCT: 25 % — ABNORMAL LOW (ref 36.0–46.0)
HCT: 25 % — ABNORMAL LOW (ref 36.0–46.0)
HCT: 27 % — ABNORMAL LOW (ref 36.0–46.0)
HCT: 29 % — ABNORMAL LOW (ref 36.0–46.0)
HCT: 28 % — ABNORMAL LOW (ref 36.0–46.0)
HCT: 27 % — ABNORMAL LOW (ref 36.0–46.0)
HCT: 27 % — ABNORMAL LOW (ref 36.0–46.0)
Hemoglobin: 8.5 g/dL — ABNORMAL LOW (ref 12.0–15.0)
Hemoglobin: 8.5 g/dL — ABNORMAL LOW (ref 12.0–15.0)
Hemoglobin: 8.5 g/dL — ABNORMAL LOW (ref 12.0–15.0)
Hemoglobin: 9.2 g/dL — ABNORMAL LOW (ref 12.0–15.0)
Hemoglobin: 9.9 g/dL — ABNORMAL LOW (ref 12.0–15.0)
Hemoglobin: 9.5 g/dL — ABNORMAL LOW (ref 12.0–15.0)
Hemoglobin: 9.2 g/dL — ABNORMAL LOW (ref 12.0–15.0)
Hemoglobin: 9.2 g/dL — ABNORMAL LOW (ref 12.0–15.0)
O2 Saturation: 90 %
O2 Saturation: 91 %
O2 Saturation: 89 %
O2 Saturation: 88 %
O2 Saturation: 87 %
O2 Saturation: 88 %
O2 Saturation: 90 %
O2 Saturation: 90 %
Patient temperature: 99
Patient temperature: 99
Patient temperature: 98.7
Patient temperature: 98
Patient temperature: 98.1
Patient temperature: 98.2
Patient temperature: 98.8
Patient temperature: 99.2
Potassium: 3.9 mmol/L (ref 3.5–5.1)
Potassium: 3.8 mmol/L (ref 3.5–5.1)
Potassium: 3.8 mmol/L (ref 3.5–5.1)
Potassium: 4.1 mmol/L (ref 3.5–5.1)
Potassium: 3.9 mmol/L (ref 3.5–5.1)
Potassium: 3.9 mmol/L (ref 3.5–5.1)
Potassium: 4.1 mmol/L (ref 3.5–5.1)
Potassium: 4.3 mmol/L (ref 3.5–5.1)
Sodium: 137 mmol/L (ref 135–145)
Sodium: 138 mmol/L (ref 135–145)
Sodium: 140 mmol/L (ref 135–145)
Sodium: 139 mmol/L (ref 135–145)
Sodium: 138 mmol/L (ref 135–145)
Sodium: 139 mmol/L (ref 135–145)
Sodium: 139 mmol/L (ref 135–145)
Sodium: 138 mmol/L (ref 135–145)
TCO2: 27 mmol/L (ref 22–32)
TCO2: 27 mmol/L (ref 22–32)
TCO2: 26 mmol/L (ref 22–32)
TCO2: 29 mmol/L (ref 22–32)
TCO2: 27 mmol/L (ref 22–32)
TCO2: 28 mmol/L (ref 22–32)
TCO2: 28 mmol/L (ref 22–32)
TCO2: 28 mmol/L (ref 22–32)
pCO2 arterial: 50.3 mmHg — ABNORMAL HIGH (ref 32.0–48.0)
pCO2 arterial: 44.6 mmHg (ref 32.0–48.0)
pCO2 arterial: 44.8 mmHg (ref 32.0–48.0)
pCO2 arterial: 48.6 mmHg — ABNORMAL HIGH (ref 32.0–48.0)
pCO2 arterial: 48.5 mmHg — ABNORMAL HIGH (ref 32.0–48.0)
pCO2 arterial: 47.3 mmHg (ref 32.0–48.0)
pCO2 arterial: 47.5 mmHg (ref 32.0–48.0)
pCO2 arterial: 41.2 mmHg (ref 32.0–48.0)
pH, Arterial: 7.319 — ABNORMAL LOW (ref 7.350–7.450)
pH, Arterial: 7.373 (ref 7.350–7.450)
pH, Arterial: 7.347 — ABNORMAL LOW (ref 7.350–7.450)
pH, Arterial: 7.355 (ref 7.350–7.450)
pH, Arterial: 7.337 — ABNORMAL LOW (ref 7.350–7.450)
pH, Arterial: 7.348 — ABNORMAL LOW (ref 7.350–7.450)
pH, Arterial: 7.349 — ABNORMAL LOW (ref 7.350–7.450)
pH, Arterial: 7.423 (ref 7.350–7.450)
pO2, Arterial: 65 mmHg — ABNORMAL LOW (ref 83.0–108.0)
pO2, Arterial: 63 mmHg — ABNORMAL LOW (ref 83.0–108.0)
pO2, Arterial: 60 mmHg — ABNORMAL LOW (ref 83.0–108.0)
pO2, Arterial: 57 mmHg — ABNORMAL LOW (ref 83.0–108.0)
pO2, Arterial: 57 mmHg — ABNORMAL LOW (ref 83.0–108.0)
pO2, Arterial: 58 mmHg — ABNORMAL LOW (ref 83.0–108.0)
pO2, Arterial: 64 mmHg — ABNORMAL LOW (ref 83.0–108.0)
pO2, Arterial: 58 mmHg — ABNORMAL LOW (ref 83.0–108.0)

## 2020-02-13 LAB — BASIC METABOLIC PANEL
Anion gap: 14 (ref 5–15)
Anion gap: 12 (ref 5–15)
BUN: 28 mg/dL — ABNORMAL HIGH (ref 6–20)
BUN: 23 mg/dL — ABNORMAL HIGH (ref 6–20)
CO2: 23 mmol/L (ref 22–32)
CO2: 25 mmol/L (ref 22–32)
Calcium: 8.6 mg/dL — ABNORMAL LOW (ref 8.9–10.3)
Calcium: 8.2 mg/dL — ABNORMAL LOW (ref 8.9–10.3)
Chloride: 100 mmol/L (ref 98–111)
Chloride: 101 mmol/L (ref 98–111)
Creatinine, Ser: 1.32 mg/dL — ABNORMAL HIGH (ref 0.44–1.00)
Creatinine, Ser: 1.3 mg/dL — ABNORMAL HIGH (ref 0.44–1.00)
GFR calc Af Amer: 53 mL/min — ABNORMAL LOW (ref >60)
GFR calc Af Amer: 53 mL/min — ABNORMAL LOW (ref >60)
GFR calc non Af Amer: 45 mL/min — ABNORMAL LOW (ref >60)
GFR calc non Af Amer: 46 mL/min — ABNORMAL LOW (ref >60)
Glucose, Bld: 194 mg/dL — ABNORMAL HIGH (ref 70–99)
Glucose, Bld: 176 mg/dL — ABNORMAL HIGH (ref 70–99)
Potassium: 3.8 mmol/L (ref 3.5–5.1)
Potassium: 4 mmol/L (ref 3.5–5.1)
Sodium: 137 mmol/L (ref 135–145)
Sodium: 138 mmol/L (ref 135–145)

## 2020-02-13 LAB — GLUCOSE, CAPILLARY
Glucose-Capillary: 152 mg/dL — ABNORMAL HIGH (ref 70–99)
Glucose-Capillary: 158 mg/dL — ABNORMAL HIGH (ref 70–99)
Glucose-Capillary: 166 mg/dL — ABNORMAL HIGH (ref 70–99)
Glucose-Capillary: 166 mg/dL — ABNORMAL HIGH (ref 70–99)
Glucose-Capillary: 170 mg/dL — ABNORMAL HIGH (ref 70–99)
Glucose-Capillary: 174 mg/dL — ABNORMAL HIGH (ref 70–99)
Glucose-Capillary: 175 mg/dL — ABNORMAL HIGH (ref 70–99)
Glucose-Capillary: 180 mg/dL — ABNORMAL HIGH (ref 70–99)
Glucose-Capillary: 181 mg/dL — ABNORMAL HIGH (ref 70–99)
Glucose-Capillary: 181 mg/dL — ABNORMAL HIGH (ref 70–99)
Glucose-Capillary: 182 mg/dL — ABNORMAL HIGH (ref 70–99)
Glucose-Capillary: 183 mg/dL — ABNORMAL HIGH (ref 70–99)
Glucose-Capillary: 186 mg/dL — ABNORMAL HIGH (ref 70–99)
Glucose-Capillary: 194 mg/dL — ABNORMAL HIGH (ref 70–99)
Glucose-Capillary: 198 mg/dL — ABNORMAL HIGH (ref 70–99)
Glucose-Capillary: 202 mg/dL — ABNORMAL HIGH (ref 70–99)
Glucose-Capillary: 207 mg/dL — ABNORMAL HIGH (ref 70–99)
Glucose-Capillary: 210 mg/dL — ABNORMAL HIGH (ref 70–99)

## 2020-02-13 LAB — ECHOCARDIOGRAM LIMITED
Area-P 1/2: 4.06 cm2
Height: 64 in
S' Lateral: 3.1 cm
Weight: 4285.74 oz

## 2020-02-13 LAB — PREPARE RBC (CROSSMATCH)

## 2020-02-13 LAB — CBC
HCT: 25.6 % — ABNORMAL LOW (ref 36.0–46.0)
HCT: 28.5 % — ABNORMAL LOW (ref 36.0–46.0)
Hemoglobin: 7.5 g/dL — ABNORMAL LOW (ref 12.0–15.0)
Hemoglobin: 8.7 g/dL — ABNORMAL LOW (ref 12.0–15.0)
MCH: 32.8 pg (ref 26.0–34.0)
MCH: 33.7 pg (ref 26.0–34.0)
MCHC: 29.3 g/dL — ABNORMAL LOW (ref 30.0–36.0)
MCHC: 30.5 g/dL (ref 30.0–36.0)
MCV: 111.8 fL — ABNORMAL HIGH (ref 80.0–100.0)
MCV: 110.5 fL — ABNORMAL HIGH (ref 80.0–100.0)
Platelets: 102 10*3/uL — ABNORMAL LOW (ref 150–400)
Platelets: 94 10*3/uL — ABNORMAL LOW (ref 150–400)
RBC: 2.29 MIL/uL — ABNORMAL LOW (ref 3.87–5.11)
RBC: 2.58 MIL/uL — ABNORMAL LOW (ref 3.87–5.11)
RDW: 29.6 % — ABNORMAL HIGH (ref 11.5–15.5)
RDW: 29.4 % — ABNORMAL HIGH (ref 11.5–15.5)
WBC: 13.8 10*3/uL — ABNORMAL HIGH (ref 4.0–10.5)
WBC: 13.6 10*3/uL — ABNORMAL HIGH (ref 4.0–10.5)
nRBC: 3.8 % — ABNORMAL HIGH (ref 0.0–0.2)
nRBC: 3 % — ABNORMAL HIGH (ref 0.0–0.2)

## 2020-02-13 LAB — FIBRINOGEN: Fibrinogen: 486 mg/dL — ABNORMAL HIGH (ref 210–475)

## 2020-02-13 LAB — LACTIC ACID, PLASMA
Lactic Acid, Venous: 1.2 mmol/L (ref 0.5–1.9)
Lactic Acid, Venous: 1.2 mmol/L (ref 0.5–1.9)

## 2020-02-13 LAB — HEPARIN LEVEL (UNFRACTIONATED): Heparin Unfractionated: <0.1 IU/mL — ABNORMAL LOW (ref 0.30–0.70)

## 2020-02-13 LAB — APTT
aPTT: 38 seconds — ABNORMAL HIGH (ref 24–36)
aPTT: 41 seconds — ABNORMAL HIGH (ref 24–36)

## 2020-02-13 LAB — MAGNESIUM: Magnesium: 2.9 mg/dL — ABNORMAL HIGH (ref 1.7–2.4)

## 2020-02-13 LAB — PHOSPHORUS: Phosphorus: 3.7 mg/dL (ref 2.5–4.6)

## 2020-02-13 LAB — LACTATE DEHYDROGENASE: LDH: 401 U/L — ABNORMAL HIGH (ref 98–192)

## 2020-02-13 LAB — HEPATITIS B SURFACE ANTIGEN: Hepatitis B Surface Ag: NONREACTIVE

## 2020-02-13 LAB — HEPATITIS B CORE ANTIBODY, TOTAL: Hep B Core Total Ab: NONREACTIVE

## 2020-02-13 MED ORDER — SODIUM CHLORIDE 0.9% IV SOLUTION: Freq: Once | INTRAVENOUS | Status: AC

## 2020-02-13 MED ORDER — NOREPINEPHRINE 16 MG/250ML-% IV SOLN
0.0000 ug/min | INTRAVENOUS | Status: DC
  Administered 2020-02-15: 2 ug/min via INTRAVENOUS
  Filled 2020-02-13 (×2): qty 250

## 2020-02-13 MED ORDER — NOREPINEPHRINE 4 MG/250ML-% IV SOLN
0.0000 ug/min | INTRAVENOUS | Status: DC
  Administered 2020-02-13: 10 ug/min via INTRAVENOUS
  Administered 2020-02-13: 5 ug/min via INTRAVENOUS
  Filled 2020-02-13 (×2): qty 250

## 2020-02-13 MED ORDER — MIDODRINE HCL 5 MG PO TABS
10.0000 mg | ORAL_TABLET | Freq: Three times a day (TID) | ORAL | Status: DC
  Administered 2020-02-13 – 2020-03-04 (×61): 10 mg
  Filled 2020-02-13 (×61): qty 2

## 2020-02-13 MED ORDER — HEPARIN SODIUM (PORCINE) 1000 UNIT/ML IJ SOLN
INTRAMUSCULAR | Status: AC
  Administered 2020-02-13: 1000 [IU] via INTRAVENOUS_CENTRAL
  Filled 2020-02-13: qty 4

## 2020-02-13 NOTE — Procedures (Signed)
Extracorporeal support note   ECLS support day: 24 (cannulated 01/12/2020) Indication: Covid 19 pneumonia/ARDS  Configuration: VV  Drainage cannula: right IJ Crescent  Return cannula: same  Pump speed: 4000 Pump flow: 4.8 Pump used: Centrimag  Oxygenator: Quadrox O2 blender: 100% Sweep gas: 6.5  ABG    Component Value Date/Time   PHART 7.355 02/13/2020 0955   PCO2ART 48.6 (H) 02/13/2020 0955   PO2ART 57 (L) 02/13/2020 0955   HCO3 27.2 02/13/2020 0955   TCO2 29 02/13/2020 0955   ACIDBASEDEF 1.0 02/13/2020 0744   O2SAT 88.0 02/13/2020 0955    Circuit check: no visible clot Anticoagulant: Continue heparin Anticoagulation targets: heparin fixed dose 500 units per hour  Changes in support: Wean sweep.   Anticipated goals/duration of support: bridge to recovery. Showing signs of lung recovery.  Start sweep wean.  Candee Furbish, MD 02/13/20 12:50 PM [facility]

## 2020-02-13 NOTE — Progress Notes (Signed)
  Echocardiogram 2D Echocardiogram has been performed.  Alisha Stephenson 02/13/2020, 3:11 PM

## 2020-02-13 NOTE — Progress Notes (Signed)
Per Bensimhon MD goal SBP > 120.

## 2020-02-13 NOTE — Progress Notes (Signed)
ANTICOAGULATION CONSULT NOTE - Follow-Up Consult  Pharmacy Consult for Heparin Indication: ECMO  Allergies  Allergen Reactions   Sulfa Antibiotics Rash and Other (See Comments)    Patient Measurements: Height: 5\' 4"  (162.6 cm) Weight: 123 kg (271 lb 2.7 oz) IBW/kg (Calculated) : 54.7 Heparin Dosing Weight: 87 kg  Vital Signs: Temp: 98.6 F (37 C) (09/07 0700) Temp Source: Axillary (09/07 0700) BP: 116/55 (09/07 0830) Pulse Rate: 93 (09/07 0830)  Labs: Recent Labs    02/11/20 0323 02/11/20 0333 02/12/20 0405 02/12/20 0521 02/12/20 0831 02/12/20 1122 02/12/20 1122 02/12/20 1130 02/12/20 1625 02/12/20 1831 02/13/20 0314 02/13/20 0314 02/13/20 0315 02/13/20 0315 02/13/20 0523 02/13/20 0744  HGB  --    < >  --   --    < > 8.3*   < >  --  8.2*   9.2*   < > 7.5*   < > 8.5*   < > 8.5* 8.5*  HCT  --    < >  --   --    < > 28.2*   < >  --  28.4*   27.0*   < > 25.6*   < > 25.0*  --  25.0* 25.0*  PLT  --    < >  --   --   --  106*  --   --  108*  --  102*  --   --   --   --   --   APTT  --    < >  --  40*  --   --   --   --  41*  --  38*  --   --   --   --   --   HEPARINUNFRC <0.10*  --   --  <0.10*  --   --   --   --   --   --  <0.10*  --   --   --   --   --   CREATININE  --    < >   < >  --   --   --   --  0.96 0.92  --  1.32*  --   --   --   --   --    < > = values in this interval not displayed.    Estimated Creatinine Clearance: 62.3 mL/min (A) (by C-G formula based on SCr of 1.32 mg/dL (H)).   Medical History: Past Medical History:  Diagnosis Date   Asthma    Diabetes mellitus without complication (HCC)    Diverticulitis    Gallstones    IBS (irritable bowel syndrome)    NAFLD (nonalcoholic fatty liver disease)    CT scan 2015   Ovarian cyst     Assessment: 54 yo female who is COVID+ on VV ECMO. Patient was started on bivalirudin but was found to have multiple ICH on 8/13 head CT. Bivalirudin was stopped at this time and pt has been off  anticoagulation. Her ECMO circuit was changed 8/24, and pharmacy asked to start low-fixed rate heparin.  Heparin infusion was held last week due to bleeding from trach site. Hgb down 7.5 - replace with prbc, plts up 20s>100  Fibrinogen stable 400-500, LDH stable 400s. ECMO circuit is stable - mild clots in circuit corners - s/p circuit change   Heparin at 500 units/hr with plans to not titrate for now. Heparin level remains undetectable, aPTT 40 - this is already elevated at baseline  -  has not changes much with heparin  Goal of Therapy:  Heparin level 0.2-0.5 units/ml - currently not titrating to goal per discussion with ECMO team Monitor platelets by anticoagulation protocol: Yes   Plan:  Continue Heparin infusion at 500 units/hr  Monitor daily CBC, LDH, fibrinogen, s/sx of bleeding  Bonnita Nasuti Pharm.D. CPP, BCPS Clinical Pharmacist [phone] 02/13/2020 8:49 AM   Please check [email or web address] for unit-specific pharmacy phone numbers.

## 2020-02-13 NOTE — Progress Notes (Addendum)
NAME:  Alisha Stephenson, MRN:  [id number], DOB:  11-30-1965, LOS: 91 ADMISSION DATE:  01/31/2020, CONSULTATION DATE:  8/3 REFERRING MD:  Alfredia Ferguson, CHIEF COMPLAINT:  Dyspnea   Brief History   54 y/o female admitted on 8/2 with severe acute respiratory failure with hypoxemia due to COVID 19 pneumonia.  She developed symptoms 1 week prior to admission.  Past Medical History  DM2 Diverticulitis Gallstones Ovarian cyst NAFLD Asthma  Significant Hospital Events   8/2 admit 8/3 ICU transfer, intubated 8/4 prone, paralyze 8/9 significant desaturations today 8/9 VV ECMO cannulation 8/13 ICH 8/30 trach  Consults:  PCCM ECMO team  Procedures:  8/3 ETT > 8/30 8/30 8-0 shiley trach 8/3 PICC >  8/9 LIJ MML 8/9 RIJ Crescent 49F   Significant Diagnostic Tests:  7/31 CT head > NAICP 7/31 MRI/MRA brain > no acute changes, possibly small aneurysm ACOM 8/13 CT head> multiple areas of ICH 9/5 CXR > significant improvement in aeration bilaterally.   Micro Data:  8/2 blood > NG 8/2 SARS COV 2 > positive 8/4 resp > negative 8/4 urine >  8/12 blood > E. faecalis (pan-sensitive) 8/12 resp: staph aureus> MSSA 8/25 blood>> NG 8/24 BAL> yeast  Antimicrobials:  See fever tab for past abx Current Eraxis 8/26>> Zosyn 8/23>>  Interim history/subjective:  No events.  Starting to do trials on lower sweeps during day.   Objective   Blood pressure 132/60, pulse 92, temperature 98.4 F (36.9 C), temperature source Oral, resp. rate 16, height _0  (1.626 m), weight 121.5 kg, SpO2 91 %.    Vent Mode: PCV FiO2 (%):  [40 %] 40 % Set Rate:  [12 bmp-15 bmp] 15 bmp PEEP:  [10 cmH20] 10 cmH20 Plateau Pressure:  [24 cmH20-26 cmH20] 24 cmH20   Intake/Output Summary (Last 24 hours) at 02/13/2020 1251 Last data filed at 02/13/2020 1110 Gross per 24 hour  Intake 2895.87 ml  Output 1999 ml  Net 896.87 ml   Filed Weights   02/13/20 0500 02/13/20 0700 02/13/20 1015  Weight: 123 kg 123 kg 121.5 kg     Examination:  GEN: no acute distress watching TV HEENT: trach in place, bleeding improved CV: RRR, ext warm PULM: shallow inspiratory efforts, nonlabored GI: soft, hypoactive BS EXT: trace anasarca NEURO: moves all 4 ext to command PSYCH: RASS 0 SKIN: Pale, finger ischemic stable  ABG paO2 57 BMP okay CBC stable No new micro data Zosyn ongoing, end date? Fibrinogen 547>>571 LDH 489>>401 PTT 38 Net +1.3L  Resolved Hospital Problem list   MSSA pneumonia Fungal pneumonia- Candida dublinensis Enterococcal bacteremia-recent blood cultures cleared Concern for possible embolic phenomenon- fingers and possibly CVAs were embolic  Assessment & Plan:    Critically ill due to Acute hypoxic and AoC hypercapneic respiratory failure; likely underly OHS requiring VV ECMO support and mechanical ventilation.  ARDS due to COVID 19 pneumonia   Tracheostomy in place; previously had bleeding -Continue ultra lung protective ventilation.  -Continue VV ECMO support - wean sweep again today, check CXR -VAP prevention protocol -Not currently requiring sedation for mechanical ventilation. -Routine trach care -Antimicrobials per ID - IR Consult for PEG-J  Elevated LDH, hypo-fibrinogenemia and thrombocytopenia--improving since circuit change. -Continue to monitor  ICH, multifocal. L-sided weakness.  Critical illness myopathy -Continue PT, OT  Acute metabolic encephalopathy, improved - IV Sedation requirements minimal; only PRN now -Seroquel and melatonin nightly -Delirium precautions   Acute renal failure, severe uremia.  Anuric. -Trial of iHD but if we are not able  to keep up with I/O will have to go back to CRRT -Renally dose medications -Strict I/os  Shock related to  RV strain - now resolved, patient is now hypertension  - Restart midodrine to help iHD tolerance - If still hypertensive off midodrine, would initiate betablocker as agent of choice to decrease CO and improve  oxygenation.   Hyperglycemia > not controlled at all with [place] insulin despite aggressive upward titration.  Assume poor absorption, potentially due to subcutaneous edema.   -Improved on IV, may want to consider another trial of subcutaneous at some point  Best practice:  Diet: tube feeding Pain/Anxiety/Delirium protocol (if indicated): as above VAP protocol (if indicated): yes DVT prophylaxis: SCDs , heparin GI prophylaxis: pantoprazole Glucose control:  insulin infusion  Mobility: bed rest Code Status: limited Family Communication: will update when they come in Disposition: ICU  This patient is critically ill with multiple organ system failure which requires frequent high complexity decision making, assessment, support, evaluation, and titration of therapies. This was completed through the application of advanced monitoring technologies and extensive interpretation of multiple databases. During this encounter critical care time was devoted to patient care services described in this note for 36 minutes.  Candee Furbish, MD 02/13/20 12:51 PM [facility]

## 2020-02-13 NOTE — Progress Notes (Signed)
SLP Cancellation Note  Patient Details Name: Alisha Stephenson MRN: [id number] DOB: 12/09/1965   Cancelled treatment:       Reason Eval/Treat Not Completed: Medical issues which prohibited therapy. SLP continues to follow for potential to try PMV. She remains on vent, making attempts to communicate but PEEP also just a little high for candidacy. Will consider inline PMV as medically able.    Osie Bond., M.A. [facility] Pager [phone] Office [phone]  02/13/2020, 10:17 AM

## 2020-02-13 NOTE — Progress Notes (Signed)
Patient ID: Alisha Stephenson, female   DOB: 05-25-1966, 54 y.o.   MRN: [id number]    Advanced Heart Failure Rounding Note   Subjective:    - 8/2 COVID + test - 8/9 Cannulated for VV ECMO - 8/13 with several areas of intracranial hemorrhage. Bival stopped.  - 8/14 CT no change in [name]. Increased edema - 8/16 Extubated - 8/16 Head CT stable bleed - 8/22 CVVHD started - 8/24 reintubated - 8/24 circuit changed - 8/30 trach - 9/7  Switched to I HD  Remains awake on vent. Will follow commands. Very weak. Unable to move lefts side. Working with PT.   Las night started back on NE for low MAPs in setting of low diastolic BP  Now on iHD,    ECMO   Flow 4.8 L RPM 4000 Sweep13 -> 9 -> 7.5  Labs:  7.35/45/60/89% Fio2 40% Hgb7.5 PLT 102k LDH 450 -> 489  -> 489 -> 401 Lactic acid1.2   Objective:   Weight Range:  Vital Signs:   Temp:  [97.4 F (36.3 C)-99.3 F (37.4 C)] 98.6 F (37 C) (09/07 0700) Pulse Rate:  [82-98] 93 (09/07 0900) Resp:  [18-32] 19 (09/07 0751) BP: (95-166)/(39-57) 127/49 (09/07 0845) SpO2:  [87 %-100 %] 91 % (09/07 0900) Arterial Line BP: (109-292)/(18-283) 126/41 (09/07 0900) FiO2 (%):  [40 %] 40 % (09/07 0800) Weight:  [378 kg] 123 kg (09/07 0700) Last BM Date: 02/12/20  Weight change: Filed Weights   02/12/20 0500 02/13/20 0500 02/13/20 0700  Weight: 124 kg 123 kg 123 kg    Intake/Output:   Intake/Output Summary (Last 24 hours) at 02/13/2020 0901 Last data filed at 02/13/2020 0900 Gross per 24 hour  Intake 2546.95 ml  Output 939 ml  Net 1607.95 ml     Physical Exam: General:  Awake on vent through trach No resp difficulty HEENT: normal Neck: supple. RIJ ECMO cannula. Carotids 2+ bilat; no bruits. No lymphadenopathy or thryomegaly appreciated. Cor: PMI nondisplaced. Regular rate & rhythm. No rubs, gallops or murmurs. Lungs: + rhonchi Abdomen: obese soft, nontender, nondistended. No hepatosplenomegaly. No bruits or masses. Good bowel  sounds. Extremities: no cyanosis, clubbing, rash, tr-1+ edema Neuro: awake. Weak. Unable to move left side   Telemetry: Sinus 90s Personally reviewed   Labs: Basic Metabolic Panel: Recent Labs  Lab 02/09/20 1604 02/09/20 1611 02/10/20 0322 02/10/20 0353 02/11/20 0322 02/11/20 0333 02/11/20 1608 02/11/20 2016 02/12/20 0405 02/12/20 0516 02/12/20 1130 02/12/20 1130 02/12/20 1625 02/12/20 1831 02/12/20 2227 02/13/20 0314 02/13/20 0315 02/13/20 0523 02/13/20 0744  NA 136   < > 135   < > 138   < > 137   < > 137   < > 136   < > 137  137   < > 137 137 137 138 140  K 4.3   < > 4.2   < > 4.0   < > 4.1   < > 4.0   < > 4.0   < > 3.9  4.2   < > 4.8 3.8 3.9 3.8 3.8  CL 102   < > 102   < > 101   < > 102  --  102  --  101  --  101  --   --  100  --   --   --   CO2 22   < > 22   < > 23   < > 23  --  22  --  22  --  24  --   --  23  --   --   --   GLUCOSE 162*   < > 138*   < > 172*   < > 127*  --  126*  --  145*  --  152*  --   --  194*  --   --   --   BUN 30*   < > 25*   < > 28*   < > 20  --  14  --  16  --  15  --   --  28*  --   --   --   CREATININE 1.02*   < > 1.02*   < > 1.06*   < > 1.00  --  0.91  --  0.96  --  0.92  --   --  1.32*  --   --   --   CALCIUM 8.4*   < > 8.5*   < > 8.5*   < > 8.5*  --  8.5*   < > 8.4*  --  8.3*  --   --  8.6*  --   --   --   MG 2.7*  --  2.7*  --  2.7*  --   --   --  2.7*  --   --   --   --   --   --  2.9*  --   --   --   PHOS 4.4   < > 2.5  --  2.7  --   --   --  2.5  --  3.6  --   --   --   --  3.7  --   --   --    < > = values in this interval not displayed.    Liver Function Tests: Recent Labs  Lab 02/12/20 1130  ALBUMIN 2.5*   No results for input(s): LIPASE, AMYLASE in the last 168 hours. No results for input(s): AMMONIA in the last 168 hours.  CBC: Recent Labs  Lab 02/07/20 0311 02/07/20 0326 02/11/20 1608 02/11/20 2016 02/12/20 0405 02/12/20 0516 02/12/20 1122 02/12/20 1122 02/12/20 1625 02/12/20 1831 02/12/20 2227  02/13/20 0314 02/13/20 0315 02/13/20 0523 02/13/20 0744  WBC 13.6*   < > 15.5*  --  13.3*  --  15.7*  --  16.2*  --   --  13.8*  --   --   --   NEUTROABS 10.3*  --   --   --   --   --   --   --   --   --   --   --   --   --   --   HGB 8.7*   < > 8.4*   < > 8.1*   < > 8.3*   < > 8.2*  9.2*   < > 8.2* 7.5* 8.5* 8.5* 8.5*  HCT 27.7*   < > 28.0*   < > 27.3*   < > 28.2*   < > 28.4*  27.0*   < > 24.0* 25.6* 25.0* 25.0* 25.0*  MCV 102.2*   < > 110.2*  --  109.6*  --  111.9*  --  112.3*  --   --  111.8*  --   --   --   PLT 69*   < > 106*  --  104*  --  106*  --  108*  --   --  102*  --   --   --    < > =  values in this interval not displayed.    Cardiac Enzymes: No results for input(s): CKTOTAL, CKMB, CKMBINDEX, TROPONINI in the last 168 hours.  BNP: BNP (last 3 results) No results for input(s): BNP in the last 8760 hours.  ProBNP (last 3 results) No results for input(s): PROBNP in the last 8760 hours.    Other results:  Imaging: DG Chest 1 View  Result Date: 02/12/2020 CLINICAL DATA:  ARDS on ECMO EXAM: CHEST  1 VIEW COMPARISON:  Yesterday FINDINGS: ECMO catheter in stable position. The tracheostomy tube, bilateral central lines, and enteric tube are unremarkable. The enteric tube is now a gastric suction tube instead of a feeding tube. Unchanged extensive pulmonary opacity. No visible air leak. Stable heart size. IMPRESSION: Unremarkable hardware positioning and stable confluent airspace disease. Electronically Signed   By: Monte Fantasia M.D.   On: 02/12/2020 09:49   DG CHEST PORT 1 VIEW  Result Date: 02/13/2020 CLINICAL DATA:  Respiratory failure.  Recent COVID. EXAM: PORTABLE CHEST 1 VIEW COMPARISON:  02/12/2020 FINDINGS: Tracheostomy, enteric tube, and central lines are unchanged in position. Diffuse airspace consolidation in both lungs without change. Heart size is obscured. IMPRESSION: Diffuse airspace consolidation in both lungs without change. Electronically Signed   By: Lucienne Capers M.D.   On: 02/13/2020 06:29   DG Abd Portable 1V  Result Date: 02/12/2020 CLINICAL DATA:  Feeding tube dysfunction EXAM: PORTABLE ABDOMEN - 1 VIEW COMPARISON:  02/11/2020 FINDINGS: Nonobstructive bowel gas pattern. Enteric tube terminates in the mid gastric body. Additional feeding tube terminates in the distal gastric body and is looped in the stomach. Cholecystectomy clips. ECMO catheter. IMPRESSION: Nonobstructive bowel gas pattern. Enteric tube terminates in the mid gastric body. Additional feeding tube terminates in the distal gastric body and is looped in the stomach. Electronically Signed   By: Julian Hy M.D.   On: 02/12/2020 07:24   DG Abd Portable 1V  Result Date: 02/11/2020 CLINICAL DATA:  Emesis. COVID-19 infection. Patient is currently on ECMO. EXAM: PORTABLE ABDOMEN - 1 VIEW COMPARISON:  02/09/2020; 01/30/2020; chest radiograph-02/11/2020; 02/10/2020 FINDINGS: Redemonstrated mild gaseous distension of multiple loops of large and small bowel. There are two mildly dilated loops of small bowel within the midline of the upper abdomen which demonstrate potential minimal amount of bowel wall thickening. No pneumoperitoneum, pneumatosis or portal venous gas. Limited visualization of lower thorax demonstrates extensive bilateral consolidative opacities. Enteric tube tips project over the gastric antrum. ECMO cannulation device tip is unchanged in positioning overlying the T12 vertebral body. Post cholecystectomy. Radiopaque structure overlying the lower pelvis is likely external to the patient. No acute osseous abnormalities. Degenerative change of the bilateral hips. IMPRESSION: Similar findings most suggestive of ileus though there are 2 loops of small bowel which demonstrate potential bowel wall thickening as could be seen in the setting of an enteritis either of infectious, inflammatory or ischemic etiology. No pneumoperitoneum pneumatosis. Electronically Signed   By: Sandi Mariscal M.D.    On: 02/11/2020 09:57     Medications:     Scheduled Medications: . amiodarone  200 mg Per Tube Daily  . vitamin C  500 mg Per Tube Daily  . B-complex with vitamin C  1 tablet Oral Daily  . chlorhexidine gluconate (MEDLINE KIT)  15 mL Mouth Rinse BID  . Chlorhexidine Gluconate Cloth  6 each Topical Q0600  . clonazePAM  1 mg Per Tube Q6H  . docusate  100 mg Per Tube BID  . feeding supplement (PROSource TF)  45  mL Per Tube TID  . fentaNYL (SUBLIMAZE) injection  50 mcg Intravenous Once  . heparin sodium (porcine)      . linagliptin  5 mg Per Tube Daily  . mouth rinse  15 mL Mouth Rinse 10 times per day  . melatonin  9 mg Per Tube QHS  . midodrine  10 mg Per Tube TID  . oxyCODONE  10 mg Per Tube Q6H  . pantoprazole (PROTONIX) IV  40 mg Intravenous BID  . polyethylene glycol  17 g Per Tube Daily  . QUEtiapine  50 mg Per Tube QHS  . sennosides  5 mL Per Tube BID  . zinc sulfate  220 mg Per Tube Daily    Infusions: . sodium chloride 500 mL (02/11/20 2215)  . sodium chloride 10 mL/hr at 02/13/20 0900  . sodium chloride    . sodium chloride    . albumin human 12.5 g (02/12/20 2231)  . albumin human Stopped (02/06/20 2000)  . feeding supplement (PIVOT 1.5 CAL) 20 mL/hr at 02/12/20 2000  . heparin 500 Units/hr (02/13/20 0900)  . insulin 0.4 mL/hr at 02/13/20 0900  . norepinephrine (LEVOPHED) Adult infusion    . phenylephrine (NEO-SYNEPHRINE) Adult infusion Stopped (02/13/20 0155)  . piperacillin-tazobactam (ZOSYN)  IV Stopped (02/13/20 4128)    PRN Medications: sodium chloride, sodium chloride, sodium chloride, sodium chloride, acetaminophen (TYLENOL) oral liquid 160 mg/5 mL, albumin human, albuterol, alteplase, alum & mag hydroxide-simeth, dextrose, fentaNYL (SUBLIMAZE) injection, guaiFENesin-dextromethorphan, heparin, heparin, hydrALAZINE, HYDROmorphone (DILAUDID) injection, hydrOXYzine, lidocaine (PF), lidocaine-prilocaine, ondansetron **OR** ondansetron (ZOFRAN) IV,  pentafluoroprop-tetrafluoroeth   Assessment/Plan:   1. Acute hypoxic/hypercapneic respiratory failure in setting of severe COVID PNA/ARDS -> VV ECMO - admit 8/2 - intubation 8/3 - has received actmera (compelted 8/2), remdesivir (completed 8/6) and steroids - Cannulated for VV ECMO on 8/9 - Extubated 8/16. Reintubated 8/24 - s/p trach 8/30. - Circuit changed 8/24 due to concern for hemolysis and worsening oxygenation. - CVVHD started 8/22 for volume removal and uremia. Remains anuric.Volume status improved. Keep mildly negative. Starting iHD. Will pull 1.5L today. D/w Renal  - Remains awake on vent  Has grown increasingly weak. Suspect severe critical illness myopathy. PT seeing. She will have a long road.  - Remains on low-dose heparin. Numbers look good. ,dbph - CXR improved. TVs improving on vent. ABG ok. Continue to wean sweep - Continue melatonin and seroquel for sleep at night  - Back on NE in setting of wide pulse pressure. Concern for aortic valve insufficiency with possible previous vegetation. Will get echo today, may need TEE  2. Enterococcus sepsis - remain on high-dose zosyn (endstoday) per ID - Eraxis added on 8/26 with yeast in BAL 8/24 (ended 9/2) Discussed dosing with PharmD personally. - Cultures repeated last night with increasing pressor demands  3. Intracranial hemorrhage - ? Septic emboli - repeat head CT on 8/14 with stable bleeds but increased edema - neurology has seen. Suspect significant long-term injury sustained. Will follow commands. Appears to have dense LUE weakness and possibly LLE - repeat head CT stable 8/16 - tolerating low-dose heparin - Back on NE in setting of wide pulse pressure. Concern for aortic valve insufficiency with possible previous vegetation. Will get echo today, may need TEE  4. Thrombocytopenia - PLTs up > 100k - Continue to follow  5. Morbid obesity - Body mass index is Body mass index is 46.55 kg/m.  6. Poorly controlled  DM2 - HgBA1c 10.7 - CBGs remain elevated - On high-dose IV  insulin and sugars still high  7. PAF - intermittent episodes. Last 8/26 - Now on po. Quiescent  8. AKI/azotemia - CVVHD started on 8/22 - Down 40+ pounds.  - Keep slightly negative - Remains anuric. No change. - D/w Renal. Possible transition to iHD  10. Ischemic R fingers - ? Due to septic embolic versus vascular issue - Improving  11. Acute liver failure - has underlying fatty liver - mix of conjugated/unconjugated - LFTs trending back down  12. Emesis - Cor-trak in stomach. Will d/c - On trickle TFs - Continue prokinetics - Likely needs PEG. Consult IR  CRITICAL CARE Performed by: Glori Bickers  Total critical care time: 35 minutes  Critical care time was exclusive of separately billable procedures and treating other patients.  Critical care was necessary to treat or prevent imminent or life-threatening deterioration.  Critical care was time spent personally by me (independent of midlevel providers or residents) on the following activities: development of treatment plan with patient and/or surrogate as well as nursing, discussions with consultants, evaluation of patient's response to treatment, examination of patient, obtaining history from patient or surrogate, ordering and performing treatments and interventions, ordering and review of laboratory studies, ordering and review of radiographic studies, pulse oximetry and re-evaluation of patient's condition.   Length of Stay: 39   Glori Bickers  MD 02/13/2020, 9:01 AM  Advanced Heart Failure Team Pager [phone] (M-F; 7a - 4p)  Please contact [facility] for night-coverage after hours (4p -7a ) and weekends on [email or web address]

## 2020-02-13 NOTE — Progress Notes (Signed)
[facility] Progress Note    Assessment/ Plan:   1.  Anuric AKI due to shock, sepsis causing acute tubular necrosis: Started CRRT on 8/22 for uremia and volume overload. 02/12/20. IHD today.    2.Shock due to sepsis, Covid infection, blood cultures positive for Enterococcus bacteremia: on Zosyn, cultures repeated 9/7.  3.Acute hypoxic and hypercapnic respiratory failure, ARDS due to COVID-19 pneumonia: Currently on ECMO for refractory hypoxemia  On broad-spectrum antibiotics.  IV Zosyn and IV Eraxis appreciate assistance from infectious disease.  Status post tracheostomy 02/05/2020.  Respiratory cultures have been positive for MSSA and Candida Dublinensis.  3.Hypernatremia, hypervolemia: relative free water deficit. Improved with CRRT.  4.Acute toxic metabolic encephalopathy:Multifactorial with uremia contributing. Treatment with CRRT as above.  5.Anemia, thrombocytopenia: With hemolysis likely and acute illness contributing. Transfusions per primary team.Seen by hematologist.  6.Intracranial hemorrhage: With left-sided weakness  7. GOC: Multiorgan failure.   family meeting on 8/25, continue full scope of treatment. Long-term prognosis poor.  8.  Diabetes per primary  9.  Hypophosphatemia improved  Subjective:    Levophed started back last night.  On iHD today, getting 1 u pRBCS.      Objective:   BP (!) 127/53   Pulse 92   Temp 98.6 F (37 C) (Axillary)   Resp 19   Ht _0  (1.626 m)   Wt 123 kg   SpO2 90%   BMI 46.55 kg/m   Intake/Output Summary (Last 24 hours) at 02/13/2020 9622 Last data filed at 02/13/2020 0915 Gross per 24 hour  Intake 2746.95 ml  Output 939 ml  Net 1807.95 ml   Weight change: -1 kg  Physical Exam: Gen: critically ill, lying in bed CVS: RRR Resp: mech bilaterally Abd: obese Ext: 1+ edema bilaterally  Imaging: DG Chest 1 View  Result Date: 02/12/2020 CLINICAL DATA:  ARDS on ECMO EXAM: CHEST  1 VIEW  COMPARISON:  Yesterday FINDINGS: ECMO catheter in stable position. The tracheostomy tube, bilateral central lines, and enteric tube are unremarkable. The enteric tube is now a gastric suction tube instead of a feeding tube. Unchanged extensive pulmonary opacity. No visible air leak. Stable heart size. IMPRESSION: Unremarkable hardware positioning and stable confluent airspace disease. Electronically Signed   By: Monte Fantasia M.D.   On: 02/12/2020 09:49   DG CHEST PORT 1 VIEW  Result Date: 02/13/2020 CLINICAL DATA:  Respiratory failure.  Recent COVID. EXAM: PORTABLE CHEST 1 VIEW COMPARISON:  02/12/2020 FINDINGS: Tracheostomy, enteric tube, and central lines are unchanged in position. Diffuse airspace consolidation in both lungs without change. Heart size is obscured. IMPRESSION: Diffuse airspace consolidation in both lungs without change. Electronically Signed   By: Lucienne Capers M.D.   On: 02/13/2020 06:29   DG Abd Portable 1V  Result Date: 02/12/2020 CLINICAL DATA:  Feeding tube dysfunction EXAM: PORTABLE ABDOMEN - 1 VIEW COMPARISON:  02/11/2020 FINDINGS: Nonobstructive bowel gas pattern. Enteric tube terminates in the mid gastric body. Additional feeding tube terminates in the distal gastric body and is looped in the stomach. Cholecystectomy clips. ECMO catheter. IMPRESSION: Nonobstructive bowel gas pattern. Enteric tube terminates in the mid gastric body. Additional feeding tube terminates in the distal gastric body and is looped in the stomach. Electronically Signed   By: Julian Hy M.D.   On: 02/12/2020 07:24   DG Abd Portable 1V  Result Date: 02/11/2020 CLINICAL DATA:  Emesis. COVID-19 infection. Patient is currently on ECMO. EXAM: PORTABLE ABDOMEN - 1 VIEW COMPARISON:  02/09/2020; 01/30/2020; chest radiograph-02/11/2020; 02/10/2020  FINDINGS: Redemonstrated mild gaseous distension of multiple loops of large and small bowel. There are two mildly dilated loops of small bowel within the  midline of the upper abdomen which demonstrate potential minimal amount of bowel wall thickening. No pneumoperitoneum, pneumatosis or portal venous gas. Limited visualization of lower thorax demonstrates extensive bilateral consolidative opacities. Enteric tube tips project over the gastric antrum. ECMO cannulation device tip is unchanged in positioning overlying the T12 vertebral body. Post cholecystectomy. Radiopaque structure overlying the lower pelvis is likely external to the patient. No acute osseous abnormalities. Degenerative change of the bilateral hips. IMPRESSION: Similar findings most suggestive of ileus though there are 2 loops of small bowel which demonstrate potential bowel wall thickening as could be seen in the setting of an enteritis either of infectious, inflammatory or ischemic etiology. No pneumoperitoneum pneumatosis. Electronically Signed   By: Sandi Mariscal M.D.   On: 02/11/2020 09:57    Labs: [employer] Recent Labs  Lab 02/09/20 0341 02/09/20 0352 02/09/20 1604 02/09/20 1611 02/10/20 0322 02/10/20 0353 02/10/20 1701 02/10/20 1710 02/11/20 0322 02/11/20 0333 02/11/20 1608 02/11/20 2016 02/12/20 0405 02/12/20 0516 02/12/20 1130 02/12/20 1130 02/12/20 1625 02/12/20 1625 02/12/20 1831 02/12/20 2022 02/12/20 2227 02/13/20 0314 02/13/20 0315 02/13/20 0523 02/13/20 0744  NA 137   < > 136   < > 135   < > 138   < > 138   < > 137   < > 137   < > 136   < > 137  137   < > 137 138 137 137 137 138 140  K 4.1   < > 4.3   < > 4.2   < > 4.2   < > 4.0   < > 4.1   < > 4.0   < > 4.0   < > 3.9  4.2   < > 4.0 3.9 4.8 3.8 3.9 3.8 3.8  CL 102   < > 102   < > 102   < > 104  --  101  --  102  --  102  --  101  --  101  --   --   --   --  100  --   --   --   CO2 24   < > 22   < > 22   < > 24  --  23  --  23  --  22  --  22  --  24  --   --   --   --  23  --   --   --   GLUCOSE 215*   < > 162*   < > 138*   < > 186*  --  172*  --  127*  --  126*  --  145*  --  152*  --   --   --   --  194*   --   --   --   BUN 33*   < > 30*   < > 25*   < > 25*  --  28*  --  20  --  14  --  16  --  15  --   --   --   --  28*  --   --   --   CREATININE 0.94   < > 1.02*   < > 1.02*   < > 1.09*  --  1.06*  --  1.00  --  0.91  --  0.96  --  0.92  --   --   --   --  1.32*  --   --   --   CALCIUM 8.1*   < > 8.4*   < > 8.5*   < > 8.4*  --  8.5*  --  8.5*  --  8.5*  --  8.4*  --  8.3*  --   --   --   --  8.6*  --   --   --   PHOS 2.2*  --  4.4  --  2.5  --   --   --  2.7  --   --   --  2.5  --  3.6  --   --   --   --   --   --  3.7  --   --   --    < > = values in this interval not displayed.   CBC Recent Labs  Lab 02/07/20 0311 02/07/20 0326 02/12/20 0405 02/12/20 0516 02/12/20 1122 02/12/20 1122 02/12/20 1625 02/12/20 1831 02/13/20 0314 02/13/20 0315 02/13/20 0523 02/13/20 0744  WBC 13.6*   < > 13.3*  --  15.7*  --  16.2*  --  13.8*  --   --   --   NEUTROABS 10.3*  --   --   --   --   --   --   --   --   --   --   --   HGB 8.7*   < > 8.1*   < > 8.3*   < > 8.2*  9.2*   < > 7.5* 8.5* 8.5* 8.5*  HCT 27.7*   < > 27.3*   < > 28.2*   < > 28.4*  27.0*   < > 25.6* 25.0* 25.0* 25.0*  MCV 102.2*   < > 109.6*  --  111.9*  --  112.3*  --  111.8*  --   --   --   PLT 69*   < > 104*  --  106*  --  108*  --  102*  --   --   --    < > = values in this interval not displayed.    Medications:    . sodium chloride   Intravenous Once  . amiodarone  200 mg Per Tube Daily  . vitamin C  500 mg Per Tube Daily  . B-complex with vitamin C  1 tablet Oral Daily  . chlorhexidine gluconate (MEDLINE KIT)  15 mL Mouth Rinse BID  . Chlorhexidine Gluconate Cloth  6 each Topical Q0600  . clonazePAM  1 mg Per Tube Q6H  . docusate  100 mg Per Tube BID  . feeding supplement (PROSource TF)  45 mL Per Tube TID  . fentaNYL (SUBLIMAZE) injection  50 mcg Intravenous Once  . heparin sodium (porcine)      . linagliptin  5 mg Per Tube Daily  . mouth rinse  15 mL Mouth Rinse 10 times per day  . melatonin  9 mg Per Tube QHS  .  midodrine  10 mg Per Tube TID  . oxyCODONE  10 mg Per Tube Q6H  . pantoprazole (PROTONIX) IV  40 mg Intravenous BID  . polyethylene glycol  17 g Per Tube Daily  . QUEtiapine  50 mg Per Tube QHS  . sennosides  5 mL Per Tube BID  . zinc sulfate  220 mg Per Tube Daily  Madelon Lips MD 02/13/2020, 9:29 AM

## 2020-02-13 NOTE — Progress Notes (Signed)
Physical Therapy Treatment Patient Details Name: Alisha Stephenson MRN: [id number] DOB: 1965-10-13 Today's Date: 02/13/2020    History of Present Illness 54 yo admitted 8/2 with hypoxemia due to Covid 19 PNA. 8/3 intubated. 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 CRRT initiated 8/22. Reintubated 8/24 with ECMO circuit changed, 8/30 trach. PMHx: DM, diverticulitis, Asthma    PT Comments    Pt just finished with bathing prior to session with rolling, maxisky pad placed and pt lifted for transition of Kreg beds due to tilt function not working. Once bed transitioned pt able to tilt to 30 degrees for 4 min then 40 degrees for additional 6 min. Pt with clear fatigue with standing at end as unable to maintain eyes open with VSS. In standing pt with decreased RUE activation but assume at least in part due to gravity resisted and fatigue. Pt placed in chair position with pillow under left hip and bil PRAFO on end of session. Encouraged additional tilts.  BP supine 109/48 In 30 degree tilt 140/37 HR 92, 90% SpO2 with FIO2 40% on pressure control     Follow Up Recommendations  LTACH;Supervision/Assistance - 24 hour     Equipment Recommendations  Other (comment) (TBD)    Recommendations for Other Services       Precautions / Restrictions Precautions Precautions: Other (comment) Precaution Comments: ECMO with RIJ access anchors at right shoulder and chest, right necrotic thumb and index finger, bruised/necrotic left hand, trach, vent, flexiseal Other Brace: PRAFO    Mobility  Bed Mobility Overal bed mobility: Needs Assistance Bed Mobility: Rolling Rolling: Total assist;+2 for safety/equipment;+2 for physical assistance         General bed mobility comments: total +2 assist to roll bil x 2, slide in bed  Transfers                 General transfer comment: tilt to 40  Ambulation/Gait                 Stairs              Wheelchair Mobility    Modified Rankin (Stroke Patients Only)       Balance               Standing balance comment: left lean in tilt without attempts to use RUE to correct                            Cognition Arousal/Alertness: Awake/alert Behavior During Therapy: Flat affect Overall Cognitive Status: Difficult to assess                                 General Comments: pt smiling and mouthing words at times but more fatigued today with lots of movement prior to and during session with limited command following to only gripping with RUE      Exercises General Exercises - Upper Extremity Shoulder Flexion: Standing;10 reps;Left;PROM Elbow Flexion: PROM;Both;Standing;10 reps Elbow Extension: PROM;Both;Standing;10 reps Wrist Flexion: PROM;Both;Standing;10 reps Wrist Extension: PROM;Both;Standing;10 reps    General Comments        Pertinent Vitals/Pain Pain Assessment: No/denies pain    Home Living                      Prior Function  PT Goals (current goals can now be found in the care plan section) Progress towards PT goals: Progressing toward goals    Frequency    Min 3X/week      PT Plan Current plan remains appropriate    Co-evaluation              AM-PAC PT "6 Clicks" Mobility   Outcome Measure  Help needed turning from your back to your side while in a flat bed without using bedrails?: Total Help needed moving from lying on your back to sitting on the side of a flat bed without using bedrails?: Total Help needed moving to and from a bed to a chair (including a wheelchair)?: Total Help needed standing up from a chair using your arms (e.g., wheelchair or bedside chair)?: Total Help needed to walk in hospital room?: Total Help needed climbing 3-5 steps with a railing? : Total 6 Click Score: 6    End of Session   Activity Tolerance: Patient tolerated treatment well Patient left: in  bed;with nursing/sitter in room Nurse Communication: Mobility status;Need for lift equipment PT Visit Diagnosis: Other abnormalities of gait and mobility (R26.89);Muscle weakness (generalized) (M62.81)     Time: 6546-5035 PT Time Calculation (min) (ACUTE ONLY): 41 min  Charges:  $Therapeutic Exercise: 8-22 mins $Therapeutic Activity: 23-37 mins                     Jariana Shumard P, PT Acute Rehabilitation Services Pager: [phone] Office: [place] 02/13/2020, 1:42 PM

## 2020-02-14 ENCOUNTER — Inpatient Hospital Stay (HOSPITAL_COMMUNITY): Payer: PRIVATE HEALTH INSURANCE

## 2020-02-14 LAB — CBC
HCT: 28.2 % — ABNORMAL LOW (ref 36.0–46.0)
HCT: 28.2 % — ABNORMAL LOW (ref 36.0–46.0)
Hemoglobin: 8.5 g/dL — ABNORMAL LOW (ref 12.0–15.0)
Hemoglobin: 8.6 g/dL — ABNORMAL LOW (ref 12.0–15.0)
MCH: 32.9 pg (ref 26.0–34.0)
MCH: 33.7 pg (ref 26.0–34.0)
MCHC: 30.1 g/dL (ref 30.0–36.0)
MCHC: 30.5 g/dL (ref 30.0–36.0)
MCV: 109.3 fL — ABNORMAL HIGH (ref 80.0–100.0)
MCV: 110.6 fL — ABNORMAL HIGH (ref 80.0–100.0)
Platelets: 104 10*3/uL — ABNORMAL LOW (ref 150–400)
Platelets: 94 10*3/uL — ABNORMAL LOW (ref 150–400)
RBC: 2.58 MIL/uL — ABNORMAL LOW (ref 3.87–5.11)
RBC: 2.55 MIL/uL — ABNORMAL LOW (ref 3.87–5.11)
RDW: 29.4 % — ABNORMAL HIGH (ref 11.5–15.5)
RDW: 29.4 % — ABNORMAL HIGH (ref 11.5–15.5)
WBC: 12.5 10*3/uL — ABNORMAL HIGH (ref 4.0–10.5)
WBC: 11.6 10*3/uL — ABNORMAL HIGH (ref 4.0–10.5)
nRBC: 2.7 % — ABNORMAL HIGH (ref 0.0–0.2)
nRBC: 2 % — ABNORMAL HIGH (ref 0.0–0.2)

## 2020-02-14 LAB — GLUCOSE, CAPILLARY
Glucose-Capillary: 115 mg/dL — ABNORMAL HIGH (ref 70–99)
Glucose-Capillary: 133 mg/dL — ABNORMAL HIGH (ref 70–99)
Glucose-Capillary: 142 mg/dL — ABNORMAL HIGH (ref 70–99)
Glucose-Capillary: 146 mg/dL — ABNORMAL HIGH (ref 70–99)
Glucose-Capillary: 152 mg/dL — ABNORMAL HIGH (ref 70–99)
Glucose-Capillary: 153 mg/dL — ABNORMAL HIGH (ref 70–99)
Glucose-Capillary: 157 mg/dL — ABNORMAL HIGH (ref 70–99)
Glucose-Capillary: 162 mg/dL — ABNORMAL HIGH (ref 70–99)
Glucose-Capillary: 164 mg/dL — ABNORMAL HIGH (ref 70–99)
Glucose-Capillary: 168 mg/dL — ABNORMAL HIGH (ref 70–99)
Glucose-Capillary: 176 mg/dL — ABNORMAL HIGH (ref 70–99)
Glucose-Capillary: 179 mg/dL — ABNORMAL HIGH (ref 70–99)
Glucose-Capillary: 181 mg/dL — ABNORMAL HIGH (ref 70–99)
Glucose-Capillary: 181 mg/dL — ABNORMAL HIGH (ref 70–99)
Glucose-Capillary: 187 mg/dL — ABNORMAL HIGH (ref 70–99)
Glucose-Capillary: 188 mg/dL — ABNORMAL HIGH (ref 70–99)
Glucose-Capillary: 188 mg/dL — ABNORMAL HIGH (ref 70–99)

## 2020-02-14 LAB — POCT I-STAT 7, (LYTES, BLD GAS, ICA,H+H)
Acid-base deficit: 1 mmol/L (ref 0.0–2.0)
Acid-base deficit: 2 mmol/L (ref 0.0–2.0)
Acid-base deficit: 1 mmol/L (ref 0.0–2.0)
Bicarbonate: 24.1 mmol/L (ref 20.0–28.0)
Bicarbonate: 23.1 mmol/L (ref 20.0–28.0)
Bicarbonate: 23.9 mmol/L (ref 20.0–28.0)
Calcium, Ion: 1.14 mmol/L — ABNORMAL LOW (ref 1.15–1.40)
Calcium, Ion: 1.15 mmol/L (ref 1.15–1.40)
Calcium, Ion: 1.17 mmol/L (ref 1.15–1.40)
HCT: 27 % — ABNORMAL LOW (ref 36.0–46.0)
HCT: 28 % — ABNORMAL LOW (ref 36.0–46.0)
HCT: 26 % — ABNORMAL LOW (ref 36.0–46.0)
Hemoglobin: 9.2 g/dL — ABNORMAL LOW (ref 12.0–15.0)
Hemoglobin: 9.5 g/dL — ABNORMAL LOW (ref 12.0–15.0)
Hemoglobin: 8.8 g/dL — ABNORMAL LOW (ref 12.0–15.0)
O2 Saturation: 85 %
O2 Saturation: 91 %
O2 Saturation: 90 %
Patient temperature: 99.5
Patient temperature: 98.9
Patient temperature: 99.1
Potassium: 4 mmol/L (ref 3.5–5.1)
Potassium: 4.1 mmol/L (ref 3.5–5.1)
Potassium: 4.2 mmol/L (ref 3.5–5.1)
Sodium: 138 mmol/L (ref 135–145)
Sodium: 139 mmol/L (ref 135–145)
Sodium: 140 mmol/L (ref 135–145)
TCO2: 25 mmol/L (ref 22–32)
TCO2: 24 mmol/L (ref 22–32)
TCO2: 25 mmol/L (ref 22–32)
pCO2 arterial: 43.1 mmHg (ref 32.0–48.0)
pCO2 arterial: 42.7 mmHg (ref 32.0–48.0)
pCO2 arterial: 41.1 mmHg (ref 32.0–48.0)
pH, Arterial: 7.358 (ref 7.350–7.450)
pH, Arterial: 7.342 — ABNORMAL LOW (ref 7.350–7.450)
pH, Arterial: 7.374 (ref 7.350–7.450)
pO2, Arterial: 54 mmHg — ABNORMAL LOW (ref 83.0–108.0)
pO2, Arterial: 66 mmHg — ABNORMAL LOW (ref 83.0–108.0)
pO2, Arterial: 60 mmHg — ABNORMAL LOW (ref 83.0–108.0)

## 2020-02-14 LAB — BPAM RBC
Blood Product Expiration Date: 202109242359
Blood Product Expiration Date: 202109262359
Blood Product Expiration Date: 202109282359
Blood Product Expiration Date: 202110012359
Blood Product Expiration Date: 202110052359
ISSUE DATE / TIME: 202108300821
ISSUE DATE / TIME: 202109070939
ISSUE DATE / TIME: 202109080123
ISSUE DATE / TIME: 202109080139
ISSUE DATE / TIME: 202109080408
Unit Type and Rh: 5100
Unit Type and Rh: 5100
Unit Type and Rh: 5100
Unit Type and Rh: 5100
Unit Type and Rh: 5100

## 2020-02-14 LAB — BASIC METABOLIC PANEL
Anion gap: 14 (ref 5–15)
Anion gap: 16 — ABNORMAL HIGH (ref 5–15)
BUN: 31 mg/dL — ABNORMAL HIGH (ref 6–20)
BUN: 47 mg/dL — ABNORMAL HIGH (ref 6–20)
CO2: 22 mmol/L (ref 22–32)
CO2: 21 mmol/L — ABNORMAL LOW (ref 22–32)
Calcium: 8.5 mg/dL — ABNORMAL LOW (ref 8.9–10.3)
Calcium: 8.7 mg/dL — ABNORMAL LOW (ref 8.9–10.3)
Chloride: 100 mmol/L (ref 98–111)
Chloride: 101 mmol/L (ref 98–111)
Creatinine, Ser: 1.77 mg/dL — ABNORMAL HIGH (ref 0.44–1.00)
Creatinine, Ser: 2.42 mg/dL — ABNORMAL HIGH (ref 0.44–1.00)
GFR calc Af Amer: 37 mL/min — ABNORMAL LOW (ref >60)
GFR calc Af Amer: 25 mL/min — ABNORMAL LOW (ref >60)
GFR calc non Af Amer: 32 mL/min — ABNORMAL LOW (ref >60)
GFR calc non Af Amer: 22 mL/min — ABNORMAL LOW (ref >60)
Glucose, Bld: 178 mg/dL — ABNORMAL HIGH (ref 70–99)
Glucose, Bld: 153 mg/dL — ABNORMAL HIGH (ref 70–99)
Potassium: 3.8 mmol/L (ref 3.5–5.1)
Potassium: 4.1 mmol/L (ref 3.5–5.1)
Sodium: 136 mmol/L (ref 135–145)
Sodium: 138 mmol/L (ref 135–145)

## 2020-02-14 LAB — TYPE AND SCREEN
ABO/RH(D): O POS
Antibody Screen: NEGATIVE
Unit division: 0
Unit division: 0
Unit division: 0
Unit division: 0
Unit division: 0

## 2020-02-14 LAB — LACTATE DEHYDROGENASE: LDH: 376 U/L — ABNORMAL HIGH (ref 98–192)

## 2020-02-14 LAB — HEPATITIS B E ANTIBODY: Hep B E Ab: NEGATIVE

## 2020-02-14 LAB — PHOSPHORUS: Phosphorus: 3.6 mg/dL (ref 2.5–4.6)

## 2020-02-14 LAB — LACTIC ACID, PLASMA
Lactic Acid, Venous: 1.4 mmol/L (ref 0.5–1.9)
Lactic Acid, Venous: 1.5 mmol/L (ref 0.5–1.9)

## 2020-02-14 LAB — FIBRINOGEN: Fibrinogen: 481 mg/dL — ABNORMAL HIGH (ref 210–475)

## 2020-02-14 LAB — APTT
aPTT: 38 seconds — ABNORMAL HIGH (ref 24–36)
aPTT: 38 seconds — ABNORMAL HIGH (ref 24–36)

## 2020-02-14 LAB — HEPARIN LEVEL (UNFRACTIONATED): Heparin Unfractionated: <0.1 IU/mL — ABNORMAL LOW (ref 0.30–0.70)

## 2020-02-14 MED ORDER — RENA-VITE PO TABS: 1.0000 | ORAL_TABLET | Freq: Every day | ORAL | Status: DC

## 2020-02-14 MED ORDER — B COMPLEX-C PO TABS: 1.0000 | ORAL_TABLET | Freq: Every day | ORAL | Status: DC

## 2020-02-14 MED ORDER — GERHARDT'S BUTT CREAM
TOPICAL_CREAM | Freq: Two times a day (BID) | CUTANEOUS | Status: DC
  Administered 2020-02-16 – 2020-02-29 (×7): 1 via TOPICAL
  Filled 2020-02-14 (×3): qty 1

## 2020-02-14 MED ORDER — PIVOT 1.5 CAL PO LIQD
1000.0000 mL | ORAL | Status: DC
  Administered 2020-02-14: 1000 mL
  Filled 2020-02-14 (×2): qty 1000

## 2020-02-14 MED ORDER — ALUM & MAG HYDROXIDE-SIMETH 200-200-20 MG/5ML PO SUSP: 30.0000 mL | Freq: Four times a day (QID) | ORAL | Status: DC | PRN

## 2020-02-14 MED ORDER — CLOTRIMAZOLE 1 % VA CREA
1.0000 | TOPICAL_CREAM | Freq: Every day | VAGINAL | Status: AC
  Administered 2020-02-14 – 2020-02-20 (×7): 1 via VAGINAL
  Filled 2020-02-14 (×3): qty 45

## 2020-02-14 MED ORDER — RENA-VITE PO TABS
1.0000 | ORAL_TABLET | Freq: Every day | ORAL | Status: DC
  Administered 2020-02-14 – 2020-03-03 (×19): 1
  Filled 2020-02-14 (×19): qty 1

## 2020-02-14 MED ORDER — FLUCONAZOLE 150 MG PO TABS
150.0000 mg | ORAL_TABLET | Freq: Once | ORAL | Status: DC
  Filled 2020-02-14: qty 1

## 2020-02-14 MED ORDER — FLUCONAZOLE 150 MG PO TABS
150.0000 mg | ORAL_TABLET | Freq: Once | ORAL | Status: AC
  Administered 2020-02-14: 150 mg
  Filled 2020-02-14: qty 1

## 2020-02-14 MED ORDER — PROSOURCE TF PO LIQD
45.0000 mL | Freq: Four times a day (QID) | ORAL | Status: DC
  Administered 2020-02-14 – 2020-03-04 (×73): 45 mL
  Filled 2020-02-14 (×74): qty 45

## 2020-02-14 NOTE — Progress Notes (Signed)
ANTICOAGULATION CONSULT NOTE - [place] for Heparin Indication: ECMO  Allergies  Allergen Reactions  . Sulfa Antibiotics Rash and Other (See Comments)    Patient Measurements: Height: 5\' 4"  (162.6 cm) Weight: 119.6 kg (263 lb 10.7 oz) IBW/kg (Calculated) : 54.7 Heparin Dosing Weight: 87 kg  Vital Signs: Temp: 99.5 F (37.5 C) (09/08 0400) Temp Source: Axillary (09/08 0400) BP: 119/51 (09/08 0700) Pulse Rate: 98 (09/08 0700)  Labs: Recent Labs    02/12/20 0521 02/12/20 0831 02/13/20 0314 02/13/20 0315 02/13/20 1611 02/13/20 1723 02/13/20 2228 02/13/20 2228 02/14/20 0249 02/14/20 0406  HGB  --    < > 7.5*   < > 8.7*   < > 9.2*   < > 8.5* 9.2*  HCT  --    < > 25.6*   < > 28.5*   < > 27.0*  --  28.2* 27.0*  PLT  --    < > 102*  --  94*  --   --   --  104*  --   APTT 40*   < > 38*  --  41*  --   --   --  38*  --   HEPARINUNFRC <0.10*  --  <0.10*  --   --   --   --   --  <0.10*  --   CREATININE  --    < > 1.32*  --  1.30*  --   --   --  1.77*  --    < > = values in this interval not displayed.    Estimated Creatinine Clearance: 45.8 mL/min (A) (by C-G formula based on SCr of 1.77 mg/dL (H)).   Medical History: Past Medical History:  Diagnosis Date  . Asthma   . Diabetes mellitus without complication ([place])   . Diverticulitis   . Gallstones   . IBS (irritable bowel syndrome)   . NAFLD (nonalcoholic fatty liver disease)    CT scan 2015  . Ovarian cyst     Assessment: 54 yo female who is COVID+ on VV ECMO. Patient was started on bivalirudin but was found to have multiple ICH on 8/13 head CT. Bivalirudin was stopped at this time and pt has been off anticoagulation. Her ECMO circuit was changed 8/24, and pharmacy asked to start low-fixed rate heparin.  Heparin infusion was held last week due to bleeding from trach site. Hgb stable at 9.2, plt 104, fibrinogen 481, LDH 376. ECMO circuit is stable - mild clots in circuit corners - s/p  circuit change   Heparin at 500 units/hr with plans to not titrate for now. Heparin level remains undetectable, aPTT 38- has had change in mental status.  Goal of Therapy:  Heparin level 0.2-0.5 units/ml - currently not titrating to goal per discussion with ECMO team Monitor platelets by anticoagulation protocol: Yes   Plan:  Continue Heparin infusion at 500 units/hr  Monitor daily CBC, LDH, fibrinogen, s/sx of bleeding  Antonietta Jewel, PharmD, [place] Pharmacist  Phone: [phone] 02/14/2020 7:55 AM  Please check AMION for all [place] phone numbers After 10:00 PM, call [place] [phone]

## 2020-02-14 NOTE — Progress Notes (Signed)
[facility] Progress Note    Assessment/ Plan:   1.  Anuric AKI due to shock, sepsis causing acute tubular necrosis:  CRRT on 8/22- 02/12/20. IHD 02/13/20, next planned for 02/15/20   2.Shock due to sepsis, Covid infection, blood cultures positive for Enterococcus bacteremia:s/p Zosyn (ended 9/7), cultures repeated 9/7.  TTE 9/7 no AI, done for concern of wide pulse pressure  3.Acute hypoxic and hypercapnic respiratory failure, ARDS due to COVID-19 pneumonia: Currently on VV ECMO for refractory hypoxemia.  3.Hypernatremia, hypervolemia: relative free water deficit. Improved with CRRT.  4.Acute toxic metabolic encephalopathy:Multifactorial, improving  5.Anemia, thrombocytopenia: With hemolysis likely and acute illness contributing. Transfusions per primary team.Seen by hematologist.  6.Intracranial hemorrhage: With left-sided weakness  7.  Diabetes per primary  8.  Hypophosphatemia improved  Subjective:    Tolerated IHD yesterday with 1.5L off.  Weights are down.     Objective:   BP (!) 118/51 (BP Location: Left Arm)   Pulse (!) 101   Temp 98.9 F (37.2 C) (Oral)   Resp (!) 26   Ht _0  (1.626 m)   Wt 119.6 kg   SpO2 (!) 88%   BMI 45.26 kg/m   Intake/Output Summary (Last 24 hours) at 02/14/2020 0949 Last data filed at 02/14/2020 0800 Gross per 24 hour  Intake 1964.91 ml  Output 1775 ml  Net 189.91 ml   Weight change: -1.5 kg  Physical Exam: Gen: critically ill, lying in bed NECK: R IJ ECMO access CVS: RRR Resp: mech bilaterally Abd: obese Ext: 1+ edema bilaterally ACCESS: L IJ nontunneled HD catheter  Imaging: DG CHEST PORT 1 VIEW  Result Date: 02/14/2020 CLINICAL DATA:  ECMO EXAM: PORTABLE CHEST 1 VIEW COMPARISON:  Yesterday FINDINGS: Endotracheal tube tip is at the clavicular heads. Bilateral central line in unchanged position. ECMO catheter in stable position. The enteric tube at least reaches the mid stomach. Airless lungs and  obscured heart size. No visible air leak. IMPRESSION: 1. Stable hardware positioning. 2. Airless lungs. Electronically Signed   By: Monte Fantasia M.D.   On: 02/14/2020 07:41   DG CHEST PORT 1 VIEW  Result Date: 02/13/2020 CLINICAL DATA:  Respiratory failure.  Recent COVID. EXAM: PORTABLE CHEST 1 VIEW COMPARISON:  02/12/2020 FINDINGS: Tracheostomy, enteric tube, and central lines are unchanged in position. Diffuse airspace consolidation in both lungs without change. Heart size is obscured. IMPRESSION: Diffuse airspace consolidation in both lungs without change. Electronically Signed   By: Lucienne Capers M.D.   On: 02/13/2020 06:29   ECHOCARDIOGRAM LIMITED  Result Date: 02/13/2020    ECHOCARDIOGRAM LIMITED REPORT   Patient Name:   Alisha Stephenson Date of Exam: 02/13/2020 Medical Rec #:  [id number]       Height:       64.0 in Accession #:    [id number]      Weight:       267.9 lb Date of Birth:  [birth year]        BSA:          2.215 m Patient Age:    54 years        BP:           109/48 mmHg Patient Gender: F               HR:           91 bpm. Exam Location:  Inpatient Procedure: Limited Echo, Limited Color Doppler and Cardiac Doppler Indications:    Aortic Valve Disorder 424.1 /  I35.9  History:        Patient has prior history of Echocardiogram examinations, most                 recent 01/16/2020. Risk Factors:Diabetes and Former Smoker. ECMO.  Sonographer:    Vickie Epley RDCS Referring Phys: [id number] DANIEL R BENSIMHON  Sonographer Comments: Technically difficult study due to poor echo windows. IMPRESSIONS  1. Left ventricular ejection fraction, by estimation, is 65 to 70%. The left ventricle has normal function. The left ventricle has no regional wall motion abnormalities. Left ventricular diastolic function could not be evaluated.  2. Right ventricular systolic function is normal. The right ventricular size is normal. Tricuspid regurgitation signal is inadequate for assessing PA pressure.  3. The mitral valve is  normal in structure. No evidence of mitral valve regurgitation. No evidence of mitral stenosis.  4. The aortic valve is normal in structure. Aortic valve regurgitation is not visualized. No aortic stenosis is present.  5. The inferior vena cava is normal in size with greater than 50% respiratory variability, suggesting right atrial pressure of 3 mmHg. FINDINGS  Left Ventricle: Left ventricular ejection fraction, by estimation, is 65 to 70%. The left ventricle has normal function. The left ventricle has no regional wall motion abnormalities. The left ventricular internal cavity size was normal in size. There is  no left ventricular hypertrophy. Right Ventricle: The right ventricular size is normal. Right vetricular wall thickness was not assessed. Right ventricular systolic function is normal. Tricuspid regurgitation signal is inadequate for assessing PA pressure. Left Atrium: Left atrial size was normal in size. Right Atrium: Right atrial size was not well visualized. Pericardium: There is no evidence of pericardial effusion. Mitral Valve: The mitral valve is normal in structure. Normal mobility of the mitral valve leaflets. No evidence of mitral valve stenosis. Tricuspid Valve: The tricuspid valve is normal in structure. Tricuspid valve regurgitation is not demonstrated. No evidence of tricuspid stenosis. Aortic Valve: The aortic valve is normal in structure. Aortic valve regurgitation is not visualized. No aortic stenosis is present. Pulmonic Valve: The pulmonic valve was not assessed. No evidence of pulmonic stenosis. Aorta: The aortic root is normal in size and structure. Venous: The inferior vena cava is normal in size with greater than 50% respiratory variability, suggesting right atrial pressure of 3 mmHg. IAS/Shunts: No atrial level shunt detected by color flow Doppler. LEFT VENTRICLE PLAX 2D LVIDd:         5.00 cm LVIDs:         3.10 cm LV PW:         0.90 cm LV IVS:        0.90 cm LVOT diam:     2.10 cm LV  SV:         68 LV SV Index:   31 LVOT Area:     3.46 cm  LEFT ATRIUM         Index LA diam:    2.80 cm 1.26 cm/m  AORTIC VALVE LVOT Vmax:   118.00 cm/s LVOT Vmean:  79.800 cm/s LVOT VTI:    0.196 m MITRAL VALVE MV Area (PHT): 4.06 cm    SHUNTS MV Decel Time: 187 msec    Systemic VTI:  0.20 m MV E velocity: 82.70 cm/s  Systemic Diam: 2.10 cm MV A velocity: 56.10 cm/s MV E/A ratio:  1.47 Fransico Him MD Electronically signed by Fransico Him MD Signature Date/Time: 02/13/2020/3:30:58 PM    Final     Labs:  BMET Recent Labs  Lab 02/09/20 1604 02/09/20 1611 02/10/20 0322 02/10/20 0353 02/11/20 0322 02/11/20 0333 02/11/20 1608 02/11/20 2016 02/12/20 0405 02/12/20 0516 02/12/20 1130 02/12/20 1130 02/12/20 1625 02/12/20 1831 02/13/20 0314 02/13/20 0315 02/13/20 1611 02/13/20 1723 02/13/20 1841 02/13/20 1950 02/13/20 2228 02/14/20 0249 02/14/20 0406  NA 136   < > 135   < > 138   < > 137   < > 137   < > 136   < > 137  137   < > 137   < > 138 138 139 139 138 136 138  K 4.3   < > 4.2   < > 4.0   < > 4.1   < > 4.0   < > 4.0   < > 3.9  4.2   < > 3.8   < > 4.0 3.9 3.9 4.1 4.3 3.8 4.0  CL 102   < > 102   < > 101   < > 102  --  102  --  101  --  101  --  100  --  101  --   --   --   --  100  --   CO2 22   < > 22   < > 23   < > 23  --  22  --  22  --  24  --  23  --  25  --   --   --   --  22  --   GLUCOSE 162*   < > 138*   < > 172*   < > 127*  --  126*  --  145*  --  152*  --  194*  --  176*  --   --   --   --  178*  --   BUN 30*   < > 25*   < > 28*   < > 20  --  14  --  16  --  15  --  28*  --  23*  --   --   --   --  31*  --   CREATININE 1.02*   < > 1.02*   < > 1.06*   < > 1.00  --  0.91  --  0.96  --  0.92  --  1.32*  --  1.30*  --   --   --   --  1.77*  --   CALCIUM 8.4*   < > 8.5*   < > 8.5*   < > 8.5*  --  8.5*  --  8.4*  --  8.3*  --  8.6*  --  8.2*  --   --   --   --  8.5*  --   PHOS 4.4  --  2.5  --  2.7  --   --   --  2.5  --  3.6  --   --   --  3.7  --   --   --   --   --   --  3.6   --    < > = values in this interval not displayed.   CBC Recent Labs  Lab 02/12/20 1625 02/12/20 1831 02/13/20 0314 02/13/20 0315 02/13/20 1611 02/13/20 1723 02/13/20 1950 02/13/20 2228 02/14/20 0249 02/14/20 0406  WBC 16.2*  --  13.8*  --  13.6*  --   --   --  12.5*  --   HGB 8.2*  9.2*   < > 7.5*   < > 8.7*   < > 9.2* 9.2* 8.5* 9.2*  HCT 28.4*  27.0*   < > 25.6*   < > 28.5*   < > 27.0* 27.0* 28.2* 27.0*  MCV 112.3*  --  111.8*  --  110.5*  --   --   --  109.3*  --   PLT 108*  --  102*  --  94*  --   --   --  104*  --    < > = values in this interval not displayed.    Medications:    . amiodarone  200 mg Per Tube Daily  . vitamin C  500 mg Per Tube Daily  . [START ON 02/15/2020] B-complex with vitamin C  1 tablet Per Tube Daily  . chlorhexidine gluconate (MEDLINE KIT)  15 mL Mouth Rinse BID  . Chlorhexidine Gluconate Cloth  6 each Topical Q0600  . clonazePAM  1 mg Per Tube Q6H  . docusate  100 mg Per Tube BID  . feeding supplement (PROSource TF)  45 mL Per Tube TID  . fentaNYL (SUBLIMAZE) injection  50 mcg Intravenous Once  . linagliptin  5 mg Per Tube Daily  . mouth rinse  15 mL Mouth Rinse 10 times per day  . melatonin  9 mg Per Tube QHS  . midodrine  10 mg Per Tube TID  . oxyCODONE  10 mg Per Tube Q6H  . pantoprazole (PROTONIX) IV  40 mg Intravenous BID  . polyethylene glycol  17 g Per Tube Daily  . QUEtiapine  50 mg Per Tube QHS  . sennosides  5 mL Per Tube BID  . zinc sulfate  220 mg Per Tube Daily      Madelon Lips MD 02/14/2020, 9:49 AM

## 2020-02-14 NOTE — Progress Notes (Signed)
   02/14/20 1100  Clinical Encounter Type  Visited With Patient and family together  Visit Type Follow-up  Consult/Referral To Chaplain  Spiritual Encounters  Spiritual Needs Emotional  Stress Factors  Family Stress Factors Exhausted;Major life changes  Chaplain rounded and checked on pt. And her husband. Offered emotional support to the husband. Offered active listening to husband and spiritual care. Husband is very supportive of his wife. Chaplain will follow up as needed.

## 2020-02-14 NOTE — Progress Notes (Addendum)
Occupational Therapy Treatment Patient Details Name: Alisha Stephenson MRN: [id number] DOB: Apr 19, 1966 Today's Date: 02/14/2020    History of present illness 54 yo admitted 8/2 with hypoxemia due to Covid 19 PNA. 8/3 intubated. 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 CRRT initiated 8/22. Reintubated 8/24 with ECMO circuit changed, 8/30 trach. PMHx: DM, diverticulitis, Asthma   OT comments  Pt tolerating tilt via Kreg bed during OT session, tilting to 28* for approx 20 min. SpO2 briefly down to 86% with initial transition to flat/supine prior to stand but once in tilt SpO2 maintaining >/=93% (trach/vent PEEP 10, FiO2 40%), HR 89 and BP 159/67. Pt with no command follow to engage moving UEs but intermittently nodding head yes/no - when doing so appears to be appropriate. Spouse present and supportive throughout. Repositioned towards L side end of session for continued pressure relief. Will continue per POC.    Follow Up Recommendations  LTACH;Supervision/Assistance - 24 hour    Equipment Recommendations  Other (comment) (defer)          Precautions / Restrictions Precautions Precautions: Other (comment) Precaution Comments: ECMO with RIJ access anchors at right shoulder and chest, right necrotic thumb and index finger, bruised/necrotic left hand, trach, vent, flexiseal Other Brace: PRAFO Restrictions Weight Bearing Restrictions: No       Mobility Bed Mobility               General bed mobility comments: use of Kreg bed tilt feature today  Transfers Overall transfer level: Needs assistance               General transfer comment: til to 78* for 20 min today, placed towel roll behind pt's knees to prevent hyperextension     Balance                                           ADL either performed or assessed with clinical judgement   ADL Overall ADL's : Needs assistance/impaired                                        General ADL Comments: totalA     Vision       Perception     Praxis      Cognition Arousal/Alertness: Awake/alert Behavior During Therapy: Flat affect Overall Cognitive Status: Difficult to assess                                 General Comments: pt nodding head yes/no - intermittently but when she does engage it appears appropriate. when attempting to get pt to squeeze therapist's hands pt shaking her head "no" - aware of request asked but opting not to follow through        Exercises Other Exercises Other Exercises: PROM to bil UE Other Exercises: retrograde massage to bil hands for edema management    Shoulder Instructions       General Comments      Pertinent Vitals/ Pain       Pain Assessment: Faces Faces Pain Scale: Hurts little more Pain Location: bil LEs with repositioning, donning/doffing shoes  Home Living  Prior Functioning/Environment              Frequency  Min 2X/week        Progress Toward Goals  OT Goals(current goals can now be found in the care plan section)  Progress towards OT goals: Progressing toward goals  Acute Rehab OT Goals Patient Stated Goal: per husband, to see her dog [name] OT Goal Formulation: Patient unable to participate in goal setting Time For Goal Achievement: 02/23/20 Potential to Achieve Goals: Fair ADL Goals Pt Will Perform Grooming: with max assist;bed level;sitting Pt/caregiver will Perform Home Exercise Program: Increased ROM;Increased strength;Both right and left upper extremity;With minimal assist;With written HEP provided Additional ADL Goal #1: Pt will follow 1 step commands with 75% accuracy. Additional ADL Goal #2: Pt will tolerate tilt via Kreg bed x 10 min to 30 degrees with VSS.  Plan Discharge plan remains appropriate    Co-evaluation                 AM-PAC OT "6 Clicks" Daily Activity      Outcome Measure   Help from another person eating meals?: Total Help from another person taking care of personal grooming?: Total Help from another person toileting, which includes using toliet, bedpan, or urinal?: Total Help from another person bathing (including washing, rinsing, drying)?: Total Help from another person to put on and taking off regular upper body clothing?: Total Help from another person to put on and taking off regular lower body clothing?: Total 6 Click Score: 6    End of Session Equipment Utilized During Treatment: Oxygen  OT Visit Diagnosis: Muscle weakness (generalized) (M62.81);Other symptoms and signs involving cognitive function   Activity Tolerance Patient tolerated treatment well   Patient Left in bed;with call bell/phone within reach;with family/visitor present;with nursing/sitter in room   Nurse Communication Mobility status        Time: 1605-1700 OT Time Calculation (min): 55 min  Charges: OT General Charges $OT Visit: 1 Visit OT Treatments $Therapeutic Activity: 53-67 mins  Lou Cal, OT Acute Rehabilitation Services Pager [phone] Office Center 02/14/2020, 5:41 PM

## 2020-02-14 NOTE — Progress Notes (Signed)
Patient ID: Alisha Stephenson, female   DOB: 10-24-1965, 54 y.o.   MRN: [id number]    Advanced Heart Failure Rounding Note   Subjective:    - 8/2 COVID + test - 8/9 Cannulated for VV ECMO - 8/13 with several areas of intracranial hemorrhage. Bival stopped.  - 8/14 CT no change in [place]. Increased edema - 8/16 Extubated - 8/16 Head CT stable bleed - 8/22 CVVHD started - 8/24 reintubated - 8/24 circuit changed - 8/30 trach - 9/7  Switched to I HD  Remains awake on vent. Wasn't following commands earlier this am but now responding to commands. Still very weak   Tolerated iHD yesterday but required NE.  Echo EF  65-70% no AI   ECMO   Flow 4.8 L RPM 4000 Sweep8.5  Labs:  7.36/43/54/85% Fio2 40% TV 120 Hgb8.5 PLT 104k LDH 489 -> 401 -> 376 Lactic acid1.4   Objective:   Weight Range:  Vital Signs:   Temp:  [98 F (36.7 C)-99.5 F (37.5 C)] 99.5 F (37.5 C) (09/08 0400) Pulse Rate:  [83-101] 98 (09/08 0700) Resp:  [15-22] 15 (09/08 0340) BP: (109-139)/(40-60) 119/51 (09/08 0700) SpO2:  [86 %-96 %] 87 % (09/08 0700) Arterial Line BP: (110-249)/(33-80) 124/80 (09/08 0700) FiO2 (%):  [40 %] 40 % (09/08 0340) Weight:  [119.6 kg-121.5 kg] 119.6 kg (09/08 0500) Last BM Date: 02/12/20  Weight change: Filed Weights   02/13/20 0700 02/13/20 1015 02/14/20 0500  Weight: 123 kg 121.5 kg 119.6 kg    Intake/Output:   Intake/Output Summary (Last 24 hours) at 02/14/2020 0800 Last data filed at 02/14/2020 0700 Gross per 24 hour  Intake 2104.88 ml  Output 1775 ml  Net 329.88 ml     Physical Exam: General:  Awake on vent. Will follow commands unable to move much HEENT: normal Neck: supple. RIJ ECMO cannula  + LSC trialysis Carotids 2+ bilat; no bruits. No lymphadenopathy or thryomegaly appreciated. Cor: PMI nondisplaced. Regular rate & rhythm. No rubs, gallops or murmurs. Lungs: coarse Abdomen: obese soft, nontender, nondistended. No hepatosplenomegaly. No bruits or  masses. Good bowel sounds. Extremities: no cyanosis, clubbing, rash, 1+ edema Neuro: awake. Will stick tongue out and close eyes. Minimal strength in upper and lower extremities  Telemetry: Sinus 90s Personally reviewed   Labs: Basic Metabolic Panel: Recent Labs  Lab 02/09/20 1604 02/09/20 1611 02/10/20 0322 02/10/20 0353 02/11/20 0322 02/11/20 0333 02/12/20 0405 02/12/20 0516 02/12/20 1130 02/12/20 1130 02/12/20 1625 02/12/20 1831 02/13/20 0314 02/13/20 0315 02/13/20 1611 02/13/20 1723 02/13/20 1841 02/13/20 1950 02/13/20 2228 02/14/20 0249 02/14/20 0406  NA 136   < > 135   < > 138   < > 137   < > 136   < > 137  137   < > 137   < > 138   < > 139 139 138 136 138  K 4.3   < > 4.2   < > 4.0   < > 4.0   < > 4.0   < > 3.9  4.2   < > 3.8   < > 4.0   < > 3.9 4.1 4.3 3.8 4.0  CL 102   < > 102   < > 101   < > 102   < > 101  --  101  --  100  --  101  --   --   --   --  100  --   CO2 22   < >  22   < > 23   < > 22   < > 22  --  24  --  23  --  25  --   --   --   --  22  --   GLUCOSE 162*   < > 138*   < > 172*   < > 126*   < > 145*  --  152*  --  194*  --  176*  --   --   --   --  178*  --   BUN 30*   < > 25*   < > 28*   < > 14   < > 16  --  15  --  28*  --  23*  --   --   --   --  31*  --   CREATININE 1.02*   < > 1.02*   < > 1.06*   < > 0.91   < > 0.96  --  0.92  --  1.32*  --  1.30*  --   --   --   --  1.77*  --   CALCIUM 8.4*   < > 8.5*   < > 8.5*   < > 8.5*   < > 8.4*   < > 8.3*   < > 8.6*  --  8.2*  --   --   --   --  8.5*  --   MG 2.7*  --  2.7*  --  2.7*  --  2.7*  --   --   --   --   --  2.9*  --   --   --   --   --   --   --   --   PHOS 4.4   < > 2.5   < > 2.7  --  2.5  --  3.6  --   --   --  3.7  --   --   --   --   --   --  3.6  --    < > = values in this interval not displayed.    Liver Function Tests: Recent Labs  Lab 02/12/20 1130  ALBUMIN 2.5*   No results for input(s): LIPASE, AMYLASE in the last 168 hours. No results for input(s): AMMONIA in the last 168  hours.  CBC: Recent Labs  Lab 02/12/20 1122 02/12/20 1122 02/12/20 1625 02/12/20 1831 02/13/20 0314 02/13/20 0315 02/13/20 1611 02/13/20 1723 02/13/20 1841 02/13/20 1950 02/13/20 2228 02/14/20 0249 02/14/20 0406  WBC 15.7*  --  16.2*  --  13.8*  --  13.6*  --   --   --   --  12.5*  --   HGB 8.3*   < > 8.2*  9.2*   < > 7.5*   < > 8.7*   < > 9.5* 9.2* 9.2* 8.5* 9.2*  HCT 28.2*   < > 28.4*  27.0*   < > 25.6*   < > 28.5*   < > 28.0* 27.0* 27.0* 28.2* 27.0*  MCV 111.9*  --  112.3*  --  111.8*  --  110.5*  --   --   --   --  109.3*  --   PLT 106*  --  108*  --  102*  --  94*  --   --   --   --  104*  --    < > = values in this interval  not displayed.    Cardiac Enzymes: No results for input(s): CKTOTAL, CKMB, CKMBINDEX, TROPONINI in the last 168 hours.  BNP: BNP (last 3 results) No results for input(s): BNP in the last 8760 hours.  ProBNP (last 3 results) No results for input(s): PROBNP in the last 8760 hours.    Other results:  Imaging: DG Chest 1 View  Result Date: 02/12/2020 CLINICAL DATA:  ARDS on ECMO EXAM: CHEST  1 VIEW COMPARISON:  Yesterday FINDINGS: ECMO catheter in stable position. The tracheostomy tube, bilateral central lines, and enteric tube are unremarkable. The enteric tube is now a gastric suction tube instead of a feeding tube. Unchanged extensive pulmonary opacity. No visible air leak. Stable heart size. IMPRESSION: Unremarkable hardware positioning and stable confluent airspace disease. Electronically Signed   By: Monte Fantasia M.D.   On: 02/12/2020 09:49   DG CHEST PORT 1 VIEW  Result Date: 02/14/2020 CLINICAL DATA:  ECMO EXAM: PORTABLE CHEST 1 VIEW COMPARISON:  Yesterday FINDINGS: Endotracheal tube tip is at the clavicular heads. Bilateral central line in unchanged position. ECMO catheter in stable position. The enteric tube at least reaches the mid stomach. Airless lungs and obscured heart size. No visible air leak. IMPRESSION: 1. Stable hardware  positioning. 2. Airless lungs. Electronically Signed   By: Monte Fantasia M.D.   On: 02/14/2020 07:41   DG CHEST PORT 1 VIEW  Result Date: 02/13/2020 CLINICAL DATA:  Respiratory failure.  Recent COVID. EXAM: PORTABLE CHEST 1 VIEW COMPARISON:  02/12/2020 FINDINGS: Tracheostomy, enteric tube, and central lines are unchanged in position. Diffuse airspace consolidation in both lungs without change. Heart size is obscured. IMPRESSION: Diffuse airspace consolidation in both lungs without change. Electronically Signed   By: Lucienne Capers M.D.   On: 02/13/2020 06:29   ECHOCARDIOGRAM LIMITED  Result Date: 02/13/2020    ECHOCARDIOGRAM LIMITED REPORT   Patient Name:   DESTYN PARFITT Date of Exam: 02/13/2020 Medical Rec #:  [id number]       Height:       64.0 in Accession #:    [id number]      Weight:       267.9 lb Date of Birth:  [birth year]        BSA:          2.215 m Patient Age:    47 years        BP:           109/48 mmHg Patient Gender: F               HR:           91 bpm. Exam Location:  Inpatient Procedure: Limited Echo, Limited Color Doppler and Cardiac Doppler Indications:    Aortic Valve Disorder 424.1 / I35.9  History:        Patient has prior history of Echocardiogram examinations, most                 recent 01/16/2020. Risk Factors:Diabetes and Former Smoker. ECMO.  Sonographer:    Vickie Epley RDCS Referring Phys: [id number] Parris Signer R Shauntee Karp  Sonographer Comments: Technically difficult study due to poor echo windows. IMPRESSIONS  1. Left ventricular ejection fraction, by estimation, is 65 to 70%. The left ventricle has normal function. The left ventricle has no regional wall motion abnormalities. Left ventricular diastolic function could not be evaluated.  2. Right ventricular systolic function is normal. The right ventricular size is normal. Tricuspid regurgitation signal is inadequate for assessing PA pressure.  3. The mitral valve is normal in structure. No evidence of mitral valve regurgitation. No  evidence of mitral stenosis.  4. The aortic valve is normal in structure. Aortic valve regurgitation is not visualized. No aortic stenosis is present.  5. The inferior vena cava is normal in size with greater than 50% respiratory variability, suggesting right atrial pressure of 3 mmHg. FINDINGS  Left Ventricle: Left ventricular ejection fraction, by estimation, is 65 to 70%. The left ventricle has normal function. The left ventricle has no regional wall motion abnormalities. The left ventricular internal cavity size was normal in size. There is  no left ventricular hypertrophy. Right Ventricle: The right ventricular size is normal. Right vetricular wall thickness was not assessed. Right ventricular systolic function is normal. Tricuspid regurgitation signal is inadequate for assessing PA pressure. Left Atrium: Left atrial size was normal in size. Right Atrium: Right atrial size was not well visualized. Pericardium: There is no evidence of pericardial effusion. Mitral Valve: The mitral valve is normal in structure. Normal mobility of the mitral valve leaflets. No evidence of mitral valve stenosis. Tricuspid Valve: The tricuspid valve is normal in structure. Tricuspid valve regurgitation is not demonstrated. No evidence of tricuspid stenosis. Aortic Valve: The aortic valve is normal in structure. Aortic valve regurgitation is not visualized. No aortic stenosis is present. Pulmonic Valve: The pulmonic valve was not assessed. No evidence of pulmonic stenosis. Aorta: The aortic root is normal in size and structure. Venous: The inferior vena cava is normal in size with greater than 50% respiratory variability, suggesting right atrial pressure of 3 mmHg. IAS/Shunts: No atrial level shunt detected by color flow Doppler. LEFT VENTRICLE PLAX 2D LVIDd:         5.00 cm LVIDs:         3.10 cm LV PW:         0.90 cm LV IVS:        0.90 cm LVOT diam:     2.10 cm LV SV:         68 LV SV Index:   31 LVOT Area:     3.46 cm  LEFT  ATRIUM         Index LA diam:    2.80 cm 1.26 cm/m  AORTIC VALVE LVOT Vmax:   118.00 cm/s LVOT Vmean:  79.800 cm/s LVOT VTI:    0.196 m MITRAL VALVE MV Area (PHT): 4.06 cm    SHUNTS MV Decel Time: 187 msec    Systemic VTI:  0.20 m MV E velocity: 82.70 cm/s  Systemic Diam: 2.10 cm MV A velocity: 56.10 cm/s MV E/A ratio:  1.47 Fransico Him MD Electronically signed by Fransico Him MD Signature Date/Time: 02/13/2020/3:30:58 PM    Final      Medications:     Scheduled Medications: . amiodarone  200 mg Per Tube Daily  . vitamin C  500 mg Per Tube Daily  . B-complex with vitamin C  1 tablet Oral Daily  . chlorhexidine gluconate (MEDLINE KIT)  15 mL Mouth Rinse BID  . Chlorhexidine Gluconate Cloth  6 each Topical Q0600  . clonazePAM  1 mg Per Tube Q6H  . docusate  100 mg Per Tube BID  . feeding supplement (PROSource TF)  45 mL Per Tube TID  . fentaNYL (SUBLIMAZE) injection  50 mcg Intravenous Once  . linagliptin  5 mg Per Tube Daily  . mouth rinse  15 mL Mouth Rinse 10 times per day  . melatonin  9 mg Per  Tube QHS  . midodrine  10 mg Per Tube TID  . oxyCODONE  10 mg Per Tube Q6H  . pantoprazole (PROTONIX) IV  40 mg Intravenous BID  . polyethylene glycol  17 g Per Tube Daily  . QUEtiapine  50 mg Per Tube QHS  . sennosides  5 mL Per Tube BID  . zinc sulfate  220 mg Per Tube Daily    Infusions: . sodium chloride 500 mL (02/11/20 2215)  . sodium chloride 10 mL/hr at 02/14/20 0700  . sodium chloride    . sodium chloride    . albumin human 12.5 g (02/12/20 2231)  . albumin human Stopped (02/06/20 2000)  . feeding supplement (PIVOT 1.5 CAL) 20 mL/hr at 02/13/20 1300  . heparin 500 Units/hr (02/14/20 0700)  . insulin 2.8 mL/hr at 02/14/20 0700  . norepinephrine (LEVOPHED) Adult infusion 8 mcg/min (02/14/20 0700)  . phenylephrine (NEO-SYNEPHRINE) Adult infusion Stopped (02/13/20 0155)    PRN Medications: sodium chloride, sodium chloride, sodium chloride, sodium chloride, acetaminophen  (TYLENOL) oral liquid 160 mg/5 mL, albumin human, albuterol, alteplase, alum & mag hydroxide-simeth, dextrose, fentaNYL (SUBLIMAZE) injection, guaiFENesin-dextromethorphan, heparin, hydrALAZINE, HYDROmorphone (DILAUDID) injection, hydrOXYzine, lidocaine (PF), lidocaine-prilocaine, ondansetron **OR** ondansetron (ZOFRAN) IV, pentafluoroprop-tetrafluoroeth   Assessment/Plan:   1. Acute hypoxic/hypercapneic respiratory failure in setting of severe COVID PNA/ARDS -> VV ECMO - admit 8/2 - intubation 8/3 - has received actmera (compelted 8/2), remdesivir (completed 8/6) and steroids - Cannulated for VV ECMO on 8/9 - Extubated 8/16. Reintubated 8/24 - s/p trach 8/30. - Circuit changed 8/24 due to concern for hemolysis and worsening oxygenation. - CVVHD started 8/22 for volume removal and uremia. Remains anuric.Volume status improved. Keep mildly negative. Started iHD on 9/7 Volume status looks good today. Will need to be on a regular schedule. Will discuss access with Renal. Hopefully kidneys still have a chance to recover  - Remains awake on vent  Has grown increasingly weak. Suspect severe critical illness myopathy. PT seeing. She will have a long road. No change today.  - Remains on low-dose heparin. Numbers look good. Discussed dosing with PharmD personally. - CXR improved. TVs improving on vent. ABG ok. Continue to wean sweep - Continue melatonin and seroquel for sleep at night  - Back on NE at 7 in setting of wide pulse pressure. Concern for aortic valve insufficiency with possible previous vegetation. Echo 9/7 no AI  2. Enterococcus sepsis - Finished high-dose zosyn on 9/7 per ID - Eraxis added on 8/26 with yeast in BAL 8/24 (ended 9/2)  - Cultures repeated last night with increasing pressor demands - F/u cx 9/7 NGTD  3. Intracranial hemorrhage - ? Septic emboli - repeat head CT on 8/14 with stable bleeds but increased edema - neurology has seen. Suspect significant long-term injury  sustained. Will follow commands. Appears to have dense LUE weakness and possibly LLE - repeat head CT stable 8/16 - tolerating low-dose heparin  4. Thrombocytopenia - PLTs up > 100k - Continue to follow  5. Morbid obesity - Body mass index is Body mass index is 45.26 kg/m.  6. Poorly controlled DM2 - HgBA1c 10.7 - CBGs remain elevated - On high-dose IV insulin and sugars still high  7. PAF - intermittent episodes. Last 8/26 - Now on po. Quiescent  8. AKI/azotemia - CVVHD started on 8/22 - Down 70 pounds.  - transitioned to iHD on 9/7\ -  Remains anuric.Volume status improved. Keep mildly negative. Started iHD on 9/7 Volume status looks good today. Will need  to be on a regular schedule. Will discuss access with Renal. Hopefully kidneys still have a chance to recover   9. Emesis - Cor-trak in stomach. Will d/c - On trickle TFs - Continue prokinetics - Likely needs PEG. IR consulted  CRITICAL CARE Performed by: Glori Bickers  Total critical care time: 35 minutes  Critical care time was exclusive of separately billable procedures and treating other patients.  Critical care was necessary to treat or prevent imminent or life-threatening deterioration.  Critical care was time spent personally by me (independent of midlevel providers or residents) on the following activities: development of treatment plan with patient and/or surrogate as well as nursing, discussions with consultants, evaluation of patient's response to treatment, examination of patient, obtaining history from patient or surrogate, ordering and performing treatments and interventions, ordering and review of laboratory studies, ordering and review of radiographic studies, pulse oximetry and re-evaluation of patient's condition.   Length of Stay: [place]  MD 02/14/2020, 8:00 AM  Advanced Heart Failure Team Pager [phone] (M-F; [place])  Please contact [facility] for night-coverage after  hours (4p -7a ) and weekends on [email or web address]

## 2020-02-14 NOTE — Progress Notes (Addendum)
NAME:  Alisha Stephenson, MRN:  [id number], DOB:  07-07-1965, LOS: 50 ADMISSION DATE:  02/02/2020, CONSULTATION DATE:  8/3 REFERRING MD:  Alfredia Ferguson, CHIEF COMPLAINT:  Dyspnea   Brief History   54 y/o female admitted on 8/2 with severe acute respiratory failure with hypoxemia due to COVID 19 pneumonia.  She developed symptoms 1 week prior to admission.  Past Medical History  DM2 Diverticulitis Gallstones Ovarian cyst NAFLD Asthma  Significant Hospital Events   8/2 admit 8/3 ICU transfer, intubated 8/4 prone, paralyze 8/9 significant desaturations today 8/9 VV ECMO cannulation 8/13 ICH 8/30 trach  Consults:  PCCM ECMO team  Procedures:  8/3 ETT > 8/30 8/30 8-0 shiley trach 8/3 PICC >  8/9 LIJ MML 8/9 RIJ Crescent 82F   Significant Diagnostic Tests:  7/31 CT head > NAICP 7/31 MRI/MRA brain > no acute changes, possibly small aneurysm ACOM 8/13 CT head> multiple areas of ICH 9/5 CXR > significant improvement in aeration bilaterally.   Micro Data:  8/2 blood > NG 8/2 SARS COV 2 > positive 8/4 resp > negative 8/4 urine >  8/12 blood > E. faecalis (pan-sensitive) 8/12 resp: staph aureus> MSSA 8/25 blood>> NG 8/24 BAL> yeast  Antimicrobials:  See fever tab for past abx Current Eraxis 8/26>>9/2 Zosyn 8/23>>9/7  Interim history/subjective:  No events.   Less responsive today. Ongoing oozing from inferior edge of tracheostomy  Objective   Blood pressure (!) 119/51, pulse 98, temperature 99.5 F (37.5 C), temperature source Axillary, resp. rate 15, height _0  (1.626 m), weight 119.6 kg, SpO2 (!) 87 %.    Vent Mode: PCV FiO2 (%):  [40 %] 40 % Set Rate:  [15 bmp] 15 bmp PEEP:  [10 cmH20] 10 cmH20 Plateau Pressure:  [20 cmH20-23 cmH20] 20 cmH20   Intake/Output Summary (Last 24 hours) at 02/14/2020 2426 Last data filed at 02/14/2020 0700 Gross per 24 hour  Intake 2184.45 ml  Output 1775 ml  Net 409.45 ml   Filed Weights   02/13/20 0700 02/13/20 1015 02/14/20  0500  Weight: 123 kg 121.5 kg 119.6 kg    Examination:  GEN: no acute distress watching TV HEENT: trach in place, bleeding along inferior edge, mild CV: RRR, ext warm PULM: shallow inspiratory efforts, nonlabored GI: soft, hypoactive BS EXT: trace anasarca NEURO: she has eyes open, nods her head yes to all questions and commands, not moving ext to command, pupils equal, normal tone x 4 ext; update: she followed commands for Dr. Haroldine Laws PSYCH: RASS 0 SKIN: Pale, finger ischemic stable  ABG paO2 54 BMP okay CBC stable No new micro data Fibrinogen 547>>571>>481 LDH 489>>401>>376 PTT 38 Net +0.4L  Resolved Hospital Problem list   MSSA pneumonia Fungal pneumonia- Candida dublinensis Enterococcal bacteremia-recent blood cultures cleared Concern for possible embolic phenomenon- fingers and possibly CVAs were embolic  Assessment & Plan:    Critically ill due to Acute hypoxic and AoC hypercapneic respiratory failure; likely underly OHS requiring VV ECMO support and mechanical ventilation.  ARDS due to COVID 19 pneumonia Tracheostomy in place -Continue ultra lung protective ventilation.  -Continue VV ECMO support - wean sweep again today, check CXR -VAP prevention protocol -Not currently requiring sedation for mechanical ventilation. -Routine trach care -Antimicrobials per ID - IR Consult for PEG-J  Elevated LDH, hypo-fibrinogenemia and thrombocytopenia--improving since circuit change. -Continue to monitor  ICH, multifocal. L-sided weakness.  Critical illness myopathy -Continue PT, OT  Acute metabolic encephalopathy, intermittent - IV Sedation requirements minimal; only PRN now -Continue melatonin  and seroquel  Acute renal failure, severe uremia.  Anuric. -Trial of iHD but if we are not able to keep up with I/O will have to go back to CRRT -Renally dose medications -Strict I/os  Shock related to  RV strain - now resolved, patient is now hypertension  - Restart  midodrine to help iHD tolerance - If still hypertensive off midodrine, would initiate betablocker as agent of choice to decrease CO and improve oxygenation.   Hyperglycemia > not controlled at all with Christine insulin despite aggressive upward titration.  Assume poor absorption, potentially due to subcutaneous edema.   -Improved on IV, may want to consider another trial of subcutaneous at some point  Best practice:  Diet: tube feeding Pain/Anxiety/Delirium protocol (if indicated): as above VAP protocol (if indicated): yes DVT prophylaxis: SCDs , heparin fixed dose GI prophylaxis: pantoprazole Glucose control:  insulin infusion  Mobility: bed rest Code Status: limited Family Communication: will update when they come in Disposition: ICU  This patient is critically ill with multiple organ system failure which requires frequent high complexity decision making, assessment, support, evaluation, and titration of therapies. This was completed through the application of advanced monitoring technologies and extensive interpretation of multiple databases. During this encounter critical care time was devoted to patient care services described in this note for 38 minutes.  Candee Furbish, MD 02/14/20 7:12 AM [facility]

## 2020-02-14 NOTE — Procedures (Signed)
Extracorporeal support note   ECLS support day: 24 (cannulated 01/21/2020) Indication: Covid 19 pneumonia/ARDS  Configuration: VV  Drainage cannula: right IJ [place]  Return cannula: same  Pump speed: 4000 Pump flow: 4.8 Pump used: Centrimag  Oxygenator: Quadrox O2 blender: 100% Sweep gas: 8.5  ABG    Component Value Date/Time   PHART 7.358 02/14/2020 0406   PCO2ART 43.1 02/14/2020 0406   PO2ART 54 (L) 02/14/2020 0406   HCO3 24.1 02/14/2020 0406   TCO2 25 02/14/2020 0406   ACIDBASEDEF 1.0 02/14/2020 0406   O2SAT 85.0 02/14/2020 0406    Circuit check: no visible clot Anticoagulant: Continue heparin Anticoagulation targets: aptt 40-60  Changes in support: Wean sweep.   Anticipated goals/duration of support: bridge to recovery. Showing signs of lung recovery.    Alisha Furbish, MD 02/14/20 7:14 AM [facility]

## 2020-02-14 NOTE — Consult Note (Addendum)
WOC Nurse Consult Note: Reason for Consult: Consult requested for anus; performed remotely after review of progress notes in the EMR.  Pt is critically ill with multiple systemic factors which can impair healing. Wound type: Deep tissue pressure injury was first noted today by the bedside nurse to the inner buttocks near the rectum; 2X2cm according to nursing flowsheet.  Topical treatment orders provided for bedside nurses to perform to protect and repel moisture: Apply butt cream near rectum BID and PRN when soiled WOC team will reassess weekly to determine if a change in the plan of care is indicated at that time.  Pressure Injury POA: No Julien Girt MSN, [place], [place], [place], [place]

## 2020-02-14 NOTE — Progress Notes (Addendum)
Nutrition Follow Up  DOCUMENTATION CODES:   Morbid obesity  INTERVENTION:   IR to assess for PEG-J. If unable to place in near future recommend trial of increase in tube feeding via gastric NG or replacement of post pyloric Cortrak.   Continue trickle tube feeding:  -Pivot 1.5 @ 20 ml/hr via NG  -45 ml Prosource QID  Goal rate: 65 ml/hr   At goal TF provides: 2500 kcals, 190 grams protein, 1170 ml free water. Meets 100% needs.   D/C b complex with Vitamin C, start Renal MVI   NUTRITION DIAGNOSIS:   Increased nutrient needs related to acute illness (COVID-19 infection) as evidenced by estimated needs.  Ongoing  GOAL:   Patient will meet greater than or equal to 90% of their needs  Addressed via TF  MONITOR:   Vent status, TF tolerance, Labs, Weight trends  REASON FOR ASSESSMENT:   Ventilator, Consult Enteral/tube feeding initiation and management  ASSESSMENT:   54 y.o. female with medical history of type 2 DM. She presented to the ED at [facility] with SOB diarrhea x1 week, and cough. She was dx with COVID-19 one week prior to presentation to the ED.   8/9- tx from [facility], s/p VV ECMO cannulation 8/13- CT head with multiple areas of [place] 8/16- extubated, gastric Cortrak placed  8/22- start CRRT 8/30- trach 9/3- large vomiting episode, Cortrak advanced 2nd portion duodenum 9/6- stop CRRT, Cortrak coiled in stomach  9/7- first iHD  Pt discussed during ICU rounds and with RN.   More bleeding from trach. Less responsive today. Had first iHD treatment yesterday-1.5L off. Noted new wound (anus). Post pyloric Cortrak showed to be coiled in stomach over the weekend, subsequently pulled. Replaced by gastric NG and trickle feedings resumed. Plan IR assessment for PEG-J. If unable to place in near future recommend trial of increase in tube feeding via gastric NG or replacement of post pyloric Cortrak.    Admission weight: 133.6 kg Current weight: 119.6 kg    Patient requiring ventilator support via trach MV: 2.0 L/min Temp (24hrs), Avg:98.9 F (37.2 C), Min:98 F (36.7 C), Max:99.5 F (37.5 C)   I/O: -15,681 ml since 8/25 UOP: anuric CRRT: 1,475 ml x 24 hrs Stool: 300 ml x 24 hrs   Drips: insulin Medications: 500 mg Vitamin C, b comp with Vit C, colace, tradjenta, miralax, senokot, 220 mg zinc sulfate Labs: CBG 112-198  Diet Order:   Diet Order            Diet NPO time specified  Diet effective midnight                 EDUCATION NEEDS:   No education needs have been identified at this time  Skin:  Skin Assessment: Skin Integrity Issues: Skin Integrity Issues:: Other (Comment) Other: MASD- breast skin tears-face/chest/chin/buttocks  DTI: anus  Last BM:  9/8 via rectal tube  Height:   Ht Readings from Last 1 Encounters:  02/08/20 5\' 4"  (1.626 m)    Weight:   Wt Readings from Last 1 Encounters:  02/14/20 119.6 kg    Ideal Body Weight:  54.5 kg Adj Body Weight: 84.7 kg   BMI:  Body mass index is 45.26 kg/m.  Estimated Nutritional Needs:   Kcal:  3976-7341 kcal  Protein:  170-210 grams  Fluid:  >/= 2 L/day  Mariana Single RD, LDN Clinical Nutrition Pager listed in [place]

## 2020-02-15 ENCOUNTER — Inpatient Hospital Stay (HOSPITAL_COMMUNITY): Payer: PRIVATE HEALTH INSURANCE

## 2020-02-15 LAB — POCT I-STAT 7, (LYTES, BLD GAS, ICA,H+H)
Acid-base deficit: 1 mmol/L (ref 0.0–2.0)
Acid-base deficit: 1 mmol/L (ref 0.0–2.0)
Acid-base deficit: 2 mmol/L (ref 0.0–2.0)
Acid-base deficit: 3 mmol/L — ABNORMAL HIGH (ref 0.0–2.0)
Acid-base deficit: 4 mmol/L — ABNORMAL HIGH (ref 0.0–2.0)
Acid-base deficit: 4 mmol/L — ABNORMAL HIGH (ref 0.0–2.0)
Bicarbonate: 21.2 mmol/L (ref 20.0–28.0)
Bicarbonate: 21.3 mmol/L (ref 20.0–28.0)
Bicarbonate: 23.4 mmol/L (ref 20.0–28.0)
Bicarbonate: 24.2 mmol/L (ref 20.0–28.0)
Bicarbonate: 24.5 mmol/L (ref 20.0–28.0)
Bicarbonate: 24.7 mmol/L (ref 20.0–28.0)
Calcium, Ion: 1.14 mmol/L — ABNORMAL LOW (ref 1.15–1.40)
Calcium, Ion: 1.15 mmol/L (ref 1.15–1.40)
Calcium, Ion: 1.16 mmol/L (ref 1.15–1.40)
Calcium, Ion: 1.16 mmol/L (ref 1.15–1.40)
Calcium, Ion: 1.17 mmol/L (ref 1.15–1.40)
Calcium, Ion: 1.17 mmol/L (ref 1.15–1.40)
HCT: 25 % — ABNORMAL LOW (ref 36.0–46.0)
HCT: 26 % — ABNORMAL LOW (ref 36.0–46.0)
HCT: 26 % — ABNORMAL LOW (ref 36.0–46.0)
HCT: 27 % — ABNORMAL LOW (ref 36.0–46.0)
HCT: 27 % — ABNORMAL LOW (ref 36.0–46.0)
HCT: 30 % — ABNORMAL LOW (ref 36.0–46.0)
Hemoglobin: 10.2 g/dL — ABNORMAL LOW (ref 12.0–15.0)
Hemoglobin: 8.5 g/dL — ABNORMAL LOW (ref 12.0–15.0)
Hemoglobin: 8.8 g/dL — ABNORMAL LOW (ref 12.0–15.0)
Hemoglobin: 8.8 g/dL — ABNORMAL LOW (ref 12.0–15.0)
Hemoglobin: 9.2 g/dL — ABNORMAL LOW (ref 12.0–15.0)
Hemoglobin: 9.2 g/dL — ABNORMAL LOW (ref 12.0–15.0)
O2 Saturation: 88 %
O2 Saturation: 89 %
O2 Saturation: 90 %
O2 Saturation: 94 %
O2 Saturation: 94 %
O2 Saturation: 94 %
Patient temperature: 37.8
Patient temperature: 97.6
Patient temperature: 97.6
Patient temperature: 98.5
Patient temperature: 98.5
Patient temperature: 98.8
Potassium: 3.8 mmol/L (ref 3.5–5.1)
Potassium: 3.9 mmol/L (ref 3.5–5.1)
Potassium: 4.1 mmol/L (ref 3.5–5.1)
Potassium: 4.2 mmol/L (ref 3.5–5.1)
Potassium: 4.2 mmol/L (ref 3.5–5.1)
Potassium: 4.5 mmol/L (ref 3.5–5.1)
Sodium: 139 mmol/L (ref 135–145)
Sodium: 139 mmol/L (ref 135–145)
Sodium: 140 mmol/L (ref 135–145)
Sodium: 140 mmol/L (ref 135–145)
Sodium: 140 mmol/L (ref 135–145)
Sodium: 140 mmol/L (ref 135–145)
TCO2: 22 mmol/L (ref 22–32)
TCO2: 22 mmol/L (ref 22–32)
TCO2: 25 mmol/L (ref 22–32)
TCO2: 25 mmol/L (ref 22–32)
TCO2: 26 mmol/L (ref 22–32)
TCO2: 26 mmol/L (ref 22–32)
pCO2 arterial: 38.3 mmHg (ref 32.0–48.0)
pCO2 arterial: 39.1 mmHg (ref 32.0–48.0)
pCO2 arterial: 41.7 mmHg (ref 32.0–48.0)
pCO2 arterial: 44.3 mmHg (ref 32.0–48.0)
pCO2 arterial: 46.7 mmHg (ref 32.0–48.0)
pCO2 arterial: 48.8 mmHg — ABNORMAL HIGH (ref 32.0–48.0)
pH, Arterial: 7.306 — ABNORMAL LOW (ref 7.350–7.450)
pH, Arterial: 7.309 — ABNORMAL LOW (ref 7.350–7.450)
pH, Arterial: 7.343 — ABNORMAL LOW (ref 7.350–7.450)
pH, Arterial: 7.352 (ref 7.350–7.450)
pH, Arterial: 7.353 (ref 7.350–7.450)
pH, Arterial: 7.372 (ref 7.350–7.450)
pO2, Arterial: 58 mmHg — ABNORMAL LOW (ref 83.0–108.0)
pO2, Arterial: 60 mmHg — ABNORMAL LOW (ref 83.0–108.0)
pO2, Arterial: 61 mmHg — ABNORMAL LOW (ref 83.0–108.0)
pO2, Arterial: 76 mmHg — ABNORMAL LOW (ref 83.0–108.0)
pO2, Arterial: 76 mmHg — ABNORMAL LOW (ref 83.0–108.0)
pO2, Arterial: 76 mmHg — ABNORMAL LOW (ref 83.0–108.0)

## 2020-02-15 LAB — GLUCOSE, CAPILLARY
Glucose-Capillary: 136 mg/dL — ABNORMAL HIGH (ref 70–99)
Glucose-Capillary: 168 mg/dL — ABNORMAL HIGH (ref 70–99)
Glucose-Capillary: 191 mg/dL — ABNORMAL HIGH (ref 70–99)
Glucose-Capillary: 186 mg/dL — ABNORMAL HIGH (ref 70–99)
Glucose-Capillary: 177 mg/dL — ABNORMAL HIGH (ref 70–99)
Glucose-Capillary: 146 mg/dL — ABNORMAL HIGH (ref 70–99)
Glucose-Capillary: 141 mg/dL — ABNORMAL HIGH (ref 70–99)
Glucose-Capillary: 146 mg/dL — ABNORMAL HIGH (ref 70–99)
Glucose-Capillary: 133 mg/dL — ABNORMAL HIGH (ref 70–99)
Glucose-Capillary: 158 mg/dL — ABNORMAL HIGH (ref 70–99)
Glucose-Capillary: 134 mg/dL — ABNORMAL HIGH (ref 70–99)
Glucose-Capillary: 131 mg/dL — ABNORMAL HIGH (ref 70–99)
Glucose-Capillary: 135 mg/dL — ABNORMAL HIGH (ref 70–99)

## 2020-02-15 LAB — CBC
HCT: 28 % — ABNORMAL LOW (ref 36.0–46.0)
HCT: 28.5 % — ABNORMAL LOW (ref 36.0–46.0)
Hemoglobin: 8.2 g/dL — ABNORMAL LOW (ref 12.0–15.0)
Hemoglobin: 8.7 g/dL — ABNORMAL LOW (ref 12.0–15.0)
MCH: 32.5 pg (ref 26.0–34.0)
MCH: 34.1 pg — ABNORMAL HIGH (ref 26.0–34.0)
MCHC: 29.3 g/dL — ABNORMAL LOW (ref 30.0–36.0)
MCHC: 30.5 g/dL (ref 30.0–36.0)
MCV: 111.1 fL — ABNORMAL HIGH (ref 80.0–100.0)
MCV: 111.8 fL — ABNORMAL HIGH (ref 80.0–100.0)
Platelets: 95 10*3/uL — ABNORMAL LOW (ref 150–400)
Platelets: 100 10*3/uL — ABNORMAL LOW (ref 150–400)
RBC: 2.52 MIL/uL — ABNORMAL LOW (ref 3.87–5.11)
RBC: 2.55 MIL/uL — ABNORMAL LOW (ref 3.87–5.11)
RDW: 29.4 % — ABNORMAL HIGH (ref 11.5–15.5)
RDW: 29.2 % — ABNORMAL HIGH (ref 11.5–15.5)
WBC: 9.8 10*3/uL (ref 4.0–10.5)
WBC: 9.9 10*3/uL (ref 4.0–10.5)
nRBC: 1.4 % — ABNORMAL HIGH (ref 0.0–0.2)
nRBC: 0.7 % — ABNORMAL HIGH (ref 0.0–0.2)

## 2020-02-15 LAB — APTT
aPTT: 38 seconds — ABNORMAL HIGH (ref 24–36)
aPTT: 38 seconds — ABNORMAL HIGH (ref 24–36)

## 2020-02-15 LAB — BASIC METABOLIC PANEL
Anion gap: 16 — ABNORMAL HIGH (ref 5–15)
BUN: 58 mg/dL — ABNORMAL HIGH (ref 6–20)
CO2: 19 mmol/L — ABNORMAL LOW (ref 22–32)
Calcium: 8.7 mg/dL — ABNORMAL LOW (ref 8.9–10.3)
Chloride: 103 mmol/L (ref 98–111)
Creatinine, Ser: 2.93 mg/dL — ABNORMAL HIGH (ref 0.44–1.00)
GFR calc Af Amer: 20 mL/min — ABNORMAL LOW (ref >60)
GFR calc non Af Amer: 17 mL/min — ABNORMAL LOW (ref >60)
Glucose, Bld: 186 mg/dL — ABNORMAL HIGH (ref 70–99)
Potassium: 3.8 mmol/L (ref 3.5–5.1)
Sodium: 138 mmol/L (ref 135–145)

## 2020-02-15 LAB — RENAL FUNCTION PANEL
Albumin: 2.4 g/dL — ABNORMAL LOW (ref 3.5–5.0)
Anion gap: 13 (ref 5–15)
BUN: 48 mg/dL — ABNORMAL HIGH (ref 6–20)
CO2: 21 mmol/L — ABNORMAL LOW (ref 22–32)
Calcium: 8.5 mg/dL — ABNORMAL LOW (ref 8.9–10.3)
Chloride: 104 mmol/L (ref 98–111)
Creatinine, Ser: 2.36 mg/dL — ABNORMAL HIGH (ref 0.44–1.00)
GFR calc Af Amer: 26 mL/min — ABNORMAL LOW (ref >60)
GFR calc non Af Amer: 22 mL/min — ABNORMAL LOW (ref >60)
Glucose, Bld: 146 mg/dL — ABNORMAL HIGH (ref 70–99)
Phosphorus: 4.1 mg/dL (ref 2.5–4.6)
Potassium: 4.2 mmol/L (ref 3.5–5.1)
Sodium: 138 mmol/L (ref 135–145)

## 2020-02-15 LAB — HEPARIN LEVEL (UNFRACTIONATED): Heparin Unfractionated: <0.1 IU/mL — ABNORMAL LOW (ref 0.30–0.70)

## 2020-02-15 LAB — LACTIC ACID, PLASMA
Lactic Acid, Venous: 1.3 mmol/L (ref 0.5–1.9)
Lactic Acid, Venous: 1.6 mmol/L (ref 0.5–1.9)

## 2020-02-15 LAB — PHOSPHORUS: Phosphorus: 5.2 mg/dL — ABNORMAL HIGH (ref 2.5–4.6)

## 2020-02-15 LAB — FIBRINOGEN: Fibrinogen: 464 mg/dL (ref 210–475)

## 2020-02-15 LAB — LACTATE DEHYDROGENASE: LDH: 326 U/L — ABNORMAL HIGH (ref 98–192)

## 2020-02-15 MED ORDER — PRISMASOL BGK 4/2.5 32-4-2.5 MEQ/L REPLACEMENT SOLN: Status: DC

## 2020-02-15 MED ORDER — FENTANYL CITRATE (PF) 100 MCG/2ML IJ SOLN
50.0000 ug | Freq: Once | INTRAMUSCULAR | Status: AC
  Administered 2020-02-15: 50 ug via INTRAVENOUS

## 2020-02-15 MED ORDER — HEPARIN SODIUM (PORCINE) 1000 UNIT/ML DIALYSIS
1000.0000 [IU] | INTRAMUSCULAR | Status: DC | PRN
  Administered 2020-02-17: 2800 [IU] via INTRAVENOUS_CENTRAL
  Administered 2020-02-29: 3000 [IU] via INTRAVENOUS_CENTRAL
  Administered 2020-03-03: 2800 [IU] via INTRAVENOUS_CENTRAL
  Filled 2020-02-15 (×3): qty 6
  Filled 2020-02-15: qty 3
  Filled 2020-02-15 (×3): qty 6
  Filled 2020-02-15: qty 2

## 2020-02-15 MED ORDER — PRISMASOL BGK 4/2.5 32-4-2.5 MEQ/L IV SOLN: INTRAVENOUS | Status: DC

## 2020-02-15 MED ORDER — HEPARIN (PORCINE) 2000 UNITS/L FOR CRRT
INTRAVENOUS_CENTRAL | Status: DC | PRN
  Filled 2020-02-15 (×3): qty 1000

## 2020-02-15 NOTE — Procedures (Signed)
Central Venous Catheter Insertion Procedure Note  JETAUN COLBATH  [id number]  05-15-1966  Date:02/15/20  Time:6:19 PM   Provider Performing:Kieanna Rollo Shearon Stalls   Procedure: Insertion of Non-tunneled Central Venous Catheter(36556)with US guidance (96924)    Indication(s) Medication administration and Hemodialysis  Consent Risks of the procedure as well as the alternatives and risks of each were explained to the patient and/or caregiver.  Consent for the procedure was obtained and is signed in the bedside chart  Anesthesia Topical only with 1% lidocaine   Timeout Verified patient identification, verified procedure, site/side was marked, verified correct patient position, special equipment/implants available, medications/allergies/relevant history reviewed, required imaging and test results available.  Sterile Technique Maximal sterile technique including full sterile barrier drape, hand hygiene, sterile gown, sterile gloves, mask, hair covering, sterile ultrasound probe cover (if used).  Procedure Description Area of catheter insertion was cleaned with chlorhexidine and draped in sterile fashion.   With real-time ultrasound guidance a HD catheter was placed into the left internal jugular vein.  Nonpulsatile blood flow and easy flushing noted in all ports.  The catheter was sutured in place and sterile dressing applied.  Complications/Tolerance None; patient tolerated the procedure well. Chest X-ray is ordered to verify placement for internal jugular or subclavian cannulation.  Chest x-ray is not ordered for femoral cannulation.  EBL Minimal  Specimen(s) None   Montey Hora, [place] 02/15/2020, 6:19 PM

## 2020-02-15 NOTE — H&P (Signed)
Chief Complaint: Patient was seen in consultation today for  Chief Complaint  Patient presents with  . Shortness of Breath  . Covid POS    Referring Physician(s): Dr. Tamala Julian  Supervising Physician: Sandi Mariscal  Patient Status: [facility] - In-pt  History of Present Illness: Alisha Stephenson is a 54 y.o. female with a medical history significant for DM, obesity and asthma. She presented to the ED 01/07/20 with severe headaches, shortness of breath (72% sats on room air), ongoing diarrhea and abdominal pain related to COVID-19. She was placed on high flow oxygen and chest x-ray showed infiltrates concerning for pneumonia. She was admitted for acute respiratory failure with hypoxia secondary to COVID-19 infection. She was transferred to the ICU 01/09/20 for worsening hypoxemia and was subsequently intubated. She was started on ECMO 01/14/2020. Mental status changes prompted a CT head on 01/19/20 and multifocal parenchymal hemorrhages were identified. She received a tracheostomy 02/05/20.  Alisha Stephenson is currently receiving nutrition via nasogastric feeding tube but she has been unable to tolerate the volume needed to support her nutritional goals. Her team is currently able to give her trickle feeds only to avoid vomiting.  Interventional Radiology has been asked to evaluate this patient for an image-guided gastrostomy tube with jejunal extension for long-term nutritional needs.  Past Medical History:  Diagnosis Date  . Asthma   . Diabetes mellitus without complication ([place])   . Diverticulitis   . Gallstones   . IBS (irritable bowel syndrome)   . NAFLD (nonalcoholic fatty liver disease)    CT scan 2015  . Ovarian cyst     Past Surgical History:  Procedure Laterality Date  . ABDOMINAL HYSTERECTOMY    . CESAREAN SECTION    . CHOLECYSTECTOMY    . COLONOSCOPY     about 2011  . ECMO CANNULATION N/A 01/09/2020   Procedure: ECMO CANNULATION;  Surgeon: Jolaine Artist, MD;  Location: [facility];  Service: Cardiovascular;  Laterality: N/A;    Allergies: Sulfa antibiotics  Medications: Prior to Admission medications   Medication Sig Start Date End Date Taking? Authorizing Provider  albuterol (PROVENTIL HFA;VENTOLIN HFA) 108 (90 Base) MCG/ACT inhaler Inhale 2 puffs into the lungs every 6 (six) hours as needed for wheezing or shortness of breath. 03/23/18   Shelda Pal, DO  azithromycin (ZITHROMAX) 250 MG tablet Take 2 tabs the first day and then 1 tab daily until you run out. 01/02/20   Nani Ravens, Crosby Oyster, DO  benzonatate (TESSALON) 200 MG capsule Take 200 mg by mouth 3 (three) times daily. 12/31/19   [provider]  Blood Glucose Monitoring Suppl (ONETOUCH VERIO) w/Device KIT Test as directed once weekly to check blood sugar. 04/25/18   Shelda Pal, DO  dicyclomine (BENTYL) 10 MG capsule Take 1 capsule (10 mg total) by mouth 4 (four) times daily -  before meals and at bedtime. 09/13/19   Shelda Pal, DO  doxycycline (VIBRA-TABS) 100 MG tablet Take 100 mg by mouth 2 (two) times daily. 10 day supply 12/31/19   [provider]  fluticasone (FLONASE) 50 MCG/ACT nasal spray PLACE 2 SPRAYS INTO BOTH NOSTRILS DAILY. 08/24/18   Saguier, Percell Miller, PA-C  glucose blood (ONETOUCH VERIO) test strip USE STRIP TO CHECK GLUCOSE THREE TIMES DAILY 08/31/18   Shelda Pal, DO  ibuprofen (ADVIL) 600 MG tablet Take 1 tablet (600 mg total) by mouth every 8 (eight) hours as needed. 01/06/20   Charlesetta Shanks, MD  insulin lispro (HUMALOG)  100 UNIT/ML KwikPen 15 units with each meal + 8 g/1 unit sliding scale. 06/13/19   Shelda Pal, DO  meloxicam (MOBIC) 7.5 MG tablet Take 1 tablet (7.5 mg total) by mouth daily. 12/11/19   Tasia Catchings, Amy V, PA-C  ONE TOUCH LANCETS MISC Use once a week to check blood sugar. 04/25/18   Shelda Pal, DO  tiZANidine (ZANAFLEX) 2 MG tablet Take 1 tablet (2 mg total) by mouth every 8 (eight) hours as  needed for muscle spasms. 12/11/19   Yu, Amy V, PA-C  TOUJEO MAX SOLOSTAR 300 UNIT/ML Solostar Pen INJECT 62 UNITS INTO THE SKIN 2 (TWO) TIMES DAILY. Patient taking differently: Inject 62 Units into the skin 2 (two) times daily.  11/24/19   Shelda Pal, DO     Family History  Problem Relation Age of Onset  . Diabetes Father   . Heart disease Father   . Breast cancer Maternal Grandmother   . Breast cancer Maternal Aunt   . Colon cancer Neg Hx   . Esophageal cancer Neg Hx     Social History   Socioeconomic History  . Marital status: Married    Spouse name: Not on file  . Number of children: 2  . Years of education: Not on file  . Highest education level: Not on file  Occupational History  . Occupation: Therapist, art   Tobacco Use  . Smoking status: Former Research scientist (life sciences)  . Smokeless tobacco: Never Used  . Tobacco comment: quit over 10 years ago  Vaping Use  . Vaping Use: Never used  Substance and Sexual Activity  . Alcohol use: No    Comment: rarely  . Drug use: No  . Sexual activity: Never  Other Topics Concern  . Not on file  Social History Narrative  . Not on file   Social Determinants of Health   Financial Resource Strain:   . Difficulty of Paying Living Expenses: Not on file  Food Insecurity:   . Worried About Charity fundraiser in the Last Year: Not on file  . Ran Out of Food in the Last Year: Not on file  Transportation Needs:   . Lack of Transportation (Medical): Not on file  . Lack of Transportation (Non-Medical): Not on file  Physical Activity:   . Days of Exercise per Week: Not on file  . Minutes of Exercise per Session: Not on file  Stress:   . Feeling of Stress : Not on file  Social Connections:   . Frequency of Communication with Friends and Family: Not on file  . Frequency of Social Gatherings with Friends and Family: Not on file  . Attends Religious Services: Not on file  . Active Member of Clubs or Organizations: Not on file  . Attends  Archivist Meetings: Not on file  . Marital Status: Not on file    Review of Systems: A 12 point ROS discussed and pertinent positives are indicated in the HPI above.  All other systems are negative.  Review of Systems  Unable to perform ROS: Acuity of condition    Vital Signs: BP (!) 117/49   Pulse 84   Temp 98.5 F (36.9 C) (Axillary)   Resp 15   Ht 5' 4"  (1.626 m)   Wt 267 lb 3.2 oz (121.2 kg)   SpO2 91%   BMI 45.86 kg/m   Physical Exam Constitutional:      General: She is not in acute distress.    Appearance: She  is obese. She is ill-appearing.     Comments: Sleepy but opens eyes to voice, follows commands, nods head yes/no appropriately.   HENT:     Head:     Comments: NG tube. Tracheostomy Cardiovascular:     Rate and Rhythm: Normal rate and regular rhythm.     Comments: Right IJ [address] dual lumen catheter Right basilic triple lumen PICC. Left subclavian triple lumen temporary dialysis catheter.  Left radial arterial line Pulmonary:     Breath sounds: Decreased breath sounds present.     Comments: ventilator Abdominal:     General: Bowel sounds are normal.     Palpations: Abdomen is soft.  Genitourinary:    Comments: Currently on CRRT.  Fecal collection system  Skin:    General: Skin is warm and dry.     Imaging: DG Chest 1 View  Result Date: 02/12/2020 CLINICAL DATA:  ARDS on ECMO EXAM: CHEST  1 VIEW COMPARISON:  Yesterday FINDINGS: ECMO catheter in stable position. The tracheostomy tube, bilateral central lines, and enteric tube are unremarkable. The enteric tube is now a gastric suction tube instead of a feeding tube. Unchanged extensive pulmonary opacity. No visible air leak. Stable heart size. IMPRESSION: Unremarkable hardware positioning and stable confluent airspace disease. Electronically Signed   By: Monte Fantasia M.D.   On: 02/12/2020 09:49   DG Chest 1 View  Result Date: 01/28/2020 CLINICAL DATA:  COVID-19 positive,  ECMO, central line thrombosis EXAM: CHEST  1 VIEW COMPARISON:  Chest radiograph from earlier today. FINDINGS: Stable right internal jugular ECMO catheter entering the IVC. Left subclavian central venous catheter terminates over the high left mediastinum, unchanged. Left internal jugular central venous catheter is stable with tip in the region of middle third of the SVC. Right PICC enters the SVC with the tip not well visualized. Enteric tube enters the stomach with the tip not seen on this image. No pneumothorax. Complete opacification of the hemithoraces bilaterally, unchanged. IMPRESSION: 1. Stable support structures. No pneumothorax. Left subclavian central venous catheter tip overlies the high left mediastinum, unchanged, indeterminate in location as described on prior chest radiograph, potentially at the junction of the left internal jugular and left subclavian veins. 2. Stable complete opacification of the hemithoraces bilaterally. Electronically Signed   By: Ilona Sorrel M.D.   On: 01/28/2020 16:25   DG Chest 1 View  Result Date: 01/28/2020 CLINICAL DATA:  Status post line placement today. Shortness of breath. COVID-19 pneumonia. EXAM: CHEST  1 VIEW COMPARISON:  Single-view of the chest 01/28/2020. FINDINGS: New left subclavian central venous catheter tip projects medial to the left clavicular head. The catheter crosses the left IJ catheter and its position is unclear. It may be indenting medial wall of the junction of the left internal jugular and subclavian veins. ECMO cannula, feeding tube and left IJ catheter are unchanged. There is complete whiteout of the chest bilaterally. Cardiac silhouette is not visible. IMPRESSION: Position of the tip of the patient's left subclavian catheter tip is indeterminate. It may be indenting the medial wall of the junction of the left subclavian and left internal jugular veins. No change in complete whiteout of the chest. Critical Value/emergent results were called by  telephone at the time of interpretation on 01/28/2020 at 3:29 pm to provider [facility] , who verbally acknowledged these results. Electronically Signed   By: Inge Rise M.D.   On: 01/28/2020 15:33   DG Abd 1 View  Result Date: 02/09/2020 CLINICAL DATA:  NG  placement EXAM: ABDOMEN - 1 VIEW COMPARISON:  Chest 02/09/2020 FINDINGS: Feeding tube enters the stomach with the tip in the region of the antrum or duodenal bulb. Normal bowel gas pattern Large bore vascular catheter in the right atrium/distal IVC unchanged from chest x-ray. IMPRESSION: Feeding tube tip in the region the gastric antrum or duodenal bulb. Normal bowel gas pattern. Electronically Signed   By: Franchot Gallo M.D.   On: 02/09/2020 12:08   CT Head Wo Contrast  Result Date: 01/22/2020 CLINICAL DATA:  Intracranial hemorrhage, follow-up EXAM: CT HEAD WITHOUT CONTRAST TECHNIQUE: Contiguous axial images were obtained from the base of the skull through the vertex without intravenous contrast. COMPARISON:  01/20/2020 FINDINGS: Brain: Multifocal parenchymal hemorrhage is not substantially changed. The largest area hemorrhage in the right parietal region is similar in size. Associated edema and regional mass effect are also similar. There is no new loss of gray-white differentiation. Ventricles are stable in size. No evidence of intraventricular extension of hemorrhage. Vascular: No new findings. Skull: Calvarium is unremarkable. Sinuses/Orbits: Nonspecific paranasal sinus mucosal thickening and opacification. No new orbital finding. Other: Nonspecific mastoid and middle ear effusions. IMPRESSION: Continued stability with no substantial change in multifocal parenchymal hemorrhages and associated mild regional mass effect. No new hemorrhage. Electronically Signed   By: Macy Mis M.D.   On: 01/22/2020 14:26   CT HEAD WO CONTRAST  Result Date: 01/20/2020 CLINICAL DATA:  Intracranial hemorrhage, follow-up EXAM: CT HEAD WITHOUT CONTRAST  TECHNIQUE: Contiguous axial images were obtained from the base of the skull through the vertex without intravenous contrast. COMPARISON:  01/19/2020 FINDINGS: Brain: Multifocal parenchymal hemorrhage is again identified. As noted previously, greatest involvement in the biparietal region with the largest area of hemorrhage on the right measuring up to 3.7 cm as before. There is mild edema associated with the areas of hemorrhage without substantial mass effect. No new hemorrhage is identified. Gray-white differentiation remains preserved. Ventricles stable in size. Vascular: No new finding. Skull: Calvarium is unremarkable. Sinuses/Orbits: No acute finding. Other: Partially imaged facial subcutaneous edema. IMPRESSION: No substantial change in multifocal parenchymal hemorrhages. Regional mass effect remains mild. No new hemorrhage. Electronically Signed   By: Macy Mis M.D.   On: 01/20/2020 11:30   CT HEAD WO CONTRAST  Result Date: 01/19/2020 CLINICAL DATA:  Mental status change, COVID positive, on ECMO EXAM: CT HEAD WITHOUT CONTRAST TECHNIQUE: Contiguous axial images were obtained from the base of the skull through the vertex without intravenous contrast. COMPARISON:  01/06/2020 FINDINGS: Brain: Multifocal areas of parenchymal hemorrhage identified bilaterally with greatest involvement of the parietal lobes. The largest area the right parietal lobe measures approximately 3.8 x 2.3 cm. There is edema associated with these hemorrhages causing mild regional mass effect. No intraventricular extension. No hydrocephalus. Gray-white differentiation is preserved. Vascular: No hyperdense vessel or unexpected calcification. Skull: Calvarium is unremarkable. Sinuses/Orbits: Nonspecific extensive paranasal sinus opacification. Other: Nonspecific mastoid and middle ear opacification. Partially imaged endotracheal and enteric tubes. IMPRESSION: Multifocal parenchymal hemorrhages, with the largest within the right parietal  lobe. Together with associated edema, there is mild regional mass effect but no herniation. Considerations include sequelae of ECMO anticoagulation, hemorrhagic conversion of embolic infarcts, and vasculitis/vasculopathy. These results were called by telephone at the time of interpretation on 01/19/2020 at 2:41 pm to provider Noemi Chapel , who verbally acknowledged these results. Electronically Signed   By: Macy Mis M.D.   On: 01/19/2020 14:45   CT CHEST ABDOMEN PELVIS W CONTRAST  Result Date: 01/19/2020 CLINICAL DATA:  Sepsis. Leukocytosis. Respiratory failure. COVID ARDS. EXAM: CT CHEST, ABDOMEN, AND PELVIS WITH CONTRAST TECHNIQUE: Multidetector CT imaging of the chest, abdomen and pelvis was performed following the standard protocol during bolus administration of intravenous contrast. CONTRAST:  162m OMNIPAQUE IOHEXOL 350 MG/ML SOLN COMPARISON:  Abdomen and pelvis CT dated 05/10/2014, portable chest obtained earlier today. FINDINGS: CT CHEST FINDINGS Cardiovascular: The right jugular ECMO catheter extends through the superior vena cava and into the inferior vena cava with its tip above the level of the renal veins. Mildly enlarged heart. Minimal pericardial fluid with a maximum thickness of 5 mm. Mediastinum/Nodes: Endotracheal tube tip 1 cm above the carina. Feeding tube extending into the stomach. Unremarkable included thyroid gland. No enlarged lymph nodes. Lungs/Pleura: Dense airspace opacity throughout both lungs with air bronchograms. Small to moderate-sized right pleural effusion and small left pleural effusion. Musculoskeletal: Thoracic spine degenerative changes. Lower cervical spine fixation hardware. CT ABDOMEN PELVIS FINDINGS Hepatobiliary: No focal liver abnormality is seen. Status post cholecystectomy. No biliary dilatation. Pancreas: Unremarkable. No pancreatic ductal dilatation or surrounding inflammatory changes. Spleen: Normal in size without focal abnormality. Adrenals/Urinary Tract:  Foley catheter in the urinary bladder with no urine in the bladder. There is associated air in the bladder. Normal appearing adrenal glands, kidneys and ureters. Stomach/Bowel: Large number of sigmoid and distal descending colon diverticula without evidence of diverticulitis. Normal appearing appendix, small bowel and stomach. Feeding tube tip in the mid to distal stomach. Rectal balloon catheter. Vascular/Lymphatic: No significant vascular findings are present. No enlarged abdominal or pelvic lymph nodes. Reproductive: Status post hysterectomy. No adnexal masses. Other: Mild bilateral subcutaneous edema. Small amount of free peritoneal fluid. Musculoskeletal: Lumbar spine degenerative changes. IMPRESSION: 1. Dense airspace opacity throughout both lungs with air bronchograms, compatible with the clinical diagnosis of COVID ARDS. Dense bilateral pneumonia could also have this appearance. 2. Small to moderate-sized right pleural effusion and small left pleural effusion. 3. Small amount of ascites. 4. Colonic diverticulosis. Electronically Signed   By: SClaudie ReveringM.D.   On: 01/19/2020 15:23   DG CHEST PORT 1 VIEW  Result Date: 02/15/2020 CLINICAL DATA:  Tracheostomy. ECMO. Respiratory failure. Recent COVID. EXAM: PORTABLE CHEST 1 VIEW COMPARISON:  02/14/2020. FINDINGS: Tracheostomy tube, NG tube, right PICC line, ECMO device in stable position. Left subclavian line stable position with tip over left subclavian vein. Complete consolidation of both lungs again noted. Heart is obscured. Prior cervical fusion. IMPRESSION: 1.  Lines and tubes stable position. 2. Complete consolidation of both lungs again noted. No interim change. Electronically Signed   By: TMarcello Moores Register   On: 02/15/2020 07:42   DG CHEST PORT 1 VIEW  Result Date: 02/14/2020 CLINICAL DATA:  ECMO EXAM: PORTABLE CHEST 1 VIEW COMPARISON:  Yesterday FINDINGS: Endotracheal tube tip is at the clavicular heads. Bilateral central line in unchanged  position. ECMO catheter in stable position. The enteric tube at least reaches the mid stomach. Airless lungs and obscured heart size. No visible air leak. IMPRESSION: 1. Stable hardware positioning. 2. Airless lungs. Electronically Signed   By: JMonte FantasiaM.D.   On: 02/14/2020 07:41   DG CHEST PORT 1 VIEW  Result Date: 02/13/2020 CLINICAL DATA:  Respiratory failure.  Recent COVID. EXAM: PORTABLE CHEST 1 VIEW COMPARISON:  02/12/2020 FINDINGS: Tracheostomy, enteric tube, and central lines are unchanged in position. Diffuse airspace consolidation in both lungs without change. Heart size is obscured. IMPRESSION: Diffuse airspace consolidation in both lungs without change. Electronically Signed   By: WOren BeckmannD.  On: 02/13/2020 06:29   DG CHEST PORT 1 VIEW  Result Date: 02/11/2020 CLINICAL DATA:  ECMO EXAM: PORTABLE CHEST 1 VIEW COMPARISON:  02/10/2020 FINDINGS: Tracheostomy in satisfactory position.  ECMO catheter. Enteric tube courses into the stomach. Right arm PICC is obscured but likely terminates at the cavoatrial junction. Left subclavian catheter terminates at the level of the clavicle. Multifocal patchy opacities with slightly improved aeration of the lungs bilaterally. No pneumothorax. The cardiomediastinal silhouette is largely obscured. Cervical spine fixation hardware, incompletely visualized. IMPRESSION: Stable support apparatus in this patient on ECMO. Multifocal patchy opacities with slightly improved aeration of the lungs bilaterally. Electronically Signed   By: Julian Hy M.D.   On: 02/11/2020 07:44   DG CHEST PORT 1 VIEW  Result Date: 02/10/2020 CLINICAL DATA:  ECMO EXAM: PORTABLE CHEST 1 VIEW COMPARISON:  Yesterday FINDINGS: Tracheostomy tube in place. Left subclavian line with tip at the subclavian level. Feeding tube at least reaches the diaphragm. ECMO catheter in similar position. The ECMO catheter obscures the tip of the right PICC. Airless lungs. Obscured heart  size. No visible air leak. IMPRESSION: 1. Stable hardware positioning. 2. Airless lungs. Electronically Signed   By: Monte Fantasia M.D.   On: 02/10/2020 08:47   DG CHEST PORT 1 VIEW  Result Date: 02/09/2020 CLINICAL DATA:  COVID positive.  Respiratory failure.  ECMO. EXAM: PORTABLE CHEST 1 VIEW COMPARISON:  Multiple recent previous exams. FINDINGS: Confluent bilateral airspace opacity is again noted and appears progressive in left upper lobe. Tracheostomy tube again noted in situ. Right PICC line tip is difficult to visualize but is probably in the right subclavian vein but may be obscured by the ECMO catheter. Left IJ central line tip is positioned over the innominate vein confluence. There is a multi lumen left subclavian catheter with the tip overlying the medial aspect of the left subclavian vein. IMPRESSION: 1. Persistent diffuse bilateral airspace disease with interval progression in the left upper lobe. 2. Stable position of support tubing. Electronically Signed   By: Misty Stanley M.D.   On: 02/09/2020 07:21   DG CHEST PORT 1 VIEW  Result Date: 02/08/2020 CLINICAL DATA:  Respiratory failure on ECMO.  Recent COVID. EXAM: PORTABLE CHEST 1 VIEW COMPARISON:  02/07/2020 FINDINGS: Similar positioning of a midline tracheostomy tube, left IJ approach central venous catheter, left subclavian central line, right PICC line, feeding tube and ECMO device. Diffuse opacification of both hemi thoraces is again noted with slightly decreased aeration of the lungs. Cardiac silhouette is obscured. Prior cervical fusion hardware. IMPRESSION: Slightly decreased aeration of the lungs. Otherwise, no substantial change. Electronically Signed   By: Margaretha Sheffield MD   On: 02/08/2020 08:20   DG CHEST PORT 1 VIEW  Result Date: 02/07/2020 CLINICAL DATA:  COVID pneumonia with respiratory failure.  ECMO. EXAM: PORTABLE CHEST 1 VIEW COMPARISON:  Multiple recent previous exams. FINDINGS: The diffuse confluent airspace opacity  in both lungs is again noted with some slight interval improvement at the left upper lung. Tracheostomy tube remains in place. A feeding tube passes into the stomach although the distal tip position is not included on the film. Left IJ central line tip overlies the innominate vein confluence. Left subclavian central line tip overlies the central left subclavian vein. Right PICC line tip overlies the mid SVC level. Telemetry leads overlie the chest. ECMO cannula again noted. IMPRESSION: Slight interval improvement in the left upper lung. Otherwise no substantial change in exam. Electronically Signed   By: Misty Stanley  M.D.   On: 02/07/2020 08:54   DG CHEST PORT 1 VIEW  Result Date: 02/06/2020 CLINICAL DATA:  Respiratory failure.  ECMO.  Recent COVID. EXAM: PORTABLE CHEST 1 VIEW COMPARISON:  02/05/2020. FINDINGS: Tracheostomy tube, feeding tube, left IJ line, right PICC line, ECMO device in stable position. Diffuse opacification of both hemi thoraces again noted with slight aeration in both lungs noted on today's exam. Heart size cannot be evaluated. Prior cervical spine fusion. IMPRESSION: 1.  Lines and tubes including ECMO device in stable position. 2. Diffuse opacification of both hemi thoraces again noted with slight aeration of both lungs noted on today's exam. Electronically Signed   By: Marcello Moores  Register   On: 02/06/2020 06:53   DG Chest Port 1 View  Result Date: 02/05/2020 CLINICAL DATA:  Shortness of breath. EXAM: PORTABLE CHEST 1 VIEW COMPARISON:  Same day. FINDINGS: Tracheostomy tube is in good position. Feeding tube is seen entering stomach. Right-sided PICC line is unchanged. Left internal jugular catheter is unchanged. ECMO device is noted. Complete opacification of the lungs is noted bilaterally suggesting pneumonia, atelectasis or effusion. No pneumothorax is noted. Bony thorax is unremarkable. IMPRESSION: Stable support apparatus. Complete opacification of the lungs is noted bilaterally  suggesting pneumonia, atelectasis or effusion. Electronically Signed   By: Marijo Conception M.D.   On: 02/05/2020 14:53   DG CHEST PORT 1 VIEW  Result Date: 02/05/2020 CLINICAL DATA:  54 year old female with respiratory failure on ECMO. EXAM: PORTABLE CHEST 1 VIEW COMPARISON:  Chest CT scratch the chest x-ray 02/04/2020. FINDINGS: An endotracheal tube is in place with tip 4.8 cm above the carina. Right upper extremity PICC with tip terminating in the mid to distal superior vena cava. There is a left-sided internal jugular central venous catheter with tip terminating in the mid superior vena cava. A feeding tube is seen extending into the abdomen, however, the tip of the feeding tube extends below the lower margin of the image. Esophageal thermistor noted. ECMO cannula noted. Complete opacification in the lungs bilaterally with multiple air bronchograms. Orthopedic fixation hardware in the lower cervical spine incidentally noted. IMPRESSION: 1. Radiographic appearance the chest is very similar to the prior study, as above. Electronically Signed   By: Vinnie Langton M.D.   On: 02/05/2020 08:15   DG CHEST PORT 1 VIEW  Result Date: 02/04/2020 CLINICAL DATA:  ECMO. EXAM: PORTABLE CHEST 1 VIEW COMPARISON:  February 03, 2020 FINDINGS: The ETT is in good position. The feeding tube terminates below today's film. A left central line is stable. A right PICC line is again seen although the distal tip is difficult to visualize. An ECMO device remains. Near complete opacification of the lungs remains. No other changes. A left subclavian central line is stable. IMPRESSION: 1. Support apparatus as above. 2. Near complete opacification of the lungs is stable. Electronically Signed   By: Dorise Bullion III M.D   On: 02/04/2020 10:51   DG CHEST PORT 1 VIEW  Result Date: 02/03/2020 CLINICAL DATA:  COVID positive with respiratory distress. EXAM: PORTABLE CHEST 1 VIEW COMPARISON:  February 02, 2020 FINDINGS: There is stable  endotracheal tube, nasogastric tube and left internal jugular venous catheter positioning. Marked severity opacification of both lungs is seen with a very mild amount of aerated perihilar regions, bilaterally. This is very mildly improved when compared to the prior study. The heart size and mediastinal contours are subsequently limited in evaluation. A radiopaque fusion plate and screws are seen overlying the cervical spine. The  visualized skeletal structures are otherwise unremarkable. IMPRESSION: Marked severity opacification of both lungs with a very mild amount of aerated perihilar regions, bilaterally. Electronically Signed   By: Virgina Norfolk M.D.   On: 02/03/2020 15:04   DG CHEST PORT 1 VIEW  Result Date: 02/02/2020 CLINICAL DATA:  ETT, respiratory failure, on ECMO EXAM: PORTABLE CHEST 1 VIEW COMPARISON:  02/01/2020 FINDINGS: Diffuse opacification of the lungs bilaterally with associated ECMO catheter. Endotracheal tube terminates 4.5 cm above the carina. Left IJ venous catheter terminates in the mid SVC. Enteric tube courses into the stomach. Cardiomediastinal silhouette is obscured. IMPRESSION: Diffuse opacification of the lungs bilaterally with associated ECMO catheter. Endotracheal tube terminates 4.5 cm above the carina. Additional support apparatus as above. Electronically Signed   By: Julian Hy M.D.   On: 02/02/2020 07:43   DG CHEST PORT 1 VIEW  Result Date: 02/01/2020 CLINICAL DATA:  ECMO EXAM: PORTABLE CHEST 1 VIEW COMPARISON:  January 31, 2020 FINDINGS: The cardiomediastinal silhouette is obscured.ETT tip terminates 2.2 cm above the carina. The enteric tube courses through the chest to the abdomen beyond the field-of-view. Esophageal temperature probe. ECMO cannulation support apparatus. LEFT IJ CVC tip terminates over the confluence of LEFT brachiocephalic vein and the SVC. LEFT subclavian approach CVC tip terminates over the region of the LEFT subclavian vessels. RIGHT upper  extremity PICC tip is obscured by overlying ECMO cannulation support apparatus but is favored to terminate over the region of the RIGHT subclavian vessels. No pneumothorax. Near complete opacification of bilateral lungs with scattered air bronchograms bilaterally. Visualized abdomen is unremarkable. Multilevel degenerative changes of the thoracic spine. IMPRESSION: 1.  Support apparatus as described above. 2. Near complete opacification of bilateral lungs with scattered air bronchograms bilaterally. Electronically Signed   By: Valentino Saxon MD   On: 02/01/2020 08:11   DG CHEST PORT 1 VIEW  Result Date: 01/31/2020 CLINICAL DATA:  Respiratory failure EXAM: PORTABLE CHEST 1 VIEW COMPARISON:  January 30, 2020 FINDINGS: Endotracheal tube tip is 4.5 cm above the carina. ECMO catheter present on the right with tip below diaphragm. Feeding tube tip is below the diaphragm. Left jugular catheter tip is in the superior vena cava. No pneumothorax. There is persistent diffuse airspace opacity bilaterally with suspected underlying pleural effusions. Grossly stable cardiac silhouette. IMPRESSION: Tube and catheter positions as described without pneumothorax. Diffuse airspace opacity bilaterally with suspected associated pleural effusions. Appearance indicative of ARDS superimposed with infection and/or edema. No appreciable change from 1 day prior. Electronically Signed   By: Lowella Grip III M.D.   On: 01/31/2020 08:24   DG CHEST PORT 1 VIEW  Result Date: 01/30/2020 CLINICAL DATA:  ECMO.  ARDS due to COVID 19 virus. EXAM: PORTABLE CHEST 1 VIEW COMPARISON:  One-view chest x-ray 01/30/2019 FINDINGS: Heart is obscured by airspace disease. The tip of the ECMO catheter is at the level of T12, the inferior cavoatrial junction. Left IJ line, endotracheal tube, and enteric tube are stable. The lungs are diffusely opacified bilaterally. Air bronchograms are noted. IMPRESSION: 1. Stable appearance of diffuse bilateral  interstitial and airspace disease, compatible ARDS/infection. 2. The tip of the ECMO catheter is at the level of T12, the inferior cavoatrial junction. Electronically Signed   By: San Morelle M.D.   On: 01/30/2020 14:09   DG CHEST PORT 1 VIEW  Result Date: 01/30/2020 CLINICAL DATA:  Respiratory failure EXAM: PORTABLE CHEST 1 VIEW COMPARISON:  January 29, 2020. FINDINGS: ECMO catheter noted on the right with tip below the  diaphragm. Left jugular catheter tip is in the superior vena cava, stable. Left subclavian catheter tip in the left subclavian vein near the junction with the left innominate vein, stable. Feeding tube tip is below the diaphragm. No pneumothorax. There is diffuse airspace opacity bilaterally, stable. There may be superimposed pleural effusions. Cardiac border difficult to discern given the diffuse airspace opacities. No gross change in cardiac silhouette. IMPRESSION: Tube and catheter positions as described without pneumothorax. Widespread airspace opacity bilaterally with potential superimposed effusions. Appearance essentially stable compared to 1 day prior. Electronically Signed   By: Lowella Grip III M.D.   On: 01/30/2020 08:16   DG CHEST PORT 1 VIEW  Result Date: 01/29/2020 CLINICAL DATA:  COVID 19 virus infection. Acute respiratory failure. On ECMO. EXAM: PORTABLE CHEST 1 VIEW COMPARISON:  01/28/2020 FINDINGS: Left subclavian central venous catheter tip is again seen overlying the distal left subclavian vein. Left jugular central venous catheter tip remains in the proximal SVC. Feeding tube remains in place as well as ECMO catheter. Complete opacification of bilateral hemi-thoraces again seen, without significant change. IMPRESSION: Complete opacification of bilateral hemi-thoraces, without significant change. Stable support lines and tubes. Electronically Signed   By: Marlaine Hind M.D.   On: 01/29/2020 07:41   DG CHEST PORT 1 VIEW  Result Date: 01/28/2020 CLINICAL  DATA:  Severe acute respiratory failure.  COVID-19. EXAM: PORTABLE CHEST 1 VIEW COMPARISON:  None. FINDINGS: An ECMO cannula is again identified. The feeding tube terminates below today's film. The left IJ is stable terminating in the SVC. Complete opacification of the lungs bilaterally obscuring the heart mediastinal contours remains. No significant change. No pneumothorax. A right PICC line probably terminates in the right subclavian vein, unchanged. Orthopedic fixation hardware remains in the lower cervical spine, only partly imaged today. IMPRESSION: 1. Support apparatus as above. 2. Complete opacification of the lungs bilaterally, unchanged in the interval. Electronically Signed   By: Dorise Bullion III M.D   On: 01/28/2020 08:25   DG CHEST PORT 1 VIEW  Result Date: 01/27/2020 CLINICAL DATA:  54 year old female with history of cardiorespiratory failure. EXAM: PORTABLE CHEST 1 VIEW COMPARISON:  Chest x-ray 01/26/2020. FINDINGS: ECMO cannula noted. Left internal jugular central venous catheter with tip terminating in the proximal superior vena cava. A feeding tube is seen extending into the abdomen, however, the tip of the feeding tube extends below the lower margin of the image. Complete opacification of the lungs bilaterally obscuring the heart and mediastinal contours. No pneumothorax. Orthopedic fixation hardware throughout the lower cervical spine incidentally noted. IMPRESSION: 1. Support apparatus, as above. 2. Complete opacification of the lungs bilaterally, similar to the prior study. Electronically Signed   By: Vinnie Langton M.D.   On: 01/27/2020 08:33   DG CHEST PORT 1 VIEW  Result Date: 01/26/2020 CLINICAL DATA:  Hypoxia EXAM: PORTABLE CHEST 1 VIEW COMPARISON:  January 25, 2020 FINDINGS: ECMO catheter tip is below the diaphragm. Feeding tube tip is below the diaphragm. Left jugular catheter tip is in the superior vena cava. No pneumothorax. Diffuse opacification of the lungs bilaterally  with likely superimposed pleural effusions noted. Heart is obscured by diffuse airspace opacity. Grossly stable cardiac silhouette. Postoperative change noted in the lower cervical spine. IMPRESSION: Tube and catheter positions as described without appreciable pneumothorax. Diffuse airspace opacity throughout the lungs with likely superimposed pleural effusions noted. Grossly stable cardiac silhouette. Electronically Signed   By: Lowella Grip III M.D.   On: 01/26/2020 08:05   DG CHEST PORT  1 VIEW  Result Date: 01/25/2020 CLINICAL DATA:  Respiratory failure.  ECMO.  COVID positive. EXAM: PORTABLE CHEST 1 VIEW COMPARISON:  01/24/2020. FINDINGS: Feeding tube, left IJ line, right PICC line, ECMO device in stable position. Complete opacification of both lungs again noted. No interim change. Heart size cannot be accessed due to the complete of opacification of both hemi thoraces. No pneumothorax.  Prior cervical spine fusion. IMPRESSION: 1. Feeding tube, left IJ line, right PICC line, ECMO device in stable position. 2.  Complete opacification of both lungs again noted. Electronically Signed   By: Marcello Moores  Register   On: 01/25/2020 07:10   DG CHEST PORT 1 VIEW  Result Date: 01/24/2020 CLINICAL DATA:  Respiratory failure.  COVID-19 positive EXAM: PORTABLE CHEST 1 VIEW COMPARISON:  January 23, 2020 FINDINGS: ECMO catheter extends along the right medial hemithorax with tip below the diaphragm. Left jugular catheter tip is in the superior vena cava. Feeding tube tip extends below the diaphragm. No pneumothorax evident. Diffuse airspace opacity with probable underlying pleural effusions persists. Diffuse airspace opacity obscures heart borders. No gross cardiomegaly appreciable. There is postoperative change in the lower cervical region. IMPRESSION: Tube and catheter positions as described without pneumothorax. Diffuse airspace opacity essentially completely obscuring the lung parenchyma bilaterally. There may be  underlying pleural effusions. Appearance similar to 1 day prior. Electronically Signed   By: Lowella Grip III M.D.   On: 01/24/2020 08:20   DG CHEST PORT 1 VIEW  Result Date: 01/23/2020 CLINICAL DATA:  Respiratory failure.  COVID positive.  ECMO. EXAM: PORTABLE CHEST 1 VIEW COMPARISON:  01/23/2020. FINDINGS: Feeding tube, left IJ line, ECMO device in stable position. Complete opacification of both lungs again noted. Heart size cannot be evaluated. No pneumothorax. Prior cervical fusion. IMPRESSION: 1.  Lines and tubes including ECMO catheter stable position. 2. Complete opacification of both lungs again noted without interim change. Electronically Signed   By: Marcello Moores  Register   On: 01/23/2020 07:23   DG CHEST PORT 1 VIEW  Result Date: 01/23/2020 CLINICAL DATA:  Cardio respiratory failure EXAM: PORTABLE CHEST 1 VIEW COMPARISON:  Radiograph yesterday.  CT 01/19/2020 FINDINGS: The endotracheal tube is not visualized on the current exam. There is an enteric tube in place with tip below the diaphragm. Left internal jugular central venous catheter tip projects in the region of the upper SVC. ECMO catheter tip below the diaphragm not included in the field of view. There is a right upper extremity PICC tip in the SVC. Near complete opacification of both hemi thoraces with only small portion of aerated lung in the upper lobes. Near complete bilateral lung white out. Cardiomediastinal contours are obscured and not well visualized. IMPRESSION: 1. Progressive advanced bilateral airspace disease with diffuse bilateral lung opacity. 2. Endotracheal tube is not visualized on the current exam. Remaining support apparatus unchanged. Electronically Signed   By: Keith Rake M.D.   On: 01/23/2020 00:55   DG CHEST PORT 1 VIEW  Result Date: 01/22/2020 CLINICAL DATA:  On ECMO.  COVID-19 positive. EXAM: PORTABLE CHEST 1 VIEW COMPARISON:  1 day prior FINDINGS: Endotracheal tube terminates 5.0 cm above carina.  Nasogastric tube extends beyond the inferior aspect of the film. ECMO catheter unchanged in position. Left internal jugular line tip at low SVC. Right-sided PICC line is difficult to follow centrally. Diffuse white out of the chest, relatively similar with mild progression in the left upper lung. No pneumothorax. IMPRESSION: Minimally worsened left-sided aeration with complete whiteout of the chest. No pneumothorax  or other acute complication. Electronically Signed   By: Abigail Miyamoto M.D.   On: 01/22/2020 08:02   DG CHEST PORT 1 VIEW  Result Date: 01/21/2020 CLINICAL DATA:  NG tube placement. EXAM: PORTABLE CHEST 1 VIEW COMPARISON:  01/21/2020 FINDINGS: Endotracheal tube with tip 4.2 cm above the carina. Right-sided PICC line has tip over the region of the SVC. Left IJ central venous catheter has tip over the SVC. Interval placement of enteric tube which courses into the region of the stomach and off the film as tip is not visualized. ECMO apparatus unchanged. Lungs are adequately inflated demonstrate persistent severe airspace consolidation bilaterally with air bronchograms and slightly improved aeration over the left apex. Findings likely due to combination of infection and ARDS. Possible associated bilateral effusions/bibasilar atelectasis. Remainder of the exam is unchanged. IMPRESSION: 1. Persistent severe bilateral airspace process with air bronchograms with slightly improved aeration over the left apex. Findings likely due to combination of infection and ARDS. Possible associated bilateral effusions/bibasilar atelectasis. 2. Tubes and lines as described. Electronically Signed   By: Marin Olp M.D.   On: 01/21/2020 13:51   DG CHEST PORT 1 VIEW  Result Date: 01/21/2020 CLINICAL DATA:  COVID-19 positivity with ECMO EXAM: PORTABLE CHEST 1 VIEW COMPARISON:  01/20/2020 FINDINGS: Endotracheal tube and left jugular central line is well as ECMO cannula are again identified. Right-sided PICC line is seen and  stable. Feeding catheter has been removed in the interval. Increasing opacity is noted throughout both lungs consistent with progression of consolidation. IMPRESSION: Increasing opacity in the lungs bilaterally. Tubes and lines as described. Electronically Signed   By: Inez Catalina M.D.   On: 01/21/2020 07:17   DG CHEST PORT 1 VIEW  Result Date: 01/20/2020 CLINICAL DATA:  Respiratory failure, COVID-19 positivity and ECMO EXAM: PORTABLE CHEST 1 VIEW COMPARISON:  01/19/2020 FINDINGS: Cardiac shadow is stable. Left jugular central line, endotracheal tube and feeding catheter are noted in satisfactory position. ECMO cannulas are noted and stable. Diffuse bilateral airspace opacities are noted consistent with the given clinical history. IMPRESSION: Stable appearance of the chest when compared with the prior exam. Electronically Signed   By: Inez Catalina M.D.   On: 01/20/2020 08:27   DG CHEST PORT 1 VIEW  Result Date: 01/19/2020 CLINICAL DATA:  Hypoxia EXAM: PORTABLE CHEST 1 VIEW COMPARISON:  January 18, 2020 FINDINGS: Endotracheal tube tip is 2.8 cm above the carina. Feeding tube tip is below the diaphragm. Central catheter tips are in the superior vena cava. ECMO catheter extends below the diaphragm on the right. No pneumothorax. There is widespread airspace opacity bilaterally with underlying pleural effusions. Heart is prominent, stable. Pulmonary vascularity largely obscured by overlying airspace opacity. Postoperative change noted in the lower cervical region. IMPRESSION: Persistent widespread airspace opacity with underlying pleural effusions. Tube and catheter positions as described without pneumothorax. Stable cardiac silhouette. Electronically Signed   By: Lowella Grip III M.D.   On: 01/19/2020 08:20   DG CHEST PORT 1 VIEW  Result Date: 01/18/2020 CLINICAL DATA:  COVID-19 positivity, ECMO EXAM: PORTABLE CHEST 1 VIEW COMPARISON:  01/17/2020 FINDINGS: Endotracheal tube, feeding catheter, left  jugular central line and right-sided PICC line are again identified and stable. Large ECMO cannula is noted in the right neck. Cardiac shadow is stable. Slight improved aeration is noted in both lungs although persistent opacities remain consistent with the given clinical history. No bony abnormality is noted. IMPRESSION: Slight improved aeration bilaterally. Tubes and lines as described. Electronically Signed  By: Inez Catalina M.D.   On: 01/18/2020 08:18   DG CHEST PORT 1 VIEW  Result Date: 01/17/2020 CLINICAL DATA:  COVID positive on ECMO EXAM: PORTABLE CHEST 1 VIEW COMPARISON:  Earlier same day FINDINGS: Endotracheal tube, left IJ central line, right PICC line, and enteric tube are again identified. ECMO cannula is also again seen. Persistent diffuse opacification of bilateral lungs. Cardiomediastinal contours are obscured. IMPRESSION: Lines and tubes as above. Similar appearance of diffuse bilateral pulmonary opacification. Electronically Signed   By: Macy Mis M.D.   On: 01/17/2020 09:34   DG CHEST PORT 1 VIEW  Result Date: 01/17/2020 CLINICAL DATA:  COVID-19 positivity EXAM: PORTABLE CHEST 1 VIEW COMPARISON:  01/16/2020 FINDINGS: Diffuse airspace opacity is identified stable in appearance from the prior exam. Feeding catheter, endotracheal tube, left jugular central line and ECMO cannula are again noted and stable. Right-sided PICC line is noted in satisfactory position. IMPRESSION: Tubes and lines as described stable in appearance. Diffuse opacification of lungs bilaterally with minimal aeration in the central aspect of the right lung. Electronically Signed   By: Inez Catalina M.D.   On: 01/17/2020 08:25   DG CHEST PORT 1 VIEW  Result Date: 01/16/2020 CLINICAL DATA:  Cardio respiratory failure EXAM: PORTABLE CHEST 1 VIEW COMPARISON:  01/16/2020 FINDINGS: Support devices including ECMO are stable. Near complete opacification of both lungs, worsening since prior study. Difficult to visualize  the heart. IMPRESSION: Worsening aeration with near complete opacification of both lungs. Electronically Signed   By: Rolm Baptise M.D.   On: 01/16/2020 22:51   DG CHEST PORT 1 VIEW  Result Date: 01/16/2020 CLINICAL DATA:  ECMO EXAM: PORTABLE CHEST 1 VIEW COMPARISON:  01/16/2020 FINDINGS: Support devices are stable. Diffuse airspace disease throughout the lungs, stable or slightly worsening since prior study. Suspect layering effusions. Heart is mildly enlarged. No acute bony abnormality. IMPRESSION: Severe diffuse bilateral airspace disease, stable or slightly worsening. Suspect layering effusions. Electronically Signed   By: Rolm Baptise M.D.   On: 01/16/2020 18:19   DG Abd Portable 1V  Result Date: 02/12/2020 CLINICAL DATA:  Feeding tube dysfunction EXAM: PORTABLE ABDOMEN - 1 VIEW COMPARISON:  02/11/2020 FINDINGS: Nonobstructive bowel gas pattern. Enteric tube terminates in the mid gastric body. Additional feeding tube terminates in the distal gastric body and is looped in the stomach. Cholecystectomy clips. ECMO catheter. IMPRESSION: Nonobstructive bowel gas pattern. Enteric tube terminates in the mid gastric body. Additional feeding tube terminates in the distal gastric body and is looped in the stomach. Electronically Signed   By: Julian Hy M.D.   On: 02/12/2020 07:24   DG Abd Portable 1V  Result Date: 02/11/2020 CLINICAL DATA:  Emesis. COVID-19 infection. Patient is currently on ECMO. EXAM: PORTABLE ABDOMEN - 1 VIEW COMPARISON:  02/09/2020; 01/30/2020; chest radiograph-02/11/2020; 02/10/2020 FINDINGS: Redemonstrated mild gaseous distension of multiple loops of large and small bowel. There are two mildly dilated loops of small bowel within the midline of the upper abdomen which demonstrate potential minimal amount of bowel wall thickening. No pneumoperitoneum, pneumatosis or portal venous gas. Limited visualization of lower thorax demonstrates extensive bilateral consolidative opacities.  Enteric tube tips project over the gastric antrum. ECMO cannulation device tip is unchanged in positioning overlying the T12 vertebral body. Post cholecystectomy. Radiopaque structure overlying the lower pelvis is likely external to the patient. No acute osseous abnormalities. Degenerative change of the bilateral hips. IMPRESSION: Similar findings most suggestive of ileus though there are 2 loops of small bowel which demonstrate potential  bowel wall thickening as could be seen in the setting of an enteritis either of infectious, inflammatory or ischemic etiology. No pneumoperitoneum pneumatosis. Electronically Signed   By: Sandi Mariscal M.D.   On: 02/11/2020 09:57   DG Abd Portable 1V  Result Date: 02/09/2020 CLINICAL DATA:  Feeding tube placement. EXAM: PORTABLE ABDOMEN - 1 VIEW COMPARISON:  02/09/2020 FINDINGS: Feeding tube is been advanced into the duodenum with the tip in the second portion of duodenum. Central venous large-bore catheter extending into the distal IVC unchanged. Severe airspace disease bilaterally. No dilated bowel loops. IMPRESSION: Feeding tube has been advanced with the tip in the second portion the duodenum. Electronically Signed   By: Franchot Gallo M.D.   On: 02/09/2020 14:43   DG Abd Portable 1V  Result Date: 01/30/2020 CLINICAL DATA:  Feeding tube placement. EXAM: PORTABLE ABDOMEN - 1 VIEW COMPARISON:  Plain film of the abdomen dated 02/06/2020. FINDINGS: Weighted tip feeding tube appears appropriately positioned in the stomach with tip directed towards the stomach pylorus/duodenal bulb. ECMO cannula in place. IMPRESSION: Weighted tip feeding tube appears appropriately positioned in the stomach with tip directed towards the stomach pylorus/duodenal bulb. Electronically Signed   By: Franki Cabot M.D.   On: 01/30/2020 11:31   VAS [place] UPPER EXTREMITY ARTERIAL DUPLEX  Result Date: 01/29/2020 UPPER EXTREMITY DUPLEX STUDY Indications: Patient complains of discoloration of three digits  on the right              hand. History:     Patient has a history ofCovid positive - acute hypoxic respiratory              failure requiring ECMO.  Performing Technologist: Oda Cogan RDMS, RVT  Examination Guidelines: A complete evaluation includes B-mode imaging, spectral Doppler, color Doppler, and power Doppler as needed of all accessible portions of each vessel. Bilateral testing is considered an integral part of a complete examination. Limited examinations for reoccurring indications may be performed as noted.  Right Doppler Findings: +---------------+----------+---------+--------+--------+ Site           PSV (cm/s)Waveform StenosisComments +---------------+----------+---------+--------+--------+ Subclavian Dist                                    +---------------+----------+---------+--------+--------+ Brachial Dist  103       triphasic                 +---------------+----------+---------+--------+--------+ Radial Prox    53        triphasic                 +---------------+----------+---------+--------+--------+ Radial Mid     55        triphasic                 +---------------+----------+---------+--------+--------+ Radial Dist    72        triphasic                 +---------------+----------+---------+--------+--------+ Ulnar Prox     75        triphasic                 +---------------+----------+---------+--------+--------+ Ulnar Mid      57        triphasic                 +---------------+----------+---------+--------+--------+ Ulnar Dist     83        biphasic                  +---------------+----------+---------+--------+--------+  Palmar Arch              Patent                    +---------------+----------+---------+--------+--------+   Summary:  Right: Patient right upper extremity arterial system and right        Palmar Arch. *See table(s) above for measurements and observations. Electronically signed by Ruta Hinds MD on  01/29/2020 at 5:11:31 PM.    Final    ECHOCARDIOGRAM COMPLETE  Result Date: 01/16/2020    ECHOCARDIOGRAM REPORT   Patient Name:   Alisha Stephenson Date of Exam: 01/16/2020 Medical Rec #:  [id number]       Height:       64.0 in Accession #:    [id number]      Weight:       293.7 lb Date of Birth:  31-Mar-1966        BSA:          2.303 m Patient Age:    48 years        BP:           109/49 mmHg Patient Gender: F               HR:           132 bpm. Exam Location:  Inpatient Procedure: 2D Echo, Cardiac Doppler and Color Doppler Indications:    Acute resp. insufficiency  History:        Patient has prior history of Echocardiogram examinations, most                 recent 01/10/2020. Signs/Symptoms:Shortness of Breath; Risk                 Factors:Diabetes and Obesity. Covid, resp. failure.  Sonographer:    Dustin Flock Referring Phys: [id number] DANIEL R BENSIMHON IMPRESSIONS  1. Left ventricular ejection fraction, by estimation, is >75%. The left ventricle has hyperdynamic function. The left ventricle has no regional wall motion abnormalities. Left ventricular diastolic function could not be evaluated.  2. Right ventricular systolic function is hyperdynamic. The right ventricular size is normal. Tricuspid regurgitation signal is inadequate for assessing PA pressure.  3. The mitral valve is grossly normal. No evidence of mitral valve regurgitation.  4. The aortic valve was not well visualized. Aortic valve regurgitation is not visualized.  5. The inferior vena cava is normal in size with <50% respiratory variability, suggesting right atrial pressure of 8 mmHg. FINDINGS  Left Ventricle: Left ventricular ejection fraction, by estimation, is >75%. The left ventricle has hyperdynamic function. The left ventricle has no regional wall motion abnormalities. The left ventricular internal cavity size was normal in size. There is no left ventricular hypertrophy. Left ventricular diastolic function could not be evaluated. Right  Ventricle: The right ventricular size is normal. No increase in right ventricular wall thickness. Right ventricular systolic function is hyperdynamic. Tricuspid regurgitation signal is inadequate for assessing PA pressure. Left Atrium: Left atrial size was normal in size. Right Atrium: Right atrial size was normal in size. Pericardium: There is no evidence of pericardial effusion. Mitral Valve: The mitral valve is grossly normal. No evidence of mitral valve regurgitation. Tricuspid Valve: The tricuspid valve is not assessed. Tricuspid valve regurgitation is not demonstrated. Aortic Valve: The aortic valve was not well visualized. Aortic valve regurgitation is not visualized. Pulmonic Valve: The pulmonic valve was not assessed. Pulmonic valve regurgitation is not visualized. Aorta: The aortic root was not  well visualized. Venous: The inferior vena cava is normal in size with less than 50% respiratory variability, suggesting right atrial pressure of 8 mmHg. IAS/Shunts: The interatrial septum was not assessed. Sanda Klein MD Electronically signed by Sanda Klein MD Signature Date/Time: 01/16/2020/9:56:05 AM    Final    ECHOCARDIOGRAM LIMITED  Result Date: 02/13/2020    ECHOCARDIOGRAM LIMITED REPORT   Patient Name:   Alisha Stephenson Date of Exam: 02/13/2020 Medical Rec #:  [id number]       Height:       64.0 in Accession #:    [id number]      Weight:       267.9 lb Date of Birth:  1966-05-06        BSA:          2.215 m Patient Age:    49 years        BP:           109/48 mmHg Patient Gender: F               HR:           91 bpm. Exam Location:  Inpatient Procedure: Limited Echo, Limited Color Doppler and Cardiac Doppler Indications:    Aortic Valve Disorder 424.1 / I35.9  History:        Patient has prior history of Echocardiogram examinations, most                 recent 01/16/2020. Risk Factors:Diabetes and Former Smoker. ECMO.  Sonographer:    Vickie Epley RDCS Referring Phys: [id number] DANIEL R BENSIMHON  Sonographer  Comments: Technically difficult study due to poor echo windows. IMPRESSIONS  1. Left ventricular ejection fraction, by estimation, is 65 to 70%. The left ventricle has normal function. The left ventricle has no regional wall motion abnormalities. Left ventricular diastolic function could not be evaluated.  2. Right ventricular systolic function is normal. The right ventricular size is normal. Tricuspid regurgitation signal is inadequate for assessing PA pressure.  3. The mitral valve is normal in structure. No evidence of mitral valve regurgitation. No evidence of mitral stenosis.  4. The aortic valve is normal in structure. Aortic valve regurgitation is not visualized. No aortic stenosis is present.  5. The inferior vena cava is normal in size with greater than 50% respiratory variability, suggesting right atrial pressure of 3 mmHg. FINDINGS  Left Ventricle: Left ventricular ejection fraction, by estimation, is 65 to 70%. The left ventricle has normal function. The left ventricle has no regional wall motion abnormalities. The left ventricular internal cavity size was normal in size. There is  no left ventricular hypertrophy. Right Ventricle: The right ventricular size is normal. Right vetricular wall thickness was not assessed. Right ventricular systolic function is normal. Tricuspid regurgitation signal is inadequate for assessing PA pressure. Left Atrium: Left atrial size was normal in size. Right Atrium: Right atrial size was not well visualized. Pericardium: There is no evidence of pericardial effusion. Mitral Valve: The mitral valve is normal in structure. Normal mobility of the mitral valve leaflets. No evidence of mitral valve stenosis. Tricuspid Valve: The tricuspid valve is normal in structure. Tricuspid valve regurgitation is not demonstrated. No evidence of tricuspid stenosis. Aortic Valve: The aortic valve is normal in structure. Aortic valve regurgitation is not visualized. No aortic stenosis is  present. Pulmonic Valve: The pulmonic valve was not assessed. No evidence of pulmonic stenosis. Aorta: The aortic root is normal in size and structure. Venous: The inferior  vena cava is normal in size with greater than 50% respiratory variability, suggesting right atrial pressure of 3 mmHg. IAS/Shunts: No atrial level shunt detected by color flow Doppler. LEFT VENTRICLE PLAX 2D LVIDd:         5.00 cm LVIDs:         3.10 cm LV PW:         0.90 cm LV IVS:        0.90 cm LVOT diam:     2.10 cm LV SV:         68 LV SV Index:   31 LVOT Area:     3.46 cm  LEFT ATRIUM         Index LA diam:    2.80 cm 1.26 cm/m  AORTIC VALVE LVOT Vmax:   118.00 cm/s LVOT Vmean:  79.800 cm/s LVOT VTI:    0.196 m MITRAL VALVE MV Area (PHT): 4.06 cm    SHUNTS MV Decel Time: 187 msec    Systemic VTI:  0.20 m MV E velocity: 82.70 cm/s  Systemic Diam: 2.10 cm MV A velocity: 56.10 cm/s MV E/A ratio:  1.47 Fransico Him MD Electronically signed by Fransico Him MD Signature Date/Time: 02/13/2020/3:30:58 PM    Final    [place] EKG SITE RITE  Result Date: 01/24/2020 If Site Rite image not attached, placement could not be confirmed due to current cardiac rhythm.  US Abdomen Limited RUQ  Result Date: 01/18/2020 CLINICAL DATA:  Hyperbilirubinemia.  History of cholecystectomy. EXAM: ULTRASOUND ABDOMEN LIMITED RIGHT UPPER QUADRANT COMPARISON:  None. FINDINGS: Gallbladder: Surgically absent. Common bile duct: Diameter: 3 mm Liver: Diffusely echogenic indicating fatty infiltration. No focal mass or lesion is identified within the liver. Portal vein is patent on color Doppler imaging with normal direction of blood flow towards the liver. Other: None. IMPRESSION: 1. No acute findings. 2. No bile duct dilatation. 3. Fatty infiltration of the liver. 4. Status post cholecystectomy. Electronically Signed   By: Franki Cabot M.D.   On: 01/18/2020 16:29    Labs:  CBC: Recent Labs    02/13/20 1611 02/13/20 1723 02/14/20 0249 02/14/20 0406  02/14/20 1600 02/14/20 1603 02/14/20 1951 02/14/20 2357 02/15/20 0359 02/15/20 0407  WBC 13.6*  --  12.5*  --  11.6*  --   --   --   --  9.8  HGB 8.7*   < > 8.5*   < > 8.6*   < > 8.8* 8.5* 8.8* 8.2*  HCT 28.5*   < > 28.2*   < > 28.2*   < > 26.0* 25.0* 26.0* 28.0*  PLT 94*  --  104*  --  94*  --   --   --   --  95*   < > = values in this interval not displayed.    COAGS: Recent Labs    02/03/20 0313 02/03/20 1630 02/04/20 0404 02/04/20 1548 02/05/20 0304 02/05/20 1700 02/05/20 2242 02/06/20 0306 02/13/20 1611 02/14/20 0249 02/14/20 1600 02/15/20 0407  INR 1.1  --  1.1  --  1.1  --  1.1  --   --   --   --   --   APTT 40*   < > 38*   < > 41*   < >  --    < > 41* 38* 38* 38*   < > = values in this interval not displayed.    BMP: Recent Labs    02/13/20 1611 02/13/20 1723 02/14/20 0249 02/14/20 0406 02/14/20  1600 02/14/20 1603 02/14/20 1951 02/14/20 2357 02/15/20 0359 02/15/20 0407  NA 138   < > 136   < > 138   < > 140 139 139 138  K 4.0   < > 3.8   < > 4.1   < > 4.2 3.9 3.8 3.8  CL 101  --  100  --  101  --   --   --   --  103  CO2 25  --  22  --  21*  --   --   --   --  19*  GLUCOSE 176*  --  178*  --  153*  --   --   --   --  186*  BUN 23*  --  31*  --  47*  --   --   --   --  58*  CALCIUM 8.2*  --  8.5*  --  8.7*  --   --   --   --  8.7*  CREATININE 1.30*  --  1.77*  --  2.42*  --   --   --   --  2.93*  GFRNONAA 46*  --  32*  --  22*  --   --   --   --  17*  GFRAA 53*  --  37*  --  25*  --   --   --   --  20*   < > = values in this interval not displayed.    LIVER FUNCTION TESTS: Recent Labs    02/02/20 0330 02/02/20 0330 02/03/20 0313 02/04/20 0404 02/05/20 0304 02/12/20 1130  BILITOT 1.9*  --  1.6* 1.8* 1.6*  --   AST 195*  --  162* 105* 73*  --   ALT 134*  --  165* 158* 151*  --   ALKPHOS 259*  --  252* 243* 264*  --   PROT 5.4*  --  5.7* 5.7* 5.7*  --   ALBUMIN 2.4*   < > 2.3* 2.2* 2.1* 2.5*   < > = values in this interval not displayed.     TUMOR MARKERS: No results for input(s): AFPTM, CEA, CA199, CHROMGRNA in the last 8760 hours.  Assessment and Plan:  COVID-19 pneumonia requiring long-term support including mechanical ventilation, ECMO, CRRT and enteral nutrition: Alisha Stephenson, 54 year old female, was admitted to the hospital 01/16/2020 for respiratory failure secondary to COVID-19 pneumonia. Her hospital course has been complicated by the need for long-term mechanical ventilation, renal replacement therapy and ECMO support. The patient has demonstrated an intolerance to gastric feedings. She is tentatively scheduled for a gastrostomy tube with jejunal extension 02/16/20. Her husband is at the bedside and the procedure was discussed and consent obtained by him.   Risks and benefits of image-guided gastrostomy tube placement were discussed with the patient including, but not limited to the need for a barium enema during the procedure, bleeding, infection, peritonitis and/or damage to adjacent structures.  All of the patient's husband's questions were answered, patient's husband is agreeable to proceed.  Consent signed and in chart.  Thank you for this interesting consult.  I greatly enjoyed meeting Alisha Stephenson and look forward to participating in their care.  A copy of this report was sent to the requesting provider on this date.  Electronically Signed: Soyla Dryer, AGACNP-BC [phone] 02/15/2020, 8:31 AM   I spent a total of 40 Minutes    in face to face in clinical consultation, greater than 50%  of which was counseling/coordinating care for gastrostomy tube with jejunal extension.

## 2020-02-15 NOTE — Progress Notes (Signed)
Patient ID: Alisha Stephenson, female   DOB: Jun 24, 1965, 54 y.o.   MRN: [id number]    Advanced Heart Failure Rounding Note   Subjective:    - 8/2 COVID + test - 8/9 Cannulated for VV ECMO - 8/13 with several areas of intracranial hemorrhage. Bival stopped.  - 8/14 CT no change in [place]. Increased edema - 8/16 Extubated - 8/16 Head CT stable bleed - 8/22 CVVHD started - 8/24 reintubated - 8/24 circuit changed - 8/30 trach - 9/7  Switched to I HD  Remains awake on vent. Following commands but not able to move much. Due for iHD.   On NE 3  Wound Care has seen for sacral decub   Echo EF  65-70% no AI   ECMO   Flow 4.6 L RPM 4000 Sweep8.5  Labs:  7.34/39/58/88% Fio2 40% TV 120 Hgb8.2 PLT 104k -> 95k LDH 489 -> 401 -> 376 -> 326 Lactic acid1.3   Objective:   Weight Range:  Vital Signs:   Temp:  [98.5 F (36.9 C)-99.1 F (37.3 C)] 98.5 F (36.9 C) (09/09 0400) Pulse Rate:  [78-101] 84 (09/09 0725) Resp:  [15-26] 15 (09/09 0725) BP: (100-130)/(47-60) 117/49 (09/09 0725) SpO2:  [76 %-98 %] 91 % (09/09 0725) Arterial Line BP: (66-148)/(43-60) 114/46 (09/09 0700) FiO2 (%):  [40 %] 40 % (09/09 0725) Weight:  [121.2 kg] 121.2 kg (09/09 0500) Last BM Date: 02/14/20  Weight change: Filed Weights   02/13/20 1015 02/14/20 0500 02/15/20 0500  Weight: 121.5 kg 119.6 kg 121.2 kg    Intake/Output:   Intake/Output Summary (Last 24 hours) at 02/15/2020 0739 Last data filed at 02/15/2020 0700 Gross per 24 hour  Intake 1055.75 ml  Output 400 ml  Net 655.75 ml     Physical Exam: General:  Well appearing. No resp difficulty HEENT: normal Neck: supple. no JVD. Carotids 2+ bilat; no bruits. No lymphadenopathy or thryomegaly appreciated. Cor: PMI nondisplaced. Regular rate & rhythm. No rubs, gallops or murmurs. Lungs: clear Abdomen: soft, nontender, nondistended. No hepatosplenomegaly. No bruits or masses. Good bowel sounds. Extremities: no cyanosis, clubbing, rash,  edema Neuro: alert & orientedx3, cranial nerves grossly intact. moves all 4 extremities w/o difficulty. Affect pleasant    Telemetry: Sinus 80-90s Personally reviewed   Labs: Basic Metabolic Panel: Recent Labs  Lab 02/09/20 1604 02/09/20 1611 02/10/20 0322 02/10/20 0353 02/11/20 0322 02/11/20 0333 02/12/20 0405 02/12/20 0516 02/12/20 1130 02/12/20 1625 02/13/20 0314 02/13/20 0315 02/13/20 1611 02/13/20 1723 02/14/20 0249 02/14/20 0406 02/14/20 1600 02/14/20 1600 02/14/20 1603 02/14/20 1951 02/14/20 2357 02/15/20 0359 02/15/20 0407  NA 136   < > 135   < > 138   < > 137   < > 136   < > 137   < > 138   < > 136   < > 138   < > 139 140 139 139 138  K 4.3   < > 4.2   < > 4.0   < > 4.0   < > 4.0   < > 3.8   < > 4.0   < > 3.8   < > 4.1   < > 4.1 4.2 3.9 3.8 3.8  CL 102   < > 102   < > 101   < > 102   < > 101   < > 100  --  101  --  100  --  101  --   --   --   --   --  103  CO2 22   < > 22   < > 23   < > 22   < > 22   < > 23  --  25  --  22  --  21*  --   --   --   --   --  19*  GLUCOSE 162*   < > 138*   < > 172*   < > 126*   < > 145*   < > 194*  --  176*  --  178*  --  153*  --   --   --   --   --  186*  BUN 30*   < > 25*   < > 28*   < > 14   < > 16   < > 28*  --  23*  --  31*  --  47*  --   --   --   --   --  58*  CREATININE 1.02*   < > 1.02*   < > 1.06*   < > 0.91   < > 0.96   < > 1.32*  --  1.30*  --  1.77*  --  2.42*  --   --   --   --   --  2.93*  CALCIUM 8.4*   < > 8.5*   < > 8.5*   < > 8.5*   < > 8.4*   < > 8.6*   < > 8.2*   < > 8.5*  --  8.7*  --   --   --   --   --  8.7*  MG 2.7*  --  2.7*  --  2.7*  --  2.7*  --   --   --  2.9*  --   --   --   --   --   --   --   --   --   --   --   --   PHOS 4.4   < > 2.5   < > 2.7   < > 2.5  --  3.6  --  3.7  --   --   --  3.6  --   --   --   --   --   --   --  5.2*   < > = values in this interval not displayed.    Liver Function Tests: Recent Labs  Lab 02/12/20 1130  ALBUMIN 2.5*   No results for input(s): LIPASE, AMYLASE  in the last 168 hours. No results for input(s): AMMONIA in the last 168 hours.  CBC: Recent Labs  Lab 02/13/20 0314 02/13/20 0315 02/13/20 1611 02/13/20 1723 02/14/20 0249 02/14/20 0406 02/14/20 1600 02/14/20 1600 02/14/20 1603 02/14/20 1951 02/14/20 2357 02/15/20 0359 02/15/20 0407  WBC 13.8*  --  13.6*  --  12.5*  --  11.6*  --   --   --   --   --  9.8  HGB 7.5*   < > 8.7*   < > 8.5*   < > 8.6*   < > 9.5* 8.8* 8.5* 8.8* 8.2*  HCT 25.6*   < > 28.5*   < > 28.2*   < > 28.2*   < > 28.0* 26.0* 25.0* 26.0* 28.0*  MCV 111.8*  --  110.5*  --  109.3*  --  110.6*  --   --   --   --   --  111.1*  PLT 102*  --  94*  --  104*  --  94*  --   --   --   --   --  95*   < > = values in this interval not displayed.    Cardiac Enzymes: No results for input(s): CKTOTAL, CKMB, CKMBINDEX, TROPONINI in the last 168 hours.  BNP: BNP (last 3 results) No results for input(s): BNP in the last 8760 hours.  ProBNP (last 3 results) No results for input(s): PROBNP in the last 8760 hours.    Other results:  Imaging: DG CHEST PORT 1 VIEW  Result Date: 02/14/2020 CLINICAL DATA:  ECMO EXAM: PORTABLE CHEST 1 VIEW COMPARISON:  Yesterday FINDINGS: Endotracheal tube tip is at the clavicular heads. Bilateral central line in unchanged position. ECMO catheter in stable position. The enteric tube at least reaches the mid stomach. Airless lungs and obscured heart size. No visible air leak. IMPRESSION: 1. Stable hardware positioning. 2. Airless lungs. Electronically Signed   By: Monte Fantasia M.D.   On: 02/14/2020 07:41   ECHOCARDIOGRAM LIMITED  Result Date: 02/13/2020    ECHOCARDIOGRAM LIMITED REPORT   Patient Name:   Alisha Stephenson Date of Exam: 02/13/2020 Medical Rec #:  [id number]       Height:       64.0 in Accession #:    [id number]      Weight:       267.9 lb Date of Birth:  February 07, 1966        BSA:          2.215 m Patient Age:    68 years        BP:           109/48 mmHg Patient Gender: F               HR:            91 bpm. Exam Location:  Inpatient Procedure: Limited Echo, Limited Color Doppler and Cardiac Doppler Indications:    Aortic Valve Disorder 424.1 / I35.9  History:        Patient has prior history of Echocardiogram examinations, most                 recent 01/16/2020. Risk Factors:Diabetes and Former Smoker. ECMO.  Sonographer:    Vickie Epley RDCS Referring Phys: [id number] Jennett Tarbell R Demarrius Guerrero  Sonographer Comments: Technically difficult study due to poor echo windows. IMPRESSIONS  1. Left ventricular ejection fraction, by estimation, is 65 to 70%. The left ventricle has normal function. The left ventricle has no regional wall motion abnormalities. Left ventricular diastolic function could not be evaluated.  2. Right ventricular systolic function is normal. The right ventricular size is normal. Tricuspid regurgitation signal is inadequate for assessing PA pressure.  3. The mitral valve is normal in structure. No evidence of mitral valve regurgitation. No evidence of mitral stenosis.  4. The aortic valve is normal in structure. Aortic valve regurgitation is not visualized. No aortic stenosis is present.  5. The inferior vena cava is normal in size with greater than 50% respiratory variability, suggesting right atrial pressure of 3 mmHg. FINDINGS  Left Ventricle: Left ventricular ejection fraction, by estimation, is 65 to 70%. The left ventricle has normal function. The left ventricle has no regional wall motion abnormalities. The left ventricular internal cavity size was normal in size. There is  no left ventricular hypertrophy. Right Ventricle: The right ventricular size is normal. Right vetricular wall thickness was not assessed.  Right ventricular systolic function is normal. Tricuspid regurgitation signal is inadequate for assessing PA pressure. Left Atrium: Left atrial size was normal in size. Right Atrium: Right atrial size was not well visualized. Pericardium: There is no evidence of pericardial effusion. Mitral  Valve: The mitral valve is normal in structure. Normal mobility of the mitral valve leaflets. No evidence of mitral valve stenosis. Tricuspid Valve: The tricuspid valve is normal in structure. Tricuspid valve regurgitation is not demonstrated. No evidence of tricuspid stenosis. Aortic Valve: The aortic valve is normal in structure. Aortic valve regurgitation is not visualized. No aortic stenosis is present. Pulmonic Valve: The pulmonic valve was not assessed. No evidence of pulmonic stenosis. Aorta: The aortic root is normal in size and structure. Venous: The inferior vena cava is normal in size with greater than 50% respiratory variability, suggesting right atrial pressure of 3 mmHg. IAS/Shunts: No atrial level shunt detected by color flow Doppler. LEFT VENTRICLE PLAX 2D LVIDd:         5.00 cm LVIDs:         3.10 cm LV PW:         0.90 cm LV IVS:        0.90 cm LVOT diam:     2.10 cm LV SV:         68 LV SV Index:   31 LVOT Area:     3.46 cm  LEFT ATRIUM         Index LA diam:    2.80 cm 1.26 cm/m  AORTIC VALVE LVOT Vmax:   118.00 cm/s LVOT Vmean:  79.800 cm/s LVOT VTI:    0.196 m MITRAL VALVE MV Area (PHT): 4.06 cm    SHUNTS MV Decel Time: 187 msec    Systemic VTI:  0.20 m MV E velocity: 82.70 cm/s  Systemic Diam: 2.10 cm MV A velocity: 56.10 cm/s MV E/A ratio:  1.47 Fransico Him MD Electronically signed by Fransico Him MD Signature Date/Time: 02/13/2020/3:30:58 PM    Final      Medications:     Scheduled Medications:  amiodarone  200 mg Per Tube Daily   vitamin C  500 mg Per Tube Daily   chlorhexidine gluconate (MEDLINE KIT)  15 mL Mouth Rinse BID   Chlorhexidine Gluconate Cloth  6 each Topical Q0600   clonazePAM  1 mg Per Tube Q6H   clotrimazole  1 Applicatorful Vaginal QHS   docusate  100 mg Per Tube BID   feeding supplement (PROSource TF)  45 mL Per Tube QID   fentaNYL (SUBLIMAZE) injection  50 mcg Intravenous Once   Gerhardt's butt cream   Topical BID   linagliptin  5 mg Per Tube  Daily   mouth rinse  15 mL Mouth Rinse 10 times per day   melatonin  9 mg Per Tube QHS   midodrine  10 mg Per Tube TID   multivitamin  1 tablet Per Tube QHS   oxyCODONE  10 mg Per Tube Q6H   pantoprazole (PROTONIX) IV  40 mg Intravenous BID   polyethylene glycol  17 g Per Tube Daily   QUEtiapine  50 mg Per Tube QHS   sennosides  5 mL Per Tube BID   zinc sulfate  220 mg Per Tube Daily    Infusions:  sodium chloride 500 mL (02/11/20 2215)   sodium chloride 10 mL/hr at 02/15/20 0700   sodium chloride     sodium chloride     albumin human 12.5 g (02/12/20 2231)  albumin human Stopped (02/06/20 2000)   feeding supplement (PIVOT 1.5 CAL) 1,000 mL (02/14/20 2342)   heparin 500 Units/hr (02/15/20 0700)   insulin 1.9 mL/hr at 02/15/20 0700   norepinephrine (LEVOPHED) Adult infusion 3 mcg/min (02/15/20 0700)   phenylephrine (NEO-SYNEPHRINE) Adult infusion Stopped (02/13/20 0155)    PRN Medications: sodium chloride, sodium chloride, sodium chloride, sodium chloride, acetaminophen (TYLENOL) oral liquid 160 mg/5 mL, albumin human, albuterol, alteplase, alum & mag hydroxide-simeth, dextrose, fentaNYL (SUBLIMAZE) injection, guaiFENesin-dextromethorphan, heparin, hydrALAZINE, HYDROmorphone (DILAUDID) injection, hydrOXYzine, lidocaine (PF), lidocaine-prilocaine, ondansetron **OR** ondansetron (ZOFRAN) IV, pentafluoroprop-tetrafluoroeth   Assessment/Plan:   1. Acute hypoxic/hypercapneic respiratory failure in setting of severe COVID PNA/ARDS -> VV ECMO - admit 8/2 - intubation 8/3 - has received actmera (compelted 8/2), remdesivir (completed 8/6) and steroids - Cannulated for VV ECMO on 8/9 - Extubated 8/16. Reintubated 8/24 - s/p trach 8/30. - Circuit changed 8/24 due to concern for hemolysis and worsening oxygenation. - CVVHD started 8/22 for volume removal and uremia. Remains anuric.CXR worse today. Unable to increase tidal volumes. Will switch back to CVVHD. -  Remains on low-dose heparin. No bleeding. Discussed dosing with PharmD personally. - Off IV sedation. Continue melatonin and seroquel for sleep at night  - On low-dose NE and midodrine 10 tid in setting of wide pulse pressure. Concern for aortic valve insufficiency with possible previous vegetation. Echo 9/7 no AI  2. Enterococcus sepsis - Finished high-dose zosyn on 9/7 per ID - Eraxis added on 8/26 with yeast in BAL 8/24 (ended 9/2)  - Cultures repeated with increasing pressor demands - F/u cx 9/7 NGTD  3. Intracranial hemorrhage - ? Septic emboli - repeat head CT on 8/14 with stable bleeds but increased edema - neurology has seen. Suspect significant long-term injury sustained. Will follow commands. Appears to have dense LUE weakness and possibly LLE - repeat head CT stable 8/16 - tolerating low-dose heparin  4. Thrombocytopenia - PLTs ~95k - Continue to follow  5. Morbid obesity - Body mass index is Body mass index is 45.86 kg/m.  6. Poorly controlled DM2 - HgBA1c 10.7 - On insulin gtt  7. PAF - intermittent episodes. Last 8/26 - Now on po. Quiescent  8. AKI/azotemia - CVVHD started on 8/22 - Down 70 pounds.  - transitioned to [facility] on 9/7 - Remains anuric.CXR worse today. Unable to increase tidal volumes. Will switch back to CVVHD.  9. Emesis - Cor-trak out. TFs at 20 due to emesis  - Needs PEG-J. IR consulted  10. Sacral decub  - Wound care following  CRITICAL CARE Performed by: Glori Bickers  Total critical care time: 35 minutes  Critical care time was exclusive of separately billable procedures and treating other patients.  Critical care was necessary to treat or prevent imminent or life-threatening deterioration.  Critical care was time spent personally by me (independent of midlevel providers or residents) on the following activities: development of treatment plan with patient and/or surrogate as well as nursing, discussions with consultants,  evaluation of patient's response to treatment, examination of patient, obtaining history from patient or surrogate, ordering and performing treatments and interventions, ordering and review of laboratory studies, ordering and review of radiographic studies, pulse oximetry and re-evaluation of patient's condition.   Length of Stay: [place]  MD 02/15/2020, 7:39 AM  Advanced Heart Failure Team Pager [phone] (M-F; [place])  Please contact [facility] for night-coverage after hours (4p -7a ) and weekends on [email or web address]

## 2020-02-15 NOTE — Progress Notes (Signed)
Brief Nutrition Follow-up:  RD discussed pt with RN and Clinical Team regarding feeding plan for pt. Some confusion regarding which type of feeding tube was to be placed.  This RD spoke with Roselyn Reef NP with Radiology regarding plan for feeding tube placement. RD confirmed with Radiology that plan is for patient to receive G-J feeding tube (not G-tube) tomorrow. Pt has not demonstrated tolerance of gastric feedings and therefore plan for G-J tube is most appropriate at this time  Interventions:   Continue TF at current rate; pt will be NPO at midnight for procedure  RD to follow-up post G-J tube placement for re-initiation and titration of TF to goal rate  Kerman Passey MS, RDN, LDN, CNSC Registered Dietitian III Clinical Nutrition RD Pager and On-Call Pager Number Located in [place]

## 2020-02-15 NOTE — Procedures (Signed)
Extracorporeal support note   ECLS support day: 31 (cannulated 01/11/2020) Indication: Covid 19 pneumonia/ARDS  Configuration: VV  Drainage cannula: right IJ [place]  Return cannula: same  Pump speed: 4000 Pump flow: 4.65 Pump used: Centrimag  Oxygenator: Quadrox O2 blender: 100% Sweep gas: 8.5  ABG    Component Value Date/Time   PHART 7.343 (L) 02/15/2020 0359   PCO2ART 39.1 02/15/2020 0359   PO2ART 58 (L) 02/15/2020 0359   HCO3 21.3 02/15/2020 0359   TCO2 22 02/15/2020 0359   ACIDBASEDEF 4.0 (H) 02/15/2020 0359   O2SAT 88.0 02/15/2020 0359    Circuit check: no visible clot Anticoagulant: Continue heparin Anticoagulation targets: aptt 40-60  Changes in support: Wean sweep.   Anticipated goals/duration of support: bridge to recovery.  Candee Furbish, MD 02/15/20 7:14 AM [facility]

## 2020-02-15 NOTE — Progress Notes (Signed)
ANTICOAGULATION CONSULT NOTE - [place] for Heparin Indication: ECMO  Allergies  Allergen Reactions  . Sulfa Antibiotics Rash and Other (See Comments)    Patient Measurements: Height: 5\' 4"  (162.6 cm) Weight: 121.2 kg (267 lb 3.2 oz) IBW/kg (Calculated) : 54.7 Heparin Dosing Weight: 87 kg  Vital Signs: Temp: 98.5 F (36.9 C) (09/09 0400) Temp Source: Axillary (09/09 0400) BP: 117/49 (09/09 0725) Pulse Rate: 84 (09/09 0725)  Labs: Recent Labs    02/13/20 0314 02/13/20 0315 02/14/20 0249 02/14/20 0406 02/14/20 1600 02/14/20 1603 02/14/20 2357 02/14/20 2357 02/15/20 0359 02/15/20 0407 02/15/20 0408  HGB 7.5*   < > 8.5*   < > 8.6*   < > 8.5*   < > 8.8* 8.2*  --   HCT 25.6*   < > 28.2*   < > 28.2*   < > 25.0*  --  26.0* 28.0*  --   PLT 102*   < > 104*  --  94*  --   --   --   --  95*  --   APTT 38*   < > 38*  --  38*  --   --   --   --  38*  --   HEPARINUNFRC <0.10*  --  <0.10*  --   --   --   --   --   --   --  <0.10*  CREATININE 1.32*   < > 1.77*  --  2.42*  --   --   --   --  2.93*  --    < > = values in this interval not displayed.    Estimated Creatinine Clearance: 27.8 mL/min (A) (by C-G formula based on SCr of 2.93 mg/dL (H)).   Medical History: Past Medical History:  Diagnosis Date  . Asthma   . Diabetes mellitus without complication ([place])   . Diverticulitis   . Gallstones   . IBS (irritable bowel syndrome)   . NAFLD (nonalcoholic fatty liver disease)    CT scan 2015  . Ovarian cyst     Assessment: 54 yo female who is COVID+ on VV ECMO. Patient was started on bivalirudin but was found to have multiple ICH on 8/13 head CT. Bivalirudin was stopped at this time and pt has been off anticoagulation. Her ECMO circuit was changed 8/24, and pharmacy asked to start low-fixed rate heparin.  Heparin infusion was held last week due to bleeding from trach site. Hgb stable at 8.2, plt 95, fibrinogen 464, LDH 326. ECMO circuit is stable  - mild clots in circuit corners - s/p circuit change   Heparin at 500 units/hr with plans to not titrate for now. Heparin level remains undetectable, aPTT 38.  Goal of Therapy:  Heparin level 0.2-0.5 units/ml - currently not titrating to goal per discussion with ECMO team Monitor platelets by anticoagulation protocol: Yes   Plan:  Continue Heparin infusion at 500 units/hr  Monitor daily CBC, LDH, fibrinogen, s/sx of bleeding  Antonietta Jewel, PharmD, [employer] Pharmacist  Phone: [phone] 02/15/2020 7:41 AM  Please check AMION for all [place] phone numbers After 10:00 PM, call [place] [phone]

## 2020-02-15 NOTE — Progress Notes (Signed)
[facility] Progress Note    Assessment/ Plan:   1.  Anuric AKI due to shock, sepsis causing acute tubular necrosis:CRRT on 8/22- 02/12/20. IHD 02/13/20.  Planned for 9/9 but will go back on CRRT d/t fluid overload.  All 4K, no heparin   2.Shock due to sepsis, Covid infection, blood cultures positive for Enterococcus bacteremia:s/p Zosyn (ended 9/7), cultures repeated 9/7.  TTE 9/7 no AI, done for concern of wide pulse pressure  3.Acute hypoxic and hypercapnic respiratory failure, ARDS due to COVID-19 pneumonia: Currently on VV ECMO for refractory hypoxemia.  3.Hypernatremia, hypervolemia: relative free water deficit. Improved with CRRT.  4.Acute toxic metabolic encephalopathy:Multifactorial, improving  5.Anemia, thrombocytopenia: With hemolysis likely and acute illness contributing. Transfusions per primary team.Seen by hematologist.  6.Intracranial hemorrhage: With left-sided weakness  7.  Diabetes per primary  8.  Hypophosphatemia improved  Subjective:    Backslid with respiratory status. Complete opacification of both lungs on CXR (personally reviewed).  Will place back on CRRT.   Objective:   BP (!) 117/49   Pulse 84   Temp 98.5 F (36.9 C) (Axillary)   Resp 15   Ht 5' 4"  (1.626 m)   Wt 121.2 kg   SpO2 91%   BMI 45.86 kg/m   Intake/Output Summary (Last 24 hours) at 02/15/2020 0848 Last data filed at 02/15/2020 0700 Gross per 24 hour  Intake 931.97 ml  Output 400 ml  Net 531.97 ml   Weight change: -0.3 kg  Physical Exam: Gen: critically ill, lying in bed NECK: R IJ ECMO access CVS: RRR Resp: mech bilaterally Abd: obese Ext: 1+ edema bilaterally ACCESS: L subclavian nontunneled HD catheter  Imaging: DG CHEST PORT 1 VIEW  Result Date: 02/15/2020 CLINICAL DATA:  Tracheostomy. ECMO. Respiratory failure. Recent COVID. EXAM: PORTABLE CHEST 1 VIEW COMPARISON:  02/14/2020. FINDINGS: Tracheostomy tube, NG tube, right PICC line, ECMO device  in stable position. Left subclavian line stable position with tip over left subclavian vein. Complete consolidation of both lungs again noted. Heart is obscured. Prior cervical fusion. IMPRESSION: 1.  Lines and tubes stable position. 2. Complete consolidation of both lungs again noted. No interim change. Electronically Signed   By: Marcello Moores  Register   On: 02/15/2020 07:42   DG CHEST PORT 1 VIEW  Result Date: 02/14/2020 CLINICAL DATA:  ECMO EXAM: PORTABLE CHEST 1 VIEW COMPARISON:  Yesterday FINDINGS: Endotracheal tube tip is at the clavicular heads. Bilateral central line in unchanged position. ECMO catheter in stable position. The enteric tube at least reaches the mid stomach. Airless lungs and obscured heart size. No visible air leak. IMPRESSION: 1. Stable hardware positioning. 2. Airless lungs. Electronically Signed   By: Monte Fantasia M.D.   On: 02/14/2020 07:41   ECHOCARDIOGRAM LIMITED  Result Date: 02/13/2020    ECHOCARDIOGRAM LIMITED REPORT   Patient Name:   [facility] Date of Exam: 02/13/2020 Medical Rec #:  [id number]       Height:       64.0 in Accession #:    [id number]      Weight:       267.9 lb Date of Birth:  Nov 02, 1965        BSA:          2.215 m Patient Age:    54 years        BP:           109/48 mmHg Patient Gender: F  HR:           91 bpm. Exam Location:  Inpatient Procedure: Limited Echo, Limited Color Doppler and Cardiac Doppler Indications:    Aortic Valve Disorder 424.1 / I35.9  History:        Patient has prior history of Echocardiogram examinations, most                 recent 01/16/2020. Risk Factors:Diabetes and Former Smoker. ECMO.  Sonographer:    Vickie Epley RDCS Referring Phys: [id number] DANIEL R BENSIMHON  Sonographer Comments: Technically difficult study due to poor echo windows. IMPRESSIONS  1. Left ventricular ejection fraction, by estimation, is 65 to 70%. The left ventricle has normal function. The left ventricle has no regional wall motion abnormalities. Left  ventricular diastolic function could not be evaluated.  2. Right ventricular systolic function is normal. The right ventricular size is normal. Tricuspid regurgitation signal is inadequate for assessing PA pressure.  3. The mitral valve is normal in structure. No evidence of mitral valve regurgitation. No evidence of mitral stenosis.  4. The aortic valve is normal in structure. Aortic valve regurgitation is not visualized. No aortic stenosis is present.  5. The inferior vena cava is normal in size with greater than 50% respiratory variability, suggesting right atrial pressure of 3 mmHg. FINDINGS  Left Ventricle: Left ventricular ejection fraction, by estimation, is 65 to 70%. The left ventricle has normal function. The left ventricle has no regional wall motion abnormalities. The left ventricular internal cavity size was normal in size. There is  no left ventricular hypertrophy. Right Ventricle: The right ventricular size is normal. Right vetricular wall thickness was not assessed. Right ventricular systolic function is normal. Tricuspid regurgitation signal is inadequate for assessing PA pressure. Left Atrium: Left atrial size was normal in size. Right Atrium: Right atrial size was not well visualized. Pericardium: There is no evidence of pericardial effusion. Mitral Valve: The mitral valve is normal in structure. Normal mobility of the mitral valve leaflets. No evidence of mitral valve stenosis. Tricuspid Valve: The tricuspid valve is normal in structure. Tricuspid valve regurgitation is not demonstrated. No evidence of tricuspid stenosis. Aortic Valve: The aortic valve is normal in structure. Aortic valve regurgitation is not visualized. No aortic stenosis is present. Pulmonic Valve: The pulmonic valve was not assessed. No evidence of pulmonic stenosis. Aorta: The aortic root is normal in size and structure. Venous: The inferior vena cava is normal in size with greater than 50% respiratory variability, suggesting  right atrial pressure of 3 mmHg. IAS/Shunts: No atrial level shunt detected by color flow Doppler. LEFT VENTRICLE PLAX 2D LVIDd:         5.00 cm LVIDs:         3.10 cm LV PW:         0.90 cm LV IVS:        0.90 cm LVOT diam:     2.10 cm LV SV:         68 LV SV Index:   31 LVOT Area:     3.46 cm  LEFT ATRIUM         Index LA diam:    2.80 cm 1.26 cm/m  AORTIC VALVE LVOT Vmax:   118.00 cm/s LVOT Vmean:  79.800 cm/s LVOT VTI:    0.196 m MITRAL VALVE MV Area (PHT): 4.06 cm    SHUNTS MV Decel Time: 187 msec    Systemic VTI:  0.20 m MV E velocity: 82.70 cm/s  Systemic  Diam: 2.10 cm MV A velocity: 56.10 cm/s MV E/A ratio:  1.47 Fransico Him MD Electronically signed by Fransico Him MD Signature Date/Time: 02/13/2020/3:30:58 PM    Final     Labs: BMET Recent Labs  Lab 02/10/20 0322 02/10/20 0353 02/11/20 0322 02/11/20 9417 02/12/20 0405 02/12/20 0516 02/12/20 1130 02/12/20 1130 02/12/20 1625 02/12/20 1831 02/13/20 0314 02/13/20 0315 02/13/20 1611 02/13/20 1723 02/14/20 0249 02/14/20 0249 02/14/20 0406 02/14/20 1600 02/14/20 1603 02/14/20 1951 02/14/20 2357 02/15/20 0359 02/15/20 0407  NA 135   < > 138   < > 137   < > 136   < > 137  137   < > 137   < > 138   < > 136   < > 138 138 139 140 139 139 138  K 4.2   < > 4.0   < > 4.0   < > 4.0   < > 3.9  4.2   < > 3.8   < > 4.0   < > 3.8   < > 4.0 4.1 4.1 4.2 3.9 3.8 3.8  CL 102   < > 101   < > 102   < > 101  --  101  --  100  --  101  --  100  --   --  101  --   --   --   --  103  CO2 22   < > 23   < > 22   < > 22  --  24  --  23  --  25  --  22  --   --  21*  --   --   --   --  19*  GLUCOSE 138*   < > 172*   < > 126*   < > 145*  --  152*  --  194*  --  176*  --  178*  --   --  153*  --   --   --   --  186*  BUN 25*   < > 28*   < > 14   < > 16  --  15  --  28*  --  23*  --  31*  --   --  47*  --   --   --   --  58*  CREATININE 1.02*   < > 1.06*   < > 0.91   < > 0.96  --  0.92  --  1.32*  --  1.30*  --  1.77*  --   --  2.42*  --   --   --   --   2.93*  CALCIUM 8.5*   < > 8.5*   < > 8.5*   < > 8.4*  --  8.3*  --  8.6*  --  8.2*  --  8.5*  --   --  8.7*  --   --   --   --  8.7*  PHOS 2.5  --  2.7  --  2.5  --  3.6  --   --   --  3.7  --   --   --  3.6  --   --   --   --   --   --   --  5.2*   < > = values in this interval not displayed.   CBC Recent Labs  Lab 02/13/20 1611 02/13/20 1723 02/14/20 0249 02/14/20 0406 02/14/20 1600 02/14/20 1603  02/14/20 1951 02/14/20 2357 02/15/20 0359 02/15/20 0407  WBC 13.6*  --  12.5*  --  11.6*  --   --   --   --  9.8  HGB 8.7*   < > 8.5*   < > 8.6*   < > 8.8* 8.5* 8.8* 8.2*  HCT 28.5*   < > 28.2*   < > 28.2*   < > 26.0* 25.0* 26.0* 28.0*  MCV 110.5*  --  109.3*  --  110.6*  --   --   --   --  111.1*  PLT 94*  --  104*  --  94*  --   --   --   --  95*   < > = values in this interval not displayed.    Medications:    . amiodarone  200 mg Per Tube Daily  . vitamin C  500 mg Per Tube Daily  . chlorhexidine gluconate (MEDLINE KIT)  15 mL Mouth Rinse BID  . Chlorhexidine Gluconate Cloth  6 each Topical Q0600  . clonazePAM  1 mg Per Tube Q6H  . clotrimazole  1 Applicatorful Vaginal QHS  . docusate  100 mg Per Tube BID  . feeding supplement (PROSource TF)  45 mL Per Tube QID  . fentaNYL (SUBLIMAZE) injection  50 mcg Intravenous Once  . Gerhardt's butt cream   Topical BID  . linagliptin  5 mg Per Tube Daily  . mouth rinse  15 mL Mouth Rinse 10 times per day  . melatonin  9 mg Per Tube QHS  . midodrine  10 mg Per Tube TID  . multivitamin  1 tablet Per Tube QHS  . oxyCODONE  10 mg Per Tube Q6H  . pantoprazole (PROTONIX) IV  40 mg Intravenous BID  . polyethylene glycol  17 g Per Tube Daily  . QUEtiapine  50 mg Per Tube QHS  . sennosides  5 mL Per Tube BID  . zinc sulfate  220 mg Per Tube Daily      Madelon Lips MD 02/15/2020, 8:48 AM

## 2020-02-15 NOTE — Progress Notes (Addendum)
Physical Therapy Treatment Patient Details Name: Alisha [facility]son MRN: [id number] DOB: [birth year] Today's Date: 02/15/2020    History of Present Illness 54 yo admitted 8/2 with hypoxemia due to Covid 19 PNA. 8/3 intubated. 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 CRRT 8/22-9/6, restarted 9/9. Reintubated 8/24 with ECMO circuit changed, 8/30 trach. PMHx: DM, diverticulitis, Asthma    PT Comments    Pt with eyes open and limited responses to questions and commands today. Pt with active right grip on command and active elbow flexion in standing with very limited range. 1/5 plantarflexion right foot today with heel cord stretch but no other active bil LE movement noted. Pt with PROm extremities performed in supine then pt tilted to 35 degrees for 10 min and tolerated well with pt remaining in tilt with RN present and spouse end of session. Pt with decreased RUE movement and interraction today and spouse and RN report consistent with the last 3 days. Pt with shoes on for tilt with improved bil LE positioning. Will continue to work toward improved strength and mobility.   HR 90 SpO2 95% on pressure control FiO2 40% 147/63 (84)    Follow Up Recommendations  LTACH;Supervision/Assistance - 24 hour     Equipment Recommendations  Other (comment) (TBD)    Recommendations for Other Services       Precautions / Restrictions Precautions Precautions: Other (comment) Precaution Comments: ECMO with RIJ access anchors at right shoulder and chest, right necrotic thumb and index finger, bruised/necrotic left hand, trach, vent, flexiseal, CRRT    Mobility  Bed Mobility Overal bed mobility: Needs Assistance             General bed mobility comments: total assist of bed to tilt Pt tolerating tilt to 35 degrees 10 min and maintained with RN after session Transfers                    Ambulation/Gait                 Stairs              Wheelchair Mobility    Modified Rankin (Stroke Patients Only)       Balance                                            Cognition Arousal/Alertness: Awake/alert Behavior During Therapy: Flat affect Overall Cognitive Status: Difficult to assess Area of Impairment: Following commands                       Following Commands: Follows one step commands with increased time;Follows one step commands inconsistently       General Comments: pt with expression in eye movement, limited head and mouth movement today. very inconsistent command following only of right hand and ankle today      Exercises General Exercises - Upper Extremity Shoulder Flexion: PROM;Both;10 reps;Supine Elbow Flexion: PROM;Both;10 reps;Supine Wrist Flexion: PROM;Both;Standing;10 reps General Exercises - Lower Extremity Ankle Circles/Pumps: PROM;Both;Supine;10 reps Heel Slides: PROM;Both;Supine;10 reps Hip ABduction/ADduction: PROM;Both;Supine;10 reps    General Comments        Pertinent Vitals/Pain Pain Assessment: Faces Faces Pain Scale: Hurts little more Pain Location: pt with grimace to right ankle and knee ROM Pain Descriptors / Indicators: Grimacing Pain Intervention(s): Limited activity within patient's tolerance;Monitored during session;Repositioned  Home Living                      Prior Function            PT Goals (current goals can now be found in the care plan section) Progress towards PT goals: Not progressing toward goals - comment    Frequency    Min 3X/week      PT Plan Current plan remains appropriate    Co-evaluation              AM-PAC PT "6 Clicks" Mobility   Outcome Measure  Help needed turning from your back to your side while in a flat bed without using bedrails?: Total Help needed moving from lying on your back to sitting on the side of a flat bed without using bedrails?: Total Help needed moving to and  from a bed to a chair (including a wheelchair)?: Total Help needed standing up from a chair using your arms (e.g., wheelchair or bedside chair)?: Total Help needed to walk in hospital room?: Total Help needed climbing 3-5 steps with a railing? : Total 6 Click Score: 6    End of Session   Activity Tolerance: Patient tolerated treatment well Patient left: in bed;with nursing/sitter in room;with family/visitor present Nurse Communication: Mobility status;Need for lift equipment PT Visit Diagnosis: Other abnormalities of gait and mobility (R26.89);Muscle weakness (generalized) (M62.81)     Time: 9794-8016 PT Time Calculation (min) (ACUTE ONLY): 43 min  Charges:  $Therapeutic Exercise: 23-37 mins $Therapeutic Activity: 8-22 mins                     Emojean Gertz P, PT Acute Rehabilitation Services Pager: [phone] Office: [place] Jamear Carbonneau 02/15/2020, 1:57 PM

## 2020-02-15 NOTE — Progress Notes (Signed)
NAME:  Alisha Stephenson, MRN:  [id number], DOB:  Apr 05, 1966, LOS: 66 ADMISSION DATE:  02/03/2020, CONSULTATION DATE:  8/3 REFERRING MD:  Alfredia Ferguson, CHIEF COMPLAINT:  Dyspnea   Brief History   54 y/o female admitted on 8/2 with severe acute respiratory failure with hypoxemia due to COVID 19 pneumonia.  She developed symptoms 1 week prior to admission.  Past Medical History  DM2 Diverticulitis Gallstones Ovarian cyst NAFLD Asthma  Significant Hospital Events   8/2 admit 8/3 ICU transfer, intubated 8/4 prone, paralyze 8/9 significant desaturations today 8/9 VV ECMO cannulation 8/13 ICH 8/30 trach  Consults:  PCCM ECMO team  Procedures:  8/3 ETT > 8/30 8/30 8-0 shiley trach 8/3 PICC >  8/9 LIJ MML 8/9 RIJ [place] 42F   Significant Diagnostic Tests:  7/31 CT head > NAICP 7/31 MRI/MRA brain > no acute changes, possibly small aneurysm ACOM 8/13 CT head> multiple areas of ICH 9/9 CXR- significantly worse infiltrates BL  Micro Data:  8/2 blood > NG 8/2 SARS COV 2 > positive 8/4 resp > negative 8/4 urine >  8/12 blood > E. faecalis (pan-sensitive) 8/12 resp: staph aureus> MSSA 8/25 blood>> NG 8/24 BAL> yeast  Antimicrobials:  See fever tab for past abx Current Eraxis 8/26>>9/2 Zosyn 8/23>>9/7  Interim history/subjective:  No events. Remains on ECMO without much change over past week.  Objective   Blood pressure (!) 117/49, pulse 84, temperature 98.5 F (36.9 C), temperature source Axillary, resp. rate 15, height _0  (1.626 m), weight 121.2 kg, SpO2 92 %.    Vent Mode: PCV FiO2 (%):  [40 %] 40 % Set Rate:  [15 bmp] 15 bmp Vt Set:  [350 mL] 350 mL PEEP:  [10 cmH20] 10 cmH20 Plateau Pressure:  [17 cmH20-21 cmH20] 17 cmH20   Intake/Output Summary (Last 24 hours) at 02/15/2020 0715 Last data filed at 02/15/2020 0700 Gross per 24 hour  Intake 1055.75 ml  Output 400 ml  Net 655.75 ml   Filed Weights   02/13/20 1015 02/14/20 0500 02/15/20 0500  Weight: 121.5  kg 119.6 kg 121.2 kg    Examination:  GEN: no acute distress watching TV HEENT: trach in place, bleeding seems to have eased up CV: RRR, ext warm PULM: shallow inspiratory efforts, nonlabored GI: soft, hypoactive BS EXT: trace anasarca NEURO: follows commands but only to certain people PSYCH: RASS 0 SKIN: some vaginal candidiasis, given fluconazole x 1 yesterday  Pressure Injury 02/14/20 Anus Medial Deep Tissue Pressure Injury - Purple or maroon localized area of discolored intact skin or blood-filled blister due to damage of underlying soft tissue from pressure and/or shear. (Active)  02/14/20 1300  Location: Anus  Location Orientation: Medial  Staging: Deep Tissue Pressure Injury - Purple or maroon localized area of discolored intact skin or blood-filled blister due to damage of underlying soft tissue from pressure and/or shear.  Wound Description (Comments):   Present on Admission: No     ABG paO2 58 BMP okay CBC stable No new micro data Fibrinogen 547>>571>>481>>464 LDH 489>>401>>376>>326 PTT 38 Net +0.6L  Resolved Hospital Problem list   MSSA pneumonia Fungal pneumonia- Candida dublinensis Enterococcal bacteremia-recent blood cultures cleared Concern for possible embolic phenomenon- fingers and possibly CVAs were embolic  Assessment & Plan:    Critically ill due to Acute hypoxic and AoC hypercapneic respiratory failure; likely underly OHS requiring VV ECMO support and mechanical ventilation.  ARDS due to COVID 19 pneumonia Tracheostomy in place -Continue ultra lung protective ventilation.  -Continue VV ECMO  support - wean sweep again today, check CXR -VAP prevention protocol -Not currently requiring sedation for mechanical ventilation. -Routine trach care -Antimicrobials per ID - IR Consult for PEG-J  Elevated LDH, hypo-fibrinogenemia and thrombocytopenia--improving since circuit change. -Continue to monitor  ICH, multifocal. L-sided weakness.  Critical  illness myopathy -Continue PT, OT  Acute metabolic encephalopathy, intermittent - IV Sedation requirements minimal; only PRN now -Continue melatonin and seroquel  Acute renal failure, severe uremia.  Anuric. -Probably need to switch back to CRRT, will discuss with team today -Renally dose medications -Strict I/os  Shock related to  RV strain - now resolved - Continue midodrine  Hyperglycemia > not controlled at all with [place] insulin despite aggressive upward titration.  Assume poor absorption, potentially due to subcutaneous edema.   -Improved on IV, may want to consider another trial of subcutaneous at some point  Best practice:  Diet: tube feeding Pain/Anxiety/Delirium protocol (if indicated): as above VAP protocol (if indicated): yes DVT prophylaxis: SCDs , heparin fixed dose GI prophylaxis: pantoprazole Glucose control:  insulin infusion  Mobility: bed rest Code Status: limited Family Communication: [name] updated daily Disposition: ICU  This patient is critically ill with multiple organ system failure which requires frequent high complexity decision making, assessment, support, evaluation, and titration of therapies. This was completed through the application of advanced monitoring technologies and extensive interpretation of multiple databases. During this encounter critical care time was devoted to patient care services described in this note for 36 minutes.  Candee Furbish, MD 02/15/20 7:15 AM [facility]

## 2020-02-16 ENCOUNTER — Inpatient Hospital Stay (HOSPITAL_COMMUNITY): Payer: PRIVATE HEALTH INSURANCE

## 2020-02-16 HISTORY — PX: IR GASTROSTOMY TUBE MOD SED: IMG625

## 2020-02-16 HISTORY — PX: IR GASTR TUBE CONVERT GASTR-JEJ PER W/FL MOD SED: IMG2332

## 2020-02-16 LAB — CBC
HCT: 27 % — ABNORMAL LOW (ref 36.0–46.0)
HCT: 27.5 % — ABNORMAL LOW (ref 36.0–46.0)
Hemoglobin: 8.1 g/dL — ABNORMAL LOW (ref 12.0–15.0)
Hemoglobin: 8 g/dL — ABNORMAL LOW (ref 12.0–15.0)
MCH: 33.5 pg (ref 26.0–34.0)
MCH: 32.8 pg (ref 26.0–34.0)
MCHC: 30 g/dL (ref 30.0–36.0)
MCHC: 29.1 g/dL — ABNORMAL LOW (ref 30.0–36.0)
MCV: 111.6 fL — ABNORMAL HIGH (ref 80.0–100.0)
MCV: 112.7 fL — ABNORMAL HIGH (ref 80.0–100.0)
Platelets: 102 10*3/uL — ABNORMAL LOW (ref 150–400)
Platelets: 103 10*3/uL — ABNORMAL LOW (ref 150–400)
RBC: 2.42 MIL/uL — ABNORMAL LOW (ref 3.87–5.11)
RBC: 2.44 MIL/uL — ABNORMAL LOW (ref 3.87–5.11)
RDW: 28.7 % — ABNORMAL HIGH (ref 11.5–15.5)
RDW: 28.2 % — ABNORMAL HIGH (ref 11.5–15.5)
WBC: 7 10*3/uL (ref 4.0–10.5)
WBC: 7.4 10*3/uL (ref 4.0–10.5)
nRBC: 0.6 % — ABNORMAL HIGH (ref 0.0–0.2)
nRBC: 0.7 % — ABNORMAL HIGH (ref 0.0–0.2)

## 2020-02-16 LAB — POCT I-STAT 7, (LYTES, BLD GAS, ICA,H+H)
Acid-Base Excess: 0 mmol/L (ref 0.0–2.0)
Acid-Base Excess: 2 mmol/L (ref 0.0–2.0)
Acid-Base Excess: 1 mmol/L (ref 0.0–2.0)
Acid-Base Excess: 4 mmol/L — ABNORMAL HIGH (ref 0.0–2.0)
Bicarbonate: 24.3 mmol/L (ref 20.0–28.0)
Bicarbonate: 27.3 mmol/L (ref 20.0–28.0)
Bicarbonate: 24.8 mmol/L (ref 20.0–28.0)
Bicarbonate: 27.5 mmol/L (ref 20.0–28.0)
Calcium, Ion: 1.15 mmol/L (ref 1.15–1.40)
Calcium, Ion: 1.14 mmol/L — ABNORMAL LOW (ref 1.15–1.40)
Calcium, Ion: 1.11 mmol/L — ABNORMAL LOW (ref 1.15–1.40)
Calcium, Ion: 1.12 mmol/L — ABNORMAL LOW (ref 1.15–1.40)
HCT: 27 % — ABNORMAL LOW (ref 36.0–46.0)
HCT: 27 % — ABNORMAL LOW (ref 36.0–46.0)
HCT: 25 % — ABNORMAL LOW (ref 36.0–46.0)
HCT: 24 % — ABNORMAL LOW (ref 36.0–46.0)
Hemoglobin: 9.2 g/dL — ABNORMAL LOW (ref 12.0–15.0)
Hemoglobin: 9.2 g/dL — ABNORMAL LOW (ref 12.0–15.0)
Hemoglobin: 8.5 g/dL — ABNORMAL LOW (ref 12.0–15.0)
Hemoglobin: 8.2 g/dL — ABNORMAL LOW (ref 12.0–15.0)
O2 Saturation: 95 %
O2 Saturation: 88 %
O2 Saturation: 91 %
O2 Saturation: 94 %
Patient temperature: 98.7
Patient temperature: 36.5
Patient temperature: 37.1
Patient temperature: 98.2
Potassium: 3.6 mmol/L (ref 3.5–5.1)
Potassium: 4.1 mmol/L (ref 3.5–5.1)
Potassium: 4.1 mmol/L (ref 3.5–5.1)
Potassium: 4.5 mmol/L (ref 3.5–5.1)
Sodium: 139 mmol/L (ref 135–145)
Sodium: 138 mmol/L (ref 135–145)
Sodium: 140 mmol/L (ref 135–145)
Sodium: 139 mmol/L (ref 135–145)
TCO2: 25 mmol/L (ref 22–32)
TCO2: 29 mmol/L (ref 22–32)
TCO2: 26 mmol/L (ref 22–32)
TCO2: 29 mmol/L (ref 22–32)
pCO2 arterial: 38.7 mmHg (ref 32.0–48.0)
pCO2 arterial: 44.7 mmHg (ref 32.0–48.0)
pCO2 arterial: 37.1 mmHg (ref 32.0–48.0)
pCO2 arterial: 37.6 mmHg (ref 32.0–48.0)
pH, Arterial: 7.406 (ref 7.350–7.450)
pH, Arterial: 7.392 (ref 7.350–7.450)
pH, Arterial: 7.434 (ref 7.350–7.450)
pH, Arterial: 7.472 — ABNORMAL HIGH (ref 7.350–7.450)
pO2, Arterial: 74 mmHg — ABNORMAL LOW (ref 83.0–108.0)
pO2, Arterial: 54 mmHg — ABNORMAL LOW (ref 83.0–108.0)
pO2, Arterial: 59 mmHg — ABNORMAL LOW (ref 83.0–108.0)
pO2, Arterial: 67 mmHg — ABNORMAL LOW (ref 83.0–108.0)

## 2020-02-16 LAB — RENAL FUNCTION PANEL
Albumin: 2.3 g/dL — ABNORMAL LOW (ref 3.5–5.0)
Albumin: 2.4 g/dL — ABNORMAL LOW (ref 3.5–5.0)
Anion gap: 11 (ref 5–15)
Anion gap: 11 (ref 5–15)
BUN: 35 mg/dL — ABNORMAL HIGH (ref 6–20)
BUN: 26 mg/dL — ABNORMAL HIGH (ref 6–20)
CO2: 23 mmol/L (ref 22–32)
CO2: 23 mmol/L (ref 22–32)
Calcium: 8.3 mg/dL — ABNORMAL LOW (ref 8.9–10.3)
Calcium: 8.4 mg/dL — ABNORMAL LOW (ref 8.9–10.3)
Chloride: 103 mmol/L (ref 98–111)
Chloride: 106 mmol/L (ref 98–111)
Creatinine, Ser: 1.75 mg/dL — ABNORMAL HIGH (ref 0.44–1.00)
Creatinine, Ser: 1.6 mg/dL — ABNORMAL HIGH (ref 0.44–1.00)
GFR calc Af Amer: 37 mL/min — ABNORMAL LOW (ref >60)
GFR calc Af Amer: 42 mL/min — ABNORMAL LOW (ref >60)
GFR calc non Af Amer: 32 mL/min — ABNORMAL LOW (ref >60)
GFR calc non Af Amer: 36 mL/min — ABNORMAL LOW (ref >60)
Glucose, Bld: 147 mg/dL — ABNORMAL HIGH (ref 70–99)
Glucose, Bld: 130 mg/dL — ABNORMAL HIGH (ref 70–99)
Phosphorus: 2.7 mg/dL (ref 2.5–4.6)
Phosphorus: 3.3 mg/dL (ref 2.5–4.6)
Potassium: 3.7 mmol/L (ref 3.5–5.1)
Potassium: 4 mmol/L (ref 3.5–5.1)
Sodium: 137 mmol/L (ref 135–145)
Sodium: 140 mmol/L (ref 135–145)

## 2020-02-16 LAB — GLUCOSE, CAPILLARY
Glucose-Capillary: 102 mg/dL — ABNORMAL HIGH (ref 70–99)
Glucose-Capillary: 111 mg/dL — ABNORMAL HIGH (ref 70–99)
Glucose-Capillary: 111 mg/dL — ABNORMAL HIGH (ref 70–99)
Glucose-Capillary: 113 mg/dL — ABNORMAL HIGH (ref 70–99)
Glucose-Capillary: 122 mg/dL — ABNORMAL HIGH (ref 70–99)
Glucose-Capillary: 123 mg/dL — ABNORMAL HIGH (ref 70–99)
Glucose-Capillary: 128 mg/dL — ABNORMAL HIGH (ref 70–99)
Glucose-Capillary: 131 mg/dL — ABNORMAL HIGH (ref 70–99)
Glucose-Capillary: 132 mg/dL — ABNORMAL HIGH (ref 70–99)
Glucose-Capillary: 135 mg/dL — ABNORMAL HIGH (ref 70–99)
Glucose-Capillary: 137 mg/dL — ABNORMAL HIGH (ref 70–99)
Glucose-Capillary: 138 mg/dL — ABNORMAL HIGH (ref 70–99)
Glucose-Capillary: 147 mg/dL — ABNORMAL HIGH (ref 70–99)
Glucose-Capillary: 148 mg/dL — ABNORMAL HIGH (ref 70–99)
Glucose-Capillary: 154 mg/dL — ABNORMAL HIGH (ref 70–99)
Glucose-Capillary: 154 mg/dL — ABNORMAL HIGH (ref 70–99)
Glucose-Capillary: 156 mg/dL — ABNORMAL HIGH (ref 70–99)
Glucose-Capillary: 157 mg/dL — ABNORMAL HIGH (ref 70–99)
Glucose-Capillary: 171 mg/dL — ABNORMAL HIGH (ref 70–99)
Glucose-Capillary: 97 mg/dL (ref 70–99)

## 2020-02-16 LAB — HEPARIN LEVEL (UNFRACTIONATED): Heparin Unfractionated: <0.1 IU/mL — ABNORMAL LOW (ref 0.30–0.70)

## 2020-02-16 LAB — LACTATE DEHYDROGENASE: LDH: 321 U/L — ABNORMAL HIGH (ref 98–192)

## 2020-02-16 LAB — FIBRINOGEN: Fibrinogen: 431 mg/dL (ref 210–475)

## 2020-02-16 LAB — APTT
aPTT: 39 seconds — ABNORMAL HIGH (ref 24–36)
aPTT: 34 seconds (ref 24–36)

## 2020-02-16 LAB — MAGNESIUM: Magnesium: 2.6 mg/dL — ABNORMAL HIGH (ref 1.7–2.4)

## 2020-02-16 LAB — LACTIC ACID, PLASMA
Lactic Acid, Venous: 1.2 mmol/L (ref 0.5–1.9)
Lactic Acid, Venous: 1.2 mmol/L (ref 0.5–1.9)

## 2020-02-16 MED ORDER — ALBUMIN HUMAN 5 % IV SOLN
INTRAVENOUS | Status: AC
  Filled 2020-02-16: qty 250

## 2020-02-16 MED ORDER — CALCIUM CHLORIDE 10 % IV SOLN
INTRAVENOUS | Status: AC
  Filled 2020-02-16: qty 10

## 2020-02-16 MED ORDER — FENTANYL CITRATE (PF) 100 MCG/2ML IJ SOLN
INTRAMUSCULAR | Status: AC
Start: 2020-02-16 — End: 2020-02-17
  Filled 2020-02-16: qty 2

## 2020-02-16 MED ORDER — GLUCAGON HCL RDNA (DIAGNOSTIC) 1 MG IJ SOLR
INTRAMUSCULAR | Status: AC
  Filled 2020-02-16: qty 1

## 2020-02-16 MED ORDER — SODIUM BICARBONATE 8.4 % IV SOLN
INTRAVENOUS | Status: AC
  Filled 2020-02-16: qty 50

## 2020-02-16 MED ORDER — HEPARIN (PORCINE) 25000 UT/250ML-% IV SOLN
1200.0000 [IU]/h | INTRAVENOUS | Status: DC
  Administered 2020-02-18: 500 [IU]/h via INTRAVENOUS
  Administered 2020-02-19: 750 [IU]/h via INTRAVENOUS
  Administered 2020-02-21: 850 [IU]/h via INTRAVENOUS
  Administered 2020-02-22: 800 [IU]/h via INTRAVENOUS
  Administered 2020-02-23: 900 [IU]/h via INTRAVENOUS
  Administered 2020-02-24: 950 [IU]/h via INTRAVENOUS
  Administered 2020-02-25: 1000 [IU]/h via INTRAVENOUS
  Administered 2020-02-26: 1050 [IU]/h via INTRAVENOUS
  Administered 2020-02-27: 1100 [IU]/h via INTRAVENOUS
  Administered 2020-02-28 – 2020-02-29 (×3): 1150 [IU]/h via INTRAVENOUS
  Administered 2020-03-02: 1300 [IU]/h via INTRAVENOUS
  Administered 2020-03-02: 1200 [IU]/h via INTRAVENOUS
  Administered 2020-03-03: 1250 [IU]/h via INTRAVENOUS
  Filled 2020-02-16 (×15): qty 250

## 2020-02-16 MED ORDER — MIDAZOLAM HCL 2 MG/2ML IJ SOLN
INTRAMUSCULAR | Status: AC
  Filled 2020-02-16: qty 2

## 2020-02-16 MED ORDER — PHENYLEPHRINE 40 MCG/ML (10ML) SYRINGE FOR IV PUSH (FOR BLOOD PRESSURE SUPPORT)
PREFILLED_SYRINGE | INTRAVENOUS | Status: AC
  Filled 2020-02-16: qty 10

## 2020-02-16 MED ORDER — LIDOCAINE HCL 1 % IJ SOLN
INTRAMUSCULAR | Status: AC
  Filled 2020-02-16: qty 20

## 2020-02-16 MED ORDER — EPINEPHRINE 1 MG/10ML IJ SOSY
PREFILLED_SYRINGE | INTRAMUSCULAR | Status: AC
  Filled 2020-02-16: qty 20

## 2020-02-16 MED ORDER — LIDOCAINE HCL 1 % IJ SOLN
INTRAMUSCULAR | Status: AC | PRN
  Administered 2020-02-16: 10 mL via INTRADERMAL

## 2020-02-16 MED ORDER — MIDAZOLAM HCL 2 MG/2ML IJ SOLN
INTRAMUSCULAR | Status: AC | PRN
  Administered 2020-02-16 (×2): 1 mg via INTRAVENOUS

## 2020-02-16 MED ORDER — FENTANYL CITRATE (PF) 100 MCG/2ML IJ SOLN
INTRAMUSCULAR | Status: AC | PRN
  Administered 2020-02-16: 50 ug via INTRAVENOUS

## 2020-02-16 MED ORDER — IOHEXOL 300 MG/ML  SOLN
50.0000 mL | Freq: Once | INTRAMUSCULAR | Status: AC | PRN
  Administered 2020-02-16: 25 mL

## 2020-02-16 NOTE — Sedation Documentation (Signed)
Patient under care of ECMO nurse and primary nurse.  See flowsheets.

## 2020-02-16 NOTE — Plan of Care (Signed)
Problem: Education: Goal: Knowledge of General Education information will improve Description: Including pain rating scale, medication(s)/side effects and non-pharmacologic comfort measures Outcome: Progressing   Problem: Health Behavior/Discharge Planning: Goal: Ability to manage health-related needs will improve Outcome: Progressing   Problem: Clinical Measurements: Goal: Ability to maintain clinical measurements within normal limits will improve Outcome: Progressing Goal: Will remain free from infection Outcome: Progressing Goal: Diagnostic test results will improve Outcome: Progressing Goal: Respiratory complications will improve Outcome: Progressing Goal: Cardiovascular complication will be avoided Outcome: Progressing   Problem: Activity: Goal: Risk for activity intolerance will decrease Outcome: Progressing   Problem: Nutrition: Goal: Adequate nutrition will be maintained Outcome: Progressing   Problem: Coping: Goal: Level of anxiety will decrease Outcome: Progressing   Problem: Elimination: Goal: Will not experience complications related to bowel motility Outcome: Progressing Goal: Will not experience complications related to urinary retention Outcome: Progressing   Problem: Pain Managment: Goal: General experience of comfort will improve Outcome: Progressing   Problem: Safety: Goal: Ability to remain free from injury will improve Outcome: Progressing   Problem: Skin Integrity: Goal: Risk for impaired skin integrity will decrease Outcome: Progressing   Problem: Education: Goal: Knowledge of risk factors and measures for prevention of condition will improve Outcome: Progressing   Problem: Coping: Goal: Psychosocial and spiritual needs will be supported Outcome: Progressing   Problem: Respiratory: Goal: Will maintain a patent airway Outcome: Progressing Goal: Complications related to the disease process, condition or treatment will be avoided or  minimized Outcome: Progressing   Problem: Education: Goal: Knowledge of risk factors and measures for prevention of condition will improve Outcome: Progressing   Problem: Coping: Goal: Psychosocial and spiritual needs will be supported Outcome: Progressing   Problem: Respiratory: Goal: Will maintain a patent airway Outcome: Progressing Goal: Complications related to the disease process, condition or treatment will be avoided or minimized Outcome: Progressing   Problem: Education: Goal: Ability to describe self-care measures that may prevent or decrease complications (Diabetes Survival Skills Education) will improve Outcome: Progressing Goal: Individualized Educational Video(s) Outcome: Progressing   Problem: Coping: Goal: Ability to adjust to condition or change in health will improve Outcome: Progressing   Problem: Fluid Volume: Goal: Ability to maintain a balanced intake and output will improve Outcome: Progressing   Problem: Health Behavior/Discharge Planning: Goal: Ability to identify and utilize available resources and services will improve Outcome: Progressing Goal: Ability to manage health-related needs will improve Outcome: Progressing   Problem: Metabolic: Goal: Ability to maintain appropriate glucose levels will improve Outcome: Progressing   Problem: Nutritional: Goal: Maintenance of adequate nutrition will improve Outcome: Progressing Goal: Progress toward achieving an optimal weight will improve Outcome: Progressing   Problem: Skin Integrity: Goal: Risk for impaired skin integrity will decrease Outcome: Progressing   Problem: Tissue Perfusion: Goal: Adequacy of tissue perfusion will improve Outcome: Progressing   Problem: Education: Goal: Knowledge of risk factors and measures for prevention of condition will improve Outcome: Progressing   Problem: Coping: Goal: Psychosocial and spiritual needs will be supported Outcome: Progressing    Problem: Respiratory: Goal: Will maintain a patent airway Outcome: Progressing Goal: Complications related to the disease process, condition or treatment will be avoided or minimized Outcome: Progressing   Problem: Education: Goal: Knowledge of disease or condition will improve Outcome: Progressing Goal: Knowledge of secondary prevention will improve Outcome: Progressing Goal: Knowledge of patient specific risk factors addressed and post discharge goals established will improve Outcome: Progressing Goal: Individualized Educational Video(s) Outcome: Progressing   Problem: Coping: Goal: Will verbalize  positive feelings about self Outcome: Progressing Goal: Will identify appropriate support needs Outcome: Progressing   Problem: Health Behavior/Discharge Planning: Goal: Ability to manage health-related needs will improve Outcome: Progressing   Problem: Self-Care: Goal: Ability to participate in self-care as condition permits will improve Outcome: Progressing Goal: Verbalization of feelings and concerns over difficulty with self-care will improve Outcome: Progressing Goal: Ability to communicate needs accurately will improve Outcome: Progressing   Problem: Nutrition: Goal: Risk of aspiration will decrease Outcome: Progressing Goal: Dietary intake will improve Outcome: Progressing   Problem: Intracerebral Hemorrhage Tissue Perfusion: Goal: Complications of Intracerebral Hemorrhage will be minimized Outcome: Progressing   Problem: Ischemic Stroke/TIA Tissue Perfusion: Goal: Complications of ischemic stroke/TIA will be minimized Outcome: Progressing   Problem: Spontaneous Subarachnoid Hemorrhage Tissue Perfusion: Goal: Complications of Spontaneous Subarachnoid Hemorrhage will be minimized Outcome: Progressing

## 2020-02-16 NOTE — Procedures (Signed)
Interventional Radiology Procedure Note  Procedure: Placement of percutaneous 56F pull-through gastrostomy tube and addition of a 81F jejunal arm.  Complications: None Recommendations: - Maintain G-tube to LWS x 3hrs - Ok to use g-tube for oral meds that cannot be administered IV - Ok to begin using J arm for feeds now  Signed,  Criselda Peaches, MD

## 2020-02-16 NOTE — Progress Notes (Signed)
ANTICOAGULATION CONSULT NOTE - [place] for Heparin Indication: ECMO  Allergies  Allergen Reactions  . Sulfa Antibiotics Rash and Other (See Comments)    Patient Measurements: Height: 5\' 4"  (162.6 cm) Weight: 119.8 kg (264 lb 1.8 oz) IBW/kg (Calculated) : 54.7 Heparin Dosing Weight: 87 kg  Vital Signs: Temp: 98.5 F (36.9 C) (09/09 1900) Temp Source: Oral (09/09 1900) Pulse Rate: 89 (09/10 0500)  Labs: Recent Labs    02/14/20 0249 02/14/20 0406 02/15/20 0407 02/15/20 0408 02/15/20 1207 02/15/20 1614 02/15/20 1614 02/15/20 1947 02/15/20 1947 02/16/20 0421 02/16/20 0436  HGB 8.5*   < > 8.2*  --    < > 8.7*   < > 8.8*   < > 8.1* 9.2*  HCT 28.2*   < > 28.0*  --    < > 28.5*   < > 26.0*  --  27.0* 27.0*  PLT 104*   < > 95*  --   --  100*  --   --   --  102*  --   APTT 38*   < > 38*  --   --  38*  --   --   --  39*  --   HEPARINUNFRC <0.10*  --   --  <0.10*  --   --   --   --   --  <0.10*  --   CREATININE 1.77*   < > 2.93*  --   --  2.36*  --   --   --  1.75*  --    < > = values in this interval not displayed.    Estimated Creatinine Clearance: 46.3 mL/min (A) (by C-G formula based on SCr of 1.75 mg/dL (H)).   Medical History: Past Medical History:  Diagnosis Date  . Asthma   . Diabetes mellitus without complication ([place])   . Diverticulitis   . Gallstones   . IBS (irritable bowel syndrome)   . NAFLD (nonalcoholic fatty liver disease)    CT scan 2015  . Ovarian cyst     Assessment: 54 yo female who is COVID+ on VV ECMO. Patient was started on bivalirudin but was found to have multiple ICH on 8/13 head CT. Bivalirudin was stopped at this time and pt has been off anticoagulation. Her ECMO circuit was changed 8/24, and pharmacy asked to start low-fixed rate heparin.  Heparin infusion was held last week due to bleeding from trach site. Hgb stable at 9.2, plt 95, fibrinogen 431, LDH 316.   Heparin at 500 units/hr with plans to not  titrate for now. Heparin level remains undetectable, aPTT 39.  Goal of Therapy:  Heparin level 0.2-0.5 units/ml - currently not titrating to goal per discussion with ECMO team Monitor platelets by anticoagulation protocol: Yes   Plan:  Continue Heparin infusion at 500 units/hr  Monitor daily CBC, LDH, fibrinogen, s/sx of bleeding  Hildred Laser, PharmD Clinical Pharmacist **Pharmacist phone directory can now be found on [email or web address] ([email or web address]).  Listed under [place].

## 2020-02-16 NOTE — Progress Notes (Signed)
Pt transported to IR with Gay Filler (ECMO Coord./Perfusionist). Vitals signs noted to be within assigned parameters see flowsheet for details. Centrimag flow of 4.98 at 4300 rpms prior to transport. Emergency medications, blood and equipment at bedside.

## 2020-02-16 NOTE — Progress Notes (Signed)
Prior transport vitals to IR

## 2020-02-16 NOTE — Procedures (Signed)
Extracorporeal support note   ECLS support day: 32 (cannulated 01/29/2020) Indication: Covid 19 pneumonia/ARDS  Configuration: VV  Drainage cannula: right IJ [place]  Return cannula: same  Pump speed: 4000 Pump flow: 4.6 Pump used: Centrimag  Oxygenator: Quadrox O2 blender: 100% Sweep gas: 9  ABG    Component Value Date/Time   PHART 7.434 02/16/2020 1732   PCO2ART 37.1 02/16/2020 1732   PO2ART 59 (L) 02/16/2020 1732   HCO3 24.8 02/16/2020 1732   TCO2 26 02/16/2020 1732   ACIDBASEDEF 1.0 02/15/2020 1947   O2SAT 91.0 02/16/2020 1732    Circuit check: no visible clot Anticoagulant: Continue heparin Anticoagulation targets: fixed dose heparin given high bleeding risk  Changes in support: none today in preparation for procedure   Anticipated goals/duration of support: bridge to recovery  Julian Hy, MD 02/16/20 7:12 PM [facility]

## 2020-02-16 NOTE — Progress Notes (Signed)
[facility] Progress Note    Assessment/ Plan:   1.  Anuric AKI due to shock, sepsis causing acute tubular necrosis:CRRT on 8/22- 02/12/20. IHD 02/13/20.  Planned for 9/9 but will go back on CRRT d/t fluid overload.  All 4K, no hepari.  L IJ nontunneled catheter changed 9/9.   2.Shock due to sepsis, Covid infection, blood cultures positive for Enterococcus bacteremia:s/p Zosyn (ended 9/7), cultures repeated 9/7.  TTE 9/7 no AI, done for concern of wide pulse pressure  3.Acute hypoxic and hypercapnic respiratory failure, ARDS due to COVID-19 pneumonia: Currently on VV ECMO for refractory hypoxemia.  3.Hypernatremia, hypervolemia: relative free water deficit. Improved with CRRT.  4.Acute toxic metabolic encephalopathy:Multifactorial, improving  5.Anemia, thrombocytopenia: With hemolysis likely and acute illness contributing. Transfusions per primary team.Seen by hematologist.  6.Intracranial hemorrhage: With left-sided weakness  7.  Diabetes per primary  8.  Hypophosphatemia improved  Subjective:    Seen in room.  On CRRT.  To IR for PEG today.  HD nontunneled catheter changed to L IJ.   Objective:   BP (!) 145/55   Pulse 90   Temp 97.7 F (36.5 C) (Oral)   Resp 19   Ht _0  (1.626 m)   Wt 119.8 kg   SpO2 (!) 89%   BMI 45.33 kg/m   Intake/Output Summary (Last 24 hours) at 02/16/2020 0902 Last data filed at 02/16/2020 0800 Gross per 24 hour  Intake 1077.47 ml  Output 1834 ml  Net -756.53 ml   Weight change: -1.4 kg  Physical Exam: Gen: critically ill, lying in bed NECK: R IJ ECMO access CVS: RRR Resp: mech bilaterally Abd: obese Ext: 1+ edema bilaterally ACCESS: L IJ nontunneled HD catheter  Imaging: DG CHEST PORT 1 VIEW  Result Date: 02/16/2020 CLINICAL DATA:  COVID-19 infection on ECMO EXAM: PORTABLE CHEST 1 VIEW COMPARISON:  02/15/2020 FINDINGS: ECMO cannula, tracheostomy tube and left jugular temporary dialysis catheter are again  noted and stable. Right-sided PICC line is seen extending to the cavoatrial junction. Gastric catheter extends into the stomach. Diffuse bilateral airspace opacity is noted with air bronchograms similar to that seen on the prior exam. No significant improvement is noted. No bony abnormality is seen. IMPRESSION: Diffuse bilateral opacities consistent with the given clinical history. Tubes and lines as described stable in appearance. Electronically Signed   By: Inez Catalina M.D.   On: 02/16/2020 06:33   DG CHEST PORT 1 VIEW  Result Date: 02/15/2020 CLINICAL DATA:  Encounter for hemodialysis.  Respiratory distress. EXAM: PORTABLE CHEST 1 VIEW COMPARISON:  Radiograph earlier this day. FINDINGS: New left internal jugular dialysis catheter tip in the upper SVC. No visualized pneumothorax. Right internal jugular ECMO catheter tip in the upper abdomen not entirely included in the field of view. Tracheostomy tube tip at the thoracic inlet. Enteric tube remains in place. Near complete opacification throughout both hemi thoraces with slight improved aeration from prior exam and increasing ventilation in the perihilar lungs. IMPRESSION: 1. New left internal jugular dialysis catheter tip in the upper SVC. No visualized pneumothorax. 2. Additional support apparatus unchanged. 3. Near complete opacification throughout both lungs with slight improved aeration from prior exam and increasing ventilation in the perihilar lungs. Electronically Signed   By: Keith Rake M.D.   On: 02/15/2020 19:05   DG CHEST PORT 1 VIEW  Result Date: 02/15/2020 CLINICAL DATA:  Tracheostomy. ECMO. Respiratory failure. Recent COVID. EXAM: PORTABLE CHEST 1 VIEW COMPARISON:  02/14/2020. FINDINGS: Tracheostomy tube, NG tube, right PICC  line, ECMO device in stable position. Left subclavian line stable position with tip over left subclavian vein. Complete consolidation of both lungs again noted. Heart is obscured. Prior cervical fusion. IMPRESSION:  1.  Lines and tubes stable position. 2. Complete consolidation of both lungs again noted. No interim change. Electronically Signed   By: Marcello Moores  Register   On: 02/15/2020 07:42    Labs: BMET Recent Labs  Lab 02/12/20 0405 02/12/20 0516 02/12/20 1130 02/12/20 1625 02/13/20 0314 02/13/20 0315 02/13/20 1611 02/13/20 1723 02/14/20 0249 02/14/20 0406 02/14/20 1600 02/14/20 1603 02/15/20 0407 02/15/20 1207 02/15/20 1311 02/15/20 1426 02/15/20 1614 02/15/20 1947 02/16/20 0421 02/16/20 0436 02/16/20 0801  NA 137   < > 136   < > 137   < > 138   < > 136   < > 138   < > 138   < > 140 140 138 140 137 139 138  K 4.0   < > 4.0   < > 3.8   < > 4.0   < > 3.8   < > 4.1   < > 3.8   < > 4.2 4.5 4.2 4.2 3.7 3.6 4.1  CL 102   < > 101   < > 100  --  101  --  100  --  101  --  103  --   --   --  104  --  103  --   --   CO2 22   < > 22   < > 23  --  25  --  22  --  21*  --  19*  --   --   --  21*  --  23  --   --   GLUCOSE 126*   < > 145*   < > 194*  --  176*  --  178*  --  153*  --  186*  --   --   --  146*  --  147*  --   --   BUN 14   < > 16   < > 28*  --  23*  --  31*  --  47*  --  58*  --   --   --  48*  --  35*  --   --   CREATININE 0.91   < > 0.96   < > 1.32*  --  1.30*  --  1.77*  --  2.42*  --  2.93*  --   --   --  2.36*  --  1.75*  --   --   CALCIUM 8.5*   < > 8.4*   < > 8.6*  --  8.2*  --  8.5*  --  8.7*  --  8.7*  --   --   --  8.5*  --  8.3*  --   --   PHOS 2.5  --  3.6  --  3.7  --   --   --  3.6  --   --   --  5.2*  --   --   --  4.1  --  2.7  --   --    < > = values in this interval not displayed.   CBC Recent Labs  Lab 02/14/20 1600 02/14/20 1603 02/15/20 0407 02/15/20 1207 02/15/20 1614 02/15/20 1614 02/15/20 1947 02/16/20 0421 02/16/20 0436 02/16/20 0801  WBC 11.6*  --  9.8  --  9.9  --   --  7.0  --   --   HGB 8.6*   < > 8.2*   < > 8.7*   < > 8.8* 8.1* 9.2* 9.2*  HCT 28.2*   < > 28.0*   < > 28.5*   < > 26.0* 27.0* 27.0* 27.0*  MCV 110.6*  --  111.1*  --  111.8*  --    --  111.6*  --   --   PLT 94*  --  95*  --  100*  --   --  102*  --   --    < > = values in this interval not displayed.    Medications:    . amiodarone  200 mg Per Tube Daily  . vitamin C  500 mg Per Tube Daily  . chlorhexidine gluconate (MEDLINE KIT)  15 mL Mouth Rinse BID  . Chlorhexidine Gluconate Cloth  6 each Topical Q0600  . clonazePAM  1 mg Per Tube Q6H  . clotrimazole  1 Applicatorful Vaginal QHS  . docusate  100 mg Per Tube BID  . feeding supplement (PROSource TF)  45 mL Per Tube QID  . Gerhardt's butt cream   Topical BID  . linagliptin  5 mg Per Tube Daily  . mouth rinse  15 mL Mouth Rinse 10 times per day  . melatonin  9 mg Per Tube QHS  . midodrine  10 mg Per Tube TID  . multivitamin  1 tablet Per Tube QHS  . oxyCODONE  10 mg Per Tube Q6H  . pantoprazole (PROTONIX) IV  40 mg Intravenous BID  . polyethylene glycol  17 g Per Tube Daily  . QUEtiapine  50 mg Per Tube QHS  . sennosides  5 mL Per Tube BID  . zinc sulfate  220 mg Per Tube Daily      Madelon Lips MD 02/16/2020, 9:02 AM

## 2020-02-16 NOTE — Progress Notes (Signed)
Patient was transported from IR to 2H25 on oxygen tank and battery power. Emergency medications and supplies were available at all times. Upon arrival to destination, circuit was connected to wall oxygen source and plugged into a red power outlet. Transport was uneventful.

## 2020-02-16 NOTE — Progress Notes (Addendum)
ANTICOAGULATION CONSULT NOTE - [place] for Heparin Indication: ECMO  Allergies  Allergen Reactions  . Sulfa Antibiotics Rash and Other (See Comments)    Patient Measurements: Height: 5\' 4"  (162.6 cm) Weight: 119.8 kg (264 lb 1.8 oz) IBW/kg (Calculated) : 54.7 Heparin Dosing Weight: 87 kg  Vital Signs: Temp: 97.7 F (36.5 C) (09/10 0800) Temp Source: Oral (09/10 0800) BP: 145/55 (09/10 0731) Pulse Rate: 90 (09/10 1453)  Labs: Recent Labs    02/14/20 0249 02/14/20 0406 02/15/20 0407 02/15/20 0408 02/15/20 1207 02/15/20 1614 02/15/20 1947 02/16/20 0421 02/16/20 0421 02/16/20 0436 02/16/20 0801  HGB 8.5*   < > 8.2*  --    < > 8.7*   < > 8.1*   < > 9.2* 9.2*  HCT 28.2*   < > 28.0*  --    < > 28.5*   < > 27.0*  --  27.0* 27.0*  PLT 104*   < > 95*  --   --  100*  --  102*  --   --   --   APTT 38*   < > 38*  --   --  38*  --  39*  --   --   --   HEPARINUNFRC <0.10*  --   --  <0.10*  --   --   --  <0.10*  --   --   --   CREATININE 1.77*   < > 2.93*  --   --  2.36*  --  1.75*  --   --   --    < > = values in this interval not displayed.    Estimated Creatinine Clearance: 46.3 mL/min (A) (by C-G formula based on SCr of 1.75 mg/dL (H)).   Medical History: Past Medical History:  Diagnosis Date  . Asthma   . Diabetes mellitus without complication ([place])   . Diverticulitis   . Gallstones   . IBS (irritable bowel syndrome)   . NAFLD (nonalcoholic fatty liver disease)    CT scan 2015  . Ovarian cyst     Assessment: 54 yo female COVID+ on VV ECMO. Patient started on bivalirudin but was found to have multiple ICH on 8/13 head CT. Bivalirudin was stopped and pt was off anticoagulation at that time. Her ECMO circuit was changed 8/24. Pharmacy asked to start low-fixed rate heparin. Heparin infusion was held last week due to bleeding from trach site.  Heparin to continue at 500 units/hr with plans to not titrate for now per ECMO team. Heparin level  remained undetectable this morning, aPTT 39. Hgb stable at 9.2, plt 102, fibrinogen 431, LDH 321.  Heparin turned off for PEG placement in IR - now completed 9/10. Per discussion with Jacqulynn Cadet, IR MD, ok to resume heparin at 1900 tonight post-op. No active bleed issues reported.  Goal of Therapy:  Heparin level 0.2-0.5 units/ml - currently not titrating to goal per discussion with ECMO team Monitor platelets by anticoagulation protocol: Yes   Plan:  Resume heparin infusion at 500 units/hr at 1900 tonight per IR recommendations No plans to titrate per ECMO team for now Monitor daily heparin level, CBC, LDH, fibrinogen, s/sx of bleeding   Arturo Morton, PharmD, BCPS Please check AMION for all [place] contact numbers Clinical Pharmacist 02/16/2020 4:54 PM

## 2020-02-16 NOTE — Progress Notes (Signed)
NAME:  Alisha Stephenson, MRN:  [id number], DOB:  1965-09-08, LOS: 14 ADMISSION DATE:  01/07/2020, CONSULTATION DATE:  8/3 REFERRING MD:  Alfredia Ferguson, CHIEF COMPLAINT:  Dyspnea   Brief History   54 y/o female admitted on 8/2 with severe acute respiratory failure with hypoxemia due to COVID 19 pneumonia.  She developed symptoms 1 week prior to admission.  Past Medical History  DM2 Diverticulitis Gallstones Ovarian cyst NAFLD Asthma  Significant Hospital Events   8/2 admit 8/3 ICU transfer, intubated 8/4 prone, paralyze 8/9 significant desaturations today 8/9 VV ECMO cannulation 8/13 ICH 8/30 trach  Consults:  PCCM ECMO team  Procedures:  8/3 ETT > 8/30 8/30 8-0 shiley trach 8/3 PICC >  8/9 LIJ MML 8/9 RIJ [place] 48F   Significant Diagnostic Tests:  7/31 CT head > NAICP 7/31 MRI/MRA brain > no acute changes, possibly small aneurysm ACOM 8/13 CT head> multiple areas of ICH 9/9 CXR- significantly worse infiltrates BL  Micro Data:  8/2 blood > NG 8/2 SARS COV 2 > positive 8/4 resp > negative 8/4 urine >  8/12 blood > E. faecalis (pan-sensitive) 8/12 resp: staph aureus> MSSA 8/25 blood>> NG 8/24 BAL> yeast  Antimicrobials:  See fever tab for past abx Current Eraxis 8/26>>9/2 Zosyn 8/23>>9/7  Interim history/subjective:  Planning for PEG with IR today. Has not tolerated sweep trials this week.  Objective   Blood pressure (!) 145/55, pulse 93, temperature 98.8 F (37.1 C), temperature source Oral, resp. rate 17, height _0  (1.626 m), weight 119.8 kg, SpO2 94 %.    Vent Mode: PCV FiO2 (%):  [40 %] 40 % Set Rate:  [15 bmp] 15 bmp PEEP:  [10 cmH20] 10 cmH20 Plateau Pressure:  [18 cmH20-23 cmH20] 22 cmH20   Intake/Output Summary (Last 24 hours) at 02/16/2020 1854 Last data filed at 02/16/2020 1300 Gross per 24 hour  Intake 594.12 ml  Output 1577 ml  Net -982.88 ml   Filed Weights   02/14/20 0500 02/15/20 0500 02/16/20 0452  Weight: 119.6 kg 121.2 kg 119.8  kg    Examination:  GEN: Critically ill-appearing woman sitting up in bed trached, mechanical ventilation, on ECMO HEENT: [place]/AT, oral mucosa moist. Neck: Tracheostomy, no significant bleeding around stoma. CV: Regular rate and rhythm, no murmurs PULM: Minimal breath sounds bilaterally GI: Obese, soft, nontender, nondistended EXT: Minimal peripheral edema.  2 digits on right hand still bandaged over blisters.  Ongoing bruising on dorsal surface of hand and knuckles NEURO: Awake, tracking, nodding to answer questions.  Still very weak with only toe movement on command. PSYCH: RASS 0, cooperative with exam Derm: No erythema around lines  Pressure Injury 02/14/20 Anus Medial Deep Tissue Pressure Injury - Purple or maroon localized area of discolored intact skin or blood-filled blister due to damage of underlying soft tissue from pressure and/or shear. (Active)  02/14/20 1300  Location: Anus  Location Orientation: Medial  Staging: Deep Tissue Pressure Injury - Purple or maroon localized area of discolored intact skin or blood-filled blister due to damage of underlying soft tissue from pressure and/or shear.  Wound Description (Comments):   Present on Admission: No     ABG paO2 54 BMP stable on CRRT CBC stable Blood culture 9/7 NGTD Fibrinogen 547>>571>>481>>464>  431  LDH 489>>401>>376>>326> 321 PTT 39 Net - 600cc overnight   Vent rate 15, pressure control 12, PEEP 10, 40% FiO2 with tidal volumes about 60 cc  CXR personally reviewed: minimal lung aeration bilaterally, tracheostomy, L IJ trial system  place.  Right IJ ECMO cannula in appropriate position  Resolved Hospital Problem list   MSSA pneumonia Fungal pneumonia- Candida dublinensis Enterococcal bacteremia-recent blood cultures cleared Concern for possible embolic phenomenon- fingers and possibly CVAs were embolic  Assessment & Plan:    Critically ill due to acute hypoxic and AoC hypercapneic respiratory failure;  likely underly OHS.  Requiring VV ECMO support and mechanical ventilation.  ARDS due to COVID 19 pneumonia Tracheostomy in place -Continue VV ECMO support.  No sweep trials today given plans for sedation requirements during interventional radiology procedure. -Continue ultra lung protective ventilation.  Slowly improving. -VAP prevention protocol -Continue to avoid sedation as much as possible to facilitate vent weaning and prevent delirium -Routine tracheostomy care -Continue antibiotics per ID -Not currently requiring sedation for mechanical ventilation.  Elevated LDH, hypo-fibrinogenemia and thrombocytopenia--improved since circuit change. -Continue to monitor per ecmo protocol  ICH, multifocal. L-sided weakness.  Critical illness myopathy--not improving, essentially locked in. -Continue PT, OT  Acute metabolic encephalopathy, intermittent -Continue as needed sedation is required to tolerate mechanical ventilation ECMO therapy.  Avoid as much as possible. -Continue melatonin and seroquel to facilitate normal sleep-wake cycles -Continue physical therapy, Occupational Therapy -Family visitation likely has been helping  Acute renal failure, severe uremia.  Anuric. -Continue CRRT.  Appreciate nephrology's assistance. -Renally dose meds and avoid nephrotoxic meds -Strict I's/O  Shock related to RV strain - now resolved - Continue midodrine.  May require norepinephrine while p.o. intake is on hold with fresh PEG  Hyperglycemia > not controlled at all with [place] insulin despite aggressive upward titration.  Assume poor absorption, potentially due to subcutaneous edema.   -Improved on IV; when euvolemic may be able to tolerate [place] insulin. Will attempt when back on regular TF dose  Best practice:  Diet: tube feeding Pain/Anxiety/Delirium protocol (if indicated): as above VAP protocol (if indicated): yes DVT prophylaxis: SCDs , heparin fixed dose GI prophylaxis: pantoprazole Glucose  control:  insulin infusion  Mobility: bed rest Code Status: limited Family Communication: husband updated by team daily Disposition: ICU  This patient is critically ill with multiple organ system failure which requires frequent high complexity decision making, assessment, support, evaluation, and titration of therapies. This was completed through the application of advanced monitoring technologies and extensive interpretation of multiple databases. During this encounter critical care time was devoted to patient care services described in this note for 41 minutes.  Julian Hy, MD 02/16/20 7:25 PM [facility]

## 2020-02-16 NOTE — Progress Notes (Addendum)
Patient ID: Alisha Stephenson, female   DOB: May 10, 1966, 54 y.o.   MRN: [id number]    Advanced Heart Failure Rounding Note   Subjective:    - 8/2 COVID + test - 8/9 Cannulated for VV ECMO - 8/13 with several areas of intracranial hemorrhage. Bival stopped.  - 8/14 CT no change in [place]. Increased edema - 8/16 Extubated - 8/16 Head CT stable bleed - 8/22 CVVHD started - 8/24 reintubated - 8/24 circuit changed - 8/30 trach - 9/7  Switched to iHD - 02/15/20 Switched back to CVVHD  Remains awake on vent. Following commands but not able to move much - working with PT/OT yesterday.. Switched back to CVVHD and new catheter placed  Pulling - 50. Weight down 3 pounds.   NE down to 2 overnight. Now of as she awakens.   ECMO   Flow 4.6 L RPM 4000 Sweep9  Labs:  7.40/38/74/95% Fio2 40% TV 120-130 Hgb9.2 PLT 104k -> 95k - Pending LDH 489 -> 401 -> 376 -> 326 -> pending Lactic acidpending   Objective:   Weight Range:  Vital Signs:   Temp:  [97.6 F (36.4 C)-98.7 F (37.1 C)] 98.5 F (36.9 C) (09/09 1900) Pulse Rate:  [74-110] 76 (09/10 0400) Resp:  [15] 15 (09/10 0125) BP: (117-130)/(49-54) 117/49 (09/09 0725) SpO2:  [85 %-99 %] 97 % (09/10 0400) Arterial Line BP: (66-154)/(34-75) 127/51 (09/10 0400) FiO2 (%):  [40 %] 40 % (09/10 0125) Weight:  [121.2 kg] 121.2 kg (09/09 0500) Last BM Date: 02/15/20  Weight change: Filed Weights   02/13/20 1015 02/14/20 0500 02/15/20 0500  Weight: 121.5 kg 119.6 kg 121.2 kg    Intake/Output:   Intake/Output Summary (Last 24 hours) at 02/16/2020 0446 Last data filed at 02/16/2020 0400 Gross per 24 hour  Intake 1204.49 ml  Output 1587 ml  Net -382.51 ml     Physical Exam: General:  Weak  appearing. No resp difficulty HEENT: normal Neck: supple. RIJ ECMO cannula  LIC Trialysis Carotids 2+ bilat; no bruits. No lymphadenopathy or thryomegaly appreciated. Cor: PMI nondisplaced. Regular rate & rhythm. No rubs, gallops or  murmurs. Lungs: coarse Abdomen: obese soft, nontender, nondistended. No hepatosplenomegaly. No bruits or masses. Good bowel sounds. Extremities: no cyanosis, clubbing, rash, 1+ edema + boots Neuro: alert & orientedx3, cranial nerves grossly intact. moves all 4 extremities w/o difficulty. Affect pleasant  Telemetry: Sinus 70-80s Personally reviewed   Labs: Basic Metabolic Panel: Recent Labs  Lab 02/09/20 1604 02/09/20 1611 02/10/20 0322 02/10/20 0353 02/11/20 0322 02/11/20 0333 02/12/20 0405 02/12/20 0516 02/12/20 1130 02/12/20 1625 02/13/20 0314 02/13/20 0315 02/13/20 1611 02/13/20 1723 02/14/20 0249 02/14/20 0406 02/14/20 1600 02/14/20 1603 02/15/20 0407 02/15/20 1207 02/15/20 1311 02/15/20 1426 02/15/20 1614 02/15/20 1947 02/16/20 0436  NA 136   < > 135   < > 138   < > 137   < > 136   < > 137   < > 138   < > 136   < > 138   < > 138   < > 140 140 138 140 139  K 4.3   < > 4.2   < > 4.0   < > 4.0   < > 4.0   < > 3.8   < > 4.0   < > 3.8   < > 4.1   < > 3.8   < > 4.2 4.5 4.2 4.2 3.6  CL 102   < > 102   < > 101   < >  102   < > 101   < > 100   < > 101  --  100  --  101  --  103  --   --   --  104  --   --   CO2 22   < > 22   < > 23   < > 22   < > 22   < > 23   < > 25  --  22  --  21*  --  19*  --   --   --  21*  --   --   GLUCOSE 162*   < > 138*   < > 172*   < > 126*   < > 145*   < > 194*   < > 176*  --  178*  --  153*  --  186*  --   --   --  146*  --   --   BUN 30*   < > 25*   < > 28*   < > 14   < > 16   < > 28*   < > 23*  --  31*  --  47*  --  58*  --   --   --  48*  --   --   CREATININE 1.02*   < > 1.02*   < > 1.06*   < > 0.91   < > 0.96   < > 1.32*   < > 1.30*  --  1.77*  --  2.42*  --  2.93*  --   --   --  2.36*  --   --   CALCIUM 8.4*   < > 8.5*   < > 8.5*   < > 8.5*   < > 8.4*   < > 8.6*   < > 8.2*   < > 8.5*   < > 8.7*  --  8.7*  --   --   --  8.5*  --   --   MG 2.7*  --  2.7*  --  2.7*  --  2.7*  --   --   --  2.9*  --   --   --   --   --   --   --   --   --   --    --   --   --   --   PHOS 4.4   < > 2.5   < > 2.7   < > 2.5   < > 3.6  --  3.7  --   --   --  3.6  --   --   --  5.2*  --   --   --  4.1  --   --    < > = values in this interval not displayed.    Liver Function Tests: Recent Labs  Lab 02/12/20 1130 02/15/20 1614  ALBUMIN 2.5* 2.4*   No results for input(s): LIPASE, AMYLASE in the last 168 hours. No results for input(s): AMMONIA in the last 168 hours.  CBC: Recent Labs  Lab 02/13/20 1611 02/13/20 1723 02/14/20 0249 02/14/20 0406 02/14/20 1600 02/14/20 1603 02/15/20 0407 02/15/20 1207 02/15/20 1311 02/15/20 1426 02/15/20 1614 02/15/20 1947 02/16/20 0436  WBC 13.6*  --  12.5*  --  11.6*  --  9.8  --   --   --  9.9  --   --   HGB 8.7*   < >  8.5*   < > 8.6*   < > 8.2*   < > 10.2* 9.2* 8.7* 8.8* 9.2*  HCT 28.5*   < > 28.2*   < > 28.2*   < > 28.0*   < > 30.0* 27.0* 28.5* 26.0* 27.0*  MCV 110.5*  --  109.3*  --  110.6*  --  111.1*  --   --   --  111.8*  --   --   PLT 94*  --  104*  --  94*  --  95*  --   --   --  100*  --   --    < > = values in this interval not displayed.    Cardiac Enzymes: No results for input(s): CKTOTAL, CKMB, CKMBINDEX, TROPONINI in the last 168 hours.  BNP: BNP (last 3 results) No results for input(s): BNP in the last 8760 hours.  ProBNP (last 3 results) No results for input(s): PROBNP in the last 8760 hours.    Other results:  Imaging: DG CHEST PORT 1 VIEW  Result Date: 02/15/2020 CLINICAL DATA:  Encounter for hemodialysis.  Respiratory distress. EXAM: PORTABLE CHEST 1 VIEW COMPARISON:  Radiograph earlier this day. FINDINGS: New left internal jugular dialysis catheter tip in the upper SVC. No visualized pneumothorax. Right internal jugular ECMO catheter tip in the upper abdomen not entirely included in the field of view. Tracheostomy tube tip at the thoracic inlet. Enteric tube remains in place. Near complete opacification throughout both hemi thoraces with slight improved aeration from prior  exam and increasing ventilation in the perihilar lungs. IMPRESSION: 1. New left internal jugular dialysis catheter tip in the upper SVC. No visualized pneumothorax. 2. Additional support apparatus unchanged. 3. Near complete opacification throughout both lungs with slight improved aeration from prior exam and increasing ventilation in the perihilar lungs. Electronically Signed   By: Keith Rake M.D.   On: 02/15/2020 19:05   DG CHEST PORT 1 VIEW  Result Date: 02/15/2020 CLINICAL DATA:  Tracheostomy. ECMO. Respiratory failure. Recent COVID. EXAM: PORTABLE CHEST 1 VIEW COMPARISON:  02/14/2020. FINDINGS: Tracheostomy tube, NG tube, right PICC line, ECMO device in stable position. Left subclavian line stable position with tip over left subclavian vein. Complete consolidation of both lungs again noted. Heart is obscured. Prior cervical fusion. IMPRESSION: 1.  Lines and tubes stable position. 2. Complete consolidation of both lungs again noted. No interim change. Electronically Signed   By: Marcello Moores  Register   On: 02/15/2020 07:42   DG CHEST PORT 1 VIEW  Result Date: 02/14/2020 CLINICAL DATA:  ECMO EXAM: PORTABLE CHEST 1 VIEW COMPARISON:  Yesterday FINDINGS: Endotracheal tube tip is at the clavicular heads. Bilateral central line in unchanged position. ECMO catheter in stable position. The enteric tube at least reaches the mid stomach. Airless lungs and obscured heart size. No visible air leak. IMPRESSION: 1. Stable hardware positioning. 2. Airless lungs. Electronically Signed   By: Monte Fantasia M.D.   On: 02/14/2020 07:41     Medications:     Scheduled Medications: . amiodarone  200 mg Per Tube Daily  . vitamin C  500 mg Per Tube Daily  . chlorhexidine gluconate (MEDLINE KIT)  15 mL Mouth Rinse BID  . Chlorhexidine Gluconate Cloth  6 each Topical Q0600  . clonazePAM  1 mg Per Tube Q6H  . clotrimazole  1 Applicatorful Vaginal QHS  . docusate  100 mg Per Tube BID  . feeding supplement (PROSource  TF)  45 mL Per Tube QID  .  Gerhardt's butt cream   Topical BID  . linagliptin  5 mg Per Tube Daily  . mouth rinse  15 mL Mouth Rinse 10 times per day  . melatonin  9 mg Per Tube QHS  . midodrine  10 mg Per Tube TID  . multivitamin  1 tablet Per Tube QHS  . oxyCODONE  10 mg Per Tube Q6H  . pantoprazole (PROTONIX) IV  40 mg Intravenous BID  . polyethylene glycol  17 g Per Tube Daily  . QUEtiapine  50 mg Per Tube QHS  . sennosides  5 mL Per Tube BID  . zinc sulfate  220 mg Per Tube Daily    Infusions: .  prismasol BGK 4/2.5 400 mL/hr at 02/16/20 0344  .  prismasol BGK 4/2.5 200 mL/hr at 02/16/20 0345  . sodium chloride 500 mL (02/11/20 2215)  . sodium chloride 10 mL/hr at 02/16/20 0400  . sodium chloride    . sodium chloride    . albumin human 12.5 g (02/12/20 2231)  . albumin human Stopped (02/06/20 2000)  . feeding supplement (PIVOT 1.5 CAL) 20 mL/hr at 02/16/20 0400  . heparin 500 Units/hr (02/16/20 0400)  . insulin 1.2 mL/hr at 02/16/20 0400  . norepinephrine (LEVOPHED) Adult infusion 2 mcg/min (02/16/20 0400)  . phenylephrine (NEO-SYNEPHRINE) Adult infusion Stopped (02/13/20 0155)  . prismasol BGK 4/2.5 2,000 mL/hr at 02/16/20 0344    PRN Medications: sodium chloride, sodium chloride, sodium chloride, sodium chloride, acetaminophen (TYLENOL) oral liquid 160 mg/5 mL, albumin human, albuterol, alteplase, alum & mag hydroxide-simeth, dextrose, fentaNYL (SUBLIMAZE) injection, guaiFENesin-dextromethorphan, heparin, heparin, heparin, hydrALAZINE, HYDROmorphone (DILAUDID) injection, hydrOXYzine, lidocaine (PF), lidocaine-prilocaine, ondansetron **OR** ondansetron (ZOFRAN) IV, pentafluoroprop-tetrafluoroeth   Assessment/Plan:   1. Acute hypoxic/hypercapneic respiratory failure in setting of severe COVID PNA/ARDS -> VV ECMO - admit 8/2 - intubation 8/3 - has received actmera (compelted 8/2), remdesivir (completed 8/6) and steroids - Cannulated for VV ECMO on 8/9 - Extubated 8/16.  Reintubated 8/24 - s/p trach 8/30. - Circuit changed 8/24 due to concern for hemolysis and worsening oxygenation. - CVVHD started 8/22 for volume removal and uremia. Remains anuric.CXR worse on 9/9 so switched back to CVVHD - Remains on low-dose heparin. No bleeding. Discussed dosing with PharmD personally. - Off IV sedation. Continue melatonin and seroquel for sleep at night  - On low-dose NE and midodrine 10 tid in setting of wide pulse pressure. Concern for aortic valve insufficiency with possible previous vegetation. Echo 9/7 no AI  - CXR pending this am.  - Main issues remain to be poor lung recovery, severe myopathy and AKI. Continue slow wean as tolerated. For PEG-J today with IR. Will need to hold heparin   2. Enterococcus sepsis - Finished high-dose zosyn on 9/7 per ID - Eraxis added on 8/26 with yeast in BAL 8/24 (ended 9/2)  - Cultures repeated with increasing pressor demands - F/u cx 9/7 Remain NGTD.   3. Intracranial hemorrhage - ? Septic emboli - repeat head CT on 8/14 with stable bleeds but increased edema - neurology has seen. Suspect significant long-term injury sustained. Will follow commands. Appears to have dense LUE weakness and possibly LLE - repeat head CT stable 8/16 - tolerating low-dose heparin  4. Thrombocytopenia - PLTs ~95k-105K  - Continue to follow. No change  5. Morbid obesity - Body mass index is Body mass index is 45.86 kg/m.  6. Poorly controlled DM2 - HgBA1c 10.7 - On insulin gtt  7. PAF - intermittent episodes. Last 8/26 - Now  on po. Quiescent  8. AKI/azotemia - CVVHD started on 8/22 - Down 70 pounds.  - transitioned to [facility] on 9/7 - switched back to CVVHD for better volume removal on 9/9. - new catheter placed 9/9 - will eventually need tunneled access  9. Emesis - Cor-trak out. TFs at 20 due to emesis  - Needs PEG-J. IR consulted - plan today - Hold heparin prior  10. Sacral decub  - Wound care following  CRITICAL  CARE Performed by: Glori Bickers  Total critical care time: 35 minutes  Critical care time was exclusive of separately billable procedures and treating other patients.  Critical care was necessary to treat or prevent imminent or life-threatening deterioration.  Critical care was time spent personally by me (independent of midlevel providers or residents) on the following activities: development of treatment plan with patient and/or surrogate as well as nursing, discussions with consultants, evaluation of patient's response to treatment, examination of patient, obtaining history from patient or surrogate, ordering and performing treatments and interventions, ordering and review of laboratory studies, ordering and review of radiographic studies, pulse oximetry and re-evaluation of patient's condition.   Length of Stay: [place]  MD 02/16/2020, 4:46 AM  Advanced Heart Failure Team Pager [phone] (M-F; 7a - 4p)  Please contact [facility] for night-coverage after hours (4p -7a ) and weekends on [email or web address]

## 2020-02-17 ENCOUNTER — Inpatient Hospital Stay (HOSPITAL_COMMUNITY): Payer: PRIVATE HEALTH INSURANCE

## 2020-02-17 DIAGNOSIS — Z930 Tracheostomy status: Secondary | ICD-10-CM

## 2020-02-17 DIAGNOSIS — G729 Myopathy, unspecified: Secondary | ICD-10-CM

## 2020-02-17 LAB — POCT I-STAT 7, (LYTES, BLD GAS, ICA,H+H)
Acid-Base Excess: 3 mmol/L — ABNORMAL HIGH (ref 0.0–2.0)
Acid-Base Excess: 4 mmol/L — ABNORMAL HIGH (ref 0.0–2.0)
Acid-Base Excess: 2 mmol/L (ref 0.0–2.0)
Acid-Base Excess: 1 mmol/L (ref 0.0–2.0)
Acid-Base Excess: 1 mmol/L (ref 0.0–2.0)
Acid-Base Excess: 0 mmol/L (ref 0.0–2.0)
Bicarbonate: 26.5 mmol/L (ref 20.0–28.0)
Bicarbonate: 27.5 mmol/L (ref 20.0–28.0)
Bicarbonate: 28.2 mmol/L — ABNORMAL HIGH (ref 20.0–28.0)
Bicarbonate: 26.7 mmol/L (ref 20.0–28.0)
Bicarbonate: 26.2 mmol/L (ref 20.0–28.0)
Bicarbonate: 25 mmol/L (ref 20.0–28.0)
Calcium, Ion: 1.13 mmol/L — ABNORMAL LOW (ref 1.15–1.40)
Calcium, Ion: 1.15 mmol/L (ref 1.15–1.40)
Calcium, Ion: 1.16 mmol/L (ref 1.15–1.40)
Calcium, Ion: 1.14 mmol/L — ABNORMAL LOW (ref 1.15–1.40)
Calcium, Ion: 1.17 mmol/L (ref 1.15–1.40)
Calcium, Ion: 1.16 mmol/L (ref 1.15–1.40)
HCT: 24 % — ABNORMAL LOW (ref 36.0–46.0)
HCT: 25 % — ABNORMAL LOW (ref 36.0–46.0)
HCT: 29 % — ABNORMAL LOW (ref 36.0–46.0)
HCT: 30 % — ABNORMAL LOW (ref 36.0–46.0)
HCT: 28 % — ABNORMAL LOW (ref 36.0–46.0)
HCT: 24 % — ABNORMAL LOW (ref 36.0–46.0)
Hemoglobin: 8.2 g/dL — ABNORMAL LOW (ref 12.0–15.0)
Hemoglobin: 8.5 g/dL — ABNORMAL LOW (ref 12.0–15.0)
Hemoglobin: 9.9 g/dL — ABNORMAL LOW (ref 12.0–15.0)
Hemoglobin: 10.2 g/dL — ABNORMAL LOW (ref 12.0–15.0)
Hemoglobin: 9.5 g/dL — ABNORMAL LOW (ref 12.0–15.0)
Hemoglobin: 8.2 g/dL — ABNORMAL LOW (ref 12.0–15.0)
O2 Saturation: 95 %
O2 Saturation: 98 %
O2 Saturation: 96 %
O2 Saturation: 85 %
O2 Saturation: 93 %
O2 Saturation: 89 %
Patient temperature: 98.6
Patient temperature: 98.6
Patient temperature: 97.8
Patient temperature: 97.6
Patient temperature: 98.7
Patient temperature: 99.2
Potassium: 3.8 mmol/L (ref 3.5–5.1)
Potassium: 3.8 mmol/L (ref 3.5–5.1)
Potassium: 4.2 mmol/L (ref 3.5–5.1)
Potassium: 4.4 mmol/L (ref 3.5–5.1)
Potassium: 4.2 mmol/L (ref 3.5–5.1)
Potassium: 4 mmol/L (ref 3.5–5.1)
Sodium: 138 mmol/L (ref 135–145)
Sodium: 139 mmol/L (ref 135–145)
Sodium: 137 mmol/L (ref 135–145)
Sodium: 137 mmol/L (ref 135–145)
Sodium: 138 mmol/L (ref 135–145)
Sodium: 139 mmol/L (ref 135–145)
TCO2: 28 mmol/L (ref 22–32)
TCO2: 29 mmol/L (ref 22–32)
TCO2: 30 mmol/L (ref 22–32)
TCO2: 28 mmol/L (ref 22–32)
TCO2: 28 mmol/L (ref 22–32)
TCO2: 26 mmol/L (ref 22–32)
pCO2 arterial: 36.6 mmHg (ref 32.0–48.0)
pCO2 arterial: 36.9 mmHg (ref 32.0–48.0)
pCO2 arterial: 51.8 mmHg — ABNORMAL HIGH (ref 32.0–48.0)
pCO2 arterial: 46.5 mmHg (ref 32.0–48.0)
pCO2 arterial: 46.1 mmHg (ref 32.0–48.0)
pCO2 arterial: 44.2 mmHg (ref 32.0–48.0)
pH, Arterial: 7.464 — ABNORMAL HIGH (ref 7.350–7.450)
pH, Arterial: 7.484 — ABNORMAL HIGH (ref 7.350–7.450)
pH, Arterial: 7.342 — ABNORMAL LOW (ref 7.350–7.450)
pH, Arterial: 7.365 (ref 7.350–7.450)
pH, Arterial: 7.362 (ref 7.350–7.450)
pH, Arterial: 7.363 (ref 7.350–7.450)
pO2, Arterial: 71 mmHg — ABNORMAL LOW (ref 83.0–108.0)
pO2, Arterial: 95 mmHg (ref 83.0–108.0)
pO2, Arterial: 83 mmHg (ref 83.0–108.0)
pO2, Arterial: 51 mmHg — ABNORMAL LOW (ref 83.0–108.0)
pO2, Arterial: 72 mmHg — ABNORMAL LOW (ref 83.0–108.0)
pO2, Arterial: 60 mmHg — ABNORMAL LOW (ref 83.0–108.0)

## 2020-02-17 LAB — TYPE AND SCREEN
ABO/RH(D): O POS
Antibody Screen: NEGATIVE
Unit division: 0
Unit division: 0
Unit division: 0
Unit division: 0

## 2020-02-17 LAB — BPAM RBC
Blood Product Expiration Date: 202110082359
Blood Product Expiration Date: 202110082359
Blood Product Expiration Date: 202110082359
Blood Product Expiration Date: 202110082359
ISSUE DATE / TIME: 202109101410
ISSUE DATE / TIME: 202109101410
Unit Type and Rh: 5100
Unit Type and Rh: 5100
Unit Type and Rh: 5100
Unit Type and Rh: 5100

## 2020-02-17 LAB — RENAL FUNCTION PANEL
Albumin: 2.2 g/dL — ABNORMAL LOW (ref 3.5–5.0)
Albumin: 2.9 g/dL — ABNORMAL LOW (ref 3.5–5.0)
Anion gap: 11 (ref 5–15)
Anion gap: 12 (ref 5–15)
BUN: 19 mg/dL (ref 6–20)
BUN: 17 mg/dL (ref 6–20)
CO2: 22 mmol/L (ref 22–32)
CO2: 23 mmol/L (ref 22–32)
Calcium: 8 mg/dL — ABNORMAL LOW (ref 8.9–10.3)
Calcium: 8.5 mg/dL — ABNORMAL LOW (ref 8.9–10.3)
Chloride: 103 mmol/L (ref 98–111)
Chloride: 101 mmol/L (ref 98–111)
Creatinine, Ser: 1.29 mg/dL — ABNORMAL HIGH (ref 0.44–1.00)
Creatinine, Ser: 1.21 mg/dL — ABNORMAL HIGH (ref 0.44–1.00)
GFR calc Af Amer: 54 mL/min — ABNORMAL LOW (ref >60)
GFR calc Af Amer: 58 mL/min — ABNORMAL LOW (ref >60)
GFR calc non Af Amer: 47 mL/min — ABNORMAL LOW (ref >60)
GFR calc non Af Amer: 50 mL/min — ABNORMAL LOW (ref >60)
Glucose, Bld: 97 mg/dL (ref 70–99)
Glucose, Bld: 153 mg/dL — ABNORMAL HIGH (ref 70–99)
Phosphorus: 2.1 mg/dL — ABNORMAL LOW (ref 2.5–4.6)
Phosphorus: 2.9 mg/dL (ref 2.5–4.6)
Potassium: 3.5 mmol/L (ref 3.5–5.1)
Potassium: 4.3 mmol/L (ref 3.5–5.1)
Sodium: 136 mmol/L (ref 135–145)
Sodium: 136 mmol/L (ref 135–145)

## 2020-02-17 LAB — MAGNESIUM: Magnesium: 2.5 mg/dL — ABNORMAL HIGH (ref 1.7–2.4)

## 2020-02-17 LAB — GLUCOSE, CAPILLARY
Glucose-Capillary: 104 mg/dL — ABNORMAL HIGH (ref 70–99)
Glucose-Capillary: 108 mg/dL — ABNORMAL HIGH (ref 70–99)
Glucose-Capillary: 123 mg/dL — ABNORMAL HIGH (ref 70–99)
Glucose-Capillary: 129 mg/dL — ABNORMAL HIGH (ref 70–99)
Glucose-Capillary: 139 mg/dL — ABNORMAL HIGH (ref 70–99)
Glucose-Capillary: 140 mg/dL — ABNORMAL HIGH (ref 70–99)
Glucose-Capillary: 143 mg/dL — ABNORMAL HIGH (ref 70–99)
Glucose-Capillary: 145 mg/dL — ABNORMAL HIGH (ref 70–99)
Glucose-Capillary: 150 mg/dL — ABNORMAL HIGH (ref 70–99)
Glucose-Capillary: 151 mg/dL — ABNORMAL HIGH (ref 70–99)
Glucose-Capillary: 157 mg/dL — ABNORMAL HIGH (ref 70–99)
Glucose-Capillary: 160 mg/dL — ABNORMAL HIGH (ref 70–99)
Glucose-Capillary: 162 mg/dL — ABNORMAL HIGH (ref 70–99)
Glucose-Capillary: 165 mg/dL — ABNORMAL HIGH (ref 70–99)
Glucose-Capillary: 85 mg/dL (ref 70–99)
Glucose-Capillary: 96 mg/dL (ref 70–99)
Glucose-Capillary: 96 mg/dL (ref 70–99)

## 2020-02-17 LAB — LACTATE DEHYDROGENASE: LDH: 303 U/L — ABNORMAL HIGH (ref 98–192)

## 2020-02-17 LAB — BASIC METABOLIC PANEL
Anion gap: 12 (ref 5–15)
BUN: 16 mg/dL (ref 6–20)
CO2: 23 mmol/L (ref 22–32)
Calcium: 8.7 mg/dL — ABNORMAL LOW (ref 8.9–10.3)
Chloride: 100 mmol/L (ref 98–111)
Creatinine, Ser: 1.29 mg/dL — ABNORMAL HIGH (ref 0.44–1.00)
GFR calc Af Amer: 54 mL/min — ABNORMAL LOW (ref >60)
GFR calc non Af Amer: 47 mL/min — ABNORMAL LOW (ref >60)
Glucose, Bld: 145 mg/dL — ABNORMAL HIGH (ref 70–99)
Potassium: 4 mmol/L (ref 3.5–5.1)
Sodium: 135 mmol/L (ref 135–145)

## 2020-02-17 LAB — CBC
HCT: 27 % — ABNORMAL LOW (ref 36.0–46.0)
HCT: 28.5 % — ABNORMAL LOW (ref 36.0–46.0)
Hemoglobin: 8 g/dL — ABNORMAL LOW (ref 12.0–15.0)
Hemoglobin: 8.5 g/dL — ABNORMAL LOW (ref 12.0–15.0)
MCH: 33.6 pg (ref 26.0–34.0)
MCH: 33.1 pg (ref 26.0–34.0)
MCHC: 29.6 g/dL — ABNORMAL LOW (ref 30.0–36.0)
MCHC: 29.8 g/dL — ABNORMAL LOW (ref 30.0–36.0)
MCV: 113.4 fL — ABNORMAL HIGH (ref 80.0–100.0)
MCV: 110.9 fL — ABNORMAL HIGH (ref 80.0–100.0)
Platelets: 107 10*3/uL — ABNORMAL LOW (ref 150–400)
Platelets: 130 10*3/uL — ABNORMAL LOW (ref 150–400)
RBC: 2.38 MIL/uL — ABNORMAL LOW (ref 3.87–5.11)
RBC: 2.57 MIL/uL — ABNORMAL LOW (ref 3.87–5.11)
RDW: 28.1 % — ABNORMAL HIGH (ref 11.5–15.5)
RDW: 26.6 % — ABNORMAL HIGH (ref 11.5–15.5)
WBC: 7 10*3/uL (ref 4.0–10.5)
WBC: 13.6 10*3/uL — ABNORMAL HIGH (ref 4.0–10.5)
nRBC: 0.7 % — ABNORMAL HIGH (ref 0.0–0.2)
nRBC: 0.2 % (ref 0.0–0.2)

## 2020-02-17 LAB — APTT
aPTT: 191 seconds (ref 24–36)
aPTT: 36 seconds (ref 24–36)
aPTT: 36 seconds (ref 24–36)

## 2020-02-17 LAB — HEPARIN LEVEL (UNFRACTIONATED)
Heparin Unfractionated: 0.67 IU/mL (ref 0.30–0.70)
Heparin Unfractionated: <0.1 IU/mL — ABNORMAL LOW (ref 0.30–0.70)

## 2020-02-17 LAB — FIBRINOGEN: Fibrinogen: 416 mg/dL (ref 210–475)

## 2020-02-17 LAB — LACTIC ACID, PLASMA
Lactic Acid, Venous: 1.2 mmol/L (ref 0.5–1.9)
Lactic Acid, Venous: 1.1 mmol/L (ref 0.5–1.9)

## 2020-02-17 MED ORDER — ALBUMIN HUMAN 25 % IV SOLN
12.5000 g | Freq: Once | INTRAVENOUS | Status: AC
  Administered 2020-02-17: 12.5 g via INTRAVENOUS
  Filled 2020-02-17: qty 50

## 2020-02-17 MED ORDER — PIVOT 1.5 CAL PO LIQD: 1000.0000 mL | ORAL | Status: DC

## 2020-02-17 MED ORDER — PANCRELIPASE (LIP-PROT-AMYL) 10440-39150 UNITS PO TABS
20880.0000 [IU] | ORAL_TABLET | Freq: Once | ORAL | Status: DC
  Filled 2020-02-17: qty 2

## 2020-02-17 MED ORDER — PIVOT 1.5 CAL PO LIQD
1000.0000 mL | ORAL | Status: DC
  Administered 2020-02-17 – 2020-03-04 (×21): 1000 mL via JEJUNOSTOMY
  Filled 2020-02-17 (×27): qty 1000

## 2020-02-17 MED ORDER — SODIUM BICARBONATE 650 MG PO TABS
650.0000 mg | ORAL_TABLET | Freq: Once | ORAL | Status: DC
  Filled 2020-02-17: qty 1

## 2020-02-17 NOTE — Progress Notes (Signed)
NAME:  Alisha Stephenson, MRN:  [id number], DOB:  1965-12-01, LOS: 19 ADMISSION DATE:  01/21/2020, CONSULTATION DATE:  8/3 REFERRING MD:  Alfredia Ferguson, CHIEF COMPLAINT:  Dyspnea   Brief History   54 y/o female admitted on 8/2 with severe acute respiratory failure with hypoxemia due to COVID 19 pneumonia.  She developed symptoms 1 week prior to admission.  Past Medical History  DM2 Diverticulitis Gallstones Ovarian cyst NAFLD Asthma  Significant Hospital Events   8/2 admit 8/3 ICU transfer, intubated 8/4 prone, paralyze 8/9 significant desaturations today 8/9 VV ECMO cannulation 8/13 ICH 8/30 trach  Consults:  PCCM ECMO team  Procedures:  8/3 ETT > 8/30 8/30 8-0 shiley trach 8/3 PICC >  8/9 LIJ MML 8/9 RIJ [place] 79F  9/8 trialysis 9/10 PEG-J (IR)  Significant Diagnostic Tests:  7/31 CT head > NAICP 7/31 MRI/MRA brain > no acute changes, possibly small aneurysm ACOM 8/13 CT head> multiple areas of ICH 9/9 CXR- significantly worse infiltrates BL  Micro Data:  8/2 blood > NG 8/2 SARS COV 2 > positive 8/4 resp > negative 8/4 urine >  8/12 blood > E. faecalis (pan-sensitive) 8/12 resp: staph aureus> MSSA 8/25 blood>> NG 8/24 BAL> yeast  Antimicrobials:  See fever tab for past abx Current: none  Interim history/subjective:  IR PEG-J yesterday.  Objective   Blood pressure (!) 145/55, pulse (!) 108, temperature 98.6 F (37 C), resp. rate 15, height _0  (1.626 m), weight 115.4 kg, SpO2 (!) 88 %.    Vent Mode: PCV FiO2 (%):  [40 %] 40 % Set Rate:  [15 bmp] 15 bmp PEEP:  [10 cmH20] 10 cmH20 Plateau Pressure:  [20 cmH20-23 cmH20] 20 cmH20   Intake/Output Summary (Last 24 hours) at 02/17/2020 0701 Last data filed at 02/17/2020 0700 Gross per 24 hour  Intake 289.16 ml  Output 1908 ml  Net -1618.84 ml   Filed Weights   02/15/20 0500 02/16/20 0452 02/17/20 0500  Weight: 121.2 kg 119.8 kg 115.4 kg    Examination:  GEN: critically ill appearing woman sitting  up in bed in NAD HEENT: [place]/AT, eyes anicteric Neck: Tracheostomy, stoma without bleeding or significant drainage CV: Regular rate and rhythm PULM: Diminished breath sounds bilaterally, rales.  Tidal volume ~150 cc GI: obese, soft, PEG in place EXT: improving edema, necrotic right distal ring finger and thumb NEURO: awake and alert, more interactive and mobile today. Able to wiggle toes with more strength, 1/5 strength with elbow flexion on R. PSYCH: RASS= 0, cooperative with exam Derm: No erythema around lines  Pressure Injury 02/14/20 Anus Medial Deep Tissue Pressure Injury - Purple or maroon localized area of discolored intact skin or blood-filled blister due to damage of underlying soft tissue from pressure and/or shear. (Active)  02/14/20 1300  Location: Anus  Location Orientation: Medial  Staging: Deep Tissue Pressure Injury - Purple or maroon localized area of discolored intact skin or blood-filled blister due to damage of underlying soft tissue from pressure and/or shear.  Wound Description (Comments):   Present on Admission: No     ABG paO2 71 BMP stable on CRRT CBC stable Blood culture 9/7 NGTD Fibrinogen 547>>571>>481>>464>  431 >416 LDH 489>>401>>376>>326> 321> 303 PTT 36 Net - 1.6 L  Vent rate 15, pressure control 12, PEEP 10, 40% FiO2 with tidal volumes about 150  CXR personally reviewed: slowly improving lung consolidations, ECMO cannula, trialysis, trach in place.  Resolved Hospital Problem list   MSSA pneumonia Fungal pneumonia- Candida dublinensis Enterococcal  bacteremia-recent blood cultures cleared Concern for possible embolic phenomenon- fingers and possibly CVAs were embolic  Assessment & Plan:    Critically ill due to acute hypoxic and AoC hypercapneic respiratory failure; likely underly OHS.  Requiring VV ECMO support and mechanical ventilation.  ARDS due to COVID 19 pneumonia Tracheostomy in place -Con't VV ECMO support. Sweep titration downward  to con't efforts at ECMO liberation. -Con't ultra-lung protective ventilation.  -VAP prevention protocol. -PRN sedation; limit as much as possible. -Routine tracheostomy care -off all antibiotics and monitoring  Elevated LDH, hypo-fibrinogenemia and thrombocytopenia--improved since circuit change. -Con't routine ECMO labs  ICH, multifocal. L-sided weakness.  Critical illness myopathy--improved today. -Continue PT, OT  Acute metabolic encephalopathy, improving -Minimize sedation as much as possible -Continue melatonin and seroquel to facilitate normal sleep-wake cycles -Continue physical therapy, Occupational Therapy -Family visitation likely has been helping  Acute renal failure, severe uremia.  Anuric. -Continue CRRT.  Appreciate nephrology's assistance. Goal net negative. -Renally dose meds and avoid nephrotoxic meds -Strict I's/O -bladder scan Q shift.  Shock related to RV strain - now resolved -Con't midodrine.  Hyperglycemia > not controlled at all with [employer] insulin despite aggressive upward titration.  Assume poor absorption, potentially due to subcutaneous edema.   -Improved on IV; when euvolemic may be able to tolerate [place] insulin. Will attempt when back on regular TF dose  Best practice:  Diet: tube feeding Pain/Anxiety/Delirium protocol (if indicated): as above VAP protocol (if indicated): yes DVT prophylaxis: SCDs , heparin fixed dose GI prophylaxis: pantoprazole Glucose control:  insulin infusion  Mobility: bed rest Code Status: limited Family Communication: husband updated by team daily Disposition: ICU  This patient is critically ill with multiple organ system failure which requires frequent high complexity decision making, assessment, support, evaluation, and titration of therapies. This was completed through the application of advanced monitoring technologies and extensive interpretation of multiple databases. During this encounter critical care time was  devoted to patient care services described in this note for 40 minutes.  Julian Hy, MD 02/17/20 9:21 PM [facility]

## 2020-02-17 NOTE — Procedures (Signed)
Arterial Catheter Insertion Procedure Note  Alisha Stephenson  [id number]  April 21, 1966  Date:02/17/20  Time:2:46 PM    Provider Performing: Clementeen Graham    Procedure: Insertion of Arterial Line [phone]) with US guidance ([id number])   Indication(s) Blood pressure monitoring and/or need for frequent ABGs  Consent Risks of the procedure as well as the alternatives and risks of each were explained to the patient and/or caregiver.  Consent for the procedure was obtained and is signed in the bedside chart  Anesthesia None   Time Out Verified patient identification, verified procedure, site/side was marked, verified correct patient position, special equipment/implants available, medications/allergies/relevant history reviewed, required imaging and test results available.   Sterile Technique Maximal sterile technique including full sterile barrier drape, hand hygiene, sterile gown, sterile gloves, mask, hair covering, sterile ultrasound probe cover (if used).   Procedure Description Area of catheter insertion was cleaned with chlorhexidine and draped in sterile fashion. With real-time ultrasound guidance an arterial catheter was placed into the right radial artery.  Appropriate arterial tracings confirmed on monitor.     Complications/Tolerance None; patient tolerated the procedure well.   EBL Minimal   Specimen(s) None  Alisha Stephenson ACNP-BC [place] Pager # [phone] OR # [phone] if no answer

## 2020-02-17 NOTE — Progress Notes (Signed)
S/p perc G-J yesterday. Called to assess for clogged J-limb already. RN states J-limb has not been used yet.  Tube intact, site clean. G-limb open and flushes easily. J-limb occluded. Successful unclogged with 55mL syringe using blue taper tip. Flushed easily multiple times thereafter. Left 60mL/blue tip at bedside in case needed again. Rn reminded to flush both limbs diligently to prevent occlusion.  Ascencion Dike PA-C Interventional Radiology 02/17/2020 1:45 PM

## 2020-02-17 NOTE — Progress Notes (Signed)
   Palliative Medicine Inpatient Follow Up Note   Reason for Consultation: Establishing goals of care  HPI/Patient Profile: 54 y.o. female  with past medical history of DM type 2, asthma, diverticulitis, and NAFLD.  She tested positive for Covid-19 on 12/31/19 and at that time was having symptoms of diarrhea, cough, and congestion. She presented to the [facility] ED on 01/06/20 with headache, MRI was negative and she was sent home. She presented again to the [facility] ED on 01/23/2020 with dyspnea. In the ED, she required high flow oxygen to maintain O2 saturation, chest x-ray showed infiltrates concerning for pneumonia. She was started on actemra, steroids, and remdesivir; admitted to [facility]. Her respiratory status rapidly declined requiring intubation. She developed severe ARDS secondary to Covid pneumonia, requiring prone positioning, paralytics, and VV ECMO. Hospital course complicated by multi-system organ failure (thrombocytopenia, AKI, shock liver) as well as enterococcal bacteremia.   Palliative care was consulted to discuss goals of care in the setting of being on ECMO. Have offered ongoing support throughout hospitalization.   Today's Discussion (02/17/2020): Chart reviewed. I met with Zury and her bedside RN, Madison at bedside. Veronia states that her breathing in troubling to her today. I spoke to the [facility] specialist, Misha who shares that they are pursuing sweep trials presently which is likely a contributing factor. Patient noted to have ischemia of right hand digits - vascular surgery will be consulted for this.   I reached out to patients husband, Vernon this morning to offer support. He states that she is doing fairly and remains optimistic for future improvements. He expressed feeling of frustration that perhaps the CRRT prior had been DC'd too early and that is which she had such a rapid re-accumulation of fluid and the need for re-initiation. I offered support through  listening. He states that he had spoken to Dr. Bensimhon who provided him with reassurance regarding Yameli's state.  Vernon states that he does not want to stop anything too quickly as Charlottie is older and it will "take her longer" to improve.   Discussed the importance of continued conversation with family and their  medical providers regarding overall plan of care and treatment options, ensuring decisions are within the context of the patients values and GOCs.  Questions and concerns addressed   SUMMARY OF RECOMMENDATIONS   - Continue current interventions and support  - PMT will continue to follow and check in with family incrementally  Time Spent: 25 Greater than 50% of the time was spent in counseling and coordination of care ______________________________________________________________________________________   [place] Pall[facility] Team Team Cell Phone: [phone] Please utilize secure chat with additional questions, if there is no response within 30 minutes please call the above phone number  Palliative Medicine Team providers are available by phone from 7am to 7pm daily and can be reached through the team cell phone.  Should this patient require assistance outside of these hours, please call the patient's attending physician.     

## 2020-02-17 NOTE — Progress Notes (Addendum)
[facility] Progress Note    Assessment/ Plan:   1.  Anuric AKI due to shock, sepsis causing acute tubular necrosis:CRRT on 8/22- 02/12/20. IHD 02/13/20.  Planned for 9/9 but will go back on CRRT d/t fluid overload.  All 4K, no heparin.  L IJ nontunneled catheter changed 9/9.  Pull net neg 50 mL/ hr.    2.Shock due to sepsis, Covid infection, blood cultures positive for Enterococcus bacteremia: s/p Zosyn (ended 9/7), cultures repeated 9/7.  TTE 9/7 no AI, done for concern of wide pulse pressure  3.Acute hypoxic and hypercapnic respiratory failure, ARDS due to COVID-19 pneumonia: Currently on VV ECMO for refractory hypoxemia.  3.Hypernatremia, hypervolemia: relative free water deficit. Improved with CRRT.  4.Acute toxic metabolic encephalopathy:Multifactorial, improving  5.Anemia, thrombocytopenia: With hemolysis likely and acute illness contributing. Transfusions per primary team.  6.Intracranial hemorrhage: With left-sided weakness, ? Septic emboli  7.  Diabetes per primary  8.  Hypophosphatemia improved  9.  Critical illness myopathy: working on PT/ standing  Subjective:    Continues to make some small improvements.  Got GJ tube yesterday.   Objective:   BP (!) 145/55    Pulse 93    Temp 98.6 F (37 C)    Resp 15    Ht 5' 4"  (1.626 m)    Wt 115.4 kg    SpO2 94%    BMI 43.67 kg/m   Intake/Output Summary (Last 24 hours) at 02/17/2020 6160 Last data filed at 02/17/2020 0800 Gross per 24 hour  Intake 287.86 ml  Output 1898 ml  Net -1610.14 ml   Weight change: -4.4 kg  Physical Exam: Gen: critically ill, lying in bed NECK: R IJ ECMO access CVS: RRR Resp: mech bilaterally Abd: obese Ext: 1+ edema bilaterally ACCESS: L IJ nontunneled HD catheter  Imaging: IR GASTROSTOMY TUBE MOD SED  Result Date: 02/16/2020 INDICATION: 54 year old female currently on ECMO and at risk for aspiration. She requires percutaneous gastrojejunostomy access to allow  for enteral feeding. EXAM: Fluoroscopically guided placement of percutaneous pull-through gastrostomy tube Conversion to gastrojejunostomy tube Interventional Radiologist:  Criselda Peaches, MD MEDICATIONS: 2 g Ancef; Antibiotics were administered within 1 hour of the procedure. ANESTHESIA/SEDATION: Versed 2 mg IV; Fentanyl 50 mcg IV Moderate Sedation Time:  22 minutes The patient was continuously monitored during the procedure by the interventional radiology nurse under my direct supervision. CONTRAST:  23m OMNIPAQUE IOHEXOL 300 MG/ML  SOLN FLUOROSCOPY TIME:  Fluoroscopy Time: 8 minutes 42 seconds (145 mGy). COMPLICATIONS: None immediate. PROCEDURE: Informed written consent was obtained from the patient after a thorough discussion of the procedural risks, benefits and alternatives. All questions were addressed. Maximal Sterile Barrier Technique was utilized including caps, mask, sterile gowns, sterile gloves, sterile drape, hand hygiene and skin antiseptic. A timeout was performed prior to the initiation of the procedure. Maximal barrier sterile technique utilized including caps, mask, sterile gowns, sterile gloves, large sterile drape, hand hygiene, and chlorhexadine skin prep. An angled catheter was advanced over a wire under fluoroscopic guidance through the nose, down the esophagus and into the body of the stomach. The stomach was then insufflated with several 100 ml of air. Fluoroscopy confirmed location of the gastric bubble, as well as inferior displacement of the barium stained colon. Under direct fluoroscopic guidance, a single T-tack was placed, and the anterior gastric wall drawn up against the anterior abdominal wall. Percutaneous access was then obtained into the mid gastric body with an 18 gauge sheath needle. Aspiration of air, and  injection of contrast material under fluoroscopy confirmed needle placement. An Amplatz wire was advanced in the gastric body and the access needle exchanged for a  9-French vascular sheath. A snare device was advanced through the vascular sheath and an Amplatz wire advanced through the angled catheter. The Amplatz wire was successfully snared and this was pulled up through the esophagus and out the mouth. A 20-French Alinda Dooms MIC-PEG tube was then connected to the snare and pulled through the mouth, down the esophagus, into the stomach and out to the anterior abdominal wall. Hand injection of contrast material confirmed intragastric location. The T-tack retention suture was then cut. A C2 cobra catheter and Glidewire were then advanced coaxially through the gastrostomy tube into the stomach. The catheter and wire combination was successfully advanced through the pylorus and into the proximal small bowel. A 9 French jejunal limb was then advanced over the wire and position with the tip in the proximal jejunum. A gentle hand injection of contrast material was performed confirming the location of the tube. The external bumper was secured in place. The tube was flushed through the jejunal and gastric lumen and both were capped. The patient will be observed for several hours with the newly placed tube on low wall suction to evaluate for any post procedure complication. The patient tolerated the procedure well, there is no immediate complication. IMPRESSION: Successful placement of a 20 French pull through gastrostomy tube converted to a gastro jejunostomy tumor with the addition of a 9 [place] jejunal arm. The J arm tip is in the proximal jejunum and ready for use. Electronically Signed   By: Jacqulynn Cadet M.D.   On: 02/16/2020 16:41   IR GASTR TUBE CONVERT GASTR-JEJ PER W/FL MOD SED  Result Date: 02/16/2020 INDICATION: 54 year old female currently on ECMO and at risk for aspiration. She requires percutaneous gastrojejunostomy access to allow for enteral feeding. EXAM: Fluoroscopically guided placement of percutaneous pull-through gastrostomy tube Conversion to  gastrojejunostomy tube Interventional Radiologist:  Criselda Peaches, MD MEDICATIONS: 2 g Ancef; Antibiotics were administered within 1 hour of the procedure. ANESTHESIA/SEDATION: Versed 2 mg IV; Fentanyl 50 mcg IV Moderate Sedation Time:  22 minutes The patient was continuously monitored during the procedure by the interventional radiology nurse under my direct supervision. CONTRAST:  48m OMNIPAQUE IOHEXOL 300 MG/ML  SOLN FLUOROSCOPY TIME:  Fluoroscopy Time: 8 minutes 42 seconds (145 mGy). COMPLICATIONS: None immediate. PROCEDURE: Informed written consent was obtained from the patient after a thorough discussion of the procedural risks, benefits and alternatives. All questions were addressed. Maximal Sterile Barrier Technique was utilized including caps, mask, sterile gowns, sterile gloves, sterile drape, hand hygiene and skin antiseptic. A timeout was performed prior to the initiation of the procedure. Maximal barrier sterile technique utilized including caps, mask, sterile gowns, sterile gloves, large sterile drape, hand hygiene, and chlorhexadine skin prep. An angled catheter was advanced over a wire under fluoroscopic guidance through the nose, down the esophagus and into the body of the stomach. The stomach was then insufflated with several 100 ml of air. Fluoroscopy confirmed location of the gastric bubble, as well as inferior displacement of the barium stained colon. Under direct fluoroscopic guidance, a single T-tack was placed, and the anterior gastric wall drawn up against the anterior abdominal wall. Percutaneous access was then obtained into the mid gastric body with an 18 gauge sheath needle. Aspiration of air, and injection of contrast material under fluoroscopy confirmed needle placement. An Amplatz wire was advanced in the gastric body  and the access needle exchanged for a 9-French vascular sheath. A snare device was advanced through the vascular sheath and an Amplatz wire advanced through the  angled catheter. The Amplatz wire was successfully snared and this was pulled up through the esophagus and out the mouth. A 20-French Alinda Dooms MIC-PEG tube was then connected to the snare and pulled through the mouth, down the esophagus, into the stomach and out to the anterior abdominal wall. Hand injection of contrast material confirmed intragastric location. The T-tack retention suture was then cut. A C2 cobra catheter and Glidewire were then advanced coaxially through the gastrostomy tube into the stomach. The catheter and wire combination was successfully advanced through the pylorus and into the proximal small bowel. A 9 French jejunal limb was then advanced over the wire and position with the tip in the proximal jejunum. A gentle hand injection of contrast material was performed confirming the location of the tube. The external bumper was secured in place. The tube was flushed through the jejunal and gastric lumen and both were capped. The patient will be observed for several hours with the newly placed tube on low wall suction to evaluate for any post procedure complication. The patient tolerated the procedure well, there is no immediate complication. IMPRESSION: Successful placement of a 20 French pull through gastrostomy tube converted to a gastro jejunostomy tumor with the addition of a 9 [place] jejunal arm. The J arm tip is in the proximal jejunum and ready for use. Electronically Signed   By: Jacqulynn Cadet M.D.   On: 02/16/2020 16:41   DG CHEST PORT 1 VIEW  Result Date: 02/16/2020 CLINICAL DATA:  COVID-19 infection on ECMO EXAM: PORTABLE CHEST 1 VIEW COMPARISON:  02/15/2020 FINDINGS: ECMO cannula, tracheostomy tube and left jugular temporary dialysis catheter are again noted and stable. Right-sided PICC line is seen extending to the cavoatrial junction. Gastric catheter extends into the stomach. Diffuse bilateral airspace opacity is noted with air bronchograms similar to that seen on the  prior exam. No significant improvement is noted. No bony abnormality is seen. IMPRESSION: Diffuse bilateral opacities consistent with the given clinical history. Tubes and lines as described stable in appearance. Electronically Signed   By: Inez Catalina M.D.   On: 02/16/2020 06:33   DG CHEST PORT 1 VIEW  Result Date: 02/15/2020 CLINICAL DATA:  Encounter for hemodialysis.  Respiratory distress. EXAM: PORTABLE CHEST 1 VIEW COMPARISON:  Radiograph earlier this day. FINDINGS: New left internal jugular dialysis catheter tip in the upper SVC. No visualized pneumothorax. Right internal jugular ECMO catheter tip in the upper abdomen not entirely included in the field of view. Tracheostomy tube tip at the thoracic inlet. Enteric tube remains in place. Near complete opacification throughout both hemi thoraces with slight improved aeration from prior exam and increasing ventilation in the perihilar lungs. IMPRESSION: 1. New left internal jugular dialysis catheter tip in the upper SVC. No visualized pneumothorax. 2. Additional support apparatus unchanged. 3. Near complete opacification throughout both lungs with slight improved aeration from prior exam and increasing ventilation in the perihilar lungs. Electronically Signed   By: Keith Rake M.D.   On: 02/15/2020 19:05    Labs: [employer] Recent [employer] 02/13/20 0314 02/13/20 0315 02/14/20 0249 02/14/20 0406 02/14/20 1600 02/14/20 1603 02/15/20 0407 02/15/20 1207 02/15/20 1614 02/15/20 1947 02/16/20 0421 02/16/20 0436 02/16/20 0801 02/16/20 1732 02/16/20 1744 02/16/20 1951 02/17/20 0219 02/17/20 0440 02/17/20 0459  NA 137   < > 136   < >  138   < > 138   < > 138   < > 137   < > 138 140 140 139 136 139 138  K 3.8   < > 3.8   < > 4.1   < > 3.8   < > 4.2   < > 3.7   < > 4.1 4.1 4.0 4.5 3.5 3.8 3.8  CL 100   < > 100  --  101  --  103  --  104  --  103  --   --   --  106  --  103  --   --   CO2 23   < > 22  --  21*  --  19*  --  21*  --  23  --   --    --  23  --  22  --   --   GLUCOSE 194*   < > 178*  --  153*  --  186*  --  146*  --  147*  --   --   --  130*  --  97  --   --   BUN 28*   < > 31*  --  47*  --  58*  --  48*  --  35*  --   --   --  26*  --  19  --   --   CREATININE 1.32*   < > 1.77*  --  2.42*  --  2.93*  --  2.36*  --  1.75*  --   --   --  1.60*  --  1.29*  --   --   CALCIUM 8.6*   < > 8.5*  --  8.7*  --  8.7*  --  8.5*  --  8.3*  --   --   --  8.4*  --  8.0*  --   --   PHOS 3.7  --  3.6  --   --   --  5.2*  --  4.1  --  2.7  --   --   --  3.3  --  2.1*  --   --    < > = values in this interval not displayed.   CBC Recent Labs  Lab 02/15/20 1614 02/15/20 1947 02/16/20 0421 02/16/20 0436 02/16/20 1744 02/16/20 1744 02/16/20 1951 02/17/20 0218 02/17/20 0440 02/17/20 0459  WBC 9.9  --  7.0  --  7.4  --   --  7.0  --   --   HGB 8.7*   < > 8.1*   < > 8.0*   < > 8.2* 8.0* 8.2* 8.5*  HCT 28.5*   < > 27.0*   < > 27.5*   < > 24.0* 27.0* 24.0* 25.0*  MCV 111.8*  --  111.6*  --  112.7*  --   --  113.4*  --   --   PLT 100*  --  102*  --  103*  --   --  107*  --   --    < > = values in this interval not displayed.    Medications:     amiodarone  200 mg Per Tube Daily   vitamin C  500 mg Per Tube Daily   chlorhexidine gluconate (MEDLINE KIT)  15 mL Mouth Rinse BID   Chlorhexidine Gluconate Cloth  6 each Topical Q0600   clonazePAM  1 mg Per Tube Q6H   clotrimazole  1  Applicatorful Vaginal QHS   docusate  100 mg Per Tube BID   feeding supplement (PROSource TF)  45 mL Per Tube QID   Gerhardt's butt cream   Topical BID   linagliptin  5 mg Per Tube Daily   mouth rinse  15 mL Mouth Rinse 10 times per day   melatonin  9 mg Per Tube QHS   midodrine  10 mg Per Tube TID   multivitamin  1 tablet Per Tube QHS   oxyCODONE  10 mg Per Tube Q6H   pantoprazole (PROTONIX) IV  40 mg Intravenous BID   polyethylene glycol  17 g Per Tube Daily   QUEtiapine  50 mg Per Tube QHS   sennosides  5 mL Per Tube BID   zinc  sulfate  220 mg Per Tube Daily      Madelon Lips MD 02/17/2020, 8:26 AM

## 2020-02-17 NOTE — Progress Notes (Signed)
Nutrition Follow-up  DOCUMENTATION CODES:   Morbid obesity  INTERVENTION:   Tube Feeding via J-port of G-J tube:  Pivot 1.5 at 65 ml/hr Initiate at 20 ml/hr; titrate by 10 mL q 8 hours until goal rate of 65 ml/hr Pro-Source TF 45 mL QID  At goal TF provides: 2500 kcals, 190 grams protein, 1170 ml free water. Meets 100% needs.    NUTRITION DIAGNOSIS:   Increased nutrient needs related to acute illness (COVID-19 infection) as evidenced by estimated needs.  Being addressed via TF   GOAL:   Patient will meet greater than or equal to 90% of their needs  Progressing  MONITOR:   Vent status, TF tolerance, Labs, Weight trends  REASON FOR ASSESSMENT:   Ventilator, Consult Enteral/tube feeding initiation and management  ASSESSMENT:   54 y.o. female with medical history of type 2 DM and obesity. She presented to the ED at [facility] with SOB diarrhea x1 week, and cough. She was dx with COVID-19 one week prior to presentation to the ED.  8/9- tx from [facility], s/p VV ECMO cannulation 8/13- CT head with multiple areas of [place] 8/16- extubated, gastric Cortrak placed  8/22- start CRRT 8/30- trach 9/3- large vomiting episode, Cortrak advanced 2nd portion duodenum 9/6- stop CRRT, Cortrak coiled in stomach  9/7- first iHD 9/9- CRRT restarted 9/10- G-J tube placed  G-J tube placed yesterday Remains on vent support via trach, VV ECMO, Sweep trials today Continues on CRRT  Pt was tolerating trickle TF of Pivot 1.5 at 20 ml/hr prior to G-J placement  Labs:  reviewed Meds: reviewed   Diet Order:   Diet Order            Diet NPO time specified Except for: Ice Chips  Diet effective now                 EDUCATION NEEDS:   No education needs have been identified at this time  Skin:  Skin Assessment: Skin Integrity Issues: Skin Integrity Issues:: Other (Comment) Other: MASD- breast skin tears-face/chest/chin/buttocks  Last BM:  9/8  Height:   Ht Readings  from Last 1 Encounters:  02/08/20 5\' 4"  (1.626 m)    Weight:   Wt Readings from Last 1 Encounters:  02/17/20 115.4 kg    Ideal Body Weight:  54.5 kg  BMI:  Body mass index is 43.67 kg/m.  Estimated Nutritional Needs:   Kcal:  4920-1007 kcal  Protein:  170-210 grams  Fluid:  >/= 2 L/day  Kerman Passey MS, RDN, LDN, CNSC Registered Dietitian III Clinical Nutrition RD Pager and On-Call Pager Number Located in [place]

## 2020-02-17 NOTE — Progress Notes (Signed)
Patient ID: Alisha Stephenson, female   DOB: 27-Apr-1966, 54 y.o.   MRN: [id number] Patient ID: Alisha Stephenson, female   DOB: 1965/07/11, 23 y.o.   MRN: [id number]    Advanced Heart Failure Rounding Note   Subjective:    - 8/2 COVID + test - 8/9 Cannulated for VV ECMO - 8/13 with several areas of intracranial hemorrhage. Bival stopped.  - 8/14 CT no change in [place]. Increased edema - 8/16 Extubated - 8/16 Head CT stable bleed - 8/22 CVVHD started - 8/24 reintubated - 8/24 circuit changed - 8/30 trach - 9/7  Switched to iHD - 02/15/20 Switched back to CVVHD - 9/10 GJ tube placed  Remains awake on vent. Following commands but not able to move much (fingers and toes), working with PT/OT. CVVH ongoing with net UF 50 cc/hr. I/O net -1.6 L.   She is now off pressors.   ECMO  Flow 5.0 L RPM 4300 Sweep9  Labs: 7.46/36/71/95% Fio2 40%. Sweep 7.  Hgb8 PLT 104k -> 95k -> 107 LDH 489 -> 401 -> 376 -> 326 -> 303 Lactic acid1.2   Objective:   Weight Range:  Vital Signs:   Temp:  [98.2 F (36.8 C)-98.8 F (37.1 C)] 98.6 F (37 C) (09/11 0400) Pulse Rate:  [76-116] 97 (09/11 0700) Resp:  [15-17] 15 (09/11 0208) SpO2:  [88 %-100 %] 95 % (09/11 0700) Arterial Line BP: (83-156)/(45-66) 156/66 (09/11 0700) FiO2 (%):  [40 %] 40 % (09/11 0208) Weight:  [115.4 kg] 115.4 kg (09/11 0500) Last BM Date: 02/16/20  Weight change: Filed Weights   02/15/20 0500 02/16/20 0452 02/17/20 0500  Weight: 121.2 kg 119.8 kg 115.4 kg    Intake/Output:   Intake/Output Summary (Last 24 hours) at 02/17/2020 0801 Last data filed at 02/17/2020 0700 Gross per 24 hour  Intake 272.46 ml  Output 1821 ml  Net -1548.54 ml     Physical Exam: General: NAD Neck: No JVD, no thyromegaly or thyroid nodule.  Lungs: Decreased bilaterally CV: Nondisplaced PMI.  Heart regular S1/S2, no S3/S4, no murmur.  No peripheral edema.   Abdomen: Soft, nontender, no hepatosplenomegaly, no distention.  Skin: Intact  without lesions or rashes.  Neurologic: Awake, follows commands Extremities: No clubbing or cyanosis.  HEENT: Normal.   Telemetry: Sinus 70-80s Personally reviewed   Labs: Basic Metabolic Panel: Recent Labs  Lab 02/11/20 0322 02/11/20 0333 02/12/20 0405 02/12/20 0516 02/13/20 0314 02/13/20 0315 02/15/20 0407 02/15/20 1207 02/15/20 1614 02/15/20 1947 02/16/20 0421 02/16/20 0436 02/16/20 1744 02/16/20 1951 02/17/20 0218 02/17/20 0219 02/17/20 0440 02/17/20 0459  NA 138   < > 137   < > 137   < > 138   < > 138   < > 137   < > 140 139  --  136 139 138  K 4.0   < > 4.0   < > 3.8   < > 3.8   < > 4.2   < > 3.7   < > 4.0 4.5  --  3.5 3.8 3.8  CL 101   < > 102   < > 100   < > 103  --  104  --  103  --  106  --   --  103  --   --   CO2 23   < > 22   < > 23   < > 19*  --  21*  --  23  --  23  --   --  22  --   --   GLUCOSE 172*   < > 126*   < > 194*   < > 186*  --  146*  --  147*  --  130*  --   --  97  --   --   BUN 28*   < > 14   < > 28*   < > 58*  --  48*  --  35*  --  26*  --   --  19  --   --   CREATININE 1.06*   < > 0.91   < > 1.32*   < > 2.93*  --  2.36*  --  1.75*  --  1.60*  --   --  1.29*  --   --   CALCIUM 8.5*   < > 8.5*   < > 8.6*   < > 8.7*  --  8.5*   < > 8.3*  --  8.4*  --   --  8.0*  --   --   MG 2.7*  --  2.7*  --  2.9*  --   --   --   --   --  2.6*  --   --   --  2.5*  --   --   --   PHOS 2.7   < > 2.5   < > 3.7   < > 5.2*  --  4.1  --  2.7  --  3.3  --   --  2.1*  --   --    < > = values in this interval not displayed.    Liver Function Tests: Recent Labs  Lab 02/12/20 1130 02/15/20 1614 02/16/20 0421 02/16/20 1744 02/17/20 0219  ALBUMIN 2.5* 2.4* 2.3* 2.4* 2.2*   No results for input(s): LIPASE, AMYLASE in the last 168 hours. No results for input(s): AMMONIA in the last 168 hours.  CBC: Recent Labs  Lab 02/15/20 0407 02/15/20 1207 02/15/20 1614 02/15/20 1947 02/16/20 0421 02/16/20 0436 02/16/20 1744 02/16/20 1951 02/17/20 0218 02/17/20 0440  02/17/20 0459  WBC 9.8  --  9.9  --  7.0  --  7.4  --  7.0  --   --   HGB 8.2*   < > 8.7*   < > 8.1*   < > 8.0* 8.2* 8.0* 8.2* 8.5*  HCT 28.0*   < > 28.5*   < > 27.0*   < > 27.5* 24.0* 27.0* 24.0* 25.0*  MCV 111.1*  --  111.8*  --  111.6*  --  112.7*  --  113.4*  --   --   PLT 95*  --  100*  --  102*  --  103*  --  107*  --   --    < > = values in this interval not displayed.    Cardiac Enzymes: No results for input(s): CKTOTAL, CKMB, CKMBINDEX, TROPONINI in the last 168 hours.  BNP: BNP (last 3 results) No results for input(s): BNP in the last 8760 hours.  ProBNP (last 3 results) No results for input(s): PROBNP in the last 8760 hours.    Other results:  Imaging: IR GASTROSTOMY TUBE MOD SED  Result Date: 02/16/2020 INDICATION: 54 year old female currently on ECMO and at risk for aspiration. She requires percutaneous gastrojejunostomy access to allow for enteral feeding. EXAM: Fluoroscopically guided placement of percutaneous pull-through gastrostomy tube Conversion to gastrojejunostomy tube Interventional Radiologist:  Criselda Peaches, MD MEDICATIONS: 2 g Ancef; Antibiotics  were administered within 1 hour of the procedure. ANESTHESIA/SEDATION: Versed 2 mg IV; Fentanyl 50 mcg IV Moderate Sedation Time:  22 minutes The patient was continuously monitored during the procedure by the interventional radiology nurse under my direct supervision. CONTRAST:  45m OMNIPAQUE IOHEXOL 300 MG/ML  SOLN FLUOROSCOPY TIME:  Fluoroscopy Time: 8 minutes 42 seconds (145 mGy). COMPLICATIONS: None immediate. PROCEDURE: Informed written consent was obtained from the patient after a thorough discussion of the procedural risks, benefits and alternatives. All questions were addressed. Maximal Sterile Barrier Technique was utilized including caps, mask, sterile gowns, sterile gloves, sterile drape, hand hygiene and skin antiseptic. A timeout was performed prior to the initiation of the procedure. Maximal barrier  sterile technique utilized including caps, mask, sterile gowns, sterile gloves, large sterile drape, hand hygiene, and chlorhexadine skin prep. An angled catheter was advanced over a wire under fluoroscopic guidance through the nose, down the esophagus and into the body of the stomach. The stomach was then insufflated with several 100 ml of air. Fluoroscopy confirmed location of the gastric bubble, as well as inferior displacement of the barium stained colon. Under direct fluoroscopic guidance, a single T-tack was placed, and the anterior gastric wall drawn up against the anterior abdominal wall. Percutaneous access was then obtained into the mid gastric body with an 18 gauge sheath needle. Aspiration of air, and injection of contrast material under fluoroscopy confirmed needle placement. An Amplatz wire was advanced in the gastric body and the access needle exchanged for a 9-French vascular sheath. A snare device was advanced through the vascular sheath and an Amplatz wire advanced through the angled catheter. The Amplatz wire was successfully snared and this was pulled up through the esophagus and out the mouth. A 20-French KAlinda DoomsMIC-PEG tube was then connected to the snare and pulled through the mouth, down the esophagus, into the stomach and out to the anterior abdominal wall. Hand injection of contrast material confirmed intragastric location. The T-tack retention suture was then cut. A C2 cobra catheter and Glidewire were then advanced coaxially through the gastrostomy tube into the stomach. The catheter and wire combination was successfully advanced through the pylorus and into the proximal small bowel. A 9 French jejunal limb was then advanced over the wire and position with the tip in the proximal jejunum. A gentle hand injection of contrast material was performed confirming the location of the tube. The external bumper was secured in place. The tube was flushed through the jejunal and gastric  lumen and both were capped. The patient will be observed for several hours with the newly placed tube on low wall suction to evaluate for any post procedure complication. The patient tolerated the procedure well, there is no immediate complication. IMPRESSION: Successful placement of a 20 French pull through gastrostomy tube converted to a gastro jejunostomy tumor with the addition of a 9 FP[place]je[place]unal arm. The J arm tip is in the proximal jejunum and ready for use. Electronically Signed   By: HJacqulynn CadetM.D.   On: 02/16/2020 16:41   IR GASTR TUBE CONVERT GASTR-JEJ PER W/FL MOD SED  Result Date: 02/16/2020 INDICATION: 54year old female currently on ECMO and at risk for aspiration. She requires percutaneous gastrojejunostomy access to allow for enteral feeding. EXAM: Fluoroscopically guided placement of percutaneous pull-through gastrostomy tube Conversion to gastrojejunostomy tube Interventional Radiologist:  HCriselda Peaches MD MEDICATIONS: 2 g Ancef; Antibiotics were administered within 1 hour of the procedure. ANESTHESIA/SEDATION: Versed 2 mg IV; Fentanyl 50 mcg IV Moderate  Sedation Time:  22 minutes The patient was continuously monitored during the procedure by the interventional radiology nurse under my direct supervision. CONTRAST:  78m OMNIPAQUE IOHEXOL 300 MG/ML  SOLN FLUOROSCOPY TIME:  Fluoroscopy Time: 8 minutes 42 seconds (145 mGy). COMPLICATIONS: None immediate. PROCEDURE: Informed written consent was obtained from the patient after a thorough discussion of the procedural risks, benefits and alternatives. All questions were addressed. Maximal Sterile Barrier Technique was utilized including caps, mask, sterile gowns, sterile gloves, sterile drape, hand hygiene and skin antiseptic. A timeout was performed prior to the initiation of the procedure. Maximal barrier sterile technique utilized including caps, mask, sterile gowns, sterile gloves, large sterile drape, hand hygiene, and  chlorhexadine skin prep. An angled catheter was advanced over a wire under fluoroscopic guidance through the nose, down the esophagus and into the body of the stomach. The stomach was then insufflated with several 100 ml of air. Fluoroscopy confirmed location of the gastric bubble, as well as inferior displacement of the barium stained colon. Under direct fluoroscopic guidance, a single T-tack was placed, and the anterior gastric wall drawn up against the anterior abdominal wall. Percutaneous access was then obtained into the mid gastric body with an 18 gauge sheath needle. Aspiration of air, and injection of contrast material under fluoroscopy confirmed needle placement. An Amplatz wire was advanced in the gastric body and the access needle exchanged for a 9-French vascular sheath. A snare device was advanced through the vascular sheath and an Amplatz wire advanced through the angled catheter. The Amplatz wire was successfully snared and this was pulled up through the esophagus and out the mouth. A 20-French KAlinda DoomsMIC-PEG tube was then connected to the snare and pulled through the mouth, down the esophagus, into the stomach and out to the anterior abdominal wall. Hand injection of contrast material confirmed intragastric location. The T-tack retention suture was then cut. A C2 cobra catheter and Glidewire were then advanced coaxially through the gastrostomy tube into the stomach. The catheter and wire combination was successfully advanced through the pylorus and into the proximal small bowel. A 9 French jejunal limb was then advanced over the wire and position with the tip in the proximal jejunum. A gentle hand injection of contrast material was performed confirming the location of the tube. The external bumper was secured in place. The tube was flushed through the jejunal and gastric lumen and both were capped. The patient will be observed for several hours with the newly placed tube on low wall suction  to evaluate for any post procedure complication. The patient tolerated the procedure well, there is no immediate complication. IMPRESSION: Successful placement of a 20 French pull through gastrostomy tube converted to a gastro jejunostomy tumor with the addition of a 9 [place]unal arm. The J arm tip is in the proximal jejunum and ready for use. Electronically Signed   By: HJacqulynn CadetM.D.   On: 02/16/2020 16:41   DG CHEST PORT 1 VIEW  Result Date: 02/16/2020 CLINICAL DATA:  COVID-19 infection on ECMO EXAM: PORTABLE CHEST 1 VIEW COMPARISON:  02/15/2020 FINDINGS: ECMO cannula, tracheostomy tube and left jugular temporary dialysis catheter are again noted and stable. Right-sided PICC line is seen extending to the cavoatrial junction. Gastric catheter extends into the stomach. Diffuse bilateral airspace opacity is noted with air bronchograms similar to that seen on the prior exam. No significant improvement is noted. No bony abnormality is seen. IMPRESSION: Diffuse bilateral opacities consistent with the given clinical history. Tubes and  lines as described stable in appearance. Electronically Signed   By: Inez Catalina M.D.   On: 02/16/2020 06:33   DG CHEST PORT 1 VIEW  Result Date: 02/15/2020 CLINICAL DATA:  Encounter for hemodialysis.  Respiratory distress. EXAM: PORTABLE CHEST 1 VIEW COMPARISON:  Radiograph earlier this day. FINDINGS: New left internal jugular dialysis catheter tip in the upper SVC. No visualized pneumothorax. Right internal jugular ECMO catheter tip in the upper abdomen not entirely included in the field of view. Tracheostomy tube tip at the thoracic inlet. Enteric tube remains in place. Near complete opacification throughout both hemi thoraces with slight improved aeration from prior exam and increasing ventilation in the perihilar lungs. IMPRESSION: 1. New left internal jugular dialysis catheter tip in the upper SVC. No visualized pneumothorax. 2. Additional support apparatus  unchanged. 3. Near complete opacification throughout both lungs with slight improved aeration from prior exam and increasing ventilation in the perihilar lungs. Electronically Signed   By: Keith Rake M.D.   On: 02/15/2020 19:05     Medications:     Scheduled Medications: . amiodarone  200 mg Per Tube Daily  . vitamin C  500 mg Per Tube Daily  . chlorhexidine gluconate (MEDLINE KIT)  15 mL Mouth Rinse BID  . Chlorhexidine Gluconate Cloth  6 each Topical Q0600  . clonazePAM  1 mg Per Tube Q6H  . clotrimazole  1 Applicatorful Vaginal QHS  . docusate  100 mg Per Tube BID  . feeding supplement (PROSource TF)  45 mL Per Tube QID  . Gerhardt's butt cream   Topical BID  . linagliptin  5 mg Per Tube Daily  . mouth rinse  15 mL Mouth Rinse 10 times per day  . melatonin  9 mg Per Tube QHS  . midodrine  10 mg Per Tube TID  . multivitamin  1 tablet Per Tube QHS  . oxyCODONE  10 mg Per Tube Q6H  . pantoprazole (PROTONIX) IV  40 mg Intravenous BID  . polyethylene glycol  17 g Per Tube Daily  . QUEtiapine  50 mg Per Tube QHS  . sennosides  5 mL Per Tube BID  . zinc sulfate  220 mg Per Tube Daily    Infusions: .  prismasol BGK 4/2.5 400 mL/hr at 02/16/20 0344  .  prismasol BGK 4/2.5 200 mL/hr at 02/17/20 0160  . sodium chloride 500 mL (02/11/20 2215)  . sodium chloride 10 mL/hr at 02/17/20 0700  . sodium chloride    . sodium chloride    . albumin human 12.5 g (02/12/20 2231)  . albumin human Stopped (02/06/20 2000)  . feeding supplement (PIVOT 1.5 CAL) Stopped (02/16/20 1093)  . heparin 500 Units/hr (02/17/20 0700)  . insulin 0.4 mL/hr at 02/17/20 0700  . norepinephrine (LEVOPHED) Adult infusion Stopped (02/16/20 0456)  . phenylephrine (NEO-SYNEPHRINE) Adult infusion Stopped (02/13/20 0155)  . prismasol BGK 4/2.5 2,000 mL/hr at 02/17/20 0659    PRN Medications: sodium chloride, sodium chloride, sodium chloride, sodium chloride, acetaminophen (TYLENOL) oral liquid 160 mg/5 mL,  albumin human, albuterol, alteplase, alum & mag hydroxide-simeth, dextrose, fentaNYL (SUBLIMAZE) injection, guaiFENesin-dextromethorphan, heparin, heparin, heparin, hydrALAZINE, HYDROmorphone (DILAUDID) injection, hydrOXYzine, lidocaine (PF), lidocaine-prilocaine, ondansetron **OR** ondansetron (ZOFRAN) IV, pentafluoroprop-tetrafluoroeth   Assessment/Plan:   1. Acute hypoxic/hypercapneic respiratory failure in setting of severe COVID PNA/ARDS -> VV ECMO - admit 8/2 - intubation 8/3 - has received actmera (compelted 8/2), remdesivir (completed 8/6) and steroids - Cannulated for VV ECMO on 8/9 - Extubated 8/16. Reintubated 8/24 -  s/p trach 8/30. - Circuit changed 8/24 due to concern for hemolysis and worsening oxygenation. - CVVHD started 8/22 for volume removal and uremia. Remains anuric.CXR worse on 9/9 so switched back to CVVHD.  Will continue UF -50 cc/hr net today.  - Remains on low-dose heparin. No bleeding. Discussed dosing with PharmD personally. - Off IV sedation. Continue melatonin and seroquel for sleep at night  - Off pressors and on midodrine 10 tid - CXR slowly improving.  - Main issues remain to be poor lung recovery, critical care myopathy and AKI. Continue slow wean as tolerated.   2. Enterococcus sepsis - Finished high-dose zosyn on 9/7 per ID - Eraxis added on 8/26 with yeast in BAL 8/24 (ended 9/2)  - Cultures repeated with increasing pressor demands - F/u cx 9/7 Remain NGTD.   3. Intracranial hemorrhage - ? Septic emboli - repeat head CT on 8/14 with stable bleeds but increased edema - neurology has seen. Suspect significant long-term injury sustained. Will follow commands. Appears to have dense LUE weakness and possibly LLE - repeat head CT stable 8/16 - tolerating low-dose heparin  4. Thrombocytopenia - PLTs ~95k-110K  - Continue to follow. No change  5. Morbid obesity - Body mass index is Body mass index is 43.67 kg/m.  6. Poorly controlled DM2 -  HgBA1c 10.7 - On insulin gtt  7. PAF - intermittent episodes. Last 8/26 - Now on po. Quiescent  8. AKI/azotemia - CVVHD started on 8/22 - Down 70 pounds.  - transitioned to [facility] on 9/7 - switched back to CVVHD for better volume removal on 9/9. - new catheter placed 9/9 - will eventually need tunneled access - Continue UF net 50 cc/hr via CVVH today  9. Emesis - Cor-trak out. TFs at 20 due to emesis  - Has GJ tube now.   10. Sacral decub  - Wound care following  CRITICAL CARE Performed by: Loralie Champagne  Total critical care time: 35 minutes  Critical care time was exclusive of separately billable procedures and treating other patients.  Critical care was necessary to treat or prevent imminent or life-threatening deterioration.  Critical care was time spent personally by me (independent of midlevel providers or residents) on the following activities: development of treatment plan with patient and/or surrogate as well as nursing, discussions with consultants, evaluation of patient's response to treatment, examination of patient, obtaining history from patient or surrogate, ordering and performing treatments and interventions, ordering and review of laboratory studies, ordering and review of radiographic studies, pulse oximetry and re-evaluation of patient's condition.   Length of Stay: 40   Loralie Champagne  MD 02/17/2020, 8:01 AM  Advanced Heart Failure Team Pager [phone] (M-F; [place])  Please contact [facility] for night-coverage after hours (4p -7a ) and weekends on [email or web address]

## 2020-02-17 NOTE — Progress Notes (Signed)
ANTICOAGULATION CONSULT NOTE - [place] for Heparin Indication: ECMO  Allergies  Allergen Reactions  . Sulfa Antibiotics Rash and Other (See Comments)    Patient Measurements: Height: 5\' 4"  (162.6 cm) Weight: 115.4 kg (254 lb 6.6 oz) IBW/kg (Calculated) : 54.7 Heparin Dosing Weight: 87 kg  Vital Signs: Temp: 98.6 F (37 C) (09/11 0400) BP: 123/104 (09/11 1200) Pulse Rate: 92 (09/11 1300)  Labs: Recent Labs    02/16/20 0421 02/16/20 0436 02/16/20 1744 02/16/20 1951 02/17/20 0218 02/17/20 0219 02/17/20 0431 02/17/20 0440 02/17/20 0459 02/17/20 0459 02/17/20 0907 02/17/20 1139  HGB 8.1*   < > 8.0*   < > 8.0*  --   --    < > 8.5*   < > 9.9* 10.2*  HCT 27.0*   < > 27.5*   < > 27.0*  --   --    < > 25.0*  --  29.0* 30.0*  PLT 102*  --  103*  --  107*  --   --   --   --   --   --   --   APTT 39*   < > 34  --  191*  --  36  --   --   --   --   --   HEPARINUNFRC <0.10*  --   --   --   --  0.67 <0.10*  --   --   --   --   --   CREATININE 1.75*  --  1.60*  --   --  1.29*  --   --   --   --   --   --    < > = values in this interval not displayed.    Estimated Creatinine Clearance: 61.5 mL/min (A) (by C-G formula based on SCr of 1.29 mg/dL (H)).   Medical History: Past Medical History:  Diagnosis Date  . Asthma   . Diabetes mellitus without complication ([place])   . Diverticulitis   . Gallstones   . IBS (irritable bowel syndrome)   . NAFLD (nonalcoholic fatty liver disease)    CT scan 2015  . Ovarian cyst     Assessment: 54 yo female COVID+ on VV ECMO. Patient started on bivalirudin but was found to have multiple ICH on 8/13 head CT. Bivalirudin was stopped and pt was off anticoagulation at that time. Her ECMO circuit was changed 8/24. Pharmacy asked to start low-fixed rate heparin. Heparin infusion was held last week due to bleeding from trach site.  Heparin to continue at 500 units/hr with plans to not titrate for now per ECMO team.  Heparin level remained undetectable this morning, aPTT 36. Hgb down 8.5, plt 107, fibrinogen 416, LDH 303.  PEG placed 9/10, no bleeding issues noted.   Goal of Therapy:  Heparin level 0.2-0.5 units/ml - currently not titrating to goal per discussion with ECMO team Monitor platelets by anticoagulation protocol: Yes   Plan:  Continue heparin infusion at 500 units/hrrecommendations No plans to titrate per ECMO team for now Monitor daily heparin level, CBC, LDH, fibrinogen, s/sx of bleeding  Erin Hearing PharmD., BCPS Clinical Pharmacist 02/17/2020 2:38 PM

## 2020-02-17 NOTE — Procedures (Signed)
Extracorporeal support note   ECLS support day: 33 (cannulated 01/21/2020) Indication: Covid 19 pneumonia/ARDS  Configuration: VV  Drainage cannula: right IJ [place]  Return cannula: same  Pump speed: 4300 Pump flow: 5 Pump used: Centrimag  Oxygenator: Quadrox O2 blender: 100% Sweep gas: 6  ABG    Component Value Date/Time   PHART 7.464 (H) 02/17/2020 0459   PCO2ART 36.9 02/17/2020 0459   PO2ART 71 (L) 02/17/2020 0459   HCO3 26.5 02/17/2020 0459   TCO2 28 02/17/2020 0459   ACIDBASEDEF 1.0 02/15/2020 1947   O2SAT 95.0 02/17/2020 0459    Circuit check: minimal visible clot- only in corners Anticoagulant: Continue heparin Anticoagulation targets: fixed dose heparin given high bleeding risk  Changes in support: con't sweep titration to work towards decannulation  Anticipated goals/duration of support: bridge to recovery  Julian Hy, MD 02/17/20 9:34 PM [facility]

## 2020-02-17 NOTE — Consult Note (Signed)
WOC Nurse Consult Note: Reason for Consult:Consulted for vascular changes on the 1st and 2nd digits of right hand. Wound type:Ischemic Pressure Injury POA: N/A Dressing procedure/placement/frequency:Paitent has been seen by Vascular Surgery (Dr. Oneida Alar) for these two affected digits. His notes on 8/23 and 8/27 indicate that there was not an emergent need for amputation and that the gangrenous changes were consistent with the patient's complex medical condition. In discussing the consultation with the patient's MD, Dr. Renaldo Harrison via Secure Chat today, it is agreed that [place] will provide guidance for conservative topical care using a betadine swabstick for topical astringent and antimicrobial properties and allowing the solution to air dry twice daily. Dr. [name] will reconsult Vascular on Monday.  [facility] nursing team will follow for the buttock lesion identified on 02/14/20, and will remain available to this patient, the nursing and medical teams.   Thanks, Maudie Flakes, MSN, RN, [place], Arther Abbott  Pager# [phone]

## 2020-02-18 ENCOUNTER — Inpatient Hospital Stay (HOSPITAL_COMMUNITY): Payer: PRIVATE HEALTH INSURANCE

## 2020-02-18 LAB — POCT I-STAT 7, (LYTES, BLD GAS, ICA,H+H)
Acid-Base Excess: 1 mmol/L (ref 0.0–2.0)
Acid-Base Excess: 1 mmol/L (ref 0.0–2.0)
Acid-Base Excess: 2 mmol/L (ref 0.0–2.0)
Acid-Base Excess: 0 mmol/L (ref 0.0–2.0)
Acid-Base Excess: 4 mmol/L — ABNORMAL HIGH (ref 0.0–2.0)
Bicarbonate: 26.8 mmol/L (ref 20.0–28.0)
Bicarbonate: 26.7 mmol/L (ref 20.0–28.0)
Bicarbonate: 28.3 mmol/L — ABNORMAL HIGH (ref 20.0–28.0)
Bicarbonate: 26.1 mmol/L (ref 20.0–28.0)
Bicarbonate: 29.3 mmol/L — ABNORMAL HIGH (ref 20.0–28.0)
Calcium, Ion: 1.17 mmol/L (ref 1.15–1.40)
Calcium, Ion: 1.13 mmol/L — ABNORMAL LOW (ref 1.15–1.40)
Calcium, Ion: 1.17 mmol/L (ref 1.15–1.40)
Calcium, Ion: 1.15 mmol/L (ref 1.15–1.40)
Calcium, Ion: 1.13 mmol/L — ABNORMAL LOW (ref 1.15–1.40)
HCT: 23 % — ABNORMAL LOW (ref 36.0–46.0)
HCT: 29 % — ABNORMAL LOW (ref 36.0–46.0)
HCT: 31 % — ABNORMAL LOW (ref 36.0–46.0)
HCT: 30 % — ABNORMAL LOW (ref 36.0–46.0)
HCT: 29 % — ABNORMAL LOW (ref 36.0–46.0)
Hemoglobin: 7.8 g/dL — ABNORMAL LOW (ref 12.0–15.0)
Hemoglobin: 9.9 g/dL — ABNORMAL LOW (ref 12.0–15.0)
Hemoglobin: 10.5 g/dL — ABNORMAL LOW (ref 12.0–15.0)
Hemoglobin: 10.2 g/dL — ABNORMAL LOW (ref 12.0–15.0)
Hemoglobin: 9.9 g/dL — ABNORMAL LOW (ref 12.0–15.0)
O2 Saturation: 94 %
O2 Saturation: 93 %
O2 Saturation: 90 %
O2 Saturation: 88 %
O2 Saturation: 83 %
Patient temperature: 99.7
Patient temperature: 97.8
Patient temperature: 97.8
Patient temperature: 97.8
Patient temperature: 97.8
Potassium: 3.7 mmol/L (ref 3.5–5.1)
Potassium: 4 mmol/L (ref 3.5–5.1)
Potassium: 4.5 mmol/L (ref 3.5–5.1)
Potassium: 4.2 mmol/L (ref 3.5–5.1)
Potassium: 4.6 mmol/L (ref 3.5–5.1)
Sodium: 139 mmol/L (ref 135–145)
Sodium: 138 mmol/L (ref 135–145)
Sodium: 138 mmol/L (ref 135–145)
Sodium: 137 mmol/L (ref 135–145)
Sodium: 137 mmol/L (ref 135–145)
TCO2: 28 mmol/L (ref 22–32)
TCO2: 28 mmol/L (ref 22–32)
TCO2: 30 mmol/L (ref 22–32)
TCO2: 28 mmol/L (ref 22–32)
TCO2: 31 mmol/L (ref 22–32)
pCO2 arterial: 49.9 mmHg — ABNORMAL HIGH (ref 32.0–48.0)
pCO2 arterial: 46.6 mmHg (ref 32.0–48.0)
pCO2 arterial: 52.1 mmHg — ABNORMAL HIGH (ref 32.0–48.0)
pCO2 arterial: 45.6 mmHg (ref 32.0–48.0)
pCO2 arterial: 48.2 mmHg — ABNORMAL HIGH (ref 32.0–48.0)
pH, Arterial: 7.341 — ABNORMAL LOW (ref 7.350–7.450)
pH, Arterial: 7.364 (ref 7.350–7.450)
pH, Arterial: 7.341 — ABNORMAL LOW (ref 7.350–7.450)
pH, Arterial: 7.364 (ref 7.350–7.450)
pH, Arterial: 7.39 (ref 7.350–7.450)
pO2, Arterial: 80 mmHg — ABNORMAL LOW (ref 83.0–108.0)
pO2, Arterial: 69 mmHg — ABNORMAL LOW (ref 83.0–108.0)
pO2, Arterial: 62 mmHg — ABNORMAL LOW (ref 83.0–108.0)
pO2, Arterial: 56 mmHg — ABNORMAL LOW (ref 83.0–108.0)
pO2, Arterial: 48 mmHg — ABNORMAL LOW (ref 83.0–108.0)

## 2020-02-18 LAB — GLUCOSE, CAPILLARY
Glucose-Capillary: 146 mg/dL — ABNORMAL HIGH (ref 70–99)
Glucose-Capillary: 150 mg/dL — ABNORMAL HIGH (ref 70–99)
Glucose-Capillary: 154 mg/dL — ABNORMAL HIGH (ref 70–99)
Glucose-Capillary: 156 mg/dL — ABNORMAL HIGH (ref 70–99)
Glucose-Capillary: 156 mg/dL — ABNORMAL HIGH (ref 70–99)
Glucose-Capillary: 158 mg/dL — ABNORMAL HIGH (ref 70–99)
Glucose-Capillary: 158 mg/dL — ABNORMAL HIGH (ref 70–99)
Glucose-Capillary: 163 mg/dL — ABNORMAL HIGH (ref 70–99)
Glucose-Capillary: 168 mg/dL — ABNORMAL HIGH (ref 70–99)
Glucose-Capillary: 176 mg/dL — ABNORMAL HIGH (ref 70–99)
Glucose-Capillary: 178 mg/dL — ABNORMAL HIGH (ref 70–99)
Glucose-Capillary: 182 mg/dL — ABNORMAL HIGH (ref 70–99)
Glucose-Capillary: 188 mg/dL — ABNORMAL HIGH (ref 70–99)
Glucose-Capillary: 198 mg/dL — ABNORMAL HIGH (ref 70–99)
Glucose-Capillary: 202 mg/dL — ABNORMAL HIGH (ref 70–99)
Glucose-Capillary: 205 mg/dL — ABNORMAL HIGH (ref 70–99)
Glucose-Capillary: 212 mg/dL — ABNORMAL HIGH (ref 70–99)

## 2020-02-18 LAB — CBC
HCT: 25.2 % — ABNORMAL LOW (ref 36.0–46.0)
HCT: 30.5 % — ABNORMAL LOW (ref 36.0–46.0)
HCT: 31.7 % — ABNORMAL LOW (ref 36.0–46.0)
Hemoglobin: 7.5 g/dL — ABNORMAL LOW (ref 12.0–15.0)
Hemoglobin: 9.2 g/dL — ABNORMAL LOW (ref 12.0–15.0)
Hemoglobin: 9.5 g/dL — ABNORMAL LOW (ref 12.0–15.0)
MCH: 33.2 pg (ref 26.0–34.0)
MCH: 32.3 pg (ref 26.0–34.0)
MCH: 32.2 pg (ref 26.0–34.0)
MCHC: 29.8 g/dL — ABNORMAL LOW (ref 30.0–36.0)
MCHC: 30.2 g/dL (ref 30.0–36.0)
MCHC: 30 g/dL (ref 30.0–36.0)
MCV: 111.5 fL — ABNORMAL HIGH (ref 80.0–100.0)
MCV: 107 fL — ABNORMAL HIGH (ref 80.0–100.0)
MCV: 107.5 fL — ABNORMAL HIGH (ref 80.0–100.0)
Platelets: 111 10*3/uL — ABNORMAL LOW (ref 150–400)
Platelets: 119 10*3/uL — ABNORMAL LOW (ref 150–400)
Platelets: 123 10*3/uL — ABNORMAL LOW (ref 150–400)
RBC: 2.26 MIL/uL — ABNORMAL LOW (ref 3.87–5.11)
RBC: 2.85 MIL/uL — ABNORMAL LOW (ref 3.87–5.11)
RBC: 2.95 MIL/uL — ABNORMAL LOW (ref 3.87–5.11)
RDW: 26.1 % — ABNORMAL HIGH (ref 11.5–15.5)
RDW: 25.4 % — ABNORMAL HIGH (ref 11.5–15.5)
RDW: 25.3 % — ABNORMAL HIGH (ref 11.5–15.5)
WBC: 7.2 10*3/uL (ref 4.0–10.5)
WBC: 9 10*3/uL (ref 4.0–10.5)
WBC: 10.9 10*3/uL — ABNORMAL HIGH (ref 4.0–10.5)
nRBC: 0.3 % — ABNORMAL HIGH (ref 0.0–0.2)
nRBC: 0.3 % — ABNORMAL HIGH (ref 0.0–0.2)
nRBC: 0.4 % — ABNORMAL HIGH (ref 0.0–0.2)

## 2020-02-18 LAB — RENAL FUNCTION PANEL
Albumin: 2.5 g/dL — ABNORMAL LOW (ref 3.5–5.0)
Albumin: 2.8 g/dL — ABNORMAL LOW (ref 3.5–5.0)
Anion gap: 9 (ref 5–15)
Anion gap: 10 (ref 5–15)
BUN: 14 mg/dL (ref 6–20)
BUN: 14 mg/dL (ref 6–20)
CO2: 25 mmol/L (ref 22–32)
CO2: 25 mmol/L (ref 22–32)
Calcium: 8.2 mg/dL — ABNORMAL LOW (ref 8.9–10.3)
Calcium: 8.6 mg/dL — ABNORMAL LOW (ref 8.9–10.3)
Chloride: 103 mmol/L (ref 98–111)
Chloride: 101 mmol/L (ref 98–111)
Creatinine, Ser: 1.12 mg/dL — ABNORMAL HIGH (ref 0.44–1.00)
Creatinine, Ser: 1.13 mg/dL — ABNORMAL HIGH (ref 0.44–1.00)
GFR calc Af Amer: >60 mL/min (ref >60)
GFR calc Af Amer: >60 mL/min
GFR calc non Af Amer: 55 mL/min — ABNORMAL LOW (ref >60)
GFR calc non Af Amer: 55 mL/min — ABNORMAL LOW
Glucose, Bld: 167 mg/dL — ABNORMAL HIGH (ref 70–99)
Glucose, Bld: 208 mg/dL — ABNORMAL HIGH (ref 70–99)
Phosphorus: 2.2 mg/dL — ABNORMAL LOW (ref 2.5–4.6)
Phosphorus: 2.5 mg/dL (ref 2.5–4.6)
Potassium: 3.7 mmol/L (ref 3.5–5.1)
Potassium: 4.2 mmol/L (ref 3.5–5.1)
Sodium: 137 mmol/L (ref 135–145)
Sodium: 136 mmol/L (ref 135–145)

## 2020-02-18 LAB — CULTURE, BLOOD (ROUTINE X 2)
Culture: NO GROWTH
Culture: NO GROWTH

## 2020-02-18 LAB — HEPARIN LEVEL (UNFRACTIONATED)
Heparin Unfractionated: <0.1 IU/mL — ABNORMAL LOW (ref 0.30–0.70)
Heparin Unfractionated: <0.1 IU/mL — ABNORMAL LOW (ref 0.30–0.70)

## 2020-02-18 LAB — LACTIC ACID, PLASMA
Lactic Acid, Venous: 1.1 mmol/L (ref 0.5–1.9)
Lactic Acid, Venous: 1.4 mmol/L (ref 0.5–1.9)

## 2020-02-18 LAB — MAGNESIUM: Magnesium: 2.6 mg/dL — ABNORMAL HIGH (ref 1.7–2.4)

## 2020-02-18 LAB — FIBRINOGEN: Fibrinogen: 418 mg/dL (ref 210–475)

## 2020-02-18 LAB — APTT
aPTT: 40 seconds — ABNORMAL HIGH (ref 24–36)
aPTT: 39 seconds — ABNORMAL HIGH (ref 24–36)

## 2020-02-18 LAB — LACTATE DEHYDROGENASE: LDH: 317 U/L — ABNORMAL HIGH (ref 98–192)

## 2020-02-18 LAB — PREPARE RBC (CROSSMATCH)

## 2020-02-18 MED ORDER — SODIUM CHLORIDE 0.9 % IV SOLN
500.0000 [IU]/h | INTRAVENOUS | Status: DC
  Administered 2020-02-18 – 2020-03-04 (×20): 500 [IU]/h via INTRAVENOUS_CENTRAL
  Filled 2020-02-18 (×18): qty 2

## 2020-02-18 MED ORDER — SODIUM CHLORIDE 0.9% IV SOLUTION: Freq: Once | INTRAVENOUS | Status: AC

## 2020-02-18 NOTE — Procedures (Signed)
Extracorporeal support note   ECLS support day: 35 (cannulated 01/22/2020) Indication: Covid 19 pneumonia/ARDS  Configuration: VV  Drainage cannula: right IJ [place]  Return cannula: same  Pump speed: 4300 Pump flow: 5 Pump used: Centrimag  Oxygenator: Quadrox O2 blender: 100% Sweep gas: 7  ABG    Component Value Date/Time   PHART 7.341 (L) 02/18/2020 0416   PCO2ART 49.9 (H) 02/18/2020 0416   PO2ART 80 (L) 02/18/2020 0416   HCO3 26.8 02/18/2020 0416   TCO2 28 02/18/2020 0416   ACIDBASEDEF 1.0 02/15/2020 1947   O2SAT 94.0 02/18/2020 0416    Circuit check: minimal visible clot- only in corners Anticoagulant: Continue heparin Anticoagulation targets: 40-60  Changes in support: con't sweep titration to work towards weep trials &  decannulation  Anticipated goals/duration of support: bridge to recovery  Julian Hy, MD 02/18/20 6:57 AM  Pulmonary & Critical Care

## 2020-02-18 NOTE — Progress Notes (Signed)
Patient ID: Alisha Stephenson, female   DOB: 06/19/1965, 54 y.o.   MRN: [id number]    Advanced Heart Failure Rounding Note   Subjective:    - 8/2 COVID + test - 8/9 Cannulated for VV ECMO - 8/13 with several areas of intracranial hemorrhage. Bival stopped.  - 8/14 CT no change in [place]. Increased edema - 8/16 Extubated - 8/16 Head CT stable bleed - 8/22 CVVHD started - 8/24 reintubated - 8/24 circuit changed - 8/30 trach - 9/7  Switched to iHD - 02/15/20 Switched back to CVVHD - 9/10 GJ tube placed  Remains awake on vent via trach. Following commands and able to move more, working with PT/OT. CVVH ongoing with net UF 50 cc/hr.   She is now off pressors.   ECMO  Flow 4.9 L RPM 4100 Sweep8  Labs: 7.34/50/80/94% Hgb7.5 PLT 104k -> 95k -> 107 -> 111 LDH 489 -> 401 -> 376 -> 326 -> 303 -> 317 Fibrinogen 416 Lactic acid1.1   Objective:   Weight Range:  Vital Signs:   Temp:  [98.3 F (36.8 C)-99.7 F (37.6 C)] 98.3 F (36.8 C) (09/12 0645) Pulse Rate:  [86-99] 95 (09/12 0710) Resp:  [21-22] 21 (09/12 0136) BP: (123)/(104) 123/104 (09/11 1200) SpO2:  [81 %-97 %] 90 % (09/12 0710) Arterial Line BP: (85-134)/(48-102) 123/52 (09/11 1500) FiO2 (%):  [40 %] 40 % (09/12 0136) Weight:  [109.9 kg] 109.9 kg (09/12 0500) Last BM Date: 02/17/20  Weight change: Filed Weights   02/16/20 0452 02/17/20 0500 02/18/20 0500  Weight: 119.8 kg 115.4 kg 109.9 kg    Intake/Output:   Intake/Output Summary (Last 24 hours) at 02/18/2020 0825 Last data filed at 02/18/2020 0710 Gross per 24 hour  Intake 1019.69 ml  Output 1240 ml  Net -220.31 ml     Physical Exam: General: Awake, NAD Neck: No JVD, no thyromegaly or thyroid nodule.  Lungs: Decreased bilaterally.  CV: Nondisplaced PMI.  Heart regular S1/S2, no S3/S4, no murmur.  No peripheral edema.   Abdomen: Soft, nontender, no hepatosplenomegaly, no distention.  Skin: Intact without lesions or rashes.  Neurologic: Alert,  follows commands, able to move arm more.  Extremities: Distal thumb and 1st finger on right hand necrotic.  HEENT: Normal.   Telemetry: Sinus 70-80s Personally reviewed   Labs: Basic Metabolic Panel: Recent Labs  Lab 02/12/20 0405 02/12/20 0516 02/13/20 0314 02/13/20 0315 02/16/20 0421 02/16/20 0436 02/16/20 1744 02/16/20 1744 02/16/20 1951 02/17/20 0218 02/17/20 0219 02/17/20 0440 02/17/20 1603 02/17/20 1603 02/17/20 1723 02/17/20 1725 02/17/20 2057 02/18/20 0400 02/18/20 0416  NA 137   < > 137   < > 137   < > 140   < >   < >  --  136   < > 136   < > 135 138 139 137 139  K 4.0   < > 3.8   < > 3.7   < > 4.0   < >   < >  --  3.5   < > 4.3   < > 4.0 4.2 4.0 3.7 3.7  CL 102   < > 100   < > 103   < > 106  --   --   --  103  --  101  --  100  --   --  103  --   CO2 22   < > 23   < > 23   < > 23  --   --   --  22  --  23  --  23  --   --  25  --   GLUCOSE 126*   < > 194*   < > 147*   < > 130*  --   --   --  97  --  153*  --  145*  --   --  167*  --   BUN 14   < > 28*   < > 35*   < > 26*  --   --   --  19  --  17  --  16  --   --  14  --   CREATININE 0.91   < > 1.32*   < > 1.75*   < > 1.60*  --   --   --  1.29*  --  1.21*  --  1.29*  --   --  1.12*  --   CALCIUM 8.5*   < > 8.6*   < > 8.3*   < > 8.4*   < >  --   --  8.0*   < > 8.5*  --  8.7*  --   --  8.2*  --   MG 2.7*  --  2.9*  --  2.6*  --   --   --   --  2.5*  --   --   --   --   --   --   --  2.6*  --   PHOS 2.5   < > 3.7   < > 2.7  --  3.3  --   --   --  2.1*  --  2.9  --   --   --   --  2.2*  --    < > = values in this interval not displayed.    Liver Function Tests: Recent Labs  Lab 02/16/20 0421 02/16/20 1744 02/17/20 0219 02/17/20 1603 02/18/20 0400  ALBUMIN 2.3* 2.4* 2.2* 2.9* 2.5*   No results for input(s): LIPASE, AMYLASE in the last 168 hours. No results for input(s): AMMONIA in the last 168 hours.  CBC: Recent Labs  Lab 02/16/20 0421 02/16/20 0436 02/16/20 1744 02/16/20 1951 02/17/20 0218  02/17/20 0440 02/17/20 1723 02/17/20 1725 02/17/20 2057 02/18/20 0400 02/18/20 0416  WBC 7.0  --  7.4  --  7.0  --  13.6*  --   --  7.2  --   HGB 8.1*   < > 8.0*   < > 8.0*   < > 8.5* 9.5* 8.2* 7.5* 7.8*  HCT 27.0*   < > 27.5*   < > 27.0*   < > 28.5* 28.0* 24.0* 25.2* 23.0*  MCV 111.6*  --  112.7*  --  113.4*  --  110.9*  --   --  111.5*  --   PLT 102*  --  103*  --  107*  --  130*  --   --  111*  --    < > = values in this interval not displayed.    Cardiac Enzymes: No results for input(s): CKTOTAL, CKMB, CKMBINDEX, TROPONINI in the last 168 hours.  BNP: BNP (last 3 results) No results for input(s): BNP in the last 8760 hours.  ProBNP (last 3 results) No results for input(s): PROBNP in the last 8760 hours.    Other results:  Imaging: IR GASTROSTOMY TUBE MOD SED  Result Date: 02/16/2020 INDICATION: 54 year old female currently on ECMO and at risk for aspiration. She requires percutaneous  gastrojejunostomy access to allow for enteral feeding. EXAM: Fluoroscopically guided placement of percutaneous pull-through gastrostomy tube Conversion to gastrojejunostomy tube Interventional Radiologist:  Criselda Peaches, MD MEDICATIONS: 2 g Ancef; Antibiotics were administered within 1 hour of the procedure. ANESTHESIA/SEDATION: Versed 2 mg IV; Fentanyl 50 mcg IV Moderate Sedation Time:  22 minutes The patient was continuously monitored during the procedure by the interventional radiology nurse under my direct supervision. CONTRAST:  22m OMNIPAQUE IOHEXOL 300 MG/ML  SOLN FLUOROSCOPY TIME:  Fluoroscopy Time: 8 minutes 42 seconds (145 mGy). COMPLICATIONS: None immediate. PROCEDURE: Informed written consent was obtained from the patient after a thorough discussion of the procedural risks, benefits and alternatives. All questions were addressed. Maximal Sterile Barrier Technique was utilized including caps, mask, sterile gowns, sterile gloves, sterile drape, hand hygiene and skin antiseptic. A  timeout was performed prior to the initiation of the procedure. Maximal barrier sterile technique utilized including caps, mask, sterile gowns, sterile gloves, large sterile drape, hand hygiene, and chlorhexadine skin prep. An angled catheter was advanced over a wire under fluoroscopic guidance through the nose, down the esophagus and into the body of the stomach. The stomach was then insufflated with several 100 ml of air. Fluoroscopy confirmed location of the gastric bubble, as well as inferior displacement of the barium stained colon. Under direct fluoroscopic guidance, a single T-tack was placed, and the anterior gastric wall drawn up against the anterior abdominal wall. Percutaneous access was then obtained into the mid gastric body with an 18 gauge sheath needle. Aspiration of air, and injection of contrast material under fluoroscopy confirmed needle placement. An Amplatz wire was advanced in the gastric body and the access needle exchanged for a 9-French vascular sheath. A snare device was advanced through the vascular sheath and an Amplatz wire advanced through the angled catheter. The Amplatz wire was successfully snared and this was pulled up through the esophagus and out the mouth. A 20-French KAlinda DoomsMIC-PEG tube was then connected to the snare and pulled through the mouth, down the esophagus, into the stomach and out to the anterior abdominal wall. Hand injection of contrast material confirmed intragastric location. The T-tack retention suture was then cut. A C2 cobra catheter and Glidewire were then advanced coaxially through the gastrostomy tube into the stomach. The catheter and wire combination was successfully advanced through the pylorus and into the proximal small bowel. A 9 French jejunal limb was then advanced over the wire and position with the tip in the proximal jejunum. A gentle hand injection of contrast material was performed confirming the location of the tube. The external bumper  was secured in place. The tube was flushed through the jejunal and gastric lumen and both were capped. The patient will be observed for several hours with the newly placed tube on low wall suction to evaluate for any post procedure complication. The patient tolerated the procedure well, there is no immediate complication. IMPRESSION: Successful placement of a 20 French pull through gastrostomy tube converted to a gastro jejunostomy tumor with the addition of a 9 FP[place]jejunal arm. The J arm tip is in the proximal jejunum and ready for use. Electronically Signed   By: HJacqulynn CadetM.D.   On: 02/16/2020 16:41   IR GASTR TUBE CONVERT GASTR-JEJ PER W/FL MOD SED  Result Date: 02/16/2020 INDICATION: 54year old female currently on ECMO and at risk for aspiration. She requires percutaneous gastrojejunostomy access to allow for enteral feeding. EXAM: Fluoroscopically guided placement of percutaneous pull-through gastrostomy tube Conversion to  gastrojejunostomy tube Interventional Radiologist:  Criselda Peaches, MD MEDICATIONS: 2 g Ancef; Antibiotics were administered within 1 hour of the procedure. ANESTHESIA/SEDATION: Versed 2 mg IV; Fentanyl 50 mcg IV Moderate Sedation Time:  22 minutes The patient was continuously monitored during the procedure by the interventional radiology nurse under my direct supervision. CONTRAST:  84m OMNIPAQUE IOHEXOL 300 MG/ML  SOLN FLUOROSCOPY TIME:  Fluoroscopy Time: 8 minutes 42 seconds (145 mGy). COMPLICATIONS: None immediate. PROCEDURE: Informed written consent was obtained from the patient after a thorough discussion of the procedural risks, benefits and alternatives. All questions were addressed. Maximal Sterile Barrier Technique was utilized including caps, mask, sterile gowns, sterile gloves, sterile drape, hand hygiene and skin antiseptic. A timeout was performed prior to the initiation of the procedure. Maximal barrier sterile technique utilized including caps, mask,  sterile gowns, sterile gloves, large sterile drape, hand hygiene, and chlorhexadine skin prep. An angled catheter was advanced over a wire under fluoroscopic guidance through the nose, down the esophagus and into the body of the stomach. The stomach was then insufflated with several 100 ml of air. Fluoroscopy confirmed location of the gastric bubble, as well as inferior displacement of the barium stained colon. Under direct fluoroscopic guidance, a single T-tack was placed, and the anterior gastric wall drawn up against the anterior abdominal wall. Percutaneous access was then obtained into the mid gastric body with an 18 gauge sheath needle. Aspiration of air, and injection of contrast material under fluoroscopy confirmed needle placement. An Amplatz wire was advanced in the gastric body and the access needle exchanged for a 9-French vascular sheath. A snare device was advanced through the vascular sheath and an Amplatz wire advanced through the angled catheter. The Amplatz wire was successfully snared and this was pulled up through the esophagus and out the mouth. A 20-French KAlinda DoomsMIC-PEG tube was then connected to the snare and pulled through the mouth, down the esophagus, into the stomach and out to the anterior abdominal wall. Hand injection of contrast material confirmed intragastric location. The T-tack retention suture was then cut. A C2 cobra catheter and Glidewire were then advanced coaxially through the gastrostomy tube into the stomach. The catheter and wire combination was successfully advanced through the pylorus and into the proximal small bowel. A 9 French jejunal limb was then advanced over the wire and position with the tip in the proximal jejunum. A gentle hand injection of contrast material was performed confirming the location of the tube. The external bumper was secured in place. The tube was flushed through the jejunal and gastric lumen and both were capped. The patient will be  observed for several hours with the newly placed tube on low wall suction to evaluate for any post procedure complication. The patient tolerated the procedure well, there is no immediate complication. IMPRESSION: Successful placement of a 20 French pull through gastrostomy tube converted to a gastro jejunostomy tumor with the addition of a 9 [place]unal arm. The J arm tip is in the proximal jejunum and ready for use. Electronically Signed   By: HJacqulynn CadetM.D.   On: 02/16/2020 16:41   DG CHEST PORT 1 VIEW  Result Date: 02/17/2020 CLINICAL DATA:  Cardio respiratory failure, ECMO. EXAM: PORTABLE CHEST 1 VIEW COMPARISON:  02/16/2020 FINDINGS: RIGHT-sided PICC line obscured by ECMO cannula, extends into the mid to distal superior vena cava. LEFT-sided dual lumen catheter terminates at the proximal portion of the superior vena cava. ECMO cannula in stable position. Slightly improved aeration  in the chest compared to the previous study. There is still diffuse interstitial and airspace disease largely obscuring the cardiomediastinal silhouette. Tracheostomy tube remains in place. Bilateral effusions largely stable. No acute skeletal process to the extent evaluated. IMPRESSION: 1. Stable support lines and tubes. 2. Slightly improved aeration in the chest compared to the previous study. 3. Stable bilateral effusions and with persistent diffuse interstitial and airspace disease. Electronically Signed   By: Zetta Bills M.D.   On: 02/17/2020 09:53     Medications:     Scheduled Medications: . amiodarone  200 mg Per Tube Daily  . vitamin C  500 mg Per Tube Daily  . chlorhexidine gluconate (MEDLINE KIT)  15 mL Mouth Rinse BID  . Chlorhexidine Gluconate Cloth  6 each Topical Q0600  . clonazePAM  1 mg Per Tube Q6H  . clotrimazole  1 Applicatorful Vaginal QHS  . docusate  100 mg Per Tube BID  . feeding supplement (PROSource TF)  45 mL Per Tube QID  . Gerhardt's butt cream   Topical BID  .  linagliptin  5 mg Per Tube Daily  . lipase/protease/amylase)  20,880 Units Per Tube Once   And  . sodium bicarbonate  650 mg Per Tube Once  . mouth rinse  15 mL Mouth Rinse 10 times per day  . melatonin  9 mg Per Tube QHS  . midodrine  10 mg Per Tube TID  . multivitamin  1 tablet Per Tube QHS  . oxyCODONE  10 mg Per Tube Q6H  . pantoprazole (PROTONIX) IV  40 mg Intravenous BID  . polyethylene glycol  17 g Per Tube Daily  . QUEtiapine  50 mg Per Tube QHS  . sennosides  5 mL Per Tube BID  . zinc sulfate  220 mg Per Tube Daily    Infusions: .  prismasol BGK 4/2.5 400 mL/hr at 02/17/20 1942  .  prismasol BGK 4/2.5 200 mL/hr at 02/17/20 1942  . sodium chloride 500 mL (02/11/20 2215)  . sodium chloride 10 mL/hr at 02/18/20 0700  . sodium chloride    . sodium chloride    . albumin human 12.5 g (02/17/20 1126)  . albumin human Stopped (02/06/20 2000)  . feeding supplement (PIVOT 1.5 CAL) 30 mL/hr at 02/18/20 0700  . heparin 10,000 units/ 20 mL infusion syringe    . heparin 500 Units/hr (02/18/20 0700)  . insulin 1.2 mL/hr at 02/18/20 0700  . norepinephrine (LEVOPHED) Adult infusion Stopped (02/17/20 1948)  . phenylephrine (NEO-SYNEPHRINE) Adult infusion Stopped (02/13/20 0155)  . prismasol BGK 4/2.5 2,000 mL/hr at 02/18/20 0644    PRN Medications: sodium chloride, sodium chloride, sodium chloride, sodium chloride, acetaminophen (TYLENOL) oral liquid 160 mg/5 mL, albumin human, albuterol, alteplase, alum & mag hydroxide-simeth, dextrose, fentaNYL (SUBLIMAZE) injection, guaiFENesin-dextromethorphan, heparin, heparin, heparin, hydrALAZINE, HYDROmorphone (DILAUDID) injection, hydrOXYzine, lidocaine (PF), lidocaine-prilocaine, ondansetron **OR** ondansetron (ZOFRAN) IV, pentafluoroprop-tetrafluoroeth   Assessment/Plan:   1. Acute hypoxic/hypercapneic respiratory failure in setting of severe COVID PNA/ARDS -> VV ECMO - admit 8/2 - intubation 8/3 - has received actmera (compelted 8/2),  remdesivir (completed 8/6) and steroids - Cannulated for VV ECMO on 8/9 - Extubated 8/16. Reintubated 8/24 - s/p trach 8/30. - Circuit changed 8/24 due to concern for hemolysis and worsening oxygenation. - CVVHD started 8/22 for volume removal and uremia. Remains anuric. CXR worse on 9/9 so switched back to CVVHD.  Will continue UF -50 cc/hr net today.  - Remains on low-dose heparin. No bleeding. Will change PTT goal  to 40-60, increase heparin gtt today.  - Off IV sedation. Continue melatonin and seroquel for sleep at night  - Off pressors and on midodrine 10 tid - CXR slowly improving.  - Main issues remain to be poor lung recovery, critical care myopathy and AKI. Continue slow wean as tolerated.   2. Enterococcus sepsis - Finished high-dose zosyn on 9/7 per ID - Eraxis added on 8/26 with yeast in BAL 8/24 (ended 9/2)  - Cultures repeated with increasing pressor demands - F/u cx 9/7 Remain NGTD.   3. Intracranial hemorrhage - ? Septic emboli - repeat head CT on 8/14 with stable bleeds but increased edema - neurology has seen. Suspect significant long-term injury sustained. Will follow commands. Appears to have dense LUE weakness and possibly LLE - repeat head CT stable 8/16 - tolerating low-dose heparin, PTT goal 40-60 as above.   4. Thrombocytopenia - PLTs ~95k-110K  - Continue to follow. No change  5. Morbid obesity - Body mass index is Body mass index is 41.59 kg/m.  6. Poorly controlled DM2 - HgBA1c 10.7 - On insulin gtt  7. PAF - intermittent episodes. Last 8/26 - Now on po. Quiescent  8. AKI/azotemia - CVVHD started on 8/22 - Down 70 pounds.  - transitioned to [facility] on 9/7 - switched back to CVVHD for better volume removal on 9/9. - new catheter placed 9/9 - will eventually need tunneled access - Continue UF net 50 cc/hr via CVVH today  9. Emesis - Cor-trak out.  - Has GJ tube now, slowly increase TFs.   10. Sacral decub  - Wound care following  11.  Necrotic fingers - Will need vascular consult.   12. Anemia - Transfuse hgb < 8, 1 unit PRBCs today.   CRITICAL CARE Performed by: Loralie Champagne  Total critical care time: 35 minutes  Critical care time was exclusive of separately billable procedures and treating other patients.  Critical care was necessary to treat or prevent imminent or life-threatening deterioration.  Critical care was time spent personally by me (independent of midlevel providers or residents) on the following activities: development of treatment plan with patient and/or surrogate as well as nursing, discussions with consultants, evaluation of patient's response to treatment, examination of patient, obtaining history from patient or surrogate, ordering and performing treatments and interventions, ordering and review of laboratory studies, ordering and review of radiographic studies, pulse oximetry and re-evaluation of patient's condition.   Length of Stay: 93   Loralie Champagne  MD 02/18/2020, 8:25 AM  Advanced Heart Failure Team Pager [phone] (M-F; [place])  Please contact [facility] for night-coverage after hours (4p -7a ) and weekends on [email or web address]

## 2020-02-18 NOTE — Progress Notes (Signed)
ANTICOAGULATION CONSULT NOTE - [place] for Heparin Indication: ECMO  Allergies  Allergen Reactions  . Sulfa Antibiotics Rash and Other (See Comments)    Patient Measurements: Height: 5\' 4"  (162.6 cm) Weight: 109.9 kg (242 lb 4.6 oz) IBW/kg (Calculated) : 54.7 Heparin Dosing Weight: 87 kg  Vital Signs: Temp: 98.2 F (36.8 C) (09/12 1000) Temp Source: Oral (09/12 1000) Pulse Rate: 97 (09/12 1430)  Labs: Recent Labs    02/17/20 0431 02/17/20 0440 02/17/20 1723 02/17/20 1725 02/18/20 0400 02/18/20 0416 02/18/20 1255 02/18/20 1255 02/18/20 1639 02/18/20 1641  HGB  --    < > 8.5*   < > 7.5*   < > 9.2*   < > 10.5* 9.5*  HCT  --    < > 28.5*   < > 25.2*   < > 30.5*  --  31.0* 31.7*  PLT  --    < > 130*   < > 111*  --  119*  --   --  123*  APTT 36   < > 36  --  40*  --   --   --   --  39*  HEPARINUNFRC <0.10*  --   --   --  <0.10*  --   --   --   --  <0.10*  CREATININE  --    < > 1.29*  --  1.12*  --   --   --   --  1.13*   < > = values in this interval not displayed.    Estimated Creatinine Clearance: 68.2 mL/min (A) (by C-G formula based on SCr of 1.13 mg/dL (H)).   Medical History: Past Medical History:  Diagnosis Date  . Asthma   . Diabetes mellitus without complication ([place])   . Diverticulitis   . Gallstones   . IBS (irritable bowel syndrome)   . NAFLD (nonalcoholic fatty liver disease)    CT scan 2015  . Ovarian cyst     Assessment: 54 yo female COVID+ on VV ECMO. Patient started on bivalirudin but was found to have multiple ICH on 8/13 head CT. Bivalirudin was stopped and pt was off anticoagulation at that time. Her ECMO circuit was changed 8/24. Pharmacy asked to start low-fixed rate heparin. Heparin infusion was held last week due to bleeding from trach site.  Heparin level undetectable this am which has been the trend. Issues with clotting off crrt so will start to titrate heparin to a low goal of 40-60s by aptt (~0.15-0.3 by  heparin levels). Hgb 7.8 this am. Plt stable. No bleeding issues noted.   Repeat heparin level remains undetectable, aPTT is 39 seconds (unchanged from this morning). Will increase heparin slightly and recheck with morning labs.  Goal of Therapy:  Aptt goal 40-60s Monitor platelets by anticoagulation protocol: Yes   Plan:  Increase heparin infusion to 700 units/hr Monitor q12 hours aptt for now, daily  CBC, LDH, fibrinogen, s/sx of bleeding  Arrie Senate, PharmD, BCPS Clinical Pharmacist [phone] Please check AMION for all [place] numbers 02/18/2020

## 2020-02-18 NOTE — Progress Notes (Addendum)
[facility] Progress Note    Assessment/ Plan:    49F severe MSOF from COVID-19 pneumonia requiring VV ECMO.  1.  Anuric AKI due to shock, sepsis causing acute tubular necrosis:CRRT on 8/22- 02/12/20. IHD 02/13/20.  Planned for 9/9 but back to CRRT d/t fluid overload.  All 4K, adding flat rate heparin 500 u/ hr to circuit today d/t clotting x 2.  L IJ nontunneled catheter changed 9/9 from L [place].  Pull net neg 50 mL/ hr.    2.Shock due to sepsis, Covid infection, blood cultures positive for Enterococcus bacteremia: s/p Zosyn (ended 9/7), cultures repeated 9/7, NGTD.  TTE 9/7 no AI, done for concern of wide pulse pressure  3.Acute hypoxic and hypercapnic respiratory failure, ARDS due to COVID-19 pneumonia: Currently on VV ECMO for refractory hypoxemia.  3.Hypernatremia, hypervolemia: relative free water deficit. Improved with CRRT.  4.Acute toxic metabolic encephalopathy:Multifactorial, improving  5.Anemia, thrombocytopenia: With hemolysis likely and acute illness contributing. Transfusions per primary team.  6.Intracranial hemorrhage: With left-sided weakness, ? Septic emboli  7.  Diabetes per primary  8.  Hypophosphatemia improved  9.  Critical illness myopathy: working on PT/ standing  Subjective:    Continues to make some small improvements.  Got GJ tube yesterday.   Objective:   BP (!) 123/104 (BP Location: Left Arm)   Pulse 95   Temp 98.3 F (36.8 C) (Oral)   Resp (!) 21   Ht 5' 4"  (1.626 m)   Wt 109.9 kg   SpO2 90%   BMI 41.59 kg/m   Intake/Output Summary (Last 24 hours) at 02/18/2020 0916 Last data filed at 02/18/2020 0900 Gross per 24 hour  Intake 1004.09 ml  Output 1377 ml  Net -372.91 ml   Weight change: -5.5 kg  Physical Exam: Gen: critically ill, lying in bed NECK: R IJ ECMO access CVS: RRR Resp: mech bilaterally Abd: obese Ext: 1+ edema bilaterally ACCESS: L IJ nontunneled HD catheter MSK: necrotic thumb and 1st finger R  hand  Imaging: IR GASTROSTOMY TUBE MOD SED  Result Date: 02/16/2020 INDICATION: 54 year old female currently on ECMO and at risk for aspiration. She requires percutaneous gastrojejunostomy access to allow for enteral feeding. EXAM: Fluoroscopically guided placement of percutaneous pull-through gastrostomy tube Conversion to gastrojejunostomy tube Interventional Radiologist:  Criselda Peaches, MD MEDICATIONS: 2 g Ancef; Antibiotics were administered within 1 hour of the procedure. ANESTHESIA/SEDATION: Versed 2 mg IV; Fentanyl 50 mcg IV Moderate Sedation Time:  22 minutes The patient was continuously monitored during the procedure by the interventional radiology nurse under my direct supervision. CONTRAST:  30m OMNIPAQUE IOHEXOL 300 MG/ML  SOLN FLUOROSCOPY TIME:  Fluoroscopy Time: 8 minutes 42 seconds (145 mGy). COMPLICATIONS: None immediate. PROCEDURE: Informed written consent was obtained from the patient after a thorough discussion of the procedural risks, benefits and alternatives. All questions were addressed. Maximal Sterile Barrier Technique was utilized including caps, mask, sterile gowns, sterile gloves, sterile drape, hand hygiene and skin antiseptic. A timeout was performed prior to the initiation of the procedure. Maximal barrier sterile technique utilized including caps, mask, sterile gowns, sterile gloves, large sterile drape, hand hygiene, and chlorhexadine skin prep. An angled catheter was advanced over a wire under fluoroscopic guidance through the nose, down the esophagus and into the body of the stomach. The stomach was then insufflated with several 100 ml of air. Fluoroscopy confirmed location of the gastric bubble, as well as inferior displacement of the barium stained colon. Under direct fluoroscopic guidance, a single T-tack was placed,  and the anterior gastric wall drawn up against the anterior abdominal wall. Percutaneous access was then obtained into the mid gastric body with an 18  gauge sheath needle. Aspiration of air, and injection of contrast material under fluoroscopy confirmed needle placement. An Amplatz wire was advanced in the gastric body and the access needle exchanged for a 9-French vascular sheath. A snare device was advanced through the vascular sheath and an Amplatz wire advanced through the angled catheter. The Amplatz wire was successfully snared and this was pulled up through the esophagus and out the mouth. A 20-French Alinda Dooms MIC-PEG tube was then connected to the snare and pulled through the mouth, down the esophagus, into the stomach and out to the anterior abdominal wall. Hand injection of contrast material confirmed intragastric location. The T-tack retention suture was then cut. A C2 cobra catheter and Glidewire were then advanced coaxially through the gastrostomy tube into the stomach. The catheter and wire combination was successfully advanced through the pylorus and into the proximal small bowel. A 9 French jejunal limb was then advanced over the wire and position with the tip in the proximal jejunum. A gentle hand injection of contrast material was performed confirming the location of the tube. The external bumper was secured in place. The tube was flushed through the jejunal and gastric lumen and both were capped. The patient will be observed for several hours with the newly placed tube on low wall suction to evaluate for any post procedure complication. The patient tolerated the procedure well, there is no immediate complication. IMPRESSION: Successful placement of a 20 French pull through gastrostomy tube converted to a gastro jejunostomy tumor with the addition of a 9 [place] jejunal arm. The J arm tip is in the proximal jejunum and ready for use. Electronically Signed   By: Jacqulynn Cadet M.D.   On: 02/16/2020 16:41   IR GASTR TUBE CONVERT GASTR-JEJ PER W/FL MOD SED  Result Date: 02/16/2020 INDICATION: 54 year old female currently on ECMO and at  risk for aspiration. She requires percutaneous gastrojejunostomy access to allow for enteral feeding. EXAM: Fluoroscopically guided placement of percutaneous pull-through gastrostomy tube Conversion to gastrojejunostomy tube Interventional Radiologist:  Criselda Peaches, MD MEDICATIONS: 2 g Ancef; Antibiotics were administered within 1 hour of the procedure. ANESTHESIA/SEDATION: Versed 2 mg IV; Fentanyl 50 mcg IV Moderate Sedation Time:  22 minutes The patient was continuously monitored during the procedure by the interventional radiology nurse under my direct supervision. CONTRAST:  62m OMNIPAQUE IOHEXOL 300 MG/ML  SOLN FLUOROSCOPY TIME:  Fluoroscopy Time: 8 minutes 42 seconds (145 mGy). COMPLICATIONS: None immediate. PROCEDURE: Informed written consent was obtained from the patient after a thorough discussion of the procedural risks, benefits and alternatives. All questions were addressed. Maximal Sterile Barrier Technique was utilized including caps, mask, sterile gowns, sterile gloves, sterile drape, hand hygiene and skin antiseptic. A timeout was performed prior to the initiation of the procedure. Maximal barrier sterile technique utilized including caps, mask, sterile gowns, sterile gloves, large sterile drape, hand hygiene, and chlorhexadine skin prep. An angled catheter was advanced over a wire under fluoroscopic guidance through the nose, down the esophagus and into the body of the stomach. The stomach was then insufflated with several 100 ml of air. Fluoroscopy confirmed location of the gastric bubble, as well as inferior displacement of the barium stained colon. Under direct fluoroscopic guidance, a single T-tack was placed, and the anterior gastric wall drawn up against the anterior abdominal wall. Percutaneous access was then obtained into  the mid gastric body with an 18 gauge sheath needle. Aspiration of air, and injection of contrast material under fluoroscopy confirmed needle placement. An  Amplatz wire was advanced in the gastric body and the access needle exchanged for a 9-French vascular sheath. A snare device was advanced through the vascular sheath and an Amplatz wire advanced through the angled catheter. The Amplatz wire was successfully snared and this was pulled up through the esophagus and out the mouth. A 20-French Alinda Dooms MIC-PEG tube was then connected to the snare and pulled through the mouth, down the esophagus, into the stomach and out to the anterior abdominal wall. Hand injection of contrast material confirmed intragastric location. The T-tack retention suture was then cut. A C2 cobra catheter and Glidewire were then advanced coaxially through the gastrostomy tube into the stomach. The catheter and wire combination was successfully advanced through the pylorus and into the proximal small bowel. A 9 French jejunal limb was then advanced over the wire and position with the tip in the proximal jejunum. A gentle hand injection of contrast material was performed confirming the location of the tube. The external bumper was secured in place. The tube was flushed through the jejunal and gastric lumen and both were capped. The patient will be observed for several hours with the newly placed tube on low wall suction to evaluate for any post procedure complication. The patient tolerated the procedure well, there is no immediate complication. IMPRESSION: Successful placement of a 20 French pull through gastrostomy tube converted to a gastro jejunostomy tumor with the addition of a 9 [place] jejunal arm. The J arm tip is in the proximal jejunum and ready for use. Electronically Signed   By: Jacqulynn Cadet M.D.   On: 02/16/2020 16:41   DG CHEST PORT 1 VIEW  Result Date: 02/18/2020 CLINICAL DATA:  Cardio respiratory failure.  ECMO catheter present. EXAM: PORTABLE CHEST 1 VIEW COMPARISON:  February 17, 2020 FINDINGS: Tracheostomy catheter tip is 5.6 cm above the carina. ECMO catheter  seen on the right with tip below the diaphragm. Central catheter tip from the right side in the superior vena cava. Left-sided central catheter tip is in the superior vena cava. No pneumothorax. There is diffuse interstitial and airspace opacity bilaterally, somewhat more on the right than on the left. Apparent superimposed pleural effusions noted. Heart upper normal in size with pulmonary vascularity grossly normal. No adenopathy evident. Postoperative change lower cervical spine. IMPRESSION: Tube and catheter positions as described without appreciable pneumothorax. Persistent pleural effusions with multifocal interstitial and alveolar opacity, similar to 1 day prior. Stable cardiac silhouette. Electronically Signed   By: Lowella Grip III M.D.   On: 02/18/2020 09:05   DG CHEST PORT 1 VIEW  Result Date: 02/17/2020 CLINICAL DATA:  Cardio respiratory failure, ECMO. EXAM: PORTABLE CHEST 1 VIEW COMPARISON:  02/16/2020 FINDINGS: RIGHT-sided PICC line obscured by ECMO cannula, extends into the mid to distal superior vena cava. LEFT-sided dual lumen catheter terminates at the proximal portion of the superior vena cava. ECMO cannula in stable position. Slightly improved aeration in the chest compared to the previous study. There is still diffuse interstitial and airspace disease largely obscuring the cardiomediastinal silhouette. Tracheostomy tube remains in place. Bilateral effusions largely stable. No acute skeletal process to the extent evaluated. IMPRESSION: 1. Stable support lines and tubes. 2. Slightly improved aeration in the chest compared to the previous study. 3. Stable bilateral effusions and with persistent diffuse interstitial and airspace disease. Electronically Signed   By:  Zetta Bills M.D.   On: 02/17/2020 09:53    Labs: BMET Recent Labs  Lab 02/15/20 0407 02/15/20 1207 02/15/20 1614 02/15/20 1947 02/16/20 0421 02/16/20 0436 02/16/20 1744 02/16/20 1951 02/17/20 0219 02/17/20 0440  02/17/20 1139 02/17/20 1603 02/17/20 1723 02/17/20 1725 02/17/20 2057 02/18/20 0400 02/18/20 0416  NA 138   < > 138   < > 137   < > 140   < > 136   < > 137 136 135 138 139 137 139  K 3.8   < > 4.2   < > 3.7   < > 4.0   < > 3.5   < > 4.4 4.3 4.0 4.2 4.0 3.7 3.7  CL 103  --  104  --  103  --  106  --  103  --   --  101 100  --   --  103  --   CO2 19*  --  21*  --  23  --  23  --  22  --   --  23 23  --   --  25  --   GLUCOSE 186*  --  146*  --  147*  --  130*  --  97  --   --  153* 145*  --   --  167*  --   BUN 58*  --  48*  --  35*  --  26*  --  19  --   --  17 16  --   --  14  --   CREATININE 2.93*  --  2.36*  --  1.75*  --  1.60*  --  1.29*  --   --  1.21* 1.29*  --   --  1.12*  --   CALCIUM 8.7*  --  8.5*  --  8.3*  --  8.4*  --  8.0*  --   --  8.5* 8.7*  --   --  8.2*  --   PHOS 5.2*  --  4.1  --  2.7  --  3.3  --  2.1*  --   --  2.9  --   --   --  2.2*  --    < > = values in this interval not displayed.   CBC Recent Labs  Lab 02/16/20 1744 02/16/20 1951 02/17/20 0218 02/17/20 0440 02/17/20 1723 02/17/20 1723 02/17/20 1725 02/17/20 2057 02/18/20 0400 02/18/20 0416  WBC 7.4  --  7.0  --  13.6*  --   --   --  7.2  --   HGB 8.0*   < > 8.0*   < > 8.5*   < > 9.5* 8.2* 7.5* 7.8*  HCT 27.5*   < > 27.0*   < > 28.5*   < > 28.0* 24.0* 25.2* 23.0*  MCV 112.7*  --  113.4*  --  110.9*  --   --   --  111.5*  --   PLT 103*  --  107*  --  130*  --   --   --  111*  --    < > = values in this interval not displayed.    Medications:    . amiodarone  200 mg Per Tube Daily  . vitamin C  500 mg Per Tube Daily  . chlorhexidine gluconate (MEDLINE KIT)  15 mL Mouth Rinse BID  . Chlorhexidine Gluconate Cloth  6 each Topical Q0600  . clonazePAM  1 mg Per Tube  Q6H  . clotrimazole  1 Applicatorful Vaginal QHS  . docusate  100 mg Per Tube BID  . feeding supplement (PROSource TF)  45 mL Per Tube QID  . Gerhardt's butt cream   Topical BID  . linagliptin  5 mg Per Tube Daily  .  lipase/protease/amylase)  20,880 Units Per Tube Once   And  . sodium bicarbonate  650 mg Per Tube Once  . mouth rinse  15 mL Mouth Rinse 10 times per day  . melatonin  9 mg Per Tube QHS  . midodrine  10 mg Per Tube TID  . multivitamin  1 tablet Per Tube QHS  . oxyCODONE  10 mg Per Tube Q6H  . pantoprazole (PROTONIX) IV  40 mg Intravenous BID  . polyethylene glycol  17 g Per Tube Daily  . QUEtiapine  50 mg Per Tube QHS  . sennosides  5 mL Per Tube BID  . zinc sulfate  220 mg Per Tube Daily      Madelon Lips MD 02/18/2020, 9:16 AM

## 2020-02-18 NOTE — Progress Notes (Signed)
ANTICOAGULATION CONSULT NOTE - [place] for Heparin Indication: ECMO  Allergies  Allergen Reactions  . Sulfa Antibiotics Rash and Other (See Comments)    Patient Measurements: Height: 5\' 4"  (162.6 cm) Weight: 109.9 kg (242 lb 4.6 oz) IBW/kg (Calculated) : 54.7 Heparin Dosing Weight: 87 kg  Vital Signs: Temp: 98.2 F (36.8 C) (09/12 1000) Temp Source: Oral (09/12 1000) Pulse Rate: 92 (09/12 1000)  Labs: Recent Labs    02/17/20 0218 02/17/20 0219 02/17/20 0431 02/17/20 0440 02/17/20 1603 02/17/20 1723 02/17/20 1725 02/18/20 0400 02/18/20 0400 02/18/20 0416 02/18/20 0416 02/18/20 0927 02/18/20 1255  HGB   < >  --   --    < >  --  8.5*   < > 7.5*   < > 7.8*   < > 9.9* 9.2*  HCT   < >  --   --    < >  --  28.5*   < > 25.2*   < > 23.0*  --  29.0* 30.5*  PLT   < >  --   --   --   --  130*  --  111*  --   --   --   --  119*  APTT   < >  --  36  --   --  36  --  40*  --   --   --   --   --   HEPARINUNFRC  --  0.67 <0.10*  --   --   --   --  <0.10*  --   --   --   --   --   CREATININE  --  1.29*  --    < > 1.21* 1.29*  --  1.12*  --   --   --   --   --    < > = values in this interval not displayed.    Estimated Creatinine Clearance: 68.8 mL/min (A) (by C-G formula based on SCr of 1.12 mg/dL (H)).   Medical History: Past Medical History:  Diagnosis Date  . Asthma   . Diabetes mellitus without complication ([place])   . Diverticulitis   . Gallstones   . IBS (irritable bowel syndrome)   . NAFLD (nonalcoholic fatty liver disease)    CT scan 2015  . Ovarian cyst     Assessment: 54 yo female COVID+ on VV ECMO. Patient started on bivalirudin but was found to have multiple ICH on 8/13 head CT. Bivalirudin was stopped and pt was off anticoagulation at that time. Her ECMO circuit was changed 8/24. Pharmacy asked to start low-fixed rate heparin. Heparin infusion was held last week due to bleeding from trach site.  Heparin level undetectable this am  which has been the trend. Issues with clotting off crrt so will start to titrate heparin to a low goal of 40-60s by aptt (~0.15-0.3 by heparin levels). Hgb 7.8 this am. Plt stable. No bleeding issues noted.   Goal of Therapy:  Aptt goal 40-60s Monitor platelets by anticoagulation protocol: Yes   Plan:  Increase heparin infusion to 600 units/hr Monitor q12 hours aptt for now, daily  CBC, LDH, fibrinogen, s/sx of bleeding  Erin Hearing PharmD., BCPS Clinical Pharmacist 02/18/2020 1:27 PM

## 2020-02-18 NOTE — Plan of Care (Signed)
Problem: Education: Goal: Knowledge of General Education information will improve Description: Including pain rating scale, medication(s)/side effects and non-pharmacologic comfort measures Outcome: Progressing   Problem: Health Behavior/Discharge Planning: Goal: Ability to manage health-related needs will improve Outcome: Progressing   Problem: Clinical Measurements: Goal: Ability to maintain clinical measurements within normal limits will improve Outcome: Progressing Goal: Will remain free from infection Outcome: Progressing Goal: Diagnostic test results will improve Outcome: Progressing Goal: Respiratory complications will improve Outcome: Progressing Goal: Cardiovascular complication will be avoided Outcome: Progressing   Problem: Activity: Goal: Risk for activity intolerance will decrease Outcome: Progressing   Problem: Nutrition: Goal: Adequate nutrition will be maintained Outcome: Progressing   Problem: Coping: Goal: Level of anxiety will decrease Outcome: Progressing   Problem: Elimination: Goal: Will not experience complications related to bowel motility Outcome: Progressing Goal: Will not experience complications related to urinary retention Outcome: Progressing   Problem: Pain Managment: Goal: General experience of comfort will improve Outcome: Progressing   Problem: Safety: Goal: Ability to remain free from injury will improve Outcome: Progressing   Problem: Skin Integrity: Goal: Risk for impaired skin integrity will decrease Outcome: Progressing   Problem: Education: Goal: Knowledge of risk factors and measures for prevention of condition will improve Outcome: Progressing   Problem: Coping: Goal: Psychosocial and spiritual needs will be supported Outcome: Progressing   Problem: Respiratory: Goal: Will maintain a patent airway Outcome: Progressing Goal: Complications related to the disease process, condition or treatment will be avoided or  minimized Outcome: Progressing   Problem: Education: Goal: Knowledge of risk factors and measures for prevention of condition will improve Outcome: Progressing   Problem: Coping: Goal: Psychosocial and spiritual needs will be supported Outcome: Progressing   Problem: Respiratory: Goal: Will maintain a patent airway Outcome: Progressing Goal: Complications related to the disease process, condition or treatment will be avoided or minimized Outcome: Progressing   Problem: Education: Goal: Ability to describe self-care measures that may prevent or decrease complications (Diabetes Survival Skills Education) will improve Outcome: Progressing Goal: Individualized Educational Video(s) Outcome: Progressing   Problem: Coping: Goal: Ability to adjust to condition or change in health will improve Outcome: Progressing   Problem: Fluid Volume: Goal: Ability to maintain a balanced intake and output will improve Outcome: Progressing   Problem: Health Behavior/Discharge Planning: Goal: Ability to identify and utilize available resources and services will improve Outcome: Progressing Goal: Ability to manage health-related needs will improve Outcome: Progressing   Problem: Metabolic: Goal: Ability to maintain appropriate glucose levels will improve Outcome: Progressing   Problem: Nutritional: Goal: Maintenance of adequate nutrition will improve Outcome: Progressing Goal: Progress toward achieving an optimal weight will improve Outcome: Progressing   Problem: Skin Integrity: Goal: Risk for impaired skin integrity will decrease Outcome: Progressing   Problem: Tissue Perfusion: Goal: Adequacy of tissue perfusion will improve Outcome: Progressing   Problem: Education: Goal: Knowledge of risk factors and measures for prevention of condition will improve Outcome: Progressing   Problem: Coping: Goal: Psychosocial and spiritual needs will be supported Outcome: Progressing    Problem: Respiratory: Goal: Will maintain a patent airway Outcome: Progressing Goal: Complications related to the disease process, condition or treatment will be avoided or minimized Outcome: Progressing   Problem: Education: Goal: Knowledge of disease or condition will improve Outcome: Progressing Goal: Knowledge of secondary prevention will improve Outcome: Progressing Goal: Knowledge of patient specific risk factors addressed and post discharge goals established will improve Outcome: Progressing Goal: Individualized Educational Video(s) Outcome: Progressing   Problem: Coping: Goal: Will verbalize  positive feelings about self Outcome: Progressing Goal: Will identify appropriate support needs Outcome: Progressing   Problem: Health Behavior/Discharge Planning: Goal: Ability to manage health-related needs will improve Outcome: Progressing   Problem: Self-Care: Goal: Ability to participate in self-care as condition permits will improve Outcome: Progressing Goal: Verbalization of feelings and concerns over difficulty with self-care will improve Outcome: Progressing Goal: Ability to communicate needs accurately will improve Outcome: Progressing   Problem: Nutrition: Goal: Risk of aspiration will decrease Outcome: Progressing Goal: Dietary intake will improve Outcome: Progressing   Problem: Intracerebral Hemorrhage Tissue Perfusion: Goal: Complications of Intracerebral Hemorrhage will be minimized Outcome: Progressing   Problem: Ischemic Stroke/TIA Tissue Perfusion: Goal: Complications of ischemic stroke/TIA will be minimized Outcome: Progressing   Problem: Spontaneous Subarachnoid Hemorrhage Tissue Perfusion: Goal: Complications of Spontaneous Subarachnoid Hemorrhage will be minimized Outcome: Progressing

## 2020-02-18 NOTE — Progress Notes (Signed)
NAME:  Alisha Stephenson, MRN:  [id number], DOB:  [birth year], LOS: 21 ADMISSION DATE:  01/20/2020, CONSULTATION DATE:  8/3 REFERRING MD:  Alfredia Ferguson, CHIEF COMPLAINT:  Dyspnea   Brief History   54 y/o female admitted on 8/2 with severe acute respiratory failure with hypoxemia due to COVID 19 pneumonia.  She developed symptoms 1 week prior to admission.  Past Medical History  DM2 Diverticulitis Gallstones Ovarian cyst NAFLD Asthma  Significant Hospital Events   8/2 admit 8/3 ICU transfer, intubated 8/4 prone, paralyze 8/9 significant desaturations today 8/9 VV ECMO cannulation 8/13 ICH 8/30 trach  Consults:  PCCM ECMO team  Procedures:  8/3 ETT > 8/30 8/30 8-0 shiley trach 8/3 PICC >  8/9 LIJ MML 8/9 RIJ [place] 38F  9/8 trialysis 9/10 PEG-J (IR)  Significant Diagnostic Tests:  7/31 CT head > NAICP 7/31 MRI/MRA brain > no acute changes, possibly small aneurysm ACOM 8/13 CT head> multiple areas of ICH 9/9 CXR- significantly worse infiltrates BL  Micro Data:  8/2 blood > NG 8/2 SARS COV 2 > positive 8/4 resp > negative 8/4 urine >  8/12 blood > E. faecalis (pan-sensitive) 8/12 resp: staph aureus> MSSA 8/25 blood>> NG 8/24 BAL> yeast  Antimicrobials:  See fever tab for past abx Current: none  Interim history/subjective:  Tolerating PT, more interactive past 2 days. Denies complaints this morning.  Objective   Blood pressure (!) 123/104, pulse 91, temperature (P) 98.3 F (36.8 C), temperature source (P) Oral, resp. rate (!) 21, height _0  (1.626 m), weight 109.9 kg, SpO2 91 %.    Vent Mode: PCV FiO2 (%):  [40 %] 40 % Set Rate:  [15 bmp] 15 bmp PEEP:  [10 cmH20] 10 cmH20 Plateau Pressure:  [19 cmH20] 19 cmH20   Intake/Output Summary (Last 24 hours) at 02/18/2020 0657 Last data filed at 02/18/2020 0600 Gross per 24 hour  Intake 974.25 ml  Output 1368 ml  Net -393.75 ml   Filed Weights   02/16/20 0452 02/17/20 0500 02/18/20 0500  Weight: 119.8 kg 115.4  kg 109.9 kg    Examination:  GEN: critically ill appearing woman sitting up in bed on ECMO, trached on vent HEENT: [place]/AT, eyes anicteric Neck: trach without bleeding CV: RRR, no murmurs PULM: improving breath sounds, rhales GI: obese, soft, PEG in place without erythema or bleeding EXT: edema continuing to improve, necrotic distal ring finger and thumb on right NEURO: Awake and alert, very interactive, tracking. Still densely paretic LUE, weak but able to lightly lift forearm off bed on the RUE. Wiggling toes. PSYCH: RASS  Cooperative and interactive Derm: No erythema around lines  Pressure Injury 02/14/20 Anus Medial Deep Tissue Pressure Injury - Purple or maroon localized area of discolored intact skin or blood-filled blister due to damage of underlying soft tissue from pressure and/or shear. (Active)  02/14/20 1300  Location: Anus  Location Orientation: Medial  Staging: Deep Tissue Pressure Injury - Purple or maroon localized area of discolored intact skin or blood-filled blister due to damage of underlying soft tissue from pressure and/or shear.  Wound Description (Comments):   Present on Admission: No     ABG paO2 71 BMP stable on CRRT CBC stable Blood culture 9/7 NGTD Fibrinogen 547>>571>>481>>464>  431 >416 LDH 489>>401>>376>>326> 321> 303 PTT 36 Net - 1.6 L  Vent rate 15, pressure control 12, PEEP 10, 40% FiO2 with tidal volumes about 150  CXR personally reviewed: slowly improving lung consolidations, ECMO cannula, trialysis, trach in place.  Resolved  Hospital Problem list   MSSA pneumonia Fungal pneumonia- Candida dublinensis Enterococcal bacteremia-recent blood cultures cleared Concern for possible embolic phenomenon- fingers and possibly CVAs were embolic  Assessment & Plan:    Critically ill due to acute hypoxic and AoC hypercapneic respiratory failure; likely underly OHS.  Requiring VV ECMO support and mechanical ventilation.  ARDS due to COVID 19  pneumonia Tracheostomy in place -Con't VV support, working towards weaning support, eventually sweep trials.  -Con't ultra-lung protective ventilation. No TC trials at this point to make sure we prioritize ECMO weaning. -VAP prevention protocol. -PRN sedation; limiting as much as able to facilitate rehabitation -Routine tracheostomy care -off all antibiotics; monitoring for development of infections  Elevated LDH, hypo-fibrinogenemia and thrombocytopenia--improved since circuit change. -Con't routine ECMO labs  ICH, multifocal. L-sided weakness.  Critical illness myopathy--improved today. -Continue PT, OT, SLP. She is a good candidate for PMV trials on the vent.  Acute metabolic encephalopathy, improving -Minimize sedation as much as possible -Continue melatonin and seroquel to facilitate normal sleep-wake cycles -Continue physical therapy, occupational therapy -Family visitation has been helping; she is very motivated.  Acute renal failure, severe uremia.  Anuric. -Continue CRRT.  Appreciate nephrology's assistance. Goal net negative. -Renally dose meds and avoid nephrotoxic meds -Strict I's/O -bladder scan Q shift; foley removed  Shock related to RV strain - now resolved -Con't midodrine.  Hyperglycemia > not controlled at all with [place] insulin despite aggressive upward titration.  Assume poor absorption, potentially due to subcutaneous edema.   -Improved on IV; when euvolemic may be able to tolerate [place] insulin. Will attempt when back on regular TF dose  Best practice:  Diet: tube feeding Pain/Anxiety/Delirium protocol (if indicated): as above VAP protocol (if indicated): yes DVT prophylaxis: SCDs , heparin fixed dose GI prophylaxis: pantoprazole Glucose control:  insulin infusion  Mobility: bed rest Code Status: limited Family Communication: husband updated by team daily Disposition: ICU  This patient is critically ill with multiple organ system failure which requires  frequent high complexity decision making, assessment, support, evaluation, and titration of therapies. This was completed through the application of advanced monitoring technologies and extensive interpretation of multiple databases. During this encounter critical care time was devoted to patient care services described in this note for 48 minutes.  Julian Hy, MD 02/18/20 6:57 AM [facility]

## 2020-02-19 ENCOUNTER — Inpatient Hospital Stay (HOSPITAL_COMMUNITY): Payer: PRIVATE HEALTH INSURANCE

## 2020-02-19 DIAGNOSIS — Z930 Tracheostomy status: Secondary | ICD-10-CM

## 2020-02-19 DIAGNOSIS — I998 Other disorder of circulatory system: Secondary | ICD-10-CM

## 2020-02-19 LAB — RENAL FUNCTION PANEL
Albumin: 2.4 g/dL — ABNORMAL LOW (ref 3.5–5.0)
Albumin: 2.7 g/dL — ABNORMAL LOW (ref 3.5–5.0)
Anion gap: 8 (ref 5–15)
Anion gap: 10 (ref 5–15)
BUN: 15 mg/dL (ref 6–20)
BUN: 18 mg/dL (ref 6–20)
CO2: 26 mmol/L (ref 22–32)
CO2: 25 mmol/L (ref 22–32)
Calcium: 8.2 mg/dL — ABNORMAL LOW (ref 8.9–10.3)
Calcium: 8.1 mg/dL — ABNORMAL LOW (ref 8.9–10.3)
Chloride: 103 mmol/L (ref 98–111)
Chloride: 99 mmol/L (ref 98–111)
Creatinine, Ser: 0.93 mg/dL (ref 0.44–1.00)
Creatinine, Ser: 0.86 mg/dL (ref 0.44–1.00)
GFR calc Af Amer: >60 mL/min (ref >60)
GFR calc Af Amer: >60 mL/min (ref >60)
GFR calc non Af Amer: >60 mL/min (ref >60)
GFR calc non Af Amer: >60 mL/min (ref >60)
Glucose, Bld: 176 mg/dL — ABNORMAL HIGH (ref 70–99)
Glucose, Bld: 188 mg/dL — ABNORMAL HIGH (ref 70–99)
Phosphorus: 1.3 mg/dL — ABNORMAL LOW (ref 2.5–4.6)
Phosphorus: 3.5 mg/dL (ref 2.5–4.6)
Potassium: 3.7 mmol/L (ref 3.5–5.1)
Potassium: 4.1 mmol/L (ref 3.5–5.1)
Sodium: 137 mmol/L (ref 135–145)
Sodium: 134 mmol/L — ABNORMAL LOW (ref 135–145)

## 2020-02-19 LAB — POCT I-STAT 7, (LYTES, BLD GAS, ICA,H+H)
Acid-Base Excess: 0 mmol/L (ref 0.0–2.0)
Acid-Base Excess: 1 mmol/L (ref 0.0–2.0)
Acid-Base Excess: 1 mmol/L (ref 0.0–2.0)
Acid-Base Excess: 2 mmol/L (ref 0.0–2.0)
Acid-Base Excess: 2 mmol/L (ref 0.0–2.0)
Acid-Base Excess: 2 mmol/L (ref 0.0–2.0)
Acid-Base Excess: 2 mmol/L (ref 0.0–2.0)
Bicarbonate: 26.5 mmol/L (ref 20.0–28.0)
Bicarbonate: 26.8 mmol/L (ref 20.0–28.0)
Bicarbonate: 27.4 mmol/L (ref 20.0–28.0)
Bicarbonate: 27.6 mmol/L (ref 20.0–28.0)
Bicarbonate: 27.9 mmol/L (ref 20.0–28.0)
Bicarbonate: 28.1 mmol/L — ABNORMAL HIGH (ref 20.0–28.0)
Bicarbonate: 29 mmol/L — ABNORMAL HIGH (ref 20.0–28.0)
Calcium, Ion: 1.11 mmol/L — ABNORMAL LOW (ref 1.15–1.40)
Calcium, Ion: 1.14 mmol/L — ABNORMAL LOW (ref 1.15–1.40)
Calcium, Ion: 1.15 mmol/L (ref 1.15–1.40)
Calcium, Ion: 1.16 mmol/L (ref 1.15–1.40)
Calcium, Ion: 1.18 mmol/L (ref 1.15–1.40)
Calcium, Ion: 1.19 mmol/L (ref 1.15–1.40)
Calcium, Ion: 1.19 mmol/L (ref 1.15–1.40)
HCT: 29 % — ABNORMAL LOW (ref 36.0–46.0)
HCT: 29 % — ABNORMAL LOW (ref 36.0–46.0)
HCT: 30 % — ABNORMAL LOW (ref 36.0–46.0)
HCT: 31 % — ABNORMAL LOW (ref 36.0–46.0)
HCT: 31 % — ABNORMAL LOW (ref 36.0–46.0)
HCT: 31 % — ABNORMAL LOW (ref 36.0–46.0)
HCT: 31 % — ABNORMAL LOW (ref 36.0–46.0)
Hemoglobin: 10.2 g/dL — ABNORMAL LOW (ref 12.0–15.0)
Hemoglobin: 10.5 g/dL — ABNORMAL LOW (ref 12.0–15.0)
Hemoglobin: 10.5 g/dL — ABNORMAL LOW (ref 12.0–15.0)
Hemoglobin: 10.5 g/dL — ABNORMAL LOW (ref 12.0–15.0)
Hemoglobin: 10.5 g/dL — ABNORMAL LOW (ref 12.0–15.0)
Hemoglobin: 9.9 g/dL — ABNORMAL LOW (ref 12.0–15.0)
Hemoglobin: 9.9 g/dL — ABNORMAL LOW (ref 12.0–15.0)
O2 Saturation: 100 %
O2 Saturation: 85 %
O2 Saturation: 87 %
O2 Saturation: 89 %
O2 Saturation: 89 %
O2 Saturation: 90 %
O2 Saturation: 92 %
Patient temperature: 36.5
Patient temperature: 97.3
Patient temperature: 97.4
Patient temperature: 97.4
Patient temperature: 97.5
Patient temperature: 98
Patient temperature: 98.2
Potassium: 3.7 mmol/L (ref 3.5–5.1)
Potassium: 3.8 mmol/L (ref 3.5–5.1)
Potassium: 3.9 mmol/L (ref 3.5–5.1)
Potassium: 4 mmol/L (ref 3.5–5.1)
Potassium: 4 mmol/L (ref 3.5–5.1)
Potassium: 4.2 mmol/L (ref 3.5–5.1)
Potassium: 4.2 mmol/L (ref 3.5–5.1)
Sodium: 137 mmol/L (ref 135–145)
Sodium: 137 mmol/L (ref 135–145)
Sodium: 138 mmol/L (ref 135–145)
Sodium: 138 mmol/L (ref 135–145)
Sodium: 138 mmol/L (ref 135–145)
Sodium: 138 mmol/L (ref 135–145)
Sodium: 138 mmol/L (ref 135–145)
TCO2: 28 mmol/L (ref 22–32)
TCO2: 28 mmol/L (ref 22–32)
TCO2: 29 mmol/L (ref 22–32)
TCO2: 29 mmol/L (ref 22–32)
TCO2: 29 mmol/L (ref 22–32)
TCO2: 30 mmol/L (ref 22–32)
TCO2: 31 mmol/L (ref 22–32)
pCO2 arterial: 43.3 mmHg (ref 32.0–48.0)
pCO2 arterial: 46.7 mmHg (ref 32.0–48.0)
pCO2 arterial: 48.5 mmHg — ABNORMAL HIGH (ref 32.0–48.0)
pCO2 arterial: 48.6 mmHg — ABNORMAL HIGH (ref 32.0–48.0)
pCO2 arterial: 49.6 mmHg — ABNORMAL HIGH (ref 32.0–48.0)
pCO2 arterial: 51.3 mmHg — ABNORMAL HIGH (ref 32.0–48.0)
pCO2 arterial: 54.1 mmHg — ABNORMAL HIGH (ref 32.0–48.0)
pH, Arterial: 7.319 — ABNORMAL LOW (ref 7.350–7.450)
pH, Arterial: 7.335 — ABNORMAL LOW (ref 7.350–7.450)
pH, Arterial: 7.347 — ABNORMAL LOW (ref 7.350–7.450)
pH, Arterial: 7.349 — ABNORMAL LOW (ref 7.350–7.450)
pH, Arterial: 7.368 (ref 7.350–7.450)
pH, Arterial: 7.382 (ref 7.350–7.450)
pH, Arterial: 7.409 (ref 7.350–7.450)
pO2, Arterial: 217 mmHg — ABNORMAL HIGH (ref 83.0–108.0)
pO2, Arterial: 51 mmHg — ABNORMAL LOW (ref 83.0–108.0)
pO2, Arterial: 55 mmHg — ABNORMAL LOW (ref 83.0–108.0)
pO2, Arterial: 59 mmHg — ABNORMAL LOW (ref 83.0–108.0)
pO2, Arterial: 60 mmHg — ABNORMAL LOW (ref 83.0–108.0)
pO2, Arterial: 61 mmHg — ABNORMAL LOW (ref 83.0–108.0)
pO2, Arterial: 64 mmHg — ABNORMAL LOW (ref 83.0–108.0)

## 2020-02-19 LAB — LACTIC ACID, PLASMA
Lactic Acid, Venous: 1.2 mmol/L (ref 0.5–1.9)
Lactic Acid, Venous: 1.5 mmol/L (ref 0.5–1.9)

## 2020-02-19 LAB — CBC
HCT: 27.8 % — ABNORMAL LOW (ref 36.0–46.0)
HCT: 30.9 % — ABNORMAL LOW (ref 36.0–46.0)
Hemoglobin: 8.7 g/dL — ABNORMAL LOW (ref 12.0–15.0)
Hemoglobin: 9.2 g/dL — ABNORMAL LOW (ref 12.0–15.0)
MCH: 33.7 pg (ref 26.0–34.0)
MCH: 32.2 pg (ref 26.0–34.0)
MCHC: 31.3 g/dL (ref 30.0–36.0)
MCHC: 29.8 g/dL — ABNORMAL LOW (ref 30.0–36.0)
MCV: 107.8 fL — ABNORMAL HIGH (ref 80.0–100.0)
MCV: 108 fL — ABNORMAL HIGH (ref 80.0–100.0)
Platelets: 117 10*3/uL — ABNORMAL LOW (ref 150–400)
Platelets: 126 10*3/uL — ABNORMAL LOW (ref 150–400)
RBC: 2.58 MIL/uL — ABNORMAL LOW (ref 3.87–5.11)
RBC: 2.86 MIL/uL — ABNORMAL LOW (ref 3.87–5.11)
RDW: 25 % — ABNORMAL HIGH (ref 11.5–15.5)
RDW: 24.8 % — ABNORMAL HIGH (ref 11.5–15.5)
WBC: 6.9 10*3/uL (ref 4.0–10.5)
WBC: 9.8 10*3/uL (ref 4.0–10.5)
nRBC: 0.4 % — ABNORMAL HIGH (ref 0.0–0.2)
nRBC: 0.7 % — ABNORMAL HIGH (ref 0.0–0.2)

## 2020-02-19 LAB — LACTATE DEHYDROGENASE: LDH: 315 U/L — ABNORMAL HIGH (ref 98–192)

## 2020-02-19 LAB — APTT
aPTT: 68 seconds — ABNORMAL HIGH (ref 24–36)
aPTT: 60 seconds — ABNORMAL HIGH (ref 24–36)

## 2020-02-19 LAB — GLUCOSE, CAPILLARY
Glucose-Capillary: 143 mg/dL — ABNORMAL HIGH (ref 70–99)
Glucose-Capillary: 156 mg/dL — ABNORMAL HIGH (ref 70–99)
Glucose-Capillary: 157 mg/dL — ABNORMAL HIGH (ref 70–99)
Glucose-Capillary: 165 mg/dL — ABNORMAL HIGH (ref 70–99)
Glucose-Capillary: 172 mg/dL — ABNORMAL HIGH (ref 70–99)
Glucose-Capillary: 175 mg/dL — ABNORMAL HIGH (ref 70–99)
Glucose-Capillary: 175 mg/dL — ABNORMAL HIGH (ref 70–99)
Glucose-Capillary: 177 mg/dL — ABNORMAL HIGH (ref 70–99)
Glucose-Capillary: 180 mg/dL — ABNORMAL HIGH (ref 70–99)
Glucose-Capillary: 185 mg/dL — ABNORMAL HIGH (ref 70–99)
Glucose-Capillary: 188 mg/dL — ABNORMAL HIGH (ref 70–99)
Glucose-Capillary: 191 mg/dL — ABNORMAL HIGH (ref 70–99)
Glucose-Capillary: 191 mg/dL — ABNORMAL HIGH (ref 70–99)
Glucose-Capillary: 193 mg/dL — ABNORMAL HIGH (ref 70–99)
Glucose-Capillary: 194 mg/dL — ABNORMAL HIGH (ref 70–99)
Glucose-Capillary: 207 mg/dL — ABNORMAL HIGH (ref 70–99)
Glucose-Capillary: 213 mg/dL — ABNORMAL HIGH (ref 70–99)
Glucose-Capillary: 218 mg/dL — ABNORMAL HIGH (ref 70–99)
Glucose-Capillary: 219 mg/dL — ABNORMAL HIGH (ref 70–99)
Glucose-Capillary: 228 mg/dL — ABNORMAL HIGH (ref 70–99)

## 2020-02-19 LAB — BASIC METABOLIC PANEL
Anion gap: 9 (ref 5–15)
BUN: 18 mg/dL (ref 6–20)
CO2: 26 mmol/L (ref 22–32)
Calcium: 8.1 mg/dL — ABNORMAL LOW (ref 8.9–10.3)
Chloride: 101 mmol/L (ref 98–111)
Creatinine, Ser: 0.91 mg/dL (ref 0.44–1.00)
GFR calc Af Amer: >60 mL/min (ref >60)
GFR calc non Af Amer: >60 mL/min (ref >60)
Glucose, Bld: 187 mg/dL — ABNORMAL HIGH (ref 70–99)
Potassium: 4.1 mmol/L (ref 3.5–5.1)
Sodium: 136 mmol/L (ref 135–145)

## 2020-02-19 LAB — MAGNESIUM: Magnesium: 2.5 mg/dL — ABNORMAL HIGH (ref 1.7–2.4)

## 2020-02-19 LAB — FIBRINOGEN: Fibrinogen: 434 mg/dL (ref 210–475)

## 2020-02-19 LAB — HEPARIN LEVEL (UNFRACTIONATED)
Heparin Unfractionated: 0.13 IU/mL — ABNORMAL LOW (ref 0.30–0.70)
Heparin Unfractionated: 0.13 IU/mL — ABNORMAL LOW (ref 0.30–0.70)

## 2020-02-19 MED ORDER — COLLAGENASE 250 UNIT/GM EX OINT
TOPICAL_OINTMENT | Freq: Every day | CUTANEOUS | Status: DC
  Administered 2020-02-27 – 2020-02-29 (×3): 1 via TOPICAL
  Filled 2020-02-19 (×3): qty 30

## 2020-02-19 MED ORDER — SODIUM PHOSPHATES 45 MMOLE/15ML IV SOLN
30.0000 mmol | Freq: Once | INTRAVENOUS | Status: AC
  Administered 2020-02-19: 30 mmol via INTRAVENOUS
  Filled 2020-02-19: qty 10

## 2020-02-19 NOTE — Progress Notes (Signed)
SLP Cancellation Note  Patient Details Name: Alisha Stephenson MRN: [id number] DOB: 02-28-1966   Cancelled treatment:       Reason Eval/Treat Not Completed: Other (comment)> Discussed pt at length with MD and RT today. She has been determined to be appropriate for inline PMSV trials. Will attempt to schedule with RT first thing in am tomorrow.    Miking Usrey, Katherene Ponto 02/19/2020, 12:29 PM

## 2020-02-19 NOTE — Progress Notes (Addendum)
Patient ID: Alisha Stephenson, female   DOB: [birth year], 54 y.o.   MRN: [id number]  ADVANCED HF ROUNDING NOTE   Subjective:    Remains awake on vent via trach. Following commands and able to move more, working with PT/OT.  Remains on CVVHD. Pulling 25-50. Anuric.  Heparin added to CVVHD circuit last night for clotting. Sweep gas up from 8 -> 10 last night.   CXR diffuse infiltrates. Mold improvement.   ECMO  Flow4.8 L RPM 4100 Sweep10  Labs: 7.41/43/64/92% Hgb8.7 PLT 117k LDH 315 Lactic acid1.2   Objective:   Weight Range:  Vital Signs:                 Temp:  [97.8 F (36.6 C)-98.2 F (36.8 C)] 97.8 F (36.6 C) (09/12 2000) Pulse Rate:  [91-100] 100 (09/13 0600) SpO2:  [79 %-98 %] 87 % (09/13 0600) FiO2 (%):  [40 %] 40 % (09/13 0405) Weight:  [108.6 kg] 108.6 kg (09/13 0500) Last BM Date: 02/18/20  Weight change:      Filed Weights   02/17/20 0500 02/18/20 0500 02/19/20 0500  Weight: 115.4 kg 109.9 kg 108.6 kg    Intake/Output:             Intake/Output Summary (Last 24 hours) at 02/19/2020 0754 Last data filed at 02/19/2020 0700    Gross per 24 hour  Intake 2132.57 ml  Output 2614 ml  Net -481.43 ml                Physical Exam: General:  Awake on vent HEENT: normal Neck: + ECMO IJ cath. LIJ trialysis.Carotids 2+ bilat; no bruits. No lymphadenopathy or thryomegaly appreciated. Cor: PMI nondisplaced. Regular rate & rhythm. No rubs, gallops or murmurs. Lungs: coarse Abdomen: soft, nontender, nondistended. No hepatosplenomegaly. No bruits or masses. Good bowel sounds. Extremities: no cyanosis, clubbing, rash, tr edema Neuro: awake following commands. Can move R arm off the bed. And move feet. Unable to move L arm. R first 2 fingers demarcated   Telemetry: Sinus 90-100s Personally reviewed   Labs: Basic Metabolic Panel: Last Labs                        Recent Labs  Lab 02/13/20 0314 02/13/20 0315 02/16/20 0421 02/16/20 0436  02/16/20 1744 02/17/20 0218 02/17/20 0219 02/17/20 0440 02/17/20 1603 02/17/20 1603 02/17/20 1723 02/17/20 1725 02/18/20 0400 02/18/20 0416 02/18/20 1641 02/18/20 1809 02/18/20 2024 02/19/20 0335 02/19/20 0350  NA 137   < > 137   < >   < >  --  136   < > 136   < > 135   < > 137   < > 136 137 137 137 138  K 3.8   < > 3.7   < >   < >  --  3.5   < > 4.3   < > 4.0   < > 3.7   < > 4.2 4.2 4.6 3.7 3.7  CL 100   < > 103   < >   < >  --  103   < > 101  --  100  --  103  --  101  --   --  103  --   CO2 23   < > 23   < >   < >  --  22   < > 23  --  23  --  25  --  25  --   --  26  --   GLUCOSE 194*   < > 147*   < >   < >  --  97   < > 153*  --  145*  --  167*  --  208*  --   --  176*  --   BUN 28*   < > 35*   < >   < >  --  19   < > 17  --  16  --  14  --  14  --   --  15  --   CREATININE 1.32*   < > 1.75*   < >   < >  --  1.29*   < > 1.21*  --  1.29*  --  1.12*  --  1.13*  --   --  0.93  --   CALCIUM 8.6*   < > 8.3*   < >   < >  --  8.0*   < > 8.5*   < > 8.7*   < > 8.2*  --  8.6*  --   --  8.2*  --   MG 2.9*  --  2.6*  --   --  2.5*  --   --   --   --   --   --  2.6*  --   --   --   --  2.5*  --   PHOS 3.7   < > 2.7   < >   < >  --  2.1*  --  2.9  --   --   --  2.2*  --  2.5  --   --  1.3*  --    < > = values in this interval not displayed.      Liver Function Tests: Last Labs          Recent Labs  Lab 02/17/20 0219 02/17/20 1603 02/18/20 0400 02/18/20 1641 02/19/20 0335  ALBUMIN 2.2* 2.9* 2.5* 2.8* 2.4*     Last Labs   No results for input(s): LIPASE, AMYLASE in the last 168 hours.   Last Labs   No results for input(s): AMMONIA in the last 168 hours.    CBC: Last Labs                Recent Labs  Lab 02/17/20 1723 02/17/20 1725 02/18/20 0400 02/18/20 0416 02/18/20 1255 02/18/20 1639 02/18/20 1641 02/18/20 1809 02/18/20 2024 02/19/20 0335 02/19/20 0350  WBC 13.6*  --  7.2  --  9.0  --  10.9*  --   --  6.9  --   HGB 8.5*   < > 7.5*   < > 9.2*   < > 9.5*  10.2* 9.9* 8.7* 9.9*  HCT 28.5*   < > 25.2*   < > 30.5*   < > 31.7* 30.0* 29.0* 27.8* 29.0*  MCV 110.9*  --  111.5*  --  107.0*  --  107.5*  --   --  107.8*  --   PLT 130*  --  111*  --  119*  --  123*  --   --  117*  --    < > = values in this interval not displayed.      Cardiac Enzymes: Last Labs   No results for input(s): CKTOTAL, CKMB, CKMBINDEX, TROPONINI in the last 168 hours.    BNP: BNP (last 3 results) Recent Labs (within last 365 days)  No results for input(s): BNP in  the last 8760 hours.    ProBNP (last 3 results) Recent Labs (within last 365 days)  No results for input(s): PROBNP in the last 8760 hours.      Other results:  Imaging: Imaging Results (Last 48 hours)  DG CHEST PORT 1 VIEW  Result Date: 02/18/2020 CLINICAL DATA:  Cardio respiratory failure.  ECMO catheter present. EXAM: PORTABLE CHEST 1 VIEW COMPARISON:  February 17, 2020 FINDINGS: Tracheostomy catheter tip is 5.6 cm above the carina. ECMO catheter seen on the right with tip below the diaphragm. Central catheter tip from the right side in the superior vena cava. Left-sided central catheter tip is in the superior vena cava. No pneumothorax. There is diffuse interstitial and airspace opacity bilaterally, somewhat more on the right than on the left. Apparent superimposed pleural effusions noted. Heart upper normal in size with pulmonary vascularity grossly normal. No adenopathy evident. Postoperative change lower cervical spine. IMPRESSION: Tube and catheter positions as described without appreciable pneumothorax. Persistent pleural effusions with multifocal interstitial and alveolar opacity, similar to 1 day prior. Stable cardiac silhouette. Electronically Signed   By: Lowella Grip III M.D.   On: 02/18/2020 09:05       Medications:     Scheduled Medications:  .  amiodarone   200 mg  Per Tube  Daily   .  vitamin C   500 mg  Per Tube  Daily   .  chlorhexidine gluconate  (MEDLINE KIT)   15 mL  Mouth Rinse  BID   .  Chlorhexidine Gluconate Cloth   6 each  Topical  Q0600   .  clonazePAM   1 mg  Per Tube  Q6H   .  clotrimazole   1 Applicatorful  Vaginal  QHS   .  docusate   100 mg  Per Tube  BID   .  feeding supplement (PROSource TF)   45 mL  Per Tube  QID   .  Gerhardt's butt cream     Topical  BID   .  linagliptin   5 mg  Per Tube  Daily   .  lipase/protease/amylase)   20,880 Units  Per Tube  Once     And   .  sodium bicarbonate   650 mg  Per Tube  Once   .  mouth rinse   15 mL  Mouth Rinse  10 times per day   .  melatonin   9 mg  Per Tube  QHS   .  midodrine   10 mg  Per Tube  TID   .  multivitamin   1 tablet  Per Tube  QHS   .  oxyCODONE   10 mg  Per Tube  Q6H   .  pantoprazole (PROTONIX) IV   40 mg  Intravenous  BID   .  polyethylene glycol   17 g  Per Tube  Daily   .  QUEtiapine   50 mg  Per Tube  QHS   .  sennosides   5 mL  Per Tube  BID   .  zinc sulfate   220 mg  Per Tube  Daily     Infusions:  .   prismasol BGK 4/2.5  400 mL/hr at 02/19/20 0108   .   prismasol BGK 4/2.5  200 mL/hr at 02/19/20 0107   .  sodium chloride  500 mL (02/11/20 2215)   .  sodium chloride  10 mL/hr at 02/19/20 0700   .  sodium  chloride      .  sodium chloride      .  albumin human  12.5 g (02/17/20 1126)   .  albumin human  Stopped (02/06/20 2000)   .  feeding supplement (PIVOT 1.5 CAL)  65 mL/hr at 02/19/20 0700   .  heparin 10,000 units/ 20 mL infusion syringe  500 Units/hr (02/19/20 0000)   .  heparin  700 Units/hr (02/19/20 0700)   .  insulin  2.4 mL/hr at 02/19/20 0700   .  norepinephrine (LEVOPHED) Adult infusion  Stopped (02/17/20 1948)   .  phenylephrine (NEO-SYNEPHRINE) Adult infusion  Stopped (02/13/20 0155)   .  prismasol BGK 4/2.5  2,000 mL/hr at 02/19/20 0605   .  sodium phosphate  Dextrose 5% IVPB         PRN Medications:  sodium chloride, sodium chloride, sodium chloride, sodium chloride, acetaminophen (TYLENOL) oral liquid 160 mg/5 mL, albumin human, albuterol, alteplase, alum & mag hydroxide-simeth, dextrose, fentaNYL (SUBLIMAZE) injection, guaiFENesin-dextromethorphan, heparin, heparin, heparin, hydrALAZINE, HYDROmorphone (DILAUDID) injection, hydrOXYzine, lidocaine (PF), lidocaine-prilocaine, ondansetron **OR** ondansetron (ZOFRAN) IV, pentafluoroprop-tetrafluoroeth   Assessment/Plan:   1. Acute hypoxic/hypercapneic respiratory failure in setting of severe COVID PNA/ARDS -> VV ECMO - admit 8/2 - intubation 8/3 - has received actmera (compelted 8/2), remdesivir (completed 8/6) and steroids - Cannulated for VV ECMO on 8/9 - Extubated 8/16. Reintubated 8/24 - s/p trach 8/30. - Circuit changed 8/24 due to concern for hemolysis and worsening oxygenation. - CVVHD started 8/22 for volume removal and uremia. Remains anuric. CXR worse on 9/9 so switched back to CVVHD.  WEight way down. Chugging on ECMO. Will aim to keep even.  - Remains on low-dose heparin. No bleeding. Will change PTT goal to 40-60, increase heparin gtt today.  - Off IV sedation. Continue melatonin and seroquel for sleep at night  - Off pressors and on midodrine 10 tid - CXR slowly improving. Will try to wean sweep today - Main issues remain to be poor lung recovery, critical care myopathy and AKI. Continue slow wean as tolerated. Need to prioritize ECMO wean then vent wean.  - Continue heparin with level 0.2-0.3 Discussed dosing with PharmD personally.  2. Enterococcus sepsis - Finished high-dose zosyn on 9/7 per ID - Eraxis added on 8/26 with yeast in BAL 8/24 (ended 9/2)  - Cultures repeated with increasing pressor demands - F/u cx 9/7 Remain NGTD.   3. Intracranial hemorrhage - ? Septic emboli - repeat head CT on 8/14 with stable bleeds but increased edema - neurology has seen. Suspect significant  long-term injury sustained. Will follow commands. Appears to have dense LUE weakness and possibly LLE - repeat head CT stable 8/16 - tolerating low-dose heparin, PTT goal 40-60 as above.   4. Thrombocytopenia - PLTs 110-120k   - Continue to follow. No change  5. Morbid obesity -Body mass index is Body mass index is 41.1 kg/m.  6. Poorly controlled DM2 - HgBA1c 10.7 - On insulin gtt  7. PAF - intermittent episodes. Last 8/26 - Now on po. Quiescent  8. AKI/azotemia - CVVHD started on 8/22 - Down 70 pounds.  - transitioned to [facility] on 9/7 - switched back to CVVHD for better volume removal on 9/9. - new catheter placed 9/9 - will eventually need tunneled access - Keep even to -25.   9. Emesis - Cor-trak out.  - Has GJ tube now, slowly increase TFs.   10. Sacral decub  - Wound care following  11. Necrotic fingers -  Will need vascular consult.   12. Anemia - Transfuse hgb < 8 - 8.7 today  CRITICAL CARE Performed by: Glori Bickers  Total critical care time: 35 minutes  Critical care time was exclusive of separately billable procedures and treating other patients.  Critical care was necessary to treat or prevent imminent or life-threatening deterioration.  Critical care was time spent personally by me (independent of midlevel providers or residents) on the following activities: development of treatment plan with patient and/or surrogate as well as nursing, discussions with consultants, evaluation of patient's response to treatment, examination of patient, obtaining history from patient or surrogate, ordering and performing treatments and interventions, ordering and review of laboratory studies, ordering and review of radiographic studies, pulse oximetry and re-evaluation of patient's condition.   Length of Stay: 76   Glori Bickers  MD 02/19/2020, 7:54 AM  Advanced Heart Failure Team Pager [phone] (M-F; [place])  Please contact [facility] for night-coverage after hours (4p -7a ) and weekends on [email or web address]

## 2020-02-19 NOTE — Consult Note (Signed)
  Progress Note    02/19/2020 12:54 PM Reason for consult: Ischemic right fingers Consulting physician: Dr. Haroldine Laws   We were called back to evaluate patient's right index finger and thumb which were evaluated 2 weeks ago by Dr. Oneida Alar.  Subjective: Patient was sleeping and minimally interactive on my evaluation.  Per the family at bedside and the bedside nurse she has had progressive ischemic changes of her index finger and thumb, she is able to move the right hand without any limitation.  She also has dark discoloration of her right great toe which is limited to the nail bed.  Her left hand has also been profoundly dusky for the past several weeks.  Her left radial A-line was just removed and placed in the right radial artery.  She is not currently on any pressors.  Vitals:   02/19/20 1136 02/19/20 1200  BP: (!) 113/43 (!) 105/46  Pulse: 84 88  Resp: 16 18  Temp:    SpO2: 95% 91%    Physical Exam: Patient responds is able to squeeze my hand on the right limited left upper extremity mobility Right index finger and thumb have ischemic changes distally which are dry and gangrenous Left hand does have purple discoloration Very strong palmar arch signals bilaterally.  Very strong ulnar artery signals bilaterally Palpable dorsalis pedis pulses bilaterally There is dark discoloration of her right great toe nailbed  CBC    Component Value Date/Time   WBC 6.9 02/19/2020 0335   RBC 2.58 (L) 02/19/2020 0335   HGB 10.5 (L) 02/19/2020 1103   HCT 31.0 (L) 02/19/2020 1103   PLT 117 (L) 02/19/2020 0335   MCV 107.8 (H) 02/19/2020 0335   MCH 33.7 02/19/2020 0335   MCHC 31.3 02/19/2020 0335   RDW 25.0 (H) 02/19/2020 0335   LYMPHSABS 1.7 02/07/2020 0311   MONOABS 0.7 02/07/2020 0311   EOSABS 0.8 (H) 02/07/2020 0311   BASOSABS 0.1 02/07/2020 0311    BMET    Component Value Date/Time   NA 138 02/19/2020 1103   K 3.9 02/19/2020 1103   CL 103 02/19/2020 0335   CO2 26 02/19/2020  0335   GLUCOSE 176 (H) 02/19/2020 0335   BUN 15 02/19/2020 0335   CREATININE 0.93 02/19/2020 0335   CALCIUM 8.2 (L) 02/19/2020 0335   GFRNONAA >60 02/19/2020 0335   GFRAA >60 02/19/2020 0335    INR    Component Value Date/Time   INR 1.1 02/05/2020 2242     Intake/Output Summary (Last 24 hours) at 02/19/2020 1254 Last data filed at 02/19/2020 1200 Gross per 24 hour  Intake 2099.03 ml  Output 2419 ml  Net -319.97 ml     Assessment/plan:  54 y.o. female is here with respiratory failure secondary to Covid, on CVVHD.  Currently improving and heparin drip has been started.  She is not on any pressors.  She appears to have demarcated her right index finger and thumb with dry gangrene to the distal parts of both digits.  She has good blood flow in all 4 extremities.  I reviewed her previous duplex of the right upper extremity she does not need any further vascular evaluation.  Will likely require amputation of partial thumb and partial index finger when she has fully recovered, this will be at the discretion of hand surgery.  Vascular surgery will be available as needed.  Levelle Edelen C. Donzetta Matters, MD [facility] Office: [phone] Pager: [phone]  02/19/2020 12:54 PM

## 2020-02-19 NOTE — Procedures (Signed)
Extracorporeal support note   ECLS support day: 36 (cannulated 02/02/2020) Indication: Covid 19 pneumonia/ARDS  Configuration: VV  Drainage cannula: right IJ [place]  Return cannula: same  Pump speed: 4100 Pump flow: 4.8 Pump used: Centrimag  Oxygenator: Quadrox O2 blender: 100% Sweep gas: 10  ABG    Component Value Date/Time   PHART 7.409 02/19/2020 0350   PCO2ART 43.3 02/19/2020 0350   PO2ART 64 (L) 02/19/2020 0350   HCO3 27.4 02/19/2020 0350   TCO2 29 02/19/2020 0350   ACIDBASEDEF 1.0 02/15/2020 1947   O2SAT 92.0 02/19/2020 0350    Circuit check: minimal visible clot- only fibrin in corners  Anticoagulant: Continue heparin Anticoagulation targets: 40-60  Changes in support: start PMV trials when able; con't MV and working towards sweep trials.  Anticipated goals/duration of support: bridge to recovery  Julian Hy, MD 02/19/20 8:56 PM [facility]

## 2020-02-19 NOTE — Progress Notes (Signed)
[facility] Progress Note    Assessment/ Plan:    1F severe MSOF from COVID-19 pneumonia requiring VV ECMO.  1.  Anuric AKI due to shock, sepsis causing acute tubular necrosis:CRRT on 8/22- 02/12/20. IHD 02/13/20.  Planned for 9/9 but back to CRRT d/t fluid overload.  L IJ nontunneled catheter changed 9/9 from L [place].   - Continue CRRT.  On All 4K.  Reduced dialysate to 1.5 L/hr from 2.  Note renal added flat rate heparin 500 u/ hr to circuit 02/18/20 d/t clotting x 2.   - UF goal net neg 50 mL/ hr as tolerated   2.Shock due to sepsis, Covid infection, blood cultures positive for Enterococcus bacteremia: s/p Zosyn (ended 9/7), cultures repeated 9/7, NGTD.  TTE 9/7 no AI, done for concern of wide pulse pressure  3.Acute hypoxic and hypercapnic respiratory failure, ARDS due to COVID-19 pneumonia: on VV ECMO for refractory hypoxemia.  3.Hypernatremia, hypervolemia: relative free water deficit. Improved with CRRT.  4.Acute toxic metabolic encephalopathy:Multifactorial, improving  5.Anemia, thrombocytopenia: With hemolysis likely and acute illness contributing. Transfusions per primary team.   6.Intracranial hemorrhage: With left-sided weakness, ? Septic emboli  7.  Diabetes per primary  8.  Hypophosphatemia repleting - sodium phos 30 mmol ordered 9/13 AM     9.  Critical illness myopathy: PT as able per primary team    Subjective:    Has been net neg 25 an hour most recently per nursing.  No urine output charted.  Had 2.1 L UF with CRRT over 9/12.  Continues on ECMO.  Review of systems:  Denies shortness of breath or chest pain  Denies n/v    Objective:   BP (!) 123/104 (BP Location: Left Arm)   Pulse 100   Temp 97.8 F (36.6 C) (Oral)   Resp (!) 21   Ht 5' 4"  (1.626 m)   Wt 108.6 kg   SpO2 (!) 87%   BMI 41.10 kg/m   Intake/Output Summary (Last 24 hours) at 02/19/2020 0653 Last data filed at 02/19/2020 0600 Gross per 24 hour  Intake 2124.32 ml   Output 2568 ml  Net -443.68 ml   Weight change: -1.3 kg  Physical Exam: Gen: critically ill, lying in bed in NAD NECK: R IJ ECMO access CVS: RRR Resp: reduced but clearer to auscultation  Abd: obese Ext: 1+ edema bilaterally ACCESS: L IJ nontunneled HD catheter MSK: necrotic thumb and 1st finger R hand Psych no anxiety or agitation Neuro - alert and oriented - provides hx and follows commands  Imaging: DG CHEST PORT 1 VIEW  Result Date: 02/18/2020 CLINICAL DATA:  Cardio respiratory failure.  ECMO catheter present. EXAM: PORTABLE CHEST 1 VIEW COMPARISON:  February 17, 2020 FINDINGS: Tracheostomy catheter tip is 5.6 cm above the carina. ECMO catheter seen on the right with tip below the diaphragm. Central catheter tip from the right side in the superior vena cava. Left-sided central catheter tip is in the superior vena cava. No pneumothorax. There is diffuse interstitial and airspace opacity bilaterally, somewhat more on the right than on the left. Apparent superimposed pleural effusions noted. Heart upper normal in size with pulmonary vascularity grossly normal. No adenopathy evident. Postoperative change lower cervical spine. IMPRESSION: Tube and catheter positions as described without appreciable pneumothorax. Persistent pleural effusions with multifocal interstitial and alveolar opacity, similar to 1 day prior. Stable cardiac silhouette. Electronically Signed   By: Lowella Grip III M.D.   On: 02/18/2020 09:05    Labs: BMET Recent  Labs  Lab 02/16/20 0421 02/16/20 0436 02/16/20 1744 02/16/20 1951 02/17/20 0219 02/17/20 0440 02/17/20 1603 02/17/20 1603 02/17/20 1723 02/17/20 1725 02/18/20 0400 02/18/20 0416 02/18/20 0927 02/18/20 1639 02/18/20 1641 02/18/20 1809 02/18/20 2024 02/19/20 0335 02/19/20 0350  NA 137   < > 140   < > 136   < > 136   < > 135   < > 137   < > 138 138 136 137 137 137 138  K 3.7   < > 4.0   < > 3.5   < > 4.3   < > 4.0   < > 3.7   < > 4.0 4.5  4.2 4.2 4.6 3.7 3.7  CL 103   < > 106  --  103  --  101  --  100  --  103  --   --   --  101  --   --  103  --   CO2 23   < > 23  --  22  --  23  --  23  --  25  --   --   --  25  --   --  26  --   GLUCOSE 147*   < > 130*  --  97  --  153*  --  145*  --  167*  --   --   --  208*  --   --  176*  --   BUN 35*   < > 26*  --  19  --  17  --  16  --  14  --   --   --  14  --   --  15  --   CREATININE 1.75*   < > 1.60*  --  1.29*  --  1.21*  --  1.29*  --  1.12*  --   --   --  1.13*  --   --  0.93  --   CALCIUM 8.3*   < > 8.4*  --  8.0*  --  8.5*  --  8.7*  --  8.2*  --   --   --  8.6*  --   --  8.2*  --   PHOS 2.7  --  3.3  --  2.1*  --  2.9  --   --   --  2.2*  --   --   --  2.5  --   --  1.3*  --    < > = values in this interval not displayed.   CBC Recent Labs  Lab 02/18/20 0400 02/18/20 0416 02/18/20 1255 02/18/20 1639 02/18/20 1641 02/18/20 1641 02/18/20 1809 02/18/20 2024 02/19/20 0335 02/19/20 0350  WBC 7.2  --  9.0  --  10.9*  --   --   --  6.9  --   HGB 7.5*   < > 9.2*   < > 9.5*   < > 10.2* 9.9* 8.7* 9.9*  HCT 25.2*   < > 30.5*   < > 31.7*   < > 30.0* 29.0* 27.8* 29.0*  MCV 111.5*  --  107.0*  --  107.5*  --   --   --  107.8*  --   PLT 111*  --  119*  --  123*  --   --   --  117*  --    < > = values in this interval not displayed.    Medications:    .  amiodarone  200 mg Per Tube Daily  . vitamin C  500 mg Per Tube Daily  . chlorhexidine gluconate (MEDLINE KIT)  15 mL Mouth Rinse BID  . Chlorhexidine Gluconate Cloth  6 each Topical Q0600  . clonazePAM  1 mg Per Tube Q6H  . clotrimazole  1 Applicatorful Vaginal QHS  . docusate  100 mg Per Tube BID  . feeding supplement (PROSource TF)  45 mL Per Tube QID  . Gerhardt's butt cream   Topical BID  . linagliptin  5 mg Per Tube Daily  . lipase/protease/amylase)  20,880 Units Per Tube Once   And  . sodium bicarbonate  650 mg Per Tube Once  . mouth rinse  15 mL Mouth Rinse 10 times per day  . melatonin  9 mg Per Tube QHS  .  midodrine  10 mg Per Tube TID  . multivitamin  1 tablet Per Tube QHS  . oxyCODONE  10 mg Per Tube Q6H  . pantoprazole (PROTONIX) IV  40 mg Intravenous BID  . polyethylene glycol  17 g Per Tube Daily  . QUEtiapine  50 mg Per Tube QHS  . sennosides  5 mL Per Tube BID  . zinc sulfate  220 mg Per Tube Daily     Claudia Desanctis MD 02/19/2020, 7:04 AM

## 2020-02-19 NOTE — Progress Notes (Signed)
NAME:  Alisha Stephenson, MRN:  [id number], DOB:  01-18-1966, LOS: 22 ADMISSION DATE:  02/03/2020, CONSULTATION DATE:  8/3 REFERRING MD:  Alisha Stephenson, CHIEF COMPLAINT:  Dyspnea   Brief History   54 y/o female admitted on 8/2 with severe acute respiratory failure with hypoxemia due to COVID 19 pneumonia.  She developed symptoms 1 week prior to admission.  Past Medical History  DM2 Diverticulitis Gallstones Ovarian cyst NAFLD Asthma  Significant Hospital Events   8/2 admit 8/3 ICU transfer, intubated 8/4 prone, paralyze 8/9 significant desaturations today 8/9 VV ECMO cannulation 8/13 ICH 8/30 trach  Consults:  PCCM ECMO team  Procedures:  8/3 ETT > 8/30 8/30 8-0 shiley trach 8/3 PICC >  8/9 LIJ MML 8/9 RIJ [place] 32F  9/8 trialysis 9/10 PEG-J (IR)  Significant Diagnostic Tests:  7/31 CT head > NAICP 7/31 MRI/MRA brain > no acute changes, possibly small aneurysm ACOM 8/13 CT head> multiple areas of ICH   Micro Data:  8/2 blood > NG 8/2 SARS COV 2 > positive 8/4 resp > negative 8/4 urine >  8/12 blood > E. faecalis (pan-sensitive) 8/12 resp: staph aureus> MSSA 8/25 blood>> NG 8/24 BAL> yeast  Antimicrobials:  See fever tab for past abx Current: none  Interim history/subjective:  Denies complaints, working more with PT. Husband Alisha Stephenson at bedside reports that he has been working on her passive ROM and has been trying to motivate her but she has been frustrated with him.  Objective   Blood pressure (!) 123/104, pulse 100, temperature 97.8 F (36.6 C), temperature source Oral, resp. rate (!) 21, height _0  (1.626 m), weight 108.6 kg, SpO2 (!) 87 %.    Vent Mode: PCV FiO2 (%):  [40 %] 40 % Set Rate:  [15 bmp] 15 bmp PEEP:  [10 cmH20] 10 cmH20 Plateau Pressure:  [18 cmH20-20 cmH20] 20 cmH20   Intake/Output Summary (Last 24 hours) at 02/19/2020 0720 Last data filed at 02/19/2020 0700 Gross per 24 hour  Intake 2132.57 ml  Output 2614 ml  Net -481.43 ml    Filed Weights   02/17/20 0500 02/18/20 0500 02/19/20 0500  Weight: 115.4 kg 109.9 kg 108.6 kg    Examination:  GEN: Critically ill-appearing woman sitting up in bed working with physical therapy, on ECMO and mechanical ventilation HEENT: [place]/AT, eyes anicteric Neck: Tracheostomy without bleeding, right IJ ECMO cannulas CV: Regular rate and rhythm, no murmur PULM: Increasing breath sounds bilaterally, rales bilaterally; Vt ~150 GI: Obese, soft, PEG in place EXT: Improving edema, especially in the lower extremities.  Necrotic digit and distal right ring finger and thumb NEURO: Awake and interactive, moving lower extremities and right upper extremity recommend physical therapy.  Tracking, answering questions, attempting to talk. PSYCH: Motivated, cooperative with exam Derm: No erythema around wounds, gangrenous digits as above  Pressure Injury 02/14/20 Anus Medial Deep Tissue Pressure Injury - Purple or maroon localized area of discolored intact skin or blood-filled blister due to damage of underlying soft tissue from pressure and/or shear. (Active)  02/14/20 1300  Location: Anus  Location Orientation: Medial  Staging: Deep Tissue Pressure Injury - Purple or maroon localized area of discolored intact skin or blood-filled blister due to damage of underlying soft tissue from pressure and/or shear.  Wound Description (Comments):   Present on Admission: No     ABG paO2 80 BMP stable on CRRT CBC stable Blood culture 9/7 NG final Fibrinogen 547>>571>>481>>464>  431 >416>418 LDH 489>>401>>376>>326> 321> 303> 315 LA 1.2 PTT 68  Net - 460   Vent rate 15, pressure control 12, PEEP 10, 40% FiO2 with tidal volumes about 150 cc  CXR personally reviewed: improving airway consolidation, trach, trialysis, and ECMO cannulas in place  Resolved Hospital Problem list   MSSA pneumonia Fungal pneumonia- Candida dublinensis Enterococcal bacteremia-recent blood cultures cleared Concern for  possible embolic phenomenon- fingers and possibly CVAs were embolic  Assessment & Plan:    Critically ill due to acute hypoxic and AoC hypercapneic respiratory failure; likely underly OHS.  Requiring VV ECMO support and mechanical ventilation.  ARDS due to COVID 19 pneumonia Tracheostomy in place -Con't VV support, working towards weaning support, eventually sweep trials.  -Continue ultra lung protective ventilation.  No trach collar trials as this may delay ECMO weaning trials.  Okay to try Passy-Muir trials on the vent. -VAP prevention protocol -only PRN sedation; limiting as much as able to facilitate rehabitation -Routine tracheostomy care -off all antibiotics; monitoring for development of infections  Elevated LDH, hypo-fibrinogenemia and thrombocytopenia--improved since circuit change. -Con't routine ECMO labs  ICH, multifocal. L-sided weakness.  Critical illness myopathy--improved today. -Continue PT, OT, SLP.  Planning to start Passy-Muir valve trials tomorrow if tolerated. -Discussed with family the benefit of passive range of motion, especially of her left upper extremity to prevent contractures  Acute metabolic encephalopathy, improving -Continue minimizing sedation as much as possible.  Promote appropriate sleep wake cycles, avoid deliria genic medications -Mobility is much as feasible -Continue family visitation as much as possible  Acute renal failure, severe uremia.  Anuric. -Continue CRRT.  Appreciate nephrology's assistance. Goal net negative 25cc/h today. -Renally dose medications and avoid nephrotoxic meds -Strict I's/O -Bladder scan every shift  Shock related to RV strain - now resolved -Continue midodrine 10 3 times daily  Hyperglycemia > not controlled at all with [place] insulin despite aggressive upward titration.  Assume poor absorption, potentially due to subcutaneous edema.   -Improved on IV; when euvolemic may be able to tolerate [place] insulin. Will attempt  when back on regular TF dose  Best practice:  Diet: tube feeding Pain/Anxiety/Delirium protocol (if indicated): as above VAP protocol (if indicated): yes DVT prophylaxis: SCDs , heparin fixed dose GI prophylaxis: pantoprazole Glucose control:  insulin infusion  Mobility: bed rest Code Status: limited Family Communication: husband Alisha Stephenson updated at bedside today Disposition: ICU  This patient is critically ill with multiple organ system failure which requires frequent high complexity decision making, assessment, support, evaluation, and titration of therapies. This was completed through the application of advanced monitoring technologies and extensive interpretation of multiple databases. During this encounter critical care time was devoted to patient care services described in this note for 56 minutes.  Julian Hy, MD 02/19/20 8:54 PM [facility]

## 2020-02-19 NOTE — Progress Notes (Signed)
ANTICOAGULATION CONSULT NOTE - [place] for Heparin Indication: ECMO  Allergies  Allergen Reactions  . Sulfa Antibiotics Rash and Other (See Comments)    Patient Measurements: Height: 5\' 4"  (162.6 cm) Weight: 108.6 kg (239 lb 6.7 oz) IBW/kg (Calculated) : 54.7 Heparin Dosing Weight: 87 kg  Vital Signs: Temp: 97.4 F (36.3 C) (09/13 1600) Temp Source: Oral (09/13 1600) BP: 112/47 (09/13 1600) Pulse Rate: 84 (09/13 1700)  Labs: Recent Labs    02/18/20 1641 02/18/20 1809 02/19/20 0335 02/19/20 0350 02/19/20 1103 02/19/20 1103 02/19/20 1639 02/19/20 1700  HGB 9.5*   < > 8.7*   < > 10.5*   < > 10.5* 9.2*  HCT 31.7*   < > 27.8*   < > 31.0*  --  31.0* 30.9*  PLT 123*  --  117*  --   --   --   --  126*  APTT 39*  --  68*  --   --   --   --  60*  HEPARINUNFRC <0.10*  --  0.13*  --   --   --   --  0.13*  CREATININE 1.13*  --  0.93  --   --   --   --  0.91   < > = values in this interval not displayed.    Estimated Creatinine Clearance: 84.1 mL/min (by C-G formula based on SCr of 0.91 mg/dL).   Medical History: Past Medical History:  Diagnosis Date  . Asthma   . Diabetes mellitus without complication ([place])   . Diverticulitis   . Gallstones   . IBS (irritable bowel syndrome)   . NAFLD (nonalcoholic fatty liver disease)    CT scan 2015  . Ovarian cyst     Assessment: 54 yo female COVID+ on VV ECMO. Patient started on bivalirudin but was found to have multiple ICH on 8/13 head CT. Bivalirudin was stopped and pt was off anticoagulation at that time. Her ECMO circuit was changed 8/24. Pharmacy asked to start low-fixed rate heparin. Heparin infusion was held last week due to bleeding from trach site. Heparin was started at low fixed rate for pump patency insetting of ICH. Concern with increased  clotting off crrt so will start to titrate heparin to a low goal of  (~0.15-0.2 heparin levels). Hgb 7.8 this am. Plt stable. No bleeding issues noted.   Heparin drip 700 uts/hr heparin level 0.13 < goal - will increase slightly.    Goal of Therapy:  HL 0.15-0.2  Monitor platelets by anticoagulation protocol: Yes   Plan:  Increase heparin infusion to 750 units/hr Will recheck level in 8 hours Monitor q12 hours aptt for now, daily  CBC, LDH, fibrinogen, s/sx of bleeding  Alanda Slim, PharmD, [facility] Clinical Pharmacist Please see AMION for all Pharmacists' Contact Phone Numbers 02/19/2020, 5:46 PM

## 2020-02-19 NOTE — Plan of Care (Signed)
Problem: Education: Goal: Knowledge of General Education information will improve Description: Including pain rating scale, medication(s)/side effects and non-pharmacologic comfort measures Outcome: Progressing   Problem: Health Behavior/Discharge Planning: Goal: Ability to manage health-related needs will improve Outcome: Progressing   Problem: Clinical Measurements: Goal: Ability to maintain clinical measurements within normal limits will improve Outcome: Progressing Goal: Will remain free from infection Outcome: Progressing Goal: Diagnostic test results will improve Outcome: Progressing Goal: Respiratory complications will improve Outcome: Progressing Goal: Cardiovascular complication will be avoided Outcome: Progressing   Problem: Activity: Goal: Risk for activity intolerance will decrease Outcome: Progressing   Problem: Nutrition: Goal: Adequate nutrition will be maintained Outcome: Progressing   Problem: Coping: Goal: Level of anxiety will decrease Outcome: Progressing   Problem: Elimination: Goal: Will not experience complications related to bowel motility Outcome: Progressing Goal: Will not experience complications related to urinary retention Outcome: Progressing   Problem: Pain Managment: Goal: General experience of comfort will improve Outcome: Progressing   Problem: Safety: Goal: Ability to remain free from injury will improve Outcome: Progressing   Problem: Skin Integrity: Goal: Risk for impaired skin integrity will decrease Outcome: Progressing   Problem: Education: Goal: Knowledge of risk factors and measures for prevention of condition will improve Outcome: Progressing   Problem: Coping: Goal: Psychosocial and spiritual needs will be supported Outcome: Progressing   Problem: Respiratory: Goal: Will maintain a patent airway Outcome: Progressing Goal: Complications related to the disease process, condition or treatment will be avoided or  minimized Outcome: Progressing   Problem: Education: Goal: Knowledge of risk factors and measures for prevention of condition will improve Outcome: Progressing   Problem: Coping: Goal: Psychosocial and spiritual needs will be supported Outcome: Progressing   Problem: Respiratory: Goal: Will maintain a patent airway Outcome: Progressing Goal: Complications related to the disease process, condition or treatment will be avoided or minimized Outcome: Progressing   Problem: Education: Goal: Ability to describe self-care measures that may prevent or decrease complications (Diabetes Survival Skills Education) will improve Outcome: Progressing Goal: Individualized Educational Video(s) Outcome: Progressing   Problem: Coping: Goal: Ability to adjust to condition or change in health will improve Outcome: Progressing   Problem: Fluid Volume: Goal: Ability to maintain a balanced intake and output will improve Outcome: Progressing   Problem: Health Behavior/Discharge Planning: Goal: Ability to identify and utilize available resources and services will improve Outcome: Progressing Goal: Ability to manage health-related needs will improve Outcome: Progressing   Problem: Metabolic: Goal: Ability to maintain appropriate glucose levels will improve Outcome: Progressing   Problem: Nutritional: Goal: Maintenance of adequate nutrition will improve Outcome: Progressing Goal: Progress toward achieving an optimal weight will improve Outcome: Progressing   Problem: Skin Integrity: Goal: Risk for impaired skin integrity will decrease Outcome: Progressing   Problem: Tissue Perfusion: Goal: Adequacy of tissue perfusion will improve Outcome: Progressing   Problem: Education: Goal: Knowledge of risk factors and measures for prevention of condition will improve Outcome: Progressing   Problem: Coping: Goal: Psychosocial and spiritual needs will be supported Outcome: Progressing    Problem: Respiratory: Goal: Will maintain a patent airway Outcome: Progressing Goal: Complications related to the disease process, condition or treatment will be avoided or minimized Outcome: Progressing   Problem: Education: Goal: Knowledge of disease or condition will improve Outcome: Progressing Goal: Knowledge of secondary prevention will improve Outcome: Progressing Goal: Knowledge of patient specific risk factors addressed and post discharge goals established will improve Outcome: Progressing Goal: Individualized Educational Video(s) Outcome: Progressing   Problem: Coping: Goal: Will verbalize  positive feelings about self Outcome: Progressing Goal: Will identify appropriate support needs Outcome: Progressing   Problem: Health Behavior/Discharge Planning: Goal: Ability to manage health-related needs will improve Outcome: Progressing   Problem: Self-Care: Goal: Ability to participate in self-care as condition permits will improve Outcome: Progressing Goal: Verbalization of feelings and concerns over difficulty with self-care will improve Outcome: Progressing Goal: Ability to communicate needs accurately will improve Outcome: Progressing   Problem: Nutrition: Goal: Risk of aspiration will decrease Outcome: Progressing Goal: Dietary intake will improve Outcome: Progressing   Problem: Intracerebral Hemorrhage Tissue Perfusion: Goal: Complications of Intracerebral Hemorrhage will be minimized Outcome: Progressing   Problem: Ischemic Stroke/TIA Tissue Perfusion: Goal: Complications of ischemic stroke/TIA will be minimized Outcome: Progressing   Problem: Spontaneous Subarachnoid Hemorrhage Tissue Perfusion: Goal: Complications of Spontaneous Subarachnoid Hemorrhage will be minimized Outcome: Progressing

## 2020-02-19 NOTE — Progress Notes (Signed)
ANTICOAGULATION CONSULT NOTE - [place] for Heparin Indication: ECMO  Allergies  Allergen Reactions  . Sulfa Antibiotics Rash and Other (See Comments)    Patient Measurements: Height: 5\' 4"  (162.6 cm) Weight: 108.6 kg (239 lb 6.7 oz) IBW/kg (Calculated) : 54.7 Heparin Dosing Weight: 87 kg  Vital Signs: Temp: 97.5 F (36.4 C) (09/13 1200) Temp Source: Oral (09/13 1200) BP: 120/48 (09/13 1525) Pulse Rate: 86 (09/13 1600)  Labs: Recent Labs    02/18/20 0400 02/18/20 0416 02/18/20 1255 02/18/20 1639 02/18/20 1641 02/18/20 1809 02/19/20 0335 02/19/20 0335 02/19/20 0350 02/19/20 0350 02/19/20 0834 02/19/20 1103  HGB 7.5*   < > 9.2*   < > 9.5*   < > 8.7*   < > 9.9*   < > 9.9* 10.5*  HCT 25.2*   < > 30.5*   < > 31.7*   < > 27.8*   < > 29.0*  --  29.0* 31.0*  PLT 111*   < > 119*  --  123*  --  117*  --   --   --   --   --   APTT 40*  --   --   --  39*  --  68*  --   --   --   --   --   HEPARINUNFRC <0.10*  --   --   --  <0.10*  --  0.13*  --   --   --   --   --   CREATININE 1.12*  --   --   --  1.13*  --  0.93  --   --   --   --   --    < > = values in this interval not displayed.    Estimated Creatinine Clearance: 82.3 mL/min (by C-G formula based on SCr of 0.93 mg/dL).   Medical History: Past Medical History:  Diagnosis Date  . Asthma   . Diabetes mellitus without complication ([place])   . Diverticulitis   . Gallstones   . IBS (irritable bowel syndrome)   . NAFLD (nonalcoholic fatty liver disease)    CT scan 2015  . Ovarian cyst     Assessment: 54 yo female COVID+ on VV ECMO. Patient started on bivalirudin but was found to have multiple ICH on 8/13 head CT. Bivalirudin was stopped and pt was off anticoagulation at that time. Her ECMO circuit was changed 8/24. Pharmacy asked to start low-fixed rate heparin. Heparin infusion was held last week due to bleeding from trach site. Heparin was started at low fixed rate for pump patency  insetting of ICH. Concern with increased  clotting off crrt so will start to titrate heparin to a low goal of  (~0.15-0.2 heparin levels). Hgb 7.8 this am. Plt stable. No bleeding issues noted.  Heparin drip 700 uts/hr heparin level 0.13 < goal - will continue same and watch trend.    Goal of Therapy:  HL 0.15-0.2  Monitor platelets by anticoagulation protocol: Yes   Plan:  Continue  heparin infusion to 700 units/hr Monitor q12 hours aptt for now, daily  CBC, LDH, fibrinogen, s/sx of bleeding  Bonnita Nasuti Pharm.D. CPP, BCPS Clinical Pharmacist [phone] 02/19/2020 4:09 PM    Please check AMION for all [place] numbers 02/19/2020

## 2020-02-19 NOTE — Progress Notes (Addendum)
Physical Therapy Treatment Patient Details Name: Alisha Stephenson MRN: [id number] DOB: 1966-01-11 Today's Date: 02/19/2020    History of Present Illness 54 yo admitted 8/2 with hypoxemia due to Covid 19 PNA. 8/3 intubated. 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 CRRT 8/22-9/6, restarted 9/9. Reintubated 8/24 with ECMO circuit changed, 8/30 trach. 9/10 PEG tube. PMHx: DM, diverticulitis, Asthma    PT Comments    Pt with excellent progression this session. Pt very alert and communicating throughout session. Pt able to state abdomen soreness from back, sacral pain, wanting to have back scratched and following all commands. Pt able to left trunk from surface in standing and fully lean forward as well as in sitting lift trunk from surface with assist. Pt with increased strength and ROM actively for RUE moving arm off the surface and continues to demonstrate plantarflexion on RLE but no dorsiflexion.   Left side with noted movement for the first time with this therapist. Pt able to demonstrate trace plantarflexion, hamstring activation, left grip 2-/5, left shoulder flexion 1/5. Pt with marked improvement in cognition and participation. Pt tilted to 45 degrees for 15 min with VSS.  Spouse and RN present during session  HR 93 SpO2 92-97% on pressure control    Follow Up Recommendations  LTACH;Supervision/Assistance - 24 hour     Equipment Recommendations  Other (comment) (TBD with progression)    Recommendations for Other Services       Precautions / Restrictions Precautions Precautions: Other (comment) Precaution Comments: ECMO with RIJ access anchors at right shoulder and chest, right necrotic thumb and index finger, bruised/necrotic left hand, trach, vent, flexiseal, CRRT, PEG Other Brace: PRAFO Restrictions Weight Bearing Restrictions: No    Mobility  Bed Mobility Overal bed mobility: Needs Assistance             General bed  mobility comments: Max +3 to slide toward HOB. Mod +2 for trunk elevation from surface x 3 in sitting with pt engaging trunk to lean forward. Pt independently leaning trunk off bed in tilt with difficulty returning to extension  Transfers Overall transfer level: Needs assistance               General transfer comment: tilt bed to transition from supine to sit and supine to stand to 45 degrees  Ambulation/Gait                 Stairs             Wheelchair Mobility    Modified Rankin (Stroke Patients Only)       Balance Overall balance assessment: Needs assistance   Sitting balance-Leahy Scale: Zero Sitting balance - Comments: in chair position in chair mod +2 to anterior translation and balance in unsupported sitting                                    Cognition Arousal/Alertness: Awake/alert Behavior During Therapy: WFL for tasks assessed/performed Overall Cognitive Status: Impaired/Different from baseline Area of Impairment: Safety/judgement                         Safety/Judgement: Decreased awareness of deficits;Decreased awareness of safety     General Comments: pt mouthing words, following all commands, making thoughts known asking when she can get a shower      Exercises General Exercises - Upper Extremity Shoulder Flexion: AAROM;Both;Seated;5  reps (pt with noted activation bil with 1-2-/5 strength) Elbow Flexion: AAROM;PROM;Right;Left;Seated;10 reps (AAROM on RUE, PROM on LUE) General Exercises - Lower Extremity Ankle Circles/Pumps: AAROM;Supine;Both;10 reps (pt with 2-/5 plantarflexion bil LE, no active dorsiflexion) Quad Sets: AAROM;Both;Standing;5 reps Short Arc QuadSinclair Ship;Both;Seated;10 reps Heel Slides: AAROM;Both;10 reps;Supine (pt with trace 1/5 activation bil hamstring)    General Comments        Pertinent Vitals/Pain Pain Assessment: Faces Faces Pain Scale: Hurts little more Pain Location: grimace with  left knee flexion stating "butt hurts", sore stomach intermittently Pain Descriptors / Indicators: Grimacing Pain Intervention(s): Limited activity within patient's tolerance;Monitored during session;Repositioned    Home Living                      Prior Function            PT Goals (current goals can now be found in the care plan section) Acute Rehab PT Goals Time For Goal Achievement: 03/28/2020 Additional Goals Additional Goal #1: pt will follow single step commands on right side with 5/5 trials Progress towards PT goals: Progressing toward goals    Frequency    Min 3X/week      PT Plan Current plan remains appropriate    Co-evaluation              AM-PAC PT "6 Clicks" Mobility   Outcome Measure  Help needed turning from your back to your side while in a flat bed without using bedrails?: Total Help needed moving from lying on your back to sitting on the side of a flat bed without using bedrails?: Total Help needed moving to and from a bed to a chair (including a wheelchair)?: Total Help needed standing up from a chair using your arms (e.g., wheelchair or bedside chair)?: Total Help needed to walk in hospital room?: Total Help needed climbing 3-5 steps with a railing? : Total 6 Click Score: 6    End of Session   Activity Tolerance: Patient tolerated treatment well Patient left: in bed;with nursing/sitter in room;with family/visitor present Nurse Communication: Mobility status;Need for lift equipment PT Visit Diagnosis: Other abnormalities of gait and mobility (R26.89);Muscle weakness (generalized) (M62.81)     Time: 3559-7416 PT Time Calculation (min) (ACUTE ONLY): 54 min  Charges:  $Therapeutic Exercise: 23-37 mins $Therapeutic Activity: 23-37 mins                     Nayah Lukens P, PT Acute Rehabilitation Services Pager: [phone] Office: [place] 02/19/2020, 11:18 AM

## 2020-02-19 NOTE — Progress Notes (Signed)
Referring Physician(s): Dr. Tamala Julian   Supervising Physician: Corrie Mckusick  Patient Status:  [name] - In-pt  Chief Complaint: COVID; ECMO; s/p gastrostomy tube with jejunal extension.   Subjective: Patient asleep, husband at bedside. Per bedside RN G-J tube is working fine.   Allergies: Sulfa antibiotics  Medications: Prior to Admission medications   Medication Sig Start Date End Date Taking? Authorizing Provider  albuterol (PROVENTIL HFA;VENTOLIN HFA) 108 (90 Base) MCG/ACT inhaler Inhale 2 puffs into the lungs every 6 (six) hours as needed for wheezing or shortness of breath. 03/23/18   Shelda Pal, DO  azithromycin (ZITHROMAX) 250 MG tablet Take 2 tabs the first day and then 1 tab daily until you run out. 01/02/20   Nani Ravens, Crosby Oyster, DO  benzonatate (TESSALON) 200 MG capsule Take 200 mg by mouth 3 (three) times daily. 12/31/19   [provider]  Blood Glucose Monitoring Suppl (ONETOUCH VERIO) w/Device KIT Test as directed once weekly to check blood sugar. 04/25/18   Shelda Pal, DO  dicyclomine (BENTYL) 10 MG capsule Take 1 capsule (10 mg total) by mouth 4 (four) times daily -  before meals and at bedtime. 09/13/19   Shelda Pal, DO  doxycycline (VIBRA-TABS) 100 MG tablet Take 100 mg by mouth 2 (two) times daily. 10 day supply 12/31/19   [provider]  fluticasone (FLONASE) 50 MCG/ACT nasal spray PLACE 2 SPRAYS INTO BOTH NOSTRILS DAILY. 08/24/18   Saguier, Percell Miller, PA-C  glucose blood (ONETOUCH VERIO) test strip USE STRIP TO CHECK GLUCOSE THREE TIMES DAILY 08/31/18   Shelda Pal, DO  ibuprofen (ADVIL) 600 MG tablet Take 1 tablet (600 mg total) by mouth every 8 (eight) hours as needed. 01/06/20   Charlesetta Shanks, MD  insulin lispro (HUMALOG) 100 UNIT/ML KwikPen 15 units with each meal + 8 g/1 unit sliding scale. 06/13/19   Shelda Pal, DO  meloxicam (MOBIC) 7.5 MG tablet Take 1 tablet (7.5 mg total) by mouth  daily. 12/11/19   Tasia Catchings, Amy V, PA-C  ONE TOUCH LANCETS MISC Use once a week to check blood sugar. 04/25/18   Shelda Pal, DO  tiZANidine (ZANAFLEX) 2 MG tablet Take 1 tablet (2 mg total) by mouth every 8 (eight) hours as needed for muscle spasms. 12/11/19   Yu, Amy V, PA-C  TOUJEO MAX SOLOSTAR 300 UNIT/ML Solostar Pen INJECT 62 UNITS INTO THE SKIN 2 (TWO) TIMES DAILY. Patient taking differently: Inject 62 Units into the skin 2 (two) times daily.  11/24/19   Shelda Pal, DO     Vital Signs: BP (!) 105/46 Comment (BP Location): a-line  Pulse 84   Temp (!) 97.3 F (36.3 C) (Oral)   Resp 18   Ht 5' 4"  (1.626 m)   Wt 239 lb 6.7 oz (108.6 kg)   SpO2 97%   BMI 41.10 kg/m   Physical Exam Constitutional:      General: She is not in acute distress. Cardiovascular:     Rate and Rhythm: Normal rate and regular rhythm.  Pulmonary:     Comments: Trach/vent/ECMO Abdominal:     Comments: Gastrostomy tube in place. Dried drainage around site. No erythema or tenderness.   Skin:    General: Skin is warm and dry.     Imaging: IR GASTROSTOMY TUBE MOD SED  Result Date: 02/16/2020 INDICATION: 54 year old female currently on ECMO and at risk for aspiration. She requires percutaneous gastrojejunostomy access to allow for enteral feeding. EXAM: Fluoroscopically guided placement of  percutaneous pull-through gastrostomy tube Conversion to gastrojejunostomy tube Interventional Radiologist:  Criselda Peaches, MD MEDICATIONS: 2 g Ancef; Antibiotics were administered within 1 hour of the procedure. ANESTHESIA/SEDATION: Versed 2 mg IV; Fentanyl 50 mcg IV Moderate Sedation Time:  22 minutes The patient was continuously monitored during the procedure by the interventional radiology nurse under my direct supervision. CONTRAST:  29m OMNIPAQUE IOHEXOL 300 MG/ML  SOLN FLUOROSCOPY TIME:  Fluoroscopy Time: 8 minutes 42 seconds (145 mGy). COMPLICATIONS: None immediate. PROCEDURE: Informed written  consent was obtained from the patient after a thorough discussion of the procedural risks, benefits and alternatives. All questions were addressed. Maximal Sterile Barrier Technique was utilized including caps, mask, sterile gowns, sterile gloves, sterile drape, hand hygiene and skin antiseptic. A timeout was performed prior to the initiation of the procedure. Maximal barrier sterile technique utilized including caps, mask, sterile gowns, sterile gloves, large sterile drape, hand hygiene, and chlorhexadine skin prep. An angled catheter was advanced over a wire under fluoroscopic guidance through the nose, down the esophagus and into the body of the stomach. The stomach was then insufflated with several 100 ml of air. Fluoroscopy confirmed location of the gastric bubble, as well as inferior displacement of the barium stained colon. Under direct fluoroscopic guidance, a single T-tack was placed, and the anterior gastric wall drawn up against the anterior abdominal wall. Percutaneous access was then obtained into the mid gastric body with an 18 gauge sheath needle. Aspiration of air, and injection of contrast material under fluoroscopy confirmed needle placement. An Amplatz wire was advanced in the gastric body and the access needle exchanged for a 9-French vascular sheath. A snare device was advanced through the vascular sheath and an Amplatz wire advanced through the angled catheter. The Amplatz wire was successfully snared and this was pulled up through the esophagus and out the mouth. A 20-French KAlinda DoomsMIC-PEG tube was then connected to the snare and pulled through the mouth, down the esophagus, into the stomach and out to the anterior abdominal wall. Hand injection of contrast material confirmed intragastric location. The T-tack retention suture was then cut. A C2 cobra catheter and Glidewire were then advanced coaxially through the gastrostomy tube into the stomach. The catheter and wire combination was  successfully advanced through the pylorus and into the proximal small bowel. A 9 French jejunal limb was then advanced over the wire and position with the tip in the proximal jejunum. A gentle hand injection of contrast material was performed confirming the location of the tube. The external bumper was secured in place. The tube was flushed through the jejunal and gastric lumen and both were capped. The patient will be observed for several hours with the newly placed tube on low wall suction to evaluate for any post procedure complication. The patient tolerated the procedure well, there is no immediate complication. IMPRESSION: Successful placement of a 20 French pull through gastrostomy tube converted to a gastro jejunostomy tumor with the addition of a 9 [place]unal arm. The J arm tip is in the proximal jejunum and ready for use. Electronically Signed   By: HJacqulynn CadetM.D.   On: 02/16/2020 16:41   IR GASTR TUBE CONVERT GASTR-JEJ PER W/FL MOD SED  Result Date: 02/16/2020 INDICATION: 54year old female currently on ECMO and at risk for aspiration. She requires percutaneous gastrojejunostomy access to allow for enteral feeding. EXAM: Fluoroscopically guided placement of percutaneous pull-through gastrostomy tube Conversion to gastrojejunostomy tube Interventional Radiologist:  HCriselda Peaches MD MEDICATIONS: 2 g  Ancef; Antibiotics were administered within 1 hour of the procedure. ANESTHESIA/SEDATION: Versed 2 mg IV; Fentanyl 50 mcg IV Moderate Sedation Time:  22 minutes The patient was continuously monitored during the procedure by the interventional radiology nurse under my direct supervision. CONTRAST:  20m OMNIPAQUE IOHEXOL 300 MG/ML  SOLN FLUOROSCOPY TIME:  Fluoroscopy Time: 8 minutes 42 seconds (145 mGy). COMPLICATIONS: None immediate. PROCEDURE: Informed written consent was obtained from the patient after a thorough discussion of the procedural risks, benefits and alternatives. All questions  were addressed. Maximal Sterile Barrier Technique was utilized including caps, mask, sterile gowns, sterile gloves, sterile drape, hand hygiene and skin antiseptic. A timeout was performed prior to the initiation of the procedure. Maximal barrier sterile technique utilized including caps, mask, sterile gowns, sterile gloves, large sterile drape, hand hygiene, and chlorhexadine skin prep. An angled catheter was advanced over a wire under fluoroscopic guidance through the nose, down the esophagus and into the body of the stomach. The stomach was then insufflated with several 100 ml of air. Fluoroscopy confirmed location of the gastric bubble, as well as inferior displacement of the barium stained colon. Under direct fluoroscopic guidance, a single T-tack was placed, and the anterior gastric wall drawn up against the anterior abdominal wall. Percutaneous access was then obtained into the mid gastric body with an 18 gauge sheath needle. Aspiration of air, and injection of contrast material under fluoroscopy confirmed needle placement. An Amplatz wire was advanced in the gastric body and the access needle exchanged for a 9-French vascular sheath. A snare device was advanced through the vascular sheath and an Amplatz wire advanced through the angled catheter. The Amplatz wire was successfully snared and this was pulled up through the esophagus and out the mouth. A 20-French KAlinda DoomsMIC-PEG tube was then connected to the snare and pulled through the mouth, down the esophagus, into the stomach and out to the anterior abdominal wall. Hand injection of contrast material confirmed intragastric location. The T-tack retention suture was then cut. A C2 cobra catheter and Glidewire were then advanced coaxially through the gastrostomy tube into the stomach. The catheter and wire combination was successfully advanced through the pylorus and into the proximal small bowel. A 9 French jejunal limb was then advanced over the wire  and position with the tip in the proximal jejunum. A gentle hand injection of contrast material was performed confirming the location of the tube. The external bumper was secured in place. The tube was flushed through the jejunal and gastric lumen and both were capped. The patient will be observed for several hours with the newly placed tube on low wall suction to evaluate for any post procedure complication. The patient tolerated the procedure well, there is no immediate complication. IMPRESSION: Successful placement of a 20 French pull through gastrostomy tube converted to a gastro jejunostomy tumor with the addition of a 9 [place]unal arm. The J arm tip is in the proximal jejunum and ready for use. Electronically Signed   By: HJacqulynn CadetM.D.   On: 02/16/2020 16:41   DG CHEST PORT 1 VIEW  Result Date: 02/19/2020 CLINICAL DATA:  On ECMO.  ARDS. EXAM: PORTABLE CHEST 1 VIEW COMPARISON:  02/18/2020 FINDINGS: Tracheostomy tube overlies the airway. An ECMO cannula, right PICC, and left jugular venous catheter remain in place. The cardiomediastinal silhouette is unchanged. Widespread airspace opacities throughout both lungs are unchanged. Bilateral pleural effusions are similar to the prior study. No pneumothorax is identified. IMPRESSION: Unchanged diffuse bilateral airspace disease  and bilateral pleural effusions. Electronically Signed   By: Logan Bores M.D.   On: 02/19/2020 08:25   DG CHEST PORT 1 VIEW  Result Date: 02/18/2020 CLINICAL DATA:  Cardio respiratory failure.  ECMO catheter present. EXAM: PORTABLE CHEST 1 VIEW COMPARISON:  February 17, 2020 FINDINGS: Tracheostomy catheter tip is 5.6 cm above the carina. ECMO catheter seen on the right with tip below the diaphragm. Central catheter tip from the right side in the superior vena cava. Left-sided central catheter tip is in the superior vena cava. No pneumothorax. There is diffuse interstitial and airspace opacity bilaterally, somewhat more on  the right than on the left. Apparent superimposed pleural effusions noted. Heart upper normal in size with pulmonary vascularity grossly normal. No adenopathy evident. Postoperative change lower cervical spine. IMPRESSION: Tube and catheter positions as described without appreciable pneumothorax. Persistent pleural effusions with multifocal interstitial and alveolar opacity, similar to 1 day prior. Stable cardiac silhouette. Electronically Signed   By: Lowella Grip III M.D.   On: 02/18/2020 09:05   DG CHEST PORT 1 VIEW  Result Date: 02/17/2020 CLINICAL DATA:  Cardio respiratory failure, ECMO. EXAM: PORTABLE CHEST 1 VIEW COMPARISON:  02/16/2020 FINDINGS: RIGHT-sided PICC line obscured by ECMO cannula, extends into the mid to distal superior vena cava. LEFT-sided dual lumen catheter terminates at the proximal portion of the superior vena cava. ECMO cannula in stable position. Slightly improved aeration in the chest compared to the previous study. There is still diffuse interstitial and airspace disease largely obscuring the cardiomediastinal silhouette. Tracheostomy tube remains in place. Bilateral effusions largely stable. No acute skeletal process to the extent evaluated. IMPRESSION: 1. Stable support lines and tubes. 2. Slightly improved aeration in the chest compared to the previous study. 3. Stable bilateral effusions and with persistent diffuse interstitial and airspace disease. Electronically Signed   By: Zetta Bills M.D.   On: 02/17/2020 09:53   DG CHEST PORT 1 VIEW  Result Date: 02/16/2020 CLINICAL DATA:  COVID-19 infection on ECMO EXAM: PORTABLE CHEST 1 VIEW COMPARISON:  02/15/2020 FINDINGS: ECMO cannula, tracheostomy tube and left jugular temporary dialysis catheter are again noted and stable. Right-sided PICC line is seen extending to the cavoatrial junction. Gastric catheter extends into the stomach. Diffuse bilateral airspace opacity is noted with air bronchograms similar to that seen on  the prior exam. No significant improvement is noted. No bony abnormality is seen. IMPRESSION: Diffuse bilateral opacities consistent with the given clinical history. Tubes and lines as described stable in appearance. Electronically Signed   By: Inez Catalina M.D.   On: 02/16/2020 06:33   DG CHEST PORT 1 VIEW  Result Date: 02/15/2020 CLINICAL DATA:  Encounter for hemodialysis.  Respiratory distress. EXAM: PORTABLE CHEST 1 VIEW COMPARISON:  Radiograph earlier this day. FINDINGS: New left internal jugular dialysis catheter tip in the upper SVC. No visualized pneumothorax. Right internal jugular ECMO catheter tip in the upper abdomen not entirely included in the field of view. Tracheostomy tube tip at the thoracic inlet. Enteric tube remains in place. Near complete opacification throughout both hemi thoraces with slight improved aeration from prior exam and increasing ventilation in the perihilar lungs. IMPRESSION: 1. New left internal jugular dialysis catheter tip in the upper SVC. No visualized pneumothorax. 2. Additional support apparatus unchanged. 3. Near complete opacification throughout both lungs with slight improved aeration from prior exam and increasing ventilation in the perihilar lungs. Electronically Signed   By: Keith Rake M.D.   On: 02/15/2020 19:05    Labs:  CBC: Recent Labs    02/18/20 0400 02/18/20 0416 02/18/20 1255 02/18/20 1639 02/18/20 1641 02/18/20 1809 02/19/20 0335 02/19/20 0350 02/19/20 0834 02/19/20 1103  WBC 7.2  --  9.0  --  10.9*  --  6.9  --   --   --   HGB 7.5*   < > 9.2*   < > 9.5*   < > 8.7* 9.9* 9.9* 10.5*  HCT 25.2*   < > 30.5*   < > 31.7*   < > 27.8* 29.0* 29.0* 31.0*  PLT 111*  --  119*  --  123*  --  117*  --   --   --    < > = values in this interval not displayed.    COAGS: Recent Labs    02/03/20 0313 02/03/20 1630 02/04/20 0404 02/04/20 1548 02/05/20 0304 02/05/20 1700 02/05/20 2242 02/06/20 0306 02/17/20 1723 02/18/20 0400  02/18/20 1641 02/19/20 0335  INR 1.1  --  1.1  --  1.1  --  1.1  --   --   --   --   --   APTT 40*   < > 38*   < > 41*   < >  --    < > 36 40* 39* 68*   < > = values in this interval not displayed.    BMP: Recent Labs    02/17/20 1723 02/17/20 1725 02/18/20 0400 02/18/20 0416 02/18/20 1641 02/18/20 1809 02/19/20 0335 02/19/20 0350 02/19/20 0834 02/19/20 1103  NA 135   < > 137   < > 136   < > 137 138 137 138  K 4.0   < > 3.7   < > 4.2   < > 3.7 3.7 4.2 3.9  CL 100  --  103  --  101  --  103  --   --   --   CO2 23  --  25  --  25  --  26  --   --   --   GLUCOSE 145*  --  167*  --  208*  --  176*  --   --   --   BUN 16  --  14  --  14  --  15  --   --   --   CALCIUM 8.7*  --  8.2*  --  8.6*  --  8.2*  --   --   --   CREATININE 1.29*  --  1.12*  --  1.13*  --  0.93  --   --   --   GFRNONAA 47*  --  55*  --  55*  --  >60  --   --   --   GFRAA 54*  --  >60  --  >60  --  >60  --   --   --    < > = values in this interval not displayed.    LIVER FUNCTION TESTS: Recent Labs    02/02/20 0330 02/02/20 0330 02/03/20 0313 02/03/20 0313 02/04/20 0404 02/04/20 0404 02/05/20 0304 02/12/20 1130 02/17/20 1603 02/18/20 0400 02/18/20 1641 02/19/20 0335  BILITOT 1.9*  --  1.6*  --  1.8*  --  1.6*  --   --   --   --   --   AST 195*  --  162*  --  105*  --  73*  --   --   --   --   --  ALT 134*  --  165*  --  158*  --  151*  --   --   --   --   --   ALKPHOS 259*  --  252*  --  243*  --  264*  --   --   --   --   --   PROT 5.4*  --  5.7*  --  5.7*  --  5.7*  --   --   --   --   --   ALBUMIN 2.4*   < > 2.3*   < > 2.2*   < > 2.1*   < > 2.9* 2.5* 2.8* 2.4*   < > = values in this interval not displayed.    Assessment and Plan:  COVID; ECMO; s/p gastrostomy tube with jejunal extension: Tube is working as expected and patient has been receiving tube feeds.   IR will sign off. Please call with any questions/concerns.   Electronically Signed: Soyla Dryer,  AGACNP-BC [phone] 02/19/2020, 2:10 PM   I spent a total of 15 Minutes at the the patient's bedside AND on the patient's hospital floor or unit, greater than 50% of which was counseling/coordinating care for gastrostomy tube with jejunal extension.

## 2020-02-19 NOTE — Consult Note (Signed)
WOC Nurse Consult Note: Patient receiving care in [facility]. Turned by staff for wound evaluation. Reason for Consult: stage 2 on sacrum Wound type: Sacrum with an area that measures 4 cm x 4 cm and portions consistent with stage 2 PI, however, areas with slough are present also, making this an unstageable wound. A flexiseal is in place. Also, on the left lateral midback, there is a body fold with an area that likely started out as MASD-ITD, but is now an unstageable PI.  It measures 1.2 cm x 4.2 cm and is grey in color. Pressure Injury POA: No Measurement: Wound bed: Drainage (amount, consistency, odor) none Periwound: intact Dressing procedure/placement/frequency: Apply Santyl to sacral wound and the left back lateral body fold wound in a nickel thick layer. Cover with a saline moistened gauze, then foam dressings.  Change daily. Val Riles, RN, MSN, CWOCN, CNS-BC, pager [phone]

## 2020-02-20 ENCOUNTER — Inpatient Hospital Stay (HOSPITAL_COMMUNITY): Payer: PRIVATE HEALTH INSURANCE

## 2020-02-20 DIAGNOSIS — R5381 Other malaise: Secondary | ICD-10-CM

## 2020-02-20 LAB — CBC
HCT: 29.4 % — ABNORMAL LOW (ref 36.0–46.0)
HCT: 31.2 % — ABNORMAL LOW (ref 36.0–46.0)
Hemoglobin: 8.9 g/dL — ABNORMAL LOW (ref 12.0–15.0)
Hemoglobin: 9.6 g/dL — ABNORMAL LOW (ref 12.0–15.0)
MCH: 32.6 pg (ref 26.0–34.0)
MCH: 33.3 pg (ref 26.0–34.0)
MCHC: 30.3 g/dL (ref 30.0–36.0)
MCHC: 30.8 g/dL (ref 30.0–36.0)
MCV: 107.7 fL — ABNORMAL HIGH (ref 80.0–100.0)
MCV: 108.3 fL — ABNORMAL HIGH (ref 80.0–100.0)
Platelets: 113 10*3/uL — ABNORMAL LOW (ref 150–400)
Platelets: 121 10*3/uL — ABNORMAL LOW (ref 150–400)
RBC: 2.73 MIL/uL — ABNORMAL LOW (ref 3.87–5.11)
RBC: 2.88 MIL/uL — ABNORMAL LOW (ref 3.87–5.11)
RDW: 24.6 % — ABNORMAL HIGH (ref 11.5–15.5)
RDW: 24.1 % — ABNORMAL HIGH (ref 11.5–15.5)
WBC: 7.5 10*3/uL (ref 4.0–10.5)
WBC: 10.2 10*3/uL (ref 4.0–10.5)
nRBC: 0.9 % — ABNORMAL HIGH (ref 0.0–0.2)
nRBC: 1.2 % — ABNORMAL HIGH (ref 0.0–0.2)

## 2020-02-20 LAB — RENAL FUNCTION PANEL
Albumin: 2.5 g/dL — ABNORMAL LOW (ref 3.5–5.0)
Albumin: 2.7 g/dL — ABNORMAL LOW (ref 3.5–5.0)
Anion gap: 10 (ref 5–15)
Anion gap: 11 (ref 5–15)
BUN: 20 mg/dL (ref 6–20)
BUN: 21 mg/dL — ABNORMAL HIGH (ref 6–20)
CO2: 25 mmol/L (ref 22–32)
CO2: 24 mmol/L (ref 22–32)
Calcium: 8.3 mg/dL — ABNORMAL LOW (ref 8.9–10.3)
Calcium: 8.4 mg/dL — ABNORMAL LOW (ref 8.9–10.3)
Chloride: 100 mmol/L (ref 98–111)
Chloride: 101 mmol/L (ref 98–111)
Creatinine, Ser: 0.86 mg/dL (ref 0.44–1.00)
Creatinine, Ser: 0.98 mg/dL (ref 0.44–1.00)
GFR calc Af Amer: >60 mL/min (ref >60)
GFR calc Af Amer: >60 mL/min (ref >60)
GFR calc non Af Amer: >60 mL/min (ref >60)
GFR calc non Af Amer: >60 mL/min (ref >60)
Glucose, Bld: 172 mg/dL — ABNORMAL HIGH (ref 70–99)
Glucose, Bld: 188 mg/dL — ABNORMAL HIGH (ref 70–99)
Phosphorus: 1.9 mg/dL — ABNORMAL LOW (ref 2.5–4.6)
Phosphorus: 3.8 mg/dL (ref 2.5–4.6)
Potassium: 3.6 mmol/L (ref 3.5–5.1)
Potassium: 3.9 mmol/L (ref 3.5–5.1)
Sodium: 135 mmol/L (ref 135–145)
Sodium: 136 mmol/L (ref 135–145)

## 2020-02-20 LAB — POCT I-STAT 7, (LYTES, BLD GAS, ICA,H+H)
Acid-Base Excess: 2 mmol/L (ref 0.0–2.0)
Acid-Base Excess: 1 mmol/L (ref 0.0–2.0)
Acid-Base Excess: 0 mmol/L (ref 0.0–2.0)
Acid-Base Excess: 1 mmol/L (ref 0.0–2.0)
Acid-Base Excess: 0 mmol/L (ref 0.0–2.0)
Bicarbonate: 27 mmol/L (ref 20.0–28.0)
Bicarbonate: 27.2 mmol/L (ref 20.0–28.0)
Bicarbonate: 26.8 mmol/L (ref 20.0–28.0)
Bicarbonate: 27.3 mmol/L (ref 20.0–28.0)
Bicarbonate: 26.5 mmol/L (ref 20.0–28.0)
Calcium, Ion: 1.16 mmol/L (ref 1.15–1.40)
Calcium, Ion: 1.19 mmol/L (ref 1.15–1.40)
Calcium, Ion: 1.18 mmol/L (ref 1.15–1.40)
Calcium, Ion: 1.17 mmol/L (ref 1.15–1.40)
Calcium, Ion: 1.18 mmol/L (ref 1.15–1.40)
HCT: 28 % — ABNORMAL LOW (ref 36.0–46.0)
HCT: 29 % — ABNORMAL LOW (ref 36.0–46.0)
HCT: 30 % — ABNORMAL LOW (ref 36.0–46.0)
HCT: 30 % — ABNORMAL LOW (ref 36.0–46.0)
HCT: 30 % — ABNORMAL LOW (ref 36.0–46.0)
Hemoglobin: 9.5 g/dL — ABNORMAL LOW (ref 12.0–15.0)
Hemoglobin: 9.9 g/dL — ABNORMAL LOW (ref 12.0–15.0)
Hemoglobin: 10.2 g/dL — ABNORMAL LOW (ref 12.0–15.0)
Hemoglobin: 10.2 g/dL — ABNORMAL LOW (ref 12.0–15.0)
Hemoglobin: 10.2 g/dL — ABNORMAL LOW (ref 12.0–15.0)
O2 Saturation: 95 %
O2 Saturation: 81 %
O2 Saturation: 88 %
O2 Saturation: 93 %
O2 Saturation: 95 %
Patient temperature: 97.8
Patient temperature: 98
Patient temperature: 36.4
Patient temperature: 98
Patient temperature: 97.8
Potassium: 3.7 mmol/L (ref 3.5–5.1)
Potassium: 3.9 mmol/L (ref 3.5–5.1)
Potassium: 4.1 mmol/L (ref 3.5–5.1)
Potassium: 4.1 mmol/L (ref 3.5–5.1)
Potassium: 4.3 mmol/L (ref 3.5–5.1)
Sodium: 138 mmol/L (ref 135–145)
Sodium: 139 mmol/L (ref 135–145)
Sodium: 138 mmol/L (ref 135–145)
Sodium: 137 mmol/L (ref 135–145)
Sodium: 137 mmol/L (ref 135–145)
TCO2: 28 mmol/L (ref 22–32)
TCO2: 29 mmol/L (ref 22–32)
TCO2: 28 mmol/L (ref 22–32)
TCO2: 29 mmol/L (ref 22–32)
TCO2: 28 mmol/L (ref 22–32)
pCO2 arterial: 41.9 mmHg (ref 32.0–48.0)
pCO2 arterial: 48.6 mmHg — ABNORMAL HIGH (ref 32.0–48.0)
pCO2 arterial: 51.1 mmHg — ABNORMAL HIGH (ref 32.0–48.0)
pCO2 arterial: 50.3 mmHg — ABNORMAL HIGH (ref 32.0–48.0)
pCO2 arterial: 49.8 mmHg — ABNORMAL HIGH (ref 32.0–48.0)
pH, Arterial: 7.415 (ref 7.350–7.450)
pH, Arterial: 7.354 (ref 7.350–7.450)
pH, Arterial: 7.324 — ABNORMAL LOW (ref 7.350–7.450)
pH, Arterial: 7.34 — ABNORMAL LOW (ref 7.350–7.450)
pH, Arterial: 7.331 — ABNORMAL LOW (ref 7.350–7.450)
pO2, Arterial: 71 mmHg — ABNORMAL LOW (ref 83.0–108.0)
pO2, Arterial: 47 mmHg — ABNORMAL LOW (ref 83.0–108.0)
pO2, Arterial: 59 mmHg — ABNORMAL LOW (ref 83.0–108.0)
pO2, Arterial: 72 mmHg — ABNORMAL LOW (ref 83.0–108.0)
pO2, Arterial: 79 mmHg — ABNORMAL LOW (ref 83.0–108.0)

## 2020-02-20 LAB — GLUCOSE, CAPILLARY
Glucose-Capillary: 107 mg/dL — ABNORMAL HIGH (ref 70–99)
Glucose-Capillary: 163 mg/dL — ABNORMAL HIGH (ref 70–99)
Glucose-Capillary: 164 mg/dL — ABNORMAL HIGH (ref 70–99)
Glucose-Capillary: 169 mg/dL — ABNORMAL HIGH (ref 70–99)
Glucose-Capillary: 176 mg/dL — ABNORMAL HIGH (ref 70–99)
Glucose-Capillary: 177 mg/dL — ABNORMAL HIGH (ref 70–99)
Glucose-Capillary: 178 mg/dL — ABNORMAL HIGH (ref 70–99)
Glucose-Capillary: 181 mg/dL — ABNORMAL HIGH (ref 70–99)
Glucose-Capillary: 182 mg/dL — ABNORMAL HIGH (ref 70–99)
Glucose-Capillary: 184 mg/dL — ABNORMAL HIGH (ref 70–99)
Glucose-Capillary: 186 mg/dL — ABNORMAL HIGH (ref 70–99)
Glucose-Capillary: 194 mg/dL — ABNORMAL HIGH (ref 70–99)
Glucose-Capillary: 198 mg/dL — ABNORMAL HIGH (ref 70–99)
Glucose-Capillary: 200 mg/dL — ABNORMAL HIGH (ref 70–99)
Glucose-Capillary: 201 mg/dL — ABNORMAL HIGH (ref 70–99)
Glucose-Capillary: 206 mg/dL — ABNORMAL HIGH (ref 70–99)
Glucose-Capillary: 222 mg/dL — ABNORMAL HIGH (ref 70–99)

## 2020-02-20 LAB — BASIC METABOLIC PANEL
Anion gap: 11 (ref 5–15)
BUN: 22 mg/dL — ABNORMAL HIGH (ref 6–20)
CO2: 24 mmol/L (ref 22–32)
Calcium: 8.5 mg/dL — ABNORMAL LOW (ref 8.9–10.3)
Chloride: 100 mmol/L (ref 98–111)
Creatinine, Ser: 0.94 mg/dL (ref 0.44–1.00)
GFR calc Af Amer: >60 mL/min (ref >60)
GFR calc non Af Amer: >60 mL/min (ref >60)
Glucose, Bld: 197 mg/dL — ABNORMAL HIGH (ref 70–99)
Potassium: 4 mmol/L (ref 3.5–5.1)
Sodium: 135 mmol/L (ref 135–145)

## 2020-02-20 LAB — APTT
aPTT: 66 seconds — ABNORMAL HIGH (ref 24–36)
aPTT: 80 seconds — ABNORMAL HIGH (ref 24–36)

## 2020-02-20 LAB — LACTATE DEHYDROGENASE: LDH: 374 U/L — ABNORMAL HIGH (ref 98–192)

## 2020-02-20 LAB — LACTIC ACID, PLASMA
Lactic Acid, Venous: 1.8 mmol/L (ref 0.5–1.9)
Lactic Acid, Venous: 1.6 mmol/L (ref 0.5–1.9)

## 2020-02-20 LAB — HEPARIN LEVEL (UNFRACTIONATED)
Heparin Unfractionated: 0.11 IU/mL — ABNORMAL LOW (ref 0.30–0.70)
Heparin Unfractionated: 0.22 IU/mL — ABNORMAL LOW (ref 0.30–0.70)

## 2020-02-20 LAB — MAGNESIUM: Magnesium: 2.5 mg/dL — ABNORMAL HIGH (ref 1.7–2.4)

## 2020-02-20 LAB — FIBRINOGEN: Fibrinogen: 436 mg/dL (ref 210–475)

## 2020-02-20 MED ORDER — SODIUM PHOSPHATES 45 MMOLE/15ML IV SOLN
30.0000 mmol | Freq: Once | INTRAVENOUS | Status: AC
  Administered 2020-02-20: 30 mmol via INTRAVENOUS
  Filled 2020-02-20: qty 10

## 2020-02-20 MED ORDER — QUETIAPINE FUMARATE 100 MG PO TABS
100.0000 mg | ORAL_TABLET | Freq: Every day | ORAL | Status: DC
  Administered 2020-02-20 – 2020-03-03 (×13): 100 mg
  Filled 2020-02-20 (×13): qty 1

## 2020-02-20 NOTE — Progress Notes (Signed)
Occupational Therapy Treatment Patient Details Name: Alisha Stephenson MRN: [id number] DOB: Sep 29, 1965 Today's Date: 02/20/2020    History of present illness 54 yo admitted 8/2 with hypoxemia due to Covid 19 PNA. 8/3 intubated. 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 CRRT 8/22-9/6, restarted 9/9. Reintubated 8/24 with ECMO circuit changed, 8/30 trach. 9/10 PEG tube. PMHx: DM, diverticulitis, Asthma   OT comments  Pt much more animated today and mouthing words. +3 total assist to rotate pt to EOB with RNs managing lines. Pt sat EOB x 5 minutes with mod to max assist of 2, L side and posterior lean. Pt with improvement in L UE strength, performed AAROM all areas in supine.   Follow Up Recommendations  LTACH;Supervision/Assistance - 24 hour    Equipment Recommendations  Other (comment) (defer to next venue)    Recommendations for Other Services      Precautions / Restrictions Precautions Precautions: Other (comment) Precaution Comments: ECMO with RIJ access anchors at right shoulder and chest, right necrotic thumb and index finger, bruised/necrotic left hand, trach, vent, flexiseal, CRRT, PEG Required Braces or Orthoses: Other Brace Other Brace: B PRAFO       Mobility Bed Mobility Overal bed mobility: Needs Assistance Bed Mobility: Supine to Sit;Sit to Supine     Supine to sit: +2 for physical assistance;Total assist Sit to supine: +2 for physical assistance;Total assist   General bed mobility comments: +3 to rotate pt using bed pad to sit EOB, RNs managing CRRT and ECMO  Transfers                      Balance Overall balance assessment: Needs assistance Sitting-balance support: Bilateral upper extremity supported Sitting balance-Leahy Scale: Zero Sitting balance - Comments: supported sitting at EOb x 5 minutes with max-mod assist of 2  Postural control: Left lateral lean;Posterior lean                                  ADL either performed or assessed with clinical judgement   ADL                                         General ADL Comments: total assist     Vision       Perception     Praxis      Cognition Arousal/Alertness: Awake/alert Behavior During Therapy: WFL for tasks assessed/performed Overall Cognitive Status: Difficult to assess                                 General Comments: pt talkative throughout session, following commands, goal of being able to sit on [facility] for BM        Exercises Exercises: General Upper Extremity General Exercises - Upper Extremity Shoulder Flexion: Left;10 reps;Supine Elbow Flexion: AAROM;10 reps;Left;Supine Elbow Extension: AAROM;Left;10 reps;Supine Wrist Flexion: AAROM;Left;5 reps;Supine Wrist Extension: AAROM;Left;5 reps;Supine Digit Composite Flexion: PROM;AAROM;5 reps;Seated;Left Composite Extension: PROM;AAROM;5 reps;Seated;Left   Shoulder Instructions       General Comments      Pertinent Vitals/ Pain       Pain Assessment: Faces Faces Pain Scale: Hurts little more Pain Location: buttocks Pain Descriptors / Indicators: Grimacing;Sore Pain Intervention(s): Monitored during session;Repositioned  Home Living  Prior Functioning/Environment              Frequency  Min 2X/week        Progress Toward Goals  OT Goals(current goals can now be found in the care plan section)  Progress towards OT goals: Progressing toward goals  Acute Rehab OT Goals Patient Stated Goal: per husband, to see her dog [name] OT Goal Formulation: Patient unable to participate in goal setting Time For Goal Achievement: 02/23/20 Potential to Achieve Goals: [place] Discharge plan remains appropriate    Co-evaluation    PT/OT/SLP Co-Evaluation/Treatment: Yes Reason for Co-Treatment: Complexity of the patient's impairments (multi-system  involvement)   OT goals addressed during session: Strengthening/ROM      AM-PAC OT "6 Clicks" Daily Activity     Outcome Measure   Help from another person eating meals?: Total Help from another person taking care of personal grooming?: Total Help from another person toileting, which includes using toliet, bedpan, or urinal?: Total Help from another person bathing (including washing, rinsing, drying)?: Total Help from another person to put on and taking off regular upper body clothing?: Total Help from another person to put on and taking off regular lower body clothing?: Total 6 Click Score: 6    End of Session    OT Visit Diagnosis: Muscle weakness (generalized) (M62.81);Other symptoms and signs involving cognitive function   Activity Tolerance Patient tolerated treatment well   Patient Left in bed;with call bell/phone within reach;with family/visitor present;with nursing/sitter in room   Nurse Communication          Time: 8891-6945 OT Time Calculation (min): 29 min  Charges: OT General Charges $OT Visit: 1 Visit OT Treatments $Neuromuscular Re-education: 8-22 mins  Nestor Lewandowsky, OTR/L Acute Rehabilitation Services Pager: [phone] Office: [phone]   Malka So 02/20/2020, 11:29 AM

## 2020-02-20 NOTE — Evaluation (Signed)
Passy-Muir Speaking Valve - Evaluation Patient Details  Name: Alisha Stephenson MRN: [id number] Date of Birth: [birth year]  Today's Date: 02/20/2020 Time: 0830-0922 SLP Time Calculation (min) (ACUTE ONLY): 52 min  Past Medical History:  Past Medical History:  Diagnosis Date  . Asthma   . Diabetes mellitus without complication ([place])   . Diverticulitis   . Gallstones   . IBS (irritable bowel syndrome)   . NAFLD (nonalcoholic fatty liver disease)    CT scan 2015  . Ovarian cyst    Past Surgical History:  Past Surgical History:  Procedure Laterality Date  . ABDOMINAL HYSTERECTOMY    . CESAREAN SECTION    . CHOLECYSTECTOMY    . COLONOSCOPY     about 2011  . ECMO CANNULATION N/A 01/28/2020   Procedure: ECMO CANNULATION;  Surgeon: Jolaine Artist, MD;  Location: [facility];  Service: Cardiovascular;  Laterality: N/A;  . IR GASTR TUBE CONVERT GASTR-JEJ PER W/FL MOD SED  02/16/2020  . IR GASTROSTOMY TUBE MOD SED  02/16/2020   HPI:  54 y/o female admitted on 8/2 with severe acute respiratory failure with hypoxemia due to COVID 19 pneumonia.  She developed symptoms 1 week prior to admission. Intubated 8/3, ECMO started 8/9, ICH on 8/13 Ct reads Multifocal parenchymal hemorrhages, with the largest within the right parietal lobe. Trach placed 8/30, PEG on 9/10. MD writes on 9/13 "ultra lung protective ventilation.  No trach collar trials as this may delay ECMO weaning trials.  Okay to try Passy-Muir trials on the vent."   Assessment / Plan / Recommendation Clinical Impression  First attempt with inline PMSV successful. Pt very cooperative, though restless and distracted by physical discomfort. MD and RT both present and assisting with ventilator adjustments. Pt on ECMO with stable vital signs (O2 sats at 88 are adequate for ECMO per MD) PCV mode, with baseline inspiratory and expiratory volumes around 130 cc max. Pressure above PEEP at 12 and PEEP at 10 at baseline. After dropping PEEP to  8 and then to 6 pts cuff was slowly deflated with expiratory volumes at 8 cc indicating patent upper airway. Pt with cuff leak aphonic speech. When PMSV placed some ventilator adjustments were made; PC above PEEP set to 13 (PIP 18), trigger flow changed from -5 to 3, RR dropped to 14 (though pt breathing almost 40 bpm). Pt observed to have prolonged inspiratory times still not triggering exhalation efficiently and not able to initiate phonation with exhalation. Pt responsive to cues to inspire, hold breath and exhale, though inspiratory time again abnormally long. Will need to consider more natural adjustments to breathing pattern in the setting of very small tidal volumes. Attempting trial on pressure support may correct this problem, which Dr. [name] said would be ok.  Will continue efforts as frequently as possible to target functional communication and restore upper airway strength and control.  SLP Visit Diagnosis: Aphonia (R49.1)    SLP Assessment  Patient needs continued Speech Lanaguage Pathology Services    Follow Up Recommendations       Frequency and Duration min 2x/week  2 weeks    PMSV Trial PMSV was placed for: 10 minutes Able to redirect subglottic air through upper airway: Yes Able to Attain Phonation: No Voice Quality: Aphonic;Breathy Able to Expectorate Secretions: Yes Level of Secretion Expectoration with PMSV: Oral Breath Support for Phonation: Severely decreased Intelligibility: Intelligibility reduced Word: 50-74% accurate Phrase: 25-49% accurate Respirations During Trial: (!) 39 (max 55) SpO2 During Trial: (!) 88 %  Behavior: Alert;Cooperative   Tracheostomy Tube       Vent Dependency  Vent Dependent: Yes Vent Mode: PCV Set Rate: 15 bmp PEEP: 10 cmH20 FiO2 (%): 40 %    Cuff Deflation Trial  GO Tolerated Cuff Deflation: Yes Length of Time for Cuff Deflation Trial: 12 minutes Behavior: Alert;Cooperative        Sulo Janczak, Katherene Ponto 02/20/2020, 11:15  AM

## 2020-02-20 NOTE — Progress Notes (Addendum)
RT spoke with Speech Therapy this AM to schedule a time to perform inline passy muir speaking valve. Upon arrival Dr. [name] had started the procedure with ST. Dr. [name] had dropped PEEP to 6. RT thenwas at bedside along with ST performing inline passy muir speaking valve. Changes that were made during procedure by RT were, trigger flow was changed to 3, PEEP decreased to 6, RR decreased to 12, PC increased to 13. Pt tolerated well with stable VS during procedure. RT placed pt back to normal vent settings.

## 2020-02-20 NOTE — Progress Notes (Signed)
[facility] Progress Note    Assessment/ Plan:    72F severe MSOF from COVID-19 pneumonia requiring VV ECMO.  1.  Anuric AKI due to shock, sepsis causing acute tubular necrosis:CRRT on 8/22- 02/12/20. IHD 02/13/20.  Planned for 9/9 but back to CRRT d/t fluid overload.  L IJ nontunneled catheter changed 9/9 from L [place].   - Continue CRRT.  On All 4K.  Reduced dialysate to 1.5 L/hr from 2 on 9/13.  Note renal added flat rate heparin 500 u/ hr to circuit 02/18/20 d/t clotting x 2.   - UF goal net neg 50 mL/ hr as tolerated    2.Shock due to sepsis, Covid infection, blood cultures positive for Enterococcus bacteremia: s/p Zosyn (ended 9/7), cultures repeated 9/7, NGTD.  TTE 9/7 no AI, done for concern of wide pulse pressure  3.Acute hypoxic and hypercapnic respiratory failure, ARDS due to COVID-19 pneumonia: on VV ECMO for refractory hypoxemia.  3.Hypernatremia, hypervolemia: relative free water deficit. Improved with CRRT.  4.Acute toxic metabolic encephalopathy:Multifactorial, improving  5.Anemia, thrombocytopenia: With hemolysis likely and acute illness contributing. Transfusions per primary team.  6.Intracranial hemorrhage: With left-sided weakness, ? Septic emboli  7.  Diabetes per primary  8.  Hypophosphatemia repleting - sodium phos 30 mmol ordered 9/14 AM  9.  Critical illness myopathy: PT as able per primary team    Subjective:    Has been on CRRT.  Goal of net neg 25 to 50 an hour as tolerated.  No urine output charted.  Had 2.9 L UF with CRRT over 9/13.  Continues on ECMO.  Review of systems:   Some shortness of breath Denies chest pain  Denies n/v    Objective:   BP (!) 144/61   Pulse 93   Temp (!) 97.4 F (36.3 C) (Oral)   Resp (!) 26   Ht 5' 4"  (1.626 m)   Wt 110.8 kg   SpO2 92%   BMI 41.93 kg/m   Intake/Output Summary (Last 24 hours) at 02/20/2020 3817 Last data filed at 02/20/2020 0600 Gross per 24 hour  Intake 2344.21 ml  Output  3021 ml  Net -676.79 ml   Weight change: 2.2 kg  Physical Exam:   Gen: critically ill, lying in bed in NAD NECK: R IJ ECMO access CVS: RRR Resp: reduced on auscultation  Abd: obese Ext: trace edema bilaterally ACCESS: L IJ nontunneled HD catheter MSK: necrotic thumb and 1st finger R hand Psych no anxiety or agitation Neuro - alert and oriented - provides hx and follows commands  Imaging: DG CHEST PORT 1 VIEW  Result Date: 02/19/2020 CLINICAL DATA:  On ECMO.  ARDS. EXAM: PORTABLE CHEST 1 VIEW COMPARISON:  02/18/2020 FINDINGS: Tracheostomy tube overlies the airway. An ECMO cannula, right PICC, and left jugular venous catheter remain in place. The cardiomediastinal silhouette is unchanged. Widespread airspace opacities throughout both lungs are unchanged. Bilateral pleural effusions are similar to the prior study. No pneumothorax is identified. IMPRESSION: Unchanged diffuse bilateral airspace disease and bilateral pleural effusions. Electronically Signed   By: Logan Bores M.D.   On: 02/19/2020 08:25    Labs: BMET Recent Labs  Lab 02/17/20 0219 02/17/20 0440 02/17/20 1603 02/17/20 1603 02/17/20 1723 02/17/20 1725 02/18/20 0400 02/18/20 0416 02/18/20 1641 02/18/20 1809 02/19/20 0335 02/19/20 0350 02/19/20 1641 02/19/20 1700 02/19/20 1756 02/19/20 2006 02/19/20 2340 02/20/20 0233 02/20/20 0552  NA 136   < > 136   < > 135   < > 137   < >  136   < > 137   < > 134* 136 138 138 138 135 138  K 3.5   < > 4.3   < > 4.0   < > 3.7   < > 4.2   < > 3.7   < > 4.1 4.1 3.8 4.2 4.0 3.6 3.7  CL 103   < > 101   < > 100  --  103  --  101  --  103  --  99 101  --   --   --  100  --   CO2 22   < > 23   < > 23  --  25  --  25  --  26  --  25 26  --   --   --  25  --   GLUCOSE 97   < > 153*   < > 145*  --  167*  --  208*  --  176*  --  188* 187*  --   --   --  172*  --   BUN 19   < > 17   < > 16  --  14  --  14  --  15  --  18 18  --   --   --  20  --   CREATININE 1.29*   < > 1.21*   < >  1.29*  --  1.12*  --  1.13*  --  0.93  --  0.86 0.91  --   --   --  0.86  --   CALCIUM 8.0*   < > 8.5*   < > 8.7*  --  8.2*  --  8.6*  --  8.2*  --  8.1* 8.1*  --   --   --  8.3*  --   PHOS 2.1*  --  2.9  --   --   --  2.2*  --  2.5  --  1.3*  --  3.5  --   --   --   --  1.9*  --    < > = values in this interval not displayed.   CBC Recent Labs  Lab 02/18/20 1641 02/18/20 1809 02/19/20 0335 02/19/20 0350 02/19/20 1700 02/19/20 1756 02/19/20 2006 02/19/20 2340 02/20/20 0232 02/20/20 0552  WBC 10.9*  --  6.9  --  9.8  --   --   --  7.5  --   HGB 9.5*   < > 8.7*   < > 9.2*   < > 10.5* 10.2* 8.9* 9.5*  HCT 31.7*   < > 27.8*   < > 30.9*   < > 31.0* 30.0* 29.4* 28.0*  MCV 107.5*  --  107.8*  --  108.0*  --   --   --  107.7*  --   PLT 123*  --  117*  --  126*  --   --   --  113*  --    < > = values in this interval not displayed.    Medications:    . amiodarone  200 mg Per Tube Daily  . vitamin C  500 mg Per Tube Daily  . chlorhexidine gluconate (MEDLINE KIT)  15 mL Mouth Rinse BID  . Chlorhexidine Gluconate Cloth  6 each Topical Q0600  . clonazePAM  1 mg Per Tube Q6H  . clotrimazole  1 Applicatorful Vaginal QHS  . collagenase   Topical Daily  . docusate  100 mg  Per Tube BID  . feeding supplement (PROSource TF)  45 mL Per Tube QID  . Gerhardt's butt cream   Topical BID  . linagliptin  5 mg Per Tube Daily  . lipase/protease/amylase)  20,880 Units Per Tube Once   And  . sodium bicarbonate  650 mg Per Tube Once  . mouth rinse  15 mL Mouth Rinse 10 times per day  . melatonin  9 mg Per Tube QHS  . midodrine  10 mg Per Tube TID  . multivitamin  1 tablet Per Tube QHS  . oxyCODONE  10 mg Per Tube Q6H  . pantoprazole (PROTONIX) IV  40 mg Intravenous BID  . polyethylene glycol  17 g Per Tube Daily  . QUEtiapine  50 mg Per Tube QHS  . sennosides  5 mL Per Tube BID  . zinc sulfate  220 mg Per Tube Daily     Claudia Desanctis MD 02/20/2020, 7:11 AM

## 2020-02-20 NOTE — Progress Notes (Signed)
Physical Therapy Treatment Patient Details Name: Alisha Stephenson MRN: [id number] DOB: Dec 14, 1965 Today's Date: 02/20/2020    History of Present Illness 54 yo admitted 8/2 with hypoxemia due to Covid 19 PNA. 8/3 intubated. 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 CRRT 8/22-9/6, restarted 9/9. Reintubated 8/24 with ECMO circuit changed, 8/30 trach. 9/10 PEG tube. PMHx: DM, diverticulitis, Asthma    PT Comments    Pt very talkative and interactive throughout session asking "when can I get up to shit", "I want people to hear me". Pt with ability to sit EOB with assist of 4 people for transfer to EOB and back with pt limited by fatigue and sacral pain in sitting. Pt very motivated and demonstrated movement in all extremities today with pt assisting with reaching right arm over her trunk for rolling. Will continue to progress.   93% SpO2    Follow Up Recommendations  LTACH;Supervision/Assistance - 24 hour     Equipment Recommendations  Other (comment) (TBD)    Recommendations for Other Services       Precautions / Restrictions Precautions Precautions: Other (comment) Precaution Comments: ECMO with RIJ access anchors at right shoulder and chest, right necrotic thumb and index finger, bruised/necrotic left hand, trach, vent, flexiseal, CRRT, PEG Required Braces or Orthoses: Other Brace Other Brace: B PRAFO    Mobility  Bed Mobility Overal bed mobility: Needs Assistance Bed Mobility: Supine to Sit;Sit to Supine Rolling: Total assist;+2 for physical assistance   Supine to sit: +2 for physical assistance;Total assist Sit to supine: +2 for physical assistance;Total assist   General bed mobility comments: +3 to rotate pt using bed pad to sit EOB, RNs managing CRRT and ECMO. Pt sat EOB 5 min prior to fatigue  Transfers                    Ambulation/Gait                 Stairs             Wheelchair Mobility     Modified Rankin (Stroke Patients Only)       Balance Overall balance assessment: Needs assistance Sitting-balance support: Bilateral upper extremity supported Sitting balance-Leahy Scale: Zero Sitting balance - Comments: supported sitting at EOb x 5 minutes with max-mod assist of 2. pt performed right forearm propping in sitting Postural control: Left lateral lean;Posterior lean                                  Cognition Arousal/Alertness: Awake/alert Behavior During Therapy: WFL for tasks assessed/performed Overall Cognitive Status: Difficult to assess Area of Impairment: Safety/judgement                         Safety/Judgement: Decreased awareness of deficits;Decreased awareness of safety     General Comments: pt talkative throughout session, following commands, goal of being able to sit on [facility] for BM      Exercises General Exercises - Upper Extremity Shoulder Flexion: Left;10 reps;Supine Elbow Flexion: AAROM;10 reps;Left;Supine Elbow Extension: AAROM;Left;10 reps;Supine Wrist Flexion: AAROM;Left;5 reps;Supine Wrist Extension: AAROM;Left;5 reps;Supine Digit Composite Flexion: PROM;AAROM;5 reps;Seated;Left Composite Extension: PROM;AAROM;5 reps;Seated;Left    General Comments        Pertinent Vitals/Pain Pain Assessment: Faces Faces Pain Scale: Hurts little more Pain Location: buttocks Pain Descriptors / Indicators: Grimacing;Sore Pain Intervention(s): Limited activity within patient's tolerance;Monitored  during session;Repositioned    Home Living                      Prior Function            PT Goals (current goals can now be found in the care plan section) Acute Rehab PT Goals Patient Stated Goal: per husband, to see her dog [name] Progress towards PT goals: Progressing toward goals    Frequency    Min 3X/week      PT Plan Current plan remains appropriate    Co-evaluation PT/OT/SLP Co-Evaluation/Treatment:  Yes Reason for Co-Treatment: Complexity of the patient's impairments (multi-system involvement);For patient/therapist safety PT goals addressed during session: Mobility/safety with mobility;Balance OT goals addressed during session: Strengthening/ROM      AM-PAC PT "6 Clicks" Mobility   Outcome Measure  Help needed turning from your back to your side while in a flat bed without using bedrails?: Total Help needed moving from lying on your back to sitting on the side of a flat bed without using bedrails?: Total Help needed moving to and from a bed to a chair (including a wheelchair)?: Total Help needed standing up from a chair using your arms (e.g., wheelchair or bedside chair)?: Total Help needed to walk in hospital room?: Total Help needed climbing 3-5 steps with a railing? : Total 6 Click Score: 6    End of Session   Activity Tolerance: Patient tolerated treatment well Patient left: in bed;with nursing/sitter in room;with family/visitor present Nurse Communication: Mobility status;Need for lift equipment PT Visit Diagnosis: Other abnormalities of gait and mobility (R26.89);Muscle weakness (generalized) (M62.81)     Time: 7619-5093 PT Time Calculation (min) (ACUTE ONLY): 29 min  Charges:  $Therapeutic Activity: 8-22 mins                     Adryana Mogensen P, PT Acute Rehabilitation Services Pager: [phone] Office: [phone]    Suri Tafolla B Zuly Belkin 02/20/2020, 12:45 PM

## 2020-02-20 NOTE — Progress Notes (Signed)
[place] for Heparin Indication: ECMO  Allergies  Allergen Reactions  . Sulfa Antibiotics Rash and Other (See Comments)    Patient Measurements: Height: 5\' 4"  (162.6 cm) Weight: 108.6 kg (239 lb 6.7 oz) IBW/kg (Calculated) : 54.7 Heparin Dosing Weight: 87 kg  Vital Signs: BP: 144/61 (09/13 2000) Pulse Rate: 89 (09/14 0200)  Labs: Recent Labs    02/19/20 0335 02/19/20 0350 02/19/20 1639 02/19/20 1641 02/19/20 1700 02/19/20 1756 02/19/20 2006 02/19/20 2006 02/19/20 2340 02/20/20 0232 02/20/20 0233  HGB 8.7*   < >   < >  --  9.2*   < > 10.5*   < > 10.2* 8.9*  --   HCT 27.8*   < >   < >  --  30.9*   < > 31.0*  --  30.0* 29.4*  --   PLT 117*  --   --   --  126*  --   --   --   --  113*  --   APTT 68*  --   --   --  60*  --   --   --   --  66*  --   HEPARINUNFRC 0.13*  --   --   --  0.13*  --   --   --   --  0.11*  --   CREATININE 0.93   < >  --  0.86 0.91  --   --   --   --   --  0.86   < > = values in this interval not displayed.    Estimated Creatinine Clearance: 89 mL/min (by C-G formula based on SCr of 0.86 mg/dL).  Assessment: 54 yo female COVID+ on VV ECMO for heparin  Goal of Therapy:  HL 0.15-0.2  Monitor platelets by anticoagulation protocol: Yes   Plan:  Increase Heparin 850 units/hr  Phillis Knack, PharmD, BCPS  02/20/2020  4:02 AM

## 2020-02-20 NOTE — Procedures (Signed)
Extracorporeal support note   ECLS support day: 36 (cannulated 02/02/2020) Indication: Covid 19 pneumonia/ARDS  Configuration: VV  Drainage cannula: right IJ [place]  Return cannula: same  Pump speed: 4100 Pump flow: 4.7 Pump used: Centrimag  Oxygenator: Quadrox O2 blender: 100% Sweep gas: 10.5  ABG    Component Value Date/Time   PHART 7.415 02/20/2020 0552   PCO2ART 41.9 02/20/2020 0552   PO2ART 71 (L) 02/20/2020 0552   HCO3 27.0 02/20/2020 0552   TCO2 28 02/20/2020 0552   ACIDBASEDEF 1.0 02/15/2020 1947   O2SAT 95.0 02/20/2020 0552    Circuit check: minimal visible clot- only fibrin in corners  Anticoagulant: Continue heparin Anticoagulation targets: heparin level 0.2  Changes in support: Start PMV trials with speech today. Con't rehab efforts. Increase nightly seroquel due to sundowning and agitation.  Anticipated goals/duration of support: bridge to recovery  Julian Hy, MD 02/20/20 9:37 PM [facility]

## 2020-02-20 NOTE — Progress Notes (Signed)
[place] for Heparin Indication: ECMO  Allergies  Allergen Reactions  . Sulfa Antibiotics Rash and Other (See Comments)    Patient Measurements: Height: 5\' 4"  (162.6 cm) Weight: 110.8 kg (244 lb 4.3 oz) IBW/kg (Calculated) : 54.7 Heparin Dosing Weight: 87 kg  Vital Signs: BP: 103/45 (09/14 1550) Pulse Rate: 99 (09/14 1700)  Labs: Recent Labs    02/19/20 0335 02/19/20 1641 02/19/20 1700 02/19/20 1756 02/20/20 0232 02/20/20 0233 02/20/20 0552 02/20/20 1203 02/20/20 1203 02/20/20 1618 02/20/20 1621 02/20/20 1628  HGB   < >  --  9.2*   < > 8.9*  --    < > 10.2*   < > 9.6*  --  10.2*  HCT   < >  --  30.9*   < > 29.4*  --    < > 30.0*  --  31.2*  --  30.0*  PLT   < >  --  126*  --  113*  --   --   --   --  121*  --   --   APTT   < >  --  60*  --  66*  --   --   --   --  80*  --   --   HEPARINUNFRC   < >  --  0.13*  --  0.11*  --   --   --   --   --  0.22*  --   CREATININE  --  0.86 0.91  --   --  0.86  --   --   --   --   --   --    < > = values in this interval not displayed.    Estimated Creatinine Clearance: 90 mL/min (by C-G formula based on SCr of 0.86 mg/dL).  Assessment: 54 yo female COVID+ on VV ECMO. Patient started on bivalirudin but was found to have multiple ICH on 8/13 head CT. Bivalirudin was stopped and pt was off anticoagulation at that time. Her ECMO circuit was changed 8/24. Pharmacy asked to start low-fixed rate heparin. Heparin infusion was held last week due to bleeding from trach site. Heparin was started at low fixed rate for pump patency in setting of ICH. Concern with increased  clotting off crrt so will start to titrate heparin to a low goal of  (~0.15-0.2 heparin levels).  Hep lvl slightly above goal 0.22 - no issues per rn  Goal of Therapy:  HL 0.15-0.2  Monitor platelets by anticoagulation protocol: Yes   Plan:  Continue heparin 850 units/hr - though slightly above goal, range is so small likely ok to  keep here F/U recheck in am for further dosing  Barth Kirks, PharmD, BCPS, BCCCP Clinical Pharmacist [phone]  Please check AMION for all [facility] numbers  02/20/2020 7:33 PM

## 2020-02-20 NOTE — Progress Notes (Signed)
NAME:  Alisha Stephenson, MRN:  [id number], DOB:  [birth year], LOS: 55 ADMISSION DATE:  01/24/2020, CONSULTATION DATE:  8/3 REFERRING MD:  Alisha Stephenson, CHIEF COMPLAINT:  Dyspnea   Brief History   54 y/o female admitted on 8/2 with severe acute respiratory failure with hypoxemia due to COVID 19 pneumonia.  She developed symptoms 1 week prior to admission.  Past Medical History  DM2 Diverticulitis Gallstones Ovarian cyst NAFLD Asthma  Significant Hospital Events   8/2 admit 8/3 ICU transfer, intubated 8/4 prone, paralyze 8/9 significant desaturations today 8/9 VV ECMO cannulation 8/13 ICH 8/30 trach  Consults:  PCCM ECMO team  Procedures:  8/3 ETT > 8/30 8/30 8-0 shiley trach 8/3 PICC >  8/9 LIJ MML 8/9 RIJ [place] 46F  9/8 trialysis 9/10 PEG-J (IR)  Significant Diagnostic Tests:  7/31 CT head > NAICP 7/31 MRI/MRA brain > no acute changes, possibly small aneurysm ACOM 8/13 CT head> multiple areas of ICH   Micro Data:  8/2 blood > NG 8/2 SARS COV 2 > positive 8/4 resp > negative 8/4 urine >  8/12 blood > E. faecalis (pan-sensitive) 8/12 resp: staph aureus> MSSA 8/25 blood>> NG 8/24 BAL> yeast  Antimicrobials:  See fever tab for past abx Current: none  Interim history/subjective:  She denies complaints this morning.  Her husband indicated that she had confusion last night thinking that she was at home.  She has been more aggressive with him and agitated.  Objective   Blood pressure (!) 144/61, pulse 93, temperature (!) 97.4 F (36.3 C), temperature source Oral, resp. rate (!) 26, height _0  (1.626 m), weight 110.8 kg, SpO2 92 %.    Vent Mode: PCV FiO2 (%):  [40 %] 40 % Set Rate:  [15 bmp] 15 bmp Vt Set:  [350 mL] 350 mL PEEP:  [10 cmH20] 10 cmH20 Plateau Pressure:  [14 cmH20-24 cmH20] 16 cmH20   Intake/Output Summary (Last 24 hours) at 02/20/2020 0704 Last data filed at 02/20/2020 0600 Gross per 24 hour  Intake 2344.21 ml  Output 3021 ml  Net -676.79 ml    Filed Weights   02/18/20 0500 02/19/20 0500 02/20/20 0500  Weight: 109.9 kg 108.6 kg 110.8 kg    Examination:  GEN: Critically ill-appearing woman sitting up in bed, continues to be more interactive HEENT: /AT, eyes anicteric Neck: Trach, RIJ ECMO cannulas CV: Regular rate and rhythm PULM: Improving breath sounds bilaterally, rales.  Tidal volumes 120 cc GI: Obese, soft, nontender.  PEG tube in place. EXT: Edema continues to improve.  Dry gangrene of distal first and second phalanx on the right hand NEURO: Awake and interactive, attempting to talk.  Paretic left upper extremity.  Following commands with physical therapy PSYCH: Motivated, cooperative with exam Derm: No erythema around wounds, gangrenous digits as above  Pressure Injury 02/14/20 Anus Medial Deep Tissue Pressure Injury - Purple or maroon localized area of discolored intact skin or blood-filled blister due to damage of underlying soft tissue from pressure and/or shear. (Active)  02/14/20 1300  Location: Anus  Location Orientation: Medial  Staging: Deep Tissue Pressure Injury - Purple or maroon localized area of discolored intact skin or blood-filled blister due to damage of underlying soft tissue from pressure and/or shear.  Wound Description (Comments):   Present on Admission: No     ABG paO2 71 BMP stable on CRRT CBC stable Blood culture 9/7 NG final Fibrinogen 547>>571>>481>>464>  431 >416>418>436 LDH 489>>401>>376>>326> 321> 303> 315 LA 1.8 Heparin level 0.22 PTT 80  Net - 700  Vent rate 15, pressure control 12, PEEP 10, 40% FiO2 with tidal volumes about 110 cc  CXR personally reviewed: stable lung opacities, devices in place  Resolved Hospital Problem list   MSSA pneumonia Fungal pneumonia- Candida dublinensis Enterococcal bacteremia-recent blood cultures cleared Concern for possible embolic phenomenon- fingers and possibly CVAs were embolic  Assessment & Plan:    Critically ill due to acute  hypoxic and AoC hypercapneic respiratory failure; likely underly OHS.  Requiring VV ECMO support and mechanical ventilation.  ARDS due to COVID 19 pneumonia Tracheostomy in place -Continue VV ECMO support.  Not yet ready for sweep trials. -Continue ultra lung protective ventilation.  Awaiting trach collar trials, but okay with Passy-Muir valve trials to facilitate ongoing rehabilitation. -VAP prevention protocol -Sedation as needed. -Routine tracheostomy care -off all antibiotics; monitoring for development of infections  Elevated LDH, hypo-fibrinogenemia and thrombocytopenia--improved since circuit change. -Con't routine ECMO labs  ICH, multifocal. L-sided weakness.  Critical illness myopathy--improved today. -Continue PT, OT, SLP.  Very motivated to participate. -Her husband has been very involved in her ongoing care and works with her regularly on range of motion and conditioning exercises.  Acute metabolic encephalopathy, improving Sundowning in the evenings -Continue minimizing sedation as much as possible.  Promote appropriate sleep wake cycles, avoid deliria genic medications -Mobility is much as feasible -Continue family visitation as much as possible  Acute renal failure, severe uremia.  Anuric. -Continue CVVHD.  Appreciate nephrology assistance.  Goal net even. -Renally dose medications and avoid nephrotoxic meds -Strict I's/O -Bladder scan every shift to monitor for return of renal function  Shock related to RV strain - now resolved -Continue midodrine 4m 3 times daily  Hyperglycemia > not controlled at all with [place] insulin despite aggressive upward titration.  Assume poor absorption, potentially due to subcutaneous edema.   -Improved on IV; when euvolemic may be able to tolerate Azure insulin. Will attempt when back on regular TF dose  Best practice:  Diet: tube feeding Pain/Anxiety/Delirium protocol (if indicated): as above VAP protocol (if indicated): yes DVT  prophylaxis: SCDs , heparin fixed dose GI prophylaxis: pantoprazole Glucose control:  insulin infusion  Mobility: bed rest Code Status: limited Family Communication: husband VLynnae Sandhoffupdated at bedside today Disposition: ICU  This patient is critically ill with multiple organ system failure which requires frequent high complexity decision making, assessment, support, evaluation, and titration of therapies. This was completed through the application of advanced monitoring technologies and extensive interpretation of multiple databases. During this encounter critical care time was devoted to patient care services described in this note for 60 minutes.  LJulian Hy MD 02/20/20 9:45 PM [facility]

## 2020-02-20 NOTE — Progress Notes (Signed)
Chaplain stopped by to check on patient and her husband. Chaplain listened to husband and offered emotional support. Will follow up as needed.    02/20/20 1700  Clinical Encounter Type  Visited With Family  Visit Type Follow-up  Consult/Referral To Chaplain

## 2020-02-21 ENCOUNTER — Inpatient Hospital Stay (HOSPITAL_COMMUNITY): Payer: PRIVATE HEALTH INSURANCE

## 2020-02-21 DIAGNOSIS — I959 Hypotension, unspecified: Secondary | ICD-10-CM

## 2020-02-21 LAB — CBC
HCT: 28.3 % — ABNORMAL LOW (ref 36.0–46.0)
HCT: 28.5 % — ABNORMAL LOW (ref 36.0–46.0)
Hemoglobin: 8.5 g/dL — ABNORMAL LOW (ref 12.0–15.0)
Hemoglobin: 8.7 g/dL — ABNORMAL LOW (ref 12.0–15.0)
MCH: 32.3 pg (ref 26.0–34.0)
MCH: 33.2 pg (ref 26.0–34.0)
MCHC: 30 g/dL (ref 30.0–36.0)
MCHC: 30.5 g/dL (ref 30.0–36.0)
MCV: 107.6 fL — ABNORMAL HIGH (ref 80.0–100.0)
MCV: 108.8 fL — ABNORMAL HIGH (ref 80.0–100.0)
Platelets: 115 10*3/uL — ABNORMAL LOW (ref 150–400)
Platelets: 108 10*3/uL — ABNORMAL LOW (ref 150–400)
RBC: 2.63 MIL/uL — ABNORMAL LOW (ref 3.87–5.11)
RBC: 2.62 MIL/uL — ABNORMAL LOW (ref 3.87–5.11)
RDW: 23.9 % — ABNORMAL HIGH (ref 11.5–15.5)
RDW: 23.6 % — ABNORMAL HIGH (ref 11.5–15.5)
WBC: 8.9 10*3/uL (ref 4.0–10.5)
WBC: 9.3 10*3/uL (ref 4.0–10.5)
nRBC: 1.6 % — ABNORMAL HIGH (ref 0.0–0.2)
nRBC: 1.9 % — ABNORMAL HIGH (ref 0.0–0.2)

## 2020-02-21 LAB — GLUCOSE, CAPILLARY
Glucose-Capillary: 171 mg/dL — ABNORMAL HIGH (ref 70–99)
Glucose-Capillary: 176 mg/dL — ABNORMAL HIGH (ref 70–99)
Glucose-Capillary: 173 mg/dL — ABNORMAL HIGH (ref 70–99)
Glucose-Capillary: 180 mg/dL — ABNORMAL HIGH (ref 70–99)
Glucose-Capillary: 192 mg/dL — ABNORMAL HIGH (ref 70–99)
Glucose-Capillary: 192 mg/dL — ABNORMAL HIGH (ref 70–99)
Glucose-Capillary: 200 mg/dL — ABNORMAL HIGH (ref 70–99)
Glucose-Capillary: 192 mg/dL — ABNORMAL HIGH (ref 70–99)
Glucose-Capillary: 195 mg/dL — ABNORMAL HIGH (ref 70–99)
Glucose-Capillary: 211 mg/dL — ABNORMAL HIGH (ref 70–99)
Glucose-Capillary: 177 mg/dL — ABNORMAL HIGH (ref 70–99)
Glucose-Capillary: 182 mg/dL — ABNORMAL HIGH (ref 70–99)
Glucose-Capillary: 163 mg/dL — ABNORMAL HIGH (ref 70–99)
Glucose-Capillary: 148 mg/dL — ABNORMAL HIGH (ref 70–99)
Glucose-Capillary: 223 mg/dL — ABNORMAL HIGH (ref 70–99)
Glucose-Capillary: 202 mg/dL — ABNORMAL HIGH (ref 70–99)

## 2020-02-21 LAB — LACTIC ACID, PLASMA
Lactic Acid, Venous: 1.5 mmol/L (ref 0.5–1.9)
Lactic Acid, Venous: 2 mmol/L (ref 0.5–1.9)

## 2020-02-21 LAB — POCT I-STAT 7, (LYTES, BLD GAS, ICA,H+H)
Acid-Base Excess: 1 mmol/L (ref 0.0–2.0)
Acid-Base Excess: 0 mmol/L (ref 0.0–2.0)
Acid-Base Excess: 0 mmol/L (ref 0.0–2.0)
Acid-Base Excess: 0 mmol/L (ref 0.0–2.0)
Acid-Base Excess: 0 mmol/L (ref 0.0–2.0)
Acid-Base Excess: 1 mmol/L (ref 0.0–2.0)
Bicarbonate: 26.6 mmol/L (ref 20.0–28.0)
Bicarbonate: 26.8 mmol/L (ref 20.0–28.0)
Bicarbonate: 26.4 mmol/L (ref 20.0–28.0)
Bicarbonate: 26.8 mmol/L (ref 20.0–28.0)
Bicarbonate: 26.5 mmol/L (ref 20.0–28.0)
Bicarbonate: 27 mmol/L (ref 20.0–28.0)
Calcium, Ion: 1.18 mmol/L (ref 1.15–1.40)
Calcium, Ion: 1.19 mmol/L (ref 1.15–1.40)
Calcium, Ion: 1.19 mmol/L (ref 1.15–1.40)
Calcium, Ion: 1.2 mmol/L (ref 1.15–1.40)
Calcium, Ion: 1.2 mmol/L (ref 1.15–1.40)
Calcium, Ion: 1.19 mmol/L (ref 1.15–1.40)
HCT: 27 % — ABNORMAL LOW (ref 36.0–46.0)
HCT: 28 % — ABNORMAL LOW (ref 36.0–46.0)
HCT: 28 % — ABNORMAL LOW (ref 36.0–46.0)
HCT: 27 % — ABNORMAL LOW (ref 36.0–46.0)
HCT: 26 % — ABNORMAL LOW (ref 36.0–46.0)
HCT: 26 % — ABNORMAL LOW (ref 36.0–46.0)
Hemoglobin: 9.2 g/dL — ABNORMAL LOW (ref 12.0–15.0)
Hemoglobin: 9.5 g/dL — ABNORMAL LOW (ref 12.0–15.0)
Hemoglobin: 9.5 g/dL — ABNORMAL LOW (ref 12.0–15.0)
Hemoglobin: 9.2 g/dL — ABNORMAL LOW (ref 12.0–15.0)
Hemoglobin: 8.8 g/dL — ABNORMAL LOW (ref 12.0–15.0)
Hemoglobin: 8.8 g/dL — ABNORMAL LOW (ref 12.0–15.0)
O2 Saturation: 93 %
O2 Saturation: 87 %
O2 Saturation: 88 %
O2 Saturation: 90 %
O2 Saturation: 88 %
O2 Saturation: 84 %
Patient temperature: 97.8
Patient temperature: 36.6
Patient temperature: 97.8
Patient temperature: 36.6
Patient temperature: 36.6
Patient temperature: 97.8
Potassium: 3.9 mmol/L (ref 3.5–5.1)
Potassium: 4.3 mmol/L (ref 3.5–5.1)
Potassium: 4.5 mmol/L (ref 3.5–5.1)
Potassium: 4.2 mmol/L (ref 3.5–5.1)
Potassium: 4.4 mmol/L (ref 3.5–5.1)
Potassium: 4.6 mmol/L (ref 3.5–5.1)
Sodium: 137 mmol/L (ref 135–145)
Sodium: 138 mmol/L (ref 135–145)
Sodium: 137 mmol/L (ref 135–145)
Sodium: 138 mmol/L (ref 135–145)
Sodium: 139 mmol/L (ref 135–145)
Sodium: 137 mmol/L (ref 135–145)
TCO2: 28 mmol/L (ref 22–32)
TCO2: 28 mmol/L (ref 22–32)
TCO2: 28 mmol/L (ref 22–32)
TCO2: 28 mmol/L (ref 22–32)
TCO2: 28 mmol/L (ref 22–32)
TCO2: 28 mmol/L (ref 22–32)
pCO2 arterial: 45.7 mmHg (ref 32.0–48.0)
pCO2 arterial: 52.9 mmHg — ABNORMAL HIGH (ref 32.0–48.0)
pCO2 arterial: 49.7 mmHg — ABNORMAL HIGH (ref 32.0–48.0)
pCO2 arterial: 50.7 mmHg — ABNORMAL HIGH (ref 32.0–48.0)
pCO2 arterial: 48.9 mmHg — ABNORMAL HIGH (ref 32.0–48.0)
pCO2 arterial: 50 mmHg — ABNORMAL HIGH (ref 32.0–48.0)
pH, Arterial: 7.371 (ref 7.350–7.450)
pH, Arterial: 7.311 — ABNORMAL LOW (ref 7.350–7.450)
pH, Arterial: 7.332 — ABNORMAL LOW (ref 7.350–7.450)
pH, Arterial: 7.329 — ABNORMAL LOW (ref 7.350–7.450)
pH, Arterial: 7.341 — ABNORMAL LOW (ref 7.350–7.450)
pH, Arterial: 7.338 — ABNORMAL LOW (ref 7.350–7.450)
pO2, Arterial: 69 mmHg — ABNORMAL LOW (ref 83.0–108.0)
pO2, Arterial: 58 mmHg — ABNORMAL LOW (ref 83.0–108.0)
pO2, Arterial: 57 mmHg — ABNORMAL LOW (ref 83.0–108.0)
pO2, Arterial: 63 mmHg — ABNORMAL LOW (ref 83.0–108.0)
pO2, Arterial: 57 mmHg — ABNORMAL LOW (ref 83.0–108.0)
pO2, Arterial: 52 mmHg — ABNORMAL LOW (ref 83.0–108.0)

## 2020-02-21 LAB — RENAL FUNCTION PANEL
Albumin: 2.7 g/dL — ABNORMAL LOW (ref 3.5–5.0)
Albumin: 2.9 g/dL — ABNORMAL LOW (ref 3.5–5.0)
Anion gap: 9 (ref 5–15)
Anion gap: 9 (ref 5–15)
BUN: 27 mg/dL — ABNORMAL HIGH (ref 6–20)
BUN: 27 mg/dL — ABNORMAL HIGH (ref 6–20)
CO2: 24 mmol/L (ref 22–32)
CO2: 25 mmol/L (ref 22–32)
Calcium: 8.3 mg/dL — ABNORMAL LOW (ref 8.9–10.3)
Calcium: 8.5 mg/dL — ABNORMAL LOW (ref 8.9–10.3)
Chloride: 101 mmol/L (ref 98–111)
Chloride: 102 mmol/L (ref 98–111)
Creatinine, Ser: 1.03 mg/dL — ABNORMAL HIGH (ref 0.44–1.00)
Creatinine, Ser: 0.99 mg/dL (ref 0.44–1.00)
GFR calc Af Amer: >60 mL/min (ref >60)
GFR calc Af Amer: >60 mL/min (ref >60)
GFR calc non Af Amer: >60 mL/min (ref >60)
GFR calc non Af Amer: >60 mL/min (ref >60)
Glucose, Bld: 184 mg/dL — ABNORMAL HIGH (ref 70–99)
Glucose, Bld: 166 mg/dL — ABNORMAL HIGH (ref 70–99)
Phosphorus: 3.1 mg/dL (ref 2.5–4.6)
Phosphorus: 2.2 mg/dL — ABNORMAL LOW (ref 2.5–4.6)
Potassium: 4.1 mmol/L (ref 3.5–5.1)
Potassium: 4.1 mmol/L (ref 3.5–5.1)
Sodium: 134 mmol/L — ABNORMAL LOW (ref 135–145)
Sodium: 136 mmol/L (ref 135–145)

## 2020-02-21 LAB — BPAM RBC
Blood Product Expiration Date: 202110102359
Blood Product Expiration Date: 202110102359
Blood Product Expiration Date: 202110102359
Blood Product Expiration Date: 202110102359
Blood Product Expiration Date: 202110142359
Blood Product Expiration Date: 202110142359
Blood Product Expiration Date: 202110142359
ISSUE DATE / TIME: 202109120645
Unit Type and Rh: 5100
Unit Type and Rh: 5100
Unit Type and Rh: 5100
Unit Type and Rh: 5100
Unit Type and Rh: 5100
Unit Type and Rh: 5100
Unit Type and Rh: 5100

## 2020-02-21 LAB — TYPE AND SCREEN
ABO/RH(D): O POS
Antibody Screen: NEGATIVE
Unit division: 0
Unit division: 0
Unit division: 0
Unit division: 0
Unit division: 0
Unit division: 0
Unit division: 0

## 2020-02-21 LAB — HEPARIN LEVEL (UNFRACTIONATED)
Heparin Unfractionated: 0.16 IU/mL — ABNORMAL LOW (ref 0.30–0.70)
Heparin Unfractionated: 0.23 IU/mL — ABNORMAL LOW (ref 0.30–0.70)

## 2020-02-21 LAB — MAGNESIUM: Magnesium: 2.6 mg/dL — ABNORMAL HIGH (ref 1.7–2.4)

## 2020-02-21 LAB — FIBRINOGEN: Fibrinogen: 415 mg/dL (ref 210–475)

## 2020-02-21 LAB — APTT
aPTT: 69 seconds — ABNORMAL HIGH (ref 24–36)
aPTT: 94 seconds — ABNORMAL HIGH (ref 24–36)

## 2020-02-21 LAB — LACTATE DEHYDROGENASE: LDH: 383 U/L — ABNORMAL HIGH (ref 98–192)

## 2020-02-21 MED ORDER — INSULIN ASPART 100 UNIT/ML ~~LOC~~ SOLN
0.0000 [IU] | SUBCUTANEOUS | Status: DC
  Administered 2020-02-21 (×2): 5 [IU] via SUBCUTANEOUS
  Administered 2020-02-21: 2 [IU] via SUBCUTANEOUS
  Administered 2020-02-22: 3 [IU] via SUBCUTANEOUS
  Administered 2020-02-22 (×4): 5 [IU] via SUBCUTANEOUS
  Administered 2020-02-22: 3 [IU] via SUBCUTANEOUS
  Administered 2020-02-23 (×4): 5 [IU] via SUBCUTANEOUS
  Administered 2020-02-23 – 2020-02-24 (×3): 8 [IU] via SUBCUTANEOUS
  Administered 2020-02-24 (×5): 5 [IU] via SUBCUTANEOUS
  Administered 2020-02-25: 2 [IU] via SUBCUTANEOUS
  Administered 2020-02-25 (×2): 5 [IU] via SUBCUTANEOUS
  Administered 2020-02-25: 3 [IU] via SUBCUTANEOUS
  Administered 2020-02-25 (×2): 5 [IU] via SUBCUTANEOUS
  Administered 2020-02-26: 3 [IU] via SUBCUTANEOUS
  Administered 2020-02-26: 5 [IU] via SUBCUTANEOUS
  Administered 2020-02-26 (×3): 8 [IU] via SUBCUTANEOUS
  Administered 2020-02-26 – 2020-02-27 (×2): 5 [IU] via SUBCUTANEOUS
  Administered 2020-02-27: 8 [IU] via SUBCUTANEOUS
  Administered 2020-02-27: 5 [IU] via SUBCUTANEOUS
  Administered 2020-02-27: 3 [IU] via SUBCUTANEOUS
  Administered 2020-02-27 – 2020-02-28 (×3): 8 [IU] via SUBCUTANEOUS
  Administered 2020-02-28: 5 [IU] via SUBCUTANEOUS
  Administered 2020-02-28: 2 [IU] via SUBCUTANEOUS
  Administered 2020-02-28 – 2020-02-29 (×4): 5 [IU] via SUBCUTANEOUS
  Administered 2020-02-29: 3 [IU] via SUBCUTANEOUS
  Administered 2020-02-29: 5 [IU] via SUBCUTANEOUS
  Administered 2020-02-29 – 2020-03-01 (×5): 3 [IU] via SUBCUTANEOUS
  Administered 2020-03-01: 2 [IU] via SUBCUTANEOUS
  Administered 2020-03-01: 8 [IU] via SUBCUTANEOUS
  Administered 2020-03-01: 3 [IU] via SUBCUTANEOUS
  Administered 2020-03-01: 8 [IU] via SUBCUTANEOUS
  Administered 2020-03-02 (×4): 3 [IU] via SUBCUTANEOUS
  Administered 2020-03-02: 5 [IU] via SUBCUTANEOUS
  Administered 2020-03-03: 2 [IU] via SUBCUTANEOUS
  Administered 2020-03-03: 5 [IU] via SUBCUTANEOUS
  Administered 2020-03-03 (×2): 3 [IU] via SUBCUTANEOUS
  Administered 2020-03-03 – 2020-03-04 (×4): 5 [IU] via SUBCUTANEOUS
  Administered 2020-03-04: 8 [IU] via SUBCUTANEOUS

## 2020-02-21 MED ORDER — INSULIN ASPART 100 UNIT/ML ~~LOC~~ SOLN: 5.0000 [IU] | SUBCUTANEOUS | Status: DC

## 2020-02-21 MED ORDER — PNEUMOCOCCAL VAC POLYVALENT 25 MCG/0.5ML IJ INJ
0.5000 mL | INJECTION | INTRAMUSCULAR | Status: DC
  Filled 2020-02-21: qty 0.5

## 2020-02-21 MED ORDER — INSULIN ASPART 100 UNIT/ML ~~LOC~~ SOLN
6.0000 [IU] | SUBCUTANEOUS | Status: DC
  Administered 2020-02-21 – 2020-02-23 (×11): 6 [IU] via SUBCUTANEOUS

## 2020-02-21 MED ORDER — INFLUENZA VAC SPLIT QUAD 0.5 ML IM SUSY
0.5000 mL | PREFILLED_SYRINGE | INTRAMUSCULAR | Status: AC
  Administered 2020-02-24: 0.5 mL via INTRAMUSCULAR
  Filled 2020-02-21: qty 0.5

## 2020-02-21 MED ORDER — SODIUM PHOSPHATES 45 MMOLE/15ML IV SOLN
20.0000 mmol | Freq: Once | INTRAVENOUS | Status: AC
  Administered 2020-02-21: 20 mmol via INTRAVENOUS
  Filled 2020-02-21: qty 6.67

## 2020-02-21 MED ORDER — INSULIN DETEMIR 100 UNIT/ML ~~LOC~~ SOLN
30.0000 [IU] | Freq: Two times a day (BID) | SUBCUTANEOUS | Status: DC
  Administered 2020-02-21 – 2020-02-22 (×4): 30 [IU] via SUBCUTANEOUS
  Filled 2020-02-21 (×6): qty 0.3

## 2020-02-21 NOTE — Progress Notes (Signed)
Patient ID: Alisha Stephenson, female   DOB: [birth year], 54 y.o.   MRN: [id number]    Advanced Heart Failure Rounding Note   Subjective:    - 8/2 COVID + test - 8/9 Cannulated for VV ECMO - 8/13 with several areas of intracranial hemorrhage. Bival stopped.  - 8/14 CT no change in [place]. Increased edema - 8/16 Extubated - 8/16 Head CT stable bleed - 8/22 CVVHD started - 8/24 reintubated - 8/24 circuit changed - 8/30 trach - 9/7  Switched to iHD - 02/15/20 Switched back to CVVHD - 9/10 GJ tube placed  Remains awake on vent via trach. Following commands and able to move more, working with PT/OT. Saw Speech today for PM valve  Remains on CVVHD. Remains anuric. Pulling 50/hr. Weight up 5 pounds (?) remains on heparin.   Off pressors   CXR diffuse infiltrates. No change today. Personally reviewed  ECMO  Flow 4.5 L RPM 4100 Sweep10.5  Labs: 7.35/49/47/81% 40% FiO2 Hgb8.9 PLT 113k LDH 315 -> 374 Lactic acid1.8   Objective:   Weight Range:  Vital Signs:   Temp:  [98.7 F (37.1 C)] 98.7 F (37.1 C) (09/14 2000) Pulse Rate:  [89-106] 98 (09/15 0743) Resp:  [22-35] 35 (09/15 0743) BP: (103-104)/(42-45) 104/42 (09/15 0743) SpO2:  [88 %-100 %] 90 % (09/15 0743) FiO2 (%):  [40 %] 40 % (09/15 0743) Weight:  [115.9 kg] 115.9 kg (09/15 0500) Last BM Date: 02/20/20  Weight change: Filed Weights   02/19/20 0500 02/20/20 0500 02/21/20 0500  Weight: 108.6 kg 110.8 kg 115.9 kg    Intake/Output:   Intake/Output Summary (Last 24 hours) at 02/21/2020 0829 Last data filed at 02/21/2020 0800 Gross per 24 hour  Intake 2605.4 ml  Output 2702 ml  Net -96.6 ml     Physical Exam: General:  Awake on vent through trach. Following commands HEENT: normal Neck: supple. RIJ ECMO. LIJ trialysis Carotids 2+ bilat; no bruits. No lymphadenopathy or thryomegaly appreciated. Cor: PMI nondisplaced. Regular rate & rhythm. No rubs, gallops or murmurs. Lungs: clear Abdomen: obese soft,  nontender, nondistended. No hepatosplenomegaly. No bruits or masses. Good bowel sounds. Extremities: no cyanosis, clubbing, rash, tr edema Neuro: awake following commands weka  Telemetry: Sinus 90-105 Personally reviewed   Labs: Basic Metabolic Panel: Recent Labs  Lab 02/17/20 0218 02/17/20 0219 02/18/20 0400 02/18/20 0416 02/19/20 0335 02/19/20 0350 02/19/20 1641 02/19/20 1641 02/19/20 1700 02/19/20 1700 02/19/20 1756 02/20/20 0232 02/20/20 0233 02/20/20 0552 02/20/20 1545 02/20/20 1545 02/20/20 1618 02/20/20 1628 02/20/20 1942 02/21/20 0257 02/21/20 0535  NA  --    < > 137   < > 137   < > 134*   < > 136   < >   < >  --  135   < > 136   < > 135 137 137 134* 137  K  --    < > 3.7   < > 3.7   < > 4.1   < > 4.1   < >   < >  --  3.6   < > 3.9   < > 4.0 4.1 4.3 4.1 3.9  CL  --    < > 103   < > 103   < > 99   < > 101  --   --   --  100  --  101  --  100  --   --  101  --   CO2  --    < >  25   < > 26   < > 25   < > 26  --   --   --  25  --  24  --  24  --   --  24  --   GLUCOSE  --    < > 167*   < > 176*   < > 188*   < > 187*  --   --   --  172*  --  188*  --  197*  --   --  184*  --   BUN  --    < > 14   < > 15   < > 18   < > 18  --   --   --  20  --  21*  --  22*  --   --  27*  --   CREATININE  --    < > 1.12*   < > 0.93   < > 0.86   < > 0.91  --   --   --  0.86  --  0.98  --  0.94  --   --  1.03*  --   CALCIUM  --    < > 8.2*   < > 8.2*   < > 8.1*   < > 8.1*   < >  --   --  8.3*   < > 8.4*  --  8.5*  --   --  8.3*  --   MG 2.5*  --  2.6*  --  2.5*  --   --   --   --   --   --  2.5*  --   --   --   --   --   --   --  2.6*  --   PHOS  --    < > 2.2*   < > 1.3*  --  3.5  --   --   --   --   --  1.9*  --  3.8  --   --   --   --  3.1  --    < > = values in this interval not displayed.    Liver Function Tests: Recent Labs  Lab 02/19/20 0335 02/19/20 1641 02/20/20 0233 02/20/20 1545 02/21/20 0257  ALBUMIN 2.4* 2.7* 2.5* 2.7* 2.7*   No results for input(s): LIPASE, AMYLASE  in the last 168 hours. No results for input(s): AMMONIA in the last 168 hours.  CBC: Recent Labs  Lab 02/19/20 0335 02/19/20 0350 02/19/20 1700 02/19/20 1756 02/20/20 0232 02/20/20 0552 02/20/20 1618 02/20/20 1628 02/20/20 1942 02/21/20 0257 02/21/20 0535  WBC 6.9  --  9.8  --  7.5  --  10.2  --   --  8.9  --   HGB 8.7*   < > 9.2*   < > 8.9*   < > 9.6* 10.2* 10.2* 8.5* 9.2*  HCT 27.8*   < > 30.9*   < > 29.4*   < > 31.2* 30.0* 30.0* 28.3* 27.0*  MCV 107.8*  --  108.0*  --  107.7*  --  108.3*  --   --  107.6*  --   PLT 117*  --  126*  --  113*  --  121*  --   --  115*  --    < > = values in this interval not displayed.    Cardiac Enzymes: No results for input(s): CKTOTAL, CKMB, CKMBINDEX,  TROPONINI in the last 168 hours.  BNP: BNP (last 3 results) No results for input(s): BNP in the last 8760 hours.  ProBNP (last 3 results) No results for input(s): PROBNP in the last 8760 hours.    Other results:  Imaging: DG CHEST PORT 1 VIEW  Result Date: 02/21/2020 CLINICAL DATA:  Respiratory failure. EXAM: PORTABLE CHEST 1 VIEW COMPARISON:  February 20, 2020. FINDINGS: Stable cardiomegaly. Tracheostomy is unchanged in position. ECMO device is unchanged. Left internal jugular catheter is unchanged. Stable bilateral lung opacities are noted consistent with multifocal pneumonia. Small pleural effusions may be present. No pneumothorax is noted. Bony thorax is unremarkable. IMPRESSION: Stable support apparatus. Stable bilateral lung opacities consistent with multifocal pneumonia. Electronically Signed   By: Marijo Conception M.D.   On: 02/21/2020 08:25   DG CHEST PORT 1 VIEW  Result Date: 02/20/2020 CLINICAL DATA:  ECMO, respiratory failure, COVID EXAM: PORTABLE CHEST 1 VIEW COMPARISON:  02/19/2020 FINDINGS: Support devices are stable. Severe diffuse bilateral airspace disease. Mild cardiomegaly. No change since prior study. IMPRESSION: No interval change. Electronically Signed   By: Rolm Baptise M.D.   On: 02/20/2020 08:15     Medications:     Scheduled Medications: . amiodarone  200 mg Per Tube Daily  . vitamin C  500 mg Per Tube Daily  . chlorhexidine gluconate (MEDLINE KIT)  15 mL Mouth Rinse BID  . Chlorhexidine Gluconate Cloth  6 each Topical Q0600  . clonazePAM  1 mg Per Tube Q6H  . collagenase   Topical Daily  . docusate  100 mg Per Tube BID  . feeding supplement (PROSource TF)  45 mL Per Tube QID  . Gerhardt's butt cream   Topical BID  . [START ON 02/22/2020] influenza vac split quadrivalent PF  0.5 mL Intramuscular Tomorrow-1000  . linagliptin  5 mg Per Tube Daily  . lipase/protease/amylase)  20,880 Units Per Tube Once   And  . sodium bicarbonate  650 mg Per Tube Once  . mouth rinse  15 mL Mouth Rinse 10 times per day  . melatonin  9 mg Per Tube QHS  . midodrine  10 mg Per Tube TID  . multivitamin  1 tablet Per Tube QHS  . oxyCODONE  10 mg Per Tube Q6H  . pantoprazole (PROTONIX) IV  40 mg Intravenous BID  . [START ON 02/22/2020] pneumococcal 23 valent vaccine  0.5 mL Intramuscular Tomorrow-1000  . polyethylene glycol  17 g Per Tube Daily  . QUEtiapine  100 mg Per Tube QHS  . sennosides  5 mL Per Tube BID  . zinc sulfate  220 mg Per Tube Daily    Infusions: .  prismasol BGK 4/2.5 400 mL/hr at 02/20/20 1431  .  prismasol BGK 4/2.5 200 mL/hr at 02/21/20 0101  . sodium chloride 500 mL (02/11/20 2215)  . sodium chloride 10 mL/hr at 02/21/20 0800  . sodium chloride    . sodium chloride    . albumin human Stopped (02/20/20 2230)  . albumin human Stopped (02/06/20 2000)  . feeding supplement (PIVOT 1.5 CAL) 65 mL/hr at 02/21/20 0700  . heparin 10,000 units/ 20 mL infusion syringe 500 Units/hr (02/21/20 0215)  . heparin 850 Units/hr (02/21/20 0800)  . insulin 6 mL/hr at 02/21/20 0800  . norepinephrine (LEVOPHED) Adult infusion Stopped (02/17/20 1948)  . phenylephrine (NEO-SYNEPHRINE) Adult infusion Stopped (02/13/20 0155)  . prismasol BGK 4/2.5 1,500  mL/hr at 02/21/20 0539    PRN Medications: sodium chloride, sodium chloride, sodium  chloride, sodium chloride, acetaminophen (TYLENOL) oral liquid 160 mg/5 mL, albumin human, albuterol, alteplase, alum & mag hydroxide-simeth, dextrose, fentaNYL (SUBLIMAZE) injection, guaiFENesin-dextromethorphan, heparin, heparin, heparin, hydrALAZINE, HYDROmorphone (DILAUDID) injection, hydrOXYzine, lidocaine (PF), lidocaine-prilocaine, ondansetron **OR** ondansetron (ZOFRAN) IV, pentafluoroprop-tetrafluoroeth   Assessment/Plan:   1. Acute hypoxic/hypercapneic respiratory failure in setting of severe COVID PNA/ARDS -> VV ECMO - admit 8/2 - intubation 8/3 - has received actmera (compelted 8/2), remdesivir (completed 8/6) and steroids - Cannulated for VV ECMO on 8/9 - Extubated 8/16. Reintubated 8/24 - s/p trach 8/30. - Circuit changed 8/24 due to concern for hemolysis and worsening oxygenation. - CVVHD started 8/22 for volume removal and uremia. Remains anuric. CXR worse on 9/9 so switched back to CVVHD.  Keep at -60 today.  - Off IV sedation. Continue melatonin and seroquel for sleep at night. Can adjust to help with restlessness and sundowning at night - Off pressors and on midodrine 10 tid - CXR stable. Lung function seems to be improving slowly. Wean sweep as tolerted - Main issues remain to be poor lung recovery, critical care myopathy and AKI. Continue slow wean as tolerated. Need to prioritize ECMO wean then vent wean.  - Continue heparin with level 0.2-0.3 Discussed dosing with PharmD personally.  2. Enterococcus sepsis - Finished high-dose zosyn on 9/7 per ID - Eraxis added on 8/26 with yeast in BAL 8/24 (ended 9/2)  - Cultures repeated with increasing pressor demands - F/u cx 9/7 Remain NGTD.  - No change  3. Intracranial hemorrhage - ? Septic emboli - repeat head CT on 8/14 with stable bleeds but increased edema - neurology has seen. Suspect significant long-term injury sustained. Will  follow commands. Appears to have dense LUE weakness and possibly LLE - repeat head CT stable 8/16 - tolerating heparin  4. Thrombocytopenia - PLTs 110-120k   - Continue to follow. Stable   5. Morbid obesity - Body mass index is Body mass index is 43.86 kg/m.  6. Poorly controlled DM2 - HgBA1c 10.7 - On insulin gtt  7. PAF - intermittent episodes. Last 8/26 - Now on po. Quiescent  8. AKI/azotemia - CVVHD started on 8/22 - Down 70 pounds.  - transitioned to [facility] on 9/7 - switched back to CVVHD for better volume removal on 9/9. - new catheter placed 9/9 - will eventually need tunneled access - Keep even to -50.   9. Emesis - Cor-trak out.  - Has GJ tube now, slowly increase TFs.   10. Sacral decub  - Wound care following  11. Necrotic fingers - VVS following - suspect will auto amputate  12. Anemia - Transfuse hgb < 8 - 8.9 today  CRITICAL CARE Performed by: Glori Bickers  Total critical care time: 40 minutes  Critical care time was exclusive of separately billable procedures and treating other patients.  Critical care was necessary to treat or prevent imminent or life-threatening deterioration.  Critical care was time spent personally by me (independent of midlevel providers or residents) on the following activities: development of treatment plan with patient and/or surrogate as well as nursing, discussions with consultants, evaluation of patient's response to treatment, examination of patient, obtaining history from patient or surrogate, ordering and performing treatments and interventions, ordering and review of laboratory studies, ordering and review of radiographic studies, pulse oximetry and re-evaluation of patient's condition.   Length of Stay: [place]  MD 02/21/2020, 8:29 AM  Advanced Heart Failure Team Pager [phone] (M-F; 7a - 4p)  Please contact [facility] for night-coverage after hours (4p -7a ) and weekends on  [email or web address]

## 2020-02-21 NOTE — Procedures (Signed)
Extracorporeal support note   ECLS support day: 37 (cannulated 01/09/2020) Indication: Covid 19 pneumonia/ARDS  Configuration: VV  Drainage cannula: right IJ [place]  Return cannula: same  Pump speed: 4100 Pump flow: 4.7 Pump used: Centrimag  Oxygenator: Quadrox O2 blender: 100% Sweep gas: 10  ABG    Component Value Date/Time   PHART 7.371 02/21/2020 0535   PCO2ART 45.7 02/21/2020 0535   PO2ART 69 (L) 02/21/2020 0535   HCO3 26.6 02/21/2020 0535   TCO2 28 02/21/2020 0535   ACIDBASEDEF 1.0 02/15/2020 1947   O2SAT 93.0 02/21/2020 0535    Circuit check: minimal visible clot- only fibrin in corners  Anticoagulant: Continue heparin Anticoagulation targets: goal heparin level 0.2  Changes in support: Con't aggressive PT, OT. Con't PMV trials with speech. Ok to decrease PEEP to 5.   Anticipated goals/duration of support: bridge to recovery  Julian Hy, MD 02/21/20 8:49 AM [facility]

## 2020-02-21 NOTE — Progress Notes (Signed)
Hopeful to see pt for inline PMSV at some point today. RT and SLP have some scheduling conflicts but we will do our best to visit pt if possible.  Herbie Baltimore, [place]  Ac[facility] Services Pager [phone] Office [phone]

## 2020-02-21 NOTE — Progress Notes (Signed)
[place] for Heparin Indication: ECMO  Allergies  Allergen Reactions  . Sulfa Antibiotics Rash and Other (See Comments)    Patient Measurements: Height: 5\' 4"  (162.6 cm) Weight: 115.9 kg (255 lb 8.2 oz) IBW/kg (Calculated) : 54.7 Heparin Dosing Weight: 87 kg  Vital Signs: Temp: 97.8 F (36.6 C) (09/15 1500) Temp Source: Oral (09/15 1500) BP: 103/37 (09/15 1555) Pulse Rate: 95 (09/15 1600)  Labs: Recent Labs    02/20/20 0232 02/20/20 1618 02/20/20 1621 02/20/20 1628 02/21/20 0257 02/21/20 0535 02/21/20 1304 02/21/20 1304 02/21/20 1512 02/21/20 1558  HGB  --  9.6*  --    < > 8.5*   < > 9.5*   < > 9.2* 8.7*  HCT  --  31.2*  --    < > 28.3*   < > 28.0*  --  27.0* 28.5*  PLT  --  121*  --   --  115*  --   --   --   --  108*  APTT  --  80*  --   --  69*  --   --   --   --  94*  HEPARINUNFRC   < >  --  0.22*  --  0.16*  --   --   --   --  0.23*  CREATININE  --  0.94  --   --  1.03*  --   --   --   --  0.99   < > = values in this interval not displayed.    Estimated Creatinine Clearance: 80.3 mL/min (by C-G formula based on SCr of 0.99 mg/dL).  Assessment: 54 yo female COVID+ on VV ECMO. Patient started on bivalirudin but was found to have multiple ICH on 8/13 head CT. Bivalirudin was stopped and pt was off anticoagulation at that time. Her ECMO circuit was changed 8/24. Pharmacy asked to start low-fixed rate heparin. Heparin infusion was held last week due to bleeding from trach site. Heparin was started at low fixed rate for pump patency in setting of ICH. Concern with increased  clotting off crrt so will start to titrate heparin to a low goal of  (~0.15-0.2 heparin levels).  Hep lvl slightly high at 0.23.  Goal of Therapy:  HL 0.15-0.2  Monitor platelets by anticoagulation protocol: Yes   Plan:  Decrease heparin 800 units/hr q12h heparin level, CBC   Alanda Slim, PharmD, [place] Clinical Pharmacist Please see AMION for all  Pharmacists' Contact Phone Numbers 02/21/2020, 4:43 PM

## 2020-02-21 NOTE — Progress Notes (Signed)
CRITICAL VALUE ALERT  Critical Value:  Lactic acid 2.0  Date & Time Notied:  1648  Provider Notified: Dr. Ruthann Cancer and Dr. [name] notified.  Orders Received/Actions taken: Continue to monitor closely for s/s infection.   Joellen Jersey, RN

## 2020-02-21 NOTE — Consult Note (Signed)
[place] Nurse wound follow up Patient receiving care in [place]. Assisted with turning by primary RN and ECMO nurse. Wound type: rectal wound, dry gangrene to right fingers 1 & 2 Measurement: na Wound bed: the thumb and index finger of the right hand with dry, black distal tips.  The rectal wound is stable; the flexiseal is no longer in use. Enzymatic debridement to left back and sacral wounds in use.  Will likely need hydrotherapy next week.  I am waiting to see how the back and sacral wounds respond to days of enzymatic therapy. Drainage (amount, consistency, odor)  Periwound: Dressing procedure/placement/frequency: Continue therapy as ordered. Will follow up next week. Val Riles, RN, MSN, CWOCN, CNS-BC, pager [phone]

## 2020-02-21 NOTE — Progress Notes (Signed)
Physical Therapy Treatment Patient Details Name: Alisha Stephenson MRN: [id number] DOB: 1965-09-12 Today's Date: 02/21/2020    History of Present Illness 54 yo admitted 8/2 with hypoxemia due to Covid 19 PNA. 8/3 intubated. 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 CRRT 8/22-9/6, restarted 9/9. Reintubated 8/24 with ECMO circuit changed, 8/30 trach. 9/10 PEG tube. PMHx: DM, diverticulitis, Asthma    PT Comments    Pt pleasant, awake and continuing to show improvement in functional strength and increased AROM of RUE. Pt with noted dorsiflexion 1/5 on bil LE this session and 2-/5 adduction bil hips. Pt performed HEP bil LE and RUE and tilted but increasing lethargy during tilt and only able to tolerate 30 degrees for 10 min. Will continue to progress strength and transfers.   97% SpO2 40% pressure control FiO2 MAP 50-60 during tilt    Follow Up Recommendations  LTACH;Supervision/Assistance - 24 hour     Equipment Recommendations  Other (comment) (TBD)    Recommendations for Other Services       Precautions / Restrictions Precautions Precaution Comments: ECMO with RIJ access anchors at right shoulder and chest, right necrotic thumb and index finger, bruised/necrotic left hand, trach, vent, CRRT, PEG Other Brace: B PRAFO    Mobility  Bed Mobility Overal bed mobility: Needs Assistance             General bed mobility comments: sliding toward HOB +2 assist total, utilized tilt for transition supine <>30 degrees  Transfers                    Ambulation/Gait                 Stairs             Wheelchair Mobility    Modified Rankin (Stroke Patients Only)       Balance Overall balance assessment: Needs assistance                                          Cognition Arousal/Alertness: Awake/alert Behavior During Therapy: WFL for tasks assessed/performed Overall Cognitive Status:  Difficult to assess Area of Impairment: Safety/judgement                       Following Commands: Follows one step commands with increased time;Follows one step commands inconsistently Safety/Judgement: Decreased awareness of deficits;Decreased awareness of safety     General Comments: pt talkative throughout session, following commands, with progression of session to tilt pt with increased lethargy (presume due to pain meds given prior to session)      Exercises General Exercises - Upper Extremity Shoulder Flexion: 10 reps;Supine;Right;AAROM Elbow Flexion: AAROM;Right;Supine;10 reps General Exercises - Lower Extremity Ankle Circles/Pumps: AAROM;Supine;Both;10 reps Heel Slides: AAROM;Both;10 reps;Supine Hip ABduction/ADduction: AAROM;Both;Supine;10 reps    General Comments        Pertinent Vitals/Pain Pain Score: 6  Pain Location: buttocks Pain Descriptors / Indicators: Grimacing;Sore Pain Intervention(s): Limited activity within patient's tolerance;Monitored during session;Repositioned    Home Living                      Prior Function            PT Goals (current goals can now be found in the care plan section) Progress towards PT goals: Progressing toward goals    Frequency  Min 3X/week      PT Plan Current plan remains appropriate    Co-evaluation              AM-PAC PT "6 Clicks" Mobility   Outcome Measure  Help needed turning from your back to your side while in a flat bed without using bedrails?: Total Help needed moving from lying on your back to sitting on the side of a flat bed without using bedrails?: Total Help needed moving to and from a bed to a chair (including a wheelchair)?: Total Help needed standing up from a chair using your arms (e.g., wheelchair or bedside chair)?: Total Help needed to walk in hospital room?: Total Help needed climbing 3-5 steps with a railing? : Total 6 Click Score: 6    End of Session    Activity Tolerance: Patient tolerated treatment well Patient left: in bed;with nursing/sitter in room;with family/visitor present Nurse Communication: Mobility status;Need for lift equipment PT Visit Diagnosis: Other abnormalities of gait and mobility (R26.89);Muscle weakness (generalized) (M62.81)     Time: 6314-9702 PT Time Calculation (min) (ACUTE ONLY): 45 min  Charges:  $Therapeutic Exercise: 23-37 mins $Therapeutic Activity: 8-22 mins                     Tadeusz Stahl P, PT Acute Rehabilitation Services Pager: [phone] Office: [place] Madd[place] Kliewer 02/21/2020, 1:34 PM

## 2020-02-21 NOTE — Progress Notes (Signed)
NAME:  Alisha Stephenson, MRN:  [id number], DOB:  01/10/1966, LOS: 2 ADMISSION DATE:  01/27/2020, CONSULTATION DATE:  8/3 REFERRING MD:  Alfredia Ferguson, CHIEF COMPLAINT:  Dyspnea   Brief History   54 y/o female admitted on 8/2 with severe acute respiratory failure with hypoxemia due to COVID 19 pneumonia.  She developed symptoms 1 week prior to admission.  Past Medical History  DM2 Diverticulitis Gallstones Ovarian cyst NAFLD Asthma  Significant Hospital Events   8/2 admit 8/3 ICU transfer, intubated 8/4 prone, paralyze 8/9 significant desaturations today 8/9 VV ECMO cannulation 8/13 ICH 8/30 trach  Consults:  PCCM ECMO team  Procedures:  8/3 ETT > 8/30 8/30 8-0 shiley trach 8/3 PICC >  8/9 LIJ MML 8/9 RIJ [place] 50F  9/8 trialysis 9/10 PEG-J (IR)  Significant Diagnostic Tests:  7/31 CT head > NAICP 7/31 MRI/MRA brain > no acute changes, possibly small aneurysm ACOM 8/13 CT head> multiple areas of ICH   Micro Data:  8/2 blood > NG 8/2 SARS COV 2 > positive 8/4 resp > negative 8/4 urine >  8/12 blood > E. faecalis (pan-sensitive) 8/12 resp: staph aureus> MSSA 8/25 blood>> NG 8/24 BAL> yeast  Antimicrobials:  See fever tab for past abx Current: none  Interim history/subjective:  *Slept better overnight, worked with PT, SLP.   Objective   Blood pressure (!) 103/45, pulse (!) 101, temperature 98.7 F (37.1 C), temperature source Oral, resp. rate (!) 22, height _0  (1.626 m), weight 115.9 kg, SpO2 90 %.    Vent Mode: PCV FiO2 (%):  [40 %] 40 % Set Rate:  [15 bmp] 15 bmp Vt Set:  [350 mL] 350 mL PEEP:  [10 cmH20] 10 cmH20 Plateau Pressure:  [21 cmH20-28 cmH20] 21 cmH20   Intake/Output Summary (Last 24 hours) at 02/21/2020 0708 Last data filed at 02/21/2020 0700 Gross per 24 hour  Intake 2603.6 ml  Output 2731 ml  Net -127.4 ml   Filed Weights   02/19/20 0500 02/20/20 0500 02/21/20 0500  Weight: 108.6 kg 110.8 kg 115.9 kg    Examination:  GEN: ill  appearing woman sitting up in bed, awake and interactive HEENT: /AT, eyes anicteric, oral mucosa moist. Neck: trach, RIJ ECMO cannulas CV: RRR, no murmur PULM: CTAB, Vt 200cc GI: obese, soft, NT, PEG in place without erythema, some dried blood around stoma. EXT: minimal edema, dry gangrene of distal 2 R digits. NEURO: awake and alert, able to weakly move LUE, globally weak but able to move RUE and legs on command. Attempting to answer questions. PSYCH: motivated, interactive. Derm: gangrenous digits, no rashes.  Pressure Injury 02/14/20 Anus Medial Deep Tissue Pressure Injury - Purple or maroon localized area of discolored intact skin or blood-filled blister due to damage of underlying soft tissue from pressure and/or shear. (Active)  02/14/20 1300  Location: Anus  Location Orientation: Medial  Staging: Deep Tissue Pressure Injury - Purple or maroon localized area of discolored intact skin or blood-filled blister due to damage of underlying soft tissue from pressure and/or shear.  Wound Description (Comments):   Present on Admission: No     Pressure Injury Back Left;Mid Stage 2 -  Partial thickness loss of dermis presenting as a shallow open injury with a red, pink wound bed without slough. in skin fold on back (Active)     Location: Back  Location Orientation: Left;Mid  Staging: Stage 2 -  Partial thickness loss of dermis presenting as a shallow open injury with a red, pink  wound bed without slough.  Wound Description (Comments): in skin fold on back  Present on Admission: No     ABG paO2 69 BMP stable on CRRT CBC stable Fibrinogen 547>>571>>481>>464>  431 >416>418>436> 415 LDH 489>>401>>376>>326> 321> 303> 315> 383 LA 1. 5 Heparin level 0. 1 6 PTT 69 Net - 120  Vent rate 15, pressure control 12, PEEP 10, 40% FiO2 with tidal volumes about 200 cc  CXR personally reviewed: Stable lung aeration, overall improving  Resolved Hospital Problem list   MSSA pneumonia Fungal  pneumonia- Candida dublinensis Enterococcal bacteremia-recent blood cultures cleared Concern for possible embolic phenomenon- fingers and possibly CVAs were embolic  Assessment & Plan:    Critically ill due to acute hypoxic and AoC hypercapneic respiratory failure; likely underly OHS.  Requiring VV ECMO support and mechanical ventilation.  ARDS due to COVID 19 pneumonia Tracheostomy in place -Con't VV ECMO support, con't weaning sweep; not yet ready for sweep trials. Prioritizing ECMO weaning over TC trials given concern for derecruitment. -Con't ultra-lung protective ventilation. Decreasing PEEP during PMV trials, otherwise want to optimize recruitment. -VAP prevention protocol -Sedation as needed- off continuous sedation >1 week. -Routine tracheostomy care -off all antibiotics; monitoring for development of infections  Elevated LDH, hypo-fibrinogenemia and thrombocytopenia--improved since circuit change. -Con't routine ECMO labs  ICH, multifocal. L-sided weakness-- improving .  Critical illness myopathy--improving slowly. -Continue PT, OT, SLP.  Very motivated to participate and showing progress -Her husband has been very involved in her ongoing care and works with her regularly on range of motion and conditioning exercises.  Acute metabolic encephalopathy, improving Sundowning in the evenings- better last night -Continue minimizing sedation as much as possible.  Promote appropriate sleep wake cycles, avoid deliria genic medications -Mobility is much as feasible -Continue family visitation as much as possible -Con't higher dose seroquel QHS  Acute renal failure, severe uremia.  Anuric. -Continue CVVHD.  Appreciate nephrology assistance.  Goal net -25cc/h to net even. -Renally dose medications and avoid nephrotoxic meds -Strict I's/O -Bladder scan every shift to monitor for return of renal function  Hypotension. Acute cor pulmonale resolved. Possibly related to critical  illness. -Continue midodrine 26m 3 times daily. Wide pulse pressure precludes weaning this.  Hyperglycemia > not controlled at all with [place] insulin despite aggressive upward titration.  Assume poor absorption, potentially due to subcutaneous edema.   -Improved on IV; trial of transition to [employer] insulin today.  Best practice:  Diet: tube feeding Pain/Anxiety/Delirium protocol (if indicated): as above VAP protocol (if indicated): yes DVT prophylaxis: SCDs , heparin fixed dose GI prophylaxis: pantoprazole Glucose control:  insulin infusion> trial basal bolus today  Mobility: bed rest Code Status: limited Family Communication: husband VLynnae Sandhoff Disposition: ICU  This patient is critically ill with multiple organ system failure which requires frequent high complexity decision making, assessment, support, evaluation, and titration of therapies. This was completed through the application of advanced monitoring technologies and extensive interpretation of multiple databases. During this encounter critical care time was devoted to patient care services described in this note for 46 minutes.  LJulian Hy MD 02/21/20 8:47 AM [facility]

## 2020-02-21 NOTE — Progress Notes (Signed)
[facility] for Heparin Indication: ECMO  Allergies  Allergen Reactions  . Sulfa Antibiotics Rash and Other (See Comments)    Patient Measurements: Height: 5\' 4"  (162.6 cm) Weight: 115.9 kg (255 lb 8.2 oz) IBW/kg (Calculated) : 54.7 Heparin Dosing Weight: 87 kg  Vital Signs: Temp: 98.7 F (37.1 C) (09/14 2000) Temp Source: Oral (09/14 2000) Pulse Rate: 94 (09/15 0600)  Labs: Recent Labs    02/20/20 0232 02/20/20 0233 02/20/20 1203 02/20/20 1545 02/20/20 1618 02/20/20 1621 02/20/20 1628 02/20/20 1942 02/20/20 1942 02/21/20 0257 02/21/20 0535  HGB 8.9*   < >   < >  --  9.6*  --    < > 10.2*   < > 8.5* 9.2*  HCT 29.4*   < >   < >  --  31.2*  --    < > 30.0*  --  28.3* 27.0*  PLT 113*  --   --   --  121*  --   --   --   --  115*  --   APTT 66*  --   --   --  80*  --   --   --   --  69*  --   HEPARINUNFRC 0.11*  --   --   --   --  0.22*  --   --   --  0.16*  --   CREATININE  --    < >  --  0.98 0.94  --   --   --   --  1.03*  --    < > = values in this interval not displayed.    Estimated Creatinine Clearance: 77.2 mL/min (A) (by C-G formula based on SCr of 1.03 mg/dL (H)).  Assessment: 54 yo female COVID+ on VV ECMO. Patient started on bivalirudin but was found to have multiple ICH on 8/13 head CT. Bivalirudin was stopped and pt was off anticoagulation at that time. Her ECMO circuit was changed 8/24. Pharmacy asked to start low-fixed rate heparin. Heparin infusion was held last week due to bleeding from trach site. Heparin was started at low fixed rate for pump patency in setting of ICH. Concern with increased  clotting off crrt so will start to titrate heparin to a low goal of  (~0.15-0.2 heparin levels).  Hep lvl within goal range at 0.16.  Goal of Therapy:  HL 0.15-0.2  Monitor platelets by anticoagulation protocol: Yes   Plan:  Continue heparin 850 units/hr q12h heparin level, CBC   Arrie Senate, PharmD, BCPS Clinical  Pharmacist [phone] Please check AMION for all [facility] numbers 02/21/2020

## 2020-02-21 NOTE — Progress Notes (Signed)
[facility] Progress Note    Assessment/ Plan:    51F severe MSOF from COVID-19 pneumonia requiring VV ECMO.  1.  Anuric AKI due to shock, sepsis causing acute tubular necrosis:CRRT on 8/22- 02/12/20. IHD 02/13/20.  Planned for 9/9 but back to CRRT d/t fluid overload.  L IJ nontunneled catheter changed 9/9 from L [place].   - Continue CRRT.  On All 4K.  Reduced dialysate to 1.5 L/hr from 2 on 9/13.  Note renal added flat rate heparin 500 u/ hr to circuit 02/18/20 d/t clotting x 2.   - UF goal net neg 50 mL/ hr as tolerated   2.Shock due to sepsis, Covid infection, blood cultures positive for Enterococcus bacteremia: s/p Zosyn (ended 9/7), cultures repeated 9/7, NGTD.  TTE 9/7 no AI, done for concern of wide pulse pressure  3.Acute hypoxic and hypercapnic respiratory failure, ARDS due to COVID-19 pneumonia: on VV ECMO for refractory hypoxemia.  3.Hypernatremia, hypervolemia: relative free water deficit. Improved with CRRT.  4.Acute toxic metabolic encephalopathy:Multifactorial, improving  5.Anemia, thrombocytopenia: With hemolysis likely and acute illness contributing. Transfusions per primary team.  6.Intracranial hemorrhage: With left-sided weakness, ? Septic emboli  7.  Diabetes per primary  8.  Hypophosphatemia repleting as needed   9.  Critical illness myopathy: PT as able per primary team    Subjective:    Seen and examined on CRRT.  Goal of net neg 50 an hour as tolerated and she ended up getting 25 an hour off overnight due to issues with ECMO.   No urine output charted.  Had 2.5 L UF with CRRT over 9/14.  Continues on ECMO.  Review of systems Shortness of breath  Denies chest pain  Denies n/v    Objective:   BP (!) 103/45   Pulse 92   Temp 98.7 F (37.1 C) (Oral)   Resp (!) 22   Ht 5' 4"  (1.626 m)   Wt 110.8 kg   SpO2 94%   BMI 41.93 kg/m   Intake/Output Summary (Last 24 hours) at 02/21/2020 5852 Last data filed at 02/21/2020 0500 Gross  per 24 hour  Intake 2447.58 ml  Output 2664 ml  Net -216.42 ml   Weight change:   Physical Exam:   Gen: critically ill, lying in bed in NAD NECK: R IJ ECMO access CVS: RRR Resp: reduced on auscultation  Abd: obese Ext: trace edema bilaterally ACCESS: L IJ nontunneled HD catheter MSK: necrotic thumb and 1st finger R hand Psych no anxiety or agitation Neuro - alert and oriented - provides hx and follows commands  Imaging: DG CHEST PORT 1 VIEW  Result Date: 02/20/2020 CLINICAL DATA:  ECMO, respiratory failure, COVID EXAM: PORTABLE CHEST 1 VIEW COMPARISON:  02/19/2020 FINDINGS: Support devices are stable. Severe diffuse bilateral airspace disease. Mild cardiomegaly. No change since prior study. IMPRESSION: No interval change. Electronically Signed   By: Rolm Baptise M.D.   On: 02/20/2020 08:15    Labs: BMET Recent Labs  Lab 02/18/20 0400 02/18/20 0416 02/18/20 1641 02/18/20 1809 02/19/20 0335 02/19/20 0350 02/19/20 1641 02/19/20 1641 02/19/20 1700 02/19/20 1756 02/20/20 0233 02/20/20 0552 02/20/20 1203 02/20/20 1545 02/20/20 1618 02/20/20 1628 02/20/20 1942 02/21/20 0257 02/21/20 0535  NA 137   < > 136   < > 137   < > 134*   < > 136   < > 135   < > 138 136 135 137 137 134* 137  K 3.7   < > 4.2   < >  3.7   < > 4.1   < > 4.1   < > 3.6   < > 4.1 3.9 4.0 4.1 4.3 4.1 3.9  CL 103   < > 101   < > 103  --  99  --  101  --  100  --   --  101 100  --   --  101  --   CO2 25   < > 25   < > 26  --  25  --  26  --  25  --   --  24 24  --   --  24  --   GLUCOSE 167*   < > 208*   < > 176*  --  188*  --  187*  --  172*  --   --  188* 197*  --   --  184*  --   BUN 14   < > 14   < > 15  --  18  --  18  --  20  --   --  21* 22*  --   --  27*  --   CREATININE 1.12*   < > 1.13*   < > 0.93  --  0.86  --  0.91  --  0.86  --   --  0.98 0.94  --   --  1.03*  --   CALCIUM 8.2*   < > 8.6*   < > 8.2*  --  8.1*  --  8.1*  --  8.3*  --   --  8.4* 8.5*  --   --  8.3*  --   PHOS 2.2*  --  2.5  --   1.3*  --  3.5  --   --   --  1.9*  --   --  3.8  --   --   --  3.1  --    < > = values in this interval not displayed.   CBC Recent Labs  Lab 02/19/20 1700 02/19/20 1756 02/20/20 0232 02/20/20 0552 02/20/20 1618 02/20/20 1618 02/20/20 1628 02/20/20 1942 02/21/20 0257 02/21/20 0535  WBC 9.8  --  7.5  --  10.2  --   --   --  8.9  --   HGB 9.2*   < > 8.9*   < > 9.6*   < > 10.2* 10.2* 8.5* 9.2*  HCT 30.9*   < > 29.4*   < > 31.2*   < > 30.0* 30.0* 28.3* 27.0*  MCV 108.0*  --  107.7*  --  108.3*  --   --   --  107.6*  --   PLT 126*  --  113*  --  121*  --   --   --  115*  --    < > = values in this interval not displayed.    Medications:    . amiodarone  200 mg Per Tube Daily  . vitamin C  500 mg Per Tube Daily  . chlorhexidine gluconate (MEDLINE KIT)  15 mL Mouth Rinse BID  . Chlorhexidine Gluconate Cloth  6 each Topical Q0600  . clonazePAM  1 mg Per Tube Q6H  . collagenase   Topical Daily  . docusate  100 mg Per Tube BID  . feeding supplement (PROSource TF)  45 mL Per Tube QID  . Gerhardt's butt cream   Topical BID  . linagliptin  5 mg Per Tube Daily  . lipase/protease/amylase)  20,880 Units Per Tube Once   And  . sodium bicarbonate  650 mg Per Tube Once  . mouth rinse  15 mL Mouth Rinse 10 times per day  . melatonin  9 mg Per Tube QHS  . midodrine  10 mg Per Tube TID  . multivitamin  1 tablet Per Tube QHS  . oxyCODONE  10 mg Per Tube Q6H  . pantoprazole (PROTONIX) IV  40 mg Intravenous BID  . polyethylene glycol  17 g Per Tube Daily  . QUEtiapine  100 mg Per Tube QHS  . sennosides  5 mL Per Tube BID  . zinc sulfate  220 mg Per Tube Daily     Claudia Desanctis MD 02/21/2020, 6:39 AM

## 2020-02-21 NOTE — Progress Notes (Signed)
Assisted tele visit to patient with family member.  Zohaib Heeney McEachran, RN  

## 2020-02-21 NOTE — Progress Notes (Signed)
Patient ID: DECKLYN HORNIK, female   DOB: 1965/09/09, 54 y.o.   MRN: [id number]    Advanced Heart Failure Rounding Note   Subjective:    - 8/2 COVID + test - 8/9 Cannulated for VV ECMO - 8/13 with several areas of intracranial hemorrhage. Bival stopped.  - 8/14 CT no change in [place]. Increased edema - 8/16 Extubated - 8/16 Head CT stable bleed - 8/22 CVVHD started - 8/24 reintubated - 8/24 circuit changed - 8/30 trach - 9/7  Switched to iHD - 02/15/20 Switched back to CVVHD - 9/10 GJ tube placed  Remains awake on vent via trach. Following commands and able to move more, working with PT/OT. Saw Speech today for PM valve  Remains on CVVHD. Remains anuric. Pulling 50/hr. Had chugging overnight and got albumin.   Slept better last night on higher dose of Seroquel  CXR diffuse infiltrates. Slightly improved.   ECMO  Flow 4.65 L RPM 4100 Sweep10  Labs: 7.37/46/69/93% 40% FiO2 TV 200cc Hgb8.5 PLT 115k LDH 315 -> 374 -> 383 Lactic acid1.5   Objective:   Weight Range:  Vital Signs:   Temp:  [98.7 F (37.1 C)] 98.7 F (37.1 C) (09/14 2000) Pulse Rate:  [89-106] 98 (09/15 0743) Resp:  [22-35] 35 (09/15 0743) BP: (103-104)/(42-45) 104/42 (09/15 0743) SpO2:  [88 %-100 %] 90 % (09/15 0743) FiO2 (%):  [40 %] 40 % (09/15 0743) Weight:  [115.9 kg] 115.9 kg (09/15 0500) Last BM Date: 02/20/20  Weight change: Filed Weights   02/19/20 0500 02/20/20 0500 02/21/20 0500  Weight: 108.6 kg 110.8 kg 115.9 kg    Intake/Output:   Intake/Output Summary (Last 24 hours) at 02/21/2020 0836 Last data filed at 02/21/2020 0800 Gross per 24 hour  Intake 2605.4 ml  Output 2702 ml  Net -96.6 ml     Physical Exam: General:  In bed. No resp difficulty HEENT: normal Neck: supple. RIJ ECMO  LIJ trialysis Carotids 2+ bilat; no bruits. No lymphadenopathy or thryomegaly appreciated. Cor: PMI nondisplaced. Regular rate & rhythm. No rubs, gallops or murmurs. Lungs: clear Abdomen: soft,  nontender, nondistended. No hepatosplenomegaly. No bruits or masses. Good bowel sounds. Extremities: no cyanosis, clubbing, rash, trace edema Neuro: alert & orientedx3, cranial nerves grossly intact. Very weak   Telemetry: Sinus 90-100 Personally reviewed   Labs: Basic Metabolic Panel: Recent Labs  Lab 02/17/20 0218 02/17/20 0219 02/18/20 0400 02/18/20 0416 02/19/20 0335 02/19/20 0350 02/19/20 1641 02/19/20 1641 02/19/20 1700 02/19/20 1700 02/19/20 1756 02/20/20 0232 02/20/20 0233 02/20/20 0552 02/20/20 1545 02/20/20 1545 02/20/20 1618 02/20/20 1628 02/20/20 1942 02/21/20 0257 02/21/20 0535  NA  --    < > 137   < > 137   < > 134*   < > 136   < >   < >  --  135   < > 136   < > 135 137 137 134* 137  K  --    < > 3.7   < > 3.7   < > 4.1   < > 4.1   < >   < >  --  3.6   < > 3.9   < > 4.0 4.1 4.3 4.1 3.9  CL  --    < > 103   < > 103   < > 99   < > 101  --   --   --  100  --  101  --  100  --   --  101  --  CO2  --    < > 25   < > 26   < > 25   < > 26  --   --   --  25  --  24  --  24  --   --  24  --   GLUCOSE  --    < > 167*   < > 176*   < > 188*   < > 187*  --   --   --  172*  --  188*  --  197*  --   --  184*  --   BUN  --    < > 14   < > 15   < > 18   < > 18  --   --   --  20  --  21*  --  22*  --   --  27*  --   CREATININE  --    < > 1.12*   < > 0.93   < > 0.86   < > 0.91  --   --   --  0.86  --  0.98  --  0.94  --   --  1.03*  --   CALCIUM  --    < > 8.2*   < > 8.2*   < > 8.1*   < > 8.1*   < >  --   --  8.3*   < > 8.4*  --  8.5*  --   --  8.3*  --   MG 2.5*  --  2.6*  --  2.5*  --   --   --   --   --   --  2.5*  --   --   --   --   --   --   --  2.6*  --   PHOS  --    < > 2.2*   < > 1.3*  --  3.5  --   --   --   --   --  1.9*  --  3.8  --   --   --   --  3.1  --    < > = values in this interval not displayed.    Liver Function Tests: Recent Labs  Lab 02/19/20 0335 02/19/20 1641 02/20/20 0233 02/20/20 1545 02/21/20 0257  ALBUMIN 2.4* 2.7* 2.5* 2.7* 2.7*   No  results for input(s): LIPASE, AMYLASE in the last 168 hours. No results for input(s): AMMONIA in the last 168 hours.  CBC: Recent Labs  Lab 02/19/20 0335 02/19/20 0350 02/19/20 1700 02/19/20 1756 02/20/20 0232 02/20/20 0552 02/20/20 1618 02/20/20 1628 02/20/20 1942 02/21/20 0257 02/21/20 0535  WBC 6.9  --  9.8  --  7.5  --  10.2  --   --  8.9  --   HGB 8.7*   < > 9.2*   < > 8.9*   < > 9.6* 10.2* 10.2* 8.5* 9.2*  HCT 27.8*   < > 30.9*   < > 29.4*   < > 31.2* 30.0* 30.0* 28.3* 27.0*  MCV 107.8*  --  108.0*  --  107.7*  --  108.3*  --   --  107.6*  --   PLT 117*  --  126*  --  113*  --  121*  --   --  115*  --    < > = values in this interval not displayed.    Cardiac  Enzymes: No results for input(s): CKTOTAL, CKMB, CKMBINDEX, TROPONINI in the last 168 hours.  BNP: BNP (last 3 results) No results for input(s): BNP in the last 8760 hours.  ProBNP (last 3 results) No results for input(s): PROBNP in the last 8760 hours.    Other results:  Imaging: DG CHEST PORT 1 VIEW  Result Date: 02/21/2020 CLINICAL DATA:  Respiratory failure. EXAM: PORTABLE CHEST 1 VIEW COMPARISON:  February 20, 2020. FINDINGS: Stable cardiomegaly. Tracheostomy is unchanged in position. ECMO device is unchanged. Left internal jugular catheter is unchanged. Stable bilateral lung opacities are noted consistent with multifocal pneumonia. Small pleural effusions may be present. No pneumothorax is noted. Bony thorax is unremarkable. IMPRESSION: Stable support apparatus. Stable bilateral lung opacities consistent with multifocal pneumonia. Electronically Signed   By: Marijo Conception M.D.   On: 02/21/2020 08:25   DG CHEST PORT 1 VIEW  Result Date: 02/20/2020 CLINICAL DATA:  ECMO, respiratory failure, COVID EXAM: PORTABLE CHEST 1 VIEW COMPARISON:  02/19/2020 FINDINGS: Support devices are stable. Severe diffuse bilateral airspace disease. Mild cardiomegaly. No change since prior study. IMPRESSION: No interval  change. Electronically Signed   By: Rolm Baptise M.D.   On: 02/20/2020 08:15     Medications:     Scheduled Medications: . amiodarone  200 mg Per Tube Daily  . vitamin C  500 mg Per Tube Daily  . chlorhexidine gluconate (MEDLINE KIT)  15 mL Mouth Rinse BID  . Chlorhexidine Gluconate Cloth  6 each Topical Q0600  . clonazePAM  1 mg Per Tube Q6H  . collagenase   Topical Daily  . docusate  100 mg Per Tube BID  . feeding supplement (PROSource TF)  45 mL Per Tube QID  . Gerhardt's butt cream   Topical BID  . [START ON 02/22/2020] influenza vac split quadrivalent PF  0.5 mL Intramuscular Tomorrow-1000  . linagliptin  5 mg Per Tube Daily  . lipase/protease/amylase)  20,880 Units Per Tube Once   And  . sodium bicarbonate  650 mg Per Tube Once  . mouth rinse  15 mL Mouth Rinse 10 times per day  . melatonin  9 mg Per Tube QHS  . midodrine  10 mg Per Tube TID  . multivitamin  1 tablet Per Tube QHS  . oxyCODONE  10 mg Per Tube Q6H  . pantoprazole (PROTONIX) IV  40 mg Intravenous BID  . [START ON 02/22/2020] pneumococcal 23 valent vaccine  0.5 mL Intramuscular Tomorrow-1000  . polyethylene glycol  17 g Per Tube Daily  . QUEtiapine  100 mg Per Tube QHS  . sennosides  5 mL Per Tube BID  . zinc sulfate  220 mg Per Tube Daily    Infusions: .  prismasol BGK 4/2.5 400 mL/hr at 02/20/20 1431  .  prismasol BGK 4/2.5 200 mL/hr at 02/21/20 0101  . sodium chloride 500 mL (02/11/20 2215)  . sodium chloride 10 mL/hr at 02/21/20 0800  . sodium chloride    . sodium chloride    . albumin human Stopped (02/20/20 2230)  . albumin human Stopped (02/06/20 2000)  . feeding supplement (PIVOT 1.5 CAL) 65 mL/hr at 02/21/20 0700  . heparin 10,000 units/ 20 mL infusion syringe 500 Units/hr (02/21/20 0215)  . heparin 850 Units/hr (02/21/20 0800)  . insulin 6 mL/hr at 02/21/20 0800  . norepinephrine (LEVOPHED) Adult infusion Stopped (02/17/20 1948)  . phenylephrine (NEO-SYNEPHRINE) Adult infusion Stopped  (02/13/20 0155)  . prismasol BGK 4/2.5 1,500 mL/hr at 02/21/20 [phone]  PRN Medications: sodium chloride, sodium chloride, sodium chloride, sodium chloride, acetaminophen (TYLENOL) oral liquid 160 mg/5 mL, albumin human, albuterol, alteplase, alum & mag hydroxide-simeth, dextrose, fentaNYL (SUBLIMAZE) injection, guaiFENesin-dextromethorphan, heparin, heparin, heparin, hydrALAZINE, HYDROmorphone (DILAUDID) injection, hydrOXYzine, lidocaine (PF), lidocaine-prilocaine, ondansetron **OR** ondansetron (ZOFRAN) IV, pentafluoroprop-tetrafluoroeth   Assessment/Plan:   1. Acute hypoxic/hypercapneic respiratory failure in setting of severe COVID PNA/ARDS -> VV ECMO - admit 8/2 - intubation 8/3 - has received actmera (compelted 8/2), remdesivir (completed 8/6) and steroids - Cannulated for VV ECMO on 8/9 - Extubated 8/16. Reintubated 8/24 - s/p trach 8/30. - Circuit changed 8/24 due to concern for hemolysis and worsening oxygenation. - CVVHD started 8/22 for volume removal and uremia. Remains anuric. CXR worse on 9/9 so switched back to CVVHD.  Running at -50/hr and having chugging. Given albumin. Will decrease pul rate to even to -25/hr - Off IV sedation. Continue melatonin and seroquel for sleep at night. Adjusted to help with restlessness and sundowning at night - Off pressors and on midodrine 10 tid - Lung function/TVs improving slowly. Wean sweep as tolerated - Main issues remain to be poor lung recovery, critical care myopathy and AKI. Continue slow wean as tolerated. Need to prioritize ECMO wean then vent wean.  - Continue heparin with level 0.2-0.3 Discussed dosing with PharmD personally.   2. Enterococcus sepsis - Finished high-dose zosyn on 9/7 per ID - Eraxis added on 8/26 with yeast in BAL 8/24 (ended 9/2)  - Cultures repeated with increasing pressor demands - F/u cx 9/7 Remain NGTD.  - No change  3. Intracranial hemorrhage - ? Septic emboli - repeat head CT on 8/14 with stable  bleeds but increased edema - neurology has seen. Suspect significant long-term injury sustained. Will follow commands. Appears to have dense LUE weakness and possibly LLE - repeat head CT stable 8/16 - tolerating heparin  4. Thrombocytopenia - PLTs 110-120k   - Continue to follow. Stable   5. Morbid obesity - Body mass index is Body mass index is 43.86 kg/m.  6. Poorly controlled DM2 - HgBA1c 10.7 - On insulin gtt - Will try to convert to SQ insulin today  7. PAF - intermittent episodes. Last 8/26 - Now on po. Quiescent  8. AKI/azotemia - CVVHD started on 8/22 - Down 70 pounds.  - transitioned to [facility] on 9/7 - switched back to CVVHD for better volume removal on 9/9. - new catheter placed 9/9 - will eventually need tunneled access - Chugging overnight change to UF rate to even to -25   9. Emesis - Cor-trak out.  - Has GJ tube now, slowly increase TFs.   10. Sacral decub  - Wound care following  11. Necrotic fingers - VVS following - suspect will auto amputate  12. Anemia - Transfuse hgb < 8 - 8.5 today  CRITICAL CARE Performed by: Glori Bickers  Total critical care time: 35 minutes  Critical care time was exclusive of separately billable procedures and treating other patients.  Critical care was necessary to treat or prevent imminent or life-threatening deterioration.  Critical care was time spent personally by me (independent of midlevel providers or residents) on the following activities: development of treatment plan with patient and/or surrogate as well as nursing, discussions with consultants, evaluation of patient's response to treatment, examination of patient, obtaining history from patient or surrogate, ordering and performing treatments and interventions, ordering and review of laboratory studies, ordering and review of radiographic studies, pulse oximetry and re-evaluation of patient's condition.  Length of Stay: [place]   MD 02/21/2020, 8:36 AM  Advanced Heart Failure Team Pager [phone] (M-F; [place])  Please contact [facility] for night-coverage after hours (4p -7a ) and weekends on [email or web address]

## 2020-02-22 ENCOUNTER — Inpatient Hospital Stay (HOSPITAL_COMMUNITY): Payer: PRIVATE HEALTH INSURANCE

## 2020-02-22 LAB — POCT I-STAT 7, (LYTES, BLD GAS, ICA,H+H)
Acid-Base Excess: 0 mmol/L (ref 0.0–2.0)
Acid-Base Excess: 1 mmol/L (ref 0.0–2.0)
Acid-Base Excess: 1 mmol/L (ref 0.0–2.0)
Acid-Base Excess: 2 mmol/L (ref 0.0–2.0)
Acid-Base Excess: 2 mmol/L (ref 0.0–2.0)
Acid-Base Excess: 2 mmol/L (ref 0.0–2.0)
Acid-Base Excess: 3 mmol/L — ABNORMAL HIGH (ref 0.0–2.0)
Bicarbonate: 25.7 mmol/L (ref 20.0–28.0)
Bicarbonate: 27.1 mmol/L (ref 20.0–28.0)
Bicarbonate: 27.1 mmol/L (ref 20.0–28.0)
Bicarbonate: 27.2 mmol/L (ref 20.0–28.0)
Bicarbonate: 27.6 mmol/L (ref 20.0–28.0)
Bicarbonate: 28.2 mmol/L — ABNORMAL HIGH (ref 20.0–28.0)
Bicarbonate: 29.2 mmol/L — ABNORMAL HIGH (ref 20.0–28.0)
Calcium, Ion: 1.16 mmol/L (ref 1.15–1.40)
Calcium, Ion: 1.16 mmol/L (ref 1.15–1.40)
Calcium, Ion: 1.17 mmol/L (ref 1.15–1.40)
Calcium, Ion: 1.17 mmol/L (ref 1.15–1.40)
Calcium, Ion: 1.18 mmol/L (ref 1.15–1.40)
Calcium, Ion: 1.19 mmol/L (ref 1.15–1.40)
Calcium, Ion: 1.22 mmol/L (ref 1.15–1.40)
HCT: 25 % — ABNORMAL LOW (ref 36.0–46.0)
HCT: 25 % — ABNORMAL LOW (ref 36.0–46.0)
HCT: 30 % — ABNORMAL LOW (ref 36.0–46.0)
HCT: 30 % — ABNORMAL LOW (ref 36.0–46.0)
HCT: 31 % — ABNORMAL LOW (ref 36.0–46.0)
HCT: 31 % — ABNORMAL LOW (ref 36.0–46.0)
HCT: 31 % — ABNORMAL LOW (ref 36.0–46.0)
Hemoglobin: 10.2 g/dL — ABNORMAL LOW (ref 12.0–15.0)
Hemoglobin: 10.2 g/dL — ABNORMAL LOW (ref 12.0–15.0)
Hemoglobin: 10.5 g/dL — ABNORMAL LOW (ref 12.0–15.0)
Hemoglobin: 10.5 g/dL — ABNORMAL LOW (ref 12.0–15.0)
Hemoglobin: 10.5 g/dL — ABNORMAL LOW (ref 12.0–15.0)
Hemoglobin: 8.5 g/dL — ABNORMAL LOW (ref 12.0–15.0)
Hemoglobin: 8.5 g/dL — ABNORMAL LOW (ref 12.0–15.0)
O2 Saturation: 86 %
O2 Saturation: 88 %
O2 Saturation: 88 %
O2 Saturation: 89 %
O2 Saturation: 89 %
O2 Saturation: 90 %
O2 Saturation: 93 %
Patient temperature: 97.8
Patient temperature: 98.7
Patient temperature: 98.8
Potassium: 4 mmol/L (ref 3.5–5.1)
Potassium: 4.1 mmol/L (ref 3.5–5.1)
Potassium: 4.3 mmol/L (ref 3.5–5.1)
Potassium: 4.4 mmol/L (ref 3.5–5.1)
Potassium: 4.5 mmol/L (ref 3.5–5.1)
Potassium: 4.5 mmol/L (ref 3.5–5.1)
Potassium: 4.6 mmol/L (ref 3.5–5.1)
Sodium: 135 mmol/L (ref 135–145)
Sodium: 136 mmol/L (ref 135–145)
Sodium: 136 mmol/L (ref 135–145)
Sodium: 136 mmol/L (ref 135–145)
Sodium: 137 mmol/L (ref 135–145)
Sodium: 137 mmol/L (ref 135–145)
Sodium: 138 mmol/L (ref 135–145)
TCO2: 27 mmol/L (ref 22–32)
TCO2: 28 mmol/L (ref 22–32)
TCO2: 29 mmol/L (ref 22–32)
TCO2: 29 mmol/L (ref 22–32)
TCO2: 29 mmol/L (ref 22–32)
TCO2: 30 mmol/L (ref 22–32)
TCO2: 31 mmol/L (ref 22–32)
pCO2 arterial: 45 mmHg (ref 32.0–48.0)
pCO2 arterial: 46 mmHg (ref 32.0–48.0)
pCO2 arterial: 46.1 mmHg (ref 32.0–48.0)
pCO2 arterial: 49.5 mmHg — ABNORMAL HIGH (ref 32.0–48.0)
pCO2 arterial: 51.6 mmHg — ABNORMAL HIGH (ref 32.0–48.0)
pCO2 arterial: 53.5 mmHg — ABNORMAL HIGH (ref 32.0–48.0)
pCO2 arterial: 54.9 mmHg — ABNORMAL HIGH (ref 32.0–48.0)
pH, Arterial: 7.31 — ABNORMAL LOW (ref 7.350–7.450)
pH, Arterial: 7.344 — ABNORMAL LOW (ref 7.350–7.450)
pH, Arterial: 7.345 — ABNORMAL LOW (ref 7.350–7.450)
pH, Arterial: 7.346 — ABNORMAL LOW (ref 7.350–7.450)
pH, Arterial: 7.355 (ref 7.350–7.450)
pH, Arterial: 7.379 (ref 7.350–7.450)
pH, Arterial: 7.391 (ref 7.350–7.450)
pO2, Arterial: 53 mmHg — ABNORMAL LOW (ref 83.0–108.0)
pO2, Arterial: 57 mmHg — ABNORMAL LOW (ref 83.0–108.0)
pO2, Arterial: 57 mmHg — ABNORMAL LOW (ref 83.0–108.0)
pO2, Arterial: 61 mmHg — ABNORMAL LOW (ref 83.0–108.0)
pO2, Arterial: 61 mmHg — ABNORMAL LOW (ref 83.0–108.0)
pO2, Arterial: 63 mmHg — ABNORMAL LOW (ref 83.0–108.0)
pO2, Arterial: 70 mmHg — ABNORMAL LOW (ref 83.0–108.0)

## 2020-02-22 LAB — RENAL FUNCTION PANEL
Albumin: 3 g/dL — ABNORMAL LOW (ref 3.5–5.0)
Albumin: 3.3 g/dL — ABNORMAL LOW (ref 3.5–5.0)
Anion gap: 9 (ref 5–15)
Anion gap: 10 (ref 5–15)
BUN: 26 mg/dL — ABNORMAL HIGH (ref 6–20)
BUN: 27 mg/dL — ABNORMAL HIGH (ref 6–20)
CO2: 25 mmol/L (ref 22–32)
CO2: 25 mmol/L (ref 22–32)
Calcium: 8.4 mg/dL — ABNORMAL LOW (ref 8.9–10.3)
Calcium: 8.7 mg/dL — ABNORMAL LOW (ref 8.9–10.3)
Chloride: 102 mmol/L (ref 98–111)
Chloride: 101 mmol/L (ref 98–111)
Creatinine, Ser: 0.93 mg/dL (ref 0.44–1.00)
Creatinine, Ser: 0.94 mg/dL (ref 0.44–1.00)
GFR calc Af Amer: >60 mL/min (ref >60)
GFR calc Af Amer: >60 mL/min (ref >60)
GFR calc non Af Amer: >60 mL/min (ref >60)
GFR calc non Af Amer: >60 mL/min (ref >60)
Glucose, Bld: 198 mg/dL — ABNORMAL HIGH (ref 70–99)
Glucose, Bld: 214 mg/dL — ABNORMAL HIGH (ref 70–99)
Phosphorus: 2.4 mg/dL — ABNORMAL LOW (ref 2.5–4.6)
Phosphorus: 3.6 mg/dL (ref 2.5–4.6)
Potassium: 4.1 mmol/L (ref 3.5–5.1)
Potassium: 4.4 mmol/L (ref 3.5–5.1)
Sodium: 136 mmol/L (ref 135–145)
Sodium: 136 mmol/L (ref 135–145)

## 2020-02-22 LAB — CBC
HCT: 26.3 % — ABNORMAL LOW (ref 36.0–46.0)
HCT: 32.1 % — ABNORMAL LOW (ref 36.0–46.0)
Hemoglobin: 7.8 g/dL — ABNORMAL LOW (ref 12.0–15.0)
Hemoglobin: 9.7 g/dL — ABNORMAL LOW (ref 12.0–15.0)
MCH: 32.5 pg (ref 26.0–34.0)
MCH: 31.4 pg (ref 26.0–34.0)
MCHC: 29.7 g/dL — ABNORMAL LOW (ref 30.0–36.0)
MCHC: 30.2 g/dL (ref 30.0–36.0)
MCV: 109.6 fL — ABNORMAL HIGH (ref 80.0–100.0)
MCV: 103.9 fL — ABNORMAL HIGH (ref 80.0–100.0)
Platelets: 102 10*3/uL — ABNORMAL LOW (ref 150–400)
Platelets: 101 10*3/uL — ABNORMAL LOW (ref 150–400)
RBC: 2.4 MIL/uL — ABNORMAL LOW (ref 3.87–5.11)
RBC: 3.09 MIL/uL — ABNORMAL LOW (ref 3.87–5.11)
RDW: 23.7 % — ABNORMAL HIGH (ref 11.5–15.5)
RDW: 24 % — ABNORMAL HIGH (ref 11.5–15.5)
WBC: 6.9 10*3/uL (ref 4.0–10.5)
WBC: 8.8 10*3/uL (ref 4.0–10.5)
nRBC: 1.5 % — ABNORMAL HIGH (ref 0.0–0.2)
nRBC: 1.7 % — ABNORMAL HIGH (ref 0.0–0.2)

## 2020-02-22 LAB — LACTATE DEHYDROGENASE: LDH: 331 U/L — ABNORMAL HIGH (ref 98–192)

## 2020-02-22 LAB — PREPARE RBC (CROSSMATCH)

## 2020-02-22 LAB — MAGNESIUM: Magnesium: 2.7 mg/dL — ABNORMAL HIGH (ref 1.7–2.4)

## 2020-02-22 LAB — GLUCOSE, CAPILLARY
Glucose-Capillary: 178 mg/dL — ABNORMAL HIGH (ref 70–99)
Glucose-Capillary: 181 mg/dL — ABNORMAL HIGH (ref 70–99)
Glucose-Capillary: 202 mg/dL — ABNORMAL HIGH (ref 70–99)
Glucose-Capillary: 205 mg/dL — ABNORMAL HIGH (ref 70–99)
Glucose-Capillary: 211 mg/dL — ABNORMAL HIGH (ref 70–99)
Glucose-Capillary: 215 mg/dL — ABNORMAL HIGH (ref 70–99)

## 2020-02-22 LAB — FIBRINOGEN: Fibrinogen: 411 mg/dL (ref 210–475)

## 2020-02-22 LAB — HEPARIN LEVEL (UNFRACTIONATED)
Heparin Unfractionated: 0.14 IU/mL — ABNORMAL LOW (ref 0.30–0.70)
Heparin Unfractionated: 0.16 IU/mL — ABNORMAL LOW (ref 0.30–0.70)

## 2020-02-22 LAB — LACTIC ACID, PLASMA
Lactic Acid, Venous: 1.6 mmol/L (ref 0.5–1.9)
Lactic Acid, Venous: 1.2 mmol/L (ref 0.5–1.9)

## 2020-02-22 LAB — APTT
aPTT: 80 seconds — ABNORMAL HIGH (ref 24–36)
aPTT: 77 seconds — ABNORMAL HIGH (ref 24–36)

## 2020-02-22 MED ORDER — SODIUM CHLORIDE 0.9% IV SOLUTION: Freq: Once | INTRAVENOUS | Status: AC

## 2020-02-22 MED ORDER — METOCLOPRAMIDE HCL 5 MG/ML IJ SOLN
5.0000 mg | Freq: Three times a day (TID) | INTRAMUSCULAR | Status: DC
  Administered 2020-02-22 – 2020-03-04 (×34): 5 mg via INTRAVENOUS
  Filled 2020-02-22 (×33): qty 2

## 2020-02-22 MED ORDER — SODIUM PHOSPHATES 45 MMOLE/15ML IV SOLN
20.0000 mmol | Freq: Once | INTRAVENOUS | Status: AC
  Administered 2020-02-22: 20 mmol via INTRAVENOUS
  Filled 2020-02-22: qty 6.67

## 2020-02-22 NOTE — Progress Notes (Signed)
[place] for Heparin Indication: ECMO  Allergies  Allergen Reactions  . Sulfa Antibiotics Rash and Other (See Comments)    Patient Measurements: Height: 5\' 4"  (162.6 cm) Weight: 113.2 kg (249 lb 9 oz) IBW/kg (Calculated) : 54.7 Heparin Dosing Weight: 87 kg  Vital Signs: Temp: 97.8 F (36.6 C) (09/16 0400) Temp Source: Oral (09/16 0400) BP: 97/39 (09/16 0511) Pulse Rate: 102 (09/16 0800)  Labs: Recent Labs    02/21/20 0257 02/21/20 0535 02/21/20 1558 02/21/20 1712 02/21/20 1957 02/21/20 1957 02/22/20 0422 02/22/20 0436  HGB 8.5*   < > 8.7*   < > 8.8*   < > 7.8* 8.5*  HCT 28.3*   < > 28.5*   < > 26.0*  --  26.3* 25.0*  PLT 115*  --  108*  --   --   --  102*  --   APTT 69*  --  94*  --   --   --  80*  --   HEPARINUNFRC 0.16*  --  0.23*  --   --   --  0.14*  --   CREATININE 1.03*  --  0.99  --   --   --  0.93  --    < > = values in this interval not displayed.    Estimated Creatinine Clearance: 84.3 mL/min (by C-G formula based on SCr of 0.93 mg/dL).  Assessment: 54 yo female COVID+ on VV ECMO. Patient started on bivalirudin but was found to have multiple ICH on 8/13 head CT. Bivalirudin was stopped and pt was off anticoagulation at that time. Her ECMO circuit was changed 8/24. Pharmacy asked to start low-fixed rate heparin. Heparin infusion was held last week due to bleeding from trach site. Heparin was started at low fixed rate for pump patency in setting of ICH. Concern with increased  clotting off crrt so will start to titrate heparin to a low goal of  (~0.15-0.2 heparin levels).  Hep level slightly below goal on 800 units/h, H/H down slightly.   Goal of Therapy:  HL 0.15-0.2  Monitor platelets by anticoagulation protocol: Yes   Plan:  Increase heparin to 850 units/hr q12h heparin level, CBC   Arrie Senate, PharmD, BCPS Clinical Pharmacist [phone] Please check AMION for all [facility] numbers 02/22/2020

## 2020-02-22 NOTE — Progress Notes (Signed)
Nutrition Follow-up  DOCUMENTATION CODES:   Morbid obesity  INTERVENTION:   Tube Feeding via J-port of G-J tube:  Pivot 1.5 at 65 ml/hr Pro-Source TF 45 mL QID Provides 2500 kcals, 190 g of protein and 1170 mL of free water  Meets 100% estimated calorie and protein needs  Given prolonged CRRT and increased risk for micronutrient deficiencies, recommend checking  Copper, Zinc, Vit C, Folate, Selenium, Thiamine.Recommendations to follow based on lab results  NUTRITION DIAGNOSIS:   Increased nutrient needs related to acute illness (COVID-19 infection) as evidenced by estimated needs.  Progressing  GOAL:   Patient will meet greater than or equal to 90% of their needs  Met  MONITOR:   Vent status, TF tolerance, Labs, Weight trends  REASON FOR ASSESSMENT:   Ventilator, Consult Enteral/tube feeding initiation and management  ASSESSMENT:   54 y.o. female with medical history of type 2 DM and obesity. She presented to the ED at [facility] with SOB diarrhea x1 week, and cough. She was dx with COVID-19 one week prior to presentation to the ED.  8/9- tx from [facility], s/p VV ECMO cannulation 8/13- CT head with multiple areas of [place] 8/16- extubated, gastric Cortrak placed  8/22- start CRRT 8/30- trach 9/3- large vomiting episode, Cortrak advanced 2nd portion duodenum 9/6- stop CRRT, Cortrakcoiled in stomach 9/7- first iHD 9/9- CRRT restarted 9/10- G-J tube placed  Pt remains on vent support via trach, VV ECMO and CRRT.   Tolerating Pivot 1.5 at 65 ml/hr via J-tube, Pro-Source TF 45 mL QID  Experiencing nausea at times, G-tube to LIS with 700 mL bilious output in cannister. Noted reglan ordered, TF should continue   SLP working with pt with regards to inline PMV Pt has been working with PT, able to sit on side of bed and participate in some exercises but very weak  Pt remains on CRRT; pt has now been on CRRT for >3 weeks. Given prolonged CRRT, pt is at higher  risk for multiple micronutrient deficiencies given losses in effluent fluid. Plan to check labs today  Current vitamins ordered: Rena-Vite daily, Vitamin C 500 mg daily, Zinc 220 mg daily.   Current wt 113.2 kg; lowest BW 108.6 kg. Admit 133.6 kg. Net negative 8.5 L  Pt with unstageable PI to sacrum, stage II on back.. Multiple skin tears and MASD.   Phosphorus 2.1 (L), being supplemented  Labs: CBG 178-216 (ICU goal 140-180) Meds: ss novolog, levemir, novolog q 4 hours, tradjenta, reglan, Rena-Vit, zinc sulfate  Diet Order:   Diet Order    None      EDUCATION NEEDS:   No education needs have been identified at this time  Skin:  Skin Assessment: Skin Integrity Issues: Skin Integrity Issues:: Unstageable, Stage II Stage II: back Unstageable: sacrum Other: MASD- breast skin tears-face/chest/chin/buttocks  Last BM:  9/17  Height:   Ht Readings from Last 1 Encounters:  02/08/20 5' 4"  (1.626 m)    Weight:   Wt Readings from Last 1 Encounters:  02/22/20 113.2 kg    Ideal Body Weight:  54.5 kg  BMI:  Body mass index is 42.84 kg/m.  Estimated Nutritional Needs:   Kcal:  1610-9604 kcal  Protein:  170-210 grams  Fluid:  >/= 2 L/day   Kerman Passey MS, RDN, LDN, CNSC Registered Dietitian III Clinical Nutrition RD Pager and On-Call Pager Number Located in [place]

## 2020-02-22 NOTE — Progress Notes (Signed)
  Speech Language Pathology Treatment: Nada Boozer Speaking valve  Patient Details Name: Alisha Stephenson MRN: [id number] DOB: 07-09-1965 Today's Date: 02/22/2020 Time: 1000-1030 SLP Time Calculation (min) (ACUTE ONLY): 30 min  Assessment / Plan / Recommendation Clinical Impression  Pt seen for second trial with Inline PMSV. Pt alert and cooperative, eager to try any interventions, denies anxiety. Husband is a supportive presence at bedside. Initially, RT attempted to switch pt to PS/CPAP setting prior to any interventions with cuff or PMSV, but this resulted in dramatic increase of RR, so trial started on PCV mode. RT adjusted PEEP from 10 to 5, deflated cuff,  increased pressure above PEEP from 12 to 16, decreased RR from 15 to 10 and increased inspiratory sensitivity to 2. With PMSV in place, pt was able to increase inspiratory volumes with verbal cues to take a deep breath, hold and blow out. Given her ability to initiate breaths, noted by RT, decision was made to switch pt again to PS/CPAP mode with success. Pt sustained a RR in the 20s. She reported that she felt the air was "too fast" and so pressure above PEEP decreased from 12 to 10. Pt had notable decrease in autocycling with this change and reported increased comfort. The volume of her phonation decreased at word level, but when motivated to ask a question she did still have a nice strong breath to whisper "Are we going to keep working on this again?" Pt making great progress today, particularly in her awareness of breathing pattern and volitional and involuntary control of RR. Pts inspiratory volumes with PMSV in place reached 446 cc. May be beneficial to attempt IMST to progress pts RR coordination even further. Will continue efforts.    HPI HPI: 54 y/o female admitted on 8/2 with severe acute respiratory failure with hypoxemia due to COVID 19 pneumonia.  She developed symptoms 1 week prior to admission. Intubated 8/3, ECMO started 8/9, ICH on  8/13 Ct reads Multifocal parenchymal hemorrhages, with the largest within the right parietal lobe. Trach placed 8/30, PEG on 9/10. MD writes on 9/13 "ultra lung protective ventilation.  No trach collar trials as this may delay ECMO weaning trials.  Okay to try Passy-Muir trials on the vent."      SLP Plan  Continue with current plan of care       Recommendations         Patient may use Passy-Muir Speech Valve: with SLP only         Plan: Continue with current plan of care       GO               Herbie [place], [place] Pager [phone] Office [phone]  Lynann Beaver 02/22/2020, 12:22 PM

## 2020-02-22 NOTE — Plan of Care (Signed)
Problem: Education: Goal: Knowledge of General Education information will improve Description: Including pain rating scale, medication(s)/side effects and non-pharmacologic comfort measures Outcome: Progressing   Problem: Health Behavior/Discharge Planning: Goal: Ability to manage health-related needs will improve Outcome: Progressing   Problem: Clinical Measurements: Goal: Ability to maintain clinical measurements within normal limits will improve Outcome: Progressing Goal: Will remain free from infection Outcome: Progressing Goal: Diagnostic test results will improve Outcome: Progressing Goal: Respiratory complications will improve Outcome: Progressing Goal: Cardiovascular complication will be avoided Outcome: Progressing   Problem: Activity: Goal: Risk for activity intolerance will decrease Outcome: Progressing   Problem: Nutrition: Goal: Adequate nutrition will be maintained Outcome: Progressing   Problem: Coping: Goal: Level of anxiety will decrease Outcome: Progressing   Problem: Elimination: Goal: Will not experience complications related to bowel motility Outcome: Progressing Goal: Will not experience complications related to urinary retention Outcome: Progressing   Problem: Pain Managment: Goal: General experience of comfort will improve Outcome: Progressing   Problem: Safety: Goal: Ability to remain free from injury will improve Outcome: Progressing   Problem: Skin Integrity: Goal: Risk for impaired skin integrity will decrease Outcome: Progressing   Problem: Education: Goal: Knowledge of risk factors and measures for prevention of condition will improve Outcome: Progressing   Problem: Coping: Goal: Psychosocial and spiritual needs will be supported Outcome: Progressing   Problem: Respiratory: Goal: Will maintain a patent airway Outcome: Progressing Goal: Complications related to the disease process, condition or treatment will be avoided or  minimized Outcome: Progressing   Problem: Education: Goal: Knowledge of risk factors and measures for prevention of condition will improve Outcome: Progressing   Problem: Coping: Goal: Psychosocial and spiritual needs will be supported Outcome: Progressing   Problem: Respiratory: Goal: Will maintain a patent airway Outcome: Progressing Goal: Complications related to the disease process, condition or treatment will be avoided or minimized Outcome: Progressing   Problem: Education: Goal: Ability to describe self-care measures that may prevent or decrease complications (Diabetes Survival Skills Education) will improve Outcome: Progressing Goal: Individualized Educational Video(s) Outcome: Progressing   Problem: Coping: Goal: Ability to adjust to condition or change in health will improve Outcome: Progressing   Problem: Fluid Volume: Goal: Ability to maintain a balanced intake and output will improve Outcome: Progressing   Problem: Health Behavior/Discharge Planning: Goal: Ability to identify and utilize available resources and services will improve Outcome: Progressing Goal: Ability to manage health-related needs will improve Outcome: Progressing   Problem: Metabolic: Goal: Ability to maintain appropriate glucose levels will improve Outcome: Progressing   Problem: Nutritional: Goal: Maintenance of adequate nutrition will improve Outcome: Progressing Goal: Progress toward achieving an optimal weight will improve Outcome: Progressing   Problem: Skin Integrity: Goal: Risk for impaired skin integrity will decrease Outcome: Progressing   Problem: Tissue Perfusion: Goal: Adequacy of tissue perfusion will improve Outcome: Progressing   Problem: Education: Goal: Knowledge of risk factors and measures for prevention of condition will improve Outcome: Progressing   Problem: Coping: Goal: Psychosocial and spiritual needs will be supported Outcome: Progressing    Problem: Respiratory: Goal: Will maintain a patent airway Outcome: Progressing Goal: Complications related to the disease process, condition or treatment will be avoided or minimized Outcome: Progressing   Problem: Education: Goal: Knowledge of disease or condition will improve Outcome: Progressing Goal: Knowledge of secondary prevention will improve Outcome: Progressing Goal: Knowledge of patient specific risk factors addressed and post discharge goals established will improve Outcome: Progressing Goal: Individualized Educational Video(s) Outcome: Progressing   Problem: Coping: Goal: Will verbalize  positive feelings about self Outcome: Progressing Goal: Will identify appropriate support needs Outcome: Progressing   Problem: Health Behavior/Discharge Planning: Goal: Ability to manage health-related needs will improve Outcome: Progressing   Problem: Self-Care: Goal: Ability to participate in self-care as condition permits will improve Outcome: Progressing Goal: Verbalization of feelings and concerns over difficulty with self-care will improve Outcome: Progressing Goal: Ability to communicate needs accurately will improve Outcome: Progressing   Problem: Nutrition: Goal: Risk of aspiration will decrease Outcome: Progressing Goal: Dietary intake will improve Outcome: Progressing   Problem: Intracerebral Hemorrhage Tissue Perfusion: Goal: Complications of Intracerebral Hemorrhage will be minimized Outcome: Progressing   Problem: Ischemic Stroke/TIA Tissue Perfusion: Goal: Complications of ischemic stroke/TIA will be minimized Outcome: Progressing   Problem: Spontaneous Subarachnoid Hemorrhage Tissue Perfusion: Goal: Complications of Spontaneous Subarachnoid Hemorrhage will be minimized Outcome: Progressing

## 2020-02-22 NOTE — Procedures (Addendum)
Extracorporeal support note   ECLS support day: 38 (cannulated 01/30/2020) Indication: Covid 19 pneumonia/ARDS  Configuration: VV  Drainage cannula: right IJ [place]  Return cannula: same  Pump speed: 4100 Pump flow: 5.1 Pump used: Centrimag  Oxygenator: Quadrox O2 blender: 100% Sweep gas: 9  ABG    Component Value Date/Time   PHART 7.345 (L) 02/22/2020 1151   PCO2ART 53.5 (H) 02/22/2020 1151   PO2ART 63 (L) 02/22/2020 1151   HCO3 29.2 (H) 02/22/2020 1151   TCO2 31 02/22/2020 1151   ACIDBASEDEF 1.0 02/15/2020 1947   O2SAT 90.0 02/22/2020 1151    Circuit check: minimal visible clot- only fibrin in corners  Anticoagulant: Continue heparin Anticoagulation targets: goal heparin level 0.2  Changes in support: Con't aggressive PT, OT. Con't PMV trials with speech. cxr stable to slightly worse but no increase in vent settings or circult. Remains off pressors. Resume reglan with intolerance of tf overnight  Anticipated goals/duration of support: bridge to recovery  Audria Nine, MD 02/22/20 2:31 PM  Pulmonary & Critical Care

## 2020-02-22 NOTE — Progress Notes (Signed)
Patient ID: Alisha Stephenson, female   DOB: [birth year], 54 y.o.   MRN: [id number]    Advanced Heart Failure Rounding Note   Subjective:    - 8/2 COVID + test - 8/9 Cannulated for VV ECMO - 8/13 with several areas of intracranial hemorrhage. Bival stopped.  - 8/14 CT no change in [place]. Increased edema - 8/16 Extubated - 8/16 Head CT stable bleed - 8/22 CVVHD started - 8/24 reintubated - 8/24 circuit changed - 8/30 trach - 9/7  Switched to iHD - 02/15/20 Switched back to CVVHD - 9/10 GJ tube placed  Remains awake on vent via trach. Following commands and able to move more, working with PT/OT daily. Speech following as well for PMV.   Remains on CVVHD. Remains anuric. Pulling -25 to -50/h on CVVHD. Had chugging this am. Given 1u RBCs.   Remains off pressors. On po midodrine. Wide pulse pressure. No AI on echo.   CXR looks slightly worse this am. Personally reviewed   ECMO  Flow 5.15 L RPM 4300 Sweep9  Labs: 7.38/46/53/86% 40% FiO2 TV 200cc Hgb7.8 -> 1u RBCs PLT 102k LDH 315 -> 374 -> 383 -> 331 Lactic acid1.6   Objective:   Weight Range:  Vital Signs:   Temp:  [97.8 F (36.6 C)] 97.8 F (36.6 C) (09/16 0400) Pulse Rate:  [92-102] 102 (09/16 0700) Resp:  [20-28] 28 (09/16 0511) BP: (97-112)/(37-69) 97/39 (09/16 0511) SpO2:  [87 %-99 %] 92 % (09/16 0700) Arterial Line BP: (84-146)/(34-55) 104/44 (09/16 0700) FiO2 (%):  [40 %] 40 % (09/16 0511) Weight:  [113.2 kg] 113.2 kg (09/16 0410) Last BM Date: 02/21/20  Weight change: Filed Weights   02/20/20 0500 02/21/20 0500 02/22/20 0410  Weight: 110.8 kg 115.9 kg 113.2 kg    Intake/Output:   Intake/Output Summary (Last 24 hours) at 02/22/2020 0758 Last data filed at 02/22/2020 0700 Gross per 24 hour  Intake 2307.08 ml  Output 2838 ml  Net -530.92 ml     Physical Exam: General:  Lying in bed  No resp difficulty HEENT: normal Neck: supple.RIJ ecmo. LIJ trialysis. Carotids 2+ bilat; no bruits. No  lymphadenopathy or thryomegaly appreciated. Cor: PMI nondisplaced. Regular rate & rhythm. No rubs, gallops or murmurs. Lungs: coarse Abdomen: obese soft, nontender, nondistended. No hepatosplenomegaly. No bruits or masses. Good bowel sounds. Extremities: no cyanosis, clubbing, rash, tr edema R 1st/2nd fingers with necrotic tips  Neuro: alert & orientedx3, cranial nerves grossly intact. Weak throughout   Telemetry: Sinus 90-105 Personally reviewed   Labs: Basic Metabolic Panel: Recent Labs  Lab 02/18/20 0400 02/18/20 0416 02/19/20 0335 02/19/20 0350 02/19/20 1700 02/20/20 0232 02/20/20 0233 02/20/20 0552 02/20/20 1545 02/20/20 1545 02/20/20 1618 02/20/20 1628 02/21/20 0257 02/21/20 0535 02/21/20 1558 02/21/20 1712 02/21/20 1957 02/22/20 0422 02/22/20 0436  NA 137   < > 137   < >   < >  --  135   < > 136   < > 135   < > 134*   < > 136 139 137 136 138  K 3.7   < > 3.7   < >   < >  --  3.6   < > 3.9   < > 4.0   < > 4.1   < > 4.1 4.4 4.6 4.1 4.0  CL 103   < > 103   < >   < >  --  100   < > 101  --  100  --  101  --  102  --   --  102  --   CO2 25   < > 26   < >   < >  --  25   < > 24  --  24  --  24  --  25  --   --  25  --   GLUCOSE 167*   < > 176*   < >   < >  --  172*   < > 188*  --  197*  --  184*  --  166*  --   --  198*  --   BUN 14   < > 15   < >   < >  --  20   < > 21*  --  22*  --  27*  --  27*  --   --  26*  --   CREATININE 1.12*   < > 0.93   < >   < >  --  0.86   < > 0.98  --  0.94  --  1.03*  --  0.99  --   --  0.93  --   CALCIUM 8.2*   < > 8.2*   < >   < >  --  8.3*   < > 8.4*   < > 8.5*   < > 8.3*  --  8.5*  --   --  8.4*  --   MG 2.6*  --  2.5*  --   --  2.5*  --   --   --   --   --   --  2.6*  --   --   --   --  2.7*  --   PHOS 2.2*   < > 1.3*   < >   < >  --  1.9*  --  3.8  --   --   --  3.1  --  2.2*  --   --  2.4*  --    < > = values in this interval not displayed.    Liver Function Tests: Recent Labs  Lab 02/20/20 0233 02/20/20 1545 02/21/20 0257  02/21/20 1558 02/22/20 0422  ALBUMIN 2.5* 2.7* 2.7* 2.9* 3.0*   No results for input(s): LIPASE, AMYLASE in the last 168 hours. No results for input(s): AMMONIA in the last 168 hours.  CBC: Recent Labs  Lab 02/20/20 0232 02/20/20 0552 02/20/20 1618 02/20/20 1628 02/21/20 0257 02/21/20 0535 02/21/20 1558 02/21/20 1712 02/21/20 1957 02/22/20 0422 02/22/20 0436  WBC 7.5  --  10.2  --  8.9  --  9.3  --   --  6.9  --   HGB 8.9*   < > 9.6*   < > 8.5*   < > 8.7* 8.8* 8.8* 7.8* 8.5*  HCT 29.4*   < > 31.2*   < > 28.3*   < > 28.5* 26.0* 26.0* 26.3* 25.0*  MCV 107.7*  --  108.3*  --  107.6*  --  108.8*  --   --  109.6*  --   PLT 113*  --  121*  --  115*  --  108*  --   --  102*  --    < > = values in this interval not displayed.    Cardiac Enzymes: No results for input(s): CKTOTAL, CKMB, CKMBINDEX, TROPONINI in the last 168 hours.  BNP: BNP (last 3 results) No results for input(s): BNP in the last 8760 hours.  ProBNP (last 3 results) No results for input(s): PROBNP in the last 8760 hours.    Other results:  Imaging: DG CHEST PORT 1 VIEW  Result Date: 02/21/2020 CLINICAL DATA:  Respiratory failure. EXAM: PORTABLE CHEST 1 VIEW COMPARISON:  February 20, 2020. FINDINGS: Stable cardiomegaly. Tracheostomy is unchanged in position. ECMO device is unchanged. Left internal jugular catheter is unchanged. Stable bilateral lung opacities are noted consistent with multifocal pneumonia. Small pleural effusions may be present. No pneumothorax is noted. Bony thorax is unremarkable. IMPRESSION: Stable support apparatus. Stable bilateral lung opacities consistent with multifocal pneumonia. Electronically Signed   By: Marijo Conception M.D.   On: 02/21/2020 08:25     Medications:     Scheduled Medications: . sodium chloride   Intravenous Once  . amiodarone  200 mg Per Tube Daily  . vitamin C  500 mg Per Tube Daily  . chlorhexidine gluconate (MEDLINE KIT)  15 mL Mouth Rinse BID  .  Chlorhexidine Gluconate Cloth  6 each Topical Q0600  . clonazePAM  1 mg Per Tube Q6H  . collagenase   Topical Daily  . docusate  100 mg Per Tube BID  . feeding supplement (PROSource TF)  45 mL Per Tube QID  . Gerhardt's butt cream   Topical BID  . influenza vac split quadrivalent PF  0.5 mL Intramuscular Tomorrow-1000  . insulin aspart  0-15 Units Subcutaneous Q4H  . insulin aspart  6 Units Subcutaneous Q4H  . insulin detemir  30 Units Subcutaneous BID  . linagliptin  5 mg Per Tube Daily  . lipase/protease/amylase)  20,880 Units Per Tube Once   And  . sodium bicarbonate  650 mg Per Tube Once  . mouth rinse  15 mL Mouth Rinse 10 times per day  . melatonin  9 mg Per Tube QHS  . metoCLOPramide (REGLAN) injection  5 mg Intravenous Q8H  . midodrine  10 mg Per Tube TID  . multivitamin  1 tablet Per Tube QHS  . oxyCODONE  10 mg Per Tube Q6H  . pantoprazole (PROTONIX) IV  40 mg Intravenous BID  . pneumococcal 23 valent vaccine  0.5 mL Intramuscular Tomorrow-1000  . polyethylene glycol  17 g Per Tube Daily  . QUEtiapine  100 mg Per Tube QHS  . sennosides  5 mL Per Tube BID  . zinc sulfate  220 mg Per Tube Daily    Infusions: .  prismasol BGK 4/2.5 400 mL/hr at 02/21/20 2335  .  prismasol BGK 4/2.5 200 mL/hr at 02/21/20 0101  . sodium chloride 500 mL (02/11/20 2215)  . sodium chloride 10 mL/hr at 02/22/20 0700  . sodium chloride    . sodium chloride    . albumin human 12.5 g (02/21/20 2225)  . albumin human Stopped (02/06/20 2000)  . feeding supplement (PIVOT 1.5 CAL) 65 mL/hr at 02/22/20 0700  . heparin 10,000 units/ 20 mL infusion syringe 500 Units/hr (02/21/20 1932)  . heparin 800 Units/hr (02/22/20 0700)  . norepinephrine (LEVOPHED) Adult infusion Stopped (02/17/20 1948)  . phenylephrine (NEO-SYNEPHRINE) Adult infusion Stopped (02/13/20 0155)  . prismasol BGK 4/2.5 1,500 mL/hr at 02/22/20 0456  . sodium phosphate  Dextrose 5% IVPB 20 mmol (02/22/20 0722)    PRN  Medications: sodium chloride, sodium chloride, sodium chloride, sodium chloride, acetaminophen (TYLENOL) oral liquid 160 mg/5 mL, albumin human, albuterol, alteplase, alum & mag hydroxide-simeth, dextrose, fentaNYL (SUBLIMAZE) injection, guaiFENesin-dextromethorphan, heparin, heparin, heparin, hydrALAZINE, HYDROmorphone (DILAUDID) injection, hydrOXYzine, lidocaine (PF), lidocaine-prilocaine, ondansetron **OR** ondansetron (ZOFRAN) IV, pentafluoroprop-tetrafluoroeth  Assessment/Plan:   1. Acute hypoxic/hypercapneic respiratory failure in setting of severe COVID PNA/ARDS -> VV ECMO - admit 8/2 - intubation 8/3 - has received actmera (compelted 8/2), remdesivir (completed 8/6) and steroids - Cannulated for VV ECMO on 8/9 - Extubated 8/16. Reintubated 8/24 - s/p trach 8/30. - Circuit changed 8/24 due to concern for hemolysis and worsening oxygenation. - CVVHD started 8/22 for volume removal and uremia. Remains anuric. CXR worse on 9/9 so switched back to CVVHD.  Running at -50/hr and having chugging. Given RBCs. However CXR looks slightly worse today. Keep pull rate -25 to -50 as tolerated by circuit - Off IV sedation. Continue melatonin and seroquel for sleep at night. Adjusted to help with restlessness and sundowning at night - Off pressors and on midodrine 10 tid. Has wide pulse pressure but no AI on echo.  - Lung function returning very slowly. Sweep still high. Continue to try and wean slowly.  - Main issues remain to be poor lung recovery, critical care myopathy and AKI. Continue slow wean as tolerated. Need to prioritize ECMO wean then vent wean.  - Continue heparin with level 0.2-0.3 Discussed dosing with PharmD personally.   2. Enterococcus sepsis - Finished high-dose zosyn on 9/7 per ID - Eraxis added on 8/26 with yeast in BAL 8/24 (ended 9/2)  - Cultures repeated with increasing pressor demands - F/u cx 9/7 Remain NGTD.  - No change today  3. Intracranial hemorrhage - ? Septic  emboli - repeat head CT on 8/14 with stable bleeds but increased edema - neurology has seen. Suspect significant long-term injury sustained. Will follow commands. Appears to have dense LUE weakness and possibly LLE - repeat head CT stable 8/16 - tolerating heparin. Nochange  4. Thrombocytopenia - PLTs 100-120k   - Continue to follow.  - Stable   5. Morbid obesity - Body mass index is Body mass index is 42.84 kg/m.  6. Poorly controlled DM2 - HgBA1c 10.7 - On insulin gtt - IV insulin converted to SQ insulin on 9/15  7. PAF - intermittent episodes. Last 8/26 - Now on po. Quiescent  8. AKI/azotemia - CVVHD started on 8/22 - Down 70 pounds.  - transitioned to [facility] on 9/7 - switched back to CVVHD for better volume removal on 9/9. - new catheter placed 9/9 - will eventually need tunneled access - Keep UF rate -25 to - 50 as above  9. Emesis - Cor-trak out.  - Has GJ tube now, slowly increase TFs.   10. Sacral decub  - Wound care following  11. Necrotic fingers - VVS following - suspect will auto amputate  12. Anemia - Transfuse hgb < 8 - Given 1u this am   CRITICAL CARE Performed by: Glori Bickers  Total critical care time: 35 minutes  Critical care time was exclusive of separately billable procedures and treating other patients.  Critical care was necessary to treat or prevent imminent or life-threatening deterioration.  Critical care was time spent personally by me (independent of midlevel providers or residents) on the following activities: development of treatment plan with patient and/or surrogate as well as nursing, discussions with consultants, evaluation of patient's response to treatment, examination of patient, obtaining history from patient or surrogate, ordering and performing treatments and interventions, ordering and review of laboratory studies, ordering and review of radiographic studies, pulse oximetry and re-evaluation of patient's  condition.   Length of Stay: [place]  MD 02/22/2020, 7:58 AM  Advanced Heart Failure Team  Pager [phone] (M-F; [place])  Please contact [facility] for night-coverage after hours (4p -7a ) and weekends on [email or web address]

## 2020-02-22 NOTE — Progress Notes (Signed)
[place] for Heparin Indication: ECMO  Allergies  Allergen Reactions  . Sulfa Antibiotics Rash and Other (See Comments)    Patient Measurements: Height: 5\' 4"  (162.6 cm) Weight: 113.2 kg (249 lb 9 oz) IBW/kg (Calculated) : 54.7 Heparin Dosing Weight: 87 kg  Vital Signs: Temp: 98.3 F (36.8 C) (09/16 1600) Temp Source: Oral (09/16 1600) Pulse Rate: 100 (09/16 1700)  Labs: Recent Labs    02/21/20 1558 02/21/20 1712 02/22/20 0422 02/22/20 0436 02/22/20 1151 02/22/20 1151 02/22/20 1609 02/22/20 1624  HGB 8.7*   < > 7.8*   < > 10.2*   < > 9.7* 10.5*  HCT 28.5*   < > 26.3*   < > 30.0*  --  32.1* 31.0*  PLT 108*  --  102*  --   --   --  101*  --   APTT 94*  --  80*  --   --   --  77*  --   HEPARINUNFRC 0.23*  --  0.14*  --   --   --  0.16*  --   CREATININE 0.99  --  0.93  --   --   --  0.94  --    < > = values in this interval not displayed.    Estimated Creatinine Clearance: 83.4 mL/min (by C-G formula based on SCr of 0.94 mg/dL).  Assessment: 54 yo female COVID+ on VV ECMO. Patient started on bivalirudin but was found to have multiple ICH on 8/13 head CT. Bivalirudin was stopped and pt was off anticoagulation at that time. Her ECMO circuit was changed 8/24. Pharmacy asked to start low-fixed rate heparin. Heparin infusion was held last week due to bleeding from trach site. Heparin was started at low fixed rate for pump patency in setting of ICH. Concern with increased  clotting off crrt so will start to titrate heparin to a low goal of  (~0.15-0.2 heparin levels).  Hep level within goal  Goal of Therapy:  HL 0.15-0.2  Monitor platelets by anticoagulation protocol: Yes   Plan:  Continue heparin 850 units/hr q12h heparin level, CBC  Barth Kirks, PharmD, BCPS, BCCCP Clinical Pharmacist [phone]  Please check AMION for all [place] numbers  02/22/2020 7:32 PM

## 2020-02-22 NOTE — Progress Notes (Addendum)
[facility] Progress Note    Assessment/ Plan:    86F severe MSOF from COVID-19 pneumonia requiring VV ECMO.  1.  Anuric AKI due to shock, sepsis causing acute tubular necrosis:CRRT on 8/22- 02/12/20. IHD 02/13/20.  Planned for 9/9 but back to CRRT d/t fluid overload.  L IJ nontunneled catheter changed 9/9 from L [place].   - Continue CRRT.  On All 4K.  Reduced dialysate to 1.5 L/hr from 2 on 9/13. - UF goal net neg 50 mL/ hr as tolerated    - Note renal added flat rate heparin 500 u/ hr to circuit 02/18/20 d/t clotting x 2.    2.Shock due to sepsis, Covid infection, blood cultures positive for Enterococcus bacteremia: s/p Zosyn (ended 9/7), cultures repeated 9/7, NGTD.  TTE 9/7 no AI, done for concern of wide pulse pressure  3.Acute hypoxic and hypercapnic respiratory failure, ARDS due to COVID-19 pneumonia: on VV ECMO for refractory hypoxemia.  3.Hypernatremia, hypervolemia: relative free water deficit. Improved with CRRT.  4.Acute toxic metabolic encephalopathy:Multifactorial, improving  5.Anemia, thrombocytopenia: With hemolysis likely and acute illness contributing. Transfusions per primary team.  6.Intracranial hemorrhage: With left-sided weakness, ? Septic emboli  7.  Diabetes per primary  8.  Hypophosphatemia repleting as needed; repletion again on 9/16 am   9.  Critical illness myopathy: PT as able per primary team    Subjective:    Seen and examined on CRRT.  Goal of net neg 50 an hour as tolerated but has been limited by chugging with the ECMO.   No urine output charted.  Had 2 L UF with CRRT over 9/15.   Review of systems  Denies Shortness of breath /no air hunger Denies chest pain  Had nausea earlier and had 500 ml out of NG tube   Objective:   BP (!) 97/39    Pulse 99    Temp 97.8 F (36.6 C) (Oral)    Resp (!) 28    Ht _0  (1.626 m)    Wt 113.2 kg    SpO2 90%    BMI 42.84 kg/m   Intake/Output Summary (Last 24 hours) at 02/22/2020  5638 Last data filed at 02/22/2020 0600 Gross per 24 hour  Intake 2313.58 ml  Output 2769 ml  Net -455.42 ml   Weight change: -2.7 kg  Physical Exam:   Gen: critically ill, lying in bed in NAD  NECK: R IJ ECMO access CVS: RRR Resp: reduced on auscultation  Abd: obese Ext: trace edema bilaterally ACCESS: L IJ nontunneled HD catheter MSK: necrotic thumb and 1st finger R hand Psych no anxiety or agitation Neuro - alert and oriented - provides hx and follows commands  Imaging: DG CHEST PORT 1 VIEW  Result Date: 02/21/2020 CLINICAL DATA:  Respiratory failure. EXAM: PORTABLE CHEST 1 VIEW COMPARISON:  February 20, 2020. FINDINGS: Stable cardiomegaly. Tracheostomy is unchanged in position. ECMO device is unchanged. Left internal jugular catheter is unchanged. Stable bilateral lung opacities are noted consistent with multifocal pneumonia. Small pleural effusions may be present. No pneumothorax is noted. Bony thorax is unremarkable. IMPRESSION: Stable support apparatus. Stable bilateral lung opacities consistent with multifocal pneumonia. Electronically Signed   By: Marijo Conception M.D.   On: 02/21/2020 08:25    Labs: BMET Recent Labs  Lab 02/19/20 0335 02/19/20 0350 02/19/20 1641 02/19/20 1641 02/19/20 1700 02/19/20 1756 02/20/20 0233 02/20/20 0552 02/20/20 1545 02/20/20 1545 02/20/20 1618 02/20/20 1628 02/21/20 0257 02/21/20 0535 02/21/20 1304 02/21/20 1512 02/21/20 1558 02/21/20  1712 02/21/20 1957 02/22/20 0422 02/22/20 0436  NA 137   < > 134*   < > 136   < > 135   < > 136   < > 135   < > 134*   < > 137 138 136 139 137 136 138  K 3.7   < > 4.1   < > 4.1   < > 3.6   < > 3.9   < > 4.0   < > 4.1   < > 4.5 4.2 4.1 4.4 4.6 4.1 4.0  CL 103   < > 99   < > 101  --  100  --  101  --  100  --  101  --   --   --  102  --   --  102  --   CO2 26   < > 25   < > 26  --  25  --  24  --  24  --  24  --   --   --  25  --   --  25  --   GLUCOSE 176*   < > 188*   < > 187*  --  172*  --   188*  --  197*  --  184*  --   --   --  166*  --   --  198*  --   BUN 15   < > 18   < > 18  --  20  --  21*  --  22*  --  27*  --   --   --  27*  --   --  26*  --   CREATININE 0.93   < > 0.86   < > 0.91  --  0.86  --  0.98  --  0.94  --  1.03*  --   --   --  0.99  --   --  0.93  --   CALCIUM 8.2*   < > 8.1*   < > 8.1*  --  8.3*  --  8.4*  --  8.5*  --  8.3*  --   --   --  8.5*  --   --  8.4*  --   PHOS 1.3*  --  3.5  --   --   --  1.9*  --  3.8  --   --   --  3.1  --   --   --  2.2*  --   --  2.4*  --    < > = values in this interval not displayed.   CBC Recent Labs  Lab 02/20/20 1618 02/20/20 1628 02/21/20 0257 02/21/20 0535 02/21/20 1558 02/21/20 1558 02/21/20 1712 02/21/20 1957 02/22/20 0422 02/22/20 0436  WBC 10.2  --  8.9  --  9.3  --   --   --  6.9  --   HGB 9.6*   < > 8.5*   < > 8.7*   < > 8.8* 8.8* 7.8* 8.5*  HCT 31.2*   < > 28.3*   < > 28.5*   < > 26.0* 26.0* 26.3* 25.0*  MCV 108.3*  --  107.6*  --  108.8*  --   --   --  109.6*  --   PLT 121*  --  115*  --  108*  --   --   --  102*  --    < > = values in  this interval not displayed.    Medications:     amiodarone  200 mg Per Tube Daily   vitamin C  500 mg Per Tube Daily   chlorhexidine gluconate (MEDLINE KIT)  15 mL Mouth Rinse BID   Chlorhexidine Gluconate Cloth  6 each Topical Q0600   clonazePAM  1 mg Per Tube Q6H   collagenase   Topical Daily   docusate  100 mg Per Tube BID   feeding supplement (PROSource TF)  45 mL Per Tube QID   Gerhardt's butt cream   Topical BID   influenza vac split quadrivalent PF  0.5 mL Intramuscular Tomorrow-1000   insulin aspart  0-15 Units Subcutaneous Q4H   insulin aspart  6 Units Subcutaneous Q4H   insulin detemir  30 Units Subcutaneous BID   linagliptin  5 mg Per Tube Daily   lipase/protease/amylase)  20,880 Units Per Tube Once   And   sodium bicarbonate  650 mg Per Tube Once   mouth rinse  15 mL Mouth Rinse 10 times per day   melatonin  9 mg Per Tube QHS    midodrine  10 mg Per Tube TID   multivitamin  1 tablet Per Tube QHS   oxyCODONE  10 mg Per Tube Q6H   pantoprazole (PROTONIX) IV  40 mg Intravenous BID   pneumococcal 23 valent vaccine  0.5 mL Intramuscular Tomorrow-1000   polyethylene glycol  17 g Per Tube Daily   QUEtiapine  100 mg Per Tube QHS   sennosides  5 mL Per Tube BID   zinc sulfate  220 mg Per Tube Daily     Claudia Desanctis MD 02/22/2020, 6:28 AM

## 2020-02-22 NOTE — Progress Notes (Signed)
Physical Therapy Treatment Patient Details Name: Alisha Stephenson MRN: [id number] DOB: 09/17/1965 Today's Date: 02/22/2020    History of Present Illness Pt is 54 yo admitted 8/2 with hypoxemia due to Covid 19 PNA. 8/3 intubated. 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 CRRT 8/22-9/6, restarted 9/9. Reintubated 8/24 with ECMO circuit changed, 8/30 trach. 9/10 PEG tube. PMHx: DM, diverticulitis, Asthma    PT Comments    Pt tolerated transfer well.  Per staff she is more lethargic than prior treatments.  She was able to sit at EOB with mod-max A for 5 mins and participated with exercises.  Demonstrating initiation of exercises on both sides but remains very weak and required assist to complete ROM.  Pt's vitals were stable throughout treatment.  Cont POC.      Follow Up Recommendations  LTACH;Supervision/Assistance - 24 hour     Equipment Recommendations  Other (comment) (TBD)    Recommendations for Other Services       Precautions / Restrictions Precautions Precautions: Other (comment) Precaution Comments: ECMO with RIJ access anchors at right shoulder and chest, right necrotic thumb and index finger, bruised/necrotic left hand, trach, vent, CRRT, PEG Required Braces or Orthoses: Other Brace Other Brace: B PRAFO    Mobility  Bed Mobility Overal bed mobility: Needs Assistance Bed Mobility: Supine to Sit;Sit to Supine Rolling: Total assist;+2 for physical assistance   Supine to sit: +2 for physical assistance;Total assist Sit to supine: +2 for physical assistance;Total assist   General bed mobility comments: Transferred to/from EOB with total A of 2 for trunk and legs , plus 2 RNs into assist with CRRT and ECMO lines  Transfers                    Ambulation/Gait                 Stairs             Wheelchair Mobility    Modified Rankin (Stroke Patients Only) Modified Rankin (Stroke Patients  Only) Pre-Morbid Rankin Score: No symptoms Modified Rankin: Severe disability     Balance Overall balance assessment: Needs assistance Sitting-balance support: Bilateral upper extremity supported Sitting balance-Leahy Scale: Zero Sitting balance - Comments: supported sitting at EOB x 5 minutes with max-mod assist of 2. Positioned bil UE for propping.       Standing balance comment: unable                            Cognition Arousal/Alertness: Awake/alert Behavior During Therapy: WFL for tasks assessed/performed Overall Cognitive Status: Difficult to assess                                 General Comments: Pt with limited communication due to tracheostomy.  She was able to respond to yes/no questions and communicated with some lip reading. Followed simple commands as able.   Spouse was present.      Exercises General Exercises - Upper Extremity Shoulder Flexion: AAROM;Left;10 reps (pt able to initiate but PT completed) Elbow Flexion: AAROM;Both;10 reps;Supine Elbow Extension: AAROM;Both;Supine;10 reps General Exercises - Lower Extremity Ankle Circles/Pumps: AAROM;Supine;Both;10 reps (with facilitation techniques) Quad Sets: AAROM;Both;5 reps;Supine Long Arc Quad: AAROM;Both;5 reps;Seated Other Exercises Other Exercises: Bil PF stretch 3 x 30 sec    General Comments General comments (skin integrity, edema, etc.): VSS throughout session, RNs  present to assist with lines and monitoring vitals; pt with trach to vent with 40% FiO2      Pertinent Vitals/Pain Pain Assessment: Faces Faces Pain Scale: Hurts little more Pain Location: generalized with movement Pain Descriptors / Indicators: Grimacing;Sore Pain Intervention(s): Limited activity within patient's tolerance;Monitored during session;Repositioned    Home Living                      Prior Function            PT Goals (current goals can now be found in the care plan section)  Acute Rehab PT Goals Patient Stated Goal: per husband, to see her dog [name] PT Goal Formulation: With family Time For Goal Achievement: 03-31-2020 Potential to Achieve Goals: Fair Progress towards PT goals: Progressing toward goals    Frequency    Min 3X/week      PT Plan Current plan remains appropriate    Co-evaluation              AM-PAC PT "6 Clicks" Mobility   Outcome Measure  Help needed turning from your back to your side while in a flat bed without using bedrails?: Total Help needed moving from lying on your back to sitting on the side of a flat bed without using bedrails?: Total Help needed moving to and from a bed to a chair (including a wheelchair)?: Total Help needed standing up from a chair using your arms (e.g., wheelchair or bedside chair)?: Total Help needed to walk in hospital room?: Total Help needed climbing 3-5 steps with a railing? : Total 6 Click Score: 6    End of Session Equipment Utilized During Treatment: Oxygen Activity Tolerance: Patient tolerated treatment well Patient left: in bed;with family/visitor present;Other (comment);with SCD's reapplied (pillows under L side, supporting extremities, and by R shoulder for posture; PRAFOs in place) Nurse Communication: Mobility status PT Visit Diagnosis: Other abnormalities of gait and mobility (R26.89);Muscle weakness (generalized) (M62.81)     Time: 8115-7262 PT Time Calculation (min) (ACUTE ONLY): 33 min  Charges:  $Therapeutic Exercise: 8-22 mins $Therapeutic Activity: 8-22 mins                     Abran Richard, PT Acute Rehab Services Pager [phone] [facility] [phone]     Karlton Lemon 02/22/2020, 3:40 PM

## 2020-02-22 NOTE — Progress Notes (Addendum)
NAME:  Alisha Stephenson, MRN:  [id number], DOB:  [birth year], LOS: 93 ADMISSION DATE:  01/26/2020, CONSULTATION DATE:  8/3 REFERRING MD:  Alfredia Ferguson, CHIEF COMPLAINT:  Dyspnea   Brief History   54 y/o female admitted on 8/2 with severe acute respiratory failure with hypoxemia due to COVID 19 pneumonia.  She developed symptoms 1 week prior to admission.  Past Medical History  DM2 Diverticulitis Gallstones Ovarian cyst NAFLD Asthma  Significant Hospital Events   8/2 admit 8/3 ICU transfer, intubated 8/4 prone, paralyze 8/9 significant desaturations today 8/9 VV ECMO cannulation 8/13 ICH 8/30 trach  Consults:  PCCM ECMO team  Procedures:  8/3 ETT > 8/30 8/30 8-0 shiley trach 8/3 PICC >  8/9 LIJ MML 8/9 RIJ [place] 84F  9/8 trialysis 9/10 PEG-J (IR)  Significant Diagnostic Tests:  7/31 CT head > NAICP 7/31 MRI/MRA brain > no acute changes, possibly small aneurysm ACOM 8/13 CT head> multiple areas of ICH   Micro Data:  8/2 blood > NG 8/2 SARS COV 2 > positive 8/4 resp > negative 8/4 urine >  8/12 blood > E. faecalis (pan-sensitive) 8/12 resp: staph aureus> MSSA 8/25 blood>> NG 8/24 BAL> yeast  Antimicrobials:  See fever tab for past abx Current: none  Interim history/subjective:  Working well with PT sat on edge of bed later in day. Weaning sweep slowly. Vent settings stable. Tolerating net -50 to 100 on uf. Off sedation Stopped tf overnight with intolerance. Appears reglan fell off mar. Will resume to help with gi motility (uncontrolled dm2 10.7 a1c) Objective   Blood pressure (!) 97/39, pulse 100, temperature 98.2 F (36.8 C), temperature source Oral, resp. rate (!) 25, height _0  (1.626 m), weight 113.2 kg, SpO2 94 %.    Vent Mode: PCV FiO2 (%):  [40 %] 40 % Set Rate:  [15 bmp] 15 bmp PEEP:  [10 cmH20] 10 cmH20 Plateau Pressure:  [18 cmH20-27 cmH20] 18 cmH20   Intake/Output Summary (Last 24 hours) at 02/22/2020 1431 Last data filed at 02/22/2020  1300 Gross per 24 hour  Intake 3090.17 ml  Output 3253 ml  Net -162.83 ml   Filed Weights   02/20/20 0500 02/21/20 0500 02/22/20 0410  Weight: 110.8 kg 115.9 kg 113.2 kg    Examination:  GEN: ill appearing woman reclining comfortably, awake and interactive HEENT: [place]/AT, eyes anicteric, oral mucosa moist. Neck: trach, RIJ ECMO cannulas CV: RRR, no murmur PULM: CTAB, Vt 200cc GI: obese, soft, NT, PEG in place without erythema, some dried blood around stoma. EXT: minimal edema, dry gangrene of distal 2 R digits. NEURO: awake and alert, able to weakly move LUE, globally weak but able to move RUE and legs on command. Attempting to answer questions, but not mouthing words clearly PSYCH: motivated, interactive. Derm: gangrenous digits, no rashes.  Pressure Injury 02/14/20 Anus Medial Deep Tissue Pressure Injury - Purple or maroon localized area of discolored intact skin or blood-filled blister due to damage of underlying soft tissue from pressure and/or shear. (Active)  02/14/20 1300  Location: Anus  Location Orientation: Medial  Staging: Deep Tissue Pressure Injury - Purple or maroon localized area of discolored intact skin or blood-filled blister due to damage of underlying soft tissue from pressure and/or shear.  Wound Description (Comments):   Present on Admission: No     Pressure Injury Back Left;Mid Stage 2 -  Partial thickness loss of dermis presenting as a shallow open injury with a red, pink wound bed without slough. in skin fold  on back (Active)     Location: Back  Location Orientation: Left;Mid  Staging: Stage 2 -  Partial thickness loss of dermis presenting as a shallow open injury with a red, pink wound bed without slough.  Wound Description (Comments): in skin fold on back  Present on Admission: No     Pressure Injury 02/21/20 Sacrum Posterior Unstageable - Full thickness tissue loss in which the base of the injury is covered by slough (yellow, tan, gray, green or  brown) and/or eschar (tan, brown or black) in the wound bed. Unstageable Sacral Wound (Active)  02/21/20 1206  Location: Sacrum  Location Orientation: Posterior  Staging: Unstageable - Full thickness tissue loss in which the base of the injury is covered by slough (yellow, tan, gray, green or brown) and/or eschar (tan, brown or black) in the wound bed.  Wound Description (Comments): Unstageable Sacral Wound  Present on Admission: No     ABG paO2 63 co2 53 BMP stable on CRRT CBC stable Fibrinogen 547>>571>>481>>464>  431 >416>418>436> 415->411 LDH 489>>401>>376>>326> 321> 303> 315> 383->331 LA 1. 6 Heparin level 0. 1 6 PTT 69 Net - 455  Vent rate 15, pressure control 12, PEEP 10, 40% FiO2 with tidal volumes about 200 cc  CXR personally reviewed: relatively stable lung aeration Resolved Hospital Problem list   MSSA pneumonia Fungal pneumonia- Candida dublinensis Enterococcal bacteremia-recent blood cultures cleared Concern for possible embolic phenomenon- fingers and possibly CVAs were embolic  Assessment & Plan:    Critically ill due to acute hypoxic and AoC hypercapneic respiratory failure; likely underly OHS.  Requiring VV ECMO support and mechanical ventilation.  ARDS due to COVID 19 pneumonia Tracheostomy in place -Con't VV ECMO support, con't weaning sweep; not yet ready for sweep trials. Prioritizing ECMO weaning over TC trials given concern for derecruitment.  -Con't ultra-lung protective ventilation. Decreasing PEEP during inline PMV trials, otherwise want to optimize recruitment. -VAP prevention protocol -off sedation for >1 week now -Routine tracheostomy care -off all antibiotics; monitoring for development of infections  Elevated LDH, hypo-fibrinogenemia and thrombocytopenia--improved since circuit change. -Con't routine ECMO labs  ICH, multifocal. L-sided weakness-- improving .  Critical illness myopathy--improving slowly. -Continue PT, OT, SLP.  Very  motivated to participate and showing progress -Her husband has been very involved in her ongoing care and works with her regularly on range of motion and conditioning exercises.  Acute metabolic encephalopathy, improving Sundowning in the evenings- better last night -Continue minimizing sedation as much as possible.  Promote appropriate sleep wake cycles, avoid deliria genic medications -Mobility is much as feasible -Continue family visitation as much as possible -Con't higher dose seroquel QHS -recheck qtc today.   Acute renal failure, severe uremia.  Anuric. -Continue CVVHD.  Appreciate nephrology assistance.  Goal net -50cc/h to -75cc -Renally dose medications and avoid nephrotoxic meds -Bladder scan every shift to monitor for return of renal function  Hypotension. Acute cor pulmonale resolved. Possibly related to critical illness. -Continue midodrine 50m 3 times daily. Wide pulse pressure precludes weaning this.  Hyperglycemia > not controlled at all with [place] insulin despite aggressive upward titration.  Assume poor absorption, potentially due to subcutaneous edema.   -subcut insulin  Best practice:  Diet: tube feeding Pain/Anxiety/Delirium protocol (if indicated): as above VAP protocol (if indicated): yes DVT prophylaxis: SCDs , heparin fixed dose GI prophylaxis: pantoprazole Glucose control: ssi/basal Mobility: bed rest Code Status: limited Family Communication: husband VLynnae Sandhoffat bedside  Disposition: ICU  This patient is critically ill with multiple organ  system failure which requires frequent high complexity decision making, assessment, support, evaluation, and titration of therapies. This was completed through the application of advanced monitoring technologies and extensive interpretation of multiple databases. During this encounter critical care time was devoted to patient care services described in this note for 39 minutes.  Audria Nine, MD 02/22/20 2:31  PM [facility]

## 2020-02-23 ENCOUNTER — Inpatient Hospital Stay (HOSPITAL_COMMUNITY): Payer: PRIVATE HEALTH INSURANCE

## 2020-02-23 DIAGNOSIS — L8990 Pressure ulcer of unspecified site, unspecified stage: Secondary | ICD-10-CM | POA: Insufficient documentation

## 2020-02-23 LAB — RENAL FUNCTION PANEL
Albumin: 3.3 g/dL — ABNORMAL LOW (ref 3.5–5.0)
Albumin: 3.5 g/dL (ref 3.5–5.0)
Anion gap: 9 (ref 5–15)
Anion gap: 12 (ref 5–15)
BUN: 30 mg/dL — ABNORMAL HIGH (ref 6–20)
BUN: 34 mg/dL — ABNORMAL HIGH (ref 6–20)
CO2: 25 mmol/L (ref 22–32)
CO2: 24 mmol/L (ref 22–32)
Calcium: 9 mg/dL (ref 8.9–10.3)
Calcium: 8.8 mg/dL — ABNORMAL LOW (ref 8.9–10.3)
Chloride: 101 mmol/L (ref 98–111)
Chloride: 97 mmol/L — ABNORMAL LOW (ref 98–111)
Creatinine, Ser: 1.02 mg/dL — ABNORMAL HIGH (ref 0.44–1.00)
Creatinine, Ser: 1.07 mg/dL — ABNORMAL HIGH (ref 0.44–1.00)
GFR calc Af Amer: >60 mL/min (ref >60)
GFR calc Af Amer: >60 mL/min (ref >60)
GFR calc non Af Amer: >60 mL/min (ref >60)
GFR calc non Af Amer: 58 mL/min — ABNORMAL LOW (ref >60)
Glucose, Bld: 219 mg/dL — ABNORMAL HIGH (ref 70–99)
Glucose, Bld: 263 mg/dL — ABNORMAL HIGH (ref 70–99)
Phosphorus: 2.1 mg/dL — ABNORMAL LOW (ref 2.5–4.6)
Phosphorus: 4.6 mg/dL (ref 2.5–4.6)
Potassium: 4.7 mmol/L (ref 3.5–5.1)
Potassium: 4.8 mmol/L (ref 3.5–5.1)
Sodium: 135 mmol/L (ref 135–145)
Sodium: 133 mmol/L — ABNORMAL LOW (ref 135–145)

## 2020-02-23 LAB — POCT I-STAT 7, (LYTES, BLD GAS, ICA,H+H)
Acid-Base Excess: 1 mmol/L (ref 0.0–2.0)
Acid-Base Excess: 1 mmol/L (ref 0.0–2.0)
Acid-Base Excess: 0 mmol/L (ref 0.0–2.0)
Acid-Base Excess: 0 mmol/L (ref 0.0–2.0)
Acid-Base Excess: 1 mmol/L (ref 0.0–2.0)
Acid-base deficit: 1 mmol/L (ref 0.0–2.0)
Acid-base deficit: 1 mmol/L (ref 0.0–2.0)
Acid-base deficit: 1 mmol/L (ref 0.0–2.0)
Bicarbonate: 27.4 mmol/L (ref 20.0–28.0)
Bicarbonate: 27.6 mmol/L (ref 20.0–28.0)
Bicarbonate: 26.5 mmol/L (ref 20.0–28.0)
Bicarbonate: 26.9 mmol/L (ref 20.0–28.0)
Bicarbonate: 25.8 mmol/L (ref 20.0–28.0)
Bicarbonate: 26 mmol/L (ref 20.0–28.0)
Bicarbonate: 25 mmol/L (ref 20.0–28.0)
Bicarbonate: 26 mmol/L (ref 20.0–28.0)
Calcium, Ion: 1.2 mmol/L (ref 1.15–1.40)
Calcium, Ion: 1.21 mmol/L (ref 1.15–1.40)
Calcium, Ion: 1.17 mmol/L (ref 1.15–1.40)
Calcium, Ion: 1.2 mmol/L (ref 1.15–1.40)
Calcium, Ion: 1.17 mmol/L (ref 1.15–1.40)
Calcium, Ion: 1.16 mmol/L (ref 1.15–1.40)
Calcium, Ion: 1.13 mmol/L — ABNORMAL LOW (ref 1.15–1.40)
Calcium, Ion: 1.15 mmol/L (ref 1.15–1.40)
HCT: 32 % — ABNORMAL LOW (ref 36.0–46.0)
HCT: 31 % — ABNORMAL LOW (ref 36.0–46.0)
HCT: 31 % — ABNORMAL LOW (ref 36.0–46.0)
HCT: 31 % — ABNORMAL LOW (ref 36.0–46.0)
HCT: 31 % — ABNORMAL LOW (ref 36.0–46.0)
HCT: 31 % — ABNORMAL LOW (ref 36.0–46.0)
HCT: 31 % — ABNORMAL LOW (ref 36.0–46.0)
HCT: 32 % — ABNORMAL LOW (ref 36.0–46.0)
Hemoglobin: 10.9 g/dL — ABNORMAL LOW (ref 12.0–15.0)
Hemoglobin: 10.5 g/dL — ABNORMAL LOW (ref 12.0–15.0)
Hemoglobin: 10.5 g/dL — ABNORMAL LOW (ref 12.0–15.0)
Hemoglobin: 10.5 g/dL — ABNORMAL LOW (ref 12.0–15.0)
Hemoglobin: 10.5 g/dL — ABNORMAL LOW (ref 12.0–15.0)
Hemoglobin: 10.5 g/dL — ABNORMAL LOW (ref 12.0–15.0)
Hemoglobin: 10.5 g/dL — ABNORMAL LOW (ref 12.0–15.0)
Hemoglobin: 10.9 g/dL — ABNORMAL LOW (ref 12.0–15.0)
O2 Saturation: 86 %
O2 Saturation: 85 %
O2 Saturation: 85 %
O2 Saturation: 86 %
O2 Saturation: 90 %
O2 Saturation: 100 %
O2 Saturation: 79 %
O2 Saturation: 85 %
Patient temperature: 98.2
Patient temperature: 98
Patient temperature: 36.6
Patient temperature: 98.3
Patient temperature: 36.8
Patient temperature: 36.8
Patient temperature: 36.8
Patient temperature: 98.6
Potassium: 4.8 mmol/L (ref 3.5–5.1)
Potassium: 4.7 mmol/L (ref 3.5–5.1)
Potassium: 4.7 mmol/L (ref 3.5–5.1)
Potassium: 4.8 mmol/L (ref 3.5–5.1)
Potassium: 4.6 mmol/L (ref 3.5–5.1)
Potassium: 4.8 mmol/L (ref 3.5–5.1)
Potassium: 5.2 mmol/L — ABNORMAL HIGH (ref 3.5–5.1)
Potassium: 5 mmol/L (ref 3.5–5.1)
Sodium: 136 mmol/L (ref 135–145)
Sodium: 136 mmol/L (ref 135–145)
Sodium: 136 mmol/L (ref 135–145)
Sodium: 136 mmol/L (ref 135–145)
Sodium: 136 mmol/L (ref 135–145)
Sodium: 136 mmol/L (ref 135–145)
Sodium: 135 mmol/L (ref 135–145)
Sodium: 136 mmol/L (ref 135–145)
TCO2: 29 mmol/L (ref 22–32)
TCO2: 29 mmol/L (ref 22–32)
TCO2: 28 mmol/L (ref 22–32)
TCO2: 29 mmol/L (ref 22–32)
TCO2: 27 mmol/L (ref 22–32)
TCO2: 27 mmol/L (ref 22–32)
TCO2: 26 mmol/L (ref 22–32)
TCO2: 27 mmol/L (ref 22–32)
pCO2 arterial: 48.5 mmHg — ABNORMAL HIGH (ref 32.0–48.0)
pCO2 arterial: 50.7 mmHg — ABNORMAL HIGH (ref 32.0–48.0)
pCO2 arterial: 53.5 mmHg — ABNORMAL HIGH (ref 32.0–48.0)
pCO2 arterial: 55 mmHg — ABNORMAL HIGH (ref 32.0–48.0)
pCO2 arterial: 50.1 mmHg — ABNORMAL HIGH (ref 32.0–48.0)
pCO2 arterial: 47.2 mmHg (ref 32.0–48.0)
pCO2 arterial: 43.5 mmHg (ref 32.0–48.0)
pCO2 arterial: 43.8 mmHg (ref 32.0–48.0)
pH, Arterial: 7.358 (ref 7.350–7.450)
pH, Arterial: 7.342 — ABNORMAL LOW (ref 7.350–7.450)
pH, Arterial: 7.296 — ABNORMAL LOW (ref 7.350–7.450)
pH, Arterial: 7.301 — ABNORMAL LOW (ref 7.350–7.450)
pH, Arterial: 7.318 — ABNORMAL LOW (ref 7.350–7.450)
pH, Arterial: 7.348 — ABNORMAL LOW (ref 7.350–7.450)
pH, Arterial: 7.366 (ref 7.350–7.450)
pH, Arterial: 7.383 (ref 7.350–7.450)
pO2, Arterial: 53 mmHg — ABNORMAL LOW (ref 83.0–108.0)
pO2, Arterial: 53 mmHg — ABNORMAL LOW (ref 83.0–108.0)
pO2, Arterial: 56 mmHg — ABNORMAL LOW (ref 83.0–108.0)
pO2, Arterial: 58 mmHg — ABNORMAL LOW (ref 83.0–108.0)
pO2, Arterial: 63 mmHg — ABNORMAL LOW (ref 83.0–108.0)
pO2, Arterial: 191 mmHg — ABNORMAL HIGH (ref 83.0–108.0)
pO2, Arterial: 45 mmHg — ABNORMAL LOW (ref 83.0–108.0)
pO2, Arterial: 51 mmHg — ABNORMAL LOW (ref 83.0–108.0)

## 2020-02-23 LAB — CBC
HCT: 33.3 % — ABNORMAL LOW (ref 36.0–46.0)
HCT: 31.5 % — ABNORMAL LOW (ref 36.0–46.0)
Hemoglobin: 10.3 g/dL — ABNORMAL LOW (ref 12.0–15.0)
Hemoglobin: 9.7 g/dL — ABNORMAL LOW (ref 12.0–15.0)
MCH: 32.3 pg (ref 26.0–34.0)
MCH: 31.9 pg (ref 26.0–34.0)
MCHC: 30.9 g/dL (ref 30.0–36.0)
MCHC: 30.8 g/dL (ref 30.0–36.0)
MCV: 104.4 fL — ABNORMAL HIGH (ref 80.0–100.0)
MCV: 103.6 fL — ABNORMAL HIGH (ref 80.0–100.0)
Platelets: 109 10*3/uL — ABNORMAL LOW (ref 150–400)
Platelets: 101 10*3/uL — ABNORMAL LOW (ref 150–400)
RBC: 3.19 MIL/uL — ABNORMAL LOW (ref 3.87–5.11)
RBC: 3.04 MIL/uL — ABNORMAL LOW (ref 3.87–5.11)
RDW: 24.3 % — ABNORMAL HIGH (ref 11.5–15.5)
RDW: 23.8 % — ABNORMAL HIGH (ref 11.5–15.5)
WBC: 9.9 10*3/uL (ref 4.0–10.5)
WBC: 10.6 10*3/uL — ABNORMAL HIGH (ref 4.0–10.5)
nRBC: 2.1 % — ABNORMAL HIGH (ref 0.0–0.2)
nRBC: 1.3 % — ABNORMAL HIGH (ref 0.0–0.2)

## 2020-02-23 LAB — APTT
aPTT: 51 seconds — ABNORMAL HIGH (ref 24–36)
aPTT: 71 seconds — ABNORMAL HIGH (ref 24–36)

## 2020-02-23 LAB — MAGNESIUM: Magnesium: 3 mg/dL — ABNORMAL HIGH (ref 1.7–2.4)

## 2020-02-23 LAB — LACTATE DEHYDROGENASE: LDH: 356 U/L — ABNORMAL HIGH (ref 98–192)

## 2020-02-23 LAB — HEMOGLOBIN AND HEMATOCRIT, BLOOD
HCT: 32 % — ABNORMAL LOW (ref 36.0–46.0)
Hemoglobin: 9.9 g/dL — ABNORMAL LOW (ref 12.0–15.0)

## 2020-02-23 LAB — HEPARIN LEVEL (UNFRACTIONATED)
Heparin Unfractionated: 0.12 IU/mL — ABNORMAL LOW (ref 0.30–0.70)
Heparin Unfractionated: 0.17 IU/mL — ABNORMAL LOW (ref 0.30–0.70)

## 2020-02-23 LAB — FIBRINOGEN: Fibrinogen: 541 mg/dL — ABNORMAL HIGH (ref 210–475)

## 2020-02-23 LAB — GLUCOSE, CAPILLARY
Glucose-Capillary: 218 mg/dL — ABNORMAL HIGH (ref 70–99)
Glucose-Capillary: 218 mg/dL — ABNORMAL HIGH (ref 70–99)
Glucose-Capillary: 263 mg/dL — ABNORMAL HIGH (ref 70–99)
Glucose-Capillary: 250 mg/dL — ABNORMAL HIGH (ref 70–99)
Glucose-Capillary: 225 mg/dL — ABNORMAL HIGH (ref 70–99)

## 2020-02-23 LAB — FOLATE: Folate: 11.3 ng/mL (ref >5.9)

## 2020-02-23 LAB — LACTIC ACID, PLASMA
Lactic Acid, Venous: 2 mmol/L (ref 0.5–1.9)
Lactic Acid, Venous: 1.7 mmol/L (ref 0.5–1.9)

## 2020-02-23 LAB — PREPARE RBC (CROSSMATCH)

## 2020-02-23 MED ORDER — MIDAZOLAM HCL 2 MG/2ML IJ SOLN
INTRAMUSCULAR | Status: AC
  Administered 2020-02-23: 2 mg
  Filled 2020-02-23: qty 2

## 2020-02-23 MED ORDER — INSULIN ASPART 100 UNIT/ML ~~LOC~~ SOLN
7.0000 [IU] | SUBCUTANEOUS | Status: DC
  Administered 2020-02-23 – 2020-02-26 (×19): 7 [IU] via SUBCUTANEOUS

## 2020-02-23 MED ORDER — EPINEPHRINE 1 MG/10ML IJ SOSY
PREFILLED_SYRINGE | INTRAMUSCULAR | Status: AC
  Filled 2020-02-23: qty 10

## 2020-02-23 MED ORDER — HEPARIN SODIUM (PORCINE) 5000 UNIT/ML IJ SOLN
INTRAMUSCULAR | Status: AC
  Administered 2020-02-23: 4000 [IU]
  Filled 2020-02-23: qty 2

## 2020-02-23 MED ORDER — SODIUM PHOSPHATES 45 MMOLE/15ML IV SOLN
30.0000 mmol | Freq: Once | INTRAVENOUS | Status: AC
  Administered 2020-02-23: 30 mmol via INTRAVENOUS
  Filled 2020-02-23: qty 10

## 2020-02-23 MED ORDER — INSULIN DETEMIR 100 UNIT/ML ~~LOC~~ SOLN
32.0000 [IU] | Freq: Two times a day (BID) | SUBCUTANEOUS | Status: DC
  Administered 2020-02-23 – 2020-02-26 (×7): 32 [IU] via SUBCUTANEOUS
  Filled 2020-02-23 (×8): qty 0.32

## 2020-02-23 MED ORDER — SODIUM CHLORIDE 0.9% IV SOLUTION: Freq: Once | INTRAVENOUS | Status: AC

## 2020-02-23 MED ORDER — MIDAZOLAM HCL 2 MG/2ML IJ SOLN
2.0000 mg | Freq: Once | INTRAMUSCULAR | Status: AC | PRN
  Administered 2020-02-23: 1 mg via INTRAVENOUS
  Filled 2020-02-23: qty 2

## 2020-02-23 NOTE — Progress Notes (Signed)
Pt more lethargic today. Performed ROM of UEs only. Pt due for ECMO pump change later today.    02/23/20 1500  OT Visit Information  Last OT Received On 02/23/20  Assistance Needed +2 (+2 mobility, RNs for lines)  History of Present Illness Pt is 54 yo admitted 8/2 with hypoxemia due to Covid 19 PNA. 8/3 intubated. 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 CRRT 8/22-9/6, restarted 9/9. Reintubated 8/24 with ECMO circuit changed, 8/30 trach. 9/10 PEG tube. PMHx: DM, diverticulitis, Asthma  Precautions  Precautions Other (comment)  Precaution Comments ECMO with RIJ access anchors at right shoulder and chest, right necrotic thumb and index finger, bruised/necrotic left hand, trach, vent, CRRT, PEG  Required Braces or Orthoses Other Brace  Other Brace B PRAFO  Pain Assessment  Pain Assessment Faces  Faces Pain Scale 0  Cognition  Arousal/Alertness Lethargic  Behavior During Therapy WFL for tasks assessed/performed  Overall Cognitive Status Difficult to assess  Following Commands Follows one step commands with increased time;Follows one step commands inconsistently  General Comments minimal attempt to mouth words  Difficult to assess due to Tracheostomy  ADL  General ADL Comments total assist  Exercises  Exercises General Upper Extremity  General Exercises - Upper Extremity  Shoulder Flexion PROM;Both;5 reps;Supine  Shoulder Horizontal ABduction PROM;Both;5 reps;Supine  Shoulder Horizontal ADduction PROM;Supine;Both;5 reps  Elbow Flexion PROM;Both;5 reps;Supine  Elbow Extension Both;Supine;PROM;5 reps  Wrist Flexion 5 reps;Supine;PROM;Both  Wrist Extension 5 reps;Supine;PROM;Both  Digit Composite Flexion PROM;5 reps;Seated;Both  Composite Extension PROM;Both;5 reps;Supine  OT - End of Session  Activity Tolerance Patient tolerated treatment well  Patient left in bed;with call bell/phone within reach;with family/visitor present  OT  Assessment/Plan  OT Plan Discharge plan remains appropriate  OT Visit Diagnosis Muscle weakness (generalized) (M62.81);Other symptoms and signs involving cognitive function  OT Frequency (ACUTE ONLY) Min 2X/week  Follow Up Recommendations LTACH;Supervision/Assistance - 24 hour  OT Equipment Other (comment) (defer to next venue)  AM-PAC OT "6 Clicks" Daily Activity Outcome Measure (Version 2)  Help from another person eating meals? 1  Help from another person taking care of personal grooming? 1  Help from another person toileting, which includes using toliet, bedpan, or urinal? 1  Help from another person bathing (including washing, rinsing, drying)? 1  Help from another person to put on and taking off regular upper body clothing? 1  Help from another person to put on and taking off regular lower body clothing? 1  6 Click Score 6  OT Goal Progression  Progress towards OT goals Not progressing toward goals - comment (more lethargic today)  Acute Rehab OT Goals  Patient Stated Goal per husband, to see her dog [name]  OT Goal Formulation Patient unable to participate in goal setting  Time For Goal Achievement 03/08/20  Potential to Achieve Goals Fair  OT Time Calculation  OT Start Time (ACUTE ONLY) 1443  OT Stop Time (ACUTE ONLY) 1500  OT Time Calculation (min) 17 min  OT General Charges  $OT Visit 1 Visit  OT Treatments  $Therapeutic Exercise 8-22 mins  Nestor Lewandowsky, OTR/L Acute Rehabilitation Services Pager: [phone] Office: [phone]

## 2020-02-23 NOTE — Progress Notes (Signed)
  Speech Language Pathology Treatment: Nada Boozer Speaking valve  Patient Details Name: Alisha Stephenson MRN: [id number] DOB: June 24, 1965 Today's Date: 02/23/2020 Time: 1340-1410 SLP Time Calculation (min) (ACUTE ONLY): 30 min  Assessment / Plan / Recommendation Clinical Impression  Pt was seen for inline PMV trials in collaboration with RT.  Pt sleepy and not as interactive today from effects of seroquel given last night.  Husband at bedside, encouraging pt.  PEEP dropped from 10 to 5 and cuff slowly deflated. PMV placed.  RT changed mode from [facility] to CPAP. With verbal cues, pt practiced slow inhalation/exhalations, blowing with pursed lips and using movement of Kleenex as feedback for successful airflow.  Demonstrated improved ability to achieve phonation today, although low in volume and at brief intervals, followed by whispered speech.  She recited the days-of-week and counted, needing cues to slow pacing and control RR.  Pt tachypneic during session and quite sleepy; session ceased, cuff reinflated and RT restored prior vent settings.  Informed pt/spouse that SLP will return early next week.  They verbalized understanding.    HPI HPI: 54 y/o female admitted on 8/2 with severe acute respiratory failure with hypoxemia due to COVID 19 pneumonia.  She developed symptoms 1 week prior to admission. Intubated 8/3, ECMO started 8/9, ICH on 8/13 Ct reads Multifocal parenchymal hemorrhages, with the largest within the right parietal lobe. Trach placed 8/30, PEG on 9/10. MD writes on 9/13 "ultra lung protective ventilation.  No trach collar trials as this may delay ECMO weaning trials.  Okay to try Passy-Muir trials on the vent."      SLP Plan  Continue with current plan of care       Recommendations         Patient may use Passy-Muir Speech Valve: with SLP only         Follow up Recommendations:  (tba) SLP Visit Diagnosis: Aphonia (R49.1) Plan: Continue with current plan of care        GO                Juan Quam Laurice 02/23/2020, 5:10 PM  Bexton Haak L. Tivis Ringer, [place] Office number [phone] Pager [phone]

## 2020-02-23 NOTE — Progress Notes (Signed)
[facility] Progress Note    Assessment/ Plan:    51F severe MSOF from COVID-19 pneumonia requiring VV ECMO.  1.  Anuric AKI due to shock, sepsis causing acute tubular necrosis:CRRT on 8/22- 02/12/20. IHD 02/13/20.  Planned for 9/9 but back to CRRT d/t fluid overload.  L IJ nontunneled catheter changed 9/9 from L [place].   - Continue CRRT.  On All 4K - Watching K.  Reduced dialysate to 1.5 L/hr from 2 on 9/13. - UF goal net neg 50 mL/ hr as tolerated  - Note renal added flat rate heparin 500 u/ hr to circuit 02/18/20 d/t clotting x 2.    2.Shock due to sepsis, Covid infection, blood cultures positive for Enterococcus bacteremia: s/p Zosyn (ended 9/7), cultures repeated 9/7, NGTD.  TTE 9/7 no AI, done for concern of wide pulse pressure  3.Acute hypoxic and hypercapnic respiratory failure, ARDS due to COVID-19 pneumonia: on VV ECMO for refractory hypoxemia.  3.Hypernatremia, hypervolemia: relative free water deficit. Improved with CRRT.  4.Acute toxic metabolic encephalopathy:Multifactorial, improving  5.Anemia, thrombocytopenia: With hemolysis likely and acute illness contributing. Transfusions per primary team.  6.Intracranial hemorrhage: With left-sided weakness, ? Septic emboli  7.  Diabetes per primary  8.  Hypophosphatemia repleting as needed; repletion again on 9/17 am   9.  Critical illness myopathy: PT as able per primary team    Subjective:    Seen and examined on CRRT.  She has been net neg 50 to 100 an hour then backed off as pt had chugging on ECMO.  No urine output charted.  Had 3.9L UF with CRRT over 9/16 as charted thus far.   Review of systems   Denies Shortness of breath /no air hunger Denies chest pain  Denies n/v   Objective:   BP (!) 93/43   Pulse (!) 107   Temp 97.8 F (36.6 C) (Oral)   Resp (!) 31   Ht _0  (1.626 m)   Wt 113.2 kg   SpO2 92%   BMI 42.84 kg/m   Intake/Output Summary (Last 24 hours) at 02/23/2020 0612 Last  data filed at 02/23/2020 0500 Gross per 24 hour  Intake 2921.33 ml  Output 4199 ml  Net -1277.67 ml   Weight change:   Physical Exam:   Gen: critically ill, lying in bed in NAD  NECK: R IJ ECMO access CVS: RRR Resp: diminished on auscultation  Abd: obese habitus nontender Ext: trace edema bilaterally ACCESS: L IJ nontunneled HD catheter MSK: necrotic thumb and 1st finger R hand Psych no anxiety or agitation Neuro - alert and oriented - provides hx and follows commands  Imaging: DG CHEST PORT 1 VIEW  Result Date: 02/22/2020 CLINICAL DATA:  Hypoxia EXAM: PORTABLE CHEST 1 VIEW COMPARISON:  February 21, 2020 FINDINGS: Tracheostomy catheter tip is 6.7 cm above the carina. ECMO catheter tip is in the inferior vena cava. Left jugular catheter tip is in the superior vena cava. No pneumothorax. There is widespread airspace opacity bilaterally. There is stable cardiomegaly. There is pulmonary venous hypertension. No adenopathy appreciable. Postoperative change noted in the lower cervical spine. IMPRESSION: Tube and catheter positions as described without pneumothorax. Persistent widespread airspace opacity throughout the lungs bilaterally. Suspect multifocal pneumonia with questionable degree of superimposed edema and/or ARDS. There is stable cardiomegaly with pulmonary vascular congestion. Electronically Signed   By: Lowella Grip III M.D.   On: 02/22/2020 08:27   DG CHEST PORT 1 VIEW  Result Date: 02/21/2020 CLINICAL DATA:  Respiratory  failure. EXAM: PORTABLE CHEST 1 VIEW COMPARISON:  February 20, 2020. FINDINGS: Stable cardiomegaly. Tracheostomy is unchanged in position. ECMO device is unchanged. Left internal jugular catheter is unchanged. Stable bilateral lung opacities are noted consistent with multifocal pneumonia. Small pleural effusions may be present. No pneumothorax is noted. Bony thorax is unremarkable. IMPRESSION: Stable support apparatus. Stable bilateral lung opacities consistent  with multifocal pneumonia. Electronically Signed   By: Marijo Conception M.D.   On: 02/21/2020 08:25    Labs: BMET Recent Labs  Lab 02/20/20 0233 02/20/20 0552 02/20/20 1545 02/20/20 1545 02/20/20 1618 02/20/20 1628 02/21/20 0257 02/21/20 0535 02/21/20 1558 02/21/20 1712 02/22/20 0422 02/22/20 0436 02/22/20 1609 02/22/20 1624 02/22/20 1752 02/22/20 1958 02/22/20 2332 02/23/20 0307 02/23/20 0320  NA 135   < > 136   < > 135   < > 134*   < > 136   < > 136   < > 136 137 136 136 135 135 136  K 3.6   < > 3.9   < > 4.0   < > 4.1   < > 4.1   < > 4.1   < > 4.4 4.4 4.5 4.3 4.6 4.7 4.8  CL 100   < > 101  --  100  --  101  --  102  --  102  --  101  --   --   --   --  101  --   CO2 25   < > 24  --  24  --  24  --  25  --  25  --  25  --   --   --   --  25  --   GLUCOSE 172*   < > 188*  --  197*  --  184*  --  166*  --  198*  --  214*  --   --   --   --  219*  --   BUN 20   < > 21*  --  22*  --  27*  --  27*  --  26*  --  27*  --   --   --   --  30*  --   CREATININE 0.86   < > 0.98  --  0.94  --  1.03*  --  0.99  --  0.93  --  0.94  --   --   --   --  1.02*  --   CALCIUM 8.3*   < > 8.4*  --  8.5*  --  8.3*  --  8.5*  --  8.4*  --  8.7*  --   --   --   --  9.0  --   PHOS 1.9*  --  3.8  --   --   --  3.1  --  2.2*  --  2.4*  --  3.6  --   --   --   --  2.1*  --    < > = values in this interval not displayed.   CBC Recent Labs  Lab 02/21/20 1558 02/21/20 1712 02/22/20 0422 02/22/20 0436 02/22/20 1609 02/22/20 1624 02/22/20 1958 02/22/20 2332 02/23/20 0307 02/23/20 0320  WBC 9.3  --  6.9  --  8.8  --   --   --  9.9  --   HGB 8.7*   < > 7.8*   < > 9.7*   < > 10.5* 10.2* 10.3* 10.9*  HCT  28.5*   < > 26.3*   < > 32.1*   < > 31.0* 30.0* 33.3* 32.0*  MCV 108.8*  --  109.6*  --  103.9*  --   --   --  104.4*  --   PLT 108*  --  102*  --  101*  --   --   --  109*  --    < > = values in this interval not displayed.    Medications:    . amiodarone  200 mg Per Tube Daily  . vitamin C   500 mg Per Tube Daily  . chlorhexidine gluconate (MEDLINE KIT)  15 mL Mouth Rinse BID  . Chlorhexidine Gluconate Cloth  6 each Topical Q0600  . clonazePAM  1 mg Per Tube Q6H  . collagenase   Topical Daily  . docusate  100 mg Per Tube BID  . feeding supplement (PROSource TF)  45 mL Per Tube QID  . Gerhardt's butt cream   Topical BID  . influenza vac split quadrivalent PF  0.5 mL Intramuscular Tomorrow-1000  . insulin aspart  0-15 Units Subcutaneous Q4H  . insulin aspart  6 Units Subcutaneous Q4H  . insulin detemir  30 Units Subcutaneous BID  . linagliptin  5 mg Per Tube Daily  . lipase/protease/amylase)  20,880 Units Per Tube Once   And  . sodium bicarbonate  650 mg Per Tube Once  . mouth rinse  15 mL Mouth Rinse 10 times per day  . melatonin  9 mg Per Tube QHS  . metoCLOPramide (REGLAN) injection  5 mg Intravenous Q8H  . midodrine  10 mg Per Tube TID  . multivitamin  1 tablet Per Tube QHS  . oxyCODONE  10 mg Per Tube Q6H  . pantoprazole (PROTONIX) IV  40 mg Intravenous BID  . pneumococcal 23 valent vaccine  0.5 mL Intramuscular Tomorrow-1000  . polyethylene glycol  17 g Per Tube Daily  . QUEtiapine  100 mg Per Tube QHS  . sennosides  5 mL Per Tube BID  . zinc sulfate  220 mg Per Tube Daily     Claudia Desanctis MD 02/23/2020, 6:12 AM

## 2020-02-23 NOTE — Progress Notes (Signed)
Inpatient Diabetes Program Recommendations  AACE/ADA: New Consensus Statement on Inpatient Glycemic Control   Target Ranges:  Prepandial:   less than 140 mg/dL      Peak postprandial:   less than 180 mg/dL (1-2 hours)      Critically ill patients:  140 - 180 mg/dL   Results for [facility], Alisha [facility] (MRN [id number]) as of 02/23/2020 10:14  Ref. Range 02/22/2020 08:13 02/22/2020 11:48 02/22/2020 16:21 02/22/2020 19:56 02/22/2020 23:30 02/23/2020 03:18 02/23/2020 07:42  Glucose-Capillary Latest Ref Range: 70 - 99 mg/dL 178 (H) 215 (H) 211 (H) 205 (H) 202 (H) 218 (H) 218 (H)   Review of Glycemic Control  Current orders for Inpatient glycemic control: Levemir 32 units BID, Novolog 0-15 units Q4H, Novolog 7 units Q4H for tube feeding coverage, Tradjenta 5 mg daily  Inpatient Diabetes Program Recommendations:    Insulin: Noted Levemir increased from 30 to 32 units BID and tube feeding coverage increased from Novolog 6 to 7 units Q4H. Please consider increasing Levemir to 35 units BID and tube feeding coverage to Novolog 10 units Q4H.  Thanks, Barnie Alderman, RN, MSN, CDE Diabetes Coordinator Inpatient Diabetes Program [phone] (Team Pager from 8am to 5pm)

## 2020-02-23 NOTE — Progress Notes (Signed)
   Patient with persistent hypoxia throughout the day. Also unable to wean Sweep. Post-oxygenator gas checked and paO2 only 191.   Decision made to perform circuit change.   Patient given 2mg  versed.   Emergency equipment/meds in room. Ventilator turned to FiO2 100%  Given 4,000u SQ heparin bolus.   New lines brought sterilely to bedside with connectors. Current lines sterilely prepped. Procedure/duties reviewed by entire team. Timeout performed.   Lines clamped and cut by ECMO team. Patient taken off of ECMO. Wet-to-wet connections made and patient back on ECMO in 75 seconds. Hemodynamically stable. Sats dropped to a nadir of 81%.   I was present for entire procedure. Dr. Valeta Harms at bedside as well.   CCT 45 mins.   Glori Bickers, MD  10:34 PM

## 2020-02-23 NOTE — Progress Notes (Signed)
ANTICOAGULATION CONSULT NOTE Pharmacy Consult for Heparin Indication: ECMO  Allergies  Allergen Reactions   Sulfa Antibiotics Rash and Other (See Comments)    Patient Measurements: Height: 5\' 4"  (162.6 cm) Weight: 113.2 kg (249 lb 9 oz) IBW/kg (Calculated) : 54.7 Heparin Dosing Weight: 87 kg  Vital Signs: Temp: 98.6 F (37 C) (09/17 2000) Temp Source: Oral (09/17 2000) BP: 115/43 (09/17 2018) Pulse Rate: 105 (09/17 2030)  Labs: Recent Labs    02/22/20 1609 02/22/20 1624 02/23/20 0307 02/23/20 0320 02/23/20 0625 02/23/20 0745 02/23/20 1609 02/23/20 1609 02/23/20 1826 02/23/20 1826 02/23/20 1946 02/23/20 2017  HGB 9.7*   < > 10.3*   < >  --    < > 9.7*   < > 10.5*   < > 9.9* 10.9*  HCT 32.1*   < > 33.3*   < >  --    < > 31.5*   < > 31.0*  --  32.0* 32.0*  PLT 101*  --  109*  --   --   --  101*  --   --   --   --   --   APTT 77*  --  51*  --   --   --  71*  --   --   --   --   --   HEPARINUNFRC 0.16*  --   --   --  0.12*  --  0.17*  --   --   --   --   --   CREATININE 0.94  --  1.02*  --   --   --  1.07*  --   --   --   --   --    < > = values in this interval not displayed.    Estimated Creatinine Clearance: 73.2 mL/min (A) (by C-G formula based on SCr of 1.07 mg/dL (H)).  Assessment: 54 yo female COVID+ on VV ECMO. Patient started on bivalirudin but was found to have multiple ICH on 8/13 head CT. Bivalirudin was stopped and pt was off anticoagulation at that time. Her ECMO circuit was changed 8/24. Pharmacy asked to start low-fixed rate heparin. Heparin infusion was held last week due to bleeding from trach site. Heparin was started at low fixed rate for pump patency in setting of ICH. Concern with increased  clotting off crrt as well as circuit clots - s/p circuit exchange 9/17 so will start to titrate heparin to a low goal of 0.2-0.3 HL 0.17 this evening but then heparin bolus 4000 utsx1 with circuit exchange  hgb stable, LDH and fibrinogen stable, PLTC improved  100s No changes tonight  - may need boost in am    Goal of Therapy:  HL 0.2-0.3 Monitor platelets by anticoagulation protocol: Yes   Plan:  Continue  heparin  900 units/h - may need to increase in am q12h heparin level, CBC  Bonnita Nasuti Pharm.D. CPP, BCPS Clinical Pharmacist [phone] 02/23/2020 8:45 PM    Please check AMION for all [place] numbers 02/23/2020

## 2020-02-23 NOTE — Progress Notes (Signed)
Patient ID: Alisha Stephenson, female   DOB: 1966/02/12, 55 y.o.   MRN: [id number]    Advanced Heart Failure Rounding Note   Subjective:    - 8/2 COVID + test - 8/9 Cannulated for VV ECMO - 8/13 with several areas of intracranial hemorrhage. Bival stopped.  - 8/14 CT no change in [place]. Increased edema - 8/16 Extubated - 8/16 Head CT stable bleed - 8/22 CVVHD started - 8/24 reintubated - 8/24 circuit changed - 8/30 trach - 9/7  Switched to iHD - 02/15/20 Switched back to CVVHD - 9/10 GJ tube placed  Remains awake on vent via trach. Following commands and able to move more, working with PT/OT daily. Speech following as well for PMV.   Remains on CVVHD. Pulled 1L last night. Had some chugging this am. Gave albumin. Now keeping even. No weight recorded. Remains anuric.  Remains off pressors. On po midodrine. Wide pulse pressure. No AI on echo.   CXR looks better this am particularly on left. Personally reviewed  ECMO  Flow 4.7 L RPM 4100 Sweep10  Labs: 7.34/51/53/85% 40% FiO2 TV 150 cc Hgb10.3 PLT 109k LDH 315 -> 374 -> 383 -> 331 -> 356 Lactic acid1.6 -> 2.0   Objective:   Weight Range:  Vital Signs:   Temp:  [97.8 F (36.6 C)-98.3 F (36.8 C)] 97.8 F (36.6 C) (09/16 2300) Pulse Rate:  [98-110] 104 (09/17 0800) Resp:  [16-31] 30 (09/17 0800) BP: (93-114)/(43-48) 93/43 (09/17 0331) SpO2:  [86 %-96 %] 90 % (09/17 0800) Arterial Line BP: (92-138)/(43-56) 113/53 (09/17 0800) FiO2 (%):  [40 %] 40 % (09/17 0800) Last BM Date: 02/23/20  Weight change: Filed Weights   02/20/20 0500 02/21/20 0500 02/22/20 0410  Weight: 110.8 kg 115.9 kg 113.2 kg    Intake/Output:   Intake/Output Summary (Last 24 hours) at 02/23/2020 0838 Last data filed at 02/23/2020 0800 Gross per 24 hour  Intake 2999.8 ml  Output 4156 ml  Net -1156.2 ml     Physical Exam: General:  Lying in bed  No resp difficulty HEENT: normal Neck: supple. RIJ ECMO LIJ trialysis Carotids 2+ bilat; no  bruits. No lymphadenopathy or thryomegaly appreciated. Cor: PMI nondisplaced. Regular rate & rhythm. No rubs, gallops or murmurs. Lungs: coarse Abdomen: obese soft, nontender, nondistended. No hepatosplenomegaly. No bruits or masses. Good bowel sounds. Extremities: no cyanosis, clubbing, rash, tr edema Neuro: alert & orientedx3, remains weak    Telemetry: Sinus 90-105 Personally reviewed   Labs: Basic Metabolic Panel: Recent Labs  Lab 02/19/20 0335 02/19/20 0350 02/20/20 0232 02/20/20 0233 02/21/20 0257 02/21/20 0535 02/21/20 1558 02/21/20 1712 02/22/20 0422 02/22/20 0436 02/22/20 1609 02/22/20 1624 02/22/20 1958 02/22/20 2332 02/23/20 0307 02/23/20 0320 02/23/20 0745  NA 137   < >  --    < > 134*   < > 136   < > 136   < > 136   < > 136 135 135 136 136  K 3.7   < >  --    < > 4.1   < > 4.1   < > 4.1   < > 4.4   < > 4.3 4.6 4.7 4.8 4.7  CL 103   < >  --    < > 101  --  102  --  102  --  101  --   --   --  101  --   --   CO2 26   < >  --    < >  24  --  25  --  25  --  25  --   --   --  25  --   --   GLUCOSE 176*   < >  --    < > 184*  --  166*  --  198*  --  214*  --   --   --  219*  --   --   BUN 15   < >  --    < > 27*  --  27*  --  26*  --  27*  --   --   --  30*  --   --   CREATININE 0.93   < >  --    < > 1.03*  --  0.99  --  0.93  --  0.94  --   --   --  1.02*  --   --   CALCIUM 8.2*   < >  --    < > 8.3*   < > 8.5*   < > 8.4*  --  8.7*  --   --   --  9.0  --   --   MG 2.5*  --  2.5*  --  2.6*  --   --   --  2.7*  --   --   --   --   --  3.0*  --   --   PHOS 1.3*   < >  --    < > 3.1  --  2.2*  --  2.4*  --  3.6  --   --   --  2.1*  --   --    < > = values in this interval not displayed.    Liver Function Tests: Recent Labs  Lab 02/21/20 0257 02/21/20 1558 02/22/20 0422 02/22/20 1609 02/23/20 0307  ALBUMIN 2.7* 2.9* 3.0* 3.3* 3.3*   No results for input(s): LIPASE, AMYLASE in the last 168 hours. No results for input(s): AMMONIA in the last 168  hours.  CBC: Recent Labs  Lab 02/21/20 0257 02/21/20 0535 02/21/20 1558 02/21/20 1712 02/22/20 0422 02/22/20 0436 02/22/20 1609 02/22/20 1624 02/22/20 1958 02/22/20 2332 02/23/20 0307 02/23/20 0320 02/23/20 0745  WBC 8.9  --  9.3  --  6.9  --  8.8  --   --   --  9.9  --   --   HGB 8.5*   < > 8.7*   < > 7.8*   < > 9.7*   < > 10.5* 10.2* 10.3* 10.9* 10.5*  HCT 28.3*   < > 28.5*   < > 26.3*   < > 32.1*   < > 31.0* 30.0* 33.3* 32.0* 31.0*  MCV 107.6*  --  108.8*  --  109.6*  --  103.9*  --   --   --  104.4*  --   --   PLT 115*  --  108*  --  102*  --  101*  --   --   --  109*  --   --    < > = values in this interval not displayed.    Cardiac Enzymes: No results for input(s): CKTOTAL, CKMB, CKMBINDEX, TROPONINI in the last 168 hours.  BNP: BNP (last 3 results) No results for input(s): BNP in the last 8760 hours.  ProBNP (last 3 results) No results for input(s): PROBNP in the last 8760 hours.    Other results:  Imaging: DG CHEST PORT  1 VIEW  Result Date: 02/23/2020 CLINICAL DATA:  Respiratory failure, ECMO EXAM: PORTABLE CHEST 1 VIEW COMPARISON:  02/22/2020 FINDINGS: Support devices are stable. Extensive bilateral airspace disease. No change. Mild cardiomegaly. No visible effusions or pneumothorax. IMPRESSION: Support devices stable. Stable extensive diffuse bilateral airspace disease. Electronically Signed   By: Rolm Baptise M.D.   On: 02/23/2020 06:50   DG CHEST PORT 1 VIEW  Result Date: 02/22/2020 CLINICAL DATA:  Hypoxia EXAM: PORTABLE CHEST 1 VIEW COMPARISON:  February 21, 2020 FINDINGS: Tracheostomy catheter tip is 6.7 cm above the carina. ECMO catheter tip is in the inferior vena cava. Left jugular catheter tip is in the superior vena cava. No pneumothorax. There is widespread airspace opacity bilaterally. There is stable cardiomegaly. There is pulmonary venous hypertension. No adenopathy appreciable. Postoperative change noted in the lower cervical spine. IMPRESSION:  Tube and catheter positions as described without pneumothorax. Persistent widespread airspace opacity throughout the lungs bilaterally. Suspect multifocal pneumonia with questionable degree of superimposed edema and/or ARDS. There is stable cardiomegaly with pulmonary vascular congestion. Electronically Signed   By: Lowella Grip III M.D.   On: 02/22/2020 08:27     Medications:     Scheduled Medications: . amiodarone  200 mg Per Tube Daily  . vitamin C  500 mg Per Tube Daily  . chlorhexidine gluconate (MEDLINE KIT)  15 mL Mouth Rinse BID  . Chlorhexidine Gluconate Cloth  6 each Topical Q0600  . clonazePAM  1 mg Per Tube Q6H  . collagenase   Topical Daily  . docusate  100 mg Per Tube BID  . feeding supplement (PROSource TF)  45 mL Per Tube QID  . Gerhardt's butt cream   Topical BID  . influenza vac split quadrivalent PF  0.5 mL Intramuscular Tomorrow-1000  . insulin aspart  0-15 Units Subcutaneous Q4H  . insulin aspart  7 Units Subcutaneous Q4H  . insulin detemir  32 Units Subcutaneous BID  . linagliptin  5 mg Per Tube Daily  . mouth rinse  15 mL Mouth Rinse 10 times per day  . melatonin  9 mg Per Tube QHS  . metoCLOPramide (REGLAN) injection  5 mg Intravenous Q8H  . midodrine  10 mg Per Tube TID  . multivitamin  1 tablet Per Tube QHS  . oxyCODONE  10 mg Per Tube Q6H  . pantoprazole (PROTONIX) IV  40 mg Intravenous BID  . pneumococcal 23 valent vaccine  0.5 mL Intramuscular Tomorrow-1000  . polyethylene glycol  17 g Per Tube Daily  . QUEtiapine  100 mg Per Tube QHS  . sennosides  5 mL Per Tube BID  . zinc sulfate  220 mg Per Tube Daily    Infusions: .  prismasol BGK 4/2.5 400 mL/hr at 02/22/20 2125  .  prismasol BGK 4/2.5 200 mL/hr at 02/22/20 1509  . sodium chloride 500 mL (02/11/20 2215)  . sodium chloride Stopped (02/23/20 0733)  . sodium chloride    . sodium chloride    . albumin human 12.5 g (02/23/20 0512)  . albumin human Stopped (02/06/20 2000)  . feeding  supplement (PIVOT 1.5 CAL) 65 mL/hr at 02/23/20 0700  . heparin 10,000 units/ 20 mL infusion syringe 500 Units/hr (02/22/20 1511)  . heparin 850 Units/hr (02/23/20 0800)  . prismasol BGK 4/2.5 1,500 mL/hr at 02/23/20 0658  . sodium phosphate  Dextrose 5% IVPB 43 mL/hr at 02/23/20 0800    PRN Medications: sodium chloride, sodium chloride, sodium chloride, sodium chloride, acetaminophen (TYLENOL) oral liquid 160  mg/5 mL, albumin human, albuterol, alteplase, alum & mag hydroxide-simeth, dextrose, fentaNYL (SUBLIMAZE) injection, guaiFENesin-dextromethorphan, heparin, heparin, heparin, hydrALAZINE, HYDROmorphone (DILAUDID) injection, hydrOXYzine, lidocaine (PF), lidocaine-prilocaine, ondansetron **OR** ondansetron (ZOFRAN) IV, pentafluoroprop-tetrafluoroeth   Assessment/Plan:   1. Acute hypoxic/hypercapneic respiratory failure in setting of severe COVID PNA/ARDS -> VV ECMO - admit 8/2 - intubation 8/3 - has received actmera (compelted 8/2), remdesivir (completed 8/6) and steroids - Cannulated for VV ECMO on 8/9 - Extubated 8/16. Reintubated 8/24 - s/p trach 8/30. - Circuit changed 8/24 due to concern for hemolysis and worsening oxygenation. - CVVHD started 8/22 for volume removal and uremia. Remains anuric. CXR worse on 9/9 so switched back to CVVHD. Now keeping at -50/hr. CXR looks better this am. Keep even to -50 as circuit permits - Off IV sedation. Continue melatonin and seroquel for sleep at night. Adjusted to help with restlessness and sundowning at night - Off pressors and on midodrine 10 tid. Has wide pulse pressure but no AI on echo.  - Lung function returning very slowly. CXR slightly improved. Sweep still high though. Continue to try and wean slowly.  - Main issues remain to be poor lung recovery, critical care myopathy and AKI. Continue slow wean as tolerated. Need to prioritize ECMO wean then vent wean.  - Continue heparin with level 0.2-0.3 Discussed dosing with PharmD  personally.   2. Enterococcus sepsis - Finished high-dose zosyn on 9/7 per ID - Eraxis added on 8/26 with yeast in BAL 8/24 (ended 9/2)  - Cultures repeated with increasing pressor demands - F/u cx 9/7 Remain NGTD.  - No change today  3. Intracranial hemorrhage - ? Septic emboli - repeat head CT on 8/14 with stable bleeds but increased edema - neurology has seen. Suspect significant long-term injury sustained. Will follow commands. Appears to have dense LUE weakness and possibly LLE - repeat head CT stable 8/16 - tolerating heparin. No change  4. Thrombocytopenia - PLTs 100-120k   - Continue to follow.  - Stable   5. Morbid obesity - Body mass index is Body mass index is 42.84 kg/m.  6. Poorly controlled DM2 - HgBA1c 10.7 - IV insulin converted to SQ insulin on 9/15  7. PAF - intermittent episodes. Last 8/26 - Now on po. Quiescent  8. AKI/azotemia - CVVHD started on 8/22 - Down 70 pounds.  - transitioned to [facility] on 9/7 - switched back to CVVHD for better volume removal on 9/9. - new catheter placed 9/9 - will eventually need tunneled access - Keep UF rate -25 to - 50 as above. No changes  9. Emesis - Cor-trak out.  - Has GJ tube now, tolerating TFs  10. Sacral decub  - Wound care following  11. Necrotic fingers - VVS following - suspect will auto amputate  12. Anemia - Transfuse hgb < 8 - Given 1u this am   CRITICAL CARE Performed by: Glori Bickers  Total critical care time: 35 minutes  Critical care time was exclusive of separately billable procedures and treating other patients.  Critical care was necessary to treat or prevent imminent or life-threatening deterioration.  Critical care was time spent personally by me (independent of midlevel providers or residents) on the following activities: development of treatment plan with patient and/or surrogate as well as nursing, discussions with consultants, evaluation of patient's response to  treatment, examination of patient, obtaining history from patient or surrogate, ordering and performing treatments and interventions, ordering and review of laboratory studies, ordering and review of radiographic studies, pulse  oximetry and re-evaluation of patient's condition.   Length of Stay: 52   Glori Bickers  MD 02/23/2020, 8:38 AM  Advanced Heart Failure Team Pager [phone] (M-F; [place])  Please contact [facility] for night-coverage after hours (4p -7a ) and weekends on [email or web address]

## 2020-02-23 NOTE — Progress Notes (Signed)
CRRT filter clotted at this time. Change of filter. Pt tolerated well. RN will continue to monitor.

## 2020-02-23 NOTE — Progress Notes (Signed)
[place] for Heparin Indication: ECMO  Allergies  Allergen Reactions  . Sulfa Antibiotics Rash and Other (See Comments)    Patient Measurements: Height: 5\' 4"  (162.6 cm) Weight: 113.2 kg (249 lb 9 oz) IBW/kg (Calculated) : 54.7 Heparin Dosing Weight: 87 kg  Vital Signs: Temp: 97.8 F (36.6 C) (09/16 2300) Temp Source: Oral (09/16 2300) BP: 93/43 (09/17 0331) Pulse Rate: 104 (09/17 0800)  Labs: Recent Labs    02/22/20 0422 02/22/20 0436 02/22/20 1609 02/22/20 1624 02/23/20 0307 02/23/20 0307 02/23/20 0320 02/23/20 0625 02/23/20 0745  HGB 7.8*   < > 9.7*   < > 10.3*   < > 10.9*  --  10.5*  HCT 26.3*   < > 32.1*   < > 33.3*  --  32.0*  --  31.0*  PLT 102*  --  101*  --  109*  --   --   --   --   APTT 80*  --  77*  --  51*  --   --   --   --   HEPARINUNFRC 0.14*  --  0.16*  --   --   --   --  0.12*  --   CREATININE 0.93  --  0.94  --  1.02*  --   --   --   --    < > = values in this interval not displayed.    Estimated Creatinine Clearance: 76.8 mL/min (A) (by C-G formula based on SCr of 1.02 mg/dL (H)).  Assessment: 54 yo female COVID+ on VV ECMO. Patient started on bivalirudin but was found to have multiple ICH on 8/13 head CT. Bivalirudin was stopped and pt was off anticoagulation at that time. Her ECMO circuit was changed 8/24. Pharmacy asked to start low-fixed rate heparin. Heparin infusion was held last week due to bleeding from trach site. Heparin was started at low fixed rate for pump patency in setting of ICH. Concern with increased  clotting off crrt so will start to titrate heparin to a low goal of  (~0.15-0.2 heparin levels).  Heparin level slightly below goal this morning at 0.12, CBC stable, fibrinogen up slightly.  Goal of Therapy:  HL 0.15-0.2  Monitor platelets by anticoagulation protocol: Yes   Plan:  Increase heparin to 900 units/h q12h heparin level, CBC  Arrie Senate, PharmD, BCPS Clinical  Pharmacist [phone] Please check AMION for all [facility] Pharmacy numbers 02/23/2020

## 2020-02-23 NOTE — Progress Notes (Signed)
Pt requiring increase in sweep gases. Post oxygenator gas was pulled and results determined that patient required circuit change. Perfusionist, ECMO Specialist, Pharmacy, Icard MD and Bensimhon MD at bedside for circuit change. Tubing and new connections sterially prepped. Heparin bolus of 4,000 units given.Time out called at  1640; at [facility] Perfusionist was in room to start and stop Centrimag console, Idelia Salm RN/ECMO Specialist and Loreta Ave RN/ECMO Specialist clamped out lines. ECMO resumed with 1 minute 10 seconds, 1646pm. RPMS resumed at 4100 and LMP 4.17. Patient sating 99% post circuit change.

## 2020-02-23 NOTE — Progress Notes (Signed)
RN unable to pull volume from CRRT at this time due to chugging. 1 bottle of Albumin given. Pt responded well.

## 2020-02-23 NOTE — Progress Notes (Signed)
Assisted tele visit to patient with husband.  Amiri Tritch Harold, RN  

## 2020-02-23 NOTE — Progress Notes (Signed)
NAME:  Alisha Stephenson, MRN:  [id number], DOB:  1965/10/31, LOS: 59 ADMISSION DATE:  01/31/2020, CONSULTATION DATE:  8/3 REFERRING MD:  Alfredia Ferguson, CHIEF COMPLAINT:  Dyspnea   Brief History   54 y/o female admitted on 8/2 with severe acute respiratory failure with hypoxemia due to COVID 19 pneumonia.  She developed symptoms 1 week prior to admission.  Past Medical History  DM2 Diverticulitis Gallstones Ovarian cyst NAFLD Asthma  Significant Hospital Events   8/2 admit 8/3 ICU transfer, intubated 8/4 prone, paralyze 8/9 significant desaturations today 8/9 VV ECMO cannulation 8/13 ICH 8/30 trach  Consults:  PCCM ECMO team  Procedures:  8/3 ETT > 8/30 8/30 8-0 shiley trach 8/3 PICC >  8/9 LIJ MML 8/9 RIJ Crescent 55F  9/8 trialysis 9/10 PEG-J (IR)  Significant Diagnostic Tests:  7/31 CT head > NAICP 7/31 MRI/MRA brain > no acute changes, possibly small aneurysm ACOM 8/13 CT head> multiple areas of ICH   Micro Data:  8/2 blood > NG 8/2 SARS COV 2 > positive 8/4 resp > negative 8/4 urine >  8/12 blood > E. faecalis (pan-sensitive) 8/12 resp: staph aureus> MSSA 8/25 blood>> NG 8/24 BAL> yeast  Antimicrobials:  See fever tab for past abx Current: none  Interim history/subjective:   No issues overnight. Sitting up in bed. Alert to voice. Follows commands   Objective   Blood pressure (!) 93/43, pulse (!) 103, temperature 97.8 F (36.6 C), temperature source Oral, resp. rate (!) 31, height _0  (1.626 m), weight 113.2 kg, SpO2 92 %.    Vent Mode: PCV FiO2 (%):  [40 %] 40 % Set Rate:  [15 bmp] 15 bmp PEEP:  [10 cmH20] 10 cmH20   Intake/Output Summary (Last 24 hours) at 02/23/2020 0659 Last data filed at 02/23/2020 0600 Gross per 24 hour  Intake 3004.82 ml  Output 4298 ml  Net -1293.18 ml   Filed Weights   02/20/20 0500 02/21/20 0500 02/22/20 0410  Weight: 110.8 kg 115.9 kg 113.2 kg    Examination:  GEN: obese fm, critically ill  HEENT: NCAT,  tracking  Neck: trach in place, rij vv cannula  CV: RRR no mrg  PULM: BL vented breaths,  GI: obese, soft, peg site clear  EXT: right hand with dry finger 1 and thumb gangrene  NEURO: alert following commands, no focal deficit  PSYCH: alert to voice, nods head, motivated to work with pt today  Derm: right hand dry gangrene   Pressure Injury 02/14/20 Anus Medial Deep Tissue Pressure Injury - Purple or maroon localized area of discolored intact skin or blood-filled blister due to damage of underlying soft tissue from pressure and/or shear. (Active)  02/14/20 1300  Location: Anus  Location Orientation: Medial  Staging: Deep Tissue Pressure Injury - Purple or maroon localized area of discolored intact skin or blood-filled blister due to damage of underlying soft tissue from pressure and/or shear.  Wound Description (Comments):   Present on Admission: No     Pressure Injury Back Left;Mid Stage 2 -  Partial thickness loss of dermis presenting as a shallow open injury with a red, pink wound bed without slough. in skin fold on back (Active)     Location: Back  Location Orientation: Left;Mid  Staging: Stage 2 -  Partial thickness loss of dermis presenting as a shallow open injury with a red, pink wound bed without slough.  Wound Description (Comments): in skin fold on back  Present on Admission: No     Pressure  Injury 02/21/20 Sacrum Posterior Unstageable - Full thickness tissue loss in which the base of the injury is covered by slough (yellow, tan, gray, green or brown) and/or eschar (tan, brown or black) in the wound bed. Unstageable Sacral Wound (Active)  02/21/20 1206  Location: Sacrum  Location Orientation: Posterior  Staging: Unstageable - Full thickness tissue loss in which the base of the injury is covered by slough (yellow, tan, gray, green or brown) and/or eschar (tan, brown or black) in the wound bed.  Wound Description (Comments): Unstageable Sacral Wound  Present on Admission: No    Labs: reviewed  CXR: slight improvement in aeration  Resolved Hospital Problem list   MSSA pneumonia Fungal pneumonia- Candida dublinensis Enterococcal bacteremia-recent blood cultures cleared Concern for possible embolic phenomenon- fingers and possibly CVAs were embolic  Assessment & Plan:   Critically ill due to acute hypoxic and AoC hypercapneic respiratory failure; likely underly OHS.  Requiring VV ECMO support and mechanical ventilation.  ARDS due to COVID 19 pneumonia Tracheostomy in place - continue cc ecmo support  - slowly titrating sweep gas down  - VAP ppx - off sedation  - trach care    Elevated LDH, hypo-fibrinogenemia and thrombocytopenia -- related to circuit - continue to follow  - no significant build up     ICH, multifocal. L-sided weakness.  Critical illness myopathy--improving slowly. - continue PT OT SLP  - this will be a slow recovery   Acute metabolic encephalopathy, improving Sundowning in the evenings - delirium precautions - remains on seroquel   Acute renal failure, severe uremia.  Anuric. - continue cvvhd   Hypotension. Acute cor pulmonale.  Possibly related to critical illness. - off pressors  - MAP goal > 65  Hyperglycemia > not controlled at all with [place] insulin despite aggressive upward titration.  Assume poor absorption, potentially due to subcutaneous edema.   - uptitrate - goal CBG <180  Pressure Injury, Skin - per documentation above  Best practice:  Diet: tube feeding Pain/Anxiety/Delirium protocol (if indicated): as above VAP protocol (if indicated): yes DVT prophylaxis: SCDs , heparin fixed dose GI prophylaxis: pantoprazole Glucose control: ssi/basal Mobility: bed rest Code Status: limited Family Communication: I spoke with vernon at bedside this morning Disposition: ICU  This patient is critically ill with multiple organ system failure; which, requires frequent high complexity decision making, assessment,  support, evaluation, and titration of therapies. This was completed through the application of advanced monitoring technologies and extensive interpretation of multiple databases. During this encounter critical care time was devoted to patient care services described in this note for 34 minutes.  [place] [facility] 02/23/2020 6:59 AM

## 2020-02-23 NOTE — Procedures (Signed)
Extracorporeal support note  ECLS support day: Cannulated 02/03/2020, Day 39 Indication: COVID19 ARDS  Configuration: ECMO Mode: VV  Drainage cannula: Right IJ Crescent  Return cannula: Same   Pump speed: 4700 rpm  Pump flow: Flow (LPM): 4.7 (chattering)  Pump used: ECMO Device: Centrimag  Oxygenator: Quadrox O2 blender: 100% Sweep gas: 10L  ABG    Component Value Date/Time   PHART 7.358 02/23/2020 0320   PCO2ART 48.5 (H) 02/23/2020 0320   PO2ART 53 (L) 02/23/2020 0320   HCO3 27.4 02/23/2020 0320   TCO2 29 02/23/2020 0320   ACIDBASEDEF 1.0 02/15/2020 1947   O2SAT 86.0 02/23/2020 0320    Circuit check: clear, discussed with perfusionist  Anticoagulant: heparin  Anticoagulation targets: Anticoagulation Goal: hep lvl 0.15-0.2  Changes in support: no change today, except weaning sweep gas as tolerated  Continue PT/OT/SLP  Anticipated goals/duration of support: bridge to recovery   Garner Nash, DO [place] [facility] 02/23/2020 7:09 AM

## 2020-02-24 ENCOUNTER — Inpatient Hospital Stay (HOSPITAL_COMMUNITY): Payer: PRIVATE HEALTH INSURANCE

## 2020-02-24 LAB — CBC
HCT: 29.6 % — ABNORMAL LOW (ref 36.0–46.0)
HCT: 30.3 % — ABNORMAL LOW (ref 36.0–46.0)
HCT: 30.2 % — ABNORMAL LOW (ref 36.0–46.0)
Hemoglobin: 9.3 g/dL — ABNORMAL LOW (ref 12.0–15.0)
Hemoglobin: 9.3 g/dL — ABNORMAL LOW (ref 12.0–15.0)
Hemoglobin: 9.2 g/dL — ABNORMAL LOW (ref 12.0–15.0)
MCH: 31.8 pg (ref 26.0–34.0)
MCH: 31.1 pg (ref 26.0–34.0)
MCH: 31 pg (ref 26.0–34.0)
MCHC: 31.4 g/dL (ref 30.0–36.0)
MCHC: 30.7 g/dL (ref 30.0–36.0)
MCHC: 30.5 g/dL (ref 30.0–36.0)
MCV: 101.4 fL — ABNORMAL HIGH (ref 80.0–100.0)
MCV: 101.3 fL — ABNORMAL HIGH (ref 80.0–100.0)
MCV: 101.7 fL — ABNORMAL HIGH (ref 80.0–100.0)
Platelets: 94 10*3/uL — ABNORMAL LOW (ref 150–400)
Platelets: 100 10*3/uL — ABNORMAL LOW (ref 150–400)
Platelets: 97 10*3/uL — ABNORMAL LOW (ref 150–400)
RBC: 2.92 MIL/uL — ABNORMAL LOW (ref 3.87–5.11)
RBC: 2.99 MIL/uL — ABNORMAL LOW (ref 3.87–5.11)
RBC: 2.97 MIL/uL — ABNORMAL LOW (ref 3.87–5.11)
RDW: 23.6 % — ABNORMAL HIGH (ref 11.5–15.5)
RDW: 23.4 % — ABNORMAL HIGH (ref 11.5–15.5)
RDW: 23.5 % — ABNORMAL HIGH (ref 11.5–15.5)
WBC: 8.5 10*3/uL (ref 4.0–10.5)
WBC: 8.4 10*3/uL (ref 4.0–10.5)
WBC: 8 10*3/uL (ref 4.0–10.5)
nRBC: 1.3 % — ABNORMAL HIGH (ref 0.0–0.2)
nRBC: 1 % — ABNORMAL HIGH (ref 0.0–0.2)
nRBC: 1.6 % — ABNORMAL HIGH (ref 0.0–0.2)

## 2020-02-24 LAB — POCT I-STAT 7, (LYTES, BLD GAS, ICA,H+H)
Acid-Base Excess: 0 mmol/L (ref 0.0–2.0)
Acid-Base Excess: 3 mmol/L — ABNORMAL HIGH (ref 0.0–2.0)
Acid-Base Excess: 0 mmol/L (ref 0.0–2.0)
Acid-Base Excess: 1 mmol/L (ref 0.0–2.0)
Acid-Base Excess: 1 mmol/L (ref 0.0–2.0)
Bicarbonate: 25.2 mmol/L (ref 20.0–28.0)
Bicarbonate: 27.8 mmol/L (ref 20.0–28.0)
Bicarbonate: 25.9 mmol/L (ref 20.0–28.0)
Bicarbonate: 26.8 mmol/L (ref 20.0–28.0)
Bicarbonate: 26.7 mmol/L (ref 20.0–28.0)
Calcium, Ion: 1.27 mmol/L (ref 1.15–1.40)
Calcium, Ion: 1.22 mmol/L (ref 1.15–1.40)
Calcium, Ion: 1.24 mmol/L (ref 1.15–1.40)
Calcium, Ion: 1.21 mmol/L (ref 1.15–1.40)
Calcium, Ion: 1.22 mmol/L (ref 1.15–1.40)
HCT: 29 % — ABNORMAL LOW (ref 36.0–46.0)
HCT: 29 % — ABNORMAL LOW (ref 36.0–46.0)
HCT: 30 % — ABNORMAL LOW (ref 36.0–46.0)
HCT: 29 % — ABNORMAL LOW (ref 36.0–46.0)
HCT: 30 % — ABNORMAL LOW (ref 36.0–46.0)
Hemoglobin: 9.9 g/dL — ABNORMAL LOW (ref 12.0–15.0)
Hemoglobin: 9.9 g/dL — ABNORMAL LOW (ref 12.0–15.0)
Hemoglobin: 10.2 g/dL — ABNORMAL LOW (ref 12.0–15.0)
Hemoglobin: 9.9 g/dL — ABNORMAL LOW (ref 12.0–15.0)
Hemoglobin: 10.2 g/dL — ABNORMAL LOW (ref 12.0–15.0)
O2 Saturation: 81 %
O2 Saturation: 86 %
O2 Saturation: 86 %
O2 Saturation: 85 %
O2 Saturation: 91 %
Patient temperature: 98.4
Patient temperature: 98.2
Patient temperature: 97.8
Patient temperature: 98.1
Potassium: 4.6 mmol/L (ref 3.5–5.1)
Potassium: 4.7 mmol/L (ref 3.5–5.1)
Potassium: 4.8 mmol/L (ref 3.5–5.1)
Potassium: 4.9 mmol/L (ref 3.5–5.1)
Potassium: 4.8 mmol/L (ref 3.5–5.1)
Sodium: 137 mmol/L (ref 135–145)
Sodium: 137 mmol/L (ref 135–145)
Sodium: 137 mmol/L (ref 135–145)
Sodium: 137 mmol/L (ref 135–145)
Sodium: 138 mmol/L (ref 135–145)
TCO2: 26 mmol/L (ref 22–32)
TCO2: 29 mmol/L (ref 22–32)
TCO2: 27 mmol/L (ref 22–32)
TCO2: 28 mmol/L (ref 22–32)
TCO2: 28 mmol/L (ref 22–32)
pCO2 arterial: 41.6 mmHg (ref 32.0–48.0)
pCO2 arterial: 44 mmHg (ref 32.0–48.0)
pCO2 arterial: 47.5 mmHg (ref 32.0–48.0)
pCO2 arterial: 45.1 mmHg (ref 32.0–48.0)
pCO2 arterial: 44.9 mmHg (ref 32.0–48.0)
pH, Arterial: 7.39 (ref 7.350–7.450)
pH, Arterial: 7.408 (ref 7.350–7.450)
pH, Arterial: 7.342 — ABNORMAL LOW (ref 7.350–7.450)
pH, Arterial: 7.381 (ref 7.350–7.450)
pH, Arterial: 7.382 (ref 7.350–7.450)
pO2, Arterial: 45 mmHg — ABNORMAL LOW (ref 83.0–108.0)
pO2, Arterial: 51 mmHg — ABNORMAL LOW (ref 83.0–108.0)
pO2, Arterial: 53 mmHg — ABNORMAL LOW (ref 83.0–108.0)
pO2, Arterial: 51 mmHg — ABNORMAL LOW (ref 83.0–108.0)
pO2, Arterial: 63 mmHg — ABNORMAL LOW (ref 83.0–108.0)

## 2020-02-24 LAB — RENAL FUNCTION PANEL
Albumin: 3.2 g/dL — ABNORMAL LOW (ref 3.5–5.0)
Albumin: 3.2 g/dL — ABNORMAL LOW (ref 3.5–5.0)
Anion gap: 10 (ref 5–15)
Anion gap: 10 (ref 5–15)
BUN: 37 mg/dL — ABNORMAL HIGH (ref 6–20)
BUN: 38 mg/dL — ABNORMAL HIGH (ref 6–20)
CO2: 24 mmol/L (ref 22–32)
CO2: 24 mmol/L (ref 22–32)
Calcium: 9.2 mg/dL (ref 8.9–10.3)
Calcium: 9 mg/dL (ref 8.9–10.3)
Chloride: 101 mmol/L (ref 98–111)
Chloride: 101 mmol/L (ref 98–111)
Creatinine, Ser: 1.01 mg/dL — ABNORMAL HIGH (ref 0.44–1.00)
Creatinine, Ser: 1.09 mg/dL — ABNORMAL HIGH (ref 0.44–1.00)
GFR calc Af Amer: >60 mL/min (ref >60)
GFR calc Af Amer: >60 mL/min (ref >60)
GFR calc non Af Amer: >60 mL/min (ref >60)
GFR calc non Af Amer: 57 mL/min — ABNORMAL LOW (ref >60)
Glucose, Bld: 217 mg/dL — ABNORMAL HIGH (ref 70–99)
Glucose, Bld: 249 mg/dL — ABNORMAL HIGH (ref 70–99)
Phosphorus: 2.9 mg/dL (ref 2.5–4.6)
Phosphorus: 2.7 mg/dL (ref 2.5–4.6)
Potassium: 4.7 mmol/L (ref 3.5–5.1)
Potassium: 4.9 mmol/L (ref 3.5–5.1)
Sodium: 135 mmol/L (ref 135–145)
Sodium: 135 mmol/L (ref 135–145)

## 2020-02-24 LAB — LACTIC ACID, PLASMA
Lactic Acid, Venous: 1.8 mmol/L (ref 0.5–1.9)
Lactic Acid, Venous: 1.4 mmol/L (ref 0.5–1.9)

## 2020-02-24 LAB — LACTATE DEHYDROGENASE: LDH: 305 U/L — ABNORMAL HIGH (ref 98–192)

## 2020-02-24 LAB — APTT
aPTT: 99 seconds — ABNORMAL HIGH (ref 24–36)
aPTT: 79 seconds — ABNORMAL HIGH (ref 24–36)

## 2020-02-24 LAB — GLUCOSE, CAPILLARY
Glucose-Capillary: 205 mg/dL — ABNORMAL HIGH (ref 70–99)
Glucose-Capillary: 212 mg/dL — ABNORMAL HIGH (ref 70–99)
Glucose-Capillary: 218 mg/dL — ABNORMAL HIGH (ref 70–99)
Glucose-Capillary: 225 mg/dL — ABNORMAL HIGH (ref 70–99)
Glucose-Capillary: 242 mg/dL — ABNORMAL HIGH (ref 70–99)
Glucose-Capillary: 246 mg/dL — ABNORMAL HIGH (ref 70–99)
Glucose-Capillary: 251 mg/dL — ABNORMAL HIGH (ref 70–99)

## 2020-02-24 LAB — HEPARIN LEVEL (UNFRACTIONATED)
Heparin Unfractionated: 0.19 IU/mL — ABNORMAL LOW (ref 0.30–0.70)
Heparin Unfractionated: 0.19 IU/mL — ABNORMAL LOW (ref 0.30–0.70)

## 2020-02-24 LAB — MRSA PCR SCREENING: MRSA by PCR: NEGATIVE

## 2020-02-24 LAB — MAGNESIUM: Magnesium: 3 mg/dL — ABNORMAL HIGH (ref 1.7–2.4)

## 2020-02-24 LAB — FIBRINOGEN: Fibrinogen: 453 mg/dL (ref 210–475)

## 2020-02-24 MED ORDER — CALCIUM CHLORIDE 10 % IV SOLN
INTRAVENOUS | Status: AC
  Administered 2020-02-24: 1 g via INTRAVENOUS
  Filled 2020-02-24: qty 10

## 2020-02-24 MED ORDER — CALCIUM CHLORIDE 10 % IV SOLN: 1.0000 g | Freq: Once | INTRAVENOUS | Status: AC

## 2020-02-24 NOTE — Progress Notes (Signed)
NAME:  Alisha Stephenson, MRN:  [id number], DOB:  12/12/1965, LOS: 101 ADMISSION DATE:  01/18/2020, CONSULTATION DATE:  8/3 REFERRING MD:  Alfredia Ferguson, CHIEF COMPLAINT:  Dyspnea   Brief History   54 y/o female admitted on 8/2 with severe acute respiratory failure with hypoxemia due to COVID 19 pneumonia.  She developed symptoms 1 week prior to admission.  Past Medical History  DM2 Diverticulitis Gallstones Ovarian cyst NAFLD Asthma  Significant Hospital Events   8/2 admit 8/3 ICU transfer, intubated 8/4 prone, paralyze 8/9 significant desaturations today 8/9 VV ECMO cannulation 8/13 ICH 8/30 trach  Consults:  PCCM ECMO team  Procedures:  8/3 ETT > 8/30 8/30 8-0 shiley trach 8/3 PICC >  8/9 LIJ MML 8/9 RIJ Crescent 27F  9/8 trialysis 9/10 PEG-J (IR)  Significant Diagnostic Tests:  7/31 CT head > NAICP 7/31 MRI/MRA brain > no acute changes, possibly small aneurysm ACOM 8/13 CT head> multiple areas of ICH   Micro Data:  8/2 blood > NG 8/2 SARS COV 2 > positive 8/4 resp > negative 8/4 urine >  8/12 blood > E. faecalis (pan-sensitive) 8/12 resp: staph aureus> MSSA 8/25 blood>> NG 8/24 BAL> yeast  Antimicrobials:  See fever tab for past abx Current: none  Interim history/subjective:   No issues overnight.  Tolerated circuit change yesterday.  Family updated at bedside yesterday  Objective   Blood pressure (!) 115/47, pulse (!) 101, temperature 98.4 F (36.9 C), temperature source Oral, resp. rate 16, height _0  (1.626 m), weight 112.9 kg, SpO2 (!) 88 %.    Vent Mode: PCV FiO2 (%):  [40 %] 40 % Set Rate:  [15 bmp] 15 bmp PEEP:  [10 cmH20] 10 cmH20   Intake/Output Summary (Last 24 hours) at 02/24/2020 0700 Last data filed at 02/24/2020 0600 Gross per 24 hour  Intake 3112.79 ml  Output 1655 ml  Net 1457.79 ml   Filed Weights   02/21/20 0500 02/22/20 0410 02/24/20 0300  Weight: 115.9 kg 113.2 kg 112.9 kg    Examination:  GEN: Obese elderly female  critically ill on ECMO, tracheostomy on vent HEENT: NCAT, tracking appropriately Neck: Tracheostomy tube in place, right IJ VV cannula CV: Regular rate rhythm, S1-S2 PULM: Bilateral mechanically ventilated breath sounds GI: obese, soft nontender PEG site clear EXT: Right hand with index and thumb dry gangrene distal NEURO: Alert following commands able to communicate mouth words nod head yes and no PSYCH: Alert Derm: Right hand dry gangrene as described above  Pressure Injury 02/14/20 Anus Medial Deep Tissue Pressure Injury - Purple or maroon localized area of discolored intact skin or blood-filled blister due to damage of underlying soft tissue from pressure and/or shear. (Active)  02/14/20 1300  Location: Anus  Location Orientation: Medial  Staging: Deep Tissue Pressure Injury - Purple or maroon localized area of discolored intact skin or blood-filled blister due to damage of underlying soft tissue from pressure and/or shear.  Wound Description (Comments):   Present on Admission: No     Pressure Injury Back Left;Mid Stage 2 -  Partial thickness loss of dermis presenting as a shallow open injury with a red, pink wound bed without slough. in skin fold on back (Active)     Location: Back  Location Orientation: Left;Mid  Staging: Stage 2 -  Partial thickness loss of dermis presenting as a shallow open injury with a red, pink wound bed without slough.  Wound Description (Comments): in skin fold on back  Present on Admission: No  Pressure Injury 02/21/20 Sacrum Posterior Unstageable - Full thickness tissue loss in which the base of the injury is covered by slough (yellow, tan, gray, green or brown) and/or eschar (tan, brown or black) in the wound bed. Unstageable Sacral Wound (Active)  02/21/20 1206  Location: Sacrum  Location Orientation: Posterior  Staging: Unstageable - Full thickness tissue loss in which the base of the injury is covered by slough (yellow, tan, gray, green or brown)  and/or eschar (tan, brown or black) in the wound bed.  Wound Description (Comments): Unstageable Sacral Wound  Present on Admission: No   Labs: Reviewed  CXR: 02/24/2020 Persistent bilateral infiltrates, slightly worsened. The patient's images have been independently reviewed by me.    Resolved Hospital Problem list   MSSA pneumonia Fungal pneumonia- Candida dublinensis Enterococcal bacteremia-recent blood cultures cleared Concern for possible embolic phenomenon- fingers and possibly CVAs were embolic  Assessment & Plan:   Critically ill due to acute hypoxic and AoC hypercapneic respiratory failure; likely underly OHS.   Requiring VV ECMO support and mechanical ventilation.  ARDS due to COVID 19 pneumonia Tracheostomy in place -Continue VV ECMO support -Continue to titrate down sweep gas as tolerated -Ventilator associated pneumonia prophylaxis -Head of bed elevated as tolerated. -Remains off sedation -Continue routine trach care  Elevated LDH, hypo-fibrinogenemia and thrombocytopenia -Related to circuit -No significant fibrin buildup in lines  ICH, multifocal. L-sided weakness.  Critical illness myopathy--improving slowly. -Continue PT OT SLP -This will be patient's largest barrier to recovery, despite persistent hypoxemia  Acute metabolic encephalopathy, improving Sundowning in the evenings -Delirium precautions -Awake during the day, promote normal sleep-wake cycle -Lights on and stimulation during the day.  Acute renal failure, severe uremia.  Anuric. -Continue CVVHD  Hypotension. Acute cor pulmonale, improved -Remains off vasopressors -Mean arterial pressure goal greater than 65 mmHg.  Hyperglycemia  -Uptitrate insulin -Goal CBG 140-180  Pressure Injury, Skin -Per flowsheet documentation above.  Best practice:  Diet: tube feeding Pain/Anxiety/Delirium protocol (if indicated): as above VAP protocol (if indicated): yes DVT prophylaxis: SCDs ,  heparin GI prophylaxis: pantoprazole Glucose control: ssi/basal Mobility: bed rest Code Status: limited Family Communication: Patient's husband [name] will be updated. Disposition: ICU   This patient is critically ill with multiple organ system failure; which, requires frequent high complexity decision making, assessment, support, evaluation, and titration of therapies. This was completed through the application of advanced monitoring technologies and extensive interpretation of multiple databases. During this encounter critical care time was devoted to patient care services described in this note for 33 minutes.  Garner Nash, DO [place] [facility] 02/24/2020 7:00 AM

## 2020-02-24 NOTE — Progress Notes (Signed)
[place] for Heparin Indication: ECMO  Allergies  Allergen Reactions  . Sulfa Antibiotics Rash and Other (See Comments)    Patient Measurements: Height: 5\' 4"  (162.6 cm) Weight: 112.9 kg (248 lb 14.4 oz) IBW/kg (Calculated) : 54.7 Heparin Dosing Weight: 87 kg  Vital Signs: Temp: 98.4 F (36.9 C) (09/18 0400) Temp Source: Oral (09/18 0400) BP: 102/52 (09/18 0837) Pulse Rate: 101 (09/18 0837)  Labs: Recent Labs    02/23/20 0307 02/23/20 0320 02/23/20 3570 02/23/20 0745 02/23/20 1609 02/23/20 1826 02/24/20 0437 02/24/20 0437 02/24/20 0452 02/24/20 0758  HGB 10.3*   < >  --    < > 9.7*   < > 9.3*   < > 9.9* 9.9*  HCT 33.3*   < >  --    < > 31.5*   < > 29.6*  --  29.0* 29.0*  PLT 109*  --   --   --  101*  --  94*  --   --   --   APTT 51*  --   --   --  71*  --  99*  --   --   --   HEPARINUNFRC  --   --  0.12*  --  0.17*  --  0.19*  --   --   --   CREATININE 1.02*  --   --   --  1.07*  --  1.01*  --   --   --    < > = values in this interval not displayed.    Estimated Creatinine Clearance: 77.5 mL/min (A) (by C-G formula based on SCr of 1.01 mg/dL (H)).  Assessment: 54 yo female COVID+ on VV ECMO. Patient started on bivalirudin but was found to have multiple ICH on 8/13 head CT. Bivalirudin was stopped and pt was off anticoagulation at that time. Her ECMO circuit was changed 8/24. Pharmacy asked to start low-fixed rate heparin. Heparin infusion was held last week due to bleeding from trach site. Heparin was started at low fixed rate for pump patency in setting of ICH. Concern with increased  clotting off crrt as well as circuit clots - s/p circuit exchange 9/17 so will start to titrate heparin to a low goal of 0.2-0.3  Heparin level 0.19 this AM, just below established goal range.  No overt bleeding or complications noted.  Hgb stable, Pltc slow trend down, 94 today.  Fibrinogen, LDH fairly stable.  Goal of Therapy:  HL  0.2-0.3 Monitor platelets by anticoagulation protocol: Yes   Plan:  Increase IV heparin to 950 units/hr. Continue heparin level, CBC q 12 hrs.  [place] Crane, Roylene Reason, BCCP Clinical Pharmacist  02/24/2020 8:48 AM   [facility] pharmacy phone numbers are listed on [email or web address]

## 2020-02-24 NOTE — Procedures (Signed)
Extracorporeal support note  ECLS support day: Cannulated 01/28/2020, Day 40 Indication: COVID19 ARDS  Configuration: ECMO Mode: VV  Drainage cannula: Right IJ Crescent  Return cannula: Same   Pump speed: 4100 rpm  Pump flow: Flow (LPM): 5.04  Pump used: ECMO Device: Centrimag  Oxygenator: Quadrox O2 blender: 100% Sweep gas: 10L  ABG    Component Value Date/Time   PHART 7.390 02/24/2020 0452   PCO2ART 41.6 02/24/2020 0452   PO2ART 45 (L) 02/24/2020 0452   HCO3 25.2 02/24/2020 0452   TCO2 26 02/24/2020 0452   ACIDBASEDEF 1.0 02/23/2020 1826   O2SAT 81.0 02/24/2020 0452    Circuit check: discussed with bedside ecmo specialist  Anticoagulant: heparin discussed with pharmacy  Anticoagulation targets: Anticoagulation Goal: Hep 0.15- 0.2  Changes in support: Weaning sweep, PT/OT/SLP, progressive mobility   Anticipated goals/duration of support: Bridge to recovery   Garner Nash, DO [place] [facility] 02/24/2020 7:06 AM

## 2020-02-24 NOTE — Progress Notes (Signed)
[facility] Progress Note    Assessment/ Plan:    33F severe MSOF from COVID-19 pneumonia requiring VV ECMO.  1.  Anuric AKI due to shock, sepsis causing acute tubular necrosis:CRRT on 8/22- 02/12/20. IHD 02/13/20.  Planned for 9/9 but back to CRRT d/t fluid overload.  L IJ nontunneled catheter changed 9/9 from L [place].   - Continue CRRT.  On All 4K - Watching K.  Reduced dialysate to 1.5 L/hr from 2 on 9/13. - UF goal net neg 50 mL/ hr as tolerated  - Note renal added flat rate heparin 500 u/ hr to circuit 02/18/20 d/t clotting x 2.  No issues ongoing.    2.Shock due to sepsis, Covid infection, blood cultures positive for Enterococcus bacteremia: s/p Zosyn (ended 9/7), cultures repeated 9/7, NGTD.  TTE 9/7 no AI, done for concern of wide pulse pressure  3.Acute hypoxic and hypercapnic respiratory failure, ARDS due to COVID-19 pneumonia: on VV ECMO for refractory hypoxemia.  3.Hypernatremia, hypervolemia: relative free water deficit. Improved with CRRT.  4.Acute toxic metabolic encephalopathy:Multifactorial, improving  5.Anemia, thrombocytopenia: With hemolysis likely and acute illness contributing. Transfusions per primary team.Plt low stable since adding heparin - cont to monitor.  6.Intracranial hemorrhage: With left-sided weakness, ? Septic emboli  7.  Diabetes per primary   8.  Hypophosphatemia repleting as needed; repletion again on 9/17 am, ok this AM  9.  Critical illness myopathy: PT as able per primary team    Subjective:    Seen and examined on CRRT.   Awake, asking for food.  UF1.5L yesterday, was net + 1.5L  Review of systems   Denies Shortness of breath /no air hunger Denies chest pain  Denies n/v   Objective:   BP (!) 102/52   Pulse 99   Temp 97.8 F (36.6 C)   Resp (!) 21   Ht _0  (1.626 m)   Wt 112.9 kg   SpO2 (!) 88%   BMI 42.72 kg/m   Intake/Output Summary (Last 24 hours) at 02/24/2020 1219 Last data filed at 02/24/2020  1100 Gross per 24 hour  Intake 2886.69 ml  Output 1523 ml  Net 1363.69 ml   Weight change:   Physical Exam:   Gen: awake, lying in bed in NAD  NECK: R IJ ECMO access CVS: RRR Resp: diminished on auscultation  Abd: obese habitus nontender Ext: trace edema bilaterally ACCESS: L IJ nontunneled HD catheter MSK: necrotic thumb and 1st finger R hand Psych no anxiety or agitation Neuro - alert and oriented - provides hx and follows commands  Imaging: DG CHEST PORT 1 VIEW  Result Date: 02/24/2020 CLINICAL DATA:  Patient on ECMO. EXAM: PORTABLE CHEST 1 VIEW COMPARISON:  February 23, 2020 FINDINGS: The left central line, tracheostomy tube, right PICC line, an ECMO device are stable. No pneumothorax. Diffuse bilateral pulmonary infiltrates are more pronounced in the interval. No other changes. IMPRESSION: 1. Support apparatus as above. 2. Diffuse bilateral pulmonary infiltrates appear worsened in the interval. Electronically Signed   By: Dorise Bullion III M.D   On: 02/24/2020 10:03   DG CHEST PORT 1 VIEW  Result Date: 02/23/2020 CLINICAL DATA:  Respiratory failure, ECMO EXAM: PORTABLE CHEST 1 VIEW COMPARISON:  02/22/2020 FINDINGS: Support devices are stable. Extensive bilateral airspace disease. No change. Mild cardiomegaly. No visible effusions or pneumothorax. IMPRESSION: Support devices stable. Stable extensive diffuse bilateral airspace disease. Electronically Signed   By: Rolm Baptise M.D.   On: 02/23/2020 06:50    Labs:  BMET Recent Labs  Lab 02/21/20 0257 02/21/20 0535 02/21/20 1558 02/21/20 1712 02/22/20 0422 02/22/20 0436 02/22/20 1609 02/22/20 1624 02/23/20 0307 02/23/20 0320 02/23/20 1609 02/23/20 1826 02/23/20 2017 02/24/20 0437 02/24/20 0452 02/24/20 0758 02/24/20 1211  NA 134*   < > 136   < > 136   < > 136   < > 135   < > 133* 135 136 135 137 137 137  K 4.1   < > 4.1   < > 4.1   < > 4.4   < > 4.7   < > 4.8 5.2* 5.0 4.7 4.6 4.7 4.8  CL 101  --  102  --  102   --  101  --  101  --  97*  --   --  101  --   --   --   CO2 24  --  25  --  25  --  25  --  25  --  24  --   --  24  --   --   --   GLUCOSE 184*  --  166*  --  198*  --  214*  --  219*  --  263*  --   --  217*  --   --   --   BUN 27*  --  27*  --  26*  --  27*  --  30*  --  34*  --   --  37*  --   --   --   CREATININE 1.03*  --  0.99  --  0.93  --  0.94  --  1.02*  --  1.07*  --   --  1.01*  --   --   --   CALCIUM 8.3*  --  8.5*  --  8.4*  --  8.7*  --  9.0  --  8.8*  --   --  9.2  --   --   --   PHOS 3.1  --  2.2*  --  2.4*  --  3.6  --  2.1*  --  4.6  --   --  2.9  --   --   --    < > = values in this interval not displayed.   CBC Recent Labs  Lab 02/22/20 1609 02/22/20 1624 02/23/20 0307 02/23/20 0320 02/23/20 1609 02/23/20 1826 02/24/20 0437 02/24/20 0452 02/24/20 0758 02/24/20 1211  WBC 8.8  --  9.9  --  10.6*  --  8.5  --   --   --   HGB 9.7*   < > 10.3*   < > 9.7*   < > 9.3* 9.9* 9.9* 10.2*  HCT 32.1*   < > 33.3*   < > 31.5*   < > 29.6* 29.0* 29.0* 30.0*  MCV 103.9*  --  104.4*  --  103.6*  --  101.4*  --   --   --   PLT 101*  --  109*  --  101*  --  94*  --   --   --    < > = values in this interval not displayed.    Medications:    . amiodarone  200 mg Per Tube Daily  . vitamin C  500 mg Per Tube Daily  . chlorhexidine gluconate (MEDLINE KIT)  15 mL Mouth Rinse BID  . Chlorhexidine Gluconate Cloth  6 each Topical Q0600  . clonazePAM  1 mg Per Tube Q6H  .  collagenase   Topical Daily  . docusate  100 mg Per Tube BID  . feeding supplement (PROSource TF)  45 mL Per Tube QID  . Gerhardt's butt cream   Topical BID  . insulin aspart  0-15 Units Subcutaneous Q4H  . insulin aspart  7 Units Subcutaneous Q4H  . insulin detemir  32 Units Subcutaneous BID  . linagliptin  5 mg Per Tube Daily  . mouth rinse  15 mL Mouth Rinse 10 times per day  . melatonin  9 mg Per Tube QHS  . metoCLOPramide (REGLAN) injection  5 mg Intravenous Q8H  . midodrine  10 mg Per Tube TID  .  multivitamin  1 tablet Per Tube QHS  . oxyCODONE  10 mg Per Tube Q6H  . pantoprazole (PROTONIX) IV  40 mg Intravenous BID  . pneumococcal 23 valent vaccine  0.5 mL Intramuscular Tomorrow-1000  . polyethylene glycol  17 g Per Tube Daily  . QUEtiapine  100 mg Per Tube QHS  . sennosides  5 mL Per Tube BID  . zinc sulfate  220 mg Per Tube Daily     Justin Mend MD 02/24/2020, 12:19 PM

## 2020-02-24 NOTE — Progress Notes (Signed)
Assisted tele visit to patient with husband.  Alisha Hockett McEachran, RN  

## 2020-02-24 NOTE — Progress Notes (Signed)
Patient ID: Alisha Stephenson, female   DOB: [birth year], 54 y.o.   MRN: [id number]    Advanced Heart Failure Rounding Note   Subjective:    - 8/2 COVID + test - 8/9 Cannulated for VV ECMO - 8/13 with several areas of intracranial hemorrhage. Bival stopped.  - 8/14 CT no change in [place]. Increased edema - 8/16 Extubated - 8/16 Head CT stable bleed - 8/22 CVVHD started - 8/24 reintubated - 8/24 circuit changed - 8/30 trach - 9/7  Switched to iHD - 02/15/20 Switched back to CVVHD - 9/10 GJ tube placed - 9/17 Circuit change  Remains awake on vent via trach. Asking how much longer. Following commands and able to move more, working with PT/OT daily. Sitting on side bed. Speech following as well for PMV.   Had circuit change yesterday. No improvement in sweep.   CXR worse today  Remains on CVVHD. Anuric Pulling 25-50  Remains off pressors. On po midodrine. Wide pulse pressure. No AI on echo.    ECMO  Flow 5.0 L RPM 4100 Sweep12  Labs: 7.39/41/45/81% 40% FiO2 TV 150 cc Hgb9.3 PLT 94k LDH 315 -> 374 -> 383 -> 331 -> 356 -> 305 Lactic acid1.6 -> 2.0 -> 1.8   Objective:   Weight Range:  Vital Signs:   Temp:  [97.4 F (36.3 C)-98.6 F (37 C)] 98.4 F (36.9 C) (09/18 0400) Pulse Rate:  [97-110] 102 (09/18 0800) Resp:  [16-31] 20 (09/18 0700) BP: (101-115)/(43-48) 115/47 (09/17 2350) SpO2:  [81 %-97 %] 84 % (09/18 0800) Arterial Line BP: (92-134)/(40-60) 111/54 (09/18 0800) FiO2 (%):  [40 %] 40 % (09/18 0400) Weight:  [112.9 kg] 112.9 kg (09/18 0300) Last BM Date: 02/23/20  Weight change: Filed Weights   02/21/20 0500 02/22/20 0410 02/24/20 0300  Weight: 115.9 kg 113.2 kg 112.9 kg    Intake/Output:   Intake/Output Summary (Last 24 hours) at 02/24/2020 0815 Last data filed at 02/24/2020 0800 Gross per 24 hour  Intake 3202.57 ml  Output 1754 ml  Net 1448.57 ml     Physical Exam: General:  Lying in bed awake. Following commands HEENT: normal Neck: RIJ ECMO  cannula + trach Cor: PMI nondisplaced. Regular rate & rhythm. No rubs, gallops or murmurs. Lungs: mild crackles Abdomen: obese + PEG soft, nontender, nondistended. No hepatosplenomegaly. No bruits or masses. Good bowel sounds. Extremities: no cyanosis, clubbing, rash, tr edema  Ischemic fingers Neuro: awake following commands. Diffusely weak   Telemetry: Sinus 90-105 Personally reviewed   Labs: Basic Metabolic Panel: Recent Labs  Lab 02/20/20 0232 02/20/20 0233 02/21/20 0257 02/21/20 0535 02/22/20 0422 02/22/20 0436 02/22/20 1609 02/22/20 1624 02/23/20 0307 02/23/20 0320 02/23/20 1609 02/23/20 1826 02/23/20 2017 02/24/20 0437 02/24/20 0452  NA  --    < > 134*   < > 136   < > 136   < > 135   < > 133* 135 136 135 137  K  --    < > 4.1   < > 4.1   < > 4.4   < > 4.7   < > 4.8 5.2* 5.0 4.7 4.6  CL  --    < > 101   < > 102  --  101  --  101  --  97*  --   --  101  --   CO2  --    < > 24   < > 25  --  25  --  25  --  24  --   --  24  --   GLUCOSE  --    < > 184*   < > 198*  --  214*  --  219*  --  263*  --   --  217*  --   BUN  --    < > 27*   < > 26*  --  27*  --  30*  --  34*  --   --  37*  --   CREATININE  --    < > 1.03*   < > 0.93  --  0.94  --  1.02*  --  1.07*  --   --  1.01*  --   CALCIUM  --    < > 8.3*   < > 8.4*   < > 8.7*   < > 9.0  --  8.8*  --   --  9.2  --   MG 2.5*  --  2.6*  --  2.7*  --   --   --  3.0*  --   --   --   --  3.0*  --   PHOS  --    < > 3.1   < > 2.4*  --  3.6  --  2.1*  --  4.6  --   --  2.9  --    < > = values in this interval not displayed.    Liver Function Tests: Recent Labs  Lab 02/22/20 0422 02/22/20 1609 02/23/20 0307 02/23/20 1609 02/24/20 0437  ALBUMIN 3.0* 3.3* 3.3* 3.5 3.2*   No results for input(s): LIPASE, AMYLASE in the last 168 hours. No results for input(s): AMMONIA in the last 168 hours.  CBC: Recent Labs  Lab 02/22/20 0422 02/22/20 0436 02/22/20 1609 02/22/20 1624 02/23/20 0307 02/23/20 0320 02/23/20 1609  02/23/20 1609 02/23/20 1826 02/23/20 1946 02/23/20 2017 02/24/20 0437 02/24/20 0452  WBC 6.9  --  8.8  --  9.9  --  10.6*  --   --   --   --  8.5  --   HGB 7.8*   < > 9.7*   < > 10.3*   < > 9.7*   < > 10.5* 9.9* 10.9* 9.3* 9.9*  HCT 26.3*   < > 32.1*   < > 33.3*   < > 31.5*   < > 31.0* 32.0* 32.0* 29.6* 29.0*  MCV 109.6*  --  103.9*  --  104.4*  --  103.6*  --   --   --   --  101.4*  --   PLT 102*  --  101*  --  109*  --  101*  --   --   --   --  94*  --    < > = values in this interval not displayed.    Cardiac Enzymes: No results for input(s): CKTOTAL, CKMB, CKMBINDEX, TROPONINI in the last 168 hours.  BNP: BNP (last 3 results) No results for input(s): BNP in the last 8760 hours.  ProBNP (last 3 results) No results for input(s): PROBNP in the last 8760 hours.    Other results:  Imaging: DG CHEST PORT 1 VIEW  Result Date: 02/23/2020 CLINICAL DATA:  Respiratory failure, ECMO EXAM: PORTABLE CHEST 1 VIEW COMPARISON:  02/22/2020 FINDINGS: Support devices are stable. Extensive bilateral airspace disease. No change. Mild cardiomegaly. No visible effusions or pneumothorax. IMPRESSION: Support devices stable. Stable extensive diffuse bilateral airspace disease. Electronically Signed   By: Lennette Bihari  [place] M.D.   On: 02/23/2020 06:50     Medications:     Scheduled Medications: . amiodarone  200 mg Per Tube Daily  . vitamin C  500 mg Per Tube Daily  . chlorhexidine gluconate (MEDLINE KIT)  15 mL Mouth Rinse BID  . Chlorhexidine Gluconate Cloth  6 each Topical Q0600  . clonazePAM  1 mg Per Tube Q6H  . collagenase   Topical Daily  . docusate  100 mg Per Tube BID  . feeding supplement (PROSource TF)  45 mL Per Tube QID  . Gerhardt's butt cream   Topical BID  . influenza vac split quadrivalent PF  0.5 mL Intramuscular Tomorrow-1000  . insulin aspart  0-15 Units Subcutaneous Q4H  . insulin aspart  7 Units Subcutaneous Q4H  . insulin detemir  32 Units Subcutaneous BID  . linagliptin   5 mg Per Tube Daily  . mouth rinse  15 mL Mouth Rinse 10 times per day  . melatonin  9 mg Per Tube QHS  . metoCLOPramide (REGLAN) injection  5 mg Intravenous Q8H  . midodrine  10 mg Per Tube TID  . multivitamin  1 tablet Per Tube QHS  . oxyCODONE  10 mg Per Tube Q6H  . pantoprazole (PROTONIX) IV  40 mg Intravenous BID  . pneumococcal 23 valent vaccine  0.5 mL Intramuscular Tomorrow-1000  . polyethylene glycol  17 g Per Tube Daily  . QUEtiapine  100 mg Per Tube QHS  . sennosides  5 mL Per Tube BID  . zinc sulfate  220 mg Per Tube Daily    Infusions: .  prismasol BGK 4/2.5 400 mL/hr at 02/24/20 0000  .  prismasol BGK 4/2.5 200 mL/hr at 02/23/20 1608  . sodium chloride 500 mL (02/11/20 2215)  . sodium chloride 10 mL/hr at 02/24/20 0800  . sodium chloride    . sodium chloride    . albumin human 12.5 g (02/24/20 0114)  . albumin human Stopped (02/06/20 2000)  . feeding supplement (PIVOT 1.5 CAL) 65 mL/hr at 02/23/20 0700  . heparin 10,000 units/ 20 mL infusion syringe 500 Units/hr (02/24/20 0434)  . heparin 900 Units/hr (02/24/20 0800)  . prismasol BGK 4/2.5 1,500 mL/hr at 02/24/20 0701    PRN Medications: sodium chloride, sodium chloride, sodium chloride, sodium chloride, acetaminophen (TYLENOL) oral liquid 160 mg/5 mL, albumin human, albuterol, alteplase, alum & mag hydroxide-simeth, dextrose, fentaNYL (SUBLIMAZE) injection, guaiFENesin-dextromethorphan, heparin, heparin, heparin, hydrALAZINE, HYDROmorphone (DILAUDID) injection, hydrOXYzine, lidocaine (PF), lidocaine-prilocaine, ondansetron **OR** ondansetron (ZOFRAN) IV, pentafluoroprop-tetrafluoroeth   Assessment/Plan:   1. Acute hypoxic/hypercapneic respiratory failure in setting of severe COVID PNA/ARDS -> VV ECMO - admit 8/2 - intubation 8/3 - has received actmera (compelted 8/2), remdesivir (completed 8/6) and steroids - Cannulated for VV ECMO on 8/9 - Extubated 8/16. Reintubated 8/24 - s/p trach 8/30. - Circuit changed  8/24 due to concern for hemolysis and worsening oxygenation. Repeat circuit change 9/17 - CVVHD started 8/22 for volume removal and uremia. Remains anuric. CXR worse on 9/9 so switched back to CVVHD. Now keeping at -50/hr. CXRwas improving. Now worse today. Unclear if related to circuit change.  - Off IV sedation. Continue melatonin and seroquel for sleep at night. Adjusted to help with restlessness and sundowning at night - Off pressors and on midodrine 10 tid. Has wide pulse pressure but no AI on echo.  - Lung function returning very slowly. Sweep still high. Not improved with circuit change. Wean as tolerated.  - Main issues remain to be  poor lung recovery, critical care myopathy and AKI. Continue slow wean as tolerated. Need to prioritize ECMO wean then vent wean.  - Continue heparin with level 0.2-0.3 Discussed dosing with PharmD personally.  2. Enterococcus sepsis - Finished high-dose zosyn on 9/7 per ID - Eraxis added on 8/26 with yeast in BAL 8/24 (ended 9/2)  - Cultures repeated with increasing pressor demands - F/u cx 9/7 Remain NGTD.  - No change today  3. Intracranial hemorrhage - ? Septic emboli - repeat head CT on 8/14 with stable bleeds but increased edema - neurology has seen. Suspect significant long-term injury sustained. Will follow commands. Appears to have dense LUE weakness and possibly LLE - repeat head CT stable 8/16 - tolerating heparin. No change  4. Thrombocytopenia - PLTs 94k   - Continue to follow.  - Stable   5. Morbid obesity - Body mass index is Body mass index is 42.72 kg/m.  6. Poorly controlled DM2 - HgBA1c 10.7 - IV insulin converted to SQ insulin on 9/15  7. PAF - intermittent episodes. Last 8/26 - Now on po. Quiescent  8. AKI/azotemia - CVVHD started on 8/22 - Down 70 pounds.  - [facility] 9[facility] - switched back to CVVHD for better volume removal on 9/9. - new catheter placed 9/9 - will eventually need tunneled access -  Keep UF rate -25 to - 50 as above. No change  9. Emesis - Cor-trak out.  - Has GJ tube now, tolerating TFs  10. Sacral decub  - Wound care following. Watch for infection   11. Necrotic fingers - VVS following - suspect will auto amputate  12. Anemia - Transfuse hgb < 8   CRITICAL CARE Performed by: Glori Bickers  Total critical care time: 35 minutes  Critical care time was exclusive of separately billable procedures and treating other patients.  Critical care was necessary to treat or prevent imminent or life-threatening deterioration.  Critical care was time spent personally by me (independent of midlevel providers or residents) on the following activities: development of treatment plan with patient and/or surrogate as well as nursing, discussions with consultants, evaluation of patient's response to treatment, examination of patient, obtaining history from patient or surrogate, ordering and performing treatments and interventions, ordering and review of laboratory studies, ordering and review of radiographic studies, pulse oximetry and re-evaluation of patient's condition.   Length of Stay: [place]  MD 02/24/2020, 8:15 AM  Advanced Heart Failure Team Pager [phone] (M-F; [place])  Please contact [facility] for night-coverage after hours (4p -7a ) and weekends on [email or web address]

## 2020-02-24 NOTE — Progress Notes (Signed)
ANTICOAGULATION CONSULT NOTE Pharmacy Consult for Heparin Indication: ECMO  Allergies  Allergen Reactions   Sulfa Antibiotics Rash and Other (See Comments)    Patient Measurements: Height: 5\' 4"  (162.6 cm) Weight: 112.9 kg (248 lb 14.4 oz) IBW/kg (Calculated) : 54.7 Heparin Dosing Weight: 87 kg  Vital Signs: Temp: 98.1 F (36.7 C) (09/18 1600) BP: 102/52 (09/18 0837) Pulse Rate: 100 (09/18 1900)  Labs: Recent Labs    02/23/20 1609 02/23/20 1826 02/24/20 0437 02/24/20 0452 02/24/20 1211 02/24/20 1211 02/24/20 1724 02/24/20 1734  HGB 9.7*   < > 9.3*   < > 10.2*   < > 9.3* 9.9*  HCT 31.5*   < > 29.6*   < > 30.0*  --  30.3* 29.0*  PLT 101*  --  94*  --   --   --  100*  --   APTT 71*  --  99*  --   --   --  79*  --   HEPARINUNFRC 0.17*  --  0.19*  --   --   --  0.19*  --   CREATININE 1.07*  --  1.01*  --   --   --  1.09*  --    < > = values in this interval not displayed.    Estimated Creatinine Clearance: 71.8 mL/min (A) (by C-G formula based on SCr of 1.09 mg/dL (H)).  Assessment: 54 yo female COVID+ on VV ECMO. Patient started on bivalirudin but was found to have multiple ICH on 8/13 head CT. Bivalirudin was stopped and pt was off anticoagulation at that time. Her ECMO circuit was changed 8/24. Pharmacy asked to start low-fixed rate heparin. Heparin infusion was held last week due to bleeding from trach site. Heparin was started at low fixed rate for pump patency in setting of ICH. Concern with increased  clotting off crrt as well as circuit clots - s/p circuit exchange 9/17 so will start to titrate heparin to a low goal of 0.2-0.3  Heparin level 0.19 just below goal this evening on heparin drip 950 uts/hr.   SHe is also recieving set rate heparin 500 uts/hr into CRT circuit.  No overt bleeding or complications noted.  Hgb stable, Pltc slow trend down, 94 today.  Fibrinogen, LDH fairly stable.  Goal of Therapy:  HL 0.2-0.3 Monitor platelets by anticoagulation  protocol: Yes   Plan:  Increase IV heparin to 100 units/hr. Continue heparin level, CBC q 12 hrs.  Bonnita Nasuti Pharm.D. CPP, BCPS Clinical Pharmacist [phone] 02/24/2020 8:00 PM    [facility] pharmacy phone numbers are listed on [email or web address]

## 2020-02-25 ENCOUNTER — Inpatient Hospital Stay (HOSPITAL_COMMUNITY): Payer: PRIVATE HEALTH INSURANCE

## 2020-02-25 LAB — TYPE AND SCREEN
ABO/RH(D): O POS
Antibody Screen: NEGATIVE
Unit division: 0
Unit division: 0
Unit division: 0
Unit division: 0
Unit division: 0
Unit division: 0
Unit division: 0
Unit division: 0
Unit division: 0
Unit division: 0

## 2020-02-25 LAB — BPAM RBC
Blood Product Expiration Date: 202109292359
Blood Product Expiration Date: 202110102359
Blood Product Expiration Date: 202110102359
Blood Product Expiration Date: 202110102359
Blood Product Expiration Date: 202110132359
Blood Product Expiration Date: 202110142359
Blood Product Expiration Date: 202110212359
Blood Product Expiration Date: 202110212359
Blood Product Expiration Date: 202110212359
Blood Product Expiration Date: 202110212359
ISSUE DATE / TIME: 202109151352
ISSUE DATE / TIME: 202109160749
ISSUE DATE / TIME: 202109171515
ISSUE DATE / TIME: 202109171515
ISSUE DATE / TIME: 202109172217
ISSUE DATE / TIME: 202109180100
Unit Type and Rh: 5100
Unit Type and Rh: 5100
Unit Type and Rh: 5100
Unit Type and Rh: 5100
Unit Type and Rh: 5100
Unit Type and Rh: 5100
Unit Type and Rh: 5100
Unit Type and Rh: 5100
Unit Type and Rh: 5100
Unit Type and Rh: 5100

## 2020-02-25 LAB — POCT I-STAT 7, (LYTES, BLD GAS, ICA,H+H)
Acid-Base Excess: 1 mmol/L (ref 0.0–2.0)
Acid-Base Excess: 1 mmol/L (ref 0.0–2.0)
Acid-Base Excess: 1 mmol/L (ref 0.0–2.0)
Acid-Base Excess: 0 mmol/L (ref 0.0–2.0)
Acid-base deficit: 2 mmol/L (ref 0.0–2.0)
Bicarbonate: 26.4 mmol/L (ref 20.0–28.0)
Bicarbonate: 27 mmol/L (ref 20.0–28.0)
Bicarbonate: 27.8 mmol/L (ref 20.0–28.0)
Bicarbonate: 27 mmol/L (ref 20.0–28.0)
Bicarbonate: 23.7 mmol/L (ref 20.0–28.0)
Calcium, Ion: 1.22 mmol/L (ref 1.15–1.40)
Calcium, Ion: 1.23 mmol/L (ref 1.15–1.40)
Calcium, Ion: 1.23 mmol/L (ref 1.15–1.40)
Calcium, Ion: 1.25 mmol/L (ref 1.15–1.40)
Calcium, Ion: 1.12 mmol/L — ABNORMAL LOW (ref 1.15–1.40)
HCT: 29 % — ABNORMAL LOW (ref 36.0–46.0)
HCT: 29 % — ABNORMAL LOW (ref 36.0–46.0)
HCT: 30 % — ABNORMAL LOW (ref 36.0–46.0)
HCT: 30 % — ABNORMAL LOW (ref 36.0–46.0)
HCT: 26 % — ABNORMAL LOW (ref 36.0–46.0)
Hemoglobin: 9.9 g/dL — ABNORMAL LOW (ref 12.0–15.0)
Hemoglobin: 9.9 g/dL — ABNORMAL LOW (ref 12.0–15.0)
Hemoglobin: 10.2 g/dL — ABNORMAL LOW (ref 12.0–15.0)
Hemoglobin: 10.2 g/dL — ABNORMAL LOW (ref 12.0–15.0)
Hemoglobin: 8.8 g/dL — ABNORMAL LOW (ref 12.0–15.0)
O2 Saturation: 89 %
O2 Saturation: 91 %
O2 Saturation: 94 %
O2 Saturation: 95 %
O2 Saturation: 85 %
Patient temperature: 98.6
Patient temperature: 97.9
Patient temperature: 97.6
Potassium: 4.6 mmol/L (ref 3.5–5.1)
Potassium: 4.5 mmol/L (ref 3.5–5.1)
Potassium: 4.5 mmol/L (ref 3.5–5.1)
Potassium: 4.9 mmol/L (ref 3.5–5.1)
Potassium: 4.2 mmol/L (ref 3.5–5.1)
Sodium: 138 mmol/L (ref 135–145)
Sodium: 137 mmol/L (ref 135–145)
Sodium: 138 mmol/L (ref 135–145)
Sodium: 137 mmol/L (ref 135–145)
Sodium: 141 mmol/L (ref 135–145)
TCO2: 28 mmol/L (ref 22–32)
TCO2: 28 mmol/L (ref 22–32)
TCO2: 29 mmol/L (ref 22–32)
TCO2: 29 mmol/L (ref 22–32)
TCO2: 25 mmol/L (ref 22–32)
pCO2 arterial: 43.2 mmHg (ref 32.0–48.0)
pCO2 arterial: 47.3 mmHg (ref 32.0–48.0)
pCO2 arterial: 53.2 mmHg — ABNORMAL HIGH (ref 32.0–48.0)
pCO2 arterial: 53.8 mmHg — ABNORMAL HIGH (ref 32.0–48.0)
pCO2 arterial: 42.9 mmHg (ref 32.0–48.0)
pH, Arterial: 7.394 (ref 7.350–7.450)
pH, Arterial: 7.365 (ref 7.350–7.450)
pH, Arterial: 7.323 — ABNORMAL LOW (ref 7.350–7.450)
pH, Arterial: 7.307 — ABNORMAL LOW (ref 7.350–7.450)
pH, Arterial: 7.351 (ref 7.350–7.450)
pO2, Arterial: 57 mmHg — ABNORMAL LOW (ref 83.0–108.0)
pO2, Arterial: 65 mmHg — ABNORMAL LOW (ref 83.0–108.0)
pO2, Arterial: 78 mmHg — ABNORMAL LOW (ref 83.0–108.0)
pO2, Arterial: 84 mmHg (ref 83.0–108.0)
pO2, Arterial: 52 mmHg — ABNORMAL LOW (ref 83.0–108.0)

## 2020-02-25 LAB — RENAL FUNCTION PANEL
Albumin: 3.2 g/dL — ABNORMAL LOW (ref 3.5–5.0)
Albumin: 3.4 g/dL — ABNORMAL LOW (ref 3.5–5.0)
Anion gap: 9 (ref 5–15)
Anion gap: 10 (ref 5–15)
BUN: 37 mg/dL — ABNORMAL HIGH (ref 6–20)
BUN: 35 mg/dL — ABNORMAL HIGH (ref 6–20)
CO2: 24 mmol/L (ref 22–32)
CO2: 25 mmol/L (ref 22–32)
Calcium: 8.8 mg/dL — ABNORMAL LOW (ref 8.9–10.3)
Calcium: 8.9 mg/dL (ref 8.9–10.3)
Chloride: 103 mmol/L (ref 98–111)
Chloride: 101 mmol/L (ref 98–111)
Creatinine, Ser: 0.99 mg/dL (ref 0.44–1.00)
Creatinine, Ser: 0.97 mg/dL (ref 0.44–1.00)
GFR calc Af Amer: >60 mL/min (ref >60)
GFR calc Af Amer: >60 mL/min (ref >60)
GFR calc non Af Amer: >60 mL/min (ref >60)
GFR calc non Af Amer: >60 mL/min (ref >60)
Glucose, Bld: 226 mg/dL — ABNORMAL HIGH (ref 70–99)
Glucose, Bld: 237 mg/dL — ABNORMAL HIGH (ref 70–99)
Phosphorus: 2.4 mg/dL — ABNORMAL LOW (ref 2.5–4.6)
Phosphorus: 3 mg/dL (ref 2.5–4.6)
Potassium: 4.5 mmol/L (ref 3.5–5.1)
Potassium: 4.8 mmol/L (ref 3.5–5.1)
Sodium: 136 mmol/L (ref 135–145)
Sodium: 136 mmol/L (ref 135–145)

## 2020-02-25 LAB — MAGNESIUM: Magnesium: 2.8 mg/dL — ABNORMAL HIGH (ref 1.7–2.4)

## 2020-02-25 LAB — CBC
HCT: 28.9 % — ABNORMAL LOW (ref 36.0–46.0)
HCT: 30.9 % — ABNORMAL LOW (ref 36.0–46.0)
Hemoglobin: 8.9 g/dL — ABNORMAL LOW (ref 12.0–15.0)
Hemoglobin: 9.3 g/dL — ABNORMAL LOW (ref 12.0–15.0)
MCH: 31.2 pg (ref 26.0–34.0)
MCH: 30.8 pg (ref 26.0–34.0)
MCHC: 30.8 g/dL (ref 30.0–36.0)
MCHC: 30.1 g/dL (ref 30.0–36.0)
MCV: 101.4 fL — ABNORMAL HIGH (ref 80.0–100.0)
MCV: 102.3 fL — ABNORMAL HIGH (ref 80.0–100.0)
Platelets: 100 10*3/uL — ABNORMAL LOW (ref 150–400)
Platelets: 99 10*3/uL — ABNORMAL LOW (ref 150–400)
RBC: 2.85 MIL/uL — ABNORMAL LOW (ref 3.87–5.11)
RBC: 3.02 MIL/uL — ABNORMAL LOW (ref 3.87–5.11)
RDW: 23.2 % — ABNORMAL HIGH (ref 11.5–15.5)
RDW: 23 % — ABNORMAL HIGH (ref 11.5–15.5)
WBC: 7.3 10*3/uL (ref 4.0–10.5)
WBC: 9 10*3/uL (ref 4.0–10.5)
nRBC: 1.2 % — ABNORMAL HIGH (ref 0.0–0.2)
nRBC: 0.7 % — ABNORMAL HIGH (ref 0.0–0.2)

## 2020-02-25 LAB — GLUCOSE, CAPILLARY
Glucose-Capillary: 138 mg/dL — ABNORMAL HIGH (ref 70–99)
Glucose-Capillary: 192 mg/dL — ABNORMAL HIGH (ref 70–99)
Glucose-Capillary: 204 mg/dL — ABNORMAL HIGH (ref 70–99)
Glucose-Capillary: 209 mg/dL — ABNORMAL HIGH (ref 70–99)
Glucose-Capillary: 242 mg/dL — ABNORMAL HIGH (ref 70–99)
Glucose-Capillary: 244 mg/dL — ABNORMAL HIGH (ref 70–99)

## 2020-02-25 LAB — BASIC METABOLIC PANEL
Anion gap: 11 (ref 5–15)
BUN: 39 mg/dL — ABNORMAL HIGH (ref 6–20)
CO2: 23 mmol/L (ref 22–32)
Calcium: 8.9 mg/dL (ref 8.9–10.3)
Chloride: 102 mmol/L (ref 98–111)
Creatinine, Ser: 1.69 mg/dL — ABNORMAL HIGH (ref 0.44–1.00)
GFR calc Af Amer: 39 mL/min — ABNORMAL LOW (ref >60)
GFR calc non Af Amer: 34 mL/min — ABNORMAL LOW (ref >60)
Glucose, Bld: 235 mg/dL — ABNORMAL HIGH (ref 70–99)
Potassium: 4.6 mmol/L (ref 3.5–5.1)
Sodium: 136 mmol/L (ref 135–145)

## 2020-02-25 LAB — HEPARIN LEVEL (UNFRACTIONATED)
Heparin Unfractionated: 0.21 IU/mL — ABNORMAL LOW (ref 0.30–0.70)
Heparin Unfractionated: 0.18 IU/mL — ABNORMAL LOW (ref 0.30–0.70)

## 2020-02-25 LAB — LACTIC ACID, PLASMA
Lactic Acid, Venous: 1.3 mmol/L (ref 0.5–1.9)
Lactic Acid, Venous: 1.6 mmol/L (ref 0.5–1.9)

## 2020-02-25 LAB — FIBRINOGEN: Fibrinogen: 464 mg/dL (ref 210–475)

## 2020-02-25 LAB — LACTATE DEHYDROGENASE: LDH: 291 U/L — ABNORMAL HIGH (ref 98–192)

## 2020-02-25 NOTE — Progress Notes (Signed)
Assisted tele visit to patient with husband.  Shatira Dobosz Harold, RN  

## 2020-02-25 NOTE — Progress Notes (Signed)
Patient ID: Alisha Stephenson, female   DOB: 05/29/1966, 54 y.o.   MRN: [id number]    Advanced Heart Failure Rounding Note   Subjective:    - 8/2 COVID + test - 8/9 Cannulated for VV ECMO - 8/13 with several areas of intracranial hemorrhage. Bival stopped.  - 8/14 CT no change in [place]. Increased edema - 8/16 Extubated - 8/16 Head CT stable bleed - 8/22 CVVHD started - 8/24 reintubated - 8/24 circuit changed - 8/30 trach - 9/7  Switched to iHD - 02/15/20 Switched back to CVVHD - 9/10 GJ tube placed - 9/17 Circuit change  Remains awake on vent via trach. Communicative. Following commands but very weak.   CXR worse today.   Remains on CVVHD. Anuric Pulling 25-50  Remains off pressors. On po midodrine. Wide pulse pressure. No AI on echo.   ECMO  Flow 5.0 L RPM 4100 Sweep10  Labs: 7.39/43/57/89% 40% FiO2 TV 180 cc Hgb8.9 PLT 100k LDH 315 -> 374 -> 383 -> 331 -> 356 -> 305 -> 291 Lactic acid1.6 -> 2.0 -> 1.8 -> 1.3 Heparin 0.21  Objective:   Weight Range:  Vital Signs:   Temp:  [97.5 F (36.4 C)-98.6 F (37 C)] 98.6 F (37 C) (09/19 0800) Pulse Rate:  [89-103] 92 (09/19 0800) Resp:  [14-21] 14 (09/19 0400) BP: (102)/(52) 102/52 (09/18 0837) SpO2:  [84 %-99 %] 97 % (09/19 0800) Arterial Line BP: (82-136)/(41-63) 123/56 (09/19 0800) FiO2 (%):  [40 %] 40 % (09/19 0458) Weight:  [113.5 kg] 113.5 kg (09/19 0452) Last BM Date: 02/24/20  Weight change: Filed Weights   02/22/20 0410 02/24/20 0300 02/25/20 0452  Weight: 113.2 kg 112.9 kg 113.5 kg    Intake/Output:   Intake/Output Summary (Last 24 hours) at 02/25/2020 0814 Last data filed at 02/25/2020 0800 Gross per 24 hour  Intake 2350.48 ml  Output 2119 ml  Net 231.48 ml     Physical Exam: General:  Awake on vent. Follows commands communicative HEENT: normal Neck: supple. RIJ ECMO  Carotids 2+ bilat; no bruits. No lymphadenopathy or thryomegaly appreciated. Cor: PMI nondisplaced. Regular rate & rhythm. No  rubs, gallops or murmurs. Lungs: minimal air movement  Abdomen: obese soft, nontender, nondistended. No hepatosplenomegaly. No bruits or masses. Good bowel sounds. Extremities: no cyanosis, clubbing, rash, trace edema Neuro: alert & orientedx3, cranial nerves grossly intact. Very weak   Telemetry: Sinus 90-100 Personally reviewed   Labs: Basic Metabolic Panel: Recent Labs  Lab 02/21/20 0257 02/21/20 0535 02/22/20 0422 02/22/20 0436 02/23/20 0307 02/23/20 0320 02/23/20 1609 02/23/20 1826 02/24/20 0437 02/24/20 0452 02/24/20 1724 02/24/20 1734 02/24/20 2016 02/24/20 2222 02/25/20 0451 02/25/20 0500 02/25/20 0802  NA 134*   < > 136   < > 135   < > 133*   < > 135   < > 135   < > 138 136 138 136 137  K 4.1   < > 4.1   < > 4.7   < > 4.8   < > 4.7   < > 4.9   < > 4.8 4.6 4.6 4.5 4.5  CL 101   < > 102   < > 101   < > 97*  --  101  --  101  --   --  102  --  103  --   CO2 24   < > 25   < > 25   < > 24  --  24  --  24  --   --  23  --  24  --   GLUCOSE 184*   < > 198*   < > 219*   < > 263*  --  217*  --  249*  --   --  235*  --  226*  --   BUN 27*   < > 26*   < > 30*   < > 34*  --  37*  --  38*  --   --  39*  --  37*  --   CREATININE 1.03*   < > 0.93   < > 1.02*   < > 1.07*  --  1.01*  --  1.09*  --   --  1.69*  --  0.99  --   CALCIUM 8.3*   < > 8.4*   < > 9.0   < > 8.8*   < > 9.2   < > 9.0  --   --  8.9  --  8.8*  --   MG 2.6*  --  2.7*  --  3.0*  --   --   --  3.0*  --   --   --   --   --   --  2.8*  --   PHOS 3.1   < > 2.4*   < > 2.1*  --  4.6  --  2.9  --  2.7  --   --   --   --  2.4*  --    < > = values in this interval not displayed.    Liver Function Tests: Recent Labs  Lab 02/23/20 0307 02/23/20 1609 02/24/20 0437 02/24/20 1724 02/25/20 0500  ALBUMIN 3.3* 3.5 3.2* 3.2* 3.2*   No results for input(s): LIPASE, AMYLASE in the last 168 hours. No results for input(s): AMMONIA in the last 168 hours.  CBC: Recent Labs  Lab 02/23/20 1609 02/23/20 1826 02/24/20 0437  02/24/20 0452 02/24/20 1724 02/24/20 1734 02/24/20 2016 02/24/20 2222 02/25/20 0421 02/25/20 0451 02/25/20 0802  WBC 10.6*  --  8.5  --  8.4  --   --  8.0 7.3  --   --   HGB 9.7*   < > 9.3*   < > 9.3*   < > 10.2* 9.2* 8.9* 9.9* 9.9*  HCT 31.5*   < > 29.6*   < > 30.3*   < > 30.0* 30.2* 28.9* 29.0* 29.0*  MCV 103.6*  --  101.4*  --  101.3*  --   --  101.7* 101.4*  --   --   PLT 101*  --  94*  --  100*  --   --  97* 100*  --   --    < > = values in this interval not displayed.    Cardiac Enzymes: No results for input(s): CKTOTAL, CKMB, CKMBINDEX, TROPONINI in the last 168 hours.  BNP: BNP (last 3 results) No results for input(s): BNP in the last 8760 hours.  ProBNP (last 3 results) No results for input(s): PROBNP in the last 8760 hours.    Other results:  Imaging: DG CHEST PORT 1 VIEW  Result Date: 02/24/2020 CLINICAL DATA:  Patient on ECMO. EXAM: PORTABLE CHEST 1 VIEW COMPARISON:  February 23, 2020 FINDINGS: The left central line, tracheostomy tube, right PICC line, an ECMO device are stable. No pneumothorax. Diffuse bilateral pulmonary infiltrates are more pronounced in the interval. No other changes. IMPRESSION: 1. Support apparatus as above. 2. Diffuse bilateral pulmonary infiltrates appear worsened in the  interval. Electronically Signed   By: Dorise Bullion III M.D   On: 02/24/2020 10:03     Medications:     Scheduled Medications: . amiodarone  200 mg Per Tube Daily  . vitamin C  500 mg Per Tube Daily  . chlorhexidine gluconate (MEDLINE KIT)  15 mL Mouth Rinse BID  . Chlorhexidine Gluconate Cloth  6 each Topical Q0600  . clonazePAM  1 mg Per Tube Q6H  . collagenase   Topical Daily  . docusate  100 mg Per Tube BID  . feeding supplement (PROSource TF)  45 mL Per Tube QID  . Gerhardt's butt cream   Topical BID  . insulin aspart  0-15 Units Subcutaneous Q4H  . insulin aspart  7 Units Subcutaneous Q4H  . insulin detemir  32 Units Subcutaneous BID  . linagliptin  5  mg Per Tube Daily  . mouth rinse  15 mL Mouth Rinse 10 times per day  . melatonin  9 mg Per Tube QHS  . metoCLOPramide (REGLAN) injection  5 mg Intravenous Q8H  . midodrine  10 mg Per Tube TID  . multivitamin  1 tablet Per Tube QHS  . oxyCODONE  10 mg Per Tube Q6H  . pantoprazole (PROTONIX) IV  40 mg Intravenous BID  . pneumococcal 23 valent vaccine  0.5 mL Intramuscular Tomorrow-1000  . polyethylene glycol  17 g Per Tube Daily  . QUEtiapine  100 mg Per Tube QHS  . sennosides  5 mL Per Tube BID  . zinc sulfate  220 mg Per Tube Daily    Infusions: .  prismasol BGK 4/2.5 400 mL/hr at 02/25/20 0004  .  prismasol BGK 4/2.5 200 mL/hr at 02/24/20 1741  . sodium chloride 500 mL (02/11/20 2215)  . sodium chloride Stopped (02/25/20 0739)  . sodium chloride Stopped (02/24/20 2312)  . sodium chloride    . albumin human Stopped (02/24/20 2306)  . albumin human Stopped (02/06/20 2000)  . feeding supplement (PIVOT 1.5 CAL) 1,000 mL (02/25/20 0504)  . heparin 10,000 units/ 20 mL infusion syringe 500 Units/hr (02/25/20 0004)  . heparin 1,000 Units/hr (02/25/20 0800)  . prismasol BGK 4/2.5 1,500 mL/hr at 02/25/20 0636    PRN Medications: sodium chloride, sodium chloride, sodium chloride, sodium chloride, acetaminophen (TYLENOL) oral liquid 160 mg/5 mL, albumin human, albuterol, alteplase, alum & mag hydroxide-simeth, dextrose, fentaNYL (SUBLIMAZE) injection, guaiFENesin-dextromethorphan, heparin, heparin, heparin, hydrALAZINE, HYDROmorphone (DILAUDID) injection, hydrOXYzine, lidocaine (PF), lidocaine-prilocaine, ondansetron **OR** ondansetron (ZOFRAN) IV, pentafluoroprop-tetrafluoroeth   Assessment/Plan:   1. Acute hypoxic/hypercapneic respiratory failure in setting of severe COVID PNA/ARDS -> VV ECMO - admit 8/2 - intubation 8/3 - has received actmera (compelted 8/2), remdesivir (completed 8/6) and steroids - Cannulated for VV ECMO on 8/9 - Extubated 8/16. Reintubated 8/24 - s/p trach  8/30. - Circuit changed 8/24 due to concern for hemolysis and worsening oxygenation. Repeat circuit change 9/17 - CVVHD started 8/22 for volume removal and uremia. Remains anuric. CXR worse on 9/9 so switched back to CVVHD. Now keeping at -50/hr. CXRwas improving. Now worse today. Unclear if related to circuit change.  - Off IV sedation. Continue melatonin and seroquel for sleep at night. Adjusted to help with restlessness and sundowning at night - Off pressors and on midodrine 10 tid. Has wide pulse pressure but no AI on echo.  - Lung function returning very slowly. I suspect major issue is respiratory muscle weakness and poor lung compliance. Continue to wean sweep. Suspect baseline pCO2 is in 50s. Ok to  let it drift up a bit  - Continue heparin with level 0.2-0.3 Discussed dosing with PharmD personally.  2. Enterococcus sepsis - Finished high-dose zosyn on 9/7 per ID - Eraxis added on 8/26 with yeast in BAL 8/24 (ended 9/2)  - Cultures repeated with increasing pressor demands - F/u cx 9/7 Remain NGTD.  - No change  3. Intracranial hemorrhage - ? Septic emboli - repeat head CT on 8/14 with stable bleeds but increased edema - neurology has seen. Suspect significant long-term injury sustained. Will follow commands. Appears to have dense LUE weakness and possibly LLE - repeat head CT stable 8/16 - tolerating heparin. No change  4. Thrombocytopenia - PLTs 100k   - Continue to follow.  - Stable   5. Morbid obesity - Body mass index is Body mass index is 42.95 kg/m.  6. Poorly controlled DM2 - HgBA1c 10.7 - IV insulin converted to SQ insulin on 9/15  7. PAF - intermittent episodes. Last 8/26 - Now on po. Quiescent  8. AKI/azotemia - CVVHD started on 8/22 - Down 70 pounds.  - transitioned to [facility] on 9/7 - switched back to CVVHD for better volume removal on 9/9. - new catheter placed 9/9 - will eventually need tunneled access - Keep UF rate -25 to - 50 as above. No  change  9. Emesis - Has GJ tube now, tolerating TFs  10. Sacral decub  - Wound care following. Watch for infection   11. Necrotic fingers - VVS following - suspect will auto amputate  12. Anemia - Transfuse hgb < 8   CRITICAL CARE Performed by: Glori Bickers  Total critical care time: 35 minutes  Critical care time was exclusive of separately billable procedures and treating other patients.  Critical care was necessary to treat or prevent imminent or life-threatening deterioration.  Critical care was time spent personally by me (independent of midlevel providers or residents) on the following activities: development of treatment plan with patient and/or surrogate as well as nursing, discussions with consultants, evaluation of patient's response to treatment, examination of patient, obtaining history from patient or surrogate, ordering and performing treatments and interventions, ordering and review of laboratory studies, ordering and review of radiographic studies, pulse oximetry and re-evaluation of patient's condition.   Length of Stay: 56   Glori Bickers  MD 02/25/2020, 8:14 AM  Advanced Heart Failure Team Pager [phone] (M-F; 7a - 4p)  Please contact [facility] for night-coverage after hours (4p -7a ) and weekends on [email or web address]

## 2020-02-25 NOTE — Progress Notes (Signed)
NAME:  Alisha Stephenson, MRN:  [id number], DOB:  01/05/1966, LOS: 34 ADMISSION DATE:  01/25/2020, CONSULTATION DATE:  8/3 REFERRING MD:  Alfredia Ferguson, CHIEF COMPLAINT:  Dyspnea   Brief History   54 y/o female admitted on 8/2 with severe acute respiratory failure with hypoxemia due to COVID 19 pneumonia.  She developed symptoms 1 week prior to admission.  Past Medical History  DM2 Diverticulitis Gallstones Ovarian cyst NAFLD Asthma  Significant Hospital Events   8/2 admit 8/3 ICU transfer, intubated 8/4 prone, paralyze 8/9 significant desaturations today 8/9 VV ECMO cannulation 8/13 ICH 8/30 trach  Consults:  PCCM ECMO team  Procedures:  8/3 ETT > 8/30 8/30 8-0 shiley trach 8/3 PICC >  8/9 LIJ MML 8/9 RIJ [place] 49F  9/8 trialysis 9/10 PEG-J (IR)  Significant Diagnostic Tests:  7/31 CT head > NAICP 7/31 MRI/MRA brain > no acute changes, possibly small aneurysm ACOM 8/13 CT head> multiple areas of ICH  Micro Data:  8/2 blood > NG 8/2 SARS COV 2 > positive 8/4 resp > negative 8/4 urine >  8/12 blood > E. faecalis (pan-sensitive) 8/12 resp: staph aureus> MSSA 8/25 blood>> NG 8/24 BAL> yeast  Antimicrobials:  See fever tab for past abx Current: none  Interim history/subjective:   No issues overnight. Comfortable on support. Family at bedside. Updated daughter.   Objective   Blood pressure (!) 102/52, pulse 92, temperature (!) 97.5 F (36.4 C), temperature source Oral, resp. rate 14, height _0  (1.626 m), weight 113.5 kg, SpO2 94 %.    Vent Mode: PCV FiO2 (%):  [40 %] 40 % Set Rate:  [15 bmp] 15 bmp PEEP:  [10 cmH20] 10 cmH20 Plateau Pressure:  [16 cmH20-30 cmH20] 30 cmH20   Intake/Output Summary (Last 24 hours) at 02/25/2020 0657 Last data filed at 02/25/2020 0600 Gross per 24 hour  Intake 2358.48 ml  Output 2235 ml  Net 123.48 ml   Filed Weights   02/22/20 0410 02/24/20 0300 02/25/20 0452  Weight: 113.2 kg 112.9 kg 113.5 kg     Examination:  GEN: Obese elderly fm, on life support and vv ecmo  HEENT: NCAT, tracking appropriately, following commands  Neck: trach in place, rij vv ecmo cannula  CV: RRR, s1 s2  PULM: BL mechanically ventilated breaths  GI: obese soft nt nd  EXT: right distal ext dry gangrene NEURO: alert, communicates, follows commands  PSYCH: calm  Derm: right hand dry gangrene   Pressure Injury 02/14/20 Anus Medial Deep Tissue Pressure Injury - Purple or maroon localized area of discolored intact skin or blood-filled blister due to damage of underlying soft tissue from pressure and/or shear. (Active)  02/14/20 1300  Location: Anus  Location Orientation: Medial  Staging: Deep Tissue Pressure Injury - Purple or maroon localized area of discolored intact skin or blood-filled blister due to damage of underlying soft tissue from pressure and/or shear.  Wound Description (Comments):   Present on Admission: No     Pressure Injury Back Left;Mid Stage 2 -  Partial thickness loss of dermis presenting as a shallow open injury with a red, pink wound bed without slough. in skin fold on back (Active)     Location: Back  Location Orientation: Left;Mid  Staging: Stage 2 -  Partial thickness loss of dermis presenting as a shallow open injury with a red, pink wound bed without slough.  Wound Description (Comments): in skin fold on back  Present on Admission: No     Pressure Injury 02/21/20 Sacrum  Posterior Unstageable - Full thickness tissue loss in which the base of the injury is covered by slough (yellow, tan, gray, green or brown) and/or eschar (tan, brown or black) in the wound bed. Unstageable Sacral Wound (Active)  02/21/20 1206  Location: Sacrum  Location Orientation: Posterior  Staging: Unstageable - Full thickness tissue loss in which the base of the injury is covered by slough (yellow, tan, gray, green or brown) and/or eschar (tan, brown or black) in the wound bed.  Wound Description  (Comments): Unstageable Sacral Wound  Present on Admission: No   Labs reviewed in Epic and with ECMO team at bedside   CXR: 02/25/2020 Persistent BL infiltrates, resolving ards, stable lines and tubes The patient's images have been independently reviewed by me.       Resolved Hospital Problem list   MSSA pneumonia Fungal pneumonia- Candida dublinensis Enterococcal bacteremia-recent blood cultures cleared Concern for possible embolic phenomenon- fingers and possibly CVAs were embolic  Assessment & Plan:   Critically ill due to Acute Hypxemic and AoC hypercapneic respiratory failure;  - likely underly OHS.   Requiring VV ECMO support and mechanical ventilation.  ARDS due to COVID 19 pneumonia, slowly resolving, now likely fibrotic stage disease  Tracheostomy in place -Continue VV ECMO support -Continue to titrate sweep gas down as tolerated. -Discussed this with ECMO specialist at bedside. -Would like to let her breathe and work as much as possible to help maintain her pH. -At nighttime they seem to uptitrate her sleep gas. -Head of bed elevated -Ventilator associated pneumonia prophylaxis -Remains off sedation -Continue routine trach care  Elevated LDH, hypo-fibrinogenemia and thrombocytopenia -LDH stable -Continue to follow labs, observe  ICH, multifocal. L-sided weakness.  Critical illness myopathy--improving slowly. -Continue PT OT SLP -Her weakness will likely be her largest barrier to recovery once able to wean from VV ECMO.  Acute metabolic encephalopathy, improving Sundowning in the evenings -Continue delirium precautions -Encourage appropriate sleep wake cycle -Continue Seroquel.  Acute renal failure, severe uremia.  Anuric. -Continue CVVHD per nephrology -We appreciate their input  Hypotension. Acute cor pulmonale, improved -Remains off vasopressors -Mean arterial pressure goal greater than 65 mmHg -Continue to observe  Hyperglycemia  Titrate insulin  dosage to maintain goal CBG of 1 40-1 80.  Pressure Injury, Skin -Per flowsheet documentation above. -Appreciate wound care.  Best practice:  Diet: tube feeding Pain/Anxiety/Delirium protocol (if indicated): as above VAP protocol (if indicated): yes DVT prophylaxis: SCDs , heparin GI prophylaxis: pantoprazole Glucose control: ssi/basal Mobility: bed rest Code Status: limited Family Communication: Updated patient's daughter at bedside.  [place] via [employer]. Disposition: ICU  This patient is critically ill with multiple organ system failure; which, requires frequent high complexity decision making, assessment, support, evaluation, and titration of therapies. This was completed through the application of advanced monitoring technologies and extensive interpretation of multiple databases. During this encounter critical care time was devoted to patient care services described in this note for 34 minutes.  Garner Nash, DO [facility] 02/25/2020 6:57 AM

## 2020-02-25 NOTE — Procedures (Signed)
Extracorporeal support note  ECLS support day: Cannulated 02/03/2020, Day 41 Indication: COVID ARDS  Configuration: ECMO Mode: VV  Drainage cannula: RIJ [place]  Return cannula: SAME   Pump speed: 4100 rpm  Pump flow: Flow (LPM): 4.96  Pump used: ECMO Device: Centrimag  Oxygenator: Quadrox O2 blender: 100% Sweep gas: 9L  ABG    Component Value Date/Time   PHART 7.394 02/25/2020 0451   PCO2ART 43.2 02/25/2020 0451   PO2ART 57 (L) 02/25/2020 0451   HCO3 26.4 02/25/2020 0451   TCO2 28 02/25/2020 0451   ACIDBASEDEF 1.0 02/23/2020 1826   O2SAT 89.0 02/25/2020 0451    Circuit check: Discussed with ECMO specialist Anticoagulant: Heparin per pharmacy Anticoagulation targets: Anticoagulation Goal: Heap 0.15-0.2  Changes in support: Continue to wean sweep, PT OT SLP, progressive mobility, delirium precautions  Anticipated goals/duration of support: Bridge to recovery  Garner Nash, DO [facility] 02/25/2020 6:57 AM

## 2020-02-25 NOTE — Progress Notes (Signed)
Chatter noted on CentriMag monitor and in cannulas despite running patient even on CRRT, pt is calm without distress, MAP 64-67, RN to give one Albumin.

## 2020-02-25 NOTE — Progress Notes (Signed)
Alisha Stephenson for Heparin Indication: ECMO  Allergies  Allergen Reactions  . Sulfa Antibiotics Rash and Other (See Comments)    Patient Measurements: Height: 5\' 4"  (162.6 cm) Weight: 113.5 kg (250 lb 3.6 oz) IBW/kg (Calculated) : 54.7 Heparin Dosing Weight: 87 kg  Vital Signs: Temp: 98.6 F (37 C) (09/19 0800) Pulse Rate: 91 (09/19 1730)  Labs: Recent Labs    02/23/20 1609 02/23/20 1826 02/24/20 0437 02/24/20 0452 02/24/20 1724 02/24/20 1734 02/24/20 2222 02/24/20 2222 02/25/20 0421 02/25/20 0422 02/25/20 0451 02/25/20 0500 02/25/20 0802 02/25/20 1437 02/25/20 1437 02/25/20 1656 02/25/20 1711  HGB 9.7*   < > 9.3*   < > 9.3*   < > 9.2*   < > 8.9*  --    < >  --    < > 10.2*   < > 9.3* 10.2*  HCT 31.5*   < > 29.6*   < > 30.3*   < > 30.2*   < > 28.9*  --    < >  --    < > 30.0*  --  30.9* 30.0*  PLT 101*   < > 94*   < > 100*   < > 97*  --  100*  --   --   --   --   --   --  99*  --   APTT 71*  --  99*  --  79*  --   --   --   --   --   --   --   --   --   --   --   --   HEPARINUNFRC 0.17*   < > 0.19*   < > 0.19*  --   --   --   --  0.21*  --   --   --   --   --  0.18*  --   CREATININE 1.07*   < > 1.01*   < > 1.09*   < > 1.69*  --   --   --   --  0.99  --   --   --  0.97  --    < > = values in this interval not displayed.    Estimated Creatinine Clearance: 80.9 mL/min (by C-G formula based on SCr of 0.97 mg/dL).  Assessment: 54 yo female COVID+ on VV ECMO. Patient started on bivalirudin but was found to have multiple ICH on 8/13 head CT. Bivalirudin was stopped and pt was off anticoagulation at that time. Her ECMO circuit was changed 8/24. Pharmacy asked to start low-fixed rate heparin. Heparin infusion was held last week due to bleeding from trach site. Heparin was started at low fixed rate for pump patency in setting of ICH. Concern with increased  clotting off crrt as well as circuit clots - s/p circuit exchange 9/17 so will start  to titrate heparin to a low goal of 0.2-0.3  Heparin level 0.18 slightly < goal on heparin 1000 uts/hr systemically and 500 uts/hr running into CRRT circuit.    No overt bleeding or complications noted.  Hgb stable, Pltc low but stable today.  Fibrinogen, LDH fairly stable.  Goal of Therapy:  HL 0.2-0.3 Monitor platelets by anticoagulation protocol: Yes   Plan:  Increase IV heparin 1050 uts/hr  Continue heparin level, CBC q 12 hrs.  Bonnita Nasuti Pharm.D. CPP, BCPS Clinical Pharmacist [phone] 02/25/2020 6:29 PM      [facility] pharmacy phone numbers are listed on [email or web address]

## 2020-02-25 NOTE — Progress Notes (Signed)
[place] for Heparin Indication: ECMO  Allergies  Allergen Reactions  . Sulfa Antibiotics Rash and Other (See Comments)    Patient Measurements: Height: 5\' 4"  (162.6 cm) Weight: 113.5 kg (250 lb 3.6 oz) IBW/kg (Calculated) : 54.7 Heparin Dosing Weight: 87 kg  Vital Signs: Temp: 98.6 F (37 C) (09/19 0800) Temp Source: Oral (09/19 0400) Pulse Rate: 94 (09/19 1200)  Labs: Recent Labs    02/23/20 1609 02/23/20 1826 02/24/20 0437 02/24/20 0452 02/24/20 1724 02/24/20 1734 02/24/20 2222 02/24/20 2222 02/25/20 0421 02/25/20 0421 02/25/20 0422 02/25/20 0451 02/25/20 0500 02/25/20 0802  HGB 9.7*   < > 9.3*   < > 9.3*   < > 9.2*   < > 8.9*   < >  --  9.9*  --  9.9*  HCT 31.5*   < > 29.6*   < > 30.3*   < > 30.2*   < > 28.9*  --   --  29.0*  --  29.0*  PLT 101*   < > 94*   < > 100*  --  97*  --  100*  --   --   --   --   --   APTT 71*  --  99*  --  79*  --   --   --   --   --   --   --   --   --   HEPARINUNFRC 0.17*   < > 0.19*  --  0.19*  --   --   --   --   --  0.21*  --   --   --   CREATININE 1.07*   < > 1.01*   < > 1.09*  --  1.69*  --   --   --   --   --  0.99  --    < > = values in this interval not displayed.    Estimated Creatinine Clearance: 79.3 mL/min (by C-G formula based on SCr of 0.99 mg/dL).  Assessment: 54 yo female COVID+ on VV ECMO. Patient started on bivalirudin but was found to have multiple ICH on 8/13 head CT. Bivalirudin was stopped and pt was off anticoagulation at that time. Her ECMO circuit was changed 8/24. Pharmacy asked to start low-fixed rate heparin. Heparin infusion was held last week due to bleeding from trach site. Heparin was started at low fixed rate for pump patency in setting of ICH. Concern with increased  clotting off crrt as well as circuit clots - s/p circuit exchange 9/17 so will start to titrate heparin to a low goal of 0.2-0.3  Heparin level 0.21, at goal on heparin drip 950 uts/hr.   SHe is  also receiving set rate heparin 500 uts/hr into CRT circuit.   No overt bleeding or complications noted.  Hgb stable, Pltc low but stable today.  Fibrinogen, LDH fairly stable.  Goal of Therapy:  HL 0.2-0.3 Monitor platelets by anticoagulation protocol: Yes   Plan:  Continue IV heparin at current rate. Continue heparin level, CBC q 12 hrs.  [place] Crane, Roylene Reason, BCCP Clinical Pharmacist  02/25/2020 12:32 PM   [facility] pharmacy phone numbers are listed on [email or web address]

## 2020-02-25 NOTE — Progress Notes (Signed)
[facility] Progress Note    Assessment/ Plan:    53F severe MSOF from COVID-19 pneumonia requiring VV ECMO.  1.  Anuric AKI due to shock, sepsis causing acute tubular necrosis:CRRT on 8/22- 02/12/20. IHD 02/13/20.  Planned for 9/9 but back to CRRT d/t fluid overload.  L IJ nontunneled catheter changed 9/9 from L [place].   - Continue CRRT.  On All 4K - Watching K.  Reduced dialysate to 1.5 L/hr from 2 on 9/13. - UF goal net neg 50 mL/ hr as tolerated  - Note renal added flat rate heparin 500 u/ hr to circuit 02/18/20 d/t clotting x 2.  No issues ongoing.    2.Shock due to sepsis, Covid infection, blood cultures positive for Enterococcus bacteremia: s/p Zosyn (ended 9/7), cultures repeated 9/7, NGTD.  TTE 9/7 no AI, done for concern of wide pulse pressure  3.Acute hypoxic and hypercapnic respiratory failure, ARDS due to COVID-19 pneumonia: on VV ECMO for refractory hypoxemia.  3.Hypernatremia, hypervolemia: relative free water deficit. Improved with CRRT.  4.Acute toxic metabolic encephalopathy:Multifactorial, improving  5.Anemia, thrombocytopenia: With hemolysis likely and acute illness contributing. Transfusions per primary team.Plt low stable since adding heparin - cont to monitor.  6.Intracranial hemorrhage: With left-sided weakness, ? Septic emboli  7.  Diabetes per primary   8.  Hypophosphatemia repleting as needed; repletion again on 9/17 am, ok this AM  9.  Critical illness myopathy: PT as able per primary team    Subjective:    Seen and examined on CRRT.   Awake.  Denies pain.  UF1.7L yesterday, was net + 0.2L     Objective:   BP (!) 102/52   Pulse 92   Temp 98.6 F (37 C)   Resp 14   Ht 5' 4" (1.626 m)   Wt 113.5 kg   SpO2 97%   BMI 42.95 kg/m   Intake/Output Summary (Last 24 hours) at 02/25/2020 0841 Last data filed at 02/25/2020 0800 Gross per 24 hour  Intake 2350.48 ml  Output 2119 ml  Net 231.48 ml   Weight change: 0.6  kg  Physical Exam:   Gen: awake, lying in bed in NAD  NECK: R IJ ECMO access CVS: RRR Resp: diminished on auscultation  Abd: obese habitus nontender Ext: trace edema bilaterally ACCESS: L IJ nontunneled HD catheter MSK: necrotic thumb and 1st finger R hand Psych no anxiety or agitation Neuro - alert and oriented - follows commands  Imaging: DG CHEST PORT 1 VIEW  Result Date: 02/25/2020 CLINICAL DATA:  ECMO. EXAM: PORTABLE CHEST 1 VIEW COMPARISON:  02/24/2020 FINDINGS: Tracheostomy tube and left IJ central venous catheter are unchanged. Right-sided PICC line unchanged. ECMO apparatus unchanged. Lungs are somewhat hypoinflated with persistent diffuse bilateral airspace opacification with slightly improved overall aeration bilaterally. Possible small amount of bilateral pleural fluid unchanged. Cardiomediastinal silhouette and remainder of the exam is unchanged. IMPRESSION: 1. Persistent diffuse bilateral airspace process with slightly improved aeration bilaterally. Possible small amount bilateral pleural fluid unchanged. 2. Tubes and lines unchanged. Electronically Signed   By: Marin Olp M.D.   On: 02/25/2020 08:14   DG CHEST PORT 1 VIEW  Result Date: 02/24/2020 CLINICAL DATA:  Patient on ECMO. EXAM: PORTABLE CHEST 1 VIEW COMPARISON:  February 23, 2020 FINDINGS: The left central line, tracheostomy tube, right PICC line, an ECMO device are stable. No pneumothorax. Diffuse bilateral pulmonary infiltrates are more pronounced in the interval. No other changes. IMPRESSION: 1. Support apparatus as above. 2. Diffuse bilateral pulmonary infiltrates  appear worsened in the interval. Electronically Signed   By: Dorise Bullion III M.D   On: 02/24/2020 10:03    Labs: BMET Recent Labs  Lab 02/22/20 0422 02/22/20 6295 02/22/20 1609 02/22/20 1624 02/23/20 0307 02/23/20 0320 02/23/20 1609 02/23/20 1826 02/24/20 2841 02/24/20 3244 02/24/20 1724 02/24/20 1734 02/24/20 2016 02/24/20 2222  02/25/20 0451 02/25/20 0500 02/25/20 0802  NA 136   < > 136   < > 135   < > 133*   < > 135   < > 135 137 138 136 138 136 137  K 4.1   < > 4.4   < > 4.7   < > 4.8   < > 4.7   < > 4.9 4.9 4.8 4.6 4.6 4.5 4.5  CL 102   < > 101  --  101  --  97*  --  101  --  101  --   --  102  --  103  --   CO2 25   < > 25  --  25  --  24  --  24  --  24  --   --  23  --  24  --   GLUCOSE 198*   < > 214*  --  219*  --  263*  --  217*  --  249*  --   --  235*  --  226*  --   BUN 26*   < > 27*  --  30*  --  34*  --  37*  --  38*  --   --  39*  --  37*  --   CREATININE 0.93   < > 0.94  --  1.02*  --  1.07*  --  1.01*  --  1.09*  --   --  1.69*  --  0.99  --   CALCIUM 8.4*   < > 8.7*  --  9.0  --  8.8*  --  9.2  --  9.0  --   --  8.9  --  8.8*  --   PHOS 2.4*  --  3.6  --  2.1*  --  4.6  --  2.9  --  2.7  --   --   --   --  2.4*  --    < > = values in this interval not displayed.   CBC Recent Labs  Lab 02/24/20 0437 02/24/20 0452 02/24/20 1724 02/24/20 1734 02/24/20 2222 02/25/20 0421 02/25/20 0451 02/25/20 0802  WBC 8.5  --  8.4  --  8.0 7.3  --   --   HGB 9.3*   < > 9.3*   < > 9.2* 8.9* 9.9* 9.9*  HCT 29.6*   < > 30.3*   < > 30.2* 28.9* 29.0* 29.0*  MCV 101.4*  --  101.3*  --  101.7* 101.4*  --   --   PLT 94*  --  100*  --  97* 100*  --   --    < > = values in this interval not displayed.    Medications:    . amiodarone  200 mg Per Tube Daily  . vitamin C  500 mg Per Tube Daily  . chlorhexidine gluconate (MEDLINE KIT)  15 mL Mouth Rinse BID  . Chlorhexidine Gluconate Cloth  6 each Topical Q0600  . clonazePAM  1 mg Per Tube Q6H  . collagenase   Topical Daily  . docusate  100 mg Per  Tube BID  . feeding supplement (PROSource TF)  45 mL Per Tube QID  . Gerhardt's butt cream   Topical BID  . insulin aspart  0-15 Units Subcutaneous Q4H  . insulin aspart  7 Units Subcutaneous Q4H  . insulin detemir  32 Units Subcutaneous BID  . linagliptin  5 mg Per Tube Daily  . mouth rinse  15 mL Mouth Rinse 10  times per day  . melatonin  9 mg Per Tube QHS  . metoCLOPramide (REGLAN) injection  5 mg Intravenous Q8H  . midodrine  10 mg Per Tube TID  . multivitamin  1 tablet Per Tube QHS  . oxyCODONE  10 mg Per Tube Q6H  . pantoprazole (PROTONIX) IV  40 mg Intravenous BID  . pneumococcal 23 valent vaccine  0.5 mL Intramuscular Tomorrow-1000  . polyethylene glycol  17 g Per Tube Daily  . QUEtiapine  100 mg Per Tube QHS  . sennosides  5 mL Per Tube BID  . zinc sulfate  220 mg Per Tube Daily     Justin Mend MD 02/25/2020, 8:41 AM

## 2020-02-26 ENCOUNTER — Inpatient Hospital Stay (HOSPITAL_COMMUNITY): Payer: PRIVATE HEALTH INSURANCE

## 2020-02-26 LAB — MAGNESIUM: Magnesium: 2.7 mg/dL — ABNORMAL HIGH (ref 1.7–2.4)

## 2020-02-26 LAB — POCT I-STAT 7, (LYTES, BLD GAS, ICA,H+H)
Acid-Base Excess: 1 mmol/L (ref 0.0–2.0)
Acid-Base Excess: 3 mmol/L — ABNORMAL HIGH (ref 0.0–2.0)
Acid-Base Excess: 0 mmol/L (ref 0.0–2.0)
Acid-Base Excess: 1 mmol/L (ref 0.0–2.0)
Acid-Base Excess: 1 mmol/L (ref 0.0–2.0)
Acid-Base Excess: 1 mmol/L (ref 0.0–2.0)
Bicarbonate: 27.4 mmol/L (ref 20.0–28.0)
Bicarbonate: 29.8 mmol/L — ABNORMAL HIGH (ref 20.0–28.0)
Bicarbonate: 26.7 mmol/L (ref 20.0–28.0)
Bicarbonate: 27.9 mmol/L (ref 20.0–28.0)
Bicarbonate: 27.2 mmol/L (ref 20.0–28.0)
Bicarbonate: 27.6 mmol/L (ref 20.0–28.0)
Calcium, Ion: 1.23 mmol/L (ref 1.15–1.40)
Calcium, Ion: 1.22 mmol/L (ref 1.15–1.40)
Calcium, Ion: 1.24 mmol/L (ref 1.15–1.40)
Calcium, Ion: 1.24 mmol/L (ref 1.15–1.40)
Calcium, Ion: 1.26 mmol/L (ref 1.15–1.40)
Calcium, Ion: 1.22 mmol/L (ref 1.15–1.40)
HCT: 27 % — ABNORMAL LOW (ref 36.0–46.0)
HCT: 28 % — ABNORMAL LOW (ref 36.0–46.0)
HCT: 28 % — ABNORMAL LOW (ref 36.0–46.0)
HCT: 28 % — ABNORMAL LOW (ref 36.0–46.0)
HCT: 26 % — ABNORMAL LOW (ref 36.0–46.0)
HCT: 27 % — ABNORMAL LOW (ref 36.0–46.0)
Hemoglobin: 9.2 g/dL — ABNORMAL LOW (ref 12.0–15.0)
Hemoglobin: 9.5 g/dL — ABNORMAL LOW (ref 12.0–15.0)
Hemoglobin: 9.5 g/dL — ABNORMAL LOW (ref 12.0–15.0)
Hemoglobin: 9.5 g/dL — ABNORMAL LOW (ref 12.0–15.0)
Hemoglobin: 8.8 g/dL — ABNORMAL LOW (ref 12.0–15.0)
Hemoglobin: 9.2 g/dL — ABNORMAL LOW (ref 12.0–15.0)
O2 Saturation: 93 %
O2 Saturation: 93 %
O2 Saturation: 93 %
O2 Saturation: 91 %
O2 Saturation: 91 %
O2 Saturation: 93 %
Patient temperature: 98
Patient temperature: 98
Patient temperature: 97.7
Patient temperature: 97.7
Patient temperature: 97.8
Potassium: 4.5 mmol/L (ref 3.5–5.1)
Potassium: 5.4 mmol/L — ABNORMAL HIGH (ref 3.5–5.1)
Potassium: 4.4 mmol/L (ref 3.5–5.1)
Potassium: 5 mmol/L (ref 3.5–5.1)
Potassium: 4.7 mmol/L (ref 3.5–5.1)
Potassium: 4.7 mmol/L (ref 3.5–5.1)
Sodium: 136 mmol/L (ref 135–145)
Sodium: 137 mmol/L (ref 135–145)
Sodium: 138 mmol/L (ref 135–145)
Sodium: 137 mmol/L (ref 135–145)
Sodium: 135 mmol/L (ref 135–145)
Sodium: 136 mmol/L (ref 135–145)
TCO2: 29 mmol/L (ref 22–32)
TCO2: 31 mmol/L (ref 22–32)
TCO2: 28 mmol/L (ref 22–32)
TCO2: 30 mmol/L (ref 22–32)
TCO2: 29 mmol/L (ref 22–32)
TCO2: 29 mmol/L (ref 22–32)
pCO2 arterial: 52.5 mmHg — ABNORMAL HIGH (ref 32.0–48.0)
pCO2 arterial: 54.4 mmHg — ABNORMAL HIGH (ref 32.0–48.0)
pCO2 arterial: 54.9 mmHg — ABNORMAL HIGH (ref 32.0–48.0)
pCO2 arterial: 57.1 mmHg — ABNORMAL HIGH (ref 32.0–48.0)
pCO2 arterial: 51.6 mmHg — ABNORMAL HIGH (ref 32.0–48.0)
pCO2 arterial: 52.4 mmHg — ABNORMAL HIGH (ref 32.0–48.0)
pH, Arterial: 7.326 — ABNORMAL LOW (ref 7.350–7.450)
pH, Arterial: 7.345 — ABNORMAL LOW (ref 7.350–7.450)
pH, Arterial: 7.294 — ABNORMAL LOW (ref 7.350–7.450)
pH, Arterial: 7.294 — ABNORMAL LOW (ref 7.350–7.450)
pH, Arterial: 7.327 — ABNORMAL LOW (ref 7.350–7.450)
pH, Arterial: 7.328 — ABNORMAL LOW (ref 7.350–7.450)
pO2, Arterial: 74 mmHg — ABNORMAL LOW (ref 83.0–108.0)
pO2, Arterial: 71 mmHg — ABNORMAL LOW (ref 83.0–108.0)
pO2, Arterial: 73 mmHg — ABNORMAL LOW (ref 83.0–108.0)
pO2, Arterial: 67 mmHg — ABNORMAL LOW (ref 83.0–108.0)
pO2, Arterial: 65 mmHg — ABNORMAL LOW (ref 83.0–108.0)
pO2, Arterial: 70 mmHg — ABNORMAL LOW (ref 83.0–108.0)

## 2020-02-26 LAB — BASIC METABOLIC PANEL
Anion gap: 8 (ref 5–15)
BUN: 31 mg/dL — ABNORMAL HIGH (ref 6–20)
CO2: 26 mmol/L (ref 22–32)
Calcium: 9 mg/dL (ref 8.9–10.3)
Chloride: 101 mmol/L (ref 98–111)
Creatinine, Ser: 0.88 mg/dL (ref 0.44–1.00)
GFR calc Af Amer: >60 mL/min (ref >60)
GFR calc non Af Amer: >60 mL/min (ref >60)
Glucose, Bld: 216 mg/dL — ABNORMAL HIGH (ref 70–99)
Potassium: 5 mmol/L (ref 3.5–5.1)
Sodium: 135 mmol/L (ref 135–145)

## 2020-02-26 LAB — GLUCOSE, CAPILLARY
Glucose-Capillary: 267 mg/dL — ABNORMAL HIGH (ref 70–99)
Glucose-Capillary: 278 mg/dL — ABNORMAL HIGH (ref 70–99)
Glucose-Capillary: 259 mg/dL — ABNORMAL HIGH (ref 70–99)
Glucose-Capillary: 211 mg/dL — ABNORMAL HIGH (ref 70–99)
Glucose-Capillary: 232 mg/dL — ABNORMAL HIGH (ref 70–99)
Glucose-Capillary: 177 mg/dL — ABNORMAL HIGH (ref 70–99)

## 2020-02-26 LAB — CBC
HCT: 28.3 % — ABNORMAL LOW (ref 36.0–46.0)
HCT: 29.7 % — ABNORMAL LOW (ref 36.0–46.0)
Hemoglobin: 8.6 g/dL — ABNORMAL LOW (ref 12.0–15.0)
Hemoglobin: 8.7 g/dL — ABNORMAL LOW (ref 12.0–15.0)
MCH: 30.9 pg (ref 26.0–34.0)
MCH: 30.5 pg (ref 26.0–34.0)
MCHC: 30.4 g/dL (ref 30.0–36.0)
MCHC: 29.3 g/dL — ABNORMAL LOW (ref 30.0–36.0)
MCV: 101.8 fL — ABNORMAL HIGH (ref 80.0–100.0)
MCV: 104.2 fL — ABNORMAL HIGH (ref 80.0–100.0)
Platelets: 88 10*3/uL — ABNORMAL LOW (ref 150–400)
Platelets: 91 10*3/uL — ABNORMAL LOW (ref 150–400)
RBC: 2.78 MIL/uL — ABNORMAL LOW (ref 3.87–5.11)
RBC: 2.85 MIL/uL — ABNORMAL LOW (ref 3.87–5.11)
RDW: 22.5 % — ABNORMAL HIGH (ref 11.5–15.5)
RDW: 22.4 % — ABNORMAL HIGH (ref 11.5–15.5)
WBC: 8 10*3/uL (ref 4.0–10.5)
WBC: 8.5 10*3/uL (ref 4.0–10.5)
nRBC: 0.4 % — ABNORMAL HIGH (ref 0.0–0.2)
nRBC: 0.4 % — ABNORMAL HIGH (ref 0.0–0.2)

## 2020-02-26 LAB — RENAL FUNCTION PANEL
Albumin: 3.4 g/dL — ABNORMAL LOW (ref 3.5–5.0)
Anion gap: 10 (ref 5–15)
BUN: 36 mg/dL — ABNORMAL HIGH (ref 6–20)
CO2: 25 mmol/L (ref 22–32)
Calcium: 8.9 mg/dL (ref 8.9–10.3)
Chloride: 100 mmol/L (ref 98–111)
Creatinine, Ser: 0.95 mg/dL (ref 0.44–1.00)
GFR calc Af Amer: >60 mL/min (ref >60)
GFR calc non Af Amer: >60 mL/min (ref >60)
Glucose, Bld: 290 mg/dL — ABNORMAL HIGH (ref 70–99)
Phosphorus: 2.6 mg/dL (ref 2.5–4.6)
Potassium: 4.5 mmol/L (ref 3.5–5.1)
Sodium: 135 mmol/L (ref 135–145)

## 2020-02-26 LAB — LACTIC ACID, PLASMA
Lactic Acid, Venous: 1.1 mmol/L (ref 0.5–1.9)
Lactic Acid, Venous: 1.5 mmol/L (ref 0.5–1.9)

## 2020-02-26 LAB — MISC LABCORP TEST (SEND OUT): Labcorp test code: 716910

## 2020-02-26 LAB — HEPARIN LEVEL (UNFRACTIONATED)
Heparin Unfractionated: 0.29 IU/mL — ABNORMAL LOW (ref 0.30–0.70)
Heparin Unfractionated: 0.29 IU/mL — ABNORMAL LOW (ref 0.30–0.70)

## 2020-02-26 LAB — LACTATE DEHYDROGENASE: LDH: 298 U/L — ABNORMAL HIGH (ref 98–192)

## 2020-02-26 LAB — FIBRINOGEN: Fibrinogen: 540 mg/dL — ABNORMAL HIGH (ref 210–475)

## 2020-02-26 MED ORDER — INSULIN ASPART 100 UNIT/ML ~~LOC~~ SOLN
9.0000 [IU] | SUBCUTANEOUS | Status: DC
  Administered 2020-02-26 – 2020-02-27 (×6): 9 [IU] via SUBCUTANEOUS

## 2020-02-26 MED ORDER — INSULIN DETEMIR 100 UNIT/ML ~~LOC~~ SOLN
38.0000 [IU] | Freq: Two times a day (BID) | SUBCUTANEOUS | Status: DC
  Administered 2020-02-26 – 2020-02-27 (×2): 38 [IU] via SUBCUTANEOUS
  Filled 2020-02-26 (×3): qty 0.38

## 2020-02-26 NOTE — Progress Notes (Signed)
[place] for Heparin Indication: ECMO  Allergies  Allergen Reactions  . Sulfa Antibiotics Rash and Other (See Comments)    Patient Measurements: Height: 5\' 4"  (162.6 cm) Weight: 114 kg (251 lb 5.2 oz) IBW/kg (Calculated) : 54.7 Heparin Dosing Weight: 87 kg  Vital Signs: Temp: 97.7 F (36.5 C) (09/20 0400) Temp Source: Oral (09/20 0400) BP: 98/51 (09/20 0900) Pulse Rate: 89 (09/20 0900)  Labs: Recent Labs    02/23/20 1609 02/23/20 1826 02/24/20 0437 02/24/20 0452 02/24/20 1724 02/24/20 1724 02/24/20 1734 02/25/20 0421 02/25/20 0422 02/25/20 0451 02/25/20 0500 02/25/20 0802 02/25/20 1656 02/25/20 1711 02/26/20 0409 02/26/20 0409 02/26/20 0416 02/26/20 0417 02/26/20 0418 02/26/20 0821  HGB 9.7*   < > 9.3*   < > 9.3*  --    < > 8.9*  --    < >  --    < > 9.3*   < > 9.2*   < > 8.6*  --   --  9.5*  HCT 31.5*   < > 29.6*   < > 30.3*  --    < > 28.9*  --    < >  --    < > 30.9*   < > 27.0*  --  28.3*  --   --  28.0*  PLT 101*   < > 94*   < > 100*  --    < > 100*  --   --   --   --  99*  --   --   --  88*  --   --   --   APTT 71*  --  99*  --  79*  --   --   --   --   --   --   --   --   --   --   --   --   --   --   --   HEPARINUNFRC 0.17*   < > 0.19*   < > 0.19*   < >  --   --  0.21*  --   --   --  0.18*  --   --   --   --   --  0.29*  --   CREATININE 1.07*   < > 1.01*   < > 1.09*   < >   < >  --   --   --  0.99  --  0.97  --   --   --   --  0.95  --   --    < > = values in this interval not displayed.    Estimated Creatinine Clearance: 82.8 mL/min (by C-G formula based on SCr of 0.95 mg/dL).  Assessment: 54 yo female COVID+ on VV ECMO. Patient started on bivalirudin but was found to have multiple ICH on 8/13 head CT. Bivalirudin was stopped and pt was off anticoagulation at that time. Her ECMO circuit was changed 8/24. Pharmacy asked to start low-fixed rate heparin. Heparin infusion was held last week due to bleeding from trach  site. Heparin was started at low fixed rate for pump patency in setting of ICH. Concern with increased  clotting off crrt as well as circuit clots - s/p circuit exchange 9/17 so will start to titrate heparin to a low goal of 0.2-0.3  Heparin level at goal (0.29) this morning, on 1050 units/hr systemically and 500 uts/hr running into CRRT circuit.    No overt bleeding or complications  noted.  Hgb stable, Platelet count dipped back down to 88.  Fibrinogen, LDH fairly stable.  Goal of Therapy:  HL 0.2-0.3 Monitor platelets by anticoagulation protocol: Yes   Plan:  Continue IV heparin 1050 uts/hr  Continue heparin level, CBC q 12 hrs.  [place] Crane, Roylene Reason, BCCP Clinical Pharmacist  02/26/2020 10:19 AM   [facility] pharmacy phone numbers are listed on [email or web address]

## 2020-02-26 NOTE — Progress Notes (Signed)
NAME:  Alisha Stephenson, MRN:  [id number], DOB:  16-Jul-1965, LOS: 44 ADMISSION DATE:  01/11/2020, CONSULTATION DATE:  8/3 REFERRING MD:  Alfredia Ferguson, CHIEF COMPLAINT:  Dyspnea   Brief History   54 y/o female admitted on 8/2 with severe acute respiratory failure with hypoxemia due to COVID 19 pneumonia.  She developed symptoms 1 week prior to admission.  Past Medical History  DM2 Diverticulitis Gallstones Ovarian cyst NAFLD Asthma  Significant Hospital Events   8/2 admit 8/3 ICU transfer, intubated 8/4 prone, paralyze 8/9 significant desaturations today 8/9 VV ECMO cannulation 8/13 ICH 8/30 trach  Consults:  PCCM ECMO team  Procedures:  8/3 ETT > 8/30 8/30 8-0 shiley trach 8/3 PICC >  8/9 LIJ MML 8/9 RIJ [place] 28F  9/8 trialysis 9/10 PEG-J (IR)  Significant Diagnostic Tests:  7/31 CT head > NAICP 7/31 MRI/MRA brain > no acute changes, possibly small aneurysm ACOM 8/13 CT head> multiple areas of ICH  Micro Data:  8/2 blood > NG 8/2 SARS COV 2 > positive 8/4 resp > negative 8/4 urine >  8/12 blood > E. faecalis (pan-sensitive) 8/12 resp: staph aureus> MSSA 8/25 blood>> NG 8/24 BAL> yeast  Antimicrobials:  See fever tab for past abx Current: none  Interim history/subjective:   No major issues overnight.  Patient alert this morning sitting up in chair position.  With coaching able to take larger tidal volumes with respiration.  Objective   Blood pressure (!) 112/52, pulse 92, temperature 97.7 F (36.5 C), temperature source Oral, resp. rate 15, height _0  (1.626 m), weight 114 kg, SpO2 94 %.    Vent Mode: PCV FiO2 (%):  [40 %] 40 % Set Rate:  [15 bmp] 15 bmp PEEP:  [10 cmH20] 10 cmH20 Plateau Pressure:  [27 cmH20] 27 cmH20   Intake/Output Summary (Last 24 hours) at 02/26/2020 0705 Last data filed at 02/26/2020 0700 Gross per 24 hour  Intake 2685.62 ml  Output 1339 ml  Net 1346.62 ml   Filed Weights   02/24/20 0300 02/25/20 0452 02/26/20 0500   Weight: 112.9 kg 113.5 kg 114 kg    Examination:  GEN: Obese elderly female on mechanical life support, VV ECMO in place, right IJ cannulation HEENT: NCAT, tracking appropriately, following commands Neck: Tracheostomy tube in place, right IJ VV ECMO cannulation CV: Regular rate and rhythm, S1-S2 PULM: Bilateral mechanically ventilated breath sounds GI: Soft nontender nondistended obese EXT: Right upper extremity distal dry gangrene, other distal extremities upper and lower both with cyanosis NEURO: Alert, follows commands communicates PSYCH: Calm  Pressure Injury 02/14/20 Anus Medial Deep Tissue Pressure Injury - Purple or maroon localized area of discolored intact skin or blood-filled blister due to damage of underlying soft tissue from pressure and/or shear. (Active)  02/14/20 1300  Location: Anus  Location Orientation: Medial  Staging: Deep Tissue Pressure Injury - Purple or maroon localized area of discolored intact skin or blood-filled blister due to damage of underlying soft tissue from pressure and/or shear.  Wound Description (Comments):   Present on Admission: No     Pressure Injury Back Left;Mid Stage 2 -  Partial thickness loss of dermis presenting as a shallow open injury with a red, pink wound bed without slough. in skin fold on back (Active)     Location: Back  Location Orientation: Left;Mid  Staging: Stage 2 -  Partial thickness loss of dermis presenting as a shallow open injury with a red, pink wound bed without slough.  Wound Description (Comments): in  skin fold on back  Present on Admission: No     Pressure Injury 02/21/20 Sacrum Posterior Unstageable - Full thickness tissue loss in which the base of the injury is covered by slough (yellow, tan, gray, green or brown) and/or eschar (tan, brown or black) in the wound bed. Unstageable Sacral Wound (Active)  02/21/20 1206  Location: Sacrum  Location Orientation: Posterior  Staging: Unstageable - Full thickness  tissue loss in which the base of the injury is covered by slough (yellow, tan, gray, green or brown) and/or eschar (tan, brown or black) in the wound bed.  Wound Description (Comments): Unstageable Sacral Wound  Present on Admission: No   Labs reviewed with ECMO team at bedside  CXR: 02/26/2020 Persistent bilateral infiltrates stable lines and tubes. The patient's images have been independently reviewed by me.       Resolved Hospital Problem list   MSSA pneumonia Fungal pneumonia- Candida dublinensis Enterococcal bacteremia-recent blood cultures cleared Concern for possible embolic phenomenon- fingers and possibly CVAs were embolic  Assessment & Plan:   Critically ill due to Acute Hypxemic and AoC hypercapneic respiratory failure;  - likely underly OHS.   Requiring VV ECMO support and mechanical ventilation.  ARDS due to COVID 19 pneumonia, slowly resolving, now likely fibrotic stage disease  Tracheostomy in place -Continue VV ECMO support -Continue to titrate sleep as tolerated to maintain appropriate pH. Discussed with ECMO specialist at bedside. -Head of bed elevated -Ventilator associated pneumonia prophylaxis -Remain off continuous sedation -Continue routine trach care.  Elevated LDH, hypo-fibrinogenemia and thrombocytopenia -Stable labs -Continue to follow  ICH, multifocal. L-sided weakness.  Critical illness myopathy--improving slowly. -Continue PT OT SLP -Would like to see her get up at side of bed again..  Acute metabolic encephalopathy, improving Sundowning in the evenings -Continue delirium precautions -Encourage appropriate sleep-wake cycle -Continue Seroquel  Acute renal failure, severe uremia.  Anuric. -Continue CVVHD per nephrology  Hypotension. Acute cor pulmonale, improved -Remains off vasopressors at this time. -Goal mean arterial pressure than 65 mils mercury  Hyperglycemia  -Continue insulin to maintain CBG goal of 140-180  Pressure Injury,  Skin -Per documented flowsheet as above -Continue wound care  Best practice:  Diet: tube feeding Pain/Anxiety/Delirium protocol (if indicated): as above VAP protocol (if indicated): yes DVT prophylaxis: SCDs , heparin GI prophylaxis: pantoprazole Glucose control: ssi/basal Mobility: bed rest Code Status: limited Family Communication: team to update family  Disposition: ICU  This patient is critically ill with multiple organ system failure; which, requires frequent high complexity decision making, assessment, support, evaluation, and titration of therapies. This was completed through the application of advanced monitoring technologies and extensive interpretation of multiple databases. During this encounter critical care time was devoted to patient care services described in this note for 33 minutes.  Garner Nash, DO [facility] 02/26/2020 7:06 AM

## 2020-02-26 NOTE — Progress Notes (Signed)
Physical Therapy Treatment Patient Details Name: Alisha Stephenson MRN: [id number] DOB: [birth year] Today's Date: 02/26/2020    History of Present Illness Pt is 54 yo admitted 8/2 with hypoxemia due to Covid 19 PNA. 8/3 intubated. 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 CRRT 8/22-9/6, restarted 9/9. Reintubated 8/24 with ECMO circuit changed, 8/30 trach. 9/10 PEG tube. 9/17 circuit change. PMHx: DM, diverticulitis, Asthma    PT Comments    Pt awake, alert, and stating desire to stand with lack of realization of lines/equipment. Pt able to sit EOB 9 min with improved trunk control and SpO2 mid 90s throughout. Pt with increasing strength bil LE exhibiting 2-/5 dorsiflexion bil and knee extension. Decreased tolerance for right knee flexion today. Continued improvement in RUE strength and movement with pt reaching for support of RUE throughout EOB. Encouraged continued HEP with spouse and tilting with RN . Pt fatigued end of session and resting with wedge under left hip with bil PRAFOs on.  Vent 40% FiO2 on pressure control BP 102/37 (58) at end of EOB   Follow Up Recommendations  LTACH;Supervision/Assistance - 24 hour     Equipment Recommendations  Other (comment) (TBD with medical progression)    Recommendations for Other Services       Precautions / Restrictions Precautions Precaution Comments: ECMO with RIJ access anchors at right shoulder and chest, right necrotic thumb and index finger, bruised/necrotic left hand, trach, vent, CRRT, Gtube to suction, Jtube for nutrition Other Brace: B PRAFO    Mobility  Bed Mobility Overal bed mobility: Needs Assistance Bed Mobility: Supine to Sit;Sit to Supine     Supine to sit: +2 for physical assistance;Total assist Sit to supine: +2 for physical assistance;Total assist   General bed mobility comments: helicopter pivot from supine to and from sit with total +2 physical assist with RN and EcMO  specialist to maintain lines. Total +3 to slide toward HOB.  Transfers                    Ambulation/Gait                 Stairs             Wheelchair Mobility    Modified Rankin (Stroke Patients Only) Modified Rankin (Stroke Patients Only) Pre-Morbid Rankin Score: No symptoms Modified Rankin: Severe disability     Balance Overall balance assessment: Needs assistance Sitting-balance support: Bilateral upper extremity supported Sitting balance-Leahy Scale: Poor Sitting balance - Comments: pt EOB for 9 min with min-mod assist, cues for increased right and anterior lean to achieve midline with pt using bil UE to support trunk. Significantly improved trunk control from prior EOB trials                                    Cognition Arousal/Alertness: Awake/alert Behavior During Therapy: WFL for tasks assessed/performed Overall Cognitive Status: Impaired/Different from baseline Area of Impairment: Safety/judgement                       Following Commands: Follows one step commands with increased time;Follows one step commands inconsistently Safety/Judgement: Decreased awareness of deficits;Decreased awareness of safety     General Comments: pt continues to state desire to stand and get off her butt and receive inhaler with lack of realization for all lines and purpose of ECMO  Exercises General Exercises - Lower Extremity Short Arc QuadSinclair Ship;Both;Seated;10 reps Heel Slides: AAROM;Both;10 reps;Seated Toe Raises: AAROM;Both;10 reps;Seated Heel Raises: AAROM;Both;10 reps;Seated    General Comments        Pertinent Vitals/Pain Pain Score: 7  Pain Location: sacrum Pain Descriptors / Indicators: Grimacing;Sore Pain Intervention(s): Limited activity within patient's tolerance;Monitored during session;Premedicated before session;Repositioned    Home Living                      Prior Function            PT  Goals (current goals can now be found in the care plan section) Progress towards PT goals: Progressing toward goals    Frequency    Min 3X/week      PT Plan Current plan remains appropriate    Co-evaluation              AM-PAC PT "6 Clicks" Mobility   Outcome Measure  Help needed turning from your back to your side while in a flat bed without using bedrails?: Total Help needed moving from lying on your back to sitting on the side of a flat bed without using bedrails?: Total Help needed moving to and from a bed to a chair (including a wheelchair)?: Total Help needed standing up from a chair using your arms (e.g., wheelchair or bedside chair)?: Total Help needed to walk in hospital room?: Total Help needed climbing 3-5 steps with a railing? : Total 6 Click Score: 6    End of Session   Activity Tolerance: Patient tolerated treatment well Patient left: in bed;with family/visitor present;Other (comment);with SCD's reapplied (bil PRAfO) Nurse Communication: Mobility status PT Visit Diagnosis: Other abnormalities of gait and mobility (R26.89);Muscle weakness (generalized) (M62.81)     Time: 9373-4287 PT Time Calculation (min) (ACUTE ONLY): 33 min  Charges:  $Therapeutic Exercise: 8-22 mins $Therapeutic Activity: 8-22 mins                     Amery Vandenbos P, PT Acute Rehabilitation Services Pager: [phone] Office: [phone]    Sandy Salaam Sabrinia Prien 02/26/2020, 12:56 PM

## 2020-02-26 NOTE — Progress Notes (Signed)
[place] for Heparin Indication: ECMO  Allergies  Allergen Reactions  . Sulfa Antibiotics Rash and Other (See Comments)    Patient Measurements: Height: 5\' 4"  (162.6 cm) Weight: 114 kg (251 lb 5.2 oz) IBW/kg (Calculated) : 54.7 Heparin Dosing Weight: 87 kg  Vital Signs: Temp: 97.7 F (36.5 C) (09/20 1600) Temp Source: Oral (09/20 1600) BP: 83/55 (09/20 1700) Pulse Rate: 93 (09/20 1700)  Labs: Recent Labs    02/24/20 0437 02/24/20 0452 02/24/20 1724 02/24/20 1734 02/25/20 1656 02/25/20 1711 02/26/20 0416 02/26/20 0417 02/26/20 0418 02/26/20 0821 02/26/20 1000 02/26/20 1000 02/26/20 1622 02/26/20 1648  HGB 9.3*   < > 9.3*   < > 9.3*   < > 8.6*  --   --    < > 9.5*   < > 9.5* 8.7*  HCT 29.6*   < > 30.3*   < > 30.9*   < > 28.3*  --   --    < > 28.0*  --  28.0* 29.7*  PLT 94*   < > 100*   < > 99*  --  88*  --   --   --   --   --   --  91*  APTT 99*  --  79*  --   --   --   --   --   --   --   --   --   --   --   HEPARINUNFRC 0.19*   < > 0.19*   < > 0.18*  --   --   --  0.29*  --   --   --   --  0.29*  CREATININE 1.01*   < > 1.09*   < > 0.97  --   --  0.95  --   --   --   --   --  0.88   < > = values in this interval not displayed.    Estimated Creatinine Clearance: 89.4 mL/min (by C-G formula based on SCr of 0.88 mg/dL).  Assessment: 54 yo female COVID+ on VV ECMO. Patient started on bivalirudin but was found to have multiple ICH on 8/13 head CT. Bivalirudin was stopped and pt was off anticoagulation at that time. Her ECMO circuit was changed 8/24. Pharmacy asked to start low-fixed rate heparin. Heparin infusion was held last week due to bleeding from trach site.  Heparin was started at low fixed rate for pump patency in setting of ICH. Concern with increased  clotting off crrt as well as circuit clots - s/p circuit exchange 9/17 so will start to titrate heparin to a low goal of 0.2-0.3  Heparin level this evening remains  therapeutic (HL 0.29, goal of 0.2-0.3). The systemic heparin is running at 1050 units/hr with a fixed rate of 500 units/hr running into CRRT circuit.    No complications noted per discussion with RN though some blood noted with suctioning - will monitor. Hgb 8.7, plts 91.  Goal of Therapy:  HL 0.2-0.3 Monitor platelets by anticoagulation protocol: Yes   Plan:  Continue IV heparin 1050 uts/hr  Continue heparin level, CBC q 12 hrs.  Thank you for allowing pharmacy to be a part of this patient's care.  Alycia Rossetti, PharmD, BCPS Clinical Pharmacist Clinical phone for 02/26/2020: [phone] 02/26/2020 5:48 PM   **Pharmacist phone directory can now be found on [email or web address] ([email or web address]).  Listed under [place].

## 2020-02-26 NOTE — Progress Notes (Signed)
Patient ID: Alisha Stephenson, female   DOB: [birth year], 54 y.o.   MRN: [id number] S: Not able to UF overnight due to low BP's.  Now UF rate of 50 ml/hr O:BP (!) 108/46   Pulse 87   Temp (!) 93.6 F (34.2 C) (Rectal)   Resp (!) 28   Ht 5' 4"  (1.626 m)   Wt 114 kg   SpO2 98%   BMI 43.14 kg/m   Intake/Output Summary (Last 24 hours) at 02/26/2020 1055 Last data filed at 02/26/2020 1000 Gross per 24 hour  Intake 2417.07 ml  Output 1462 ml  Net 955.07 ml   Intake/Output: I/O last 3 completed shifts: In: 4056.6 [I.V.:616.6; NG/GT:2505; IV Piggyback:935] Out: 1938 [Drains:450; HXTAV:6979]  Intake/Output this shift:  Total I/O In: 226.4 [I.V.:31.4; NG/GT:195] Out: 343 [Other:343] Weight change: 0.5 kg YIA:[id number] mentation but responsive, critically ill-appearing CVS: RRR Resp: mechanical BS bilaterally Abd: obese, +BS, soft Ext: no edema, RUE dry gangrene of thumb-3rd finger  Recent Labs  Lab 02/23/20 0307 02/23/20 0320 02/23/20 1609 02/23/20 1826 02/24/20 0437 02/24/20 0452 02/24/20 1724 02/24/20 1734 02/24/20 2222 02/25/20 0451 02/25/20 0500 02/25/20 0802 02/25/20 1656 02/25/20 1711 02/25/20 2026 02/26/20 0409 02/26/20 0417 02/26/20 0821 02/26/20 1000  NA 135   < > 133*   < > 135   < > 135   < > 136   < > 136   < > 136 137 141 136 135 137 138  K 4.7   < > 4.8   < > 4.7   < > 4.9   < > 4.6   < > 4.5   < > 4.8 4.9 4.2 4.5 4.5 5.4* 4.4  CL 101   < > 97*  --  101  --  101  --  102  --  103  --  101  --   --   --  100  --   --   CO2 25   < > 24  --  24  --  24  --  23  --  24  --  25  --   --   --  25  --   --   GLUCOSE 219*   < > 263*  --  217*  --  249*  --  235*  --  226*  --  237*  --   --   --  290*  --   --   BUN 30*   < > 34*  --  37*  --  38*  --  39*  --  37*  --  35*  --   --   --  36*  --   --   CREATININE 1.02*   < > 1.07*  --  1.01*  --  1.09*  --  1.69*  --  0.99  --  0.97  --   --   --  0.95  --   --   ALBUMIN 3.3*  --  3.5  --  3.2*  --  3.2*  --   --   --   3.2*  --  3.4*  --   --   --  3.4*  --   --   CALCIUM 9.0   < > 8.8*  --  9.2  --  9.0  --  8.9  --  8.8*  --  8.9  --   --   --  8.9  --   --  PHOS 2.1*  --  4.6  --  2.9  --  2.7  --   --   --  2.4*  --  3.0  --   --   --  2.6  --   --    < > = values in this interval not displayed.   Liver Function Tests: Recent Labs  Lab 02/25/20 0500 02/25/20 1656 02/26/20 0417  ALBUMIN 3.2* 3.4* 3.4*   No results for input(s): LIPASE, AMYLASE in the last 168 hours. No results for input(s): AMMONIA in the last 168 hours. CBC: Recent Labs  Lab 02/24/20 1724 02/24/20 1734 02/24/20 2222 02/24/20 2222 02/25/20 0421 02/25/20 0451 02/25/20 1656 02/25/20 1711 02/26/20 0416 02/26/20 0821 02/26/20 1000  WBC 8.4   < > 8.0   < > 7.3  --  9.0  --  8.0  --   --   HGB 9.3*   < > 9.2*   < > 8.9*   < > 9.3*   < > 8.6* 9.5* 9.5*  HCT 30.3*   < > 30.2*   < > 28.9*   < > 30.9*   < > 28.3* 28.0* 28.0*  MCV 101.3*  --  101.7*  --  101.4*  --  102.3*  --  101.8*  --   --   PLT 100*   < > 97*   < > 100*  --  99*  --  88*  --   --    < > = values in this interval not displayed.   Cardiac Enzymes: No results for input(s): CKTOTAL, CKMB, CKMBINDEX, TROPONINI in the last 168 hours. CBG: Recent Labs  Lab 02/25/20 1708 02/25/20 2003 02/25/20 2352 02/26/20 0408 02/26/20 0818  GLUCAP 244* 138* 192* 267* 278*    Iron Studies: No results for input(s): IRON, TIBC, TRANSFERRIN, FERRITIN in the last 72 hours. Studies/Results: DG CHEST PORT 1 VIEW  Result Date: 02/26/2020 CLINICAL DATA:  ECMO catheter placement EXAM: PORTABLE CHEST 1 VIEW COMPARISON:  02/25/2020 FINDINGS: Tracheostomy, left internal jugular temporary hemodialysis catheter with its tip overlying the superior vena cava, right upper extremity PICC line with its tip within the terminal subclavian vein, and right internal jugular ECMO catheter with markers overlying the superior vena cava, superior cavoatrial junction, and inferior cavoatrial  junction are again identified. Lung volumes are small, but are symmetric and are stable since prior examination. Superimposed extensive bilateral airspace infiltrate appears stable. No pneumothorax or pleural effusion. Cardiac size within normal limits. IMPRESSION: Support lines and tubes as described above, stable from prior examination. Advancement of the ECMO catheter by roughly 3 cm to better position the middle marker over the right atrium could be considered. Stable extensive bilateral pulmonary infiltrates. Electronically Signed   By: Fidela Salisbury MD   On: 02/26/2020 06:15   DG CHEST PORT 1 VIEW  Result Date: 02/25/2020 CLINICAL DATA:  ECMO. EXAM: PORTABLE CHEST 1 VIEW COMPARISON:  02/24/2020 FINDINGS: Tracheostomy tube and left IJ central venous catheter are unchanged. Right-sided PICC line unchanged. ECMO apparatus unchanged. Lungs are somewhat hypoinflated with persistent diffuse bilateral airspace opacification with slightly improved overall aeration bilaterally. Possible small amount of bilateral pleural fluid unchanged. Cardiomediastinal silhouette and remainder of the exam is unchanged. IMPRESSION: 1. Persistent diffuse bilateral airspace process with slightly improved aeration bilaterally. Possible small amount bilateral pleural fluid unchanged. 2. Tubes and lines unchanged. Electronically Signed   By: Marin Olp M.D.   On: 02/25/2020 08:14   . amiodarone  200  mg Per Tube Daily  . vitamin C  500 mg Per Tube Daily  . chlorhexidine gluconate (MEDLINE KIT)  15 mL Mouth Rinse BID  . Chlorhexidine Gluconate Cloth  6 each Topical Q0600  . clonazePAM  1 mg Per Tube Q6H  . collagenase   Topical Daily  . docusate  100 mg Per Tube BID  . feeding supplement (PROSource TF)  45 mL Per Tube QID  . Gerhardt's butt cream   Topical BID  . insulin aspart  0-15 Units Subcutaneous Q4H  . insulin aspart  7 Units Subcutaneous Q4H  . insulin detemir  32 Units Subcutaneous BID  . linagliptin  5 mg Per  Tube Daily  . mouth rinse  15 mL Mouth Rinse 10 times per day  . melatonin  9 mg Per Tube QHS  . metoCLOPramide (REGLAN) injection  5 mg Intravenous Q8H  . midodrine  10 mg Per Tube TID  . multivitamin  1 tablet Per Tube QHS  . oxyCODONE  10 mg Per Tube Q6H  . pantoprazole (PROTONIX) IV  40 mg Intravenous BID  . pneumococcal 23 valent vaccine  0.5 mL Intramuscular Tomorrow-1000  . polyethylene glycol  17 g Per Tube Daily  . QUEtiapine  100 mg Per Tube QHS  . sennosides  5 mL Per Tube BID  . zinc sulfate  220 mg Per Tube Daily    BMET    Component Value Date/Time   NA 138 02/26/2020 1000   K 4.4 02/26/2020 1000   CL 100 02/26/2020 0417   CO2 25 02/26/2020 0417   GLUCOSE 290 (H) 02/26/2020 0417   BUN 36 (H) 02/26/2020 0417   CREATININE 0.95 02/26/2020 0417   CALCIUM 8.9 02/26/2020 0417   GFRNONAA >60 02/26/2020 0417   GFRAA >60 02/26/2020 0417   CBC    Component Value Date/Time   WBC 8.0 02/26/2020 0416   RBC 2.78 (L) 02/26/2020 0416   HGB 9.5 (L) 02/26/2020 1000   HCT 28.0 (L) 02/26/2020 1000   PLT 88 (L) 02/26/2020 0416   MCV 101.8 (H) 02/26/2020 0416   MCH 30.9 02/26/2020 0416   MCHC 30.4 02/26/2020 0416   RDW 22.5 (H) 02/26/2020 0416   LYMPHSABS 1.7 02/07/2020 0311   MONOABS 0.7 02/07/2020 0311   EOSABS 0.8 (H) 02/07/2020 0311   BASOSABS 0.1 02/07/2020 0311     Assessment/Plan:  1. Anuric AKI in setting of ischemic ATN related to septic shock from COVD-19 PNA requiring VV ECMO.  Started on CRRT 8/22-02/12/20 and transitioned to IHD on 02/13/20 but back to CRRT on 02/15/20 due to volume overload and large IVF intake.  Tolerating CRRT and tolerating UF. 2. Vascular access- L IJ trialysis catheter placed 02/15/20. 3. Hypophosphatemia- due to CRRT- repleted and cont to follow 4. Shock due to COVID-19 infection diagnosed 01/28/2020 as well as enterococcal bacteremia- completed zosyn 02/13/20, negative TTE.  Off of pressors. 5. Acute hypoxic/hypercapneic respiratory failure in  setting of severe covid PNA/ARDS- now on VV ECMO per PCCM/CHF.  Lung function slowly returning.  UF as bp tolerates. 6. ICH- stable by CT scan 8/14.  Likely will have long-term deficits. 7. Thrombocytopenia- stable 8. Morbid obesity 9. DM type 2 10. P. Atrial fibrillation  Donetta Potts, MD [facility] [phone]

## 2020-02-26 NOTE — Procedures (Signed)
Extracorporeal support note  ECLS support day: Cannulated 02/05/2020, Day 42 Indication: COVID ARDS  Configuration: ECMO Mode: VV  Drainage cannula: RIJ [place]  Return cannula: Same   Pump speed: 4200 rpm  Pump flow: Flow (LPM): 5.10  Pump used: ECMO Device: Centrimag  Oxygenator: Quadrox O2 blender: 100% Sweep gas: 7L  ABG    Component Value Date/Time   PHART 7.326 (L) 02/26/2020 0409   PCO2ART 52.5 (H) 02/26/2020 0409   PO2ART 74 (L) 02/26/2020 0409   HCO3 27.4 02/26/2020 0409   TCO2 29 02/26/2020 0409   ACIDBASEDEF 2.0 02/25/2020 2026   O2SAT 93.0 02/26/2020 0409    Circuit check: Discussed with ECMO specialist at bedside  Anticoagulant: Heparin per pharmacy  Anticoagulation targets: Anticoagulation Goal: Hep 0.2-0.3  Changes in support: Weaning sweep, maintain slight acidotic pH 7.35 goal, aggressive PT/OT, progressive mobility, delirium precautions   Anticipated goals/duration of support: Bridge to recovery   Discussed with multidisciplinary team rounds.  Garner Nash, DO [facility] 02/26/2020 7:07 AM

## 2020-02-26 NOTE — Progress Notes (Signed)
Patient ID: Alisha Stephenson, female   DOB: 03-31-1966, 54 y.o.   MRN: [id number]    Advanced Heart Failure Rounding Note   Subjective:    - 8/2 COVID + test - 8/9 Cannulated for VV ECMO - 8/13 with several areas of intracranial hemorrhage. Bival stopped.  - 8/14 CT no change in [place]. Increased edema - 8/16 Extubated - 8/16 Head CT stable bleed - 8/22 CVVHD started - 8/24 reintubated - 8/24 circuit changed - 8/30 trach - 9/7  Switched to iHD - 02/15/20 Switched back to CVVHD - 9/10 GJ tube placed - 9/17 Circuit change  Remains awake on vent via trach. Communicative. Following commands but very weak.   Remains on CVVHD. Pulling -25 to -50. Received 2 albumins for chugging.   Off pressors.   CXR slightly improved today  ECMO  Flow 5.1 L RPM 4100 Sweep7  Labs: 7.33/53/74/93% 40% FiO2 TV 180 cc -> up to 800 with deep breaths on PS Hgb8.3 PLT 93k LDH 315 -> 374 -> 383 -> 331 -> 356 -> 305 -> 291-> 298 Lactic acid1.6 -> 2.0 -> 1.8 -> 1.3 -> 1.1 Heparin 0.21  Objective:   Weight Range:  Vital Signs:   Temp:  [97.5 F (36.4 C)-98.6 F (37 C)] 97.7 F (36.5 C) (09/20 0400) Pulse Rate:  [86-104] 92 (09/20 0700) Resp:  [15-31] 15 (09/20 0400) BP: (92-152)/(34-75) 112/52 (09/20 0700) SpO2:  [89 %-100 %] 94 % (09/20 0700) Arterial Line BP: (73-133)/(35-99) 85/75 (09/20 0700) FiO2 (%):  [40 %] 40 % (09/20 0400) Weight:  [458 kg] 114 kg (09/20 0500) Last BM Date: 02/25/20  Weight change: Filed Weights   02/24/20 0300 02/25/20 0452 02/26/20 0500  Weight: 112.9 kg 113.5 kg 114 kg    Intake/Output:   Intake/Output Summary (Last 24 hours) at 02/26/2020 0747 Last data filed at 02/26/2020 0700 Gross per 24 hour  Intake 2685.62 ml  Output 1339 ml  Net 1346.62 ml     Physical Exam: General:  Sitting up in bed awake following commands HEENT: normal Neck: supple. RIJ ECMO . Carotids 2+ bilat; no bruits. No lymphadenopathy or thryomegaly appreciated. Cor: PMI  nondisplaced. Regular rate & rhythm. No rubs, gallops or murmurs. Lungs: shallow breaths Abdomen: obese soft, nontender, nondistended. No hepatosplenomegaly. No bruits or masses. Good bowel sounds. Extremities: no cyanosis, clubbing, rash, tr edema Neuro: alert following commands. weak   Telemetry: Sinus 90-100 Personally reviewed   Labs: Basic Metabolic Panel: Recent Labs  Lab 02/22/20 0422 02/22/20 0436 02/23/20 0307 02/23/20 0320 02/24/20 0437 02/24/20 0452 02/24/20 1724 02/24/20 1734 02/24/20 2222 02/25/20 0451 02/25/20 0500 02/25/20 0802 02/25/20 1656 02/25/20 1711 02/25/20 2026 02/26/20 0409 02/26/20 0417  NA 136   < > 135   < > 135   < > 135   < > 136   < > 136   < > 136 137 141 136 135  K 4.1   < > 4.7   < > 4.7   < > 4.9   < > 4.6   < > 4.5   < > 4.8 4.9 4.2 4.5 4.5  CL 102   < > 101   < > 101   < > 101  --  102  --  103  --  101  --   --   --  100  CO2 25   < > 25   < > 24   < > 24  --  23  --  24  --  25  --   --   --  25  GLUCOSE 198*   < > 219*   < > 217*   < > 249*  --  235*  --  226*  --  237*  --   --   --  290*  BUN 26*   < > 30*   < > 37*   < > 38*  --  39*  --  37*  --  35*  --   --   --  36*  CREATININE 0.93   < > 1.02*   < > 1.01*   < > 1.09*  --  1.69*  --  0.99  --  0.97  --   --   --  0.95  CALCIUM 8.4*   < > 9.0   < > 9.2   < > 9.0   < > 8.9  --  8.8*  --  8.9  --   --   --  8.9  MG 2.7*  --  3.0*  --  3.0*  --   --   --   --   --  2.8*  --   --   --   --   --  2.7*  PHOS 2.4*   < > 2.1*   < > 2.9  --  2.7  --   --   --  2.4*  --  3.0  --   --   --  2.6   < > = values in this interval not displayed.    Liver Function Tests: Recent Labs  Lab 02/24/20 0437 02/24/20 1724 02/25/20 0500 02/25/20 1656 02/26/20 0417  ALBUMIN 3.2* 3.2* 3.2* 3.4* 3.4*   No results for input(s): LIPASE, AMYLASE in the last 168 hours. No results for input(s): AMMONIA in the last 168 hours.  CBC: Recent Labs  Lab 02/24/20 1724 02/24/20 1734 02/24/20 2222  02/24/20 2222 02/25/20 0421 02/25/20 0451 02/25/20 1656 02/25/20 1711 02/25/20 2026 02/26/20 0409 02/26/20 0416  WBC 8.4  --  8.0  --  7.3  --  9.0  --   --   --  8.0  HGB 9.3*   < > 9.2*   < > 8.9*   < > 9.3* 10.2* 8.8* 9.2* 8.6*  HCT 30.3*   < > 30.2*   < > 28.9*   < > 30.9* 30.0* 26.0* 27.0* 28.3*  MCV 101.3*  --  101.7*  --  101.4*  --  102.3*  --   --   --  101.8*  PLT 100*  --  97*  --  100*  --  99*  --   --   --  88*   < > = values in this interval not displayed.    Cardiac Enzymes: No results for input(s): CKTOTAL, CKMB, CKMBINDEX, TROPONINI in the last 168 hours.  BNP: BNP (last 3 results) No results for input(s): BNP in the last 8760 hours.  ProBNP (last 3 results) No results for input(s): PROBNP in the last 8760 hours.    Other results:  Imaging: DG CHEST PORT 1 VIEW  Result Date: 02/26/2020 CLINICAL DATA:  ECMO catheter placement EXAM: PORTABLE CHEST 1 VIEW COMPARISON:  02/25/2020 FINDINGS: Tracheostomy, left internal jugular temporary hemodialysis catheter with its tip overlying the superior vena cava, right upper extremity PICC line with its tip within the terminal subclavian vein, and right internal jugular ECMO catheter with markers overlying the superior vena  cava, superior cavoatrial junction, and inferior cavoatrial junction are again identified. Lung volumes are small, but are symmetric and are stable since prior examination. Superimposed extensive bilateral airspace infiltrate appears stable. No pneumothorax or pleural effusion. Cardiac size within normal limits. IMPRESSION: Support lines and tubes as described above, stable from prior examination. Advancement of the ECMO catheter by roughly 3 cm to better position the middle marker over the right atrium could be considered. Stable extensive bilateral pulmonary infiltrates. Electronically Signed   By: Fidela Salisbury MD   On: 02/26/2020 06:15   DG CHEST PORT 1 VIEW  Result Date: 02/25/2020 CLINICAL DATA:   ECMO. EXAM: PORTABLE CHEST 1 VIEW COMPARISON:  02/24/2020 FINDINGS: Tracheostomy tube and left IJ central venous catheter are unchanged. Right-sided PICC line unchanged. ECMO apparatus unchanged. Lungs are somewhat hypoinflated with persistent diffuse bilateral airspace opacification with slightly improved overall aeration bilaterally. Possible small amount of bilateral pleural fluid unchanged. Cardiomediastinal silhouette and remainder of the exam is unchanged. IMPRESSION: 1. Persistent diffuse bilateral airspace process with slightly improved aeration bilaterally. Possible small amount bilateral pleural fluid unchanged. 2. Tubes and lines unchanged. Electronically Signed   By: Marin Olp M.D.   On: 02/25/2020 08:14     Medications:     Scheduled Medications: . amiodarone  200 mg Per Tube Daily  . vitamin C  500 mg Per Tube Daily  . chlorhexidine gluconate (MEDLINE KIT)  15 mL Mouth Rinse BID  . Chlorhexidine Gluconate Cloth  6 each Topical Q0600  . clonazePAM  1 mg Per Tube Q6H  . collagenase   Topical Daily  . docusate  100 mg Per Tube BID  . feeding supplement (PROSource TF)  45 mL Per Tube QID  . Gerhardt's butt cream   Topical BID  . insulin aspart  0-15 Units Subcutaneous Q4H  . insulin aspart  7 Units Subcutaneous Q4H  . insulin detemir  32 Units Subcutaneous BID  . linagliptin  5 mg Per Tube Daily  . mouth rinse  15 mL Mouth Rinse 10 times per day  . melatonin  9 mg Per Tube QHS  . metoCLOPramide (REGLAN) injection  5 mg Intravenous Q8H  . midodrine  10 mg Per Tube TID  . multivitamin  1 tablet Per Tube QHS  . oxyCODONE  10 mg Per Tube Q6H  . pantoprazole (PROTONIX) IV  40 mg Intravenous BID  . pneumococcal 23 valent vaccine  0.5 mL Intramuscular Tomorrow-1000  . polyethylene glycol  17 g Per Tube Daily  . QUEtiapine  100 mg Per Tube QHS  . sennosides  5 mL Per Tube BID  . zinc sulfate  220 mg Per Tube Daily    Infusions: .  prismasol BGK 4/2.5 400 mL/hr at 02/26/20  0306  .  prismasol BGK 4/2.5 200 mL/hr at 02/25/20 1445  . sodium chloride 500 mL (02/11/20 2215)  . sodium chloride Stopped (02/25/20 0739)  . sodium chloride Stopped (02/24/20 2312)  . sodium chloride    . albumin human 12.5 g (02/26/20 0318)  . albumin human Stopped (02/06/20 2000)  . feeding supplement (PIVOT 1.5 CAL) 1,000 mL (02/25/20 2123)  . heparin 10,000 units/ 20 mL infusion syringe 500 Units/hr (02/25/20 1713)  . heparin 1,050 Units/hr (02/26/20 0700)  . prismasol BGK 4/2.5 1,500 mL/hr at 02/26/20 0629    PRN Medications: sodium chloride, sodium chloride, sodium chloride, sodium chloride, acetaminophen (TYLENOL) oral liquid 160 mg/5 mL, albumin human, albuterol, alteplase, alum & mag hydroxide-simeth, dextrose, fentaNYL (SUBLIMAZE) injection,  guaiFENesin-dextromethorphan, heparin, heparin, heparin, hydrALAZINE, HYDROmorphone (DILAUDID) injection, hydrOXYzine, lidocaine (PF), lidocaine-prilocaine, ondansetron **OR** ondansetron (ZOFRAN) IV, pentafluoroprop-tetrafluoroeth   Assessment/Plan:   1. Acute hypoxic/hypercapneic respiratory failure in setting of severe COVID PNA/ARDS -> VV ECMO - admit 8/2 - intubation 8/3 - has received actmera (compelted 8/2), remdesivir (completed 8/6) and steroids - Cannulated for VV ECMO on 8/9 - Extubated 8/16. Reintubated 8/24 - s/p trach 8/30. - Circuit changed 8/24 due to concern for hemolysis and worsening oxygenation. Repeat circuit change 9/17 - CVVHD started 8/22 for volume removal and uremia. Remains anuric. CXR worse on 9/9 so switched back to CVVHD. Now keeping at -50/hr. CXRwas improving. Now worse today. Unclear if related to circuit change.  - Off IV sedation. Continue melatonin and seroquel for sleep at night. Adjusted to help with restlessness and sundowning at night - Stable off pressors. On midodrine 10 tid. Has wide pulse pressure but no AI on echo.  - Lung function returning very slowly. I suspect major issue is respiratory  muscle weakness and poor lung compliance. Continue to wean sweep. Now down to 7.  Suspect baseline pCO2 is in 50s. Ok to let it drift up a bit. Continue to keep her mildly acidotic and encourage respiratory muscle training - Continue heparin with level 0.2-0.3 Discussed dosing with PharmD personally.  2. Enterococcus sepsis - Finished high-dose zosyn on 9/7 per ID - Eraxis added on 8/26 with yeast in BAL 8/24 (ended 9/2)  - Cultures repeated with increasing pressor demands - F/u cx 9/7 Remain NGTD.  - No change  3. Intracranial hemorrhage - ? Septic emboli - repeat head CT on 8/14 with stable bleeds but increased edema - neurology has seen. Suspect significant long-term injury sustained. Will follow commands. Appears to have dense LUE weakness and possibly LLE - repeat head CT stable 8/16 - tolerating heparin. No change  4. Thrombocytopenia - PLTs 93k   - Continue to follow.  - Stable   5. Morbid obesity - Body mass index is Body mass index is 43.14 kg/m.  6. Poorly controlled DM2 - HgBA1c 10.7 - IV insulin converted to SQ insulin on 9/15  7. PAF - intermittent episodes. Last 8/26 - Now on po. Quiescent  8. AKI/azotemia - CVVHD started on 8/22 - Down 70 pounds.  - transitioned to [facility] on 9/7 - switched back to CVVHD for better volume removal on 9/9. - new catheter placed 9/9 - will eventually need tunneled access - Keep UF rate -25 to - 50 as above. Weight stable.   9. Emesis - Has GJ tube now, tolerating TFs  10. Sacral decub  - Wound care following. Watch for infection   11. Necrotic fingers - VVS following - suspect will auto amputate  12. Anemia - Transfuse hgb < 8   CRITICAL CARE Performed by: Glori Bickers  Total critical care time: 35 minutes  Critical care time was exclusive of separately billable procedures and treating other patients.  Critical care was necessary to treat or prevent imminent or life-threatening deterioration.  Critical  care was time spent personally by me (independent of midlevel providers or residents) on the following activities: development of treatment plan with patient and/or surrogate as well as nursing, discussions with consultants, evaluation of patient's response to treatment, examination of patient, obtaining history from patient or surrogate, ordering and performing treatments and interventions, ordering and review of laboratory studies, ordering and review of radiographic studies, pulse oximetry and re-evaluation of patient's condition.   Length of Stay: 87  Glori Bickers  MD 02/26/2020, 7:47 AM  Advanced Heart Failure Team Pager [phone] (M-F; 7a - 4p)  Please contact [facility] for night-coverage after hours (4p -7a ) and weekends on [email or web address]

## 2020-02-26 NOTE — Progress Notes (Addendum)
Inpatient Diabetes Program Recommendations  AACE/ADA: New Consensus Statement on Inpatient Glycemic Control (2015)  Target Ranges:  Prepandial:   less than 140 mg/dL      Peak postprandial:   less than 180 mg/dL (1-2 hours)      Critically ill patients:  140 - 180 mg/dL   Lab Results  Component Value Date   GLUCAP 259 (H) 02/26/2020   HGBA1C 10.7 (H) 01/14/2020    Review of Glycemic Control Results for [name], [name] (MRN [id number]) as of 02/26/2020 11:44  Ref. Range 02/25/2020 20:03 02/25/2020 23:52 02/26/2020 04:08 02/26/2020 08:18 02/26/2020 11:28  Glucose-Capillary Latest Ref Range: 70 - 99 mg/dL 138 (H) 192 (H) 267 (H) 278 (H) 259 (H)   Diabetes history: DM 2 Current orders for Inpatient glycemic control:  Levemir 32 units bid, Novolog 7 units q 4 hours  Inpatient Diabetes Program Recommendations:    Please consider increasing Levemir to 38 units bid and increase Novolog tube feed coverage to 9 units q 4 hours.   Thanks,  Adah Perl, RN, BC-ADM Inpatient Diabetes Coordinator Pager [phone] (8a-5p)

## 2020-02-27 ENCOUNTER — Inpatient Hospital Stay (HOSPITAL_COMMUNITY): Payer: PRIVATE HEALTH INSURANCE

## 2020-02-27 LAB — LACTIC ACID, PLASMA
Lactic Acid, Venous: 1.4 mmol/L (ref 0.5–1.9)
Lactic Acid, Venous: 1.5 mmol/L (ref 0.5–1.9)

## 2020-02-27 LAB — POCT I-STAT 7, (LYTES, BLD GAS, ICA,H+H)
Acid-Base Excess: 1 mmol/L (ref 0.0–2.0)
Acid-Base Excess: 1 mmol/L (ref 0.0–2.0)
Acid-Base Excess: 0 mmol/L (ref 0.0–2.0)
Acid-Base Excess: 0 mmol/L (ref 0.0–2.0)
Acid-Base Excess: 1 mmol/L (ref 0.0–2.0)
Acid-Base Excess: 1 mmol/L (ref 0.0–2.0)
Bicarbonate: 27.2 mmol/L (ref 20.0–28.0)
Bicarbonate: 27.4 mmol/L (ref 20.0–28.0)
Bicarbonate: 26.8 mmol/L (ref 20.0–28.0)
Bicarbonate: 27.3 mmol/L (ref 20.0–28.0)
Bicarbonate: 27.5 mmol/L (ref 20.0–28.0)
Bicarbonate: 27.3 mmol/L (ref 20.0–28.0)
Calcium, Ion: 1.21 mmol/L (ref 1.15–1.40)
Calcium, Ion: 1.22 mmol/L (ref 1.15–1.40)
Calcium, Ion: 1.21 mmol/L (ref 1.15–1.40)
Calcium, Ion: 1.22 mmol/L (ref 1.15–1.40)
Calcium, Ion: 1.22 mmol/L (ref 1.15–1.40)
Calcium, Ion: 1.22 mmol/L (ref 1.15–1.40)
HCT: 25 % — ABNORMAL LOW (ref 36.0–46.0)
HCT: 27 % — ABNORMAL LOW (ref 36.0–46.0)
HCT: 29 % — ABNORMAL LOW (ref 36.0–46.0)
HCT: 28 % — ABNORMAL LOW (ref 36.0–46.0)
HCT: 27 % — ABNORMAL LOW (ref 36.0–46.0)
HCT: 25 % — ABNORMAL LOW (ref 36.0–46.0)
Hemoglobin: 8.5 g/dL — ABNORMAL LOW (ref 12.0–15.0)
Hemoglobin: 9.2 g/dL — ABNORMAL LOW (ref 12.0–15.0)
Hemoglobin: 9.9 g/dL — ABNORMAL LOW (ref 12.0–15.0)
Hemoglobin: 9.5 g/dL — ABNORMAL LOW (ref 12.0–15.0)
Hemoglobin: 9.2 g/dL — ABNORMAL LOW (ref 12.0–15.0)
Hemoglobin: 8.5 g/dL — ABNORMAL LOW (ref 12.0–15.0)
O2 Saturation: 98 %
O2 Saturation: 92 %
O2 Saturation: 94 %
O2 Saturation: 92 %
O2 Saturation: 95 %
O2 Saturation: 98 %
Patient temperature: 97.7
Patient temperature: 97.9
Patient temperature: 97.9
Patient temperature: 98
Patient temperature: 97.7
Patient temperature: 98.8
Potassium: 4.8 mmol/L (ref 3.5–5.1)
Potassium: 5.1 mmol/L (ref 3.5–5.1)
Potassium: 5 mmol/L (ref 3.5–5.1)
Potassium: 5.2 mmol/L — ABNORMAL HIGH (ref 3.5–5.1)
Potassium: 4.9 mmol/L (ref 3.5–5.1)
Potassium: 4.7 mmol/L (ref 3.5–5.1)
Sodium: 135 mmol/L (ref 135–145)
Sodium: 135 mmol/L (ref 135–145)
Sodium: 135 mmol/L (ref 135–145)
Sodium: 135 mmol/L (ref 135–145)
Sodium: 135 mmol/L (ref 135–145)
Sodium: 135 mmol/L (ref 135–145)
TCO2: 29 mmol/L (ref 22–32)
TCO2: 29 mmol/L (ref 22–32)
TCO2: 28 mmol/L (ref 22–32)
TCO2: 29 mmol/L (ref 22–32)
TCO2: 29 mmol/L (ref 22–32)
TCO2: 29 mmol/L (ref 22–32)
pCO2 arterial: 47.1 mmHg (ref 32.0–48.0)
pCO2 arterial: 53.3 mmHg — ABNORMAL HIGH (ref 32.0–48.0)
pCO2 arterial: 53.2 mmHg — ABNORMAL HIGH (ref 32.0–48.0)
pCO2 arterial: 53.6 mmHg — ABNORMAL HIGH (ref 32.0–48.0)
pCO2 arterial: 52.5 mmHg — ABNORMAL HIGH (ref 32.0–48.0)
pCO2 arterial: 53.2 mmHg — ABNORMAL HIGH (ref 32.0–48.0)
pH, Arterial: 7.368 (ref 7.350–7.450)
pH, Arterial: 7.317 — ABNORMAL LOW (ref 7.350–7.450)
pH, Arterial: 7.308 — ABNORMAL LOW (ref 7.350–7.450)
pH, Arterial: 7.313 — ABNORMAL LOW (ref 7.350–7.450)
pH, Arterial: 7.325 — ABNORMAL LOW (ref 7.350–7.450)
pH, Arterial: 7.319 — ABNORMAL LOW (ref 7.350–7.450)
pO2, Arterial: 100 mmHg (ref 83.0–108.0)
pO2, Arterial: 68 mmHg — ABNORMAL LOW (ref 83.0–108.0)
pO2, Arterial: 77 mmHg — ABNORMAL LOW (ref 83.0–108.0)
pO2, Arterial: 69 mmHg — ABNORMAL LOW (ref 83.0–108.0)
pO2, Arterial: 83 mmHg (ref 83.0–108.0)
pO2, Arterial: 109 mmHg — ABNORMAL HIGH (ref 83.0–108.0)

## 2020-02-27 LAB — RENAL FUNCTION PANEL
Albumin: 3.2 g/dL — ABNORMAL LOW (ref 3.5–5.0)
Albumin: 3.2 g/dL — ABNORMAL LOW (ref 3.5–5.0)
Anion gap: 8 (ref 5–15)
Anion gap: 12 (ref 5–15)
BUN: 34 mg/dL — ABNORMAL HIGH (ref 6–20)
BUN: 37 mg/dL — ABNORMAL HIGH (ref 6–20)
CO2: 25 mmol/L (ref 22–32)
CO2: 24 mmol/L (ref 22–32)
Calcium: 8.8 mg/dL — ABNORMAL LOW (ref 8.9–10.3)
Calcium: 8.7 mg/dL — ABNORMAL LOW (ref 8.9–10.3)
Chloride: 101 mmol/L (ref 98–111)
Chloride: 98 mmol/L (ref 98–111)
Creatinine, Ser: 1.01 mg/dL — ABNORMAL HIGH (ref 0.44–1.00)
Creatinine, Ser: 1.02 mg/dL — ABNORMAL HIGH (ref 0.44–1.00)
GFR calc Af Amer: >60 mL/min (ref >60)
GFR calc Af Amer: >60 mL/min (ref >60)
GFR calc non Af Amer: >60 mL/min (ref >60)
GFR calc non Af Amer: >60 mL/min (ref >60)
Glucose, Bld: 208 mg/dL — ABNORMAL HIGH (ref 70–99)
Glucose, Bld: 271 mg/dL — ABNORMAL HIGH (ref 70–99)
Phosphorus: 1.9 mg/dL — ABNORMAL LOW (ref 2.5–4.6)
Phosphorus: 3.5 mg/dL (ref 2.5–4.6)
Potassium: 4.8 mmol/L (ref 3.5–5.1)
Potassium: 5.1 mmol/L (ref 3.5–5.1)
Sodium: 134 mmol/L — ABNORMAL LOW (ref 135–145)
Sodium: 134 mmol/L — ABNORMAL LOW (ref 135–145)

## 2020-02-27 LAB — GLUCOSE, CAPILLARY
Glucose-Capillary: 193 mg/dL — ABNORMAL HIGH (ref 70–99)
Glucose-Capillary: 262 mg/dL — ABNORMAL HIGH (ref 70–99)
Glucose-Capillary: 285 mg/dL — ABNORMAL HIGH (ref 70–99)
Glucose-Capillary: 252 mg/dL — ABNORMAL HIGH (ref 70–99)
Glucose-Capillary: 232 mg/dL — ABNORMAL HIGH (ref 70–99)
Glucose-Capillary: 203 mg/dL — ABNORMAL HIGH (ref 70–99)

## 2020-02-27 LAB — CBC
HCT: 26.6 % — ABNORMAL LOW (ref 36.0–46.0)
Hemoglobin: 8.1 g/dL — ABNORMAL LOW (ref 12.0–15.0)
MCH: 31.3 pg (ref 26.0–34.0)
MCHC: 30.5 g/dL (ref 30.0–36.0)
MCV: 102.7 fL — ABNORMAL HIGH (ref 80.0–100.0)
Platelets: 89 10*3/uL — ABNORMAL LOW (ref 150–400)
RBC: 2.59 MIL/uL — ABNORMAL LOW (ref 3.87–5.11)
RDW: 22.4 % — ABNORMAL HIGH (ref 11.5–15.5)
WBC: 6.7 10*3/uL (ref 4.0–10.5)
nRBC: 0.6 % — ABNORMAL HIGH (ref 0.0–0.2)

## 2020-02-27 LAB — HEPARIN LEVEL (UNFRACTIONATED)
Heparin Unfractionated: 0.16 IU/mL — ABNORMAL LOW (ref 0.30–0.70)
Heparin Unfractionated: 0.16 IU/mL — ABNORMAL LOW (ref 0.30–0.70)

## 2020-02-27 LAB — MAGNESIUM: Magnesium: 2.7 mg/dL — ABNORMAL HIGH (ref 1.7–2.4)

## 2020-02-27 LAB — LACTATE DEHYDROGENASE: LDH: 276 U/L — ABNORMAL HIGH (ref 98–192)

## 2020-02-27 LAB — FIBRINOGEN: Fibrinogen: 669 mg/dL — ABNORMAL HIGH (ref 210–475)

## 2020-02-27 MED ORDER — SODIUM PHOSPHATES 45 MMOLE/15ML IV SOLN
20.0000 mmol | Freq: Once | INTRAVENOUS | Status: AC
  Administered 2020-02-27: 20 mmol via INTRAVENOUS
  Filled 2020-02-27: qty 6.67

## 2020-02-27 MED ORDER — ZINC OXIDE 12.8 % EX OINT
TOPICAL_OINTMENT | CUTANEOUS | Status: DC | PRN
  Administered 2020-02-29: 1 via TOPICAL
  Filled 2020-02-27: qty 56.7

## 2020-02-27 MED ORDER — INSULIN DETEMIR 100 UNIT/ML ~~LOC~~ SOLN
42.0000 [IU] | Freq: Two times a day (BID) | SUBCUTANEOUS | Status: DC
  Administered 2020-02-27: 42 [IU] via SUBCUTANEOUS
  Filled 2020-02-27 (×3): qty 0.42

## 2020-02-27 MED ORDER — INSULIN ASPART 100 UNIT/ML ~~LOC~~ SOLN
10.0000 [IU] | SUBCUTANEOUS | Status: DC
  Administered 2020-02-27 – 2020-02-28 (×4): 10 [IU] via SUBCUTANEOUS

## 2020-02-27 NOTE — Consult Note (Addendum)
[place] Nurse wound follow up Patient receiving care in [place]. Primary RN present at time of assessment, as well as a member of the medical staff. Wound type: Necrotic, unstageable sacral PI with dark grey wound bed, in need of debridement.  We discussed possibility of hydrotherapy, but feel the patient is too unstable to tolerate prolonged positioning required for hydrotherapy.  This wound measures 8 cm x 7 cm and has foul odor. The left laterial midback body fold wound measures 2 cm x 3 cm is 100% yellow. I recommend surgical debridement of both.  The medical provider agrees to reach out to surgical services to request this for her. The rectal wound related to the former use of the Flexiseal appears stable.  I have ordered triple paste for this area. Measurement: Wound bed: Drainage (amount, consistency, odor)  Periwound: Dressing procedure/placement/frequency: Continue the enzymatic debridement as ordered. Val Riles, RN, MSN, CWOCN, CNS-BC, pager [phone]

## 2020-02-27 NOTE — Progress Notes (Signed)
Patient ID: Alisha Stephenson, female   DOB: 05/31/1966, 54 y.o.   MRN: [id number] S: NO issues overnight.   O:BP (!) 106/56 (BP Location: Left Arm)    Pulse (!) 109    Temp 97.7 F (36.5 C) (Oral)    Resp (!) 28    Ht 5' 4"  (1.626 m)    Wt 115.1 kg    SpO2 91%    BMI 43.56 kg/m   Intake/Output Summary (Last 24 hours) at 02/27/2020 1034 Last data filed at 02/27/2020 1000 Gross per 24 hour  Intake 1706.48 ml  Output 2798 ml  Net -1091.52 ml   Intake/Output: I/O last 3 completed shifts: In: 3437.6 [I.V.:397.6; Other:150; NG/GT:2450; IV Piggyback:440] Out: 2897 [Drains:400; UKGUR:4270]  Intake/Output this shift:  Total I/O In: 21 [I.V.:21] Out: 299 [Other:299] Weight change: 1.1 kg Gen: obese female on vent via trach CVS: tachy at 109 Resp: mechanical BS bilaterally Abd: +BS, soft, Nt/ND Ext: no edema, RUE gangrene of thumb and forefinger.  Recent Labs  Lab 02/23/20 1609 02/23/20 1826 02/24/20 0437 02/24/20 0452 02/24/20 1724 02/24/20 1734 02/24/20 2222 02/25/20 0451 02/25/20 0500 02/25/20 0802 02/25/20 1656 02/25/20 1711 02/26/20 0417 02/26/20 0821 02/26/20 1622 02/26/20 1648 02/26/20 1808 02/26/20 2001 02/27/20 0403 02/27/20 0418 02/27/20 0949  NA 133*   < > 135   < > 135   < > 136   < > 136   < > 136   < > 135   < > 137 135 135 136 134* 135 135  K 4.8   < > 4.7   < > 4.9   < > 4.6   < > 4.5   < > 4.8   < > 4.5   < > 5.0 5.0 4.7 4.7 4.8 4.8 5.1  CL 97*   < > 101   < > 101  --  102  --  103  --  101  --  100  --   --  101  --   --  101  --   --   CO2 24   < > 24   < > 24  --  23  --  24  --  25  --  25  --   --  26  --   --  25  --   --   GLUCOSE 263*   < > 217*   < > 249*  --  235*  --  226*  --  237*  --  290*  --   --  216*  --   --  208*  --   --   BUN 34*   < > 37*   < > 38*  --  39*  --  37*  --  35*  --  36*  --   --  31*  --   --  34*  --   --   CREATININE 1.07*   < > 1.01*   < > 1.09*  --  1.69*  --  0.99  --  0.97  --  0.95  --   --  0.88  --   --  1.01*  --    --   ALBUMIN 3.5  --  3.2*  --  3.2*  --   --   --  3.2*  --  3.4*  --  3.4*  --   --   --   --   --  3.2*  --   --  CALCIUM 8.8*   < > 9.2   < > 9.0  --  8.9  --  8.8*  --  8.9  --  8.9  --   --  9.0  --   --  8.8*  --   --   PHOS 4.6  --  2.9  --  2.7  --   --   --  2.4*  --  3.0  --  2.6  --   --   --   --   --  1.9*  --   --    < > = values in this interval not displayed.   Liver Function Tests: Recent Labs  Lab 02/25/20 1656 02/26/20 0417 02/27/20 0403  ALBUMIN 3.4* 3.4* 3.2*   No results for input(s): LIPASE, AMYLASE in the last 168 hours. No results for input(s): AMMONIA in the last 168 hours. CBC: Recent Labs  Lab 02/25/20 0421 02/25/20 0451 02/25/20 1656 02/25/20 1711 02/26/20 0416 02/26/20 0821 02/26/20 1648 02/26/20 1808 02/27/20 0403 02/27/20 0418 02/27/20 0949  WBC 7.3   < > 9.0   < > 8.0  --  8.5  --  6.7  --   --   HGB 8.9*   < > 9.3*   < > 8.6*   < > 8.7*   < > 8.1* 8.5* 9.2*  HCT 28.9*   < > 30.9*   < > 28.3*   < > 29.7*   < > 26.6* 25.0* 27.0*  MCV 101.4*  --  102.3*  --  101.8*  --  104.2*  --  102.7*  --   --   PLT 100*   < > 99*   < > 88*  --  91*  --  89*  --   --    < > = values in this interval not displayed.   Cardiac Enzymes: No results for input(s): CKTOTAL, CKMB, CKMBINDEX, TROPONINI in the last 168 hours. CBG: Recent Labs  Lab 02/26/20 1620 02/26/20 1958 02/26/20 2323 02/27/20 0415 02/27/20 0801  GLUCAP 211* 232* 177* 193* 262*    Iron Studies: No results for input(s): IRON, TIBC, TRANSFERRIN, FERRITIN in the last 72 hours. Studies/Results: DG CHEST PORT 1 VIEW  Result Date: 02/27/2020 CLINICAL DATA:  Tracheostomy.  ECMO. EXAM: PORTABLE CHEST 1 VIEW COMPARISON:  02/26/2020. FINDINGS: Tracheostomy tube, left IJ line, right PICC line in stable position. ECMO device in unchanged position. Heart size stable. Diffuse severe bilateral pulmonary infiltrates again noted without interim change. Small right pleural effusion cannot be  excluded. No pneumothorax. Prior cervical spine fusion. IMPRESSION: 1.  Lines and tubes in unchanged position. 2. Diffuse severe bilateral pulmonary infiltrates again noted without interim change. Small right pleural effusion cannot be excluded. Electronically Signed   By: Marcello Moores  Register   On: 02/27/2020 06:57   DG CHEST PORT 1 VIEW  Result Date: 02/26/2020 CLINICAL DATA:  ECMO catheter placement EXAM: PORTABLE CHEST 1 VIEW COMPARISON:  02/25/2020 FINDINGS: Tracheostomy, left internal jugular temporary hemodialysis catheter with its tip overlying the superior vena cava, right upper extremity PICC line with its tip within the terminal subclavian vein, and right internal jugular ECMO catheter with markers overlying the superior vena cava, superior cavoatrial junction, and inferior cavoatrial junction are again identified. Lung volumes are small, but are symmetric and are stable since prior examination. Superimposed extensive bilateral airspace infiltrate appears stable. No pneumothorax or pleural effusion. Cardiac size within normal limits. IMPRESSION: Support lines and tubes as described above,  stable from prior examination. Advancement of the ECMO catheter by roughly 3 cm to better position the middle marker over the right atrium could be considered. Stable extensive bilateral pulmonary infiltrates. Electronically Signed   By: Fidela Salisbury MD   On: 02/26/2020 06:15    amiodarone  200 mg Per Tube Daily   vitamin C  500 mg Per Tube Daily   chlorhexidine gluconate (MEDLINE KIT)  15 mL Mouth Rinse BID   Chlorhexidine Gluconate Cloth  6 each Topical Q0600   clonazePAM  1 mg Per Tube Q6H   collagenase   Topical Daily   docusate  100 mg Per Tube BID   feeding supplement (PROSource TF)  45 mL Per Tube QID   Gerhardt's butt cream   Topical BID   insulin aspart  0-15 Units Subcutaneous Q4H   insulin aspart  9 Units Subcutaneous Q4H   insulin detemir  38 Units Subcutaneous BID   linagliptin   5 mg Per Tube Daily   mouth rinse  15 mL Mouth Rinse 10 times per day   melatonin  9 mg Per Tube QHS   metoCLOPramide (REGLAN) injection  5 mg Intravenous Q8H   midodrine  10 mg Per Tube TID   multivitamin  1 tablet Per Tube QHS   oxyCODONE  10 mg Per Tube Q6H   pantoprazole (PROTONIX) IV  40 mg Intravenous BID   pneumococcal 23 valent vaccine  0.5 mL Intramuscular Tomorrow-1000   polyethylene glycol  17 g Per Tube Daily   QUEtiapine  100 mg Per Tube QHS   sennosides  5 mL Per Tube BID   zinc sulfate  220 mg Per Tube Daily    BMET    Component Value Date/Time   NA 135 02/27/2020 0949   K 5.1 02/27/2020 0949   CL 101 02/27/2020 0403   CO2 25 02/27/2020 0403   GLUCOSE 208 (H) 02/27/2020 0403   BUN 34 (H) 02/27/2020 0403   CREATININE 1.01 (H) 02/27/2020 0403   CALCIUM 8.8 (L) 02/27/2020 0403   GFRNONAA >60 02/27/2020 0403   GFRAA >60 02/27/2020 0403   CBC    Component Value Date/Time   WBC 6.7 02/27/2020 0403   RBC 2.59 (L) 02/27/2020 0403   HGB 9.2 (L) 02/27/2020 0949   HCT 27.0 (L) 02/27/2020 0949   PLT 89 (L) 02/27/2020 0403   MCV 102.7 (H) 02/27/2020 0403   MCH 31.3 02/27/2020 0403   MCHC 30.5 02/27/2020 0403   RDW 22.4 (H) 02/27/2020 0403   LYMPHSABS 1.7 02/07/2020 0311   MONOABS 0.7 02/07/2020 0311   EOSABS 0.8 (H) 02/07/2020 0311   BASOSABS 0.1 02/07/2020 0311    Assessment/Plan:  1. Anuric AKI in setting of ischemic ATN related to septic shock from COVD-19 PNA requiring VV ECMO.  Started on CRRT 8/22-02/12/20 and transitioned to IHD on 02/13/20 but back to CRRT on 02/15/20 due to volume overload and large IVF intake.  Tolerating CRRT and tolerating UF. 2. Vascular access- L IJ trialysis catheter placed 02/15/20.  Will need new access this week.  Will discuss with PCCM 3. Hypophosphatemia- due to CRRT- will re-order replacement phos and follow. 4. Shock due to COVID-19 infection diagnosed 01/07/2020 as well as enterococcal bacteremia- completed zosyn 02/13/20,  negative TTE.  Off of pressors. 5. Acute hypoxic/hypercapneic respiratory failure in setting of severe covid PNA/ARDS- now on VV ECMO per PCCM/CHF.  Lung function slowly returning.  UF as bp tolerates. 6. ICH- stable by CT scan 8/14.  Likely will have long-term deficits. 7. Thrombocytopenia- stable 8. Morbid obesity 9. DM type 2 10. P. Atrial fibrillation  Donetta Potts, MD [facility] [phone]

## 2020-02-27 NOTE — Progress Notes (Signed)
NAME:  Alisha Stephenson, MRN:  [id number], DOB:  07-15-1965, LOS: 39 ADMISSION DATE:  01/13/2020, CONSULTATION DATE:  8/3 REFERRING MD:  Alfredia Ferguson, CHIEF COMPLAINT:  Dyspnea   Brief History   54 y/o female admitted on 8/2 with severe acute respiratory failure with hypoxemia due to COVID 19 pneumonia.  She developed symptoms 1 week prior to admission.  Past Medical History  DM2 Diverticulitis Gallstones Ovarian cyst NAFLD Asthma  Significant Hospital Events   8/2 admit 8/3 ICU transfer, intubated 8/4 prone, paralyze 8/9 significant desaturations today 8/9 VV ECMO cannulation 8/13 ICH 8/30 trach  Consults:  PCCM ECMO team General surgery 02/27/2020 for sacral wound care possible debridement  Procedures:  8/3 ETT > 8/30 8/30 8-0 shiley trach 8/3 PICC >  8/9 LIJ MML 8/9 RIJ [place] 1F  9/8 trialysis 9/10 PEG-J (IR)  Significant Diagnostic Tests:  7/31 CT head > NAICP 7/31 MRI/MRA brain > no acute changes, possibly small aneurysm ACOM 8/13 CT head> multiple areas of ICH  Micro Data:  8/2 blood > NG 8/2 SARS COV 2 > positive 8/4 resp > negative 8/4 urine >  8/12 blood > E. faecalis (pan-sensitive) 8/12 resp: staph aureus> MSSA 8/25 blood>> NG 8/24 BAL> yeast  Antimicrobials:  See fever tab for past abx Current: none  Interim history/subjective:   No issues overnight. Case discussed with general surgery today as well as wound care nursing. Afebrile  Objective   Blood pressure (!) 96/48, pulse (!) 106, temperature 97.7 F (36.5 C), temperature source Oral, resp. rate (!) 28, height _0  (1.626 m), weight 115.1 kg, SpO2 91 %. CVP:  [7 mmHg] 7 mmHg  Vent Mode: PCV FiO2 (%):  [40 %] 40 % Set Rate:  [15 bmp] 15 bmp PEEP:  [10 cmH20] 10 cmH20 Plateau Pressure:  [31 cmH20] 31 cmH20   Intake/Output Summary (Last 24 hours) at 02/27/2020 9622 Last data filed at 02/27/2020 0800 Gross per 24 hour  Intake 1761 ml  Output 2720 ml  Net -959 ml   Filed Weights    02/25/20 0452 02/26/20 0500 02/27/20 0500  Weight: 113.5 kg 114 kg 115.1 kg    Examination:  GEN: Obese female on mechanical life support, VV ECMO in place, right IJ cannulation HEENT: NCAT, tracking appropriately, following commands Neck: Tracheostomy tube in place, CDI CV: Regular rate rhythm, S1-S2 PULM: Bilateral mechanically ventilated breath sounds GI: Soft nontender nondistended obese pannus EXT: Right upper extremity distal gangrene of the thumb and forefinger NEURO: Alert oriented following commands PSYCH: Calm  Pressure Injury 02/14/20 Anus Medial Deep Tissue Pressure Injury - Purple or maroon localized area of discolored intact skin or blood-filled blister due to damage of underlying soft tissue from pressure and/or shear. (Active)  02/14/20 1300  Location: Anus  Location Orientation: Medial  Staging: Deep Tissue Pressure Injury - Purple or maroon localized area of discolored intact skin or blood-filled blister due to damage of underlying soft tissue from pressure and/or shear.  Wound Description (Comments):   Present on Admission: No     Pressure Injury Back Left;Mid Stage 2 -  Partial thickness loss of dermis presenting as a shallow open injury with a red, pink wound bed without slough. in skin fold on back (Active)     Location: Back  Location Orientation: Left;Mid  Staging: Stage 2 -  Partial thickness loss of dermis presenting as a shallow open injury with a red, pink wound bed without slough.  Wound Description (Comments): in skin fold on back  Present on Admission: No     Pressure Injury 02/21/20 Sacrum Posterior Unstageable - Full thickness tissue loss in which the base of the injury is covered by slough (yellow, tan, gray, green or brown) and/or eschar (tan, brown or black) in the wound bed. Unstageable Sacral Wound (Active)  02/21/20 1206  Location: Sacrum  Location Orientation: Posterior  Staging: Unstageable - Full thickness tissue loss in which the base of  the injury is covered by slough (yellow, tan, gray, green or brown) and/or eschar (tan, brown or black) in the wound bed.  Wound Description (Comments): Unstageable Sacral Wound  Present on Admission: No   Labs reviewed with ECMO team at bedside on multidisciplinary rounds  Chest x-ray: 02/27/2020: Bilateral persistent infiltrates, stable lines and tubes The patient's images have been independently reviewed by me.       Resolved Hospital Problem list   MSSA pneumonia Fungal pneumonia- Candida dublinensis Enterococcal bacteremia-recent blood cultures cleared Concern for possible embolic phenomenon- fingers and possibly CVAs were embolic  Assessment & Plan:   Critically ill due to Acute Hypxemic and AoC hypercapneic respiratory failure;  - likely underly OHS.   Requiring VV ECMO support and mechanical ventilation.  ARDS due to COVID 19 pneumonia, slowly resolving, now likely fibrotic stage disease  Tracheostomy in place -Continue VV ECMO support -Continue to titrate down sweep gas as tolerated -Discussed circuit with ECMO specialist at bedside -Continue head of bed elevated, ventilator associated pneumonia prophylaxis -Remains off of continuous sedation -Routine trach care. -Would like to trial patient today for short-term and pressure support. When she is coached she is able to pull tidal volumes as high as 600 cc. But due to her weakness requires a significant amount of effort on her behalf.  Elevated LDH, hypo-fibrinogenemia and thrombocytopenia -Labs stable, continue to follow  ICH, multifocal. L-sided weakness.  Critical illness myopathy--improving slowly. -Continue SLP, PT and OT  Acute metabolic encephalopathy, improving Sundowning in the evenings -Continue delirium precautions -Encourage appropriate sleep-wake cycles. -Continue Seroquel nightly  Acute renal failure, severe uremia.  Anuric. -Continue CVVHD per nephrology  Hypotension. Acute cor pulmonale,  improved -Remains off vasopressors at this time -Continue to observe  Hyperglycemia  -Continue insulin to maintain CBG goal of 140-180 -Continue to monitor  Pressure Injury, Skin -Per documented flowsheet above. -We will consult general surgery for consideration of bedside debridement after discussion with wound care nursing staff. -I spoke with Dr. Ninfa Linden. We appreciate their input.  Best practice:  Diet: tube feeding Pain/Anxiety/Delirium protocol (if indicated): as above VAP protocol (if indicated): yes DVT prophylaxis: SCDs , heparin GI prophylaxis: pantoprazole Glucose control: ssi/basal Mobility: bed rest Code Status: limited Family Communication: team to update family  Disposition: ICU  This patient is critically ill with multiple organ system failure; which, requires frequent high complexity decision making, assessment, support, evaluation, and titration of therapies. This was completed through the application of advanced monitoring technologies and extensive interpretation of multiple databases. During this encounter critical care time was devoted to patient care services described in this note for 34 minutes.  Garner Nash, DO [facility] 02/27/2020 9:06 AM

## 2020-02-27 NOTE — Progress Notes (Signed)
[place] for Heparin Indication: ECMO  Allergies  Allergen Reactions  . Sulfa Antibiotics Rash and Other (See Comments)    Patient Measurements: Height: 5\' 4"  (162.6 cm) Weight: 115.1 kg (253 lb 12 oz) IBW/kg (Calculated) : 54.7 Heparin Dosing Weight: 87 kg  Vital Signs: Temp: 97.7 F (36.5 C) (09/21 0400) Temp Source: Oral (09/21 0400) BP: 96/48 (09/21 0600) Pulse Rate: 106 (09/21 0600)  Labs: Recent Labs    02/24/20 1724 02/24/20 1734 02/25/20 1656 02/26/20 0416 02/26/20 0417 02/26/20 0418 02/26/20 0821 02/26/20 1648 02/26/20 1808 02/26/20 2001 02/26/20 2001 02/27/20 0403 02/27/20 0418  HGB 9.3*   < >  --  8.6*  --   --    < > 8.7*   < > 9.2*   < > 8.1* 8.5*  HCT 30.3*   < >  --  28.3*  --   --    < > 29.7*   < > 27.0*  --  26.6* 25.0*  PLT 100*   < >  --  88*  --   --   --  91*  --   --   --  89*  --   APTT 79*  --   --   --   --   --   --   --   --   --   --   --   --   HEPARINUNFRC 0.19*   < >   < >  --   --  0.29*  --  0.29*  --   --   --  0.16*  --   CREATININE 1.09*   < >   < >  --  0.95  --   --  0.88  --   --   --  1.01*  --    < > = values in this interval not displayed.    Estimated Creatinine Clearance: 78.4 mL/min (A) (by C-G formula based on SCr of 1.01 mg/dL (H)).  Assessment: 54 yo female COVID+ on VV ECMO. Patient started on bivalirudin but was found to have multiple ICH on 8/13 head CT. Bivalirudin was stopped and pt was off anticoagulation at that time. Her ECMO circuit was changed 8/24. Pharmacy asked to start low-fixed rate heparin. Heparin infusion was held last week due to bleeding from trach site.  Heparin was started at low fixed rate for pump patency in setting of ICH. Concern with increased  clotting off crrt as well as circuit clots - s/p circuit exchange 9/17 so will start to titrate heparin to a low goal of 0.2-0.3  Heparin level this morning just below goal (HL 0.16, goal of 0.2-0.3). The  systemic heparin is running at 1050 units/hr with a fixed rate of 500 units/hr running into CRRT circuit.    No complications noted, hgb down to 8.5, plt stable 80s.   Goal of Therapy:  HL 0.2-0.3 Monitor platelets by anticoagulation protocol: Yes   Plan:  Increase IV heparin 1100 uts/hr  Continue heparin level, CBC q 12 hrs.  Thank you for allowing pharmacy to be a part of this patient's care. Erin Hearing PharmD., BCPS Clinical Pharmacist 02/27/2020 9:35 AM  **Pharmacist phone directory can now be found on [email or web address]).  Listed under [place].

## 2020-02-27 NOTE — Progress Notes (Signed)
°  Speech Language Pathology Treatment: Dysphagia  Patient Details Name: Alisha Stephenson MRN: [id number] DOB: 11/08/1965 Today's Date: 02/27/2020 Time: 6606-0045 SLP Time Calculation (min) (ACUTE ONLY): 29 min  Assessment / Plan / Recommendation Clinical Impression  Pt seen for therapy session with RT and SLP with inline PMSV session. RT and Dr Valeta Harms adjusted vent settings to PS with increased level of pressure this trial. Pt tolerated session with adequate airflow to upper airway. She is taking control of initiating inspiration and ending exhalation with better timing today. This was aided externally by verbal cues, instructions to take a deep breath and blow out, and by use of EMST device. Pt was able to time deep inspiration with inspiratory volume reading at 1100cc, and then exhale with force to pass air through EMST device set at 5cm H20 over at least 10 trials. Pt was easily distracted by pain and needed encouragement from husband and RT to focus on task. Pt still struggles to achieve audible phonation, but does best when spontaneously fussing at her husband. One phrase length utterance with audible phonation achieved today. Will continue efforts. Pt making gradual progress.   HPI HPI: 54 y/o female admitted on 8/2 with severe acute respiratory failure with hypoxemia due to COVID 19 pneumonia.  She developed symptoms 1 week prior to admission. Intubated 8/3, ECMO started 8/9, ICH on 8/13 Ct reads Multifocal parenchymal hemorrhages, with the largest within the right parietal lobe. Trach placed 8/30, PEG on 9/10. MD writes on 9/13 "ultra lung protective ventilation.  No trach collar trials as this may delay ECMO weaning trials.  Okay to try Passy-Muir trials on the vent."      SLP Plan  Continue with current plan of care       Recommendations         Patient may use Passy-Muir Speech Valve: with SLP only         Plan: Continue with current plan of care       GO                Herbie Baltimore, Register Pager [phone] Office [phone]  Alisha Stephenson 02/27/2020, 10:51 AM

## 2020-02-27 NOTE — Consult Note (Addendum)
[facility] Surgery Consult Note  Alisha Stephenson 05-19-1966  [id number].    Requesting MD: Icard Chief Complaint/Reason for Consult: sacral wound  HPI:  Patient is a 54 year old female who was admitted 8/2 with severe acute respiratory failure with hypoxemia secondary to COVID PNA. Developed symptoms 1 week prior to admission. She has been on ECMO since 8/9. She is s/p tracheostomy and still requiring vent support intermittently. Hospital course has also been complicated by ICH and Acute renal failure. General surgery asked to evaluate for debridement of sacral pressure ulcer.   PMH otherwise significant for DM2, Diverticulitis, Gallstones, Ovarian cyst, NAFLD, Asthma. Past surgical hx significant for cesarean, abdominal hysterectomy, cholecystectomy. Patient was not on blood thinners PTA. Allergic to sulfa drugs. Has not had COVID vaccines.   ROS: Review of Systems  Reason unable to perform ROS: limited secondary to critical illness.  Respiratory:       S/p trach   Gastrointestinal: Negative for abdominal pain, nausea and vomiting.  Genitourinary:       Sacral wound   Neurological: Positive for weakness.    Family History  Problem Relation Age of Onset  . Diabetes Father   . Heart disease Father   . Breast cancer Maternal Grandmother   . Breast cancer Maternal Aunt   . Colon cancer Neg Hx   . Esophageal cancer Neg Hx     Past Medical History:  Diagnosis Date  . Asthma   . Diabetes mellitus without complication ([place])   . Diverticulitis   . Gallstones   . IBS (irritable bowel syndrome)   . NAFLD (nonalcoholic fatty liver disease)    CT scan 2015  . Ovarian cyst     Past Surgical History:  Procedure Laterality Date  . ABDOMINAL HYSTERECTOMY    . CESAREAN SECTION    . CHOLECYSTECTOMY    . COLONOSCOPY     about 2011  . ECMO CANNULATION N/A 01/22/2020   Procedure: ECMO CANNULATION;  Surgeon: Jolaine Artist, MD;  Location: [place] [facility] LAB;  Service:  Cardiovascular;  Laterality: N/A;  . IR GASTR TUBE CONVERT GASTR-JEJ PER W/FL MOD SED  02/16/2020  . IR GASTROSTOMY TUBE MOD SED  02/16/2020    Social History:  reports that she has quit smoking. She has never used smokeless tobacco. She reports that she does not drink alcohol and does not use drugs.  Allergies:  Allergies  Allergen Reactions  . Sulfa Antibiotics Rash and Other (See Comments)    Medications Prior to Admission  Medication Sig Dispense Refill  . albuterol (PROVENTIL HFA;VENTOLIN HFA) 108 (90 Base) MCG/ACT inhaler Inhale 2 puffs into the lungs every 6 (six) hours as needed for wheezing or shortness of breath. 1 Inhaler 2  . azithromycin (ZITHROMAX) 250 MG tablet Take 2 tabs the first day and then 1 tab daily until you run out. 6 tablet 0  . benzonatate (TESSALON) 200 MG capsule Take 200 mg by mouth 3 (three) times daily.    . Blood Glucose Monitoring Suppl (ONETOUCH VERIO) w/Device KIT Test as directed once weekly to check blood sugar. 1 kit 0  . dicyclomine (BENTYL) 10 MG capsule Take 1 capsule (10 mg total) by mouth 4 (four) times daily -  before meals and at bedtime. 120 capsule 6  . doxycycline (VIBRA-TABS) 100 MG tablet Take 100 mg by mouth 2 (two) times daily. 10 day supply    . fluticasone (FLONASE) 50 MCG/ACT nasal spray PLACE 2 SPRAYS INTO BOTH NOSTRILS DAILY.  16 g 1  . glucose blood (ONETOUCH VERIO) test strip USE STRIP TO CHECK GLUCOSE THREE TIMES DAILY 300 each 3  . ibuprofen (ADVIL) 600 MG tablet Take 1 tablet (600 mg total) by mouth every 8 (eight) hours as needed. 30 tablet 0  . insulin lispro (HUMALOG) 100 UNIT/ML KwikPen 15 units with each meal + 8 g/1 unit sliding scale. 15 mL 11  . meloxicam (MOBIC) 7.5 MG tablet Take 1 tablet (7.5 mg total) by mouth daily. 14 tablet 0  . ONE TOUCH LANCETS MISC Use once a week to check blood sugar. 1001 each 1  . tiZANidine (ZANAFLEX) 2 MG tablet Take 1 tablet (2 mg total) by mouth every 8 (eight) hours as needed for muscle  spasms. 15 tablet 0  . TOUJEO MAX SOLOSTAR 300 UNIT/ML Solostar Pen INJECT 62 UNITS INTO THE SKIN 2 (TWO) TIMES DAILY. (Patient taking differently: Inject 62 Units into the skin 2 (two) times daily. ) 12 mL 3    Blood pressure (!) 96/48, pulse (!) 106, temperature 97.7 F (36.5 C), temperature source Oral, resp. rate (!) 28, height 5' 4"  (1.626 m), weight 115.1 kg, SpO2 91 %. Physical Exam:  General: WD, morbidly obese female who is critically ill HEENT: Sclera are noninjected.  PERRL.  Ears and nose without any masses or lesions.  Mouth is pink and moist Heart: tachycardia. ECMO lines in place Lungs: trach present, breathing labored with movement  Abd: soft, NT, ND GU: sacral wound as noted below with eschar overlying, painful on probing   MS: R fingers black and necrotic  Skin: warm and dry with no masses, lesions, or rashes  Results for orders placed or performed during the hospital encounter of 01/09/2020 (from the past 48 hour(s))  Type and screen Please keep 4 units ahead for ECMO. [place]     Status: None (Preliminary result)   Collection Time: 02/25/20 10:03 AM  Result Value Ref Range   ABO/RH(D) O POS    Antibody Screen NEG    Sample Expiration      02/28/2020,2359 Performed at [facility], [place] [address], [place], [place] [address]    Unit Number [id number]    Blood Component Type RED CELLS,LR    Unit division 00    Status of Unit ALLOCATED    Transfusion Status OK TO TRANSFUSE    Crossmatch Result Compatible    Unit Number [id number]    Blood Component Type RED CELLS,LR    Unit division 00    Status of Unit ALLOCATED    Transfusion Status OK TO TRANSFUSE    Crossmatch Result Compatible    Unit Number [id number]    Blood Component Type RED CELLS,LR    Unit division 00    Status of Unit ALLOCATED    Transfusion Status OK TO TRANSFUSE    Crossmatch Result Compatible    Unit Number [id number]    Blood Component Type RED  CELLS,LR    Unit division 00    Status of Unit ALLOCATED    Transfusion Status OK TO TRANSFUSE    Crossmatch Result Compatible   Glucose, capillary     Status: Abnormal   Collection Time: 02/25/20  1:10 PM  Result Value Ref Range   Glucose-Capillary 242 (H) 70 - 99 mg/dL    Comment: Glucose reference range applies only to samples taken after fasting for at least 8 hours.  I-STAT 7, (LYTES, BLD GAS, ICA, H+H)     Status:  Abnormal   Collection Time: 02/25/20  2:37 PM  Result Value Ref Range   pH, Arterial 7.323 (L) 7.35 - 7.45   pCO2 arterial 53.2 (H) 32 - 48 mmHg   pO2, Arterial 78 (L) 83 - 108 mmHg   Bicarbonate 27.8 20.0 - 28.0 mmol/L   TCO2 29 22 - 32 mmol/L   O2 Saturation 94.0 %   Acid-Base Excess 1.0 0.0 - 2.0 mmol/L   Sodium 138 135 - 145 mmol/L   Potassium 4.5 3.5 - 5.1 mmol/L   Calcium, Ion 1.23 1.15 - 1.40 mmol/L   HCT 30.0 (L) 36 - 46 %   Hemoglobin 10.2 (L) 12.0 - 15.0 g/dL   Patient temperature 97.9 F    Collection site Magazine features editor by Operator    Sample type ARTERIAL   Lactic acid, plasma     Status: None   Collection Time: 02/25/20  4:56 PM  Result Value Ref Range   Lactic Acid, Venous 1.6 0.5 - 1.9 mmol/L    Comment: Performed at [facility], [address], [place], [place] [address]  CBC     Status: Abnormal   Collection Time: 02/25/20  4:56 PM  Result Value Ref Range   WBC 9.0 4.0 - 10.5 K/uL   RBC 3.02 (L) 3.87 - 5.11 MIL/uL   Hemoglobin 9.3 (L) 12.0 - 15.0 g/dL   HCT 30.9 (L) 36 - 46 %   MCV 102.3 (H) 80.0 - 100.0 fL   MCH 30.8 26.0 - 34.0 pg   MCHC 30.1 30.0 - 36.0 g/dL   RDW 23.0 (H) 11.5 - 15.5 %   Platelets 99 (L) 150 - 400 K/uL    Comment: REPEATED TO VERIFY SPECIMEN CHECKED FOR CLOTS Immature Platelet Fraction may be clinically indicated, consider ordering this additional test [id number] CONSISTENT WITH PREVIOUS RESULT    nRBC 0.7 (H) 0.0 - 0.2 %    Comment: Performed at [facility], [place] [address], [place], [place]  [address]  Renal function panel (daily at 1600)     Status: Abnormal   Collection Time: 02/25/20  4:56 PM  Result Value Ref Range   Sodium 136 135 - 145 mmol/L   Potassium 4.8 3.5 - 5.1 mmol/L   Chloride 101 98 - 111 mmol/L   CO2 25 22 - 32 mmol/L   Glucose, Bld 237 (H) 70 - 99 mg/dL    Comment: Glucose reference range applies only to samples taken after fasting for at least 8 hours.   BUN 35 (H) 6 - 20 mg/dL   Creatinine, Ser 0.97 0.44 - 1.00 mg/dL   Calcium 8.9 8.9 - 10.3 mg/dL   Phosphorus 3.0 2.5 - 4.6 mg/dL   Albumin 3.4 (L) 3.5 - 5.0 g/dL   GFR calc non Af Amer >60 >60 mL/min   GFR calc Af Amer >60 >60 mL/min   Anion gap 10 5 - 15    Comment: Performed at [facility] [address], [place], [place] [address]  Heparin level (unfractionated)     Status: Abnormal   Collection Time: 02/25/20  4:56 PM  Result Value Ref Range   Heparin Unfractionated 0.18 (L) 0.30 - 0.70 IU/mL    Comment: (NOTE) If heparin results are below expected values, and patient dosage has  been confirmed, suggest follow up testing of antithrombin III levels. Performed at [facility], [place] [address] [place], [place] [address]   Glucose, capillary  Status: Abnormal   Collection Time: 02/25/20  5:08 PM  Result Value Ref Range   Glucose-Capillary 244 (H) 70 - 99 mg/dL    Comment: Glucose reference range applies only to samples taken after fasting for at least 8 hours.  I-STAT 7, (LYTES, BLD GAS, ICA, H+H)     Status: Abnormal   Collection Time: 02/25/20  5:11 PM  Result Value Ref Range   pH, Arterial 7.307 (L) 7.35 - 7.45   pCO2 arterial 53.8 (H) 32 - 48 mmHg   pO2, Arterial 84 83 - 108 mmHg   Bicarbonate 27.0 20.0 - 28.0 mmol/L   TCO2 29 22 - 32 mmol/L   O2 Saturation 95.0 %   Acid-Base Excess 0.0 0.0 - 2.0 mmol/L   Sodium 137 135 - 145 mmol/L   Potassium 4.9 3.5 - 5.1 mmol/L   Calcium, Ion 1.25 1.15 - 1.40 mmol/L   HCT 30.0 (L) 36 - 46 %   Hemoglobin 10.2 (L) 12.0 - 15.0 g/dL    Patient temperature 97.6 F    Collection site Magazine features editor by Operator    Sample type ARTERIAL   Glucose, capillary     Status: Abnormal   Collection Time: 02/25/20  8:03 PM  Result Value Ref Range   Glucose-Capillary 138 (H) 70 - 99 mg/dL    Comment: Glucose reference range applies only to samples taken after fasting for at least 8 hours.  I-STAT 7, (LYTES, BLD GAS, ICA, H+H)     Status: Abnormal   Collection Time: 02/25/20  8:26 PM  Result Value Ref Range   pH, Arterial 7.351 7.35 - 7.45   pCO2 arterial 42.9 32 - 48 mmHg   pO2, Arterial 52 (L) 83 - 108 mmHg   Bicarbonate 23.7 20.0 - 28.0 mmol/L   TCO2 25 22 - 32 mmol/L   O2 Saturation 85.0 %   Acid-base deficit 2.0 0.0 - 2.0 mmol/L   Sodium 141 135 - 145 mmol/L   Potassium 4.2 3.5 - 5.1 mmol/L   Calcium, Ion 1.12 (L) 1.15 - 1.40 mmol/L   HCT 26.0 (L) 36 - 46 %   Hemoglobin 8.8 (L) 12.0 - 15.0 g/dL   Sample type ARTERIAL   Glucose, capillary     Status: Abnormal   Collection Time: 02/25/20 11:52 PM  Result Value Ref Range   Glucose-Capillary 192 (H) 70 - 99 mg/dL    Comment: Glucose reference range applies only to samples taken after fasting for at least 8 hours.  Glucose, capillary     Status: Abnormal   Collection Time: 02/26/20  4:08 AM  Result Value Ref Range   Glucose-Capillary 267 (H) 70 - 99 mg/dL    Comment: Glucose reference range applies only to samples taken after fasting for at least 8 hours.  I-STAT 7, (LYTES, BLD GAS, ICA, H+H)     Status: Abnormal   Collection Time: 02/26/20  4:09 AM  Result Value Ref Range   pH, Arterial 7.326 (L) 7.35 - 7.45   pCO2 arterial 52.5 (H) 32 - 48 mmHg   pO2, Arterial 74 (L) 83 - 108 mmHg   Bicarbonate 27.4 20.0 - 28.0 mmol/L   TCO2 29 22 - 32 mmol/L   O2 Saturation 93.0 %   Acid-Base Excess 1.0 0.0 - 2.0 mmol/L   Sodium 136 135 - 145 mmol/L   Potassium 4.5 3.5 - 5.1 mmol/L   Calcium, Ion 1.23 1.15 - 1.40 mmol/L   HCT 27.0 (L)  36 - 46 %   Hemoglobin 9.2 (L) 12.0 - 15.0  g/dL   Sample type ARTERIAL   Fibrinogen     Status: Abnormal   Collection Time: 02/26/20  4:16 AM  Result Value Ref Range   Fibrinogen 540 (H) 210 - 475 mg/dL    Comment: Performed at [facility] [address], [place], [place] [address]  CBC     Status: Abnormal   Collection Time: 02/26/20  4:16 AM  Result Value Ref Range   WBC 8.0 4.0 - 10.5 K/uL   RBC 2.78 (L) 3.87 - 5.11 MIL/uL   Hemoglobin 8.6 (L) 12.0 - 15.0 g/dL   HCT 28.3 (L) 36 - 46 %   MCV 101.8 (H) 80.0 - 100.0 fL   MCH 30.9 26.0 - 34.0 pg   MCHC 30.4 30.0 - 36.0 g/dL   RDW 22.5 (H) 11.5 - 15.5 %   Platelets 88 (L) 150 - 400 K/uL    Comment: REPEATED TO VERIFY Immature Platelet Fraction may be clinically indicated, consider ordering this additional test [id number]    nRBC 0.4 (H) 0.0 - 0.2 %    Comment: Performed at [facility], [place] [address], [place], [place] [address]  Renal function panel (daily at 0500)     Status: Abnormal   Collection Time: 02/26/20  4:17 AM  Result Value Ref Range   Sodium 135 135 - 145 mmol/L   Potassium 4.5 3.5 - 5.1 mmol/L   Chloride 100 98 - 111 mmol/L   CO2 25 22 - 32 mmol/L   Glucose, Bld 290 (H) 70 - 99 mg/dL    Comment: Glucose reference range applies only to samples taken after fasting for at least 8 hours.   BUN 36 (H) 6 - 20 mg/dL   Creatinine, Ser 0.95 0.44 - 1.00 mg/dL   Calcium 8.9 8.9 - 10.3 mg/dL   Phosphorus 2.6 2.5 - 4.6 mg/dL   Albumin 3.4 (L) 3.5 - 5.0 g/dL   GFR calc non Af Amer >60 >60 mL/min   GFR calc Af Amer >60 >60 mL/min   Anion gap 10 5 - 15    Comment: Performed at [facility] [address], [place], [place] [address]  Lactate dehydrogenase     Status: Abnormal   Collection Time: 02/26/20  4:17 AM  Result Value Ref Range   LDH 298 (H) 98 - 192 U/L    Comment: Performed at [facility], [place] [address], [place], [place] [address]  Magnesium     Status: Abnormal   Collection Time: 02/26/20  4:17 AM  Result Value Ref Range    Magnesium 2.7 (H) 1.7 - 2.4 mg/dL    Comment: Performed at [facility] [address], [place], [place] [address]  Heparin level (unfractionated)     Status: Abnormal   Collection Time: 02/26/20  4:18 AM  Result Value Ref Range   Heparin Unfractionated 0.29 (L) 0.30 - 0.70 IU/mL    Comment: (NOTE) If heparin results are below expected values, and patient dosage has  been confirmed, suggest follow up testing of antithrombin III levels. Performed at [facility], [place] [address] [place], [place] [address]   Lactic acid, plasma     Status: None   Collection Time: 02/26/20  4:27 AM  Result Value Ref Range   Lactic Acid, Venous 1.1 0.5 - 1.9 mmol/L    Comment: Performed at [facility],  [address], [place], [place] [address]  Glucose, capillary     Status: Abnormal   Collection Time: 02/26/20  8:18 AM  Result Value Ref Range   Glucose-Capillary 278 (H) 70 - 99 mg/dL    Comment: Glucose reference range applies only to samples taken after fasting for at least 8 hours.  I-STAT 7, (LYTES, BLD GAS, ICA, H+H)     Status: Abnormal   Collection Time: 02/26/20  8:21 AM  Result Value Ref Range   pH, Arterial 7.345 (L) 7.35 - 7.45   pCO2 arterial 54.4 (H) 32 - 48 mmHg   pO2, Arterial 71 (L) 83 - 108 mmHg   Bicarbonate 29.8 (H) 20.0 - 28.0 mmol/L   TCO2 31 22 - 32 mmol/L   O2 Saturation 93.0 %   Acid-Base Excess 3.0 (H) 0.0 - 2.0 mmol/L   Sodium 137 135 - 145 mmol/L   Potassium 5.4 (H) 3.5 - 5.1 mmol/L   Calcium, Ion 1.22 1.15 - 1.40 mmol/L   HCT 28.0 (L) 36 - 46 %   Hemoglobin 9.5 (L) 12.0 - 15.0 g/dL   Patient temperature 98.0 F    Collection site Magazine features editor by Operator    Sample type ARTERIAL   I-STAT 7, (LYTES, BLD GAS, ICA, H+H)     Status: Abnormal   Collection Time: 02/26/20 10:00 AM  Result Value Ref Range   pH, Arterial 7.294 (L) 7.35 - 7.45   pCO2 arterial 54.9 (H) 32 - 48 mmHg   pO2, Arterial 73 (L) 83 - 108 mmHg   Bicarbonate 26.7 20.0 - 28.0  mmol/L   TCO2 28 22 - 32 mmol/L   O2 Saturation 93.0 %   Acid-Base Excess 0.0 0.0 - 2.0 mmol/L   Sodium 138 135 - 145 mmol/L   Potassium 4.4 3.5 - 5.1 mmol/L   Calcium, Ion 1.24 1.15 - 1.40 mmol/L   HCT 28.0 (L) 36 - 46 %   Hemoglobin 9.5 (L) 12.0 - 15.0 g/dL   Patient temperature 98.0 F    Collection site Magazine features editor by Operator    Sample type ARTERIAL   Glucose, capillary     Status: Abnormal   Collection Time: 02/26/20 11:28 AM  Result Value Ref Range   Glucose-Capillary 259 (H) 70 - 99 mg/dL    Comment: Glucose reference range applies only to samples taken after fasting for at least 8 hours.  Glucose, capillary     Status: Abnormal   Collection Time: 02/26/20  4:20 PM  Result Value Ref Range   Glucose-Capillary 211 (H) 70 - 99 mg/dL    Comment: Glucose reference range applies only to samples taken after fasting for at least 8 hours.  I-STAT 7, (LYTES, BLD GAS, ICA, H+H)     Status: Abnormal   Collection Time: 02/26/20  4:22 PM  Result Value Ref Range   pH, Arterial 7.294 (L) 7.35 - 7.45   pCO2 arterial 57.1 (H) 32 - 48 mmHg   pO2, Arterial 67 (L) 83 - 108 mmHg   Bicarbonate 27.9 20.0 - 28.0 mmol/L   TCO2 30 22 - 32 mmol/L   O2 Saturation 91.0 %   Acid-Base Excess 1.0 0.0 - 2.0 mmol/L   Sodium 137 135 - 145 mmol/L   Potassium 5.0 3.5 - 5.1 mmol/L   Calcium, Ion 1.24 1.15 - 1.40 mmol/L   HCT 28.0 (L) 36 - 46 %   Hemoglobin 9.5 (L) 12.0 - 15.0  g/dL   Patient temperature 97.7 F    Collection site Magazine features editor by Operator    Sample type ARTERIAL   CBC     Status: Abnormal   Collection Time: 02/26/20  4:48 PM  Result Value Ref Range   WBC 8.5 4.0 - 10.5 K/uL   RBC 2.85 (L) 3.87 - 5.11 MIL/uL   Hemoglobin 8.7 (L) 12.0 - 15.0 g/dL   HCT 29.7 (L) 36 - 46 %   MCV 104.2 (H) 80.0 - 100.0 fL   MCH 30.5 26.0 - 34.0 pg   MCHC 29.3 (L) 30.0 - 36.0 g/dL   RDW 22.4 (H) 11.5 - 15.5 %   Platelets 91 (L) 150 - 400 K/uL    Comment: REPEATED TO VERIFY PLATELET COUNT  CONFIRMED BY SMEAR Immature Platelet Fraction may be clinically indicated, consider ordering this additional test [id number]    nRBC 0.4 (H) 0.0 - 0.2 %    Comment: Performed at [facility], [address], [place], [place] [address]  Basic metabolic panel     Status: Abnormal   Collection Time: 02/26/20  4:48 PM  Result Value Ref Range   Sodium 135 135 - 145 mmol/L   Potassium 5.0 3.5 - 5.1 mmol/L   Chloride 101 98 - 111 mmol/L   CO2 26 22 - 32 mmol/L   Glucose, Bld 216 (H) 70 - 99 mg/dL    Comment: Glucose reference range applies only to samples taken after fasting for at least 8 hours.   BUN 31 (H) 6 - 20 mg/dL   Creatinine, Ser 0.88 0.44 - 1.00 mg/dL   Calcium 9.0 8.9 - 10.3 mg/dL   GFR calc non Af Amer >60 >60 mL/min   GFR calc Af Amer >60 >60 mL/min   Anion gap 8 5 - 15    Comment: Performed at [facility] [address], [place], [place] [address]  Heparin level (unfractionated)     Status: Abnormal   Collection Time: 02/26/20  4:48 PM  Result Value Ref Range   Heparin Unfractionated 0.29 (L) 0.30 - 0.70 IU/mL    Comment: (NOTE) If heparin results are below expected values, and patient dosage has  been confirmed, suggest follow up testing of antithrombin III levels. Performed at [facility], [place] [address] [place], [place] [address]   Lactic acid, plasma     Status: None   Collection Time: 02/26/20  5:15 PM  Result Value Ref Range   Lactic Acid, Venous 1.5 0.5 - 1.9 mmol/L    Comment: Performed at [facility] [address] [place], [place] [address]  I-STAT 7, (LYTES, BLD GAS, ICA, H+H)     Status: Abnormal   Collection Time: 02/26/20  6:08 PM  Result Value Ref Range   pH, Arterial 7.327 (L) 7.35 - 7.45   pCO2 arterial 51.6 (H) 32 - 48 mmHg   pO2, Arterial 65 (L) 83 - 108 mmHg   Bicarbonate 27.2 20.0 - 28.0 mmol/L   TCO2 29 22 - 32 mmol/L   O2 Saturation 91.0 %   Acid-Base Excess 1.0 0.0 - 2.0 mmol/L   Sodium 135 135 - 145 mmol/L    Potassium 4.7 3.5 - 5.1 mmol/L   Calcium, Ion 1.26 1.15 - 1.40 mmol/L   HCT 26.0 (L) 36 - 46 %   Hemoglobin 8.8 (L) 12.0 - 15.0 g/dL   Patient temperature 97.7 F    Collection site  art line    Drawn by RT    Sample type ARTERIAL   Glucose, capillary     Status: Abnormal   Collection Time: 02/26/20  7:58 PM  Result Value Ref Range   Glucose-Capillary 232 (H) 70 - 99 mg/dL    Comment: Glucose reference range applies only to samples taken after fasting for at least 8 hours.  I-STAT 7, (LYTES, BLD GAS, ICA, H+H)     Status: Abnormal   Collection Time: 02/26/20  8:01 PM  Result Value Ref Range   pH, Arterial 7.328 (L) 7.35 - 7.45   pCO2 arterial 52.4 (H) 32 - 48 mmHg   pO2, Arterial 70 (L) 83 - 108 mmHg   Bicarbonate 27.6 20.0 - 28.0 mmol/L   TCO2 29 22 - 32 mmol/L   O2 Saturation 93.0 %   Acid-Base Excess 1.0 0.0 - 2.0 mmol/L   Sodium 136 135 - 145 mmol/L   Potassium 4.7 3.5 - 5.1 mmol/L   Calcium, Ion 1.22 1.15 - 1.40 mmol/L   HCT 27.0 (L) 36 - 46 %   Hemoglobin 9.2 (L) 12.0 - 15.0 g/dL   Patient temperature 97.8 F    Sample type ARTERIAL   Glucose, capillary     Status: Abnormal   Collection Time: 02/26/20 11:23 PM  Result Value Ref Range   Glucose-Capillary 177 (H) 70 - 99 mg/dL    Comment: Glucose reference range applies only to samples taken after fasting for at least 8 hours.  Renal function panel (daily at 1600)     Status: Abnormal   Collection Time: 02/27/20  4:03 AM  Result Value Ref Range   Sodium 134 (L) 135 - 145 mmol/L   Potassium 4.8 3.5 - 5.1 mmol/L   Chloride 101 98 - 111 mmol/L   CO2 25 22 - 32 mmol/L   Glucose, Bld 208 (H) 70 - 99 mg/dL    Comment: Glucose reference range applies only to samples taken after fasting for at least 8 hours.   BUN 34 (H) 6 - 20 mg/dL   Creatinine, Ser 1.01 (H) 0.44 - 1.00 mg/dL   Calcium 8.8 (L) 8.9 - 10.3 mg/dL   Phosphorus 1.9 (L) 2.5 - 4.6 mg/dL   Albumin 3.2 (L) 3.5 - 5.0 g/dL   GFR calc non Af Amer >60 >60 mL/min    GFR calc Af Amer >60 >60 mL/min   Anion gap 8 5 - 15    Comment: Performed at [facility] [address] [place], [place] [address]  Lactic acid, plasma     Status: None   Collection Time: 02/27/20  4:03 AM  Result Value Ref Range   Lactic Acid, Venous 1.4 0.5 - 1.9 mmol/L    Comment: Performed at [facility] [address] [place], [place] [address]  Fibrinogen     Status: Abnormal   Collection Time: 02/27/20  4:03 AM  Result Value Ref Range   Fibrinogen 669 (H) 210 - 475 mg/dL    Comment: Performed at [facility] [address], [place] [address]  CBC     Status: Abnormal   Collection Time: 02/27/20  4:03 AM  Result Value Ref Range   WBC 6.7 4.0 - 10.5 K/uL   RBC 2.59 (L) 3.87 - 5.11 MIL/uL   Hemoglobin 8.1 (L) 12.0 - 15.0 g/dL   HCT 26.6 (L) 36 - 46 %   MCV 102.7 (H) 80.0 - 100.0 fL  MCH 31.3 26.0 - 34.0 pg   MCHC 30.5 30.0 - 36.0 g/dL   RDW 22.4 (H) 11.5 - 15.5 %   Platelets 89 (L) 150 - 400 K/uL    Comment: Immature Platelet Fraction may be clinically indicated, consider ordering this additional test [id number] CONSISTENT WITH PREVIOUS RESULT    nRBC 0.6 (H) 0.0 - 0.2 %    Comment: Performed at [facility] [address], [place], [place] [address]  Heparin level (unfractionated)     Status: Abnormal   Collection Time: 02/27/20  4:03 AM  Result Value Ref Range   Heparin Unfractionated 0.16 (L) 0.30 - 0.70 IU/mL    Comment: (NOTE) If heparin results are below expected values, and patient dosage has  been confirmed, suggest follow up testing of antithrombin III levels. Performed at [facility], [place] [address], [place], [place] [address]   Lactate dehydrogenase     Status: Abnormal   Collection Time: 02/27/20  4:03 AM  Result Value Ref Range   LDH 276 (H) 98 - 192 U/L    Comment: Performed at [facility], [place] [address], [place], [place] [address]  Magnesium     Status: Abnormal   Collection Time: 02/27/20   4:03 AM  Result Value Ref Range   Magnesium 2.7 (H) 1.7 - 2.4 mg/dL    Comment: Performed at [facility] [address], [place], [place] [address]  Glucose, capillary     Status: Abnormal   Collection Time: 02/27/20  4:15 AM  Result Value Ref Range   Glucose-Capillary 193 (H) 70 - 99 mg/dL    Comment: Glucose reference range applies only to samples taken after fasting for at least 8 hours.  I-STAT 7, (LYTES, BLD GAS, ICA, H+H)     Status: Abnormal   Collection Time: 02/27/20  4:18 AM  Result Value Ref Range   pH, Arterial 7.368 7.35 - 7.45   pCO2 arterial 47.1 32 - 48 mmHg   pO2, Arterial 100 83 - 108 mmHg   Bicarbonate 27.2 20.0 - 28.0 mmol/L   TCO2 29 22 - 32 mmol/L   O2 Saturation 98.0 %   Acid-Base Excess 1.0 0.0 - 2.0 mmol/L   Sodium 135 135 - 145 mmol/L   Potassium 4.8 3.5 - 5.1 mmol/L   Calcium, Ion 1.21 1.15 - 1.40 mmol/L   HCT 25.0 (L) 36 - 46 %   Hemoglobin 8.5 (L) 12.0 - 15.0 g/dL   Patient temperature 97.7 F    Sample type ARTERIAL   Glucose, capillary     Status: Abnormal   Collection Time: 02/27/20  8:01 AM  Result Value Ref Range   Glucose-Capillary 262 (H) 70 - 99 mg/dL    Comment: Glucose reference range applies only to samples taken after fasting for at least 8 hours.   DG CHEST PORT 1 VIEW  Result Date: 02/27/2020 CLINICAL DATA:  Tracheostomy.  ECMO. EXAM: PORTABLE CHEST 1 VIEW COMPARISON:  02/26/2020. FINDINGS: Tracheostomy tube, left IJ line, right PICC line in stable position. ECMO device in unchanged position. Heart size stable. Diffuse severe bilateral pulmonary infiltrates again noted without interim change. Small right pleural effusion cannot be excluded. No pneumothorax. Prior cervical spine fusion. IMPRESSION: 1.  Lines and tubes in unchanged position. 2. Diffuse severe bilateral pulmonary infiltrates again noted without interim change. Small right pleural effusion cannot be excluded. Electronically Signed   By: Marcello Moores  Register   On: 02/27/2020  06:57  DG CHEST PORT 1 VIEW  Result Date: 02/26/2020 CLINICAL DATA:  ECMO catheter placement EXAM: PORTABLE CHEST 1 VIEW COMPARISON:  02/25/2020 FINDINGS: Tracheostomy, left internal jugular temporary hemodialysis catheter with its tip overlying the superior vena cava, right upper extremity PICC line with its tip within the terminal subclavian vein, and right internal jugular ECMO catheter with markers overlying the superior vena cava, superior cavoatrial junction, and inferior cavoatrial junction are again identified. Lung volumes are small, but are symmetric and are stable since prior examination. Superimposed extensive bilateral airspace infiltrate appears stable. No pneumothorax or pleural effusion. Cardiac size within normal limits. IMPRESSION: Support lines and tubes as described above, stable from prior examination. Advancement of the ECMO catheter by roughly 3 cm to better position the middle marker over the right atrium could be considered. Stable extensive bilateral pulmonary infiltrates. Electronically Signed   By: Fidela Salisbury MD   On: 02/26/2020 06:15      Assessment/Plan DM2/Hyperglycemia  Hx of Diverticulitis Gallstones Ovarian cyst NAFLD Asthma  ARDS secondary to COVID 19 PNA On VV ECMO and vent support Tracheostomy ICH, multifocal with L sided weakness Acute metabolic encephalopathy Acute renal failure on CVVHD  Unstageable Sacral pressure wound - patient not stable enough currently to take to the OR for debridement - I do not think she would tolerate much sharp debridement at the bedside secondary to pain, also concerned about ability to control bleeding  - recommend trial of hydrotherapy to see if we can loosen eschar somewhat until patient stable enough to go to OR if still needed at that point - off load pressure as able - BID dressing changes   Norm Parcel, [facility] Surgery 02/27/2020, 9:06 AM Please see Amion for pager number during day  hours 7:00am-4:30pm

## 2020-02-27 NOTE — Plan of Care (Signed)
Problem: Education: Goal: Knowledge of General Education information will improve Description: Including pain rating scale, medication(s)/side effects and non-pharmacologic comfort measures Outcome: Progressing   Problem: Health Behavior/Discharge Planning: Goal: Ability to manage health-related needs will improve Outcome: Progressing   Problem: Clinical Measurements: Goal: Ability to maintain clinical measurements within normal limits will improve Outcome: Progressing Goal: Will remain free from infection Outcome: Progressing Goal: Diagnostic test results will improve Outcome: Progressing Goal: Respiratory complications will improve Outcome: Progressing Goal: Cardiovascular complication will be avoided Outcome: Progressing   Problem: Activity: Goal: Risk for activity intolerance will decrease Outcome: Progressing   Problem: Nutrition: Goal: Adequate nutrition will be maintained Outcome: Progressing   Problem: Coping: Goal: Level of anxiety will decrease Outcome: Progressing   Problem: Elimination: Goal: Will not experience complications related to bowel motility Outcome: Progressing Goal: Will not experience complications related to urinary retention Outcome: Progressing   Problem: Pain Managment: Goal: General experience of comfort will improve Outcome: Progressing   Problem: Safety: Goal: Ability to remain free from injury will improve Outcome: Progressing   Problem: Skin Integrity: Goal: Risk for impaired skin integrity will decrease Outcome: Progressing   Problem: Education: Goal: Knowledge of risk factors and measures for prevention of condition will improve Outcome: Progressing   Problem: Coping: Goal: Psychosocial and spiritual needs will be supported Outcome: Progressing   Problem: Respiratory: Goal: Will maintain a patent airway Outcome: Progressing Goal: Complications related to the disease process, condition or treatment will be avoided or  minimized Outcome: Progressing   Problem: Education: Goal: Knowledge of risk factors and measures for prevention of condition will improve Outcome: Progressing   Problem: Coping: Goal: Psychosocial and spiritual needs will be supported Outcome: Progressing   Problem: Respiratory: Goal: Will maintain a patent airway Outcome: Progressing Goal: Complications related to the disease process, condition or treatment will be avoided or minimized Outcome: Progressing   Problem: Education: Goal: Ability to describe self-care measures that may prevent or decrease complications (Diabetes Survival Skills Education) will improve Outcome: Progressing Goal: Individualized Educational Video(s) Outcome: Progressing   Problem: Coping: Goal: Ability to adjust to condition or change in health will improve Outcome: Progressing   Problem: Fluid Volume: Goal: Ability to maintain a balanced intake and output will improve Outcome: Progressing   Problem: Health Behavior/Discharge Planning: Goal: Ability to identify and utilize available resources and services will improve Outcome: Progressing Goal: Ability to manage health-related needs will improve Outcome: Progressing   Problem: Metabolic: Goal: Ability to maintain appropriate glucose levels will improve Outcome: Progressing   Problem: Nutritional: Goal: Maintenance of adequate nutrition will improve Outcome: Progressing Goal: Progress toward achieving an optimal weight will improve Outcome: Progressing   Problem: Skin Integrity: Goal: Risk for impaired skin integrity will decrease Outcome: Progressing   Problem: Tissue Perfusion: Goal: Adequacy of tissue perfusion will improve Outcome: Progressing   Problem: Education: Goal: Knowledge of risk factors and measures for prevention of condition will improve Outcome: Progressing   Problem: Coping: Goal: Psychosocial and spiritual needs will be supported Outcome: Progressing    Problem: Respiratory: Goal: Will maintain a patent airway Outcome: Progressing Goal: Complications related to the disease process, condition or treatment will be avoided or minimized Outcome: Progressing   Problem: Education: Goal: Knowledge of disease or condition will improve Outcome: Progressing Goal: Knowledge of secondary prevention will improve Outcome: Progressing Goal: Knowledge of patient specific risk factors addressed and post discharge goals established will improve Outcome: Progressing Goal: Individualized Educational Video(s) Outcome: Progressing   Problem: Coping: Goal: Will verbalize  positive feelings about self Outcome: Progressing Goal: Will identify appropriate support needs Outcome: Progressing   Problem: Health Behavior/Discharge Planning: Goal: Ability to manage health-related needs will improve Outcome: Progressing   Problem: Self-Care: Goal: Ability to participate in self-care as condition permits will improve Outcome: Progressing Goal: Verbalization of feelings and concerns over difficulty with self-care will improve Outcome: Progressing Goal: Ability to communicate needs accurately will improve Outcome: Progressing   Problem: Nutrition: Goal: Risk of aspiration will decrease Outcome: Progressing Goal: Dietary intake will improve Outcome: Progressing   Problem: Intracerebral Hemorrhage Tissue Perfusion: Goal: Complications of Intracerebral Hemorrhage will be minimized Outcome: Progressing   Problem: Ischemic Stroke/TIA Tissue Perfusion: Goal: Complications of ischemic stroke/TIA will be minimized Outcome: Progressing   Problem: Spontaneous Subarachnoid Hemorrhage Tissue Perfusion: Goal: Complications of Spontaneous Subarachnoid Hemorrhage will be minimized Outcome: Progressing

## 2020-02-27 NOTE — Progress Notes (Signed)
Physical Therapy Treatment Patient Details Name: Alisha Stephenson MRN: [id number] DOB: 08-21-1965 Today's Date: 02/27/2020    History of Present Illness Pt is 54 yo admitted 8/2 with hypoxemia due to Covid 19 PNA. 8/3 intubated. 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 CRRT 8/22-9/6, restarted 9/9. Reintubated 8/24 with ECMO circuit changed, 8/30 trach. 9/10 PEG tube. 9/17 circuit change. PMHx: DM, diverticulitis, Asthma    PT Comments    Pt eager to reposition as she has pain with sacral wound. Pt wanting to sit EOB instead of tilting today. Pt with continued progression of bil LE strength and trunk control sitting EOB. Pt able to sit with min assist for majority of EOB +3 for lines and safety. Pt able to perform 2-/5 plantarflexion/dorsiflexion bil today and encouraged continued HEP with spouse and tilting with nursing. Will continue to progress function.   SpO2 92-95% on 40% pressure control BP 113/51 (68) EOB   Follow Up Recommendations  LTACH;Supervision/Assistance - 24 hour     Equipment Recommendations  Other (comment) (TBD with progression)    Recommendations for Other Services       Precautions / Restrictions Precautions Precautions: Other (comment) Precaution Comments: ECMO with RIJ access anchors at right shoulder and chest, right necrotic thumb and index finger, bruised/necrotic left hand, trach, vent, CRRT, Gtube to suction, Jtube for nutrition Other Brace: B PRAFO Restrictions Weight Bearing Restrictions: No    Mobility  Bed Mobility Overal bed mobility: Needs Assistance   Rolling: Max assist;+2 for physical assistance   Supine to sit: +2 for physical assistance;Total assist Sit to supine: +2 for physical assistance;Total assist   General bed mobility comments: helicopter pivot from supine to and from sit with total +2 physical assist with RN and EcMO specialist to maintain lines. Total +3 to slide toward HOB. Pt  sat EOB 8 min. Roll bil for pericare and linen change  Transfers                    Ambulation/Gait                 Stairs             Wheelchair Mobility    Modified Rankin (Stroke Patients Only) Modified Rankin (Stroke Patients Only) Pre-Morbid Rankin Score: No symptoms Modified Rankin: Severe disability     Balance Overall balance assessment: Needs assistance Sitting-balance support: Bilateral upper extremity supported Sitting balance-Leahy Scale: Poor Sitting balance - Comments: EOB 8 min with initial mod assist with progression to mostly min assist throughout with cues for anterior/right translation. very brief moments of minguard. Assist for bil UE positioning and propping. Pt legs don't reach floor and stool under one foot Postural control: Left lateral lean;Posterior lean   Standing balance-Leahy Scale: Poor                              Cognition Arousal/Alertness: Awake/alert Behavior During Therapy: WFL for tasks assessed/performed Overall Cognitive Status: Impaired/Different from baseline Area of Impairment: Safety/judgement                       Following Commands: Follows one step commands with increased time;Follows one step commands inconsistently Safety/Judgement: Decreased awareness of deficits;Decreased awareness of safety     General Comments: pt continues to lack insight into medical stability and function. appropriately following commands and responding to questions  Exercises      General Comments        Pertinent Vitals/Pain Pain Score: 6  Pain Location: sacrum Pain Descriptors / Indicators: Grimacing;Sore;Guarding Pain Intervention(s): Limited activity within patient's tolerance;Monitored during session;Repositioned    Home Living                      Prior Function            PT Goals (current goals can now be found in the care plan section) Progress towards PT goals:  Progressing toward goals    Frequency    Min 3X/week      PT Plan Current plan remains appropriate    Co-evaluation              AM-PAC PT "6 Clicks" Mobility   Outcome Measure  Help needed turning from your back to your side while in a flat bed without using bedrails?: Total Help needed moving from lying on your back to sitting on the side of a flat bed without using bedrails?: Total Help needed moving to and from a bed to a chair (including a wheelchair)?: Total Help needed standing up from a chair using your arms (e.g., wheelchair or bedside chair)?: Total Help needed to walk in hospital room?: Total Help needed climbing 3-5 steps with a railing? : Total 6 Click Score: 6    End of Session   Activity Tolerance: Patient tolerated treatment well Patient left: in bed;with family/visitor present;Other (comment);with SCD's reapplied (bil PRAFO) Nurse Communication: Mobility status PT Visit Diagnosis: Other abnormalities of gait and mobility (R26.89);Muscle weakness (generalized) (M62.81)     Time: 8811-0315 PT Time Calculation (min) (ACUTE ONLY): 32 min  Charges:  $Therapeutic Exercise: 8-22 mins $Therapeutic Activity: 8-22 mins                     Mayme Profeta P, PT Acute Rehabilitation Services Pager: [phone] Office: [phone]    Rhodie Cienfuegos B Casandra Dallaire 02/27/2020, 11:44 AM

## 2020-02-27 NOTE — Progress Notes (Signed)
Patient ID: Alisha Stephenson, female   DOB: 1965/06/17, 54 y.o.   MRN: [id number]    Advanced Heart Failure Rounding Note   Subjective:    - 8/2 COVID + test - 8/9 Cannulated for VV ECMO - 8/13 with several areas of intracranial hemorrhage. Bival stopped.  - 8/14 CT no change in [place]. Increased edema - 8/16 Extubated - 8/16 Head CT stable bleed - 8/22 CVVHD started - 8/24 reintubated - 8/24 circuit changed - 8/30 trach - 9/7  Switched to iHD - 02/15/20 Switched back to CVVHD - 9/10 GJ tube placed - 9/17 Circuit change  Remains awake on vent via trach. Communicative. Following commands but very weak.   Remains on CVVHD. Pulling -25 to -50. Weight stable  Off pressors. Sweep weaned to 6.5. ABG looks good   CXR unchanged this am   ECMO  Flow 5.2 L RPM 4100 Sweep6.5  Labs: 7.37/47/100/98% 40% FiO2 TV 180 cc -> up to 800 with deep breaths on PS Hgb8.1 PLT 93k -> 89k LDH 315 -> 374 -> 383 -> 331 -> 356 -> 305 -> 291-> 298 Lactic acid1.4 Heparin 0.16  Objective:   Weight Range:  Vital Signs:   Temp:  [93.6 F (34.2 C)-98.2 F (36.8 C)] 97.7 F (36.5 C) (09/21 0400) Pulse Rate:  [87-106] 106 (09/21 0600) Resp:  [22-30] 22 (09/21 0148) BP: (79-121)/(42-75) 96/48 (09/21 0600) SpO2:  [90 %-100 %] 97 % (09/21 0600) Arterial Line BP: (89-116)/(41-64) 101/47 (09/21 0600) FiO2 (%):  [40 %] 40 % (09/21 0148) Weight:  [115.1 kg] 115.1 kg (09/21 0500) Last BM Date: 02/26/20  Weight change: Filed Weights   02/25/20 0452 02/26/20 0500 02/27/20 0500  Weight: 113.5 kg 114 kg 115.1 kg    Intake/Output:   Intake/Output Summary (Last 24 hours) at 02/27/2020 0714 Last data filed at 02/27/2020 0700 Gross per 24 hour  Intake 1961.96 ml  Output 2842 ml  Net -880.04 ml     Physical Exam: General:  Sitting up in bed awake following commands HEENT: normal Neck: RIJ ECMO cannula. LIJ trialysis + trach Cor: PMI nondisplaced. Regular rate & rhythm. No rubs, gallops or  murmurs. Lungs: clear with limited air movement  Abdomen: obese soft, nontender, nondistended. + PEG Extremities: no cyanosis, clubbing, rash, tr edema Neuro: alert communicative. Very weak    Telemetry: Sinus 90-110 Personally reviewed   Labs: Basic Metabolic Panel: Recent Labs  Lab 02/23/20 0307 02/23/20 0320 02/24/20 0437 02/24/20 0452 02/24/20 1724 02/24/20 1734 02/25/20 0500 02/25/20 0802 02/25/20 1656 02/25/20 1711 02/26/20 0417 02/26/20 0821 02/26/20 1648 02/26/20 1808 02/26/20 2001 02/27/20 0403 02/27/20 0418  NA 135   < > 135   < > 135   < > 136   < > 136   < > 135   < > 135 135 136 134* 135  K 4.7   < > 4.7   < > 4.9   < > 4.5   < > 4.8   < > 4.5   < > 5.0 4.7 4.7 4.8 4.8  CL 101   < > 101   < > 101   < > 103  --  101  --  100  --  101  --   --  101  --   CO2 25   < > 24   < > 24   < > 24  --  25  --  25  --  26  --   --  25  --   GLUCOSE 219*   < > 217*   < > 249*   < > 226*  --  237*  --  290*  --  216*  --   --  208*  --   BUN 30*   < > 37*   < > 38*   < > 37*  --  35*  --  36*  --  31*  --   --  34*  --   CREATININE 1.02*   < > 1.01*   < > 1.09*   < > 0.99  --  0.97  --  0.95  --  0.88  --   --  1.01*  --   CALCIUM 9.0   < > 9.2   < > 9.0   < > 8.8*   < > 8.9   < > 8.9  --  9.0  --   --  8.8*  --   MG 3.0*  --  3.0*  --   --   --  2.8*  --   --   --  2.7*  --   --   --   --  2.7*  --   PHOS 2.1*   < > 2.9   < > 2.7  --  2.4*  --  3.0  --  2.6  --   --   --   --  1.9*  --    < > = values in this interval not displayed.    Liver Function Tests: Recent Labs  Lab 02/24/20 1724 02/25/20 0500 02/25/20 1656 02/26/20 0417 02/27/20 0403  ALBUMIN 3.2* 3.2* 3.4* 3.4* 3.2*   No results for input(s): LIPASE, AMYLASE in the last 168 hours. No results for input(s): AMMONIA in the last 168 hours.  CBC: Recent Labs  Lab 02/25/20 0421 02/25/20 0451 02/25/20 1656 02/25/20 1711 02/26/20 0416 02/26/20 0821 02/26/20 1648 02/26/20 1808 02/26/20 2001  02/27/20 0403 02/27/20 0418  WBC 7.3  --  9.0  --  8.0  --  8.5  --   --  6.7  --   HGB 8.9*   < > 9.3*   < > 8.6*   < > 8.7* 8.8* 9.2* 8.1* 8.5*  HCT 28.9*   < > 30.9*   < > 28.3*   < > 29.7* 26.0* 27.0* 26.6* 25.0*  MCV 101.4*  --  102.3*  --  101.8*  --  104.2*  --   --  102.7*  --   PLT 100*  --  99*  --  88*  --  91*  --   --  89*  --    < > = values in this interval not displayed.    Cardiac Enzymes: No results for input(s): CKTOTAL, CKMB, CKMBINDEX, TROPONINI in the last 168 hours.  BNP: BNP (last 3 results) No results for input(s): BNP in the last 8760 hours.  ProBNP (last 3 results) No results for input(s): PROBNP in the last 8760 hours.    Other results:  Imaging: DG CHEST PORT 1 VIEW  Result Date: 02/27/2020 CLINICAL DATA:  Tracheostomy.  ECMO. EXAM: PORTABLE CHEST 1 VIEW COMPARISON:  02/26/2020. FINDINGS: Tracheostomy tube, left IJ line, right PICC line in stable position. ECMO device in unchanged position. Heart size stable. Diffuse severe bilateral pulmonary infiltrates again noted without interim change. Small right pleural effusion cannot be excluded. No pneumothorax. Prior cervical spine fusion. IMPRESSION: 1.  Lines and tubes in unchanged  position. 2. Diffuse severe bilateral pulmonary infiltrates again noted without interim change. Small right pleural effusion cannot be excluded. Electronically Signed   By: Marcello Moores  Register   On: 02/27/2020 06:57   DG CHEST PORT 1 VIEW  Result Date: 02/26/2020 CLINICAL DATA:  ECMO catheter placement EXAM: PORTABLE CHEST 1 VIEW COMPARISON:  02/25/2020 FINDINGS: Tracheostomy, left internal jugular temporary hemodialysis catheter with its tip overlying the superior vena cava, right upper extremity PICC line with its tip within the terminal subclavian vein, and right internal jugular ECMO catheter with markers overlying the superior vena cava, superior cavoatrial junction, and inferior cavoatrial junction are again identified. Lung  volumes are small, but are symmetric and are stable since prior examination. Superimposed extensive bilateral airspace infiltrate appears stable. No pneumothorax or pleural effusion. Cardiac size within normal limits. IMPRESSION: Support lines and tubes as described above, stable from prior examination. Advancement of the ECMO catheter by roughly 3 cm to better position the middle marker over the right atrium could be considered. Stable extensive bilateral pulmonary infiltrates. Electronically Signed   By: Fidela Salisbury MD   On: 02/26/2020 06:15     Medications:     Scheduled Medications: . amiodarone  200 mg Per Tube Daily  . vitamin C  500 mg Per Tube Daily  . chlorhexidine gluconate (MEDLINE KIT)  15 mL Mouth Rinse BID  . Chlorhexidine Gluconate Cloth  6 each Topical Q0600  . clonazePAM  1 mg Per Tube Q6H  . collagenase   Topical Daily  . docusate  100 mg Per Tube BID  . feeding supplement (PROSource TF)  45 mL Per Tube QID  . Gerhardt's butt cream   Topical BID  . insulin aspart  0-15 Units Subcutaneous Q4H  . insulin aspart  9 Units Subcutaneous Q4H  . insulin detemir  38 Units Subcutaneous BID  . linagliptin  5 mg Per Tube Daily  . mouth rinse  15 mL Mouth Rinse 10 times per day  . melatonin  9 mg Per Tube QHS  . metoCLOPramide (REGLAN) injection  5 mg Intravenous Q8H  . midodrine  10 mg Per Tube TID  . multivitamin  1 tablet Per Tube QHS  . oxyCODONE  10 mg Per Tube Q6H  . pantoprazole (PROTONIX) IV  40 mg Intravenous BID  . pneumococcal 23 valent vaccine  0.5 mL Intramuscular Tomorrow-1000  . polyethylene glycol  17 g Per Tube Daily  . QUEtiapine  100 mg Per Tube QHS  . sennosides  5 mL Per Tube BID  . zinc sulfate  220 mg Per Tube Daily    Infusions: .  prismasol BGK 4/2.5 400 mL/hr at 02/27/20 0256  .  prismasol BGK 4/2.5 200 mL/hr at 02/27/20 0421  . sodium chloride 500 mL (02/11/20 2215)  . sodium chloride Stopped (02/25/20 0739)  . sodium chloride Stopped  (02/24/20 2312)  . sodium chloride    . albumin human 12.5 g (02/26/20 0318)  . albumin human Stopped (02/06/20 2000)  . feeding supplement (PIVOT 1.5 CAL) 65 mL/hr at 02/27/20 0700  . heparin 10,000 units/ 20 mL infusion syringe 500 Units/hr (02/27/20 0428)  . heparin 1,050 Units/hr (02/27/20 0700)  . prismasol BGK 4/2.5 1,500 mL/hr at 02/27/20 0624    PRN Medications: sodium chloride, sodium chloride, sodium chloride, sodium chloride, acetaminophen (TYLENOL) oral liquid 160 mg/5 mL, albumin human, albuterol, alteplase, alum & mag hydroxide-simeth, dextrose, fentaNYL (SUBLIMAZE) injection, guaiFENesin-dextromethorphan, heparin, heparin, heparin, hydrALAZINE, HYDROmorphone (DILAUDID) injection, hydrOXYzine, lidocaine (PF), lidocaine-prilocaine, ondansetron **  OR** ondansetron (ZOFRAN) IV, pentafluoroprop-tetrafluoroeth   Assessment/Plan:   1. Acute hypoxic/hypercapneic respiratory failure in setting of severe COVID PNA/ARDS -> VV ECMO - admit 8/2 - intubation 8/3 - has received actmera (compelted 8/2), remdesivir (completed 8/6) and steroids - Cannulated for VV ECMO on 8/9 - Extubated 8/16. Reintubated 8/24 - s/p trach 8/30. - Circuit changed 8/24 due to concern for hemolysis and worsening oxygenation. Repeat circuit change 9/17 - CVVHD started 8/22 for volume removal and uremia. Remains anuric. CXR worse on 9/9 so switched back to CVVHD. Now keeping at -25-50/hr. D/w Renal. No change.  - Off IV sedation. Continue melatonin and seroquel for sleep at night. Adjusted to help with restlessness and sundowning at night - Stable off pressors. On midodrine 10 tid. Has wide pulse pressure but no AI on echo.  - Lung function returning very slowly. I suspect major issue is respiratory muscle weakness and poor lung compliance. Continue to wean sweep. Now down to 6.5  Suspect baseline pCO2 is in 50s. Ok to let it drift up a bit. Continue to keep her mildly acidotic and encourage respiratory muscle  training. Continue sweep wean today as tolerated - Continue heparin with level 0.2-0.3 No bleeding Discussed dosing with PharmD personally.  2. Enterococcus sepsis - Finished high-dose zosyn on 9/7 per ID - Eraxis added on 8/26 with yeast in BAL 8/24 (ended 9/2)  - Cultures repeated with increasing pressor demands - F/u cx 9/7 Remain NGTD.  - No change  3. Intracranial hemorrhage - ? Septic emboli - repeat head CT on 8/14 with stable bleeds but increased edema - neurology has seen. Suspect significant long-term injury sustained. Will follow commands. Appears to have dense LUE weakness and possibly LLE - repeat head CT stable 8/16 - tolerating heparin. No change  4. Thrombocytopenia - PLTs 93k  -> 89K.  - Continue to follow. Watch circuit. (had recent change) - Stable   5. Morbid obesity - Body mass index is Body mass index is 43.56 kg/m.  6. Poorly controlled DM2 - HgBA1c 10.7 - IV insulin converted to SQ insulin on 9/15  7. PAF - intermittent episodes. Last 8/26 - Now on po. Quiescent  8. AKI/azotemia - CVVHD started on 8/22 - Down 70 pounds.  - transitioned to [facility] on 9/7 - switched back to CVVHD for better volume removal on 9/9. - new catheter placed 9/9 - will eventually need tunneled access - Keep UF rate -25 to - 50 as above. Weight stable.  - D/w Renal  9. Emesis - Has GJ tube now, tolerating TFs  10. Sacral decub  - Wound care following. Watch for infection   11. Necrotic fingers - VVS following - suspect will auto amputate  12. Anemia - Transfuse hgb < 8   CRITICAL CARE Performed by: Glori Bickers  Total critical care time: 35 minutes  Critical care time was exclusive of separately billable procedures and treating other patients.  Critical care was necessary to treat or prevent imminent or life-threatening deterioration.  Critical care was time spent personally by me (independent of midlevel providers or residents) on the following  activities: development of treatment plan with patient and/or surrogate as well as nursing, discussions with consultants, evaluation of patient's response to treatment, examination of patient, obtaining history from patient or surrogate, ordering and performing treatments and interventions, ordering and review of laboratory studies, ordering and review of radiographic studies, pulse oximetry and re-evaluation of patient's condition.   Length of Stay: [place]  Shatina Streets  MD 02/27/2020, 7:14 AM  Advanced Heart Failure Team Pager [phone] (M-F; 7a - 4p)  Please contact [facility] for night-coverage after hours (4p -7a ) and weekends on [email or web address]

## 2020-02-27 NOTE — Procedures (Signed)
Extracorporeal support note  ECLS support day: Cannulated 01/26/2020, Day 43 Indication: COVID 19 ARDS   Configuration: ECMO Mode: VV  Drainage cannula: RIJ [place]  Return cannula: Same   Pump speed: 4100 rpm  Pump flow: Flow (LPM): 5.18  Pump used: ECMO Device: Centrimag  Oxygenator: Quadrox O2 blender: 100% Sweep gas: 5.5L  ABG    Component Value Date/Time   PHART 7.368 02/27/2020 0418   PCO2ART 47.1 02/27/2020 0418   PO2ART 100 02/27/2020 0418   HCO3 27.2 02/27/2020 0418   TCO2 29 02/27/2020 0418   ACIDBASEDEF 2.0 02/25/2020 2026   O2SAT 98.0 02/27/2020 0418    Circuit check: Case discussed with ECMO specialist at bedside Anticoagulant: Heparin per pharmacy, discussed on multidisciplinary rounds Anticoagulation targets: Anticoagulation Goal: Hep 0.2-0.3  Changes in support: Continue to weaning sweep gas, aggressive PT OT and progressive mobility. Okay to remain slightly acidotic with a goal pH of 7.35. We will attempt today a short trial of pressure supported breaths.  Anticipated goals/duration of support: Alisha Stephenson to recovery  All issues highlighted discussed on multidisciplinary team rounding.  Alisha Nash, DO [facility] 02/27/2020 9:21 AM

## 2020-02-27 NOTE — Progress Notes (Signed)
RT assisted Speech Therapy with PMV trial on vent without complications.

## 2020-02-27 NOTE — Progress Notes (Signed)
Inpatient Diabetes Program Recommendations  AACE/ADA: New Consensus Statement on Inpatient Glycemic Control (2015)  Target Ranges:  Prepandial:   less than 140 mg/dL      Peak postprandial:   less than 180 mg/dL (1-2 hours)      Critically ill patients:  140 - 180 mg/dL   Lab Results  Component Value Date   GLUCAP 262 (H) 02/27/2020   HGBA1C 10.7 (H) 02/02/2020    Review of Glycemic Control Results for Alisha Stephenson, Alisha Stephenson (MRN [id number]) as of 02/27/2020 11:25  Ref. Range 02/26/2020 16:20 02/26/2020 19:58 02/26/2020 23:23 02/27/2020 04:15 02/27/2020 08:01  Glucose-Capillary Latest Ref Range: 70 - 99 mg/dL 211 (H) 232 (H) 177 (H) 193 (H) 262 (H)   Diabetes history: DM 2 Outpatient Diabetes medications:  Toujeo 62 units bid, Humalog 15 units tid with meals Current orders for Inpatient glycemic control:  Novolog moderate q 4 hours Levemir 38 units bid Novolog 9 units q 4 hours  Inpatient Diabetes Program Recommendations:    Consider increasing Levemir to 42 units bid. Also please increase Novolog tube feed coverage to 10 units q 4 hours.   Thanks,  Adah Perl, RN, BC-ADM Inpatient Diabetes Coordinator Pager [phone] (8a-5p)

## 2020-02-27 NOTE — Progress Notes (Signed)
Savage for Heparin Indication: ECMO  Allergies  Allergen Reactions  . Sulfa Antibiotics Rash and Other (See Comments)    Patient Measurements: Height: 5\' 4"  (162.6 cm) Weight: 115.1 kg (253 lb 12 oz) IBW/kg (Calculated) : 54.7 Heparin Dosing Weight: 87 kg  Vital Signs: Temp: 98 F (36.7 C) (09/21 1600) Temp Source: Oral (09/21 1600) BP: 103/49 (09/21 1700) Pulse Rate: 98 (09/21 1700)  Labs: Recent Labs    02/26/20 0416 02/26/20 0417 02/26/20 1648 02/26/20 1808 02/27/20 0403 02/27/20 0418 02/27/20 0949 02/27/20 0949 02/27/20 1142 02/27/20 1601 02/27/20 1643  HGB 8.6*   < > 8.7*   < > 8.1*   < > 9.2*   < > 9.9* 9.5*  --   HCT 28.3*   < > 29.7*   < > 26.6*   < > 27.0*  --  29.0* 28.0*  --   PLT 88*  --  91*  --  89*  --   --   --   --   --   --   HEPARINUNFRC  --    < > 0.29*  --  0.16*  --   --   --   --   --  0.16*  CREATININE  --    < > 0.88  --  1.01*  --   --   --   --   --  1.02*   < > = values in this interval not displayed.    Estimated Creatinine Clearance: 77.6 mL/min (A) (by C-G formula based on SCr of 1.02 mg/dL (H)).  Assessment: 54 yo female COVID+ on VV ECMO. Patient started on bivalirudin but was found to have multiple ICH on 8/13 head CT. Bivalirudin was stopped and pt was off anticoagulation at that time. Her ECMO circuit was changed 8/24. Pharmacy asked to start low-fixed rate heparin. Heparin infusion was held last week due to bleeding from trach site.  Heparin was started at low fixed rate for pump patency in setting of ICH. Concern with increased  clotting off CRRT as well as circuit clots - s/p circuit exchange 9/17 so will start to titrate heparin to a low goal of 0.2-0.3  Heparin level remains low this evening at 0.16 (goal of 0.2-0.3). The systemic heparin is running at 1050 units/hr with a fixed rate of 500 units/hr running into CRRT circuit.    No complications noted with circuit - is having blood  tinged secretions when suctioning trach but stable from earlier. Hgb down to 9.5 on most recent check, plt stable 80s this morning.   Goal of Therapy:  HL 0.2-0.3 Monitor platelets by anticoagulation protocol: Yes   Plan:  Increase IV heparin to 1150 units/hr  Continue heparin level, CBC q 12 hrs.  Antonietta Jewel, PharmD, [place] Clinical Pharmacist  Phone: [phone] 02/27/2020 5:29 PM  Please check AMION for all [place] phone numbers After 10:00 PM, call [place] [phone]

## 2020-02-28 ENCOUNTER — Inpatient Hospital Stay (HOSPITAL_COMMUNITY): Payer: PRIVATE HEALTH INSURANCE

## 2020-02-28 LAB — POCT I-STAT 7, (LYTES, BLD GAS, ICA,H+H)
Acid-Base Excess: 1 mmol/L (ref 0.0–2.0)
Acid-Base Excess: 0 mmol/L (ref 0.0–2.0)
Acid-Base Excess: 3 mmol/L — ABNORMAL HIGH (ref 0.0–2.0)
Acid-Base Excess: 3 mmol/L — ABNORMAL HIGH (ref 0.0–2.0)
Bicarbonate: 27.4 mmol/L (ref 20.0–28.0)
Bicarbonate: 27.5 mmol/L (ref 20.0–28.0)
Bicarbonate: 29.4 mmol/L — ABNORMAL HIGH (ref 20.0–28.0)
Bicarbonate: 29.2 mmol/L — ABNORMAL HIGH (ref 20.0–28.0)
Calcium, Ion: 1.21 mmol/L (ref 1.15–1.40)
Calcium, Ion: 1.21 mmol/L (ref 1.15–1.40)
Calcium, Ion: 1.19 mmol/L (ref 1.15–1.40)
Calcium, Ion: 1.2 mmol/L (ref 1.15–1.40)
HCT: 27 % — ABNORMAL LOW (ref 36.0–46.0)
HCT: 27 % — ABNORMAL LOW (ref 36.0–46.0)
HCT: 27 % — ABNORMAL LOW (ref 36.0–46.0)
HCT: 25 % — ABNORMAL LOW (ref 36.0–46.0)
Hemoglobin: 9.2 g/dL — ABNORMAL LOW (ref 12.0–15.0)
Hemoglobin: 9.2 g/dL — ABNORMAL LOW (ref 12.0–15.0)
Hemoglobin: 9.2 g/dL — ABNORMAL LOW (ref 12.0–15.0)
Hemoglobin: 8.5 g/dL — ABNORMAL LOW (ref 12.0–15.0)
O2 Saturation: 93 %
O2 Saturation: 95 %
O2 Saturation: 92 %
O2 Saturation: 94 %
Patient temperature: 98.7
Patient temperature: 98.4
Patient temperature: 98.3
Patient temperature: 98.2
Potassium: 4.8 mmol/L (ref 3.5–5.1)
Potassium: 5 mmol/L (ref 3.5–5.1)
Potassium: 5.4 mmol/L — ABNORMAL HIGH (ref 3.5–5.1)
Potassium: 5.3 mmol/L — ABNORMAL HIGH (ref 3.5–5.1)
Sodium: 136 mmol/L (ref 135–145)
Sodium: 134 mmol/L — ABNORMAL LOW (ref 135–145)
Sodium: 134 mmol/L — ABNORMAL LOW (ref 135–145)
Sodium: 134 mmol/L — ABNORMAL LOW (ref 135–145)
TCO2: 29 mmol/L (ref 22–32)
TCO2: 29 mmol/L (ref 22–32)
TCO2: 31 mmol/L (ref 22–32)
TCO2: 31 mmol/L (ref 22–32)
pCO2 arterial: 51 mmHg — ABNORMAL HIGH (ref 32.0–48.0)
pCO2 arterial: 57 mmHg — ABNORMAL HIGH (ref 32.0–48.0)
pCO2 arterial: 55.1 mmHg — ABNORMAL HIGH (ref 32.0–48.0)
pCO2 arterial: 52.4 mmHg — ABNORMAL HIGH (ref 32.0–48.0)
pH, Arterial: 7.338 — ABNORMAL LOW (ref 7.350–7.450)
pH, Arterial: 7.291 — ABNORMAL LOW (ref 7.350–7.450)
pH, Arterial: 7.335 — ABNORMAL LOW (ref 7.350–7.450)
pH, Arterial: 7.354 (ref 7.350–7.450)
pO2, Arterial: 74 mmHg — ABNORMAL LOW (ref 83.0–108.0)
pO2, Arterial: 85 mmHg (ref 83.0–108.0)
pO2, Arterial: 67 mmHg — ABNORMAL LOW (ref 83.0–108.0)
pO2, Arterial: 75 mmHg — ABNORMAL LOW (ref 83.0–108.0)

## 2020-02-28 LAB — TYPE AND SCREEN
ABO/RH(D): O POS
Antibody Screen: NEGATIVE
Unit division: 0
Unit division: 0
Unit division: 0
Unit division: 0

## 2020-02-28 LAB — CBC WITH DIFFERENTIAL/PLATELET
Abs Immature Granulocytes: 0.11 10*3/uL — ABNORMAL HIGH (ref 0.00–0.07)
Basophils Absolute: 0.1 10*3/uL (ref 0.0–0.1)
Basophils Relative: 1 %
Eosinophils Absolute: 0.4 10*3/uL (ref 0.0–0.5)
Eosinophils Relative: 4 %
HCT: 28.9 % — ABNORMAL LOW (ref 36.0–46.0)
Hemoglobin: 8.8 g/dL — ABNORMAL LOW (ref 12.0–15.0)
Immature Granulocytes: 1 %
Lymphocytes Relative: 8 %
Lymphs Abs: 1 10*3/uL (ref 0.7–4.0)
MCH: 31.2 pg (ref 26.0–34.0)
MCHC: 30.4 g/dL (ref 30.0–36.0)
MCV: 102.5 fL — ABNORMAL HIGH (ref 80.0–100.0)
Monocytes Absolute: 0.8 10*3/uL (ref 0.1–1.0)
Monocytes Relative: 7 %
Neutro Abs: 9.5 10*3/uL — ABNORMAL HIGH (ref 1.7–7.7)
Neutrophils Relative %: 79 %
Platelets: 113 10*3/uL — ABNORMAL LOW (ref 150–400)
RBC: 2.82 MIL/uL — ABNORMAL LOW (ref 3.87–5.11)
RDW: 21.4 % — ABNORMAL HIGH (ref 11.5–15.5)
WBC: 12 10*3/uL — ABNORMAL HIGH (ref 4.0–10.5)
nRBC: 0.3 % — ABNORMAL HIGH (ref 0.0–0.2)

## 2020-02-28 LAB — RENAL FUNCTION PANEL
Albumin: 3 g/dL — ABNORMAL LOW (ref 3.5–5.0)
Anion gap: 9 (ref 5–15)
BUN: 37 mg/dL — ABNORMAL HIGH (ref 6–20)
CO2: 26 mmol/L (ref 22–32)
Calcium: 8.8 mg/dL — ABNORMAL LOW (ref 8.9–10.3)
Chloride: 99 mmol/L (ref 98–111)
Creatinine, Ser: 1.03 mg/dL — ABNORMAL HIGH (ref 0.44–1.00)
GFR calc Af Amer: >60 mL/min (ref >60)
GFR calc non Af Amer: >60 mL/min (ref >60)
Glucose, Bld: 235 mg/dL — ABNORMAL HIGH (ref 70–99)
Phosphorus: 4 mg/dL (ref 2.5–4.6)
Potassium: 5 mmol/L (ref 3.5–5.1)
Sodium: 134 mmol/L — ABNORMAL LOW (ref 135–145)

## 2020-02-28 LAB — LACTATE DEHYDROGENASE: LDH: 284 U/L — ABNORMAL HIGH (ref 98–192)

## 2020-02-28 LAB — BPAM RBC
Blood Product Expiration Date: 202110102359
Blood Product Expiration Date: 202110112359
Blood Product Expiration Date: 202110112359
Blood Product Expiration Date: 202110132359
ISSUE DATE / TIME: 202109160737
Unit Type and Rh: 5100
Unit Type and Rh: 5100
Unit Type and Rh: 5100
Unit Type and Rh: 5100

## 2020-02-28 LAB — CBC
HCT: 26.9 % — ABNORMAL LOW (ref 36.0–46.0)
Hemoglobin: 8.2 g/dL — ABNORMAL LOW (ref 12.0–15.0)
MCH: 31.3 pg (ref 26.0–34.0)
MCHC: 30.5 g/dL (ref 30.0–36.0)
MCV: 102.7 fL — ABNORMAL HIGH (ref 80.0–100.0)
Platelets: 96 10*3/uL — ABNORMAL LOW (ref 150–400)
RBC: 2.62 MIL/uL — ABNORMAL LOW (ref 3.87–5.11)
RDW: 22.1 % — ABNORMAL HIGH (ref 11.5–15.5)
WBC: 8.8 10*3/uL (ref 4.0–10.5)
nRBC: 0.7 % — ABNORMAL HIGH (ref 0.0–0.2)

## 2020-02-28 LAB — GLUCOSE, CAPILLARY
Glucose-Capillary: 167 mg/dL — ABNORMAL HIGH (ref 70–99)
Glucose-Capillary: 214 mg/dL — ABNORMAL HIGH (ref 70–99)
Glucose-Capillary: 215 mg/dL — ABNORMAL HIGH (ref 70–99)
Glucose-Capillary: 217 mg/dL — ABNORMAL HIGH (ref 70–99)
Glucose-Capillary: 222 mg/dL — ABNORMAL HIGH (ref 70–99)
Glucose-Capillary: 255 mg/dL — ABNORMAL HIGH (ref 70–99)

## 2020-02-28 LAB — VITAMIN C: Vitamin C: 1.8 mg/dL (ref 0.4–2.0)

## 2020-02-28 LAB — BASIC METABOLIC PANEL
Anion gap: 10 (ref 5–15)
BUN: 39 mg/dL — ABNORMAL HIGH (ref 6–20)
CO2: 25 mmol/L (ref 22–32)
Calcium: 9 mg/dL (ref 8.9–10.3)
Chloride: 101 mmol/L (ref 98–111)
Creatinine, Ser: 1.06 mg/dL — ABNORMAL HIGH (ref 0.44–1.00)
GFR calc Af Amer: >60 mL/min (ref >60)
GFR calc non Af Amer: 59 mL/min — ABNORMAL LOW (ref >60)
Glucose, Bld: 221 mg/dL — ABNORMAL HIGH (ref 70–99)
Potassium: 4.8 mmol/L (ref 3.5–5.1)
Sodium: 136 mmol/L (ref 135–145)

## 2020-02-28 LAB — HEPARIN LEVEL (UNFRACTIONATED)
Heparin Unfractionated: 0.3 IU/mL (ref 0.30–0.70)
Heparin Unfractionated: 0.21 IU/mL — ABNORMAL LOW (ref 0.30–0.70)

## 2020-02-28 LAB — FIBRINOGEN: Fibrinogen: 675 mg/dL — ABNORMAL HIGH (ref 210–475)

## 2020-02-28 LAB — LACTIC ACID, PLASMA
Lactic Acid, Venous: 1.5 mmol/L (ref 0.5–1.9)
Lactic Acid, Venous: 1.5 mmol/L (ref 0.5–1.9)

## 2020-02-28 LAB — MAGNESIUM: Magnesium: 2.6 mg/dL — ABNORMAL HIGH (ref 1.7–2.4)

## 2020-02-28 LAB — PHOSPHORUS: Phosphorus: 2.4 mg/dL — ABNORMAL LOW (ref 2.5–4.6)

## 2020-02-28 LAB — PREALBUMIN: Prealbumin: 17.2 mg/dL — ABNORMAL LOW (ref 18–38)

## 2020-02-28 MED ORDER — INSULIN ASPART 100 UNIT/ML ~~LOC~~ SOLN
15.0000 [IU] | SUBCUTANEOUS | Status: DC
  Administered 2020-02-28 – 2020-03-04 (×31): 15 [IU] via SUBCUTANEOUS

## 2020-02-28 MED ORDER — SODIUM PHOSPHATES 45 MMOLE/15ML IV SOLN
20.0000 mmol | Freq: Once | INTRAVENOUS | Status: AC
  Administered 2020-02-28: 20 mmol via INTRAVENOUS
  Filled 2020-02-28: qty 6.67

## 2020-02-28 MED ORDER — INSULIN DETEMIR 100 UNIT/ML ~~LOC~~ SOLN
50.0000 [IU] | Freq: Two times a day (BID) | SUBCUTANEOUS | Status: DC
  Administered 2020-02-28 – 2020-03-01 (×5): 50 [IU] via SUBCUTANEOUS
  Filled 2020-02-28 (×6): qty 0.5

## 2020-02-28 MED ORDER — MIDAZOLAM HCL 2 MG/2ML IJ SOLN
2.0000 mg | INTRAMUSCULAR | Status: DC | PRN
  Administered 2020-02-28: 4 mg via INTRAVENOUS
  Filled 2020-02-28: qty 4

## 2020-02-28 MED ORDER — SILVER NITRATE-POT NITRATE 75-25 % EX MISC
1.0000 "application " | CUTANEOUS | Status: DC | PRN
  Administered 2020-02-29: 5 via TOPICAL
  Filled 2020-02-28 (×2): qty 1

## 2020-02-28 NOTE — Plan of Care (Signed)
Problem: Education: Goal: Knowledge of General Education information will improve Description: Including pain rating scale, medication(s)/side effects and non-pharmacologic comfort measures Outcome: Progressing   Problem: Health Behavior/Discharge Planning: Goal: Ability to manage health-related needs will improve Outcome: Progressing   Problem: Clinical Measurements: Goal: Ability to maintain clinical measurements within normal limits will improve Outcome: Progressing Goal: Will remain free from infection Outcome: Progressing Goal: Diagnostic test results will improve Outcome: Progressing Goal: Respiratory complications will improve Outcome: Progressing Goal: Cardiovascular complication will be avoided Outcome: Progressing   Problem: Activity: Goal: Risk for activity intolerance will decrease Outcome: Progressing   Problem: Nutrition: Goal: Adequate nutrition will be maintained Outcome: Progressing   Problem: Coping: Goal: Level of anxiety will decrease Outcome: Progressing   Problem: Elimination: Goal: Will not experience complications related to bowel motility Outcome: Progressing Goal: Will not experience complications related to urinary retention Outcome: Progressing   Problem: Pain Managment: Goal: General experience of comfort will improve Outcome: Progressing   Problem: Safety: Goal: Ability to remain free from injury will improve Outcome: Progressing   Problem: Skin Integrity: Goal: Risk for impaired skin integrity will decrease Outcome: Progressing   Problem: Education: Goal: Knowledge of risk factors and measures for prevention of condition will improve Outcome: Progressing   Problem: Coping: Goal: Psychosocial and spiritual needs will be supported Outcome: Progressing   Problem: Respiratory: Goal: Will maintain a patent airway Outcome: Progressing Goal: Complications related to the disease process, condition or treatment will be avoided or  minimized Outcome: Progressing   Problem: Education: Goal: Knowledge of risk factors and measures for prevention of condition will improve Outcome: Progressing   Problem: Coping: Goal: Psychosocial and spiritual needs will be supported Outcome: Progressing   Problem: Respiratory: Goal: Will maintain a patent airway Outcome: Progressing Goal: Complications related to the disease process, condition or treatment will be avoided or minimized Outcome: Progressing   Problem: Education: Goal: Ability to describe self-care measures that may prevent or decrease complications (Diabetes Survival Skills Education) will improve Outcome: Progressing Goal: Individualized Educational Video(s) Outcome: Progressing   Problem: Coping: Goal: Ability to adjust to condition or change in health will improve Outcome: Progressing   Problem: Fluid Volume: Goal: Ability to maintain a balanced intake and output will improve Outcome: Progressing   Problem: Health Behavior/Discharge Planning: Goal: Ability to identify and utilize available resources and services will improve Outcome: Progressing Goal: Ability to manage health-related needs will improve Outcome: Progressing   Problem: Metabolic: Goal: Ability to maintain appropriate glucose levels will improve Outcome: Progressing   Problem: Nutritional: Goal: Maintenance of adequate nutrition will improve Outcome: Progressing Goal: Progress toward achieving an optimal weight will improve Outcome: Progressing   Problem: Skin Integrity: Goal: Risk for impaired skin integrity will decrease Outcome: Progressing   Problem: Tissue Perfusion: Goal: Adequacy of tissue perfusion will improve Outcome: Progressing   Problem: Education: Goal: Knowledge of risk factors and measures for prevention of condition will improve Outcome: Progressing   Problem: Coping: Goal: Psychosocial and spiritual needs will be supported Outcome: Progressing    Problem: Respiratory: Goal: Will maintain a patent airway Outcome: Progressing Goal: Complications related to the disease process, condition or treatment will be avoided or minimized Outcome: Progressing   Problem: Education: Goal: Knowledge of disease or condition will improve Outcome: Progressing Goal: Knowledge of secondary prevention will improve Outcome: Progressing Goal: Knowledge of patient specific risk factors addressed and post discharge goals established will improve Outcome: Progressing Goal: Individualized Educational Video(s) Outcome: Progressing   Problem: Coping: Goal: Will verbalize  positive feelings about self Outcome: Progressing Goal: Will identify appropriate support needs Outcome: Progressing   Problem: Health Behavior/Discharge Planning: Goal: Ability to manage health-related needs will improve Outcome: Progressing   Problem: Self-Care: Goal: Ability to participate in self-care as condition permits will improve Outcome: Progressing Goal: Verbalization of feelings and concerns over difficulty with self-care will improve Outcome: Progressing Goal: Ability to communicate needs accurately will improve Outcome: Progressing   Problem: Nutrition: Goal: Risk of aspiration will decrease Outcome: Progressing Goal: Dietary intake will improve Outcome: Progressing   Problem: Intracerebral Hemorrhage Tissue Perfusion: Goal: Complications of Intracerebral Hemorrhage will be minimized Outcome: Progressing   Problem: Ischemic Stroke/TIA Tissue Perfusion: Goal: Complications of ischemic stroke/TIA will be minimized Outcome: Progressing   Problem: Spontaneous Subarachnoid Hemorrhage Tissue Perfusion: Goal: Complications of Spontaneous Subarachnoid Hemorrhage will be minimized Outcome: Progressing

## 2020-02-28 NOTE — Progress Notes (Signed)
  Speech Language Pathology Treatment: Nada Boozer Speaking valve  Patient Details Name: Alisha Stephenson MRN: [id number] DOB: 04/13/1966 Today's Date: 02/28/2020 Time: 8768-1157 SLP Time Calculation (min) (ACUTE ONLY): 20 min  Assessment / Plan / Recommendation Clinical Impression  Pt listless today, requiring max prompts to engage and follow directions.  She was seen for inline PMV in collaboration with RT. RT adjusted settings, changing mode to PS from [facility] and decreasing PEEP/ increasing pressure.  Cuff deflated and valve placed.  Pt required max verbal cues to increase volume and execute slower, more controlled exhalation during non-speech and speech tasks. With EMST device in place, she achieved exhalation against resistance on one occasion, then could not follow commands for further trials.  Tidal volumes were improved, fluctuating between 304 303 0686 ml.  Speech was very low in volume today with minimal adduction.  Session was ended due to pt's fatigue and difficulty with participation.  D/W pt's husband and RN - SLP will continue efforts.   HPI HPI: 54 y/o female admitted on 8/2 with severe acute respiratory failure with hypoxemia due to COVID 19 pneumonia.  She developed symptoms 1 week prior to admission. Intubated 8/3, ECMO started 8/9, ICH on 8/13 Ct reads Multifocal parenchymal hemorrhages, with the largest within the right parietal lobe. Trach placed 8/30, PEG on 9/10. MD writes on 9/13 "ultra lung protective ventilation.  No trach collar trials as this may delay ECMO weaning trials.  Okay to try Passy-Muir trials on the vent."      SLP Plan  Continue with current plan of care       Recommendations         Patient may use Passy-Muir Speech Valve: with SLP only         Oral Care Recommendations: Oral care QID Plan: Continue with current plan of care       GO               Ricci Paff L. Tivis Ringer, [place] CCC/SLP Acute Rehabilitation Services Office number [phone] Pager  [phone]  Alisha Stephenson 02/28/2020, 10:50 AM

## 2020-02-28 NOTE — Progress Notes (Signed)
Physical Therapy Wound Treatment Patient Details  Name: Alisha Stephenson MRN: [id number] Date of Birth: Oct 23, 1965  Today's Date: 02/28/2020 Time: 1130-1230 Time Calculation (min): 60 min  Subjective  Subjective: Pt nonverbal however mouthing "ok" and "thank you" Patient and Family Stated Goals: Heal wound - per husband Date of Onset:  (Unknown)  Pain Score:  Pt was premedicated and given additional pain meds during session when uncomfortable. Tolerated well.  Wound Assessment  Pressure Injury 02/21/20 Sacrum Posterior Unstageable - Full thickness tissue loss in which the base of the injury is covered by slough (yellow, tan, gray, green or brown) and/or eschar (tan, brown or black) in the wound bed. Unstageable Sacral Wound (Active)  Wound Image   02/28/20 1358  Dressing Type ABD;Barrier Film (skin prep);Gauze (Comment);Moist to dry 02/28/20 1358  Dressing Changed;Clean;Dry;Intact 02/28/20 1358  Dressing Change Frequency Daily 02/28/20 1358  State of Healing Eschar 02/28/20 1358  Site / Wound Assessment Black;Red 02/28/20 1358  % Wound base Red or Granulating 20% 02/28/20 1358  % Wound base Yellow/Fibrinous Exudate 0% 02/28/20 1358  % Wound base Black/Eschar 80% 02/28/20 1358  % Wound base Other/Granulation Tissue (Comment) 0% 02/28/20 1358  Peri-wound Assessment Intact;Erythema (blanchable) 02/28/20 1358  Wound Length (cm) 7.5 cm 02/28/20 1358  Wound Width (cm) 7.5 cm 02/28/20 1358  Wound Depth (cm) 0.2 cm 02/28/20 1358  Wound Surface Area (cm^2) 56.25 cm^2 02/28/20 1358  Wound Volume (cm^3) 11.25 cm^3 02/28/20 1358  Margins Unattached edges (unapproximated) 02/28/20 1358  Drainage Amount Moderate 02/28/20 1358  Drainage Description Serosanguineous 02/28/20 1358  Treatment Debridement (Selective);Hydrotherapy (Pulse lavage);Packing (Saline gauze) 02/28/20 1358   Santyl applied to wound bed prior to applying dressing.     Hydrotherapy Pulsed lavage therapy - wound location:  Sacrum Pulsed Lavage with Suction (psi): 12 psi Pulsed Lavage with Suction - Normal Saline Used: 1000 mL Pulsed Lavage Tip: Tip with splash shield Selective Debridement Selective Debridement - Location: Sacrum Selective Debridement - Tools Used: Forceps;Scalpel Selective Debridement - Tissue Removed: Eschar and non-viable tissue   Wound Assessment and Plan  Wound Therapy - Assess/Plan/Recommendations Wound Therapy - Clinical Statement: Pt presents to hydrotherapy with an unstagable sacral pressure injury. We were able to initiate debridement this session and pt tolerated well with RN and ECMO specialist present throughout. This patient will benefit from continued hydrotherapy for selective removal of unviable tissue, to decrease bioburden and promote wound bed healing.  Wound Therapy - Functional Problem List: Decreased generaly mobility, on ECMO, CRRT, vent Factors Delaying/Impairing Wound Healing: Immobility;Multiple medical problems Hydrotherapy Plan: Debridement;Dressing change;Patient/family education;Pulsatile lavage with suction Wound Therapy - Frequency: 6X / week Wound Therapy - Follow Up Recommendations: Skilled nursing facility (vs LTACH) Wound Plan: See above  Wound Therapy Goals- Improve the function of patient's integumentary system by progressing the wound(s) through the phases of wound healing (inflammation - proliferation - remodeling) by: Decrease Necrotic Tissue to: 20% Decrease Necrotic Tissue - Progress: Goal set today Increase Granulation Tissue to: 80% Increase Granulation Tissue - Progress: Goal set today Goals/treatment plan/discharge plan were made with and agreed upon by patient/family: Yes Time For Goal Achievement: 7 days Wound Therapy - Potential for Goals: Fair  Goals will be updated until maximal potential achieved or discharge criteria met.  Discharge criteria: when goals achieved, discharge from hospital, MD decision/surgical intervention, no progress  towards goals, refusal/missing three consecutive treatments without notification or medical reason.  GP     Thelma Comp 02/28/2020, 3:08 PM   Rolinda Roan, PT,  DPT Acute Rehabilitation Services Pager: [phone] Office: [phone]

## 2020-02-28 NOTE — Progress Notes (Signed)
Patient ID: Alisha Stephenson, female   DOB: 1965/06/13, 54 y.o.   MRN: [id number]    Advanced Heart Failure Rounding Note   Subjective:    - 8/2 COVID + test - 8/9 Cannulated for VV ECMO - 8/13 with several areas of intracranial hemorrhage. Bival stopped.  - 8/14 CT no change in Ronald. Increased edema - 8/16 Extubated - 8/16 Head CT stable bleed - 8/22 CVVHD started - 8/24 reintubated - 8/24 circuit changed - 8/30 trach - 9/7  Switched to iHD - 02/15/20 Switched back to CVVHD - 9/10 GJ tube placed - 9/17 Circuit change  Remains awake on vent via trach. Communicative. Following commands. Remains weak.  Remains on CVVHD. Pulling -25 to -50. Weight stable  Off pressors. Sweep weaned to 5.5  CXR stil with diffuse infiltrates R>L  Seen by GSU for large sacral ulcer. Starting hydrotherapy  ECMO  Flow 5.1 L RPM 4100 Sweep5.5  Labs: 7.34/51/74/93% 40% FiO2 TV 350cc Hgb8.2 PLT 93k -> 89k LDH 284 Lactic acid1.5 Heparin 0.30  Objective:   Weight Range:  Vital Signs:   Temp:  [97.7 F (36.5 C)-98 F (36.7 C)] 97.9 F (36.6 C) (09/22 0410) Pulse Rate:  [93-109] 100 (09/22 0700) Resp:  [21-29] 29 (09/22 0810) BP: (80-119)/(46-62) 108/58 (09/22 0600) SpO2:  [87 %-100 %] 89 % (09/22 0810) Arterial Line BP: (89-119)/(45-58) 117/55 (09/22 0700) FiO2 (%):  [40 %] 40 % (09/22 0810) Weight:  [114.7 kg] 114.7 kg (09/22 0500) Last BM Date: 02/26/20  Weight change: Filed Weights   02/26/20 0500 02/27/20 0500 02/28/20 0500  Weight: 114 kg 115.1 kg 114.7 kg    Intake/Output:   Intake/Output Summary (Last 24 hours) at 02/28/2020 0818 Last data filed at 02/28/2020 0700 Gross per 24 hour  Intake 2015.09 ml  Output 2634 ml  Net -618.91 ml     Physical Exam: General:  Sitting up in bed awake following commands HEENT: normal Neck: supple.RIJ ECMO cannula. + trach +LIJ trialysis Carotids 2+ bilat; no bruits. No lymphadenopathy or thryomegaly appreciated. Cor: PMI  nondisplaced. Regular rate & rhythm. No rubs, gallops or murmurs. Lungs: decreased BS Abdomen: obese  soft, nontender, nondistended. +PEG Extremities: no cyanosis, clubbing, rash, trace edema necrotic fingertips/  Neuro: alert & orientedx3, weakAffect pleasant Large unstageable sacral wound   Telemetry: Sinus 90-105 Personally reviewed   Labs: Basic Metabolic Panel: Recent Labs  Lab 02/24/20 0437 02/24/20 0452 02/25/20 0500 02/25/20 0802 02/25/20 1656 02/25/20 1711 02/26/20 0417 02/26/20 0821 02/26/20 1648 02/26/20 1808 02/27/20 0403 02/27/20 0418 02/27/20 1643 02/27/20 2003 02/27/20 2316 02/28/20 0435 02/28/20 0446  NA 135   < > 136   < > 136   < > 135   < > 135   < > 134*   < > 134* 135 135 136 136  K 4.7   < > 4.5   < > 4.8   < > 4.5   < > 5.0   < > 4.8   < > 5.1 4.9 4.7 4.8 4.8  CL 101   < > 103   < > 101   < > 100  --  101  --  101  --  98  --   --  101  --   CO2 24   < > 24   < > 25   < > 25  --  26  --  25  --  24  --   --  25  --  GLUCOSE 217*   < > 226*   < > 237*   < > 290*  --  216*  --  208*  --  271*  --   --  221*  --   BUN 37*   < > 37*   < > 35*   < > 36*  --  31*  --  34*  --  37*  --   --  39*  --   CREATININE 1.01*   < > 0.99   < > 0.97   < > 0.95  --  0.88  --  1.01*  --  1.02*  --   --  1.06*  --   CALCIUM 9.2   < > 8.8*   < > 8.9   < > 8.9  --  9.0   < > 8.8*  --  8.7*  --   --  9.0  --   MG 3.0*  --  2.8*  --   --   --  2.7*  --   --   --  2.7*  --   --   --   --  2.6*  --   PHOS 2.9   < > 2.4*   < > 3.0  --  2.6  --   --   --  1.9*  --  3.5  --   --  2.4*  --    < > = values in this interval not displayed.    Liver Function Tests: Recent Labs  Lab 02/25/20 0500 02/25/20 1656 02/26/20 0417 02/27/20 0403 02/27/20 1643  ALBUMIN 3.2* 3.4* 3.4* 3.2* 3.2*   No results for input(s): LIPASE, AMYLASE in the last 168 hours. No results for input(s): AMMONIA in the last 168 hours.  CBC: Recent Labs  Lab 02/25/20 1656 02/25/20 1711  02/26/20 0416 02/26/20 0821 02/26/20 1648 02/26/20 1808 02/27/20 0403 02/27/20 0418 02/27/20 1601 02/27/20 2003 02/27/20 2316 02/28/20 0435 02/28/20 0446  WBC 9.0  --  8.0  --  8.5  --  6.7  --   --   --   --  8.8  --   HGB 9.3*   < > 8.6*   < > 8.7*   < > 8.1*   < > 9.5* 9.2* 8.5* 8.2* 9.2*  HCT 30.9*   < > 28.3*   < > 29.7*   < > 26.6*   < > 28.0* 27.0* 25.0* 26.9* 27.0*  MCV 102.3*  --  101.8*  --  104.2*  --  102.7*  --   --   --   --  102.7*  --   PLT 99*  --  88*  --  91*  --  89*  --   --   --   --  96*  --    < > = values in this interval not displayed.    Cardiac Enzymes: No results for input(s): CKTOTAL, CKMB, CKMBINDEX, TROPONINI in the last 168 hours.  BNP: BNP (last 3 results) No results for input(s): BNP in the last 8760 hours.  ProBNP (last 3 results) No results for input(s): PROBNP in the last 8760 hours.    Other results:  Imaging: DG CHEST PORT 1 VIEW  Result Date: 02/28/2020 CLINICAL DATA:  Trach, ARDS, ECMO EXAM: PORTABLE CHEST 1 VIEW COMPARISON:  02/27/2020 FINDINGS: Tracheostomy, left dialysis catheter and ECMO device remain in place, unchanged. Diffuse bilateral airspace disease. Probable loculated right pleural effusion. Mild cardiomegaly. Right PICC  line remains in place. No significant change since prior study. IMPRESSION: No interval change. Electronically Signed   By: Rolm Baptise M.D.   On: 02/28/2020 08:12   DG CHEST PORT 1 VIEW  Result Date: 02/27/2020 CLINICAL DATA:  Tracheostomy.  ECMO. EXAM: PORTABLE CHEST 1 VIEW COMPARISON:  02/26/2020. FINDINGS: Tracheostomy tube, left IJ line, right PICC line in stable position. ECMO device in unchanged position. Heart size stable. Diffuse severe bilateral pulmonary infiltrates again noted without interim change. Small right pleural effusion cannot be excluded. No pneumothorax. Prior cervical spine fusion. IMPRESSION: 1.  Lines and tubes in unchanged position. 2. Diffuse severe bilateral pulmonary  infiltrates again noted without interim change. Small right pleural effusion cannot be excluded. Electronically Signed   By: Marcello Moores  Register   On: 02/27/2020 06:57     Medications:     Scheduled Medications: . amiodarone  200 mg Per Tube Daily  . vitamin C  500 mg Per Tube Daily  . chlorhexidine gluconate (MEDLINE KIT)  15 mL Mouth Rinse BID  . Chlorhexidine Gluconate Cloth  6 each Topical Q0600  . clonazePAM  1 mg Per Tube Q6H  . collagenase   Topical Daily  . docusate  100 mg Per Tube BID  . feeding supplement (PROSource TF)  45 mL Per Tube QID  . Gerhardt's butt cream   Topical BID  . insulin aspart  0-15 Units Subcutaneous Q4H  . insulin aspart  15 Units Subcutaneous Q4H  . insulin detemir  50 Units Subcutaneous BID  . linagliptin  5 mg Per Tube Daily  . mouth rinse  15 mL Mouth Rinse 10 times per day  . melatonin  9 mg Per Tube QHS  . metoCLOPramide (REGLAN) injection  5 mg Intravenous Q8H  . midodrine  10 mg Per Tube TID  . multivitamin  1 tablet Per Tube QHS  . oxyCODONE  10 mg Per Tube Q6H  . pantoprazole (PROTONIX) IV  40 mg Intravenous BID  . pneumococcal 23 valent vaccine  0.5 mL Intramuscular Tomorrow-1000  . polyethylene glycol  17 g Per Tube Daily  . QUEtiapine  100 mg Per Tube QHS  . sennosides  5 mL Per Tube BID  . zinc sulfate  220 mg Per Tube Daily    Infusions: .  prismasol BGK 4/2.5 400 mL/hr at 02/27/20 2322  .  prismasol BGK 4/2.5 200 mL/hr at 02/27/20 2308  . sodium chloride 500 mL (02/11/20 2215)  . sodium chloride Stopped (02/25/20 0739)  . sodium chloride Stopped (02/24/20 2312)  . sodium chloride    . albumin human 12.5 g (02/26/20 0318)  . albumin human Stopped (02/06/20 2000)  . feeding supplement (PIVOT 1.5 CAL) 65 mL/hr at 02/28/20 0700  . heparin 10,000 units/ 20 mL infusion syringe 500 Units/hr (02/27/20 2348)  . heparin 1,150 Units/hr (02/28/20 0700)  . prismasol BGK 4/2.5 1,500 mL/hr at 02/28/20 0602    PRN Medications: sodium  chloride, sodium chloride, sodium chloride, sodium chloride, acetaminophen (TYLENOL) oral liquid 160 mg/5 mL, albumin human, albuterol, alteplase, alum & mag hydroxide-simeth, dextrose, fentaNYL (SUBLIMAZE) injection, guaiFENesin-dextromethorphan, heparin, heparin, heparin, hydrALAZINE, HYDROmorphone (DILAUDID) injection, hydrOXYzine, lidocaine (PF), lidocaine-prilocaine, midazolam, ondansetron **OR** ondansetron (ZOFRAN) IV, pentafluoroprop-tetrafluoroeth, Zinc Oxide   Assessment/Plan:   1. Acute hypoxic/hypercapneic respiratory failure in setting of severe COVID PNA/ARDS -> VV ECMO - admit 8/2 - intubation 8/3 - has received actmera (compelted 8/2), remdesivir (completed 8/6) and steroids - Cannulated for VV ECMO on 8/9 - Extubated 8/16. Reintubated 8/24 -  s/p trach 8/30. - Circuit changed 8/24 due to concern for hemolysis and worsening oxygenation. Repeat circuit change 9/17 - CVVHD started 8/22 for volume removal and uremia. Remains anuric. CXR worse on 9/9 so switched back to CVVHD. Now keeping at -25-50/hr. D/w Renal. No change.  - Off IV sedation. Continue melatonin and seroquel for sleep at night. Adjusted to help with restlessness and sundowning at night - Stable off pressors. On midodrine 10 tid. Has wide pulse pressure but no AI on echo.  - Lung function returning very slowly. I suspect major issue is respiratory muscle weakness and poor lung compliance but still with diffuse infiltrates. Continue to wean sweep. Now down to 5.5  Suspect baseline pCO2 is in 50s. Ok to let it drift up a bit. Continue to keep her mildly acidotic and encourage respiratory muscle training. Continue sweep wean today as tolerated. CCM to u/s lung today. - Continue heparin with level 0.2-0.3 No bleeding Discussed dosing with PharmD personally.  2. Enterococcus sepsis - Finished high-dose zosyn on 9/7 per ID - Eraxis added on 8/26 with yeast in BAL 8/24 (ended 9/2)  - F/u cx 9/7 No growth  3. Intracranial  hemorrhage - ? Septic emboli - repeat head CT on 8/14 with stable bleeds but increased edema - neurology has seen. Suspect significant long-term injury sustained. Will follow commands. Appears to have dense LUE weakness and possibly LLE - repeat head CT stable 8/16 - tolerating heparin. No change  4. Thrombocytopenia - PLTs 93k  -> 89K. -> 96k - Continue to follow. Watch circuit. (had recent change) - Stable   5. Morbid obesity - Body mass index is Body mass index is 43.4 kg/m.  6. Poorly controlled DM2 - HgBA1c 10.7 - IV insulin converted to SQ insulin on 9/15  7. PAF - intermittent episodes. Last 8/26 - Now on po. Quiescent  8. AKI/azotemia - CVVHD started on 8/22 - Down 70 pounds.  - transitioned to [facility] on 9/7 - switched back to CVVHD for better volume removal on 9/9. - new catheter placed 9/9 - will eventually need tunneled access - Keep UF rate -25 to - 50 as above. Weight stable.  - D/w Renal at bedside  9. Emesis - Has GJ tube now, tolerating TFs  10. Sacral decub  - large unstageable. Seen by GSU. Not candidate for debridement at this point.  - for hydrotherapy  11. Necrotic fingers - VVS following - suspect will auto amputate  12. Anemia - Transfuse hgb < 8   CRITICAL CARE Performed by: Glori Bickers  Total critical care time: 35 minutes  Critical care time was exclusive of separately billable procedures and treating other patients.  Critical care was necessary to treat or prevent imminent or life-threatening deterioration.  Critical care was time spent personally by me (independent of midlevel providers or residents) on the following activities: development of treatment plan with patient and/or surrogate as well as nursing, discussions with consultants, evaluation of patient's response to treatment, examination of patient, obtaining history from patient or surrogate, ordering and performing treatments and interventions, ordering and review of  laboratory studies, ordering and review of radiographic studies, pulse oximetry and re-evaluation of patient's condition.   Length of Stay: 39   Glori Bickers  MD 02/28/2020, 8:18 AM  Advanced Heart Failure Team Pager [phone] (M-F; [place])  Please contact [facility] for night-coverage after hours (4p -7a ) and weekends on [email or web address]

## 2020-02-28 NOTE — Progress Notes (Signed)
Patient ID: Alisha Stephenson, female   DOB: 1965-09-11, 54 y.o.   MRN: [id number] S: No events overnight O:BP (!) 108/58   Pulse 100   Temp 97.9 F (36.6 C) (Oral)   Resp (!) 29   Ht 5' 4"  (1.626 m)   Wt 114.7 kg   SpO2 (!) 89%   BMI 43.40 kg/m   Intake/Output Summary (Last 24 hours) at 02/28/2020 0838 Last data filed at 02/28/2020 0700 Gross per 24 hour  Intake 2015.09 ml  Output 2634 ml  Net -618.91 ml   Intake/Output: I/O last 3 completed shifts: In: 2996.8 [I.V.:394.4; NG/GT:2340; IV Piggyback:262.4] Out: 0160 [Drains:100; Other:3989]  Intake/Output this shift:  No intake/output data recorded. Weight change: -0.4 kg Gen: on vent via trach CVS: tachy Resp: mechanically ventilated BS bilaterally Abd: +BS, soft Ext: no edema  Recent Labs  Lab 02/24/20 0437 02/24/20 0452 02/24/20 1724 02/24/20 1734 02/25/20 0500 02/25/20 0802 02/25/20 1656 02/25/20 1711 02/26/20 0417 02/26/20 0821 02/26/20 1648 02/26/20 1808 02/27/20 0403 02/27/20 0418 02/27/20 1142 02/27/20 1601 02/27/20 1643 02/27/20 2003 02/27/20 2316 02/28/20 0435 02/28/20 0446  NA 135   < > 135   < > 136   < > 136   < > 135   < > 135   < > 134*   < > 135 135 134* 135 135 136 136  K 4.7   < > 4.9   < > 4.5   < > 4.8   < > 4.5   < > 5.0   < > 4.8   < > 5.0 5.2* 5.1 4.9 4.7 4.8 4.8  CL 101   < > 101   < > 103  --  101  --  100  --  101  --  101  --   --   --  98  --   --  101  --   CO2 24   < > 24   < > 24  --  25  --  25  --  26  --  25  --   --   --  24  --   --  25  --   GLUCOSE 217*   < > 249*   < > 226*  --  237*  --  290*  --  216*  --  208*  --   --   --  271*  --   --  221*  --   BUN 37*   < > 38*   < > 37*  --  35*  --  36*  --  31*  --  34*  --   --   --  37*  --   --  39*  --   CREATININE 1.01*   < > 1.09*   < > 0.99  --  0.97  --  0.95  --  0.88  --  1.01*  --   --   --  1.02*  --   --  1.06*  --   ALBUMIN 3.2*  --  3.2*  --  3.2*  --  3.4*  --  3.4*  --   --   --  3.2*  --   --   --  3.2*  --    --   --   --   CALCIUM 9.2   < > 9.0   < > 8.8*  --  8.9  --  8.9  --  9.0  --  8.8*  --   --   --  8.7*  --   --  9.0  --   PHOS 2.9   < > 2.7  --  2.4*  --  3.0  --  2.6  --   --   --  1.9*  --   --   --  3.5  --   --  2.4*  --    < > = values in this interval not displayed.   Liver Function Tests: Recent Labs  Lab 02/26/20 0417 02/27/20 0403 02/27/20 1643  ALBUMIN 3.4* 3.2* 3.2*   No results for input(s): LIPASE, AMYLASE in the last 168 hours. No results for input(s): AMMONIA in the last 168 hours. CBC: Recent Labs  Lab 02/25/20 1656 02/25/20 1711 02/26/20 0416 02/26/20 0821 02/26/20 1648 02/26/20 1808 02/27/20 0403 02/27/20 0418 02/27/20 2316 02/28/20 0435 02/28/20 0446  WBC 9.0   < > 8.0  --  8.5  --  6.7  --   --  8.8  --   HGB 9.3*   < > 8.6*   < > 8.7*   < > 8.1*   < > 8.5* 8.2* 9.2*  HCT 30.9*   < > 28.3*   < > 29.7*   < > 26.6*   < > 25.0* 26.9* 27.0*  MCV 102.3*  --  101.8*  --  104.2*  --  102.7*  --   --  102.7*  --   PLT 99*   < > 88*  --  91*  --  89*  --   --  96*  --    < > = values in this interval not displayed.   Cardiac Enzymes: No results for input(s): CKTOTAL, CKMB, CKMBINDEX, TROPONINI in the last 168 hours. CBG: Recent Labs  Lab 02/27/20 1123 02/27/20 1559 02/27/20 2001 02/27/20 2314 02/28/20 0444  GLUCAP 285* 252* 232* 203* 215*    Iron Studies: No results for input(s): IRON, TIBC, TRANSFERRIN, FERRITIN in the last 72 hours. Studies/Results: DG CHEST PORT 1 VIEW  Result Date: 02/28/2020 CLINICAL DATA:  Trach, ARDS, ECMO EXAM: PORTABLE CHEST 1 VIEW COMPARISON:  02/27/2020 FINDINGS: Tracheostomy, left dialysis catheter and ECMO device remain in place, unchanged. Diffuse bilateral airspace disease. Probable loculated right pleural effusion. Mild cardiomegaly. Right PICC line remains in place. No significant change since prior study. IMPRESSION: No interval change. Electronically Signed   By: Rolm Baptise M.D.   On: 02/28/2020 08:12   DG  CHEST PORT 1 VIEW  Result Date: 02/27/2020 CLINICAL DATA:  Tracheostomy.  ECMO. EXAM: PORTABLE CHEST 1 VIEW COMPARISON:  02/26/2020. FINDINGS: Tracheostomy tube, left IJ line, right PICC line in stable position. ECMO device in unchanged position. Heart size stable. Diffuse severe bilateral pulmonary infiltrates again noted without interim change. Small right pleural effusion cannot be excluded. No pneumothorax. Prior cervical spine fusion. IMPRESSION: 1.  Lines and tubes in unchanged position. 2. Diffuse severe bilateral pulmonary infiltrates again noted without interim change. Small right pleural effusion cannot be excluded. Electronically Signed   By: Marcello Moores  Register   On: 02/27/2020 06:57   . amiodarone  200 mg Per Tube Daily  . vitamin C  500 mg Per Tube Daily  . chlorhexidine gluconate (MEDLINE KIT)  15 mL Mouth Rinse BID  . Chlorhexidine Gluconate Cloth  6 each Topical Q0600  . clonazePAM  1 mg Per Tube Q6H  . collagenase   Topical Daily  . docusate  100 mg Per Tube BID  .  feeding supplement (PROSource TF)  45 mL Per Tube QID  . Gerhardt's butt cream   Topical BID  . insulin aspart  0-15 Units Subcutaneous Q4H  . insulin aspart  15 Units Subcutaneous Q4H  . insulin detemir  50 Units Subcutaneous BID  . linagliptin  5 mg Per Tube Daily  . mouth rinse  15 mL Mouth Rinse 10 times per day  . melatonin  9 mg Per Tube QHS  . metoCLOPramide (REGLAN) injection  5 mg Intravenous Q8H  . midodrine  10 mg Per Tube TID  . multivitamin  1 tablet Per Tube QHS  . oxyCODONE  10 mg Per Tube Q6H  . pantoprazole (PROTONIX) IV  40 mg Intravenous BID  . pneumococcal 23 valent vaccine  0.5 mL Intramuscular Tomorrow-1000  . polyethylene glycol  17 g Per Tube Daily  . QUEtiapine  100 mg Per Tube QHS  . sennosides  5 mL Per Tube BID  . zinc sulfate  220 mg Per Tube Daily    BMET    Component Value Date/Time   NA 136 02/28/2020 0446   K 4.8 02/28/2020 0446   CL 101 02/28/2020 0435   CO2 25  02/28/2020 0435   GLUCOSE 221 (H) 02/28/2020 0435   BUN 39 (H) 02/28/2020 0435   CREATININE 1.06 (H) 02/28/2020 0435   CALCIUM 9.0 02/28/2020 0435   GFRNONAA 59 (L) 02/28/2020 0435   GFRAA >60 02/28/2020 0435   CBC    Component Value Date/Time   WBC 8.8 02/28/2020 0435   RBC 2.62 (L) 02/28/2020 0435   HGB 9.2 (L) 02/28/2020 0446   HCT 27.0 (L) 02/28/2020 0446   PLT 96 (L) 02/28/2020 0435   MCV 102.7 (H) 02/28/2020 0435   MCH 31.3 02/28/2020 0435   MCHC 30.5 02/28/2020 0435   RDW 22.1 (H) 02/28/2020 0435   LYMPHSABS 1.7 02/07/2020 0311   MONOABS 0.7 02/07/2020 0311   EOSABS 0.8 (H) 02/07/2020 0311   BASOSABS 0.1 02/07/2020 0311   Assessment/Plan:  1. Anuric AKI in setting of ischemic ATN related to septic shock from COVD-19 PNA requiring VV ECMO. Started on CRRT 8/22-02/12/20 and transitioned to IHD on 02/13/20 but back to CRRT on 02/15/20 due to volume overload and large IVF intake.  1. Tolerating CRRT and tolerating UF. 2. No changes to CRRT today. 2. Vascular access- L IJ trialysis catheter placed 02/15/20.  Will need new access this week.  Will discuss with PCCM 3. Hypophosphatemia- due to CRRT- will re-order replacement phos and follow. 4. Shock due to COVID-19 infection diagnosed 01/14/2020 as well as enterococcal bacteremia- completed zosyn 02/13/20, negative TTE. Off of pressors. 5. Acute hypoxic/hypercapneic respiratory failure in setting of severe covid PNA/ARDS- now on VV ECMO per PCCM/CHF. Lung function slowly returning. UF as bp tolerates. 6. ICH- stable by CT scan 8/14. Likely will have long-term deficits. 7. Thrombocytopenia- stable 8. Morbid obesity 9. DM type 2 10. P. Atrial fibrillation  Donetta Potts, MD [facility] [phone]

## 2020-02-28 NOTE — Progress Notes (Signed)
[place] for Heparin Indication: ECMO  Allergies  Allergen Reactions  . Sulfa Antibiotics Rash and Other (See Comments)    Patient Measurements: Height: 5\' 4"  (162.6 cm) Weight: 114.7 kg (252 lb 13.9 oz) IBW/kg (Calculated) : 54.7 Heparin Dosing Weight: 87 kg  Vital Signs: Temp: 98.4 F (36.9 C) (09/22 1600) Temp Source: Oral (09/22 1600) BP: 97/53 (09/22 1700) Pulse Rate: 99 (09/22 1700)  Labs: Recent Labs    02/26/20 1648 02/26/20 1808 02/27/20 0403 02/27/20 0418 02/27/20 1643 02/27/20 2003 02/28/20 0435 02/28/20 0435 02/28/20 0436 02/28/20 0446 02/28/20 1618 02/28/20 1626  HGB 8.7*   < > 8.1*   < >  --    < > 8.2*   < >  --  9.2*  --  9.2*  HCT 29.7*   < > 26.6*   < >  --    < > 26.9*  --   --  27.0*  --  27.0*  PLT 91*  --  89*  --   --   --  96*  --   --   --   --   --   HEPARINUNFRC 0.29*   < > 0.16*   < > 0.16*  --   --   --  0.30  --  0.21*  --   CREATININE 0.88   < > 1.01*   < > 1.02*  --  1.06*  --   --   --  1.03*  --    < > = values in this interval not displayed.    Estimated Creatinine Clearance: 76.7 mL/min (A) (by C-G formula based on SCr of 1.03 mg/dL (H)).  Assessment: 54 yo female COVID+ on VV ECMO. Patient started on bivalirudin but was found to have multiple ICH on 8/13 head CT. Bivalirudin was stopped and pt was off anticoagulation at that time. Her ECMO circuit was changed 8/24. Pharmacy asked to start low-fixed rate heparin. Heparin infusion was held last week due to bleeding from trach site.  Heparin was started at low fixed rate for pump patency in setting of ICH. Concern with increased  clotting off CRRT as well as circuit clots - s/p circuit exchange 9/17 so will start to titrate heparin to a low goal of 0.2-0.3  Heparin level at the low end goal at 0.21 (goal of 0.2-0.3). The systemic heparin is running at 1150 units/hr with a fixed rate of 500 units/hr running into CRRT circuit.    Hgb down to 9.2  on most recent check.  Goal of Therapy:  HL 0.2-0.3 Monitor platelets by anticoagulation protocol: Yes   Plan:  Continue IV heparin at 1150 units/hr  Continue heparin level, CBC q 12 hrs.  Hildred Laser, PharmD Clinical Pharmacist **Pharmacist phone directory can now be found on [email or web address] ([email or web address]).  Listed under [place].

## 2020-02-28 NOTE — Procedures (Addendum)
Extracorporeal support note  ECLS support day: Cannulated 02/02/2020, Day 44 Indication: COVID 19 ARDS   Configuration: ECMO Mode: VV  Drainage cannula: RIJ [place]  Return cannula: Same   Pump speed: 4100 rpm  Pump flow: Flow (LPM): 5.06  Pump used: ECMO Device: Centrimag  Oxygenator: Quadrox O2 blender: 100% Sweep gas: 5.5L  ABG    Component Value Date/Time   PHART 7.338 (L) 02/28/2020 0446   PCO2ART 51.0 (H) 02/28/2020 0446   PO2ART 74 (L) 02/28/2020 0446   HCO3 27.4 02/28/2020 0446   TCO2 29 02/28/2020 0446   ACIDBASEDEF 2.0 02/25/2020 2026   O2SAT 93.0 02/28/2020 0446    Circuit check: no clots in oxygenator  Case discussed with ECMO specialist at bedside Anticoagulant: Heparin per pharmacy, discussed on multidisciplinary rounds Anticoagulation targets: Anticoagulation Goal: Hep 0.2-0.3  Changes in support: Continue sweep weans as able, increased PC on vent a bit  Anticipated goals/duration of support: Bridge to recovery  All issues highlighted discussed on multidisciplinary team rounding.  Alisha Furbish, DO [facility] 02/28/2020 7:16 AM

## 2020-02-28 NOTE — Progress Notes (Signed)
Occupational Therapy Treatment Patient Details Name: Alisha Stephenson MRN: [id number] DOB: [birth year] Today's Date: 02/28/2020    History of present illness Pt is 54 yo admitted 8/2 with hypoxemia due to Covid 19 PNA. 8/3 intubated. 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 CRRT 8/22-9/6, restarted 9/9. Reintubated 8/24 with ECMO circuit changed, 8/30 trach. 9/10 PEG tube. 9/17 circuit change. PMHx: DM, diverticulitis, Asthma   OT comments  Focus of session on UE AA/PROM in supine. Facilitated scapular mobility with ROM. Pt grimacing at times with shoulder ROM, limited to 90 degrees bilaterally.   Follow Up Recommendations  LTACH;Supervision/Assistance - 24 hour    Equipment Recommendations  Other (comment) (defer to next venue)    Recommendations for Other Services      Precautions / Restrictions Precautions Precautions: Other (comment) Precaution Comments: ECMO with RIJ access anchors at right shoulder and chest, right necrotic thumb and index finger, bruised/necrotic left hand, trach, vent, CRRT, sacral wound, Gtube to suction, Jtube for nutrition Required Braces or Orthoses: Other Brace Other Brace: B PRAFO       Mobility Bed Mobility                  Transfers                      Balance                                           ADL either performed or assessed with clinical judgement   ADL                                         General ADL Comments: total assist     Vision       Perception     Praxis      Cognition Arousal/Alertness: Awake/alert Behavior During Therapy: WFL for tasks assessed/performed Overall Cognitive Status: Impaired/Different from baseline Area of Impairment: Safety/judgement;Following commands                       Following Commands: Follows one step commands with increased time;Follows one step commands  inconsistently Safety/Judgement: Decreased awareness of deficits;Decreased awareness of safety              Exercises Exercises: General Upper Extremity General Exercises - Upper Extremity Shoulder Flexion: Both;5 reps;Supine;AAROM Shoulder Horizontal ABduction: AAROM;Both;5 reps;Supine Shoulder Horizontal ADduction: AAROM;Both;5 reps;Supine Elbow Flexion: AAROM;Both;5 reps;Supine Elbow Extension: AAROM;Both;5 reps;Supine Wrist Flexion: PROM;Both;5 reps;Supine Wrist Extension: 5 reps;Supine;PROM;Both Digit Composite Flexion: PROM;5 reps;Seated;Both Composite Extension: PROM;Both;5 reps;Supine   Shoulder Instructions       General Comments      Pertinent Vitals/ Pain       Pain Assessment: Faces Faces Pain Scale: Hurts little more Pain Location: B shoulders with ROM Pain Descriptors / Indicators: Grimacing;Sore;Guarding Pain Intervention(s): Monitored during session;Repositioned;RN gave pain meds during session  Home Living                                          Prior Functioning/Environment              Frequency  Min 2X/week  Progress Toward Goals  OT Goals(current goals can now be found in the care plan section)  Progress towards OT goals: Progressing toward goals  Acute Rehab OT Goals Patient Stated Goal: per husband, to see her dog Alisha Stephenson OT Goal Formulation: Patient unable to participate in goal setting Time For Goal Achievement: 03/08/20 Potential to Achieve Goals: [place] Discharge plan remains appropriate    Co-evaluation                 AM-PAC OT "6 Clicks" Daily Activity     Outcome Measure   Help from another person eating meals?: Total Help from another person taking care of personal grooming?: Total Help from another person toileting, which includes using toliet, bedpan, or urinal?: Total Help from another person bathing (including washing, rinsing, drying)?: Total Help from another person to put on  and taking off regular upper body clothing?: Total Help from another person to put on and taking off regular lower body clothing?: Total 6 Click Score: 6    End of Session    OT Visit Diagnosis: Muscle weakness (generalized) (M62.81);Other symptoms and signs involving cognitive function   Activity Tolerance No increased pain;Patient limited by fatigue   Patient Left in bed;with call bell/phone within reach;with nursing/sitter in room   Nurse Communication          Time: 8185-6314 OT Time Calculation (min): 21 min  Charges: OT General Charges $OT Visit: 1 Visit OT Treatments $Therapeutic Exercise: 8-22 mins  Nestor Lewandowsky, OTR/L Acute Rehabilitation Services Pager: [phone] Office: [phone]   Malka So 02/28/2020, 12:58 PM

## 2020-02-28 NOTE — Progress Notes (Signed)
[facility] for Heparin Indication: ECMO  Allergies  Allergen Reactions  . Sulfa Antibiotics Rash and Other (See Comments)    Patient Measurements: Height: 5\' 4"  (162.6 cm) Weight: 114.7 kg (252 lb 13.9 oz) IBW/kg (Calculated) : 54.7 Heparin Dosing Weight: 87 kg  Vital Signs: Temp: 98.3 F (36.8 C) (09/22 0800) Temp Source: Oral (09/22 0800) BP: 116/66 (09/22 1200) Pulse Rate: 100 (09/22 1200)  Labs: Recent Labs    02/26/20 1648 02/26/20 1808 02/27/20 0403 02/27/20 0418 02/27/20 1643 02/27/20 2003 02/27/20 2316 02/27/20 2316 02/28/20 0435 02/28/20 0436 02/28/20 0446  HGB 8.7*   < > 8.1*   < >  --    < > 8.5*   < > 8.2*  --  9.2*  HCT 29.7*   < > 26.6*   < >  --    < > 25.0*  --  26.9*  --  27.0*  PLT 91*  --  89*  --   --   --   --   --  96*  --   --   HEPARINUNFRC 0.29*   < > 0.16*  --  0.16*  --   --   --   --  0.30  --   CREATININE 0.88   < > 1.01*  --  1.02*  --   --   --  1.06*  --   --    < > = values in this interval not displayed.    Estimated Creatinine Clearance: 74.5 mL/min (A) (by C-G formula based on SCr of 1.06 mg/dL (H)).  Assessment: 54 yo female COVID+ on VV ECMO. Patient started on bivalirudin but was found to have multiple ICH on 8/13 head CT. Bivalirudin was stopped and pt was off anticoagulation at that time. Her ECMO circuit was changed 8/24. Pharmacy asked to start low-fixed rate heparin. Heparin infusion was held last week due to bleeding from trach site.  Heparin was started at low fixed rate for pump patency in setting of ICH. Concern with increased  clotting off CRRT as well as circuit clots - s/p circuit exchange 9/17 so will start to titrate heparin to a low goal of 0.2-0.3  Heparin level at goal this morning evening at 0.3 (goal of 0.2-0.3). The systemic heparin is running at 1150 units/hr with a fixed rate of 500 units/hr running into CRRT circuit.    Hgb down to 9.2 on most recent check, plt stable  90s this morning.   Goal of Therapy:  HL 0.2-0.3 Monitor platelets by anticoagulation protocol: Yes   Plan:  IV heparin at 1150 units/hr  Continue heparin level, CBC q 12 hrs.  Erin Hearing PharmD., BCPS Clinical Pharmacist 02/28/2020 12:45 PM  Please check AMION for all [facility] phone numbers After 10:00 PM, call [facility] [phone]

## 2020-02-28 NOTE — Progress Notes (Signed)
NAME:  SALMA WALROND, MRN:  [id number], DOB:  Jul 29, 1965, LOS: 85 ADMISSION DATE:  01/27/2020, CONSULTATION DATE:  8/3 REFERRING MD:  Alfredia Ferguson, CHIEF COMPLAINT:  Dyspnea   Brief History   54 y/o female admitted on 8/2 with severe acute respiratory failure with hypoxemia due to COVID 19 pneumonia.  She developed symptoms 1 week prior to admission.  Past Medical History  DM2 Diverticulitis Gallstones Ovarian cyst NAFLD Asthma  Significant Hospital Events   8/2 admit 8/3 ICU transfer, intubated 8/4 prone, paralyze 8/9 significant desaturations today 8/9 VV ECMO cannulation 8/13 ICH 8/30 trach  Consults:  PCCM ECMO team General surgery 02/27/2020 for sacral wound care possible debridement  Procedures:  8/3 ETT > 8/30 8/30 8-0 shiley trach 8/3 PICC >  8/9 LIJ MML 8/9 RIJ [place] 73F  9/8 trialysis 9/10 PEG-J (IR)  Significant Diagnostic Tests:  7/31 CT head > NAICP 7/31 MRI/MRA brain > no acute changes, possibly small aneurysm ACOM 8/13 CT head> multiple areas of ICH  Micro Data:  8/2 blood > NG 8/2 SARS COV 2 > positive 8/4 resp > negative 8/4 urine >  8/12 blood > E. faecalis (pan-sensitive) 8/12 resp: staph aureus> MSSA 8/25 blood>> NG 8/24 BAL> yeast  Antimicrobials:  See fever tab for past abx Current: none  Interim history/subjective:  No events, denies pain.  Remains on 5.5 sweep.  Objective   Blood pressure (!) 108/58, pulse 100, temperature 97.9 F (36.6 C), temperature source Oral, resp. rate (!) 23, height _0  (1.626 m), weight 114.7 kg, SpO2 96 %. CVP:  [8 mmHg] 8 mmHg  Vent Mode: PCV FiO2 (%):  [40 %] 40 % Set Rate:  [15 bmp] 15 bmp PEEP:  [10 cmH20] 10 cmH20 Plateau Pressure:  [18 cmH20-31 cmH20] 18 cmH20   Intake/Output Summary (Last 24 hours) at 02/28/2020 0717 Last data filed at 02/28/2020 0700 Gross per 24 hour  Intake 2090.57 ml  Output 2733 ml  Net -642.43 ml   Filed Weights   02/26/20 0500 02/27/20 0500 02/28/20 0500   Weight: 114 kg 115.1 kg 114.7 kg    Examination:  GEN: Obese female on mechanical life support, VV ECMO in place, right IJ cannulation HEENT: NCAT, tracking appropriately, following commands Neck: Tracheostomy tube in place, CDI, sutures still in place CV: Regular rate rhythm, S1-S2 PULM: Bilateral mechanically ventilated breath sounds GI: Soft nontender nondistended obese pannus EXT: Right upper extremity distal gangrene of the thumb and forefinger, stable NEURO: Alert oriented following commands PSYCH: Calm  Pressure Injury 02/14/20 Anus Medial Deep Tissue Pressure Injury - Purple or maroon localized area of discolored intact skin or blood-filled blister due to damage of underlying soft tissue from pressure and/or shear. (Active)  02/14/20 1300  Location: Anus  Location Orientation: Medial  Staging: Deep Tissue Pressure Injury - Purple or maroon localized area of discolored intact skin or blood-filled blister due to damage of underlying soft tissue from pressure and/or shear.  Wound Description (Comments):   Present on Admission: No     Pressure Injury Back Left;Mid Stage 2 -  Partial thickness loss of dermis presenting as a shallow open injury with a red, pink wound bed without slough. in skin fold on back (Active)     Location: Back  Location Orientation: Left;Mid  Staging: Stage 2 -  Partial thickness loss of dermis presenting as a shallow open injury with a red, pink wound bed without slough.  Wound Description (Comments): in skin fold on back  Present on  Admission: No     Pressure Injury 02/21/20 Sacrum Posterior Unstageable - Full thickness tissue loss in which the base of the injury is covered by slough (yellow, tan, gray, green or brown) and/or eschar (tan, brown or black) in the wound bed. Unstageable Sacral Wound (Active)  02/21/20 1206  Location: Sacrum  Location Orientation: Posterior  Staging: Unstageable - Full thickness tissue loss in which the base of the injury  is covered by slough (yellow, tan, gray, green or brown) and/or eschar (tan, brown or black) in the wound bed.  Wound Description (Comments): Unstageable Sacral Wound  Present on Admission: No   Labs reviewed with ECMO team at bedside on multidisciplinary rounds  Chest x-ray: 02/27/2020: Bilateral persistent infiltrates, stable lines and tubes The patient's images have been independently reviewed by me.     ABG slightly acidotic Cr stable on CRRT Lactate benign Fibrinogen slowly creeping up Plts improved, Hgb stable -0.6 L CXR stable bl infiltrates question loculated R effusion  Resolved Hospital Problem list   MSSA pneumonia Fungal pneumonia- Candida dublinensis Enterococcal bacteremia-recent blood cultures cleared Concern for possible embolic phenomenon- fingers and possibly CVAs were embolic  Assessment & Plan:   Critically ill due to Acute Hypxemic and AoC hypercapneic respiratory failure;  - likely underly OHS.   Requiring VV ECMO support and mechanical ventilation.  ARDS due to COVID 19 pneumonia, slowly resolving, now likely fibrotic stage disease  Tracheostomy in place -Continue VV ECMO support -Continue to titrate down sweep gas as tolerated -Discussed circuit with ECMO specialist at bedside -Continue head of bed elevated, ventilator associated pneumonia prophylaxis -Remains off of continuous sedation -Routine trach care. -Today: continue sweep weans, can do either SIMV or PS to attempt to liberalize ventilation a bit  Elevated LDH, hypo-fibrinogenemia and thrombocytopenia -Labs stable, continue to follow  ICH, multifocal. L-sided weakness.  Critical illness myopathy--improving slowly. -Continue SLP, PT and OT  Acute metabolic encephalopathy, improving Sundowning in the evenings -Continue delirium precautions -Encourage appropriate sleep-wake cycles. -Continue Seroquel nightly  Acute renal failure, severe uremia.  Anuric. -Continue CVVHD per  nephrology  Hypotension. Acute cor pulmonale, improved -Remains off vasopressors at this time -Continue to observe  Hyperglycemia  -Continue insulin basal bolus to maintain CBG goal of 140-180 -Continue to monitor  Pressure Injury, Skin, sacrum  -No plans for debridement at present - Continue local wound care and turning as able - WOC following  Best practice:  Diet: tube feeding Pain/Anxiety/Delirium protocol (if indicated): as above VAP protocol (if indicated): yes DVT prophylaxis: SCDs , heparin GI prophylaxis: pantoprazole Glucose control: ssi/basal Mobility: bed rest Code Status: limited Family Communication: team to update family  Disposition: ICU  This patient is critically ill with multiple organ system failure; which, requires frequent high complexity decision making, assessment, support, evaluation, and titration of therapies. This was completed through the application of advanced monitoring technologies and extensive interpretation of multiple databases. During this encounter critical care time was devoted to patient care services described in this note for 36 minutes.  Candee Furbish, DO [facility] 02/28/2020 7:17 AM

## 2020-02-29 ENCOUNTER — Inpatient Hospital Stay (HOSPITAL_COMMUNITY): Payer: PRIVATE HEALTH INSURANCE

## 2020-02-29 LAB — POCT I-STAT 7, (LYTES, BLD GAS, ICA,H+H)
Acid-Base Excess: 0 mmol/L (ref 0.0–2.0)
Acid-Base Excess: 0 mmol/L (ref 0.0–2.0)
Acid-Base Excess: 0 mmol/L (ref 0.0–2.0)
Acid-base deficit: 2 mmol/L (ref 0.0–2.0)
Acid-base deficit: 1 mmol/L (ref 0.0–2.0)
Bicarbonate: 26.7 mmol/L (ref 20.0–28.0)
Bicarbonate: 26.8 mmol/L (ref 20.0–28.0)
Bicarbonate: 26.4 mmol/L (ref 20.0–28.0)
Bicarbonate: 27.3 mmol/L (ref 20.0–28.0)
Bicarbonate: 26 mmol/L (ref 20.0–28.0)
Calcium, Ion: 1.23 mmol/L (ref 1.15–1.40)
Calcium, Ion: 1.2 mmol/L (ref 1.15–1.40)
Calcium, Ion: 1.25 mmol/L (ref 1.15–1.40)
Calcium, Ion: 1.24 mmol/L (ref 1.15–1.40)
Calcium, Ion: 1.21 mmol/L (ref 1.15–1.40)
HCT: 24 % — ABNORMAL LOW (ref 36.0–46.0)
HCT: 27 % — ABNORMAL LOW (ref 36.0–46.0)
HCT: 30 % — ABNORMAL LOW (ref 36.0–46.0)
HCT: 28 % — ABNORMAL LOW (ref 36.0–46.0)
HCT: 26 % — ABNORMAL LOW (ref 36.0–46.0)
Hemoglobin: 8.2 g/dL — ABNORMAL LOW (ref 12.0–15.0)
Hemoglobin: 9.2 g/dL — ABNORMAL LOW (ref 12.0–15.0)
Hemoglobin: 10.2 g/dL — ABNORMAL LOW (ref 12.0–15.0)
Hemoglobin: 9.5 g/dL — ABNORMAL LOW (ref 12.0–15.0)
Hemoglobin: 8.8 g/dL — ABNORMAL LOW (ref 12.0–15.0)
O2 Saturation: 96 %
O2 Saturation: 95 %
O2 Saturation: 97 %
O2 Saturation: 95 %
O2 Saturation: 97 %
Patient temperature: 98.4
Patient temperature: 98.4
Patient temperature: 98
Patient temperature: 98
Patient temperature: 97.8
Potassium: 4.7 mmol/L (ref 3.5–5.1)
Potassium: 4.8 mmol/L (ref 3.5–5.1)
Potassium: 4.9 mmol/L (ref 3.5–5.1)
Potassium: 5 mmol/L (ref 3.5–5.1)
Potassium: 4.8 mmol/L (ref 3.5–5.1)
Sodium: 136 mmol/L (ref 135–145)
Sodium: 135 mmol/L (ref 135–145)
Sodium: 133 mmol/L — ABNORMAL LOW (ref 135–145)
Sodium: 132 mmol/L — ABNORMAL LOW (ref 135–145)
Sodium: 134 mmol/L — ABNORMAL LOW (ref 135–145)
TCO2: 28 mmol/L (ref 22–32)
TCO2: 28 mmol/L (ref 22–32)
TCO2: 28 mmol/L (ref 22–32)
TCO2: 29 mmol/L (ref 22–32)
TCO2: 28 mmol/L (ref 22–32)
pCO2 arterial: 51.7 mmHg — ABNORMAL HIGH (ref 32.0–48.0)
pCO2 arterial: 50.3 mmHg — ABNORMAL HIGH (ref 32.0–48.0)
pCO2 arterial: 61.3 mmHg — ABNORMAL HIGH (ref 32.0–48.0)
pCO2 arterial: 56.6 mmHg — ABNORMAL HIGH (ref 32.0–48.0)
pCO2 arterial: 51.8 mmHg — ABNORMAL HIGH (ref 32.0–48.0)
pH, Arterial: 7.32 — ABNORMAL LOW (ref 7.350–7.450)
pH, Arterial: 7.334 — ABNORMAL LOW (ref 7.350–7.450)
pH, Arterial: 7.241 — ABNORMAL LOW (ref 7.350–7.450)
pH, Arterial: 7.289 — ABNORMAL LOW (ref 7.350–7.450)
pH, Arterial: 7.307 — ABNORMAL LOW (ref 7.350–7.450)
pO2, Arterial: 93 mmHg (ref 83.0–108.0)
pO2, Arterial: 79 mmHg — ABNORMAL LOW (ref 83.0–108.0)
pO2, Arterial: 111 mmHg — ABNORMAL HIGH (ref 83.0–108.0)
pO2, Arterial: 86 mmHg (ref 83.0–108.0)
pO2, Arterial: 94 mmHg (ref 83.0–108.0)

## 2020-02-29 LAB — GLUCOSE, CAPILLARY
Glucose-Capillary: 153 mg/dL — ABNORMAL HIGH (ref 70–99)
Glucose-Capillary: 175 mg/dL — ABNORMAL HIGH (ref 70–99)
Glucose-Capillary: 176 mg/dL — ABNORMAL HIGH (ref 70–99)
Glucose-Capillary: 185 mg/dL — ABNORMAL HIGH (ref 70–99)
Glucose-Capillary: 207 mg/dL — ABNORMAL HIGH (ref 70–99)
Glucose-Capillary: 227 mg/dL — ABNORMAL HIGH (ref 70–99)

## 2020-02-29 LAB — CBC
HCT: 25 % — ABNORMAL LOW (ref 36.0–46.0)
HCT: 29.5 % — ABNORMAL LOW (ref 36.0–46.0)
Hemoglobin: 7.7 g/dL — ABNORMAL LOW (ref 12.0–15.0)
Hemoglobin: 9.1 g/dL — ABNORMAL LOW (ref 12.0–15.0)
MCH: 32 pg (ref 26.0–34.0)
MCH: 31.5 pg (ref 26.0–34.0)
MCHC: 30.8 g/dL (ref 30.0–36.0)
MCHC: 30.8 g/dL (ref 30.0–36.0)
MCV: 103.7 fL — ABNORMAL HIGH (ref 80.0–100.0)
MCV: 102.1 fL — ABNORMAL HIGH (ref 80.0–100.0)
Platelets: 90 10*3/uL — ABNORMAL LOW (ref 150–400)
Platelets: 93 10*3/uL — ABNORMAL LOW (ref 150–400)
RBC: 2.41 MIL/uL — ABNORMAL LOW (ref 3.87–5.11)
RBC: 2.89 MIL/uL — ABNORMAL LOW (ref 3.87–5.11)
RDW: 21.1 % — ABNORMAL HIGH (ref 11.5–15.5)
RDW: 21 % — ABNORMAL HIGH (ref 11.5–15.5)
WBC: 7.6 10*3/uL (ref 4.0–10.5)
WBC: 9.5 10*3/uL (ref 4.0–10.5)
nRBC: 0.4 % — ABNORMAL HIGH (ref 0.0–0.2)
nRBC: 0.4 % — ABNORMAL HIGH (ref 0.0–0.2)

## 2020-02-29 LAB — HEPARIN LEVEL (UNFRACTIONATED)
Heparin Unfractionated: 0.2 IU/mL — ABNORMAL LOW (ref 0.30–0.70)
Heparin Unfractionated: 0.17 IU/mL — ABNORMAL LOW (ref 0.30–0.70)

## 2020-02-29 LAB — RENAL FUNCTION PANEL
Albumin: 3.1 g/dL — ABNORMAL LOW (ref 3.5–5.0)
Albumin: 3.1 g/dL — ABNORMAL LOW (ref 3.5–5.0)
Anion gap: 10 (ref 5–15)
Anion gap: 10 (ref 5–15)
BUN: 37 mg/dL — ABNORMAL HIGH (ref 6–20)
BUN: 38 mg/dL — ABNORMAL HIGH (ref 6–20)
CO2: 25 mmol/L (ref 22–32)
CO2: 24 mmol/L (ref 22–32)
Calcium: 8.8 mg/dL — ABNORMAL LOW (ref 8.9–10.3)
Calcium: 8.8 mg/dL — ABNORMAL LOW (ref 8.9–10.3)
Chloride: 99 mmol/L (ref 98–111)
Chloride: 98 mmol/L (ref 98–111)
Creatinine, Ser: 1.03 mg/dL — ABNORMAL HIGH (ref 0.44–1.00)
Creatinine, Ser: 1.03 mg/dL — ABNORMAL HIGH (ref 0.44–1.00)
GFR calc Af Amer: >60 mL/min (ref >60)
GFR calc Af Amer: >60 mL/min (ref >60)
GFR calc non Af Amer: >60 mL/min (ref >60)
GFR calc non Af Amer: >60 mL/min (ref >60)
Glucose, Bld: 174 mg/dL — ABNORMAL HIGH (ref 70–99)
Glucose, Bld: 239 mg/dL — ABNORMAL HIGH (ref 70–99)
Phosphorus: 2 mg/dL — ABNORMAL LOW (ref 2.5–4.6)
Phosphorus: 3.2 mg/dL (ref 2.5–4.6)
Potassium: 4.5 mmol/L (ref 3.5–5.1)
Potassium: 5 mmol/L (ref 3.5–5.1)
Sodium: 134 mmol/L — ABNORMAL LOW (ref 135–145)
Sodium: 132 mmol/L — ABNORMAL LOW (ref 135–145)

## 2020-02-29 LAB — PREPARE RBC (CROSSMATCH)

## 2020-02-29 LAB — LACTIC ACID, PLASMA
Lactic Acid, Venous: 2.3 mmol/L (ref 0.5–1.9)
Lactic Acid, Venous: 2.1 mmol/L (ref 0.5–1.9)

## 2020-02-29 LAB — FIBRINOGEN: Fibrinogen: 658 mg/dL — ABNORMAL HIGH (ref 210–475)

## 2020-02-29 LAB — LACTATE DEHYDROGENASE: LDH: 268 U/L — ABNORMAL HIGH (ref 98–192)

## 2020-02-29 LAB — MAGNESIUM: Magnesium: 2.8 mg/dL — ABNORMAL HIGH (ref 1.7–2.4)

## 2020-02-29 MED ORDER — MIDAZOLAM HCL 2 MG/2ML IJ SOLN
2.0000 mg | INTRAMUSCULAR | Status: AC | PRN
  Administered 2020-02-29: 2 mg via INTRAVENOUS
  Administered 2020-02-29: 1 mg via INTRAVENOUS
  Filled 2020-02-29: qty 4

## 2020-02-29 MED ORDER — PRISMASOL BGK 0/2.5 32-2.5 MEQ/L IV SOLN
INTRAVENOUS | Status: DC
  Filled 2020-02-29 (×31): qty 5000

## 2020-02-29 MED ORDER — SODIUM CHLORIDE 0.9% IV SOLUTION: Freq: Once | INTRAVENOUS | Status: AC

## 2020-02-29 NOTE — Progress Notes (Signed)
[place] [facility] for Heparin Indication: ECMO  Allergies  Allergen Reactions  . Sulfa Antibiotics Rash and Other (See Comments)    Patient Measurements: Height: 5\' 4"  (162.6 cm) Weight: 114.7 kg (252 lb 13.9 oz) IBW/kg (Calculated) : 54.7 Heparin Dosing Weight: 87 kg  Vital Signs: Temp: 98.2 F (36.8 C) (09/22 2000) BP: 93/51 (09/23 0500) Pulse Rate: 96 (09/23 0500)  Labs: Recent Labs    02/27/20 1643 02/28/20 0435 02/28/20 0436 02/28/20 0446 02/28/20 1618 02/28/20 1626 02/28/20 1753 02/28/20 1753 02/28/20 1952 02/28/20 1952 02/29/20 0411 02/29/20 0423  HGB  --  8.2*  --    < >  --    < > 8.8*   < > 8.5*   < > 7.7* 8.2*  HCT  --  26.9*  --    < >  --    < > 28.9*   < > 25.0*  --  25.0* 24.0*  PLT  --  96*  --   --   --   --  113*  --   --   --  90*  --   HEPARINUNFRC   < >  --  0.30  --  0.21*  --   --   --   --   --  0.20*  --   CREATININE  --  1.06*  --   --  1.03*  --   --   --   --   --  1.03*  --    < > = values in this interval not displayed.    Estimated Creatinine Clearance: 76.7 mL/min (A) (by C-G formula based on SCr of 1.03 mg/dL (H)).  Assessment: 54 yo female COVID+ on VV ECMO. Patient started on bivalirudin but was found to have multiple ICH on 8/13 head CT. Bivalirudin was stopped and pt was off anticoagulation at that time. Her ECMO circuit was changed 8/24. Pharmacy asked to start low-fixed rate heparin.   Heparin was started at low fixed rate for pump patency in setting of ICH. Concern with increased clotting off CRRT as well as circuit clots - s/p circuit exchange 9/17 so will titrate heparin to a low goal of 0.2-0.3  Heparin level at the low end goal at 0.2 on 1150 units/hr with a fixed rate of 500 units/hr running into CRRT circuit. Hgb stable ~8.5-9.5.   Goal of Therapy:  HL 0.2-0.3 Monitor platelets by anticoagulation protocol: Yes   Plan:  Continue IV heparin at 1150 units/hr  Monitor q12hr HL,  CBC/plt Monitor for signs/symptoms of bleeding   Benetta Spar, PharmD, BCPS, BCCP Clinical Pharmacist  Please check AMION for all [facility] phone numbers After 10:00 PM, call [place] [phone]

## 2020-02-29 NOTE — Progress Notes (Signed)
Patient ID: Alisha Stephenson, female   DOB: Feb 01, 1966, 54 y.o.   MRN: [id number] S: No events overnight O:BP (!) 89/47   Pulse 98   Temp 98.3 F (36.8 C) (Oral)   Resp (!) 29   Ht _0  (1.626 m)   Wt 112.8 kg   SpO2 100%   BMI 42.69 kg/m   Intake/Output Summary (Last 24 hours) at 02/29/2020 0952 Last data filed at 02/29/2020 0930 Gross per 24 hour  Intake 3584.29 ml  Output 2904 ml  Net 680.29 ml   Intake/Output: I/O last 3 completed shifts: In: 3608.5 [I.V.:413.3; Blood:46.7; Other:60; NG/GT:2275; IV Piggyback:813.6] Out: 2542 [Other:4165]  Intake/Output this shift:  Total I/O In: 1106.3 [I.V.:82.9; Blood:323.3; Other:570; NG/GT:130] Out: 262 [Other:262] Weight change: -1.9 kg Gen: awake and alert on vent via trach CVS:RRR Resp: CTA Abd: +BS, soft Ext: no edema  Recent Labs  Lab 02/25/20 0500 02/25/20 0802 02/25/20 1656 02/25/20 1711 02/26/20 0417 02/26/20 0821 02/26/20 1648 02/26/20 1808 02/27/20 0403 02/27/20 0418 02/27/20 1643 02/27/20 2003 02/28/20 0435 02/28/20 0446 02/28/20 1618 02/28/20 1626 02/28/20 1732 02/28/20 1952 02/29/20 0411 02/29/20 0423 02/29/20 0803  NA 136   < > 136   < > 135   < > 135   < > 134*   < > 134*   < > 136   < > 134* 134* 134* 134* 134* 136 135  K 4.5   < > 4.8   < > 4.5   < > 5.0   < > 4.8   < > 5.1   < > 4.8   < > 5.0 5.0 5.4* 5.3* 4.5 4.7 4.8  CL 103   < > 101   < > 100  --  101  --  101  --  98  --  101  --  99  --   --   --  99  --   --   CO2 24   < > 25   < > 25  --  26  --  25  --  24  --  25  --  26  --   --   --  25  --   --   GLUCOSE 226*   < > 237*   < > 290*  --  216*  --  208*  --  271*  --  221*  --  235*  --   --   --  174*  --   --   BUN 37*   < > 35*   < > 36*  --  31*  --  34*  --  37*  --  39*  --  37*  --   --   --  37*  --   --   CREATININE 0.99   < > 0.97   < > 0.95  --  0.88  --  1.01*  --  1.02*  --  1.06*  --  1.03*  --   --   --  1.03*  --   --   ALBUMIN 3.2*  --  3.4*  --  3.4*  --   --   --  3.2*   --  3.2*  --   --   --  3.0*  --   --   --  3.1*  --   --   CALCIUM 8.8*   < > 8.9   < > 8.9  --  9.0  --  8.8*  --  8.7*  --  9.0  --  8.8*  --   --   --  8.8*  --   --   PHOS 2.4*   < > 3.0  --  2.6  --   --   --  1.9*  --  3.5  --  2.4*  --  4.0  --   --   --  2.0*  --   --    < > = values in this interval not displayed.   Liver Function Tests: Recent Labs  Lab 02/27/20 1643 02/28/20 1618 02/29/20 0411  ALBUMIN 3.2* 3.0* 3.1*   No results for input(s): LIPASE, AMYLASE in the last 168 hours. No results for input(s): AMMONIA in the last 168 hours. CBC: Recent Labs  Lab 02/26/20 1648 02/26/20 1808 02/27/20 0403 02/27/20 0418 02/28/20 0435 02/28/20 0446 02/28/20 1753 02/28/20 1952 02/29/20 0411 02/29/20 0423 02/29/20 0803  WBC 8.5   < > 6.7   < > 8.8  --  12.0*  --  7.6  --   --   NEUTROABS  --   --   --   --   --   --  9.5*  --   --   --   --   HGB 8.7*   < > 8.1*   < > 8.2*   < > 8.8*   < > 7.7* 8.2* 9.2*  HCT 29.7*   < > 26.6*   < > 26.9*   < > 28.9*   < > 25.0* 24.0* 27.0*  MCV 104.2*  --  102.7*  --  102.7*  --  102.5*  --  103.7*  --   --   PLT 91*   < > 89*   < > 96*  --  113*  --  90*  --   --    < > = values in this interval not displayed.   Cardiac Enzymes: No results for input(s): CKTOTAL, CKMB, CKMBINDEX, TROPONINI in the last 168 hours. CBG: Recent Labs  Lab 02/28/20 1623 02/28/20 1950 02/28/20 2340 02/29/20 0420 02/29/20 0801  GLUCAP 217* 214* 167* 175* 153*    Iron Studies: No results for input(s): IRON, TIBC, TRANSFERRIN, FERRITIN in the last 72 hours. Studies/Results: DG CHEST PORT 1 VIEW  Result Date: 02/29/2020 CLINICAL DATA:  ARDS.  Tracheostomy.  ECMO. EXAM: PORTABLE CHEST 1 VIEW COMPARISON:  02/28/2020. FINDINGS: Tracheostomy tube, left IJ line, right PICC line, ECMO catheter stable position. Stable cardiomegaly. Diffuse severe bilateral pulmonary infiltrates again noted without interim change. No prominent pleural effusion. No pneumothorax.  Prior cervical spine fusion. IMPRESSION: 1.  Lines and tubes including ECMO device in stable position. 2.  Stable cardiomegaly. 3. Diffuse severe bilateral pulmonary infiltrates again noted without interim change. Electronically Signed   By: Marcello Moores  Register   On: 02/29/2020 06:52   DG CHEST PORT 1 VIEW  Result Date: 02/28/2020 CLINICAL DATA:  Trach, ARDS, ECMO EXAM: PORTABLE CHEST 1 VIEW COMPARISON:  02/27/2020 FINDINGS: Tracheostomy, left dialysis catheter and ECMO device remain in place, unchanged. Diffuse bilateral airspace disease. Probable loculated right pleural effusion. Mild cardiomegaly. Right PICC line remains in place. No significant change since prior study. IMPRESSION: No interval change. Electronically Signed   By: Rolm Baptise M.D.   On: 02/28/2020 08:12   . amiodarone  200 mg Per Tube Daily  . vitamin C  500 mg Per Tube Daily  . chlorhexidine gluconate (MEDLINE KIT)  15 mL Mouth  Rinse BID  . Chlorhexidine Gluconate Cloth  6 each Topical Q0600  . clonazePAM  1 mg Per Tube Q6H  . collagenase   Topical Daily  . docusate  100 mg Per Tube BID  . feeding supplement (PROSource TF)  45 mL Per Tube QID  . Gerhardt's butt cream   Topical BID  . insulin aspart  0-15 Units Subcutaneous Q4H  . insulin aspart  15 Units Subcutaneous Q4H  . insulin detemir  50 Units Subcutaneous BID  . linagliptin  5 mg Per Tube Daily  . mouth rinse  15 mL Mouth Rinse 10 times per day  . melatonin  9 mg Per Tube QHS  . metoCLOPramide (REGLAN) injection  5 mg Intravenous Q8H  . midodrine  10 mg Per Tube TID  . multivitamin  1 tablet Per Tube QHS  . oxyCODONE  10 mg Per Tube Q6H  . pantoprazole (PROTONIX) IV  40 mg Intravenous BID  . pneumococcal 23 valent vaccine  0.5 mL Intramuscular Tomorrow-1000  . polyethylene glycol  17 g Per Tube Daily  . QUEtiapine  100 mg Per Tube QHS  . sennosides  5 mL Per Tube BID  . zinc sulfate  220 mg Per Tube Daily    BMET    Component Value Date/Time   NA 135  02/29/2020 0803   K 4.8 02/29/2020 0803   CL 99 02/29/2020 0411   CO2 25 02/29/2020 0411   GLUCOSE 174 (H) 02/29/2020 0411   BUN 37 (H) 02/29/2020 0411   CREATININE 1.03 (H) 02/29/2020 0411   CALCIUM 8.8 (L) 02/29/2020 0411   GFRNONAA >60 02/29/2020 0411   GFRAA >60 02/29/2020 0411   CBC    Component Value Date/Time   WBC 7.6 02/29/2020 0411   RBC 2.41 (L) 02/29/2020 0411   HGB 9.2 (L) 02/29/2020 0803   HCT 27.0 (L) 02/29/2020 0803   PLT 90 (L) 02/29/2020 0411   MCV 103.7 (H) 02/29/2020 0411   MCH 32.0 02/29/2020 0411   MCHC 30.8 02/29/2020 0411   RDW 21.1 (H) 02/29/2020 0411   LYMPHSABS 1.0 02/28/2020 1753   MONOABS 0.8 02/28/2020 1753   EOSABS 0.4 02/28/2020 1753   BASOSABS 0.1 02/28/2020 1753     Assessment/Plan:  1. Anuric AKI in setting of ischemic ATN related to septic shock from COVD-19 PNA requiring VV ECMO. Started on CRRT 8/22-02/12/20 and transitioned to IHD on 02/13/20 but back to CRRT on 02/15/20 due to volume overload and large IVF intake.  1. Tolerating CRRT and tolerating UF. 2. No changes to CRRT today. 2. Vascular access- L IJ trialysis catheter placed 02/15/20. 1. Will need new access tomorrow. Will discuss with PCCM 3. Hypophosphatemia- due to CRRT-will re-order replacement phos and follow. 4. Shock due to COVID-19 infection diagnosed 01/09/2020 as well as enterococcal bacteremia- completed zosyn 02/13/20, negative TTE. Off of pressors. 5. Acute hypoxic/hypercapneic respiratory failure in setting of severe covid PNA/ARDS- now on VV ECMO per PCCM/CHF. Lung function slowly returning. UF as bp tolerates. 6. ICH- stable by CT scan 8/14. Likely will have long-term deficits. 7. Thrombocytopenia- stable 8. Morbid obesity 9. DM type 2 10. P. Atrial fibrillation  Donetta Potts, MD [facility] [phone]

## 2020-02-29 NOTE — Progress Notes (Signed)
Pt has had continued bleeding from trach whilst being in-line suctioned. Chugging issues at the beginning of the shift required 2 albumins to be given even with lowered flows on ECMO circuit. AM labs showed hgb 7.7, dropped from 8.8 yesterday evening. ECMO flows are 5.0LPM on 4000 RPM. Sweep increased to 5.5 based on morning ABG. Dr. Haroldine Laws ordered 1 unit RBC to be transfused.

## 2020-02-29 NOTE — Progress Notes (Addendum)
NAME:  Alisha Stephenson, MRN:  [id number], DOB:  1966/05/25, LOS: 67 ADMISSION DATE:  01/07/2020, CONSULTATION DATE:  8/3 REFERRING MD:  Alfredia Ferguson, CHIEF COMPLAINT:  Dyspnea   Brief History   54 y/o female admitted on 8/2 with severe acute respiratory failure with hypoxemia due to COVID 19 pneumonia.  She developed symptoms 1 week prior to admission.  Past Medical History  DM2 Diverticulitis Gallstones Ovarian cyst NAFLD Asthma  Significant Hospital Events   8/2 admit 8/3 ICU transfer, intubated 8/4 prone, paralyze 8/9 significant desaturations today 8/9 VV ECMO cannulation 8/13 ICH 8/30 trach  Consults:  PCCM ECMO team General surgery 02/27/2020 for sacral wound care possible debridement  Procedures:  8/3 ETT > 8/30 8/30 8-0 shiley trach 8/3 PICC >  8/9 LIJ MML 8/9 RIJ [place] 37F  9/8 trialysis 9/10 PEG-J (IR)  Significant Diagnostic Tests:  7/31 CT head > NAICP 7/31 MRI/MRA brain > no acute changes, possibly small aneurysm ACOM 8/13 CT head> multiple areas of ICH  Micro Data:  8/2 blood > NG 8/2 SARS COV 2 > positive 8/4 resp > negative 8/4 urine >  8/12 blood > E. faecalis (pan-sensitive) 8/12 resp: staph aureus> MSSA 8/25 blood>> NG 8/24 BAL> yeast  Antimicrobials:  See fever tab for past abx Current: none  Interim history/subjective:  No events, nodding appropriately to questions.  Objective   Blood pressure 116/64, pulse 100, temperature 98.3 F (36.8 C), temperature source Oral, resp. rate (!) 29, height _0  (1.626 m), weight 112.8 kg, SpO2 98 %.    Vent Mode: PCV FiO2 (%):  [40 %] 40 % Set Rate:  [15 bmp-45 bmp] 15 bmp PEEP:  [10 cmH20] 10 cmH20 Plateau Pressure:  [18 cmH20-35 cmH20] 35 cmH20   Intake/Output Summary (Last 24 hours) at 02/29/2020 1037 Last data filed at 02/29/2020 1000 Gross per 24 hour  Intake 3552.43 ml  Output 3069 ml  Net 483.43 ml   Filed Weights   02/27/20 0500 02/28/20 0500 02/29/20 0500  Weight: 115.1 kg 114.7  kg 112.8 kg    Examination:  GEN: Obese female on mechanical life support, VV ECMO in place, right IJ cannulation HEENT: NCAT, tracking appropriately, following commands Neck: Tracheostomy tube in place, CDI, ongoing bloody secretions CV: Regular rate rhythm, S1-S2 PULM: Bilateral mechanically ventilated breath sounds GI: Soft nontender nondistended obese pannus EXT: Right upper extremity distal gangrene of the thumb and forefinger, stable NEURO: Alert oriented following commands PSYCH: Calm, interactive  Pressure Injury 02/14/20 Anus Medial Deep Tissue Pressure Injury - Purple or maroon localized area of discolored intact skin or blood-filled blister due to damage of underlying soft tissue from pressure and/or shear. (Active)  02/14/20 1300  Location: Anus  Location Orientation: Medial  Staging: Deep Tissue Pressure Injury - Purple or maroon localized area of discolored intact skin or blood-filled blister due to damage of underlying soft tissue from pressure and/or shear.  Wound Description (Comments):   Present on Admission: No     Pressure Injury Back Left;Mid Stage 2 -  Partial thickness loss of dermis presenting as a shallow open injury with a red, pink wound bed without slough. in skin fold on back (Active)     Location: Back  Location Orientation: Left;Mid  Staging: Stage 2 -  Partial thickness loss of dermis presenting as a shallow open injury with a red, pink wound bed without slough.  Wound Description (Comments): in skin fold on back  Present on Admission: No     Pressure  Injury 02/21/20 Sacrum Posterior Unstageable - Full thickness tissue loss in which the base of the injury is covered by slough (yellow, tan, gray, green or brown) and/or eschar (tan, brown or black) in the wound bed. Unstageable Sacral Wound (Active)  02/21/20 1206  Location: Sacrum  Location Orientation: Posterior  Staging: Unstageable - Full thickness tissue loss in which the base of the injury is  covered by slough (yellow, tan, gray, green or brown) and/or eschar (tan, brown or black) in the wound bed.  Wound Description (Comments): Unstageable Sacral Wound  Present on Admission: No   Labs reviewed with ECMO team at bedside on multidisciplinary rounds  Chest x-ray: 02/27/2020: Bilateral persistent infiltrates, stable lines and tubes The patient's images have been independently reviewed by me.    ABG slightly acidotic Chemistries fine Lactate mildly up 2.3 Hgb slightly down 8.8>>7.7, getting 1 unit pRBC WBC now normal Plts 113>>90 LDH 284>>268 Fibrinogen 675>>658  Resolved Hospital Problem list   MSSA pneumonia Fungal pneumonia- Candida dublinensis Enterococcal bacteremia-recent blood cultures cleared Concern for possible embolic phenomenon- fingers and possibly CVAs were embolic  Assessment & Plan:   Critically ill due to Acute Hypxemic and AoC hypercapneic respiratory failure;  - likely underly OHS.   Requiring VV ECMO support and mechanical ventilation.  ARDS due to COVID 19 pneumonia, slowly resolving, now likely fibrotic stage disease  Tracheostomy in place -Continue VV ECMO support -Continue to titrate down sweep gas as tolerated -Discussed circuit with ECMO specialist at bedside -Continue head of bed elevated, ventilator associated pneumonia prophylaxis -Remains off of continuous sedation -Routine trach care. -Today: continue sweep wean trials  Hemoptysis- check tracheal aspirate culture  Elevated LDH, hypo-fibrinogenemia and thrombocytopenia -Labs stable, continue to follow  ICH, multifocal. L-sided weakness.  Critical illness myopathy--improving slowly. -Continue SLP, PT and OT  Acute metabolic encephalopathy, improving Sundowning in the evenings -Continue delirium precautions -Encourage appropriate sleep-wake cycles. -Continue Seroquel nightly  Acute renal failure, severe uremia.  Anuric. -Continue CVVHD per nephrology  Hypotension. Acute cor  pulmonale, improved -Remains off vasopressors at this time -Continue to observe  Hyperglycemia  -Continue insulin basal bolus to maintain CBG goal of 140-180 -Continue to monitor  Pressure Injury, Skin, sacrum  -No plans for debridement at present - Continue local wound care, hydrotherapy and turning as able - WOC following  Best practice:  Diet: tube feeding Pain/Anxiety/Delirium protocol (if indicated): as above VAP protocol (if indicated): yes DVT prophylaxis: SCDs , heparin GI prophylaxis: pantoprazole Glucose control: ssi/basal Mobility: bed rest Code Status: limited Family Communication: husband at bedside and updated daily Disposition: ICU  This patient is critically ill with multiple organ system failure; which, requires frequent high complexity decision making, assessment, support, evaluation, and titration of therapies. This was completed through the application of advanced monitoring technologies and extensive interpretation of multiple databases. During this encounter critical care time was devoted to patient care services described in this note for 38 minutes.  Candee Furbish, MD [facility] 02/29/2020 10:37 AM

## 2020-02-29 NOTE — Progress Notes (Signed)
[place] for Heparin Indication: ECMO  Allergies  Allergen Reactions  . Sulfa Antibiotics Rash and Other (See Comments)    Patient Measurements: Height: 5\' 4"  (162.6 cm) Weight: 112.8 kg (248 lb 10.9 oz) IBW/kg (Calculated) : 54.7 Heparin Dosing Weight: 87 kg  Vital Signs: Temp: 98.3 F (36.8 C) (09/23 0840) Temp Source: Oral (09/23 0840) BP: 95/45 (09/23 1800) Pulse Rate: 92 (09/23 1800)  Labs: Recent Labs    02/28/20 1618 02/28/20 1626 02/28/20 1753 02/28/20 1952 02/29/20 0411 02/29/20 0423 02/29/20 0803 02/29/20 0803 02/29/20 1635 02/29/20 1645  HGB  --    < > 8.8*   < > 7.7*   < > 9.2*   < > 9.1* 10.2*  HCT  --    < > 28.9*   < > 25.0*   < > 27.0*  --  29.5* 30.0*  PLT  --   --  113*  --  90*  --   --   --  93*  --   HEPARINUNFRC 0.21*  --   --   --  0.20*  --   --   --  0.17*  --   CREATININE 1.03*  --   --   --  1.03*  --   --   --  1.03*  --    < > = values in this interval not displayed.    Estimated Creatinine Clearance: 75.9 mL/min (A) (by C-G formula based on SCr of 1.03 mg/dL (H)).  Assessment: 54 yo female COVID+ on VV ECMO. Patient started on bivalirudin but was found to have multiple ICH on 8/13 head CT. Bivalirudin was stopped and pt was off anticoagulation at that time. Her ECMO circuit was changed 8/24. Pharmacy asked to start low-fixed rate heparin.   Heparin was started at low fixed rate for pump patency in setting of ICH. Concern with increased clotting off CRRT as well as circuit clots - s/p circuit exchange 9/17 so will titrate heparin to a low goal of 0.2-0.3  Heparin level at the low end goal at 0.17 on 1150 units/hr with a fixed rate of 500 units/hr running into CRRT circuit. Hgb stable ~8.5-9.5.   Goal of Therapy:  HL 0.2-0.3 Monitor platelets by anticoagulation protocol: Yes   Plan:  Continue IV heparin at 1150 units/hr  Monitor q12hr HL, CBC/plt Monitor for signs/symptoms of bleeding    Barth Kirks, PharmD, BCPS, BCCCP Clinical Pharmacist [phone]  Please check AMION for all [place] numbers  02/29/2020 6:28 PM

## 2020-02-29 NOTE — Progress Notes (Signed)
Physical Therapy Wound Treatment Patient Details  Name: Alisha Stephenson MRN: [id number] Date of Birth: 07-28-1965  Today's Date: 02/29/2020 Time: 1130-1225 Time Calculation (min): 55 min  Subjective  Subjective: Non-verbal throughout Patient and Family Stated Goals: Heal wound - per husband Date of Onset:  (Unknown)  Pain Score:  Pt was premedicated prior to session and during session.  Wound Assessment  Pressure Injury 02/21/20 Sacrum Posterior Unstageable - Full thickness tissue loss in which the base of the injury is covered by slough (yellow, tan, gray, green or brown) and/or eschar (tan, brown or black) in the wound bed. Unstageable Sacral Wound (Active)  Dressing Type ABD;Barrier Film (skin prep);Gauze (Comment);Moist to dry 02/29/20 1251  Dressing Changed;Clean;Dry;Intact 02/29/20 1251  Dressing Change Frequency Daily 02/29/20 1251  State of Healing Eschar 02/29/20 1251  Site / Wound Assessment Pink;Yellow;Black 02/29/20 1251  % Wound base Red or Granulating 20% 02/29/20 1251  % Wound base Yellow/Fibrinous Exudate 40% 02/29/20 1251  % Wound base Black/Eschar 40% 02/29/20 1251  % Wound base Other/Granulation Tissue (Comment) 0% 02/29/20 1251  Peri-wound Assessment Intact;Bleeding;Erythema (blanchable) 02/29/20 1251  Wound Length (cm) 7.5 cm 02/28/20 1358  Wound Width (cm) 7.5 cm 02/28/20 1358  Wound Depth (cm) 0.2 cm 02/28/20 1358  Wound Surface Area (cm^2) 56.25 cm^2 02/28/20 1358  Wound Volume (cm^3) 11.25 cm^3 02/28/20 1358  Margins Unattached edges (unapproximated) 02/29/20 1251  Drainage Amount Moderate 02/29/20 1251  Drainage Description Serosanguineous 02/28/20 1358  Treatment Debridement (Selective);Hydrotherapy (Pulse lavage);Packing (Saline gauze) 02/28/20 1358   Santyl applied to wound bed prior to applying dressing.     Hydrotherapy Pulsed lavage therapy - wound location: Sacrum Pulsed Lavage with Suction (psi): 12 psi Pulsed Lavage with Suction - Normal  Saline Used: 1000 mL Pulsed Lavage Tip: Tip with splash shield Selective Debridement Selective Debridement - Location: Sacrum Selective Debridement - Tools Used: Forceps;Scalpel Selective Debridement - Tissue Removed: Eschar and non-viable tissue   Wound Assessment and Plan  Wound Therapy - Assess/Plan/Recommendations Wound Therapy - Clinical Statement: Pt presents to hydrotherapy with an unstagable sacral pressure injury. We were able to initiate debridement this session and pt tolerated well with RN and ECMO specialist present throughout. This patient will benefit from continued hydrotherapy for selective removal of unviable tissue, to decrease bioburden and promote wound bed healing.  Wound Therapy - Functional Problem List: Decreased generaly mobility, on ECMO, CRRT, vent Factors Delaying/Impairing Wound Healing: Immobility;Multiple medical problems Hydrotherapy Plan: Debridement;Dressing change;Patient/family education;Pulsatile lavage with suction Wound Therapy - Frequency: 6X / week Wound Therapy - Follow Up Recommendations: Skilled nursing facility (vs LTACH) Wound Plan: See above  Wound Therapy Goals- Improve the function of patient's integumentary system by progressing the wound(s) through the phases of wound healing (inflammation - proliferation - remodeling) by: Decrease Necrotic Tissue to: 20% Decrease Necrotic Tissue - Progress: Progressing toward goal Increase Granulation Tissue to: 80% Increase Granulation Tissue - Progress: Progressing toward goal Goals/treatment plan/discharge plan were made with and agreed upon by patient/family: Yes Time For Goal Achievement: 7 days Wound Therapy - Potential for Goals: Fair  Goals will be updated until maximal potential achieved or discharge criteria met.  Discharge criteria: when goals achieved, discharge from hospital, MD decision/surgical intervention, no progress towards goals, refusal/missing three consecutive treatments without  notification or medical reason.  GP     Thelma Comp 02/29/2020, 1:06 PM   Rolinda Roan, PT, DPT Acute Rehabilitation Services Pager: [phone] Office: [phone]

## 2020-02-29 NOTE — Progress Notes (Signed)
Nutrition Follow-up  DOCUMENTATION CODES:   Morbid obesity  INTERVENTION:   Tube Feeding via J-port of G-J tube:  Pivot 1.5 at 65 ml/hr Pro-Source TF 45 mL QID Provides 2500 kcals, 190 g of protein and 1170 mL of free water  Meets 100% estimated calorie and protein needs  Resume Juven BID, each packet provides 80 calories, 8 grams of carbohydrate, 2.5  grams of protein (collagen), 7 grams of L-arginine and 7 grams of L-glutamine; supplement contains CaHMB, Vitamins C, E, B12 and Zinc to promote wound healing  Awaiting zinc, copper and thiamine labs  NUTRITION DIAGNOSIS:   Increased nutrient needs related to acute illness (COVID-19 infection) as evidenced by estimated needs.  Being addressed via TF   GOAL:   Patient will meet greater than or equal to 90% of their needs  Progressing  MONITOR:   Vent status, TF tolerance, Labs, Weight trends  REASON FOR ASSESSMENT:   Ventilator, Consult Enteral/tube feeding initiation and management  ASSESSMENT:   54 y.o. female with medical history of type 2 DM and obesity. She presented to the ED at [facility] with SOB diarrhea x1 week, and cough. She was dx with COVID-19 one week prior to presentation to the ED.  8/9- tx from [facility], s/p VV ECMO cannulation 8/13- CT head with multiple areas of [place] 8/16- extubated, gastric Cortrak placed  8/22- start CRRT 8/30- trach 9/3- large vomiting episode, Cortrak advanced 2nd portion duodenum 9/6- stop CRRT, Cortrakcoiled in stomach 9/7- first iHD 9/9- CRRT restarted 9/10- G-J tube placed  Pt remains on vent support via trach, VV ECMO day 18, remains on CRRT  Pt working with PT, attempting inline PMV as able with SLP  Tolerating Pivot 1.5 at 65 ml/hr via J-port of G-J tube  Vitamin/MIneral Profile:  Copper- pending Folate- 11.3 (wdl) Thiamine-  pending Vitamin C-  1.8 (wdl)-pt has been on Vit C 500 mg (continue) plus Rena-Vite Zinc- pending Selenium-  122  (wdl)  Noted pictures of Sacral wound from 9/21 and 9/22. Pt receiving hydrotherapy  Current wt 112.8 kg; admit weight around 133 kg. Net negative 4 L. Pt has experienced true weight loss since admission (LOS day 52)  Labs: CBGs 153-207 (ICU goal 140-180), phosphorus 2.0 (L) Meds: ss novolog, levemir, novolog q 4 hours, tradjenta, reglan, Rena-vit  Diet Order:   Diet Order    None      EDUCATION NEEDS:   No education needs have been identified at this time  Skin:  Skin Assessment: Skin Integrity Issues: Skin Integrity Issues:: Unstageable, Stage II Stage II: back Unstageable: sacrum Other: MASD- breast skin tears-face/chest/chin/buttocks  Last BM:  9/23  Height:   Ht Readings from Last 1 Encounters:  02/08/20 5\' 4"  (1.626 m)    Weight:   Wt Readings from Last 1 Encounters:  02/29/20 112.8 kg    Ideal Body Weight:  54.5 kg  BMI:  Body mass index is 42.69 kg/m.  Estimated Nutritional Needs:   Kcal:  2423-5361 kcal  Protein:  170-210 grams  Fluid:  >/= 2 L/day   Kerman Passey MS, RDN, LDN, CNSC Registered Dietitian III Clinical Nutrition RD Pager and On-Call Pager Number Located in [place]  '

## 2020-02-29 NOTE — Procedures (Signed)
Extracorporeal support note  ECLS support day: Cannulated 01/22/2020, Day 45 Indication: COVID 19 ARDS   Configuration: ECMO Mode: VV  Drainage cannula: RIJ [place]  Return cannula: Same   Pump speed: 4000 rpm  Pump flow: Flow (LPM): 4.84  Pump used: ECMO Device: Centrimag  Oxygenator: Quadrox O2 blender: 100% Sweep gas: 4.5L  ABG    Component Value Date/Time   PHART 7.334 (L) 02/29/2020 0803   PCO2ART 50.3 (H) 02/29/2020 0803   PO2ART 79 (L) 02/29/2020 0803   HCO3 26.8 02/29/2020 0803   TCO2 28 02/29/2020 0803   ACIDBASEDEF 2.0 02/25/2020 2026   O2SAT 95.0 02/29/2020 0803    Circuit check: no clots in oxygenator  Case discussed with ECMO specialist at bedside Anticoagulant: Heparin Anticoagulation targets: Anticoagulation Goal: hep 0.2-0.3  Changes in support: Continue sweep weans  Anticipated goals/duration of support: Bridge to recovery  All issues highlighted discussed on multidisciplinary team rounding.  Candee Furbish, MD [facility] 02/29/2020 10:35 AM

## 2020-02-29 NOTE — Progress Notes (Signed)
Physical Therapy Treatment Patient Details Name: Alisha Stephenson MRN: [id number] DOB: Oct 06, 1965 Today's Date: 02/29/2020    History of Present Illness Pt is 54 yo admitted 8/2 with hypoxemia due to Covid 19 PNA. 8/3 intubated. 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 CRRT 8/22-9/6, restarted 9/9. Reintubated 8/24 with ECMO circuit changed, 8/30 trach. 9/10 PEG tube. 9/17 circuit change. PMHx: DM, diverticulitis, Asthma    PT Comments    Pt with decreased alertness and participation this session with increased delay in command following. Pt continues to move all extremities with decreased bil dorsiflexion but slowly improving. Pt able to tilt to 35 degrees today with ability to fully lift shoulders and upper trunk off surface on cue x 6 trial. Pt educated for continued HEP and progression.   SpO2 95-98% on 40% pressure control   Follow Up Recommendations  LTACH;Supervision/Assistance - 24 hour     Equipment Recommendations  Other (comment) (TBD)    Recommendations for Other Services       Precautions / Restrictions Precautions Precautions: Other (comment) Precaution Comments: ECMO with RIJ access anchors at right shoulder and chest, right necrotic thumb and index finger, bruised/necrotic left hand, trach, vent, CRRT, sacral wound, Gtube to suction, Jtube for nutrition    Mobility  Bed Mobility Overal bed mobility: Needs Assistance             General bed mobility comments: +2 total for slide to [facility], transition supine<>stand via tilt feature of bed  Transfers                    Ambulation/Gait                 Stairs             Wheelchair Mobility    Modified Rankin (Stroke Patients Only) Modified Rankin (Stroke Patients Only) Pre-Morbid Rankin Score: No symptoms Modified Rankin: Severe disability     Balance Overall balance assessment: Needs assistance           Standing balance-Leahy  Scale: Poor Standing balance comment: pt with ability to pull trunk off surface with tendency for right lean in standing                            Cognition Arousal/Alertness: Awake/alert Behavior During Therapy: Flat affect Overall Cognitive Status: Impaired/Different from baseline Area of Impairment: Safety/judgement;Following commands                       Following Commands: Follows one step commands with increased time;Follows one step commands inconsistently Safety/Judgement: Decreased awareness of deficits;Decreased awareness of safety     General Comments: pt with increased delay in command following, not as talkative or engaged this session but did receive pain medication prior to session      Exercises General Exercises - Lower Extremity Short Arc [place];Both;Supine;10 reps Heel Slides: PROM;AAROM;10 reps;Supine (PROM on RLE) Toe Raises: AAROM;Both;10 reps;Supine Heel Raises: AAROM;Both;10 reps;Supine    General Comments        Pertinent Vitals/Pain Pain Score: 4  Pain Location: right knee with ROM Pain Descriptors / Indicators: Grimacing;Sore;Guarding Pain Intervention(s): Limited activity within patient's tolerance;Monitored during session;Premedicated before session;Repositioned    Home Living                      Prior Function  PT Goals (current goals can now be found in the care plan section) Progress towards PT goals: Progressing toward goals    Frequency    Min 3X/week      PT Plan Current plan remains appropriate    Co-evaluation              AM-PAC PT "6 Clicks" Mobility   Outcome Measure  Help needed turning from your back to your side while in a flat bed without using bedrails?: Total Help needed moving from lying on your back to sitting on the side of a flat bed without using bedrails?: Total Help needed moving to and from a bed to a chair (including a wheelchair)?: Total Help  needed standing up from a chair using your arms (e.g., wheelchair or bedside chair)?: Total Help needed to walk in hospital room?: Total Help needed climbing 3-5 steps with a railing? : Total 6 Click Score: 6    End of Session   Activity Tolerance: Patient tolerated treatment well Patient left: in bed;with family/visitor present;Other (comment) (bil PRAFO)   PT Visit Diagnosis: Other abnormalities of gait and mobility (R26.89);Muscle weakness (generalized) (M62.81)     Time: 2197-5883 PT Time Calculation (min) (ACUTE ONLY): 46 min  Charges:  $Therapeutic Exercise: 8-22 mins $Therapeutic Activity: 23-37 mins                     Ruben Pyka P, PT Acute Rehabilitation Services Pager: [phone] Office: [phone]    Jaidy Cottam B Tanaia Hawkey 02/29/2020, 1:45 PM

## 2020-02-29 NOTE — Progress Notes (Signed)
Patient ID: Alisha Stephenson, female   DOB: Jul 14, 1965, 54 y.o.   MRN: [id number]    Advanced Heart Failure Rounding Note   Subjective:    - 8/2 COVID + test - 8/9 Cannulated for VV ECMO - 8/13 with several areas of intracranial hemorrhage. Bival stopped.  - 8/14 CT no change in [place]. Increased edema - 8/16 Extubated - 8/16 Head CT stable bleed - 8/22 CVVHD started - 8/24 reintubated - 8/24 circuit changed - 8/30 trach - 9/7  Switched to iHD - 02/15/20 Switched back to CVVHD - 9/10 GJ tube placed - 9/17 Circuit change  Remains awake on vent via trach. Communicative. Following commands. Working with PT. Had hydrotherapy yesterday for large sacral wound.   Hgb down this am and give 1u RBC.   Remains on CVVHD. Pulling -25 to -50. Weight down 4 pounds.  Off pressors. Sweep at 5.5   CXR slightly better this am   ECMO  Flow 4.9 L RPM 4000 Sweep5.5  Labs: 7.32/52/93/96% 40% FiO2 TV 350cc Hgb7.7 PLT 90k LDH 268 Lactic acid2.3 Heparin 0.20  Objective:   Weight Range:  Vital Signs:   Temp:  [97.8 F (36.6 C)-98.4 F (36.9 C)] 98.3 F (36.8 C) (09/23 0840) Pulse Rate:  [91-105] 98 (09/23 0840) Resp:  [16-29] 29 (09/23 0809) BP: (82-129)/(42-66) 112/54 (09/23 0809) SpO2:  [85 %-100 %] 99 % (09/23 0840) Arterial Line BP: (92-133)/(42-64) 109/43 (09/23 0840) FiO2 (%):  [40 %] 40 % (09/23 0840) Weight:  [112.8 kg] 112.8 kg (09/23 0500) Last BM Date: 02/28/20  Weight change: Filed Weights   02/27/20 0500 02/28/20 0500 02/29/20 0500  Weight: 115.1 kg 114.7 kg 112.8 kg    Intake/Output:   Intake/Output Summary (Last 24 hours) at 02/29/2020 0851 Last data filed at 02/29/2020 0837 Gross per 24 hour  Intake 3104.29 ml  Output 2854 ml  Net 250.29 ml     Physical Exam: General:  Sitting up in bed awake following commands HEENT: normal Neck: supple. RIJ ECMO LIC trialysis + trachCarotids 2+ bilat; no bruits. No lymphadenopathy or thryomegaly appreciated. Cor:  PMI nondisplaced. Regular rate & rhythm. No rubs, gallops or murmurs. Lungs: clear with decreased breath sounds Abdomen: soft, nontender, nondistended. obese + PEG Extremities: no cyanosis, clubbing, rash, edema Neuro: alert & orientedx3, cranial nerves grossly intact. moves all 4 extremities w/o difficulty. Affect pleasant   Telemetry: Sinus 90-105Personally reviewed   Labs: Basic Metabolic Panel: Recent Labs  Lab 02/25/20 0500 02/25/20 0802 02/26/20 0417 02/26/20 0821 02/27/20 0403 02/27/20 0418 02/27/20 1643 02/27/20 2003 02/28/20 0435 02/28/20 0446 02/28/20 1618 02/28/20 1618 02/28/20 1626 02/28/20 1732 02/28/20 1952 02/29/20 0411 02/29/20 0423  NA 136   < > 135   < > 134*   < > 134*   < > 136   < > 134*   < > 134* 134* 134* 134* 136  K 4.5   < > 4.5   < > 4.8   < > 5.1   < > 4.8   < > 5.0   < > 5.0 5.4* 5.3* 4.5 4.7  CL 103   < > 100   < > 101  --  98  --  101  --  99  --   --   --   --  99  --   CO2 24   < > 25   < > 25  --  24  --  25  --  26  --   --   --   --  25  --   GLUCOSE 226*   < > 290*   < > 208*  --  271*  --  221*  --  235*  --   --   --   --  174*  --   BUN 37*   < > 36*   < > 34*  --  37*  --  39*  --  37*  --   --   --   --  37*  --   CREATININE 0.99   < > 0.95   < > 1.01*  --  1.02*  --  1.06*  --  1.03*  --   --   --   --  1.03*  --   CALCIUM 8.8*   < > 8.9   < > 8.8*   < > 8.7*   < > 9.0  --  8.8*  --   --   --   --  8.8*  --   MG 2.8*  --  2.7*  --  2.7*  --   --   --  2.6*  --   --   --   --   --   --  2.8*  --   PHOS 2.4*   < > 2.6  --  1.9*  --  3.5  --  2.4*  --  4.0  --   --   --   --  2.0*  --    < > = values in this interval not displayed.    Liver Function Tests: Recent Labs  Lab 02/26/20 0417 02/27/20 0403 02/27/20 1643 02/28/20 1618 02/29/20 0411  ALBUMIN 3.4* 3.2* 3.2* 3.0* 3.1*   No results for input(s): LIPASE, AMYLASE in the last 168 hours. No results for input(s): AMMONIA in the last 168 hours.  CBC: Recent Labs  Lab  02/26/20 1648 02/26/20 1808 02/27/20 0403 02/27/20 0418 02/28/20 0435 02/28/20 0446 02/28/20 1732 02/28/20 1753 02/28/20 1952 02/29/20 0411 02/29/20 0423  WBC 8.5  --  6.7  --  8.8  --   --  12.0*  --  7.6  --   NEUTROABS  --   --   --   --   --   --   --  9.5*  --   --   --   HGB 8.7*   < > 8.1*   < > 8.2*   < > 9.2* 8.8* 8.5* 7.7* 8.2*  HCT 29.7*   < > 26.6*   < > 26.9*   < > 27.0* 28.9* 25.0* 25.0* 24.0*  MCV 104.2*  --  102.7*  --  102.7*  --   --  102.5*  --  103.7*  --   PLT 91*  --  89*  --  96*  --   --  113*  --  90*  --    < > = values in this interval not displayed.    Cardiac Enzymes: No results for input(s): CKTOTAL, CKMB, CKMBINDEX, TROPONINI in the last 168 hours.  BNP: BNP (last 3 results) No results for input(s): BNP in the last 8760 hours.  ProBNP (last 3 results) No results for input(s): PROBNP in the last 8760 hours.    Other results:  Imaging: DG CHEST PORT 1 VIEW  Result Date: 02/29/2020 CLINICAL DATA:  ARDS.  Tracheostomy.  ECMO. EXAM: PORTABLE CHEST 1 VIEW COMPARISON:  02/28/2020. FINDINGS: Tracheostomy tube, left IJ line, right PICC line, ECMO catheter stable position.  Stable cardiomegaly. Diffuse severe bilateral pulmonary infiltrates again noted without interim change. No prominent pleural effusion. No pneumothorax. Prior cervical spine fusion. IMPRESSION: 1.  Lines and tubes including ECMO device in stable position. 2.  Stable cardiomegaly. 3. Diffuse severe bilateral pulmonary infiltrates again noted without interim change. Electronically Signed   By: Marcello Moores  Register   On: 02/29/2020 06:52   DG CHEST PORT 1 VIEW  Result Date: 02/28/2020 CLINICAL DATA:  Trach, ARDS, ECMO EXAM: PORTABLE CHEST 1 VIEW COMPARISON:  02/27/2020 FINDINGS: Tracheostomy, left dialysis catheter and ECMO device remain in place, unchanged. Diffuse bilateral airspace disease. Probable loculated right pleural effusion. Mild cardiomegaly. Right PICC line remains in place. No  significant change since prior study. IMPRESSION: No interval change. Electronically Signed   By: Rolm Baptise M.D.   On: 02/28/2020 08:12     Medications:     Scheduled Medications: . amiodarone  200 mg Per Tube Daily  . vitamin C  500 mg Per Tube Daily  . chlorhexidine gluconate (MEDLINE KIT)  15 mL Mouth Rinse BID  . Chlorhexidine Gluconate Cloth  6 each Topical Q0600  . clonazePAM  1 mg Per Tube Q6H  . collagenase   Topical Daily  . docusate  100 mg Per Tube BID  . feeding supplement (PROSource TF)  45 mL Per Tube QID  . Gerhardt's butt cream   Topical BID  . insulin aspart  0-15 Units Subcutaneous Q4H  . insulin aspart  15 Units Subcutaneous Q4H  . insulin detemir  50 Units Subcutaneous BID  . linagliptin  5 mg Per Tube Daily  . mouth rinse  15 mL Mouth Rinse 10 times per day  . melatonin  9 mg Per Tube QHS  . metoCLOPramide (REGLAN) injection  5 mg Intravenous Q8H  . midodrine  10 mg Per Tube TID  . multivitamin  1 tablet Per Tube QHS  . oxyCODONE  10 mg Per Tube Q6H  . pantoprazole (PROTONIX) IV  40 mg Intravenous BID  . pneumococcal 23 valent vaccine  0.5 mL Intramuscular Tomorrow-1000  . polyethylene glycol  17 g Per Tube Daily  . QUEtiapine  100 mg Per Tube QHS  . sennosides  5 mL Per Tube BID  . zinc sulfate  220 mg Per Tube Daily    Infusions: .  prismasol BGK 4/2.5 400 mL/hr at 02/28/20 2108  .  prismasol BGK 4/2.5 200 mL/hr at 02/28/20 2108  . sodium chloride 500 mL (02/11/20 2215)  . sodium chloride Stopped (02/25/20 0739)  . sodium chloride Stopped (02/24/20 2312)  . sodium chloride    . albumin human 12.5 g (02/29/20 0027)  . feeding supplement (PIVOT 1.5 CAL) 65 mL/hr at 02/29/20 0700  . heparin 10,000 units/ 20 mL infusion syringe 500 Units/hr (02/28/20 1942)  . heparin 1,150 Units/hr (02/29/20 0800)  . prismasol BGK 4/2.5 1,500 mL/hr at 02/29/20 0401    PRN Medications: sodium chloride, sodium chloride, sodium chloride, sodium chloride,  acetaminophen (TYLENOL) oral liquid 160 mg/5 mL, albumin human, albuterol, alteplase, alum & mag hydroxide-simeth, dextrose, fentaNYL (SUBLIMAZE) injection, guaiFENesin-dextromethorphan, heparin, heparin, heparin, hydrALAZINE, HYDROmorphone (DILAUDID) injection, hydrOXYzine, lidocaine (PF), lidocaine-prilocaine, midazolam, ondansetron **OR** ondansetron (ZOFRAN) IV, pentafluoroprop-tetrafluoroeth, silver nitrate applicators, Zinc Oxide   Assessment/Plan:   1. Acute hypoxic/hypercapneic respiratory failure in setting of severe COVID PNA/ARDS -> VV ECMO - admit 8/2 - intubation 8/3 - has received actmera (compelted 8/2), remdesivir (completed 8/6) and steroids - Cannulated for VV ECMO on 8/9 - Extubated 8/16. Reintubated 8/24 -  s/p trach 8/30. - Circuit changed 8/24 due to concern for hemolysis and worsening oxygenation. Repeat circuit change 9/17 - CVVHD started 8/22 for volume removal and uremia. Remains anuric. CXR worse on 9/9 so switched back to CVVHD. Now keeping at -25-50/hr. D/w Renal. No change.  - Off IV sedation. Continue melatonin and seroquel for sleep at night. Adjusted to help with restlessness and sundowning at night - Stable off pressors. On midodrine 10 tid. Has wide pulse pressure but no AI on echo.  - Lung function returning very slowly. I suspect major issue is respiratory muscle weakness and poor lung compliance but still with diffuse infiltrates. Continue to wean sweep. Now down to 5.5  Suspect baseline pCO2 is in 50s. Ok to let it drift up a bit. Continue to keep her mildly acidotic and encourage respiratory muscle training. Continue to wean sweep today. If unable to get sweep down further will need chest CT in next few days  - Having some bleeding from trach aspirate - will culture - lactate up a bit. May be related to hydrotherapy. Will follow - Continue heparin with level 0.2-0.3 Discussed dosing with PharmD personally.   2. Enterococcus sepsis - Finished high-dose  zosyn on 9/7 per ID - Eraxis added on 8/26 with yeast in BAL 8/24 (ended 9/2)  - F/u cx 9/7 No growth  3. Intracranial hemorrhage - ? Septic emboli - repeat head CT on 8/14 with stable bleeds but increased edema - neurology has seen. Suspect significant long-term injury sustained. Will follow commands. Appears to have dense LUE weakness and possibly LLE - repeat head CT stable 8/16 - tolerating heparin. No change  4. Thrombocytopenia - PLTs stable around 90k - Continue to follow.   5. Morbid obesity - Body mass index is Body mass index is 42.69 kg/m.  6. Poorly controlled DM2 - HgBA1c 10.7 - IV insulin converted to SQ insulin on 9/15  7. PAF - intermittent episodes. Last 8/26 - Now on po. Quiescent   8. AKI/azotemia - CVVHD started on 8/22 - Down 70 pounds.  - transitioned to [facility] on 9/7 - switched back to CVVHD for better volume removal on 9/9. - new catheter placed 9/9 - will eventually need tunneled access - Keep UF rate -25 to - 50 as above. Weight stable.  - D/w Renal  9. Emesis - Has GJ tube now, tolerating TFs  10. Sacral decub  - large unstageable. Seen by [facility]. Not candidate for debridement at this point.  - for hydrotherapy day #2 today  11. Necrotic fingers - VVS following - suspect will auto amputate  12. Anemia - Transfuse hgb < 8   CRITICAL CARE Performed by: Glori Bickers  Total critical care time: 35 minutes  Critical care time was exclusive of separately billable procedures and treating other patients.  Critical care was necessary to treat or prevent imminent or life-threatening deterioration.  Critical care was time spent personally by me (independent of midlevel providers or residents) on the following activities: development of treatment plan with patient and/or surrogate as well as nursing, discussions with consultants, evaluation of patient's response to treatment, examination of patient, obtaining history from patient or surrogate,  ordering and performing treatments and interventions, ordering and review of laboratory studies, ordering and review of radiographic studies, pulse oximetry and re-evaluation of patient's condition.   Length of Stay: 63   Glori Bickers  MD 02/29/2020, 8:51 AM  Advanced Heart Failure Team Pager [phone] (M-F; 7a - 4p)  Please contact  [facility] for night-coverage after hours (4p -7a ) and weekends on [email or web address]

## 2020-03-01 ENCOUNTER — Inpatient Hospital Stay (HOSPITAL_COMMUNITY): Payer: PRIVATE HEALTH INSURANCE

## 2020-03-01 LAB — FIBRINOGEN: Fibrinogen: 734 mg/dL — ABNORMAL HIGH (ref 210–475)

## 2020-03-01 LAB — GLUCOSE, CAPILLARY
Glucose-Capillary: 142 mg/dL — ABNORMAL HIGH (ref 70–99)
Glucose-Capillary: 163 mg/dL — ABNORMAL HIGH (ref 70–99)
Glucose-Capillary: 170 mg/dL — ABNORMAL HIGH (ref 70–99)
Glucose-Capillary: 194 mg/dL — ABNORMAL HIGH (ref 70–99)
Glucose-Capillary: 230 mg/dL — ABNORMAL HIGH (ref 70–99)
Glucose-Capillary: 268 mg/dL — ABNORMAL HIGH (ref 70–99)

## 2020-03-01 LAB — POCT I-STAT 7, (LYTES, BLD GAS, ICA,H+H)
Acid-Base Excess: 0 mmol/L (ref 0.0–2.0)
Acid-Base Excess: 0 mmol/L (ref 0.0–2.0)
Acid-base deficit: 2 mmol/L (ref 0.0–2.0)
Acid-base deficit: 3 mmol/L — ABNORMAL HIGH (ref 0.0–2.0)
Acid-base deficit: 2 mmol/L (ref 0.0–2.0)
Bicarbonate: 26.6 mmol/L (ref 20.0–28.0)
Bicarbonate: 26 mmol/L (ref 20.0–28.0)
Bicarbonate: 24.5 mmol/L (ref 20.0–28.0)
Bicarbonate: 24.9 mmol/L (ref 20.0–28.0)
Bicarbonate: 25.6 mmol/L (ref 20.0–28.0)
Calcium, Ion: 1.23 mmol/L (ref 1.15–1.40)
Calcium, Ion: 1.23 mmol/L (ref 1.15–1.40)
Calcium, Ion: 1.21 mmol/L (ref 1.15–1.40)
Calcium, Ion: 1.2 mmol/L (ref 1.15–1.40)
Calcium, Ion: 1.17 mmol/L (ref 1.15–1.40)
HCT: 28 % — ABNORMAL LOW (ref 36.0–46.0)
HCT: 31 % — ABNORMAL LOW (ref 36.0–46.0)
HCT: 31 % — ABNORMAL LOW (ref 36.0–46.0)
HCT: 32 % — ABNORMAL LOW (ref 36.0–46.0)
HCT: 29 % — ABNORMAL LOW (ref 36.0–46.0)
Hemoglobin: 9.5 g/dL — ABNORMAL LOW (ref 12.0–15.0)
Hemoglobin: 10.5 g/dL — ABNORMAL LOW (ref 12.0–15.0)
Hemoglobin: 10.5 g/dL — ABNORMAL LOW (ref 12.0–15.0)
Hemoglobin: 10.9 g/dL — ABNORMAL LOW (ref 12.0–15.0)
Hemoglobin: 9.9 g/dL — ABNORMAL LOW (ref 12.0–15.0)
O2 Saturation: 94 %
O2 Saturation: 90 %
O2 Saturation: 90 %
O2 Saturation: 94 %
O2 Saturation: 95 %
Patient temperature: 98.4
Patient temperature: 98.9
Patient temperature: 98
Patient temperature: 99.5
Patient temperature: 98.2
Potassium: 4.5 mmol/L (ref 3.5–5.1)
Potassium: 4.8 mmol/L (ref 3.5–5.1)
Potassium: 4.3 mmol/L (ref 3.5–5.1)
Potassium: 4.4 mmol/L (ref 3.5–5.1)
Potassium: 4.3 mmol/L (ref 3.5–5.1)
Sodium: 133 mmol/L — ABNORMAL LOW (ref 135–145)
Sodium: 133 mmol/L — ABNORMAL LOW (ref 135–145)
Sodium: 135 mmol/L (ref 135–145)
Sodium: 134 mmol/L — ABNORMAL LOW (ref 135–145)
Sodium: 135 mmol/L (ref 135–145)
TCO2: 28 mmol/L (ref 22–32)
TCO2: 28 mmol/L (ref 22–32)
TCO2: 26 mmol/L (ref 22–32)
TCO2: 26 mmol/L (ref 22–32)
TCO2: 27 mmol/L (ref 22–32)
pCO2 arterial: 52.5 mmHg — ABNORMAL HIGH (ref 32.0–48.0)
pCO2 arterial: 61.6 mmHg — ABNORMAL HIGH (ref 32.0–48.0)
pCO2 arterial: 52.6 mmHg — ABNORMAL HIGH (ref 32.0–48.0)
pCO2 arterial: 50.4 mmHg — ABNORMAL HIGH (ref 32.0–48.0)
pCO2 arterial: 47.4 mmHg (ref 32.0–48.0)
pH, Arterial: 7.312 — ABNORMAL LOW (ref 7.350–7.450)
pH, Arterial: 7.235 — ABNORMAL LOW (ref 7.350–7.450)
pH, Arterial: 7.274 — ABNORMAL LOW (ref 7.350–7.450)
pH, Arterial: 7.305 — ABNORMAL LOW (ref 7.350–7.450)
pH, Arterial: 7.34 — ABNORMAL LOW (ref 7.350–7.450)
pO2, Arterial: 76 mmHg — ABNORMAL LOW (ref 83.0–108.0)
pO2, Arterial: 72 mmHg — ABNORMAL LOW (ref 83.0–108.0)
pO2, Arterial: 67 mmHg — ABNORMAL LOW (ref 83.0–108.0)
pO2, Arterial: 81 mmHg — ABNORMAL LOW (ref 83.0–108.0)
pO2, Arterial: 80 mmHg — ABNORMAL LOW (ref 83.0–108.0)

## 2020-03-01 LAB — RENAL FUNCTION PANEL
Albumin: 3.1 g/dL — ABNORMAL LOW (ref 3.5–5.0)
Albumin: 3.1 g/dL — ABNORMAL LOW (ref 3.5–5.0)
Anion gap: 12 (ref 5–15)
Anion gap: 11 (ref 5–15)
BUN: 41 mg/dL — ABNORMAL HIGH (ref 6–20)
BUN: 42 mg/dL — ABNORMAL HIGH (ref 6–20)
CO2: 24 mmol/L (ref 22–32)
CO2: 23 mmol/L (ref 22–32)
Calcium: 8.8 mg/dL — ABNORMAL LOW (ref 8.9–10.3)
Calcium: 8.8 mg/dL — ABNORMAL LOW (ref 8.9–10.3)
Chloride: 96 mmol/L — ABNORMAL LOW (ref 98–111)
Chloride: 99 mmol/L (ref 98–111)
Creatinine, Ser: 1.04 mg/dL — ABNORMAL HIGH (ref 0.44–1.00)
Creatinine, Ser: 1.08 mg/dL — ABNORMAL HIGH (ref 0.44–1.00)
GFR calc Af Amer: >60 mL/min (ref >60)
GFR calc Af Amer: >60 mL/min (ref >60)
GFR calc non Af Amer: >60 mL/min (ref >60)
GFR calc non Af Amer: 58 mL/min — ABNORMAL LOW (ref >60)
Glucose, Bld: 179 mg/dL — ABNORMAL HIGH (ref 70–99)
Glucose, Bld: 220 mg/dL — ABNORMAL HIGH (ref 70–99)
Phosphorus: 3.2 mg/dL (ref 2.5–4.6)
Phosphorus: 2.9 mg/dL (ref 2.5–4.6)
Potassium: 4.4 mmol/L (ref 3.5–5.1)
Potassium: 4.4 mmol/L (ref 3.5–5.1)
Sodium: 132 mmol/L — ABNORMAL LOW (ref 135–145)
Sodium: 133 mmol/L — ABNORMAL LOW (ref 135–145)

## 2020-03-01 LAB — HEPARIN LEVEL (UNFRACTIONATED)
Heparin Unfractionated: 0.14 IU/mL — ABNORMAL LOW (ref 0.30–0.70)
Heparin Unfractionated: 0.22 IU/mL — ABNORMAL LOW (ref 0.30–0.70)

## 2020-03-01 LAB — CBC
HCT: 29.8 % — ABNORMAL LOW (ref 36.0–46.0)
HCT: 32.1 % — ABNORMAL LOW (ref 36.0–46.0)
Hemoglobin: 9.2 g/dL — ABNORMAL LOW (ref 12.0–15.0)
Hemoglobin: 9.9 g/dL — ABNORMAL LOW (ref 12.0–15.0)
MCH: 30.6 pg (ref 26.0–34.0)
MCH: 30.4 pg (ref 26.0–34.0)
MCHC: 30.9 g/dL (ref 30.0–36.0)
MCHC: 30.8 g/dL (ref 30.0–36.0)
MCV: 99 fL (ref 80.0–100.0)
MCV: 98.5 fL (ref 80.0–100.0)
Platelets: 97 10*3/uL — ABNORMAL LOW (ref 150–400)
Platelets: 104 10*3/uL — ABNORMAL LOW (ref 150–400)
RBC: 3.01 MIL/uL — ABNORMAL LOW (ref 3.87–5.11)
RBC: 3.26 MIL/uL — ABNORMAL LOW (ref 3.87–5.11)
RDW: 20.7 % — ABNORMAL HIGH (ref 11.5–15.5)
RDW: 20.5 % — ABNORMAL HIGH (ref 11.5–15.5)
WBC: 8.1 10*3/uL (ref 4.0–10.5)
WBC: 10.3 10*3/uL (ref 4.0–10.5)
nRBC: 0.5 % — ABNORMAL HIGH (ref 0.0–0.2)
nRBC: 0.3 % — ABNORMAL HIGH (ref 0.0–0.2)

## 2020-03-01 LAB — LACTIC ACID, PLASMA
Lactic Acid, Venous: 1.5 mmol/L (ref 0.5–1.9)
Lactic Acid, Venous: 2 mmol/L (ref 0.5–1.9)

## 2020-03-01 LAB — LACTATE DEHYDROGENASE: LDH: 281 U/L — ABNORMAL HIGH (ref 98–192)

## 2020-03-01 LAB — VITAMIN B1: Vitamin B1 (Thiamine): 183.6 nmol/L (ref 66.5–200.0)

## 2020-03-01 LAB — MAGNESIUM: Magnesium: 2.8 mg/dL — ABNORMAL HIGH (ref 1.7–2.4)

## 2020-03-01 MED ORDER — INSULIN DETEMIR 100 UNIT/ML ~~LOC~~ SOLN
10.0000 [IU] | Freq: Once | SUBCUTANEOUS | Status: AC
  Administered 2020-03-01: 10 [IU] via SUBCUTANEOUS
  Filled 2020-03-01: qty 0.1

## 2020-03-01 MED ORDER — SODIUM CHLORIDE 0.9 % IV SOLN
2.0000 g | Freq: Three times a day (TID) | INTRAVENOUS | Status: DC
  Filled 2020-03-01: qty 2

## 2020-03-01 MED ORDER — INSULIN DETEMIR 100 UNIT/ML ~~LOC~~ SOLN
60.0000 [IU] | Freq: Two times a day (BID) | SUBCUTANEOUS | Status: DC
  Administered 2020-03-01 – 2020-03-03 (×5): 60 [IU] via SUBCUTANEOUS
  Filled 2020-03-01 (×8): qty 0.6

## 2020-03-01 MED ORDER — SODIUM CHLORIDE 0.9 % IV SOLN
2.0000 g | Freq: Once | INTRAVENOUS | Status: AC
  Administered 2020-03-01: 2 g via INTRAVENOUS
  Filled 2020-03-01: qty 2

## 2020-03-01 MED ORDER — SODIUM CHLORIDE 0.9 % IV SOLN
2.0000 g | Freq: Three times a day (TID) | INTRAVENOUS | Status: DC
  Administered 2020-03-01 – 2020-03-04 (×8): 2 g via INTRAVENOUS
  Filled 2020-03-01 (×7): qty 2

## 2020-03-01 MED ORDER — VANCOMYCIN HCL 1250 MG/250ML IV SOLN
1250.0000 mg | INTRAVENOUS | Status: DC
  Administered 2020-03-02 – 2020-03-03 (×2): 1250 mg via INTRAVENOUS
  Filled 2020-03-01 (×3): qty 250

## 2020-03-01 MED ORDER — VANCOMYCIN HCL 2000 MG/400ML IV SOLN
2000.0000 mg | Freq: Once | INTRAVENOUS | Status: AC
  Administered 2020-03-01: 2000 mg via INTRAVENOUS
  Filled 2020-03-01: qty 400

## 2020-03-01 NOTE — Progress Notes (Signed)
NAME:  Alisha Stephenson, MRN:  [id number], DOB:  [birth year], LOS: 67 ADMISSION DATE:  01/16/2020, CONSULTATION DATE:  8/3 REFERRING MD:  Alfredia Ferguson, CHIEF COMPLAINT:  Dyspnea   Brief History   54 y/o female admitted on 8/2 with severe acute respiratory failure with hypoxemia due to COVID 19 pneumonia.  She developed symptoms 1 week prior to admission.  Past Medical History  DM2 Diverticulitis Gallstones Ovarian cyst NAFLD Asthma  Significant Hospital Events   8/2 admit 8/3 ICU transfer, intubated 8/4 prone, paralyze 8/9 significant desaturations today 8/9 VV ECMO cannulation 8/13 ICH 8/30 trach  Consults:  PCCM ECMO team General surgery 02/27/2020 for sacral wound care possible debridement  Procedures:  8/3 ETT > 8/30 8/30 8-0 shiley trach 8/3 PICC >  8/9 LIJ MML 8/9 RIJ [place] 71F  9/8 trialysis 9/10 PEG-J (IR)  Significant Diagnostic Tests:  7/31 CT head > NAICP 7/31 MRI/MRA brain > no acute changes, possibly small aneurysm ACOM 8/13 CT head> multiple areas of ICH  Micro Data:  8/2 blood > NG 8/2 SARS COV 2 > positive 8/4 resp > negative 8/4 urine >  8/12 blood > E. faecalis (pan-sensitive) 8/12 resp: staph aureus> MSSA 8/25 blood>> NG 8/24 BAL> yeast  Antimicrobials:  See fever tab for past abx Current: none  Interim history/subjective:  Lost some blood due to HD filter clotting and due to machine malfunction.  Given pRBCs to replace this. Otherwise no events, gets very somnolent with sedation for hydrotherapy.  Objective   Blood pressure (!) 101/56, pulse (!) 103, temperature 98.9 F (37.2 C), temperature source Oral, resp. rate 19, height _0  (1.626 m), weight 114 kg, SpO2 96 %.    Vent Mode: PCV FiO2 (%):  [40 %] 40 % Set Rate:  [15 bmp] 15 bmp PEEP:  [10 cmH20] 10 cmH20 Plateau Pressure:  [27 cmH20-34 cmH20] 27 cmH20   Intake/Output Summary (Last 24 hours) at 03/01/2020 0956 Last data filed at 03/01/2020 0900 Gross per 24 hour  Intake  2579.93 ml  Output 2717 ml  Net -137.07 ml   Filed Weights   02/28/20 0500 02/29/20 0500 03/01/20 0500  Weight: 114.7 kg 112.8 kg 114 kg    Examination:  GEN: Obese female on mechanical life support, VV ECMO in place, right IJ cannulation HEENT: NCAT, tracking appropriately, following commands Neck: Tracheostomy tube in place, CDI, ongoing bloody secretions, small oozing from inferior edge of trach, minor CV: Regular rate rhythm, S1-S2 PULM: Bilateral mechanically ventilated breath sounds GI: Soft nontender nondistended obese pannus EXT: Right upper extremity distal gangrene of the thumb and forefinger, stable NEURO: Alert oriented following commands PSYCH: Calm, interactive  Pressure Injury 02/14/20 Anus Medial Deep Tissue Pressure Injury - Purple or maroon localized area of discolored intact skin or blood-filled blister due to damage of underlying soft tissue from pressure and/or shear. (Active)  02/14/20 1300  Location: Anus  Location Orientation: Medial  Staging: Deep Tissue Pressure Injury - Purple or maroon localized area of discolored intact skin or blood-filled blister due to damage of underlying soft tissue from pressure and/or shear.  Wound Description (Comments):   Present on Admission: No     Pressure Injury Back Left;Mid Stage 2 -  Partial thickness loss of dermis presenting as a shallow open injury with a red, pink wound bed without slough. in skin fold on back (Active)     Location: Back  Location Orientation: Left;Mid  Staging: Stage 2 -  Partial thickness loss of dermis presenting as  a shallow open injury with a red, pink wound bed without slough.  Wound Description (Comments): in skin fold on back  Present on Admission: No     Pressure Injury 02/21/20 Sacrum Posterior Unstageable - Full thickness tissue loss in which the base of the injury is covered by slough (yellow, tan, gray, green or brown) and/or eschar (tan, brown or black) in the wound bed. Unstageable  Sacral Wound (Active)  02/21/20 1206  Location: Sacrum  Location Orientation: Posterior  Staging: Unstageable - Full thickness tissue loss in which the base of the injury is covered by slough (yellow, tan, gray, green or brown) and/or eschar (tan, brown or black) in the wound bed.  Wound Description (Comments): Unstageable Sacral Wound  Present on Admission: No   Labs reviewed with ECMO team at bedside on multidisciplinary rounds  Chest x-ray: 02/27/2020: Bilateral persistent infiltrates, stable lines and tubes The patient's images have been independently reviewed by me.    ABG respiratory insufficiency Chemistries fine Lactate improved Hgb stable with transfusions WBC now normal Plts 113>>90>>97 LDH 284>>268>>281 Fibrinogen 675>>658>>734  Resolved Hospital Problem list   MSSA pneumonia Fungal pneumonia- Candida dublinensis Enterococcal bacteremia-recent blood cultures cleared Concern for possible embolic phenomenon- fingers and possibly CVAs were embolic  Assessment & Plan:   Critically ill due to Acute Hypxemic and AoC hypercapneic respiratory failure;  - likely underly OHS.   Requiring VV ECMO support and mechanical ventilation.  ARDS due to COVID 19 pneumonia, slowly resolving, now likely fibrotic stage disease  Tracheostomy in place -Continue VV ECMO support -Continue to titrate down sweep gas as tolerated -Discussed circuit with ECMO specialist at bedside -Continue head of bed elevated, ventilator associated pneumonia prophylaxis -Remains off of continuous sedation -Routine trach care. -Today: continue sweep wean trials, limit sedation for afternoon hydrotherapy session in attempt to preserve respiratory drive, discussion with Dr. Haroldine Laws: start broad spectrum abx given rise in lactate and f/u tracheal aspirate cultures, trying for -50cc/hr with CRRT  Hemoptysis- f/u tracheal aspirate culture  ICH, multifocal. L-sided weakness.  Critical illness  myopathy--improving slowly. -Continue SLP, PT and OT  Acute metabolic encephalopathy, improving Sundowning in the evenings -Continue delirium precautions -Encourage appropriate sleep-wake cycles. -Continue Seroquel nightly  Acute renal failure, severe uremia.  Anuric. -Continue CVVHD per nephrology  Hypotension. Acute cor pulmonale, improved -Remains off vasopressors at this time -Continue to observe  Hyperglycemia  -Continue insulin basal bolus to maintain CBG goal of 140-180 -Increase levemir 03/02/20  Pressure Injury, Skin, sacrum  -No plans for debridement at present - Continue local wound care, hydrotherapy and turning as able - WOC following  Best practice:  Diet: tube feeding Pain/Anxiety/Delirium protocol (if indicated): as above VAP protocol (if indicated): yes DVT prophylaxis: SCDs , heparin GI prophylaxis: pantoprazole Glucose control: ssi/basal Mobility: bed rest Code Status: limited Family Communication: husband at bedside and updated daily Disposition: ICU  This patient is critically ill with multiple organ system failure; which, requires frequent high complexity decision making, assessment, support, evaluation, and titration of therapies. This was completed through the application of advanced monitoring technologies and extensive interpretation of multiple databases. During this encounter critical care time was devoted to patient care services described in this note for 36 minutes.  Candee Furbish, MD [facility] 03/01/2020 9:56 AM

## 2020-03-01 NOTE — Progress Notes (Signed)
Patient ID: Alisha Stephenson, female   DOB: 15-Feb-1966, 54 y.o.   MRN: [id number]    Advanced Heart Failure Rounding Note   Subjective:    - 8/2 COVID + test - 8/9 Cannulated for VV ECMO - 8/13 with several areas of intracranial hemorrhage. Bival stopped.  - 8/14 CT no change in [place]. Increased edema - 8/16 Extubated - 8/16 Head CT stable bleed - 8/22 CVVHD started - 8/24 reintubated - 8/24 circuit changed - 8/30 trach - 9/7  Switched to iHD - 02/15/20 Switched back to CVVHD - 9/10 GJ tube placed - 9/17 Circuit change  Remains awake on vent via trach. Communicative. Following commands. Working with PT. Had hydrotherapy again yesterday for large sacral wound.   CVVHD circuit had to be changed twice yesterday and losts some blood. HAd chugging on ECMO circuit. Received 2u RBCs and albumin. CXR some worse today.   Off pressors. Sweep at 4.5 today  Still weak and with butt pain. Uncomfortable.   Wound and sputum with GNR and GPCs. Speciation pending.   ECMO  Flow 4.8 L RPM 4000 Sweep4.5  Labs: 7.31/53/76/94% 40% FiO2 TV 350-400cc Hgb9.2 PLT 90k -. 97k LDH 281 Lactic acid2.3 -> 1.5 Heparin 0.14  Objective:   Weight Range:  Vital Signs:   Temp:  [97.8 F (36.6 C)-98.9 F (37.2 C)] 98.9 F (37.2 C) (09/24 0800) Pulse Rate:  [90-103] 99 (09/24 1000) Resp:  [18-24] 19 (09/24 0740) BP: (77-121)/(42-77) 116/55 (09/24 1000) SpO2:  [91 %-100 %] 96 % (09/24 1000) Arterial Line BP: (61-130)/(42-63) 89/61 (09/24 1000) FiO2 (%):  [40 %] 40 % (09/24 0740) Weight:  [323 kg] 114 kg (09/24 0500) Last BM Date: 02/29/20  Weight change: Filed Weights   02/28/20 0500 02/29/20 0500 03/01/20 0500  Weight: 114.7 kg 112.8 kg 114 kg    Intake/Output:   Intake/Output Summary (Last 24 hours) at 03/01/2020 1104 Last data filed at 03/01/2020 1000 Gross per 24 hour  Intake 2704.08 ml  Output 2151 ml  Net 553.08 ml     Physical Exam: General:  Sitting up in bed awake following  commands HEENT: normal Neck: supple RIJ ecmo LIJ trialysis  Cor: PMI nondisplaced. Regular rate & rhythm. No rubs, gallops or murmurs. Lungs: clear with decreased BS  Abdomen: obese soft, nontender, nondistended. + PEG No hepatosplenomegaly. No bruits or masses. Good bowel sounds. Extremities: no cyanosis, clubbing, rash, edema + weak. Necrotic digits on R hand Neuro: alert following commands. Weak but improving Large sacral wound  Telemetry: Sinus 90-105 Personally reviewed    Labs: Basic Metabolic Panel: Recent Labs  Lab 02/26/20 0417 02/26/20 0821 02/27/20 0403 02/27/20 0418 02/28/20 0435 02/28/20 0446 02/28/20 1618 02/28/20 1626 02/29/20 0411 02/29/20 0423 02/29/20 1635 02/29/20 1645 02/29/20 1822 02/29/20 2007 03/01/20 0423 03/01/20 0427 03/01/20 0803  NA 135   < > 134*   < > 136   < > 134*   < > 134*   < > 132*   < > 132* 134* 133* 132* 133*  K 4.5   < > 4.8   < > 4.8   < > 5.0   < > 4.5   < > 5.0   < > 5.0 4.8 4.5 4.4 4.8  CL 100   < > 101   < > 101  --  99  --  99  --  98  --   --   --   --  96*  --   CO2 25   < >  25   < > 25  --  26  --  25  --  24  --   --   --   --  24  --   GLUCOSE 290*   < > 208*   < > 221*  --  235*  --  174*  --  239*  --   --   --   --  179*  --   BUN 36*   < > 34*   < > 39*  --  37*  --  37*  --  38*  --   --   --   --  41*  --   CREATININE 0.95   < > 1.01*   < > 1.06*  --  1.03*  --  1.03*  --  1.03*  --   --   --   --  1.04*  --   CALCIUM 8.9   < > 8.8*   < > 9.0   < > 8.8*   < > 8.8*  --  8.8*  --   --   --   --  8.8*  --   MG 2.7*  --  2.7*  --  2.6*  --   --   --  2.8*  --   --   --   --   --   --  2.8*  --   PHOS 2.6  --  1.9*   < > 2.4*  --  4.0  --  2.0*  --  3.2  --   --   --   --  3.2  --    < > = values in this interval not displayed.    Liver Function Tests: Recent Labs  Lab 02/27/20 1643 02/28/20 1618 02/29/20 0411 02/29/20 1635 03/01/20 0427  ALBUMIN 3.2* 3.0* 3.1* 3.1* 3.1*   No results for input(s): LIPASE,  AMYLASE in the last 168 hours. No results for input(s): AMMONIA in the last 168 hours.  CBC: Recent Labs  Lab 02/28/20 0435 02/28/20 0446 02/28/20 1753 02/28/20 1952 02/29/20 0411 02/29/20 0423 02/29/20 1635 02/29/20 1645 02/29/20 1822 02/29/20 2007 03/01/20 0423 03/01/20 0427 03/01/20 0803  WBC 8.8  --  12.0*  --  7.6  --  9.5  --   --   --   --  8.1  --   NEUTROABS  --   --  9.5*  --   --   --   --   --   --   --   --   --   --   HGB 8.2*   < > 8.8*   < > 7.7*   < > 9.1*   < > 9.5* 8.8* 9.5* 9.2* 10.5*  HCT 26.9*   < > 28.9*   < > 25.0*   < > 29.5*   < > 28.0* 26.0* 28.0* 29.8* 31.0*  MCV 102.7*  --  102.5*  --  103.7*  --  102.1*  --   --   --   --  99.0  --   PLT 96*  --  113*  --  90*  --  93*  --   --   --   --  97*  --    < > = values in this interval not displayed.    Cardiac Enzymes: No results for input(s): CKTOTAL, CKMB, CKMBINDEX, TROPONINI in the last 168 hours.  BNP: BNP (last 3 results) No results  for input(s): BNP in the last 8760 hours.  ProBNP (last 3 results) No results for input(s): PROBNP in the last 8760 hours.    Other results:  Imaging: DG CHEST PORT 1 VIEW  Result Date: 03/01/2020 CLINICAL DATA:  Respiratory failure with ECMO treatment EXAM: PORTABLE CHEST 1 VIEW COMPARISON:  February 29, 2020 FINDINGS: Tracheostomy catheter tip is 6.6 cm above the carina. ECMO catheter tip in inferior vena cava. Left jugular catheter tip is in the superior vena cava. No pneumothorax. Widespread airspace opacity persists throughout the lungs without change. There is cardiomegaly, stable. Pulmonary vascularity appears grossly within normal limits, limited in visualization due to the overlying airspace consolidation. No bone lesions. IMPRESSION: Stable widespread airspace opacity bilaterally. Stable cardiac prominence. Tube and catheter positions as described. No pneumothorax evident. Electronically Signed   By: Lowella Grip III M.D.   On: 03/01/2020 08:11    DG CHEST PORT 1 VIEW  Result Date: 02/29/2020 CLINICAL DATA:  ARDS.  Tracheostomy.  ECMO. EXAM: PORTABLE CHEST 1 VIEW COMPARISON:  02/28/2020. FINDINGS: Tracheostomy tube, left IJ line, right PICC line, ECMO catheter stable position. Stable cardiomegaly. Diffuse severe bilateral pulmonary infiltrates again noted without interim change. No prominent pleural effusion. No pneumothorax. Prior cervical spine fusion. IMPRESSION: 1.  Lines and tubes including ECMO device in stable position. 2.  Stable cardiomegaly. 3. Diffuse severe bilateral pulmonary infiltrates again noted without interim change. Electronically Signed   By: [place]   On: 02/29/2020 06:52     Medications:     Scheduled Medications: . amiodarone  200 mg Per Tube Daily  . vitamin C  500 mg Per Tube Daily  . chlorhexidine gluconate (MEDLINE KIT)  15 mL Mouth Rinse BID  . Chlorhexidine Gluconate Cloth  6 each Topical Q0600  . clonazePAM  1 mg Per Tube Q6H  . collagenase   Topical Daily  . docusate  100 mg Per Tube BID  . feeding supplement (PROSource TF)  45 mL Per Tube QID  . Gerhardt's butt cream   Topical BID  . insulin aspart  0-15 Units Subcutaneous Q4H  . insulin aspart  15 Units Subcutaneous Q4H  . insulin detemir  10 Units Subcutaneous Once  . insulin detemir  60 Units Subcutaneous BID  . linagliptin  5 mg Per Tube Daily  . mouth rinse  15 mL Mouth Rinse 10 times per day  . melatonin  9 mg Per Tube QHS  . metoCLOPramide (REGLAN) injection  5 mg Intravenous Q8H  . midodrine  10 mg Per Tube TID  . multivitamin  1 tablet Per Tube QHS  . oxyCODONE  10 mg Per Tube Q6H  . pantoprazole (PROTONIX) IV  40 mg Intravenous BID  . pneumococcal 23 valent vaccine  0.5 mL Intramuscular Tomorrow-1000  . polyethylene glycol  17 g Per Tube Daily  . QUEtiapine  100 mg Per Tube QHS  . sennosides  5 mL Per Tube BID  . zinc sulfate  220 mg Per Tube Daily    Infusions: .  prismasol BGK 4/2.5 400 mL/hr at 02/29/20 1954  .   prismasol BGK 4/2.5 200 mL/hr at 03/01/20 0909  . sodium chloride 250 mL (03/01/20 0844)  . sodium chloride Stopped (02/25/20 0739)  . sodium chloride Stopped (02/24/20 2312)  . sodium chloride    . albumin human 12.5 g (02/29/20 1850)  . ceFEPime (MAXIPIME) IV    . ceFEPime (MAXIPIME) IV    . feeding supplement (PIVOT 1.5 CAL) 65 mL/hr at  03/01/20 0700  . heparin 10,000 units/ 20 mL infusion syringe 500 Units/hr (03/01/20 0548)  . heparin 1,200 Units/hr (03/01/20 1000)  . prismasol BGK 2/2.5 dialysis solution 1,500 mL/hr at 03/01/20 0910    PRN Medications: sodium chloride, sodium chloride, sodium chloride, sodium chloride, acetaminophen (TYLENOL) oral liquid 160 mg/5 mL, albumin human, albuterol, alteplase, alum & mag hydroxide-simeth, dextrose, fentaNYL (SUBLIMAZE) injection, guaiFENesin-dextromethorphan, heparin, heparin, heparin, hydrALAZINE, HYDROmorphone (DILAUDID) injection, hydrOXYzine, lidocaine (PF), lidocaine-prilocaine, ondansetron **OR** ondansetron (ZOFRAN) IV, pentafluoroprop-tetrafluoroeth, silver nitrate applicators, Zinc Oxide   Assessment/Plan:   1. Acute hypoxic/hypercapneic respiratory failure in setting of severe COVID PNA/ARDS -> VV ECMO - admit 8/2 - intubation 8/3 - has received actmera (compelted 8/2), remdesivir (completed 8/6) and steroids - Cannulated for VV ECMO on 8/9 - Extubated 8/16. Reintubated 8/24 - s/p trach 8/30. - Circuit changed 8/24 due to concern for hemolysis and worsening oxygenation. Repeat circuit change 9/17 - CVVHD started 8/22 for volume removal and uremia. Remains anuric. CXR worse on 9/9 so switched back to CVVHD. Now keeping at -25-50/hr. D/w Renal. No change.  - Off IV sedation. Continue melatonin and seroquel for sleep at night. Adjusted to help with restlessness and sundowning at night - Stable off pressors. On midodrine 10 tid. Has wide pulse pressure but no AI on echo.  - Lung function returning very slowly. I suspect major  issue remains respiratory muscle weakness and poor lung compliance but still with diffuse infiltrates. Continue to wean sweep. Now down to 4.5  Suspect baseline pCO2 is in 50s. Ok to let it drift up a bit. Continue to keep her mildly acidotic (keep 7.30 or above) and encourage respiratory muscle training. Continue to wean sweep today. If unable to get sweep down further will need chest CT in next few days. Will start abx to cover potential PNA. Await speciation  - lactate back down which is good. But elevated lactate raises question of infection. Cover with abx - Continue heparin with level 0.2-0.3 Discussed dosing with PharmD personally   2. Enterococcus sepsis - Finished high-dose zosyn on 9/7 per ID - Eraxis added on 8/26 with yeast in BAL 8/24 (ended 9/2)  - F/u cx 9/7 No growth - starting abx as above  3. Intracranial hemorrhage - ? Septic emboli - repeat head CT on 8/14 with stable bleeds but increased edema - neurology has seen. Suspect significant long-term injury sustained. Will follow commands. Appears to have dense LUE weakness and possibly LLE - repeat head CT stable 8/16 - tolerating heparin. No change. Heparin level 0.16  4. Thrombocytopenia - PLTs stable around 90k - Continue to follow.   5. Morbid obesity - Body mass index is Body mass index is 43.14 kg/m.  6. Poorly controlled DM2 - HgBA1c 10.7 - IV insulin converted to SQ insulin on 9/15  7. PAF - intermittent episodes. Last 8/26 - Now on po. Quiescent   8. AKI/azotemia - CVVHD started on 8/22 - Down 70 pounds.  - transitioned to [facility] on 9/7 - switched back to CVVHD for better volume removal on 9/9. - new catheter placed 9/9 - will eventually need tunneled access - Keep UF rate -25 to - 50 as above. Weight stable.  - CVVHD circuit changed 2x overngiht - D/w Renal  9. Emesis - Has GJ tube now, tolerating TFs  10. Sacral decub  - large unstageable. Seen by GSU. Not candidate for debridement at this  point.  - for hydrotherapy day #3 today - abx as above  11. Necrotic fingers - VVS following - suspect will auto amputate  12. Anemia - Transfuse hgb < 8   CRITICAL CARE Performed by: Glori Bickers  Total critical care time: 35 minutes  Critical care time was exclusive of separately billable procedures and treating other patients.  Critical care was necessary to treat or prevent imminent or life-threatening deterioration.  Critical care was time spent personally by me (independent of midlevel providers or residents) on the following activities: development of treatment plan with patient and/or surrogate as well as nursing, discussions with consultants, evaluation of patient's response to treatment, examination of patient, obtaining history from patient or surrogate, ordering and performing treatments and interventions, ordering and review of laboratory studies, ordering and review of radiographic studies, pulse oximetry and re-evaluation of patient's condition.   Length of Stay: Clifford  MD 03/01/2020, 11:04 AM  Advanced Heart Failure Team Pager [phone] (M-F; [place])  Please contact [facility] for night-coverage after hours (4p -7a ) and weekends on [email or web address]

## 2020-03-01 NOTE — Progress Notes (Signed)
Pharmacy Antibiotic Note  Alisha Stephenson is a 54 y.o. female admitted on 02/01/2020 with COVID pneumonia now s/p VV ECMO cannulation on 01/14/2020.    Large sacral wound noted earlier this week, receiving hydrotherapy. Lactic acid bumped yesterday now back down, concern for sepsis will start broad antibiotics.   Afebrile, WBC down to 8, LA 1.7. Remains on CRRT.   Serratia now growing in wound and pseudomonas in trach aspirate.   Plan: Vancomcyin 2g IV now then 1250 q24 hours Cefepime 2g q8 hours   Height: _0  (162.6 cm) Weight: 114 kg (251 lb 5.2 oz) IBW/kg (Calculated) : 54.7  Temp (24hrs), Avg:98.1 F (36.7 C), Min:97.8 F (36.6 C), Max:98.9 F (37.2 C)  Recent Labs  Lab 02/28/20 0435 02/28/20 1618 02/28/20 1633 02/28/20 1753 02/29/20 0410 02/29/20 0411 02/29/20 1635 03/01/20 0427  WBC 8.8  --   --  12.0*  --  7.6 9.5 8.1  CREATININE 1.06* 1.03*  --   --   --  1.03* 1.03* 1.04*  LATICACIDVEN 1.5  --  1.5  --  2.3*  --  2.1* 1.5    Estimated Creatinine Clearance: 75.6 mL/min (A) (by C-G formula based on SCr of 1.04 mg/dL (H)).    Allergies  Allergen Reactions   Sulfa Antibiotics Rash and Other (See Comments)   Antimicrobials this admission: 8/3 CTX >> 8/6; 8/22 >>8/23 8/3 zithromax >> 8/6 8/2 Remdesivir >> 8/6 8/2 Actemra 8/12 Vanc >>8/15; 8/19 >> 8/22, 9/24> 8/12 Meropenem >> 8/13 8/13 Zosyn high dose  >> 8/22; 8/23 >>9/7 8/19 Fluconazole >> 8/22 8/22 ampicillin >> 8/23 8/26 Eraxis >>9/2 9/8 fluconazole x1 for vaginal yeast infx Cefepime 9/24>>  Microbiology Results:  9/23 TA: pseudomonas 9/23 Wound/back: serratia 9/7 Bcx: neg 8/25 Bcx: neg 8/24 BAL: 10K candida dubliniensis 8/19 Bcx: neg 8/15 Bcx: neg 8/12 Bcx: enterococcus amp S 8/12 TA: abundant MSSA 8/2 BCx: NGF 8/4 UCx: ngf 8/4 Sputum: normal flora 8/3 MRSA PCR: neg 8/2 COVID: positive  Erin Hearing PharmD., BCPS Clinical Pharmacist 03/01/2020 3:25 PM   Please check [email or web address] for  unit-specific pharmacy phone numbers.

## 2020-03-01 NOTE — Progress Notes (Signed)
Patient ID: Alisha Stephenson, female   DOB: 09-15-1965, 54 y.o.   MRN: [id number] S: No events overnight O:BP (!) 106/49 (BP Location: Left Wrist)   Pulse (!) 103   Temp 98.9 F (37.2 C) (Oral)   Resp (!) 24   Ht 5' 4" (1.626 m)   Wt 114 kg   SpO2 94%   BMI 43.14 kg/m   Intake/Output Summary (Last 24 hours) at 03/01/2020 0911 Last data filed at 03/01/2020 0800 Gross per 24 hour  Intake 2935.36 ml  Output 2598 ml  Net 337.36 ml   Intake/Output: I/O last 3 completed shifts: In: 5003.6 [I.V.:474.4; Blood:810; IRJJO:841; YS/AY:3016; IV Piggyback:554.2] Out: 0109 [Drains:100; Other:3856]  Intake/Output this shift:  Total I/O In: 77 [I.V.:12; NG/GT:65] Out: 107 [Other:107] Weight change: 1.2 kg Gen: slowed mentation this am, on vent via trach NAT:FTDDU at 103 Resp: mechanically ventilated BS Abd: obese, +BS, soft Ext: trace edema  Recent Labs  Lab 02/26/20 0417 02/26/20 0821 02/27/20 0403 02/27/20 0418 02/27/20 1643 02/27/20 2003 02/28/20 0435 02/28/20 0446 02/28/20 1618 02/28/20 1626 02/29/20 0411 02/29/20 0423 02/29/20 1635 02/29/20 1645 02/29/20 1822 02/29/20 2007 03/01/20 0423 03/01/20 0427 03/01/20 0803  NA 135   < > 134*   < > 134*   < > 136   < > 134*   < > 134*   < > 132* 133* 132* 134* 133* 132* 133*  K 4.5   < > 4.8   < > 5.1   < > 4.8   < > 5.0   < > 4.5   < > 5.0 4.9 5.0 4.8 4.5 4.4 4.8  CL 100   < > 101  --  98  --  101  --  99  --  99  --  98  --   --   --   --  96*  --   CO2 25   < > 25  --  24  --  25  --  26  --  25  --  24  --   --   --   --  24  --   GLUCOSE 290*   < > 208*  --  271*  --  221*  --  235*  --  174*  --  239*  --   --   --   --  179*  --   BUN 36*   < > 34*  --  37*  --  39*  --  37*  --  37*  --  38*  --   --   --   --  41*  --   CREATININE 0.95   < > 1.01*  --  1.02*  --  1.06*  --  1.03*  --  1.03*  --  1.03*  --   --   --   --  1.04*  --   ALBUMIN 3.4*  --  3.2*  --  3.2*  --   --   --  3.0*  --  3.1*  --  3.1*  --   --   --   --   3.1*  --   CALCIUM 8.9   < > 8.8*  --  8.7*  --  9.0  --  8.8*  --  8.8*  --  8.8*  --   --   --   --  8.8*  --   PHOS 2.6  --  1.9*  --  3.5  --  2.4*  --  4.0  --  2.0*  --  3.2  --   --   --   --  3.2  --    < > = values in this interval not displayed.   Liver Function Tests: Recent Labs  Lab 02/29/20 0411 02/29/20 1635 03/01/20 0427  ALBUMIN 3.1* 3.1* 3.1*   No results for input(s): LIPASE, AMYLASE in the last 168 hours. No results for input(s): AMMONIA in the last 168 hours. CBC: Recent Labs  Lab 02/28/20 0435 02/28/20 0446 02/28/20 1753 02/28/20 1952 02/29/20 0411 02/29/20 0423 02/29/20 1635 02/29/20 1645 03/01/20 0423 03/01/20 0427 03/01/20 0803  WBC 8.8   < > 12.0*   < > 7.6  --  9.5  --   --  8.1  --   NEUTROABS  --   --  9.5*  --   --   --   --   --   --   --   --   HGB 8.2*   < > 8.8*   < > 7.7*   < > 9.1*   < > 9.5* 9.2* 10.5*  HCT 26.9*   < > 28.9*   < > 25.0*   < > 29.5*   < > 28.0* 29.8* 31.0*  MCV 102.7*  --  102.5*  --  103.7*  --  102.1*  --   --  99.0  --   PLT 96*   < > 113*   < > 90*  --  93*  --   --  97*  --    < > = values in this interval not displayed.   Cardiac Enzymes: No results for input(s): CKTOTAL, CKMB, CKMBINDEX, TROPONINI in the last 168 hours. CBG: Recent Labs  Lab 02/29/20 1644 02/29/20 2004 02/29/20 2342 03/01/20 0420 03/01/20 0802  GLUCAP 227* 176* 185* 170* 268*    Iron Studies: No results for input(s): IRON, TIBC, TRANSFERRIN, FERRITIN in the last 72 hours. Studies/Results: DG CHEST PORT 1 VIEW  Result Date: 03/01/2020 CLINICAL DATA:  Respiratory failure with ECMO treatment EXAM: PORTABLE CHEST 1 VIEW COMPARISON:  February 29, 2020 FINDINGS: Tracheostomy catheter tip is 6.6 cm above the carina. ECMO catheter tip in inferior vena cava. Left jugular catheter tip is in the superior vena cava. No pneumothorax. Widespread airspace opacity persists throughout the lungs without change. There is cardiomegaly, stable. Pulmonary  vascularity appears grossly within normal limits, limited in visualization due to the overlying airspace consolidation. No bone lesions. IMPRESSION: Stable widespread airspace opacity bilaterally. Stable cardiac prominence. Tube and catheter positions as described. No pneumothorax evident. Electronically Signed   By: Lowella Grip III M.D.   On: 03/01/2020 08:11   DG CHEST PORT 1 VIEW  Result Date: 02/29/2020 CLINICAL DATA:  ARDS.  Tracheostomy.  ECMO. EXAM: PORTABLE CHEST 1 VIEW COMPARISON:  02/28/2020. FINDINGS: Tracheostomy tube, left IJ line, right PICC line, ECMO catheter stable position. Stable cardiomegaly. Diffuse severe bilateral pulmonary infiltrates again noted without interim change. No prominent pleural effusion. No pneumothorax. Prior cervical spine fusion. IMPRESSION: 1.  Lines and tubes including ECMO device in stable position. 2.  Stable cardiomegaly. 3. Diffuse severe bilateral pulmonary infiltrates again noted without interim change. Electronically Signed   By: Marcello Moores  Register   On: 02/29/2020 06:52   . amiodarone  200 mg Per Tube Daily  . vitamin C  500 mg Per Tube Daily  . chlorhexidine gluconate (MEDLINE KIT)  15 mL Mouth Rinse  BID  . Chlorhexidine Gluconate Cloth  6 each Topical Q0600  . clonazePAM  1 mg Per Tube Q6H  . collagenase   Topical Daily  . docusate  100 mg Per Tube BID  . feeding supplement (PROSource TF)  45 mL Per Tube QID  . Gerhardt's butt cream   Topical BID  . insulin aspart  0-15 Units Subcutaneous Q4H  . insulin aspart  15 Units Subcutaneous Q4H  . insulin detemir  50 Units Subcutaneous BID  . linagliptin  5 mg Per Tube Daily  . mouth rinse  15 mL Mouth Rinse 10 times per day  . melatonin  9 mg Per Tube QHS  . metoCLOPramide (REGLAN) injection  5 mg Intravenous Q8H  . midodrine  10 mg Per Tube TID  . multivitamin  1 tablet Per Tube QHS  . oxyCODONE  10 mg Per Tube Q6H  . pantoprazole (PROTONIX) IV  40 mg Intravenous BID  . pneumococcal 23  valent vaccine  0.5 mL Intramuscular Tomorrow-1000  . polyethylene glycol  17 g Per Tube Daily  . QUEtiapine  100 mg Per Tube QHS  . sennosides  5 mL Per Tube BID  . zinc sulfate  220 mg Per Tube Daily    BMET    Component Value Date/Time   NA 133 (L) 03/01/2020 0803   K 4.8 03/01/2020 0803   CL 96 (L) 03/01/2020 0427   CO2 24 03/01/2020 0427   GLUCOSE 179 (H) 03/01/2020 0427   BUN 41 (H) 03/01/2020 0427   CREATININE 1.04 (H) 03/01/2020 0427   CALCIUM 8.8 (L) 03/01/2020 0427   GFRNONAA >60 03/01/2020 0427   GFRAA >60 03/01/2020 0427   CBC    Component Value Date/Time   WBC 8.1 03/01/2020 0427   RBC 3.01 (L) 03/01/2020 0427   HGB 10.5 (L) 03/01/2020 0803   HCT 31.0 (L) 03/01/2020 0803   PLT 97 (L) 03/01/2020 0427   MCV 99.0 03/01/2020 0427   MCH 30.6 03/01/2020 0427   MCHC 30.9 03/01/2020 0427   RDW 20.7 (H) 03/01/2020 0427   LYMPHSABS 1.0 02/28/2020 1753   MONOABS 0.8 02/28/2020 1753   EOSABS 0.4 02/28/2020 1753   BASOSABS 0.1 02/28/2020 1753    Assessment/Plan:  1. Anuric AKI in setting of ischemic ATN related to septic shock from COVD-19 PNA requiring VV ECMO. Started on CRRT 8/22-02/12/20 and transitioned to IHD on 02/13/20 but back to CRRT on 02/15/20 due to volume overload and large IVF intake.  1. Tolerating CRRT and tolerating UF. 2. No changes to CRRT today. 2. Vascular access- L IJ trialysis catheter placed 02/15/20.No issues for now. 3. Hypophosphatemia- due to CRRT-re-ordered replacement phos and follow. 4. Shock due to COVID-19 infection diagnosed 01/30/2020 as well as enterococcal bacteremia- completed zosyn 02/13/20, negative TTE. Off of pressors. 5. Acute hypoxic/hypercapneic respiratory failure in setting of severe covid PNA/ARDS- now on VV ECMO per PCCM/CHF. Lung function slowly returning. UF as bp tolerates. 6. ICH- stable by CT scan 8/14. Likely will have long-term deficits. 7. Thrombocytopenia- stable 8. Morbid obesity 9. DM type 2 10. P. Atrial  fibrillation  Donetta Potts, MD [facility] [phone]

## 2020-03-01 NOTE — Procedures (Signed)
Extracorporeal support note  ECLS support day: Cannulated 01/18/2020, Day 46 Indication: COVID 19 ARDS   Configuration: ECMO Mode: VV  Drainage cannula: RIJ [place]  Return cannula: Same   Pump speed: 4000 rpm  Pump flow: Flow (LPM): 4.82  Pump used: ECMO Device: Centrimag  Oxygenator: Quadrox O2 blender: 100% Sweep gas: 4.5L  Circuit check: no clots in oxygenator  Case discussed with ECMO specialist at bedside Anticoagulant: Heparin Anticoagulation targets: Anticoagulation Goal: Hep 0.2-0.3  Changes in support: Continue sweep weans  Anticipated goals/duration of support: Bridge to recovery  All issues highlighted discussed on multidisciplinary team rounding.  Candee Furbish, MD [facility] 03/01/2020 9:55 AM

## 2020-03-01 NOTE — Progress Notes (Signed)
Assisted tele visit to patient with family member.  Alisha Sulser McEachran, RN  

## 2020-03-01 NOTE — Plan of Care (Signed)
Problem: Education: Goal: Knowledge of General Education information will improve Description: Including pain rating scale, medication(s)/side effects and non-pharmacologic comfort measures Outcome: Progressing   Problem: Health Behavior/Discharge Planning: Goal: Ability to manage health-related needs will improve Outcome: Progressing   Problem: Clinical Measurements: Goal: Ability to maintain clinical measurements within normal limits will improve Outcome: Progressing Goal: Will remain free from infection Outcome: Progressing Goal: Diagnostic test results will improve Outcome: Progressing Goal: Respiratory complications will improve Outcome: Progressing Goal: Cardiovascular complication will be avoided Outcome: Progressing   Problem: Activity: Goal: Risk for activity intolerance will decrease Outcome: Progressing   Problem: Nutrition: Goal: Adequate nutrition will be maintained Outcome: Progressing   Problem: Coping: Goal: Level of anxiety will decrease Outcome: Progressing   Problem: Elimination: Goal: Will not experience complications related to bowel motility Outcome: Progressing Goal: Will not experience complications related to urinary retention Outcome: Progressing   Problem: Pain Managment: Goal: General experience of comfort will improve Outcome: Progressing   Problem: Safety: Goal: Ability to remain free from injury will improve Outcome: Progressing   Problem: Skin Integrity: Goal: Risk for impaired skin integrity will decrease Outcome: Progressing   Problem: Education: Goal: Knowledge of risk factors and measures for prevention of condition will improve Outcome: Progressing   Problem: Coping: Goal: Psychosocial and spiritual needs will be supported Outcome: Progressing   Problem: Respiratory: Goal: Will maintain a patent airway Outcome: Progressing Goal: Complications related to the disease process, condition or treatment will be avoided or  minimized Outcome: Progressing   Problem: Education: Goal: Knowledge of risk factors and measures for prevention of condition will improve Outcome: Progressing   Problem: Coping: Goal: Psychosocial and spiritual needs will be supported Outcome: Progressing   Problem: Respiratory: Goal: Will maintain a patent airway Outcome: Progressing Goal: Complications related to the disease process, condition or treatment will be avoided or minimized Outcome: Progressing   Problem: Education: Goal: Ability to describe self-care measures that may prevent or decrease complications (Diabetes Survival Skills Education) will improve Outcome: Progressing Goal: Individualized Educational Video(s) Outcome: Progressing   Problem: Coping: Goal: Ability to adjust to condition or change in health will improve Outcome: Progressing   Problem: Fluid Volume: Goal: Ability to maintain a balanced intake and output will improve Outcome: Progressing   Problem: Health Behavior/Discharge Planning: Goal: Ability to identify and utilize available resources and services will improve Outcome: Progressing Goal: Ability to manage health-related needs will improve Outcome: Progressing   Problem: Metabolic: Goal: Ability to maintain appropriate glucose levels will improve Outcome: Progressing   Problem: Nutritional: Goal: Maintenance of adequate nutrition will improve Outcome: Progressing Goal: Progress toward achieving an optimal weight will improve Outcome: Progressing   Problem: Skin Integrity: Goal: Risk for impaired skin integrity will decrease Outcome: Progressing   Problem: Tissue Perfusion: Goal: Adequacy of tissue perfusion will improve Outcome: Progressing   Problem: Education: Goal: Knowledge of risk factors and measures for prevention of condition will improve Outcome: Progressing   Problem: Coping: Goal: Psychosocial and spiritual needs will be supported Outcome: Progressing    Problem: Respiratory: Goal: Will maintain a patent airway Outcome: Progressing Goal: Complications related to the disease process, condition or treatment will be avoided or minimized Outcome: Progressing   Problem: Education: Goal: Knowledge of disease or condition will improve Outcome: Progressing Goal: Knowledge of secondary prevention will improve Outcome: Progressing Goal: Knowledge of patient specific risk factors addressed and post discharge goals established will improve Outcome: Progressing Goal: Individualized Educational Video(s) Outcome: Progressing   Problem: Coping: Goal: Will verbalize  positive feelings about self Outcome: Progressing Goal: Will identify appropriate support needs Outcome: Progressing   Problem: Health Behavior/Discharge Planning: Goal: Ability to manage health-related needs will improve Outcome: Progressing   Problem: Self-Care: Goal: Ability to participate in self-care as condition permits will improve Outcome: Progressing Goal: Verbalization of feelings and concerns over difficulty with self-care will improve Outcome: Progressing Goal: Ability to communicate needs accurately will improve Outcome: Progressing   Problem: Nutrition: Goal: Risk of aspiration will decrease Outcome: Progressing Goal: Dietary intake will improve Outcome: Progressing   Problem: Intracerebral Hemorrhage Tissue Perfusion: Goal: Complications of Intracerebral Hemorrhage will be minimized Outcome: Progressing   Problem: Ischemic Stroke/TIA Tissue Perfusion: Goal: Complications of ischemic stroke/TIA will be minimized Outcome: Progressing   Problem: Spontaneous Subarachnoid Hemorrhage Tissue Perfusion: Goal: Complications of Spontaneous Subarachnoid Hemorrhage will be minimized Outcome: Progressing

## 2020-03-01 NOTE — Progress Notes (Signed)
[facility] for Heparin Indication: ECMO  Allergies  Allergen Reactions  . Sulfa Antibiotics Rash and Other (See Comments)    Patient Measurements: Height: 5\' 4"  (162.6 cm) Weight: 114 kg (251 lb 5.2 oz) IBW/kg (Calculated) : 54.7 Heparin Dosing Weight: 87 kg  Vital Signs: Temp: 99.5 F (37.5 C) (09/24 1500) Temp Source: Oral (09/24 1500) BP: 112/49 (09/24 1600) Pulse Rate: 100 (09/24 1600)  Labs: Recent Labs    02/29/20 1635 02/29/20 1645 03/01/20 0427 03/01/20 0803 03/01/20 1123 03/01/20 1123 03/01/20 1531 03/01/20 1539  HGB 9.1*   < > 9.2*   < > 10.5*   < > 10.9* 9.9*  HCT 29.5*   < > 29.8*   < > 31.0*  --  32.0* 32.1*  PLT 93*  --  97*  --   --   --   --  104*  HEPARINUNFRC 0.17*  --  0.14*  --   --   --   --  0.22*  CREATININE 1.03*  --  1.04*  --   --   --   --  1.08*   < > = values in this interval not displayed.    Estimated Creatinine Clearance: 72.8 mL/min (A) (by C-G formula based on SCr of 1.08 mg/dL (H)).  Assessment: 54 yo female COVID+ on VV ECMO. Patient started on bivalirudin but was found to have multiple ICH on 8/13 head CT. Bivalirudin was stopped and pt was off anticoagulation at that time. Her ECMO circuit was changed 8/24. Pharmacy asked to start low-fixed rate heparin.   Heparin was started at low fixed rate for pump patency in setting of ICH. Concern with increased clotting off CRRT as well as circuit clots - s/p circuit exchange 9/17 so will titrate heparin to a low goal of 0.2-0.3  Hep lvl within goal  Goal of Therapy:  HL 0.2-0.3 Monitor platelets by anticoagulation protocol: Yes   Plan:  Continue IV heparin 1200 units/hr  Monitor q12hr HL, CBC/plt Monitor for signs/symptoms of bleeding   Barth Kirks, PharmD, BCPS, BCCCP Clinical Pharmacist [phone]  Please check AMION for all [place] numbers  03/01/2020 4:53 PM

## 2020-03-01 NOTE — Progress Notes (Signed)
Physical Therapy Wound Treatment Patient Details  Name: Alisha Stephenson MRN: [id number] Date of Birth: November 06, 1965  Today's Date: 03/01/2020 Time: 1020-1103 Time Calculation (min): 43 min  Subjective  Subjective: Non-verbal throughout Patient and Family Stated Goals: Heal wound - per husband Date of Onset:  (Unknown)  Pain Score:  Pt premedicated. Appears painful throughout but indicates she is ok to continue without additional pain medication.   Wound Assessment  Pressure Injury 02/21/20 Sacrum Posterior Unstageable - Full thickness tissue loss in which the base of the injury is covered by slough (yellow, tan, gray, green or brown) and/or eschar (tan, brown or black) in the wound bed. Unstageable Sacral Wound (Active)  Dressing Type ABD;Barrier Film (skin prep);Gauze (Comment);Moist to dry 03/01/20 1400  Dressing Changed;Clean;Dry;Intact 03/01/20 1400  Dressing Change Frequency Daily 03/01/20 1400  State of Healing Eschar 03/01/20 1400  Site / Wound Assessment Pink;Yellow;Black 03/01/20 1400  % Wound base Red or Granulating 20% 03/01/20 1400  % Wound base Yellow/Fibrinous Exudate 40% 03/01/20 1400  % Wound base Black/Eschar 40% 03/01/20 1400  % Wound base Other/Granulation Tissue (Comment) 0% 03/01/20 1400  Peri-wound Assessment Intact;Bleeding;Erythema (blanchable) 03/01/20 1400  Wound Length (cm) 7.5 cm 02/28/20 1358  Wound Width (cm) 7.5 cm 02/28/20 1358  Wound Depth (cm) 0.2 cm 02/28/20 1358  Wound Surface Area (cm^2) 56.25 cm^2 02/28/20 1358  Wound Volume (cm^3) 11.25 cm^3 02/28/20 1358  Margins Unattached edges (unapproximated) 03/01/20 1400  Drainage Amount Moderate 03/01/20 1400  Drainage Description Sanguineous 03/01/20 1400  Treatment Debridement (Selective);Hydrotherapy (Pulse lavage);Packing (Saline gauze) 03/01/20 1400   Santyl applied to wound bed prior to applying dressing.     Hydrotherapy Pulsed lavage therapy - wound location: Sacrum Pulsed Lavage with Suction  (psi): 12 psi Pulsed Lavage with Suction - Normal Saline Used: 1000 mL Pulsed Lavage Tip: Tip with splash shield Selective Debridement Selective Debridement - Location: Sacrum Selective Debridement - Tools Used: Forceps;Scalpel Selective Debridement - Tissue Removed: Eschar and non-viable tissue   Wound Assessment and Plan  Wound Therapy - Assess/Plan/Recommendations Wound Therapy - Clinical Statement: Pt progressing with debridement. Noted drainage on the ABD was very dark - almost black - when removing today. The wound itself appeared almost fully black but lightened up with pulsed-lavage. Will continue to follow with hydrotherapy for selective removal of non-viable tissue, to decrease bioburden, and promote wound bed healing.  Wound Therapy - Functional Problem List: Decreased general mobility, on ECMO, CRRT, vent Factors Delaying/Impairing Wound Healing: Immobility;Multiple medical problems Hydrotherapy Plan: Debridement;Dressing change;Patient/family education;Pulsatile lavage with suction Wound Therapy - Frequency: 6X / week Wound Therapy - Follow Up Recommendations: Skilled nursing facility (vs LTACH) Wound Plan: See above  Wound Therapy Goals- Improve the function of patient's integumentary system by progressing the wound(s) through the phases of wound healing (inflammation - proliferation - remodeling) by: Decrease Necrotic Tissue to: 20% Decrease Necrotic Tissue - Progress: Progressing toward goal Increase Granulation Tissue to: 80% Increase Granulation Tissue - Progress: Progressing toward goal Goals/treatment plan/discharge plan were made with and agreed upon by patient/family: Yes Time For Goal Achievement: 7 days Wound Therapy - Potential for Goals: Fair  Goals will be updated until maximal potential achieved or discharge criteria met.  Discharge criteria: when goals achieved, discharge from hospital, MD decision/surgical intervention, no progress towards goals,  refusal/missing three consecutive treatments without notification or medical reason.  GP     Thelma Comp 03/01/2020, 2:41 PM   Rolinda Roan, PT, DPT Acute Rehabilitation Services Pager: [phone] Office: [phone]

## 2020-03-01 NOTE — Progress Notes (Signed)
Physical Therapy Treatment Patient Details Name: Alisha Stephenson MRN: [id number] DOB: 26-Dec-1965 Today's Date: 03/01/2020    History of Present Illness Pt is 54 yo admitted 8/2 with hypoxemia due to Covid 19 PNA. 8/3 intubated. 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 CRRT 8/22-9/6, restarted 9/9. Reintubated 8/24 with ECMO circuit changed, 8/30 trach. 9/10 PEG tube. 9/17 circuit change. PMHx: DM, diverticulitis, Asthma    PT Comments    Pt with decreased alertness and engagement throughout session and presume maybe due to pain medications required for hydrotherapy. Discussed with hydro PT and RN limiting medication as able to progress mobility. Pt with very inconsistent assist for bil LE HEP today with initial fixation on only performing RLE SAQ with the rest passive but would then engage and assist in a few repetitions of each exercise except for dorsiflexion bil. Pt with assist for flexion of right shoulder and elbow to scratch her nose. Pt did engage core to lift head and neck off surface with multimodal cues x 3 with HOB 30 degrees. Will continue to follow and spouse aware of assistive HEP.  SpO2 97-98% throughout on 40% pressure control    Follow Up Recommendations  LTACH;Supervision/Assistance - 24 hour     Equipment Recommendations  Other (comment) (TBD)    Recommendations for Other Services       Precautions / Restrictions Precautions Precautions: Other (comment) Precaution Comments: ECMO with RIJ access anchors at right shoulder and chest, right necrotic thumb and index finger, bruised/necrotic left hand, trach, vent, CRRT, sacral wound, Gtube to suction, Jtube for nutrition    Mobility  Bed Mobility Overal bed mobility: Needs Assistance   Rolling: Max assist;+2 for physical assistance         General bed mobility comments: +2 total for slide to [facility], transition supine<>stand via tilt feature of bed, rolling for  positioning and off weighting  Transfers                 General transfer comment: tilt bed to transition from supine to sit and supine to stand to 40 degrees  Ambulation/Gait                 Stairs             Wheelchair Mobility    Modified Rankin (Stroke Patients Only) Modified Rankin (Stroke Patients Only) Pre-Morbid Rankin Score: No symptoms Modified Rankin: Severe disability     Balance Overall balance assessment: Needs assistance           Standing balance-Leahy Scale: Poor Standing balance comment: left lean in standing with decreased core engagement to lift off surface in standing. Physical assist to contract left quad in standing                            Cognition Arousal/Alertness: Awake/alert Behavior During Therapy: Flat affect Overall Cognitive Status: Impaired/Different from baseline Area of Impairment: Safety/judgement;Following commands                       Following Commands: Follows one step commands with increased time;Follows one step commands inconsistently       General Comments: pt with increased delay in command following, not as talkative or engaged this session as prior date- presume due to medication not clearing her system      Exercises General Exercises - Lower Extremity Ankle Circles/Pumps: PROM;Both;10 reps;Seated Short Arc Quad: PROM;AAROM;AROM;Both;10  reps;Seated Hip ABduction/ADduction: PROM;AROM;AAROM;Both;15 reps Hip Flexion/Marching: AAROM;Both;Seated;10 reps    General Comments        Pertinent Vitals/Pain Pain Score: 5  Pain Location: sacrum Pain Descriptors / Indicators: Grimacing;Sore;Guarding Pain Intervention(s): Limited activity within patient's tolerance;Monitored during session;Repositioned    Home Living                      Prior Function            PT Goals (current goals can now be found in the care plan section) Progress towards PT goals:  Progressing toward goals (limited by lethargy)    Frequency    Min 3X/week      PT Plan Current plan remains appropriate    Co-evaluation              AM-PAC PT "6 Clicks" Mobility   Outcome Measure  Help needed turning from your back to your side while in a flat bed without using bedrails?: Total Help needed moving from lying on your back to sitting on the side of a flat bed without using bedrails?: Total Help needed moving to and from a bed to a chair (including a wheelchair)?: Total Help needed standing up from a chair using your arms (e.g., wheelchair or bedside chair)?: Total Help needed to walk in hospital room?: Total Help needed climbing 3-5 steps with a railing? : Total 6 Click Score: 6    End of Session   Activity Tolerance: Patient tolerated treatment well Patient left: in bed;with family/visitor present;with nursing/sitter in room Nurse Communication: Mobility status PT Visit Diagnosis: Other abnormalities of gait and mobility (R26.89);Muscle weakness (generalized) (M62.81)     Time: 8412-8208 PT Time Calculation (min) (ACUTE ONLY): 49 min  Charges:  $Therapeutic Exercise: 8-22 mins $Therapeutic Activity: 23-37 mins                     Darla Mcdonald P, PT Acute Rehabilitation Services Pager: [phone] Office: [phone]    Dustee Bottenfield B Reyna Lorenzi 03/01/2020, 1:25 PM

## 2020-03-01 NOTE — Progress Notes (Signed)
SLP Cancellation Note  Patient Details Name: Alisha Stephenson MRN: [id number] DOB: 1965-10-20   Cancelled treatment:       Reason Eval/Treat Not Completed: Fatigue/lethargy limiting ability to participate. Planned for inline PMSV with RT at 1030, but pt just sedated. Will f/u next week for further attempts.    Darthula Desa, Katherene Ponto 03/01/2020, 10:16 AM

## 2020-03-01 NOTE — Progress Notes (Signed)
[place] for Heparin Indication: ECMO  Allergies  Allergen Reactions  . Sulfa Antibiotics Rash and Other (See Comments)    Patient Measurements: Height: 5\' 4"  (162.6 cm) Weight: 114 kg (251 lb 5.2 oz) IBW/kg (Calculated) : 54.7 Heparin Dosing Weight: 87 kg  Vital Signs: Temp: 97.8 F (36.6 C) (09/24 0100) Temp Source: Oral (09/24 0100) BP: 103/57 (09/24 0600) Pulse Rate: 100 (09/24 0600)  Labs: Recent Labs    02/29/20 0411 02/29/20 0423 02/29/20 1635 02/29/20 1645 02/29/20 2007 02/29/20 2007 03/01/20 0423 03/01/20 0427  HGB 7.7*   < > 9.1*   < > 8.8*   < > 9.5* 9.2*  HCT 25.0*   < > 29.5*   < > 26.0*  --  28.0* 29.8*  PLT 90*  --  93*  --   --   --   --  97*  HEPARINUNFRC 0.20*  --  0.17*  --   --   --   --  0.14*  CREATININE 1.03*  --  1.03*  --   --   --   --  1.04*   < > = values in this interval not displayed.    Estimated Creatinine Clearance: 75.6 mL/min (A) (by C-G formula based on SCr of 1.04 mg/dL (H)).  Assessment: 54 yo female COVID+ on VV ECMO. Patient started on bivalirudin but was found to have multiple ICH on 8/13 head CT. Bivalirudin was stopped and pt was off anticoagulation at that time. Her ECMO circuit was changed 8/24. Pharmacy asked to start low-fixed rate heparin.   Heparin was started at low fixed rate for pump patency in setting of ICH. Concern with increased clotting off CRRT as well as circuit clots - s/p circuit exchange 9/17 so will titrate heparin to a low goal of 0.2-0.3  Heparin level trending down on 1150 units/hr with a fixed rate of 500 units/hr running into CRRT circuit. Hgb stable ~8.5-9.5, receiving many RBC transfusions.  Per RN, CRRT machine clotted 9/23 PM and needed to be changed, Blood unable to be returned to patient from previous machine. Pt also got 1 unit RBC. No bleeding other than small amounts when suctioned. Confirmed with RN no issues with heparin IV infusion or heparin in  CRRT.   Goal of Therapy:  HL 0.2-0.3 Monitor platelets by anticoagulation protocol: Yes   Plan:  Increase IV heparin to 1200 units/hr  Monitor q12hr HL, CBC/plt Monitor for signs/symptoms of bleeding   Benetta Spar, PharmD, BCPS, BCCP Clinical Pharmacist  Please check AMION for all [place] phone numbers After 10:00 PM, call [place] [phone]

## 2020-03-02 ENCOUNTER — Inpatient Hospital Stay (HOSPITAL_COMMUNITY): Payer: PRIVATE HEALTH INSURANCE

## 2020-03-02 LAB — CBC
HCT: 29 % — ABNORMAL LOW (ref 36.0–46.0)
HCT: 30 % — ABNORMAL LOW (ref 36.0–46.0)
Hemoglobin: 9.1 g/dL — ABNORMAL LOW (ref 12.0–15.0)
Hemoglobin: 9.4 g/dL — ABNORMAL LOW (ref 12.0–15.0)
MCH: 31 pg (ref 26.0–34.0)
MCH: 31.1 pg (ref 26.0–34.0)
MCHC: 31.4 g/dL (ref 30.0–36.0)
MCHC: 31.3 g/dL (ref 30.0–36.0)
MCV: 98.6 fL (ref 80.0–100.0)
MCV: 99.3 fL (ref 80.0–100.0)
Platelets: 99 10*3/uL — ABNORMAL LOW (ref 150–400)
Platelets: 101 10*3/uL — ABNORMAL LOW (ref 150–400)
RBC: 2.94 MIL/uL — ABNORMAL LOW (ref 3.87–5.11)
RBC: 3.02 MIL/uL — ABNORMAL LOW (ref 3.87–5.11)
RDW: 20.4 % — ABNORMAL HIGH (ref 11.5–15.5)
RDW: 20.2 % — ABNORMAL HIGH (ref 11.5–15.5)
WBC: 7.3 10*3/uL (ref 4.0–10.5)
WBC: 7.6 10*3/uL (ref 4.0–10.5)
nRBC: 0.4 % — ABNORMAL HIGH (ref 0.0–0.2)
nRBC: 0.4 % — ABNORMAL HIGH (ref 0.0–0.2)

## 2020-03-02 LAB — TYPE AND SCREEN
ABO/RH(D): O POS
Antibody Screen: NEGATIVE
Unit division: 0
Unit division: 0
Unit division: 0
Unit division: 0
Unit division: 0
Unit division: 0

## 2020-03-02 LAB — POCT I-STAT 7, (LYTES, BLD GAS, ICA,H+H)
Acid-base deficit: 2 mmol/L (ref 0.0–2.0)
Acid-base deficit: 1 mmol/L (ref 0.0–2.0)
Acid-base deficit: 2 mmol/L (ref 0.0–2.0)
Acid-base deficit: 2 mmol/L (ref 0.0–2.0)
Acid-base deficit: 1 mmol/L (ref 0.0–2.0)
Acid-base deficit: 2 mmol/L (ref 0.0–2.0)
Bicarbonate: 24.6 mmol/L (ref 20.0–28.0)
Bicarbonate: 25.9 mmol/L (ref 20.0–28.0)
Bicarbonate: 26.4 mmol/L (ref 20.0–28.0)
Bicarbonate: 25.2 mmol/L (ref 20.0–28.0)
Bicarbonate: 25 mmol/L (ref 20.0–28.0)
Bicarbonate: 25.8 mmol/L (ref 20.0–28.0)
Calcium, Ion: 1.24 mmol/L (ref 1.15–1.40)
Calcium, Ion: 1.26 mmol/L (ref 1.15–1.40)
Calcium, Ion: 1.32 mmol/L (ref 1.15–1.40)
Calcium, Ion: 1.29 mmol/L (ref 1.15–1.40)
Calcium, Ion: 1.25 mmol/L (ref 1.15–1.40)
Calcium, Ion: 1.26 mmol/L (ref 1.15–1.40)
HCT: 28 % — ABNORMAL LOW (ref 36.0–46.0)
HCT: 29 % — ABNORMAL LOW (ref 36.0–46.0)
HCT: 31 % — ABNORMAL LOW (ref 36.0–46.0)
HCT: 29 % — ABNORMAL LOW (ref 36.0–46.0)
HCT: 29 % — ABNORMAL LOW (ref 36.0–46.0)
HCT: 29 % — ABNORMAL LOW (ref 36.0–46.0)
Hemoglobin: 9.5 g/dL — ABNORMAL LOW (ref 12.0–15.0)
Hemoglobin: 10.5 g/dL — ABNORMAL LOW (ref 12.0–15.0)
Hemoglobin: 9.9 g/dL — ABNORMAL LOW (ref 12.0–15.0)
Hemoglobin: 9.9 g/dL — ABNORMAL LOW (ref 12.0–15.0)
Hemoglobin: 9.9 g/dL — ABNORMAL LOW (ref 12.0–15.0)
Hemoglobin: 9.9 g/dL — ABNORMAL LOW (ref 12.0–15.0)
O2 Saturation: 92 %
O2 Saturation: 93 %
O2 Saturation: 95 %
O2 Saturation: 92 %
O2 Saturation: 92 %
O2 Saturation: 95 %
Patient temperature: 37.1
Patient temperature: 97.5
Patient temperature: 97.5
Patient temperature: 97.8
Patient temperature: 97.4
Potassium: 3.4 mmol/L — ABNORMAL LOW (ref 3.5–5.1)
Potassium: 3.4 mmol/L — ABNORMAL LOW (ref 3.5–5.1)
Potassium: 3.5 mmol/L (ref 3.5–5.1)
Potassium: 3.5 mmol/L (ref 3.5–5.1)
Potassium: 3.4 mmol/L — ABNORMAL LOW (ref 3.5–5.1)
Potassium: 3.4 mmol/L — ABNORMAL LOW (ref 3.5–5.1)
Sodium: 136 mmol/L (ref 135–145)
Sodium: 136 mmol/L (ref 135–145)
Sodium: 136 mmol/L (ref 135–145)
Sodium: 134 mmol/L — ABNORMAL LOW (ref 135–145)
Sodium: 136 mmol/L (ref 135–145)
Sodium: 136 mmol/L (ref 135–145)
TCO2: 26 mmol/L (ref 22–32)
TCO2: 28 mmol/L (ref 22–32)
TCO2: 28 mmol/L (ref 22–32)
TCO2: 27 mmol/L (ref 22–32)
TCO2: 27 mmol/L (ref 22–32)
TCO2: 27 mmol/L (ref 22–32)
pCO2 arterial: 48.4 mmHg — ABNORMAL HIGH (ref 32.0–48.0)
pCO2 arterial: 55.4 mmHg — ABNORMAL HIGH (ref 32.0–48.0)
pCO2 arterial: 57 mmHg — ABNORMAL HIGH (ref 32.0–48.0)
pCO2 arterial: 51.6 mmHg — ABNORMAL HIGH (ref 32.0–48.0)
pCO2 arterial: 48.7 mmHg — ABNORMAL HIGH (ref 32.0–48.0)
pCO2 arterial: 50.9 mmHg — ABNORMAL HIGH (ref 32.0–48.0)
pH, Arterial: 7.314 — ABNORMAL LOW (ref 7.350–7.450)
pH, Arterial: 7.262 — ABNORMAL LOW (ref 7.350–7.450)
pH, Arterial: 7.282 — ABNORMAL LOW (ref 7.350–7.450)
pH, Arterial: 7.295 — ABNORMAL LOW (ref 7.350–7.450)
pH, Arterial: 7.3 — ABNORMAL LOW (ref 7.350–7.450)
pH, Arterial: 7.329 — ABNORMAL LOW (ref 7.350–7.450)
pO2, Arterial: 70 mmHg — ABNORMAL LOW (ref 83.0–108.0)
pO2, Arterial: 75 mmHg — ABNORMAL LOW (ref 83.0–108.0)
pO2, Arterial: 84 mmHg (ref 83.0–108.0)
pO2, Arterial: 71 mmHg — ABNORMAL LOW (ref 83.0–108.0)
pO2, Arterial: 70 mmHg — ABNORMAL LOW (ref 83.0–108.0)
pO2, Arterial: 77 mmHg — ABNORMAL LOW (ref 83.0–108.0)

## 2020-03-02 LAB — BPAM RBC
Blood Product Expiration Date: 202110252359
Blood Product Expiration Date: 202110252359
Blood Product Expiration Date: 202110252359
Blood Product Expiration Date: 202110262359
Blood Product Expiration Date: 202110262359
Blood Product Expiration Date: 202110262359
ISSUE DATE / TIME: 202109230629
ISSUE DATE / TIME: 202109232224
Unit Type and Rh: 5100
Unit Type and Rh: 5100
Unit Type and Rh: 5100
Unit Type and Rh: 5100
Unit Type and Rh: 5100
Unit Type and Rh: 5100

## 2020-03-02 LAB — RENAL FUNCTION PANEL
Albumin: 3.3 g/dL — ABNORMAL LOW (ref 3.5–5.0)
Albumin: 3.4 g/dL — ABNORMAL LOW (ref 3.5–5.0)
Anion gap: 13 (ref 5–15)
Anion gap: 13 (ref 5–15)
BUN: 44 mg/dL — ABNORMAL HIGH (ref 6–20)
BUN: 43 mg/dL — ABNORMAL HIGH (ref 6–20)
CO2: 24 mmol/L (ref 22–32)
CO2: 23 mmol/L (ref 22–32)
Calcium: 9.1 mg/dL (ref 8.9–10.3)
Calcium: 9.2 mg/dL (ref 8.9–10.3)
Chloride: 99 mmol/L (ref 98–111)
Chloride: 97 mmol/L — ABNORMAL LOW (ref 98–111)
Creatinine, Ser: 1.02 mg/dL — ABNORMAL HIGH (ref 0.44–1.00)
Creatinine, Ser: 0.91 mg/dL (ref 0.44–1.00)
GFR calc Af Amer: >60 mL/min (ref >60)
GFR calc Af Amer: >60 mL/min (ref >60)
GFR calc non Af Amer: >60 mL/min (ref >60)
GFR calc non Af Amer: >60 mL/min (ref >60)
Glucose, Bld: 207 mg/dL — ABNORMAL HIGH (ref 70–99)
Glucose, Bld: 220 mg/dL — ABNORMAL HIGH (ref 70–99)
Phosphorus: 2.5 mg/dL (ref 2.5–4.6)
Phosphorus: 3.5 mg/dL (ref 2.5–4.6)
Potassium: 3.5 mmol/L (ref 3.5–5.1)
Potassium: 3.3 mmol/L — ABNORMAL LOW (ref 3.5–5.1)
Sodium: 136 mmol/L (ref 135–145)
Sodium: 133 mmol/L — ABNORMAL LOW (ref 135–145)

## 2020-03-02 LAB — MAGNESIUM: Magnesium: 2.7 mg/dL — ABNORMAL HIGH (ref 1.7–2.4)

## 2020-03-02 LAB — LACTATE DEHYDROGENASE: LDH: 262 U/L — ABNORMAL HIGH (ref 98–192)

## 2020-03-02 LAB — ZINC: Zinc: 162 ug/dL — ABNORMAL HIGH (ref 44–115)

## 2020-03-02 LAB — GLUCOSE, CAPILLARY
Glucose-Capillary: 184 mg/dL — ABNORMAL HIGH (ref 70–99)
Glucose-Capillary: 205 mg/dL — ABNORMAL HIGH (ref 70–99)
Glucose-Capillary: 193 mg/dL — ABNORMAL HIGH (ref 70–99)
Glucose-Capillary: 179 mg/dL — ABNORMAL HIGH (ref 70–99)
Glucose-Capillary: 157 mg/dL — ABNORMAL HIGH (ref 70–99)

## 2020-03-02 LAB — COPPER, SERUM: Copper: 9 ug/dL — ABNORMAL LOW (ref 80–158)

## 2020-03-02 LAB — FIBRINOGEN: Fibrinogen: 718 mg/dL — ABNORMAL HIGH (ref 210–475)

## 2020-03-02 LAB — LACTIC ACID, PLASMA
Lactic Acid, Venous: 1.7 mmol/L (ref 0.5–1.9)
Lactic Acid, Venous: 2.1 mmol/L (ref 0.5–1.9)

## 2020-03-02 LAB — HEPARIN LEVEL (UNFRACTIONATED)
Heparin Unfractionated: 0.14 IU/mL — ABNORMAL LOW (ref 0.30–0.70)
Heparin Unfractionated: 0.17 IU/mL — ABNORMAL LOW (ref 0.30–0.70)

## 2020-03-02 MED ORDER — LIDOCAINE HCL URETHRAL/MUCOSAL 2 % EX GEL
1.0000 "application " | Freq: Every day | CUTANEOUS | Status: DC | PRN
  Filled 2020-03-02: qty 11

## 2020-03-02 MED ORDER — SODIUM PHOSPHATES 45 MMOLE/15ML IV SOLN
20.0000 mmol | Freq: Once | INTRAVENOUS | Status: AC
  Administered 2020-03-02: 20 mmol via INTRAVENOUS
  Filled 2020-03-02: qty 6.67

## 2020-03-02 NOTE — Progress Notes (Signed)
Patient family requesting Hydrotherapy be completed later in the afternoon due to the administration of pain medication and pt becoming tired from all the activity. It is hard for her to stay awake afterwards. With later therapy time, RN's able to medicate pain as well as give evening medications to promote a better sleep cycle. This RN stated it may be feasible during the week but with the weekend schedule it is more than likely going to be mid day. Will pass along to next shifts and will address on Monday with PT team when they are back in the facility.

## 2020-03-02 NOTE — Progress Notes (Signed)
Patient ID: GENNELL HOW, female   DOB: 08-Mar-1966, 54 y.o.   MRN: [id number] S: ECMO team meeting requests keeping even today due to issues with ECMO circuit. O:BP (!) 129/49   Pulse 90   Temp 97.6 F (36.4 C) (Oral)   Resp (!) 24   Ht 5' 4"  (1.626 m)   Wt 116.1 kg   SpO2 94%   BMI 43.93 kg/m   Intake/Output Summary (Last 24 hours) at 03/02/2020 0923 Last data filed at 03/02/2020 0800 Gross per 24 hour  Intake 3632.99 ml  Output 3680 ml  Net -47.01 ml   Intake/Output: I/O last 3 completed shifts: In: 5185.4 [I.V.:522.3; Blood:440; Other:1180; NG/GT:2340; IV Piggyback:703.1] Out: 0962 [Drains:400; EZMOQ:9476]  Intake/Output this shift:  Total I/O In: 107.5 [I.V.:22.5; NG/GT:85] Out: -  Weight change: 2.1 kg Gen: on vent via trach in NAD CVS: RRR Resp: decreased air movement Abd: obese, +BS, soft, NT, PEG in place Ext: no edema  Recent Labs  Lab 02/27/20 1643 02/27/20 2003 02/28/20 0435 02/28/20 0446 02/28/20 1618 02/28/20 1626 02/29/20 0411 02/29/20 0423 02/29/20 1635 02/29/20 1645 03/01/20 0427 03/01/20 0803 03/01/20 1123 03/01/20 1531 03/01/20 1539 03/01/20 1946 03/02/20 0222 03/02/20 0223 03/02/20 0835  NA 134*   < > 136   < > 134*   < > 134*   < > 132*   < > 132*   < > 135 134* 133* 135 136 136 136  K 5.1   < > 4.8   < > 5.0   < > 4.5   < > 5.0   < > 4.4   < > 4.3 4.4 4.4 4.3 3.5 3.4* 3.5  CL 98   < > 101  --  99  --  99  --  98  --  96*  --   --   --  99  --  99  --   --   CO2 24   < > 25  --  26  --  25  --  24  --  24  --   --   --  23  --  24  --   --   GLUCOSE 271*   < > 221*  --  235*  --  174*  --  239*  --  179*  --   --   --  220*  --  207*  --   --   BUN 37*   < > 39*  --  37*  --  37*  --  38*  --  41*  --   --   --  42*  --  44*  --   --   CREATININE 1.02*   < > 1.06*  --  1.03*  --  1.03*  --  1.03*  --  1.04*  --   --   --  1.08*  --  1.02*  --   --   ALBUMIN 3.2*  --   --   --  3.0*  --  3.1*  --  3.1*  --  3.1*  --   --   --  3.1*  --   3.3*  --   --   CALCIUM 8.7*   < > 9.0  --  8.8*  --  8.8*  --  8.8*  --  8.8*  --   --   --  8.8*  --  9.1  --   --   PHOS 3.5   < >  2.4*  --  4.0  --  2.0*  --  3.2  --  3.2  --   --   --  2.9  --  2.5  --   --    < > = values in this interval not displayed.   Liver Function Tests: Recent Labs  Lab 03/01/20 0427 03/01/20 1539 03/02/20 0222  ALBUMIN 3.1* 3.1* 3.3*   No results for input(s): LIPASE, AMYLASE in the last 168 hours. No results for input(s): AMMONIA in the last 168 hours. CBC: Recent Labs  Lab 02/28/20 1753 02/28/20 1952 02/29/20 0411 02/29/20 0423 02/29/20 1635 02/29/20 1645 03/01/20 0427 03/01/20 0803 03/01/20 1539 03/01/20 1946 03/02/20 0222 03/02/20 0223 03/02/20 0835  WBC 12.0*   < > 7.6   < > 9.5   < > 8.1  --  10.3  --  7.3  --   --   NEUTROABS 9.5*  --   --   --   --   --   --   --   --   --   --   --   --   HGB 8.8*   < > 7.7*   < > 9.1*   < > 9.2*   < > 9.9*   < > 9.1* 9.5* 10.5*  HCT 28.9*   < > 25.0*   < > 29.5*   < > 29.8*   < > 32.1*   < > 29.0* 28.0* 31.0*  MCV 102.5*   < > 103.7*  --  102.1*  --  99.0  --  98.5  --  98.6  --   --   PLT 113*   < > 90*   < > 93*   < > 97*  --  104*  --  99*  --   --    < > = values in this interval not displayed.   Cardiac Enzymes: No results for input(s): CKTOTAL, CKMB, CKMBINDEX, TROPONINI in the last 168 hours. CBG: Recent Labs  Lab 03/01/20 1528 03/01/20 1944 03/01/20 2343 03/02/20 0352 03/02/20 0721  GLUCAP 194* 230* 163* 184* 205*    Iron Studies: No results for input(s): IRON, TIBC, TRANSFERRIN, FERRITIN in the last 72 hours. Studies/Results: DG CHEST PORT 1 VIEW  Result Date: 03/01/2020 CLINICAL DATA:  Respiratory failure with ECMO treatment EXAM: PORTABLE CHEST 1 VIEW COMPARISON:  February 29, 2020 FINDINGS: Tracheostomy catheter tip is 6.6 cm above the carina. ECMO catheter tip in inferior vena cava. Left jugular catheter tip is in the superior vena cava. No pneumothorax. Widespread  airspace opacity persists throughout the lungs without change. There is cardiomegaly, stable. Pulmonary vascularity appears grossly within normal limits, limited in visualization due to the overlying airspace consolidation. No bone lesions. IMPRESSION: Stable widespread airspace opacity bilaterally. Stable cardiac prominence. Tube and catheter positions as described. No pneumothorax evident. Electronically Signed   By: Lowella Grip III M.D.   On: 03/01/2020 08:11   . amiodarone  200 mg Per Tube Daily  . vitamin C  500 mg Per Tube Daily  . chlorhexidine gluconate (MEDLINE KIT)  15 mL Mouth Rinse BID  . Chlorhexidine Gluconate Cloth  6 each Topical Q0600  . clonazePAM  1 mg Per Tube Q6H  . collagenase   Topical Daily  . docusate  100 mg Per Tube BID  . feeding supplement (PROSource TF)  45 mL Per Tube QID  . Gerhardt's butt cream   Topical BID  . insulin aspart  0-15  Units Subcutaneous Q4H  . insulin aspart  15 Units Subcutaneous Q4H  . insulin detemir  60 Units Subcutaneous BID  . linagliptin  5 mg Per Tube Daily  . mouth rinse  15 mL Mouth Rinse 10 times per day  . melatonin  9 mg Per Tube QHS  . metoCLOPramide (REGLAN) injection  5 mg Intravenous Q8H  . midodrine  10 mg Per Tube TID  . multivitamin  1 tablet Per Tube QHS  . oxyCODONE  10 mg Per Tube Q6H  . pantoprazole (PROTONIX) IV  40 mg Intravenous BID  . pneumococcal 23 valent vaccine  0.5 mL Intramuscular Tomorrow-1000  . polyethylene glycol  17 g Per Tube Daily  . QUEtiapine  100 mg Per Tube QHS  . sennosides  5 mL Per Tube BID  . zinc sulfate  220 mg Per Tube Daily    BMET    Component Value Date/Time   NA 136 03/02/2020 0835   K 3.5 03/02/2020 0835   CL 99 03/02/2020 0222   CO2 24 03/02/2020 0222   GLUCOSE 207 (H) 03/02/2020 0222   BUN 44 (H) 03/02/2020 0222   CREATININE 1.02 (H) 03/02/2020 0222   CALCIUM 9.1 03/02/2020 0222   GFRNONAA >60 03/02/2020 0222   GFRAA >60 03/02/2020 0222   CBC    Component Value  Date/Time   WBC 7.3 03/02/2020 0222   RBC 2.94 (L) 03/02/2020 0222   HGB 10.5 (L) 03/02/2020 0835   HCT 31.0 (L) 03/02/2020 0835   PLT 99 (L) 03/02/2020 0222   MCV 98.6 03/02/2020 0222   MCH 31.0 03/02/2020 0222   MCHC 31.4 03/02/2020 0222   RDW 20.4 (H) 03/02/2020 0222   LYMPHSABS 1.0 02/28/2020 1753   MONOABS 0.8 02/28/2020 1753   EOSABS 0.4 02/28/2020 1753   BASOSABS 0.1 02/28/2020 1753     Assessment/Plan:  1. Anuric AKI in setting of ischemic ATN related to septic shock from COVD-19 PNA requiring VV ECMO. Started on CRRT 8/22-02/12/20 and transitioned to IHD on 02/13/20 but back to CRRT on 02/15/20 due to volume overload and large IVF intake.  1. Tolerating CRRT and will keep even today 2. No changes to CRRT today. 2. Vascular access- L IJ trialysis catheter placed 02/15/20.No issues for now. 3. Hypophosphatemia- due to CRRT-will re-order replacement phos today and follow. 4. Shock due to COVID-19 infection diagnosed 01/29/2020 as well as enterococcal bacteremia- completed zosyn 02/13/20, negative TTE. Off of pressors. 5. Acute hypoxic/hypercapneic respiratory failure in setting of severe covid PNA/ARDS- now on VV ECMO per PCCM/CHF. Lung function slowly returning. UF as bp tolerates. 6. ICH- stable by CT scan 8/14. Likely will have long-term deficits. 7. Thrombocytopenia- stable 8. Morbid obesity 9. DM type 2 10. P. Atrial fibrillation Donetta Potts, MD [facility] [phone]

## 2020-03-02 NOTE — Progress Notes (Signed)
Patient having significant amounts of chugging issues affecting flows on ECMO circuit tonight. Thus far, patient has required 4 albumins to maintain >4L flow on ECMO circuit. Began the night on 4000 RPM flowing 4.8/4.9 LPM and now on 3600 RPM flowing 4.4LPM. One chugging event was triggered by repositioning patient- once repositioned back to original position, chugging did not stop. Cannulas not kinked, no clots or fibrin noted within ECMO circuit other than minimal fibrin at connector sites.   HR 80-90's, BP stable. CRRT is just filtering, not pulling fluid at the present moment. No evidence of bleeding. Sats are maintaining at lower flow. Patient was negative 1 Liter for dayshift. Nothing to suggest that cannula position has moved.   Discussed with Dr. Valeta Harms- MD recommended sending AM labs early, continue ECMO at lower flow as tolerated and to continue giving albumins as evidenced by chugging and lowered flows on ECMO circuit. Will continue to update MD.

## 2020-03-02 NOTE — Progress Notes (Signed)
Centrimag flow probe moved.

## 2020-03-02 NOTE — Progress Notes (Signed)
[place] for Heparin Indication: ECMO  Allergies  Allergen Reactions  . Sulfa Antibiotics Rash and Other (See Comments)    Patient Measurements: Height: 5\' 4"  (162.6 cm) Weight: 114 kg (251 lb 5.2 oz) IBW/kg (Calculated) : 54.7 Heparin Dosing Weight: 87 kg  Vital Signs: Temp: 98.1 F (36.7 C) (09/25 0000) Temp Source: Oral (09/25 0000) BP: 112/59 (09/25 0300) Pulse Rate: 87 (09/25 0300)  Labs: Recent Labs    03/01/20 0427 03/01/20 0803 03/01/20 1539 03/01/20 1539 03/01/20 1946 03/01/20 1946 03/02/20 0222 03/02/20 0223  HGB 9.2*   < > 9.9*   < > 9.9*   < > 9.1* 9.5*  HCT 29.8*   < > 32.1*   < > 29.0*  --  29.0* 28.0*  PLT 97*  --  104*  --   --   --  99*  --   HEPARINUNFRC 0.14*  --  0.22*  --   --   --  0.14*  --   CREATININE 1.04*  --  1.08*  --   --   --  1.02*  --    < > = values in this interval not displayed.    Estimated Creatinine Clearance: 77.1 mL/min (A) (by C-G formula based on SCr of 1.02 mg/dL (H)).  Assessment: 54 yo female COVID+ on VV ECMO. Patient started on bivalirudin but was found to have multiple ICH on 8/13 head CT. Bivalirudin was stopped and pt was off anticoagulation at that time. Her ECMO circuit was changed 8/24. Pharmacy asked to start low-fixed rate heparin.   Heparin was started at low fixed rate for pump patency in setting of ICH. Concern with increased clotting off CRRT as well as circuit clots - s/p circuit exchange 9/17 so will titrate heparin to a low goal of 0.2-0.3  9/25 AM update:  Heparin level slightly low this AM No issues per RN   Goal of Therapy:  HL 0.2-0.3 units/mL Monitor platelets by anticoagulation protocol: Yes   Plan:  Increase IV heparin to 1250 units/hr  Monitor q12hr HL, CBC/plt Monitor for signs/symptoms of bleeding   Narda Bonds, PharmD, BCPS Clinical Pharmacist Phone: [phone]

## 2020-03-02 NOTE — Procedures (Signed)
Extracorporeal support note  ECLS support day: Cannulated 01/14/2020, Day 46 Indication: COVID 19 ARDS   Configuration: ECMO Mode: VV  Drainage cannula: RIJ Crescent  Return cannula: Same   Pump speed: 3700 rpm  Pump flow: Flow (LPM): 5.69  Pump used: ECMO Device: Centrimag  Oxygenator: Quadrox O2 blender: 100% Sweep gas: 5.5L  Circuit check: tiny clots in oxygenator  Case discussed with ECMO specialist at bedside Anticoagulant: Heparin Anticoagulation targets: Anticoagulation Goal: Hep 0.2-0.3  Changes in support: Continue sweep weans  Anticipated goals/duration of support: Bridge to recovery  All issues highlighted discussed on multidisciplinary team rounding.  Candee Furbish, MD [facility] 03/02/2020 1:35 PM

## 2020-03-02 NOTE — Progress Notes (Signed)
Family requesting to having a family meeting with MD's on Monday (9/27) to discuss patients current state and what they should expect in the days to months to come. Will pass along to next shift and address in morning rounds tomorrow. Daughter will call tomorrow morning to find out a time that works for MD's.

## 2020-03-02 NOTE — Progress Notes (Signed)
Physical Therapy Wound Treatment Patient Details  Name: Alisha Stephenson MRN: [id number] Date of Birth: [birth year]  Today's Date: 03/02/2020 Time: 1152-1229 Time Calculation (min): 37 min  Subjective  Subjective: Non-verbal throughout Patient and Family Stated Goals: Heal wound - per husband Date of Onset:  (Unknown)  Pain Score: 8/10 (premedicated)  Wound Assessment     Pressure Injury 02/21/20 Sacrum Posterior Unstageable - Full thickness tissue loss in which the base of the injury is covered by slough (yellow, tan, gray, green or brown) and/or eschar (tan, brown or black) in the wound bed. Unstageable Sacral Wound (Active)  Wound Image   02/28/20 1358  Dressing Type ABD;Barrier Film (skin prep);Gauze (Comment);Moist to moist 03/02/20 1628  Dressing Changed;Clean;Dry;Intact 03/02/20 1628  Dressing Change Frequency Daily 03/02/20 1628  State of Healing Eschar 03/02/20 1628  Site / Wound Assessment Brown;Bleeding;Black;Painful 03/02/20 1628  % Wound base Red or Granulating 20% 03/02/20 1628  % Wound base Yellow/Fibrinous Exudate 40% 03/02/20 1628  % Wound base Black/Eschar 40% 03/02/20 1628  % Wound base Other/Granulation Tissue (Comment) 0% 03/02/20 1628  Peri-wound Assessment Erythema (blanchable) 03/02/20 1628  Wound Length (cm) 7.5 cm 02/28/20 1358  Wound Width (cm) 7.5 cm 02/28/20 1358  Wound Depth (cm) 0.2 cm 02/28/20 1358  Wound Surface Area (cm^2) 56.25 cm^2 02/28/20 1358  Wound Volume (cm^3) 11.25 cm^3 02/28/20 1358  Margins Unattached edges (unapproximated) 03/02/20 1628  Drainage Amount Moderate 03/02/20 1628  Drainage Description Serosanguineous 03/02/20 1628  Treatment Debridement (Selective);Hydrotherapy (Pulse lavage);Packing (Saline gauze) 03/02/20 1628   Santyl applied to wound bed prior to applying dressing.   Hydrotherapy Pulsed lavage therapy - wound location: Sacrum Pulsed Lavage with Suction (psi): 12 psi Pulsed Lavage with Suction - Normal Saline Used:  1000 mL Pulsed Lavage Tip: Tip with splash shield Selective Debridement Selective Debridement - Location: Sacrum Selective Debridement - Tools Used: Forceps;Scalpel Selective Debridement - Tissue Removed: Eschar and non-viable tissue   Wound Assessment and Plan  Wound Therapy - Assess/Plan/Recommendations Wound Therapy - Clinical Statement: Eschar continuing to soften. Pt continues with pain with pulsatile lavage and sharp debridement despite premedication. Will continue to follow with hydrotherapy for selective removal of non-viable tissue, to decrease bioburden, and promote wound bed healing.  Wound Therapy - Functional Problem List: Decreased general mobility, on ECMO, CRRT, vent Factors Delaying/Impairing Wound Healing: Immobility;Multiple medical problems Hydrotherapy Plan: Debridement;Dressing change;Patient/family education;Pulsatile lavage with suction Wound Therapy - Frequency: 6X / week Wound Therapy - Follow Up Recommendations: Skilled nursing facility (vs LTACH) Wound Plan: See above  Wound Therapy Goals- Improve the function of patient's integumentary system by progressing the wound(s) through the phases of wound healing (inflammation - proliferation - remodeling) by: Decrease Necrotic Tissue to: 20% Decrease Necrotic Tissue - Progress: Progressing toward goal Increase Granulation Tissue to: 80% Increase Granulation Tissue - Progress: Progressing toward goal Goals/treatment plan/discharge plan were made with and agreed upon by patient/family: Yes Time For Goal Achievement: 7 days Wound Therapy - Potential for Goals: Fair  Goals will be updated until maximal potential achieved or discharge criteria met.  Discharge criteria: when goals achieved, discharge from hospital, MD decision/surgical intervention, no progress towards goals, refusal/missing three consecutive treatments without notification or medical reason.  GP      [name], PT, DPT Acute Rehabilitation  Services Pager [phone] Office [phone]   Deno Etienne 03/02/2020, 4:31 PM

## 2020-03-02 NOTE — Progress Notes (Signed)
NAME:  Alisha Stephenson, MRN:  [id number], DOB:  May 28, 1966, LOS: 23 ADMISSION DATE:  01/11/2020, CONSULTATION DATE:  8/3 REFERRING MD:  Alfredia Ferguson, CHIEF COMPLAINT:  Dyspnea   Brief History   54 y/o female admitted on 8/2 with severe acute respiratory failure with hypoxemia due to COVID 19 pneumonia.  She developed symptoms 1 week prior to admission.  Past Medical History  DM2 Diverticulitis Gallstones Ovarian cyst NAFLD Asthma  Significant Hospital Events   8/2 admit 8/3 ICU transfer, intubated 8/4 prone, paralyze 8/9 significant desaturations today 8/9 VV ECMO cannulation 8/13 ICH 8/30 trach  Consults:  PCCM ECMO team General surgery 02/27/2020 for sacral wound care possible debridement  Procedures:  8/3 ETT > 8/30 8/30 8-0 shiley trach 8/3 PICC >  8/9 LIJ MML 8/9 RIJ Crescent 79F  9/8 trialysis 9/10 PEG-J (IR)  Significant Diagnostic Tests:  7/31 CT head > NAICP 7/31 MRI/MRA brain > no acute changes, possibly small aneurysm ACOM 8/13 CT head> multiple areas of ICH  Micro Data:  8/2 blood > NG 8/2 SARS COV 2 > positive 8/4 resp > negative 8/4 urine >  8/12 blood > E. faecalis (pan-sensitive) 8/12 resp: staph aureus> MSSA 8/25 blood>> NG 8/24 BAL> yeast  Antimicrobials:  See fever tab for past abx Current: none  Interim history/subjective:  No events. Some question of if she wanted to be taken off life support last night, clarification was that she was having a nightmare and was delirious after waking up.  Objective   Blood pressure 118/60, pulse 90, temperature 97.6 F (36.4 C), temperature source Oral, resp. rate (!) 24, height _0  (1.626 m), weight 116.1 kg, SpO2 98 %.    Vent Mode: PCV FiO2 (%):  [40 %] 40 % Set Rate:  [15 bmp-24 bmp] 24 bmp PEEP:  [10 cmH20] 10 cmH20 Plateau Pressure:  [21 cmH20-32 cmH20] 24 cmH20   Intake/Output Summary (Last 24 hours) at 03/02/2020 1328 Last data filed at 03/02/2020 1300 Gross per 24 hour  Intake 3870.62 ml   Output 3583 ml  Net 287.62 ml   Filed Weights   02/29/20 0500 03/01/20 0500 03/02/20 0500  Weight: 112.8 kg 114 kg 116.1 kg    Examination:  GEN: Obese female on mechanical life support, VV ECMO in place, right IJ cannulation HEENT: NCAT, tracking appropriately, following commands Neck: Tracheostomy tube in place, CDI, less bloody secretions CV: Regular rate rhythm, S1-S2 PULM: Bilateral mechanically ventilated breath sounds GI: Soft nontender nondistended obese pannus EXT: Right upper extremity distal gangrene of the thumb and forefinger, stable NEURO: Alert oriented following commands PSYCH: Calm, interactive  Pressure Injury 02/14/20 Anus Medial Deep Tissue Pressure Injury - Purple or maroon localized area of discolored intact skin or blood-filled blister due to damage of underlying soft tissue from pressure and/or shear. (Active)  02/14/20 1300  Location: Anus  Location Orientation: Medial  Staging: Deep Tissue Pressure Injury - Purple or maroon localized area of discolored intact skin or blood-filled blister due to damage of underlying soft tissue from pressure and/or shear.  Wound Description (Comments):   Present on Admission: No     Pressure Injury Back Left;Mid Stage 2 -  Partial thickness loss of dermis presenting as a shallow open injury with a red, pink wound bed without slough. in skin fold on back (Active)     Location: Back  Location Orientation: Left;Mid  Staging: Stage 2 -  Partial thickness loss of dermis presenting as a shallow open injury with a red,  pink wound bed without slough.  Wound Description (Comments): in skin fold on back  Present on Admission: No     Pressure Injury 02/21/20 Sacrum Posterior Unstageable - Full thickness tissue loss in which the base of the injury is covered by slough (yellow, tan, gray, green or brown) and/or eschar (tan, brown or black) in the wound bed. Unstageable Sacral Wound (Active)  02/21/20 1206  Location: Sacrum   Location Orientation: Posterior  Staging: Unstageable - Full thickness tissue loss in which the base of the injury is covered by slough (yellow, tan, gray, green or brown) and/or eschar (tan, brown or black) in the wound bed.  Wound Description (Comments): Unstageable Sacral Wound  Present on Admission: No   Labs reviewed with ECMO team at bedside on multidisciplinary rounds  Chest x-ray: 03/02/2020: Bilateral persistent infiltrates, stable lines and tubes The patient's images have been independently reviewed by me.    ABG respiratory insufficiency Chemistries fine Lactate improved Hgb stable with transfusions WBC normal Plts 113>>90>>97>>99 LDH 284>>268>>281>>262 Fibrinogen 675>>658>>734>>718  Resolved Hospital Problem list   MSSA pneumonia Fungal pneumonia- Candida dublinensis Enterococcal bacteremia-recent blood cultures cleared Concern for possible embolic phenomenon- fingers and possibly CVAs were embolic  Assessment & Plan:   Critically ill due to Acute Hypxemic and AoC hypercapneic respiratory failure;  - likely underly OHS.   Requiring VV ECMO support and mechanical ventilation.  ARDS due to COVID 19 pneumonia, slowly resolving, now likely fibrotic stage disease  Tracheostomy in place Pseudomonas and serratia HCAP -Continue VV ECMO support -Continue to titrate down sweep gas as tolerated -Discussed circuit with ECMO specialist at bedside -Continue head of bed elevated, ventilator associated pneumonia prophylaxis -Remains off of continuous sedation -Routine trach care. -Today: wean sweep, increase vent support to see how much we can go down, continue abx, try to reduce fentanyl even further during hydrotherapy to maximize her respiratory drive  ICH, multifocal. L-sided weakness.  Critical illness myopathy--improving slowly. -Continue SLP, PT and OT  Acute metabolic encephalopathy, improving Sundowning in the evenings -Continue delirium precautions -Encourage  appropriate sleep-wake cycles. -Continue Seroquel nightly  Acute renal failure, severe uremia.  Anuric. -Continue CVVHD per nephrology  Hypotension. Acute cor pulmonale, improved -Remains off vasopressors at this time -Continue to observe  Hyperglycemia  -Continue insulin basal bolus to maintain CBG goal of 140-180 -Increase levemir 03/02/20  Pressure Injury, Skin, sacrum- wound growing pseudomonas, serratia  -No plans for debridement at present - Continue local wound care, hydrotherapy and turning as able - WOC following  Best practice:  Diet: tube feeding Pain/Anxiety/Delirium protocol (if indicated): as above VAP protocol (if indicated): yes DVT prophylaxis: SCDs , heparin GI prophylaxis: pantoprazole Glucose control: ssi/basal Mobility: bed rest Code Status: limited Family Communication: husband at bedside and updated daily Disposition: ICU  This patient is critically ill with multiple organ system failure; which, requires frequent high complexity decision making, assessment, support, evaluation, and titration of therapies. This was completed through the application of advanced monitoring technologies and extensive interpretation of multiple databases. During this encounter critical care time was devoted to patient care services described in this note for 38 minutes.  Candee Furbish, MD [facility] 03/02/2020 1:28 PM

## 2020-03-02 NOTE — Progress Notes (Signed)
[place] for Heparin Indication: ECMO  Allergies  Allergen Reactions  . Sulfa Antibiotics Rash and Other (See Comments)    Patient Measurements: Height: 5\' 4"  (162.6 cm) Weight: 116.1 kg (255 lb 15.3 oz) IBW/kg (Calculated) : 54.7 Heparin Dosing Weight: 87 kg  Vital Signs: Temp: 97.6 F (36.4 C) (09/25 0800) Temp Source: Oral (09/25 0800) BP: 95/45 (09/25 1530) Pulse Rate: 89 (09/25 1530)  Labs: Recent Labs    03/01/20 1539 03/01/20 1946 03/02/20 0222 03/02/20 0223 03/02/20 0835 03/02/20 0835 03/02/20 0927 03/02/20 1526  HGB 9.9*   < > 9.1*   < > 10.5*   < > 9.9* 9.4*  HCT 32.1*   < > 29.0*   < > 31.0*  --  29.0* 30.0*  PLT 104*  --  99*  --   --   --   --  101*  HEPARINUNFRC 0.22*  --  0.14*  --   --   --   --  0.17*  CREATININE 1.08*  --  1.02*  --   --   --   --  0.91   < > = values in this interval not displayed.    Estimated Creatinine Clearance: 87.4 mL/min (by C-G formula based on SCr of 0.91 mg/dL).  Assessment: 54 yo female COVID+ on VV ECMO. Patient started on bivalirudin but was found to have multiple ICH on 8/13 head CT. Bivalirudin was stopped and pt was off anticoagulation at that time. Her ECMO circuit was changed 8/24. Pharmacy asked to start low-fixed rate heparin.   Heparin was started at low fixed rate for pump patency in setting of ICH. Concern with increased clotting off CRRT as well as circuit clots - s/p circuit exchange 9/17 so will titrate heparin to a low goal of 0.2-0.3  Heparin level remains subtherapeutic but trending up to 0.17, on 1250 units/hr. Hgb 9.4, plt 101. No s/sx of bleeding or infusion issues per RN.  Goal of Therapy:  HL 0.2-0.3 units/mL Monitor platelets by anticoagulation protocol: Yes   Plan:  Increase IV heparin to 1300 units/hr  Monitor q12hr HL, CBC/plt Monitor for signs/symptoms of bleeding   Antonietta Jewel, PharmD, BCCCP Clinical Pharmacist  Phone: [phone] 03/02/2020 4:25  PM  Please check AMION for all [place] phone numbers After 10:00 PM, call [place] [phone]

## 2020-03-02 NOTE — Progress Notes (Signed)
Patient ID: Alisha Stephenson, female   DOB: [birth year], 54 y.o.   MRN: [id number]    Advanced Heart Failure Rounding Note   Subjective:    - 8/2 COVID + test - 8/9 Cannulated for VV ECMO - 8/13 with several areas of intracranial hemorrhage. Bival stopped.  - 8/14 CT no change in [place]. Increased edema - 8/16 Extubated - 8/16 Head CT stable bleed - 8/22 CVVHD started - 8/24 reintubated - 8/24 circuit changed - 8/30 trach - 9/7  Switched to iHD - 02/15/20 Switched back to CVVHD - 9/10 GJ tube placed - 9/17 Circuit change  Remains awake on vent via trach. Communicative. Following commands. Working with PT. Had hydrotherapy again yesterday for large sacral wound.   Remains on CVVHD.  Had lots of chugging last night. Got several albumins. ECMO speed turned down.   Night RN reports patient saying that "she didn't want to do this any more". I talked to patient and her husband this am and she says she is scared but wants to live.   ECMO  Flow 4.5 L RPM 3700 Sweep5.5  Labs: 7.31/48/70/92% 40% FiO2 TV 350-400cc Hgb9.1 PLT 99k LDH 262 Lactic acid2.3 -> 1.5 -> 1.7 Heparin 0.14  Objective:   Weight Range:  Vital Signs:   Temp:  [97.5 F (36.4 C)-99.5 F (37.5 C)] 97.6 F (36.4 C) (09/25 0800) Pulse Rate:  [87-103] 90 (09/25 0815) Resp:  [16-27] 24 (09/25 0815) BP: (89-133)/(47-98) 129/49 (09/25 0815) SpO2:  [93 %-100 %] 94 % (09/25 0815) Arterial Line BP: (68-129)/(43-64) 128/49 (09/25 0815) FiO2 (%):  [40 %] 40 % (09/25 0815) Weight:  [116.1 kg] 116.1 kg (09/25 0500) Last BM Date: 03/02/20  Weight change: Filed Weights   02/29/20 0500 03/01/20 0500 03/02/20 0500  Weight: 112.8 kg 114 kg 116.1 kg    Intake/Output:   Intake/Output Summary (Last 24 hours) at 03/02/2020 0827 Last data filed at 03/02/2020 0800 Gross per 24 hour  Intake 3757.56 ml  Output 3799 ml  Net -41.44 ml     Physical Exam: General:  Sitting up in bed awake following commands HEENT:  normal Neck: supple. RIJ ecmo LIJ trialysis Cor: PMI nondisplaced. Regular rate & rhythm. No rubs, gallops or murmurs. Lungs: clear decreased air movement  Abdomen: obese + PEG soft, nontender, nondistended. No hepatosplenomegaly. No bruits or masses. Good bowel sounds. Extremities: no cyanosis, clubbing, rash, tr edema Neuro: alert & orientedx3 weak.  + large sacral wound  Telemetry: Sinus 90-105 Personally reviewed    Labs: Basic Metabolic Panel: Recent Labs  Lab 02/27/20 0403 02/27/20 0418 02/28/20 0435 02/28/20 0446 02/29/20 0411 02/29/20 0423 02/29/20 1635 02/29/20 1645 03/01/20 0427 03/01/20 0803 03/01/20 1531 03/01/20 1539 03/01/20 1946 03/02/20 0222 03/02/20 0223  NA 134*   < > 136   < > 134*   < > 132*   < > 132*   < > 134* 133* 135 136 136  K 4.8   < > 4.8   < > 4.5   < > 5.0   < > 4.4   < > 4.4 4.4 4.3 3.5 3.4*  CL 101   < > 101   < > 99  --  98  --  96*  --   --  99  --  99  --   CO2 25   < > 25   < > 25  --  24  --  24  --   --  23  --  24  --   GLUCOSE 208*   < > 221*   < > 174*  --  239*  --  179*  --   --  220*  --  207*  --   BUN 34*   < > 39*   < > 37*  --  38*  --  41*  --   --  42*  --  44*  --   CREATININE 1.01*   < > 1.06*   < > 1.03*  --  1.03*  --  1.04*  --   --  1.08*  --  1.02*  --   CALCIUM 8.8*   < > 9.0   < > 8.8*   < > 8.8*   < > 8.8*  --   --  8.8*  --  9.1  --   MG 2.7*  --  2.6*  --  2.8*  --   --   --  2.8*  --   --   --   --  2.7*  --   PHOS 1.9*   < > 2.4*   < > 2.0*  --  3.2  --  3.2  --   --  2.9  --  2.5  --    < > = values in this interval not displayed.    Liver Function Tests: Recent Labs  Lab 02/29/20 0411 02/29/20 1635 03/01/20 0427 03/01/20 1539 03/02/20 0222  ALBUMIN 3.1* 3.1* 3.1* 3.1* 3.3*   No results for input(s): LIPASE, AMYLASE in the last 168 hours. No results for input(s): AMMONIA in the last 168 hours.  CBC: Recent Labs  Lab 02/28/20 1753 02/28/20 1952 02/29/20 0411 02/29/20 0423 02/29/20 1635  02/29/20 1645 03/01/20 0427 03/01/20 0803 03/01/20 1531 03/01/20 1539 03/01/20 1946 03/02/20 0222 03/02/20 0223  WBC 12.0*   < > 7.6  --  9.5  --  8.1  --   --  10.3  --  7.3  --   NEUTROABS 9.5*  --   --   --   --   --   --   --   --   --   --   --   --   HGB 8.8*   < > 7.7*   < > 9.1*   < > 9.2*   < > 10.9* 9.9* 9.9* 9.1* 9.5*  HCT 28.9*   < > 25.0*   < > 29.5*   < > 29.8*   < > 32.0* 32.1* 29.0* 29.0* 28.0*  MCV 102.5*   < > 103.7*  --  102.1*  --  99.0  --   --  98.5  --  98.6  --   PLT 113*   < > 90*  --  93*  --  97*  --   --  104*  --  99*  --    < > = values in this interval not displayed.    Cardiac Enzymes: No results for input(s): CKTOTAL, CKMB, CKMBINDEX, TROPONINI in the last 168 hours.  BNP: BNP (last 3 results) No results for input(s): BNP in the last 8760 hours.  ProBNP (last 3 results) No results for input(s): PROBNP in the last 8760 hours.    Other results:  Imaging: DG CHEST PORT 1 VIEW  Result Date: 03/01/2020 CLINICAL DATA:  Respiratory failure with ECMO treatment EXAM: PORTABLE CHEST 1 VIEW COMPARISON:  February 29, 2020 FINDINGS: Tracheostomy catheter tip is 6.6 cm above the carina. ECMO  catheter tip in inferior vena cava. Left jugular catheter tip is in the superior vena cava. No pneumothorax. Widespread airspace opacity persists throughout the lungs without change. There is cardiomegaly, stable. Pulmonary vascularity appears grossly within normal limits, limited in visualization due to the overlying airspace consolidation. No bone lesions. IMPRESSION: Stable widespread airspace opacity bilaterally. Stable cardiac prominence. Tube and catheter positions as described. No pneumothorax evident. Electronically Signed   By: Lowella Grip III M.D.   On: 03/01/2020 08:11     Medications:     Scheduled Medications: . amiodarone  200 mg Per Tube Daily  . vitamin C  500 mg Per Tube Daily  . chlorhexidine gluconate (MEDLINE KIT)  15 mL Mouth Rinse BID   . Chlorhexidine Gluconate Cloth  6 each Topical Q0600  . clonazePAM  1 mg Per Tube Q6H  . collagenase   Topical Daily  . docusate  100 mg Per Tube BID  . feeding supplement (PROSource TF)  45 mL Per Tube QID  . Gerhardt's butt cream   Topical BID  . insulin aspart  0-15 Units Subcutaneous Q4H  . insulin aspart  15 Units Subcutaneous Q4H  . insulin detemir  60 Units Subcutaneous BID  . linagliptin  5 mg Per Tube Daily  . mouth rinse  15 mL Mouth Rinse 10 times per day  . melatonin  9 mg Per Tube QHS  . metoCLOPramide (REGLAN) injection  5 mg Intravenous Q8H  . midodrine  10 mg Per Tube TID  . multivitamin  1 tablet Per Tube QHS  . oxyCODONE  10 mg Per Tube Q6H  . pantoprazole (PROTONIX) IV  40 mg Intravenous BID  . pneumococcal 23 valent vaccine  0.5 mL Intramuscular Tomorrow-1000  . polyethylene glycol  17 g Per Tube Daily  . QUEtiapine  100 mg Per Tube QHS  . sennosides  5 mL Per Tube BID  . zinc sulfate  220 mg Per Tube Daily    Infusions: .  prismasol BGK 4/2.5 400 mL/hr at 03/01/20 2205  .  prismasol BGK 4/2.5 200 mL/hr at 03/01/20 2202  . sodium chloride 250 mL (03/01/20 0844)  . sodium chloride 10 mL/hr at 03/02/20 0800  . sodium chloride Stopped (02/24/20 2312)  . sodium chloride    . albumin human 60 mL/hr at 03/02/20 0205  . ceFEPime (MAXIPIME) IV Stopped (03/02/20 4492)  . feeding supplement (PIVOT 1.5 CAL) 1,000 mL (03/02/20 0300)  . heparin 10,000 units/ 20 mL infusion syringe 500 Units/hr (03/02/20 0024)  . heparin 1,250 Units/hr (03/02/20 0800)  . prismasol BGK 2/2.5 dialysis solution 1,500 mL/hr at 03/02/20 0544  . vancomycin      PRN Medications: sodium chloride, sodium chloride, sodium chloride, sodium chloride, acetaminophen (TYLENOL) oral liquid 160 mg/5 mL, albumin human, albuterol, alteplase, alum & mag hydroxide-simeth, dextrose, fentaNYL (SUBLIMAZE) injection, guaiFENesin-dextromethorphan, heparin, heparin, heparin, hydrALAZINE, HYDROmorphone  (DILAUDID) injection, hydrOXYzine, lidocaine (PF), lidocaine-prilocaine, ondansetron **OR** ondansetron (ZOFRAN) IV, pentafluoroprop-tetrafluoroeth, silver nitrate applicators, Zinc Oxide   Assessment/Plan:   1. Acute hypoxic/hypercapneic respiratory failure in setting of severe COVID PNA/ARDS -> VV ECMO - admit 8/2 - intubation 8/3 - has received actmera (compelted 8/2), remdesivir (completed 8/6) and steroids - Cannulated for VV ECMO on 8/9 - Extubated 8/16. Reintubated 8/24 - s/p trach 8/30. - Circuit changed 8/24 due to concern for hemolysis and worsening oxygenation. Repeat circuit change 9/17 - CVVHD started 8/22 for volume removal and uremia. Remains anuric. CXR worse on 9/9 so switched back to CVVHD. Now  keeping at -25-50/hr. D/w Renal. No change.  - Off IV sedation. Continue melatonin and seroquel for sleep at night. Adjusted to help with restlessness and sundowning at night - Stable off pressors. On midodrine 10 tid. Has wide pulse pressure but no AI on echo.  - Lung function returning very slowly. I suspect major issue remains respiratory muscle weakness and poor lung compliance but still with diffuse infiltrates suggestive of fibrosis. Continue to wean sweep. May have to rely more on vent to wean ECMO. . If unable to get sweep down further will need chest CT in next few days. Now on abx to cover potential PNA. Await speciation  - Continues with slow recovery. Per night RN reports patient saying that "she didn't want to do this any more". I talked to patient and her husband this am and she says she is scared but wants to live. Continue full support.  - Continue heparin with level 0.2-0.3 Discussed dosing with PharmD personally.  2. Enterococcus sepsis - Finished high-dose zosyn on 9/7 per ID - Eraxis added on 8/26 with yeast in BAL 8/24 (ended 9/2)  - F/u cx 9/7 No growth - starting abx as above  3. Intracranial hemorrhage - ? Septic emboli - repeat head CT on 8/14 with stable  bleeds but increased edema - neurology has seen. Suspect significant long-term injury sustained. Will follow commands. Appears to have dense LUE weakness and possibly LLE - repeat head CT stable 8/16 - tolerating heparin. No change. Heparin level 0.14  4. Thrombocytopenia - PLTs stable around 90-100k - Continue to follow.   5. Morbid obesity - Body mass index is Body mass index is 43.93 kg/m.  6. Poorly controlled DM2 - HgBA1c 10.7 - IV insulin converted to SQ insulin on 9/15  7. PAF - intermittent episodes. Last 8/26 - Now on po. Quiescent   8. AKI/azotemia - CVVHD started on 8/22 - Down 70 pounds.  - transitioned to [facility] on 9/7 - switched back to CVVHD for better volume removal on 9/9. - new catheter placed 9/9 - will eventually need tunneled access - Lots of chugging last night.  - Keep even today - D/w Renal at bedside  9. Sacral decub  - large unstageable. Seen by [facility]. Not candidate for debridement at this point.  - for hydrotherapy day #4 today - abx as above  10. Necrotic fingers on R - VVS following - suspect will auto amputate  11. Anemia - Transfuse hgb < 8   CRITICAL CARE Performed by: Glori Bickers  Total critical care time: 35 minutes  Critical care time was exclusive of separately billable procedures and treating other patients.  Critical care was necessary to treat or prevent imminent or life-threatening deterioration.  Critical care was time spent personally by me (independent of midlevel providers or residents) on the following activities: development of treatment plan with patient and/or surrogate as well as nursing, discussions with consultants, evaluation of patient's response to treatment, examination of patient, obtaining history from patient or surrogate, ordering and performing treatments and interventions, ordering and review of laboratory studies, ordering and review of radiographic studies, pulse oximetry and re-evaluation of  patient's condition.   Length of Stay: [place]  MD 03/02/2020, 8:27 AM  Advanced Heart Failure Team Pager [phone] (M-F; [place])  Please contact [facility] for night-coverage after hours (4p -7a ) and weekends on [email or web address]

## 2020-03-03 ENCOUNTER — Inpatient Hospital Stay (HOSPITAL_COMMUNITY): Payer: PRIVATE HEALTH INSURANCE

## 2020-03-03 DIAGNOSIS — Z515 Encounter for palliative care: Secondary | ICD-10-CM

## 2020-03-03 LAB — HEPARIN LEVEL (UNFRACTIONATED)
Heparin Unfractionated: 0.19 IU/mL — ABNORMAL LOW (ref 0.30–0.70)
Heparin Unfractionated: 0.25 IU/mL — ABNORMAL LOW (ref 0.30–0.70)

## 2020-03-03 LAB — POCT I-STAT 7, (LYTES, BLD GAS, ICA,H+H)
Acid-Base Excess: 0 mmol/L (ref 0.0–2.0)
Acid-base deficit: 1 mmol/L (ref 0.0–2.0)
Acid-base deficit: 2 mmol/L (ref 0.0–2.0)
Acid-base deficit: 2 mmol/L (ref 0.0–2.0)
Acid-base deficit: 2 mmol/L (ref 0.0–2.0)
Acid-base deficit: 1 mmol/L (ref 0.0–2.0)
Bicarbonate: 25.7 mmol/L (ref 20.0–28.0)
Bicarbonate: 25.9 mmol/L (ref 20.0–28.0)
Bicarbonate: 25 mmol/L (ref 20.0–28.0)
Bicarbonate: 24.9 mmol/L (ref 20.0–28.0)
Bicarbonate: 25 mmol/L (ref 20.0–28.0)
Bicarbonate: 25.3 mmol/L (ref 20.0–28.0)
Calcium, Ion: 1.27 mmol/L (ref 1.15–1.40)
Calcium, Ion: 1.29 mmol/L (ref 1.15–1.40)
Calcium, Ion: 1.33 mmol/L (ref 1.15–1.40)
Calcium, Ion: 1.28 mmol/L (ref 1.15–1.40)
Calcium, Ion: 1.28 mmol/L (ref 1.15–1.40)
Calcium, Ion: 1.27 mmol/L (ref 1.15–1.40)
HCT: 30 % — ABNORMAL LOW (ref 36.0–46.0)
HCT: 31 % — ABNORMAL LOW (ref 36.0–46.0)
HCT: 30 % — ABNORMAL LOW (ref 36.0–46.0)
HCT: 29 % — ABNORMAL LOW (ref 36.0–46.0)
HCT: 30 % — ABNORMAL LOW (ref 36.0–46.0)
HCT: 28 % — ABNORMAL LOW (ref 36.0–46.0)
Hemoglobin: 10.2 g/dL — ABNORMAL LOW (ref 12.0–15.0)
Hemoglobin: 10.5 g/dL — ABNORMAL LOW (ref 12.0–15.0)
Hemoglobin: 10.2 g/dL — ABNORMAL LOW (ref 12.0–15.0)
Hemoglobin: 9.9 g/dL — ABNORMAL LOW (ref 12.0–15.0)
Hemoglobin: 10.2 g/dL — ABNORMAL LOW (ref 12.0–15.0)
Hemoglobin: 9.5 g/dL — ABNORMAL LOW (ref 12.0–15.0)
O2 Saturation: 95 %
O2 Saturation: 93 %
O2 Saturation: 94 %
O2 Saturation: 91 %
O2 Saturation: 92 %
O2 Saturation: 95 %
Patient temperature: 97.9
Patient temperature: 97.5
Patient temperature: 97.8
Patient temperature: 97.8
Patient temperature: 98.1
Patient temperature: 98.2
Potassium: 3.4 mmol/L — ABNORMAL LOW (ref 3.5–5.1)
Potassium: 3.4 mmol/L — ABNORMAL LOW (ref 3.5–5.1)
Potassium: 3.5 mmol/L (ref 3.5–5.1)
Potassium: 3.6 mmol/L (ref 3.5–5.1)
Potassium: 3.5 mmol/L (ref 3.5–5.1)
Potassium: 3.5 mmol/L (ref 3.5–5.1)
Sodium: 136 mmol/L (ref 135–145)
Sodium: 137 mmol/L (ref 135–145)
Sodium: 137 mmol/L (ref 135–145)
Sodium: 137 mmol/L (ref 135–145)
Sodium: 137 mmol/L (ref 135–145)
Sodium: 137 mmol/L (ref 135–145)
TCO2: 27 mmol/L (ref 22–32)
TCO2: 27 mmol/L (ref 22–32)
TCO2: 27 mmol/L (ref 22–32)
TCO2: 26 mmol/L (ref 22–32)
TCO2: 27 mmol/L (ref 22–32)
TCO2: 27 mmol/L (ref 22–32)
pCO2 arterial: 47.8 mmHg (ref 32.0–48.0)
pCO2 arterial: 49.8 mmHg — ABNORMAL HIGH (ref 32.0–48.0)
pCO2 arterial: 51.1 mmHg — ABNORMAL HIGH (ref 32.0–48.0)
pCO2 arterial: 48.9 mmHg — ABNORMAL HIGH (ref 32.0–48.0)
pCO2 arterial: 50.5 mmHg — ABNORMAL HIGH (ref 32.0–48.0)
pCO2 arterial: 48.8 mmHg — ABNORMAL HIGH (ref 32.0–48.0)
pH, Arterial: 7.338 — ABNORMAL LOW (ref 7.350–7.450)
pH, Arterial: 7.321 — ABNORMAL LOW (ref 7.350–7.450)
pH, Arterial: 7.296 — ABNORMAL LOW (ref 7.350–7.450)
pH, Arterial: 7.312 — ABNORMAL LOW (ref 7.350–7.450)
pH, Arterial: 7.302 — ABNORMAL LOW (ref 7.350–7.450)
pH, Arterial: 7.321 — ABNORMAL LOW (ref 7.350–7.450)
pO2, Arterial: 80 mmHg — ABNORMAL LOW (ref 83.0–108.0)
pO2, Arterial: 72 mmHg — ABNORMAL LOW (ref 83.0–108.0)
pO2, Arterial: 80 mmHg — ABNORMAL LOW (ref 83.0–108.0)
pO2, Arterial: 66 mmHg — ABNORMAL LOW (ref 83.0–108.0)
pO2, Arterial: 72 mmHg — ABNORMAL LOW (ref 83.0–108.0)
pO2, Arterial: 82 mmHg — ABNORMAL LOW (ref 83.0–108.0)

## 2020-03-03 LAB — RENAL FUNCTION PANEL
Albumin: 3.3 g/dL — ABNORMAL LOW (ref 3.5–5.0)
Albumin: 3 g/dL — ABNORMAL LOW (ref 3.5–5.0)
Anion gap: 13 (ref 5–15)
Anion gap: 13 (ref 5–15)
BUN: 44 mg/dL — ABNORMAL HIGH (ref 6–20)
BUN: 42 mg/dL — ABNORMAL HIGH (ref 6–20)
CO2: 23 mmol/L (ref 22–32)
CO2: 22 mmol/L (ref 22–32)
Calcium: 9.4 mg/dL (ref 8.9–10.3)
Calcium: 8.9 mg/dL (ref 8.9–10.3)
Chloride: 98 mmol/L (ref 98–111)
Chloride: 99 mmol/L (ref 98–111)
Creatinine, Ser: 0.87 mg/dL (ref 0.44–1.00)
Creatinine, Ser: 0.95 mg/dL (ref 0.44–1.00)
GFR calc Af Amer: >60 mL/min (ref >60)
GFR calc Af Amer: >60 mL/min (ref >60)
GFR calc non Af Amer: >60 mL/min (ref >60)
GFR calc non Af Amer: >60 mL/min (ref >60)
Glucose, Bld: 222 mg/dL — ABNORMAL HIGH (ref 70–99)
Glucose, Bld: 202 mg/dL — ABNORMAL HIGH (ref 70–99)
Phosphorus: 2.7 mg/dL (ref 2.5–4.6)
Phosphorus: 3 mg/dL (ref 2.5–4.6)
Potassium: 3.3 mmol/L — ABNORMAL LOW (ref 3.5–5.1)
Potassium: 3.4 mmol/L — ABNORMAL LOW (ref 3.5–5.1)
Sodium: 134 mmol/L — ABNORMAL LOW (ref 135–145)
Sodium: 134 mmol/L — ABNORMAL LOW (ref 135–145)

## 2020-03-03 LAB — CULTURE, RESPIRATORY W GRAM STAIN

## 2020-03-03 LAB — CBC
HCT: 30.1 % — ABNORMAL LOW (ref 36.0–46.0)
HCT: 29.5 % — ABNORMAL LOW (ref 36.0–46.0)
Hemoglobin: 9.3 g/dL — ABNORMAL LOW (ref 12.0–15.0)
Hemoglobin: 9.1 g/dL — ABNORMAL LOW (ref 12.0–15.0)
MCH: 30 pg (ref 26.0–34.0)
MCH: 30.1 pg (ref 26.0–34.0)
MCHC: 30.9 g/dL (ref 30.0–36.0)
MCHC: 30.8 g/dL (ref 30.0–36.0)
MCV: 97.1 fL (ref 80.0–100.0)
MCV: 97.7 fL (ref 80.0–100.0)
Platelets: 104 10*3/uL — ABNORMAL LOW (ref 150–400)
Platelets: 107 10*3/uL — ABNORMAL LOW (ref 150–400)
RBC: 3.1 MIL/uL — ABNORMAL LOW (ref 3.87–5.11)
RBC: 3.02 MIL/uL — ABNORMAL LOW (ref 3.87–5.11)
RDW: 20 % — ABNORMAL HIGH (ref 11.5–15.5)
RDW: 20 % — ABNORMAL HIGH (ref 11.5–15.5)
WBC: 7.2 10*3/uL (ref 4.0–10.5)
WBC: 8.1 10*3/uL (ref 4.0–10.5)
nRBC: 0.3 % — ABNORMAL HIGH (ref 0.0–0.2)
nRBC: 0.4 % — ABNORMAL HIGH (ref 0.0–0.2)

## 2020-03-03 LAB — GLUCOSE, CAPILLARY
Glucose-Capillary: 134 mg/dL — ABNORMAL HIGH (ref 70–99)
Glucose-Capillary: 174 mg/dL — ABNORMAL HIGH (ref 70–99)
Glucose-Capillary: 192 mg/dL — ABNORMAL HIGH (ref 70–99)
Glucose-Capillary: 201 mg/dL — ABNORMAL HIGH (ref 70–99)
Glucose-Capillary: 201 mg/dL — ABNORMAL HIGH (ref 70–99)
Glucose-Capillary: 207 mg/dL — ABNORMAL HIGH (ref 70–99)
Glucose-Capillary: 214 mg/dL — ABNORMAL HIGH (ref 70–99)

## 2020-03-03 LAB — LACTIC ACID, PLASMA
Lactic Acid, Venous: 2 mmol/L (ref 0.5–1.9)
Lactic Acid, Venous: 1.5 mmol/L (ref 0.5–1.9)

## 2020-03-03 LAB — LACTATE DEHYDROGENASE: LDH: 248 U/L — ABNORMAL HIGH (ref 98–192)

## 2020-03-03 LAB — FIBRINOGEN: Fibrinogen: 701 mg/dL — ABNORMAL HIGH (ref 210–475)

## 2020-03-03 LAB — PREPARE RBC (CROSSMATCH)

## 2020-03-03 LAB — MAGNESIUM: Magnesium: 2.7 mg/dL — ABNORMAL HIGH (ref 1.7–2.4)

## 2020-03-03 MED ORDER — OXYCODONE HCL 5 MG/5ML PO SOLN
10.0000 mg | ORAL | Status: DC
  Administered 2020-03-03 – 2020-03-04 (×5): 10 mg
  Filled 2020-03-03 (×5): qty 10

## 2020-03-03 MED ORDER — SODIUM BICARBONATE 8.4 % IV SOLN
INTRAVENOUS | Status: AC
  Filled 2020-03-03: qty 50

## 2020-03-03 MED ORDER — ALBUMIN HUMAN 5 % IV SOLN
INTRAVENOUS | Status: AC
  Filled 2020-03-03: qty 250

## 2020-03-03 MED ORDER — PHENYLEPHRINE 40 MCG/ML (10ML) SYRINGE FOR IV PUSH (FOR BLOOD PRESSURE SUPPORT)
PREFILLED_SYRINGE | INTRAVENOUS | Status: AC
  Filled 2020-03-03: qty 10

## 2020-03-03 MED ORDER — FENTANYL 25 MCG/HR TD PT72
1.0000 | MEDICATED_PATCH | TRANSDERMAL | Status: DC
  Administered 2020-03-03: 1 via TRANSDERMAL
  Filled 2020-03-03: qty 1

## 2020-03-03 NOTE — Progress Notes (Signed)
[place] for Heparin Indication: ECMO  Allergies  Allergen Reactions  . Sulfa Antibiotics Rash and Other (See Comments)    Patient Measurements: Height: 5\' 4"  (162.6 cm) Weight: 112.6 kg (248 lb 3.8 oz) IBW/kg (Calculated) : 54.7 Heparin Dosing Weight: 87 kg  Vital Signs: Temp: 97.8 F (36.6 C) (09/26 1600) Temp Source: Oral (09/26 1600) BP: 109/49 (09/26 1615) Pulse Rate: 93 (09/26 1615)  Labs: Recent Labs    03/02/20 1526 03/02/20 1728 03/03/20 0330 03/03/20 0739 03/03/20 1108 03/03/20 1108 03/03/20 1513 03/03/20 1516  HGB 9.4*   < > 9.3*   < > 10.2*   < > 9.1* 9.9*  HCT 30.0*   < > 30.1*   < > 30.0*  --  29.5* 29.0*  PLT 101*  --  104*  --   --   --  107*  --   HEPARINUNFRC 0.17*  --  0.19*  --   --   --  0.25*  --   CREATININE 0.91  --  0.87  --   --   --  0.95  --    < > = values in this interval not displayed.    Estimated Creatinine Clearance: 82.3 mL/min (by C-G formula based on SCr of 0.95 mg/dL).  Assessment: 54 yo female COVID+ on VV ECMO. Patient started on bivalirudin but was found to have multiple ICH on 8/13 head CT. Bivalirudin was stopped and pt was off anticoagulation at that time. Her ECMO circuit was changed 8/24. Pharmacy asked to start low-fixed rate heparin.   Heparin was started at low fixed rate for pump patency in setting of ICH. Concern with increased clotting off CRRT as well as circuit clots - s/p circuit exchange 9/17 so will titrate heparin to a low goal of 0.2-0.3  Heparin level came back therapeutic at 0.25, on 1300 units/hr. Hgb 9.9, plt 107. Still having bleeding during suctioning - stable from earlier but different from yesterday. No infusion issues.  Goal of Therapy:  HL 0.2-0.3 units/mL Monitor platelets by anticoagulation protocol: Yes   Plan:  Reduce IV heparin at 1250 units/hr  Monitor q12hr HL, CBC/plt Monitor for signs/symptoms of bleeding   Antonietta Jewel, PharmD,  BCCCP Clinical Pharmacist  Phone: [phone] 03/03/2020 4:36 PM  Please check AMION for all [place] phone numbers After 10:00 PM, call [place] [phone]

## 2020-03-03 NOTE — Progress Notes (Signed)
Daily Progress Note   Patient Name: Alisha Stephenson       Date: 03/03/2020 DOB: 1966/04/05  Age: 54 y.o. MRN#: [id number] Attending Physician: Candee Furbish, MD Primary Care Physician: Shelda Pal, DO Admit Date: 01/30/2020  Reason for Consultation/Follow-up:  To discuss complex medical decision making related to patient's goals of care  Discussed with Dr. Tamala Julian of CCM and ICU RN.     Subjective: Introduced myself to the patient and her husband at bedside in preparation for a family meeting tomorrow at 3:00.  Alisha Stephenson is smiling and interactive.  She asks when she will be able to get out of bed.  I asked her how she met Alisha Stephenson and she tells me "in the woods".  Alisha Stephenson laughs and says the same.   She denies any pain.  Alisha Stephenson requests some medication to calm her down - he's concerned she will try to get out of bed on her own.   Assessment: Prolonged hospitalization due to COVID 19.  Required VV ECMO.  Suffered ICH, trach'd required ECMO circuit change x 2, on CVVHD, G/J tube in place.  She has a significant sacral wound (cultures show serratia and pseudomonas) and is receiving hydrotherapy.  She has necrotic digits.   Patient Profile/HPI:   54 y.o.femalewith past medical history of DM type 1.5, asthma, diverticulitis, and NAFLD. She tested positive for Covid-19 on 12/31/19 and at that time was having symptoms of diarrhea, cough, and congestion. She presented to the [facility] ED on 01/06/20 with headache, MRI was negative and she was sent home. She presented again to the [facility] 8/2/2021with dyspnea.In the ED, she required high flow oxygen to maintain O2 saturation, chest x-ray showed infiltrates concerning for pneumonia. She was started on actemra,  steroids, and remdesivir; admitted to [facility].Her respiratory status rapidly declined requiring intubation. She developed severe ARDS secondary to Covid pneumonia, requiring prone positioning, paralytics, and VV ECMO. Has been cannulated since 8/9.   Palliative care was consulted to discuss goals of care in the setting of being on ECMO. Have offered ongoing support throughout hospitalization.  Length of Stay: 55   Vital Signs: BP (!) 97/54   Pulse 96   Temp (!) 97.5 F (36.4 C) (Oral)   Resp (!) 27   Ht 5' 4"  (  1.626 m)   Wt 112.6 kg   SpO2 100%   BMI 42.61 kg/m  SpO2: SpO2: 100 % O2 Device: O2 Device: Ventilator O2 Flow Rate: O2 Flow Rate (L/min): 30 L/min       Palliative Assessment/Data: 20%     Palliative Care Plan    Recommendations/Plan: Family meeting planned for 3:00 pm on Monday.   Will need to discuss next steps and try to set realistic expectations of what Alisha Stephenson's functionality will be post ECMO.  Code Status:  Limited code.  Willing to be intubated.  Prognosis:  Unable to determine.  She is at high risk of decompensation, rehospitalization or even death due to multi organ system failure and prolonged ECMO.  Discharge Planning: To Be Determined.  Possible LTAC  Care plan was discussed with Dr. Tamala Julian and husband  Thank you for allowing the Palliative Medicine Team to assist in the care of this patient.  Total time spent:  25 min.     Greater than 50%  of this time was spent counseling and coordinating care related to the above assessment and plan.  Florentina Jenny, PA-C Palliative Medicine  Please contact Palliative MedicineTeam phone at [phone] for questions and concerns between 7 am - 7 pm.   Please see AMION for individual provider pager numbers.

## 2020-03-03 NOTE — Progress Notes (Signed)
[place] for Heparin Indication: ECMO  Allergies  Allergen Reactions  . Sulfa Antibiotics Rash and Other (See Comments)    Patient Measurements: Height: 5\' 4"  (162.6 cm) Weight: 116.1 kg (255 lb 15.3 oz) IBW/kg (Calculated) : 54.7 Heparin Dosing Weight: 87 kg  Vital Signs: Temp: 97.9 F (36.6 C) (09/26 0400) Temp Source: Oral (09/26 0400) BP: 107/57 (09/26 0400) Pulse Rate: 89 (09/26 0400)  Labs: Recent Labs    03/02/20 0222 03/02/20 0223 03/02/20 1526 03/02/20 1728 03/02/20 1927 03/02/20 1927 03/03/20 0329 03/03/20 0330  HGB 9.1*   < > 9.4*   < > 9.9*   < > 10.2* 9.3*  HCT 29.0*   < > 30.0*   < > 29.0*  --  30.0* 30.1*  PLT 99*  --  101*  --   --   --   --  104*  HEPARINUNFRC 0.14*  --  0.17*  --   --   --   --  0.19*  CREATININE 1.02*  --  0.91  --   --   --   --  0.87   < > = values in this interval not displayed.    Estimated Creatinine Clearance: 91.5 mL/min (by C-G formula based on SCr of 0.87 mg/dL).  Assessment: 54 yo female COVID+ on VV ECMO. Patient started on bivalirudin but was found to have multiple ICH on 8/13 head CT. Bivalirudin was stopped and pt was off anticoagulation at that time. Her ECMO circuit was changed 8/24. Pharmacy asked to start low-fixed rate heparin.   Heparin was started at low fixed rate for pump patency in setting of ICH. Concern with increased clotting off CRRT as well as circuit clots - s/p circuit exchange 9/17 so will titrate heparin to a low goal of 0.2-0.3  Heparin level remains subtherapeutic but trending up to 0.19, on 1300 units/hr. Rate increased twice in last 24hr. Hgb, plt stable. No s/sx of bleeding or infusion issues per RN.  Goal of Therapy:  HL 0.2-0.3 units/mL Monitor platelets by anticoagulation protocol: Yes   Plan:  Continue IV heparin at 1300 units/hr  Monitor q12hr HL, CBC/plt Monitor for signs/symptoms of bleeding    Benetta Spar, PharmD, BCPS, BCCP Clinical  Pharmacist  Please check AMION for all [place] phone numbers After 10:00 PM, call [place] [phone]

## 2020-03-03 NOTE — Progress Notes (Signed)
Patient ID: Alisha Stephenson, female   DOB: 03-15-1966, 54 y.o.   MRN: [id number]    Advanced Heart Failure Rounding Note   Subjective:    - 8/2 COVID + test - 8/9 Cannulated for VV ECMO - 8/13 with several areas of intracranial hemorrhage. Bival stopped.  - 8/14 CT no change in [place]. Increased edema - 8/16 Extubated - 8/16 Head CT stable bleed - 8/22 CVVHD started - 8/24 reintubated - 8/24 circuit changed - 8/30 trach - 9/7  Switched to iHD - 02/15/20 Switched back to CVVHD - 9/10 GJ tube placed - 9/17 Circuit change  Remains awake on vent via trach. Communicative. Following commands. Working with PT. Had hydrotherapy again yesterday for large sacral wound.   Remains on CVVHD. Weight down 7 pounds.   ECMO  Flow 4.4 L RPM 3700 Sweep5.5  Labs: 7.32/50/72/93% 40% FiO2 TV 350-400cc Hgb9.3 PLT 104k LDH 248 Lactic acid2.3 -> 1.5 -> 1.7 -> 2.0 Heparin 0.19  Objective:   Weight Range:  Vital Signs:   Temp:  [97.4 F (36.3 C)-98.1 F (36.7 C)] 97.5 F (36.4 C) (09/26 0800) Pulse Rate:  [84-97] 93 (09/26 0900) Resp:  [24-31] 27 (09/26 0400) BP: (90-135)/(38-91) 126/59 (09/26 0900) SpO2:  [91 %-100 %] 98 % (09/26 0900) Arterial Line BP: (67-150)/(41-70) 137/56 (09/26 0900) FiO2 (%):  [40 %] 40 % (09/26 0755) Weight:  [112.6 kg] 112.6 kg (09/26 0500) Last BM Date: (P) 03/02/20  Weight change: Filed Weights   03/01/20 0500 03/02/20 0500 03/03/20 0500  Weight: 114 kg 116.1 kg 112.6 kg    Intake/Output:   Intake/Output Summary (Last 24 hours) at 03/03/2020 0944 Last data filed at 03/03/2020 0700 Gross per 24 hour  Intake 3056.51 ml  Output 3289 ml  Net -232.49 ml     Physical Exam: General:  Sitting up in bed awake following commands HEENT: normal Neck: RIJ ECMO. +  Trach  supple. Cor: PMI nondisplaced. Regular rate & rhythm. No rubs, gallops or murmurs. Lungs: clear decreased BS throughout Abdomen: obese soft, nontender, nondistended. + PEGGood bowel  sounds. Extremities: no cyanosis, clubbing, rash, tr edema Neuro: alert  Follows commands weak  Telemetry: Sinus 90-100 Personally reviewed    Labs: Basic Metabolic Panel: Recent Labs  Lab 02/28/20 0435 02/28/20 0446 02/29/20 0411 02/29/20 0423 03/01/20 0427 03/01/20 0803 03/01/20 1539 03/01/20 1946 03/02/20 0222 03/02/20 0223 03/02/20 1526 03/02/20 1728 03/02/20 1833 03/02/20 1927 03/03/20 0329 03/03/20 0330 03/03/20 0739  NA 136   < > 134*   < > 132*   < > 133*   < > 136   < > 133*   < > 136 136 136 134* 137  K 4.8   < > 4.5   < > 4.4   < > 4.4   < > 3.5   < > 3.3*   < > 3.4* 3.4* 3.4* 3.3* 3.4*  CL 101   < > 99   < > 96*  --  99  --  99  --  97*  --   --   --   --  98  --   CO2 25   < > 25   < > 24  --  23  --  24  --  23  --   --   --   --  23  --   GLUCOSE 221*   < > 174*   < > 179*  --  220*  --  207*  --  220*  --   --   --   --  222*  --   BUN 39*   < > 37*   < > 41*  --  42*  --  44*  --  43*  --   --   --   --  44*  --   CREATININE 1.06*   < > 1.03*   < > 1.04*  --  1.08*  --  1.02*  --  0.91  --   --   --   --  0.87  --   CALCIUM 9.0   < > 8.8*   < > 8.8*   < > 8.8*  --  9.1  --  9.2  --   --   --   --  9.4  --   MG 2.6*  --  2.8*  --  2.8*  --   --   --  2.7*  --   --   --   --   --   --  2.7*  --   PHOS 2.4*   < > 2.0*   < > 3.2  --  2.9  --  2.5  --  3.5  --   --   --   --  2.7  --    < > = values in this interval not displayed.    Liver Function Tests: Recent Labs  Lab 03/01/20 0427 03/01/20 1539 03/02/20 0222 03/02/20 1526 03/03/20 0330  ALBUMIN 3.1* 3.1* 3.3* 3.4* 3.3*   No results for input(s): LIPASE, AMYLASE in the last 168 hours. No results for input(s): AMMONIA in the last 168 hours.  CBC: Recent Labs  Lab 02/28/20 1753 02/28/20 1952 03/01/20 0427 03/01/20 0803 03/01/20 1539 03/01/20 1946 03/02/20 0222 03/02/20 0223 03/02/20 1526 03/02/20 1728 03/02/20 1833 03/02/20 1927 03/03/20 0329 03/03/20 0330 03/03/20 0739  WBC 12.0*    < > 8.1  --  10.3  --  7.3  --  7.6  --   --   --   --  7.2  --   NEUTROABS 9.5*  --   --   --   --   --   --   --   --   --   --   --   --   --   --   HGB 8.8*   < > 9.2*   < > 9.9*   < > 9.1*   < > 9.4*   < > 9.9* 9.9* 10.2* 9.3* 10.5*  HCT 28.9*   < > 29.8*   < > 32.1*   < > 29.0*   < > 30.0*   < > 29.0* 29.0* 30.0* 30.1* 31.0*  MCV 102.5*   < > 99.0  --  98.5  --  98.6  --  99.3  --   --   --   --  97.1  --   PLT 113*   < > 97*  --  104*  --  99*  --  101*  --   --   --   --  104*  --    < > = values in this interval not displayed.    Cardiac Enzymes: No results for input(s): CKTOTAL, CKMB, CKMBINDEX, TROPONINI in the last 168 hours.  BNP: BNP (last 3 results) No results for input(s): BNP in the last 8760 hours.  ProBNP (last 3 results) No results for input(s): PROBNP in the last 8760 hours.  Other results:  Imaging: DG CHEST PORT 1 VIEW  Result Date: 03/02/2020 CLINICAL DATA:  Patient with cardio respiratory failure. EXAM: PORTABLE CHEST 1 VIEW COMPARISON:  Chest radiograph March 01, 2020. FINDINGS: Tracheostomy tube terminates at the thoracic inlet. Left IJ central venous catheter tip projects over the superior vena cava. ECMO catheter projects at the inferior aspect of the vena cava. Right upper extremity PICC line visualized to the superior vena cava. Monitoring leads overlie the patient. Obscured cardiac and mediastinal contours. Relatively unchanged diffuse bilateral airspace opacities. Possible small bilateral pleural effusions. No definite pneumothorax. IMPRESSION: Similar-appearing diffuse bilateral airspace opacities. Stable support apparatus. Electronically Signed   By: Lovey Newcomer M.D.   On: 03/02/2020 09:55     Medications:     Scheduled Medications:  amiodarone  200 mg Per Tube Daily   vitamin C  500 mg Per Tube Daily   chlorhexidine gluconate (MEDLINE KIT)  15 mL Mouth Rinse BID   Chlorhexidine Gluconate Cloth  6 each Topical Q0600   clonazePAM  1  mg Per Tube Q6H   collagenase   Topical Daily   docusate  100 mg Per Tube BID   feeding supplement (PROSource TF)  45 mL Per Tube QID   fentaNYL  1 patch Transdermal Q72H   Gerhardt's butt cream   Topical BID   insulin aspart  0-15 Units Subcutaneous Q4H   insulin aspart  15 Units Subcutaneous Q4H   insulin detemir  60 Units Subcutaneous BID   linagliptin  5 mg Per Tube Daily   mouth rinse  15 mL Mouth Rinse 10 times per day   melatonin  9 mg Per Tube QHS   metoCLOPramide (REGLAN) injection  5 mg Intravenous Q8H   midodrine  10 mg Per Tube TID   multivitamin  1 tablet Per Tube QHS   oxyCODONE  10 mg Per Tube Q4H   pantoprazole (PROTONIX) IV  40 mg Intravenous BID   phenylephrine       pneumococcal 23 valent vaccine  0.5 mL Intramuscular Tomorrow-1000   polyethylene glycol  17 g Per Tube Daily   QUEtiapine  100 mg Per Tube QHS   sennosides  5 mL Per Tube BID   sodium bicarbonate       zinc sulfate  220 mg Per Tube Daily    Infusions:   prismasol BGK 4/2.5 400 mL/hr at 03/02/20 2300    prismasol BGK 4/2.5 200 mL/hr at 03/02/20 1904   sodium chloride 250 mL (03/01/20 0844)   sodium chloride 10 mL/hr at 03/03/20 0700   sodium chloride Stopped (02/24/20 2312)   sodium chloride     albumin human Stopped (03/02/20 1100)   albumin human     ceFEPime (MAXIPIME) IV Stopped (03/03/20 0534)   feeding supplement (PIVOT 1.5 CAL) 1,000 mL (03/02/20 2100)   heparin 10,000 units/ 20 mL infusion syringe 500 Units/hr (03/03/20 0400)   heparin 1,300 Units/hr (03/03/20 0700)   prismasol BGK 2/2.5 dialysis solution 1,500 mL/hr at 03/03/20 0512   vancomycin Stopped (03/02/20 1043)    PRN Medications: sodium chloride, sodium chloride, sodium chloride, sodium chloride, acetaminophen (TYLENOL) oral liquid 160 mg/5 mL, albumin human, albuterol, alteplase, alum & mag hydroxide-simeth, dextrose, fentaNYL (SUBLIMAZE) injection, guaiFENesin-dextromethorphan, heparin,  heparin, heparin, hydrALAZINE, HYDROmorphone (DILAUDID) injection, hydrOXYzine, lidocaine (PF), lidocaine, lidocaine-prilocaine, ondansetron **OR** ondansetron (ZOFRAN) IV, pentafluoroprop-tetrafluoroeth, silver nitrate applicators, Zinc Oxide   Assessment/Plan:   1. Acute hypoxic/hypercapneic respiratory failure in setting of severe COVID PNA/ARDS -> VV ECMO - admit  8/2 - intubation 8/3 - has received actmera (compelted 8/2), remdesivir (completed 8/6) and steroids - Cannulated for VV ECMO on 8/9 - Extubated 8/16. Reintubated 8/24 - s/p trach 8/30. - Circuit changed 8/24 due to concern for hemolysis and worsening oxygenation. Repeat circuit change 9/17 - CVVHD started 8/22 for volume removal and uremia. Remains anuric. CXR worse on 9/9 so switched back to CVVHD. Keep even to -50/hr - Off IV sedation. Continue melatonin and seroquel for sleep at night. - Stable off pressors. On midodrine 10 tid. Has wide pulse pressure but no AI on echo.  - Lung function returning very slowly. I suspect major issue remains respiratory muscle weakness and poor lung compliance but still with diffuse infiltrates suggestive of fibrosis. Continue to wean sweep. We tried to optimize vent yesterday to wean ECMO further but it didn't work. Will get chest CT to further evaluate. On abx for pseudomonas in sputum and sacral wound - Continue heparin with level 0.2-0.3 Discussed dosing with PharmD personally.  2. Enterococcus sepsis - Finished high-dose zosyn on 9/7 per ID - Eraxis added on 8/26 with yeast in BAL 8/24 (ended 9/2)  - F/u cx 9/7 No growth  3. Intracranial hemorrhage - ? Septic emboli - repeat head CT on 8/14 with stable bleeds but increased edema - neurology has seen. Suspect significant long-term injury sustained. Will follow commands. Appears to have dense LUE weakness and possibly LLE - repeat head CT stable 8/16 - tolerating heparin. No change. Heparin level 0.19  4. Thrombocytopenia - PLTs  stable around 90-100k - Continue to follow.   5. Morbid obesity - Body mass index is Body mass index is 42.61 kg/m.  6. Poorly controlled DM2 - HgBA1c 10.7 - IV insulin converted to SQ insulin on 9/15  7. PAF - intermittent episodes. Last 8/26 - Now on po. Quiescent   8. AKI/azotemia - CVVHD started on 8/22 - Down 70 pounds.  - transitioned to [facility] on 9/7 - switched back to CVVHD for better volume removal on 9/9. - new catheter placed 9/9 - will eventually need tunneled access - Volume status ok. Keep CVVHD even to -50 - D/w Renal at bedside  9. Sacral decub  - large unstageable. Seen by [facility]. Not candidate for debridement at this point.  - for hydrotherapy day #5 today. Cx + pseudomonas - abx as above  10. Necrotic fingers on R - VVS following - suspect will auto amputate  11. Anemia - Transfuse hgb < 8   CRITICAL CARE Performed by: Glori Bickers  Total critical care time: 35 minutes  Critical care time was exclusive of separately billable procedures and treating other patients.  Critical care was necessary to treat or prevent imminent or life-threatening deterioration.  Critical care was time spent personally by me (independent of midlevel providers or residents) on the following activities: development of treatment plan with patient and/or surrogate as well as nursing, discussions with consultants, evaluation of patient's response to treatment, examination of patient, obtaining history from patient or surrogate, ordering and performing treatments and interventions, ordering and review of laboratory studies, ordering and review of radiographic studies, pulse oximetry and re-evaluation of patient's condition.   Length of Stay: [place]  MD 03/03/2020, 9:44 AM  Advanced Heart Failure Team Pager [phone] (M-F; [place])  Please contact [facility] for night-coverage after hours (4p -7a ) and weekends on [email or web address]

## 2020-03-03 NOTE — Progress Notes (Signed)
NAME:  Alisha Stephenson, MRN:  [id number], DOB:  June 19, 1965, LOS: 39 ADMISSION DATE:  01/17/2020, CONSULTATION DATE:  8/3 REFERRING MD:  Alfredia Ferguson, CHIEF COMPLAINT:  Dyspnea   Brief History   54 y/o female admitted on 8/2 with severe acute respiratory failure with hypoxemia due to COVID 19 pneumonia.  She developed symptoms 1 week prior to admission.  Past Medical History  DM2 Diverticulitis Gallstones Ovarian cyst NAFLD Asthma  Significant Hospital Events   8/2 admit 8/3 ICU transfer, intubated 8/4 prone, paralyze 8/9 significant desaturations today 8/9 VV ECMO cannulation 8/13 ICH 8/30 trach  Consults:  PCCM ECMO team General surgery 02/27/2020 for sacral wound care possible debridement  Procedures:  8/3 ETT > 8/30 8/30 8-0 shiley trach 8/3 PICC >  8/9 LIJ MML 8/9 RIJ [place] 60F  9/8 trialysis 9/10 PEG-J (IR)  Significant Diagnostic Tests:  7/31 CT head > NAICP 7/31 MRI/MRA brain > no acute changes, possibly small aneurysm ACOM 8/13 CT head> multiple areas of ICH  Micro Data:  Tracheal aspirate and wound: serratia and pseudomonas  Antimicrobials:  See fever tab for past abx Current: none  Interim history/subjective:  No events. Has spikes of pain on back with turning.  Objective   Blood pressure 120/60, pulse 96, temperature 97.9 F (36.6 C), temperature source Oral, resp. rate (!) 27, height 5\' 4"  (1.626 m), weight 112.6 kg, SpO2 99 %.    Vent Mode: PCV FiO2 (%):  [40 %] 40 % Set Rate:  [24 bmp] 24 bmp PEEP:  [10 cmH20] 10 cmH20 Plateau Pressure:  [17 cmH20-37 cmH20] 36 cmH20   Intake/Output Summary (Last 24 hours) at 03/03/2020 0716 Last data filed at 03/03/2020 0700 Gross per 24 hour  Intake 3251.59 ml  Output 3534 ml  Net -282.41 ml   Filed Weights   03/01/20 0500 03/02/20 0500 03/03/20 0500  Weight: 114 kg 116.1 kg 112.6 kg    Examination:  GEN: Obese female on mechanical life support, VV ECMO in place, right IJ cannulation HEENT: NCAT,  tracking appropriately, following commands Neck: Tracheostomy tube in place, CDI, less bloody secretions CV: Regular rate rhythm, S1-S2 PULM: Bilateral mechanically ventilated breath sounds GI: Soft nontender nondistended obese pannus EXT: Right upper extremity distal gangrene of the thumb and forefinger, stable NEURO: Alert oriented following commands PSYCH: Calm, interactive  Pressure Injury 02/14/20 Anus Medial Deep Tissue Pressure Injury - Purple or maroon localized area of discolored intact skin or blood-filled blister due to damage of underlying soft tissue from pressure and/or shear. (Active)  02/14/20 1300  Location: Anus  Location Orientation: Medial  Staging: Deep Tissue Pressure Injury - Purple or maroon localized area of discolored intact skin or blood-filled blister due to damage of underlying soft tissue from pressure and/or shear.  Wound Description (Comments):   Present on Admission: No     Pressure Injury Back Left;Mid Stage 2 -  Partial thickness loss of dermis presenting as a shallow open injury with a red, pink wound bed without slough. in skin fold on back (Active)     Location: Back  Location Orientation: Left;Mid  Staging: Stage 2 -  Partial thickness loss of dermis presenting as a shallow open injury with a red, pink wound bed without slough.  Wound Description (Comments): in skin fold on back  Present on Admission: No     Pressure Injury 02/21/20 Sacrum Posterior Unstageable - Full thickness tissue loss in which the base of the injury is covered by slough (yellow, tan, gray, green  or brown) and/or eschar (tan, brown or black) in the wound bed. Unstageable Sacral Wound (Active)  02/21/20 1206  Location: Sacrum  Location Orientation: Posterior  Staging: Unstageable - Full thickness tissue loss in which the base of the injury is covered by slough (yellow, tan, gray, green or brown) and/or eschar (tan, brown or black) in the wound bed.  Wound Description (Comments):  Unstageable Sacral Wound  Present on Admission: No   Labs reviewed with ECMO team at bedside on multidisciplinary rounds  Chest x-ray: 03/02/2020: Bilateral persistent infiltrates, stable lines and tubes The patient's images have been independently reviewed by me.    ABG respiratory insufficiency Chemistries fine Lactate hovering between 1.5 and 2.1 Hgb stable with transfusions WBC normal Plts 113>>90>>97>>99>>101 LDH 284>>268>>281>>262>>248 Fibrinogen 675>>658>>734>>718>>701  Resolved Hospital Problem list   MSSA pneumonia Fungal pneumonia- Candida dublinensis Enterococcal bacteremia-recent blood cultures cleared Concern for possible embolic phenomenon- fingers and possibly CVAs were embolic  Assessment & Plan:   Acute Hypoxemic and AoC hypercapneic respiratory failure;  - likely underly OHS.   Requiring VV ECMO support and mechanical ventilation.  ARDS due to COVID 19 pneumonia, slowly resolving, now likely fibrotic stage disease  Tracheostomy in place Pseudomonas and serratia HCAP -Continue VV ECMO support -Continue to titrate down sweep gas as tolerated -Discussed circuit with ECMO specialist at bedside -Continue head of bed elevated, ventilator associated pneumonia prophylaxis -Remains off of continuous sedation -Routine trach care. -Today: keep try ing to wean sweep  ICH, multifocal. L-sided weakness.  Critical illness myopathy--improving slowly. -Continue SLP, PT and OT  Acute metabolic encephalopathy, improving Sundowning in the evenings -Continue delirium precautions -Encourage appropriate sleep-wake cycles. -Continue Seroquel nightly  Acute renal failure, severe uremia.  Anuric. -Continue CVVHD per nephrology  Hypotension. Acute cor pulmonale, improved -Remains off vasopressors at this time -Continue to observe  Hyperglycemia  -Continue insulin basal bolus to maintain CBG goal of 140-180 -Increase levemir 03/02/20  Pressure Injury, Skin, sacrum-  wound growing pseudomonas, serratia  -No plans for debridement at present - Continue local wound care, hydrotherapy and turning as able - WOC following - Increase frequency of oxycodone, start fentanyl patch to limit PRNs  GOC NA COVID ELSO Data below.  We are close to day 50.  We will get a CT of the chest to see degree of fibrotic change and discuss with family tomorrow what pushing forward looks like.  Just want everyone (staff, patient, family) to go into this with realistic expectations.   Best practice:  Diet: tube feeding Pain/Anxiety/Delirium protocol (if indicated): as above VAP protocol (if indicated): yes DVT prophylaxis: SCDs , heparin GI prophylaxis: pantoprazole Glucose control: ssi/basal Mobility: bed rest Code Status: limited Family Communication: husband at bedside and updated daily Disposition: ICU  This patient is critically ill with multiple organ system failure; which, requires frequent high complexity decision making, assessment, support, evaluation, and titration of therapies. This was completed through the application of advanced monitoring technologies and extensive interpretation of multiple databases. During this encounter critical care time was devoted to patient care services described in this note for 42 minutes.  Candee Furbish, MD [facility] 03/03/2020 7:16 AM

## 2020-03-03 NOTE — Progress Notes (Addendum)
ECMO patient transported to CT and back to 1T95 without complications by ECMO Specialist, RN, RT, Pharmacist, and Transport.   All emergency equipment gathered prior to transport including full O2 tanks, blood, and medications.  During transport, sweep placed on 6L while O2 was connected to the O2 tank. Upon arrival back to patient's room, O2 was connected to the wall outlet and the sweep turned back to 5.5L.  Power plugged back into a red power outlet. ECL[facility] Transport checklist completed.

## 2020-03-03 NOTE — Progress Notes (Addendum)
Palliative Medicine Inpatient Follow Up Note   Reason for Consultation:Establishing goals of care  HPI/Patient Profile:54 y.o.femalewith past medical history of DM type 2, asthma, diverticulitis, and NAFLD. She tested positive for Covid-19 on 12/31/19 and at that time was having symptoms of diarrhea, cough, and congestion. She presented to the [facility] ED on 01/06/20 with headache, MRI was negative and she was sent home. She presented again to the [facility] 8/2/2021with dyspnea.In the ED, she required high flow oxygen to maintain O2 saturation, chest x-ray showed infiltrates concerning for pneumonia. She was started on actemra, steroids, and remdesivir; admitted to [facility].Her respiratory status rapidly declined requiring intubation. She developed severe ARDS secondary to Covid pneumonia, requiring prone positioning, paralytics, and VV ECMO. Has been cannulated since 8/9.   Palliative care was consulted to discuss goals of care in the setting of being on ECMO. Have offered ongoing support throughout hospitalization.   Today's Discussion (03/02/2020): Chart reviewed - patient with increased amounts of chugging overnight. Per discussion with ECMO RN patient last evening spent a good amount of time with bedside RN stating that she no longer wanted to continue with the present treatments and wished to die. Nursing also shares that when patients husband comes in she has a tendency to then state she wants to live. Per RN patients daughters have been interested in having a meeting to better understand how things are evolving as a whole.   I met with Fredrick and [name] (spouse) at bedside. Rayen had just settled down per [facility] and appeared to be resting. I asked [name] how he is feeling about her present health state. He expresses that Elysia tends to get immensely anxious when he leaves the bedside and he does not feel that she wants to die. He thinks that it is all related  to disorientation during the night and when he is not present. We reviewed delirium and how this could certainly play a role in such situations though I emphasized that it is important we allow a safe space for Amana to share how she is feeling. I offered that in some cases it's harder to express feelings when you have someone present who has tremendous hope for recovery. Vernon then deflected and stated that these feelings are all related to him not being present and her becoming more altered during the evening hours. He goes on to say that healthcare providers only see patients at their worst and don't know patients beyond a sick person in a bed. I asked him to tell me about her. He goes on to tell me the type of woman she is and the special relationship they have. He states that they have been "at one another's side for the last twenty two years".  I utilized reflective listening during our time. I offered empathy as this is most clearly a very difficult situation.   [name] shares that he is concerned next week he will need to go to work. He expresses feeling overwhelmed by this and that his mother is now living with him and has cataract surgery coming up. He shares that he is trying to solidify a tiny home for her to live in behind his residence.   I asked [name] what from his perspective we could offer to support him during this difficult time. I shared that the PMT would be happy to arrange a meeting. He said that his daughters would appreciate that. We ended our conversation by agreeing that a  provider would check in tomorrow to further identify a meeting time for Monday as this appears to be the best day being that both daughters are off work.   Discussed the importance of continued conversation with family and their  medical providers regarding overall plan of care and treatment options, ensuring decisions are within the context of the patients values and GOCs.  Questions and concerns addressed    SUMMARY OF RECOMMENDATIONS - Continue current interventions and support  - PMT will try to arrange for a family meeting on 9/27 with patients two daughters and husband- Objectives will include provided a formal medical update which we would rely on our intensivist team to help with --> family would like to understand more about "lung recovery".  It's important too that we discuss the bigger picture, I worry that perhaps patients family does not realize the ongoing debility and total care needs that Kylei will have even if successfully decannulated.   Time Spent: 35 Greater than 50% of the time was spent in counseling and coordination of care ______________________________________________________________________________________ [facility] Team Team Cell Phone: [phone] Please utilize secure chat with additional questions, if there is no response within 30 minutes please call the above phone number  Palliative Medicine Team providers are available by phone from 7am to 7pm daily and can be reached through the team cell phone.  Should this patient require assistance outside of these hours, please call the patient's attending physician.

## 2020-03-03 NOTE — Progress Notes (Signed)
Transported to CT and back not noted respiratory issues at this time.

## 2020-03-03 NOTE — Procedures (Signed)
Extracorporeal support note  ECLS support day: Cannulated 01/23/2020, Day 48 Indication: COVID 19 ARDS   Configuration: ECMO Mode: VV  Drainage cannula: RIJ [place]  Return cannula: Same   Pump speed: 3700 rpm  Pump flow: Flow (LPM): 4.46  Pump used: ECMO Device: Centrimag  Oxygenator: Quadrox O2 blender: 100% Sweep gas: 5.5L  Circuit check: tiny clots in oxygenator  Case discussed with ECMO specialist at bedside Anticoagulant: Heparin Anticoagulation targets: Anticoagulation Goal: Hep 0.2-0.3  Changes in support: Continue sweep weans  Anticipated goals/duration of support: Bridge to recovery  All issues highlighted discussed on multidisciplinary team rounding.  Candee Furbish, MD [facility] 03/03/2020 7:14 AM

## 2020-03-03 NOTE — Progress Notes (Signed)
Patient ID: Alisha Stephenson, female   DOB: [birth year], 54 y.o.   MRN: [id number] S: For CT scan of lungs today to evaluate fibrotic changes of lungs. O:BP (!) 126/59   Pulse 93   Temp (!) 97.5 F (36.4 C) (Oral)   Resp (!) 27   Ht _0  (1.626 m)   Wt 112.6 kg   SpO2 98%   BMI 42.61 kg/m   Intake/Output Summary (Last 24 hours) at 03/03/2020 1035 Last data filed at 03/03/2020 0700 Gross per 24 hour  Intake 2598.81 ml  Output 3237 ml  Net -638.19 ml   Intake/Output: I/O last 3 completed shifts: In: 5551.5 [I.V.:700.7; Other:1430; NG/GT:2400; IV Piggyback:1020.7] Out: 1007 [Drains:550; HQRFX:5883]  Intake/Output this shift:  No intake/output data recorded. Weight change: -3.5 kg Gen: on vent via trach CVS: RRR Resp: decreased BS Abd: +BS, soft, PEG in place Ext: trace edema  Recent Labs  Lab 02/29/20 0411 02/29/20 0423 02/29/20 1635 02/29/20 1645 03/01/20 0427 03/01/20 0803 03/01/20 1539 03/01/20 1946 03/02/20 0222 03/02/20 0223 03/02/20 1526 03/02/20 1728 03/02/20 1833 03/02/20 1927 03/03/20 0329 03/03/20 0330 03/03/20 0739  NA 134*   < > 132*   < > 132*   < > 133*   < > 136   < > 133* 134* 136 136 136 134* 137  K 4.5   < > 5.0   < > 4.4   < > 4.4   < > 3.5   < > 3.3* 3.5 3.4* 3.4* 3.4* 3.3* 3.4*  CL 99  --  98  --  96*  --  99  --  99  --  97*  --   --   --   --  98  --   CO2 25  --  24  --  24  --  23  --  24  --  23  --   --   --   --  23  --   GLUCOSE 174*  --  239*  --  179*  --  220*  --  207*  --  220*  --   --   --   --  222*  --   BUN 37*  --  38*  --  41*  --  42*  --  44*  --  43*  --   --   --   --  44*  --   CREATININE 1.03*  --  1.03*  --  1.04*  --  1.08*  --  1.02*  --  0.91  --   --   --   --  0.87  --   ALBUMIN 3.1*  --  3.1*  --  3.1*  --  3.1*  --  3.3*  --  3.4*  --   --   --   --  3.3*  --   CALCIUM 8.8*  --  8.8*  --  8.8*  --  8.8*  --  9.1  --  9.2  --   --   --   --  9.4  --   PHOS 2.0*  --  3.2  --  3.2  --  2.9  --  2.5  --  3.5  --    --   --   --  2.7  --    < > = values in this interval not displayed.   Liver Function Tests: Recent Labs  Lab 03/02/20 0222 03/02/20 1526 03/03/20 0330  ALBUMIN 3.3* 3.4* 3.3*   No results for input(s): LIPASE, AMYLASE in the last 168 hours. No results for input(s): AMMONIA in the last 168 hours. CBC: Recent Labs  Lab 02/28/20 1753 02/28/20 1952 03/01/20 0427 03/01/20 0803 03/01/20 1539 03/01/20 1946 03/02/20 0222 03/02/20 0223 03/02/20 1526 03/02/20 1728 03/03/20 0329 03/03/20 0330 03/03/20 0739  WBC 12.0*   < > 8.1   < > 10.3  --  7.3  --  7.6  --   --  7.2  --   NEUTROABS 9.5*  --   --   --   --   --   --   --   --   --   --   --   --   HGB 8.8*   < > 9.2*   < > 9.9*   < > 9.1*   < > 9.4*   < > 10.2* 9.3* 10.5*  HCT 28.9*   < > 29.8*   < > 32.1*   < > 29.0*   < > 30.0*   < > 30.0* 30.1* 31.0*  MCV 102.5*   < > 99.0  --  98.5  --  98.6  --  99.3  --   --  97.1  --   PLT 113*   < > 97*   < > 104*  --  99*  --  101*  --   --  104*  --    < > = values in this interval not displayed.   Cardiac Enzymes: No results for input(s): CKTOTAL, CKMB, CKMBINDEX, TROPONINI in the last 168 hours. CBG: Recent Labs  Lab 03/02/20 1513 03/02/20 1924 03/03/20 0001 03/03/20 0326 03/03/20 0736  GLUCAP 179* 157* 214* 207* 174*    Iron Studies: No results for input(s): IRON, TIBC, TRANSFERRIN, FERRITIN in the last 72 hours. Studies/Results: CT CHEST WO CONTRAST  Result Date: 03/03/2020 CLINICAL DATA:  ARDS, known COVID-19 EXAM: CT CHEST WITHOUT CONTRAST TECHNIQUE: Multidetector CT imaging of the chest was performed following the standard protocol without IV contrast. COMPARISON:  Plain film from earlier in the same day as well as prior CT from 01/19/2020 FINDINGS: Cardiovascular: Thoracic aorta demonstrates no aneurysmal dilatation. No significant cardiac enlargement is noted. ECMO cannula is noted extending from the right jugular vein inferiorly to the IVC. Right-sided PICC line is  identified extending into the superior vena cava. Left jugular temporary dialysis catheter is noted. These are all stable from the recent chest x-ray. Mediastinum/Nodes: Thoracic inlet is within normal limits. Tracheostomy tube is noted. No sizable hilar or mediastinal adenopathy is noted. The esophagus is within normal limits. Lungs/Pleura: Small right-sided pleural effusion is noted. Diffuse airspace opacities are again identified similar to that seen on recent chest x-ray but slightly improved when compared with the prior CT examination from 1 month ago. These changes are consistent with sequelae from prior COVID-19 infection. Small right pneumothorax is noted anteriorly. Upper Abdomen: Visualized upper abdomen shows no acute abnormality. Musculoskeletal: Degenerative changes of the thoracic spine are noted. IMPRESSION: Persistent bilateral airspace opacities similar to that seen on recent chest x-ray but significantly improved when compared with the prior CT from 01/19/2020. Small right-sided pleural effusion is noted. Tiny right anterior pneumothorax likely chronic in nature. Tubes and lines as described above. Electronically Signed   By: Inez Catalina M.D.   On: 03/03/2020 10:32   DG CHEST PORT 1 VIEW  Result Date: 03/02/2020 CLINICAL DATA:  Patient with cardio respiratory failure. EXAM: PORTABLE CHEST  1 VIEW COMPARISON:  Chest radiograph March 01, 2020. FINDINGS: Tracheostomy tube terminates at the thoracic inlet. Left IJ central venous catheter tip projects over the superior vena cava. ECMO catheter projects at the inferior aspect of the vena cava. Right upper extremity PICC line visualized to the superior vena cava. Monitoring leads overlie the patient. Obscured cardiac and mediastinal contours. Relatively unchanged diffuse bilateral airspace opacities. Possible small bilateral pleural effusions. No definite pneumothorax. IMPRESSION: Similar-appearing diffuse bilateral airspace opacities. Stable  support apparatus. Electronically Signed   By: Lovey Newcomer M.D.   On: 03/02/2020 09:55   . amiodarone  200 mg Per Tube Daily  . vitamin C  500 mg Per Tube Daily  . chlorhexidine gluconate (MEDLINE KIT)  15 mL Mouth Rinse BID  . Chlorhexidine Gluconate Cloth  6 each Topical Q0600  . clonazePAM  1 mg Per Tube Q6H  . collagenase   Topical Daily  . docusate  100 mg Per Tube BID  . feeding supplement (PROSource TF)  45 mL Per Tube QID  . fentaNYL  1 patch Transdermal Q72H  . Gerhardt's butt cream   Topical BID  . insulin aspart  0-15 Units Subcutaneous Q4H  . insulin aspart  15 Units Subcutaneous Q4H  . insulin detemir  60 Units Subcutaneous BID  . linagliptin  5 mg Per Tube Daily  . mouth rinse  15 mL Mouth Rinse 10 times per day  . melatonin  9 mg Per Tube QHS  . metoCLOPramide (REGLAN) injection  5 mg Intravenous Q8H  . midodrine  10 mg Per Tube TID  . multivitamin  1 tablet Per Tube QHS  . oxyCODONE  10 mg Per Tube Q4H  . pantoprazole (PROTONIX) IV  40 mg Intravenous BID  . phenylephrine      . pneumococcal 23 valent vaccine  0.5 mL Intramuscular Tomorrow-1000  . polyethylene glycol  17 g Per Tube Daily  . QUEtiapine  100 mg Per Tube QHS  . sennosides  5 mL Per Tube BID  . sodium bicarbonate      . zinc sulfate  220 mg Per Tube Daily    BMET    Component Value Date/Time   NA 137 03/03/2020 0739   K 3.4 (L) 03/03/2020 0739   CL 98 03/03/2020 0330   CO2 23 03/03/2020 0330   GLUCOSE 222 (H) 03/03/2020 0330   BUN 44 (H) 03/03/2020 0330   CREATININE 0.87 03/03/2020 0330   CALCIUM 9.4 03/03/2020 0330   GFRNONAA >60 03/03/2020 0330   GFRAA >60 03/03/2020 0330   CBC    Component Value Date/Time   WBC 7.2 03/03/2020 0330   RBC 3.10 (L) 03/03/2020 0330   HGB 10.5 (L) 03/03/2020 0739   HCT 31.0 (L) 03/03/2020 0739   PLT 104 (L) 03/03/2020 0330   MCV 97.1 03/03/2020 0330   MCH 30.0 03/03/2020 0330   MCHC 30.9 03/03/2020 0330   RDW 20.0 (H) 03/03/2020 0330   LYMPHSABS 1.0  02/28/2020 1753   MONOABS 0.8 02/28/2020 1753   EOSABS 0.4 02/28/2020 1753   BASOSABS 0.1 02/28/2020 1753   Assessment/Plan:  1. Anuric AKI in setting of ischemic ATN related to septic shock from COVD-19 PNA requiring VV ECMO. Started on CRRT 8/22-02/12/20 and transitioned to IHD on 02/13/20 but back to CRRT on 02/15/20 due to volume overload and large IVF intake.  1. Tolerating CRRT and will keep even today 2. No changes to CRRT today. 2. Vascular access- L IJ trialysis catheter placed 02/15/20.No  issues for now. 3. Hypophosphatemia- due to CRRT-s/p replacement phos 03/02/20.  Continue to follow and redose prn.  4. Shock due to COVID-19 infection diagnosed 01/10/2020 as well as enterococcal bacteremia- completed zosyn 02/13/20, negative TTE. Off of pressors 5. Acute hypoxic/hypercapneic respiratory failure in setting of severe covid PNA/ARDS- now on VV ECMO per PCCM/CHF. Lung function slowly returning. UF as bp tolerates. 1. CT scan today improved from 01/19/20 6. ICH- stable by CT scan 8/14. Likely will have long-term deficits. 7. Thrombocytopenia- stable 8. Morbid obesity 9. DM type 2 10. P. Atrial fibrillation  Donetta Potts, MD [facility] [phone]

## 2020-03-04 ENCOUNTER — Inpatient Hospital Stay (HOSPITAL_COMMUNITY): Payer: PRIVATE HEALTH INSURANCE

## 2020-03-04 LAB — POCT I-STAT 7, (LYTES, BLD GAS, ICA,H+H)
Acid-Base Excess: 0 mmol/L (ref 0.0–2.0)
Acid-Base Excess: 0 mmol/L (ref 0.0–2.0)
Acid-base deficit: 1 mmol/L (ref 0.0–2.0)
Bicarbonate: 26 mmol/L (ref 20.0–28.0)
Bicarbonate: 25.2 mmol/L (ref 20.0–28.0)
Bicarbonate: 24.9 mmol/L (ref 20.0–28.0)
Calcium, Ion: 1.26 mmol/L (ref 1.15–1.40)
Calcium, Ion: 1.23 mmol/L (ref 1.15–1.40)
Calcium, Ion: 1.25 mmol/L (ref 1.15–1.40)
HCT: 27 % — ABNORMAL LOW (ref 36.0–46.0)
HCT: 28 % — ABNORMAL LOW (ref 36.0–46.0)
HCT: 28 % — ABNORMAL LOW (ref 36.0–46.0)
Hemoglobin: 9.2 g/dL — ABNORMAL LOW (ref 12.0–15.0)
Hemoglobin: 9.5 g/dL — ABNORMAL LOW (ref 12.0–15.0)
Hemoglobin: 9.5 g/dL — ABNORMAL LOW (ref 12.0–15.0)
O2 Saturation: 92 %
O2 Saturation: 95 %
O2 Saturation: 95 %
Patient temperature: 98.4
Patient temperature: 36.5
Patient temperature: 96.6
Potassium: 3.3 mmol/L — ABNORMAL LOW (ref 3.5–5.1)
Potassium: 3.8 mmol/L (ref 3.5–5.1)
Potassium: 3.6 mmol/L (ref 3.5–5.1)
Sodium: 135 mmol/L (ref 135–145)
Sodium: 133 mmol/L — ABNORMAL LOW (ref 135–145)
Sodium: 135 mmol/L (ref 135–145)
TCO2: 27 mmol/L (ref 22–32)
TCO2: 26 mmol/L (ref 22–32)
TCO2: 26 mmol/L (ref 22–32)
pCO2 arterial: 45.1 mmHg (ref 32.0–48.0)
pCO2 arterial: 42 mmHg (ref 32.0–48.0)
pCO2 arterial: 41.5 mmHg (ref 32.0–48.0)
pH, Arterial: 7.369 (ref 7.350–7.450)
pH, Arterial: 7.384 (ref 7.350–7.450)
pH, Arterial: 7.381 (ref 7.350–7.450)
pO2, Arterial: 67 mmHg — ABNORMAL LOW (ref 83.0–108.0)
pO2, Arterial: 74 mmHg — ABNORMAL LOW (ref 83.0–108.0)
pO2, Arterial: 75 mmHg — ABNORMAL LOW (ref 83.0–108.0)

## 2020-03-04 LAB — RENAL FUNCTION PANEL
Albumin: 3.3 g/dL — ABNORMAL LOW (ref 3.5–5.0)
Anion gap: 12 (ref 5–15)
BUN: 42 mg/dL — ABNORMAL HIGH (ref 6–20)
CO2: 22 mmol/L (ref 22–32)
Calcium: 9.3 mg/dL (ref 8.9–10.3)
Chloride: 99 mmol/L (ref 98–111)
Creatinine, Ser: 0.91 mg/dL (ref 0.44–1.00)
GFR calc Af Amer: >60 mL/min (ref >60)
GFR calc non Af Amer: >60 mL/min (ref >60)
Glucose, Bld: 227 mg/dL — ABNORMAL HIGH (ref 70–99)
Phosphorus: 2.3 mg/dL — ABNORMAL LOW (ref 2.5–4.6)
Potassium: 3.3 mmol/L — ABNORMAL LOW (ref 3.5–5.1)
Sodium: 133 mmol/L — ABNORMAL LOW (ref 135–145)

## 2020-03-04 LAB — TYPE AND SCREEN
ABO/RH(D): O POS
Antibody Screen: NEGATIVE
Unit division: 0
Unit division: 0
Unit division: 0
Unit division: 0
Unit division: 0
Unit division: 0
Unit division: 0
Unit division: 0

## 2020-03-04 LAB — AEROBIC CULTURE W GRAM STAIN (SUPERFICIAL SPECIMEN): Gram Stain: NONE SEEN

## 2020-03-04 LAB — CBC
HCT: 28.2 % — ABNORMAL LOW (ref 36.0–46.0)
Hemoglobin: 8.8 g/dL — ABNORMAL LOW (ref 12.0–15.0)
MCH: 30.6 pg (ref 26.0–34.0)
MCHC: 31.2 g/dL (ref 30.0–36.0)
MCV: 97.9 fL (ref 80.0–100.0)
Platelets: 108 10*3/uL — ABNORMAL LOW (ref 150–400)
RBC: 2.88 MIL/uL — ABNORMAL LOW (ref 3.87–5.11)
RDW: 20 % — ABNORMAL HIGH (ref 11.5–15.5)
WBC: 7 10*3/uL (ref 4.0–10.5)
nRBC: 0.3 % — ABNORMAL HIGH (ref 0.0–0.2)

## 2020-03-04 LAB — BPAM RBC
Blood Product Expiration Date: 202110252359
Blood Product Expiration Date: 202110252359
Blood Product Expiration Date: 202110252359
Blood Product Expiration Date: 202110262359
Blood Product Expiration Date: 202110262359
Blood Product Expiration Date: 202110262359
Blood Product Expiration Date: 202110272359
Blood Product Expiration Date: 202110282359
ISSUE DATE / TIME: 202109260757
ISSUE DATE / TIME: 202109260757
ISSUE DATE / TIME: 202109261319
ISSUE DATE / TIME: 202109261337
Unit Type and Rh: 5100
Unit Type and Rh: 5100
Unit Type and Rh: 5100
Unit Type and Rh: 5100
Unit Type and Rh: 5100
Unit Type and Rh: 5100
Unit Type and Rh: 5100
Unit Type and Rh: 5100

## 2020-03-04 LAB — HEPARIN LEVEL (UNFRACTIONATED): Heparin Unfractionated: 0.3 IU/mL (ref 0.30–0.70)

## 2020-03-04 LAB — GLUCOSE, CAPILLARY
Glucose-Capillary: 216 mg/dL — ABNORMAL HIGH (ref 70–99)
Glucose-Capillary: 257 mg/dL — ABNORMAL HIGH (ref 70–99)

## 2020-03-04 LAB — MAGNESIUM: Magnesium: 2.8 mg/dL — ABNORMAL HIGH (ref 1.7–2.4)

## 2020-03-04 LAB — LACTIC ACID, PLASMA: Lactic Acid, Venous: 1.2 mmol/L (ref 0.5–1.9)

## 2020-03-04 LAB — LACTATE DEHYDROGENASE: LDH: 240 U/L — ABNORMAL HIGH (ref 98–192)

## 2020-03-04 LAB — FIBRINOGEN: Fibrinogen: 685 mg/dL — ABNORMAL HIGH (ref 210–475)

## 2020-03-04 MED ORDER — NALOXONE HCL 0.4 MG/ML IJ SOLN
0.4000 mg | INTRAMUSCULAR | Status: DC | PRN
  Administered 2020-03-04: 0.4 mg via INTRAVENOUS
  Filled 2020-03-04: qty 1

## 2020-03-04 MED ORDER — CLONAZEPAM 0.5 MG PO TBDP: 0.5000 mg | ORAL_TABLET | Freq: Four times a day (QID) | ORAL | Status: DC

## 2020-03-04 MED ORDER — NALOXONE HCL 0.4 MG/ML IJ SOLN
INTRAMUSCULAR | Status: AC
  Administered 2020-03-04: 0.4 mg via INTRAVENOUS
  Filled 2020-03-04: qty 1

## 2020-03-04 MED ORDER — MIDAZOLAM 50MG/50ML (1MG/ML) PREMIX INFUSION
0.5000 mg/h | INTRAVENOUS | Status: DC
  Administered 2020-03-04: 5 mg/h via INTRAVENOUS
  Filled 2020-03-04: qty 50

## 2020-03-04 MED ORDER — ALBUMIN HUMAN 5 % IV SOLN
INTRAVENOUS | Status: AC
  Filled 2020-03-04: qty 250

## 2020-03-04 MED ORDER — NALOXONE HCL 2 MG/2ML IJ SOSY
1.0000 mg | PREFILLED_SYRINGE | Freq: Once | INTRAMUSCULAR | Status: AC
  Administered 2020-03-04: 0.8 mg via INTRAVENOUS
  Filled 2020-03-04: qty 2

## 2020-03-04 MED ORDER — FENTANYL 2500MCG IN NS 250ML (10MCG/ML) PREMIX INFUSION
0.0000 ug/h | INTRAVENOUS | Status: DC
  Administered 2020-03-04: 300 ug/h via INTRAVENOUS
  Filled 2020-03-04: qty 250

## 2020-03-04 MED ORDER — SODIUM BICARBONATE 8.4 % IV SOLN
INTRAVENOUS | Status: AC
  Filled 2020-03-04: qty 50

## 2020-03-04 MED ORDER — CALCIUM CHLORIDE 10 % IV SOLN
INTRAVENOUS | Status: AC
  Filled 2020-03-04: qty 10

## 2020-03-04 MED ORDER — OXYCODONE HCL 5 MG/5ML PO SOLN: 10.0000 mg | ORAL | Status: DC | PRN

## 2020-03-04 MED ORDER — PRISMASOL BGK 4/2.5 32-4-2.5 MEQ/L IV SOLN: INTRAVENOUS | Status: DC

## 2020-03-04 MED ORDER — PHENYLEPHRINE 40 MCG/ML (10ML) SYRINGE FOR IV PUSH (FOR BLOOD PRESSURE SUPPORT)
PREFILLED_SYRINGE | INTRAVENOUS | Status: AC
  Filled 2020-03-04: qty 10

## 2020-03-04 MED ORDER — EPINEPHRINE 1 MG/10ML IJ SOSY
PREFILLED_SYRINGE | INTRAMUSCULAR | Status: AC
  Filled 2020-03-04: qty 20

## 2020-03-05 LAB — BPAM RBC
Blood Product Expiration Date: 202110252359
Blood Product Expiration Date: 202110252359
Blood Product Expiration Date: 202110272359
Blood Product Expiration Date: 202110282359
Unit Type and Rh: 5100
Unit Type and Rh: 5100
Unit Type and Rh: 5100
Unit Type and Rh: 5100

## 2020-03-05 LAB — TYPE AND SCREEN
ABO/RH(D): O POS
Antibody Screen: NEGATIVE
Unit division: 0
Unit division: 0
Unit division: 0
Unit division: 0

## 2020-03-08 NOTE — Plan of Care (Signed)
Problem: Education: Goal: Knowledge of General Education information will improve Description: Including pain rating scale, medication(s)/side effects and non-pharmacologic comfort measures Outcome: Progressing   Problem: Health Behavior/Discharge Planning: Goal: Ability to manage health-related needs will improve Outcome: Progressing   Problem: Clinical Measurements: Goal: Ability to maintain clinical measurements within normal limits will improve Outcome: Progressing Goal: Will remain free from infection Outcome: Progressing Goal: Diagnostic test results will improve Outcome: Progressing Goal: Respiratory complications will improve Outcome: Progressing Goal: Cardiovascular complication will be avoided Outcome: Progressing   Problem: Activity: Goal: Risk for activity intolerance will decrease Outcome: Progressing   Problem: Nutrition: Goal: Adequate nutrition will be maintained Outcome: Progressing   Problem: Coping: Goal: Level of anxiety will decrease Outcome: Progressing   Problem: Elimination: Goal: Will not experience complications related to bowel motility Outcome: Progressing Goal: Will not experience complications related to urinary retention Outcome: Progressing   Problem: Pain Managment: Goal: General experience of comfort will improve Outcome: Progressing   Problem: Safety: Goal: Ability to remain free from injury will improve Outcome: Progressing   Problem: Skin Integrity: Goal: Risk for impaired skin integrity will decrease Outcome: Progressing   Problem: Education: Goal: Knowledge of risk factors and measures for prevention of condition will improve Outcome: Progressing   Problem: Coping: Goal: Psychosocial and spiritual needs will be supported Outcome: Progressing   Problem: Respiratory: Goal: Will maintain a patent airway Outcome: Progressing Goal: Complications related to the disease process, condition or treatment will be avoided or  minimized Outcome: Progressing   Problem: Education: Goal: Knowledge of risk factors and measures for prevention of condition will improve Outcome: Progressing   Problem: Coping: Goal: Psychosocial and spiritual needs will be supported Outcome: Progressing   Problem: Respiratory: Goal: Will maintain a patent airway Outcome: Progressing Goal: Complications related to the disease process, condition or treatment will be avoided or minimized Outcome: Progressing   Problem: Education: Goal: Ability to describe self-care measures that may prevent or decrease complications (Diabetes Survival Skills Education) will improve Outcome: Progressing Goal: Individualized Educational Video(s) Outcome: Progressing   Problem: Coping: Goal: Ability to adjust to condition or change in health will improve Outcome: Progressing   Problem: Fluid Volume: Goal: Ability to maintain a balanced intake and output will improve Outcome: Progressing   Problem: Health Behavior/Discharge Planning: Goal: Ability to identify and utilize available resources and services will improve Outcome: Progressing Goal: Ability to manage health-related needs will improve Outcome: Progressing   Problem: Metabolic: Goal: Ability to maintain appropriate glucose levels will improve Outcome: Progressing   Problem: Nutritional: Goal: Maintenance of adequate nutrition will improve Outcome: Progressing Goal: Progress toward achieving an optimal weight will improve Outcome: Progressing   Problem: Skin Integrity: Goal: Risk for impaired skin integrity will decrease Outcome: Progressing   Problem: Tissue Perfusion: Goal: Adequacy of tissue perfusion will improve Outcome: Progressing   Problem: Education: Goal: Knowledge of risk factors and measures for prevention of condition will improve Outcome: Progressing   Problem: Coping: Goal: Psychosocial and spiritual needs will be supported Outcome: Progressing    Problem: Respiratory: Goal: Will maintain a patent airway Outcome: Progressing Goal: Complications related to the disease process, condition or treatment will be avoided or minimized Outcome: Progressing   Problem: Education: Goal: Knowledge of disease or condition will improve Outcome: Progressing Goal: Knowledge of secondary prevention will improve Outcome: Progressing Goal: Knowledge of patient specific risk factors addressed and post discharge goals established will improve Outcome: Progressing Goal: Individualized Educational Video(s) Outcome: Progressing   Problem: Coping: Goal: Will verbalize  positive feelings about self Outcome: Progressing Goal: Will identify appropriate support needs Outcome: Progressing   Problem: Health Behavior/Discharge Planning: Goal: Ability to manage health-related needs will improve Outcome: Progressing   Problem: Self-Care: Goal: Ability to participate in self-care as condition permits will improve Outcome: Progressing Goal: Verbalization of feelings and concerns over difficulty with self-care will improve Outcome: Progressing Goal: Ability to communicate needs accurately will improve Outcome: Progressing   Problem: Nutrition: Goal: Risk of aspiration will decrease Outcome: Progressing Goal: Dietary intake will improve Outcome: Progressing   Problem: Intracerebral Hemorrhage Tissue Perfusion: Goal: Complications of Intracerebral Hemorrhage will be minimized Outcome: Progressing   Problem: Ischemic Stroke/TIA Tissue Perfusion: Goal: Complications of ischemic stroke/TIA will be minimized Outcome: Progressing   Problem: Spontaneous Subarachnoid Hemorrhage Tissue Perfusion: Goal: Complications of Spontaneous Subarachnoid Hemorrhage will be minimized Outcome: Progressing

## 2020-03-08 NOTE — Progress Notes (Signed)
PT Cancellation Note  Patient Details Name: Alisha Stephenson MRN: [id number] DOB: 03/19/1966   Cancelled Treatment:    Reason Eval/Treat Not Completed: Other (comment) (pt lethargic and unable to arouse to participate in therapy at this time)   Brandan Robicheaux B [name] 03/25/2020, 8:45 AM  Bayard Males, PT Acute Rehabilitation Services Pager: [phone] Office: [phone]

## 2020-03-08 NOTE — Progress Notes (Signed)
PT Cancellation Note  Patient Details Name: Alisha Stephenson MRN: [id number] DOB: 06-24-1965   Cancelled Treatment:    Reason Eval/Treat Not Completed: (P) Other (comment);Patient at procedure or test/unavailable;Patient's level of consciousness Pt off floor for head CT after period of decreased arousal and lethargy. PT will follow back for hydrotherapy tomorrow.   [name] B. Migdalia Dk PT, DPT Acute Rehabilitation Services Pager [phone] Office [phone]    [place] 03/10/20, 11:07 AM

## 2020-03-08 NOTE — Progress Notes (Signed)
SLP Cancellation Note  Patient Details Name: JACQUETTE CANALES MRN: [id number] DOB: 14-Apr-1966   Cancelled treatment:       Reason Eval/Treat Not Completed: Other (comment). Could not get in contact to discuss directly, but given prior report of lethargy will try for inline PMSV tomorrow as pt does not do well with trials when lethargic.   Herbie Baltimore, MA CCC-SLP  Acute Rehabilitation Services Pager [phone] Office [phone]  Lynann Beaver 03/22/2020, 10:24 AM

## 2020-03-08 NOTE — Death Summary Note (Addendum)
DEATH SUMMARY   Patient Details  Name: Alisha Stephenson MRN: [id number] DOB: January 08, 1966  Admission/Discharge Information   Admit Date:  22-Jan-2020  Date of Death: Date of Death: 18-Mar-2020  Time of Death: Time of Death: 09/11/1557  Length of Stay: 09-12-2054  Referring Physician: Shelda Pal, DO   Reason(s) for Hospitalization  Acute respiratory failure with hypoxemia due to COVID-19 Pneumonia  Diagnoses  Preliminary cause of death: Left frontal lobe hemorrhage with mass effect and herniation Secondary Diagnoses (including complications and co-morbidities): ARDS due to COVID-19 Principal Problem:   ARDS (adult respiratory distress syndrome) ([place]) Active Problems:   NAFLD (nonalcoholic fatty liver disease)   Diabetes mellitus type 1.5, managed as type 1 ([place])   COVID-19   Intracerebral hemorrhage   Personal history of ECMO   Palliative care by specialist   Goals of care, counseling/discussion   Acute respiratory failure with hypoxemia ([place])   Cardiorespiratory failure ([place])   Pressure injury of skin   Palliative care encounter   Brief Hospital Course (including significant findings, care, treatment, and services provided and events leading to death)  Alisha Stephenson is a 54 y.o. year old female who was admitted to the hospital 2020-01-22 with sever acute respiratory failure with hypoxemia due to COVID-19 pneumonia. The patient was intubated on 01/09/2020. The patient had persistent hypoxemic respiratory failure despite full mechanical ventilatory support and neuromuscular blockade, and the patient was cannulated for VV-ECMO 01/31/2020. On 55/13 neurology was consulted due to multifocal ICH. The patient was extubated 8/16 and started on CVVHD on 8/22. On 8/24 the patient required reintubation, and underwent tracheostomy on 8/30. The patient briefly was able to transition to intermittent HD on 9/7, however was transitioned back to CVVHD on 9/9. The patient made small improvements, and was working  with physical therapy. On 9/26 the patient had mild improvements on CT chest, however overnight .On 03-19-2055 the patient went for CT Head due to somnolence and bradycardia, and was found to have terminal intraparenchymal bleed.  Family allowed her to pass in peace after saying their goodbyes.   Pertinent Labs and Studies  Significant Diagnostic Studies DG Chest 1 View  Result Date: 02/12/2020 CLINICAL DATA:  ARDS on ECMO EXAM: CHEST  1 VIEW COMPARISON:  Yesterday FINDINGS: ECMO catheter in stable position. The tracheostomy tube, bilateral central lines, and enteric tube are unremarkable. The enteric tube is now a gastric suction tube instead of a feeding tube. Unchanged extensive pulmonary opacity. No visible air leak. Stable heart size. IMPRESSION: Unremarkable hardware positioning and stable confluent airspace disease. Electronically Signed   By: Monte Fantasia M.D.   On: 02/12/2020 09:49   DG Abd 1 View  Result Date: 02/09/2020 CLINICAL DATA:  NG placement EXAM: ABDOMEN - 1 VIEW COMPARISON:  Chest 02/09/2020 FINDINGS: Feeding tube enters the stomach with the tip in the region of the antrum or duodenal bulb. Normal bowel gas pattern Large bore vascular catheter in the right atrium/distal IVC unchanged from chest x-ray. IMPRESSION: Feeding tube tip in the region the gastric antrum or duodenal bulb. Normal bowel gas pattern. Electronically Signed   By: Franchot Gallo M.D.   On: 02/09/2020 12:08   CT HEAD WO CONTRAST  Result Date: 03/18/2020 CLINICAL DATA:  Neuro deficit, acute, stroke suspected. Additional history provided:? Septic emboli EXAM: CT HEAD WITHOUT CONTRAST TECHNIQUE: Contiguous axial images were obtained from the base of the skull through the vertex without intravenous contrast. COMPARISON:  Prior noncontrast head CT examinations 01/22/2020 and earlier. FINDINGS:  Brain: New from the prior head CT of 01/22/2020, there is a very large acute hemorrhage within the left frontal region with  parenchymal and extra-axial components. The hemorrhage measures 8.3 x 3.9 x 6.2 cm (AP x TV x CC) (for instance as seen on series 3, image 24) (series 5, image 23). There is also intraventricular extension of hemorrhage most notably within the left lateral, third and fourth ventricles. Marked mass effect complete effacement of the left lateral ventricle. Asymmetric prominence of the right lateral ventricle consistent with entrapment. There is rightward subfalcine herniation as well as left uncal herniation. Subarachnoid hemorrhage extends into the left sylvian fissure. The previous sites of parenchymal hemorrhage elsewhere within the brain have resolved. An exception to this is a 3.1 cm cavity within the right parietal lobe with a small amount of peripheral hemorrhage (series 3, image 25). Vascular: No hyperdense vessel is appreciated Skull: Normal. Negative for fracture or focal lesion. Sinuses/Orbits: Visualized orbits show no acute finding. Frothy secretions and severe mucosal thickening within the right sphenoid sinus. Mild left maxillary sinus mucosal thickening. Bilateral mastoid effusions. These results were called by telephone at the time of interpretation on 03-19-2020 at 11:14 am to provider [facility] , who verbally acknowledged these results. IMPRESSION: New from the prior head CT of 01/22/2020, there is a very large acute hemorrhage within the left frontal region with parenchymal and extra-axial components. The hemorrhage measures 8.3 x 3.9 x 6.2 cm (AP x TV x CC). Intraventricular extension of hemorrhage most notably within the left lateral, third and fourth ventricles. Subarachnoid hemorrhage extends into the left sylvian fissure. Marked mass effect with complete effacement of the left lateral ventricle. Asymmetric prominence of the right lateral ventricle consistent with entrapment. There is rightward subfalcine herniation as well as left uncal herniation. The sites of previous parenchymal  hemorrhage elsewhere within the brain have resolved. An exception to this is a 3.1 cm cavity within the right parietal lobe with a small amount of residual peripheral hemorrhage. Electronically Signed   By: Kellie Simmering DO   On: 19-Mar-2020 11:16   CT CHEST WO CONTRAST  Result Date: 03/03/2020 CLINICAL DATA:  ARDS, known COVID-19 EXAM: CT CHEST WITHOUT CONTRAST TECHNIQUE: Multidetector CT imaging of the chest was performed following the standard protocol without IV contrast. COMPARISON:  Plain film from earlier in the same day as well as prior CT from 01/19/2020 FINDINGS: Cardiovascular: Thoracic aorta demonstrates no aneurysmal dilatation. No significant cardiac enlargement is noted. ECMO cannula is noted extending from the right jugular vein inferiorly to the IVC. Right-sided PICC line is identified extending into the superior vena cava. Left jugular temporary dialysis catheter is noted. These are all stable from the recent chest x-ray. Mediastinum/Nodes: Thoracic inlet is within normal limits. Tracheostomy tube is noted. No sizable hilar or mediastinal adenopathy is noted. The esophagus is within normal limits. Lungs/Pleura: Small right-sided pleural effusion is noted. Diffuse airspace opacities are again identified similar to that seen on recent chest x-ray but slightly improved when compared with the prior CT examination from 1 month ago. These changes are consistent with sequelae from prior COVID-19 infection. Small right pneumothorax is noted anteriorly. Upper Abdomen: Visualized upper abdomen shows no acute abnormality. Musculoskeletal: Degenerative changes of the thoracic spine are noted. IMPRESSION: Persistent bilateral airspace opacities similar to that seen on recent chest x-ray but significantly improved when compared with the prior CT from 01/19/2020. Small right-sided pleural effusion is noted. Tiny right anterior pneumothorax likely chronic in nature. Tubes and  lines as described above.  Electronically Signed   By: Inez Catalina M.D.   On: 03/03/2020 10:32   IR GASTROSTOMY TUBE MOD SED  Result Date: 02/16/2020 INDICATION: 54 year old female currently on ECMO and at risk for aspiration. She requires percutaneous gastrojejunostomy access to allow for enteral feeding. EXAM: Fluoroscopically guided placement of percutaneous pull-through gastrostomy tube Conversion to gastrojejunostomy tube Interventional Radiologist:  Criselda Peaches, MD MEDICATIONS: 2 g Ancef; Antibiotics were administered within 1 hour of the procedure. ANESTHESIA/SEDATION: Versed 2 mg IV; Fentanyl 50 mcg IV Moderate Sedation Time:  22 minutes The patient was continuously monitored during the procedure by the interventional radiology nurse under my direct supervision. CONTRAST:  4mL OMNIPAQUE IOHEXOL 300 MG/ML  SOLN FLUOROSCOPY TIME:  Fluoroscopy Time: 8 minutes 42 seconds (145 mGy). COMPLICATIONS: None immediate. PROCEDURE: Informed written consent was obtained from the patient after a thorough discussion of the procedural risks, benefits and alternatives. All questions were addressed. Maximal Sterile Barrier Technique was utilized including caps, mask, sterile gowns, sterile gloves, sterile drape, hand hygiene and skin antiseptic. A timeout was performed prior to the initiation of the procedure. Maximal barrier sterile technique utilized including caps, mask, sterile gowns, sterile gloves, large sterile drape, hand hygiene, and chlorhexadine skin prep. An angled catheter was advanced over a wire under fluoroscopic guidance through the nose, down the esophagus and into the body of the stomach. The stomach was then insufflated with several 100 ml of air. Fluoroscopy confirmed location of the gastric bubble, as well as inferior displacement of the barium stained colon. Under direct fluoroscopic guidance, a single T-tack was placed, and the anterior gastric wall drawn up against the anterior abdominal wall. Percutaneous access  was then obtained into the mid gastric body with an 18 gauge sheath needle. Aspiration of air, and injection of contrast material under fluoroscopy confirmed needle placement. An Amplatz wire was advanced in the gastric body and the access needle exchanged for a 9-French vascular sheath. A snare device was advanced through the vascular sheath and an Amplatz wire advanced through the angled catheter. The Amplatz wire was successfully snared and this was pulled up through the esophagus and out the mouth. A 20-French Alinda Dooms MIC-PEG tube was then connected to the snare and pulled through the mouth, down the esophagus, into the stomach and out to the anterior abdominal wall. Hand injection of contrast material confirmed intragastric location. The T-tack retention suture was then cut. A C2 cobra catheter and Glidewire were then advanced coaxially through the gastrostomy tube into the stomach. The catheter and wire combination was successfully advanced through the pylorus and into the proximal small bowel. A 9 French jejunal limb was then advanced over the wire and position with the tip in the proximal jejunum. A gentle hand injection of contrast material was performed confirming the location of the tube. The external bumper was secured in place. The tube was flushed through the jejunal and gastric lumen and both were capped. The patient will be observed for several hours with the newly placed tube on low wall suction to evaluate for any post procedure complication. The patient tolerated the procedure well, there is no immediate complication. IMPRESSION: Successful placement of a 20 French pull through gastrostomy tube converted to a gastro jejunostomy tumor with the addition of a 9 [place] jejunal arm. The J arm tip is in the proximal jejunum and ready for use. Electronically Signed   By: Jacqulynn Cadet M.D.   On: 02/16/2020 16:41   IR GASTR  TUBE CONVERT GASTR-JEJ PER W/FL MOD SED  Result Date:  02/16/2020 INDICATION: 54 year old female currently on ECMO and at risk for aspiration. She requires percutaneous gastrojejunostomy access to allow for enteral feeding. EXAM: Fluoroscopically guided placement of percutaneous pull-through gastrostomy tube Conversion to gastrojejunostomy tube Interventional Radiologist:  Criselda Peaches, MD MEDICATIONS: 2 g Ancef; Antibiotics were administered within 1 hour of the procedure. ANESTHESIA/SEDATION: Versed 2 mg IV; Fentanyl 50 mcg IV Moderate Sedation Time:  22 minutes The patient was continuously monitored during the procedure by the interventional radiology nurse under my direct supervision. CONTRAST:  28mL OMNIPAQUE IOHEXOL 300 MG/ML  SOLN FLUOROSCOPY TIME:  Fluoroscopy Time: 8 minutes 42 seconds (145 mGy). COMPLICATIONS: None immediate. PROCEDURE: Informed written consent was obtained from the patient after a thorough discussion of the procedural risks, benefits and alternatives. All questions were addressed. Maximal Sterile Barrier Technique was utilized including caps, mask, sterile gowns, sterile gloves, sterile drape, hand hygiene and skin antiseptic. A timeout was performed prior to the initiation of the procedure. Maximal barrier sterile technique utilized including caps, mask, sterile gowns, sterile gloves, large sterile drape, hand hygiene, and chlorhexadine skin prep. An angled catheter was advanced over a wire under fluoroscopic guidance through the nose, down the esophagus and into the body of the stomach. The stomach was then insufflated with several 100 ml of air. Fluoroscopy confirmed location of the gastric bubble, as well as inferior displacement of the barium stained colon. Under direct fluoroscopic guidance, a single T-tack was placed, and the anterior gastric wall drawn up against the anterior abdominal wall. Percutaneous access was then obtained into the mid gastric body with an 18 gauge sheath needle. Aspiration of air, and injection of  contrast material under fluoroscopy confirmed needle placement. An Amplatz wire was advanced in the gastric body and the access needle exchanged for a 9-French vascular sheath. A snare device was advanced through the vascular sheath and an Amplatz wire advanced through the angled catheter. The Amplatz wire was successfully snared and this was pulled up through the esophagus and out the mouth. A 20-French Alinda Dooms MIC-PEG tube was then connected to the snare and pulled through the mouth, down the esophagus, into the stomach and out to the anterior abdominal wall. Hand injection of contrast material confirmed intragastric location. The T-tack retention suture was then cut. A C2 cobra catheter and Glidewire were then advanced coaxially through the gastrostomy tube into the stomach. The catheter and wire combination was successfully advanced through the pylorus and into the proximal small bowel. A 9 French jejunal limb was then advanced over the wire and position with the tip in the proximal jejunum. A gentle hand injection of contrast material was performed confirming the location of the tube. The external bumper was secured in place. The tube was flushed through the jejunal and gastric lumen and both were capped. The patient will be observed for several hours with the newly placed tube on low wall suction to evaluate for any post procedure complication. The patient tolerated the procedure well, there is no immediate complication. IMPRESSION: Successful placement of a 20 French pull through gastrostomy tube converted to a gastro jejunostomy tumor with the addition of a 9 [place] jejunal arm. The J arm tip is in the proximal jejunum and ready for use. Electronically Signed   By: Jacqulynn Cadet M.D.   On: 02/16/2020 16:41   DG CHEST PORT 1 VIEW  Result Date: 03-31-20 CLINICAL DATA:  Tracheostomy.  ARDS.  ECMO. EXAM: PORTABLE CHEST  1 VIEW COMPARISON:  CT 03/03/2020.  Chest x-ray 03/03/2020. FINDINGS:  Tracheostomy tube, left IJ line, right PICC line, ECMO device in stable position. Stable cardiomegaly. Diffuse dense bilateral pulmonary infiltrates again noted. Right pleural effusion again noted. Previously identified tiny right anterior pneumothorax not identified by chest x-ray. Prior cervical spine fusion. IMPRESSION: 1. Lines and tubes in stable position. 2. Stable cardiomegaly. 3. Diffuse dense bilateral pulmonary infiltrates again noted without interim change. Right pleural effusion again noted. 4. Previously identified tiny right anterior pneumothorax not identified by chest x-ray. Electronically Signed   By: Marcello Moores  Register   On: 03-20-20 06:25   DG CHEST PORT 1 VIEW  Result Date: 03/03/2020 CLINICAL DATA:  History of COVID-19 positivity on ECMO EXAM: PORTABLE CHEST 1 VIEW COMPARISON:  03/02/2020 FINDINGS: Cardiac shadow is stable but obscured by the lung opacities. Diffuse airspace opacities are noted similar to that seen on the prior exam. Right-sided PICC line, ECMO cannula and left jugular temporary dialysis catheter are seen and stable. Tracheostomy tube is noted in satisfactory position. No bony abnormality is noted. IMPRESSION: Overall stable appearance when compared with the prior day. Electronically Signed   By: Inez Catalina M.D.   On: 03/03/2020 10:33   DG CHEST PORT 1 VIEW  Result Date: 03/02/2020 CLINICAL DATA:  Patient with cardio respiratory failure. EXAM: PORTABLE CHEST 1 VIEW COMPARISON:  Chest radiograph March 01, 2020. FINDINGS: Tracheostomy tube terminates at the thoracic inlet. Left IJ central venous catheter tip projects over the superior vena cava. ECMO catheter projects at the inferior aspect of the vena cava. Right upper extremity PICC line visualized to the superior vena cava. Monitoring leads overlie the patient. Obscured cardiac and mediastinal contours. Relatively unchanged diffuse bilateral airspace opacities. Possible small bilateral pleural effusions. No  definite pneumothorax. IMPRESSION: Similar-appearing diffuse bilateral airspace opacities. Stable support apparatus. Electronically Signed   By: Lovey Newcomer M.D.   On: 03/02/2020 09:55   DG CHEST PORT 1 VIEW  Result Date: 03/01/2020 CLINICAL DATA:  Respiratory failure with ECMO treatment EXAM: PORTABLE CHEST 1 VIEW COMPARISON:  February 29, 2020 FINDINGS: Tracheostomy catheter tip is 6.6 cm above the carina. ECMO catheter tip in inferior vena cava. Left jugular catheter tip is in the superior vena cava. No pneumothorax. Widespread airspace opacity persists throughout the lungs without change. There is cardiomegaly, stable. Pulmonary vascularity appears grossly within normal limits, limited in visualization due to the overlying airspace consolidation. No bone lesions. IMPRESSION: Stable widespread airspace opacity bilaterally. Stable cardiac prominence. Tube and catheter positions as described. No pneumothorax evident. Electronically Signed   By: Lowella Grip III M.D.   On: 03/01/2020 08:11   DG CHEST PORT 1 VIEW  Result Date: 02/29/2020 CLINICAL DATA:  ARDS.  Tracheostomy.  ECMO. EXAM: PORTABLE CHEST 1 VIEW COMPARISON:  02/28/2020. FINDINGS: Tracheostomy tube, left IJ line, right PICC line, ECMO catheter stable position. Stable cardiomegaly. Diffuse severe bilateral pulmonary infiltrates again noted without interim change. No prominent pleural effusion. No pneumothorax. Prior cervical spine fusion. IMPRESSION: 1.  Lines and tubes including ECMO device in stable position. 2.  Stable cardiomegaly. 3. Diffuse severe bilateral pulmonary infiltrates again noted without interim change. Electronically Signed   By: Marcello Moores  Register   On: 02/29/2020 06:52   DG CHEST PORT 1 VIEW  Result Date: 02/28/2020 CLINICAL DATA:  Trach, ARDS, ECMO EXAM: PORTABLE CHEST 1 VIEW COMPARISON:  02/27/2020 FINDINGS: Tracheostomy, left dialysis catheter and ECMO device remain in place, unchanged. Diffuse bilateral airspace  disease. Probable loculated right  pleural effusion. Mild cardiomegaly. Right PICC line remains in place. No significant change since prior study. IMPRESSION: No interval change. Electronically Signed   By: Rolm Baptise M.D.   On: 02/28/2020 08:12   DG CHEST PORT 1 VIEW  Result Date: 02/27/2020 CLINICAL DATA:  Tracheostomy.  ECMO. EXAM: PORTABLE CHEST 1 VIEW COMPARISON:  02/26/2020. FINDINGS: Tracheostomy tube, left IJ line, right PICC line in stable position. ECMO device in unchanged position. Heart size stable. Diffuse severe bilateral pulmonary infiltrates again noted without interim change. Small right pleural effusion cannot be excluded. No pneumothorax. Prior cervical spine fusion. IMPRESSION: 1.  Lines and tubes in unchanged position. 2. Diffuse severe bilateral pulmonary infiltrates again noted without interim change. Small right pleural effusion cannot be excluded. Electronically Signed   By: Marcello Moores  Register   On: 02/27/2020 06:57   DG CHEST PORT 1 VIEW  Result Date: 02/26/2020 CLINICAL DATA:  ECMO catheter placement EXAM: PORTABLE CHEST 1 VIEW COMPARISON:  02/25/2020 FINDINGS: Tracheostomy, left internal jugular temporary hemodialysis catheter with its tip overlying the superior vena cava, right upper extremity PICC line with its tip within the terminal subclavian vein, and right internal jugular ECMO catheter with markers overlying the superior vena cava, superior cavoatrial junction, and inferior cavoatrial junction are again identified. Lung volumes are small, but are symmetric and are stable since prior examination. Superimposed extensive bilateral airspace infiltrate appears stable. No pneumothorax or pleural effusion. Cardiac size within normal limits. IMPRESSION: Support lines and tubes as described above, stable from prior examination. Advancement of the ECMO catheter by roughly 3 cm to better position the middle marker over the right atrium could be considered. Stable extensive bilateral  pulmonary infiltrates. Electronically Signed   By: Fidela Salisbury MD   On: 02/26/2020 06:15   DG CHEST PORT 1 VIEW  Result Date: 02/25/2020 CLINICAL DATA:  ECMO. EXAM: PORTABLE CHEST 1 VIEW COMPARISON:  02/24/2020 FINDINGS: Tracheostomy tube and left IJ central venous catheter are unchanged. Right-sided PICC line unchanged. ECMO apparatus unchanged. Lungs are somewhat hypoinflated with persistent diffuse bilateral airspace opacification with slightly improved overall aeration bilaterally. Possible small amount of bilateral pleural fluid unchanged. Cardiomediastinal silhouette and remainder of the exam is unchanged. IMPRESSION: 1. Persistent diffuse bilateral airspace process with slightly improved aeration bilaterally. Possible small amount bilateral pleural fluid unchanged. 2. Tubes and lines unchanged. Electronically Signed   By: Marin Olp M.D.   On: 02/25/2020 08:14   DG CHEST PORT 1 VIEW  Result Date: 02/24/2020 CLINICAL DATA:  Patient on ECMO. EXAM: PORTABLE CHEST 1 VIEW COMPARISON:  February 23, 2020 FINDINGS: The left central line, tracheostomy tube, right PICC line, an ECMO device are stable. No pneumothorax. Diffuse bilateral pulmonary infiltrates are more pronounced in the interval. No other changes. IMPRESSION: 1. Support apparatus as above. 2. Diffuse bilateral pulmonary infiltrates appear worsened in the interval. Electronically Signed   By: Dorise Bullion III M.D   On: 02/24/2020 10:03   DG CHEST PORT 1 VIEW  Result Date: 02/23/2020 CLINICAL DATA:  Respiratory failure, ECMO EXAM: PORTABLE CHEST 1 VIEW COMPARISON:  02/22/2020 FINDINGS: Support devices are stable. Extensive bilateral airspace disease. No change. Mild cardiomegaly. No visible effusions or pneumothorax. IMPRESSION: Support devices stable. Stable extensive diffuse bilateral airspace disease. Electronically Signed   By: Rolm Baptise M.D.   On: 02/23/2020 06:50   DG CHEST PORT 1 VIEW  Result Date: 02/22/2020 CLINICAL  DATA:  Hypoxia EXAM: PORTABLE CHEST 1 VIEW COMPARISON:  February 21, 2020 FINDINGS: Tracheostomy catheter tip  is 6.7 cm above the carina. ECMO catheter tip is in the inferior vena cava. Left jugular catheter tip is in the superior vena cava. No pneumothorax. There is widespread airspace opacity bilaterally. There is stable cardiomegaly. There is pulmonary venous hypertension. No adenopathy appreciable. Postoperative change noted in the lower cervical spine. IMPRESSION: Tube and catheter positions as described without pneumothorax. Persistent widespread airspace opacity throughout the lungs bilaterally. Suspect multifocal pneumonia with questionable degree of superimposed edema and/or ARDS. There is stable cardiomegaly with pulmonary vascular congestion. Electronically Signed   By: Lowella Grip III M.D.   On: 02/22/2020 08:27   DG CHEST PORT 1 VIEW  Result Date: 02/21/2020 CLINICAL DATA:  Respiratory failure. EXAM: PORTABLE CHEST 1 VIEW COMPARISON:  February 20, 2020. FINDINGS: Stable cardiomegaly. Tracheostomy is unchanged in position. ECMO device is unchanged. Left internal jugular catheter is unchanged. Stable bilateral lung opacities are noted consistent with multifocal pneumonia. Small pleural effusions may be present. No pneumothorax is noted. Bony thorax is unremarkable. IMPRESSION: Stable support apparatus. Stable bilateral lung opacities consistent with multifocal pneumonia. Electronically Signed   By: Marijo Conception M.D.   On: 02/21/2020 08:25   DG CHEST PORT 1 VIEW  Result Date: 02/20/2020 CLINICAL DATA:  ECMO, respiratory failure, COVID EXAM: PORTABLE CHEST 1 VIEW COMPARISON:  02/19/2020 FINDINGS: Support devices are stable. Severe diffuse bilateral airspace disease. Mild cardiomegaly. No change since prior study. IMPRESSION: No interval change. Electronically Signed   By: Rolm Baptise M.D.   On: 02/20/2020 08:15   DG CHEST PORT 1 VIEW  Result Date: 02/19/2020 CLINICAL DATA:  On ECMO.   ARDS. EXAM: PORTABLE CHEST 1 VIEW COMPARISON:  02/18/2020 FINDINGS: Tracheostomy tube overlies the airway. An ECMO cannula, right PICC, and left jugular venous catheter remain in place. The cardiomediastinal silhouette is unchanged. Widespread airspace opacities throughout both lungs are unchanged. Bilateral pleural effusions are similar to the prior study. No pneumothorax is identified. IMPRESSION: Unchanged diffuse bilateral airspace disease and bilateral pleural effusions. Electronically Signed   By: Logan Bores M.D.   On: 02/19/2020 08:25   DG CHEST PORT 1 VIEW  Result Date: 02/18/2020 CLINICAL DATA:  Cardio respiratory failure.  ECMO catheter present. EXAM: PORTABLE CHEST 1 VIEW COMPARISON:  February 17, 2020 FINDINGS: Tracheostomy catheter tip is 5.6 cm above the carina. ECMO catheter seen on the right with tip below the diaphragm. Central catheter tip from the right side in the superior vena cava. Left-sided central catheter tip is in the superior vena cava. No pneumothorax. There is diffuse interstitial and airspace opacity bilaterally, somewhat more on the right than on the left. Apparent superimposed pleural effusions noted. Heart upper normal in size with pulmonary vascularity grossly normal. No adenopathy evident. Postoperative change lower cervical spine. IMPRESSION: Tube and catheter positions as described without appreciable pneumothorax. Persistent pleural effusions with multifocal interstitial and alveolar opacity, similar to 1 day prior. Stable cardiac silhouette. Electronically Signed   By: Lowella Grip III M.D.   On: 02/18/2020 09:05   DG CHEST PORT 1 VIEW  Result Date: 02/17/2020 CLINICAL DATA:  Cardio respiratory failure, ECMO. EXAM: PORTABLE CHEST 1 VIEW COMPARISON:  02/16/2020 FINDINGS: RIGHT-sided PICC line obscured by ECMO cannula, extends into the mid to distal superior vena cava. LEFT-sided dual lumen catheter terminates at the proximal portion of the superior vena cava.  ECMO cannula in stable position. Slightly improved aeration in the chest compared to the previous study. There is still diffuse interstitial and airspace disease largely obscuring the cardiomediastinal  silhouette. Tracheostomy tube remains in place. Bilateral effusions largely stable. No acute skeletal process to the extent evaluated. IMPRESSION: 1. Stable support lines and tubes. 2. Slightly improved aeration in the chest compared to the previous study. 3. Stable bilateral effusions and with persistent diffuse interstitial and airspace disease. Electronically Signed   By: Zetta Bills M.D.   On: 02/17/2020 09:53   DG CHEST PORT 1 VIEW  Result Date: 02/16/2020 CLINICAL DATA:  COVID-19 infection on ECMO EXAM: PORTABLE CHEST 1 VIEW COMPARISON:  02/15/2020 FINDINGS: ECMO cannula, tracheostomy tube and left jugular temporary dialysis catheter are again noted and stable. Right-sided PICC line is seen extending to the cavoatrial junction. Gastric catheter extends into the stomach. Diffuse bilateral airspace opacity is noted with air bronchograms similar to that seen on the prior exam. No significant improvement is noted. No bony abnormality is seen. IMPRESSION: Diffuse bilateral opacities consistent with the given clinical history. Tubes and lines as described stable in appearance. Electronically Signed   By: Inez Catalina M.D.   On: 02/16/2020 06:33   DG CHEST PORT 1 VIEW  Result Date: 02/15/2020 CLINICAL DATA:  Encounter for hemodialysis.  Respiratory distress. EXAM: PORTABLE CHEST 1 VIEW COMPARISON:  Radiograph earlier this day. FINDINGS: New left internal jugular dialysis catheter tip in the upper SVC. No visualized pneumothorax. Right internal jugular ECMO catheter tip in the upper abdomen not entirely included in the field of view. Tracheostomy tube tip at the thoracic inlet. Enteric tube remains in place. Near complete opacification throughout both hemi thoraces with slight improved aeration from prior exam  and increasing ventilation in the perihilar lungs. IMPRESSION: 1. New left internal jugular dialysis catheter tip in the upper SVC. No visualized pneumothorax. 2. Additional support apparatus unchanged. 3. Near complete opacification throughout both lungs with slight improved aeration from prior exam and increasing ventilation in the perihilar lungs. Electronically Signed   By: Keith Rake M.D.   On: 02/15/2020 19:05   DG CHEST PORT 1 VIEW  Result Date: 02/15/2020 CLINICAL DATA:  Tracheostomy. ECMO. Respiratory failure. Recent COVID. EXAM: PORTABLE CHEST 1 VIEW COMPARISON:  02/14/2020. FINDINGS: Tracheostomy tube, NG tube, right PICC line, ECMO device in stable position. Left subclavian line stable position with tip over left subclavian vein. Complete consolidation of both lungs again noted. Heart is obscured. Prior cervical fusion. IMPRESSION: 1.  Lines and tubes stable position. 2. Complete consolidation of both lungs again noted. No interim change. Electronically Signed   By: Marcello Moores  Register   On: 02/15/2020 07:42   DG CHEST PORT 1 VIEW  Result Date: 02/14/2020 CLINICAL DATA:  ECMO EXAM: PORTABLE CHEST 1 VIEW COMPARISON:  Yesterday FINDINGS: Endotracheal tube tip is at the clavicular heads. Bilateral central line in unchanged position. ECMO catheter in stable position. The enteric tube at least reaches the mid stomach. Airless lungs and obscured heart size. No visible air leak. IMPRESSION: 1. Stable hardware positioning. 2. Airless lungs. Electronically Signed   By: Monte Fantasia M.D.   On: 02/14/2020 07:41   DG CHEST PORT 1 VIEW  Result Date: 02/13/2020 CLINICAL DATA:  Respiratory failure.  Recent COVID. EXAM: PORTABLE CHEST 1 VIEW COMPARISON:  02/12/2020 FINDINGS: Tracheostomy, enteric tube, and central lines are unchanged in position. Diffuse airspace consolidation in both lungs without change. Heart size is obscured. IMPRESSION: Diffuse airspace consolidation in both lungs without change.  Electronically Signed   By: Lucienne Capers M.D.   On: 02/13/2020 06:29   DG CHEST PORT 1 VIEW  Result Date: 02/11/2020  CLINICAL DATA:  ECMO EXAM: PORTABLE CHEST 1 VIEW COMPARISON:  02/10/2020 FINDINGS: Tracheostomy in satisfactory position.  ECMO catheter. Enteric tube courses into the stomach. Right arm PICC is obscured but likely terminates at the cavoatrial junction. Left subclavian catheter terminates at the level of the clavicle. Multifocal patchy opacities with slightly improved aeration of the lungs bilaterally. No pneumothorax. The cardiomediastinal silhouette is largely obscured. Cervical spine fixation hardware, incompletely visualized. IMPRESSION: Stable support apparatus in this patient on ECMO. Multifocal patchy opacities with slightly improved aeration of the lungs bilaterally. Electronically Signed   By: Julian Hy M.D.   On: 02/11/2020 07:44   DG CHEST PORT 1 VIEW  Result Date: 02/10/2020 CLINICAL DATA:  ECMO EXAM: PORTABLE CHEST 1 VIEW COMPARISON:  Yesterday FINDINGS: Tracheostomy tube in place. Left subclavian line with tip at the subclavian level. Feeding tube at least reaches the diaphragm. ECMO catheter in similar position. The ECMO catheter obscures the tip of the right PICC. Airless lungs. Obscured heart size. No visible air leak. IMPRESSION: 1. Stable hardware positioning. 2. Airless lungs. Electronically Signed   By: Monte Fantasia M.D.   On: 02/10/2020 08:47   DG CHEST PORT 1 VIEW  Result Date: 02/09/2020 CLINICAL DATA:  COVID positive.  Respiratory failure.  ECMO. EXAM: PORTABLE CHEST 1 VIEW COMPARISON:  Multiple recent previous exams. FINDINGS: Confluent bilateral airspace opacity is again noted and appears progressive in left upper lobe. Tracheostomy tube again noted in situ. Right PICC line tip is difficult to visualize but is probably in the right subclavian vein but may be obscured by the ECMO catheter. Left IJ central line tip is positioned over the innominate vein  confluence. There is a multi lumen left subclavian catheter with the tip overlying the medial aspect of the left subclavian vein. IMPRESSION: 1. Persistent diffuse bilateral airspace disease with interval progression in the left upper lobe. 2. Stable position of support tubing. Electronically Signed   By: Misty Stanley M.D.   On: 02/09/2020 07:21   DG CHEST PORT 1 VIEW  Result Date: 02/08/2020 CLINICAL DATA:  Respiratory failure on ECMO.  Recent COVID. EXAM: PORTABLE CHEST 1 VIEW COMPARISON:  02/07/2020 FINDINGS: Similar positioning of a midline tracheostomy tube, left IJ approach central venous catheter, left subclavian central line, right PICC line, feeding tube and ECMO device. Diffuse opacification of both hemi thoraces is again noted with slightly decreased aeration of the lungs. Cardiac silhouette is obscured. Prior cervical fusion hardware. IMPRESSION: Slightly decreased aeration of the lungs. Otherwise, no substantial change. Electronically Signed   By: Margaretha Sheffield MD   On: 02/08/2020 08:20   DG CHEST PORT 1 VIEW  Result Date: 02/07/2020 CLINICAL DATA:  COVID pneumonia with respiratory failure.  ECMO. EXAM: PORTABLE CHEST 1 VIEW COMPARISON:  Multiple recent previous exams. FINDINGS: The diffuse confluent airspace opacity in both lungs is again noted with some slight interval improvement at the left upper lung. Tracheostomy tube remains in place. A feeding tube passes into the stomach although the distal tip position is not included on the film. Left IJ central line tip overlies the innominate vein confluence. Left subclavian central line tip overlies the central left subclavian vein. Right PICC line tip overlies the mid SVC level. Telemetry leads overlie the chest. ECMO cannula again noted. IMPRESSION: Slight interval improvement in the left upper lung. Otherwise no substantial change in exam. Electronically Signed   By: Misty Stanley M.D.   On: 02/07/2020 08:54   DG CHEST PORT 1 VIEW  Result  Date: 02/06/2020 CLINICAL DATA:  Respiratory failure.  ECMO.  Recent COVID. EXAM: PORTABLE CHEST 1 VIEW COMPARISON:  02/05/2020. FINDINGS: Tracheostomy tube, feeding tube, left IJ line, right PICC line, ECMO device in stable position. Diffuse opacification of both hemi thoraces again noted with slight aeration in both lungs noted on today's exam. Heart size cannot be evaluated. Prior cervical spine fusion. IMPRESSION: 1.  Lines and tubes including ECMO device in stable position. 2. Diffuse opacification of both hemi thoraces again noted with slight aeration of both lungs noted on today's exam. Electronically Signed   By: Marcello Moores  Register   On: 02/06/2020 06:53   DG Chest Port 1 View  Result Date: 02/05/2020 CLINICAL DATA:  Shortness of breath. EXAM: PORTABLE CHEST 1 VIEW COMPARISON:  Same day. FINDINGS: Tracheostomy tube is in good position. Feeding tube is seen entering stomach. Right-sided PICC line is unchanged. Left internal jugular catheter is unchanged. ECMO device is noted. Complete opacification of the lungs is noted bilaterally suggesting pneumonia, atelectasis or effusion. No pneumothorax is noted. Bony thorax is unremarkable. IMPRESSION: Stable support apparatus. Complete opacification of the lungs is noted bilaterally suggesting pneumonia, atelectasis or effusion. Electronically Signed   By: Marijo Conception M.D.   On: 02/05/2020 14:53   DG CHEST PORT 1 VIEW  Result Date: 02/05/2020 CLINICAL DATA:  54 year old female with respiratory failure on ECMO. EXAM: PORTABLE CHEST 1 VIEW COMPARISON:  Chest CT scratch the chest x-ray 02/04/2020. FINDINGS: An endotracheal tube is in place with tip 4.8 cm above the carina. Right upper extremity PICC with tip terminating in the mid to distal superior vena cava. There is a left-sided internal jugular central venous catheter with tip terminating in the mid superior vena cava. A feeding tube is seen extending into the abdomen, however, the tip of the feeding tube  extends below the lower margin of the image. Esophageal thermistor noted. ECMO cannula noted. Complete opacification in the lungs bilaterally with multiple air bronchograms. Orthopedic fixation hardware in the lower cervical spine incidentally noted. IMPRESSION: 1. Radiographic appearance the chest is very similar to the prior study, as above. Electronically Signed   By: Vinnie Langton M.D.   On: 02/05/2020 08:15   DG CHEST PORT 1 VIEW  Result Date: 02/04/2020 CLINICAL DATA:  ECMO. EXAM: PORTABLE CHEST 1 VIEW COMPARISON:  February 03, 2020 FINDINGS: The ETT is in good position. The feeding tube terminates below today's film. A left central line is stable. A right PICC line is again seen although the distal tip is difficult to visualize. An ECMO device remains. Near complete opacification of the lungs remains. No other changes. A left subclavian central line is stable. IMPRESSION: 1. Support apparatus as above. 2. Near complete opacification of the lungs is stable. Electronically Signed   By: Dorise Bullion III M.D   On: 02/04/2020 10:51   DG Abd Portable 1V  Result Date: 02/12/2020 CLINICAL DATA:  Feeding tube dysfunction EXAM: PORTABLE ABDOMEN - 1 VIEW COMPARISON:  02/11/2020 FINDINGS: Nonobstructive bowel gas pattern. Enteric tube terminates in the mid gastric body. Additional feeding tube terminates in the distal gastric body and is looped in the stomach. Cholecystectomy clips. ECMO catheter. IMPRESSION: Nonobstructive bowel gas pattern. Enteric tube terminates in the mid gastric body. Additional feeding tube terminates in the distal gastric body and is looped in the stomach. Electronically Signed   By: Julian Hy M.D.   On: 02/12/2020 07:24   DG Abd Portable 1V  Result Date: 02/11/2020 CLINICAL DATA:  Emesis.  COVID-19 infection. Patient is currently on ECMO. EXAM: PORTABLE ABDOMEN - 1 VIEW COMPARISON:  02/09/2020; 01/30/2020; chest radiograph-02/11/2020; 02/10/2020 FINDINGS: Redemonstrated mild  gaseous distension of multiple loops of large and small bowel. There are two mildly dilated loops of small bowel within the midline of the upper abdomen which demonstrate potential minimal amount of bowel wall thickening. No pneumoperitoneum, pneumatosis or portal venous gas. Limited visualization of lower thorax demonstrates extensive bilateral consolidative opacities. Enteric tube tips project over the gastric antrum. ECMO cannulation device tip is unchanged in positioning overlying the T12 vertebral body. Post cholecystectomy. Radiopaque structure overlying the lower pelvis is likely external to the patient. No acute osseous abnormalities. Degenerative change of the bilateral hips. IMPRESSION: Similar findings most suggestive of ileus though there are 2 loops of small bowel which demonstrate potential bowel wall thickening as could be seen in the setting of an enteritis either of infectious, inflammatory or ischemic etiology. No pneumoperitoneum pneumatosis. Electronically Signed   By: Sandi Mariscal M.D.   On: 02/11/2020 09:57   DG Abd Portable 1V  Result Date: 02/09/2020 CLINICAL DATA:  Feeding tube placement. EXAM: PORTABLE ABDOMEN - 1 VIEW COMPARISON:  02/09/2020 FINDINGS: Feeding tube is been advanced into the duodenum with the tip in the second portion of duodenum. Central venous large-bore catheter extending into the distal IVC unchanged. Severe airspace disease bilaterally. No dilated bowel loops. IMPRESSION: Feeding tube has been advanced with the tip in the second portion the duodenum. Electronically Signed   By: Franchot Gallo M.D.   On: 02/09/2020 14:43   ECHOCARDIOGRAM LIMITED  Result Date: 02/13/2020    ECHOCARDIOGRAM LIMITED REPORT   Patient Name:   ZAHARAH AMIR Date of Exam: 02/13/2020 Medical Rec #:  [id number]       Height:       64.0 in Accession #:    [id number]      Weight:       267.9 lb Date of Birth:  1965-08-27        BSA:          2.215 m Patient Age:    54 years        BP:            109/48 mmHg Patient Gender: F               HR:           91 bpm. Exam Location:  Inpatient Procedure: Limited Echo, Limited Color Doppler and Cardiac Doppler Indications:    Aortic Valve Disorder 424.1 / I35.9  History:        Patient has prior history of Echocardiogram examinations, most                 recent 01/16/2020. Risk Factors:Diabetes and Former Smoker. ECMO.  Sonographer:    Vickie Epley RDCS Referring Phys: [id number] Larrisha Babineau R BENSIMHON  Sonographer Comments: Technically difficult study due to poor echo windows. IMPRESSIONS  1. Left ventricular ejection fraction, by estimation, is 65 to 70%. The left ventricle has normal function. The left ventricle has no regional wall motion abnormalities. Left ventricular diastolic function could not be evaluated.  2. Right ventricular systolic function is normal. The right ventricular size is normal. Tricuspid regurgitation signal is inadequate for assessing PA pressure.  3. The mitral valve is normal in structure. No evidence of mitral valve regurgitation. No evidence of mitral stenosis.  4. The aortic valve is normal in structure. Aortic valve regurgitation is not visualized. No aortic  stenosis is present.  5. The inferior vena cava is normal in size with greater than 50% respiratory variability, suggesting right atrial pressure of 3 mmHg. FINDINGS  Left Ventricle: Left ventricular ejection fraction, by estimation, is 65 to 70%. The left ventricle has normal function. The left ventricle has no regional wall motion abnormalities. The left ventricular internal cavity size was normal in size. There is  no left ventricular hypertrophy. Right Ventricle: The right ventricular size is normal. Right vetricular wall thickness was not assessed. Right ventricular systolic function is normal. Tricuspid regurgitation signal is inadequate for assessing PA pressure. Left Atrium: Left atrial size was normal in size. Right Atrium: Right atrial size was not well visualized. Pericardium:  There is no evidence of pericardial effusion. Mitral Valve: The mitral valve is normal in structure. Normal mobility of the mitral valve leaflets. No evidence of mitral valve stenosis. Tricuspid Valve: The tricuspid valve is normal in structure. Tricuspid valve regurgitation is not demonstrated. No evidence of tricuspid stenosis. Aortic Valve: The aortic valve is normal in structure. Aortic valve regurgitation is not visualized. No aortic stenosis is present. Pulmonic Valve: The pulmonic valve was not assessed. No evidence of pulmonic stenosis. Aorta: The aortic root is normal in size and structure. Venous: The inferior vena cava is normal in size with greater than 50% respiratory variability, suggesting right atrial pressure of 3 mmHg. IAS/Shunts: No atrial level shunt detected by color flow Doppler. LEFT VENTRICLE PLAX 2D LVIDd:         5.00 cm LVIDs:         3.10 cm LV PW:         0.90 cm LV IVS:        0.90 cm LVOT diam:     2.10 cm LV SV:         68 LV SV Index:   31 LVOT Area:     3.46 cm  LEFT ATRIUM         Index LA diam:    2.80 cm 1.26 cm/m  AORTIC VALVE LVOT Vmax:   118.00 cm/s LVOT Vmean:  79.800 cm/s LVOT VTI:    0.196 m MITRAL VALVE MV Area (PHT): 4.06 cm    SHUNTS MV Decel Time: 187 msec    Systemic VTI:  0.20 m MV E velocity: 82.70 cm/s  Systemic Diam: 2.10 cm MV A velocity: 56.10 cm/s MV E/A ratio:  1.47 Fransico Him MD Electronically signed by Fransico Him MD Signature Date/Time: 02/13/2020/3:30:58 PM    Final     Microbiology Recent Results (from the past 240 hour(s))  MRSA PCR Screening     Status: None   Collection Time: 02/24/20  8:07 PM   Specimen: Nasopharyngeal  Result Value Ref Range Status   MRSA by PCR NEGATIVE NEGATIVE Final    Comment:        The GeneXpert MRSA Assay (FDA approved for NASAL specimens only), is one component of a comprehensive MRSA colonization surveillance program. It is not intended to diagnose MRSA infection nor to guide or monitor treatment  for MRSA infections. Performed at [facility], [place] [address], [place], [place] [address]   Culture, respiratory (non-expectorated)     Status: None   Collection Time: 02/29/20  7:43 AM   Specimen: Tracheal Aspirate; Respiratory  Result Value Ref Range Status   Specimen Description TRACHEAL ASPIRATE  Final   Special Requests NONE  Final   Gram Stain   Final    MODERATE WBC PRESENT,  PREDOMINANTLY PMN ABUNDANT GRAM NEGATIVE RODS FEW GRAM POSITIVE COCCI IN PAIRS IN CLUSTERS Performed at [facility], [place] [address] [place], [place] [address]    Culture   Final    ABUNDANT PSEUDOMONAS AERUGINOSA MODERATE SERRATIA MARCESCENS    Report Status 03/03/2020 FINAL  Final   Organism ID, Bacteria PSEUDOMONAS AERUGINOSA  Final   Organism ID, Bacteria SERRATIA MARCESCENS  Final      Susceptibility   Pseudomonas aeruginosa - MIC*    CEFTAZIDIME 4 SENSITIVE Sensitive     CIPROFLOXACIN <=0.25 SENSITIVE Sensitive     GENTAMICIN 2 SENSITIVE Sensitive     IMIPENEM 1 SENSITIVE Sensitive     PIP/TAZO 8 SENSITIVE Sensitive     CEFEPIME 2 SENSITIVE Sensitive     * ABUNDANT PSEUDOMONAS AERUGINOSA   Serratia marcescens - MIC*    CEFAZOLIN >=64 RESISTANT Resistant     CEFEPIME <=0.12 SENSITIVE Sensitive     CEFTAZIDIME <=1 SENSITIVE Sensitive     CEFTRIAXONE <=0.25 SENSITIVE Sensitive     CIPROFLOXACIN <=0.25 SENSITIVE Sensitive     GENTAMICIN <=1 SENSITIVE Sensitive     TRIMETH/SULFA <=20 SENSITIVE Sensitive     * MODERATE SERRATIA MARCESCENS  Aerobic Culture  (superficial specimen)     Status: Abnormal   Collection Time: 02/29/20 12:38 PM   Specimen: Back; Wound  Result Value Ref Range Status   Specimen Description BACK  Final   Special Requests NONE  Final   Gram Stain   Final    NO WBC SEEN FEW GRAM POSITIVE COCCI RARE GRAM NEGATIVE RODS Performed at [facility], [address] [place], [place] [address]    Culture SERRATIA MARCESCENS PSEUDOMONAS AERUGINOSA  (A)   Final   Report Status 03/21/20 FINAL  Final   Organism ID, Bacteria SERRATIA MARCESCENS  Final   Organism ID, Bacteria PSEUDOMONAS AERUGINOSA  Final      Susceptibility   Pseudomonas aeruginosa - MIC*    CEFTAZIDIME 4 SENSITIVE Sensitive     CIPROFLOXACIN <=0.25 SENSITIVE Sensitive     GENTAMICIN 2 SENSITIVE Sensitive     IMIPENEM 1 SENSITIVE Sensitive     PIP/TAZO 8 SENSITIVE Sensitive     CEFEPIME 2 SENSITIVE Sensitive     * PSEUDOMONAS AERUGINOSA   Serratia marcescens - MIC*    CEFAZOLIN >=64 RESISTANT Resistant     CEFEPIME <=0.12 SENSITIVE Sensitive     CEFTAZIDIME <=1 SENSITIVE Sensitive     CEFTRIAXONE 8 RESISTANT Resistant     CIPROFLOXACIN <=0.25 SENSITIVE Sensitive     GENTAMICIN <=1 SENSITIVE Sensitive     TRIMETH/SULFA <=20 SENSITIVE Sensitive     * SERRATIA MARCESCENS    Lab Basic Metabolic Panel: Recent Labs  Lab 02/29/20 0411 02/29/20 0423 03/01/20 0427 03/01/20 0803 03/02/20 0222 03/02/20 0223 03/02/20 1526 03/02/20 1728 03/03/20 0330 03/03/20 0739 03/03/20 1513 03/03/20 1516 03/03/20 2314 03-21-20 0357 2020/03/21 0418 03-21-2020 0913 March 21, 2020 0958  NA 134*   < > 132*   < > 136   < > 133*   < > 134*   < > 134*   < > 137 133* 135 133* 135  K 4.5   < > 4.4   < > 3.5   < > 3.3*   < > 3.3*   < > 3.4*   < > 3.5 3.3* 3.3* 3.8 3.6  CL 99   < > 96*   < > 99  --  97*  --  98  --  99  --   --  99  --   --   --   CO2 25   < > 24   < > 24  --  23  --  23  --  22  --   --  22  --   --   --   GLUCOSE 174*   < > 179*   < > 207*  --  220*  --  222*  --  202*  --   --  227*  --   --   --   BUN 37*   < > 41*   < > 44*  --  43*  --  44*  --  42*  --   --  42*  --   --   --   CREATININE 1.03*   < > 1.04*   < > 1.02*  --  0.91  --  0.87  --  0.95  --   --  0.91  --   --   --   CALCIUM 8.8*   < > 8.8*   < > 9.1  --  9.2  --  9.4  --  8.9  --   --  9.3  --   --   --   MG 2.8*  --  2.8*  --  2.7*  --   --   --  2.7*  --   --   --   --  2.8*  --   --   --   PHOS 2.0*   < >  3.2   < > 2.5  --  3.5  --  2.7  --  3.0  --   --  2.3*  --   --   --    < > = values in this interval not displayed.   Liver Function Tests: Recent Labs  Lab 03/02/20 0222 03/02/20 1526 03/03/20 0330 03/03/20 1513 03/18/20 0357  ALBUMIN 3.3* 3.4* 3.3* 3.0* 3.3*   No results for input(s): LIPASE, AMYLASE in the last 168 hours. No results for input(s): AMMONIA in the last 168 hours. CBC: Recent Labs  Lab 02/28/20 1753 02/28/20 1952 03/02/20 0222 03/02/20 0223 03/02/20 1526 03/02/20 1728 03/03/20 0330 03/03/20 0739 03/03/20 1513 03/03/20 1516 03/03/20 2314 03-18-2020 0357 03-18-2020 0418 2020/03/18 0913 2020-03-18 0958  WBC 12.0*   < > 7.3  --  7.6  --  7.2  --  8.1  --   --  7.0  --   --   --   NEUTROABS 9.5*  --   --   --   --   --   --   --   --   --   --   --   --   --   --   HGB 8.8*   < > 9.1*   < > 9.4*   < > 9.3*   < > 9.1*   < > 9.5* 8.8* 9.2* 9.5* 9.5*  HCT 28.9*   < > 29.0*   < > 30.0*   < > 30.1*   < > 29.5*   < > 28.0* 28.2* 27.0* 28.0* 28.0*  MCV 102.5*   < > 98.6  --  99.3  --  97.1  --  97.7  --   --  97.9  --   --   --   PLT 113*   < > 99*  --  101*  --  104*  --  107*  --   --  108*  --   --   --    < > = values in this interval not displayed.   Cardiac Enzymes: No results for input(s): CKTOTAL, CKMB, CKMBINDEX, TROPONINI in the last 168 hours. Sepsis Labs: Recent Labs  Lab 03/02/20 1526 03/03/20 0330 03/03/20 1513 04/01/20 0357  WBC 7.6 7.2 8.1 7.0  LATICACIDVEN 2.1* 2.0* 1.5 1.2    Procedures/Operations   01/09/2020 intubation 01/24/2020 arterial line placement 01/26/2020 cannulation for VV ECMO 01/25/2020 arterial line placement 01/28/2020 CRRT started 01/30/2020 reintubated 01/30/2020 ECMO circuit changed 01/30/2020 bronchoscopy  02/05/2020 tracheostomy, bronchoscopy  02/06/2020 arterial line placement 02/15/2020 central line placement  02/16/2020 GJ tube placed 02/17/2020 arterial line placement 02/25/2020 ECMO circuit change     Eliseo Gum MSN,  AGACNP-BC With Erskine Emery MD [place] [phone] If no answer, [phone] 2020-04-01, 5:22 PM

## 2020-03-08 NOTE — Progress Notes (Signed)
Pt passed away at 1549. Family at bedside. Idelia Salm RN and Berline Chough RN listened for 3 minutes without hearing heart or lung sounds. 390ml of Fent and 24ml versed wasted in sink by RNs named above. Family took patient belongings home. Funeral home to meet RN to pick up body outside morgue.

## 2020-03-08 NOTE — Progress Notes (Signed)
[facility] Indication: ECMO  Allergies  Allergen Reactions  . Sulfa Antibiotics Rash and Other (See Comments)    Patient Measurements: Height: 5\' 4"  (162.6 cm) Weight: 112.6 kg (248 lb 3.8 oz) IBW/kg (Calculated) : 54.7 Heparin Dosing Weight: 87 kg  Vital Signs: Temp: 98.2 F (36.8 C) (09/26 2000) Temp Source: Oral (09/26 2000) BP: 100/53 (09/27 0239) Pulse Rate: 78 (09/27 0239)  Labs: Recent Labs    03/03/20 0330 03/03/20 0739 03/03/20 1513 03/03/20 1516 03/03/20 2314 03/03/20 2314 03-27-2020 0357 2020/03/27 0418  HGB 9.3*   < > 9.1*   < > 9.5*   < > 8.8* 9.2*  HCT 30.1*   < > 29.5*   < > 28.0*  --  28.2* 27.0*  PLT 104*  --  107*  --   --   --  108*  --   HEPARINUNFRC 0.19*  --  0.25*  --   --   --  0.30  --   CREATININE 0.87  --  0.95  --   --   --  0.91  --    < > = values in this interval not displayed.    Estimated Creatinine Clearance: 85.9 mL/min (by C-G formula based on SCr of 0.91 mg/dL).  Assessment: 54 yo female COVID+ on VV ECMO. Patient started on bivalirudin but was found to have multiple ICH on 8/13 head CT. Bivalirudin was stopped and pt was off anticoagulation at that time. Her ECMO circuit was changed 8/24. Pharmacy asked to start low-fixed rate heparin.   Heparin was started at low fixed rate for pump patency in setting of ICH. Concern with increased clotting off CRRT as well as circuit clots - s/p circuit exchange 9/17 so will titrate heparin to a low goal of 0.2-0.3  Heparin level at high end of therapeutic 0.3, on 1250 units/hr. Patient seems to have delayed ~24hr response to heparin rate changes. H/H, plt stable. Still having bleeding during suctioning - stable from earlier. No infusion issues.   Goal of Therapy:  HL 0.2-0.3 units/mL Monitor platelets by anticoagulation protocol: Yes   Plan:  Reduce IV heparin to 1200 units/hr  Monitor q12hr HL, CBC/plt Monitor for signs/symptoms of bleeding     Benetta Spar, PharmD, BCPS, BCCP Clinical Pharmacist  Please check AMION for all [place] phone numbers After 10:00 PM, call [place] [phone]

## 2020-03-08 NOTE — Progress Notes (Signed)
[facility] ([facility]) referral initiated. Referral # [id number]

## 2020-03-08 NOTE — Progress Notes (Signed)
Family decided to pursue comfort care. Patient was medicated and ECMO was clamped off per Dr. Tamala Julian at [phone]. Patient died at 63.

## 2020-03-08 NOTE — Progress Notes (Signed)
This chaplain understands from communication with RN-Kaylee, the family declined EOL spiritual care.

## 2020-03-08 NOTE — Progress Notes (Signed)
2020-04-01  PCCM Note  S: Worsening somnolence overnight with intermittent bradycardia.  Attributed to narcotics but stopping them and narcan had no effect.  Sent for stat CT showing large left frontal hemorrhage with mass effect and herniation.  O: Unresponsive Flows and circuit stable Pupils pintpoint and unresponsive  A: COVID ARDS on VV ECMO complicated by terminal left frontal lobe hemorrhage with mass effect and brain herniation.  P: Met with husband and daughter who will call family in to say their goodbyes. We will contact [facility] at their request Turn off pump when family ready  34 minutes critical care time

## 2020-03-08 NOTE — Progress Notes (Signed)
Patient remains on ECMO, still not stable enough to go to OR for debridement of sacral pressure wound. Events from yesterday and palliative care consult noted. General surgery will sign off for now, please re-consult when patient off ECMO if aggressive care measures still being pursued.   Norm Parcel , [facility] 31-Mar-2020, 7:24 AM Please see Amion for pager number during day hours 7:00am-4:30pm

## 2020-03-08 NOTE — Progress Notes (Signed)
Patient ID: Alisha Stephenson, female   DOB: 07-15-1965, 54 y.o.   MRN: [id number]    Advanced Heart Failure Rounding Note   Subjective:    - 8/2 COVID + test - 8/9 Cannulated for VV ECMO - 8/13 with several areas of intracranial hemorrhage. Bival stopped.  - 8/14 CT no change in [place]. Increased edema - 8/16 Extubated - 8/16 Head CT stable bleed - 8/22 CVVHD started - 8/24 reintubated - 8/24 circuit changed - 8/30 trach - 9/7  Switched to iHD - 02/15/20 Switched back to CVVHD - 9/10 GJ tube placed - 9/17 Circuit change - 9/26 Chest CT with mild improvement   Received pain meds overnight for sacral pain. Now heavily sedated. Sweep up to 7.   Remains on CVVHD. Had some chugging overnight and got some albumin  ECMO  Flow 4.6 L RPM 3700 Sweep7  Labs: 7.37/45/67/92% 40% FiO2 TV 350-400cc Hgb8.8 PLT 108k LDH 240 Lactic acid2.3 -> 1.5 -> 1.7 -> 2.0 -> 1.2 Heparin 0.30  Objective:   Weight Range:  Vital Signs:   Temp:  [97.8 F (36.6 C)-98.4 F (36.9 C)] 98.4 F (36.9 C) (09/27 0400) Pulse Rate:  [37-99] 65 (09/27 0700) Resp:  [24] 24 (09/27 0239) BP: (75-141)/(39-93) 105/56 (09/27 0700) SpO2:  [86 %-100 %] 98 % (09/27 0818) Arterial Line BP: (69-147)/(42-71) 74/62 (09/27 0700) FiO2 (%):  [40 %] 40 % (09/27 0818) Last BM Date: 03/02/20  Weight change: Filed Weights   03/01/20 0500 03/02/20 0500 03/03/20 0500  Weight: 114 kg 116.1 kg 112.6 kg    Intake/Output:   Intake/Output Summary (Last 24 hours) at 03-13-20 0921 Last data filed at 03/13/20 0700 Gross per 24 hour  Intake 2711.95 ml  Output 2971 ml  Net -259.05 ml     Physical Exam: General:  Lying in bed. Sedated HEENT: normal Neck: supple. ECMO cannula on R. Trialysis on left Carotids 2+ bilat; no bruits. No lymphadenopathy or thryomegaly appreciated. Cor: PMI nondisplaced. Regular rate & rhythm. No rubs, gallops or murmurs. Lungs: decreased throguhout Abdomen: obese soft, nontender,  nondistended. No hepatosplenomegaly. No bruits or masses. Good bowel sounds. + PEG Extremities: no cyanosis, clubbing, rash, tr edema Neuro: sedated   Telemetry: Sinus 60-70s Personally reviewed    Labs: Basic Metabolic Panel: Recent Labs  Lab 02/29/20 0411 02/29/20 0423 03/01/20 0427 03/01/20 0803 03/02/20 0222 03/02/20 0223 03/02/20 1526 03/02/20 1728 03/03/20 0330 03/03/20 0739 03/03/20 1513 03/03/20 1513 03/03/20 1516 03/03/20 1958 03/03/20 2314 2020-03-13 0357 March 13, 2020 0418  NA 134*   < > 132*   < > 136   < > 133*   < > 134*   < > 134*   < > 137 137 137 133* 135  K 4.5   < > 4.4   < > 3.5   < > 3.3*   < > 3.3*   < > 3.4*   < > 3.6 3.5 3.5 3.3* 3.3*  CL 99   < > 96*   < > 99  --  97*  --  98  --  99  --   --   --   --  99  --   CO2 25   < > 24   < > 24  --  23  --  23  --  22  --   --   --   --  22  --   GLUCOSE 174*   < > 179*   < > 207*  --  220*  --  222*  --  202*  --   --   --   --  227*  --   BUN 37*   < > 41*   < > 44*  --  43*  --  44*  --  42*  --   --   --   --  42*  --   CREATININE 1.03*   < > 1.04*   < > 1.02*  --  0.91  --  0.87  --  0.95  --   --   --   --  0.91  --   CALCIUM 8.8*   < > 8.8*   < > 9.1   < > 9.2   < > 9.4  --  8.9  --   --   --   --  9.3  --   MG 2.8*  --  2.8*  --  2.7*  --   --   --  2.7*  --   --   --   --   --   --  2.8*  --   PHOS 2.0*   < > 3.2   < > 2.5  --  3.5  --  2.7  --  3.0  --   --   --   --  2.3*  --    < > = values in this interval not displayed.    Liver Function Tests: Recent Labs  Lab 03/02/20 0222 03/02/20 1526 03/03/20 0330 03/03/20 1513 03/20/20 0357  ALBUMIN 3.3* 3.4* 3.3* 3.0* 3.3*   No results for input(s): LIPASE, AMYLASE in the last 168 hours. No results for input(s): AMMONIA in the last 168 hours.  CBC: Recent Labs  Lab 02/28/20 1753 02/28/20 1952 03/02/20 0222 03/02/20 0223 03/02/20 1526 03/02/20 1728 03/03/20 0330 03/03/20 0739 03/03/20 1513 03/03/20 1513 03/03/20 1516 03/03/20 1958  03/03/20 2314 2020-03-20 0357 March 20, 2020 0418  WBC 12.0*   < > 7.3  --  7.6  --  7.2  --  8.1  --   --   --   --  7.0  --   NEUTROABS 9.5*  --   --   --   --   --   --   --   --   --   --   --   --   --   --   HGB 8.8*   < > 9.1*   < > 9.4*   < > 9.3*   < > 9.1*   < > 9.9* 10.2* 9.5* 8.8* 9.2*  HCT 28.9*   < > 29.0*   < > 30.0*   < > 30.1*   < > 29.5*   < > 29.0* 30.0* 28.0* 28.2* 27.0*  MCV 102.5*   < > 98.6  --  99.3  --  97.1  --  97.7  --   --   --   --  97.9  --   PLT 113*   < > 99*  --  101*  --  104*  --  107*  --   --   --   --  108*  --    < > = values in this interval not displayed.    Cardiac Enzymes: No results for input(s): CKTOTAL, CKMB, CKMBINDEX, TROPONINI in the last 168 hours.  BNP: BNP (last 3 results) No results for input(s): BNP in the last 8760 hours.  ProBNP (last 3 results) No  results for input(s): PROBNP in the last 8760 hours.    Other results:  Imaging: CT CHEST WO CONTRAST  Result Date: 03/03/2020 CLINICAL DATA:  ARDS, known COVID-19 EXAM: CT CHEST WITHOUT CONTRAST TECHNIQUE: Multidetector CT imaging of the chest was performed following the standard protocol without IV contrast. COMPARISON:  Plain film from earlier in the same day as well as prior CT from 01/19/2020 FINDINGS: Cardiovascular: Thoracic aorta demonstrates no aneurysmal dilatation. No significant cardiac enlargement is noted. ECMO cannula is noted extending from the right jugular vein inferiorly to the IVC. Right-sided PICC line is identified extending into the superior vena cava. Left jugular temporary dialysis catheter is noted. These are all stable from the recent chest x-ray. Mediastinum/Nodes: Thoracic inlet is within normal limits. Tracheostomy tube is noted. No sizable hilar or mediastinal adenopathy is noted. The esophagus is within normal limits. Lungs/Pleura: Small right-sided pleural effusion is noted. Diffuse airspace opacities are again identified similar to that seen on recent chest  x-ray but slightly improved when compared with the prior CT examination from 1 month ago. These changes are consistent with sequelae from prior COVID-19 infection. Small right pneumothorax is noted anteriorly. Upper Abdomen: Visualized upper abdomen shows no acute abnormality. Musculoskeletal: Degenerative changes of the thoracic spine are noted. IMPRESSION: Persistent bilateral airspace opacities similar to that seen on recent chest x-ray but significantly improved when compared with the prior CT from 01/19/2020. Small right-sided pleural effusion is noted. Tiny right anterior pneumothorax likely chronic in nature. Tubes and lines as described above. Electronically Signed   By: Inez Catalina M.D.   On: 03/03/2020 10:32   DG CHEST PORT 1 VIEW  Result Date: 03/24/2020 CLINICAL DATA:  Tracheostomy.  ARDS.  ECMO. EXAM: PORTABLE CHEST 1 VIEW COMPARISON:  CT 03/03/2020.  Chest x-ray 03/03/2020. FINDINGS: Tracheostomy tube, left IJ line, right PICC line, ECMO device in stable position. Stable cardiomegaly. Diffuse dense bilateral pulmonary infiltrates again noted. Right pleural effusion again noted. Previously identified tiny right anterior pneumothorax not identified by chest x-ray. Prior cervical spine fusion. IMPRESSION: 1. Lines and tubes in stable position. 2. Stable cardiomegaly. 3. Diffuse dense bilateral pulmonary infiltrates again noted without interim change. Right pleural effusion again noted. 4. Previously identified tiny right anterior pneumothorax not identified by chest x-ray. Electronically Signed   By: Marcello Moores  Register   On: 24-Mar-2020 06:25   DG CHEST PORT 1 VIEW  Result Date: 03/03/2020 CLINICAL DATA:  History of COVID-19 positivity on ECMO EXAM: PORTABLE CHEST 1 VIEW COMPARISON:  03/02/2020 FINDINGS: Cardiac shadow is stable but obscured by the lung opacities. Diffuse airspace opacities are noted similar to that seen on the prior exam. Right-sided PICC line, ECMO cannula and left jugular  temporary dialysis catheter are seen and stable. Tracheostomy tube is noted in satisfactory position. No bony abnormality is noted. IMPRESSION: Overall stable appearance when compared with the prior day. Electronically Signed   By: Inez Catalina M.D.   On: 03/03/2020 10:33     Medications:     Scheduled Medications: . amiodarone  200 mg Per Tube Daily  . vitamin C  500 mg Per Tube Daily  . chlorhexidine gluconate (MEDLINE KIT)  15 mL Mouth Rinse BID  . Chlorhexidine Gluconate Cloth  6 each Topical Q0600  . clonazePAM  0.5 mg Per Tube Q6H  . collagenase   Topical Daily  . docusate  100 mg Per Tube BID  . feeding supplement (PROSource TF)  45 mL Per Tube QID  . Gerhardt's butt cream  Topical BID  . insulin aspart  0-15 Units Subcutaneous Q4H  . insulin aspart  15 Units Subcutaneous Q4H  . insulin detemir  60 Units Subcutaneous BID  . linagliptin  5 mg Per Tube Daily  . mouth rinse  15 mL Mouth Rinse 10 times per day  . melatonin  9 mg Per Tube QHS  . metoCLOPramide (REGLAN) injection  5 mg Intravenous Q8H  . midodrine  10 mg Per Tube TID  . multivitamin  1 tablet Per Tube QHS  . naLOXone (NARCAN)  injection  1 mg Intravenous Once  . pantoprazole (PROTONIX) IV  40 mg Intravenous BID  . pneumococcal 23 valent vaccine  0.5 mL Intramuscular Tomorrow-1000  . polyethylene glycol  17 g Per Tube Daily  . QUEtiapine  100 mg Per Tube QHS  . sennosides  5 mL Per Tube BID  . zinc sulfate  220 mg Per Tube Daily    Infusions: .  prismasol BGK 4/2.5 400 mL/hr at 03/03/20 2323  .  prismasol BGK 4/2.5 200 mL/hr at 03/03/20 2323  . sodium chloride 250 mL (03/01/20 0844)  . sodium chloride 10 mL/hr at 03/30/2020 0700  . sodium chloride Stopped (02/24/20 2312)  . sodium chloride    . albumin human 12.5 g (03/03/20 2248)  . ceFEPime (MAXIPIME) IV Stopped (03-30-20 0603)  . feeding supplement (PIVOT 1.5 CAL) 1,000 mL (03/30/2020 0447)  . heparin 10,000 units/ 20 mL infusion syringe 500 Units/hr  (03-30-20 0402)  . heparin 1,200 Units/hr (03-30-2020 0700)  . prismasol BGK 4/2.5    . vancomycin Stopped (03/03/20 1203)    PRN Medications: sodium chloride, sodium chloride, sodium chloride, sodium chloride, acetaminophen (TYLENOL) oral liquid 160 mg/5 mL, albumin human, albuterol, alteplase, alum & mag hydroxide-simeth, dextrose, guaiFENesin-dextromethorphan, heparin, heparin, heparin, hydrALAZINE, HYDROmorphone (DILAUDID) injection, hydrOXYzine, lidocaine (PF), lidocaine, lidocaine-prilocaine, naLOXone (NARCAN)  injection, ondansetron **OR** ondansetron (ZOFRAN) IV, oxyCODONE, pentafluoroprop-tetrafluoroeth, silver nitrate applicators, Zinc Oxide   Assessment/Plan:   1. Acute hypoxic/hypercapneic respiratory failure in setting of severe COVID PNA/ARDS -> VV ECMO - admit 8/2 - intubation 8/3 - has received actmera (compelted 8/2), remdesivir (completed 8/6) and steroids - Cannulated for VV ECMO on 8/9 - Extubated 8/16. Reintubated 8/24 - s/p trach 8/30. - Circuit changed 8/24 due to concern for hemolysis and worsening oxygenation. Repeat circuit change 9/17 - CVVHD started 8/22 for volume removal and uremia. Remains anuric. CXR worse on 9/9 so switched back to CVVHD. Keep even to -50/hr - Off IV sedation. Continue melatonin and seroquel for sleep at night. - Stable off pressors. On midodrine 10 tid. Has wide pulse pressure but no AI on echo.  - Lung function returning very slowly. I suspect major issue remains respiratory muscle weakness and poor lung compliance but still with diffuse infiltrates suggestive of fibrosis. Continue to wean sweep. We tried to optimize vent recently  to wean ECMO further but it didn't work. Chest CT 9/26 is improved but still with dense fibrotic changes which I doubt will get much better. Has high dead space. In order to get off ECMO will need to improve respiratory muscle strength. Avoid sedatives. Still with multiple comorbidities including large sacral wound,  renal failure and diffuse muscle weakness. If can wean off ECMO will have protracted LTAC stay. Family meeting today to update. - continue abx for pseudomonas/serratia in sputum - Continue heparin with level 0.2-0.3 Discussed dosing with PharmD personally.  2. Enterococcus sepsis - Finished high-dose zosyn on 9/7 per ID - Eraxis added on  8/26 with yeast in BAL 8/24 (ended 9/2)  - F/u cx 9/7 No growth  3. Intracranial hemorrhage - ? Septic emboli - repeat head CT on 8/14 with stable bleeds but increased edema - neurology has seen. Suspect significant long-term injury sustained. Will follow commands. Appears to have dense LUE weakness and possibly LLE - repeat head CT stable 8/16 - tolerating heparin. No change. Heparin level 0.19  4. Thrombocytopenia - PLTs stable around 90-110k - Continue to follow.   5. Morbid obesity - Body mass index is Body mass index is 42.61 kg/m.  6. Poorly controlled DM2 - HgBA1c 10.7 - IV insulin converted to SQ insulin on 9/15  7. PAF - intermittent episodes. Last 8/26 - Now on po. Quiescent   8. AKI/azotemia - CVVHD started on 8/22 - Down 70 pounds.  - transitioned to [facility] on 9/7 - switched back to CVVHD for better volume removal on 9/9. - new catheter placed 9/9 - will eventually need tunneled access - Volume status ok. Keep CVVHD -50 - D/w Renal at bedside  9. Sacral decub  - large unstageable. Seen by [facility]. Not candidate for debridement at this point.  - for hydrotherapy day #5 today. Cx + pseudomonas & serratia - abx as above  10. Necrotic fingers on R - VVS following - suspect will auto amputate  11. Anemia - Transfuse hgb < 8   CRITICAL CARE Performed by: Glori Bickers  Total critical care time: 45 minutes  Critical care time was exclusive of separately billable procedures and treating other patients.  Critical care was necessary to treat or prevent imminent or life-threatening deterioration.  Critical care was time  spent personally by me (independent of midlevel providers or residents) on the following activities: development of treatment plan with patient and/or surrogate as well as nursing, discussions with consultants, evaluation of patient's response to treatment, examination of patient, obtaining history from patient or surrogate, ordering and performing treatments and interventions, ordering and review of laboratory studies, ordering and review of radiographic studies, pulse oximetry and re-evaluation of patient's condition.   Length of Stay: [place]  MD 16-Mar-2020, 9:21 AM  Advanced Heart Failure Team Pager [phone] (M-F; [place])  Please contact [facility] for night-coverage after hours (4p -7a ) and weekends on [email or web address]

## 2020-03-08 NOTE — Progress Notes (Signed)
Patient ID: Alisha Stephenson, female   DOB: 24-Mar-1966, 54 y.o.   MRN: [id number] [facility] Progress Note   Assessment/ Plan:   1. Acute kidney Injury: Anuric.  Secondary to ischemic ATN from septic shock associated with COVID-19 pneumonia.  She remains on CRRT after transient attempt to transition to intermittent hemodialysis unsuccessful due to volume overload.  She is on venovenous ECMO.  Continue current prescription of CRRT with goal to keep her net even from fluid status. 2.  Acute hypoxic/hypercarbic respiratory failure in the setting of severe COVID-19 pneumonia/ARDS: On venovenous ECMO 3.  Hyponatremia: Secondary to acute kidney injury/impaired free water handling and concomitant hyperglycemia.  Monitor on CRRT. 4.  Hypokalemia: Diarrhea appears to be resolving, switch to 4K dialysate. 5.  Enterococcal sepsis.  Status post completion of high-dose Zosyn. 6.  Sacral decubitus ulcer: Surgical debridement deferred at this time with recommendations for ongoing wound care. 7.  Anemia of critical illness: Hemoglobin and hematocrit trending up, PRBC transfusion triggers per CCM.  Subjective:   Without acute events noted overnight.  Ongoing discussions with palliative care service.   Objective:   BP (!) 105/56   Pulse 65   Temp 98.4 F (36.9 C) (Oral)   Resp (!) 24   Ht 5' 4"  (1.626 m)   Wt 112.6 kg   SpO2 98%   BMI 42.61 kg/m   Intake/Output Summary (Last 24 hours) at 15-Mar-2020 0836 Last data filed at 2020-03-15 0700 Gross per 24 hour  Intake 2734.94 ml  Output 2992 ml  Net -257.06 ml   Weight change:   Physical Exam: Gen: Appears comfortable on ventilator via tracheostomy, husband at bedside. CVS: Pulse regular rhythm, normal rate, S1 and S2 normal Resp: Anteriorly clear to auscultation, no rales/rhonchi Abd: Soft, obese, nontender Ext: No lower extremity edema.  Necrotic index/middle finger noted right hand.  Imaging: CT CHEST WO CONTRAST  Result Date:  03/03/2020 CLINICAL DATA:  ARDS, known COVID-19 EXAM: CT CHEST WITHOUT CONTRAST TECHNIQUE: Multidetector CT imaging of the chest was performed following the standard protocol without IV contrast. COMPARISON:  Plain film from earlier in the same day as well as prior CT from 01/19/2020 FINDINGS: Cardiovascular: Thoracic aorta demonstrates no aneurysmal dilatation. No significant cardiac enlargement is noted. ECMO cannula is noted extending from the right jugular vein inferiorly to the IVC. Right-sided PICC line is identified extending into the superior vena cava. Left jugular temporary dialysis catheter is noted. These are all stable from the recent chest x-ray. Mediastinum/Nodes: Thoracic inlet is within normal limits. Tracheostomy tube is noted. No sizable hilar or mediastinal adenopathy is noted. The esophagus is within normal limits. Lungs/Pleura: Small right-sided pleural effusion is noted. Diffuse airspace opacities are again identified similar to that seen on recent chest x-ray but slightly improved when compared with the prior CT examination from 1 month ago. These changes are consistent with sequelae from prior COVID-19 infection. Small right pneumothorax is noted anteriorly. Upper Abdomen: Visualized upper abdomen shows no acute abnormality. Musculoskeletal: Degenerative changes of the thoracic spine are noted. IMPRESSION: Persistent bilateral airspace opacities similar to that seen on recent chest x-ray but significantly improved when compared with the prior CT from 01/19/2020. Small right-sided pleural effusion is noted. Tiny right anterior pneumothorax likely chronic in nature. Tubes and lines as described above. Electronically Signed   By: Inez Catalina M.D.   On: 03/03/2020 10:32   DG CHEST PORT 1 VIEW  Result Date: 2020-03-15 CLINICAL DATA:  Tracheostomy.  ARDS.  ECMO.  EXAM: PORTABLE CHEST 1 VIEW COMPARISON:  CT 03/03/2020.  Chest x-ray 03/03/2020. FINDINGS: Tracheostomy tube, left IJ line, right  PICC line, ECMO device in stable position. Stable cardiomegaly. Diffuse dense bilateral pulmonary infiltrates again noted. Right pleural effusion again noted. Previously identified tiny right anterior pneumothorax not identified by chest x-ray. Prior cervical spine fusion. IMPRESSION: 1. Lines and tubes in stable position. 2. Stable cardiomegaly. 3. Diffuse dense bilateral pulmonary infiltrates again noted without interim change. Right pleural effusion again noted. 4. Previously identified tiny right anterior pneumothorax not identified by chest x-ray. Electronically Signed   By: Marcello Moores  Register   On: 2020/04/01 06:25   DG CHEST PORT 1 VIEW  Result Date: 03/03/2020 CLINICAL DATA:  History of COVID-19 positivity on ECMO EXAM: PORTABLE CHEST 1 VIEW COMPARISON:  03/02/2020 FINDINGS: Cardiac shadow is stable but obscured by the lung opacities. Diffuse airspace opacities are noted similar to that seen on the prior exam. Right-sided PICC line, ECMO cannula and left jugular temporary dialysis catheter are seen and stable. Tracheostomy tube is noted in satisfactory position. No bony abnormality is noted. IMPRESSION: Overall stable appearance when compared with the prior day. Electronically Signed   By: Inez Catalina M.D.   On: 03/03/2020 10:33    Labs: BMET Recent Labs  Lab 03/01/20 0427 03/01/20 0803 03/01/20 1539 03/01/20 1946 03/02/20 0222 03/02/20 0223 03/02/20 1526 03/02/20 1728 03/03/20 0330 03/03/20 0739 03/03/20 1108 03/03/20 1513 03/03/20 1516 03/03/20 1958 03/03/20 2314 01-Apr-2020 0357 04/01/20 0418  NA 132*   < > 133*   < > 136   < > 133*   < > 134*   < > 137 134* 137 137 137 133* 135  K 4.4   < > 4.4   < > 3.5   < > 3.3*   < > 3.3*   < > 3.5 3.4* 3.6 3.5 3.5 3.3* 3.3*  CL 96*  --  99  --  99  --  97*  --  98  --   --  99  --   --   --  99  --   CO2 24  --  23  --  24  --  23  --  23  --   --  22  --   --   --  22  --   GLUCOSE 179*  --  220*  --  207*  --  220*  --  222*  --   --   202*  --   --   --  227*  --   BUN 41*  --  42*  --  44*  --  43*  --  44*  --   --  42*  --   --   --  42*  --   CREATININE 1.04*  --  1.08*  --  1.02*  --  0.91  --  0.87  --   --  0.95  --   --   --  0.91  --   CALCIUM 8.8*  --  8.8*  --  9.1  --  9.2  --  9.4  --   --  8.9  --   --   --  9.3  --   PHOS 3.2  --  2.9  --  2.5  --  3.5  --  2.7  --   --  3.0  --   --   --  2.3*  --    < > =  values in this interval not displayed.   CBC Recent Labs  Lab 02/28/20 1753 02/28/20 1952 03/02/20 1526 03/02/20 1728 03/03/20 0330 03/03/20 0739 03/03/20 1513 03/03/20 1516 03/03/20 1958 03/03/20 2314 2020/03/18 0357 18-Mar-2020 0418  WBC 12.0*   < > 7.6  --  7.2  --  8.1  --   --   --  7.0  --   NEUTROABS 9.5*  --   --   --   --   --   --   --   --   --   --   --   HGB 8.8*   < > 9.4*   < > 9.3*   < > 9.1*   < > 10.2* 9.5* 8.8* 9.2*  HCT 28.9*   < > 30.0*   < > 30.1*   < > 29.5*   < > 30.0* 28.0* 28.2* 27.0*  MCV 102.5*   < > 99.3  --  97.1  --  97.7  --   --   --  97.9  --   PLT 113*   < > 101*  --  104*  --  107*  --   --   --  108*  --    < > = values in this interval not displayed.    Medications:    . amiodarone  200 mg Per Tube Daily  . vitamin C  500 mg Per Tube Daily  . chlorhexidine gluconate (MEDLINE KIT)  15 mL Mouth Rinse BID  . Chlorhexidine Gluconate Cloth  6 each Topical Q0600  . clonazePAM  1 mg Per Tube Q6H  . collagenase   Topical Daily  . docusate  100 mg Per Tube BID  . feeding supplement (PROSource TF)  45 mL Per Tube QID  . Gerhardt's butt cream   Topical BID  . insulin aspart  0-15 Units Subcutaneous Q4H  . insulin aspart  15 Units Subcutaneous Q4H  . insulin detemir  60 Units Subcutaneous BID  . linagliptin  5 mg Per Tube Daily  . mouth rinse  15 mL Mouth Rinse 10 times per day  . melatonin  9 mg Per Tube QHS  . metoCLOPramide (REGLAN) injection  5 mg Intravenous Q8H  . midodrine  10 mg Per Tube TID  . multivitamin  1 tablet Per Tube QHS  . naloxone      .  pantoprazole (PROTONIX) IV  40 mg Intravenous BID  . pneumococcal 23 valent vaccine  0.5 mL Intramuscular Tomorrow-1000  . polyethylene glycol  17 g Per Tube Daily  . QUEtiapine  100 mg Per Tube QHS  . sennosides  5 mL Per Tube BID  . zinc sulfate  220 mg Per Tube Daily   Elmarie Shiley, MD 03-18-2020, 8:36 AM

## 2020-03-08 DEATH — deceased

## 2021-08-28 IMAGING — MR MR HEAD W/O CM
10 series · 48 of 48 positions shown · non-contrast
Comparison: Head CT 01/06/2020

CLINICAL DATA: Subarachnoid hemorrhage suspected. Additional
provided: Subarachnoid hemorrhage suspected, COVID positive with
headaches for 9 days.

EXAM:
MRI HEAD WITHOUT CONTRAST
MRA HEAD WITHOUT CONTRAST
TECHNIQUE: Multiplanar, multiecho pulse sequences of the brain and surrounding
structures were obtained without intravenous contrast. Angiographic
images of the head were obtained using MRA technique without
contrast.

[Series 1: T1 · sagittal · 5.0mm · 0.98mm/px · 3 of 27 slices shown]
[im 1/27]
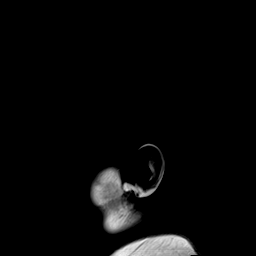
[im 14/27]
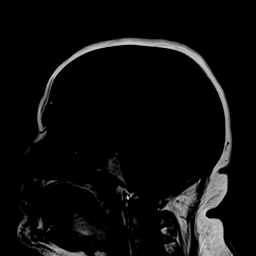
[im 27/27]
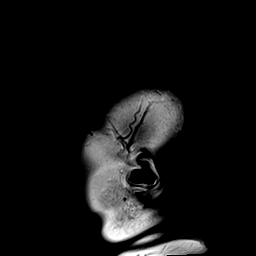

[Series 2: DWI · axial · 3.0mm · 1.88mm/px · z∈[-75,+79]mm · 9 of 94 slices shown (1 of 4)]
[im 1/94]
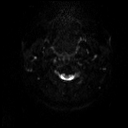
[im 12/94]
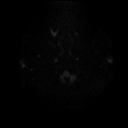
[im 24/94]
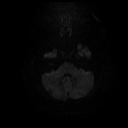
[im 35/94]
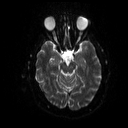
[im 47/94]
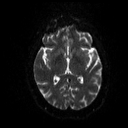
[im 59/94]
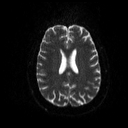
[im 70/94]
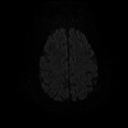
[im 82/94]
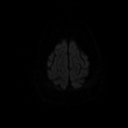
[im 94/94]
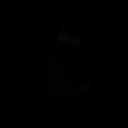

[Series 3: DWI · axial · 3.0mm · 1.88mm/px · z∈[-75,+79]mm · 4 of 48 slices shown (2 of 4)]
[im 1/48]
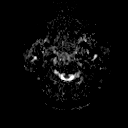
[im 16/48]
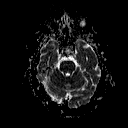
[im 32/48]
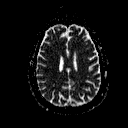
[im 48/48]
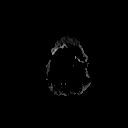

[Series 4: DWI · coronal · 5.0mm · 1.46mm/px · 5 of 60 slices shown (3 of 4)]
[im 1/60]
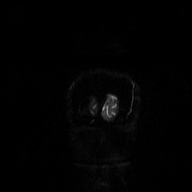
[im 15/60]
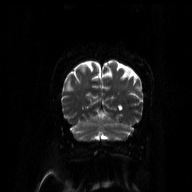
[im 30/60]
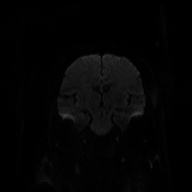
[im 45/60]
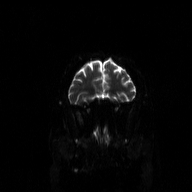
[im 60/60]
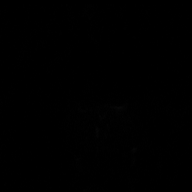

[Series 5: DWI · coronal · 5.0mm · 1.46mm/px · 3 of 30 slices shown (4 of 4)]
[im 1/30]
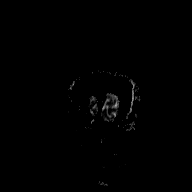
[im 15/30]
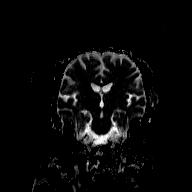
[im 30/30]
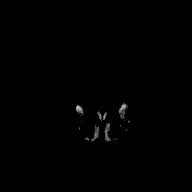

[Series 6: T2 · axial · 5.0mm · 0.69mm/px · z∈[-79,+82]mm · 2 of 28 slices shown (1 of 3)]
[im 1/28]
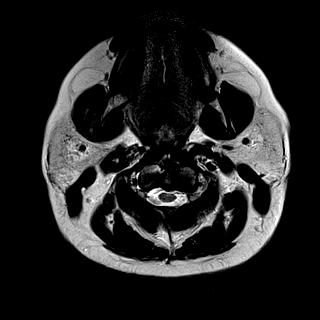
[im 28/28]
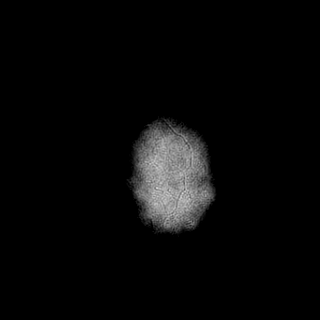

[Series 7: FLAIR · axial · 3.0mm · 0.43mm/px · z∈[-80,+83]mm · 4 of 42 slices shown]
[im 1/42]
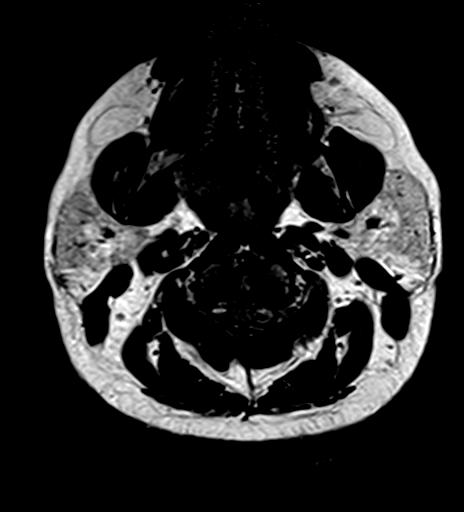
[im 14/42]
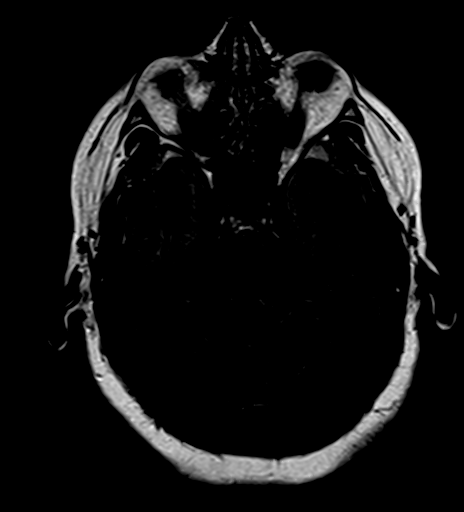
[im 28/42]
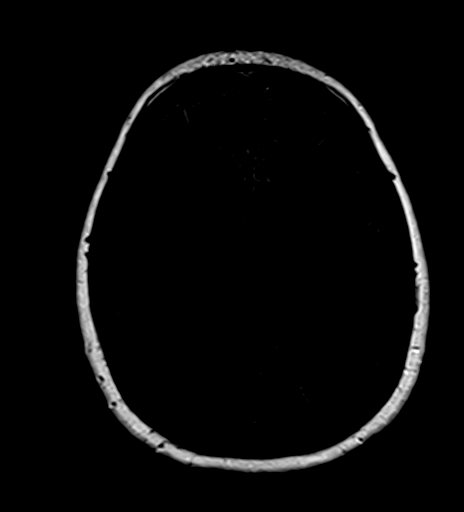
[im 42/42]
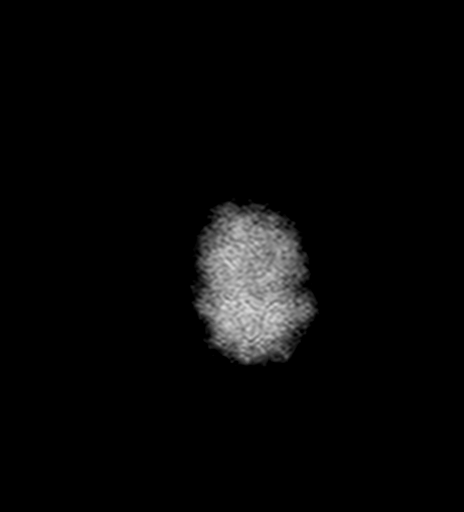

[Series 8: T2 · axial · 5.0mm · 0.43mm/px · z∈[-79,+82]mm · 2 of 28 slices shown (2 of 3)]
[im 1/28]
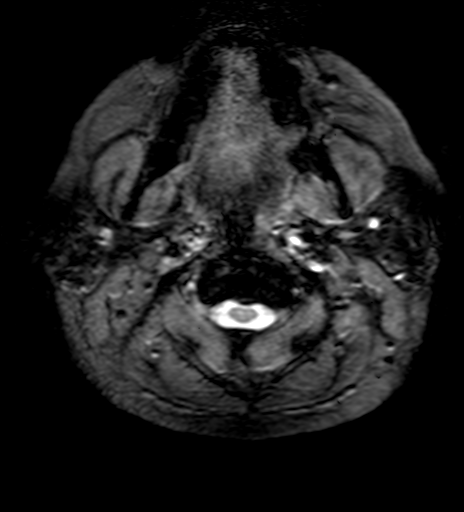
[im 28/28]
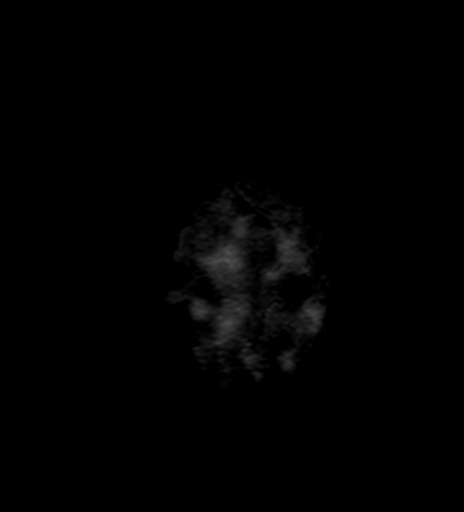

[Series 9: t1_3d_tra · axial · 1.0mm · 0.94mm/px · z∈[-77,+81]mm · 14 of 160 slices shown]
[im 1/160]
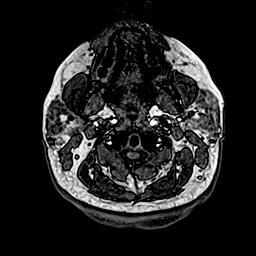
[im 13/160]
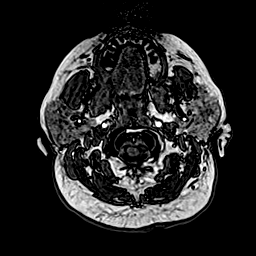
[im 25/160]
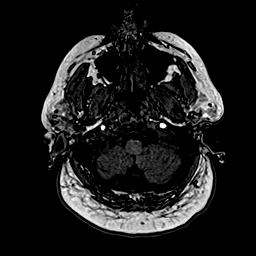
[im 37/160]
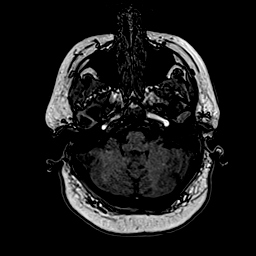
[im 49/160]
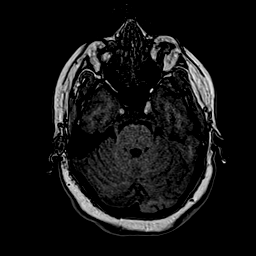
[im 62/160]
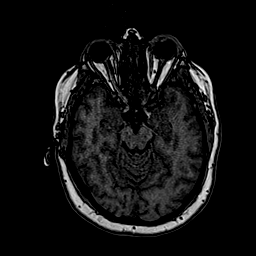
[im 74/160]
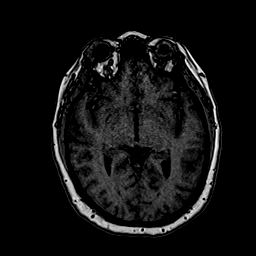
[im 86/160]
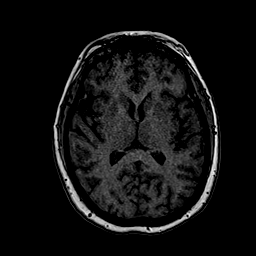
[im 98/160]
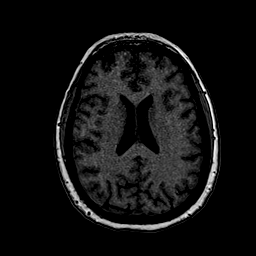
[im 111/160]
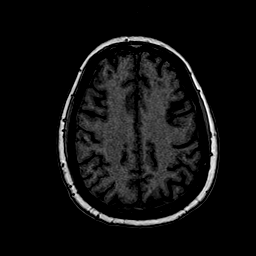
[im 123/160]
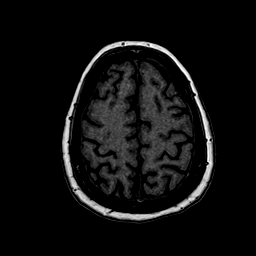
[im 135/160]
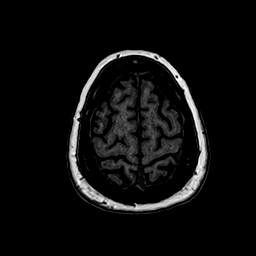
[im 147/160]
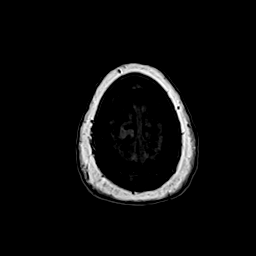
[im 160/160]
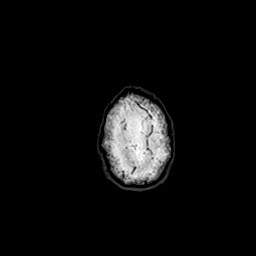

[Series 10: T2 · coronal · 5.0mm · 0.72mm/px · 2 of 26 slices shown (3 of 3)]
[im 1/26]
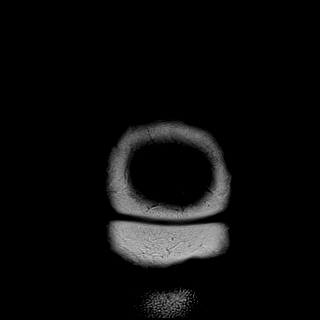
[im 26/26]
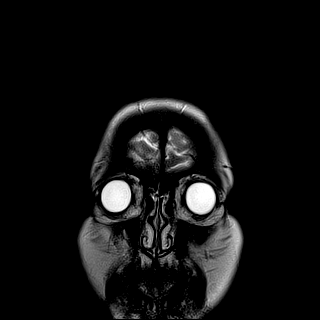

[48 of 48 positions shown; findings below may reference images not displayed]

FINDINGS: MRI HEAD FINDINGS

Brain:

Cerebral volume is normal.

Minimal scattered T2/FLAIR hyperintensity within the cerebral white
matter is nonspecific, but consistent with chronic small vessel
ischemic disease.

There is no acute infarct.

No evidence of intracranial mass.

No chronic intracranial blood products.

No extra-axial fluid collection.

No midline shift.

Vascular: Reported below.

Skull and upper cervical spine: No focal marrow lesion.

Sinuses/Orbits: Visualized orbits show no acute finding. Mild
ethmoid sinus mucosal thickening. Small right maxillary sinus mucous
retention cyst. No significant mastoid effusion

MRA HEAD FINDINGS

The intracranial internal carotid arteries are patent.

The M1 middle cerebral arteries are patent without significant
stenosis. No M2 proximal branch occlusion or high-grade proximal
stenosis is identified.

Developmentally absent A1 right anterior cerebral artery. The A1
left ACA and more distal anterior cerebral arteries are patent.

There is a slightly bulbous appearance of the anterior communicating
artery measuring approximately 1 mm (series 103, image 6). This may
reflect a tiny aneurysm.

The non dominant intracranial left vertebral artery is
developmentally diminutive, but patent. The dominant cranial right
vertebral artery is patent without significant stenosis. The basilar
artery is developmentally diminutive, but patent without significant
stenosis. The posterior cerebral arteries are patent proximally
without significant stenosis. The posterior cerebral arteries are
predominantly fetal in origin bilaterally.
IMPRESSION: MRI brain:

1. No evidence of acute intracranial abnormality.
2. Mild chronic small vessel ischemic changes within the cerebral
white matter

MRA head:

1. No intracranial large vessel occlusion or proximal high-grade
arterial stenosis.
2. Developmentally absent A1 right anterior cerebral artery.
3. There is a slightly bulbous appearance of the anterior
communicating artery measuring 1 mm in diameter. This may reflect a
tiny aneurysm.
4. The intracranial vertebral and basilar arteries are
developmentally diminutive in the setting of predominantly fetal
origin posterior cerebral arteries.

## 2021-08-28 IMAGING — CT CT HEAD W/O CM
3 series · 15 of 47 positions shown, 18 images · non-contrast
Comparison: None.

CLINICAL DATA: Headache

EXAM:
CT HEAD WITHOUT CONTRAST
TECHNIQUE: Contiguous axial images were obtained from the base of the skull
through the vertex without intravenous contrast.

[Series 2: head wo · axial · 0.46mm/px · z∈[-147,-22]mm · 9 of 31 slices shown, 12 images]
[im 3/31  brain]
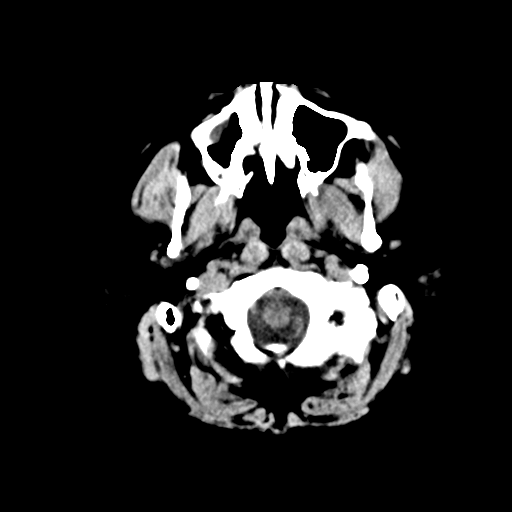
[im 3/31  bone]
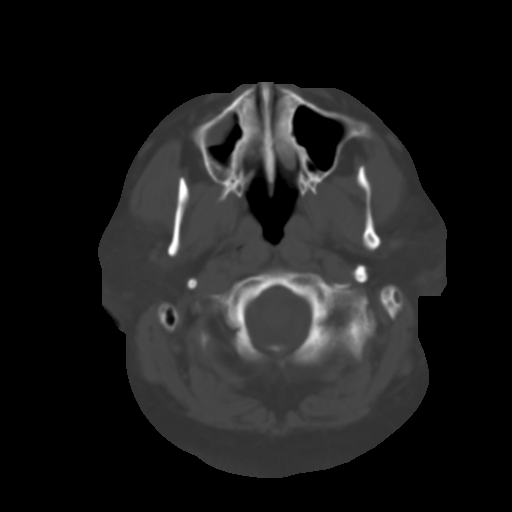
[im 6/31  brain]
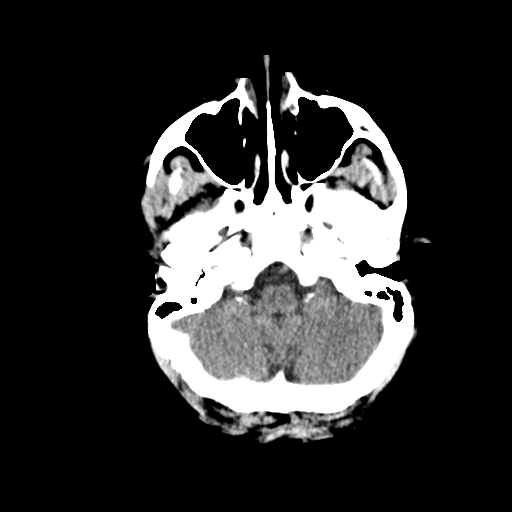
[im 9/31  brain]
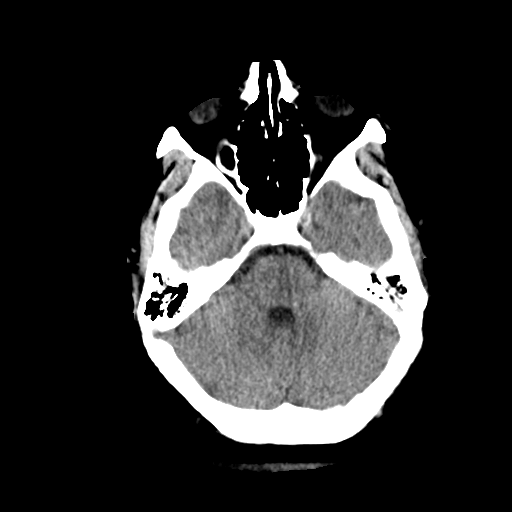
[im 12/31  brain]
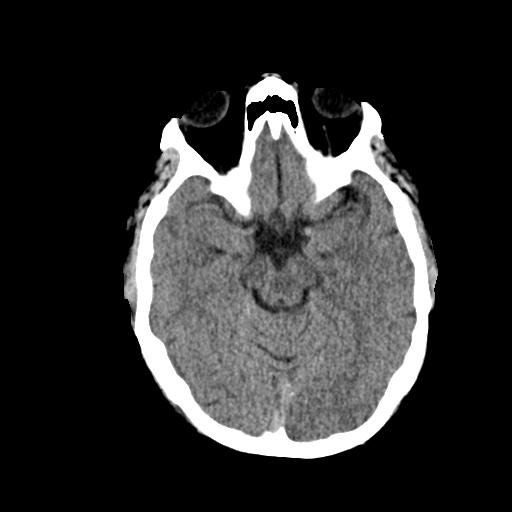
[im 16/31  brain]
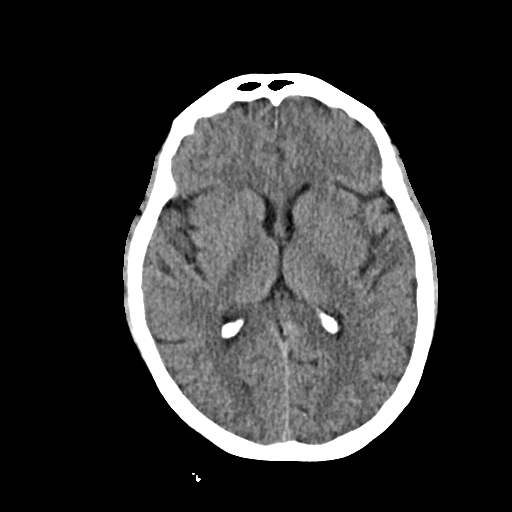
[im 16/31  bone]
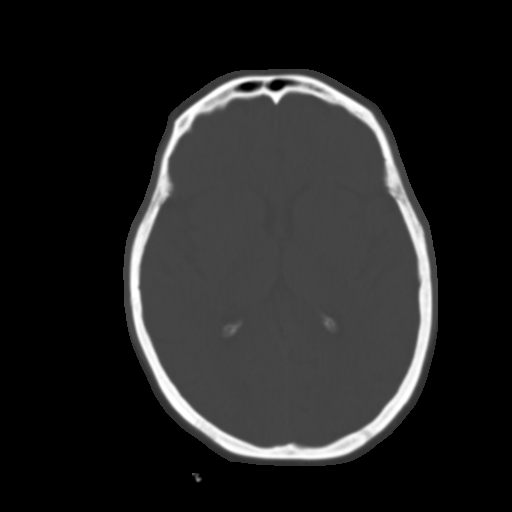
[im 19/31  brain]
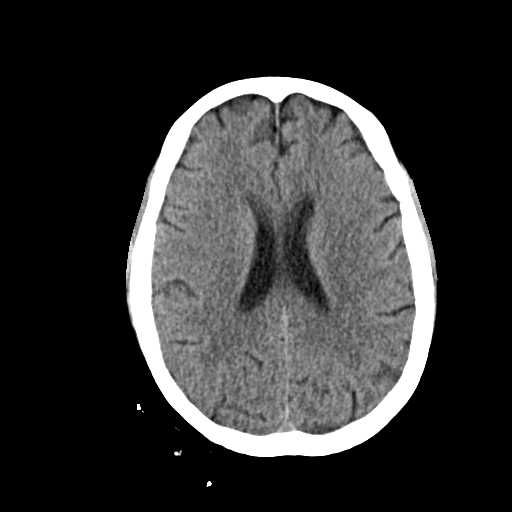
[im 22/31  brain]
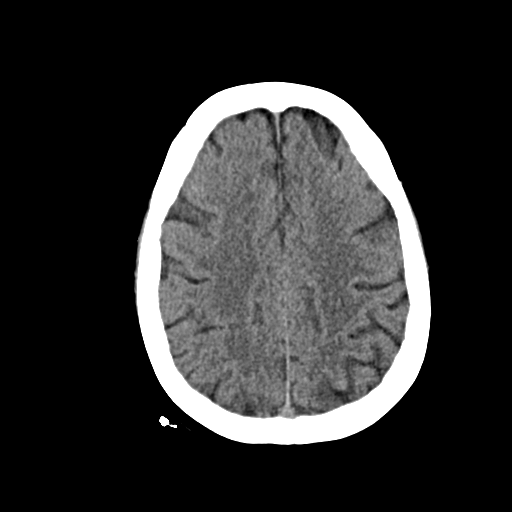
[im 25/31  brain]
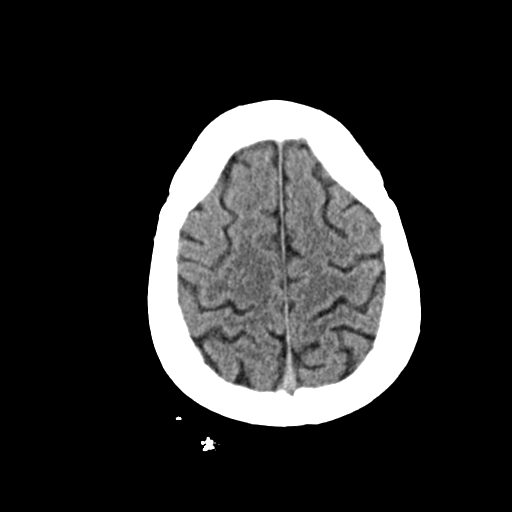
[im 28/31  brain]
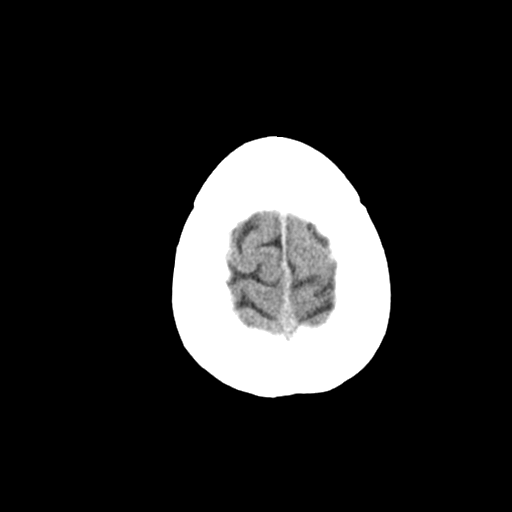
[im 28/31  bone]
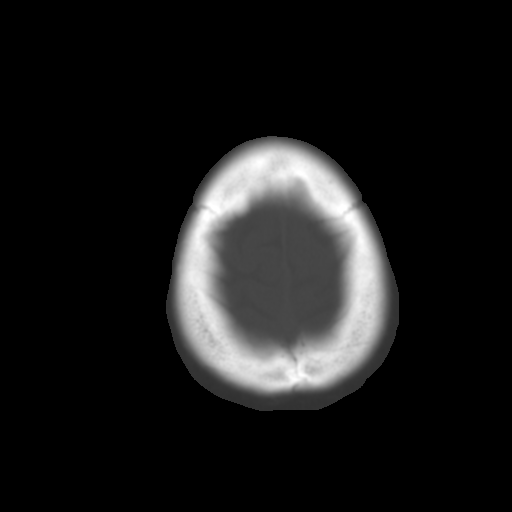

[Series 4: coronal soft · coronal · 0.30mm/px · 3 of 67 slices shown]
[im 23/67  brain]
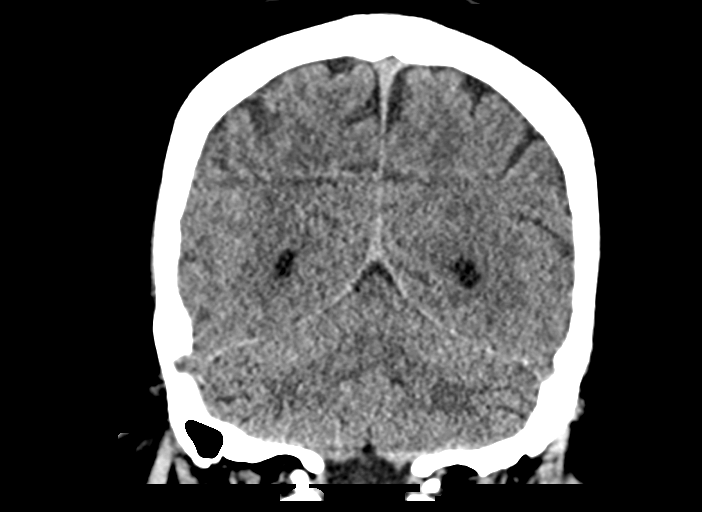
[im 30/67  brain]
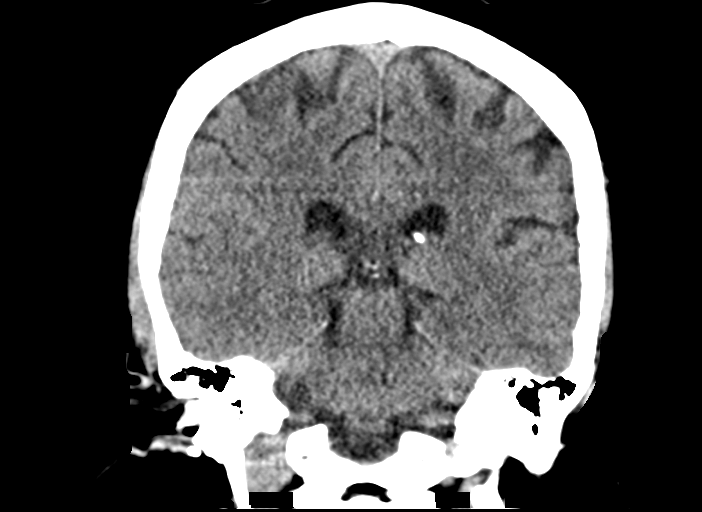
[im 37/67  brain]
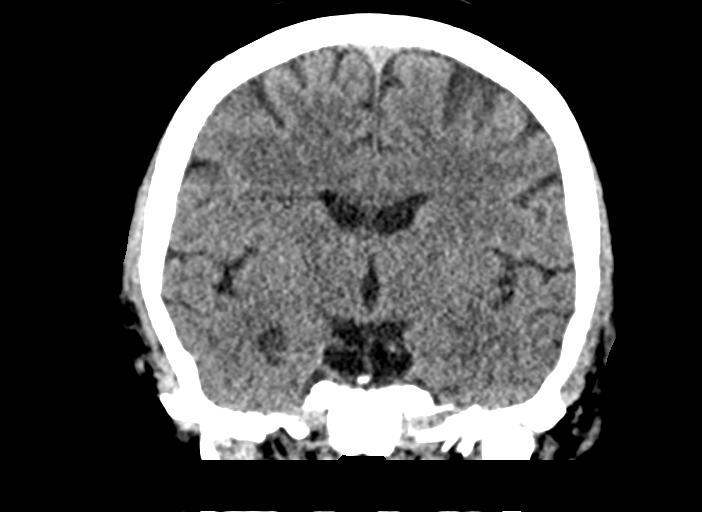

[Series 5: sag soft · sagittal · 0.30mm/px · 3 of 71 slices shown]
[im 24/71  brain]
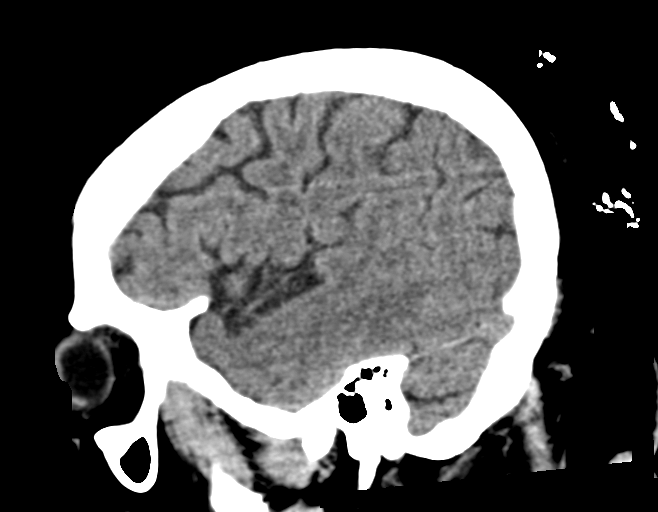
[im 36/71  brain]
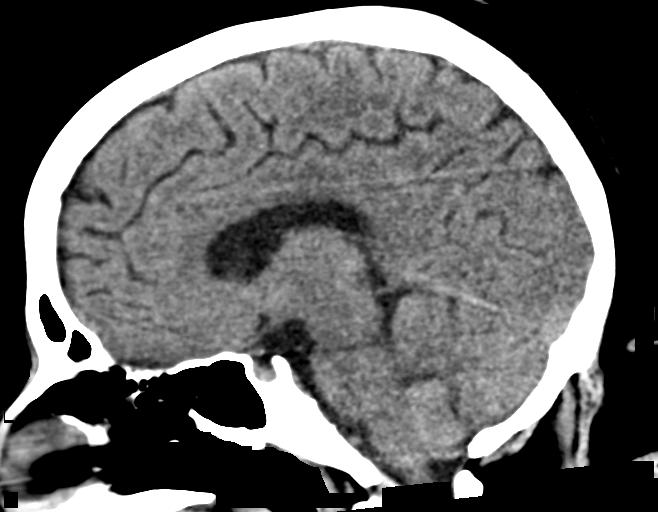
[im 47/71  brain]
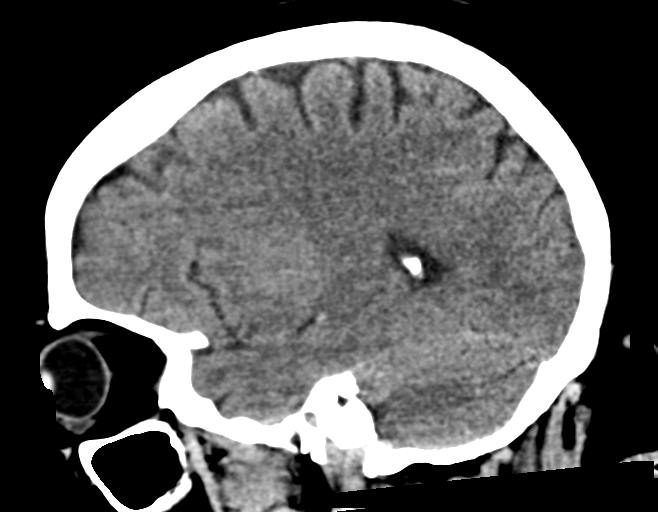

[15 of 47 positions shown; findings below may reference images not displayed]

FINDINGS: Brain: No evidence of acute infarction, hemorrhage, hydrocephalus,
extra-axial collection or mass lesion/mass effect.

Vascular: No hyperdense vessel or unexpected calcification.

Skull: Normal. Negative for fracture or focal lesion.

Sinuses/Orbits: Normal globes and orbits. Small area polypoid
mucosal thickening in the anterior inferior right maxillary sinus.
Sinuses otherwise clear.

Other: None.
IMPRESSION: No intracranial abnormalities.

## 2021-08-28 IMAGING — DX DG CHEST 1V PORT
1 series · 1 of 1 positions shown · non-contrast
Comparison: 06/28/2004

CLINICAL DATA: [id number] positivity with headaches and chest pain,
initial encounter

EXAM:
PORTABLE CHEST 1 VIEW

[chest ap]
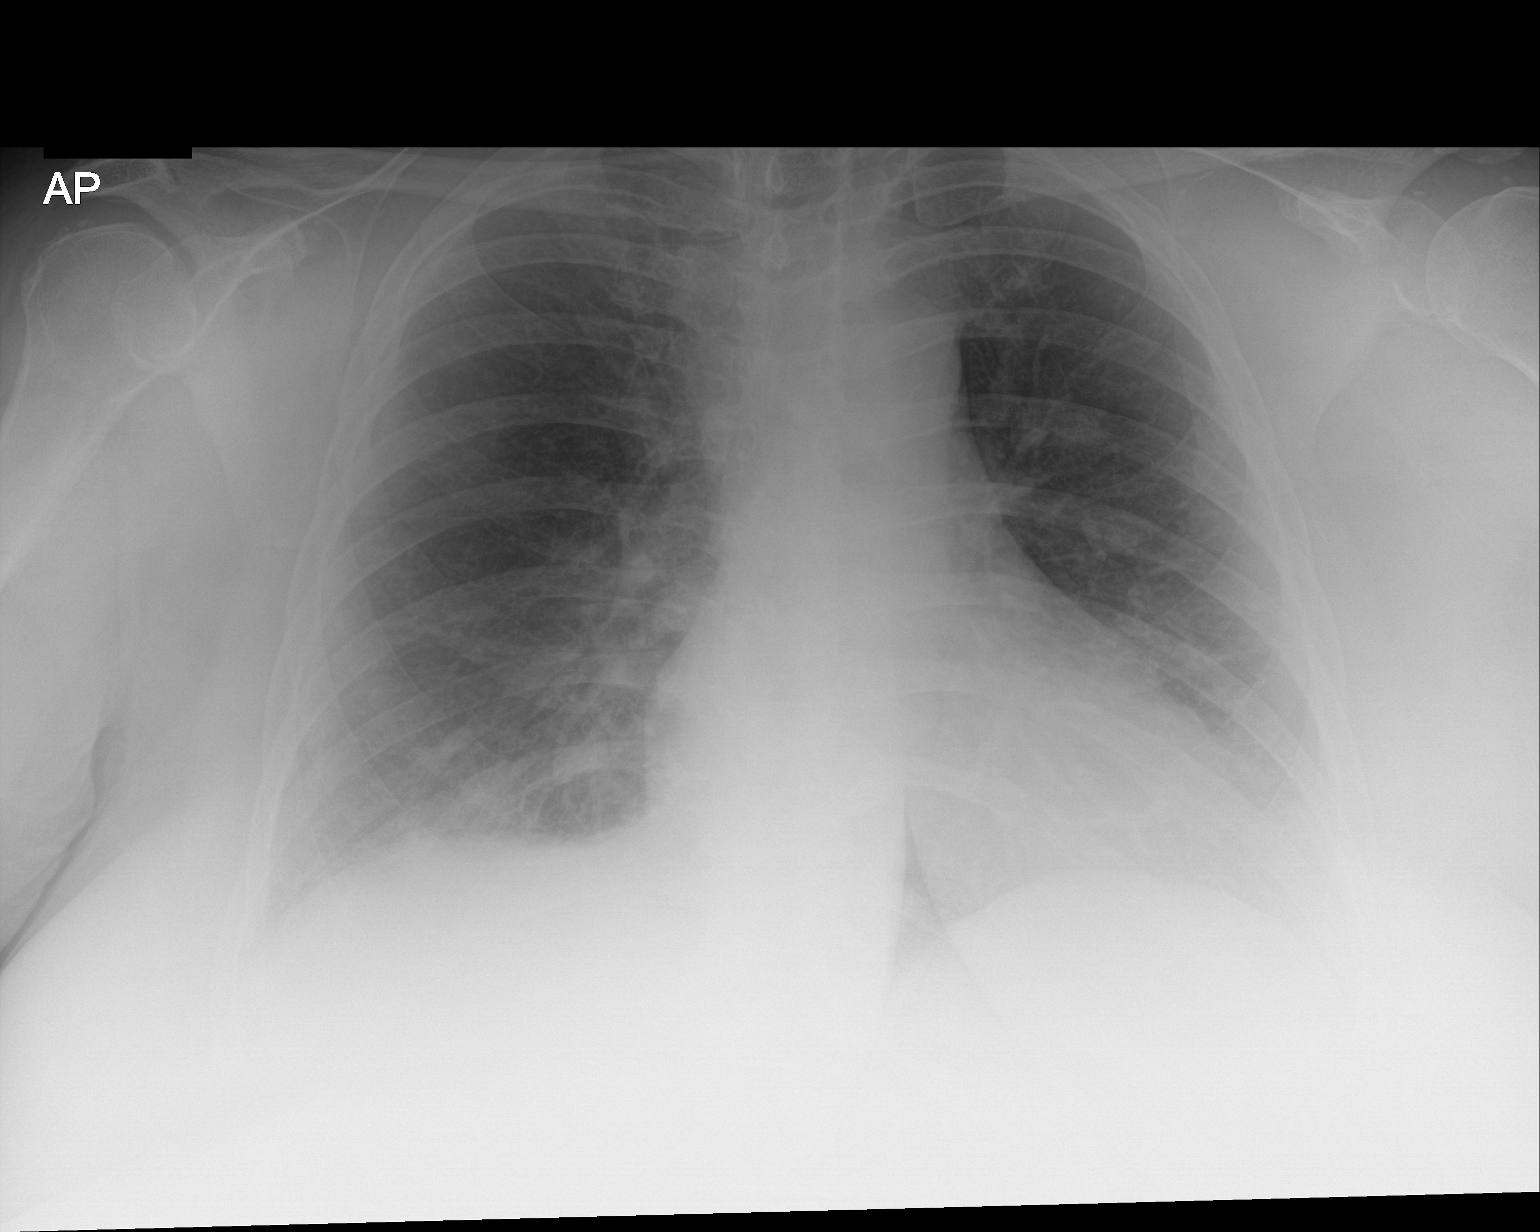

[1 of 1 positions shown; findings below may reference images not displayed]

FINDINGS: Cardiac shadow is within normal limits. The lungs are well aerated
bilaterally. Minimal bibasilar atelectatic changes are seen. No bony
abnormality is noted.
IMPRESSION: Mild bibasilar atelectasis.

## 2021-08-28 IMAGING — MR MR MRA HEAD W/O CM
2 series · 22 of 48 positions shown · non-contrast
Comparison: Head CT 01/06/2020

CLINICAL DATA: Subarachnoid hemorrhage suspected. Additional
provided: Subarachnoid hemorrhage suspected, COVID positive with
headaches for 9 days.

EXAM:
MRI HEAD WITHOUT CONTRAST
MRA HEAD WITHOUT CONTRAST
TECHNIQUE: Multiplanar, multiecho pulse sequences of the brain and surrounding
structures were obtained without intravenous contrast. Angiographic
images of the head were obtained using MRA technique without
contrast.

[Series 2: tof_3d_multi-slab · axial · 0.5mm · 0.35mm/px · z∈[-75,+28]mm · 19 of 217 slices shown]
[im 1/217]
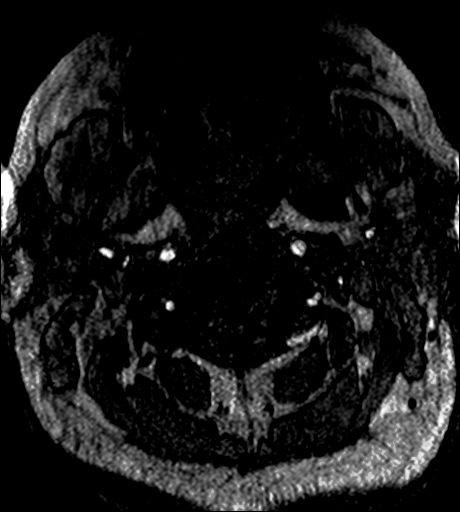
[im 5/217]
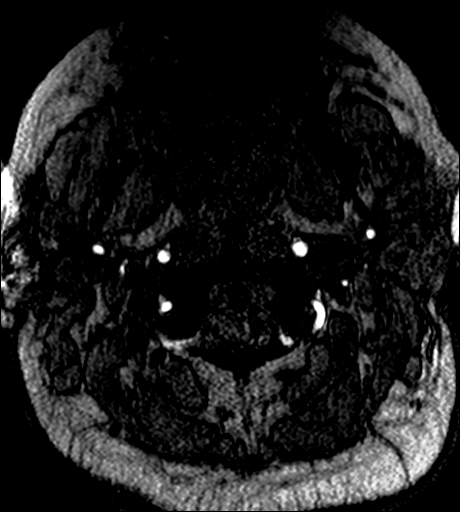
[im 10/217]
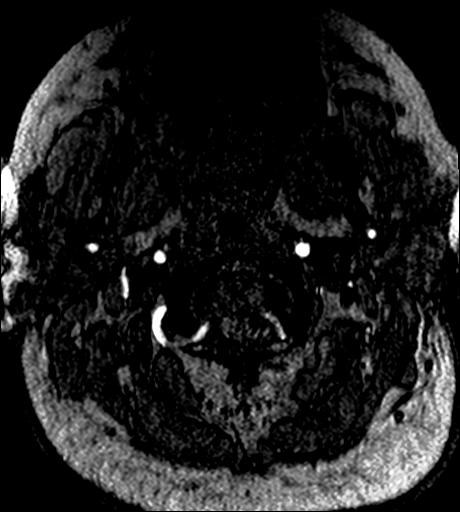
[im 15/217]
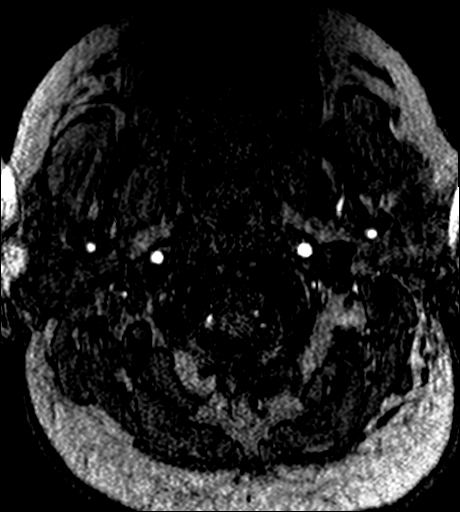
[im 20/217]
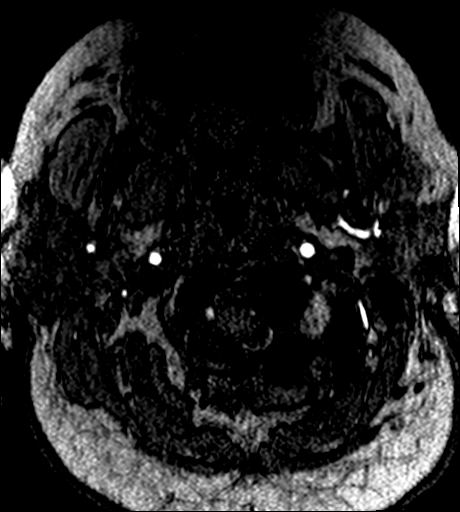
[im 25/217]
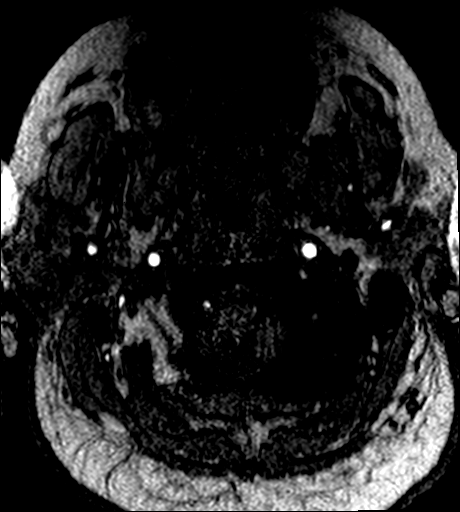
[im 30/217]
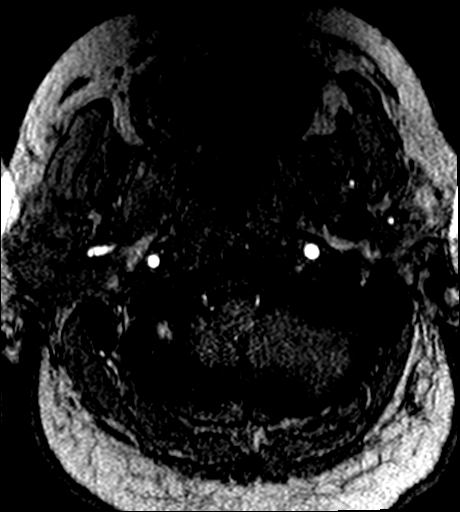
[im 35/217]
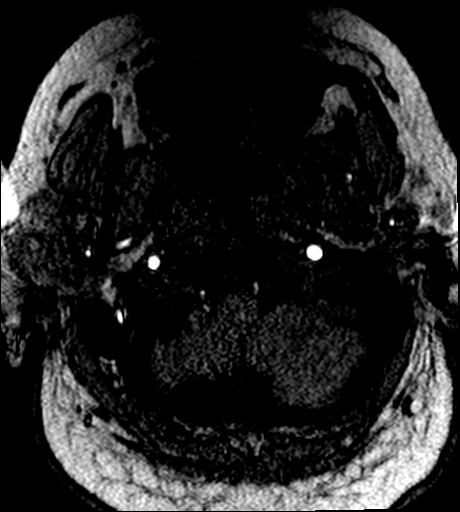
[im 40/217]
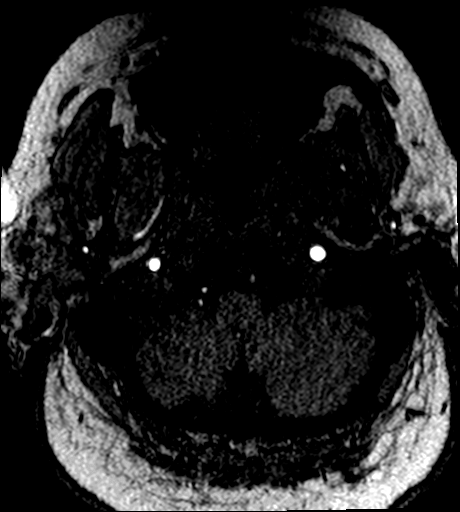
[im 45/217]
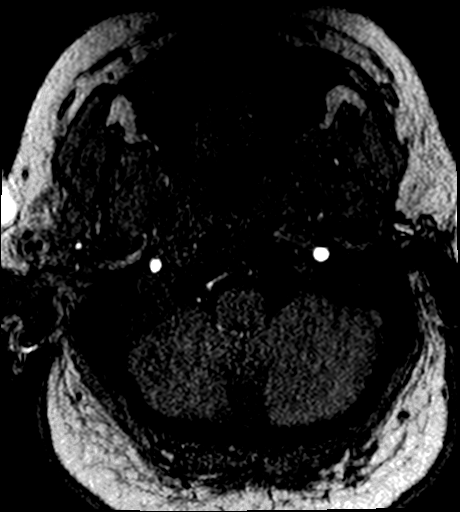
[im 50/217]
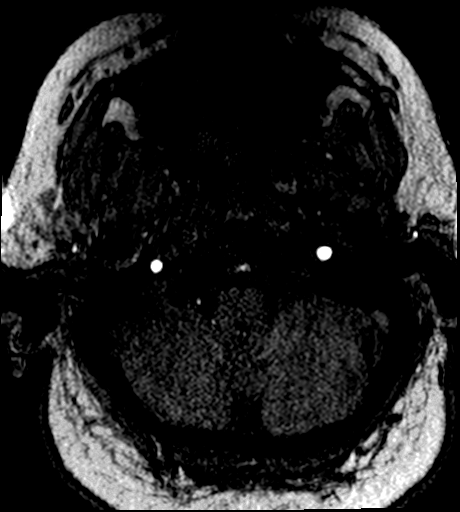
[im 69/217]
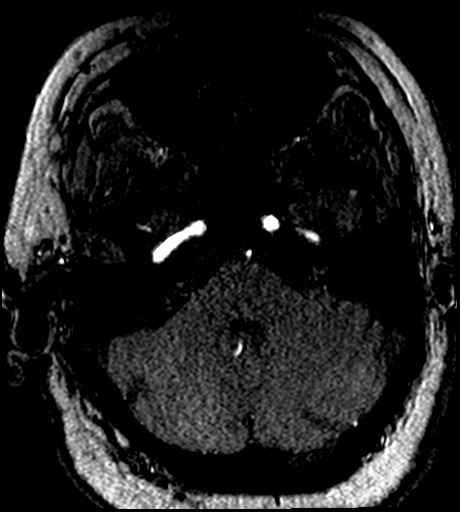
[im 94/217]
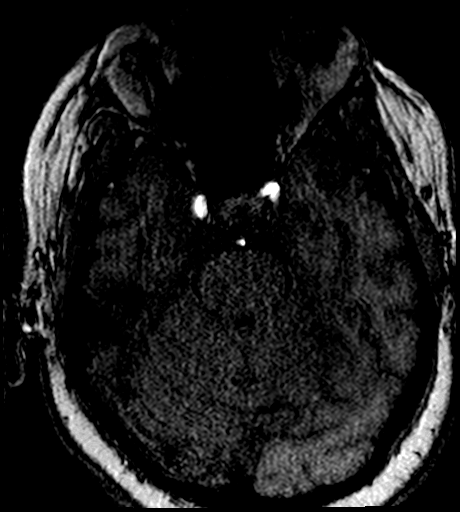
[im 109/217]
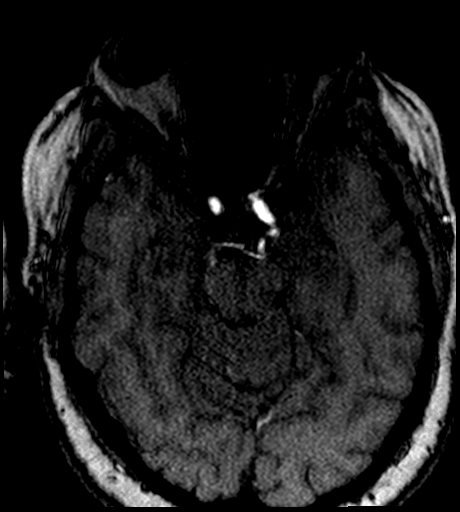
[im 123/217]
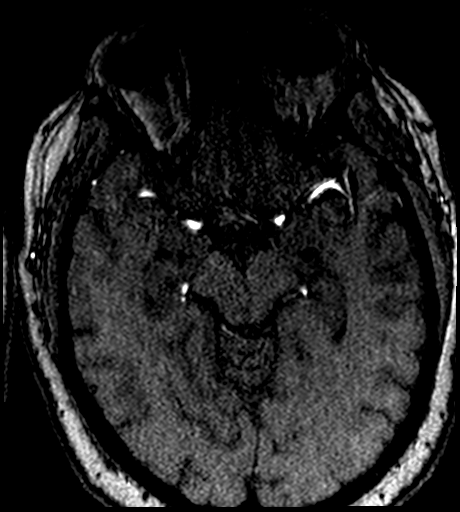
[im 148/217]
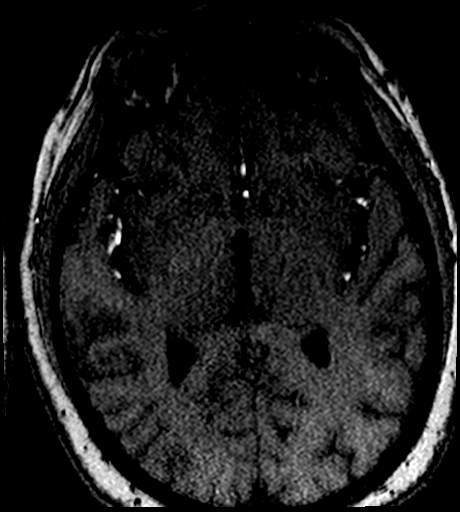
[im 177/217]
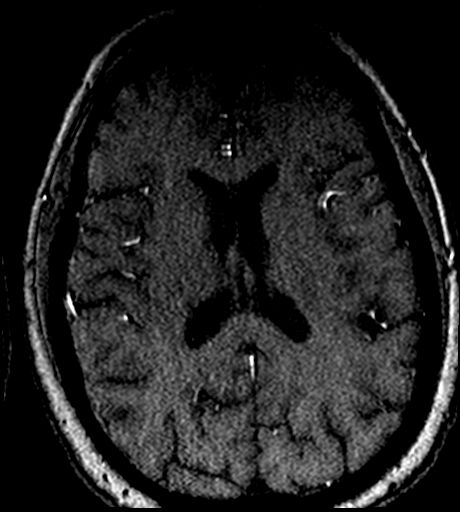
[im 182/217]
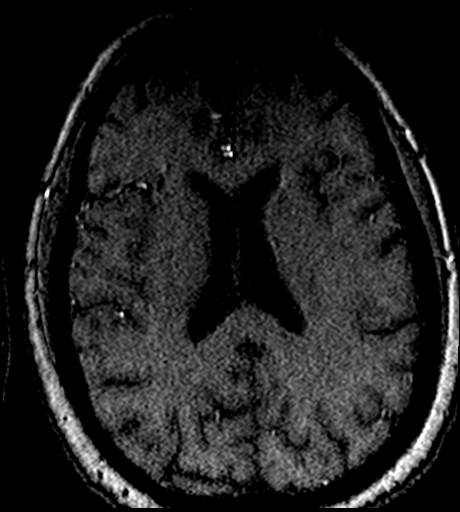
[im 207/217]
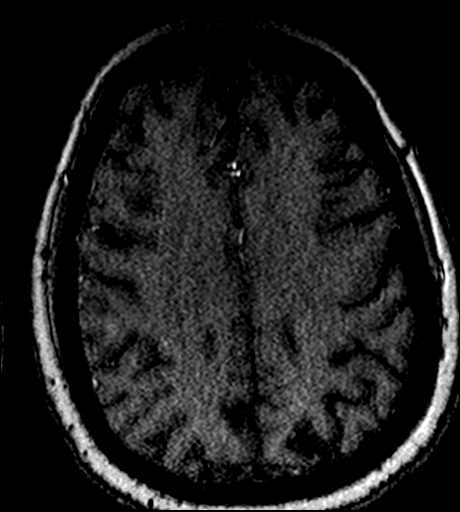

[Series 100: hx · axial · 8.0mm · 1.17mm/px · z∈[-192,+192]mm · 3 of 14 slices shown]
[im 1/14]
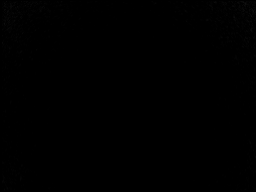
[im 7/14]
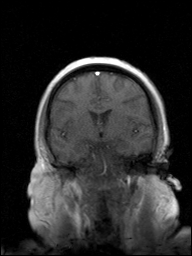
[im 14/14]
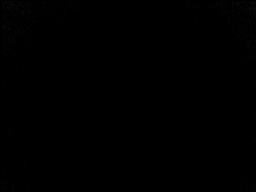

[22 of 48 positions shown; findings below may reference images not displayed]

FINDINGS: MRI HEAD FINDINGS

Brain:

Cerebral volume is normal.

Minimal scattered T2/FLAIR hyperintensity within the cerebral white
matter is nonspecific, but consistent with chronic small vessel
ischemic disease.

There is no acute infarct.

No evidence of intracranial mass.

No chronic intracranial blood products.

No extra-axial fluid collection.

No midline shift.

Vascular: Reported below.

Skull and upper cervical spine: No focal marrow lesion.

Sinuses/Orbits: Visualized orbits show no acute finding. Mild
ethmoid sinus mucosal thickening. Small right maxillary sinus mucous
retention cyst. No significant mastoid effusion

MRA HEAD FINDINGS

The intracranial internal carotid arteries are patent.

The M1 middle cerebral arteries are patent without significant
stenosis. No M2 proximal branch occlusion or high-grade proximal
stenosis is identified.

Developmentally absent A1 right anterior cerebral artery. The A1
left ACA and more distal anterior cerebral arteries are patent.

There is a slightly bulbous appearance of the anterior communicating
artery measuring approximately 1 mm (series 103, image 6). This may
reflect a tiny aneurysm.

The non dominant intracranial left vertebral artery is
developmentally diminutive, but patent. The dominant cranial right
vertebral artery is patent without significant stenosis. The basilar
artery is developmentally diminutive, but patent without significant
stenosis. The posterior cerebral arteries are patent proximally
without significant stenosis. The posterior cerebral arteries are
predominantly fetal in origin bilaterally.
IMPRESSION: MRI brain:

1. No evidence of acute intracranial abnormality.
2. Mild chronic small vessel ischemic changes within the cerebral
white matter

MRA head:

1. No intracranial large vessel occlusion or proximal high-grade
arterial stenosis.
2. Developmentally absent A1 right anterior cerebral artery.
3. There is a slightly bulbous appearance of the anterior
communicating artery measuring 1 mm in diameter. This may reflect a
tiny aneurysm.
4. The intracranial vertebral and basilar arteries are
developmentally diminutive in the setting of predominantly fetal
origin posterior cerebral arteries.

## 2021-08-30 IMAGING — DX DG CHEST 1V PORT
1 series · 1 of 1 positions shown · non-contrast
Comparison: January 06, 2020

CLINICAL DATA: Shortness of breath.  [id number] positive

EXAM:
PORTABLE CHEST 1 VIEW

[chest ap]
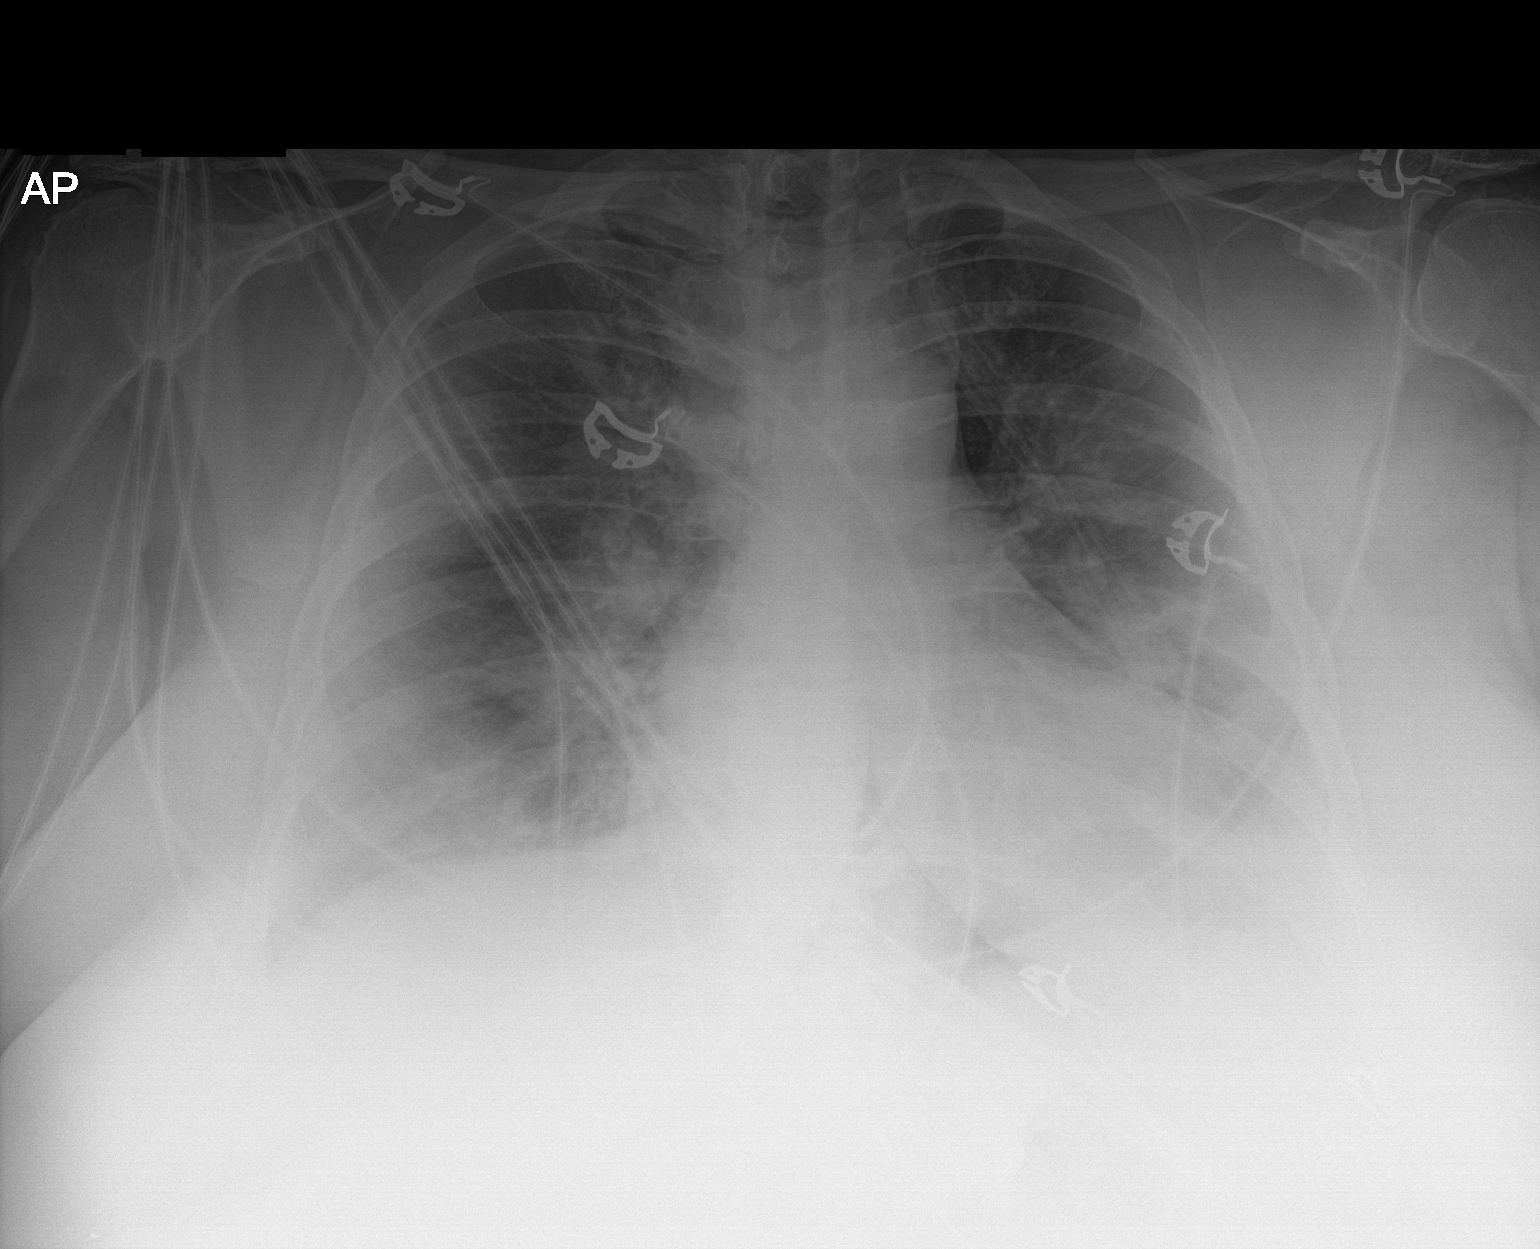

[1 of 1 positions shown; findings below may reference images not displayed]

FINDINGS: There is ill-defined patchy airspace disease in the left mid lung
and lung base regions. No consolidation. Heart size and pulmonary
vascularity are normal. No adenopathy. There is postoperative change
in the lower cervical region.
IMPRESSION: Areas of somewhat ill-defined patchy airspace disease, likely due to
atypical organism pneumonia. Heart size and pulmonary vascular
normal. No adenopathy.

## 2021-08-31 IMAGING — DX DG ABDOMEN 1V
1 series · 1 of 1 positions shown · non-contrast
Comparison: None.

CLINICAL DATA: Endogastric tube placement.

EXAM:
ABDOMEN - 1 VIEW

[abdomen kub]
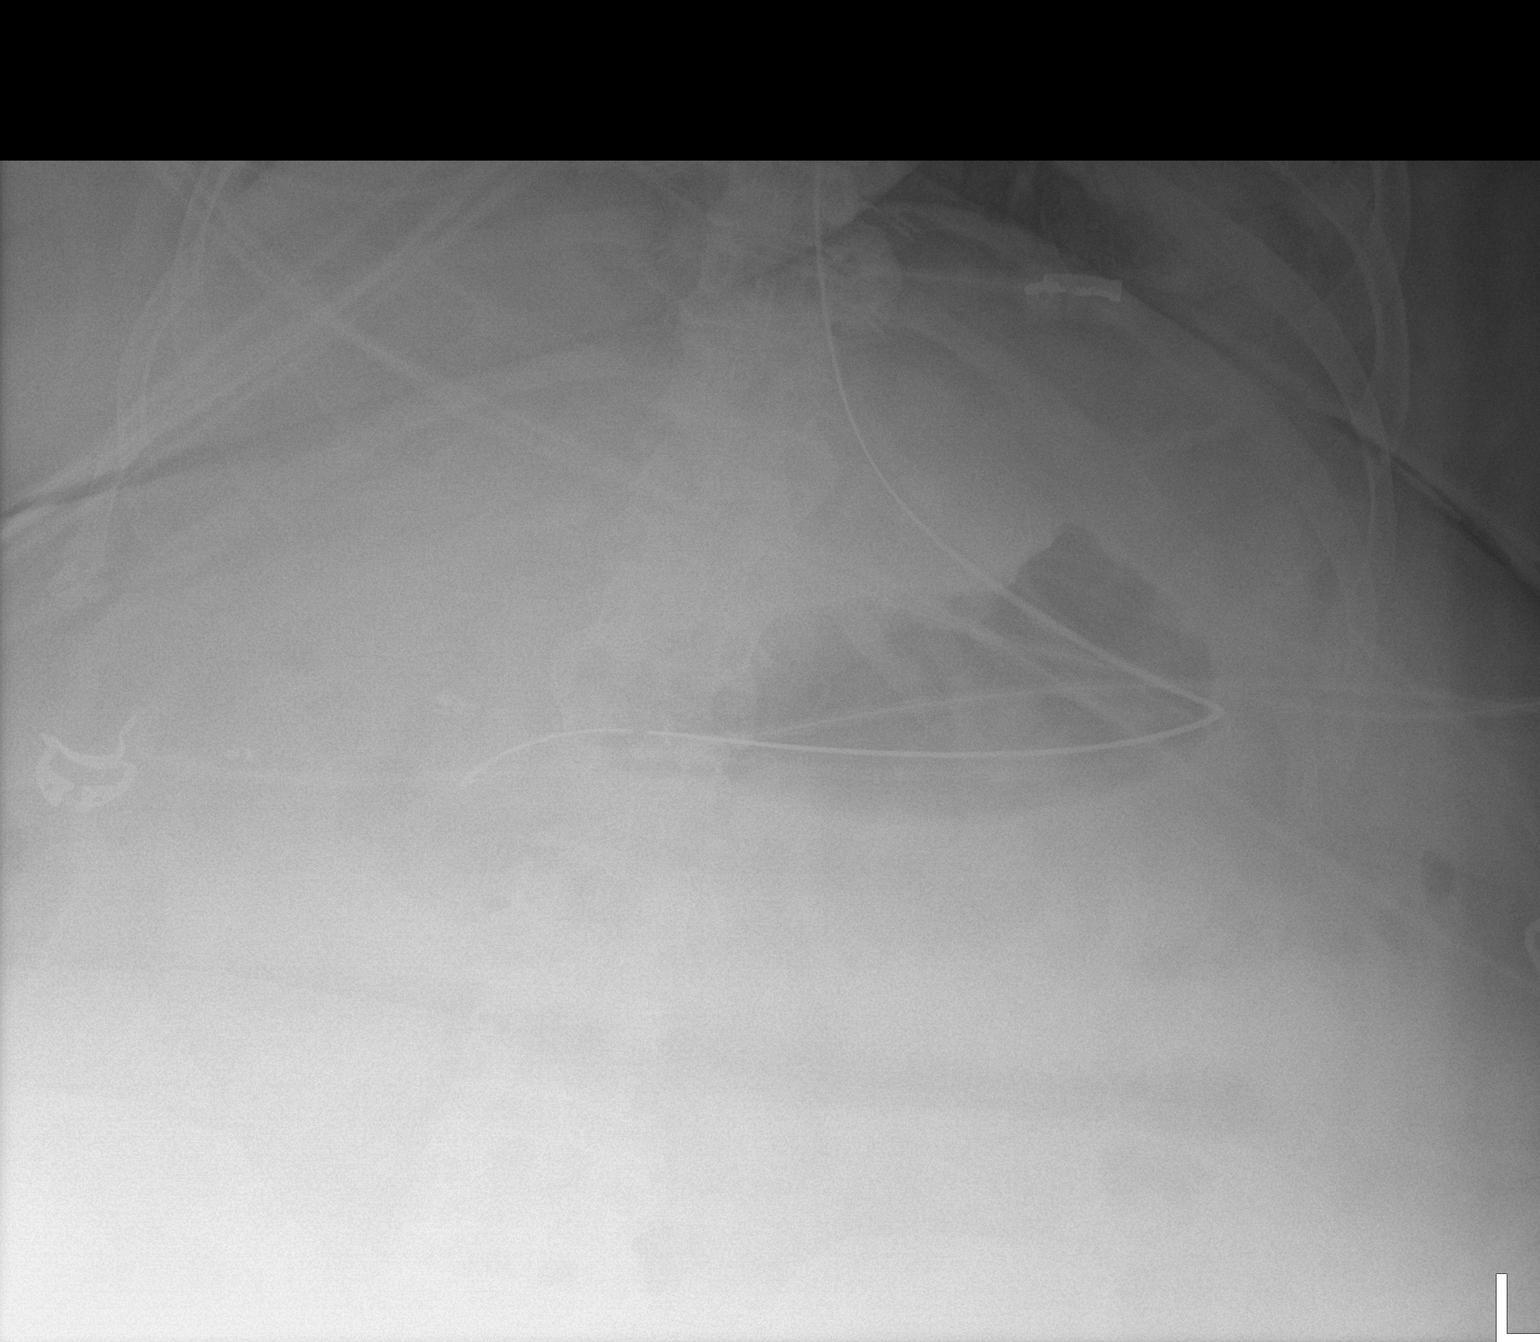

[1 of 1 positions shown; findings below may reference images not displayed]

FINDINGS: A nasogastric tube is seen with its distal tip overlying the
expected region of the duodenal bulb. The bowel gas pattern is
normal. No radio-opaque calculi or other significant radiographic
abnormality are seen. Radiopaque surgical clips are seen overlying
the right upper quadrant.
IMPRESSION: Nasogastric tube positioning as described above.

## 2021-08-31 IMAGING — DX DG CHEST 1V PORT
1 series · 1 of 1 positions shown · non-contrast
Comparison: January 08, 2020

CLINICAL DATA: Hypoxia.  Reported [id number] positive

EXAM:
PORTABLE CHEST 1 VIEW

[chest ap]
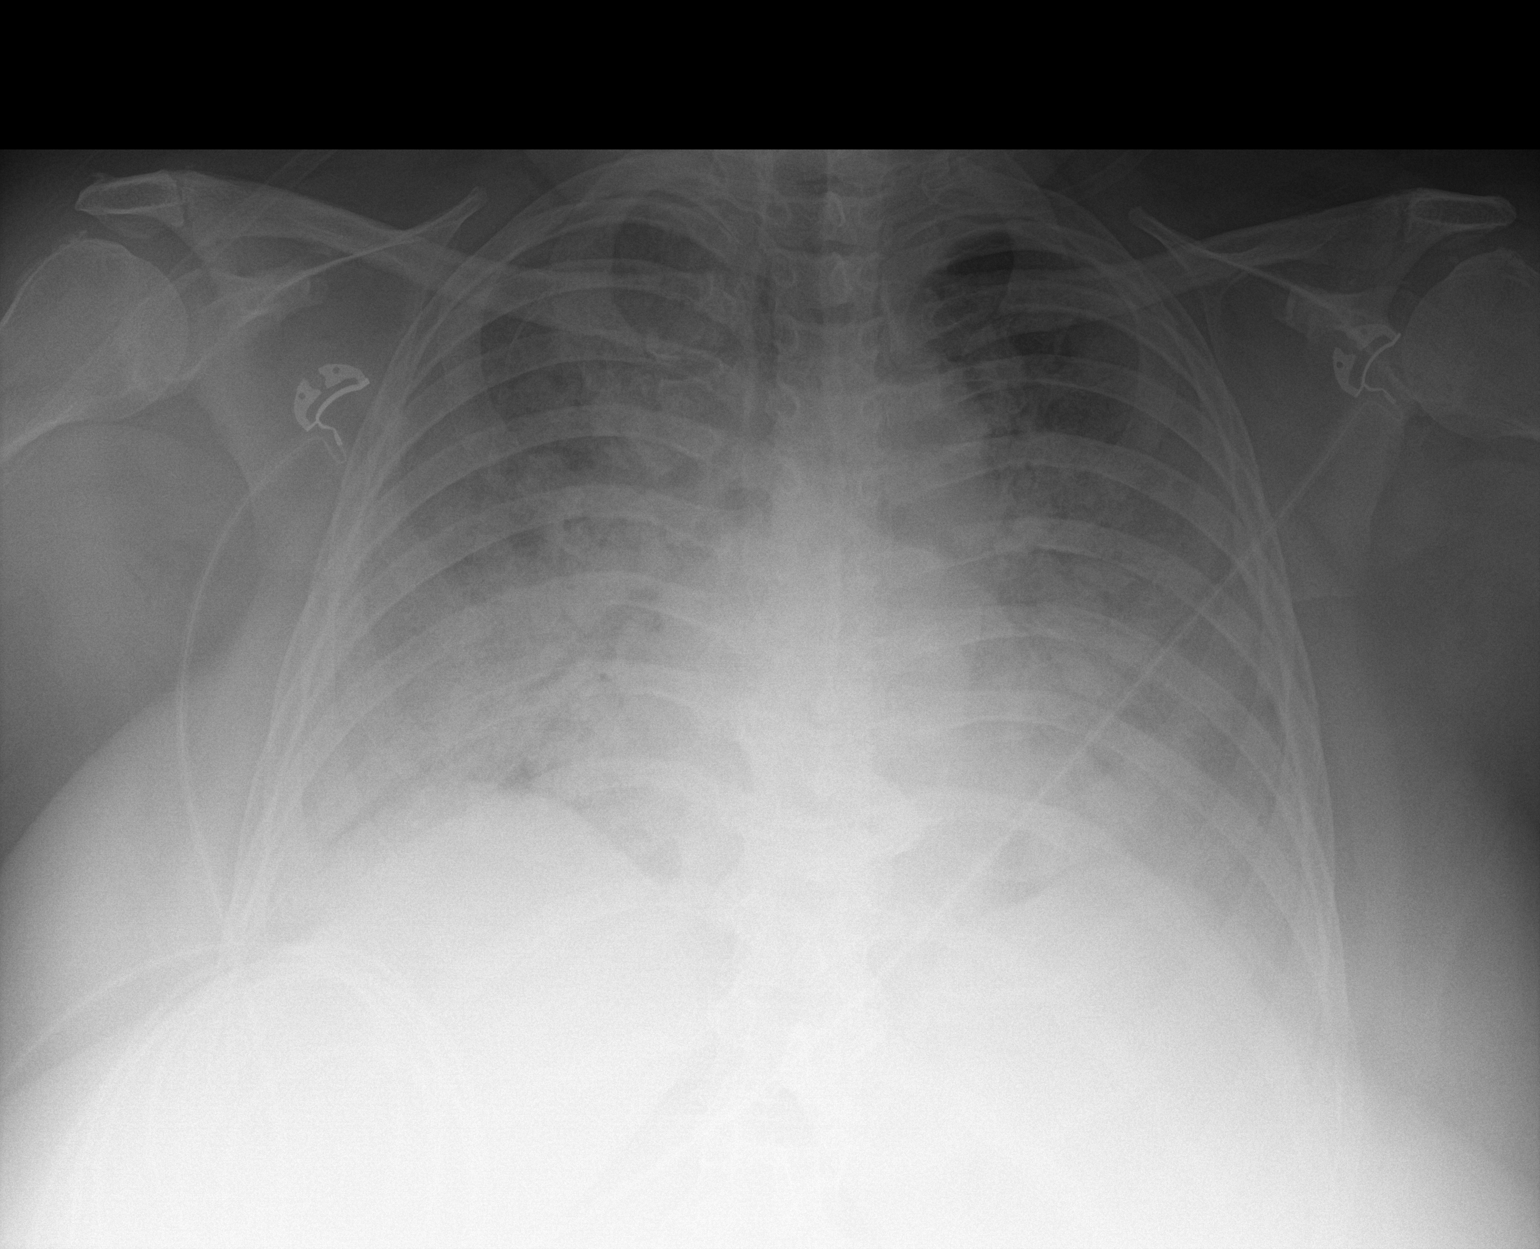

[1 of 1 positions shown; findings below may reference images not displayed]

FINDINGS: There is widespread airspace opacity bilaterally, overall increased
compared to 1 day prior. Heart is upper normal in size with
pulmonary vascularity normal. No adenopathy appreciable.
Postoperative change noted in the lower cervical region.
IMPRESSION: Widespread airspace opacity bilaterally, increased from 1 day prior.
Suspect multifocal pneumonia, likely of atypical organism etiology,
particularly given the clinical history. Note that a degree of
pulmonary edema and/or ARDS superimposed cannot be excluded
radiographically.

Stable cardiac silhouette.

## 2021-08-31 IMAGING — DX DG CHEST 1V PORT
1 series · 1 of 1 positions shown · non-contrast
Comparison: [DATE] [DATE], [DATE] ([DATE] p.m.)

CLINICAL DATA: Status post intubation.

EXAM:
PORTABLE CHEST 1 VIEW

[chest ap]
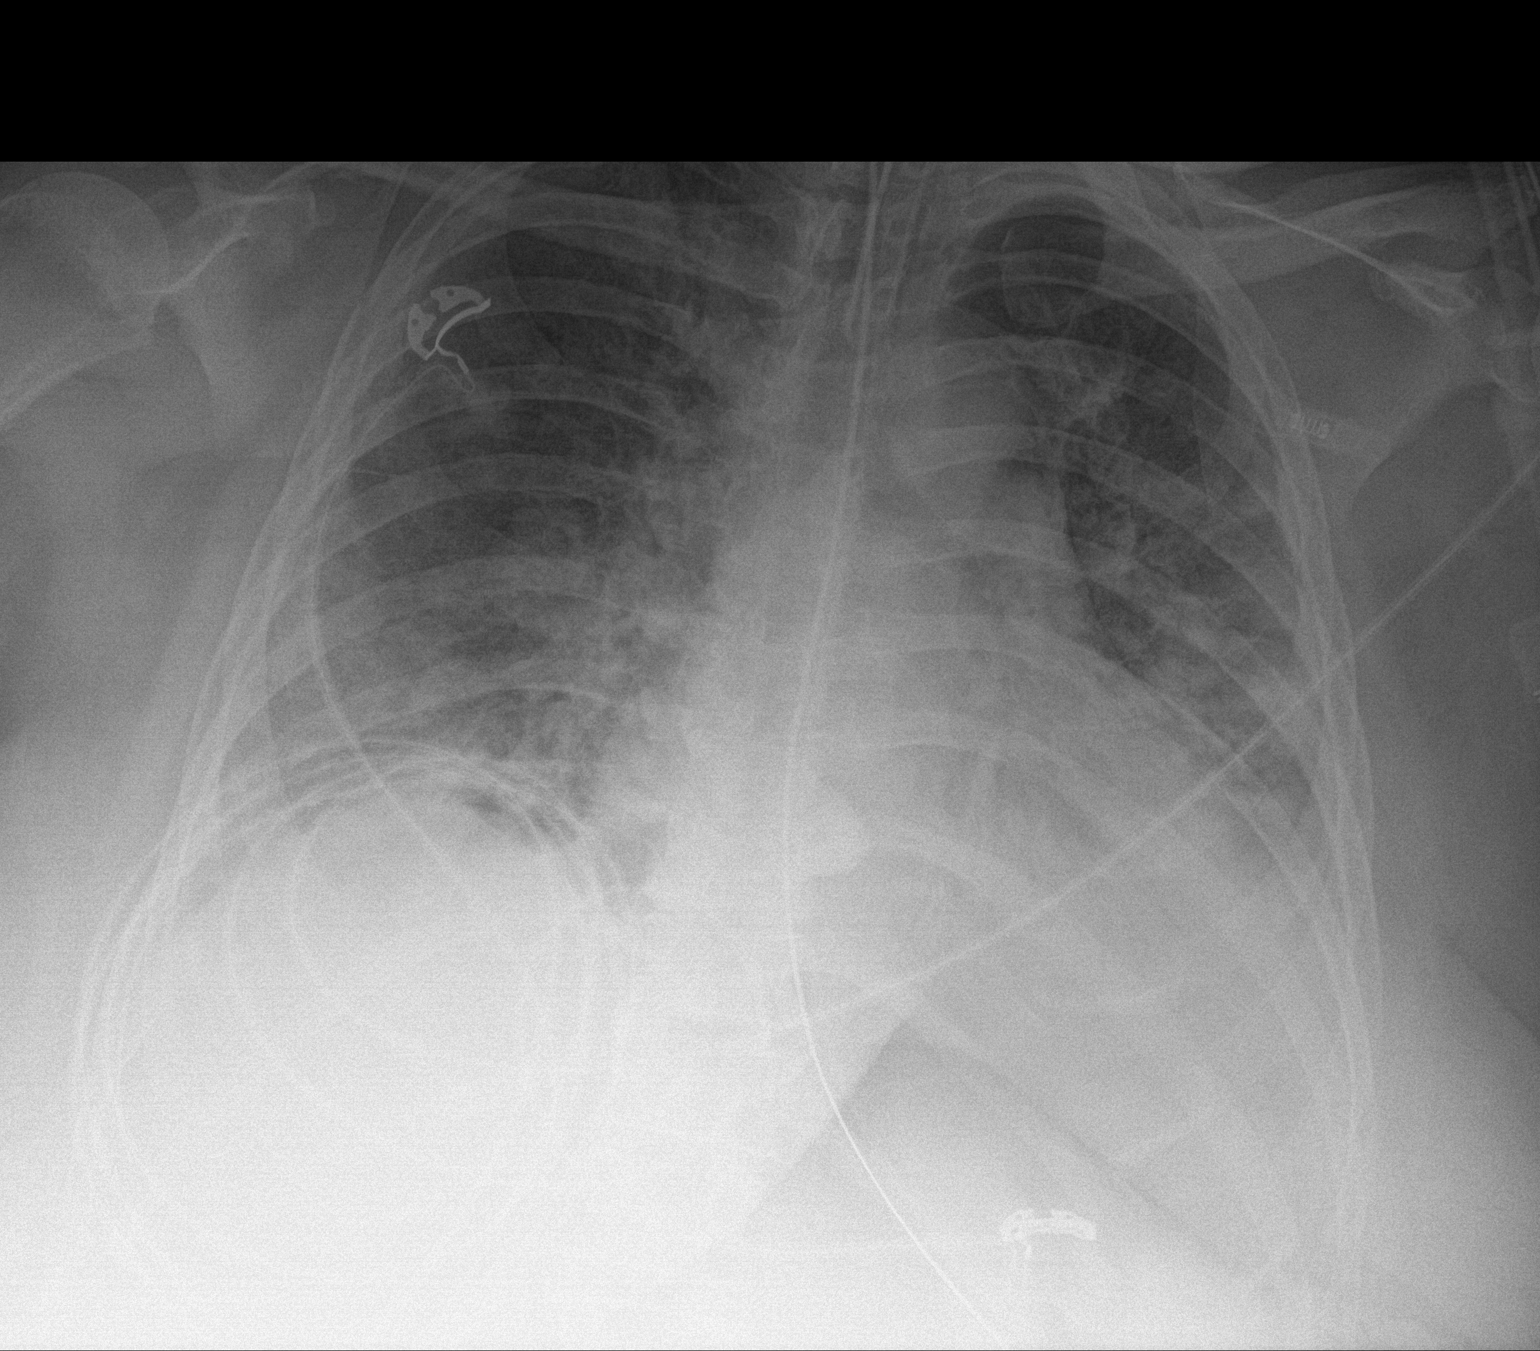

[1 of 1 positions shown; findings below may reference images not displayed]

FINDINGS: An endotracheal tube is seen with its distal tip approximately
cm from the carina. A nasogastric tube is noted with its distal end
overlying the body of the stomach. Mild diffusely increased lung
markings are seen which is decreased in severity when compared to
the prior study. Mild atelectasis and/or infiltrate is noted within
the mid left lung and bilateral lung bases. The cardiac silhouette
is moderately enlarged. The visualized skeletal structures are
unremarkable.
IMPRESSION: 1. Endotracheal tube and nasogastric tube positioning, as described
above.
2. Mild atelectasis and/or infiltrate noted within the mid left lung
and bilateral lung bases.

## 2021-09-01 IMAGING — DX DG CHEST 1V PORT
1 series · 1 of 1 positions shown · non-contrast
Comparison: January 10, 2020 study obtained earlier in the day

CLINICAL DATA: Hypoxia

EXAM:
PORTABLE CHEST 1 VIEW

[chest ap]
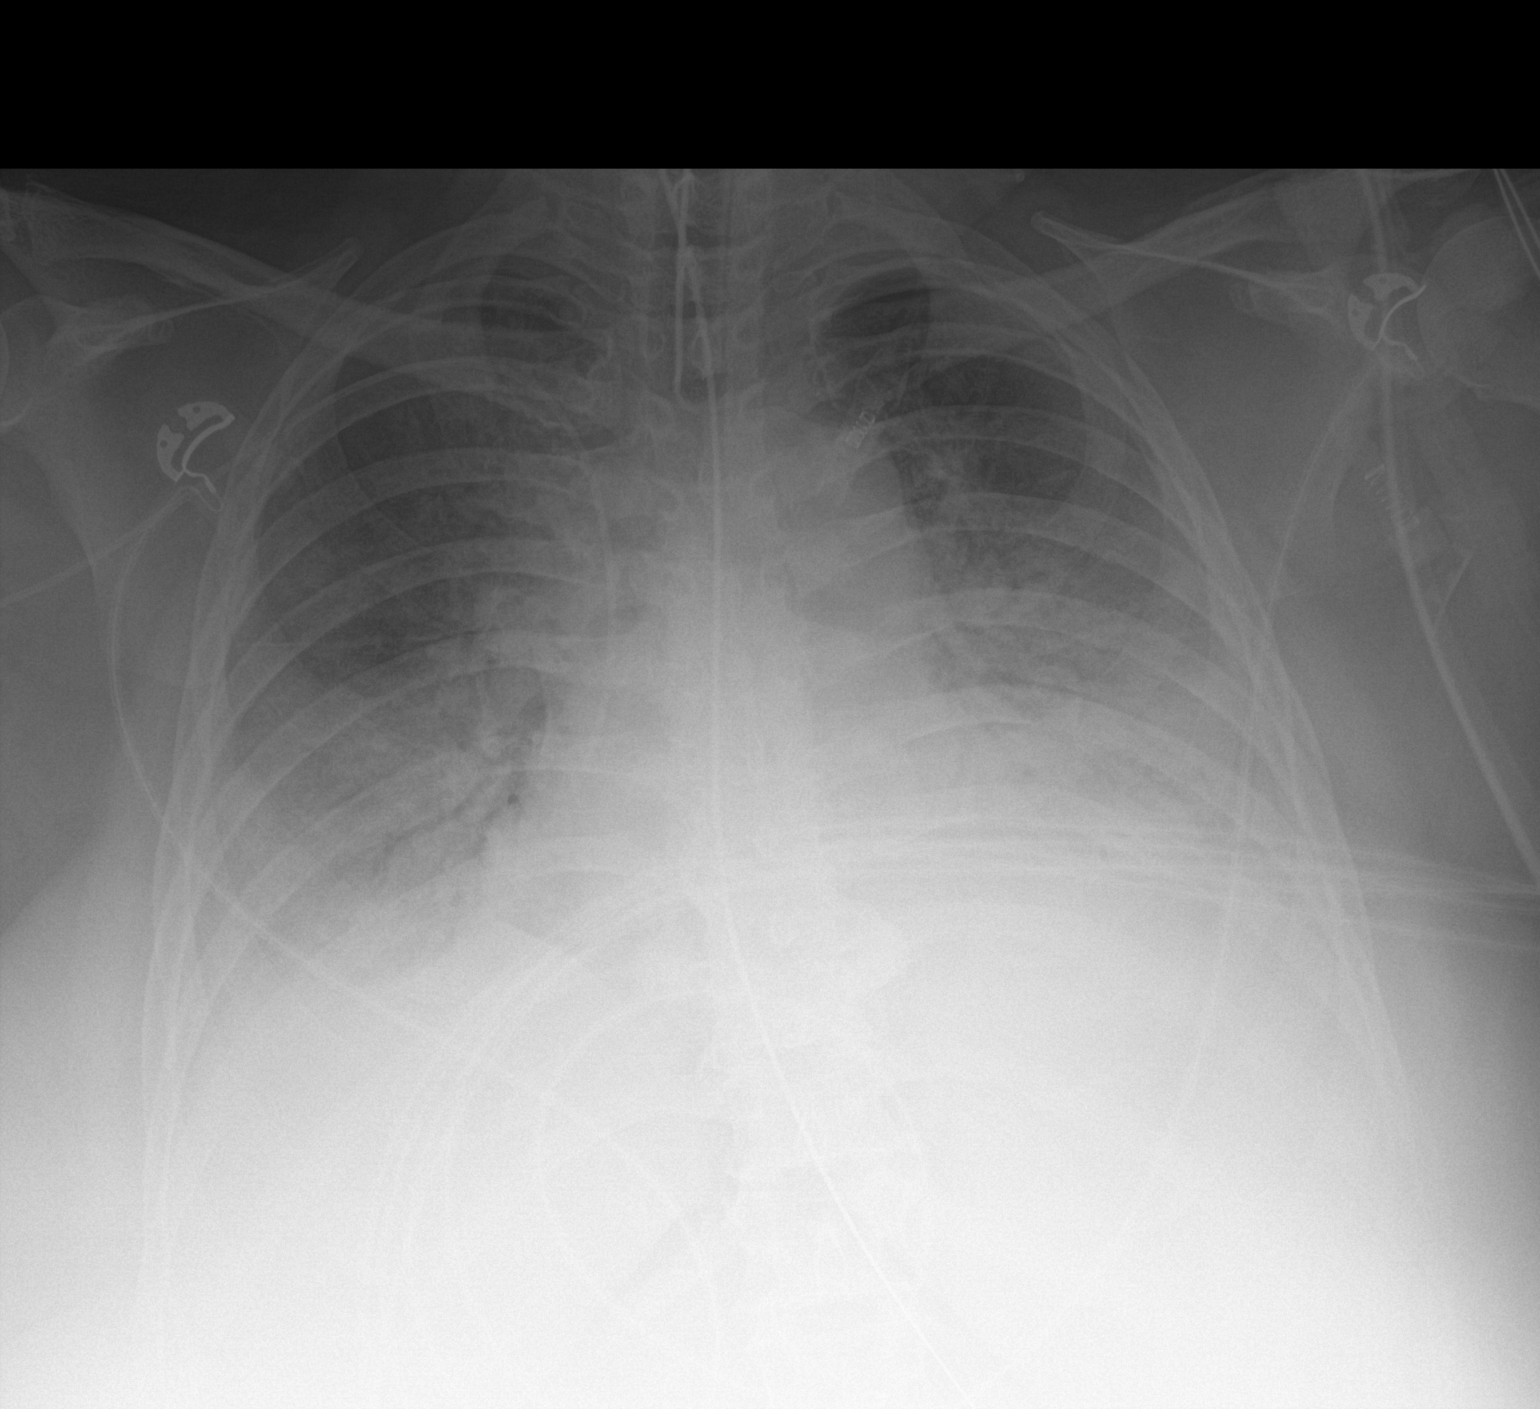

[1 of 1 positions shown; findings below may reference images not displayed]

FINDINGS: Endotracheal tube tip is 4.1 cm above the carina. Nasogastric tube
tip and side port are below the diaphragm. Central catheter tip is
in the superior vena cava near the cavoatrial junction. No
pneumothorax. Multifocal airspace opacity throughout the lungs
bilaterally is stable compared to earlier in the day. Heart is upper
normal in size with pulmonary vascularity normal. No adenopathy. No
bone lesions. Postoperative change noted in the lower cervical
spine.
IMPRESSION: Tube and catheter positions as described without pneumothorax.
Widespread multifocal airspace opacity appear stable consistent with
multifocal pneumonia. A degree of underlying edema and/or ARDS is
possible as well. Stable cardiac silhouette.

## 2021-09-01 IMAGING — DX DG ABDOMEN 1V
1 series · 1 of 1 positions shown · non-contrast
Comparison: January 09, 2020.

CLINICAL DATA: Feeding tube placement.

EXAM:
ABDOMEN - 1 VIEW

[abdomen kub]
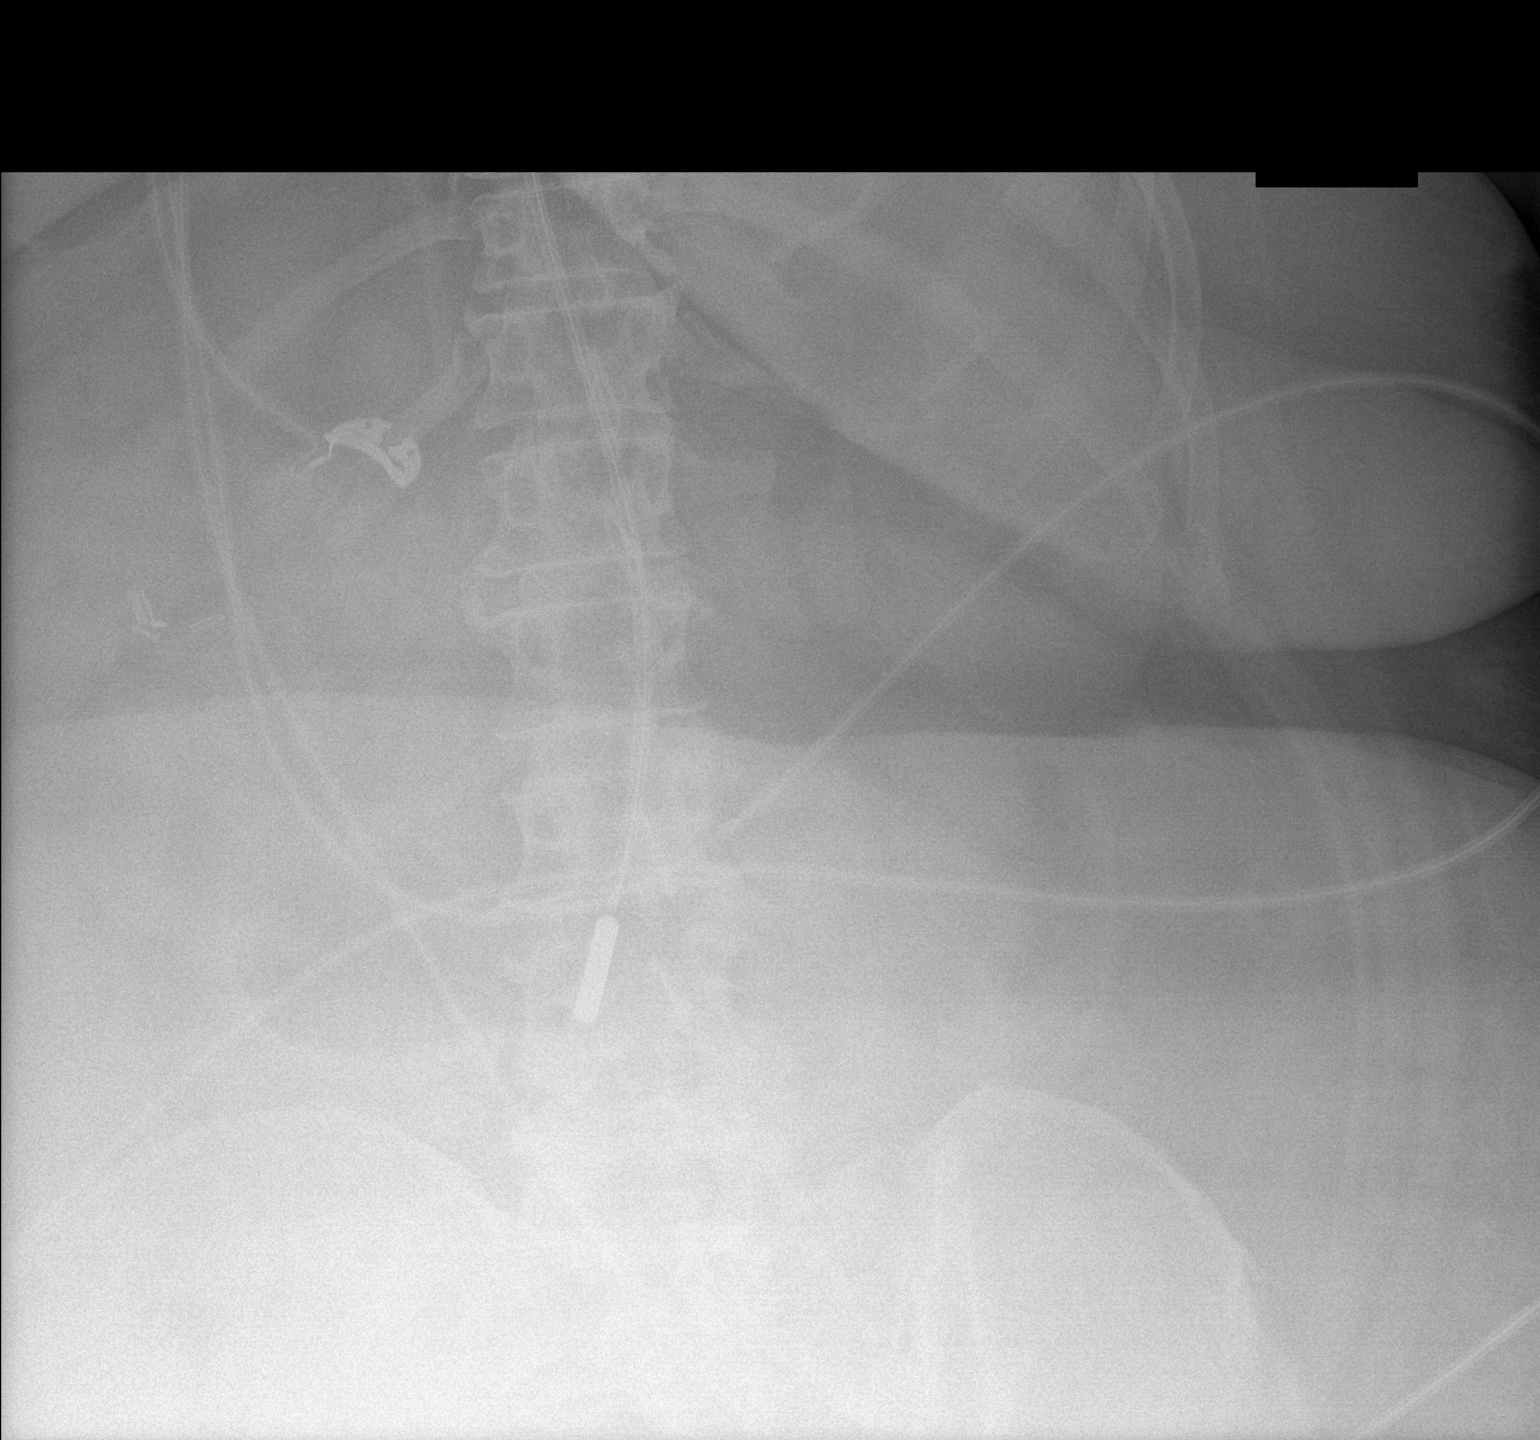

[1 of 1 positions shown; findings below may reference images not displayed]

FINDINGS: The bowel gas pattern is normal. Distal tip of feeding tube is seen
in expected position of distal stomach. No radio-opaque calculi or
other significant radiographic abnormality are seen.
IMPRESSION: Distal tip of feeding tube seen in expected position of distal
stomach.

## 2021-09-01 IMAGING — DX DG CHEST 1V PORT
1 series · 1 of 1 positions shown · non-contrast
Comparison: 01/09/2020

CLINICAL DATA: Hypoxia.  RLRUL-ND ARDS.

EXAM:
PORTABLE CHEST 1 VIEW

[chest ap]
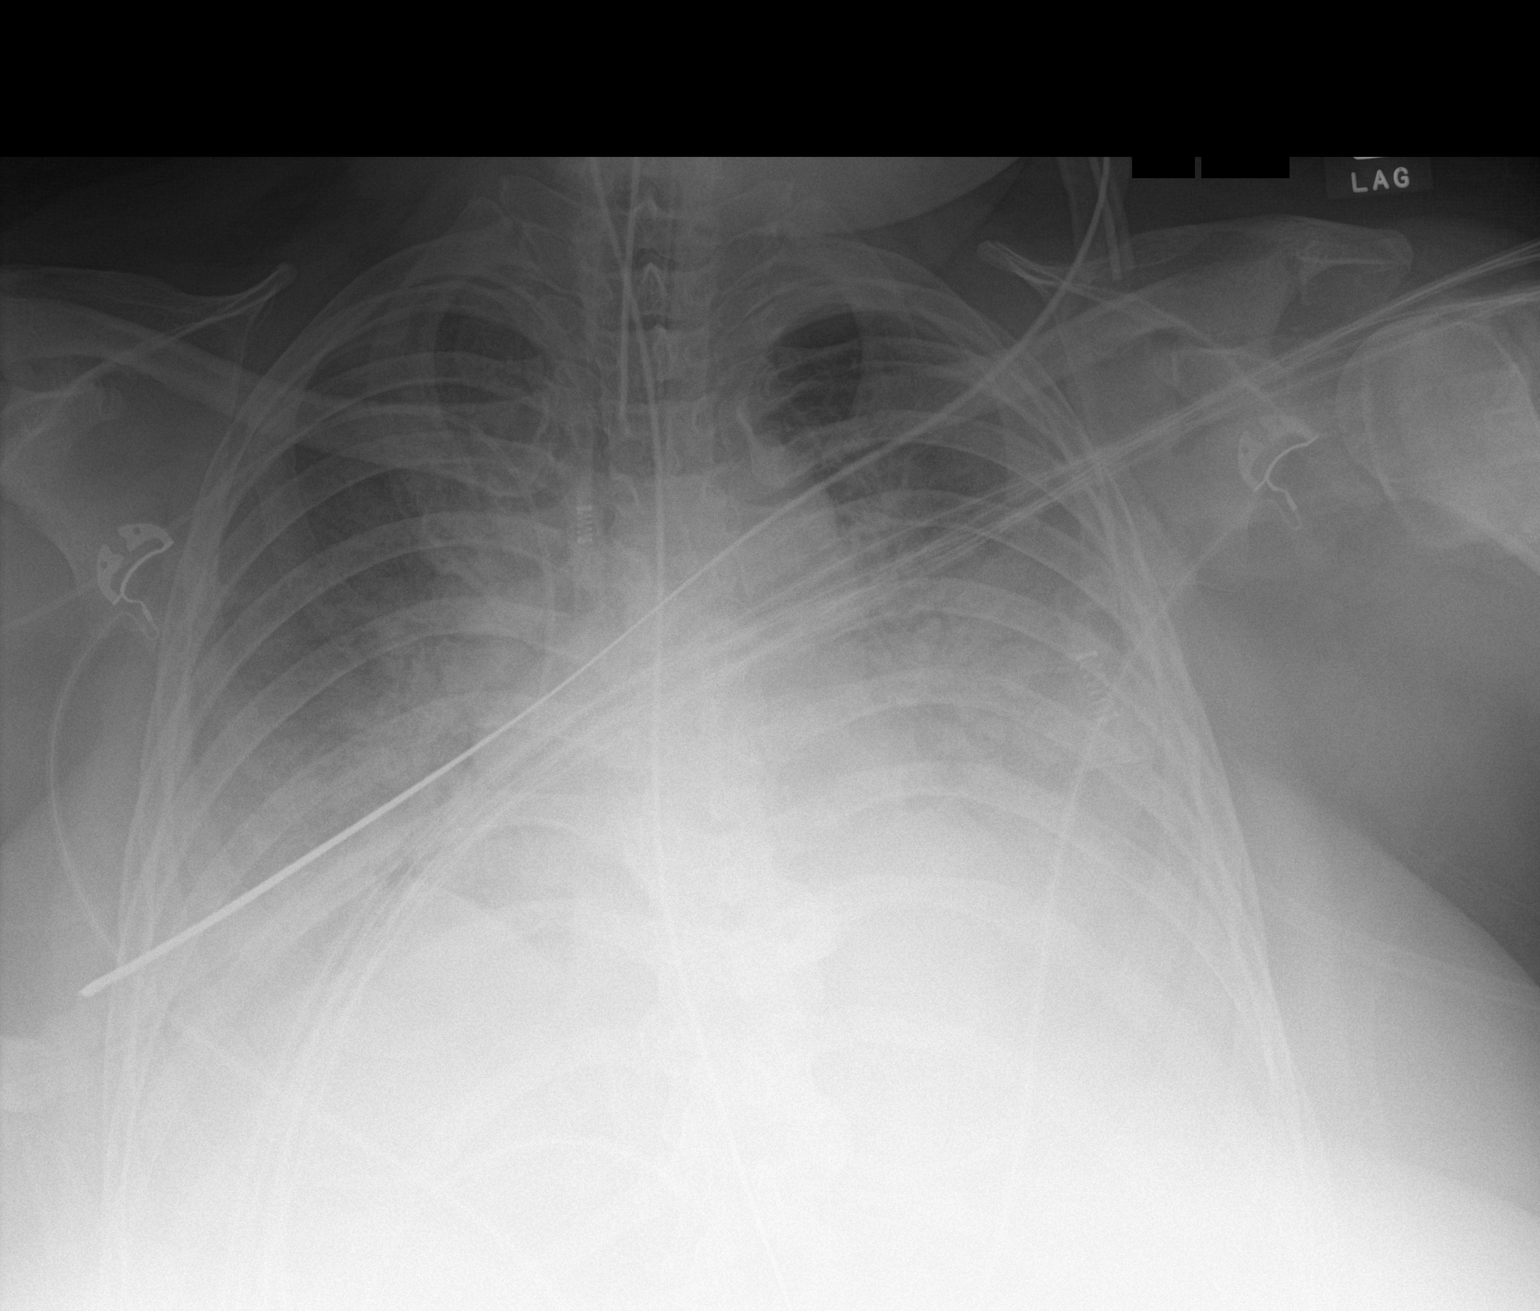

[1 of 1 positions shown; findings below may reference images not displayed]

FINDINGS: Unchanged position of endotracheal tube with tip at the level of the
clavicular heads. Esophageal tube courses below the field of view.
Bilateral airspace opacities have worsened.
IMPRESSION: Worsening opacities, likely pulmonary edema.

## 2021-09-01 IMAGING — DX DG CHEST 1V PORT
1 series · 1 of 1 positions shown · non-contrast
Comparison: Earlier radiograph dated 01/10/2020.

CLINICAL DATA: 54-year-old female with intubation.

EXAM:
PORTABLE CHEST 1 VIEW

[chest pa]
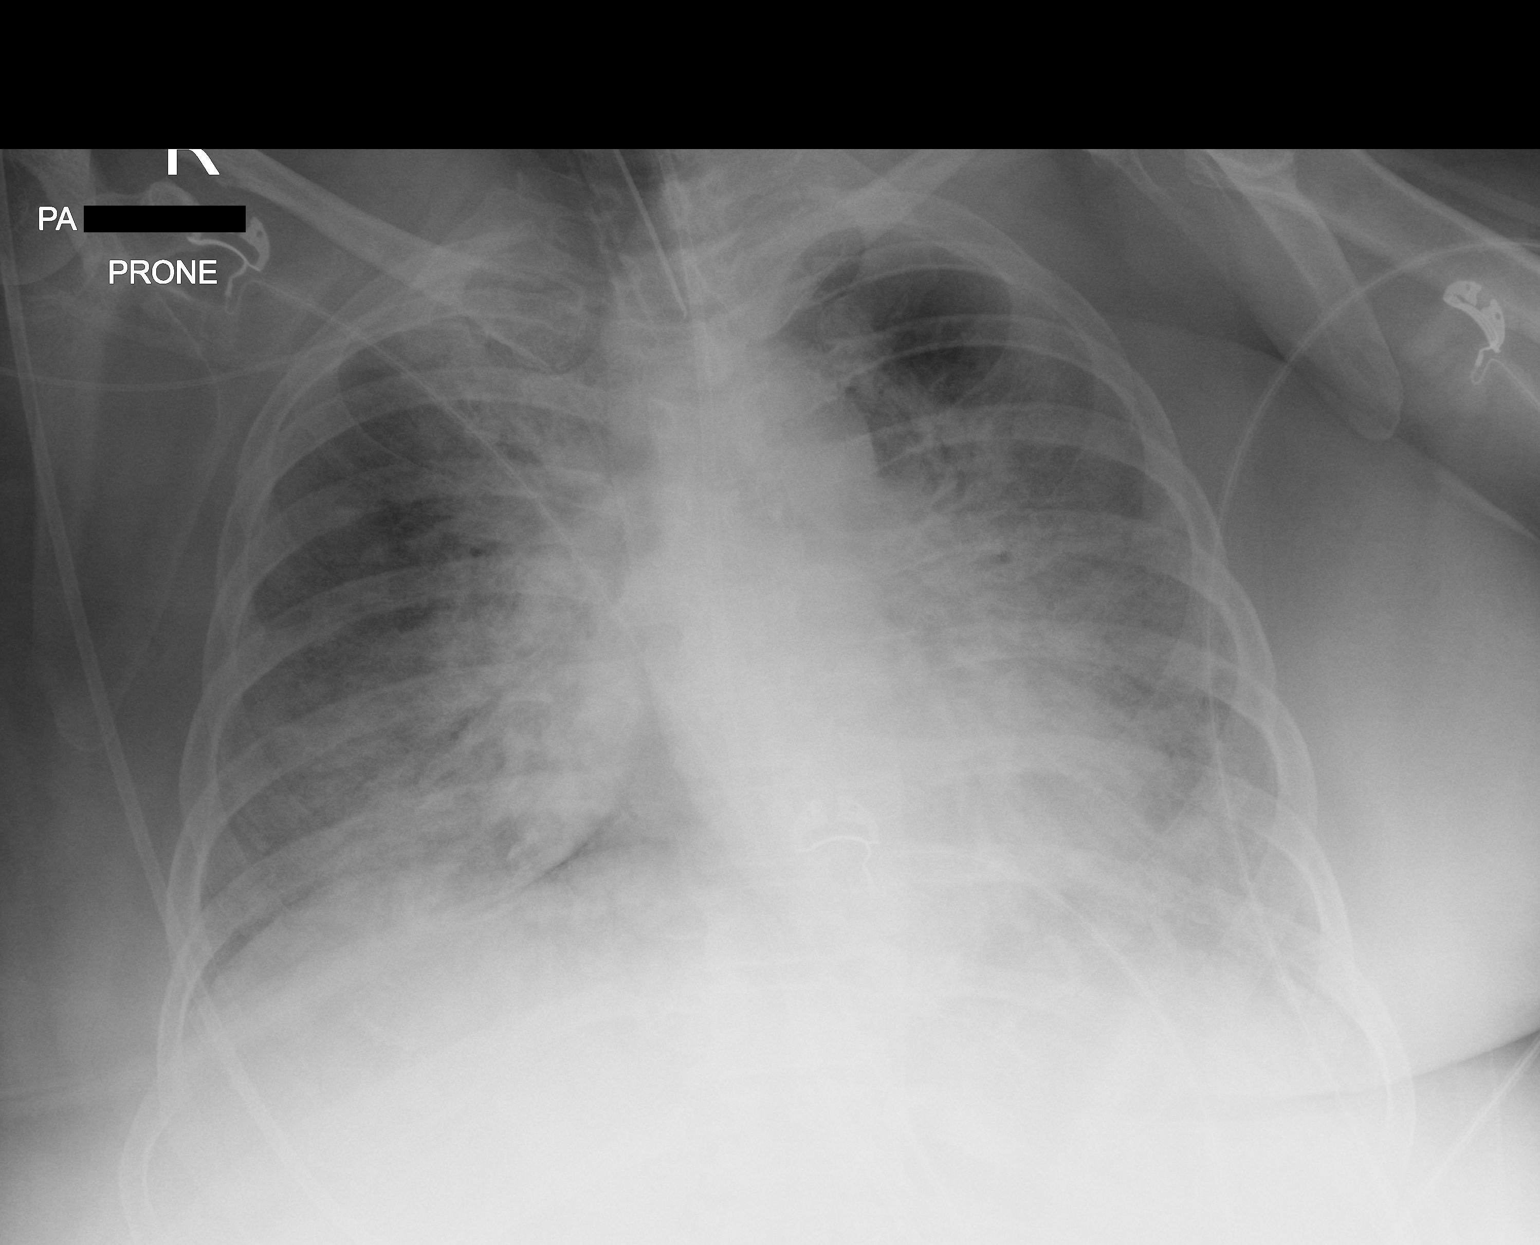

[1 of 1 positions shown; findings below may reference images not displayed]

FINDINGS: Bilateral pulmonary opacities as seen on the earlier radiograph. No
large pleural effusion. No pneumothorax. Stable cardiac silhouette.
No acute osseous pathology.
IMPRESSION: Bilateral pulmonary opacities as seen on the earlier radiograph.
Continued follow-up recommended.

## 2021-09-02 IMAGING — DX DG CHEST 1V PORT
1 series · 1 of 1 positions shown · non-contrast
Comparison: January 11, 2020.

CLINICAL DATA: [id number] positive.

EXAM:
PORTABLE CHEST 1 VIEW

[chest ap]
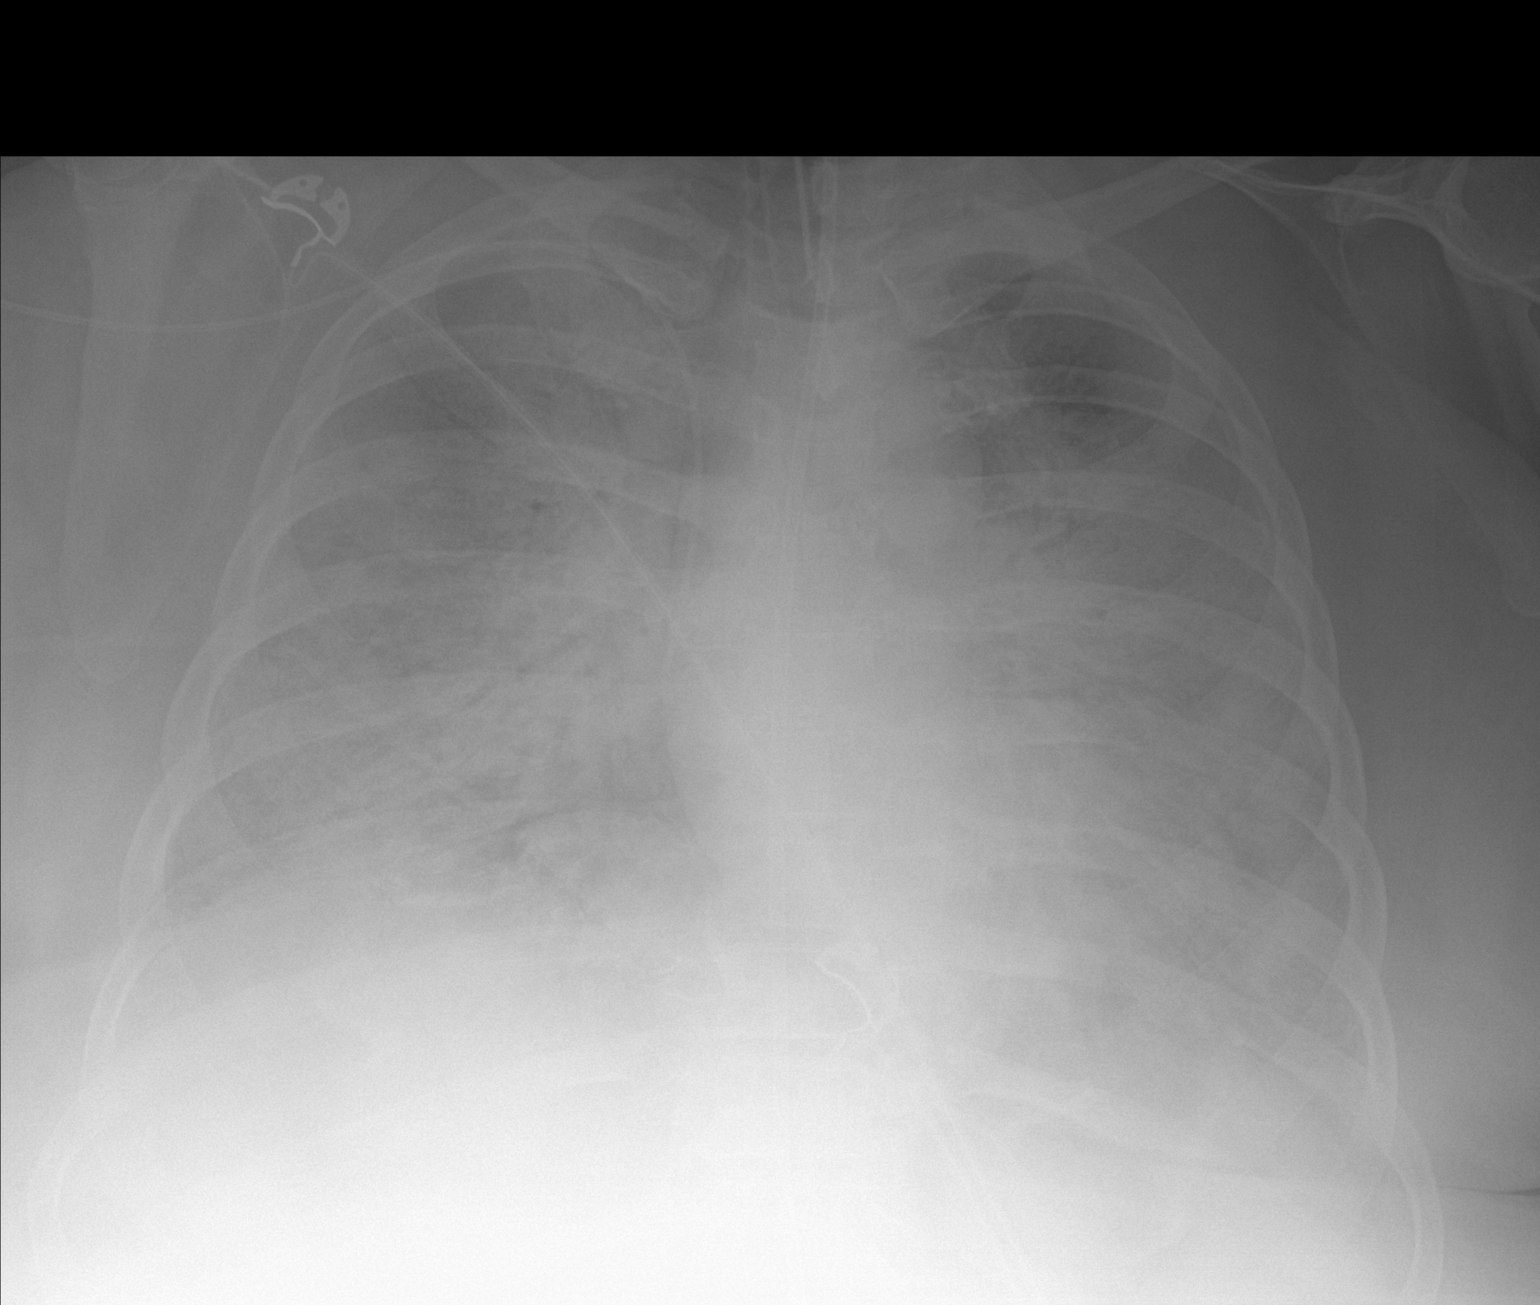

[1 of 1 positions shown; findings below may reference images not displayed]

FINDINGS: Endotracheal and feeding tubes are unchanged in position.
Right-sided PICC line is again noted and unchanged. Diffuse lung
opacities are noted bilaterally consistent with multifocal
pneumonia. No definite pneumothorax or significant pleural effusion
is noted. Bony thorax is unremarkable.
IMPRESSION: Stable support apparatus. Stable bilateral diffuse lung opacities
consistent with multifocal pneumonia.

## 2021-09-02 IMAGING — DX DG CHEST 1V PORT
1 series · 1 of 1 positions shown · non-contrast
Comparison: 01/10/2020.

CLINICAL DATA: Respiratory failure.  COVID.

EXAM:
PORTABLE CHEST 1 VIEW

[chest ap]
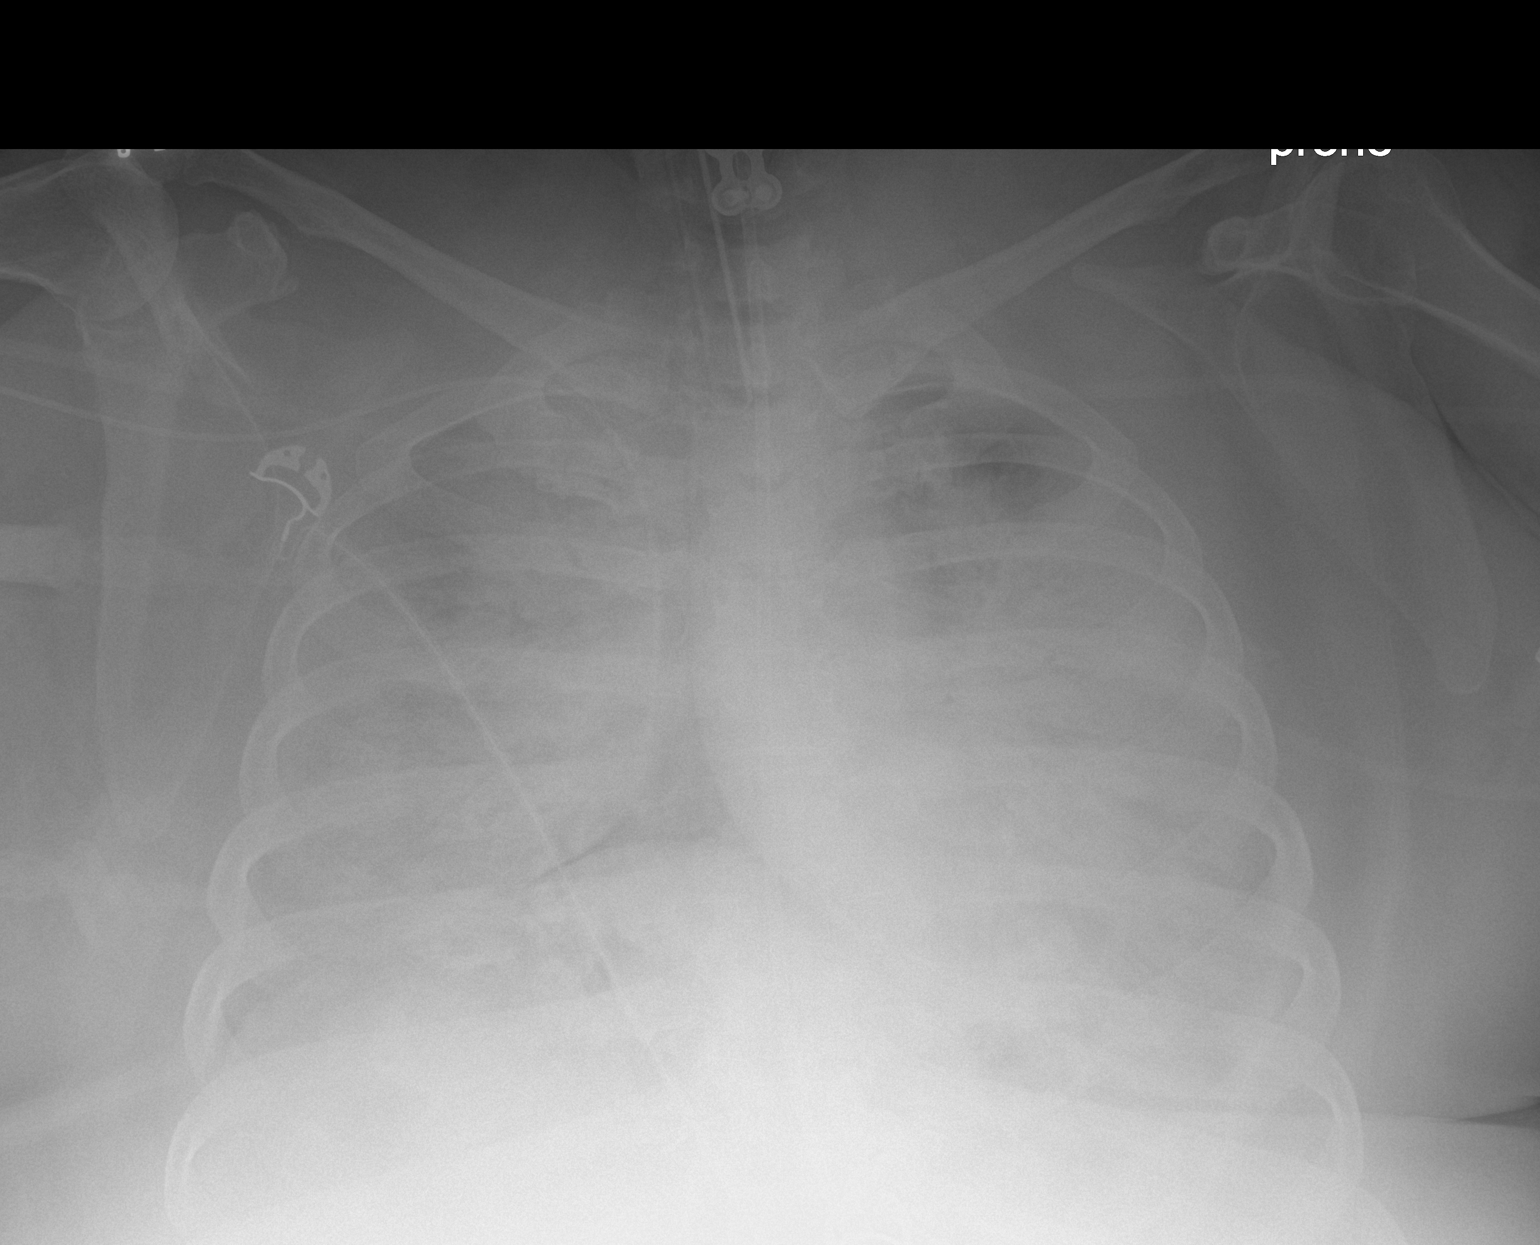

[1 of 1 positions shown; findings below may reference images not displayed]

FINDINGS: Endotracheal tube, feeding tube, right PICC line in stable position.
Heart size normal. Progressive diffuse severe bilateral pulmonary
infiltrates with near complete opacification of both lungs. No
pleural effusion or pneumothorax. Prior cervical spine fusion.
IMPRESSION: 1. Endotracheal tube, feeding tube, right PICC line stable position.

2. Progressive diffuse severe bilateral pulmonary infiltrates with
near complete opacification of both lungs.

## 2021-09-03 IMAGING — DX DG CHEST 1V PORT
1 series · 1 of 1 positions shown · non-contrast
Comparison: [DATE]

CLINICAL DATA: COVID positive

EXAM:
PORTABLE CHEST 1 VIEW

[chest ap]
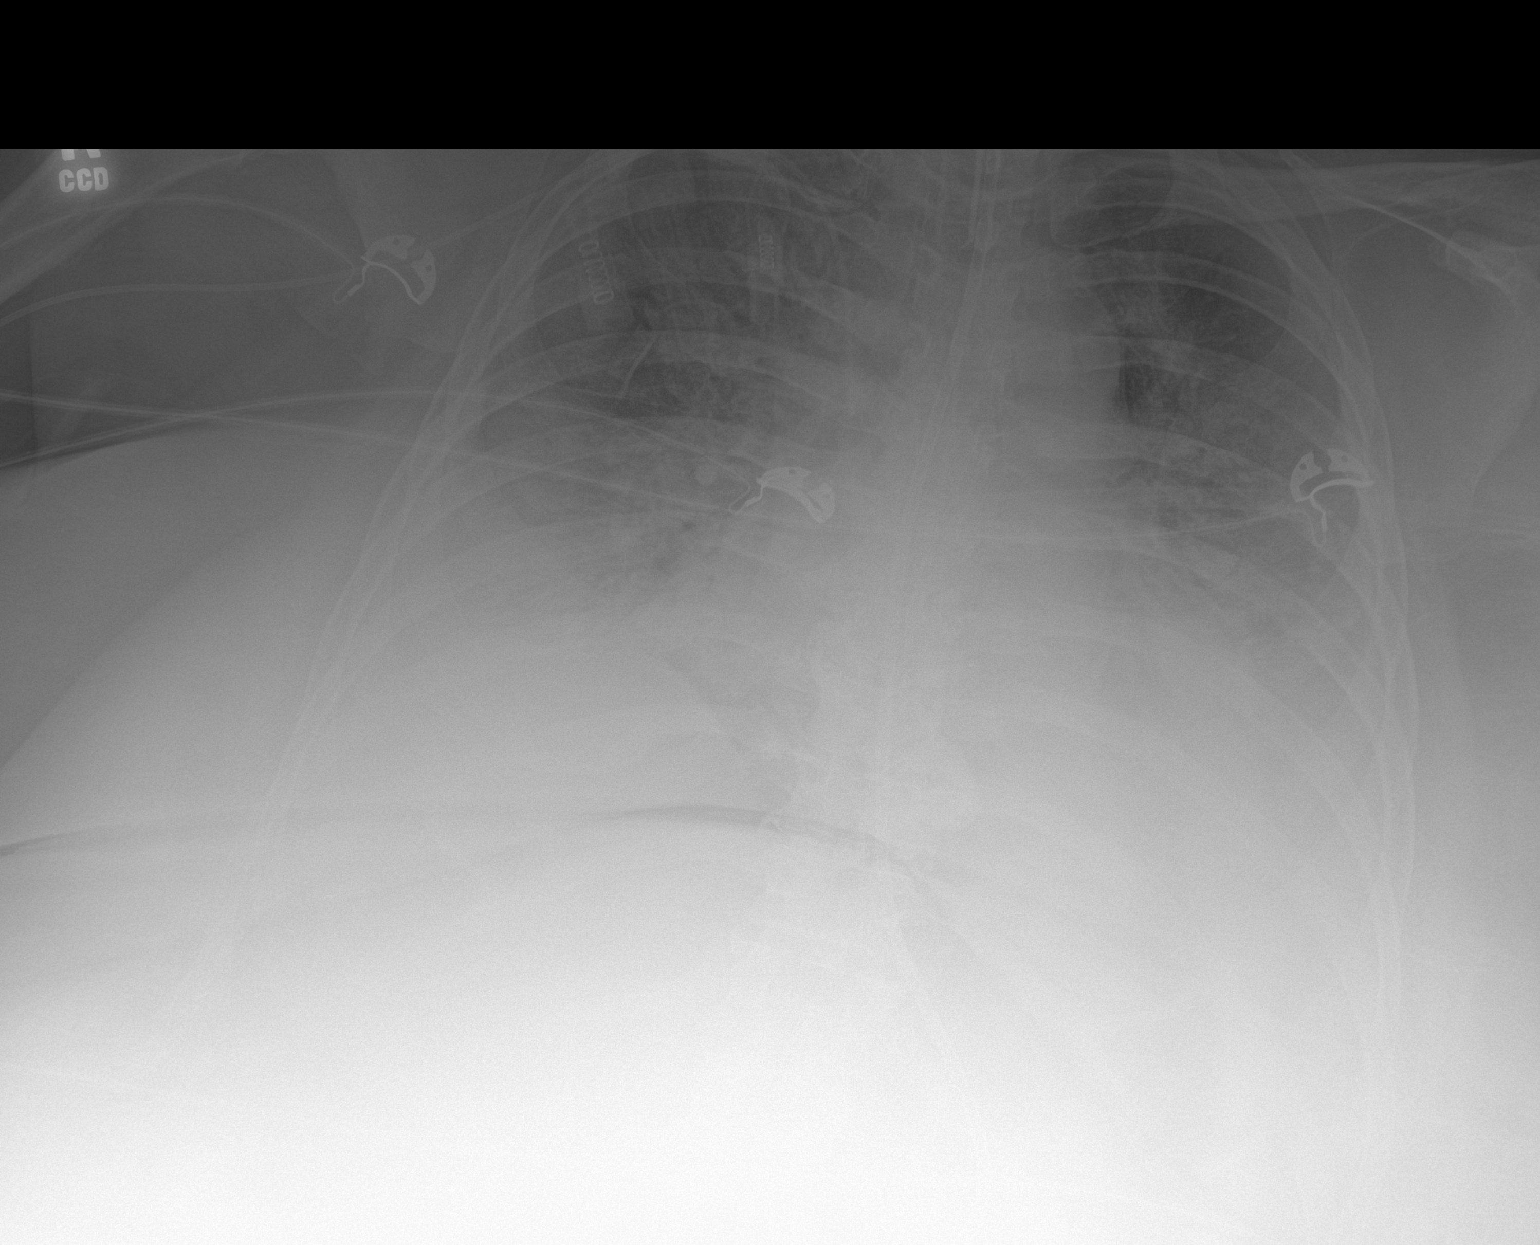

[1 of 1 positions shown; findings below may reference images not displayed]

FINDINGS: The cardiomediastinal silhouette is unchanged and enlarged in
contour.He remains partially obscured. ETT tip terminates 2.4 cm
above the carina. The enteric tube courses through the chest to the
abdomen beyond the field-of-view. RIGHT upper extremity PICC tip
terminates over the superior cavoatrial junction. No pleural
effusion. No pneumothorax. Diffuse airspace opacities bilaterally,
similar to minimally improved in comparison to prior. Visualized
abdomen is unremarkable. No acute osseous abnormality appreciated.
IMPRESSION: 1. Diffuse airspace opacities bilaterally, similar to minimally
improved in comparison to prior and consistent with the sequela of
[id number] infection.
2. Stable support apparatus.

## 2021-09-04 IMAGING — DX DG CHEST 1V
1 series · 1 of 1 positions shown · non-contrast
Comparison: 01/13/2020

CLINICAL DATA: Endotracheal tube position

EXAM:
CHEST  1 VIEW

[chest ap]
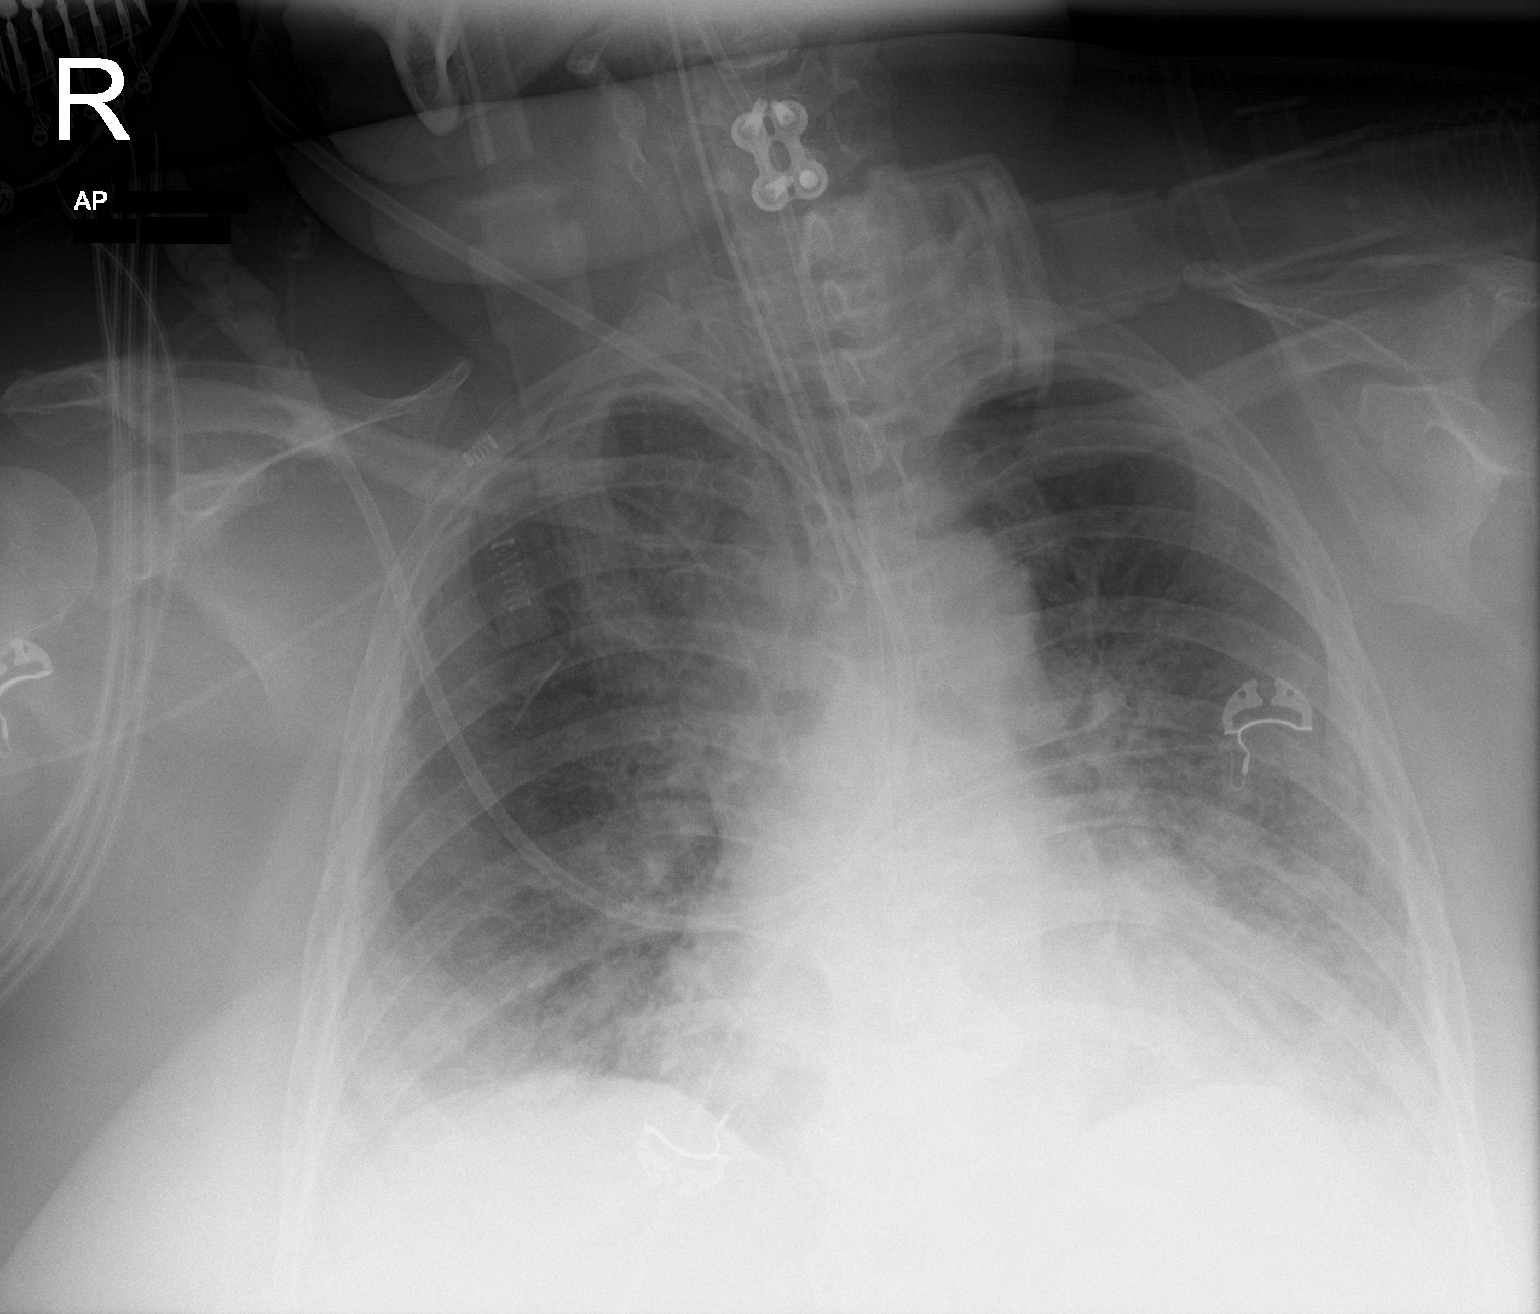

[1 of 1 positions shown; findings below may reference images not displayed]

FINDINGS: Endotracheal tube tip is at the level of the clavicular heads,
unchanged. There is improved lucency of the lungs following patient
rotation.
IMPRESSION: Endotracheal tube tip at the level of the clavicular heads.
Increased lucency of the lungs following patient rotation.

## 2021-09-04 IMAGING — DX DG CHEST 1V PORT
1 series · 1 of 1 positions shown · non-contrast
Comparison: 01/12/2020

CLINICAL DATA: Respiratory failure

EXAM:
PORTABLE CHEST 1 VIEW

[chest ap]
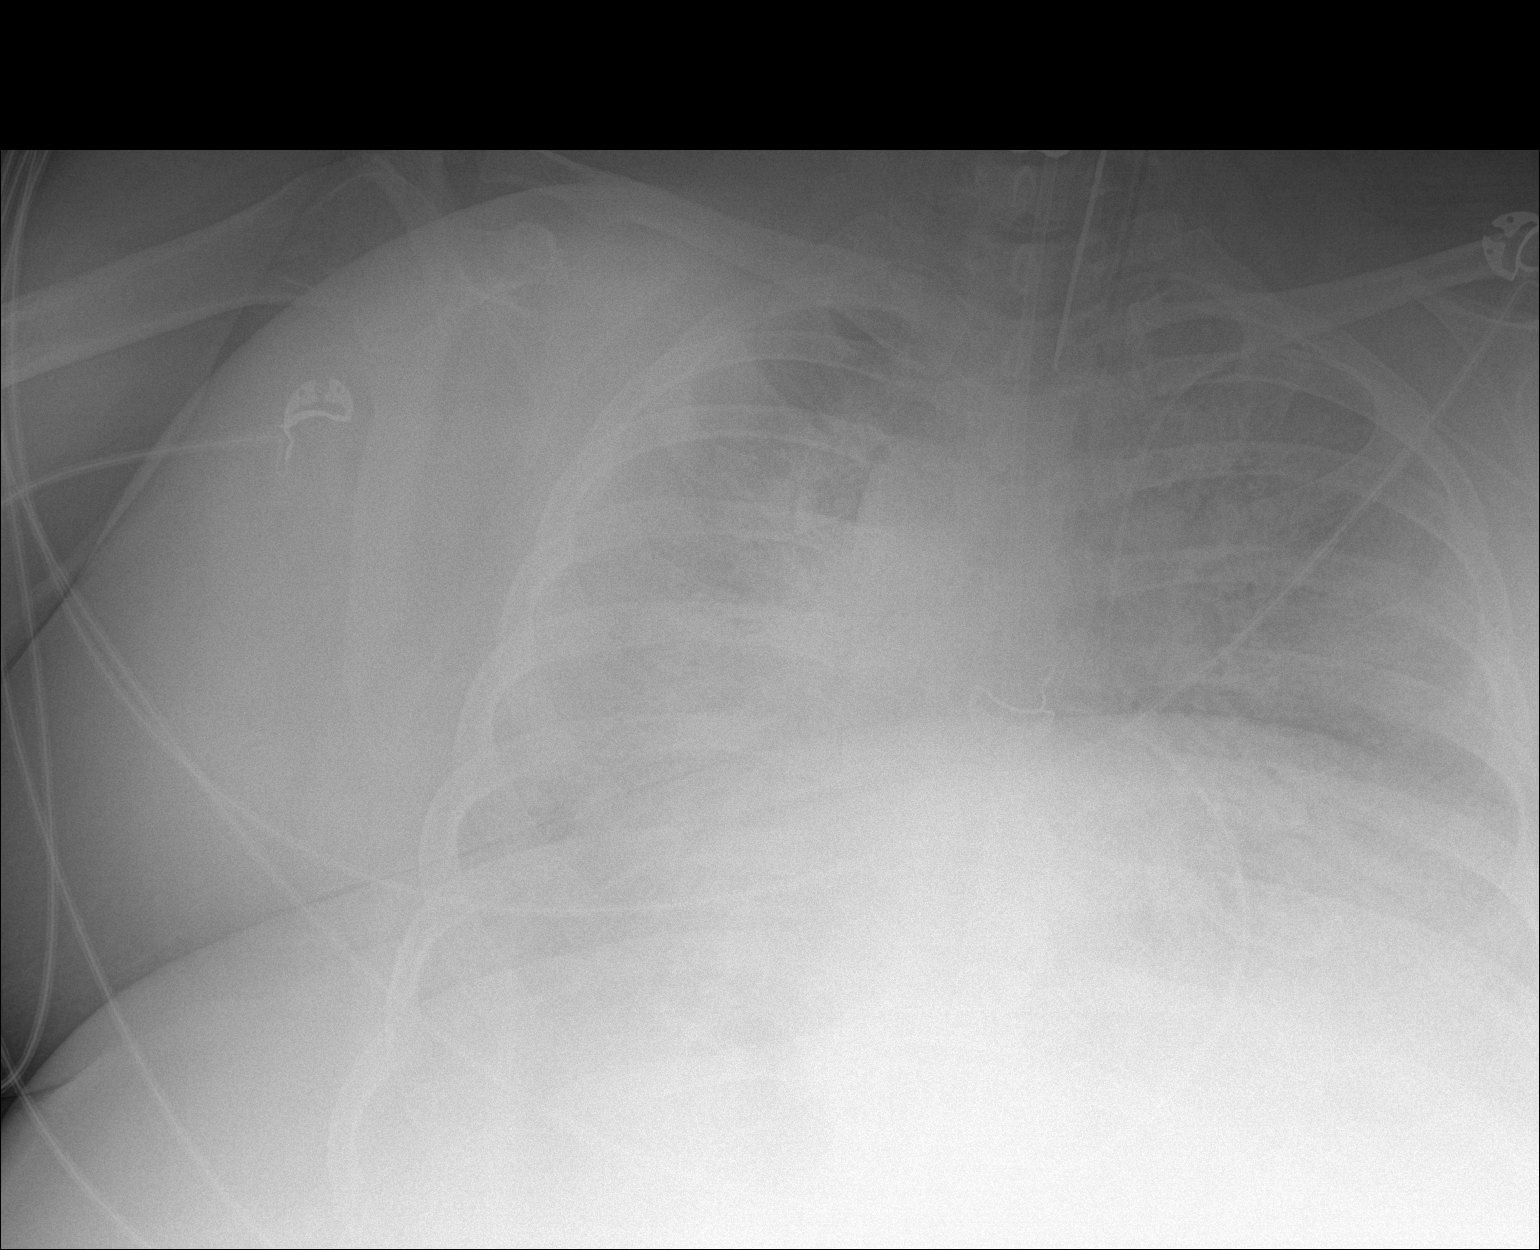

[1 of 1 positions shown; findings below may reference images not displayed]

FINDINGS: Endotracheal tube tip is at the level of the clavicular heads.
Esophageal tube is poorly visualized on the current study. Bilateral
airspace opacities are unchanged.
IMPRESSION: Unchanged bilateral airspace opacities

## 2021-09-04 IMAGING — DX DG ABDOMEN 1V
1 series · 1 of 1 positions shown · non-contrast
Comparison: 01/10/2020

CLINICAL DATA: Nasogastric tube placement

EXAM:
ABDOMEN - 1 VIEW

[abdomen kub]
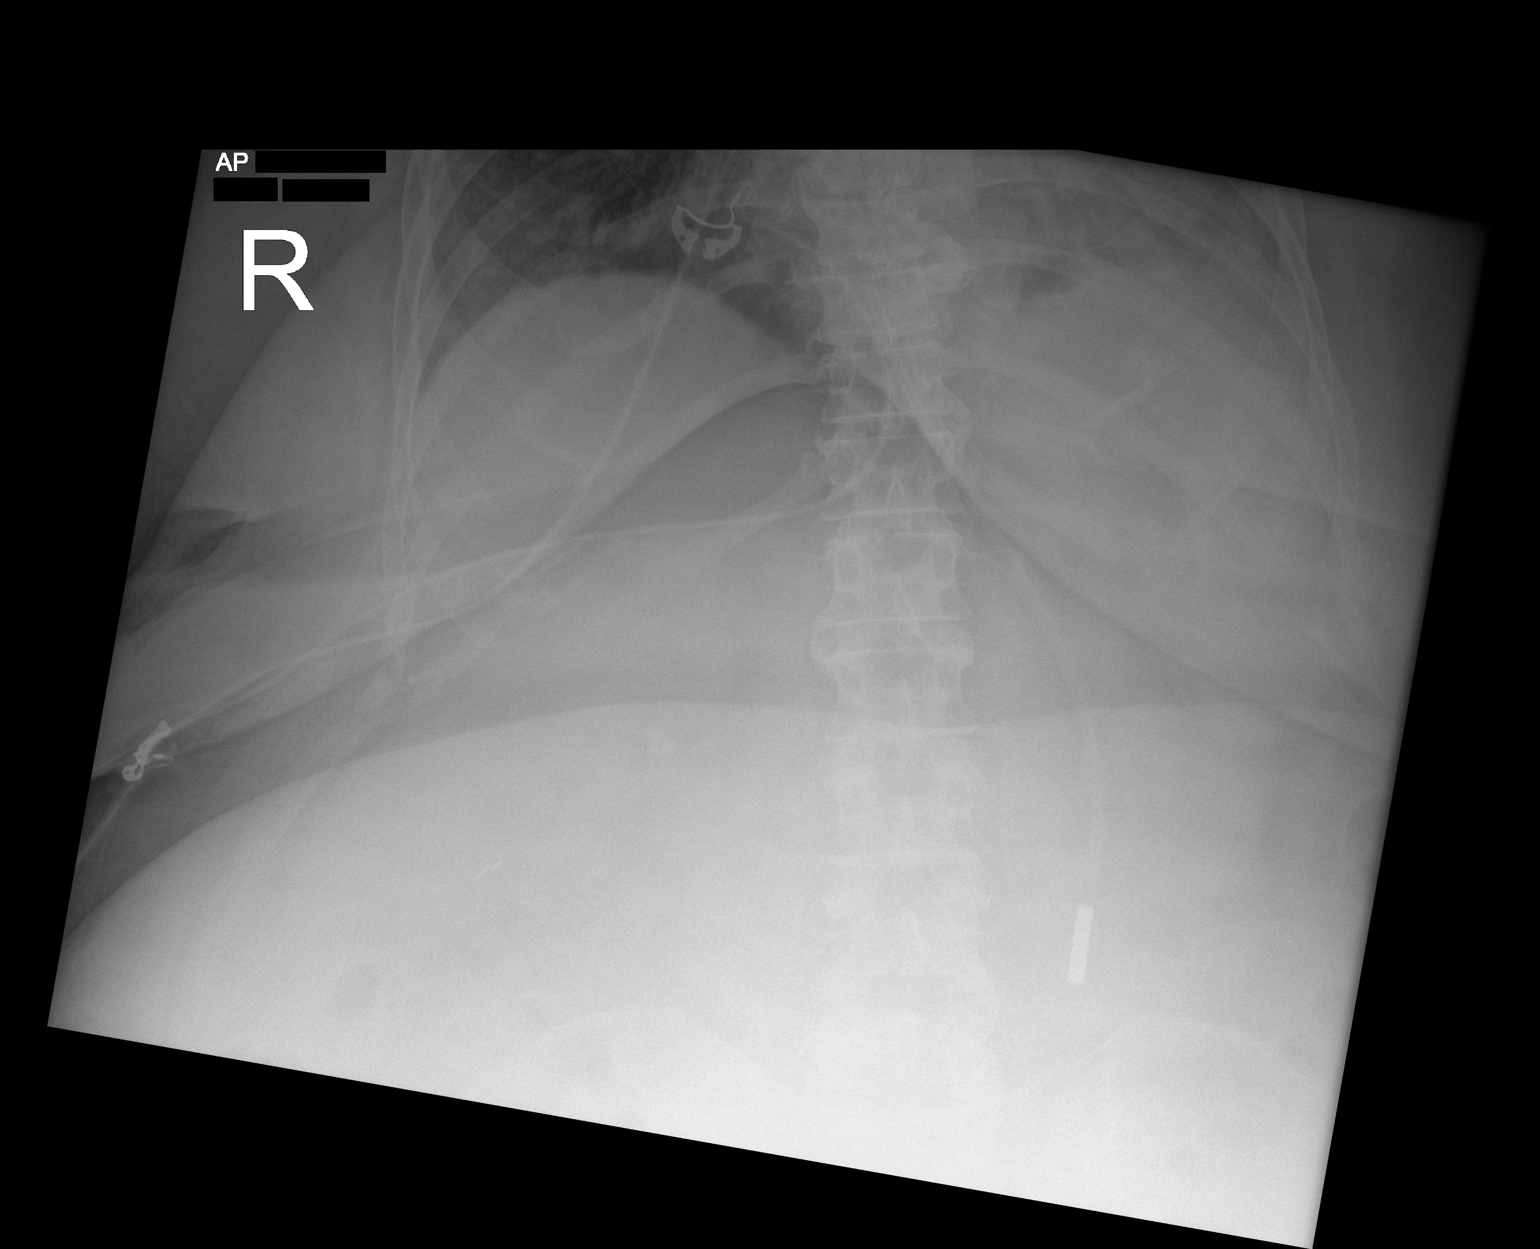

[1 of 1 positions shown; findings below may reference images not displayed]

FINDINGS: Tip of the weighted enteric tube projects within the left mid
abdomen, likely in the stomach.
IMPRESSION: Enteric tube tip in the stomach.

## 2021-09-06 IMAGING — DX DG ABDOMEN 1V
1 series · 1 of 1 positions shown · non-contrast
Comparison: 01/13/2020 and earlier.

CLINICAL DATA: 54-year-old female enteric tube placement.

EXAM:
ABDOMEN - 1 VIEW

[abdomen kub]
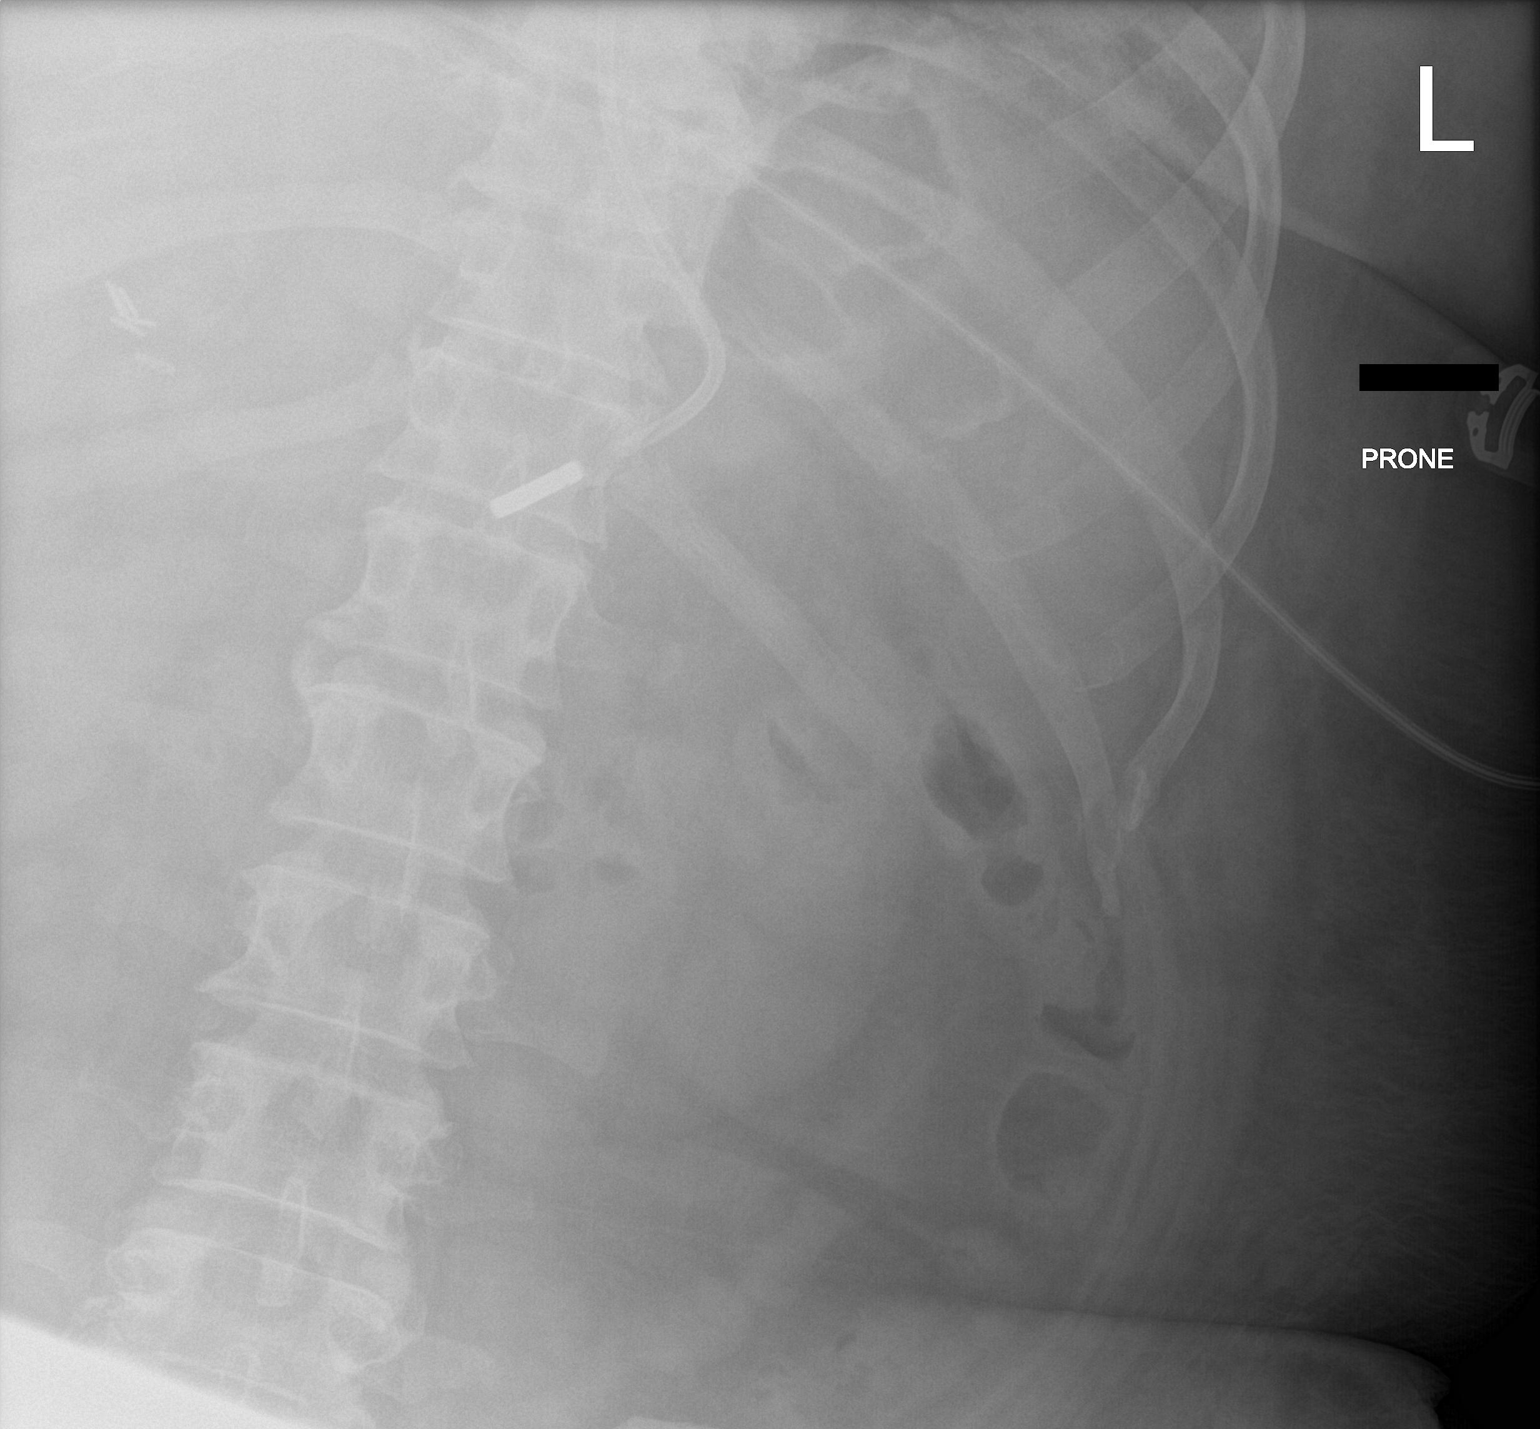

[1 of 1 positions shown; findings below may reference images not displayed]

FINDINGS: Portable AP prone view at [id number] hours. Enteric feeding tube tip is at
the level of the distal gastric body, projecting in the midline.
Paucity of bowel gas. Stable cholecystectomy clips. No acute osseous
abnormality identified.
IMPRESSION: Enteric feeding tube tip at the level of the distal gastric body.
Paucity of bowel gas.

## 2021-09-06 IMAGING — DX DG CHEST 1V PORT
1 series · 1 of 1 positions shown · non-contrast
Comparison: Earlier today.

CLINICAL DATA: ECMO.

EXAM:
PORTABLE CHEST 1 VIEW

[chest]
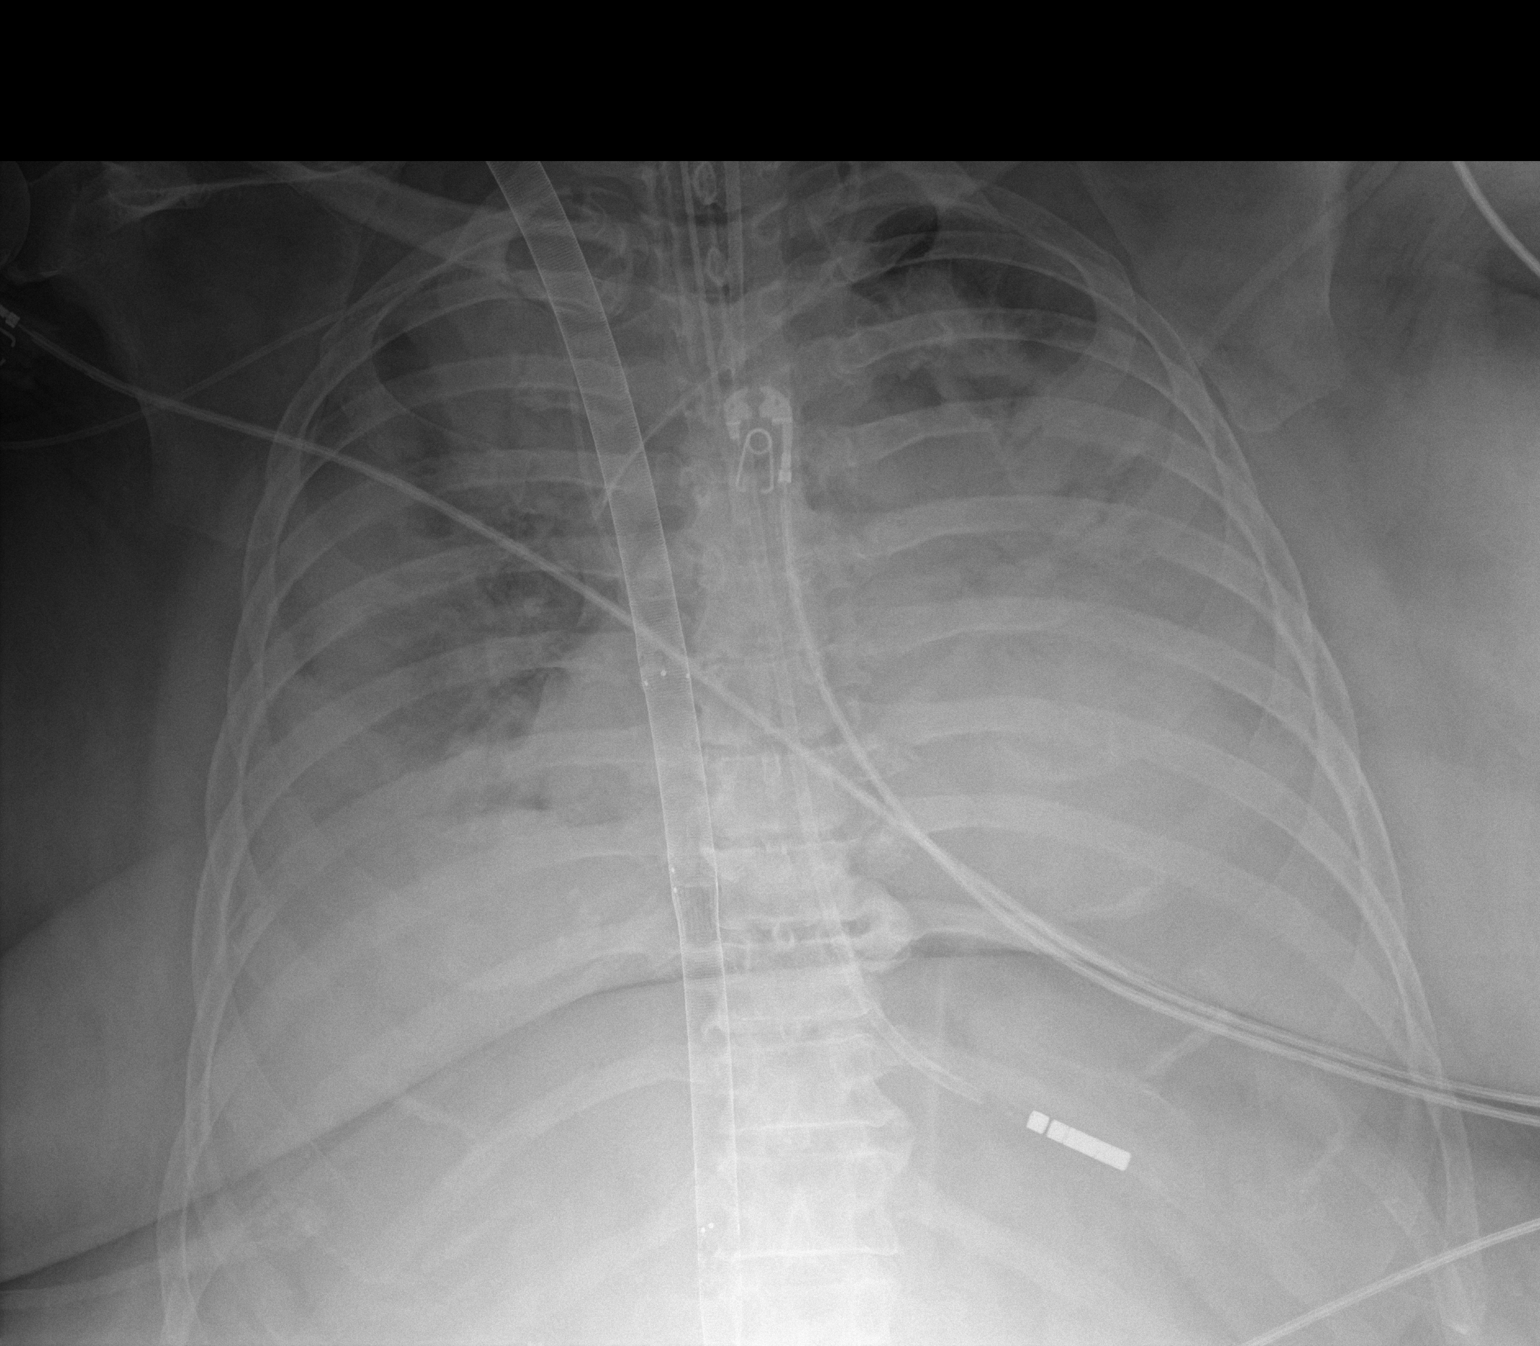

[1 of 1 positions shown; findings below may reference images not displayed]

FINDINGS: Interval right jugular ECMO catheter extending into the mid to lower
abdomen, with its tip not included. Feeding tube tip in the mid
stomach. Endotracheal tube tip 1.2 cm above the carina. Left jugular
catheter tip in the superior vena cava. Right PICC tip in the region
of the superior cavoatrial junction.

Extensive dense bilateral airspace opacity with mild improvement.
Interval visualization of the right heart border without
visualization of the left heart borders. Mild thoracic spine
degenerative changes.
IMPRESSION: 1. Interval right jugular ECMO catheter extending into the mid to
lower abdomen, with its tip not included.
2. Extensive dense bilateral pneumonia for alveolar edema with mild
improvement.
3. Endotracheal tube tip 1.2 cm above the carina. This could be
retracted 3 cm for better positioning.

These results will be called to the ordering clinician or
representative by the Radiologist Assistant, and communication
documented in the PACS or [REDACTED].

## 2021-09-07 IMAGING — DX DG CHEST 1V PORT
1 series · 1 of 1 positions shown · non-contrast
Comparison: January 15, 2020.

CLINICAL DATA: Hypoxia

EXAM:
PORTABLE CHEST 1 VIEW

[chest]
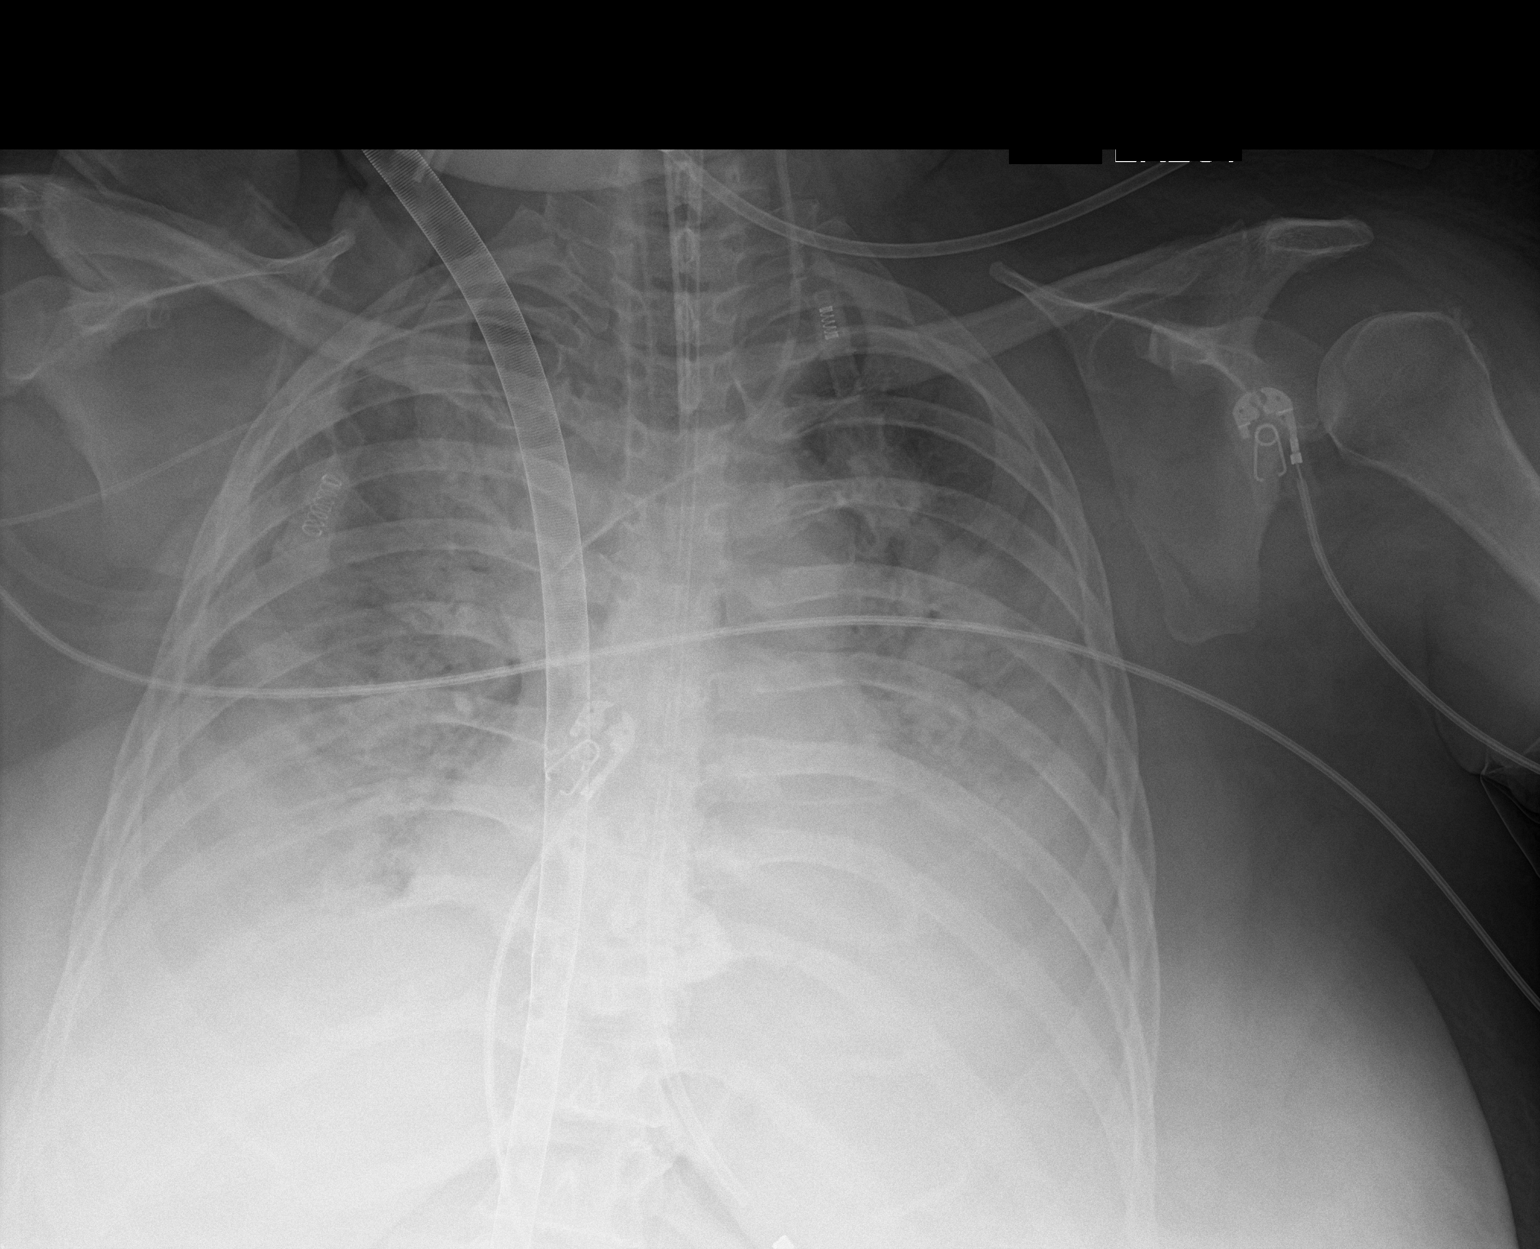

[1 of 1 positions shown; findings below may reference images not displayed]

FINDINGS: Endotracheal tube tip is 3.6 cm above the carina. ECMO catheter tip
is below the diaphragm. Feeding tube tip is below the diaphragm.
Central catheter tip is in the superior vena cava. No pneumothorax.
There are pleural effusions bilaterally with multifocal airspace
opacity, stable. Heart is enlarged, stable. No adenopathy
appreciable. Postoperative change noted in the lower cervical
region.
IMPRESSION: Tube and catheter positions as described without pneumothorax.
Pleural effusions bilaterally with widespread airspace consolidation
persists. Stable cardiomegaly.

## 2021-09-07 IMAGING — DX DG CHEST 1V PORT
1 series · 1 of 1 positions shown · non-contrast
Comparison: 01/16/2020

CLINICAL DATA: ECMO

EXAM:
PORTABLE CHEST 1 VIEW

[chest ap]
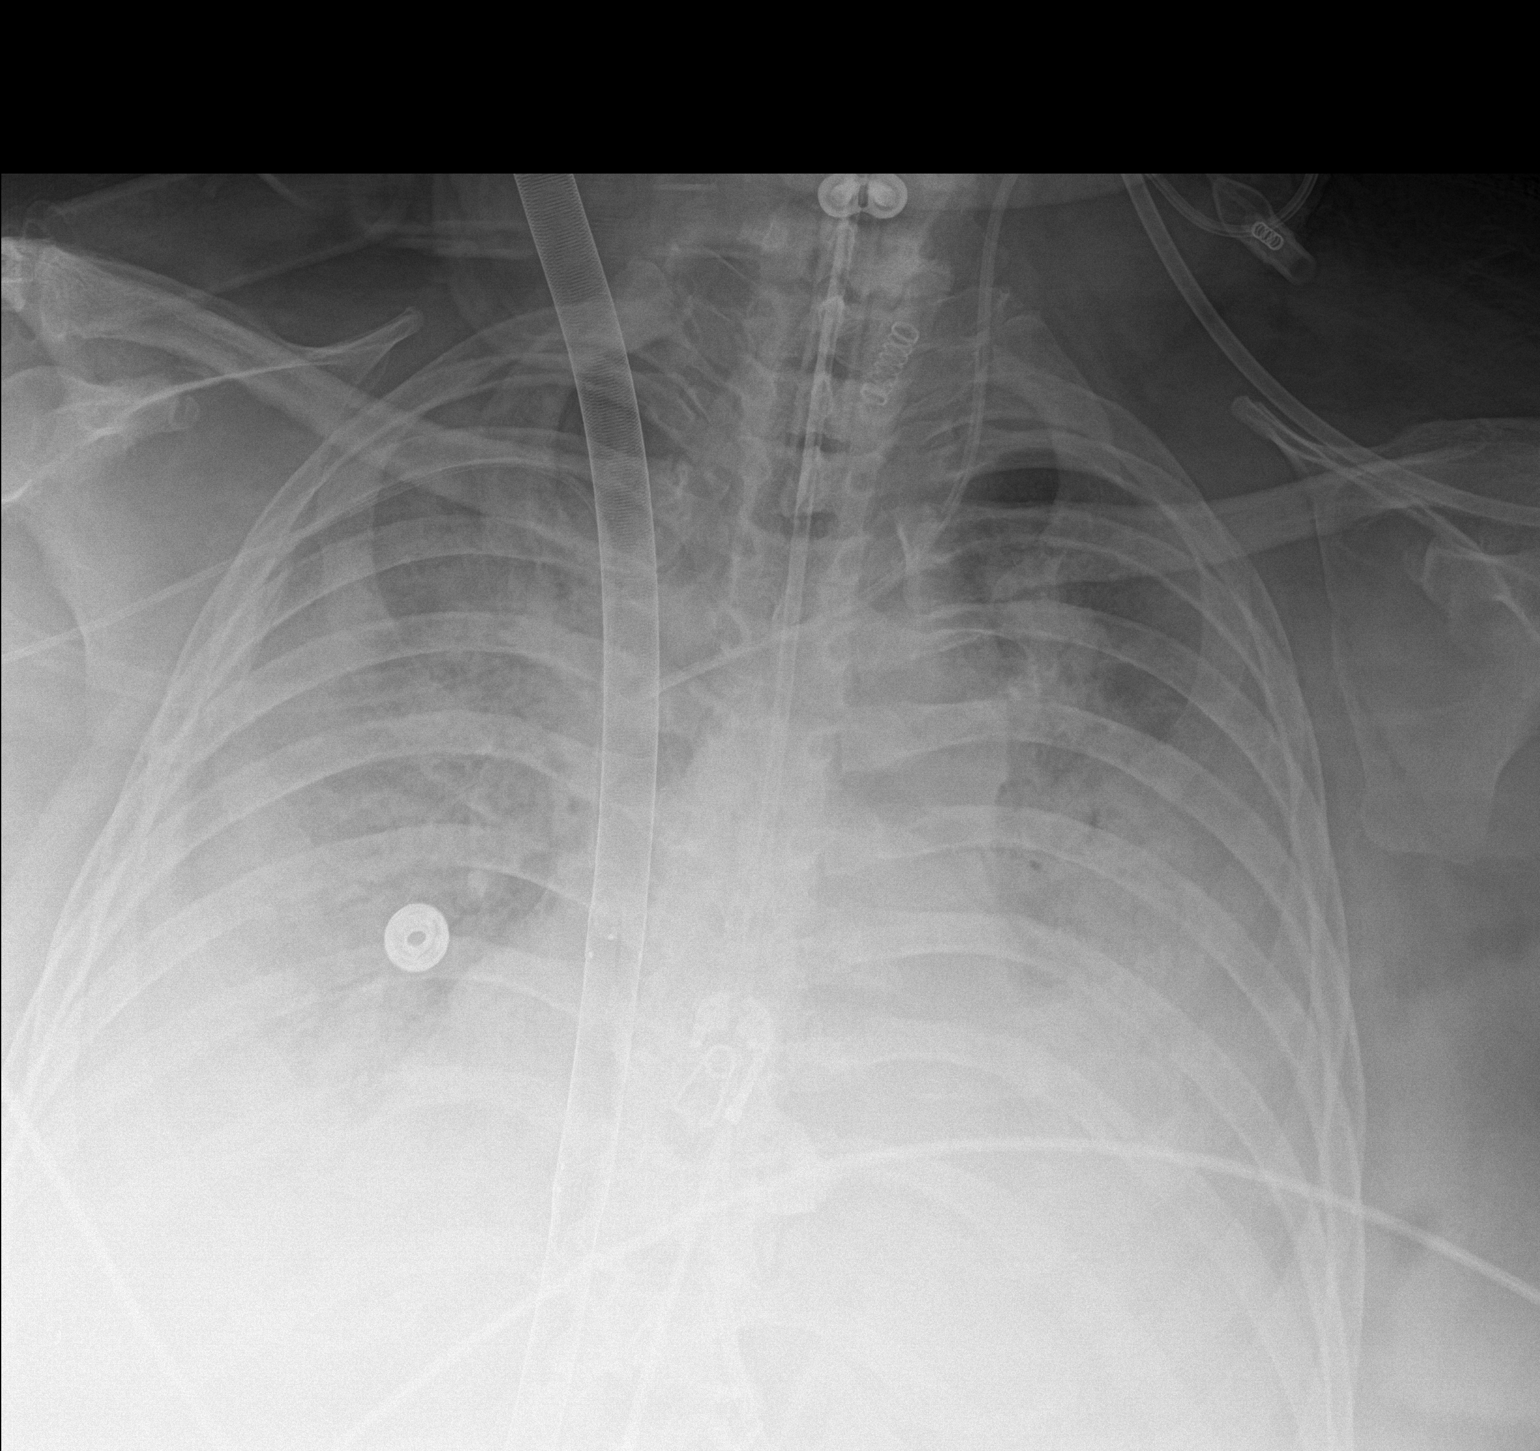

[1 of 1 positions shown; findings below may reference images not displayed]

FINDINGS: Support devices are stable. Diffuse airspace disease throughout the
lungs, stable or slightly worsening since prior study. Suspect
layering effusions. Heart is mildly enlarged. No acute bony
abnormality.
IMPRESSION: Severe diffuse bilateral airspace disease, stable or slightly
worsening.

Suspect layering effusions.

## 2021-09-07 IMAGING — DX DG CHEST 1V PORT
1 series · 1 of 1 positions shown · non-contrast
Comparison: 01/16/2020

CLINICAL DATA: Cardio respiratory failure

EXAM:
PORTABLE CHEST 1 VIEW

[chest]
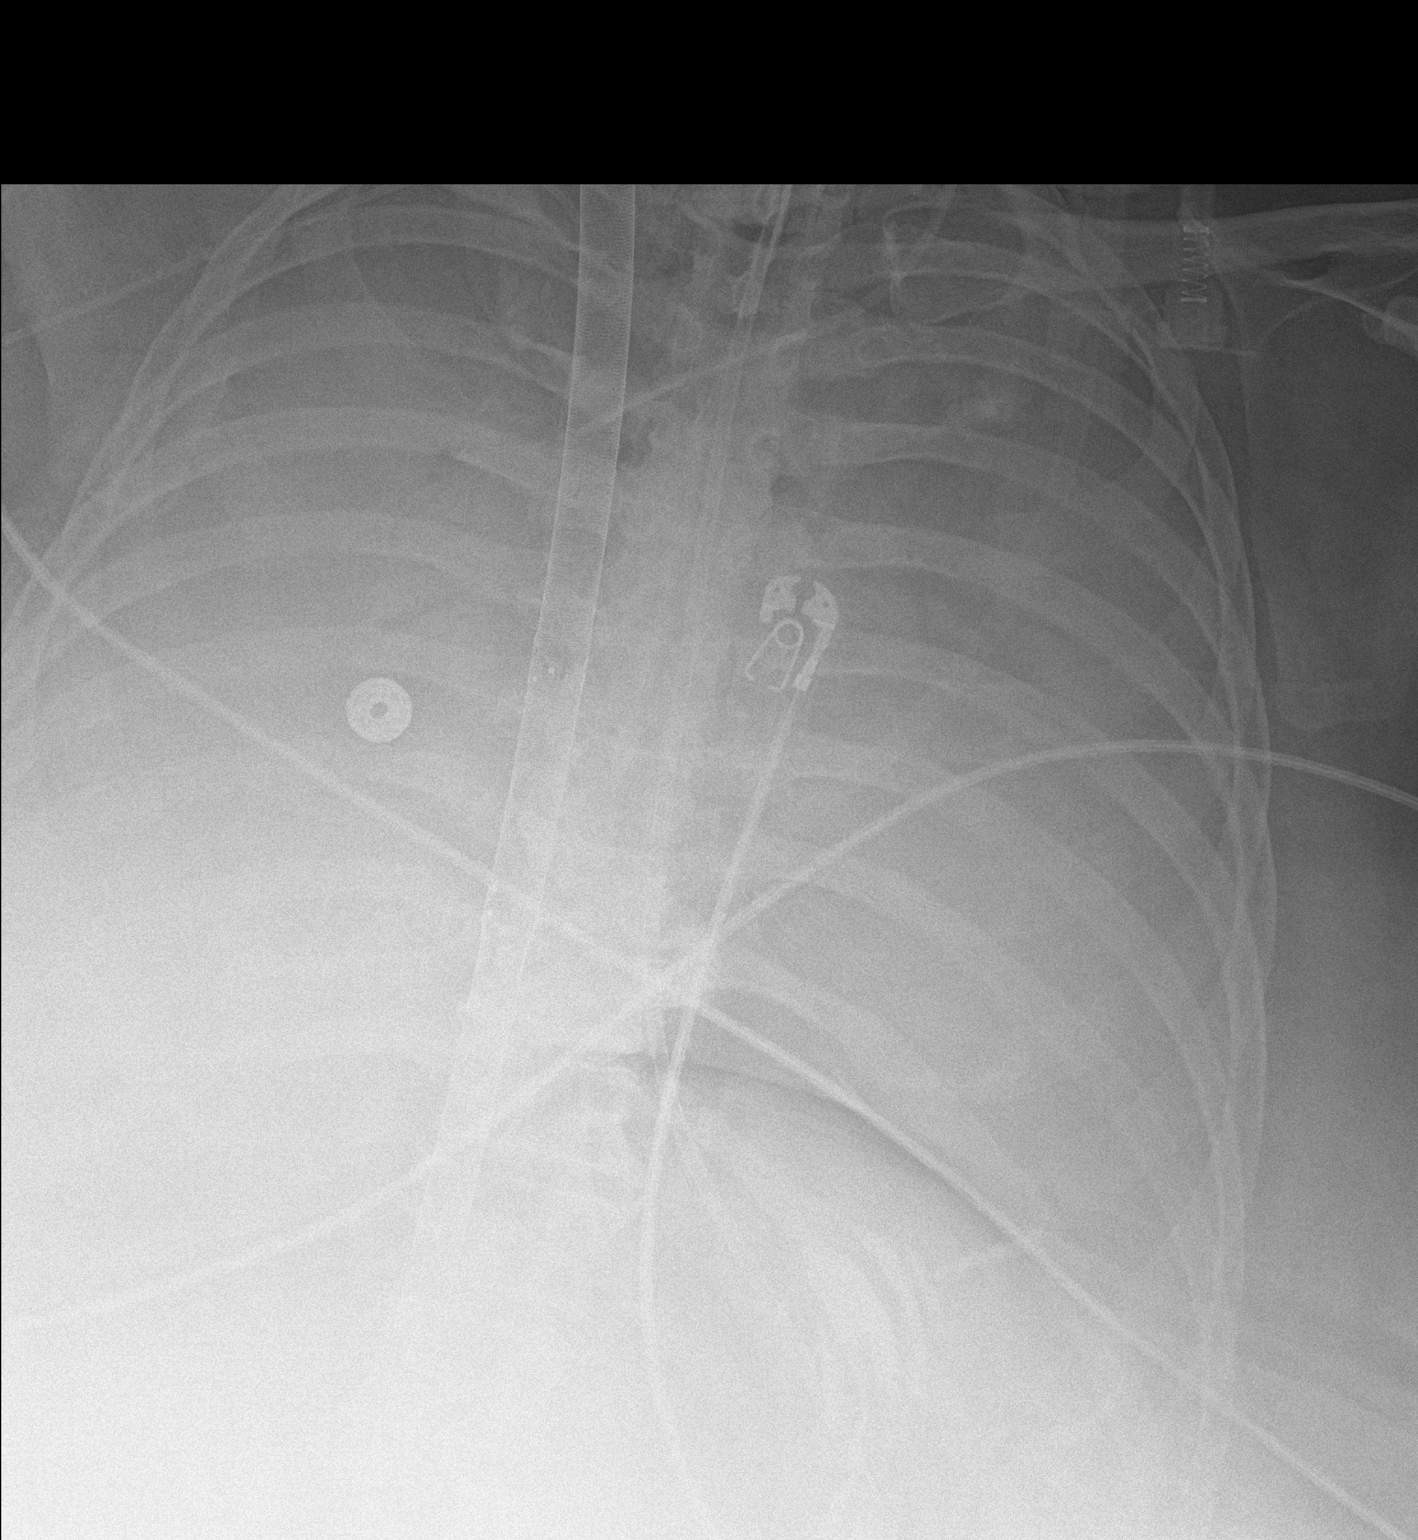

[1 of 1 positions shown; findings below may reference images not displayed]

FINDINGS: Support devices including ECMO are stable. Near complete
opacification of both lungs, worsening since prior study. Difficult
to visualize the heart.
IMPRESSION: Worsening aeration with near complete opacification of both lungs.

## 2021-09-08 IMAGING — DX DG CHEST 1V PORT
1 series · 1 of 1 positions shown · non-contrast
Comparison: Earlier same day

CLINICAL DATA: COVID positive on ECMO

EXAM:
PORTABLE CHEST 1 VIEW

[chest]
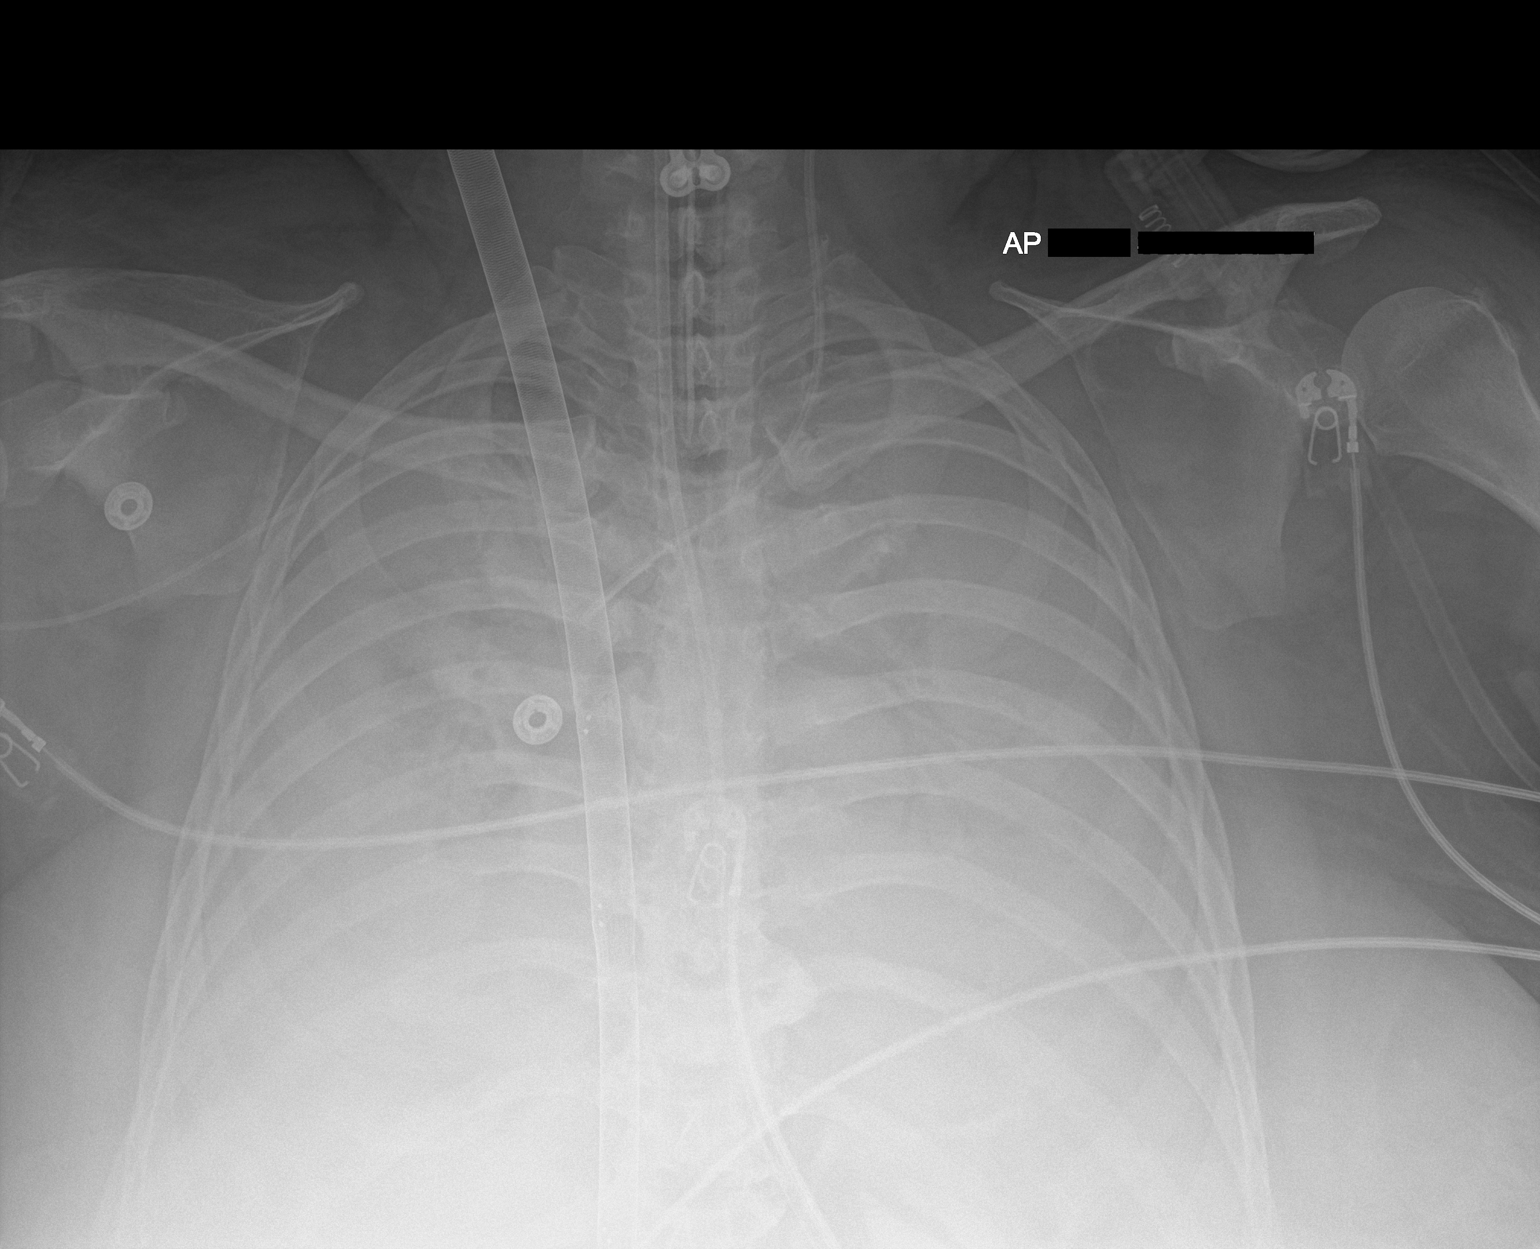

[1 of 1 positions shown; findings below may reference images not displayed]

FINDINGS: Endotracheal tube, left IJ central line, right PICC line, and
enteric tube are again identified. ECMO cannula is also again seen.

Persistent diffuse opacification of bilateral lungs.
Cardiomediastinal contours are obscured.
IMPRESSION: Lines and tubes as above. Similar appearance of diffuse bilateral
pulmonary opacification.

## 2021-09-08 IMAGING — DX DG CHEST 1V PORT
1 series · 1 of 1 positions shown · non-contrast
Comparison: 01/16/2020

CLINICAL DATA: [id number] positivity

EXAM:
PORTABLE CHEST 1 VIEW

[chest]
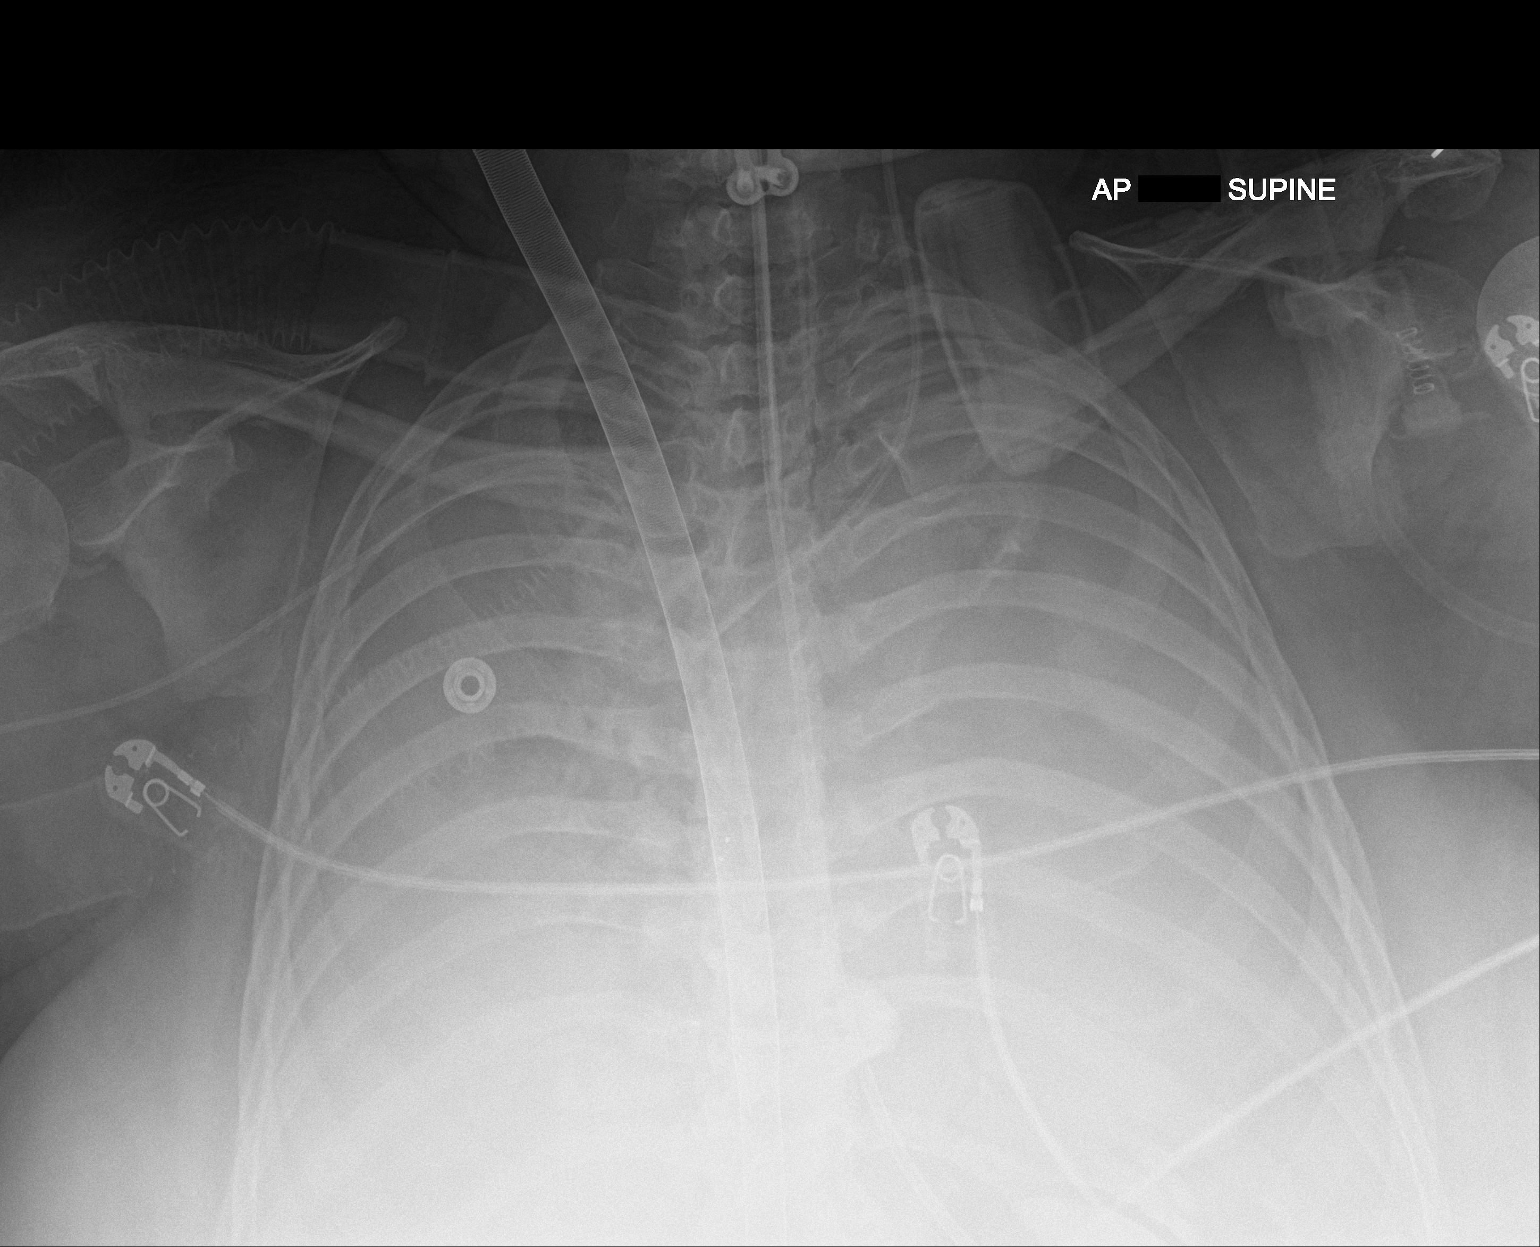

[1 of 1 positions shown; findings below may reference images not displayed]

FINDINGS: Diffuse airspace opacity is identified stable in appearance from the
prior exam. Feeding catheter, endotracheal tube, left jugular
central line and ECMO cannula are again noted and stable.
Right-sided PICC line is noted in satisfactory position.
IMPRESSION: Tubes and lines as described stable in appearance.

Diffuse opacification of lungs bilaterally with minimal aeration in
the central aspect of the right lung.

## 2021-09-10 IMAGING — CT CT CHEST-ABD-PELV W/ CM
2 of 5 series · 12 of 36 positions shown, 18 images · IV contrast (APPLIED)
Comparison: Abdomen and pelvis CT dated 05/10/2014, portable chest
obtained earlier today.

CLINICAL DATA: Sepsis. Leukocytosis. Respiratory failure. COVID
ARDS.

EXAM:
CT CHEST, ABDOMEN, AND PELVIS WITH CONTRAST
TECHNIQUE: Multidetector CT imaging of the chest, abdomen and pelvis was
performed following the standard protocol during bolus
administration of intravenous contrast.
CONTRAST:  100mL OMNIPAQUE IOHEXOL 350 MG/ML SOLN

[Series 3: cap 5.0 i31f 2 · axial · 0.77mm/px · z∈[+224,+738]mm · 9 of 127 slices shown, 15 images]
[im 12/127  mediastinal]
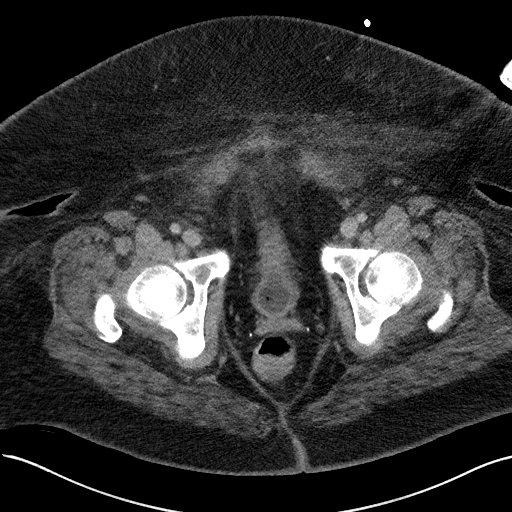
[im 12/127  bone]
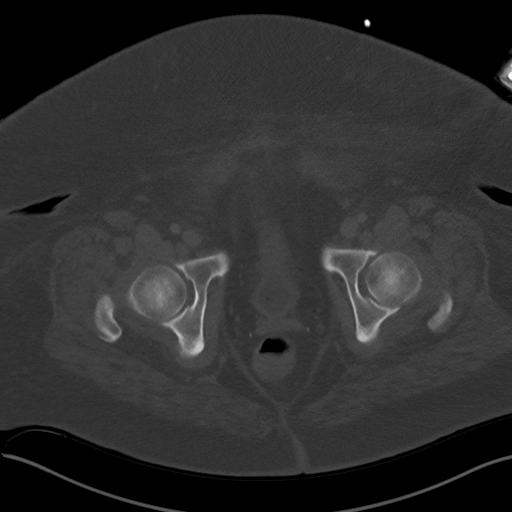
[im 23/127  mediastinal]
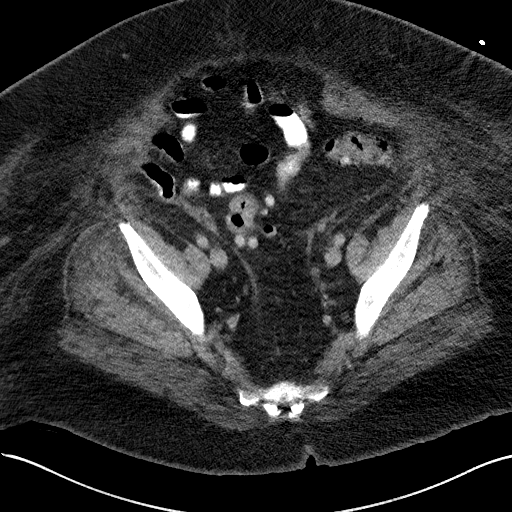
[im 35/127  mediastinal]
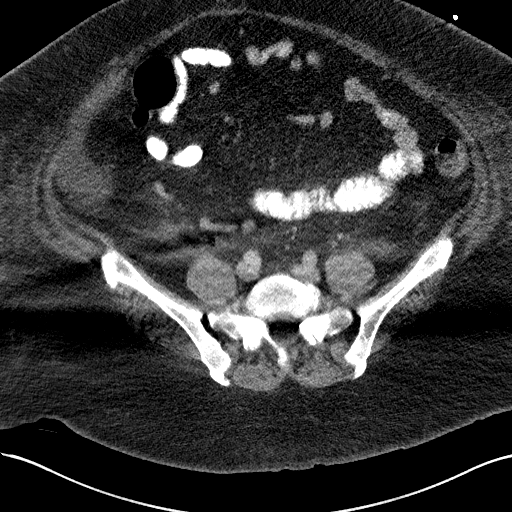
[im 46/127  mediastinal]
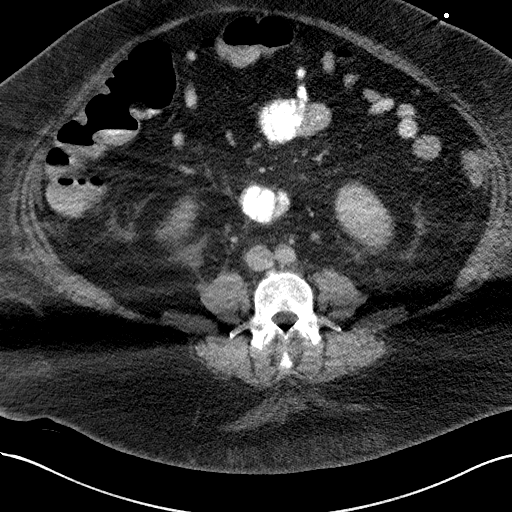
[im 69/127  mediastinal]
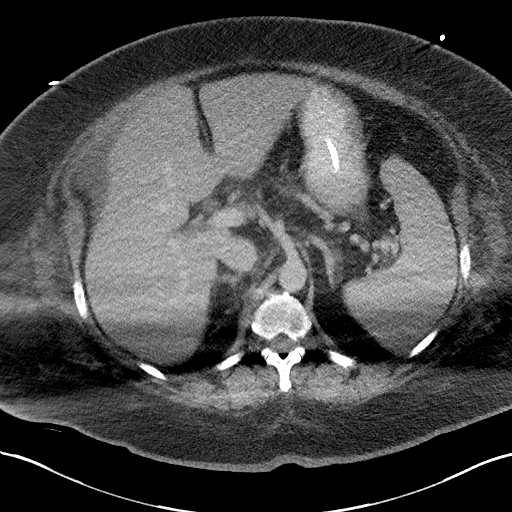
[im 81/127  mediastinal]
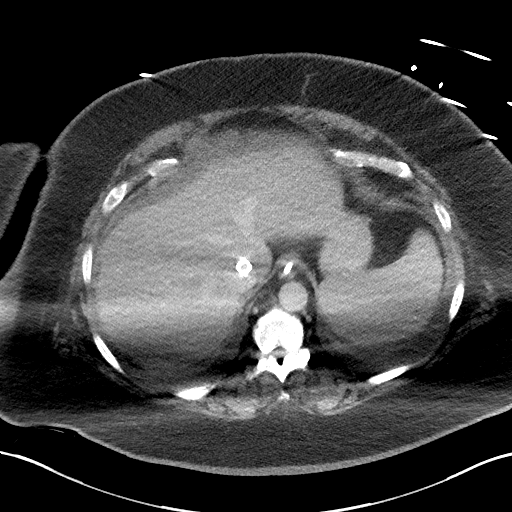
[im 81/127  lung]
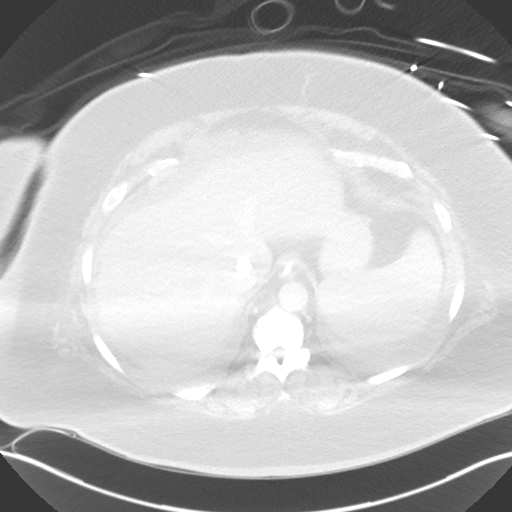
[im 92/127  mediastinal]
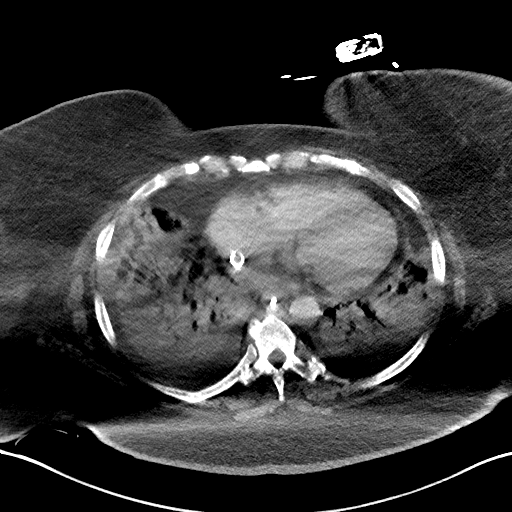
[im 92/127  lung]
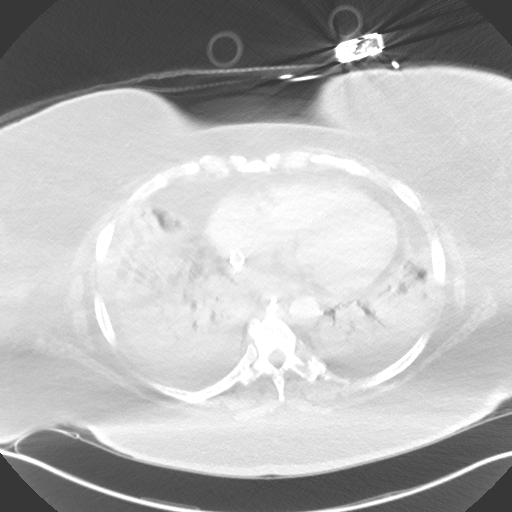
[im 104/127  mediastinal]
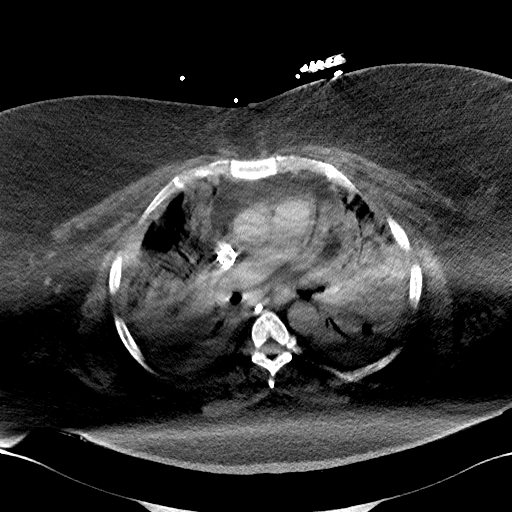
[im 104/127  lung]
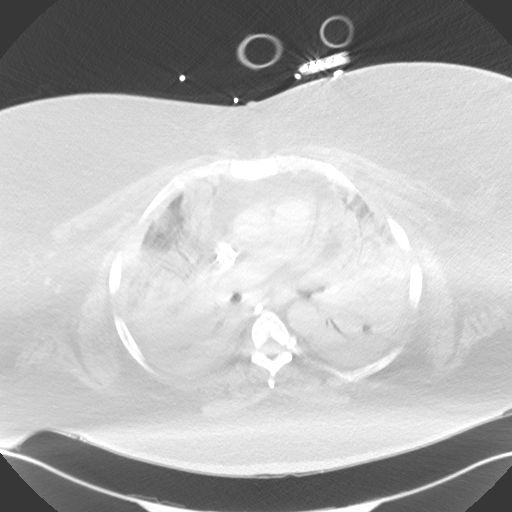
[im 115/127  mediastinal]
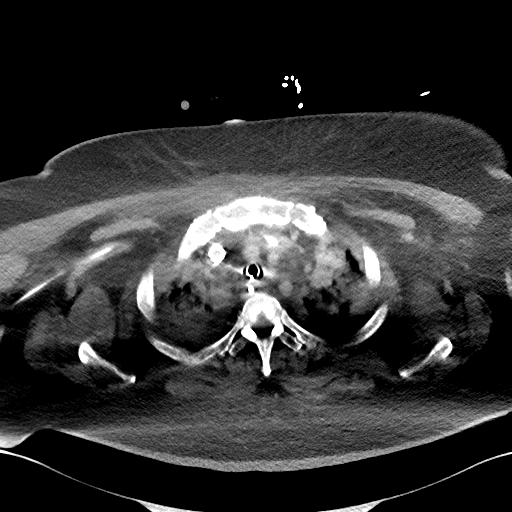
[im 115/127  lung]
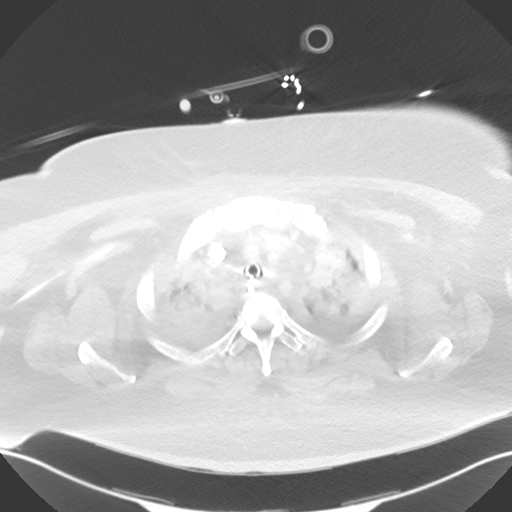
[im 115/127  bone]
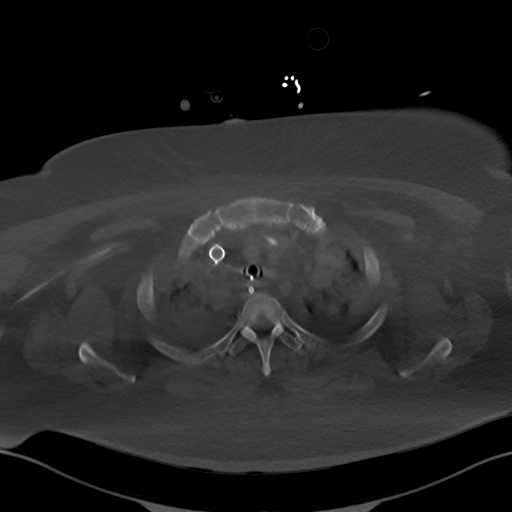

[Series 6: coronal · coronal · 1.03mm/px · 3 of 204 slices shown]
[im 41/204  mediastinal]
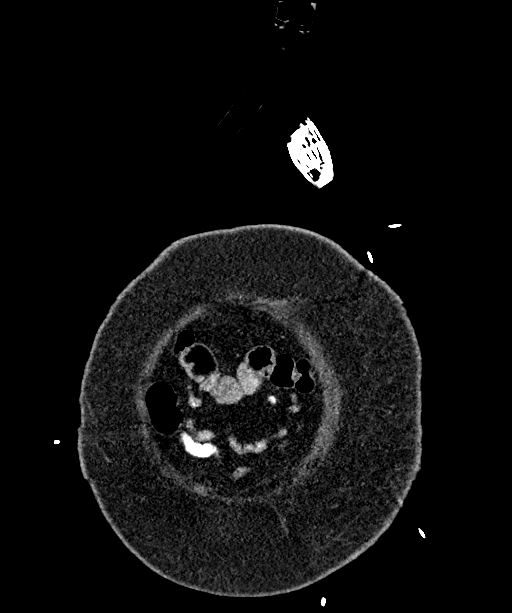
[im 82/204  mediastinal]
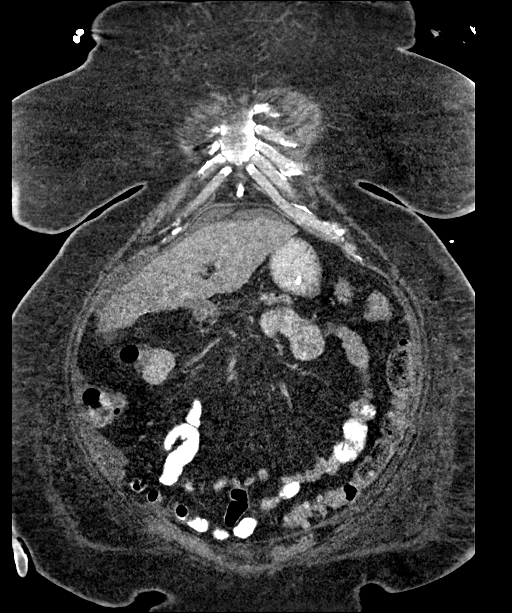
[im 122/204  mediastinal]
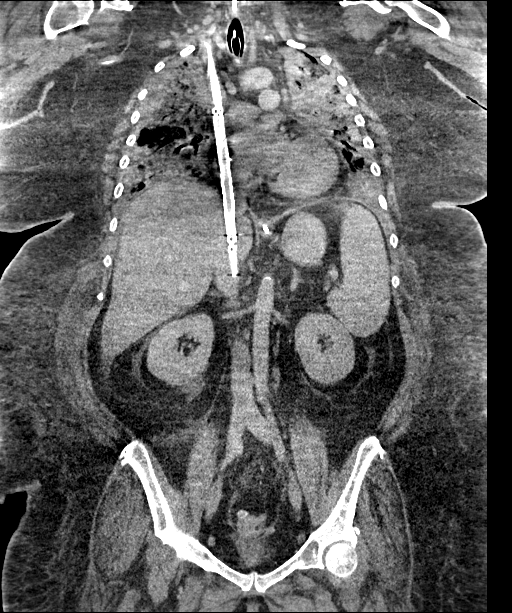

[12 of 36 positions shown; findings below may reference images not displayed]

FINDINGS: CT CHEST FINDINGS

Cardiovascular: The right jugular ECMO catheter extends through the
superior vena cava and into the inferior vena cava with its tip
above the level of the renal veins. Mildly enlarged heart. Minimal
pericardial fluid with a maximum thickness of 5 mm.

Mediastinum/Nodes: Endotracheal tube tip 1 cm above the carina.
Feeding tube extending into the stomach. Unremarkable included
thyroid gland. No enlarged lymph nodes.

Lungs/Pleura: Dense airspace opacity throughout both lungs with air
bronchograms. Small to moderate-sized right pleural effusion and
small left pleural effusion.

Musculoskeletal: Thoracic spine degenerative changes. Lower cervical
spine fixation hardware.

CT ABDOMEN PELVIS FINDINGS

Hepatobiliary: No focal liver abnormality is seen. Status post
cholecystectomy. No biliary dilatation.

Pancreas: Unremarkable. No pancreatic ductal dilatation or
surrounding inflammatory changes.

Spleen: Normal in size without focal abnormality.

Adrenals/Urinary Tract: Foley catheter in the urinary bladder with
no urine in the bladder. There is associated air in the bladder.
Normal appearing adrenal glands, kidneys and ureters.

Stomach/Bowel: Large number of sigmoid and distal descending colon
diverticula without evidence of diverticulitis. Normal appearing
appendix, small bowel and stomach. Feeding tube tip in the mid to
distal stomach. Rectal balloon catheter.

Vascular/Lymphatic: No significant vascular findings are present. No
enlarged abdominal or pelvic lymph nodes.

Reproductive: Status post hysterectomy. No adnexal masses.

Other: Mild bilateral subcutaneous edema. Small amount of free
peritoneal fluid.

Musculoskeletal: Lumbar spine degenerative changes.
IMPRESSION: 1. Dense airspace opacity throughout both lungs with air
bronchograms, compatible with the clinical diagnosis of COVID ARDS.
Dense bilateral pneumonia could also have this appearance.
2. Small to moderate-sized right pleural effusion and small left
pleural effusion.
3. Small amount of ascites.
4. Colonic diverticulosis.

## 2021-09-10 IMAGING — DX DG CHEST 1V PORT
1 series · 1 of 1 positions shown · non-contrast
Comparison: January 18, 2020

CLINICAL DATA: Hypoxia

EXAM:
PORTABLE CHEST 1 VIEW

[chest ap]
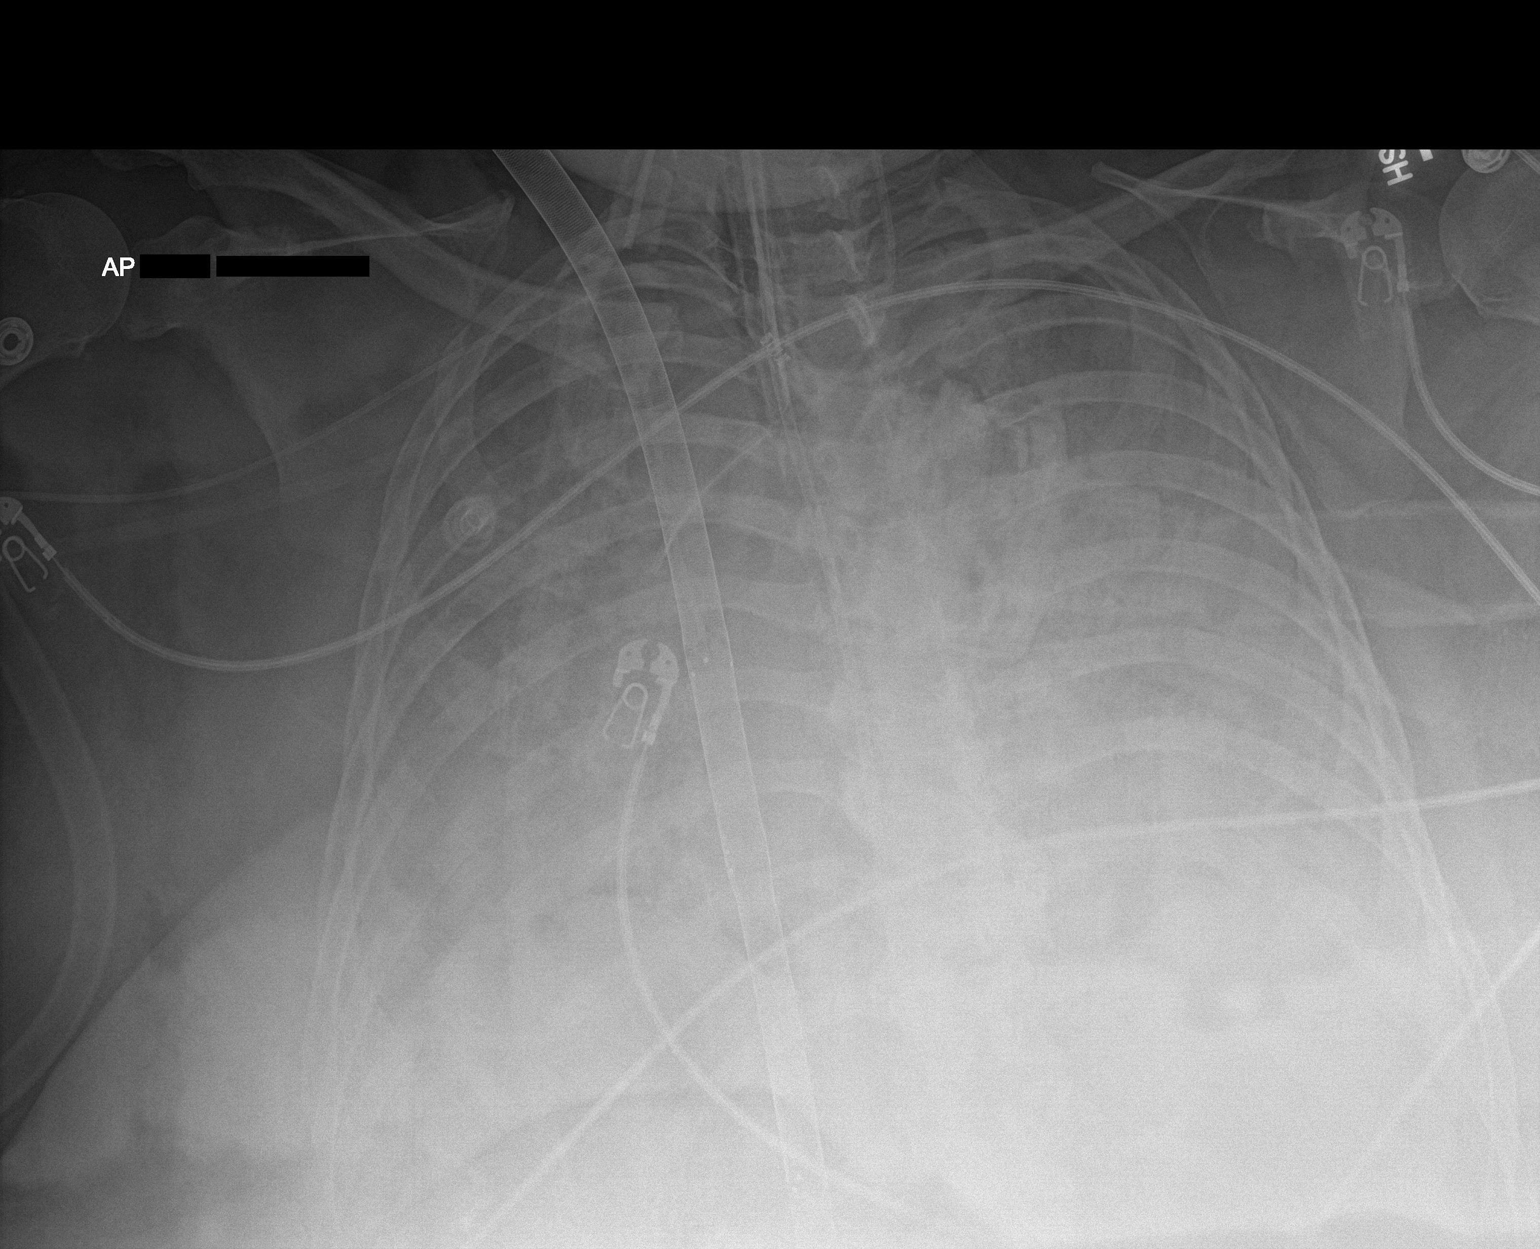

[1 of 1 positions shown; findings below may reference images not displayed]

FINDINGS: Endotracheal tube tip is 2.8 cm above the carina. Feeding tube tip
is below the diaphragm. Central catheter tips are in the superior
vena cava. ECMO catheter extends below the diaphragm on the right.
No pneumothorax.

There is widespread airspace opacity bilaterally with underlying
pleural effusions. Heart is prominent, stable. Pulmonary vascularity
largely obscured by overlying airspace opacity. Postoperative change
noted in the lower cervical region.
IMPRESSION: Persistent widespread airspace opacity with underlying pleural
effusions. Tube and catheter positions as described without
pneumothorax. Stable cardiac silhouette.

## 2021-09-10 IMAGING — CT CT HEAD W/O CM
3 series · 15 of 47 positions shown, 18 images · non-contrast
Comparison: 01/06/2020

CLINICAL DATA: Mental status change, COVID positive, on ECMO

EXAM:
CT HEAD WITHOUT CONTRAST
TECHNIQUE: Contiguous axial images were obtained from the base of the skull
through the vertex without intravenous contrast.

[Series 3: head 5.0 h30s · axial · 0.52mm/px · z∈[+906,+1046]mm · 9 of 34 slices shown, 12 images]
[im 3/34  brain]
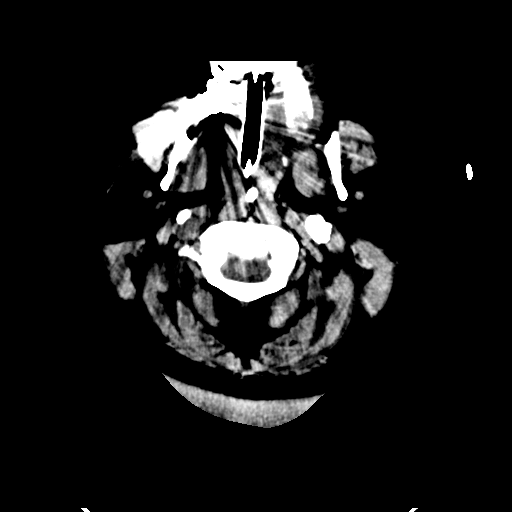
[im 3/34  bone]
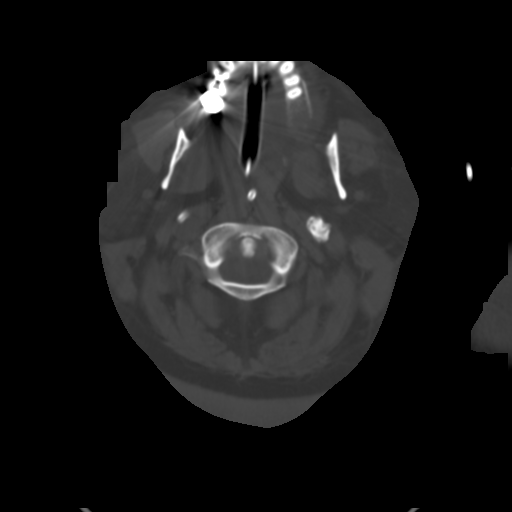
[im 6/34  brain]
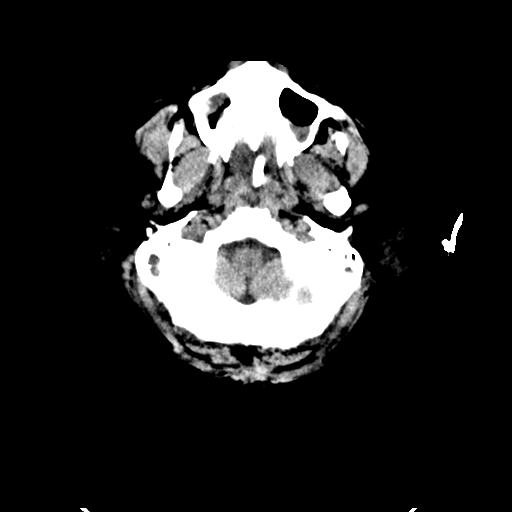
[im 10/34  brain]
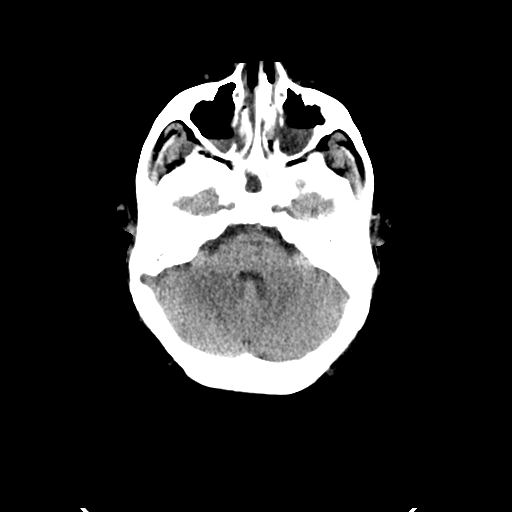
[im 13/34  brain]
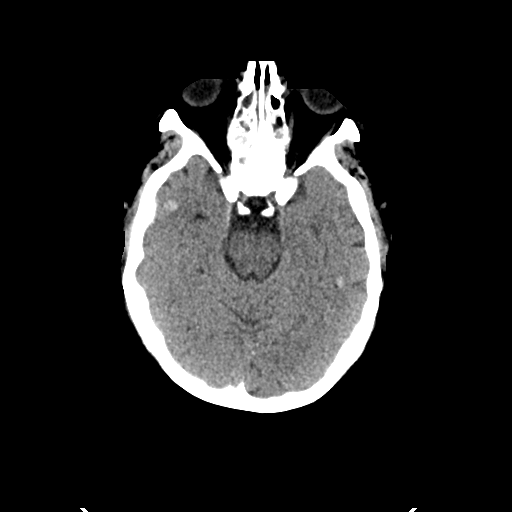
[im 18/34  brain]
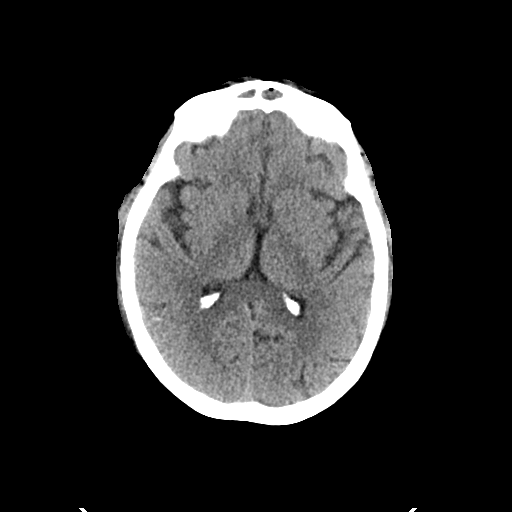
[im 18/34  bone]
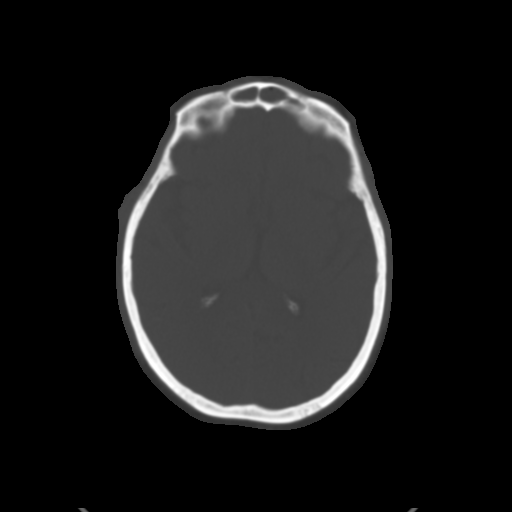
[im 21/34  brain]
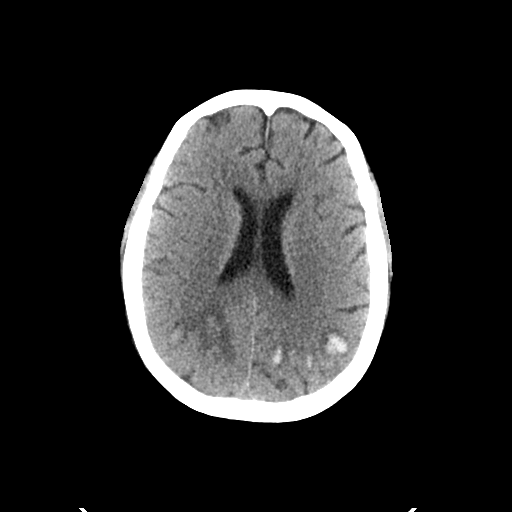
[im 24/34  brain]
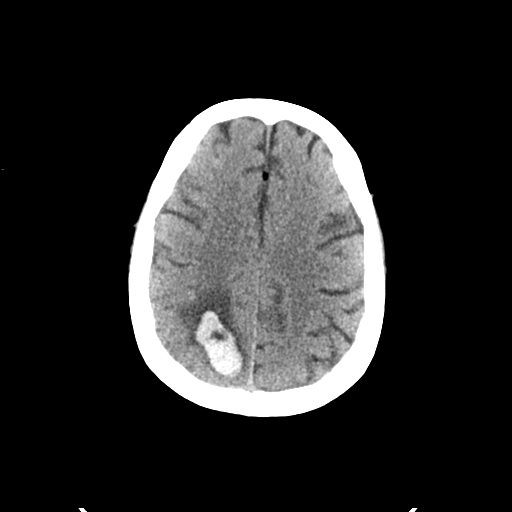
[im 28/34  brain]
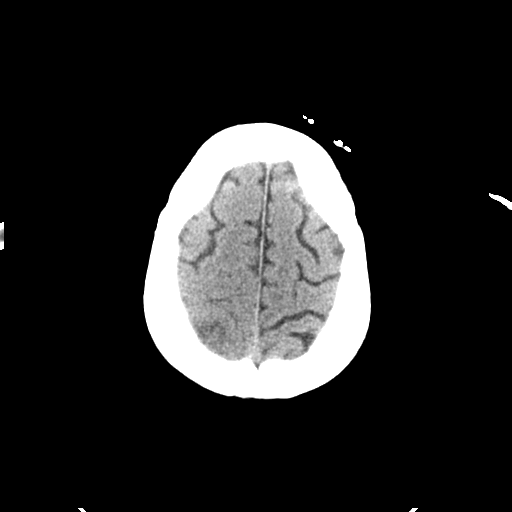
[im 31/34  brain]
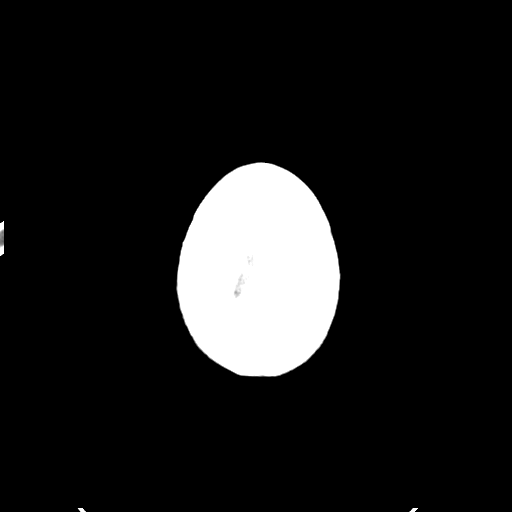
[im 31/34  bone]
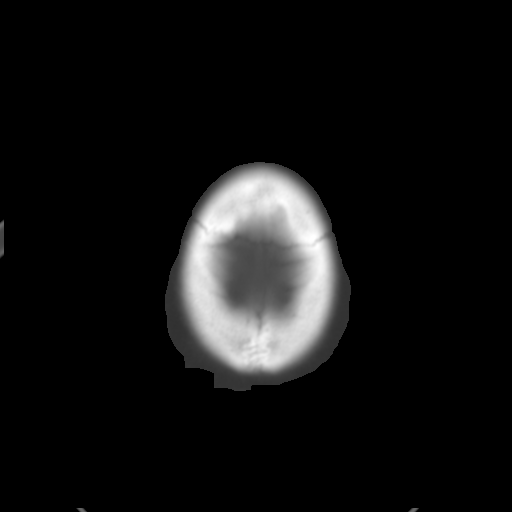

[Series 5: head 3.0 mpr cor · coronal · 0.33mm/px · 3 of 72 slices shown]
[im 24/72  brain]
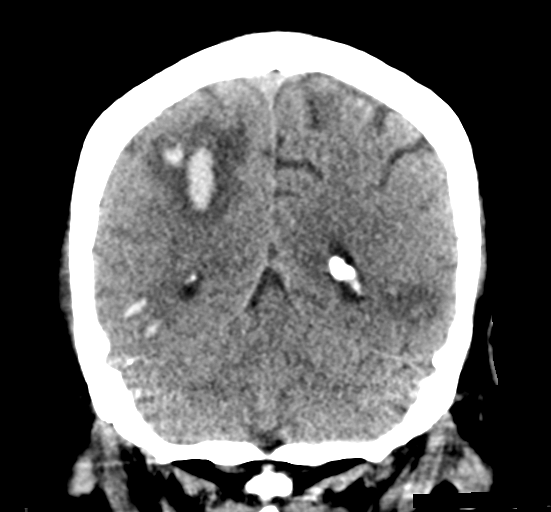
[im 32/72  brain]
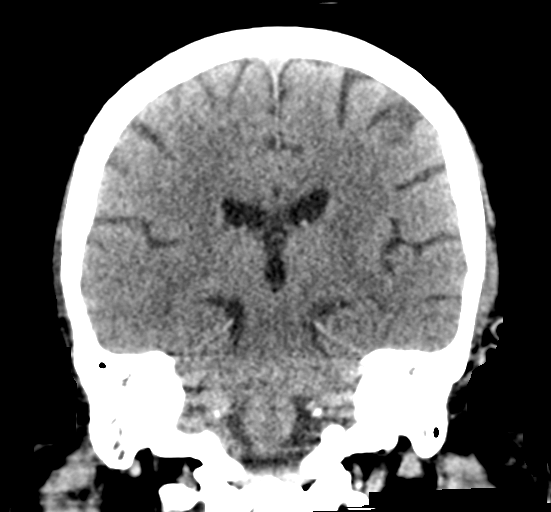
[im 40/72  brain]
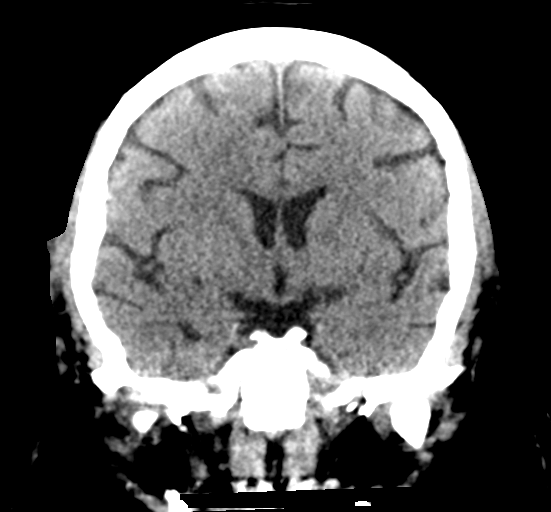

[Series 6: head 3.0 mpr sag · sagittal · 0.33mm/px · 3 of 65 slices shown]
[im 22/65  brain]
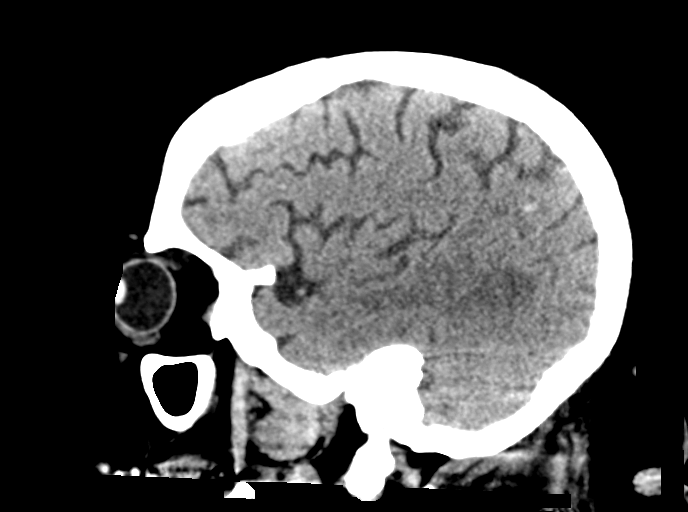
[im 33/65  brain]
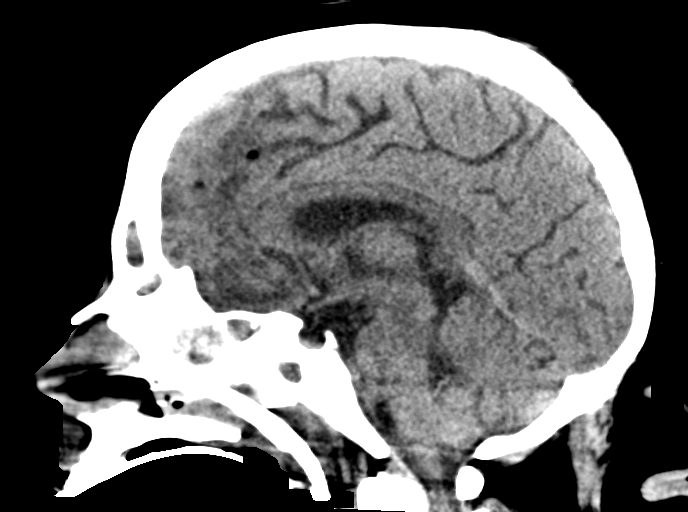
[im 43/65  brain]
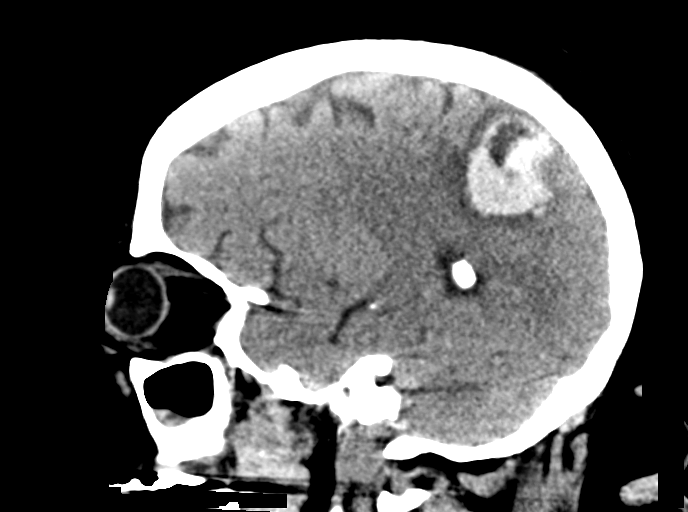

[15 of 47 positions shown; findings below may reference images not displayed]

FINDINGS: Brain: Multifocal areas of parenchymal hemorrhage identified
bilaterally with greatest involvement of the parietal lobes. The
largest area the right parietal lobe measures approximately 3.8 x
2.3 cm. There is edema associated with these hemorrhages causing
mild regional mass effect. No intraventricular extension. No
hydrocephalus. Gray-white differentiation is preserved.

Vascular: No hyperdense vessel or unexpected calcification.

Skull: Calvarium is unremarkable.

Sinuses/Orbits: Nonspecific extensive paranasal sinus opacification.

Other: Nonspecific mastoid and middle ear opacification. Partially
imaged endotracheal and enteric tubes.
IMPRESSION: Multifocal parenchymal hemorrhages, with the largest within the
right parietal lobe. Together with associated edema, there is mild
regional mass effect but no herniation. Considerations include
sequelae of ECMO anticoagulation, hemorrhagic conversion of embolic
infarcts, and vasculitis/vasculopathy.

These results were called by telephone at the time of interpretation
on 01/19/2020 at [DATE] to provider DONIELLE CARPIO , who verbally
acknowledged these results.

## 2021-09-11 IMAGING — CT CT HEAD W/O CM
3 series · 14 of 47 positions shown, 16 images · non-contrast
Comparison: 01/19/2020

CLINICAL DATA: Intracranial hemorrhage, follow-up

EXAM:
CT HEAD WITHOUT CONTRAST
TECHNIQUE: Contiguous axial images were obtained from the base of the skull
through the vertex without intravenous contrast.

[Series 3: head 5.0 h30s · axial · 0.44mm/px · z∈[-154,-24]mm · 8 of 32 slices shown, 10 images]
[im 3/32  brain]
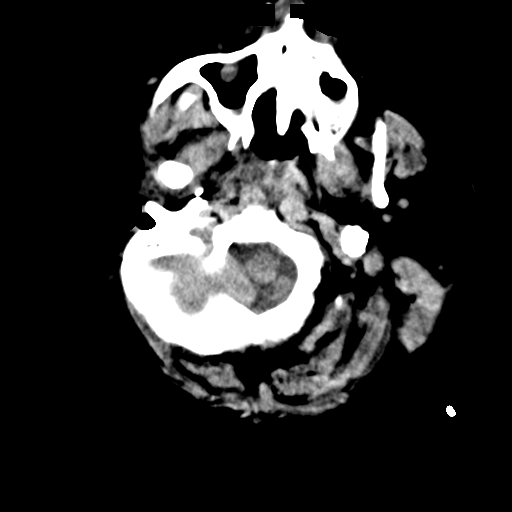
[im 3/32  bone]
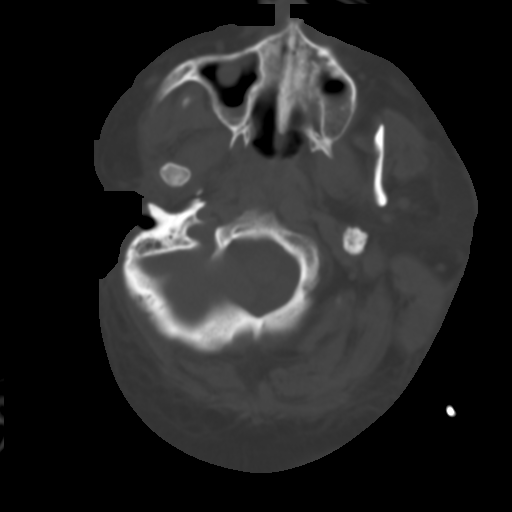
[im 7/32  brain]
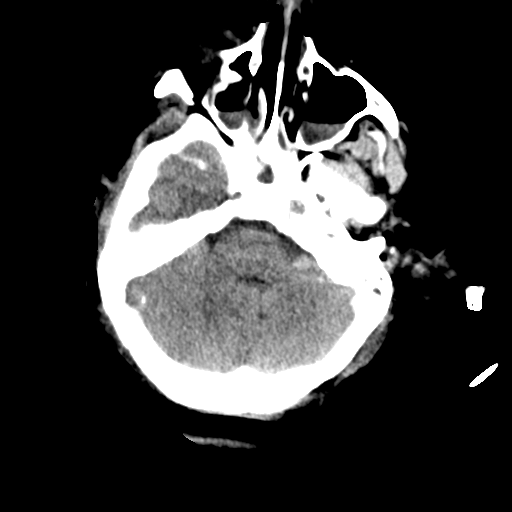
[im 10/32  brain]
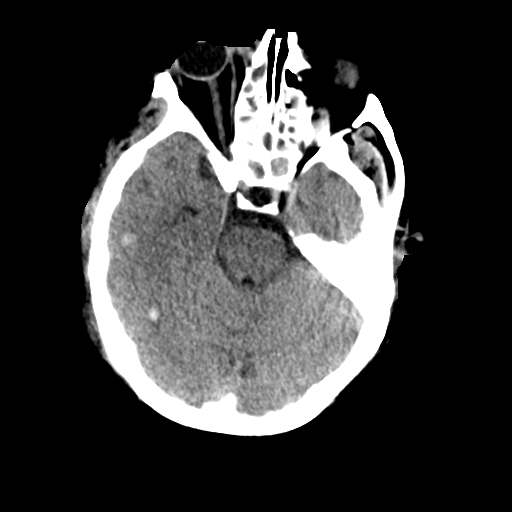
[im 14/32  brain]
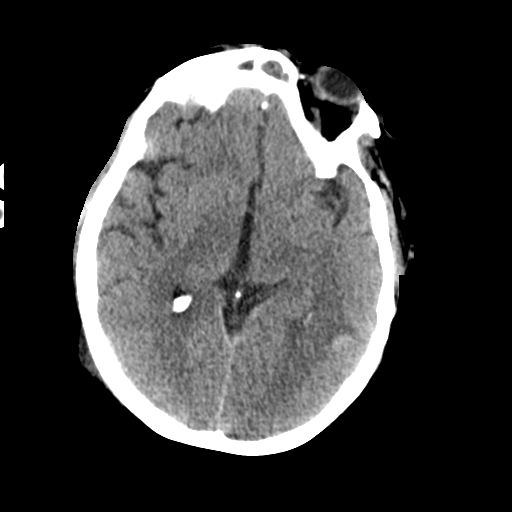
[im 18/32  brain]
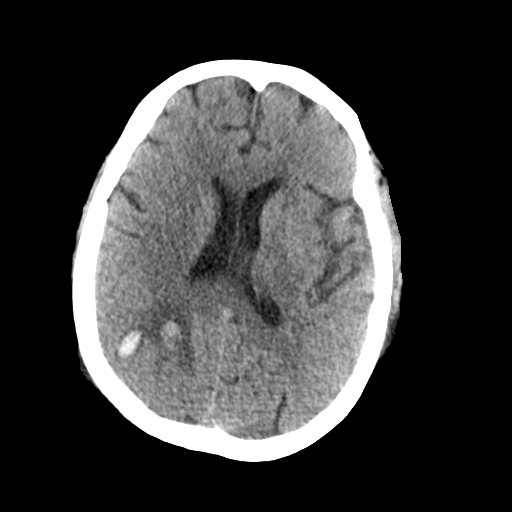
[im 18/32  bone]
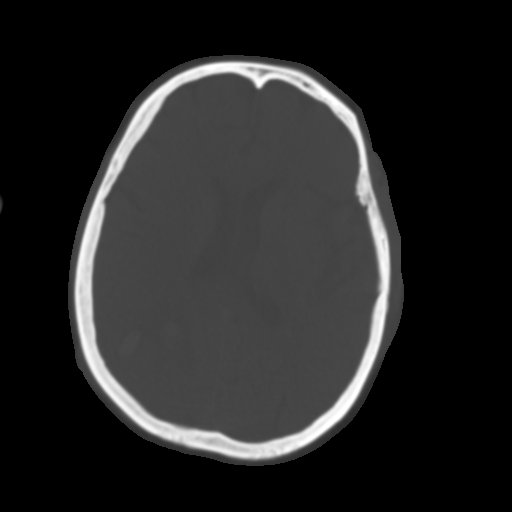
[im 22/32  brain]
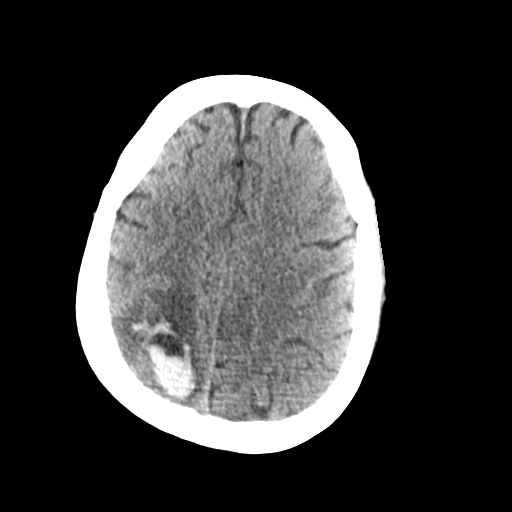
[im 25/32  brain]
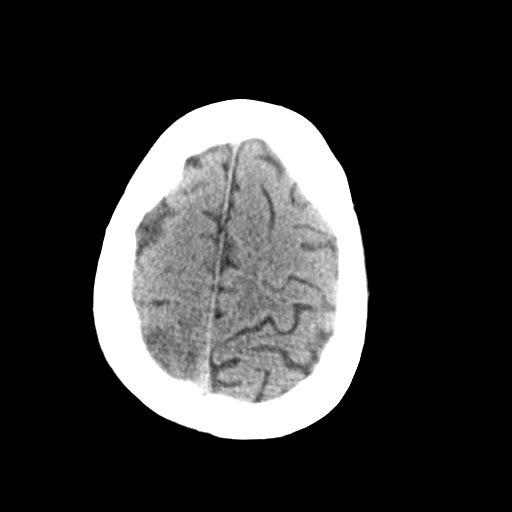
[im 29/32  brain]
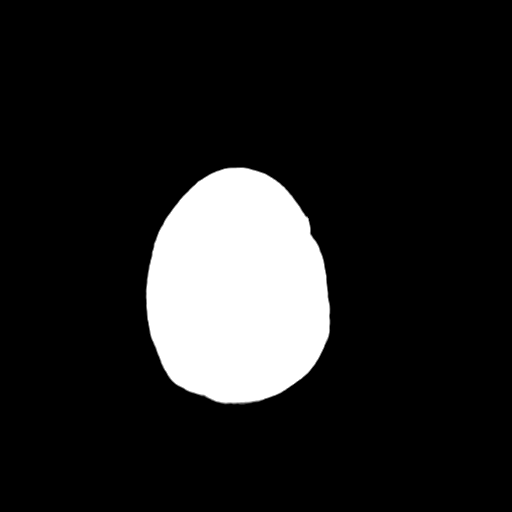

[Series 5: head 3.0 mpr cor · coronal · 0.30mm/px · 3 of 67 slices shown]
[im 23/67  brain]
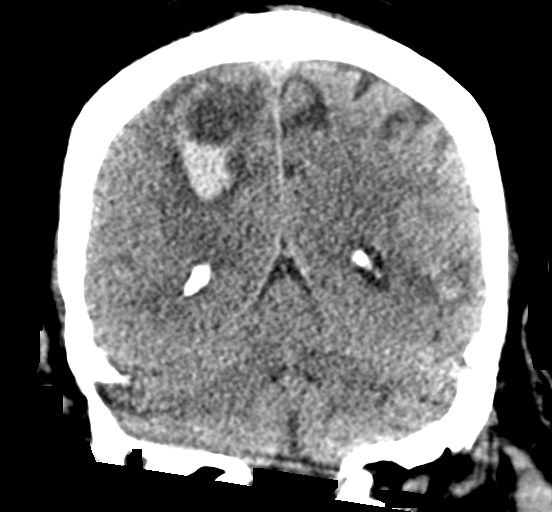
[im 30/67  brain]
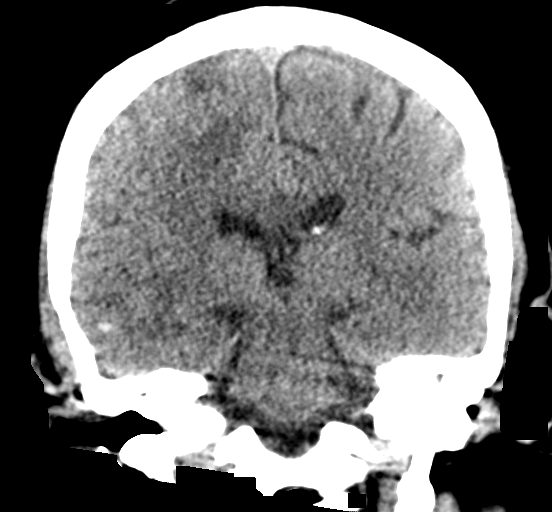
[im 37/67  brain]
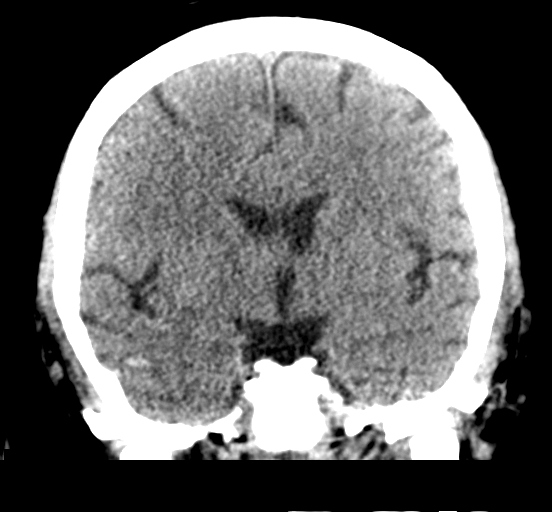

[Series 6: head 3.0 mpr sag · sagittal · 0.30mm/px · 3 of 67 slices shown]
[im 23/67  brain]
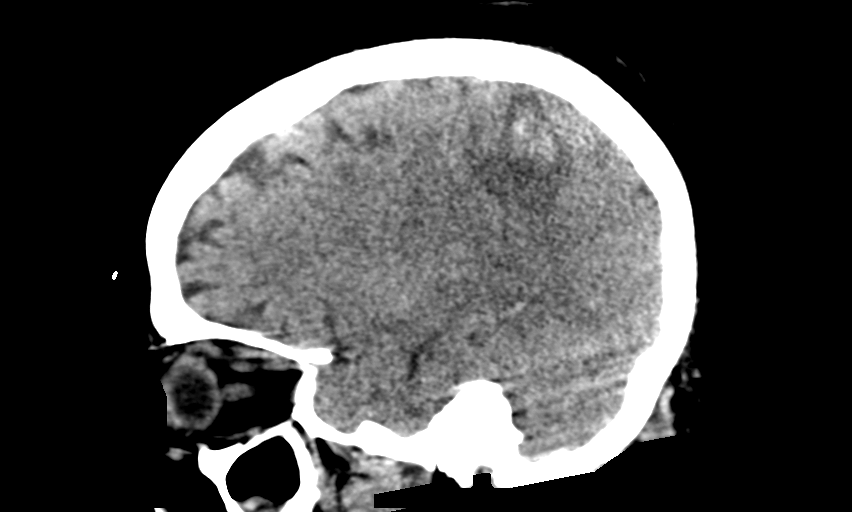
[im 34/67  brain]
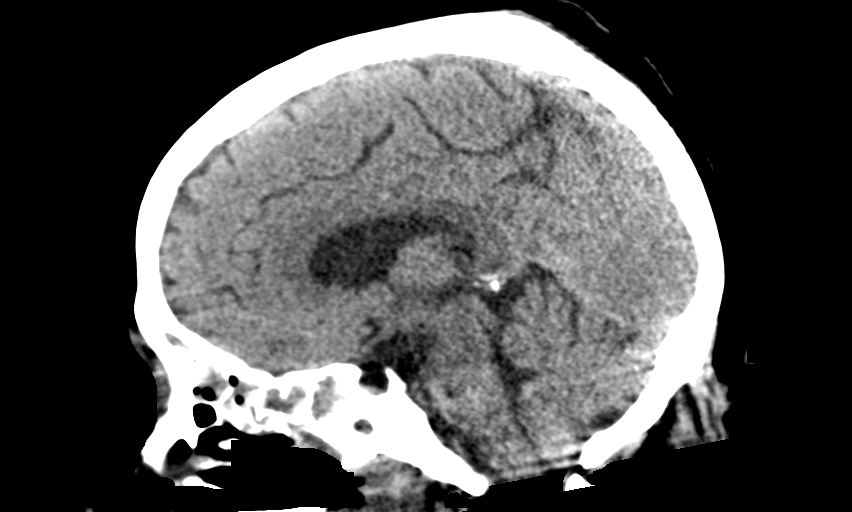
[im 45/67  brain]
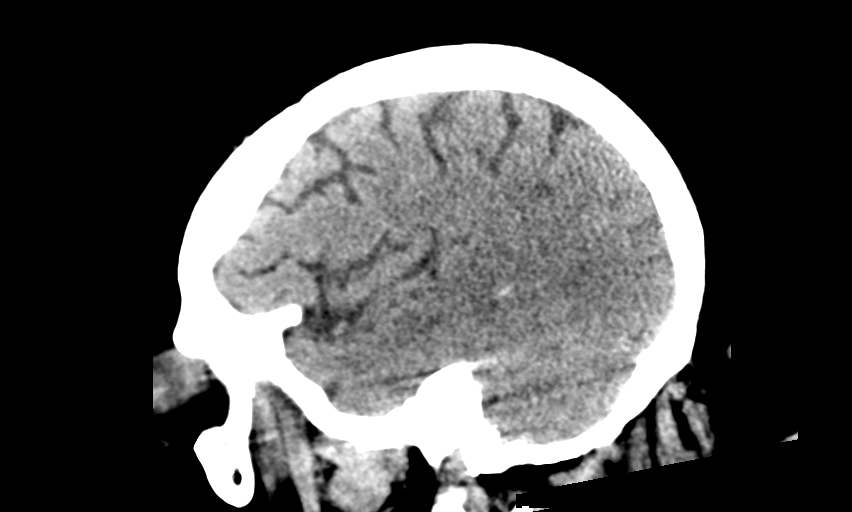

[14 of 47 positions shown; findings below may reference images not displayed]

FINDINGS: Brain: Multifocal parenchymal hemorrhage is again identified. As
noted previously, greatest involvement in the biparietal region with
the largest area of hemorrhage on the right measuring up to 3.7 cm
as before. There is mild edema associated with the areas of
hemorrhage without substantial mass effect. No new hemorrhage is
identified.

Gray-white differentiation remains preserved. Ventricles stable in
size.

Vascular: No new finding.

Skull: Calvarium is unremarkable.

Sinuses/Orbits: No acute finding.

Other: Partially imaged facial subcutaneous edema.
IMPRESSION: No substantial change in multifocal parenchymal hemorrhages.
Regional mass effect remains mild. No new hemorrhage.

## 2021-09-12 IMAGING — DX DG CHEST 1V PORT
1 series · 1 of 1 positions shown · non-contrast
Comparison: 01/20/2020

CLINICAL DATA: [id number] positivity with ECMO

EXAM:
PORTABLE CHEST 1 VIEW

[chest ap]
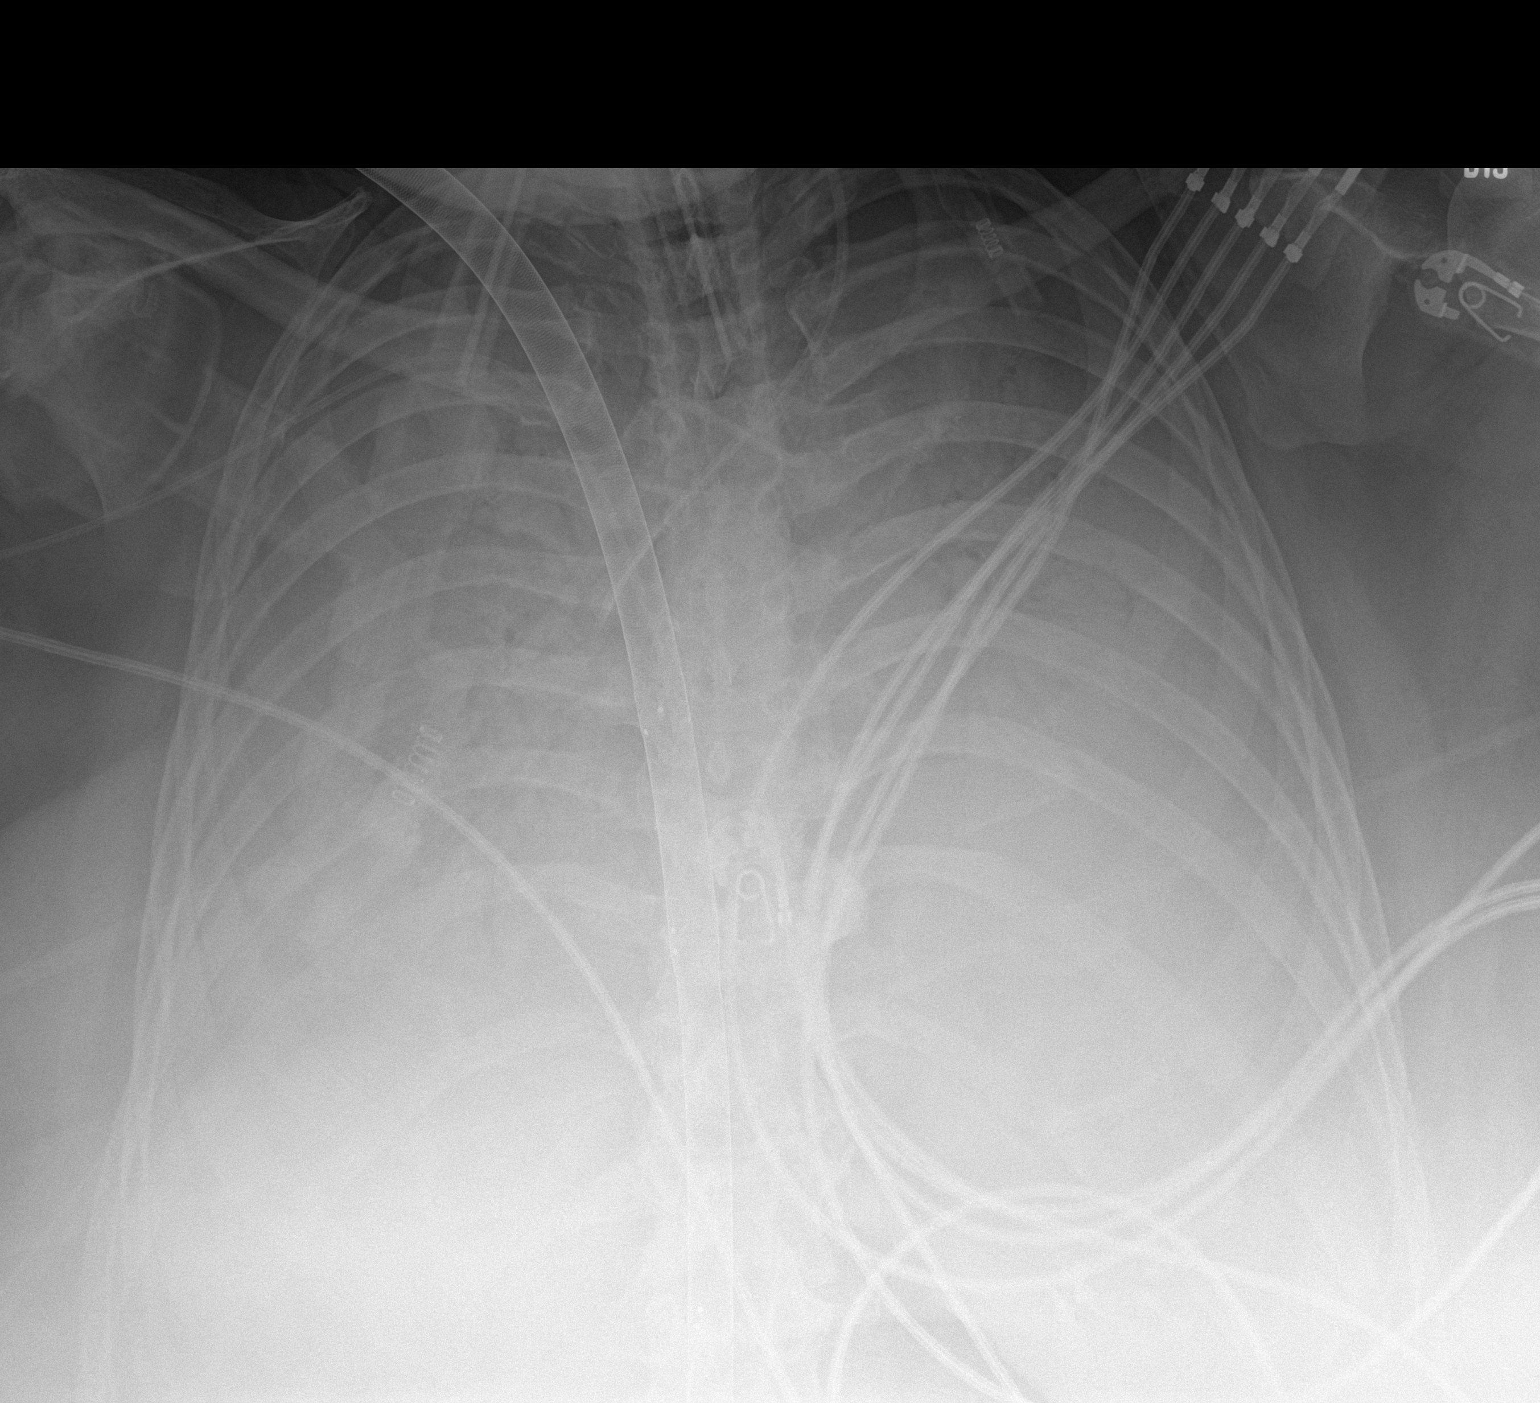

[1 of 1 positions shown; findings below may reference images not displayed]

FINDINGS: Endotracheal tube and left jugular central line is well as ECMO
cannula are again identified. Right-sided PICC line is seen and
stable. Feeding catheter has been removed in the interval.
Increasing opacity is noted throughout both lungs consistent with
progression of consolidation.
IMPRESSION: Increasing opacity in the lungs bilaterally.

Tubes and lines as described.

## 2021-09-12 IMAGING — DX DG CHEST 1V PORT
1 series · 2 of 2 positions shown · non-contrast
Comparison: 01/21/2020

CLINICAL DATA: NG tube placement.

EXAM:
PORTABLE CHEST 1 VIEW

[Series 1: chest · 0.14mm/px · 2 of 2 slices shown]
[im 1/2]
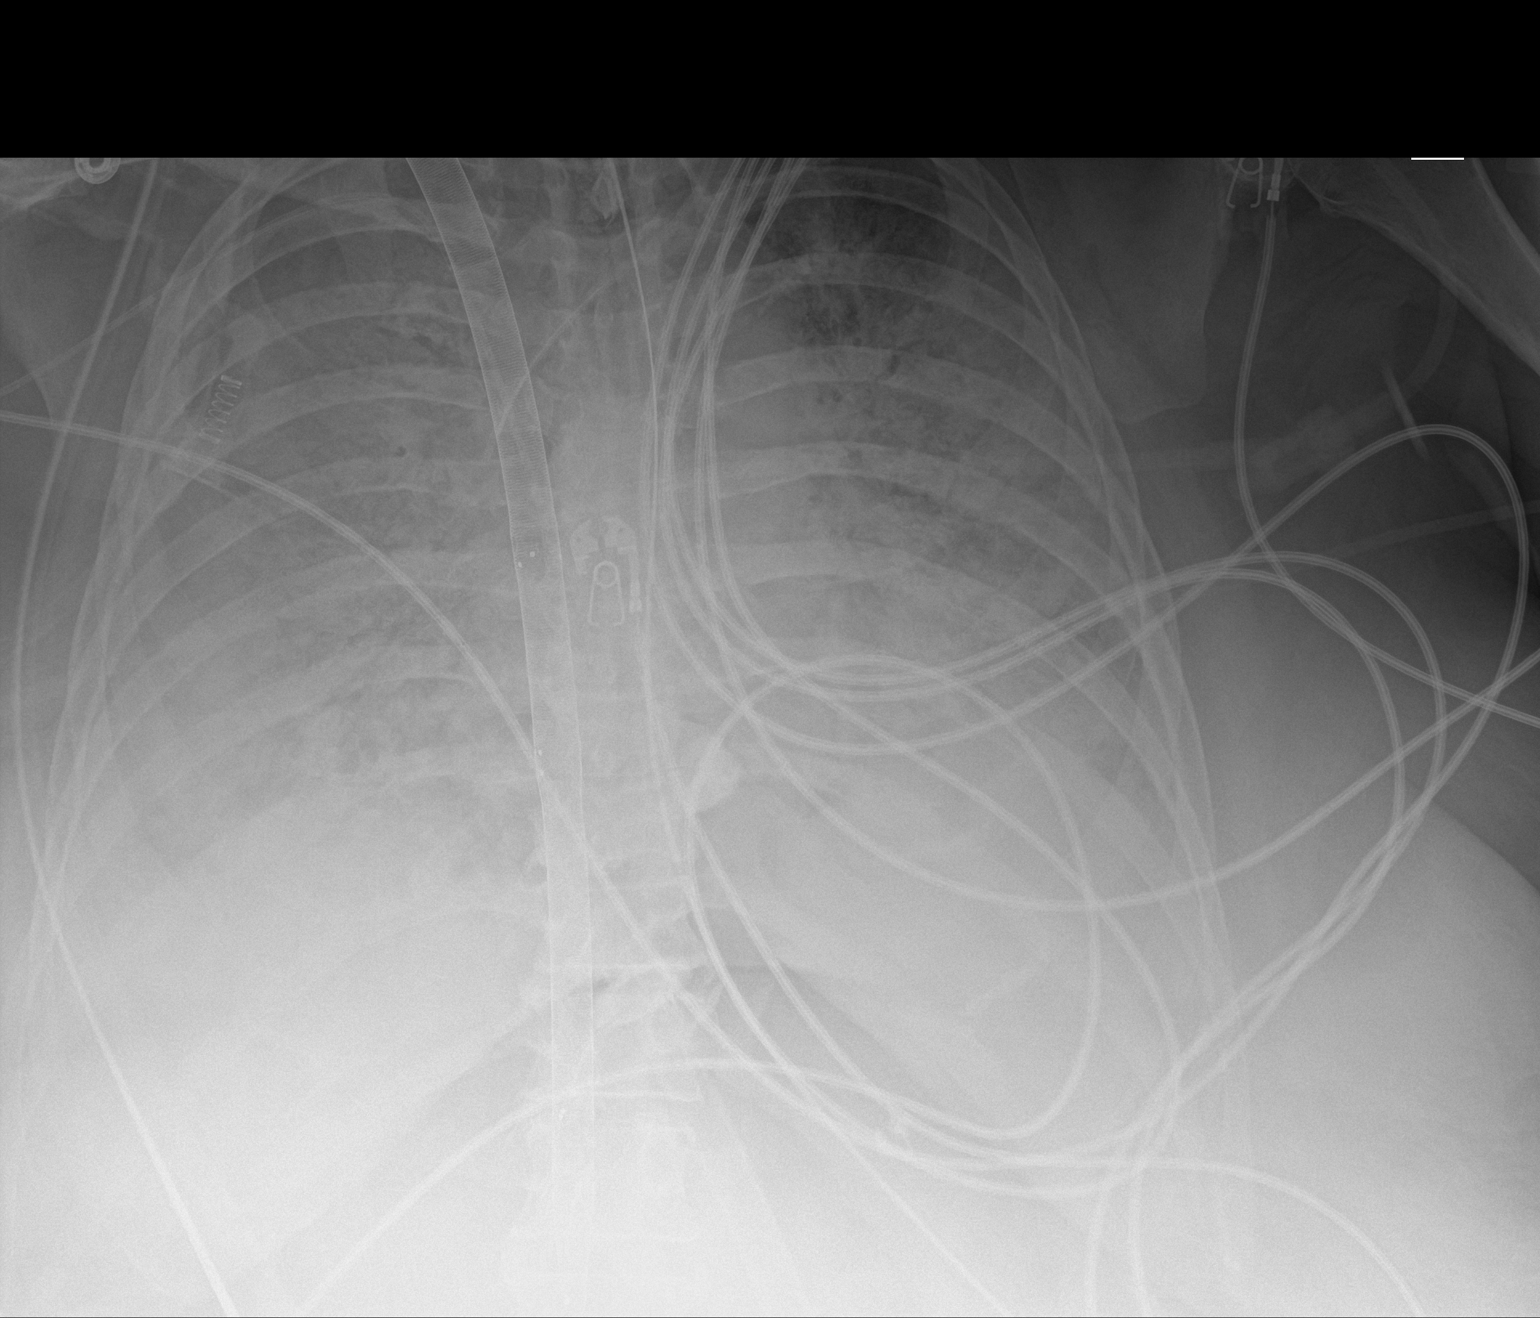
[im 2/2]
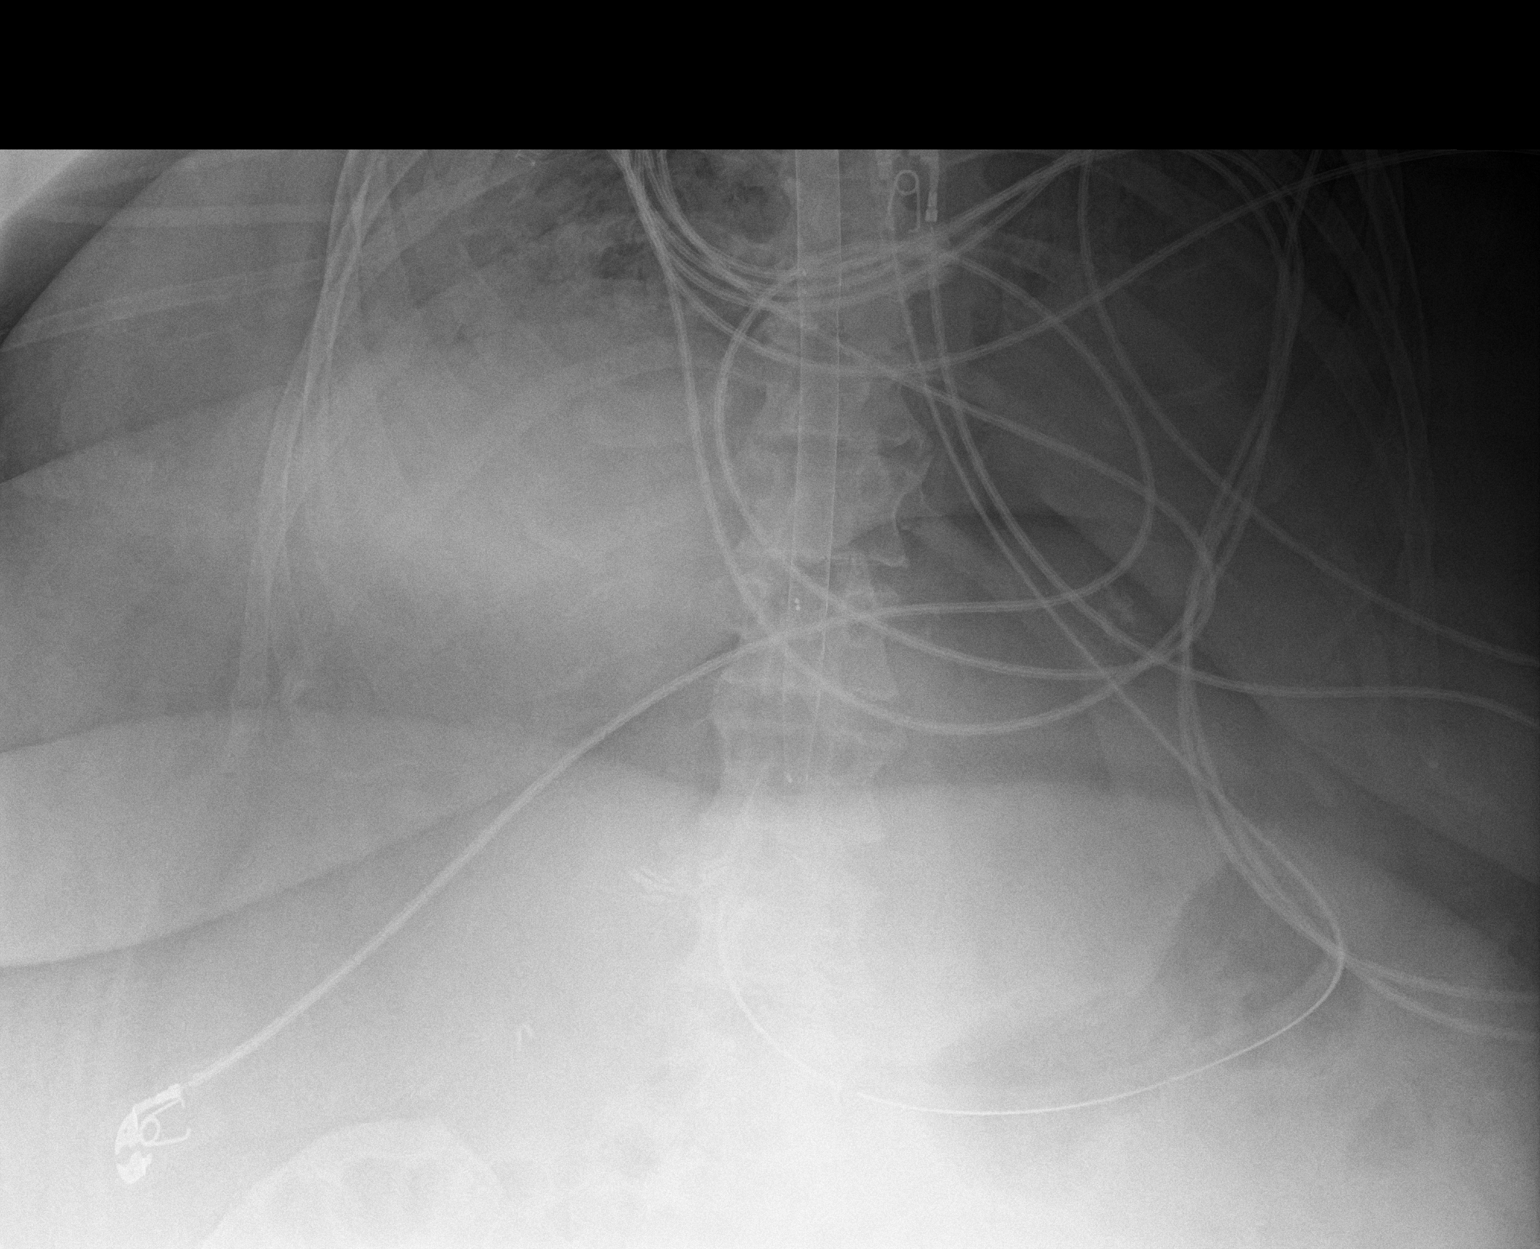

[2 of 2 positions shown; findings below may reference images not displayed]

FINDINGS: Endotracheal tube with tip 4.2 cm above the carina. Right-sided PICC
line has tip over the region of the SVC. Left IJ central venous
catheter has tip over the SVC. Interval placement of enteric tube
which courses into the region of the stomach and off the film as tip
is not visualized. ECMO apparatus unchanged.

Lungs are adequately inflated demonstrate persistent severe airspace
consolidation bilaterally with air bronchograms and slightly
improved aeration over the left apex. Findings likely due to
combination of infection and ARDS. Possible associated bilateral
effusions/bibasilar atelectasis. Remainder of the exam is unchanged.
IMPRESSION: 1. Persistent severe bilateral airspace process with air
bronchograms with slightly improved aeration over the left apex.
Findings likely due to combination of infection and ARDS. Possible
associated bilateral effusions/bibasilar atelectasis.
2. Tubes and lines as described.

## 2021-09-13 IMAGING — DX DG CHEST 1V PORT
1 series · 1 of 1 positions shown · non-contrast
Comparison: 1 day prior

CLINICAL DATA: On ECMO.  [id number] positive.

EXAM:
PORTABLE CHEST 1 VIEW

[chest]
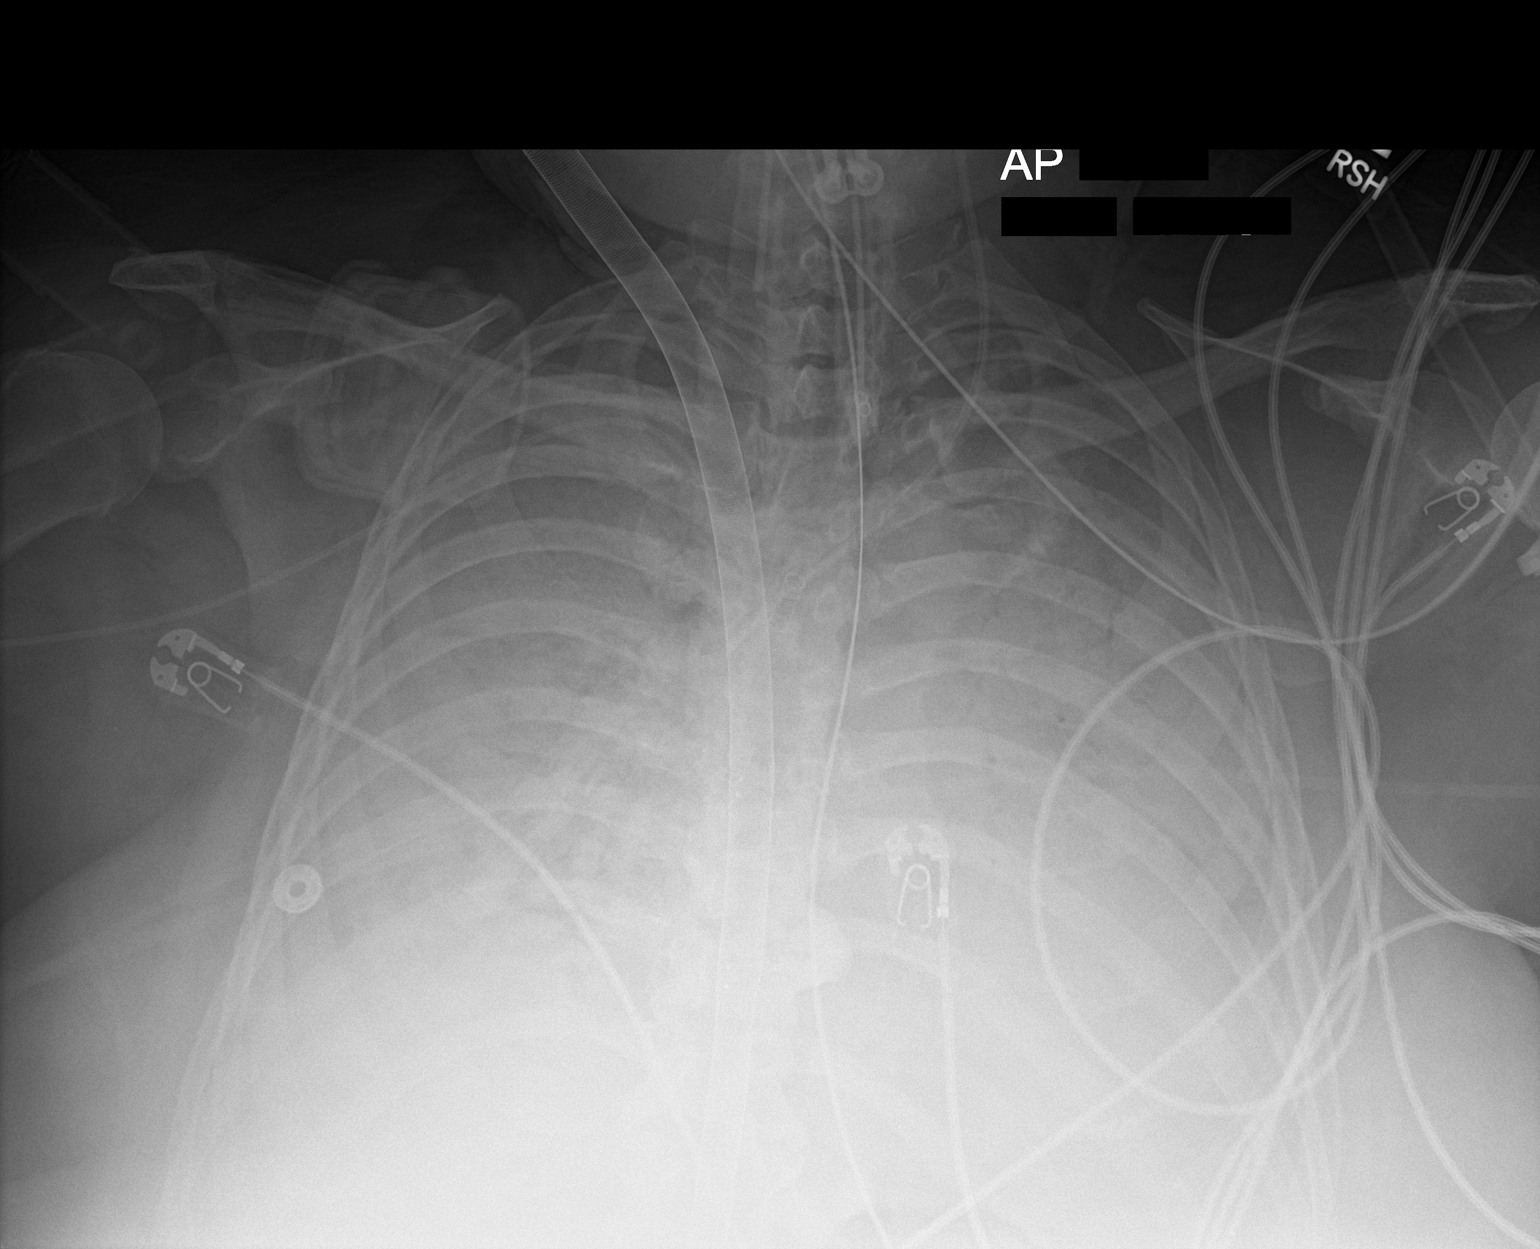

[1 of 1 positions shown; findings below may reference images not displayed]

FINDINGS: Endotracheal tube terminates 5.0 cm above carina. Nasogastric tube
extends beyond the inferior aspect of the film. ECMO catheter
unchanged in position.

Left internal jugular line tip at low SVC. Right-sided PICC line is
difficult to follow centrally.

Diffuse white out of the chest, relatively similar with mild
progression in the left upper lung. No pneumothorax.
IMPRESSION: Minimally worsened left-sided aeration with complete whiteout of the
chest. No pneumothorax or other acute complication.

## 2021-09-13 IMAGING — CT CT HEAD W/O CM
3 series · 15 of 47 positions shown, 18 images · non-contrast
Comparison: 01/20/2020

CLINICAL DATA: Intracranial hemorrhage, follow-up

EXAM:
CT HEAD WITHOUT CONTRAST
TECHNIQUE: Contiguous axial images were obtained from the base of the skull
through the vertex without intravenous contrast.

[Series 3: head 5.0 h30s · axial · 0.47mm/px · z∈[-159,-19]mm · 9 of 34 slices shown, 12 images]
[im 3/34  brain]
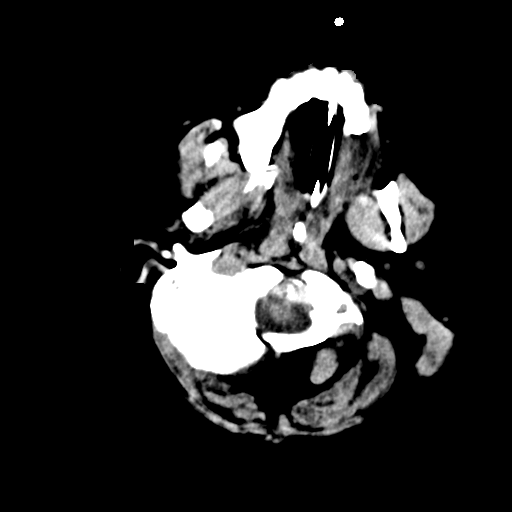
[im 3/34  bone]
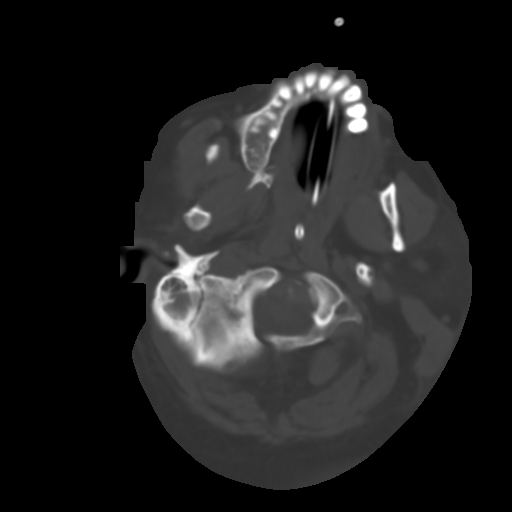
[im 6/34  brain]
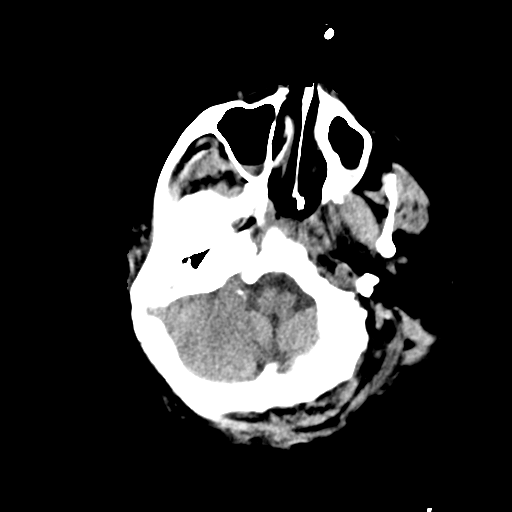
[im 10/34  brain]
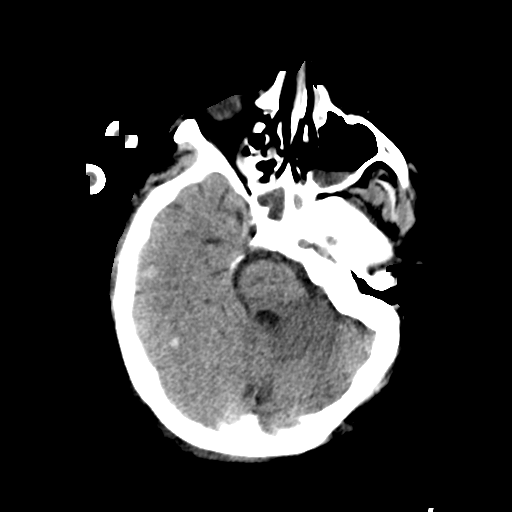
[im 13/34  brain]
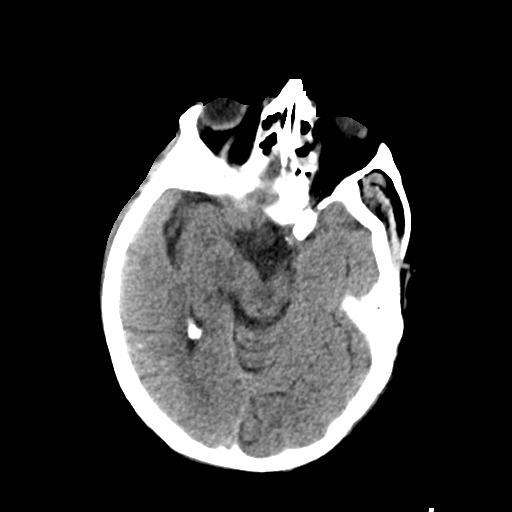
[im 18/34  brain]
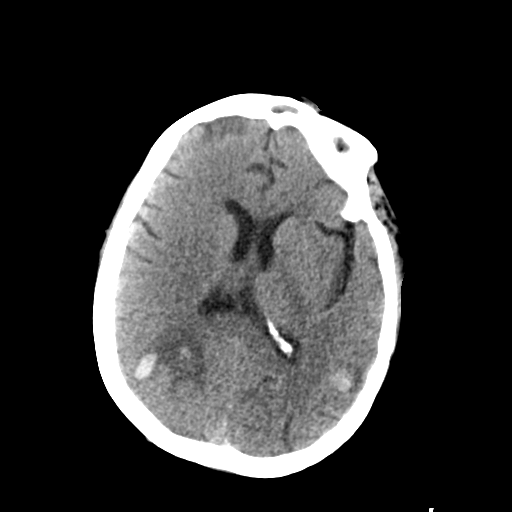
[im 18/34  bone]
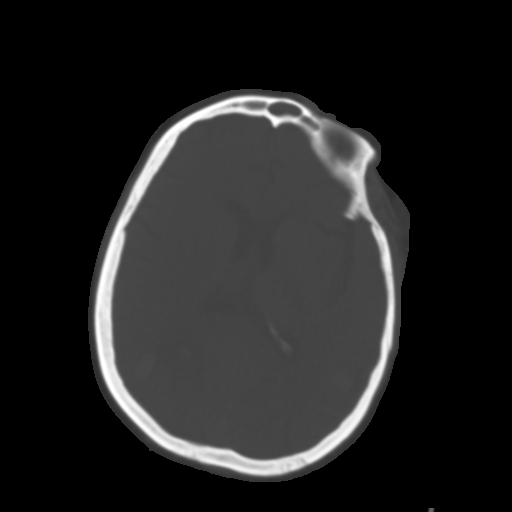
[im 21/34  brain]
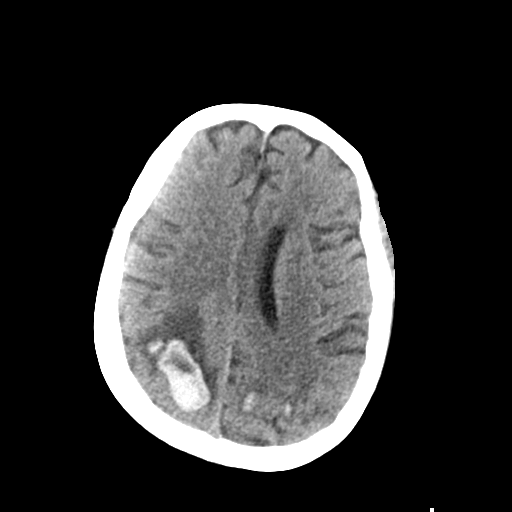
[im 24/34  brain]
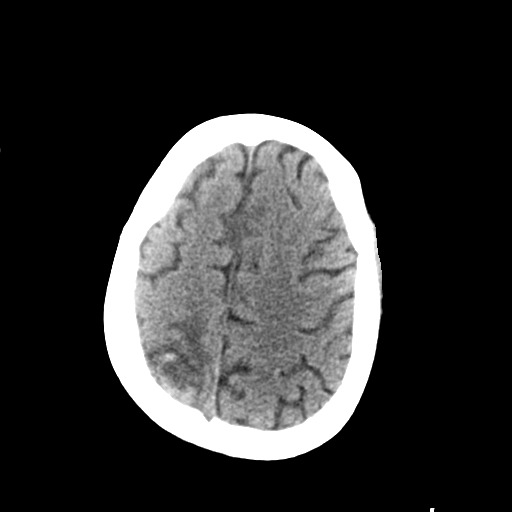
[im 28/34  brain]
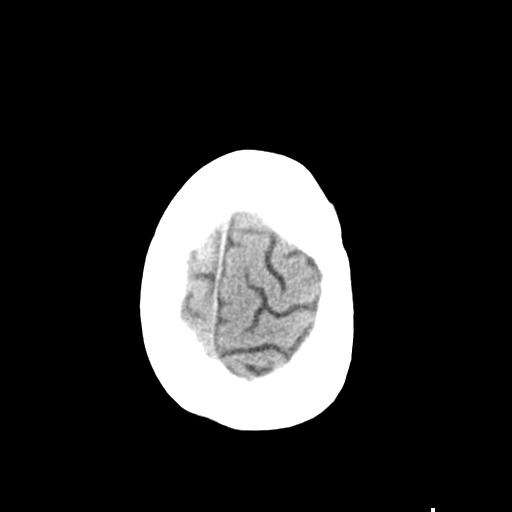
[im 31/34  brain]
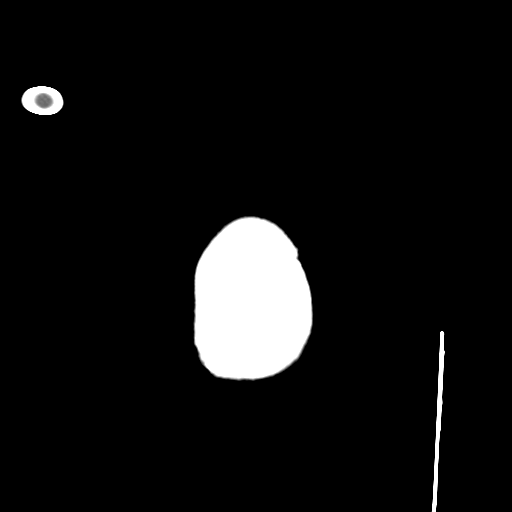
[im 31/34  bone]
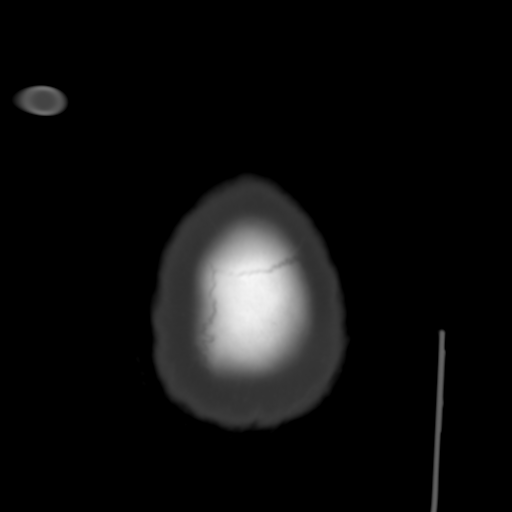

[Series 5: head 3.0 mpr cor · coronal · 0.34mm/px · 3 of 67 slices shown]
[im 23/67  brain]
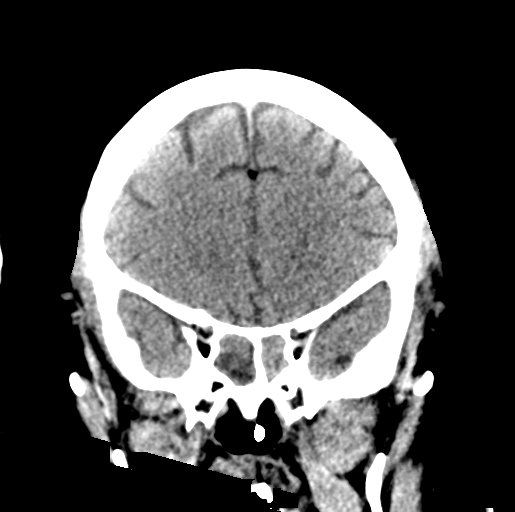
[im 30/67  brain]
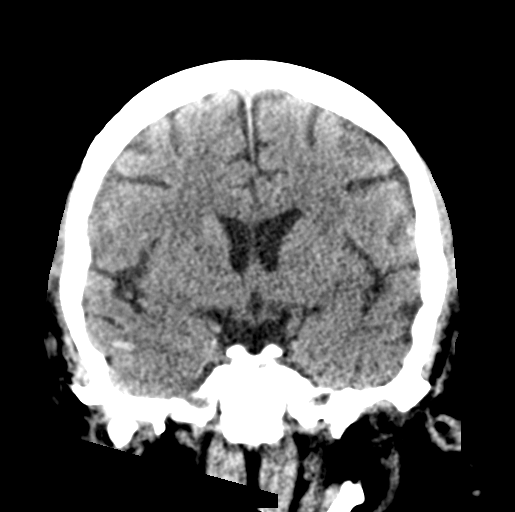
[im 37/67  brain]
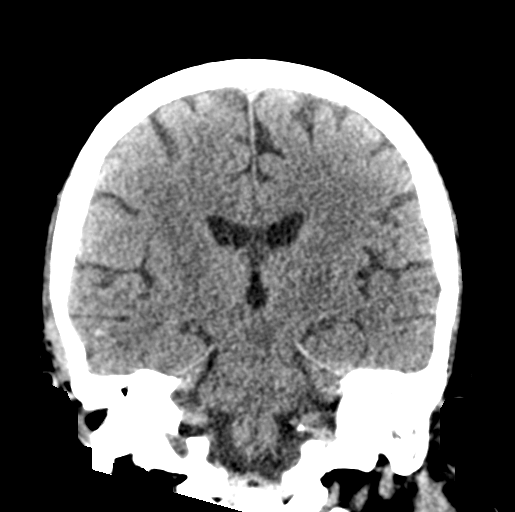

[Series 6: head 3.0 mpr sag · sagittal · 0.33mm/px · 3 of 67 slices shown]
[im 28/67  brain]
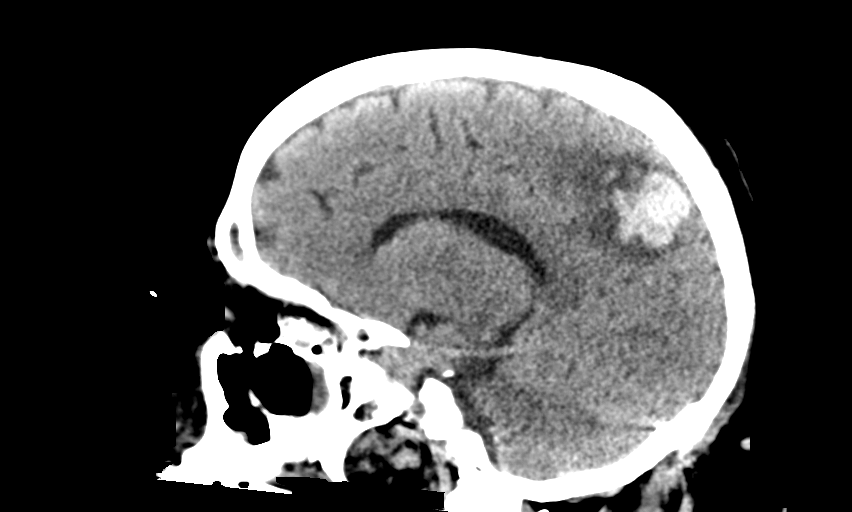
[im 34/67  brain]
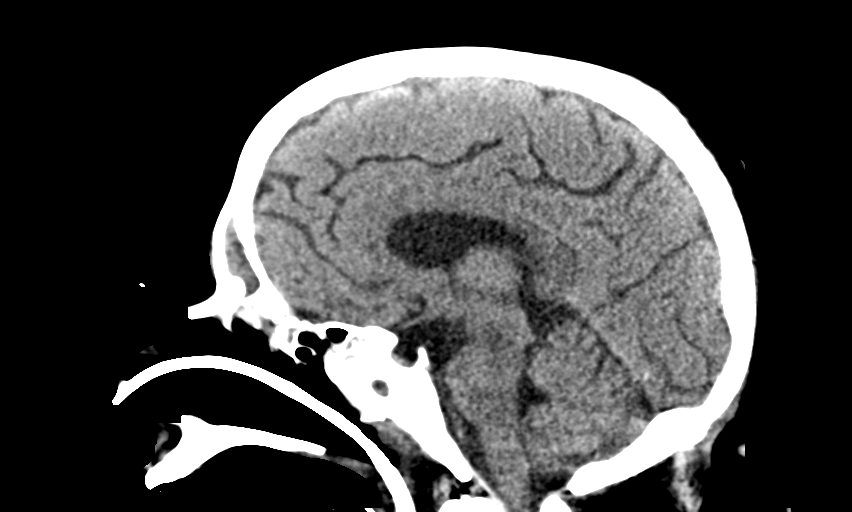
[im 39/67  brain]
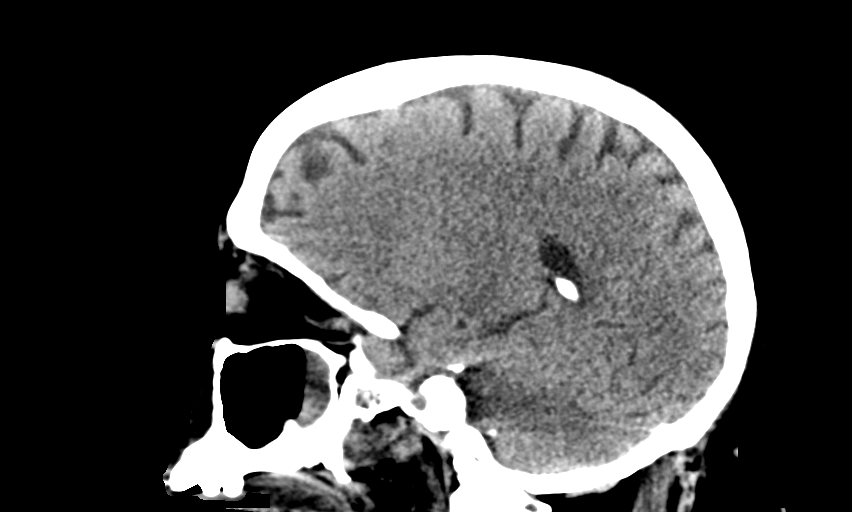

[15 of 47 positions shown; findings below may reference images not displayed]

FINDINGS: Brain: Multifocal parenchymal hemorrhage is not substantially
changed. The largest area hemorrhage in the right parietal region is
similar in size. Associated edema and regional mass effect are also
similar. There is no new loss of gray-white differentiation.
Ventricles are stable in size. No evidence of intraventricular
extension of hemorrhage.

Vascular: No new findings.

Skull: Calvarium is unremarkable.

Sinuses/Orbits: Nonspecific paranasal sinus mucosal thickening and
opacification. No new orbital finding.

Other: Nonspecific mastoid and middle ear effusions.
IMPRESSION: Continued stability with no substantial change in multifocal
parenchymal hemorrhages and associated mild regional mass effect. No
new hemorrhage.

## 2021-09-14 IMAGING — DX DG CHEST 1V PORT
1 series · 1 of 1 positions shown · non-contrast
Comparison: Radiograph yesterday.  CT 01/19/2020

CLINICAL DATA: Cardio respiratory failure

EXAM:
PORTABLE CHEST 1 VIEW

[chest]
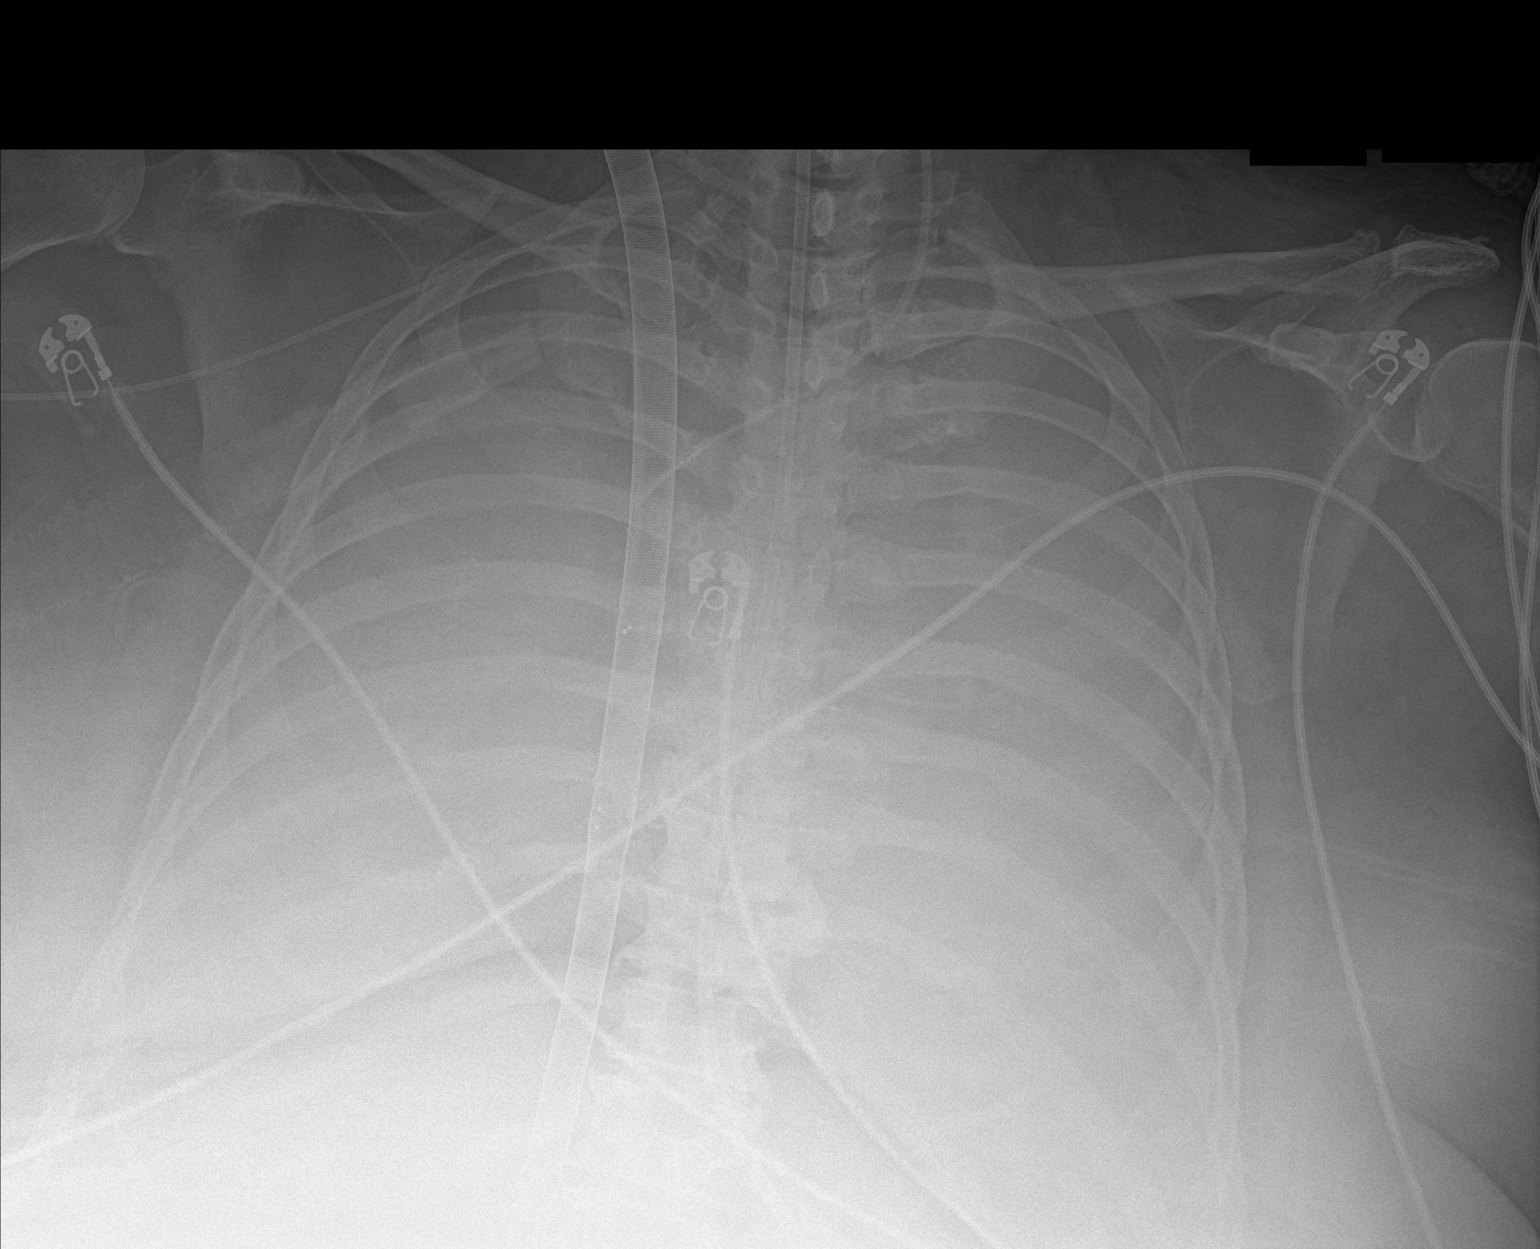

[1 of 1 positions shown; findings below may reference images not displayed]

FINDINGS: The endotracheal tube is not visualized on the current exam. There
is an enteric tube in place with tip below the diaphragm. Left
internal jugular central venous catheter tip projects in the region
of the upper SVC. ECMO catheter tip below the diaphragm not included
in the field of view. There is a right upper extremity PICC tip in
the SVC. Near complete opacification of both hemi thoraces with only
small portion of aerated lung in the upper lobes. Near complete
bilateral lung white out. Cardiomediastinal contours are obscured
and not well visualized.
IMPRESSION: 1. Progressive advanced bilateral airspace disease with diffuse
bilateral lung opacity.
2. Endotracheal tube is not visualized on the current exam.
Remaining support apparatus unchanged.

## 2021-09-14 IMAGING — DX DG CHEST 1V PORT
1 series · 1 of 1 positions shown · non-contrast
Comparison: 01/23/2020.

CLINICAL DATA: Respiratory failure.  COVID positive.  ECMO.

EXAM:
PORTABLE CHEST 1 VIEW

[chest ap]
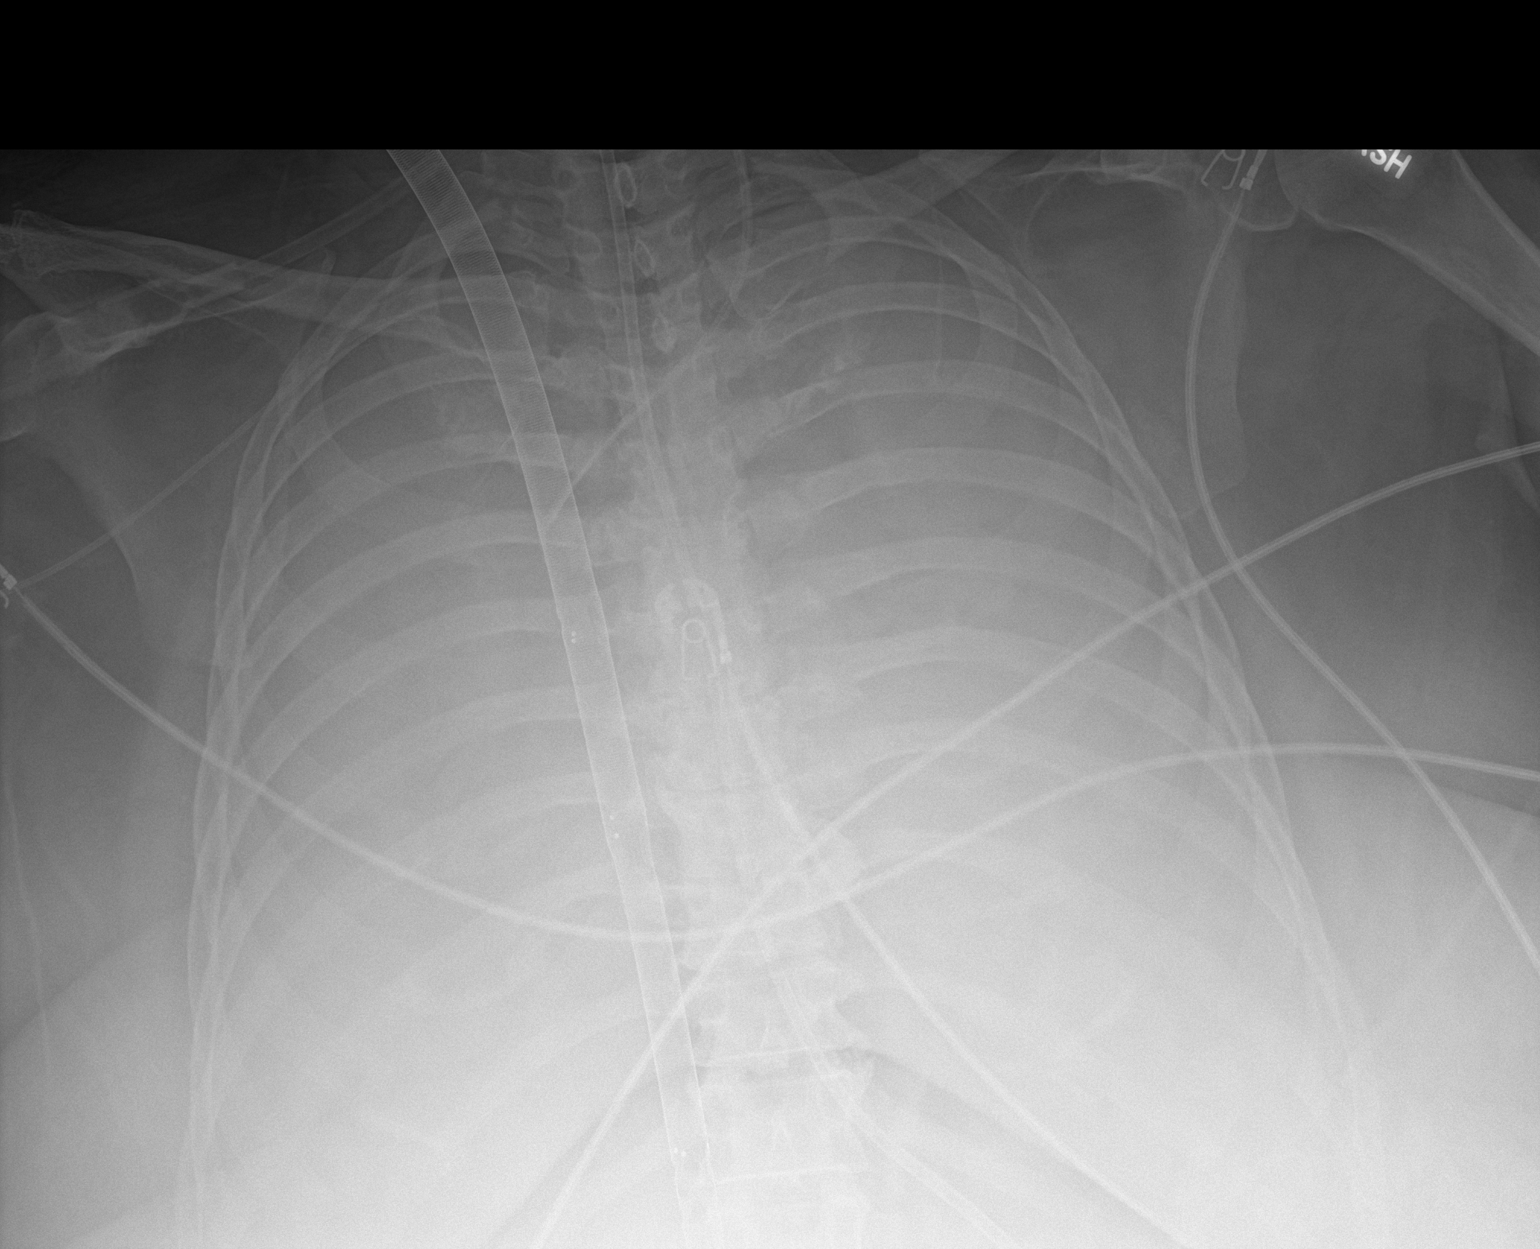

[1 of 1 positions shown; findings below may reference images not displayed]

FINDINGS: Feeding tube, left IJ line, ECMO device in stable position. Complete
opacification of both lungs again noted. Heart size cannot be
evaluated. No pneumothorax. Prior cervical fusion.
IMPRESSION: 1.  Lines and tubes including ECMO catheter stable position.

2. Complete opacification of both lungs again noted without interim
change.

## 2021-09-16 IMAGING — DX DG CHEST 1V PORT
1 series · 1 of 1 positions shown · non-contrast
Comparison: 01/24/2020.

CLINICAL DATA: Respiratory failure.  ECMO.  COVID positive.

EXAM:
PORTABLE CHEST 1 VIEW

[chest]
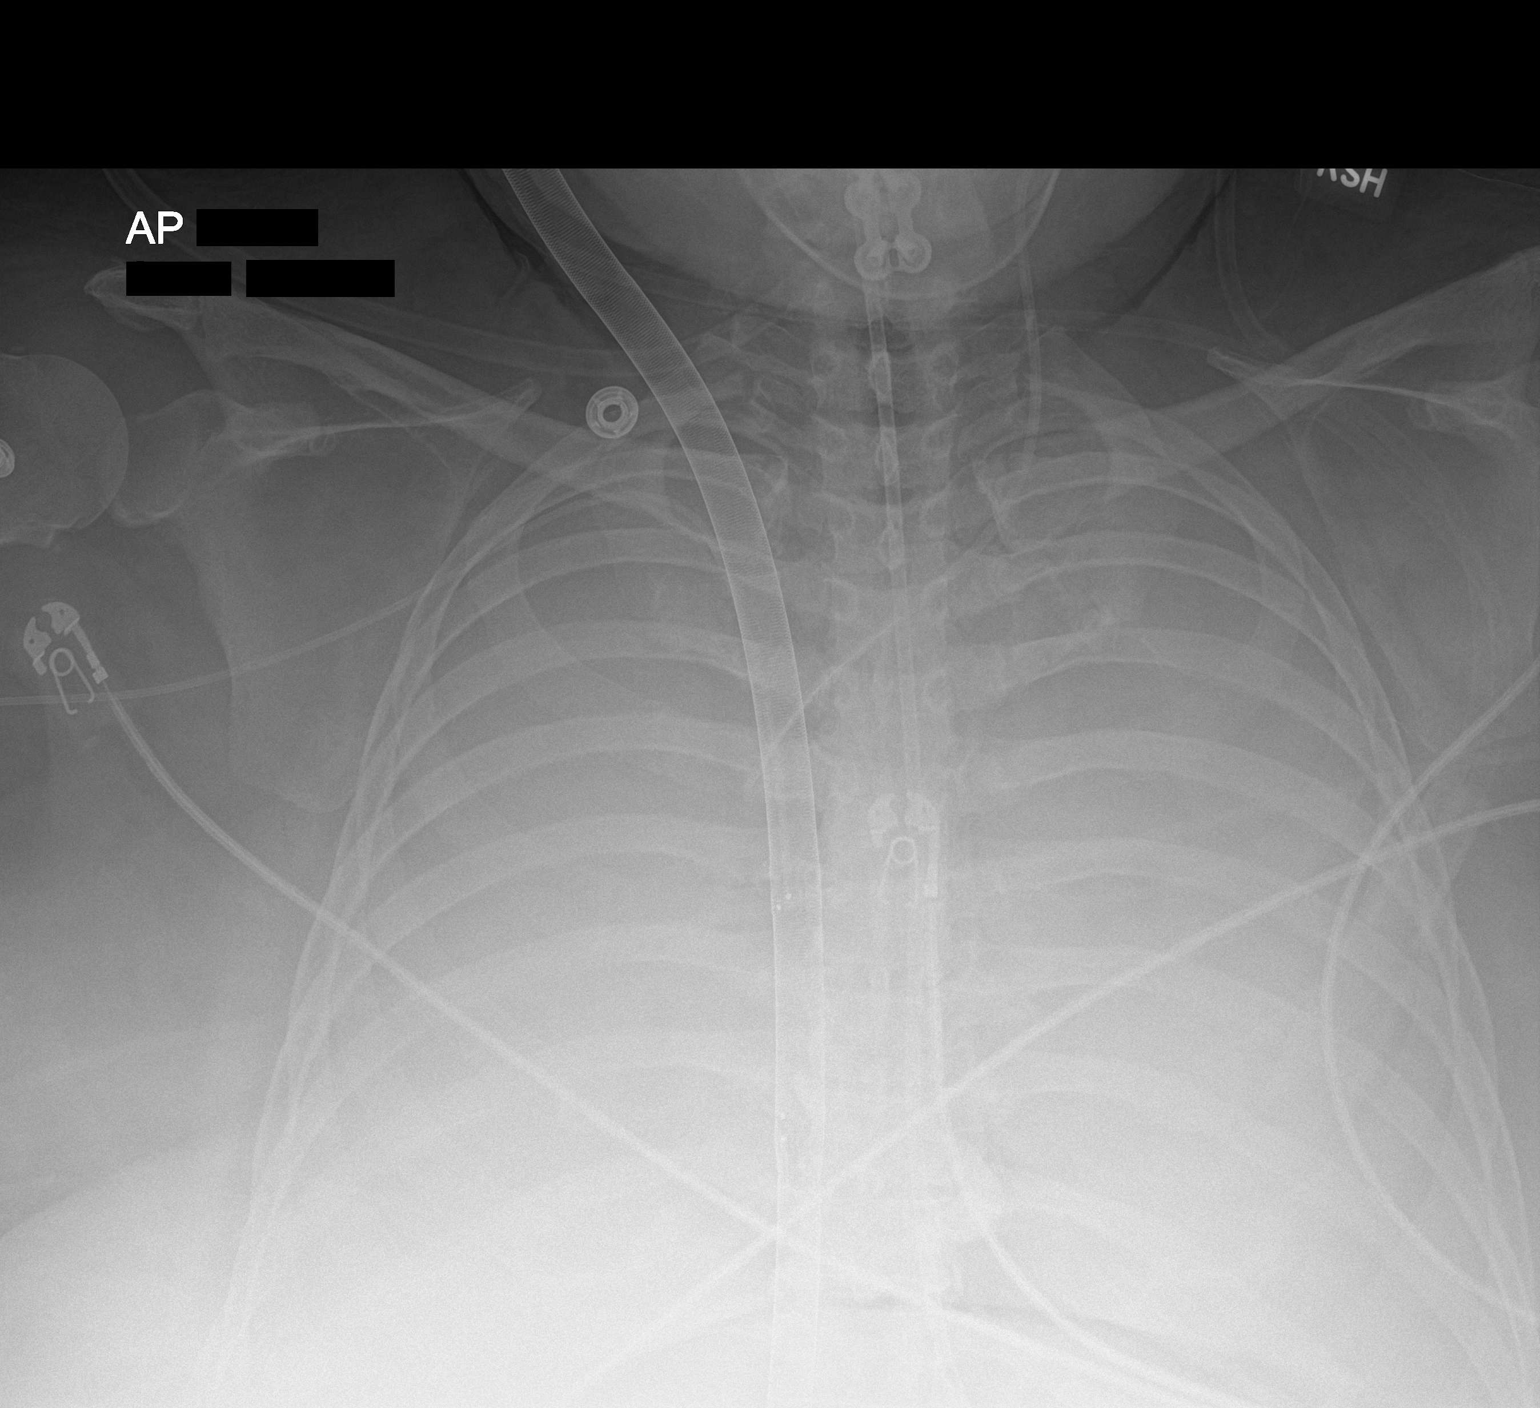

[1 of 1 positions shown; findings below may reference images not displayed]

FINDINGS: Feeding tube, left IJ line, right PICC line, ECMO device in stable
position. Complete opacification of both lungs again noted. No
interim change. Heart size cannot be accessed due to the complete of
opacification of both hemi thoraces.

No pneumothorax.  Prior cervical spine fusion.
IMPRESSION: 1. Feeding tube, left IJ line, right PICC line, ECMO device in
stable position.

2.  Complete opacification of both lungs again noted.

## 2021-09-17 IMAGING — DX DG CHEST 1V PORT
1 series · 1 of 1 positions shown · non-contrast
Comparison: January 25, 2020

CLINICAL DATA: Hypoxia

EXAM:
PORTABLE CHEST 1 VIEW

[chest ap]
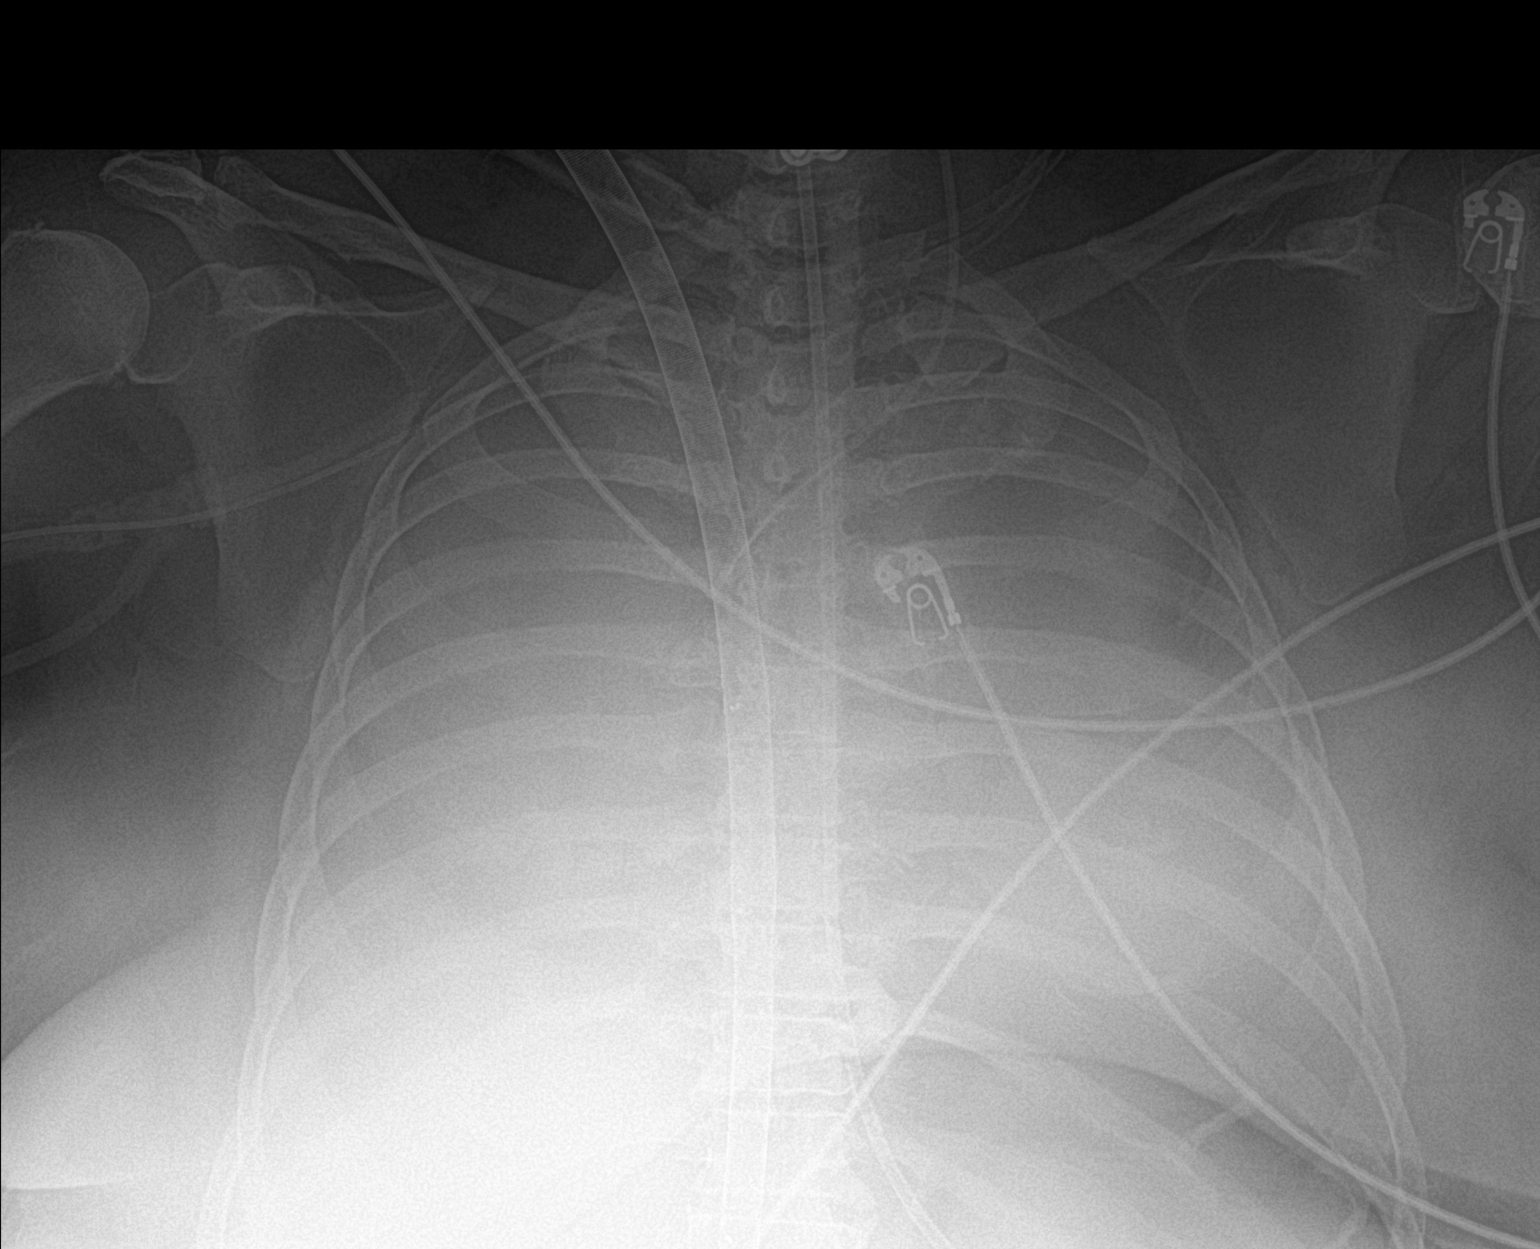

[1 of 1 positions shown; findings below may reference images not displayed]

FINDINGS: ECMO catheter tip is below the diaphragm. Feeding tube tip is below
the diaphragm. Left jugular catheter tip is in the superior vena
cava. No pneumothorax. Diffuse opacification of the lungs
bilaterally with likely superimposed pleural effusions noted. Heart
is obscured by diffuse airspace opacity. Grossly stable cardiac
silhouette. Postoperative change noted in the lower cervical spine.
IMPRESSION: Tube and catheter positions as described without appreciable
pneumothorax. Diffuse airspace opacity throughout the lungs with
likely superimposed pleural effusions noted. Grossly stable cardiac
silhouette.

## 2021-09-18 IMAGING — DX DG CHEST 1V PORT
1 series · 1 of 1 positions shown · non-contrast
Comparison: Chest x-ray 01/26/2020.

CLINICAL DATA: 54-year-old female with history of cardiorespiratory
failure.

EXAM:
PORTABLE CHEST 1 VIEW

[chest ap]
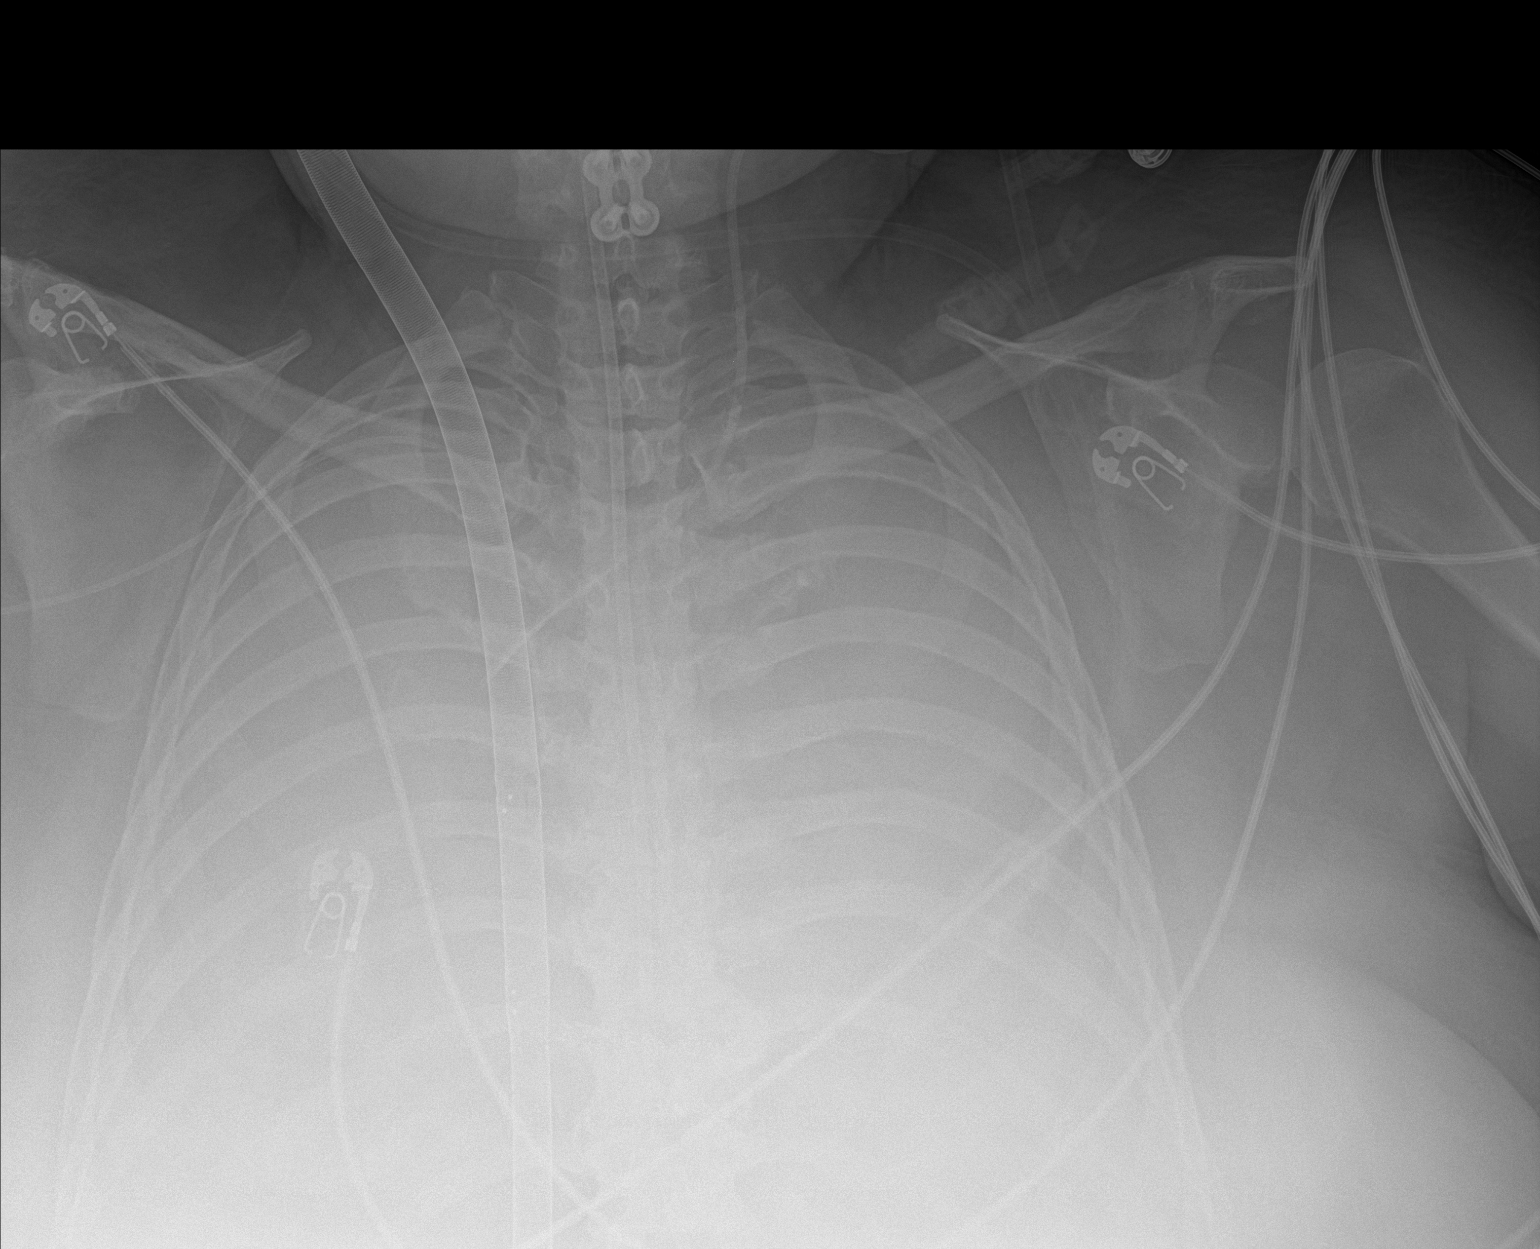

[1 of 1 positions shown; findings below may reference images not displayed]

FINDINGS: ECMO cannula noted. Left internal jugular central venous catheter
with tip terminating in the proximal superior vena cava. A feeding
tube is seen extending into the abdomen, however, the tip of the
feeding tube extends below the lower margin of the image. Complete
opacification of the lungs bilaterally obscuring the heart and
mediastinal contours. No pneumothorax. Orthopedic fixation hardware
throughout the lower cervical spine incidentally noted.
IMPRESSION: 1. Support apparatus, as above.
2. Complete opacification of the lungs bilaterally, similar to the
prior study.

## 2021-09-19 IMAGING — DX DG CHEST 1V
1 series · 1 of 1 positions shown · non-contrast
Comparison: Single-view of the chest 01/28/2020.

CLINICAL DATA: Status post line placement today. Shortness of
breath. [id number] pneumonia.

EXAM:
CHEST  1 VIEW

[chest ap]
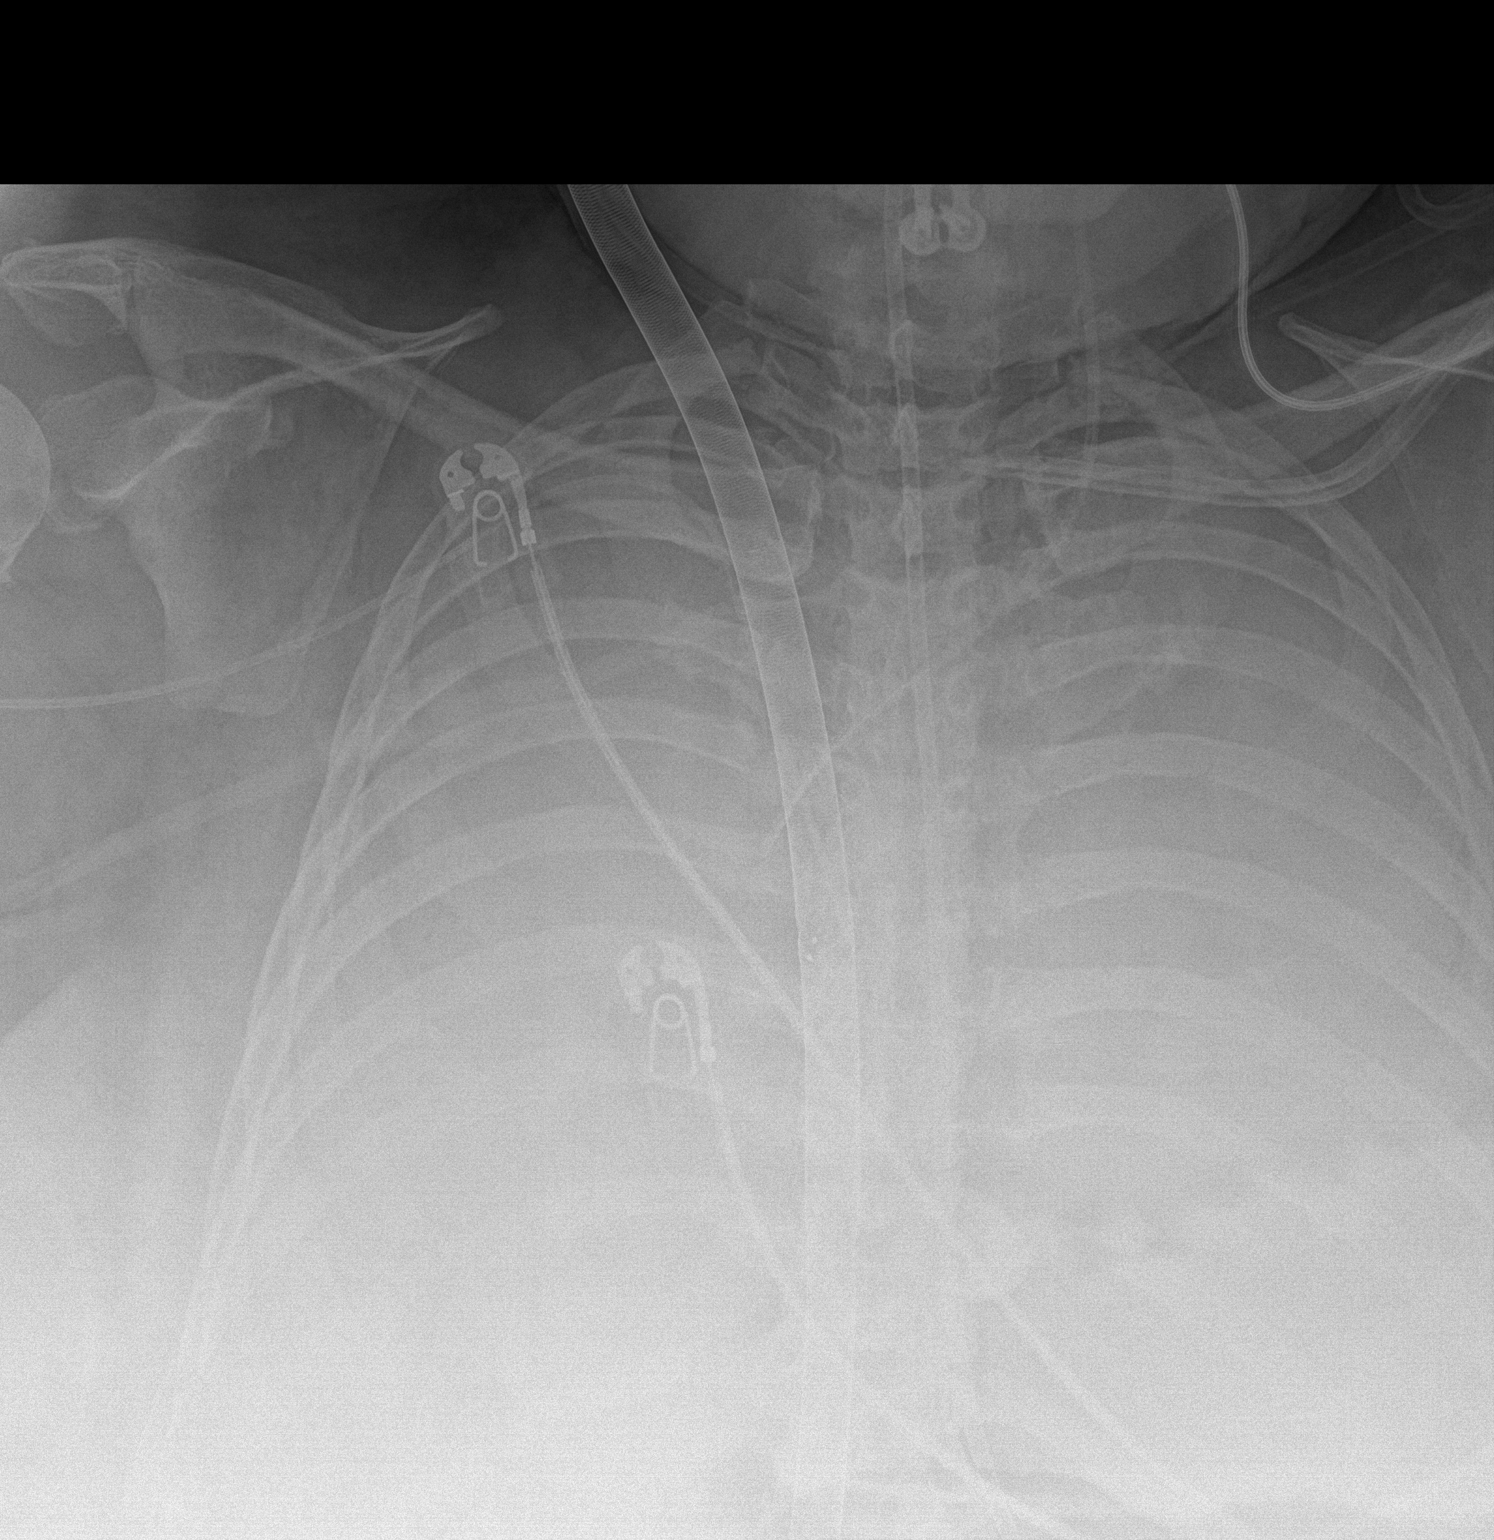

[1 of 1 positions shown; findings below may reference images not displayed]

FINDINGS: New left subclavian central venous catheter tip projects medial to
the left clavicular head. The catheter crosses the left IJ catheter
and its position is unclear. It may be indenting medial wall of the
junction of the left internal jugular and subclavian veins. ECMO
cannula, feeding tube and left IJ catheter are unchanged. There is
complete whiteout of the chest bilaterally. Cardiac silhouette is
not visible.
IMPRESSION: Position of the tip of the patient's left subclavian catheter tip is
indeterminate. It may be indenting the medial wall of the junction
of the left subclavian and left internal jugular veins.

No change in complete whiteout of the chest.

Critical Value/emergent results were called by telephone at the time
of interpretation on 01/28/2020 at [DATE] to provider SINA
KNUTSEN , who verbally acknowledged these results.

## 2021-09-20 IMAGING — DX DG CHEST 1V PORT
1 series · 1 of 1 positions shown · non-contrast
Comparison: 01/28/2020

CLINICAL DATA: COVID 19 virus infection. Acute respiratory failure.
On ECMO.

EXAM:
PORTABLE CHEST 1 VIEW

[chest ap]
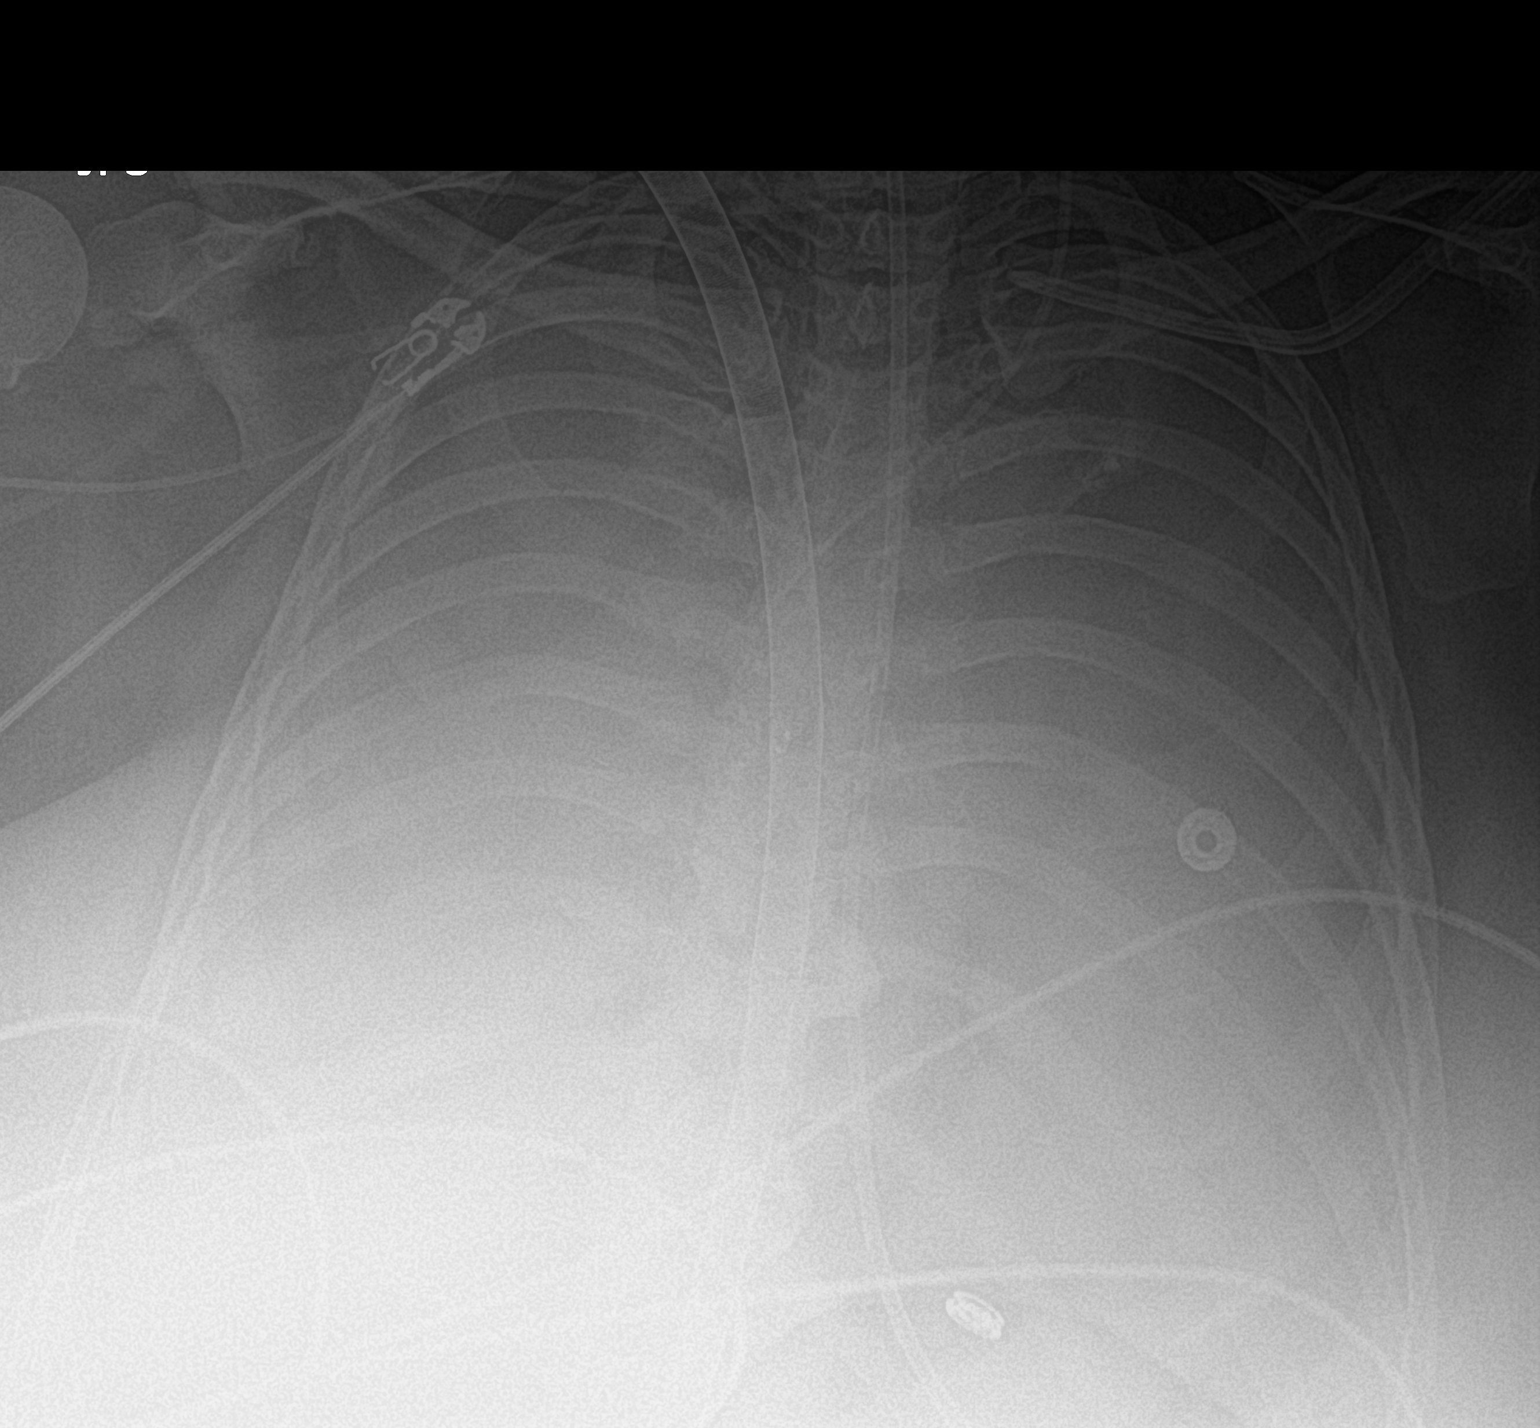

[1 of 1 positions shown; findings below may reference images not displayed]

FINDINGS: Left subclavian central venous catheter tip is again seen overlying
the distal left subclavian vein. Left jugular central venous
catheter tip remains in the proximal SVC. Feeding tube remains in
place as well as ECMO catheter.

Complete opacification of bilateral hemi-thoraces again seen,
without significant change.
IMPRESSION: Complete opacification of bilateral hemi-thoraces, without
significant change.

Stable support lines and tubes.

## 2021-09-21 IMAGING — DX DG CHEST 1V PORT
1 series · 1 of 1 positions shown · non-contrast
Comparison: January 29, 2020.

CLINICAL DATA: Respiratory failure

EXAM:
PORTABLE CHEST 1 VIEW

[chest]
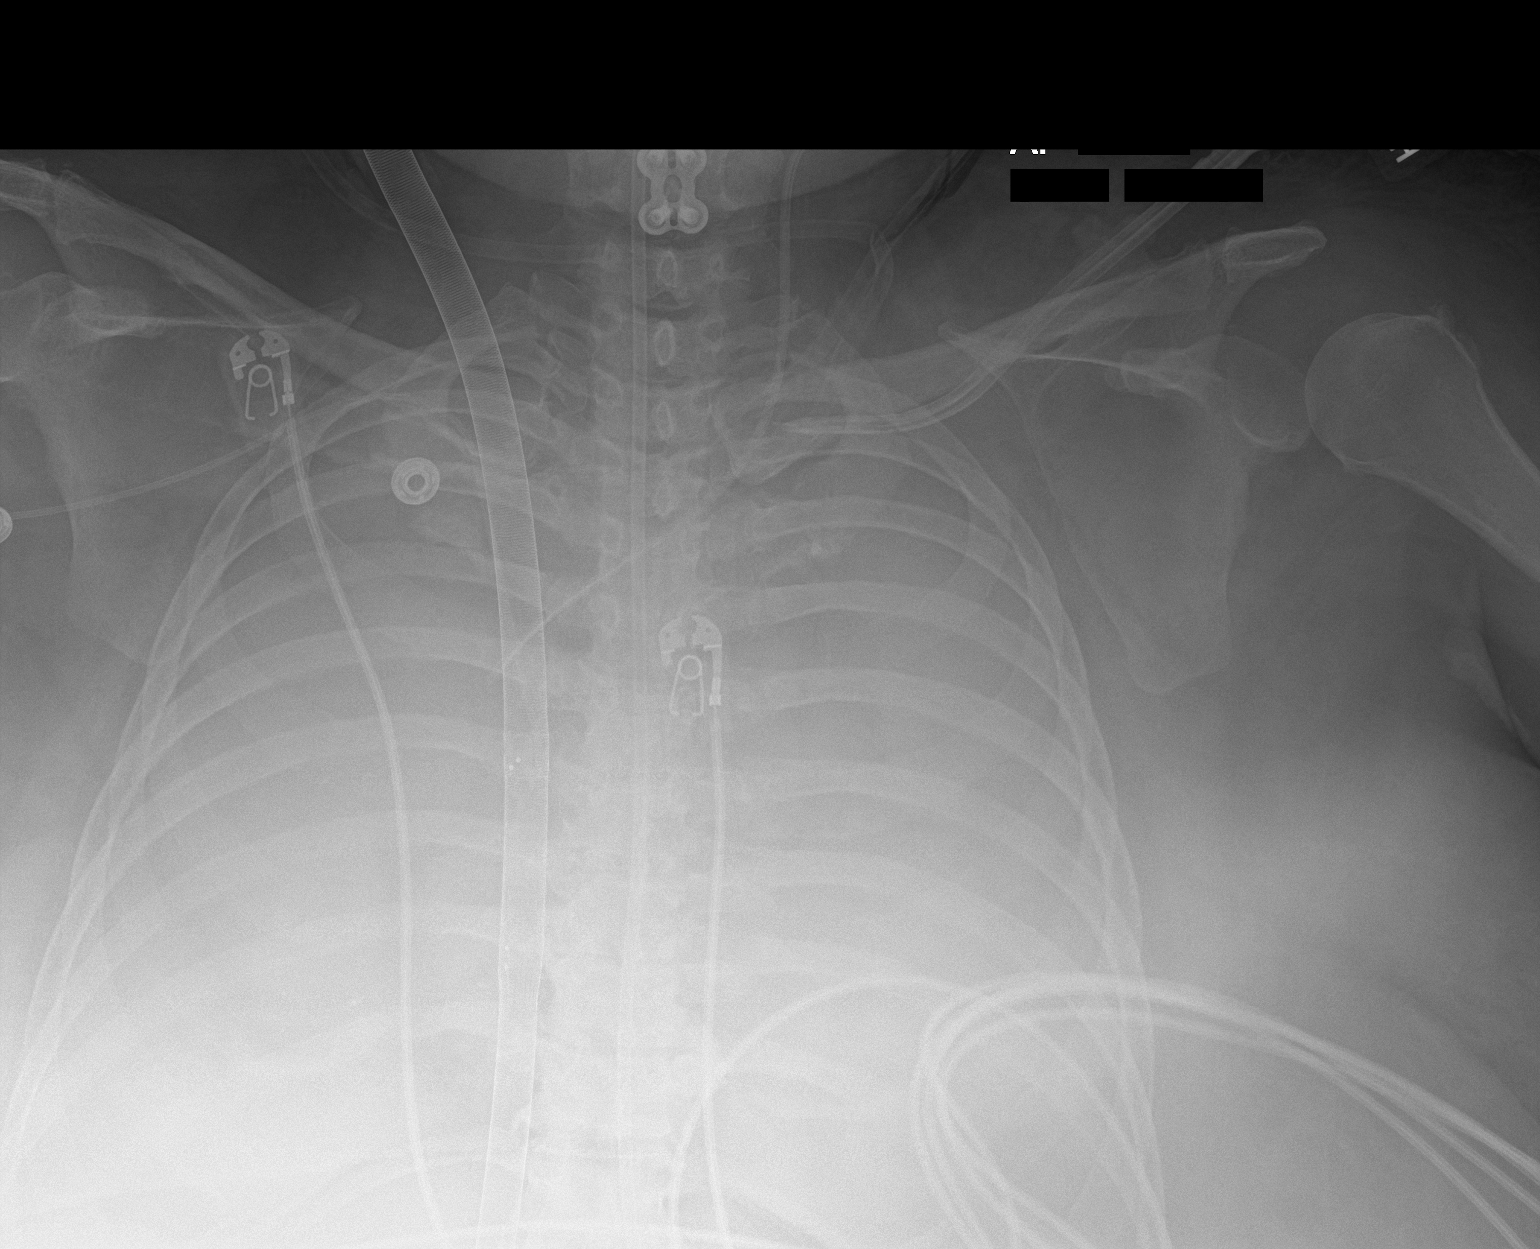

[1 of 1 positions shown; findings below may reference images not displayed]

FINDINGS: ECMO catheter noted on the right with tip below the diaphragm. Left
jugular catheter tip is in the superior vena cava, stable. Left
subclavian catheter tip in the left subclavian vein near the
junction with the left innominate vein, stable. Feeding tube tip is
below the diaphragm. No pneumothorax. There is diffuse airspace
opacity bilaterally, stable. There may be superimposed pleural
effusions. Cardiac border difficult to discern given the diffuse
airspace opacities. No gross change in cardiac silhouette.
IMPRESSION: Tube and catheter positions as described without pneumothorax.
Widespread airspace opacity bilaterally with potential superimposed
effusions. Appearance essentially stable compared to 1 day prior.

## 2021-09-21 IMAGING — DX DG CHEST 1V PORT
1 series · 2 of 2 positions shown · non-contrast
Comparison: One-view chest x-ray 01/30/2019

CLINICAL DATA: ECMO.  ARDS due to COVID 19 virus.

EXAM:
PORTABLE CHEST 1 VIEW

[Series 1: chest · 0.14mm/px · 2 of 2 slices shown]
[im 1/2]
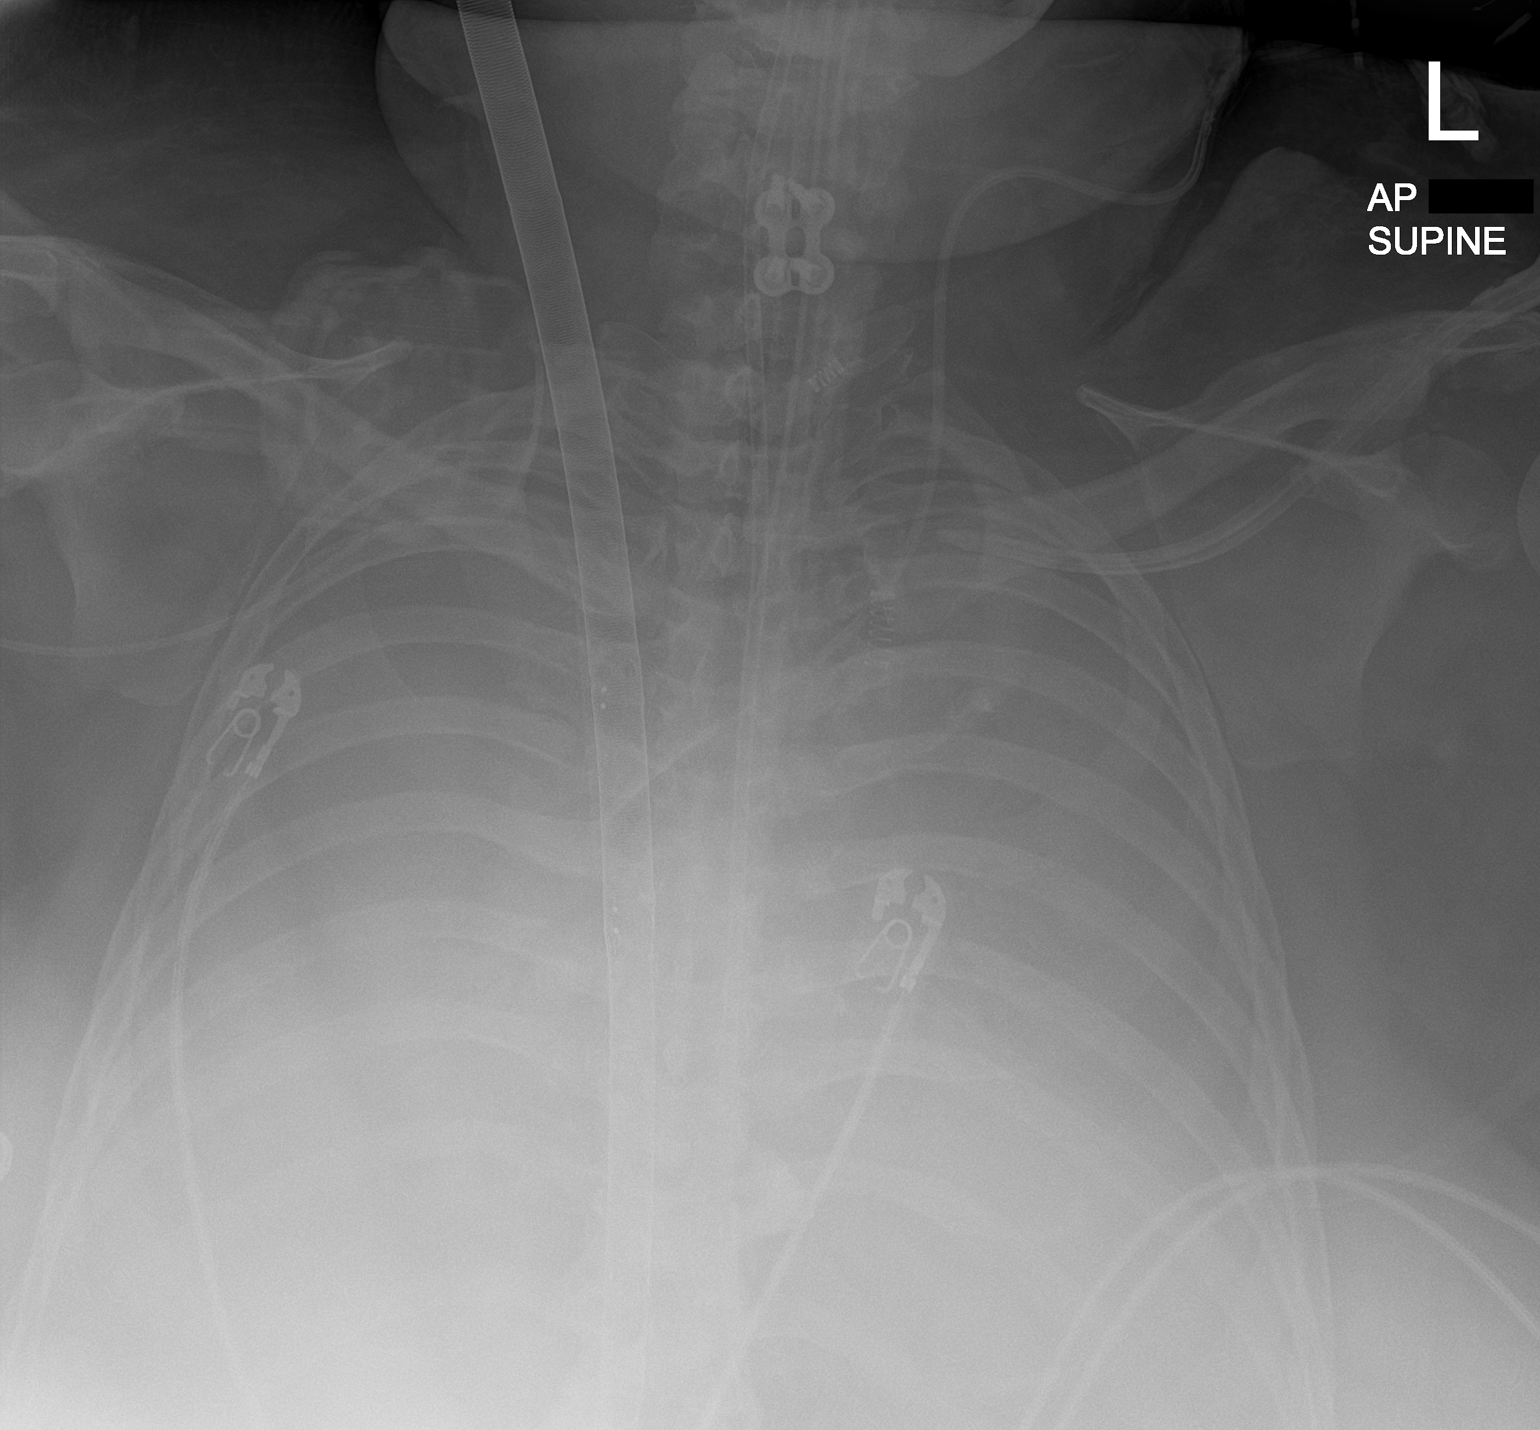
[im 2/2]
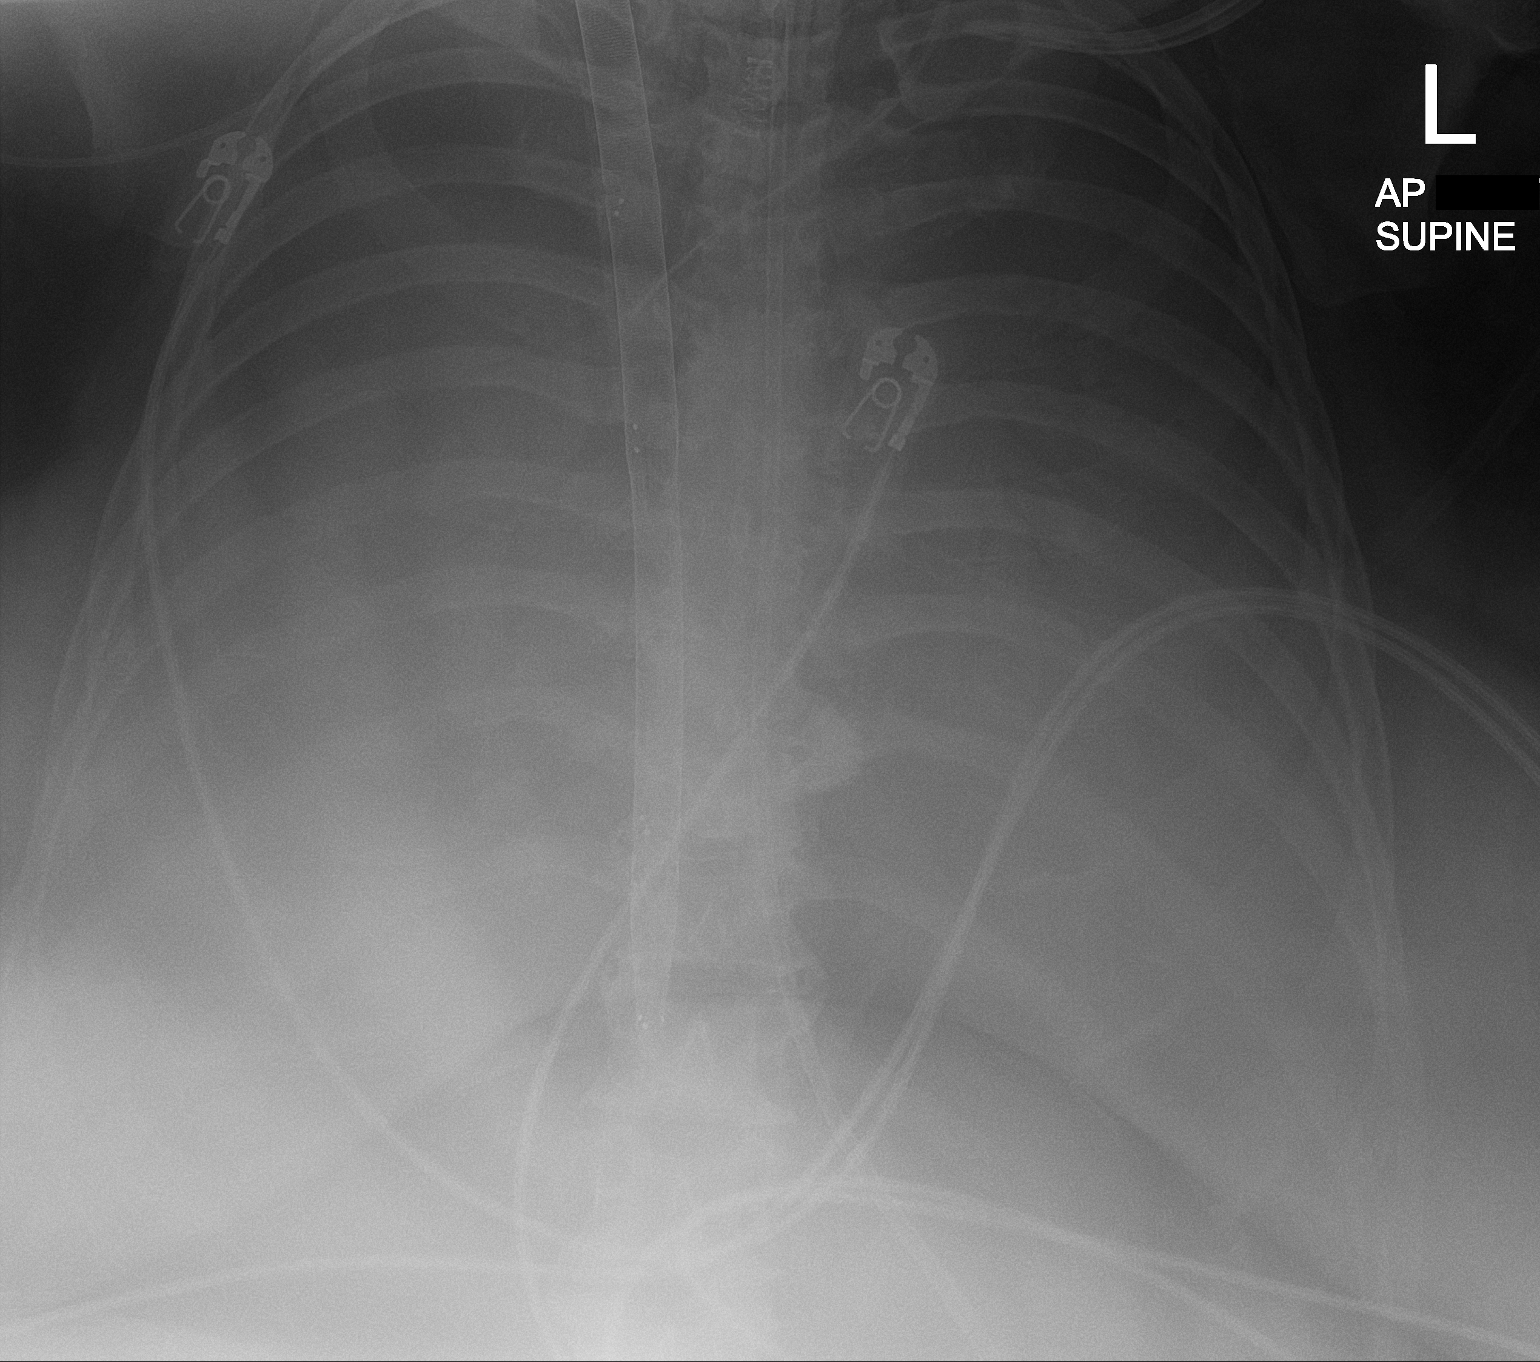

[2 of 2 positions shown; findings below may reference images not displayed]

FINDINGS: Heart is obscured by airspace disease. The tip of the ECMO catheter
is at the level of T12, the inferior cavoatrial junction. Left IJ
line, endotracheal tube, and enteric tube are stable. The lungs are
diffusely opacified bilaterally. Air bronchograms are noted.
IMPRESSION: 1. Stable appearance of diffuse bilateral interstitial and airspace
disease, compatible ARDS/infection.
2. The tip of the ECMO catheter is at the level of T12, the inferior
cavoatrial junction.

## 2021-09-21 IMAGING — DX DG ABD PORTABLE 1V
1 series · 1 of 1 positions shown · non-contrast
Comparison: Plain film of the abdomen dated 01/15/2020.

CLINICAL DATA: Feeding tube placement.

EXAM:
PORTABLE ABDOMEN - 1 VIEW

[abdomen]
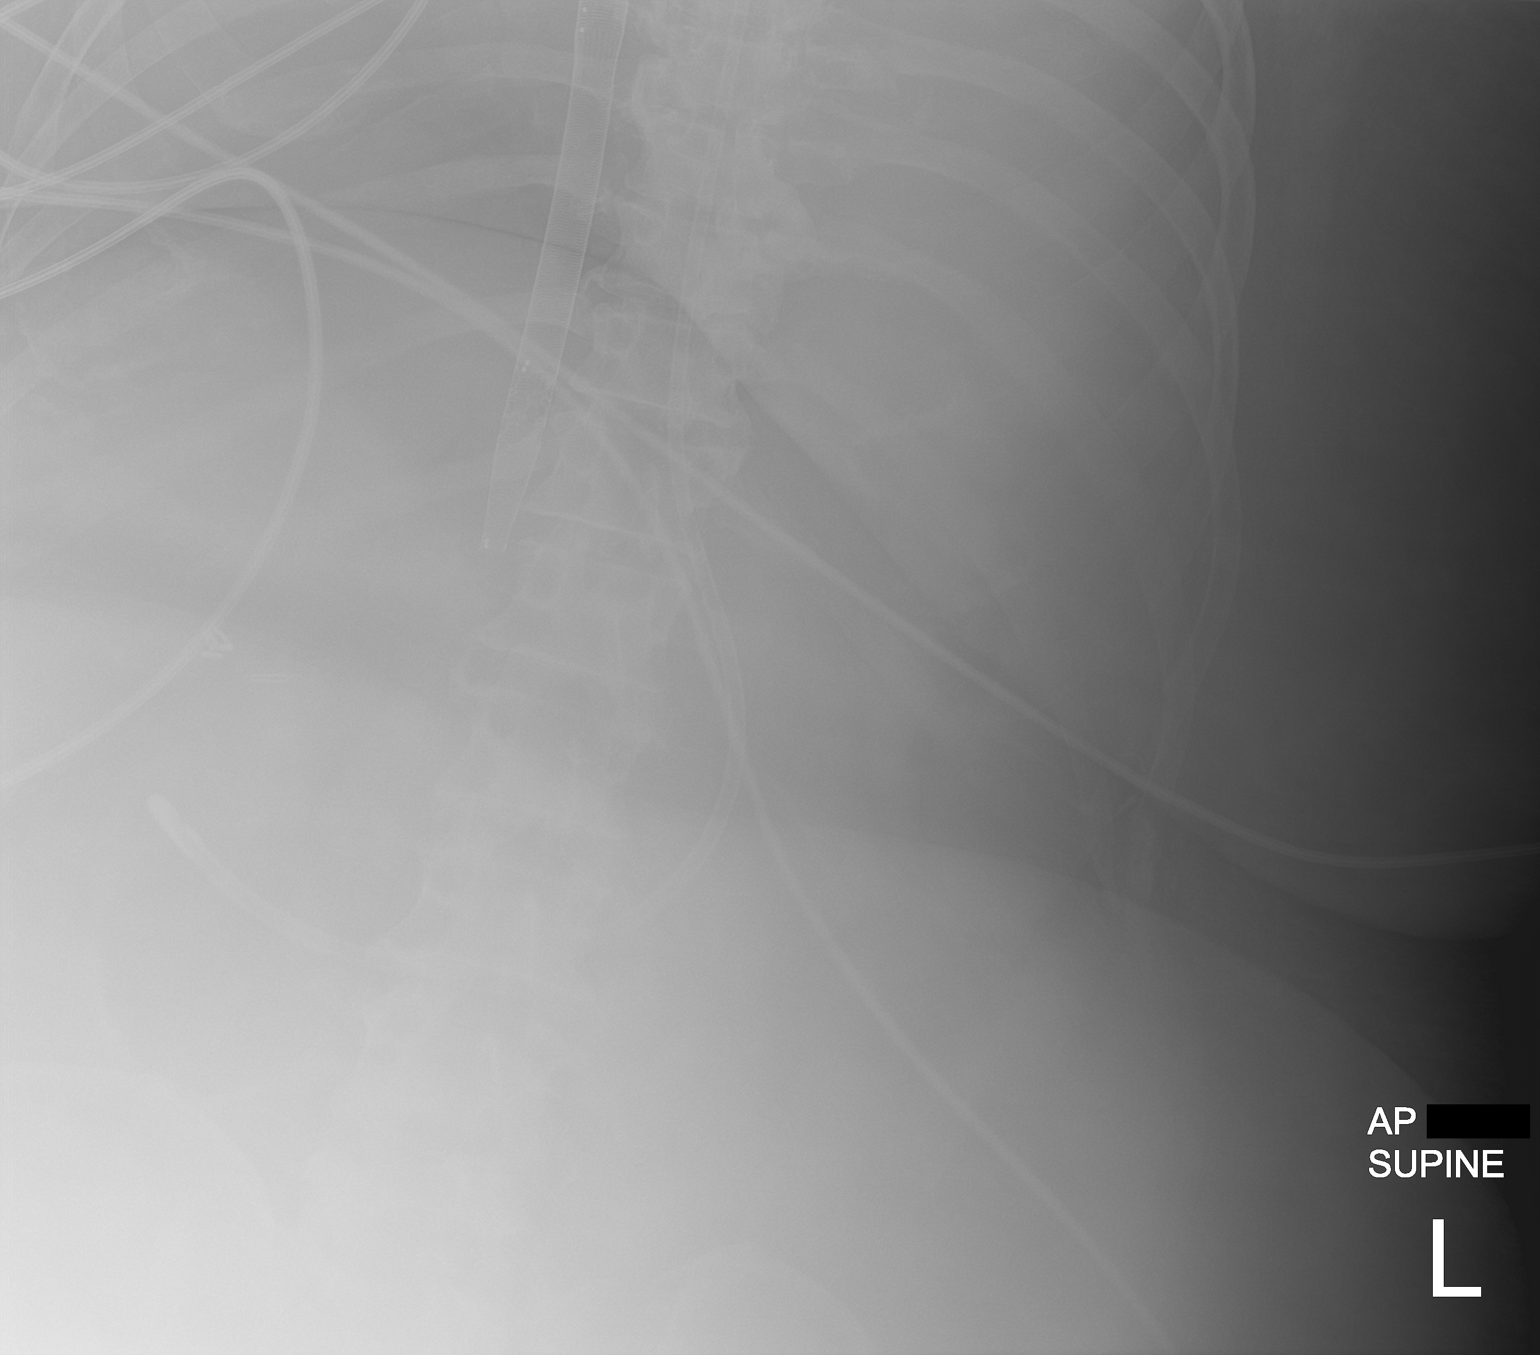

[1 of 1 positions shown; findings below may reference images not displayed]

FINDINGS: Weighted tip feeding tube appears appropriately positioned in the
stomach with tip directed towards the stomach pylorus/duodenal bulb.
ECMO cannula in place.
IMPRESSION: Weighted tip feeding tube appears appropriately positioned in the
stomach with tip directed towards the stomach pylorus/duodenal bulb.

## 2021-09-22 IMAGING — DX DG CHEST 1V PORT
1 series · 1 of 1 positions shown · non-contrast
Comparison: January 30, 2020

CLINICAL DATA: Respiratory failure

EXAM:
PORTABLE CHEST 1 VIEW

[chest]
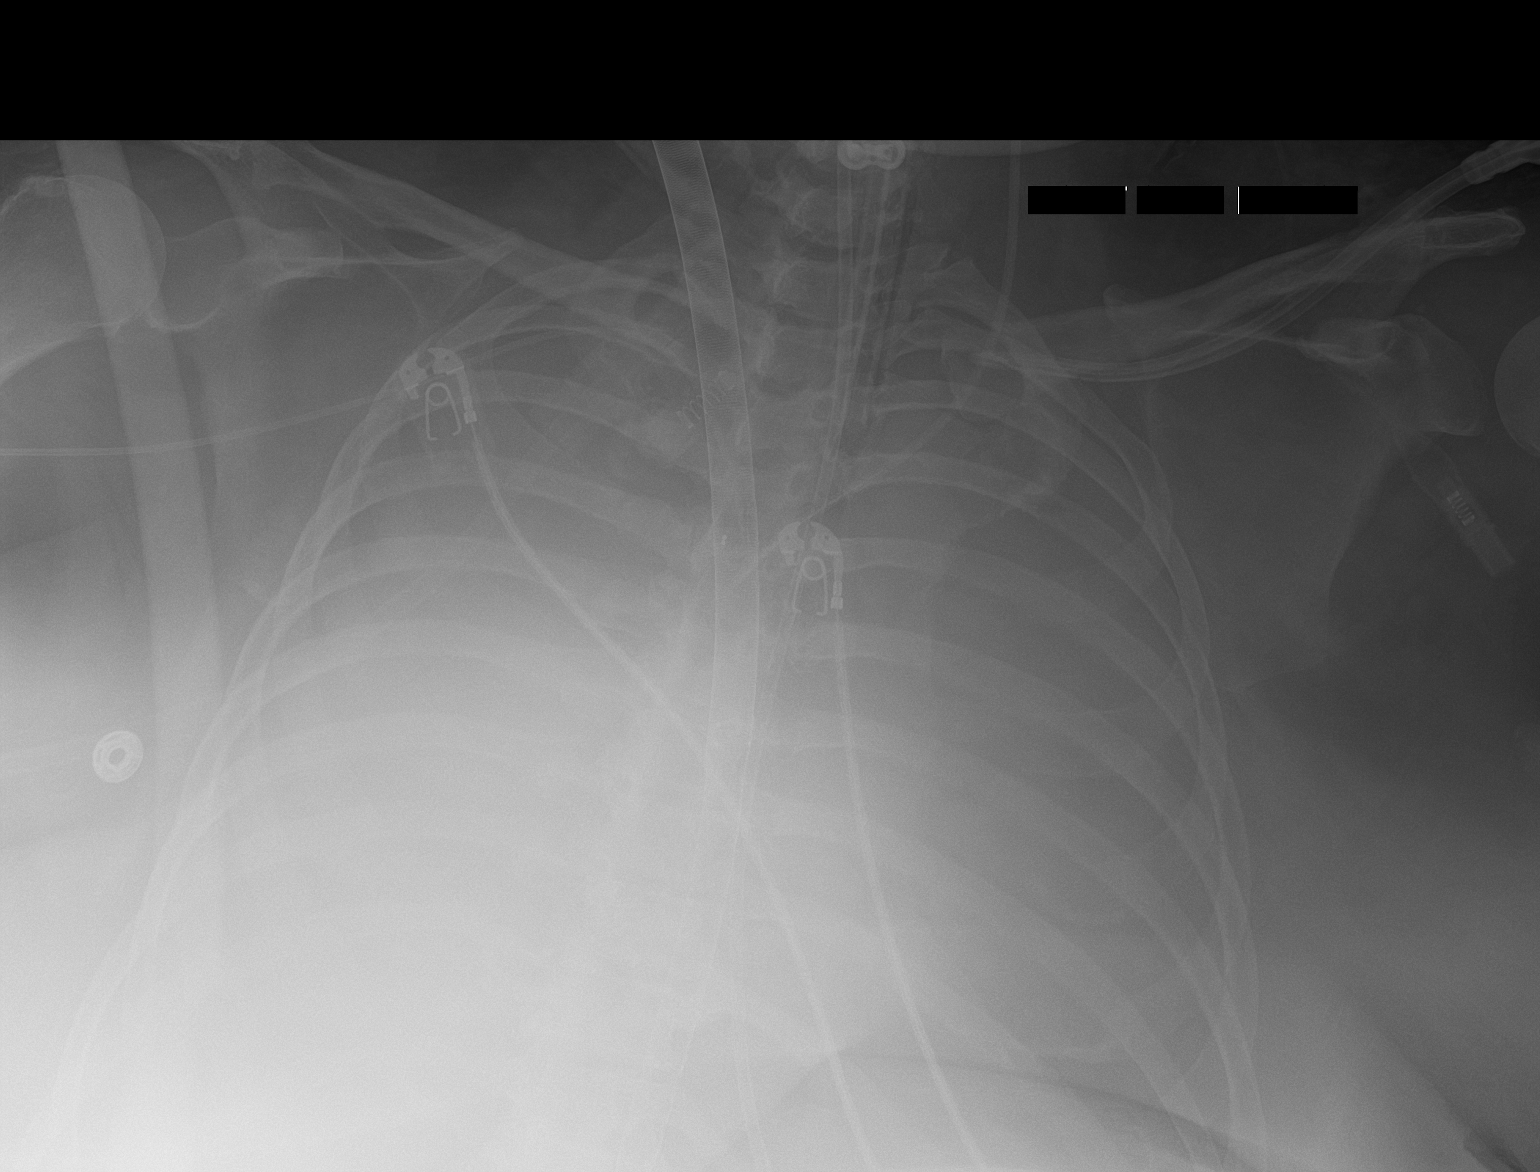

[1 of 1 positions shown; findings below may reference images not displayed]

FINDINGS: Endotracheal tube tip is 4.5 cm above the carina. ECMO catheter
present on the right with tip below diaphragm. Feeding tube tip is
below the diaphragm. Left jugular catheter tip is in the superior
vena cava. No pneumothorax. There is persistent diffuse airspace
opacity bilaterally with suspected underlying pleural effusions.
Grossly stable cardiac silhouette.
IMPRESSION: Tube and catheter positions as described without pneumothorax.
Diffuse airspace opacity bilaterally with suspected associated
pleural effusions. Appearance indicative of ARDS superimposed with
infection and/or edema. No appreciable change from 1 day prior.

## 2021-09-23 IMAGING — DX DG CHEST 1V PORT
2 series · 2 of 2 positions shown · non-contrast
Comparison: January 31, 2020

CLINICAL DATA: ECMO

EXAM:
PORTABLE CHEST 1 VIEW

[chest ap (1 of 2)]
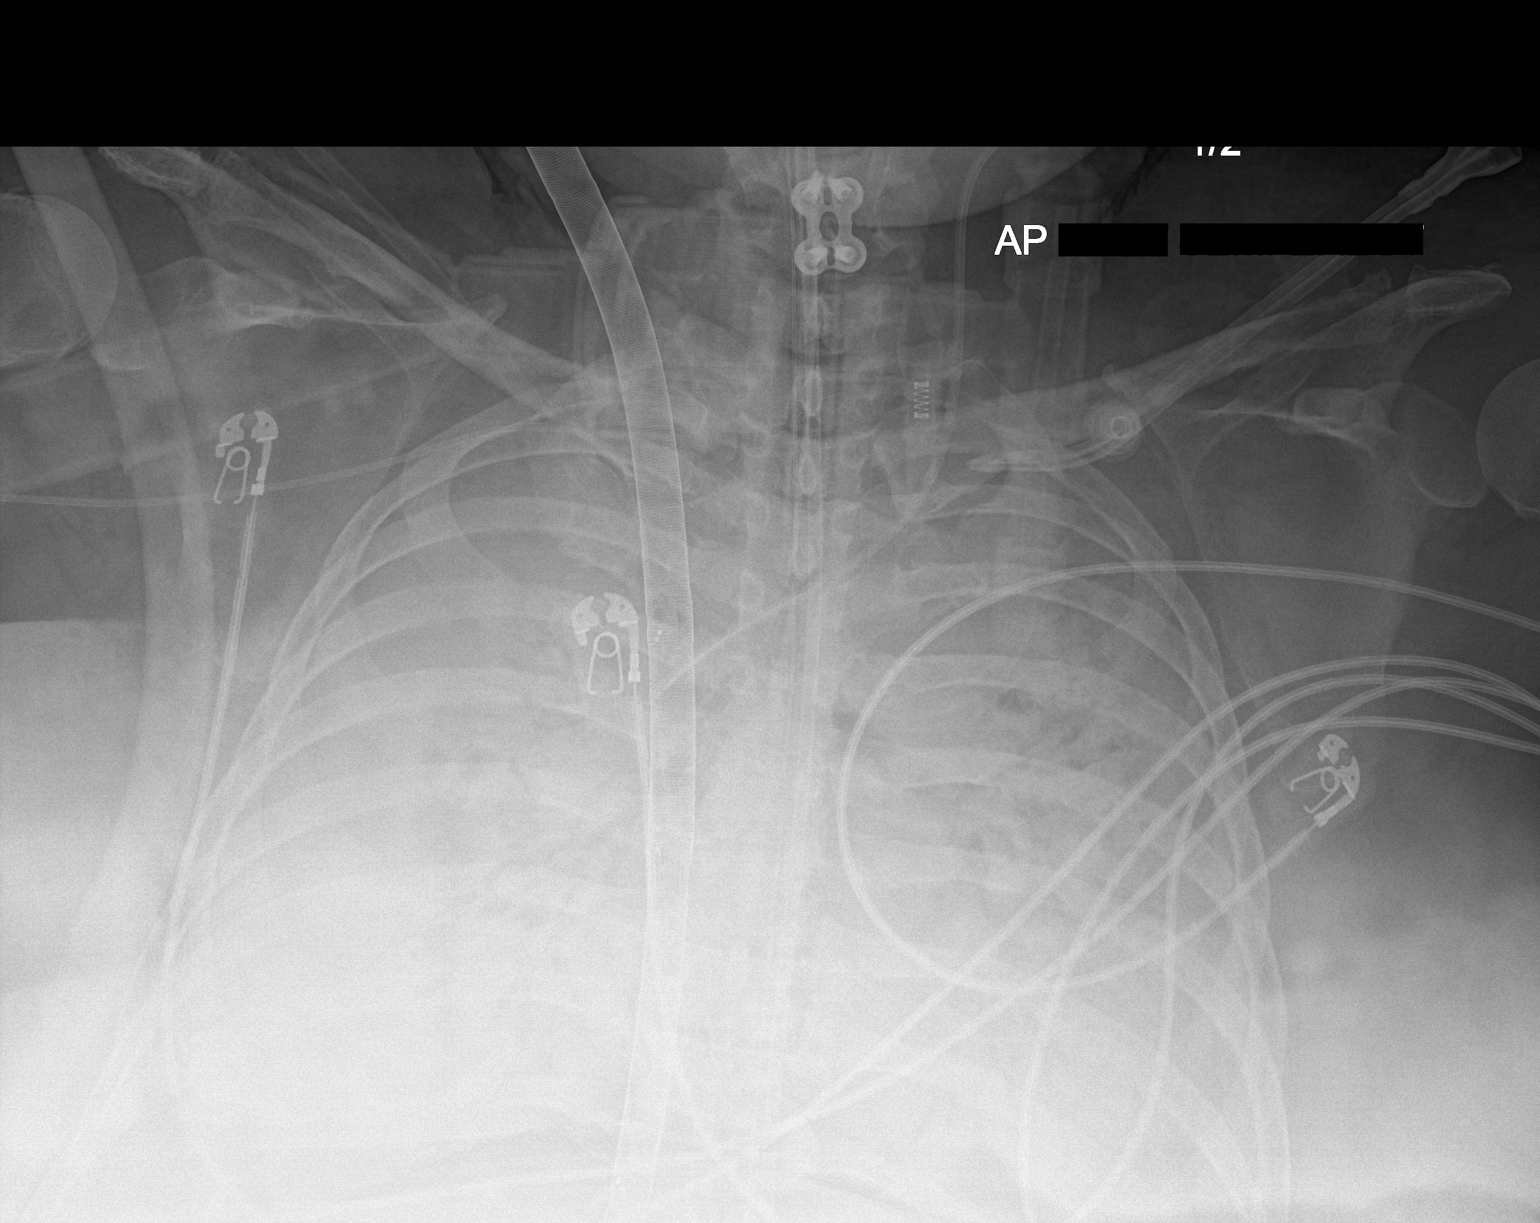

[chest ap (2 of 2)]
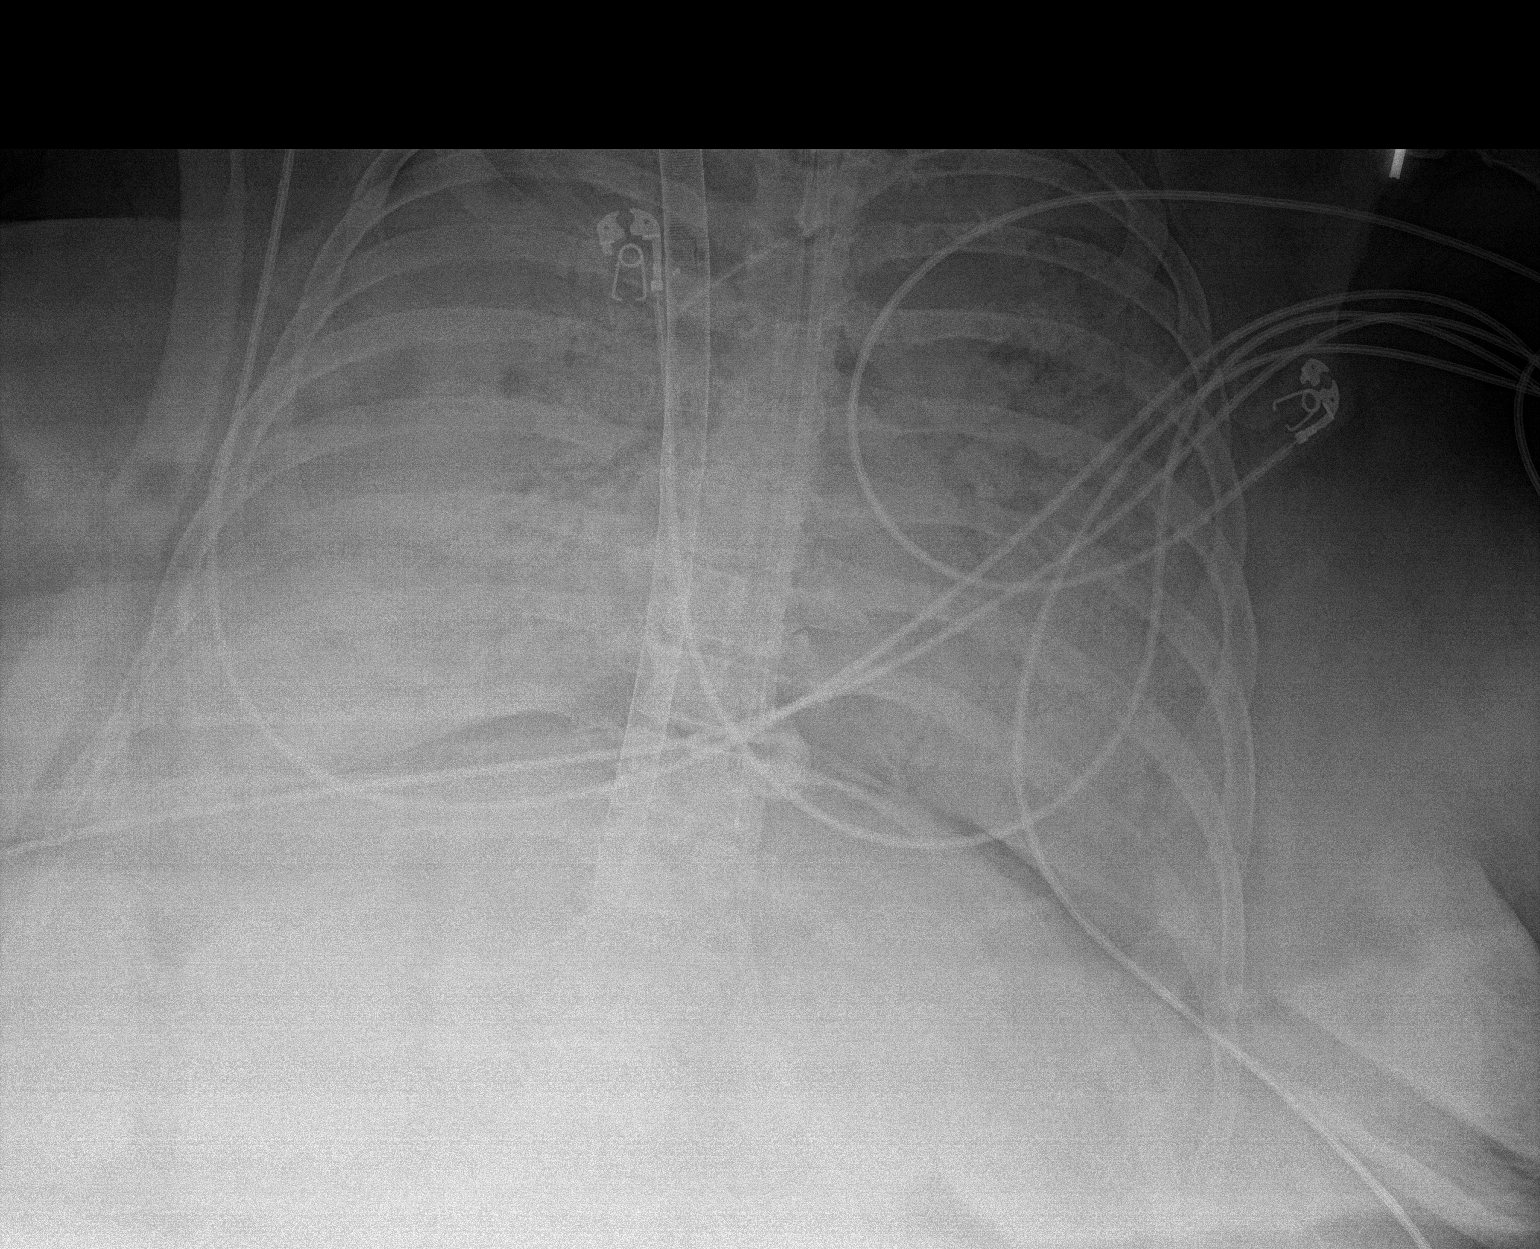

[2 of 2 positions shown; findings below may reference images not displayed]

FINDINGS: The cardiomediastinal silhouette is obscured.ETT tip terminates
cm above the carina. The enteric tube courses through the chest to
the abdomen beyond the field-of-view. Esophageal temperature probe.
ECMO cannulation support apparatus. LEFT IJ CVC tip terminates over
the confluence of LEFT brachiocephalic vein and the SVC. LEFT
subclavian approach CVC tip terminates over the region of the LEFT
subclavian vessels. RIGHT upper extremity PICC tip is obscured by
overlying ECMO cannulation support apparatus but is favored to
terminate over the region of the RIGHT subclavian vessels. No
pneumothorax. Near complete opacification of bilateral lungs with
scattered air bronchograms bilaterally. Visualized abdomen is
unremarkable. Multilevel degenerative changes of the thoracic spine.
IMPRESSION: 1.  Support apparatus as described above.
2. Near complete opacification of bilateral lungs with scattered air
bronchograms bilaterally.

## 2021-09-24 IMAGING — DX DG CHEST 1V PORT
1 series · 2 of 2 positions shown · non-contrast
Comparison: 02/01/2020

CLINICAL DATA: ETT, respiratory failure, on ECMO

EXAM:
PORTABLE CHEST 1 VIEW

[Series 1: chest · 0.14mm/px · 2 of 2 slices shown]
[im 1/2]
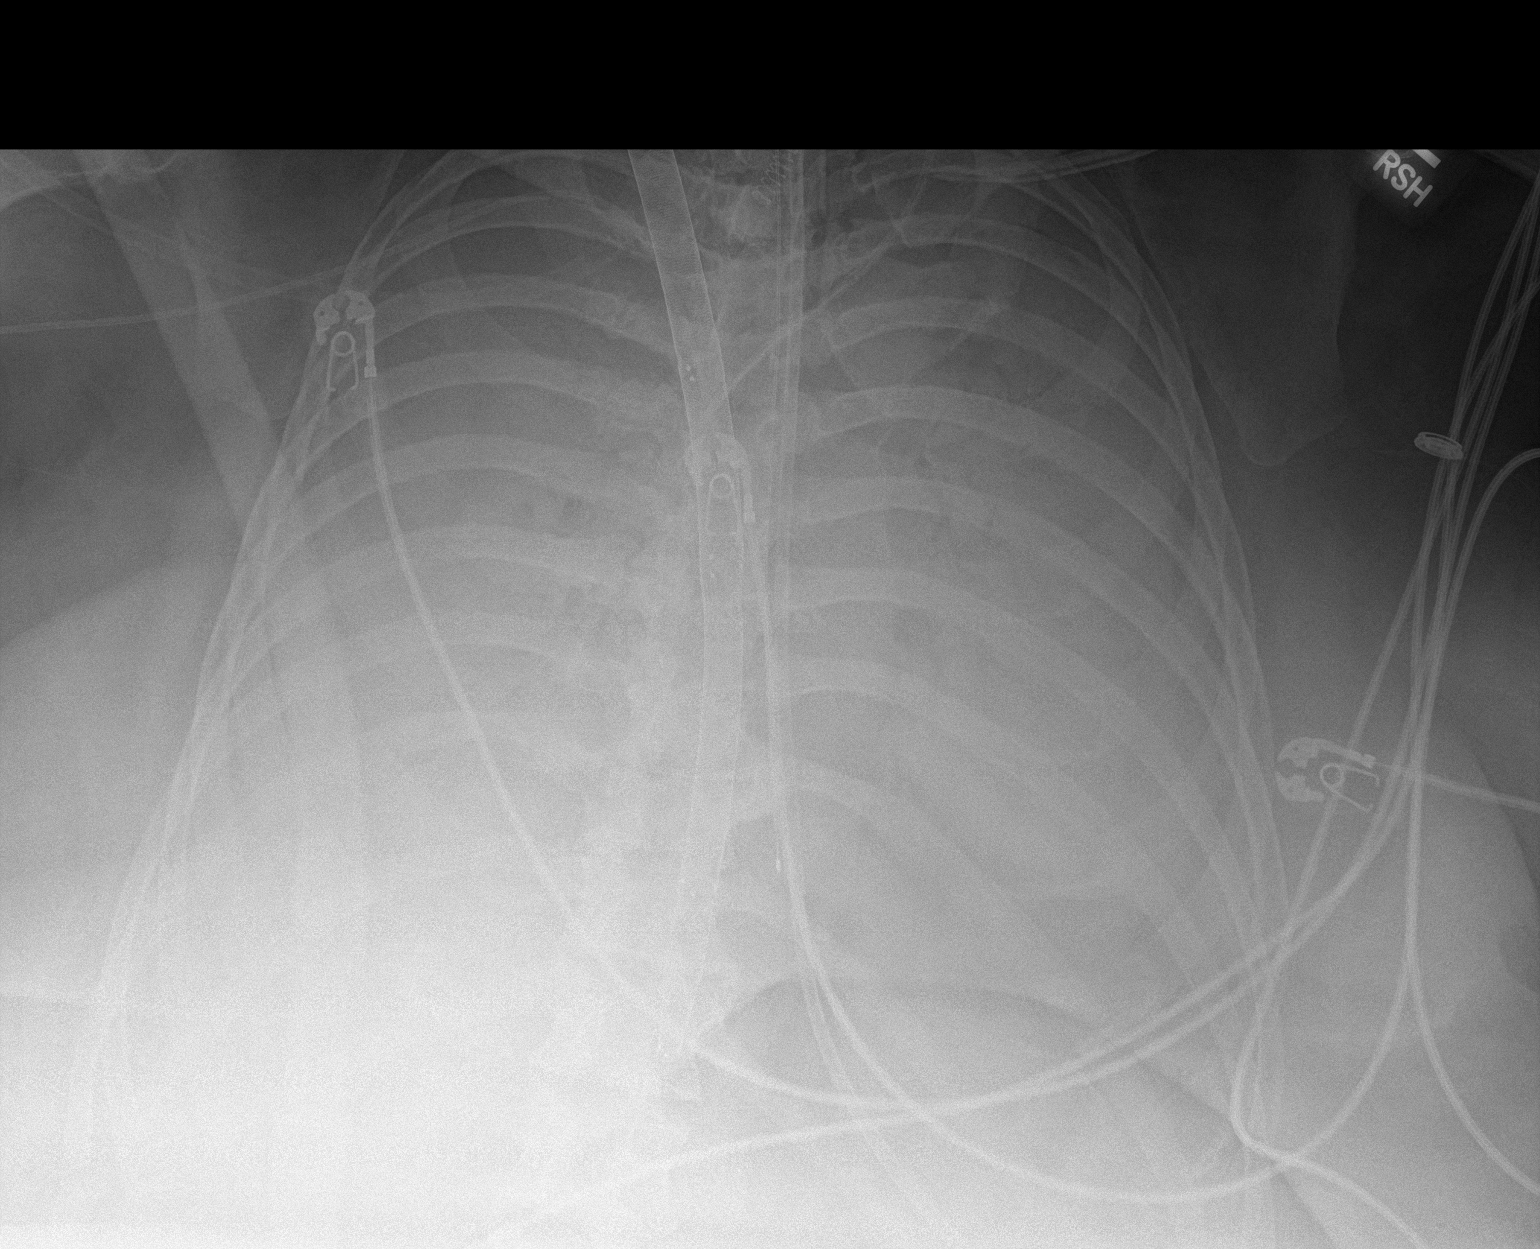
[im 2/2]
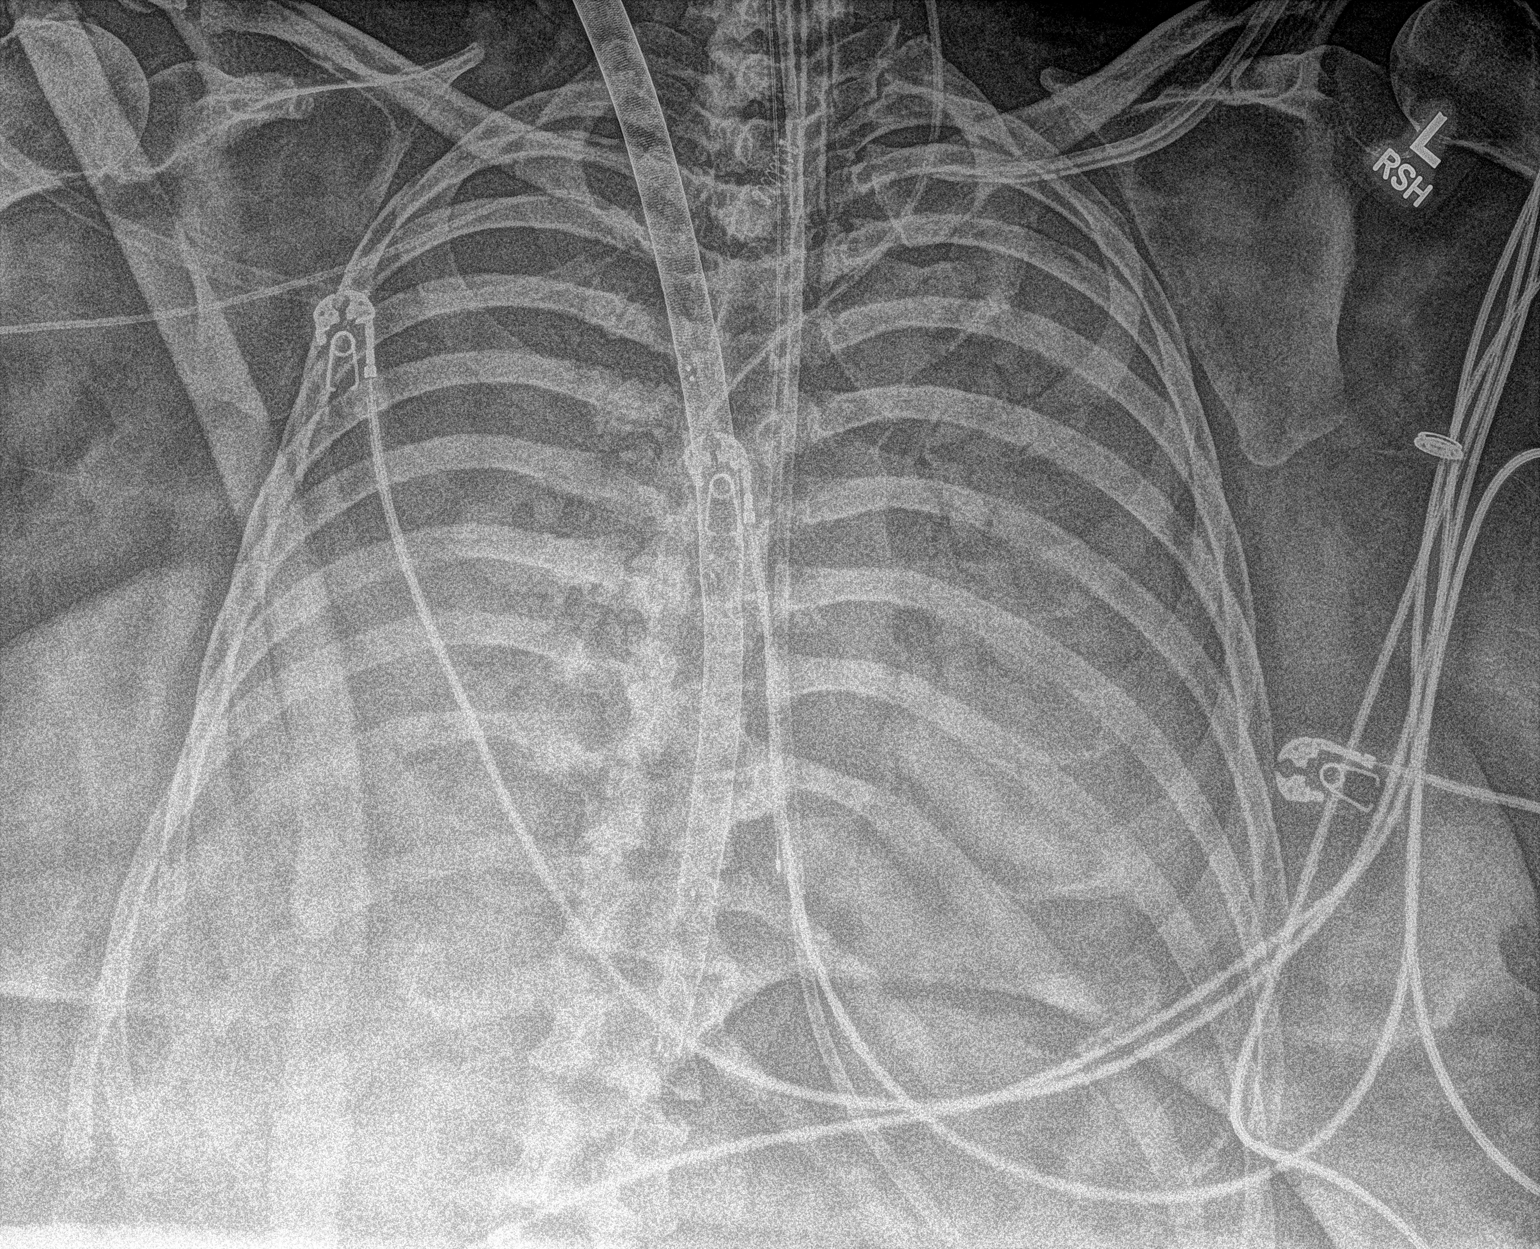

[2 of 2 positions shown; findings below may reference images not displayed]

FINDINGS: Diffuse opacification of the lungs bilaterally with associated ECMO
catheter.

Endotracheal tube terminates 4.5 cm above the carina. Left IJ venous
catheter terminates in the mid SVC.

Enteric tube courses into the stomach.

Cardiomediastinal silhouette is obscured.
IMPRESSION: Diffuse opacification of the lungs bilaterally with associated ECMO
catheter.

Endotracheal tube terminates 4.5 cm above the carina. Additional
support apparatus as above.

## 2021-09-25 IMAGING — DX DG CHEST 1V PORT
1 series · 1 of 1 positions shown · non-contrast
Comparison: February 02, 2020

CLINICAL DATA: COVID positive with respiratory distress.

EXAM:
PORTABLE CHEST 1 VIEW

[chest]
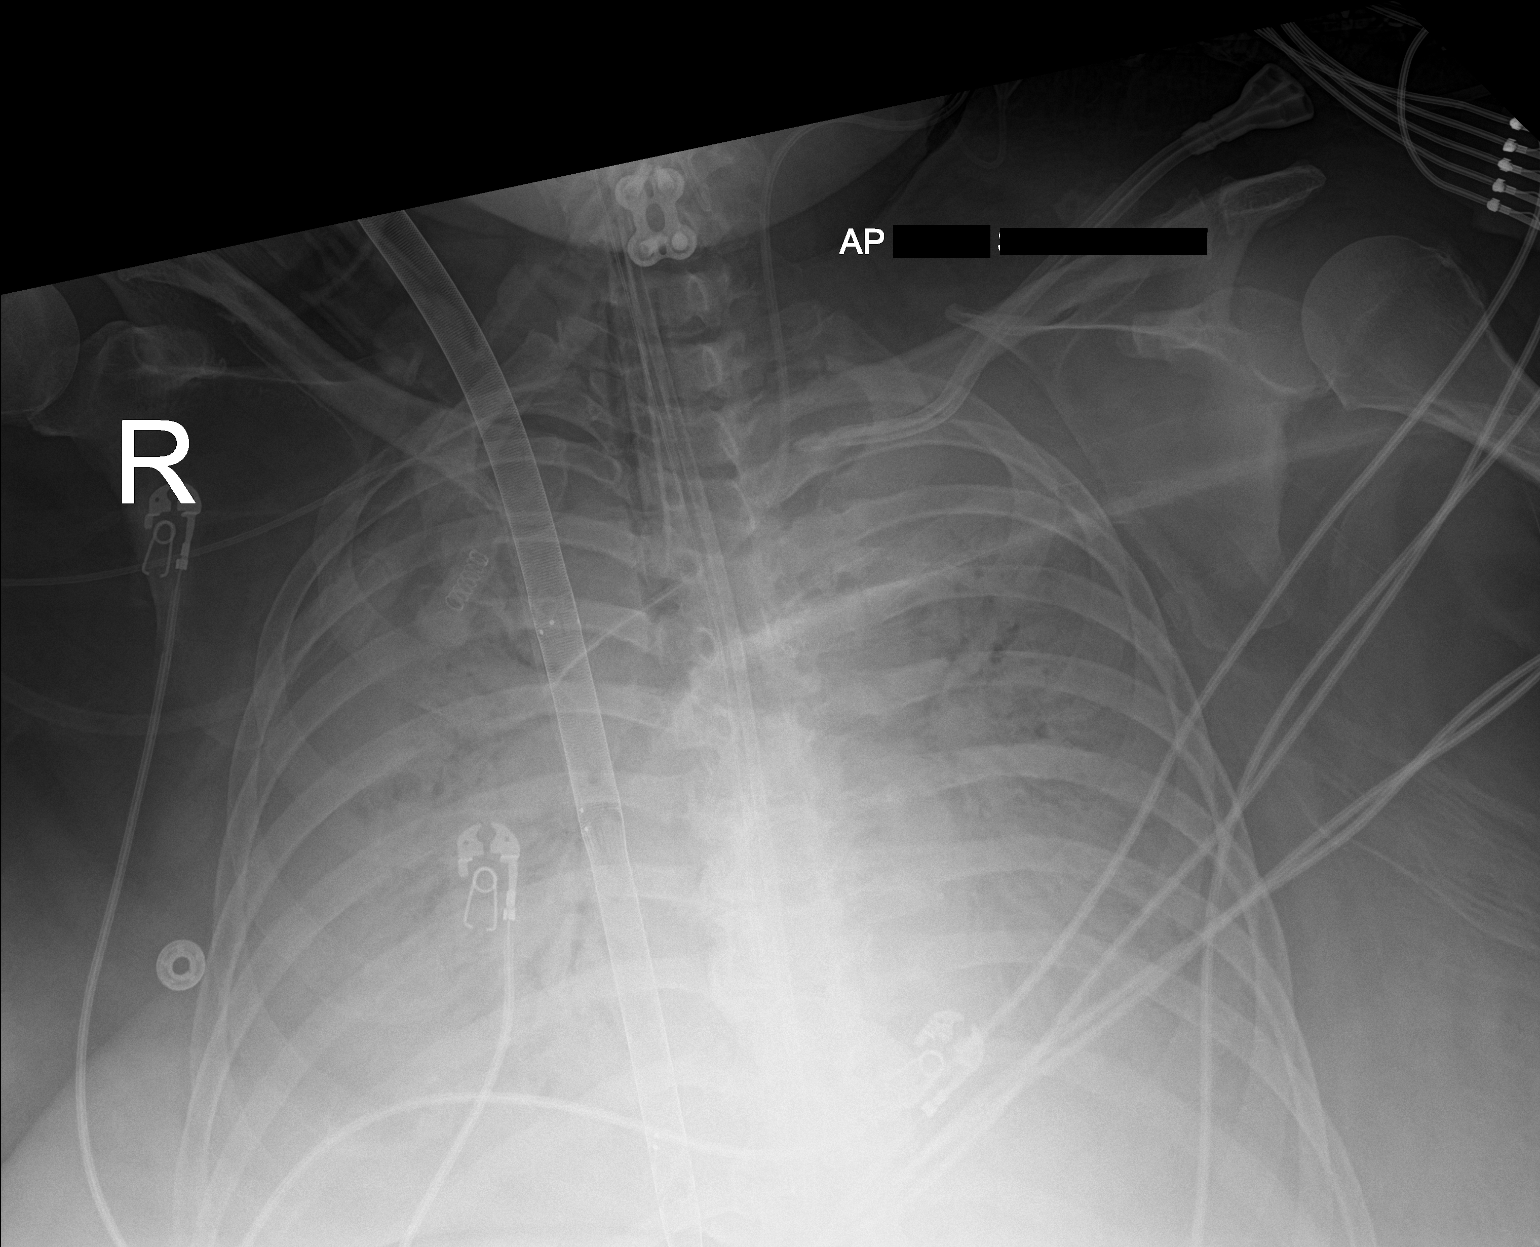

[1 of 1 positions shown; findings below may reference images not displayed]

FINDINGS: There is stable endotracheal tube, nasogastric tube and left
internal jugular venous catheter positioning. Marked severity
opacification of both lungs is seen with a very mild amount of
aerated perihilar regions, bilaterally. This is very mildly improved
when compared to the prior study. The heart size and mediastinal
contours are subsequently limited in evaluation. A radiopaque fusion
plate and screws are seen overlying the cervical spine. The
visualized skeletal structures are otherwise unremarkable.
IMPRESSION: Marked severity opacification of both lungs with a very mild amount
of aerated perihilar regions, bilaterally.

## 2021-09-26 IMAGING — DX DG CHEST 1V PORT
1 series · 1 of 1 positions shown · non-contrast
Comparison: February 03, 2020

CLINICAL DATA: ECMO.

EXAM:
PORTABLE CHEST 1 VIEW

[chest ap]
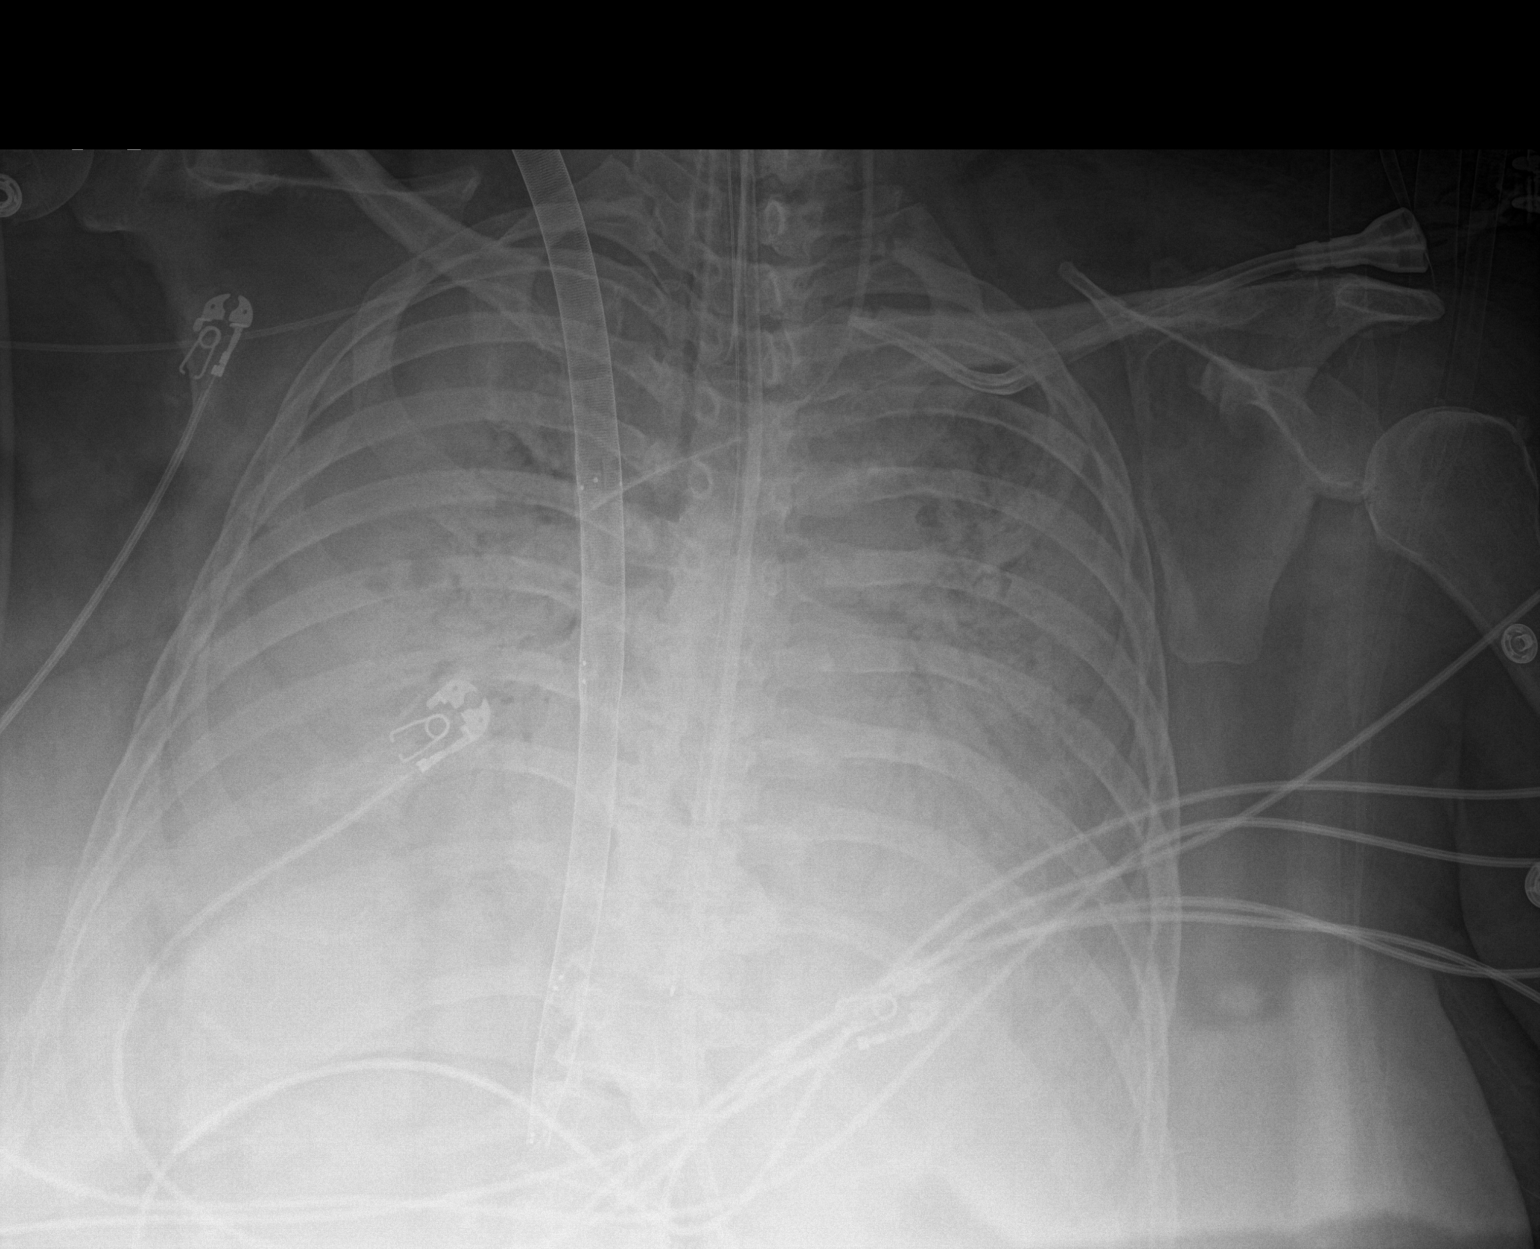

[1 of 1 positions shown; findings below may reference images not displayed]

FINDINGS: The ETT is in good position. The feeding tube terminates below
today's film. A left central line is stable. A right PICC line is
again seen although the distal tip is difficult to visualize. An
ECMO device remains. Near complete opacification of the lungs
remains. No other changes. A left subclavian central line is stable.
IMPRESSION: 1. Support apparatus as above.
2. Near complete opacification of the lungs is stable.

## 2021-09-27 IMAGING — DX DG CHEST 1V PORT
1 series · 1 of 1 positions shown · non-contrast
Comparison: Same day.

CLINICAL DATA: Shortness of breath.

EXAM:
PORTABLE CHEST 1 VIEW

[chest]
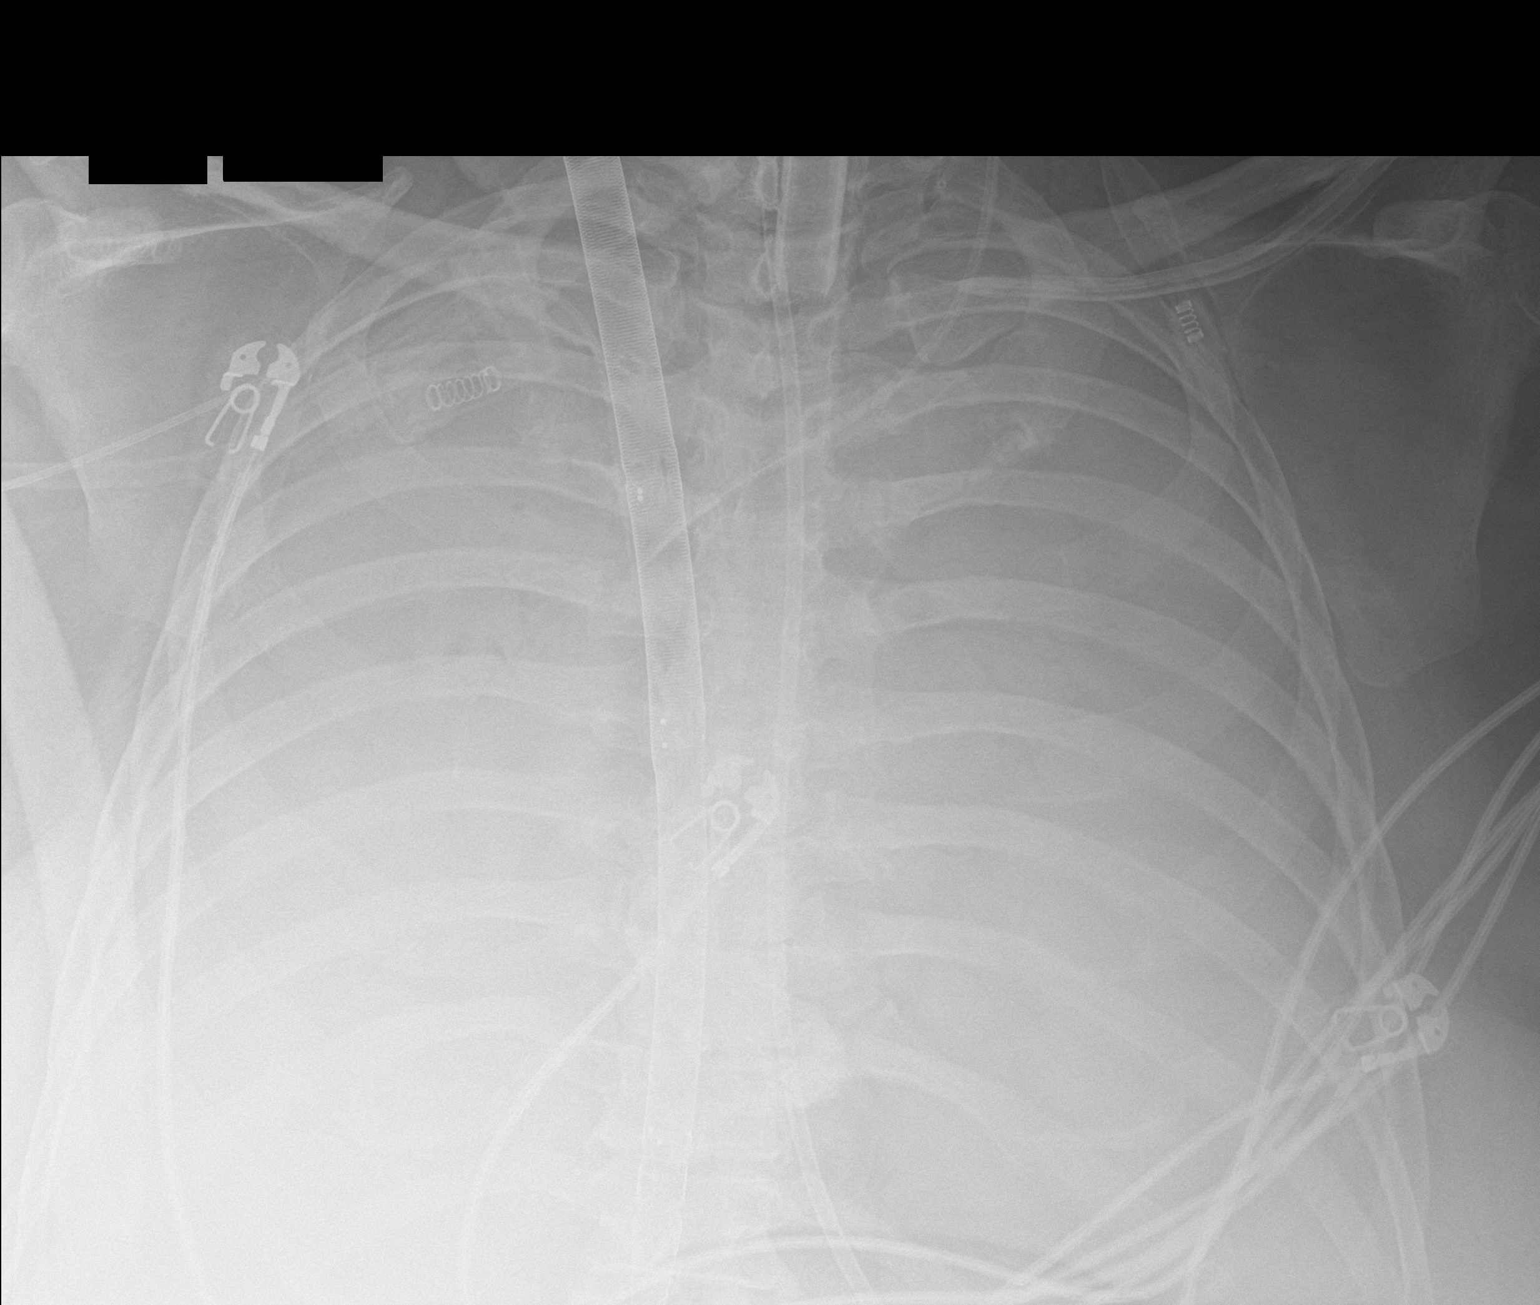

[1 of 1 positions shown; findings below may reference images not displayed]

FINDINGS: Tracheostomy tube is in good position. Feeding tube is seen entering
stomach. Right-sided PICC line is unchanged. Left internal jugular
catheter is unchanged. ECMO device is noted. Complete opacification
of the lungs is noted bilaterally suggesting pneumonia, atelectasis
or effusion. No pneumothorax is noted. Bony thorax is unremarkable.
IMPRESSION: Stable support apparatus. Complete opacification of the lungs is
noted bilaterally suggesting pneumonia, atelectasis or effusion.

## 2021-09-27 IMAGING — DX DG CHEST 1V PORT
1 series · 1 of 1 positions shown · non-contrast
Comparison: Chest CT scratch the chest x-ray 02/04/2020.

CLINICAL DATA: 54-year-old female with respiratory failure on ECMO.

EXAM:
PORTABLE CHEST 1 VIEW

[chest ap]
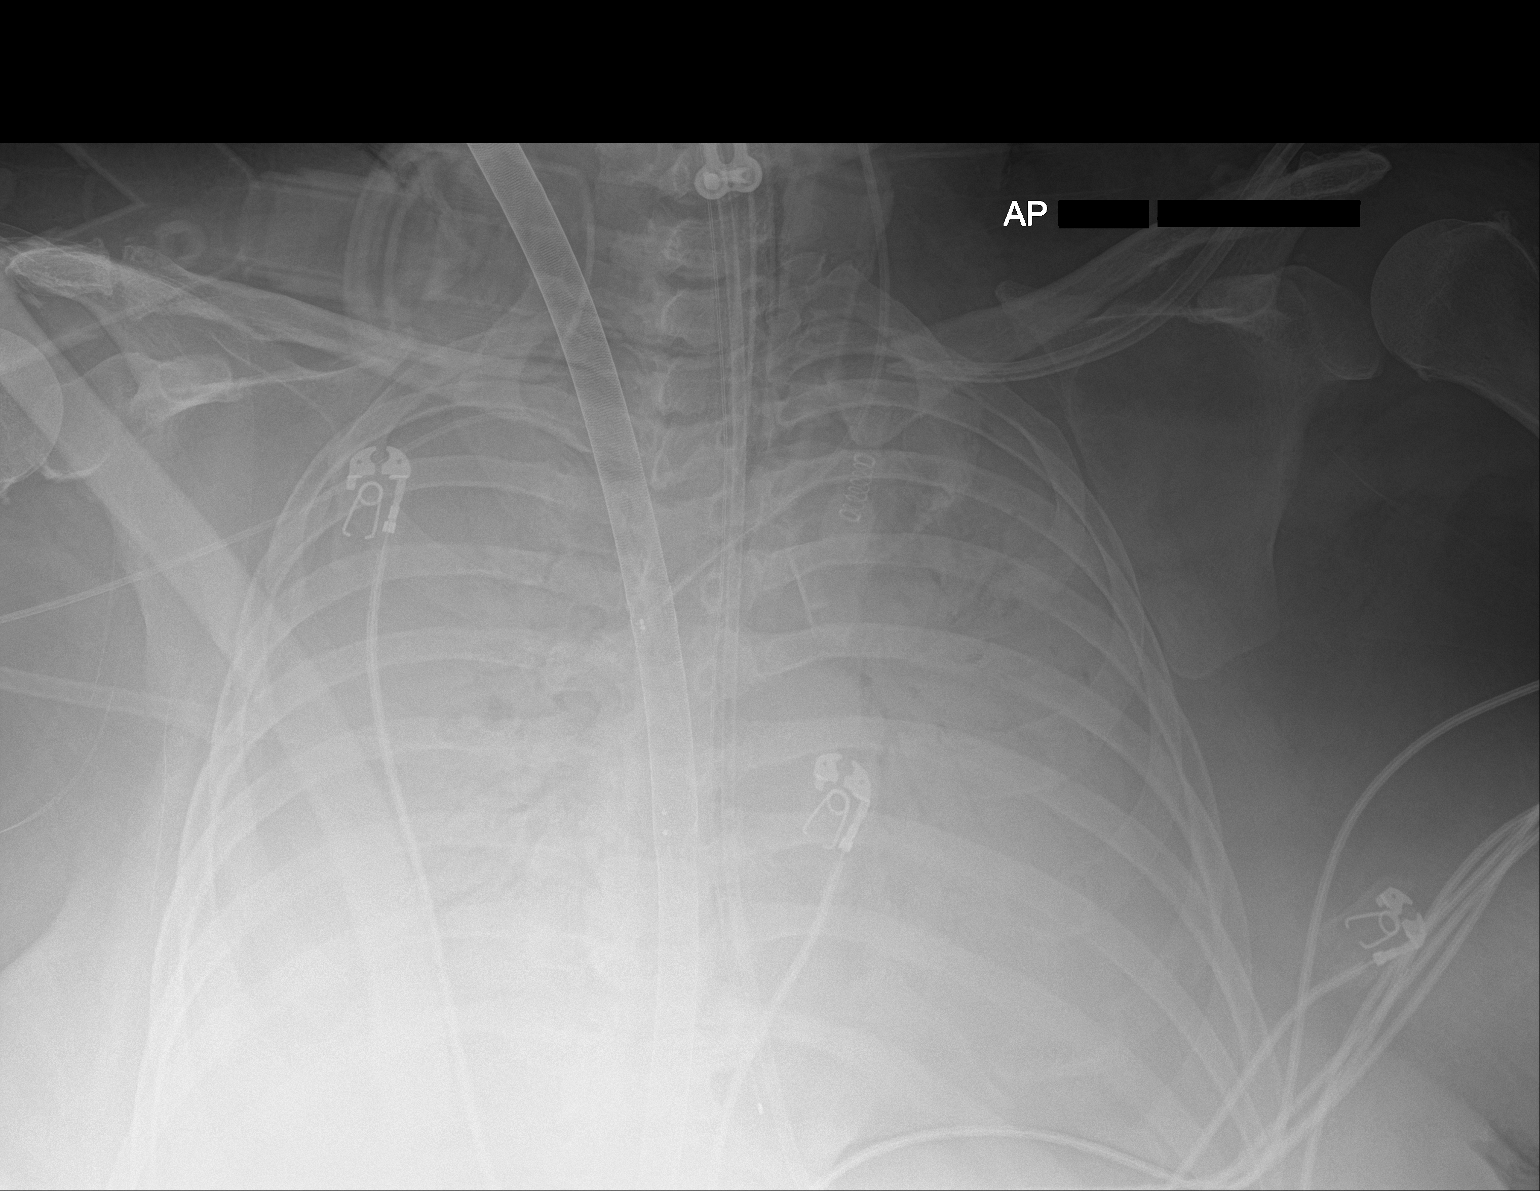

[1 of 1 positions shown; findings below may reference images not displayed]

FINDINGS: An endotracheal tube is in place with tip 4.8 cm above the carina.
Right upper extremity PICC with tip terminating in the mid to distal
superior vena cava. There is a left-sided internal jugular central
venous catheter with tip terminating in the mid superior vena cava.
A feeding tube is seen extending into the abdomen, however, the tip
of the feeding tube extends below the lower margin of the image.
Esophageal thermistor noted. ECMO cannula noted. Complete
opacification in the lungs bilaterally with multiple air
bronchograms. Orthopedic fixation hardware in the lower cervical
spine incidentally noted.
IMPRESSION: 1. Radiographic appearance the chest is very similar to the prior
study, as above.

## 2021-09-28 IMAGING — DX DG CHEST 1V PORT
2 series · 2 of 2 positions shown · non-contrast
Comparison: 02/05/2020.

CLINICAL DATA: Respiratory failure.  ECMO.  Recent COVID.

EXAM:
PORTABLE CHEST 1 VIEW

[chest ap (1 of 2)]
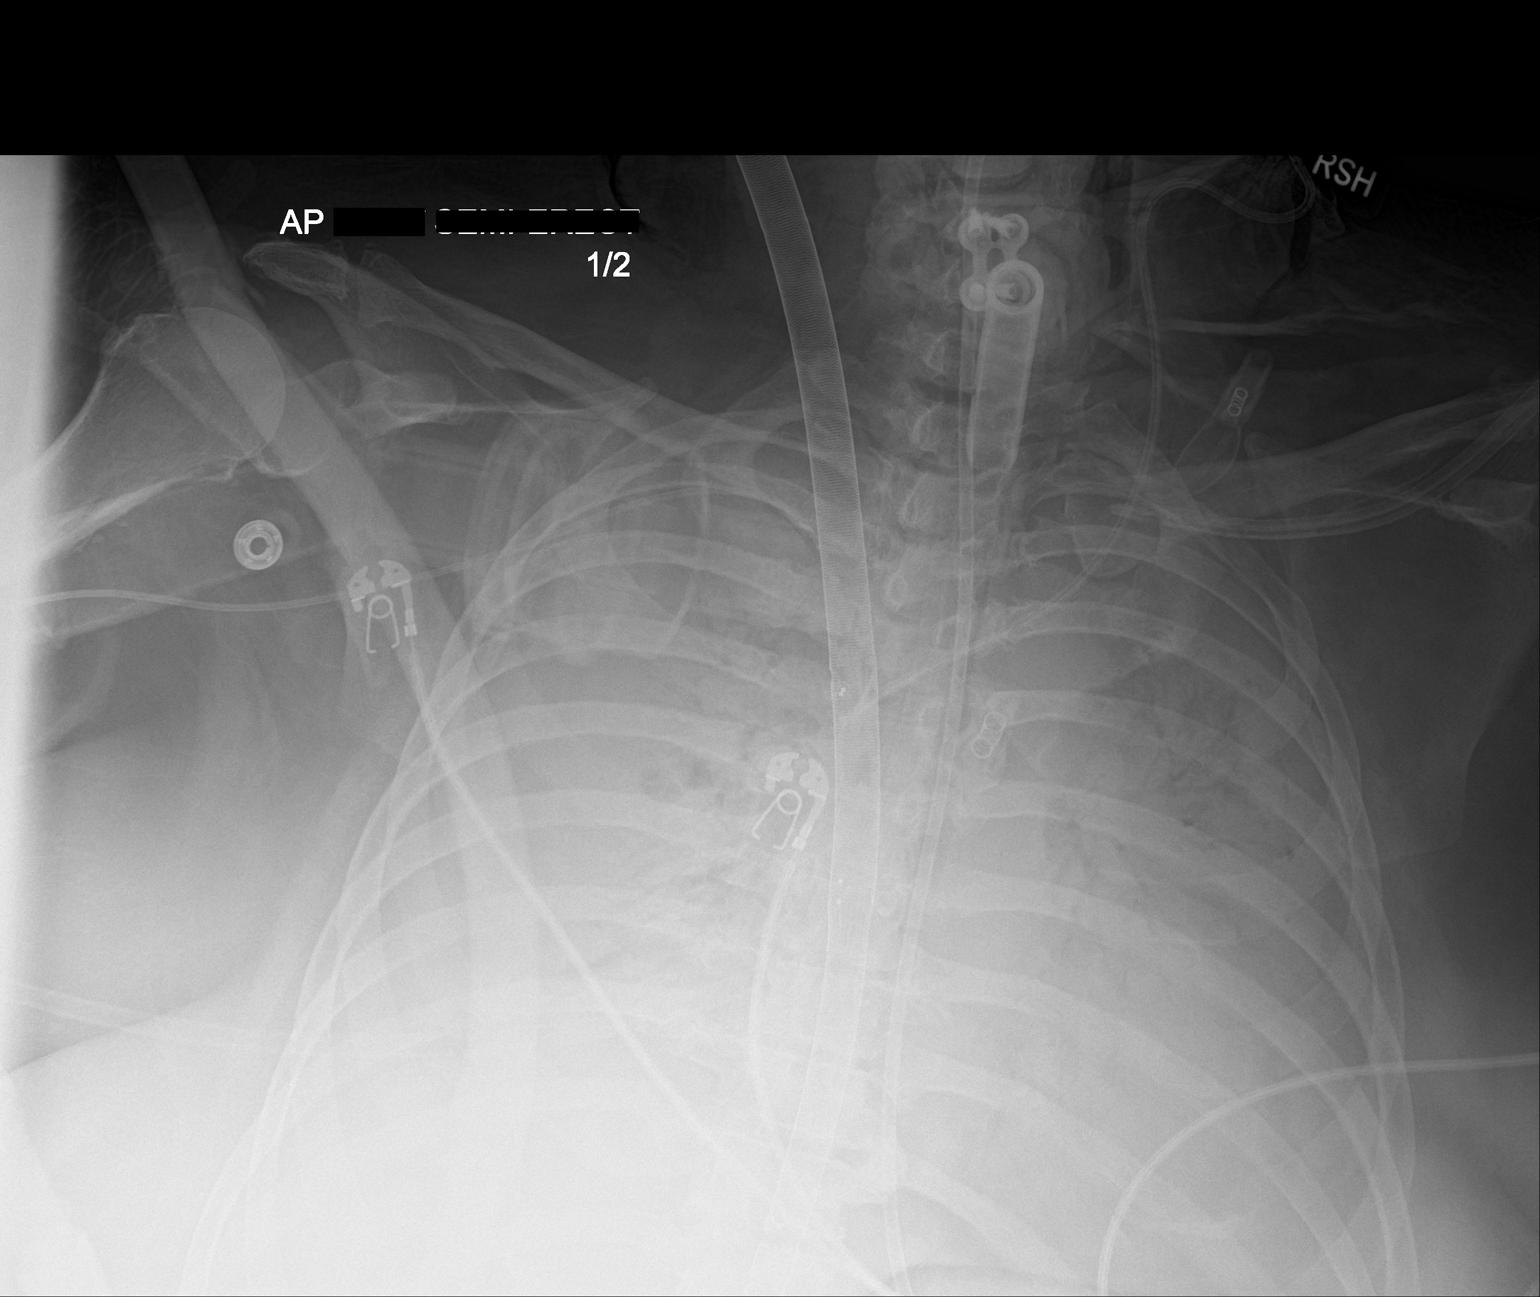

[chest ap (2 of 2)]
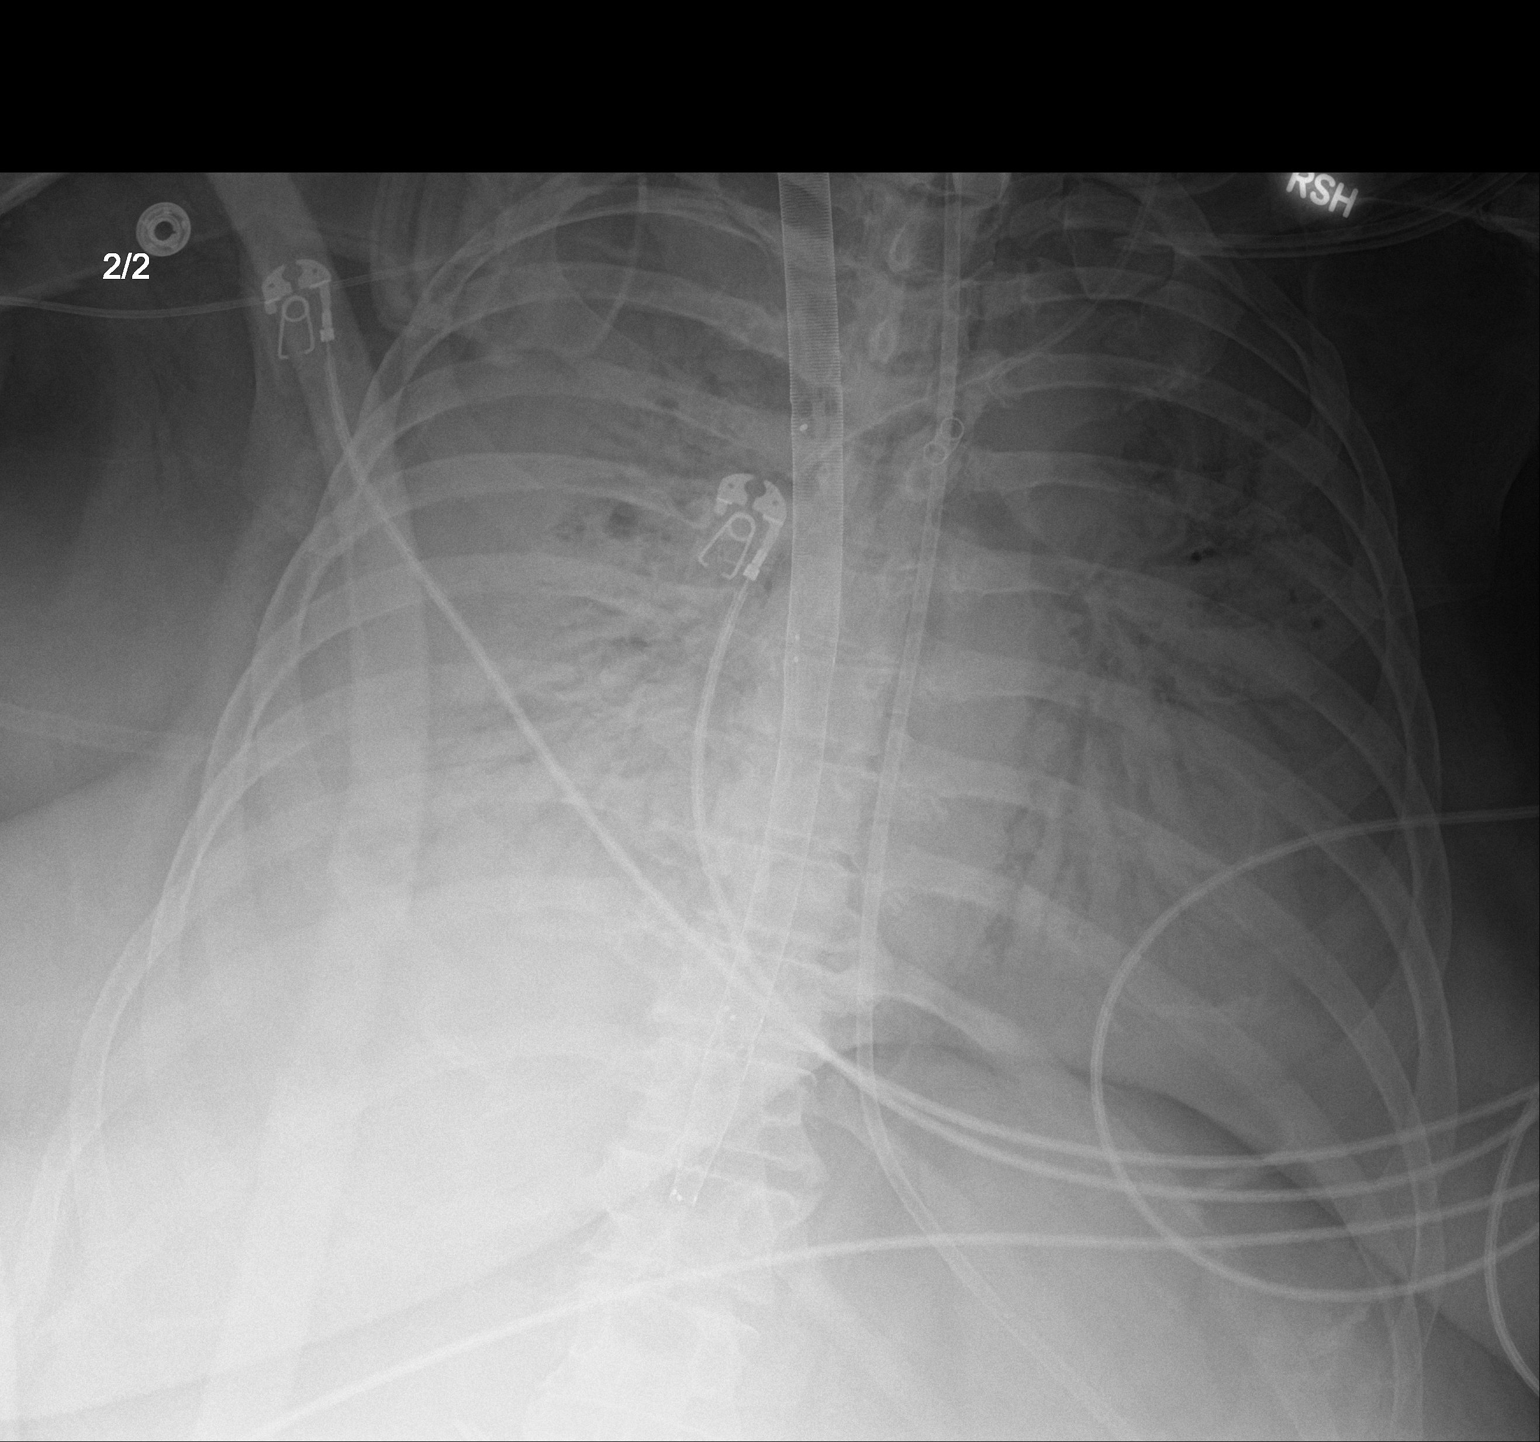

[2 of 2 positions shown; findings below may reference images not displayed]

FINDINGS: Tracheostomy tube, feeding tube, left IJ line, right PICC line, ECMO
device in stable position. Diffuse opacification of both hemi
thoraces again noted with slight aeration in both lungs noted on
today's exam. Heart size cannot be evaluated. Prior cervical spine
fusion.
IMPRESSION: 1.  Lines and tubes including ECMO device in stable position.

2. Diffuse opacification of both hemi thoraces again noted with
slight aeration of both lungs noted on today's exam.

## 2021-09-29 IMAGING — DX DG CHEST 1V PORT
1 series · 1 of 1 positions shown · non-contrast
Comparison: Multiple recent previous exams.

CLINICAL DATA: COVID pneumonia with respiratory failure.  ECMO.

EXAM:
PORTABLE CHEST 1 VIEW

[chest ap]
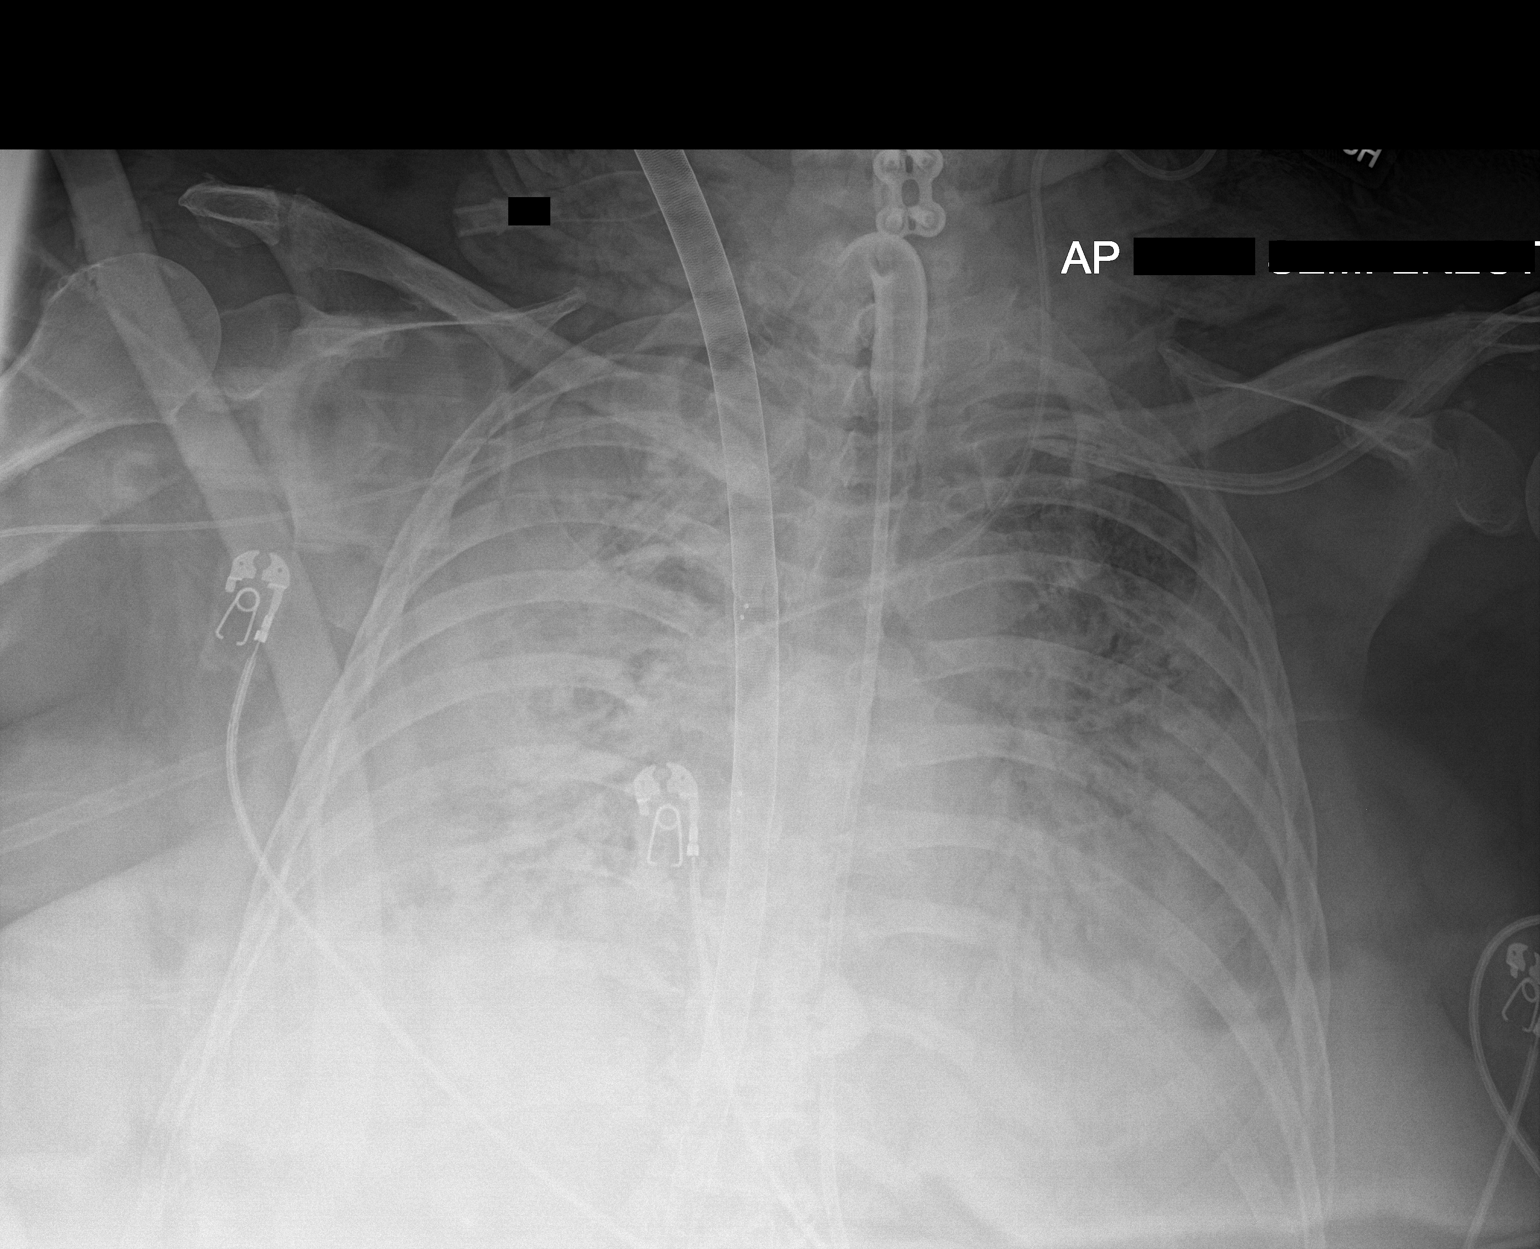

[1 of 1 positions shown; findings below may reference images not displayed]

FINDINGS: The diffuse confluent airspace opacity in both lungs is again noted
with some slight interval improvement at the left upper lung.
Tracheostomy tube remains in place. A feeding tube passes into the
stomach although the distal tip position is not included on the
film. Left IJ central line tip overlies the innominate vein
confluence. Left subclavian central line tip overlies the central
left subclavian vein. Right PICC line tip overlies the mid SVC
level. Telemetry leads overlie the chest. ECMO cannula again noted.
IMPRESSION: Slight interval improvement in the left upper lung. Otherwise no
substantial change in exam.

## 2021-10-01 IMAGING — DX DG ABD PORTABLE 1V
1 series · 1 of 1 positions shown · non-contrast
Comparison: 02/09/2020

CLINICAL DATA: Feeding tube placement.

EXAM:
PORTABLE ABDOMEN - 1 VIEW

[abdomen]
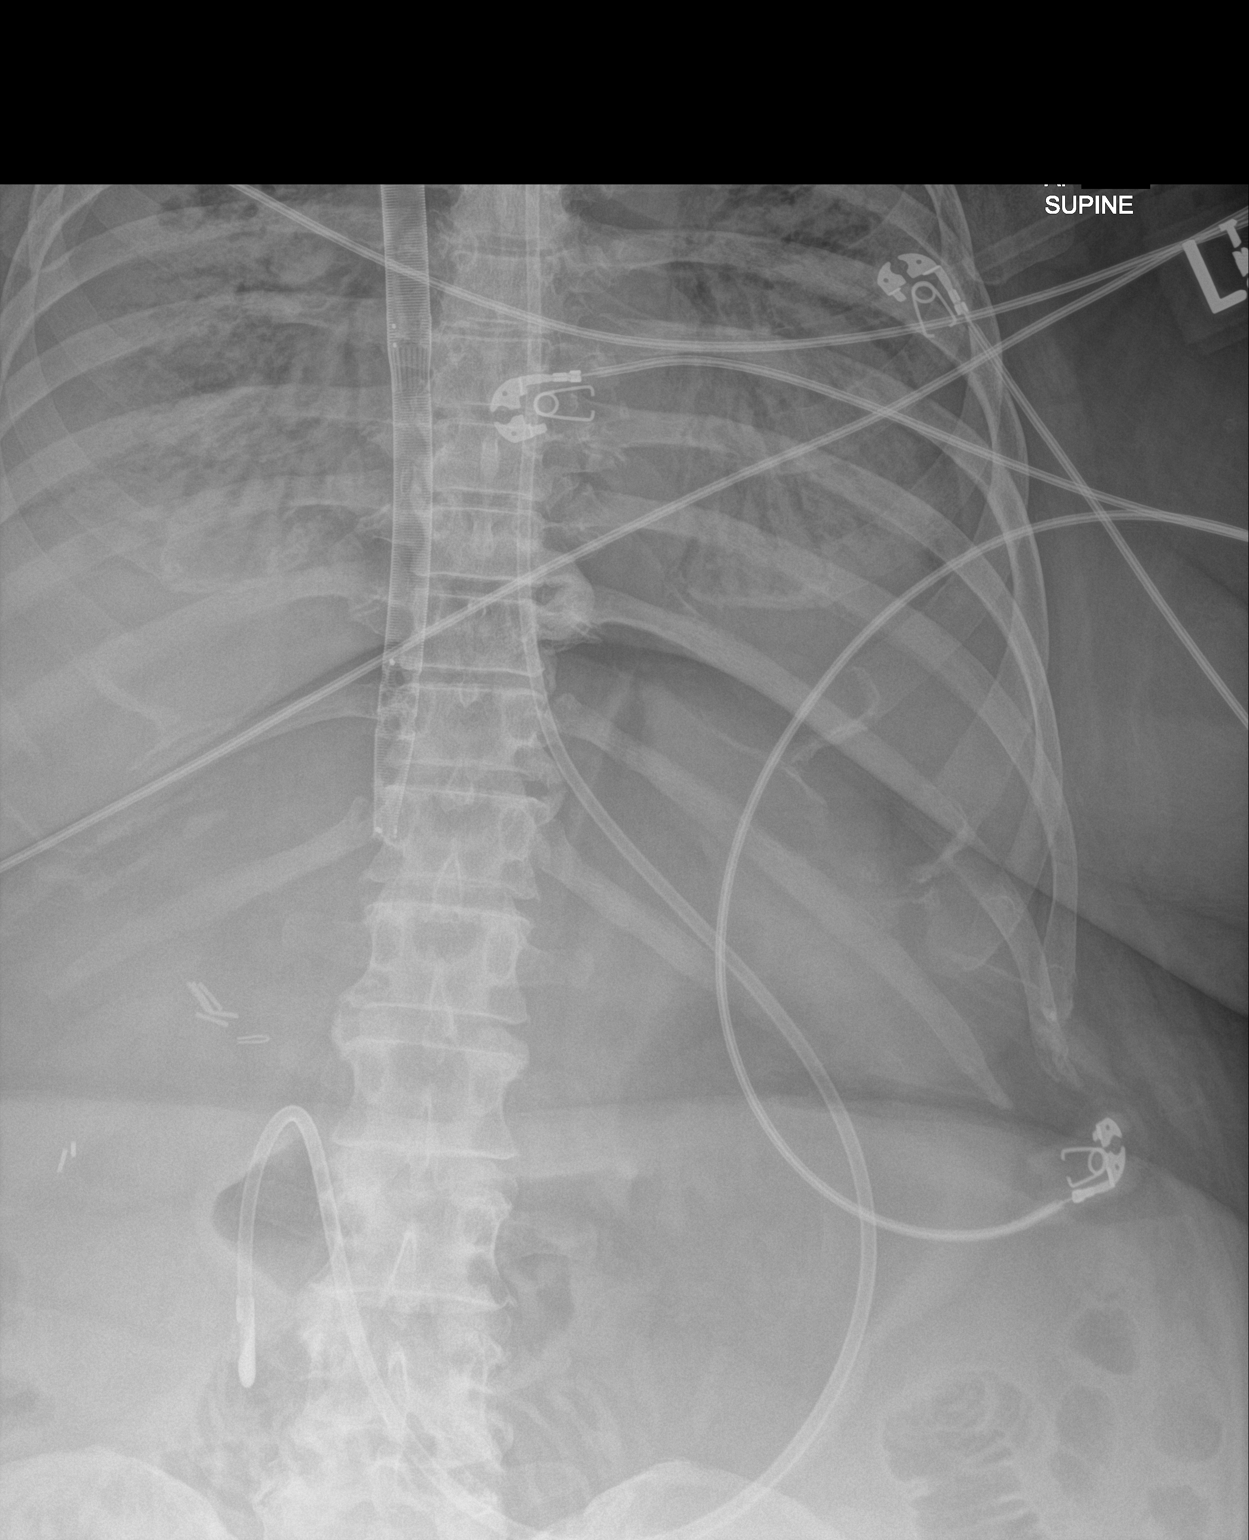

[1 of 1 positions shown; findings below may reference images not displayed]

FINDINGS: Feeding tube is been advanced into the duodenum with the tip in the
second portion of duodenum.

Central venous large-bore catheter extending into the distal IVC
unchanged. Severe airspace disease bilaterally. No dilated bowel
loops.
IMPRESSION: Feeding tube has been advanced with the tip in the second portion
the duodenum.

## 2021-10-01 IMAGING — DX DG CHEST 1V PORT
1 series · 1 of 1 positions shown · non-contrast
Comparison: Multiple recent previous exams.

CLINICAL DATA: COVID positive.  Respiratory failure.  ECMO.

EXAM:
PORTABLE CHEST 1 VIEW

[chest ap]
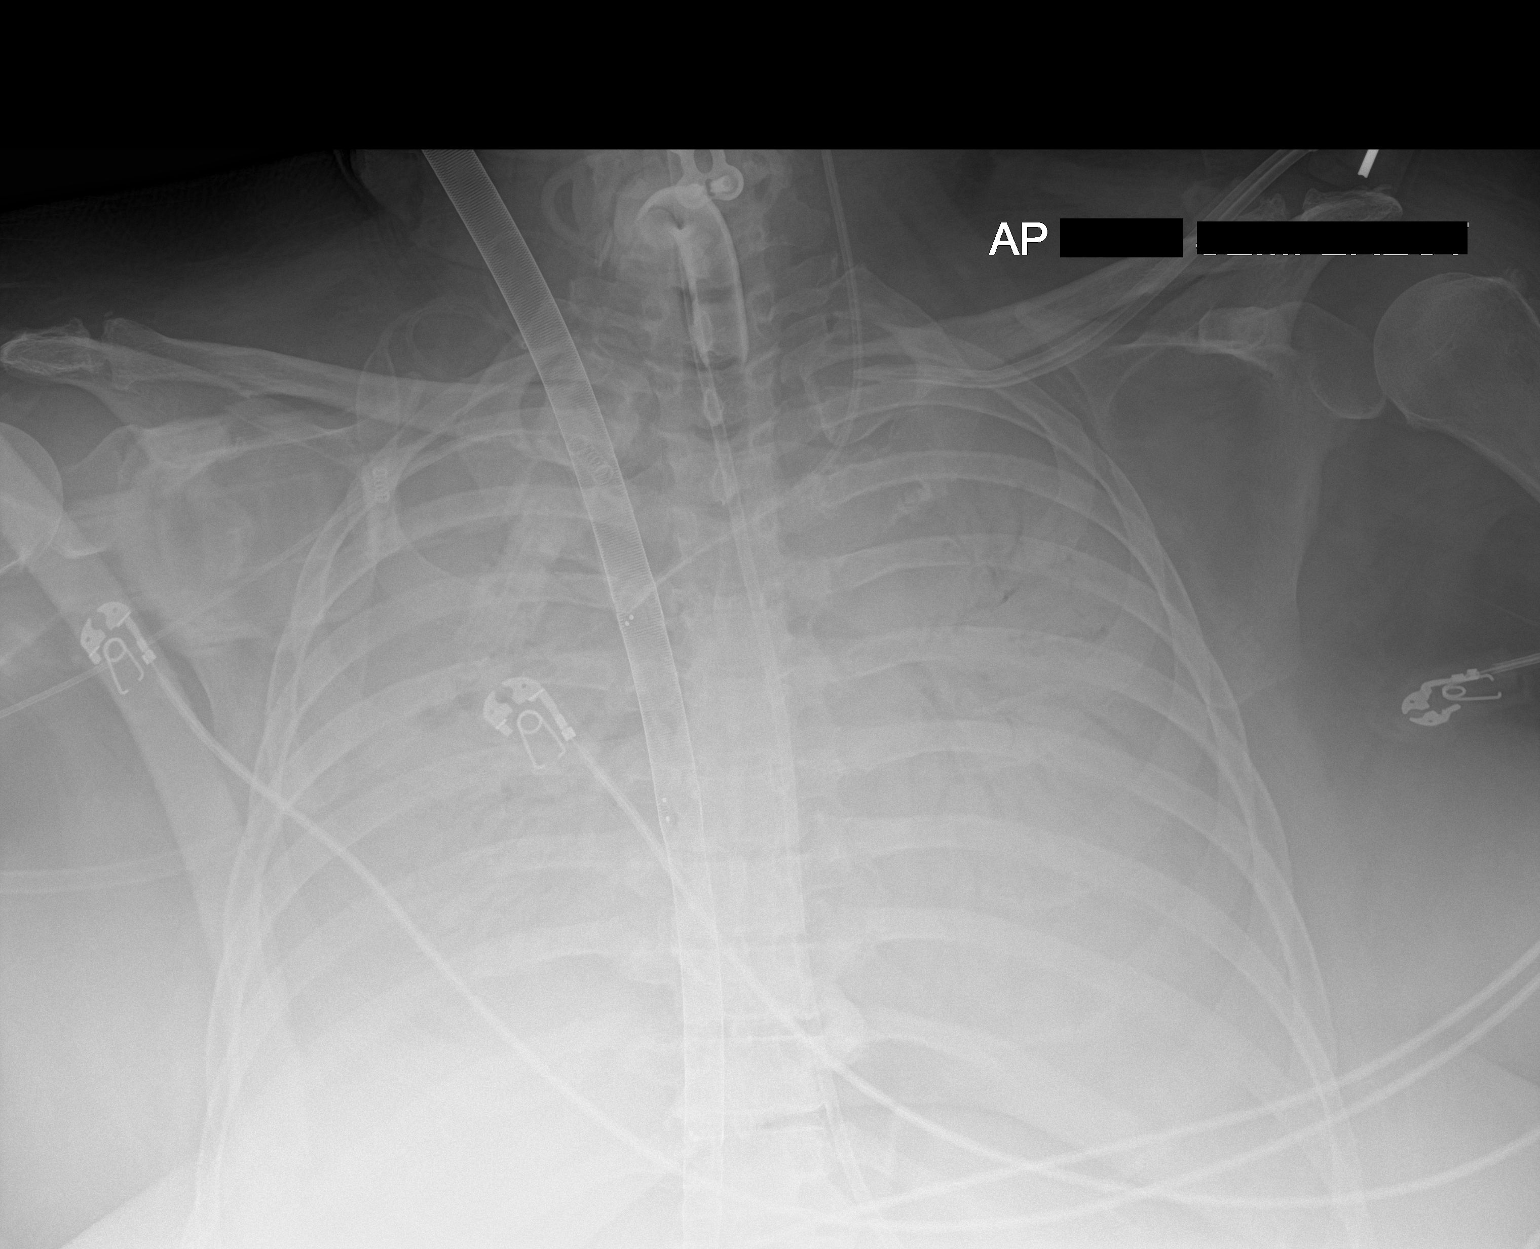

[1 of 1 positions shown; findings below may reference images not displayed]

FINDINGS: Confluent bilateral airspace opacity is again noted and appears
progressive in left upper lobe. Tracheostomy tube again noted in
situ. Right PICC line tip is difficult to visualize but is probably
in the right subclavian vein but may be obscured by the ECMO
catheter. Left IJ central line tip is positioned over the innominate
vein confluence. There is a multi lumen left subclavian catheter
with the tip overlying the medial aspect of the left subclavian
vein.
IMPRESSION: 1. Persistent diffuse bilateral airspace disease with interval
progression in the left upper lobe.
2. Stable position of support tubing.

## 2021-10-01 IMAGING — DX DG ABDOMEN 1V
1 series · 1 of 1 positions shown · non-contrast
Comparison: Chest 02/09/2020

CLINICAL DATA: NG placement

EXAM:
ABDOMEN - 1 VIEW

[abdomen kub]
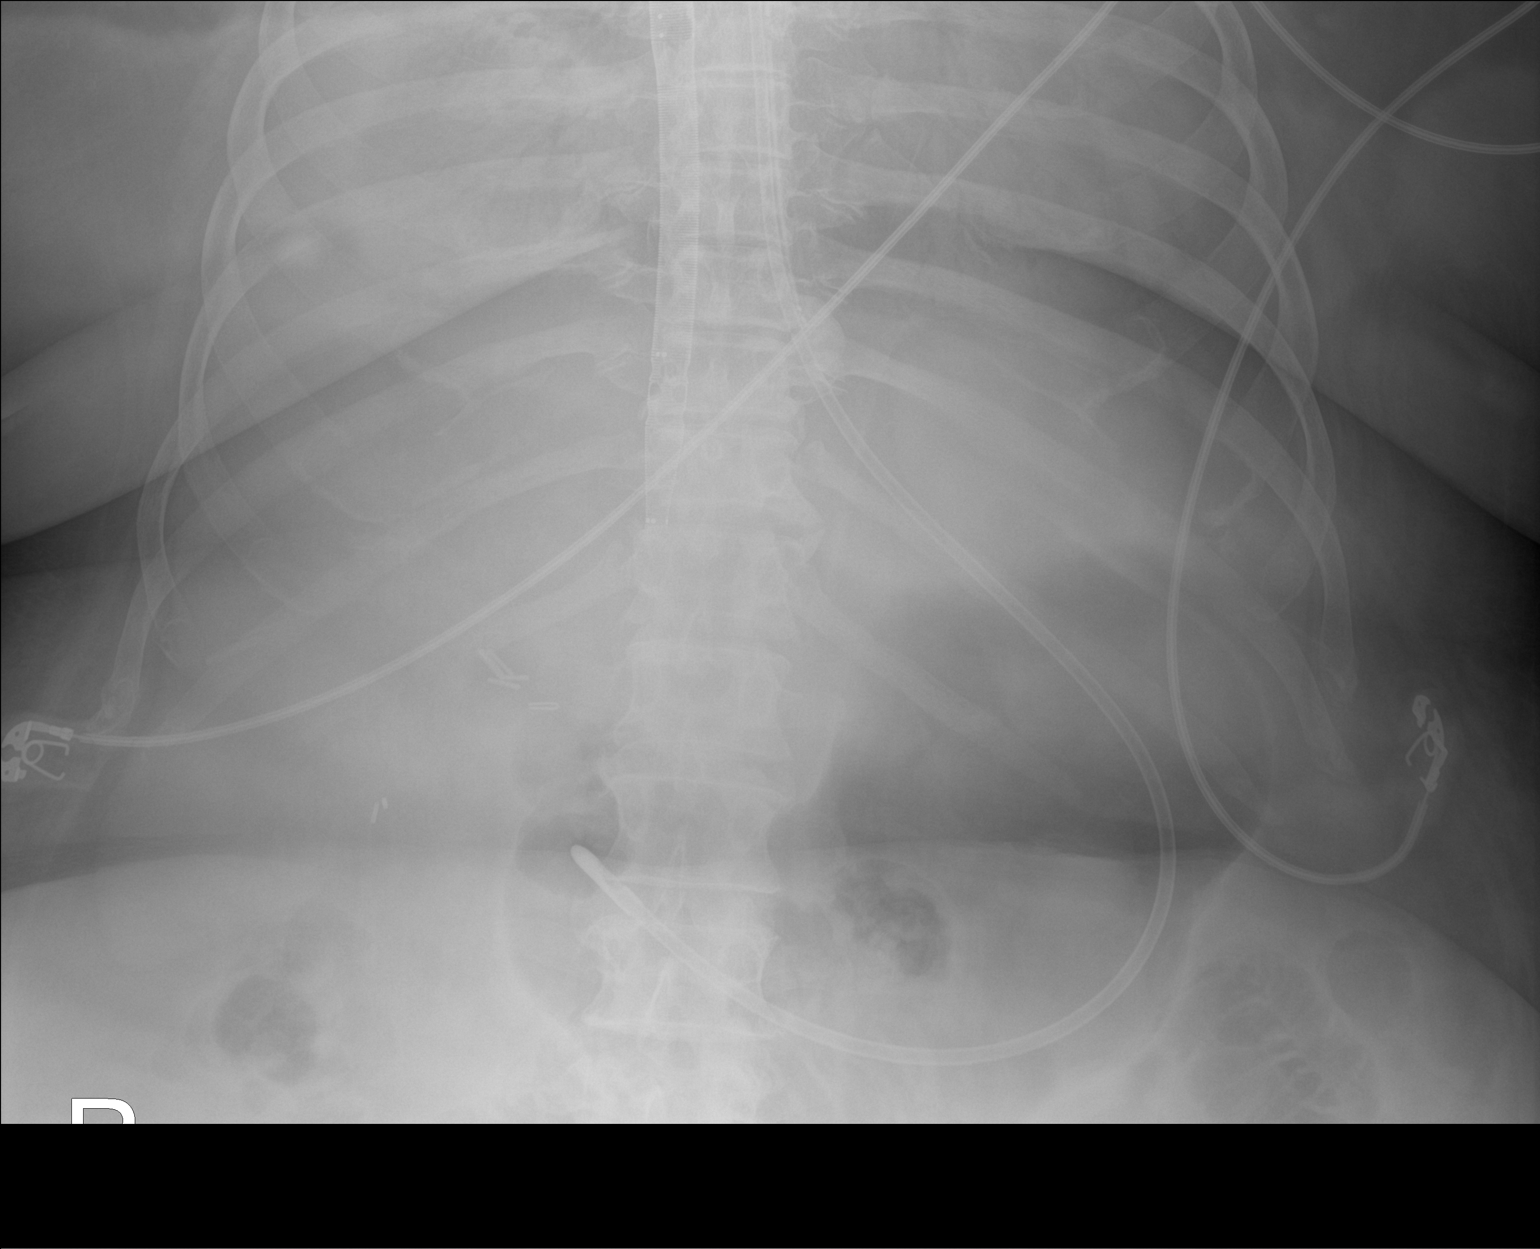

[1 of 1 positions shown; findings below may reference images not displayed]

FINDINGS: Feeding tube enters the stomach with the tip in the region of the
antrum or duodenal bulb. Normal bowel gas pattern

Large bore vascular catheter in the right atrium/distal IVC
unchanged from chest x-ray.
IMPRESSION: Feeding tube tip in the region the gastric antrum or duodenal bulb.
Normal bowel gas pattern.

## 2021-10-02 IMAGING — DX DG CHEST 1V PORT
1 series · 1 of 1 positions shown · non-contrast
Comparison: Yesterday

CLINICAL DATA: ECMO

EXAM:
PORTABLE CHEST 1 VIEW

[chest]
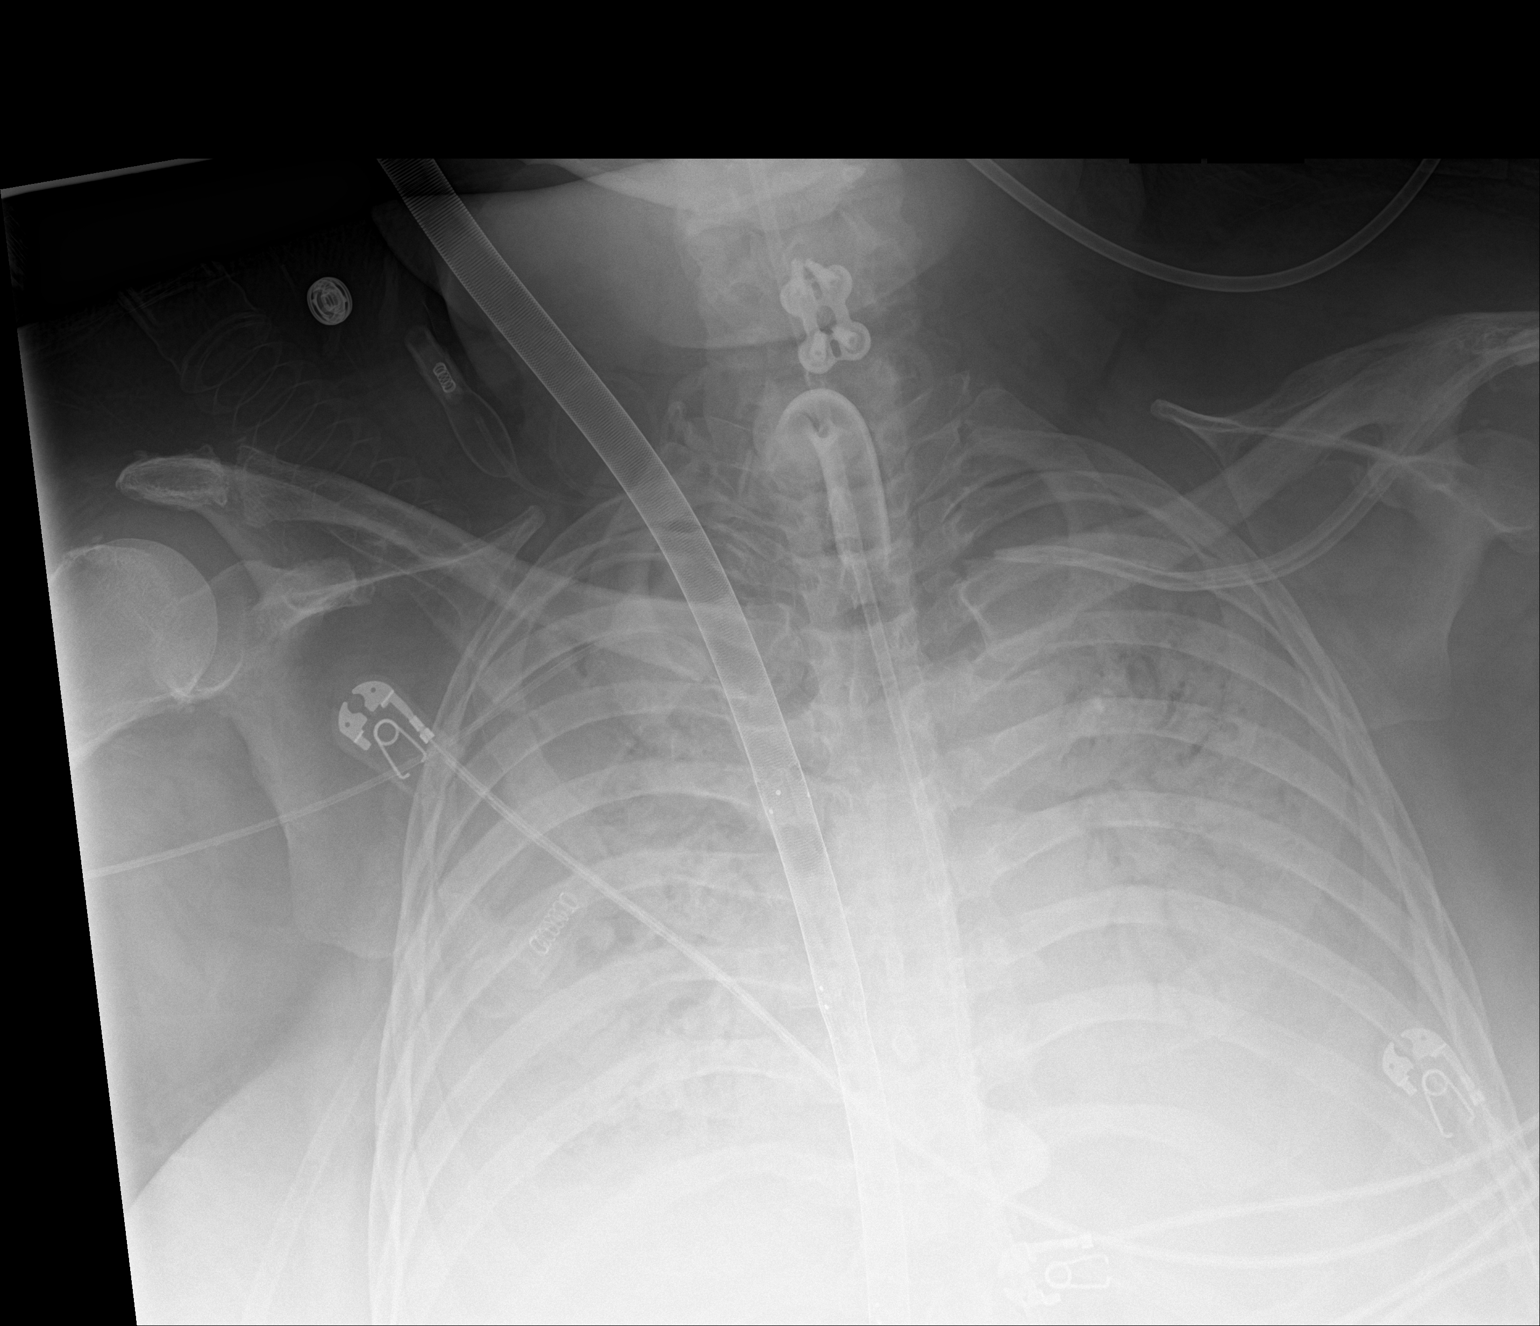

[1 of 1 positions shown; findings below may reference images not displayed]

FINDINGS: Tracheostomy tube in place. Left subclavian line with tip at the
subclavian level. Feeding tube at least reaches the diaphragm. ECMO
catheter in similar position. The ECMO catheter obscures the tip of
the right PICC. Airless lungs. Obscured heart size. No visible air
leak.
IMPRESSION: 1. Stable hardware positioning.
2. Airless lungs.

## 2021-10-03 IMAGING — DX DG CHEST 1V PORT
1 series · 1 of 1 positions shown · non-contrast
Comparison: 02/10/2020

CLINICAL DATA: ECMO

EXAM:
PORTABLE CHEST 1 VIEW

[chest ap]
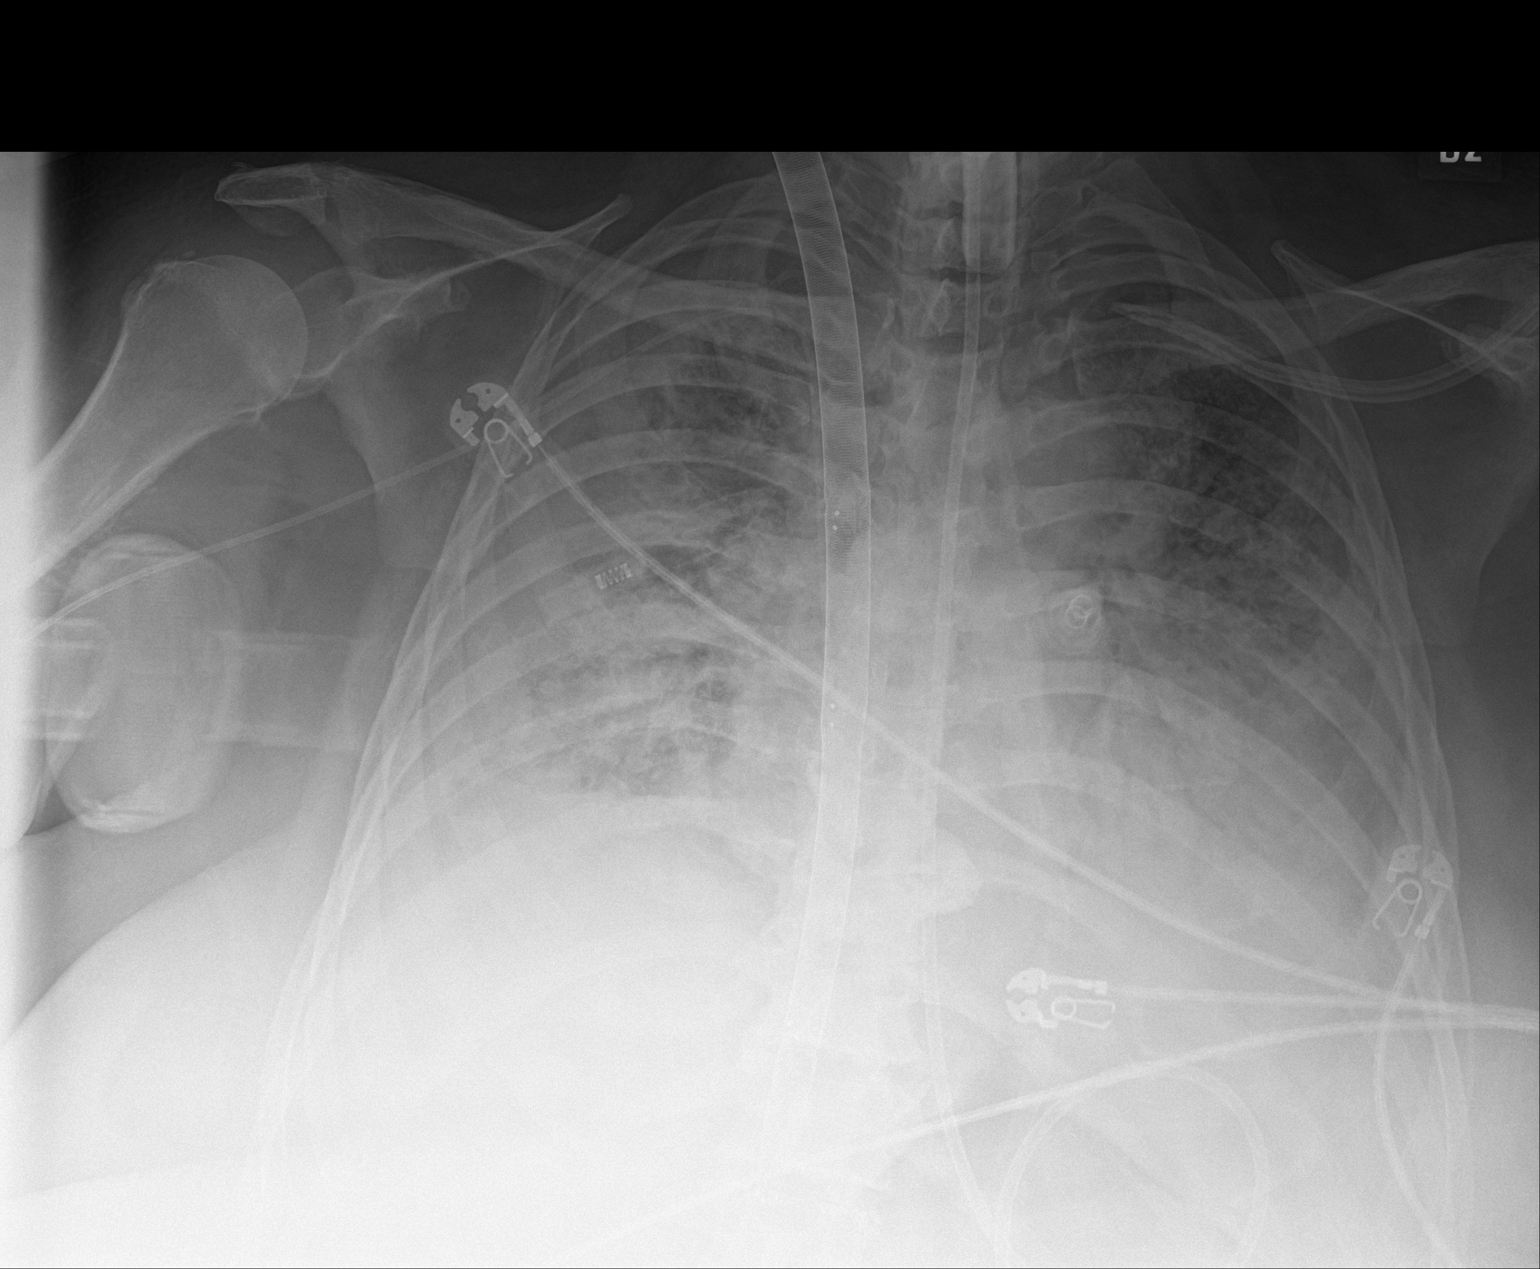

[1 of 1 positions shown; findings below may reference images not displayed]

FINDINGS: Tracheostomy in satisfactory position.  ECMO catheter.

Enteric tube courses into the stomach. Right arm PICC is obscured
but likely terminates at the cavoatrial junction. Left subclavian
catheter terminates at the level of the clavicle.

Multifocal patchy opacities with slightly improved aeration of the
lungs bilaterally. No pneumothorax.

The cardiomediastinal silhouette is largely obscured.

Cervical spine fixation hardware, incompletely visualized.
IMPRESSION: Stable support apparatus in this patient on ECMO.

Multifocal patchy opacities with slightly improved aeration of the
lungs bilaterally.

## 2021-10-03 IMAGING — DX DG ABD PORTABLE 1V
1 series · 2 of 2 positions shown · non-contrast
Comparison: 02/09/2020; 01/30/2020; chest radiograph-02/11/2020;
02/10/2020

CLINICAL DATA: Emesis. [id number] infection. Patient is currently on
ECMO.

EXAM:
PORTABLE ABDOMEN - 1 VIEW

[Series 1: abdomen · 0.14mm/px · 2 of 2 slices shown]
[im 1/2]
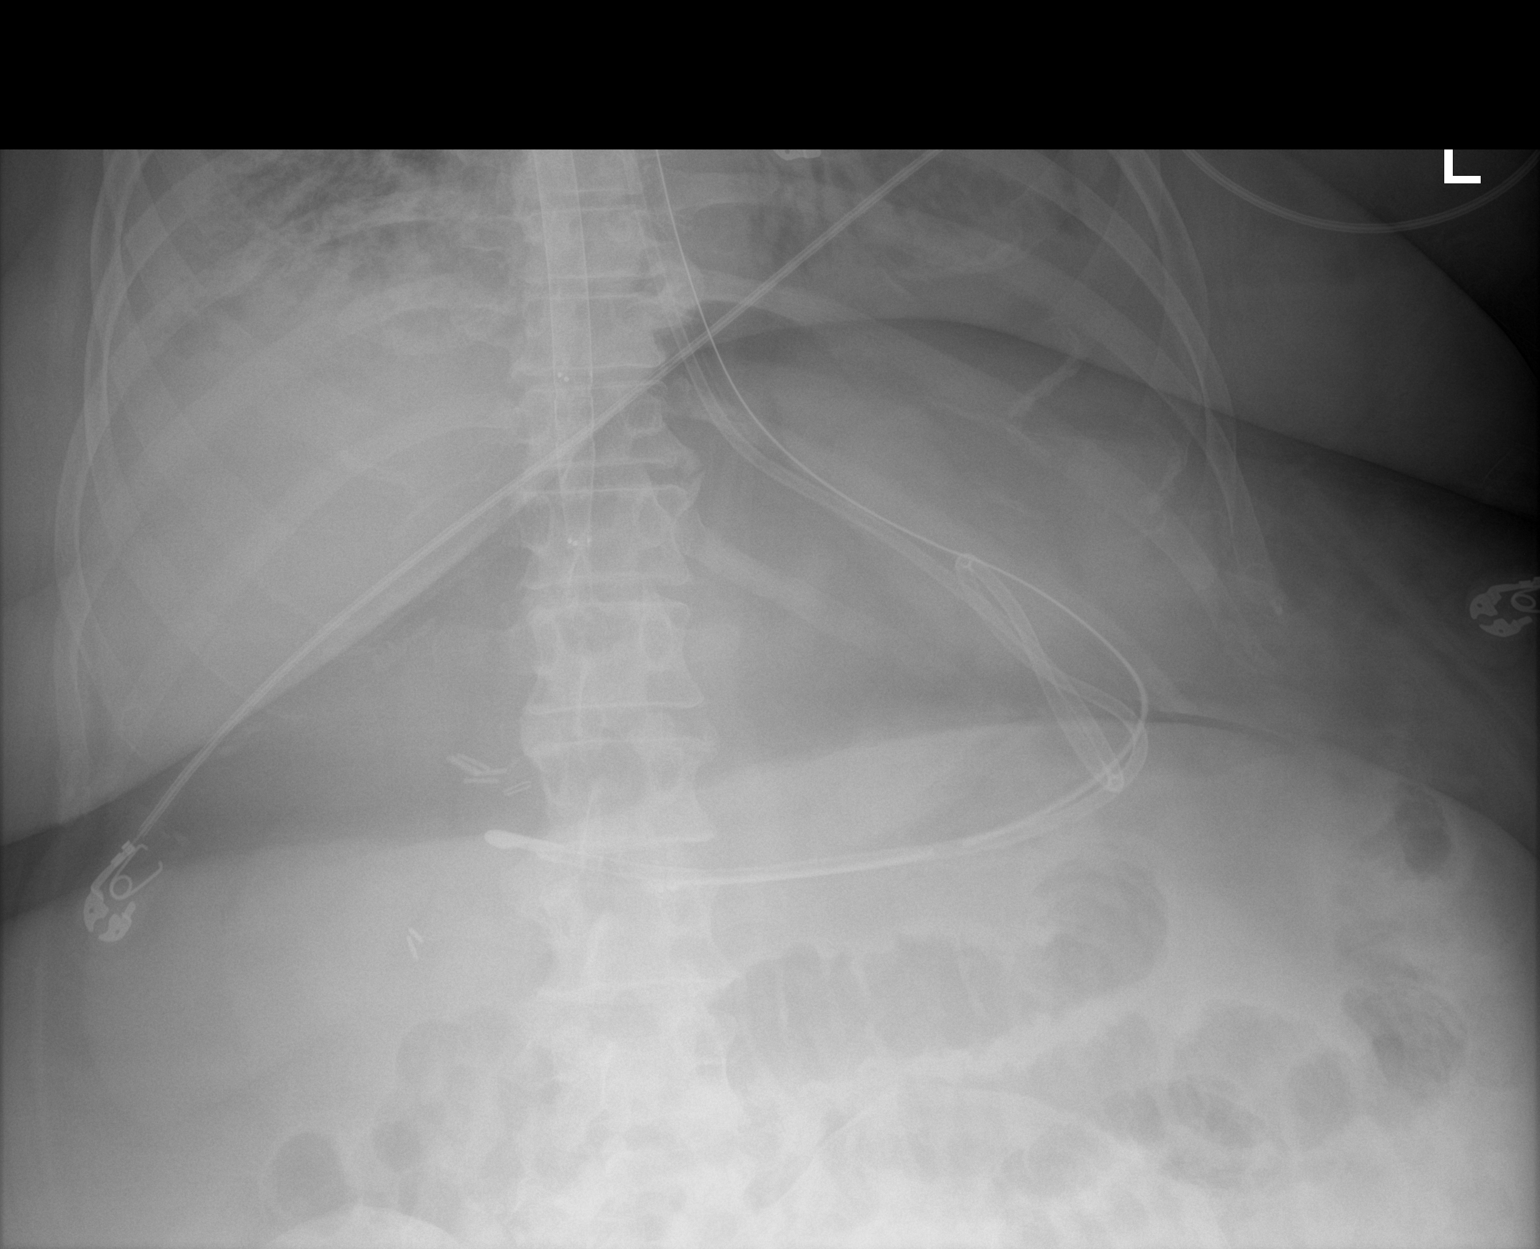
[im 2/2]
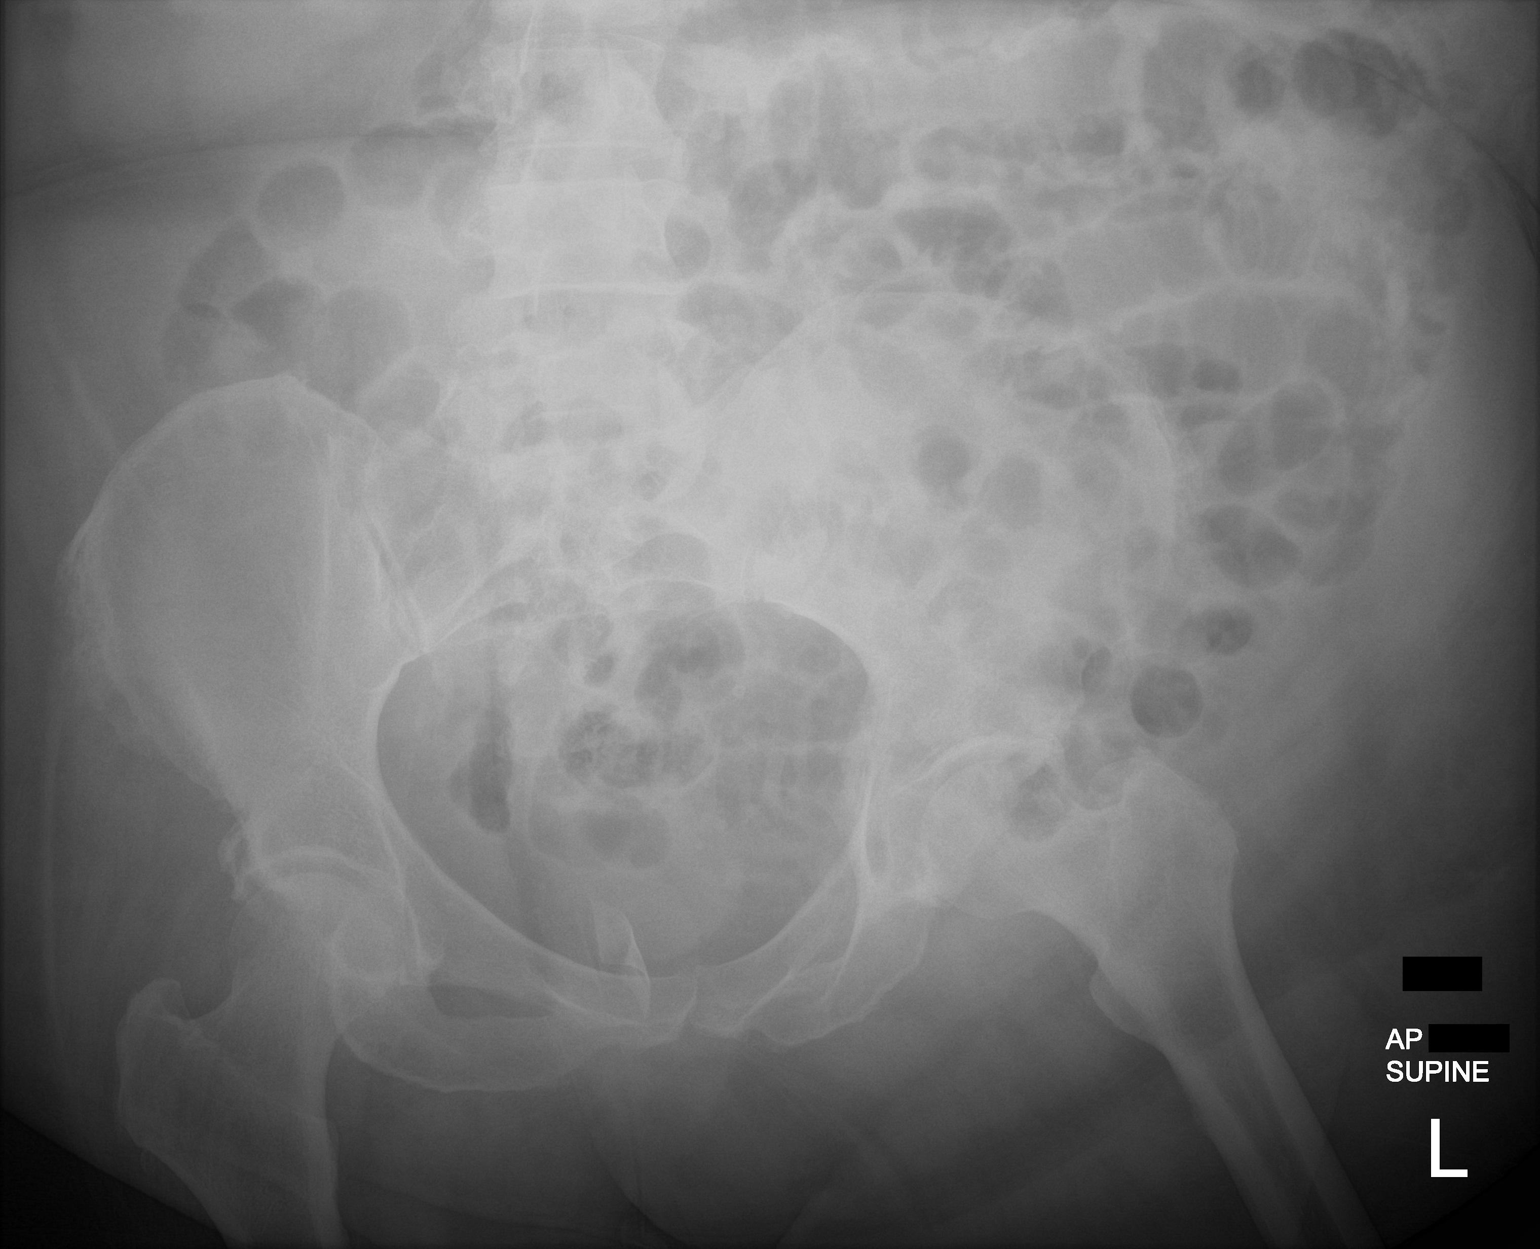

[2 of 2 positions shown; findings below may reference images not displayed]

FINDINGS: Redemonstrated mild gaseous distension of multiple loops of large
and small bowel.

There are two mildly dilated loops of small bowel within the midline
of the upper abdomen which demonstrate potential minimal amount of
bowel wall thickening.

No pneumoperitoneum, pneumatosis or portal venous gas.

Limited visualization of lower thorax demonstrates extensive
bilateral consolidative opacities. Enteric tube tips project over
the gastric antrum. ECMO cannulation device tip is unchanged in
positioning overlying the T12 vertebral body.

Post cholecystectomy.

Radiopaque structure overlying the lower pelvis is likely external
to the patient. No acute osseous abnormalities. Degenerative change
of the bilateral hips.
IMPRESSION: Similar findings most suggestive of ileus though there are 2 loops
of small bowel which demonstrate potential bowel wall thickening as
could be seen in the setting of an enteritis either of infectious,
inflammatory or ischemic etiology. No pneumoperitoneum pneumatosis.

## 2021-10-05 IMAGING — DX DG CHEST 1V PORT
1 series · 1 of 1 positions shown · non-contrast
Comparison: 02/12/2020

CLINICAL DATA: Respiratory failure.  Recent COVID.

EXAM:
PORTABLE CHEST 1 VIEW

[chest ap]
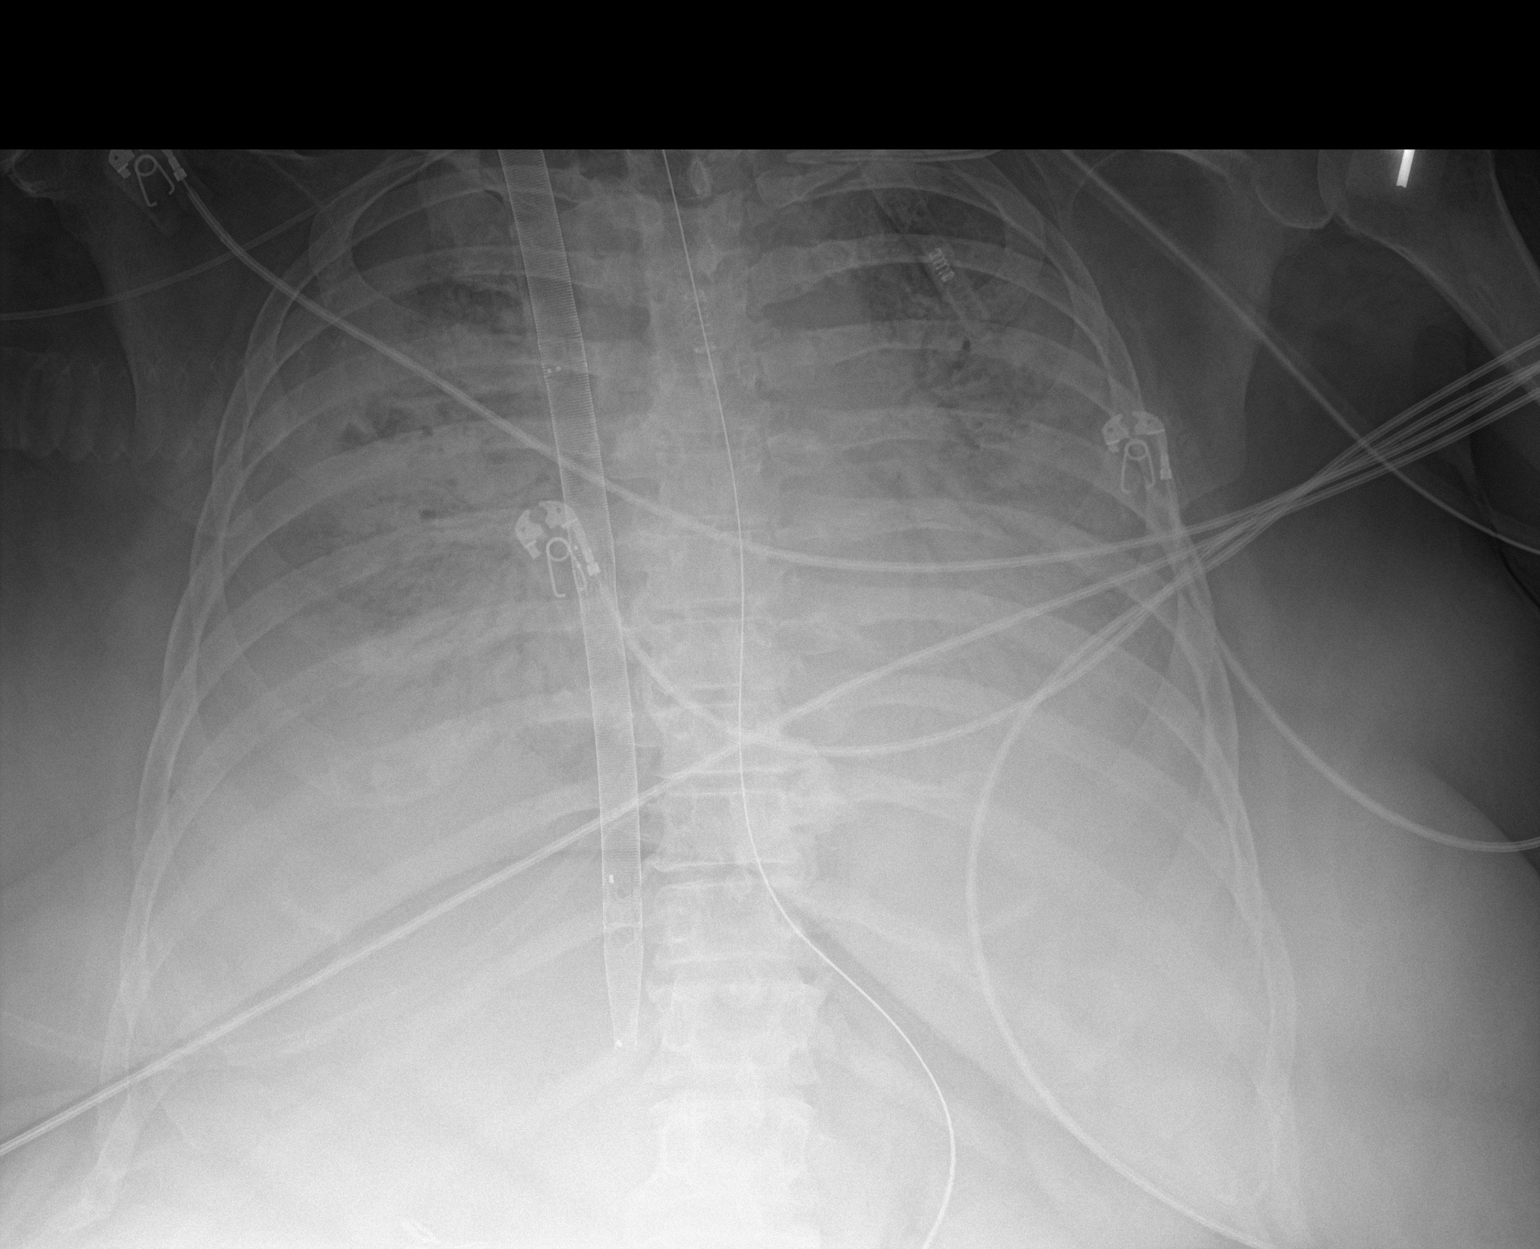

[1 of 1 positions shown; findings below may reference images not displayed]

FINDINGS: Tracheostomy, enteric tube, and central lines are unchanged in
position. Diffuse airspace consolidation in both lungs without
change. Heart size is obscured.
IMPRESSION: Diffuse airspace consolidation in both lungs without change.

## 2021-10-06 IMAGING — DX DG CHEST 1V PORT
1 series · 1 of 1 positions shown · non-contrast
Comparison: Yesterday

CLINICAL DATA: ECMO

EXAM:
PORTABLE CHEST 1 VIEW

[chest ap]
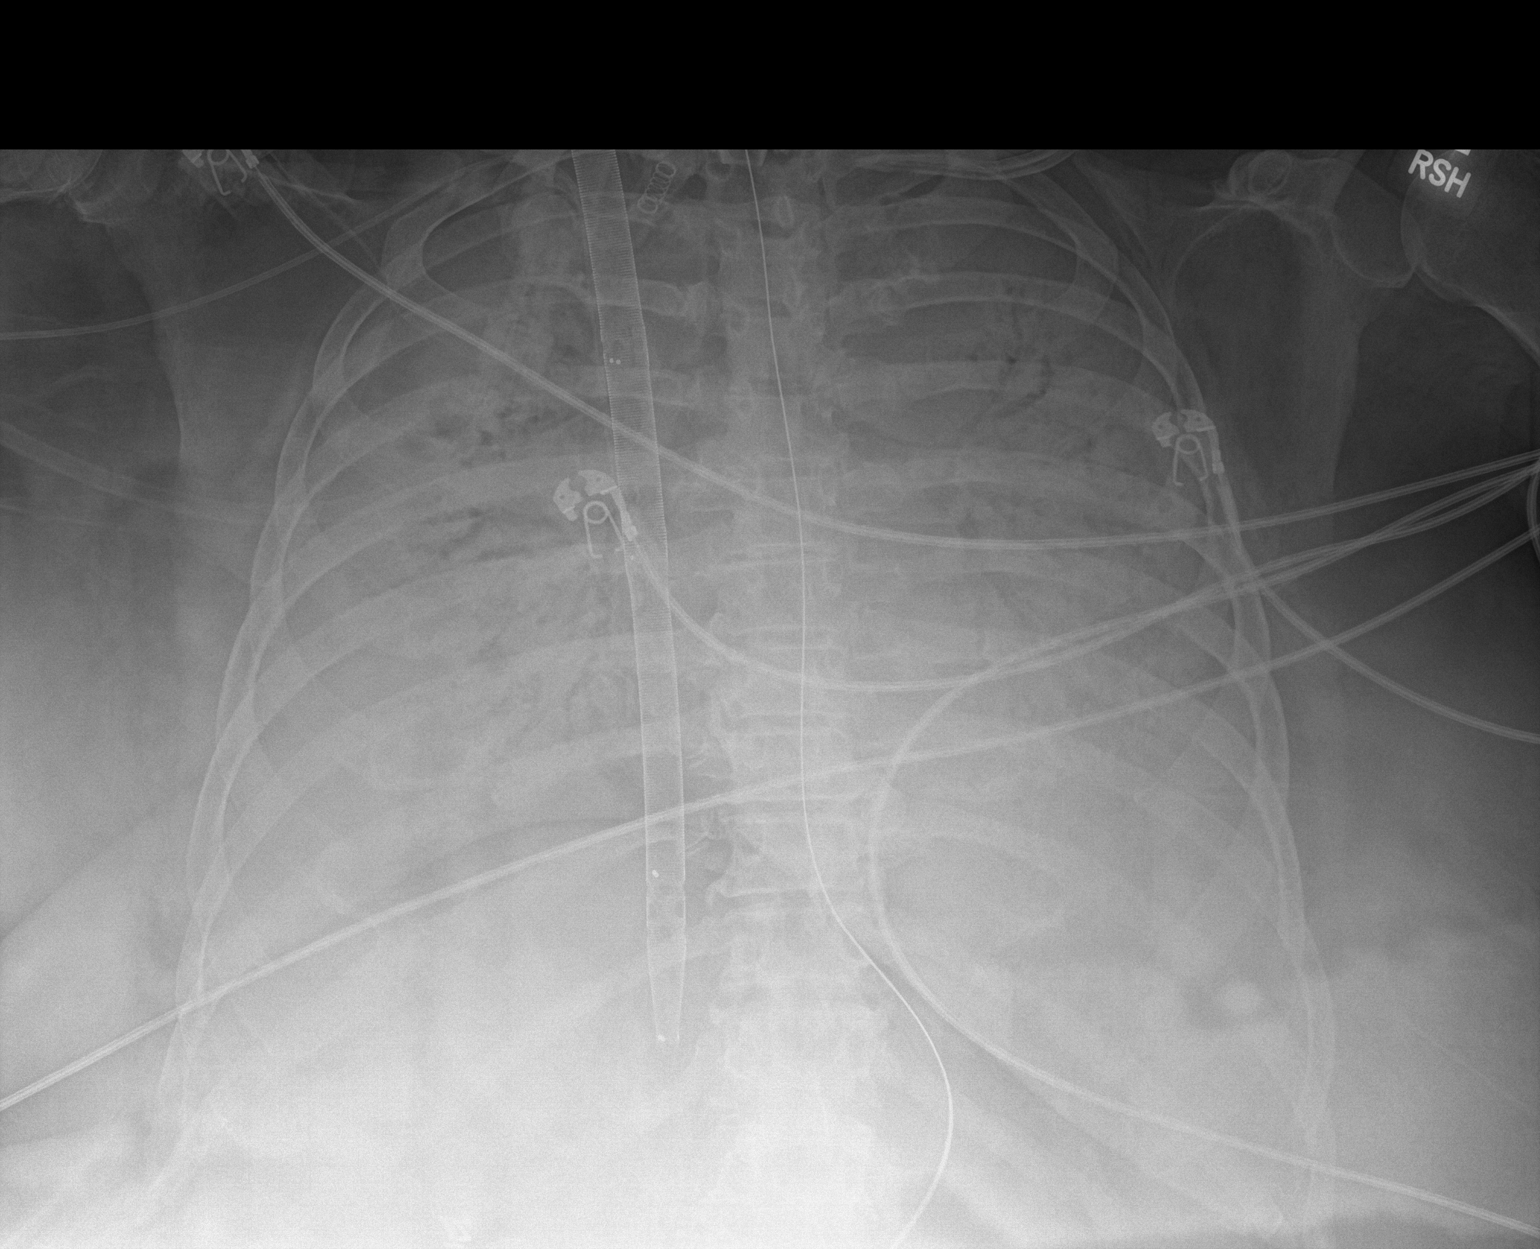

[1 of 1 positions shown; findings below may reference images not displayed]

FINDINGS: Endotracheal tube tip is at the clavicular heads. Bilateral central
line in unchanged position. ECMO catheter in stable position. The
enteric tube at least reaches the mid stomach. Airless lungs and
obscured heart size. No visible air leak.
IMPRESSION: 1. Stable hardware positioning.
2. Airless lungs.

## 2021-10-07 IMAGING — DX DG CHEST 1V PORT
1 series · 1 of 1 positions shown · non-contrast
Comparison: Radiograph earlier this day.

CLINICAL DATA: Encounter for hemodialysis.  Respiratory distress.

EXAM:
PORTABLE CHEST 1 VIEW

[chest ap]
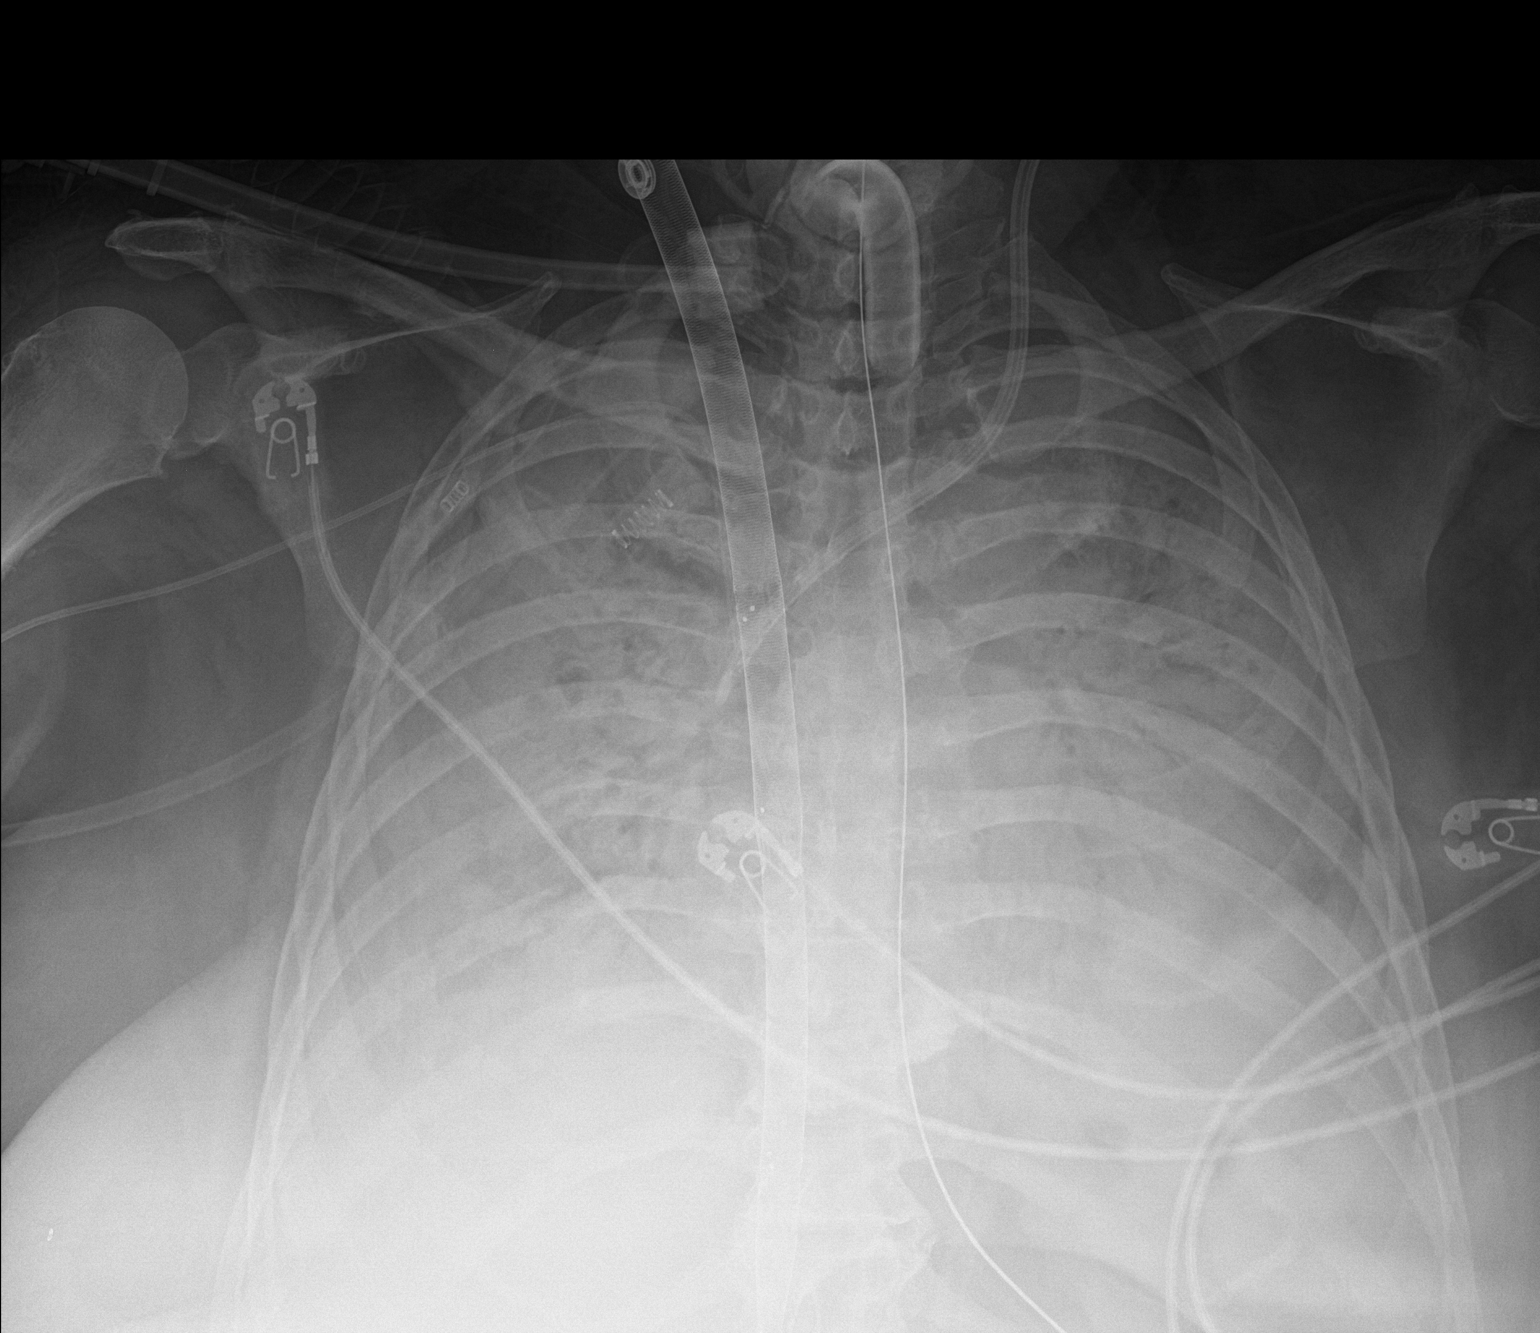

[1 of 1 positions shown; findings below may reference images not displayed]

FINDINGS: New left internal jugular dialysis catheter tip in the upper SVC. No
visualized pneumothorax. Right internal jugular ECMO catheter tip in
the upper abdomen not entirely included in the field of view.
Tracheostomy tube tip at the thoracic inlet. Enteric tube remains in
place. Near complete opacification throughout both hemi thoraces
with slight improved aeration from prior exam and increasing
ventilation in the perihilar lungs.
IMPRESSION: 1. New left internal jugular dialysis catheter tip in the upper SVC.
No visualized pneumothorax.
2. Additional support apparatus unchanged.
3. Near complete opacification throughout both lungs with slight
improved aeration from prior exam and increasing ventilation in the
perihilar lungs.

## 2021-10-07 IMAGING — DX DG CHEST 1V PORT
1 series · 1 of 1 positions shown · non-contrast
Comparison: 02/14/2020.

CLINICAL DATA: Tracheostomy. ECMO. Respiratory failure. Recent
COVID.

EXAM:
PORTABLE CHEST 1 VIEW

[chest ap]
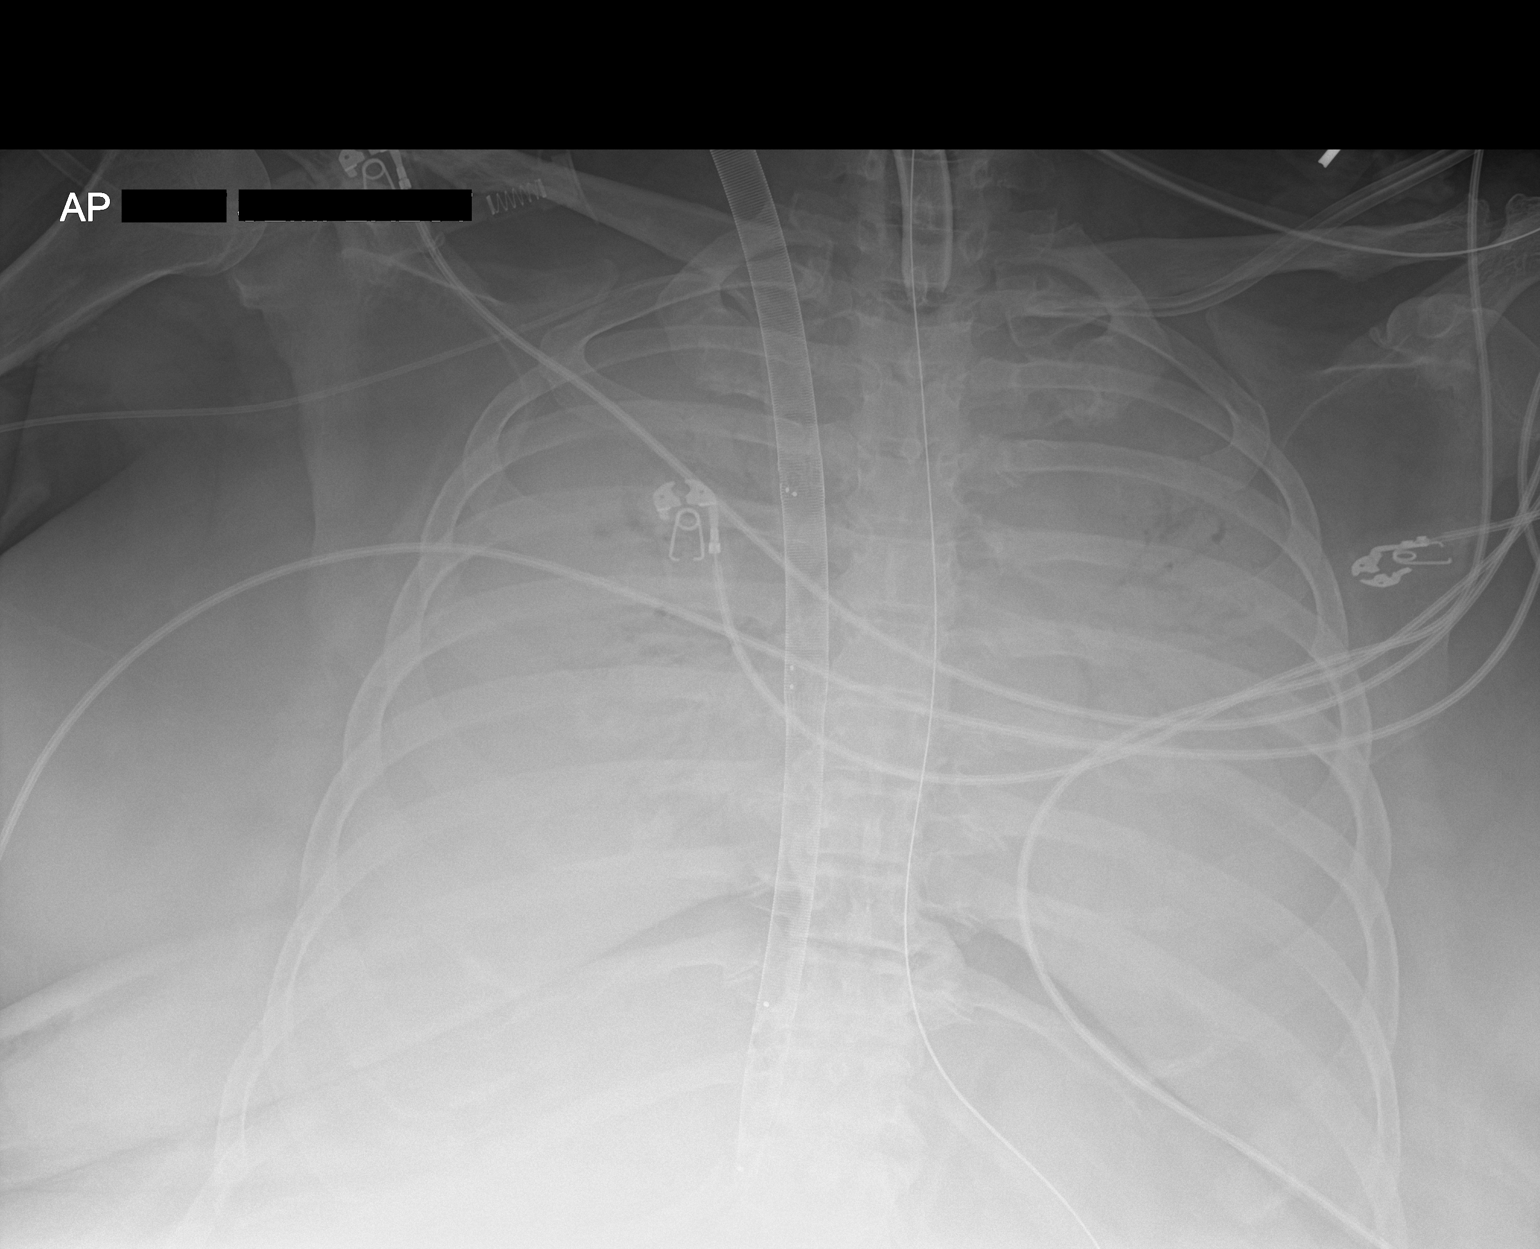

[1 of 1 positions shown; findings below may reference images not displayed]

FINDINGS: Tracheostomy tube, NG tube, right PICC line, ECMO device in stable
position. Left subclavian line stable position with tip over left
subclavian vein. Complete consolidation of both lungs again noted.
Heart is obscured. Prior cervical fusion.
IMPRESSION: 1.  Lines and tubes stable position.

2. Complete consolidation of both lungs again noted. No interim
change.

## 2021-10-08 IMAGING — DX DG CHEST 1V PORT
1 series · 1 of 1 positions shown · non-contrast
Comparison: 02/15/2020

CLINICAL DATA: [id number] infection on ECMO

EXAM:
PORTABLE CHEST 1 VIEW

[chest]
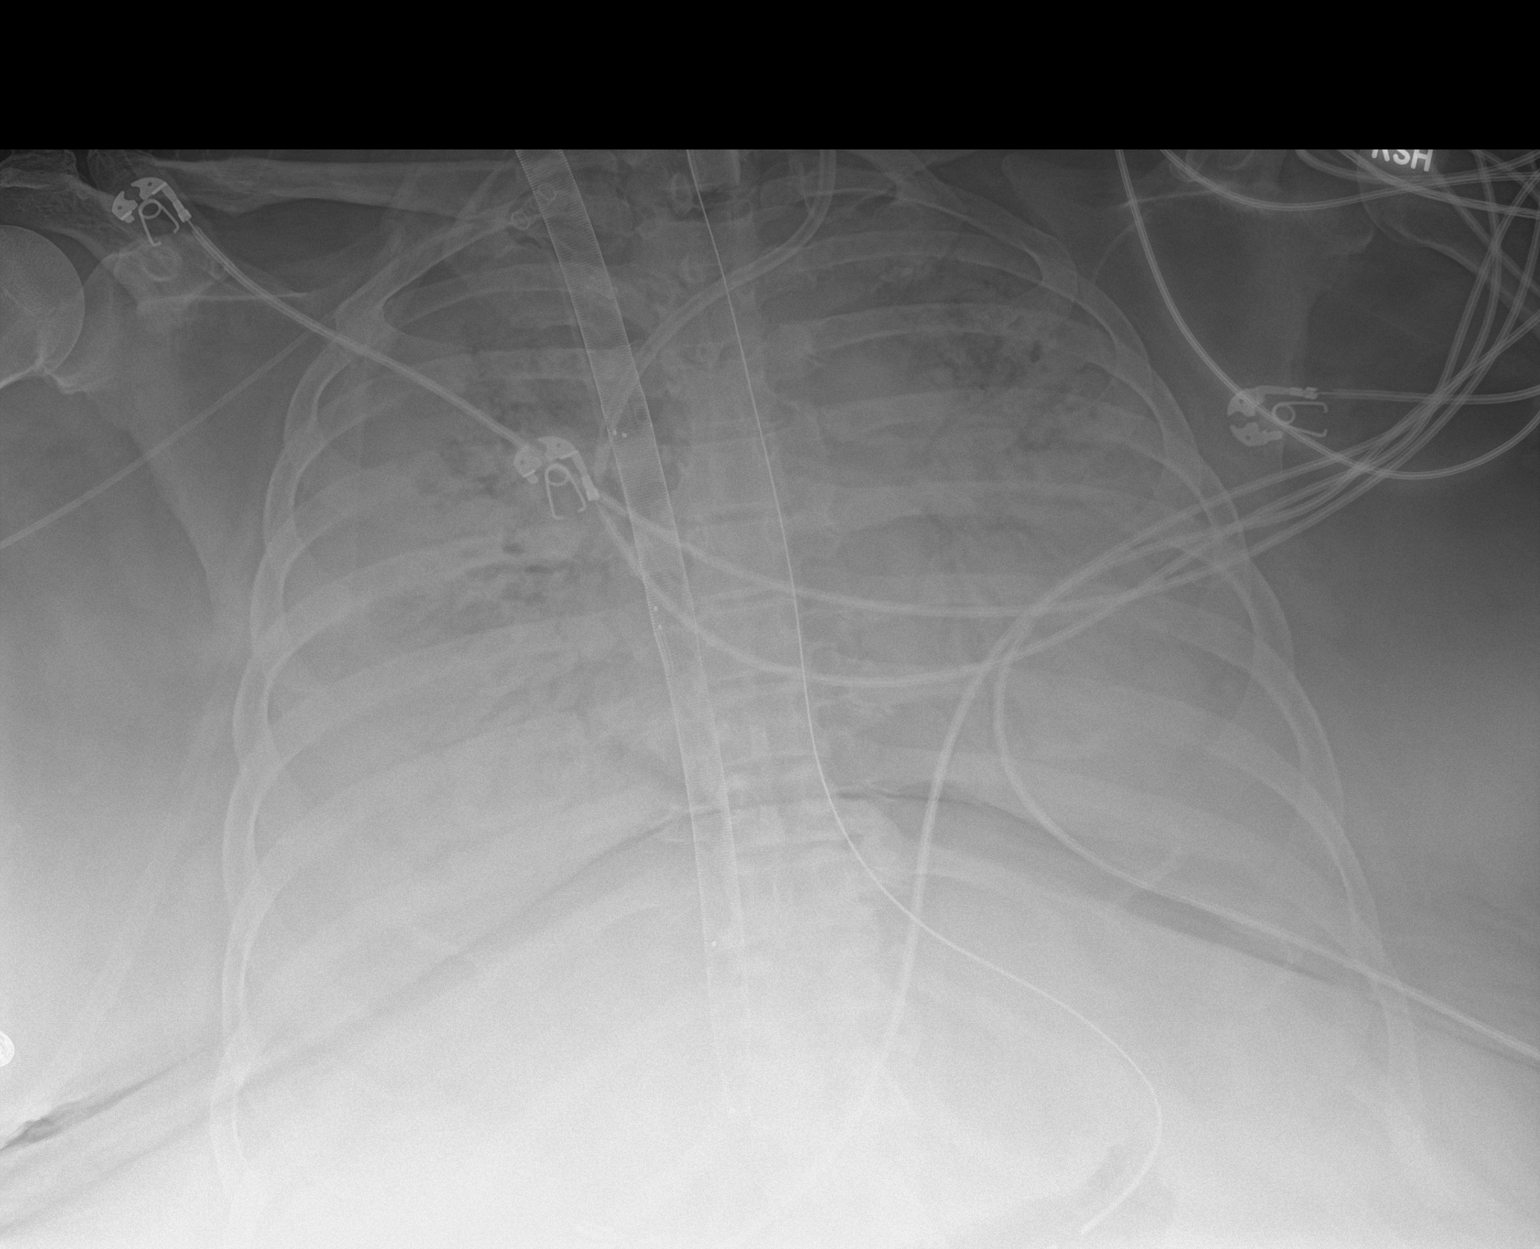

[1 of 1 positions shown; findings below may reference images not displayed]

FINDINGS: ECMO cannula, tracheostomy tube and left jugular temporary dialysis
catheter are again noted and stable. Right-sided PICC line is seen
extending to the cavoatrial junction. Gastric catheter extends into
the stomach. Diffuse bilateral airspace opacity is noted with air
bronchograms similar to that seen on the prior exam. No significant
improvement is noted. No bony abnormality is seen.
IMPRESSION: Diffuse bilateral opacities consistent with the given clinical
history.

Tubes and lines as described stable in appearance.

## 2021-10-08 IMAGING — XA IR PERC PLACEMENT GASTROSTOMY
3 series · 6 of 6 positions shown · non-contrast
Comparison: none

INDICATION: 54-year-old female currently on ECMO and at risk for aspiration. She
requires percutaneous gastrojejunostomy access to allow for enteral
feeding.

[Series 1: fl (-) angio · 1 of 1 slices shown (1 of 3)]
[im 1/1]
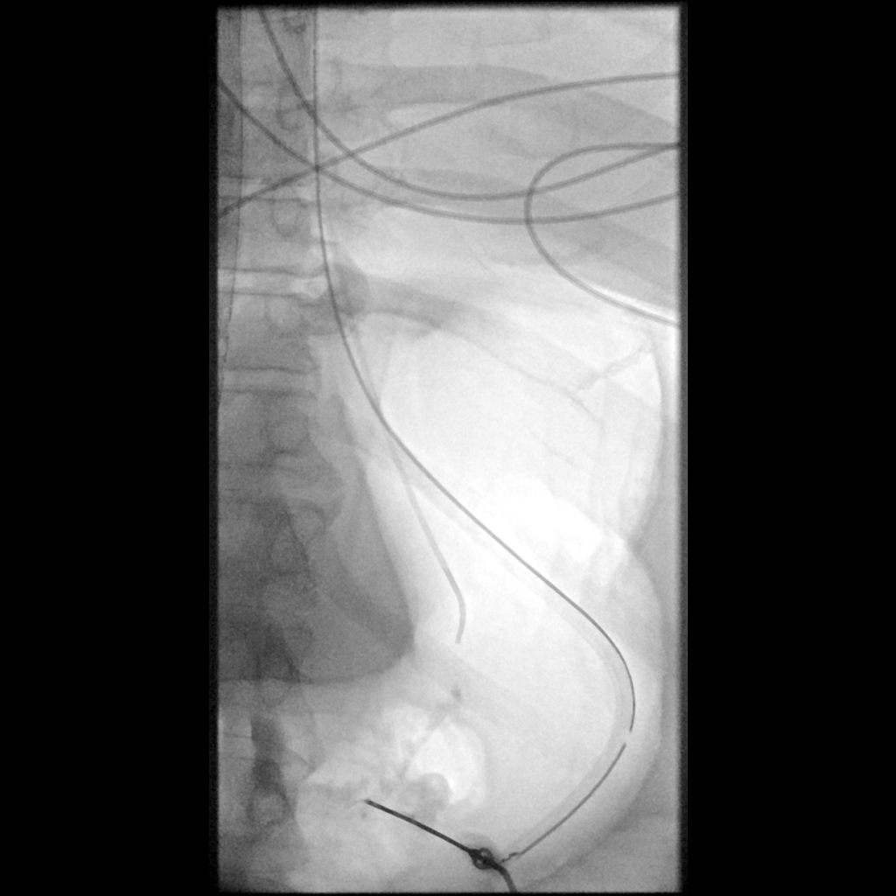

[Series 2: fl (-) angio · 2 of 2 slices shown (2 of 3)]
[im 1/2]
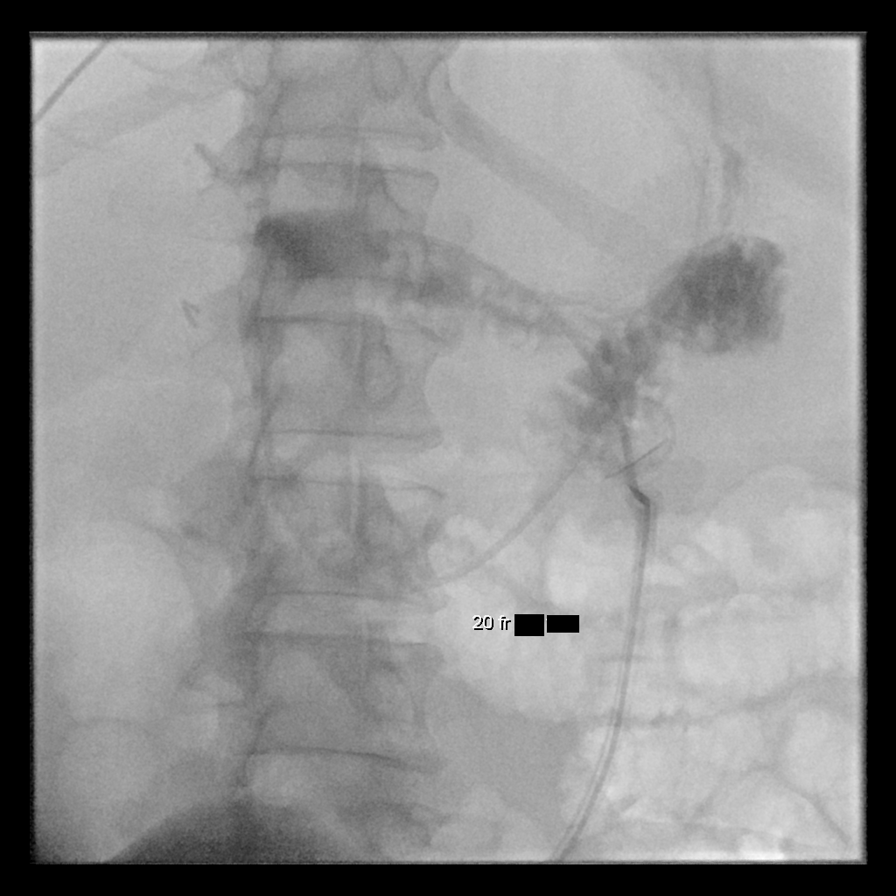
[im 2/2]
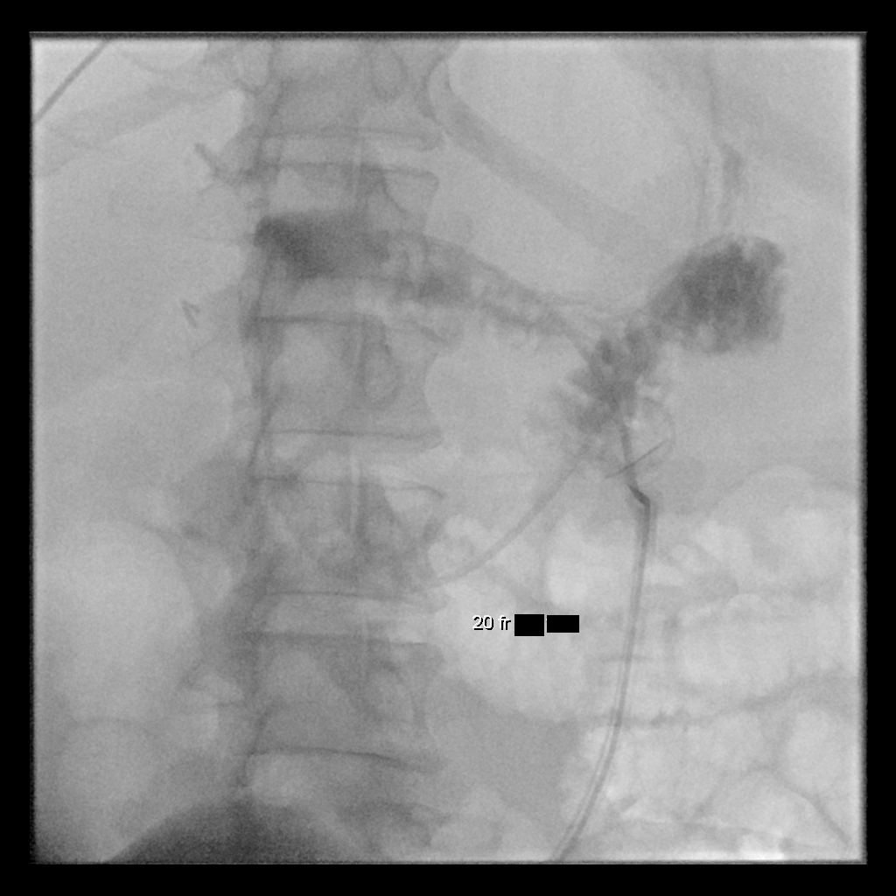

[Series 3: fl (-) angio · 3 of 3 slices shown (3 of 3)]
[im 1/3]
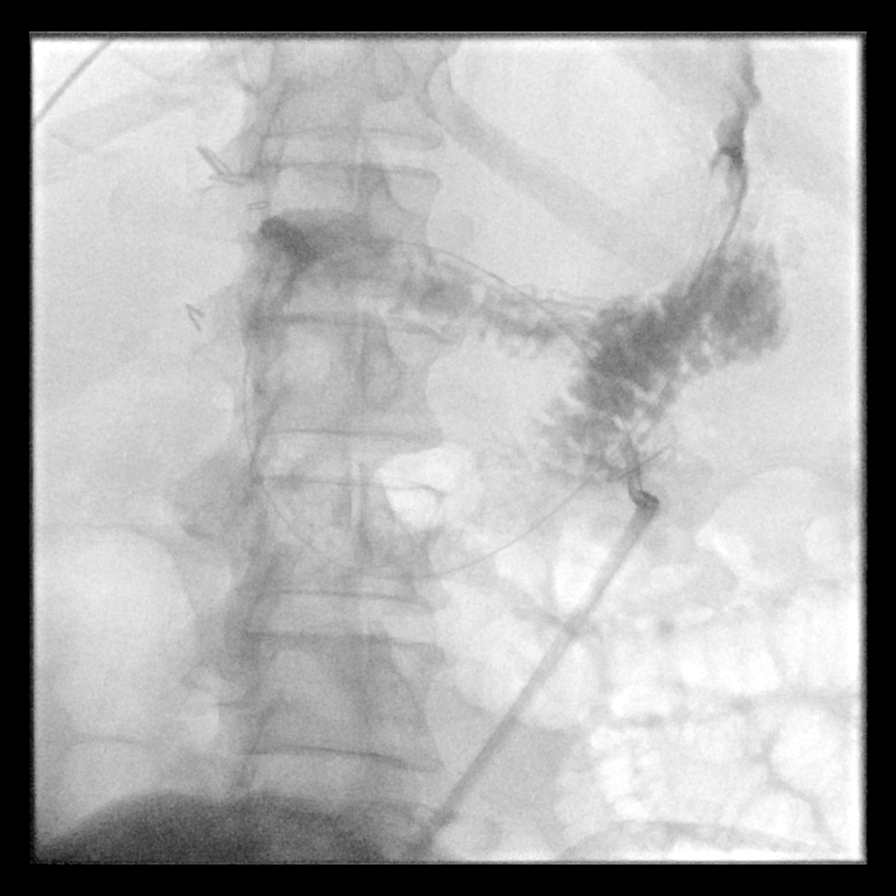
[im 2/3]
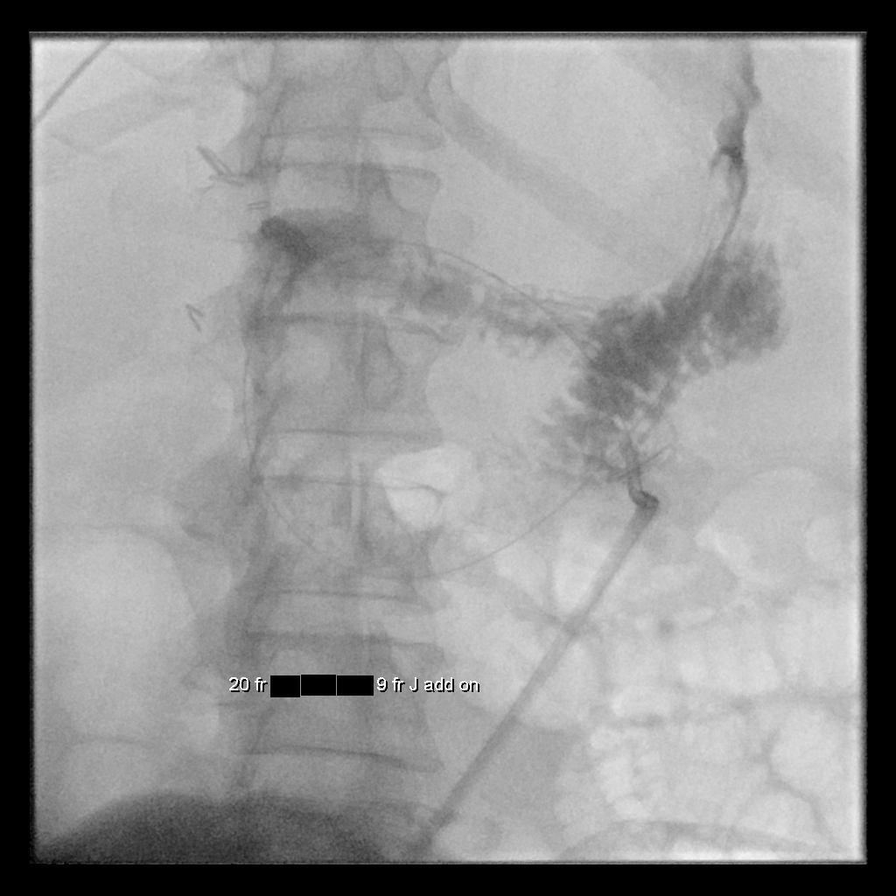
[im 3/3]
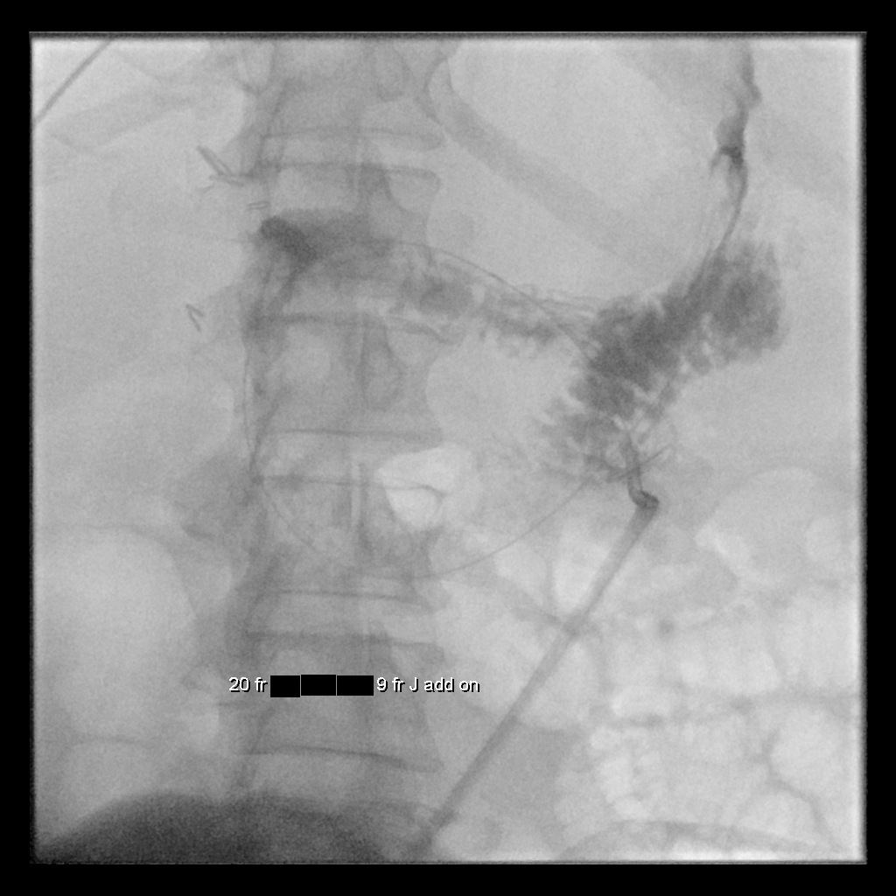

[6 of 6 positions shown; findings below may reference images not displayed]

EXAM:
Fluoroscopically guided placement of percutaneous pull-through
gastrostomy tube

Conversion to gastrojejunostomy tube

MEDICATIONS:
2 g Ancef; Antibiotics were administered within 1 hour of the
procedure.

ANESTHESIA/SEDATION:
Versed 2 mg IV; Fentanyl 50 mcg IV

Moderate Sedation Time:  22 minutes

The patient was continuously monitored during the procedure by the
interventional radiology nurse under my direct supervision.

CONTRAST:  25mL OMNIPAQUE IOHEXOL 300 MG/ML  SOLN

FLUOROSCOPY TIME:  Fluoroscopy Time: 8 minutes 42 seconds (145 mGy).

COMPLICATIONS:
None immediate.

PROCEDURE:
Informed written consent was obtained from the patient after a
thorough discussion of the procedural risks, benefits and
alternatives. All questions were addressed. Maximal Sterile Barrier
Technique was utilized including caps, mask, sterile gowns, sterile
gloves, sterile drape, hand hygiene and skin antiseptic. A timeout
was performed prior to the initiation of the procedure.

Maximal barrier sterile technique utilized including caps, mask,
sterile gowns, sterile gloves, large sterile drape, hand hygiene,
and chlorhexadine skin prep.

An angled catheter was advanced over a wire under fluoroscopic
guidance through the nose, down the esophagus and into the body of
the stomach. The stomach was then insufflated with several 100 ml of
air. Fluoroscopy confirmed location of the gastric bubble, as well
as inferior displacement of the barium stained colon. Under direct
fluoroscopic guidance, a single T-tack was placed, and the anterior
gastric wall drawn up against the anterior abdominal wall.
Percutaneous access was then obtained into the mid gastric body with
an 18 gauge sheath needle. Aspiration of air, and injection of
contrast material under fluoroscopy confirmed needle placement.

An Amplatz wire was advanced in the gastric body and the access
needle exchanged for a 9-French vascular sheath. A snare device was
advanced through the vascular sheath and an Amplatz wire advanced
through the angled catheter. The Amplatz wire was successfully
snared and this was pulled up through the esophagus and out the
mouth. A 20-Deiby Miguel Isrrael tube was then connected to
the snare and pulled through the mouth, down the esophagus, into the
stomach and out to the anterior abdominal wall. Hand injection of
contrast material confirmed intragastric location. The T-tack
retention suture was then cut.

A C2 cobra catheter and Glidewire were then advanced coaxially
through the gastrostomy tube into the stomach. The catheter and wire
combination was successfully advanced through the pylorus and into
the proximal small bowel. A 9 French jejunal limb was then advanced
over the wire and position with the tip in the proximal jejunum. A
gentle hand injection of contrast material was performed confirming
the location of the tube.

The external bumper was secured in place. The tube was flushed
through the jejunal and gastric lumen and both were capped.

The patient will be observed for several hours with the newly placed
tube on low wall suction to evaluate for any post procedure
complication. The patient tolerated the procedure well, there is no
immediate complication.
IMPRESSION: Successful placement of a 20 French pull through gastrostomy tube
converted to a gastro jejunostomy tumor with the addition of a 9
French jejunal arm.

The J arm tip is in the proximal jejunum and ready for use.

## 2021-10-09 IMAGING — DX DG CHEST 1V PORT
1 series · 1 of 1 positions shown · non-contrast
Comparison: 02/16/2020

CLINICAL DATA: Cardio respiratory failure, ECMO.

EXAM:
PORTABLE CHEST 1 VIEW

[chest]
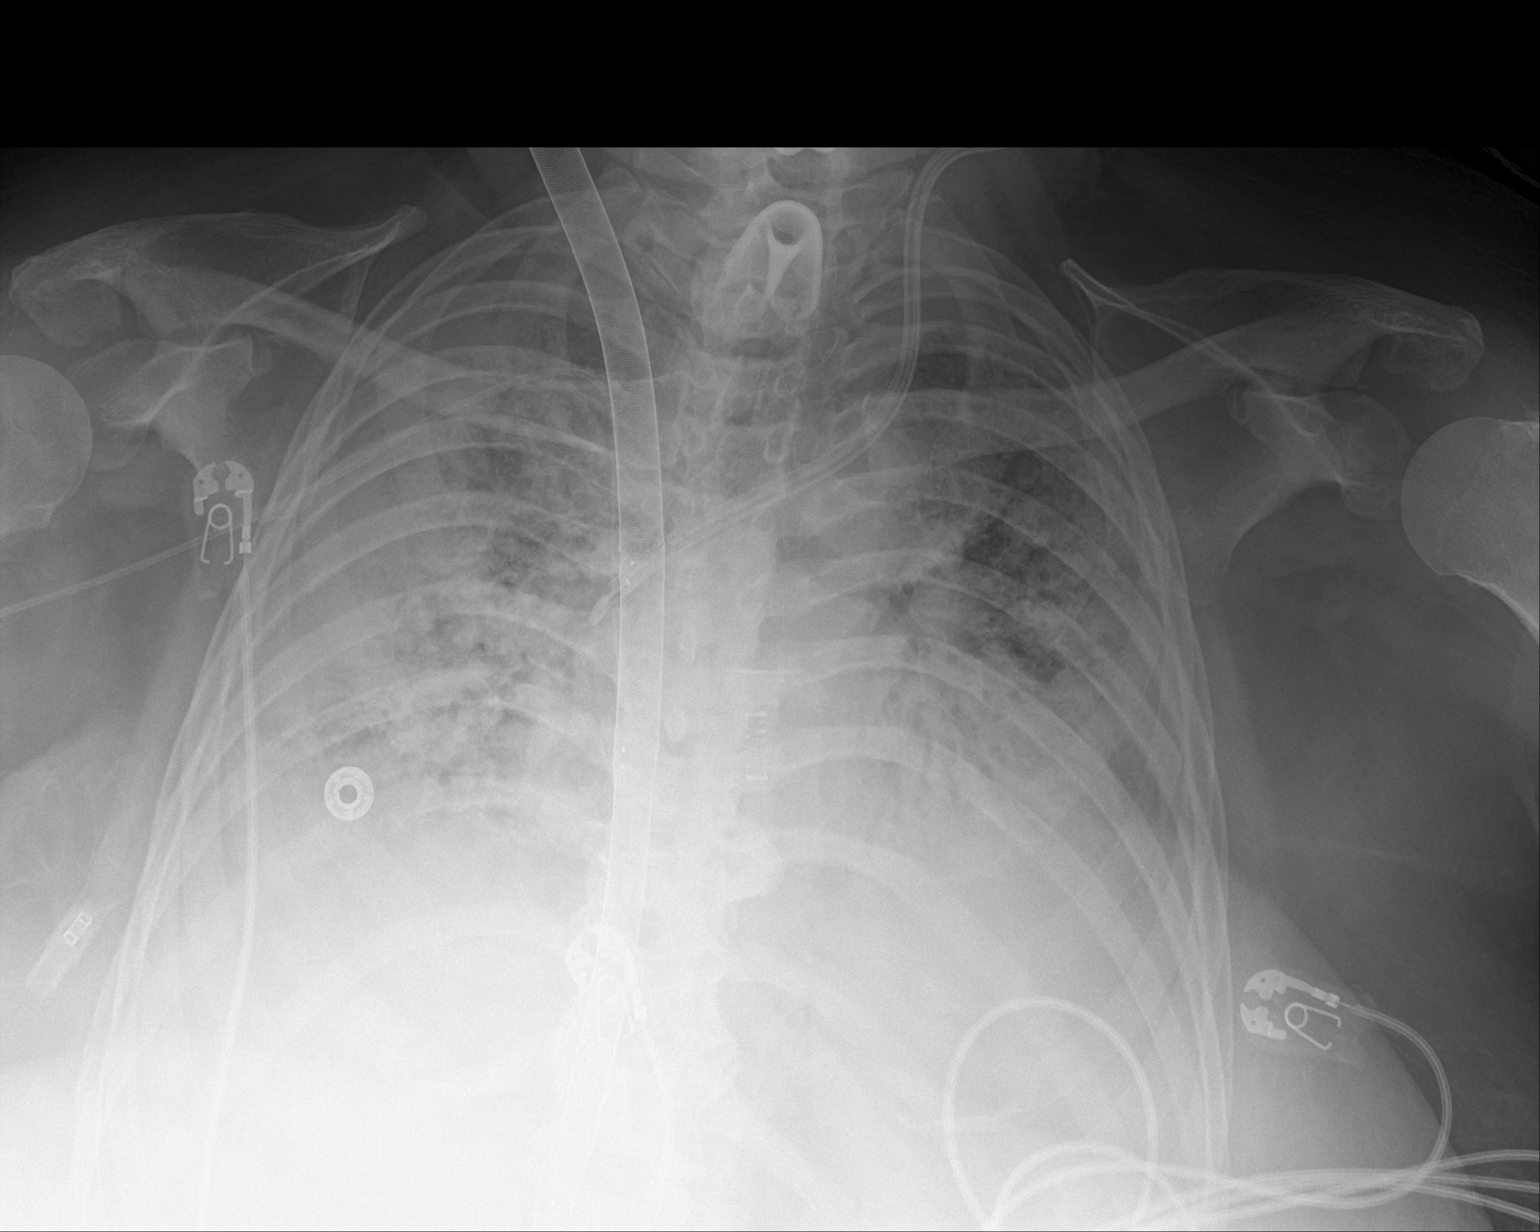

[1 of 1 positions shown; findings below may reference images not displayed]

FINDINGS: RIGHT-sided PICC line obscured by ECMO cannula, extends into the mid
to distal superior vena cava. LEFT-sided dual lumen catheter
terminates at the proximal portion of the superior vena cava.

ECMO cannula in stable position.

Slightly improved aeration in the chest compared to the previous
study. There is still diffuse interstitial and airspace disease
largely obscuring the cardiomediastinal silhouette.

Tracheostomy tube remains in place.

Bilateral effusions largely stable.

No acute skeletal process to the extent evaluated.
IMPRESSION: 1. Stable support lines and tubes.
2. Slightly improved aeration in the chest compared to the previous
study.
3. Stable bilateral effusions and with persistent diffuse
interstitial and airspace disease.

## 2021-10-10 IMAGING — DX DG CHEST 1V PORT
1 series · 1 of 1 positions shown · non-contrast
Comparison: February 17, 2020

CLINICAL DATA: Cardio respiratory failure.  ECMO catheter present.

EXAM:
PORTABLE CHEST 1 VIEW

[chest ap]
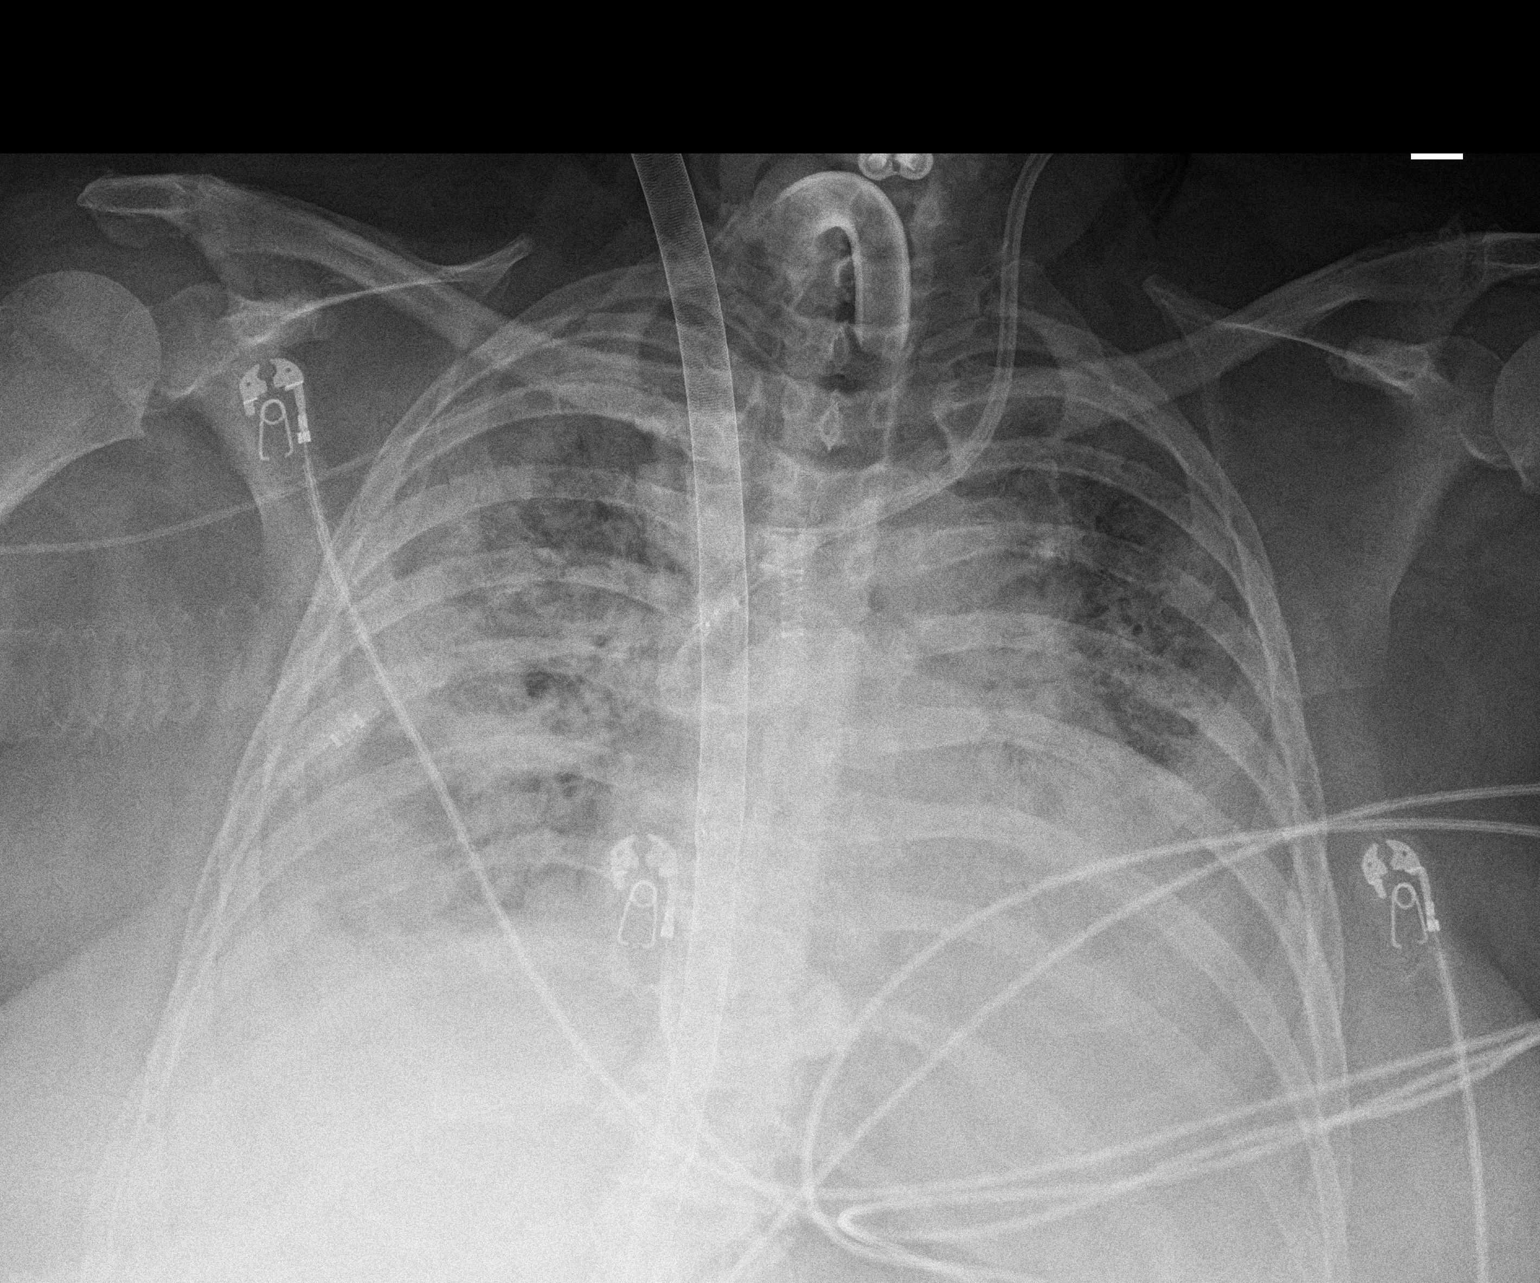

[1 of 1 positions shown; findings below may reference images not displayed]

FINDINGS: Tracheostomy catheter tip is 5.6 cm above the carina. ECMO catheter
seen on the right with tip below the diaphragm. Central catheter tip
from the right side in the superior vena cava. Left-sided central
catheter tip is in the superior vena cava. No pneumothorax. There is
diffuse interstitial and airspace opacity bilaterally, somewhat more
on the right than on the left. Apparent superimposed pleural
effusions noted. Heart upper normal in size with pulmonary
vascularity grossly normal. No adenopathy evident. Postoperative
change lower cervical spine.
IMPRESSION: Tube and catheter positions as described without appreciable
pneumothorax. Persistent pleural effusions with multifocal
interstitial and alveolar opacity, similar to 1 day prior. Stable
cardiac silhouette.

## 2021-10-12 IMAGING — DX DG CHEST 1V PORT
1 series · 1 of 1 positions shown · non-contrast
Comparison: 02/19/2020

CLINICAL DATA: ECMO, respiratory failure, COVID

EXAM:
PORTABLE CHEST 1 VIEW

[chest]
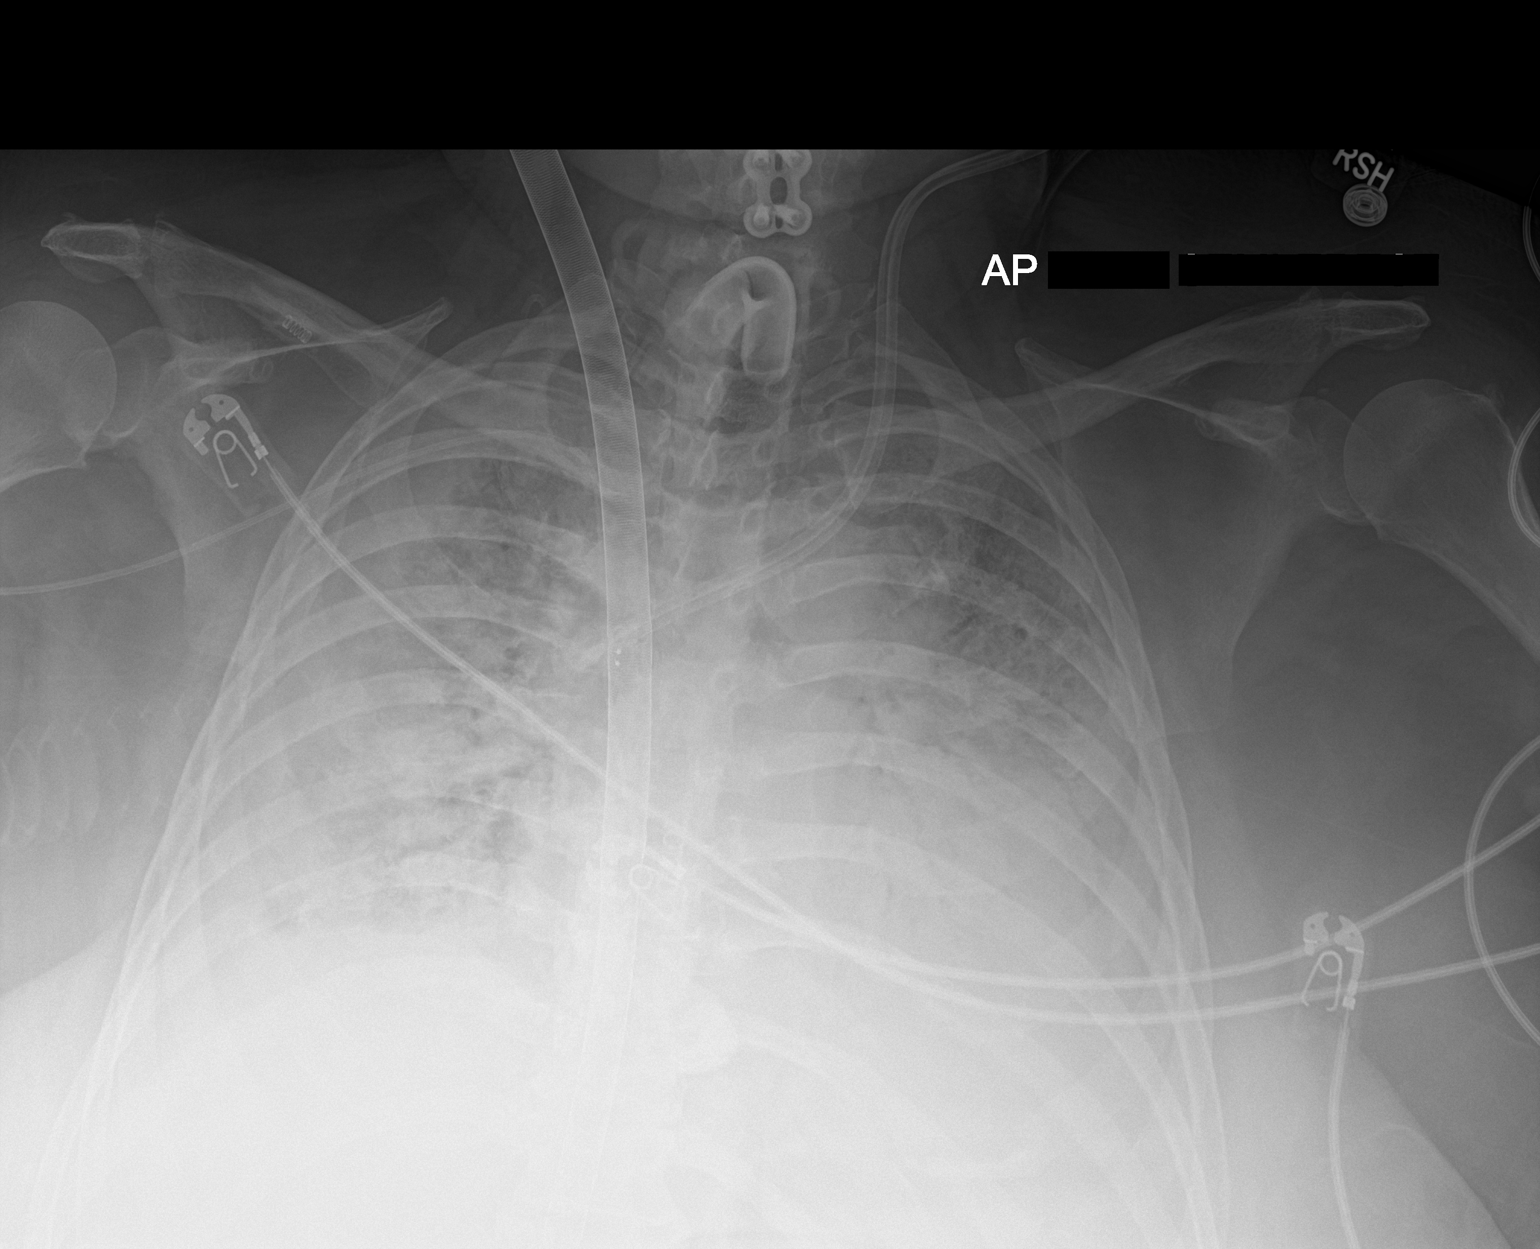

[1 of 1 positions shown; findings below may reference images not displayed]

FINDINGS: Support devices are stable. Severe diffuse bilateral airspace
disease. Mild cardiomegaly. No change since prior study.
IMPRESSION: No interval change.

## 2021-10-13 IMAGING — DX DG CHEST 1V PORT
1 series · 1 of 1 positions shown · non-contrast
Comparison: February 20, 2020.

CLINICAL DATA: Respiratory failure.

EXAM:
PORTABLE CHEST 1 VIEW

[chest ap]
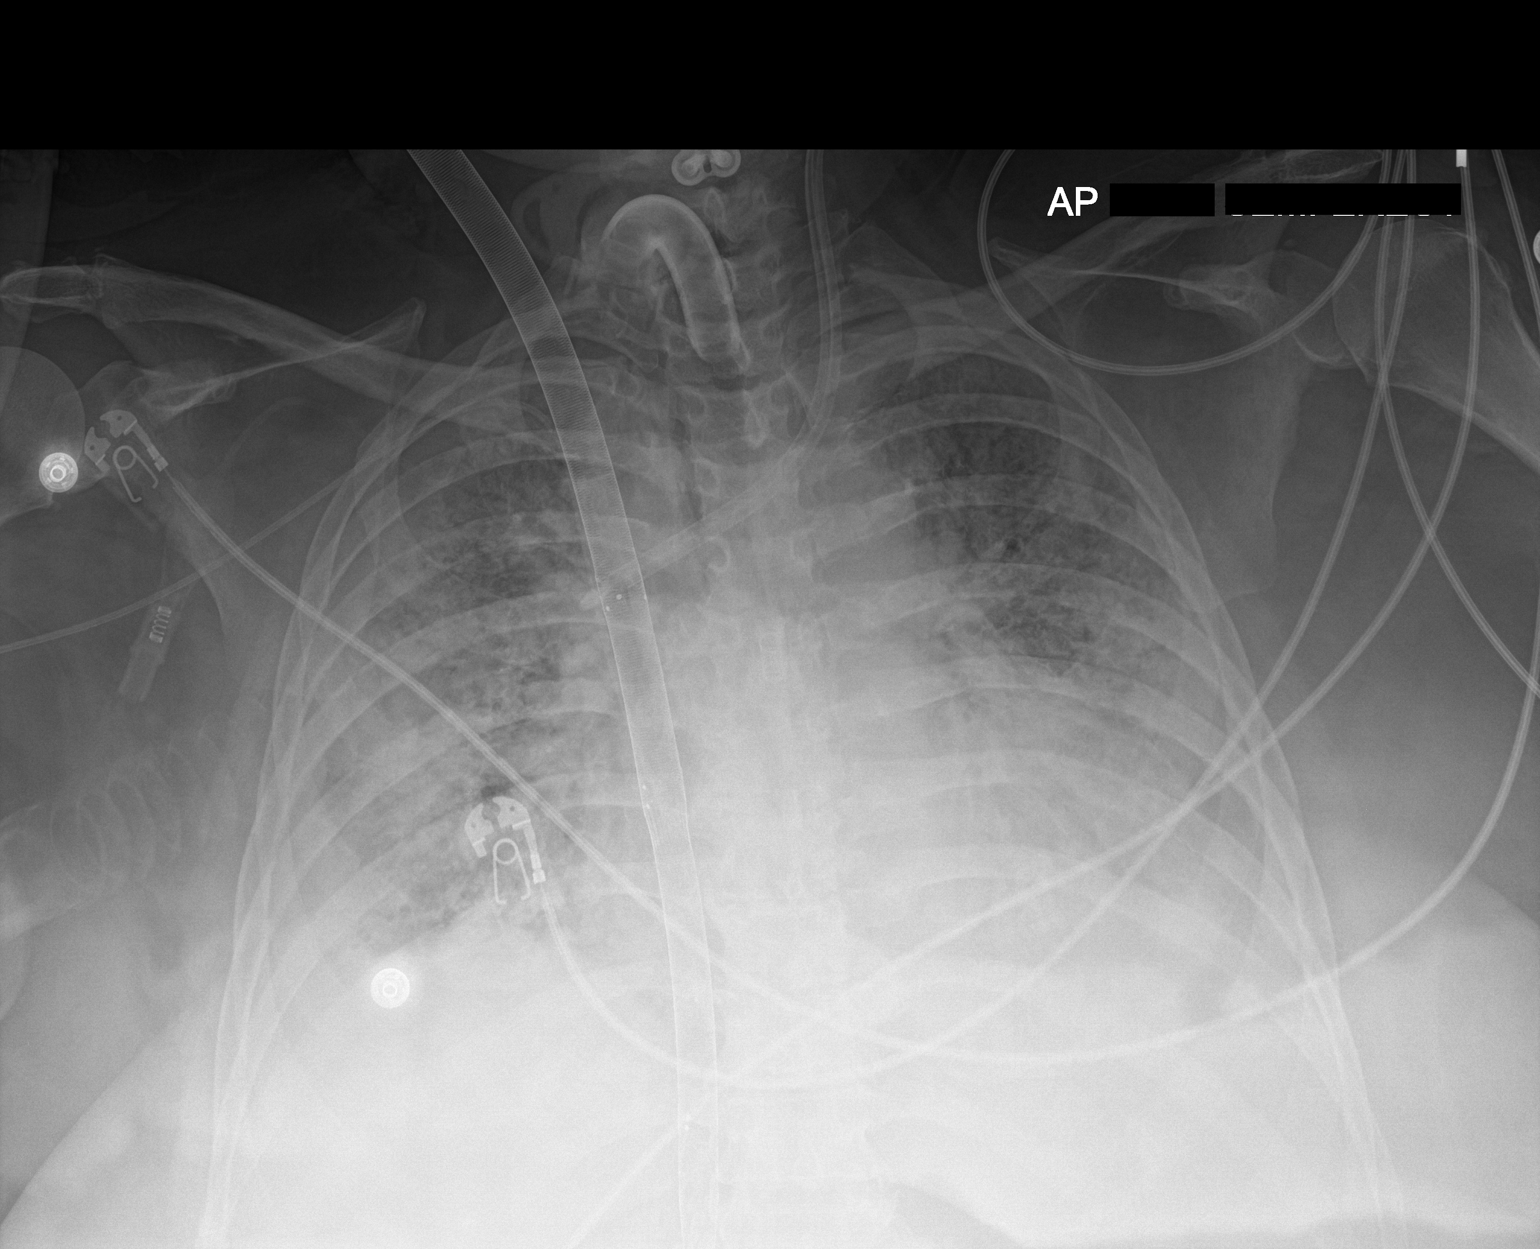

[1 of 1 positions shown; findings below may reference images not displayed]

FINDINGS: Stable cardiomegaly. Tracheostomy is unchanged in position. ECMO
device is unchanged. Left internal jugular catheter is unchanged.
Stable bilateral lung opacities are noted consistent with multifocal
pneumonia. Small pleural effusions may be present. No pneumothorax
is noted. Bony thorax is unremarkable.
IMPRESSION: Stable support apparatus. Stable bilateral lung opacities consistent
with multifocal pneumonia.

## 2021-10-14 IMAGING — DX DG CHEST 1V PORT
1 series · 1 of 1 positions shown · non-contrast
Comparison: February 21, 2020

CLINICAL DATA: Hypoxia

EXAM:
PORTABLE CHEST 1 VIEW

[chest]
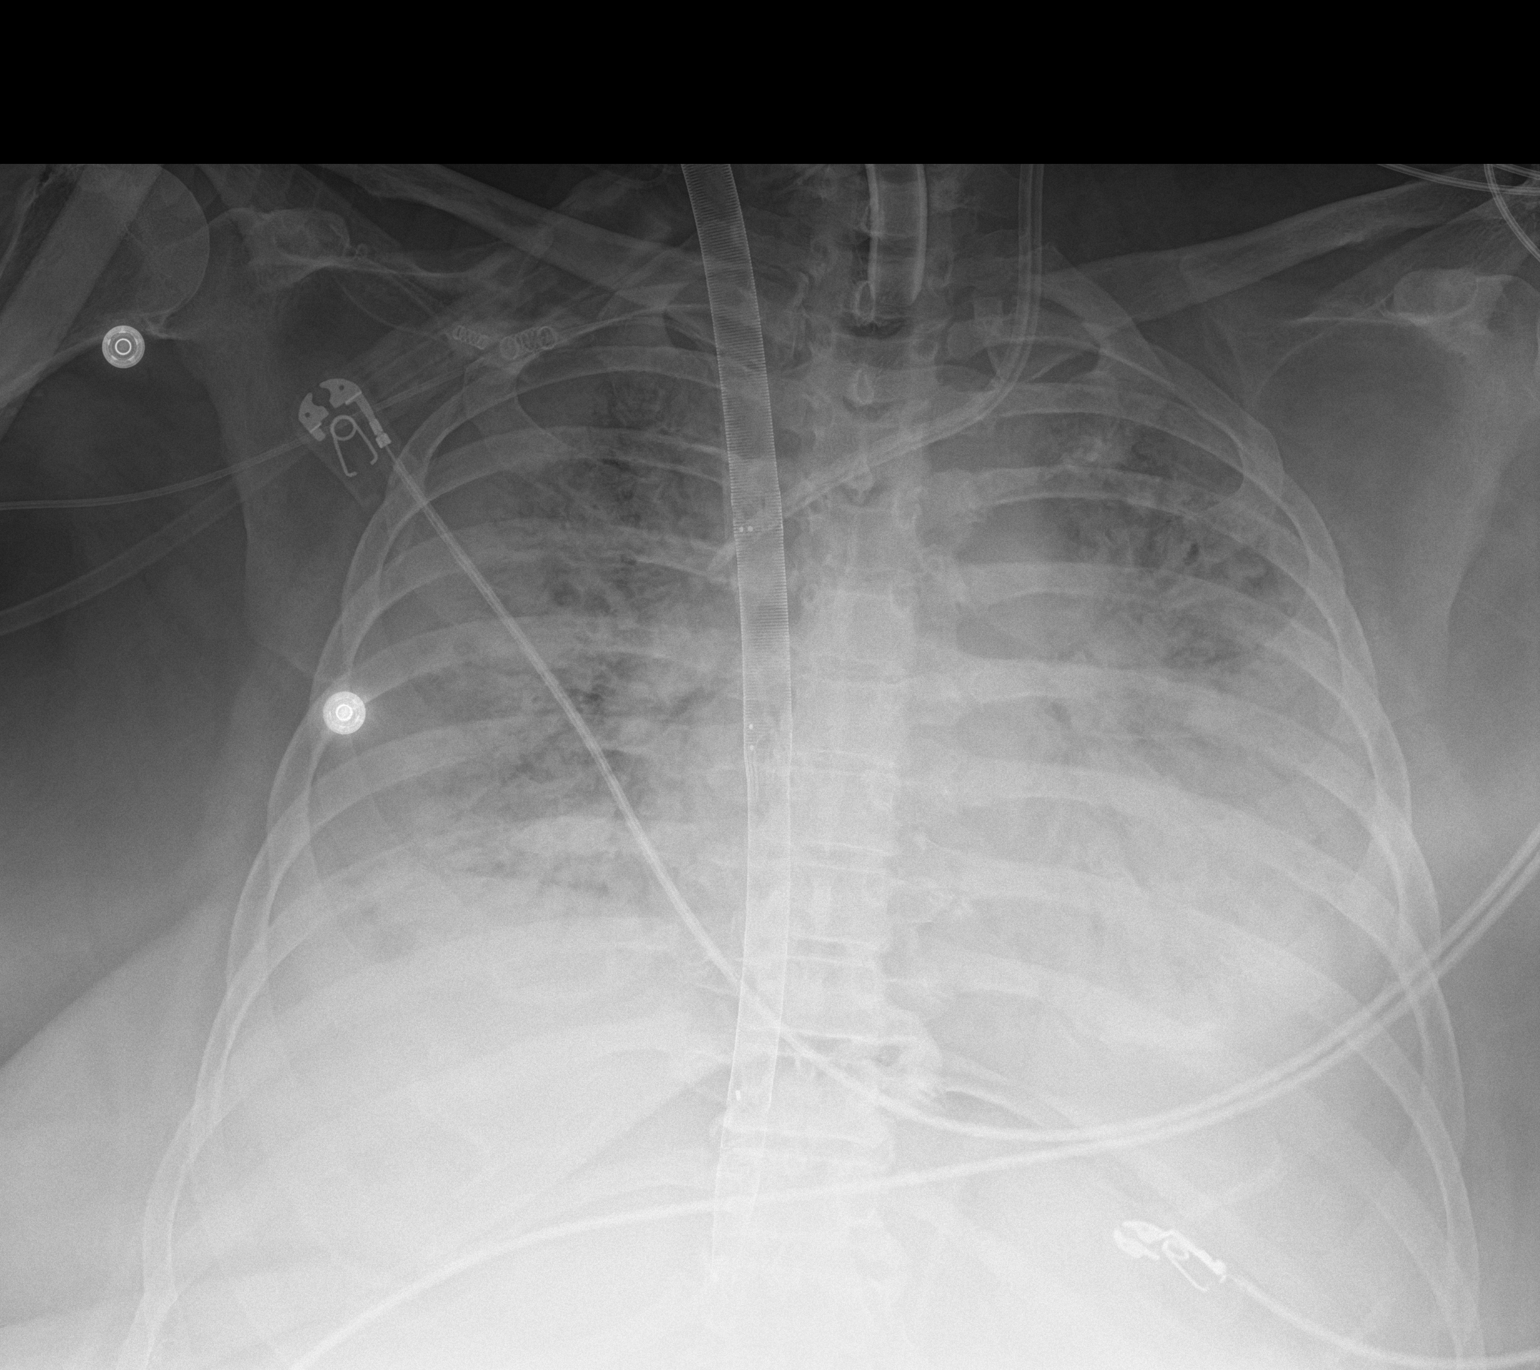

[1 of 1 positions shown; findings below may reference images not displayed]

FINDINGS: Tracheostomy catheter tip is 6.7 cm above the carina. ECMO catheter
tip is in the inferior vena cava. Left jugular catheter tip is in
the superior vena cava. No pneumothorax. There is widespread
airspace opacity bilaterally. There is stable cardiomegaly. There is
pulmonary venous hypertension. No adenopathy appreciable.
Postoperative change noted in the lower cervical spine.
IMPRESSION: Tube and catheter positions as described without pneumothorax.
Persistent widespread airspace opacity throughout the lungs
bilaterally. Suspect multifocal pneumonia with questionable degree
of superimposed edema and/or ARDS. There is stable cardiomegaly with
pulmonary vascular congestion.

## 2021-10-15 IMAGING — DX DG CHEST 1V PORT
1 series · 1 of 1 positions shown · non-contrast
Comparison: 02/22/2020

CLINICAL DATA: Respiratory failure, ECMO

EXAM:
PORTABLE CHEST 1 VIEW

[chest]
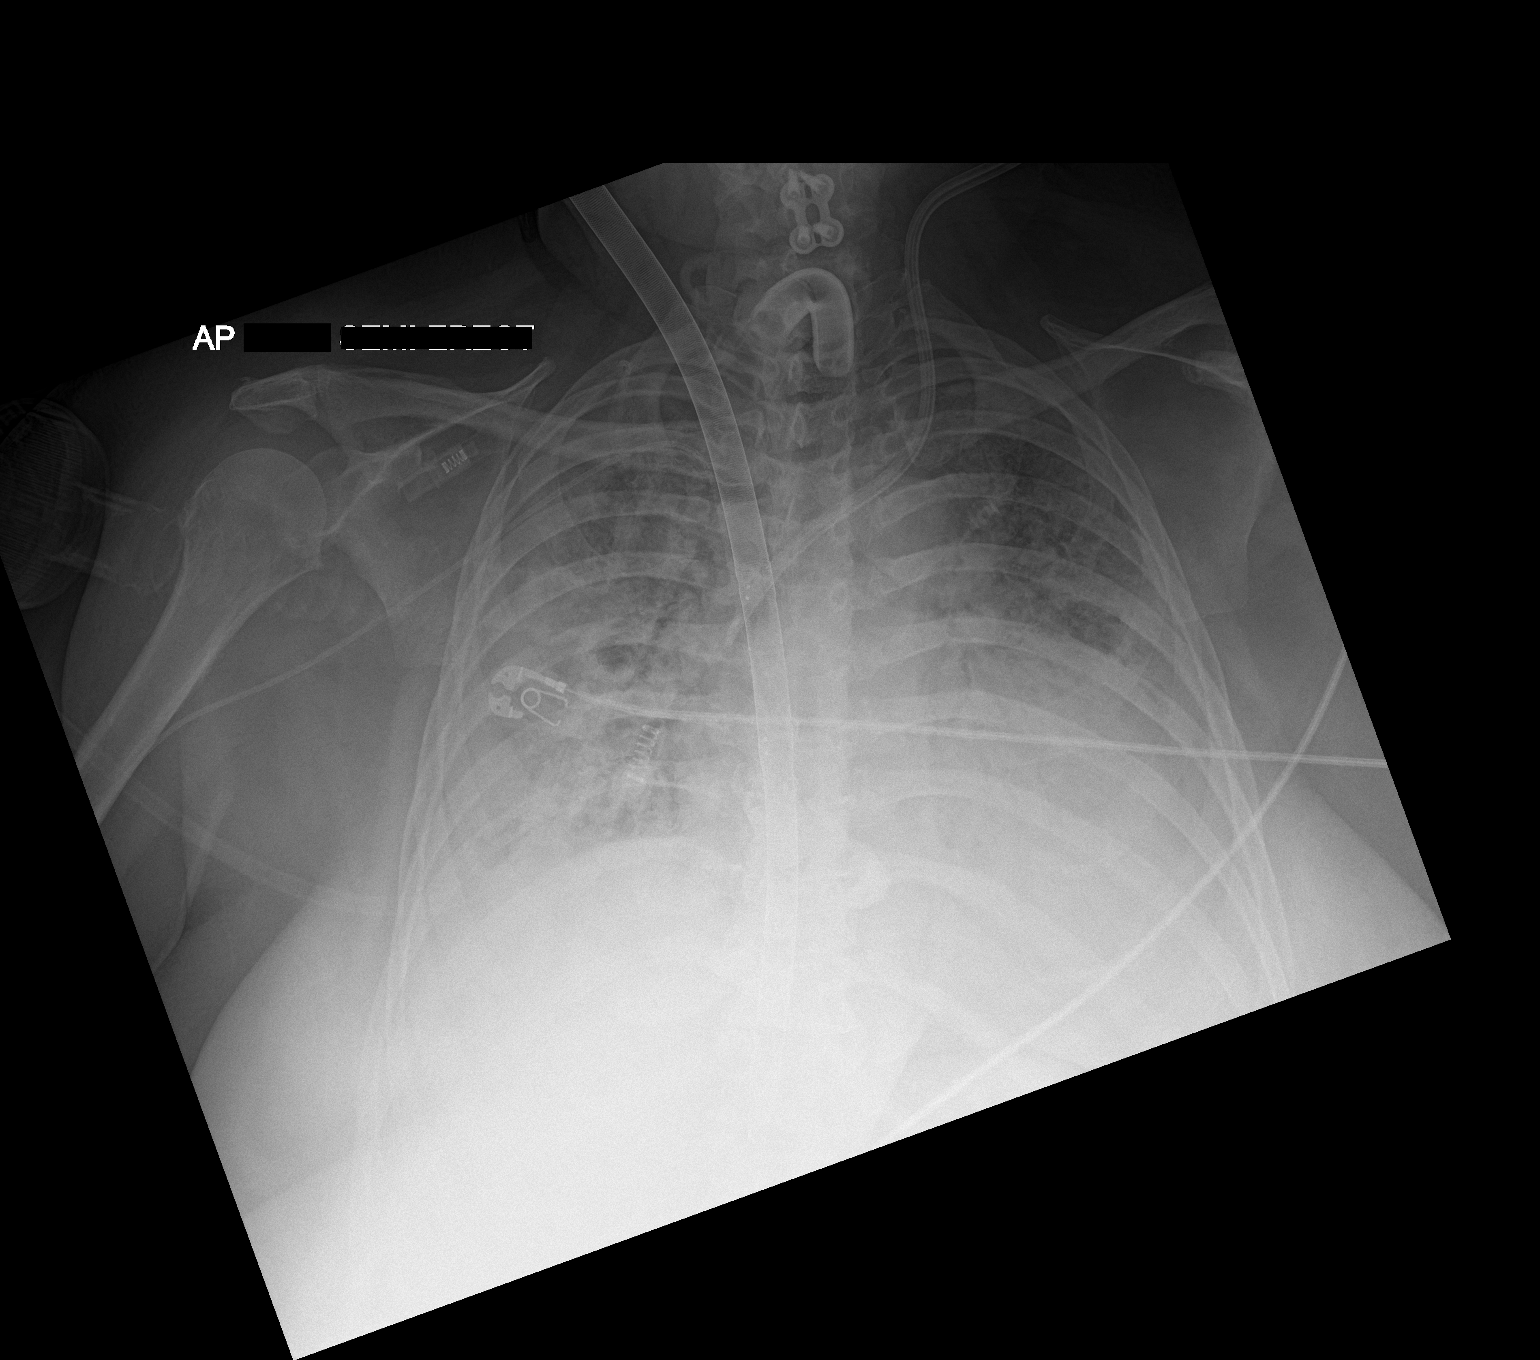

[1 of 1 positions shown; findings below may reference images not displayed]

FINDINGS: Support devices are stable. Extensive bilateral airspace disease. No
change. Mild cardiomegaly. No visible effusions or pneumothorax.
IMPRESSION: Support devices stable.

Stable extensive diffuse bilateral airspace disease.

## 2021-10-16 IMAGING — DX DG CHEST 1V PORT
1 series · 1 of 1 positions shown · non-contrast
Comparison: February 23, 2020

CLINICAL DATA: Patient on ECMO.

EXAM:
PORTABLE CHEST 1 VIEW

[chest]
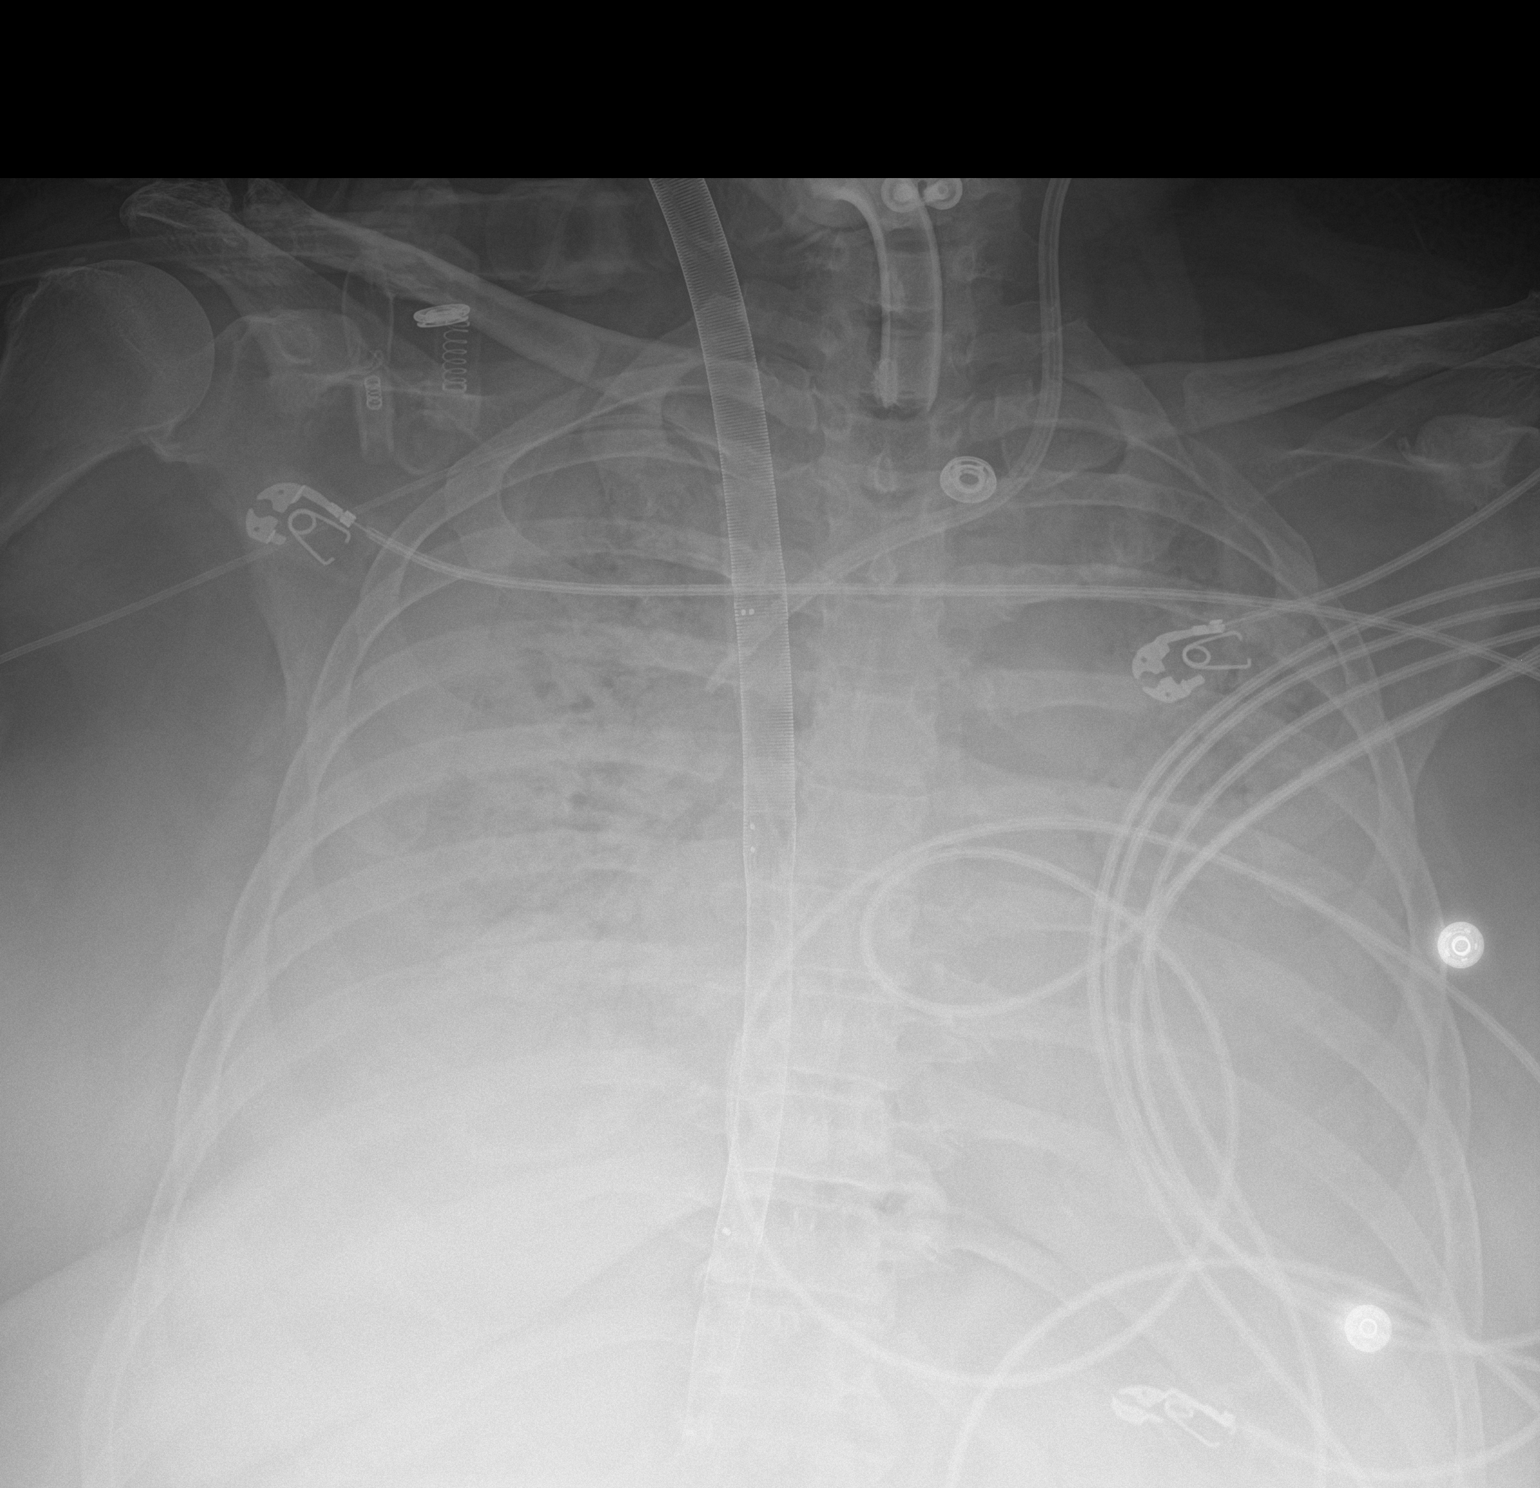

[1 of 1 positions shown; findings below may reference images not displayed]

FINDINGS: The left central line, tracheostomy tube, right PICC line, an ECMO
device are stable. No pneumothorax. Diffuse bilateral pulmonary
infiltrates are more pronounced in the interval. No other changes.
IMPRESSION: 1. Support apparatus as above.
2. Diffuse bilateral pulmonary infiltrates appear worsened in the
interval.

## 2021-10-17 IMAGING — DX DG CHEST 1V PORT
1 series · 1 of 1 positions shown · non-contrast
Comparison: 02/24/2020

CLINICAL DATA: ECMO.

EXAM:
PORTABLE CHEST 1 VIEW

[chest]
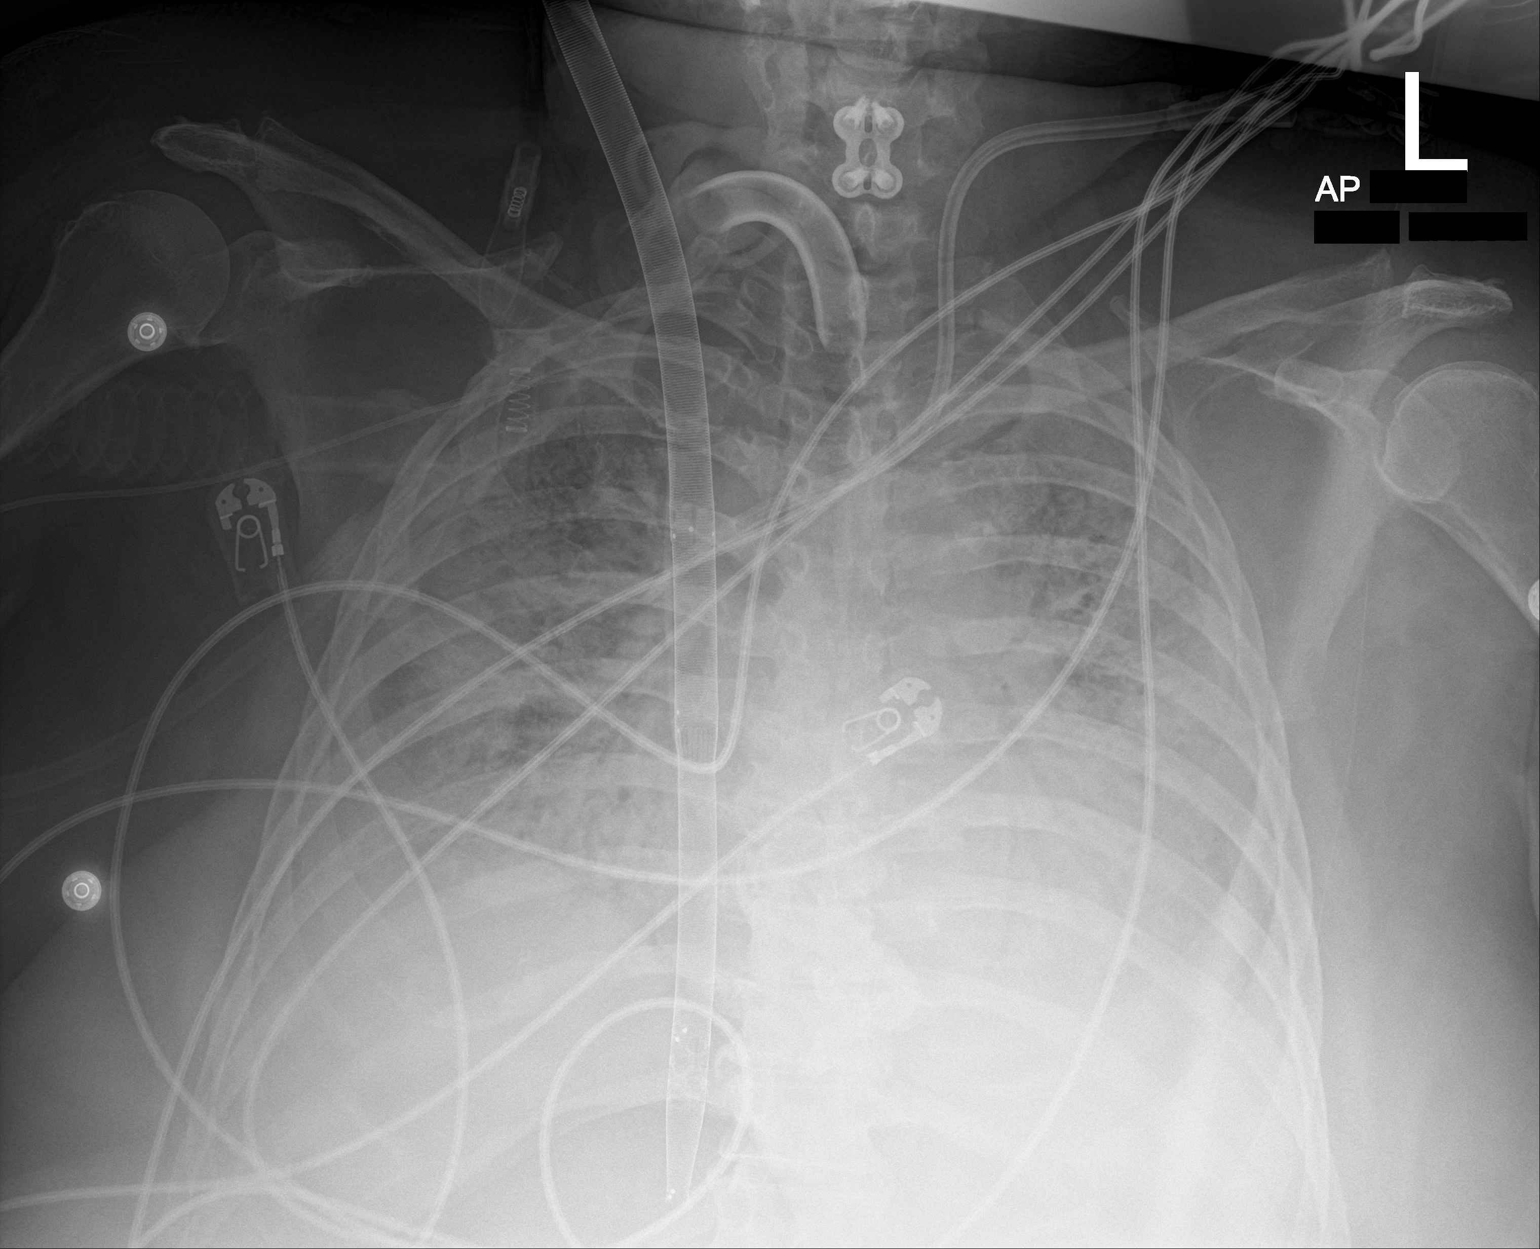

[1 of 1 positions shown; findings below may reference images not displayed]

FINDINGS: Tracheostomy tube and left IJ central venous catheter are unchanged.
Right-sided PICC line unchanged. ECMO apparatus unchanged.

Lungs are somewhat hypoinflated with persistent diffuse bilateral
airspace opacification with slightly improved overall aeration
bilaterally. Possible small amount of bilateral pleural fluid
unchanged. Cardiomediastinal silhouette and remainder of the exam is
unchanged.
IMPRESSION: 1. Persistent diffuse bilateral airspace process with slightly
improved aeration bilaterally. Possible small amount bilateral
pleural fluid unchanged.
2. Tubes and lines unchanged.

## 2021-10-19 IMAGING — DX DG CHEST 1V PORT
1 series · 1 of 1 positions shown · non-contrast
Comparison: 02/26/2020.

CLINICAL DATA: Tracheostomy.  ECMO.

EXAM:
PORTABLE CHEST 1 VIEW

[chest ap]
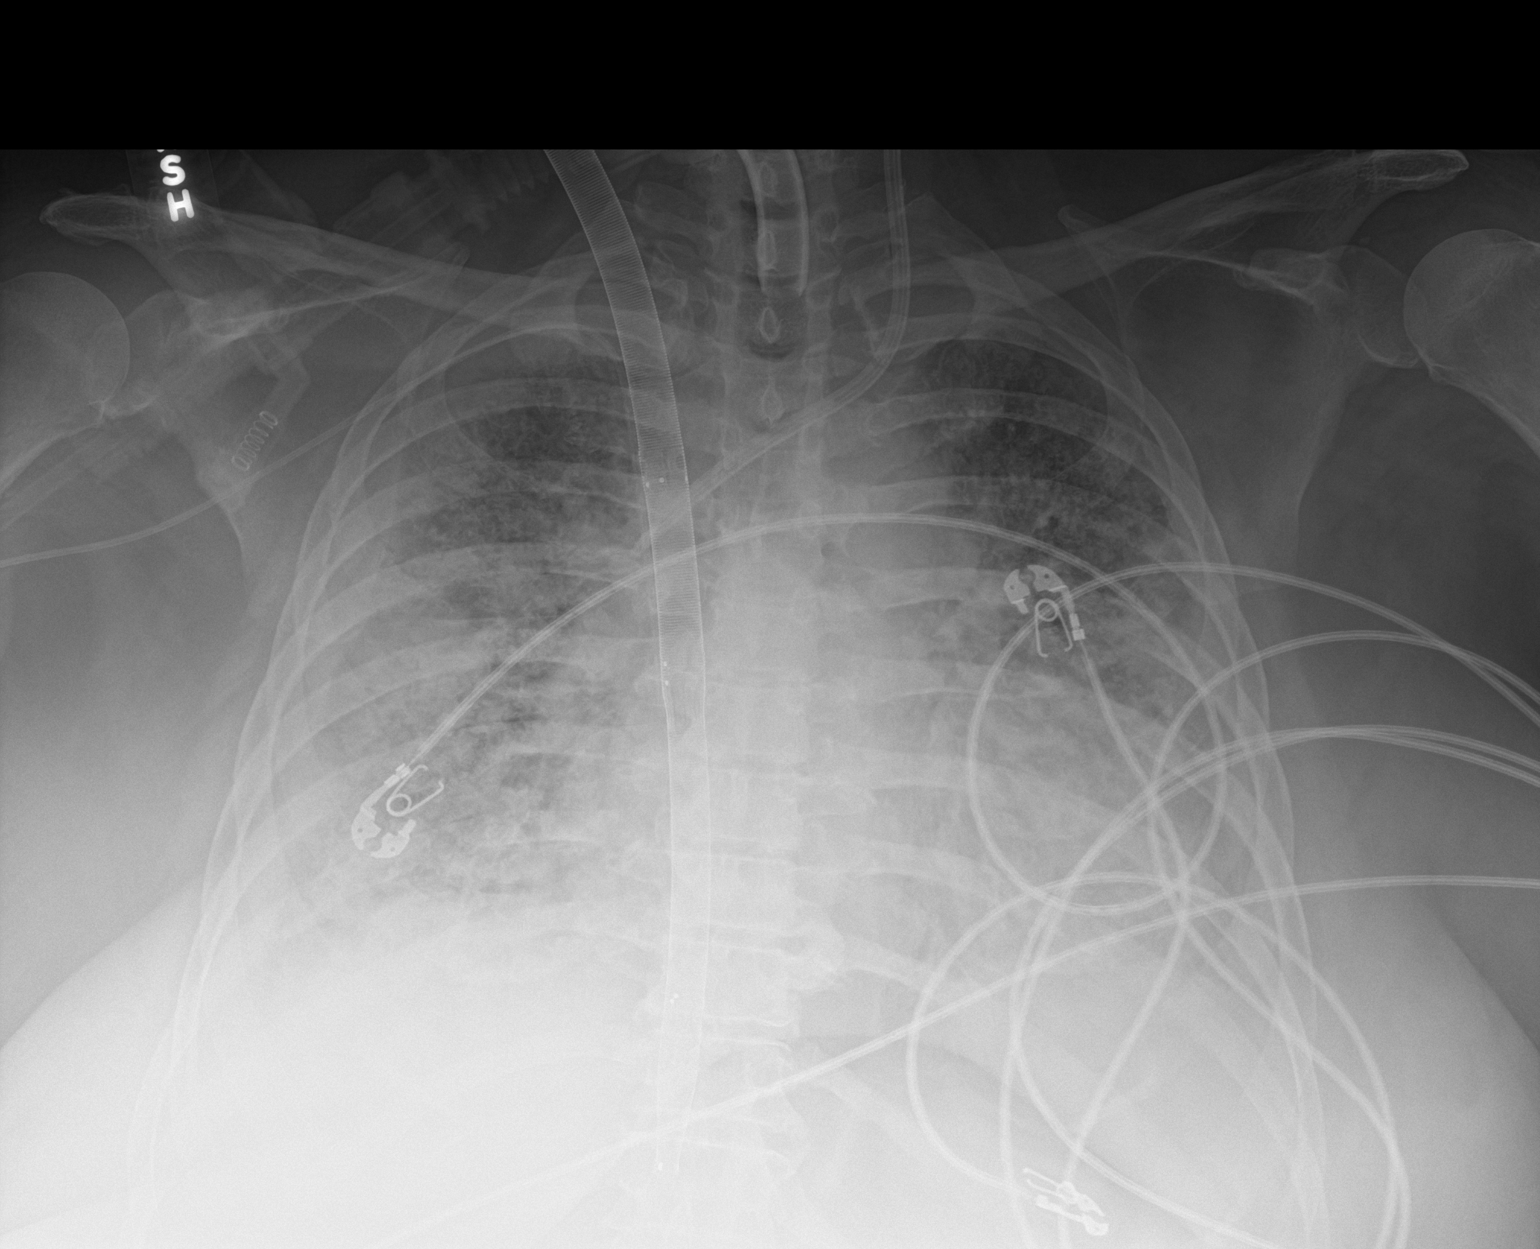

[1 of 1 positions shown; findings below may reference images not displayed]

FINDINGS: Tracheostomy tube, left IJ line, right PICC line in stable position.
ECMO device in unchanged position. Heart size stable. Diffuse severe
bilateral pulmonary infiltrates again noted without interim change.
Small right pleural effusion cannot be excluded. No pneumothorax.
Prior cervical spine fusion.
IMPRESSION: 1.  Lines and tubes in unchanged position.

2. Diffuse severe bilateral pulmonary infiltrates again noted
without interim change. Small right pleural effusion cannot be
excluded.

## 2021-10-20 IMAGING — DX DG CHEST 1V PORT
1 series · 1 of 1 positions shown · non-contrast
Comparison: 02/27/2020

CLINICAL DATA: Trach, ARDS, ECMO

EXAM:
PORTABLE CHEST 1 VIEW

[chest]
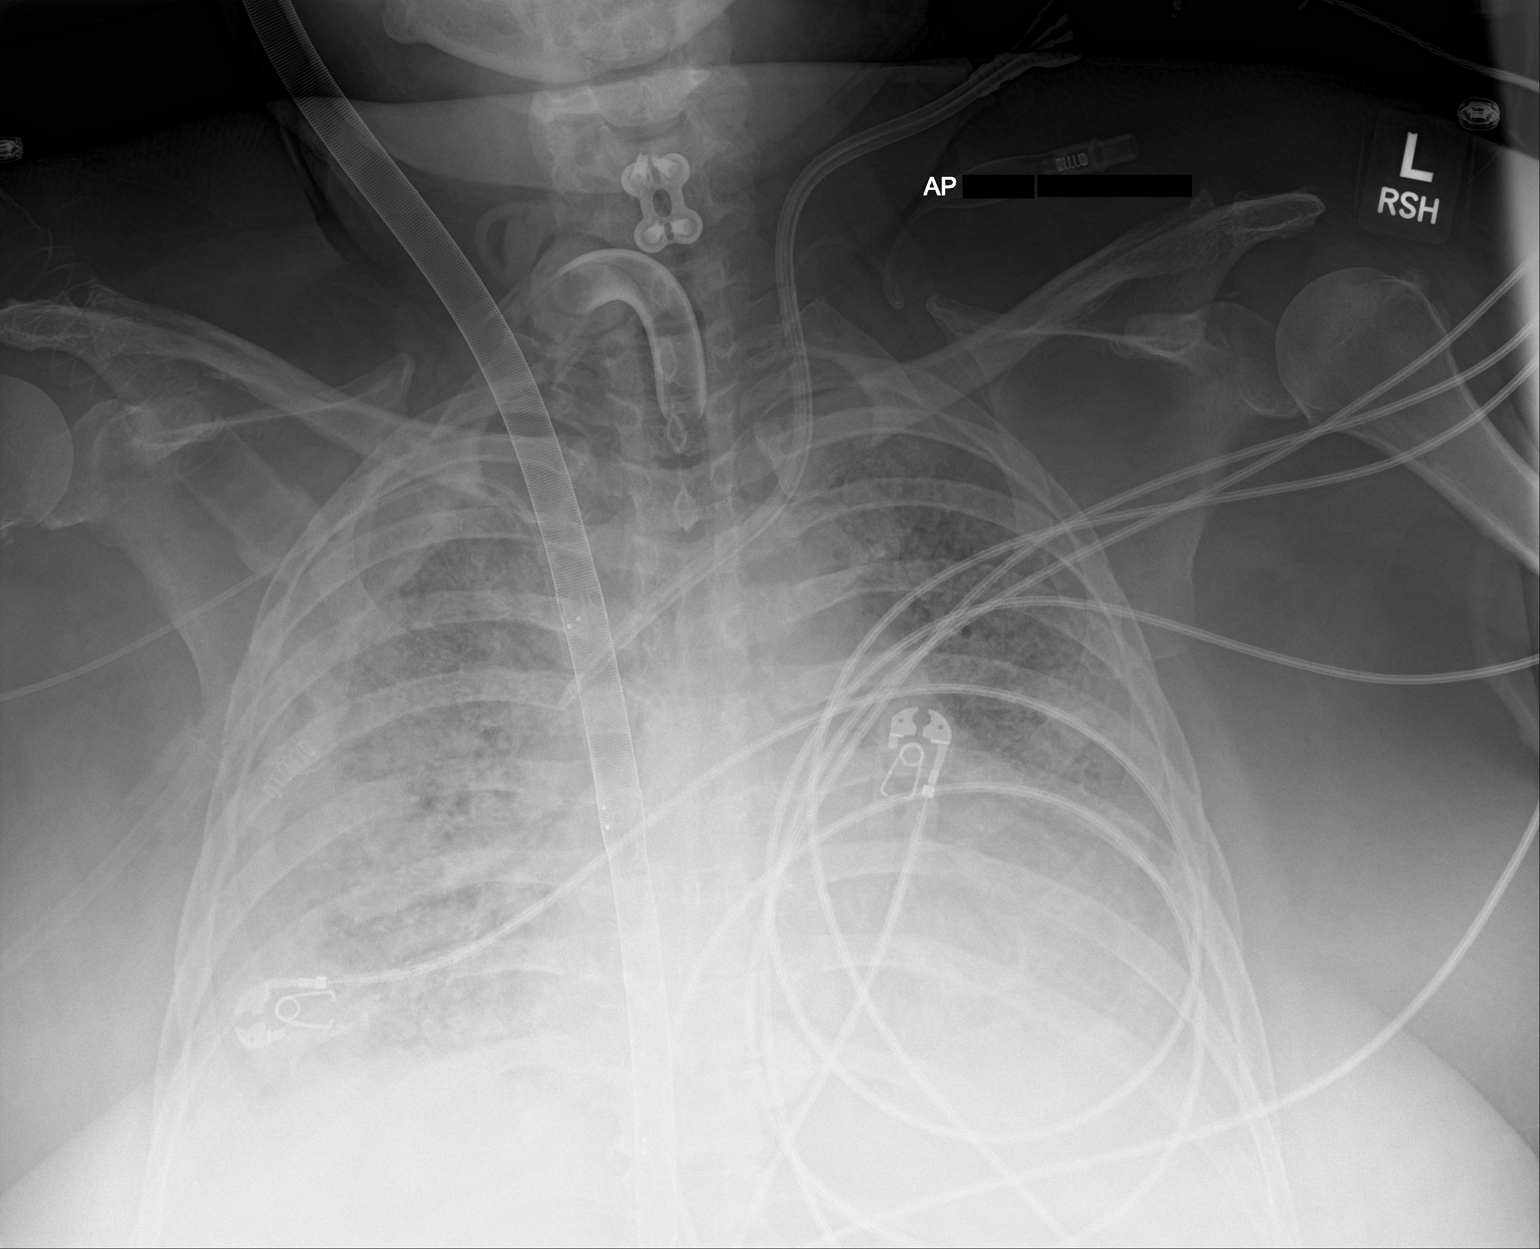

[1 of 1 positions shown; findings below may reference images not displayed]

FINDINGS: Tracheostomy, left dialysis catheter and ECMO device remain in
place, unchanged. Diffuse bilateral airspace disease. Probable
loculated right pleural effusion. Mild cardiomegaly. Right PICC line
remains in place. No significant change since prior study.
IMPRESSION: No interval change.

## 2021-10-21 IMAGING — DX DG CHEST 1V PORT
1 series · 1 of 1 positions shown · non-contrast
Comparison: 02/28/2020.

CLINICAL DATA: ARDS.  Tracheostomy.  ECMO.

EXAM:
PORTABLE CHEST 1 VIEW

[chest]
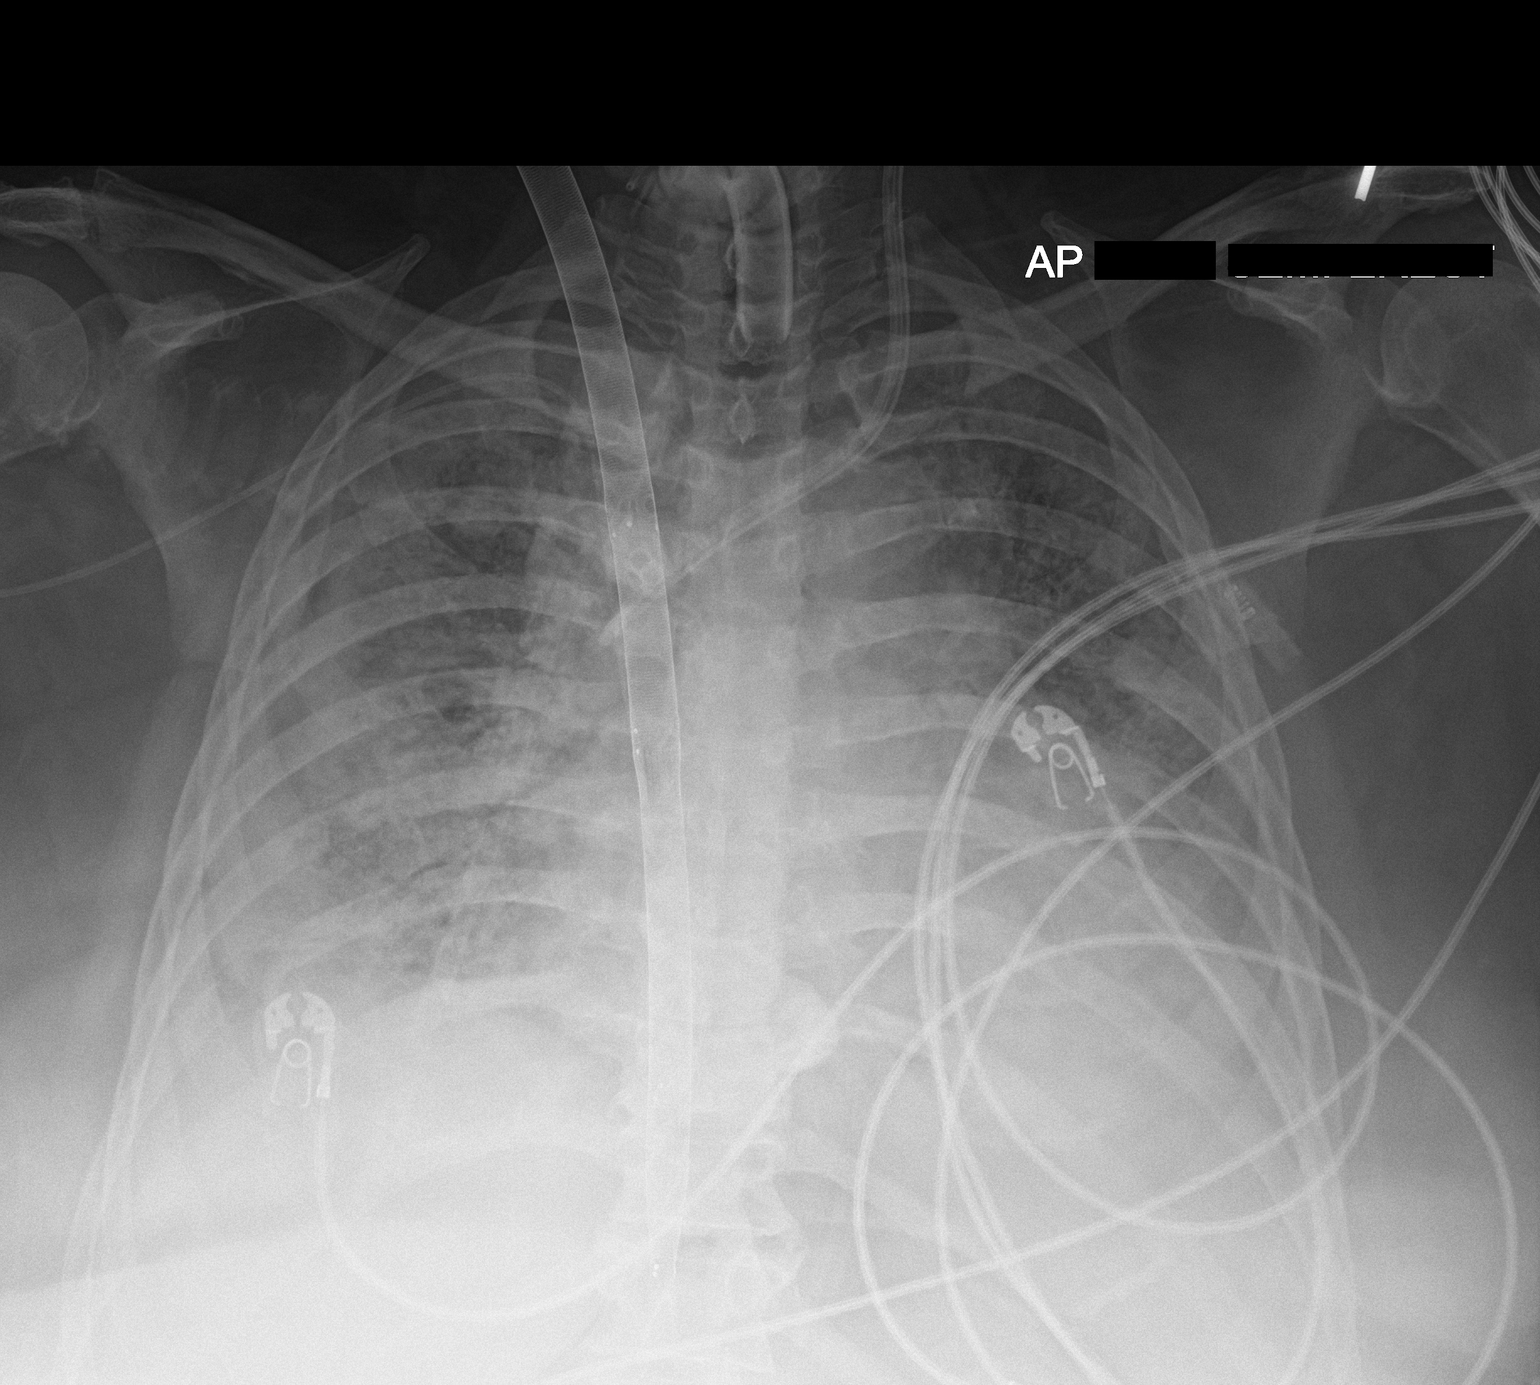

[1 of 1 positions shown; findings below may reference images not displayed]

FINDINGS: Tracheostomy tube, left IJ line, right PICC line, ECMO catheter
stable position. Stable cardiomegaly. Diffuse severe bilateral
pulmonary infiltrates again noted without interim change. No
prominent pleural effusion. No pneumothorax. Prior cervical spine
fusion.
IMPRESSION: 1.  Lines and tubes including ECMO device in stable position.

2.  Stable cardiomegaly.

3. Diffuse severe bilateral pulmonary infiltrates again noted
without interim change.

## 2021-10-22 IMAGING — DX DG CHEST 1V PORT
1 series · 1 of 1 positions shown · non-contrast
Comparison: February 29, 2020

CLINICAL DATA: Respiratory failure with ECMO treatment

EXAM:
PORTABLE CHEST 1 VIEW

[chest]
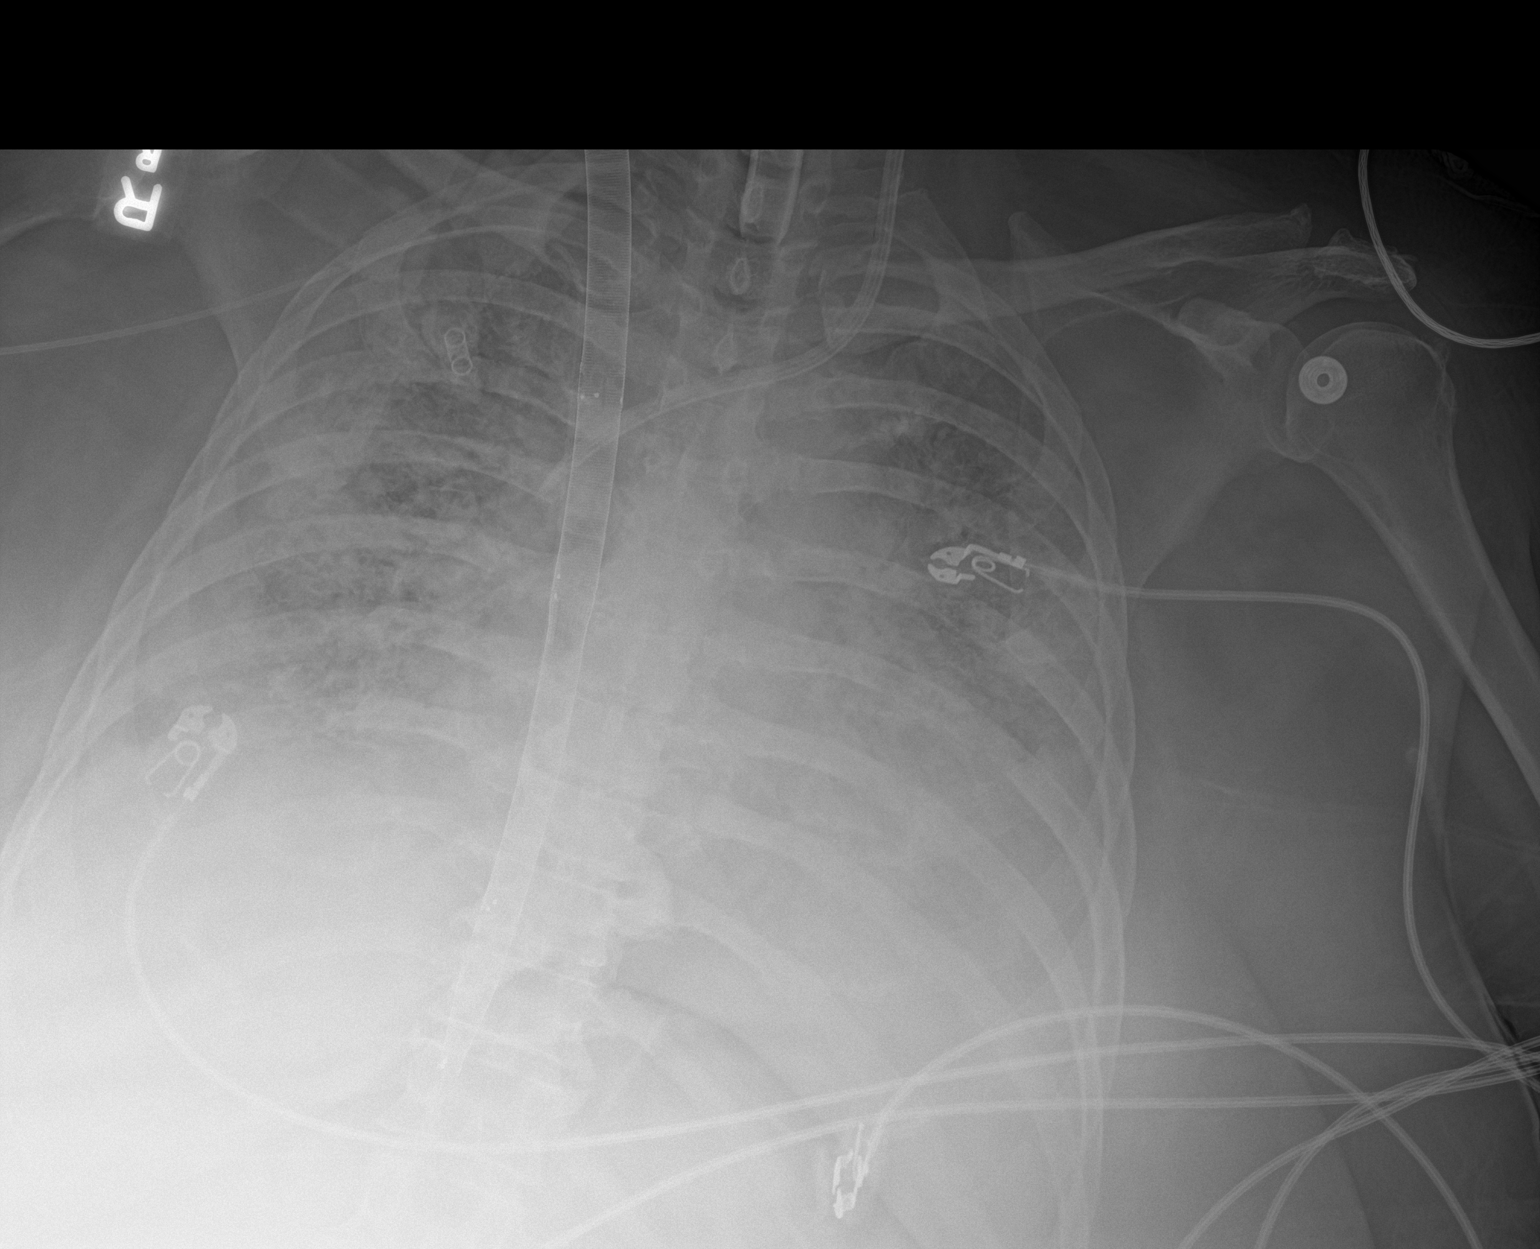

[1 of 1 positions shown; findings below may reference images not displayed]

FINDINGS: Tracheostomy catheter tip is 6.6 cm above the carina. ECMO catheter
tip in inferior vena cava. Left jugular catheter tip is in the
superior vena cava. No pneumothorax. Widespread airspace opacity
persists throughout the lungs without change. There is cardiomegaly,
stable. Pulmonary vascularity appears grossly within normal limits,
limited in visualization due to the overlying airspace
consolidation. No bone lesions.
IMPRESSION: Stable widespread airspace opacity bilaterally. Stable cardiac
prominence. Tube and catheter positions as described. No
pneumothorax evident.

## 2021-10-23 IMAGING — DX DG CHEST 1V PORT
1 series · 1 of 1 positions shown · non-contrast
Comparison: Chest radiograph March 01, 2020.

CLINICAL DATA: Patient with cardio respiratory failure.

EXAM:
PORTABLE CHEST 1 VIEW

[chest ap]
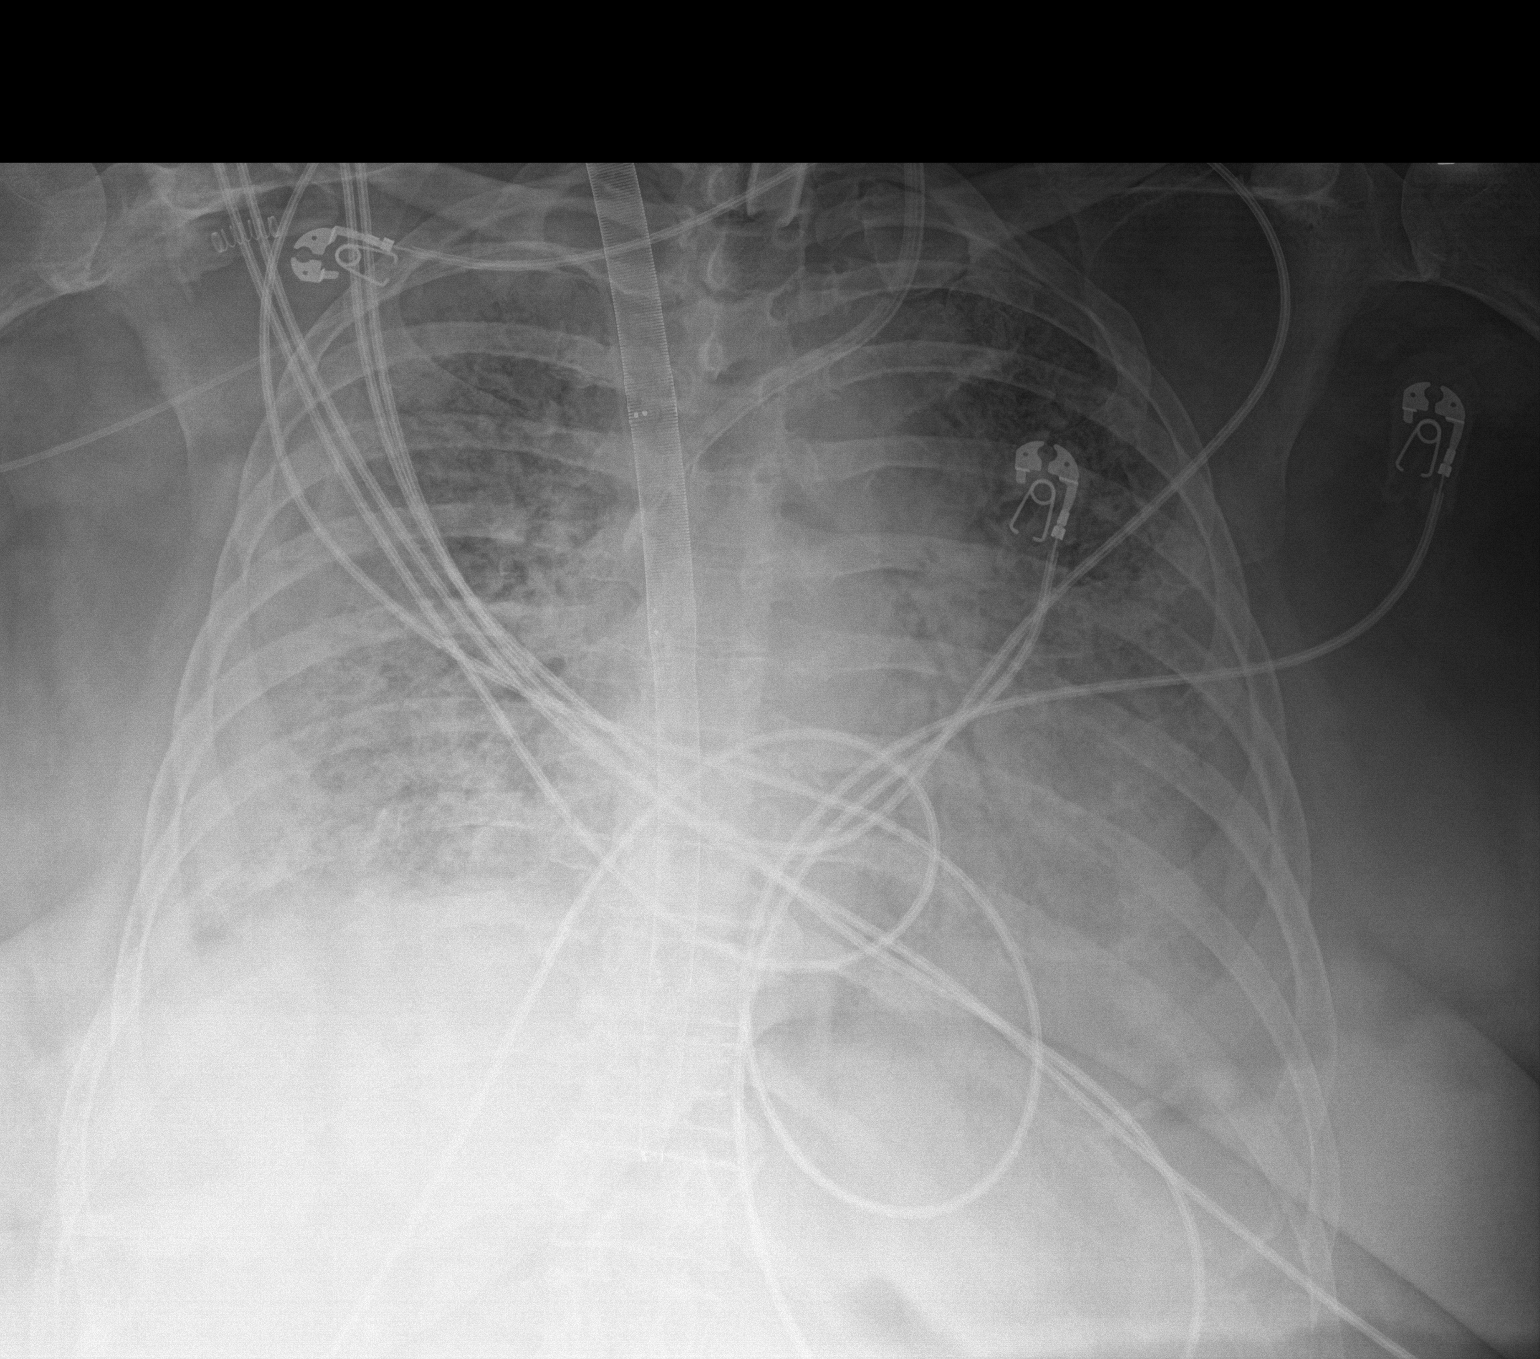

[1 of 1 positions shown; findings below may reference images not displayed]

FINDINGS: Tracheostomy tube terminates at the thoracic inlet. Left IJ central
venous catheter tip projects over the superior vena cava. ECMO
catheter projects at the inferior aspect of the vena cava. Right
upper extremity PICC line visualized to the superior vena cava.
Monitoring leads overlie the patient. Obscured cardiac and
mediastinal contours. Relatively unchanged diffuse bilateral
airspace opacities. Possible small bilateral pleural effusions. No
definite pneumothorax.
IMPRESSION: Similar-appearing diffuse bilateral airspace opacities. Stable
support apparatus.

## 2021-10-24 IMAGING — CT CT CHEST W/O CM
2 of 4 series · 15 of 36 positions shown, 18 images · non-contrast
Comparison: Plain film from earlier in the same day as well as
prior CT from 01/19/2020

CLINICAL DATA: ARDS, known [id number]

EXAM:
CT CHEST WITHOUT CONTRAST
TECHNIQUE: Multidetector CT imaging of the chest was performed following the
standard protocol without IV contrast.

[Series 3: lungs · axial · 0.70mm/px · z∈[-1024,-794]mm · 12 of 137 slices shown, 15 images]
[im 11/137  mediastinal]
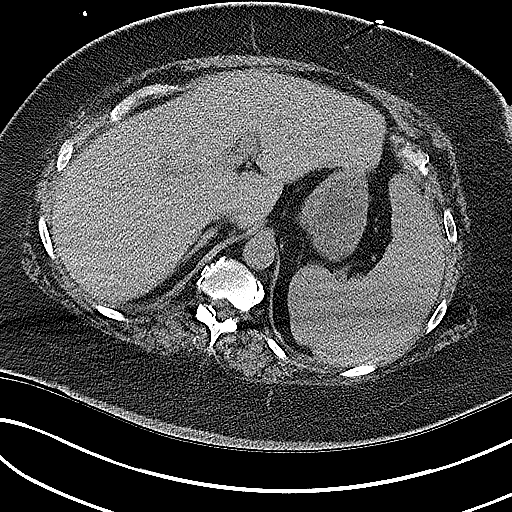
[im 11/137  lung]
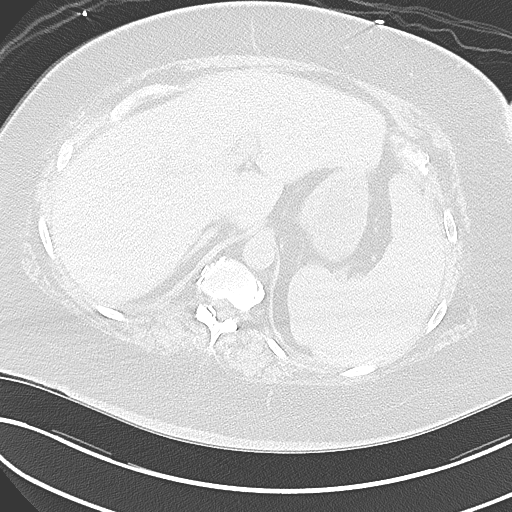
[im 21/137  lung]
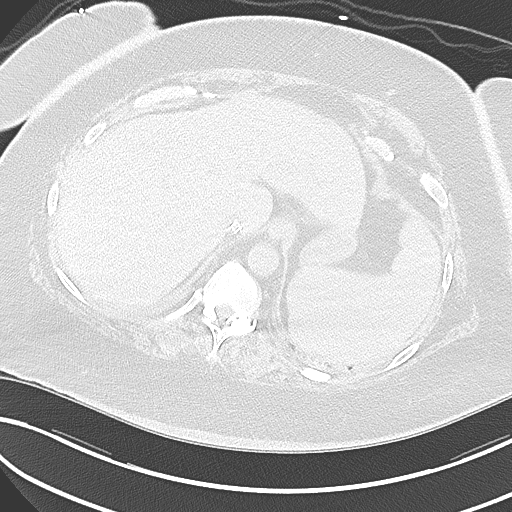
[im 32/137  lung]
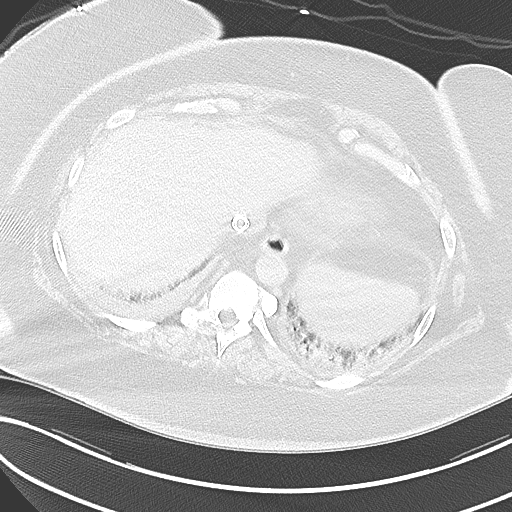
[im 42/137  lung]
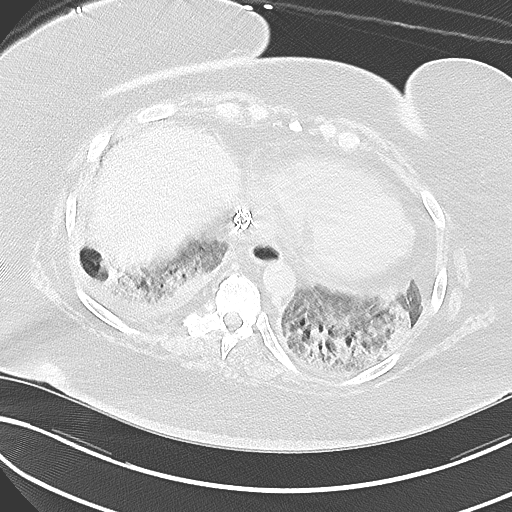
[im 53/137  mediastinal]
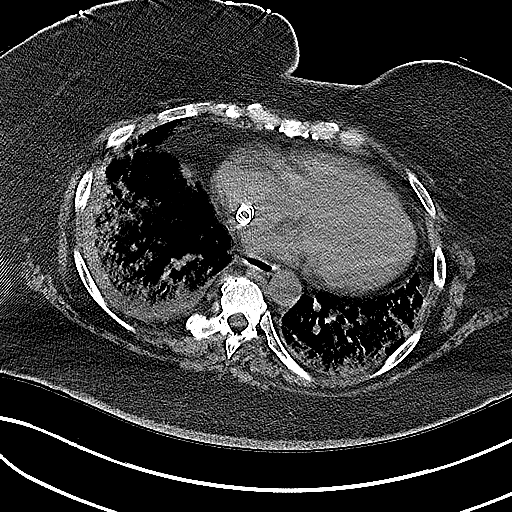
[im 53/137  lung]
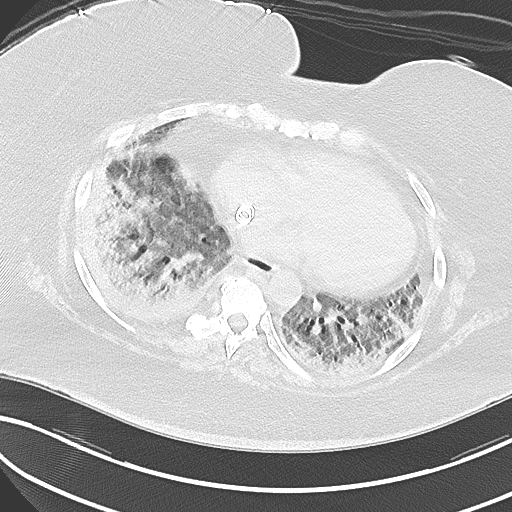
[im 63/137  lung]
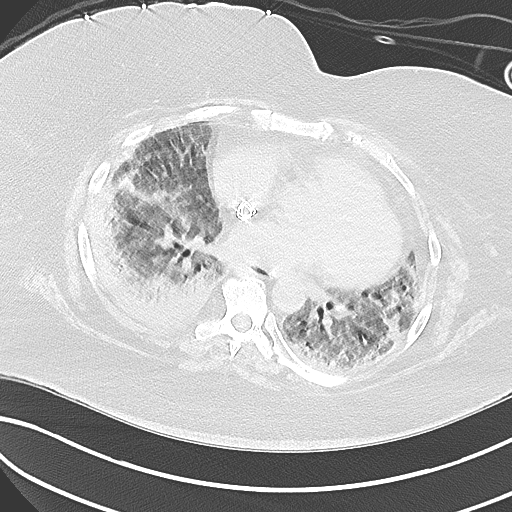
[im 74/137  lung]
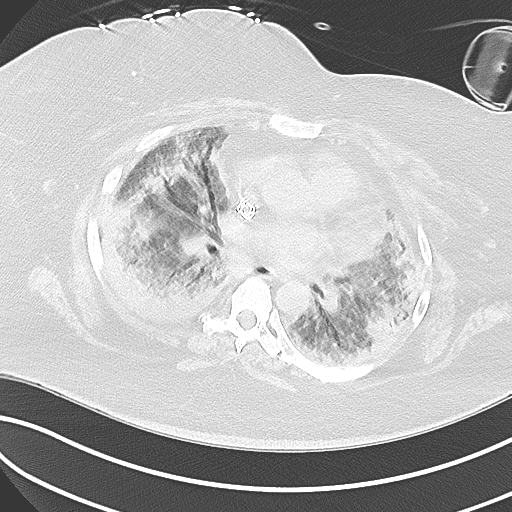
[im 84/137  lung]
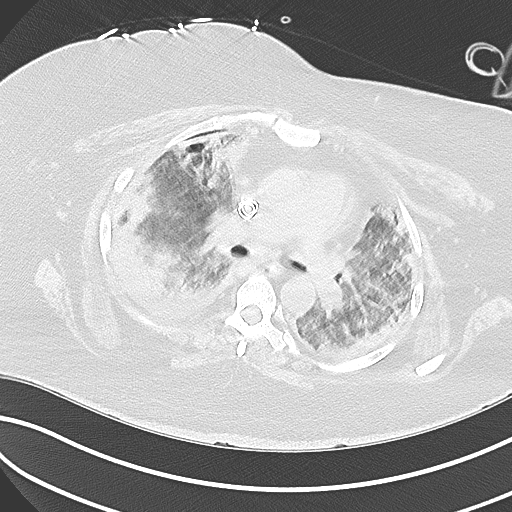
[im 95/137  mediastinal]
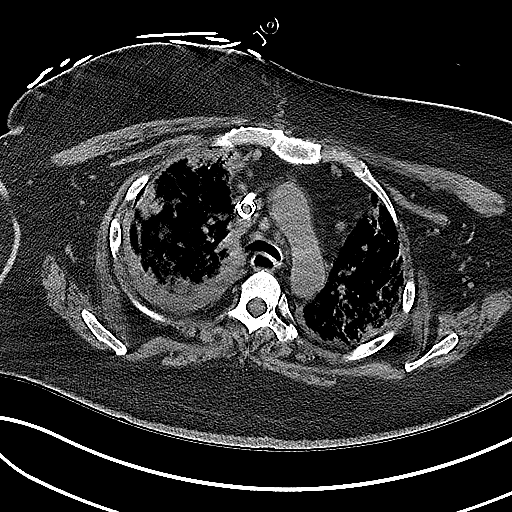
[im 95/137  lung]
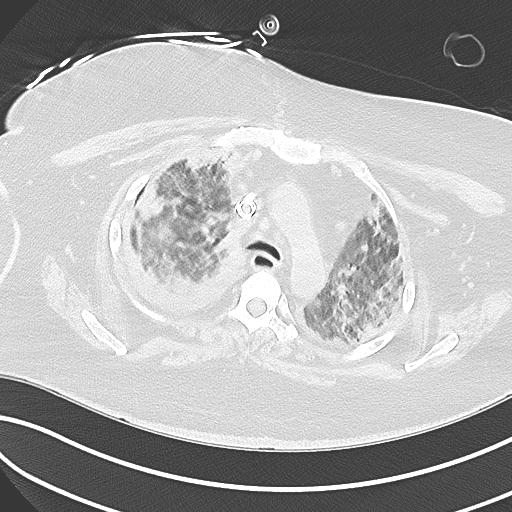
[im 105/137  lung]
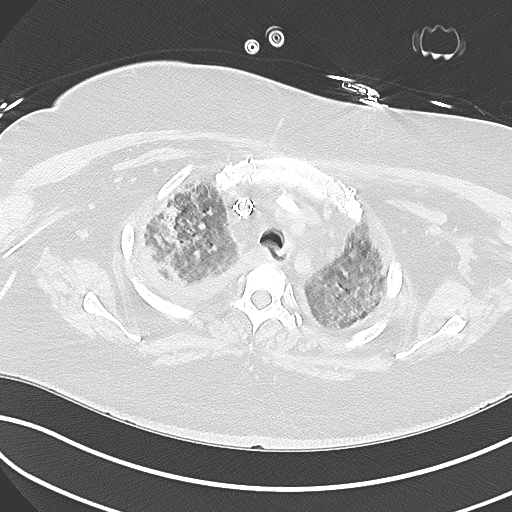
[im 116/137  lung]
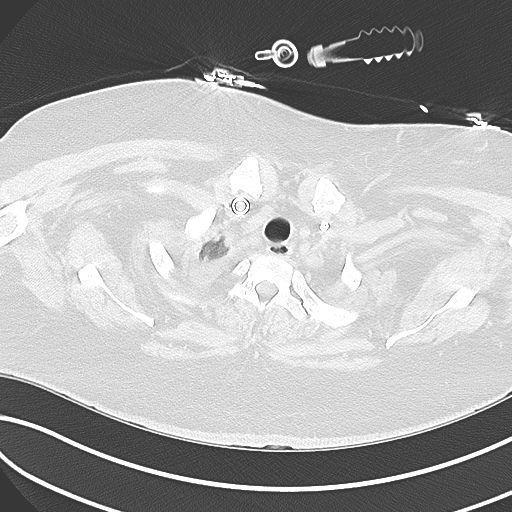
[im 126/137  lung]
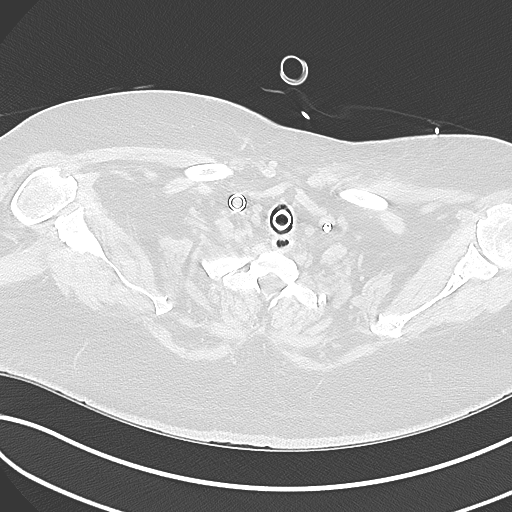

[Series 6: cor · coronal · 0.56mm/px · 3 of 111 slices shown]
[im 23/111  lung]
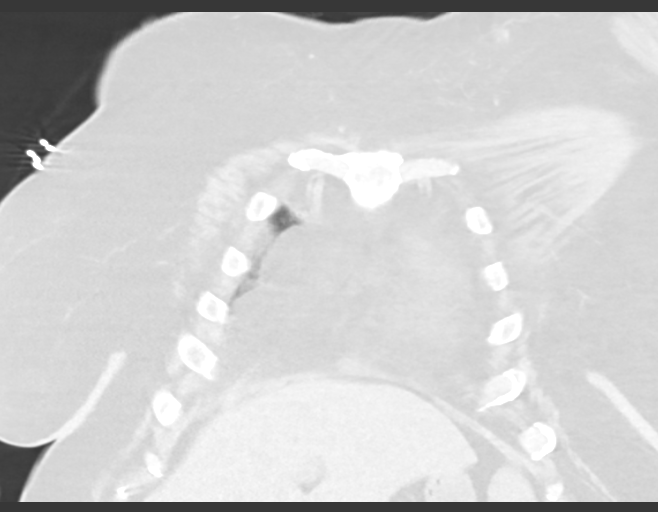
[im 45/111  lung]
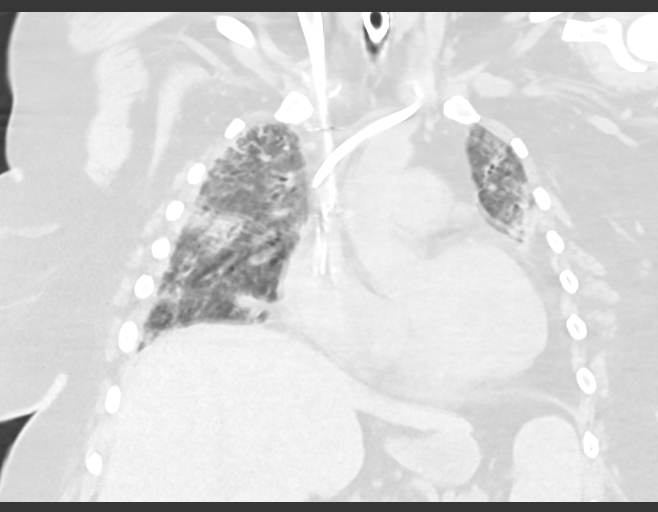
[im 67/111  lung]
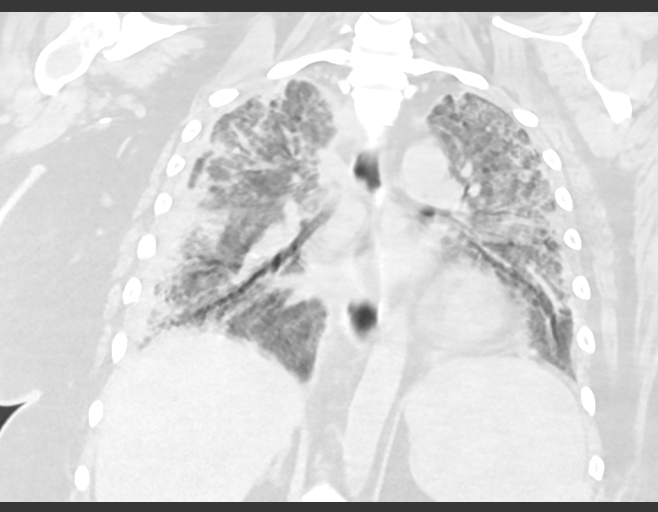

[15 of 36 positions shown; findings below may reference images not displayed]

FINDINGS: Cardiovascular: Thoracic aorta demonstrates no aneurysmal
dilatation. No significant cardiac enlargement is noted. ECMO
cannula is noted extending from the right jugular vein inferiorly to
the IVC. Right-sided PICC line is identified extending into the
superior vena cava. Left jugular temporary dialysis catheter is
noted. These are all stable from the recent chest x-ray.

Mediastinum/Nodes: Thoracic inlet is within normal limits.
Tracheostomy tube is noted. No sizable hilar or mediastinal
adenopathy is noted. The esophagus is within normal limits.

Lungs/Pleura: Small right-sided pleural effusion is noted. Diffuse
airspace opacities are again identified similar to that seen on
recent chest x-ray but slightly improved when compared with the
prior CT examination from 1 month ago. These changes are consistent
with sequelae from prior [id number] infection. Small right
pneumothorax is noted anteriorly.

Upper Abdomen: Visualized upper abdomen shows no acute abnormality.

Musculoskeletal: Degenerative changes of the thoracic spine are
noted.
IMPRESSION: Persistent bilateral airspace opacities similar to that seen on
recent chest x-ray but significantly improved when compared with the
prior CT from 01/19/2020. Small right-sided pleural effusion is
noted.

Tiny right anterior pneumothorax likely chronic in nature.

Tubes and lines as described above.

## 2021-10-25 IMAGING — CT CT HEAD W/O CM
4 series · 15 of 47 positions shown, 17 images · non-contrast
Comparison: Prior noncontrast head CT examinations 01/22/2020 and
earlier.

CLINICAL DATA: Neuro deficit, acute, stroke suspected. Additional
history provided:? Septic emboli

EXAM:
CT HEAD WITHOUT CONTRAST
TECHNIQUE: Contiguous axial images were obtained from the base of the skull
through the vertex without intravenous contrast.

[Series 3: head without · axial · non-contrast · 0.46mm/px · z∈[+1269,+1399]mm · 7 of 36 slices shown, 9 images]
[im 5/36  brain]
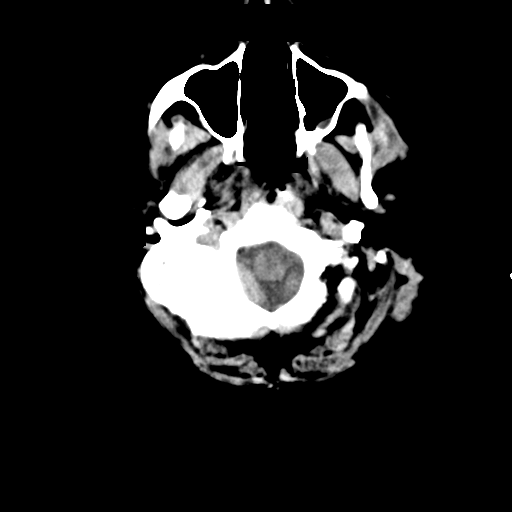
[im 5/36  bone]
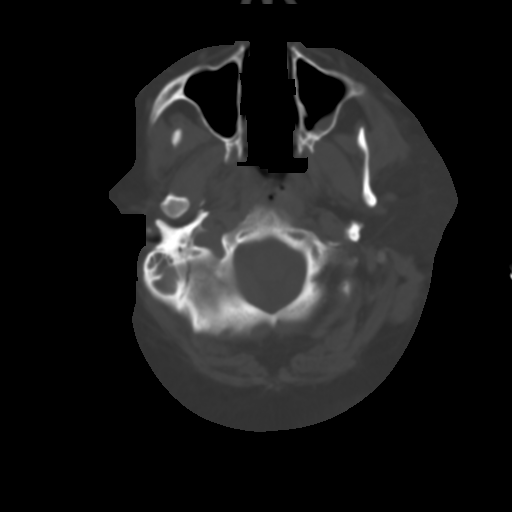
[im 9/36  brain]
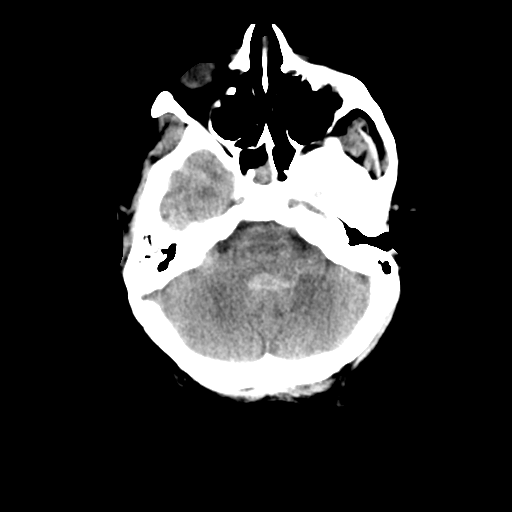
[im 14/36  brain]
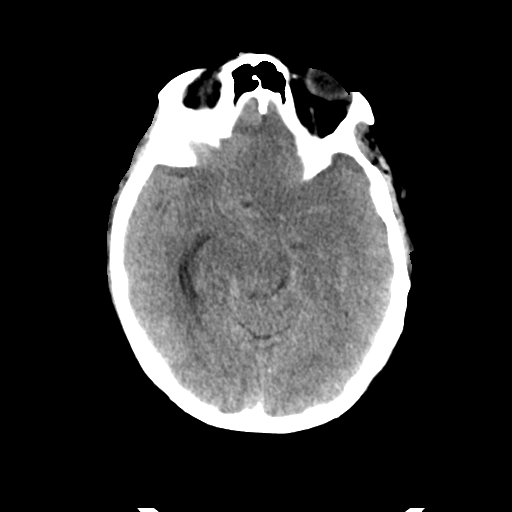
[im 18/36  brain]
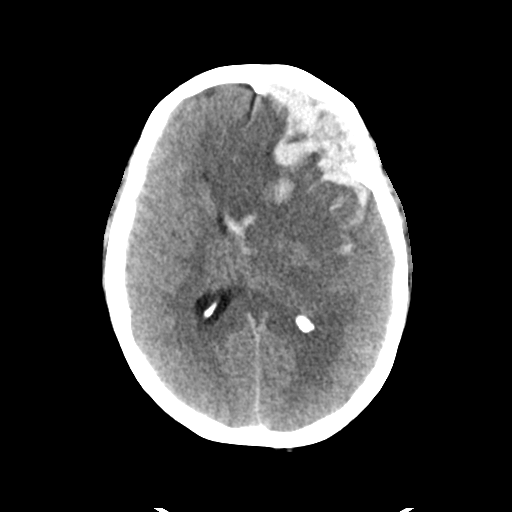
[im 22/36  brain]
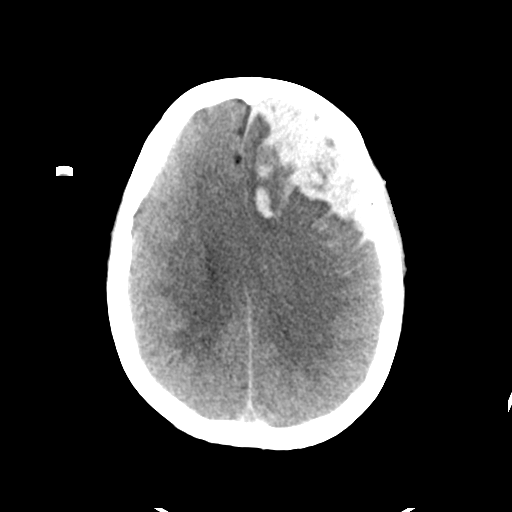
[im 22/36  bone]
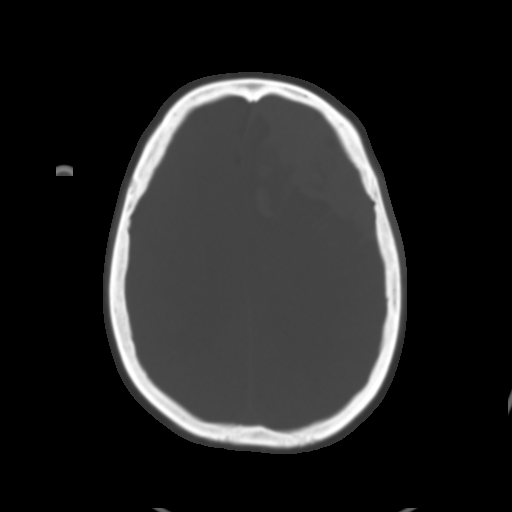
[im 27/36  brain]
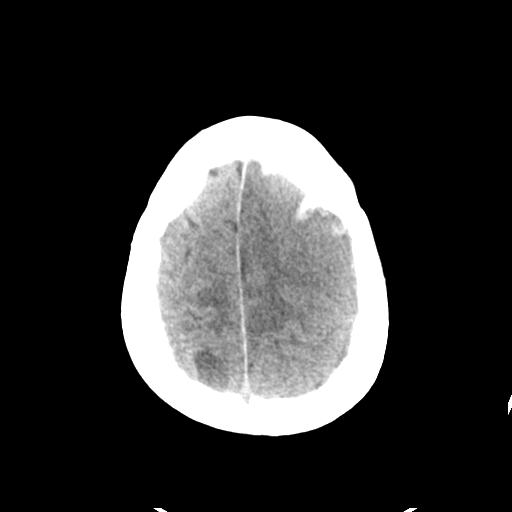
[im 31/36  brain]
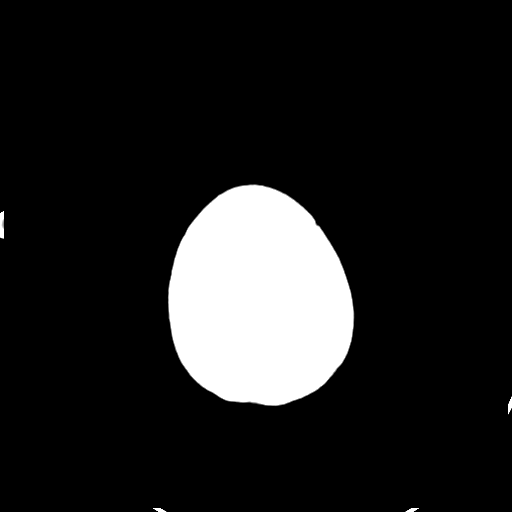

[Series 4: head bone · axial · 0.46mm/px · z∈[+1265,+1283]mm · 2 of 89 slices shown]
[im 9/89  bone]
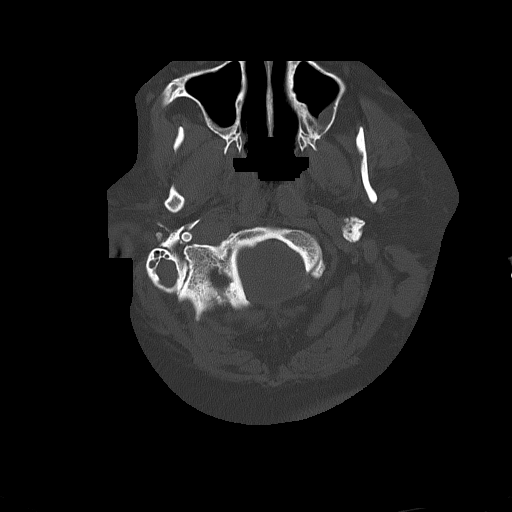
[im 18/89  bone]
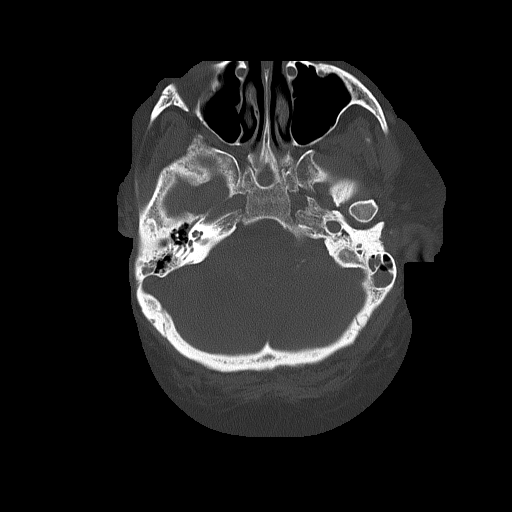

[Series 5: head without cor · coronal · non-contrast · 0.35mm/px · 3 of 75 slices shown]
[im 25/75  brain]
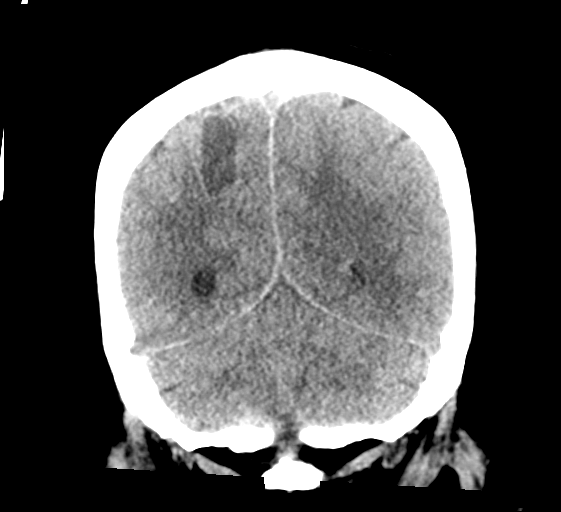
[im 33/75  brain]
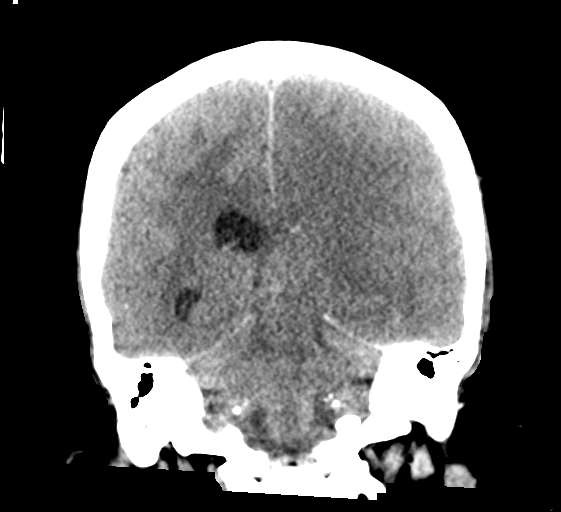
[im 42/75  brain]
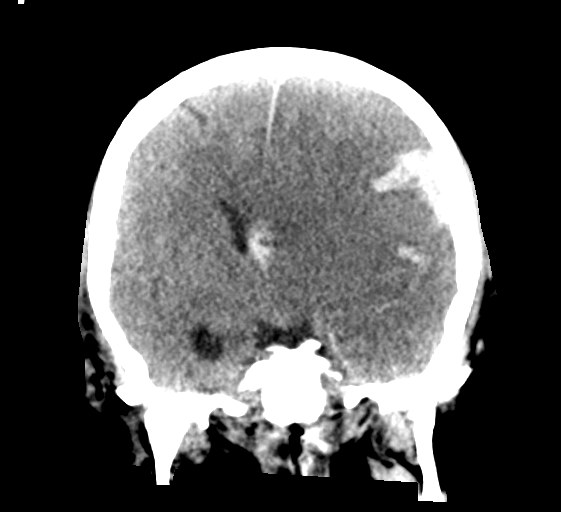

[Series 6: head without sag · sagittal · non-contrast · 0.35mm/px · 3 of 59 slices shown]
[im 20/59  brain]
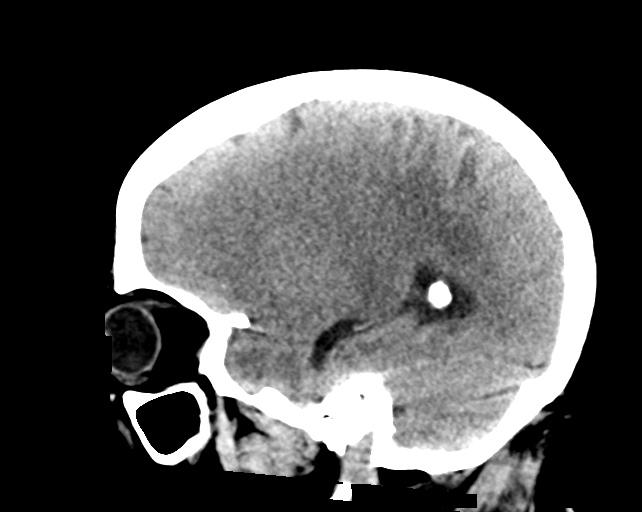
[im 30/59  brain]
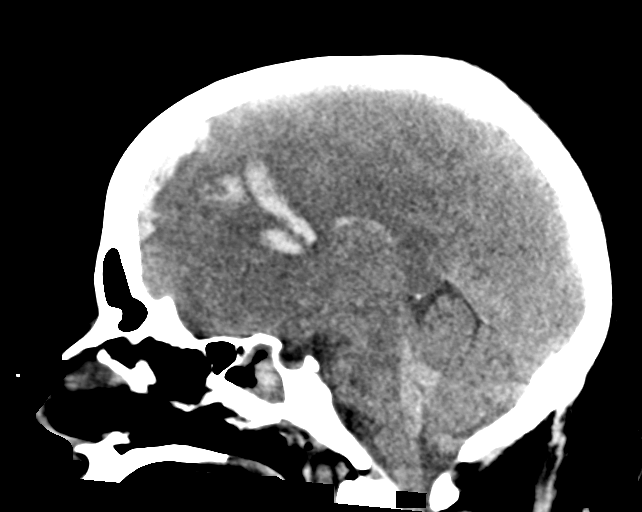
[im 39/59  brain]
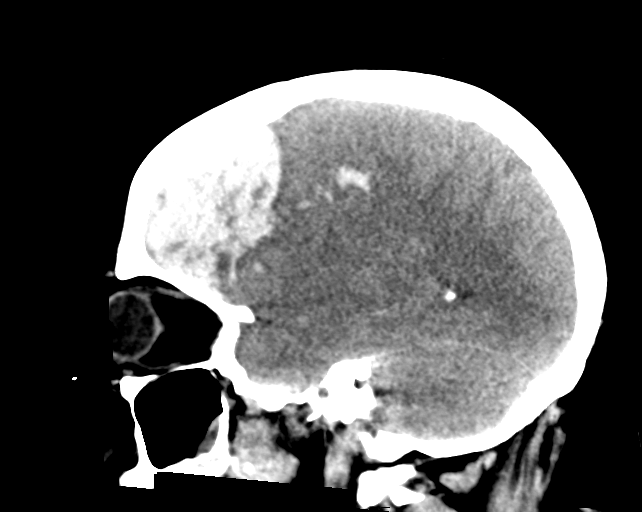

[15 of 47 positions shown; findings below may reference images not displayed]

FINDINGS: Brain:

New from the prior head CT of 01/22/2020, there is a very large
acute hemorrhage within the left frontal region with parenchymal and
extra-axial components. The hemorrhage measures 8.3 x 3.9 x 6.2 cm
(AP x TV x CC) (for instance as seen on series 3, image 24) (series
5, image 23). There is also intraventricular extension of hemorrhage
most notably within the left lateral, third and fourth ventricles.
Marked mass effect complete effacement of the left lateral
ventricle. Asymmetric prominence of the right lateral ventricle
consistent with entrapment. There is rightward subfalcine herniation
as well as left uncal herniation. Subarachnoid hemorrhage extends
into the left sylvian fissure.

The previous sites of parenchymal hemorrhage elsewhere within the
brain have resolved. An exception to this is a 3.1 cm cavity within
the right parietal lobe with a small amount of peripheral hemorrhage
(series 3, image 25).

Vascular: No hyperdense vessel is appreciated

Skull: Normal. Negative for fracture or focal lesion.

Sinuses/Orbits: Visualized orbits show no acute finding. Frothy
secretions and severe mucosal thickening within the right sphenoid
sinus. Mild left maxillary sinus mucosal thickening. Bilateral
mastoid effusions.

These results were called by telephone at the time of interpretation
on 03/04/2020 at [DATE] to provider TANIA GREMILLION , who verbally
acknowledged these results.
IMPRESSION: New from the prior head CT of 01/22/2020, there is a very large
acute hemorrhage within the left frontal region with parenchymal and
extra-axial components. The hemorrhage measures 8.3 x 3.9 x 6.2 cm
(AP x TV x CC). Intraventricular extension of hemorrhage most
notably within the left lateral, third and fourth ventricles.
Subarachnoid hemorrhage extends into the left sylvian fissure.

Marked mass effect with complete effacement of the left lateral
ventricle. Asymmetric prominence of the right lateral ventricle
consistent with entrapment. There is rightward subfalcine herniation
as well as left uncal herniation.

The sites of previous parenchymal hemorrhage elsewhere within the
brain have resolved. An exception to this is a 3.1 cm cavity within
the right parietal lobe with a small amount of residual peripheral
hemorrhage.

## 2021-10-25 IMAGING — DX DG CHEST 1V PORT
1 series · 1 of 1 positions shown · non-contrast
Comparison: CT 03/03/2020.  Chest x-ray 03/03/2020.

CLINICAL DATA: Tracheostomy.  ARDS.  ECMO.

EXAM:
PORTABLE CHEST 1 VIEW

[chest ap]
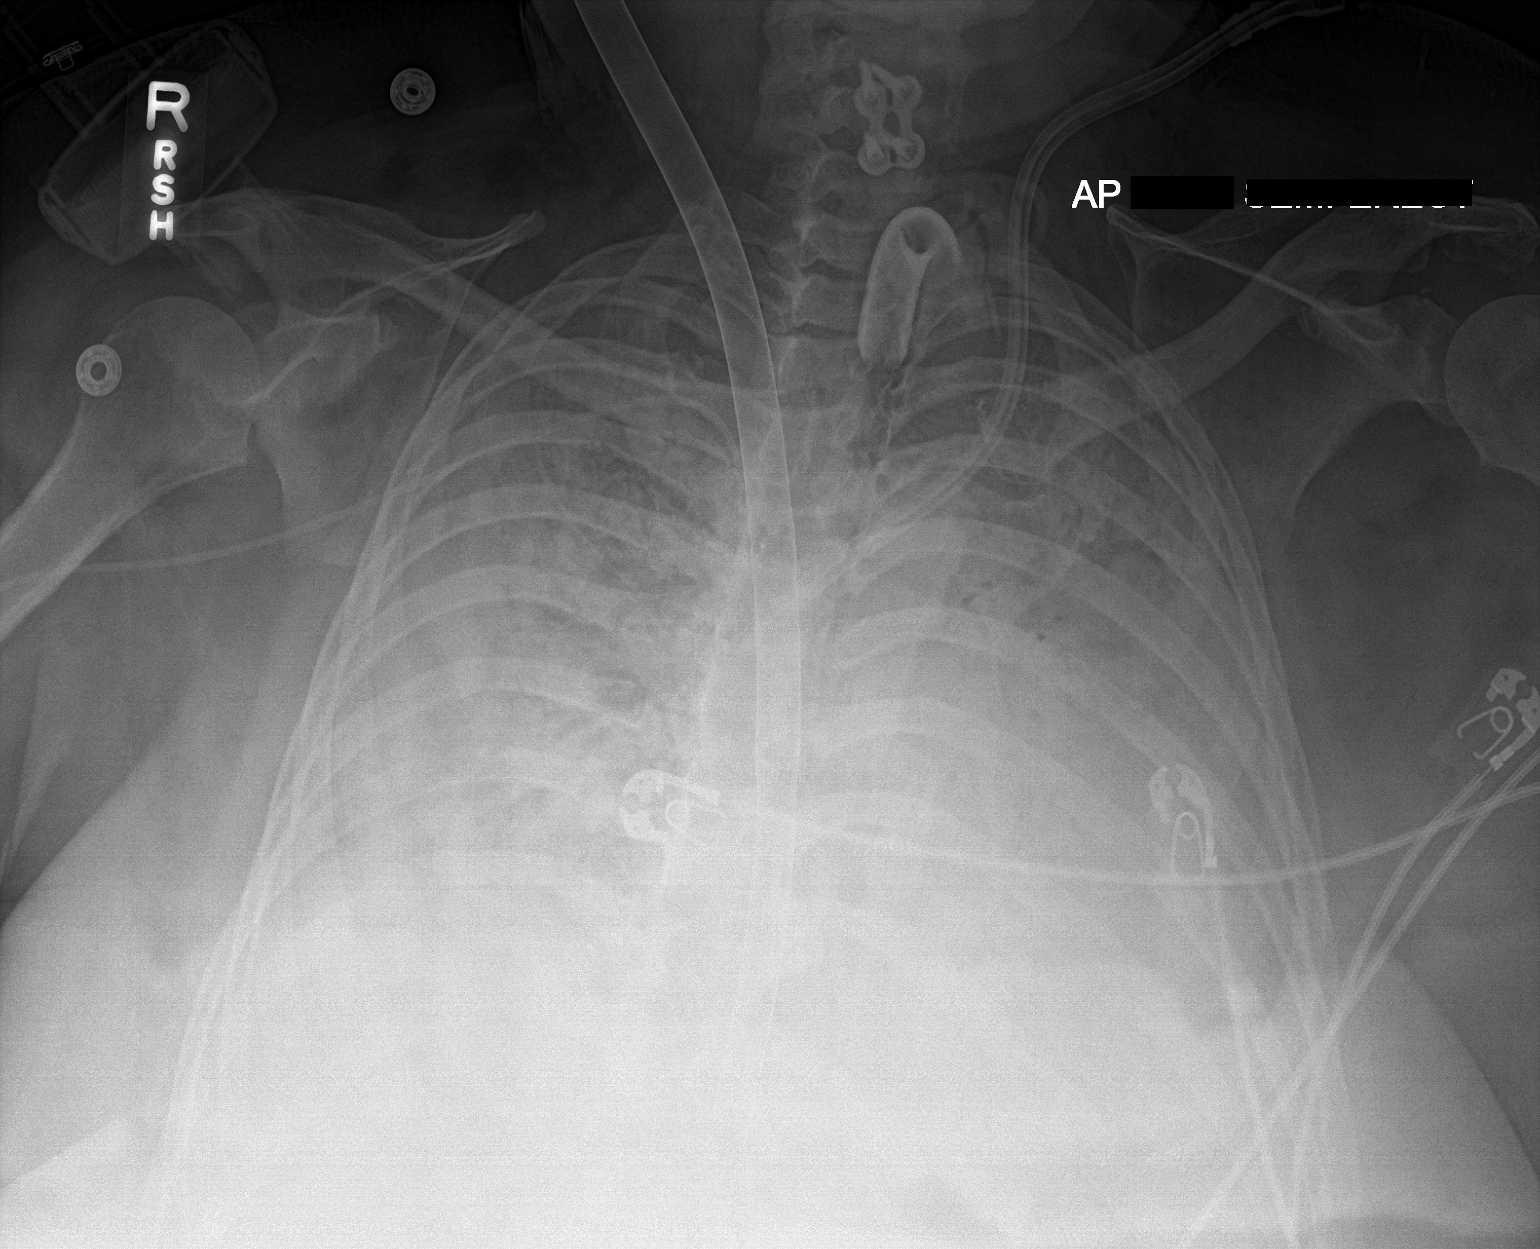

[1 of 1 positions shown; findings below may reference images not displayed]

FINDINGS: Tracheostomy tube, left IJ line, right PICC line, ECMO device in
stable position. Stable cardiomegaly. Diffuse dense bilateral
pulmonary infiltrates again noted. Right pleural effusion again
noted. Previously identified tiny right anterior pneumothorax not
identified by chest x-ray. Prior cervical spine fusion.
IMPRESSION: 1. Lines and tubes in stable position.
2. Stable cardiomegaly.
3. Diffuse dense bilateral pulmonary infiltrates again noted without
interim change. Right pleural effusion again noted.
4. Previously identified tiny right anterior pneumothorax not
identified by chest x-ray.
